# Patient Record
Sex: Male | Born: 1950 | Race: White | Hispanic: No | Smoking: Never smoker
Health system: Southern US, Community
[De-identification: ages and names within clinical notes are randomized; demographics above are authoritative.]

## PROBLEM LIST (undated history)

## (undated) DIAGNOSIS — I119 Hypertensive 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 heart failure: Secondary | ICD-10-CM

## (undated) DIAGNOSIS — D649 Anemia, unspecified: Secondary | ICD-10-CM

## (undated) DIAGNOSIS — T8859XA Other complications of anesthesia, initial encounter: Secondary | ICD-10-CM

## (undated) DIAGNOSIS — R112 Nausea with vomiting, unspecified: Secondary | ICD-10-CM

## (undated) DIAGNOSIS — N183 Chronic kidney disease, stage 3 (moderate): Secondary | ICD-10-CM

## (undated) DIAGNOSIS — I4891 Unspecified atrial fibrillation: Secondary | ICD-10-CM

## (undated) DIAGNOSIS — I219 Acute myocardial infarction, unspecified: Secondary | ICD-10-CM

## (undated) DIAGNOSIS — G629 Polyneuropathy, unspecified: Secondary | ICD-10-CM

## (undated) DIAGNOSIS — T4145XA Adverse effect of unspecified anesthetic, initial encounter: Secondary | ICD-10-CM

## (undated) DIAGNOSIS — E7800 Pure hypercholesterolemia, unspecified: Secondary | ICD-10-CM

## (undated) DIAGNOSIS — Z9889 Other specified postprocedural states: Secondary | ICD-10-CM

## (undated) DIAGNOSIS — R319 Hematuria, unspecified: Secondary | ICD-10-CM

## (undated) DIAGNOSIS — G459 Transient cerebral ischemic attack, unspecified: Secondary | ICD-10-CM

## (undated) DIAGNOSIS — Z9289 Personal history of other medical treatment: Secondary | ICD-10-CM

## (undated) DIAGNOSIS — Z86718 Personal history of other venous thrombosis and embolism: Secondary | ICD-10-CM

## (undated) DIAGNOSIS — I10 Essential (primary) hypertension: Secondary | ICD-10-CM

## (undated) HISTORY — DX: Hematuria, unspecified: R31.9

## (undated) HISTORY — PX: CATARACT EXTRACTION W/ INTRAOCULAR LENS IMPLANT: SHX1309

## (undated) HISTORY — PX: LUMBAR DISC SURGERY: SHX700

## (undated) HISTORY — PX: NASAL SEPTUM SURGERY: SHX37

## (undated) HISTORY — DX: Chronic kidney disease, stage 3 (moderate): N18.3

## (undated) HISTORY — PX: FRACTURE SURGERY: SHX138

## (undated) HISTORY — PX: COLONOSCOPY: SHX174

## (undated) HISTORY — DX: Polyneuropathy, unspecified: G62.9

## (undated) HISTORY — PX: ORIF TIBIA FRACTURE: SHX5416

## (undated) HISTORY — PX: BACK SURGERY: SHX140

## (undated) HISTORY — DX: Personal history of other venous thrombosis and embolism: Z86.718

## (undated) HISTORY — DX: Hypertensive 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 heart failure: I11.9

## (undated) HISTORY — PX: ORIF ULNAR FRACTURE: SHX5417

## (undated) SURGICAL SUPPLY — 2 items
SOLN 0.9% NACL 1000 ML (IV SOLUTION) ×1 IMPLANT
SOLN STERILE WATER 1000 ML (IV SOLUTION) ×1 IMPLANT

---
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
## 1998-05-31 ENCOUNTER — Inpatient Hospital Stay (HOSPITAL_COMMUNITY): Admission: EM | Admit: 1998-05-31 | Discharge: 1998-06-05 | Payer: Self-pay | Admitting: Emergency Medicine

## 1998-05-31 ENCOUNTER — Encounter: Payer: Self-pay | Admitting: General Surgery

## 1998-05-31 ENCOUNTER — Encounter: Payer: Self-pay | Admitting: Emergency Medicine

## 1998-06-01 ENCOUNTER — Encounter: Payer: Self-pay | Admitting: Physical Medicine & Rehabilitation

## 1998-06-01 ENCOUNTER — Encounter: Payer: Self-pay | Admitting: General Surgery

## 1998-06-02 ENCOUNTER — Encounter: Payer: Self-pay | Admitting: General Surgery

## 1998-06-03 ENCOUNTER — Encounter: Payer: Self-pay | Admitting: Physical Medicine & Rehabilitation

## 1998-06-05 ENCOUNTER — Inpatient Hospital Stay (HOSPITAL_COMMUNITY)
Admission: RE | Admit: 1998-06-05 | Discharge: 1998-06-12 | Payer: Self-pay | Admitting: Physical Medicine and Rehabilitation

## 2015-04-19 DIAGNOSIS — Z86718 Personal history of other venous thrombosis and embolism: Secondary | ICD-10-CM

## 2015-04-19 HISTORY — DX: Personal history of other venous thrombosis and embolism: Z86.718

## 2015-10-28 ENCOUNTER — Emergency Department (HOSPITAL_COMMUNITY)
Admission: EM | Admit: 2015-10-28 | Discharge: 2015-10-29 | Disposition: A | Discharge status: Home / Self Care | Payer: Self-pay | Attending: Emergency Medicine | Admitting: Emergency Medicine

## 2015-10-28 ENCOUNTER — Encounter (HOSPITAL_COMMUNITY): Payer: Self-pay | Admitting: Nurse Practitioner

## 2015-10-28 DIAGNOSIS — M7989 Other specified soft tissue disorders: Secondary | ICD-10-CM | POA: Insufficient documentation

## 2015-10-28 DIAGNOSIS — M79661 Pain in right lower leg: Secondary | ICD-10-CM

## 2015-10-28 MED ORDER — ENOXAPARIN SODIUM 80 MG/0.8ML ~~LOC~~ SOLN
1.0000 mg/kg | Freq: Once | SUBCUTANEOUS | Status: AC
  Administered 2015-10-29: 80 mg via SUBCUTANEOUS
  Filled 2015-10-28: qty 0.8

## 2015-10-28 NOTE — ED Notes (Addendum)
Pt c/o RLE swelling, pain, tightness behind his calf. He went to an urgent care and they sent him here to R/o DVT. He is alert, breathing easily, denies other complaints.

## 2015-10-28 NOTE — ED Provider Notes (Signed)
CSN: [id number]     Arrival date & time 10/28/15  1808 History   First MD Initiated Contact with Patient 10/28/15 2314     Chief Complaint  Patient presents with  . Leg Swelling     (Consider location/radiation/quality/duration/timing/severity/associated sxs/prior Treatment) HPI Comments: Patient presents today with pain and swelling of the right lower leg x 2 days.  Symptoms gradually worsening.  He has not taken anything for symptoms prior to arrival.  He denies any injury or trauma to the leg.  He was seen at [facility] earlier today and sent to the ED due to concern for DVT.  He denies any prior history of DVT or PE.  Denies prolonged travel or surgeries in the past 4 weeks.  Denies chest pain, SOB, hemoptysis, fever, or chills.     The history is provided by the patient.    No past medical history on file. No past surgical history on file. No family history on file. Social History  Substance Use Topics  . Smoking status: Never Smoker   . Smokeless tobacco: Not on file  . Alcohol Use: No    Review of Systems  All other systems reviewed and are negative.     Allergies  Review of patient's allergies indicates no known allergies.  Home Medications   Prior to Admission medications   Not on File   BP 160/84 mmHg  Pulse 79  Temp(Src) 98.5 F (36.9 C) (Oral)  Resp 18  Ht 6\' 2"  (1.88 m)  Wt 81.647 kg  BMI 23.10 kg/m2  SpO2 99% Physical Exam  Constitutional: He appears well-developed and well-nourished.  HENT:  Head: Normocephalic and atraumatic.  Neck: Normal range of motion. Neck supple.  Cardiovascular: Normal rate, regular rhythm and normal heart sounds.   Pulses:      Dorsalis pedis pulses are 2+ on the right side, and 2+ on the left side.  Pulmonary/Chest: Effort normal and breath sounds normal.  Musculoskeletal: Normal range of motion.  RLE swelling and tenderness to palpation of the right calf Positive Homan's sign on the right. No erythema or warmth of the RLE   Neurological: He is alert.  Skin: Skin is warm and dry.  Psychiatric: He has a normal mood and affect.  Nursing note and vitals reviewed.   ED Course  Procedures (including critical care time) Labs Review Labs Reviewed - No data to display  Imaging Review No results found. I have personally reviewed and evaluated these images and lab results as part of my medical decision-making.   EKG Interpretation None      MDM   Final diagnoses:  None   Patient presents today with RLE pain and swelling.  No acute injury or trauma.  No signs of infection.  Neurovascularly intact.  Unable to get a Doppler ultrasound to rule out a DVT at this time due to the ultrasound tech being gone for the night.  Doppler ultrasound ordered for the morning.  Patient given IM Lovenox.  He denies CP or SOB.  VSS.  Stable for discharge.  Return precautions given.     Hyman Bible, PA-C [id number]  Delora Fuel, MD [id number]

## 2015-10-29 ENCOUNTER — Ambulatory Visit (HOSPITAL_COMMUNITY)
Admission: RE | Admit: 2015-10-29 | Discharge: 2015-10-29 | Disposition: A | Discharge status: Home / Self Care | Payer: Self-pay | Source: Ambulatory Visit | Attending: Physician Assistant | Admitting: Physician Assistant

## 2015-10-29 ENCOUNTER — Encounter (HOSPITAL_COMMUNITY): Payer: Self-pay

## 2015-10-29 ENCOUNTER — Other Ambulatory Visit (HOSPITAL_COMMUNITY): Payer: Self-pay | Admitting: Emergency Medicine

## 2015-10-29 ENCOUNTER — Emergency Department (HOSPITAL_COMMUNITY)
Admission: EM | Admit: 2015-10-29 | Discharge: 2015-10-29 | Disposition: A | Discharge status: Home / Self Care | Payer: Self-pay | Attending: Emergency Medicine | Admitting: Emergency Medicine

## 2015-10-29 DIAGNOSIS — I82401 Acute embolism and thrombosis of unspecified deep veins of right lower extremity: Secondary | ICD-10-CM | POA: Insufficient documentation

## 2015-10-29 DIAGNOSIS — M7989 Other specified soft tissue disorders: Principal | ICD-10-CM

## 2015-10-29 DIAGNOSIS — M79604 Pain in right leg: Secondary | ICD-10-CM | POA: Insufficient documentation

## 2015-10-29 DIAGNOSIS — M79601 Pain in right arm: Secondary | ICD-10-CM

## 2015-10-29 DIAGNOSIS — N179 Acute kidney failure, unspecified: Secondary | ICD-10-CM | POA: Insufficient documentation

## 2015-10-29 LAB — BASIC METABOLIC PANEL
ANION GAP: 12 (ref 5–15)
BUN: 29 mg/dL — AB (ref 6–20)
CO2: 15 mmol/L — ABNORMAL LOW (ref 22–32)
Calcium: 8.8 mg/dL — ABNORMAL LOW (ref 8.9–10.3)
Chloride: 116 mmol/L — ABNORMAL HIGH (ref 101–111)
Creatinine, Ser: 1.89 mg/dL — ABNORMAL HIGH (ref 0.61–1.24)
GFR calc Af Amer: 42 mL/min — ABNORMAL LOW (ref >60)
GFR, EST NON AFRICAN AMERICAN: 36 mL/min — AB (ref >60)
Glucose, Bld: 113 mg/dL — ABNORMAL HIGH (ref 65–99)
POTASSIUM: 3.6 mmol/L (ref 3.5–5.1)
SODIUM: 143 mmol/L (ref 135–145)

## 2015-10-29 LAB — CBC WITH DIFFERENTIAL/PLATELET
BASOS ABS: 0.1 10*3/uL (ref 0.0–0.1)
Basophils Relative: 1 %
EOS ABS: 0.3 10*3/uL (ref 0.0–0.7)
EOS PCT: 3 %
HCT: 41.4 % (ref 39.0–52.0)
Hemoglobin: 13.9 g/dL (ref 13.0–17.0)
Lymphocytes Relative: 18 %
Lymphs Abs: 1.4 10*3/uL (ref 0.7–4.0)
MCH: 29.7 pg (ref 26.0–34.0)
MCHC: 33.6 g/dL (ref 30.0–36.0)
MCV: 88.5 fL (ref 78.0–100.0)
Monocytes Absolute: 0.5 10*3/uL (ref 0.1–1.0)
Monocytes Relative: 6 %
Neutro Abs: 5.8 10*3/uL (ref 1.7–7.7)
Neutrophils Relative %: 72 %
PLATELETS: 197 10*3/uL (ref 150–400)
RBC: 4.68 MIL/uL (ref 4.22–5.81)
RDW: 14.3 % (ref 11.5–15.5)
WBC: 8.1 10*3/uL (ref 4.0–10.5)

## 2015-10-29 MED ORDER — APIXABAN 5 MG PO TABS: 5.0000 mg | ORAL_TABLET | Freq: Two times a day (BID) | ORAL | Status: DC

## 2015-10-29 MED ORDER — APIXABAN 5 MG PO TABS
10.0000 mg | ORAL_TABLET | Freq: Two times a day (BID) | ORAL | Status: DC
  Administered 2015-10-29: 10 mg via ORAL
  Filled 2015-10-29: qty 2

## 2015-10-29 MED ORDER — SODIUM CHLORIDE 0.9 % IV BOLUS (SEPSIS)
1000.0000 mL | Freq: Once | INTRAVENOUS | Status: AC
  Administered 2015-10-29: 1000 mL via INTRAVENOUS

## 2015-10-29 MED ORDER — APIXABAN 5 MG PO TABS
10.0000 mg | ORAL_TABLET | Freq: Two times a day (BID) | ORAL | Status: DC
  Filled 2015-10-29: qty 2

## 2015-10-29 NOTE — Discharge Planning (Signed)
Case Management Note  Patient Details  Name: Billy [name] MRN: [id number] Date of Birth: 03/11/1951  Subjective/Objective:                  65 yo pt presents today for a DVT. He was here yesterday for leg swelling and pain. He came back today and received an ultrasound to confirm a DVT. /Bounces between mother and sister house.  Action/Plan: Follow for disposition needs. /Medication assistance.   Expected Discharge Date:  10/29/15               Expected Discharge Plan:  Home/Self Care  In-House Referral:  NA  Discharge planning Services  CM Consult, Follow-up appt scheduled, GCCN / P4HM (established/new)  Post Acute Care Choice:  NA Choice offered to:  Patient  DME Arranged:  N/A DME Agency:  NA  HH Arranged:  NA HH Agency:  NA  Status of Service:  Completed, signed off  If discussed at [place] of Stay Meetings, dates discussed:    Additional Comments: EDCM consulted to regarding medication assistance for Eliquis.  Pt is not working and is currently helping sister take care of mother.  Avid runner who until recently has not had any health issues.  EDCM presented pt with brochure with 30 day free card and refill assistance card intact for Eliquis. Pt will have upcoming appointment at [facility] on 8/4 at 1130.    Fuller Mandril, RN 10/29/2015, 11:23 AM

## 2015-10-29 NOTE — Discharge Instructions (Addendum)
Please follow up with vascular surgeon next week for further evaluation and management of your blood clot in your right leg. Find a primary care provider by using resources provided for you.  Make sure to take your medication as prescribed.  Return if you developed chest pain, shortness of breath or if you have other concerns.      Information on my medicine - ELIQUIS (apixaban)  This medication education was reviewed with me or my healthcare representative as part of my discharge preparation.    Why was Eliquis prescribed for you? Eliquis was prescribed to treat blood clots that may have been found in the veins of your legs (deep vein thrombosis) or in your lungs (pulmonary embolism) and to reduce the risk of them occurring again.  What do You need to know about Eliquis ? The starting dose is 10 mg (two 5 mg tablets) taken TWICE daily for the FIRST SEVEN (7) DAYS, then on 7/20  the dose is reduced to ONE 5 mg tablet taken TWICE daily.  Eliquis may be taken with or without food.   Try to take the dose about the same time in the morning and in the evening. If you have difficulty swallowing the tablet whole please discuss with your pharmacist how to take the medication safely.  Take Eliquis exactly as prescribed and DO NOT stop taking Eliquis without talking to the doctor who prescribed the medication.  Stopping may increase your risk of developing a new blood clot.  Refill your prescription before you run out.  After discharge, you should have regular check-up appointments with your healthcare provider that is prescribing your Eliquis.    What do you do if you miss a dose? If a dose of ELIQUIS is not taken at the scheduled time, take it as soon as possible on the same day and twice-daily administration should be resumed. The dose should not be doubled to make up for a missed dose.  Important Safety Information A possible side effect of Eliquis is bleeding. You should call your  healthcare provider right away if you experience any of the following: ? Bleeding from an injury or your nose that does not stop. ? Unusual colored urine (red or dark brown) or unusual colored stools (red or black). ? Unusual bruising for unknown reasons. ? A serious fall or if you hit your head (even if there is no bleeding).  Some medicines may interact with Eliquis and might increase your risk of bleeding or clotting while on Eliquis. To help avoid this, consult your healthcare provider or pharmacist prior to using any new prescription or non-prescription medications, including herbals, vitamins, non-steroidal anti-inflammatory drugs (NSAIDs) and supplements.  This website has more information on Eliquis (apixaban): http://www.eliquis.com/eliquis/home   Venous Thromboembolism Venous thromboembolism (VTE) is a condition in which a blood clot (thrombus) develops in the body. A thrombus usually occurs in a deep vein in the leg or the pelvis, but it can also occur in the arm. Sometimes, pieces of a thrombus can break off from its original place of development and travel through the bloodstream to other parts of the body. When that happens, the thrombus is called an embolism. An embolism can block the blood flow in the blood vessels of other organs. There are two serious types of VTE:  Deep vein thrombosis (DVT). A DVT is a thrombus that usually occurs in a deep, larger vein of the lower leg or the pelvis, or in an upper extremity such as the arm.  Pulmonary embolism (PE). A PE occurs when an embolism has formed and traveled to the lungs. A PE can block or decrease the blood flow in one lung or both lungs. VTE is a serious health condition that can cause disability or death. It is very important to get help right away and to not ignore symptoms. CAUSES VTE is caused by the formation of a blood clot in your leg, pelvis, or arm. Usually, several things contribute to the formation of blood clots. A  clot may develop when:  Your blood flow slows down.  Your vein becomes damaged in some way.  You have a condition that makes your blood clot more easily. RISK FACTORS A VTE is more likely to develop in:  People who are older, especially over 18 years of age.  People who are overweight (obese).  People who sit or lie still for a long time, such as during long-distance travel (over 4 hours), bed rest, hospitalization, or during recovery from certain medical conditions like a stroke.  People who do not engage in much physical activity (sedentary lifestyle).  People who have chronic breathing disorders.  People who have a personal or family history of blood clots or blood clotting disease.  People who have peripheral vascular disease (PVD), diabetes, or some types of cancer.  People who have heart disease, especially if the person had a recent heart attack or has congestive heart failure.  People who have neurological diseases that affect the legs (leg paresis).  People who have had a traumatic injury, such as breaking a hip or leg.  People who have recently had major or lengthy surgery, especially on the hip, knee, or abdomen.  People who have had a central line placed inside a large vein.  People who take medicines that contain the hormone estrogen. These include birth control pills and hormone replacement therapy.  Pregnancy or during childbirth or the postpartum period.  Long plane flights (over 8 hours). SIGNS AND SYMPTOMS  Symptoms of VTE can depend on where the clot is located and whether the clot breaks off and travels to another organ. Sometimes, there may be no symptoms. Symptoms of a DVT can include:  Swelling of your leg or arm, especially if one side is much worse.  Warmth and redness of your leg or arm, especially if one side is much worse.  Pain in your arm or leg. If the clot is in your leg, symptoms may be more noticeable or worse when you stand or  walk.  A feeling of pins and needles if the clot is in the arm. The symptoms of a PE usually start suddenly and include:  Shortness of breath while active or at rest.  Coughing or coughing up blood or blood-tinged mucus.  Chest pain that is often worse with deep breaths.  Rapid or irregular heartbeat.  Feeling light-headed or dizzy.  Fainting.  Feeling anxious.  Sweating. There may also be pain and swelling in a leg if that is where the blood clot started. These symptoms may represent a serious problem that is an emergency. Do not wait to see if the symptoms will go away. Get medical help right away. Call your local emergency services (911 in the [place] Do not drive yourself to the hospital. DIAGNOSIS Your health care provider will take a medical history and perform a physical exam. You may also have other tests, including:  Blood tests to assess the clotting properties of your blood.  Imaging tests, such as CT,  ultrasound, MRI, X-ray, and other tests to see if you have clots anywhere in your body.  An electrocardiogram (ECG) to look for heart strain from blood clots in the lungs.  An echocardiogram. TREATMENT After a VTE is identified, it can be treated. The main goals of treatment are:  To stop a blood clot from growing larger.  To stop new blood clots from forming.  To stop a blood clot from traveling to the lungs (pulmonary embolism). The type of treatment that you receive depends on many factors, such as the cause of your VTE, your risk for bleeding or developing more clots, and other medical conditions that you have. Sometimes, a combination of treatments is necessary. Treatment options may be combined and include:  Monitoring the blood clot with ultrasound.  Taking medicines by mouth, such as newer blood thinners (anticoagulants), thrombolytics, or warfarin.  Taking anticoagulant medicine by injection or through an IV tube.  Wearingcompression stockings or  using different types of devices.  Surgery (rare) to remove the blood clot or to place a filter in your abdomen to stop the blood clot from traveling to your lungs. Treatments for VTE are often divided into immediate treatment and long-term treatment (up to 3 months after VTE). You can work with your health care provider to choose the treatment program that is best for you. HOME CARE INSTRUCTIONS If you are taking a newer oral anticoagulant:  Take the medicine every single day at the same time each day.  Understand what foods and drugs interact with this medicine.  Understand that there are no regular blood tests required when using this medicine.  Understand the side effects of this medicine, including excessive bruising or bleeding. Ask your health care provider or pharmacist about other possible side effects. If you are taking warfarin:  Understand how to take warfarin and know which foods can affect how warfarin works in Veterinary surgeon.  Understand that it is dangerous to take too much or too little warfarin. Too much warfarin increases the risk of bleeding. Too little warfarin continues to allow the risk for blood clots.  Follow your PT and INR blood testing schedule. The PT and INR results allow your health care provider to adjust your dose of warfarin. It is very important that you have your PT and INR tested as often as told by your health care provider.  Avoid major changes in your diet, or tell your health care provider before you change your diet. Arrange a visit with a registered dietitian to answer your questions. Many foods, especially foods that are high in vitamin K, can interfere with warfarin and affect the PT and INR results. Eat a consistent amount of foods that are high in vitamin K, such as:  Spinach, kale, broccoli, cabbage, collard greens, turnip greens, Brussels sprouts, peas, cauliflower, seaweed, and parsley.  Beef liver and pork liver.  Green tea.  Soybean  oil.  Tell your health care provider about any and all medicines, vitamins, and supplements that you take, including aspirin and other over-the-counter anti-inflammatory medicines. Be especially cautious with aspirin and anti-inflammatory medicines. Do not take those before you ask your health care provider if it is safe to do so. This is important because many medicines can interfere with warfarin and affect the PT and INR results.  Do not start or stop taking any over-the-counter or prescription medicine unless your health care provider or pharmacist tells you to do so. If you take warfarin, you will also need to do  these things:  Hold pressure over cuts for longer than usual.  Tell your dentist and other health care providers that you are taking warfarin before you have any procedures in which bleeding may occur.  Avoid alcohol or drink very small amounts. Tell your health care provider if you change your alcohol intake.  Do not use tobacco products, including cigarettes, chewing tobacco, and e-cigarettes. If you need help quitting, ask your health care provider.  Avoid contact sports. General Instructions  Take over-the-counter and prescription medicines only as told by your health care provider. Anticoagulant medicines can have side effects, including easy bruising and difficulty stopping bleeding. If you are prescribed an anticoagulant, you will also need to do these things:  Hold pressure over cuts for longer than usual.  Tell your dentist and other health care providers that you are taking anticoagulants before you have any procedures in which bleeding may occur.  Avoid contact sports.  Wear a medical alert bracelet or carry a medical alert card that says you have had a PE.  Ask your health care provider how soon you can go back to your normal activities. Stay active to prevent new blood clots from forming.  Make sure to exercise while traveling or when you have been sitting or  standing for a long period of time. It is very important to exercise. Exercise your legs by walking or by tightening and relaxing your leg muscles often. Take frequent walks.  Wear compression stockings as told by your health care provider to help prevent more blood clots from forming.  Do not use tobacco products, including cigarettes, chewing tobacco, and e-cigarettes. If you need help quitting, ask your health care provider.  Keep all follow-up appointments with your health care provider. This is important. PREVENTION Take these actions to decrease your risk of developing another VTE:  Exercise regularly. For at least 30 minutes every day, engage in:  Activity that involves moving your arms and legs.  Activity that encourages good blood flow through your body by increasing your heart rate.  Exercise your arms and legs every hour during long-distance travel (over 4 hours). Drink plenty of water and avoid drinking alcohol while traveling.  Avoid sitting or lying in bed for long periods of time without moving your legs.  Maintain a weight that is appropriate for your height. Ask your health care provider what weight is healthy for you.  If you are a woman who is over 13 years of age, avoid unnecessary use of medicines that contain estrogen. These include birth control pills.  Do not smoke, especially if you take estrogen medicines. If you need help quitting, ask your health care provider. If you are hospitalized, prevention measures may include:  Early walking after surgery, as soon as your health care provider says that it is safe.  Receiving anticoagulants to prevent blood clots.If you cannot take anticoagulants, other options may be available, such as wearing compression stockings or using different types of devices. SEEK IMMEDIATE MEDICAL CARE IF:  You have new or increased pain, swelling, or redness in an arm or leg.  You have numbness or tingling in an arm or leg.  You have  shortness of breath while active or at rest.  You have chest pain.  You have a rapid or irregular heartbeat.  You feel light-headed or dizzy.  You cough up blood.  You notice blood in your vomit, bowel movement, or urine. These symptoms may represent a serious problem that is an emergency. Do  not wait to see if the symptoms will go away. Get medical help right away. Call your local emergency services (911 in the [place] Do not drive yourself to the hospital.   This information is not intended to replace advice given to you by your health care provider. Make sure you discuss any questions you have with your health care provider.   Document Released: 01/30/2009 Document Revised: 12/24/2014 Document Reviewed: 07/30/2014 Elsevier Interactive Patient Education [employer].

## 2015-10-29 NOTE — ED Notes (Signed)
Pt. Presents today for a DVT. He was here yesterday for leg swelling and pain. He came back today and received an ultrasound to confirm a DVT. Pt. R lower leg and foot is swollen still and some pallor noted. Pt. Denies pain at this time.

## 2015-10-29 NOTE — Progress Notes (Addendum)
*  Preliminary Results* Bilateral lower extremity venous duplex completed. The right lower extremity is positive for acute occlusive deep vein thrombosis involving the right distal femoral vein, right popliteal vein, right posterior tibial veins, right peroneal veins, and right gastrocnemius veins. There is no obvious evidence of left lower extremity deep vein thrombosis or bilateral Baker's cyst.  Patient will be brought to the emergency department for treatment.  Of note: patient seemed to be short of breath while talking. When asked if this is something new he stated, "well, I've been under a lot of stress lately."  10/29/2015 .8:34 AM  Maudry Mayhew, RVT, RDCS, RDMS

## 2015-10-29 NOTE — Progress Notes (Signed)
Spoke to patient with [facility] regarding follow up care and the [facility] orange card. Follow up appointment made with the [facility] for Friday Aug 4,2017 @ 11:30am, pt verbalized understanding. Patient also informed of the upcoming orange card enrollment at the clinic and was instructed to bring his completed application Thursday July 20,17 @8 :00am to obtain the orange card. My contact information provided for any future questions or concerns. No other [place] Specialist needs identified at this time.   [place] Specialist [id number] [phone]

## 2015-10-29 NOTE — Discharge Instructions (Signed)
It is recommended that you come to the hospital tomorrow to have an ultrasound of your leg to rule out a blood clot.  If the ultrasound is negative, it is recommended that you elevate your leg above your heart and wear compression stockings to help with the swelling.  Return to the ER immediately if you become short of breath or develop chest pain.

## 2015-10-29 NOTE — ED Provider Notes (Signed)
CSN: [id number]     Arrival date & time 10/29/15  [id number] History   First MD Initiated Contact with Patient 10/29/15 [phone]     No chief complaint on file.    (Consider location/radiation/quality/duration/timing/severity/associated sxs/prior Treatment) HPI 65 year old male without any significant past medical history presenting for evaluation of right leg swelling. Patient reports gradual onset of persistent pain and swelling and tightness to his right lower leg ongoing for the past 3 days. Pain is mildly improved with taking Advil. He takes roughly 8 Advil daily. Currently rating his pain as 3 of 10. No associated fever, chills, chest pain, short of breath, productive cough, hemoptysis, back pain on numbness. He denies any prior history of PE or DVT, no recent surgery, prolonged bed rest, active cancer, or recent trauma. He is currently taking care of his mother who is sick and has been under a lot of stress. States that he has been sleeping in a recliner instead of his bed. Patient also mentioned he is an avid runner but haven't been running lately due to having to take care of his mom. He was seen at urgent care yesterday and was recommended to come to the ER for further valuation. He was seen in the ED last night and was told to return today for a DVT study of his right leg. He has had the DVT study performed this morning and shows a positive DVT. He is here for further management.    No past medical history on file. No past surgical history on file. No family history on file. Social History  Substance Use Topics  . Smoking status: Never Smoker   . Smokeless tobacco: Not on file  . Alcohol Use: No    Review of Systems  All other systems reviewed and are negative.     Allergies  Review of patient's allergies indicates no known allergies.  Home Medications   Prior to Admission medications   Not on File   BP 139/75 mmHg  Pulse 69  Temp(Src) 98.3 F (36.8 C) (Oral)  Resp 18  SpO2  98% Physical Exam  Constitutional: He appears well-developed and well-nourished. No distress.  HENT:  Head: Atraumatic.  Eyes: Conjunctivae are normal.  Neck: Neck supple.  Cardiovascular: Normal rate, regular rhythm and intact distal pulses.   Pulmonary/Chest: Effort normal and breath sounds normal.  Abdominal: Soft. There is no tenderness.  Musculoskeletal: He exhibits edema (Right lower extremity: 1+ pitting Edema noted throughout right lower leg with faint petechial skin changes to the anterior t tib-fib. Dorsalis pedis and posterior tibialis pulses palpable) and tenderness (Tenderness to the right posterior calf on palpation no palpable cord appreciated.).  Neurological: He is alert.  Skin: No rash noted.  Psychiatric: He has a normal mood and affect.  Nursing note and vitals reviewed.   ED Course  Procedures (including critical care time)  Author: Elmo Putt Service: Vascular Lab Author Type: Cardiovascular Sonographer    Filed: 10/29/2015 8:46 AM Note Time: 10/29/2015 8:32 AM Status: Addendum   Editor: Doyne Keel Simonetti (Cardiovascular Sonographer)     Related Notes: Original Note by Elmo Putt (Cardiovascular Sonographer) filed at 10/29/2015 8:35 AM   Expand All Collapse All   *Preliminary Results* Bilateral lower extremity venous duplex completed. The right lower extremity is positive for acute occlusive deep vein thrombosis involving the right distal femoral vein, right popliteal vein, right posterior tibial veins, right peroneal veins, and right gastrocnemius veins. There is no obvious evidence of left lower  extremity deep vein thrombosis or bilateral Baker's cyst.  Patient will be brought to the emergency department for treatment.  Of note: patient seemed to be short of breath while talking. When asked if this is something new he stated, "well, I've been under a lot of stress lately."  10/29/2015 .8:34 AM  Maudry Mayhew, RVT, RDCS, RDMS        Labs Review Labs Reviewed  BASIC METABOLIC PANEL - Abnormal; Notable for the following:    Chloride 116 (*)    CO2 15 (*)    Glucose, Bld 113 (*)    BUN 29 (*)    Creatinine, Ser 1.89 (*)    Calcium 8.8 (*)    GFR calc non Af Amer 36 (*)    GFR calc Af Amer 42 (*)    All other components within normal limits  CBC WITH DIFFERENTIAL/PLATELET    Imaging Review No results found. I have personally reviewed and evaluated these images and lab results as part of my medical decision-making.   EKG Interpretation None      MDM   Final diagnoses:  DVT, lower extremity, right  AKI (acute kidney injury) ([place])    BP 139/75 mmHg  Pulse 69  Temp(Src) 98.3 F (36.8 C) (Oral)  Resp 18  SpO2 98%   10:25 AM Patient here with right lower shin and he edema and pain. Vascular ultrasound demonstrated evidence of a DVT. No signs of phlegmasia. No systemic manifestation no chest pains or numbness of breath concerning for PE at this time. Patient is afebrile vital signs stable. Labs remarkable for a creatinine of 1.89 with a BUN of 29 likely reflecting either acute renal injury   andrenal insufficiency. No prior values for comparison. Patient does take NSAIDs on a regular basis which can potentially cause renal injury.   I have discussed care with ER pharmacist who recommend starting patient on Eliquis.  I will also consult our Case Manager to help pt with outpt resources for close follow up.  Will give referral to vascular surgery for outpatient care.  Care discussed with Dr. Vanita Panda.   12:48 PM Case manager have seen and offer pt [employer], and provide outpt resources.  All questions answer to patient's satisfaction.  Eliquis card provided by Pharmacist.  IVF given for AKI.  Recommend decrease NSAIDs use.  Return precaution discussed.    Domenic Moras, PA-C 10/29/15 [place], MD 10/29/15 [phone]

## 2015-10-30 ENCOUNTER — Telehealth: Payer: Self-pay | Admitting: *Deleted

## 2015-11-20 ENCOUNTER — Encounter: Payer: Self-pay | Admitting: Internal Medicine

## 2015-11-20 ENCOUNTER — Ambulatory Visit (INDEPENDENT_AMBULATORY_CARE_PROVIDER_SITE_OTHER): Payer: Self-pay | Admitting: Internal Medicine

## 2015-11-20 VITALS — BP 160/82 | HR 64 | Resp 18 | Ht 70.0 in | Wt 183.0 lb

## 2015-11-20 DIAGNOSIS — I82401 Acute embolism and thrombosis of unspecified deep veins of right lower extremity: Secondary | ICD-10-CM

## 2015-11-20 DIAGNOSIS — Z23 Encounter for immunization: Secondary | ICD-10-CM

## 2015-11-20 MED ORDER — APIXABAN 5 MG PO TABS: 5.0000 mg | ORAL_TABLET | Freq: Two times a day (BID) | ORAL | 6 refills | Status: DC

## 2015-11-20 NOTE — Patient Instructions (Addendum)
Call on Wednesday morning regarding Eliquis if you have not heard from [place]. We will set up follow up when call
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
## 2015-11-23 ENCOUNTER — Telehealth: Payer: Self-pay | Admitting: Internal Medicine

## 2015-11-24 ENCOUNTER — Other Ambulatory Visit: Payer: Self-pay

## 2015-11-24 ENCOUNTER — Telehealth: Payer: Self-pay | Admitting: Internal Medicine

## 2015-11-24 MED ORDER — APIXABAN 5 MG PO TABS: 5.0000 mg | ORAL_TABLET | Freq: Two times a day (BID) | ORAL | 4 refills | Status: DC

## 2015-11-24 MED ORDER — APIXABAN 5 MG PO TABS: 5.0000 mg | ORAL_TABLET | Freq: Two times a day (BID) | ORAL | 6 refills | Status: DC

## 2015-11-24 NOTE — Telephone Encounter (Signed)
Wants Dr. Amil Amen to review medical history to see if he is medically able to serve jury duty.  Patient has a lot going on with taking care of his mother and blood clot in leg, just not feeling to go serve.  Patient can be reached at [phone].

## 2015-11-24 NOTE — Telephone Encounter (Signed)
To MD

## 2015-11-25 ENCOUNTER — Encounter: Payer: Self-pay | Admitting: Internal Medicine

## 2015-11-25 NOTE — Telephone Encounter (Signed)
I will write an excuse.  If he has a particular form that needs to be filled out, he needs to bring in.

## 2015-12-01 ENCOUNTER — Telehealth: Payer: Self-pay | Admitting: Internal Medicine

## 2015-12-01 NOTE — Telephone Encounter (Signed)
Patient scheduled to come on on 12/03/15 for Fasting Labs.  Patient will also bring Jury Duty Form for  Dr. Amil Amen to review.

## 2015-12-03 ENCOUNTER — Encounter: Payer: Self-pay | Admitting: Internal Medicine

## 2015-12-03 ENCOUNTER — Other Ambulatory Visit (INDEPENDENT_AMBULATORY_CARE_PROVIDER_SITE_OTHER): Payer: Self-pay

## 2015-12-03 VITALS — BP 152/66 | HR 62

## 2015-12-03 DIAGNOSIS — I82401 Acute embolism and thrombosis of unspecified deep veins of right lower extremity: Secondary | ICD-10-CM

## 2015-12-03 DIAGNOSIS — Z13220 Encounter for screening for lipoid disorders: Secondary | ICD-10-CM

## 2015-12-04 LAB — COMPREHENSIVE METABOLIC PANEL
ALBUMIN: 4.2 g/dL (ref 3.6–4.8)
ALT: 19 IU/L (ref 0–44)
AST: 27 IU/L (ref 0–40)
Albumin/Globulin Ratio: 1.8 (ref 1.2–2.2)
Alkaline Phosphatase: 40 IU/L (ref 39–117)
BUN / CREAT RATIO: 17 (ref 10–24)
BUN: 33 mg/dL — ABNORMAL HIGH (ref 8–27)
Bilirubin Total: <0.2 mg/dL (ref 0.0–1.2)
CALCIUM: 9.9 mg/dL (ref 8.6–10.2)
CO2: 17 mmol/L — AB (ref 18–29)
CREATININE: 1.96 mg/dL — AB (ref 0.76–1.27)
Chloride: 113 mmol/L — ABNORMAL HIGH (ref 96–106)
GFR calc Af Amer: 41 mL/min/{1.73_m2} — ABNORMAL LOW (ref >59)
GFR, EST NON AFRICAN AMERICAN: 36 mL/min/{1.73_m2} — AB (ref >59)
GLOBULIN, TOTAL: 2.3 g/dL (ref 1.5–4.5)
Glucose: 92 mg/dL (ref 65–99)
Potassium: 4.7 mmol/L (ref 3.5–5.2)
SODIUM: 150 mmol/L — AB (ref 134–144)
Total Protein: 6.5 g/dL (ref 6.0–8.5)

## 2015-12-04 LAB — LIPID PANEL W/O CHOL/HDL RATIO
CHOLESTEROL TOTAL: 198 mg/dL (ref 100–199)
HDL: 39 mg/dL — ABNORMAL LOW (ref >39)
LDL CALC: 140 mg/dL — AB (ref 0–99)
TRIGLYCERIDES: 94 mg/dL (ref 0–149)
VLDL CHOLESTEROL CAL: 19 mg/dL (ref 5–40)

## 2015-12-18 ENCOUNTER — Other Ambulatory Visit (INDEPENDENT_AMBULATORY_CARE_PROVIDER_SITE_OTHER): Payer: Self-pay

## 2015-12-18 DIAGNOSIS — Z79899 Other long term (current) drug therapy: Secondary | ICD-10-CM

## 2015-12-18 DIAGNOSIS — N289 Disorder of kidney and ureter, unspecified: Secondary | ICD-10-CM

## 2015-12-19 LAB — BASIC METABOLIC PANEL
BUN/Creatinine Ratio: 21 (ref 10–24)
BUN: 33 mg/dL — ABNORMAL HIGH (ref 8–27)
CALCIUM: 9.8 mg/dL (ref 8.6–10.2)
CHLORIDE: 107 mmol/L — AB (ref 96–106)
CO2: 23 mmol/L (ref 18–29)
Creatinine, Ser: 1.54 mg/dL — ABNORMAL HIGH (ref 0.76–1.27)
GFR calc Af Amer: 55 mL/min/{1.73_m2} — ABNORMAL LOW (ref >59)
GFR calc non Af Amer: 48 mL/min/{1.73_m2} — ABNORMAL LOW (ref >59)
Glucose: 90 mg/dL (ref 65–99)
POTASSIUM: 4.7 mmol/L (ref 3.5–5.2)
SODIUM: 144 mmol/L (ref 134–144)

## 2016-01-08 ENCOUNTER — Telehealth: Payer: Self-pay

## 2016-01-08 NOTE — Telephone Encounter (Signed)
I called Mr. Bokor on 01/07/2016 to let him know about his renal ultrasound scheduled for 01/13/2016 at [facility]. While he was on the phone he informed me that he has ran out of his Eliquis samples that he received from the [facility] program on Sunday 01/03/2016. His application has been a problem at [facility] with the manufacturer saying they have not received it. [facility] says today that they have another 30 day supply of samples for him to pick up today and will be refaxing the application for a third time today. I called the patient to inform him to go to the health department and pick up his samples and he said he would in a little while.

## 2016-01-13 ENCOUNTER — Ambulatory Visit (HOSPITAL_COMMUNITY): Payer: Self-pay

## 2016-02-24 NOTE — Telephone Encounter (Signed)
Labs were completed as noted on schedule.

## 2017-03-20 ENCOUNTER — Other Ambulatory Visit: Payer: Self-pay

## 2017-03-20 ENCOUNTER — Emergency Department (HOSPITAL_COMMUNITY)
Admission: EM | Admit: 2017-03-20 | Discharge: 2017-03-21 | Disposition: A | Discharge status: Home / Self Care | Payer: BLUE CROSS/BLUE SHIELD | Attending: Emergency Medicine | Admitting: Emergency Medicine

## 2017-03-20 ENCOUNTER — Encounter (HOSPITAL_COMMUNITY): Payer: Self-pay | Admitting: Emergency Medicine

## 2017-03-20 DIAGNOSIS — I10 Essential (primary) hypertension: Secondary | ICD-10-CM | POA: Diagnosis present

## 2017-03-20 LAB — CBC WITH DIFFERENTIAL/PLATELET
BASOS PCT: 1 %
Basophils Absolute: 0 10*3/uL (ref 0.0–0.1)
EOS PCT: 4 %
Eosinophils Absolute: 0.3 10*3/uL (ref 0.0–0.7)
HEMATOCRIT: 41.7 % (ref 39.0–52.0)
Hemoglobin: 14 g/dL (ref 13.0–17.0)
Lymphocytes Relative: 33 %
Lymphs Abs: 2.4 10*3/uL (ref 0.7–4.0)
MCH: 27.2 pg (ref 26.0–34.0)
MCHC: 33.6 g/dL (ref 30.0–36.0)
MCV: 81 fL (ref 78.0–100.0)
Monocytes Absolute: 0.6 10*3/uL (ref 0.1–1.0)
Monocytes Relative: 8 %
NEUTROS ABS: 4.2 10*3/uL (ref 1.7–7.7)
Neutrophils Relative %: 56 %
PLATELETS: 262 10*3/uL (ref 150–400)
RBC: 5.15 MIL/uL (ref 4.22–5.81)
RDW: 14.7 % (ref 11.5–15.5)
WBC: 7.5 10*3/uL (ref 4.0–10.5)

## 2017-03-20 LAB — URINALYSIS, ROUTINE W REFLEX MICROSCOPIC
BACTERIA UA: NONE SEEN
Bilirubin Urine: NEGATIVE
Glucose, UA: NEGATIVE mg/dL
KETONES UR: NEGATIVE mg/dL
Leukocytes, UA: NEGATIVE
Nitrite: NEGATIVE
Protein, ur: 100 mg/dL — AB
Specific Gravity, Urine: 1.02 (ref 1.005–1.030)
pH: 5 (ref 5.0–8.0)

## 2017-03-20 LAB — COMPREHENSIVE METABOLIC PANEL
ALBUMIN: 3.5 g/dL (ref 3.5–5.0)
ALT: 27 U/L (ref 17–63)
AST: 34 U/L (ref 15–41)
Alkaline Phosphatase: 45 U/L (ref 38–126)
Anion gap: 12 (ref 5–15)
BUN: 33 mg/dL — AB (ref 6–20)
CHLORIDE: 102 mmol/L (ref 101–111)
CO2: 26 mmol/L (ref 22–32)
Calcium: 9.8 mg/dL (ref 8.9–10.3)
Creatinine, Ser: 1.8 mg/dL — ABNORMAL HIGH (ref 0.61–1.24)
GFR calc Af Amer: 44 mL/min — ABNORMAL LOW (ref >60)
GFR calc non Af Amer: 38 mL/min — ABNORMAL LOW (ref >60)
GLUCOSE: 127 mg/dL — AB (ref 65–99)
POTASSIUM: 3.1 mmol/L — AB (ref 3.5–5.1)
Sodium: 140 mmol/L (ref 135–145)
Total Bilirubin: 0.3 mg/dL (ref 0.3–1.2)
Total Protein: 6.8 g/dL (ref 6.5–8.1)

## 2017-03-20 NOTE — ED Notes (Signed)
ED Provider at bedside. 

## 2017-03-20 NOTE — ED Triage Notes (Addendum)
Patient reports elevated blood pressure at home this evening BP=233/113 with mild dizziness , advised by his PCP to go to ER for treatment . S. Petrucelli PA notified on pt.'s hypertension .

## 2017-03-21 MED ORDER — HYDRALAZINE HCL 20 MG/ML IJ SOLN
10.0000 mg | Freq: Once | INTRAMUSCULAR | Status: AC
  Administered 2017-03-21: 10 mg via INTRAVENOUS
  Filled 2017-03-21: qty 1

## 2017-03-21 MED ORDER — LISINOPRIL 20 MG PO TABS: 20.0000 mg | ORAL_TABLET | Freq: Every day | ORAL | 0 refills | Status: DC

## 2017-03-21 MED ORDER — LISINOPRIL 10 MG PO TABS
10.0000 mg | ORAL_TABLET | Freq: Once | ORAL | Status: AC
  Administered 2017-03-21: 10 mg via ORAL
  Filled 2017-03-21: qty 1

## 2017-03-21 MED ORDER — HYDROCHLOROTHIAZIDE 25 MG PO TABS
25.0000 mg | ORAL_TABLET | Freq: Every day | ORAL | Status: DC
  Administered 2017-03-21: 25 mg via ORAL
  Filled 2017-03-21: qty 1

## 2017-03-21 NOTE — Discharge Instructions (Signed)
You were seen today for high blood pressure.  Your blood pressure readings continue to be high.  However given that you are asymptomatic, you should start on blood pressure medications and follow-up with your primary doctor on Thursday as scheduled.  If you develop chest pain, headache or any new or worsening symptoms, you should be re-evaluated.
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
## 2017-03-21 NOTE — ED Notes (Signed)
Pt verbalizes understanding of d/c instructions. Pt received prescriptions. Pt ambulatory at d/c with all belongings.  

## 2017-04-20 ENCOUNTER — Encounter: Payer: Self-pay | Admitting: Cardiology

## 2017-04-20 DIAGNOSIS — I119 Hypertensive 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 heart failure: Secondary | ICD-10-CM

## 2017-04-20 DIAGNOSIS — I82409 Acute embolism and thrombosis of unspecified deep veins of unspecified lower extremity: Secondary | ICD-10-CM | POA: Insufficient documentation

## 2017-04-20 DIAGNOSIS — N183 Chronic kidney disease, stage 3 unspecified: Secondary | ICD-10-CM

## 2017-04-20 DIAGNOSIS — N1832 Chronic kidney disease, stage 3b: Secondary | ICD-10-CM | POA: Insufficient documentation

## 2017-04-20 DIAGNOSIS — R319 Hematuria, unspecified: Secondary | ICD-10-CM | POA: Insufficient documentation

## 2017-04-20 DIAGNOSIS — Z86718 Personal history of other venous thrombosis and embolism: Secondary | ICD-10-CM | POA: Insufficient documentation

## 2017-04-20 HISTORY — DX: Hematuria, unspecified: R31.9

## 2017-04-20 HISTORY — DX: Hypertensive 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 heart failure: I11.9

## 2017-04-20 HISTORY — DX: Chronic kidney disease, stage 3 unspecified: N18.30

## 2017-04-25 ENCOUNTER — Other Ambulatory Visit: Payer: Self-pay | Admitting: Cardiology

## 2017-04-26 ENCOUNTER — Other Ambulatory Visit: Payer: Self-pay | Admitting: Cardiology

## 2017-05-01 ENCOUNTER — Other Ambulatory Visit: Payer: Self-pay | Admitting: Cardiology

## 2017-05-01 ENCOUNTER — Ambulatory Visit (HOSPITAL_COMMUNITY)
Admission: RE | Admit: 2017-05-01 | Discharge: 2017-05-01 | Disposition: A | Discharge status: Home / Self Care | Payer: BLUE CROSS/BLUE SHIELD | Source: Ambulatory Visit | Attending: Surgery | Admitting: Surgery

## 2017-05-01 DIAGNOSIS — I701 Atherosclerosis of renal artery: Secondary | ICD-10-CM | POA: Diagnosis not present

## 2017-05-01 DIAGNOSIS — I10 Essential (primary) hypertension: Secondary | ICD-10-CM

## 2017-05-01 DIAGNOSIS — I119 Hypertensive 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 heart failure: Secondary | ICD-10-CM | POA: Diagnosis not present

## 2017-05-11 ENCOUNTER — Other Ambulatory Visit: Payer: Self-pay | Admitting: Cardiology

## 2017-05-11 DIAGNOSIS — I119 Hypertensive 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 heart failure: Secondary | ICD-10-CM

## 2017-05-15 ENCOUNTER — Ambulatory Visit
Admission: RE | Admit: 2017-05-15 | Discharge: 2017-05-15 | Disposition: A | Discharge status: Home / Self Care | Payer: BLUE CROSS/BLUE SHIELD | Source: Ambulatory Visit | Attending: Cardiology | Admitting: Cardiology

## 2017-05-15 DIAGNOSIS — I119 Hypertensive 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 heart failure: Secondary | ICD-10-CM

## 2017-06-13 ENCOUNTER — Other Ambulatory Visit: Payer: Self-pay | Admitting: Family Medicine

## 2017-06-13 DIAGNOSIS — M7989 Other specified soft tissue disorders: Secondary | ICD-10-CM

## 2017-06-14 ENCOUNTER — Other Ambulatory Visit: Payer: BLUE CROSS/BLUE SHIELD

## 2017-06-29 ENCOUNTER — Encounter (HOSPITAL_BASED_OUTPATIENT_CLINIC_OR_DEPARTMENT_OTHER): Payer: Self-pay | Admitting: Emergency Medicine

## 2017-06-29 ENCOUNTER — Other Ambulatory Visit: Payer: Self-pay

## 2017-06-29 ENCOUNTER — Emergency Department (HOSPITAL_BASED_OUTPATIENT_CLINIC_OR_DEPARTMENT_OTHER)
Admission: EM | Admit: 2017-06-29 | Discharge: 2017-06-29 | Disposition: A | Discharge status: Home / Self Care | Payer: BLUE CROSS/BLUE SHIELD | Attending: Emergency Medicine | Admitting: Emergency Medicine

## 2017-06-29 ENCOUNTER — Emergency Department (HOSPITAL_BASED_OUTPATIENT_CLINIC_OR_DEPARTMENT_OTHER): Payer: BLUE CROSS/BLUE SHIELD

## 2017-06-29 DIAGNOSIS — R0989 Other specified symptoms and signs involving the circulatory and respiratory systems: Secondary | ICD-10-CM | POA: Insufficient documentation

## 2017-06-29 DIAGNOSIS — I129 Hypertensive chronic kidney disease with stage 1 through stage 4 chronic kidney disease, or unspecified chronic kidney disease: Secondary | ICD-10-CM | POA: Insufficient documentation

## 2017-06-29 DIAGNOSIS — J029 Acute pharyngitis, unspecified: Secondary | ICD-10-CM | POA: Diagnosis present

## 2017-06-29 DIAGNOSIS — Z79899 Other long term (current) drug therapy: Secondary | ICD-10-CM | POA: Diagnosis not present

## 2017-06-29 DIAGNOSIS — R112 Nausea with vomiting, unspecified: Secondary | ICD-10-CM | POA: Diagnosis not present

## 2017-06-29 DIAGNOSIS — N183 Chronic kidney disease, stage 3 (moderate): Secondary | ICD-10-CM | POA: Insufficient documentation

## 2017-06-29 DIAGNOSIS — T17308A Unspecified foreign body in larynx causing other injury, initial encounter: Secondary | ICD-10-CM

## 2017-06-29 HISTORY — DX: Essential (primary) hypertension: I10

## 2017-06-29 LAB — COMPREHENSIVE METABOLIC PANEL
ALBUMIN: 3.8 g/dL (ref 3.5–5.0)
ALK PHOS: 40 U/L (ref 38–126)
ALT: 28 U/L (ref 17–63)
AST: 29 U/L (ref 15–41)
Anion gap: 9 (ref 5–15)
BUN: 30 mg/dL — AB (ref 6–20)
CALCIUM: 9.4 mg/dL (ref 8.9–10.3)
CO2: 27 mmol/L (ref 22–32)
CREATININE: 2.2 mg/dL — AB (ref 0.61–1.24)
Chloride: 107 mmol/L (ref 101–111)
GFR calc Af Amer: 35 mL/min — ABNORMAL LOW (ref >60)
GFR calc non Af Amer: 30 mL/min — ABNORMAL LOW (ref >60)
GLUCOSE: 139 mg/dL — AB (ref 65–99)
Potassium: 4 mmol/L (ref 3.5–5.1)
Sodium: 143 mmol/L (ref 135–145)
Total Bilirubin: 0.7 mg/dL (ref 0.3–1.2)
Total Protein: 6.8 g/dL (ref 6.5–8.1)

## 2017-06-29 LAB — CBC WITH DIFFERENTIAL/PLATELET
BASOS PCT: 0 %
Basophils Absolute: 0 10*3/uL (ref 0.0–0.1)
Eosinophils Absolute: 0 10*3/uL (ref 0.0–0.7)
Eosinophils Relative: 0 %
HEMATOCRIT: 42.7 % (ref 39.0–52.0)
HEMOGLOBIN: 14.3 g/dL (ref 13.0–17.0)
Lymphocytes Relative: 7 %
Lymphs Abs: 1.2 10*3/uL (ref 0.7–4.0)
MCH: 28.3 pg (ref 26.0–34.0)
MCHC: 33.5 g/dL (ref 30.0–36.0)
MCV: 84.4 fL (ref 78.0–100.0)
MONOS PCT: 9 %
Monocytes Absolute: 1.7 10*3/uL — ABNORMAL HIGH (ref 0.1–1.0)
NEUTROS ABS: 15.2 10*3/uL — AB (ref 1.7–7.7)
NEUTROS PCT: 84 %
Platelets: 245 10*3/uL (ref 150–400)
RBC: 5.06 MIL/uL (ref 4.22–5.81)
RDW: 15.4 % (ref 11.5–15.5)
WBC: 18.1 10*3/uL — ABNORMAL HIGH (ref 4.0–10.5)

## 2017-06-29 LAB — PROTIME-INR
INR: 1.1
Prothrombin Time: 14.1 seconds (ref 11.4–15.2)

## 2017-06-29 MED ORDER — GI COCKTAIL ~~LOC~~
30.0000 mL | Freq: Once | ORAL | Status: AC
  Administered 2017-06-29: 30 mL via ORAL
  Filled 2017-06-29: qty 30

## 2017-06-29 MED ORDER — ONDANSETRON HCL 4 MG/2ML IJ SOLN
4.0000 mg | Freq: Once | INTRAMUSCULAR | Status: AC
  Administered 2017-06-29: 4 mg via INTRAVENOUS
  Filled 2017-06-29: qty 2

## 2017-06-29 MED ORDER — HYDRALAZINE HCL 25 MG PO TABS
100.0000 mg | ORAL_TABLET | Freq: Once | ORAL | Status: AC
  Administered 2017-06-29: 100 mg via ORAL
  Filled 2017-06-29: qty 4

## 2017-06-29 MED ORDER — SODIUM CHLORIDE 0.9 % IV BOLUS (SEPSIS)
1000.0000 mL | Freq: Once | INTRAVENOUS | Status: AC
  Administered 2017-06-29: 1000 mL via INTRAVENOUS

## 2017-06-29 MED ORDER — DEXAMETHASONE SODIUM PHOSPHATE 10 MG/ML IJ SOLN
10.0000 mg | Freq: Once | INTRAMUSCULAR | Status: AC
  Administered 2017-06-29: 10 mg via INTRAVENOUS
  Filled 2017-06-29: qty 1

## 2017-06-29 MED ORDER — CARVEDILOL 25 MG PO TABS
25.0000 mg | ORAL_TABLET | Freq: Two times a day (BID) | ORAL | Status: DC
  Filled 2017-06-29: qty 1

## 2017-06-29 NOTE — ED Notes (Signed)
Pt became very anxious, standing up, yelling, "I can't breathe!" O2 sats noted to be 100% on RA at the time; pt kept saying he couldn't breathe, then was unable to talk; O2 sats noted to drop from 100% to 75%, then back up to 100% in the next minute. EDP notified.
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
## 2017-06-29 NOTE — ED Triage Notes (Signed)
Pt had cataract removal to [facility] yesterday. Pt reports multiple episodes of violent vomiting after eating a pizza. Pt states after this, he feels like something is stuck in his throat and has had difficulty swallowing.

## 2017-06-29 NOTE — ED Notes (Signed)
Pt is eating chicken noodle soup.

## 2017-06-29 NOTE — Discharge Instructions (Signed)
Your workup today did not show evidence of esophageal perforation but we suspect you have inflammation in your throat and esophagus from all of the vomiting.  The steroids we gave you will help with the inflammation.  Please use your home nausea medicine to help prevent vomiting.  Please stay hydrated and follow-up with your primary doctor in several days.  You are able to demonstrate tolerating her medications and we feel you are safe.  If any symptoms change or worsen or recur, please return to the nearest emergency department.

## 2017-06-29 NOTE — ED Notes (Signed)
ED Provider at bedside. 

## 2017-06-29 NOTE — ED Notes (Signed)
Pt tolerating soup w/o difficulty

## 2017-06-29 NOTE — ED Notes (Signed)
Pt tolerating sips of water; plan is to try a small amt of a a soft food to see if pt can tolerate.

## 2017-06-30 ENCOUNTER — Other Ambulatory Visit: Payer: Self-pay | Admitting: Nephrology

## 2017-06-30 DIAGNOSIS — N183 Chronic kidney disease, stage 3 unspecified: Secondary | ICD-10-CM

## 2017-06-30 DIAGNOSIS — I824Y9 Acute embolism and thrombosis of unspecified deep veins of unspecified proximal lower extremity: Secondary | ICD-10-CM

## 2017-06-30 DIAGNOSIS — I129 Hypertensive chronic kidney disease with stage 1 through stage 4 chronic kidney disease, or unspecified chronic kidney disease: Secondary | ICD-10-CM

## 2017-07-20 ENCOUNTER — Inpatient Hospital Stay
Admission: RE | Admit: 2017-07-20 | Discharge: 2017-07-20 | Disposition: A | Discharge status: Home / Self Care | Payer: BLUE CROSS/BLUE SHIELD | Source: Ambulatory Visit | Attending: Nephrology | Admitting: Nephrology

## 2017-07-27 ENCOUNTER — Ambulatory Visit
Admission: RE | Admit: 2017-07-27 | Discharge: 2017-07-27 | Disposition: A | Discharge status: Home / Self Care | Payer: BLUE CROSS/BLUE SHIELD | Source: Ambulatory Visit | Attending: Nephrology | Admitting: Nephrology

## 2017-07-27 DIAGNOSIS — N183 Chronic kidney disease, stage 3 unspecified: Secondary | ICD-10-CM

## 2017-07-27 DIAGNOSIS — I129 Hypertensive chronic kidney disease with stage 1 through stage 4 chronic kidney disease, or unspecified chronic kidney disease: Secondary | ICD-10-CM

## 2017-07-27 DIAGNOSIS — I824Y9 Acute embolism and thrombosis of unspecified deep veins of unspecified proximal lower extremity: Secondary | ICD-10-CM

## 2017-08-03 ENCOUNTER — Encounter: Payer: Self-pay | Admitting: Surgery

## 2017-08-03 ENCOUNTER — Other Ambulatory Visit: Payer: Self-pay | Admitting: *Deleted

## 2017-08-03 ENCOUNTER — Encounter: Payer: Self-pay | Admitting: *Deleted

## 2017-08-03 ENCOUNTER — Ambulatory Visit: Payer: BLUE CROSS/BLUE SHIELD | Admitting: Surgery

## 2017-08-03 ENCOUNTER — Other Ambulatory Visit: Payer: Self-pay

## 2017-08-03 VITALS — BP 169/80 | HR 62 | Temp 97.2°F | Resp 18 | Ht 74.0 in | Wt 165.0 lb

## 2017-08-03 DIAGNOSIS — I701 Atherosclerosis of renal artery: Secondary | ICD-10-CM | POA: Diagnosis not present
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
## 2017-08-08 ENCOUNTER — Encounter: Payer: Self-pay | Admitting: Nephrology

## 2017-08-22 ENCOUNTER — Encounter (HOSPITAL_COMMUNITY)
Admission: AD | Disposition: A | Discharge status: Home / Self Care | Payer: Self-pay | Source: Ambulatory Visit | Attending: Surgery

## 2017-08-22 ENCOUNTER — Ambulatory Visit (HOSPITAL_COMMUNITY)
Admission: AD | Admit: 2017-08-22 | Discharge: 2017-08-22 | Disposition: A | Discharge status: Home / Self Care | Payer: BLUE CROSS/BLUE SHIELD | Source: Ambulatory Visit | Attending: Surgery | Admitting: Surgery

## 2017-08-22 DIAGNOSIS — Z86718 Personal history of other venous thrombosis and embolism: Secondary | ICD-10-CM | POA: Insufficient documentation

## 2017-08-22 DIAGNOSIS — I1310 Hypertensive heart and chronic kidney disease without heart failure, with stage 1 through stage 4 chronic kidney disease, or unspecified chronic kidney disease: Secondary | ICD-10-CM | POA: Diagnosis not present

## 2017-08-22 DIAGNOSIS — N183 Chronic kidney disease, stage 3 (moderate): Secondary | ICD-10-CM | POA: Diagnosis not present

## 2017-08-22 DIAGNOSIS — Z79899 Other long term (current) drug therapy: Secondary | ICD-10-CM | POA: Diagnosis not present

## 2017-08-22 DIAGNOSIS — Z7901 Long term (current) use of anticoagulants: Secondary | ICD-10-CM | POA: Diagnosis not present

## 2017-08-22 DIAGNOSIS — I701 Atherosclerosis of renal artery: Secondary | ICD-10-CM | POA: Diagnosis not present

## 2017-08-22 DIAGNOSIS — Z87828 Personal history of other (healed) physical injury and trauma: Secondary | ICD-10-CM | POA: Diagnosis not present

## 2017-08-22 HISTORY — PX: ABDOMINAL AORTOGRAM: CATH118222

## 2017-08-22 LAB — POCT I-STAT, CHEM 8
BUN: 34 mg/dL — ABNORMAL HIGH (ref 6–20)
Calcium, Ion: 1.21 mmol/L (ref 1.15–1.40)
Chloride: 108 mmol/L (ref 101–111)
Creatinine, Ser: 1.9 mg/dL — ABNORMAL HIGH (ref 0.61–1.24)
Glucose, Bld: 100 mg/dL — ABNORMAL HIGH (ref 65–99)
HEMATOCRIT: 37 % — AB (ref 39.0–52.0)
HEMOGLOBIN: 12.6 g/dL — AB (ref 13.0–17.0)
POTASSIUM: 3.8 mmol/L (ref 3.5–5.1)
Sodium: 141 mmol/L (ref 135–145)
TCO2: 24 mmol/L (ref 22–32)

## 2017-08-22 SURGERY — ABDOMINAL AORTOGRAM
Anesthesia: LOCAL

## 2017-08-22 MED ORDER — LABETALOL HCL 5 MG/ML IV SOLN: 10.0000 mg | INTRAVENOUS | Status: DC | PRN

## 2017-08-22 MED ORDER — HEPARIN (PORCINE) IN NACL 2-0.9 UNITS/ML
INTRAMUSCULAR | Status: AC | PRN
  Administered 2017-08-22: 1000 mL via INTRA_ARTERIAL

## 2017-08-22 MED ORDER — SODIUM CHLORIDE 0.9 % WEIGHT BASED INFUSION: 1.0000 mL/kg/h | INTRAVENOUS | Status: DC

## 2017-08-22 MED ORDER — ONDANSETRON HCL 4 MG/2ML IJ SOLN: 4.0000 mg | Freq: Four times a day (QID) | INTRAMUSCULAR | Status: DC | PRN

## 2017-08-22 MED ORDER — HYDRALAZINE HCL 20 MG/ML IJ SOLN: 5.0000 mg | INTRAMUSCULAR | Status: DC | PRN

## 2017-08-22 MED ORDER — SODIUM CHLORIDE 0.9% FLUSH: 3.0000 mL | INTRAVENOUS | Status: DC | PRN

## 2017-08-22 MED ORDER — ACETAMINOPHEN 325 MG PO TABS: 650.0000 mg | ORAL_TABLET | ORAL | Status: DC | PRN

## 2017-08-22 MED ORDER — LIDOCAINE HCL (PF) 1 % IJ SOLN
INTRAMUSCULAR | Status: DC | PRN
  Administered 2017-08-22: 15 mL

## 2017-08-22 MED ORDER — SODIUM CHLORIDE 0.9% FLUSH: 3.0000 mL | Freq: Two times a day (BID) | INTRAVENOUS | Status: DC

## 2017-08-22 MED ORDER — HEPARIN (PORCINE) IN NACL 1000-0.9 UT/500ML-% IV SOLN
INTRAVENOUS | Status: AC
  Filled 2017-08-22: qty 500

## 2017-08-22 MED ORDER — LIDOCAINE HCL (PF) 1 % IJ SOLN
INTRAMUSCULAR | Status: AC
  Filled 2017-08-22: qty 30

## 2017-08-22 MED ORDER — HYDRALAZINE HCL 20 MG/ML IJ SOLN
INTRAMUSCULAR | Status: AC
  Filled 2017-08-22: qty 1

## 2017-08-22 MED ORDER — IODIXANOL 320 MG/ML IV SOLN
INTRAVENOUS | Status: DC | PRN
  Administered 2017-08-22: 10 mg via INTRAVENOUS

## 2017-08-22 MED ORDER — SODIUM CHLORIDE 0.9 % IV SOLN: 250.0000 mL | INTRAVENOUS | Status: DC | PRN

## 2017-08-22 MED ORDER — HYDRALAZINE HCL 20 MG/ML IJ SOLN
INTRAMUSCULAR | Status: DC | PRN
  Administered 2017-08-22: 10 mg via INTRAVENOUS

## 2017-08-22 MED ORDER — SODIUM CHLORIDE 0.9 % IV SOLN
INTRAVENOUS | Status: DC
  Administered 2017-08-22: 08:00:00 via INTRAVENOUS

## 2017-08-22 SURGICAL SUPPLY — 20 items
CATH OMNI FLUSH 5F 65CM (CATHETERS) ×1 IMPLANT
COVER PRB 48X5XTLSCP FOLD TPE (BAG) IMPLANT
COVER PROBE 5X48 (BAG) ×2
DEVICE CLOSURE MYNXGRIP 6/7F (Vascular Products) ×1 IMPLANT
DRAPE ZERO GRAVITY STERILE (DRAPES) ×1 IMPLANT
FILTER CO2 0.2 MICRON (VASCULAR PRODUCTS) ×1 IMPLANT
GUIDE CATH VISTA JR4 6F (CATHETERS) ×1 IMPLANT
KIT ENCORE 26 ADVANTAGE (KITS) ×1 IMPLANT
KIT MICROPUNCTURE NIT STIFF (SHEATH) ×1 IMPLANT
KIT PV (KITS) ×2 IMPLANT
RESERVOIR CO2 (VASCULAR PRODUCTS) ×1 IMPLANT
SET FLUSH CO2 (MISCELLANEOUS) ×1 IMPLANT
SHEATH AVANTI 11CM 6FR (SHEATH) ×1 IMPLANT
STOPCOCK MORSE 400PSI 3WAY (MISCELLANEOUS) ×1 IMPLANT
SYR MEDRAD MARK V 150ML (SYRINGE) ×2 IMPLANT
TRANSDUCER W/STOPCOCK (MISCELLANEOUS) ×2 IMPLANT
TRAY PV CATH (CUSTOM PROCEDURE TRAY) ×2 IMPLANT
TUBING CIL FLEX 10 FLL-RA (TUBING) ×1 IMPLANT
WIRE BENTSON .035X145CM (WIRE) ×1 IMPLANT
WIRE SPARTACORE .014X190CM (WIRE) ×1 IMPLANT

## 2017-08-22 NOTE — Op Note (Signed)
    Patient name: Billy [name] MRN: [id number] DOB: 1950-08-14 Sex: male  08/22/2017 Pre-operative Diagnosis: renal artery stenosis Post-operative diagnosis:  Same Surgeon:  Annamarie Major Procedure Performed:  1.  Ultrasound-guided access, right femoral artery  2.  Abdominal aortogram with CO2  3.  Closure device (mynx)    Indications: The patient is difficult to control blood pressure as well as renal insufficiency.  He had an ultrasound that showed a discrepancy in the kidney size between the left and the right and possible left renal artery stenosis.  He is here today for further evaluation.  Procedure:  The patient was identified in the holding area and taken to room 8.  The patient was then placed supine on the table and prepped and draped in the usual sterile fashion.  A time out was called.  Ultrasound was used to evaluate the right common femoral artery.  It was patent .  A digital ultrasound image was acquired.  A micropuncture needle was used to access the right common femoral artery under ultrasound guidance.  An 018 wire was advanced without resistance and a micropuncture sheath was placed.  The 018 wire was removed and a benson wire was placed.  The micropuncture sheath was exchanged for a 6 french sheath.  An omniflush catheter was advanced over the wire to the level of L-1.  An abdominal aortogram was performed in multiple obliquities with CO2.  Next, a JR4 catheter was inserted and selected contrast imaging was performed.  A total of 10 cc of contrast was utilized. Findings:   Aortogram: The infrarenal abdominal aorta is widely patent.  The celiac and superior mesenteric arteries appear to be patent.  The ostium of the vessels are not evaluated.  The right renal artery is widely patent.  The left renal artery is not visualized, suggesting occlusion.   Intervention: None  Impression:  #1  Nonvisualization of left renal artery, indicating occlusion   V. Annamarie Major,  M.D. [facility] Vein Specialists of [place] Office: [phone] Pager:  [phone]

## 2017-08-22 NOTE — Interval H&P Note (Signed)
History and Physical Interval Note:  08/22/2017 7:22 AM  Billy [name]  has presented today for surgery, with the diagnosis of Renal Artery Stenosis  The various methods of treatment have been discussed with the patient and family. After consideration of risks, benefits and other options for treatment, the patient has consented to  Procedure(s): ABDOMINAL AORTOGRAM (N/A) as a surgical intervention .  The patient's history has been reviewed, patient examined, no change in status, stable for surgery.  I have reviewed the patient's chart and labs.  Questions were answered to the patient's satisfaction.     Annamarie Major

## 2017-08-22 NOTE — Discharge Instructions (Signed)
**Note Billy [name]-identified via Obfuscation** Femoral Site Care °Refer to this sheet in the next few weeks. These instructions provide you with information about caring for yourself after your procedure. Your health care provider may also give you more specific instructions. Your treatment has been planned according to current medical practices, but problems sometimes occur. Call your health care provider if you have any problems or questions after your procedure. °What can I expect after the procedure? °After your procedure, it is typical to have the following: °· Bruising at the site that usually fades within 1-2 weeks. °· Blood collecting in the tissue (hematoma) that may be painful to the touch. It should usually decrease in size and tenderness within 1-2 weeks. ° °Follow these instructions at home: °· Take medicines only as directed by your health care provider. °· You may shower 24-48 hours after the procedure or as directed by your health care provider. Remove the bandage (dressing) and gently wash the site with plain soap and water. Pat the area dry with a clean towel. Do not rub the site, because this may cause bleeding. °· Do not take baths, swim, or use a hot tub until your health care provider approves. °· Check your insertion site every day for redness, swelling, or drainage. °· Do not apply powder or lotion to the site. °· Limit use of stairs to twice a day for the first 2-3 days or as directed by your health care provider. °· Do not squat for the first 2-3 days or as directed by your health care provider. °· Do not lift over 10 lb (4.5 kg) for 5 days after your procedure or as directed by your health care provider. °· Ask your health care provider when it is okay to: °? Return to work or school. °? Resume usual physical activities or sports. °? Resume sexual activity. °· Do not drive home if you are discharged the same day as the procedure. Have someone else drive you. °· You may drive 24 hours after the procedure unless otherwise instructed by  your health care provider. °· Do not operate machinery or power tools for 24 hours after the procedure or as directed by your health care provider. °· If your procedure was done as an outpatient procedure, which means that you went home the same day as your procedure, a responsible adult should be with you for the first 24 hours after you arrive home. °· Keep all follow-up visits as directed by your health care provider. This is important. °Contact a health care provider if: °· You have a fever. °· You have chills. °· You have increased bleeding from the site. Hold pressure on the site. °Get help right away if: °· You have unusual pain at the site. °· You have redness, warmth, or swelling at the site. °· You have drainage (other than a small amount of blood on the dressing) from the site. °· The site is bleeding, and the bleeding does not stop after 30 minutes of holding steady pressure on the site. °· Your leg or foot becomes pale, cool, tingly, or numb. °This information is not intended to replace advice given to you by your health care provider. Make sure you discuss any questions you have with your health care provider. °Document Released: 12/06/2013 Document Revised: 09/10/2015 Document Reviewed: 10/22/2013 °Elsevier Interactive Patient Education © 2018 [employer] ° °

## 2017-08-23 ENCOUNTER — Encounter (HOSPITAL_COMMUNITY): Payer: Self-pay | Admitting: Surgery

## 2017-08-23 MED FILL — Heparin Sod (Porcine)-NaCl IV Soln 1000 Unit/500ML-0.9%: INTRAVENOUS | Qty: 1000 | Status: AC

## 2017-11-16 DIAGNOSIS — G459 Transient cerebral ischemic attack, unspecified: Secondary | ICD-10-CM

## 2017-11-16 HISTORY — DX: Transient cerebral ischemic attack, unspecified: G45.9

## 2017-11-21 ENCOUNTER — Ambulatory Visit: Payer: BLUE CROSS/BLUE SHIELD | Admitting: Neurology

## 2017-11-21 ENCOUNTER — Encounter: Payer: Self-pay | Admitting: Neurology

## 2017-11-21 VITALS — BP 125/70 | HR 64 | Ht 74.0 in | Wt 162.5 lb

## 2017-11-21 DIAGNOSIS — R202 Paresthesia of skin: Secondary | ICD-10-CM
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
## 2017-11-21 NOTE — Patient Instructions (Signed)
We will get EMG and NCV study to look at the nerve function of the legs.

## 2017-11-24 ENCOUNTER — Telehealth: Payer: Self-pay | Admitting: Neurology

## 2017-11-24 LAB — MULTIPLE MYELOMA PANEL, SERUM
ALBUMIN SERPL ELPH-MCNC: 3.4 g/dL (ref 2.9–4.4)
ALBUMIN/GLOB SERPL: 1.4 (ref 0.7–1.7)
ALPHA 1: 0.2 g/dL (ref 0.0–0.4)
Alpha2 Glob SerPl Elph-Mcnc: 0.7 g/dL (ref 0.4–1.0)
B-GLOBULIN SERPL ELPH-MCNC: 0.9 g/dL (ref 0.7–1.3)
GAMMA GLOB SERPL ELPH-MCNC: 0.7 g/dL (ref 0.4–1.8)
GLOBULIN, TOTAL: 2.5 g/dL (ref 2.2–3.9)
IGG (IMMUNOGLOBIN G), SERUM: 736 mg/dL (ref 700–1600)
IgA/Immunoglobulin A, Serum: 152 mg/dL (ref 61–437)
IgM (Immunoglobulin M), Srm: 96 mg/dL (ref 20–172)
Total Protein: 5.9 g/dL — ABNORMAL LOW (ref 6.0–8.5)

## 2017-11-24 LAB — B. BURGDORFI ANTIBODIES: Lyme IgG/IgM Ab: <0.91 {ISR} (ref 0.00–0.90)

## 2017-11-24 LAB — SEDIMENTATION RATE: Sed Rate: 4 mm/hr (ref 0–30)

## 2017-11-24 LAB — ANA W/REFLEX: ANA: NEGATIVE

## 2017-11-24 LAB — ANGIOTENSIN CONVERTING ENZYME: Angio Convert Enzyme: 26 U/L (ref 14–82)

## 2017-11-24 LAB — VITAMIN B12: Vitamin B-12: 583 pg/mL (ref 232–1245)

## 2017-11-24 MED ORDER — GABAPENTIN 100 MG PO CAPS: 200.0000 mg | ORAL_CAPSULE | Freq: Every day | ORAL | 1 refills | Status: DC

## 2017-11-24 NOTE — Telephone Encounter (Signed)
The gabapentin prescription was sent in.

## 2017-11-24 NOTE — Telephone Encounter (Signed)
Pt has been taking gabapentin (NEURONTIN) 100 MG capsule prescribed by PCP office. He will run out on Monday of next week and is wanting Dr Jannifer Franklin to refill for him. He takes 200mg  at night. He is requesting a refill to be sent to [facility]. He is aware the clinic closes at noon today.

## 2017-11-27 ENCOUNTER — Telehealth: Payer: Self-pay

## 2017-11-27 NOTE — Telephone Encounter (Signed)
Kathrynn Ducking, MD  [facility], [place]        The blood work results are unremarkable. Please call the patient.     Patient aware of results by phone.

## 2017-12-12 ENCOUNTER — Encounter (HOSPITAL_COMMUNITY): Payer: Self-pay

## 2017-12-12 ENCOUNTER — Observation Stay (HOSPITAL_COMMUNITY): Payer: BLUE CROSS/BLUE SHIELD

## 2017-12-12 ENCOUNTER — Inpatient Hospital Stay (HOSPITAL_COMMUNITY)
Admission: EM | Admit: 2017-12-12 | Discharge: 2017-12-14 | DRG: 040 | Disposition: A | Discharge status: Home / Self Care | Payer: BLUE CROSS/BLUE SHIELD | Attending: Family Medicine | Admitting: Family Medicine

## 2017-12-12 ENCOUNTER — Ambulatory Visit (HOSPITAL_BASED_OUTPATIENT_CLINIC_OR_DEPARTMENT_OTHER): Payer: BLUE CROSS/BLUE SHIELD

## 2017-12-12 ENCOUNTER — Other Ambulatory Visit: Payer: Self-pay

## 2017-12-12 ENCOUNTER — Observation Stay (HOSPITAL_BASED_OUTPATIENT_CLINIC_OR_DEPARTMENT_OTHER): Payer: BLUE CROSS/BLUE SHIELD

## 2017-12-12 DIAGNOSIS — E785 Hyperlipidemia, unspecified: Secondary | ICD-10-CM | POA: Diagnosis present

## 2017-12-12 DIAGNOSIS — G92 Toxic encephalopathy: Secondary | ICD-10-CM | POA: Diagnosis present

## 2017-12-12 DIAGNOSIS — N289 Disorder of kidney and ureter, unspecified: Secondary | ICD-10-CM

## 2017-12-12 DIAGNOSIS — I150 Renovascular hypertension: Secondary | ICD-10-CM | POA: Diagnosis not present

## 2017-12-12 DIAGNOSIS — I6523 Occlusion and stenosis of bilateral carotid arteries: Secondary | ICD-10-CM

## 2017-12-12 DIAGNOSIS — I1310 Hypertensive heart and chronic kidney disease without heart failure, with stage 1 through stage 4 chronic kidney disease, or unspecified chronic kidney disease: Secondary | ICD-10-CM | POA: Diagnosis present

## 2017-12-12 DIAGNOSIS — Z86718 Personal history of other venous thrombosis and embolism: Secondary | ICD-10-CM

## 2017-12-12 DIAGNOSIS — K219 Gastro-esophageal reflux disease without esophagitis: Secondary | ICD-10-CM | POA: Diagnosis present

## 2017-12-12 DIAGNOSIS — I6340 Cerebral infarction due to embolism of unspecified cerebral artery: Secondary | ICD-10-CM | POA: Diagnosis not present

## 2017-12-12 DIAGNOSIS — N1832 Chronic kidney disease, stage 3b: Secondary | ICD-10-CM | POA: Diagnosis present

## 2017-12-12 DIAGNOSIS — I10 Essential (primary) hypertension: Secondary | ICD-10-CM | POA: Diagnosis present

## 2017-12-12 DIAGNOSIS — N183 Chronic kidney disease, stage 3 unspecified: Secondary | ICD-10-CM | POA: Diagnosis present

## 2017-12-12 DIAGNOSIS — N179 Acute kidney failure, unspecified: Secondary | ICD-10-CM | POA: Diagnosis present

## 2017-12-12 DIAGNOSIS — I63512 Cerebral infarction due to unspecified occlusion or stenosis of left middle cerebral artery: Principal | ICD-10-CM | POA: Diagnosis present

## 2017-12-12 DIAGNOSIS — R29818 Other symptoms and signs involving the nervous system: Secondary | ICD-10-CM | POA: Diagnosis not present

## 2017-12-12 DIAGNOSIS — D72829 Elevated white blood cell count, unspecified: Secondary | ICD-10-CM | POA: Diagnosis present

## 2017-12-12 DIAGNOSIS — R29701 NIHSS score 1: Secondary | ICD-10-CM | POA: Diagnosis present

## 2017-12-12 DIAGNOSIS — R4782 Fluency disorder in conditions classified elsewhere: Secondary | ICD-10-CM | POA: Diagnosis present

## 2017-12-12 DIAGNOSIS — Z8673 Personal history of transient ischemic attack (TIA), and cerebral infarction without residual deficits: Secondary | ICD-10-CM

## 2017-12-12 DIAGNOSIS — G459 Transient cerebral ischemic attack, unspecified: Secondary | ICD-10-CM | POA: Diagnosis not present

## 2017-12-12 DIAGNOSIS — R531 Weakness: Secondary | ICD-10-CM | POA: Diagnosis not present

## 2017-12-12 DIAGNOSIS — I739 Peripheral vascular disease, unspecified: Secondary | ICD-10-CM | POA: Diagnosis present

## 2017-12-12 DIAGNOSIS — Z79899 Other long term (current) drug therapy: Secondary | ICD-10-CM

## 2017-12-12 LAB — LIPID PANEL
CHOLESTEROL: 204 mg/dL — AB (ref 0–200)
HDL: 51 mg/dL (ref >40)
LDL Cholesterol: 140 mg/dL — ABNORMAL HIGH (ref 0–99)
TRIGLYCERIDES: 65 mg/dL (ref <150)
Total CHOL/HDL Ratio: 4 RATIO
VLDL: 13 mg/dL (ref 0–40)

## 2017-12-12 LAB — DIFFERENTIAL
Abs Immature Granulocytes: 0 10*3/uL (ref 0.0–0.1)
BASOS ABS: 0.1 10*3/uL (ref 0.0–0.1)
BASOS PCT: 1 %
Eosinophils Absolute: 0.2 10*3/uL (ref 0.0–0.7)
Eosinophils Relative: 1 %
Immature Granulocytes: 0 %
Lymphocytes Relative: 12 %
Lymphs Abs: 1.5 10*3/uL (ref 0.7–4.0)
MONO ABS: 0.9 10*3/uL (ref 0.1–1.0)
Monocytes Relative: 7 %
NEUTROS PCT: 79 %
Neutro Abs: 9.6 10*3/uL — ABNORMAL HIGH (ref 1.7–7.7)

## 2017-12-12 LAB — COMPREHENSIVE METABOLIC PANEL
ALK PHOS: 39 U/L (ref 38–126)
ALT: 27 U/L (ref 0–44)
AST: 70 U/L — ABNORMAL HIGH (ref 15–41)
Albumin: 3.4 g/dL — ABNORMAL LOW (ref 3.5–5.0)
Anion gap: 10 (ref 5–15)
BUN: 35 mg/dL — ABNORMAL HIGH (ref 8–23)
CALCIUM: 9 mg/dL (ref 8.9–10.3)
CHLORIDE: 103 mmol/L (ref 98–111)
CO2: 23 mmol/L (ref 22–32)
Creatinine, Ser: 2.22 mg/dL — ABNORMAL HIGH (ref 0.61–1.24)
GFR calc non Af Amer: 30 mL/min — ABNORMAL LOW (ref >60)
GFR, EST AFRICAN AMERICAN: 34 mL/min — AB (ref >60)
Glucose, Bld: 109 mg/dL — ABNORMAL HIGH (ref 70–99)
Potassium: 4.9 mmol/L (ref 3.5–5.1)
SODIUM: 136 mmol/L (ref 135–145)
Total Bilirubin: 1 mg/dL (ref 0.3–1.2)
Total Protein: 5.9 g/dL — ABNORMAL LOW (ref 6.5–8.1)

## 2017-12-12 LAB — BASIC METABOLIC PANEL
Anion gap: 8 (ref 5–15)
BUN: 32 mg/dL — AB (ref 8–23)
CALCIUM: 8.9 mg/dL (ref 8.9–10.3)
CO2: 24 mmol/L (ref 22–32)
CREATININE: 2.04 mg/dL — AB (ref 0.61–1.24)
Chloride: 107 mmol/L (ref 98–111)
GFR calc Af Amer: 38 mL/min — ABNORMAL LOW (ref >60)
GFR, EST NON AFRICAN AMERICAN: 33 mL/min — AB (ref >60)
GLUCOSE: 115 mg/dL — AB (ref 70–99)
Potassium: 3.8 mmol/L (ref 3.5–5.1)
SODIUM: 139 mmol/L (ref 135–145)

## 2017-12-12 LAB — CBC
HCT: 42.9 % (ref 39.0–52.0)
Hemoglobin: 13.7 g/dL (ref 13.0–17.0)
MCH: 27.8 pg (ref 26.0–34.0)
MCHC: 31.9 g/dL (ref 30.0–36.0)
MCV: 87.2 fL (ref 78.0–100.0)
Platelets: 187 10*3/uL (ref 150–400)
RBC: 4.92 MIL/uL (ref 4.22–5.81)
RDW: 15.8 % — AB (ref 11.5–15.5)
WBC: 12.2 10*3/uL — ABNORMAL HIGH (ref 4.0–10.5)

## 2017-12-12 LAB — I-STAT CHEM 8, ED
BUN: 51 mg/dL — ABNORMAL HIGH (ref 8–23)
CREATININE: 2.4 mg/dL — AB (ref 0.61–1.24)
Calcium, Ion: 1.09 mmol/L — ABNORMAL LOW (ref 1.15–1.40)
Chloride: 104 mmol/L (ref 98–111)
GLUCOSE: 107 mg/dL — AB (ref 70–99)
HEMATOCRIT: 40 % (ref 39.0–52.0)
Hemoglobin: 13.6 g/dL (ref 13.0–17.0)
Potassium: 4.7 mmol/L (ref 3.5–5.1)
Sodium: 134 mmol/L — ABNORMAL LOW (ref 135–145)
TCO2: 29 mmol/L (ref 22–32)

## 2017-12-12 LAB — I-STAT TROPONIN, ED: TROPONIN I, POC: 0.05 ng/mL (ref 0.00–0.08)

## 2017-12-12 LAB — TSH: TSH: 1.198 u[IU]/mL (ref 0.350–4.500)

## 2017-12-12 LAB — HIV ANTIBODY (ROUTINE TESTING W REFLEX): HIV Screen 4th Generation wRfx: NONREACTIVE

## 2017-12-12 LAB — ECHOCARDIOGRAM COMPLETE
Height: 74 in
WEIGHTICAEL: 2720 [oz_av]

## 2017-12-12 LAB — APTT: APTT: 30 s (ref 24–36)

## 2017-12-12 LAB — HEMOGLOBIN A1C
Hgb A1c MFr Bld: 5.3 % (ref 4.8–5.6)
Mean Plasma Glucose: 105.41 mg/dL

## 2017-12-12 LAB — PROTIME-INR
INR: 0.99
Prothrombin Time: 13 seconds (ref 11.4–15.2)

## 2017-12-12 LAB — CBG MONITORING, ED: GLUCOSE-CAPILLARY: 110 mg/dL — AB (ref 70–99)

## 2017-12-12 MED ORDER — ASPIRIN 300 MG RE SUPP: 300.0000 mg | Freq: Every day | RECTAL | Status: DC

## 2017-12-12 MED ORDER — ASPIRIN 325 MG PO TABS
325.0000 mg | ORAL_TABLET | Freq: Every day | ORAL | Status: DC
  Administered 2017-12-12: 325 mg via ORAL
  Filled 2017-12-12: qty 1

## 2017-12-12 MED ORDER — ACETAMINOPHEN 160 MG/5ML PO SOLN: 650.0000 mg | ORAL | Status: DC | PRN

## 2017-12-12 MED ORDER — SENNOSIDES-DOCUSATE SODIUM 8.6-50 MG PO TABS: 1.0000 | ORAL_TABLET | Freq: Every evening | ORAL | Status: DC | PRN

## 2017-12-12 MED ORDER — ASPIRIN EC 81 MG PO TBEC
81.0000 mg | DELAYED_RELEASE_TABLET | Freq: Every day | ORAL | Status: DC
  Administered 2017-12-13 – 2017-12-14 (×2): 81 mg via ORAL
  Filled 2017-12-12 (×2): qty 1

## 2017-12-12 MED ORDER — ACETAMINOPHEN 325 MG PO TABS: 650.0000 mg | ORAL_TABLET | ORAL | Status: DC | PRN

## 2017-12-12 MED ORDER — LORAZEPAM 2 MG/ML IJ SOLN
1.0000 mg | Freq: Once | INTRAMUSCULAR | Status: AC
  Administered 2017-12-12: 1 mg via INTRAVENOUS
  Filled 2017-12-12: qty 1

## 2017-12-12 MED ORDER — CLOPIDOGREL BISULFATE 75 MG PO TABS
75.0000 mg | ORAL_TABLET | Freq: Every day | ORAL | Status: DC
  Administered 2017-12-13 – 2017-12-14 (×2): 75 mg via ORAL
  Filled 2017-12-12 (×2): qty 1

## 2017-12-12 MED ORDER — ATORVASTATIN CALCIUM 80 MG PO TABS
80.0000 mg | ORAL_TABLET | Freq: Every day | ORAL | Status: DC
  Administered 2017-12-12: 80 mg via ORAL
  Filled 2017-12-12: qty 1

## 2017-12-12 MED ORDER — SODIUM CHLORIDE 0.9 % IV SOLN
INTRAVENOUS | Status: AC
  Administered 2017-12-12: 07:00:00 via INTRAVENOUS

## 2017-12-12 MED ORDER — STROKE: EARLY STAGES OF RECOVERY BOOK
Freq: Once | Status: DC
  Filled 2017-12-12 (×2): qty 1

## 2017-12-12 MED ORDER — ACETAMINOPHEN 650 MG RE SUPP: 650.0000 mg | RECTAL | Status: DC | PRN

## 2017-12-12 NOTE — ED Notes (Signed)
Patient ambulated to bathroom with steady gait.

## 2017-12-12 NOTE — Evaluation (Signed)
Occupational Therapy Evaluation Patient Details Name: Billy [name] MRN: [id number] DOB: 1950/11/22 Today's Date: 12/12/2017    History of Present Illness 67 yo male s/p Punctate foci of acute/early subacute infarction within the left occipital lobe, left posterior frontal subcortical white matter left precentral gyrus, and left postcentral gyrus   Clinical Impression   Patient evaluated by Occupational Therapy with no further acute OT needs identified. All education has been completed and the patient has no further questions. See below for any follow-up Occupational Therapy or equipment needs. OT to sign off. Thank you for referral.      Follow Up Recommendations  No OT follow up    Equipment Recommendations  None recommended by OT    Recommendations for Other Services       Precautions / Restrictions Precautions Precautions: None Restrictions Weight Bearing Restrictions: No      Mobility Bed Mobility Overal bed mobility: Independent                Transfers Overall transfer level: Independent                    Balance                                           ADL either performed or assessed with clinical judgement   ADL Overall ADL's : Independent                                             Vision Baseline Vision/History: Wears glasses Wears Glasses: At all times Vision Assessment?: No apparent visual deficits     Perception     Praxis      Pertinent Vitals/Pain Pain Assessment: No/denies pain     Hand Dominance Right   Extremity/Trunk Assessment Upper Extremity Assessment Upper Extremity Assessment: Overall WFL for tasks assessed   Lower Extremity Assessment Lower Extremity Assessment: Defer to PT evaluation   Cervical / Trunk Assessment Cervical / Trunk Assessment: Normal   Communication Communication Communication: No difficulties(reports moments of stuttering)   Cognition  Arousal/Alertness: Awake/alert Behavior During Therapy: WFL for tasks assessed/performed Overall Cognitive Status: Within Functional Limits for tasks assessed                                     General Comments       Exercises     Shoulder Instructions      Home Living Family/patient expects to be discharged to:: Private residence Living Arrangements: Parent Available Help at Discharge: Family;Available PRN/intermittently Type of Home: House Home Access: Ramped entrance     Home Layout: One level     Bathroom Shower/Tub: Teacher, early years/pre: Standard     Home Equipment: None   Additional Comments: takes care of 43 yo mother but reports that stress is apart of her care      Prior Functioning/Environment Level of Independence: Independent                 OT Problem List:        OT Treatment/Interventions:      OT Goals(Current goals can be found in the care plan section) Acute Rehab OT  Goals Patient Stated Goal: to know why this happened  OT Frequency:     Barriers to D/C:            Co-evaluation              AM-PAC PT "6 Clicks" Daily Activity     Outcome Measure Help from another person eating meals?: None Help from another person taking care of personal grooming?: None Help from another person toileting, which includes using toliet, bedpan, or urinal?: None Help from another person bathing (including washing, rinsing, drying)?: None Help from another person to put on and taking off regular upper body clothing?: None Help from another person to put on and taking off regular lower body clothing?: None 6 Click Score: 24   End of Session Equipment Utilized During Treatment: Gait belt Nurse Communication: Mobility status;Precautions  Activity Tolerance: Patient tolerated treatment well Patient left: in chair;with call bell/phone within reach;with family/visitor present                   Time: 1000-1018 OT  Time Calculation (min): 18 min Charges:  OT General Charges $OT Visit: 1 Visit OT Evaluation $OT Eval Low Complexity: 1 Low   Jeri Modena   OTR/L Pager: [phone] Office: [phone] .   Parke Poisson B 12/12/2017, 10:19 AM

## 2017-12-12 NOTE — ED Triage Notes (Signed)
Patient BIB [facility] for stroke like symptoms like started 30 minutes ago. Per EMS patient called out of "stuttering" and altered mental status. Patient's mother was the only one who witnessed the "stuttering" which resolved once EMS arrived. Patient also c/o cramping in his RIGHT head. Denies weakness, headache, slurred speech, blurry vision, chest pain, or SOB.   BP 182/99 HR 70 CBG 111

## 2017-12-12 NOTE — Progress Notes (Signed)
  Echocardiogram 2D Echocardiogram has been performed.  Bobbye Charleston 12/12/2017, 12:10 PM

## 2017-12-12 NOTE — Progress Notes (Addendum)
STROKE TEAM PROGRESS NOTE   INTERVAL HISTORY His sister and ex-wife are at the bedside.  Pt has been under stress - currently living with and taking care of his 66 yo mother. Discussed stress-relieving activities as well as possible day-care like options for his mother.  This am, he still has decreased sensation in his L arm, improved since yesterday. He denies LOC or sz-like activity.  Vitals:   12/12/17 0124 12/12/17 0342 12/12/17 0518 12/12/17 0703  BP:  (!) 175/84 (!) 175/84   Pulse:  66 63   Resp:  20 16   Temp:  97.8 F (36.6 C) 97.7 F (36.5 C) 97.7 F (36.5 C)  TempSrc:  Oral Oral   SpO2:  98% 99%   Weight: 77.1 kg     Height: 6\' 2"  (1.88 m)       CBC:  Recent Labs  Lab 12/12/17 0125 12/12/17 0144  WBC 12.2*  --   NEUTROABS 9.6*  --   HGB 13.7 13.6  HCT 42.9 40.0  MCV 87.2  --   PLT 187  --     Basic Metabolic Panel:  Recent Labs  Lab 12/12/17 0125 12/12/17 0144 12/12/17 0600  NA 136 134* 139  K 4.9 4.7 3.8  CL 103 104 107  CO2 23  --  24  GLUCOSE 109* 107* 115*  BUN 35* 51* 32*  CREATININE 2.22* 2.40* 2.04*  CALCIUM 9.0  --  8.9   Lipid Panel:     Component Value Date/Time   CHOL 204 (H) 12/12/2017 0600   CHOL 198 12/03/2015 0918   TRIG 65 12/12/2017 0600   HDL 51 12/12/2017 0600   HDL 39 (L) 12/03/2015 0918   CHOLHDL 4.0 12/12/2017 0600   VLDL 13 12/12/2017 0600   LDLCALC 140 (H) 12/12/2017 0600   LDLCALC 140 (H) 12/03/2015 0918   HgbA1c:  Lab Results  Component Value Date   HGBA1C 5.3 12/12/2017   Urine Drug Screen: No results found for: LABOPIA, COCAINSCRNUR, LABBENZ, AMPHETMU, THCU, LABBARB  Alcohol Level No results found for: [facility]  IMAGING Mr Jodene Nam Head Wo Contrast  Result Date: 12/12/2017 CLINICAL DATA:  67 y/o M; episode of stuttering speech as well as numbness and weakness of the right hand. EXAM: MRI HEAD WITHOUT CONTRAST MRA HEAD WITHOUT CONTRAST TECHNIQUE: Multiplanar, multiecho pulse sequences of the brain and surrounding  structures were obtained without intravenous contrast. Angiographic images of the head were obtained using MRA technique without contrast. COMPARISON:  06/29/2017 CT head. FINDINGS: MRI HEAD FINDINGS Brain: Punctate foci of reduced diffusion are present within the left occipital lobe, left posterior frontal subcortical white matter, and the cortices of both the left pre and postcentral gyri. No associated hemorrhage or mass effect. Few nonspecific T2 FLAIR hyperintensities in subcortical and periventricular white matter are compatible with mild chronic microvascular ischemic changes for age. Mild volume loss of the brain. No extra-axial collection, hydrocephalus, or herniation. Vascular: As below. Skull and upper cervical spine: Normal marrow signal. Sinuses/Orbits: Negative. Other: Left intra-ocular lens replacement. MRA HEAD FINDINGS Internal carotid arteries:  Patent. Anterior cerebral arteries:  Patent. Middle cerebral arteries: Patent. Anterior communicating artery: Patent. Posterior communicating arteries:  Patent. Posterior cerebral arteries:  Patent. Basilar artery:  Patent. Vertebral arteries:  Patent. No evidence of high-grade stenosis, large vessel occlusion, or aneurysm unless noted above. IMPRESSION: 1. Punctate foci of acute/early subacute infarction within the left occipital lobe, left posterior frontal subcortical white matter, left precentral gyrus, and left postcentral gyrus. Multiple  vascular territories suggests embolic etiology. No associated hemorrhage or mass effect. 2. Mild chronic microvascular ischemic changes and volume loss of the brain. 3. Normal MRA of the head. These results were called by telephone at the time of interpretation on 12/12/2017 at 9:37 am to Dr. Roxanne Mins , who verbally acknowledged these results. Electronically Signed   By: Kristine Garbe M.D.   On: 12/12/2017 05:00   Mr Brain Wo Contrast  Result Date: 12/12/2017 CLINICAL DATA:  67 y/o M; episode of stuttering  speech as well as numbness and weakness of the right hand. EXAM: MRI HEAD WITHOUT CONTRAST MRA HEAD WITHOUT CONTRAST TECHNIQUE: Multiplanar, multiecho pulse sequences of the brain and surrounding structures were obtained without intravenous contrast. Angiographic images of the head were obtained using MRA technique without contrast. COMPARISON:  06/29/2017 CT head. FINDINGS: MRI HEAD FINDINGS Brain: Punctate foci of reduced diffusion are present within the left occipital lobe, left posterior frontal subcortical white matter, and the cortices of both the left pre and postcentral gyri. No associated hemorrhage or mass effect. Few nonspecific T2 FLAIR hyperintensities in subcortical and periventricular white matter are compatible with mild chronic microvascular ischemic changes for age. Mild volume loss of the brain. No extra-axial collection, hydrocephalus, or herniation. Vascular: As below. Skull and upper cervical spine: Normal marrow signal. Sinuses/Orbits: Negative. Other: Left intra-ocular lens replacement. MRA HEAD FINDINGS Internal carotid arteries:  Patent. Anterior cerebral arteries:  Patent. Middle cerebral arteries: Patent. Anterior communicating artery: Patent. Posterior communicating arteries:  Patent. Posterior cerebral arteries:  Patent. Basilar artery:  Patent. Vertebral arteries:  Patent. No evidence of high-grade stenosis, large vessel occlusion, or aneurysm unless noted above. IMPRESSION: 1. Punctate foci of acute/early subacute infarction within the left occipital lobe, left posterior frontal subcortical white matter, left precentral gyrus, and left postcentral gyrus. Multiple vascular territories suggests embolic etiology. No associated hemorrhage or mass effect. 2. Mild chronic microvascular ischemic changes and volume loss of the brain. 3. Normal MRA of the head. These results were called by telephone at the time of interpretation on 12/12/2017 at 9:02 am to Dr. Roxanne Mins , who verbally acknowledged  these results. Electronically Signed   By: Kristine Garbe M.D.   On: 12/12/2017 05:00   Carotid artery duplex  Bilateral internal carotid arteries are 40-59%. Vertebral arteries are patent with antegrade flow.   Bilateral lower extremity venous duplex  Bilateral lower extremities are negative for deep vein thrombosis. There is no evidence of Baker's cyst bilaterally.  2D Echocardiogram  pending   PHYSICAL EXAM GENERAL: Awake, alert in NAD.   HEENT: - Normocephalic and atraumatic LUNGS - Clear to auscultation bilaterally with no wheezes CV - S1S2 RRR, no m/r/g, equal pulses bilaterally. Ext: warm, well perfused, intact peripheral pulses, no frank edema in the extremities. R leg bigger than L leg (R leg with DVT 2017, bigger than L since that time)  NEURO:  Mental Status: AA&Ox3  Language: speech is clear, no stuttering noted.  Naming, repetition, fluency, and comprehension intact. Cranial Nerves: PERRL. EOMI, visual fields full, no facial asymmetry, facial sensation intact, hearing intact, tongue/uvula/soft palate midline, normal sternocleidomastoid and trapezius muscle strength. No evidence of tongue atrophy or fibrillations Motor: Right upper extremity, Right lower extremity, left upper extremity and left lower extremity are all 5/5 Tone: is normal and bulk is normal Sensation- intact bilaterraly face, arm, legs,  hand/fingers. Subjective abnormal sensation L arm only Coordination: FTN intact bilaterally, no ataxia in BLE. Gait-steady Bilateral downgoing toes.     ASSESSMENT/PLAN  Mr. Naethan Bracewell is a 67 y.o. male with history of DVT in 2017, HTN, CKD, BLE numbness presenting with R hand numbness and speech difficulties x 30 mins.   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 Small vessel disease. Atrophy.  MRA   negative   Carotid Doppler  B ICA 1-39% stenosis, VAs antegrade   2D Echo  pending   LE dopplers negative  TEE to look for embolic source. Arranged with [place] for Thursday (scheduled full tomorrow). I made NPO for Thurs.  If TEE negative, a [place] electrophysiologist will consult and consider placement of an implantable loop recorder to evaluate for atrial fibrillation as etiology of stroke. This has been explained to patient/family by and they are agreeable.   LDL 140  HgbA1c 5.3  TSH ok  SCDs for VTE prophylaxis  No antithrombotic prior to admission, now on aspirin 325 mg daily. Continue aspirin for now  Therapy recommendations:  No therapy needs  Disposition:  Plan return home  Hypertension  Elevated 170s but Stable . Permissive hypertension (OK if < 220/120) but gradually normalize in 5-7 days . Long-term BP goal normotensive  Hyperlipidemia  Home meds:  No statin  Now on lipitor 80  LDL 140, goal < 70  Continue statin at discharge  Other Stroke Risk Factors  Hx DVT 2017 tx w/ Eliquis x 6 mos  Other Active Problems  KCD stage III Cr 2.04  Hospital day # 0  Burnetta Sabin, MSN, APRN, ANVP-BC, AGPCNP-BC Advanced Practice Stroke Nurse [place] for Schedule & Pager information 12/12/2017 9:42 AM   ATTENDING NOTE: I reviewed above note and agree with the assessment and plan. I have made any additions or clarifications directly to the above note. Pt was seen and examined.   67 year old male with history of hypertension, CKD, right DVT in 2017 with 6 months of Eliquis treatment admitted for right hand numbness for 30 minutes with speech difficulty.  Now resolved.  MRI showed left MCA/PCA, MCA/ACA, and bilateral MCA punctate infarcts.  MRA negative.  Carotid Doppler unremarkable, 2D echo pending.  A1c 5.3, and LDL 140.  Creatinine 2.4.  UDS pending.  LE venous Doppler negative  for DVT this time.  Patient stroke concerning for cardio embolic pattern.  Patient denies any heart palpitation or racing heart.  Will recommend TEE/loop recorder for further cardioembolic work-up.  Continue DAPT for 3 weeks and then aspirin alone.  Continue statin for stroke prevention.  Rosalin Hawking, MD PhD Stroke Neurology 12/12/2017 5:00 PM     To contact Stroke Continuity provider, please refer to [email or web address] After hours, contact General Neurology

## 2017-12-12 NOTE — Progress Notes (Signed)
*  Preliminary Results* Carotid artery duplex has been completed. Bilateral internal carotid arteries are 40-59%. Vertebral arteries are patent with antegrade flow.   Bilateral lower extremity venous duplex completed. Bilateral lower extremities are negative for deep vein thrombosis. There is no evidence of Baker's cyst bilaterally.   12/12/2017 12:10 PM  Billy [name] [name]

## 2017-12-12 NOTE — Evaluation (Signed)
Physical Therapy Evaluation Patient Details Name: Billy [name] MRN: [id number] DOB: [birth year] Today's Date: 12/12/2017   History of Present Illness  67 yo male s/p Punctate foci of acute/early subacute infarction within the left occipital lobe, left posterior frontal subcortical white matter left precentral gyrus, and left postcentral gyrus  Clinical Impression  Pt is at or close to baseline functioning and should be safe at home . There are no further acute PT needs.  Will sign off at this time.     Follow Up Recommendations No PT follow up    Equipment Recommendations       Recommendations for Other Services       Precautions / Restrictions Precautions Precautions: None Restrictions Weight Bearing Restrictions: No      Mobility  Bed Mobility Overal bed mobility: Independent                Transfers Overall transfer level: Independent                  Ambulation/Gait Ambulation/Gait assistance: Independent Gait Distance (Feet): 600 Feet Assistive device: None Gait Pattern/deviations: Step-through pattern   Gait velocity interpretation: >4.37 ft/sec, indicative of normal walking speed General Gait Details: generally steady with a few minor deviations when balance challenged significantly  Stairs Stairs: Yes Stairs assistance: Independent Stair Management: No rails;One rail Right;Alternating pattern;Forwards Number of Stairs: 3 General stair comments: safe with/without rails  Wheelchair Mobility    Modified Rankin (Stroke Patients Only) Modified Rankin (Stroke Patients Only) Pre-Morbid Rankin Score: No symptoms Modified Rankin: Slight disability     Balance Overall balance assessment: Independent                               Standardized Balance Assessment Standardized Balance Assessment : Berg Balance Test;Dynamic Gait Index Berg Balance Test Sit to Stand: Able to stand without using hands and stabilize  independently Standing Unsupported: Able to stand safely 2 minutes Sitting with Back Unsupported but Feet Supported on Floor or Stool: Able to sit safely and securely 2 minutes Stand to Sit: Sits safely with minimal use of hands Transfers: Able to transfer safely, minor use of hands Standing Unsupported with Eyes Closed: Able to stand 10 seconds safely Standing Ubsupported with Feet Together: Able to place feet together independently and stand 1 minute safely From Standing, Reach Forward with Outstretched Arm: Can reach confidently >25 cm (10") From Standing Position, Pick up Object from Floor: Able to pick up shoe safely and easily From Standing Position, Turn to Look Behind Over each Shoulder: Looks behind one side only/other side shows less weight shift Turn 360 Degrees: Able to turn 360 degrees safely in 4 seconds or less Standing Unsupported, Alternately Place Feet on Step/Stool: Able to stand independently and complete 8 steps >20 seconds Standing Unsupported, One Foot in Front: Able to take small step independently and hold 30 seconds Dynamic Gait Index Level Surface: Normal Change in Gait Speed: Normal Gait with Horizontal Head Turns: Normal Gait with Vertical Head Turns: Normal Gait and Pivot Turn: Normal Step Over Obstacle: Normal Step Around Obstacles: Normal Steps: Normal Total Score: 24       Pertinent Vitals/Pain Pain Assessment: No/denies pain    Home Living Family/patient expects to be discharged to:: Private residence Living Arrangements: Parent Available Help at Discharge: Family;Available PRN/intermittently Type of Home: House Home Access: Ramped entrance     Home Layout: One level Home Equipment: None Additional Comments: 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 but reports that stress is apart of her care    Prior Function Level of Independence: Independent               Hand Dominance   Dominant Hand: Right    Extremity/Trunk Assessment   Upper  Extremity Assessment Upper Extremity Assessment: Overall WFL for tasks assessed    Lower Extremity Assessment Lower Extremity Assessment: Overall WFL for tasks assessed(R hip flexor weakness)    Cervical / Trunk Assessment Cervical / Trunk Assessment: Normal  Communication   Communication: No difficulties(reports moments of stuttering)  Cognition Arousal/Alertness: Awake/alert Behavior During Therapy: WFL for tasks assessed/performed Overall Cognitive Status: Within Functional Limits for tasks assessed                                        General Comments      Exercises     Assessment/Plan    PT Assessment Patent does not need any further PT services  PT Problem List         PT Treatment Interventions      PT Goals (Current goals can be found in the Care Plan section)  Acute Rehab PT Goals Patient Stated Goal: to know why this happened PT Goal Formulation: All assessment and education complete, DC therapy    Frequency     Barriers to discharge        Co-evaluation               AM-PAC PT "6 Clicks" Daily Activity  Outcome Measure Difficulty turning over in bed (including adjusting bedclothes, sheets and blankets)?: None Difficulty moving from lying on back to sitting on the side of the bed? : None Difficulty sitting down on and standing up from a chair with arms (e.g., wheelchair, bedside commode, etc,.)?: None Help needed moving to and from a bed to chair (including a wheelchair)?: None Help needed walking in hospital room?: None Help needed climbing 3-5 steps with a railing? : None 6 Click Score: 24    End of Session   Activity Tolerance: Patient tolerated treatment well Patient left: in bed Nurse Communication: Mobility status PT Visit Diagnosis: Other symptoms and signs involving the nervous system (R29.898);Unsteadiness on feet (R26.81)    Time: 5498-2641 PT Time Calculation (min) (ACUTE ONLY): 31 min   Charges:   PT  Evaluation $PT Eval Moderate Complexity: 1 Mod PT Treatments $Gait Training: 8-22 mins        12/12/2017  Donnella Sham, PT [phone]  (pager)  Tessie Fass Kodee Ravert 12/12/2017, 1:42 PM

## 2017-12-12 NOTE — ED Notes (Signed)
Patient ambulated to bathroom without assistance, steady gait noted.  

## 2017-12-12 NOTE — ED Notes (Signed)
Patient transported to MRI 

## 2017-12-12 NOTE — Progress Notes (Addendum)
[facility] Progress Note  Subjective: (NO CHARGE, after MN admission).  R arm is stronger, less numb, still not to normal.  No speech issues today  Vitals:   12/12/17 0342 12/12/17 0518 12/12/17 0703 12/12/17 0807  BP: (!) 175/84 (!) 175/84  (!) 154/78  Pulse: 66 63  64  Resp: 20 16  20   Temp: 97.8 F (36.6 C) 97.7 F (36.5 C) 97.7 F (36.5 C)   TempSrc: Oral Oral    SpO2: 98% 99%  97%  Weight:      Height:        Inpatient medications: .  stroke: mapping our early stages of recovery book   Does not apply Once  . aspirin  300 mg Rectal Daily   Or  . aspirin  325 mg Oral Daily  . atorvastatin  80 mg Oral q1800   . sodium chloride 90 mL/hr at 12/12/17 3267   acetaminophen **OR** acetaminophen (TYLENOL) oral liquid 160 mg/5 mL **OR** acetaminophen, senna-docusate  Exam: Constitutional: NAD, calm  Eyes: PERTLA, lids and conjunctivae normal ENMT: Mucous membranes are moist. Posterior pharynx clear of any exudate or lesions.   Neck: normal, supple, no masses, no thyromegaly Respiratory: clear to auscultation bilaterally, no wheezing, no crackles. Normal respiratory effort.    Cardiovascular: S1 & S2 heard, regular rate and rhythm. No extremity edema.   Abdomen: No distension, no tenderness, soft. Bowel sounds normal.  Musculoskeletal: no clubbing / cyanosis. No joint deformity upper and lower extremities. Normal muscle tone.  Skin: no significant rashes, lesions, ulcers. Warm, dry, well-perfused. Neurologic: CN 2-12 grossly intact. DTR normal. Strength 5/5 in all 4 limbs.  Psychiatric: Alert and oriented x 3. Calm, cooperative.    Presentation Summary: Billy [name] is a right-handed 66 y.o. male with medical history significant for history of DVT status post 6 months Eliquis, renal artery stenosis with chronic kidney disease stage III, and hypertension, now presenting to the emergency department after an episode of stuttering speech and numbness and weakness involving the  right hand. Patient reports that he was seated at home watching TV and in his usual state when he developed the acute onset of speech difficulty, described by his sister as stuttering, and noted that his right hand was "drawing up."  He also reported experiencing numbness and weakness in the right hand. No recent fall or trauma. Had recently been started on gabapentin for a neuropathy of uncertain etiology that is being evaluated by his outpatient neurologist. Symptoms almost completely resolved after approximately 30 minutes.  ED Course: Upon arrival to the ED, patient is found to be afebrile, saturating well on room air, slightly hypertensive, and vitals otherwise normal. EKG features a sinus rhythm with diffuse ST-T abnormalities that appears similar to prior. Chemistry panel is notable for a serum creatinine of 2.22, similar to priors. CBC features a leukocytosis to 12,200. INR and troponin both normal. Neurology was consulted by the ED physician and recommended a medical admission for further evaluation and management.       Impression/ Plan: Transient neurologic deficits  - Presents after an episode of speech disturbance and numbness/weakness involving the right hand that lasted ~30 minutes  - Neurology consulting and much appreciated - MRI of brain overnight showed numerous areas of restricted perfusion in multiple territories of the L cerebral hemisphere.  Per neuro likely embolic lesions. W/u in progress.  - MRA brain showed "no evidence of high-grade stenosis, large vessel occlusion, or aneurysm".  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04*  CALCIUM 9.0  --  8.9   Recent Labs  Lab 12/12/17 0125  AST 70*  ALT 27  ALKPHOS 39  BILITOT 1.0  PROT 5.9*  ALBUMIN 3.4*   Recent Labs  Lab 12/12/17 0125 12/12/17 0144  WBC 12.2*  --   NEUTROABS 9.6*  --   HGB 13.7 13.6  HCT 42.9 40.0  MCV 87.2  --   PLT 187  --    Iron/TIBC/Ferritin/ %Sat No results found for: IRON, TIBC, FERRITIN, IRONPCTSAT
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
## 2017-12-12 NOTE — ED Notes (Signed)
Attempted report, no answer on unit
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
## 2017-12-13 DIAGNOSIS — K219 Gastro-esophageal reflux disease without esophagitis: Secondary | ICD-10-CM | POA: Diagnosis present

## 2017-12-13 DIAGNOSIS — E785 Hyperlipidemia, unspecified: Secondary | ICD-10-CM | POA: Diagnosis present

## 2017-12-13 DIAGNOSIS — R29818 Other symptoms and signs involving the nervous system: Secondary | ICD-10-CM | POA: Diagnosis not present

## 2017-12-13 DIAGNOSIS — R531 Weakness: Secondary | ICD-10-CM | POA: Diagnosis present

## 2017-12-13 DIAGNOSIS — I6340 Cerebral infarction due to embolism of unspecified cerebral artery: Secondary | ICD-10-CM | POA: Diagnosis not present

## 2017-12-13 DIAGNOSIS — I1310 Hypertensive heart and chronic kidney disease without heart failure, with stage 1 through stage 4 chronic kidney disease, or unspecified chronic kidney disease: Secondary | ICD-10-CM | POA: Diagnosis present

## 2017-12-13 DIAGNOSIS — D72829 Elevated white blood cell count, unspecified: Secondary | ICD-10-CM | POA: Diagnosis present

## 2017-12-13 DIAGNOSIS — G92 Toxic encephalopathy: Secondary | ICD-10-CM | POA: Diagnosis present

## 2017-12-13 DIAGNOSIS — Z79899 Other long term (current) drug therapy: Secondary | ICD-10-CM | POA: Diagnosis not present

## 2017-12-13 DIAGNOSIS — R29701 NIHSS score 1: Secondary | ICD-10-CM | POA: Diagnosis present

## 2017-12-13 DIAGNOSIS — I739 Peripheral vascular disease, unspecified: Secondary | ICD-10-CM | POA: Diagnosis present

## 2017-12-13 DIAGNOSIS — N183 Chronic kidney disease, stage 3 (moderate): Secondary | ICD-10-CM | POA: Diagnosis present

## 2017-12-13 DIAGNOSIS — I6389 Other cerebral infarction: Secondary | ICD-10-CM | POA: Diagnosis not present

## 2017-12-13 DIAGNOSIS — R4782 Fluency disorder in conditions classified elsewhere: Secondary | ICD-10-CM | POA: Diagnosis present

## 2017-12-13 DIAGNOSIS — N179 Acute kidney failure, unspecified: Secondary | ICD-10-CM | POA: Diagnosis present

## 2017-12-13 DIAGNOSIS — Z86718 Personal history of other venous thrombosis and embolism: Secondary | ICD-10-CM | POA: Diagnosis not present

## 2017-12-13 DIAGNOSIS — I63512 Cerebral infarction due to unspecified occlusion or stenosis of left middle cerebral artery: Secondary | ICD-10-CM | POA: Diagnosis present

## 2017-12-13 LAB — BASIC METABOLIC PANEL
ANION GAP: 6 (ref 5–15)
BUN: 28 mg/dL — AB (ref 8–23)
CO2: 27 mmol/L (ref 22–32)
Calcium: 8.9 mg/dL (ref 8.9–10.3)
Chloride: 109 mmol/L (ref 98–111)
Creatinine, Ser: 1.88 mg/dL — ABNORMAL HIGH (ref 0.61–1.24)
GFR calc Af Amer: 42 mL/min — ABNORMAL LOW (ref >60)
GFR, EST NON AFRICAN AMERICAN: 36 mL/min — AB (ref >60)
Glucose, Bld: 111 mg/dL — ABNORMAL HIGH (ref 70–99)
POTASSIUM: 3.8 mmol/L (ref 3.5–5.1)
SODIUM: 142 mmol/L (ref 135–145)

## 2017-12-13 LAB — CBC
HCT: 43.5 % (ref 39.0–52.0)
HEMOGLOBIN: 14.1 g/dL (ref 13.0–17.0)
MCH: 27.7 pg (ref 26.0–34.0)
MCHC: 32.4 g/dL (ref 30.0–36.0)
MCV: 85.5 fL (ref 78.0–100.0)
Platelets: 167 10*3/uL (ref 150–400)
RBC: 5.09 MIL/uL (ref 4.22–5.81)
RDW: 15.1 % (ref 11.5–15.5)
WBC: 8.8 10*3/uL (ref 4.0–10.5)

## 2017-12-13 MED ORDER — GI COCKTAIL ~~LOC~~
30.0000 mL | Freq: Once | ORAL | Status: AC
  Administered 2017-12-13: 30 mL via ORAL
  Filled 2017-12-13: qty 30

## 2017-12-13 MED ORDER — LORAZEPAM 1 MG PO TABS
1.0000 mg | ORAL_TABLET | Freq: Once | ORAL | Status: AC
  Administered 2017-12-13: 1 mg via ORAL
  Filled 2017-12-13: qty 1

## 2017-12-13 MED ORDER — BISMUTH SUBSALICYLATE 262 MG/15ML PO SUSP
30.0000 mL | ORAL | Status: DC | PRN
  Administered 2017-12-13: 30 mL via ORAL
  Filled 2017-12-13: qty 236

## 2017-12-13 NOTE — Progress Notes (Signed)
Pt complaining of an upset stomach, pepto bismol ordered.

## 2017-12-13 NOTE — Progress Notes (Signed)
On assessment pt bp is elevated, lying in bed, denies pain, states he takes hydralazine tid 100 mg po and others at night but cannot recall what they are. (poor historian) son is at bedside and states that "I have to make him do better".  Dr. Florene Glen was paged to at least add a prn medication to [facility] for bp

## 2017-12-13 NOTE — Progress Notes (Addendum)
STROKE TEAM PROGRESS NOTE   INTERVAL HISTORY His son is at the bedside. He continues to feel better. TEE scheduled for tomorrow with planned loop recorder insertion. Anticipate d/c following.  Reviewed tx plan and ongoing care with pt and son. 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   Vitals:   12/12/17 2300 12/13/17 0338 12/13/17 0844 12/13/17 0845  BP: (!) 171/87 (!) 154/88 (!) 188/108 (!) 193/102  Pulse: 60 60    Resp: 19 18    Temp:  97.8 F (36.6 C)  97.6 F (36.4 C)  TempSrc:  Oral  Oral  SpO2: 99% 98%    Weight:      Height:        CBC:  Recent Labs  Lab 12/12/17 0125 12/12/17 0144  WBC 12.2*  --   NEUTROABS 9.6*  --   HGB 13.7 13.6  HCT 42.9 40.0  MCV 87.2  --   PLT 187  --     Basic Metabolic Panel:  Recent Labs  Lab 12/12/17 0125 12/12/17 0144 12/12/17 0600  NA 136 134* 139  K 4.9 4.7 3.8  CL 103 104 107  CO2 23  --  24  GLUCOSE 109* 107* 115*  BUN 35* 51* 32*  CREATININE 2.22* 2.40* 2.04*  CALCIUM 9.0  --  8.9   Lipid Panel:     Component Value Date/Time   CHOL 204 (H) 12/12/2017 0600   CHOL 198 12/03/2015 0918   TRIG 65 12/12/2017 0600   HDL 51 12/12/2017 0600   HDL 39 (L) 12/03/2015 0918   CHOLHDL 4.0 12/12/2017 0600   VLDL 13 12/12/2017 0600   LDLCALC 140 (H) 12/12/2017 0600   LDLCALC 140 (H) 12/03/2015 0918   HgbA1c:  Lab Results  Component Value Date   HGBA1C 5.3 12/12/2017   Urine Drug Screen: No results found for: LABOPIA, COCAINSCRNUR, LABBENZ, AMPHETMU, THCU, LABBARB  Alcohol Level No results found for: [facility]  IMAGING Mr Jodene Nam Head Wo Contrast  Result Date: 12/12/2017 CLINICAL DATA:  67 y/o M; episode of stuttering speech as well as numbness and weakness of the right hand. EXAM: MRI HEAD WITHOUT CONTRAST MRA HEAD WITHOUT CONTRAST TECHNIQUE: Multiplanar, multiecho pulse sequences of the brain and surrounding structures were obtained without intravenous contrast. Angiographic images of the head  were obtained using MRA technique without contrast. COMPARISON:  06/29/2017 CT head. FINDINGS: MRI HEAD FINDINGS Brain: Punctate foci of reduced diffusion are present within the left occipital lobe, left posterior frontal subcortical white matter, and the cortices of both the left pre and postcentral gyri. No associated hemorrhage or mass effect. Few nonspecific T2 FLAIR hyperintensities in subcortical and periventricular white matter are compatible with mild chronic microvascular ischemic changes for age. Mild volume loss of the brain. No extra-axial collection, hydrocephalus, or herniation. Vascular: As below. Skull and upper cervical spine: Normal marrow signal. Sinuses/Orbits: Negative. Other: Left intra-ocular lens replacement. MRA HEAD FINDINGS Internal carotid arteries:  Patent. Anterior cerebral arteries:  Patent. Middle cerebral arteries: Patent. Anterior communicating artery: Patent. Posterior communicating arteries:  Patent. Posterior cerebral arteries:  Patent. Basilar artery:  Patent. Vertebral arteries:  Patent. No evidence of high-grade stenosis, large vessel occlusion, or aneurysm unless noted above. IMPRESSION: 1. Punctate foci of acute/early subacute infarction within the left occipital lobe, left posterior frontal subcortical white matter, left precentral gyrus, and left postcentral gyrus. Multiple vascular territories suggests embolic etiology. No associated hemorrhage or mass effect. 2. Mild chronic microvascular ischemic  changes and volume loss of the brain. 3. Normal MRA of the head. These results were called by telephone at the time of interpretation on 12/12/2017 at 4:09 am to Dr. Roxanne Mins , who verbally acknowledged these results. Electronically Signed   By: Kristine Garbe M.D.   On: 12/12/2017 05:00   Mr Brain Wo Contrast  Result Date: 12/12/2017 CLINICAL DATA:  67 y/o M; episode of stuttering speech as well as numbness and weakness of the right hand. EXAM: MRI HEAD WITHOUT  CONTRAST MRA HEAD WITHOUT CONTRAST TECHNIQUE: Multiplanar, multiecho pulse sequences of the brain and surrounding structures were obtained without intravenous contrast. Angiographic images of the head were obtained using MRA technique without contrast. COMPARISON:  06/29/2017 CT head. FINDINGS: MRI HEAD FINDINGS Brain: Punctate foci of reduced diffusion are present within the left occipital lobe, left posterior frontal subcortical white matter, and the cortices of both the left pre and postcentral gyri. No associated hemorrhage or mass effect. Few nonspecific T2 FLAIR hyperintensities in subcortical and periventricular white matter are compatible with mild chronic microvascular ischemic changes for age. Mild volume loss of the brain. No extra-axial collection, hydrocephalus, or herniation. Vascular: As below. Skull and upper cervical spine: Normal marrow signal. Sinuses/Orbits: Negative. Other: Left intra-ocular lens replacement. MRA HEAD FINDINGS Internal carotid arteries:  Patent. Anterior cerebral arteries:  Patent. Middle cerebral arteries: Patent. Anterior communicating artery: Patent. Posterior communicating arteries:  Patent. Posterior cerebral arteries:  Patent. Basilar artery:  Patent. Vertebral arteries:  Patent. No evidence of high-grade stenosis, large vessel occlusion, or aneurysm unless noted above. IMPRESSION: 1. Punctate foci of acute/early subacute infarction within the left occipital lobe, left posterior frontal subcortical white matter, left precentral gyrus, and left postcentral gyrus. Multiple vascular territories suggests embolic etiology. No associated hemorrhage or mass effect. 2. Mild chronic microvascular ischemic changes and volume loss of the brain. 3. Normal MRA of the head. These results were called by telephone at the time of interpretation on 12/12/2017 at 8:11 am to Dr. Roxanne Mins , who verbally acknowledged these results. Electronically Signed   By: Kristine Garbe M.D.   On:  12/12/2017 05:00   Carotid artery duplex  Bilateral internal carotid arteries are 40-59%. Vertebral arteries are patent with antegrade flow.  Bilateral lower extremity venous duplex  Bilateral lower extremities are negative for deep vein thrombosis. There is no evidence of Baker's cyst bilaterally.  2D Echocardiogram  - Left ventricle: The cavity size was normal. Wall thickness was increased in a pattern of mild LVH. Basal to mid inferolateral hypokinesis. Basal inferior akinesis. Systolic function was normal. The estimated ejection fraction was 60%. Features are consistent with a pseudonormal left ventricular filling pattern, with concomitant abnormal relaxation and increased filling pressure (grade 2 diastolic dysfunction). - Aortic valve: There was no stenosis. - Mitral valve: There was trivial regurgitation. - Left atrium: The atrium was mildly dilated. - Right ventricle: The cavity size was normal. Systolic function was normal. - Right atrium: The atrium was mildly dilated. - Pulmonary arteries: No complete TR doppler jet so unable to estimate PA systolic pressure. - Inferior vena cava: The vessel was normal in size. The respirophasic diameter changes were in the normal range (>= 50%), consistent with normal central venous pressure. Impressions:  Normal LV size with mild LV hypertrophy. EF 60%. Basal to mid inferolateral hypokinesis, basal infererior akinesis. Moderate diastolic dysfunction. Normal RV size and systolic function. No significant valvular abnormalities.  TEE pending   PHYSICAL EXAM GENERAL: Awake, alert in NAD.   HEENT: -  Normocephalic and atraumatic Ext: warm, well perfused, intact peripheral pulses, no frank edema in the extremities. R leg bigger than L leg (R leg with DVT 2017, bigger than L since that time)  NEURO:  Mental Status: AA&Ox3  Language: speech is clear, no stuttering noted.  Naming, repetition, fluency, and comprehension intact. Cranial Nerves:  PERRL. EOMI, visual fields full, no facial asymmetry, facial sensation intact, hearing intact, tongue/uvula/soft palate midline, normal sternocleidomastoid and trapezius muscle strength. No evidence of tongue atrophy or fibrillations Motor: Right upper extremity, Right lower extremity, left upper extremity and left lower extremity are all 5/5 Tone: is normal and bulk is normal Sensation- intact bilaterraly face, arm, legs,  hand/fingers. Subjective abnormal sensation L arm only Coordination: FTN intact bilaterally, no ataxia in BLE. Gait-steady Bilateral downgoing toes.   No change in exam from yesterday   ASSESSMENT/PLAN [name] is a 67 y.o. male with history of DVT in 2017, HTN, CKD, BLE numbness presenting with R hand numbness and speech difficulties x 30 mins.   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 Small vessel disease. Atrophy.  MRA  negative   Carotid Doppler  B ICA 1-39% stenosis, VAs antegrade   2D Echo  EF 60%. No source of embolus   LE dopplers negative  TEE to look for embolic source. Arranged with [place] for Thursday. NPO after MN Thurs.  If TEE negative, a [place] electrophysiologist will consult and consider placement of an implantable loop recorder to evaluate for atrial fibrillation as etiology of stroke. This has been explained again with son and pt and they are agreeable.   LDL 140  HgbA1c 5.3  TSH ok  Ambulatory, SCDs not on - for VTE prophylaxis  No antithrombotic prior to admission, now on aspirin 81 mg daily and clopidogrel 75 mg daily. Continue DAPT x 3 weeks at discharge then ASPIRIN alone.  Therapy recommendations:  No therapy needs  Disposition:  Plan return home  [facility] for transfer to floor from stroke  standpoint  Hypertension  Elevated 150-190s . Permissive hypertension (OK if < 220/120) but gradually normalize in 5-7 days . Long-term BP goal normotensive  Hyperlipidemia  Home meds:  No statin  Now on lipitor 80  LDL 140, goal < 70  Continue statin at discharge  Other Stroke Risk Factors  Hx DVT 2017 tx w/ Eliquis x 6 mos  Other Active Problems  KCD stage III Cr 2.04  Hospital day # 0  Burnetta Sabin, MSN, APRN, ANVP-BC, AGPCNP-BC Advanced Practice Stroke Nurse [place] for Schedule & Pager information 12/13/2017 9:03 AM   ATTENDING NOTE: I reviewed above note and agree with the assessment and plan. I have made any additions or clarifications directly to the above note. Pt was seen and examined.   67 year old male with history of hypertension, CKD, right DVT in 2017 with 6 months of Eliquis treatment admitted for right hand numbness for 30 minutes with speech difficulty.  Now resolved.  MRI showed left MCA/PCA, MCA/ACA, and bilateral MCA punctate infarcts.  MRA negative.  Carotid Doppler unremarkable, EF 60%.  A1c 5.3, and LDL 140.  Creatinine 2.4.  UDS pending.  LE venous Doppler negative for DVT this time.  Patient stroke concerning for cardio embolic pattern.  Patient denies any  heart palpitation or racing heart.  pending TEE/loop recorder in am for further cardioembolic work-up.  Continue DAPT for 3 weeks and then aspirin alone.  Continue statin for stroke prevention. PT/OT no follow up needed.   Rosalin Hawking, MD PhD Stroke Neurology 12/13/2017 4:10 PM   To contact Stroke Continuity provider, please refer to [email or web address] After hours, contact General Neurology

## 2017-12-13 NOTE — Progress Notes (Signed)
PROGRESS NOTE    Billy [name]  NWG:[id number] DOB: 01-10-1951 DOA: 12/12/2017 PCP: Damaris Hippo, MD (Inactive)    Brief Narrative: Billy [name] is Billy [name] right-handed 66 y.o. male with medical history significant for history of DVT status post 6 months Eliquis, renal artery stenosis with chronic kidney disease stage III, and hypertension, now presenting to the emergency department after an episode of stuttering speech and numbness and weakness involving the right hand. Patient reports that he was seated at home watching TV and in his usual state when he developed the acute onset of speech difficulty, described by his sister as stuttering, and noted that his right hand was "drawing up."  He also reported experiencing numbness and weakness in the right hand. He denies any chest pain, palpitations, recent fevers or chills, nausea, vomiting, shortness of breath. No recent fall or trauma. Had recently been started on gabapentin for Billy [name] neuropathy of uncertain etiology that is being evaluated by his outpatient neurologist. Symptoms have almost completely resolved after approximately 30 minutes.  Assessment & Plan:   Principal Problem:   Transient neurologic deficit Active Problems:   Kidney disease, chronic, stage III (GFR 30-59 ml/min) (HCC)   Hypertension   Cerebral embolism with cerebral infarction   Transient neurologic deficits  L MCA PCA ACA watershed infarcts thought to be embolic 2/2 unknown source -Presents after an episode of speech disturbance and numbness/weakness involving the right hand that lasted ~30 minutes -Neurology consulting and much appreciated - MRI of brain overnight showed punctate foci of acute/early subacute infaction within L occipital lobe, L posterior frontal subcortical white matter, L precentral gyrus, and L postcentral gyrus (see report)   - MRA brain showed "no evidence of high-grade stenosis, large vessel occlusion, or aneurysm".   - Carotid dopplers with 40-59%  stenosis in bilateral ICA, bilateral antegrade flow within vertebral arteries - Negative LE dopplers - echo with normal EF, basal to mid inferolateral hypokinesis, basal inferior akinesis.  Diastolic dysfunction (see report) - A1c 5.3, LDL 140  - Lipitor 80 mg daily.  DAPT x 3 weeks then aspirin alone.  - At this time, planning for TEE/loop recorder for further cardioembolic work up.  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 Wall thickness was   increased in Billy [name] pattern of mild LVH. Basal to mid inferolateral   hypokinesis. Basal inferior akinesis. Systolic function was   normal. The estimated ejection fraction was 60%. Features are   consistent with Billy [name] pseudonormal left ventricular filling pattern,   with concomitant abnormal relaxation and increased filling   pressure (grade 2 diastolic dysfunction). - Aortic valve: There was no stenosis. - Mitral valve: There was trivial regurgitation. - Left atrium: The atrium was mildly dilated. - Right ventricle: The cavity size was normal. Systolic function   was normal. - Right atrium: The atrium  was mildly dilated. - Pulmonary arteries: No complete TR doppler jet so unable to    estimate PA systolic pressure. - Inferior vena cava: The vessel was normal in size. The   respirophasic diameter changes were in the normal range (>= 50%),   consistent with normal central venous pressure.  Impressions:  - Normal LV size with mild LV hypertrophy. EF 60%. Basal to mid   inferolateral hypokinesis, basal infererior akinesis. Moderate   diastolic dysfunction. Normal RV size and systolic function. No   significant valvular abnormalities.  Final Interpretation: Right Carotid: Velocities in the right ICA are consistent with Billy [name] 40-59%        stenosis.  Left Carotid: Velocities in the left ICA are consistent with Billy [name] 40-59% stenosis.  Vertebrals: Bilateral vertebral arteries demonstrate antegrade flow.  Final Interpretation: Right: There is no evidence of deep vein thrombosis in the lower extremity. No cystic structure found in the popliteal fossa. Left: There is no evidence of deep vein thrombosis in the lower extremity. No cystic structure found in the popliteal fossa.  Antimicrobials:  Anti-infectives (From admission, onward)   None     Subjective: Symptoms have resolved.  Had Billy [name] few hours of R arm weakness, numbness.   Objective: Vitals:   12/13/17 0338 12/13/17 0844 12/13/17 0845 12/13/17 0915  BP: (!) 154/88 (!) 188/108 (!) 193/102 (!) 181/96  Pulse: 60  63 62  Resp: 18  (!) 9 20  Temp: 97.8 F (36.6 C)  97.6 F (36.4 C)   TempSrc: Oral  Oral   SpO2: 98%  97% 99%  Weight:      Height:        Intake/Output Summary (Last 24 hours) at 12/13/2017 0943 Last data filed at 12/12/2017 1300 Gross per 24 hour  Intake 750 ml  Output -  Net 750 ml   Filed Weights   12/12/17 0124  Weight: 77.1 kg    Examination:  General exam: Appears calm and comfortable  Respiratory system: Clear to auscultation. Respiratory effort normal. Cardiovascular system: S1 & S2 heard, RRR.  Gastrointestinal system: Abdomen is nondistended, soft and nontender.  Central  nervous system: Alert and oriented x3.  CN 2-12 intact. 5/5 strength throughout.  No focal neurological deficits. Extremities: no lee Skin: No rashes, lesions or ulcers Psychiatry: Judgement and insight appear normal. Mood & affect appropriate.     Data Reviewed: I have personally reviewed following labs and imaging studies  CBC: Recent Labs  Lab 12/12/17 0125 12/12/17 0144  WBC 12.2*  --   NEUTROABS 9.6*  --   HGB 13.7 13.6  HCT 42.9 40.0  MCV 87.2  --   PLT 187  --    Basic Metabolic Panel: Recent Labs  Lab 12/12/17 0125 12/12/17 0144 12/12/17 0600  NA 136 134* 139  K 4.9 4.7 3.8  CL 103 104 107  CO2 23  --  24  GLUCOSE 109* 107* 115*  BUN 35* 51* 32*  CREATININE 2.22* 2.40* 2.04*  CALCIUM 9.0  --  8.9   GFR: Estimated Creatinine Clearance: 39.9 mL/min (Billy [name]) (by C-G formula based on SCr of 2.04 mg/dL (H)). Liver Function Tests: Recent Labs  Lab 12/12/17 0125  AST 70*  ALT 27  ALKPHOS 39  BILITOT 1.0  PROT 5.9*  ALBUMIN 3.4*   No results for input(s): LIPASE, AMYLASE in the last 168 hours. No results for input(s): AMMONIA in the last 168 hours. Coagulation Profile: Recent Labs  Lab 12/12/17 0125  INR 0.99  Cardiac Enzymes: No results for input(s): CKTOTAL, CKMB, CKMBINDEX, TROPONINI in the last 168 hours. BNP (last 3 results) No results for input(s): PROBNP in the last 8760 hours. HbA1C: Recent Labs    12/12/17 0600  HGBA1C 5.3   CBG: Recent Labs  Lab 12/12/17 0137  GLUCAP 110*   Lipid Profile: Recent Labs    12/12/17 0600  CHOL 204*  HDL 51  LDLCALC 140*  TRIG 65  CHOLHDL 4.0   Thyroid Function Tests: Recent Labs    12/12/17 0600  TSH 1.198   Anemia Panel: No results for input(s): VITAMINB12, FOLATE, FERRITIN, TIBC, IRON, RETICCTPCT in the last 72 hours. Sepsis Labs: No results for input(s): PROCALCITON, LATICACIDVEN in the last 168 hours.  No results found for this or any previous visit (from the past 240 hour(s)).        Radiology Studies: Mr Billy [name] ZO Contrast  Result Date: 12/12/2017 CLINICAL DATA:  67 y/o M; episode of stuttering speech as well as numbness and weakness of the right hand. EXAM: MRI HEAD WITHOUT CONTRAST MRA HEAD WITHOUT CONTRAST TECHNIQUE: Multiplanar, multiecho pulse sequences of the brain and surrounding structures were obtained without intravenous contrast. Angiographic images of the head were obtained using MRA technique without contrast. COMPARISON:  06/29/2017 CT head. FINDINGS: MRI HEAD FINDINGS Brain: Punctate foci of reduced diffusion are present within the left occipital lobe, left posterior frontal subcortical white matter, and the cortices of both the left pre and postcentral gyri. No associated hemorrhage or mass effect. Few nonspecific T2 FLAIR hyperintensities in subcortical and periventricular white matter are compatible with mild chronic microvascular ischemic changes for age. Mild volume loss of the brain. No extra-axial collection, hydrocephalus, or herniation. Vascular: As below. Skull and upper cervical spine: Normal marrow signal. Sinuses/Orbits: Negative. Other: Left intra-ocular lens replacement. MRA HEAD FINDINGS Internal carotid arteries:  Patent. Anterior cerebral arteries:  Patent. Middle cerebral arteries: Patent. Anterior communicating artery: Patent. Posterior communicating arteries:  Patent. Posterior cerebral arteries:  Patent. Basilar artery:  Patent. Vertebral arteries:  Patent. No evidence of high-grade stenosis, large vessel occlusion, or aneurysm unless noted above. IMPRESSION: 1. Punctate foci of acute/early subacute infarction within the left occipital lobe, left posterior frontal subcortical white matter, left precentral gyrus, and left postcentral gyrus. Multiple vascular territories suggests embolic etiology. No associated hemorrhage or mass effect. 2. Mild chronic microvascular ischemic changes and volume loss of the brain. 3. Normal MRA of the head.  These results were called by telephone at the time of interpretation on 12/12/2017 at 1:09 am to Dr. Roxanne [name] , who verbally acknowledged these results. Electronically Signed   By: Billy [name] M.D.   On: 12/12/2017 05:00   Mr Brain Wo Contrast  Result Date: 12/12/2017 CLINICAL DATA:  67 y/o M; episode of stuttering speech as well as numbness and weakness of the right hand. EXAM: MRI HEAD WITHOUT CONTRAST MRA HEAD WITHOUT CONTRAST TECHNIQUE: Multiplanar, multiecho pulse sequences of the brain and surrounding structures were obtained without intravenous contrast. Angiographic images of the head were obtained using MRA technique without contrast. COMPARISON:  06/29/2017 CT head. FINDINGS: MRI HEAD FINDINGS Brain: Punctate foci of reduced diffusion are present within the left occipital lobe, left posterior frontal subcortical white matter, and the cortices of both the left pre and postcentral gyri. No associated hemorrhage or mass effect. Few nonspecific T2 FLAIR hyperintensities in subcortical and periventricular white matter are compatible with mild chronic microvascular ischemic changes for age. Mild volume loss of the brain. No extra-axial  collection, hydrocephalus, or herniation. Vascular: As below. Skull and upper cervical spine: Normal marrow signal. Sinuses/Orbits: Negative. Other: Left intra-ocular lens replacement. MRA HEAD FINDINGS Internal carotid arteries:  Patent. Anterior cerebral arteries:  Patent. Middle cerebral arteries: Patent. Anterior communicating artery: Patent. Posterior communicating arteries:  Patent. Posterior cerebral arteries:  Patent. Basilar artery:  Patent. Vertebral arteries:  Patent. No evidence of high-grade stenosis, large vessel occlusion, or aneurysm unless noted above. IMPRESSION: 1. Punctate foci of acute/early subacute infarction within the left occipital lobe, left posterior frontal subcortical white matter, left precentral gyrus, and left postcentral gyrus.  Multiple vascular territories suggests embolic etiology. No associated hemorrhage or mass effect. 2. Mild chronic microvascular ischemic changes and volume loss of the brain. 3. Normal MRA of the head. These results were called by telephone at the time of interpretation on 12/12/2017 at 2:50 am to Dr. Roxanne [name] , who verbally acknowledged these results. Electronically Signed   By: Billy [name] M.D.   On: 12/12/2017 05:00        Scheduled Meds: .  stroke: mapping our early stages of recovery book   Does not apply Once  . aspirin EC  81 mg Oral Daily  . atorvastatin  80 mg Oral q1800  . clopidogrel  75 mg Oral Daily   Continuous Infusions:   LOS: 0 days    Time spent: over 30 min Mod MDM with multiple medical issues    Billy Helper, MD [facility] Pager [phone]  If 7PM-7AM, please contact night-coverage [email or web address] Password [facility] 12/13/2017, 9:43 AM

## 2017-12-13 NOTE — Progress Notes (Signed)
    [facility] has been requested to perform a transesophageal echocardiogram with possible loop recorder on Deckard Stuber for stroke.  After careful review of history and examination, the risks and benefits of transesophageal echocardiogram have been explained including risks of esophageal damage, perforation (1:10,000 risk), bleeding, pharyngeal hematoma as well as other potential complications associated with conscious sedation including aspiration, arrhythmia, respiratory failure and death. Alternatives to treatment were discussed, questions were answered. Patient is willing to proceed.   Kathyrn Drown, NP  12/13/2017 6:34 PM

## 2017-12-13 NOTE — Social Work (Signed)
Pt not recommended for any additional follow up for therapies. 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   CSW signing off. Please consult if any additional needs arise.  Alexander Mt, [place] Work [phone]

## 2017-12-13 NOTE — Progress Notes (Signed)
Dr. Florene Glen returned Nurses paged and stated that intentional elevated bp is fine since he came with symptoms related to a stroke.  Dr. Florene Glen stated the he will come on the unit and speak with patient and son (who is at bedside) to educate them on plan of care.   Dr. Florene Glen instructed Nurse to reassess blood pressure in 15 minutes.   Blood pressure was taken again in 15 minutes and doctor was notified (presently on the unit).   Blood pressure is documented in chart.

## 2017-12-14 ENCOUNTER — Inpatient Hospital Stay (HOSPITAL_COMMUNITY): Payer: BLUE CROSS/BLUE SHIELD

## 2017-12-14 ENCOUNTER — Encounter (HOSPITAL_COMMUNITY): Payer: Self-pay | Admitting: *Deleted

## 2017-12-14 ENCOUNTER — Encounter (HOSPITAL_COMMUNITY)
Admission: EM | Disposition: A | Discharge status: Home / Self Care | Payer: Self-pay | Source: Home / Self Care | Attending: Family Medicine

## 2017-12-14 DIAGNOSIS — I10 Essential (primary) hypertension: Secondary | ICD-10-CM

## 2017-12-14 DIAGNOSIS — E785 Hyperlipidemia, unspecified: Secondary | ICD-10-CM

## 2017-12-14 DIAGNOSIS — I6389 Other cerebral infarction: Secondary | ICD-10-CM

## 2017-12-14 DIAGNOSIS — N183 Chronic kidney disease, stage 3 (moderate): Secondary | ICD-10-CM

## 2017-12-14 HISTORY — PX: TEE WITHOUT CARDIOVERSION: SHX5443

## 2017-12-14 HISTORY — PX: LOOP RECORDER INSERTION: EP1214

## 2017-12-14 LAB — CBC
HCT: 45.7 % (ref 39.0–52.0)
Hemoglobin: 14.9 g/dL (ref 13.0–17.0)
MCH: 27.7 pg (ref 26.0–34.0)
MCHC: 32.6 g/dL (ref 30.0–36.0)
MCV: 84.9 fL (ref 78.0–100.0)
PLATELETS: 212 10*3/uL (ref 150–400)
RBC: 5.38 MIL/uL (ref 4.22–5.81)
RDW: 14.9 % (ref 11.5–15.5)
WBC: 12.7 10*3/uL — ABNORMAL HIGH (ref 4.0–10.5)

## 2017-12-14 LAB — MAGNESIUM: Magnesium: 2.1 mg/dL (ref 1.7–2.4)

## 2017-12-14 LAB — BASIC METABOLIC PANEL
ANION GAP: 10 (ref 5–15)
BUN: 28 mg/dL — ABNORMAL HIGH (ref 8–23)
CALCIUM: 9.2 mg/dL (ref 8.9–10.3)
CO2: 27 mmol/L (ref 22–32)
Chloride: 102 mmol/L (ref 98–111)
Creatinine, Ser: 1.93 mg/dL — ABNORMAL HIGH (ref 0.61–1.24)
GFR calc Af Amer: 41 mL/min — ABNORMAL LOW (ref >60)
GFR calc non Af Amer: 35 mL/min — ABNORMAL LOW (ref >60)
GLUCOSE: 125 mg/dL — AB (ref 70–99)
Potassium: 3.9 mmol/L (ref 3.5–5.1)
SODIUM: 139 mmol/L (ref 135–145)

## 2017-12-14 SURGERY — ECHOCARDIOGRAM, TRANSESOPHAGEAL
Anesthesia: Moderate Sedation

## 2017-12-14 SURGERY — LOOP RECORDER INSERTION

## 2017-12-14 MED ORDER — MIDAZOLAM HCL 10 MG/2ML IJ SOLN
INTRAMUSCULAR | Status: DC | PRN
  Administered 2017-12-14 (×2): 2 mg via INTRAVENOUS
  Administered 2017-12-14: 1 mg via INTRAVENOUS

## 2017-12-14 MED ORDER — SPIRONOLACTONE 25 MG PO TABS: 12.5000 mg | ORAL_TABLET | Freq: Every day | ORAL | 0 refills | Status: DC

## 2017-12-14 MED ORDER — FENTANYL CITRATE (PF) 100 MCG/2ML IJ SOLN
INTRAMUSCULAR | Status: AC
  Filled 2017-12-14: qty 4

## 2017-12-14 MED ORDER — FENTANYL CITRATE (PF) 100 MCG/2ML IJ SOLN
INTRAMUSCULAR | Status: DC | PRN
  Administered 2017-12-14 (×2): 25 ug via INTRAVENOUS

## 2017-12-14 MED ORDER — CARVEDILOL 25 MG PO TABS: 25.0000 mg | ORAL_TABLET | Freq: Two times a day (BID) | ORAL | Status: DC

## 2017-12-14 MED ORDER — MIDAZOLAM HCL 5 MG/ML IJ SOLN
INTRAMUSCULAR | Status: AC
  Filled 2017-12-14: qty 2

## 2017-12-14 MED ORDER — SODIUM CHLORIDE 0.9 % IV SOLN
INTRAVENOUS | Status: DC
  Administered 2017-12-14: 14:00:00 via INTRAVENOUS

## 2017-12-14 MED ORDER — BUTAMBEN-TETRACAINE-BENZOCAINE 2-2-14 % EX AERO
INHALATION_SPRAY | CUTANEOUS | Status: DC | PRN
  Administered 2017-12-14: 2 via TOPICAL

## 2017-12-14 MED ORDER — LIDOCAINE-EPINEPHRINE 1 %-1:100000 IJ SOLN
INTRAMUSCULAR | Status: AC
  Filled 2017-12-14: qty 1

## 2017-12-14 MED ORDER — LABETALOL HCL 5 MG/ML IV SOLN: 5.0000 mg | INTRAVENOUS | Status: DC | PRN

## 2017-12-14 MED ORDER — HYDRALAZINE HCL 50 MG PO TABS
100.0000 mg | ORAL_TABLET | Freq: Three times a day (TID) | ORAL | Status: DC
  Administered 2017-12-14: 100 mg via ORAL
  Filled 2017-12-14: qty 2

## 2017-12-14 MED ORDER — ASPIRIN 81 MG PO TBEC: 81.0000 mg | DELAYED_RELEASE_TABLET | Freq: Every day | ORAL | 0 refills | Status: AC

## 2017-12-14 MED ORDER — HYDRALAZINE HCL 20 MG/ML IJ SOLN
5.0000 mg | Freq: Once | INTRAMUSCULAR | Status: AC
  Administered 2017-12-14: 5 mg via INTRAVENOUS
  Filled 2017-12-14: qty 1

## 2017-12-14 MED ORDER — ATORVASTATIN CALCIUM 80 MG PO TABS: 80.0000 mg | ORAL_TABLET | Freq: Every day | ORAL | 0 refills | Status: DC

## 2017-12-14 MED ORDER — CLOPIDOGREL BISULFATE 75 MG PO TABS: 75.0000 mg | ORAL_TABLET | Freq: Every day | ORAL | 0 refills | Status: AC

## 2017-12-14 MED ORDER — LIDOCAINE-EPINEPHRINE 1 %-1:100000 IJ SOLN
INTRAMUSCULAR | Status: DC | PRN
  Administered 2017-12-14: 30 mL

## 2017-12-14 SURGICAL SUPPLY — 2 items
LOOP REVEAL LINQSYS (Prosthesis & Implant Heart) ×1 IMPLANT
PACK LOOP INSERTION (CUSTOM PROCEDURE TRAY) ×2 IMPLANT

## 2017-12-14 NOTE — Discharge Summary (Signed)
Physician Discharge Summary  Billy [name] WNI:[id number] DOB: [birth year] DOA: 12/12/2017  PCP: Damaris Hippo, MD (Inactive)  Admit date: 12/12/2017 Discharge date: 12/14/2017  Time spent: 40 minutes  Recommendations for Outpatient Follow-up:  1. Follow up CBC/CMP (attention to creatinine/K with spironolactone and baseline CKD) 2. Follow up with neurology as outpatient for stroke 3. Pt to complete 21 days aspirin/plavix, then aspirin alone 4. Follow up with cardiology for basal and inferolateral akinesis on echo 5. Follow up with cardiology for loop recorder 6.  Continue to follow blood pressure and adjust medications as indicated 7. Follow up carotid artery stenosis as outpatient (vascular referral provided)  Discharge Diagnoses:  Principal Problem:   Transient neurologic deficit Active Problems:   Kidney disease, chronic, stage III (GFR 30-59 ml/min) (HCC)   Hypertension   Cerebral embolism with cerebral infarction   Discharge Condition: stable  Diet recommendation: heart healthy  Filed Weights   12/12/17 0124 12/14/17 1400  Weight: 77.1 kg 74.8 kg    History of present illness:  Billy [name] right-handed66 y.o.malewith medical history significant forhistory of DVT status post 6 months Eliquis, renal artery stenosis with chronic kidney disease stage III, and hypertension, now presenting to the emergency department after an episode of stuttering speech and numbness and weakness involving the right hand. Patient reports that he was seated at home watching TV and in his usual state when he developed the acute onset of speech difficulty, described by his sister as stuttering, and noted that his right hand was "drawing up." He also reported experiencing numbness and weakness in the right hand. He denies any chest pain, palpitations, recent fevers or chills, nausea, vomiting, shortness of breath. No recent fall or trauma. Had recently been started on gabapentin for Kodee Drury neuropathy of  uncertain etiology that is being evaluated by his outpatient neurologist. Symptoms have almost completely resolved after approximately 30 minutes.  He was admitted for Aeva Posey stroke. His symptoms have resolved at this time.  Thought to be embolic 2/2 unknown source.  Telemetry, bilateral LE [place], echo, and TEE unrevealing for source.  Pt discharged with loop recorder.    See below for additional details     Hospital Course:  Transient neurologic deficits  L MCA PCA ACA watershed infarcts thought to be embolic 2/2 unknown source -Presents after an episode of speech disturbance and numbness/weakness involving the right hand that lasted ~30 minutes -Neurology consulting and much appreciated -MRI ofbrainovernight showed punctate foci of acute/early subacute infaction within L occipital lobe, L posterior frontal subcortical white matter, L precentral gyrus, and L postcentral gyrus (see report)  - MRA brain showed "no evidence of high-grade stenosis, large vessel occlusion, or aneurysm".  - Carotid dopplers with 40-59% stenosis in bilateral ICA, bilateral antegrade flow within vertebral arteries - Negative LE dopplers - echo with normal EF, basal to mid inferolateral hypokinesis, basal inferior akinesis.  Diastolic dysfunction (see report) - A1c 5.3, LDL 140  - Lipitor 80 mg daily.  DAPT x 3 weeks then aspirin alone.  - TEE without apparent source of embolus.  Echo notable for basal inferior and inferolateral akinesis (see report). 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  Will restart spironolactone at lower dose given his kidney function, this will need close follow up (discussed with pt)  Leukocytosis: follow, no si/sx infection  Procedures: Echo Study Conclusions  - Left ventricle: The cavity size was normal. Wall thickness was   increased in Kinta Martis pattern of mild LVH. Basal to mid inferolateral   hypokinesis. Basal inferior akinesis. Systolic function was   normal. The estimated ejection fraction was 60%. Features are   consistent with Jim Lundin pseudonormal left ventricular filling pattern,   with concomitant abnormal relaxation and increased filling   pressure (grade 2 diastolic dysfunction). - Aortic valve: There was no stenosis. - Mitral valve: There was trivial regurgitation. - Left atrium: The atrium was mildly dilated. - Right ventricle: The cavity size was normal. Systolic function   was normal. - Right atrium: The atrium was mildly dilated. - Pulmonary arteries: No complete TR doppler jet so unable to   estimate PA systolic pressure. - Inferior vena cava: The vessel was normal in size. The   respirophasic diameter changes were in the normal range (>= 50%),   consistent with normal central venous pressure.  Impressions:  - Normal LV size with mild LV hypertrophy. EF 60%. Basal to mid   inferolateral hypokinesis, basal infererior akinesis. Moderate   diastolic dysfunction. Normal RV size and systolic function. No   significant valvular abnormalities.  Procedure: TEE  Indication: CVA  Sedation: Versed 5 mg IV, Fentanyl 50 mcg IV  Findings: Please see echo section for full report.  Normal LV size with moderate LV hypertrophy.  EF 55-60%, basal inferior and inferolateral akinesis.  Normal RV size and systolic function.  Normal right and left atrial  sizes.  No LA appendage thrombus.  No evidence for ASD/PFO, negative bubble study.  No significant tricuspid regurgitation.  Trivial pulmonic insufficiency.  Trileaflet aortic valve with no stenosis or regurgitation.  Trivial mitral regurgitation.  Normal caliber thoracic aorta with mild plaque.    No source of embolus noted.  Final Interpretation: Right Carotid: Velocities in the right ICA are consistent with Martavia Tye 40-59%        stenosis.  Left Carotid: Velocities in the left ICA are consistent with Kaide Gage 40-59% stenosis.  Vertebrals: Bilateral vertebral arteries demonstrate antegrade flow.  Loop recorder insertion 8/29  Consultations:  Cardiology  Neurology  Discharge Exam: Vitals:   12/14/17 1450 12/14/17 1500  BP:  (!) 174/70  Pulse:  69  Resp: 19 20  Temp:    SpO2:  98%   Feeling well, no complaints. Son and ex wife at bedside.  General: No acute distress. Cardiovascular: Heart sounds show Charlii Yost regular rate, and rhythm. No gallops or rubs. No murmurs. No JVD. Lungs: Clear to auscultation bilaterally with good air movement. No rales, rhonchi or wheezes. Abdomen: Soft, nontender, nondistended with normal active bowel sounds. No masses. No hepatosplenomegaly. Neurological: Alert and oriented 3. Moves all extremities 4 with equal strength. Cranial nerves II through XII intact. Skin: Warm and dry. No rashes or lesions. Extremities: No clubbing or cyanosis. No edema.  Psychiatric: Mood and affect are normal. Insight and judgment are appropriate.  Discharge Instructions   Discharge Instructions    Ambulatory referral to Neurology   Complete by:  As directed    Follow up with stroke clinic NP (Jessica Vanschaick or Cecille Rubin, if both  not available, consider Dr. Antony Contras, Dr. Bess Harvest, or Dr. Sarina Ill) at [facility] in about 4 weeks.   Ambulatory referral to Vascular Surgery   Complete by:  As directed    Call MD for:  difficulty  breathing, headache or visual disturbances   Complete by:  As directed    Call MD for:  extreme fatigue   Complete by:  As directed    Call MD for:  persistant dizziness or light-headedness   Complete by:  As directed    Call MD for:  persistant nausea and vomiting   Complete by:  As directed    Call MD for:  redness, tenderness, or signs of infection (pain, swelling, redness, odor or green/yellow discharge around incision site)   Complete by:  As directed    Call MD for:  severe uncontrolled pain   Complete by:  As directed    Call MD for:  temperature >100.4   Complete by:  As directed    Diet - low sodium heart healthy   Complete by:  As directed    Discharge instructions   Complete by:  As directed    You were seen for Billy [name] stroke.  Your symptoms have improved.  We started you on aspirin and plavix.  You've also been started on atorvastatin (lipitor).  You'll take these together for 21 days (19 days left now with the doses you've received in the hospital) and then transition to aspirin alone.  You had Glori Machnik loop recorder placed to help find Darrell Hauk cause for the stroke.  Please follow up with neurology as an outpatient as scheduled.  We held your blood pressure medications for Meegan Shanafelt few days.  We've restarted your hydralazine here.  Please continue to restart your medications gradually over the next few days.  We restarted your hydralazine today.  You can start your coreg when you get home.  I'll restart your spironolactone at Arek Spadafore lower dose (please follow up with your PCP within Earle Troiano few days for recheck labs to make sure your kidney function and electrolytes look ok).  Please follow up with your PCP and/or your nephrologist to see if this should be adjusted (or another one of your medicines adjusted).    Cardiology has arranged follow up with you because your echo showed some abnormal wall movements.  I'd like you to follow up with vascular surgery as well for your carotid stenosis.   Please follow up  with your primary care doctor.  They can help follow your blood pressure and adjust this as needed.    Return for new, recurrent, or worsening symptoms.   Please ask your PCP to request records from this hospitalization so they know what was done and what the next steps will be.   Increase activity slowly   Complete by:  As directed      Allergies as of 12/14/2017      Reactions   Morphine And Related Other (See Comments)   "Can not handle it"      Medication List    TAKE these medications   aspirin 81 MG EC tablet Take 1 tablet (81 mg total) by mouth daily. Start taking on:  12/15/2017   atorvastatin 80 MG tablet Commonly known as:  LIPITOR Take 1 tablet (80 mg total) by mouth daily at 6 PM.   carvedilol 25 MG tablet Commonly known as:  COREG Take 25 mg by mouth 2 (two) times daily with Harris Kistler meal.   clopidogrel  75 MG tablet Commonly known as:  PLAVIX Take 1 tablet (75 mg total) by mouth daily for 19 days. Start taking on:  12/15/2017   gabapentin 100 MG capsule Commonly known as:  NEURONTIN Take 2 capsules (200 mg total) by mouth at bedtime.   hydrALAZINE 100 MG tablet Commonly known as:  APRESOLINE Take 100 mg by mouth 3 (three) times daily.   spironolactone 25 MG tablet Commonly known as:  ALDACTONE Take 0.5 tablets (12.5 mg total) by mouth daily. What changed:  how much to take      Allergies  Allergen Reactions  . Morphine And Related Other (See Comments)    "Can not handle it"   Follow-up Information    [facility] Follow up in 4 week(s).   Specialty:  Neurology Why:  stroke clinic. office will call with appt date and time. Contact information: [place] [phone]       [facility] Follow up on 12/28/2017.   Specialty:  Cardiology Why:  11:00AM, wound check visit Contact information: [address]  [place]       Lorretta Harp, MD Follow up on 01/17/2018.   Specialties:  Cardiology, Radiology Why:  11:15AM Contact information: [address] [place] [address] [phone]        Damaris Hippo, MD Follow up.   Specialty:  Family Medicine Why:  Please call for Lateefa Crosby follow up with your PCP within 1 week Contact information: [place] [address] [phone]        [facility] Follow up.   Specialty:  Vascular Surgery Why:  If you don't get Ezabella Teska phone call within the next 2-3 weeks, please call  Contact information: [address] [place] [phone]           The results of significant diagnostics from this hospitalization (including imaging, microbiology, ancillary and laboratory) are listed below for reference.    Significant Diagnostic Studies: Mr Virgel Paling AJ Contrast  Result Date: 12/12/2017 CLINICAL DATA:  67 y/o M; episode of stuttering speech as well as numbness and weakness of the right hand. EXAM: MRI HEAD WITHOUT CONTRAST MRA HEAD WITHOUT CONTRAST TECHNIQUE: Multiplanar, multiecho pulse sequences of the brain and surrounding structures were obtained without intravenous contrast. Angiographic images of the head were obtained using MRA technique without contrast. COMPARISON:  06/29/2017 CT head. FINDINGS: MRI HEAD FINDINGS Brain: Punctate foci of reduced diffusion are present within the left occipital lobe, left posterior frontal subcortical white matter, and the cortices of both the left pre and postcentral gyri. No associated hemorrhage or mass effect. Few nonspecific T2 FLAIR hyperintensities in subcortical and periventricular white matter are compatible with mild chronic microvascular ischemic changes for age. Mild volume loss of the brain. No extra-axial collection, hydrocephalus, or herniation. Vascular: As below. Skull and upper cervical spine:  Normal marrow signal. Sinuses/Orbits: Negative. Other: Left intra-ocular lens replacement. MRA HEAD FINDINGS Internal carotid arteries:  Patent. Anterior cerebral arteries:  Patent. Middle cerebral arteries: Patent. Anterior communicating artery: Patent. Posterior communicating arteries:  Patent. Posterior cerebral arteries:  Patent. Basilar artery:  Patent. Vertebral arteries:  Patent. No evidence of high-grade stenosis, large vessel occlusion, or aneurysm unless noted above. IMPRESSION: 1. Punctate foci of acute/early subacute infarction within the left occipital lobe, left posterior frontal subcortical white matter, left precentral gyrus, and left  postcentral gyrus. Multiple vascular territories suggests embolic etiology. No associated hemorrhage or mass effect. 2. Mild chronic microvascular ischemic changes and volume loss of the brain. 3. Normal MRA of the head. These results were called by telephone at the time of interpretation on 12/12/2017 at 0:25 am to Dr. Roxanne Mins , who verbally acknowledged these results. Electronically Signed   By: Kristine Garbe M.D.   On: 12/12/2017 05:00   Mr Brain Wo Contrast  Result Date: 12/12/2017 CLINICAL DATA:  67 y/o M; episode of stuttering speech as well as numbness and weakness of the right hand. EXAM: MRI HEAD WITHOUT CONTRAST MRA HEAD WITHOUT CONTRAST TECHNIQUE: Multiplanar, multiecho pulse sequences of the brain and surrounding structures were obtained without intravenous contrast. Angiographic images of the head were obtained using MRA technique without contrast. COMPARISON:  06/29/2017 CT head. FINDINGS: MRI HEAD FINDINGS Brain: Punctate foci of reduced diffusion are present within the left occipital lobe, left posterior frontal subcortical white matter, and the cortices of both the left pre and postcentral gyri. No associated hemorrhage or mass effect. Few nonspecific T2 FLAIR hyperintensities in subcortical and periventricular white matter are compatible  with mild chronic microvascular ischemic changes for age. Mild volume loss of the brain. No extra-axial collection, hydrocephalus, or herniation. Vascular: As below. Skull and upper cervical spine: Normal marrow signal. Sinuses/Orbits: Negative. Other: Left intra-ocular lens replacement. MRA HEAD FINDINGS Internal carotid arteries:  Patent. Anterior cerebral arteries:  Patent. Middle cerebral arteries: Patent. Anterior communicating artery: Patent. Posterior communicating arteries:  Patent. Posterior cerebral arteries:  Patent. Basilar artery:  Patent. Vertebral arteries:  Patent. No evidence of high-grade stenosis, large vessel occlusion, or aneurysm unless noted above. IMPRESSION: 1. Punctate foci of acute/early subacute infarction within the left occipital lobe, left posterior frontal subcortical white matter, left precentral gyrus, and left postcentral gyrus. Multiple vascular territories suggests embolic etiology. No associated hemorrhage or mass effect. 2. Mild chronic microvascular ischemic changes and volume loss of the brain. 3. Normal MRA of the head. These results were called by telephone at the time of interpretation on 12/12/2017 at 4:27 am to Dr. Roxanne Mins , who verbally acknowledged these results. Electronically Signed   By: Kristine Garbe M.D.   On: 12/12/2017 05:00    Microbiology: No results found for this or any previous visit (from the past 240 hour(s)).   Labs: Basic Metabolic Panel: Recent Labs  Lab 12/12/17 0125 12/12/17 0144 12/12/17 0600 12/13/17 1022 12/14/17 0251  NA 136 134* 139 142 139  K 4.9 4.7 3.8 3.8 3.9  CL 103 104 107 109 102  CO2 23  --  24 27 27   GLUCOSE 109* 107* 115* 111* 125*  BUN 35* 51* 32* 28* 28*  CREATININE 2.22* 2.40* 2.04* 1.88* 1.93*  CALCIUM 9.0  --  8.9 8.9 9.2  MG  --   --   --   --  2.1   Liver Function Tests: Recent Labs  Lab 12/12/17 0125  AST 70*  ALT 27  ALKPHOS 39  BILITOT 1.0  PROT 5.9*  ALBUMIN 3.4*   No results for  input(s): LIPASE, AMYLASE in the last 168 hours. No results for input(s): AMMONIA in the last 168 hours. CBC: Recent Labs  Lab 12/12/17 0125 12/12/17 0144 12/13/17 1022 12/14/17 0251  WBC 12.2*  --  8.8 12.7*  NEUTROABS 9.6*  --   --   --   HGB 13.7 13.6 14.1 14.9  HCT 42.9 40.0 43.5 45.7  MCV 87.2  --  85.5  84.9  PLT 187  --  167 212   Cardiac Enzymes: No results for input(s): CKTOTAL, CKMB, CKMBINDEX, TROPONINI in the last 168 hours. BNP: BNP (last 3 results) No results for input(s): BNP in the last 8760 hours.  ProBNP (last 3 results) No results for input(s): PROBNP in the last 8760 hours.  CBG: Recent Labs  Lab 12/12/17 0137  GLUCAP 110*       Signed:  Fayrene Helper MD.  [facility] 12/14/2017, 5:37 PM

## 2017-12-14 NOTE — Progress Notes (Signed)
PROGRESS NOTE    Billy [name]  RSW:[id number] DOB: 01-22-1951 DOA: 12/12/2017 PCP: Damaris Hippo, MD (Inactive)    Brief Narrative: Billy [name] is a right-handed 67 y.o. male with medical history significant for history of DVT status post 6 months Eliquis, renal artery stenosis with chronic kidney disease stage III, and hypertension, now presenting to the emergency department after an episode of stuttering speech and numbness and weakness involving the right hand. Patient reports that he was seated at home watching TV and in his usual state when he developed the acute onset of speech difficulty, described by his sister as stuttering, and noted that his right hand was "drawing up."  He also reported experiencing numbness and weakness in the right hand. He denies any chest pain, palpitations, recent fevers or chills, nausea, vomiting, shortness of breath. No recent fall or trauma. Had recently been started on gabapentin for a neuropathy of uncertain etiology that is being evaluated by his outpatient neurologist. Symptoms have almost completely resolved after approximately 30 minutes.  Assessment & Plan:   Principal Problem:   Transient neurologic deficit Active Problems:   Kidney disease, chronic, stage III (GFR 30-59 ml/min) (HCC)   Hypertension   Cerebral embolism with cerebral infarction   Transient neurologic deficits  L MCA PCA ACA watershed infarcts thought to be embolic 2/2 unknown source -Presents after an episode of speech disturbance and numbness/weakness involving the right hand that lasted ~30 minutes -Neurology consulting and much appreciated - MRI of brain overnight showed punctate foci of acute/early subacute infaction within L occipital lobe, L posterior frontal subcortical white matter, L precentral gyrus, and L postcentral gyrus (see report)   - MRA brain showed "no evidence of high-grade stenosis, large vessel occlusion, or aneurysm".   - Carotid dopplers with 40-59%  stenosis in bilateral ICA, bilateral antegrade flow within vertebral arteries - Negative LE dopplers - echo with normal EF, basal to mid inferolateral hypokinesis, basal inferior akinesis.  Diastolic dysfunction (see report) - A1c 5.3, LDL 140  - Lipitor 80 mg daily.  DAPT x 3 weeks then aspirin alone.  - At this time, planning for TEE/loop recorder for further cardioembolic work up.  Likely will be ready for d/c later today once this is complete.  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 - follow creatinine   Hypertension - BP's into the 093'G systolic last night - currently out of permissive HTN time period (24-48 hours) - will plan to restart hydralazine today and gradually reintroduce meds over next few days - currently holding home Coreg, and Aldactone - labetalol for SBP > 220  Leukocytosis: follow, no si/sx infection  DVT prophylaxis: lovenox Code Status: full  Family Communication: none at bedside Disposition Plan: pending further stroke w/u, home after TEE/loop recorder placement today   Consultants:   Cardiology  Neurology  Procedures:  Study Conclusions  - Left ventricle: The cavity size was normal. Wall thickness was   increased in a pattern of mild LVH. Basal to mid inferolateral   hypokinesis. Basal inferior akinesis. Systolic function was   normal. The estimated ejection fraction was 60%. Features are   consistent with a pseudonormal left ventricular filling pattern,   with concomitant abnormal relaxation and increased filling   pressure (grade 2 diastolic dysfunction). - Aortic valve: There was no stenosis. -  Mitral valve: There was trivial regurgitation. - Left atrium: The atrium was mildly dilated. - Right ventricle:  The cavity size was normal. Systolic function   was normal. - Right atrium: The atrium was mildly dilated. - Pulmonary arteries: No complete TR doppler jet so unable to   estimate PA systolic pressure. - Inferior vena cava: The vessel was normal in size. The   respirophasic diameter changes were in the normal range (>= 50%),   consistent with normal central venous pressure.  Impressions:  - Normal LV size with mild LV hypertrophy. EF 60%. Basal to mid   inferolateral hypokinesis, basal infererior akinesis. Moderate   diastolic dysfunction. Normal RV size and systolic function. No   significant valvular abnormalities.  Final Interpretation: Right Carotid: Velocities in the right ICA are consistent with a 40-59%        stenosis.  Left Carotid: Velocities in the left ICA are consistent with a 40-59% stenosis.  Vertebrals: Bilateral vertebral arteries demonstrate antegrade flow.  Final Interpretation: Right: There is no evidence of deep vein thrombosis in the lower extremity. No cystic structure found in the popliteal fossa. Left: There is no evidence of deep vein thrombosis in the lower extremity. No cystic structure found in the popliteal fossa.  Antimicrobials:  Anti-infectives (From admission, onward)   None     Subjective: Had some gas/reflux last night, otherwise feeling well now.  Objective: Vitals:   12/13/17 2024 12/14/17 0305 12/14/17 0306 12/14/17 0805  BP: (!) 214/106 (!) 220/105 (!) 219/108 (!) 204/102  Pulse: 65 66 64 62  Resp: 12 10 11 15   Temp: 97.9 F (36.6 C)  97.6 F (36.4 C) 98.4 F (36.9 C)  TempSrc: Oral  Oral Oral  SpO2: 100% 98% 96% 98%  Weight:      Height:        Intake/Output Summary (Last 24 hours) at 12/14/2017 1106 Last data filed at 12/13/2017 1845 Gross per 24 hour  Intake 360 ml  Output -  Net 360 ml   Filed Weights   12/12/17 0124  Weight: 77.1 kg    Examination:  General: No acute distress. Cardiovascular: Heart  sounds show a regular rate, and rhythm.  Lungs: Clear to auscultation bilaterally Abdomen: Soft, nontender, nondistended Neurological: Alert and oriented 3. Moves all extremities 4 with equal strength. Cranial nerves II through XII intact.  Sensation intact to light touch. Skin: Warm and dry. No rashes or lesions. Extremities: No clubbing or cyanosis. No edema. Pedal pulses 2+. Psychiatric: Mood and affect are normal. Insight and judgment are appropriate.   Data Reviewed: I have personally reviewed following labs and imaging studies  CBC: Recent Labs  Lab 12/12/17 0125 12/12/17 0144 12/13/17 1022 12/14/17 0251  WBC 12.2*  --  8.8 12.7*  NEUTROABS 9.6*  --   --   --   HGB 13.7 13.6 14.1 14.9  HCT 42.9 40.0 43.5 45.7  MCV 87.2  --  85.5 84.9  PLT 187  --  167 720   Basic Metabolic Panel: Recent Labs  Lab 12/12/17 0125 12/12/17 0144 12/12/17 0600 12/13/17 1022 12/14/17 0251  NA 136 134* 139 142 139  K 4.9 4.7 3.8 3.8 3.9  CL 103 104 107 109 102  CO2 23  --  24 27 27   GLUCOSE 109* 107* 115* 111* 125*  BUN 35* 51* 32* 28* 28*  CREATININE 2.22* 2.40* 2.04* 1.88* 1.93*  CALCIUM 9.0  --  8.9 8.9 9.2  MG  --   --   --   --  2.1   GFR: Estimated Creatinine Clearance: 42.2 mL/min (A) (by C-G formula based on SCr of 1.93 mg/dL (H)). Liver Function Tests: Recent Labs  Lab 12/12/17 0125  AST 70*  ALT 27  ALKPHOS 39  BILITOT 1.0  PROT 5.9*  ALBUMIN 3.4*   No results for input(s): LIPASE, AMYLASE in the last 168 hours. No results for input(s): AMMONIA in the last 168 hours. Coagulation Profile: Recent Labs  Lab 12/12/17 0125  INR 0.99   Cardiac Enzymes: No results for input(s): CKTOTAL, CKMB, CKMBINDEX, TROPONINI in the last 168 hours. BNP (last 3 results) No results for input(s): PROBNP in the last 8760 hours. HbA1C: Recent Labs    12/12/17 0600  HGBA1C 5.3   CBG: Recent Labs  Lab 12/12/17 0137  GLUCAP 110*   Lipid Profile: Recent Labs     12/12/17 0600  CHOL 204*  HDL 51  LDLCALC 140*  TRIG 65  CHOLHDL 4.0   Thyroid Function Tests: Recent Labs    12/12/17 0600  TSH 1.198   Anemia Panel: No results for input(s): VITAMINB12, FOLATE, FERRITIN, TIBC, IRON, RETICCTPCT in the last 72 hours. Sepsis Labs: No results for input(s): PROCALCITON, LATICACIDVEN in the last 168 hours.  No results found for this or any previous visit (from the past 240 hour(s)).       Radiology Studies: No results found.      Scheduled Meds: .  stroke: mapping our early stages of recovery book   Does not apply Once  . aspirin EC  81 mg Oral Daily  . atorvastatin  80 mg Oral q1800  . clopidogrel  75 mg Oral Daily  . hydrALAZINE  100 mg Oral TID   Continuous Infusions:   LOS: 1 day    Time spent: over 30 min Mod MDM with multiple medical issues    Fayrene Helper, MD [facility] Pager [phone]  If 7PM-7AM, please contact night-coverage [email or web address] Password [name] 12/14/2017, 11:06 AM
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
## 2017-12-14 NOTE — Interval H&P Note (Signed)
History and Physical Interval Note:  12/14/2017 2:30 PM  Billy [name]  has presented today for surgery, with the diagnosis of STROKE  The various methods of treatment have been discussed with the patient and family. After consideration of risks, benefits and other options for treatment, the patient has consented to  Procedure(s): TRANSESOPHAGEAL ECHOCARDIOGRAM (TEE) (N/A) as a surgical intervention .  The patient's history has been reviewed, patient examined, no change in status, stable for surgery.  I have reviewed the patient's chart and labs.  Questions were answered to the patient's satisfaction.     Johnsie Moscoso [employer]
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
## 2017-12-14 NOTE — H&P (View-Only) (Signed)
PROGRESS NOTE    Billy [name]  VOZ:[id number] DOB: January 31, 1951 DOA: 12/12/2017 PCP: Damaris Hippo, MD (Inactive)    Brief Narrative: Amarri Satterly is a right-handed 67 y.o. male with medical history significant for history of DVT status post 6 months Eliquis, renal artery stenosis with chronic kidney disease stage III, and hypertension, now presenting to the emergency department after an episode of stuttering speech and numbness and weakness involving the right hand. Patient reports that he was seated at home watching TV and in his usual state when he developed the acute onset of speech difficulty, described by his sister as stuttering, and noted that his right hand was "drawing up."  He also reported experiencing numbness and weakness in the right hand. He denies any chest pain, palpitations, recent fevers or chills, nausea, vomiting, shortness of breath. No recent fall or trauma. Had recently been started on gabapentin for a neuropathy of uncertain etiology that is being evaluated by his outpatient neurologist. Symptoms have almost completely resolved after approximately 30 minutes.  Assessment & Plan:   Principal Problem:   Transient neurologic deficit Active Problems:   Kidney disease, chronic, stage III (GFR 30-59 ml/min) ([facility])   Hypertension   Cerebral embolism with cerebral infarction   Transient neurologic deficits  L MCA PCA ACA watershed infarcts thought to be embolic 2/2 unknown source -Presents after an episode of speech disturbance and numbness/weakness involving the right hand that lasted ~30 minutes -Neurology consulting and much appreciated - MRI of brain overnight showed punctate foci of acute/early subacute infaction within L occipital lobe, L posterior frontal subcortical white matter, L precentral gyrus, and L postcentral gyrus (see report)   - MRA brain showed "no evidence of high-grade stenosis, large vessel occlusion, or aneurysm".   - Carotid dopplers with 40-59%  stenosis in bilateral ICA, bilateral antegrade flow within vertebral arteries - Negative LE dopplers - echo with normal EF, basal to mid inferolateral hypokinesis, basal inferior akinesis.  Diastolic dysfunction (see report) - A1c 5.3, LDL 140  - Lipitor 80 mg daily.  DAPT x 3 weeks then aspirin alone.  - At this time, planning for TEE/loop recorder for further cardioembolic work up.  Likely will be ready for d/c later today once this is complete.  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 - follow creatinine   Hypertension - BP's into the 425'Z systolic last night - currently out of permissive HTN time period (24-48 hours) - will plan to restart hydralazine today and gradually reintroduce meds over next few days - currently holding home Coreg, and Aldactone - labetalol for SBP > 220  Leukocytosis: follow, no si/sx infection  DVT prophylaxis: lovenox Code Status: full  Family Communication: none at bedside Disposition Plan: pending further stroke w/u, home after TEE/loop recorder placement today   Consultants:   Cardiology  Neurology  Procedures:  Study Conclusions  - Left ventricle: The cavity size was normal. Wall thickness was   increased in a pattern of mild LVH. Basal to mid inferolateral   hypokinesis. Basal inferior akinesis. Systolic function was   normal. The estimated ejection fraction was 60%. Features are   consistent with a pseudonormal left ventricular filling pattern,   with concomitant abnormal relaxation and increased filling   pressure (grade 2 diastolic dysfunction). - Aortic valve: There was no stenosis. -  Mitral valve: There was trivial regurgitation. - Left atrium: The atrium was mildly dilated. - Right ventricle:  The cavity size was normal. Systolic function   was normal. - Right atrium: The atrium was mildly dilated. - Pulmonary arteries: No complete TR doppler jet so unable to   estimate PA systolic pressure. - Inferior vena cava: The vessel was normal in size. The   respirophasic diameter changes were in the normal range (>= 50%),   consistent with normal central venous pressure.  Impressions:  - Normal LV size with mild LV hypertrophy. EF 60%. Basal to mid   inferolateral hypokinesis, basal infererior akinesis. Moderate   diastolic dysfunction. Normal RV size and systolic function. No   significant valvular abnormalities.  Final Interpretation: Right Carotid: Velocities in the right ICA are consistent with a 40-59%        stenosis.  Left Carotid: Velocities in the left ICA are consistent with a 40-59% stenosis.  Vertebrals: Bilateral vertebral arteries demonstrate antegrade flow.  Final Interpretation: Right: There is no evidence of deep vein thrombosis in the lower extremity. No cystic structure found in the popliteal fossa. Left: There is no evidence of deep vein thrombosis in the lower extremity. No cystic structure found in the popliteal fossa.  Antimicrobials:  Anti-infectives (From admission, onward)   None     Subjective: Had some gas/reflux last night, otherwise feeling well now.  Objective: Vitals:   12/13/17 2024 12/14/17 0305 12/14/17 0306 12/14/17 0805  BP: (!) 214/106 (!) 220/105 (!) 219/108 (!) 204/102  Pulse: 65 66 64 62  Resp: 12 10 11 15   Temp: 97.9 F (36.6 C)  97.6 F (36.4 C) 98.4 F (36.9 C)  TempSrc: Oral  Oral Oral  SpO2: 100% 98% 96% 98%  Weight:      Height:        Intake/Output Summary (Last 24 hours) at 12/14/2017 1106 Last data filed at 12/13/2017 1845 Gross per 24 hour  Intake 360 ml  Output -  Net 360 ml   Filed Weights   12/12/17 0124  Weight: 77.1 kg    Examination:  General: No acute distress. Cardiovascular: Heart  sounds show a regular rate, and rhythm.  Lungs: Clear to auscultation bilaterally Abdomen: Soft, nontender, nondistended Neurological: Alert and oriented 3. Moves all extremities 4 with equal strength. Cranial nerves II through XII intact.  Sensation intact to light touch. Skin: Warm and dry. No rashes or lesions. Extremities: No clubbing or cyanosis. No edema. Pedal pulses 2+. Psychiatric: Mood and affect are normal. Insight and judgment are appropriate.   Data Reviewed: I have personally reviewed following labs and imaging studies  CBC: Recent Labs  Lab 12/12/17 0125 12/12/17 0144 12/13/17 1022 12/14/17 0251  WBC 12.2*  --  8.8 12.7*  NEUTROABS 9.6*  --   --   --   HGB 13.7 13.6 14.1 14.9  HCT 42.9 40.0 43.5 45.7  MCV 87.2  --  85.5 84.9  PLT 187  --  167 062   Basic Metabolic Panel: Recent Labs  Lab 12/12/17 0125 12/12/17 0144 12/12/17 0600 12/13/17 1022 12/14/17 0251  NA 136 134* 139 142 139  K 4.9 4.7 3.8 3.8 3.9  CL 103 104 107 109 102  CO2 23  --  24 27 27   GLUCOSE 109* 107* 115* 111* 125*  BUN 35* 51* 32* 28* 28*  CREATININE 2.22* 2.40* 2.04* 1.88* 1.93*  CALCIUM 9.0  --  8.9 8.9 9.2  MG  --   --   --   --  2.1   GFR: Estimated Creatinine Clearance: 42.2 mL/min (A) (by C-G formula based on SCr of 1.93 mg/dL (H)). Liver Function Tests: Recent Labs  Lab 12/12/17 0125  AST 70*  ALT 27  ALKPHOS 39  BILITOT 1.0  PROT 5.9*  ALBUMIN 3.4*   No results for input(s): LIPASE, AMYLASE in the last 168 hours. No results for input(s): AMMONIA in the last 168 hours. Coagulation Profile: Recent Labs  Lab 12/12/17 0125  INR 0.99   Cardiac Enzymes: No results for input(s): CKTOTAL, CKMB, CKMBINDEX, TROPONINI in the last 168 hours. BNP (last 3 results) No results for input(s): PROBNP in the last 8760 hours. HbA1C: Recent Labs    12/12/17 0600  HGBA1C 5.3   CBG: Recent Labs  Lab 12/12/17 0137  GLUCAP 110*   Lipid Profile: Recent Labs     12/12/17 0600  CHOL 204*  HDL 51  LDLCALC 140*  TRIG 65  CHOLHDL 4.0   Thyroid Function Tests: Recent Labs    12/12/17 0600  TSH 1.198   Anemia Panel: No results for input(s): VITAMINB12, FOLATE, FERRITIN, TIBC, IRON, RETICCTPCT in the last 72 hours. Sepsis Labs: No results for input(s): PROCALCITON, LATICACIDVEN in the last 168 hours.  No results found for this or any previous visit (from the past 240 hour(s)).       Radiology Studies: No results found.      Scheduled Meds: .  stroke: mapping our early stages of recovery book   Does not apply Once  . aspirin EC  81 mg Oral Daily  . atorvastatin  80 mg Oral q1800  . clopidogrel  75 mg Oral Daily  . hydrALAZINE  100 mg Oral TID   Continuous Infusions:   LOS: 1 day    Time spent: over 30 min Mod MDM with multiple medical issues    Fayrene Helper, MD [facility] Pager [phone]  If 7PM-7AM, please contact night-coverage [email or web address] Password [name] 12/14/2017, 11:06 AM

## 2017-12-14 NOTE — Interval H&P Note (Signed)
History and Physical Interval Note:  12/14/2017 1:35 PM  Billy [name]  has presented today for surgery, with the diagnosis of stroke  The various methods of treatment have been discussed with the patient and family. After consideration of risks, benefits and other options for treatment, the patient has consented to  Procedure(s): LOOP RECORDER INSERTION (N/A) as a surgical intervention .  The patient's history has been reviewed, patient examined, no change in status, stable for surgery.  I have reviewed the patient's chart and labs.  Questions were answered to the patient's satisfaction.     Thompson Grayer

## 2017-12-14 NOTE — Discharge Instructions (Signed)
Post implant site/wound care instructions °Keep incision clean and dry for 3 days. °You can remove outer dressing tomorrow. °Leave steri-strips (little pieces of tape) on until seen in the office for wound check appointment. °Call the office ([phone]) for redness, drainage, swelling, or fever. ° °

## 2017-12-14 NOTE — Progress Notes (Addendum)
STROKE TEAM PROGRESS NOTE   INTERVAL HISTORY EP APP at bedside, discussing TEE and loop placement. Pt engaged and understands. Pt states he slept poorly last night d/t indigestion and difficulty breathing (sinus issues). States he plans to see an ENT about his sinuses given ongoing difficulties with them. Neuro-wise, he is stable. He is back to baseline without any R arm deficits.  Vitals:   12/13/17 2024 12/14/17 0305 12/14/17 0306 12/14/17 0805  BP: (!) 214/106 (!) 220/105 (!) 219/108 (!) 204/102  Pulse: 65 66 64 62  Resp: 12 10 11 15   Temp: 97.9 F (36.6 C)  97.6 F (36.4 C) 98.4 F (36.9 C)  TempSrc: Oral  Oral Oral  SpO2: 100% 98% 96% 98%  Weight:      Height:        CBC:  Recent Labs  Lab 12/12/17 0125  12/13/17 1022 12/14/17 0251  WBC 12.2*  --  8.8 12.7*  NEUTROABS 9.6*  --   --   --   HGB 13.7   < > 14.1 14.9  HCT 42.9   < > 43.5 45.7  MCV 87.2  --  85.5 84.9  PLT 187  --  167 212   < > = values in this interval not displayed.    Basic Metabolic Panel:  Recent Labs  Lab 12/13/17 1022 12/14/17 0251  NA 142 139  K 3.8 3.9  CL 109 102  CO2 27 27  GLUCOSE 111* 125*  BUN 28* 28*  CREATININE 1.88* 1.93*  CALCIUM 8.9 9.2  MG  --  2.1    TEE - Normal LV size with mild LV hypertrophy. EF 60%. Basal to mid   inferolateral hypokinesis, basal infererior akinesis. Moderate   diastolic dysfunction. Normal RV size and systolic function. No   significant valvular abnormalities.  CUS Right Carotid: Velocities in the right ICA are consistent with a 40-59% stenosis. Left Carotid: Velocities in the left ICA are consistent with a 40-59% stenosis. Vertebrals: Bilateral vertebral arteries demonstrate antegrade flow.  LE venous doppler Right: There is no evidence of deep vein thrombosis in the lower extremity. No cystic structure found in the popliteal fossa. Left: There is no evidence of deep vein thrombosis in the lower extremity. No cystic structure found in the  popliteal fossa.  TEE Normal LV size with moderate LV hypertrophy.  EF 55-60%, basal inferior and inferolateral akinesis.  Normal RV size and systolic function.  Normal right and left atrial sizes.  No LA appendage thrombus.  No evidence for ASD/PFO, negative bubble study.  No significant tricuspid regurgitation.  Trivial pulmonic insufficiency.  Trileaflet aortic valve with no stenosis or regurgitation.  Trivial mitral regurgitation.  Normal caliber thoracic aorta with mild plaque.  PHYSICAL EXAM GENERAL: Awake, alert in NAD.   HEENT: - Normocephalic and atraumatic Ext: warm, well perfused, intact peripheral pulses, no frank edema in the extremities. R leg bigger than L leg (R leg with DVT 2017, bigger than L since that time)  NEURO:  Mental Status: AA&Ox3  Language: speech is clear, no stuttering noted.  Naming, repetition, fluency, and comprehension intact. Cranial Nerves: PERRL. EOMI, visual fields full, no facial asymmetry, facial sensation intact, hearing intact, tongue/uvula/soft palate midline, normal sternocleidomastoid and trapezius muscle strength. No evidence of tongue atrophy or fibrillations Motor: Right upper extremity, Right lower extremity, left upper extremity and left lower extremity are all 5/5 Tone: is normal and bulk is normal Sensation- intact bilaterraly face, arm, legs,  hand/fingers.  Coordination: FTN  intact bilaterally, no ataxia in BLE. Gait-steady Bilateral downgoing toes.    Has now returned to baseline.  ASSESSMENT/PLAN Billy [name] is a 67 y.o. male with history of DVT in 2017, HTN, CKD, BLE numbness presenting with R hand numbness and speech difficulties x 30 mins.   Stroke:   left MCA/PCA, MCA/ACA, and bilateral MCA punctate infarcts felt to be embolic secondary to an unknown source  MRI  Punctate L occipital, L posterior frontal SCWM, L precentral gyrus, L postcentral gyrus infarcts. Small vessel disease. Atrophy.  MRA  negative   Carotid  Doppler  B ICA 40-59% stenosis, VAs antegrade   2D Echo  EF 60%. No source of embolus   LE dopplers negative  TEE unremarkable  Ioop recorder placed   LDL 140  HgbA1c 5.3  TSH ok  Ambulatory, SCDs not on. No need for VTE prophylaxis  No antithrombotic prior to admission, now on aspirin 81 mg daily and clopidogrel 75 mg daily. Continue DAPT x 3 weeks at discharge then ASPIRIN alone.  Therapy recommendations:  No therapy needs  Disposition:  Plan return home Follow-up Stroke Clinic at [facility] in 4 weeks. Office will call with appointment date and time. Order placed.  Carotid stenosis  B/l carotid 40-59% stenosis on CUS  Did not do CTA neck given elevated Cre  Unlikely to be the source of this time b/l infarcts  Recommend to follow up with VVS for monitoring  Hypertension  Elevated 150--200s . Ok to resume home meds and start to lower BP . Long-term BP goal normotensive  Hyperlipidemia  Home meds:  No statin  Now on lipitor 80  LDL 140, goal < 70  Continue statin at discharge  Other Stroke Risk Factors  Hx DVT 2017 tx w/ Eliquis x 6 mos  Other Active Problems  CKD stage III Cr 2.04  Hospital day # 1  Burnetta Sabin, MSN, APRN, ANVP-BC, AGPCNP-BC Advanced Practice Stroke Nurse [place] for Schedule & Pager information 12/14/2017 9:17 AM   ATTENDING NOTE: I reviewed above note and agree with the assessment and plan. I have made any additions or clarifications directly to the above note. Pt was seen and examined.  67 year old male with history of hypertension, CKD, right DVT in 2017 with 6 months of Eliquis treatment admitted for right hand numbness for 30 minutes with speech difficulty. Now resolved. MRI showed left MCA/PCA, MCA/ACA, and bilateral MCA punctate infarcts. MRA negative. Carotid Doppler b/l carotid 40-59% stenosis, EF 60%.A1c 5.3, and LDL 140.Creatinine 2.4. UDS pending.LE venous  Doppler negative for DVT this time.  Patient stroke concerning for cardio embolic pattern. Patient denies any heart palpitation or racing heart. TEE unremarkable and loop recorder placed. Although b/l ICA 40-59% stenosis on CUS, this can not explain the b/l involvement at the same time.  Will need VVS follow up for monitoring. Continue DAPT for 3 weeks and then aspirin alone. Continue statin for stroke prevention. PT/OT no follow up needed.   Neurology will sign off. Please call with questions. Pt will follow up with stroke clinic NP at [facility] in about 4 weeks. Thanks for the consult.  Rosalin Hawking, MD PhD Stroke Neurology 12/14/2017 5:54 PM    To contact Stroke Continuity provider, please refer to [email or web address] After hours, contact General Neurology

## 2017-12-14 NOTE — Progress Notes (Signed)
Patient discharged to Home. All discharge instructions given and understood. Patients son also requested a copy so he could help.

## 2017-12-14 NOTE — CV Procedure (Signed)
Procedure: TEE  Indication: CVA  Sedation: Versed 5 mg IV, Fentanyl 50 mcg IV  Findings: Please see echo section for full report.  Normal LV size with moderate LV hypertrophy.  EF 55-60%, basal inferior and inferolateral akinesis.  Normal RV size and systolic function.  Normal right and left atrial sizes.  No LA appendage thrombus.  No evidence for ASD/PFO, negative bubble study.  No significant tricuspid regurgitation.  Trivial pulmonic insufficiency.  Trileaflet aortic valve with no stenosis or regurgitation.  Trivial mitral regurgitation.  Normal caliber thoracic aorta with mild plaque.    No source of embolus noted.   Billy [name] 12/14/2017 2:43 PM

## 2017-12-14 NOTE — Progress Notes (Signed)
Patient's echo and TEE noted WMA.  Patient is without any kind cardiac symptoms, though does have known PVD.  In d/w Dr. Rayann Heman, outpatient cardiac evaluation may be a good idea for the patient with cardiology service.  I discussed this with the patient, he is appreciative and in agreement.  Out patient cardiology consultation arrangements are in place.  Tommye Standard, PA-C

## 2017-12-15 ENCOUNTER — Encounter (HOSPITAL_COMMUNITY): Payer: Self-pay | Admitting: Internal Medicine

## 2017-12-21 ENCOUNTER — Telehealth: Payer: Self-pay | Admitting: Neurology

## 2017-12-21 NOTE — Telephone Encounter (Signed)
I have received blood work results from the primary care physician done on 19 December 2017.  Glucose 102, creatinine of 2.16, estimated GFR of 31, sodium 139, potassium 4.2, BUN of 33, chloride of 104, CO2 is 25, calcium 9.7.  White blood count of 7.8, hemoglobin of 14.3, hematocrit of 42.3, MCV of 84.2, platelets of 212, TSH 1.52.

## 2017-12-25 ENCOUNTER — Ambulatory Visit (INDEPENDENT_AMBULATORY_CARE_PROVIDER_SITE_OTHER): Payer: BLUE CROSS/BLUE SHIELD | Admitting: Neurology

## 2017-12-25 ENCOUNTER — Encounter: Payer: Self-pay | Admitting: Neurology

## 2017-12-25 ENCOUNTER — Ambulatory Visit: Payer: BLUE CROSS/BLUE SHIELD | Admitting: Neurology

## 2017-12-25 DIAGNOSIS — R202 Paresthesia of skin: Secondary | ICD-10-CM | POA: Diagnosis not present

## 2017-12-25 DIAGNOSIS — G603 Idiopathic progressive neuropathy: Secondary | ICD-10-CM | POA: Diagnosis not present

## 2017-12-25 DIAGNOSIS — G629 Polyneuropathy, unspecified: Secondary | ICD-10-CM

## 2017-12-25 HISTORY — DX: Polyneuropathy, unspecified: G62.9

## 2017-12-25 NOTE — Progress Notes (Addendum)
Please refer to EMG and nerve conduction study procedure note.     [place]    Nerve / Sites Muscle Latency Ref. Amplitude Ref. Rel Amp Segments Distance Velocity Ref. Area    ms ms mV mV %  cm m/s m/s mVms  L Peroneal - EDB     Ankle EDB 6.1 ?6.5 0.8 ?2.0 100 Ankle - EDB 9   1.7     Fib head EDB 14.7  0.6  81.2 Fib head - Ankle 33 38 ?44 1.7     Pop fossa EDB 17.3  0.6  90.5 Pop fossa - Fib head 10 38 ?44 1.6         Pop fossa - Ankle      R Peroneal - EDB     Ankle EDB 6.2 ?6.5 2.1 ?2.0 100 Ankle - EDB 9   5.3     Fib head EDB 14.9  2.1  99.5 Fib head - Ankle 33 38 ?44 5.0     Pop fossa EDB 17.4  1.9  90.9 Pop fossa - Fib head 10 39 ?44 4.6         Pop fossa - Ankle      L Tibial - AH     Ankle AH 5.8 ?5.8 0.8 ?4.0 100 Ankle - AH 9   3.1     Pop fossa AH 18.6  0.3  41.6 Pop fossa - Ankle 43 34 ?41 1.8  R Tibial - AH     Ankle AH 5.1 ?5.8 2.6 ?4.0 100 Ankle - AH 9   7.3     Pop fossa AH 17.0  1.7  65.5 Pop fossa - Ankle 43 36 ?41 3.8             SNC    Nerve / Sites Rec. Site Peak Lat Ref.  Amp Ref. Segments Distance    ms ms V V  cm  R Sural - Ankle (Calf)     Calf Ankle 4.7 ?4.4 2 ?6 Calf - Ankle 14  L Sural - Ankle (Calf)     Calf Ankle 4.9 ?4.4 2 ?6 Calf - Ankle 14  L Superficial peroneal - Ankle     Lat leg Ankle 4.8 ?4.4 1 ?6 Lat leg - Ankle 14  R Superficial peroneal - Ankle     Lat leg Ankle 4.9 ?4.4 2 ?6 Lat leg - Ankle 14              F  Wave    Nerve F Lat Ref.   ms ms  L Tibial - AH 61.9 ?56.0  R Tibial - AH 63.8 ?56.0

## 2017-12-25 NOTE — Progress Notes (Signed)
Please refer to EMG and nerve conduction study procedure note. 

## 2017-12-25 NOTE — Procedures (Signed)
     HISTORY:  Billy [name] is a 67 year old gentleman with a history of numbness involving the lower extremities that has some discomfort that is worse at night.  The patient has had prior lumbosacral spine surgery, the patient is being evaluated for a possible peripheral neuropathy or a lumbosacral radiculopathy.  NERVE CONDUCTION STUDIES:  Nerve conduction studies were performed on both lower extremities.  The distal motor latencies for the peroneal and posterior tibial nerves were within normal limits with low motor amplitudes seen for these nerves bilaterally.  Slowing was seen for the peroneal and posterior tibial nerves bilaterally.  Prolongation of the sural and peroneal sensory latencies were noted bilaterally.  The F-wave latencies for the posterior tibial nerves were prolonged bilaterally.  EMG STUDIES:  EMG study was performed on the left lower extremity:  The tibialis anterior muscle reveals 2 to 5K motor units with decreased recruitment. No fibrillations or positive waves were seen. The peroneus tertius muscle reveals 2 to 6K motor units with decreased recruitment. No fibrillations or positive waves were seen. The medial gastrocnemius muscle reveals 1 to 3K motor units with decreased recruitment. No fibrillations or positive waves were seen. The vastus lateralis muscle reveals 2 to 5K motor units with decreased recruitment. No fibrillations or positive waves were seen. The iliopsoas muscle reveals 2 to 4K motor units with slightly deccreased recruitment. No fibrillations or positive waves were seen. The biceps femoris muscle (long head) reveals 2 to 4K motor units with full recruitment. No fibrillations or positive waves were seen. The lumbosacral paraspinal muscles were tested at 3 levels, and revealed no abnormalities of insertional activity at all 3 levels tested. There was good relaxation.   IMPRESSION:  Nerve conduction studies done on both lower extremities shows evidence  of a primarily axonal peripheral neuropathy of moderate severity.  EMG evaluation of the the left lower extremity shows chronic stable signs of distal denervation consistent with the diagnosis of peripheral neuropathy.  There may be some overlying evidence of a chronic stable L3 or L4 radiculopathy as well, clinical correlation is required.  Jill Alexanders MD 12/25/2017 1:56 PM  [facility] [address] [place], [place] [address]  Phone [phone] Fax [phone]

## 2017-12-28 ENCOUNTER — Ambulatory Visit: Payer: BLUE CROSS/BLUE SHIELD

## 2018-01-16 ENCOUNTER — Ambulatory Visit (INDEPENDENT_AMBULATORY_CARE_PROVIDER_SITE_OTHER): Payer: BLUE CROSS/BLUE SHIELD | Admitting: *Deleted

## 2018-01-16 DIAGNOSIS — I6340 Cerebral infarction due to embolism of unspecified cerebral artery: Secondary | ICD-10-CM | POA: Diagnosis not present

## 2018-01-17 ENCOUNTER — Encounter: Payer: Self-pay | Admitting: Cardiovascular Disease

## 2018-01-17 ENCOUNTER — Ambulatory Visit: Payer: BLUE CROSS/BLUE SHIELD | Admitting: Cardiovascular Disease

## 2018-01-17 DIAGNOSIS — I6523 Occlusion and stenosis of bilateral carotid arteries: Secondary | ICD-10-CM

## 2018-01-17 DIAGNOSIS — E7800 Pure hypercholesterolemia, unspecified: Secondary | ICD-10-CM

## 2018-01-17 DIAGNOSIS — I779 Disorder of arteries and arterioles, unspecified: Secondary | ICD-10-CM | POA: Insufficient documentation

## 2018-01-17 DIAGNOSIS — I10 Essential (primary) hypertension: Secondary | ICD-10-CM | POA: Diagnosis not present

## 2018-01-17 DIAGNOSIS — E785 Hyperlipidemia, unspecified: Secondary | ICD-10-CM | POA: Insufficient documentation

## 2018-01-17 DIAGNOSIS — I739 Peripheral vascular disease, unspecified: Secondary | ICD-10-CM

## 2018-01-17 LAB — CUP PACEART REMOTE DEVICE CHECK
Implantable Pulse Generator Implant Date: 20190829
MDC IDC SESS DTM: 20191001201030

## 2018-01-17 NOTE — Patient Instructions (Signed)
Medication Instructions:  Your physician recommends that you continue on your current medications as directed. Please refer to the Current Medication list given to you today.   Labwork: none  Testing/Procedures: none  Follow-Up: We request that you follow-up in: 6 months with an extender and in 12 months with Dr Berry  You will receive a reminder letter in the mail two months in advance. If you don't receive a letter, please call our office to schedule the follow-up appointment.    Any Other Special Instructions Will Be Listed Below (If Applicable).     If you need a refill on your cardiac medications before your next appointment, please call your pharmacy.   

## 2018-01-17 NOTE — Assessment & Plan Note (Signed)
Moderate bilateral ICA stenosis with Dopplers performed 12/12/2017 that showed stenosis in the 40 to 60% range.  This should be followed on an annual basis.

## 2018-01-17 NOTE — Progress Notes (Signed)
01/17/2018 [name]   29-Jun-1950  [id number]  Primary Physician Damaris Hippo, MD (Inactive) Primary Cardiologist: Lorretta Harp MD Renae Gloss  HPI:  Billy [name] is a 67 y.o. thin appearing divorced Caucasian male father to, grandfather of one grandchild who is retired from [employer] and [employer] and was referred by Dr. Aundra Dubin to be established in my practice for ongoing cardiovascular care.  He lives with his elderly mother and takes care of her.  He has a history of treated hypertension hyperlipidemia.  He is never smoked.  He is never had a heart attack.  There is no family history.  He denies chest pain or shortness of breath.  He has had an echo that shows normal LV function with inferior akinesia.  He did have a TIA/stroke and had a TEE for embolic source which was negative and had a Linq device implanted by Dr. Rayann Heman to rule out PAF as a etiology.   Current Meds  Medication Sig  . ASPIRIN 81 PO TAKE 1 TABLET BY MOUTH EVERY DAY  . atorvastatin (LIPITOR) 80 MG tablet Take 1 tablet (80 mg total) by mouth daily at 6 PM.  . carvedilol (COREG) 25 MG tablet Take 25 mg by mouth 2 (two) times daily with a meal.  . gabapentin (NEURONTIN) 100 MG capsule Take 2 capsules (200 mg total) by mouth at bedtime.  . hydrALAZINE (APRESOLINE) 100 MG tablet Take 100 mg by mouth 3 (three) times daily.   Marland Kitchen spironolactone (ALDACTONE) 25 MG tablet Take 0.5 tablets (12.5 mg total) by mouth daily.     Allergies  Allergen Reactions  . Morphine And Related Other (See Comments)    "Can not handle it"    Social History   Socioeconomic History  . Marital status: Single    Spouse name: Not on file  . Number of children: 2  . Years of education: 60  . Highest education level: Not on file  Occupational History  . 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  Social Needs  . Financial resource strain: Not on file    . Food insecurity:    Worry: Not on file    Inability: Not on file  . Transportation needs:    Medical: Not on file    Non-medical: Not on file  Tobacco Use  . Smoking status: Never Smoker  . Smokeless tobacco: Never Used  Substance and Sexual Activity  . Alcohol use: No  . Drug use: No  . Sexual activity: Not on file  Lifestyle  . Physical activity:    Days per week: Not on file    Minutes per session: Not on file  . Stress: Not on file  Relationships  . Social connections:    Talks on phone: Not on file    Gets together: Not on file    Attends religious service: Not on file    Active member of club or organization: Not on file    Attends meetings of clubs or organizations: Not on file    Relationship status: Not on file  . Intimate partner violence:    Fear of current or ex partner: Not on file    Emotionally abused: Not on file    Physically abused: Not on file    Forced sexual activity: Not on file  Other Topics Concern  . Not on file  Social History Narrative  Raised in [facility] [place]   Caffeine use: coke sometimes   Lives with mother and cares for her currently.     Review of Systems: General: negative for chills, fever, night sweats or weight changes.  Cardiovascular: negative for chest pain, dyspnea on exertion, edema, orthopnea, palpitations, paroxysmal nocturnal dyspnea or shortness of breath Dermatological: negative for rash Respiratory: negative for cough or wheezing Urologic: negative for hematuria Abdominal: negative for nausea, vomiting, diarrhea, bright red blood per rectum, melena, or hematemesis Neurologic: negative for visual changes, syncope, or dizziness All other systems reviewed and are otherwise negative except as noted above.    Blood pressure 122/72, pulse 63, height 6\' 2"  (1.88 m), weight 157 lb 3.2 oz (71.3 kg).  General appearance: alert and no distress Neck: no adenopathy, no JVD, supple, symmetrical, trachea midline, thyroid not  enlarged, symmetric, no tenderness/mass/nodules and Soft bilateral carotid bruits left louder than right Lungs: clear to auscultation bilaterally Heart: regular rate and rhythm, S1, S2 normal, no murmur, click, rub or gallop Extremities: extremities normal, atraumatic, no cyanosis or edema Pulses: 2+ and symmetric Skin: Skin color, texture, turgor normal. No rashes or lesions Neurologic: Alert and oriented X 3, normal strength and tone. Normal symmetric reflexes. Normal coordination and gait  EKG not performed today  ASSESSMENT AND PLAN:   Hypertension History of essential hypertension on carvedilol, hydralazine and spironolactone with blood pressure measured today at 122/72.  He did have a abdominal aortogram using CO2 by Dr. Trula Slade 08/22/2017 that did not visualize the left renal artery and showed a widely patent right renal artery ruling out renal artery stenosis.  Cerebral embolism with cerebral infarction History of TIA with a negative TEE for embolic source status post Linq implantation by Dr. Rayann Heman.  Carotid artery disease (HCC) Moderate bilateral ICA stenosis with Dopplers performed 12/12/2017 that showed stenosis in the 40 to 60% range.  This should be followed on an annual basis.  Hyperlipidemia She of hyperlipidemia on high-dose statin therapy with LDL is in the 140 range.      Lorretta Harp MD FACP,FACC,FAHA, [facility] 01/17/2018 11:38 AM

## 2018-01-17 NOTE — Assessment & Plan Note (Signed)
She of hyperlipidemia on high-dose statin therapy with LDL is in the 140 range.

## 2018-01-17 NOTE — Assessment & Plan Note (Signed)
History of TIA with a negative TEE for embolic source status post Linq implantation by Dr. Rayann Heman.

## 2018-01-17 NOTE — Assessment & Plan Note (Addendum)
History of essential hypertension on carvedilol, hydralazine and spironolactone with blood pressure measured today at 122/72.  He did have a abdominal aortogram using CO2 by Dr. Trula Slade 08/22/2017 that did not visualize the left renal artery and showed a widely patent right renal artery ruling out renal artery stenosis.

## 2018-01-17 NOTE — Progress Notes (Signed)
Carelink Summary Report / Loop Recorder 

## 2018-01-18 DIAGNOSIS — I6523 Occlusion and stenosis of bilateral carotid arteries: Secondary | ICD-10-CM | POA: Diagnosis not present

## 2018-01-18 DIAGNOSIS — N183 Chronic kidney disease, stage 3 (moderate): Secondary | ICD-10-CM | POA: Diagnosis not present

## 2018-01-18 DIAGNOSIS — I5189 Other ill-defined heart diseases: Secondary | ICD-10-CM | POA: Diagnosis not present

## 2018-01-18 DIAGNOSIS — G6289 Other specified polyneuropathies: Secondary | ICD-10-CM | POA: Diagnosis not present

## 2018-01-18 DIAGNOSIS — N280 Ischemia and infarction of kidney: Secondary | ICD-10-CM | POA: Diagnosis not present

## 2018-01-18 DIAGNOSIS — I63412 Cerebral infarction due to embolism of left middle cerebral artery: Secondary | ICD-10-CM | POA: Diagnosis not present

## 2018-01-18 DIAGNOSIS — I10 Essential (primary) hypertension: Secondary | ICD-10-CM | POA: Diagnosis not present

## 2018-01-18 DIAGNOSIS — E7800 Pure hypercholesterolemia, unspecified: Secondary | ICD-10-CM | POA: Diagnosis not present

## 2018-02-06 ENCOUNTER — Other Ambulatory Visit: Payer: Self-pay | Admitting: Internal Medicine

## 2018-02-19 ENCOUNTER — Ambulatory Visit (INDEPENDENT_AMBULATORY_CARE_PROVIDER_SITE_OTHER): Payer: Self-pay | Admitting: *Deleted

## 2018-02-19 ENCOUNTER — Encounter: Payer: BLUE CROSS/BLUE SHIELD | Admitting: Surgery

## 2018-02-19 DIAGNOSIS — I6340 Cerebral infarction due to embolism of unspecified cerebral artery: Secondary | ICD-10-CM

## 2018-02-19 NOTE — Progress Notes (Signed)
Carelink Summary Report / Loop Recorder 

## 2018-02-23 DIAGNOSIS — N2581 Secondary hyperparathyroidism of renal origin: Secondary | ICD-10-CM | POA: Diagnosis not present

## 2018-02-23 DIAGNOSIS — N189 Chronic kidney disease, unspecified: Secondary | ICD-10-CM | POA: Diagnosis not present

## 2018-02-23 DIAGNOSIS — I701 Atherosclerosis of renal artery: Secondary | ICD-10-CM | POA: Diagnosis not present

## 2018-02-23 DIAGNOSIS — N183 Chronic kidney disease, stage 3 (moderate): Secondary | ICD-10-CM | POA: Diagnosis not present

## 2018-02-23 DIAGNOSIS — D631 Anemia in chronic kidney disease: Secondary | ICD-10-CM | POA: Diagnosis not present

## 2018-02-23 DIAGNOSIS — G459 Transient cerebral ischemic attack, unspecified: Secondary | ICD-10-CM | POA: Diagnosis not present

## 2018-02-23 DIAGNOSIS — R4689 Other symptoms and signs involving appearance and behavior: Secondary | ICD-10-CM | POA: Diagnosis not present

## 2018-02-23 DIAGNOSIS — R634 Abnormal weight loss: Secondary | ICD-10-CM | POA: Diagnosis not present

## 2018-02-23 DIAGNOSIS — I129 Hypertensive chronic kidney disease with stage 1 through stage 4 chronic kidney disease, or unspecified chronic kidney disease: Secondary | ICD-10-CM | POA: Diagnosis not present

## 2018-02-28 ENCOUNTER — Telehealth: Payer: Self-pay | Admitting: *Deleted

## 2018-02-28 DIAGNOSIS — I480 Paroxysmal atrial fibrillation: Secondary | ICD-10-CM

## 2018-02-28 NOTE — Telephone Encounter (Signed)
LVMOM requesting call back to the [facility]. New AFib noted on LINQ 02/26/2018 at 9:17. Reviewed episode with Dr. Rayann Heman, recommends to STOP Aspirin, START Eliquis 5mg  BID, Follow-up with AFib Clinic. [facility] phone number to call back.

## 2018-03-01 NOTE — Telephone Encounter (Signed)
LVMOM requesting call back to the [facility]. [facility] phone number.

## 2018-03-02 MED ORDER — APIXABAN 5 MG PO TABS: 5.0000 mg | ORAL_TABLET | Freq: Two times a day (BID) | ORAL | 10 refills | Status: DC

## 2018-03-02 NOTE — Telephone Encounter (Signed)
Pt was returning Lexi phone call.

## 2018-03-02 NOTE — Telephone Encounter (Signed)
Spoke with patient regarding New AFib noted on LINQ 02/26/18, duration 4 hours 26 seconds. Advised patient of Dr. Jackalyn Lombard recommendations to STOP ASA, START Eliquis 5 mg BID, and follow-up with AFib clinic. Order placed. Follow-up with AFib clinic scheduled 03/29/18 at 9:00. Advised patient Eliquis samples and 30 day card will be placed at the front office. Gave patient address and phone number to [facility] and [facility]. Patient verbalized understanding.

## 2018-03-05 ENCOUNTER — Other Ambulatory Visit: Payer: Self-pay | Admitting: Internal Medicine

## 2018-03-05 NOTE — Telephone Encounter (Signed)
This is Dr. Kennon Holter pt. This medication expired and was removed from pt's medication list. Does Dr. Gwenlyn Found want this pt to still take plavix? Please address

## 2018-03-18 DIAGNOSIS — I219 Acute myocardial infarction, unspecified: Secondary | ICD-10-CM

## 2018-03-18 HISTORY — DX: Acute myocardial infarction, unspecified: I21.9

## 2018-03-19 NOTE — Telephone Encounter (Signed)
Spoke with pt, he reports he is no longer taking plavix.

## 2018-03-21 ENCOUNTER — Other Ambulatory Visit (HOSPITAL_COMMUNITY): Payer: Self-pay

## 2018-03-22 ENCOUNTER — Ambulatory Visit (HOSPITAL_COMMUNITY)
Admission: RE | Admit: 2018-03-22 | Discharge: 2018-03-22 | Disposition: A | Discharge status: Home / Self Care | Payer: PPO | Source: Ambulatory Visit | Attending: Nephrology | Admitting: Nephrology

## 2018-03-23 ENCOUNTER — Ambulatory Visit (INDEPENDENT_AMBULATORY_CARE_PROVIDER_SITE_OTHER): Payer: PPO

## 2018-03-23 DIAGNOSIS — I6340 Cerebral infarction due to embolism of unspecified cerebral artery: Secondary | ICD-10-CM

## 2018-03-26 DIAGNOSIS — K922 Gastrointestinal hemorrhage, unspecified: Secondary | ICD-10-CM | POA: Diagnosis not present

## 2018-03-26 DIAGNOSIS — I129 Hypertensive chronic kidney disease with stage 1 through stage 4 chronic kidney disease, or unspecified chronic kidney disease: Secondary | ICD-10-CM | POA: Diagnosis not present

## 2018-03-26 DIAGNOSIS — I4891 Unspecified atrial fibrillation: Secondary | ICD-10-CM | POA: Diagnosis not present

## 2018-03-26 DIAGNOSIS — N189 Chronic kidney disease, unspecified: Secondary | ICD-10-CM | POA: Diagnosis not present

## 2018-03-26 DIAGNOSIS — D631 Anemia in chronic kidney disease: Secondary | ICD-10-CM | POA: Diagnosis not present

## 2018-03-26 DIAGNOSIS — I701 Atherosclerosis of renal artery: Secondary | ICD-10-CM | POA: Diagnosis not present

## 2018-03-26 DIAGNOSIS — N183 Chronic kidney disease, stage 3 (moderate): Secondary | ICD-10-CM | POA: Diagnosis not present

## 2018-03-26 DIAGNOSIS — G459 Transient cerebral ischemic attack, unspecified: Secondary | ICD-10-CM | POA: Diagnosis not present

## 2018-03-26 NOTE — Progress Notes (Signed)
Carelink Summary Report / Loop Recorder 

## 2018-03-27 ENCOUNTER — Other Ambulatory Visit: Payer: Self-pay | Admitting: Internal Medicine

## 2018-03-29 ENCOUNTER — Ambulatory Visit (HOSPITAL_COMMUNITY): Payer: Self-pay | Admitting: Nurse Practitioner

## 2018-03-29 ENCOUNTER — Encounter (HOSPITAL_COMMUNITY): Payer: Self-pay

## 2018-04-03 DIAGNOSIS — M62830 Muscle spasm of back: Secondary | ICD-10-CM | POA: Diagnosis not present

## 2018-04-03 DIAGNOSIS — I10 Essential (primary) hypertension: Secondary | ICD-10-CM | POA: Diagnosis not present

## 2018-04-04 ENCOUNTER — Ambulatory Visit (HOSPITAL_COMMUNITY): Payer: Self-pay | Admitting: Nurse Practitioner

## 2018-04-11 ENCOUNTER — Encounter (HOSPITAL_COMMUNITY): Payer: Self-pay | Admitting: General Practice

## 2018-04-11 ENCOUNTER — Inpatient Hospital Stay (HOSPITAL_COMMUNITY)
Admission: EM | Admit: 2018-04-11 | Discharge: 2018-04-16 | DRG: 377 | Disposition: A | Discharge status: Home / Self Care | Payer: PPO | Attending: Internal Medicine | Admitting: Internal Medicine

## 2018-04-11 ENCOUNTER — Emergency Department (HOSPITAL_COMMUNITY): Payer: PPO

## 2018-04-11 ENCOUNTER — Other Ambulatory Visit: Payer: Self-pay

## 2018-04-11 DIAGNOSIS — I472 Ventricular tachycardia: Secondary | ICD-10-CM | POA: Diagnosis present

## 2018-04-11 DIAGNOSIS — G629 Polyneuropathy, unspecified: Secondary | ICD-10-CM | POA: Diagnosis not present

## 2018-04-11 DIAGNOSIS — Z7901 Long term (current) use of anticoagulants: Secondary | ICD-10-CM

## 2018-04-11 DIAGNOSIS — K264 Chronic or unspecified duodenal ulcer with hemorrhage: Principal | ICD-10-CM | POA: Diagnosis present

## 2018-04-11 DIAGNOSIS — R778 Other specified abnormalities of plasma proteins: Secondary | ICD-10-CM

## 2018-04-11 DIAGNOSIS — R42 Dizziness and giddiness: Secondary | ICD-10-CM | POA: Diagnosis not present

## 2018-04-11 DIAGNOSIS — I129 Hypertensive chronic kidney disease with stage 1 through stage 4 chronic kidney disease, or unspecified chronic kidney disease: Secondary | ICD-10-CM | POA: Diagnosis not present

## 2018-04-11 DIAGNOSIS — K269 Duodenal ulcer, unspecified as acute or chronic, without hemorrhage or perforation: Secondary | ICD-10-CM | POA: Diagnosis not present

## 2018-04-11 DIAGNOSIS — N184 Chronic kidney disease, stage 4 (severe): Secondary | ICD-10-CM | POA: Diagnosis not present

## 2018-04-11 DIAGNOSIS — I480 Paroxysmal atrial fibrillation: Secondary | ICD-10-CM | POA: Diagnosis present

## 2018-04-11 DIAGNOSIS — R578 Other shock: Secondary | ICD-10-CM | POA: Diagnosis present

## 2018-04-11 DIAGNOSIS — N1832 Chronic kidney disease, stage 3b: Secondary | ICD-10-CM | POA: Diagnosis present

## 2018-04-11 DIAGNOSIS — R7989 Other specified abnormal findings of blood chemistry: Secondary | ICD-10-CM | POA: Diagnosis not present

## 2018-04-11 DIAGNOSIS — R58 Hemorrhage, not elsewhere classified: Secondary | ICD-10-CM | POA: Diagnosis not present

## 2018-04-11 DIAGNOSIS — I130 Hypertensive heart and chronic kidney disease with heart failure and stage 1 through stage 4 chronic kidney disease, or unspecified chronic kidney disease: Secondary | ICD-10-CM | POA: Diagnosis present

## 2018-04-11 DIAGNOSIS — N189 Chronic kidney disease, unspecified: Secondary | ICD-10-CM | POA: Diagnosis not present

## 2018-04-11 DIAGNOSIS — I440 Atrioventricular block, first degree: Secondary | ICD-10-CM | POA: Diagnosis not present

## 2018-04-11 DIAGNOSIS — I340 Nonrheumatic mitral (valve) insufficiency: Secondary | ICD-10-CM | POA: Diagnosis not present

## 2018-04-11 DIAGNOSIS — I420 Dilated cardiomyopathy: Secondary | ICD-10-CM | POA: Diagnosis not present

## 2018-04-11 DIAGNOSIS — K2980 Duodenitis without bleeding: Secondary | ICD-10-CM | POA: Diagnosis present

## 2018-04-11 DIAGNOSIS — I10 Essential (primary) hypertension: Secondary | ICD-10-CM | POA: Diagnosis present

## 2018-04-11 DIAGNOSIS — K921 Melena: Secondary | ICD-10-CM | POA: Diagnosis not present

## 2018-04-11 DIAGNOSIS — Z8711 Personal history of peptic ulcer disease: Secondary | ICD-10-CM

## 2018-04-11 DIAGNOSIS — K922 Gastrointestinal hemorrhage, unspecified: Secondary | ICD-10-CM | POA: Diagnosis not present

## 2018-04-11 DIAGNOSIS — Z885 Allergy status to narcotic agent status: Secondary | ICD-10-CM | POA: Diagnosis not present

## 2018-04-11 DIAGNOSIS — I248 Other forms of acute ischemic heart disease: Secondary | ICD-10-CM

## 2018-04-11 DIAGNOSIS — Z79899 Other long term (current) drug therapy: Secondary | ICD-10-CM | POA: Diagnosis not present

## 2018-04-11 DIAGNOSIS — I5042 Chronic combined systolic (congestive) and diastolic (congestive) heart failure: Secondary | ICD-10-CM | POA: Diagnosis present

## 2018-04-11 DIAGNOSIS — N179 Acute kidney failure, unspecified: Secondary | ICD-10-CM | POA: Diagnosis not present

## 2018-04-11 DIAGNOSIS — D62 Acute posthemorrhagic anemia: Secondary | ICD-10-CM | POA: Diagnosis present

## 2018-04-11 DIAGNOSIS — D649 Anemia, unspecified: Secondary | ICD-10-CM | POA: Diagnosis not present

## 2018-04-11 DIAGNOSIS — N183 Chronic kidney disease, stage 3 unspecified: Secondary | ICD-10-CM | POA: Diagnosis present

## 2018-04-11 DIAGNOSIS — E872 Acidosis, unspecified: Secondary | ICD-10-CM | POA: Diagnosis present

## 2018-04-11 DIAGNOSIS — I959 Hypotension, unspecified: Secondary | ICD-10-CM | POA: Diagnosis not present

## 2018-04-11 DIAGNOSIS — E860 Dehydration: Secondary | ICD-10-CM | POA: Diagnosis not present

## 2018-04-11 DIAGNOSIS — I214 Non-ST elevation (NSTEMI) myocardial infarction: Secondary | ICD-10-CM | POA: Diagnosis present

## 2018-04-11 DIAGNOSIS — Z86718 Personal history of other venous thrombosis and embolism: Secondary | ICD-10-CM

## 2018-04-11 DIAGNOSIS — R52 Pain, unspecified: Secondary | ICD-10-CM | POA: Diagnosis not present

## 2018-04-11 DIAGNOSIS — I4430 Unspecified atrioventricular block: Secondary | ICD-10-CM | POA: Diagnosis not present

## 2018-04-11 DIAGNOSIS — R931 Abnormal findings on diagnostic imaging of heart and coronary circulation: Secondary | ICD-10-CM | POA: Diagnosis not present

## 2018-04-11 HISTORY — DX: Anemia, unspecified: D64.9

## 2018-04-11 HISTORY — DX: Adverse effect of unspecified anesthetic, initial encounter: T41.45XA

## 2018-04-11 HISTORY — DX: Other complications of anesthesia, initial encounter: T88.59XA

## 2018-04-11 HISTORY — DX: Pure hypercholesterolemia, unspecified: E78.00

## 2018-04-11 HISTORY — DX: Personal history of other medical treatment: Z92.89

## 2018-04-11 HISTORY — DX: Transient cerebral ischemic attack, unspecified: G45.9

## 2018-04-11 LAB — I-STAT CG4 LACTIC ACID, ED: LACTIC ACID, VENOUS: 3.15 mmol/L — AB (ref 0.5–1.9)

## 2018-04-11 LAB — COMPREHENSIVE METABOLIC PANEL
ALBUMIN: 2.6 g/dL — AB (ref 3.5–5.0)
ALT: 48 U/L — AB (ref 0–44)
AST: 81 U/L — AB (ref 15–41)
Alkaline Phosphatase: 63 U/L (ref 38–126)
Anion gap: 13 (ref 5–15)
BUN: 58 mg/dL — AB (ref 8–23)
CHLORIDE: 115 mmol/L — AB (ref 98–111)
CO2: 14 mmol/L — AB (ref 22–32)
CREATININE: 2.94 mg/dL — AB (ref 0.61–1.24)
Calcium: 7.8 mg/dL — ABNORMAL LOW (ref 8.9–10.3)
GFR calc Af Amer: 25 mL/min — ABNORMAL LOW (ref >60)
GFR, EST NON AFRICAN AMERICAN: 21 mL/min — AB (ref >60)
Glucose, Bld: 118 mg/dL — ABNORMAL HIGH (ref 70–99)
Potassium: 4 mmol/L (ref 3.5–5.1)
SODIUM: 142 mmol/L (ref 135–145)
Total Bilirubin: 0.3 mg/dL (ref 0.3–1.2)
Total Protein: 4.5 g/dL — ABNORMAL LOW (ref 6.5–8.1)

## 2018-04-11 LAB — TROPONIN I
TROPONIN I: 1.57 ng/mL — AB (ref <0.03)
TROPONIN I: 17.23 ng/mL — AB (ref <0.03)
Troponin I: 8.55 ng/mL (ref <0.03)

## 2018-04-11 LAB — HEMOGLOBIN AND HEMATOCRIT, BLOOD
HCT: 25.8 % — ABNORMAL LOW (ref 39.0–52.0)
HCT: 29.8 % — ABNORMAL LOW (ref 39.0–52.0)
Hemoglobin: 7.9 g/dL — ABNORMAL LOW (ref 13.0–17.0)
Hemoglobin: 9.3 g/dL — ABNORMAL LOW (ref 13.0–17.0)

## 2018-04-11 LAB — BASIC METABOLIC PANEL
Anion gap: 10 (ref 5–15)
BUN: 53 mg/dL — ABNORMAL HIGH (ref 8–23)
CO2: 17 mmol/L — AB (ref 22–32)
Calcium: 8.2 mg/dL — ABNORMAL LOW (ref 8.9–10.3)
Chloride: 116 mmol/L — ABNORMAL HIGH (ref 98–111)
Creatinine, Ser: 2.68 mg/dL — ABNORMAL HIGH (ref 0.61–1.24)
GFR calc Af Amer: 28 mL/min — ABNORMAL LOW (ref >60)
GFR calc non Af Amer: 24 mL/min — ABNORMAL LOW (ref >60)
Glucose, Bld: 105 mg/dL — ABNORMAL HIGH (ref 70–99)
Potassium: 3.5 mmol/L (ref 3.5–5.1)
Sodium: 143 mmol/L (ref 135–145)

## 2018-04-11 LAB — CBC WITH DIFFERENTIAL/PLATELET
ABS IMMATURE GRANULOCYTES: 0.12 10*3/uL — AB (ref 0.00–0.07)
BASOS PCT: 0 %
Basophils Absolute: 0 10*3/uL (ref 0.0–0.1)
Eosinophils Absolute: 0.1 10*3/uL (ref 0.0–0.5)
Eosinophils Relative: 1 %
HCT: 17.6 % — ABNORMAL LOW (ref 39.0–52.0)
Hemoglobin: 5 g/dL — CL (ref 13.0–17.0)
IMMATURE GRANULOCYTES: 2 %
Lymphocytes Relative: 14 %
Lymphs Abs: 1.1 10*3/uL (ref 0.7–4.0)
MCH: 25 pg — ABNORMAL LOW (ref 26.0–34.0)
MCHC: 28.4 g/dL — ABNORMAL LOW (ref 30.0–36.0)
MCV: 88 fL (ref 80.0–100.0)
MONOS PCT: 11 %
Monocytes Absolute: 0.9 10*3/uL (ref 0.1–1.0)
NEUTROS ABS: 5.7 10*3/uL (ref 1.7–7.7)
NEUTROS PCT: 72 %
PLATELETS: 153 10*3/uL (ref 150–400)
RBC: 2 MIL/uL — AB (ref 4.22–5.81)
RDW: 17.6 % — ABNORMAL HIGH (ref 11.5–15.5)
WBC: 7.9 10*3/uL (ref 4.0–10.5)
nRBC: 0.3 % — ABNORMAL HIGH (ref 0.0–0.2)

## 2018-04-11 LAB — POTASSIUM: Potassium: 4.2 mmol/L (ref 3.5–5.1)

## 2018-04-11 LAB — I-STAT TROPONIN, ED: TROPONIN I, POC: 1.97 ng/mL — AB (ref 0.00–0.08)

## 2018-04-11 LAB — PREPARE RBC (CROSSMATCH)

## 2018-04-11 LAB — LACTIC ACID, PLASMA: Lactic Acid, Venous: 1 mmol/L (ref 0.5–1.9)

## 2018-04-11 LAB — POC OCCULT BLOOD, ED: Fecal Occult Bld: POSITIVE — AB

## 2018-04-11 LAB — LIPASE, BLOOD: LIPASE: 48 U/L (ref 11–51)

## 2018-04-11 LAB — MAGNESIUM: Magnesium: 2.4 mg/dL (ref 1.7–2.4)

## 2018-04-11 LAB — ABO/RH: ABO/RH(D): A POS

## 2018-04-11 MED ORDER — ACETAMINOPHEN 325 MG PO TABS
650.0000 mg | ORAL_TABLET | Freq: Four times a day (QID) | ORAL | Status: DC | PRN
  Administered 2018-04-12 – 2018-04-16 (×7): 650 mg via ORAL
  Filled 2018-04-11 (×7): qty 2

## 2018-04-11 MED ORDER — SODIUM CHLORIDE 0.9 % IV BOLUS
1000.0000 mL | Freq: Once | INTRAVENOUS | Status: AC
  Administered 2018-04-11: 1000 mL via INTRAVENOUS

## 2018-04-11 MED ORDER — SODIUM CHLORIDE 0.9 % IV SOLN
10.0000 mL/h | Freq: Once | INTRAVENOUS | Status: AC
  Administered 2018-04-11: 10 mL/h via INTRAVENOUS

## 2018-04-11 MED ORDER — STERILE WATER FOR INJECTION IV SOLN
INTRAVENOUS | Status: DC
  Administered 2018-04-11: 14:00:00 via INTRAVENOUS
  Filled 2018-04-11 (×2): qty 850

## 2018-04-11 MED ORDER — ONDANSETRON HCL 4 MG/2ML IJ SOLN: 4.0000 mg | Freq: Four times a day (QID) | INTRAMUSCULAR | Status: DC | PRN

## 2018-04-11 MED ORDER — SODIUM CHLORIDE 0.9 % IV SOLN
80.0000 mg | Freq: Once | INTRAVENOUS | Status: AC
  Administered 2018-04-11: 80 mg via INTRAVENOUS
  Filled 2018-04-11: qty 80

## 2018-04-11 MED ORDER — PANTOPRAZOLE SODIUM 40 MG IV SOLR
40.0000 mg | Freq: Two times a day (BID) | INTRAVENOUS | Status: DC
  Administered 2018-04-14 – 2018-04-15 (×2): 40 mg via INTRAVENOUS
  Filled 2018-04-11 (×3): qty 40

## 2018-04-11 MED ORDER — SODIUM CHLORIDE 0.9% IV SOLUTION
Freq: Once | INTRAVENOUS | Status: AC
  Administered 2018-04-11: 16:00:00 via INTRAVENOUS

## 2018-04-11 MED ORDER — CARVEDILOL 3.125 MG PO TABS
3.1250 mg | ORAL_TABLET | Freq: Two times a day (BID) | ORAL | Status: DC
  Administered 2018-04-11 – 2018-04-12 (×2): 3.125 mg via ORAL
  Filled 2018-04-11 (×2): qty 1

## 2018-04-11 MED ORDER — ONDANSETRON HCL 4 MG PO TABS: 4.0000 mg | ORAL_TABLET | Freq: Four times a day (QID) | ORAL | Status: DC | PRN

## 2018-04-11 MED ORDER — SODIUM CHLORIDE 0.9 % IV SOLN
8.0000 mg/h | INTRAVENOUS | Status: AC
  Administered 2018-04-11 – 2018-04-14 (×5): 8 mg/h via INTRAVENOUS
  Filled 2018-04-11 (×11): qty 80

## 2018-04-11 MED ORDER — ACETAMINOPHEN 650 MG RE SUPP: 650.0000 mg | Freq: Four times a day (QID) | RECTAL | Status: DC | PRN

## 2018-04-11 MED ORDER — GABAPENTIN 100 MG PO CAPS
200.0000 mg | ORAL_CAPSULE | Freq: Every day | ORAL | Status: DC
  Administered 2018-04-11 – 2018-04-15 (×5): 200 mg via ORAL
  Filled 2018-04-11 (×6): qty 2

## 2018-04-11 NOTE — Progress Notes (Signed)
Patient receiving 2nd unit of PRBC. No issues. VSS. Will continue to monitor.
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
## 2018-04-11 NOTE — Progress Notes (Signed)
Second unit of PRBC transfused. Orders in for H/H. Patient tolerated well. No complaints at the moment. Will continue to monitor.

## 2018-04-11 NOTE — Progress Notes (Signed)
Patient had 13 beat run of Vtach.Pt stable, no complaints. VSS. MD Karleen Hampshire updated. Ordered potassium and magnesium levels to be checked. Will continue to monitor.

## 2018-04-11 NOTE — ED Notes (Signed)
Admitting MD Gardner at bedside. 
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
## 2018-04-11 NOTE — Progress Notes (Signed)
Billy [name] is a 67 y.o. male with medical history significant of A.Fib recently started on eliquis last month, CKD stage 3, HTN.  Patient presents to the ED with c/o weakness, dyspnea.  He was found to have severe symptomatic anemia with a hemoglobin of 5.  He received 3 units of prbc transfusion and repeat H&h every 8 hours.  Plan: 1. GI/ cardiology consulted.  2. Cardiac cath on Friday.   Hosie Poisson, MD [phone]

## 2018-04-11 NOTE — ED Notes (Signed)
XR at bedside

## 2018-04-11 NOTE — Progress Notes (Signed)
3rd unit of PRBC transfused. No issues. Will continue to monitor.
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
## 2018-04-11 NOTE — ED Triage Notes (Signed)
Pt presents to ED via EMS from home. 2000 last night pt had black, tarry bowel movement. 2100 pt began feeling "out of it," hallucinating. Has been falling more frequently recently. Did not fall tonight but pt was found by mother lying on floor. No LOC. Found to be hypotensive by EMS, 80s/40s. 500 NS bolus given PTA. Denies cp. Pt's mom states she thinks he took too much bp medication. Pt had orthostatic changes with EMS.

## 2018-04-12 ENCOUNTER — Inpatient Hospital Stay (HOSPITAL_COMMUNITY): Payer: PPO

## 2018-04-12 DIAGNOSIS — I472 Ventricular tachycardia: Secondary | ICD-10-CM

## 2018-04-12 DIAGNOSIS — I340 Nonrheumatic mitral (valve) insufficiency: Secondary | ICD-10-CM

## 2018-04-12 DIAGNOSIS — R931 Abnormal findings on diagnostic imaging of heart and coronary circulation: Secondary | ICD-10-CM

## 2018-04-12 DIAGNOSIS — I214 Non-ST elevation (NSTEMI) myocardial infarction: Secondary | ICD-10-CM

## 2018-04-12 LAB — TYPE AND SCREEN
ABO/RH(D): A POS
Antibody Screen: NEGATIVE
UNIT DIVISION: 0
Unit division: 0
Unit division: 0

## 2018-04-12 LAB — BASIC METABOLIC PANEL
Anion gap: 8 (ref 5–15)
BUN: 40 mg/dL — ABNORMAL HIGH (ref 8–23)
CO2: 17 mmol/L — ABNORMAL LOW (ref 22–32)
Calcium: 8.3 mg/dL — ABNORMAL LOW (ref 8.9–10.3)
Chloride: 118 mmol/L — ABNORMAL HIGH (ref 98–111)
Creatinine, Ser: 2.34 mg/dL — ABNORMAL HIGH (ref 0.61–1.24)
GFR calc Af Amer: 33 mL/min — ABNORMAL LOW (ref >60)
GFR calc non Af Amer: 28 mL/min — ABNORMAL LOW (ref >60)
Glucose, Bld: 86 mg/dL (ref 70–99)
POTASSIUM: 3.8 mmol/L (ref 3.5–5.1)
Sodium: 143 mmol/L (ref 135–145)

## 2018-04-12 LAB — ECHOCARDIOGRAM COMPLETE
Height: 74 in
Weight: 2631.41 oz

## 2018-04-12 LAB — BPAM RBC
Blood Product Expiration Date: 202001152359
Blood Product Expiration Date: 202001152359
Blood Product Expiration Date: 202001152359
ISSUE DATE / TIME: 201912250441
ISSUE DATE / TIME: 201912250750
ISSUE DATE / TIME: 201912251512
Unit Type and Rh: 6200
Unit Type and Rh: 6200
Unit Type and Rh: 6200

## 2018-04-12 LAB — HEMOGLOBIN AND HEMATOCRIT, BLOOD
HCT: 27.3 % — ABNORMAL LOW (ref 39.0–52.0)
HCT: 29.7 % — ABNORMAL LOW (ref 39.0–52.0)
Hemoglobin: 8.5 g/dL — ABNORMAL LOW (ref 13.0–17.0)
Hemoglobin: 9.3 g/dL — ABNORMAL LOW (ref 13.0–17.0)

## 2018-04-12 SURGERY — ESOPHAGOGASTRODUODENOSCOPY (EGD) WITH PROPOFOL
Anesthesia: Monitor Anesthesia Care

## 2018-04-12 MED ORDER — CARVEDILOL 6.25 MG PO TABS: 6.2500 mg | ORAL_TABLET | Freq: Two times a day (BID) | ORAL | Status: DC

## 2018-04-12 MED ORDER — AMLODIPINE BESYLATE 5 MG PO TABS
5.0000 mg | ORAL_TABLET | Freq: Every day | ORAL | Status: DC
  Filled 2018-04-12: qty 1

## 2018-04-12 MED ORDER — CARVEDILOL 12.5 MG PO TABS
12.5000 mg | ORAL_TABLET | Freq: Two times a day (BID) | ORAL | Status: DC
  Administered 2018-04-12: 12.5 mg via ORAL
  Filled 2018-04-12: qty 1

## 2018-04-12 MED ORDER — HYDRALAZINE HCL 20 MG/ML IJ SOLN: 10.0000 mg | INTRAMUSCULAR | Status: DC | PRN

## 2018-04-12 NOTE — Progress Notes (Addendum)
Progress Note  Patient Name: Billy [name] Date of Encounter: 04/12/2018  Primary Cardiologist: Quay Burow, MD   Subjective   No chest pain or dyspnea.   Inpatient Medications    Scheduled Meds: . carvedilol  3.125 mg Oral BID WC  . gabapentin  200 mg Oral QHS  . [START ON 04/14/2018] pantoprazole  40 mg Intravenous Q12H   Continuous Infusions: . pantoprozole (PROTONIX) infusion 8 mg/hr (04/12/18 0139)   PRN Meds: acetaminophen **OR** acetaminophen, ondansetron **OR** ondansetron (ZOFRAN) IV   Vital Signs    Vitals:   04/11/18 1542 04/11/18 1805 04/11/18 1923 04/12/18 0443  BP: (!) 152/93 (!) 153/86 (!) 162/98 (!) 143/68  Pulse: 67 69 65 63  Resp: (!) 21 20 17  (!) 22  Temp: 97.6 F (36.4 C) 97.9 F (36.6 C) 98 F (36.7 C) (!) 97.4 F (36.3 C)  TempSrc: Oral Oral Oral Oral  SpO2: 98% 99% 100% 96%  Weight:      Height:        Intake/Output Summary (Last 24 hours) at 04/12/2018 0754 Last data filed at 04/12/2018 0603 Gross per 24 hour  Intake 660.87 ml  Output 600 ml  Net 60.87 ml   Filed Weights   04/11/18 0257 04/11/18 0534  Weight: 74.8 kg 74.6 kg    Telemetry    SR with multiple small bursts of NSVT - Personally Reviewed  ECG    N/A  Physical Exam   GEN: No acute distress.   Neck: No JVD Cardiac: RRR, no murmurs, rubs, or gallops.  Respiratory: Clear to auscultation bilaterally. GI: Soft, nontender, non-distended  MS: No edema; No deformity. Neuro:  Nonfocal  Psych: Normal affect   Labs    Chemistry Recent Labs  Lab 04/11/18 0314 04/11/18 1226 04/11/18 1833  NA 142 143  --   K 4.0 3.5 4.2  CL 115* 116*  --   CO2 14* 17*  --   GLUCOSE 118* 105*  --   BUN 58* 53*  --   CREATININE 2.94* 2.68*  --   CALCIUM 7.8* 8.2*  --   PROT 4.5*  --   --   ALBUMIN 2.6*  --   --   AST 81*  --   --   ALT 48*  --   --   ALKPHOS 63  --   --   BILITOT 0.3  --   --   GFRNONAA 21* 24*  --   GFRAA 25* 28*  --   ANIONGAP 13 10  --       Hematology Recent Labs  Lab 04/11/18 0314 04/11/18 1226 04/11/18 1833 04/12/18 0201  WBC 7.9  --   --   --   RBC 2.00*  --   --   --   HGB 5.0* 7.9* 9.3* 8.5*  HCT 17.6* 25.8* 29.8* 27.3*  MCV 88.0  --   --   --   MCH 25.0*  --   --   --   MCHC 28.4*  --   --   --   RDW 17.6*  --   --   --   PLT 153  --   --   --     Cardiac Enzymes Recent Labs  Lab 04/11/18 0415 04/11/18 1226 04/11/18 1833  TROPONINI 1.57* 8.55* 17.23*    Recent Labs  Lab 04/11/18 0313  TROPIPOC 1.97*     Radiology    Dg Chest Port 1 View  Result Date: 04/11/2018 CLINICAL DATA:  Feeling unwell, hallucinations, hypotensive. History of hypertension. EXAM: PORTABLE CHEST 1 VIEW COMPARISON:  CT chest June 29, 2017 FINDINGS: Cardiac silhouette is mildly enlarged. Mediastinal silhouette is not suspicious. Loop monitor projects LEFT chest. No pleural effusion or focal consolidation. No pneumothorax. Soft tissue planes and included osseous structures are non suspicious. IMPRESSION: Mild cardiomegaly.  No acute pulmonary process. Electronically Signed   By: Elon Alas M.D.   On: 04/11/2018 04:19    Cardiac Studies   2D TTE 04/12/2018: EF 45-50% with GRII DD.  Basal to mid inferior akinesis with severe hypokinesis of the mid-basal inferoseptal wall, moderate hypokinesis of the basal inferolateral and apical inferior walls.  EF has worsened compared to previous EKG with more prominent inferior and inferoseptal wall motion normalities noted.   Patient Profile     Billy [name] is a 67 y.o. 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  Assessment & Plan    1. NSTEMI in setting of acute blood loss - Troponin gradually peaked at 17.23. 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 CT scan of his chest from March that demonstrates at least LAD and RCA calcifications. - Continue BB. Statin on hold due to elevated LFTS.  With still abnormal renal function and no clear evidence of stable anemia, cardiac catheterization is not currently an option until we know the source of bleeding and his renal function is stable.  2. Paroxysmal atrial fibrillation -Maintaining sinus rhythm with 3 short episode this admission at controlled rate. Off Eliquis due to GI bleed.  3. NSVT - 3 episodes. K 4.2 & Mg 2.4 today. Continue coreg 6.176m BID - increase to 12.5 mg BID  4. Acute GI bleed with anemia - Likely EGD tomorrow once cleared. Hgb of 8.5  5.  Essential HTH --home antihypertensives have been on hold with exception of beta-blocker.    I am increasing the carvedilol up to 12.5 mg twice daily (anticipate going 25 mg twice daily tomorrow).    Have written for IV hydralazine as needed SBP greater than 160/90.    We will also add amlodipine 5 mg daily.  (Preferable to hydralazine 100 mg 3 times daily)    Signed, Leanor Kail, PA  04/12/2018, 7:54 AM   . I have seen, examined and evaluated the patient this PM along with Mr. Geannie Risen.  After reviewing all the available data and chart, we discussed the patients laboratory, study & physical findings as well as symptoms in detail. I agree with his findings, examination as well as impression recommendations as per our discussion.    This is a classic difficult situation where the patient comes in having started on anticoagulation is found to be anemic with significant symptomatic anemia requiring multiple units of blood transfusion.  In this setting he then has positive troponins that are quite significant suggesting significant bout of myocardial infarction that goes along with an abnormal echocardiogram  finding and intermittent VT.  Patient not having any chest pain or heart failure symptoms. Part of the presentation was acute renal insufficiency slightly improved.    This brings up the classic diagnostic dilemma.  Indeed with a new wall motion normality and elevated troponin, cardiac catheterization would be warranted with exception of the fact that creatinine is still greater than 2 and anemia is still being treated.  With potential active GI bleed source, cardiac catheterization is not recommended as we would not be able to perform any intervention until bleeding source is seen and treated..  Diagnostic catheterization could be an option, however this would not change the concern from the GI standpoint as far as potential adverse cardiac outcomes.  EGD and colonoscopy are both low risk procedures from a cardiac standpoint with minimal risk for cardiac outcomes. Preferred course of action would be actually to allow for resolution of renal function and stabilization of hemoglobin.  At that point we can determine timing of GI versus cardiac evaluation. From our interventional cardiology standpoint, it is preferable to be L to perform diagnostic catheterization with the ability to do ad hoc PCI which we would not be on given the situation.  Adding anticoagulation for PCI with antiplatelet agents post PCI would not be acceptable in the setting of active GI bleeding.  I discussed with interventional colleagues available here today, and all are in agreement that we would not want to perform cardiac catheterization tomorrow unless the patient becomes unstable.  I will notify GI medicine  I will increase carvedilol dose given the NSVT for now - he takes 25 mg BID @ home -- will increase to 12.5 mg BID now (& anticipate 25 mg BID tomorrow). .  Will reevaluate renal function and hemoglobin tomorrow.   I have discussed with Dr. [name], he would be in favor of proceeding with EGD, but the biggest concern is  anesthesia risk. Unfortunately, risk stratification is difficult.  He is a HIGH RISK PATIENT  high risk with an wall motion reality, positive troponin and abnormal EKG as above as best we can do.  He is on beta-blocker which is cardioprotective.  If he were to have an adverse event periprocedural, we would be ready to perform emergent catheterization, however still leaves [place] in the difficult situation of anticoagulation.  EGD with sedation is a LOW RISK PROCEDURE. --At this point further cardiac evaluation would be catheterization and therefore not an option.  At this point I think the best course of action would be to be cautious and perform EGD to hopefully find a source of bleed.  This will then allow [place] to monitor him over the weekend to ensure hemoglobin stable to improved and resolving renal failure.  Dr. [name] will try to arrange for this to be performed tomorrow.  Best case scenario would be that we could potentially consider catheterization with possible PCI early next week, but would need to discuss with interventional [place] prior to scheduling.      Glenetta Hew, M.D., M.S. Interventional Cardiologist   Pager # [phone] Phone # [phone] [address] [place],  [address]     For questions or updates, please contact [place] Please consult [email or web address] for contact info under

## 2018-04-12 NOTE — Progress Notes (Signed)
PROGRESS NOTE    Billy [name]  ULA:[id number] DOB: [birth year] DOA: 04/11/2018 PCP: System, Pcp Not In    Brief Narrative:  Billy [name] is a 67 y.o. male with medical history significant of A.Fib recently started on eliquis last month, CKD stage 3, HTN , h/o DVT, peripheral neuropathy, presents with severe symptomatic anemia and NSTEMI.  Assessment & Plan:   Principal Problem:   Gastrointestinal hemorrhage with melena Active Problems:   Kidney disease, chronic, stage III (GFR 30-59 ml/min) (HCC)   Hypertension   AKI (acute kidney injury) ([place])   Acute blood loss anemia   Metabolic acidosis, normal anion gap (NAG)   Acute blood loss anemia possible upper GI / malena: - s/p 3 units of prbc transfusion and check H&h every 8 hours and keep hemoglobin greater than 9.  - GI consulted and plan for EGD in the next 24 to 48 hours.    NSVT: Asymptomatic and on coreg.    NSTEMI;  ? Symptomatic anemia vs CAD Cardiology on board and plan for cath when GI source of bleeding has been found.    AKI on stage 3 CKD: From hypoperfusion from severe anemia and dehydration. Baseline creatinine appears to be around 2.  Today its 2.34. get UA, urine creatinine and urine sodium .  Check US renal.    Peripheral neuropathy:  - On gabapentin. Resume the same.    DVT prophylaxis: SCD'S Code Status: FULL CODE.  Family Communication: None at bedside.  Disposition Plan: pending further eval.   Consultants:   Cardiology Dr Ellyn Hack  Gastroenterology Dr [name].   Procedures: Echocardiogram.   Antimicrobials: None.   Subjective: No chest pain or sob.   Objective: Vitals:   04/11/18 1805 04/11/18 1923 04/12/18 0443 04/12/18 0823  BP: (!) 153/86 (!) 162/98 (!) 143/68 (!) 158/90  Pulse: 69 65 63 68  Resp: 20 17 (!) 22   Temp: 97.9 F (36.6 C) 98 F (36.7 C) (!) 97.4 F (36.3 C)   TempSrc: Oral Oral Oral   SpO2: 99% 100% 96%   Weight:      Height:        Intake/Output Summary  (Last 24 hours) at 04/12/2018 1212 Last data filed at 04/12/2018 0603 Gross per 24 hour  Intake 660.87 ml  Output 600 ml  Net 60.87 ml   Filed Weights   04/11/18 0257 04/11/18 0534  Weight: 74.8 kg 74.6 kg    Examination:  General exam: Appears calm and comfortable , no distress.  Respiratory system: Clear to auscultation. Respiratory effort normal. Cardiovascular system: S1 & S2 heard, RRR. No JVD, murmurs, rubs, gallops or clicks. No pedal edema. Gastrointestinal system: Abdomen is nondistended, soft and nontender. Normal bowel sounds heard. Central nervous system: Alert and oriented. No focal neurological deficits. Extremities: Symmetric 5 x 5 power. Skin: No rashes, lesions or ulcers Psychiatry: . Mood & affect appropriate.     Data Reviewed: I have personally reviewed following labs and imaging studies  CBC: Recent Labs  Lab 04/11/18 0314 04/11/18 1226 04/11/18 1833 04/12/18 0201 04/12/18 0910  WBC 7.9  --   --   --   --   NEUTROABS 5.7  --   --   --   --   HGB 5.0* 7.9* 9.3* 8.5* 9.3*  HCT 17.6* 25.8* 29.8* 27.3* 29.7*  MCV 88.0  --   --   --   --   PLT 153  --   --   --   --  Basic Metabolic Panel: Recent Labs  Lab 04/11/18 0314 04/11/18 1226 04/11/18 1833 04/12/18 1029  NA 142 143  --  143  K 4.0 3.5 4.2 3.8  CL 115* 116*  --  118*  CO2 14* 17*  --  17*  GLUCOSE 118* 105*  --  86  BUN 58* 53*  --  40*  CREATININE 2.94* 2.68*  --  2.34*  CALCIUM 7.8* 8.2*  --  8.3*  MG  --   --  2.4  --    GFR: Estimated Creatinine Clearance: 33.2 mL/min (A) (by C-G formula based on SCr of 2.34 mg/dL (H)). Liver Function Tests: Recent Labs  Lab 04/11/18 0314  AST 81*  ALT 48*  ALKPHOS 63  BILITOT 0.3  PROT 4.5*  ALBUMIN 2.6*   Recent Labs  Lab 04/11/18 0314  LIPASE 48   No results for input(s): AMMONIA in the last 168 hours. Coagulation Profile: No results for input(s): INR, PROTIME in the last 168 hours. Cardiac Enzymes: Recent Labs  Lab  04/11/18 0415 04/11/18 1226 04/11/18 1833  TROPONINI 1.57* 8.55* 17.23*   BNP (last 3 results) No results for input(s): PROBNP in the last 8760 hours. HbA1C: No results for input(s): HGBA1C in the last 72 hours. CBG: No results for input(s): GLUCAP in the last 168 hours. Lipid Profile: No results for input(s): CHOL, HDL, LDLCALC, TRIG, CHOLHDL, LDLDIRECT in the last 72 hours. Thyroid Function Tests: No results for input(s): TSH, T4TOTAL, FREET4, T3FREE, THYROIDAB in the last 72 hours. Anemia Panel: No results for input(s): VITAMINB12, FOLATE, FERRITIN, TIBC, IRON, RETICCTPCT in the last 72 hours. Sepsis Labs: Recent Labs  Lab 04/11/18 0316 04/11/18 1226  LATICACIDVEN 3.15* 1.0    No results found for this or any previous visit (from the past 240 hour(s)).       Radiology Studies: Dg Chest Port 1 View  Result Date: 04/11/2018 CLINICAL DATA:  Feeling unwell, hallucinations, hypotensive. History of hypertension. EXAM: PORTABLE CHEST 1 VIEW COMPARISON:  CT chest June 29, 2017 FINDINGS: Cardiac silhouette is mildly enlarged. Mediastinal silhouette is not suspicious. Loop monitor projects LEFT chest. No pleural effusion or focal consolidation. No pneumothorax. Soft tissue planes and included osseous structures are non suspicious. IMPRESSION: Mild cardiomegaly.  No acute pulmonary process. Electronically Signed   By: Elon Alas M.D.   On: 04/11/2018 04:19        Scheduled Meds: . carvedilol  3.125 mg Oral BID WC  . gabapentin  200 mg Oral QHS  . [START ON 04/14/2018] pantoprazole  40 mg Intravenous Q12H   Continuous Infusions: . pantoprozole (PROTONIX) infusion 8 mg/hr (04/12/18 0700)     LOS: 1 day    Time spent: 36 minutes.    Hosie Poisson, MD [facility] Pager [phone]   If 7PM-7AM, please contact night-coverage [email or web address] Password [name] 04/12/2018, 12:12 PM

## 2018-04-12 NOTE — Progress Notes (Signed)
Subjective: Less dark stool. No abdominal pain.  Objective: Vital signs in last 24 hours: Temp:  [97.4 F (36.3 C)-98 F (36.7 C)] 97.4 F (36.3 C) (12/26 0443) Pulse Rate:  [63-84] 68 (12/26 0823) Resp:  [17-22] 22 (12/26 0443) BP: (143-162)/(68-98) 158/90 (12/26 0823) SpO2:  [96 %-100 %] 96 % (12/26 0443) Weight change:  Last BM Date: 04/11/18  PE: GEN:  NAD  Lab Results: CBC    Component Value Date/Time   WBC 7.9 04/11/2018 0314   RBC 2.00 (L) 04/11/2018 0314   HGB 9.3 (L) 04/12/2018 0910   HCT 29.7 (L) 04/12/2018 0910   PLT 153 04/11/2018 0314   MCV 88.0 04/11/2018 0314   MCH 25.0 (L) 04/11/2018 0314   MCHC 28.4 (L) 04/11/2018 0314   RDW 17.6 (H) 04/11/2018 0314   LYMPHSABS 1.1 04/11/2018 0314   MONOABS 0.9 04/11/2018 0314   EOSABS 0.1 04/11/2018 0314   BASOSABS 0.0 04/11/2018 0314   CMP     Component Value Date/Time   NA 143 04/12/2018 1029   NA 144 12/18/2015 0904   K 3.8 04/12/2018 1029   CL 118 (H) 04/12/2018 1029   CO2 17 (L) 04/12/2018 1029   GLUCOSE 86 04/12/2018 1029   BUN 40 (H) 04/12/2018 1029   BUN 33 (H) 12/18/2015 0904   CREATININE 2.34 (H) 04/12/2018 1029   CALCIUM 8.3 (L) 04/12/2018 1029   PROT 4.5 (L) 04/11/2018 0314   PROT 5.9 (L) 11/21/2017 0958   ALBUMIN 2.6 (L) 04/11/2018 0314   ALBUMIN 4.2 12/03/2015 0918   AST 81 (H) 04/11/2018 0314   ALT 48 (H) 04/11/2018 0314   ALKPHOS 63 04/11/2018 0314   BILITOT 0.3 04/11/2018 0314   BILITOT <0.2 12/03/2015 0918   GFRNONAA 28 (L) 04/12/2018 1029   GFRAA 33 (L) 04/12/2018 1029   Assessment:  1.  Melena, improved.   NSAID ulcer? 2.  Chronic anticoagulation:  Apixaban, currently on hold. 3.  NSTEMI. 4.  Multiple medical problems.  Plan:  1.  Full liquid diet, NPO after midnight.  Continue PPI. 2.  Cath per cardiology seems to be planned for tomorrow. 3.  EGD once patient has cardiac clearance, possible Saturday. 4.  [facility] will follow.   Landry Dyke 04/12/2018, 12:39  PM   Cell [phone] If no answer or after 5 PM call [phone]

## 2018-04-12 NOTE — Progress Notes (Signed)
Patient declined amlodipine, stating he thinks it previously caused bilateral LE swelling with rash.

## 2018-04-13 ENCOUNTER — Encounter (HOSPITAL_COMMUNITY)
Admission: EM | Disposition: A | Discharge status: Home / Self Care | Payer: Self-pay | Source: Home / Self Care | Attending: Internal Medicine

## 2018-04-13 ENCOUNTER — Encounter (HOSPITAL_COMMUNITY): Payer: Self-pay | Admitting: *Deleted

## 2018-04-13 ENCOUNTER — Inpatient Hospital Stay (HOSPITAL_COMMUNITY): Payer: PPO | Admitting: Certified Registered Nurse Anesthetist

## 2018-04-13 ENCOUNTER — Inpatient Hospital Stay (HOSPITAL_COMMUNITY): Payer: PPO

## 2018-04-13 HISTORY — PX: ESOPHAGOGASTRODUODENOSCOPY (EGD) WITH PROPOFOL: SHX5813

## 2018-04-13 LAB — BASIC METABOLIC PANEL
Anion gap: 10 (ref 5–15)
BUN: 37 mg/dL — ABNORMAL HIGH (ref 8–23)
CALCIUM: 8.4 mg/dL — AB (ref 8.9–10.3)
CO2: 17 mmol/L — ABNORMAL LOW (ref 22–32)
Chloride: 115 mmol/L — ABNORMAL HIGH (ref 98–111)
Creatinine, Ser: 2.25 mg/dL — ABNORMAL HIGH (ref 0.61–1.24)
GFR calc Af Amer: 34 mL/min — ABNORMAL LOW (ref >60)
GFR calc non Af Amer: 29 mL/min — ABNORMAL LOW (ref >60)
Glucose, Bld: 99 mg/dL (ref 70–99)
Potassium: 3.8 mmol/L (ref 3.5–5.1)
Sodium: 142 mmol/L (ref 135–145)

## 2018-04-13 LAB — CBC
HCT: 28.3 % — ABNORMAL LOW (ref 39.0–52.0)
Hemoglobin: 8.6 g/dL — ABNORMAL LOW (ref 13.0–17.0)
MCH: 26.1 pg (ref 26.0–34.0)
MCHC: 30.4 g/dL (ref 30.0–36.0)
MCV: 86 fL (ref 80.0–100.0)
Platelets: 186 10*3/uL (ref 150–400)
RBC: 3.29 MIL/uL — ABNORMAL LOW (ref 4.22–5.81)
RDW: 16.2 % — ABNORMAL HIGH (ref 11.5–15.5)
WBC: 8.8 10*3/uL (ref 4.0–10.5)
nRBC: 0 % (ref 0.0–0.2)

## 2018-04-13 SURGERY — ESOPHAGOGASTRODUODENOSCOPY (EGD) WITH PROPOFOL
Anesthesia: Monitor Anesthesia Care | Laterality: Left

## 2018-04-13 MED ORDER — CARVEDILOL 25 MG PO TABS
25.0000 mg | ORAL_TABLET | Freq: Two times a day (BID) | ORAL | Status: DC
  Administered 2018-04-13 – 2018-04-16 (×7): 25 mg via ORAL
  Filled 2018-04-13 (×7): qty 1

## 2018-04-13 MED ORDER — SODIUM CHLORIDE 0.9 % IV SOLN
INTRAVENOUS | Status: DC
  Administered 2018-04-13: 09:00:00 via INTRAVENOUS

## 2018-04-13 MED ORDER — SODIUM CHLORIDE 0.9 % IV SOLN
INTRAVENOUS | Status: DC | PRN
  Administered 2018-04-13: 11:00:00 via INTRAVENOUS

## 2018-04-13 MED ORDER — PROPOFOL 10 MG/ML IV BOLUS
INTRAVENOUS | Status: DC | PRN
  Administered 2018-04-13 (×3): 10 mg via INTRAVENOUS

## 2018-04-13 MED ORDER — PROPOFOL 500 MG/50ML IV EMUL
INTRAVENOUS | Status: DC | PRN
  Administered 2018-04-13: 100 ug/kg/min via INTRAVENOUS

## 2018-04-13 MED ORDER — SODIUM CHLORIDE 0.9 % IV SOLN: INTRAVENOUS | Status: DC

## 2018-04-13 MED ORDER — ONDANSETRON HCL 4 MG/2ML IJ SOLN
INTRAMUSCULAR | Status: DC | PRN
  Administered 2018-04-13: 4 mg via INTRAVENOUS

## 2018-04-13 SURGICAL SUPPLY — 15 items

## 2018-04-13 NOTE — Anesthesia Procedure Notes (Signed)
Procedure Name: MAC Date/Time: 04/13/2018 11:35 AM Performed by: Harden Mo, CRNA Pre-anesthesia Checklist: Patient identified, Emergency Drugs available, Suction available and Patient being monitored Patient Re-evaluated:Patient Re-evaluated prior to induction Oxygen Delivery Method: Nasal cannula Preoxygenation: Pre-oxygenation with 100% oxygen Induction Type: IV induction Placement Confirmation: positive ETCO2 and breath sounds checked- equal and bilateral Dental Injury: Teeth and Oropharynx as per pre-operative assessment

## 2018-04-13 NOTE — Progress Notes (Addendum)
PROGRESS NOTE  Billy [name] ZJI:[id number] DOB: 1950-09-20 DOA: 04/11/2018 PCP: System, Pcp Not In  HPI/Recap of past 24 hours: Billy [name] a 67 y.o.malewith medical history significant ofA.Fib recently started on eliquis last month, CKD stage 3, HTN , h/o DVT, peripheral neuropathy, presents with severe symptomatic anemia and NSTEMI.   04/13/2018: Patient seen and examined at bedside.  No acute events overnight.  Reports he had his last bowel movement yesterday.  Denies abdominal pain, or nausea.  Denies chest pain, dyspnea or palpitations.  EGD planned today.  EGD revealed 1 nonbleeding duodenal ulcer with duodenitis.  Assessment/Plan: Principal Problem:   Gastrointestinal hemorrhage with melena Active Problems:   Kidney disease, chronic, stage III (GFR 30-59 ml/min) (HCC)   Hypertension   AKI (acute kidney injury) ([place])   Acute blood loss anemia   Metabolic acidosis, normal anion gap (NAG)  Acute blood loss anemia possible upper GI / malena: - s/p 3 units of prbc transfusion and check H&h every 8 hours and keep hemoglobin greater than 9.  - GI consulted and plan for EGD in the next 24 to 48 hours.  -EGD completed today 04/13/2018 by Dr. [name] revealed 1 nonbleeding duodenal ulcer with duodenitis -Continue PPI drip -Continue to monitor H&H -Hemoglobin 8.6 from 9.3 yesterday  Nonbleeding duodenal ulcer/duodenitis Findings from EGD done today 04/13/2018 by Dr. [name] Continue PPI drip Clear liquid diet as recommended by GI  NSVT: Asymptomatic and on coreg.   NSTEMI;  Symptomatic anemia vs CAD Cardiology on board and plan for cath when GI source of bleeding has been found.   AKI on stage 3 CKD: From hypoperfusion from severe anemia and dehydration. Baseline creatinine appears to be around 2.  Improving Creatinine 2.25 from 2.34 yesterday  Peripheral neuropathy:  - On gabapentin. Resume the same.    DVT prophylaxis: SCD'S Code Status: FULL CODE.  Family  Communication: None at bedside.  Disposition Plan: pending further eval.   Consultants:   Cardiology Dr Ellyn Hack  Gastroenterology Dr [name].   Procedures: Echocardiogram.   Antimicrobials: None.      Objective: Vitals:   04/13/18 1220 04/13/18 1230 04/13/18 1304 04/13/18 1706  BP: (!) 151/70 (!) 157/83 (!) 153/81 (!) 159/86  Pulse: (!) 55 (!) 55 (!) 50 62  Resp: 17 14 14    Temp:   98.2 F (36.8 C)   TempSrc:   Oral   SpO2: 97% 96% 99%   Weight:      Height:        Intake/Output Summary (Last 24 hours) at 04/13/2018 1849 Last data filed at 04/13/2018 1318 Gross per 24 hour  Intake 1438.97 ml  Output 220 ml  Net 1218.97 ml   Filed Weights   04/11/18 0257 04/11/18 0534 04/13/18 0458  Weight: 74.8 kg 74.6 kg 75.4 kg    Exam:  . General: 67 y.o. year-old male well developed well nourished in no acute distress.  Alert and oriented x3. . Cardiovascular: Regular rate and rhythm with no rubs or gallops.  No thyromegaly or JVD noted.   Marland Kitchen Respiratory: Clear to auscultation with no wheezes or rales. Good inspiratory effort. . Abdomen: Soft nontender nondistended with normal bowel sounds x4 quadrants. . Musculoskeletal: No lower extremity edema. 2/4 pulses in all 4 extremities. . Skin: No ulcerative lesions noted or rashes . Psychiatry: Mood is appropriate for condition and setting   Data Reviewed: CBC: Recent Labs  Lab 04/11/18 0314 04/11/18 1226 04/11/18 1833 04/12/18 0201 04/12/18 0910 04/13/18 0421  WBC  7.9  --   --   --   --  8.8  NEUTROABS 5.7  --   --   --   --   --   HGB 5.0* 7.9* 9.3* 8.5* 9.3* 8.6*  HCT 17.6* 25.8* 29.8* 27.3* 29.7* 28.3*  MCV 88.0  --   --   --   --  86.0  PLT 153  --   --   --   --  878   Basic Metabolic Panel: Recent Labs  Lab 04/11/18 0314 04/11/18 1226 04/11/18 1833 04/12/18 1029 04/13/18 0421  NA 142 143  --  143 142  K 4.0 3.5 4.2 3.8 3.8  CL 115* 116*  --  118* 115*  CO2 14* 17*  --  17* 17*  GLUCOSE 118* 105*   --  86 99  BUN 58* 53*  --  40* 37*  CREATININE 2.94* 2.68*  --  2.34* 2.25*  CALCIUM 7.8* 8.2*  --  8.3* 8.4*  MG  --   --  2.4  --   --    GFR: Estimated Creatinine Clearance: 34.9 mL/min (A) (by C-G formula based on SCr of 2.25 mg/dL (H)). Liver Function Tests: Recent Labs  Lab 04/11/18 0314  AST 81*  ALT 48*  ALKPHOS 63  BILITOT 0.3  PROT 4.5*  ALBUMIN 2.6*   Recent Labs  Lab 04/11/18 0314  LIPASE 48   No results for input(s): AMMONIA in the last 168 hours. Coagulation Profile: No results for input(s): INR, PROTIME in the last 168 hours. Cardiac Enzymes: Recent Labs  Lab 04/11/18 0415 04/11/18 1226 04/11/18 1833  TROPONINI 1.57* 8.55* 17.23*   BNP (last 3 results) No results for input(s): PROBNP in the last 8760 hours. HbA1C: No results for input(s): HGBA1C in the last 72 hours. CBG: No results for input(s): GLUCAP in the last 168 hours. Lipid Profile: No results for input(s): CHOL, HDL, LDLCALC, TRIG, CHOLHDL, LDLDIRECT in the last 72 hours. Thyroid Function Tests: No results for input(s): TSH, T4TOTAL, FREET4, T3FREE, THYROIDAB in the last 72 hours. Anemia Panel: No results for input(s): VITAMINB12, FOLATE, FERRITIN, TIBC, IRON, RETICCTPCT in the last 72 hours. Urine analysis:    Component Value Date/Time   COLORURINE YELLOW 03/20/2017 2227   APPEARANCEUR CLEAR 03/20/2017 2227   LABSPEC 1.020 03/20/2017 2227   PHURINE 5.0 03/20/2017 2227   GLUCOSEU NEGATIVE 03/20/2017 2227   HGBUR SMALL (A) 03/20/2017 2227   BILIRUBINUR NEGATIVE 03/20/2017 2227   KETONESUR NEGATIVE 03/20/2017 2227   PROTEINUR 100 (A) 03/20/2017 2227   NITRITE NEGATIVE 03/20/2017 2227   LEUKOCYTESUR NEGATIVE 03/20/2017 2227   Sepsis Labs: @LABRCNTIP (procalcitonin:4,lacticidven:4)  )No results found for this or any previous visit (from the past 240 hour(s)).    Studies: US Renal  Result Date: 04/13/2018 CLINICAL DATA:  Acute onset of renal insufficiency. EXAM: RENAL /  URINARY TRACT ULTRASOUND COMPLETE COMPARISON:  Renal ultrasound performed 07/27/2017 FINDINGS: Right Kidney: Renal measurements: 11.3 x 4.8 x 5.1 cm = volume: 148.7 mL . Echogenicity within normal limits. No mass or hydronephrosis visualized. Left Kidney: Renal measurements: 7.1 x 4.2 x 3.8 cm = volume: 60.5 mL. Echogenicity within normal limits. No mass or hydronephrosis visualized. Bladder: Appears normal for degree of bladder distention. Small bilateral pleural effusions are noted. Mild ascites is noted within the abdomen and pelvis. IMPRESSION: 1. No evidence of hydronephrosis. 2. Left renal atrophy noted. 3. Small bilateral pleural effusions. 4. Mild ascites within the abdomen and pelvis. Electronically Signed  By: Garald Balding M.D.   On: 04/13/2018 05:30    Scheduled Meds: . carvedilol  25 mg Oral BID WC  . gabapentin  200 mg Oral QHS  . [START ON 04/14/2018] pantoprazole  40 mg Intravenous Q12H    Continuous Infusions: . pantoprozole (PROTONIX) infusion 8 mg/hr (04/13/18 1318)     LOS: 2 days     Kayleen Memos, MD [facility] Pager [phone]  If 7PM-7AM, please contact night-coverage [email or web address] Password [facility] 04/13/2018, 6:49 PM

## 2018-04-13 NOTE — Progress Notes (Addendum)
Progress Note  Patient Name: Billy [name] Date of Encounter: 04/13/2018  Primary Cardiologist: Quay Burow, MD   Subjective   Feeling well. No chest pain, sob or palpitations.   Inpatient Medications    Scheduled Meds: . carvedilol  25 mg Oral BID WC  . gabapentin  200 mg Oral QHS  . [START ON 04/14/2018] pantoprazole  40 mg Intravenous Q12H   Continuous Infusions: . sodium chloride    . sodium chloride    . pantoprozole (PROTONIX) infusion 8 mg/hr (04/13/18 0031)   PRN Meds: acetaminophen **OR** acetaminophen, hydrALAZINE, ondansetron **OR** ondansetron (ZOFRAN) IV   Vital Signs    Vitals:   04/12/18 0823 04/12/18 1337 04/12/18 1958 04/13/18 0458  BP: (!) 158/90 (!) 177/89 (!) 176/90 (!) 146/79  Pulse: 68 65 60 (!) 59  Resp:   15 16  Temp:  98.3 F (36.8 C) 98 F (36.7 C) 98.9 F (37.2 C)  TempSrc:  Oral Oral Oral  SpO2:  96% 100% 98%  Weight:    75.4 kg  Height:    6\' 2"  (1.88 m)    Intake/Output Summary (Last 24 hours) at 04/13/2018 0808 Last data filed at 04/13/2018 0612 Gross per 24 hour  Intake 1009.87 ml  Output 920 ml  Net 89.87 ml   Filed Weights   04/11/18 0257 04/11/18 0534 04/13/18 0458  Weight: 74.8 kg 74.6 kg 75.4 kg    Telemetry    SR with PACs and PVCS at 70s- Personally Reviewed  ECG    N/A  Physical Exam   GEN: No acute distress.   Neck: No JVD Cardiac: RRR, no murmurs, rubs, or gallops.  Respiratory: Clear to auscultation bilaterally. GI: Soft, nontender, non-distended  MS: No edema; No deformity. Neuro:  Nonfocal  Psych: Normal affect   Labs    Chemistry Recent Labs  Lab 04/11/18 0314 04/11/18 1226 04/11/18 1833 04/12/18 1029 04/13/18 0421  NA 142 143  --  143 142  K 4.0 3.5 4.2 3.8 3.8  CL 115* 116*  --  118* 115*  CO2 14* 17*  --  17* 17*  GLUCOSE 118* 105*  --  86 99  BUN 58* 53*  --  40* 37*  CREATININE 2.94* 2.68*  --  2.34* 2.25*  CALCIUM 7.8* 8.2*  --  8.3* 8.4*  PROT 4.5*  --   --   --   --     ALBUMIN 2.6*  --   --   --   --   AST 81*  --   --   --   --   ALT 48*  --   --   --   --   ALKPHOS 63  --   --   --   --   BILITOT 0.3  --   --   --   --   GFRNONAA 21* 24*  --  28* 29*  GFRAA 25* 28*  --  33* 34*  ANIONGAP 13 10  --  8 10     Hematology Recent Labs  Lab 04/11/18 0314  04/12/18 0201 04/12/18 0910 04/13/18 0421  WBC 7.9  --   --   --  8.8  RBC 2.00*  --   --   --  3.29*  HGB 5.0*   < > 8.5* 9.3* 8.6*  HCT 17.6*   < > 27.3* 29.7* 28.3*  MCV 88.0  --   --   --  86.0  MCH 25.0*  --   --   --  26.1  MCHC 28.4*  --   --   --  30.4  RDW 17.6*  --   --   --  16.2*  PLT 153  --   --   --  186   < > = values in this interval not displayed.    Cardiac Enzymes Recent Labs  Lab 04/11/18 0415 04/11/18 1226 04/11/18 1833  TROPONINI 1.57* 8.55* 17.23*    Recent Labs  Lab 04/11/18 0313  TROPIPOC 1.97*    Radiology    US Renal  Result Date: 04/13/2018 CLINICAL DATA:  Acute onset of renal insufficiency. EXAM: RENAL / URINARY TRACT ULTRASOUND COMPLETE COMPARISON:  Renal ultrasound performed 07/27/2017 FINDINGS: Right Kidney: Renal measurements: 11.3 x 4.8 x 5.1 cm = volume: 148.7 mL . Echogenicity within normal limits. No mass or hydronephrosis visualized. Left Kidney: Renal measurements: 7.1 x 4.2 x 3.8 cm = volume: 60.5 mL. Echogenicity within normal limits. No mass or hydronephrosis visualized. Bladder: Appears normal for degree of bladder distention. Small bilateral pleural effusions are noted. Mild ascites is noted within the abdomen and pelvis. IMPRESSION: 1. No evidence of hydronephrosis. 2. Left renal atrophy noted. 3. Small bilateral pleural effusions. 4. Mild ascites within the abdomen and pelvis. Electronically Signed   By: Garald Balding M.D.   On: 04/13/2018 05:30    Cardiac Studies   Echo 04/13/18 - Left ventricle: The cavity size was mildly dilated. Wall   thickness was increased in a pattern of moderate LVH. Systolic   function was mildly  reduced. The estimated ejection fraction was   in the range of 45% to 50%. Features are consistent with a   pseudonormal left ventricular filling pattern, with concomitant   abnormal relaxation and increased filling pressure (grade 2   diastolic dysfunction). Doppler parameters are consistent with   high ventricular filling pressure. - Regional wall motion abnormality: Akinesis of the basal-mid   inferior myocardium; severe hypokinesis of the mid inferoseptal   myocardium; moderate hypokinesis of the basal inferoseptal,   apical inferior, mid inferolateral, and apical septal myocardium. - Aortic valve: Sclerosis without stenosis. There was no   regurgitation. Mean gradient (S): 3 mm Hg. - Mitral valve: Transvalvular velocity was within the normal range.   There was no evidence for stenosis. There was mild regurgitation,   posteriorly directed. Mild anterior leaflet override likely   secondary to posterior leaflet tethering. - Left atrium: The atrium was mildly dilated. - Right ventricle: The cavity size was normal. Wall thickness was   normal. Systolic function was normal. - Right atrium: The atrium was mildly dilated. Central venous   pressure (est): 15 mm Hg. - Atrial septum: No defect or patent foramen ovale was identified. - Tricuspid valve: There was trivial regurgitation. - Inferior vena cava: The vessel was dilated. The respirophasic   diameter changes were blunted (< 50%), consistent with elevated   central venous pressure. - Pericardium, extracardiac: A trivial pericardial effusion was   identified.  Impressions:  - Compared to the prior TTE exam of 12/12/2017, inferior and   inferoseptal wall motion abnormalities have worsened, EF has   decreased. Mitral regurgitation has increased. Side by side   comparison of images performed.     Moderate concentric LVH with valvular thickening. Cannot exclude   cardiac amyloidosis.  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 NSTEMI in setting of acute blood loss - Troponin gradually peaked at 17.23. Echo this admission showed worsened LVEF and inferior and   inferoseptal wall motion abnormalities  Compared to TTE 11/2017. See yesterdays note regarding Dr. Ellyn Hack recommends, basically recommends EGD with sedation to r/o acute bleeding.  Chest pain free.  - Continue BB. Statin on hold due to elevated LFTS, will recheck tomorrow.  -Decision about timing for cardiac cath based on what we find on EGD.  (See below and attending attestation),  2. Paroxysmal atrial fibrillation -Maintaining sinus rhythm with 3 short episode this admission at controlled rate. Off Eliquis due to GI bleed. Maintaining sinus rhythm. Continue BB.   3. NSVT - Electrolytes stable. Increase Coreg further to 25mg  BID, improving  4. Acute GI bleed with anemia - Likely EGD today   5.  Essential Hypertension  - Hx of Edema on Norvasc. Increase Coreg further to 25mg  BID  6. Acute on CKD stage III - Scr improving.      SignedCrista Luria [place], PA  04/13/2018, 8:08 AM     ATTENDING ATTESTATION  I have seen, examined and evaluated the patient this PM after EGS (after seen by Billy [name]).  After reviewing all the available data and chart, we discussed the patients laboratory, study & physical findings as well as symptoms in detail. I agree with his findings, examination as well as impression recommendations as per our discussion.    Patient seen after his EGD.  I discussed with Dr. [name] findings there is a duodenal ulcer difficult to reach.  Would not be a good target for cauterization, if there was a rebleed would need to consider embolization.   As such, per discussion with him, he would prefer that we wait for at least 1 month to allow for healing of this lesion prior to considering antiplatelet agents and anticoagulation.  For now the recommendation would be medical management of his CAD and mild cardiomyopathy until his hemoglobin level is stabilized and he has had time for his ulcer to heal.  Beta-blocker dose was increased.  As renal function improves, can add ARB, but for now if additional blood pressure management is required, would use hydralazine/nitrate. Restart statin once LFTs normalize.  Currently off of Eliquis because of the bleed.  Would probably continue to hold for least a couple weeks based on Dr. Erlinda Hong recommendations.  He will need to be seen in relatively close follow-up roughly 2 to 3 weeks after hospitalization in order to reassess for ischemic evaluation.    Glenetta Hew, M.D., M.S. Interventional Cardiologist   Pager # [phone] Phone # [phone] [address] [place], [place] [address]    For questions or updates, please contact [place] Please consult [email or web address] for contact info under

## 2018-04-13 NOTE — Anesthesia Preprocedure Evaluation (Signed)
Anesthesia Evaluation  Patient identified by MRN, date of birth, ID band Patient awake    Reviewed: Allergy & Precautions, NPO status , Patient's Chart, lab work & pertinent test results, reviewed documented beta blocker date and time   History of Anesthesia Complications Negative for: history of anesthetic complications  Airway Mallampati: II  TM Distance: >3 FB     Dental  (+) Missing, Chipped, Dental Advisory Given   Pulmonary neg pulmonary ROS,    breath sounds clear to auscultation       Cardiovascular hypertension, Pt. on medications and Pt. on home beta blockers + Past MI (NSTEMI) and + DVT  + dysrhythmias Atrial Fibrillation  Rhythm:Irregular Rate:Normal  04/10/18 ECHO: EF 45-50%, akinesis of inferior, severe hypokinesis of infero-septal, infero-lateral, apical walls, mild MR   Neuro/Psych TIAnegative psych ROS   GI/Hepatic GI bleed   Endo/Other    Renal/GU Renal InsufficiencyRenal disease (creat 2.25)     Musculoskeletal   Abdominal   Peds  Hematology  (+) Blood dyscrasia (Hb 8.6), anemia , eliquis   Anesthesia Other Findings   Reproductive/Obstetrics                             Anesthesia Physical Anesthesia Plan  ASA: III  Anesthesia Plan: MAC   Post-op Pain Management:    Induction:   PONV Risk Score and Plan: 1 and Treatment may vary due to age or medical condition  Airway Management Planned: Natural Airway and Nasal Cannula  Additional Equipment:   Intra-op Plan:   Post-operative Plan:   Informed Consent: I have reviewed the patients History and Physical, chart, labs and discussed the procedure including the risks, benefits and alternatives for the proposed anesthesia with the patient or authorized representative who has indicated his/her understanding and acceptance.   Dental advisory given  Plan Discussed with: CRNA and Surgeon  Anesthesia Plan Comments:          Anesthesia Quick Evaluation

## 2018-04-13 NOTE — Progress Notes (Signed)
Patient taken to Endoscopy via wc.

## 2018-04-13 NOTE — Op Note (Signed)
[facility] Patient Name: Billy [name] Procedure Date : 04/13/2018 MRN: [id number] Attending MD: Arta Silence , MD Date of Birth: 02/23/1951 CSN: [id number] Age: 67 Admit Type: Inpatient Procedure:                Upper GI endoscopy Indications:              Acute post hemorrhagic anemia, Melena Providers:                Arta Silence, MD, Carlyn Reichert, RN, Charolette Child, Technician, Garrison Columbus, CRNA Referring MD:              Medicines:                Monitored Anesthesia Care Complications:            No immediate complications. Estimated Blood Loss:     Estimated blood loss was minimal. Procedure:                Pre-Anesthesia Assessment:                           - Prior to the procedure, a History and Physical                            was performed, and patient medications and                            allergies were reviewed. The patient's tolerance of                            previous anesthesia was also reviewed. The risks                            and benefits of the procedure and the sedation                            options and risks were discussed with the patient.                            All questions were answered, and informed consent                            was obtained. Prior Anticoagulants: The patient                            last took Eliquis (apixaban) 3 days prior to the                            procedure. ASA Grade Assessment: III - A patient                            with severe systemic disease. After reviewing the  risks and benefits, the patient was deemed in                            satisfactory condition to undergo the procedure.                           After obtaining informed consent, the endoscope was                            passed under direct vision. Throughout the                            procedure, the patient's blood pressure, pulse, and              oxygen saturations were monitored continuously. The                            GIF-H190 ([id number] Olympus adult EGD was introduced                            through the mouth, and advanced to the second part                            of duodenum. The upper GI endoscopy was                            accomplished without difficulty. The patient                            tolerated the procedure well. Scope In: Scope Out: Findings:      The examined esophagus was normal.      The entire examined stomach was normal.      Diffuse moderate inflammation characterized by congestion (edema),       erythema and friability was found in the duodenal bulb. Some luminal       stenosis in distal duodenal bulb, in vicinity of ulcer, through which       the diagnostic endoscope passed without resistance.      One non-bleeding cratered duodenal ulcer was found in the duodenal bulb.       The lesion was 8 mm in largest dimension. Clean-based and deep with no       adherent clot or visible vessel; ulcer is very friable with some oozing       around the margins after passing by the region with the scope.      The exam of the duodenum was otherwise normal. Impression:               - Normal esophagus.                           - Normal stomach.                           - Duodenitis.                           - One non-bleeding duodenal ulcer.  Moderate Sedation:      None Recommendation:           - Return patient to hospital ward for ongoing care.                           - Clear liquid diet today.                           - Continue present medications, including PPI gtt.                           - I discussed case with Dr. Ellyn Hack (Cardiology); I                            feel ulcer is at moderate (30-40%) risk of                            significant bleeding with anticoagulation; and, if                            bleeding were to occur, this would probably require                             IR consultation for angiogram, as the ulcer is in                            very odd location and under lots of edema and not                            readily treatable endoscopically.                           [facility] will follow. Procedure Code(s):        --- Professional ---                           [phone], Esophagogastroduodenoscopy, flexible,                            transoral; diagnostic, including collection of                            specimen(s) by brushing or washing, when performed                            (separate procedure) Diagnosis Code(s):        --- Professional ---                           K29.80, Duodenitis without bleeding                           K26.9, Duodenal ulcer, unspecified as acute or  chronic, without hemorrhage or perforation                           D62, Acute posthemorrhagic anemia                           K92.1, Melena (includes Hematochezia) CPT copyright 2018 American [employer] Association. All rights reserved. The codes documented in this report are preliminary and upon coder review may  be revised to meet current compliance requirements. Arta Silence, MD 04/13/2018 12:10:18 PM This report has been signed electronically. Number of Addenda: 0

## 2018-04-13 NOTE — Transfer of Care (Signed)
Immediate Anesthesia Transfer of Care Note  Patient: Billy [name]  Procedure(s) Performed: ESOPHAGOGASTRODUODENOSCOPY (EGD) WITH PROPOFOL (Left )  Patient Location: Endoscopy Unit  Anesthesia Type:MAC  Level of Consciousness: awake, alert  and oriented  Airway & Oxygen Therapy: Patient Spontanous Breathing and Patient connected to nasal cannula oxygen  Post-op Assessment: Report given to RN, Post -op Vital signs reviewed and stable and Patient moving all extremities X 4  Post vital signs: Reviewed and stable  Last Vitals:  Vitals Value Taken Time  BP    Temp    Pulse    Resp    SpO2      Last Pain:  Vitals:   04/13/18 1023  TempSrc: Oral  PainSc: 0-No pain         Complications: No apparent anesthesia complications

## 2018-04-13 NOTE — Anesthesia Postprocedure Evaluation (Signed)
Anesthesia Post Note  Patient: Billy [name]  Procedure(s) Performed: ESOPHAGOGASTRODUODENOSCOPY (EGD) WITH PROPOFOL (Left )     Patient location during evaluation: Endoscopy Anesthesia Type: MAC Level of consciousness: awake and alert, patient cooperative and oriented Pain management: pain level controlled Vital Signs Assessment: post-procedure vital signs reviewed and stable Respiratory status: spontaneous breathing, nonlabored ventilation and respiratory function stable Cardiovascular status: blood pressure returned to baseline and stable Postop Assessment: no apparent nausea or vomiting Anesthetic complications: no    Last Vitals:  Vitals:   04/13/18 1215 04/13/18 1220  BP:  (!) 151/70  Pulse: (!) 55 (!) 55  Resp: 15 17  Temp:    SpO2: 96% 97%    Last Pain:  Vitals:   04/13/18 1220  TempSrc:   PainSc: 0-No pain                 Teja Judice,E. Kameka Whan

## 2018-04-13 NOTE — Interval H&P Note (Signed)
History and Physical Interval Note:  04/13/2018 11:07 AM  Billy [name]  has presented today for surgery, with the diagnosis of melena, anemia.  The various methods of treatment have been discussed with the patient and family. After consideration of risks, benefits and other options for treatment, the patient has consented to  Procedure(s): ESOPHAGOGASTRODUODENOSCOPY (EGD) WITH PROPOFOL (Left) as a surgical intervention .  The patient's history has been reviewed, patient examined, no change in status, stable for surgery.  I have reviewed the patient's chart and labs.  Questions were answered to the patient's satisfaction.     Landry Dyke

## 2018-04-14 DIAGNOSIS — I420 Dilated cardiomyopathy: Secondary | ICD-10-CM

## 2018-04-14 LAB — COMPREHENSIVE METABOLIC PANEL
ALT: 45 U/L — ABNORMAL HIGH (ref 0–44)
ANION GAP: 8 (ref 5–15)
AST: 33 U/L (ref 15–41)
Albumin: 2.5 g/dL — ABNORMAL LOW (ref 3.5–5.0)
Alkaline Phosphatase: 63 U/L (ref 38–126)
BUN: 30 mg/dL — ABNORMAL HIGH (ref 8–23)
CHLORIDE: 117 mmol/L — AB (ref 98–111)
CO2: 20 mmol/L — ABNORMAL LOW (ref 22–32)
Calcium: 8.2 mg/dL — ABNORMAL LOW (ref 8.9–10.3)
Creatinine, Ser: 2.23 mg/dL — ABNORMAL HIGH (ref 0.61–1.24)
GFR calc Af Amer: 35 mL/min — ABNORMAL LOW (ref >60)
GFR calc non Af Amer: 30 mL/min — ABNORMAL LOW (ref >60)
Glucose, Bld: 112 mg/dL — ABNORMAL HIGH (ref 70–99)
Potassium: 4.3 mmol/L (ref 3.5–5.1)
Sodium: 145 mmol/L (ref 135–145)
Total Bilirubin: 0.2 mg/dL — ABNORMAL LOW (ref 0.3–1.2)
Total Protein: 4.5 g/dL — ABNORMAL LOW (ref 6.5–8.1)

## 2018-04-14 LAB — LIPID PANEL
Cholesterol: 141 mg/dL (ref 0–200)
HDL: 34 mg/dL — ABNORMAL LOW (ref >40)
LDL Cholesterol: 97 mg/dL (ref 0–99)
Total CHOL/HDL Ratio: 4.1 RATIO
Triglycerides: 52 mg/dL (ref <150)
VLDL: 10 mg/dL (ref 0–40)

## 2018-04-14 LAB — CBC
HEMATOCRIT: 27.3 % — AB (ref 39.0–52.0)
HEMOGLOBIN: 8.4 g/dL — AB (ref 13.0–17.0)
MCH: 26.1 pg (ref 26.0–34.0)
MCHC: 30.8 g/dL (ref 30.0–36.0)
MCV: 84.8 fL (ref 80.0–100.0)
Platelets: 173 10*3/uL (ref 150–400)
RBC: 3.22 MIL/uL — ABNORMAL LOW (ref 4.22–5.81)
RDW: 15.9 % — ABNORMAL HIGH (ref 11.5–15.5)
WBC: 8 10*3/uL (ref 4.0–10.5)
nRBC: 0 % (ref 0.0–0.2)

## 2018-04-14 LAB — CUP PACEART REMOTE DEVICE CHECK
Date Time Interrogation Session: 20191103203942
Implantable Pulse Generator Implant Date: 20190829

## 2018-04-14 MED ORDER — ISOSORBIDE MONONITRATE ER 30 MG PO TB24
15.0000 mg | ORAL_TABLET | Freq: Every day | ORAL | Status: DC
  Administered 2018-04-14 – 2018-04-16 (×3): 15 mg via ORAL
  Filled 2018-04-14 (×3): qty 1

## 2018-04-14 MED ORDER — HYDRALAZINE HCL 25 MG PO TABS
25.0000 mg | ORAL_TABLET | Freq: Three times a day (TID) | ORAL | Status: DC
  Administered 2018-04-14 – 2018-04-15 (×3): 25 mg via ORAL
  Filled 2018-04-14 (×3): qty 1

## 2018-04-14 NOTE — Progress Notes (Addendum)
Progress Note  Patient Name: Billy [name] Date of Encounter: 04/14/2018  Primary Cardiologist: Quay Burow, MD   Subjective   No chest pain or SOB, wants solid food.   Inpatient Medications    Scheduled Meds: . carvedilol  25 mg Oral BID WC  . gabapentin  200 mg Oral QHS  . hydrALAZINE  25 mg Oral Q8H  . pantoprazole  40 mg Intravenous Q12H   Continuous Infusions:  PRN Meds: acetaminophen **OR** acetaminophen, hydrALAZINE, ondansetron **OR** ondansetron (ZOFRAN) IV   Vital Signs    Vitals:   04/13/18 1304 04/13/18 1706 04/13/18 1959 04/14/18 0518  BP: (!) 153/81 (!) 159/86 (!) 160/80 (!) 157/82  Pulse: (!) 50 62 (!) 59 68  Resp: 14  19 16   Temp: 98.2 F (36.8 C)  97.7 F (36.5 C) 98.2 F (36.8 C)  TempSrc: Oral  Oral Oral  SpO2: 99%  100% 99%  Weight:    76.5 kg  Height:        Intake/Output Summary (Last 24 hours) at 04/14/2018 0854 Last data filed at 04/14/2018 0800 Gross per 24 hour  Intake 1179.11 ml  Output 636 ml  Net 543.11 ml   Filed Weights   04/11/18 0534 04/13/18 0458 04/14/18 0518  Weight: 74.6 kg 75.4 kg 76.5 kg    Telemetry    SR, PVs occasionally- Personally Reviewed  ECG    N/A  Physical Exam   General: Well developed, well nourished, male in no acute distress Head: Eyes PERRLA, No xanthomas.   Normocephalic and atraumatic Lungs: Clear bilaterally to auscultation. Heart: HRRR S1 S2, without MRG.  Pulses are 2+ & equal. No JVD. Abdomen: Bowel sounds are present, abdomen soft and non-tender without masses or  hernias noted. Msk: Normal strength and tone for age. Extremities: No clubbing, cyanosis or edema.    Skin:  No rashes or lesions noted. Neuro: Alert and oriented X 3. Psych:  Good affect, responds appropriately   Labs    Chemistry Recent Labs  Lab 04/11/18 0314  04/12/18 1029 04/13/18 0421 04/14/18 0338  NA 142   < > 143 142 145  K 4.0   < > 3.8 3.8 4.3  CL 115*   < > 118* 115* 117*  CO2 14*   < > 17* 17*  20*  GLUCOSE 118*   < > 86 99 112*  BUN 58*   < > 40* 37* 30*  CREATININE 2.94*   < > 2.34* 2.25* 2.23*  CALCIUM 7.8*   < > 8.3* 8.4* 8.2*  PROT 4.5*  --   --   --  4.5*  ALBUMIN 2.6*  --   --   --  2.5*  AST 81*  --   --   --  33  ALT 48*  --   --   --  45*  ALKPHOS 63  --   --   --  63  BILITOT 0.3  --   --   --  0.2*  GFRNONAA 21*   < > 28* 29* 30*  GFRAA 25*   < > 33* 34* 35*  ANIONGAP 13   < > 8 10 8    < > = values in this interval not displayed.     Hematology Recent Labs  Lab 04/11/18 0314  04/12/18 0910 04/13/18 0421 04/14/18 0338  WBC 7.9  --   --  8.8 8.0  RBC 2.00*  --   --  3.29* 3.22*  HGB 5.0*   < >  9.3* 8.6* 8.4*  HCT 17.6*   < > 29.7* 28.3* 27.3*  MCV 88.0  --   --  86.0 84.8  MCH 25.0*  --   --  26.1 26.1  MCHC 28.4*  --   --  30.4 30.8  RDW 17.6*  --   --  16.2* 15.9*  PLT 153  --   --  186 173   < > = values in this interval not displayed.    Cardiac Enzymes Recent Labs  Lab 04/11/18 0415 04/11/18 1226 04/11/18 1833  TROPONINI 1.57* 8.55* 17.23*    Recent Labs  Lab 04/11/18 0313  TROPIPOC 1.97*   Lab Results  Component Value Date   CHOL 204 (H) 12/12/2017   HDL 51 12/12/2017   LDLCALC 140 (H) 12/12/2017   TRIG 65 12/12/2017   CHOLHDL 4.0 12/12/2017     Radiology    US Renal  Result Date: 04/13/2018 CLINICAL DATA:  Acute onset of renal insufficiency. EXAM: RENAL / URINARY TRACT ULTRASOUND COMPLETE COMPARISON:  Renal ultrasound performed 07/27/2017 FINDINGS: Right Kidney: Renal measurements: 11.3 x 4.8 x 5.1 cm = volume: 148.7 mL . Echogenicity within normal limits. No mass or hydronephrosis visualized. Left Kidney: Renal measurements: 7.1 x 4.2 x 3.8 cm = volume: 60.5 mL. Echogenicity within normal limits. No mass or hydronephrosis visualized. Bladder: Appears normal for degree of bladder distention. Small bilateral pleural effusions are noted. Mild ascites is noted within the abdomen and pelvis. IMPRESSION: 1. No evidence of  hydronephrosis. 2. Left renal atrophy noted. 3. Small bilateral pleural effusions. 4. Mild ascites within the abdomen and pelvis. Electronically Signed   By: Garald Balding M.D.   On: 04/13/2018 05:30    Cardiac Studies   Echo 04/13/18 - Left ventricle: The cavity size was mildly dilated. Wall   thickness was increased in a pattern of moderate LVH. Systolic   function was mildly reduced. The estimated ejection fraction was   in the range of 45% to 50%. Features are consistent with a   pseudonormal left ventricular filling pattern, with concomitant   abnormal relaxation and increased filling pressure (grade 2   diastolic dysfunction). Doppler parameters are consistent with   high ventricular filling pressure. - Regional wall motion abnormality: Akinesis of the basal-mid   inferior myocardium; severe hypokinesis of the mid inferoseptal   myocardium; moderate hypokinesis of the basal inferoseptal,   apical inferior, mid inferolateral, and apical septal myocardium. - Aortic valve: Sclerosis without stenosis. There was no   regurgitation. Mean gradient (S): 3 mm Hg. - Mitral valve: Transvalvular velocity was within the normal range.   There was no evidence for stenosis. There was mild regurgitation,   posteriorly directed. Mild anterior leaflet override likely   secondary to posterior leaflet tethering. - Left atrium: The atrium was mildly dilated. - Right ventricle: The cavity size was normal. Wall thickness was   normal. Systolic function was normal. - Right atrium: The atrium was mildly dilated. Central venous   pressure (est): 15 mm Hg. - Atrial septum: No defect or patent foramen ovale was identified. - Tricuspid valve: There was trivial regurgitation. - Inferior vena cava: The vessel was dilated. The respirophasic   diameter changes were blunted (< 50%), consistent with elevated   central venous pressure. - Pericardium, extracardiac: A trivial pericardial effusion was    identified.  Impressions:  - Compared to the prior TTE exam of 12/12/2017, inferior and   inferoseptal wall motion abnormalities have worsened, EF has  decreased. Mitral regurgitation has increased. Side by side   comparison of images performed.     Moderate concentric LVH with valvular thickening. Cannot exclude   cardiac amyloidosis.  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 NSTEMI in setting of acute blood loss - peak Trop 17.23 - Echo results above, EF is lower, +WMA - Statin held due to abnl LFTs, now improved although not completely normal, pta was prescribed Lipito 80 mg but not taking - per Dr Allison Quarry note 12/27, med mgt for now, no ischemic sx - recheck lipids in am  2. Paroxysmal atrial fibrillation - SR for > 24 hr - continue BB  3. NSVT - BB is at max dose - no sx from bradycardia  4. Acute GI bleed with anemia - s/p EGD w/ duodenitis and one non-bleeding duodenal ulcer noted. - per GI/IM - GI to say when can restart Eliquis - per Dr [name], " I feel ulcer is at moderate (30-40%) risk of       significant bleeding with anticoagulation; and, if       bleeding were to occur, this would probably require       IR consultation for angiogram, as the ulcer is in       very odd location and under lots of edema and not       readily treatable endoscopically." - Dr [name] discussed w/ Dr Harding>>wait at least a month for anticoag/DAPT  5.  Essential Hypertension  - SBP 140s-160s last 24 hr - on Coreg 25 mg bid, had edema w/ Norvasc - w/ CKD/ARI, would not restart Aldactone - was on hydralazine 100 mg tid pta, will restart at 25 mg tid and titrate prn  6. Acute on CKD stage  III - BUN/Cr 58/2.94 on admit, trending down   Rosaria Ferries, [facility]   Phone # [phone] [address] [place], [place] [address]    For questions or updates, please contact [place] Please consult [email or web address] for contact info under

## 2018-04-14 NOTE — Progress Notes (Signed)
Subjective: No further melena. No abdominal pain.  Objective: Vital signs in last 24 hours: Temp:  [97.7 F (36.5 C)-98.2 F (36.8 C)] 98.2 F (36.8 C) (12/28 0518) Pulse Rate:  [50-68] 68 (12/28 0518) Resp:  [14-19] 16 (12/28 0518) BP: (146-160)/(66-86) 157/82 (12/28 0518) SpO2:  [96 %-100 %] 99 % (12/28 0518) Weight:  [76.5 kg] 76.5 kg (12/28 0518) Weight change: 1.089 kg Last BM Date: 04/13/18  PE: GEN:  Pale, NAD  Lab Results: CBC    Component Value Date/Time   WBC 8.0 04/14/2018 0338   RBC 3.22 (L) 04/14/2018 0338   HGB 8.4 (L) 04/14/2018 0338   HCT 27.3 (L) 04/14/2018 0338   PLT 173 04/14/2018 0338   MCV 84.8 04/14/2018 0338   MCH 26.1 04/14/2018 0338   MCHC 30.8 04/14/2018 0338   RDW 15.9 (H) 04/14/2018 0338   LYMPHSABS 1.1 04/11/2018 0314   MONOABS 0.9 04/11/2018 0314   EOSABS 0.1 04/11/2018 0314   BASOSABS 0.0 04/11/2018 0314   CMP     Component Value Date/Time   NA 145 04/14/2018 0338   NA 144 12/18/2015 0904   K 4.3 04/14/2018 0338   CL 117 (H) 04/14/2018 0338   CO2 20 (L) 04/14/2018 0338   GLUCOSE 112 (H) 04/14/2018 0338   BUN 30 (H) 04/14/2018 0338   BUN 33 (H) 12/18/2015 0904   CREATININE 2.23 (H) 04/14/2018 0338   CALCIUM 8.2 (L) 04/14/2018 0338   PROT 4.5 (L) 04/14/2018 0338   PROT 5.9 (L) 11/21/2017 0958   ALBUMIN 2.5 (L) 04/14/2018 0338   ALBUMIN 4.2 12/03/2015 0918   AST 33 04/14/2018 0338   ALT 45 (H) 04/14/2018 0338   ALKPHOS 63 04/14/2018 0338   BILITOT 0.2 (L) 04/14/2018 0338   BILITOT <0.2 12/03/2015 0918   GFRNONAA 30 (L) 04/14/2018 0338   GFRAA 35 (L) 04/14/2018 5038   Assessment:  1.  Melena. 2.  Duodenal ulcer, likely NSAID-mediated. 3.  Acute blood loss anemia.  Bleeding appears to have stopped. 4.  NSTEMI.  Plan:  1.  PPI gtt another 24 hours, then transition to iv/po tomorrow. 2.  No NSAIDs. 3.  Would stay off apixaban at least another 2 weeks, unless risks for procoagulant complications are prohibitively  high. 4.  H. Pylori serologies. 5.  Advance diet slowly. 6.  [facility] will follow.   Billy [name] 04/14/2018, 12:02 PM   Cell [phone] If no answer or after 5 PM call [phone]

## 2018-04-14 NOTE — Evaluation (Signed)
Physical Therapy Evaluation Patient Details Name: Billy [name] MRN: [id number] DOB: 1951/03/15 Today's Date: 04/14/2018   History of Present Illness  pt is a 67 y.o. male with medical history significant of A.Fib recently started on eliquis last month, CKD stage 3, and HTN.  Patient presented to the ED with c/o weakness, dyspnea. Stools have been very dark in color. He was admitted for GI hemorrhage with melena.     Clinical Impression  PT eval complete. Pt is independent with all functional mobility, including ambulation 450 feet. See below for further details. Pt reports feeling some generalized fatigue/weakness as his Hgb is currently 8.4. No c/o dizziness with gait. No further skilled PT intervention indicated. Pt encouraged to walk in hallway with nursing. PT signing off.    Follow Up Recommendations No PT follow up    Equipment Recommendations  None recommended by PT    Recommendations for Other Services       Precautions / Restrictions Precautions Precautions: None Restrictions Other Position/Activity Restrictions: hgb 8.4      Mobility  Bed Mobility Overal bed mobility: Independent                Transfers Overall transfer level: Independent Equipment used: None                Ambulation/Gait Ambulation/Gait assistance: Independent Gait Distance (Feet): 450 Feet Assistive device: None Gait Pattern/deviations: WFL(Within Functional Limits)   Gait velocity interpretation: >4.37 ft/sec, indicative of normal walking speed    Stairs            Wheelchair Mobility    Modified Rankin (Stroke Patients Only)       Balance Overall balance assessment: Independent                                           Pertinent Vitals/Pain Pain Assessment: No/denies pain    Home Living Family/patient expects to be discharged to:: Private residence Living Arrangements: Parent Available Help at Discharge: Family;Available  PRN/intermittently Type of Home: House Home Access: Ramped entrance     Home Layout: One level Home Equipment: None Additional Comments: takes care of 43 y.o. mother    Prior Function Level of Independence: Independent               Hand Dominance   Dominant Hand: Right    Extremity/Trunk Assessment   Upper Extremity Assessment Upper Extremity Assessment: Overall WFL for tasks assessed         Cervical / Trunk Assessment Cervical / Trunk Assessment: Normal  Communication   Communication: No difficulties  Cognition Arousal/Alertness: Awake/alert Behavior During Therapy: WFL for tasks assessed/performed Overall Cognitive Status: Within Functional Limits for tasks assessed                                        General Comments      Exercises     Assessment/Plan    PT Assessment Patent does not need any further PT services  PT Problem List         PT Treatment Interventions      PT Goals (Current goals can be found in the Care Plan section)  Acute Rehab PT Goals Patient Stated Goal: home PT Goal Formulation: All assessment and education complete, DC therapy  Frequency     Barriers to discharge        Co-evaluation               AM-PAC PT "6 Clicks" Mobility  Outcome Measure Help needed turning from your back to your side while in a flat bed without using bedrails?: None Help needed moving from lying on your back to sitting on the side of a flat bed without using bedrails?: None Help needed moving to and from a bed to a chair (including a wheelchair)?: None Help needed standing up from a chair using your arms (e.g., wheelchair or bedside chair)?: None Help needed to walk in hospital room?: None Help needed climbing 3-5 steps with a railing? : None 6 Click Score: 24    End of Session   Activity Tolerance: Patient tolerated treatment well Patient left: in bed;with call bell/phone within reach Nurse Communication:  Mobility status PT Visit Diagnosis: Difficulty in walking, not elsewhere classified (R26.2)    Time: 0315-9458 PT Time Calculation (min) (ACUTE ONLY): 15 min   Charges:   PT Evaluation $PT Eval Low Complexity: 1 Low          Lorrin Goodell, PT  Office # [phone] Pager [phone]   Lorriane Shire 04/14/2018, 12:40 PM

## 2018-04-14 NOTE — Progress Notes (Signed)
PROGRESS NOTE  Billy [name] BMW:[id number] DOB: 1951-02-19 DOA: 04/11/2018 PCP: System, Pcp Not In  HPI/Recap of past 24 hours: Billy [name] a 67 y.o.malewith medical history significant ofA.Fib recently started on eliquis last month, CKD stage 3, HTN , h/o DVT, peripheral neuropathy, presents with severe symptomatic anemia and NSTEMI.   04/13/2018: Patient seen and examined at bedside.  No acute events overnight.  Reports he had his last bowel movement yesterday.  Denies abdominal pain, or nausea.  Denies chest pain, dyspnea or palpitations.  EGD planned today.  EGD revealed 1 nonbleeding duodenal ulcer with duodenitis.  04/14/2018: Patient seen and examined at bedside.  No acute events overnight.  No new complaints.  Will need to be off Eliquis for at least 2 weeks per GI.  Slowly advance diet as recommended by GI.  Assessment/Plan: Principal Problem:   Gastrointestinal hemorrhage with melena Active Problems:   Kidney disease, chronic, stage III (GFR 30-59 ml/min) (HCC)   Hypertension   AKI (acute kidney injury) ([place])   Acute blood loss anemia   Metabolic acidosis, normal anion gap (NAG)  Acute blood loss anemia possible upper GI / malena: - s/p 3 units of prbc transfusion and check H&h every 8 hours and keep hemoglobin greater than 9.  - GI consulted and plan for EGD in the next 24 to 48 hours.  -EGD completed today 04/13/2018 by Dr. [name] revealed 1 nonbleeding duodenal ulcer with duodenitis -Continue PPI drip -Continue to monitor H&H -Hemoglobin 8.6 from 9.3 yesterday  Nonbleeding duodenal ulcer/duodenitis Findings from EGD done today 04/13/2018 by Dr. [name] Continue PPI drip Slowly advance diet as recommended by GI  NSVT: Asymptomatic and on coreg.   NSTEMI;  Symptomatic anemia vs CAD Cardiology on board and plan for cath when GI source of bleeding has been found.   AKI on stage 3 CKD: From hypoperfusion from severe anemia and dehydration. Baseline creatinine  appears to be around 2.  Improving Creatinine 2.23 from 2.25 from 2.34 yesterday  Peripheral neuropathy:  - On gabapentin. Resume the same.    DVT prophylaxis: SCD'S Code Status: FULL CODE.  Family Communication: None at bedside.  Disposition Plan: pending further eval.   Consultants:   Cardiology Dr Ellyn Hack  Gastroenterology Dr [name].   Procedures: Echocardiogram.   Antimicrobials: None.      Objective: Vitals:   04/13/18 1959 04/14/18 0518 04/14/18 0800 04/14/18 1317  BP: (!) 160/80 (!) 157/82  (!) 155/82  Pulse: (!) 59 68  (!) 52  Resp: 19 16 18 16   Temp: 97.7 F (36.5 C) 98.2 F (36.8 C)  97.6 F (36.4 C)  TempSrc: Oral Oral  Oral  SpO2: 100% 99%  100%  Weight:  76.5 kg    Height:        Intake/Output Summary (Last 24 hours) at 04/14/2018 1647 Last data filed at 04/14/2018 1318 Gross per 24 hour  Intake 1350.01 ml  Output 1036 ml  Net 314.01 ml   Filed Weights   04/11/18 0534 04/13/18 0458 04/14/18 0518  Weight: 74.6 kg 75.4 kg 76.5 kg    Exam:  . General: 67 y.o. year-old male well developed well-nourished in no acute distress.  Alert oriented x3. . Cardiovascular: Regular rate and rhythm with no rubs or gallops.  No JVD or thyromegaly noted.  Respiratory: Clear to auscultation with no wheezes or rales.  Good inspiratory effort. . Abdomen: Soft nontender nondistended with normal bowel sounds x4 quadrants. . Musculoskeletal: No lower extremity edema. 2/4 pulses in  all 4 extremities. . Skin: No ulcerative lesions noted or rashes . Psychiatry: Mood is appropriate for condition and setting   Data Reviewed: CBC: Recent Labs  Lab 04/11/18 0314  04/11/18 1833 04/12/18 0201 04/12/18 0910 04/13/18 0421 04/14/18 0338  WBC 7.9  --   --   --   --  8.8 8.0  NEUTROABS 5.7  --   --   --   --   --   --   HGB 5.0*   < > 9.3* 8.5* 9.3* 8.6* 8.4*  HCT 17.6*   < > 29.8* 27.3* 29.7* 28.3* 27.3*  MCV 88.0  --   --   --   --  86.0 84.8  PLT 153  --    --   --   --  186 173   < > = values in this interval not displayed.   Basic Metabolic Panel: Recent Labs  Lab 04/11/18 0314 04/11/18 1226 04/11/18 1833 04/12/18 1029 04/13/18 0421 04/14/18 0338  NA 142 143  --  143 142 145  K 4.0 3.5 4.2 3.8 3.8 4.3  CL 115* 116*  --  118* 115* 117*  CO2 14* 17*  --  17* 17* 20*  GLUCOSE 118* 105*  --  86 99 112*  BUN 58* 53*  --  40* 37* 30*  CREATININE 2.94* 2.68*  --  2.34* 2.25* 2.23*  CALCIUM 7.8* 8.2*  --  8.3* 8.4* 8.2*  MG  --   --  2.4  --   --   --    GFR: Estimated Creatinine Clearance: 35.7 mL/min (A) (by C-G formula based on SCr of 2.23 mg/dL (H)). Liver Function Tests: Recent Labs  Lab 04/11/18 0314 04/14/18 0338  AST 81* 33  ALT 48* 45*  ALKPHOS 63 63  BILITOT 0.3 0.2*  PROT 4.5* 4.5*  ALBUMIN 2.6* 2.5*   Recent Labs  Lab 04/11/18 0314  LIPASE 48   No results for input(s): AMMONIA in the last 168 hours. Coagulation Profile: No results for input(s): INR, PROTIME in the last 168 hours. Cardiac Enzymes: Recent Labs  Lab 04/11/18 0415 04/11/18 1226 04/11/18 1833  TROPONINI 1.57* 8.55* 17.23*   BNP (last 3 results) No results for input(s): PROBNP in the last 8760 hours. HbA1C: No results for input(s): HGBA1C in the last 72 hours. CBG: No results for input(s): GLUCAP in the last 168 hours. Lipid Profile: Recent Labs    04/14/18 0338  CHOL 141  HDL 34*  LDLCALC 97  TRIG 52  CHOLHDL 4.1   Thyroid Function Tests: No results for input(s): TSH, T4TOTAL, FREET4, T3FREE, THYROIDAB in the last 72 hours. Anemia Panel: No results for input(s): VITAMINB12, FOLATE, FERRITIN, TIBC, IRON, RETICCTPCT in the last 72 hours. Urine analysis:    Component Value Date/Time   COLORURINE YELLOW 03/20/2017 2227   APPEARANCEUR CLEAR 03/20/2017 2227   LABSPEC 1.020 03/20/2017 2227   PHURINE 5.0 03/20/2017 2227   GLUCOSEU NEGATIVE 03/20/2017 2227   HGBUR SMALL (A) 03/20/2017 2227   BILIRUBINUR NEGATIVE 03/20/2017 2227     KETONESUR NEGATIVE 03/20/2017 2227   PROTEINUR 100 (A) 03/20/2017 2227   NITRITE NEGATIVE 03/20/2017 2227   LEUKOCYTESUR NEGATIVE 03/20/2017 2227   Sepsis Labs: @LABRCNTIP (procalcitonin:4,lacticidven:4)  )No results found for this or any previous visit (from the past 240 hour(s)).    Studies: No results found.  Scheduled Meds: . carvedilol  25 mg Oral BID WC  . gabapentin  200 mg Oral QHS  . hydrALAZINE  25 mg Oral Q8H  . isosorbide mononitrate  15 mg Oral Daily  . pantoprazole  40 mg Intravenous Q12H    Continuous Infusions:    LOS: 3 days     Kayleen Memos, MD [facility] Pager [phone]  If 7PM-7AM, please contact night-coverage [email or web address] Password [facility] 04/14/2018, 4:47 PM

## 2018-04-14 NOTE — Plan of Care (Signed)
Serial Hgb and Hct lab draws to monitor anemia. Advance diet per MD order. Cardiact Monitor. VS per Routine.

## 2018-04-15 ENCOUNTER — Encounter (HOSPITAL_COMMUNITY): Payer: Self-pay | Admitting: Gastroenterology

## 2018-04-15 LAB — CBC
HCT: 28.2 % — ABNORMAL LOW (ref 39.0–52.0)
Hemoglobin: 8.5 g/dL — ABNORMAL LOW (ref 13.0–17.0)
MCH: 25.4 pg — AB (ref 26.0–34.0)
MCHC: 30.1 g/dL (ref 30.0–36.0)
MCV: 84.2 fL (ref 80.0–100.0)
Platelets: 182 10*3/uL (ref 150–400)
RBC: 3.35 MIL/uL — AB (ref 4.22–5.81)
RDW: 15.5 % (ref 11.5–15.5)
WBC: 7.6 10*3/uL (ref 4.0–10.5)
nRBC: 0 % (ref 0.0–0.2)

## 2018-04-15 LAB — BASIC METABOLIC PANEL
Anion gap: 9 (ref 5–15)
BUN: 23 mg/dL (ref 8–23)
CO2: 19 mmol/L — AB (ref 22–32)
Calcium: 8.3 mg/dL — ABNORMAL LOW (ref 8.9–10.3)
Chloride: 115 mmol/L — ABNORMAL HIGH (ref 98–111)
Creatinine, Ser: 1.97 mg/dL — ABNORMAL HIGH (ref 0.61–1.24)
GFR calc Af Amer: 40 mL/min — ABNORMAL LOW (ref >60)
GFR calc non Af Amer: 35 mL/min — ABNORMAL LOW (ref >60)
Glucose, Bld: 92 mg/dL (ref 70–99)
Potassium: 3.9 mmol/L (ref 3.5–5.1)
Sodium: 143 mmol/L (ref 135–145)

## 2018-04-15 MED ORDER — PANTOPRAZOLE SODIUM 40 MG PO TBEC
40.0000 mg | DELAYED_RELEASE_TABLET | Freq: Two times a day (BID) | ORAL | Status: DC
  Administered 2018-04-15 – 2018-04-16 (×2): 40 mg via ORAL
  Filled 2018-04-15 (×2): qty 1

## 2018-04-15 MED ORDER — HYDRALAZINE HCL 50 MG PO TABS
50.0000 mg | ORAL_TABLET | Freq: Three times a day (TID) | ORAL | Status: DC
  Administered 2018-04-15 – 2018-04-16 (×3): 50 mg via ORAL
  Filled 2018-04-15 (×3): qty 1

## 2018-04-15 NOTE — Progress Notes (Signed)
PROGRESS NOTE  Billy [name] UJW:[id number] DOB: 07-17-1950 DOA: 04/11/2018 PCP: System, Pcp Not In  HPI/Recap of past 24 hours: Billy [name] a 67 y.o.malewith medical history significant ofA.Fib recently started on eliquis last month, CKD stage 3, HTN , h/o DVT, peripheral neuropathy, presents with severe symptomatic anemia and NSTEMI.   04/13/2018: Patient seen and examined at bedside.  No acute events overnight.  Reports he had his last bowel movement yesterday.  Denies abdominal pain, or nausea.  Denies chest pain, dyspnea or palpitations.  EGD planned today.  EGD revealed 1 nonbleeding duodenal ulcer with duodenitis.  04/14/2018: Patient seen and examined at bedside.  No acute events overnight.  No new complaints.  Will need to be off Eliquis for at least 2 weeks per GI.  Slowly advance diet as recommended by GI.  04/15/18: Patient seen and examined at bedside.  No acute events overnight.  Reports dark stool last night.  Hemoglobin stable at 8.5.  No new complaints.  Continue to hold Eliquis another 10 to 14 days as requested by GI.    Assessment/Plan: Principal Problem:   Gastrointestinal hemorrhage with melena Active Problems:   Kidney disease, chronic, stage III (GFR 30-59 ml/min) (HCC)   Hypertension   AKI (acute kidney injury) ([place])   Acute blood loss anemia   Metabolic acidosis, normal anion gap (NAG)  Acute blood loss anemia possible upper GI / malena: - s/p 3 units of prbc transfusion and check H&h every 8 hours and keep hemoglobin greater than 9.  - GI consulted and plan for EGD in the next 24 to 48 hours.  -EGD completed today 04/13/2018 by Dr. [name] revealed 1 nonbleeding duodenal ulcer with duodenitis -Protonix changed to p.o. -Hemoglobin stable at 8.5 -Repeat CBC in the morning  Nonbleeding duodenal ulcer/duodenitis Findings from EGD done today 04/13/2018 by Dr. [name] Continue PPI drip Slowly advance diet as recommended by GI Continue to hold Eliquis for  another 10 to 14 days as recommended by GI  NSVT: Asymptomatic and on coreg.   NSTEMI;  Symptomatic anemia vs CAD Cardiology on board and plan for cath when GI source of bleeding has been found.   Improving AKI on stage 3 CKD: From hypoperfusion from severe anemia and dehydration. Baseline creatinine appears to be around 2.  Improving Renal function continues to improve creatinine 1.97 from 2.23 from 2.25 from 2.34 yesterday  Peripheral neuropathy:  - On gabapentin. Resume the same.    DVT prophylaxis: SCD'S Code Status: FULL CODE.  Family Communication: None at bedside.  Disposition Plan:  Possible discharge to home tomorrow 04/16/2018 or when GI signs off.   Consultants:   Cardiology Dr Ellyn Hack  Gastroenterology Dr [name].   Procedures: Echocardiogram.   Antimicrobials: None.      Objective: Vitals:   04/14/18 2022 04/15/18 0506 04/15/18 0741 04/15/18 0745  BP: (!) 154/80 (!) 160/83  (!) 160/84  Pulse: 60 64  64  Resp: 14 15 18    Temp: 98.9 F (37.2 C) 98.6 F (37 C)  98.5 F (36.9 C)  TempSrc: Oral Oral  Oral  SpO2: 99% 99%  99%  Weight:  77.1 kg    Height:  6\' 2"  (1.88 m)      Intake/Output Summary (Last 24 hours) at 04/15/2018 1554 Last data filed at 04/15/2018 0900 Gross per 24 hour  Intake 240 ml  Output 100 ml  Net 140 ml   Filed Weights   04/13/18 0458 04/14/18 0518 04/15/18 0506  Weight: 75.4 kg  76.5 kg 77.1 kg    Exam:  . General: 67 y.o. year-old male well-developed well-nourished in no acute distress.  Alert and oriented x3. . Cardiovascular: Regular rate and rhythm with no rubs or gallops.  No JVD or thyromegaly noted.  Respiratory: Clear to auscultation with no wheezes or rales.  Good inspiratory effort. . Abdomen: Soft nontender nondistended with normal bowel sounds x4 quadrants. . Musculoskeletal: No lower extremity edema. 2/4 pulses in all 4 extremities. . Skin: No ulcerative lesions noted or rashes . Psychiatry: Mood  is appropriate for condition and setting   Data Reviewed: CBC: Recent Labs  Lab 04/11/18 0314  04/12/18 0201 04/12/18 0910 04/13/18 0421 04/14/18 0338 04/15/18 0459  WBC 7.9  --   --   --  8.8 8.0 7.6  NEUTROABS 5.7  --   --   --   --   --   --   HGB 5.0*   < > 8.5* 9.3* 8.6* 8.4* 8.5*  HCT 17.6*   < > 27.3* 29.7* 28.3* 27.3* 28.2*  MCV 88.0  --   --   --  86.0 84.8 84.2  PLT 153  --   --   --  186 173 182   < > = values in this interval not displayed.   Basic Metabolic Panel: Recent Labs  Lab 04/11/18 1226 04/11/18 1833 04/12/18 1029 04/13/18 0421 04/14/18 0338 04/15/18 0459  NA 143  --  143 142 145 143  K 3.5 4.2 3.8 3.8 4.3 3.9  CL 116*  --  118* 115* 117* 115*  CO2 17*  --  17* 17* 20* 19*  GLUCOSE 105*  --  86 99 112* 92  BUN 53*  --  40* 37* 30* 23  CREATININE 2.68*  --  2.34* 2.25* 2.23* 1.97*  CALCIUM 8.2*  --  8.3* 8.4* 8.2* 8.3*  MG  --  2.4  --   --   --   --    GFR: Estimated Creatinine Clearance: 40.8 mL/min (A) (by C-G formula based on SCr of 1.97 mg/dL (H)). Liver Function Tests: Recent Labs  Lab 04/11/18 0314 04/14/18 0338  AST 81* 33  ALT 48* 45*  ALKPHOS 63 63  BILITOT 0.3 0.2*  PROT 4.5* 4.5*  ALBUMIN 2.6* 2.5*   Recent Labs  Lab 04/11/18 0314  LIPASE 48   No results for input(s): AMMONIA in the last 168 hours. Coagulation Profile: No results for input(s): INR, PROTIME in the last 168 hours. Cardiac Enzymes: Recent Labs  Lab 04/11/18 0415 04/11/18 1226 04/11/18 1833  TROPONINI 1.57* 8.55* 17.23*   BNP (last 3 results) No results for input(s): PROBNP in the last 8760 hours. HbA1C: No results for input(s): HGBA1C in the last 72 hours. CBG: No results for input(s): GLUCAP in the last 168 hours. Lipid Profile: Recent Labs    04/14/18 0338  CHOL 141  HDL 34*  LDLCALC 97  TRIG 52  CHOLHDL 4.1   Thyroid Function Tests: No results for input(s): TSH, T4TOTAL, FREET4, T3FREE, THYROIDAB in the last 72 hours. Anemia  Panel: No results for input(s): VITAMINB12, FOLATE, FERRITIN, TIBC, IRON, RETICCTPCT in the last 72 hours. Urine analysis:    Component Value Date/Time   COLORURINE YELLOW 03/20/2017 2227   APPEARANCEUR CLEAR 03/20/2017 2227   LABSPEC 1.020 03/20/2017 2227   PHURINE 5.0 03/20/2017 2227   GLUCOSEU NEGATIVE 03/20/2017 2227   HGBUR SMALL (A) 03/20/2017 2227   BILIRUBINUR NEGATIVE 03/20/2017 2227   KETONESUR NEGATIVE 03/20/2017  [place] (A) 03/20/2017 2227   NITRITE NEGATIVE 03/20/2017 2227   LEUKOCYTESUR NEGATIVE 03/20/2017 2227   Sepsis Labs: @LABRCNTIP (procalcitonin:4,lacticidven:4)  )No results found for this or any previous visit (from the past 240 hour(s)).    Studies: No results found.  Scheduled Meds: . carvedilol  25 mg Oral BID WC  . gabapentin  200 mg Oral QHS  . hydrALAZINE  50 mg Oral Q8H  . isosorbide mononitrate  15 mg Oral Daily  . pantoprazole  40 mg Oral BID AC    Continuous Infusions:    LOS: 4 days     Kayleen Memos, MD [facility] Pager [phone]  If 7PM-7AM, please contact night-coverage [email or web address] Password [facility] 04/15/2018, 3:54 PM

## 2018-04-15 NOTE — Plan of Care (Signed)
Cardiac Monitor. AM Labs. 

## 2018-04-15 NOTE — Progress Notes (Signed)
Subjective: Dark stool yesterday. No abdominal pain.  Objective: Vital signs in last 24 hours: Temp:  [97.6 F (36.4 C)-98.9 F (37.2 C)] 98.5 F (36.9 C) (12/29 0745) Pulse Rate:  [52-64] 64 (12/29 0745) Resp:  [14-16] 15 (12/29 0506) BP: (154-160)/(80-84) 160/84 (12/29 0745) SpO2:  [99 %-100 %] 99 % (12/29 0745) Weight:  [77.1 kg] 77.1 kg (12/29 0506) Weight change: 0.59 kg Last BM Date: 04/14/18  PE: GEN:  NAD  Lab Results: CBC    Component Value Date/Time   WBC 7.6 04/15/2018 0459   RBC 3.35 (L) 04/15/2018 0459   HGB 8.5 (L) 04/15/2018 0459   HCT 28.2 (L) 04/15/2018 0459   PLT 182 04/15/2018 0459   MCV 84.2 04/15/2018 0459   MCH 25.4 (L) 04/15/2018 0459   MCHC 30.1 04/15/2018 0459   RDW 15.5 04/15/2018 0459   LYMPHSABS 1.1 04/11/2018 0314   MONOABS 0.9 04/11/2018 0314   EOSABS 0.1 04/11/2018 0314   BASOSABS 0.0 04/11/2018 0314    Assessment:  1.  Melena. 2.  Duodenal ulcer, likely NSAID-mediated. 3.  Acute blood loss anemia.  Bleeding appears to have stopped. 4.  NSTEMI.  Plan:  1.  Change to po protonix. 2.  H. Pylori serologies pending. 3.  Hold apixaban another 10-14 days if possible. 4.  Advance diet. 5.  Hopefully home tomorrow from [facility] perspective, with cardiac cath plans on hold pending improvement of patient's duodenal ulcer.   Landry Dyke 04/15/2018, 12:25 PM   Cell [phone] If no answer or after 5 PM call [phone]

## 2018-04-15 NOTE — Progress Notes (Addendum)
Progress Note  Patient Name: Billy [name] Date of Encounter: 04/15/2018  Primary Cardiologist: Quay Burow, MD   Subjective   Denies any chest pain or shortness of breath.  Inpatient Medications    Scheduled Meds: . carvedilol  25 mg Oral BID WC  . gabapentin  200 mg Oral QHS  . hydrALAZINE  50 mg Oral Q8H  . isosorbide mononitrate  15 mg Oral Daily  . pantoprazole  40 mg Intravenous Q12H   Continuous Infusions:  PRN Meds: acetaminophen **OR** acetaminophen, hydrALAZINE, ondansetron **OR** ondansetron (ZOFRAN) IV   Vital Signs    Vitals:   04/14/18 1317 04/14/18 2022 04/15/18 0506 04/15/18 0745  BP: (!) 155/82 (!) 154/80 (!) 160/83 (!) 160/84  Pulse: (!) 52 60 64 64  Resp: 16 14 15    Temp: 97.6 F (36.4 C) 98.9 F (37.2 C) 98.6 F (37 C) 98.5 F (36.9 C)  TempSrc: Oral Oral Oral Oral  SpO2: 100% 99% 99% 99%  Weight:   77.1 kg   Height:   6\' 2"  (1.88 m)     Intake/Output Summary (Last 24 hours) at 04/15/2018 0814 Last data filed at 04/15/2018 2841 Gross per 24 hour  Intake 720 ml  Output 500 ml  Net 220 ml   Filed Weights   04/13/18 0458 04/14/18 0518 04/15/18 0506  Weight: 75.4 kg 76.5 kg 77.1 kg    Telemetry    Normal sinus rhythm with occasional PVCs- Personally Reviewed  ECG    No new EKG to review  Physical Exam   GEN: Well nourished, well developed in no acute distress HEENT: Normal NECK: No JVD; No carotid bruits LYMPHATICS: No lymphadenopathy CARDIAC:RRR, no murmurs, rubs, gallops RESPIRATORY:  Clear to auscultation without rales, wheezing or rhonchi  ABDOMEN: Soft, non-tender, non-distended MUSCULOSKELETAL:  No edema; No deformity  SKIN: Warm and dry NEUROLOGIC:  Alert and oriented x 3 PSYCHIATRIC:  Normal affect    Labs    Chemistry Recent Labs  Lab 04/11/18 0314  04/13/18 0421 04/14/18 0338 04/15/18 0459  NA 142   < > 142 145 143  K 4.0   < > 3.8 4.3 3.9  CL 115*   < > 115* 117* 115*  CO2 14*   < > 17* 20* 19*    GLUCOSE 118*   < > 99 112* 92  BUN 58*   < > 37* 30* 23  CREATININE 2.94*   < > 2.25* 2.23* 1.97*  CALCIUM 7.8*   < > 8.4* 8.2* 8.3*  PROT 4.5*  --   --  4.5*  --   ALBUMIN 2.6*  --   --  2.5*  --   AST 81*  --   --  33  --   ALT 48*  --   --  45*  --   ALKPHOS 63  --   --  63  --   BILITOT 0.3  --   --  0.2*  --   GFRNONAA 21*   < > 29* 30* 35*  GFRAA 25*   < > 34* 35* 40*  ANIONGAP 13   < > 10 8 9    < > = values in this interval not displayed.     Hematology Recent Labs  Lab 04/13/18 0421 04/14/18 0338 04/15/18 0459  WBC 8.8 8.0 7.6  RBC 3.29* 3.22* 3.35*  HGB 8.6* 8.4* 8.5*  HCT 28.3* 27.3* 28.2*  MCV 86.0 84.8 84.2  MCH 26.1 26.1 25.4*  MCHC 30.4 30.8 30.1  RDW 16.2* 15.9* 15.5  PLT 186 173 182    Cardiac Enzymes Recent Labs  Lab 04/11/18 0415 04/11/18 1226 04/11/18 1833  TROPONINI 1.57* 8.55* 17.23*    Recent Labs  Lab 04/11/18 0313  TROPIPOC 1.97*   Lab Results  Component Value Date   CHOL 141 04/14/2018   HDL 34 (L) 04/14/2018   LDLCALC 97 04/14/2018   TRIG 52 04/14/2018   CHOLHDL 4.1 04/14/2018     Radiology    No results found.  Cardiac Studies   Echo 04/13/18 - Left ventricle: The cavity size was mildly dilated. Wall   thickness was increased in a pattern of moderate LVH. Systolic   function was mildly reduced. The estimated ejection fraction was   in the range of 45% to 50%. Features are consistent with a   pseudonormal left ventricular filling pattern, with concomitant   abnormal relaxation and increased filling pressure (grade 2   diastolic dysfunction). Doppler parameters are consistent with   high ventricular filling pressure. - Regional wall motion abnormality: Akinesis of the basal-mid   inferior myocardium; severe hypokinesis of the mid inferoseptal   myocardium; moderate hypokinesis of the basal inferoseptal,   apical inferior, mid inferolateral, and apical septal myocardium. - Aortic valve: Sclerosis without stenosis.  There was no   regurgitation. Mean gradient (S): 3 mm Hg. - Mitral valve: Transvalvular velocity was within the normal range.   There was no evidence for stenosis. There was mild regurgitation,   posteriorly directed. Mild anterior leaflet override likely   secondary to posterior leaflet tethering. - Left atrium: The atrium was mildly dilated. - Right ventricle: The cavity size was normal. Wall thickness was   normal. Systolic function was normal. - Right atrium: The atrium was mildly dilated. Central venous   pressure (est): 15 mm Hg. - Atrial septum: No defect or patent foramen ovale was identified. - Tricuspid valve: There was trivial regurgitation. - Inferior vena cava: The vessel was dilated. The respirophasic   diameter changes were blunted (< 50%), consistent with elevated   central venous pressure. - Pericardium, extracardiac: A trivial pericardial effusion was   identified.  Impressions:  - Compared to the prior TTE exam of 12/12/2017, inferior and   inferoseptal wall motion abnormalities have worsened, EF has   decreased. Mitral regurgitation has increased. Side by side   comparison of images performed.     Moderate concentric LVH with valvular thickening. Cannot exclude   cardiac amyloidosis.  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 NSTEMI in setting of acute blood loss - peak Trop 17.23 - Echo shows no regional wall motion abnormalities likely due to severe anemia. -Due to severe anemia secondary to GI bleed with ulcer unamenable to endoscopic cauterization, medical management of his non-STEMI has been recommended for now with cath planned in the future once he can be on DAPT.  GI  recommends waiting 1 month. -Statin currently on hold due to elevated LFTs. -We will repeat LFTs on Monday and if they have normalized will restart statin.  Was on Lipitor 80 mg daily prior to this. -Continue beta-blocker and Imdur 15 mg daily  2. Paroxysmal  atrial fibrillation -He has been maintaining normal sinus rhythm -Continue carvedilol 25 mg twice daily -DOAC on hold due to GI bleed -GI recommends holding 1 month to allow ulcer to heal as it was not amenable to endoscopic cauterization due to location and associated edema  3. NSVT - BB is at max dose - no sx from bradycardia - No further VT on monitor  4. Acute GI bleed with anemia - s/p EGD w/ duodenitis and one non-bleeding duodenal ulcer noted. - per Dr [name], " I feel ulcer is at moderate (30-40%) risk of       significant bleeding with anticoagulation; and, if       bleeding were to occur, this would probably require       IR consultation for angiogram, as the ulcer is in       very odd location and under lots of edema and not       readily treatable endoscopically." -Hemoglobin remained stable 8.5 - per GI note yesterday need to be off anticoagulation another 2 weeks  5.  Essential Hypertension  -BP remains elevated at 160/84 mmHg this morning - on Coreg 25 mg bid, had edema w/ Norvasc - w/ CKD/ARI, would not restart Aldactone or add ARB/ACE - Increase hydralazine to 50 mg every 8 hours  6. Acute on CKD stage III - BUN/Cr 58/2.94 on admit, trending down and 1.97 today   Fransico Him, [facility]   Phone # [phone] [place] [place], [place] [address]    For questions or updates, please contact [place] Please consult [email or web address] for contact info under

## 2018-04-16 LAB — BASIC METABOLIC PANEL
Anion gap: 7 (ref 5–15)
BUN: 21 mg/dL (ref 8–23)
CO2: 21 mmol/L — ABNORMAL LOW (ref 22–32)
CREATININE: 2.01 mg/dL — AB (ref 0.61–1.24)
Calcium: 8.3 mg/dL — ABNORMAL LOW (ref 8.9–10.3)
Chloride: 114 mmol/L — ABNORMAL HIGH (ref 98–111)
GFR calc Af Amer: 39 mL/min — ABNORMAL LOW (ref >60)
GFR calc non Af Amer: 34 mL/min — ABNORMAL LOW (ref >60)
Glucose, Bld: 98 mg/dL (ref 70–99)
Potassium: 4 mmol/L (ref 3.5–5.1)
Sodium: 142 mmol/L (ref 135–145)

## 2018-04-16 LAB — HEPATIC FUNCTION PANEL
ALK PHOS: 58 U/L (ref 38–126)
ALT: 28 U/L (ref 0–44)
AST: 20 U/L (ref 15–41)
Albumin: 2.4 g/dL — ABNORMAL LOW (ref 3.5–5.0)
Bilirubin, Direct: <0.1 mg/dL (ref 0.0–0.2)
TOTAL PROTEIN: 4.8 g/dL — AB (ref 6.5–8.1)
Total Bilirubin: 0.3 mg/dL (ref 0.3–1.2)

## 2018-04-16 LAB — CBC
HCT: 28.9 % — ABNORMAL LOW (ref 39.0–52.0)
Hemoglobin: 8.7 g/dL — ABNORMAL LOW (ref 13.0–17.0)
MCH: 25.1 pg — ABNORMAL LOW (ref 26.0–34.0)
MCHC: 30.1 g/dL (ref 30.0–36.0)
MCV: 83.5 fL (ref 80.0–100.0)
Platelets: 202 10*3/uL (ref 150–400)
RBC: 3.46 MIL/uL — ABNORMAL LOW (ref 4.22–5.81)
RDW: 15.2 % (ref 11.5–15.5)
WBC: 7.7 10*3/uL (ref 4.0–10.5)
nRBC: 0 % (ref 0.0–0.2)

## 2018-04-16 LAB — H. PYLORI ANTIBODY, IGG: H Pylori IgG: 0.25 Index Value (ref 0.00–0.79)

## 2018-04-16 MED ORDER — HYDRALAZINE HCL 50 MG PO TABS: 75.0000 mg | ORAL_TABLET | Freq: Three times a day (TID) | ORAL | Status: DC

## 2018-04-16 MED ORDER — HYDRALAZINE HCL 25 MG PO TABS: 75.0000 mg | ORAL_TABLET | Freq: Three times a day (TID) | ORAL | 0 refills | Status: DC

## 2018-04-16 MED ORDER — ATORVASTATIN CALCIUM 80 MG PO TABS: 80.0000 mg | ORAL_TABLET | Freq: Every day | ORAL | 0 refills | Status: DC

## 2018-04-16 MED ORDER — ATORVASTATIN CALCIUM 80 MG PO TABS: 80.0000 mg | ORAL_TABLET | Freq: Every day | ORAL | Status: DC

## 2018-04-16 MED ORDER — ISOSORBIDE MONONITRATE ER 30 MG PO TB24: 15.0000 mg | ORAL_TABLET | Freq: Every day | ORAL | 0 refills | Status: DC

## 2018-04-16 MED ORDER — PANTOPRAZOLE SODIUM 40 MG PO TBEC: 40.0000 mg | DELAYED_RELEASE_TABLET | Freq: Two times a day (BID) | ORAL | 0 refills | Status: DC

## 2018-04-16 MED ORDER — CARVEDILOL 25 MG PO TABS: 12.5000 mg | ORAL_TABLET | Freq: Two times a day (BID) | ORAL | 0 refills | Status: DC

## 2018-04-16 MED ORDER — GABAPENTIN 100 MG PO CAPS: 200.0000 mg | ORAL_CAPSULE | Freq: Every day | ORAL | 0 refills | Status: DC

## 2018-04-16 NOTE — Discharge Instructions (Signed)
Chronic Kidney Disease, Adult Chronic kidney disease (CKD) happens when the kidneys are damaged over a long period of time. The kidneys are two organs that help with:  Getting rid of waste and extra fluid from the blood.  Making hormones that maintain the amount of fluid in your tissues and blood vessels.  Making sure that the body has the right amount of fluids and chemicals. Most of the time, CKD does not go away, but it can usually be controlled. Steps must be taken to slow down the kidney damage or to stop it from getting worse. If this is not done, the kidneys may stop working. Follow these instructions at home: Medicines  Take over-the-counter and prescription medicines only as told by your doctor. You may need to change the amount of medicines you take.  Do not take any new medicines unless your doctor says it is okay. Many medicines can make your kidney damage worse.  Do not take any vitamin and supplements unless your doctor says it is okay. Many vitamins and supplements can make your kidney damage worse. General instructions  Follow a diet as told by your doctor. You may need to stay away from: ? Alcohol. ? Salty foods. ? Foods that are high in:  Potassium.  Calcium.  Protein.  Do not use any products that contain nicotine or tobacco, such as cigarettes and e-cigarettes. If you need help quitting, ask your doctor.  Keep track of your blood pressure at home. Tell your doctor about any changes.  If you have diabetes, keep track of your blood sugar as told by your doctor.  Try to stay at a healthy weight. If you need help, ask your doctor.  Exercise at least 30 minutes a day, 5 days a week.  Stay up-to-date with your shots (immunizations) as told by your doctor.  Keep all follow-up visits as told by your doctor. This is important. Contact a doctor if:  Your symptoms get worse.  You have new symptoms. Get help right away if:  You have symptoms of end-stage  kidney disease. These may include: ? Headaches. ? Numbness in your hands or feet. ? Easy bruising. ? Having hiccups often. ? Chest pain. ? Shortness of breath. ? Stopping of menstrual periods in women.  You have a fever.  You have very little pee (urine).  You have pain or bleeding when you pee. Summary  Chronic kidney disease (CKD) happens when the kidneys are damaged over a long period of time.  Most of the time, this condition does not go away, but it can usually be controlled. Steps must be taken to slow down the kidney damage or to stop it from getting worse.  Treatment may include a combination of medicines and lifestyle changes. This information is not intended to replace advice given to you by your health care provider. Make sure you discuss any questions you have with your health care provider. Document Released: 06/29/2009 Document Revised: 05/09/2016 Document Reviewed: 05/09/2016 Elsevier Interactive Patient Education  2019 [place] for Chronic Kidney Disease When your kidneys are not working well, they cannot remove waste and excess substances from your blood as effectively as they did before. This can lead to a buildup and imbalance of these substances, which can worsen kidney damage and affect how your body functions. Certain foods lead to a buildup of these substances in the body. By changing your diet as recommended by your diet and nutrition specialist (dietitian) or health care  provider, you could help prevent further kidney damage and delay or prevent the need for dialysis. What are tips for following this plan? General instructions   Work with your health care provider and dietitian to develop a meal plan that is right for you. Foods you can eat, limit, or avoid will be different for each person depending on the stage of kidney disease and any other existing health conditions.  Talk with your health care provider about whether you should take  a vitamin and mineral supplement.  Use standard measuring cups and spoons to measure servings of foods. Use a kitchen scale to measure portions of protein foods.  If directed by your health care provider, avoid drinking too much fluid. Measure and count all liquids, including water, ice, soups, flavored gelatin, and frozen desserts such as popsicles or ice cream. Reading food labels  Check the amount of sodium in foods. Choose foods that have less than 300 milligrams (mg) per serving.  Check the ingredient list for phosphorus or potassium-based additives or preservatives.  Check the amount of saturated and trans fat. Limit or avoid these fats as told by your dietitian. Shopping  Avoid buying foods that are: ? Processed, frozen, or prepackaged. ? Calcium-enriched or fortified.  Do not buy foods that have salt or sodium listed among the first five ingredients.  Do not buy canned vegetables. Cooking  Replace animal proteins, such as meat, fish, eggs, or dairy, with plant proteins from beans, nuts, and soy. ? Use soy milk instead of cow's milk. ? Add beans or tofu to soups, casseroles, or pasta dishes instead of meat.  Soak vegetables, such as potatoes, before cooking to reduce potassium. To do this: ? Peel and cut into small pieces. ? Soak in warm water for at least 2 hours. For every 1 cup of vegetables, use 10 cups of water. ? Drain and rinse with warm water. ? Boil for at least 5 minutes. Meal planning  Limit the amount of protein from plant and animal sources you eat each day.  Do not add salt to food when cooking or before eating.  Eat meals and snacks at around the same time each day. If you have diabetes:  If you have diabetes (diabetes mellitus) and chronic kidney disease, it is important to keep your blood glucose in the target range recommended by your health care provider. Follow your diabetes management plan. This may include: ? Checking your blood glucose  regularly. ? Taking oral medicines, insulin, or both. ? Exercising for at least 30 minutes on 5 or more days each week, or as told by your health care provider. ? Tracking how many servings of carbohydrates you eat at each meal.  You may be given specific guidelines on how much of certain foods and nutrients you may eat, depending on your stage of kidney disease and whether you have high blood pressure (hypertension). Follow your meal plan as told by your dietitian. What nutrients should be limited? The items listed are not a complete list. Talk with your dietitian about what dietary choices are best for you. Potassium Potassium affects how steadily your heart beats. If too much potassium builds up in your blood, it can cause an irregular heartbeat or even a heart attack. You may need to eat less potassium, depending on your blood potassium levels and the stage of kidney disease. Talk to your dietitian about how much potassium you may have each day. You may need to limit or avoid foods that are  high in potassium, such as:  Milk and soy milk.  Fruits, such as bananas, papaya, apricots, nectarines, melon, prunes, raisins, kiwi, and oranges.  Vegetables, such as potatoes, sweet potatoes, yams, tomatoes, leafy greens, beets, okra, avocado, pumpkin, and winter squash.  White and lima beans. Phosphorus Phosphorus is a mineral found in your bones. A balance between calcium and phosphorous is needed to build and maintain healthy bones. Too much phosphorus pulls calcium from your bones. This can make your bones weak and more likely to break. Too much phosphorus can also make your skin itch. You may need to eat less phosphorus depending on your blood phosphorus levels and the stage of kidney disease. Talk to your dietitian about how much potassium you may have each day. You may need to take medicine to lower your blood phosphorus levels if diet changes do not help. You may need to limit or avoid foods  that are high in phosphorus, such as:  Milk and dairy products.  Dried beans and peas.  Tofu, soy milk, and other soy-based meat replacements.  Colas.  Nuts and peanut butter.  Meat, poultry, and fish.  Bran cereals and oatmeals. Protein Protein helps you to make and keep muscle. It also helps in the repair of your bodys cells and tissues. One of the natural breakdown products of protein is a waste product called urea. When your kidneys are not working properly, they cannot remove wastes, such as urea, like they did before you developed chronic kidney disease. Reducing how much protein you eat can help prevent a buildup of urea in your blood. Depending on your stage of kidney disease, you may need to limit foods that are high in protein. Sources of animal protein include:  Meat (all types).  Fish and seafood.  Poultry.  Eggs.  Dairy. Other protein foods include:  Beans and legumes.  Nuts and nut butter.  Soy and tofu. Sodium Sodium, which is found in salt, helps maintain a healthy balance of fluids in your body. Too much sodium can increase your blood pressure and have a negative effect on the function of your heart and lungs. Too much sodium can also cause your body to retain too much fluid, making your kidneys work harder. Most people should have less than 2,300 milligrams (mg) of sodium each day. If you have hypertension, you may need to limit your sodium to 1,500 mg each day. Talk to your dietitian about how much sodium you may have each day. You may need to limit or avoid foods that are high in sodium, such as:  Salt seasonings.  Soy sauce.  Cured and processed meats.  Salted crackers and snack foods.  Fast food.  Canned soups and most canned foods.  Pickled foods.  Vegetable juice.  Boxed mixes or ready-to-eat boxed meals and side dishes.  Bottled dressings, sauces, and marinades. Summary  Chronic kidney disease can lead to a buildup and imbalance  of waste and excess substances in the body. Certain foods lead to a buildup of these substances. By adjusting your intake of these foods, you could help prevent more kidney damage and delay or prevent the need for dialysis.  Food adjustments are different for each person with chronic kidney disease. Work with a dietitian to set up nutrient goals and a meal plan that is right for you.  If you have diabetes and chronic kidney disease, it is important to keep your blood glucose in the target range recommended by your health care provider. This  information is not intended to replace advice given to you by your health care provider. Make sure you discuss any questions you have with your health care provider. Document Released: 06/25/2002 Document Revised: 03/30/2016 Document Reviewed: 03/30/2016 Elsevier Interactive Patient Education  2019 [place].   Gastrointestinal Bleeding  Gastrointestinal bleeding is bleeding somewhere along the path food travels through the body (digestive tract). This path is anywhere between the mouth and the opening of the butt (anus). You may have blood in your poop (stools) or have black poop. If you throw up (vomit), there may be blood in it. This condition can be mild, serious, or even life-threatening. If you have a lot of bleeding, you may need to stay in the hospital. Follow these instructions at home:  Take over-the-counter and prescription medicines only as told by your doctor.  Eat foods that have a lot of fiber in them. These foods include whole grains, fruits, and vegetables. You can also try eating 1-3 prunes each day.  Drink enough fluid to keep your pee (urine) clear or pale yellow.  Keep all follow-up visits as told by your doctor. This is important. Contact a doctor if:  Your symptoms do not get better. Get help right away if:  Your bleeding gets worse.  You feel dizzy or you pass out (faint).  You feel weak.  You have very bad cramps in  your back or belly (abdomen).  You pass large clumps of blood (clots) in your poop.  Your symptoms are getting worse. This information is not intended to replace advice given to you by your health care provider. Make sure you discuss any questions you have with your health care provider. Document Released: 01/12/2008 Document Revised: 09/10/2015 Document Reviewed: 09/22/2014 Elsevier Interactive Patient Education  2019 [employer].

## 2018-04-16 NOTE — Discharge Summary (Signed)
Discharge Summary  Billy [name] RSW:[id number] DOB: 1950-09-29  PCP: System, Pcp Not In  Admit date: 04/11/2018 Discharge date: 04/16/2018  Time spent: 35 minutes  Recommendations for Outpatient Follow-up:  1. Follow-up with your cardiologist 2. Follow-up with GI 3. Follow-up with PCP 4. Take your medications as prescribed  Discharge Diagnoses:  Active Hospital Problems   Diagnosis Date Noted  . Gastrointestinal hemorrhage with melena 04/11/2018  . AKI (acute kidney injury) ([facility]) 04/11/2018  . Acute blood loss anemia 04/11/2018  . Metabolic acidosis, normal anion gap (NAG) 04/11/2018  . Hypertension 12/12/2017  . Kidney disease, chronic, stage III (GFR 30-59 ml/min) ([facility]) 04/20/2017    Resolved Hospital Problems   Diagnosis Date Noted Date Resolved  . Hemorrhagic shock ([place]) 04/11/2018 04/11/2018    Discharge Condition: Stable  Diet recommendation: Resume previous diet  Vitals:   04/16/18 0349 04/16/18 0823  BP: (!) 167/99 (!) 156/96  Pulse: 70 61  Resp: 19   Temp: 98.3 F (36.8 C)   SpO2: 99%     History of present illness:  [name] a 67 y.o.malewith medical history significant for paroxysmalA.Fib recently started on eliquis last month, CKD stage 3, HTN, h/o DVT, peripheral neuropathy, presents with severe symptomatic anemia and NSTEMI.  Hospital course complicated by drop in hemoglobin, melena, and positive FOBT.  GI consulted.  EGD done on 04/13/2018 which revealed 1 nonbleeding duodenal ulcer with duodenitis, likely NSAID mediated.  GI recommended holding off Eliquis for 2 weeks if possible.  Bleeding appears to have stopped.  Diet was advanced and tolerated a solid diet well.  No abdominal pain, nausea or recurrent melena.  Last bowel movement was 04/15/2018 evening with normal consistency and normal color.  04/16/2018: Patient seen and examined at his bedside.  No acute events overnight.  He has no new complaints and wants to go home.  On the day  of discharge, the patient was hemodynamically stable.  He will need to follow-up with his primary care provider, cardiology, and GI posthospitalization.    Per cardiology follow-up has been arranged with Dr. Gwenlyn Found on 05/16/2017 at 1:30 pm.   Hospital Course:  Principal Problem:   Gastrointestinal hemorrhage with melena Active Problems:   Kidney disease, chronic, stage III (GFR 30-59 ml/min) ([facility])   Hypertension   AKI (acute kidney injury) ([place] [facility] [place])   Acute blood loss anemia   Metabolic acidosis, normal anion gap (NAG)  Acute blood loss anemia secondary to acute upper GI bleed - s/p 3 units of prbc transfusion  -EGD completed 04/13/2018 by Dr. [name] revealed 1 nonbleeding duodenal ulcer with duodenitis -Protonix changed to p.o. -Hemoglobin stable at 8.5 -Hold off Eliquis for 2 weeks per GI, Dr [name], recommendation  Nonbleeding duodenal ulcer/duodenitis Findings from EGD done 04/13/2018 by Dr. [name] as stated above Continue to hold Eliquis for another 10 to 14 days as recommended by GI Tolerating a solid diet well Continue Protonix 40 mg p.o. twice daily  Paroxysmal A. fib Maintaining normal sinus rhythm controlled rate Currently on Coreg 25 mg twice daily Hold Eliquis as requested by GI due to duodenal ulcer  NSTEMI in the setting of acute blood loss;  Symptomatic anemia vs CAD Troponin peaked at 17 Currently denies chest pain Cardiology followed and due to severe anemia secondary to GI bleed with ulcer and no endoscopic catheterization, medical management of this NSTEMI has been recommended for now Cath planned in the future once he can be on dual antiplatelets per cardiology Restart statin if normal LFTs. Continue  Imdur and Coreg as recommended by cardiology  Combined chronic systolic diastolic CHF Last 2D echo done on 19 revealed LVEF 45 to 50% with grade 2 diastolic dysfunction with some regional wall motion abnormalities. Follow-up with cardiology outpatient Cath  planned in the future once he can be on dual antiplatelets per cardiology Continue Coreg 25 mg twice daily and IMdur 15 mg daily  AKI on stage 3 CKD: Suspect multifactorial secondary to hypoperfusion from severe anemia and dehydration. Baseline creatinine appears to be 1.9.  With GFR of 35 Creatinine 2.01 Continue to avoid nephrotoxic agents Follow-up with your PCP posthospitalization  Peripheral neuropathy:  - On gabapentin.   Hypertension Blood pressures stable Currently on Coreg 25 mg twice daily, p.o. hydralazine 75 mg 3 times daily, and Imdur 15 mg daily Follow-up with cardiology within a week     Code Status:FULL CODE.   Consultants:  Cardiology Dr Ellyn Hack  Gastroenterology Dr [name].  Procedures: Echocardiogram.  Antimicrobials:None.     Discharge Exam: BP (!) 156/96   Pulse 61   Temp 98.3 F (36.8 C) (Oral)   Resp 19   Ht 6\' 2"  (1.88 m)   Wt 78 kg   SpO2 99%   BMI 22.07 kg/m  . General: 67 y.o. year-old male well developed well nourished in no acute distress.  Alert and oriented x3. . Cardiovascular: Regular rate and rhythm with no rubs or gallops.  No thyromegaly or JVD noted.   Marland Kitchen Respiratory: Clear to auscultation with no wheezes or rales. Good inspiratory effort. . Abdomen: Soft nontender nondistended with normal bowel sounds x4 quadrants. . Musculoskeletal: No lower extremity edema. 2/4 pulses in all 4 extremities. . Skin: No ulcerative lesions noted or rashes, . Psychiatry: Mood is appropriate for condition and setting  Discharge Instructions You were cared for by a hospitalist during your hospital stay. If you have any questions about your discharge medications or the care you received while you were in the hospital after you are discharged, you can call the unit and asked to speak with the hospitalist on call if the hospitalist that took care of you is not available. Once you are discharged, your primary care physician will  handle any further medical issues. Please note that NO REFILLS for any discharge medications will be authorized once you are discharged, as it is imperative that you return to your primary care physician (or establish a relationship with a primary care physician if you do not have one) for your aftercare needs so that they can reassess your need for medications and monitor your lab values.   Allergies as of 04/16/2018      Reactions   Morphine And Related Other (See Comments)   "Can not handle it"      Medication List    STOP taking these medications   apixaban 5 MG Tabs tablet Commonly known as:  ELIQUIS   ibuprofen 200 MG tablet Commonly known as:  ADVIL,MOTRIN   spironolactone 25 MG tablet Commonly known as:  ALDACTONE     TAKE these medications   atorvastatin 80 MG tablet Commonly known as:  LIPITOR Take 1 tablet (80 mg total) by mouth daily at 6 PM.   carvedilol 25 MG tablet Commonly known as:  COREG Take 0.5-1 tablets (12.5-25 mg total) by mouth 2 (two) times daily with a meal.   gabapentin 100 MG capsule Commonly known as:  NEURONTIN Take 2 capsules (200 mg total) by mouth at bedtime.   hydrALAZINE 25 MG tablet Commonly  known as:  APRESOLINE Take 3 tablets (75 mg total) by mouth every 8 (eight) hours. What changed:    medication strength  how much to take  when to take this   isosorbide mononitrate 30 MG 24 hr tablet Commonly known as:  IMDUR Take 0.5 tablets (15 mg total) by mouth daily. Start taking on:  April 17, 2018   pantoprazole 40 MG tablet Commonly known as:  PROTONIX Take 1 tablet (40 mg total) by mouth 2 (two) times daily before a meal.      Allergies  Allergen Reactions  . Morphine And Related Other (See Comments)    "Can not handle it"   Follow-up Information    Lorretta Harp, MD. Go on 05/16/2018.   Specialties:  Cardiology, Radiology Why:  @1 :30pm for hospital follow up  Contact information: [address] [place] [address] [phone]        Arta Silence, MD. Call in 1 day(s).   Specialty:  Gastroenterology Why:  Please call for a post hospital follow-up appointment. Contact information: [address] [address] [phone]        [place]. Call in 1 day(s).   Why:  Please call for a post hospital follow-up appointment. Contact information: [address] [place] [address] [phone]           The results of significant diagnostics from this hospitalization (including imaging, microbiology, ancillary and laboratory) are listed below for reference.    Significant Diagnostic Studies: US Renal  Result Date: 04/13/2018 CLINICAL DATA:  Acute onset of renal insufficiency. EXAM: RENAL / URINARY TRACT ULTRASOUND COMPLETE COMPARISON:  Renal ultrasound performed 07/27/2017 FINDINGS: Right Kidney: Renal measurements: 11.3 x 4.8 x 5.1 cm = volume: 148.7 mL . Echogenicity within normal limits. No mass or hydronephrosis visualized. Left Kidney: Renal measurements: 7.1 x 4.2 x 3.8 cm = volume: 60.5 mL. Echogenicity within normal limits. No mass or hydronephrosis visualized. Bladder: Appears normal for degree of bladder distention. Small bilateral pleural effusions are noted. Mild ascites is noted within the abdomen and pelvis. IMPRESSION: 1. No evidence of hydronephrosis. 2. Left renal atrophy noted. 3. Small bilateral pleural effusions. 4. Mild ascites within the abdomen and pelvis. Electronically Signed   By: Garald Balding M.D.   On: 04/13/2018 05:30   Dg Chest Port 1 View  Result Date: 04/11/2018 CLINICAL DATA:  Feeling unwell, hallucinations, hypotensive. History of hypertension. EXAM: PORTABLE CHEST 1 VIEW COMPARISON:  CT chest June 29, 2017 FINDINGS: Cardiac silhouette is mildly enlarged. Mediastinal silhouette is not suspicious. Loop monitor projects LEFT chest. No pleural effusion or  focal consolidation. No pneumothorax. Soft tissue planes and included osseous structures are non suspicious. IMPRESSION: Mild cardiomegaly.  No acute pulmonary process. Electronically Signed   By: Elon Alas M.D.   On: 04/11/2018 04:19    Microbiology: No results found for this or any previous visit (from the past 240 hour(s)).   Labs: Basic Metabolic Panel: Recent Labs  Lab 04/11/18 1833 04/12/18 1029 04/13/18 0421 04/14/18 0338 04/15/18 0459 04/16/18 0346  NA  --  143 142 145 143 142  K 4.2 3.8 3.8 4.3 3.9 4.0  CL  --  118* 115* 117* 115* 114*  CO2  --  17* 17* 20* 19* 21*  GLUCOSE  --  86 99 112* 92 98  BUN  --  40* 37* 30* 23 21  CREATININE  --  2.34* 2.25*  2.23* 1.97* 2.01*  CALCIUM  --  8.3* 8.4* 8.2* 8.3* 8.3*  MG 2.4  --   --   --   --   --    Liver Function Tests: Recent Labs  Lab 04/11/18 0314 04/14/18 0338  AST 81* 33  ALT 48* 45*  ALKPHOS 63 63  BILITOT 0.3 0.2*  PROT 4.5* 4.5*  ALBUMIN 2.6* 2.5*   Recent Labs  Lab 04/11/18 0314  LIPASE 48   No results for input(s): AMMONIA in the last 168 hours. CBC: Recent Labs  Lab 04/11/18 0314  04/12/18 0910 04/13/18 0421 04/14/18 0338 04/15/18 0459 04/16/18 0346  WBC 7.9  --   --  8.8 8.0 7.6 7.7  NEUTROABS 5.7  --   --   --   --   --   --   HGB 5.0*   < > 9.3* 8.6* 8.4* 8.5* 8.7*  HCT 17.6*   < > 29.7* 28.3* 27.3* 28.2* 28.9*  MCV 88.0  --   --  86.0 84.8 84.2 83.5  PLT 153  --   --  186 173 182 202   < > = values in this interval not displayed.   Cardiac Enzymes: Recent Labs  Lab 04/11/18 0415 04/11/18 1226 04/11/18 1833  TROPONINI 1.57* 8.55* 17.23*   BNP: BNP (last 3 results) No results for input(s): BNP in the last 8760 hours.  ProBNP (last 3 results) No results for input(s): PROBNP in the last 8760 hours.  CBG: No results for input(s): GLUCAP in the last 168 hours.     Signed:  Kayleen Memos, MD [facility] 04/16/2018, 9:31 AM

## 2018-04-16 NOTE — Progress Notes (Addendum)
Progress Note  Patient Name: Billy [name] Date of Encounter: 04/16/2018  Primary Cardiologist: Quay Burow, MD   Subjective   Feeling well. No chest pain, sob or palpitations.   Inpatient Medications    Scheduled Meds: . carvedilol  25 mg Oral BID WC  . gabapentin  200 mg Oral QHS  . hydrALAZINE  50 mg Oral Q8H  . isosorbide mononitrate  15 mg Oral Daily  . pantoprazole  40 mg Oral BID AC   Continuous Infusions:  PRN Meds: acetaminophen **OR** acetaminophen, hydrALAZINE, ondansetron **OR** ondansetron (ZOFRAN) IV   Vital Signs    Vitals:   04/15/18 1806 04/15/18 1940 04/16/18 0349 04/16/18 0823  BP: (!) 160/87 135/64 (!) 167/99 (!) 156/96  Pulse:  67 70 61  Resp: 15 17 19    Temp:  98.4 F (36.9 C) 98.3 F (36.8 C)   TempSrc:  Oral Oral   SpO2:  97% 99%   Weight:   78 kg   Height:        Intake/Output Summary (Last 24 hours) at 04/16/2018 0839 Last data filed at 04/16/2018 0721 Gross per 24 hour  Intake 360 ml  Output 700 ml  Net -340 ml   Filed Weights   04/14/18 0518 04/15/18 0506 04/16/18 0349  Weight: 76.5 kg 77.1 kg 78 kg    Telemetry    Sinus rhythm with frequent PAC and PVC- Personally Reviewed  ECG    None today  Physical Exam   GEN: No acute distress.   Neck: No JVD Cardiac: RRR, no murmurs, rubs, or gallops.  Respiratory: Clear to auscultation bilaterally. GI: Soft, nontender, non-distended  MS: No edema; No deformity. Neuro:  Nonfocal  Psych: Normal affect   Labs    Chemistry Recent Labs  Lab 04/11/18 0314  04/14/18 0338 04/15/18 0459 04/16/18 0346  NA 142   < > 145 143 142  K 4.0   < > 4.3 3.9 4.0  CL 115*   < > 117* 115* 114*  CO2 14*   < > 20* 19* 21*  GLUCOSE 118*   < > 112* 92 98  BUN 58*   < > 30* 23 21  CREATININE 2.94*   < > 2.23* 1.97* 2.01*  CALCIUM 7.8*   < > 8.2* 8.3* 8.3*  PROT 4.5*  --  4.5*  --   --   ALBUMIN 2.6*  --  2.5*  --   --   AST 81*  --  33  --   --   ALT 48*  --  45*  --   --     ALKPHOS 63  --  63  --   --   BILITOT 0.3  --  0.2*  --   --   GFRNONAA 21*   < > 30* 35* 34*  GFRAA 25*   < > 35* 40* 39*  ANIONGAP 13   < > 8 9 7    < > = values in this interval not displayed.     Hematology Recent Labs  Lab 04/14/18 0338 04/15/18 0459 04/16/18 0346  WBC 8.0 7.6 7.7  RBC 3.22* 3.35* 3.46*  HGB 8.4* 8.5* 8.7*  HCT 27.3* 28.2* 28.9*  MCV 84.8 84.2 83.5  MCH 26.1 25.4* 25.1*  MCHC 30.8 30.1 30.1  RDW 15.9* 15.5 15.2  PLT 173 182 202    Cardiac Enzymes Recent Labs  Lab 04/11/18 0415 04/11/18 1226 04/11/18 1833  TROPONINI 1.57* 8.55* 17.23*    Recent Labs  Lab 04/11/18 0313  TROPIPOC 1.97*     Radiology    No results found.  Cardiac Studies   Echo 04/13/18 - Left ventricle: The cavity size was mildly dilated. Wall thickness was increased in a pattern of moderate LVH. Systolic function was mildly reduced. The estimated ejection fraction was in the range of 45% to 50%. Features are consistent with a pseudonormal left ventricular filling pattern, with concomitant abnormal relaxation and increased filling pressure (grade 2 diastolic dysfunction). Doppler parameters are consistent with high ventricular filling pressure. - Regional wall motion abnormality: Akinesis of the basal-mid inferior myocardium; severe hypokinesis of the mid inferoseptal myocardium; moderate hypokinesis of the basal inferoseptal, apical inferior, mid inferolateral, and apical septal myocardium. - Aortic valve: Sclerosis without stenosis. There was no regurgitation. Mean gradient (S): 3 mm Hg. - Mitral valve: Transvalvular velocity was within the normal range. There was no evidence for stenosis. There was mild regurgitation, posteriorly directed. Mild anterior leaflet override likely secondary to posterior leaflet tethering. - Left atrium: The atrium was mildly dilated. - Right ventricle: The cavity size was normal. Wall thickness  was normal. Systolic function was normal. - Right atrium: The atrium was mildly dilated. Central venous pressure (est): 15 mm Hg. - Atrial septum: No defect or patent foramen ovale was identified. - Tricuspid valve: There was trivial regurgitation. - Inferior vena cava: The vessel was dilated. The respirophasic diameter changes were blunted (<50%), consistent with elevated central venous pressure. - Pericardium, extracardiac: A trivial pericardial effusion was identified.  Impressions:  - Compared to the prior TTE exam of 12/12/2017, inferior and inferoseptal wall motion abnormalities have worsened, EF has decreased. Mitral regurgitation has increased. Side by side comparison of images performed.  Moderate concentric LVH with valvular thickening. Cannot exclude cardiac amyloidosis.   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 NSTEMI in setting of acute blood loss - Peak Trop 17.23 - Echo shows no regional wall motion abnormalities likely due to severe anemia. -Due to severe anemia secondary to GI bleed with ulcer unamenable to endoscopic cauterization, medical management of his non-STEMI has been recommended for now with cath planned in the future once he can be on DAPT.  GI recommends waiting 1 month. -restart statin if normal LFTs (pending lab today).  Was on Lipitor 80 mg daily prior to this. -Continue beta-blocker and Imdur 15 mg daily  2. Paroxysmal atrial fibrillation -He has been maintaining normal sinus rhythm at controlled rate with frequent PVCs and PACS -Continue carvedilol 25 mg twice daily -DOAC on hold due to GI bleed -Per Dr. [name] 12/29 " Hold apixaban  another 10-14 days if possible"  3. NSVT - BB is at max dose - no sx from bradycardia - No further VT on monitor  4. Acute GI bleed with anemia - s/p EGD w/ duodenitis and one non-bleeding duodenal ulcer noted. - per Dr [name], "Ifeel ulcer is at moderate (30-40%) risk of  significant bleeding with anticoagulation; and, if  bleeding were to occur, this would probably require  IR consultation for angiogram, as the ulcer is in  very odd location and under lots of edema and not  readily treatable endoscopically." -  Hemoglobin remained stable 8.7  5. Essential Hypertension  -BP remains elevated. Hx of edema on Norvasc.  - on Coreg 25 mg bid. Increase hydralazine to 75mg  TID.   6. Acute on CKD stage III - BUN/Cr 58/2.94 on admit, trending down (2.01 today)  Follow-up has been arranged with Dr. Gwenlyn Found 05/16/2017.   For questions or updates, please contact [place] Please consult [email or web address] for contact info under        SignedLeanor Kail, PA  04/16/2018, 8:39 AM     Patient seen and examined. Agree with assessment and plan. Medical therapy for NSTEMI in setting of acute GI bleep with plan for future cath. Titrate imdur to 30 mg as BP allows. No present chest pain.    Troy Sine, MD, [facility] 04/16/2018 12:09 PM

## 2018-04-16 NOTE — Care Management Important Message (Signed)
Important Message  Patient Details  Name: Billy [name] MRN: [id number] Date of Birth: 04/03/1951   Medicare Important Message Given:  Yes    Barb Merino Kaeden Mester 04/16/2018, 3:28 PM

## 2018-04-17 ENCOUNTER — Ambulatory Visit (HOSPITAL_COMMUNITY): Payer: Self-pay | Admitting: Nurse Practitioner

## 2018-04-23 ENCOUNTER — Ambulatory Visit (INDEPENDENT_AMBULATORY_CARE_PROVIDER_SITE_OTHER): Payer: PPO | Admitting: Surgery

## 2018-04-23 ENCOUNTER — Encounter: Payer: Self-pay | Admitting: Surgery

## 2018-04-23 ENCOUNTER — Other Ambulatory Visit: Payer: Self-pay

## 2018-04-23 VITALS — BP 123/60 | HR 62 | Temp 97.0°F | Resp 20 | Ht 74.0 in | Wt 171.0 lb

## 2018-04-23 DIAGNOSIS — I6523 Occlusion and stenosis of bilateral carotid arteries: Secondary | ICD-10-CM
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
## 2018-04-24 DIAGNOSIS — G2581 Restless legs syndrome: Secondary | ICD-10-CM | POA: Diagnosis not present

## 2018-04-24 DIAGNOSIS — R202 Paresthesia of skin: Secondary | ICD-10-CM | POA: Diagnosis not present

## 2018-04-24 DIAGNOSIS — I10 Essential (primary) hypertension: Secondary | ICD-10-CM | POA: Diagnosis not present

## 2018-04-24 DIAGNOSIS — M25551 Pain in right hip: Secondary | ICD-10-CM | POA: Diagnosis not present

## 2018-04-25 ENCOUNTER — Ambulatory Visit (INDEPENDENT_AMBULATORY_CARE_PROVIDER_SITE_OTHER): Payer: PPO

## 2018-04-25 DIAGNOSIS — I6340 Cerebral infarction due to embolism of unspecified cerebral artery: Secondary | ICD-10-CM | POA: Diagnosis not present

## 2018-04-26 LAB — CUP PACEART REMOTE DEVICE CHECK
Date Time Interrogation Session: 20200108211044
Implantable Pulse Generator Implant Date: 20190829

## 2018-04-26 NOTE — Progress Notes (Signed)
Carelink Summary Report / Loop Recorder 

## 2018-04-27 DIAGNOSIS — N183 Chronic kidney disease, stage 3 (moderate): Secondary | ICD-10-CM | POA: Diagnosis not present

## 2018-04-27 DIAGNOSIS — I10 Essential (primary) hypertension: Secondary | ICD-10-CM | POA: Diagnosis not present

## 2018-04-27 DIAGNOSIS — Z8719 Personal history of other diseases of the digestive system: Secondary | ICD-10-CM | POA: Diagnosis not present

## 2018-04-27 DIAGNOSIS — N280 Ischemia and infarction of kidney: Secondary | ICD-10-CM | POA: Diagnosis not present

## 2018-04-27 DIAGNOSIS — G6289 Other specified polyneuropathies: Secondary | ICD-10-CM | POA: Diagnosis not present

## 2018-04-27 DIAGNOSIS — I5189 Other ill-defined heart diseases: Secondary | ICD-10-CM | POA: Diagnosis not present

## 2018-04-27 DIAGNOSIS — E7800 Pure hypercholesterolemia, unspecified: Secondary | ICD-10-CM | POA: Diagnosis not present

## 2018-04-27 DIAGNOSIS — I6930 Unspecified sequelae of cerebral infarction: Secondary | ICD-10-CM | POA: Diagnosis not present

## 2018-04-27 DIAGNOSIS — K921 Melena: Secondary | ICD-10-CM | POA: Diagnosis not present

## 2018-04-27 DIAGNOSIS — D649 Anemia, unspecified: Secondary | ICD-10-CM | POA: Diagnosis not present

## 2018-04-27 DIAGNOSIS — I6523 Occlusion and stenosis of bilateral carotid arteries: Secondary | ICD-10-CM | POA: Diagnosis not present

## 2018-04-27 DIAGNOSIS — G2581 Restless legs syndrome: Secondary | ICD-10-CM | POA: Diagnosis not present

## 2018-04-27 DIAGNOSIS — I252 Old myocardial infarction: Secondary | ICD-10-CM | POA: Diagnosis not present

## 2018-05-02 ENCOUNTER — Other Ambulatory Visit (HOSPITAL_COMMUNITY): Payer: Self-pay | Admitting: Internal Medicine

## 2018-05-02 DIAGNOSIS — G459 Transient cerebral ischemic attack, unspecified: Secondary | ICD-10-CM | POA: Diagnosis not present

## 2018-05-02 DIAGNOSIS — I701 Atherosclerosis of renal artery: Secondary | ICD-10-CM | POA: Diagnosis not present

## 2018-05-02 DIAGNOSIS — I4891 Unspecified atrial fibrillation: Secondary | ICD-10-CM | POA: Diagnosis not present

## 2018-05-02 DIAGNOSIS — D631 Anemia in chronic kidney disease: Secondary | ICD-10-CM | POA: Diagnosis not present

## 2018-05-02 DIAGNOSIS — I5022 Chronic systolic (congestive) heart failure: Secondary | ICD-10-CM | POA: Diagnosis not present

## 2018-05-02 DIAGNOSIS — I129 Hypertensive chronic kidney disease with stage 1 through stage 4 chronic kidney disease, or unspecified chronic kidney disease: Secondary | ICD-10-CM | POA: Diagnosis not present

## 2018-05-02 DIAGNOSIS — N189 Chronic kidney disease, unspecified: Secondary | ICD-10-CM | POA: Diagnosis not present

## 2018-05-02 DIAGNOSIS — N183 Chronic kidney disease, stage 3 (moderate): Secondary | ICD-10-CM | POA: Diagnosis not present

## 2018-05-06 LAB — CUP PACEART REMOTE DEVICE CHECK
Date Time Interrogation Session: 20191206211000
Implantable Pulse Generator Implant Date: 20190829

## 2018-05-11 ENCOUNTER — Other Ambulatory Visit (HOSPITAL_COMMUNITY): Payer: Self-pay | Admitting: Internal Medicine

## 2018-05-16 ENCOUNTER — Ambulatory Visit: Payer: PPO | Admitting: Cardiovascular Disease

## 2018-05-25 ENCOUNTER — Ambulatory Visit: Payer: BLUE CROSS/BLUE SHIELD | Admitting: Neurology

## 2018-05-25 ENCOUNTER — Telehealth: Payer: Self-pay | Admitting: Neurology

## 2018-05-25 NOTE — Telephone Encounter (Signed)
This patient canceled the same day of a revisit appointment. 

## 2018-05-28 ENCOUNTER — Encounter: Payer: Self-pay | Admitting: Neurology

## 2018-05-28 ENCOUNTER — Ambulatory Visit (INDEPENDENT_AMBULATORY_CARE_PROVIDER_SITE_OTHER): Payer: PPO

## 2018-05-28 DIAGNOSIS — I6340 Cerebral infarction due to embolism of unspecified cerebral artery: Secondary | ICD-10-CM | POA: Diagnosis not present

## 2018-05-29 LAB — CUP PACEART REMOTE DEVICE CHECK
Date Time Interrogation Session: 20200210211054
Implantable Pulse Generator Implant Date: 20190829

## 2018-06-06 ENCOUNTER — Other Ambulatory Visit: Payer: Self-pay | Admitting: Cardiovascular Disease

## 2018-06-06 MED ORDER — ISOSORBIDE MONONITRATE ER 30 MG PO TB24: 15.0000 mg | ORAL_TABLET | Freq: Every day | ORAL | 1 refills | Status: DC

## 2018-06-06 NOTE — Telephone Encounter (Signed)
New Message    *STAT* If patient is at the pharmacy, call can be transferred to refill team.   1. Which medications need to be refilled? (please list name of each medication and dose if known) isosorbide mononitrate (IMDUR) 30 MG 24 hr tablet    2. Which pharmacy/location (including street and city if local pharmacy) is medication to be sent to? [id number] - [place], [place] - [address] AT [address]  3. Do they need a 30 day or 90 day supply? [place]

## 2018-06-06 NOTE — Telephone Encounter (Signed)
Rx(s) sent to pharmacy electronically.  

## 2018-06-08 NOTE — Progress Notes (Signed)
Carelink Summary Report / Loop Recorder 

## 2018-06-13 DIAGNOSIS — I129 Hypertensive chronic kidney disease with stage 1 through stage 4 chronic kidney disease, or unspecified chronic kidney disease: Secondary | ICD-10-CM | POA: Diagnosis not present

## 2018-06-13 DIAGNOSIS — I4891 Unspecified atrial fibrillation: Secondary | ICD-10-CM | POA: Diagnosis not present

## 2018-06-13 DIAGNOSIS — I5022 Chronic systolic (congestive) heart failure: Secondary | ICD-10-CM | POA: Diagnosis not present

## 2018-06-13 DIAGNOSIS — D631 Anemia in chronic kidney disease: Secondary | ICD-10-CM | POA: Diagnosis not present

## 2018-06-13 DIAGNOSIS — N183 Chronic kidney disease, stage 3 (moderate): Secondary | ICD-10-CM | POA: Diagnosis not present

## 2018-06-13 DIAGNOSIS — K922 Gastrointestinal hemorrhage, unspecified: Secondary | ICD-10-CM | POA: Diagnosis not present

## 2018-06-13 DIAGNOSIS — N189 Chronic kidney disease, unspecified: Secondary | ICD-10-CM | POA: Diagnosis not present

## 2018-06-15 ENCOUNTER — Encounter: Payer: Self-pay | Admitting: Cardiovascular Disease

## 2018-06-15 ENCOUNTER — Ambulatory Visit (INDEPENDENT_AMBULATORY_CARE_PROVIDER_SITE_OTHER): Payer: PPO | Admitting: Cardiovascular Disease

## 2018-06-15 DIAGNOSIS — I10 Essential (primary) hypertension: Secondary | ICD-10-CM | POA: Diagnosis not present

## 2018-06-15 DIAGNOSIS — I6523 Occlusion and stenosis of bilateral carotid arteries: Secondary | ICD-10-CM | POA: Diagnosis not present

## 2018-06-15 DIAGNOSIS — E782 Mixed hyperlipidemia: Secondary | ICD-10-CM

## 2018-06-15 DIAGNOSIS — I214 Non-ST elevation (NSTEMI) myocardial infarction: Secondary | ICD-10-CM

## 2018-06-15 DIAGNOSIS — I6340 Cerebral infarction due to embolism of unspecified cerebral artery: Secondary | ICD-10-CM

## 2018-06-15 DIAGNOSIS — I252 Old myocardial infarction: Secondary | ICD-10-CM | POA: Insufficient documentation

## 2018-06-15 MED ORDER — APIXABAN 5 MG PO TABS: 5.0000 mg | ORAL_TABLET | Freq: Two times a day (BID) | ORAL | 3 refills | Status: DC

## 2018-06-15 NOTE — Assessment & Plan Note (Signed)
History of recent non-STEMI during an admission in December for GI bleed with troponins went up to the 17 range.  It was elected not to cath him because of his GI bleed and moderate renal insufficiency.  I am going to get a pharmacologic Myoview stress test to further evaluate

## 2018-06-15 NOTE — Assessment & Plan Note (Signed)
History of moderate bilateral ICA stenosis by duplex ultrasound 12/12/2017.  We will repeat this this coming August.  He has been evaluated by Dr. Trula Slade because of his prior stroke however he did not feel this was related to his carotid disease but more thromboembolic from his PAF.

## 2018-06-15 NOTE — Assessment & Plan Note (Signed)
History of essential hypertension with blood pressure measured today at 160/78.  He is on carvedilol and hydralazine.

## 2018-06-15 NOTE — Progress Notes (Signed)
06/15/2018 Billy [name]   1950-10-13  [id number]  Primary Physician System, Pcp Not In Primary Cardiologist: Lorretta Harp MD Lupe Carney, [place]  HPI:  Billy [name] is a 68 y.o.  thin appearing divorced Caucasian male father to, grandfather of one grandchild who is retired from [employer] and [employer] and was referred by Dr. Aundra Dubin to be established in my practice for ongoing cardiovascular care.  He lives with his elderly mother and takes care of her.  He has a history of treated hypertension hyperlipidemia.  He is never smoked.  He is never had a heart attack.  There is no family history.  He denies chest pain or shortness of breath.  He has had an echo that shows normal LV function with inferior akinesia.  He did have a TIA/stroke and had a TEE for embolic source which was negative and had a Linq device implanted by Dr. Rayann Heman to rule out PAF as a etiology.  His Linq recorder did show episodes of PAF for which she was begun on Eliquis oral anticoagulation.  He was admitted in late December for GI bleed and hypotension.  He did undergo endoscopy by Dr. [name] apparently finding a gastric ulcer.  His Eliquis was placed on hold.  He also had a non-STEMI with a peak troponin of 17 although he denies chest pain.  It was elected not to perform cardiac catheterization because of his GI bleed and moderate renal insufficiency with a known occluded left renal artery.  He was also evaluated by Dr. Trula Slade for his carotid disease which were evaluated by duplex ultrasound in August revealing moderate bilateral ICA stenosis thought not to be related to his stroke.   Current Meds  Medication Sig  . carvedilol (COREG) 25 MG tablet Take 0.5-1 tablets (12.5-25 mg total) by mouth 2 (two) times daily with a meal.  . gabapentin (NEURONTIN) 100 MG capsule Take 2 capsules (200 mg total) by mouth at bedtime.  . hydrALAZINE (APRESOLINE) 100 MG tablet Take 100 mg by mouth 3 (three) times daily.  . isosorbide  mononitrate (IMDUR) 30 MG 24 hr tablet Take 0.5 tablets (15 mg total) by mouth daily.  . pantoprazole (PROTONIX) 40 MG tablet Take 1 tablet (40 mg total) by mouth 2 (two) times daily before a meal.  . pramipexole (MIRAPEX) 0.125 MG tablet Take 1 tablet by mouth at bedtime.     Allergies  Allergen Reactions  . Morphine And Related Other (See Comments)    "Can not handle it"    Social History   Socioeconomic History  . Marital status: Divorced    Spouse name: Not on file  . Number of children: 2  . Years of education: 63  . Highest education level: Not on file  Occupational History  . 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  Social Needs  . Financial resource strain: Not on file  . Food insecurity:    Worry: Not on file    Inability: Not on file  . Transportation needs:    Medical: Not on file    Non-medical: Not on file  Tobacco Use  . Smoking status: Never Smoker  . Smokeless tobacco: Never Used  Substance and Sexual Activity  . Alcohol use: No  . Drug use: Never  . Sexual activity: Not Currently  Lifestyle  . Physical activity:    Days per week: Not on file  Minutes per session: Not on file  . Stress: Not on file  Relationships  . Social connections:    Talks on phone: Not on file    Gets together: Not on file    Attends religious service: Not on file    Active member of club or organization: Not on file    Attends meetings of clubs or organizations: Not on file    Relationship status: Not on file  . Intimate partner violence:    Fear of current or ex partner: Not on file    Emotionally abused: Not on file    Physically abused: Not on file    Forced sexual activity: Not on file  Other Topics Concern  . Not on file  Social History Narrative   Raised in [facility]   Caffeine use: coke sometimes   Lives with mother and cares for her currently.     Review of  Systems: General: negative for chills, fever, night sweats or weight changes.  Cardiovascular: negative for chest pain, dyspnea on exertion, edema, orthopnea, palpitations, paroxysmal nocturnal dyspnea or shortness of breath Dermatological: negative for rash Respiratory: negative for cough or wheezing Urologic: negative for hematuria Abdominal: negative for nausea, vomiting, diarrhea, bright red blood per rectum, melena, or hematemesis Neurologic: negative for visual changes, syncope, or dizziness All other systems reviewed and are otherwise negative except as noted above.    Blood pressure (!) 160/78, pulse 70, height 6\' 2"  (1.88 m), weight 174 lb 4.8 oz (79.1 kg), SpO2 98 %.  General appearance: alert and no distress Neck: no adenopathy, no JVD, supple, symmetrical, trachea midline, thyroid not enlarged, symmetric, no tenderness/mass/nodules and Bilateral carotid bruits Lungs: clear to auscultation bilaterally Heart: regular rate and rhythm, S1, S2 normal, no murmur, click, rub or gallop Extremities: extremities normal, atraumatic, no cyanosis or edema Pulses: 2+ and symmetric Skin: Skin color, texture, turgor normal. No rashes or lesions Neurologic: Alert and oriented X 3, normal strength and tone. Normal symmetric reflexes. Normal coordination and gait  EKG not performed today  ASSESSMENT AND PLAN:   Hypertension History of essential hypertension with blood pressure measured today at 160/78.  He is on carvedilol and hydralazine.  Cerebral embolism with cerebral infarction History of embolic stroke probably related to silent A. fib found on a Linq monitor currently off of Eliquis because of a GI bleed back in December evaluated endoscopically by Dr. [name] who recommended going back on Eliquis 10 to 14 days afterwards.  He currently is not on Eliquis which we will restart.  He has not noticed any melena in his stools.  Carotid artery disease ([place]) History of moderate bilateral ICA  stenosis by duplex ultrasound 12/12/2017.  We will repeat this this coming August.  He has been evaluated by Dr. Trula Slade because of his prior stroke however he did not feel this was related to his carotid disease but more thromboembolic from his PAF.  Hyperlipidemia History of hyperlipidemia on statin therapy with lipid profile performed 04/27/2018 revealing total cholesterol 129, LDL 62 and HDL 55  Non-STEMI (non-ST elevated myocardial infarction) ([place]) History of recent non-STEMI during an admission in December for GI bleed with troponins went up to the 17 range.  It was elected not to cath him because of his GI bleed and moderate renal insufficiency.  I am going to get a pharmacologic Myoview stress test to further evaluate      Lorretta Harp MD [facility], [facility] 06/15/2018 11:24 AM

## 2018-06-15 NOTE — Assessment & Plan Note (Signed)
History of embolic stroke probably related to silent A. fib found on a Linq monitor currently off of Eliquis because of a GI bleed back in December evaluated endoscopically by Dr. [name] who recommended going back on Eliquis 10 to 14 days afterwards.  He currently is not on Eliquis which we will restart.  He has not noticed any melena in his stools.

## 2018-06-15 NOTE — Assessment & Plan Note (Signed)
History of hyperlipidemia on statin therapy with lipid profile performed 04/27/2018 revealing total cholesterol 129, LDL 62 and HDL 55

## 2018-06-15 NOTE — Patient Instructions (Addendum)
Medication Instructions:  Your physician has recommended you make the following change in your medication:  START ELIQUIS 5 MG, ONE TABLET TWICE A DAY  If you need a refill on your cardiac medications before your next appointment, please call your pharmacy.   Lab work: none If you have labs (blood work) drawn today and your tests are completely normal, you will receive your results only by: Marland Kitchen MyChart Message (if you have MyChart) OR . A paper copy in the mail If you have any lab test that is abnormal or we need to change your treatment, we will call you to review the results.  Testing/Procedures: Your physician has requested that you have a carotid duplex. This test is an ultrasound of the carotid arteries in your neck. It looks at blood flow through these arteries that supply the brain with blood. Allow one hour for this exam. There are no restrictions or special instructions.  SCHEDULE AUGUST 2020  Your physician has requested that you have a lexiscan myoview. For further information please visit [email or web address]. Please follow instruction sheet, as given.  SCHEDULE NOW  Follow-Up: At [facility], you and your health needs are our priority.  As part of our continuing mission to provide you with exceptional heart care, we have created designated Provider Care Teams.  These Care Teams include your primary Cardiologist (physician) and Advanced Practice Providers (APPs -  Physician Assistants and Nurse Practitioners) who all work together to provide you with the care you need, when you need it. . You will need a follow up appointment in 3 months with an APP and in 6 months with Dr. Gwenlyn Found.  Please call our office 2 months in advance to schedule this appointment.  You may see Dr. Gwenlyn Found or one of the following Advanced Practice Providers on your designated Care Team:   . Kerin Ransom, [place] . Almyra Deforest, PA-C . Fabian Sharp, PA-C . Jory Sims, DNP . Rosaria Ferries, PA-C . Roby Lofts, PA-C . Sande Rives, PA-C

## 2018-06-21 DIAGNOSIS — H02834 Dermatochalasis of left upper eyelid: Secondary | ICD-10-CM | POA: Diagnosis not present

## 2018-06-27 ENCOUNTER — Other Ambulatory Visit: Payer: Self-pay | Admitting: Cardiovascular Disease

## 2018-06-27 MED ORDER — ATORVASTATIN CALCIUM 80 MG PO TABS: 80.0000 mg | ORAL_TABLET | Freq: Every day | ORAL | 5 refills | Status: DC

## 2018-06-27 NOTE — Telephone Encounter (Signed)
New Message    *STAT* If patient is at the pharmacy, call can be transferred to refill team.   1. Which medications need to be refilled? (please list name of each medication and dose if known) Atorvastatin 80mg    2. Which pharmacy/location (including street and city if local pharmacy) is medication to be sent to? [facility] [address]   3. Do they need a 30 day or 90 day supply? 90 day supply    Patient out of medication right now.

## 2018-06-29 ENCOUNTER — Telehealth (HOSPITAL_COMMUNITY): Payer: Self-pay

## 2018-06-29 NOTE — Telephone Encounter (Signed)
Encounter complete. 

## 2018-07-02 ENCOUNTER — Other Ambulatory Visit: Payer: Self-pay

## 2018-07-02 ENCOUNTER — Ambulatory Visit (INDEPENDENT_AMBULATORY_CARE_PROVIDER_SITE_OTHER): Payer: PPO | Admitting: *Deleted

## 2018-07-02 DIAGNOSIS — I6340 Cerebral infarction due to embolism of unspecified cerebral artery: Secondary | ICD-10-CM

## 2018-07-03 LAB — CUP PACEART REMOTE DEVICE CHECK
Date Time Interrogation Session: 20200314214142
Implantable Pulse Generator Implant Date: 20190829

## 2018-07-04 ENCOUNTER — Ambulatory Visit (HOSPITAL_COMMUNITY): Payer: PPO

## 2018-07-10 NOTE — Progress Notes (Signed)
Carelink Summary Report / Loop Recorder 

## 2018-07-20 DIAGNOSIS — M25511 Pain in right shoulder: Secondary | ICD-10-CM | POA: Diagnosis not present

## 2018-07-26 DIAGNOSIS — R5383 Other fatigue: Secondary | ICD-10-CM | POA: Diagnosis not present

## 2018-07-26 DIAGNOSIS — N183 Chronic kidney disease, stage 3 (moderate): Secondary | ICD-10-CM | POA: Diagnosis not present

## 2018-07-26 DIAGNOSIS — D649 Anemia, unspecified: Secondary | ICD-10-CM | POA: Diagnosis not present

## 2018-07-27 ENCOUNTER — Encounter (HOSPITAL_COMMUNITY): Payer: Self-pay | Admitting: Emergency Medicine

## 2018-07-27 ENCOUNTER — Inpatient Hospital Stay (HOSPITAL_COMMUNITY)
Admission: EM | Admit: 2018-07-27 | Discharge: 2018-07-29 | DRG: 378 | Disposition: A | Discharge status: Home / Self Care | Payer: PPO | Attending: Internal Medicine | Admitting: Internal Medicine

## 2018-07-27 ENCOUNTER — Other Ambulatory Visit: Payer: Self-pay

## 2018-07-27 DIAGNOSIS — K922 Gastrointestinal hemorrhage, unspecified: Secondary | ICD-10-CM | POA: Diagnosis not present

## 2018-07-27 DIAGNOSIS — Z8349 Family history of other endocrine, nutritional and metabolic diseases: Secondary | ICD-10-CM

## 2018-07-27 DIAGNOSIS — Z79899 Other long term (current) drug therapy: Secondary | ICD-10-CM | POA: Diagnosis not present

## 2018-07-27 DIAGNOSIS — N183 Chronic kidney disease, stage 3 unspecified: Secondary | ICD-10-CM | POA: Diagnosis present

## 2018-07-27 DIAGNOSIS — Z8673 Personal history of transient ischemic attack (TIA), and cerebral infarction without residual deficits: Secondary | ICD-10-CM

## 2018-07-27 DIAGNOSIS — Z8711 Personal history of peptic ulcer disease: Secondary | ICD-10-CM

## 2018-07-27 DIAGNOSIS — M47816 Spondylosis without myelopathy or radiculopathy, lumbar region: Secondary | ICD-10-CM | POA: Diagnosis not present

## 2018-07-27 DIAGNOSIS — Z0000 Encounter for general adult medical examination without abnormal findings: Secondary | ICD-10-CM | POA: Diagnosis not present

## 2018-07-27 DIAGNOSIS — Z8249 Family history of ischemic heart disease and other diseases of the circulatory system: Secondary | ICD-10-CM | POA: Diagnosis not present

## 2018-07-27 DIAGNOSIS — Z1231 Encounter for screening mammogram for malignant neoplasm of breast: Secondary | ICD-10-CM | POA: Diagnosis not present

## 2018-07-27 DIAGNOSIS — K264 Chronic or unspecified duodenal ulcer with hemorrhage: Secondary | ICD-10-CM | POA: Diagnosis not present

## 2018-07-27 DIAGNOSIS — Z1389 Encounter for screening for other disorder: Secondary | ICD-10-CM | POA: Diagnosis not present

## 2018-07-27 DIAGNOSIS — R509 Fever, unspecified: Secondary | ICD-10-CM | POA: Diagnosis not present

## 2018-07-27 DIAGNOSIS — G8929 Other chronic pain: Secondary | ICD-10-CM | POA: Diagnosis present

## 2018-07-27 DIAGNOSIS — I480 Paroxysmal atrial fibrillation: Secondary | ICD-10-CM | POA: Diagnosis not present

## 2018-07-27 DIAGNOSIS — Z791 Long term (current) use of non-steroidal anti-inflammatories (NSAID): Secondary | ICD-10-CM

## 2018-07-27 DIAGNOSIS — I1310 Hypertensive heart and chronic kidney disease without heart failure, with stage 1 through stage 4 chronic kidney disease, or unspecified chronic kidney disease: Secondary | ICD-10-CM | POA: Diagnosis not present

## 2018-07-27 DIAGNOSIS — E785 Hyperlipidemia, unspecified: Secondary | ICD-10-CM | POA: Diagnosis not present

## 2018-07-27 DIAGNOSIS — D62 Acute posthemorrhagic anemia: Secondary | ICD-10-CM | POA: Diagnosis not present

## 2018-07-27 DIAGNOSIS — Z86718 Personal history of other venous thrombosis and embolism: Secondary | ICD-10-CM | POA: Diagnosis not present

## 2018-07-27 DIAGNOSIS — Z833 Family history of diabetes mellitus: Secondary | ICD-10-CM

## 2018-07-27 DIAGNOSIS — I2510 Atherosclerotic heart disease of native coronary artery without angina pectoris: Secondary | ICD-10-CM | POA: Diagnosis present

## 2018-07-27 DIAGNOSIS — T39395A Adverse effect of other nonsteroidal anti-inflammatory drugs [NSAID], initial encounter: Secondary | ICD-10-CM | POA: Diagnosis not present

## 2018-07-27 DIAGNOSIS — E119 Type 2 diabetes mellitus without complications: Secondary | ICD-10-CM | POA: Diagnosis not present

## 2018-07-27 DIAGNOSIS — R1013 Epigastric pain: Secondary | ICD-10-CM | POA: Diagnosis not present

## 2018-07-27 DIAGNOSIS — M436 Torticollis: Secondary | ICD-10-CM | POA: Diagnosis present

## 2018-07-27 DIAGNOSIS — K921 Melena: Secondary | ICD-10-CM | POA: Diagnosis not present

## 2018-07-27 DIAGNOSIS — G629 Polyneuropathy, unspecified: Secondary | ICD-10-CM | POA: Diagnosis not present

## 2018-07-27 DIAGNOSIS — N1832 Chronic kidney disease, stage 3b: Secondary | ICD-10-CM | POA: Diagnosis present

## 2018-07-27 DIAGNOSIS — I10 Essential (primary) hypertension: Secondary | ICD-10-CM | POA: Diagnosis present

## 2018-07-27 DIAGNOSIS — I252 Old myocardial infarction: Secondary | ICD-10-CM | POA: Diagnosis not present

## 2018-07-27 DIAGNOSIS — B029 Zoster without complications: Secondary | ICD-10-CM | POA: Diagnosis not present

## 2018-07-27 DIAGNOSIS — R05 Cough: Secondary | ICD-10-CM | POA: Diagnosis not present

## 2018-07-27 DIAGNOSIS — M48061 Spinal stenosis, lumbar region without neurogenic claudication: Secondary | ICD-10-CM | POA: Diagnosis not present

## 2018-07-27 DIAGNOSIS — D649 Anemia, unspecified: Secondary | ICD-10-CM | POA: Diagnosis not present

## 2018-07-27 DIAGNOSIS — I4891 Unspecified atrial fibrillation: Secondary | ICD-10-CM | POA: Diagnosis not present

## 2018-07-27 DIAGNOSIS — Z7901 Long term (current) use of anticoagulants: Secondary | ICD-10-CM | POA: Diagnosis not present

## 2018-07-27 DIAGNOSIS — Z6828 Body mass index (BMI) 28.0-28.9, adult: Secondary | ICD-10-CM | POA: Diagnosis not present

## 2018-07-27 DIAGNOSIS — M2041 Other hammer toe(s) (acquired), right foot: Secondary | ICD-10-CM | POA: Diagnosis not present

## 2018-07-27 LAB — CBC
HCT: 24.7 % — ABNORMAL LOW (ref 39.0–52.0)
HCT: 25.6 % — ABNORMAL LOW (ref 39.0–52.0)
Hemoglobin: 7.3 g/dL — ABNORMAL LOW (ref 13.0–17.0)
Hemoglobin: 8 g/dL — ABNORMAL LOW (ref 13.0–17.0)
MCH: 25.2 pg — ABNORMAL LOW (ref 26.0–34.0)
MCH: 26.1 pg (ref 26.0–34.0)
MCHC: 29.6 g/dL — ABNORMAL LOW (ref 30.0–36.0)
MCHC: 31.3 g/dL (ref 30.0–36.0)
MCV: 85.2 fL (ref 80.0–100.0)
MCV: 83.7 fL (ref 80.0–100.0)
Platelets: 206 10*3/uL (ref 150–400)
Platelets: 225 10*3/uL (ref 150–400)
RBC: 2.9 MIL/uL — ABNORMAL LOW (ref 4.22–5.81)
RBC: 3.06 MIL/uL — ABNORMAL LOW (ref 4.22–5.81)
RDW: 23.5 % — ABNORMAL HIGH (ref 11.5–15.5)
RDW: 22.2 % — ABNORMAL HIGH (ref 11.5–15.5)
WBC: 10.5 10*3/uL (ref 4.0–10.5)
WBC: 10.8 10*3/uL — ABNORMAL HIGH (ref 4.0–10.5)
nRBC: 0.4 % — ABNORMAL HIGH (ref 0.0–0.2)
nRBC: 0.4 % — ABNORMAL HIGH (ref 0.0–0.2)

## 2018-07-27 LAB — COMPREHENSIVE METABOLIC PANEL
ALT: 50 U/L — ABNORMAL HIGH (ref 0–44)
AST: 39 U/L (ref 15–41)
Albumin: 3.2 g/dL — ABNORMAL LOW (ref 3.5–5.0)
Alkaline Phosphatase: 60 U/L (ref 38–126)
Anion gap: 8 (ref 5–15)
BUN: 43 mg/dL — ABNORMAL HIGH (ref 8–23)
CO2: 23 mmol/L (ref 22–32)
Calcium: 8.9 mg/dL (ref 8.9–10.3)
Chloride: 111 mmol/L (ref 98–111)
Creatinine, Ser: 2.09 mg/dL — ABNORMAL HIGH (ref 0.61–1.24)
GFR calc Af Amer: 37 mL/min — ABNORMAL LOW (ref >60)
GFR calc non Af Amer: 32 mL/min — ABNORMAL LOW (ref >60)
Glucose, Bld: 110 mg/dL — ABNORMAL HIGH (ref 70–99)
Potassium: 4.2 mmol/L (ref 3.5–5.1)
Sodium: 142 mmol/L (ref 135–145)
Total Bilirubin: 0.2 mg/dL — ABNORMAL LOW (ref 0.3–1.2)
Total Protein: 5.1 g/dL — ABNORMAL LOW (ref 6.5–8.1)

## 2018-07-27 LAB — POC OCCULT BLOOD, ED: Fecal Occult Bld: POSITIVE — AB

## 2018-07-27 LAB — PREPARE RBC (CROSSMATCH)

## 2018-07-27 LAB — PROTIME-INR
INR: 1.1 (ref 0.8–1.2)
Prothrombin Time: 14.4 seconds (ref 11.4–15.2)

## 2018-07-27 LAB — TROPONIN I: Troponin I: 0.28 ng/mL (ref <0.03)

## 2018-07-27 MED ORDER — CARVEDILOL 25 MG PO TABS
25.0000 mg | ORAL_TABLET | Freq: Two times a day (BID) | ORAL | Status: DC
  Administered 2018-07-27 – 2018-07-29 (×4): 25 mg via ORAL
  Filled 2018-07-27 (×4): qty 1

## 2018-07-27 MED ORDER — SODIUM CHLORIDE 0.9 % IV SOLN
80.0000 mg | Freq: Once | INTRAVENOUS | Status: AC
  Administered 2018-07-27: 80 mg via INTRAVENOUS
  Filled 2018-07-27: qty 80

## 2018-07-27 MED ORDER — SODIUM CHLORIDE 0.9% FLUSH
3.0000 mL | Freq: Two times a day (BID) | INTRAVENOUS | Status: DC
  Administered 2018-07-27 – 2018-07-28 (×2): 3 mL via INTRAVENOUS

## 2018-07-27 MED ORDER — ACETAMINOPHEN 650 MG RE SUPP: 650.0000 mg | Freq: Four times a day (QID) | RECTAL | Status: DC | PRN

## 2018-07-27 MED ORDER — PRAMIPEXOLE DIHYDROCHLORIDE 0.125 MG PO TABS
0.1250 mg | ORAL_TABLET | Freq: Every day | ORAL | Status: DC
  Administered 2018-07-27 – 2018-07-28 (×2): 0.125 mg via ORAL
  Filled 2018-07-27 (×4): qty 1

## 2018-07-27 MED ORDER — ONDANSETRON HCL 4 MG/2ML IJ SOLN: 4.0000 mg | Freq: Four times a day (QID) | INTRAMUSCULAR | Status: DC | PRN

## 2018-07-27 MED ORDER — ACETAMINOPHEN 325 MG PO TABS
650.0000 mg | ORAL_TABLET | Freq: Four times a day (QID) | ORAL | Status: DC | PRN
  Administered 2018-07-27 – 2018-07-29 (×6): 650 mg via ORAL
  Filled 2018-07-27 (×6): qty 2

## 2018-07-27 MED ORDER — SODIUM CHLORIDE 0.9% IV SOLUTION
Freq: Once | INTRAVENOUS | Status: AC
  Administered 2018-07-29: 10:00:00 via INTRAVENOUS

## 2018-07-27 MED ORDER — ONDANSETRON HCL 4 MG PO TABS: 4.0000 mg | ORAL_TABLET | Freq: Four times a day (QID) | ORAL | Status: DC | PRN

## 2018-07-27 MED ORDER — SENNOSIDES-DOCUSATE SODIUM 8.6-50 MG PO TABS: 1.0000 | ORAL_TABLET | Freq: Every evening | ORAL | Status: DC | PRN

## 2018-07-27 MED ORDER — SODIUM CHLORIDE 0.9 % IV SOLN
8.0000 mg/h | INTRAVENOUS | Status: DC
  Administered 2018-07-27 – 2018-07-29 (×4): 8 mg/h via INTRAVENOUS
  Filled 2018-07-27 (×7): qty 80

## 2018-07-27 MED ORDER — ISOSORBIDE MONONITRATE ER 30 MG PO TB24
15.0000 mg | ORAL_TABLET | Freq: Every day | ORAL | Status: DC
  Administered 2018-07-28 – 2018-07-29 (×2): 15 mg via ORAL
  Filled 2018-07-27 (×2): qty 1

## 2018-07-27 MED ORDER — GABAPENTIN 300 MG PO CAPS
300.0000 mg | ORAL_CAPSULE | Freq: Every day | ORAL | Status: DC
  Administered 2018-07-27 – 2018-07-28 (×2): 300 mg via ORAL
  Filled 2018-07-27 (×2): qty 1

## 2018-07-27 MED ORDER — PANTOPRAZOLE SODIUM 40 MG IV SOLR: 40.0000 mg | Freq: Two times a day (BID) | INTRAVENOUS | Status: DC

## 2018-07-27 MED ORDER — SODIUM CHLORIDE 0.9 % IV SOLN
INTRAVENOUS | Status: AC
  Administered 2018-07-27: 75 mL/h via INTRAVENOUS

## 2018-07-27 MED ORDER — PRAMIPEXOLE DIHYDROCHLORIDE 0.125 MG PO TABS: 0.1250 mg | ORAL_TABLET | Freq: Every day | ORAL | Status: DC

## 2018-07-27 MED ORDER — ATORVASTATIN CALCIUM 80 MG PO TABS
80.0000 mg | ORAL_TABLET | Freq: Every day | ORAL | Status: DC
  Administered 2018-07-27 – 2018-07-28 (×2): 80 mg via ORAL
  Filled 2018-07-27 (×2): qty 1

## 2018-07-27 MED ORDER — HYDRALAZINE HCL 50 MG PO TABS
100.0000 mg | ORAL_TABLET | Freq: Three times a day (TID) | ORAL | Status: DC
  Administered 2018-07-27 – 2018-07-29 (×5): 100 mg via ORAL
  Filled 2018-07-27 (×5): qty 2
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
## 2018-07-27 NOTE — ED Notes (Signed)
Headache better  

## 2018-07-27 NOTE — ED Notes (Signed)
ujnseccessful attempt to give reporft

## 2018-07-27 NOTE — ED Notes (Signed)
Blood bank is stating that the pts hgb is too high to transfuse  Dr Venora Maples notified

## 2018-07-27 NOTE — ED Triage Notes (Signed)
Pt reports he was sent by [facility] for losing blood. Pt not sure of where they believe it is coming from.

## 2018-07-27 NOTE — ED Notes (Signed)
Report given to rn on 5m 

## 2018-07-27 NOTE — ED Notes (Signed)
Pt up to br

## 2018-07-27 NOTE — ED Notes (Signed)
Pt c/o a headache  wa nts tylenol
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
## 2018-07-28 ENCOUNTER — Encounter (HOSPITAL_COMMUNITY): Payer: Self-pay | Admitting: *Deleted

## 2018-07-28 DIAGNOSIS — N183 Chronic kidney disease, stage 3 (moderate): Secondary | ICD-10-CM

## 2018-07-28 DIAGNOSIS — I10 Essential (primary) hypertension: Secondary | ICD-10-CM

## 2018-07-28 DIAGNOSIS — K922 Gastrointestinal hemorrhage, unspecified: Secondary | ICD-10-CM

## 2018-07-28 DIAGNOSIS — D62 Acute posthemorrhagic anemia: Secondary | ICD-10-CM

## 2018-07-28 LAB — CBC
HCT: 24.2 % — ABNORMAL LOW (ref 39.0–52.0)
HCT: 25.1 % — ABNORMAL LOW (ref 39.0–52.0)
Hemoglobin: 7.6 g/dL — ABNORMAL LOW (ref 13.0–17.0)
Hemoglobin: 7.8 g/dL — ABNORMAL LOW (ref 13.0–17.0)
MCH: 26.5 pg (ref 26.0–34.0)
MCH: 26 pg (ref 26.0–34.0)
MCHC: 31.4 g/dL (ref 30.0–36.0)
MCHC: 31.1 g/dL (ref 30.0–36.0)
MCV: 84.3 fL (ref 80.0–100.0)
MCV: 83.7 fL (ref 80.0–100.0)
Platelets: 184 10*3/uL (ref 150–400)
Platelets: 187 10*3/uL (ref 150–400)
RBC: 2.87 MIL/uL — ABNORMAL LOW (ref 4.22–5.81)
RBC: 3 MIL/uL — ABNORMAL LOW (ref 4.22–5.81)
RDW: 22.3 % — ABNORMAL HIGH (ref 11.5–15.5)
RDW: 21.9 % — ABNORMAL HIGH (ref 11.5–15.5)
WBC: 10.2 10*3/uL (ref 4.0–10.5)
WBC: 10.2 10*3/uL (ref 4.0–10.5)
nRBC: 0.2 % (ref 0.0–0.2)
nRBC: 0.2 % (ref 0.0–0.2)

## 2018-07-28 LAB — PREPARE RBC (CROSSMATCH)

## 2018-07-28 LAB — BASIC METABOLIC PANEL
Anion gap: 10 (ref 5–15)
BUN: 34 mg/dL — ABNORMAL HIGH (ref 8–23)
CO2: 20 mmol/L — ABNORMAL LOW (ref 22–32)
Calcium: 8.3 mg/dL — ABNORMAL LOW (ref 8.9–10.3)
Chloride: 111 mmol/L (ref 98–111)
Creatinine, Ser: 1.94 mg/dL — ABNORMAL HIGH (ref 0.61–1.24)
GFR calc Af Amer: 41 mL/min — ABNORMAL LOW (ref >60)
GFR calc non Af Amer: 35 mL/min — ABNORMAL LOW (ref >60)
Glucose, Bld: 91 mg/dL (ref 70–99)
Potassium: 3.4 mmol/L — ABNORMAL LOW (ref 3.5–5.1)
Sodium: 141 mmol/L (ref 135–145)

## 2018-07-28 LAB — TROPONIN I: Troponin I: 0.26 ng/mL (ref <0.03)

## 2018-07-28 MED ORDER — SODIUM CHLORIDE 0.9 % IV SOLN
510.0000 mg | Freq: Once | INTRAVENOUS | Status: AC
  Administered 2018-07-28: 510 mg via INTRAVENOUS
  Filled 2018-07-28: qty 17

## 2018-07-28 MED ORDER — SODIUM CHLORIDE 0.9% IV SOLUTION: Freq: Once | INTRAVENOUS | Status: DC
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
## 2018-07-28 NOTE — Progress Notes (Signed)
Notified MD Venetia Constable via amion that pt's Hgb= 7.8 and k=3.4. He called back and stated to DC order of rechecking Hgb Q6H, place order for BMET tomorrow AM, DC unit of blood, continue to monitor K.   Paulla Fore, RN

## 2018-07-28 NOTE — Progress Notes (Addendum)
[facility] PROGRESS NOTE    Progress Note  Billy [name]  WFU:[id number] DOB: 1951/04/05 DOA: 07/27/2018 PCP: Antony Contras, MD     Brief Narrative:   Billy [name] is an 68 y.o. male past medical history significant for duodenal ulcer on 04/13/2018, TIA reported as embolic due to A. fib on 06/07/2018 history of DVT hypertension NSTEMI secondary to GI bleed neuropathy who presents with fatigue dizziness and mild exertional dyspnea went, who is taking NSAIDs for chronic pain to see his GI doctor today and was found to have a low hemoglobin, he relate some melanotic stools a few days back but it has cleared up, he has relate some occasional abdominal pain, which she consulted the cardiologist to resume his Eliquis.  Which he has not done yet, he has been taking   Assessment/Plan:   Acute Blood loss anemia likely due to upper GI bleed: Likely duodenal ulcer due NSAIDs and steroids, likely he had not been taking his Eliquis. GI has been consulted. Started on IV Protonix twice daily. Status post 1 unit of packed red blood cells.  Hemoglobin is still 7.6 Monitoring CBC.  Will transfuse an additional 1 unit of packed red blood cells as he has had a non-ST elevation MI and has coronary artery disease, will try to keep hemoglobin above 8. Keep the patient n.p.o., [facility] to see for possible EGD.  Elevated cardiac biomarkers: Have remained flat, in the setting of chronic renal disease. He denies any chest pain or shortness of breath twelve-lead EKG showed no signs of ACS.  Chronic kidney disease stage III: Creatinine at baseline.  Essential Hypertension: Continue Coreg and Imdur, use hydralazine IV PRN  Hyperlipidemia: Cont statins.   DVT prophylaxis: SCD Family Communication:None Disposition Plan/Barrier to D/C: home once GI bleed improved Code Status:     Code Status Orders  (From admission, onward)         Start     Ordered   07/27/18 2122  Full code  Continuous     07/27/18 2121        Code Status History    Date Active Date Inactive Code Status Order ID Comments User Context   04/11/2018 0420 04/16/2018 1727 Full Code [id number]  Etta Quill, DO ED   12/12/2017 0341 12/14/2017 2104 Full Code [id number]  Vianne Bulls, MD ED   08/22/2017 1050 08/22/2017 1544 Full Code [id number]  Serafina Mitchell, MD Inpatient        IV Access:    Peripheral IV   Procedures and diagnostic studies:   No results found.   Medical Consultants:    None.  Anti-Infectives:   None  Subjective:    [name], epigastric pain.  Objective:    Vitals:   07/27/18 2045 07/27/18 2055 07/27/18 2123 07/28/18 0549  BP:   (!) 177/83 (!) 148/77  Pulse:   66 64  Resp:   (!) 22 (!) 21  Temp: 98.7 F (37.1 C) 98.8 F (37.1 C) 98.2 F (36.8 C) 98.4 F (36.9 C)  TempSrc:   Oral Oral  SpO2:   100% 96%  Weight:   80.2 kg   Height:        Intake/Output Summary (Last 24 hours) at 07/28/2018 0825 Last data filed at 07/28/2018 0549 Gross per 24 hour  Intake 577 ml  Output 600 ml  Net -23 ml   Filed Weights   07/27/18 1425 07/27/18 2123  Weight: 79.4 kg 80.2 kg    Exam: General  exam: In no acute distress. Respiratory system: Good air movement and clear to auscultation. Cardiovascular system: S1 & S2 heard, RRR.  Gastrointestinal system: Bowel sounds soft epigastric tenderness no rebound or guarding. Central nervous system: Alert and oriented. No focal neurological deficits. Extremities: No pedal edema. Skin: No rashes, lesions or ulcers Psychiatry: Judgement and insight appear normal. Mood & affect appropriate.    Data Reviewed:    Labs: Basic Metabolic Panel: Recent Labs  Lab 07/27/18 1611 07/28/18 0429  NA 142 141  K 4.2 3.4*  CL 111 111  CO2 23 20*  GLUCOSE 110* 91  BUN 43* 34*  CREATININE 2.09* 1.94*  CALCIUM 8.9 8.3*   GFR Estimated Creatinine Clearance: 43.1 mL/min (A) (by C-G formula based on SCr of 1.94 mg/dL (H)). Liver  Function Tests: Recent Labs  Lab 07/27/18 1611  AST 39  ALT 50*  ALKPHOS 60  BILITOT 0.2*  PROT 5.1*  ALBUMIN 3.2*   No results for input(s): LIPASE, AMYLASE in the last 168 hours. No results for input(s): AMMONIA in the last 168 hours. Coagulation profile Recent Labs  Lab 07/27/18 1700  INR 1.1    CBC: Recent Labs  Lab 07/27/18 1611 07/27/18 2218 07/28/18 0429  WBC 10.5 10.8* 10.2  HGB 7.3* 8.0* 7.6*  HCT 24.7* 25.6* 24.2*  MCV 85.2 83.7 84.3  PLT 206 225 184   Cardiac Enzymes: Recent Labs  Lab 07/27/18 2218 07/28/18 0429  TROPONINI 0.28* 0.26*   BNP (last 3 results) No results for input(s): PROBNP in the last 8760 hours. CBG: No results for input(s): GLUCAP in the last 168 hours. D-Dimer: No results for input(s): DDIMER in the last 72 hours. Hgb A1c: No results for input(s): HGBA1C in the last 72 hours. Lipid Profile: No results for input(s): CHOL, HDL, LDLCALC, TRIG, CHOLHDL, LDLDIRECT in the last 72 hours. Thyroid function studies: No results for input(s): TSH, T4TOTAL, T3FREE, THYROIDAB in the last 72 hours.  Invalid input(s): FREET3 Anemia work up: No results for input(s): VITAMINB12, FOLATE, FERRITIN, TIBC, IRON, RETICCTPCT in the last 72 hours. Sepsis Labs: Recent Labs  Lab 07/27/18 1611 07/27/18 2218 07/28/18 0429  WBC 10.5 10.8* 10.2   Microbiology No results found for this or any previous visit (from the past 240 hour(s)).   Medications:   . sodium chloride   Intravenous Once  . atorvastatin  80 mg Oral q1800  . carvedilol  25 mg Oral BID WC  . gabapentin  300 mg Oral QHS  . hydrALAZINE  100 mg Oral TID  . isosorbide mononitrate  15 mg Oral Daily  . [START ON 07/31/2018] pantoprazole  40 mg Intravenous Q12H  . pramipexole  0.125 mg Oral QHS  . sodium chloride flush  3 mL Intravenous Q12H   Continuous Infusions: . sodium chloride 75 mL/hr (07/27/18 2143)  . pantoprozole (PROTONIX) infusion 8 mg/hr (07/28/18 0622)      LOS:  1 day   Charlynne Cousins  [facility]  07/28/2018, 8:25 AM

## 2018-07-29 LAB — BASIC METABOLIC PANEL WITH GFR
Anion gap: 9 (ref 5–15)
BUN: 27 mg/dL — ABNORMAL HIGH (ref 8–23)
CO2: 21 mmol/L — ABNORMAL LOW (ref 22–32)
Calcium: 8.2 mg/dL — ABNORMAL LOW (ref 8.9–10.3)
Chloride: 111 mmol/L (ref 98–111)
Creatinine, Ser: 1.8 mg/dL — ABNORMAL HIGH (ref 0.61–1.24)
GFR calc Af Amer: 45 mL/min — ABNORMAL LOW
GFR calc non Af Amer: 39 mL/min — ABNORMAL LOW
Glucose, Bld: 102 mg/dL — ABNORMAL HIGH (ref 70–99)
Potassium: 3.6 mmol/L (ref 3.5–5.1)
Sodium: 141 mmol/L (ref 135–145)

## 2018-07-29 LAB — HEMOGLOBIN AND HEMATOCRIT, BLOOD
HCT: 23.5 % — ABNORMAL LOW (ref 39.0–52.0)
Hemoglobin: 7.3 g/dL — ABNORMAL LOW (ref 13.0–17.0)

## 2018-07-29 LAB — PREPARE RBC (CROSSMATCH)

## 2018-07-29 MED ORDER — FERROUS SULFATE 325 (65 FE) MG PO TABS: 325.0000 mg | ORAL_TABLET | Freq: Every day | ORAL | 3 refills | Status: DC

## 2018-07-29 MED ORDER — SODIUM CHLORIDE 0.9% IV SOLUTION: Freq: Once | INTRAVENOUS | Status: AC

## 2018-07-29 MED ORDER — FERROUS SULFATE 325 (65 FE) MG PO TABS
325.0000 mg | ORAL_TABLET | Freq: Every day | ORAL | Status: DC
  Administered 2018-07-29: 325 mg via ORAL
  Filled 2018-07-29: qty 1

## 2018-07-29 MED ORDER — TIZANIDINE HCL 4 MG PO TABS: 4.0000 mg | ORAL_TABLET | Freq: Three times a day (TID) | ORAL | 0 refills | Status: DC | PRN

## 2018-07-29 MED ORDER — PANTOPRAZOLE SODIUM 40 MG PO TBEC: 40.0000 mg | DELAYED_RELEASE_TABLET | Freq: Two times a day (BID) | ORAL | 2 refills | Status: DC

## 2018-07-29 MED ORDER — TIZANIDINE HCL 2 MG PO TABS
2.0000 mg | ORAL_TABLET | Freq: Once | ORAL | Status: AC
  Administered 2018-07-29: 2 mg via ORAL
  Filled 2018-07-29: qty 1

## 2018-07-29 NOTE — Discharge Summary (Signed)
Physician Discharge Summary  Billy [name] OIN:[id number] DOB: 1951/03/06 DOA: 07/27/2018  PCP: Antony Contras, MD  Admit date: 07/27/2018 Discharge date: 07/29/2018  Admitted From: Home Disposition:  Home  Recommendations for Outpatient Follow-up:  1. Follow up with PCP in 1-2 weeks 2. Follow with card as an outpatient and address anticoagulation  Home Health:No Equipment/Devices:None  Discharge Condition:Stable CODE STATUS:Full  Diet recommendation: Heart Healthy  Brief/Interim Summary: 68 y.o. male past medical history significant for duodenal ulcer on 04/13/2018, TIA reported as embolic due to A. fib on 06/07/2018 history of DVT hypertension NSTEMI secondary to GI bleed neuropathy who presents with fatigue dizziness and mild exertional dyspnea went, who is taking NSAIDs for chronic pain to see his GI doctor today and was found to have a low hemoglobin, he relate some melanotic stools a few days back but it has cleared up, he has relate some occasional abdominal pain  Discharge Diagnoses:  Active Problems:   Kidney disease, chronic, stage III (GFR 30-59 ml/min) (HCC)   Hypertension   Peripheral neuropathy   Hyperlipidemia   Acute blood loss anemia   Upper GI bleeding  Acute blood loss anemia likely due to an upper GI bleed: In the setting of NSAID and steroid use. Patient has not been taking his Eliquis since December 2019. He was started on IV Protonix twice a day. GI was consulted who recommended a follow-up EGD as an outpatient. He was transfused 2 units of packed red blood cells his hemoglobin improved. We will continue Protonix p.o. twice daily as an outpatient and will follow up with GI as an outpatient to perform an endoscopy in 6 to 8 weeks.  Elevated cardiac biomarkers: They remain flat, in the setting of chronic renal disease likely the causative agent. He denies any chest pain or shortness of breath twelve-lead EKG showed no signs of ACS.  Chronic kidney disease  stage III: His creatinine remained at baseline.  Paroxysmal atrial fibrillation: We will not start Eliquis at this point, he has not been taking it since December 2019. He will follow-up with cardiology as an outpatient in order to dictate when to resume his Eliquis. He has remained sinus rhythm in the hospital.  Essential hypertension: No changes were made.  Discharge Instructions  Discharge Instructions    Diet - low sodium heart healthy   Complete by:  As directed    Increase activity slowly   Complete by:  As directed      Allergies as of 07/29/2018      Reactions   Morphine And Related Other (See Comments)   "Cannot handle it"      Medication List    STOP taking these medications   apixaban 5 MG Tabs tablet Commonly known as:  Eliquis   ibuprofen 200 MG tablet Commonly known as:  ADVIL,MOTRIN     TAKE these medications   atorvastatin 80 MG tablet Commonly known as:  LIPITOR Take 1 tablet (80 mg total) by mouth daily at 6 PM for 30 days.   carvedilol 25 MG tablet Commonly known as:  COREG Take 0.5-1 tablets (12.5-25 mg total) by mouth 2 (two) times daily with a meal. What changed:  how much to take   ferrous sulfate 325 (65 FE) MG tablet Take 1 tablet (325 mg total) by mouth daily with breakfast.   gabapentin 100 MG capsule Commonly known as:  Neurontin Take 2 capsules (200 mg total) by mouth at bedtime. What changed:  Another medication with the same name was  removed. Continue taking this medication, and follow the directions you see here.   hydrALAZINE 100 MG tablet Commonly known as:  APRESOLINE Take 100 mg by mouth 3 (three) times daily.   isosorbide mononitrate 30 MG 24 hr tablet Commonly known as:  IMDUR Take 0.5 tablets (15 mg total) by mouth daily.   pantoprazole 40 MG tablet Commonly known as:  PROTONIX Take 1 tablet (40 mg total) by mouth 2 (two) times daily before a meal.   pramipexole 0.125 MG tablet Commonly known as:  MIRAPEX Take  0.125 mg by mouth at bedtime.   tiZANidine 4 MG tablet Commonly known as:  Zanaflex Take 1 tablet (4 mg total) by mouth every 8 (eight) hours as needed for muscle spasms.      Follow-up Information    Antony Contras, MD.   Specialty:  Family Medicine Contact information: [place] [address] [phone]          Allergies  Allergen Reactions  . Morphine And Related Other (See Comments)    "Cannot handle it"    Consultations:  Gi   Procedures/Studies:  No results found. (Echo, Carotid, EGD, Colonoscopy, ERCP)    Subjective: No complaints feels great.  Discharge Exam: Vitals:   07/29/18 0128 07/29/18 0914  BP: (!) 179/78 (!) 164/77  Pulse: 68 63  Resp:  (!) 21  Temp: 98.1 F (36.7 C) 97.8 F (36.6 C)  SpO2: 98% 99%     General: Pt is alert, awake, not in acute distress Cardiovascular: RRR, S1/S2 +, no rubs, no gallops Respiratory: CTA bilaterally, no wheezing, no rhonchi Abdominal: Soft, NT, ND, bowel sounds + Extremities: no edema, no cyanosis    The results of significant diagnostics from this hospitalization (including imaging, microbiology, ancillary and laboratory) are listed below for reference.     Microbiology: No results found for this or any previous visit (from the past 240 hour(s)).   Labs: BNP (last 3 results) No results for input(s): BNP in the last 8760 hours. Basic Metabolic Panel: Recent Labs  Lab 07/27/18 1611 07/28/18 0429 07/29/18 0559  NA 142 141 141  K 4.2 3.4* 3.6  CL 111 111 111  CO2 23 20* 21*  GLUCOSE 110* 91 102*  BUN 43* 34* 27*  CREATININE 2.09* 1.94* 1.80*  CALCIUM 8.9 8.3* 8.2*   Liver Function Tests: Recent Labs  Lab 07/27/18 1611  AST 39  ALT 50*  ALKPHOS 60  BILITOT 0.2*  PROT 5.1*  ALBUMIN 3.2*   No results for input(s): LIPASE, AMYLASE in the last 168 hours. No results for input(s): AMMONIA in the last 168 hours. CBC: Recent Labs  Lab 07/27/18 1611  07/27/18 2218 07/28/18 0429 07/28/18 0945 07/29/18 0559  WBC 10.5 10.8* 10.2 10.2  --   HGB 7.3* 8.0* 7.6* 7.8* 7.3*  HCT 24.7* 25.6* 24.2* 25.1* 23.5*  MCV 85.2 83.7 84.3 83.7  --   PLT 206 225 184 187  --    Cardiac Enzymes: Recent Labs  Lab 07/27/18 2218 07/28/18 0429  TROPONINI 0.28* 0.26*   BNP: Invalid input(s): POCBNP CBG: No results for input(s): GLUCAP in the last 168 hours. D-Dimer No results for input(s): DDIMER in the last 72 hours. Hgb A1c No results for input(s): HGBA1C in the last 72 hours. Lipid Profile No results for input(s): CHOL, HDL, LDLCALC, TRIG, CHOLHDL, LDLDIRECT in the last 72 hours. Thyroid function studies No results for input(s): TSH, T4TOTAL, T3FREE, THYROIDAB in the last 72 hours.  Invalid input(s): FREET3 Anemia work up No results for input(s): VITAMINB12, FOLATE, FERRITIN, TIBC, IRON, RETICCTPCT in the last 72 hours. Urinalysis    Component Value Date/Time   COLORURINE YELLOW 03/20/2017 2227   APPEARANCEUR CLEAR 03/20/2017 2227   LABSPEC 1.020 03/20/2017 2227   PHURINE 5.0 03/20/2017 2227   GLUCOSEU NEGATIVE 03/20/2017 2227   HGBUR SMALL (A) 03/20/2017 2227   BILIRUBINUR NEGATIVE 03/20/2017 2227   KETONESUR NEGATIVE 03/20/2017 2227   PROTEINUR 100 (A) 03/20/2017 2227   NITRITE NEGATIVE 03/20/2017 2227   LEUKOCYTESUR NEGATIVE 03/20/2017 2227   Sepsis Labs Invalid input(s): PROCALCITONIN,  WBC,  LACTICIDVEN Microbiology No results found for this or any previous visit (from the past 240 hour(s)).   Time coordinating discharge: 40 minutes  SIGNED:   Charlynne Cousins, MD  [facility]

## 2018-07-29 NOTE — Progress Notes (Signed)
Subjective: The patient was seen and examined at bedside. He reports having 2 bowel movements yesterday, they were not black. Denies abdominal pain and has been able to tolerate regular diet.  Objective: Vital signs in last 24 hours: Temp:  [97.8 F (36.6 C)-98.1 F (36.7 C)] 98.1 F (36.7 C) (04/12 0128) Pulse Rate:  [62-68] 68 (04/12 0128) Resp:  [18-20] 20 (04/11 1600) BP: (150-181)/(69-85) 179/78 (04/12 0128) SpO2:  [98 %-99 %] 98 % (04/12 0128) Weight change:  Last BM Date: 07/28/18  PE: Mild pallor, no icterus GENERAL: Not in distress ABDOMEN: Nondistended EXTREMITIES: no deformity  Lab Results: Results for orders placed or performed during the hospital encounter of 07/27/18 (from the past 48 hour(s))  Type and screen [place]     Status: None   Collection Time: 07/27/18  4:09 PM  Result Value Ref Range   ABO/RH(D) A POS    Antibody Screen NEG    Sample Expiration 07/30/2018    Unit Number [id number]    Blood Component Type RBC LR PHER1    Unit division 00    Status of Unit ISSUED,FINAL    Transfusion Status OK TO TRANSFUSE    Crossmatch Result      Compatible Performed at [facility], [address], [place], [place] [address]   Comprehensive metabolic panel     Status: Abnormal   Collection Time: 07/27/18  4:11 PM  Result Value Ref Range   Sodium 142 135 - 145 mmol/L   Potassium 4.2 3.5 - 5.1 mmol/L   Chloride 111 98 - 111 mmol/L   CO2 23 22 - 32 mmol/L   Glucose, Bld 110 (H) 70 - 99 mg/dL   BUN 43 (H) 8 - 23 mg/dL   Creatinine, Ser 2.09 (H) 0.61 - 1.24 mg/dL   Calcium 8.9 8.9 - 10.3 mg/dL   Total Protein 5.1 (L) 6.5 - 8.1 g/dL   Albumin 3.2 (L) 3.5 - 5.0 g/dL   AST 39 15 - 41 U/L   ALT 50 (H) 0 - 44 U/L   Alkaline Phosphatase 60 38 - 126 U/L   Total Bilirubin 0.2 (L) 0.3 - 1.2 mg/dL   GFR calc non Af Amer 32 (L) >60 mL/min   GFR calc Af Amer 37 (L) >60 mL/min   Anion gap 8 5 - 15    Comment: Performed at [facility], [place] [address] [place], [place] [address]  CBC     Status: Abnormal   Collection Time: 07/27/18  4:11 PM  Result Value Ref Range   WBC 10.5 4.0 - 10.5 K/uL   RBC 2.90 (L) 4.22 - 5.81 MIL/uL   Hemoglobin 7.3 (L) 13.0 - 17.0 g/dL   HCT 24.7 (L) 39.0 - 52.0 %   MCV 85.2 80.0 - 100.0 fL   MCH 25.2 (L) 26.0 - 34.0 pg   MCHC 29.6 (L) 30.0 - 36.0 g/dL   RDW 23.5 (H) 11.5 - 15.5 %   Platelets 206 150 - 400 K/uL   nRBC 0.4 (H) 0.0 - 0.2 %    Comment: Performed at [facility], [place] [address] [place], [place] [address]  Protime-INR     Status: None   Collection Time: 07/27/18  5:00 PM  Result Value Ref Range   Prothrombin Time 14.4 11.4 - 15.2 seconds   INR 1.1 0.8 - 1.2    Comment: (NOTE) INR goal varies based on device and disease states. Performed at  [facility], [place] [address] [place], [place] [address]   POC occult blood, ED     Status: Abnormal   Collection Time: 07/27/18  5:58 PM  Result Value Ref Range   Fecal Occult Bld POSITIVE (A) NEGATIVE  Prepare RBC     Status: None   Collection Time: 07/27/18  6:30 PM  Result Value Ref Range   Order Confirmation      ORDER PROCESSED BY BLOOD BANK Performed at [facility], [place] [address], [place], [place] [address]   CBC     Status: Abnormal   Collection Time: 07/27/18 10:18 PM  Result Value Ref Range   WBC 10.8 (H) 4.0 - 10.5 K/uL   RBC 3.06 (L) 4.22 - 5.81 MIL/uL   Hemoglobin 8.0 (L) 13.0 - 17.0 g/dL   HCT 25.6 (L) 39.0 - 52.0 %   MCV 83.7 80.0 - 100.0 fL   MCH 26.1 26.0 - 34.0 pg   MCHC 31.3 30.0 - 36.0 g/dL   RDW 22.2 (H) 11.5 - 15.5 %   Platelets 225 150 - 400 K/uL   nRBC 0.4 (H) 0.0 - 0.2 %    Comment: Performed at [facility] [address], [place], [place] [address]  Troponin I - Now Then Q6H     Status: Abnormal   Collection Time: 07/27/18 10:18 PM  Result Value Ref Range   Troponin I 0.28 (HH) <0.03 ng/mL    Comment: CRITICAL RESULT CALLED TO, READ BACK BY AND VERIFIED  WITH: BROWN V,RN 07/27/18 2313 WAYK Performed at [facility], [place] [address], [place], [place] [address]   CBC     Status: Abnormal   Collection Time: 07/28/18  4:29 AM  Result Value Ref Range   WBC 10.2 4.0 - 10.5 K/uL   RBC 2.87 (L) 4.22 - 5.81 MIL/uL   Hemoglobin 7.6 (L) 13.0 - 17.0 g/dL   HCT 24.2 (L) 39.0 - 52.0 %   MCV 84.3 80.0 - 100.0 fL   MCH 26.5 26.0 - 34.0 pg   MCHC 31.4 30.0 - 36.0 g/dL   RDW 22.3 (H) 11.5 - 15.5 %   Platelets 184 150 - 400 K/uL   nRBC 0.2 0.0 - 0.2 %    Comment: Performed at [facility], [place] [address] [place], [place] [address]  Basic metabolic panel     Status: Abnormal   Collection Time: 07/28/18  4:29 AM  Result Value Ref Range   Sodium 141 135 - 145 mmol/L   Potassium 3.4 (L) 3.5 - 5.1 mmol/L   Chloride 111 98 - 111 mmol/L   CO2 20 (L) 22 - 32 mmol/L   Glucose, Bld 91 70 - 99 mg/dL   BUN 34 (H) 8 - 23 mg/dL   Creatinine, Ser 1.94 (H) 0.61 - 1.24 mg/dL   Calcium 8.3 (L) 8.9 - 10.3 mg/dL   GFR calc non Af Amer 35 (L) >60 mL/min   GFR calc Af Amer 41 (L) >60 mL/min   Anion gap 10 5 - 15    Comment: Performed at [facility] [address], [place], [place] [address]  Troponin I - Now Then Q6H     Status: Abnormal   Collection Time: 07/28/18  4:29 AM  Result Value Ref Range   Troponin I 0.26 (HH) <0.03 ng/mL    Comment: CRITICAL VALUE NOTED.  VALUE IS CONSISTENT WITH PREVIOUSLY REPORTED AND CALLED VALUE. Performed at [facility], [place] [address] [place], [place] [address]   Prepare RBC     Status: None   Collection Time: 07/28/18  8:37 AM  Result Value Ref Range   Order Confirmation      BB SAMPLE OR UNITS ALREADY AVAILABLE Performed at [facility], [address], [place], [place] [address]   CBC     Status: Abnormal   Collection Time: 07/28/18  9:45 AM  Result Value Ref Range   WBC 10.2 4.0 - 10.5 K/uL   RBC 3.00 (L) 4.22 - 5.81 MIL/uL   Hemoglobin 7.8 (L) 13.0 - 17.0 g/dL   HCT 25.1 (L) 39.0 -  52.0 %   MCV 83.7 80.0 - 100.0 fL   MCH 26.0 26.0 - 34.0 pg   MCHC 31.1 30.0 - 36.0 g/dL   RDW 21.9 (H) 11.5 - 15.5 %   Platelets 187 150 - 400 K/uL   nRBC 0.2 0.0 - 0.2 %    Comment: Performed at [facility], [place] [address], [place], [place] [address]  Basic metabolic panel     Status: Abnormal   Collection Time: 07/29/18  5:59 AM  Result Value Ref Range   Sodium 141 135 - 145 mmol/L   Potassium 3.6 3.5 - 5.1 mmol/L   Chloride 111 98 - 111 mmol/L   CO2 21 (L) 22 - 32 mmol/L   Glucose, Bld 102 (H) 70 - 99 mg/dL   BUN 27 (H) 8 - 23 mg/dL   Creatinine, Ser 1.80 (H) 0.61 - 1.24 mg/dL   Calcium 8.2 (L) 8.9 - 10.3 mg/dL   GFR calc non Af Amer 39 (L) >60 mL/min   GFR calc Af Amer 45 (L) >60 mL/min   Anion gap 9 5 - 15    Comment: Performed at [facility] [address], [place], [place] [address]  Hemoglobin and hematocrit, blood     Status: Abnormal   Collection Time: 07/29/18  5:59 AM  Result Value Ref Range   Hemoglobin 7.3 (L) 13.0 - 17.0 g/dL   HCT 23.5 (L) 39.0 - 52.0 %    Comment: Performed at [facility] [address], [place],  [address]    Studies/Results: No results found.  Medications: I have reviewed the patient's current medications.  Assessment: Black stools, anemia, history of duodenal bulb ulcer, recent use of steroids and NSAIDs suspicious for upper GI bleeding. EGD not performed as patient did not have any evidence of active bleeding and remained hemodynamically stable. Patient has received 1 unit of PRBC transfusion on admission and received Feraheme 510 mg IV 1 dose yesterday Hemoglobin remained stable at 7.6/7.8/7.3  Plan: Okay to discharge patient on PPI twice daily. Prescription for pantoprazole 40 mg twice daily for 60 days with 1 refill sent to his pharmacy, patient aware. Patient advised to avoid NSAIDs, aspirin. Will need EGD as an outpatient in 6 to 8 weeks, and to perform screening colonoscopy at the same time(his  last colonoscopy was in 2009). Patient requesting for prescription for Zanaflex for neck stiffness as he wants to avoid NSAIDs, advised patient to discuss this with his primary team and his primary care physician. Meanwhile recommend ferrous sulfate 325 mg p.o. daily,advised to use MiraLAX/Metamucil/Benefiber if he develops constipation.   Ronnette Juniper 07/29/2018, 8:30 AM    Pager [phone] If no answer or after 5 PM call [phone]

## 2018-07-29 NOTE — Progress Notes (Signed)
Patient complained of headache.  Upon assessment, he stated the pain was 7/10.  He asked for Tylenol.  He then stated he felt like the headache for coming from neck stiffness and achiness that has been going on for a while.  He states he takes his mother's Zanaflex at times to help with the pain.  He wanted to know if he could have a prescription for this when he was discharged from the hospital.  I explained that I could not answer that question and it would need to be discussed with his attending.  Arby Barrette NP called and made aware of the situation.  Received order for Zanaflex x 1 which was given at [facility].  Will continue to monitor patient.  Earleen Reaper RN

## 2018-07-31 LAB — TYPE AND SCREEN
ABO/RH(D): A POS
Antibody Screen: NEGATIVE
Unit division: 0
Unit division: 0

## 2018-07-31 LAB — BPAM RBC
Blood Product Expiration Date: 202004192359
Blood Product Expiration Date: 202004232359
ISSUE DATE / TIME: 202004101840
ISSUE DATE / TIME: 202004121018
Unit Type and Rh: 6200
Unit Type and Rh: 6200

## 2018-08-02 ENCOUNTER — Other Ambulatory Visit: Payer: Self-pay

## 2018-08-02 ENCOUNTER — Ambulatory Visit (INDEPENDENT_AMBULATORY_CARE_PROVIDER_SITE_OTHER): Payer: PPO | Admitting: *Deleted

## 2018-08-02 ENCOUNTER — Other Ambulatory Visit: Payer: Self-pay | Admitting: Cardiovascular Disease

## 2018-08-02 DIAGNOSIS — I6340 Cerebral infarction due to embolism of unspecified cerebral artery: Secondary | ICD-10-CM

## 2018-08-02 DIAGNOSIS — K922 Gastrointestinal hemorrhage, unspecified: Secondary | ICD-10-CM | POA: Diagnosis not present

## 2018-08-02 DIAGNOSIS — R609 Edema, unspecified: Secondary | ICD-10-CM | POA: Diagnosis not present

## 2018-08-02 LAB — CUP PACEART REMOTE DEVICE CHECK
Date Time Interrogation Session: 20200416202153
Implantable Pulse Generator Implant Date: 20190829

## 2018-08-02 MED ORDER — ISOSORBIDE MONONITRATE ER 30 MG PO TB24: 15.0000 mg | ORAL_TABLET | Freq: Every day | ORAL | 1 refills | Status: DC

## 2018-08-02 NOTE — Telephone Encounter (Signed)
New Message     *STAT* If patient is at the pharmacy, call can be transferred to refill team.   1. Which medications need to be refilled? (please list name of each medication and dose if known) Isosorbide Mononitrate 30mg    2. Which pharmacy/location (including street and city if local pharmacy) is medication to be sent to? [facility] on [address]   3. Do they need a 30 day or 90 day supply? 30 or 90  Pt is not sure if he is suppose to take a whole tablet or a half tablet  Please advise

## 2018-08-02 NOTE — Telephone Encounter (Signed)
Isosorbide refilled. 

## 2018-08-06 ENCOUNTER — Telehealth: Payer: Self-pay | Admitting: Cardiovascular Disease

## 2018-08-06 NOTE — Telephone Encounter (Signed)
  Pt c/o medication issue:  1. Name of Medication: isosorbide mononitrate (IMDUR) 30 MG 24 hr tablet  2. How are you currently taking this medication (dosage and times per day)? Take 0.5 tablets (15 mg total) by mouth daily.  3. Are you having a reaction (difficulty breathing--STAT)?  No  4. What is your medication issue? Patient wants me to send a message over because he wants a nurse to call him to verify that he is supposed to be taking 0.5 tablet and not a whole tablet

## 2018-08-06 NOTE — Telephone Encounter (Signed)
LMTCB 4/20  Pt wants verification that he is supposed to be taking 0.5 tablets of his 30 mg Imdur  Also.Marland KitchenMarland Kitchen4/12 hospital discharge note stated the following although pt was advised on 2/28 to restart Eliquis 5 mg BID:  "Paroxysmal atrial fibrillation: We will not start Eliquis at this point, he has not been taking it since December 2019. He will follow-up with cardiology as an outpatient in order to dictate when to resume his Eliquis. He has remained sinus rhythm in the hospital."

## 2018-08-07 NOTE — Telephone Encounter (Signed)
Spoke with pt, aware isosorbide dosage is 15 mg once daily=1/2 of the 30 mg tablet once daily. Patient voiced understanding and had no other questions.

## 2018-08-08 NOTE — Progress Notes (Signed)
Carelink Summary Report / Loop Recorder 

## 2018-08-10 DIAGNOSIS — N183 Chronic kidney disease, stage 3 (moderate): Secondary | ICD-10-CM | POA: Diagnosis not present

## 2018-08-10 DIAGNOSIS — R5381 Other malaise: Secondary | ICD-10-CM | POA: Diagnosis not present

## 2018-08-10 DIAGNOSIS — Z8719 Personal history of other diseases of the digestive system: Secondary | ICD-10-CM | POA: Diagnosis not present

## 2018-08-10 DIAGNOSIS — R609 Edema, unspecified: Secondary | ICD-10-CM | POA: Diagnosis not present

## 2018-08-10 DIAGNOSIS — D649 Anemia, unspecified: Secondary | ICD-10-CM | POA: Diagnosis not present

## 2018-08-10 DIAGNOSIS — I480 Paroxysmal atrial fibrillation: Secondary | ICD-10-CM | POA: Diagnosis not present

## 2018-08-13 DIAGNOSIS — I701 Atherosclerosis of renal artery: Secondary | ICD-10-CM | POA: Diagnosis not present

## 2018-08-13 DIAGNOSIS — N183 Chronic kidney disease, stage 3 (moderate): Secondary | ICD-10-CM | POA: Diagnosis not present

## 2018-08-13 DIAGNOSIS — I129 Hypertensive chronic kidney disease with stage 1 through stage 4 chronic kidney disease, or unspecified chronic kidney disease: Secondary | ICD-10-CM | POA: Diagnosis not present

## 2018-08-13 DIAGNOSIS — I5022 Chronic systolic (congestive) heart failure: Secondary | ICD-10-CM | POA: Diagnosis not present

## 2018-08-13 DIAGNOSIS — I4891 Unspecified atrial fibrillation: Secondary | ICD-10-CM | POA: Diagnosis not present

## 2018-08-13 DIAGNOSIS — G459 Transient cerebral ischemic attack, unspecified: Secondary | ICD-10-CM | POA: Diagnosis not present

## 2018-08-13 DIAGNOSIS — K922 Gastrointestinal hemorrhage, unspecified: Secondary | ICD-10-CM | POA: Diagnosis not present

## 2018-08-20 NOTE — Telephone Encounter (Signed)
See note

## 2018-09-04 ENCOUNTER — Ambulatory Visit (INDEPENDENT_AMBULATORY_CARE_PROVIDER_SITE_OTHER): Payer: PPO | Admitting: *Deleted

## 2018-09-04 ENCOUNTER — Other Ambulatory Visit: Payer: Self-pay

## 2018-09-04 DIAGNOSIS — M25511 Pain in right shoulder: Secondary | ICD-10-CM | POA: Diagnosis not present

## 2018-09-04 DIAGNOSIS — I6340 Cerebral infarction due to embolism of unspecified cerebral artery: Secondary | ICD-10-CM

## 2018-09-05 LAB — CUP PACEART REMOTE DEVICE CHECK
Date Time Interrogation Session: 20200519234032
Implantable Pulse Generator Implant Date: 20190829

## 2018-09-06 ENCOUNTER — Telehealth: Payer: Self-pay

## 2018-09-06 NOTE — Telephone Encounter (Signed)
New message    Just an FYI. We have made several attempts to contact this patient including sending a letter to schedule or reschedule their nuclear stress test . We will be removing the patient from the nuclear WQ.   5.21.20 @ 9:32am both # are the same left a message on home vm Anant Agard  5.18.20 @ 9;08am Both # are the same - lm on home vm Junette Bernat  5.14.20 @ 10:22am Both # are the same lm on home vm Jennise Both  5.11.20 @ 10:54am lm on home vm to call Kainalu Heggs  5.6.20 @ 3:05pm spoke with pt will call back in the middle of something Shaquasha Gerstel   Thank you

## 2018-09-12 ENCOUNTER — Telehealth: Payer: Self-pay | Admitting: Cardiology

## 2018-09-12 NOTE — Telephone Encounter (Signed)
flip phone(no smartphone)/ consent/ my chart via email/ pre reg completed

## 2018-09-13 ENCOUNTER — Encounter: Payer: Self-pay | Admitting: Cardiology

## 2018-09-13 ENCOUNTER — Telehealth: Payer: Self-pay

## 2018-09-13 ENCOUNTER — Encounter: Payer: Self-pay | Admitting: Cardiovascular Disease

## 2018-09-13 ENCOUNTER — Telehealth (INDEPENDENT_AMBULATORY_CARE_PROVIDER_SITE_OTHER): Payer: PPO | Admitting: Cardiology

## 2018-09-13 DIAGNOSIS — N183 Chronic kidney disease, stage 3 unspecified: Secondary | ICD-10-CM

## 2018-09-13 DIAGNOSIS — Z8673 Personal history of transient ischemic attack (TIA), and cerebral infarction without residual deficits: Secondary | ICD-10-CM

## 2018-09-13 DIAGNOSIS — K922 Gastrointestinal hemorrhage, unspecified: Secondary | ICD-10-CM

## 2018-09-13 DIAGNOSIS — I480 Paroxysmal atrial fibrillation: Secondary | ICD-10-CM

## 2018-09-13 DIAGNOSIS — I214 Non-ST elevation (NSTEMI) myocardial infarction: Secondary | ICD-10-CM

## 2018-09-13 NOTE — Progress Notes (Signed)
Carelink Summary Report / Loop Recorder 

## 2018-09-13 NOTE — Telephone Encounter (Signed)
Called patient to discuss AVS instructions. Luke's recommendations and voiced understanding. AVS summary mailed to patient.
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
## 2018-09-13 NOTE — Patient Instructions (Signed)
Medication Instructions:  Your physician recommends that you continue on your current medications as directed. Please refer to the Current Medication list given to you today. If you need a refill on your cardiac medications before your next appointment, please call your pharmacy.   Lab work: None  If you have labs (blood work) drawn today and your tests are completely normal, you will receive your results only by: . MyChart Message (if you have MyChart) OR . A paper copy in the mail If you have any lab test that is abnormal or we need to change your treatment, we will call you to review the results.  Testing/Procedures: None   Follow-Up: At [facility], you and your health needs are our priority.  As part of our continuing mission to provide you with exceptional heart care, we have created designated Provider Care Teams.  These Care Teams include your primary Cardiologist (physician) and Advanced Practice Providers (APPs -  Physician Assistants and Nurse Practitioners) who all work together to provide you with the care you need, when you need it. You will need a follow up appointment in 3 months.  Please call our office 2 months in advance to schedule this appointment.  You may see Jonathan Berry, MD or one of the following Advanced Practice Providers on your designated Care Team:   Luke Kilroy, PA-C Krista Kroeger, PA-C . Callie Goodrich, PA-C  Any Other Special Instructions Will Be Listed Below (If Applicable).   

## 2018-09-28 ENCOUNTER — Encounter: Payer: Self-pay | Admitting: Orthopedic Surgery

## 2018-09-28 ENCOUNTER — Other Ambulatory Visit: Payer: Self-pay

## 2018-09-28 ENCOUNTER — Ambulatory Visit (INDEPENDENT_AMBULATORY_CARE_PROVIDER_SITE_OTHER): Payer: PPO

## 2018-09-28 ENCOUNTER — Ambulatory Visit (INDEPENDENT_AMBULATORY_CARE_PROVIDER_SITE_OTHER): Payer: PPO | Admitting: Orthopedic Surgery

## 2018-09-28 VITALS — Ht 74.0 in | Wt 175.0 lb

## 2018-09-28 DIAGNOSIS — M25511 Pain in right shoulder: Secondary | ICD-10-CM

## 2018-10-01 ENCOUNTER — Telehealth: Payer: Self-pay | Admitting: Orthopedic Surgery

## 2018-10-01 NOTE — Telephone Encounter (Signed)
You saw patient 3 days ago.

## 2018-10-01 NOTE — Telephone Encounter (Signed)
Please advise. Thanks.  

## 2018-10-01 NOTE — Telephone Encounter (Signed)
I do not really see where I have seen him so I cannot prescribe pain medicine this time until R OV thanks

## 2018-10-01 NOTE — Telephone Encounter (Signed)
Patient called left voicemail message needing something called in for pain. Patient said he is having trouble sleeping at night. The number to contact patient is [phone]

## 2018-10-02 ENCOUNTER — Encounter: Payer: Self-pay | Admitting: Orthopedic Surgery
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
## 2018-10-02 NOTE — Telephone Encounter (Signed)
Yes.  Just dictated.  Okay for tramadol 1 p.o. twice daily #30 with no refills.

## 2018-10-03 ENCOUNTER — Other Ambulatory Visit: Payer: Self-pay | Admitting: Orthopedic Surgery

## 2018-10-03 MED ORDER — TRAMADOL HCL 50 MG PO TABS: ORAL_TABLET | ORAL | 0 refills | Status: DC

## 2018-10-03 NOTE — Telephone Encounter (Signed)
Dr Marlou Sa submitted to pharmacy. I advised patient done.

## 2018-10-04 DIAGNOSIS — R5383 Other fatigue: Secondary | ICD-10-CM | POA: Diagnosis not present

## 2018-10-04 DIAGNOSIS — D649 Anemia, unspecified: Secondary | ICD-10-CM | POA: Diagnosis not present

## 2018-10-04 DIAGNOSIS — N183 Chronic kidney disease, stage 3 (moderate): Secondary | ICD-10-CM | POA: Diagnosis not present

## 2018-10-08 ENCOUNTER — Ambulatory Visit (INDEPENDENT_AMBULATORY_CARE_PROVIDER_SITE_OTHER): Payer: PPO | Admitting: *Deleted

## 2018-10-08 DIAGNOSIS — G459 Transient cerebral ischemic attack, unspecified: Secondary | ICD-10-CM | POA: Diagnosis not present

## 2018-10-08 LAB — CUP PACEART REMOTE DEVICE CHECK
Date Time Interrogation Session: 20200621233859
Implantable Pulse Generator Implant Date: 20190829

## 2018-10-09 DIAGNOSIS — L723 Sebaceous cyst: Secondary | ICD-10-CM | POA: Diagnosis not present

## 2018-10-09 DIAGNOSIS — Z86007 Personal history of in-situ neoplasm of skin: Secondary | ICD-10-CM | POA: Diagnosis not present

## 2018-10-09 DIAGNOSIS — L821 Other seborrheic keratosis: Secondary | ICD-10-CM | POA: Diagnosis not present

## 2018-10-09 DIAGNOSIS — L570 Actinic keratosis: Secondary | ICD-10-CM | POA: Diagnosis not present

## 2018-10-09 DIAGNOSIS — L820 Inflamed seborrheic keratosis: Secondary | ICD-10-CM | POA: Diagnosis not present

## 2018-10-12 ENCOUNTER — Encounter (HOSPITAL_COMMUNITY): Payer: Self-pay | Admitting: Cardiovascular Disease

## 2018-10-17 NOTE — Progress Notes (Signed)
Carelink Summary Report / Loop Recorder 

## 2018-10-24 ENCOUNTER — Other Ambulatory Visit: Payer: PPO

## 2018-10-24 ENCOUNTER — Inpatient Hospital Stay: Admission: RE | Admit: 2018-10-24 | Payer: PPO | Source: Ambulatory Visit

## 2018-10-29 ENCOUNTER — Other Ambulatory Visit: Payer: Self-pay | Admitting: Orthopedic Surgery

## 2018-10-29 DIAGNOSIS — M25511 Pain in right shoulder: Secondary | ICD-10-CM

## 2018-10-31 ENCOUNTER — Telehealth: Payer: Self-pay | Admitting: Orthopedic Surgery

## 2018-10-31 ENCOUNTER — Telehealth: Payer: Self-pay | Admitting: Cardiovascular Disease

## 2018-10-31 DIAGNOSIS — I129 Hypertensive chronic kidney disease with stage 1 through stage 4 chronic kidney disease, or unspecified chronic kidney disease: Secondary | ICD-10-CM | POA: Diagnosis not present

## 2018-10-31 DIAGNOSIS — N183 Chronic kidney disease, stage 3 (moderate): Secondary | ICD-10-CM | POA: Diagnosis not present

## 2018-10-31 DIAGNOSIS — K922 Gastrointestinal hemorrhage, unspecified: Secondary | ICD-10-CM | POA: Diagnosis not present

## 2018-10-31 DIAGNOSIS — G459 Transient cerebral ischemic attack, unspecified: Secondary | ICD-10-CM | POA: Diagnosis not present

## 2018-10-31 DIAGNOSIS — N189 Chronic kidney disease, unspecified: Secondary | ICD-10-CM | POA: Diagnosis not present

## 2018-10-31 DIAGNOSIS — I4891 Unspecified atrial fibrillation: Secondary | ICD-10-CM | POA: Diagnosis not present

## 2018-10-31 DIAGNOSIS — I5022 Chronic systolic (congestive) heart failure: Secondary | ICD-10-CM | POA: Diagnosis not present

## 2018-10-31 DIAGNOSIS — I701 Atherosclerosis of renal artery: Secondary | ICD-10-CM | POA: Diagnosis not present

## 2018-10-31 DIAGNOSIS — D631 Anemia in chronic kidney disease: Secondary | ICD-10-CM | POA: Diagnosis not present

## 2018-10-31 NOTE — Telephone Encounter (Signed)
° ° ° °  Pt c/o medication issue:  1. Name of Medication: isosorbide mononitrate (IMDUR) 30 MG 24 hr tablet  2. How are you currently taking this medication (dosage and times per day)? 15 mg 1x daily  3. Are you having a reaction (difficulty breathing--STAT)? no  4. What is your medication issue? Nephrologist wants the patient to start taking a full tablet, 30mg .   The patient will run out of medication sooner if he starts taking the full tablet instead of the half of a tablet.  If Dr. Gwenlyn Found agrees with the Nephrologist, the patient will need a new rx sent to his pharmacy so that he does not run out of medication

## 2018-10-31 NOTE — Telephone Encounter (Signed)
Patient called stating Dr. Marlou Sa referred him for an MRI, but he had to cancel due to the death of his mother.  Patient states he rescheduled the MRI for 11/19/18 and is requesting a prescription for pain medicine to help with pain at night.   Please advise

## 2018-11-01 MED ORDER — ISOSORBIDE MONONITRATE ER 30 MG PO TB24: 30.0000 mg | ORAL_TABLET | Freq: Every day | ORAL | 1 refills | Status: DC

## 2018-11-01 NOTE — Telephone Encounter (Signed)
Ok for tramadol 1 po q 8 # 30 pls clala thx

## 2018-11-01 NOTE — Telephone Encounter (Signed)
Going up to 30 mg of isosorbide is fine with me.

## 2018-11-01 NOTE — Telephone Encounter (Signed)
Can you please submit this for me electronically and send back to me so I can call patient? Thanks.

## 2018-11-01 NOTE — Telephone Encounter (Signed)
Left detailed message as noted for patient (DPR), call back w/any questions  

## 2018-11-01 NOTE — Telephone Encounter (Signed)
Please advise. Thanks.  

## 2018-11-02 ENCOUNTER — Other Ambulatory Visit: Payer: Self-pay | Admitting: Surgical

## 2018-11-02 MED ORDER — TRAMADOL HCL 50 MG PO TABS: 50.0000 mg | ORAL_TABLET | Freq: Three times a day (TID) | ORAL | 0 refills | Status: DC

## 2018-11-05 ENCOUNTER — Telehealth: Payer: Self-pay | Admitting: Cardiovascular Disease

## 2018-11-05 MED ORDER — AMLODIPINE BESYLATE 5 MG PO TABS: 5.0000 mg | ORAL_TABLET | Freq: Every day | ORAL | 6 refills | Status: DC

## 2018-11-05 NOTE — Telephone Encounter (Signed)
Spoke to patient Billy [name]'s advice given. 

## 2018-11-05 NOTE — Telephone Encounter (Signed)
New Message    Pt c/o BP issue:  1. What are your last 5 BP readings? After taking his medication last night it was 191/97 2. Are you having any other symptoms (ex. Dizziness, headache, blurred vision, passed out)? Blurred vision  3. What is your medication issue? None   Patient is scheduled for 7/24 to see [facility]

## 2018-11-05 NOTE — Telephone Encounter (Signed)
Returned call to patient he stated for the past several weeks his B/P has been elevated.B/P last night 191/97.Today 167/91.Pulse 50 to 60.Stated he has been taking all meds as prescribed.He has appointment with Kerin Ransom PA this Fri 7/24,but he would like to increase one of his medications before then.Advised I will send message to our pharmacist for advice.

## 2018-11-05 NOTE — Telephone Encounter (Signed)
Blood pressure improving from 191/97 last night.  Recommendations:  1. Stay cool and avoid outdoors activities for now 2. Start amlodipine 5mg  daily until follow up with Lurena Joiner on Friday. 3. Monitor BP twice daily and keep records to bring to [facility] on July/24

## 2018-11-08 ENCOUNTER — Telehealth: Payer: Self-pay | Admitting: Cardiology

## 2018-11-08 NOTE — Telephone Encounter (Signed)
° ° °  COVID-19 Pre-Screening Questions: ° °• In the past 7 to 10 days have you had a cough,  shortness of breath, headache, congestion, fever (100 or greater) body aches, chills, sore throat, or sudden loss of taste or sense of smell? noHave you been around anyone with known Covid 19. °• Have you been around anyone who is awaiting Covid 19 test results in the past 7 to 10 days? no °Have you been around anyone who has been exposed to Covid 19, or has mentioned symptoms of Covid 19 within the past 7 to 10 days? no °If you have any concerns/questions about symptoms patients report during screening (either on the phone or at threshold). Contact the provider seeing the patient or DOD for further guidance.  If neither are available contact a member of the leadership team. ° ° ° °   ° ° ° ° ° °

## 2018-11-09 ENCOUNTER — Ambulatory Visit (INDEPENDENT_AMBULATORY_CARE_PROVIDER_SITE_OTHER): Payer: PPO | Admitting: Cardiology

## 2018-11-09 ENCOUNTER — Encounter: Payer: Self-pay | Admitting: Cardiology

## 2018-11-09 ENCOUNTER — Other Ambulatory Visit: Payer: Self-pay

## 2018-11-09 ENCOUNTER — Ambulatory Visit (INDEPENDENT_AMBULATORY_CARE_PROVIDER_SITE_OTHER): Payer: PPO | Admitting: *Deleted

## 2018-11-09 VITALS — BP 170/70 | Temp 97.2°F | Ht 74.0 in | Wt 171.4 lb

## 2018-11-09 DIAGNOSIS — I480 Paroxysmal atrial fibrillation: Secondary | ICD-10-CM

## 2018-11-09 DIAGNOSIS — N183 Chronic kidney disease, stage 3 unspecified: Secondary | ICD-10-CM

## 2018-11-09 DIAGNOSIS — I10 Essential (primary) hypertension: Secondary | ICD-10-CM | POA: Diagnosis not present

## 2018-11-09 DIAGNOSIS — Z8673 Personal history of transient ischemic attack (TIA), and cerebral infarction without residual deficits: Secondary | ICD-10-CM

## 2018-11-09 DIAGNOSIS — I119 Hypertensive 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 heart failure: Secondary | ICD-10-CM

## 2018-11-09 DIAGNOSIS — I214 Non-ST elevation (NSTEMI) myocardial infarction: Secondary | ICD-10-CM | POA: Diagnosis not present

## 2018-11-09 DIAGNOSIS — I6523 Occlusion and stenosis of bilateral carotid arteries: Secondary | ICD-10-CM | POA: Diagnosis not present

## 2018-11-09 DIAGNOSIS — I252 Old myocardial infarction: Secondary | ICD-10-CM

## 2018-11-09 MED ORDER — AMLODIPINE BESYLATE 10 MG PO TABS: 10.0000 mg | ORAL_TABLET | Freq: Every day | ORAL | 3 refills | Status: DC

## 2018-11-09 NOTE — Assessment & Plan Note (Signed)
Non-STEMI in setting of GI bleed Dec 2019-Not cathed at the time and he was unable to do Myoview secondary to shoulder problems.  His shoulder is better though he has an MRI pending.

## 2018-11-09 NOTE — Assessment & Plan Note (Signed)
Lt brain CVA by MRI Aug 2019

## 2018-11-09 NOTE — Assessment & Plan Note (Addendum)
Echo 03/23/2018- EF 45-50% with grade 2 DD, moderate LVF, with multiple areas of HK. B/P by me 314 systolic and repeated 388 systolic.  I suspect his variable B/P reading are from PACs.
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
## 2018-11-09 NOTE — Assessment & Plan Note (Signed)
Documented Lt renal artery occlusion. Followed by Dr Hollie Salk

## 2018-11-09 NOTE — Patient Instructions (Addendum)
Medication Instructions:   INCREASE AMLODIPINE TO 10 MG DAILY  If you need a refill on your cardiac medications before your next appointment, please call your pharmacy.   Lab work: NONE ordered at this time of appointment   If you have labs (blood work) drawn today and your tests are completely normal, you will receive your results only by: Marland Kitchen MyChart Message (if you have MyChart) OR . A paper copy in the mail If you have any lab test that is abnormal or we need to change your treatment, we will call you to review the results.  Testing/Procedures: Your physician has requested that you have a lexiscan myoview. For further information please visit [email or web address]. Please follow instruction sheet, as given.   Follow-Up: At [facility], you and your health needs are our priority.  As part of our continuing mission to provide you with exceptional heart care, we have created designated Provider Care Teams.  These Care Teams include your primary Cardiologist (physician) and Advanced Practice Providers (APPs -  Physician Assistants and Nurse Practitioners) who all work together to provide you with the care you need, when you need it. . Your physician recommends that you schedule a follow-up appointment in 3-4 WEEKS with Kerin Ransom, PA-C   Any Other Special Instructions Will Be Listed Below (If Applicable).  Bring Blood pressure dairy to next appointment

## 2018-11-09 NOTE — Assessment & Plan Note (Signed)
Documented on LINQ.  The patient declines anticoagulation (medication precipitated GI bleed twice).  He declines GI evaluation knowing that if his PUD was healed the recommendation would be to resume Eliquis

## 2018-11-09 NOTE — Assessment & Plan Note (Signed)
Moderate by doppler

## 2018-11-10 LAB — CUP PACEART REMOTE DEVICE CHECK
Date Time Interrogation Session: 20200725001137
Implantable Pulse Generator Implant Date: 20190829

## 2018-11-12 ENCOUNTER — Other Ambulatory Visit: Payer: PPO

## 2018-11-16 NOTE — Progress Notes (Signed)
Carelink Summary Report / Loop Recorder 

## 2018-11-19 ENCOUNTER — Other Ambulatory Visit: Payer: Self-pay

## 2018-11-19 ENCOUNTER — Ambulatory Visit
Admission: RE | Admit: 2018-11-19 | Discharge: 2018-11-19 | Disposition: A | Discharge status: Home / Self Care | Payer: PPO | Source: Ambulatory Visit | Attending: Orthopedic Surgery | Admitting: Orthopedic Surgery

## 2018-11-19 ENCOUNTER — Other Ambulatory Visit: Payer: PPO

## 2018-11-19 DIAGNOSIS — M25511 Pain in right shoulder: Secondary | ICD-10-CM

## 2018-11-19 DIAGNOSIS — M75121 Complete rotator cuff tear or rupture of right shoulder, not specified as traumatic: Secondary | ICD-10-CM | POA: Diagnosis not present

## 2018-11-19 MED ORDER — IOPAMIDOL (ISOVUE-M 200) INJECTION 41%
8.0000 mL | Freq: Once | INTRAMUSCULAR | Status: AC
  Administered 2018-11-19: 8 mL via INTRA_ARTICULAR

## 2018-11-20 ENCOUNTER — Telehealth (HOSPITAL_COMMUNITY): Payer: Self-pay | Admitting: *Deleted

## 2018-11-20 NOTE — Telephone Encounter (Signed)
Close encounter 

## 2018-11-21 ENCOUNTER — Telehealth (HOSPITAL_COMMUNITY): Payer: Self-pay

## 2018-11-21 ENCOUNTER — Ambulatory Visit (INDEPENDENT_AMBULATORY_CARE_PROVIDER_SITE_OTHER): Payer: PPO | Admitting: Orthopedic Surgery

## 2018-11-21 ENCOUNTER — Encounter: Payer: Self-pay | Admitting: Orthopedic Surgery

## 2018-11-21 DIAGNOSIS — S46011D Strain of muscle(s) and tendon(s) of the rotator cuff of right shoulder, subsequent encounter: Secondary | ICD-10-CM | POA: Diagnosis not present

## 2018-11-21 MED ORDER — TRAMADOL HCL 50 MG PO TABS: 50.0000 mg | ORAL_TABLET | Freq: Three times a day (TID) | ORAL | 0 refills | Status: DC

## 2018-11-21 NOTE — Telephone Encounter (Signed)
Encounter complete. 
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
## 2018-11-22 ENCOUNTER — Other Ambulatory Visit: Payer: Self-pay

## 2018-11-22 ENCOUNTER — Ambulatory Visit (HOSPITAL_COMMUNITY)
Admission: RE | Admit: 2018-11-22 | Discharge: 2018-11-22 | Disposition: A | Discharge status: Home / Self Care | Payer: PPO | Source: Ambulatory Visit | Attending: Cardiology | Admitting: Cardiology

## 2018-11-22 DIAGNOSIS — I214 Non-ST elevation (NSTEMI) myocardial infarction: Secondary | ICD-10-CM | POA: Diagnosis not present

## 2018-11-22 LAB — MYOCARDIAL PERFUSION IMAGING
LV dias vol: 204 mL (ref 62–150)
LV sys vol: 101 mL
Peak HR: 75 {beats}/min
Rest HR: 60 {beats}/min
SDS: 10
SRS: 8
SSS: 18
TID: 1.14

## 2018-11-22 MED ORDER — TECHNETIUM TC 99M TETROFOSMIN IV KIT
10.8000 | PACK | Freq: Once | INTRAVENOUS | Status: AC | PRN
  Administered 2018-11-22: 10.8 via INTRAVENOUS
  Filled 2018-11-22: qty 11

## 2018-11-22 MED ORDER — REGADENOSON 0.4 MG/5ML IV SOLN
0.4000 mg | Freq: Once | INTRAVENOUS | Status: AC
  Administered 2018-11-22: 0.4 mg via INTRAVENOUS

## 2018-11-22 MED ORDER — TECHNETIUM TC 99M TETROFOSMIN IV KIT
32.2000 | PACK | Freq: Once | INTRAVENOUS | Status: AC | PRN
  Administered 2018-11-22: 32.2 via INTRAVENOUS
  Filled 2018-11-22: qty 33

## 2018-12-03 ENCOUNTER — Other Ambulatory Visit (HOSPITAL_COMMUNITY)
Admission: RE | Admit: 2018-12-03 | Discharge: 2018-12-03 | Disposition: A | Discharge status: Home / Self Care | Payer: PPO | Source: Ambulatory Visit | Attending: Orthopedic Surgery | Admitting: Orthopedic Surgery

## 2018-12-03 ENCOUNTER — Telehealth: Payer: Self-pay | Admitting: Radiology

## 2018-12-03 ENCOUNTER — Encounter (HOSPITAL_COMMUNITY): Payer: Self-pay | Admitting: *Deleted

## 2018-12-03 DIAGNOSIS — Z20828 Contact with and (suspected) exposure to other viral communicable diseases: Secondary | ICD-10-CM | POA: Insufficient documentation

## 2018-12-03 DIAGNOSIS — M7551 Bursitis of right shoulder: Secondary | ICD-10-CM | POA: Diagnosis not present

## 2018-12-03 DIAGNOSIS — S43431A Superior glenoid labrum lesion of right shoulder, initial encounter: Secondary | ICD-10-CM | POA: Diagnosis not present

## 2018-12-03 DIAGNOSIS — M75121 Complete rotator cuff tear or rupture of right shoulder, not specified as traumatic: Secondary | ICD-10-CM | POA: Diagnosis not present

## 2018-12-03 DIAGNOSIS — Z01812 Encounter for preprocedural laboratory examination: Secondary | ICD-10-CM | POA: Insufficient documentation

## 2018-12-03 LAB — SARS CORONAVIRUS 2 (TAT 6-24 HRS): SARS Coronavirus 2: NEGATIVE

## 2018-12-03 NOTE — Telephone Encounter (Signed)
Patient called upset because he is supposed to be having surgery tomorrow and states that he has no instructions on where to go or what to do. I spoke with Myer Haff who had emailed instructions to patient as he had requested. I explained to patient that she had done this and he states that he has had some trouble getting into it. I advised patient per instructions emailed that he will report to the Main Entrance of the hospital at [facility] for 11:22a surgery, nothing to eat or drink after midnight,  have a driver, and not to take any valuables with him to the hospital. Patient expressed understanding.

## 2018-12-03 NOTE — Anesthesia Preprocedure Evaluation (Addendum)
Anesthesia Evaluation  Patient identified by MRN, date of birth, ID band Patient awake    Reviewed: Allergy & Precautions, NPO status , Patient's Chart, lab work & pertinent test results, reviewed documented beta blocker date and time   History of Anesthesia Complications (+) PONVNegative for: history of anesthetic complications  Airway Mallampati: II  TM Distance: >3 FB Neck ROM: Full    Dental  (+) Teeth Intact, Dental Advisory Given   Pulmonary neg pulmonary ROS,    Pulmonary exam normal        Cardiovascular hypertension, Pt. on medications and Pt. on home beta blockers + Past MI, +CHF and + DVT  Normal cardiovascular exam+ dysrhythmias Atrial Fibrillation      Neuro/Psych CVA, No Residual Symptoms negative psych ROS   GI/Hepatic negative GI ROS, Neg liver ROS,   Endo/Other  negative endocrine ROS  Renal/GU Renal InsufficiencyRenal disease  negative genitourinary   Musculoskeletal negative musculoskeletal ROS (+)   Abdominal   Peds  Hematology negative hematology ROS (+)   Anesthesia Other Findings low risk Myoview on 11/22/18 that showed inferior scar with minimal peri-infarct ischemia and mildly reduced left ventricular systolic function  Reproductive/Obstetrics                          Anesthesia Physical Anesthesia Plan  ASA: III  Anesthesia Plan: General   Post-op Pain Management: GA combined w/ Regional for post-op pain   Induction: Intravenous  PONV Risk Score and Plan: 3 and Ondansetron, Dexamethasone, Midazolam and Treatment may vary due to age or medical condition  Airway Management Planned: Oral ETT  Additional Equipment: None  Intra-op Plan:   Post-operative Plan: Extubation in OR  Informed Consent: I have reviewed the patients History and Physical, chart, labs and discussed the procedure including the risks, benefits and alternatives for the proposed anesthesia  with the patient or authorized representative who has indicated his/her understanding and acceptance.     Dental advisory given  Plan Discussed with:   Anesthesia Plan Comments: (Follows with cardiology for afib (not on anticoag due to recurrent GIB), HFrEF, and recent NSTEMI. NSTEMI in setting of GI bleed Dec 2019-Not cathed at the time and he was unable to do Myoview secondary to shoulder problems. Last seen by cardiology 11/09/18. 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 Followed by Dr Hollie Salk  Nuclear stress 11/22/18:  The left ventricular ejection fraction is mildly decreased (45-54%).  Nuclear stress EF: 51%.  Defect 1: There is a defect present in the basal inferior, basal inferolateral, mid inferior and mid inferolateral location.  Findings consistent with prior myocardial infarction with peri-infarct ischemia.  This is a low risk study.  There was no ST segment deviation noted during stress.  No T wave inversion was noted during stress.   Low risk stress nuclear study with inferior scar with minimal peri-infarct ischemia and mildly reduced left ventricular systolic function.  TTE 04/12/18: - Left ventricle: The cavity size was mildly dilated. Wall   thickness was increased in a pattern of moderate LVH. Systolic   function was mildly reduced. The estimated ejection fraction was   in the range of 45% to 50%. Features are consistent with a   pseudonormal left ventricular filling pattern, with  concomitant   abnormal relaxation and increased filling pressure (grade 2   diastolic dysfunction). Doppler parameters are consistent with   high ventricular filling pressure. - Regional wall motion abnormality: Akinesis of the basal-mid   inferior myocardium;  severe hypokinesis of the mid inferoseptal   myocardium; moderate hypokinesis of the basal inferoseptal,   apical inferior, mid inferolateral, and apical septal myocardium. - Aortic valve: Sclerosis without stenosis. There was no   regurgitation. Mean gradient (S): 3 mm Hg. - Mitral valve: Transvalvular velocity was within the normal range.   There was no evidence for stenosis. There was mild regurgitation,   posteriorly directed. Mild anterior leaflet override likely   secondary to posterior leaflet tethering. - Left atrium: The atrium was mildly dilated. - Right ventricle: The cavity size was normal. Wall thickness was   normal. Systolic function was normal. - Right atrium: The atrium was mildly dilated. Central venous   pressure (est): 15 mm Hg. - Atrial septum: No defect or patent foramen ovale was identified. - Tricuspid valve: There was trivial regurgitation. - Inferior vena cava: The vessel was dilated. The respirophasic   diameter changes were blunted (< 50%), consistent with elevated   central venous pressure. - Pericardium, extracardiac: A trivial pericardial effusion was   identified.  Impressions:  - Compared to the prior TTE exam of 12/12/2017, inferior and   inferoseptal wall motion abnormalities have worsened, EF has   decreased. Mitral regurgitation has increased. Side by side   comparison of images performed.)      Anesthesia Quick Evaluation

## 2018-12-04 ENCOUNTER — Ambulatory Visit (HOSPITAL_COMMUNITY)
Admission: RE | Admit: 2018-12-04 | Discharge: 2018-12-04 | Disposition: A | Discharge status: Home / Self Care | Payer: PPO | Attending: Orthopedic Surgery | Admitting: Orthopedic Surgery

## 2018-12-04 ENCOUNTER — Ambulatory Visit (HOSPITAL_COMMUNITY): Payer: PPO | Admitting: Physician Assistant

## 2018-12-04 ENCOUNTER — Encounter: Payer: Self-pay | Admitting: Orthopedic Surgery

## 2018-12-04 ENCOUNTER — Telehealth: Payer: Self-pay

## 2018-12-04 ENCOUNTER — Encounter (HOSPITAL_COMMUNITY)
Admission: RE | Disposition: A | Discharge status: Home / Self Care | Payer: Self-pay | Source: Home / Self Care | Attending: Orthopedic Surgery

## 2018-12-04 ENCOUNTER — Other Ambulatory Visit: Payer: Self-pay

## 2018-12-04 ENCOUNTER — Encounter (HOSPITAL_COMMUNITY): Payer: Self-pay | Admitting: Anesthesiology

## 2018-12-04 DIAGNOSIS — S43431D Superior glenoid labrum lesion of right shoulder, subsequent encounter: Secondary | ICD-10-CM

## 2018-12-04 DIAGNOSIS — Z8673 Personal history of transient ischemic attack (TIA), and cerebral infarction without residual deficits: Secondary | ICD-10-CM | POA: Diagnosis not present

## 2018-12-04 DIAGNOSIS — I252 Old myocardial infarction: Secondary | ICD-10-CM | POA: Diagnosis not present

## 2018-12-04 DIAGNOSIS — Z79899 Other long term (current) drug therapy: Secondary | ICD-10-CM | POA: Diagnosis not present

## 2018-12-04 DIAGNOSIS — E7800 Pure hypercholesterolemia, unspecified: Secondary | ICD-10-CM | POA: Insufficient documentation

## 2018-12-04 DIAGNOSIS — M75101 Unspecified rotator cuff tear or rupture of right shoulder, not specified as traumatic: Secondary | ICD-10-CM

## 2018-12-04 DIAGNOSIS — I129 Hypertensive chronic kidney disease with stage 1 through stage 4 chronic kidney disease, or unspecified chronic kidney disease: Secondary | ICD-10-CM | POA: Insufficient documentation

## 2018-12-04 DIAGNOSIS — N183 Chronic kidney disease, stage 3 (moderate): Secondary | ICD-10-CM | POA: Insufficient documentation

## 2018-12-04 DIAGNOSIS — Z86718 Personal history of other venous thrombosis and embolism: Secondary | ICD-10-CM | POA: Diagnosis not present

## 2018-12-04 DIAGNOSIS — S43431A Superior glenoid labrum lesion of right shoulder, initial encounter: Secondary | ICD-10-CM

## 2018-12-04 DIAGNOSIS — G8918 Other acute postprocedural pain: Secondary | ICD-10-CM | POA: Diagnosis not present

## 2018-12-04 DIAGNOSIS — M7521 Bicipital tendinitis, right shoulder: Secondary | ICD-10-CM

## 2018-12-04 HISTORY — PX: SHOULDER ARTHROSCOPY WITH SUBACROMIAL DECOMPRESSION, ROTATOR CUFF REPAIR AND BICEP TENDON REPAIR: SHX5687

## 2018-12-04 HISTORY — DX: Other specified postprocedural states: Z98.890

## 2018-12-04 HISTORY — DX: Nausea with vomiting, unspecified: R11.2

## 2018-12-04 LAB — CBC
HCT: 41.7 % (ref 39.0–52.0)
Hemoglobin: 13.5 g/dL (ref 13.0–17.0)
MCH: 29.7 pg (ref 26.0–34.0)
MCHC: 32.4 g/dL (ref 30.0–36.0)
MCV: 91.6 fL (ref 80.0–100.0)
Platelets: 191 10*3/uL (ref 150–400)
RBC: 4.55 MIL/uL (ref 4.22–5.81)
RDW: 15.9 % — ABNORMAL HIGH (ref 11.5–15.5)
WBC: 9.3 10*3/uL (ref 4.0–10.5)
nRBC: 0 % (ref 0.0–0.2)

## 2018-12-04 LAB — BASIC METABOLIC PANEL
Anion gap: 13 (ref 5–15)
BUN: 38 mg/dL — ABNORMAL HIGH (ref 8–23)
CO2: 19 mmol/L — ABNORMAL LOW (ref 22–32)
Calcium: 8.8 mg/dL — ABNORMAL LOW (ref 8.9–10.3)
Chloride: 113 mmol/L — ABNORMAL HIGH (ref 98–111)
Creatinine, Ser: 2.73 mg/dL — ABNORMAL HIGH (ref 0.61–1.24)
GFR calc Af Amer: 27 mL/min — ABNORMAL LOW (ref >60)
GFR calc non Af Amer: 23 mL/min — ABNORMAL LOW (ref >60)
Glucose, Bld: 94 mg/dL (ref 70–99)
Potassium: 3.3 mmol/L — ABNORMAL LOW (ref 3.5–5.1)
Sodium: 145 mmol/L (ref 135–145)

## 2018-12-04 SURGERY — SHOULDER ARTHROSCOPY WITH SUBACROMIAL DECOMPRESSION, ROTATOR CUFF REPAIR AND BICEP TENDON REPAIR
Anesthesia: General | Site: Shoulder | Laterality: Right

## 2018-12-04 MED ORDER — CHLORHEXIDINE GLUCONATE 4 % EX LIQD: 60.0000 mL | Freq: Once | CUTANEOUS | Status: DC

## 2018-12-04 MED ORDER — LIDOCAINE 2% (20 MG/ML) 5 ML SYRINGE
INTRAMUSCULAR | Status: DC | PRN
  Administered 2018-12-04: 60 mg via INTRAVENOUS

## 2018-12-04 MED ORDER — ALBUMIN HUMAN 5 % IV SOLN
INTRAVENOUS | Status: DC | PRN
  Administered 2018-12-04: 16:00:00 via INTRAVENOUS

## 2018-12-04 MED ORDER — ASPIRIN EC 81 MG PO TBEC: 81.0000 mg | DELAYED_RELEASE_TABLET | Freq: Every day | ORAL | 0 refills | Status: DC

## 2018-12-04 MED ORDER — LACTATED RINGERS IV SOLN
INTRAVENOUS | Status: DC | PRN
  Administered 2018-12-04: 10:00:00 via INTRAVENOUS

## 2018-12-04 MED ORDER — MIDAZOLAM HCL 2 MG/2ML IJ SOLN
2.0000 mg | Freq: Once | INTRAMUSCULAR | Status: AC
  Administered 2018-12-04: 2 mg via INTRAVENOUS

## 2018-12-04 MED ORDER — DEXAMETHASONE SODIUM PHOSPHATE 10 MG/ML IJ SOLN
INTRAMUSCULAR | Status: DC | PRN
  Administered 2018-12-04: 10 mg via INTRAVENOUS

## 2018-12-04 MED ORDER — GLYCOPYRROLATE PF 0.2 MG/ML IJ SOSY
PREFILLED_SYRINGE | INTRAMUSCULAR | Status: AC
  Filled 2018-12-04: qty 1

## 2018-12-04 MED ORDER — FENTANYL CITRATE (PF) 100 MCG/2ML IJ SOLN: 25.0000 ug | INTRAMUSCULAR | Status: DC | PRN

## 2018-12-04 MED ORDER — ONDANSETRON HCL 4 MG/2ML IJ SOLN: 4.0000 mg | Freq: Once | INTRAMUSCULAR | Status: DC | PRN

## 2018-12-04 MED ORDER — OXYCODONE HCL 5 MG/5ML PO SOLN: 5.0000 mg | Freq: Once | ORAL | Status: DC | PRN

## 2018-12-04 MED ORDER — SODIUM CHLORIDE 0.9 % IR SOLN
Status: DC | PRN
  Administered 2018-12-04: 3000 mL

## 2018-12-04 MED ORDER — MIDAZOLAM HCL 2 MG/2ML IJ SOLN
INTRAMUSCULAR | Status: AC
  Administered 2018-12-04: 2 mg via INTRAVENOUS
  Filled 2018-12-04: qty 2

## 2018-12-04 MED ORDER — DEXAMETHASONE SODIUM PHOSPHATE 10 MG/ML IJ SOLN
INTRAMUSCULAR | Status: AC
  Filled 2018-12-04: qty 1

## 2018-12-04 MED ORDER — PROPOFOL 10 MG/ML IV BOLUS
INTRAVENOUS | Status: AC
  Filled 2018-12-04: qty 20

## 2018-12-04 MED ORDER — 0.9 % SODIUM CHLORIDE (POUR BTL) OPTIME
TOPICAL | Status: DC | PRN
  Administered 2018-12-04 (×3): 1000 mL

## 2018-12-04 MED ORDER — METHOCARBAMOL 500 MG PO TABS: 500.0000 mg | ORAL_TABLET | Freq: Three times a day (TID) | ORAL | 0 refills | Status: DC | PRN

## 2018-12-04 MED ORDER — FENTANYL CITRATE (PF) 100 MCG/2ML IJ SOLN
50.0000 ug | Freq: Once | INTRAMUSCULAR | Status: AC
  Administered 2018-12-04: 50 ug via INTRAVENOUS

## 2018-12-04 MED ORDER — ONDANSETRON HCL 4 MG/2ML IJ SOLN
INTRAMUSCULAR | Status: DC | PRN
  Administered 2018-12-04: 4 mg via INTRAVENOUS

## 2018-12-04 MED ORDER — SODIUM CHLORIDE 0.9 % IV SOLN
INTRAVENOUS | Status: DC | PRN
  Administered 2018-12-04: 25 ug/min via INTRAVENOUS

## 2018-12-04 MED ORDER — OXYCODONE HCL 5 MG PO TABS: 5.0000 mg | ORAL_TABLET | Freq: Once | ORAL | Status: DC | PRN

## 2018-12-04 MED ORDER — PROPOFOL 10 MG/ML IV BOLUS
INTRAVENOUS | Status: DC | PRN
  Administered 2018-12-04: 140 mg via INTRAVENOUS

## 2018-12-04 MED ORDER — ROCURONIUM BROMIDE 10 MG/ML (PF) SYRINGE
PREFILLED_SYRINGE | INTRAVENOUS | Status: DC | PRN
  Administered 2018-12-04: 60 mg via INTRAVENOUS

## 2018-12-04 MED ORDER — BUPIVACAINE HCL (PF) 0.5 % IJ SOLN
INTRAMUSCULAR | Status: DC | PRN
  Administered 2018-12-04: 15 mL via PERINEURAL

## 2018-12-04 MED ORDER — OXYCODONE HCL 5 MG PO TABS: 5.0000 mg | ORAL_TABLET | Freq: Four times a day (QID) | ORAL | 0 refills | Status: DC | PRN

## 2018-12-04 MED ORDER — SODIUM CHLORIDE 0.9 % IV SOLN
INTRAVENOUS | Status: DC
  Administered 2018-12-04 (×2): via INTRAVENOUS

## 2018-12-04 MED ORDER — CEFAZOLIN SODIUM-DEXTROSE 2-4 GM/100ML-% IV SOLN
INTRAVENOUS | Status: AC
  Filled 2018-12-04: qty 100

## 2018-12-04 MED ORDER — BUPIVACAINE LIPOSOME 1.3 % IJ SUSP
INTRAMUSCULAR | Status: DC | PRN
  Administered 2018-12-04: 10 mL

## 2018-12-04 MED ORDER — CEFAZOLIN SODIUM-DEXTROSE 2-4 GM/100ML-% IV SOLN
2.0000 g | INTRAVENOUS | Status: AC
  Administered 2018-12-04: 2 g via INTRAVENOUS

## 2018-12-04 MED ORDER — GLYCOPYRROLATE 0.2 MG/ML IJ SOLN
INTRAMUSCULAR | Status: DC | PRN
  Administered 2018-12-04: 0.2 mg via INTRAVENOUS

## 2018-12-04 MED ORDER — LIDOCAINE 2% (20 MG/ML) 5 ML SYRINGE
INTRAMUSCULAR | Status: AC
  Filled 2018-12-04: qty 5

## 2018-12-04 MED ORDER — SUGAMMADEX SODIUM 200 MG/2ML IV SOLN
INTRAVENOUS | Status: DC | PRN
  Administered 2018-12-04: 300 mg via INTRAVENOUS

## 2018-12-04 MED ORDER — FENTANYL CITRATE (PF) 250 MCG/5ML IJ SOLN
INTRAMUSCULAR | Status: AC
  Filled 2018-12-04: qty 5

## 2018-12-04 MED ORDER — FENTANYL CITRATE (PF) 100 MCG/2ML IJ SOLN
INTRAMUSCULAR | Status: AC
  Administered 2018-12-04: 50 ug via INTRAVENOUS
  Filled 2018-12-04: qty 2

## 2018-12-04 MED ORDER — ONDANSETRON HCL 4 MG/2ML IJ SOLN
INTRAMUSCULAR | Status: AC
  Filled 2018-12-04: qty 2

## 2018-12-04 MED ORDER — PHENYLEPHRINE HCL (PRESSORS) 10 MG/ML IV SOLN
INTRAVENOUS | Status: AC
  Filled 2018-12-04: qty 1

## 2018-12-04 SURGICAL SUPPLY — 73 items
AID PSTN UNV HD RSTRNT DISP (MISCELLANEOUS) ×1
ANCH SUT CRKSW FT 1.3X (Anchor) ×2 IMPLANT
ANCH SUT PUSHLCK 24X4.5 STRL (Orthopedic Implant) ×2 IMPLANT
ANCH SUT SWLK 19.1X6.25 CLS (Anchor) ×1 IMPLANT
ANCHOR SUT BIOCOMP CORKSREW (Anchor) ×2 IMPLANT
ANCHOR SUT SWIVELLOK BIO (Anchor) ×1 IMPLANT
BLADE EXCALIBUR 4.0X13 (MISCELLANEOUS) ×2 IMPLANT
BLADE SURG 11 STRL SS (BLADE) IMPLANT
BURR OVAL 8 FLU 4.0X13 (MISCELLANEOUS) IMPLANT
COVER SURGICAL LIGHT HANDLE (MISCELLANEOUS) ×2 IMPLANT
COVER WAND RF STERILE (DRAPES) IMPLANT
DRAPE INCISE IOBAN 66X45 STRL (DRAPES) ×4 IMPLANT
DRAPE STERI 35X30 U-POUCH (DRAPES) ×2 IMPLANT
DRAPE U-SHAPE 47X51 STRL (DRAPES) ×4 IMPLANT
DRSG AQUACEL AG ADV 3.5X 6 (GAUZE/BANDAGES/DRESSINGS) ×1 IMPLANT
DRSG AQUACEL AG ADV 3.5X10 (GAUZE/BANDAGES/DRESSINGS) ×1 IMPLANT
DRSG TEGADERM 4X4.75 (GAUZE/BANDAGES/DRESSINGS) ×5 IMPLANT
DURAPREP 26ML APPLICATOR (WOUND CARE) ×1 IMPLANT
DW OUTFLOW CASSETTE/TUBE SET (MISCELLANEOUS) ×2 IMPLANT
ELECT REM PT RETURN 9FT ADLT (ELECTROSURGICAL) ×2
ELECTRODE REM PT RTRN 9FT ADLT (ELECTROSURGICAL) ×1 IMPLANT
GAUZE SPONGE 4X4 12PLY STRL (GAUZE/BANDAGES/DRESSINGS) ×2 IMPLANT
GAUZE SPONGE 4X4 12PLY STRL LF (GAUZE/BANDAGES/DRESSINGS) ×2 IMPLANT
GAUZE XEROFORM 1X8 LF (GAUZE/BANDAGES/DRESSINGS) ×1 IMPLANT
GLOVE BIO SURGEON STRL SZ 6.5 (GLOVE) ×2 IMPLANT
GLOVE BIO SURGEON STRL SZ7 (GLOVE) ×1 IMPLANT
GLOVE BIOGEL PI IND STRL 7.5 (GLOVE) ×1 IMPLANT
GLOVE BIOGEL PI IND STRL 8 (GLOVE) ×1 IMPLANT
GLOVE BIOGEL PI INDICATOR 7.5 (GLOVE) ×1
GLOVE BIOGEL PI INDICATOR 8 (GLOVE) ×1
GLOVE ECLIPSE 7.0 STRL STRAW (GLOVE) ×2 IMPLANT
GLOVE SURG ORTHO 8.0 STRL STRW (GLOVE) ×2 IMPLANT
GOWN STRL REUS W/ TWL LRG LVL3 (GOWN DISPOSABLE) ×3 IMPLANT
GOWN STRL REUS W/TWL LRG LVL3 (GOWN DISPOSABLE) ×8
KIT BASIN OR (CUSTOM PROCEDURE TRAY) ×2 IMPLANT
KIT BIO-TENODESIS 3X8 DISP (MISCELLANEOUS) ×2
KIT INSRT BABSR STRL DISP BTN (MISCELLANEOUS) IMPLANT
KIT TURNOVER KIT B (KITS) ×2 IMPLANT
MANIFOLD NEPTUNE II (INSTRUMENTS) ×2 IMPLANT
NDL SCORPION MULTI FIRE (NEEDLE) IMPLANT
NDL SPNL 18GX3.5 QUINCKE PK (NEEDLE) ×1 IMPLANT
NDL SUT 6 .5 CRC .975X.05 MAYO (NEEDLE) IMPLANT
NEEDLE MAYO TAPER (NEEDLE)
NEEDLE SCORPION MULTI FIRE (NEEDLE) ×2 IMPLANT
NEEDLE SPNL 18GX3.5 QUINCKE PK (NEEDLE) ×2 IMPLANT
NS IRRIG 1000ML POUR BTL (IV SOLUTION) ×2 IMPLANT
PACK SHOULDER (CUSTOM PROCEDURE TRAY) ×2 IMPLANT
PAD ARMBOARD 7.5X6 YLW CONV (MISCELLANEOUS) ×4 IMPLANT
PROBE APOLLO 90XL (SURGICAL WAND) ×1 IMPLANT
PUSHLOCK PEEK 4.5X24 (Orthopedic Implant) ×2 IMPLANT
RESTRAINT HEAD UNIVERSAL NS (MISCELLANEOUS) ×2 IMPLANT
SLING ARM IMMOBILIZER LRG (SOFTGOODS) IMPLANT
SPONGE LAP 4X18 RFD (DISPOSABLE) ×4 IMPLANT
STRIP CLOSURE SKIN 1/2X4 (GAUZE/BANDAGES/DRESSINGS) ×2 IMPLANT
SUCTION FRAZIER HANDLE 10FR (MISCELLANEOUS) ×1
SUCTION TUBE FRAZIER 10FR DISP (MISCELLANEOUS) ×1 IMPLANT
SUT ETHILON 3 0 PS 1 (SUTURE) ×3 IMPLANT
SUT FIBERWIRE #2 38 T-5 BLUE (SUTURE)
SUT MNCRL AB 3-0 PS2 18 (SUTURE) ×2 IMPLANT
SUT VIC AB 0 CT1 27 (SUTURE) ×4
SUT VIC AB 0 CT1 27XBRD ANBCTR (SUTURE) ×1 IMPLANT
SUT VIC AB 1 CT1 27 (SUTURE) ×2
SUT VIC AB 1 CT1 27XBRD ANBCTR (SUTURE) IMPLANT
SUT VIC AB 2-0 CT1 27 (SUTURE) ×4
SUT VIC AB 2-0 CT1 TAPERPNT 27 (SUTURE) ×1 IMPLANT
SUT VICRYL 0 UR6 27IN ABS (SUTURE) ×9 IMPLANT
SUTURE FIBERWR #2 38 T-5 BLUE (SUTURE) IMPLANT
SUTURE TAPE 1.3 FIBERLOP 20 ST (SUTURE) IMPLANT
SUTURETAPE 1.3 FIBERLOOP 20 ST (SUTURE)
TOWEL GREEN STERILE (TOWEL DISPOSABLE) ×2 IMPLANT
TOWEL GREEN STERILE FF (TOWEL DISPOSABLE) ×2 IMPLANT
TUBING ARTHROSCOPY IRRIG 16FT (MISCELLANEOUS) ×2 IMPLANT
WATER STERILE IRR 1000ML POUR (IV SOLUTION) ×2 IMPLANT

## 2018-12-04 NOTE — Transfer of Care (Signed)
Immediate Anesthesia Transfer of Care Note  Patient: Billy [name]  Procedure(s) Performed: RIGHT SHOULDER ARTHROSCOPY, DEBRIDEMENT, MINI OPEN ROTATOR CUFF TEAR REPAIR (Right Shoulder)  Patient Location: PACU  Anesthesia Type:General  Level of Consciousness: awake, alert  and drowsy  Airway & Oxygen Therapy: Patient Spontanous Breathing and Patient connected to nasal cannula oxygen  Post-op Assessment: Report given to RN and Post -op Vital signs reviewed and stable  Post vital signs: Reviewed and stable  Last Vitals:  Vitals Value Taken Time  BP 158/76 12/04/18 1701  Temp    Pulse 73 12/04/18 1706  Resp 17 12/04/18 1706  SpO2 99 % 12/04/18 1706  Vitals shown include unvalidated device data.  Last Pain:  Vitals:   12/04/18 1030  PainSc: 0-No pain      Patients Stated Pain Goal: 3 (56/25/63 8937)  Complications: No apparent anesthesia complications

## 2018-12-04 NOTE — Op Note (Signed)
NAME: Resor, [facility] MEDICAL RECORD [id number] ACCOUNT [id number] DATE OF BIRTH:Dec 22, 1950 FACILITY: [facility] LOCATION: [facility] PHYSICIAN:Jamirah Zelaya Randel Pigg, MD  OPERATIVE REPORT  DATE OF PROCEDURE:  12/04/2018  PREOPERATIVE DIAGNOSIS:  Right shoulder rotator cuff tear and superior labral tear.  POSTOPERATIVE DIAGNOSIS:  Right shoulder rotator cuff tear and superior labral tear.  PROCEDURE:  Right shoulder arthroscopy with limited debridement, release of biceps tendon with mini open rotator cuff tear repair of a retracted supraspinatus tendon tear as well as open biceps tenodesis.  SURGEON:  Meredith Pel, MD  ASSISTANT:  Annie Main, PA.  INDICATIONS:  The patient is a 68 year old patient with right shoulder pain, who presents for operative management after explanation of risks and benefits.  PROCEDURE IN DETAIL:  The patient was brought to the operating room where general anesthetic was induced.  Preoperative antibiotics administered.  Timeout was called.  Right shoulder was examined under anesthesia and found to have some degree of  stiffness consistent with early frozen shoulder.  The patient was manipulated from 150 degrees of forward flexion to 180 of forward flexion.  The patient had isolated glenohumeral abduction of about 90 degrees and we took him up to about 110.  External  rotation improved from 45-60 after the rotator interval release.  The patient's head was in neutral position.  Right shoulder prescrubbed with alcohol and Betadine, allowed to air dry.  Prep with DuraPrep solution and draped in a sterile manner.   Time-out was called.  Posterior portal was created 2 cm medial and inferior to the posterolateral margin of acromion.  Diagnostic arthroscopy demonstrated definite superior labral type tear with degenerative fraying of the base of the biceps anchor along  with some biceps tendinitis and synovitis within the rotator interval.  Rotator interval was released, which  increased external rotation from 45-60.  Rotator cuff tear, supraspinatus was identified.  Coracoacromial ligament and rotator interval release  was performed.  The coracohumeral ligament release and rotator interval release was performed.  Glenohumeral articular surfaces were intact.  Infraspinatus was intact.  Anterior, inferior, posterior inferior glenohumeral ligaments were intact.  After  releasing the biceps tendon and debriding the superior labrum as well as the rotator interval release, the instruments were removed from the portals, which were closed using 3-0 nylon.  Charlie Pitter was covering the entire operative field at this time.   Incision made off the anterolateral margin of the acromion.  Skin and subcutaneous tissue were sharply divided.  Bursectomy performed.  Subacromial decompression performed.  Biceps was tenodesed into the bicipital groove using Arthrex 6 mm SwiveLock.   This gave a good fixation under appropriate tension.  At this time, attention was directed towards rotator cuff tear.  Progressive mobilization was performed and the tension sutures were placed of 0 Vicryl at the end of the rotator cuff tendon x4.  With  mobilization above and below the rotator cuff tendon, we were able to mobilize that tendon back to near its footprint attachment.  The footprint was prepared.  Two corkscrews were placed and the 4 suture tapes from each corkscrew were placed through the  tendon edge and then they were tied.  The ends were crossed and then with the Vicryl sutures, 2 Arthrex PushLocks were utilized to secure in a near watertight fashion the repair.  Rotator interval was then closed using 0 Vicryl suture.  The interval  between the infraspinatus and supraspinatus also closed using 0 Vicryl suture.  Thorough irrigation was then performed.  The self-retaining retractor was  removed.  Deltoid split closed using #1 Vicryl suture followed by interrupted inverted 0 Vicryl  suture, 2-0 Vicryl suture and a  3-0 Monocryl.  Impervious dressings and a special brace were applied.  The patient tolerated the procedure well without immediate complications.  He was transferred to the recovery room in stable condition.  TN/NUANCE  D:12/04/2018 T:12/04/2018 JOB:[id number]

## 2018-12-04 NOTE — Anesthesia Procedure Notes (Signed)
Anesthesia Regional Block: Interscalene brachial plexus block   Pre-Anesthetic Checklist: ,, timeout performed, Correct Patient, Correct Site, Correct Laterality, Correct Procedure, Correct Position, site marked, Risks and benefits discussed,  Surgical consent,  Pre-op evaluation,  At surgeon's request and post-op pain management  Laterality: Right  Prep: chloraprep       Needles:  Injection technique: Single-shot  Needle Type: Echogenic Stimulator Needle     Needle Length: 9cm  Needle Gauge: 21     Additional Needles:   Procedures:,,,, ultrasound used (permanent image in chart),,,,  Narrative:  Start time: 12/04/2018 10:20 AM End time: 12/04/2018 10:24 AM Injection made incrementally with aspirations every 5 mL.  Performed by: Personally  Anesthesiologist: Lidia Collum, MD  Additional Notes: Monitors applied. Injection made in 5cc increments. No resistance to injection. Good needle visualization. Patient tolerated procedure well.
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
## 2018-12-04 NOTE — Anesthesia Postprocedure Evaluation (Signed)
Anesthesia Post Note  Patient: Billy [name]  Procedure(s) Performed: RIGHT SHOULDER ARTHROSCOPY, DEBRIDEMENT, MINI OPEN ROTATOR CUFF TEAR REPAIR (Right Shoulder)     Patient location during evaluation: PACU Anesthesia Type: General Level of consciousness: awake and alert and oriented Pain management: pain level controlled Vital Signs Assessment: post-procedure vital signs reviewed and stable Respiratory status: spontaneous breathing, nonlabored ventilation and respiratory function stable Cardiovascular status: blood pressure returned to baseline and stable Postop Assessment: no apparent nausea or vomiting Anesthetic complications: no    Last Vitals:  Vitals:   12/04/18 1810 12/04/18 1815  BP: (!) 171/82 (!) 145/90  Pulse: 67 70  Resp: 17 17  Temp:  (!) 36.3 C  SpO2: 93% 96%    Last Pain:  Vitals:   12/04/18 1810  PainSc: 0-No pain                 Lexington Krotz A.

## 2018-12-04 NOTE — Telephone Encounter (Signed)
Spoke with pt who states he is currently admitted at [facility]. Informed pt that appt on 9/8 at 11:00am will be moved up to 10:30am. Pt agreeable but requests that an email be sent to him at [email or web address] to update. Secure email sent to pt with updated appt info on 8/18

## 2018-12-04 NOTE — Brief Op Note (Signed)
   12/04/2018  4:38 PM  PATIENT:  Billy [name]  68 y.o. male  PRE-OPERATIVE DIAGNOSIS:  right shouler slapt tear and rotator cuff tear  POST-OPERATIVE DIAGNOSIS:  right shouler slapt tear and rotator cuff tear  PROCEDURE:  Procedure(s): RIGHT SHOULDER ARTHROSCOPY, DEBRIDEMENT, MINI OPEN ROTATOR CUFF TEAR REPAIR biceps tenodesis  SURGEON:  Surgeon(s): Meredith Pel, MD  ASSISTANT: Magnant pa  ANESTHESIA:   general  EBL: 15 ml    Total I/O In: 550 [I.V.:300; IV Piggyback:250] Out: 525 [Urine:450; Blood:75]  BLOOD ADMINISTERED: none  DRAINS: none   LOCAL MEDICATIONS USED:  none  SPECIMEN:  No Specimen  COUNTS:  YES  TOURNIQUET:  * No tourniquets in log *  DICTATION: .Other Dictation: Dictation Number [id number]  PLAN OF CARE: Discharge to home after PACU  PATIENT DISPOSITION:  PACU - hemodynamically stable

## 2018-12-04 NOTE — Anesthesia Procedure Notes (Signed)
Procedure Name: Intubation Date/Time: 12/04/2018 2:15 PM Performed by: Jenne Campus, CRNA Pre-anesthesia Checklist: Patient identified, Emergency Drugs available, Suction available and Patient being monitored Patient Re-evaluated:Patient Re-evaluated prior to induction Oxygen Delivery Method: Circle System Utilized Preoxygenation: Pre-oxygenation with 100% oxygen Induction Type: IV induction Ventilation: Mask ventilation without difficulty Laryngoscope Size: Miller and 3 Grade View: Grade I Tube type: Oral Tube size: 8.0 mm Number of attempts: 1 Airway Equipment and Method: Stylet and Oral airway Placement Confirmation: ETT inserted through vocal cords under direct vision,  positive ETCO2 and breath sounds checked- equal and bilateral Secured at: 21 cm Tube secured with: Tape Dental Injury: Teeth and Oropharynx as per pre-operative assessment

## 2018-12-05 ENCOUNTER — Ambulatory Visit (HOSPITAL_COMMUNITY): Admission: RE | Admit: 2018-12-05 | Payer: PPO | Source: Ambulatory Visit

## 2018-12-05 ENCOUNTER — Encounter (HOSPITAL_COMMUNITY): Payer: Self-pay | Admitting: Orthopedic Surgery

## 2018-12-07 ENCOUNTER — Telehealth: Payer: Self-pay | Admitting: Orthopedic Surgery

## 2018-12-07 ENCOUNTER — Telehealth: Payer: Self-pay

## 2018-12-07 ENCOUNTER — Encounter (HOSPITAL_COMMUNITY): Payer: Self-pay | Admitting: Orthopedic Surgery

## 2018-12-07 DIAGNOSIS — M7521 Bicipital tendinitis, right shoulder: Secondary | ICD-10-CM

## 2018-12-07 DIAGNOSIS — S43431A Superior glenoid labrum lesion of right shoulder, initial encounter: Secondary | ICD-10-CM

## 2018-12-07 DIAGNOSIS — M75101 Unspecified rotator cuff tear or rupture of right shoulder, not specified as traumatic: Secondary | ICD-10-CM

## 2018-12-07 NOTE — Telephone Encounter (Signed)
Please advise. Thanks.  

## 2018-12-07 NOTE — Telephone Encounter (Signed)
Yes   normal    big tear     will be stiff

## 2018-12-07 NOTE — Telephone Encounter (Signed)
Patient called stating that when he woke up this morning, his right shoulder was very stiff.  Would like to know if this is normal?  Stated that he has been doing his therapy and taking his pain medicine, but has not over used his right shoulder.  Patient had right shoulder surgery on Tuesday, 12/04/2018.  CB# is [phone].  Please advise.  Thank you.

## 2018-12-10 ENCOUNTER — Ambulatory Visit: Payer: PPO | Admitting: Cardiology

## 2018-12-10 ENCOUNTER — Inpatient Hospital Stay: Payer: PPO | Admitting: Orthopedic Surgery

## 2018-12-10 ENCOUNTER — Telehealth: Payer: Self-pay | Admitting: Orthopedic Surgery

## 2018-12-10 NOTE — Telephone Encounter (Signed)
Patient called stating that the shoulder Dr. Marlou Sa operated on is freezing up on him.  He would like someone to give him a call back.  CB#[phone].  Thank you.

## 2018-12-10 NOTE — Telephone Encounter (Signed)
I called patient to discuss per Dr Marlou Sa, no answer. LMVM advising per Dr Marlou Sa. Advised we could work him in on Wednesday if needed.

## 2018-12-10 NOTE — Telephone Encounter (Signed)
See other note

## 2018-12-12 ENCOUNTER — Ambulatory Visit (INDEPENDENT_AMBULATORY_CARE_PROVIDER_SITE_OTHER): Payer: PPO | Admitting: *Deleted

## 2018-12-12 ENCOUNTER — Encounter: Payer: Self-pay | Admitting: Orthopedic Surgery

## 2018-12-12 ENCOUNTER — Ambulatory Visit (INDEPENDENT_AMBULATORY_CARE_PROVIDER_SITE_OTHER): Payer: PPO | Admitting: Orthopedic Surgery

## 2018-12-12 DIAGNOSIS — I6340 Cerebral infarction due to embolism of unspecified cerebral artery: Secondary | ICD-10-CM | POA: Diagnosis not present

## 2018-12-12 DIAGNOSIS — S46011D Strain of muscle(s) and tendon(s) of the rotator cuff of right shoulder, subsequent encounter: Secondary | ICD-10-CM

## 2018-12-12 MED ORDER — HYDROCODONE-ACETAMINOPHEN 10-325 MG PO TABS: 1.0000 | ORAL_TABLET | Freq: Four times a day (QID) | ORAL | 0 refills | Status: DC | PRN

## 2018-12-12 MED ORDER — ONDANSETRON HCL 4 MG PO TABS: ORAL_TABLET | ORAL | 0 refills | Status: DC

## 2018-12-12 MED ORDER — METHOCARBAMOL 500 MG PO TABS: 500.0000 mg | ORAL_TABLET | Freq: Three times a day (TID) | ORAL | 0 refills | Status: DC | PRN

## 2018-12-13 LAB — CUP PACEART REMOTE DEVICE CHECK
Date Time Interrogation Session: 20200827004044
Implantable Pulse Generator Implant Date: 20190829

## 2018-12-14 ENCOUNTER — Encounter: Payer: Self-pay | Admitting: Orthopedic Surgery
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
## 2018-12-17 ENCOUNTER — Telehealth: Payer: Self-pay | Admitting: Orthopedic Surgery

## 2018-12-17 NOTE — Telephone Encounter (Signed)
Tried calling patient. No answer. LMVM with Ruby Cola from [place] number of [phone]

## 2018-12-17 NOTE — Telephone Encounter (Signed)
Patient called requesting the name of the gentlemen that has his devices sent to his house.  He is have difficulty adjusting the sling.  He is wanting to have that gentlemen come to his house and show him how to adjust it.  He does not have the number.  CB#[phone].  Thank you.

## 2018-12-19 ENCOUNTER — Telehealth: Payer: Self-pay | Admitting: Orthopedic Surgery

## 2018-12-19 NOTE — Telephone Encounter (Signed)
I sent Ruby Cola message asking him to call patient. I also called patient LM  and advised him of this.

## 2018-12-19 NOTE — Telephone Encounter (Signed)
Patient called and stated that he has tried to call Ruby Cola for 2 days and there is no answer. Wants Dr.Dean or someone from his office to call Ruby Cola and ask him to call him.  Please call patient @ [phone]

## 2018-12-20 NOTE — Progress Notes (Signed)
Carelink Summary Report / Loop Recorder 

## 2018-12-21 ENCOUNTER — Encounter: Payer: Self-pay | Admitting: Cardiovascular Disease

## 2018-12-22 ENCOUNTER — Other Ambulatory Visit: Payer: Self-pay | Admitting: Surgical

## 2018-12-22 ENCOUNTER — Other Ambulatory Visit: Payer: Self-pay | Admitting: Cardiovascular Disease

## 2018-12-22 MED ORDER — HYDROCODONE-ACETAMINOPHEN 10-325 MG PO TABS: 1.0000 | ORAL_TABLET | Freq: Four times a day (QID) | ORAL | 0 refills | Status: DC | PRN

## 2018-12-25 ENCOUNTER — Ambulatory Visit (INDEPENDENT_AMBULATORY_CARE_PROVIDER_SITE_OTHER): Payer: PPO | Admitting: Cardiovascular Disease

## 2018-12-25 ENCOUNTER — Encounter: Payer: Self-pay | Admitting: Cardiovascular Disease

## 2018-12-25 ENCOUNTER — Other Ambulatory Visit: Payer: Self-pay

## 2018-12-25 VITALS — BP 140/71 | HR 60 | Ht 74.0 in | Wt 174.2 lb

## 2018-12-25 DIAGNOSIS — I252 Old myocardial infarction: Secondary | ICD-10-CM | POA: Diagnosis not present

## 2018-12-25 DIAGNOSIS — I10 Essential (primary) hypertension: Secondary | ICD-10-CM

## 2018-12-25 DIAGNOSIS — I480 Paroxysmal atrial fibrillation: Secondary | ICD-10-CM | POA: Diagnosis not present

## 2018-12-25 DIAGNOSIS — I6523 Occlusion and stenosis of bilateral carotid arteries: Secondary | ICD-10-CM | POA: Diagnosis not present

## 2018-12-25 NOTE — Assessment & Plan Note (Signed)
History of bilateral moderate ICA stenosis by duplex ultrasound performed 12/12/2017 which we will repeat.

## 2018-12-25 NOTE — Assessment & Plan Note (Signed)
History of essential hypertension with blood pressure measured today 140/71.  He is on amlodipine, carvedilol and hydralazine.  Does complain of some leg swelling after beginning amlodipine although with renal insufficiency he is not a candidate for an ACE/ARB.

## 2018-12-25 NOTE — Patient Instructions (Signed)
Medication Instructions:  Your physician recommends that you continue on your current medications as directed. Please refer to the Current Medication list given to you today.  If you need a refill on your cardiac medications before your next appointment, please call your pharmacy.   Lab work: NONE If you have labs (blood work) drawn today and your tests are completely normal, you will receive your results only by: Marland Kitchen MyChart Message (if you have MyChart) OR . A paper copy in the mail If you have any lab test that is abnormal or we need to change your treatment, we will call you to review the results.  Testing/Procedures: Your physician has requested that you have a carotid duplex. This test is an ultrasound of the carotid arteries in your neck. It looks at blood flow through these arteries that supply the brain with blood. Allow one hour for this exam. There are no restrictions or special instructions. TO BE SCHEDULED   Follow-Up: At [facility], you and your health needs are our priority.  As part of our continuing mission to provide you with exceptional heart care, we have created designated Provider Care Teams.  These Care Teams include your primary Cardiologist (physician) and Advanced Practice Providers (APPs -  Physician Assistants and Nurse Practitioners) who all work together to provide you with the care you need, when you need it. . You will need a follow up appointment in 6 months with AN APP AND IN 12 MONTHS WITH Dr. Quay Burow.  Please call our office 2 months in advance to schedule this/each appointment.  You may see one of the following Advanced Practice Providers on your designated Care Team:   . Kerin Ransom, PA-C . Daleen Snook Kroeger, PA-C . Sande Rives, PA-C . Almyra Deforest, PA-C . Fabian Sharp, PA-C . Jory Sims, DNP . Rosaria Ferries, PA-C

## 2018-12-25 NOTE — Assessment & Plan Note (Signed)
History of non-ST segment elevation myocardial infarction with a peak troponin of 17 in the past however a cardiac apposition was not pursued because of GI bleeding and moderate renal insufficiency.  Myoview stress test was ordered but never obtained.  He denies chest pain or shortness of breath.

## 2018-12-25 NOTE — Assessment & Plan Note (Signed)
History of prior stroke and PAF found on Linq monitoring.  He was on Eliquis oral anticoagulation which was stopped last December because of got gastric bleeding found on endoscopy with it ulcer.  It is elected to hold his Eliquis at that time.  He did have transfusion of blood after he hemoglobin in April was 7.3.  Is currently 13.5.

## 2018-12-25 NOTE — Assessment & Plan Note (Signed)
History of hyperlipidemia on statin therapy with lipid profile performed 04/27/2018 revealing total cholesterol 129, LDL 62 and HDL 55.

## 2018-12-25 NOTE — Progress Notes (Signed)
12/25/2018 Billy [name]   10-Aug-1950  [id number]  Primary Physician Billy Contras, MD Primary Cardiologist: Billy Harp MD Billy [name], [place]  HPI:  Billy [name] is a 68 y.o.  thin appearing divorced Caucasian male father to, grandfather of one grandchild who is retired from [employer] and was referred by Billy [name] be established in my practice for ongoing cardiovascular care. I last saw him in my office 06/15/2018.  He lives with his elderly mother and takes care of her. He has a history of treated hypertension hyperlipidemia. He is never smoked. He is never had a heart attack. There is no family history. He denies chest pain or shortness of breath. He has had an echo that shows normal LV function with inferior akinesia. He did have a TIA/stroke and had a TEE for embolic source which was negative and had a Linq device implanted by Billy [name] to rule out PAF as a etiology.  His Linq recorder did show episodes of PAF for which she was begun on Eliquis oral anticoagulation.  He was admitted in late December for GI bleed and hypotension.  He did undergo endoscopy by Billy [name] apparently finding a gastric ulcer.  His Eliquis was placed on hold.  He also had a non-STEMI with a peak troponin of 17 although he denies chest pain.  It was elected not to perform cardiac catheterization because of his GI bleed and moderate renal insufficiency with a known occluded left renal artery.  He was also evaluated by Billy [name] for his carotid disease which were evaluated by duplex ultrasound in August revealing moderate bilateral ICA stenosis thought not to be related to his stroke.  Since I saw him in my office he did have his right shoulder operated on 3 weeks ago and it seems to be in chronic pain.  His other major complaint is of swelling in his legs most likely related to amlodipine.  He denies chest pain or shortness of breath.  His hemoglobin recently checked last month was 13.5.  He  remains on Eliquis oral anticoagulation which she was on for PAF because of gastric bleeding.    Current Meds  Medication Sig  . amLODipine (NORVASC) 10 MG tablet Take 1 tablet (10 mg total) by mouth daily.  Marland Kitchen aspirin EC 81 MG tablet Take 1 tablet (81 mg total) by mouth daily.  Marland Kitchen atorvastatin (LIPITOR) 80 MG tablet TAKE 1 TABLET BY MOUTH DAILY AT 6 PM FOR 30 DAYS.  Marland Kitchen carvedilol (COREG) 25 MG tablet Take 25 mg by mouth 2 (two) times daily.   . ferrous sulfate 325 (65 FE) MG tablet Take 1 tablet (325 mg total) by mouth daily with breakfast.  . furosemide (LASIX) 40 MG tablet Take 40 mg by mouth daily.  Marland Kitchen gabapentin (NEURONTIN) 300 MG capsule Take 300 mg by mouth daily as needed (pain).   . hydrALAZINE (APRESOLINE) 100 MG tablet Take 100 mg by mouth 3 (three) times daily.  Marland Kitchen HYDROcodone-acetaminophen (NORCO) 10-325 MG tablet Take 1 tablet by mouth every 6 (six) hours as needed.  . isosorbide mononitrate (IMDUR) 30 MG 24 hr tablet Take 1 tablet (30 mg total) by mouth daily.  . methocarbamol (ROBAXIN) 500 MG tablet Take 1 tablet (500 mg total) by mouth every 8 (eight) hours as needed for muscle spasms.  . methocarbamol (ROBAXIN) 500 MG tablet Take 1 tablet (500 mg total) by mouth every 8 (eight) hours as needed for muscle spasms.  . Multiple Vitamin (  MULTIVITAMIN WITH MINERALS) TABS tablet Take 1 tablet by mouth daily.  . ondansetron (ZOFRAN) 4 MG tablet 1 po q 8 hrs prn nausea  . oxyCODONE (ROXICODONE) 5 MG immediate release tablet Take 1 tablet (5 mg total) by mouth every 6 (six) hours as needed.  . pantoprazole (PROTONIX) 40 MG tablet Take 1 tablet (40 mg total) by mouth 2 (two) times daily before a meal.  . pramipexole (MIRAPEX) 0.125 MG tablet Take 0.125 mg by mouth at bedtime.      Allergies  Allergen Reactions  . Morphine And Related Itching and Other (See Comments)    "Cannot handle it"    Social History   Socioeconomic History  . Marital status: Divorced    Spouse name: Not on  file  . Number of children: 2  . Years of education: 18  . Highest education level: Not on file  Occupational History  . 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  Social Needs  . Financial resource strain: Not on file  . Food insecurity    Worry: Not on file    Inability: Not on file  . Transportation needs    Medical: Not on file    Non-medical: Not on file  Tobacco Use  . Smoking status: Never Smoker  . Smokeless tobacco: Never Used  Substance and Sexual Activity  . Alcohol use: No  . Drug use: Never  . Sexual activity: Not Currently  Lifestyle  . Physical activity    Days per week: Not on file    Minutes per session: Not on file  . Stress: Not on file  Relationships  . Social Herbalist on phone: Not on file    Gets together: Not on file    Attends religious service: Not on file    Active member of club or organization: Not on file    Attends meetings of clubs or organizations: Not on file    Relationship status: Not on file  . Intimate partner violence    Fear of current or ex partner: Not on file    Emotionally abused: Not on file    Physically abused: Not on file    Forced sexual activity: Not on file  Other Topics Concern  . Not on file  Social History Narrative   Raised in [facility] [facility]   Caffeine use: coke sometimes   Lives with mother and cares for her currently.     Review of Systems: General: negative for chills, fever, night sweats or weight changes.  Cardiovascular: negative for chest pain, dyspnea on exertion, edema, orthopnea, palpitations, paroxysmal nocturnal dyspnea or shortness of breath Dermatological: negative for rash Respiratory: negative for cough or wheezing Urologic: negative for hematuria Abdominal: negative for nausea, vomiting, diarrhea, bright red blood per rectum, melena, or hematemesis Neurologic: negative for visual changes, syncope, or  dizziness All other systems reviewed and are otherwise negative except as noted above.    Blood pressure 140/71, pulse 60, height 6\' 2"  (1.88 m), weight 174 lb 3.2 oz (79 kg), SpO2 97 %.  General appearance: alert and no distress Neck: no adenopathy, no JVD, supple, symmetrical, trachea midline, thyroid not enlarged, symmetric, no tenderness/mass/nodules and Left carotid bruit Lungs: clear to auscultation bilaterally Heart: regular rate and rhythm, S1, S2 normal, no murmur, click, rub or gallop Extremities: extremities normal, atraumatic, no cyanosis or edema Pulses: 2+ and symmetric Skin: Skin color,  texture, turgor normal. No rashes or lesions Neurologic: Alert and oriented X 3, normal strength and tone. Normal symmetric reflexes. Normal coordination and gait  EKG not performed today  ASSESSMENT AND PLAN:   Hypertension History of essential hypertension with blood pressure measured today 140/71.  He is on amlodipine, carvedilol and hydralazine.  Does complain of some leg swelling after beginning amlodipine although with renal insufficiency he is not a candidate for an ACE/ARB.  Hyperlipidemia History of hyperlipidemia on statin therapy with lipid profile performed 04/27/2018 revealing total cholesterol 129, LDL 62 and HDL 55.  History of non-ST elevation myocardial infarction (NSTEMI) History of non-ST segment elevation myocardial infarction with a peak troponin of 17 in the past however a cardiac apposition was not pursued because of GI bleeding and moderate renal insufficiency.  Myoview stress test was ordered but never obtained.  He denies chest pain or shortness of breath.  PAF (paroxysmal atrial fibrillation) (HCC) History of prior stroke and PAF found on Linq monitoring.  He was on Eliquis oral anticoagulation which was stopped last December because of got gastric bleeding found on endoscopy with it ulcer.  It is elected to hold his Eliquis at that time.  He did have transfusion  of blood after he hemoglobin in April was 7.3.  Is currently 13.5.  Carotid artery disease ([place]) History of bilateral moderate ICA stenosis by duplex ultrasound performed 12/12/2017 which we will repeat.      Billy Harp MD FACP,FACC,FAHA, [facility] 12/25/2018 11:27 AM

## 2018-12-26 ENCOUNTER — Encounter: Payer: Self-pay | Admitting: Orthopedic Surgery

## 2018-12-26 ENCOUNTER — Ambulatory Visit (INDEPENDENT_AMBULATORY_CARE_PROVIDER_SITE_OTHER): Payer: PPO | Admitting: Orthopedic Surgery

## 2018-12-26 DIAGNOSIS — S46011D Strain of muscle(s) and tendon(s) of the rotator cuff of right shoulder, subsequent encounter: Secondary | ICD-10-CM
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
## 2018-12-27 ENCOUNTER — Telehealth: Payer: Self-pay | Admitting: Orthopedic Surgery

## 2018-12-27 NOTE — Telephone Encounter (Signed)
pls advise. Thanks.  

## 2018-12-27 NOTE — Telephone Encounter (Signed)
Okay to refill pain meds but please start tapering thanks

## 2018-12-27 NOTE — Telephone Encounter (Signed)
Patient called. Says he will run out of his pain meds the weekend. Would like you to send a rx for his pain meds. His call back number is [phone]. Thanks.

## 2018-12-28 ENCOUNTER — Other Ambulatory Visit: Payer: Self-pay | Admitting: Surgical

## 2018-12-28 MED ORDER — HYDROCODONE-ACETAMINOPHEN 5-325 MG PO TABS: 1.0000 | ORAL_TABLET | Freq: Four times a day (QID) | ORAL | 0 refills | Status: DC | PRN

## 2018-12-31 ENCOUNTER — Ambulatory Visit (HOSPITAL_COMMUNITY): Payer: PPO

## 2018-12-31 ENCOUNTER — Telehealth: Payer: Self-pay | Admitting: Cardiovascular Disease

## 2018-12-31 ENCOUNTER — Other Ambulatory Visit: Payer: Self-pay

## 2018-12-31 DIAGNOSIS — N390 Urinary tract infection, site not specified: Secondary | ICD-10-CM | POA: Diagnosis not present

## 2018-12-31 DIAGNOSIS — R399 Unspecified symptoms and signs involving the genitourinary system: Secondary | ICD-10-CM | POA: Diagnosis not present

## 2018-12-31 MED ORDER — ISOSORBIDE MONONITRATE ER 30 MG PO TB24: 30.0000 mg | ORAL_TABLET | Freq: Every day | ORAL | 1 refills | Status: DC

## 2018-12-31 NOTE — Telephone Encounter (Signed)
New Message ° ° ° °*STAT* If patient is at the pharmacy, call can be transferred to refill team. ° ° °1. Which medications need to be refilled? (please list name of each medication and dose if known) isosorbide mononitrate (IMDUR) 30 MG 24 hr tablet ° ° °2. Which pharmacy/location (including street and city if local pharmacy) is medication to be sent to? [id number] - [place], [place] - [address] ° °3. Do they need a 30 day or 90 day supply? 90 ° ° °

## 2019-01-07 ENCOUNTER — Other Ambulatory Visit (HOSPITAL_COMMUNITY): Payer: Self-pay | Admitting: Cardiovascular Disease

## 2019-01-07 ENCOUNTER — Other Ambulatory Visit: Payer: Self-pay

## 2019-01-07 ENCOUNTER — Ambulatory Visit (HOSPITAL_COMMUNITY)
Admission: RE | Admit: 2019-01-07 | Discharge: 2019-01-07 | Disposition: A | Discharge status: Home / Self Care | Payer: PPO | Source: Ambulatory Visit | Attending: Cardiology | Admitting: Cardiology

## 2019-01-07 DIAGNOSIS — G4700 Insomnia, unspecified: Secondary | ICD-10-CM | POA: Diagnosis not present

## 2019-01-07 DIAGNOSIS — I6523 Occlusion and stenosis of bilateral carotid arteries: Secondary | ICD-10-CM | POA: Diagnosis not present

## 2019-01-07 DIAGNOSIS — N189 Chronic kidney disease, unspecified: Secondary | ICD-10-CM | POA: Diagnosis not present

## 2019-01-07 DIAGNOSIS — R5383 Other fatigue: Secondary | ICD-10-CM | POA: Diagnosis not present

## 2019-01-08 ENCOUNTER — Other Ambulatory Visit: Payer: Self-pay | Admitting: *Deleted

## 2019-01-08 DIAGNOSIS — G4700 Insomnia, unspecified: Secondary | ICD-10-CM | POA: Diagnosis not present

## 2019-01-08 DIAGNOSIS — G2581 Restless legs syndrome: Secondary | ICD-10-CM | POA: Diagnosis not present

## 2019-01-08 DIAGNOSIS — N183 Chronic kidney disease, stage 3 (moderate): Secondary | ICD-10-CM | POA: Diagnosis not present

## 2019-01-08 DIAGNOSIS — I6523 Occlusion and stenosis of bilateral carotid arteries: Secondary | ICD-10-CM

## 2019-01-14 ENCOUNTER — Ambulatory Visit (INDEPENDENT_AMBULATORY_CARE_PROVIDER_SITE_OTHER): Payer: PPO | Admitting: *Deleted

## 2019-01-14 DIAGNOSIS — I480 Paroxysmal atrial fibrillation: Secondary | ICD-10-CM

## 2019-01-15 LAB — CUP PACEART REMOTE DEVICE CHECK
Date Time Interrogation Session: 20200929184516
Implantable Pulse Generator Implant Date: 20190829

## 2019-01-16 ENCOUNTER — Encounter: Payer: Self-pay | Admitting: Orthopedic Surgery

## 2019-01-16 ENCOUNTER — Ambulatory Visit (INDEPENDENT_AMBULATORY_CARE_PROVIDER_SITE_OTHER): Payer: PPO | Admitting: Orthopedic Surgery

## 2019-01-16 DIAGNOSIS — S46011D Strain of muscle(s) and tendon(s) of the rotator cuff of right shoulder, subsequent encounter: Secondary | ICD-10-CM

## 2019-01-18 ENCOUNTER — Encounter: Payer: Self-pay | Admitting: Orthopedic Surgery
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
## 2019-01-23 NOTE — Progress Notes (Signed)
Carelink Summary Report / Loop Recorder 

## 2019-01-28 DIAGNOSIS — M75121 Complete rotator cuff tear or rupture of right shoulder, not specified as traumatic: Secondary | ICD-10-CM | POA: Diagnosis not present

## 2019-01-28 DIAGNOSIS — M25511 Pain in right shoulder: Secondary | ICD-10-CM | POA: Diagnosis not present

## 2019-01-30 DIAGNOSIS — M75121 Complete rotator cuff tear or rupture of right shoulder, not specified as traumatic: Secondary | ICD-10-CM | POA: Diagnosis not present

## 2019-01-30 DIAGNOSIS — M25511 Pain in right shoulder: Secondary | ICD-10-CM | POA: Diagnosis not present

## 2019-02-06 DIAGNOSIS — M75121 Complete rotator cuff tear or rupture of right shoulder, not specified as traumatic: Secondary | ICD-10-CM | POA: Diagnosis not present

## 2019-02-06 DIAGNOSIS — M25511 Pain in right shoulder: Secondary | ICD-10-CM | POA: Diagnosis not present

## 2019-02-08 DIAGNOSIS — M25511 Pain in right shoulder: Secondary | ICD-10-CM | POA: Diagnosis not present

## 2019-02-08 DIAGNOSIS — M75121 Complete rotator cuff tear or rupture of right shoulder, not specified as traumatic: Secondary | ICD-10-CM | POA: Diagnosis not present

## 2019-02-11 DIAGNOSIS — M75121 Complete rotator cuff tear or rupture of right shoulder, not specified as traumatic: Secondary | ICD-10-CM | POA: Diagnosis not present

## 2019-02-11 DIAGNOSIS — M25511 Pain in right shoulder: Secondary | ICD-10-CM | POA: Diagnosis not present

## 2019-02-13 DIAGNOSIS — M25511 Pain in right shoulder: Secondary | ICD-10-CM | POA: Diagnosis not present

## 2019-02-13 DIAGNOSIS — M75121 Complete rotator cuff tear or rupture of right shoulder, not specified as traumatic: Secondary | ICD-10-CM | POA: Diagnosis not present

## 2019-02-17 LAB — CUP PACEART REMOTE DEVICE CHECK
Date Time Interrogation Session: 20201101182638
Implantable Pulse Generator Implant Date: 20190829

## 2019-02-18 ENCOUNTER — Ambulatory Visit (INDEPENDENT_AMBULATORY_CARE_PROVIDER_SITE_OTHER): Payer: PPO | Admitting: *Deleted

## 2019-02-18 DIAGNOSIS — I480 Paroxysmal atrial fibrillation: Secondary | ICD-10-CM

## 2019-02-18 DIAGNOSIS — G459 Transient cerebral ischemic attack, unspecified: Secondary | ICD-10-CM

## 2019-02-19 DIAGNOSIS — M75121 Complete rotator cuff tear or rupture of right shoulder, not specified as traumatic: Secondary | ICD-10-CM | POA: Diagnosis not present

## 2019-02-19 DIAGNOSIS — M25511 Pain in right shoulder: Secondary | ICD-10-CM | POA: Diagnosis not present

## 2019-02-22 DIAGNOSIS — M25511 Pain in right shoulder: Secondary | ICD-10-CM | POA: Diagnosis not present

## 2019-02-22 DIAGNOSIS — M75121 Complete rotator cuff tear or rupture of right shoulder, not specified as traumatic: Secondary | ICD-10-CM | POA: Diagnosis not present

## 2019-02-26 DIAGNOSIS — M75121 Complete rotator cuff tear or rupture of right shoulder, not specified as traumatic: Secondary | ICD-10-CM | POA: Diagnosis not present

## 2019-02-26 DIAGNOSIS — M25511 Pain in right shoulder: Secondary | ICD-10-CM | POA: Diagnosis not present

## 2019-02-27 ENCOUNTER — Ambulatory Visit (INDEPENDENT_AMBULATORY_CARE_PROVIDER_SITE_OTHER): Payer: PPO | Admitting: Orthopedic Surgery

## 2019-02-27 ENCOUNTER — Other Ambulatory Visit: Payer: Self-pay

## 2019-02-27 ENCOUNTER — Encounter: Payer: Self-pay | Admitting: Orthopedic Surgery

## 2019-02-27 DIAGNOSIS — S46011D Strain of muscle(s) and tendon(s) of the rotator cuff of right shoulder, subsequent encounter: Secondary | ICD-10-CM

## 2019-02-28 ENCOUNTER — Encounter: Payer: Self-pay | Admitting: Orthopedic Surgery
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
## 2019-03-01 DIAGNOSIS — K922 Gastrointestinal hemorrhage, unspecified: Secondary | ICD-10-CM | POA: Diagnosis not present

## 2019-03-01 DIAGNOSIS — I5022 Chronic systolic (congestive) heart failure: Secondary | ICD-10-CM | POA: Diagnosis not present

## 2019-03-01 DIAGNOSIS — N2581 Secondary hyperparathyroidism of renal origin: Secondary | ICD-10-CM | POA: Diagnosis not present

## 2019-03-01 DIAGNOSIS — D631 Anemia in chronic kidney disease: Secondary | ICD-10-CM | POA: Diagnosis not present

## 2019-03-01 DIAGNOSIS — I129 Hypertensive chronic kidney disease with stage 1 through stage 4 chronic kidney disease, or unspecified chronic kidney disease: Secondary | ICD-10-CM | POA: Diagnosis not present

## 2019-03-01 DIAGNOSIS — N189 Chronic kidney disease, unspecified: Secondary | ICD-10-CM | POA: Diagnosis not present

## 2019-03-01 DIAGNOSIS — N1832 Chronic kidney disease, stage 3b: Secondary | ICD-10-CM | POA: Diagnosis not present

## 2019-03-01 DIAGNOSIS — I701 Atherosclerosis of renal artery: Secondary | ICD-10-CM | POA: Diagnosis not present

## 2019-03-01 DIAGNOSIS — I4891 Unspecified atrial fibrillation: Secondary | ICD-10-CM | POA: Diagnosis not present

## 2019-03-04 DIAGNOSIS — M25511 Pain in right shoulder: Secondary | ICD-10-CM | POA: Diagnosis not present

## 2019-03-04 DIAGNOSIS — M75121 Complete rotator cuff tear or rupture of right shoulder, not specified as traumatic: Secondary | ICD-10-CM | POA: Diagnosis not present

## 2019-03-06 DIAGNOSIS — M25511 Pain in right shoulder: Secondary | ICD-10-CM | POA: Diagnosis not present

## 2019-03-06 DIAGNOSIS — M75121 Complete rotator cuff tear or rupture of right shoulder, not specified as traumatic: Secondary | ICD-10-CM | POA: Diagnosis not present

## 2019-03-07 NOTE — Progress Notes (Signed)
Thank you very much 

## 2019-03-10 IMAGING — US US RENAL
1 series · 14 of 25 positions shown · non-contrast
Comparison: Renal ultrasound performed 07/27/2017

CLINICAL DATA: Acute onset of renal insufficiency.

EXAM:
RENAL / URINARY TRACT ULTRASOUND COMPLETE

[Series 1: us renal · 14 of 29 slices shown]
[im 1/29]
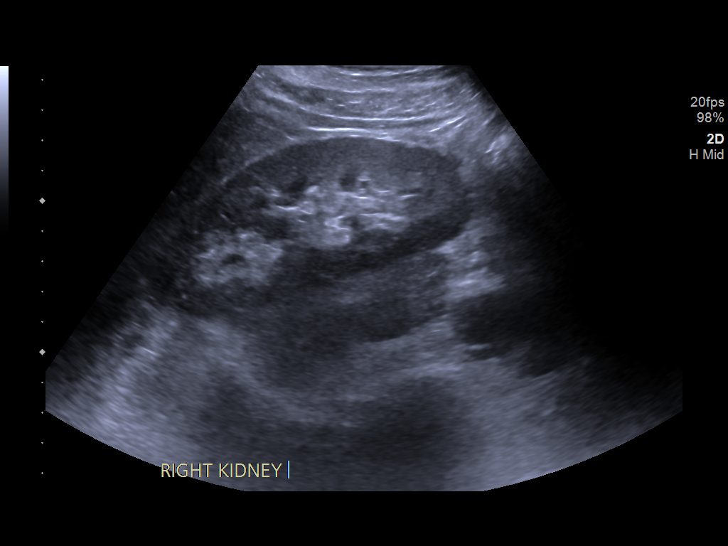
[im 3/29]
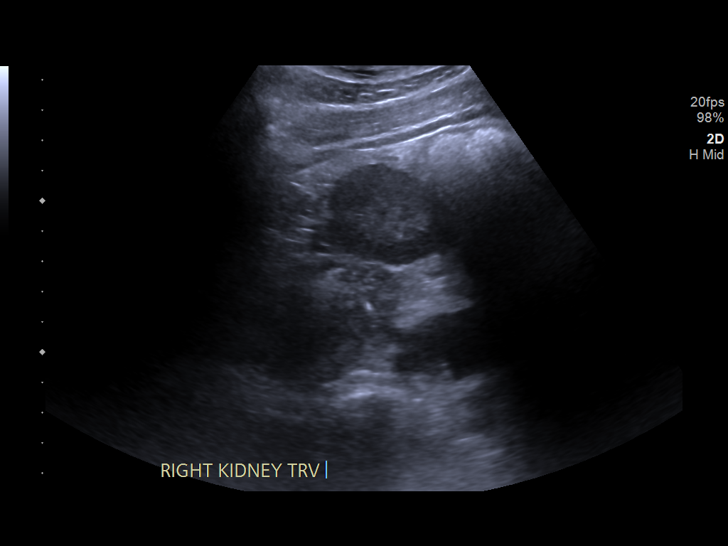
[im 5/29]
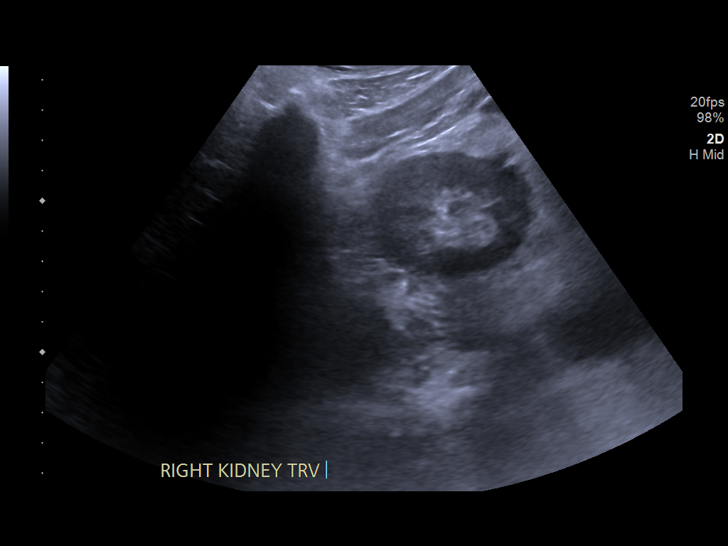
[im 8/29]
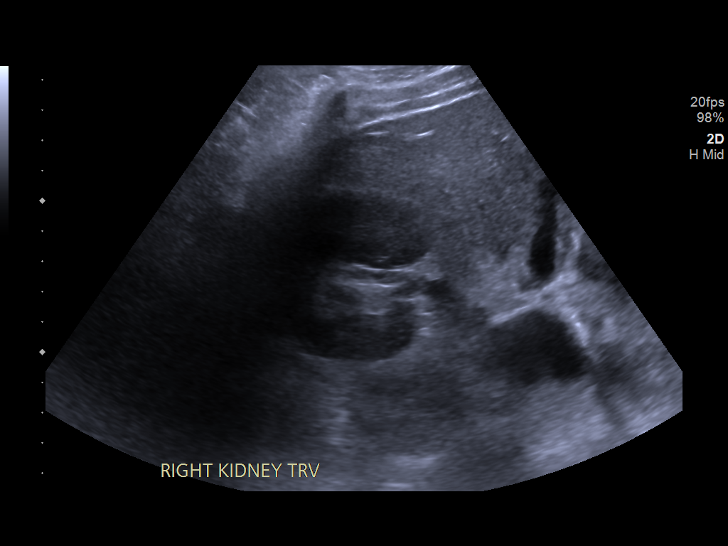
[im 10/29]
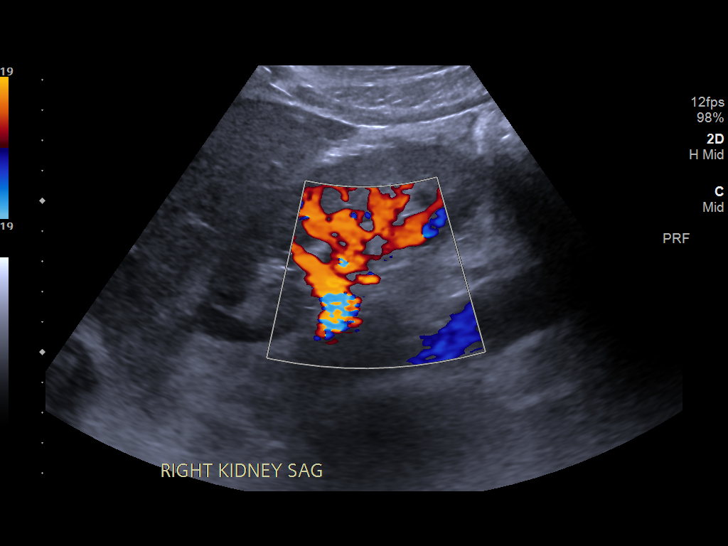
[im 11/29]
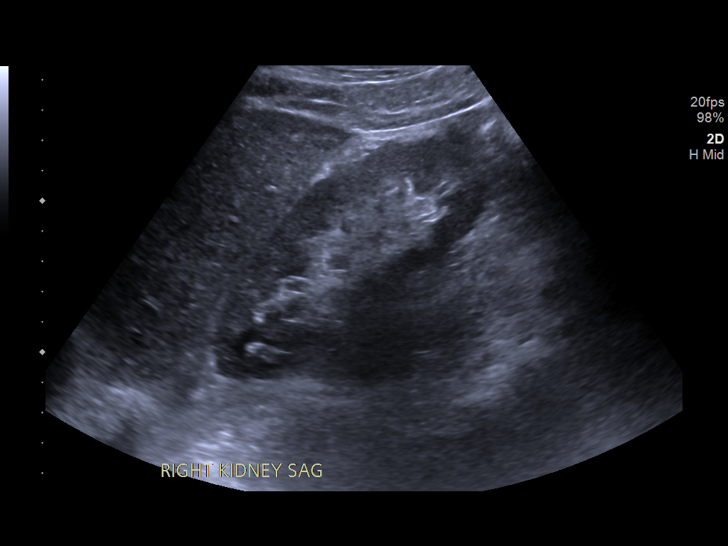
[im 13/29]
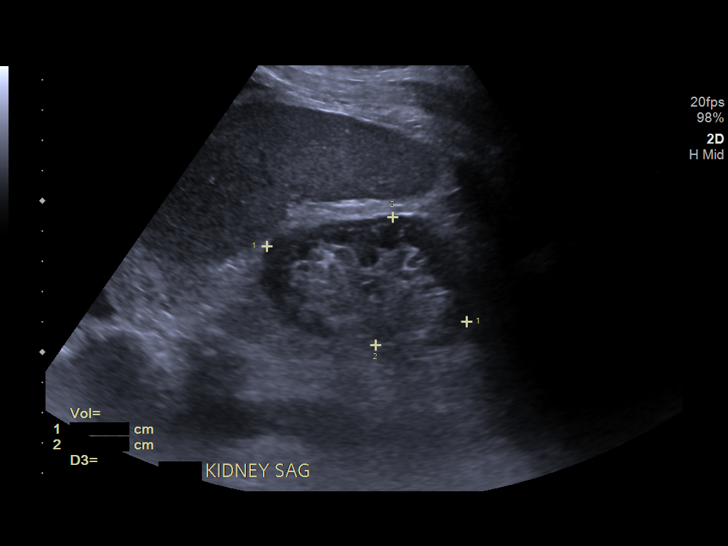
[im 16/29]
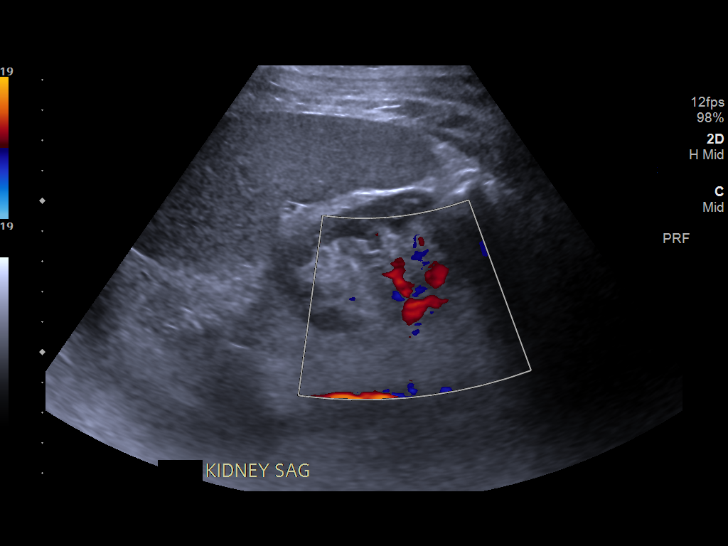
[im 18/29]
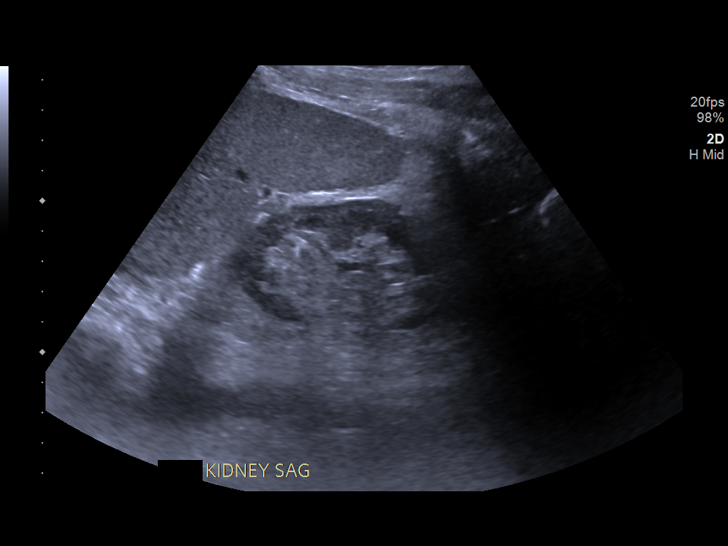
[im 19/29]
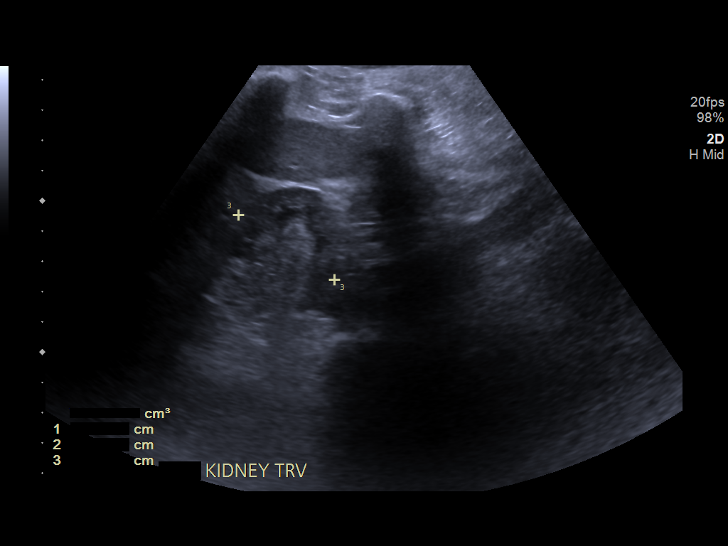
[im 22/29]
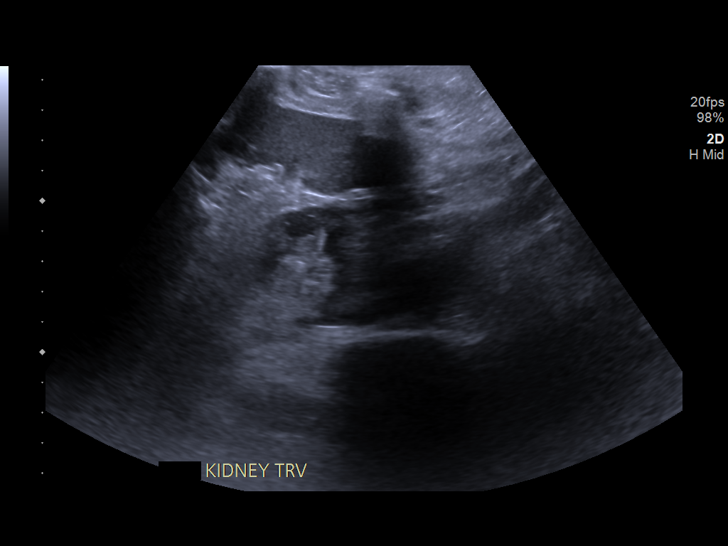
[im 24/29]
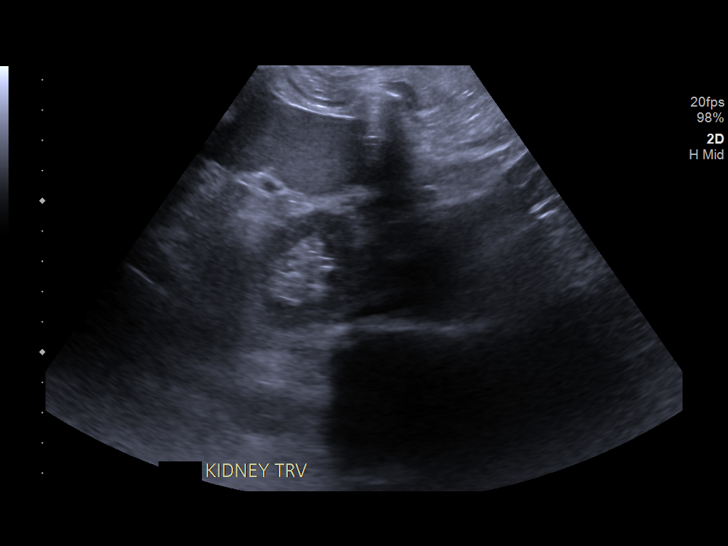
[im 26/29]
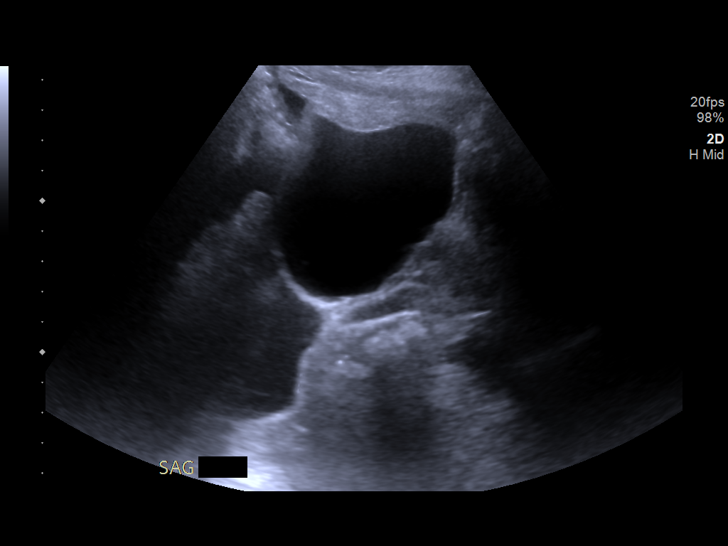
[im 29/29]
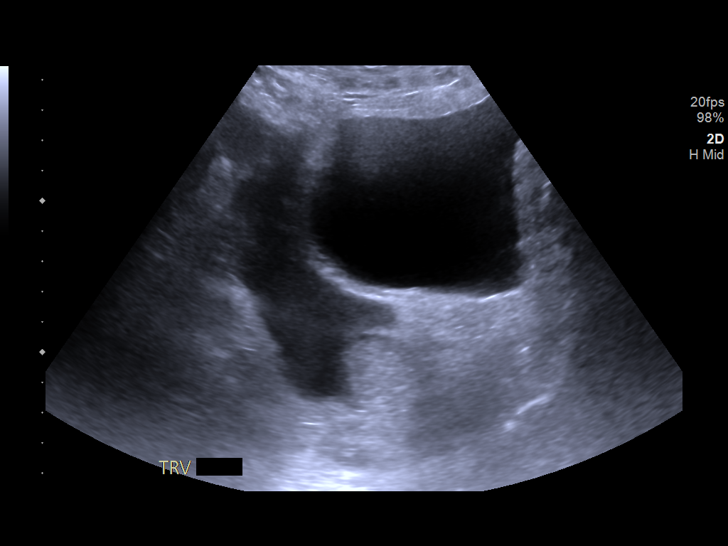

[14 of 25 positions shown; findings below may reference images not displayed]

FINDINGS: Right Kidney:

Renal measurements: 11.3 x 4.8 x 5.1 cm = volume: 148.7 mL .
Echogenicity within normal limits. No mass or hydronephrosis
visualized.

Left Kidney:

Renal measurements: 7.1 x 4.2 x 3.8 cm = volume: 60.5 mL.
Echogenicity within normal limits. No mass or hydronephrosis
visualized.

Bladder:

Appears normal for degree of bladder distention.

Small bilateral pleural effusions are noted. Mild ascites is noted
within the abdomen and pelvis.
IMPRESSION: 1. No evidence of hydronephrosis.
2. Left renal atrophy noted.
3. Small bilateral pleural effusions.
4. Mild ascites within the abdomen and pelvis.

## 2019-03-13 DIAGNOSIS — M75121 Complete rotator cuff tear or rupture of right shoulder, not specified as traumatic: Secondary | ICD-10-CM | POA: Diagnosis not present

## 2019-03-13 DIAGNOSIS — M25511 Pain in right shoulder: Secondary | ICD-10-CM | POA: Diagnosis not present

## 2019-03-13 NOTE — Progress Notes (Signed)
Carelink Summary Report / Loop Recorder 

## 2019-03-21 DIAGNOSIS — M25511 Pain in right shoulder: Secondary | ICD-10-CM | POA: Diagnosis not present

## 2019-03-21 DIAGNOSIS — M75121 Complete rotator cuff tear or rupture of right shoulder, not specified as traumatic: Secondary | ICD-10-CM | POA: Diagnosis not present

## 2019-03-22 ENCOUNTER — Ambulatory Visit (INDEPENDENT_AMBULATORY_CARE_PROVIDER_SITE_OTHER): Payer: PPO | Admitting: *Deleted

## 2019-03-22 DIAGNOSIS — Z8673 Personal history of transient ischemic attack (TIA), and cerebral infarction without residual deficits: Secondary | ICD-10-CM

## 2019-03-23 LAB — CUP PACEART REMOTE DEVICE CHECK
Date Time Interrogation Session: 20201204134658
Implantable Pulse Generator Implant Date: 20190829

## 2019-03-26 DIAGNOSIS — M25511 Pain in right shoulder: Secondary | ICD-10-CM | POA: Diagnosis not present

## 2019-03-26 DIAGNOSIS — M75121 Complete rotator cuff tear or rupture of right shoulder, not specified as traumatic: Secondary | ICD-10-CM | POA: Diagnosis not present

## 2019-03-28 DIAGNOSIS — M75121 Complete rotator cuff tear or rupture of right shoulder, not specified as traumatic: Secondary | ICD-10-CM | POA: Diagnosis not present

## 2019-03-28 DIAGNOSIS — M25511 Pain in right shoulder: Secondary | ICD-10-CM | POA: Diagnosis not present

## 2019-04-05 ENCOUNTER — Other Ambulatory Visit: Payer: Self-pay | Admitting: Cardiovascular Disease

## 2019-04-08 DIAGNOSIS — M75121 Complete rotator cuff tear or rupture of right shoulder, not specified as traumatic: Secondary | ICD-10-CM | POA: Diagnosis not present

## 2019-04-08 DIAGNOSIS — M25511 Pain in right shoulder: Secondary | ICD-10-CM | POA: Diagnosis not present

## 2019-04-22 DIAGNOSIS — M75121 Complete rotator cuff tear or rupture of right shoulder, not specified as traumatic: Secondary | ICD-10-CM | POA: Diagnosis not present

## 2019-04-22 DIAGNOSIS — M25511 Pain in right shoulder: Secondary | ICD-10-CM | POA: Diagnosis not present

## 2019-04-24 ENCOUNTER — Ambulatory Visit (INDEPENDENT_AMBULATORY_CARE_PROVIDER_SITE_OTHER): Payer: PPO | Admitting: *Deleted

## 2019-04-24 DIAGNOSIS — Z8673 Personal history of transient ischemic attack (TIA), and cerebral infarction without residual deficits: Secondary | ICD-10-CM | POA: Diagnosis not present

## 2019-04-25 LAB — CUP PACEART REMOTE DEVICE CHECK
Date Time Interrogation Session: 20210106135946
Implantable Pulse Generator Implant Date: 20190829

## 2019-05-06 DIAGNOSIS — R05 Cough: Secondary | ICD-10-CM | POA: Diagnosis not present

## 2019-05-06 DIAGNOSIS — U071 COVID-19: Secondary | ICD-10-CM | POA: Diagnosis not present

## 2019-05-16 DIAGNOSIS — I639 Cerebral infarction, unspecified: Secondary | ICD-10-CM | POA: Diagnosis not present

## 2019-05-16 DIAGNOSIS — E7800 Pure hypercholesterolemia, unspecified: Secondary | ICD-10-CM | POA: Diagnosis not present

## 2019-05-16 DIAGNOSIS — I10 Essential (primary) hypertension: Secondary | ICD-10-CM | POA: Diagnosis not present

## 2019-05-16 DIAGNOSIS — I63412 Cerebral infarction due to embolism of left middle cerebral artery: Secondary | ICD-10-CM | POA: Diagnosis not present

## 2019-05-16 DIAGNOSIS — I480 Paroxysmal atrial fibrillation: Secondary | ICD-10-CM | POA: Diagnosis not present

## 2019-05-16 DIAGNOSIS — I252 Old myocardial infarction: Secondary | ICD-10-CM | POA: Diagnosis not present

## 2019-05-27 ENCOUNTER — Ambulatory Visit (INDEPENDENT_AMBULATORY_CARE_PROVIDER_SITE_OTHER): Payer: PPO | Admitting: *Deleted

## 2019-05-27 DIAGNOSIS — Z8673 Personal history of transient ischemic attack (TIA), and cerebral infarction without residual deficits: Secondary | ICD-10-CM

## 2019-05-27 LAB — CUP PACEART REMOTE DEVICE CHECK
Date Time Interrogation Session: 20210207235035
Implantable Pulse Generator Implant Date: 20190829

## 2019-05-28 NOTE — Progress Notes (Signed)
ILR Remote 

## 2019-06-04 ENCOUNTER — Telehealth: Payer: Self-pay | Admitting: *Deleted

## 2019-06-04 NOTE — Telephone Encounter (Signed)
A message was left, re: his follow up visit. 

## 2019-06-18 DIAGNOSIS — I10 Essential (primary) hypertension: Secondary | ICD-10-CM | POA: Diagnosis not present

## 2019-06-18 DIAGNOSIS — G2581 Restless legs syndrome: Secondary | ICD-10-CM | POA: Diagnosis not present

## 2019-06-18 DIAGNOSIS — Z8673 Personal history of transient ischemic attack (TIA), and cerebral infarction without residual deficits: Secondary | ICD-10-CM | POA: Diagnosis not present

## 2019-06-18 DIAGNOSIS — E7800 Pure hypercholesterolemia, unspecified: Secondary | ICD-10-CM | POA: Diagnosis not present

## 2019-06-18 DIAGNOSIS — I252 Old myocardial infarction: Secondary | ICD-10-CM | POA: Diagnosis not present

## 2019-06-18 DIAGNOSIS — I480 Paroxysmal atrial fibrillation: Secondary | ICD-10-CM | POA: Diagnosis not present

## 2019-06-18 DIAGNOSIS — Z8616 Personal history of COVID-19: Secondary | ICD-10-CM | POA: Diagnosis not present

## 2019-06-18 DIAGNOSIS — R609 Edema, unspecified: Secondary | ICD-10-CM | POA: Diagnosis not present

## 2019-06-18 DIAGNOSIS — G629 Polyneuropathy, unspecified: Secondary | ICD-10-CM | POA: Diagnosis not present
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
## 2019-06-26 ENCOUNTER — Telehealth (INDEPENDENT_AMBULATORY_CARE_PROVIDER_SITE_OTHER): Payer: PPO | Admitting: Adult Health

## 2019-06-26 ENCOUNTER — Encounter: Payer: Self-pay | Admitting: Adult Health

## 2019-06-26 VITALS — BP 153/83 | HR 67 | Ht 74.0 in | Wt 180.0 lb

## 2019-06-26 DIAGNOSIS — Z9119 Patient's noncompliance with other medical treatment and regimen: Secondary | ICD-10-CM

## 2019-06-26 DIAGNOSIS — I10 Essential (primary) hypertension: Secondary | ICD-10-CM

## 2019-06-26 DIAGNOSIS — I701 Atherosclerosis of renal artery: Secondary | ICD-10-CM | POA: Diagnosis not present

## 2019-06-26 DIAGNOSIS — I480 Paroxysmal atrial fibrillation: Secondary | ICD-10-CM | POA: Diagnosis not present

## 2019-06-26 DIAGNOSIS — I6523 Occlusion and stenosis of bilateral carotid arteries: Secondary | ICD-10-CM

## 2019-06-26 DIAGNOSIS — Z91199 Patient's noncompliance with other medical treatment and regimen due to unspecified reason: Secondary | ICD-10-CM

## 2019-06-26 NOTE — Patient Instructions (Signed)

## 2019-06-27 ENCOUNTER — Ambulatory Visit (INDEPENDENT_AMBULATORY_CARE_PROVIDER_SITE_OTHER): Payer: PPO | Admitting: *Deleted

## 2019-06-27 DIAGNOSIS — Z8673 Personal history of transient ischemic attack (TIA), and cerebral infarction without residual deficits: Secondary | ICD-10-CM

## 2019-06-27 LAB — CUP PACEART REMOTE DEVICE CHECK
Date Time Interrogation Session: 20210311002223
Implantable Pulse Generator Implant Date: 20190829

## 2019-06-27 NOTE — Progress Notes (Signed)
ILR Remote 

## 2019-07-16 NOTE — Telephone Encounter (Signed)
disregard

## 2019-07-18 ENCOUNTER — Telehealth: Payer: Self-pay

## 2019-07-18 DIAGNOSIS — N189 Chronic kidney disease, unspecified: Secondary | ICD-10-CM | POA: Diagnosis not present

## 2019-07-18 DIAGNOSIS — I4891 Unspecified atrial fibrillation: Secondary | ICD-10-CM | POA: Diagnosis not present

## 2019-07-18 DIAGNOSIS — N2581 Secondary hyperparathyroidism of renal origin: Secondary | ICD-10-CM | POA: Diagnosis not present

## 2019-07-18 DIAGNOSIS — I129 Hypertensive chronic kidney disease with stage 1 through stage 4 chronic kidney disease, or unspecified chronic kidney disease: Secondary | ICD-10-CM | POA: Diagnosis not present

## 2019-07-18 DIAGNOSIS — D631 Anemia in chronic kidney disease: Secondary | ICD-10-CM | POA: Diagnosis not present

## 2019-07-18 DIAGNOSIS — N1832 Chronic kidney disease, stage 3b: Secondary | ICD-10-CM | POA: Diagnosis not present

## 2019-07-18 NOTE — Telephone Encounter (Signed)
Pt called about receiving a letter from our clinic about sending a transmission and his monitor may have been disconnected. I assured pt that we have gotten a transmission and as long as he keeps his monitor plugged up it will transmit on its own. Pt verbalized understanding

## 2019-08-12 DIAGNOSIS — E7800 Pure hypercholesterolemia, unspecified: Secondary | ICD-10-CM | POA: Diagnosis not present

## 2019-08-12 DIAGNOSIS — I63412 Cerebral infarction due to embolism of left middle cerebral artery: Secondary | ICD-10-CM | POA: Diagnosis not present

## 2019-08-12 DIAGNOSIS — N183 Chronic kidney disease, stage 3 unspecified: Secondary | ICD-10-CM | POA: Diagnosis not present

## 2019-08-12 DIAGNOSIS — I480 Paroxysmal atrial fibrillation: Secondary | ICD-10-CM | POA: Diagnosis not present

## 2019-08-12 DIAGNOSIS — I252 Old myocardial infarction: Secondary | ICD-10-CM | POA: Diagnosis not present

## 2019-08-12 DIAGNOSIS — I639 Cerebral infarction, unspecified: Secondary | ICD-10-CM | POA: Diagnosis not present

## 2019-08-12 DIAGNOSIS — I10 Essential (primary) hypertension: Secondary | ICD-10-CM | POA: Diagnosis not present

## 2019-08-26 DIAGNOSIS — Z7189 Other specified counseling: Secondary | ICD-10-CM | POA: Diagnosis not present

## 2019-08-26 DIAGNOSIS — M62830 Muscle spasm of back: Secondary | ICD-10-CM | POA: Diagnosis not present

## 2019-08-31 ENCOUNTER — Ambulatory Visit (HOSPITAL_COMMUNITY)
Admission: EM | Admit: 2019-08-31 | Discharge: 2019-08-31 | Disposition: A | Discharge status: Home / Self Care | Payer: PPO | Attending: Family Medicine | Admitting: Family Medicine

## 2019-08-31 ENCOUNTER — Encounter (HOSPITAL_COMMUNITY): Payer: Self-pay | Admitting: *Deleted

## 2019-08-31 ENCOUNTER — Other Ambulatory Visit: Payer: Self-pay

## 2019-08-31 DIAGNOSIS — M545 Low back pain, unspecified: Secondary | ICD-10-CM

## 2019-08-31 DIAGNOSIS — M7989 Other specified soft tissue disorders: Secondary | ICD-10-CM

## 2019-08-31 DIAGNOSIS — R5383 Other fatigue: Secondary | ICD-10-CM

## 2019-08-31 DIAGNOSIS — R42 Dizziness and giddiness: Secondary | ICD-10-CM | POA: Diagnosis not present

## 2019-08-31 HISTORY — DX: Unspecified atrial fibrillation: I48.91

## 2019-08-31 LAB — CBC WITH DIFFERENTIAL/PLATELET
Abs Immature Granulocytes: 0.04 10*3/uL (ref 0.00–0.07)
Basophils Absolute: 0.1 10*3/uL (ref 0.0–0.1)
Basophils Relative: 1 %
Eosinophils Absolute: 0.2 10*3/uL (ref 0.0–0.5)
Eosinophils Relative: 2 %
HCT: 46.2 % (ref 39.0–52.0)
Hemoglobin: 14.8 g/dL (ref 13.0–17.0)
Immature Granulocytes: 1 %
Lymphocytes Relative: 13 %
Lymphs Abs: 1.1 10*3/uL (ref 0.7–4.0)
MCH: 28.8 pg (ref 26.0–34.0)
MCHC: 32 g/dL (ref 30.0–36.0)
MCV: 89.9 fL (ref 80.0–100.0)
Monocytes Absolute: 0.6 10*3/uL (ref 0.1–1.0)
Monocytes Relative: 6 %
Neutro Abs: 6.7 10*3/uL (ref 1.7–7.7)
Neutrophils Relative %: 77 %
Platelets: 191 10*3/uL (ref 150–400)
RBC: 5.14 MIL/uL (ref 4.22–5.81)
RDW: 14.7 % (ref 11.5–15.5)
WBC: 8.6 10*3/uL (ref 4.0–10.5)
nRBC: 0 % (ref 0.0–0.2)

## 2019-08-31 LAB — COMPREHENSIVE METABOLIC PANEL
ALT: 36 U/L (ref 0–44)
AST: 47 U/L — ABNORMAL HIGH (ref 15–41)
Albumin: 3.8 g/dL (ref 3.5–5.0)
Alkaline Phosphatase: 46 U/L (ref 38–126)
Anion gap: 10 (ref 5–15)
BUN: 42 mg/dL — ABNORMAL HIGH (ref 8–23)
CO2: 23 mmol/L (ref 22–32)
Calcium: 8.8 mg/dL — ABNORMAL LOW (ref 8.9–10.3)
Chloride: 109 mmol/L (ref 98–111)
Creatinine, Ser: 2.55 mg/dL — ABNORMAL HIGH (ref 0.61–1.24)
GFR calc Af Amer: 29 mL/min — ABNORMAL LOW (ref >60)
GFR calc non Af Amer: 25 mL/min — ABNORMAL LOW (ref >60)
Glucose, Bld: 85 mg/dL (ref 70–99)
Potassium: 4.1 mmol/L (ref 3.5–5.1)
Sodium: 142 mmol/L (ref 135–145)
Total Bilirubin: 1.1 mg/dL (ref 0.3–1.2)
Total Protein: 6.9 g/dL (ref 6.5–8.1)

## 2019-08-31 LAB — POCT URINALYSIS DIP (DEVICE)
Bilirubin Urine: NEGATIVE
Glucose, UA: NEGATIVE mg/dL
Hgb urine dipstick: NEGATIVE
Ketones, ur: NEGATIVE mg/dL
Leukocytes,Ua: NEGATIVE
Nitrite: NEGATIVE
Protein, ur: 100 mg/dL — AB
Specific Gravity, Urine: 1.025 (ref 1.005–1.030)
Urobilinogen, UA: 0.2 mg/dL (ref 0.0–1.0)
pH: 5.5 (ref 5.0–8.0)

## 2019-08-31 NOTE — ED Triage Notes (Addendum)
C/O left low back pain radiating into left buttock after doing yardwork 2 days ago.  Denies parasthesias. Also c/o "just not feeling right; swimmy-headed, more tired than I normally am" over past couple days.  Denies any weakness, body aches, congestion, sinus sxs.  C/O slight dizziness.  Gait normal.  Reports having + Covid approx 2 months ago.

## 2019-08-31 NOTE — Discharge Instructions (Addendum)
UA in office today.  CBC and CMP are pending, if there is anything abnormal we will let you know.  Follow up with PCP and nephrology as needed.  Take tylenol for your back pain, may use a topical rub and heat as needed.
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
## 2019-09-03 DIAGNOSIS — I480 Paroxysmal atrial fibrillation: Secondary | ICD-10-CM | POA: Diagnosis not present

## 2019-09-03 DIAGNOSIS — I10 Essential (primary) hypertension: Secondary | ICD-10-CM | POA: Diagnosis not present

## 2019-09-03 DIAGNOSIS — G2581 Restless legs syndrome: Secondary | ICD-10-CM | POA: Diagnosis not present

## 2019-09-03 DIAGNOSIS — I252 Old myocardial infarction: Secondary | ICD-10-CM | POA: Diagnosis not present

## 2019-09-03 DIAGNOSIS — Z8739 Personal history of other diseases of the musculoskeletal system and connective tissue: Secondary | ICD-10-CM | POA: Diagnosis not present

## 2019-09-03 DIAGNOSIS — Z8673 Personal history of transient ischemic attack (TIA), and cerebral infarction without residual deficits: Secondary | ICD-10-CM | POA: Diagnosis not present

## 2019-09-03 DIAGNOSIS — G629 Polyneuropathy, unspecified: Secondary | ICD-10-CM | POA: Diagnosis not present

## 2019-09-03 DIAGNOSIS — R609 Edema, unspecified: Secondary | ICD-10-CM | POA: Diagnosis not present

## 2019-09-03 DIAGNOSIS — E7800 Pure hypercholesterolemia, unspecified: Secondary | ICD-10-CM | POA: Diagnosis not present

## 2019-09-03 DIAGNOSIS — Z8616 Personal history of COVID-19: Secondary | ICD-10-CM | POA: Diagnosis not present

## 2019-09-12 DIAGNOSIS — L578 Other skin changes due to chronic exposure to nonionizing radiation: Secondary | ICD-10-CM | POA: Diagnosis not present

## 2019-09-12 DIAGNOSIS — L719 Rosacea, unspecified: Secondary | ICD-10-CM | POA: Diagnosis not present

## 2019-09-12 DIAGNOSIS — L821 Other seborrheic keratosis: Secondary | ICD-10-CM | POA: Diagnosis not present

## 2019-09-12 DIAGNOSIS — D485 Neoplasm of uncertain behavior of skin: Secondary | ICD-10-CM | POA: Diagnosis not present

## 2019-09-12 DIAGNOSIS — Z85828 Personal history of other malignant neoplasm of skin: Secondary | ICD-10-CM | POA: Diagnosis not present

## 2019-09-12 DIAGNOSIS — L308 Other specified dermatitis: Secondary | ICD-10-CM | POA: Diagnosis not present

## 2019-09-12 DIAGNOSIS — D1801 Hemangioma of skin and subcutaneous tissue: Secondary | ICD-10-CM | POA: Diagnosis not present

## 2019-09-12 DIAGNOSIS — L723 Sebaceous cyst: Secondary | ICD-10-CM | POA: Diagnosis not present

## 2019-09-12 DIAGNOSIS — D1724 Benign lipomatous neoplasm of skin and subcutaneous tissue of left leg: Secondary | ICD-10-CM | POA: Diagnosis not present

## 2019-09-12 DIAGNOSIS — B078 Other viral warts: Secondary | ICD-10-CM | POA: Diagnosis not present

## 2019-09-12 DIAGNOSIS — L814 Other melanin hyperpigmentation: Secondary | ICD-10-CM | POA: Diagnosis not present

## 2019-09-12 DIAGNOSIS — L570 Actinic keratosis: Secondary | ICD-10-CM | POA: Diagnosis not present

## 2019-09-30 ENCOUNTER — Other Ambulatory Visit: Payer: Self-pay

## 2019-09-30 DIAGNOSIS — H524 Presbyopia: Secondary | ICD-10-CM | POA: Diagnosis not present

## 2019-09-30 DIAGNOSIS — H2511 Age-related nuclear cataract, right eye: Secondary | ICD-10-CM | POA: Diagnosis not present

## 2019-09-30 DIAGNOSIS — H35033 Hypertensive retinopathy, bilateral: Secondary | ICD-10-CM | POA: Diagnosis not present

## 2019-09-30 DIAGNOSIS — H35363 Drusen (degenerative) of macula, bilateral: Secondary | ICD-10-CM | POA: Diagnosis not present

## 2019-09-30 DIAGNOSIS — H25011 Cortical age-related cataract, right eye: Secondary | ICD-10-CM | POA: Diagnosis not present

## 2019-10-07 ENCOUNTER — Other Ambulatory Visit: Payer: Self-pay

## 2019-10-07 ENCOUNTER — Other Ambulatory Visit: Payer: Self-pay | Admitting: Cardiovascular Disease

## 2019-10-07 ENCOUNTER — Emergency Department (HOSPITAL_COMMUNITY)
Admission: EM | Admit: 2019-10-07 | Discharge: 2019-10-08 | Disposition: A | Discharge status: Home / Self Care | Payer: PPO | Attending: Emergency Medicine | Admitting: Emergency Medicine

## 2019-10-07 ENCOUNTER — Encounter (HOSPITAL_COMMUNITY): Payer: Self-pay | Admitting: Emergency Medicine

## 2019-10-07 DIAGNOSIS — Y990 Civilian activity done for income or pay: Secondary | ICD-10-CM | POA: Insufficient documentation

## 2019-10-07 DIAGNOSIS — W268XXA Contact with other sharp object(s), not elsewhere classified, initial encounter: Secondary | ICD-10-CM | POA: Insufficient documentation

## 2019-10-07 DIAGNOSIS — Y9389 Activity, other specified: Secondary | ICD-10-CM | POA: Diagnosis not present

## 2019-10-07 DIAGNOSIS — S61211A Laceration without foreign body of left index finger without damage to nail, initial encounter: Secondary | ICD-10-CM | POA: Diagnosis not present

## 2019-10-07 DIAGNOSIS — Z5321 Procedure and treatment not carried out due to patient leaving prior to being seen by health care provider: Secondary | ICD-10-CM | POA: Diagnosis not present

## 2019-10-07 DIAGNOSIS — Y929 Unspecified place or not applicable: Secondary | ICD-10-CM | POA: Diagnosis not present

## 2019-10-07 NOTE — ED Triage Notes (Signed)
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  Bleeding is controlled.  Unknown last tetanus.

## 2019-10-08 NOTE — ED Notes (Signed)
Pt called again, no response.

## 2019-10-08 NOTE — ED Notes (Signed)
Pt called for VS recheck x2, no response.  

## 2019-10-16 IMAGING — XA FLUORO GUIDED NEEDLE PLACEMENT AND/OR ASPIRATION
3 series · 3 of 3 positions shown · non-contrast
Comparison: none

CLINICAL DATA: Right shoulder pain and limited range of motion
after hyperextension injury.

[Series 1: ortho standard · 1 of 1 slices shown (1 of 3)]
[im 1/1]
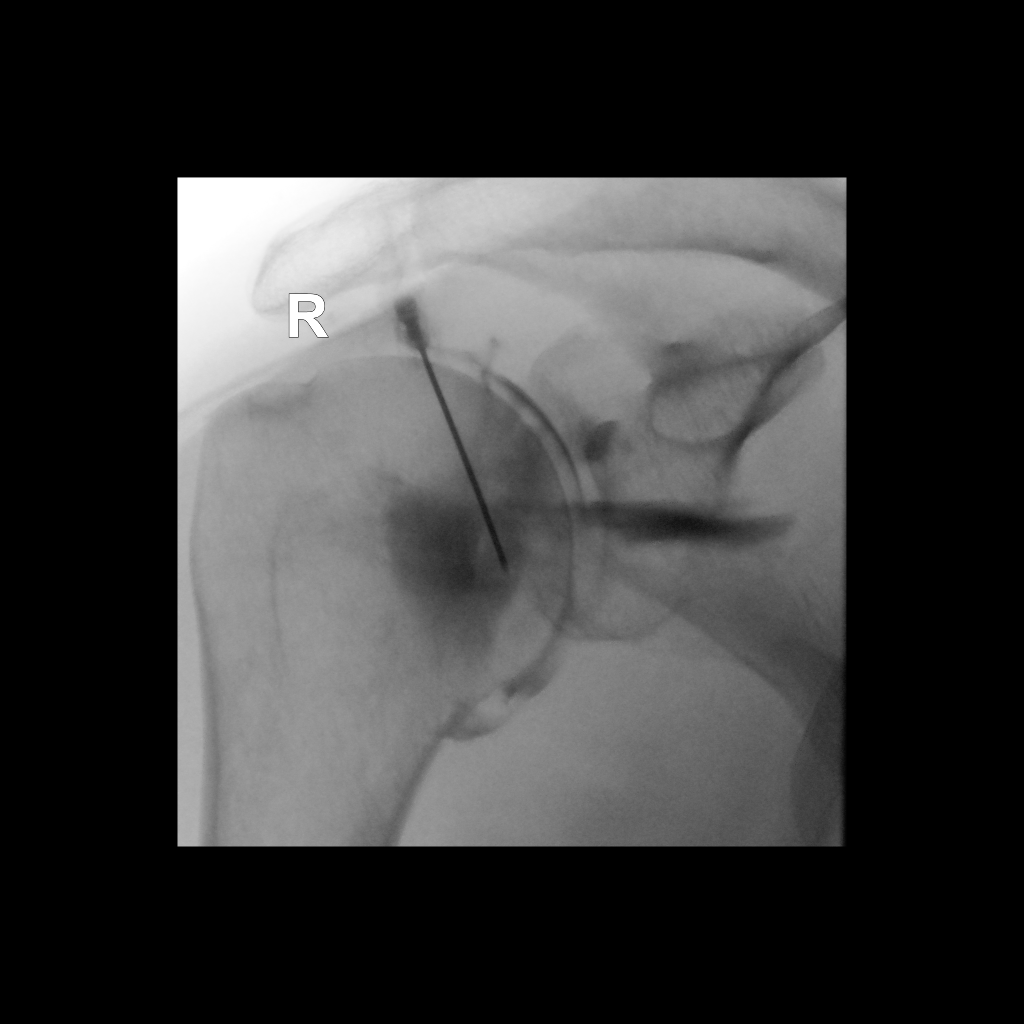

[Series 2: ortho standard · 1 of 1 slices shown (2 of 3)]
[im 1/1]
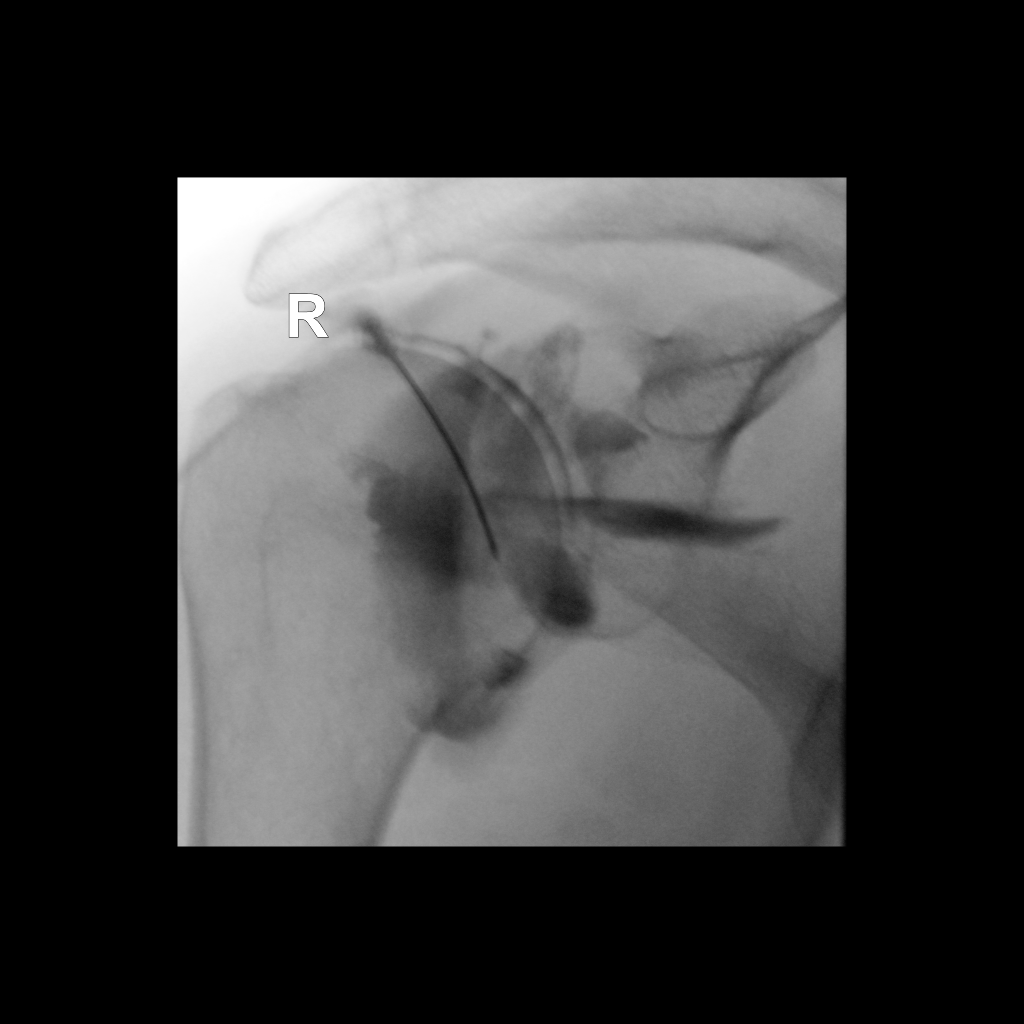

[Series 3: ortho standard · 1 of 1 slices shown (3 of 3)]
[im 1/1]
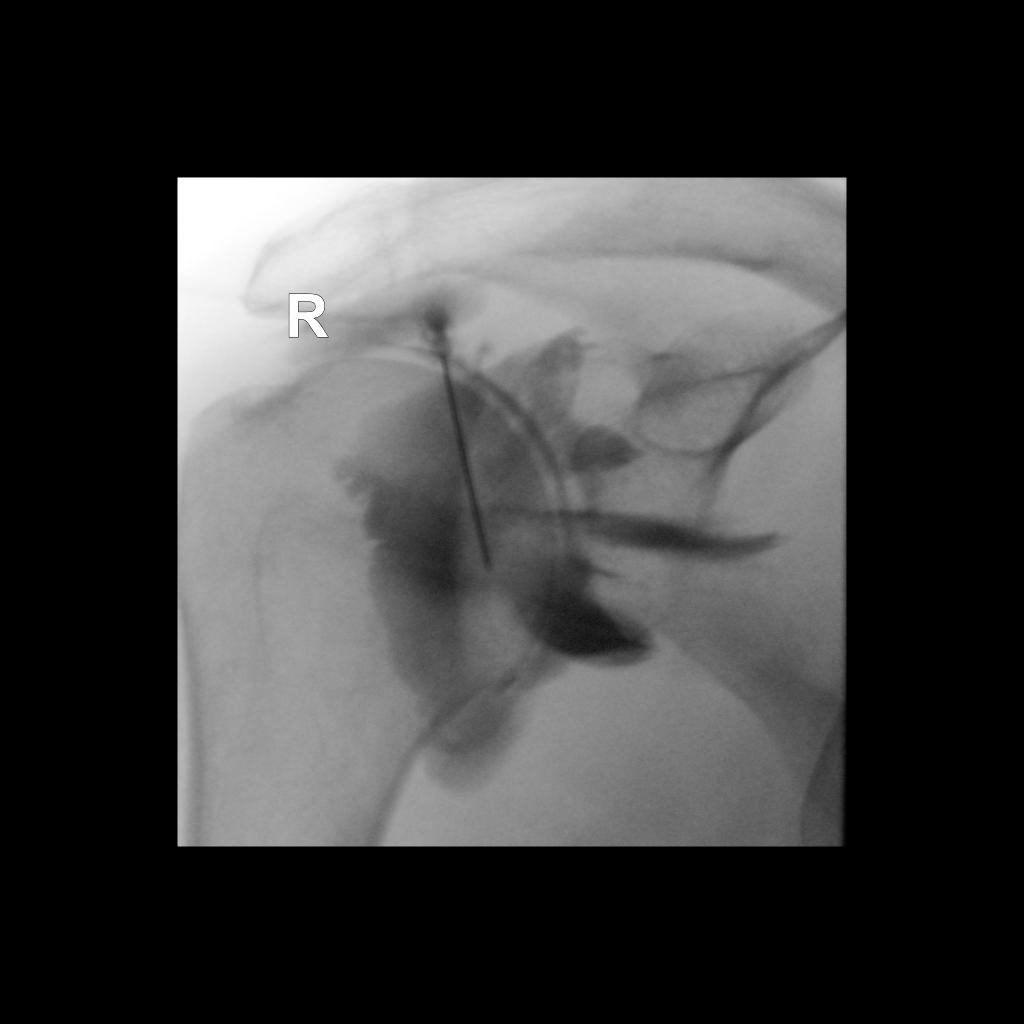

[3 of 3 positions shown; findings below may reference images not displayed]

FLUOROSCOPY TIME:  Radiation exposure: 19.4 mGy

PROCEDURE:
RIGHT SHOULDER INJECTION UNDER FLUOROSCOPY

Shoulder joint injection procedures well as the associated risks and
complications were discussed with the patient, and signed informed
consent was obtained. An appropriate skin entrance site was marked
using fluoroscopy. The site was marked, prepped with Betadine,
draped in the usual sterile fashion, and infiltrated locally with
buffered Lidocaine. 20 gauge spinal needle was advanced to
inferomedial margin of the humeral head under intermittent
fluoroscopy. A mixture of 0.1 ml Multihance and 20 ml of Omnipaque
200 was then used to opacify the right shoulder capsule. The needle
was subsequently removed, and a sterile bandage was applied to the
skin puncture site. No immediate complication is evident.
IMPRESSION: Technically successful right shoulder injection for MRI.

## 2019-10-16 IMAGING — MR MRI OF THE RIGHT SHOULDER WITH CONTRAST
6 series · 40 of 40 positions shown · IV contrast (agent unspecified)
Comparison: September 28, 2018

CLINICAL DATA: Shoulder pain, rotator cuff tear and impingement

EXAM:
MR ARTHROGRAM OF THE right SHOULDER
TECHNIQUE: Multiplanar, multisequence MR imaging of the right shoulder was
performed following the administration of intra-articular contrast.
CONTRAST:  See Injection Documentation.

[Series 3: T1 fat-sat · axial · 4.0mm · 0.27mm/px · z∈[-31,+70]mm · 8 of 22 slices shown (1 of 4)]
[im 1/22]
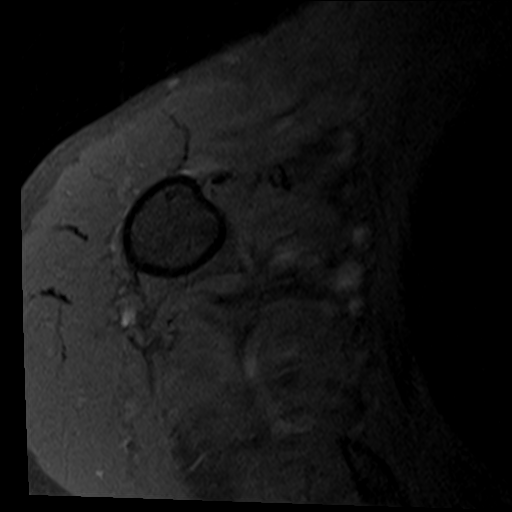
[im 4/22]
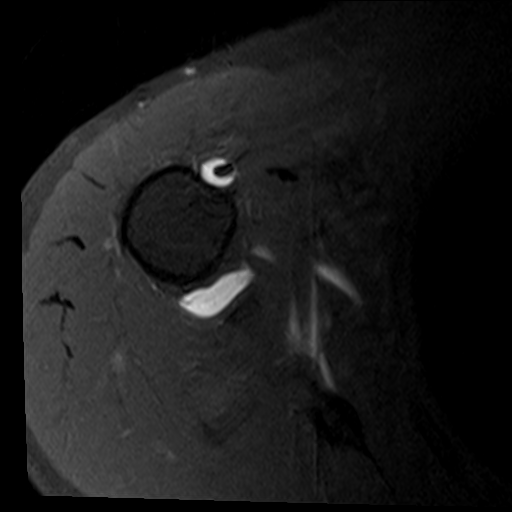
[im 7/22]
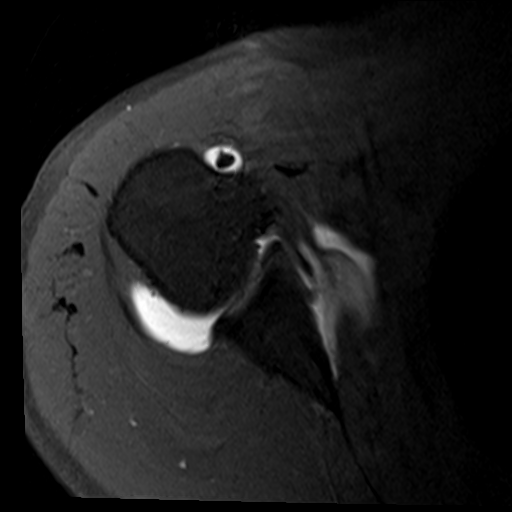
[im 10/22]
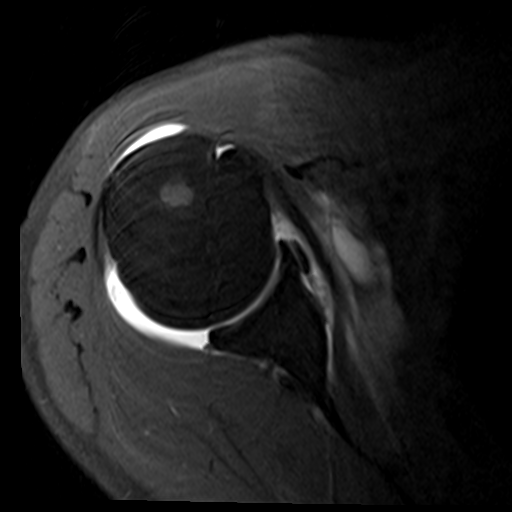
[im 13/22]
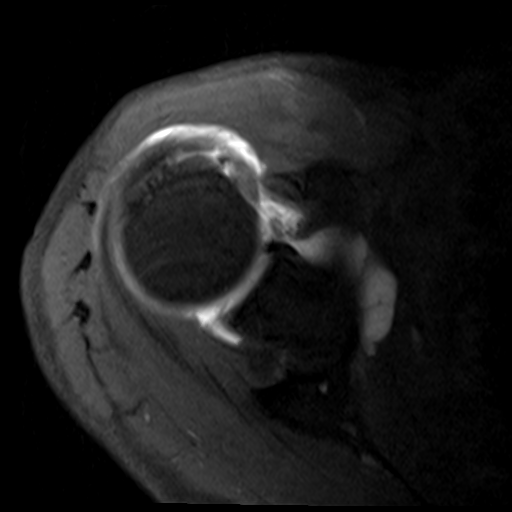
[im 16/22]
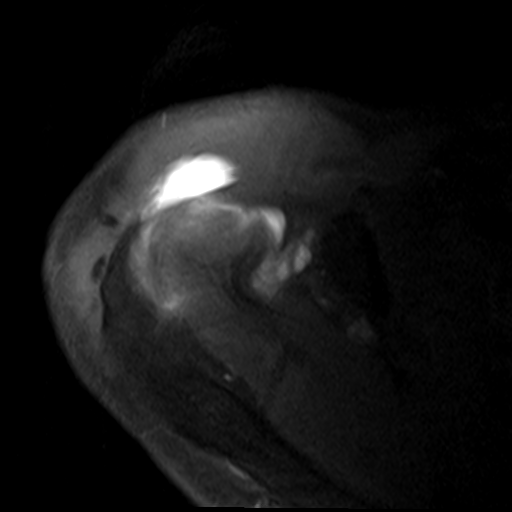
[im 19/22]
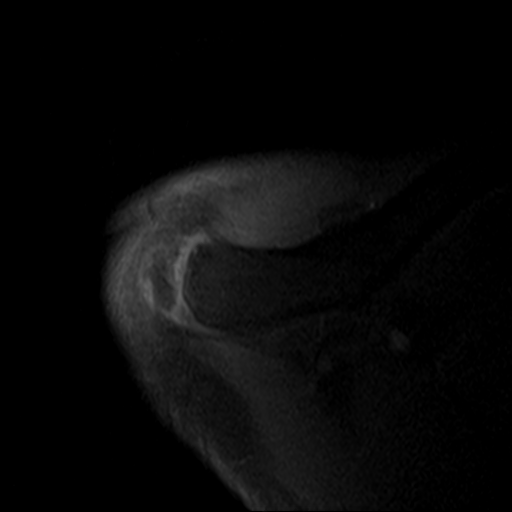
[im 22/22]
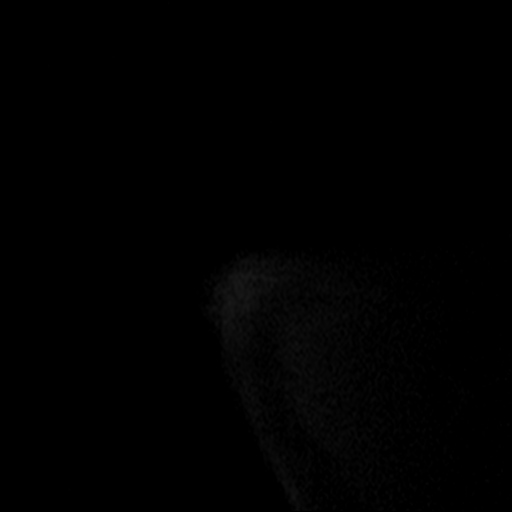

[Series 5: T1 fat-sat · oblique · 4.0mm · 0.55mm/px · 6 of 18 slices shown (2 of 4)]
[im 1/18]
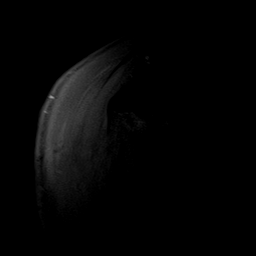
[im 4/18]
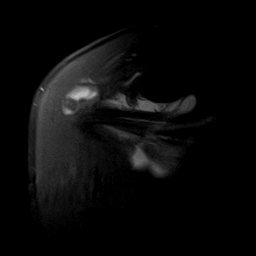
[im 7/18]
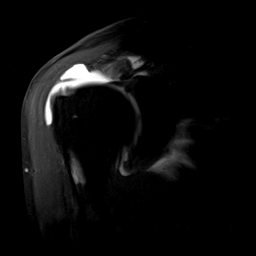
[im 11/18]
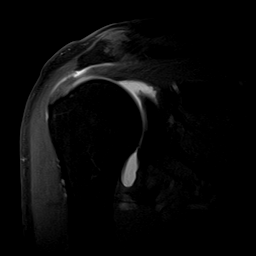
[im 14/18]
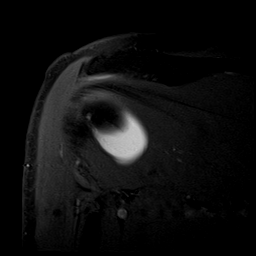
[im 18/18]
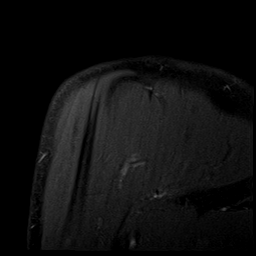

[Series 6: T1 fat-sat · oblique · 4.0mm · 0.55mm/px · 6 of 18 slices shown (3 of 4)]
[im 1/18]
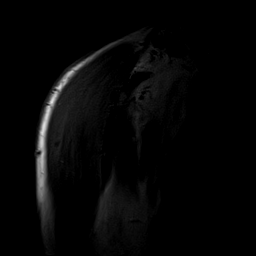
[im 4/18]
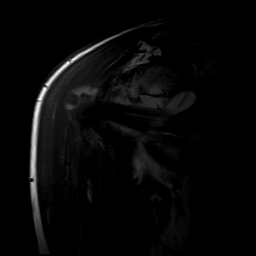
[im 7/18]
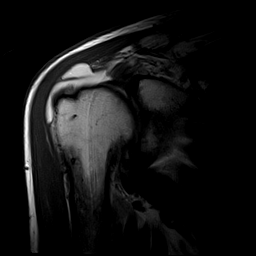
[im 11/18]
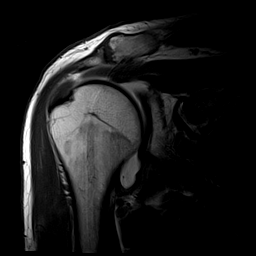
[im 14/18]
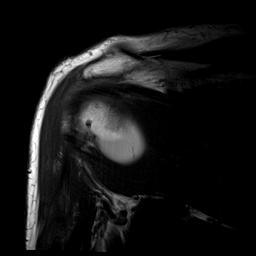
[im 18/18]
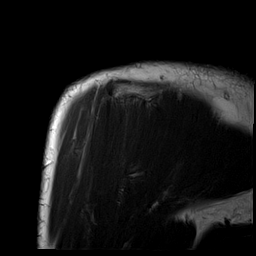

[Series 7: T2 fat-sat · oblique · 4.0mm · 0.55mm/px · 6 of 18 slices shown (1 of 2)]
[im 1/18]
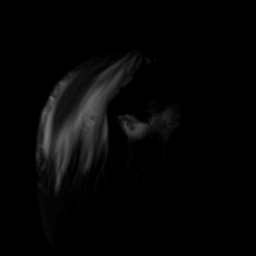
[im 4/18]
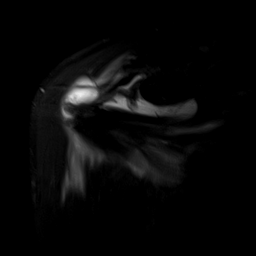
[im 7/18]
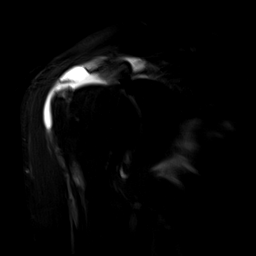
[im 11/18]
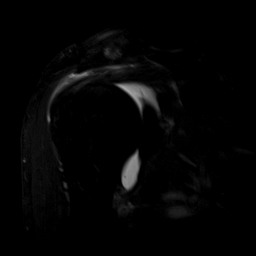
[im 14/18]
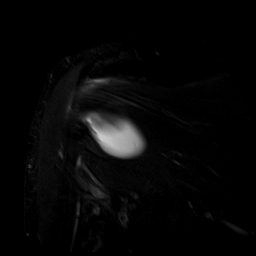
[im 18/18]
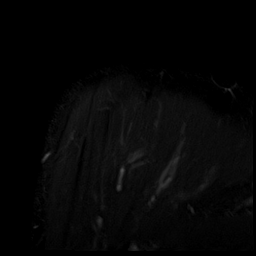

[Series 8: T2 fat-sat · oblique · 4.0mm · 0.55mm/px · 8 of 22 slices shown (2 of 2)]
[im 1/22]
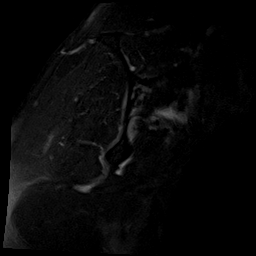
[im 4/22]
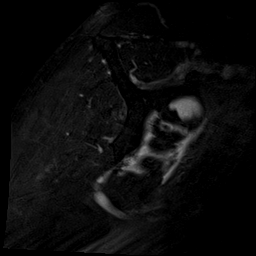
[im 7/22]
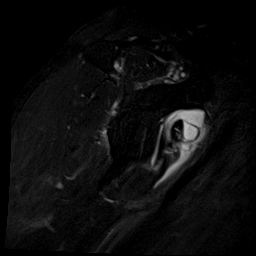
[im 10/22]
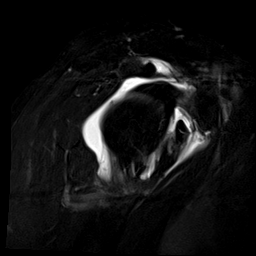
[im 13/22]
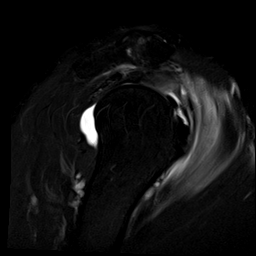
[im 16/22]
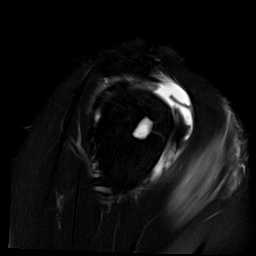
[im 19/22]
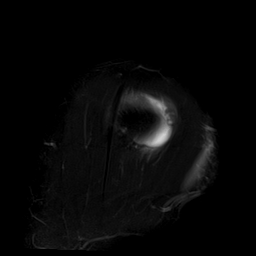
[im 22/22]
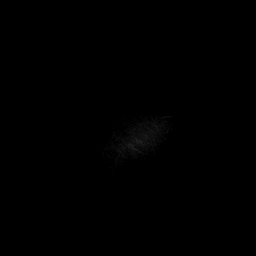

[Series 11: T1 fat-sat · sagittal · 4.0mm · 0.59mm/px · 6 of 16 slices shown (4 of 4)]
[im 1/16]
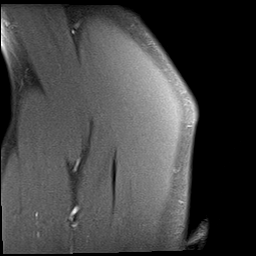
[im 4/16]
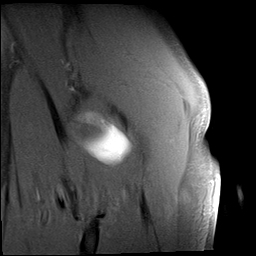
[im 7/16]
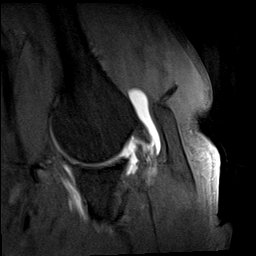
[im 10/16]
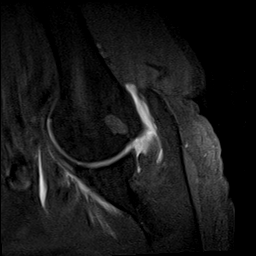
[im 13/16]
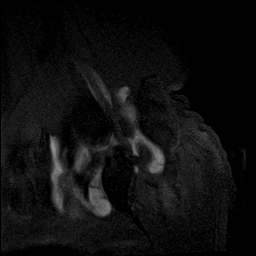
[im 16/16]
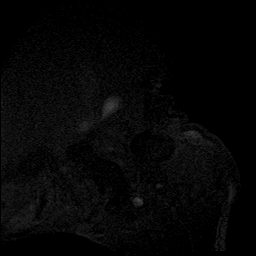

[40 of 40 positions shown; findings below may reference images not displayed]

FINDINGS: Rotator cuff: There is a focal full-thickness tear of the anterior
supraspinatus tendon 9 mm from the rotator cuff insertion site and
measuring 1.8 cm in transverse dimension and 1.4 cm in AP dimension.
There is retraction of the remainder of the tendon. There is fluid
within the subacromial-subdeltoid bursa with a slightly high-riding
humeral head. Increased signal and thickening is seen throughout the
infraspinatus tendon with bursal surface fraying. There is increased
globular signal seen within the subscapularis tendon. The teres
minor tendon is intact. The muscles of the rotator cuff are normal
without tear, edema, or atrophy.

Muscles: The muscles other than the rotator cuff are normal without
tear, edema, or atrophy.

Biceps Long Head: The Intraarticular and extraarticular portions of
the biceps tendon are normal in position, size and signal.

Acromioclavicular Joint: There is moderate AC joint arthrosis with
joint space loss and capsular hypertrophy. Type 2 acromion.

Glenohumeral Joint: There is a slightly high-riding humeral head.
The glenohumeral articular surface is well maintained. No focal
chondral defect.

Labrum: There is a nondisplaced anterosuperior labral tear. No
displaced labral fragment is seen.

Bones: No fracture, osteonecrosis, or pathologic marrow
infiltration. There is a T2 bright cystic lesion seen within the
lateral corner of the humeral head, likely simple bone cyst.

Other: Subacromial-subdeltoid bursal fluid is seen.
IMPRESSION: 1. Focal full-thickness tear of the anterior supraspinatus tendon
with retraction of fibers measuring 5.8x5.2 cm. Resultant slightly
high-riding humeral head and fluid in the subacromial-subdeltoid
bursa.
2. Infraspinatus and subscapularis tendinosis
3. Nondisplaced anterosuperior labral tear
4. Moderate AC joint arthrosis

## 2019-11-07 DIAGNOSIS — I639 Cerebral infarction, unspecified: Secondary | ICD-10-CM | POA: Diagnosis not present

## 2019-11-07 DIAGNOSIS — I10 Essential (primary) hypertension: Secondary | ICD-10-CM | POA: Diagnosis not present

## 2019-11-07 DIAGNOSIS — I63412 Cerebral infarction due to embolism of left middle cerebral artery: Secondary | ICD-10-CM | POA: Diagnosis not present

## 2019-11-07 DIAGNOSIS — E7800 Pure hypercholesterolemia, unspecified: Secondary | ICD-10-CM | POA: Diagnosis not present

## 2019-11-07 DIAGNOSIS — I480 Paroxysmal atrial fibrillation: Secondary | ICD-10-CM | POA: Diagnosis not present

## 2019-11-07 DIAGNOSIS — I252 Old myocardial infarction: Secondary | ICD-10-CM | POA: Diagnosis not present

## 2019-11-07 DIAGNOSIS — H35033 Hypertensive retinopathy, bilateral: Secondary | ICD-10-CM | POA: Diagnosis not present

## 2019-11-07 DIAGNOSIS — N183 Chronic kidney disease, stage 3 unspecified: Secondary | ICD-10-CM | POA: Diagnosis not present

## 2019-11-12 DIAGNOSIS — L719 Rosacea, unspecified: Secondary | ICD-10-CM | POA: Diagnosis not present

## 2019-11-12 DIAGNOSIS — B078 Other viral warts: Secondary | ICD-10-CM | POA: Diagnosis not present

## 2019-11-12 DIAGNOSIS — L820 Inflamed seborrheic keratosis: Secondary | ICD-10-CM | POA: Diagnosis not present

## 2019-11-21 DIAGNOSIS — N1832 Chronic kidney disease, stage 3b: Secondary | ICD-10-CM | POA: Diagnosis not present

## 2019-11-21 DIAGNOSIS — R399 Unspecified symptoms and signs involving the genitourinary system: Secondary | ICD-10-CM | POA: Diagnosis not present

## 2019-11-28 ENCOUNTER — Telehealth: Payer: Self-pay | Admitting: *Deleted

## 2019-11-28 NOTE — Telephone Encounter (Signed)
A detailed message was left,re: his follow up visit. °

## 2019-12-02 ENCOUNTER — Ambulatory Visit (INDEPENDENT_AMBULATORY_CARE_PROVIDER_SITE_OTHER): Payer: PPO | Admitting: *Deleted

## 2019-12-02 DIAGNOSIS — Z8673 Personal history of transient ischemic attack (TIA), and cerebral infarction without residual deficits: Secondary | ICD-10-CM | POA: Diagnosis not present

## 2019-12-02 LAB — CUP PACEART REMOTE DEVICE CHECK
Date Time Interrogation Session: 20210815002203
Implantable Pulse Generator Implant Date: 20190829

## 2019-12-03 NOTE — Progress Notes (Signed)
Carelink Summary Report / Loop Recorder 

## 2020-01-01 LAB — CUP PACEART REMOTE DEVICE CHECK
Date Time Interrogation Session: 20210915013805
Implantable Pulse Generator Implant Date: 20190829

## 2020-01-02 ENCOUNTER — Ambulatory Visit (INDEPENDENT_AMBULATORY_CARE_PROVIDER_SITE_OTHER): Payer: PPO | Admitting: *Deleted

## 2020-01-02 DIAGNOSIS — Z8673 Personal history of transient ischemic attack (TIA), and cerebral infarction without residual deficits: Secondary | ICD-10-CM | POA: Diagnosis not present

## 2020-01-06 NOTE — Progress Notes (Signed)
Carelink Summary Report / Loop Recorder 

## 2020-01-10 ENCOUNTER — Other Ambulatory Visit: Payer: Self-pay

## 2020-01-10 ENCOUNTER — Ambulatory Visit (HOSPITAL_COMMUNITY)
Admission: RE | Admit: 2020-01-10 | Discharge: 2020-01-10 | Disposition: A | Discharge status: Home / Self Care | Payer: PPO | Source: Ambulatory Visit | Attending: Cardiovascular Disease | Admitting: Cardiovascular Disease

## 2020-01-10 ENCOUNTER — Other Ambulatory Visit (HOSPITAL_COMMUNITY): Payer: Self-pay | Admitting: Cardiovascular Disease

## 2020-01-10 DIAGNOSIS — I6523 Occlusion and stenosis of bilateral carotid arteries: Secondary | ICD-10-CM | POA: Insufficient documentation

## 2020-01-15 ENCOUNTER — Telehealth: Payer: Self-pay | Admitting: Cardiovascular Disease

## 2020-01-15 NOTE — Telephone Encounter (Signed)
Follow Up:     Pt is returning a call from [facility], concerning his doppler results.

## 2020-01-15 NOTE — Telephone Encounter (Signed)
Left message for the pt to call back.

## 2020-01-16 DIAGNOSIS — H35033 Hypertensive retinopathy, bilateral: Secondary | ICD-10-CM | POA: Diagnosis not present

## 2020-01-16 DIAGNOSIS — I252 Old myocardial infarction: Secondary | ICD-10-CM | POA: Diagnosis not present

## 2020-01-16 DIAGNOSIS — I639 Cerebral infarction, unspecified: Secondary | ICD-10-CM | POA: Diagnosis not present

## 2020-01-16 DIAGNOSIS — I63412 Cerebral infarction due to embolism of left middle cerebral artery: Secondary | ICD-10-CM | POA: Diagnosis not present

## 2020-01-16 DIAGNOSIS — I480 Paroxysmal atrial fibrillation: Secondary | ICD-10-CM | POA: Diagnosis not present

## 2020-01-16 DIAGNOSIS — N183 Chronic kidney disease, stage 3 unspecified: Secondary | ICD-10-CM | POA: Diagnosis not present

## 2020-01-16 DIAGNOSIS — E7800 Pure hypercholesterolemia, unspecified: Secondary | ICD-10-CM | POA: Diagnosis not present

## 2020-01-16 DIAGNOSIS — I10 Essential (primary) hypertension: Secondary | ICD-10-CM | POA: Diagnosis not present

## 2020-01-16 NOTE — Telephone Encounter (Signed)
pt aware of results  

## 2020-01-17 DIAGNOSIS — R300 Dysuria: Secondary | ICD-10-CM | POA: Diagnosis not present

## 2020-01-17 DIAGNOSIS — N3001 Acute cystitis with hematuria: Secondary | ICD-10-CM | POA: Diagnosis not present

## 2020-01-22 ENCOUNTER — Other Ambulatory Visit: Payer: Self-pay | Admitting: Cardiovascular Disease

## 2020-01-22 MED ORDER — ATORVASTATIN CALCIUM 80 MG PO TABS: 80.0000 mg | ORAL_TABLET | Freq: Every day | ORAL | 2 refills | Status: DC

## 2020-01-22 NOTE — Telephone Encounter (Signed)
*  STAT* If patient is at the pharmacy, call can be transferred to refill team.   1. Which medications need to be refilled? (please list name of each medication and dose if known) atorvastatin (LIPITOR) 80 MG tablet  2. Which pharmacy/location (including street and city if local pharmacy) is medication to be sent to? [id number] - [place], [place] - [address] AT [address]  3. Do they need a 30 day or 90 day supply? 90 day   Patient is out of medication. He has not taken it in four days.

## 2020-01-23 ENCOUNTER — Other Ambulatory Visit: Payer: Self-pay

## 2020-01-23 DIAGNOSIS — I6523 Occlusion and stenosis of bilateral carotid arteries: Secondary | ICD-10-CM

## 2020-01-31 ENCOUNTER — Other Ambulatory Visit: Payer: Self-pay

## 2020-01-31 ENCOUNTER — Ambulatory Visit: Payer: PPO | Admitting: Cardiovascular Disease

## 2020-01-31 ENCOUNTER — Encounter: Payer: Self-pay | Admitting: Cardiovascular Disease

## 2020-01-31 ENCOUNTER — Ambulatory Visit (INDEPENDENT_AMBULATORY_CARE_PROVIDER_SITE_OTHER): Payer: PPO | Admitting: Cardiovascular Disease

## 2020-01-31 VITALS — BP 136/68 | HR 63 | Ht 74.0 in | Wt 184.0 lb

## 2020-01-31 DIAGNOSIS — I119 Hypertensive 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 heart failure: Secondary | ICD-10-CM | POA: Diagnosis not present

## 2020-01-31 DIAGNOSIS — I480 Paroxysmal atrial fibrillation: Secondary | ICD-10-CM | POA: Diagnosis not present

## 2020-01-31 DIAGNOSIS — I252 Old myocardial infarction: Secondary | ICD-10-CM | POA: Diagnosis not present

## 2020-01-31 DIAGNOSIS — I6523 Occlusion and stenosis of bilateral carotid arteries: Secondary | ICD-10-CM

## 2020-01-31 NOTE — Progress Notes (Signed)
01/31/2020 Trilby Drummer Najarian   March 02, 1951  [id number]  Primary Physician Antony Contras, MD Primary Cardiologist: Lorretta Harp MD Lupe Carney, [place]  HPI:  Billy [name] is a 69 y.o.  thin appearing divorced Caucasian male father to, grandfather of one grandchild who is retired from [employer] and was referred by Dr.McLeanto be established in my practice for ongoing cardiovascular care. I last saw him in my office 12/25/2018.  He lives with his elderly mother and takes care of her. He has a history of treated hypertension hyperlipidemia. He is never smoked. He is never had a heart attack. There is no family history. He denies chest pain or shortness of breath. He has had an echo that shows normal LV function with inferior akinesia. He did have a TIA/stroke and had a TEE for embolic source which was negative and had a Linq device implanted by Dr. Rayann Heman to rule out PAF as a etiology.His Linq recorder did show episodes of PAF for which she was begun on Eliquis oral anticoagulation.  He was admitted in late December for GI bleed and hypotension. He did undergo endoscopy by Dr. [name] apparently finding a gastric ulcer. His Eliquis was placed on hold. He also had a non-STEMI with a peak troponin of 17 although he denies chest pain. It was elected not to perform cardiac catheterization because of his GI bleed and moderate renal insufficiency with a known occluded left renal artery. He was also evaluated by Dr. Trula Slade for his carotid disease which were evaluated by duplex ultrasound in August revealing moderate bilateral ICA stenosis thought not to be related to his stroke.  Since I saw him in my office 1 year ago he is remained stable.  He denies chest pain or shortness of breath.  He does admit to not being very active however.  Carotid Dopplers performed 01/10/2020 showed mild progression of bilateral ICA stenosis in the moderate range.   Current Meds  Medication Sig  .  atorvastatin (LIPITOR) 80 MG tablet Take 1 tablet (80 mg total) by mouth daily.  . carvedilol (COREG) 25 MG tablet Take 25 mg by mouth 2 (two) times daily.   . furosemide (LASIX) 40 MG tablet Take 20 mg by mouth as needed.   . gabapentin (NEURONTIN) 300 MG capsule Take 300 mg by mouth daily as needed (pain).   . hydrALAZINE (APRESOLINE) 100 MG tablet Take 100 mg by mouth 3 (three) times daily.  . isosorbide mononitrate (IMDUR) 30 MG 24 hr tablet Take 1 tablet (30 mg total) by mouth daily.  . Multiple Vitamin (MULTIVITAMIN WITH MINERALS) TABS tablet Take 1 tablet by mouth daily.  . pramipexole (MIRAPEX) 0.125 MG tablet Take 0.125 mg by mouth at bedtime.      Allergies  Allergen Reactions  . Amlodipine     Excessive swelling and skin blotching   . Morphine And Related Itching and Other (See Comments)    "Cannot handle it"    Social History   Socioeconomic History  . Marital status: Divorced    Spouse name: Not on file  . Number of children: 2  . Years of education: 1  . Highest education level: Not on file  Occupational History  . 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  Tobacco Use  . Smoking status: Never Smoker  . Smokeless tobacco: Never Used  Vaping Use  . Vaping Use: Never used  Substance and Sexual Activity  . Alcohol use: No  . Drug use: Never  . Sexual activity: Not on file  Other Topics Concern  . Not on file  Social History Narrative   Raised in [place]   Caffeine use: coke sometimes   Lives with mother and cares for her currently.   Social Determinants of Health   Financial Resource Strain:   . Difficulty of Paying Living Expenses: Not on file  Food Insecurity:   . Worried About Charity fundraiser in the Last Year: Not on file  . Ran Out of Food in the Last Year: Not on file  Transportation Needs:   . Lack of Transportation (Medical): Not on file  . Lack of  Transportation (Non-Medical): Not on file  Physical Activity:   . Days of Exercise per Week: Not on file  . Minutes of Exercise per Session: Not on file  Stress:   . Feeling of Stress : Not on file  Social Connections:   . Frequency of Communication with Friends and Family: Not on file  . Frequency of Social Gatherings with Friends and Family: Not on file  . Attends Religious Services: Not on file  . Active Member of Clubs or Organizations: Not on file  . Attends Archivist Meetings: Not on file  . Marital Status: Not on file  Intimate Partner Violence:   . Fear of Current or Ex-Partner: Not on file  . Emotionally Abused: Not on file  . Physically Abused: Not on file  . Sexually Abused: Not on file     Review of Systems: General: negative for chills, fever, night sweats or weight changes.  Cardiovascular: negative for chest pain, dyspnea on exertion, edema, orthopnea, palpitations, paroxysmal nocturnal dyspnea or shortness of breath Dermatological: negative for rash Respiratory: negative for cough or wheezing Urologic: negative for hematuria Abdominal: negative for nausea, vomiting, diarrhea, bright red blood per rectum, melena, or hematemesis Neurologic: negative for visual changes, syncope, or dizziness All other systems reviewed and are otherwise negative except as noted above.    Blood pressure 136/68, pulse 63, height 6\' 2"  (1.88 m), weight 184 lb (83.5 kg).  General appearance: alert and no distress Neck: no adenopathy, no JVD, supple, symmetrical, trachea midline, thyroid not enlarged, symmetric, no tenderness/mass/nodules and Soft bilateral carotid bruits Lungs: clear to auscultation bilaterally Heart: regular rate and rhythm, S1, S2 normal, no murmur, click, rub or gallop Extremities: extremities normal, atraumatic, no cyanosis or edema Pulses: 2+ and symmetric Skin: Skin color, texture, turgor normal. No rashes or lesions Neurologic: Alert and oriented X 3,  normal strength and tone. Normal symmetric reflexes. Normal coordination and gait  EKG sinus rhythm at 63 with evidence of LVH with repolarization changes.  I personally reviewed this EKG.  ASSESSMENT AND PLAN:   Hypertensive 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 CHF History of essential hypertension blood pressure measured today 136/68.  He is on carvedilol, and hydralazine.  History of non-ST elevation myocardial infarction (NSTEMI) History of non-ST segment elevation myocardial infarction in the past echo in 2019 however he did not undergo coronary angiography because of GI bleeding and moderate renal insufficiency.  He said no recurrent symptoms.  PAF (paroxysmal atrial fibrillation) (HCC) History of PAF caught on the Linq monitoring implanted for the determination of stroke etiology.  He was on Eliquis in the past which was discontinued because of GI bleeding.  Carotid artery disease ([place]) History of bilateral ICA stenosis with mild progression demonstrated by duplex ultrasound 01/10/2020.  This will be repeated in 1 year.      Lorretta Harp MD FACP,FACC,FAHA, [facility] 01/31/2020 10:34 AM

## 2020-01-31 NOTE — Assessment & Plan Note (Signed)
History of bilateral ICA stenosis with mild progression demonstrated by duplex ultrasound 01/10/2020.  This will be repeated in 1 year.

## 2020-01-31 NOTE — Assessment & Plan Note (Signed)
History of essential hypertension blood pressure measured today 136/68.  He is on carvedilol, and hydralazine.

## 2020-01-31 NOTE — Patient Instructions (Signed)
Medication Instructions:  No changes *If you need a refill on your cardiac medications before your next appointment, please call your pharmacy*   Lab Work: None ordered If you have labs (blood work) drawn today and your tests are completely normal, you will receive your results only by: Marland Kitchen MyChart Message (if you have MyChart) OR . A paper copy in the mail If you have any lab test that is abnormal or we need to change your treatment, we will call you to review the results.   Testing/Procedures: Your physician has requested that you have a carotid duplex in September 2022. This test is an ultrasound of the carotid arteries in your neck. It looks at blood flow through these arteries that supply the brain with blood. Allow one hour for this exam. There are no restrictions or special instructions. This will take place at [place], [address].   Follow-Up: At [facility], you and your health needs are our priority.  As part of our continuing mission to provide you with exceptional heart care, we have created designated Provider Care Teams.  These Care Teams include your primary Cardiologist (physician) and Advanced Practice Providers (APPs -  Physician Assistants and Nurse Practitioners) who all work together to provide you with the care you need, when you need it.  We recommend signing up for the patient portal called "MyChart".  Sign up information is provided on this After Visit Summary.  MyChart is used to connect with patients for Virtual Visits (Telemedicine).  Patients are able to view lab/test results, encounter notes, upcoming appointments, etc.  Non-urgent messages can be sent to your provider as well.   To learn more about what you can do with MyChart, go to [email or web address].    Your next appointment:   12 month(s)  The format for your next appointment:   In Person  Provider:   You may see Quay Burow, MD or one of the following Advanced Practice Providers on  your designated Care Team:    Kerin Ransom, PA-C  [place], [place]  Coletta Memos, [place]

## 2020-01-31 NOTE — Assessment & Plan Note (Signed)
History of non-ST segment elevation myocardial infarction in the past echo in 2019 however he did not undergo coronary angiography because of GI bleeding and moderate renal insufficiency.  He said no recurrent symptoms.

## 2020-01-31 NOTE — Assessment & Plan Note (Signed)
History of PAF caught on the Linq monitoring implanted for the determination of stroke etiology.  He was on Eliquis in the past which was discontinued because of GI bleeding.

## 2020-02-27 DIAGNOSIS — N189 Chronic kidney disease, unspecified: Secondary | ICD-10-CM | POA: Diagnosis not present

## 2020-02-27 DIAGNOSIS — I5022 Chronic systolic (congestive) heart failure: Secondary | ICD-10-CM | POA: Diagnosis not present

## 2020-02-27 DIAGNOSIS — I129 Hypertensive chronic kidney disease with stage 1 through stage 4 chronic kidney disease, or unspecified chronic kidney disease: Secondary | ICD-10-CM | POA: Diagnosis not present

## 2020-02-27 DIAGNOSIS — D631 Anemia in chronic kidney disease: Secondary | ICD-10-CM | POA: Diagnosis not present

## 2020-02-27 DIAGNOSIS — Z86718 Personal history of other venous thrombosis and embolism: Secondary | ICD-10-CM | POA: Diagnosis not present

## 2020-02-27 DIAGNOSIS — I4891 Unspecified atrial fibrillation: Secondary | ICD-10-CM | POA: Diagnosis not present

## 2020-02-27 DIAGNOSIS — K922 Gastrointestinal hemorrhage, unspecified: Secondary | ICD-10-CM | POA: Diagnosis not present

## 2020-02-27 DIAGNOSIS — N2581 Secondary hyperparathyroidism of renal origin: Secondary | ICD-10-CM | POA: Diagnosis not present

## 2020-02-27 DIAGNOSIS — N1832 Chronic kidney disease, stage 3b: Secondary | ICD-10-CM | POA: Diagnosis not present

## 2020-03-17 DIAGNOSIS — E7800 Pure hypercholesterolemia, unspecified: Secondary | ICD-10-CM | POA: Diagnosis not present

## 2020-03-17 DIAGNOSIS — I639 Cerebral infarction, unspecified: Secondary | ICD-10-CM | POA: Diagnosis not present

## 2020-03-17 DIAGNOSIS — G4700 Insomnia, unspecified: Secondary | ICD-10-CM | POA: Diagnosis not present

## 2020-03-17 DIAGNOSIS — H35033 Hypertensive retinopathy, bilateral: Secondary | ICD-10-CM | POA: Diagnosis not present

## 2020-03-17 DIAGNOSIS — N183 Chronic kidney disease, stage 3 unspecified: Secondary | ICD-10-CM | POA: Diagnosis not present

## 2020-03-17 DIAGNOSIS — I252 Old myocardial infarction: Secondary | ICD-10-CM | POA: Diagnosis not present

## 2020-03-17 DIAGNOSIS — I10 Essential (primary) hypertension: Secondary | ICD-10-CM | POA: Diagnosis not present

## 2020-03-17 DIAGNOSIS — I63412 Cerebral infarction due to embolism of left middle cerebral artery: Secondary | ICD-10-CM | POA: Diagnosis not present

## 2020-03-17 DIAGNOSIS — I480 Paroxysmal atrial fibrillation: Secondary | ICD-10-CM | POA: Diagnosis not present

## 2020-03-17 DIAGNOSIS — N1832 Chronic kidney disease, stage 3b: Secondary | ICD-10-CM | POA: Diagnosis not present

## 2020-04-03 LAB — CUP PACEART REMOTE DEVICE CHECK
Date Time Interrogation Session: 20211217030542
Implantable Pulse Generator Implant Date: 20190829

## 2020-04-07 ENCOUNTER — Ambulatory Visit (INDEPENDENT_AMBULATORY_CARE_PROVIDER_SITE_OTHER): Payer: PPO

## 2020-04-07 DIAGNOSIS — Z8673 Personal history of transient ischemic attack (TIA), and cerebral infarction without residual deficits: Secondary | ICD-10-CM

## 2020-04-21 NOTE — Progress Notes (Signed)
Carelink Summary Report / Loop Recorder 

## 2020-05-05 DIAGNOSIS — E7800 Pure hypercholesterolemia, unspecified: Secondary | ICD-10-CM | POA: Diagnosis not present

## 2020-05-05 DIAGNOSIS — I10 Essential (primary) hypertension: Secondary | ICD-10-CM | POA: Diagnosis not present

## 2020-05-05 DIAGNOSIS — H35033 Hypertensive retinopathy, bilateral: Secondary | ICD-10-CM | POA: Diagnosis not present

## 2020-05-05 DIAGNOSIS — I639 Cerebral infarction, unspecified: Secondary | ICD-10-CM | POA: Diagnosis not present

## 2020-05-05 DIAGNOSIS — I480 Paroxysmal atrial fibrillation: Secondary | ICD-10-CM | POA: Diagnosis not present

## 2020-05-05 DIAGNOSIS — G4700 Insomnia, unspecified: Secondary | ICD-10-CM | POA: Diagnosis not present

## 2020-05-05 DIAGNOSIS — N1832 Chronic kidney disease, stage 3b: Secondary | ICD-10-CM | POA: Diagnosis not present

## 2020-05-05 DIAGNOSIS — I63412 Cerebral infarction due to embolism of left middle cerebral artery: Secondary | ICD-10-CM | POA: Diagnosis not present

## 2020-05-05 DIAGNOSIS — N183 Chronic kidney disease, stage 3 unspecified: Secondary | ICD-10-CM | POA: Diagnosis not present

## 2020-05-05 DIAGNOSIS — I252 Old myocardial infarction: Secondary | ICD-10-CM | POA: Diagnosis not present

## 2020-05-12 DIAGNOSIS — G629 Polyneuropathy, unspecified: Secondary | ICD-10-CM | POA: Diagnosis not present

## 2020-05-12 DIAGNOSIS — Z8673 Personal history of transient ischemic attack (TIA), and cerebral infarction without residual deficits: Secondary | ICD-10-CM | POA: Diagnosis not present

## 2020-05-12 DIAGNOSIS — Z8616 Personal history of COVID-19: Secondary | ICD-10-CM | POA: Diagnosis not present

## 2020-05-12 DIAGNOSIS — I10 Essential (primary) hypertension: Secondary | ICD-10-CM | POA: Diagnosis not present

## 2020-05-12 DIAGNOSIS — R609 Edema, unspecified: Secondary | ICD-10-CM | POA: Diagnosis not present

## 2020-05-12 DIAGNOSIS — E7800 Pure hypercholesterolemia, unspecified: Secondary | ICD-10-CM | POA: Diagnosis not present

## 2020-05-12 DIAGNOSIS — G2581 Restless legs syndrome: Secondary | ICD-10-CM | POA: Diagnosis not present

## 2020-05-12 DIAGNOSIS — I252 Old myocardial infarction: Secondary | ICD-10-CM | POA: Diagnosis not present

## 2020-05-12 DIAGNOSIS — N183 Chronic kidney disease, stage 3 unspecified: Secondary | ICD-10-CM | POA: Diagnosis not present

## 2020-05-12 DIAGNOSIS — I480 Paroxysmal atrial fibrillation: Secondary | ICD-10-CM | POA: Diagnosis not present

## 2020-06-24 DIAGNOSIS — I129 Hypertensive chronic kidney disease with stage 1 through stage 4 chronic kidney disease, or unspecified chronic kidney disease: Secondary | ICD-10-CM | POA: Diagnosis not present

## 2020-06-24 DIAGNOSIS — N189 Chronic kidney disease, unspecified: Secondary | ICD-10-CM | POA: Diagnosis not present

## 2020-06-24 DIAGNOSIS — K922 Gastrointestinal hemorrhage, unspecified: Secondary | ICD-10-CM | POA: Diagnosis not present

## 2020-06-24 DIAGNOSIS — D631 Anemia in chronic kidney disease: Secondary | ICD-10-CM | POA: Diagnosis not present

## 2020-06-24 DIAGNOSIS — N2581 Secondary hyperparathyroidism of renal origin: Secondary | ICD-10-CM | POA: Diagnosis not present

## 2020-06-24 DIAGNOSIS — Z86718 Personal history of other venous thrombosis and embolism: Secondary | ICD-10-CM | POA: Diagnosis not present

## 2020-06-24 DIAGNOSIS — N1832 Chronic kidney disease, stage 3b: Secondary | ICD-10-CM | POA: Diagnosis not present

## 2020-06-24 DIAGNOSIS — I4891 Unspecified atrial fibrillation: Secondary | ICD-10-CM | POA: Diagnosis not present

## 2020-06-24 DIAGNOSIS — I5022 Chronic systolic (congestive) heart failure: Secondary | ICD-10-CM | POA: Diagnosis not present

## 2020-07-16 DIAGNOSIS — I639 Cerebral infarction, unspecified: Secondary | ICD-10-CM | POA: Diagnosis not present

## 2020-07-16 DIAGNOSIS — I480 Paroxysmal atrial fibrillation: Secondary | ICD-10-CM | POA: Diagnosis not present

## 2020-07-16 DIAGNOSIS — I10 Essential (primary) hypertension: Secondary | ICD-10-CM | POA: Diagnosis not present

## 2020-07-16 DIAGNOSIS — I252 Old myocardial infarction: Secondary | ICD-10-CM | POA: Diagnosis not present

## 2020-07-16 DIAGNOSIS — H35033 Hypertensive retinopathy, bilateral: Secondary | ICD-10-CM | POA: Diagnosis not present

## 2020-07-16 DIAGNOSIS — N183 Chronic kidney disease, stage 3 unspecified: Secondary | ICD-10-CM | POA: Diagnosis not present

## 2020-07-16 DIAGNOSIS — N1832 Chronic kidney disease, stage 3b: Secondary | ICD-10-CM | POA: Diagnosis not present

## 2020-07-16 DIAGNOSIS — G4700 Insomnia, unspecified: Secondary | ICD-10-CM | POA: Diagnosis not present

## 2020-07-16 DIAGNOSIS — I63412 Cerebral infarction due to embolism of left middle cerebral artery: Secondary | ICD-10-CM | POA: Diagnosis not present

## 2020-07-16 DIAGNOSIS — E7800 Pure hypercholesterolemia, unspecified: Secondary | ICD-10-CM | POA: Diagnosis not present

## 2020-07-17 DIAGNOSIS — M7752 Other enthesopathy of left foot: Secondary | ICD-10-CM | POA: Diagnosis not present

## 2020-07-17 DIAGNOSIS — M79672 Pain in left foot: Secondary | ICD-10-CM | POA: Diagnosis not present

## 2020-08-19 DIAGNOSIS — T485X5A Adverse effect of other anti-common-cold drugs, initial encounter: Secondary | ICD-10-CM | POA: Diagnosis not present

## 2020-08-19 DIAGNOSIS — J343 Hypertrophy of nasal turbinates: Secondary | ICD-10-CM | POA: Diagnosis not present

## 2020-08-19 DIAGNOSIS — J310 Chronic rhinitis: Secondary | ICD-10-CM | POA: Diagnosis not present

## 2020-09-02 DIAGNOSIS — J310 Chronic rhinitis: Secondary | ICD-10-CM | POA: Diagnosis not present

## 2020-09-08 DIAGNOSIS — J343 Hypertrophy of nasal turbinates: Secondary | ICD-10-CM | POA: Diagnosis not present

## 2020-09-21 DIAGNOSIS — J343 Hypertrophy of nasal turbinates: Secondary | ICD-10-CM | POA: Diagnosis not present

## 2020-09-21 DIAGNOSIS — J342 Deviated nasal septum: Secondary | ICD-10-CM | POA: Diagnosis not present

## 2020-10-05 DIAGNOSIS — N1832 Chronic kidney disease, stage 3b: Secondary | ICD-10-CM | POA: Diagnosis not present

## 2020-10-05 DIAGNOSIS — E7800 Pure hypercholesterolemia, unspecified: Secondary | ICD-10-CM | POA: Diagnosis not present

## 2020-10-05 DIAGNOSIS — I639 Cerebral infarction, unspecified: Secondary | ICD-10-CM | POA: Diagnosis not present

## 2020-10-05 DIAGNOSIS — H35033 Hypertensive retinopathy, bilateral: Secondary | ICD-10-CM | POA: Diagnosis not present

## 2020-10-05 DIAGNOSIS — G4700 Insomnia, unspecified: Secondary | ICD-10-CM | POA: Diagnosis not present

## 2020-10-05 DIAGNOSIS — I480 Paroxysmal atrial fibrillation: Secondary | ICD-10-CM | POA: Diagnosis not present

## 2020-10-05 DIAGNOSIS — I10 Essential (primary) hypertension: Secondary | ICD-10-CM | POA: Diagnosis not present

## 2020-10-05 DIAGNOSIS — I63412 Cerebral infarction due to embolism of left middle cerebral artery: Secondary | ICD-10-CM | POA: Diagnosis not present

## 2020-10-10 DIAGNOSIS — M533 Sacrococcygeal disorders, not elsewhere classified: Secondary | ICD-10-CM | POA: Diagnosis not present

## 2020-10-11 ENCOUNTER — Other Ambulatory Visit: Payer: Self-pay | Admitting: Cardiovascular Disease

## 2020-10-28 DIAGNOSIS — I129 Hypertensive chronic kidney disease with stage 1 through stage 4 chronic kidney disease, or unspecified chronic kidney disease: Secondary | ICD-10-CM | POA: Diagnosis not present

## 2020-10-28 DIAGNOSIS — D631 Anemia in chronic kidney disease: Secondary | ICD-10-CM | POA: Diagnosis not present

## 2020-10-28 DIAGNOSIS — N2581 Secondary hyperparathyroidism of renal origin: Secondary | ICD-10-CM | POA: Diagnosis not present

## 2020-10-28 DIAGNOSIS — N189 Chronic kidney disease, unspecified: Secondary | ICD-10-CM | POA: Diagnosis not present

## 2020-10-28 DIAGNOSIS — I701 Atherosclerosis of renal artery: Secondary | ICD-10-CM | POA: Diagnosis not present

## 2020-10-28 DIAGNOSIS — N1832 Chronic kidney disease, stage 3b: Secondary | ICD-10-CM | POA: Diagnosis not present

## 2020-10-28 DIAGNOSIS — I4891 Unspecified atrial fibrillation: Secondary | ICD-10-CM | POA: Diagnosis not present

## 2020-11-16 DIAGNOSIS — I8310 Varicose veins of unspecified lower extremity with inflammation: Secondary | ICD-10-CM | POA: Diagnosis not present

## 2020-11-16 DIAGNOSIS — L819 Disorder of pigmentation, unspecified: Secondary | ICD-10-CM | POA: Diagnosis not present

## 2020-11-16 DIAGNOSIS — L812 Freckles: Secondary | ICD-10-CM | POA: Diagnosis not present

## 2020-11-16 DIAGNOSIS — D1801 Hemangioma of skin and subcutaneous tissue: Secondary | ICD-10-CM | POA: Diagnosis not present

## 2020-11-16 DIAGNOSIS — D229 Melanocytic nevi, unspecified: Secondary | ICD-10-CM | POA: Diagnosis not present

## 2020-11-16 DIAGNOSIS — L814 Other melanin hyperpigmentation: Secondary | ICD-10-CM | POA: Diagnosis not present

## 2020-11-16 DIAGNOSIS — L821 Other seborrheic keratosis: Secondary | ICD-10-CM | POA: Diagnosis not present

## 2020-11-16 DIAGNOSIS — L718 Other rosacea: Secondary | ICD-10-CM | POA: Diagnosis not present

## 2020-11-16 DIAGNOSIS — L570 Actinic keratosis: Secondary | ICD-10-CM | POA: Diagnosis not present

## 2020-11-30 ENCOUNTER — Telehealth: Payer: Self-pay | Admitting: Cardiovascular Disease

## 2020-11-30 MED ORDER — ISOSORBIDE MONONITRATE ER 30 MG PO TB24: 30.0000 mg | ORAL_TABLET | Freq: Every day | ORAL | 3 refills | Status: DC

## 2020-11-30 NOTE — Telephone Encounter (Signed)
Spoke to patient he stated he needs refill for Isosorbide.Refill sent to pharmacy.

## 2020-11-30 NOTE — Telephone Encounter (Signed)
*  STAT* If patient is at the pharmacy, call can be transferred to refill team.   1. Which medications need to be refilled? (please list name of each medication and dose if known)  isosorbide mononitrate (IMDUR) 30 MG 24 hr tablet  2. Which pharmacy/location (including street and city if local pharmacy) is medication to be sent to?  [id number]- [place], [place] - [address]. Do they need a 30 day or 90 day supply? [address]

## 2020-12-01 ENCOUNTER — Other Ambulatory Visit: Payer: Self-pay

## 2020-12-01 ENCOUNTER — Encounter: Payer: PPO | Attending: Nephrology | Admitting: Skilled Nursing Facility1

## 2020-12-01 ENCOUNTER — Encounter: Payer: Self-pay | Admitting: Skilled Nursing Facility1

## 2020-12-01 DIAGNOSIS — N1831 Chronic kidney disease, stage 3a: Secondary | ICD-10-CM | POA: Diagnosis not present

## 2020-12-01 NOTE — Progress Notes (Signed)
Medical Nutrition Therapy    Primary concerns today: to lose weight  Referral diagnosis: CKD stage 3   NUTRITION ASSESSMENT    Clinical Medical Hx: stage 3 kidney disease, HTN, MI Medications: see list  Labs: WNL other than renal labs indicating disease  Notable Signs/Symptoms: low in in energy  Lifestyle & Dietary Hx  Pt state sh lives by himself and cannot exercise like he used to due to not having the energy. Pt states she wants to lose weight.  Pt states it is pretty usual for him to skip meals and does have a sweet tooth.  Pt states it would be hardest to stop drinking milk.  Pt states he think she will try dome cereal for his sweet cravings and will try stevia on it.  Pt states he is not a good cook and doesn't know much about it.   Estimated daily fluid intake: unknown oz Supplements: multivitamin  Sleep: urinating in the night interrupting sleep, sleeping in his recliner due to trouble breathing through his nose Stress / self-care: no self care issues but some stress Current average weekly physical activity: some walking   24-Hr Dietary Recall First Meal: peanut butter jelly sandwich Snack:  Second Meal:  Snack:  Third Meal: frozen meal Snack: ice cream Beverages: 1% milk (gallon in 3 days)  Estimated Energy Needs Calories: 1800   NUTRITION INTERVENTION  Nutrition education (E-1) on the following topics:  Cooking methods Fluid recommendations Sources of sodium Appropriate weight for his age, height and diagnoses   Handouts Provided Include  Detailed Myplate Low sodium seasonings   Learning Style & Readiness for Change Teaching method utilized: Visual & Auditory  Demonstrated degree of understanding via: Teach Back  Barriers to learning/adherence to lifestyle change: new to cooking/poor historian  Goals Established by Pt Drink 2-3 glasses of water a day Limit sweets to 3 times a week; try cereal with stevia instead Try getting an air fryer Try  frozen vegetables that you just put in the microwave Try out the recipe websites  Buy the seasoning already put together like the ones marked for chicken or vegetables or steak etc. Try out youtube for cooking ideas Try old bay or vinegar for a stronger flavor in your foods  Be sure to eat breakfats, lunch, and dinner every day   MONITORING & EVALUATION Dietary intake, weekly physical activity  Next Steps  Patient is to call as needed.

## 2020-12-29 DIAGNOSIS — N3000 Acute cystitis without hematuria: Secondary | ICD-10-CM | POA: Diagnosis not present

## 2021-01-08 ENCOUNTER — Telehealth: Payer: Self-pay | Admitting: Cardiovascular Disease

## 2021-01-08 DIAGNOSIS — I6523 Occlusion and stenosis of bilateral carotid arteries: Secondary | ICD-10-CM

## 2021-01-08 NOTE — Telephone Encounter (Signed)
  Please re-enter carotid order, pt would like to get carotid on 10/11, the ordered expired on 10/07

## 2021-01-08 NOTE — Telephone Encounter (Signed)
New order placed

## 2021-01-12 ENCOUNTER — Other Ambulatory Visit: Payer: Self-pay | Admitting: *Deleted

## 2021-01-12 MED ORDER — ATORVASTATIN CALCIUM 80 MG PO TABS: 80.0000 mg | ORAL_TABLET | Freq: Every day | ORAL | 0 refills | Status: DC

## 2021-01-22 ENCOUNTER — Ambulatory Visit (HOSPITAL_COMMUNITY): Payer: PPO

## 2021-01-26 ENCOUNTER — Ambulatory Visit (HOSPITAL_COMMUNITY)
Admission: RE | Admit: 2021-01-26 | Discharge: 2021-01-26 | Disposition: A | Discharge status: Home / Self Care | Payer: PPO | Source: Ambulatory Visit | Attending: Cardiology | Admitting: Cardiology

## 2021-01-26 ENCOUNTER — Other Ambulatory Visit: Payer: Self-pay

## 2021-01-26 DIAGNOSIS — I6523 Occlusion and stenosis of bilateral carotid arteries: Secondary | ICD-10-CM | POA: Insufficient documentation

## 2021-02-09 ENCOUNTER — Encounter: Payer: Self-pay | Admitting: Cardiovascular Disease

## 2021-02-09 ENCOUNTER — Other Ambulatory Visit: Payer: Self-pay

## 2021-02-09 ENCOUNTER — Ambulatory Visit: Payer: PPO | Admitting: Cardiovascular Disease

## 2021-02-09 VITALS — BP 122/60 | HR 55 | Ht 74.0 in | Wt 195.2 lb

## 2021-02-09 DIAGNOSIS — E782 Mixed hyperlipidemia: Secondary | ICD-10-CM | POA: Diagnosis not present

## 2021-02-09 DIAGNOSIS — I480 Paroxysmal atrial fibrillation: Secondary | ICD-10-CM | POA: Diagnosis not present

## 2021-02-09 DIAGNOSIS — I252 Old myocardial infarction: Secondary | ICD-10-CM

## 2021-02-09 DIAGNOSIS — I10 Essential (primary) hypertension: Secondary | ICD-10-CM | POA: Diagnosis not present

## 2021-02-09 DIAGNOSIS — I6523 Occlusion and stenosis of bilateral carotid arteries: Secondary | ICD-10-CM

## 2021-02-09 NOTE — Assessment & Plan Note (Signed)
History of prior stroke with Linq recorder demonstrating PAF however his oral anticoagulation was discontinued because of GI bleeding.

## 2021-02-09 NOTE — Progress Notes (Addendum)
02/09/2021 Billy [name]   October 09, 1950  [id number]  Primary Physician Antony Contras, MD Primary Cardiologist: Lorretta Harp MD Lupe Carney, [place]  HPI:  Jyquan Kenley is a 70 y.o. thin appearing divorced Caucasian male father to, grandfather of one grandchild who is retired from [employer] and was referred by Dr. Aundra Dubin to be established in my practice for ongoing cardiovascular care.  I last saw him in my office 01/31/2020.  He lived  with his elderly mother who he took care of but unfortunately she passed away back in 01-Aug-2018.  He currently lives alone..  He has a history of treated hypertension hyperlipidemia.  He is never smoked.  He is never had a heart attack.  There is no family history.  He denies chest pain or shortness of breath.  He has had an echo that shows normal LV function with inferior akinesia.  He did have a TIA/stroke and had a TEE for embolic source which was negative and had a Linq device implanted by Dr. Rayann Heman to rule out PAF as a etiology.  His Linq recorder did show episodes of PAF for which she was begun on Eliquis oral anticoagulation.   He was admitted in late December 2019 for GI bleed and hypotension.  He did undergo endoscopy by Dr. [name] apparently finding a gastric ulcer.  His Eliquis was placed on hold.  He also had a non-STEMI with a peak troponin of 17 although he denies chest pain.  It was elected not to perform cardiac catheterization because of his GI bleed and moderate renal insufficiency with a known occluded left renal artery.  He was also evaluated by Dr. Trula Slade for his carotid disease which were evaluated by duplex ultrasound in August revealing moderate bilateral ICA stenosis thought not to be related to his stroke.   Since I saw him in my office 1 year ago he is remained stable.  He denies chest pain or shortness of breath.  He does admit to not being very active however.  He has gained 11 pounds.  Carotid Dopplers performed 01/26/2021 showed  moderate bilateral ICA stenosis right greater than left.  Current Meds  Medication Sig   atorvastatin (LIPITOR) 80 MG tablet Take 1 tablet (80 mg total) by mouth daily.   carvedilol (COREG) 25 MG tablet Take 25 mg by mouth 2 (two) times daily.    gabapentin (NEURONTIN) 300 MG capsule Take 300 mg by mouth daily as needed (pain).    isosorbide mononitrate (IMDUR) 30 MG 24 hr tablet Take 1 tablet (30 mg total) by mouth daily.   Multiple Vitamin (MULTIVITAMIN WITH MINERALS) TABS tablet Take 1 tablet by mouth daily.   pramipexole (MIRAPEX) 0.125 MG tablet Take 0.125 mg by mouth at bedtime.      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  Tobacco Use   Smoking status: Never   Smokeless tobacco: Never  Vaping Use   Vaping  Use: Never used  Substance and Sexual Activity   Alcohol use: No   Drug use: Never   Sexual activity: Not on file  Other Topics Concern   Not on file  Social History Narrative   Raised in [place]   Caffeine use: coke sometimes   Lives with mother and cares for her currently.   Social Determinants of Health   Financial Resource Strain: Not on file  Food Insecurity: Not on file  Transportation Needs: Not on file  Physical Activity: Not on file  Stress: Not on file  Social Connections: Not on file  Intimate Partner Violence: Not on file     Review of Systems: General: negative for chills, fever, night sweats or weight changes.  Cardiovascular: negative for chest pain, dyspnea on exertion, edema,  orthopnea, palpitations, paroxysmal nocturnal dyspnea or shortness of breath Dermatological: negative for rash Respiratory: negative for cough or wheezing Urologic: negative for hematuria Abdominal: negative for nausea, vomiting, diarrhea, bright red blood per rectum, melena, or hematemesis Neurologic: negative for visual changes, syncope, or dizziness All other systems reviewed and are otherwise negative except as noted above.    Blood pressure 122/60, pulse (!) 55, height 6\' 2"  (1.88 m), weight 195 lb 3.2 oz (88.5 kg).  General appearance: alert and no distress Neck: no adenopathy, no JVD, supple, symmetrical, trachea midline, thyroid not enlarged, symmetric, no tenderness/mass/nodules, and bilateral carotid bruits Lungs: clear to auscultation bilaterally Heart: regular rate and rhythm, S1, S2 normal, no murmur, click, rub or gallop Extremities: extremities normal, atraumatic, no cyanosis or edema Pulses: 2+ and symmetric Skin: Skin color, texture, turgor normal. No rashes or lesions Neurologic: Grossly normal  EKG sinus bradycardia at 55 with evidence of LVH with repolarization changes.  Personally reviewed this EKG.  ASSESSMENT AND PLAN:   Hypertension History of essential hypertension blood pressure measured today at 122/60.  He is on carvedilol and hydralazine.  Hyperlipidemia History of hyperlipidemia on statin therapy with lipid profile performed 05/12/2020 revealing total cholesterol 130, LDL 68 and HDL 47.  History of non-ST elevation myocardial infarction (NSTEMI) History of non-STEMI December 2019 at the time of a GI bleed.  Troponin sickled up it was elected not to perform cardiac catheterization because of his kidney function and GI bleeding.  2D echo revealed an EF of 45 to 50% with inferior/inferolateral and apical inferior wall motion abnormality and a Myoview stress test showed inferior basal scar without ischemia performed 11/22/2018.  He currently denies chest pain or  shortness of breath.  PAF (paroxysmal atrial fibrillation) (HCC) History of prior stroke with Linq recorder demonstrating PAF however his oral anticoagulation was discontinued because of GI bleeding.  Carotid artery disease (HCC) History of carotid artery disease with carotid Dopplers performed 01/27/2011 revealing moderate bilateral ICA stenosis right greater than left.  We will continue to follow him noninvasively on an annual basis.     Lorretta Harp MD FACP,FACC,FAHA, [facility] 02/09/2021 10:22 AM

## 2021-02-09 NOTE — Assessment & Plan Note (Signed)
History of carotid artery disease with carotid Dopplers performed 01/27/2011 revealing moderate bilateral ICA stenosis right greater than left.  We will continue to follow him noninvasively on an annual basis.

## 2021-02-09 NOTE — Patient Instructions (Signed)
Medication Instructions:  Your physician recommends that you continue on your current medications as directed. Please refer to the Current Medication list given to you today.  *If you need a refill on your cardiac medications before your next appointment, please call your pharmacy*   Testing/Procedures: Your physician has requested that you have a carotid duplex. This test is an ultrasound of the carotid arteries in your neck. It looks at blood flow through these arteries that supply the brain with blood. Allow one hour for this exam. There are no restrictions or special instructions. To be done in October 2023. This procedure is done at [facility].    Follow-Up: At [facility], you and your health needs are our priority.  As part of our continuing mission to provide you with exceptional heart care, we have created designated Provider Care Teams.  These Care Teams include your primary Cardiologist (physician) and Advanced Practice Providers (APPs -  Physician Assistants and Nurse Practitioners) who all work together to provide you with the care you need, when you need it.  We recommend signing up for the patient portal called "MyChart".  Sign up information is provided on this After Visit Summary.  MyChart is used to connect with patients for Virtual Visits (Telemedicine).  Patients are able to view lab/test results, encounter notes, upcoming appointments, etc.  Non-urgent messages can be sent to your provider as well.   To learn more about what you can do with MyChart, go to [email or web address].    Your next appointment:   12 month(s)  The format for your next appointment:   In Person  Provider:   Quay Burow, MD

## 2021-02-09 NOTE — Assessment & Plan Note (Addendum)
History of hyperlipidemia on statin therapy with lipid profile performed 05/12/2020 revealing total cholesterol 130, LDL 68 and HDL 47.

## 2021-02-09 NOTE — Assessment & Plan Note (Signed)
History of essential hypertension blood pressure measured today at 122/60.  He is on carvedilol and hydralazine.

## 2021-02-09 NOTE — Assessment & Plan Note (Signed)
History of non-STEMI December 2019 at the time of a GI bleed.  Troponin sickled up it was elected not to perform cardiac catheterization because of his kidney function and GI bleeding.  2D echo revealed an EF of 45 to 50% with inferior/inferolateral and apical inferior wall motion abnormality and a Myoview stress test showed inferior basal scar without ischemia performed 11/22/2018.  He currently denies chest pain or shortness of breath.

## 2021-02-10 ENCOUNTER — Other Ambulatory Visit: Payer: Self-pay

## 2021-03-03 DIAGNOSIS — I129 Hypertensive chronic kidney disease with stage 1 through stage 4 chronic kidney disease, or unspecified chronic kidney disease: Secondary | ICD-10-CM | POA: Diagnosis not present

## 2021-03-03 DIAGNOSIS — N1832 Chronic kidney disease, stage 3b: Secondary | ICD-10-CM | POA: Diagnosis not present

## 2021-03-03 DIAGNOSIS — I4891 Unspecified atrial fibrillation: Secondary | ICD-10-CM | POA: Diagnosis not present

## 2021-03-03 DIAGNOSIS — D631 Anemia in chronic kidney disease: Secondary | ICD-10-CM | POA: Diagnosis not present

## 2021-03-03 DIAGNOSIS — N2581 Secondary hyperparathyroidism of renal origin: Secondary | ICD-10-CM | POA: Diagnosis not present

## 2021-03-03 DIAGNOSIS — I701 Atherosclerosis of renal artery: Secondary | ICD-10-CM | POA: Diagnosis not present

## 2021-03-14 DIAGNOSIS — R071 Chest pain on breathing: Secondary | ICD-10-CM | POA: Diagnosis not present

## 2021-03-17 ENCOUNTER — Other Ambulatory Visit (HOSPITAL_COMMUNITY): Payer: Self-pay | Admitting: Cardiovascular Disease

## 2021-03-17 DIAGNOSIS — I6523 Occlusion and stenosis of bilateral carotid arteries: Secondary | ICD-10-CM

## 2021-04-15 DIAGNOSIS — E7800 Pure hypercholesterolemia, unspecified: Secondary | ICD-10-CM | POA: Diagnosis not present

## 2021-04-15 DIAGNOSIS — I480 Paroxysmal atrial fibrillation: Secondary | ICD-10-CM | POA: Diagnosis not present

## 2021-04-15 DIAGNOSIS — G4700 Insomnia, unspecified: Secondary | ICD-10-CM | POA: Diagnosis not present

## 2021-04-15 DIAGNOSIS — H35033 Hypertensive retinopathy, bilateral: Secondary | ICD-10-CM | POA: Diagnosis not present

## 2021-04-15 DIAGNOSIS — N183 Chronic kidney disease, stage 3 unspecified: Secondary | ICD-10-CM | POA: Diagnosis not present

## 2021-04-15 DIAGNOSIS — I10 Essential (primary) hypertension: Secondary | ICD-10-CM | POA: Diagnosis not present

## 2021-06-09 ENCOUNTER — Other Ambulatory Visit: Payer: Self-pay | Admitting: Cardiovascular Disease

## 2021-06-24 ENCOUNTER — Ambulatory Visit (INDEPENDENT_AMBULATORY_CARE_PROVIDER_SITE_OTHER): Payer: PPO | Admitting: Psychiatry

## 2021-06-24 ENCOUNTER — Other Ambulatory Visit: Payer: Self-pay

## 2021-06-24 DIAGNOSIS — F4321 Adjustment disorder with depressed mood: Secondary | ICD-10-CM | POA: Diagnosis not present

## 2021-06-24 DIAGNOSIS — R69 Illness, unspecified: Secondary | ICD-10-CM
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
## 2021-08-04 ENCOUNTER — Ambulatory Visit: Payer: PPO | Admitting: Psychiatry

## 2021-10-04 ENCOUNTER — Other Ambulatory Visit: Payer: Self-pay | Admitting: Cardiovascular Disease

## 2021-11-18 DIAGNOSIS — G629 Polyneuropathy, unspecified: Secondary | ICD-10-CM | POA: Diagnosis not present

## 2021-11-18 DIAGNOSIS — R609 Edema, unspecified: Secondary | ICD-10-CM | POA: Diagnosis not present

## 2021-11-18 DIAGNOSIS — I480 Paroxysmal atrial fibrillation: Secondary | ICD-10-CM | POA: Diagnosis not present

## 2021-11-18 DIAGNOSIS — N183 Chronic kidney disease, stage 3 unspecified: Secondary | ICD-10-CM | POA: Diagnosis not present

## 2021-11-18 DIAGNOSIS — E875 Hyperkalemia: Secondary | ICD-10-CM | POA: Diagnosis not present

## 2021-11-18 DIAGNOSIS — I10 Essential (primary) hypertension: Secondary | ICD-10-CM | POA: Diagnosis not present

## 2021-11-18 DIAGNOSIS — I252 Old myocardial infarction: Secondary | ICD-10-CM | POA: Diagnosis not present

## 2021-11-18 DIAGNOSIS — G2581 Restless legs syndrome: Secondary | ICD-10-CM | POA: Diagnosis not present

## 2021-11-18 DIAGNOSIS — Z8673 Personal history of transient ischemic attack (TIA), and cerebral infarction without residual deficits: Secondary | ICD-10-CM | POA: Diagnosis not present

## 2021-11-18 DIAGNOSIS — E7800 Pure hypercholesterolemia, unspecified: Secondary | ICD-10-CM | POA: Diagnosis not present

## 2021-12-17 DIAGNOSIS — I129 Hypertensive chronic kidney disease with stage 1 through stage 4 chronic kidney disease, or unspecified chronic kidney disease: Secondary | ICD-10-CM | POA: Diagnosis not present

## 2021-12-17 DIAGNOSIS — Z86718 Personal history of other venous thrombosis and embolism: Secondary | ICD-10-CM | POA: Diagnosis not present

## 2021-12-17 DIAGNOSIS — I4891 Unspecified atrial fibrillation: Secondary | ICD-10-CM | POA: Diagnosis not present

## 2021-12-17 DIAGNOSIS — N1832 Chronic kidney disease, stage 3b: Secondary | ICD-10-CM | POA: Diagnosis not present

## 2021-12-17 DIAGNOSIS — I5022 Chronic systolic (congestive) heart failure: Secondary | ICD-10-CM | POA: Diagnosis not present

## 2021-12-17 DIAGNOSIS — I701 Atherosclerosis of renal artery: Secondary | ICD-10-CM | POA: Diagnosis not present

## 2022-02-08 ENCOUNTER — Ambulatory Visit (HOSPITAL_COMMUNITY)
Admission: RE | Admit: 2022-02-08 | Discharge: 2022-02-08 | Disposition: A | Discharge status: Home / Self Care | Payer: PPO | Source: Ambulatory Visit | Attending: Cardiovascular Disease | Admitting: Cardiovascular Disease

## 2022-02-08 ENCOUNTER — Other Ambulatory Visit: Payer: Self-pay

## 2022-02-08 ENCOUNTER — Telehealth: Payer: Self-pay

## 2022-02-08 DIAGNOSIS — I6523 Occlusion and stenosis of bilateral carotid arteries: Secondary | ICD-10-CM | POA: Diagnosis not present

## 2022-02-08 NOTE — Telephone Encounter (Signed)
Per PV tech, Alecia came over to speak with Dr. Gwenlyn Found regarding critical results of carotid ultrasound. Carotid stenosis increased to 80-99% on both ICA. Per Dr. Gwenlyn Found order placed for vascular surgery to address. Alecia will notify pt.
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
## 2022-02-11 ENCOUNTER — Ambulatory Visit (INDEPENDENT_AMBULATORY_CARE_PROVIDER_SITE_OTHER): Payer: PPO | Admitting: Vascular Surgery

## 2022-02-11 ENCOUNTER — Encounter: Payer: Self-pay | Admitting: Vascular Surgery

## 2022-02-11 ENCOUNTER — Other Ambulatory Visit: Payer: Self-pay

## 2022-02-11 VITALS — BP 168/77 | HR 61 | Temp 98.5°F | Resp 20 | Ht 74.0 in | Wt 205.0 lb

## 2022-02-11 DIAGNOSIS — I6523 Occlusion and stenosis of bilateral carotid arteries: Secondary | ICD-10-CM

## 2022-02-16 ENCOUNTER — Ambulatory Visit: Payer: PPO | Admitting: Cardiovascular Disease

## 2022-02-16 ENCOUNTER — Ambulatory Visit (HOSPITAL_COMMUNITY)
Admission: RE | Admit: 2022-02-16 | Discharge: 2022-02-16 | Disposition: A | Discharge status: Home / Self Care | Payer: PPO | Source: Ambulatory Visit | Attending: Vascular Surgery | Admitting: Vascular Surgery

## 2022-02-16 DIAGNOSIS — N1831 Chronic kidney disease, stage 3a: Secondary | ICD-10-CM | POA: Diagnosis not present

## 2022-02-16 DIAGNOSIS — M47812 Spondylosis without myelopathy or radiculopathy, cervical region: Secondary | ICD-10-CM | POA: Diagnosis not present

## 2022-02-16 DIAGNOSIS — I6523 Occlusion and stenosis of bilateral carotid arteries: Secondary | ICD-10-CM | POA: Insufficient documentation

## 2022-02-16 DIAGNOSIS — I672 Cerebral atherosclerosis: Secondary | ICD-10-CM | POA: Diagnosis not present

## 2022-02-16 LAB — POCT I-STAT CREATININE: Creatinine, Ser: 2.4 mg/dL — ABNORMAL HIGH (ref 0.61–1.24)

## 2022-02-16 MED ORDER — IOHEXOL 350 MG/ML SOLN
75.0000 mL | Freq: Once | INTRAVENOUS | Status: AC | PRN
Start: 2022-02-16 — End: 2022-02-16
  Administered 2022-02-16: 50 mL via INTRAVENOUS

## 2022-02-18 ENCOUNTER — Ambulatory Visit: Payer: PPO | Attending: Cardiovascular Disease | Admitting: Cardiovascular Disease

## 2022-02-18 ENCOUNTER — Encounter: Payer: Self-pay | Admitting: Cardiovascular Disease

## 2022-02-18 VITALS — BP 126/68 | HR 59 | Ht 74.0 in | Wt 205.2 lb

## 2022-02-18 DIAGNOSIS — Z8673 Personal history of transient ischemic attack (TIA), and cerebral infarction without residual deficits: Secondary | ICD-10-CM

## 2022-02-18 DIAGNOSIS — E782 Mixed hyperlipidemia: Secondary | ICD-10-CM

## 2022-02-18 DIAGNOSIS — I6523 Occlusion and stenosis of bilateral carotid arteries: Secondary | ICD-10-CM

## 2022-02-18 DIAGNOSIS — I252 Old myocardial infarction: Secondary | ICD-10-CM

## 2022-02-18 DIAGNOSIS — I10 Essential (primary) hypertension: Secondary | ICD-10-CM

## 2022-02-18 NOTE — Assessment & Plan Note (Signed)
History of essential hypertension blood pressure measured today 126/68.  He is on carvedilol, and hydralazine.

## 2022-02-18 NOTE — Assessment & Plan Note (Signed)
History of non-STEMI back in December 2019 with positive enzymes.  It was elected not to pursue cardiac catheterization because of renal insufficiency and GI bleeding.  He denies chest pain or shortness of breath.  He did have a Myoview performed 11/22/2018 that showed inferior scar with peri-infarct ischemia.

## 2022-02-18 NOTE — Patient Instructions (Signed)
Medication Instructions:  Your physician recommends that you continue on your current medications as directed. Please refer to the Current Medication list given to you today.  *If you need a refill on your cardiac medications before your next appointment, please call your pharmacy*   Follow-Up: At [facility], you and your health needs are our priority.  As part of our continuing mission to provide you with exceptional heart care, we have created designated Provider Care Teams.  These Care Teams include your primary Cardiologist (physician) and Advanced Practice Providers (APPs -  Physician Assistants and Nurse Practitioners) who all work together to provide you with the care you need, when you need it.  We recommend signing up for the patient portal called "MyChart".  Sign up information is provided on this After Visit Summary.  MyChart is used to connect with patients for Virtual Visits (Telemedicine).  Patients are able to view lab/test results, encounter notes, upcoming appointments, etc.  Non-urgent messages can be sent to your provider as well.   To learn more about what you can do with MyChart, go to [email or web address].    Your next appointment:   12 month(s)  The format for your next appointment:   In Person  Provider:   Jonathan Berry, MD   

## 2022-02-18 NOTE — Assessment & Plan Note (Signed)
History of hyperlipidemia on high-dose atorvastatin with lipid profile performed 11/18/2021 revealing total cholesterol 132, LDL 75 and HDL 42.

## 2022-02-18 NOTE — Assessment & Plan Note (Signed)
History of CVA in the past August 2019.  He did have a TEE that did not show embolic source and a Linq recorder implanted by Dr. Rayann Heman that did show some PAF.  He was started on an oral anticoagulant which had to be stopped because of GI bleeding.

## 2022-02-18 NOTE — Progress Notes (Signed)
02/18/2022 Billy [name]   07-25-1950  [id number]  Primary Physician Antony Contras, MD Primary Cardiologist: Lorretta Harp MD Lupe Carney, [place]  HPI:  Billy [name] is a 71 y.o.  thin appearing divorced Caucasian male father to, grandfather of one grandchild who is retired from [employer] and was referred by Dr. Aundra Dubin to be established in my practice for ongoing cardiovascular care.  I last saw him in my office 02/09/2021.  He lived  with his elderly mother who he took care of but unfortunately she passed away back in 06-30-18.  He currently lives alone..  He has a history of treated hypertension hyperlipidemia.  He is never smoked.  He is never had a heart attack.  There is no family history.  He denies chest pain or shortness of breath.  He has had an echo that shows normal LV function with inferior akinesia.  He did have a TIA/stroke and had a TEE for embolic source which was negative and had a Linq device implanted by Dr. Rayann Heman to rule out PAF as a etiology.  His Linq recorder did show episodes of PAF for which she was begun on Eliquis oral anticoagulation.   He was admitted in late December 2019 for GI bleed and hypotension.  He did undergo endoscopy by Dr. [name] apparently finding a gastric ulcer.  His Eliquis was placed on hold.  He also had a non-STEMI with a peak troponin of 17 although he denies chest pain.  It was elected not to perform cardiac catheterization because of his GI bleed and moderate renal insufficiency with a known occluded left renal artery.  He was also evaluated by Dr. Trula Slade for his carotid disease which were evaluated by duplex ultrasound in August revealing moderate bilateral ICA stenosis thought not to be related to his stroke.   Since I saw him in my office 1 year ago he is remained stable.  He denies chest pain or shortness of breath.  He did have carotid Doppler studies performed in our office 02/09/2022 that showed high-grade bilateral ICA stenosis.   He saw Dr. Amil Amen 02/11/2022 for vascular surgical evaluation.  A CT angiogram was performed and a follow-up appointment has been scheduled.   Current Meds  Medication Sig   atorvastatin (LIPITOR) 80 MG tablet TAKE 1 TABLET BY MOUTH EVERY DAY   carvedilol (COREG) 25 MG tablet Take 25 mg by mouth 2 (two) times daily.    FARXIGA 10 MG TABS tablet    furosemide (LASIX) 40 MG tablet Take 20 mg by mouth as needed.   gabapentin (NEURONTIN) 300 MG capsule Take 300 mg by mouth daily as needed (pain).    hydrALAZINE (APRESOLINE) 100 MG tablet Take 100 mg by mouth 3 (three) times daily.   isosorbide mononitrate (IMDUR) 30 MG 24 hr tablet Take 1 tablet (30 mg total) by mouth daily.   Multiple Vitamin (MULTIVITAMIN WITH MINERALS) TABS tablet Take 1 tablet by mouth daily.   pramipexole (MIRAPEX) 0.125 MG tablet Take 0.125 mg by mouth at bedtime.     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  Tobacco Use   Smoking status: Never   Smokeless tobacco: Never  Vaping Use   Vaping Use: Never used  Substance and Sexual Activity   Alcohol use: No   Drug use: Never   Sexual activity: Not on file  Other Topics Concern   Not on file  Social History Narrative   Raised in [facility]   Caffeine use: coke sometimes   Lives with mother and cares for her currently.   Social Determinants of Health   Financial Resource Strain: Not on file  Food Insecurity: Not on file  Transportation Needs: Not on file  Physical Activity: Not on file   Stress: Not on file  Social Connections: Not on file  Intimate Partner Violence: Not on file     Review of Systems: General: negative for chills, fever, night sweats or weight changes.  Cardiovascular: negative for chest pain, dyspnea on exertion, edema, orthopnea, palpitations, paroxysmal nocturnal dyspnea or shortness of breath Dermatological: negative for rash Respiratory: negative for cough or wheezing Urologic: negative for hematuria Abdominal: negative for nausea, vomiting, diarrhea, bright red blood per rectum, melena, or hematemesis Neurologic: negative for visual changes, syncope, or dizziness All other systems reviewed and are otherwise negative except as noted above.    Blood pressure 126/68, pulse (!) 59, height '6\' 2"'$  (1.88 m), weight 205 lb 3.2 oz (93.1 kg), SpO2 98 %.  General appearance: alert and no distress Neck: no adenopathy, no JVD, supple, symmetrical, trachea midline, thyroid not enlarged, symmetric, no tenderness/mass/nodules, and bilateral carotid bruits Lungs: clear to auscultation bilaterally Heart: regular rate and rhythm, S1, S2 normal, no murmur, click, rub or gallop Extremities: extremities normal, atraumatic, no cyanosis or edema Pulses: 2+ and symmetric Skin: Skin color, texture, turgor normal. No rashes or lesions Neurologic: Grossly normal  EKG sinus bradycardia 59 with LVH voltage with repolarization changes and septal Q waves.  I personally reviewed this EKG.  ASSESSMENT AND PLAN:   Hypertension History of essential hypertension blood pressure measured today 126/68.  He is on carvedilol, and hydralazine.  History of CVA (cerebrovascular accident) History of CVA in the past August 2019.  He did have a TEE that did not show embolic source and a Linq recorder implanted by Dr. Rayann Heman that did show some PAF.  He was started on an oral anticoagulant which had to be stopped because of GI bleeding.  Hyperlipidemia History of hyperlipidemia on  high-dose atorvastatin with lipid profile performed 11/18/2021 revealing total cholesterol 132, LDL 75 and HDL 42.  History of non-ST elevation myocardial infarction (NSTEMI) History of non-STEMI back in December 2019 with positive enzymes.  It was elected not to pursue cardiac catheterization because of renal insufficiency and GI bleeding.  He denies chest pain or shortness of breath.  He did have a Myoview performed 11/22/2018 that showed inferior scar with peri-infarct ischemia.  Carotid artery disease ([place]) History of severe bilateral ICA stenosis by recent duplex ultrasound in our office.  I did refer him to Dr. Unk Lightning, vascular surgery, who ordered a CTA of his neck with anticipation of either surgical or TCAR revascularization.     Lorretta Harp MD FACP,FACC,FAHA, [facility] 02/18/2022 10:39 AM

## 2022-02-18 NOTE — Assessment & Plan Note (Signed)
History of severe bilateral ICA stenosis by recent duplex ultrasound in our office.  I did refer him to Dr. Unk Lightning, vascular surgery, who ordered a CTA of his neck with anticipation of either surgical or TCAR revascularization.

## 2022-02-20 ENCOUNTER — Other Ambulatory Visit: Payer: Self-pay | Admitting: Cardiovascular Disease
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
## 2022-02-25 ENCOUNTER — Ambulatory Visit: Payer: PPO | Admitting: Vascular Surgery

## 2022-03-08 ENCOUNTER — Telehealth: Payer: Self-pay | Admitting: Vascular Surgery

## 2022-03-08 NOTE — Telephone Encounter (Signed)
-----   Message from Billy John, MD sent at 03/03/2022  7:43 AM EST ----- We need to get this guy back in the office for carotid procedure. He missed his appt last week.

## 2022-05-13 NOTE — Telephone Encounter (Signed)
Appt Schedule by Venetia Maxon
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
## 2022-05-20 ENCOUNTER — Encounter: Payer: Self-pay | Admitting: Vascular Surgery

## 2022-05-20 ENCOUNTER — Ambulatory Visit (INDEPENDENT_AMBULATORY_CARE_PROVIDER_SITE_OTHER): Payer: PPO | Admitting: Vascular Surgery

## 2022-05-20 VITALS — BP 161/76 | HR 68 | Temp 98.4°F | Resp 20 | Ht 74.0 in | Wt 206.0 lb

## 2022-05-20 DIAGNOSIS — I6523 Occlusion and stenosis of bilateral carotid arteries: Secondary | ICD-10-CM | POA: Diagnosis not present

## 2022-05-20 MED ORDER — CLOPIDOGREL BISULFATE 75 MG PO TABS: 75.0000 mg | ORAL_TABLET | Freq: Every day | ORAL | 6 refills | Status: DC

## 2022-05-24 ENCOUNTER — Ambulatory Visit (HOSPITAL_COMMUNITY)
Admission: RE | Admit: 2022-05-24 | Discharge: 2022-05-24 | Disposition: A | Discharge status: Home / Self Care | Payer: PPO | Source: Ambulatory Visit | Attending: Vascular Surgery | Admitting: Vascular Surgery

## 2022-05-24 ENCOUNTER — Other Ambulatory Visit: Payer: Self-pay | Admitting: *Deleted

## 2022-05-24 DIAGNOSIS — I6523 Occlusion and stenosis of bilateral carotid arteries: Secondary | ICD-10-CM | POA: Diagnosis not present

## 2022-05-31 ENCOUNTER — Encounter: Payer: Self-pay | Admitting: Cardiovascular Disease

## 2022-05-31 ENCOUNTER — Ambulatory Visit: Payer: PPO | Attending: Cardiovascular Disease | Admitting: Cardiovascular Disease

## 2022-05-31 VITALS — BP 122/62 | HR 61 | Ht 74.0 in | Wt 206.0 lb

## 2022-05-31 DIAGNOSIS — E782 Mixed hyperlipidemia: Secondary | ICD-10-CM

## 2022-05-31 DIAGNOSIS — I6523 Occlusion and stenosis of bilateral carotid arteries: Secondary | ICD-10-CM | POA: Diagnosis not present

## 2022-05-31 DIAGNOSIS — I10 Essential (primary) hypertension: Secondary | ICD-10-CM

## 2022-05-31 DIAGNOSIS — I252 Old myocardial infarction: Secondary | ICD-10-CM | POA: Diagnosis not present

## 2022-05-31 NOTE — Assessment & Plan Note (Signed)
History of hyperlipidemia on high-dose statin therapy with lipid profile performed 11/18/2021 revealing total cholesterol 132, LDL 75 and HDL 42.

## 2022-05-31 NOTE — Assessment & Plan Note (Signed)
History of non-STEMI in the past in the setting of GI bleed.  Cardiac cath was not pursued because of renal insufficiency.  He denies chest pain.

## 2022-05-31 NOTE — Patient Instructions (Signed)
Medication Instructions:  Your physician recommends that you continue on your current medications as directed. Please refer to the Current Medication list given to you today.  *If you need a refill on your cardiac medications before your next appointment, please call your pharmacy*   Testing/Procedures: Your physician has requested that you have a lexiscan myoview. For further information please visit [email or web address]. Please follow instruction sheet, as given. This will take place at [address]  How to prepare for your Myocardial Perfusion Test: Do not eat or drink 3 hours prior to your test, except you may have water. Do not consume products containing caffeine (regular or decaffeinated) 12 hours prior to your test. (ex: coffee, chocolate, sodas, tea). Do bring a list of your current medications with you.  If not listed below, you may take your medications as normal. Do wear comfortable clothes (no dresses or overalls) and walking shoes, tennis shoes preferred (No heels or open toe shoes are allowed). Do NOT wear cologne, perfume, aftershave, or lotions (deodorant is allowed). The test will take approximately 3 to 4 hours to complete If these instructions are not followed, your test will have to be rescheduled.   Your physician has requested that you have an echocardiogram. Echocardiography is a painless test that uses sound waves to create images of your heart. It provides your doctor with information about the size and shape of your heart and how well your heart's chambers and valves are working. This procedure takes approximately one hour. There are no restrictions for this procedure. Please do NOT wear cologne, perfume, aftershave, or lotions (deodorant is allowed). Please arrive 15 minutes prior to your appointment time. This procedure will be done at [address]    Follow-Up: At [facility], you and your health needs are our priority.  As  part of our continuing mission to provide you with exceptional heart care, we have created designated Provider Care Teams.  These Care Teams include your primary Cardiologist (physician) and Advanced Practice Providers (APPs -  Physician Assistants and Nurse Practitioners) who all work together to provide you with the care you need, when you need it.  We recommend signing up for the patient portal called "MyChart".  Sign up information is provided on this After Visit Summary.  MyChart is used to connect with patients for Virtual Visits (Telemedicine).  Patients are able to view lab/test results, encounter notes, upcoming appointments, etc.  Non-urgent messages can be sent to your provider as well.   To learn more about what you can do with MyChart, go to [email or web address].    Your next appointment:   12 month(s)  Provider:   Quay Burow, MD

## 2022-05-31 NOTE — Assessment & Plan Note (Signed)
History of high-grade bilateral ICA stenosis.  He did see Dr. Virl Cagey with suggested that he undergo bilateral carotid endarterectomy.  We will perform 2D echocardiography and Lexiscan Myoview stress testing for preoperative clearance.

## 2022-05-31 NOTE — Progress Notes (Signed)
05/31/2022 Billy [name]   06-09-1950  [id number]  Primary Physician Antony Contras, MD Primary Cardiologist: Lorretta Harp MD Lupe Carney, [place]  HPI:  Billy [name] is a 72 y.o.  thin appearing divorced Caucasian male father to, grandfather of one grandchild who is retired from [employer] and was referred by Dr. Aundra Dubin to be established in my practice for ongoing cardiovascular care.  I last saw him in my office 02/18/2022.  He lived  with his elderly mother who he took care of but unfortunately she passed away back in 02-Jul-2018.  He currently lives alone..  He has a history of treated hypertension hyperlipidemia.  He is never smoked.  He is never had a heart attack.  There is no family history.  He denies chest pain or shortness of breath.  He has had an echo that shows normal LV function with inferior akinesia.  He did have a TIA/stroke and had a TEE for embolic source which was negative and had a Linq device implanted by Dr. Rayann Heman to rule out PAF as a etiology.  His Linq recorder did show episodes of PAF for which she was begun on Eliquis oral anticoagulation.   He was admitted in late December 2019 for GI bleed and hypotension.  He did undergo endoscopy by Dr. [name] apparently finding a gastric ulcer.  His Eliquis was placed on hold.  He also had a non-STEMI with a peak troponin of 17 although he denies chest pain.  It was elected not to perform cardiac catheterization because of his GI bleed and moderate renal insufficiency with a known occluded left renal artery.  He was also evaluated by Dr. Trula Slade for his carotid disease which were evaluated by duplex ultrasound in August revealing moderate bilateral ICA stenosis thought not to be related to his stroke.   He saw Dr. Amil Amen 02/11/2022 for vascular surgical evaluation.  A CT angiogram was performed .  He saw Dr. Virl Cagey again 05/20/2022 to review his CTA that showed high-grade bilateral ICA stenosis requiring bilateral carotid  endarterectomies as opposed to TCAR because of his moderate renal insufficiency.  He has remained stable since I saw him back in November denying chest pain or shortness of breath.   Current Meds  Medication Sig   atorvastatin (LIPITOR) 80 MG tablet TAKE 1 TABLET BY MOUTH EVERY DAY   carvedilol (COREG) 25 MG tablet Take 25 mg by mouth 2 (two) times daily.    clopidogrel (PLAVIX) 75 MG tablet Take 1 tablet (75 mg total) by mouth daily.   FARXIGA 10 MG TABS tablet    furosemide (LASIX) 40 MG tablet Take 20 mg by mouth as needed.   gabapentin (NEURONTIN) 300 MG capsule Take 300 mg by mouth daily as needed (pain).    hydrALAZINE (APRESOLINE) 100 MG tablet Take 100 mg by mouth 3 (three) times daily.   isosorbide mononitrate (IMDUR) 30 MG 24 hr tablet TAKE 1 TABLET BY MOUTH EVERY DAY   Multiple Vitamin (MULTIVITAMIN WITH MINERALS) TABS tablet Take 1 tablet by mouth daily.   pramipexole (MIRAPEX) 0.125 MG tablet Take 0.125 mg by mouth at bedtime.     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  Tobacco Use   Smoking status: Never   Smokeless tobacco: Never  Vaping Use   Vaping Use: Never used  Substance and Sexual Activity   Alcohol use: No   Drug use: Never   Sexual activity: Not on file  Other Topics Concern   Not on file  Social History Narrative   Raised in [employer] [facility] [place]   Caffeine use: coke sometimes   Lives with mother and cares for her currently.   Social Determinants of Health   Financial Resource Strain: Not on file   Food Insecurity: Not on file  Transportation Needs: Not on file  Physical Activity: Not on file  Stress: Not on file  Social Connections: Not on file  Intimate Partner Violence: Not on file     Review of Systems: General: negative for chills, fever, night sweats or weight changes.  Cardiovascular: negative for chest pain, dyspnea on exertion, edema, orthopnea, palpitations, paroxysmal nocturnal dyspnea or shortness of breath Dermatological: negative for rash Respiratory: negative for cough or wheezing Urologic: negative for hematuria Abdominal: negative for nausea, vomiting, diarrhea, bright red blood per rectum, melena, or hematemesis Neurologic: negative for visual changes, syncope, or dizziness All other systems reviewed and are otherwise negative except as noted above.    Blood pressure 122/62, pulse 61, height 6' 2"$  (1.88 m), weight 206 lb (93.4 kg).  General appearance: alert and no distress Neck: no adenopathy, no carotid bruit, no JVD, supple, symmetrical, trachea midline, and thyroid not enlarged, symmetric, no tenderness/mass/nodules Lungs: clear to auscultation bilaterally Heart: regular rate and rhythm, S1, S2 normal, no murmur, click, rub or gallop Extremities: extremities normal, atraumatic, no cyanosis or edema Pulses: 2+ and symmetric Skin: Skin color, texture, turgor normal. No rashes or lesions Neurologic: Grossly normal  EKG sinus rhythm at 61 with LVH voltage with repolarization changes.  I personally reviewed this EKG.  ASSESSMENT AND PLAN:   Hypertension History of essential hypertension blood pressure measured today at 122/62.  He is on carvedilol, hydralazine.  Hyperlipidemia History of hyperlipidemia on high-dose statin therapy with lipid profile performed 11/18/2021 revealing total cholesterol 132, LDL 75 and HDL 42.  History of non-ST elevation myocardial infarction (NSTEMI) History of non-STEMI in the past in the setting of GI bleed.  Cardiac cath  was not pursued because of renal insufficiency.  He denies chest pain.  Carotid artery disease (HCC) History of high-grade bilateral ICA stenosis.  He did see Dr. Virl Cagey with suggested that he undergo bilateral carotid endarterectomy.  We will perform 2D echocardiography and Lexiscan Myoview stress testing for preoperative clearance.     Lorretta Harp MD FACP,FACC,FAHA, [facility] 05/31/2022 2:54 PM

## 2022-05-31 NOTE — Assessment & Plan Note (Signed)
History of essential hypertension blood pressure measured today at 122/62.  He is on carvedilol, hydralazine.

## 2022-06-20 DIAGNOSIS — N1832 Chronic kidney disease, stage 3b: Secondary | ICD-10-CM | POA: Diagnosis not present

## 2022-06-20 DIAGNOSIS — Z86718 Personal history of other venous thrombosis and embolism: Secondary | ICD-10-CM | POA: Diagnosis not present

## 2022-06-20 DIAGNOSIS — I4891 Unspecified atrial fibrillation: Secondary | ICD-10-CM | POA: Diagnosis not present

## 2022-06-20 DIAGNOSIS — I129 Hypertensive chronic kidney disease with stage 1 through stage 4 chronic kidney disease, or unspecified chronic kidney disease: Secondary | ICD-10-CM | POA: Diagnosis not present

## 2022-06-20 DIAGNOSIS — I5022 Chronic systolic (congestive) heart failure: Secondary | ICD-10-CM | POA: Diagnosis not present

## 2022-06-20 DIAGNOSIS — N184 Chronic kidney disease, stage 4 (severe): Secondary | ICD-10-CM | POA: Diagnosis not present

## 2022-06-20 DIAGNOSIS — I701 Atherosclerosis of renal artery: Secondary | ICD-10-CM | POA: Diagnosis not present

## 2022-06-22 ENCOUNTER — Telehealth (HOSPITAL_COMMUNITY): Payer: Self-pay | Admitting: *Deleted

## 2022-06-22 NOTE — Telephone Encounter (Signed)
Left message on voicemail per DPR in reference to upcoming appointment scheduled on 06/29/22 with detailed instructions given per Myocardial Perfusion Study Information Sheet for the test. LM to arrive 15 minutes early, and that it is imperative to arrive on time for appointment to keep from having the test rescheduled. If you need to cancel or reschedule your appointment, please call the office within 24 hours of your appointment. Failure to do so may result in a cancellation of your appointment, and a $50 no show fee. Phone number given for call back for any questions. Kirstie Peri

## 2022-06-29 ENCOUNTER — Ambulatory Visit (HOSPITAL_BASED_OUTPATIENT_CLINIC_OR_DEPARTMENT_OTHER): Payer: PPO

## 2022-06-29 ENCOUNTER — Ambulatory Visit (HOSPITAL_COMMUNITY): Payer: PPO | Attending: Cardiovascular Disease

## 2022-06-29 DIAGNOSIS — I6523 Occlusion and stenosis of bilateral carotid arteries: Secondary | ICD-10-CM | POA: Diagnosis not present

## 2022-06-29 DIAGNOSIS — I252 Old myocardial infarction: Secondary | ICD-10-CM

## 2022-06-29 DIAGNOSIS — I10 Essential (primary) hypertension: Secondary | ICD-10-CM | POA: Insufficient documentation

## 2022-06-29 DIAGNOSIS — E782 Mixed hyperlipidemia: Secondary | ICD-10-CM | POA: Diagnosis not present

## 2022-06-29 LAB — MYOCARDIAL PERFUSION IMAGING
LV dias vol: 134 mL (ref 62–150)
LV sys vol: 65 mL
Nuc Stress EF: 51 %
Peak HR: 76 {beats}/min
Rest HR: 57 {beats}/min
Rest Nuclear Isotope Dose: 10.6 mCi
SDS: 5
SRS: 3
SSS: 8
ST Depression (mm): 0 mm
Stress Nuclear Isotope Dose: 32.6 mCi
TID: 1.17

## 2022-06-29 LAB — ECHOCARDIOGRAM COMPLETE
Area-P 1/2: 3.91 cm2
Height: 74 in
S' Lateral: 3 cm
Weight: 3296 oz

## 2022-06-29 MED ORDER — TECHNETIUM TC 99M TETROFOSMIN IV KIT
10.6000 | PACK | Freq: Once | INTRAVENOUS | Status: AC | PRN
  Administered 2022-06-29: 10.6 via INTRAVENOUS

## 2022-06-29 MED ORDER — REGADENOSON 0.4 MG/5ML IV SOLN
0.4000 mg | Freq: Once | INTRAVENOUS | Status: AC
  Administered 2022-06-29: 0.4 mg via INTRAVENOUS

## 2022-06-29 MED ORDER — TECHNETIUM TC 99M TETROFOSMIN IV KIT
32.6000 | PACK | Freq: Once | INTRAVENOUS | Status: AC | PRN
  Administered 2022-06-29: 32.6 via INTRAVENOUS

## 2022-07-05 ENCOUNTER — Telehealth: Payer: Self-pay

## 2022-07-05 NOTE — Telephone Encounter (Signed)
Attempted to reach patient to schedule Lt CEA, but no answer. Left vm message for patient to return call.

## 2022-07-06 ENCOUNTER — Other Ambulatory Visit: Payer: Self-pay

## 2022-07-06 ENCOUNTER — Telehealth: Payer: Self-pay

## 2022-07-06 DIAGNOSIS — I6523 Occlusion and stenosis of bilateral carotid arteries: Secondary | ICD-10-CM

## 2022-07-06 NOTE — Telephone Encounter (Signed)
     Attempted to reach patient to schedule Lt CEA, but no answer. Left vm message for patient to return call.

## 2022-07-21 NOTE — Pre-Procedure Instructions (Signed)
Surgical Instructions    Your procedure is scheduled on Thursday, July 28, 2022 at 7:30 AM.  Report to [facility] Main Entrance "A" at 5:30 A.M., then check in with the Admitting office.  Call this number if you have problems the morning of surgery:  [phone]   If you have any questions prior to your surgery date call [phone]: Open Monday-Friday 8am-4pm  *If you experience any cold or flu symptoms such as cough, fever, chills, shortness of breath, etc. between now and your scheduled surgery, please notify us.*    Remember:  Do not eat or drink after midnight the night before your surgery    Take these medicines the morning of surgery with A SIP OF WATER:  atorvastatin (LIPITOR)  carvedilol (COREG)  clopidogrel (PLAVIX)  hydrALAZINE (APRESOLINE)  isosorbide mononitrate (IMDUR)   As of today, STOP taking any Aleve, Naproxen, Ibuprofen, Motrin, Advil, Goody's, BC's, all herbal medications, fish oil, and all vitamins.                     Do NOT Smoke (Tobacco/Vaping) for 24 hours prior to your procedure.  If you use a CPAP at night, you may bring your mask/headgear for your overnight stay.   Contacts, glasses, piercing's, hearing aid's, dentures or partials may not be worn into surgery, please bring cases for these belongings.    For patients admitted to the hospital, discharge time will be determined by your treatment team.   Patients discharged the day of surgery will not be allowed to drive home, and someone needs to stay with them for 24 hours.  SURGICAL WAITING ROOM VISITATION Patients having surgery or a procedure may have 2 support people in the waiting area. Visitors may stay in the waiting area during the procedure and switch out with other visitors if needed. Only 1 support person is allowed in the pre-op area with the patient AFTER the patient is prepped. This person cannot be switched out.  Children under the age of 90 must have an adult accompany them who  is not the patient. If the patient needs to stay at the hospital during part of their recovery, the visitor guidelines for inpatient rooms apply.  Please refer to the [facility] website for the visitor guidelines for Inpatients (after your surgery is over and you are in a regular room).    Special instructions:   [place]- Preparing For Surgery  Before surgery, you can play an important role. Because skin is not sterile, your skin needs to be as free of germs as possible. You can reduce the number of germs on your skin by washing with CHG (chlorahexidine gluconate) Soap before surgery.  CHG is an antiseptic cleaner which kills germs and bonds with the skin to continue killing germs even after washing.    Oral Hygiene is also important to reduce your risk of infection.  Remember - BRUSH YOUR TEETH THE MORNING OF SURGERY WITH YOUR REGULAR TOOTHPASTE  Please do not use if you have an allergy to CHG or antibacterial soaps. If your skin becomes reddened/irritated stop using the CHG.  Do not shave (including legs and underarms) for at least 48 hours prior to first CHG shower. It is OK to shave your face.  Please follow these instructions carefully.   Shower the NIGHT BEFORE SURGERY and the MORNING OF SURGERY  If you chose to wash your hair, wash your hair first as usual with your normal shampoo.  After you shampoo, rinse  your hair and body thoroughly to remove the shampoo.  Use CHG Soap as you would any other liquid soap. You can apply CHG directly to the skin and wash gently with a scrungie or a clean washcloth.   Apply the CHG Soap to your body ONLY FROM THE NECK DOWN (neck, arms, chest, abdomen, legs, and back).  Do not use on open wounds or open sores. Avoid contact with your eyes, ears, mouth and genitals (private parts). Wash Face and genitals (private parts)  with your normal soap.   Wash thoroughly, paying special attention to the area where your surgery will be  performed.  Thoroughly rinse your body with warm water from the neck down.  DO NOT shower/wash with your normal soap after using and rinsing off the CHG Soap.  Pat yourself dry with a CLEAN TOWEL.  Wear CLEAN PAJAMAS to bed the night before surgery  Place CLEAN SHEETS on your bed the night before your surgery  DO NOT SLEEP WITH PETS.   Day of Surgery: Take a shower with CHG soap. Do not wear lotions, powders, perfumes/colognes, or deodorant. Do not wear jewelry. Do not shave 48 hours prior to surgery.  Men may shave face and neck. Wear Clean/Comfortable clothing the morning of surgery Do not bring valuables to the hospital.  [facility] is not responsible for any belongings or valuables. Remember to brush your teeth WITH YOUR REGULAR TOOTHPASTE.   Please read over the following fact sheets that you were given.  If you received a COVID test during your pre-op visit  it is requested that you wear a mask when out in public, stay away from anyone that may not be feeling well and notify your surgeon if you develop symptoms. If you have been in contact with anyone that has tested positive in the last 10 days please notify you surgeon.

## 2022-07-22 ENCOUNTER — Encounter (HOSPITAL_COMMUNITY): Payer: Self-pay

## 2022-07-22 ENCOUNTER — Other Ambulatory Visit: Payer: Self-pay

## 2022-07-22 ENCOUNTER — Encounter (HOSPITAL_COMMUNITY)
Admission: RE | Admit: 2022-07-22 | Discharge: 2022-07-22 | Disposition: A | Discharge status: Home / Self Care | Payer: PPO | Source: Ambulatory Visit | Attending: Vascular Surgery | Admitting: Vascular Surgery

## 2022-07-22 VITALS — BP 167/79 | HR 63 | Temp 97.7°F | Resp 18 | Ht 74.0 in | Wt 204.5 lb

## 2022-07-22 DIAGNOSIS — Z01812 Encounter for preprocedural laboratory examination: Secondary | ICD-10-CM | POA: Diagnosis not present

## 2022-07-22 DIAGNOSIS — Z01818 Encounter for other preprocedural examination: Secondary | ICD-10-CM

## 2022-07-22 DIAGNOSIS — I129 Hypertensive chronic kidney disease with stage 1 through stage 4 chronic kidney disease, or unspecified chronic kidney disease: Secondary | ICD-10-CM | POA: Insufficient documentation

## 2022-07-22 DIAGNOSIS — Z8673 Personal history of transient ischemic attack (TIA), and cerebral infarction without residual deficits: Secondary | ICD-10-CM | POA: Diagnosis not present

## 2022-07-22 DIAGNOSIS — I4891 Unspecified atrial fibrillation: Secondary | ICD-10-CM | POA: Insufficient documentation

## 2022-07-22 DIAGNOSIS — Z86718 Personal history of other venous thrombosis and embolism: Secondary | ICD-10-CM | POA: Diagnosis not present

## 2022-07-22 DIAGNOSIS — I700 Atherosclerosis of aorta: Secondary | ICD-10-CM | POA: Insufficient documentation

## 2022-07-22 DIAGNOSIS — I252 Old myocardial infarction: Secondary | ICD-10-CM | POA: Diagnosis not present

## 2022-07-22 DIAGNOSIS — I6523 Occlusion and stenosis of bilateral carotid arteries: Secondary | ICD-10-CM | POA: Diagnosis not present

## 2022-07-22 DIAGNOSIS — N184 Chronic kidney disease, stage 4 (severe): Secondary | ICD-10-CM | POA: Diagnosis not present

## 2022-07-22 LAB — URINALYSIS, ROUTINE W REFLEX MICROSCOPIC
Bilirubin Urine: NEGATIVE
Glucose, UA: NEGATIVE mg/dL
Hgb urine dipstick: NEGATIVE
Ketones, ur: NEGATIVE mg/dL
Leukocytes,Ua: NEGATIVE
Nitrite: NEGATIVE
Protein, ur: NEGATIVE mg/dL
Specific Gravity, Urine: 1.02 (ref 1.005–1.030)
pH: 5 (ref 5.0–8.0)

## 2022-07-22 LAB — COMPREHENSIVE METABOLIC PANEL
ALT: 18 U/L (ref 0–44)
AST: 23 U/L (ref 15–41)
Albumin: 3.6 g/dL (ref 3.5–5.0)
Alkaline Phosphatase: 56 U/L (ref 38–126)
Anion gap: 9 (ref 5–15)
BUN: 33 mg/dL — ABNORMAL HIGH (ref 8–23)
CO2: 22 mmol/L (ref 22–32)
Calcium: 9.1 mg/dL (ref 8.9–10.3)
Chloride: 109 mmol/L (ref 98–111)
Creatinine, Ser: 2.12 mg/dL — ABNORMAL HIGH (ref 0.61–1.24)
GFR, Estimated: 33 mL/min — ABNORMAL LOW (ref >60)
Glucose, Bld: 88 mg/dL (ref 70–99)
Potassium: 4.3 mmol/L (ref 3.5–5.1)
Sodium: 140 mmol/L (ref 135–145)
Total Bilirubin: 0.6 mg/dL (ref 0.3–1.2)
Total Protein: 6.7 g/dL (ref 6.5–8.1)

## 2022-07-22 LAB — TYPE AND SCREEN
ABO/RH(D): A POS
Antibody Screen: NEGATIVE

## 2022-07-22 LAB — CBC
HCT: 45 % (ref 39.0–52.0)
Hemoglobin: 14.8 g/dL (ref 13.0–17.0)
MCH: 30 pg (ref 26.0–34.0)
MCHC: 32.9 g/dL (ref 30.0–36.0)
MCV: 91.3 fL (ref 80.0–100.0)
Platelets: 213 10*3/uL (ref 150–400)
RBC: 4.93 MIL/uL (ref 4.22–5.81)
RDW: 13.4 % (ref 11.5–15.5)
WBC: 6.3 10*3/uL (ref 4.0–10.5)
nRBC: 0 % (ref 0.0–0.2)

## 2022-07-22 LAB — PROTIME-INR
INR: 1.2 (ref 0.8–1.2)
Prothrombin Time: 14.6 seconds (ref 11.4–15.2)

## 2022-07-22 LAB — SURGICAL PCR SCREEN
MRSA, PCR: NEGATIVE
Staphylococcus aureus: NEGATIVE

## 2022-07-22 LAB — APTT: aPTT: 33 seconds (ref 24–36)

## 2022-07-22 NOTE — Progress Notes (Signed)
PCP - Dr. [name] Cardiologist - Dr. Nanetta Batty Nephrologist: Dr. Bufford Buttner  PPM/ICD - Inactive loop recorder Device Orders -  Rep Notified -   Chest x-ray - N/A EKG - 05/31/22 Stress Test - 06/29/22 ECHO - 06/29/22 Cardiac Cath - Denies  Sleep Study - Denies  Diabetes: Denies  Blood Thinner Instructions: Continue Plavix Aspirin Instructions: N/A  ERAS Protcol - No  COVID TEST- N/A   Anesthesia review: Yes, cardiac hx  Patient denies shortness of breath, fever, cough and chest pain at PAT appointment   All instructions explained to the patient, with a verbal understanding of the material. Patient agrees to go over the instructions while at home for a better understanding. Patient also instructed to self quarantine after being tested for COVID-19. The opportunity to ask questions was provided.
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
## 2022-07-25 NOTE — Anesthesia Preprocedure Evaluation (Signed)
Anesthesia Evaluation  Patient identified by MRN, date of birth, ID band Patient awake    Reviewed: Allergy & Precautions, NPO status , Patient's Chart, lab work & pertinent test results, reviewed documented beta blocker date and time   History of Anesthesia Complications (+) PONV and history of anesthetic complications  Airway Mallampati: II  TM Distance: >3 FB Neck ROM: Limited    Dental  (+) Teeth Intact, Caps, Dental Advisory Given   Pulmonary    Pulmonary exam normal breath sounds clear to auscultation       Cardiovascular hypertension, Pt. on medications and Pt. on home beta blockers + Past MI and + Peripheral Vascular Disease  Normal cardiovascular exam Rhythm:Regular Rate:Normal  EKG 05/31/22 NSR, Non specific ST-T wave abnormalities  Nuclear stress test 06/29/22:   Findings are consistent with infarction with peri-infarct ischemia. The study is low risk.   No ST deviation was noted.   Left ventricular function is abnormal. Global function is mildly reduced. End diastolic cavity size is normal.   Prior study available for comparison from 11/22/2018.  Low risk stress nuclear study with mostly fixed defect of old basal inferior-inferolateral infarction with minimal peri-infarct ischemia and mildly reduced left ventricular global systolic function.   Echo 06/29/22: IMPRESSIONS  1. Left ventricular ejection fraction, by estimation, is 65 to 70%. The  left ventricle has normal function. The left ventricle has no regional  wall motion abnormalities. Left ventricular diastolic parameters were  normal.  2. Right ventricular systolic function is normal. The right ventricular  size is normal. There is normal pulmonary artery systolic pressure.  3. Left atrial size was mildly dilated.  4. Right atrial size was mildly dilated.  5. The mitral valve is normal in structure. Trivial mitral valve  regurgitation. No evidence  of mitral stenosis.  6. The aortic valve is tricuspid. There is mild calcification of the  aortic valve. There is mild thickening of the aortic valve. Aortic valve  regurgitation is not visualized. No aortic stenosis is present.  7. The inferior vena cava is normal in size with greater than 50%  respiratory variability, suggesting right atrial pressure of 3 mmHg.   Bilateral Carotid stenosis      Neuro/Psych Peripheral neuropathy TIA Neuromuscular disease CVA, No Residual Symptoms  negative psych ROS   GI/Hepatic negative GI ROS, Neg liver ROS,,,  Endo/Other  Hyperlipidemia  Renal/GU Renal InsufficiencyRenal disease  negative genitourinary   Musculoskeletal negative musculoskeletal ROS (+)    Abdominal   Peds  Hematology  (+) Blood dyscrasia, anemia Plavix therapy - last dose this am   Anesthesia Other Findings   Reproductive/Obstetrics                             Anesthesia Physical Anesthesia Plan  ASA: 3  Anesthesia Plan: General   Post-op Pain Management: Minimal or no pain anticipated, Dilaudid IV and Precedex   Induction: Intravenous  PONV Risk Score and Plan: 4 or greater and Treatment may vary due to age or medical condition and Ondansetron  Airway Management Planned: Oral ETT  Additional Equipment: Arterial line  Intra-op Plan:   Post-operative Plan: Extubation in OR  Informed Consent: I have reviewed the patients History and Physical, chart, labs and discussed the procedure including the risks, benefits and alternatives for the proposed anesthesia with the patient or authorized representative who has indicated his/her understanding and acceptance.     Dental advisory given  Plan Discussed  with: CRNA and Anesthesiologist  Anesthesia Plan Comments: (PAT note written 07/25/2022 by Shonna Chock, PA-C.  )       Anesthesia Quick Evaluation

## 2022-07-28 ENCOUNTER — Inpatient Hospital Stay (HOSPITAL_COMMUNITY): Payer: PPO | Admitting: Vascular Surgery

## 2022-07-28 ENCOUNTER — Inpatient Hospital Stay (HOSPITAL_COMMUNITY): Payer: PPO | Admitting: Anesthesiology

## 2022-07-28 ENCOUNTER — Other Ambulatory Visit: Payer: Self-pay

## 2022-07-28 ENCOUNTER — Inpatient Hospital Stay (HOSPITAL_COMMUNITY)
Admission: RE | Admit: 2022-07-28 | Discharge: 2022-07-29 | DRG: 039 | Disposition: A | Discharge status: Home / Self Care | Payer: PPO | Attending: Vascular Surgery | Admitting: Vascular Surgery

## 2022-07-28 ENCOUNTER — Encounter (HOSPITAL_COMMUNITY)
Admission: RE | Disposition: A | Discharge status: Home / Self Care | Payer: Self-pay | Source: Home / Self Care | Attending: Vascular Surgery

## 2022-07-28 ENCOUNTER — Encounter (HOSPITAL_COMMUNITY): Payer: Self-pay | Admitting: Vascular Surgery

## 2022-07-28 DIAGNOSIS — G629 Polyneuropathy, unspecified: Secondary | ICD-10-CM | POA: Diagnosis not present

## 2022-07-28 DIAGNOSIS — Z83438 Family history of other disorder of lipoprotein metabolism and other lipidemia: Secondary | ICD-10-CM | POA: Diagnosis not present

## 2022-07-28 DIAGNOSIS — Z560 Unemployment, unspecified: Secondary | ICD-10-CM

## 2022-07-28 DIAGNOSIS — I10 Essential (primary) hypertension: Secondary | ICD-10-CM

## 2022-07-28 DIAGNOSIS — Z8249 Family history of ischemic heart disease and other diseases of the circulatory system: Secondary | ICD-10-CM | POA: Diagnosis not present

## 2022-07-28 DIAGNOSIS — N183 Chronic kidney disease, stage 3 unspecified: Secondary | ICD-10-CM | POA: Diagnosis not present

## 2022-07-28 DIAGNOSIS — E785 Hyperlipidemia, unspecified: Secondary | ICD-10-CM | POA: Diagnosis not present

## 2022-07-28 DIAGNOSIS — Z8673 Personal history of transient ischemic attack (TIA), and cerebral infarction without residual deficits: Secondary | ICD-10-CM

## 2022-07-28 DIAGNOSIS — Z86718 Personal history of other venous thrombosis and embolism: Secondary | ICD-10-CM | POA: Diagnosis not present

## 2022-07-28 DIAGNOSIS — E7800 Pure hypercholesterolemia, unspecified: Secondary | ICD-10-CM | POA: Diagnosis not present

## 2022-07-28 DIAGNOSIS — I739 Peripheral vascular disease, unspecified: Secondary | ICD-10-CM | POA: Diagnosis not present

## 2022-07-28 DIAGNOSIS — Z833 Family history of diabetes mellitus: Secondary | ICD-10-CM | POA: Diagnosis not present

## 2022-07-28 DIAGNOSIS — I6522 Occlusion and stenosis of left carotid artery: Secondary | ICD-10-CM | POA: Diagnosis not present

## 2022-07-28 DIAGNOSIS — I252 Old myocardial infarction: Secondary | ICD-10-CM

## 2022-07-28 DIAGNOSIS — I1310 Hypertensive heart and chronic kidney disease without heart failure, with stage 1 through stage 4 chronic kidney disease, or unspecified chronic kidney disease: Secondary | ICD-10-CM | POA: Diagnosis present

## 2022-07-28 DIAGNOSIS — B351 Tinea unguium: Secondary | ICD-10-CM | POA: Diagnosis present

## 2022-07-28 DIAGNOSIS — D649 Anemia, unspecified: Secondary | ICD-10-CM | POA: Diagnosis not present

## 2022-07-28 DIAGNOSIS — R29810 Facial weakness: Secondary | ICD-10-CM | POA: Diagnosis present

## 2022-07-28 HISTORY — PX: ENDARTERECTOMY: SHX5162

## 2022-07-28 HISTORY — PX: PATCH ANGIOPLASTY: SHX6230

## 2022-07-28 LAB — CBC
HCT: 40.3 % (ref 39.0–52.0)
Hemoglobin: 13 g/dL (ref 13.0–17.0)
MCH: 28.8 pg (ref 26.0–34.0)
MCHC: 32.3 g/dL (ref 30.0–36.0)
MCV: 89.2 fL (ref 80.0–100.0)
Platelets: 165 10*3/uL (ref 150–400)
RBC: 4.52 MIL/uL (ref 4.22–5.81)
RDW: 13 % (ref 11.5–15.5)
WBC: 8.5 10*3/uL (ref 4.0–10.5)
nRBC: 0 % (ref 0.0–0.2)

## 2022-07-28 LAB — POCT ACTIVATED CLOTTING TIME
Activated Clotting Time: 250 seconds
Activated Clotting Time: 271 seconds
Activated Clotting Time: 260 seconds

## 2022-07-28 LAB — CREATININE, SERUM
Creatinine, Ser: 2.04 mg/dL — ABNORMAL HIGH (ref 0.61–1.24)
GFR, Estimated: 35 mL/min — ABNORMAL LOW (ref >60)

## 2022-07-28 SURGERY — ENDARTERECTOMY, CAROTID
Anesthesia: General | Site: Neck | Laterality: Left

## 2022-07-28 MED ORDER — CEFAZOLIN SODIUM-DEXTROSE 2-4 GM/100ML-% IV SOLN
INTRAVENOUS | Status: AC
  Filled 2022-07-28: qty 100

## 2022-07-28 MED ORDER — CEFAZOLIN SODIUM-DEXTROSE 2-4 GM/100ML-% IV SOLN
2.0000 g | Freq: Three times a day (TID) | INTRAVENOUS | Status: AC
  Administered 2022-07-28 – 2022-07-29 (×3): 2 g via INTRAVENOUS
  Filled 2022-07-28 (×3): qty 100

## 2022-07-28 MED ORDER — HYDROMORPHONE HCL 1 MG/ML IJ SOLN
0.2500 mg | INTRAMUSCULAR | Status: DC | PRN
  Administered 2022-07-28 (×2): 0.5 mg via INTRAVENOUS
  Administered 2022-07-28: 0.25 mg via INTRAVENOUS
  Administered 2022-07-28: 0.5 mg via INTRAVENOUS

## 2022-07-28 MED ORDER — LACTATED RINGERS IV SOLN: INTRAVENOUS | Status: DC | PRN

## 2022-07-28 MED ORDER — CHLORHEXIDINE GLUCONATE CLOTH 2 % EX PADS: 6.0000 | MEDICATED_PAD | Freq: Once | CUTANEOUS | Status: DC

## 2022-07-28 MED ORDER — PROPOFOL 10 MG/ML IV BOLUS
INTRAVENOUS | Status: AC
  Filled 2022-07-28: qty 20

## 2022-07-28 MED ORDER — PHENOL 1.4 % MT LIQD: 1.0000 | OROMUCOSAL | Status: DC | PRN

## 2022-07-28 MED ORDER — HYDROMORPHONE HCL 1 MG/ML IJ SOLN
INTRAMUSCULAR | Status: AC
  Filled 2022-07-28: qty 1

## 2022-07-28 MED ORDER — OXYCODONE HCL 5 MG PO TABS: 5.0000 mg | ORAL_TABLET | Freq: Once | ORAL | Status: DC | PRN

## 2022-07-28 MED ORDER — OXYCODONE HCL 5 MG/5ML PO SOLN: 5.0000 mg | Freq: Once | ORAL | Status: DC | PRN

## 2022-07-28 MED ORDER — FENTANYL CITRATE (PF) 250 MCG/5ML IJ SOLN
INTRAMUSCULAR | Status: AC
  Filled 2022-07-28: qty 5

## 2022-07-28 MED ORDER — PHENYLEPHRINE 80 MCG/ML (10ML) SYRINGE FOR IV PUSH (FOR BLOOD PRESSURE SUPPORT)
PREFILLED_SYRINGE | INTRAVENOUS | Status: DC | PRN
  Administered 2022-07-28: 80 ug via INTRAVENOUS
  Administered 2022-07-28: 160 ug via INTRAVENOUS

## 2022-07-28 MED ORDER — PROPOFOL 500 MG/50ML IV EMUL
INTRAVENOUS | Status: DC | PRN
  Administered 2022-07-28: 125 ug/kg/min via INTRAVENOUS

## 2022-07-28 MED ORDER — ASPIRIN 81 MG PO TBEC
81.0000 mg | DELAYED_RELEASE_TABLET | Freq: Every day | ORAL | Status: DC
  Administered 2022-07-29: 81 mg via ORAL
  Filled 2022-07-28: qty 1

## 2022-07-28 MED ORDER — PHENYLEPHRINE HCL-NACL 20-0.9 MG/250ML-% IV SOLN
INTRAVENOUS | Status: DC | PRN
  Administered 2022-07-28: 25 ug/min via INTRAVENOUS

## 2022-07-28 MED ORDER — ALUM & MAG HYDROXIDE-SIMETH 200-200-20 MG/5ML PO SUSP: 15.0000 mL | ORAL | Status: DC | PRN

## 2022-07-28 MED ORDER — GUAIFENESIN-DM 100-10 MG/5ML PO SYRP: 15.0000 mL | ORAL_SOLUTION | ORAL | Status: DC | PRN

## 2022-07-28 MED ORDER — CHLORHEXIDINE GLUCONATE 0.12 % MT SOLN
OROMUCOSAL | Status: AC
  Administered 2022-07-28: 15 mL
  Filled 2022-07-28: qty 15

## 2022-07-28 MED ORDER — DEXAMETHASONE SODIUM PHOSPHATE 10 MG/ML IJ SOLN
INTRAMUSCULAR | Status: DC | PRN
  Administered 2022-07-28: 8 mg via INTRAVENOUS

## 2022-07-28 MED ORDER — 0.9 % SODIUM CHLORIDE (POUR BTL) OPTIME
TOPICAL | Status: DC | PRN
  Administered 2022-07-28: 1000 mL

## 2022-07-28 MED ORDER — LIDOCAINE 2% (20 MG/ML) 5 ML SYRINGE
INTRAMUSCULAR | Status: DC | PRN
  Administered 2022-07-28: 80 mg via INTRAVENOUS

## 2022-07-28 MED ORDER — HYDRALAZINE HCL 20 MG/ML IJ SOLN: 5.0000 mg | INTRAMUSCULAR | Status: DC | PRN

## 2022-07-28 MED ORDER — DEXTRAN 40 IN SALINE 10-0.9 % IV SOLN
INTRAVENOUS | Status: AC | PRN
  Administered 2022-07-28: 500 mL

## 2022-07-28 MED ORDER — CLOPIDOGREL BISULFATE 75 MG PO TABS
75.0000 mg | ORAL_TABLET | Freq: Every day | ORAL | Status: DC
  Administered 2022-07-29: 75 mg via ORAL
  Filled 2022-07-28: qty 1

## 2022-07-28 MED ORDER — HYDRALAZINE HCL 50 MG PO TABS
100.0000 mg | ORAL_TABLET | Freq: Three times a day (TID) | ORAL | Status: DC
  Administered 2022-07-28 – 2022-07-29 (×3): 100 mg via ORAL
  Filled 2022-07-28 (×6): qty 2

## 2022-07-28 MED ORDER — PROPOFOL 10 MG/ML IV BOLUS
INTRAVENOUS | Status: DC | PRN
  Administered 2022-07-28: 120 mg via INTRAVENOUS
  Administered 2022-07-28: 30 mg via INTRAVENOUS

## 2022-07-28 MED ORDER — POTASSIUM CHLORIDE CRYS ER 20 MEQ PO TBCR: 20.0000 meq | EXTENDED_RELEASE_TABLET | Freq: Every day | ORAL | Status: DC | PRN

## 2022-07-28 MED ORDER — HEPARIN 6000 UNIT IRRIGATION SOLUTION
Status: AC
  Filled 2022-07-28: qty 500

## 2022-07-28 MED ORDER — ONDANSETRON HCL 4 MG/2ML IJ SOLN: 4.0000 mg | Freq: Once | INTRAMUSCULAR | Status: DC | PRN

## 2022-07-28 MED ORDER — PANTOPRAZOLE SODIUM 40 MG PO TBEC
40.0000 mg | DELAYED_RELEASE_TABLET | Freq: Every day | ORAL | Status: DC
  Administered 2022-07-28 – 2022-07-29 (×2): 40 mg via ORAL
  Filled 2022-07-28 (×2): qty 1

## 2022-07-28 MED ORDER — FENTANYL CITRATE (PF) 250 MCG/5ML IJ SOLN
INTRAMUSCULAR | Status: DC | PRN
  Administered 2022-07-28: 100 ug via INTRAVENOUS

## 2022-07-28 MED ORDER — ROCURONIUM BROMIDE 10 MG/ML (PF) SYRINGE
PREFILLED_SYRINGE | INTRAVENOUS | Status: DC | PRN
  Administered 2022-07-28: 15 mg via INTRAVENOUS
  Administered 2022-07-28: 70 mg via INTRAVENOUS

## 2022-07-28 MED ORDER — HEMOSTATIC AGENTS (NO CHARGE) OPTIME
TOPICAL | Status: DC | PRN
  Administered 2022-07-28: 4 via TOPICAL

## 2022-07-28 MED ORDER — METOPROLOL TARTRATE 5 MG/5ML IV SOLN: 2.0000 mg | INTRAVENOUS | Status: DC | PRN

## 2022-07-28 MED ORDER — MAGNESIUM SULFATE 2 GM/50ML IV SOLN: 2.0000 g | Freq: Every day | INTRAVENOUS | Status: DC | PRN

## 2022-07-28 MED ORDER — ATORVASTATIN CALCIUM 80 MG PO TABS
80.0000 mg | ORAL_TABLET | Freq: Every day | ORAL | Status: DC
  Administered 2022-07-28 – 2022-07-29 (×2): 80 mg via ORAL
  Filled 2022-07-28 (×2): qty 1

## 2022-07-28 MED ORDER — CARVEDILOL 25 MG PO TABS
25.0000 mg | ORAL_TABLET | Freq: Two times a day (BID) | ORAL | Status: DC
  Administered 2022-07-28 – 2022-07-29 (×2): 25 mg via ORAL
  Filled 2022-07-28 (×2): qty 1

## 2022-07-28 MED ORDER — ACETAMINOPHEN 325 MG PO TABS
325.0000 mg | ORAL_TABLET | ORAL | Status: DC | PRN
  Administered 2022-07-29: 650 mg via ORAL
  Filled 2022-07-28: qty 2

## 2022-07-28 MED ORDER — LIDOCAINE HCL (PF) 1 % IJ SOLN
INTRAMUSCULAR | Status: AC
  Filled 2022-07-28: qty 5

## 2022-07-28 MED ORDER — PROTAMINE SULFATE 10 MG/ML IV SOLN
INTRAVENOUS | Status: DC | PRN
  Administered 2022-07-28: 50 mg via INTRAVENOUS

## 2022-07-28 MED ORDER — SODIUM CHLORIDE 0.9 % IV SOLN: INTRAVENOUS | Status: DC

## 2022-07-28 MED ORDER — OXYCODONE-ACETAMINOPHEN 5-325 MG PO TABS
1.0000 | ORAL_TABLET | ORAL | Status: DC | PRN
  Administered 2022-07-28 – 2022-07-29 (×3): 2 via ORAL
  Filled 2022-07-28 (×3): qty 2

## 2022-07-28 MED ORDER — ONDANSETRON HCL 4 MG/2ML IJ SOLN
INTRAMUSCULAR | Status: DC | PRN
  Administered 2022-07-28: 4 mg via INTRAVENOUS

## 2022-07-28 MED ORDER — DOCUSATE SODIUM 100 MG PO CAPS
100.0000 mg | ORAL_CAPSULE | Freq: Every day | ORAL | Status: DC
  Administered 2022-07-29: 100 mg via ORAL
  Filled 2022-07-28: qty 1

## 2022-07-28 MED ORDER — HEPARIN 6000 UNIT IRRIGATION SOLUTION
Status: DC | PRN
  Administered 2022-07-28: 1

## 2022-07-28 MED ORDER — ACETAMINOPHEN 650 MG RE SUPP: 325.0000 mg | RECTAL | Status: DC | PRN

## 2022-07-28 MED ORDER — LABETALOL HCL 5 MG/ML IV SOLN: 10.0000 mg | INTRAVENOUS | Status: DC | PRN

## 2022-07-28 MED ORDER — ONDANSETRON HCL 4 MG/2ML IJ SOLN: 4.0000 mg | Freq: Four times a day (QID) | INTRAMUSCULAR | Status: DC | PRN

## 2022-07-28 MED ORDER — SODIUM CHLORIDE 0.9 % IV SOLN
0.1500 ug/kg/min | INTRAVENOUS | Status: DC
  Administered 2022-07-28: .2 ug/kg/min via INTRAVENOUS
  Filled 2022-07-28 (×2): qty 2000

## 2022-07-28 MED ORDER — CEFAZOLIN SODIUM-DEXTROSE 2-4 GM/100ML-% IV SOLN
2.0000 g | INTRAVENOUS | Status: AC
  Administered 2022-07-28: 2 g via INTRAVENOUS

## 2022-07-28 MED ORDER — HYDROMORPHONE HCL 1 MG/ML IJ SOLN
0.5000 mg | INTRAMUSCULAR | Status: DC | PRN
  Administered 2022-07-28 – 2022-07-29 (×3): 0.5 mg via INTRAVENOUS
  Filled 2022-07-28 (×3): qty 0.5

## 2022-07-28 MED ORDER — SUGAMMADEX SODIUM 200 MG/2ML IV SOLN
INTRAVENOUS | Status: DC | PRN
  Administered 2022-07-28: 200 mg via INTRAVENOUS

## 2022-07-28 MED ORDER — HEPARIN SODIUM (PORCINE) 5000 UNIT/ML IJ SOLN
5000.0000 [IU] | Freq: Three times a day (TID) | INTRAMUSCULAR | Status: DC
  Administered 2022-07-29: 5000 [IU] via SUBCUTANEOUS
  Filled 2022-07-28: qty 1

## 2022-07-28 MED ORDER — SODIUM CHLORIDE 0.9 % IV SOLN: 500.0000 mL | Freq: Once | INTRAVENOUS | Status: DC | PRN

## 2022-07-28 MED ORDER — ONDANSETRON HCL 4 MG/2ML IJ SOLN
INTRAMUSCULAR | Status: AC
  Filled 2022-07-28: qty 2

## 2022-07-28 MED ORDER — HEPARIN SODIUM (PORCINE) 1000 UNIT/ML IJ SOLN
INTRAMUSCULAR | Status: DC | PRN
  Administered 2022-07-28: 2000 [IU] via INTRAVENOUS
  Administered 2022-07-28: 9000 [IU] via INTRAVENOUS

## 2022-07-28 SURGICAL SUPPLY — 59 items
ADH SKN CLS APL DERMABOND .7 (GAUZE/BANDAGES/DRESSINGS) ×1
ADH SKN CLS LQ APL DERMABOND (GAUZE/BANDAGES/DRESSINGS) ×1
ADPR TBG 2 MALE LL ART (MISCELLANEOUS)
AGENT HMST KT MTR STRL THRMB (HEMOSTASIS)
BAG COUNTER SPONGE SURGICOUNT (BAG) ×1 IMPLANT
BAG SPNG CNTER NS LX DISP (BAG) ×1
CANISTER SUCT 3000ML PPV (MISCELLANEOUS) ×1 IMPLANT
CANNULA VESSEL 3MM 2 BLNT TIP (CANNULA) ×1 IMPLANT
CATH ROBINSON RED A/P 18FR (CATHETERS) ×1 IMPLANT
CLIP TI MEDIUM 24 (CLIP) ×1 IMPLANT
CLIP TI WIDE RED SMALL 24 (CLIP) ×1 IMPLANT
COVER PROBE W GEL 5X96 (DRAPES) ×1 IMPLANT
COVER TRANSDUCER ULTRASND GEL (DISPOSABLE) IMPLANT
DERMABOND ADVANCED .7 DNX12 (GAUZE/BANDAGES/DRESSINGS) ×1 IMPLANT
DERMABOND ADVANCED .7 DNX6 (GAUZE/BANDAGES/DRESSINGS) IMPLANT
DRAPE INCISE IOBAN 66X45 STRL (DRAPES) IMPLANT
ELECT REM PT RETURN 9FT ADLT (ELECTROSURGICAL) ×1
ELECTRODE REM PT RTRN 9FT ADLT (ELECTROSURGICAL) ×1 IMPLANT
GLOVE BIOGEL PI IND STRL 8 (GLOVE) ×1 IMPLANT
GOWN STRL REUS W/ TWL LRG LVL3 (GOWN DISPOSABLE) ×2 IMPLANT
GOWN STRL REUS W/TWL 2XL LVL3 (GOWN DISPOSABLE) ×2 IMPLANT
GOWN STRL REUS W/TWL LRG LVL3 (GOWN DISPOSABLE) ×2
HEMOSTAT SNOW SURGICEL 2X4 (HEMOSTASIS) IMPLANT
IV ADAPTER SYR DOUBLE MALE LL (MISCELLANEOUS) IMPLANT
KIT BASIN OR (CUSTOM PROCEDURE TRAY) ×1 IMPLANT
KIT SHUNT ARGYLE CAROTID ART 6 (VASCULAR PRODUCTS) IMPLANT
KIT TURNOVER KIT B (KITS) ×1 IMPLANT
LOOP VASCULAR MINI 18 RED (MISCELLANEOUS) ×1
NDL HYPO 25GX1X1/2 BEV (NEEDLE) IMPLANT
NDL SPNL 20GX3.5 QUINCKE YW (NEEDLE) IMPLANT
NEEDLE HYPO 25GX1X1/2 BEV (NEEDLE) IMPLANT
NEEDLE SPNL 20GX3.5 QUINCKE YW (NEEDLE) IMPLANT
NS IRRIG 1000ML POUR BTL (IV SOLUTION) ×3 IMPLANT
PACK CAROTID (CUSTOM PROCEDURE TRAY) ×1 IMPLANT
PAD ARMBOARD 7.5X6 YLW CONV (MISCELLANEOUS) ×2 IMPLANT
PATCH VASC XENOSURE 1CMX6CM (Vascular Products) ×1 IMPLANT
PATCH VASC XENOSURE 1X6 (Vascular Products) IMPLANT
POSITIONER HEAD DONUT 9IN (MISCELLANEOUS) ×1 IMPLANT
SET WALTER ACTIVATION W/DRAPE (SET/KITS/TRAYS/PACK) ×1 IMPLANT
SHUNT CAROTID BYPASS 10 (VASCULAR PRODUCTS) IMPLANT
SHUNT CAROTID BYPASS 12FRX15.5 (VASCULAR PRODUCTS) IMPLANT
SPONGE SURGIFOAM ABS GEL 100 (HEMOSTASIS) IMPLANT
STOPCOCK 4 WAY LG BORE MALE ST (IV SETS) IMPLANT
SURGIFLO W/THROMBIN 8M KIT (HEMOSTASIS) IMPLANT
SUT ETHILON 3 0 PS 1 (SUTURE) IMPLANT
SUT MNCRL AB 4-0 PS2 18 (SUTURE) ×1 IMPLANT
SUT PROLENE 6 0 BV (SUTURE) ×1 IMPLANT
SUT SILK 3 0 (SUTURE)
SUT SILK 3-0 18XBRD TIE 12 (SUTURE) IMPLANT
SUT VIC AB 2-0 CT1 27 (SUTURE) ×1
SUT VIC AB 2-0 CT1 TAPERPNT 27 (SUTURE) ×1 IMPLANT
SUT VIC AB 3-0 SH 27 (SUTURE) ×1
SUT VIC AB 3-0 SH 27X BRD (SUTURE) ×1 IMPLANT
SYR 20CC LL (SYRINGE) ×2 IMPLANT
SYR CONTROL 10ML LL (SYRINGE) IMPLANT
TOWEL GREEN STERILE (TOWEL DISPOSABLE) ×1 IMPLANT
TUBING ART PRESS 48 MALE/FEM (TUBING) IMPLANT
VASCULAR TIE MINI RED 18IN STL (MISCELLANEOUS) IMPLANT
WATER STERILE IRR 1000ML POUR (IV SOLUTION) ×1 IMPLANT

## 2022-07-28 NOTE — Transfer of Care (Signed)
Immediate Anesthesia Transfer of Care Note  Patient: Billy [name]  Procedure(s) Performed: LEFT ENDARTERECTOMY CAROTID (Left: Neck) PATCH ANGIOPLASTY OF LEFT CAROTID ARTERY USING XENOSURE BOVINE PATCH (Left: Neck)  Patient Location: PACU  Anesthesia Type:General  Level of Consciousness: awake and alert   Airway & Oxygen Therapy: Patient Spontanous Breathing  Post-op Assessment: Report given to RN, Post -op Vital signs reviewed and stable, Patient moving all extremities X 4, and Patient able to stick tongue midline  Post vital signs: Reviewed and stable  Last Vitals:  Vitals Value Taken Time  BP 112/75 07/28/22 1045  Temp    Pulse 61 07/28/22 1049  Resp 14 07/28/22 1049  SpO2 96 % 07/28/22 1049  Vitals shown include unvalidated device data.  Last Pain:  Vitals:   07/28/22 0634  TempSrc:   PainSc: 0-No pain         Complications: No notable events documented.

## 2022-07-28 NOTE — Progress Notes (Signed)
     Left neck incision healing well without hematoma Minimal facial droop , no tongue deviation Moving all 4 extremities Speech clear, swallowing OK   Mosetta Pigeon PA-C

## 2022-07-28 NOTE — Addendum Note (Signed)
Addendum  created 07/28/22 1211 by Epifanio Lesches, CRNA   Intraprocedure Meds edited

## 2022-07-28 NOTE — Anesthesia Procedure Notes (Signed)
Arterial Line Insertion Start/End4/02/2023 7:10 AM, 07/28/2022 7:30 AM Performed by: Eilene Ghazi, MD, anesthesiologist  Patient location: Pre-op. Preanesthetic checklist: patient identified, IV checked, site marked, risks and benefits discussed, surgical consent, monitors and equipment checked, pre-op evaluation, timeout performed and anesthesia consent Lidocaine 1% used for infiltration Right, radial was placed Catheter size: 20 G Hand hygiene performed  and maximum sterile barriers used  Allen's test indicative of satisfactory collateral circulation Attempts: 3 Procedure performed using ultrasound guided technique. Ultrasound Notes:anatomy identified, needle tip was noted to be adjacent to the nerve/plexus identified and no ultrasound evidence of intravascular and/or intraneural injection Following insertion, Biopatch and dressing applied. Post procedure assessment: normal  Patient tolerated the procedure well with no immediate complications.

## 2022-07-28 NOTE — Anesthesia Procedure Notes (Signed)
Procedure Name: Intubation Date/Time: 07/28/2022 7:57 AM  Performed by: Epifanio Lesches, CRNAPre-anesthesia Checklist: Patient identified, Emergency Drugs available, Suction available, Timeout performed and Patient being monitored Patient Re-evaluated:Patient Re-evaluated prior to induction Oxygen Delivery Method: Circle system utilized Preoxygenation: Pre-oxygenation with 100% oxygen Induction Type: IV induction Ventilation: Mask ventilation without difficulty Laryngoscope Size: Mac and 4 Grade View: Grade I Tube type: Oral Tube size: 7.5 mm Number of attempts: 1 Airway Equipment and Method: Stylet Placement Confirmation: ETT inserted through vocal cords under direct vision, positive ETCO2, CO2 detector and breath sounds checked- equal and bilateral Secured at: 23 cm Tube secured with: Tape Dental Injury: Teeth and Oropharynx as per pre-operative assessment

## 2022-07-28 NOTE — Anesthesia Postprocedure Evaluation (Signed)
Anesthesia Post Note  Patient: Billy [name]  Procedure(s) Performed: LEFT ENDARTERECTOMY CAROTID (Left: Neck) PATCH ANGIOPLASTY OF LEFT CAROTID ARTERY USING XENOSURE BOVINE PATCH (Left: Neck)     Patient location during evaluation: PACU Anesthesia Type: General Level of consciousness: awake and alert and oriented Pain management: pain level controlled Vital Signs Assessment: post-procedure vital signs reviewed and stable Respiratory status: spontaneous breathing, nonlabored ventilation and respiratory function stable Cardiovascular status: blood pressure returned to baseline and stable Postop Assessment: no apparent nausea or vomiting Anesthetic complications: no   No notable events documented.  Last Vitals:  Vitals:   07/28/22 1100 07/28/22 1115  BP: 129/65 131/63  Pulse: 60 (!) 58  Resp: 12 12  Temp:    SpO2: 95% 93%    Last Pain:  Vitals:   07/28/22 1115  TempSrc:   PainSc: Asleep    LLE Motor Response: Purposeful movement (07/28/22 1115) LLE Sensation: Full sensation (07/28/22 1115) RLE Motor Response: Purposeful movement (07/28/22 1115) RLE Sensation: Full sensation (07/28/22 1115)      Joslyn Ramos A.

## 2022-07-28 NOTE — Op Note (Signed)
NAME: Billy [name]    MRN: [id number] DOB: 1950-07-23    DATE OF OPERATION: 07/28/2022  PREOP DIAGNOSIS:    Left sided asymptomatic carotid artery stenosis  POSTOP DIAGNOSIS:    Same  PROCEDURE:    Left carotid endarterectomy.   SURGEON: Victorino Sparrow  ASSIST: Kayren Eaves. PA  ANESTHESIA: General   EBL: 5ml  INDICATIONS:    Billy [name] is a 72 y.o. male presenting with critical, bilateral internal carotid artery stenosis. I had a long discussion with Billy [name] regarding his stenosis.  We discussed that similar to a water hose, as your finger closes over the opening, the water shoots at a faster speed.  In an artery, this can cause plaque to break off and cause a stroke.  Literature demonstrates a reduction in stroke rate with surgery from 11% to 5% in 5 years, effectively reducing the risk of stroke by half.    Billy [name] has chronic kidney disease, and declined cardiac catheterization in 2019 for NSTEMI. I discussed transcarotid artery revascularization versus carotid endarterectomy.  Being that he has chronic kidney disease, he does not want contrast, therefore Billy [name] would be best carotid endarterectomy.   After discussing the risks and benefits, Billy [name] elected to proceed.    Cleared by cardiology. Has taken his ASA and Plavix this morning.   FINDINGS:   >95% stenosis proximal ICA with ulcerated plaque  TECHNIQUE:   After full informed written consent was obtained from the patient, the patient was brought back to the operating room and placed supine upon the operating table.  Prior to induction, the patient received IV antibiotics.  After obtaining adequate anesthesia, the patient was placed into semi-Fowler position with a shoulder roll in place and the patient's neck slightly hyperextended and rotated away from the surgical site.  The patient was prepped in the standard fashion for a left carotid endarterectomy.  I made an incision anterior to the sternocleidomastoid  muscle and dissected down through the subcutaneous tissue.  The platysmas was opened with electrocautery.  Then I dissected down to the internal jugular vein.  This was dissected posteriorly until I obtained visualization of the common carotid artery.  This was dissected out and then an umbilical tape was placed around the common carotid artery and I loosely applied a Rumel tourniquet.  I then dissected in a periadventitial fashion along the common carotid artery up to the bifurcation.  I then identified the external carotid artery.  I dissected out the external carotid artery and placed a vessel loop around it.  In continuing the dissection to the internal carotid artery, I identified the facial vein.  This was ligated and then transected, giving me improved exposure of the internal carotid artery.  In the process of this dissection, the hypoglossal nerve was identified.  I then dissected out the internal carotid artery until I identified an area of soft tissue in the internal carotid artery.  I dissected slightly distal to this area, and placed an vessel loop around the artery and loosely applied a Rumel tourniquet.  At this point, we gave the patient a therapeutic bolus of Heparin intravenously (roughly 100 units/kg) to an Act greater than 250.  After waiting 3 minutes, then I clamped the internal carotid artery, external carotid artery and then the common carotid artery.  I then made an arteriotomy in the common carotid artery with a 11 blade, and extended the arteriotomy with a Potts scissor down into the common carotid artery, then I carried the  arteriotomy through the bifurcation into the internal carotid artery until I reached an area that was not diseased.  I opened the internal carotid artery and there was excellent, pulsatile back-bleeding. At this point, I started the endarterectomy in the common carotid artery with a Cytogeneticist and carried this dissection down into the common carotid artery  circumferentially.  Then I transected the plaque at a segment where it was adherent.  I then carried this dissection up into the external carotid artery.  The plaque was extracted by unclamping the external carotid artery and everting the artery.  The dissection was then carried into the internal carotid artery, extracting the remaining portion of the carotid plaque.  I passed the plaque off the field as a specimen.  I then spent the next 30 minutes removing intimal flaps and loose debris.  Eventually I reached the point where the residual plaque was densely adherent and any further dissection would compromise the integrity of the wall.  After verifying that there was no more loose intimal flaps or debris, I re-interrogated the entirety of this carotid artery.  At this point, I was satisfied that the minimal remaining disease was densely adherent to the wall and wall integrity was intact.  At this point, I then fashioned a bovine pericardial patch for the geometry of this artery and sewed it in place with two running stitch of 6-0 Prolene. The internal and external arteries were backbled. Then I instilled heparinized saline in this patched artery and then completed the patch angioplasty in the usual fashion.  First, I released the clamp on the external carotid artery, then I released it on the common carotid artery.  After waiting a few seconds, I then released it on the internal carotid artery.  I then interrogated this patient's arteries with the continuous Doppler.  The audible waveforms in each artery were consistent with the expected characteristics for each artery.  The Sonosite probe was then sterilely draped and used to interrogate the carotid artery in both longitudinal and transverse views.  At this point, I washed out the wound, and placed thrombin and Gelfoam throughout.  I also gave the patient 30 mg of protamine to reverse his anticoagulation.   After waiting a few minutes, I removed the thrombin and  Gelfoam and washed out the wound.  There was no more active bleeding in the surgical site.   I then reapproximated the platysma muscle with a running stitch of 2-0 Vicryl.  The skin was then reapproximated with a running subcuticular 4-0 Monocryl stitch.  The skin was then cleaned, dried and Dermabond was used to reinforce the skin closure.  The patient woke without any problems, neurologically intact.      Ladonna Snide, MD [facility] DATE OF DICTATION:   07/28/2022
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
## 2022-07-29 ENCOUNTER — Other Ambulatory Visit (HOSPITAL_COMMUNITY): Payer: Self-pay

## 2022-07-29 LAB — CBC
HCT: 38.9 % — ABNORMAL LOW (ref 39.0–52.0)
Hemoglobin: 13 g/dL (ref 13.0–17.0)
MCH: 29.7 pg (ref 26.0–34.0)
MCHC: 33.4 g/dL (ref 30.0–36.0)
MCV: 88.8 fL (ref 80.0–100.0)
Platelets: 174 10*3/uL (ref 150–400)
RBC: 4.38 MIL/uL (ref 4.22–5.81)
RDW: 12.8 % (ref 11.5–15.5)
WBC: 11.4 10*3/uL — ABNORMAL HIGH (ref 4.0–10.5)
nRBC: 0 % (ref 0.0–0.2)

## 2022-07-29 LAB — BASIC METABOLIC PANEL
Anion gap: 8 (ref 5–15)
BUN: 34 mg/dL — ABNORMAL HIGH (ref 8–23)
CO2: 23 mmol/L (ref 22–32)
Calcium: 8.8 mg/dL — ABNORMAL LOW (ref 8.9–10.3)
Chloride: 107 mmol/L (ref 98–111)
Creatinine, Ser: 2.11 mg/dL — ABNORMAL HIGH (ref 0.61–1.24)
GFR, Estimated: 33 mL/min — ABNORMAL LOW (ref >60)
Glucose, Bld: 135 mg/dL — ABNORMAL HIGH (ref 70–99)
Potassium: 5.5 mmol/L — ABNORMAL HIGH (ref 3.5–5.1)
Sodium: 138 mmol/L (ref 135–145)

## 2022-07-29 LAB — LIPID PANEL
Cholesterol: 130 mg/dL (ref 0–200)
HDL: 39 mg/dL — ABNORMAL LOW (ref >40)
LDL Cholesterol: 75 mg/dL (ref 0–99)
Total CHOL/HDL Ratio: 3.3 RATIO
Triglycerides: 78 mg/dL (ref <150)
VLDL: 16 mg/dL (ref 0–40)

## 2022-07-29 MED ORDER — ORAL CARE MOUTH RINSE: 15.0000 mL | OROMUCOSAL | Status: DC | PRN

## 2022-07-29 MED ORDER — OXYCODONE-ACETAMINOPHEN 5-325 MG PO TABS
1.0000 | ORAL_TABLET | Freq: Four times a day (QID) | ORAL | 0 refills | Status: DC | PRN
  Filled 2022-07-29: qty 12, 3d supply, fill #0

## 2022-07-29 MED ORDER — ASPIRIN 81 MG PO TBEC: 81.0000 mg | DELAYED_RELEASE_TABLET | Freq: Every day | ORAL | 12 refills | Status: DC

## 2022-07-29 MED ORDER — EZETIMIBE 10 MG PO TABS
10.0000 mg | ORAL_TABLET | Freq: Every day | ORAL | 1 refills | Status: DC
  Filled 2022-07-29: qty 30, 30d supply, fill #0

## 2022-07-29 MED ORDER — EZETIMIBE 10 MG PO TABS
10.0000 mg | ORAL_TABLET | Freq: Every day | ORAL | Status: DC
  Administered 2022-07-29: 10 mg via ORAL
  Filled 2022-07-29: qty 1

## 2022-07-29 NOTE — Progress Notes (Addendum)
[facility] [place]  Subjective  - Doing better, feels  a little weak.   Objective (!) 147/65 (!) 57 98 F (36.7 C) (Oral) 15 100%  Intake/Output Summary (Last 24 hours) at 07/29/2022 0759 Last data filed at 07/29/2022 0600 Gross per 24 hour  Intake 1806.56 ml  Output 575 ml  Net 1231.56 ml   Left neck incision healing well without hematoma Minimal facial droop , no tongue deviation Moving all 4 extremities Speech clear, swallowing OK Lungs non labored breathing   Assessment/Planning: Left ICA high grade asymptomatic stenosis  POD # 1 s/p left CEA by Dr. Karin Lieu He has a minimal facial droop, no tongue deviation and moving all 4 extremities Pending mobility, voiding and pain control plan for discharge home today.    F/U in 2-3 weeks for incision check.  Continue ASA, Plavix and statin daily.  Billy [name] 07/29/2022 7:59 AM --  VASCULAR STAFF ADDENDUM: I have independently interviewed and examined the patient. I agree with the above.  ASA/ plavix, will follow up in clinic. Overall, sensory motor intact, slight lip weakness left side. No swallowing, eating, speech difficulties. No signs or symptoms of stroke,  hypoglossal, vagus nerve injury.   Fara Olden, MD [facility] Phone Number: [phone] 07/29/2022 8:07 AM    Laboratory Lab Results: Recent Labs    07/28/22 1501 07/29/22 0153  WBC 8.5 11.4*  HGB 13.0 13.0  HCT 40.3 38.9*  PLT 165 174   BMET Recent Labs    07/28/22 1501 07/29/22 0153  NA  --  138  K  --  5.5*  CL  --  107  CO2  --  23  GLUCOSE  --  135*  BUN  --  34*  CREATININE 2.04* 2.11*  CALCIUM  --  8.8*    COAG Lab Results  Component Value Date   INR 1.2 07/22/2022   INR 1.1 07/27/2018   INR 0.99 12/12/2017   No results found for: "PTT"
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
## 2022-07-29 NOTE — Progress Notes (Signed)
PHARMACIST LIPID MONITORING   Marky Moose is a 72 y.o. male admitted on 07/28/2022 s/p CEA. Pharmacy has been consulted to optimize lipid-lowering therapy with the indication of secondary prevention for clinical ASCVD.  Recent Labs:  Lipid Panel (last 6 months):   Lab Results  Component Value Date   CHOL 130 07/29/2022   TRIG 78 07/29/2022   HDL 39 (L) 07/29/2022   CHOLHDL 3.3 07/29/2022   VLDL 16 07/29/2022   LDLCALC 75 07/29/2022    Hepatic function panel (last 6 months):   Lab Results  Component Value Date   AST 23 07/22/2022   ALT 18 07/22/2022   ALKPHOS 56 07/22/2022   BILITOT 0.6 07/22/2022    SCr (since admission):   Serum creatinine: 2.11 mg/dL (H) 81/01/75 1025 Estimated creatinine clearance: 38.4 mL/min (A)  Current therapy and lipid therapy tolerance Current lipid-lowering therapy: atorvastatin 80mg  daily Documented or reported allergies or intolerances to lipid-lowering therapies (if applicable): none  Plan:    1.Statin intensity (high intensity recommended for all patients regardless of the LDL):  No statin changes. The patient is already on a high intensity statin.  2.Add ezetimibe (if any one of the following):   On a high intensity statin with LDL > 70.  3.Refer to lipid clinic:   No  4.Follow-up with:  Primary care provider - [name], MD  5.Follow-up labs after discharge:  Changes in lipid therapy were made. Check a lipid panel in 8-12 weeks then annually.     Harland German, PharmD Clinical Pharmacist **Pharmacist phone directory can now be found on [email or web address] ([name]).  Listed under [facility].

## 2022-08-01 ENCOUNTER — Telehealth: Payer: Self-pay

## 2022-08-01 NOTE — Telephone Encounter (Signed)
Caller: Patient  Concern: slight facial droop  Location: Left side of face  Description:  no change since surgery, pt can smile, show teeth, stick out tongue, no speech or swallowing abnormalities  Quality:  numbness  Treatments: none  Procedure: Cartoid Endarterectomy  Consulted: McKenzi, PA  Resolution: Instructed to call back if symptoms perist, reassured that numbness will improve after nerves have some time to heal, which should improve the slight facial droop  Next Appt: Appointment to be scheduled for 2-3 wks post-op

## 2022-08-11 ENCOUNTER — Ambulatory Visit (INDEPENDENT_AMBULATORY_CARE_PROVIDER_SITE_OTHER): Payer: PPO | Admitting: Physician Assistant

## 2022-08-11 VITALS — BP 113/63 | HR 72 | Temp 96.8°F | Resp 18 | Ht 74.0 in | Wt 200.7 lb

## 2022-08-11 DIAGNOSIS — I6523 Occlusion and stenosis of bilateral carotid arteries: Secondary | ICD-10-CM

## 2022-08-11 NOTE — Progress Notes (Signed)
  POST OPERATIVE OFFICE NOTE    CC:  F/u for surgery  HPI:  Billy [name] is a 72 y.o. male who is s/p left carotid endarterectomy on 07/28/2022 by Dr. Karin [name].  This was done for asymptomatic high-grade left ICA stenosis.  Postoperatively he had some left-sided facial droop without tongue deviation, slurred speech, or other neurological deficits.  Of note he does have high-grade asymptomatic right carotid artery stenosis.  Dr. Karin [name] has previously discussed with the patient that he will require endarterectomy versus TCAR on the right.  Pt returns today for follow up.  Pt states he has done well since surgery. He still has some left sided lip droop when smiling, but this has not worsened. He denies any tongue deviation, slurred speech, sudden weakness/numbness, or sudden visual changes. His left-sided neck incision has healed well.   Allergies  Allergen Reactions   Amlodipine Swelling    Excessive swelling and skin blotching    Morphine And Related Itching and Other (See Comments)    "Cannot handle it"    Current Outpatient Medications  Medication Sig Dispense Refill   aspirin EC 81 MG tablet Take 1 tablet (81 mg total) by mouth daily. Swallow whole. 30 tablet 12   atorvastatin (LIPITOR) 80 MG tablet TAKE 1 TABLET BY MOUTH EVERY DAY 90 tablet 0   carvedilol (COREG) 25 MG tablet Take 25 mg by mouth 2 (two) times daily.      clopidogrel (PLAVIX) 75 MG tablet Take 1 tablet (75 mg total) by mouth daily. 30 tablet 6   ezetimibe (ZETIA) 10 MG tablet Take 1 tablet (10 mg total) by mouth daily. 30 tablet 1   hydrALAZINE (APRESOLINE) 100 MG tablet Take 100 mg by mouth 3 (three) times daily.     isosorbide mononitrate (IMDUR) 30 MG 24 hr tablet TAKE 1 TABLET BY MOUTH EVERY DAY 90 tablet 3   oxyCODONE-acetaminophen (PERCOCET/ROXICET) 5-325 MG tablet Take 1 tablet by mouth every 6 (six) hours as needed for moderate pain. 12 tablet 0   No current facility-administered medications for this visit.      ROS:  See HPI  Physical Exam:  Incision:  Left sided neck incision well healed without drainage, erythema, or hematoma Extremities:  palpable radial pulses bilaterally Neuro: intact motor and sensation of upper and lower extremities. No slurred speech, tongue deviation. Mild left lip droop.    Assessment/Plan:  This is a 72 y.o. male who is s/p: left carotid endarterectomy on 07/28/2022  -The patient's left sided neck incision is well healed. There is no signs of infection -He still has some mild left sided lip droop that was present immediately after surgery. I have explained to the patient this may take some time to go away. He has no other neurological deficits -He does have high grade ICA stenosis on the right and Dr.Robins has previously stated his right sided will need carotid endarterectomy vs TCAR -Will discuss surgical plans with Dr.Robins and call the patient to schedule his next follow up vs. schedule right-sided carotid intervention   Billy Dubonnet, PA-C [facility] [phone]   Clinic MD:  Billy [name]

## 2022-08-22 ENCOUNTER — Other Ambulatory Visit: Payer: Self-pay

## 2022-08-22 DIAGNOSIS — I6523 Occlusion and stenosis of bilateral carotid arteries: Secondary | ICD-10-CM

## 2022-09-01 ENCOUNTER — Other Ambulatory Visit: Payer: Self-pay

## 2022-09-01 MED ORDER — EZETIMIBE 10 MG PO TABS: 10.0000 mg | ORAL_TABLET | Freq: Every day | ORAL | 0 refills | Status: DC

## 2022-09-14 ENCOUNTER — Other Ambulatory Visit (HOSPITAL_COMMUNITY): Payer: Self-pay

## 2022-09-16 ENCOUNTER — Ambulatory Visit (INDEPENDENT_AMBULATORY_CARE_PROVIDER_SITE_OTHER): Payer: PPO | Admitting: Vascular Surgery

## 2022-09-16 ENCOUNTER — Ambulatory Visit (HOSPITAL_COMMUNITY)
Admission: RE | Admit: 2022-09-16 | Discharge: 2022-09-16 | Disposition: A | Discharge status: Home / Self Care | Payer: PPO | Source: Ambulatory Visit | Attending: Vascular Surgery | Admitting: Vascular Surgery

## 2022-09-16 ENCOUNTER — Encounter: Payer: Self-pay | Admitting: Vascular Surgery

## 2022-09-16 VITALS — BP 154/75 | HR 56 | Temp 97.9°F | Resp 20 | Ht 74.0 in | Wt 200.3 lb

## 2022-09-16 DIAGNOSIS — I6523 Occlusion and stenosis of bilateral carotid arteries: Secondary | ICD-10-CM | POA: Insufficient documentation

## 2022-09-16 DIAGNOSIS — Z9889 Other specified postprocedural states: Secondary | ICD-10-CM

## 2022-09-16 DIAGNOSIS — I6521 Occlusion and stenosis of right carotid artery: Secondary | ICD-10-CM

## 2022-09-16 NOTE — Progress Notes (Signed)
  POST OPERATIVE OFFICE NOTE    CC:  F/u for surgery  HPI:  Billy [name] is a 72 y.o. male who is s/p left carotid endarterectomy on 07/28/2022 by Dr. Karin Lieu.  This was done for asymptomatic high-grade left ICA stenosis.  Postoperatively he had some left-sided facial droop without tongue deviation, slurred speech, or other neurological deficits.  Of note he does have high-grade asymptomatic right carotid artery stenosis.  Dr. Karin Lieu has previously discussed with the patient that he will require endarterectomy versus TCAR on the right.  Pt returns today for follow up.  Pt states he has done well since surgery. He still has some left sided lip droop when smiling, but this has not worsened. He denies any tongue deviation, slurred speech, sudden weakness/numbness, or sudden visual changes. His left-sided neck incision has healed well.   Allergies  Allergen Reactions   Amlodipine Swelling    Excessive swelling and skin blotching    Morphine And Codeine Itching and Other (See Comments)    "Cannot handle it"    Current Outpatient Medications  Medication Sig Dispense Refill   aspirin EC 81 MG tablet Take 1 tablet (81 mg total) by mouth daily. Swallow whole. 30 tablet 12   atorvastatin (LIPITOR) 80 MG tablet TAKE 1 TABLET BY MOUTH EVERY DAY 90 tablet 0   carvedilol (COREG) 25 MG tablet Take 25 mg by mouth 2 (two) times daily.      clopidogrel (PLAVIX) 75 MG tablet Take 1 tablet (75 mg total) by mouth daily. 30 tablet 6   ezetimibe (ZETIA) 10 MG tablet Take 1 tablet (10 mg total) by mouth daily. 30 tablet 0   hydrALAZINE (APRESOLINE) 100 MG tablet Take 100 mg by mouth 3 (three) times daily.     isosorbide mononitrate (IMDUR) 30 MG 24 hr tablet TAKE 1 TABLET BY MOUTH EVERY DAY 90 tablet 3   oxyCODONE-acetaminophen (PERCOCET/ROXICET) 5-325 MG tablet Take 1 tablet by mouth every 6 (six) hours as needed for moderate pain. 12 tablet 0   No current facility-administered medications for this visit.      ROS:  See HPI  Physical Exam:  Incision:  Left sided neck incision well healed without drainage, erythema, or hematoma Extremities:  palpable radial pulses bilaterally Neuro: intact motor and sensation of upper and lower extremities. No slurred speech, tongue deviation. Mild left lip droop.        Assessment/Plan:  This is a 72 y.o. male who is s/p: left carotid endarterectomy on 07/28/2022  -The patient's left sided neck incision is well healed. There is no signs of infection -He still has some mild left sided lip droop that was present immediately after surgery. I have explained to the patient this may take some time to go away. He has no other neurological deficits -He does have high grade ICA stenosis on the right and Dr.Lori-Ann Lindfors has previously stated his right sided will need carotid endarterectomy vs TCAR -Will discuss surgical plans with Dr.Cru Kritikos and call the patient to schedule his next follow up vs. schedule right-sided carotid intervention   Loel Dubonnet, PA-C [facility] [phone]   Clinic MD:  Myra Gianotti

## 2022-09-20 ENCOUNTER — Other Ambulatory Visit: Payer: Self-pay

## 2022-09-20 ENCOUNTER — Telehealth: Payer: Self-pay

## 2022-09-20 DIAGNOSIS — I6521 Occlusion and stenosis of right carotid artery: Secondary | ICD-10-CM

## 2022-09-20 NOTE — Telephone Encounter (Signed)
Patient returned call. Surgery scheduled for 6/27 with instructions provided and will be emailed to patient per request. Patient verbalized understanding.

## 2022-09-20 NOTE — Telephone Encounter (Signed)
Attempted to reach patient to schedule right TCAR, but no answer. Left VM for patient to return call.

## 2022-09-25 ENCOUNTER — Other Ambulatory Visit: Payer: Self-pay | Admitting: Vascular Surgery

## 2022-10-03 NOTE — Progress Notes (Signed)
Surgical Instructions    Your procedure is scheduled on Thursday June 27.  Report to [facility] Main Entrance "A" at 5:30 A.M., then check in with the Admitting office.  Call this number if you have problems the morning of surgery:  [phone]   If you have any questions prior to your surgery date call [phone]: Open Monday-Friday 8am-4pm If you experience any cold or flu symptoms such as cough, fever, chills, shortness of breath, etc. between now and your scheduled surgery, please notify us at the above number     Remember:  Do not eat or drink anything after midnight the night before your surgery.    Take these medicines the morning of surgery with A SIP OF WATER:  aspirin EC 81 MG tablet  carvedilol (COREG)  clopidogrel (PLAVIX)  hydrALAZINE (APRESOLINE)  isosorbide mononitrate (IMDUR)   As of today, STOP taking any Aspirin (unless otherwise instructed by your surgeon) Aleve, Naproxen, Ibuprofen, Motrin, Advil, Goody's, BC's, all herbal medications, fish oil, and all vitamins.           Do not wear jewelry or makeup. Do not wear lotions, powders, perfumes/cologne or deodorant. Do not shave 48 hours prior to surgery.  Men may shave face and neck. Do not bring valuables to the hospital. Do not wear nail polish, gel polish, artificial nails, or any other type of covering on natural nails (fingers and toes) If you have artificial nails or gel coating that need to be removed by a nail salon, please have this removed prior to surgery. Artificial nails or gel coating may interfere with anesthesia's ability to adequately monitor your vital signs.  [facility] is not responsible for any belongings or valuables.    Do NOT Smoke (Tobacco/Vaping)  24 hours prior to your procedure  If you use a CPAP at night, you may bring your mask for your overnight stay.   Contacts, glasses, hearing aids, dentures or partials may not be worn into surgery, please bring cases for these belongings    For patients admitted to the hospital, discharge time will be determined by your treatment team.   Patients discharged the day of surgery will not be allowed to drive home, and someone needs to stay with them for 24 hours.   SURGICAL WAITING ROOM VISITATION Patients having surgery or a procedure may have no more than 2 support people in the waiting area - these visitors may rotate.   Children under the age of 29 must have an adult with them who is not the patient. If the patient needs to stay at the hospital during part of their recovery, the visitor guidelines for inpatient rooms apply. Pre-op nurse will coordinate an appropriate time for 1 support person to accompany patient in pre-op.  This support person may not rotate.   Please refer to [email or web address] for the visitor guidelines for Inpatients (after your surgery is over and you are in a regular room).    Special instructions:    Oral Hygiene is also important to reduce your risk of infection.  Remember - BRUSH YOUR TEETH THE MORNING OF SURGERY WITH YOUR REGULAR TOOTHPASTE   [facility]- Preparing For Surgery  Before surgery, you can play an important role. Because skin is not sterile, your skin needs to be as free of germs as possible. You can reduce the number of germs on your skin by washing with CHG (chlorahexidine gluconate) Soap before surgery.  CHG is an antiseptic cleaner which kills germs and bonds with  the skin to continue killing germs even after washing.     Please do not use if you have an allergy to CHG or antibacterial soaps. If your skin becomes reddened/irritated stop using the CHG.  Do not shave (including legs and underarms) for at least 48 hours prior to first CHG shower. It is OK to shave your face.  Please follow these instructions carefully.     Shower the NIGHT BEFORE SURGERY and the MORNING OF SURGERY with CHG Soap.   If you chose to wash your hair,  wash your hair first as usual with your normal shampoo. After you shampoo, rinse your hair and body thoroughly to remove the shampoo.  Then [employer] and genitals (private parts) with your normal soap and rinse thoroughly to remove soap.  After that Use CHG Soap as you would any other liquid soap. You can apply CHG directly to the skin and wash gently with a scrungie or a clean washcloth.   Apply the CHG Soap to your body ONLY FROM THE NECK DOWN.  Do not use on open wounds or open sores. Avoid contact with your eyes, ears, mouth and genitals (private parts). Wash Face and genitals (private parts)  with your normal soap.   Wash thoroughly, paying special attention to the area where your surgery will be performed.  Thoroughly rinse your body with warm water from the neck down.  DO NOT shower/wash with your normal soap after using and rinsing off the CHG Soap.  Pat yourself dry with a CLEAN TOWEL.  Wear CLEAN PAJAMAS to bed the night before surgery  Place CLEAN SHEETS on your bed the night before your surgery  DO NOT SLEEP WITH PETS.   Day of Surgery:  Take a shower with CHG soap. Wear Clean/Comfortable clothing the morning of surgery Do not apply any deodorants/lotions.   Remember to brush your teeth WITH YOUR REGULAR TOOTHPASTE.    If you received a COVID test during your pre-op visit, it is requested that you wear a mask when out in public, stay away from anyone that may not be feeling well, and notify your surgeon if you develop symptoms. If you have been in contact with anyone that has tested positive in the last 10 days, please notify your surgeon.    Please read over the following fact sheets that you were given.

## 2022-10-04 ENCOUNTER — Encounter (HOSPITAL_COMMUNITY)
Admission: RE | Admit: 2022-10-04 | Discharge: 2022-10-04 | Disposition: A | Discharge status: Home / Self Care | Payer: PPO | Source: Ambulatory Visit | Attending: Vascular Surgery | Admitting: Vascular Surgery

## 2022-10-04 ENCOUNTER — Encounter (HOSPITAL_COMMUNITY): Payer: Self-pay

## 2022-10-04 ENCOUNTER — Other Ambulatory Visit: Payer: Self-pay

## 2022-10-04 VITALS — BP 141/74 | HR 61 | Temp 98.1°F | Resp 20 | Ht 74.0 in | Wt 198.9 lb

## 2022-10-04 DIAGNOSIS — Z8719 Personal history of other diseases of the digestive system: Secondary | ICD-10-CM | POA: Insufficient documentation

## 2022-10-04 DIAGNOSIS — I129 Hypertensive chronic kidney disease with stage 1 through stage 4 chronic kidney disease, or unspecified chronic kidney disease: Secondary | ICD-10-CM | POA: Diagnosis not present

## 2022-10-04 DIAGNOSIS — Z86718 Personal history of other venous thrombosis and embolism: Secondary | ICD-10-CM | POA: Diagnosis not present

## 2022-10-04 DIAGNOSIS — Z7982 Long term (current) use of aspirin: Secondary | ICD-10-CM | POA: Insufficient documentation

## 2022-10-04 DIAGNOSIS — Z01812 Encounter for preprocedural laboratory examination: Secondary | ICD-10-CM | POA: Insufficient documentation

## 2022-10-04 DIAGNOSIS — Z7902 Long term (current) use of antithrombotics/antiplatelets: Secondary | ICD-10-CM | POA: Diagnosis not present

## 2022-10-04 DIAGNOSIS — N183 Chronic kidney disease, stage 3 unspecified: Secondary | ICD-10-CM | POA: Insufficient documentation

## 2022-10-04 DIAGNOSIS — Z95818 Presence of other cardiac implants and grafts: Secondary | ICD-10-CM | POA: Insufficient documentation

## 2022-10-04 DIAGNOSIS — I252 Old myocardial infarction: Secondary | ICD-10-CM | POA: Diagnosis not present

## 2022-10-04 DIAGNOSIS — I6521 Occlusion and stenosis of right carotid artery: Secondary | ICD-10-CM | POA: Diagnosis not present

## 2022-10-04 DIAGNOSIS — Z8673 Personal history of transient ischemic attack (TIA), and cerebral infarction without residual deficits: Secondary | ICD-10-CM | POA: Diagnosis not present

## 2022-10-04 DIAGNOSIS — Z01818 Encounter for other preprocedural examination: Secondary | ICD-10-CM

## 2022-10-04 DIAGNOSIS — I700 Atherosclerosis of aorta: Secondary | ICD-10-CM | POA: Insufficient documentation

## 2022-10-04 DIAGNOSIS — I2510 Atherosclerotic heart disease of native coronary artery without angina pectoris: Secondary | ICD-10-CM | POA: Diagnosis not present

## 2022-10-04 DIAGNOSIS — I4891 Unspecified atrial fibrillation: Secondary | ICD-10-CM | POA: Diagnosis not present

## 2022-10-04 LAB — SURGICAL PCR SCREEN
MRSA, PCR: NEGATIVE
Staphylococcus aureus: NEGATIVE

## 2022-10-04 LAB — COMPREHENSIVE METABOLIC PANEL
ALT: 18 U/L (ref 0–44)
AST: 24 U/L (ref 15–41)
Albumin: 3.6 g/dL (ref 3.5–5.0)
Alkaline Phosphatase: 44 U/L (ref 38–126)
Anion gap: 17 — ABNORMAL HIGH (ref 5–15)
BUN: 41 mg/dL — ABNORMAL HIGH (ref 8–23)
CO2: 19 mmol/L — ABNORMAL LOW (ref 22–32)
Calcium: 9.3 mg/dL (ref 8.9–10.3)
Chloride: 106 mmol/L (ref 98–111)
Creatinine, Ser: 2.17 mg/dL — ABNORMAL HIGH (ref 0.61–1.24)
GFR, Estimated: 32 mL/min — ABNORMAL LOW (ref >60)
Glucose, Bld: 103 mg/dL — ABNORMAL HIGH (ref 70–99)
Potassium: 4.5 mmol/L (ref 3.5–5.1)
Sodium: 142 mmol/L (ref 135–145)
Total Bilirubin: 0.7 mg/dL (ref 0.3–1.2)
Total Protein: 6.4 g/dL — ABNORMAL LOW (ref 6.5–8.1)

## 2022-10-04 LAB — URINALYSIS, ROUTINE W REFLEX MICROSCOPIC
Bacteria, UA: NONE SEEN
Bilirubin Urine: NEGATIVE
Glucose, UA: NEGATIVE mg/dL
Hgb urine dipstick: NEGATIVE
Ketones, ur: NEGATIVE mg/dL
Leukocytes,Ua: NEGATIVE
Nitrite: NEGATIVE
Protein, ur: NEGATIVE mg/dL
Specific Gravity, Urine: 1.02 (ref 1.005–1.030)
pH: 5 (ref 5.0–8.0)

## 2022-10-04 LAB — CBC
HCT: 42.9 % (ref 39.0–52.0)
Hemoglobin: 13.6 g/dL (ref 13.0–17.0)
MCH: 28.5 pg (ref 26.0–34.0)
MCHC: 31.7 g/dL (ref 30.0–36.0)
MCV: 89.9 fL (ref 80.0–100.0)
Platelets: 160 10*3/uL (ref 150–400)
RBC: 4.77 MIL/uL (ref 4.22–5.81)
RDW: 13.9 % (ref 11.5–15.5)
WBC: 6.7 10*3/uL (ref 4.0–10.5)
nRBC: 0 % (ref 0.0–0.2)

## 2022-10-04 LAB — PROTIME-INR
INR: 1.2 (ref 0.8–1.2)
Prothrombin Time: 15.4 seconds — ABNORMAL HIGH (ref 11.4–15.2)

## 2022-10-04 LAB — TYPE AND SCREEN
ABO/RH(D): A POS
Antibody Screen: NEGATIVE

## 2022-10-04 LAB — APTT: aPTT: 34 seconds (ref 24–36)
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
## 2022-10-04 NOTE — Progress Notes (Signed)
PCP - Dr. [name] Cardiologist - Dr. Erlene Quan  PPM/ICD - n/a  Chest x-ray - n/a EKG - 05/31/22 Stress Test - 06/29/22 ECHO - 06/29/22 Cardiac Cath - denies  Sleep Study - denies CPAP - denies  Last dose of GLP1 agonist-  n/a GLP1 instructions: n/a  Blood Thinner Instructions: Plavix, continue taking. Aspirin Instructions: Continue taking.   NPO at MD.  COVID TEST- n/a  Anesthesia review: Yes, cardiac history.  Patient denies shortness of breath, fever, cough and chest pain at PAT appointment   All instructions explained to the patient, with a verbal understanding of the material. Patient agrees to go over the instructions while at home for a better understanding. Patient also instructed to self quarantine after being tested for COVID-19. The opportunity to ask questions was provided.

## 2022-10-04 NOTE — Anesthesia Preprocedure Evaluation (Addendum)
Anesthesia Evaluation  Patient identified by MRN, date of birth, ID band Patient awake    Reviewed: Allergy & Precautions, H&P , NPO status , Patient's Chart, lab work & pertinent test results, reviewed documented beta blocker date and time   History of Anesthesia Complications (+) PONV and history of anesthetic complications  Airway Mallampati: II  TM Distance: >3 FB Neck ROM: Full    Dental no notable dental hx.    Pulmonary neg pulmonary ROS   Pulmonary exam normal breath sounds clear to auscultation       Cardiovascular hypertension (135/60 preop), Pt. on medications and Pt. on home beta blockers + Past MI (2019) and + DVT (2017)  Normal cardiovascular exam+ dysrhythmias Atrial Fibrillation  Rhythm:Regular Rate:Normal  Nuclear stress test 06/29/22:   Findings are consistent with infarction with peri-infarct ischemia. The study is low risk.   No ST deviation was noted.   Left ventricular function is abnormal. Global function is mildly reduced. End diastolic cavity size is normal.   Prior study available for comparison from 11/22/2018.   Low risk stress nuclear study with mostly fixed defect of old basal inferior-inferolateral infarction with minimal peri-infarct ischemia and mildly reduced left ventricular global systolic function.     Echo 06/29/22: IMPRESSIONS   1. Left ventricular ejection fraction, by estimation, is 65 to 70%. The  left ventricle has normal function. The left ventricle has no regional  wall motion abnormalities. Left ventricular diastolic parameters were  normal.   2. Right ventricular systolic function is normal. The right ventricular  size is normal. There is normal pulmonary artery systolic pressure.   3. Left atrial size was mildly dilated.   4. Right atrial size was mildly dilated.   5. The mitral valve is normal in structure. Trivial mitral valve  regurgitation. No evidence of mitral stenosis.    6. The aortic valve is tricuspid. There is mild calcification of the  aortic valve. There is mild thickening of the aortic valve. Aortic valve  regurgitation is not visualized. No aortic stenosis is present.   7. The inferior vena cava is normal in size with greater than 50%  respiratory variability, suggesting right atrial pressure of 3 mmHg.    NSTEMI 2019 in setting of GI bleed, LHC not pursued due to GIB & renal insufficiency 03/2018    Neuro/Psych 80-90 bilateral ICA stenosis on 02/16/22 CTA  s/p left carotid endarterectomy 07/28/22. Postoperatively he had some left-sided facial droop without tongue deviation, slurred speech, or other neurological deficits. 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 TIA (2019) negative psych ROS   GI/Hepatic negative GI ROS, Neg liver ROS,,,  Endo/Other  negative endocrine ROS    Renal/GU Renal Insufficiency and CRFRenal diseaseCr 2.17  negative genitourinary   Musculoskeletal negative musculoskeletal ROS (+)    Abdominal   Peds negative pediatric ROS (+)  Hematology negative hematology ROS (+) Hb 13.6, plt 160   Anesthesia Other Findings   Reproductive/Obstetrics negative OB ROS                              Anesthesia Physical Anesthesia Plan  ASA: 3  Anesthesia Plan: General   Post-op Pain Management: Tylenol PO (pre-op)*   Induction: Intravenous  PONV Risk Score and Plan: 3 and Ondansetron, Dexamethasone, Midazolam and Treatment may vary due to age or medical condition  Airway Management Planned: Oral ETT  Additional Equipment: Arterial line  Intra-op Plan:  Post-operative Plan: Extubation in OR  Informed Consent: I have reviewed the patients History and Physical, chart, labs and discussed the procedure including the risks, benefits and alternatives for the proposed anesthesia with the patient or authorized representative who has indicated his/her understanding and acceptance.      Dental advisory given  Plan Discussed with: CRNA  Anesthesia Plan Comments: (Last airway 07/2022: Laryngoscope Size: Mac and 4 Grade View: Grade I Tube type: Oral Tube size: 7.5 mm Number of attempts: 1   )        Anesthesia Quick Evaluation

## 2022-10-07 ENCOUNTER — Other Ambulatory Visit: Payer: Self-pay | Admitting: Vascular Surgery

## 2022-10-13 ENCOUNTER — Other Ambulatory Visit: Payer: Self-pay

## 2022-10-13 ENCOUNTER — Inpatient Hospital Stay (HOSPITAL_COMMUNITY): Payer: PPO

## 2022-10-13 ENCOUNTER — Inpatient Hospital Stay (HOSPITAL_COMMUNITY): Payer: PPO | Admitting: Vascular Surgery

## 2022-10-13 ENCOUNTER — Inpatient Hospital Stay (HOSPITAL_COMMUNITY)
Admission: RE | Admit: 2022-10-13 | Discharge: 2022-10-14 | DRG: 036 | Disposition: A | Discharge status: Home / Self Care | Payer: PPO | Attending: Vascular Surgery | Admitting: Vascular Surgery

## 2022-10-13 ENCOUNTER — Encounter (HOSPITAL_COMMUNITY)
Admission: RE | Disposition: A | Discharge status: Home / Self Care | Payer: Self-pay | Source: Home / Self Care | Attending: Vascular Surgery

## 2022-10-13 ENCOUNTER — Encounter (HOSPITAL_COMMUNITY): Payer: Self-pay | Admitting: Vascular Surgery

## 2022-10-13 DIAGNOSIS — Z8673 Personal history of transient ischemic attack (TIA), and cerebral infarction without residual deficits: Secondary | ICD-10-CM | POA: Diagnosis not present

## 2022-10-13 DIAGNOSIS — G588 Other specified mononeuropathies: Secondary | ICD-10-CM | POA: Diagnosis present

## 2022-10-13 DIAGNOSIS — I6521 Occlusion and stenosis of right carotid artery: Secondary | ICD-10-CM | POA: Diagnosis not present

## 2022-10-13 DIAGNOSIS — Z885 Allergy status to narcotic agent status: Secondary | ICD-10-CM

## 2022-10-13 DIAGNOSIS — I10 Essential (primary) hypertension: Secondary | ICD-10-CM

## 2022-10-13 DIAGNOSIS — E7800 Pure hypercholesterolemia, unspecified: Secondary | ICD-10-CM | POA: Diagnosis present

## 2022-10-13 DIAGNOSIS — I6529 Occlusion and stenosis of unspecified carotid artery: Principal | ICD-10-CM | POA: Diagnosis present

## 2022-10-13 DIAGNOSIS — I6522 Occlusion and stenosis of left carotid artery: Secondary | ICD-10-CM

## 2022-10-13 DIAGNOSIS — N183 Chronic kidney disease, stage 3 unspecified: Secondary | ICD-10-CM | POA: Diagnosis not present

## 2022-10-13 DIAGNOSIS — D631 Anemia in chronic kidney disease: Secondary | ICD-10-CM | POA: Diagnosis not present

## 2022-10-13 DIAGNOSIS — Z888 Allergy status to other drugs, medicaments and biological substances status: Secondary | ICD-10-CM | POA: Diagnosis not present

## 2022-10-13 DIAGNOSIS — Z006 Encounter for examination for normal comparison and control in clinical research program: Secondary | ICD-10-CM

## 2022-10-13 DIAGNOSIS — I1310 Hypertensive heart and chronic kidney disease without heart failure, with stage 1 through stage 4 chronic kidney disease, or unspecified chronic kidney disease: Secondary | ICD-10-CM | POA: Diagnosis not present

## 2022-10-13 DIAGNOSIS — Z86718 Personal history of other venous thrombosis and embolism: Secondary | ICD-10-CM

## 2022-10-13 DIAGNOSIS — I252 Old myocardial infarction: Secondary | ICD-10-CM | POA: Diagnosis not present

## 2022-10-13 DIAGNOSIS — I4891 Unspecified atrial fibrillation: Secondary | ICD-10-CM

## 2022-10-13 HISTORY — PX: ULTRASOUND GUIDANCE FOR VASCULAR ACCESS: SHX6516

## 2022-10-13 HISTORY — PX: TRANSCAROTID ARTERY REVASCULARIZATIONÂ: SHX6778

## 2022-10-13 LAB — CREATININE, SERUM
Creatinine, Ser: 2.4 mg/dL — ABNORMAL HIGH (ref 0.61–1.24)
GFR, Estimated: 28 mL/min — ABNORMAL LOW (ref >60)

## 2022-10-13 LAB — POCT ACTIVATED CLOTTING TIME
Activated Clotting Time: 238 seconds
Activated Clotting Time: 257 seconds

## 2022-10-13 LAB — CBC
HCT: 38.5 % — ABNORMAL LOW (ref 39.0–52.0)
Hemoglobin: 12.3 g/dL — ABNORMAL LOW (ref 13.0–17.0)
MCH: 29.2 pg (ref 26.0–34.0)
MCHC: 31.9 g/dL (ref 30.0–36.0)
MCV: 91.4 fL (ref 80.0–100.0)
Platelets: 128 10*3/uL — ABNORMAL LOW (ref 150–400)
RBC: 4.21 MIL/uL — ABNORMAL LOW (ref 4.22–5.81)
RDW: 14 % (ref 11.5–15.5)
WBC: 7.1 10*3/uL (ref 4.0–10.5)
nRBC: 0 % (ref 0.0–0.2)

## 2022-10-13 SURGERY — TRANSCAROTID ARTERY REVASCULARIZATION (TCAR)
Anesthesia: General | Site: Neck | Laterality: Right

## 2022-10-13 MED ORDER — DOCUSATE SODIUM 100 MG PO CAPS
100.0000 mg | ORAL_CAPSULE | Freq: Every day | ORAL | Status: DC
  Administered 2022-10-14: 100 mg via ORAL
  Filled 2022-10-13: qty 1

## 2022-10-13 MED ORDER — ROCURONIUM BROMIDE 10 MG/ML (PF) SYRINGE
PREFILLED_SYRINGE | INTRAVENOUS | Status: AC
  Filled 2022-10-13: qty 10

## 2022-10-13 MED ORDER — POTASSIUM CHLORIDE CRYS ER 20 MEQ PO TBCR: 20.0000 meq | EXTENDED_RELEASE_TABLET | Freq: Every day | ORAL | Status: DC | PRN

## 2022-10-13 MED ORDER — PROTAMINE SULFATE 10 MG/ML IV SOLN
INTRAVENOUS | Status: DC | PRN
  Administered 2022-10-13: 50 mg via INTRAVENOUS

## 2022-10-13 MED ORDER — LABETALOL HCL 5 MG/ML IV SOLN: 10.0000 mg | INTRAVENOUS | Status: DC | PRN

## 2022-10-13 MED ORDER — HEPARIN SODIUM (PORCINE) 5000 UNIT/ML IJ SOLN
5000.0000 [IU] | Freq: Three times a day (TID) | INTRAMUSCULAR | Status: DC
  Administered 2022-10-14: 5000 [IU] via SUBCUTANEOUS
  Filled 2022-10-13: qty 1

## 2022-10-13 MED ORDER — HEPARIN 6000 UNIT IRRIGATION SOLUTION
Status: DC | PRN
  Administered 2022-10-13: 1

## 2022-10-13 MED ORDER — HYDROMORPHONE HCL 1 MG/ML IJ SOLN
INTRAMUSCULAR | Status: AC
  Filled 2022-10-13: qty 1

## 2022-10-13 MED ORDER — LACTATED RINGERS IV SOLN: INTRAVENOUS | Status: DC | PRN

## 2022-10-13 MED ORDER — CEFAZOLIN SODIUM-DEXTROSE 2-4 GM/100ML-% IV SOLN
2.0000 g | INTRAVENOUS | Status: AC
  Administered 2022-10-13: 2 g via INTRAVENOUS

## 2022-10-13 MED ORDER — SUGAMMADEX SODIUM 200 MG/2ML IV SOLN
INTRAVENOUS | Status: DC | PRN
  Administered 2022-10-13: 200 mg via INTRAVENOUS

## 2022-10-13 MED ORDER — ACETAMINOPHEN 650 MG RE SUPP: 325.0000 mg | RECTAL | Status: DC | PRN

## 2022-10-13 MED ORDER — DEXAMETHASONE SODIUM PHOSPHATE 10 MG/ML IJ SOLN
INTRAMUSCULAR | Status: AC
  Filled 2022-10-13: qty 1

## 2022-10-13 MED ORDER — CHLORHEXIDINE GLUCONATE CLOTH 2 % EX PADS: 6.0000 | MEDICATED_PAD | Freq: Once | CUTANEOUS | Status: DC

## 2022-10-13 MED ORDER — GUAIFENESIN-DM 100-10 MG/5ML PO SYRP: 15.0000 mL | ORAL_SOLUTION | ORAL | Status: DC | PRN

## 2022-10-13 MED ORDER — PHENYLEPHRINE HCL-NACL 20-0.9 MG/250ML-% IV SOLN
INTRAVENOUS | Status: DC | PRN
  Administered 2022-10-13: 50 ug/min via INTRAVENOUS
  Administered 2022-10-13: 30 ug/min via INTRAVENOUS

## 2022-10-13 MED ORDER — HEPARIN SODIUM (PORCINE) 1000 UNIT/ML IJ SOLN
INTRAMUSCULAR | Status: DC | PRN
  Administered 2022-10-13: 2000 [IU] via INTRAVENOUS
  Administered 2022-10-13: 1000 [IU] via INTRAVENOUS
  Administered 2022-10-13: 9000 [IU] via INTRAVENOUS

## 2022-10-13 MED ORDER — PANTOPRAZOLE SODIUM 40 MG PO TBEC
40.0000 mg | DELAYED_RELEASE_TABLET | Freq: Every day | ORAL | Status: DC
  Administered 2022-10-14: 40 mg via ORAL
  Filled 2022-10-13: qty 1

## 2022-10-13 MED ORDER — MIDAZOLAM HCL 2 MG/2ML IJ SOLN
INTRAMUSCULAR | Status: AC
  Filled 2022-10-13: qty 2

## 2022-10-13 MED ORDER — METOPROLOL TARTRATE 5 MG/5ML IV SOLN: 2.0000 mg | INTRAVENOUS | Status: DC | PRN

## 2022-10-13 MED ORDER — PROPOFOL 10 MG/ML IV BOLUS
INTRAVENOUS | Status: AC
  Filled 2022-10-13: qty 20

## 2022-10-13 MED ORDER — PHENYLEPHRINE 80 MCG/ML (10ML) SYRINGE FOR IV PUSH (FOR BLOOD PRESSURE SUPPORT)
PREFILLED_SYRINGE | INTRAVENOUS | Status: AC
  Filled 2022-10-13: qty 10

## 2022-10-13 MED ORDER — PHENYLEPHRINE 80 MCG/ML (10ML) SYRINGE FOR IV PUSH (FOR BLOOD PRESSURE SUPPORT)
PREFILLED_SYRINGE | INTRAVENOUS | Status: DC | PRN
  Administered 2022-10-13 (×2): 160 ug via INTRAVENOUS

## 2022-10-13 MED ORDER — 0.9 % SODIUM CHLORIDE (POUR BTL) OPTIME
TOPICAL | Status: DC | PRN
  Administered 2022-10-13: 1000 mL

## 2022-10-13 MED ORDER — GLYCOPYRROLATE PF 0.2 MG/ML IJ SOSY
PREFILLED_SYRINGE | INTRAMUSCULAR | Status: AC
  Filled 2022-10-13: qty 1

## 2022-10-13 MED ORDER — ALUM & MAG HYDROXIDE-SIMETH 200-200-20 MG/5ML PO SUSP: 15.0000 mL | ORAL | Status: DC | PRN

## 2022-10-13 MED ORDER — OXYCODONE HCL 5 MG PO TABS: 5.0000 mg | ORAL_TABLET | Freq: Once | ORAL | Status: DC | PRN

## 2022-10-13 MED ORDER — CLEVIDIPINE BUTYRATE 0.5 MG/ML IV EMUL
INTRAVENOUS | Status: AC
  Filled 2022-10-13: qty 50

## 2022-10-13 MED ORDER — FENTANYL CITRATE (PF) 250 MCG/5ML IJ SOLN
INTRAMUSCULAR | Status: DC | PRN
  Administered 2022-10-13: 25 ug via INTRAVENOUS
  Administered 2022-10-13: 100 ug via INTRAVENOUS
  Administered 2022-10-13: 25 ug via INTRAVENOUS

## 2022-10-13 MED ORDER — GLYCOPYRROLATE 0.2 MG/ML IJ SOLN
INTRAMUSCULAR | Status: DC | PRN
  Administered 2022-10-13: .2 mg via INTRAVENOUS

## 2022-10-13 MED ORDER — SODIUM CHLORIDE 0.9 % IV SOLN: INTRAVENOUS | Status: DC

## 2022-10-13 MED ORDER — ATORVASTATIN CALCIUM 80 MG PO TABS: 80.0000 mg | ORAL_TABLET | Freq: Every day | ORAL | Status: DC

## 2022-10-13 MED ORDER — ORAL CARE MOUTH RINSE: 15.0000 mL | Freq: Once | OROMUCOSAL | Status: AC

## 2022-10-13 MED ORDER — ACETAMINOPHEN 325 MG PO TABS: 325.0000 mg | ORAL_TABLET | ORAL | Status: DC | PRN

## 2022-10-13 MED ORDER — MIDAZOLAM HCL 2 MG/2ML IJ SOLN
INTRAMUSCULAR | Status: DC | PRN
  Administered 2022-10-13: 2 mg via INTRAVENOUS

## 2022-10-13 MED ORDER — HEMOSTATIC AGENTS (NO CHARGE) OPTIME
TOPICAL | Status: DC | PRN
  Administered 2022-10-13: 1 via TOPICAL

## 2022-10-13 MED ORDER — ROCURONIUM BROMIDE 10 MG/ML (PF) SYRINGE
PREFILLED_SYRINGE | INTRAVENOUS | Status: DC | PRN
  Administered 2022-10-13: 100 mg via INTRAVENOUS

## 2022-10-13 MED ORDER — OXYCODONE-ACETAMINOPHEN 5-325 MG PO TABS: 1.0000 | ORAL_TABLET | ORAL | Status: DC | PRN

## 2022-10-13 MED ORDER — EPHEDRINE SULFATE-NACL 50-0.9 MG/10ML-% IV SOSY
PREFILLED_SYRINGE | INTRAVENOUS | Status: DC | PRN
  Administered 2022-10-13 (×5): 2.5 mg via INTRAVENOUS
  Administered 2022-10-13: 5 mg via INTRAVENOUS
  Administered 2022-10-13: 2.5 mg via INTRAVENOUS

## 2022-10-13 MED ORDER — CEFAZOLIN SODIUM-DEXTROSE 2-4 GM/100ML-% IV SOLN
2.0000 g | Freq: Three times a day (TID) | INTRAVENOUS | Status: AC
  Administered 2022-10-13 – 2022-10-14 (×2): 2 g via INTRAVENOUS
  Filled 2022-10-13 (×2): qty 100

## 2022-10-13 MED ORDER — LIDOCAINE 2% (20 MG/ML) 5 ML SYRINGE
INTRAMUSCULAR | Status: AC
  Filled 2022-10-13: qty 5

## 2022-10-13 MED ORDER — CARVEDILOL 25 MG PO TABS
25.0000 mg | ORAL_TABLET | Freq: Two times a day (BID) | ORAL | Status: DC
  Administered 2022-10-13 – 2022-10-14 (×2): 25 mg via ORAL
  Filled 2022-10-13 (×2): qty 1

## 2022-10-13 MED ORDER — ONDANSETRON HCL 4 MG/2ML IJ SOLN: 4.0000 mg | Freq: Once | INTRAMUSCULAR | Status: DC | PRN

## 2022-10-13 MED ORDER — HYDROMORPHONE HCL 1 MG/ML IJ SOLN
0.5000 mg | INTRAMUSCULAR | Status: DC | PRN
  Administered 2022-10-13: 0.5 mg via INTRAVENOUS
  Filled 2022-10-13: qty 0.5

## 2022-10-13 MED ORDER — HYDRALAZINE HCL 20 MG/ML IJ SOLN: 5.0000 mg | INTRAMUSCULAR | Status: DC | PRN

## 2022-10-13 MED ORDER — IODIXANOL 320 MG/ML IV SOLN
INTRAVENOUS | Status: DC | PRN
  Administered 2022-10-13: 13 mL via INTRA_ARTERIAL

## 2022-10-13 MED ORDER — SODIUM CHLORIDE 0.9 % IV SOLN: 500.0000 mL | Freq: Once | INTRAVENOUS | Status: DC | PRN

## 2022-10-13 MED ORDER — ASPIRIN 81 MG PO TBEC
81.0000 mg | DELAYED_RELEASE_TABLET | Freq: Every day | ORAL | Status: DC
  Administered 2022-10-14: 81 mg via ORAL
  Filled 2022-10-13: qty 1

## 2022-10-13 MED ORDER — MAGNESIUM SULFATE 2 GM/50ML IV SOLN: 2.0000 g | Freq: Every day | INTRAVENOUS | Status: DC | PRN

## 2022-10-13 MED ORDER — ISOSORBIDE MONONITRATE ER 30 MG PO TB24
30.0000 mg | ORAL_TABLET | Freq: Every day | ORAL | Status: DC
  Administered 2022-10-14: 30 mg via ORAL
  Filled 2022-10-13: qty 1

## 2022-10-13 MED ORDER — CLEVIDIPINE BUTYRATE 0.5 MG/ML IV EMUL
INTRAVENOUS | Status: DC | PRN
  Administered 2022-10-13: 1 mg/h via INTRAVENOUS

## 2022-10-13 MED ORDER — PROPOFOL 10 MG/ML IV BOLUS
INTRAVENOUS | Status: DC | PRN
  Administered 2022-10-13: 150 mg via INTRAVENOUS

## 2022-10-13 MED ORDER — CARVEDILOL 25 MG PO TABS: 25.0000 mg | ORAL_TABLET | Freq: Two times a day (BID) | ORAL | Status: DC

## 2022-10-13 MED ORDER — FENTANYL CITRATE (PF) 250 MCG/5ML IJ SOLN
INTRAMUSCULAR | Status: AC
  Filled 2022-10-13: qty 5

## 2022-10-13 MED ORDER — CHLORHEXIDINE GLUCONATE 0.12 % MT SOLN
15.0000 mL | Freq: Once | OROMUCOSAL | Status: AC
  Administered 2022-10-13: 15 mL via OROMUCOSAL
  Filled 2022-10-13: qty 15

## 2022-10-13 MED ORDER — ASPIRIN 81 MG PO CHEW: 81.0000 mg | CHEWABLE_TABLET | Freq: Once | ORAL | Status: AC

## 2022-10-13 MED ORDER — PROPOFOL 1000 MG/100ML IV EMUL
INTRAVENOUS | Status: AC
  Filled 2022-10-13: qty 100

## 2022-10-13 MED ORDER — PHENOL 1.4 % MT LIQD: 1.0000 | OROMUCOSAL | Status: DC | PRN

## 2022-10-13 MED ORDER — HYDRALAZINE HCL 50 MG PO TABS
100.0000 mg | ORAL_TABLET | Freq: Three times a day (TID) | ORAL | Status: DC
  Administered 2022-10-13 – 2022-10-14 (×3): 100 mg via ORAL
  Filled 2022-10-13 (×3): qty 2

## 2022-10-13 MED ORDER — HEPARIN 6000 UNIT IRRIGATION SOLUTION
Status: AC
  Filled 2022-10-13: qty 500

## 2022-10-13 MED ORDER — HYDROMORPHONE HCL 1 MG/ML IJ SOLN
0.2500 mg | INTRAMUSCULAR | Status: DC | PRN
  Administered 2022-10-13 (×2): 0.5 mg via INTRAVENOUS

## 2022-10-13 MED ORDER — PROTAMINE SULFATE 10 MG/ML IV SOLN
INTRAVENOUS | Status: AC
  Filled 2022-10-13: qty 5

## 2022-10-13 MED ORDER — ONDANSETRON HCL 4 MG/2ML IJ SOLN
INTRAMUSCULAR | Status: AC
  Filled 2022-10-13: qty 2

## 2022-10-13 MED ORDER — LIDOCAINE HCL (PF) 1 % IJ SOLN
INTRAMUSCULAR | Status: AC
  Filled 2022-10-13: qty 30

## 2022-10-13 MED ORDER — LIDOCAINE 2% (20 MG/ML) 5 ML SYRINGE
INTRAMUSCULAR | Status: DC | PRN
  Administered 2022-10-13: 60 mg via INTRAVENOUS

## 2022-10-13 MED ORDER — EZETIMIBE 10 MG PO TABS: 10.0000 mg | ORAL_TABLET | Freq: Every day | ORAL | Status: DC

## 2022-10-13 MED ORDER — ONDANSETRON HCL 4 MG/2ML IJ SOLN: 4.0000 mg | Freq: Four times a day (QID) | INTRAMUSCULAR | Status: DC | PRN

## 2022-10-13 MED ORDER — CEFAZOLIN SODIUM-DEXTROSE 2-4 GM/100ML-% IV SOLN
INTRAVENOUS | Status: AC
  Filled 2022-10-13: qty 100

## 2022-10-13 MED ORDER — ONDANSETRON HCL 4 MG/2ML IJ SOLN
INTRAMUSCULAR | Status: DC | PRN
  Administered 2022-10-13: 4 mg via INTRAVENOUS

## 2022-10-13 MED ORDER — ASPIRIN 81 MG PO CHEW
CHEWABLE_TABLET | ORAL | Status: AC
  Administered 2022-10-13: 81 mg via ORAL
  Filled 2022-10-13: qty 1

## 2022-10-13 MED ORDER — ACETAMINOPHEN 500 MG PO TABS
1000.0000 mg | ORAL_TABLET | Freq: Once | ORAL | Status: AC
  Administered 2022-10-13: 1000 mg via ORAL
  Filled 2022-10-13: qty 2

## 2022-10-13 MED ORDER — OXYCODONE HCL 5 MG/5ML PO SOLN: 5.0000 mg | Freq: Once | ORAL | Status: DC | PRN

## 2022-10-13 MED ORDER — GLYCOPYRROLATE PF 0.2 MG/ML IJ SOSY
PREFILLED_SYRINGE | INTRAMUSCULAR | Status: AC
  Filled 2022-10-13: qty 2

## 2022-10-13 MED ORDER — CLOPIDOGREL BISULFATE 75 MG PO TABS
75.0000 mg | ORAL_TABLET | Freq: Every day | ORAL | Status: DC
  Administered 2022-10-14: 75 mg via ORAL
  Filled 2022-10-13: qty 1

## 2022-10-13 SURGICAL SUPPLY — 51 items
ADH SKN CLS APL DERMABOND .7 (GAUZE/BANDAGES/DRESSINGS) ×4
BAG BANDED W/RUBBER/TAPE 36X54 (MISCELLANEOUS) ×2 IMPLANT
BAG COUNTER SPONGE SURGICOUNT (BAG) ×2 IMPLANT
BAG EQP BAND 135X91 W/RBR TAPE (MISCELLANEOUS) ×2
BAG SPNG CNTER NS LX DISP (BAG) ×2
BALLN STERLING RX 5X30X80 (BALLOONS) ×2
BALLOON STERLING RX 5X30X80 (BALLOONS) IMPLANT
CANISTER SUCT 3000ML PPV (MISCELLANEOUS) ×2 IMPLANT
CATH ROBINSON RED A/P 18FR (CATHETERS) IMPLANT
CLIP TI MEDIUM 6 (CLIP) ×2 IMPLANT
CLIP TI WIDE RED SMALL 6 (CLIP) ×2 IMPLANT
COVER DOME SNAP 22 D (MISCELLANEOUS) ×2 IMPLANT
COVER PROBE W GEL 5X96 (DRAPES) ×2 IMPLANT
DERMABOND ADVANCED .7 DNX12 (GAUZE/BANDAGES/DRESSINGS) ×2 IMPLANT
DRAPE FEMORAL ANGIO 80X135IN (DRAPES) ×2 IMPLANT
ELECT REM PT RETURN 9FT ADLT (ELECTROSURGICAL) ×2
ELECTRODE REM PT RTRN 9FT ADLT (ELECTROSURGICAL) ×2 IMPLANT
GOWN STRL REUS W/ TWL LRG LVL3 (GOWN DISPOSABLE) ×4 IMPLANT
GOWN STRL REUS W/TWL 2XL LVL3 (GOWN DISPOSABLE) ×4 IMPLANT
GOWN STRL REUS W/TWL LRG LVL3 (GOWN DISPOSABLE) ×4
GUIDEWIRE ENROUTE 0.014 (WIRE) ×2 IMPLANT
HEMOSTAT SNOW SURGICEL 2X4 (HEMOSTASIS) IMPLANT
INTRODUCER KIT GALT 7CM (INTRODUCER) ×2
KIT BASIN OR (CUSTOM PROCEDURE TRAY) ×2 IMPLANT
KIT ENCORE 26 ADVANTAGE (KITS) ×2 IMPLANT
KIT INTRODUCER GALT 7 (INTRODUCER) ×2 IMPLANT
KIT TURNOVER KIT B (KITS) ×2 IMPLANT
NDL HYPO 25GX1X1/2 BEV (NEEDLE) IMPLANT
NEEDLE HYPO 25GX1X1/2 BEV (NEEDLE) IMPLANT
PACK CAROTID (CUSTOM PROCEDURE TRAY) ×2 IMPLANT
POSITIONER HEAD DONUT 9IN (MISCELLANEOUS) ×2 IMPLANT
PROTECTION STATION PRESSURIZED (MISCELLANEOUS) ×2
SET MICROPUNCTURE 5F STIFF (MISCELLANEOUS) ×2 IMPLANT
STATION PROTECTION PRESSURIZED (MISCELLANEOUS) ×2 IMPLANT
STENT SYS TRANSCAROTID 10-8X40 (Permanent Stent) IMPLANT
SUT MNCRL AB 4-0 PS2 18 (SUTURE) ×2 IMPLANT
SUT PROLENE 5 0 C 1 24 (SUTURE) ×2 IMPLANT
SUT SILK 2 0 PERMA HAND 18 BK (SUTURE) IMPLANT
SUT SILK 2 0 SH (SUTURE) ×2 IMPLANT
SUT SILK 3 0 (SUTURE)
SUT SILK 3-0 18XBRD TIE 12 (SUTURE) IMPLANT
SUT VIC AB 3-0 SH 27 (SUTURE) ×2
SUT VIC AB 3-0 SH 27X BRD (SUTURE) ×2 IMPLANT
SYR 10ML LL (SYRINGE) ×6 IMPLANT
SYR 20ML LL LF (SYRINGE) ×2 IMPLANT
SYR CONTROL 10ML LL (SYRINGE) IMPLANT
SYSTEM TRANSCAROTID NEUROPRTCT (MISCELLANEOUS) ×2 IMPLANT
TOWEL GREEN STERILE (TOWEL DISPOSABLE) ×2 IMPLANT
TRANSCAROTID NEUROPROTECT SYS (MISCELLANEOUS) ×2
WATER STERILE IRR 1000ML POUR (IV SOLUTION) ×2 IMPLANT
WIRE BENTSON .035X145CM (WIRE) ×2 IMPLANT

## 2022-10-13 NOTE — Anesthesia Procedure Notes (Signed)
Procedure Name: Intubation Date/Time: 10/13/2022 7:59 AM  Performed by: Gus Puma, CRNAPre-anesthesia Checklist: Patient identified, Emergency Drugs available, Suction available and Patient being monitored Patient Re-evaluated:Patient Re-evaluated prior to induction Oxygen Delivery Method: Circle system utilized Preoxygenation: Pre-oxygenation with 100% oxygen Induction Type: IV induction Ventilation: Mask ventilation without difficulty Laryngoscope Size: Mac and 4 Grade View: Grade I Tube type: Oral Number of attempts: 1 Airway Equipment and Method: Stylet and Oral airway Placement Confirmation: ETT inserted through vocal cords under direct vision, positive ETCO2 and breath sounds checked- equal and bilateral Secured at: 23 cm Tube secured with: Tape Dental Injury: Teeth and Oropharynx as per pre-operative assessment

## 2022-10-13 NOTE — Op Note (Signed)
NAME: Billy [name]    MRN: [id number] DOB: [birth year]    DATE OF OPERATION: 10/13/2022  PREOP DIAGNOSIS:    Right sided critical asymptomatic carotid artery stenosis   POSTOP DIAGNOSIS:    Same  PROCEDURE:    Right sided transcarotid artery revascularization   SURGEON: Victorino Sparrow  ASSIST: Kayren Eaves, PA  ANESTHESIA: General   EBL: 25ml  INDICATIONS:    Billy [name] is a 72 y.o. male who is status post left-sided carotid endarterectomy for asymptomatic high-grade left ICA stenosis.  He presents today for TCAR of right-sided critical ICA stenosis.  He has a history of left-sided marginal mandibular nerve palsy, otherwise neuro intact.  FINDINGS:    99% stenosis of the right ICA  TECHNIQUE:   The patient was brought to the operating room, where support lines were placed and general anesthesia was secured. The right neck and left groin were prepped and the patient was sterilely draped. A transverse 2-4 cm incision was made between the sternal and clavicular heads of the sternocleidomastoid muscle, below the omohyoid. Following longitudinal division of the carotid sheath the jugular vein was partially dissected and retracted medially. Once 3 cm of common carotid artery (CCA) were isolated, umbilical tape was placed around the proximal 1/3 of the CCA under direct vision. A 5.0 polypropylene suture was pre-placed in the anterior wall of the CCA, in a "U stitch" configuration, close to the clavicle to facilitate hemostasis upon removal of the arterial sheath at completion of the TCAR procedure.  The contralateral (left) common femoral vein (CFV) was accessed under ultrasound guidance, using standard Seldinger and micropuncture access technique. Permanent recorded image(s) was/were saved in the patient's medical record. The Venous Return Sheath was advanced into the CFV over the 0.035" wire provided. Blood was aspirated from the flow line followed by flushing of the Venous Sheath  with heparinized saline. The Venous Sheath was secured to the patient's skin with suture to maintain optimal position in the vessel.  I elected to tunnel due to the steep angle.  Heparin was given to obtain a therapeutic activated clotting time >250 seconds prior to arterial access.  11 blade was used to make a counterincision at the level of the clavicle from this incision a 4-French non-stiffened ENHANCE Transcarotid / Peripheral Access set was used, puncturing the artery with the 21G needle through the pre-placed "U" stitch while holding gentle traction on the umbilical tape to stabilize and centralize the CCA within the incision. Careful attention was paid to the change in CCA shape when using the umbilical tape to control or lift the artery. The micropuncture wire was then advanced 3-4 cm into the CCA and, the 21G needle was removed. The micropuncture sheath was advanced 2-3 cm into the CCA and the wire and dilator were removed. Pulsatile backflow indicated correct positioning. The provided 0.035" J-tipped guidewire was inserted as close as possible to the bifurcation without engaging the lesion. After micropuncture sheath removal, the Transcarotid Arterial Sheath was advanced to the 2.5cm marker and the 0.035" wire and dilator were then removed. Arterial Sheath position was assessed under fluoroscopy in two projections to ensure that the sheath tip was oriented coaxially in the CCA. The Arterial Sheath was sutured to the patient with gentle forward tension. Blood was slowly aspirated followed by flushing with heparinized saline. No ingress of air bubbles through the passive hemostatic valve was observed. The stopcocks were closed. Traction applied to the CCA previously to facilitate access was gently released.  The Flow Controller was connected to the Transcarotid Arterial Sheath, prepared by passively allowing a column of arterial blood to fill the line and connected to the Venous Return Sheath. CCA  inflow was occluded proximal to the arteriotomy with a vascular clamp to achieve active flow reversal. To confirm flow reversal, a saline bolus was delivered into the venous flow line on both "High" and "Low" flow settings of the Flow Controller. Angiograms were performed with slow injections of a small amount of contrast filling just past the lesion to minimize antegrade transmission of micro-bubbles.  Prior to lesion manipulation, heart rate (70bpm) and systolic BP (140-165mmHg) were managed upwards to optimize flow reversal and procedural neuroprotection. The lesion was crossed with an 0.014" ENROUTE guidewire and pre-dilation of the lesion was performed with a 5mm x 30mm rapid exchange 0.014" compatible balloon catheter to 8 atmospheres for 10 seconds. Stenting was performed with a tapering 8-48mm x 40mm ENROUTE Transcarotid stent, sized appropriately to the right CCA. AP and lateral angiograms (gentle contrast injections) were performed to confirm stent placement and arterial wall stent apposition.  At [facility] case completion, antegrade flow was restored by releasing the clamp on the CCA then closing the NPS stopcocks to the flow lines. The Transcarotid Arterial Sheath was removed and the pre-closure suture was tied. Heparin reversal was employed.  The wound bed was irrigated, hemostasis achieved, and closed using Vicryl suture with Monocryl and nylon at the level of the skin  The Venous Return Sheath was removed and hemostasis was achieved with brief manual compression.  The patient tolerated the procedure well and was extubated on the table. The patient was moving all four extremities to command prior to transfer to the recovery room.   Ladonna Snide, MD [facility] DATE OF DICTATION:   10/13/2022

## 2022-10-13 NOTE — Transfer of Care (Signed)
Immediate Anesthesia Transfer of Care Note  Patient: Billy [name]  Procedure(s) Performed: Right Transcarotid Artery Revascularization (Right: Neck) ULTRASOUND GUIDANCE FOR VASCULAR ACCESS, LEFT FEMORAL VEIN (Left: Groin)  Patient Location: PACU  Anesthesia Type:General  Level of Consciousness: drowsy and patient cooperative  Airway & Oxygen Therapy: Patient Spontanous Breathing and Patient connected to nasal cannula oxygen  Post-op Assessment: Report given to RN, Post -op Vital signs reviewed and stable, and Patient moving all extremities X 4  Post vital signs: Reviewed and stable  Last Vitals:  Vitals Value Taken Time  BP 130/59 10/13/22 1002  Temp    Pulse 71 10/13/22 1004  Resp 14 10/13/22 1004  SpO2 94 % 10/13/22 1004  Vitals shown include unvalidated device data.  Last Pain:  Vitals:   10/13/22 0646  PainSc: 0-No pain         Complications: No notable events documented.

## 2022-10-13 NOTE — Anesthesia Postprocedure Evaluation (Signed)
Anesthesia Post Note  Patient: Billy [name]  Procedure(s) Performed: Right Transcarotid Artery Revascularization (Right: Neck) ULTRASOUND GUIDANCE FOR VASCULAR ACCESS, LEFT FEMORAL VEIN (Left: Groin)     Patient location during evaluation: PACU Anesthesia Type: General Level of consciousness: awake and alert, oriented and patient cooperative Pain management: pain level controlled Vital Signs Assessment: post-procedure vital signs reviewed and stable Respiratory status: spontaneous breathing, nonlabored ventilation and respiratory function stable Cardiovascular status: blood pressure returned to baseline and stable Postop Assessment: no apparent nausea or vomiting Anesthetic complications: no   No notable events documented.  Last Vitals:  Vitals:   10/13/22 1100 10/13/22 1115  BP: 137/74 (!) 158/76  Pulse: 65 62  Resp: 10 15  Temp:    SpO2: 96% 96%    Last Pain:  Vitals:   10/13/22 1115  PainSc: 6                  Lannie Fields

## 2022-10-13 NOTE — Discharge Instructions (Signed)
   [facility]  Discharge Instructions   Carotid Surgery  Please refer to the following instructions for your post-procedure care. Your surgeon or physician assistant will discuss any changes with you.  Activity  You are encouraged to walk as much as you can. You can slowly return to normal activities but must avoid strenuous activity and heavy lifting until your doctor tell you it's okay. Avoid activities such as vacuuming or swinging a golf club. You can drive after one week if you are comfortable and you are no longer taking prescription pain medications. It is normal to feel tired for serval weeks after your surgery. It is also normal to have difficulty with sleep habits, eating, and bowel movements after surgery. These will go away with time.  Bathing/Showering  Shower daily after you go home. Do not soak in a bathtub, hot tub, or swim until the incision heals completely.  Incision Care  Shower every day. Clean your incision with mild soap and water. Pat the area dry with a clean towel. You do not need a bandage unless otherwise instructed. Do not apply any ointments or creams to your incision. You may have skin glue on your incision. Do not peel it off. It will come off on its own in about one week. Your incision may feel thickened and raised for several weeks after your surgery. This is normal and the skin will soften over time.   For Men Only: It's okay to shave around the incision but do not shave the incision itself for 2 weeks. It is common to have numbness under your chin that could last for several months.  Diet  Resume your normal diet. There are no special food restrictions following this procedure. A low fat/low cholesterol diet is recommended for all patients with vascular disease. In order to heal from your surgery, it is CRITICAL to get adequate nutrition. Your body requires vitamins, minerals, and protein. Vegetables are the best source of  vitamins and minerals. Vegetables also provide the perfect balance of protein. Processed food has little nutritional value, so try to avoid this.  Medications  Resume taking all of your medications unless your doctor or physician assistant tells you not to. If your incision is causing pain, you may take over-the- counter pain relievers such as acetaminophen (Tylenol). If you were prescribed a stronger pain medication, please be aware these medications can cause nausea and constipation. Prevent nausea by taking the medication with a snack or meal. Avoid constipation by drinking plenty of fluids and eating foods with a high amount of fiber, such as fruits, vegetables, and grains.   Do not take Tylenol if you are taking prescription pain medications.  Follow Up  Our office will schedule a follow up appointment 2-3 weeks following discharge.  Please call us immediately for any of the following conditions  . Increased pain, redness, drainage (pus) from your incision site. . Fever of 101 degrees or higher. . If you should develop stroke (slurred speech, difficulty swallowing, weakness on one side of your body, loss of vision) you should call 911 and go to the nearest emergency room. .  Reduce your risk of vascular disease:  . Stop smoking. If you would like help call [facility] at 1-800-QUIT-NOW (1-800-784-8669) or [place] at 336-586-4000. . Manage your cholesterol . Maintain a desired weight . Control your diabetes . Keep your blood pressure down .  If you have any questions, please call the office at [phone]. 

## 2022-10-13 NOTE — H&P (Addendum)
  POST OPERATIVE OFFICE NOTE    Patient seen and examined in preop holding.  No complaints. No changes to medication history or physical exam since last seen in clinic. After discussing the risks and benefits of right TCAR for asymptomatic carotid disease, Billy [name] elected to proceed.   Victorino Sparrow MD    CC:  F/u for surgery  HPI:  Billy [name] is a 72 y.o. male who is s/p left carotid endarterectomy on 07/28/2022 by Dr. Karin Lieu.  This was done for asymptomatic high-grade left ICA stenosis.  Postoperatively he had some left-sided facial droop without tongue deviation, slurred speech, or other neurological deficits -consistent with marginal mandibular nerve palsy.    On exam today, no was doing well.  He thinks that his marginal mandibular nerve palsy is improving slowly.  He has known high-grade asymptomatic right carotid artery stenosis as well.  The time of his first surgery, we discussed that the right side will also need repair.  Denies recent history of TIA, stroke, amaurosis.  He continues to take aspirin, Plavix, statin.  Neck is well-healed.   Allergies  Allergen Reactions   Amlodipine Swelling    Excessive swelling and skin blotching    Morphine And Codeine Itching and Other (See Comments)    "Cannot handle it"    Current Facility-Administered Medications  Medication Dose Route Frequency Provider Last Rate Last Admin   0.9 %  sodium chloride infusion   Intravenous Continuous Victorino Sparrow, MD       0.9 %  sodium chloride infusion   Intravenous Continuous Lacy Duverney M, DO       ceFAZolin (ANCEF) 2-4 GM/100ML-% IVPB            ceFAZolin (ANCEF) IVPB 2g/100 mL premix  2 g Intravenous 30 min Pre-Op Victorino Sparrow, MD       Chlorhexidine Gluconate Cloth 2 % PADS 6 each  6 each Topical Once Victorino Sparrow, MD       And   Chlorhexidine Gluconate Cloth 2 % PADS 6 each  6 each Topical Once Victorino Sparrow, MD         ROS:  See HPI  Physical  Exam:  Incision:  Left sided neck incision well healed without drainage, erythema, or hematoma Extremities:  palpable radial pulses bilaterally Neuro: intact motor and sensation of upper and lower extremities. No slurred speech, tongue deviation. Mild left lip droop.    Assessment/Plan:  This is a 72 y.o. male who is s/p: left carotid endarterectomy on 07/28/2022  -The patient's left sided neck incision is well healed. There is no signs of infection -He still has some mild left sided lip droop that was present immediately after surgery. I have explained to the patient this can take up to a year to resolve.  Regarding his right-sided critical ICA stenosis -there are 2 lesions present, with the second being right ICA.  This is high, and I do not think it is amenable to carotid endarterectomy.  Furthermore, MELAS management and carotid enterectomy due to his left-sided marginal mandibular nerve palsy.  We had a long discussion regarding carotid endarterectomy versus transcarotid artery revascularization.  After discussing risk and benefits of both, he elected to proceed with transcarotid artery revascularization.  We will schedule this at his convenience.  Asked that he continue taking his aspirin, Plavix, high intensity statin.  Victorino Sparrow MD

## 2022-10-13 NOTE — Anesthesia Procedure Notes (Signed)
Arterial Line Insertion Start/End6/27/2024 7:05 AM, 10/13/2022 7:28 AM Performed by: Lannie Fields, DO, Graeson Nouri, Canary Brim, CRNA, CRNA  Patient location: Pre-op. Preanesthetic checklist: patient identified, IV checked, site marked, risks and benefits discussed, surgical consent, monitors and equipment checked, pre-op evaluation, timeout performed and anesthesia consent Lidocaine 1% used for infiltration and patient sedated Left, radial was placed Catheter size: 20 G Hand hygiene performed  and Seldinger technique used Allen's test indicative of satisfactory collateral circulation Attempts: 3 Procedure performed using ultrasound guided technique. Ultrasound Notes:anatomy identified, needle tip was noted to be adjacent to the nerve/plexus identified and no ultrasound evidence of intravascular and/or intraneural injection Following insertion, dressing applied and Biopatch. Post procedure assessment: normal  Post procedure complications: unsuccessful attempts and second provider assisted. Patient tolerated the procedure well with no immediate complications. Additional procedure comments: Good flashback and flow in arrow catheter with attempts on the right side, but unable to thread guidewire.  Ultrasound utilized for placement on left side -- good flashback and flow noted -- guidewire from arrow catheter easily threaded. Patient given sedation through procedure and local anesthetic administered for patient comfort. Marland Kitchen

## 2022-10-14 ENCOUNTER — Encounter (HOSPITAL_COMMUNITY): Payer: Self-pay | Admitting: Vascular Surgery

## 2022-10-14 LAB — CBC
HCT: 37.3 % — ABNORMAL LOW (ref 39.0–52.0)
Hemoglobin: 12.1 g/dL — ABNORMAL LOW (ref 13.0–17.0)
MCH: 29.4 pg (ref 26.0–34.0)
MCHC: 32.4 g/dL (ref 30.0–36.0)
MCV: 90.8 fL (ref 80.0–100.0)
Platelets: 123 10*3/uL — ABNORMAL LOW (ref 150–400)
RBC: 4.11 MIL/uL — ABNORMAL LOW (ref 4.22–5.81)
RDW: 14 % (ref 11.5–15.5)
WBC: 7.4 10*3/uL (ref 4.0–10.5)
nRBC: 0 % (ref 0.0–0.2)

## 2022-10-14 LAB — LIPID PANEL
Cholesterol: 103 mg/dL (ref 0–200)
HDL: 37 mg/dL — ABNORMAL LOW (ref >40)
LDL Cholesterol: 50 mg/dL (ref 0–99)
Total CHOL/HDL Ratio: 2.8 RATIO
Triglycerides: 81 mg/dL (ref <150)
VLDL: 16 mg/dL (ref 0–40)

## 2022-10-14 LAB — BASIC METABOLIC PANEL
Anion gap: 13 (ref 5–15)
BUN: 34 mg/dL — ABNORMAL HIGH (ref 8–23)
CO2: 16 mmol/L — ABNORMAL LOW (ref 22–32)
Calcium: 8.6 mg/dL — ABNORMAL LOW (ref 8.9–10.3)
Chloride: 110 mmol/L (ref 98–111)
Creatinine, Ser: 2.09 mg/dL — ABNORMAL HIGH (ref 0.61–1.24)
GFR, Estimated: 34 mL/min — ABNORMAL LOW (ref >60)
Glucose, Bld: 111 mg/dL — ABNORMAL HIGH (ref 70–99)
Potassium: 4.1 mmol/L (ref 3.5–5.1)
Sodium: 139 mmol/L (ref 135–145)

## 2022-10-14 MED ORDER — ATORVASTATIN CALCIUM 80 MG PO TABS: 80.0000 mg | ORAL_TABLET | Freq: Every day | ORAL | 0 refills | Status: DC

## 2022-10-14 MED ORDER — OXYCODONE HCL 5 MG PO TABS: 5.0000 mg | ORAL_TABLET | Freq: Four times a day (QID) | ORAL | 0 refills | Status: DC | PRN

## 2022-10-14 NOTE — Progress Notes (Addendum)
  Progress Note    10/14/2022 7:49 AM 1 Day Post-Op  Subjective:  feeling fine this morning. Ate some pudding last night without issue. Has stood up to urinate but hasn't walked around yet    Vitals:   10/14/22 0435 10/14/22 0600  BP: (!) 170/74 (!) 155/89  Pulse: 98 69  Resp: 16 12  Temp: 98 F (36.7 C)   SpO2: 99% 98%    Physical Exam: General:  resting comfortably Cardiac:  regular Lungs:  nonlabored Incisions:  r sided neck incisions c/d/l without hematoma  Extremities:  moving all extremities equally, palpable radial pulses Neuro: intact. No slurred speech, facial droop, or tongue deviation   CBC    Component Value Date/Time   WBC 7.4 10/14/2022 0429   RBC 4.11 (L) 10/14/2022 0429   HGB 12.1 (L) 10/14/2022 0429   HCT 37.3 (L) 10/14/2022 0429   PLT 123 (L) 10/14/2022 0429   MCV 90.8 10/14/2022 0429   MCH 29.4 10/14/2022 0429   MCHC 32.4 10/14/2022 0429   RDW 14.0 10/14/2022 0429   LYMPHSABS 1.1 08/31/2019 1120   MONOABS 0.6 08/31/2019 1120   EOSABS 0.2 08/31/2019 1120   BASOSABS 0.1 08/31/2019 1120    BMET    Component Value Date/Time   NA 139 10/14/2022 0429   NA 144 12/18/2015 0904   K 4.1 10/14/2022 0429   CL 110 10/14/2022 0429   CO2 16 (L) 10/14/2022 0429   GLUCOSE 111 (H) 10/14/2022 0429   BUN 34 (H) 10/14/2022 0429   BUN 33 (H) 12/18/2015 0904   CREATININE 2.09 (H) 10/14/2022 0429   CALCIUM 8.6 (L) 10/14/2022 0429   GFRNONAA 34 (L) 10/14/2022 0429   GFRAA 29 (L) 08/31/2019 1120    INR    Component Value Date/Time   INR 1.2 10/04/2022 1050     Intake/Output Summary (Last 24 hours) at 10/14/2022 0749 Last data filed at 10/14/2022 0216 Gross per 24 hour  Intake 1600 ml  Output 600 ml  Net 1000 ml      Assessment/Plan:  72 y.o. male is 1 day post op, s/p: R TCAR   -He is doing well this morning with a little bit of pain at his incision sites -His right sided neck incision is intact and dry without hematoma -He is neuro intact  without facial droop, slurred speech, or tongue deviation. He also ate pudding last night without difficulty swallowing -Hgb stable at 12.1 and Cr at baseline at 2.09 -Ready for discharge home today. He will follow up with our office in 3-4 wks with carotid duplex. Continue ASA, plavix, and statin   Loel Dubonnet, PA-C [facility] [phone] 10/14/2022 7:49 AM  VASCULAR STAFF ADDENDUM: I have independently interviewed and examined the patient. I agree with the above.    Fara Olden, MD [facility] Phone Number: [phone] 10/14/2022 11:12 AM

## 2022-10-14 NOTE — TOC Transition Note (Signed)
Transition of Care (TOC) - CM/SW Discharge Note Donn Pierini RN, BSN Transitions of Care Unit 4E- RN Case Manager See Treatment Team for direct phone #   Patient Details  Name: Bassel Rudisill MRN: [id number] Date of Birth: Oct 29, 1950  Transition of Care [facility]) CM/SW Contact:  Darrold Span, RN Phone Number: 10/14/2022, 11:29 AM   Clinical Narrative:    Pt stable for transition home today,   Transition of Care (TOC) - Inpatient Brief Assessment   Transition of Care Asessment: Insurance and Status: Insurance coverage has been reviewed Patient has primary care physician: Yes Home environment has been reviewed: home Prior level of function:: independent Prior/Current Home Services: No current home services Social Determinants of Health Reivew: SDOH reviewed no interventions necessary Readmission risk has been reviewed: Yes Transition of care needs: no transition of care needs at this time      Final next level of care: Home/Self Care Barriers to Discharge: No Barriers Identified   Patient Goals and CMS Choice   Choice offered to / list presented to : NA  Discharge Placement                 Home        Discharge Plan and Services Additional resources added to the After Visit Summary for                  DME Arranged: N/A DME Agency: NA         HH Agency: NA        Social Determinants of Health (SDOH) Interventions SDOH Screenings   Depression (PHQ2-9): Low Risk  (12/01/2020)  Tobacco Use: Low Risk  (10/14/2022)     Readmission Risk Interventions    10/14/2022   11:29 AM  Readmission Risk Prevention Plan  Post Dischage Appt Complete  Medication Screening Complete  Transportation Screening Complete

## 2022-10-18 ENCOUNTER — Telehealth: Payer: Self-pay | Admitting: Physician Assistant
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
## 2022-10-18 NOTE — Telephone Encounter (Signed)
-----   Message from [facility] [place] sent at 10/14/2022  8:09 AM EDT ----- S/p R TCAR 6/27  Please arrange follow up in 3-4 weeks with carotid duplex with either Karin Lieu or PA

## 2022-10-28 ENCOUNTER — Other Ambulatory Visit: Payer: Self-pay | Admitting: *Deleted

## 2022-10-28 DIAGNOSIS — I6523 Occlusion and stenosis of bilateral carotid arteries: Secondary | ICD-10-CM

## 2022-10-28 DIAGNOSIS — I6521 Occlusion and stenosis of right carotid artery: Secondary | ICD-10-CM

## 2022-11-14 ENCOUNTER — Ambulatory Visit (HOSPITAL_COMMUNITY): Payer: PPO

## 2022-11-16 ENCOUNTER — Ambulatory Visit (HOSPITAL_COMMUNITY)
Admission: RE | Admit: 2022-11-16 | Discharge: 2022-11-16 | Disposition: A | Discharge status: Home / Self Care | Payer: PPO | Source: Ambulatory Visit | Attending: Vascular Surgery | Admitting: Vascular Surgery

## 2022-11-16 DIAGNOSIS — I6521 Occlusion and stenosis of right carotid artery: Secondary | ICD-10-CM | POA: Insufficient documentation

## 2022-11-16 DIAGNOSIS — I6523 Occlusion and stenosis of bilateral carotid arteries: Secondary | ICD-10-CM | POA: Insufficient documentation

## 2022-11-18 ENCOUNTER — Ambulatory Visit (INDEPENDENT_AMBULATORY_CARE_PROVIDER_SITE_OTHER): Payer: PPO | Admitting: Vascular Surgery

## 2022-11-18 ENCOUNTER — Encounter: Payer: Self-pay | Admitting: Vascular Surgery

## 2022-11-18 VITALS — BP 142/71 | HR 71 | Temp 97.9°F | Resp 20 | Ht 74.0 in | Wt 198.0 lb

## 2022-11-18 DIAGNOSIS — Z9889 Other specified postprocedural states: Secondary | ICD-10-CM

## 2022-11-18 DIAGNOSIS — Z9862 Peripheral vascular angioplasty status: Secondary | ICD-10-CM

## 2022-11-18 NOTE — Progress Notes (Unsigned)
POST OPERATIVE OFFICE NOTE    CC:  F/u for surgery  HPI:  Billy [name] is a 72 y.o. male who is s/p Right TCAR 10/13/22, left carotid endarterectomy on 07/28/2022 by Dr. Karin Lieu for bilateral asymptomatic high-grade ICA stenosis.  Postoperatively he had some left-sided facial droop without tongue deviation, slurred speech, or other neurological deficits -consistent with marginal mandibular nerve palsy.    Presents today status post TCAR, doing well.  He denies symptoms of TIA, stroke, amaurosis.  No issues with wound healing. Continues to be compliant with his aspirin, Plavix, statin.  Allergies  Allergen Reactions   Amlodipine Swelling    Excessive swelling and skin blotching    Morphine And Codeine Itching and Other (See Comments)    "Cannot handle it"    Current Outpatient Medications  Medication Sig Dispense Refill   aspirin EC 81 MG tablet Take 1 tablet (81 mg total) by mouth daily. Swallow whole. 30 tablet 12   atorvastatin (LIPITOR) 80 MG tablet Take 1 tablet (80 mg total) by mouth daily with supper. 30 tablet 0   carvedilol (COREG) 25 MG tablet Take 25 mg by mouth 2 (two) times daily.      clopidogrel (PLAVIX) 75 MG tablet Take 1 tablet (75 mg total) by mouth daily. 30 tablet 6   ezetimibe (ZETIA) 10 MG tablet Take 1 tablet (10 mg total) by mouth daily. (Patient taking differently: Take 10 mg by mouth daily with supper.) 30 tablet 0   hydrALAZINE (APRESOLINE) 100 MG tablet Take 100 mg by mouth 3 (three) times daily.     isosorbide mononitrate (IMDUR) 30 MG 24 hr tablet TAKE 1 TABLET BY MOUTH EVERY DAY 90 tablet 3   oxyCODONE (ROXICODONE) 5 MG immediate release tablet Take 1 tablet (5 mg total) by mouth every 6 (six) hours as needed for severe pain. 10 tablet 0   No current facility-administered medications for this visit.     ROS:  See HPI  Physical Exam:  Incision:  Left sided neck incision well healed without drainage, erythema, or hematoma Extremities:  palpable radial  pulses bilaterally Neuro: intact motor and sensation of upper and lower extremities. No slurred speech, tongue deviation. Mild left lip droop.   Right Carotid Findings:  +----------+--------+--------+--------+------------------+--------+           PSV cm/sEDV cm/sStenosisPlaque DescriptionComments  +----------+--------+--------+--------+------------------+--------+  CCA Prox  71      12                                          +----------+--------+--------+--------+------------------+--------+  CCA Distal51      12                                          +----------+--------+--------+--------+------------------+--------+  ICA Prox                                            stent     +----------+--------+--------+--------+------------------+--------+  ICA Mid  stent     +----------+--------+--------+--------+------------------+--------+  ICA Distal54      17                                          +----------+--------+--------+--------+------------------+--------+  ECA      321     19      >50%                                +----------+--------+--------+--------+------------------+--------+   +----------+--------+-------+----------------+-------------------+           PSV cm/sEDV cmsDescribe        Arm Pressure (mmHG)  +----------+--------+-------+----------------+-------------------+  [id number]           Multiphasic, [id number]                  +----------+--------+-------+----------------+-------------------+   +---------+--------+--+--------+--+---------+  VertebralPSV cm/s44EDV cm/s11Antegrade  +---------+--------+--+--------+--+---------+      Right Stent(s):  +---------------+--------+--------+--------+--------+--------+  ICA           PSV cm/sEDV cm/sStenosisWaveformComments  +---------------+--------+--------+--------+--------+--------+  Prox to Stent   51      12                                +---------------+--------+--------+--------+--------+--------+  Proximal Stent 59      13                                +---------------+--------+--------+--------+--------+--------+  Mid Stent      58      17                                +---------------+--------+--------+--------+--------+--------+  Distal Stent   58      18                                +---------------+--------+--------+--------+--------+--------+  Distal to Stent54      17                                +---------------+--------+--------+--------+--------+--------+          Left Carotid Findings:  +----------+--------+--------+--------+--------------------------+--------+            PSV cm/sEDV cm/sStenosisPlaque Description         Comments  +----------+--------+--------+--------+--------------------------+--------+   CCA Prox  90      21                                                   +----------+--------+--------+--------+--------------------------+--------+   CCA Distal50      11              irregular and heterogenous           +----------+--------+--------+--------+--------------------------+--------+   ICA Prox  60      19              smooth and heterogenous              +----------+--------+--------+--------+--------------------------+--------+  ICA Mid   84      28                                                   +----------+--------+--------+--------+--------------------------+--------+   ICA Distal84      34                                                   +----------+--------+--------+--------+--------------------------+--------+   ECA      107     11                                                   +----------+--------+--------+--------+--------------------------+--------+    +----------+--------+--------+----------------+-------------------+            PSV cm/sEDV cm/sDescribe        Arm Pressure (mmHG)  +----------+--------+--------+----------------+-------------------+  [id number]            Multiphasic, [id number]                  +----------+--------+--------+----------------+-------------------+   +---------+--------+--+--------+--+---------+  VertebralPSV cm/s58EDV cm/s14Antegrade  +---------+--------+--+--------+--+---------+    Assessment/Plan:  This is a 72 y.o. male who is s/p: left carotid endarterectomy on 07/28/2022, Right TCAR.  Right-sided TCAR incision is healed nicely. Marginal mandibular nerve palsy improving. Bilateral carotid duplex ultrasound demonstrates widely patent carotids bilaterally.   Will see again in 9 months.  Asked that he continue his current medication regimen.   Victorino Sparrow MD

## 2022-11-25 ENCOUNTER — Other Ambulatory Visit: Payer: Self-pay

## 2022-11-25 DIAGNOSIS — I6523 Occlusion and stenosis of bilateral carotid arteries: Secondary | ICD-10-CM

## 2022-12-15 DIAGNOSIS — N184 Chronic kidney disease, stage 4 (severe): Secondary | ICD-10-CM | POA: Diagnosis not present

## 2022-12-15 DIAGNOSIS — G629 Polyneuropathy, unspecified: Secondary | ICD-10-CM | POA: Diagnosis not present

## 2022-12-15 DIAGNOSIS — Z8673 Personal history of transient ischemic attack (TIA), and cerebral infarction without residual deficits: Secondary | ICD-10-CM | POA: Diagnosis not present

## 2022-12-15 DIAGNOSIS — Z0000 Encounter for general adult medical examination without abnormal findings: Secondary | ICD-10-CM | POA: Diagnosis not present

## 2022-12-15 DIAGNOSIS — I130 Hypertensive heart and chronic kidney disease with heart failure and stage 1 through stage 4 chronic kidney disease, or unspecified chronic kidney disease: Secondary | ICD-10-CM | POA: Diagnosis not present

## 2022-12-15 DIAGNOSIS — I700 Atherosclerosis of aorta: Secondary | ICD-10-CM | POA: Diagnosis not present

## 2022-12-15 DIAGNOSIS — I480 Paroxysmal atrial fibrillation: Secondary | ICD-10-CM | POA: Diagnosis not present

## 2022-12-15 DIAGNOSIS — Z1331 Encounter for screening for depression: Secondary | ICD-10-CM | POA: Diagnosis not present

## 2022-12-15 DIAGNOSIS — E7800 Pure hypercholesterolemia, unspecified: Secondary | ICD-10-CM | POA: Diagnosis not present

## 2022-12-15 DIAGNOSIS — Z1159 Encounter for screening for other viral diseases: Secondary | ICD-10-CM | POA: Diagnosis not present

## 2022-12-15 DIAGNOSIS — Z125 Encounter for screening for malignant neoplasm of prostate: Secondary | ICD-10-CM | POA: Diagnosis not present

## 2022-12-15 DIAGNOSIS — G2581 Restless legs syndrome: Secondary | ICD-10-CM | POA: Diagnosis not present

## 2022-12-15 DIAGNOSIS — I252 Old myocardial infarction: Secondary | ICD-10-CM | POA: Diagnosis not present

## 2022-12-15 DIAGNOSIS — R609 Edema, unspecified: Secondary | ICD-10-CM | POA: Diagnosis not present

## 2023-01-01 ENCOUNTER — Other Ambulatory Visit: Payer: Self-pay | Admitting: Vascular Surgery

## 2023-01-04 DIAGNOSIS — I5022 Chronic systolic (congestive) heart failure: Secondary | ICD-10-CM | POA: Diagnosis not present

## 2023-01-04 DIAGNOSIS — N184 Chronic kidney disease, stage 4 (severe): Secondary | ICD-10-CM | POA: Diagnosis not present

## 2023-01-04 DIAGNOSIS — I701 Atherosclerosis of renal artery: Secondary | ICD-10-CM | POA: Diagnosis not present

## 2023-01-04 DIAGNOSIS — Z86718 Personal history of other venous thrombosis and embolism: Secondary | ICD-10-CM | POA: Diagnosis not present

## 2023-01-04 DIAGNOSIS — I129 Hypertensive chronic kidney disease with stage 1 through stage 4 chronic kidney disease, or unspecified chronic kidney disease: Secondary | ICD-10-CM | POA: Diagnosis not present

## 2023-01-04 DIAGNOSIS — I4891 Unspecified atrial fibrillation: Secondary | ICD-10-CM | POA: Diagnosis not present

## 2023-01-05 LAB — LAB REPORT - SCANNED
Albumin, Urine POC: 86.7
EGFR: 24
Microalb Creat Ratio: 43

## 2023-02-07 DIAGNOSIS — R351 Nocturia: Secondary | ICD-10-CM | POA: Diagnosis not present

## 2023-02-07 DIAGNOSIS — R972 Elevated prostate specific antigen [PSA]: Secondary | ICD-10-CM | POA: Diagnosis not present

## 2023-02-07 DIAGNOSIS — N401 Enlarged prostate with lower urinary tract symptoms: Secondary | ICD-10-CM | POA: Diagnosis not present

## 2023-03-06 DIAGNOSIS — T485X5A Adverse effect of other anti-common-cold drugs, initial encounter: Secondary | ICD-10-CM | POA: Diagnosis not present

## 2023-03-06 DIAGNOSIS — J310 Chronic rhinitis: Secondary | ICD-10-CM | POA: Diagnosis not present

## 2023-03-09 ENCOUNTER — Encounter (HOSPITAL_COMMUNITY): Payer: Self-pay

## 2023-03-09 ENCOUNTER — Other Ambulatory Visit: Payer: Self-pay

## 2023-03-09 ENCOUNTER — Inpatient Hospital Stay (HOSPITAL_COMMUNITY)
Admission: EM | Admit: 2023-03-09 | Discharge: 2023-03-14 | DRG: 291 | Disposition: A | Discharge status: Home Health | Payer: PPO | Attending: Internal Medicine | Admitting: Internal Medicine

## 2023-03-09 ENCOUNTER — Emergency Department (HOSPITAL_COMMUNITY): Payer: PPO

## 2023-03-09 DIAGNOSIS — I493 Ventricular premature depolarization: Secondary | ICD-10-CM | POA: Diagnosis not present

## 2023-03-09 DIAGNOSIS — Z91148 Patient's other noncompliance with medication regimen for other reason: Secondary | ICD-10-CM

## 2023-03-09 DIAGNOSIS — R778 Other specified abnormalities of plasma proteins: Secondary | ICD-10-CM | POA: Diagnosis not present

## 2023-03-09 DIAGNOSIS — K567 Ileus, unspecified: Secondary | ICD-10-CM | POA: Diagnosis present

## 2023-03-09 DIAGNOSIS — R7989 Other specified abnormal findings of blood chemistry: Secondary | ICD-10-CM | POA: Diagnosis not present

## 2023-03-09 DIAGNOSIS — I252 Old myocardial infarction: Secondary | ICD-10-CM

## 2023-03-09 DIAGNOSIS — I2510 Atherosclerotic heart disease of native coronary artery without angina pectoris: Secondary | ICD-10-CM | POA: Diagnosis present

## 2023-03-09 DIAGNOSIS — Y92239 Unspecified place in hospital as the place of occurrence of the external cause: Secondary | ICD-10-CM | POA: Diagnosis present

## 2023-03-09 DIAGNOSIS — I480 Paroxysmal atrial fibrillation: Secondary | ICD-10-CM | POA: Diagnosis present

## 2023-03-09 DIAGNOSIS — R0609 Other forms of dyspnea: Secondary | ICD-10-CM | POA: Diagnosis not present

## 2023-03-09 DIAGNOSIS — I779 Disorder of arteries and arterioles, unspecified: Secondary | ICD-10-CM | POA: Diagnosis present

## 2023-03-09 DIAGNOSIS — Z8673 Personal history of transient ischemic attack (TIA), and cerebral infarction without residual deficits: Secondary | ICD-10-CM

## 2023-03-09 DIAGNOSIS — I358 Other nonrheumatic aortic valve disorders: Secondary | ICD-10-CM | POA: Diagnosis present

## 2023-03-09 DIAGNOSIS — I509 Heart failure, unspecified: Secondary | ICD-10-CM

## 2023-03-09 DIAGNOSIS — I110 Hypertensive heart disease with heart failure: Secondary | ICD-10-CM | POA: Diagnosis not present

## 2023-03-09 DIAGNOSIS — I5033 Acute on chronic diastolic (congestive) heart failure: Secondary | ICD-10-CM | POA: Insufficient documentation

## 2023-03-09 DIAGNOSIS — R1012 Left upper quadrant pain: Secondary | ICD-10-CM | POA: Diagnosis not present

## 2023-03-09 DIAGNOSIS — R918 Other nonspecific abnormal finding of lung field: Secondary | ICD-10-CM | POA: Diagnosis not present

## 2023-03-09 DIAGNOSIS — Z7902 Long term (current) use of antithrombotics/antiplatelets: Secondary | ICD-10-CM

## 2023-03-09 DIAGNOSIS — K56600 Partial intestinal obstruction, unspecified as to cause: Secondary | ICD-10-CM | POA: Diagnosis present

## 2023-03-09 DIAGNOSIS — Z86718 Personal history of other venous thrombosis and embolism: Secondary | ICD-10-CM

## 2023-03-09 DIAGNOSIS — R0981 Nasal congestion: Secondary | ICD-10-CM | POA: Diagnosis not present

## 2023-03-09 DIAGNOSIS — Z83438 Family history of other disorder of lipoprotein metabolism and other lipidemia: Secondary | ICD-10-CM

## 2023-03-09 DIAGNOSIS — I130 Hypertensive heart and chronic kidney disease with heart failure and stage 1 through stage 4 chronic kidney disease, or unspecified chronic kidney disease: Secondary | ICD-10-CM | POA: Diagnosis not present

## 2023-03-09 DIAGNOSIS — J90 Pleural effusion, not elsewhere classified: Secondary | ICD-10-CM | POA: Diagnosis not present

## 2023-03-09 DIAGNOSIS — I959 Hypotension, unspecified: Secondary | ICD-10-CM | POA: Diagnosis present

## 2023-03-09 DIAGNOSIS — Z833 Family history of diabetes mellitus: Secondary | ICD-10-CM

## 2023-03-09 DIAGNOSIS — I5A Non-ischemic myocardial injury (non-traumatic): Secondary | ICD-10-CM | POA: Diagnosis present

## 2023-03-09 DIAGNOSIS — R0602 Shortness of breath: Secondary | ICD-10-CM | POA: Diagnosis not present

## 2023-03-09 DIAGNOSIS — I700 Atherosclerosis of aorta: Secondary | ICD-10-CM | POA: Diagnosis not present

## 2023-03-09 DIAGNOSIS — Z885 Allergy status to narcotic agent status: Secondary | ICD-10-CM

## 2023-03-09 DIAGNOSIS — Z91128 Patient's intentional underdosing of medication regimen for other reason: Secondary | ICD-10-CM

## 2023-03-09 DIAGNOSIS — Z888 Allergy status to other drugs, medicaments and biological substances status: Secondary | ICD-10-CM

## 2023-03-09 DIAGNOSIS — I10 Essential (primary) hypertension: Secondary | ICD-10-CM | POA: Diagnosis not present

## 2023-03-09 DIAGNOSIS — Z1152 Encounter for screening for COVID-19: Secondary | ICD-10-CM

## 2023-03-09 DIAGNOSIS — E7800 Pure hypercholesterolemia, unspecified: Secondary | ICD-10-CM | POA: Diagnosis present

## 2023-03-09 DIAGNOSIS — E785 Hyperlipidemia, unspecified: Secondary | ICD-10-CM | POA: Diagnosis present

## 2023-03-09 DIAGNOSIS — R051 Acute cough: Secondary | ICD-10-CM | POA: Diagnosis not present

## 2023-03-09 DIAGNOSIS — T501X6A Underdosing of loop [high-ceiling] diuretics, initial encounter: Secondary | ICD-10-CM | POA: Diagnosis present

## 2023-03-09 DIAGNOSIS — N1832 Chronic kidney disease, stage 3b: Secondary | ICD-10-CM | POA: Diagnosis present

## 2023-03-09 DIAGNOSIS — K2970 Gastritis, unspecified, without bleeding: Secondary | ICD-10-CM

## 2023-03-09 DIAGNOSIS — Z7982 Long term (current) use of aspirin: Secondary | ICD-10-CM

## 2023-03-09 DIAGNOSIS — R0989 Other specified symptoms and signs involving the circulatory and respiratory systems: Secondary | ICD-10-CM | POA: Diagnosis not present

## 2023-03-09 DIAGNOSIS — N183 Chronic kidney disease, stage 3 unspecified: Secondary | ICD-10-CM | POA: Diagnosis present

## 2023-03-09 DIAGNOSIS — Z79899 Other long term (current) drug therapy: Secondary | ICD-10-CM

## 2023-03-09 DIAGNOSIS — Z8249 Family history of ischemic heart disease and other diseases of the circulatory system: Secondary | ICD-10-CM

## 2023-03-09 LAB — RESP PANEL BY RT-PCR (RSV, FLU A&B, COVID)  RVPGX2
Influenza A by PCR: NEGATIVE
Influenza B by PCR: NEGATIVE
Resp Syncytial Virus by PCR: NEGATIVE
SARS Coronavirus 2 by RT PCR: NEGATIVE

## 2023-03-09 LAB — BASIC METABOLIC PANEL
Anion gap: 14 (ref 5–15)
BUN: 18 mg/dL (ref 8–23)
CO2: 16 mmol/L — ABNORMAL LOW (ref 22–32)
Calcium: 9.1 mg/dL (ref 8.9–10.3)
Chloride: 111 mmol/L (ref 98–111)
Creatinine, Ser: 1.85 mg/dL — ABNORMAL HIGH (ref 0.61–1.24)
GFR, Estimated: 39 mL/min — ABNORMAL LOW (ref >60)
Glucose, Bld: 87 mg/dL (ref 70–99)
Potassium: 3.5 mmol/L (ref 3.5–5.1)
Sodium: 141 mmol/L (ref 135–145)

## 2023-03-09 LAB — TROPONIN I (HIGH SENSITIVITY)
Troponin I (High Sensitivity): 53 ng/L — ABNORMAL HIGH (ref <18)
Troponin I (High Sensitivity): 63 ng/L — ABNORMAL HIGH (ref <18)

## 2023-03-09 LAB — BRAIN NATRIURETIC PEPTIDE: B Natriuretic Peptide: 660.9 pg/mL — ABNORMAL HIGH (ref 0.0–100.0)

## 2023-03-09 MED ORDER — ALBUTEROL SULFATE HFA 108 (90 BASE) MCG/ACT IN AERS: 2.0000 | INHALATION_SPRAY | RESPIRATORY_TRACT | Status: DC | PRN

## 2023-03-09 MED ORDER — FUROSEMIDE 20 MG PO TABS: 40.0000 mg | ORAL_TABLET | Freq: Every day | ORAL | 0 refills | Status: DC

## 2023-03-09 MED ORDER — FUROSEMIDE 10 MG/ML IJ SOLN
40.0000 mg | Freq: Once | INTRAMUSCULAR | Status: AC
  Administered 2023-03-09: 40 mg via INTRAVENOUS
  Filled 2023-03-09: qty 4

## 2023-03-09 NOTE — Discharge Instructions (Signed)
Follow-up with your doctor for recheck of some of the fluid on the lungs.  Take the Lasix as directed for the next 5 days.  Also need your kidney function rechecked.

## 2023-03-09 NOTE — ED Notes (Signed)
Help get patient into a gown on the monitor did EKG shown to Dr gray patient is resting with call bell in reach

## 2023-03-09 NOTE — ED Triage Notes (Signed)
PT BIBGEMS from home after developing increased SOB, Sob for a couple of days. Told by MD today after getting a CXR that he has a plural effusion and to be treated at the hospital.   202/98 92 hr 98%  RA 20 rr  GCS 15

## 2023-03-09 NOTE — ED Notes (Signed)
This Rn and a second Rn attempted to receive blood with no success, asked lab tech to try.

## 2023-03-09 NOTE — ED Provider Notes (Signed)
Patient's basic metabolic panel creatinine 1.85 GFR 39.  Initial troponin was 50 3 repeat was 63 so a change of 10.  BNP 660.  Patient's respiratory panel negative.  Chest x-ray findings were suggestive of pulmonary vascular congestion with small left greater than right pleural effusions.  Patient's oxygen sats are good.  Patient feels better after the Lasix and has diuresed expectedly.  Patient was given 40 g of Lasix IV.  I feel that most of his problem is probably been a little bit of mild CHF.  Based on his BNP.  Oxygen sats are good.  But the 2 troponins changed at 53-63 so changed to 10.  So ordered a third.  If the third is negative patient can probably be discharged home with Lasix to take at home and follow-up with his doctors.   Vanetta Mulders, MD 03/09/23 [phone]

## 2023-03-09 NOTE — ED Provider Notes (Signed)
Billy [name] EMERGENCY DEPARTMENT AT [facility] Provider Note   CSN: [id number] Arrival date & time: 03/09/23  1341     History  Chief Complaint  Patient presents with   Shortness of Breath    Billy [name] is a 72 y.o. male.  Patient is a 72 year old male with past medical history of  hypertension, hyperlipidemia, myocardial infarction, CKD stage III, and A-fib presenting for complaints of shortness of breath.  Patient admits to exertional shortness of breath worse by lying flat or bending over.  He admits to bilateral lower extremity swelling.  He is supposed to be taking furosemide but discontinued because he did not like the urinary frequency.  He denies fevers, chills, coughing.  Denies chest pain.  The history is provided by the patient. No language interpreter was used.  Shortness of Breath Associated symptoms: no abdominal pain, no chest pain, no cough, no ear pain, no fever, no rash, no sore throat and no vomiting        Home Medications Prior to Admission medications   Medication Sig Start Date End Date Taking? Authorizing Provider  aspirin EC 81 MG tablet Take 1 tablet (81 mg total) by mouth daily. Swallow whole. 07/29/22   Lars Mage, PA-C  atorvastatin (LIPITOR) 80 MG tablet Take 1 tablet (80 mg total) by mouth daily with supper. 10/14/22   Schuh, McKenzi P, PA-C  carvedilol (COREG) 25 MG tablet Take 25 mg by mouth 2 (two) times daily.     [provider]  clopidogrel (PLAVIX) 75 MG tablet TAKE 1 TABLET BY MOUTH EVERY DAY 01/03/23   Victorino Sparrow, MD  ezetimibe (ZETIA) 10 MG tablet Take 1 tablet (10 mg total) by mouth daily. Patient taking differently: Take 10 mg by mouth daily with supper. 09/01/22   Victorino Sparrow, MD  hydrALAZINE (APRESOLINE) 100 MG tablet Take 100 mg by mouth 3 (three) times daily.    [provider]  isosorbide mononitrate (IMDUR) 30 MG 24 hr tablet TAKE 1 TABLET BY MOUTH EVERY DAY 02/21/22   Runell Gess, MD       Allergies    Amlodipine and Morphine and codeine    Review of Systems   Review of Systems  Constitutional:  Negative for chills and fever.  HENT:  Negative for ear pain and sore throat.   Eyes:  Negative for pain and visual disturbance.  Respiratory:  Positive for shortness of breath. Negative for cough.   Cardiovascular:  Positive for leg swelling. Negative for chest pain and palpitations.  Gastrointestinal:  Negative for abdominal pain and vomiting.  Genitourinary:  Negative for dysuria and hematuria.  Musculoskeletal:  Negative for arthralgias and back pain.  Skin:  Negative for color change and rash.  Neurological:  Negative for seizures and syncope.  All other systems reviewed and are negative.   Physical Exam Updated Vital Signs BP (!) 170/75 (BP Location: Right Arm)   Pulse 76   Temp 98.3 F (36.8 C) (Oral)   Resp 18   Ht 6\' 2"  (1.88 m)   Wt 90 kg   SpO2 97%   BMI 25.47 kg/m  Physical Exam Vitals and nursing note reviewed.  Constitutional:      General: He is not in acute distress.    Appearance: He is well-developed.  HENT:     Head: Normocephalic and atraumatic.  Eyes:     Conjunctiva/sclera: Conjunctivae normal.  Cardiovascular:     Rate and Rhythm: Normal rate and regular  rhythm.     Heart sounds: No murmur heard. Pulmonary:     Effort: Pulmonary effort is normal. Tachypnea present. No respiratory distress.     Breath sounds: Normal breath sounds.  Abdominal:     Palpations: Abdomen is soft.     Tenderness: There is no abdominal tenderness.  Musculoskeletal:        General: No swelling.     Cervical back: Neck supple.     Right lower leg: 3+ Pitting Edema present.     Left lower leg: 3+ Pitting Edema present.  Skin:    General: Skin is warm and dry.     Capillary Refill: Capillary refill takes less than 2 seconds.  Neurological:     Mental Status: He is alert.  Psychiatric:        Mood and Affect: Mood normal.     ED Results / Procedures  / Treatments   Labs (all labs ordered are listed, but only abnormal results are displayed) Labs Reviewed  RESP PANEL BY RT-PCR (RSV, FLU A&B, COVID)  [id number]  CBC WITH DIFFERENTIAL/PLATELET  BRAIN NATRIURETIC PEPTIDE  BASIC METABOLIC PANEL  TROPONIN I (HIGH SENSITIVITY)    EKG None  Radiology No results found.  Procedures Procedures    Medications Ordered in ED Medications  albuterol (VENTOLIN HFA) 108 (90 Base) MCG/ACT inhaler 2 puff (has no administration in time range)    ED Course/ Medical Decision Making/ A&P Clinical Course as of 03/10/23 0959  Fri Mar 10, 2023  1610 Spoke with Dr. Lazarus Salines from Hospitalist service who will have AM team admit. [PC]    Clinical Course User Index [PC] Cardama, Amadeo Garnet, MD                                 Medical Decision Making Amount and/or Complexity of Data Reviewed Labs: ordered. Radiology: ordered.  Risk Prescription drug management. Decision regarding hospitalization.   75:55 PM 72 year old male with past medical history of  hypertension, hyperlipidemia, myocardial infarction, CKD stage III, and A-fib presenting for complaints of shortness of breath.  Patient is alert and oriented x 3, no acute distress, afebrile, stable vital signs.  Tachypnea on exam without hypoxia.  +3 pitting edema bilateral lower extremities.  Clear breath sounds.  Concerns for acute CHF exacerbation.  Chart review demonstrates a cardiogram in May 2024 mistreating EF of 65 to 70% with normal left ventricle function.  Normal right systolic function.  Mild dilated left and right atrium's.  And mild aortic valve calcifications. Lasix 40 mg IV ordered.  Blood pressure stable at 170/75 prior to Lasix.  Labs and chest x-ray pending.  Patient signed out to oncoming provider at shift change.        Final Clinical Impression(s) / ED Diagnoses Final diagnoses:  None    Rx / DC Orders ED Discharge Orders     None         Franne Forts, DO 03/10/23 1003

## 2023-03-10 ENCOUNTER — Emergency Department (HOSPITAL_COMMUNITY): Payer: PPO

## 2023-03-10 DIAGNOSIS — I509 Heart failure, unspecified: Secondary | ICD-10-CM

## 2023-03-10 DIAGNOSIS — I358 Other nonrheumatic aortic valve disorders: Secondary | ICD-10-CM | POA: Diagnosis not present

## 2023-03-10 DIAGNOSIS — Z83438 Family history of other disorder of lipoprotein metabolism and other lipidemia: Secondary | ICD-10-CM | POA: Diagnosis not present

## 2023-03-10 DIAGNOSIS — Z8673 Personal history of transient ischemic attack (TIA), and cerebral infarction without residual deficits: Secondary | ICD-10-CM | POA: Diagnosis not present

## 2023-03-10 DIAGNOSIS — Y92239 Unspecified place in hospital as the place of occurrence of the external cause: Secondary | ICD-10-CM | POA: Diagnosis present

## 2023-03-10 DIAGNOSIS — I5031 Acute diastolic (congestive) heart failure: Secondary | ICD-10-CM | POA: Diagnosis not present

## 2023-03-10 DIAGNOSIS — Z86718 Personal history of other venous thrombosis and embolism: Secondary | ICD-10-CM | POA: Diagnosis not present

## 2023-03-10 DIAGNOSIS — Z91148 Patient's other noncompliance with medication regimen for other reason: Secondary | ICD-10-CM | POA: Diagnosis not present

## 2023-03-10 DIAGNOSIS — E782 Mixed hyperlipidemia: Secondary | ICD-10-CM | POA: Diagnosis not present

## 2023-03-10 DIAGNOSIS — K567 Ileus, unspecified: Secondary | ICD-10-CM | POA: Diagnosis not present

## 2023-03-10 DIAGNOSIS — R7989 Other specified abnormal findings of blood chemistry: Secondary | ICD-10-CM

## 2023-03-10 DIAGNOSIS — Z8249 Family history of ischemic heart disease and other diseases of the circulatory system: Secondary | ICD-10-CM | POA: Diagnosis not present

## 2023-03-10 DIAGNOSIS — Z1152 Encounter for screening for COVID-19: Secondary | ICD-10-CM | POA: Diagnosis not present

## 2023-03-10 DIAGNOSIS — E7800 Pure hypercholesterolemia, unspecified: Secondary | ICD-10-CM | POA: Diagnosis not present

## 2023-03-10 DIAGNOSIS — N1832 Chronic kidney disease, stage 3b: Secondary | ICD-10-CM | POA: Diagnosis not present

## 2023-03-10 DIAGNOSIS — R079 Chest pain, unspecified: Secondary | ICD-10-CM

## 2023-03-10 DIAGNOSIS — I959 Hypotension, unspecified: Secondary | ICD-10-CM | POA: Diagnosis not present

## 2023-03-10 DIAGNOSIS — K56600 Partial intestinal obstruction, unspecified as to cause: Secondary | ICD-10-CM | POA: Diagnosis not present

## 2023-03-10 DIAGNOSIS — I480 Paroxysmal atrial fibrillation: Secondary | ICD-10-CM | POA: Diagnosis not present

## 2023-03-10 DIAGNOSIS — I6523 Occlusion and stenosis of bilateral carotid arteries: Secondary | ICD-10-CM | POA: Diagnosis not present

## 2023-03-10 DIAGNOSIS — I10 Essential (primary) hypertension: Secondary | ICD-10-CM | POA: Diagnosis not present

## 2023-03-10 DIAGNOSIS — I5A Non-ischemic myocardial injury (non-traumatic): Secondary | ICD-10-CM | POA: Diagnosis not present

## 2023-03-10 DIAGNOSIS — I130 Hypertensive heart and chronic kidney disease with heart failure and stage 1 through stage 4 chronic kidney disease, or unspecified chronic kidney disease: Secondary | ICD-10-CM | POA: Diagnosis not present

## 2023-03-10 DIAGNOSIS — Z888 Allergy status to other drugs, medicaments and biological substances status: Secondary | ICD-10-CM | POA: Diagnosis not present

## 2023-03-10 DIAGNOSIS — I5033 Acute on chronic diastolic (congestive) heart failure: Secondary | ICD-10-CM | POA: Insufficient documentation

## 2023-03-10 DIAGNOSIS — K2970 Gastritis, unspecified, without bleeding: Secondary | ICD-10-CM | POA: Diagnosis not present

## 2023-03-10 DIAGNOSIS — Z833 Family history of diabetes mellitus: Secondary | ICD-10-CM | POA: Diagnosis not present

## 2023-03-10 DIAGNOSIS — Z885 Allergy status to narcotic agent status: Secondary | ICD-10-CM | POA: Diagnosis not present

## 2023-03-10 DIAGNOSIS — I2510 Atherosclerotic heart disease of native coronary artery without angina pectoris: Secondary | ICD-10-CM | POA: Diagnosis not present

## 2023-03-10 DIAGNOSIS — Z7902 Long term (current) use of antithrombotics/antiplatelets: Secondary | ICD-10-CM | POA: Diagnosis not present

## 2023-03-10 DIAGNOSIS — I252 Old myocardial infarction: Secondary | ICD-10-CM | POA: Diagnosis not present

## 2023-03-10 DIAGNOSIS — Z79899 Other long term (current) drug therapy: Secondary | ICD-10-CM | POA: Diagnosis not present

## 2023-03-10 DIAGNOSIS — R109 Unspecified abdominal pain: Secondary | ICD-10-CM | POA: Diagnosis not present

## 2023-03-10 LAB — LIPID PANEL
Cholesterol: 107 mg/dL (ref 0–200)
HDL: 38 mg/dL — ABNORMAL LOW (ref >40)
LDL Cholesterol: 55 mg/dL (ref 0–99)
Total CHOL/HDL Ratio: 2.8 {ratio}
Triglycerides: 72 mg/dL (ref <150)
VLDL: 14 mg/dL (ref 0–40)

## 2023-03-10 LAB — CBC WITH DIFFERENTIAL/PLATELET
Abs Immature Granulocytes: 0.08 10*3/uL — ABNORMAL HIGH (ref 0.00–0.07)
Basophils Absolute: 0 10*3/uL (ref 0.0–0.1)
Basophils Relative: 1 %
Eosinophils Absolute: 0.3 10*3/uL (ref 0.0–0.5)
Eosinophils Relative: 4 %
HCT: 38.9 % — ABNORMAL LOW (ref 39.0–52.0)
Hemoglobin: 12.1 g/dL — ABNORMAL LOW (ref 13.0–17.0)
Immature Granulocytes: 1 %
Lymphocytes Relative: 12 %
Lymphs Abs: 0.9 10*3/uL (ref 0.7–4.0)
MCH: 26.2 pg (ref 26.0–34.0)
MCHC: 31.1 g/dL (ref 30.0–36.0)
MCV: 84.2 fL (ref 80.0–100.0)
Monocytes Absolute: 0.5 10*3/uL (ref 0.1–1.0)
Monocytes Relative: 7 %
Neutro Abs: 5.5 10*3/uL (ref 1.7–7.7)
Neutrophils Relative %: 75 %
Platelets: 216 10*3/uL (ref 150–400)
RBC: 4.62 MIL/uL (ref 4.22–5.81)
RDW: 15 % (ref 11.5–15.5)
WBC: 7.4 10*3/uL (ref 4.0–10.5)
nRBC: 0 % (ref 0.0–0.2)

## 2023-03-10 LAB — ECHOCARDIOGRAM COMPLETE
AR max vel: 2.74 cm2
AV Area VTI: 3.58 cm2
AV Area mean vel: 2.36 cm2
AV Mean grad: 3 mm[Hg]
AV Peak grad: 5.7 mm[Hg]
Ao pk vel: 1.19 m/s
Area-P 1/2: 3.91 cm2
Calc EF: 59.7 %
Height: 74 in
MV VTI: 4.38 cm2
S' Lateral: 2.8 cm
Single Plane A2C EF: 58.3 %
Single Plane A4C EF: 62.1 %
Weight: 3174.62 [oz_av]

## 2023-03-10 LAB — TROPONIN I (HIGH SENSITIVITY)
Troponin I (High Sensitivity): 110 ng/L (ref <18)
Troponin I (High Sensitivity): 115 ng/L (ref <18)
Troponin I (High Sensitivity): 117 ng/L (ref <18)
Troponin I (High Sensitivity): 81 ng/L — ABNORMAL HIGH (ref <18)
Troponin I (High Sensitivity): 88 ng/L — ABNORMAL HIGH (ref <18)

## 2023-03-10 LAB — MAGNESIUM: Magnesium: 2 mg/dL (ref 1.7–2.4)

## 2023-03-10 LAB — HIV ANTIBODY (ROUTINE TESTING W REFLEX): HIV Screen 4th Generation wRfx: NONREACTIVE

## 2023-03-10 LAB — TSH: TSH: 1.944 u[IU]/mL (ref 0.350–4.500)

## 2023-03-10 LAB — HEMOGLOBIN A1C
Hgb A1c MFr Bld: 6 % — ABNORMAL HIGH (ref 4.8–5.6)
Mean Plasma Glucose: 125.5 mg/dL

## 2023-03-10 LAB — PHOSPHORUS: Phosphorus: 3 mg/dL (ref 2.5–4.6)

## 2023-03-10 MED ORDER — FUROSEMIDE 10 MG/ML IJ SOLN
40.0000 mg | Freq: Two times a day (BID) | INTRAMUSCULAR | Status: DC
  Administered 2023-03-10 – 2023-03-11 (×2): 40 mg via INTRAVENOUS
  Filled 2023-03-10 (×4): qty 4

## 2023-03-10 MED ORDER — SODIUM CHLORIDE 0.9% FLUSH
3.0000 mL | Freq: Two times a day (BID) | INTRAVENOUS | Status: DC
  Administered 2023-03-10 – 2023-03-14 (×8): 3 mL via INTRAVENOUS

## 2023-03-10 MED ORDER — SODIUM CHLORIDE 0.9 % IV SOLN: 250.0000 mL | INTRAVENOUS | Status: DC | PRN

## 2023-03-10 MED ORDER — SODIUM CHLORIDE 0.9% FLUSH
3.0000 mL | Freq: Two times a day (BID) | INTRAVENOUS | Status: DC
  Administered 2023-03-10 – 2023-03-13 (×5): 3 mL via INTRAVENOUS

## 2023-03-10 MED ORDER — HYDRALAZINE HCL 50 MG PO TABS
100.0000 mg | ORAL_TABLET | Freq: Three times a day (TID) | ORAL | Status: DC
  Administered 2023-03-10 – 2023-03-13 (×8): 100 mg via ORAL
  Filled 2023-03-10 (×8): qty 2

## 2023-03-10 MED ORDER — EZETIMIBE 10 MG PO TABS
10.0000 mg | ORAL_TABLET | Freq: Every day | ORAL | Status: DC
  Administered 2023-03-10 – 2023-03-13 (×4): 10 mg via ORAL
  Filled 2023-03-10 (×4): qty 1

## 2023-03-10 MED ORDER — ACETAMINOPHEN 650 MG RE SUPP: 650.0000 mg | Freq: Four times a day (QID) | RECTAL | Status: DC | PRN

## 2023-03-10 MED ORDER — ASPIRIN 81 MG PO TBEC
81.0000 mg | DELAYED_RELEASE_TABLET | Freq: Every day | ORAL | Status: DC
  Administered 2023-03-10 – 2023-03-14 (×5): 81 mg via ORAL
  Filled 2023-03-10 (×5): qty 1

## 2023-03-10 MED ORDER — ISOSORBIDE MONONITRATE ER 30 MG PO TB24
30.0000 mg | ORAL_TABLET | Freq: Every day | ORAL | Status: DC
  Administered 2023-03-10 – 2023-03-13 (×4): 30 mg via ORAL
  Filled 2023-03-10 (×4): qty 1

## 2023-03-10 MED ORDER — BISACODYL 5 MG PO TBEC
5.0000 mg | DELAYED_RELEASE_TABLET | Freq: Every day | ORAL | Status: DC | PRN
  Administered 2023-03-12 – 2023-03-13 (×2): 5 mg via ORAL
  Filled 2023-03-10 (×2): qty 1

## 2023-03-10 MED ORDER — HYDRALAZINE HCL 20 MG/ML IJ SOLN: 10.0000 mg | INTRAMUSCULAR | Status: DC | PRN

## 2023-03-10 MED ORDER — FUROSEMIDE 10 MG/ML IJ SOLN
20.0000 mg | Freq: Once | INTRAMUSCULAR | Status: AC
  Administered 2023-03-10: 20 mg via INTRAVENOUS
  Filled 2023-03-10: qty 2

## 2023-03-10 MED ORDER — OXYCODONE HCL 5 MG PO TABS
5.0000 mg | ORAL_TABLET | ORAL | Status: DC | PRN
  Administered 2023-03-10 – 2023-03-14 (×9): 5 mg via ORAL
  Filled 2023-03-10 (×9): qty 1

## 2023-03-10 MED ORDER — SPIRONOLACTONE 12.5 MG HALF TABLET
12.5000 mg | ORAL_TABLET | Freq: Every day | ORAL | Status: DC
  Administered 2023-03-10 – 2023-03-14 (×5): 12.5 mg via ORAL
  Filled 2023-03-10 (×5): qty 1

## 2023-03-10 MED ORDER — SODIUM CHLORIDE 0.9% FLUSH: 3.0000 mL | INTRAVENOUS | Status: DC | PRN

## 2023-03-10 MED ORDER — ACETAMINOPHEN 325 MG PO TABS
650.0000 mg | ORAL_TABLET | Freq: Four times a day (QID) | ORAL | Status: DC | PRN
  Administered 2023-03-11: 650 mg via ORAL
  Filled 2023-03-10: qty 2

## 2023-03-10 MED ORDER — CLOPIDOGREL BISULFATE 75 MG PO TABS
75.0000 mg | ORAL_TABLET | Freq: Every day | ORAL | Status: DC
  Administered 2023-03-10 – 2023-03-14 (×5): 75 mg via ORAL
  Filled 2023-03-10 (×5): qty 1

## 2023-03-10 MED ORDER — POTASSIUM CHLORIDE CRYS ER 20 MEQ PO TBCR
40.0000 meq | EXTENDED_RELEASE_TABLET | Freq: Once | ORAL | Status: AC
  Administered 2023-03-10: 40 meq via ORAL
  Filled 2023-03-10: qty 2

## 2023-03-10 MED ORDER — SENNOSIDES-DOCUSATE SODIUM 8.6-50 MG PO TABS
1.0000 | ORAL_TABLET | Freq: Every evening | ORAL | Status: DC | PRN
  Administered 2023-03-11: 1 via ORAL
  Filled 2023-03-10: qty 1

## 2023-03-10 MED ORDER — HEPARIN SODIUM (PORCINE) 5000 UNIT/ML IJ SOLN
5000.0000 [IU] | Freq: Three times a day (TID) | INTRAMUSCULAR | Status: DC
  Administered 2023-03-10 – 2023-03-14 (×13): 5000 [IU] via SUBCUTANEOUS
  Filled 2023-03-10 (×13): qty 1

## 2023-03-10 MED ORDER — FLEET ENEMA RE ENEM: 1.0000 | ENEMA | Freq: Once | RECTAL | Status: DC | PRN

## 2023-03-10 MED ORDER — ONDANSETRON HCL 4 MG PO TABS: 4.0000 mg | ORAL_TABLET | Freq: Four times a day (QID) | ORAL | Status: DC | PRN

## 2023-03-10 MED ORDER — ONDANSETRON HCL 4 MG/2ML IJ SOLN: 4.0000 mg | Freq: Four times a day (QID) | INTRAMUSCULAR | Status: DC | PRN

## 2023-03-10 MED ORDER — TRAZODONE HCL 50 MG PO TABS
25.0000 mg | ORAL_TABLET | Freq: Every evening | ORAL | Status: DC | PRN
  Filled 2023-03-10: qty 1

## 2023-03-10 MED ORDER — HYDRALAZINE HCL 50 MG PO TABS
100.0000 mg | ORAL_TABLET | Freq: Three times a day (TID) | ORAL | Status: DC
  Administered 2023-03-10: 100 mg via ORAL
  Filled 2023-03-10: qty 2

## 2023-03-10 MED ORDER — HYDROMORPHONE HCL 1 MG/ML IJ SOLN: 0.5000 mg | INTRAMUSCULAR | Status: DC | PRN

## 2023-03-10 MED ORDER — ALBUTEROL SULFATE (2.5 MG/3ML) 0.083% IN NEBU: 2.5000 mg | INHALATION_SOLUTION | RESPIRATORY_TRACT | Status: DC | PRN

## 2023-03-10 MED ORDER — CARVEDILOL 12.5 MG PO TABS
25.0000 mg | ORAL_TABLET | Freq: Two times a day (BID) | ORAL | Status: DC
  Administered 2023-03-10 – 2023-03-14 (×9): 25 mg via ORAL
  Filled 2023-03-10 (×9): qty 2

## 2023-03-10 MED ORDER — ATORVASTATIN CALCIUM 40 MG PO TABS
80.0000 mg | ORAL_TABLET | Freq: Every day | ORAL | Status: DC
  Administered 2023-03-10 – 2023-03-13 (×4): 80 mg via ORAL
  Filled 2023-03-10 (×5): qty 2

## 2023-03-10 NOTE — Evaluation (Signed)
Physical Therapy Brief Evaluation and Discharge Note Patient Details Name: Billy [name] MRN: [id number] DOB: [birth year] Today's Date: 03/10/2023   History of Present Illness  Billy [name] is a 72 y.o. male with history of carotid artery disease, anemia, atrial fibrillation, HLD, DVT, HTN, CKD stage 3 and prior TIA who is being seen 03/10/2023 for the evaluation of dyspnea at the request of Dr. Deretha Emory.  Clinical Impression  Pt presents with admitting diagnosis above. Pt today was able to ambulate in hallway independently with no AD. Pt reports PTA he was fully independent and driving. Pt presents at or near baseline mobility. Pt has no further acute PT needs and will be signing off. Re consult PT if mobility status changes. Pt would benefit from continued mobility with mobility specialist during acute stay.      PT Assessment Patient does not need any further PT services  Assistance Needed at Discharge  PRN    Equipment Recommendations None recommended by PT  Recommendations for Other Services       Precautions/Restrictions Precautions Precautions: Fall Restrictions Weight Bearing Restrictions: No        Mobility  Bed Mobility   Supine/Sidelying to sit: Modified independent (Device/Increased time) Sit to supine/sidelying: Modified independent (Device/Increased time)    Transfers Overall transfer level: Independent Equipment used: None                    Ambulation/Gait Ambulation/Gait assistance: Independent Gait Distance (Feet): 150 Feet Assistive device: None Gait Pattern/deviations: WFL(Within Functional Limits), Trunk flexed Gait Speed: Pace WFL General Gait Details: no LOB noted.  Home Activity Instructions    Stairs            Modified Rankin (Stroke Patients Only)        Balance Overall balance assessment: No apparent balance deficits (not formally assessed)                        Pertinent Vitals/Pain PT - Brief Vital  Signs All Vital Signs Stable: Yes Pain Assessment Pain Assessment: No/denies pain     Home Living Family/patient expects to be discharged to:: Private residence Living Arrangements: Alone Available Help at Discharge: Family;Available PRN/intermittently Home Environment: Ramped entrance   Home Equipment: Hand held shower head        Prior Function Level of Independence: Independent      UE/LE Assessment   UE ROM/Strength/Tone/Coordination: WFL    LE ROM/Strength/Tone/Coordination: [facility]      Communication   Communication Communication: No apparent difficulties     Cognition Overall Cognitive Status: Appears within functional limits for tasks assessed/performed       General Comments General comments (skin integrity, edema, etc.): VSS    Exercises     Assessment/Plan    PT Problem List         PT Visit Diagnosis Other abnormalities of gait and mobility (R26.89)    No Skilled PT Patient at baseline level of functioning;Patient is independent with all acitivity/mobility   Co-evaluation                AMPAC 6 Clicks Help needed turning from your back to your side while in a flat bed without using bedrails?: None Help needed moving from lying on your back to sitting on the side of a flat bed without using bedrails?: None Help needed moving to and from a bed to a chair (including a wheelchair)?: None Help needed standing up from a chair using your  arms (e.g., wheelchair or bedside chair)?: None Help needed to walk in hospital room?: None Help needed climbing 3-5 steps with a railing? : A Little 6 Click Score: 23      End of Session Equipment Utilized During Treatment: Gait belt Activity Tolerance: Patient tolerated treatment well Patient left: in bed;with call bell/phone within reach Nurse Communication: Mobility status PT Visit Diagnosis: Other abnormalities of gait and mobility (R26.89)     Time: 1431-1441 PT Time Calculation (min) (ACUTE  ONLY): 10 min  Charges:   PT Evaluation $PT Eval Low Complexity: 1 Low      Jamaris Biernat B, PT, DPT Acute Rehab Services [phone]   Gladys Damme  03/10/2023, 3:07 PM

## 2023-03-10 NOTE — Progress Notes (Signed)
  Echocardiogram 2D Echocardiogram has been performed.  Ocie Doyne RDCS 03/10/2023, 2:36 PM
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
## 2023-03-10 NOTE — ED Notes (Signed)
Phlebotomy to come draw labs.

## 2023-03-10 NOTE — TOC Initial Note (Signed)
Transition of Care [facility]) - Initial/Assessment Note    Patient Details  Name: Billy [name] MRN: [id number] Date of Birth: August 04, 1950  Transition of Care [facility]) CM/SW Contact:    Billy Haven, RN Phone Number: 03/10/2023, 4:13 PM  Clinical Narrative:                 From home alone, has PCP and insurance on file, states has no HH services in place at this time or DME at home.  States his sister, Billy [name] will transport him home at [employer] and she is support system, [facility] [address].Billy [name] self ambulatory.   Expected Discharge Plan: Home/Self Care Barriers to Discharge: Continued Medical Work up   Patient Goals and CMS Choice Patient states their goals for this hospitalization and ongoing recovery are:: return home   Choice offered to / list presented to : NA      Expected Discharge Plan and Services In-house Referral: NA Discharge Planning Services: CM Consult Post Acute Care Choice: NA Living arrangements for the past 2 months: Single Family Home                   DME Agency: NA       HH Arranged: NA          Prior Living Arrangements/Services Living arrangements for the past 2 months: Single Family Home Lives with:: Self Patient language and need for interpreter reviewed:: Yes Do you feel safe going back to the place where you live?: Yes      Need for Family Participation in Patient Care: No (Comment) Care giver support system in place?: No (comment)   Criminal Activity/Legal Involvement Pertinent to Current Situation/Hospitalization: No - Comment as needed  Activities of Daily Living   ADL Screening (condition at time of admission) Independently performs ADLs?: Yes (appropriate for developmental age) Is the patient deaf or have difficulty hearing?: No Does the patient have difficulty seeing, even when wearing glasses/contacts?: No Does the patient have difficulty concentrating, remembering, or making decisions?:  No  Permission Sought/Granted Permission sought to share information with : Case Manager Permission granted to share information with : Yes, Verbal Permission Granted              Emotional Assessment Appearance:: Appears stated age Attitude/Demeanor/Rapport: Engaged Affect (typically observed): Appropriate Orientation: : Oriented to Self, Oriented to Place, Oriented to  Time, Oriented to Situation Alcohol / Substance Use: Not Applicable Psych Involvement: No (comment)  Admission diagnosis:  Elevated troponin [R79.89] Acute exacerbation of CHF (congestive heart failure) (HCC) [I50.9] Congestive heart failure, unspecified HF chronicity, unspecified heart failure type (HCC) [I50.9] Patient Active Problem List   Diagnosis Date Noted   Acute exacerbation of CHF (congestive heart failure) (HCC) 03/10/2023   Carotid artery stenosis 10/13/2022   Carotid artery stenosis, asymptomatic, right 10/13/2022   Asymptomatic carotid artery stenosis, left 07/28/2022   Biceps tendonitis on right    Tear of right supraspinatus tendon    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    PAF (paroxysmal atrial fibrillation) (HCC) 09/13/2018   Upper GI bleeding 07/27/2018   History of non-ST elevation myocardial infarction (NSTEMI) 06/15/2018   AKI (acute kidney injury) (HCC) 04/11/2018   Gastrointestinal hemorrhage with melena 04/11/2018   Acute blood loss anemia 04/11/2018   Metabolic acidosis, normal anion gap (NAG) 04/11/2018   Carotid artery disease (HCC) 01/17/2018   Hyperlipidemia 01/17/2018   Peripheral neuropathy  12/25/2017   Hypertension 12/12/2017   Transient neurologic deficit 12/12/2017   History of CVA (cerebrovascular accident) 12/12/2017   Transient ischemic attack    Hypertensive 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 CHF 04/20/2017   History of DVT (deep vein thrombosis) 04/20/2017   Hematuria 04/20/2017   Kidney disease, chronic, stage III (GFR  30-59 ml/min) (HCC) 04/20/2017   PCP:  [name], MD Pharmacy:   [phone] [place] [place] - [address] AT [facility] [address] [place] [address] Phone: [phone] Fax: [phone]  [facility] [address] [place], [address] [place] [address] Phone: [phone] Fax: [phone]  [facility] [address] [place] [address] Phone: [phone] Fax: [phone]     Social Determinants of Health (SDOH) Social History: SDOH Screenings   Food Insecurity: No Food Insecurity (03/10/2023)  Housing: Low Risk  (03/10/2023)  Transportation Needs: No Transportation Needs (03/10/2023)  Utilities: Not At Risk (03/10/2023)  Depression (PHQ2-9): Low Risk  (12/01/2020)  Tobacco Use: Low Risk  (03/09/2023)   SDOH Interventions:     Readmission Risk Interventions    10/14/2022   11:29 AM  Readmission Risk Prevention Plan  Post Dischage Appt Complete  Medication Screening Complete  Transportation Screening Complete

## 2023-03-10 NOTE — Plan of Care (Signed)
  Problem: Education: Goal: Ability to demonstrate management of disease process will improve Outcome: Progressing Goal: Ability to verbalize understanding of medication therapies will improve Outcome: Progressing Goal: Individualized Educational Video(s) Outcome: Progressing   Problem: Activity: Goal: Capacity to carry out activities will improve Outcome: Progressing   
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
## 2023-03-11 DIAGNOSIS — N1832 Chronic kidney disease, stage 3b: Secondary | ICD-10-CM

## 2023-03-11 DIAGNOSIS — I10 Essential (primary) hypertension: Secondary | ICD-10-CM | POA: Diagnosis not present

## 2023-03-11 DIAGNOSIS — I5033 Acute on chronic diastolic (congestive) heart failure: Secondary | ICD-10-CM

## 2023-03-11 DIAGNOSIS — I480 Paroxysmal atrial fibrillation: Secondary | ICD-10-CM | POA: Diagnosis not present

## 2023-03-11 DIAGNOSIS — I5031 Acute diastolic (congestive) heart failure: Secondary | ICD-10-CM | POA: Diagnosis not present

## 2023-03-11 DIAGNOSIS — I6523 Occlusion and stenosis of bilateral carotid arteries: Secondary | ICD-10-CM

## 2023-03-11 DIAGNOSIS — E782 Mixed hyperlipidemia: Secondary | ICD-10-CM

## 2023-03-11 LAB — BRAIN NATRIURETIC PEPTIDE: B Natriuretic Peptide: 491.3 pg/mL — ABNORMAL HIGH (ref 0.0–100.0)

## 2023-03-11 LAB — BASIC METABOLIC PANEL
Anion gap: 9 (ref 5–15)
BUN: 24 mg/dL — ABNORMAL HIGH (ref 8–23)
CO2: 25 mmol/L (ref 22–32)
Calcium: 8.8 mg/dL — ABNORMAL LOW (ref 8.9–10.3)
Chloride: 108 mmol/L (ref 98–111)
Creatinine, Ser: 2.12 mg/dL — ABNORMAL HIGH (ref 0.61–1.24)
GFR, Estimated: 33 mL/min — ABNORMAL LOW (ref >60)
Glucose, Bld: 114 mg/dL — ABNORMAL HIGH (ref 70–99)
Potassium: 3.5 mmol/L (ref 3.5–5.1)
Sodium: 142 mmol/L (ref 135–145)

## 2023-03-11 LAB — CBC
HCT: 36 % — ABNORMAL LOW (ref 39.0–52.0)
Hemoglobin: 11.4 g/dL — ABNORMAL LOW (ref 13.0–17.0)
MCH: 26.6 pg (ref 26.0–34.0)
MCHC: 31.7 g/dL (ref 30.0–36.0)
MCV: 84.1 fL (ref 80.0–100.0)
Platelets: 221 10*3/uL (ref 150–400)
RBC: 4.28 MIL/uL (ref 4.22–5.81)
RDW: 15 % (ref 11.5–15.5)
WBC: 8.1 10*3/uL (ref 4.0–10.5)
nRBC: 0 % (ref 0.0–0.2)

## 2023-03-11 LAB — GLUCOSE, CAPILLARY: Glucose-Capillary: 111 mg/dL — ABNORMAL HIGH (ref 70–99)

## 2023-03-11 LAB — APTT: aPTT: 39 s — ABNORMAL HIGH (ref 24–36)

## 2023-03-11 LAB — PROTIME-INR
INR: 1.3 — ABNORMAL HIGH (ref 0.8–1.2)
Prothrombin Time: 16.4 s — ABNORMAL HIGH (ref 11.4–15.2)

## 2023-03-11 LAB — MAGNESIUM: Magnesium: 2 mg/dL (ref 1.7–2.4)

## 2023-03-11 LAB — HIV ANTIBODY (ROUTINE TESTING W REFLEX): HIV Screen 4th Generation wRfx: NONREACTIVE

## 2023-03-11 MED ORDER — POTASSIUM CHLORIDE CRYS ER 20 MEQ PO TBCR
40.0000 meq | EXTENDED_RELEASE_TABLET | Freq: Once | ORAL | Status: AC
  Administered 2023-03-11: 40 meq via ORAL
  Filled 2023-03-11: qty 2

## 2023-03-11 MED ORDER — FUROSEMIDE 10 MG/ML IJ SOLN
80.0000 mg | Freq: Two times a day (BID) | INTRAMUSCULAR | Status: DC
  Administered 2023-03-11 – 2023-03-13 (×4): 80 mg via INTRAVENOUS
  Filled 2023-03-11 (×4): qty 8

## 2023-03-11 NOTE — Hospital Course (Signed)
Billy [name] was admitted to the hospital with the working diagnosis of heart failure exacerbation.   72 yo male with the past medical history of heart failure, CKD, coronary artery disease, hyperlipidemia, and paroxysmal atrial fibrillation who presented with dyspnea. Reported worsening dyspnea and progressive lower extremity edema. Positive orthopnea and fatigue. Apparently he stopped taking furosemide 2 months ago due to polyuria.  On his initial physical examination his blood pressure was 170/75, HR 76, RR 18 and 02 saturation 98% on room air, lungs with no wheezing or rhonchi, heart with S1 and S2 present and regular with no gallops, rubs or murmurs, abdomen with no distention and positive lower extremity edema.   Na 141, K 3,5 Cl 111, bicarbonate 16, glucose 87, bun 18 and cr 1.85  BNP 660  High sensitive troponin 53, 63, 81 and 110  Wbc 7,4 hgb 12.1 plt 216  Sars covid 19 negative   Chest radiograph with bilateral hilar vascular congestion with bilateral central interstitial infiltrates, with fluid in the right fissure and small bilateral pleural effusions.   EKG 76 bpm, normal axis, normal intervals, sinus rhythm with no significant ST segment changes, negative T waves lead II, III, aVF, V4 to V6. (No new changes).   11/24 improving volume status with diuresis.  Abdominal radiograph with dilatated left upper quadrant small bowel loop.  11/25 improved volume status. Transition to oral loop diuretic in am.  11/26 volume status has improved, patient will continue diuresis with oral furosemide and will need close follow up as outpatient.

## 2023-03-11 NOTE — Assessment & Plan Note (Addendum)
Echocardiogram with preserved LV systolic function with EF 60 to 65%, mild LVH, RV systolic function preserved, LA and RA with mild dilatation, no significant valvular disease.  Urine output is 2,975 ml  Systolic blood pressure 140 to 102 mmHg.   Plan to continue diuresis with IV furosemide 80 mg bid and spironolactone 25 mg po daily.  After load reduction with hydralazine and isosorbide.  Carvedilol 25 mg bid.   Troponin elevation due to heart failure, ruled out for acute coronary syndrome.

## 2023-03-11 NOTE — Progress Notes (Signed)
  Progress Note   Patient: Billy [name] ZOX:[id number] DOB: 09-11-1950 DOA: 03/09/2023     1 DOS: the patient was seen and examined on 03/11/2023   Brief hospital course: Billy [name] was admitted to the hospital with the working diagnosis of heart failure exacerbation.   72 yo male with the past medical history of heart failure, CKD, coronary artery disease, hyperlipidemia, and paroxysmal atrial fibrillation who presented with dyspnea. Reported worsening dyspnea and progressive lower extremity edema. Positive orthopnea and fatigue. Apparently he stopped taking furosemide 2 months ago due to polyuria.  On his initial physical examination his blood pressure was 170/75, HR 76, RR 18 and 02 saturation 98% on room air, lungs with no wheezing or rhonchi, heart with S1 and S2 present and regular with no gallops, rubs or murmurs, abdomen with no distention and positive lower extremity edema.   Chest radiograph with bilateral hilar vascular congestion with bilateral central interstitial infiltrates, with fluid in the right fissure and small bilateral pleural effusions.   Assessment and Plan: * Acute on chronic diastolic CHF (congestive heart failure) (HCC) Echocardiogram with preserved LV systolic function with EF 60 to 65%, mild LVH, RV systolic function preserved, LA and RA with mild dilatation, no significant valvular disease.  Urine output is 2,975 ml  Systolic blood pressure 140 to 102 mmHg.   Plan to continue diuresis with IV furosemide 80 mg bid and spironolactone 25 mg po daily.  After load reduction with hydralazine and isosorbide.  Carvedilol 25 mg bid.   Troponin elevation due to heart failure, ruled out for acute coronary syndrome.   PAF (paroxysmal atrial fibrillation) (HCC) Continue carvedilol for rate control.  Patient not on anticoagulation due to history of gastro intestinal bleeding.   CKD stage 3b, GFR 30-44 ml/min (HCC) Renal function with serum cr at 2,1 with K at 3,5 and serum  bicarbonate at 25.  Na 142 and Mg 2.0   Plan to add 40 meq Kcl x1 to prevent hypokalemia.  Follow up renal function and electrolytes in am.   Hypertension Continue blood pressure control with carvedilol Continue aggressive diuresis with furosemide.   Carotid artery disease (HCC) Continue antiplatelet therapy and statin   Hyperlipidemia Continue statin therapy.         Subjective: Patient is feeling better but not yet back to baseline, continue to have dyspnea and edema   Physical Exam: Vitals:   03/11/23 0451 03/11/23 0718 03/11/23 0926 03/11/23 1205  BP: (!) 173/80 (!) 167/67 (!) 167/67 (!) 102/53  Pulse: 79 75  97  Resp: 19 15  19   Temp: 97.7 F (36.5 C) 97.6 F (36.4 C)  98.2 F (36.8 C)  TempSrc: Oral Oral  Oral  SpO2: 92% 94%  93%  Weight: 89.9 kg     Height:       Neurology awake and alert ENT with mild pallor Cardiovascular with S1 and S2 present and regular with no gallops, rubs or murmurs No JVD Positive lower extremity edema ++ Respiratory with positive rales bilaterally with no wheezing or rhonchi Abdomen with no distention  Data Reviewed:    Family Communication: no family at the bedside   Disposition: Status is: Inpatient Remains inpatient appropriate because: IV diuresis   Planned Discharge Destination: Home     Author: Coralie Keens, MD 03/11/2023 4:05 PM  For on call review [email or web address].

## 2023-03-11 NOTE — Assessment & Plan Note (Addendum)
Continue statin and ezetimibe therapy.

## 2023-03-11 NOTE — Assessment & Plan Note (Signed)
Renal function with serum cr at 2,4 with K at 4,4 and serum bicarbonate at 28  Na 142 and Mg 2.3   Plan to continue furosemide and spironolactone, with close follow up as outpatient on renal function and electrolytes.

## 2023-03-11 NOTE — Assessment & Plan Note (Signed)
Continue carvedilol for rate control.  Patient not on anticoagulation due to history of gastro intestinal bleeding.

## 2023-03-11 NOTE — Plan of Care (Signed)
Patient progressing on all care plans. Patient educated on plan of care and all questions and concerns addressed. Patient experienced pain to back today relieved with repositioning, medication and heat packs. Continuing to receive lasix as ordered. Patient able to ambulate with assist in room and to bathroom.

## 2023-03-11 NOTE — Assessment & Plan Note (Signed)
Continue antiplatelet therapy and statin

## 2023-03-11 NOTE — Evaluation (Signed)
Occupational Therapy Evaluation Patient Details Name: Billy [name] MRN: [id number] DOB: [birth year] Today's Date: 03/11/2023   History of Present Illness Billy [name] is a 71 y.o. male with history of carotid artery disease, anemia, atrial fibrillation, HLD, DVT, HTN, CKD stage 3 and prior TIA who is being seen 03/10/2023 for the evaluation of dyspnea at the request of Dr. Deretha Emory.   Clinical Impression   At baseline, pt is Independent with ADLs and IADLs. Pt now presents with decreased activity tolerance and decreased safety and independence with LB dressing/bathing secondary to increasing dyspnea with leaning forward. Pt current demonstrates ability to complete LB dressing/bathing with Min assist. Pt currently demonstrates ability to complete all other ADLs and functional transfers/mobility without an AD Independent to Mod I. Pt will benefit from acute skilled OT services to address safety and independence with LB dressing/bathing and for education in energy conservation strategies and strategies for improved ongoing management of CHF. Post acute discharge, no OT follow up is indicated at this time.       If plan is discharge home, recommend the following: Other (comment);A little help with bathing/dressing/bathroom (PRN assistance with IADLs and transportation)    Functional Status Assessment  Patient has had a recent decline in their functional status and demonstrates the ability to make significant improvements in function in a reasonable and predictable amount of time.  Equipment Recommendations  None recommended by OT    Recommendations for Other Services       Precautions / Restrictions Precautions Precautions: Fall Restrictions Weight Bearing Restrictions: No      Mobility Bed Mobility               General bed mobility comments: Pt ambulating in room upon arrival and sitting in recliner at end of session.    Transfers Overall transfer level: Independent Equipment  used: None                      Balance Overall balance assessment: No apparent balance deficits (not formally assessed)                                         ADL either performed or assessed with clinical judgement   ADL Overall ADL's : Needs assistance/impaired;Independent;Modified independent                                       General ADL Comments: Pt largely Independent to Mod I with ADLs and functional mobility/transfers without an AD. Pt currently requiring Min assist with LB dressing/bathing due to worsening dyspnea when leaning forward. Pt also demonstrating decreased activity tolerance during functional tasks.     Vision Baseline Vision/History: 1 Wears glasses Ability to See in Adequate Light: 0 Adequate (with glasses) Patient Visual Report: No change from baseline       Perception         Praxis         Pertinent Vitals/Pain Pain Assessment Pain Assessment: Faces Faces Pain Scale: Hurts little more Pain Location: back Pain Descriptors / Indicators: Aching, Discomfort, Sore, Guarding, Grimacing Pain Intervention(s): Limited activity within patient's tolerance, Monitored during session     Extremity/Trunk Assessment Upper Extremity Assessment Upper Extremity Assessment: Right hand dominant;Overall [facility] for tasks assessed   Lower Extremity Assessment Lower Extremity Assessment: Defer to  PT evaluation       Communication Communication Communication: No apparent difficulties   Cognition Arousal: Alert Behavior During Therapy: WFL for tasks assessed/performed Overall Cognitive Status: Within Functional Limits for tasks assessed                                 General Comments: AAOx4 and pleasant throughout session. Able to follow multi-step instructions consistently. Demonstrates good safety awareness.     General Comments  VSS on RA throughout session.    Exercises     Shoulder  Instructions      Home Living Family/patient expects to be discharged to:: Private residence Living Arrangements: Alone Available Help at Discharge: Family;Available PRN/intermittently (Sister) Type of Home: House Home Access: Ramped entrance     Home Layout: One level     Bathroom Shower/Tub: Chief Strategy Officer: Standard Bathroom Accessibility: No   Home Equipment: Hand held shower head          Prior Functioning/Environment Prior Level of Function : Independent/Modified Independent;Driving             Mobility Comments: Ind no AD ADLs Comments: Ind with ADLs and IADLs        OT Problem List: Decreased activity tolerance      OT Treatment/Interventions: Self-care/ADL training;Energy conservation;Other (comment) (Education in managing CHF)    OT Goals(Current goals can be found in the care plan section) Acute Rehab OT Goals Patient Stated Goal: to continue to get better and return home OT Goal Formulation: With patient Time For Goal Achievement: 03/25/23 Potential to Achieve Goals: Good ADL Goals Pt Will Perform Lower Body Bathing: with modified independence;sitting/lateral leans;sit to/from stand Pt Will Perform Lower Body Dressing: with modified independence;sitting/lateral leans;sit to/from stand Additional ADL Goal #1: Patient will demonstrate ability to Independently state 4 energy conservation strategies to increase safety and independence with functional tasks with handout provided. Additional ADL Goal #2: Patient will demonstrate understanding of education in strategies for managing CHF through teach back with handout provided.  OT Frequency: Min 1X/week    Co-evaluation              AM-PAC OT "6 Clicks" Daily Activity     Outcome Measure Help from another person eating meals?: None Help from another person taking care of personal grooming?: None Help from another person toileting, which includes using toliet, bedpan, or urinal?:  None Help from another person bathing (including washing, rinsing, drying)?: A Little Help from another person to put on and taking off regular upper body clothing?: None Help from another person to put on and taking off regular lower body clothing?: A Little 6 Click Score: 22   End of Session Nurse Communication: Mobility status  Activity Tolerance: Patient tolerated treatment well Patient left: in chair;with call bell/phone within reach  OT Visit Diagnosis: Other (comment) (decreased activity tolerance)                Time: 6433-2951 OT Time Calculation (min): 22 min Charges:  OT General Charges $OT Visit: 1 Visit OT Evaluation $OT Eval Low Complexity: 1 Low  Mor[facility]star Toft "Orson Eva., OTR/L, MA Acute Rehab [phone]   Lendon Colonel 03/11/2023, 5:46 PM

## 2023-03-11 NOTE — Progress Notes (Signed)
   Patient Name: Billy [name] Date of Encounter: 03/11/2023 [facility] Cardiologist: Nanetta Batty, MD   Interval Summary  .    Echo this admission ejection fraction 65% mild left ventricular hypertrophy, mildly dilated atria no significant valvular disease  He has a male toy dog at home  Vital Signs .    Vitals:   03/11/23 0035 03/11/23 0451 03/11/23 0718 03/11/23 0926  BP: (!) 148/81 (!) 173/80 (!) 167/67 (!) 167/67  Pulse: 69 79 75   Resp: 19 19 15    Temp: (!) 97.5 F (36.4 C) 97.7 F (36.5 C) 97.6 F (36.4 C)   TempSrc: Oral Oral Oral   SpO2: 95% 92% 94%   Weight:  89.9 kg    Height:        Intake/Output Summary (Last 24 hours) at 03/11/2023 1013 Last data filed at 03/11/2023 0957 Gross per 24 hour  Intake 243 ml  Output 3275 ml  Net -3032 ml      03/11/2023    4:51 AM 03/09/2023    1:45 PM 11/18/2022    2:17 PM  Last 3 Weights  Weight (lbs) 198 lb 3.1 oz 198 lb 6.6 oz 198 lb  Weight (kg) 89.9 kg 90 kg 89.812 kg      Telemetry/ECG    Sinus rhythm with PACs- Personally Reviewed  Physical Exam .   GEN: No acute distress.   Neck: No JVD Cardiac: RRR, no murmurs, rubs, or gallops.  Respiratory: Mild crackles at bases bilaterally. GI: Soft, nontender, non-distended  MS: 2+ bilateral lower extremity edema  Assessment & Plan .     72 year old male with atrial fibrillation hyperlipidemia and DVT hypertension chronic kidney disease stage III prior TIA with carotid artery disease here with dyspnea, mildly elevated troponin of 110, mild pulmonary edema on chest x-ray with acute diastolic heart failure  Acute diastolic heart failure - Responded well to IV Lasix, we will increase to 80 mg twice a day.  Creatinine 2.12, has been 2.4 years ago. -On carvedilol 25 twice daily, hydralazine 100 3 times daily, isosorbide mononitrate 30 mg a day, spironolactone 12.5 mg a day  Prior stroke carotid endarterectomy - On Plavix 75 mg a day as well as aspirin 81  mg a day  Elevated troponin 110 - This is myocardial injury secondary to acute diastolic heart failure exacerbation, not acute coronary syndrome  He still has more fluid to go.  For questions or updates, please contact [facility] Please consult [email or web address] for contact info under        Signed, Donato Schultz, MD

## 2023-03-11 NOTE — Assessment & Plan Note (Signed)
Hold on hydralazine and isosorbide due to hypotension.  Continue with carvedilol  Continue close follow up on blood pressure.

## 2023-03-12 ENCOUNTER — Inpatient Hospital Stay (HOSPITAL_COMMUNITY): Payer: PPO

## 2023-03-12 DIAGNOSIS — K2970 Gastritis, unspecified, without bleeding: Secondary | ICD-10-CM

## 2023-03-12 DIAGNOSIS — I10 Essential (primary) hypertension: Secondary | ICD-10-CM | POA: Diagnosis not present

## 2023-03-12 DIAGNOSIS — I480 Paroxysmal atrial fibrillation: Secondary | ICD-10-CM | POA: Diagnosis not present

## 2023-03-12 DIAGNOSIS — N1832 Chronic kidney disease, stage 3b: Secondary | ICD-10-CM | POA: Diagnosis not present

## 2023-03-12 DIAGNOSIS — I5033 Acute on chronic diastolic (congestive) heart failure: Secondary | ICD-10-CM | POA: Diagnosis not present

## 2023-03-12 LAB — BASIC METABOLIC PANEL
Anion gap: 14 (ref 5–15)
BUN: 25 mg/dL — ABNORMAL HIGH (ref 8–23)
CO2: 25 mmol/L (ref 22–32)
Calcium: 9.3 mg/dL (ref 8.9–10.3)
Chloride: 104 mmol/L (ref 98–111)
Creatinine, Ser: 2.09 mg/dL — ABNORMAL HIGH (ref 0.61–1.24)
GFR, Estimated: 33 mL/min — ABNORMAL LOW (ref >60)
Glucose, Bld: 117 mg/dL — ABNORMAL HIGH (ref 70–99)
Potassium: 3.7 mmol/L (ref 3.5–5.1)
Sodium: 143 mmol/L (ref 135–145)

## 2023-03-12 LAB — MAGNESIUM: Magnesium: 1.9 mg/dL (ref 1.7–2.4)

## 2023-03-12 MED ORDER — NALOXONE HCL 0.4 MG/ML IJ SOLN: 0.4000 mg | INTRAMUSCULAR | Status: DC | PRN

## 2023-03-12 MED ORDER — PANTOPRAZOLE SODIUM 40 MG IV SOLR
40.0000 mg | Freq: Two times a day (BID) | INTRAVENOUS | Status: DC
  Administered 2023-03-12: 40 mg via INTRAVENOUS
  Filled 2023-03-12: qty 10

## 2023-03-12 MED ORDER — PANTOPRAZOLE SODIUM 40 MG PO TBEC
40.0000 mg | DELAYED_RELEASE_TABLET | Freq: Every day | ORAL | Status: DC
  Administered 2023-03-13 – 2023-03-14 (×2): 40 mg via ORAL
  Filled 2023-03-12 (×2): qty 1

## 2023-03-12 MED ORDER — HYDROMORPHONE HCL 1 MG/ML IJ SOLN
0.5000 mg | Freq: Once | INTRAMUSCULAR | Status: AC | PRN
  Administered 2023-03-12: 0.5 mg via INTRAVENOUS
  Filled 2023-03-12: qty 0.5

## 2023-03-12 MED ORDER — MAGNESIUM SULFATE 2 GM/50ML IV SOLN
2.0000 g | Freq: Once | INTRAVENOUS | Status: AC
  Administered 2023-03-12: 2 g via INTRAVENOUS
  Filled 2023-03-12: qty 50

## 2023-03-12 MED ORDER — POTASSIUM CHLORIDE CRYS ER 20 MEQ PO TBCR
40.0000 meq | EXTENDED_RELEASE_TABLET | Freq: Once | ORAL | Status: AC
  Administered 2023-03-12: 40 meq via ORAL
  Filled 2023-03-12: qty 2

## 2023-03-12 NOTE — Plan of Care (Signed)
Problem: Education: Goal: Ability to demonstrate management of disease process will improve Outcome: Progressing Goal: Ability to verbalize understanding of medication therapies will improve Outcome: Progressing Goal: Individualized Educational Video(s) Outcome: Progressing   Problem: Activity: Goal: Capacity to carry out activities will improve Outcome: Progressing

## 2023-03-12 NOTE — Progress Notes (Signed)
MD paged regarding patients elevated BP and c/o of abdominal pain that has only been briefly relieved by PRNs.

## 2023-03-12 NOTE — Progress Notes (Signed)
   Patient Name: Billy [name] Date of Encounter: 03/12/2023 [facility] Cardiologist: Nanetta Batty, MD   Interval Summary  .    Currently in bed appears fairly comfortable.  Did receive some IV Dilaudid to help him with left upper quadrant discomfort.  Improving  Vital Signs .    Vitals:   03/11/23 2328 03/12/23 0347 03/12/23 0500 03/12/23 0818  BP: (!) 173/79 (!) 195/82  130/79  Pulse: 74 99  (!) 101  Resp: 18 15  20   Temp: 97.8 F (36.6 C) 97.8 F (36.6 C)  97.7 F (36.5 C)  TempSrc: Oral Oral  Oral  SpO2: 95% 93%  96%  Weight:   87.3 kg   Height:        Intake/Output Summary (Last 24 hours) at 03/12/2023 0956 Last data filed at 03/12/2023 0818 Gross per 24 hour  Intake 828 ml  Output 2250 ml  Net -1422 ml      03/12/2023    5:00 AM 03/11/2023    4:51 AM 03/09/2023    1:45 PM  Last 3 Weights  Weight (lbs) 192 lb 7.4 oz 198 lb 3.1 oz 198 lb 6.6 oz  Weight (kg) 87.3 kg 89.9 kg 90 kg      Telemetry/ECG    Rare PVC - Personally Reviewed  Physical Exam .   GEN: No acute distress.   Neck: No JVD Cardiac: RRR, no murmurs, rubs, or gallops.  Respiratory: Clear to auscultation bilaterally. GI: Soft, nontender, non-distended  MS: No edema  Assessment & Plan .     Se[age]-year-old male admitted with acute diastolic heart failure with concomitant atrial fibrillation prior stroke carotid endarterectomy DVT hypertension chronic kidney disease stage IIIA  Acute diastolic heart failure-echo EF 65% - Currently on IV Lasix 80 mg twice a day, good output, creatinine 2.09 down from 2.1, this is around his baseline. -Spironolactone 12.5 mg a day isosorbide 30 mg a day hydralazine 100 mg 3 times a day -As we continue to diurese, blood pressure should follow.  Hyperlipidemia - On Zetia 10 mg, atorvastatin 80 mg  Elevated troponin - From 50-110, myocardial injury in the setting of heart failure exacerbation  Left upper quadrant small bowel loop dilatation -  Could be isolated ileus or developing small bowel obstruction.  Quite uncomfortable earlier this morning, currently feeling better but still not at baseline.   For questions or updates, please contact [facility] Please consult [email or web address] for contact info under        Signed, Donato Schultz, MD

## 2023-03-12 NOTE — Progress Notes (Signed)
Notified by RN that patient is complaining of ongoing 7/10 intensity left upper quadrant abdominal pain since yesterday despite receiving doses of Oxy IR.  Blood pressure elevated but vital signs otherwise stable.  No nausea or vomiting.  Per RN, his abdomen is not distended and he does have bowel sounds present on auscultation.  Patient is reporting regular bowel movements and denies constipation.  He is concerned that his stomach might be inflamed due to heavy NSAID use at home for chronic back pain (taking lots of Motrin and Goody powder).  Patient states he was told not to take NSAIDs in the past.  Chart reviewed, EGD done in December 2019 showing duodenitis and duodenal ulcer.  -Avoid NSAIDs -Start IV Protonix 40 mg every 12 hours -Continue pain management.  Patient states he has received both morphine and Dilaudid for pain in the past and that morphine causes him to itch but he is able to tolerate Dilaudid.  IV Dilaudid 0.5 mg x 1 ordered. -Perforated ulcer less likely, will get stat KUB

## 2023-03-12 NOTE — Progress Notes (Signed)
Progress Note   Patient: Edy Kraner ZOX:[id number] DOB: Sep 11, 1950 DOA: 03/09/2023     2 DOS: the patient was seen and examined on 03/12/2023   Brief hospital course: Mr. Garciamartinez was admitted to the hospital with the working diagnosis of heart failure exacerbation.   72 yo male with the past medical history of heart failure, CKD, coronary artery disease, hyperlipidemia, and paroxysmal atrial fibrillation who presented with dyspnea. Reported worsening dyspnea and progressive lower extremity edema. Positive orthopnea and fatigue. Apparently he stopped taking furosemide 2 months ago due to polyuria.  On his initial physical examination his blood pressure was 170/75, HR 76, RR 18 and 02 saturation 98% on room air, lungs with no wheezing or rhonchi, heart with S1 and S2 present and regular with no gallops, rubs or murmurs, abdomen with no distention and positive lower extremity edema.   Chest radiograph with bilateral hilar vascular congestion with bilateral central interstitial infiltrates, with fluid in the right fissure and small bilateral pleural effusions.   11/24 improving volume status with diuresis.  Abdominal radiograph with dilatated left upper quadrant small bowel loop.   Assessment and Plan: * Acute on chronic diastolic CHF (congestive heart failure) (HCC) Echocardiogram with preserved LV systolic function with EF 60 to 65%, mild LVH, RV systolic function preserved, LA and RA with mild dilatation, no significant valvular disease.  Urine output is 2,375 ml  Systolic blood pressure 130 to 150 mmHg.   Plan to continue diuresis with IV furosemide 80 mg bid and spironolactone 25 mg po daily.  After load reduction with hydralazine and isosorbide.  Carvedilol 25 mg bid.  Add ted hose for peripheral edema.   Troponin elevation due to heart failure, ruled out for acute coronary syndrome.   PAF (paroxysmal atrial fibrillation) (HCC) Continue carvedilol for rate control.  Patient not on  anticoagulation due to history of gastro intestinal bleeding.   CKD stage 3b, GFR 30-44 ml/min (HCC) Improved volume status, renal function with serum cr at 2,0 with K at 3,7 and serum bicarbonate at 25.  Na 143 and Mg 1,9   Plan to add 40 meq Kcl x1 to prevent hypokalemia and 2 g mag sulfate to prevent hypomagnesemia.  Follow up renal function and electrolytes in am.   Hypertension Continue blood pressure control with carvedilol Continue aggressive diuresis with furosemide.   Carotid artery disease (HCC) Continue antiplatelet therapy and statin   Hyperlipidemia Continue statin and ezetimibe therapy.   Gastritis Improved abdominal pain with antiacid therapy. Will transition to po pantoprazole.  Abdomen radiograph with small bowel loop dilatation. Today patient with no signs of bowel obstruction, tolerating po well.         Subjective: Patient with improvement in abdominal pain, he is tolerating po well with no nausea or vomiting, edema has been improving along with dyspnea   Physical Exam: Vitals:   03/12/23 0347 03/12/23 0500 03/12/23 0818 03/12/23 1111  BP: (!) 195/82  130/79 121/64  Pulse: 99  (!) 101 68  Resp: 15  20 15   Temp: 97.8 F (36.6 C)  97.7 F (36.5 C) 97.6 F (36.4 C)  TempSrc: Oral  Oral Oral  SpO2: 93%  96% 93%  Weight:  87.3 kg    Height:       Neurology awake and alert ENT with mild pallor Cardiovascular with S1 and S2 present and regular with no gallops, rub or murmurs Respiratory with mild rales at bases with no wheezing or rhonchi Abdomen with no distention  Lower  extremity edema +  Data Reviewed:    Family Communication: no family at the bedside   Disposition: Status is: Inpatient Remains inpatient appropriate because: heart failure   Planned Discharge Destination: Home     Author: Coralie Keens, MD 03/12/2023 1:05 PM  For on call review [email or web address].

## 2023-03-12 NOTE — Plan of Care (Signed)
  Problem: Education: Goal: Ability to demonstrate management of disease process will improve Outcome: Progressing Goal: Ability to verbalize understanding of medication therapies will improve Outcome: Progressing   Problem: Activity: Goal: Capacity to carry out activities will improve Outcome: Progressing   Problem: Education: Goal: Knowledge of General Education information will improve Description: Including pain rating scale, medication(s)/side effects and non-pharmacologic comfort measures Outcome: Progressing   Problem: Health Behavior/Discharge Planning: Goal: Ability to manage health-related needs will improve Outcome: Progressing   Problem: Nutrition: Goal: Adequate nutrition will be maintained Outcome: Progressing   Problem: Elimination: Goal: Will not experience complications related to urinary retention Outcome: Progressing   Problem: Safety: Goal: Ability to remain free from injury will improve Outcome: Progressing   Problem: Activity: Goal: Risk for activity intolerance will decrease Outcome: Not Progressing   Problem: Elimination: Goal: Will not experience complications related to bowel motility Outcome: Not Progressing   Problem: Pain Management: Goal: General experience of comfort will improve Outcome: Not Progressing

## 2023-03-12 NOTE — Assessment & Plan Note (Signed)
Improved abdominal pain with antiacid therapy. Will transition to po pantoprazole.  Abdomen radiograph with small bowel loop dilatation. Today patient with no signs of bowel obstruction, tolerating po well.

## 2023-03-13 ENCOUNTER — Other Ambulatory Visit (HOSPITAL_COMMUNITY): Payer: Self-pay

## 2023-03-13 ENCOUNTER — Telehealth (HOSPITAL_COMMUNITY): Payer: Self-pay | Admitting: Pharmacy Technician

## 2023-03-13 DIAGNOSIS — I480 Paroxysmal atrial fibrillation: Secondary | ICD-10-CM | POA: Diagnosis not present

## 2023-03-13 DIAGNOSIS — N1832 Chronic kidney disease, stage 3b: Secondary | ICD-10-CM | POA: Diagnosis not present

## 2023-03-13 DIAGNOSIS — I10 Essential (primary) hypertension: Secondary | ICD-10-CM | POA: Diagnosis not present

## 2023-03-13 DIAGNOSIS — I5033 Acute on chronic diastolic (congestive) heart failure: Secondary | ICD-10-CM | POA: Diagnosis not present

## 2023-03-13 LAB — BASIC METABOLIC PANEL
Anion gap: 10 (ref 5–15)
BUN: 27 mg/dL — ABNORMAL HIGH (ref 8–23)
CO2: 26 mmol/L (ref 22–32)
Calcium: 9.3 mg/dL (ref 8.9–10.3)
Chloride: 103 mmol/L (ref 98–111)
Creatinine, Ser: 1.93 mg/dL — ABNORMAL HIGH (ref 0.61–1.24)
GFR, Estimated: 37 mL/min — ABNORMAL LOW (ref >60)
Glucose, Bld: 106 mg/dL — ABNORMAL HIGH (ref 70–99)
Potassium: 3.7 mmol/L (ref 3.5–5.1)
Sodium: 139 mmol/L (ref 135–145)

## 2023-03-13 LAB — CBC
HCT: 38.9 % — ABNORMAL LOW (ref 39.0–52.0)
Hemoglobin: 12.3 g/dL — ABNORMAL LOW (ref 13.0–17.0)
MCH: 26.7 pg (ref 26.0–34.0)
MCHC: 31.6 g/dL (ref 30.0–36.0)
MCV: 84.4 fL (ref 80.0–100.0)
Platelets: 212 10*3/uL (ref 150–400)
RBC: 4.61 MIL/uL (ref 4.22–5.81)
RDW: 15 % (ref 11.5–15.5)
WBC: 8.2 10*3/uL (ref 4.0–10.5)
nRBC: 0 % (ref 0.0–0.2)

## 2023-03-13 MED ORDER — FUROSEMIDE 40 MG PO TABS: 40.0000 mg | ORAL_TABLET | Freq: Every day | ORAL | Status: DC

## 2023-03-13 MED ORDER — HYDRALAZINE HCL 50 MG PO TABS: 50.0000 mg | ORAL_TABLET | Freq: Three times a day (TID) | ORAL | Status: DC

## 2023-03-13 MED ORDER — FUROSEMIDE 40 MG PO TABS
40.0000 mg | ORAL_TABLET | Freq: Every day | ORAL | Status: DC
  Administered 2023-03-14: 40 mg via ORAL
  Filled 2023-03-13: qty 1

## 2023-03-13 MED ORDER — POTASSIUM CHLORIDE CRYS ER 20 MEQ PO TBCR
40.0000 meq | EXTENDED_RELEASE_TABLET | Freq: Once | ORAL | Status: AC
  Administered 2023-03-13: 40 meq via ORAL
  Filled 2023-03-13: qty 2

## 2023-03-13 NOTE — Progress Notes (Signed)
Progress Note   Patient: Billy [name] ATF:[id number] DOB: 1950-05-17 DOA: 03/09/2023     3 DOS: the patient was seen and examined on 03/13/2023   Brief hospital course: Billy [name] was admitted to the hospital with the working diagnosis of heart failure exacerbation.   72 yo male with the past medical history of heart failure, CKD, coronary artery disease, hyperlipidemia, and paroxysmal atrial fibrillation who presented with dyspnea. Reported worsening dyspnea and progressive lower extremity edema. Positive orthopnea and fatigue. Apparently he stopped taking furosemide 2 months ago due to polyuria.  On his initial physical examination his blood pressure was 170/75, HR 76, RR 18 and 02 saturation 98% on room air, lungs with no wheezing or rhonchi, heart with S1 and S2 present and regular with no gallops, rubs or murmurs, abdomen with no distention and positive lower extremity edema.   Chest radiograph with bilateral hilar vascular congestion with bilateral central interstitial infiltrates, with fluid in the right fissure and small bilateral pleural effusions.   11/24 improving volume status with diuresis.  Abdominal radiograph with dilatated left upper quadrant small bowel loop.   11/25 improved volume status. Transition to oral loop diuretic in am.   Assessment and Plan: * Acute on chronic diastolic CHF (congestive heart failure) (HCC) Echocardiogram with preserved LV systolic function with EF 60 to 65%, mild LVH, RV systolic function preserved, LA and RA with mild dilatation, no significant valvular disease.  Urine output is 1,800 ml  Systolic blood pressure 119 mmHg, dropped to 97 mmHg.   Plan to continue medical therapy with carvedilol, and spironolactone.  Holding on hydralazine due to hypotension.   Troponin elevation due to heart failure, ruled out for acute coronary syndrome.   PAF (paroxysmal atrial fibrillation) (HCC) Continue carvedilol for rate control.  Patient not on  anticoagulation due to history of gastro intestinal bleeding.   CKD stage 3b, GFR 30-44 ml/min (HCC) Renal function with serum cr at 1,93 with K at 3,7 and serum bicarbonate at 26.  Na 139   Add 40 meq Kcl x1  Follow up renal function and electrolytes in am.   Hypertension Hold on hydralazine and isosorbide due to hypotension.  Continue with carvedilol  Continue close follow up on blood pressure.   Carotid artery disease (HCC) Continue antiplatelet therapy and statin   Hyperlipidemia Continue statin and ezetimibe therapy.   Gastritis Improved abdominal pain with antiacid therapy. Continue with po pantoprazole.  Abdomen radiograph with small bowel loop dilatation.  No signs of bowel obstruction, tolerating po well.         Subjective: Patient is feeling better, lower extremity edema and dyspnea have improved.   Physical Exam: Vitals:   03/13/23 1206 03/13/23 1341 03/13/23 1342 03/13/23 1346  BP: (!) 90/48 (!) 79/56 (!) 78/49 (!) 97/59  Pulse: 83     Resp: 16 16 20 20   Temp:      TempSrc:      SpO2:      Weight:      Height:       Neurology awake and alert ENT with mild pallor Cardiovascular with S1 and S2 present and regular with no gallops, rubs or murmurs Respiratory with no rales or wheezing, no rhonchi Abdomen with no distention  Trace lower extremity edema, ted hose in place  Data Reviewed:    Family Communication: no family at the bedside   Disposition: Status is: Inpatient Remains inpatient appropriate because: hypotension   Planned Discharge Destination: Home  Author: Coralie Keens, MD 03/13/2023 3:13 PM  For on call review [email or web address].

## 2023-03-13 NOTE — Progress Notes (Signed)
   03/13/23 1341  Vitals  BP (!) 79/56  MAP (mmHg) (!) 64  BP Location Right Arm  BP Method Automatic  Patient Position (if appropriate) Sitting  ECG Heart Rate 97  Resp 16  MEWS COLOR  MEWS Score Color Yellow  MEWS Score  MEWS Temp 0  MEWS Systolic 2  MEWS Pulse 0  MEWS RR 0  MEWS LOC 0  MEWS Score 2   RN rounded and obtained BP per patient's request. Patient asymptomatic sitting in recliner. RN assisted patient back to bed. EKG obtained. Call bell within reach. Cards and [name] MD made aware. Lying BP is 97. Hydralazine PO discontinue.

## 2023-03-13 NOTE — Progress Notes (Signed)
Heart Failure Navigator Progress Note  Assessed for Heart & Vascular TOC clinic readiness.  Patient does not meet criteria due to EF 60-65%, No HF TOC per Dr. Ella Jubilee, plan to follow up with Dr. Allyson Sabal. .   Navigator will sign off at this time.   Rhae Hammock, BSN, Scientist, clinical (histocompatibility and immunogenetics) Only

## 2023-03-13 NOTE — Care Management Important Message (Signed)
Important Message  Patient Details  Name: Billy [name] MRN: [id number] Date of Birth: 1950/10/08   Important Message Given:  Yes - Medicare IM     Dorena Bodo 03/13/2023, 3:24 PM

## 2023-03-13 NOTE — Progress Notes (Signed)
   03/13/23 1206  Vitals  BP (!) 90/48  MAP (mmHg) (!) 62  BP Location Right Arm  BP Method Automatic  Patient Position (if appropriate) Sitting  Pulse Rate 83  Pulse Rate Source Monitor  ECG Heart Rate 83  Resp 16  MEWS COLOR  MEWS Score Color Green  MEWS Score  MEWS Temp 0  MEWS Systolic 1  MEWS Pulse 0  MEWS RR 0  MEWS LOC 0  MEWS Score 1   NT notified RN that patient SBP has been trending 90s. Cards at bedside and notified. Patient is asymptomatic. Cards modified hydralazine PO and discontinued imdur.

## 2023-03-13 NOTE — Progress Notes (Signed)
Mobility Specialist Progress Note:    03/13/23 1051  Mobility  Activity Ambulated with assistance in hallway  Level of Assistance Contact guard assist, steadying assist  Assistive Device Other (Comment) (HHA)  Distance Ambulated (ft) 400 ft  Activity Response Tolerated well  Mobility Referral Yes  $Mobility charge 1 Mobility  Mobility Specialist Start Time (ACUTE ONLY) 1000  Mobility Specialist Stop Time (ACUTE ONLY) 1012  Mobility Specialist Time Calculation (min) (ACUTE ONLY) 12 min   Pt received in chair agreeable to mobility. No physical assistance needed. Pt attempted to ambulated w/o an AD but pt states he felt "off" requesting a HHA. Unable to identify the problem. Returned to room w/o fault. Left in chair w/ call bell and personal belongings in reach. All needs met.  Thompson Grayer Mobility Specialist  Please contact vis Secure Chat or  Rehab Office [phone]

## 2023-03-13 NOTE — Progress Notes (Addendum)
Rounding Note    Patient Name: Billy [name] Date of Encounter: 03/13/2023  [facility] Cardiologist: Nanetta Batty, MD   Subjective   No acute overnight events. He still reports some shortness of breath but improved from when he came in. He still has some lower extremity edema but this has also improved. No chest pain or palpitations.  Inpatient Medications    Scheduled Meds:  aspirin EC  81 mg Oral Daily   atorvastatin  80 mg Oral Q supper   carvedilol  25 mg Oral BID   clopidogrel  75 mg Oral Daily   ezetimibe  10 mg Oral Q supper   furosemide  80 mg Intravenous Q12H   heparin  5,000 Units Subcutaneous Q8H   hydrALAZINE  100 mg Oral TID   isosorbide mononitrate  30 mg Oral Daily   pantoprazole  40 mg Oral Daily   sodium chloride flush  3 mL Intravenous Q12H   sodium chloride flush  3 mL Intravenous Q12H   spironolactone  12.5 mg Oral Daily   Continuous Infusions:  sodium chloride     PRN Meds: sodium chloride, acetaminophen **OR** acetaminophen, albuterol, bisacodyl, naLOXone (NARCAN)  injection, ondansetron **OR** ondansetron (ZOFRAN) IV, oxyCODONE, senna-docusate, sodium chloride flush, sodium phosphate, traZODone   Vital Signs    Vitals:   03/13/23 0308 03/13/23 0507 03/13/23 0758 03/13/23 0901  BP:  (!) 147/80 (!) 169/83 119/66  Pulse:  74 72 100  Resp: 13 (!) 21 15 18   Temp:  98 F (36.7 C) (!) 97.4 F (36.3 C)   TempSrc:  Oral Oral   SpO2:  92% 96%   Weight: 86.3 kg     Height:        Intake/Output Summary (Last 24 hours) at 03/13/2023 1117 Last data filed at 03/13/2023 0904 Gross per 24 hour  Intake 356 ml  Output 1845 ml  Net -1489 ml      03/13/2023    3:08 AM 03/12/2023    5:00 AM 03/11/2023    4:51 AM  Last 3 Weights  Weight (lbs) 190 lb 4.1 oz 192 lb 7.4 oz 198 lb 3.1 oz  Weight (kg) 86.3 kg 87.3 kg 89.9 kg      Telemetry    Normal sinus rhythm with baseline rates in the 70s to 80s with intermittent episodes of a narrow  complex tachycardia with rates in the low 100s (questionable atrial flutter). - Personally Reviewed  ECG    No new ECG tracings today.  - Personally Reviewed  Physical Exam   GEN: No acute distress.   Neck: Possibly elevated JVD. Cardiac: Borderline tachycardia. No murmurs, rubs, or gallops.  Respiratory: No increased work of breathing. Decreased breaths sounds bilaterally. No significant wheezes, rhonchi, or rales.   GI: Soft, non-distended, and non-tender. MS: 1+ pitting edema of bilateral lower extremities. No deformity. Skin: Warm and dry. Neuro:  No focal deficits. Psych: Normal affect. Responds appropriately.  Labs    High Sensitivity Troponin:   Recent Labs  Lab 03/09/23 2348 03/10/23 0810 03/10/23 1018 03/10/23 1323 03/10/23 1852  TROPONINIHS 81* 110* 117* 115* 88*     Chemistry Recent Labs  Lab 03/10/23 1018 03/11/23 0218 03/12/23 0242 03/13/23 0333  NA  --  142 143 139  K  --  3.5 3.7 3.7  CL  --  108 104 103  CO2  --  25 25 26   GLUCOSE  --  114* 117* 106*  BUN  --  24* 25*  27*  CREATININE  --  2.12* 2.09* 1.93*  CALCIUM  --  8.8* 9.3 9.3  MG 2.0 2.0 1.9  --   GFRNONAA  --  33* 33* 37*  ANIONGAP  --  9 14 10     Lipids  Recent Labs  Lab 03/10/23 1324  CHOL 107  TRIG 72  HDL 38*  LDLCALC 55  CHOLHDL 2.8    Hematology Recent Labs  Lab 03/09/23 2348 03/11/23 0218 03/13/23 0333  WBC 7.4 8.1 8.2  RBC 4.62 4.28 4.61  HGB 12.1* 11.4* 12.3*  HCT 38.9* 36.0* 38.9*  MCV 84.2 84.1 84.4  MCH 26.2 26.6 26.7  MCHC 31.1 31.7 31.6  RDW 15.0 15.0 15.0  PLT 216 221 212   Thyroid  Recent Labs  Lab 03/10/23 1324  TSH 1.944    BNP Recent Labs  Lab 03/09/23 1544 03/11/23 0218  BNP 660.9* 491.3*    DDimer No results for input(s): "DDIMER" in the last 168 hours.   Radiology    DG Abd 1 View  Result Date: 03/12/2023 CLINICAL DATA:  Abdominal pain EXAM: ABDOMEN - 1 VIEW COMPARISON:  None Available. FINDINGS: Left upper quadrant small bowel  loop is dilated measuring 3.5 cm. Gas and stool noted within normal caliber colon with gas up to the level of the rectum. IMPRESSION: Dilated left upper quadrant small bowel loop. Cannot exclude partial small bowel obstruction versus focal small bowel ileus. Electronically Signed   By: Signa Kell M.D.   On: 03/12/2023 05:50    Cardiac Studies   Echocardiogram 03/10/2023: Impressions:  1. Left ventricular ejection fraction, by estimation, is 60 to 65%. The  left ventricle has normal function. The left ventricle has no regional  wall motion abnormalities. There is mild left ventricular hypertrophy.  Left ventricular diastolic parameters  were normal.   2. Right ventricular systolic function is normal. The right ventricular  size is normal. Tricuspid regurgitation signal is inadequate for assessing  PA pressure.   3. Left atrial size was mildly dilated.   4. Right atrial size was mildly dilated.   5. The mitral valve was not well visualized. Trivial mitral valve  regurgitation. No evidence of mitral stenosis.   6. The aortic valve is tricuspid. There is mild calcification of the  aortic valve. Aortic valve regurgitation is not visualized. Aortic valve  sclerosis is present, with no evidence of aortic valve stenosis.    Patient Profile     72 y.o. male with a history of CAD with NSTEMI in 03/2018 (cardiac catheterization was not preformed due to GI bleed and renal function and he was treated medically), paroxysmal atrial fibrillation not on anticoagulation due to prior GI bleed, TIA, carotid artery disease s/p left CEA in 07/2022 and right TCAR in 09/2022, DVT, hypertension, hyperlipidemia, CKD stage IIIb, gastritis, and anemia who was admitted on 03/09/2023 for acute diastolic CHF after presenting with dyspnea and lower extremity edema.   Assessment & Plan    Acute Diastolic CHF Patient presented with dyspnea and lower extremity edema in setting of not taking home Lasix due to increased  urination. BNP elevated at 491. Chest x-ray showed finding suggestive of pulmonary vascular congestion with small bilateral pleural effusion (L > R). Echo showed LVEF of 60-65% with mild LVH and normal diastolic parameters as well as normal RV function. He was started on IV Lasix. Documented urinary output of 1.8L yesterday and net negative 9.196L this admission. Weight down 8 lbs from admission. Renal function stable. -  Still looks mildly volume overloaded this morning. However, BP has dropped and systolic BP is in the low 90s. - Currently on IV Lasix 80mg  twice daily. Will discuss decreased to IV Lasix 40mg  twice daily or switching to PO with MD. - Continue Spironolactone 12.5mg  daily.  - Continue to monitor daily weights, strict I/Os, and renal function.   Paroxysmal Atrial Fibrillation Initially noted on loop recorder after TIA in 11/2017. He was initially started on Eliquis but this was subsequently stopped in setting of a GI bleed in 03/2018 due to gastric ulcer. Last interrogation of loop recorder in 2021 showed very low atrial fibrillation burden.  - Telemetry shows an intermittent atrial tachycardia with rates in the low 100s. Questionable atrial flutter. We will try to catch this on a 12-lead EKG. - Continue Coreg 25mg  twice daily. - CHA2DS2-VASc = 6 (CHF, HTN, TIA x2, vascular disease, age). Not on anticoagulation given prior GI bleed on Eliquis several years ago. He denies any recent GI bleeds and no melena or hematochezia. He is also on DAPT and is doing well with this. If EKG confirms atrial flutter, may need to consider starting Eliquis.  Elevated Troponin CAD Mild coronary calcifications noted on prior CT in 2019. He was admitted with a GI bleed in 03/2018 and ruled in for NSTEMI at that time. However, cardiac catheterization was not performed due to GI bleed and renal function. High-sensitivity troponin this admission mildly elevated and peaked at 117 before trending down. Echo showed  LVEF of 60-65% with no regional wall motion abnormalities. - No chest pain.  - Continue Coreg 25mg  twice daily.  - Will stop Imdur given soft BP this morning. - On DAPT with Aspirin and Plavix for his carotid artery disease.  - Continue high-intensity statin and Zetia. - Troponin elevation consistent with demand ischemia in setting of acute CHF. No ischemic work-up necessary at this time.  Carotid Artery Disease History of bilateral carotid stenosis s/p left CEA in 07/2022 and right TCAR in 09/2022.  - On DAPT with Aspirin and Plavix. - Continue statin and Zetia. - Followed by Vascular Surgery.  Hypertension BP soft with morning with systolic BP in the low 90s.  - Continue Coreg 25mg  twice daily. - Will decrease Hydralazine to 50mg  three times daily. - Will stop Imdur 30mg  daily.   Hyperlipidemia Lipid panel this admission: Total Cholesterol 107, Triglycerides 72, HDL 38, LDL 55.  - Continue Lipitor 80mg  daily and Zetia 10mg  daily.  CKD Stage IIIb Creatinine 1.85 on admission and peaked at 2.12 on 03/11/2023. Baseline around 2.1. Slowly down-trending - 1.93 today. - Continue to monitor closely especially with diuresis.   Abdominal Pain Abdominal x-ray on 03/12/2023 showed dilated LUQ small bowel loop. Partial SBO vs focal small bowel ileus could not be excluded. - Abdominal pain improved today. - Management per primary team.    For questions or updates, please contact [facility] Please consult [email or web address] for contact info under        Signed, Corrin Parker, PA-C  03/13/2023, 11:17 AM    Patient seen, examined. Available data reviewed. Agree with findings, assessment, and plan as outlined by Marjie Skiff, PA-C.  The patient is independently interviewed and examined.  He is alert, oriented, in no distress.  HEENT normal, JVP normal, lungs clear bilaterally, heart is regular rate and rhythm without murmur or gallop, abdomen is soft and nontender, extremities  have 1+ pitting in the pretibial regions bilaterally, skin is warm and dry with no  rash.  All available data is reviewed.  Patient with stage IIIb chronic kidney disease which appears stable based on review of his creatinine values, today at 1.93.  He has diuresed well and reports that he had edema up into his thighs at the time of presentation.  His weight is down 8 pounds from admission.  Echo reviewed and shows normal LVEF and normal RV function.  Will transition to oral diuretic therapy as his blood pressure has become softer.  Continue carvedilol, decrease hydralazine, and stop isosorbide.  Follow blood pressure trend.  I reviewed his telemetry as detailed above and he is having heart rates that suddenly jumped to 100 bpm raising some suspicion for the possibility of atrial tachycardia or atrial flutter.  Will try to capture this on a twelve-lead EKG.  I do not see clear flutter waves on his telemetry review.  Otherwise as outlined above.  Tonny Bollman, M.D. 03/13/2023 12:35 PM

## 2023-03-13 NOTE — Progress Notes (Signed)
   Notified by RN around 1:45pm that systolic BP dropped to the high 70s when sitting up in the chair. Improved to high 90s when he laid back in bed. He was only minimally symptomatic. Therefore, no IV fluids were given. IV Lasix and Imdur was stopped earlier today and Hydralazine was decreased. Will stop Hydralazine completely. Will continue Coreg given intermittent tachycardia but will place hold parameters.   EKG showed normal sinus rhythm -  no evidence of atrial flutter.   BP improved to 108/58 around 3:15pm and patient asymptomatic per RN.   Corrin Parker, PA-C 03/13/2023 4:49 PM

## 2023-03-13 NOTE — Plan of Care (Signed)
Problem: Activity: Goal: Capacity to carry out activities will improve Outcome: Progressing   Problem: Cardiac: Goal: Ability to achieve and maintain adequate cardiopulmonary perfusion will improve Outcome: Progressing

## 2023-03-13 NOTE — Telephone Encounter (Signed)
Patient Product/process development scientist completed.    The patient is insured through [employer]/ [employer]. Patient has Medicare and is not eligible for a copay card, but may be able to apply for patient assistance, if available.    Ran test claim for Farxiga 10 mg and the current 30 day co-pay is $47.00.  Ran test claim for Jardiance 10 mg and the current 30 day co-pay is $47.00.  This test claim was processed through [facility]- copay amounts may vary at other pharmacies due to pharmacy/plan contracts, or as the patient moves through the different stages of their insurance plan.     Roland Earl, CPHT Pharmacy Technician III Certified Patient Advocate [facility] Patient Advocate Team Direct Number: [phone]  Fax: [phone]

## 2023-03-14 ENCOUNTER — Other Ambulatory Visit (HOSPITAL_COMMUNITY): Payer: Self-pay

## 2023-03-14 DIAGNOSIS — I5033 Acute on chronic diastolic (congestive) heart failure: Secondary | ICD-10-CM | POA: Diagnosis not present

## 2023-03-14 DIAGNOSIS — N1832 Chronic kidney disease, stage 3b: Secondary | ICD-10-CM | POA: Diagnosis not present

## 2023-03-14 DIAGNOSIS — I10 Essential (primary) hypertension: Secondary | ICD-10-CM | POA: Diagnosis not present

## 2023-03-14 DIAGNOSIS — I480 Paroxysmal atrial fibrillation: Secondary | ICD-10-CM | POA: Diagnosis not present

## 2023-03-14 DIAGNOSIS — K2970 Gastritis, unspecified, without bleeding: Secondary | ICD-10-CM

## 2023-03-14 LAB — BASIC METABOLIC PANEL
Anion gap: 8 (ref 5–15)
BUN: 38 mg/dL — ABNORMAL HIGH (ref 8–23)
CO2: 28 mmol/L (ref 22–32)
Calcium: 9 mg/dL (ref 8.9–10.3)
Chloride: 106 mmol/L (ref 98–111)
Creatinine, Ser: 2.44 mg/dL — ABNORMAL HIGH (ref 0.61–1.24)
GFR, Estimated: 28 mL/min — ABNORMAL LOW (ref >60)
Glucose, Bld: 111 mg/dL — ABNORMAL HIGH (ref 70–99)
Potassium: 4.4 mmol/L (ref 3.5–5.1)
Sodium: 142 mmol/L (ref 135–145)

## 2023-03-14 LAB — GLUCOSE, CAPILLARY: Glucose-Capillary: 113 mg/dL — ABNORMAL HIGH (ref 70–99)

## 2023-03-14 LAB — MAGNESIUM: Magnesium: 2.3 mg/dL (ref 1.7–2.4)

## 2023-03-14 MED ORDER — HYDRALAZINE HCL 50 MG PO TABS
50.0000 mg | ORAL_TABLET | Freq: Three times a day (TID) | ORAL | 0 refills | Status: DC
  Filled 2023-03-14: qty 90, 30d supply, fill #0

## 2023-03-14 MED ORDER — PANTOPRAZOLE SODIUM 40 MG PO TBEC
40.0000 mg | DELAYED_RELEASE_TABLET | Freq: Every day | ORAL | 0 refills | Status: DC
  Filled 2023-03-14: qty 30, 30d supply, fill #0

## 2023-03-14 MED ORDER — HYDRALAZINE HCL 100 MG PO TABS: 50.0000 mg | ORAL_TABLET | Freq: Three times a day (TID) | ORAL | Status: DC

## 2023-03-14 MED ORDER — SPIRONOLACTONE 25 MG PO TABS
12.5000 mg | ORAL_TABLET | Freq: Every day | ORAL | 0 refills | Status: DC
  Filled 2023-03-14: qty 15, 30d supply, fill #0

## 2023-03-14 MED ORDER — HYDRALAZINE HCL 50 MG PO TABS: 50.0000 mg | ORAL_TABLET | Freq: Three times a day (TID) | ORAL | Status: DC

## 2023-03-14 MED ORDER — FUROSEMIDE 40 MG PO TABS
40.0000 mg | ORAL_TABLET | Freq: Every day | ORAL | 0 refills | Status: DC
  Filled 2023-03-14: qty 30, 30d supply, fill #0

## 2023-03-14 NOTE — Progress Notes (Signed)
Discharge instructions (including medications) discussed with and copy provided to patient/caregiver, TOC medication delivered top patients room.

## 2023-03-14 NOTE — Discharge Summary (Addendum)
Physician Discharge Summary   Patient: Billy [name] MRN: [id number] DOB: 18-Apr-1951  Admit date:     03/09/2023  Discharge date: 03/14/23  Discharge Physician: York Ram Krystyn Picking   PCP: [name], MD   Recommendations at discharge:    Patient has been placed on furosemide 40 mg po daily Added spironolactone for mineralocorticoid receptor antagonist.  Decreased hydralazine to 50 mg po tid, discontinue isosorbide to prevent hypotension.  If renal function and blood pressure stable plan to start patient on ARB or ace inh as outpatient.  Trial of pantoprazole for gastritis.  Advice to stay adherent to his medical therapy, elevate his legs when seated and to use compression socks as tolerated.  Home health services arranged.  Follow up renal function and electrolytes in 7 days as outpatient.  Follow up with Dr Azucena Cecil in 7 to 10 days.  Follow up with Cardiology as scheduled.   Discharge Diagnoses: Principal Problem:   Acute on chronic diastolic CHF (congestive heart failure) (HCC) Active Problems:   PAF (paroxysmal atrial fibrillation) (HCC)   CKD stage 3b, GFR 30-44 ml/min (HCC)   Hypertension   Carotid artery disease (HCC)   Hyperlipidemia   Gastritis  Resolved Problems:   * No resolved hospital problems. [facility] Course: [name] was admitted to the hospital with the working diagnosis of heart failure exacerbation.   72 yo male with the past medical history of heart failure, CKD, coronary artery disease, hyperlipidemia, and paroxysmal atrial fibrillation who presented with dyspnea. Reported worsening dyspnea and progressive lower extremity edema. Positive orthopnea and fatigue. Apparently he stopped taking furosemide 2 months ago due to polyuria.  On his initial physical examination his blood pressure was 170/75, HR 76, RR 18 and 02 saturation 98% on room air, lungs with no wheezing or rhonchi, heart with S1 and S2 present and regular with no gallops, rubs or murmurs,  abdomen with no distention and positive lower extremity edema.   Na 141, K 3,5 Cl 111, bicarbonate 16, glucose 87, bun 18 and cr 1.85  BNP 660  High sensitive troponin 53, 63, 81 and 110  Wbc 7,4 hgb 12.1 plt 216  Sars covid 19 negative   Chest radiograph with bilateral hilar vascular congestion with bilateral central interstitial infiltrates, with fluid in the right fissure and small bilateral pleural effusions.   EKG 76 bpm, normal axis, normal intervals, sinus rhythm with no significant ST segment changes, negative T waves lead II, III, aVF, V4 to V6. (No new changes).   11/24 improving volume status with diuresis.  Abdominal radiograph with dilatated left upper quadrant small bowel loop.  11/25 improved volume status. Transition to oral loop diuretic in am.  11/26 volume status has improved, patient will continue diuresis with oral furosemide and will need close follow up as outpatient.   Assessment and Plan: * Acute on chronic diastolic CHF (congestive heart failure) (HCC) Echocardiogram with preserved LV systolic function with EF 60 to 65%, mild LVH, RV systolic function preserved, LA and RA with mild dilatation, no significant valvular disease.  Patient was placed on furosemide for diuresis, negative fluid balance was achieved, -9,500 ml, with significant improvement in his symptoms.   Patient had one episode of hypotension, hydralazine dose has been decreased and will hold on isosorbide. .   Plan to continue medical therapy with carvedilol, and spironolactone.  Oral furosemide.  If renal function and blood pressure stable as outpatient, plan to start patient on ARB or ace inh.   Troponin  elevation due to heart failure, ruled out for acute coronary syndrome.   PAF (paroxysmal atrial fibrillation) (HCC) Continue carvedilol for rate control.  Patient not on anticoagulation due to history of gastro intestinal bleeding.   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    Hypertension Continue with carvedilol and lower dose of hydralazine.  Holding on isosorbide.  Carotid artery disease (HCC) Continue antiplatelet therapy and statin   Hyperlipidemia Continue statin and ezetimibe therapy.   Gastritis Improved abdominal pain with antiacid therapy. Continue with po pantoprazole.  Abdomen radiograph with small bowel loop dilatation.  No signs of bowel obstruction, tolerating po well.        Consultants: cardiology  Procedures performed: none   Disposition: Home Diet recommendation:  Discharge Diet Orders (From admission, onward)     Start     Ordered   03/14/23 0000  Diet - low sodium heart healthy        03/14/23 1303           Cardiac diet DISCHARGE MEDICATION: Allergies as of 03/14/2023       Reactions   Amlodipine Swelling   Excessive swelling and skin blotching    Morphine And Codeine Itching, Other (See Comments)   "Cannot handle it"        Medication List     STOP taking these medications    isosorbide mononitrate 30 MG 24 hr tablet Commonly known as: IMDUR   predniSONE 20 MG tablet Commonly known as: DELTASONE       TAKE these medications    aspirin EC 81 MG tablet Take 1 tablet (81 mg total) by mouth daily. Swallow whole.   atorvastatin 80 MG tablet Commonly known as: LIPITOR Take 1 tablet (80 mg total) by mouth daily with supper.   carvedilol 25 MG tablet Commonly known as: COREG Take 25 mg by mouth 2 (two) times daily.   clopidogrel 75 MG tablet Commonly known as: PLAVIX TAKE 1 TABLET BY MOUTH EVERY DAY   ezetimibe 10 MG tablet Commonly known as: Zetia Take 1 tablet (10 mg total) by mouth daily. What changed: when to take this   fluticasone 50 MCG/ACT nasal spray Commonly known as:  FLONASE Place 1 spray into both nostrils 2 (two) times daily.   furosemide 40 MG tablet Commonly known as: LASIX Take 1 tablet (40 mg total) by mouth daily. Start taking on: March 15, 2023   hydrALAZINE 50 MG tablet Commonly known as: APRESOLINE Take 1 tablet (50 mg total) by mouth every 8 (eight) hours. Start taking on: March 15, 2023 What changed:  medication strength how much to take when to take this   pantoprazole 40 MG tablet Commonly known as: PROTONIX Take 1 tablet (40 mg total) by mouth daily. Start taking on: March 15, 2023   spironolactone 25 MG tablet Commonly known as: ALDACTONE Take 0.5 tablets (12.5 mg total) by mouth daily. Start taking on: March 15, 2023        Follow-up Information     Schedule an appointment as soon as possible for a visit  with [name], MD.   Specialty: Family Medicine Contact information: [phone] [address] [place] [address] [phone]  Discharge Exam: Filed Weights   03/12/23 0500 03/13/23 0308 03/14/23 0513  Weight: 87.3 kg 86.3 kg 86 kg   BP (!) 147/73 (BP Location: Right Arm)   Pulse 68   Temp 97.7 F (36.5 C) (Oral)   Resp 19   Ht 6\' 2"  (1.88 m)   Wt 86 kg   SpO2 96%   BMI 24.34 kg/m   Patient is feeling better, no dyspnea, no chest pain, no PND or orthopnea, no lower extremity edema  ENT with no pallor Cardiovascular with S1 and S2 present and regular with no gallops, rubs or murmurs Respiratory with no rales or wheezing, no rhonchi Abdomen with no distention  Trace lower extremity edema ( Ted hose in place)   Condition at discharge: stable  The results of significant diagnostics from this hospitalization (including imaging, microbiology, ancillary and laboratory) are listed below for reference.   Imaging Studies: DG Abd 1 View  Result Date: 03/12/2023 CLINICAL DATA:  Abdominal pain EXAM: ABDOMEN - 1 VIEW COMPARISON:  None Available. FINDINGS: Left  upper quadrant small bowel loop is dilated measuring 3.5 cm. Gas and stool noted within normal caliber colon with gas up to the level of the rectum. IMPRESSION: Dilated left upper quadrant small bowel loop. Cannot exclude partial small bowel obstruction versus focal small bowel ileus. Electronically Signed   By: Signa Kell M.D.   On: 03/12/2023 05:50   ECHOCARDIOGRAM COMPLETE  Result Date: 03/10/2023    ECHOCARDIOGRAM REPORT   Patient Name:   Burman Bader Date of Exam: 03/10/2023 Medical Rec #:  [id number]  Height:       74.0 in Accession #:    [id number] Weight:       198.4 lb Date of Birth:  07-22-1950  BSA:          2.166 m Patient Age:    72 years   BP:           179/94 mmHg Patient Gender: M          HR:           96 bpm. Exam Location:  Inpatient Procedure: 2D Echo, Color Doppler and Cardiac Doppler Indications:    CP  History:        Patient has prior history of Echocardiogram examinations, most                 recent 06/29/2022. CHF, Arrythmias:Atrial Fibrillation; Risk                 Factors:Hypertension and Dyslipidemia. CKD.  Sonographer:    Vern Claude Referring Phys: Kendell Bane IMPRESSIONS  1. Left ventricular ejection fraction, by estimation, is 60 to 65%. The left ventricle has normal function. The left ventricle has no regional wall motion abnormalities. There is mild left ventricular hypertrophy. Left ventricular diastolic parameters were normal.  2. Right ventricular systolic function is normal. The right ventricular size is normal. Tricuspid regurgitation signal is inadequate for assessing PA pressure.  3. Left atrial size was mildly dilated.  4. Right atrial size was mildly dilated.  5. The mitral valve was not well visualized. Trivial mitral valve regurgitation. No evidence of mitral stenosis.  6. The aortic valve is tricuspid. There is mild calcification of the aortic valve. Aortic valve regurgitation is not visualized. Aortic valve sclerosis is present, with no evidence of aortic  valve stenosis. Comparison(s): No significant change from prior study. Conclusion(s)/Recommendation(s): Otherwise normal echocardiogram, with minor abnormalities described in the report. FINDINGS  Left Ventricle: Left ventricular ejection fraction, by estimation, is 60 to 65%. The left ventricle has normal function. The left ventricle has no regional wall motion abnormalities. The left ventricular internal cavity size was normal in size. There is  mild left ventricular hypertrophy. Left ventricular diastolic parameters were normal. Right Ventricle: The right ventricular size is normal. Right vetricular wall thickness was not well visualized. Right ventricular systolic function is normal. Tricuspid regurgitation signal is inadequate for assessing PA pressure. Left Atrium: Left atrial size was mildly dilated. Right Atrium: Right atrial size was mildly dilated. Pericardium: There is no evidence of pericardial effusion. Mitral Valve: The mitral valve was not well visualized. Trivial mitral valve regurgitation. No evidence of mitral valve stenosis. MV peak gradient, 2.1 mmHg. The mean mitral valve gradient is 1.0 mmHg. Tricuspid Valve: The tricuspid valve is not well visualized. Tricuspid valve regurgitation is trivial. No evidence of tricuspid stenosis. Aortic Valve: The aortic valve is tricuspid. There is mild calcification of the aortic valve. Aortic valve regurgitation is not visualized. Aortic valve sclerosis is present, with no evidence of aortic valve stenosis. Aortic valve mean gradient measures 3.0 mmHg. Aortic valve peak gradient measures 5.7 mmHg. Aortic valve area, by VTI measures 3.58 cm. Pulmonic Valve: The pulmonic valve was not well visualized. Pulmonic valve regurgitation is trivial. No evidence of pulmonic stenosis. Aorta: The aortic root and ascending aorta are structurally normal, with no evidence of dilitation. IAS/Shunts: The interatrial septum was not well visualized. Additional Comments: There is  a small pleural effusion in the left lateral region.  LEFT VENTRICLE PLAX 2D LVIDd:         4.20 cm      Diastology LVIDs:         2.80 cm      LV e' medial:    5.33 cm/s LV PW:         1.20 cm      LV E/e' medial:  13.3 LV IVS:        1.10 cm      LV e' lateral:   12.30 cm/s LVOT diam:     1.90 cm      LV E/e' lateral: 5.8 LV SV:         61 LV SV Index:   28 LVOT Area:     2.84 cm  LV Volumes (MOD) LV vol d, MOD A2C: 168.0 ml LV vol d, MOD A4C: 168.0 ml LV vol s, MOD A2C: 70.0 ml LV vol s, MOD A4C: 63.6 ml LV SV MOD A2C:     98.0 ml LV SV MOD A4C:     168.0 ml LV SV MOD BP:      100.7 ml RIGHT VENTRICLE RV Basal diam:  3.20 cm RV S prime:     13.50 cm/s TAPSE (M-mode): 2.5 cm LEFT ATRIUM             Index        RIGHT ATRIUM           Index LA Vol (A2C):   57.5 ml 26.55 ml/m  RA Area:     15.40 cm LA Vol (A4C):   54.2 ml 25.02 ml/m  RA Volume:   43.70 ml  20.17 ml/m LA Biplane Vol: 62.1 ml 28.67 ml/m  AORTIC VALVE                    PULMONIC VALVE AV Area (Vmax):    2.74 cm     PV  Vmax:       1.11 m/s AV Area (Vmean):   2.36 cm     PV Peak grad:  4.9 mmHg AV Area (VTI):     3.58 cm AV Vmax:           119.00 cm/s AV Vmean:          80.300 cm/s AV VTI:            0.171 m AV Peak Grad:      5.7 mmHg AV Mean Grad:      3.0 mmHg LVOT Vmax:         115.00 cm/s LVOT Vmean:        66.800 cm/s LVOT VTI:          0.216 m LVOT/AV VTI ratio: 1.26  AORTA Ao Root diam: 3.20 cm Ao Asc diam:  3.00 cm MITRAL VALVE MV Area (PHT): 3.91 cm    SHUNTS MV Area VTI:   4.38 cm    Systemic VTI:  0.22 m MV Peak grad:  2.1 mmHg    Systemic Diam: 1.90 cm MV Mean grad:  1.0 mmHg MV Vmax:       0.72 m/s MV Vmean:      56.2 cm/s MV Decel Time: 194 msec MV E velocity: 70.80 cm/s MV A velocity: 92.60 cm/s MV E/A ratio:  0.76 Jodelle Red MD Electronically signed by Jodelle Red MD Signature Date/Time: 03/10/2023/6:26:40 PM    Final    DG Chest 2 View  Result Date: 03/09/2023 CLINICAL DATA:  Shortness of breath.  EXAM: CHEST - 2 VIEW COMPARISON:  Chest radiograph dated March 09, 2023. FINDINGS: Stable cardiomediastinal silhouette. Aortic atherosclerosis. Implantable loop recorder in place. Bilateral interstitial prominence. Similar small left-greater-than-right pleural effusions. No pneumothorax. No acute osseous abnormality. IMPRESSION: Findings suggestive of pulmonary vascular congestion with small left-greater-than-right pleural effusions, similar to the prior exam. Electronically Signed   By: Hart Robinsons M.D.   On: 03/09/2023 15:56    Microbiology: Results for orders placed or performed during the hospital encounter of 03/09/23  Resp panel by RT-PCR (RSV, Flu A&B, Covid) Anterior Nasal Swab     Status: None   Collection Time: 03/09/23  3:44 PM   Specimen: Anterior Nasal Swab  Result Value Ref Range Status   SARS Coronavirus 2 by RT PCR NEGATIVE NEGATIVE Final   Influenza A by PCR NEGATIVE NEGATIVE Final   Influenza B by PCR NEGATIVE NEGATIVE Final    Comment: (NOTE) The Xpert Xpress SARS-CoV-2/FLU/RSV plus assay is intended as an aid in the diagnosis of influenza from Nasopharyngeal swab specimens and should not be used as a sole basis for treatment. Nasal washings and aspirates are unacceptable for Xpert Xpress SARS-CoV-2/FLU/RSV testing.  Fact Sheet for Patients: BloggerCourse.com  Fact Sheet for Healthcare Providers: SeriousBroker.it  This test is not yet approved or cleared by the [place] FDA and has been authorized for detection and/or diagnosis of SARS-CoV-2 by FDA under an Emergency Use Authorization (EUA). This EUA will remain in effect (meaning this test can be used) for the duration of the COVID-19 declaration under Section 564(b)(1) of the Act, 21 U.S.C. section 360bbb-3(b)(1), unless the authorization is terminated or revoked.     Resp Syncytial Virus by PCR NEGATIVE NEGATIVE Final    Comment: (NOTE) Fact Sheet  for Patients: BloggerCourse.com  Fact Sheet for Healthcare Providers: SeriousBroker.it  This test is not yet approved or cleared by the [place] FDA and has been authorized for detection and/or  diagnosis of SARS-CoV-2 by FDA under an Emergency Use Authorization (EUA). This EUA will remain in effect (meaning this test can be used) for the duration of the COVID-19 declaration under Section 564(b)(1) of the Act, 21 U.S.C. section 360bbb-3(b)(1), unless the authorization is terminated or revoked.  Performed at [facility], [address] [place], [place] [address]     Labs: CBC: Recent Labs  Lab 03/09/23 2348 03/11/23 0218 03/13/23 0333  WBC 7.4 8.1 8.2  NEUTROABS 5.5  --   --   HGB 12.1* 11.4* 12.3*  HCT 38.9* 36.0* 38.9*  MCV 84.2 84.1 84.4  PLT 216 221 212   Basic Metabolic Panel: Recent Labs  Lab 03/09/23 2044 03/10/23 1018 03/11/23 0218 03/12/23 0242 03/13/23 0333 03/14/23 0234  NA 141  --  142 143 139 142  K 3.5  --  3.5 3.7 3.7 4.4  CL 111  --  108 104 103 106  CO2 16*  --  25 25 26 28   GLUCOSE 87  --  114* 117* 106* 111*  BUN 18  --  24* 25* 27* 38*  CREATININE 1.85*  --  2.12* 2.09* 1.93* 2.44*  CALCIUM 9.1  --  8.8* 9.3 9.3 9.0  MG  --  2.0 2.0 1.9  --  2.3  PHOS  --  3.0  --   --   --   --    Liver Function Tests: No results for input(s): "AST", "ALT", "ALKPHOS", "BILITOT", "PROT", "ALBUMIN" in the last 168 hours. CBG: Recent Labs  Lab 03/11/23 0647 03/14/23 0639  GLUCAP 111* 113*    Discharge time spent: greater than 30 minutes.  Signed: Coralie Keens, MD [facility] 03/14/2023

## 2023-03-14 NOTE — Progress Notes (Addendum)
Patient Name: Billy [name] Date of Encounter: 03/14/2023 [facility] Cardiologist: Nanetta Batty, MD   Interval Summary  .    No breathing issues overnight, says his abd pain is better.   Vital Signs .    Vitals:   03/14/23 0056 03/14/23 0513 03/14/23 0727 03/14/23 1107  BP: (!) 153/85 (!) 168/82 (!) 141/75 (!) 147/73  Pulse: 60 86 70 68  Resp: 14 20 19 19   Temp: (!) 97.3 F (36.3 C) (!) 97.4 F (36.3 C) 97.9 F (36.6 C) 97.7 F (36.5 C)  TempSrc: Oral Oral Oral Oral  SpO2: 96% 95% 94% 96%  Weight:  86 kg    Height:        Intake/Output Summary (Last 24 hours) at 03/14/2023 1119 Last data filed at 03/14/2023 1108 Gross per 24 hour  Intake 960 ml  Output 1000 ml  Net -40 ml      03/14/2023    5:13 AM 03/13/2023    3:08 AM 03/12/2023    5:00 AM  Last 3 Weights  Weight (lbs) 189 lb 9.5 oz 190 lb 4.1 oz 192 lb 7.4 oz  Weight (kg) 86 kg 86.3 kg 87.3 kg      Telemetry/ECG    Sinus Rhythm, PVCs, PACs - Personally Reviewed  Physical Exam .   GEN: No acute distress.   Neck: No JVD Cardiac: RRR, no murmurs, rubs, or gallops.  Respiratory: Clear to auscultation bilaterally. GI: Soft, nontender, non-distended  MS: No edema  Assessment & Plan .     72 y.o. male with a history of CAD with NSTEMI in 03/2018 (cardiac catheterization was not preformed due to GI bleed and renal function and he was treated medically), paroxysmal atrial fibrillation not on anticoagulation due to prior GI bleed, TIA, carotid artery disease s/p left CEA in 07/2022 and right TCAR in 09/2022, DVT, hypertension, hyperlipidemia, CKD stage IIIb, gastritis, and anemia who was admitted on 03/09/2023 for acute diastolic CHF after presenting with dyspnea and lower extremity edema.   Acute Diastolic CHF -- Patient presented with dyspnea and lower extremity edema in setting of not taking home Lasix due to increased urination. BNP elevated at 491. CXR pulmonary vascular congestion with small  bilateral pleural effusion (L > R).  -- Echo showed LVEF of 60-65% with mild LVH and normal diastolic parameters as well as normal RV function. -- s/p IV lasix, net - 9.2L, weight 198>>189lbs  -- transitioned back to lasix 40mg  daily  -- Continue Spironolactone 12.5mg  daily   Paroxysmal Atrial Fibrillation -- Initially noted on loop recorder after TIA in 11/2017. He was initially started on Eliquis but this was subsequently stopped in setting of a GI bleed in 03/2018 due to gastric ulcer. Last interrogation of loop recorder in 2021 showed very low atrial fibrillation burden.  -- there was question on his telemetry regarding whether there was atrial flutter but EKG confirmed sinus rhythm.  -- Continue Coreg 25mg  twice daily. -- CHA2DS2-VASc = 6 (CHF, HTN, TIA x2, vascular disease, age). Not on anticoagulation given prior GI bleed on Eliquis several years ago.    Elevated Troponin CAD -- Mild coronary calcifications noted on prior CT in 2019. He was admitted with a GI bleed in 03/2018 and ruled in for NSTEMI at that time. However, cardiac catheterization was not performed due to GI bleed and renal function. High-sensitivity troponin this admission mildly elevated and peaked at 117 before trending down. Echo showed LVEF of 60-65% with no regional wall motion  abnormalities. -- No chest pain.  -- Continue Coreg 25mg  twice daily.  -- On DAPT with Aspirin and Plavix for his carotid artery disease.  -- Continue high-intensity statin and Zetia.   Carotid Artery Disease -- History of bilateral carotid stenosis s/p left CEA in 07/2022 and right TCAR in 09/2022.  -- On DAPT with Aspirin and Plavix, continue statin/zetia   Hypertension -- had episode of hypotension yesterday afternoon and Imdur was stopped. BP stabilized  -- Continue Coreg 25mg  twice daily, Hydralazine to 50mg  three times daily (resume tomorrow).   Hyperlipidemia -- Lipid panel this admission: Total Cholesterol 107, Triglycerides 72,  HDL 38, LDL 55.  -- Continue Lipitor 80mg  daily and Zetia 10mg  daily.   CKD Stage IIIb Creatinine 1.85 on admission,  and peaked at 2.12, was trending down. With episode of hypotension yesterday, Cr up 2.44 today  Abdominal Pain Abdominal x-ray on 03/12/2023 showed dilated LUQ small bowel loop. Partial SBO vs focal small bowel ileus could not be excluded. -- Management per primary team  For questions or updates, please contact [facility] Please consult [email or web address] for contact info under        Signed, Laverda Page, NP   Patient seen, examined. Available data reviewed. Agree with findings, assessment, and plan as outlined by Laverda Page, NP.  On my exam today, the patient is sitting up in a chair at the bedside.  HEENT is normal, JVP is normal, lungs are clear bilaterally, heart is regular rate and rhythm no murmur gallop, abdomen is soft nontender, extremities have trace pretibial edema, skin is warm and dry.  The patient is clinically stable.  He will remain on DAPT, high intensity statin drug, and will continue both carvedilol and hydralazine.  His chronic kidney disease stage IIIb is clinically significant.  Creatinine is up to 2.44 today but this is not far off from his baseline.  He will need to remain on oral loop diuretic therapy with furosemide 40 mg daily.  Continue spironolactone and will need repeat metabolic panel in 1 to 2 weeks.  From a cardiac perspective, he is stable for hospital discharge today.  Otherwise as outlined above.  Tonny Bollman, M.D. 03/14/2023 4:49 PM

## 2023-03-14 NOTE — Consult Note (Signed)
[facility] Liaison Consult Note    03/14/2023  Billy [name] 17-Apr-1951 [id number]  Insurance: [employer]   Primary Care Provider: [name], MD With [facility] this provider is listed for the transition of care follow up appointments  and [facility] calls   [facility] Liaison screened the patient remotely at [facility].    The patient was screened for post hospital needs as discussed in unit rounds, medium  risk score for unplanned readmission risk 2 hospital admissions in 6 months.  The patient was assessed for potential Community Care Coordination service needs for post hospital transition for care coordination. Review of patient's electronic medical record reveals patient is for home with [facility] for RN follow up.   Plan: [facility] Liaison will continue to follow progress and disposition to asess for post hospital community care coordination/management needs.  Referral request for community care coordination: Provided updates to [facility] transitional team.   [facility], [facility] does not replace or interfere with any arrangements made by the Inpatient Transition of Care team.   For questions contact:   Charlesetta Shanks, RN, BSN, CCM [place]  [facility], [facility] Liaison Direct Dial: [phone] or secure chat Email: [email or web address]

## 2023-03-14 NOTE — Plan of Care (Signed)
  Problem: Education: Goal: Ability to demonstrate management of disease process will improve Outcome: Adequate for Discharge Goal: Ability to verbalize understanding of medication therapies will improve Outcome: Adequate for Discharge Goal: Individualized Educational Video(s) Outcome: Adequate for Discharge   Problem: Activity: Goal: Capacity to carry out activities will improve Outcome: Adequate for Discharge   Problem: Cardiac: Goal: Ability to achieve and maintain adequate cardiopulmonary perfusion will improve Outcome: Adequate for Discharge   Problem: Education: Goal: Knowledge of General Education information will improve Description: Including pain rating scale, medication(s)/side effects and non-pharmacologic comfort measures Outcome: Adequate for Discharge   Problem: Health Behavior/Discharge Planning: Goal: Ability to manage health-related needs will improve Outcome: Adequate for Discharge   Problem: Clinical Measurements: Goal: Ability to maintain clinical measurements within normal limits will improve Outcome: Adequate for Discharge Goal: Will remain free from infection Outcome: Adequate for Discharge Goal: Diagnostic test results will improve Outcome: Adequate for Discharge Goal: Respiratory complications will improve Outcome: Adequate for Discharge Goal: Cardiovascular complication will be avoided Outcome: Adequate for Discharge   Problem: Activity: Goal: Risk for activity intolerance will decrease Outcome: Adequate for Discharge   Problem: Nutrition: Goal: Adequate nutrition will be maintained Outcome: Adequate for Discharge   Problem: Coping: Goal: Level of anxiety will decrease Outcome: Adequate for Discharge   Problem: Elimination: Goal: Will not experience complications related to bowel motility Outcome: Adequate for Discharge Goal: Will not experience complications related to urinary retention Outcome: Adequate for Discharge   Problem: Pain  Management: Goal: General experience of comfort will improve Outcome: Adequate for Discharge   Problem: Safety: Goal: Ability to remain free from injury will improve Outcome: Adequate for Discharge   Problem: Skin Integrity: Goal: Risk for impaired skin integrity will decrease Outcome: Adequate for Discharge   Problem: Acute Rehab OT Goals (only OT should resolve) Goal: Pt. Will Perform Lower Body Bathing Outcome: Adequate for Discharge Goal: Pt. Will Perform Lower Body Dressing Outcome: Adequate for Discharge Goal: OT Additional ADL Goal #1 Outcome: Adequate for Discharge Goal: OT Additional ADL Goal #2 Outcome: Adequate for Discharge

## 2023-03-14 NOTE — TOC Transition Note (Signed)
Transition of Care [facility]) - CM/SW Discharge Note   Patient Details  Name: Billy [name] MRN: [id number] Date of Birth: August 01, 1950  Transition of Care [facility]) CM/SW Contact:  Leone Haven, RN Phone Number: 03/14/2023, 1:20 PM   Clinical Narrative:    For dc today, NCM offered choice for [facility], he does not have a preference.  NCM made referral to Amy with [facility].  She is able to take referral for CHF disease management and Med management and assist with ted hose education.  Soc will begin 24 to 48 hrs post dc.  He has transportation.   Final next level of care: Home w Home Health Services Barriers to Discharge: No Barriers Identified   Patient Goals and CMS Choice CMS Medicare.gov Compare Post Acute Care list provided to:: Patient Choice offered to / list presented to : Patient  Discharge Placement                         Discharge Plan and Services Additional resources added to the After Visit Summary for   In-house Referral: NA Discharge Planning Services: CM Consult Post Acute Care Choice: NA          DME Arranged: N/A DME Agency: NA       HH Arranged: RN, Disease Management HH Agency: [facility] Date [facility] Agency Contacted: 03/14/23 Time HH Agency Contacted: 1320 Representative spoke with at [facility] Agency: Amy  Social Determinants of Health (SDOH) Interventions SDOH Screenings   Food Insecurity: No Food Insecurity (03/10/2023)  Housing: Low Risk  (03/10/2023)  Transportation Needs: No Transportation Needs (03/10/2023)  Utilities: Not At Risk (03/10/2023)  Depression (PHQ2-9): Low Risk  (12/01/2020)  Tobacco Use: Low Risk  (03/09/2023)     Readmission Risk Interventions    10/14/2022   11:29 AM  Readmission Risk Prevention Plan  Post Dischage Appt Complete  Medication Screening Complete  Transportation Screening Complete

## 2023-03-15 ENCOUNTER — Telehealth: Payer: Self-pay | Admitting: Internal Medicine

## 2023-03-15 NOTE — Telephone Encounter (Signed)
Patient called about inability to sleep, which is his biggest complaint. He is persistently having to stand up to "catch air" and has had continued shallow breathing.  His shallow breathing has improved, and is similar to when he was discharged, however, it has not resolved. He denies chest pain, shortness of breath. He usually sleeps in his recliner, which his how he slept in the hospital, however, he feels like he's not able to sleep in his recliner because he is not able to fully exhale. He lives alone. He has taken all of his medications as prescribed upon discharged. He is urinating adequately. Carmine does not have a scale at home, thus, he can't measure his weight. He does not have a pulse ox at home. Patient does not feel as though he needs to go to the ED, however, he would like to visit his Primary Cardiologist today in order to address his issues. I agreed with this plan. I gave Kedarrius anticipatory guidance on when to return to the ED.   Sondra Barges, MD Cardiology Fellow, PGY-5

## 2023-03-20 DIAGNOSIS — E785 Hyperlipidemia, unspecified: Secondary | ICD-10-CM | POA: Diagnosis not present

## 2023-03-20 DIAGNOSIS — K2970 Gastritis, unspecified, without bleeding: Secondary | ICD-10-CM | POA: Diagnosis not present

## 2023-03-20 DIAGNOSIS — I5033 Acute on chronic diastolic (congestive) heart failure: Secondary | ICD-10-CM | POA: Diagnosis not present

## 2023-03-20 DIAGNOSIS — I2510 Atherosclerotic heart disease of native coronary artery without angina pectoris: Secondary | ICD-10-CM | POA: Diagnosis not present

## 2023-03-20 DIAGNOSIS — I130 Hypertensive heart and chronic kidney disease with heart failure and stage 1 through stage 4 chronic kidney disease, or unspecified chronic kidney disease: Secondary | ICD-10-CM | POA: Diagnosis not present

## 2023-03-20 DIAGNOSIS — N1832 Chronic kidney disease, stage 3b: Secondary | ICD-10-CM | POA: Diagnosis not present

## 2023-03-20 DIAGNOSIS — Z7982 Long term (current) use of aspirin: Secondary | ICD-10-CM | POA: Diagnosis not present

## 2023-03-20 DIAGNOSIS — I480 Paroxysmal atrial fibrillation: Secondary | ICD-10-CM | POA: Diagnosis not present

## 2023-03-20 DIAGNOSIS — Z7902 Long term (current) use of antithrombotics/antiplatelets: Secondary | ICD-10-CM | POA: Diagnosis not present
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
## 2023-03-27 ENCOUNTER — Encounter: Payer: Self-pay | Admitting: General Practice

## 2023-03-27 ENCOUNTER — Ambulatory Visit: Payer: PPO | Attending: General Practice | Admitting: General Practice

## 2023-03-27 VITALS — BP 120/60 | HR 70 | Ht 73.0 in | Wt 179.0 lb

## 2023-03-27 DIAGNOSIS — R600 Localized edema: Secondary | ICD-10-CM | POA: Diagnosis not present

## 2023-03-27 DIAGNOSIS — E782 Mixed hyperlipidemia: Secondary | ICD-10-CM | POA: Diagnosis not present

## 2023-03-27 DIAGNOSIS — I5033 Acute on chronic diastolic (congestive) heart failure: Secondary | ICD-10-CM | POA: Diagnosis not present

## 2023-03-27 DIAGNOSIS — I6521 Occlusion and stenosis of right carotid artery: Secondary | ICD-10-CM

## 2023-03-27 DIAGNOSIS — E7800 Pure hypercholesterolemia, unspecified: Secondary | ICD-10-CM | POA: Diagnosis not present

## 2023-03-27 DIAGNOSIS — I130 Hypertensive heart and chronic kidney disease with heart failure and stage 1 through stage 4 chronic kidney disease, or unspecified chronic kidney disease: Secondary | ICD-10-CM | POA: Diagnosis not present

## 2023-03-27 DIAGNOSIS — I10 Essential (primary) hypertension: Secondary | ICD-10-CM

## 2023-03-27 DIAGNOSIS — N184 Chronic kidney disease, stage 4 (severe): Secondary | ICD-10-CM | POA: Diagnosis not present

## 2023-03-27 DIAGNOSIS — I5022 Chronic systolic (congestive) heart failure: Secondary | ICD-10-CM | POA: Diagnosis not present

## 2023-03-27 DIAGNOSIS — I480 Paroxysmal atrial fibrillation: Secondary | ICD-10-CM

## 2023-03-27 NOTE — Patient Instructions (Signed)
Medication Instructions:  The current medical regimen is effective;  continue present plan and medications as directed. Please refer to the Current Medication list given to you today.  *If you need a refill on your cardiac medications before your next appointment, please call your pharmacy*  Lab Work: BMET TODAY If you have labs (blood work) drawn today and your tests are completely normal, you will receive your results only by:  MyChart Message (if you have MyChart) OR  A paper copy in the mail If you have any lab test that is abnormal or we need to change your treatment, we will call you to review the results.  Other Instructions PLEASE PURCHASE AND USE COMPRESSION STOCKING AID LOG YOUR WEIGHT  Follow-Up: At [facility], you and your health needs are our priority.  As part of our continuing mission to provide you with exceptional heart care, we have created designated Provider Care Teams.  These Care Teams include your primary Cardiologist (physician) and Advanced Practice Providers (APPs -  Physician Assistants and Nurse Practitioners) who all work together to provide you with the care you need, when you need it.  Your next appointment:   3-4 month(s)  Provider:   Nanetta Batty, MD  or Edd Fabian, FNP        CHAIR EXERCISES It's no secret that regular exercise is essential for healthy aging. But physical changes -- like weaker muscles and bones -- can make it harder to exercise as you get older. That doesn't mean that older adults and people with limited mobility have to be sedentary. Chair exercises are a great way for seniors to stay active in their golden years. Seated and standing options also make chair workouts accessible to many people.   What are the best chair exercises for seniors?  A fitness plan for healthy aging should include aerobics, strength training, and balance and flexibility exercises. And a chair-based exercise routine is no exception. You can modify  seated and standing chair exercises based on your fitness level and ability.   Get your primary care provider's OK -- especially if you have chronic conditions -- before you try new chair exercises. And make sure you have a sturdy chair. Then, you can try the following 12 moves for a full-body chair workout. Remember to stay in a pain-free range of motion rather than pushing through discomfort.  Search and compare options Search is powered by a third party. By clicking a topic in the advertisement above, you agree that you will visit a landing page with search results generated by a third party, and that your personal identifiers and engagement on this page and the landing page may be shared with such third party. [employer] may receive compensation in relation to your search.  1. Clenched fists and wrist circles A woman does wrist circle exercises. [facility] Warm up with low-intensity moves like wrist circles to promote circulation and prepare your muscles for exercise.   Step 1: Sit up with your back straight and your feet on the floor.  Step 2: Extend your arms straight out with your palms facing down.  Step 3: Clench your fists, opening and closing them three times.  Step 4: Roll your clenched fists in circles 10 times in each direction.  2. Ankle ABCs A woman does ankle ABCs exercises on a chair. [facility] [facility] This move can boost ankle mobility, which helps keep you steady on your feet as you walk. It doubles as a stretching and strengthening exercise.  Step 1: Sit tall with your feet on the floor and your palms resting on your thighs.  Step 2: Lift one leg straight out in front of you. Step 3: Imagine your big toe is a pen. Use your toe to write the alphabet, making the letters as big as you can. Move your foot rather than your whole leg.  Step 4: Repeat steps 2 to 3 with your other leg.  Read more like this Explore these related articles, suggested for readers like you.  6 Top Yoga  Balance Poses and How to Practice Them  Yoga for Beginners: Here's How to Get Started  6 Best Muscular Endurance Exercises and Their Benefits  10 Desk Exercises and Stretches You Can Easily Do at Work For an added challenge, you can try writing your name in cursive or spelling words backward.  3. Seated marches A woman does a seated march exercise. [facility] Get your heart pumping with seated marches. This exercise can boost flexibility and mobility in your hips and thighs.   Step 1: Sit toward the front of your chair. Keep your back straight, your feet hip-width apart, and your arms at your sides.  Step 2: Squeeze your abdominal muscles to engage your core.  Step 3: Lift your right leg as high as you can while keeping your knee bent.  Step 4: Lower your right foot to the floor slowly.  Step 5: Repeat the move with your left leg to complete one rep. Step 6: Do two to three sets

## 2023-03-28 ENCOUNTER — Other Ambulatory Visit: Payer: Self-pay

## 2023-03-28 DIAGNOSIS — I5033 Acute on chronic diastolic (congestive) heart failure: Secondary | ICD-10-CM

## 2023-03-28 LAB — BASIC METABOLIC PANEL
BUN/Creatinine Ratio: 17 (ref 10–24)
BUN: 46 mg/dL — ABNORMAL HIGH (ref 8–27)
CO2: 21 mmol/L (ref 20–29)
Calcium: 10.6 mg/dL — ABNORMAL HIGH (ref 8.6–10.2)
Chloride: 105 mmol/L (ref 96–106)
Creatinine, Ser: 2.66 mg/dL — ABNORMAL HIGH (ref 0.76–1.27)
Glucose: 96 mg/dL (ref 70–99)
Potassium: 4.7 mmol/L (ref 3.5–5.2)
Sodium: 139 mmol/L (ref 134–144)
eGFR: 25 mL/min/{1.73_m2} — ABNORMAL LOW (ref >59)

## 2023-06-19 DIAGNOSIS — D649 Anemia, unspecified: Secondary | ICD-10-CM | POA: Diagnosis not present

## 2023-06-19 DIAGNOSIS — I252 Old myocardial infarction: Secondary | ICD-10-CM | POA: Diagnosis not present

## 2023-06-19 DIAGNOSIS — N184 Chronic kidney disease, stage 4 (severe): Secondary | ICD-10-CM | POA: Diagnosis not present

## 2023-06-19 DIAGNOSIS — R600 Localized edema: Secondary | ICD-10-CM | POA: Diagnosis not present

## 2023-06-19 DIAGNOSIS — G629 Polyneuropathy, unspecified: Secondary | ICD-10-CM | POA: Diagnosis not present

## 2023-06-19 DIAGNOSIS — I130 Hypertensive heart and chronic kidney disease with heart failure and stage 1 through stage 4 chronic kidney disease, or unspecified chronic kidney disease: Secondary | ICD-10-CM | POA: Diagnosis not present

## 2023-06-19 DIAGNOSIS — E7800 Pure hypercholesterolemia, unspecified: Secondary | ICD-10-CM | POA: Diagnosis not present

## 2023-06-19 DIAGNOSIS — Z8673 Personal history of transient ischemic attack (TIA), and cerebral infarction without residual deficits: Secondary | ICD-10-CM | POA: Diagnosis not present

## 2023-06-19 DIAGNOSIS — I779 Disorder of arteries and arterioles, unspecified: Secondary | ICD-10-CM | POA: Diagnosis not present

## 2023-06-19 DIAGNOSIS — G2581 Restless legs syndrome: Secondary | ICD-10-CM | POA: Diagnosis not present

## 2023-06-19 DIAGNOSIS — I5022 Chronic systolic (congestive) heart failure: Secondary | ICD-10-CM | POA: Diagnosis not present

## 2023-06-19 DIAGNOSIS — I480 Paroxysmal atrial fibrillation: Secondary | ICD-10-CM | POA: Diagnosis not present

## 2023-06-26 ENCOUNTER — Encounter (HOSPITAL_COMMUNITY)
Admission: EM | Disposition: A | Discharge status: Skilled Nursing Facility | Payer: Self-pay | Source: Home / Self Care | Attending: Internal Medicine

## 2023-06-26 ENCOUNTER — Inpatient Hospital Stay (HOSPITAL_COMMUNITY)
Admission: EM | Admit: 2023-06-26 | Discharge: 2023-07-20 | DRG: 871 | Disposition: A | Discharge status: Skilled Nursing Facility | Attending: Internal Medicine | Admitting: Internal Medicine

## 2023-06-26 ENCOUNTER — Inpatient Hospital Stay (HOSPITAL_COMMUNITY)

## 2023-06-26 ENCOUNTER — Emergency Department (HOSPITAL_COMMUNITY)

## 2023-06-26 ENCOUNTER — Other Ambulatory Visit: Payer: Self-pay

## 2023-06-26 DIAGNOSIS — I272 Pulmonary hypertension, unspecified: Secondary | ICD-10-CM | POA: Diagnosis not present

## 2023-06-26 DIAGNOSIS — R579 Shock, unspecified: Secondary | ICD-10-CM | POA: Diagnosis not present

## 2023-06-26 DIAGNOSIS — I82613 Acute embolism and thrombosis of superficial veins of upper extremity, bilateral: Secondary | ICD-10-CM | POA: Diagnosis present

## 2023-06-26 DIAGNOSIS — J918 Pleural effusion in other conditions classified elsewhere: Secondary | ICD-10-CM | POA: Diagnosis not present

## 2023-06-26 DIAGNOSIS — R197 Diarrhea, unspecified: Secondary | ICD-10-CM | POA: Diagnosis not present

## 2023-06-26 DIAGNOSIS — K264 Chronic or unspecified duodenal ulcer with hemorrhage: Secondary | ICD-10-CM | POA: Diagnosis present

## 2023-06-26 DIAGNOSIS — Z885 Allergy status to narcotic agent status: Secondary | ICD-10-CM

## 2023-06-26 DIAGNOSIS — I2781 Cor pulmonale (chronic): Secondary | ICD-10-CM | POA: Diagnosis not present

## 2023-06-26 DIAGNOSIS — Y844 Aspiration of fluid as the cause of abnormal reaction of the patient, or of later complication, without mention of misadventure at the time of the procedure: Secondary | ICD-10-CM | POA: Diagnosis not present

## 2023-06-26 DIAGNOSIS — I10 Essential (primary) hypertension: Secondary | ICD-10-CM | POA: Diagnosis present

## 2023-06-26 DIAGNOSIS — I130 Hypertensive heart and chronic kidney disease with heart failure and stage 1 through stage 4 chronic kidney disease, or unspecified chronic kidney disease: Secondary | ICD-10-CM | POA: Diagnosis present

## 2023-06-26 DIAGNOSIS — R0989 Other specified symptoms and signs involving the circulatory and respiratory systems: Secondary | ICD-10-CM | POA: Diagnosis not present

## 2023-06-26 DIAGNOSIS — Z9842 Cataract extraction status, left eye: Secondary | ICD-10-CM

## 2023-06-26 DIAGNOSIS — M545 Low back pain, unspecified: Secondary | ICD-10-CM | POA: Diagnosis not present

## 2023-06-26 DIAGNOSIS — D6832 Hemorrhagic disorder due to extrinsic circulating anticoagulants: Secondary | ICD-10-CM | POA: Diagnosis not present

## 2023-06-26 DIAGNOSIS — R011 Cardiac murmur, unspecified: Secondary | ICD-10-CM | POA: Diagnosis present

## 2023-06-26 DIAGNOSIS — I6523 Occlusion and stenosis of bilateral carotid arteries: Secondary | ICD-10-CM | POA: Diagnosis present

## 2023-06-26 DIAGNOSIS — J690 Pneumonitis due to inhalation of food and vomit: Secondary | ICD-10-CM | POA: Diagnosis not present

## 2023-06-26 DIAGNOSIS — E870 Hyperosmolality and hypernatremia: Secondary | ICD-10-CM | POA: Diagnosis not present

## 2023-06-26 DIAGNOSIS — I213 ST elevation (STEMI) myocardial infarction of unspecified site: Secondary | ICD-10-CM | POA: Diagnosis not present

## 2023-06-26 DIAGNOSIS — G629 Polyneuropathy, unspecified: Secondary | ICD-10-CM | POA: Diagnosis present

## 2023-06-26 DIAGNOSIS — I214 Non-ST elevation (NSTEMI) myocardial infarction: Secondary | ICD-10-CM | POA: Diagnosis not present

## 2023-06-26 DIAGNOSIS — R932 Abnormal findings on diagnostic imaging of liver and biliary tract: Secondary | ICD-10-CM | POA: Diagnosis not present

## 2023-06-26 DIAGNOSIS — E785 Hyperlipidemia, unspecified: Secondary | ICD-10-CM | POA: Diagnosis present

## 2023-06-26 DIAGNOSIS — I2510 Atherosclerotic heart disease of native coronary artery without angina pectoris: Secondary | ICD-10-CM | POA: Diagnosis present

## 2023-06-26 DIAGNOSIS — R9431 Abnormal electrocardiogram [ECG] [EKG]: Secondary | ICD-10-CM

## 2023-06-26 DIAGNOSIS — I6529 Occlusion and stenosis of unspecified carotid artery: Secondary | ICD-10-CM | POA: Diagnosis not present

## 2023-06-26 DIAGNOSIS — J310 Chronic rhinitis: Secondary | ICD-10-CM | POA: Diagnosis present

## 2023-06-26 DIAGNOSIS — Y828 Other medical devices associated with adverse incidents: Secondary | ICD-10-CM | POA: Diagnosis not present

## 2023-06-26 DIAGNOSIS — I2609 Other pulmonary embolism with acute cor pulmonale: Secondary | ICD-10-CM | POA: Diagnosis present

## 2023-06-26 DIAGNOSIS — R570 Cardiogenic shock: Secondary | ICD-10-CM | POA: Diagnosis not present

## 2023-06-26 DIAGNOSIS — R6521 Severe sepsis with septic shock: Secondary | ICD-10-CM | POA: Diagnosis not present

## 2023-06-26 DIAGNOSIS — K2981 Duodenitis with bleeding: Secondary | ICD-10-CM | POA: Diagnosis not present

## 2023-06-26 DIAGNOSIS — I5033 Acute on chronic diastolic (congestive) heart failure: Secondary | ICD-10-CM | POA: Diagnosis present

## 2023-06-26 DIAGNOSIS — Z781 Physical restraint status: Secondary | ICD-10-CM

## 2023-06-26 DIAGNOSIS — J90 Pleural effusion, not elsewhere classified: Secondary | ICD-10-CM

## 2023-06-26 DIAGNOSIS — T502X5A Adverse effect of carbonic-anhydrase inhibitors, benzothiadiazides and other diuretics, initial encounter: Secondary | ICD-10-CM | POA: Diagnosis not present

## 2023-06-26 DIAGNOSIS — I499 Cardiac arrhythmia, unspecified: Secondary | ICD-10-CM | POA: Diagnosis not present

## 2023-06-26 DIAGNOSIS — K921 Melena: Secondary | ICD-10-CM | POA: Diagnosis present

## 2023-06-26 DIAGNOSIS — Z7902 Long term (current) use of antithrombotics/antiplatelets: Secondary | ICD-10-CM

## 2023-06-26 DIAGNOSIS — R627 Adult failure to thrive: Secondary | ICD-10-CM | POA: Diagnosis present

## 2023-06-26 DIAGNOSIS — E876 Hypokalemia: Secondary | ICD-10-CM | POA: Diagnosis present

## 2023-06-26 DIAGNOSIS — Z83438 Family history of other disorder of lipoprotein metabolism and other lipidemia: Secondary | ICD-10-CM

## 2023-06-26 DIAGNOSIS — Z8673 Personal history of transient ischemic attack (TIA), and cerebral infarction without residual deficits: Secondary | ICD-10-CM

## 2023-06-26 DIAGNOSIS — Y92239 Unspecified place in hospital as the place of occurrence of the external cause: Secondary | ICD-10-CM | POA: Diagnosis not present

## 2023-06-26 DIAGNOSIS — E8809 Other disorders of plasma-protein metabolism, not elsewhere classified: Secondary | ICD-10-CM | POA: Diagnosis present

## 2023-06-26 DIAGNOSIS — I779 Disorder of arteries and arterioles, unspecified: Secondary | ICD-10-CM | POA: Diagnosis not present

## 2023-06-26 DIAGNOSIS — N184 Chronic kidney disease, stage 4 (severe): Secondary | ICD-10-CM | POA: Diagnosis not present

## 2023-06-26 DIAGNOSIS — I452 Bifascicular block: Secondary | ICD-10-CM | POA: Diagnosis present

## 2023-06-26 DIAGNOSIS — R231 Pallor: Secondary | ICD-10-CM | POA: Diagnosis not present

## 2023-06-26 DIAGNOSIS — I82531 Chronic embolism and thrombosis of right popliteal vein: Secondary | ICD-10-CM | POA: Diagnosis present

## 2023-06-26 DIAGNOSIS — I462 Cardiac arrest due to underlying cardiac condition: Secondary | ICD-10-CM | POA: Diagnosis present

## 2023-06-26 DIAGNOSIS — I480 Paroxysmal atrial fibrillation: Secondary | ICD-10-CM | POA: Diagnosis present

## 2023-06-26 DIAGNOSIS — R680 Hypothermia, not associated with low environmental temperature: Secondary | ICD-10-CM | POA: Diagnosis present

## 2023-06-26 DIAGNOSIS — Z95820 Peripheral vascular angioplasty status with implants and grafts: Secondary | ICD-10-CM

## 2023-06-26 DIAGNOSIS — F419 Anxiety disorder, unspecified: Secondary | ICD-10-CM | POA: Diagnosis present

## 2023-06-26 DIAGNOSIS — I471 Supraventricular tachycardia, unspecified: Secondary | ICD-10-CM | POA: Diagnosis not present

## 2023-06-26 DIAGNOSIS — G2581 Restless legs syndrome: Secondary | ICD-10-CM | POA: Diagnosis not present

## 2023-06-26 DIAGNOSIS — A419 Sepsis, unspecified organism: Principal | ICD-10-CM | POA: Diagnosis present

## 2023-06-26 DIAGNOSIS — I3139 Other pericardial effusion (noninflammatory): Secondary | ICD-10-CM | POA: Diagnosis present

## 2023-06-26 DIAGNOSIS — S51812A Laceration without foreign body of left forearm, initial encounter: Secondary | ICD-10-CM | POA: Diagnosis present

## 2023-06-26 DIAGNOSIS — I4819 Other persistent atrial fibrillation: Secondary | ICD-10-CM | POA: Diagnosis present

## 2023-06-26 DIAGNOSIS — Z66 Do not resuscitate: Secondary | ICD-10-CM | POA: Diagnosis not present

## 2023-06-26 DIAGNOSIS — I82409 Acute embolism and thrombosis of unspecified deep veins of unspecified lower extremity: Secondary | ICD-10-CM

## 2023-06-26 DIAGNOSIS — N179 Acute kidney failure, unspecified: Secondary | ICD-10-CM | POA: Diagnosis not present

## 2023-06-26 DIAGNOSIS — J9601 Acute respiratory failure with hypoxia: Secondary | ICD-10-CM | POA: Diagnosis present

## 2023-06-26 DIAGNOSIS — D509 Iron deficiency anemia, unspecified: Secondary | ICD-10-CM | POA: Diagnosis present

## 2023-06-26 DIAGNOSIS — I82412 Acute embolism and thrombosis of left femoral vein: Secondary | ICD-10-CM | POA: Diagnosis not present

## 2023-06-26 DIAGNOSIS — I5082 Biventricular heart failure: Secondary | ICD-10-CM | POA: Diagnosis present

## 2023-06-26 DIAGNOSIS — I701 Atherosclerosis of renal artery: Secondary | ICD-10-CM | POA: Diagnosis not present

## 2023-06-26 DIAGNOSIS — Z1152 Encounter for screening for COVID-19: Secondary | ICD-10-CM | POA: Diagnosis not present

## 2023-06-26 DIAGNOSIS — Z7401 Bed confinement status: Secondary | ICD-10-CM | POA: Diagnosis not present

## 2023-06-26 DIAGNOSIS — Z888 Allergy status to other drugs, medicaments and biological substances status: Secondary | ICD-10-CM

## 2023-06-26 DIAGNOSIS — Z604 Social exclusion and rejection: Secondary | ICD-10-CM | POA: Diagnosis present

## 2023-06-26 DIAGNOSIS — R7401 Elevation of levels of liver transaminase levels: Secondary | ICD-10-CM | POA: Diagnosis not present

## 2023-06-26 DIAGNOSIS — I340 Nonrheumatic mitral (valve) insufficiency: Secondary | ICD-10-CM | POA: Diagnosis not present

## 2023-06-26 DIAGNOSIS — R4182 Altered mental status, unspecified: Secondary | ICD-10-CM | POA: Diagnosis not present

## 2023-06-26 DIAGNOSIS — I429 Cardiomyopathy, unspecified: Secondary | ICD-10-CM | POA: Diagnosis present

## 2023-06-26 DIAGNOSIS — I824Y3 Acute embolism and thrombosis of unspecified deep veins of proximal lower extremity, bilateral: Secondary | ICD-10-CM | POA: Diagnosis not present

## 2023-06-26 DIAGNOSIS — I509 Heart failure, unspecified: Secondary | ICD-10-CM | POA: Diagnosis not present

## 2023-06-26 DIAGNOSIS — I8290 Acute embolism and thrombosis of unspecified vein: Secondary | ICD-10-CM | POA: Diagnosis not present

## 2023-06-26 DIAGNOSIS — I517 Cardiomegaly: Secondary | ICD-10-CM | POA: Diagnosis not present

## 2023-06-26 DIAGNOSIS — S3639XA Other injury of stomach, initial encounter: Secondary | ICD-10-CM | POA: Diagnosis not present

## 2023-06-26 DIAGNOSIS — I5023 Acute on chronic systolic (congestive) heart failure: Secondary | ICD-10-CM | POA: Diagnosis not present

## 2023-06-26 DIAGNOSIS — E43 Unspecified severe protein-calorie malnutrition: Secondary | ICD-10-CM | POA: Diagnosis present

## 2023-06-26 DIAGNOSIS — Z961 Presence of intraocular lens: Secondary | ICD-10-CM | POA: Diagnosis present

## 2023-06-26 DIAGNOSIS — R262 Difficulty in walking, not elsewhere classified: Secondary | ICD-10-CM | POA: Diagnosis not present

## 2023-06-26 DIAGNOSIS — Z79899 Other long term (current) drug therapy: Secondary | ICD-10-CM

## 2023-06-26 DIAGNOSIS — K922 Gastrointestinal hemorrhage, unspecified: Secondary | ICD-10-CM | POA: Diagnosis not present

## 2023-06-26 DIAGNOSIS — E1169 Type 2 diabetes mellitus with other specified complication: Secondary | ICD-10-CM | POA: Diagnosis not present

## 2023-06-26 DIAGNOSIS — T68XXXA Hypothermia, initial encounter: Secondary | ICD-10-CM | POA: Diagnosis not present

## 2023-06-26 DIAGNOSIS — Z452 Encounter for adjustment and management of vascular access device: Secondary | ICD-10-CM | POA: Diagnosis not present

## 2023-06-26 DIAGNOSIS — I469 Cardiac arrest, cause unspecified: Secondary | ICD-10-CM | POA: Diagnosis not present

## 2023-06-26 DIAGNOSIS — R609 Edema, unspecified: Secondary | ICD-10-CM | POA: Diagnosis not present

## 2023-06-26 DIAGNOSIS — I4891 Unspecified atrial fibrillation: Secondary | ICD-10-CM | POA: Diagnosis not present

## 2023-06-26 DIAGNOSIS — Z6824 Body mass index (BMI) 24.0-24.9, adult: Secondary | ICD-10-CM

## 2023-06-26 DIAGNOSIS — K625 Hemorrhage of anus and rectum: Secondary | ICD-10-CM | POA: Diagnosis not present

## 2023-06-26 DIAGNOSIS — G931 Anoxic brain damage, not elsewhere classified: Secondary | ICD-10-CM | POA: Diagnosis present

## 2023-06-26 DIAGNOSIS — D500 Iron deficiency anemia secondary to blood loss (chronic): Secondary | ICD-10-CM | POA: Diagnosis not present

## 2023-06-26 DIAGNOSIS — I82432 Acute embolism and thrombosis of left popliteal vein: Secondary | ICD-10-CM | POA: Diagnosis present

## 2023-06-26 DIAGNOSIS — N1832 Chronic kidney disease, stage 3b: Secondary | ICD-10-CM | POA: Diagnosis present

## 2023-06-26 DIAGNOSIS — Z9889 Other specified postprocedural states: Secondary | ICD-10-CM

## 2023-06-26 DIAGNOSIS — R0600 Dyspnea, unspecified: Secondary | ICD-10-CM | POA: Diagnosis not present

## 2023-06-26 DIAGNOSIS — W19XXXA Unspecified fall, initial encounter: Secondary | ICD-10-CM | POA: Diagnosis present

## 2023-06-26 DIAGNOSIS — D62 Acute posthemorrhagic anemia: Secondary | ICD-10-CM | POA: Diagnosis not present

## 2023-06-26 DIAGNOSIS — R846 Abnormal cytological findings in specimens from respiratory organs and thorax: Secondary | ICD-10-CM | POA: Diagnosis not present

## 2023-06-26 DIAGNOSIS — R58 Hemorrhage, not elsewhere classified: Secondary | ICD-10-CM | POA: Diagnosis not present

## 2023-06-26 DIAGNOSIS — M6281 Muscle weakness (generalized): Secondary | ICD-10-CM | POA: Diagnosis not present

## 2023-06-26 DIAGNOSIS — J984 Other disorders of lung: Secondary | ICD-10-CM | POA: Diagnosis not present

## 2023-06-26 DIAGNOSIS — Z86711 Personal history of pulmonary embolism: Secondary | ICD-10-CM

## 2023-06-26 DIAGNOSIS — N400 Enlarged prostate without lower urinary tract symptoms: Secondary | ICD-10-CM | POA: Diagnosis present

## 2023-06-26 DIAGNOSIS — R0602 Shortness of breath: Secondary | ICD-10-CM | POA: Diagnosis not present

## 2023-06-26 DIAGNOSIS — I82621 Acute embolism and thrombosis of deep veins of right upper extremity: Secondary | ICD-10-CM | POA: Diagnosis present

## 2023-06-26 DIAGNOSIS — I5030 Unspecified diastolic (congestive) heart failure: Secondary | ICD-10-CM | POA: Diagnosis not present

## 2023-06-26 DIAGNOSIS — I472 Ventricular tachycardia, unspecified: Secondary | ICD-10-CM | POA: Diagnosis not present

## 2023-06-26 DIAGNOSIS — Z7984 Long term (current) use of oral hypoglycemic drugs: Secondary | ICD-10-CM

## 2023-06-26 DIAGNOSIS — I82402 Acute embolism and thrombosis of unspecified deep veins of left lower extremity: Secondary | ICD-10-CM | POA: Diagnosis not present

## 2023-06-26 DIAGNOSIS — E872 Acidosis, unspecified: Secondary | ICD-10-CM | POA: Diagnosis present

## 2023-06-26 DIAGNOSIS — K219 Gastro-esophageal reflux disease without esophagitis: Secondary | ICD-10-CM | POA: Diagnosis not present

## 2023-06-26 DIAGNOSIS — I4901 Ventricular fibrillation: Secondary | ICD-10-CM | POA: Diagnosis not present

## 2023-06-26 DIAGNOSIS — J181 Lobar pneumonia, unspecified organism: Secondary | ICD-10-CM

## 2023-06-26 DIAGNOSIS — Z812 Family history of tobacco abuse and dependence: Secondary | ICD-10-CM

## 2023-06-26 DIAGNOSIS — I700 Atherosclerosis of aorta: Secondary | ICD-10-CM | POA: Diagnosis not present

## 2023-06-26 DIAGNOSIS — Z4682 Encounter for fitting and adjustment of non-vascular catheter: Secondary | ICD-10-CM | POA: Diagnosis not present

## 2023-06-26 DIAGNOSIS — Z7982 Long term (current) use of aspirin: Secondary | ICD-10-CM

## 2023-06-26 DIAGNOSIS — S50812A Abrasion of left forearm, initial encounter: Secondary | ICD-10-CM | POA: Diagnosis present

## 2023-06-26 DIAGNOSIS — K3189 Other diseases of stomach and duodenum: Secondary | ICD-10-CM | POA: Diagnosis present

## 2023-06-26 DIAGNOSIS — I252 Old myocardial infarction: Secondary | ICD-10-CM

## 2023-06-26 DIAGNOSIS — R188 Other ascites: Secondary | ICD-10-CM | POA: Diagnosis not present

## 2023-06-26 DIAGNOSIS — R0689 Other abnormalities of breathing: Secondary | ICD-10-CM | POA: Diagnosis not present

## 2023-06-26 DIAGNOSIS — J811 Chronic pulmonary edema: Secondary | ICD-10-CM | POA: Diagnosis not present

## 2023-06-26 DIAGNOSIS — R34 Anuria and oliguria: Secondary | ICD-10-CM | POA: Diagnosis present

## 2023-06-26 DIAGNOSIS — J810 Acute pulmonary edema: Secondary | ICD-10-CM | POA: Diagnosis not present

## 2023-06-26 DIAGNOSIS — I5043 Acute on chronic combined systolic (congestive) and diastolic (congestive) heart failure: Secondary | ICD-10-CM | POA: Diagnosis not present

## 2023-06-26 DIAGNOSIS — F32A Depression, unspecified: Secondary | ICD-10-CM | POA: Diagnosis not present

## 2023-06-26 DIAGNOSIS — Z515 Encounter for palliative care: Secondary | ICD-10-CM | POA: Diagnosis not present

## 2023-06-26 DIAGNOSIS — T45515A Adverse effect of anticoagulants, initial encounter: Secondary | ICD-10-CM | POA: Diagnosis not present

## 2023-06-26 DIAGNOSIS — Z8249 Family history of ischemic heart disease and other diseases of the circulatory system: Secondary | ICD-10-CM

## 2023-06-26 DIAGNOSIS — R918 Other nonspecific abnormal finding of lung field: Secondary | ICD-10-CM | POA: Diagnosis not present

## 2023-06-26 DIAGNOSIS — F4310 Post-traumatic stress disorder, unspecified: Secondary | ICD-10-CM | POA: Diagnosis present

## 2023-06-26 DIAGNOSIS — R54 Age-related physical debility: Secondary | ICD-10-CM | POA: Diagnosis present

## 2023-06-26 DIAGNOSIS — Z833 Family history of diabetes mellitus: Secondary | ICD-10-CM

## 2023-06-26 DIAGNOSIS — E7800 Pure hypercholesterolemia, unspecified: Secondary | ICD-10-CM | POA: Diagnosis present

## 2023-06-26 DIAGNOSIS — Z7901 Long term (current) use of anticoagulants: Secondary | ICD-10-CM

## 2023-06-26 DIAGNOSIS — R531 Weakness: Secondary | ICD-10-CM | POA: Diagnosis not present

## 2023-06-26 DIAGNOSIS — N189 Chronic kidney disease, unspecified: Secondary | ICD-10-CM | POA: Diagnosis not present

## 2023-06-26 DIAGNOSIS — E782 Mixed hyperlipidemia: Secondary | ICD-10-CM | POA: Diagnosis not present

## 2023-06-26 DIAGNOSIS — Z8781 Personal history of (healed) traumatic fracture: Secondary | ICD-10-CM

## 2023-06-26 DIAGNOSIS — M7989 Other specified soft tissue disorders: Secondary | ICD-10-CM | POA: Diagnosis not present

## 2023-06-26 DIAGNOSIS — Z7189 Other specified counseling: Secondary | ICD-10-CM | POA: Diagnosis not present

## 2023-06-26 DIAGNOSIS — I493 Ventricular premature depolarization: Secondary | ICD-10-CM | POA: Diagnosis present

## 2023-06-26 LAB — CBC WITH DIFFERENTIAL/PLATELET
Abs Immature Granulocytes: 1.75 10*3/uL — ABNORMAL HIGH (ref 0.00–0.07)
Basophils Absolute: 0.1 10*3/uL (ref 0.0–0.1)
Basophils Relative: 1 %
Eosinophils Absolute: 0.4 10*3/uL (ref 0.0–0.5)
Eosinophils Relative: 2 %
HCT: 38.9 % — ABNORMAL LOW (ref 39.0–52.0)
Hemoglobin: 11.2 g/dL — ABNORMAL LOW (ref 13.0–17.0)
Immature Granulocytes: 10 %
Lymphocytes Relative: 23 %
Lymphs Abs: 4.1 10*3/uL — ABNORMAL HIGH (ref 0.7–4.0)
MCH: 25.3 pg — ABNORMAL LOW (ref 26.0–34.0)
MCHC: 28.8 g/dL — ABNORMAL LOW (ref 30.0–36.0)
MCV: 87.8 fL (ref 80.0–100.0)
Monocytes Absolute: 0.7 10*3/uL (ref 0.1–1.0)
Monocytes Relative: 4 %
Neutro Abs: 11 10*3/uL — ABNORMAL HIGH (ref 1.7–7.7)
Neutrophils Relative %: 60 %
Platelets: 341 10*3/uL (ref 150–400)
RBC: 4.43 MIL/uL (ref 4.22–5.81)
RDW: 16.6 % — ABNORMAL HIGH (ref 11.5–15.5)
Smear Review: NORMAL
WBC: 18.1 10*3/uL — ABNORMAL HIGH (ref 4.0–10.5)
nRBC: 0.4 % — ABNORMAL HIGH (ref 0.0–0.2)

## 2023-06-26 LAB — I-STAT ARTERIAL BLOOD GAS, ED
Acid-base deficit: 21 mmol/L — ABNORMAL HIGH (ref 0.0–2.0)
Bicarbonate: 10.9 mmol/L — ABNORMAL LOW (ref 20.0–28.0)
Calcium, Ion: 1.21 mmol/L (ref 1.15–1.40)
HCT: 31 % — ABNORMAL LOW (ref 39.0–52.0)
Hemoglobin: 10.5 g/dL — ABNORMAL LOW (ref 13.0–17.0)
O2 Saturation: 77 %
Patient temperature: 95.8
Potassium: 2.9 mmol/L — ABNORMAL LOW (ref 3.5–5.1)
Sodium: 145 mmol/L (ref 135–145)
TCO2: 13 mmol/L — ABNORMAL LOW (ref 22–32)
pCO2 arterial: 49.9 mmHg — ABNORMAL HIGH (ref 32–48)
pH, Arterial: 6.936 — CL (ref 7.35–7.45)
pO2, Arterial: 61 mmHg — ABNORMAL LOW (ref 83–108)

## 2023-06-26 LAB — BASIC METABOLIC PANEL
Anion gap: 15 (ref 5–15)
BUN: 46 mg/dL — ABNORMAL HIGH (ref 8–23)
CO2: 12 mmol/L — ABNORMAL LOW (ref 22–32)
Calcium: 8.2 mg/dL — ABNORMAL LOW (ref 8.9–10.3)
Chloride: 117 mmol/L — ABNORMAL HIGH (ref 98–111)
Creatinine, Ser: 2.1 mg/dL — ABNORMAL HIGH (ref 0.61–1.24)
GFR, Estimated: 33 mL/min — ABNORMAL LOW (ref >60)
Glucose, Bld: 223 mg/dL — ABNORMAL HIGH (ref 70–99)
Potassium: 2.9 mmol/L — ABNORMAL LOW (ref 3.5–5.1)
Sodium: 144 mmol/L (ref 135–145)

## 2023-06-26 LAB — I-STAT VENOUS BLOOD GAS, ED
Acid-base deficit: 20 mmol/L — ABNORMAL HIGH (ref 0.0–2.0)
Bicarbonate: 10 mmol/L — ABNORMAL LOW (ref 20.0–28.0)
Calcium, Ion: 1.09 mmol/L — ABNORMAL LOW (ref 1.15–1.40)
HCT: 36 % — ABNORMAL LOW (ref 39.0–52.0)
Hemoglobin: 12.2 g/dL — ABNORMAL LOW (ref 13.0–17.0)
O2 Saturation: 66 %
Potassium: 3 mmol/L — ABNORMAL LOW (ref 3.5–5.1)
Sodium: 142 mmol/L (ref 135–145)
TCO2: 11 mmol/L — ABNORMAL LOW (ref 22–32)
pCO2, Ven: 39.6 mmHg — ABNORMAL LOW (ref 44–60)
pH, Ven: 7.009 — CL (ref 7.25–7.43)
pO2, Ven: 51 mmHg — ABNORMAL HIGH (ref 32–45)

## 2023-06-26 LAB — I-STAT CHEM 8, ED
BUN: 53 mg/dL — ABNORMAL HIGH (ref 8–23)
Calcium, Ion: 1.09 mmol/L — ABNORMAL LOW (ref 1.15–1.40)
Chloride: 118 mmol/L — ABNORMAL HIGH (ref 98–111)
Creatinine, Ser: 2.2 mg/dL — ABNORMAL HIGH (ref 0.61–1.24)
Glucose, Bld: 207 mg/dL — ABNORMAL HIGH (ref 70–99)
HCT: 36 % — ABNORMAL LOW (ref 39.0–52.0)
Hemoglobin: 12.2 g/dL — ABNORMAL LOW (ref 13.0–17.0)
Potassium: 3 mmol/L — ABNORMAL LOW (ref 3.5–5.1)
Sodium: 141 mmol/L (ref 135–145)
TCO2: 12 mmol/L — ABNORMAL LOW (ref 22–32)

## 2023-06-26 LAB — MAGNESIUM: Magnesium: 2.5 mg/dL — ABNORMAL HIGH (ref 1.7–2.4)

## 2023-06-26 LAB — I-STAT CG4 LACTIC ACID, ED: Lactic Acid, Venous: 6.5 mmol/L (ref 0.5–1.9)

## 2023-06-26 LAB — TROPONIN I (HIGH SENSITIVITY)
Troponin I (High Sensitivity): 112 ng/L (ref <18)
Troponin I (High Sensitivity): 243 ng/L (ref <18)

## 2023-06-26 LAB — GLUCOSE, CAPILLARY: Glucose-Capillary: 149 mg/dL — ABNORMAL HIGH (ref 70–99)

## 2023-06-26 LAB — LACTIC ACID, PLASMA: Lactic Acid, Venous: 2.7 mmol/L (ref 0.5–1.9)

## 2023-06-26 SURGERY — CORONARY/GRAFT ACUTE MI REVASCULARIZATION
Anesthesia: LOCAL

## 2023-06-26 MED ORDER — POTASSIUM CHLORIDE 20 MEQ PO PACK: 40.0000 meq | PACK | ORAL | Status: DC

## 2023-06-26 MED ORDER — MIDAZOLAM HCL 2 MG/2ML IJ SOLN
1.0000 mg | Freq: Once | INTRAMUSCULAR | Status: AC
  Administered 2023-06-26: 1 mg via INTRAVENOUS
  Filled 2023-06-26: qty 2

## 2023-06-26 MED ORDER — ETOMIDATE 2 MG/ML IV SOLN: 20.0000 mg | Freq: Once | INTRAVENOUS | Status: DC

## 2023-06-26 MED ORDER — SODIUM BICARBONATE 8.4 % IV SOLN
100.0000 meq | Freq: Once | INTRAVENOUS | Status: AC
  Administered 2023-06-26: 100 meq via INTRAVENOUS

## 2023-06-26 MED ORDER — ROCURONIUM BROMIDE 10 MG/ML (PF) SYRINGE
PREFILLED_SYRINGE | INTRAVENOUS | Status: AC | PRN
  Administered 2023-06-26: 80 mg via INTRAVENOUS

## 2023-06-26 MED ORDER — FENTANYL 2500MCG IN NS 250ML (10MCG/ML) PREMIX INFUSION
50.0000 ug/h | INTRAVENOUS | Status: DC
  Administered 2023-06-26: 50 ug/h via INTRAVENOUS
  Administered 2023-06-27: 100 ug/h via INTRAVENOUS
  Administered 2023-06-28: 125 ug/h via INTRAVENOUS
  Filled 2023-06-26 (×3): qty 250

## 2023-06-26 MED ORDER — CHLORHEXIDINE GLUCONATE CLOTH 2 % EX PADS
6.0000 | MEDICATED_PAD | Freq: Every day | CUTANEOUS | Status: DC
  Administered 2023-06-27 – 2023-07-20 (×23): 6 via TOPICAL

## 2023-06-26 MED ORDER — POLYETHYLENE GLYCOL 3350 17 G PO PACK: 17.0000 g | PACK | Freq: Every day | ORAL | Status: DC | PRN

## 2023-06-26 MED ORDER — POTASSIUM CHLORIDE 20 MEQ PO PACK
40.0000 meq | PACK | Freq: Once | ORAL | Status: AC
  Administered 2023-06-27: 40 meq
  Filled 2023-06-26: qty 2

## 2023-06-26 MED ORDER — EZETIMIBE 10 MG PO TABS
10.0000 mg | ORAL_TABLET | Freq: Every day | ORAL | Status: DC
  Administered 2023-06-26 – 2023-06-29 (×4): 10 mg
  Filled 2023-06-26 (×4): qty 1

## 2023-06-26 MED ORDER — SODIUM CHLORIDE 0.9 % IV BOLUS
500.0000 mL | Freq: Once | INTRAVENOUS | Status: AC
  Administered 2023-06-26: 500 mL via INTRAVENOUS

## 2023-06-26 MED ORDER — CLOPIDOGREL BISULFATE 75 MG PO TABS
75.0000 mg | ORAL_TABLET | Freq: Every day | ORAL | Status: DC
  Administered 2023-06-26 – 2023-06-29 (×4): 75 mg
  Filled 2023-06-26 (×4): qty 1

## 2023-06-26 MED ORDER — ROCURONIUM BROMIDE 10 MG/ML (PF) SYRINGE: 80.0000 mg | PREFILLED_SYRINGE | Freq: Once | INTRAVENOUS | Status: DC

## 2023-06-26 MED ORDER — CALCIUM GLUCONATE-NACL 1-0.675 GM/50ML-% IV SOLN
1.0000 g | Freq: Once | INTRAVENOUS | Status: AC
  Administered 2023-06-26: 1000 mg via INTRAVENOUS
  Filled 2023-06-26: qty 50

## 2023-06-26 MED ORDER — FENTANYL BOLUS VIA INFUSION
50.0000 ug | INTRAVENOUS | Status: DC | PRN
  Administered 2023-06-26 – 2023-06-27 (×4): 100 ug via INTRAVENOUS
  Administered 2023-06-27: 50 ug via INTRAVENOUS
  Administered 2023-06-27 – 2023-06-28 (×4): 100 ug via INTRAVENOUS

## 2023-06-26 MED ORDER — STERILE WATER FOR INJECTION IV SOLN
INTRAVENOUS | Status: DC
  Filled 2023-06-26 (×3): qty 1000

## 2023-06-26 MED ORDER — PANTOPRAZOLE SODIUM 40 MG IV SOLR
40.0000 mg | Freq: Every day | INTRAVENOUS | Status: DC
  Administered 2023-06-26 – 2023-06-29 (×4): 40 mg via INTRAVENOUS
  Filled 2023-06-26 (×4): qty 10

## 2023-06-26 MED ORDER — ASPIRIN 300 MG RE SUPP
300.0000 mg | Freq: Once | RECTAL | Status: AC
  Administered 2023-06-26: 300 mg via RECTAL

## 2023-06-26 MED ORDER — ASPIRIN 81 MG PO CHEW
81.0000 mg | CHEWABLE_TABLET | Freq: Every day | ORAL | Status: DC
  Administered 2023-06-27 – 2023-06-29 (×3): 81 mg
  Filled 2023-06-26 (×3): qty 1

## 2023-06-26 MED ORDER — SODIUM CHLORIDE 0.9% FLUSH
10.0000 mL | Freq: Two times a day (BID) | INTRAVENOUS | Status: DC
  Administered 2023-06-27 – 2023-07-03 (×13): 10 mL
  Administered 2023-07-03: 40 mL
  Administered 2023-07-04 – 2023-07-05 (×3): 10 mL
  Administered 2023-07-05: 20 mL
  Administered 2023-07-06 – 2023-07-07 (×3): 10 mL
  Administered 2023-07-07: 40 mL
  Administered 2023-07-08 (×2): 10 mL
  Administered 2023-07-10: 40 mL
  Administered 2023-07-10 – 2023-07-12 (×3): 10 mL
  Administered 2023-07-12: 20 mL
  Administered 2023-07-13: 10 mL
  Administered 2023-07-13: 20 mL
  Administered 2023-07-14 – 2023-07-16 (×4): 10 mL
  Administered 2023-07-17: 20 mL
  Administered 2023-07-17 – 2023-07-19 (×4): 10 mL
  Administered 2023-07-19: 40 mL
  Administered 2023-07-20: 20 mL

## 2023-06-26 MED ORDER — ETOMIDATE 2 MG/ML IV SOLN
INTRAVENOUS | Status: AC | PRN
  Administered 2023-06-26: 20 mg via INTRAVENOUS

## 2023-06-26 MED ORDER — POTASSIUM CHLORIDE 20 MEQ PO PACK
80.0000 meq | PACK | Freq: Once | ORAL | Status: AC
  Administered 2023-06-26: 80 meq
  Filled 2023-06-26: qty 4

## 2023-06-26 MED ORDER — HEPARIN SODIUM (PORCINE) 5000 UNIT/ML IJ SOLN
4000.0000 [IU] | Freq: Once | INTRAMUSCULAR | Status: AC
  Administered 2023-06-26: 4000 [IU] via INTRAVENOUS

## 2023-06-26 MED ORDER — HEPARIN SODIUM (PORCINE) 5000 UNIT/ML IJ SOLN
5000.0000 [IU] | Freq: Three times a day (TID) | INTRAMUSCULAR | Status: DC
  Administered 2023-06-26 – 2023-06-27 (×3): 5000 [IU] via SUBCUTANEOUS
  Filled 2023-06-26 (×4): qty 1

## 2023-06-26 MED ORDER — DOCUSATE SODIUM 50 MG/5ML PO LIQD: 100.0000 mg | Freq: Two times a day (BID) | ORAL | Status: DC | PRN

## 2023-06-26 MED ORDER — LACTATED RINGERS IV SOLN: INTRAVENOUS | Status: DC

## 2023-06-26 MED ORDER — SODIUM CHLORIDE 0.9% FLUSH: 10.0000 mL | INTRAVENOUS | Status: DC | PRN

## 2023-06-26 MED ORDER — INSULIN ASPART 100 UNIT/ML IJ SOLN
0.0000 [IU] | INTRAMUSCULAR | Status: DC
  Administered 2023-06-26 – 2023-06-27 (×3): 1 [IU] via SUBCUTANEOUS
  Administered 2023-06-27: 2 [IU] via SUBCUTANEOUS
  Administered 2023-06-27 – 2023-07-08 (×23): 1 [IU] via SUBCUTANEOUS
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
## 2023-06-26 NOTE — ED Notes (Signed)
 Pt bp increased to 225/117 spoke to MD orders placed and bolus of fentynal given

## 2023-06-26 NOTE — Progress Notes (Signed)
 eLink Physician-Brief Progress Note Patient Name: Billy [name] DOB: 10-23-1950 MRN: [id number]   Date of Service  06/26/2023  HPI/Events of Note  73 year old male with a history of CKD, heart failure, and atrial fibrillation who had a cardiac arrest in the field and achieved ROSC after 3 minutes.  Intubated in the emergency department.  Had an additional PEA arrest in the ER.  Laboratory studies consistent with severe metabolic acidosis.  Hypokalemia to 2.9.  Elevated creatinine of 2.1.  Lactic acid 2.7.  Leukocytosis present.  ET tube in appropriate position.  Left-sided subclavian CVC in place.  ECG with right bundle branch and inferoseptal T wave inversions.  eICU Interventions  Maintain bicarb infusion.  Aggressive electrolyte repletion -KCl ordered.  Line/tubes confirmed.  Neuroprotective measures-avoid fevers.  Maintain minimal sedation as tolerated.  GI prophylaxis with pantoprazole DVT prophylaxis with heparin subcutaneous.   0036 -unable to Doppler Right DP but able to Doppler PT. cap refill is equal bilaterally, temperature to touch is similar bilaterally.  Some degree of mottling in lower extremities bilaterally..  Troponin uptrending.  Blood pressure downtrending, initiate norepinephrine infusion.  0115 -patient found to be severely hypotensive and unresponsive.  MAP of 30.  Titrating norepinephrine per protocol, I requested that norepinephrine be escalated to 30 mcg rapidly.  Femoral pulses present throughout.  Will increase the bicarb infusion rate.  1 ampoule of bicarb administered.  Will obtain an arterial line.  Add vasopressin.  1914 -had another bout of severe hypotension requiring rapid escalation of epinephrine, bicarbonate push.  On review, it appears that the arterial line is failing.  Intervention Category Evaluation Type: New Patient Evaluation  [name] 06/26/2023, 11:16 PM

## 2023-06-26 NOTE — Procedures (Signed)
 Central Venous Catheter Insertion Procedure Note  Billy [name]  [id number]  December 13, 1950  Date:06/26/23  Time:10:23 PM   Provider Performing:Jourdon Zimmerle Celine Mans   Procedure: Insertion of Non-tunneled Central Venous Catheter(36556) without US guidance  Indication(s) Medication administration and Difficult access  Consent Risks of the procedure as well as the alternatives and risks of each were explained to the patient and/or caregiver.  Consent for the procedure was obtained and is signed in the bedside chart  Anesthesia Topical only with 1% lidocaine   Timeout Verified patient identification, verified procedure, site/side was marked, verified correct patient position, special equipment/implants available, medications/allergies/relevant history reviewed, required imaging and test results available.  Sterile Technique Maximal sterile technique including full sterile barrier drape, hand hygiene, sterile gown, sterile gloves, mask, hair covering, sterile ultrasound probe cover (if used).  Procedure Description Area of catheter insertion was cleaned with chlorhexidine and draped in sterile fashion.  Without real-time ultrasound guidance a central venous catheter was placed into the left subclavian vein. Nonpulsatile blood flow and easy flushing noted in all ports.  The catheter was sutured in place and sterile dressing applied.  Complications/Tolerance None; patient tolerated the procedure well. Chest X-ray is ordered to verify placement for internal jugular or subclavian cannulation.   Chest x-ray is not ordered for femoral cannulation.  EBL Minimal  Specimen(s) None   Rutherford Guys, PA - [facility] [facility] For pager details, please see AMION or use Epic chat  After 1900, please call [facility] for cross coverage needs 06/26/2023, 10:24 PM

## 2023-06-26 NOTE — Progress Notes (Signed)
 Pt transported from ED Trauma B to 2H08 without complications.
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
## 2023-06-26 NOTE — ED Provider Notes (Incomplete)
 [place] EMERGENCY DEPARTMENT AT [facility] Provider Note   CSN: [id number] Arrival date & time: 06/26/23  1959     History {Add pertinent medical, surgical, social history, OB history to HPI:1} No chief complaint on file.   Billy [name] is a 73 y.o. male.  HPI     Home Medications Prior to Admission medications   Medication Sig Start Date End Date Taking? Authorizing Provider  aspirin EC 81 MG tablet Take 1 tablet (81 mg total) by mouth daily. Swallow whole. 07/29/22   Lars Mage, PA-C  atorvastatin (LIPITOR) 80 MG tablet Take 1 tablet (80 mg total) by mouth daily with supper. 10/14/22   Schuh, McKenzi P, PA-C  carvedilol (COREG) 25 MG tablet Take 25 mg by mouth 2 (two) times daily.     [provider]  clopidogrel (PLAVIX) 75 MG tablet TAKE 1 TABLET BY MOUTH EVERY DAY 01/03/23   Victorino Sparrow, MD  ezetimibe (ZETIA) 10 MG tablet Take 1 tablet (10 mg total) by mouth daily. Patient taking differently: Take 10 mg by mouth daily with supper. 09/01/22   Victorino Sparrow, MD  fluticasone (FLONASE) 50 MCG/ACT nasal spray Place 1 spray into both nostrils 2 (two) times daily. 03/06/23   [provider]  furosemide (LASIX) 40 MG tablet Take 1 tablet (40 mg total) by mouth daily. 03/15/23   Arrien, York Ram, MD  hydrALAZINE (APRESOLINE) 50 MG tablet Take 1 tablet (50 mg total) by mouth every 8 (eight) hours. 03/15/23 04/14/23  Arrien, York Ram, MD  pantoprazole (PROTONIX) 40 MG tablet Take 1 tablet (40 mg total) by mouth daily. 03/15/23 04/14/23  Arrien, York Ram, MD  spironolactone (ALDACTONE) 25 MG tablet Take 0.5 tablets (12.5 mg total) by mouth daily. 03/15/23 04/14/23  Arrien, York Ram, MD      Allergies    Amlodipine and Morphine and codeine    Review of Systems   Review of Systems  Physical Exam Updated Vital Signs BP (!) 169/82   Pulse 81   Temp (!) 95.8 F (35.4 C)   Resp 10   SpO2 (!) 84%  Physical  Exam  ED Results / Procedures / Treatments   Labs (all labs ordered are listed, but only abnormal results are displayed) Labs Reviewed  I-STAT VENOUS BLOOD GAS, ED - Abnormal; Notable for the following components:      Result Value   pH, Ven 7.009 (*)    pCO2, Ven 39.6 (*)    pO2, Ven 51 (*)    Bicarbonate 10.0 (*)    TCO2 11 (*)    Acid-base deficit 20.0 (*)    Potassium 3.0 (*)    Calcium, Ion 1.09 (*)    HCT 36.0 (*)    Hemoglobin 12.2 (*)    All other components within normal limits  I-STAT CG4 LACTIC ACID, ED - Abnormal; Notable for the following components:   Lactic Acid, Venous 6.5 (*)    All other components within normal limits  I-STAT CHEM 8, ED - Abnormal; Notable for the following components:   Potassium 3.0 (*)    Chloride 118 (*)    BUN 53 (*)    Creatinine, Ser 2.20 (*)    Glucose, Bld 207 (*)    Calcium, Ion 1.09 (*)    TCO2 12 (*)    Hemoglobin 12.2 (*)    HCT 36.0 (*)    All other components within normal limits  I-STAT ARTERIAL BLOOD GAS, ED - Abnormal; Notable  for the following components:   pH, Arterial 6.936 (*)    pCO2 arterial 49.9 (*)    pO2, Arterial 61 (*)    Bicarbonate 10.9 (*)    TCO2 13 (*)    Acid-base deficit 21.0 (*)    Potassium 2.9 (*)    HCT 31.0 (*)    Hemoglobin 10.5 (*)    All other components within normal limits  BASIC METABOLIC PANEL  CBC WITH DIFFERENTIAL/PLATELET  MAGNESIUM  I-STAT VENOUS BLOOD GAS, ED  I-STAT CHEM 8, ED  TROPONIN I (HIGH SENSITIVITY)    EKG None  Radiology No results found.  Procedures Procedures  {Document cardiac monitor, telemetry assessment procedure when appropriate:1}  Medications Ordered in ED Medications  etomidate (AMIDATE) injection 20 mg (has no administration in time range)  rocuronium (ZEMURON) injection 80 mg (has no administration in time range)  etomidate (AMIDATE) injection (20 mg Intravenous Given 06/26/23 2013)  rocuronium (ZEMURON) injection (80 mg Intravenous Given  06/26/23 2014)  sodium bicarbonate injection 100 mEq (has no administration in time range)  fentaNYL in NS (68mcg/ml) infusion-PREMIX (has no administration in time range)  fentaNYL (SUBLIMAZE) bolus via infusion 50-100 mcg (has no administration in time range)  sodium chloride 0.9 % bolus 500 mL (500 mLs Intravenous New Bag/Given 06/26/23 2000)  heparin injection 4,000 Units (4,000 Units Intravenous Given 06/26/23 2001)  aspirin suppository 300 mg (300 mg Rectal Given 06/26/23 2032)    ED Course/ Medical Decision Making/ A&P   {   Click here for ABCD2, HEART and other calculatorsREFRESH Note before signing :1}                              Medical Decision Making Amount and/or Complexity of Data Reviewed Labs: ordered. Radiology: ordered.  Risk OTC drugs. Prescription drug management.   ***  {Document critical care time when appropriate:1} {Document review of labs and clinical decision tools ie heart score, Chads2Vasc2 etc:1}  {Document your independent review of radiology images, and any outside records:1} {Document your discussion with family members, caretakers, and with consultants:1} {Document social determinants of health affecting pt's care:1} {Document your decision making why or why not admission, treatments were needed:1} Final Clinical Impression(s) / ED Diagnoses Final diagnoses:  ST elevation myocardial infarction (STEMI), unspecified artery (HCC)  Cardiac arrest (HCC)    Rx / DC Orders ED Discharge Orders     None

## 2023-06-26 NOTE — Code Documentation (Signed)
 Family updated as to patient's status.

## 2023-06-26 NOTE — Progress Notes (Addendum)
 Interventional Cardiology Note  Called to ER due to possible STEMI.  Patient is 27M with AF here after witnessed cardiac arrest >> bystander + EMS CPR x 20 min>>EKG with slurred QRS in inferior leads.  Repeat EKG here SR + RBBB/IVCD and ST changes inferior leads.  VBG with pH 7.0, lactic acid 6.  Experienced PEA arrest here as well >> ROSC CPR x 3 min.  Given down time, severe acidosis (ABG pH 6.9), and lack of definitive STEMI criteria will defer emergency cardiac catheterization at this time >> discussed with ED staff.

## 2023-06-26 NOTE — ED Provider Notes (Signed)
 [place] EMERGENCY DEPARTMENT AT [facility] Provider Note   CSN: [id number] Arrival date & time: 06/26/23  1959     History  Chief Complaint  Patient presents with   Cardiac Arrest   Billy [name] is a 73 y.o. male.  Patient arrives as a postcardiac arrest.  Patient was witnessed complaining of chest pain and then had a witnessed arrest.  CPR was started by bystanders.  EMS arrived and continued CPR.  Initial arrival of EMS was at 1916.  Patient had refractory V-fib.  Received 300 mg of amiodarone and 3 x 1 mg of epinephrine..  CPR was stopped at 1932.  Patient received 50 mg of fentanyl prior to arrival.  On arrival, i-gel was in place.  In chart review, patient had a prior admission to this hospital in November 2020 for management of heart failure with a left ventricular ejection fraction of 60 to 65%.  Patient also has a history of heart failure, CKD, CAD, HLD, paroxysmal A-fib.     Home Medications Prior to Admission medications   Medication Sig Start Date End Date Taking? Authorizing Provider  aspirin EC 81 MG tablet Take 1 tablet (81 mg total) by mouth daily. Swallow whole. 07/29/22   Lars Mage, PA-C  atorvastatin (LIPITOR) 80 MG tablet Take 1 tablet (80 mg total) by mouth daily with supper. 10/14/22   Schuh, McKenzi P, PA-C  carvedilol (COREG) 25 MG tablet Take 25 mg by mouth 2 (two) times daily.     [provider]  clopidogrel (PLAVIX) 75 MG tablet TAKE 1 TABLET BY MOUTH EVERY DAY 01/03/23   Victorino Sparrow, MD  ezetimibe (ZETIA) 10 MG tablet Take 1 tablet (10 mg total) by mouth daily. Patient taking differently: Take 10 mg by mouth daily with supper. 09/01/22   Victorino Sparrow, MD  fluticasone (FLONASE) 50 MCG/ACT nasal spray Place 1 spray into both nostrils 2 (two) times daily. 03/06/23   [provider]  furosemide (LASIX) 40 MG tablet Take 1 tablet (40 mg total) by mouth daily. 03/15/23   Arrien, York Ram, MD  hydrALAZINE  (APRESOLINE) 50 MG tablet Take 1 tablet (50 mg total) by mouth every 8 (eight) hours. 03/15/23 04/14/23  Arrien, York Ram, MD  pantoprazole (PROTONIX) 40 MG tablet Take 1 tablet (40 mg total) by mouth daily. 03/15/23 04/14/23  Arrien, York Ram, MD  spironolactone (ALDACTONE) 25 MG tablet Take 0.5 tablets (12.5 mg total) by mouth daily. 03/15/23 04/14/23  Arrien, York Ram, MD      Allergies    Amlodipine and Morphine and codeine    Review of Systems   Review of Systems  Physical Exam Updated Vital Signs BP 126/73   Pulse 90   Temp (!) 94.6 F (34.8 C)   Resp (!) 24   Wt 80.3 kg   SpO2 100%   BMI 23.36 kg/m  Physical Exam Vitals and nursing note reviewed.  Constitutional:      Interventions: He is intubated and restrained.  HENT:     Head: Normocephalic and atraumatic.  Eyes:     Conjunctiva/sclera: Conjunctivae normal.     Pupils: Pupils are equal, round, and reactive to light.  Cardiovascular:     Rate and Rhythm: Normal rate.  Pulmonary:     Effort: He is intubated.     Breath sounds: Examination of the right-upper field reveals rhonchi. Examination of the left-upper field reveals rhonchi. Examination of the right-middle field reveals rhonchi. Examination of the left-middle  field reveals rhonchi. Examination of the right-lower field reveals rhonchi. Examination of the left-lower field reveals rhonchi. Rhonchi present.  Abdominal:     General: Abdomen is flat.     Palpations: Abdomen is soft.  Musculoskeletal:     Right lower leg: No edema.     Left lower leg: No edema.     Comments: No obvious deformities of extremities besides skin tear noted to left forearm  Skin:    General: Skin is cool and dry.     Comments: Superficial hemostatic abrasion to left forearm     ED Results / Procedures / Treatments   Labs (all labs ordered are listed, but only abnormal results are displayed) Labs Reviewed  BASIC METABOLIC PANEL - Abnormal; Notable for the  following components:      Result Value   Potassium 2.9 (*)    Chloride 117 (*)    CO2 12 (*)    Glucose, Bld 223 (*)    BUN 46 (*)    Creatinine, Ser 2.10 (*)    Calcium 8.2 (*)    GFR, Estimated 33 (*)    All other components within normal limits  CBC WITH DIFFERENTIAL/PLATELET - Abnormal; Notable for the following components:   WBC 18.1 (*)    Hemoglobin 11.2 (*)    HCT 38.9 (*)    MCH 25.3 (*)    MCHC 28.8 (*)    RDW 16.6 (*)    nRBC 0.4 (*)    Neutro Abs 11.0 (*)    Lymphs Abs 4.1 (*)    Abs Immature Granulocytes 1.75 (*)    All other components within normal limits  MAGNESIUM - Abnormal; Notable for the following components:   Magnesium 2.5 (*)    All other components within normal limits  LACTIC ACID, PLASMA - Abnormal; Notable for the following components:   Lactic Acid, Venous 2.7 (*)    All other components within normal limits  GLUCOSE, CAPILLARY - Abnormal; Notable for the following components:   Glucose-Capillary 149 (*)    All other components within normal limits  I-STAT VENOUS BLOOD GAS, ED - Abnormal; Notable for the following components:   pH, Ven 7.009 (*)    pCO2, Ven 39.6 (*)    pO2, Ven 51 (*)    Bicarbonate 10.0 (*)    TCO2 11 (*)    Acid-base deficit 20.0 (*)    Potassium 3.0 (*)    Calcium, Ion 1.09 (*)    HCT 36.0 (*)    Hemoglobin 12.2 (*)    All other components within normal limits  I-STAT CG4 LACTIC ACID, ED - Abnormal; Notable for the following components:   Lactic Acid, Venous 6.5 (*)    All other components within normal limits  I-STAT CHEM 8, ED - Abnormal; Notable for the following components:   Potassium 3.0 (*)    Chloride 118 (*)    BUN 53 (*)    Creatinine, Ser 2.20 (*)    Glucose, Bld 207 (*)    Calcium, Ion 1.09 (*)    TCO2 12 (*)    Hemoglobin 12.2 (*)    HCT 36.0 (*)    All other components within normal limits  I-STAT ARTERIAL BLOOD GAS, ED - Abnormal; Notable for the following components:   pH, Arterial 6.936 (*)     pCO2 arterial 49.9 (*)    pO2, Arterial 61 (*)    Bicarbonate 10.9 (*)    TCO2 13 (*)    Acid-base deficit  21.0 (*)    Potassium 2.9 (*)    HCT 31.0 (*)    Hemoglobin 10.5 (*)    All other components within normal limits  TROPONIN I (HIGH SENSITIVITY) - Abnormal; Notable for the following components:   Troponin I (High Sensitivity) 112 (*)    All other components within normal limits  TROPONIN I (HIGH SENSITIVITY) - Abnormal; Notable for the following components:   Troponin I (High Sensitivity) 243 (*)    All other components within normal limits  TROPONIN I (HIGH SENSITIVITY) - Abnormal; Notable for the following components:   Troponin I (High Sensitivity) 458 (*)    All other components within normal limits  MRSA NEXT GEN BY PCR, NASAL  CBC  BASIC METABOLIC PANEL  BLOOD GAS, ARTERIAL  MAGNESIUM  PHOSPHORUS  CK  LACTIC ACID, PLASMA  BLOOD GAS, ARTERIAL  I-STAT VENOUS BLOOD GAS, ED  I-STAT CHEM 8, ED  TROPONIN I (HIGH SENSITIVITY)    EKG None  Radiology DG Chest Portable 1 View Result Date: 06/26/2023 CLINICAL DATA:  Central venous catheter placement EXAM: PORTABLE CHEST 1 VIEW COMPARISON:  8:26 p.m. FINDINGS: Left subclavian central venous catheter has been placed with its tip overlying the expected superior vena cava. Endotracheal tube is unchanged tip 4.5 cm above the carina. Nasogastric tube extends into the upper abdomen beyond the margin of the examination. Pulmonary insufflation is stable the lungs are well expanded. Small bilateral pleural effusions are suspected on this supine examination accounting for opacity at the lung apices bilaterally. No definite pneumothorax on this supine examination. Extensive diffuse airspace infiltrate with no avulsed caring of the costophrenic angles is again seen most in keeping with noncardiogenic pulmonary edema though extensive pneumonic infiltrate, diffuse pulmonary hemorrhage, or conditions such as alveolar proteinosis could appear  similarly. Cardiac size within normal limits. Implanted loop recorder again noted. IMPRESSION: 1. Interval placement of left subclavian central venous catheter with its tip overlying the expected superior vena cava. No definite pneumothorax on this supine examination. 2. Stable extensive diffuse airspace infiltrate most in keeping with noncardiogenic pulmonary edema though extensive pneumonic infiltrate, diffuse pulmonary hemorrhage, or conditions such as alveolar proteinosis could appear similarly. Electronically Signed   By: Helyn Numbers M.D.   On: 06/26/2023 23:31   DG Abdomen 1 View Result Date: 06/26/2023 CLINICAL DATA:  Orogastric tube placement EXAM: ABDOMEN - 1 VIEW COMPARISON:  None Available. FINDINGS: Orogastric tube tip overlies the mid body of the stomach. Nonobstructive bowel gas pattern. No free intraperitoneal gas. Bilateral pleural effusions and extensive diffuse airspace infiltrate again noted within the visualized lungs. IMPRESSION: 1. Orogastric tube tip within the mid body of the stomach. Electronically Signed   By: Helyn Numbers M.D.   On: 06/26/2023 23:27   DG Chest Portable 1 View Result Date: 06/26/2023 CLINICAL DATA:  Status post cpr.  Intubation EXAM: PORTABLE CHEST 1 VIEW COMPARISON:  CT angio neck 02/16/2022, chest x-ray 03/09/2023, chest x-ray 04/11/2018 FINDINGS: Endotracheal tube with tip terminating 4.5 cm above the carina. Cardiac paddles overlie the chest. The heart and mediastinal contours are unchanged. Atherosclerotic plaque. Diffuse bat wing perihilar interstitial and airspace opacities. at least trace to small left pleural effusion. Right costophrenic angle collimated off view. Question trace apical right hydropneumothorax with associated calcification along the expected pleural line. No left pneumothorax. No acute osseous abnormality. IMPRESSION: 1. Question trace apical right hydropneumothorax. Recommend attention on follow-up. 2. Findings suggestive of ARDS.  Superimposed infection not excluded. 3. At least trace to small left  pleural effusion. Electronically Signed   By: Tish Frederickson M.D.   On: 06/26/2023 22:57    Procedures Procedure Name: Intubation Date/Time: 06/26/2023 8:30 PM  Performed by: Renella Cunas, MDPre-anesthesia Checklist: Patient identified, Emergency Drugs available, Suction available, Timeout performed and Patient being monitored Preoxygenation: Pre-oxygenation with 100% oxygen (igel in place) Induction Type: Rapid sequence and IV induction Laryngoscope Size: Mac and 4 Grade View: Grade I Tube size: 7.5 mm Number of attempts: 1 Airway Equipment and Method: Rigid stylet Placement Confirmation: ETT inserted through vocal cords under direct vision, Positive ETCO2 and Breath sounds checked- equal and bilateral Secured at: 26 cm Tube secured with: ETT holder      Medications Ordered in ED Medications  etomidate (AMIDATE) injection 20 mg (20 mg Intravenous Not Given 06/26/23 2013)  rocuronium (ZEMURON) injection 80 mg (80 mg Intravenous Not Given 06/26/23 2014)  fentaNYL in NS (73mcg/ml) infusion-PREMIX (125 mcg/hr Intravenous Infusion Verify 06/27/23 0000)  fentaNYL (SUBLIMAZE) bolus via infusion 50-100 mcg (100 mcg Intravenous Bolus from Bag 06/26/23 2131)  heparin injection 5,000 Units (5,000 Units Subcutaneous Given 06/26/23 2338)  pantoprazole (PROTONIX) injection 40 mg (40 mg Intravenous Given 06/26/23 2316)  docusate (COLACE) 50 MG/5ML liquid 100 mg (has no administration in time range)  polyethylene glycol (MIRALAX / GLYCOLAX) packet 17 g (has no administration in time range)  aspirin chewable tablet 81 mg (has no administration in time range)  clopidogrel (PLAVIX) tablet 75 mg (75 mg Per Tube Given 06/26/23 2316)  ezetimibe (ZETIA) tablet 10 mg (10 mg Per Tube Given 06/26/23 2316)  insulin aspart (novoLOG) injection 0-9 Units ( Subcutaneous Not Given 06/26/23 2318)  sodium bicarbonate 150 mEq in sterile  water 1,150 mL infusion ( Intravenous Infusion Verify 06/27/23 0000)  sodium chloride flush (NS) 0.9 % injection 10-40 mL (10 mLs Intracatheter Given 06/27/23 0009)  sodium chloride flush (NS) 0.9 % injection 10-40 mL (has no administration in time range)  Chlorhexidine Gluconate Cloth 2 % PADS 6 each (has no administration in time range)  norepinephrine (LEVOPHED) 4mg  in (0.016 mg/mL) premix infusion (2 mcg/min Intravenous New Bag/Given 06/27/23 0051)  norepinephrine (LEVOPHED) 4-5 MG/250ML-% infusion SOLN (has no administration in time range)  sodium chloride 0.9 % bolus 500 mL (500 mLs Intravenous New Bag/Given 06/26/23 2000)  heparin injection 4,000 Units (4,000 Units Intravenous Given 06/26/23 2001)  etomidate (AMIDATE) injection (20 mg Intravenous Given 06/26/23 2013)  rocuronium (ZEMURON) injection (80 mg Intravenous Given 06/26/23 2014)  aspirin suppository 300 mg (300 mg Rectal Given 06/26/23 2032)  sodium bicarbonate injection 100 mEq (100 mEq Intravenous Given 06/26/23 2044)  potassium chloride (KLOR-CON) packet 80 mEq (80 mEq Per Tube Given 06/26/23 2316)  calcium gluconate 1 g/ 50 mL sodium chloride IVPB (0 mg Intravenous Stopped 06/26/23 2245)  midazolam (VERSED) injection 1 mg (1 mg Intravenous Given 06/26/23 2136)  potassium chloride (KLOR-CON) packet 40 mEq (40 mEq Per Tube Given 06/27/23 0051)    ED Course/ Medical Decision Making/ A&P                                 Medical Decision Making Amount and/or Complexity of Data Reviewed Labs: ordered. Radiology: ordered.  Risk OTC drugs. Prescription drug management.   On arrival, patient has an Igel in place.  Unable to obtain oxygen saturations on multiple placements of pulse oximeter.  Blood pressure manual on arrival is 120/110.  Patient has a pulse of  50-60.  Initial temperature is hypothermic at 95 F.  Patient placed on Bair hugger.  Temperature reading Foley catheter placed for strict I's and O's and temperature  evaluation.  EKG that had been transmitted by EMS showed inferior STEMI with ST elevations in leads II, 3, aVF.  On arrival, patient was given IV fluid as patient is preload dependent.  Initial EKG obtained on arrival, with improvement in ST elevation in lower suspicion for inferior STEMI.  Cardiology had been consulted prior to patient arrival as a code STEMI.  Patient was initiated on heparin with an aspirin suppository for STEMI.  Optimized patient prior to intubation with appropriate preload resuscitation with IV fluids, and obtained pulse oximetry showing patient satting at approximately 80%-90%.  Patient had appropriate blood pressure prior to intubation.  Opted for rapid sequence in patient with etomidate and rocuronium.  Intubation as above.  Following intubation, patient was started on a fentanyl drip.  Following intubation, patient bradycardia down and was found to be pulseless in PEA arrest.  After 2 rounds of CPR and 1 mg of epinephrine, ROSC was obtained.  POCUS performed at bedside notable for appropriate ejection fraction with no focal wall abnormalities, specifically in the inferior wall from sternal notch view.  ABG notable for pH of 6.936 with a bicarb of 10.9.  Administered 100 mg equivalents of sodium bicarb.  Post ROSC EKG obtained which continues to show less ST elevation in the inferior leads, per cardiology was at bedside.  They have low suspicion that patient would benefit from cardiac catheterization at this time.  Please see their consult note for further details.    Patient will be admitted to pulmonary critical care for post cardiac arrest medical management.  I discussed with patient's son at bedside who arrived following critical care treatment.  He admits that patient has a living will that his stepsister is currently reading through.  He advises that while patient is full code for now, I have initiated conversation between himself and stepsister for next steps.   Final  Clinical Impression(s) / ED Diagnoses Final diagnoses:  Cardiac arrest (HCC)  Hypothermia, initial encounter  Abnormal EKG    Rx / DC Orders ED Discharge Orders     None      Renella Cunas, PGY-2 Emergency Medicine   Renella Cunas, MD 06/27/23 1610    Blane Ohara, MD 06/28/23 1547

## 2023-06-26 NOTE — ED Provider Notes (Signed)
 ATTENDING SUPERVISORY NOTE I have personally viewed the imaging studies performed, and I was present for [place] and critical portions of the procedure as documented. I have personally seen and examined the patient, and discussed the plan of care with the resident.  I have reviewed the documentation of the resident and agree.   ST elevation myocardial infarction (STEMI), unspecified artery (HCC)  Cardiac arrest (HCC)  .Critical Care  Performed by: Blane Ohara, MD Authorized by: Blane Ohara, MD   Critical care provider statement:    Critical care time (minutes):  80   Critical care start time:  06/26/2023 7:51 PM   Critical care end time:  06/26/2023 9:11 PM   Critical care time was exclusive of:  Separately billable procedures and treating other patients and teaching time   Critical care was necessary to treat or prevent imminent or life-threatening deterioration of the following conditions:  Cardiac failure   Critical care was time spent personally by me on the following activities:  Discussions with consultants, ordering and review of laboratory studies, ordering and review of radiographic studies, pulse oximetry and review of old charts     Blane Ohara, MD 06/28/23 1547

## 2023-06-27 ENCOUNTER — Inpatient Hospital Stay (HOSPITAL_COMMUNITY)

## 2023-06-27 ENCOUNTER — Encounter (HOSPITAL_COMMUNITY): Payer: Self-pay | Admitting: Pulmonary Disease

## 2023-06-27 ENCOUNTER — Other Ambulatory Visit (HOSPITAL_COMMUNITY)

## 2023-06-27 DIAGNOSIS — J9601 Acute respiratory failure with hypoxia: Secondary | ICD-10-CM

## 2023-06-27 DIAGNOSIS — R609 Edema, unspecified: Secondary | ICD-10-CM

## 2023-06-27 DIAGNOSIS — R579 Shock, unspecified: Secondary | ICD-10-CM | POA: Diagnosis not present

## 2023-06-27 DIAGNOSIS — I214 Non-ST elevation (NSTEMI) myocardial infarction: Secondary | ICD-10-CM

## 2023-06-27 DIAGNOSIS — I509 Heart failure, unspecified: Secondary | ICD-10-CM | POA: Diagnosis not present

## 2023-06-27 DIAGNOSIS — N179 Acute kidney failure, unspecified: Secondary | ICD-10-CM

## 2023-06-27 DIAGNOSIS — I469 Cardiac arrest, cause unspecified: Secondary | ICD-10-CM | POA: Diagnosis not present

## 2023-06-27 DIAGNOSIS — J810 Acute pulmonary edema: Secondary | ICD-10-CM

## 2023-06-27 LAB — BASIC METABOLIC PANEL
Anion gap: 14 (ref 5–15)
BUN: 56 mg/dL — ABNORMAL HIGH (ref 8–23)
CO2: 20 mmol/L — ABNORMAL LOW (ref 22–32)
Calcium: 7.7 mg/dL — ABNORMAL LOW (ref 8.9–10.3)
Chloride: 110 mmol/L (ref 98–111)
Creatinine, Ser: 2.8 mg/dL — ABNORMAL HIGH (ref 0.61–1.24)
GFR, Estimated: 24 mL/min — ABNORMAL LOW (ref >60)
Glucose, Bld: 150 mg/dL — ABNORMAL HIGH (ref 70–99)
Potassium: 3.5 mmol/L (ref 3.5–5.1)
Sodium: 144 mmol/L (ref 135–145)

## 2023-06-27 LAB — COMPREHENSIVE METABOLIC PANEL
ALT: 57 U/L — ABNORMAL HIGH (ref 0–44)
AST: 78 U/L — ABNORMAL HIGH (ref 15–41)
Albumin: 1.7 g/dL — ABNORMAL LOW (ref 3.5–5.0)
Alkaline Phosphatase: 63 U/L (ref 38–126)
Anion gap: 13 (ref 5–15)
BUN: 51 mg/dL — ABNORMAL HIGH (ref 8–23)
CO2: 21 mmol/L — ABNORMAL LOW (ref 22–32)
Calcium: 7.5 mg/dL — ABNORMAL LOW (ref 8.9–10.3)
Chloride: 113 mmol/L — ABNORMAL HIGH (ref 98–111)
Creatinine, Ser: 2.41 mg/dL — ABNORMAL HIGH (ref 0.61–1.24)
GFR, Estimated: 28 mL/min — ABNORMAL LOW (ref >60)
Glucose, Bld: 213 mg/dL — ABNORMAL HIGH (ref 70–99)
Potassium: 4.4 mmol/L (ref 3.5–5.1)
Sodium: 147 mmol/L — ABNORMAL HIGH (ref 135–145)
Total Bilirubin: 0.7 mg/dL (ref 0.0–1.2)
Total Protein: 4.5 g/dL — ABNORMAL LOW (ref 6.5–8.1)

## 2023-06-27 LAB — RESPIRATORY PANEL BY PCR

## 2023-06-27 LAB — CBC
HCT: 33.3 % — ABNORMAL LOW (ref 39.0–52.0)
Hemoglobin: 10.5 g/dL — ABNORMAL LOW (ref 13.0–17.0)
MCH: 25.1 pg — ABNORMAL LOW (ref 26.0–34.0)
MCHC: 31.5 g/dL (ref 30.0–36.0)
MCV: 79.5 fL — ABNORMAL LOW (ref 80.0–100.0)
Platelets: 383 10*3/uL (ref 150–400)
RBC: 4.19 MIL/uL — ABNORMAL LOW (ref 4.22–5.81)
RDW: 16.5 % — ABNORMAL HIGH (ref 11.5–15.5)
WBC: 17.2 10*3/uL — ABNORMAL HIGH (ref 4.0–10.5)
nRBC: 0.3 % — ABNORMAL HIGH (ref 0.0–0.2)

## 2023-06-27 LAB — COOXEMETRY PANEL
Carboxyhemoglobin: 1.3 % (ref 0.5–1.5)
Carboxyhemoglobin: 1.1 % (ref 0.5–1.5)
Methemoglobin: <0.7 % (ref 0.0–1.5)
Methemoglobin: <0.7 % (ref 0.0–1.5)
O2 Saturation: 56.2 %
O2 Saturation: 58.7 %
Total hemoglobin: 10.4 g/dL — ABNORMAL LOW (ref 12.0–16.0)
Total hemoglobin: 10.6 g/dL — ABNORMAL LOW (ref 12.0–16.0)

## 2023-06-27 LAB — GLUCOSE, CAPILLARY
Glucose-Capillary: 110 mg/dL — ABNORMAL HIGH (ref 70–99)
Glucose-Capillary: 126 mg/dL — ABNORMAL HIGH (ref 70–99)
Glucose-Capillary: 142 mg/dL — ABNORMAL HIGH (ref 70–99)
Glucose-Capillary: 148 mg/dL — ABNORMAL HIGH (ref 70–99)
Glucose-Capillary: 162 mg/dL — ABNORMAL HIGH (ref 70–99)
Glucose-Capillary: 168 mg/dL — ABNORMAL HIGH (ref 70–99)
Glucose-Capillary: 95 mg/dL (ref 70–99)

## 2023-06-27 LAB — RESP PANEL BY RT-PCR (RSV, FLU A&B, COVID)  RVPGX2
Influenza A by PCR: NEGATIVE
Influenza B by PCR: NEGATIVE
Resp Syncytial Virus by PCR: NEGATIVE
SARS Coronavirus 2 by RT PCR: NEGATIVE

## 2023-06-27 LAB — TROPONIN I (HIGH SENSITIVITY)
Troponin I (High Sensitivity): 458 ng/L (ref <18)
Troponin I (High Sensitivity): 3045 ng/L (ref <18)
Troponin I (High Sensitivity): 2839 ng/L (ref <18)

## 2023-06-27 LAB — PHOSPHORUS: Phosphorus: 7.1 mg/dL — ABNORMAL HIGH (ref 2.5–4.6)

## 2023-06-27 LAB — POCT I-STAT 7, (LYTES, BLD GAS, ICA,H+H)
Acid-base deficit: 9 mmol/L — ABNORMAL HIGH (ref 0.0–2.0)
Acid-base deficit: 4 mmol/L — ABNORMAL HIGH (ref 0.0–2.0)
Bicarbonate: 17.3 mmol/L — ABNORMAL LOW (ref 20.0–28.0)
Bicarbonate: 20.9 mmol/L (ref 20.0–28.0)
Calcium, Ion: 1.15 mmol/L (ref 1.15–1.40)
Calcium, Ion: 1.1 mmol/L — ABNORMAL LOW (ref 1.15–1.40)
HCT: 33 % — ABNORMAL LOW (ref 39.0–52.0)
HCT: 32 % — ABNORMAL LOW (ref 39.0–52.0)
Hemoglobin: 11.2 g/dL — ABNORMAL LOW (ref 13.0–17.0)
Hemoglobin: 10.9 g/dL — ABNORMAL LOW (ref 13.0–17.0)
O2 Saturation: 98 %
O2 Saturation: 97 %
Patient temperature: 96.2
Patient temperature: 97.8
Potassium: 4.9 mmol/L (ref 3.5–5.1)
Potassium: 3.7 mmol/L (ref 3.5–5.1)
Sodium: 141 mmol/L (ref 135–145)
Sodium: 144 mmol/L (ref 135–145)
TCO2: 18 mmol/L — ABNORMAL LOW (ref 22–32)
TCO2: 22 mmol/L (ref 22–32)
pCO2 arterial: 34.8 mmHg (ref 32–48)
pCO2 arterial: 36.9 mmHg (ref 32–48)
pH, Arterial: 7.298 — ABNORMAL LOW (ref 7.35–7.45)
pH, Arterial: 7.358 (ref 7.35–7.45)
pO2, Arterial: 100 mmHg (ref 83–108)
pO2, Arterial: 96 mmHg (ref 83–108)

## 2023-06-27 LAB — LACTIC ACID, PLASMA
Lactic Acid, Venous: 3.4 mmol/L (ref 0.5–1.9)
Lactic Acid, Venous: 3.5 mmol/L (ref 0.5–1.9)
Lactic Acid, Venous: 3.3 mmol/L (ref 0.5–1.9)

## 2023-06-27 LAB — MRSA NEXT GEN BY PCR, NASAL: MRSA by PCR Next Gen: NOT DETECTED

## 2023-06-27 LAB — MAGNESIUM: Magnesium: 2.1 mg/dL (ref 1.7–2.4)

## 2023-06-27 LAB — BRAIN NATRIURETIC PEPTIDE: B Natriuretic Peptide: 646.4 pg/mL — ABNORMAL HIGH (ref 0.0–100.0)

## 2023-06-27 LAB — PROCALCITONIN: Procalcitonin: 8.37 ng/mL

## 2023-06-27 LAB — CK: Total CK: 1439 U/L — ABNORMAL HIGH (ref 49–397)

## 2023-06-27 LAB — STREP PNEUMONIAE URINARY ANTIGEN: Strep Pneumo Urinary Antigen: NEGATIVE

## 2023-06-27 MED ORDER — SODIUM CHLORIDE 0.9 % IV SOLN
INTRAVENOUS | Status: AC | PRN
Start: 2023-06-27 — End: 2023-06-28

## 2023-06-27 MED ORDER — ORAL CARE MOUTH RINSE
15.0000 mL | OROMUCOSAL | Status: DC
  Administered 2023-06-27 – 2023-06-28 (×19): 15 mL via OROMUCOSAL

## 2023-06-27 MED ORDER — NOREPINEPHRINE 4 MG/250ML-% IV SOLN
INTRAVENOUS | Status: AC
  Filled 2023-06-27: qty 250

## 2023-06-27 MED ORDER — SODIUM CHLORIDE 0.9 % IV SOLN
2.0000 g | INTRAVENOUS | Status: DC
  Administered 2023-06-27: 2 g via INTRAVENOUS
  Filled 2023-06-27: qty 20

## 2023-06-27 MED ORDER — AMIODARONE HCL IN DEXTROSE 360-4.14 MG/200ML-% IV SOLN
60.0000 mg/h | INTRAVENOUS | Status: DC
  Administered 2023-06-28: 60 mg/h via INTRAVENOUS
  Administered 2023-06-28: 30 mg/h via INTRAVENOUS
  Administered 2023-06-28 – 2023-06-30 (×8): 60 mg/h via INTRAVENOUS
  Filled 2023-06-27 (×10): qty 200

## 2023-06-27 MED ORDER — HEPARIN (PORCINE) 25000 UT/250ML-% IV SOLN
2050.0000 [IU]/h | INTRAVENOUS | Status: DC
  Administered 2023-06-27: 1300 [IU]/h via INTRAVENOUS
  Administered 2023-06-28 (×2): 1750 [IU]/h via INTRAVENOUS
  Administered 2023-06-28: 1450 [IU]/h via INTRAVENOUS
  Administered 2023-06-29 (×2): 2050 [IU]/h via INTRAVENOUS
  Filled 2023-06-27 (×5): qty 250

## 2023-06-27 MED ORDER — MILRINONE LACTATE IN DEXTROSE 20-5 MG/100ML-% IV SOLN
0.1250 ug/kg/min | INTRAVENOUS | Status: DC
  Administered 2023-06-27 – 2023-06-28 (×3): 0.25 ug/kg/min via INTRAVENOUS
  Filled 2023-06-27 (×4): qty 100

## 2023-06-27 MED ORDER — SODIUM CHLORIDE 0.9 % IV SOLN
100.0000 mg | Freq: Two times a day (BID) | INTRAVENOUS | Status: AC
  Administered 2023-06-27 – 2023-07-01 (×10): 100 mg via INTRAVENOUS
  Filled 2023-06-27 (×10): qty 100

## 2023-06-27 MED ORDER — AMIODARONE HCL IN DEXTROSE 360-4.14 MG/200ML-% IV SOLN
60.0000 mg/h | INTRAVENOUS | Status: AC
  Administered 2023-06-27 (×2): 60 mg/h via INTRAVENOUS
  Filled 2023-06-27: qty 200

## 2023-06-27 MED ORDER — INFLUENZA VAC A&B SURF ANT ADJ 0.5 ML IM SUSY: 0.5000 mL | PREFILLED_SYRINGE | INTRAMUSCULAR | Status: DC | PRN

## 2023-06-27 MED ORDER — PNEUMOCOCCAL 20-VAL CONJ VACC 0.5 ML IM SUSY: 0.5000 mL | PREFILLED_SYRINGE | INTRAMUSCULAR | Status: DC | PRN

## 2023-06-27 MED ORDER — NOREPINEPHRINE 4 MG/250ML-% IV SOLN
0.0000 ug/min | INTRAVENOUS | Status: DC
  Administered 2023-06-27: 24 ug/min via INTRAVENOUS
  Administered 2023-06-27: 35 ug/min via INTRAVENOUS
  Administered 2023-06-27: 24 ug/min via INTRAVENOUS
  Administered 2023-06-27: 2 ug/min via INTRAVENOUS
  Administered 2023-06-27: 29 ug/min via INTRAVENOUS
  Administered 2023-06-27: 22 ug/min via INTRAVENOUS
  Filled 2023-06-27 (×6): qty 250

## 2023-06-27 MED ORDER — LORAZEPAM 2 MG/ML IJ SOLN
2.0000 mg | Freq: Once | INTRAMUSCULAR | Status: DC
Start: 2023-06-27 — End: 2023-06-27

## 2023-06-27 MED ORDER — SODIUM BICARBONATE 8.4 % IV SOLN
INTRAVENOUS | Status: AC
Start: 2023-06-27 — End: 2023-06-27
  Administered 2023-06-27: 100 meq via INTRAVENOUS
  Filled 2023-06-27: qty 50

## 2023-06-27 MED ORDER — PERFLUTREN LIPID MICROSPHERE
1.0000 mL | INTRAVENOUS | Status: AC | PRN
  Administered 2023-06-27: 4 mL via INTRAVENOUS

## 2023-06-27 MED ORDER — DEXMEDETOMIDINE HCL IN NACL 400 MCG/100ML IV SOLN
0.0000 ug/kg/h | INTRAVENOUS | Status: DC
  Administered 2023-06-27: 0.4 ug/kg/h via INTRAVENOUS
  Administered 2023-06-27: 0.5 ug/kg/h via INTRAVENOUS
  Administered 2023-06-28: 0.6 ug/kg/h via INTRAVENOUS
  Filled 2023-06-27 (×4): qty 100

## 2023-06-27 MED ORDER — NOREPINEPHRINE 16 MG/250ML-% IV SOLN
0.0000 ug/min | INTRAVENOUS | Status: DC
  Administered 2023-06-27: 20 ug/min via INTRAVENOUS
  Administered 2023-06-28: 8 ug/min via INTRAVENOUS
  Administered 2023-06-29: 5 ug/min via INTRAVENOUS
  Filled 2023-06-27 (×2): qty 250

## 2023-06-27 MED ORDER — FUROSEMIDE 10 MG/ML IJ SOLN
40.0000 mg | Freq: Once | INTRAMUSCULAR | Status: AC
  Administered 2023-06-27: 40 mg via INTRAVENOUS
  Filled 2023-06-27: qty 4

## 2023-06-27 MED ORDER — ORAL CARE MOUTH RINSE: 15.0000 mL | OROMUCOSAL | Status: DC | PRN

## 2023-06-27 MED ORDER — SODIUM BICARBONATE 8.4 % IV SOLN
INTRAVENOUS | Status: AC
  Administered 2023-06-27: 50 meq via INTRAVENOUS
  Filled 2023-06-27: qty 50

## 2023-06-27 MED ORDER — VASOPRESSIN 20 UNITS/100 ML INFUSION FOR SHOCK
0.0000 [IU]/min | INTRAVENOUS | Status: DC
  Administered 2023-06-27 (×4): 0.04 [IU]/min via INTRAVENOUS
  Administered 2023-06-28 (×2): 0.03 [IU]/min via INTRAVENOUS
  Administered 2023-06-28: 0.04 [IU]/min via INTRAVENOUS
  Filled 2023-06-27 (×8): qty 100

## 2023-06-27 MED ORDER — POTASSIUM CHLORIDE 20 MEQ PO PACK
40.0000 meq | PACK | Freq: Once | ORAL | Status: AC
  Administered 2023-06-27: 40 meq
  Filled 2023-06-27: qty 2

## 2023-06-27 MED ORDER — MIDAZOLAM HCL 2 MG/2ML IJ SOLN: 2.0000 mg | Freq: Once | INTRAMUSCULAR | Status: AC

## 2023-06-27 MED ORDER — MIDAZOLAM HCL 2 MG/2ML IJ SOLN
1.0000 mg | INTRAMUSCULAR | Status: DC | PRN
  Administered 2023-06-27: 2 mg via INTRAVENOUS
  Filled 2023-06-27 (×2): qty 2

## 2023-06-27 MED ORDER — SODIUM BICARBONATE 8.4 % IV SOLN: 50.0000 meq | Freq: Once | INTRAVENOUS | Status: AC

## 2023-06-27 MED ORDER — SODIUM BICARBONATE 8.4 % IV SOLN
100.0000 meq | Freq: Once | INTRAVENOUS | Status: AC
Start: 2023-06-27 — End: 2023-06-27

## 2023-06-27 MED ORDER — ATORVASTATIN CALCIUM 80 MG PO TABS
80.0000 mg | ORAL_TABLET | Freq: Every day | ORAL | Status: DC
Start: 2023-06-27 — End: 2023-06-30
  Administered 2023-06-27 – 2023-06-29 (×3): 80 mg
  Filled 2023-06-27 (×3): qty 1

## 2023-06-27 MED ORDER — HEPARIN BOLUS VIA INFUSION
4000.0000 [IU] | Freq: Once | INTRAVENOUS | Status: AC
  Administered 2023-06-27: 4000 [IU] via INTRAVENOUS
  Filled 2023-06-27: qty 4000

## 2023-06-27 MED ORDER — EPINEPHRINE HCL 5 MG/250ML IV SOLN IN NS
0.5000 ug/min | INTRAVENOUS | Status: DC
Start: 2023-06-27 — End: 2023-06-28
  Administered 2023-06-27: 0.5 ug/min via INTRAVENOUS
  Filled 2023-06-27: qty 250

## 2023-06-27 MED ORDER — LORAZEPAM 2 MG/ML IJ SOLN: 2.0000 mg | Freq: Once | INTRAMUSCULAR | Status: DC

## 2023-06-27 MED ORDER — MIDAZOLAM HCL 2 MG/2ML IJ SOLN
INTRAMUSCULAR | Status: AC
  Administered 2023-06-27: 2 mg via INTRAVENOUS
  Filled 2023-06-27: qty 2

## 2023-06-27 NOTE — Progress Notes (Signed)
 Patient seen and examined at bedside.  Neurologically he is not responsive to even painful stimuli.  Feet cold and mottled/discolored. Eyes open but not awake, not following any commands.   Metabolic acidosis, getting Bicarb drip and received Bicarb boluses Multiple pressors, Levophed , Epinephrine 4 mcg, Vasopressin 0.03u, a-line dampened, cuff pressures being used. Plan for CT head stat, likely hypoxic encephalopathy. Sister at bedside and she was updated.  I told her the prognosis was poor.She will call daughter to come here from [place]. The patient is critically ill with multiple organ system failure and requires high complexity decision making for assessment and support, frequent evaluation and titration of therapies, advanced monitoring, review of radiographic studies and interpretation of complex data.   Critical Care Time devoted to patient care services, exclusive of separately billable procedures, described in this note is 30 minutes.   Rozann Lesches, MD [facility] See Amion for pager  If no response to pager , please call [phone] until 7pm After 7:00 pm call Elink  [phone] 06/27/2023, 4:15 AM

## 2023-06-27 NOTE — Consult Note (Addendum)
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 arrest at home, > 20 minutes CPR.  He was shocked for VF and received epinephrine.  In the ER, patient had a brief PEA arrest.  Rhythm now stable. No STEMI so did not go to the cath lab.   He is intubated, on NE 24 and vasopressin 0.04.  Per report, he has followed commands but will not do so for me.  CVP 5, co-ox 56% with HS-TnI initially 112 => 3045.  Creatinine 2.8 from baseline around 2.1.   Venous dopplers done today showed bilateral DVTs mixed acute and chronic.  He has not been on anticoagulation at home.   I reviewed today's echo; EF 45%, diffuse hypokinesis, severe LVH, normal RV size with moderately decreased systolic function, small pericardial effusion, dilated IVC.   General: Eyes open on vent Neck: No JVD, no thyromegaly or thyroid nodule.  Lungs: Clear to auscultation bilaterally with normal respiratory effort. CV: Nondisplaced PMI.  Heart regular S1/S2, no S3/S4, no murmur.  No peripheral edema.  No carotid bruit.  Normal pedal pulses.  Abdomen: Soft, nontender, no hepatosplenomegaly, no  distention.  Skin: Intact without lesions or rashes.  Neurologic: Eyes open, does not respond to me.  Extremities: No clubbing or cyanosis.  HEENT: Normal.   1. Cardiac arrest: Witnessed arrest with bystander then EMS CPR.  >20 minutes CPR.  He was shocked for VF.  Had additional 4-5 minutes PEA arrest in ER. ?PE with aspiration given bilateral DVTs.  - Runs of PVCs, h/o AF => will add amiodarone gtt for now.  - Heparin gtt.  2. Cardiogenic shock: Echo today showed EF 45%, diffuse hypokinesis, severe LVH, normal RV size with moderately decreased systolic function, small pericardial effusion, dilated IVC.  Suspicion for PE as above. Tm 100.2 with PNA on CXR, could also have septic component.  He is on NE 24, vasopressin 0.04.  Co-ox marginal at 56%.  - Continue pressors as needed. - Will add milrinone 0.25 for RV support with marginal co-ox and moderate RV dysfunction on echo.  3. Acute HF with mid range EF: With prominent RV failure. Echo today showed EF 45%, diffuse hypokinesis, severe LVH, normal RV size with moderately decreased systolic  function, small pericardial effusion, dilated IVC. As above, worry for PE with RV failure. Baseline cardiomyopathy with severe LVH raises concern for cardiac amyloidosis.  Co-ox 56%, IVC dilated on echo though CVP only measures 5.  - Lasix 40 mg IV x 1 and follow response and CVP.  - Add milrinone 0.25 as above.  - If he recovers, should have workup for cardiac amyloidosis.  4. Venous thromboembolism: Bilateral DVTs, mixed acute and chronic.  He is not anticoagulated at home.  With RV failure on echo, strong suspicion for PE.  - Heparin gtt.  - Eventual V/Q scan. No CTA with AKI.  5. CAD: NSTEMI with HS-TnI 3045.  ?Demand ischemia from arrest vs ACS.  He has history of carotid disease. - ASA/Plavix to continue (on at home).  - Atorvastatin 80 daily.  - If he recovers, would favor coronary angiography.  6. Carotid stenosis: h/o CVA.  Endarterectomy in 2024.   7. Atrial fibrillation: Paroxysmal.  Currently in NSR.  Was not anticoagulated at home.  - Heparin gtt.  - Amiodarone gtt as above.  8. AKI on CKD 3b: Suspect ATN in setting of hypotension/arrest.  Follow closely.   CRITICAL CARE Performed by: Marca Ancona  Total critical care time: 60 minutes  Critical care time was exclusive of separately billable procedures and treating other patients.  Critical care was necessary to treat or prevent imminent or life-threatening deterioration.  Critical care was time spent personally by me on the following activities: development of treatment plan with patient and/or surrogate as well as nursing, discussions with consultants, evaluation of patient's response to treatment, examination of patient, obtaining history from patient or surrogate, ordering and performing treatments and interventions, ordering and review of laboratory studies, ordering and review of radiographic studies, pulse oximetry and re-evaluation of patient's condition.  Marca Ancona 06/27/2023 5:56 PM

## 2023-06-27 NOTE — TOC Initial Note (Signed)
 Transition of Care [facility]) - Initial/Assessment Note    Patient Details  Name: Billy [name] MRN: [id number] Date of Birth: 04-01-1951  [facility]) CM/SW Contact:    Gala Lewandowsky, RN Phone Number: 06/27/2023, 1:56 PM  Clinical Narrative:  Patient discussed in progression rounds this morning. Patient presented for cardiac arrest-intubated. Son at the bedside during the visit. Per son, patient was independent from home alone. Son states he lives about 30 minutes from his father. Son states patient was driving and getting to appointments without any issues before hospitalization. Case Manager will continue to follow for transition of care needs.                 Expected Discharge Plan:  (TBD) Barriers to Discharge: Continued Medical Work up  Expected Discharge Plan and Services   Discharge Planning Services: CM Consult   Living arrangements for the past 2 months: Single Family Home   Prior Living Arrangements/Services Living arrangements for the past 2 months: Single Family Home Lives with:: Self Patient language and need for interpreter reviewed:: Yes Do you feel safe going back to the place where you live?: Yes      Need for Family Participation in Patient Care: Yes (Comment) Care giver support system in place?: Yes (comment)   Criminal Activity/Legal Involvement Pertinent to Current Situation/Hospitalization: No - Comment as needed  Activities of Daily Living   ADL Screening (condition at time of admission) Independently performs ADLs?: Yes (appropriate for developmental age) Is the patient deaf or have difficulty hearing?: No Does the patient have difficulty seeing, even when wearing glasses/contacts?: No Does the patient have difficulty concentrating, remembering, or making decisions?: No  Permission Sought/Granted Permission sought to share information with : Family Supports, Case Manager (Son at the bedside)   Emotional Assessment Appearance::  Appears stated age Attitude/Demeanor/Rapport: Unable to Assess Affect (typically observed): Unable to Assess   Alcohol / Substance Use: Not Applicable Psych Involvement: No (comment)  Admission diagnosis:  Cardiac arrest (HCC) [I46.9] Abnormal EKG [R94.31] Hypothermia, initial encounter [T68.XXXA] Patient Active Problem List   Diagnosis Date Noted   Cardiac arrest (HCC) 06/26/2023   Gastritis 03/12/2023   Acute on chronic diastolic CHF (congestive heart failure) (HCC) 03/10/2023   Carotid artery stenosis 10/13/2022   Carotid artery stenosis, asymptomatic, right 10/13/2022   Asymptomatic carotid artery stenosis, left 07/28/2022   Biceps tendonitis on right    Tear of right supraspinatus tendon    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    PAF (paroxysmal atrial fibrillation) (HCC) 09/13/2018   Upper GI bleeding 07/27/2018   History of non-ST elevation myocardial infarction (NSTEMI) 06/15/2018   AKI (acute kidney injury) (HCC) 04/11/2018   Gastrointestinal hemorrhage with melena 04/11/2018   Acute blood loss anemia 04/11/2018   Metabolic acidosis, normal anion gap (NAG) 04/11/2018   Carotid artery disease (HCC) 01/17/2018   Hyperlipidemia 01/17/2018   Peripheral neuropathy 12/25/2017   Hypertension 12/12/2017   Transient neurologic deficit 12/12/2017   History of CVA (cerebrovascular accident) 12/12/2017   Transient ischemic attack    Hypertensive 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 CHF 04/20/2017   History of DVT (deep vein thrombosis) 04/20/2017   Hematuria 04/20/2017   CKD stage 3b, GFR 30-44 ml/min (HCC) 04/20/2017   PCP:  [name], MD Pharmacy:   [id number] - [place], [place] - [address] AT [facility] [address] [place] [address] Phone: [phone] Fax: [phone]  [facility] [address] [place] [address] Phone: [phone]  Fax: [phone]  Social Drivers of Health (SDOH) Social History: SDOH Screenings   Food Insecurity: No Food Insecurity (06/27/2023)  Housing: Unknown (06/27/2023)  Transportation Needs: No Transportation Needs (06/27/2023)  Utilities: Not At Risk (06/27/2023)  Depression (PHQ2-9): Low Risk  (12/01/2020)  Tobacco Use: Low Risk  (03/27/2023)   Readmission Risk Interventions    06/27/2023    1:52 PM 10/14/2022   11:29 AM  Readmission Risk Prevention Plan  Post Dischage Appt  Complete  Medication Screening  Complete  Transportation Screening Complete Complete  HRI or Home Care Consult Complete   Social Work Consult for Recovery Care Planning/Counseling Complete   Palliative Care Screening Not Applicable   Medication Review Oceanographer) Referral to Pharmacy

## 2023-06-27 NOTE — Progress Notes (Signed)
  Echocardiogram 2D Echocardiogram has been performed.  Leda Roys RDCS 06/27/2023, 9:29 AM
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
## 2023-06-27 NOTE — Procedures (Signed)
 Arterial Line Insertion Start/End3/02/2024 2:30 AM  Patient location: ICU. Preanesthetic checklist: patient identified, IV checked, site marked, risks and benefits discussed, surgical consent, monitors and equipment checked, pre-op evaluation and timeout performed Left, radial was placed Catheter size: 20 G Hand hygiene performed , maximum sterile barriers used  and Seldinger technique used Allen's test indicative of satisfactory collateral circulation Attempts: 1 Procedure performed without using ultrasound guided technique. Following insertion, Biopatch. Post procedure assessment: normal  Patient tolerated the procedure well with no immediate complications.

## 2023-06-27 NOTE — Progress Notes (Signed)
 Afternoon rounds: Echo shows by ventricular failure.  Lower extremity Doppler positive for DVT in right popliteal left proximal profunda and femoral vein, left popliteal, left middle and distal femoral vein.  Likely PE and then aspirated.  His Co. ox is low, we had heart failure evaluate the patient.  Will start on milrinone.  Starting on heparin.

## 2023-06-27 NOTE — Plan of Care (Signed)

## 2023-06-27 NOTE — Progress Notes (Signed)
 Pt transported to CT from 2H08 to CT-2 and back, with RN at bedside. No complications at this time.

## 2023-06-27 NOTE — Progress Notes (Signed)
 Bilateral lower extremity venous duplex has been completed.  Results can be found in chart review under CV Proc.  06/27/2023 3:26 PM  Fernande Bras, RVT.
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
## 2023-06-28 ENCOUNTER — Ambulatory Visit: Payer: PPO | Admitting: Cardiovascular Disease

## 2023-06-28 ENCOUNTER — Encounter (HOSPITAL_COMMUNITY): Payer: Self-pay | Admitting: Pulmonary Disease

## 2023-06-28 ENCOUNTER — Inpatient Hospital Stay (HOSPITAL_COMMUNITY)

## 2023-06-28 DIAGNOSIS — I469 Cardiac arrest, cause unspecified: Secondary | ICD-10-CM | POA: Diagnosis not present

## 2023-06-28 DIAGNOSIS — N179 Acute kidney failure, unspecified: Secondary | ICD-10-CM | POA: Diagnosis not present

## 2023-06-28 DIAGNOSIS — J9601 Acute respiratory failure with hypoxia: Secondary | ICD-10-CM | POA: Diagnosis not present

## 2023-06-28 LAB — CBC
HCT: 30.4 % — ABNORMAL LOW (ref 39.0–52.0)
Hemoglobin: 9.8 g/dL — ABNORMAL LOW (ref 13.0–17.0)
MCH: 25 pg — ABNORMAL LOW (ref 26.0–34.0)
MCHC: 32.2 g/dL (ref 30.0–36.0)
MCV: 77.6 fL — ABNORMAL LOW (ref 80.0–100.0)
Platelets: 238 10*3/uL (ref 150–400)
RBC: 3.92 MIL/uL — ABNORMAL LOW (ref 4.22–5.81)
RDW: 16.6 % — ABNORMAL HIGH (ref 11.5–15.5)
WBC: 12.8 10*3/uL — ABNORMAL HIGH (ref 4.0–10.5)
nRBC: 0.5 % — ABNORMAL HIGH (ref 0.0–0.2)

## 2023-06-28 LAB — KAPPA/LAMBDA LIGHT CHAINS
Kappa free light chain: 54.3 mg/L — ABNORMAL HIGH (ref 3.3–19.4)
Kappa, lambda light chain ratio: 1.27 (ref 0.26–1.65)
Lambda free light chains: 42.6 mg/L — ABNORMAL HIGH (ref 5.7–26.3)

## 2023-06-28 LAB — LEGIONELLA PNEUMOPHILA SEROGP 1 UR AG: L. pneumophila Serogp 1 Ur Ag: NEGATIVE

## 2023-06-28 LAB — LACTIC ACID, PLASMA: Lactic Acid, Venous: 2 mmol/L (ref 0.5–1.9)

## 2023-06-28 LAB — MAGNESIUM: Magnesium: 1.7 mg/dL (ref 1.7–2.4)

## 2023-06-28 LAB — BASIC METABOLIC PANEL
Anion gap: 15 (ref 5–15)
BUN: 55 mg/dL — ABNORMAL HIGH (ref 8–23)
CO2: 20 mmol/L — ABNORMAL LOW (ref 22–32)
Calcium: 7.7 mg/dL — ABNORMAL LOW (ref 8.9–10.3)
Chloride: 108 mmol/L (ref 98–111)
Creatinine, Ser: 2.7 mg/dL — ABNORMAL HIGH (ref 0.61–1.24)
GFR, Estimated: 25 mL/min — ABNORMAL LOW (ref >60)
Glucose, Bld: 150 mg/dL — ABNORMAL HIGH (ref 70–99)
Potassium: 3.5 mmol/L (ref 3.5–5.1)
Sodium: 143 mmol/L (ref 135–145)

## 2023-06-28 LAB — GLUCOSE, CAPILLARY
Glucose-Capillary: 108 mg/dL — ABNORMAL HIGH (ref 70–99)
Glucose-Capillary: 111 mg/dL — ABNORMAL HIGH (ref 70–99)
Glucose-Capillary: 111 mg/dL — ABNORMAL HIGH (ref 70–99)
Glucose-Capillary: 126 mg/dL — ABNORMAL HIGH (ref 70–99)
Glucose-Capillary: 138 mg/dL — ABNORMAL HIGH (ref 70–99)
Glucose-Capillary: 83 mg/dL (ref 70–99)

## 2023-06-28 LAB — HEPARIN LEVEL (UNFRACTIONATED)
Heparin Unfractionated: 0.14 [IU]/mL — ABNORMAL LOW (ref 0.30–0.70)
Heparin Unfractionated: 0.15 [IU]/mL — ABNORMAL LOW (ref 0.30–0.70)
Heparin Unfractionated: 0.21 [IU]/mL — ABNORMAL LOW (ref 0.30–0.70)
Heparin Unfractionated: <0.1 [IU]/mL — ABNORMAL LOW (ref 0.30–0.70)

## 2023-06-28 LAB — PHOSPHORUS: Phosphorus: 3.3 mg/dL (ref 2.5–4.6)

## 2023-06-28 LAB — COOXEMETRY PANEL
Carboxyhemoglobin: 1 % (ref 0.5–1.5)
Methemoglobin: 0.7 % (ref 0.0–1.5)
O2 Saturation: 65.5 %
Total hemoglobin: 10 g/dL — ABNORMAL LOW (ref 12.0–16.0)

## 2023-06-28 MED ORDER — HEPARIN BOLUS VIA INFUSION
2000.0000 [IU] | Freq: Once | INTRAVENOUS | Status: AC
  Administered 2023-06-28: 2000 [IU] via INTRAVENOUS
  Filled 2023-06-28: qty 2000

## 2023-06-28 MED ORDER — POTASSIUM CHLORIDE 20 MEQ PO PACK
40.0000 meq | PACK | Freq: Once | ORAL | Status: AC
Start: 2023-06-28 — End: 2023-06-28
  Administered 2023-06-28: 40 meq
  Filled 2023-06-28: qty 2

## 2023-06-28 MED ORDER — ORAL CARE MOUTH RINSE: 15.0000 mL | OROMUCOSAL | Status: DC | PRN

## 2023-06-28 MED ORDER — SODIUM CHLORIDE 0.9 % IV SOLN
3.0000 g | Freq: Two times a day (BID) | INTRAVENOUS | Status: DC
  Administered 2023-06-28: 3 g via INTRAVENOUS
  Filled 2023-06-28: qty 8

## 2023-06-28 MED ORDER — FUROSEMIDE 10 MG/ML IJ SOLN
80.0000 mg | Freq: Once | INTRAMUSCULAR | Status: AC
  Administered 2023-06-28: 80 mg via INTRAVENOUS

## 2023-06-28 MED ORDER — HEPARIN BOLUS VIA INFUSION
2000.0000 [IU] | Freq: Once | INTRAVENOUS | Status: AC
Start: 2023-06-28 — End: 2023-06-28
  Administered 2023-06-28: 2000 [IU] via INTRAVENOUS
  Filled 2023-06-28: qty 2000

## 2023-06-28 MED ORDER — MAGNESIUM SULFATE 2 GM/50ML IV SOLN
2.0000 g | Freq: Once | INTRAVENOUS | Status: AC
  Administered 2023-06-28: 2 g via INTRAVENOUS
  Filled 2023-06-28: qty 50

## 2023-06-28 MED ORDER — POTASSIUM CHLORIDE 20 MEQ PO PACK
20.0000 meq | PACK | Freq: Once | ORAL | Status: AC
  Administered 2023-06-28: 20 meq
  Filled 2023-06-28: qty 1

## 2023-06-28 MED ORDER — FUROSEMIDE 10 MG/ML IJ SOLN
80.0000 mg | Freq: Two times a day (BID) | INTRAMUSCULAR | Status: DC
Start: 2023-06-28 — End: 2023-06-28
  Filled 2023-06-28: qty 8

## 2023-06-28 MED ORDER — SODIUM CHLORIDE 0.9 % IV SOLN
2.0000 g | INTRAVENOUS | Status: AC
  Administered 2023-06-29 – 2023-07-01 (×3): 2 g via INTRAVENOUS
  Filled 2023-06-28 (×3): qty 20

## 2023-06-28 MED ORDER — AMIODARONE IV BOLUS ONLY 150 MG/100ML
150.0000 mg | Freq: Once | INTRAVENOUS | Status: AC
  Administered 2023-06-28: 150 mg via INTRAVENOUS

## 2023-06-28 MED ORDER — ORAL CARE MOUTH RINSE
15.0000 mL | OROMUCOSAL | Status: DC
  Administered 2023-06-28: 15 mL via OROMUCOSAL
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
## 2023-06-28 NOTE — Procedures (Signed)
 Extubation Procedure Note  Patient Details:   Name: Billy [name] DOB: 01-27-1951 MRN: [id number]   Airway Documentation:    Vent end date: 06/28/23 Vent end time: 1207   Evaluation  O2 sats: stable throughout Complications: No apparent complications Patient did tolerate procedure well. Bilateral Breath Sounds: Diminished   Yes Pt extubated to 2L [name]. Positive cuff leak noted.  Allen Norris 06/28/2023, 12:10 PM
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
## 2023-06-28 NOTE — H&P (View-Only) (Signed)
 Advanced Heart Failure Rounding Note  Cardiologist: Nanetta Batty, MD  Chief Complaint:  Subjective:    BP elevated overnight. SBP 120-160s On Milrinone 0.25 + NE 8 + VP 0.03 1L UOP (net + 2.5L). 600cc out of NGT.  Plans to extubated today. Following commands.   Objective:    Weight Range: 80.3 kg Body mass index is 23.36 kg/m.   Vital Signs:   Temp:  [97.7 F (36.5 C)-100.6 F (38.1 C)] 97.9 F (36.6 C) (03/12 0715) Pulse Rate:  [58-115] 70 (03/12 0715) Resp:  [0-28] 28 (03/12 0715) BP: (86-183)/(49-98) 164/65 (03/12 0715) SpO2:  [94 %-100 %] 100 % (03/12 0715) Arterial Line BP: (77-187)/(43-98) 123/61 (03/12 0715) FiO2 (%):  [40 %-80 %] 40 % (03/12 0816) Weight:  [80.3 kg] 80.3 kg (03/12 0400) Last BM Date : 06/27/23  Weight change: Filed Weights   06/27/23 0500 06/27/23 1300 06/28/23 0400  Weight: 80.3 kg 80.3 kg 80.3 kg   Intake/Output:  Intake/Output Summary (Last 24 hours) at 06/28/2023 0855 Last data filed at 06/28/2023 0528 Gross per 24 hour  Intake 4166.05 ml  Output 1600 ml  Net 2566.05 ml    Physical Exam    CVP 4-5 General: Elderly appearing Cardiac: JVP ~8cm. S1 and S2 present. No murmurs or rub. Abdomen: Soft, non-tender, non-distended. + BS. Extremities: Warm and dry.   1+ generalized edema.  Neuro: Sedated/Intubated Lines/Devices:  ETT, OGT, Foley, aline, LIJ  Telemetry   SR in 80-90 with frequent PACs (personally reviewed)  EKG    N/A  Labs    CBC Recent Labs    06/26/23 2039 06/27/23 0315 06/27/23 0333 06/27/23 0510 06/28/23 0350  WBC 18.1*  --  17.2*  --  12.8*  NEUTROABS 11.0*  --   --   --   --   HGB 11.2*   < > 10.5* 10.9* 9.8*  HCT 38.9*   < > 33.3* 32.0* 30.4*  MCV 87.8  --  79.5*  --  77.6*  PLT 341  --  383  --  238   < > = values in this interval not displayed.   Basic Metabolic Panel Recent Labs    29/52/84 0333 06/27/23 0510 06/27/23 1116 06/28/23 0350  NA 147*   < > 144 143  K 4.4   < > 3.5  3.5  CL 113*  --  110 108  CO2 21*  --  20* 20*  GLUCOSE 213*  --  150* 150*  BUN 51*  --  56* 55*  CREATININE 2.41*  --  2.80* 2.70*  CALCIUM 7.5*  --  7.7* 7.7*  MG 2.1  --   --  1.7  PHOS 7.1*  --   --  3.3   < > = values in this interval not displayed.   Liver Function Tests Recent Labs    06/27/23 0333  AST 78*  ALT 57*  ALKPHOS 63  BILITOT 0.7  PROT 4.5*  ALBUMIN 1.7*   No results for input(s): "LIPASE", "AMYLASE" in the last 72 hours. Cardiac Enzymes Recent Labs    06/27/23 0333  CKTOTAL 1,439*   BNP: BNP (last 3 results) Recent Labs    03/09/23 1544 03/11/23 0218 06/27/23 1116  BNP 660.9* 491.3* 646.4*   ProBNP (last 3 results) No results for input(s): "PROBNP" in the last 8760 hours.  D-Dimer No results for input(s): "DDIMER" in the last 72 hours. Hemoglobin A1C No results for input(s): "HGBA1C" in the last 72 hours.  Fasting Lipid Panel No results for input(s): "CHOL", "HDL", "LDLCALC", "TRIG", "CHOLHDL", "LDLDIRECT" in the last 72 hours. Thyroid Function Tests No results for input(s): "TSH", "T4TOTAL", "T3FREE", "THYROIDAB" in the last 72 hours.  Invalid input(s): "FREET3"  Other results:  Imaging   VAS [place] LOWER EXTREMITY VENOUS (DVT) Result Date: 06/27/2023  Lower Venous DVT Study Patient Name:  Billy [name]  Date of Exam:   06/27/2023 Medical Rec #: [id number]   Accession #:    [id number] Date of Birth: December 24, 1950   Patient Gender: M Patient Age:   73 years Exam Location:  [facility] Procedure:      VAS [place] LOWER EXTREMITY VENOUS (DVT) Referring Phys: Levon Hedger --------------------------------------------------------------------------------  Indications: Edema, SOB, and Cardiac arrest, recent fall/injury, CHF, A-fib.  Limitations: Patient extremely still limited popliteal and lower calf views. Comparison Study: Previous study on 8.27.2019 Performing Technologist: Fernande Bras  Examination Guidelines: A complete evaluation includes  B-mode imaging, spectral Doppler, color Doppler, and power Doppler as needed of all accessible portions of each vessel. Bilateral testing is considered an integral part of a complete examination. Limited examinations for reoccurring indications may be performed as noted. The reflux portion of the exam is performed with the patient in reverse Trendelenburg.  +---------+---------------+---------+-----------+----------+-------------------+ RIGHT    CompressibilityPhasicitySpontaneityPropertiesThrombus Aging      +---------+---------------+---------+-----------+----------+-------------------+ CFV      Full           Yes      Yes                                      +---------+---------------+---------+-----------+----------+-------------------+ SFJ      Full           Yes      Yes                                      +---------+---------------+---------+-----------+----------+-------------------+ FV Prox  Full                                                             +---------+---------------+---------+-----------+----------+-------------------+ FV Mid   Full                                                             +---------+---------------+---------+-----------+----------+-------------------+ FV DistalFull                                                             +---------+---------------+---------+-----------+----------+-------------------+ PFV      Full                                                             +---------+---------------+---------+-----------+----------+-------------------+  POP      Partial        Yes      Yes                  Chronic             +---------+---------------+---------+-----------+----------+-------------------+ PTV                                                   Not well                                                                  visualized, patent                                                         by color.           +---------+---------------+---------+-----------+----------+-------------------+ PERO                                                  Not well                                                                  visualized, patent                                                        by color.           +---------+---------------+---------+-----------+----------+-------------------+   +---------+---------------+---------+-----------+----------+-------------------+ LEFT     CompressibilityPhasicitySpontaneityPropertiesThrombus Aging      +---------+---------------+---------+-----------+----------+-------------------+ CFV      Full           Yes      Yes                  Intimal thickening. +---------+---------------+---------+-----------+----------+-------------------+ SFJ      Full           Yes      Yes                                      +---------+---------------+---------+-----------+----------+-------------------+ FV Prox  Partial        Yes      Yes                                      +---------+---------------+---------+-----------+----------+-------------------+  FV Mid   Partial        No       No                                       +---------+---------------+---------+-----------+----------+-------------------+ FV DistalPartial        Yes      Yes                                      +---------+---------------+---------+-----------+----------+-------------------+ PFV      Partial        Yes      Yes                                      +---------+---------------+---------+-----------+----------+-------------------+ POP      Partial        Yes      Yes                                      +---------+---------------+---------+-----------+----------+-------------------+ PTV                                                   Not well visualized  +---------+---------------+---------+-----------+----------+-------------------+ PERO                                                  Not well visualized +---------+---------------+---------+-----------+----------+-------------------+ EIV                     Yes      Yes                                      +---------+---------------+---------+-----------+----------+-------------------+ Only one of the paired mid femoral veins is thrombosed.    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  - No cystic structure found in the popliteal fossa.  LEFT: - Findings consistent with acute deep vein thrombosis involving the left proximal profunda vein, and left proximal femoral vein.  - Findings consistent with age indeterminate deep vein thrombosis involving the left popliteal vein, and left middle and distal femoral vein.  - No cystic structure found in the popliteal fossa.  *See table(s) above for measurements and observations. Electronically signed by Lemar Livings MD on 06/27/2023 at 4:57:20 PM.    Final    ECHOCARDIOGRAM COMPLETE Result Date: 06/27/2023    ECHOCARDIOGRAM REPORT   Patient Name:   Billy [name] Date of Exam: 06/27/2023 Medical Rec #:  [id number]  Height:       73.0 in Accession #:    [id number] Weight:       177.0 lb Date of Birth:  [birth year]  BSA:          2.043 m Patient Age:    28  years   BP:           115/70 mmHg Patient Gender: M          HR:           64 bpm. Exam Location:  Inpatient Procedure: 2D Echo, Cardiac Doppler, Color Doppler and Intracardiac            Opacification Agent (Both Spectral and Color Flow Doppler were            utilized during procedure). Indications:    Cardiac Arrest I46.9  History:        Patient has prior history of Echocardiogram examinations, most                 recent 03/10/2023. Previous Myocardial Infarction, TIA; Risk                 Factors:Hypertension.  Sonographer:    Harriette Bouillon RDCS  Referring Phys: [id number] NAVEED [facility] IMPRESSIONS  1. Left ventricular ejection fraction, by estimation, is 55%. The left ventricle has severely decreased function. The left ventricle has no regional wall motion abnormalities. There is severe concentric left ventricular hypertrophy. Left ventricular diastolic parameters are consistent with Grade II diastolic dysfunction (pseudonormalization). There is the interventricular septum is flattened in systole and diastole, consistent with right ventricular pressure and volume overload.  2. Right ventricular systolic function is severely reduced. The right ventricular size is normal. Tricuspid regurgitation signal is inadequate for assessing PA pressure.  3. Right atrial size was mildly dilated.  4. A small pericardial effusion is present. The pericardial effusion is surrounding the apex.  5. The mitral valve is normal in structure. Trivial mitral valve regurgitation. No evidence of mitral stenosis.  6. The aortic valve is tricuspid. There is mild calcification of the aortic valve. Aortic valve regurgitation is not visualized. No aortic stenosis is present.  7. The inferior vena cava is dilated in size with <50% respiratory variability, suggesting right atrial pressure of 15 mmHg. Conclusion(s)/Recommendation(s): There is severe LVH and the LV cavity seems underfilled. There is moderate to severe RV dysfunction with flattening of the interventricular septum suggest or R-sided pressure volume overload. FINDINGS  Left Ventricle: Left ventricular ejection fraction, by estimation, is 55%. The left ventricle has severely decreased function. The left ventricle has no regional wall motion abnormalities. Definity contrast agent was given IV to delineate the left ventricular endocardial borders. The left ventricular internal cavity size was normal in size. There is severe concentric left ventricular hypertrophy. The interventricular septum is flattened in systole and diastole,  consistent with right ventricular pressure and volume overload. Left ventricular diastolic parameters are consistent with Grade II diastolic dysfunction (pseudonormalization). Right Ventricle: The right ventricular size is normal. No increase in right ventricular wall thickness. Right ventricular systolic function is severely reduced. Tricuspid regurgitation signal is inadequate for assessing PA pressure. Left Atrium: Left atrial size was normal in size. Right Atrium: Right atrial size was mildly dilated. Pericardium: A small pericardial effusion is present. The pericardial effusion is surrounding the apex. Mitral Valve: The mitral valve is normal in structure. Trivial mitral valve regurgitation. No evidence of mitral valve stenosis. Tricuspid Valve: The tricuspid valve is normal in structure. Tricuspid valve regurgitation is trivial. No evidence of tricuspid stenosis. Aortic Valve: The aortic valve is tricuspid. There is mild calcification of the aortic valve. Aortic valve regurgitation is not visualized. No aortic stenosis is present. Pulmonic Valve: The pulmonic valve was normal in structure. Pulmonic valve regurgitation is  not visualized. No evidence of pulmonic stenosis. Aorta: The aortic root is normal in size and structure. Venous: The inferior vena cava is dilated in size with less than 50% respiratory variability, suggesting right atrial pressure of 15 mmHg. IAS/Shunts: No atrial level shunt detected by color flow Doppler.  LEFT VENTRICLE PLAX 2D LVIDd:         3.40 cm     Diastology LVIDs:         2.00 cm     LV e' medial:    3.59 cm/s LV PW:         1.80 cm     LV E/e' medial:  19.1 LV IVS:        1.90 cm     LV e' lateral:   5.66 cm/s LVOT diam:     2.30 cm     LV E/e' lateral: 12.1 LV SV:         41 LV SV Index:   20 LVOT Area:     4.15 cm  LV Volumes (MOD) LV vol d, MOD A2C: 71.1 ml LV vol d, MOD A4C: 78.0 ml LV vol s, MOD A2C: 43.1 ml LV vol s, MOD A4C: 48.0 ml LV SV MOD A2C:     28.0 ml LV SV MOD  A4C:     78.0 ml LV SV MOD BP:      32.6 ml RIGHT VENTRICLE            IVC RV S prime:     7.07 cm/s  IVC diam: 2.10 cm TAPSE (M-mode): 0.8 cm LEFT ATRIUM             Index        RIGHT ATRIUM           Index LA diam:        3.40 cm 1.66 cm/m   RA Area:     16.00 cm LA Vol (A2C):   59.2 ml 28.98 ml/m  RA Volume:   44.50 ml  21.78 ml/m LA Vol (A4C):   23.5 ml 11.50 ml/m LA Biplane Vol: 40.0 ml 19.58 ml/m  AORTIC VALVE LVOT Vmax:   76.70 cm/s LVOT Vmean:  44.400 cm/s LVOT VTI:    0.098 m  AORTA Ao Root diam: 3.00 cm Ao Asc diam:  3.00 cm MITRAL VALVE MV Area (PHT): 3.53 cm    SHUNTS MV Decel Time: 215 msec    Systemic VTI:  0.10 m MV E velocity: 68.50 cm/s  Systemic Diam: 2.30 cm MV A velocity: 41.50 cm/s MV E/A ratio:  1.65 Arvilla Meres MD Electronically signed by Arvilla Meres MD Signature Date/Time: 06/27/2023/9:52:54 AM    Final    Medications:    Scheduled Medications:  aspirin  81 mg Per Tube Daily   atorvastatin  80 mg Per Tube Daily   Chlorhexidine Gluconate Cloth  6 each Topical Daily   clopidogrel  75 mg Per Tube Daily   ezetimibe  10 mg Per Tube Daily   insulin aspart  0-9 Units Subcutaneous Q4H   mouth rinse  15 mL Mouth Rinse Q2H   pantoprazole (PROTONIX) IV  40 mg Intravenous QHS   sodium chloride flush  10-40 mL Intracatheter Q12H   Infusions:  amiodarone 30 mg/hr (06/28/23 0528)   ampicillin-sulbactam (UNASYN) IV     dexmedetomidine (PRECEDEX) IV infusion 0.6 mcg/kg/hr (06/28/23 0528)   doxycycline (VIBRAMYCIN) IV Stopped (06/27/23 2329)   epinephrine Stopped (06/27/23 0826)   fentaNYL infusion INTRAVENOUS 125  mcg/hr (06/28/23 4540)   heparin 1,450 Units/hr (06/28/23 0528)   milrinone 0.25 mcg/kg/min (06/28/23 0528)   norepinephrine (LEVOPHED) Adult infusion 8 mcg/min (06/28/23 0528)   vasopressin 0.03 Units/min (06/28/23 0845)    PRN Medications: docusate, fentaNYL, influenza vaccine adjuvanted, midazolam, mouth rinse, pneumococcal 20-valent conjugate vaccine,  polyethylene glycol, sodium chloride flush  Patient Profile   Billy [name] is a 73 yo male with PMH of HTN, HLD, prior DVT, CVA, paroxysmal atrial fibrillation, CKD stage IIIb, carotid artery disease s/p endarterectomy, and chronic diastolic CHF.   Assessment/Plan   Cardiogenic shock Biventricular Heart Failure NSTEMI - Lactic acid 6.5 (on admission)>3.3 with high pressor requirement.  - Prev nuclear stress test 3/24: prior infarct with peri-infarct ischemia. - Echo today with EF 45-50% with severe LVH (on Dr. Kathlyn Sacramento read), severely reduced LV and RV function, G2DD, small pericardial effusion - previously mentioned concern for cardiac amyloidosis in 2019 (per EHR), never worked up. Sending multiple myeloma panel, immunofixation, kappa lambda light chain, SPEP, IFE. - will need cMRI to r/o amyloidosis after extubation - unable to perform LHC with renal function - Co-ox 66. Continue Milrinone 0.25 mcg/kg/min to assist with RV and renal function - CVP 5, however up 2.5L and generalized edema. Give Lasix 80 mg IV bid today - hypertensive, wean NE + VP for BP as able - continue ASA 81 mg, plavix 75 mg, atorva 80 mg , zetia 10 mg daily   Witnessed OOH Arrest Acute Hypoxic Encephalopathy Concern for PE - witnessed arrest with bystander CPR + EMS CPR and field intubation with ROSC ~62min downtime - second PEA arrest in ED requiring 1 round CPR and Epi - CT head negative for acute findings. Following commands - unknown cause, concern for PE - concern for CAP with elevated WBC & Tmax 100.2. Afebrile today. Unasyn per CCM.  - + for bilateral DVTs (chronic R pop vein, acute L profunda and proxFem vein, L pop and mid to distal fem vein)  - Unable to get CTPE with renal function. V/Q scan once extubated. - continue heparin gtt - Wean/extubate per CCM   Paroxysmal Atrial Fibrillation - s/p loop recorder insertion in 2019 - frequent PACs/PVCs on tele - continue amio gtt while on milrinone -  continue heparin gtt   AKI on CKD 3b - Cr ~2-2.2 - now 2.4>2.8>2.7 - milrinone + diuresis as able   Carotid Stenosis H/o CVA in 2019 - s/p R endarterectomy in 6/24   Length of Stay: 2  CRITICAL CARE Performed by: [place] Lee  Total critical care time: 15 minutes  Critical care time was exclusive of separately billable procedures and treating other patients.  Critical care was necessary to treat or prevent imminent or life-threatening deterioration.  Critical care was time spent personally by me on the following activities: development of treatment plan with patient and/or surrogate as well as nursing, discussions with consultants, evaluation of patient's response to treatment, examination of patient, obtaining history from patient or surrogate, ordering and performing treatments and interventions, ordering and review of laboratory studies, ordering and review of radiographic studies, pulse oximetry and re-evaluation of patient's condition.  [place] Lee, NP  06/28/2023, 8:55 AM  Advanced Heart Failure Team Pager [phone] (M-F; 7a - 5p)  Please contact [facility] for night-coverage after hours (5p -7a ) and weekends on [email or web address]  Patient seen with NP.  I formulated the plan and agree with the above note.   He is following commands this morning.  He is on milrinone  0.25, NE 8, vasopressin 0.03.  MAP is elevated today.  Co-ox 65%, CVP about 6.   PCT 8.37, on Unasyn for possible aspiration PNA.   General: Awake on vent Neck: No JVD, no thyromegaly or thyroid nodule.  Lungs: Clear to auscultation bilaterally with normal respiratory effort. CV: Nondisplaced PMI.  Heart regular S1/S2, no S3/S4, no murmur.  1+ edema to knees.   Abdomen: Soft, nontender, no hepatosplenomegaly, no distention.  Skin: Intact without lesions or rashes.  Neurologic: Can follow commands Extremities: No clubbing or cyanosis.  HEENT: Normal.   1. Cardiac arrest: Witnessed arrest with bystander then EMS  CPR.  >20 minutes CPR.  He was shocked for VF.  Had additional 4-5 minutes PEA arrest in ER. ?PE with aspiration given bilateral DVTs.  - Runs of PVCs, h/o AF => continue amiodarone gtt for now.  - Heparin gtt.  2. Cardiogenic shock: Echo showed EF 45%, diffuse hypokinesis, severe LVH, normal RV size with moderately decreased systolic function, small pericardial effusion, dilated IVC.  Suspicion for PE as above.  PNA on CXR, could also have septic component.  He is on NE 8, vasopressin 0.03, milrinone 0.25.  Co-ox 65%.  - Wean pressors today, MAP is elevated.  - Continue milrinone for now for RV support.  3. Acute HF with mid range EF: With prominent RV failure. Echo showed EF 45%, diffuse hypokinesis, severe LVH, normal RV size with moderately decreased systolic function, small pericardial effusion, dilated IVC. As above, worry for PE with RV failure. Baseline cardiomyopathy with severe LVH raises concern for cardiac amyloidosis.  Co-ox 65%, CVP 6 today.  - CVP not elevated but I/Os positive and he is swollen on exam, will give Lasix 80 mg IV x 1 and follow response.  - Continue milrinone 0.25 as above for now for RV support.  - He should have workup for cardiac amyloidosis.  4. Venous thromboembolism: Bilateral DVTs, mixed acute and chronic.  He is not anticoagulated at home.  With RV failure on echo, strong suspicion for PE.  - Heparin gtt.  - Eventual V/Q scan. No CTA with AKI.  5. CAD: NSTEMI with HS-TnI 3045.  ?Demand ischemia from arrest vs ACS.  He has history of carotid disease. - ASA/Plavix to continue (on at home).  - Atorvastatin 80 daily.  - Avoid coronary angiography with no STEMI and AKI.  6. Carotid stenosis: h/o CVA.  Endarterectomy in 2024.  7. Atrial fibrillation: Paroxysmal.  Currently in NSR.  Was not anticoagulated at home.  - Heparin gtt.  - Amiodarone gtt as above.  8. AKI on CKD 3b: Suspect ATN in setting of hypotension/arrest.  Creatinine fairly stable today at 2.7.   9. ID: PCT 8.37, suspect PNA.   - Covering with Unasyn for possible aspiration PNA.   CRITICAL CARE Performed by: Marca Ancona  Total critical care time: 40 minutes  Critical care time was exclusive of separately billable procedures and treating other patients.  Critical care was necessary to treat or prevent imminent or life-threatening deterioration.  Critical care was time spent personally by me on the following activities: development of treatment plan with patient and/or surrogate as well as nursing, discussions with consultants, evaluation of patient's response to treatment, examination of patient, obtaining history from patient or surrogate, ordering and performing treatments and interventions, ordering and review of laboratory studies, ordering and review of radiographic studies, pulse oximetry and re-evaluation of patient's condition.   Marca Ancona 06/28/2023 9:44 AM

## 2023-06-28 NOTE — Progress Notes (Addendum)
 Advanced Heart Failure Rounding Note  Cardiologist: Nanetta Batty, MD  Chief Complaint:  Subjective:    BP elevated overnight. SBP 120-160s On Milrinone 0.25 + NE 8 + VP 0.03 1L UOP (net + 2.5L). 600cc out of NGT.  Plans to extubated today. Following commands.   Objective:    Weight Range: 80.3 kg Body mass index is 23.36 kg/m.   Vital Signs:   Temp:  [97.7 F (36.5 C)-100.6 F (38.1 C)] 97.9 F (36.6 C) (03/12 0715) Pulse Rate:  [58-115] 70 (03/12 0715) Resp:  [0-28] 28 (03/12 0715) BP: (86-183)/(49-98) 164/65 (03/12 0715) SpO2:  [94 %-100 %] 100 % (03/12 0715) Arterial Line BP: (77-187)/(43-98) 123/61 (03/12 0715) FiO2 (%):  [40 %-80 %] 40 % (03/12 0816) Weight:  [80.3 kg] 80.3 kg (03/12 0400) Last BM Date : 06/27/23  Weight change: Filed Weights   06/27/23 0500 06/27/23 1300 06/28/23 0400  Weight: 80.3 kg 80.3 kg 80.3 kg   Intake/Output:  Intake/Output Summary (Last 24 hours) at 06/28/2023 0855 Last data filed at 06/28/2023 0528 Gross per 24 hour  Intake 4166.05 ml  Output 1600 ml  Net 2566.05 ml    Physical Exam    CVP 4-5 General: Elderly appearing Cardiac: JVP ~8cm. S1 and S2 present. No murmurs or rub. Abdomen: Soft, non-tender, non-distended. + BS. Extremities: Warm and dry.   1+ generalized edema.  Neuro: Sedated/Intubated Lines/Devices:  ETT, OGT, Foley, aline, LIJ  Telemetry   SR in 80-90 with frequent PACs (personally reviewed)  EKG    N/A  Labs    CBC Recent Labs    06/26/23 2039 06/27/23 0315 06/27/23 0333 06/27/23 0510 06/28/23 0350  WBC 18.1*  --  17.2*  --  12.8*  NEUTROABS 11.0*  --   --   --   --   HGB 11.2*   < > 10.5* 10.9* 9.8*  HCT 38.9*   < > 33.3* 32.0* 30.4*  MCV 87.8  --  79.5*  --  77.6*  PLT 341  --  383  --  238   < > = values in this interval not displayed.   Basic Metabolic Panel Recent Labs    29/52/84 0333 06/27/23 0510 06/27/23 1116 06/28/23 0350  NA 147*   < > 144 143  K 4.4   < > 3.5  3.5  CL 113*  --  110 108  CO2 21*  --  20* 20*  GLUCOSE 213*  --  150* 150*  BUN 51*  --  56* 55*  CREATININE 2.41*  --  2.80* 2.70*  CALCIUM 7.5*  --  7.7* 7.7*  MG 2.1  --   --  1.7  PHOS 7.1*  --   --  3.3   < > = values in this interval not displayed.   Liver Function Tests Recent Labs    06/27/23 0333  AST 78*  ALT 57*  ALKPHOS 63  BILITOT 0.7  PROT 4.5*  ALBUMIN 1.7*   No results for input(s): "LIPASE", "AMYLASE" in the last 72 hours. Cardiac Enzymes Recent Labs    06/27/23 0333  CKTOTAL 1,439*   BNP: BNP (last 3 results) Recent Labs    03/09/23 1544 03/11/23 0218 06/27/23 1116  BNP 660.9* 491.3* 646.4*   ProBNP (last 3 results) No results for input(s): "PROBNP" in the last 8760 hours.  D-Dimer No results for input(s): "DDIMER" in the last 72 hours. Hemoglobin A1C No results for input(s): "HGBA1C" in the last 72 hours.  Fasting Lipid Panel No results for input(s): "CHOL", "HDL", "LDLCALC", "TRIG", "CHOLHDL", "LDLDIRECT" in the last 72 hours. Thyroid Function Tests No results for input(s): "TSH", "T4TOTAL", "T3FREE", "THYROIDAB" in the last 72 hours.  Invalid input(s): "FREET3"  Other results:  Imaging   VAS [place] LOWER EXTREMITY VENOUS (DVT) Result Date: 06/27/2023  Lower Venous DVT Study Patient Name:  Eual Sedita  Date of Exam:   06/27/2023 Medical Rec #: [id number]   Accession #:    [id number] Date of Birth: December 24, 1950   Patient Gender: M Patient Age:   21 years Exam Location:  [facility] Procedure:      VAS [place] LOWER EXTREMITY VENOUS (DVT) Referring Phys: Levon Hedger --------------------------------------------------------------------------------  Indications: Edema, SOB, and Cardiac arrest, recent fall/injury, CHF, A-fib.  Limitations: Patient extremely still limited popliteal and lower calf views. Comparison Study: Previous study on 8.27.2019 Performing Technologist: Fernande Bras  Examination Guidelines: A complete evaluation includes  B-mode imaging, spectral Doppler, color Doppler, and power Doppler as needed of all accessible portions of each vessel. Bilateral testing is considered an integral part of a complete examination. Limited examinations for reoccurring indications may be performed as noted. The reflux portion of the exam is performed with the patient in reverse Trendelenburg.  +---------+---------------+---------+-----------+----------+-------------------+ RIGHT    CompressibilityPhasicitySpontaneityPropertiesThrombus Aging      +---------+---------------+---------+-----------+----------+-------------------+ CFV      Full           Yes      Yes                                      +---------+---------------+---------+-----------+----------+-------------------+ SFJ      Full           Yes      Yes                                      +---------+---------------+---------+-----------+----------+-------------------+ FV Prox  Full                                                             +---------+---------------+---------+-----------+----------+-------------------+ FV Mid   Full                                                             +---------+---------------+---------+-----------+----------+-------------------+ FV DistalFull                                                             +---------+---------------+---------+-----------+----------+-------------------+ PFV      Full                                                             +---------+---------------+---------+-----------+----------+-------------------+  POP      Partial        Yes      Yes                  Chronic             +---------+---------------+---------+-----------+----------+-------------------+ PTV                                                   Not well                                                                  visualized, patent                                                         by color.           +---------+---------------+---------+-----------+----------+-------------------+ PERO                                                  Not well                                                                  visualized, patent                                                        by color.           +---------+---------------+---------+-----------+----------+-------------------+   +---------+---------------+---------+-----------+----------+-------------------+ LEFT     CompressibilityPhasicitySpontaneityPropertiesThrombus Aging      +---------+---------------+---------+-----------+----------+-------------------+ CFV      Full           Yes      Yes                  Intimal thickening. +---------+---------------+---------+-----------+----------+-------------------+ SFJ      Full           Yes      Yes                                      +---------+---------------+---------+-----------+----------+-------------------+ FV Prox  Partial        Yes      Yes                                      +---------+---------------+---------+-----------+----------+-------------------+  FV Mid   Partial        No       No                                       +---------+---------------+---------+-----------+----------+-------------------+ FV DistalPartial        Yes      Yes                                      +---------+---------------+---------+-----------+----------+-------------------+ PFV      Partial        Yes      Yes                                      +---------+---------------+---------+-----------+----------+-------------------+ POP      Partial        Yes      Yes                                      +---------+---------------+---------+-----------+----------+-------------------+ PTV                                                   Not well visualized  +---------+---------------+---------+-----------+----------+-------------------+ PERO                                                  Not well visualized +---------+---------------+---------+-----------+----------+-------------------+ EIV                     Yes      Yes                                      +---------+---------------+---------+-----------+----------+-------------------+ Only one of the paired mid femoral veins is thrombosed.    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  - No cystic structure found in the popliteal fossa.  LEFT: - Findings consistent with acute deep vein thrombosis involving the left proximal profunda vein, and left proximal femoral vein.  - Findings consistent with age indeterminate deep vein thrombosis involving the left popliteal vein, and left middle and distal femoral vein.  - No cystic structure found in the popliteal fossa.  *See table(s) above for measurements and observations. Electronically signed by Lemar Livings MD on 06/27/2023 at 4:57:20 PM.    Final    ECHOCARDIOGRAM COMPLETE Result Date: 06/27/2023    ECHOCARDIOGRAM REPORT   Patient Name:   Brix Blackwelder Date of Exam: 06/27/2023 Medical Rec #:  [id number]  Height:       73.0 in Accession #:    [id number] Weight:       177.0 lb Date of Birth:  [birth year]  BSA:          2.043 m Patient Age:    28  years   BP:           115/70 mmHg Patient Gender: M          HR:           64 bpm. Exam Location:  Inpatient Procedure: 2D Echo, Cardiac Doppler, Color Doppler and Intracardiac            Opacification Agent (Both Spectral and Color Flow Doppler were            utilized during procedure). Indications:    Cardiac Arrest I46.9  History:        Patient has prior history of Echocardiogram examinations, most                 recent 03/10/2023. Previous Myocardial Infarction, TIA; Risk                 Factors:Hypertension.  Sonographer:    Harriette Bouillon RDCS  Referring Phys: [id number] NAVEED [facility] IMPRESSIONS  1. Left ventricular ejection fraction, by estimation, is 55%. The left ventricle has severely decreased function. The left ventricle has no regional wall motion abnormalities. There is severe concentric left ventricular hypertrophy. Left ventricular diastolic parameters are consistent with Grade II diastolic dysfunction (pseudonormalization). There is the interventricular septum is flattened in systole and diastole, consistent with right ventricular pressure and volume overload.  2. Right ventricular systolic function is severely reduced. The right ventricular size is normal. Tricuspid regurgitation signal is inadequate for assessing PA pressure.  3. Right atrial size was mildly dilated.  4. A small pericardial effusion is present. The pericardial effusion is surrounding the apex.  5. The mitral valve is normal in structure. Trivial mitral valve regurgitation. No evidence of mitral stenosis.  6. The aortic valve is tricuspid. There is mild calcification of the aortic valve. Aortic valve regurgitation is not visualized. No aortic stenosis is present.  7. The inferior vena cava is dilated in size with <50% respiratory variability, suggesting right atrial pressure of 15 mmHg. Conclusion(s)/Recommendation(s): There is severe LVH and the LV cavity seems underfilled. There is moderate to severe RV dysfunction with flattening of the interventricular septum suggest or R-sided pressure volume overload. FINDINGS  Left Ventricle: Left ventricular ejection fraction, by estimation, is 55%. The left ventricle has severely decreased function. The left ventricle has no regional wall motion abnormalities. Definity contrast agent was given IV to delineate the left ventricular endocardial borders. The left ventricular internal cavity size was normal in size. There is severe concentric left ventricular hypertrophy. The interventricular septum is flattened in systole and diastole,  consistent with right ventricular pressure and volume overload. Left ventricular diastolic parameters are consistent with Grade II diastolic dysfunction (pseudonormalization). Right Ventricle: The right ventricular size is normal. No increase in right ventricular wall thickness. Right ventricular systolic function is severely reduced. Tricuspid regurgitation signal is inadequate for assessing PA pressure. Left Atrium: Left atrial size was normal in size. Right Atrium: Right atrial size was mildly dilated. Pericardium: A small pericardial effusion is present. The pericardial effusion is surrounding the apex. Mitral Valve: The mitral valve is normal in structure. Trivial mitral valve regurgitation. No evidence of mitral valve stenosis. Tricuspid Valve: The tricuspid valve is normal in structure. Tricuspid valve regurgitation is trivial. No evidence of tricuspid stenosis. Aortic Valve: The aortic valve is tricuspid. There is mild calcification of the aortic valve. Aortic valve regurgitation is not visualized. No aortic stenosis is present. Pulmonic Valve: The pulmonic valve was normal in structure. Pulmonic valve regurgitation is  not visualized. No evidence of pulmonic stenosis. Aorta: The aortic root is normal in size and structure. Venous: The inferior vena cava is dilated in size with less than 50% respiratory variability, suggesting right atrial pressure of 15 mmHg. IAS/Shunts: No atrial level shunt detected by color flow Doppler.  LEFT VENTRICLE PLAX 2D LVIDd:         3.40 cm     Diastology LVIDs:         2.00 cm     LV e' medial:    3.59 cm/s LV PW:         1.80 cm     LV E/e' medial:  19.1 LV IVS:        1.90 cm     LV e' lateral:   5.66 cm/s LVOT diam:     2.30 cm     LV E/e' lateral: 12.1 LV SV:         41 LV SV Index:   20 LVOT Area:     4.15 cm  LV Volumes (MOD) LV vol d, MOD A2C: 71.1 ml LV vol d, MOD A4C: 78.0 ml LV vol s, MOD A2C: 43.1 ml LV vol s, MOD A4C: 48.0 ml LV SV MOD A2C:     28.0 ml LV SV MOD  A4C:     78.0 ml LV SV MOD BP:      32.6 ml RIGHT VENTRICLE            IVC RV S prime:     7.07 cm/s  IVC diam: 2.10 cm TAPSE (M-mode): 0.8 cm LEFT ATRIUM             Index        RIGHT ATRIUM           Index LA diam:        3.40 cm 1.66 cm/m   RA Area:     16.00 cm LA Vol (A2C):   59.2 ml 28.98 ml/m  RA Volume:   44.50 ml  21.78 ml/m LA Vol (A4C):   23.5 ml 11.50 ml/m LA Biplane Vol: 40.0 ml 19.58 ml/m  AORTIC VALVE LVOT Vmax:   76.70 cm/s LVOT Vmean:  44.400 cm/s LVOT VTI:    0.098 m  AORTA Ao Root diam: 3.00 cm Ao Asc diam:  3.00 cm MITRAL VALVE MV Area (PHT): 3.53 cm    SHUNTS MV Decel Time: 215 msec    Systemic VTI:  0.10 m MV E velocity: 68.50 cm/s  Systemic Diam: 2.30 cm MV A velocity: 41.50 cm/s MV E/A ratio:  1.65 Arvilla Meres MD Electronically signed by Arvilla Meres MD Signature Date/Time: 06/27/2023/9:52:54 AM    Final    Medications:    Scheduled Medications:  aspirin  81 mg Per Tube Daily   atorvastatin  80 mg Per Tube Daily   Chlorhexidine Gluconate Cloth  6 each Topical Daily   clopidogrel  75 mg Per Tube Daily   ezetimibe  10 mg Per Tube Daily   insulin aspart  0-9 Units Subcutaneous Q4H   mouth rinse  15 mL Mouth Rinse Q2H   pantoprazole (PROTONIX) IV  40 mg Intravenous QHS   sodium chloride flush  10-40 mL Intracatheter Q12H   Infusions:  amiodarone 30 mg/hr (06/28/23 0528)   ampicillin-sulbactam (UNASYN) IV     dexmedetomidine (PRECEDEX) IV infusion 0.6 mcg/kg/hr (06/28/23 0528)   doxycycline (VIBRAMYCIN) IV Stopped (06/27/23 2329)   epinephrine Stopped (06/27/23 0826)   fentaNYL infusion INTRAVENOUS 125  mcg/hr (06/28/23 4540)   heparin 1,450 Units/hr (06/28/23 0528)   milrinone 0.25 mcg/kg/min (06/28/23 0528)   norepinephrine (LEVOPHED) Adult infusion 8 mcg/min (06/28/23 0528)   vasopressin 0.03 Units/min (06/28/23 0845)    PRN Medications: docusate, fentaNYL, influenza vaccine adjuvanted, midazolam, mouth rinse, pneumococcal 20-valent conjugate vaccine,  polyethylene glycol, sodium chloride flush  Patient Profile   Billy [name] is a 73 yo male with PMH of HTN, HLD, prior DVT, CVA, paroxysmal atrial fibrillation, CKD stage IIIb, carotid artery disease s/p endarterectomy, and chronic diastolic CHF.   Assessment/Plan   Cardiogenic shock Biventricular Heart Failure NSTEMI - Lactic acid 6.5 (on admission)>3.3 with high pressor requirement.  - Prev nuclear stress test 3/24: prior infarct with peri-infarct ischemia. - Echo today with EF 45-50% with severe LVH (on Dr. Kathlyn Sacramento read), severely reduced LV and RV function, G2DD, small pericardial effusion - previously mentioned concern for cardiac amyloidosis in 2019 (per EHR), never worked up. Sending multiple myeloma panel, immunofixation, kappa lambda light chain, SPEP, IFE. - will need cMRI to r/o amyloidosis after extubation - unable to perform LHC with renal function - Co-ox 66. Continue Milrinone 0.25 mcg/kg/min to assist with RV and renal function - CVP 5, however up 2.5L and generalized edema. Give Lasix 80 mg IV bid today - hypertensive, wean NE + VP for BP as able - continue ASA 81 mg, plavix 75 mg, atorva 80 mg , zetia 10 mg daily   Witnessed OOH Arrest Acute Hypoxic Encephalopathy Concern for PE - witnessed arrest with bystander CPR + EMS CPR and field intubation with ROSC ~62min downtime - second PEA arrest in ED requiring 1 round CPR and Epi - CT head negative for acute findings. Following commands - unknown cause, concern for PE - concern for CAP with elevated WBC & Tmax 100.2. Afebrile today. Unasyn per CCM.  - + for bilateral DVTs (chronic R pop vein, acute L profunda and proxFem vein, L pop and mid to distal fem vein)  - Unable to get CTPE with renal function. V/Q scan once extubated. - continue heparin gtt - Wean/extubate per CCM   Paroxysmal Atrial Fibrillation - s/p loop recorder insertion in 2019 - frequent PACs/PVCs on tele - continue amio gtt while on milrinone -  continue heparin gtt   AKI on CKD 3b - Cr ~2-2.2 - now 2.4>2.8>2.7 - milrinone + diuresis as able   Carotid Stenosis H/o CVA in 2019 - s/p R endarterectomy in 6/24   Length of Stay: 2  CRITICAL CARE Performed by: [place] Lee  Total critical care time: 15 minutes  Critical care time was exclusive of separately billable procedures and treating other patients.  Critical care was necessary to treat or prevent imminent or life-threatening deterioration.  Critical care was time spent personally by me on the following activities: development of treatment plan with patient and/or surrogate as well as nursing, discussions with consultants, evaluation of patient's response to treatment, examination of patient, obtaining history from patient or surrogate, ordering and performing treatments and interventions, ordering and review of laboratory studies, ordering and review of radiographic studies, pulse oximetry and re-evaluation of patient's condition.  [place] Lee, NP  06/28/2023, 8:55 AM  Advanced Heart Failure Team Pager [phone] (M-F; 7a - 5p)  Please contact [facility] for night-coverage after hours (5p -7a ) and weekends on [email or web address]  Patient seen with NP.  I formulated the plan and agree with the above note.   He is following commands this morning.  He is on milrinone  0.25, NE 8, vasopressin 0.03.  MAP is elevated today.  Co-ox 65%, CVP about 6.   PCT 8.37, on Unasyn for possible aspiration PNA.   General: Awake on vent Neck: No JVD, no thyromegaly or thyroid nodule.  Lungs: Clear to auscultation bilaterally with normal respiratory effort. CV: Nondisplaced PMI.  Heart regular S1/S2, no S3/S4, no murmur.  1+ edema to knees.   Abdomen: Soft, nontender, no hepatosplenomegaly, no distention.  Skin: Intact without lesions or rashes.  Neurologic: Can follow commands Extremities: No clubbing or cyanosis.  HEENT: Normal.   1. Cardiac arrest: Witnessed arrest with bystander then EMS  CPR.  >20 minutes CPR.  He was shocked for VF.  Had additional 4-5 minutes PEA arrest in ER. ?PE with aspiration given bilateral DVTs.  - Runs of PVCs, h/o AF => continue amiodarone gtt for now.  - Heparin gtt.  2. Cardiogenic shock: Echo showed EF 45%, diffuse hypokinesis, severe LVH, normal RV size with moderately decreased systolic function, small pericardial effusion, dilated IVC.  Suspicion for PE as above.  PNA on CXR, could also have septic component.  He is on NE 8, vasopressin 0.03, milrinone 0.25.  Co-ox 65%.  - Wean pressors today, MAP is elevated.  - Continue milrinone for now for RV support.  3. Acute HF with mid range EF: With prominent RV failure. Echo showed EF 45%, diffuse hypokinesis, severe LVH, normal RV size with moderately decreased systolic function, small pericardial effusion, dilated IVC. As above, worry for PE with RV failure. Baseline cardiomyopathy with severe LVH raises concern for cardiac amyloidosis.  Co-ox 65%, CVP 6 today.  - CVP not elevated but I/Os positive and he is swollen on exam, will give Lasix 80 mg IV x 1 and follow response.  - Continue milrinone 0.25 as above for now for RV support.  - He should have workup for cardiac amyloidosis.  4. Venous thromboembolism: Bilateral DVTs, mixed acute and chronic.  He is not anticoagulated at home.  With RV failure on echo, strong suspicion for PE.  - Heparin gtt.  - Eventual V/Q scan. No CTA with AKI.  5. CAD: NSTEMI with HS-TnI 3045.  ?Demand ischemia from arrest vs ACS.  He has history of carotid disease. - ASA/Plavix to continue (on at home).  - Atorvastatin 80 daily.  - Avoid coronary angiography with no STEMI and AKI.  6. Carotid stenosis: h/o CVA.  Endarterectomy in 2024.  7. Atrial fibrillation: Paroxysmal.  Currently in NSR.  Was not anticoagulated at home.  - Heparin gtt.  - Amiodarone gtt as above.  8. AKI on CKD 3b: Suspect ATN in setting of hypotension/arrest.  Creatinine fairly stable today at 2.7.   9. ID: PCT [id number], suspect PNA.   - Covering with Unasyn for possible aspiration PNA.   CRITICAL CARE Performed by: Marca Ancona  Total critical care time: 40 minutes  Critical care time was exclusive of separately billable procedures and treating other patients.  Critical care was necessary to treat or prevent imminent or life-threatening deterioration.  Critical care was time spent personally by me on the following activities: development of treatment plan with patient and/or surrogate as well as nursing, discussions with consultants, evaluation of patient's response to treatment, examination of patient, obtaining history from patient or surrogate, ordering and performing treatments and interventions, ordering and review of laboratory studies, ordering and review of radiographic studies, pulse oximetry and re-evaluation of patient's condition.   Marca Ancona 06/28/2023 9:44 AM
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
## 2023-06-29 ENCOUNTER — Inpatient Hospital Stay (HOSPITAL_COMMUNITY)

## 2023-06-29 DIAGNOSIS — I4891 Unspecified atrial fibrillation: Secondary | ICD-10-CM

## 2023-06-29 DIAGNOSIS — N179 Acute kidney failure, unspecified: Secondary | ICD-10-CM | POA: Diagnosis not present

## 2023-06-29 DIAGNOSIS — J9601 Acute respiratory failure with hypoxia: Secondary | ICD-10-CM | POA: Diagnosis not present

## 2023-06-29 DIAGNOSIS — I469 Cardiac arrest, cause unspecified: Secondary | ICD-10-CM | POA: Diagnosis not present

## 2023-06-29 LAB — BASIC METABOLIC PANEL
Anion gap: 11 (ref 5–15)
Anion gap: 14 (ref 5–15)
Anion gap: 13 (ref 5–15)
BUN: 57 mg/dL — ABNORMAL HIGH (ref 8–23)
BUN: 55 mg/dL — ABNORMAL HIGH (ref 8–23)
BUN: 55 mg/dL — ABNORMAL HIGH (ref 8–23)
CO2: 22 mmol/L (ref 22–32)
CO2: 20 mmol/L — ABNORMAL LOW (ref 22–32)
CO2: 21 mmol/L — ABNORMAL LOW (ref 22–32)
Calcium: 7.6 mg/dL — ABNORMAL LOW (ref 8.9–10.3)
Calcium: 7.3 mg/dL — ABNORMAL LOW (ref 8.9–10.3)
Calcium: 7.5 mg/dL — ABNORMAL LOW (ref 8.9–10.3)
Chloride: 109 mmol/L (ref 98–111)
Chloride: 106 mmol/L (ref 98–111)
Chloride: 106 mmol/L (ref 98–111)
Creatinine, Ser: 2.7 mg/dL — ABNORMAL HIGH (ref 0.61–1.24)
Creatinine, Ser: 2.69 mg/dL — ABNORMAL HIGH (ref 0.61–1.24)
Creatinine, Ser: 2.62 mg/dL — ABNORMAL HIGH (ref 0.61–1.24)
GFR, Estimated: 25 mL/min — ABNORMAL LOW (ref >60)
GFR, Estimated: 25 mL/min — ABNORMAL LOW (ref >60)
GFR, Estimated: 25 mL/min — ABNORMAL LOW (ref >60)
Glucose, Bld: 123 mg/dL — ABNORMAL HIGH (ref 70–99)
Glucose, Bld: 136 mg/dL — ABNORMAL HIGH (ref 70–99)
Glucose, Bld: 124 mg/dL — ABNORMAL HIGH (ref 70–99)
Potassium: 3 mmol/L — ABNORMAL LOW (ref 3.5–5.1)
Potassium: 2.9 mmol/L — ABNORMAL LOW (ref 3.5–5.1)
Potassium: 3.1 mmol/L — ABNORMAL LOW (ref 3.5–5.1)
Sodium: 142 mmol/L (ref 135–145)
Sodium: 140 mmol/L (ref 135–145)
Sodium: 140 mmol/L (ref 135–145)

## 2023-06-29 LAB — CBC
HCT: 27.2 % — ABNORMAL LOW (ref 39.0–52.0)
HCT: 27.6 % — ABNORMAL LOW (ref 39.0–52.0)
Hemoglobin: 8.7 g/dL — ABNORMAL LOW (ref 13.0–17.0)
Hemoglobin: 8.8 g/dL — ABNORMAL LOW (ref 13.0–17.0)
MCH: 24.8 pg — ABNORMAL LOW (ref 26.0–34.0)
MCH: 24.6 pg — ABNORMAL LOW (ref 26.0–34.0)
MCHC: 32 g/dL (ref 30.0–36.0)
MCHC: 31.9 g/dL (ref 30.0–36.0)
MCV: 77.5 fL — ABNORMAL LOW (ref 80.0–100.0)
MCV: 77.1 fL — ABNORMAL LOW (ref 80.0–100.0)
Platelets: 233 10*3/uL (ref 150–400)
Platelets: 250 10*3/uL (ref 150–400)
RBC: 3.51 MIL/uL — ABNORMAL LOW (ref 4.22–5.81)
RBC: 3.58 MIL/uL — ABNORMAL LOW (ref 4.22–5.81)
RDW: 16.5 % — ABNORMAL HIGH (ref 11.5–15.5)
RDW: 16.5 % — ABNORMAL HIGH (ref 11.5–15.5)
WBC: 15.4 10*3/uL — ABNORMAL HIGH (ref 4.0–10.5)
WBC: 19.7 10*3/uL — ABNORMAL HIGH (ref 4.0–10.5)
nRBC: 0.2 % (ref 0.0–0.2)
nRBC: 0.3 % — ABNORMAL HIGH (ref 0.0–0.2)

## 2023-06-29 LAB — COOXEMETRY PANEL
Carboxyhemoglobin: 1.4 % (ref 0.5–1.5)
Methemoglobin: 0.7 % (ref 0.0–1.5)
O2 Saturation: 61.7 %
Total hemoglobin: 8.3 g/dL — ABNORMAL LOW (ref 12.0–16.0)

## 2023-06-29 LAB — CULTURE, RESPIRATORY W GRAM STAIN: Culture: NORMAL

## 2023-06-29 LAB — PHOSPHORUS: Phosphorus: 3.6 mg/dL (ref 2.5–4.6)

## 2023-06-29 LAB — GLUCOSE, CAPILLARY
Glucose-Capillary: 51 mg/dL — ABNORMAL LOW (ref 70–99)
Glucose-Capillary: 167 mg/dL — ABNORMAL HIGH (ref 70–99)
Glucose-Capillary: 133 mg/dL — ABNORMAL HIGH (ref 70–99)
Glucose-Capillary: 102 mg/dL — ABNORMAL HIGH (ref 70–99)
Glucose-Capillary: 106 mg/dL — ABNORMAL HIGH (ref 70–99)
Glucose-Capillary: 127 mg/dL — ABNORMAL HIGH (ref 70–99)

## 2023-06-29 LAB — HEPARIN LEVEL (UNFRACTIONATED)
Heparin Unfractionated: 0.4 [IU]/mL (ref 0.30–0.70)
Heparin Unfractionated: 0.37 [IU]/mL (ref 0.30–0.70)

## 2023-06-29 LAB — LACTIC ACID, PLASMA: Lactic Acid, Venous: 1.7 mmol/L (ref 0.5–1.9)

## 2023-06-29 LAB — MAGNESIUM: Magnesium: 1.9 mg/dL (ref 1.7–2.4)

## 2023-06-29 LAB — ECHO TEE: Est EF: 55

## 2023-06-29 MED ORDER — POTASSIUM CHLORIDE 10 MEQ/50ML IV SOLN
10.0000 meq | INTRAVENOUS | Status: AC
  Administered 2023-06-29 (×3): 10 meq via INTRAVENOUS
  Filled 2023-06-29 (×3): qty 50

## 2023-06-29 MED ORDER — SILDENAFIL CITRATE 20 MG PO TABS
20.0000 mg | ORAL_TABLET | Freq: Three times a day (TID) | ORAL | Status: DC
  Administered 2023-06-29: 20 mg via ORAL
  Filled 2023-06-29: qty 1

## 2023-06-29 MED ORDER — ALPRAZOLAM 0.25 MG PO TABS: 0.2500 mg | ORAL_TABLET | Freq: Once | ORAL | Status: DC

## 2023-06-29 MED ORDER — LABETALOL HCL 5 MG/ML IV SOLN
10.0000 mg | INTRAVENOUS | Status: DC | PRN
  Filled 2023-06-29: qty 4

## 2023-06-29 MED ORDER — FUROSEMIDE 10 MG/ML IJ SOLN
80.0000 mg | Freq: Once | INTRAMUSCULAR | Status: AC
Start: 2023-06-29 — End: 2023-06-29
  Administered 2023-06-29: 80 mg via INTRAVENOUS
  Filled 2023-06-29: qty 8

## 2023-06-29 MED ORDER — AMIODARONE LOAD VIA INFUSION
150.0000 mg | Freq: Once | INTRAVENOUS | Status: AC
  Administered 2023-06-29: 150 mg via INTRAVENOUS
  Filled 2023-06-29: qty 83.34

## 2023-06-29 MED ORDER — POTASSIUM CHLORIDE 20 MEQ PO PACK
40.0000 meq | PACK | ORAL | Status: DC
Start: 2023-06-29 — End: 2023-06-29

## 2023-06-29 MED ORDER — POTASSIUM CHLORIDE 10 MEQ/50ML IV SOLN
10.0000 meq | INTRAVENOUS | Status: AC
  Administered 2023-06-29 (×6): 10 meq via INTRAVENOUS
  Filled 2023-06-29 (×6): qty 50

## 2023-06-29 MED ORDER — ACETAMINOPHEN 10 MG/ML IV SOLN
1000.0000 mg | Freq: Four times a day (QID) | INTRAVENOUS | Status: AC | PRN
  Administered 2023-06-29 – 2023-06-30 (×2): 1000 mg via INTRAVENOUS
  Filled 2023-06-29 (×2): qty 100

## 2023-06-29 MED ORDER — DEXTROSE 50 % IV SOLN
INTRAVENOUS | Status: AC
  Filled 2023-06-29: qty 50

## 2023-06-29 MED ORDER — LABETALOL HCL 5 MG/ML IV SOLN
40.0000 mg | Freq: Once | INTRAVENOUS | Status: AC
  Administered 2023-06-30: 40 mg via INTRAVENOUS
  Filled 2023-06-29: qty 8

## 2023-06-29 MED ORDER — ONDANSETRON HCL 4 MG/2ML IJ SOLN
4.0000 mg | Freq: Three times a day (TID) | INTRAMUSCULAR | Status: DC | PRN
  Administered 2023-06-30: 4 mg via INTRAVENOUS
  Filled 2023-06-29 (×2): qty 2

## 2023-06-29 MED ORDER — AMIODARONE IV BOLUS ONLY 150 MG/100ML: 150.0000 mg | Freq: Once | INTRAVENOUS | Status: DC

## 2023-06-29 MED ORDER — MAGNESIUM SULFATE 2 GM/50ML IV SOLN
2.0000 g | Freq: Once | INTRAVENOUS | Status: AC
  Administered 2023-06-29: 2 g via INTRAVENOUS
  Filled 2023-06-29: qty 50

## 2023-06-29 MED ORDER — PROPOFOL 10 MG/ML IV BOLUS
100.0000 mg | Freq: Once | INTRAVENOUS | Status: AC
  Administered 2023-06-29: 50 mg via INTRAVENOUS
  Filled 2023-06-29: qty 20

## 2023-06-29 MED ORDER — FUROSEMIDE 10 MG/ML IJ SOLN: 80.0000 mg | Freq: Once | INTRAMUSCULAR | Status: DC

## 2023-06-29 MED ORDER — FENTANYL CITRATE PF 50 MCG/ML IJ SOSY
100.0000 ug | PREFILLED_SYRINGE | Freq: Once | INTRAMUSCULAR | Status: AC
  Administered 2023-06-29: 50 ug via INTRAVENOUS
  Filled 2023-06-29: qty 2

## 2023-06-29 MED ORDER — DEXTROSE 50 % IV SOLN
25.0000 g | INTRAVENOUS | Status: AC
Start: 2023-06-29 — End: 2023-06-29
  Administered 2023-06-29: 25 g via INTRAVENOUS

## 2023-06-29 MED ORDER — LORAZEPAM 2 MG/ML IJ SOLN
0.5000 mg | Freq: Once | INTRAMUSCULAR | Status: AC
  Administered 2023-06-29: 0.5 mg via INTRAVENOUS
  Filled 2023-06-29: qty 1

## 2023-06-29 MED ORDER — ENSURE ENLIVE PO LIQD
237.0000 mL | Freq: Three times a day (TID) | ORAL | Status: DC
  Administered 2023-07-03 – 2023-07-06 (×8): 237 mL via ORAL

## 2023-06-29 NOTE — Progress Notes (Signed)
 Afternoon round: cardioverted today with Shirlee Latch back to NSR. Still on amio. Pressors off. Had episode of bloody BM. Repeated CBC and type and screen sent for now. Will monitor.
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
## 2023-06-29 NOTE — Procedures (Signed)
 Electrical Cardioversion Procedure Note Billy [name] [id number] 29-Aug-1950  Procedure: Electrical Cardioversion Indications:  Atrial Fibrillation  Procedure Details Consent: Risks of procedure as well as the alternatives and risks of each were explained to the (patient/caregiver).  Consent for procedure obtained. Time Out: Verified patient identification, verified procedure, site/side was marked, verified correct patient position, special equipment/implants available, medications/allergies/relevent history reviewed, required imaging and test results available.  Performed  Patient placed on cardiac monitor, pulse oximetry, supplemental oxygen as necessary.  Sedation given:  Propofol per CCM Pacer pads placed anterior and posterior chest.  Cardioverted 1 time(s).  Cardioverted at 200J.  Evaluation Findings: Post procedure EKG shows: NSR Complications: None Patient did tolerate procedure well.   Billy [name] 06/29/2023, 12:22 PM

## 2023-06-29 NOTE — Progress Notes (Signed)
 Hypoglycemic Event  CBG: 51  Treatment: D50 50 mL (25 gm)  Symptoms: None  Follow-up CBG: Time:0450 CBG Result:167  Possible Reasons for Event: Inadequate meal intake   Billy [name]

## 2023-06-29 NOTE — Progress Notes (Signed)
 PHARMACY - ANTICOAGULATION CONSULT NOTE  Pharmacy Consult for heparin Indication:  NSTEMI, +DVT, r/o PE, h/o Afib  Labs: Recent Labs    06/26/23 2325 06/27/23 0315 06/27/23 0333 06/27/23 0510 06/27/23 1116 06/27/23 1250 06/27/23 2336 06/28/23 0350 06/28/23 0913 06/28/23 1958 06/29/23 0408  HGB  --    < > 10.5* 10.9*  --   --   --  9.8*  --   --  8.7*  HCT  --    < > 33.3* 32.0*  --   --   --  30.4*  --   --  27.2*  PLT  --   --  383  --   --   --   --  238  --   --  233  HEPARINUNFRC  --   --   --   --   --   --    < > 0.21* 0.15* 0.14* 0.40  CREATININE  --   --  2.41*  --  2.80*  --   --  2.70*  --   --   --   CKTOTAL  --   --  1,439*  --   --   --   --   --   --   --   --   TROPONINIHS 458*  --   --   --  3,045* 2,839*  --   --   --   --   --    < > = values in this interval not displayed.   Assessment/Plan:  73yo male therapeutic on heparin after rate change. Will continue infusion at current rate of 2050 units/hr and confirm stable with additional level.  Vernard Gambles, PharmD, BCPS 06/29/2023 4:45 AM

## 2023-06-29 NOTE — CV Procedure (Signed)
 Procedure:  TEE  Sedation: Per CCM  Indication: Atrial fibrillation  Findings: Please see echo section for full report.  Normal size with severe LV hypertrophy, EF 55%.  Normal size with mild systolic dysfunction.  Trivial mitral regurgitation.  Mild left atrial enlargement, no LA appendage thrombus.  Normal RA.  No ASD/PFO.  Trileaflet aortic valve, no stenosis or regurgitation.  Trivial TR.  Normal caliber thoracic aorta with minimal plaque.   No LA appendage thrombus, ok for DCCV.   Marca Ancona 06/29/2023 12:21 PM

## 2023-06-29 NOTE — Progress Notes (Addendum)
 Advanced Heart Failure Rounding Note  Cardiologist: Nanetta Batty, MD  Chief Complaint: RV Failure Subjective:    Co-ox 61. CVP 5.  On Milrinone 0.25. HTN off pressors. Pulse pressure in 110s. Remains in AF RVR in 120s. On amio gtt. Received 1 amio bolus overnight.   Significant shortness of breath and diaphoretic. Severe orthopnea. Mental status improved, however delayed.   Objective:    Weight Range: 84.1 kg Body mass index is 24.46 kg/m.   Vital Signs: Temp:  [97 F (36.1 C)-99.9 F (37.7 C)] 98.6 F (37 C) (03/13 0800) Pulse Rate:  [52-142] 85 (03/13 0800) Resp:  [10-31] 14 (03/13 0800) BP: (97-176)/(43-141) 166/56 (03/13 0800) SpO2:  [85 %-100 %] 96 % (03/13 0800) Arterial Line BP: (71-237)/(34-107) 229/56 (03/13 0800) FiO2 (%):  [40 %] 40 % (03/12 1106) Weight:  [84.1 kg] 84.1 kg (03/13 0500) Last BM Date : 06/28/23  Weight change: Filed Weights   06/27/23 1300 06/28/23 0400 06/29/23 0500  Weight: 80.3 kg 80.3 kg 84.1 kg   Intake/Output:  Intake/Output Summary (Last 24 hours) at 06/29/2023 0918 Last data filed at 06/29/2023 0800 Gross per 24 hour  Intake 2743.67 ml  Output 2340 ml  Net 403.67 ml    Physical Exam    CVP 4-5 General: Elderly, diaphoretic   Cardiac: JVP ~12cm. S1 and S2 present. No murmurs or rub. Abdomen: Soft, non-tender, non-distended. + BS. Extremities: Warm and wet. No rash, cyanosis.  1+ peripheral edema.  Neuro: Alert and oriented x3. Questions delayed.   Telemetry   SR in 80-90 with frequent PACs (personally reviewed)  EKG    N/A  Labs    CBC Recent Labs    06/26/23 2039 06/27/23 0315 06/28/23 0350 06/29/23 0408  WBC 18.1*   < > 12.8* 15.4*  NEUTROABS 11.0*  --   --   --   HGB 11.2*   < > 9.8* 8.7*  HCT 38.9*   < > 30.4* 27.2*  MCV 87.8   < > 77.6* 77.5*  PLT 341   < > 238 233   < > = values in this interval not displayed.   Basic Metabolic Panel Recent Labs    72/53/66 0350 06/29/23 0408  NA 143 142   K 3.5 3.0*  CL 108 109  CO2 20* 22  GLUCOSE 150* 123*  BUN 55* 57*  CREATININE 2.70* 2.70*  CALCIUM 7.7* 7.6*  MG 1.7 1.9  PHOS 3.3 3.6   Liver Function Tests Recent Labs    06/27/23 0333  AST 78*  ALT 57*  ALKPHOS 63  BILITOT 0.7  PROT 4.5*  ALBUMIN 1.7*   No results for input(s): "LIPASE", "AMYLASE" in the last 72 hours. Cardiac Enzymes Recent Labs    06/27/23 0333  CKTOTAL 1,439*   BNP: BNP (last 3 results) Recent Labs    03/09/23 1544 03/11/23 0218 06/27/23 1116  BNP 660.9* 491.3* 646.4*    Imaging   DG CHEST PORT 1 VIEW Result Date: 06/29/2023 CLINICAL DATA:  Congestive heart failure EXAM: PORTABLE CHEST 1 VIEW COMPARISON:  06/28/2023 FINDINGS: Hazy opacification of the bilateral chest with small, increased pleural effusions. Interval tracheal and esophageal extubation. Normal heart size. Left subclavian line with tip at the upper cavoatrial junction. Implantable loop recorder over the left chest. IMPRESSION: Extubation with lower lung volumes and increased edema/opacification. Small pleural effusions are also progressed. Electronically Signed   By: Tiburcio Pea M.D.   On: 06/29/2023 08:42   DG CHEST PORT  1 VIEW Result Date: 06/28/2023 CLINICAL DATA:  Pneumonia EXAM: PORTABLE CHEST 1 VIEW COMPARISON:  06/26/2023 FINDINGS: Cardiac shadow is stable. Left subclavian central line, endotracheal tube and gastric catheter are noted. The gastric catheter shows the proximal side port at the GE junction. This could be advanced deeper into the stomach. Persistent airspace opacity is noted bilaterally but improved from the prior study. No new focal abnormality is noted. IMPRESSION: Improved aeration when compared with the prior exam. Electronically Signed   By: Alcide Clever M.D.   On: 06/28/2023 11:09   Medications:    Scheduled Medications:  amiodarone  150 mg Intravenous Once   aspirin  81 mg Per Tube Daily   atorvastatin  80 mg Per Tube Daily   Chlorhexidine  Gluconate Cloth  6 each Topical Daily   clopidogrel  75 mg Per Tube Daily   ezetimibe  10 mg Per Tube Daily   insulin aspart  0-9 Units Subcutaneous Q4H   pantoprazole (PROTONIX) IV  40 mg Intravenous QHS   sodium chloride flush  10-40 mL Intracatheter Q12H   Infusions:  acetaminophen Stopped (06/29/23 0521)   amiodarone 60 mg/hr (06/29/23 0800)   cefTRIAXone (ROCEPHIN)  IV     dexmedetomidine (PRECEDEX) IV infusion Stopped (06/28/23 0945)   doxycycline (VIBRAMYCIN) IV Stopped (06/28/23 2304)   heparin 2,050 Units/hr (06/29/23 0800)   milrinone 0.25 mcg/kg/min (06/29/23 0800)   norepinephrine (LEVOPHED) Adult infusion Stopped (06/29/23 0407)   potassium chloride 10 mEq (06/29/23 0814)   vasopressin Stopped (06/29/23 0135)    PRN Medications: acetaminophen, docusate, influenza vaccine adjuvanted, mouth rinse, pneumococcal 20-valent conjugate vaccine, polyethylene glycol, sodium chloride flush  Patient Profile   Billy [name] is a 73 yo male with PMH of HTN, HLD, prior DVT, CVA, paroxysmal atrial fibrillation, CKD stage IIIb, carotid artery disease s/p endarterectomy, and chronic diastolic CHF.   Assessment/Plan   Cardiogenic shock RV Failure - Lactic acid 6.5 (on admission)>3.3 with high pressor requirement.  - Echo today with EF 45-50% with severe LVH (on Dr. Kathlyn Sacramento read), severely reduced RV function, G2DD, small pericardial effusion - previously mentioned concern for cardiac amyloidosis in 2019 (per EHR), never worked up. Sending multiple myeloma panel, immunofixation, SPEP, IFE pending. K/L ratio normal, K and L elevated in setting of AKI - will need cMRI to r/o amyloidosis, unable to lay flat at this time - concerned that RV failure is exacerbated by AF RVR - Co-ox 62. Continue Milrinone 0.25 mcg/kg/min - CVP 5, generalized edema, with wide PP concern for significant RV failure. Give Lasix 80 mg IV bid today. - hypertensive, NE and VP off.  - repeat lactic acid - start  sildenafil 20 mg tid - Will d/w Dr. Shirlee Latch about need for TEE/CV +/- RHC   Witnessed OOH Arrest Acute Hypoxic Encephalopathy Possible PE - witnessed arrest with bystander CPR + EMS CPR and field intubation with ROSC ~80min downtime - second PEA arrest in ED requiring 1 round CPR and Epi - CT head negative for acute findings. Following commands - unknown cause, concern for PE - concern for CAP with elevated WBC & Tmax 99.9. Unasyn + Doxy per CCM.  - + for bilateral DVTs (chronic R pop vein, acute L profunda and proxFem vein, L pop and mid to distal fem vein)  - Unable to get CTPE with renal function. V/Q scan once extubated. - continue heparin gtt - diurese as able   Paroxysmal Atrial Fibrillation - s/p loop recorder insertion in 2019 - AF RVR  yesterday afternoon, does not appear to tolerate well - continue amio gtt at 60/hr. Give bolus now.  - continue heparin gtt - will need TEE/CV if does not chemically convert  CAD NSTEMI - Prev nuclear stress test 3/24: prior infarct with peri-infarct ischemia. - unable to perform LHC with renal function.  - on heparin - continue ASA 81 mg, plavix 75 mg, atorva 80 mg , zetia 10 mg daily   AKI on CKD 3b - Cr ~2-2.2 - now 2.4>2.8>2.7>2.7. Making good UOP.  - milrinone + diuresis as able   Carotid Stenosis H/o CVA in 2019 - s/p R endarterectomy in 6/24   Length of Stay: 3  CRITICAL CARE Performed by: [place] Lee  Total critical care time: 17 minutes  Critical care time was exclusive of separately billable procedures and treating other patients.  Critical care was necessary to treat or prevent imminent or life-threatening deterioration.  Critical care was time spent personally by me on the following activities: development of treatment plan with patient and/or surrogate as well as nursing, discussions with consultants, evaluation of patient's response to treatment, examination of patient, obtaining history from patient or surrogate,  ordering and performing treatments and interventions, ordering and review of laboratory studies, ordering and review of radiographic studies, pulse oximetry and re-evaluation of patient's condition.  [place] Lee, NP  06/29/2023, 9:18 AM  Advanced Heart Failure Team Pager [phone] (M-F; 7a - 5p)  Please contact [facility] for night-coverage after hours (5p -7a ) and weekends on [email or web address]  Patient seen with NP, I formulated the plan and agree with the above note.   Extubated yesterday.    He went into atrial fibrillation with RVR yesterday pm and has been in RVR since.  He is on heparin gtt, amiodarone gtt up to 60 mg/hr.    He is diaphoretic this morning and uncomfortable-appearing.  Reports shortness of breath.  Now hypertensive and off pressors, remains on milrinone 0.25 mcg/kg/min.  Co-ox 61%.  CVP is still 5 but he is short of breath.  Creatinine unchanged at 2.7.   Given markedly worsening symptoms in atrial fibrillation, we bolused with IV amiodarone to try to get him back into NSR.  This did not succeed.  Therefore, we proceeded with TEE-guided DCCV.  Patient tolerated this well and is back in NSR now.    TEE showed EF 55% with severe LVH, normal RV size with mild RV dysfunction, IVC normal.   General: Diaphoretic, uncomfortable.  Neck: JVP not elevated, no thyromegaly or thyroid nodule.  Lungs: Clear to auscultation bilaterally with normal respiratory effort. CV: Nondisplaced PMI.  Heart regular S1/S2, no S3/S4, no murmur.  No peripheral edema. .  Abdomen: Soft, nontender, no hepatosplenomegaly, no distention.  Skin: Intact without lesions or rashes.  Neurologic: Alert and oriented x 3.  Psych: Normal affect. Extremities: No clubbing or cyanosis.  HEENT: Normal.   1. Cardiac arrest: Witnessed arrest with bystander then EMS CPR.  >20 minutes CPR.  He was shocked for VF.  Had additional 4-5 minutes PEA arrest in ER. ?PE with aspiration given bilateral DVTs.  - On amiodarone  gtt with atrial fibrillation.  - Heparin gtt.  2. Cardiogenic shock: Initial echo showed EF 45%, diffuse hypokinesis, severe LVH, normal RV size with moderately decreased systolic function, small pericardial effusion, dilated IVC.  Suspicion for PE as above.  PNA on CXR, initial hypotension thought to be due to septic shock.  He is now off pressors.  3. Acute HF with mid  range EF: With prominent RV failure. Initial echo showed EF 45%, diffuse hypokinesis, severe LVH, normal RV size with moderately decreased systolic function, small pericardial effusion, dilated IVC. As above, worry for PE with RV failure. Baseline cardiomyopathy with severe LVH raises concern for cardiac amyloidosis.  TEE today showed EF 55% with mild RV dysfunction, small IVC. CVP 5, Co-ox 61%.  - I am going to stop milrinone today.  - Hold Lasix with low CVP and non-dilated IVC.  - He should have workup for cardiac amyloidosis, cardiac MRI before discharge.  4. Venous thromboembolism: Bilateral DVTs, mixed acute and chronic.  He is not anticoagulated at home.  With RV failure on echo, strong suspicion for PE.  - Heparin gtt.  - Eventual V/Q scan. No CTA with AKI.  5. CAD: NSTEMI with HS-TnI 3045.  ?Demand ischemia from arrest vs ACS.  He has history of carotid disease. - Continue Plavix (on from home) but stop ASA with anticoagulation.   - Atorvastatin 80 daily.  - Avoid coronary angiography with no STEMI and AKI.  6. Carotid stenosis: h/o CVA.  Endarterectomy in 2024.  7. Atrial fibrillation: He was not on anticoagulation at home though has history of prior CVA.  He went into AF/RVR here, tolerated poorly.  Now back in NSR after TEE-guided DCCV.  Feels better.  - Heparin gtt, transition eventually to Eliquis.  - Amiodarone gtt  8. AKI on CKD 3b: Suspect ATN in setting of hypotension/arrest.  Creatinine fairly stable today at 2.7.  9. ID: PCT 8.37, suspect PNA.   - Covering with ceftriaxone/doxycycline.  10. Neuro: Somewhat  slowed post-arrest.   CRITICAL CARE Performed by: Marca Ancona  Total critical care time: 45 minutes  Critical care time was exclusive of separately billable procedures and treating other patients.  Critical care was necessary to treat or prevent imminent or life-threatening deterioration.  Critical care was time spent personally by me on the following activities: development of treatment plan with patient and/or surrogate as well as nursing, discussions with consultants, evaluation of patient's response to treatment, examination of patient, obtaining history from patient or surrogate, ordering and performing treatments and interventions, ordering and review of laboratory studies, ordering and review of radiographic studies, pulse oximetry and re-evaluation of patient's condition.  Marca Ancona 06/29/2023 12:18 PM
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
## 2023-06-29 NOTE — Progress Notes (Signed)
   Inpatient Rehab Admissions Coordinator :  Per therapy recommendations patient was screened for CIR candidacy by Ottie Glazier RN MSN. Patient is not yet at a level to tolerate the intensity required to pursue a CIR admit . The CIR admissions team will follow and monitor for progress and place a Rehab Consult order if felt to be appropriate. Please contact me with any questions.  Ottie Glazier RN MSN Admissions Coordinator [phone]
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
## 2023-06-29 NOTE — Interval H&P Note (Signed)
 History and Physical Interval Note:  06/29/2023 11:56 AM  Billy [name]  has presented today for surgery, with the diagnosis of STEMI.  The various methods of treatment have been discussed with the patient and family. After consideration of risks, benefits and other options for treatment, the patient has consented to  Procedure(s): Coronary/Graft Acute MI Revascularization (N/A) LEFT HEART CATH AND CORONARY ANGIOGRAPHY (N/A) as a surgical intervention.  The patient's history has been reviewed, patient examined, no change in status, stable for surgery.  I have reviewed the patient's chart and labs.  Questions were answered to the patient's satisfaction.     Zariel Capano [employer]

## 2023-06-30 DIAGNOSIS — I469 Cardiac arrest, cause unspecified: Secondary | ICD-10-CM | POA: Diagnosis not present

## 2023-06-30 DIAGNOSIS — R570 Cardiogenic shock: Secondary | ICD-10-CM

## 2023-06-30 DIAGNOSIS — I214 Non-ST elevation (NSTEMI) myocardial infarction: Secondary | ICD-10-CM | POA: Diagnosis not present

## 2023-06-30 DIAGNOSIS — I4891 Unspecified atrial fibrillation: Secondary | ICD-10-CM | POA: Diagnosis not present

## 2023-06-30 LAB — BASIC METABOLIC PANEL
Anion gap: 12 (ref 5–15)
Anion gap: 14 (ref 5–15)
BUN: 57 mg/dL — ABNORMAL HIGH (ref 8–23)
BUN: 53 mg/dL — ABNORMAL HIGH (ref 8–23)
CO2: 20 mmol/L — ABNORMAL LOW (ref 22–32)
CO2: 20 mmol/L — ABNORMAL LOW (ref 22–32)
Calcium: 7.6 mg/dL — ABNORMAL LOW (ref 8.9–10.3)
Calcium: 7.7 mg/dL — ABNORMAL LOW (ref 8.9–10.3)
Chloride: 110 mmol/L (ref 98–111)
Chloride: 110 mmol/L (ref 98–111)
Creatinine, Ser: 2.55 mg/dL — ABNORMAL HIGH (ref 0.61–1.24)
Creatinine, Ser: 2.38 mg/dL — ABNORMAL HIGH (ref 0.61–1.24)
GFR, Estimated: 26 mL/min — ABNORMAL LOW (ref >60)
GFR, Estimated: 29 mL/min — ABNORMAL LOW (ref >60)
Glucose, Bld: 122 mg/dL — ABNORMAL HIGH (ref 70–99)
Glucose, Bld: 103 mg/dL — ABNORMAL HIGH (ref 70–99)
Potassium: 3.1 mmol/L — ABNORMAL LOW (ref 3.5–5.1)
Potassium: 3.6 mmol/L (ref 3.5–5.1)
Sodium: 142 mmol/L (ref 135–145)
Sodium: 144 mmol/L (ref 135–145)

## 2023-06-30 LAB — PREPARE RBC (CROSSMATCH)

## 2023-06-30 LAB — CBC
HCT: 24.3 % — ABNORMAL LOW (ref 39.0–52.0)
HCT: 30.3 % — ABNORMAL LOW (ref 39.0–52.0)
Hemoglobin: 7.9 g/dL — ABNORMAL LOW (ref 13.0–17.0)
Hemoglobin: 9.7 g/dL — ABNORMAL LOW (ref 13.0–17.0)
MCH: 24.9 pg — ABNORMAL LOW (ref 26.0–34.0)
MCH: 25.1 pg — ABNORMAL LOW (ref 26.0–34.0)
MCHC: 32.5 g/dL (ref 30.0–36.0)
MCHC: 32 g/dL (ref 30.0–36.0)
MCV: 76.7 fL — ABNORMAL LOW (ref 80.0–100.0)
MCV: 78.3 fL — ABNORMAL LOW (ref 80.0–100.0)
Platelets: 215 10*3/uL (ref 150–400)
Platelets: 209 10*3/uL (ref 150–400)
RBC: 3.17 MIL/uL — ABNORMAL LOW (ref 4.22–5.81)
RBC: 3.87 MIL/uL — ABNORMAL LOW (ref 4.22–5.81)
RDW: 16.6 % — ABNORMAL HIGH (ref 11.5–15.5)
RDW: 16.8 % — ABNORMAL HIGH (ref 11.5–15.5)
WBC: 14.9 10*3/uL — ABNORMAL HIGH (ref 4.0–10.5)
WBC: 17.7 10*3/uL — ABNORMAL HIGH (ref 4.0–10.5)
nRBC: 0.3 % — ABNORMAL HIGH (ref 0.0–0.2)
nRBC: 0.8 % — ABNORMAL HIGH (ref 0.0–0.2)

## 2023-06-30 LAB — GLUCOSE, CAPILLARY
Glucose-Capillary: 100 mg/dL — ABNORMAL HIGH (ref 70–99)
Glucose-Capillary: 100 mg/dL — ABNORMAL HIGH (ref 70–99)
Glucose-Capillary: 103 mg/dL — ABNORMAL HIGH (ref 70–99)
Glucose-Capillary: 112 mg/dL — ABNORMAL HIGH (ref 70–99)
Glucose-Capillary: 121 mg/dL — ABNORMAL HIGH (ref 70–99)
Glucose-Capillary: 123 mg/dL — ABNORMAL HIGH (ref 70–99)
Glucose-Capillary: 92 mg/dL (ref 70–99)
Glucose-Capillary: 97 mg/dL (ref 70–99)

## 2023-06-30 LAB — MAGNESIUM: Magnesium: 2 mg/dL (ref 1.7–2.4)

## 2023-06-30 LAB — COOXEMETRY PANEL
Carboxyhemoglobin: 1.9 % — ABNORMAL HIGH (ref 0.5–1.5)
Methemoglobin: <0.7 % (ref 0.0–1.5)
O2 Saturation: 56.5 %
Total hemoglobin: 8 g/dL — ABNORMAL LOW (ref 12.0–16.0)

## 2023-06-30 LAB — HEPARIN LEVEL (UNFRACTIONATED): Heparin Unfractionated: 0.32 [IU]/mL (ref 0.30–0.70)

## 2023-06-30 LAB — PHOSPHORUS: Phosphorus: 3 mg/dL (ref 2.5–4.6)

## 2023-06-30 MED ORDER — PANTOPRAZOLE SODIUM 40 MG IV SOLR
40.0000 mg | Freq: Two times a day (BID) | INTRAVENOUS | Status: DC
  Administered 2023-06-30 – 2023-07-06 (×12): 40 mg via INTRAVENOUS
  Filled 2023-06-30 (×12): qty 10

## 2023-06-30 MED ORDER — AMIODARONE HCL 200 MG PO TABS
400.0000 mg | ORAL_TABLET | Freq: Two times a day (BID) | ORAL | Status: DC
Start: 2023-06-30 — End: 2023-07-01
  Administered 2023-06-30 (×2): 400 mg via ORAL
  Filled 2023-06-30 (×2): qty 2

## 2023-06-30 MED ORDER — POTASSIUM CHLORIDE 10 MEQ/50ML IV SOLN
10.0000 meq | INTRAVENOUS | Status: AC
  Administered 2023-06-30 (×6): 10 meq via INTRAVENOUS
  Filled 2023-06-30 (×6): qty 50

## 2023-06-30 MED ORDER — SODIUM CHLORIDE 0.9% IV SOLUTION: Freq: Once | INTRAVENOUS | Status: AC

## 2023-06-30 MED ORDER — EZETIMIBE 10 MG PO TABS
10.0000 mg | ORAL_TABLET | Freq: Every day | ORAL | Status: DC
  Administered 2023-06-30 – 2023-07-20 (×19): 10 mg via ORAL
  Filled 2023-06-30 (×19): qty 1

## 2023-06-30 MED ORDER — POTASSIUM CHLORIDE 10 MEQ/50ML IV SOLN
10.0000 meq | INTRAVENOUS | Status: AC
  Administered 2023-06-30 (×3): 10 meq via INTRAVENOUS
  Filled 2023-06-30 (×3): qty 50

## 2023-06-30 MED ORDER — DOCUSATE SODIUM 100 MG PO CAPS: 100.0000 mg | ORAL_CAPSULE | Freq: Two times a day (BID) | ORAL | Status: DC | PRN

## 2023-06-30 MED ORDER — POLYETHYLENE GLYCOL 3350 17 G PO PACK: 17.0000 g | PACK | Freq: Every day | ORAL | Status: DC | PRN

## 2023-06-30 MED ORDER — ACETAMINOPHEN 325 MG PO TABS
650.0000 mg | ORAL_TABLET | Freq: Four times a day (QID) | ORAL | Status: DC | PRN
  Administered 2023-06-30 (×2): 650 mg
  Filled 2023-06-30 (×2): qty 2

## 2023-06-30 MED ORDER — HYDRALAZINE HCL 25 MG PO TABS
25.0000 mg | ORAL_TABLET | Freq: Three times a day (TID) | ORAL | Status: DC
  Administered 2023-06-30 – 2023-07-01 (×2): 25 mg via ORAL
  Filled 2023-06-30 (×3): qty 1

## 2023-06-30 MED ORDER — POTASSIUM CHLORIDE 10 MEQ/50ML IV SOLN
10.0000 meq | INTRAVENOUS | Status: AC
  Administered 2023-06-30 (×4): 10 meq via INTRAVENOUS
  Filled 2023-06-30 (×4): qty 50

## 2023-06-30 MED ORDER — FUROSEMIDE 10 MG/ML IJ SOLN
40.0000 mg | Freq: Once | INTRAMUSCULAR | Status: AC
  Administered 2023-06-30: 40 mg via INTRAVENOUS
  Filled 2023-06-30: qty 4

## 2023-06-30 MED ORDER — HYDRALAZINE HCL 20 MG/ML IJ SOLN
10.0000 mg | INTRAMUSCULAR | Status: DC | PRN
  Administered 2023-06-30: 10 mg via INTRAVENOUS
  Filled 2023-06-30: qty 1

## 2023-06-30 MED ORDER — HYDROMORPHONE HCL 1 MG/ML IJ SOLN
0.5000 mg | INTRAMUSCULAR | Status: AC | PRN
  Administered 2023-06-30 – 2023-07-01 (×3): 0.5 mg via INTRAVENOUS
  Filled 2023-06-30 (×3): qty 0.5

## 2023-06-30 MED ORDER — CLOPIDOGREL BISULFATE 75 MG PO TABS
75.0000 mg | ORAL_TABLET | Freq: Every day | ORAL | Status: DC
  Administered 2023-06-30: 75 mg via ORAL
  Filled 2023-06-30: qty 1

## 2023-06-30 MED ORDER — ATORVASTATIN CALCIUM 80 MG PO TABS
80.0000 mg | ORAL_TABLET | Freq: Every day | ORAL | Status: DC
  Administered 2023-06-30 – 2023-07-20 (×19): 80 mg via ORAL
  Filled 2023-06-30 (×19): qty 1
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
## 2023-06-30 NOTE — Progress Notes (Addendum)
 eLink Physician-Brief Progress Note Patient Name: Jakobi Thetford DOB: 30-May-1950 MRN: [id number]   Date of Service  06/30/2023  HPI/Events of Note  73 year old male status post PEA cardiac arrest in the setting of decompensated heart failure and cardiogenic shock complicated by respiratory failure. Reporting pain that is poorly controlled with Tylenol.  eICU Interventions  Low-dose Dilaudid as needed added for 3 doses   0127 -patient is hypertensive into the 180s.  Previously received IV hydralazine at 2217 after refusing p.o. hydralazine at 2200.  One-time order for p.o. hydralazine ordered.  Would avoid anxiolytic until blood pressure is controlled.  Update IV as needed hydralazine order to 20 mg dosing.  Intervention Category Intermediate Interventions: Pain - evaluation and management  Eaven Schwager 06/30/2023, 8:47 PM
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
## 2023-06-30 NOTE — Progress Notes (Addendum)
 Advanced Heart Failure Rounding Note  Cardiologist: Nanetta Batty, MD   Chief Complaint: RV failure  Subjective:    Received IV labetalol overnight d/t hypertension. Aline not accurate and was removed.  CO-OX 56.5% off milrinone. Fick CI 2.7 w/ hemoglobin 7.9.   CVP 4.  T max 100.45F overnight. Remains on empiric abx.  Maintaining SR after DCCV yesterday.   Feels okay. Very weak. Cleared by SLP for diet. Sat on edge of bed with PT yesterday.   Objective:    Weight Range: 85.8 kg Body mass index is 24.96 kg/m.   Vital Signs: Temp:  [98.4 F (36.9 C)-100.2 F (37.9 C)] 99.3 F (37.4 C) (03/14 0600) Pulse Rate:  [63-139] 81 (03/14 0600) Resp:  [9-59] 18 (03/14 0600) BP: (88-197)/(36-88) 120/61 (03/14 0600) SpO2:  [88 %-100 %] 99 % (03/14 0600) Arterial Line BP: (87-249)/(26-76) 155/49 (03/14 0600) Weight:  [85.8 kg] 85.8 kg (03/14 0500) Last BM Date : 06/29/23  Weight change: Filed Weights   06/28/23 0400 06/29/23 0500 06/30/23 0500  Weight: 80.3 kg 84.1 kg 85.8 kg   Intake/Output:  Intake/Output Summary (Last 24 hours) at 06/30/2023 6237 Last data filed at 06/30/2023 0600 Gross per 24 hour  Intake 2624.53 ml  Output 2670 ml  Net -45.47 ml    Physical Exam    General:  Fatigued appearing elderly male Neck: no JVD.  Cor: Regular rate & rhythm. No rubs, gallops or murmurs. Lungs: clear Abdomen: soft, nontender, nondistended.  Extremities: Generalized edema Neuro: alert & orientedx3. Affect pleasant   Telemetry   SR 70s-80s, frequent PACs  EKG    N/A  Labs    CBC Recent Labs    06/29/23 1849 06/30/23 0438  WBC 19.7* 14.9*  HGB 8.8* 7.9*  HCT 27.6* 24.3*  MCV 77.1* 76.7*  PLT 250 215   Basic Metabolic Panel Recent Labs    62/83/15 0408 06/29/23 1221 06/29/23 1849 06/30/23 0438  NA 142   < > 140 142  K 3.0*   < > 3.1* 3.1*  CL 109   < > 106 110  CO2 22   < > 21* 20*  GLUCOSE 123*   < > 124* 122*  BUN 57*   < > 55* 57*   CREATININE 2.70*   < > 2.62* 2.55*  CALCIUM 7.6*   < > 7.5* 7.6*  MG 1.9  --   --  2.0  PHOS 3.6  --   --  3.0   < > = values in this interval not displayed.   Liver Function Tests No results for input(s): "AST", "ALT", "ALKPHOS", "BILITOT", "PROT", "ALBUMIN" in the last 72 hours.  No results for input(s): "LIPASE", "AMYLASE" in the last 72 hours. Cardiac Enzymes No results for input(s): "CKTOTAL", "CKMB", "CKMBINDEX", "TROPONINI" in the last 72 hours.  BNP: BNP (last 3 results) Recent Labs    03/09/23 1544 03/11/23 0218 06/27/23 1116  BNP 660.9* 491.3* 646.4*    Imaging   ECHO TEE Result Date: 06/29/2023    TRANSESOPHOGEAL ECHO REPORT   Patient Name:   Billy [name] Date of Exam: 06/29/2023 Medical Rec #:  [id number]  Height:       73.0 in Accession #:    [id number] Weight:       185.4 lb Date of Birth:  08-26-1950  BSA:          2.083 m Patient Age:    72 years   BP:  156/59 mmHg Patient Gender: M          HR:           88 bpm. Exam Location:  Inpatient Procedure: Limited Echo, Color Doppler and Transesophageal Echo (Both Spectral            and Color Flow Doppler were utilized during procedure). Indications:    Cardioversion  History:        Patient has prior history of Echocardiogram examinations, most                 recent 06/27/2023.  Sonographer:    Harriette Bouillon RDCS Referring Phys: [id number] [place] LEE PROCEDURE: The transesophogeal probe was passed without difficulty through the esophogus of the patient. Sedation performed by different physician. The patient developed no complications during the procedure.  IMPRESSIONS  1. Left ventricular ejection fraction, by estimation, is 55%. The left ventricle has normal function. The left ventricle has no regional wall motion abnormalities. There is severe concentric left ventricular hypertrophy.  2. Right ventricular systolic function is mildly reduced. The right ventricular size is normal.  3. Left atrial size was mildly dilated. No  left atrial/left atrial appendage thrombus was detected.  4. Right atrial size was mildly dilated.  5. No PFO or ASD by color doppler.  6. The mitral valve is normal in structure. Trivial mitral valve regurgitation. No evidence of mitral stenosis.  7. The aortic valve is tricuspid. Aortic valve regurgitation is not visualized. No aortic stenosis is present.  8. The inferior vena cava is normal in size with greater than 50% respiratory variability, suggesting right atrial pressure of 3 mmHg. FINDINGS  Left Ventricle: Left ventricular ejection fraction, by estimation, is 55%. The left ventricle has normal function. The left ventricle has no regional wall motion abnormalities. The left ventricular internal cavity size was normal in size. There is severe concentric left ventricular hypertrophy. Right Ventricle: The right ventricular size is normal. No increase in right ventricular wall thickness. Right ventricular systolic function is mildly reduced. Left Atrium: Left atrial size was mildly dilated. No left atrial/left atrial appendage thrombus was detected. Right Atrium: Right atrial size was mildly dilated. Pericardium: There is no evidence of pericardial effusion. Mitral Valve: The mitral valve is normal in structure. Trivial mitral valve regurgitation. No evidence of mitral valve stenosis. Tricuspid Valve: The tricuspid valve is normal in structure. Tricuspid valve regurgitation is trivial. Aortic Valve: The aortic valve is tricuspid. Aortic valve regurgitation is not visualized. No aortic stenosis is present. Pulmonic Valve: The pulmonic valve was normal in structure. Pulmonic valve regurgitation is not visualized. Aorta: The aortic root is normal in size and structure. Venous: The inferior vena cava is normal in size with greater than 50% respiratory variability, suggesting right atrial pressure of 3 mmHg. IAS/Shunts: No PFO or ASD by color doppler.  IVC IVC diam: 1.30 cm Tameah Mihalko McleanMD Electronically signed by  Wilfred Lacy Signature Date/Time: 06/29/2023/8:33:40 PM    Final    Medications:    Scheduled Medications:  atorvastatin  80 mg Oral Daily   Chlorhexidine Gluconate Cloth  6 each Topical Daily   clopidogrel  75 mg Oral Daily   ezetimibe  10 mg Oral Daily   feeding supplement  237 mL Oral TID BM   insulin aspart  0-9 Units Subcutaneous Q4H   pantoprazole (PROTONIX) IV  40 mg Intravenous QHS   sodium chloride flush  10-40 mL Intracatheter Q12H   Infusions:  amiodarone 60 mg/hr (06/30/23 0656)  cefTRIAXone (ROCEPHIN)  IV Stopped (06/29/23 1106)   doxycycline (VIBRAMYCIN) IV Stopped (06/30/23 0027)   heparin 2,050 Units/hr (06/30/23 0600)   potassium chloride 10 mEq (06/30/23 0838)   potassium chloride      PRN Medications: docusate sodium, influenza vaccine adjuvanted, labetalol, ondansetron (ZOFRAN) IV, mouth rinse, pneumococcal 20-valent conjugate vaccine, polyethylene glycol, sodium chloride flush  Patient Profile   Billy [name] is a 73 yo male with PMH of HTN, HLD, prior DVT, CVA, paroxysmal atrial fibrillation, CKD stage IIIb, carotid artery disease s/p endarterectomy, and chronic diastolic CHF. Admitted following out of hospital witnessed cardiac arrest.   Assessment/Plan   1. Cardiac arrest: Witnessed arrest with bystander then EMS CPR.  >20 minutes CPR.  He was shocked for VF.  Had additional 4-5 minutes PEA arrest in ER. ?PE with aspiration given bilateral DVTs.  - Convert IV amiodarone to PO. No recurrent VF. Off inotrope support.  - Heparin gtt.  2. Cardiogenic shock: Initial echo showed EF 45%, diffuse hypokinesis, severe LVH, normal RV size with moderately decreased systolic function, small pericardial effusion, dilated IVC.  Suspicion for PE as above.  PNA on CXR, initial hypotension thought to be due to septic shock.  He is now off pressors.  3. Acute HF with mid range EF: With prominent RV failure. Initial echo showed EF 45%, diffuse hypokinesis, severe LVH, normal  RV size with moderately decreased systolic function, small pericardial effusion, dilated IVC. As above, worry for PE with RV failure. Baseline cardiomyopathy with severe LVH raises concern for cardiac amyloidosis.  TEE 03/13 showed EF 55% with mild RV dysfunction, small IVC.  -CVP 3 today. He has generalized edema. Note his albumin is only 1.7. Hold diuretics today.  -BP elevated. Start po hydralazine 25 TID. Ordered PRN IV hydralazine for SBP > 180. Would avoid IV labetalol. - He should have workup for cardiac amyloidosis, cardiac MRI before discharge.  4. Venous thromboembolism: Bilateral DVTs, mixed acute and chronic.  He is not anticoagulated at home.  With RV failure on echo, strong suspicion for PE.  - Heparin gtt.  - Eventual V/Q scan. No CTA with AKI.  5. CAD: NSTEMI with HS-TnI 3045.  ?Demand ischemia from arrest vs ACS.  He has history of carotid disease. - Continue Plavix (on from home) stopped aspirin with anticoagulation - Atorvastatin 80 daily.  - Avoid coronary angiography with no STEMI and AKI.  6. Carotid stenosis: h/o CVA.   - R endarterectomy in 2024.  7. Atrial fibrillation: He was not on anticoagulation at home though has history of prior CVA.  Anticoagulation stopped d/t GI bleeds. He went into AF/RVR here, tolerated poorly.  Maintaining SR after TEE/DCCV on 03/13. - Heparin gtt, not sure if he will tolerate long-term anticoagulation. - Switch amiodarone gtt to po amio today 8. AKI on CKD 3b: Suspect ATN in setting of hypotension/arrest.  Baseline Scr seems to be around 2.1. Creatinine stable at 2.55 today. 9. ID: PCT 8.37, suspect PNA.   - Covering with ceftriaxone/doxycycline.  10. Neuro: Somewhat slowed post arrest. 11. Anemia: Hgb trending down last few days, 11.2 on admit. 7.9 today.  - Had bloody BM yesterday evening - Hx of recurrent GI bleeding. This has prohibited anticoagulation in the past. Will discuss anticoagulation with Dr. Shirlee Latch.  Needs aggressive  PT/OT.   Length of Stay: 4  FINCH, LINDSAY N, PA-C  06/30/2023, 9:22 AM  Advanced Heart Failure Team Pager [phone] (M-F; 7a - 5p)  Please contact [facility] for night-coverage  after hours (5p -7a ) and weekends on [email or web address]  Patient seen with PA, I formulated the plan and agree with the above note.   Tm 100.2, on doxycycline/ceftriaxone for possible PNA    Had hematochezia this morning, has prior history of GI bleeding on anticoagulation.  Hgb 7.9 today.  Creatinine stable 2.55.    He remains in NSR on amiodarone gtt. Heparin gtt stopped with GI bleeding.   CVP 5.   General: NAD Neck: No JVD, no thyromegaly or thyroid nodule.  Lungs: Clear to auscultation bilaterally with normal respiratory effort. CV: Nondisplaced PMI.  Heart regular S1/S2, no S3/S4, no murmur.  1+ edema to knees.  Abdomen: Soft, nontender, no hepatosplenomegaly, no distention.  Skin: Intact without lesions or rashes.  Neurologic: Alert and oriented x 3.  Psych: Normal affect. Extremities: No clubbing or cyanosis.  HEENT: Normal.   Heparin now off due to GI bleeding/hematochezia.  This is problematic given DCCV yesterday as well as DVTs and suspected PE.   - GI to see - Will transfuse 1 unit PRBCs with Lasix 40 mg IV x 1.  - PPI IV - If we are unable to anticoagulate, he will need IVC filter.  - Will stop Plavix for now. He had TCAR in 6/24, need Plavix for 3 months post.    Volume status looks ok, as above will give IV Lasix with blood.   He remains in NSR, will transition to po amiodarone 400 bid.   Marca Ancona 06/30/2023 12:03 PM

## 2023-06-30 NOTE — Progress Notes (Signed)
 NAME:  Billy [name], MRN:  [id number], DOB:  [birth year], LOS: 4 ADMISSION DATE:  07-25-2023, CONSULTATION DATE:  July 25, 2023 REFERRING MD:  ED Physician, CHIEF COMPLAINT:  Cardiac Arrest   History of Present Illness:  73 y/o male with h/o CHF, CKD stage IV, Atrial Fibrillation, BPH, Anemia, carotid artery disease s/p endarterectomy and stenting, chornic rhinitis, HLD and RLS who went to his neighbors house today after having SOB for at least 1 week and had a cardiac arrest.  Neighbor stated CPR immediately and EMS continued .  He got ROSC back after about 15 min.  Then in the ED he arrested again for about 2-5 min and then achieved ROSC.  In the ED he was given 1 Epi and had chest compressions.   In the ED he was seen by Cardiology for possible STEMI, but felt as though he was not a a STEMI.  Pertinent  Medical History  Chronic kidney disease, stage 4 (severe)  Atherosclerosis of renal artery Unspecified atrial fibrillation  Personal history of other venous thrombosis and embolism Chronic systolic (congestive) heart failure  Significant Hospital Events: Including procedures, antibiotic start and stop dates in addition to other pertinent events   2023/07/25: Post code in field, coded x 3 min in ED, CPR and 1 epinephrine given to achieve ROSC, intubated 3/11: following commands  3/12: extubated on [name] 3/13:  Con't on 5LNC. He says he feels sob with laying flat.  Underwent TEE which was negative for LAA thrombus therefore patient was cardioverted to NSR complications 3/14 No acute issues overnight, remains in NSR  Interim History / Subjective:  Seen lying in bed with no acute complaints   Objective   Blood pressure 120/61, pulse 81, temperature 99.3 F (37.4 C), resp. rate 18, height 6\' 1"  (1.854 m), weight 85.8 kg, SpO2 99%. CVP:  [0 mmHg-24 mmHg] 5 mmHg      Intake/Output Summary (Last 24 hours) at 06/30/2023 0824 Last data filed at 06/30/2023 0600 Gross per 24 hour  Intake 2722.15 ml   Output 2705 ml  Net 17.15 ml   Filed Weights   06/28/23 0400 06/29/23 0500 06/30/23 0500  Weight: 80.3 kg 84.1 kg 85.8 kg    Examination: General: Acute on chronically ill appearing elderly male sitting up in bed, in NAD HEENT: [place]/AT, MM pink/moist, PERRL,  Neuro: Alert and oriented, slightly delayed and confused  CV: s1s2 regular rate and rhythm, no murmur, rubs, or gallops,  PULM:  Clear to ascultation, no increased work of breathing on 5L  GI: soft, bowel sounds active in all 4 quadrants, non-tender, non-distended, tolerating oral diet Extremities: warm/dry, no edema  Skin: no rashes or lesions  Resolved Hospital Problem list    Assessment & Plan:  PEA cardiac arrest -Appears to be estimated ~20 minutes of downtime moving spontaneously 3/11 AM and following commands. Suspect may have been respiratory culprit given history of 1 week of SOB. No shockable rhythm. Initially called code stemi and seen by cards and d/c. Initial CT without devastating anoxic injury P: Supportive care as below  Decompensated heart failure  Cardiogenic shock  RV failure NSTEMI  Atrial fibrillation with RVR, new -3/11 echo EF 55%, LV severely decreased function, G2DD, RV severely reduced. Lactic down trending. Previous concern for cardiac amyloid which was not worked up. Coox 66. After extubation went into afib rvr requiring amio bolus and gtt.  -TEE 3/13 with EF 55% with severe LVH, normal RV size with mild RV dysfunction, IVC normal.  -Milrinone  stopped 3/13 P: Heart failure following, appreciate assistance Follow amyloidosis panel Continue to monitor for need of diuresing Follow lactic acid Sildenafil feel started 3/13  Acute hypoxic respiratory failure Pulmonary edema  Possible pneumonia vs pulmonary embolism (+DVT studies) -1 week of worsening sob prior to cardiac arrest. Had positive DVT studies 3/11 so concern for pulmonary embolism but cannot get cta pe at this time.  -Procalcitonin  8.37 on 3/11 P: Continue empiric CAP coverage Heparin drip as above Wean supplemental oxygen as able Consider VQ scan  Acute kidney injury on chronic kidney disease  AGMA, resolved, resolved  Lactic acidosis, resolved  -sCr labile but appears baseline ~ 2.1. Presenting 2.1 > 2.8 >2.7. BUN 55. K 3.5. Likely pre-renal azotemia in the setting of cardiac arrest, shock. Acidotic on arrival initially on bicarb gtt which has since been turned off. Oliguric 3/11 with improved urine output since  P: Follow renal function monitor urine output Trend Bmet Avoid nephrotoxins Ensure adequate renal perfusion  Optimize electrolytes  Best Practice (right click and "Reselect all SmartList Selections" daily)   Diet/type: full liquids  DVT prophylaxis systemic heparin Pressure ulcer(s): N/A GI prophylaxis: PPI Lines: Central line and yes and it is still needed Foley:  Yes, and it is still needed Code Status:  full code  Son and daughter updated at bedside 3/14  Critical care time: NA   Billy Neumeister D. Harris, NP-C  Pulmonary & Critical Care Personal contact information can be found on Amion  If no contact or response made please call [phone] 06/30/2023, 8:34 AM
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
## 2023-06-30 NOTE — Progress Notes (Signed)
 Inpatient Rehab Admissions Coordinator:   Per therapy recommendations, patient was screened for CIR candidacy by Megan Salon, MS, CCC-SLP. At this time, Pt. is not yet transferring OOB and is not at a level to tolerate the intensity of CIR; however,   Pt. may have potential to progress to becoming a potential CIR candidate, so CIR admissions team will follow and monitor for progress and participation with therapies and place consult order if Pt. appears to be an appropriate candidate. Please contact me with any questions.   Megan Salon, MS, CCC-SLP Rehab Admissions Coordinator  [phone] (celll) [phone] (office)

## 2023-07-01 ENCOUNTER — Encounter (HOSPITAL_COMMUNITY): Payer: Self-pay | Admitting: Pulmonary Disease

## 2023-07-01 ENCOUNTER — Encounter (HOSPITAL_COMMUNITY)
Admission: EM | Disposition: A | Discharge status: Skilled Nursing Facility | Payer: Self-pay | Source: Home / Self Care | Attending: Internal Medicine

## 2023-07-01 ENCOUNTER — Inpatient Hospital Stay (HOSPITAL_COMMUNITY): Admitting: Anesthesiology

## 2023-07-01 DIAGNOSIS — I5033 Acute on chronic diastolic (congestive) heart failure: Secondary | ICD-10-CM

## 2023-07-01 DIAGNOSIS — I4891 Unspecified atrial fibrillation: Secondary | ICD-10-CM | POA: Diagnosis not present

## 2023-07-01 DIAGNOSIS — K922 Gastrointestinal hemorrhage, unspecified: Secondary | ICD-10-CM | POA: Diagnosis not present

## 2023-07-01 DIAGNOSIS — N1832 Chronic kidney disease, stage 3b: Secondary | ICD-10-CM

## 2023-07-01 DIAGNOSIS — R570 Cardiogenic shock: Secondary | ICD-10-CM | POA: Diagnosis not present

## 2023-07-01 DIAGNOSIS — K625 Hemorrhage of anus and rectum: Secondary | ICD-10-CM

## 2023-07-01 DIAGNOSIS — I469 Cardiac arrest, cause unspecified: Secondary | ICD-10-CM | POA: Diagnosis not present

## 2023-07-01 DIAGNOSIS — I130 Hypertensive heart and chronic kidney disease with heart failure and stage 1 through stage 4 chronic kidney disease, or unspecified chronic kidney disease: Secondary | ICD-10-CM

## 2023-07-01 DIAGNOSIS — I214 Non-ST elevation (NSTEMI) myocardial infarction: Secondary | ICD-10-CM | POA: Diagnosis not present

## 2023-07-01 HISTORY — PX: ESOPHAGOGASTRODUODENOSCOPY: SHX5428

## 2023-07-01 LAB — GLUCOSE, CAPILLARY
Glucose-Capillary: 97 mg/dL (ref 70–99)
Glucose-Capillary: 105 mg/dL — ABNORMAL HIGH (ref 70–99)
Glucose-Capillary: 102 mg/dL — ABNORMAL HIGH (ref 70–99)
Glucose-Capillary: 106 mg/dL — ABNORMAL HIGH (ref 70–99)
Glucose-Capillary: 90 mg/dL (ref 70–99)
Glucose-Capillary: 111 mg/dL — ABNORMAL HIGH (ref 70–99)

## 2023-07-01 LAB — BASIC METABOLIC PANEL
Anion gap: 10 (ref 5–15)
BUN: 51 mg/dL — ABNORMAL HIGH (ref 8–23)
CO2: 22 mmol/L (ref 22–32)
Calcium: 8.1 mg/dL — ABNORMAL LOW (ref 8.9–10.3)
Chloride: 114 mmol/L — ABNORMAL HIGH (ref 98–111)
Creatinine, Ser: 2.28 mg/dL — ABNORMAL HIGH (ref 0.61–1.24)
GFR, Estimated: 30 mL/min — ABNORMAL LOW (ref >60)
Glucose, Bld: 108 mg/dL — ABNORMAL HIGH (ref 70–99)
Potassium: 3.7 mmol/L (ref 3.5–5.1)
Sodium: 146 mmol/L — ABNORMAL HIGH (ref 135–145)

## 2023-07-01 LAB — BPAM RBC
Blood Product Expiration Date: 202504072359
ISSUE DATE / TIME: 202503141314
Unit Type and Rh: 6200

## 2023-07-01 LAB — CBC
HCT: 31.7 % — ABNORMAL LOW (ref 39.0–52.0)
HCT: 31.5 % — ABNORMAL LOW (ref 39.0–52.0)
Hemoglobin: 9.9 g/dL — ABNORMAL LOW (ref 13.0–17.0)
Hemoglobin: 9.8 g/dL — ABNORMAL LOW (ref 13.0–17.0)
MCH: 24.4 pg — ABNORMAL LOW (ref 26.0–34.0)
MCH: 24.7 pg — ABNORMAL LOW (ref 26.0–34.0)
MCHC: 31.2 g/dL (ref 30.0–36.0)
MCHC: 31.1 g/dL (ref 30.0–36.0)
MCV: 78.1 fL — ABNORMAL LOW (ref 80.0–100.0)
MCV: 79.3 fL — ABNORMAL LOW (ref 80.0–100.0)
Platelets: 195 10*3/uL (ref 150–400)
Platelets: 172 10*3/uL (ref 150–400)
RBC: 4.06 MIL/uL — ABNORMAL LOW (ref 4.22–5.81)
RBC: 3.97 MIL/uL — ABNORMAL LOW (ref 4.22–5.81)
RDW: 17.3 % — ABNORMAL HIGH (ref 11.5–15.5)
RDW: 17.5 % — ABNORMAL HIGH (ref 11.5–15.5)
WBC: 17.7 10*3/uL — ABNORMAL HIGH (ref 4.0–10.5)
WBC: 16.3 10*3/uL — ABNORMAL HIGH (ref 4.0–10.5)
nRBC: 0.6 % — ABNORMAL HIGH (ref 0.0–0.2)
nRBC: 1.2 % — ABNORMAL HIGH (ref 0.0–0.2)

## 2023-07-01 LAB — TYPE AND SCREEN
ABO/RH(D): A POS
Antibody Screen: NEGATIVE
Unit division: 0

## 2023-07-01 LAB — PHOSPHORUS: Phosphorus: 3.1 mg/dL (ref 2.5–4.6)

## 2023-07-01 LAB — COOXEMETRY PANEL
Carboxyhemoglobin: 2.3 % — ABNORMAL HIGH (ref 0.5–1.5)
Methemoglobin: <0.7 % (ref 0.0–1.5)
O2 Saturation: 90.8 %
Total hemoglobin: 10.4 g/dL — ABNORMAL LOW (ref 12.0–16.0)

## 2023-07-01 LAB — MAGNESIUM: Magnesium: 2.1 mg/dL (ref 1.7–2.4)

## 2023-07-01 SURGERY — EGD (ESOPHAGOGASTRODUODENOSCOPY)
Anesthesia: Monitor Anesthesia Care

## 2023-07-01 MED ORDER — HYDROMORPHONE HCL 1 MG/ML IJ SOLN
0.5000 mg | INTRAMUSCULAR | Status: DC | PRN
  Administered 2023-07-01 – 2023-07-02 (×4): 0.5 mg via INTRAVENOUS
  Filled 2023-07-01 (×4): qty 0.5

## 2023-07-01 MED ORDER — SERTRALINE HCL 50 MG PO TABS
50.0000 mg | ORAL_TABLET | Freq: Every day | ORAL | Status: DC
  Administered 2023-07-01 – 2023-07-19 (×18): 50 mg via ORAL
  Filled 2023-07-01 (×18): qty 1

## 2023-07-01 MED ORDER — HYDRALAZINE HCL 20 MG/ML IJ SOLN
20.0000 mg | INTRAMUSCULAR | Status: DC | PRN
  Administered 2023-07-01 – 2023-07-03 (×2): 20 mg via INTRAVENOUS
  Filled 2023-07-01 (×2): qty 1

## 2023-07-01 MED ORDER — AMIODARONE HCL IN DEXTROSE 360-4.14 MG/200ML-% IV SOLN
30.0000 mg/h | INTRAVENOUS | Status: DC
  Administered 2023-07-01: 30 mg/h via INTRAVENOUS
  Filled 2023-07-01 (×2): qty 200

## 2023-07-01 MED ORDER — AMIODARONE LOAD VIA INFUSION
150.0000 mg | Freq: Once | INTRAVENOUS | Status: AC
  Administered 2023-07-01: 150 mg via INTRAVENOUS

## 2023-07-01 MED ORDER — POTASSIUM CHLORIDE 10 MEQ/50ML IV SOLN
10.0000 meq | INTRAVENOUS | Status: AC
  Administered 2023-07-01 (×4): 10 meq via INTRAVENOUS
  Filled 2023-07-01 (×4): qty 50

## 2023-07-01 MED ORDER — PHENYLEPHRINE 80 MCG/ML (10ML) SYRINGE FOR IV PUSH (FOR BLOOD PRESSURE SUPPORT)
PREFILLED_SYRINGE | INTRAVENOUS | Status: DC | PRN
  Administered 2023-07-01 (×2): 80 ug via INTRAVENOUS

## 2023-07-01 MED ORDER — METHOCARBAMOL 1000 MG/10ML IJ SOLN: 500.0000 mg | Freq: Four times a day (QID) | INTRAMUSCULAR | Status: DC | PRN

## 2023-07-01 MED ORDER — PROPOFOL 500 MG/50ML IV EMUL
INTRAVENOUS | Status: DC | PRN
  Administered 2023-07-01: 180 ug/kg/min via INTRAVENOUS

## 2023-07-01 MED ORDER — SODIUM CHLORIDE 0.9 % IV SOLN: INTRAVENOUS | Status: DC | PRN

## 2023-07-01 MED ORDER — LIDOCAINE 2% (20 MG/ML) 5 ML SYRINGE
INTRAMUSCULAR | Status: DC | PRN
  Administered 2023-07-01: 40 mg via INTRAVENOUS

## 2023-07-01 MED ORDER — LORAZEPAM 2 MG/ML IJ SOLN: 0.5000 mg | Freq: Once | INTRAMUSCULAR | Status: DC

## 2023-07-01 MED ORDER — PROPOFOL 10 MG/ML IV BOLUS
INTRAVENOUS | Status: DC | PRN
  Administered 2023-07-01: 20 mg via INTRAVENOUS
  Administered 2023-07-01: 10 mg via INTRAVENOUS

## 2023-07-01 MED ORDER — HYDRALAZINE HCL 50 MG PO TABS
50.0000 mg | ORAL_TABLET | Freq: Three times a day (TID) | ORAL | Status: DC
  Administered 2023-07-01 – 2023-07-02 (×2): 50 mg via ORAL
  Filled 2023-07-01 (×3): qty 1

## 2023-07-01 MED ORDER — AMIODARONE HCL IN DEXTROSE 360-4.14 MG/200ML-% IV SOLN
30.0000 mg/h | INTRAVENOUS | Status: DC
  Administered 2023-07-01: 30 mg/h via INTRAVENOUS

## 2023-07-01 MED ORDER — ACETAMINOPHEN 10 MG/ML IV SOLN
1000.0000 mg | Freq: Four times a day (QID) | INTRAVENOUS | Status: AC
  Administered 2023-07-01 – 2023-07-02 (×4): 1000 mg via INTRAVENOUS
  Filled 2023-07-01 (×4): qty 100

## 2023-07-01 MED ORDER — LABETALOL HCL 5 MG/ML IV SOLN: 10.0000 mg | INTRAVENOUS | Status: DC | PRN

## 2023-07-01 MED ORDER — AMIODARONE HCL IN DEXTROSE 360-4.14 MG/200ML-% IV SOLN
60.0000 mg/h | INTRAVENOUS | Status: AC
  Administered 2023-07-01: 60 mg/h via INTRAVENOUS
  Filled 2023-07-01: qty 400

## 2023-07-01 MED ORDER — HYDRALAZINE HCL 25 MG PO TABS
25.0000 mg | ORAL_TABLET | Freq: Once | ORAL | Status: AC
  Administered 2023-07-01: 25 mg via ORAL
  Filled 2023-07-01: qty 1

## 2023-07-01 MED ORDER — EPHEDRINE SULFATE-NACL 50-0.9 MG/10ML-% IV SOSY
PREFILLED_SYRINGE | INTRAVENOUS | Status: DC | PRN
  Administered 2023-07-01 (×2): 5 mg via INTRAVENOUS

## 2023-07-01 NOTE — Interval H&P Note (Signed)
 History and Physical Interval Note: 73year-old male status postcardiac arrest, extensive bilateral DVT with history of duodenal ulcer and rectal bleeding for an EGD with propofol.  07/01/2023 12:03 PM  Billy [name]  has presented today for EGD with propofol, with the diagnosis of GI bleed.  The various methods of treatment have been discussed with the patient and family. After consideration of risks, benefits and other options for treatment, the patient has consented to  Procedure(s): EGD (ESOPHAGOGASTRODUODENOSCOPY) (N/A) as a surgical intervention.  The patient's history has been reviewed, patient examined, no change in status, stable for surgery.  I have reviewed the patient's chart and labs.  Questions were answered to the patient's satisfaction.     Kerin Salen

## 2023-07-01 NOTE — Transfer of Care (Signed)
 Immediate Anesthesia Transfer of Care Note  Patient: Billy [name]  Procedure(s) Performed: EGD (ESOPHAGOGASTRODUODENOSCOPY)  Patient Location: Endoscopy Unit  Anesthesia Type:MAC  Level of Consciousness: sedated  Airway & Oxygen Therapy: Patient Spontanous Breathing and Patient connected to face mask oxygen  Post-op Assessment: Report given to RN and Post -op Vital signs reviewed and stable  Post vital signs: Reviewed and stable  Last Vitals:  Vitals Value Taken Time  BP    Temp    Pulse    Resp    SpO2      Last Pain:  Vitals:   07/01/23 1208  TempSrc: Temporal  PainSc:       Patients Stated Pain Goal: 0 (07/01/23 0800)  Complications: No notable events documented.

## 2023-07-01 NOTE — Anesthesia Preprocedure Evaluation (Addendum)
 Anesthesia Evaluation  Patient identified by MRN, date of birth, ID band Patient awake    Reviewed: Allergy & Precautions, NPO status , Patient's Chart, lab work & pertinent test results, reviewed documented beta blocker date and time   History of Anesthesia Complications (+) PONV and history of anesthetic complications  Airway Mallampati: II  TM Distance: >3 FB Neck ROM: Full    Dental  (+) Dental Advisory Given   Pulmonary   Suspected PE     + decreased breath sounds      Cardiovascular hypertension, Pt. on home beta blockers and Pt. on medications + Past MI, +CHF and + DVT  + dysrhythmias Atrial Fibrillation and Ventricular Fibrillation  Rhythm:Irregular Rate:Normal   Cardiac arrest 06/26/23  '25 TEE - EF 55%. There is severe concentric left ventricular hypertrophy. Right ventricular systolic function is mildly reduced. Left atrial size was mildly dilated. Right atrial size was mildly dilated. Trivial mitral valve regurgitation.     Neuro/Psych TIA Neuromuscular disease  negative psych ROS   GI/Hepatic negative GI ROS, Neg liver ROS,,,  Endo/Other  negative endocrine ROS    Renal/GU CRFRenal disease     Musculoskeletal negative musculoskeletal ROS (+)    Abdominal   Peds  Hematology  (+) Blood dyscrasia, anemia  On Plavix    Anesthesia Other Findings   Reproductive/Obstetrics                             Anesthesia Physical Anesthesia Plan  ASA: 4  Anesthesia Plan: MAC   Post-op Pain Management: Minimal or no pain anticipated   Induction:   PONV Risk Score and Plan: 2 and Propofol infusion and Treatment may vary due to age or medical condition  Airway Management Planned: Nasal Cannula and Natural Airway  Additional Equipment: None  Intra-op Plan:   Post-operative Plan:   Informed Consent: I have reviewed the patients History and Physical, chart, labs and discussed  the procedure including the risks, benefits and alternatives for the proposed anesthesia with the patient or authorized representative who has indicated his/her understanding and acceptance.       Plan Discussed with: CRNA and Anesthesiologist  Anesthesia Plan Comments:        Anesthesia Quick Evaluation

## 2023-07-01 NOTE — Anesthesia Postprocedure Evaluation (Signed)
 Anesthesia Post Note  Patient: Billy [name]  Procedure(s) Performed: EGD (ESOPHAGOGASTRODUODENOSCOPY)     Patient location during evaluation: PACU Anesthesia Type: MAC Level of consciousness: awake and alert Pain management: pain level controlled Vital Signs Assessment: post-procedure vital signs reviewed and stable Respiratory status: spontaneous breathing, nonlabored ventilation and respiratory function stable Cardiovascular status: stable and blood pressure returned to baseline Anesthetic complications: no   No notable events documented.  Last Vitals:  Vitals:   07/01/23 1208 07/01/23 1330  BP: (!) 189/90 (!) 151/72  Pulse: 97 94  Resp:  20  Temp: 36.5 C 37.1 C  SpO2: 93% 95%    Last Pain:  Vitals:   07/01/23 1315  TempSrc:   PainSc: Asleep                 Beryle Lathe

## 2023-07-01 NOTE — Consult Note (Cosign Needed Addendum)
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 cm/s MV E/A ratio:  1.65 Arvilla Meres MD Electronically signed by Arvilla Meres MD Signature Date/Time: 06/27/2023/9:52:54 AM    Final    CT HEAD WO CONTRAST ( ) Result Date: 06/27/2023 CLINICAL DATA:  Mental status change with unknown cause. Cardiac arrest EXAM: CT HEAD WITHOUT CONTRAST TECHNIQUE: Contiguous axial images were obtained from the base of the skull through the vertex without intravenous contrast. RADIATION DOSE REDUCTION: This exam was performed according to the departmental dose-optimization program which includes automated exposure control, adjustment of the mA and/or kV according to patient size and/or use of iterative reconstruction technique. COMPARISON:  02/16/2022 FINDINGS: Brain: No evidence of acute infarction, hemorrhage, hydrocephalus, extra-axial collection or mass lesion/mass effect. Streak artifact from support hardware is a limiting factor. No evidence of global anoxic injury. Vascular: Calcification at the lower right sylvian fissure is  unchanged. No hyperdense vessel. Skull: Negative Sinuses/Orbits: Negative IMPRESSION: Stable from 2023.  No acute finding. Electronically  Signed   By: Tiburcio Pea M.D.   On: 06/27/2023 05:03   DG Chest Portable 1 View Result Date: 06/26/2023 CLINICAL DATA:  Central venous catheter placement EXAM: PORTABLE CHEST 1 VIEW COMPARISON:  8:26 p.m. FINDINGS: Left subclavian central venous catheter has been placed with its tip overlying the expected superior vena cava. Endotracheal tube is unchanged tip 4.5 cm above the carina. Nasogastric tube extends into the upper abdomen beyond the margin of the examination. Pulmonary insufflation is stable the lungs are well expanded. Small bilateral pleural effusions are suspected on this supine examination accounting for opacity at the lung apices bilaterally. No definite pneumothorax on this supine examination. Extensive diffuse airspace infiltrate with no avulsed caring of the costophrenic angles is again seen most in keeping with noncardiogenic pulmonary edema though extensive pneumonic infiltrate, diffuse pulmonary hemorrhage, or conditions such as alveolar proteinosis could appear similarly. Cardiac size within normal limits. Implanted loop recorder again noted. IMPRESSION: 1. Interval placement of left subclavian central venous catheter with its tip overlying the expected superior vena cava. No definite pneumothorax on this supine examination. 2. Stable extensive diffuse airspace infiltrate most in keeping with noncardiogenic pulmonary edema though extensive pneumonic infiltrate, diffuse pulmonary hemorrhage, or conditions such as alveolar proteinosis could appear similarly. Electronically Signed   By: Helyn Numbers M.D.   On: 06/26/2023 23:31   DG Abdomen 1 View Result Date: 06/26/2023 CLINICAL DATA:  Orogastric tube placement EXAM: ABDOMEN - 1 VIEW COMPARISON:  None Available. FINDINGS: Orogastric tube tip overlies the mid body of the stomach. Nonobstructive bowel  gas pattern. No free intraperitoneal gas. Bilateral pleural effusions and extensive diffuse airspace infiltrate again noted within the visualized lungs. IMPRESSION: 1. Orogastric tube tip within the mid body of the stomach. Electronically Signed   By: Helyn Numbers M.D.   On: 06/26/2023 23:27   DG Chest Portable 1 View Result Date: 06/26/2023 CLINICAL DATA:  Status post cpr.  Intubation EXAM: PORTABLE CHEST 1 VIEW COMPARISON:  CT angio neck 02/16/2022, chest x-ray 03/09/2023, chest x-ray 04/11/2018 FINDINGS: Endotracheal tube with tip terminating 4.5 cm above the carina. Cardiac paddles overlie the chest. The heart and mediastinal contours are unchanged. Atherosclerotic plaque. Diffuse bat wing perihilar interstitial and airspace opacities. at least trace to small left pleural effusion. Right costophrenic angle collimated off view. Question trace apical right hydropneumothorax with associated calcification along the expected pleural line. No left pneumothorax. No acute osseous abnormality. IMPRESSION: 1. Question trace apical right hydropneumothorax. Recommend attention on follow-up. 2. Findings suggestive of ARDS. Superimposed infection not excluded. 3. At least trace to small left pleural effusion. Electronically Signed   By: Tish Frederickson M.D.   On: 06/26/2023 22:57    Labs:  CBC: Recent Labs    06/30/23 0438 06/30/23 1717 07/01/23 0500 07/01/23 1557  WBC 14.9* 17.7* 17.7* 16.3*  HGB 7.9* 9.7* 9.9* 9.8*  HCT 24.3* 30.3* 31.7* 31.5*  PLT 215 209 195 172    COAGS: Recent Labs    07/22/22 1400 10/04/22 1050 03/11/23 0218  INR 1.2 1.2 1.3*  APTT 33 34 39*    BMP: Recent Labs    06/29/23 1849 06/30/23 0438 06/30/23 1717 07/01/23 0500  NA 140 142 144 146*  K 3.1* 3.1* 3.6 3.7  CL 106 110 110 114*  CO2 21* 20* 20* 22  GLUCOSE 124* 122* 103* 108*  BUN 55* 57* 53* 51*  CALCIUM 7.5* 7.6* 7.7* 8.1*  CREATININE 2.62* 2.55* 2.38* 2.28*  GFRNONAA 25* 26* 29* 30*  LIVER  FUNCTION TESTS: Recent Labs    07/22/22 1400 10/04/22 1050 06/27/23 0333  BILITOT 0.6 0.7 0.7  AST 23 24 78*  ALT 18 18 57*  ALKPHOS 56 44 63  PROT 6.7 6.4* 4.5*  ALBUMIN 3.6 3.6 1.7*    TUMOR MARKERS: No results for input(s): "AFPTM", "CEA", "CA199", "CHROMGRNA" in the last 8760 hours.  Assessment and Plan: 73 y.o. male with PMH sig for anemia, PAF, HLD, LE DVT/?prior PE , HTN, CHF, CAD with prior MI, lower GI bleed 2019 with known duodenal ulcer, CKD, TIA who was admitted to [facility] on 3/10 after witnessed cardiac arrest. He was found to have refractory v fib and subsequently diagnosed with NSTEMI. In addition he was noted to have extensive bilateral DVT. He was started on ASA/plavix, and later IV heparin. He then developed rectal bleeding 3/13 and IV heparin stopped. EGD performed today revealed:   Normal esophagus.                           - Erosive gastropathy with no bleeding and no                            stigmata of recent bleeding.                           - Normal gastric fundus, gastric body, incisura and                            antrum.                           - Duodenal deformity.                           - Bleeding friable duodenal mucosa. Hemostatic                            spray applied.   He is afebrile, WBC 16.3, hgb 9.8, plts nl, creat 2.28, PT/INR pend; request now received from CCM for IVC filter placement; case reviewed by Dr. Fredia Sorrow.Risks and benefits discussed with the patient/sister including, but not limited to bleeding, infection, contrast induced renal failure, filter fracture or migration which can lead to emergency surgery or even death, strut penetration with damage or irritation to adjacent structures and caval thrombosis.  All of the patient's questions were answered, patient is agreeable to proceed. Consent signed and in chart.  Procedure tent scheduled for 3/16   Thank you for allowing our service to participate in [facility] 's  care.  Electronically Signed: D. Jeananne Rama, PA-C   07/01/2023, 5:40 PM      I spent a total of 40 Minutes    in face to face in clinical consultation, greater than 50% of which was counseling/coordinating care for inferior vena cava filter placement

## 2023-07-01 NOTE — Op Note (Signed)
 [facility] Patient Name: Billy [name] Procedure Date : 07/01/2023 MRN: [id number] Attending MD: Kerin Salen , MD, [id number] Date of Birth: 1951/04/12 CSN: [id number] Age: 73 Admit Type: Inpatient Procedure:                Upper GI endoscopy Indications:              Hematochezia, Melena Providers:                Kerin Salen, MD, Margaree Mackintosh, RN, Brion Aliment, Technician Referring MD:             Critical Care Team Medicines:                Monitored Anesthesia Care Complications:            No immediate complications. Estimated Blood Loss:     Estimated blood loss: none. Procedure:                Pre-Anesthesia Assessment:                           - Prior to the procedure, a History and Physical                            was performed, and patient medications and                            allergies were reviewed. The patient's tolerance of                            previous anesthesia was also reviewed. The risks                            and benefits of the procedure and the sedation                            options and risks were discussed with the patient.                            All questions were answered, and informed consent                            was obtained. Prior Anticoagulants: The patient has                            taken Plavix (clopidogrel), last dose was 1 day                            prior to procedure. ASA Grade Assessment: IV - A                            patient with severe systemic disease that is a  constant threat to life. After reviewing the risks                            and benefits, the patient was deemed in                            satisfactory condition to undergo the procedure.                           After obtaining informed consent, the endoscope was                            passed under direct vision. Throughout the                             procedure, the patient's blood pressure, pulse, and                            oxygen saturations were monitored continuously. The                            GIF-H190 ([id number]) Olympus endoscope was introduced                            through the mouth, and advanced to the second part                            of duodenum. The upper GI endoscopy was                            accomplished without difficulty. The patient                            tolerated the procedure well. Scope In: Scope Out: Findings:      The examined esophagus was normal.      A few localized small erosions with no bleeding and no stigmata of       recent bleeding were found in the gastric fundus and in the gastric       body, ?related to NG tube induced trauma.      The gastric fundus, gastric body, incisura and gastric antrum were       normal.      A severe post-ulcer deformity was found in the first portion of the       duodenum.      Patchy severely friable mucosa with active bleeding was found in the       first portion of the duodenum. To stop active bleeding, hemostatic       spray(PURASTAT GEL) was deployed. There was no bleeding at the end of       the procedure.      (Nexpowder was tried but the device malfunctioned). Impression:               - Normal esophagus.                           - Erosive  gastropathy with no bleeding and no                            stigmata of recent bleeding.                           - Normal gastric fundus, gastric body, incisura and                            antrum.                           - Duodenal deformity.                           - Bleeding friable duodenal mucosa. Hemostatic                            spray applied.                           - No specimens collected. Moderate Sedation:      Patient did not receive moderate sedation for this procedure, but       instead received monitored anesthesia care. Recommendation:           - Perform a CT scan  (computed tomography) of                            abdomen without contrast and pelvis without                            contrast today(rectal exam showed liquid bloody                            stools).                           - Keep him NPO and monitor H and H, transfuse to                            keep Hb above 8.                           - PPI BID for now. Procedure Code(s):        --- Professional ---                           [phone], Esophagogastroduodenoscopy, flexible,                            transoral; with control of bleeding, any method Diagnosis Code(s):        --- Professional ---                           K31.89, Other diseases of stomach and duodenum  K92.2, Gastrointestinal hemorrhage, unspecified                           K92.1, Melena (includes Hematochezia) CPT copyright 2022 [employer]. All rights reserved. The codes documented in this report are preliminary and upon coder review may  be revised to meet current compliance requirements. Kerin Salen, MD 07/01/2023 1:19:20 PM This report has been signed electronically. Number of Addenda: 0

## 2023-07-01 NOTE — Progress Notes (Signed)
 Patient ID: Billy [name], male   DOB: 05-17-1950, 73 y.o.   MRN: [id number]     Advanced Heart Failure Rounding Note  Cardiologist: Nanetta Batty, MD   Chief Complaint: RV failure  Subjective:    Agitated this morning, having trouble coughing up secretions.   Co-ox inaccurate, no pressors/inotropes.  BP elevated.   CVP 7-8.  Afebrile, WBCs 17.7.  He remains on ceftriaxone doxycycline.   Telemetry looks like NSR with frequent atrial runs.  On po amiodarone.  Off heparin with hematochezia.  Hgb up to 9.9 after transfusions yesterday. No further hematochezia overnight.   Objective:    Weight Range: 85.8 kg Body mass index is 24.96 kg/m.   Vital Signs: Temp:  [98.4 F (36.9 C)-100 F (37.8 C)] 98.8 F (37.1 C) (03/15 0700) Pulse Rate:  [73-104] 89 (03/15 0700) Resp:  [17-32] 19 (03/15 0700) BP: (110-198)/(49-101) 198/88 (03/15 0847) SpO2:  [78 %-96 %] 95 % (03/15 0700) Last BM Date : 06/30/23  Weight change: Filed Weights   06/28/23 0400 06/29/23 0500 06/30/23 0500  Weight: 80.3 kg 84.1 kg 85.8 kg   Intake/Output:  Intake/Output Summary (Last 24 hours) at 07/01/2023 0911 Last data filed at 07/01/2023 0500 Gross per 24 hour  Intake 2110 ml  Output 2700 ml  Net -590 ml    Physical Exam    General: Agitated/fidgeting Neck: No JVD, no thyromegaly or thyroid nodule.  Lungs: Clear to auscultation bilaterally with normal respiratory effort. CV: Nondisplaced PMI.  Heart irregular S1/S2, no S3/S4, no murmur.  No peripheral edema.    Abdomen: Soft, nontender, no hepatosplenomegaly, no distention.  Skin: Intact without lesions or rashes.  Neurologic: Alert and oriented x 3.  Psych: Anxious. Extremities: No clubbing or cyanosis.  HEENT: Normal.    Telemetry   Looks like NSR with frequent atrial runs (personally reviewed)  EKG    N/A  Labs    CBC Recent Labs    06/30/23 1717 07/01/23 0500  WBC 17.7* 17.7*  HGB 9.7* 9.9*  HCT 30.3* 31.7*  MCV 78.3* 78.1*   PLT 209 195   Basic Metabolic Panel Recent Labs    40/98/11 0438 06/30/23 1717 07/01/23 0500  NA 142 144 146*  K 3.1* 3.6 3.7  CL 110 110 114*  CO2 20* 20* 22  GLUCOSE 122* 103* 108*  BUN 57* 53* 51*  CREATININE 2.55* 2.38* 2.28*  CALCIUM 7.6* 7.7* 8.1*  MG 2.0  --  2.1  PHOS 3.0  --  3.1   Liver Function Tests No results for input(s): "AST", "ALT", "ALKPHOS", "BILITOT", "PROT", "ALBUMIN" in the last 72 hours.  No results for input(s): "LIPASE", "AMYLASE" in the last 72 hours. Cardiac Enzymes No results for input(s): "CKTOTAL", "CKMB", "CKMBINDEX", "TROPONINI" in the last 72 hours.  BNP: BNP (last 3 results) Recent Labs    03/09/23 1544 03/11/23 0218 06/27/23 1116  BNP 660.9* 491.3* 646.4*    Imaging   No results found.  Medications:    Scheduled Medications:  atorvastatin  80 mg Oral Daily   Chlorhexidine Gluconate Cloth  6 each Topical Daily   ezetimibe  10 mg Oral Daily   feeding supplement  237 mL Oral TID BM   hydrALAZINE  50 mg Oral Q8H   insulin aspart  0-9 Units Subcutaneous Q4H   pantoprazole (PROTONIX) IV  40 mg Intravenous Q12H   sodium chloride flush  10-40 mL Intracatheter Q12H   Infusions:  amiodarone     cefTRIAXone (ROCEPHIN)  IV Stopped (  06/30/23 1037)   doxycycline (VIBRAMYCIN) IV Stopped (06/30/23 2335)   potassium chloride 10 mEq (07/01/23 0902)    PRN Medications: acetaminophen, docusate sodium, hydrALAZINE, HYDROmorphone (DILAUDID) injection, influenza vaccine adjuvanted, ondansetron (ZOFRAN) IV, mouth rinse, pneumococcal 20-valent conjugate vaccine, polyethylene glycol, sodium chloride flush  Patient Profile   Billy [name] is a 73 yo male with PMH of HTN, HLD, prior DVT, CVA, paroxysmal atrial fibrillation, CKD stage IIIb, carotid artery disease s/p endarterectomy, and chronic diastolic CHF. Admitted following out of hospital witnessed cardiac arrest.   Assessment/Plan   1. Cardiac arrest: Witnessed arrest with bystander then  EMS CPR.  >20 minutes CPR.  He was shocked for VF.  Had additional 4-5 minutes PEA arrest in ER. ?PE with aspiration given bilateral DVTs.  - He is on amiodarone. 2. Cardiogenic shock: Initial echo showed EF 45%, diffuse hypokinesis, severe LVH, normal RV size with moderately decreased systolic function, small pericardial effusion, dilated IVC.  Suspicion for PE as above.  PNA on CXR, initial hypotension thought to be due to septic shock.  He is now off pressors.  3. Acute HF with mid range EF: With prominent RV failure. Initial echo showed EF 45%, diffuse hypokinesis, severe LVH, normal RV size with moderately decreased systolic function, small pericardial effusion, dilated IVC. As above, worry for PE with RV failure. Baseline cardiomyopathy with severe LVH raises concern for cardiac amyloidosis.  TEE 03/13 showed EF 55%, severe LVH with mild RV dysfunction, small IVC. CVP 7-8 today. Co-ox inaccurate.  Creatinine 2.38 => 2.28.  - Hold diuretics today.   - Increase hydralazine to 50 tid for HTN.  - He should have workup for cardiac amyloidosis, cardiac MRI before discharge.  4. Venous thromboembolism: Bilateral DVTs, mixed acute and chronic.  He is not anticoagulated at home.  With RV failure on echo, strong suspicion for PE.  - Unfortunately off anticoagulation with GI bleeding.  GI workup today, may need IVC filter if looks like we will be unable to anticoagulate.   - Eventual V/Q scan. No CTA with AKI.  5. CAD: NSTEMI with HS-TnI 3045.  ?Demand ischemia from arrest vs ACS.  He has history of carotid disease. - Plavix stopped with GI bleeding.  - Atorvastatin 80 daily.  - Avoid coronary angiography with no STEMI and AKI.  6. Carotid stenosis: h/o CVA.  TCAR in 6/24.  OK to stop Plavix at this point.  7. Atrial fibrillation: He was not on anticoagulation at home though has history of prior CVA.  Anticoagulation stopped due to prior GI bleeds. He went into AF/RVR here, tolerated poorly.  TEE/DCCV on  03/13.  Now appears to be in NSR with frequent atrial runs.  - Unfortunately off heparin gtt due to GI bleeding.  Would like to restart at least short-term anticoagulation, may need Watchman more long-term.  - Transition back to amiodarone gtt with atrial runs.  - Will get ECG.  8. AKI on CKD 3b: Suspect ATN in setting of hypotension/arrest.  Baseline Scr seems to be around 2.1. Creatinine 2.55 => 2.28 today. 9. ID: PCT 8.37, suspect PNA.   - Covering with ceftriaxone/doxycycline.  10. Neuro: Somewhat slowed post arrest. 11. GI bleeding: Hematochezia with anticoagulation.  Had this in the past with anticoagulation as well.  GI following.  Heparin gtt stopped, transfused yesterday with hgb 9.9 today.  Ideally, would restart at least short-term anticoagulation and then plan Watchman in the future.  - Continue PPI IV - EGD today.  - Restart heparin  gtt when GI deems ok.   Needs aggressive PT/OT.   Marca Ancona 07/01/2023 9:11 AM

## 2023-07-01 NOTE — Progress Notes (Signed)
   NAME:  Billy [name], MRN:  [id number], DOB:  Dec 04, 1950, LOS: 5 ADMISSION DATE:  07-08-2023, CONSULTATION DATE:  07-08-2023 REFERRING MD:  ED Physician, CHIEF COMPLAINT:  Cardiac Arrest   History of Present Illness:  73 y/o male with h/o CHF, CKD stage IV, Atrial Fibrillation, BPH, Anemia, carotid artery disease s/p endarterectomy and stenting, chornic rhinitis, HLD and RLS who went to his neighbors house today after having SOB for at least 1 week and had a cardiac arrest.  Neighbor stated CPR immediately and EMS continued .  He got ROSC back after about 15 min.  Then in the ED he arrested again for about 2-5 min and then achieved ROSC.  In the ED he was given 1 Epi and had chest compressions.   In the ED he was seen by Cardiology for possible STEMI, but felt as though he was not a a STEMI.  Pertinent  Medical History  Chronic kidney disease, stage 4 (severe)  Atherosclerosis of renal artery Unspecified atrial fibrillation  Personal history of other venous thrombosis and embolism Chronic systolic (congestive) heart failure  Significant Hospital Events: Including procedures, antibiotic start and stop dates in addition to other pertinent events   07/08/23: Post code in field, coded x 3 min in ED, CPR and 1 epinephrine given to achieve ROSC, intubated 3/11: following commands  3/12: extubated on [facility] 3/13:  Con't on 5LNC. He says he feels sob with laying flat.  Underwent TEE which was negative for LAA thrombus therefore patient was cardioverted to NSR complications 3/14 No acute issues overnight, remains in NSR  Interim History / Subjective:  More anxious and dyspneic this am.  Objective   Blood pressure (!) 198/88, pulse 89, temperature 98.8 F (37.1 C), resp. rate 19, height 6\' 1"  (1.854 m), weight 85.8 kg, SpO2 95%. CVP:  [4 mmHg-10 mmHg] 10 mmHg      Intake/Output Summary (Last 24 hours) at 07/01/2023 1009 Last data filed at 07/01/2023 0500 Gross per 24 hour  Intake 1797.22 ml  Output  2700 ml  Net -902.78 ml   Filed Weights   06/28/23 0400 06/29/23 0500 06/30/23 0500  Weight: 80.3 kg 84.1 kg 85.8 kg    Examination: Anxious mildly tachypneic Lungs clear ext warm Abd soft, nontender Trace edema Moves to command Aox3  Coox fine Cr a little better Na creeping up H/h stable after unit yesterday  Resolved Hospital Problem list   AGMA, resolved, resolved  Lactic acidosis, resolved   Assessment & Plan:  PEA cardiac arrest- seems to be primary arrhythmia; very thick LV + some RV failure; had DVTs as well so could have been precipitated by PE but not obstructive shock picture Atrial fibrillation with RVR, new- TEE cardioversion 3/13 to sinus with PACs Acute hypoxic respiratory failure- improved, lingering issue is more splinting and anxiety I think Acute kidney injury on chronic kidney disease - a little better, may be near baseline LE DVT Hematochezia on AC- hx of duodenal ulcers precluding AC in past 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

## 2023-07-01 NOTE — Consult Note (Deleted)
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 41.50 cm/s MV E/A ratio:  1.65 Arvilla Meres MD Electronically signed by Arvilla Meres MD Signature Date/Time: 06/27/2023/9:52:54 AM    Final    CT HEAD WO CONTRAST ( ) Result Date: 06/27/2023 CLINICAL DATA:  Mental status change with unknown cause. Cardiac arrest EXAM: CT HEAD WITHOUT CONTRAST TECHNIQUE: Contiguous axial images were obtained from the base of the skull through the vertex without intravenous contrast. RADIATION DOSE REDUCTION: This exam was performed according to the departmental dose-optimization program which includes automated exposure control, adjustment of the mA and/or kV according to patient size and/or use of iterative reconstruction technique. COMPARISON:  02/16/2022 FINDINGS: Brain: No evidence of acute infarction, hemorrhage, hydrocephalus, extra-axial collection or mass lesion/mass effect. Streak artifact from support hardware is a limiting factor. No evidence of global anoxic injury. Vascular: Calcification at the lower right sylvian fissure is  unchanged. No hyperdense vessel. Skull: Negative Sinuses/Orbits: Negative IMPRESSION: Stable from 2023.  No acute finding. Electronically  Signed   By: Tiburcio Pea M.D.   On: 06/27/2023 05:03   DG Chest Portable 1 View Result Date: 06/26/2023 CLINICAL DATA:  Central venous catheter placement EXAM: PORTABLE CHEST 1 VIEW COMPARISON:  8:26 p.m. FINDINGS: Left subclavian central venous catheter has been placed with its tip overlying the expected superior vena cava. Endotracheal tube is unchanged tip 4.5 cm above the carina. Nasogastric tube extends into the upper abdomen beyond the margin of the examination. Pulmonary insufflation is stable the lungs are well expanded. Small bilateral pleural effusions are suspected on this supine examination accounting for opacity at the lung apices bilaterally. No definite pneumothorax on this supine examination. Extensive diffuse airspace infiltrate with no avulsed caring of the costophrenic angles is again seen most in keeping with noncardiogenic pulmonary edema though extensive pneumonic infiltrate, diffuse pulmonary hemorrhage, or conditions such as alveolar proteinosis could appear similarly. Cardiac size within normal limits. Implanted loop recorder again noted. IMPRESSION: 1. Interval placement of left subclavian central venous catheter with its tip overlying the expected superior vena cava. No definite pneumothorax on this supine examination. 2. Stable extensive diffuse airspace infiltrate most in keeping with noncardiogenic pulmonary edema though extensive pneumonic infiltrate, diffuse pulmonary hemorrhage, or conditions such as alveolar proteinosis could appear similarly. Electronically Signed   By: Helyn Numbers M.D.   On: 06/26/2023 23:31   DG Abdomen 1 View Result Date: 06/26/2023 CLINICAL DATA:  Orogastric tube placement EXAM: ABDOMEN - 1 VIEW COMPARISON:  None Available. FINDINGS: Orogastric tube tip overlies the mid body of the stomach. Nonobstructive bowel  gas pattern. No free intraperitoneal gas. Bilateral pleural effusions and extensive diffuse airspace infiltrate again noted within the visualized lungs. IMPRESSION: 1. Orogastric tube tip within the mid body of the stomach. Electronically Signed   By: Helyn Numbers M.D.   On: 06/26/2023 23:27   DG Chest Portable 1 View Result Date: 06/26/2023 CLINICAL DATA:  Status post cpr.  Intubation EXAM: PORTABLE CHEST 1 VIEW COMPARISON:  CT angio neck 02/16/2022, chest x-ray 03/09/2023, chest x-ray 04/11/2018 FINDINGS: Endotracheal tube with tip terminating 4.5 cm above the carina. Cardiac paddles overlie the chest. The heart and mediastinal contours are unchanged. Atherosclerotic plaque. Diffuse bat wing perihilar interstitial and airspace opacities. at least trace to small left pleural effusion. Right costophrenic angle collimated off view. Question trace apical right hydropneumothorax with associated calcification along the expected pleural line. No left pneumothorax. No acute osseous abnormality. IMPRESSION: 1. Question trace apical right hydropneumothorax. Recommend attention on follow-up. 2. Findings suggestive of ARDS. Superimposed infection not excluded. 3. At least trace to small left pleural effusion. Electronically Signed   By: Tish Frederickson M.D.   On: 06/26/2023 22:57    Labs:  CBC: Recent Labs    06/30/23 0438 06/30/23 1717 07/01/23 0500 07/01/23 1557  WBC 14.9* 17.7* 17.7* 16.3*  HGB 7.9* 9.7* 9.9* 9.8*  HCT 24.3* 30.3* 31.7* 31.5*  PLT 215 209 195 172    COAGS: Recent Labs    07/22/22 1400 10/04/22 1050 03/11/23 0218  INR 1.2 1.2 1.3*  APTT 33 34 39*    BMP: Recent Labs    06/29/23 1849 06/30/23 0438 06/30/23 1717 07/01/23 0500  NA 140 142 144 146*  K 3.1* 3.1* 3.6 3.7  CL 106 110 110 114*  CO2 21* 20* 20* 22  GLUCOSE 124* 122* 103* 108*  BUN 55* 57* 53* 51*  CALCIUM 7.5* 7.6* 7.7* 8.1*  CREATININE 2.62* 2.55* 2.38* 2.28*  GFRNONAA 25* 26* 29* 30*  LIVER  FUNCTION TESTS: Recent Labs    07/22/22 1400 10/04/22 1050 06/27/23 0333  BILITOT 0.6 0.7 0.7  AST 23 24 78*  ALT 18 18 57*  ALKPHOS 56 44 63  PROT 6.7 6.4* 4.5*  ALBUMIN 3.6 3.6 1.7*    TUMOR MARKERS: No results for input(s): "AFPTM", "CEA", "CA199", "CHROMGRNA" in the last 8760 hours.  Assessment and Plan: 73 y.o. male with PMH sig for anemia, PAF, HLD, lower GI bleed 2019, LE DVT/?PE, HTN, CKD, CAD with prior MI, TIA, CHF who was admitted to [facility] on 3/10 after witnessed cardiac arrest. He was found to have refractory V-fib and subsequently diagnosed with NSTEMI. He was also found to have extensive bilateral DVT,  started on aspirin and Plavix and  subsequently put on heparin drip.  He started having rectal bleeding 3/13. Marland Kitchen Heparin drip was stopped but he continued to have intermittent rectal bleeding. Pt underwent EGD today which revealed:  Normal esophagus.                           - Erosive gastropathy with no bleeding and no                            stigmata of recent bleeding.                           - Normal gastric fundus, gastric body, incisura and                            antrum.                           - Duodenal deformity.                           - Bleeding friable duodenal mucosa. Hemostatic                            spray applied.  He is afebrile, WBC 16.3, hgb 9.8, plts nl, creat 2.28, PT/INR pend; request now received from CCM for IVC filter placement.Case reviewed by Dr. Fredia Sorrow.Risks and benefits discussed with the patient/sister  including, but not limited to bleeding, infection, contrast induced renal failure, filter fracture or migration which can lead to emergency surgery or even death, strut penetration with damage or irritation to adjacent structures and caval thrombosis.  All of the patient's questions were answered, patient is agreeable to proceed. Consent signed and in chart.  Procedure tent scheduled for 3/16   Thank you for allowing our  service to participate in Billy [name] 's care.  Electronically Signed: D. Jeananne Rama, PA-C   07/01/2023, 5:19 PM      I spent a total of 40 Minutes    in face to face in clinical consultation, greater than 50% of which was counseling/coordinating care for inferior vena cava filter placement

## 2023-07-02 ENCOUNTER — Inpatient Hospital Stay (HOSPITAL_COMMUNITY)

## 2023-07-02 DIAGNOSIS — I4891 Unspecified atrial fibrillation: Secondary | ICD-10-CM | POA: Diagnosis not present

## 2023-07-02 DIAGNOSIS — I469 Cardiac arrest, cause unspecified: Secondary | ICD-10-CM | POA: Diagnosis not present

## 2023-07-02 DIAGNOSIS — I214 Non-ST elevation (NSTEMI) myocardial infarction: Secondary | ICD-10-CM | POA: Diagnosis not present

## 2023-07-02 DIAGNOSIS — R570 Cardiogenic shock: Secondary | ICD-10-CM | POA: Diagnosis not present

## 2023-07-02 HISTORY — PX: IR IVC FILTER PLMT / S&I /IMG GUID/MOD SED: IMG701

## 2023-07-02 LAB — BASIC METABOLIC PANEL
Anion gap: 14 (ref 5–15)
BUN: 51 mg/dL — ABNORMAL HIGH (ref 8–23)
CO2: 21 mmol/L — ABNORMAL LOW (ref 22–32)
Calcium: 8.5 mg/dL — ABNORMAL LOW (ref 8.9–10.3)
Chloride: 114 mmol/L — ABNORMAL HIGH (ref 98–111)
Creatinine, Ser: 2.05 mg/dL — ABNORMAL HIGH (ref 0.61–1.24)
GFR, Estimated: 34 mL/min — ABNORMAL LOW (ref >60)
Glucose, Bld: 159 mg/dL — ABNORMAL HIGH (ref 70–99)
Potassium: 3.7 mmol/L (ref 3.5–5.1)
Sodium: 149 mmol/L — ABNORMAL HIGH (ref 135–145)

## 2023-07-02 LAB — PROTIME-INR
INR: 1.3 — ABNORMAL HIGH (ref 0.8–1.2)
Prothrombin Time: 16.6 s — ABNORMAL HIGH (ref 11.4–15.2)

## 2023-07-02 LAB — GLUCOSE, CAPILLARY
Glucose-Capillary: 130 mg/dL — ABNORMAL HIGH (ref 70–99)
Glucose-Capillary: 119 mg/dL — ABNORMAL HIGH (ref 70–99)
Glucose-Capillary: 136 mg/dL — ABNORMAL HIGH (ref 70–99)
Glucose-Capillary: 110 mg/dL — ABNORMAL HIGH (ref 70–99)
Glucose-Capillary: 125 mg/dL — ABNORMAL HIGH (ref 70–99)
Glucose-Capillary: 139 mg/dL — ABNORMAL HIGH (ref 70–99)

## 2023-07-02 LAB — COOXEMETRY PANEL
Carboxyhemoglobin: 2.3 % — ABNORMAL HIGH (ref 0.5–1.5)
Methemoglobin: 1.3 % (ref 0.0–1.5)
O2 Saturation: 77.6 %
Total hemoglobin: 10.4 g/dL — ABNORMAL LOW (ref 12.0–16.0)

## 2023-07-02 LAB — CBC
HCT: 33.2 % — ABNORMAL LOW (ref 39.0–52.0)
Hemoglobin: 10.3 g/dL — ABNORMAL LOW (ref 13.0–17.0)
MCH: 24.6 pg — ABNORMAL LOW (ref 26.0–34.0)
MCHC: 31 g/dL (ref 30.0–36.0)
MCV: 79.4 fL — ABNORMAL LOW (ref 80.0–100.0)
Platelets: 193 10*3/uL (ref 150–400)
RBC: 4.18 MIL/uL — ABNORMAL LOW (ref 4.22–5.81)
RDW: 17.8 % — ABNORMAL HIGH (ref 11.5–15.5)
WBC: 16.8 10*3/uL — ABNORMAL HIGH (ref 4.0–10.5)
nRBC: 1 % — ABNORMAL HIGH (ref 0.0–0.2)

## 2023-07-02 LAB — MAGNESIUM: Magnesium: 2.2 mg/dL (ref 1.7–2.4)

## 2023-07-02 MED ORDER — FENTANYL CITRATE (PF) 100 MCG/2ML IJ SOLN
INTRAMUSCULAR | Status: AC | PRN
  Administered 2023-07-02: 50 ug via INTRAVENOUS

## 2023-07-02 MED ORDER — HYDRALAZINE HCL 50 MG PO TABS
75.0000 mg | ORAL_TABLET | Freq: Three times a day (TID) | ORAL | Status: DC
  Administered 2023-07-03: 75 mg via ORAL
  Filled 2023-07-02: qty 1

## 2023-07-02 MED ORDER — HYDROMORPHONE HCL 1 MG/ML IJ SOLN
1.0000 mg | INTRAMUSCULAR | Status: DC | PRN
  Administered 2023-07-02 – 2023-07-10 (×36): 1 mg via INTRAVENOUS
  Filled 2023-07-02 (×40): qty 1

## 2023-07-02 MED ORDER — PROCHLORPERAZINE EDISYLATE 10 MG/2ML IJ SOLN
10.0000 mg | INTRAMUSCULAR | Status: AC | PRN
  Administered 2023-07-02 (×2): 10 mg via INTRAVENOUS
  Filled 2023-07-02 (×3): qty 2

## 2023-07-02 MED ORDER — POTASSIUM CHLORIDE 10 MEQ/50ML IV SOLN
10.0000 meq | INTRAVENOUS | Status: AC
  Administered 2023-07-02 (×2): 10 meq via INTRAVENOUS
  Filled 2023-07-02 (×2): qty 50

## 2023-07-02 MED ORDER — IOHEXOL 300 MG/ML  SOLN: 100.0000 mL | Freq: Once | INTRAMUSCULAR | Status: DC | PRN

## 2023-07-02 MED ORDER — METHOCARBAMOL 1000 MG/10ML IJ SOLN
1000.0000 mg | Freq: Three times a day (TID) | INTRAMUSCULAR | Status: AC
  Administered 2023-07-02 – 2023-07-04 (×6): 1000 mg via INTRAVENOUS
  Filled 2023-07-02 (×6): qty 10

## 2023-07-02 MED ORDER — LIDOCAINE HCL 1 % IJ SOLN
INTRAMUSCULAR | Status: AC
  Filled 2023-07-02: qty 20

## 2023-07-02 MED ORDER — IOHEXOL 300 MG/ML  SOLN
50.0000 mL | Freq: Once | INTRAMUSCULAR | Status: DC | PRN
Start: 2023-07-02 — End: 2023-07-05

## 2023-07-02 MED ORDER — FENTANYL CITRATE (PF) 100 MCG/2ML IJ SOLN
INTRAMUSCULAR | Status: AC
  Filled 2023-07-02: qty 2

## 2023-07-02 MED ORDER — DEXTROSE-SODIUM CHLORIDE 5-0.45 % IV SOLN: INTRAVENOUS | Status: AC

## 2023-07-02 MED ORDER — AMIODARONE LOAD VIA INFUSION
150.0000 mg | Freq: Once | INTRAVENOUS | Status: AC
  Administered 2023-07-02: 150 mg via INTRAVENOUS

## 2023-07-02 MED ORDER — MIDAZOLAM HCL 2 MG/2ML IJ SOLN
INTRAMUSCULAR | Status: AC
  Filled 2023-07-02: qty 2

## 2023-07-02 MED ORDER — AMIODARONE HCL IN DEXTROSE 360-4.14 MG/200ML-% IV SOLN
30.0000 mg/h | INTRAVENOUS | Status: DC
  Administered 2023-07-02 – 2023-07-04 (×14): 60 mg/h via INTRAVENOUS
  Administered 2023-07-05: 30 mg/h via INTRAVENOUS
  Administered 2023-07-05: 60 mg/h via INTRAVENOUS
  Administered 2023-07-06 – 2023-07-11 (×13): 30 mg/h via INTRAVENOUS
  Filled 2023-07-02 (×4): qty 200
  Filled 2023-07-02: qty 400
  Filled 2023-07-02 (×4): qty 200
  Filled 2023-07-02: qty 400
  Filled 2023-07-02 (×14): qty 200
  Filled 2023-07-02: qty 400
  Filled 2023-07-02 (×2): qty 200

## 2023-07-02 MED ORDER — MIDAZOLAM HCL 2 MG/2ML IJ SOLN
INTRAMUSCULAR | Status: AC | PRN
  Administered 2023-07-02: 1 mg via INTRAVENOUS

## 2023-07-02 MED ORDER — FUROSEMIDE 10 MG/ML IJ SOLN
40.0000 mg | Freq: Once | INTRAMUSCULAR | Status: AC
  Administered 2023-07-02: 40 mg via INTRAVENOUS
  Filled 2023-07-02: qty 4

## 2023-07-02 NOTE — Progress Notes (Signed)
 eLink Physician-Brief Progress Note Patient Name: Billy [name] DOB: 1951/03/14 MRN: [id number]   Date of Service  07/02/2023  HPI/Events of Note  BSRN adjusted amio down to 30 as ordered around 11pm, last 10 minutes HR with afib got up to 150, currently 127- 130's,  178/92 ECG being done to see if zofran can be given but RN asking if he needs to be bolused or just increase amio back up to 60  Patient seen awake, not is acute distress  eICU Interventions  Increase amiodarone back up to 60 HR and BP may improve once Zofran given for nausea     Intervention Category Intermediate Interventions: Arrhythmia - evaluation and management  Darl Pikes 07/02/2023, 1:10 AM

## 2023-07-02 NOTE — Progress Notes (Signed)
 Patient ID: Billy [name], male   DOB: February 22, 1951, 73 y.o.   MRN: [id number]     Advanced Heart Failure Rounding Note  Cardiologist: Nanetta Batty, MD   Chief Complaint: RV failure  Subjective:    In and out of atrial fibrillation, currently in NSR on amiodarone 60 mg/hr.  Off heparin with GI bleeding.   EGD (3/15) showed bleeding friable duodenal mucosa treated with hemostatic spray. No overt GI bleeding today, hgb 10.3.   Co-ox 78%, no pressors/inotropes.  BP elevated.   CVP 8-9 this morning.  Afebrile, WBCs 17.7 => 16.8.  He completes ceftriaxone/doxycycline today.   Objective:    Weight Range: 83.7 kg Body mass index is 24.35 kg/m.   Vital Signs: Temp:  [96.4 F (35.8 C)-99.1 F (37.3 C)] 98.1 F (36.7 C) (03/16 0800) Pulse Rate:  [72-148] 81 (03/16 0800) Resp:  [14-33] 23 (03/16 0800) BP: (129-196)/(72-123) 171/78 (03/16 0800) SpO2:  [88 %-98 %] 93 % (03/16 0800) Weight:  [83.7 kg] 83.7 kg (03/16 0407) Last BM Date : 06/30/23  Weight change: Filed Weights   06/29/23 0500 06/30/23 0500 07/02/23 0407  Weight: 84.1 kg 85.8 kg 83.7 kg   Intake/Output:  Intake/Output Summary (Last 24 hours) at 07/02/2023 0847 Last data filed at 07/02/2023 0800 Gross per 24 hour  Intake 2136.54 ml  Output 1750 ml  Net 386.54 ml    Physical Exam    General: NAD Neck: JVP 8, no thyromegaly or thyroid nodule.  Lungs: Clear to auscultation bilaterally with normal respiratory effort. CV: Nondisplaced PMI.  Heart regular S1/S2, no S3/S4, no murmur.  1+ edema to knees.  Abdomen: Soft, nontender, no hepatosplenomegaly, no distention.  Skin: Intact without lesions or rashes.  Neurologic: Alert and oriented x 3.  Psych: Normal affect. Extremities: No clubbing or cyanosis.  HEENT: Normal.   Telemetry   NSR currently but has runs of AF (personally reviewed)  EKG    N/A  Labs    CBC Recent Labs    07/01/23 1557 07/02/23 0136  WBC 16.3* 16.8*  HGB 9.8* 10.3*  HCT 31.5*  33.2*  MCV 79.3* 79.4*  PLT 172 193   Basic Metabolic Panel Recent Labs    82/95/62 0438 06/30/23 1717 07/01/23 0500 07/02/23 0136  NA 142   < > 146* 149*  K 3.1*   < > 3.7 3.7  CL 110   < > 114* 114*  CO2 20*   < > 22 21*  GLUCOSE 122*   < > 108* 159*  BUN 57*   < > 51* 51*  CREATININE 2.55*   < > 2.28* 2.05*  CALCIUM 7.6*   < > 8.1* 8.5*  MG 2.0  --  2.1 2.2  PHOS 3.0  --  3.1  --    < > = values in this interval not displayed.   Liver Function Tests No results for input(s): "AST", "ALT", "ALKPHOS", "BILITOT", "PROT", "ALBUMIN" in the last 72 hours.  No results for input(s): "LIPASE", "AMYLASE" in the last 72 hours. Cardiac Enzymes No results for input(s): "CKTOTAL", "CKMB", "CKMBINDEX", "TROPONINI" in the last 72 hours.  BNP: BNP (last 3 results) Recent Labs    03/09/23 1544 03/11/23 0218 06/27/23 1116  BNP 660.9* 491.3* 646.4*    Imaging   No results found.  Medications:    Scheduled Medications:  atorvastatin  80 mg Oral Daily   Chlorhexidine Gluconate Cloth  6 each Topical Daily   ezetimibe  10 mg Oral Daily  feeding supplement  237 mL Oral TID BM   hydrALAZINE  75 mg Oral Q8H   insulin aspart  0-9 Units Subcutaneous Q4H   pantoprazole (PROTONIX) IV  40 mg Intravenous Q12H   sertraline  50 mg Oral QHS   sodium chloride flush  10-40 mL Intracatheter Q12H   Infusions:  amiodarone 60 mg/hr (07/02/23 0700)   dextrose 5 % and 0.45 % NaCl 50 mL/hr at 07/02/23 0800    PRN Medications: docusate sodium, hydrALAZINE, HYDROmorphone (DILAUDID) injection, influenza vaccine adjuvanted, labetalol, methocarbamol (ROBAXIN) injection, mouth rinse, pneumococcal 20-valent conjugate vaccine, polyethylene glycol, sodium chloride flush  Patient Profile   Billy [name] is a 73 yo male with PMH of HTN, HLD, prior DVT, CVA, paroxysmal atrial fibrillation, CKD stage IIIb, carotid artery disease s/p endarterectomy, and chronic diastolic CHF. Admitted following out of  hospital witnessed cardiac arrest.   Assessment/Plan   1. Cardiac arrest: Witnessed arrest with bystander then EMS CPR.  >20 minutes CPR.  He was shocked for VF.  Had additional 4-5 minutes PEA arrest in ER. ?PE with aspiration given bilateral DVTs.  - He is on amiodarone. 2. Cardiogenic shock: Initial echo showed EF 45%, diffuse hypokinesis, severe LVH, normal RV size with moderately decreased systolic function, small pericardial effusion, dilated IVC.  Suspicion for PE as above.  PNA on CXR, initial hypotension thought to be due to septic shock.  He is now off pressors.  3. Acute HF with mid range EF: With prominent RV failure. Initial echo showed EF 45%, diffuse hypokinesis, severe LVH, normal RV size with moderately decreased systolic function, small pericardial effusion, dilated IVC. As above, worry for PE with RV failure. Baseline cardiomyopathy with severe LVH raises concern for cardiac amyloidosis.  TEE 03/13 showed EF 55%, severe LVH with mild RV dysfunction, small IVC. CVP 8-9 today. Co-ox 78%.  Creatinine 2.38 => 2.28 => 2.05.  - Lasix 40 mg IV x 1 today.    - Increase hydralazine to 75 tid for HTN.  - He should have workup for cardiac amyloidosis, cardiac MRI before discharge.  4. Venous thromboembolism: Bilateral DVTs, mixed acute and chronic.  He is not anticoagulated at home.  With RV failure on echo, strong suspicion for PE.  - Unfortunately off anticoagulation with GI bleeding.  Plan for IVC filter today by IR.   - Eventual V/Q scan. No CTA with AKI.  5. CAD: NSTEMI with HS-TnI 3045.  ?Demand ischemia from arrest vs ACS.  He has history of carotid disease. - Plavix stopped with GI bleeding.  - Atorvastatin 80 daily.  - Avoid coronary angiography with no STEMI and AKI.  6. Carotid stenosis: h/o CVA.  TCAR in 6/24.  OK to stop Plavix at this point (now off).  7. Atrial fibrillation: He was not on anticoagulation at home though has history of prior CVA.  Anticoagulation stopped due  to prior GI bleeds. He went into AF/RVR here, tolerated poorly.  TEE/DCCV on 03/13.  Now appears to be in NSR primarily but still having runs of AF.  - Unfortunately off heparin gtt due to GI bleeding.  Would like to restart at least short-term anticoagulation with recent cardioversion, may need Watchman more long-term. Will try to restart heparin tomorrow after IVC filter placed today.  No further overt GI bleeding.  - Continue amiodarone gtt 60 mg/hr for now for further loading with frequent breakthrough AF.   8. AKI on CKD 3b: Suspect ATN in setting of hypotension/arrest.  Baseline Scr seems to  be around 2.1. Creatinine 2.55 => 2.28 => 2.05 today. 9. ID: PCT 8.37, suspect PNA.   - He has completed course of ceftriaxone/doxycycline.  10. Neuro: Somewhat slowed post arrest. 11. GI bleeding: Hematochezia with anticoagulation.  Had this in the past with anticoagulation as well.  GI following.  Heparin gtt stopped, has had transfusion here. EGD showed bleeding friable duodenal mucosa treated with hemostatic spray.  Ideally, would restart at least short-term anticoagulation and then plan Watchman in the future.  - Continue PPI IV - Plan to rechallenge tomorrow with heparin gtt.    Needs aggressive PT/OT.   Marca Ancona 07/02/2023 8:47 AM

## 2023-07-02 NOTE — Progress Notes (Signed)
 NAME:  Billy [name], MRN:  [id number], DOB:  07-04-1950, LOS: 6 ADMISSION DATE:  07/07/2023, CONSULTATION DATE:  07/07/2023 REFERRING MD:  ED Physician, CHIEF COMPLAINT:  Cardiac Arrest   History of Present Illness:  73 y/o male with h/o CHF, CKD stage IV, Atrial Fibrillation, BPH, Anemia, carotid artery disease s/p endarterectomy and stenting, chornic rhinitis, HLD and RLS who went to his neighbors house today after having SOB for at least 1 week and had a cardiac arrest.  Neighbor stated CPR immediately and EMS continued .  He got ROSC back after about 15 min.  Then in the ED he arrested again for about 2-5 min and then achieved ROSC.  In the ED he was given 1 Epi and had chest compressions.   In the ED he was seen by Cardiology for possible STEMI, but felt as though he was not a a STEMI.  Pertinent  Medical History  Chronic kidney disease, stage 4 (severe)  Atherosclerosis of renal artery Unspecified atrial fibrillation  Personal history of other venous thrombosis and embolism Chronic systolic (congestive) heart failure  Significant Hospital Events: Including procedures, antibiotic start and stop dates in addition to other pertinent events   2023/07/07: Post code in field, coded x 3 min in ED, CPR and 1 epinephrine given to achieve ROSC, intubated 3/11: following commands  3/12: [facility]ated [facility] 3/13:  Con't on 5LNC. He says he feels sob with laying flat.  Underwent TEE which was negative for LAA thrombus therefore patient was cardioverted to NSR complications 3/14 No acute issues overnight, remains in NSR; developed BRBPR; heparin stopped 3/15 EGD: duodenitis, ongoing BRBPR but H/H stable 3/16 for IVC filter  Interim History / Subjective:  Still having discomfort from chest compressions.  Objective   Blood pressure (!) 171/78, pulse 81, temperature 98.1 F (36.7 C), temperature source Bladder, resp. rate (!) 23, height 6\' 1"  (1.854 m), weight 83.7 kg, SpO2 93%. CVP:  [2 mmHg-12 mmHg] 10  mmHg      Intake/Output Summary (Last 24 hours) at 07/02/2023 1039 Last data filed at 07/02/2023 1008 Gross per 24 hour  Intake 2313.19 ml  Output 1825 ml  Net 488.19 ml   Filed Weights   06/29/23 0500 06/30/23 0500 07/02/23 0407  Weight: 84.1 kg 85.8 kg 83.7 kg    Examination: No distress Anxious, slow to answer questions (similar to yesterday) +chest wall pain to palpation Globally weak Abd soft No further BRBPR  Resolved Hospital Problem list   AGMA, resolved, resolved  Lactic acidosis, resolved   Assessment & Plan:  PEA cardiac arrest- seems to be primary arrhythmia; very thick LV + some RV failure; had DVTs as well so could have been precipitated by PE but not obstructive shock picture Atrial fibrillation with RVR, new- TEE cardioversion 3/13 to sinus with PACs Acute hypoxic respiratory failure- improved, lingering issue is more splinting and anxiety I think Acute kidney injury on chronic kidney disease - a little better, may be near baseline LE DVT Hematochezia on AC- duodenitis on EGD; [facility] does not think this is problem and may be lower; not candidate for colonoscopy at present; consider CT GIB if opens up again Post arrest anxiety/PTSD, chest wall pain- not unexpected  - Continue PPI - IVC filter today - Rechallenge with [facility] tomorrow if rebleeds consider CT GIB if kidneys okay; at some point if stronger would benefit from colonoscopy - Getting diuresis - IS, pain control as ordered: doubled dilaudid and added robaxin - sertraline started 3/15 -  GDMT, eventual HC and cardiac MRI per cards team - PT/OT - Progressive today, appreciate [name] taking over starting 3/17  Myrla Halsted MD PCCM

## 2023-07-02 NOTE — Progress Notes (Signed)
 eLink Physician-Brief Progress Note Patient Name: Billy [name] DOB: Jan 09, 1951 MRN: [id number]   Date of Service  07/02/2023  HPI/Events of Note  Labs back K 3.7, mg 2.2, creatinine 2.05 from 2.28. Notable is Na 149 from 146 Qtc 510 hence unable to give Zofran. Nauseated when he wakes up Notified of light colored blood from rectum. Hgb 10.3 Still in afib RVR 120s  eICU Interventions  Zofran shifted to compazine Kcl ordered 10 meqs x 2 doses Start D5 0.45% for hypernatremia Discussed with BSRN     Intervention Category Intermediate Interventions: Electrolyte abnormality - evaluation and management;Arrhythmia - evaluation and management  Darl Pikes 07/02/2023, 2:43 AM

## 2023-07-02 NOTE — Procedures (Signed)
 Interventional Radiology Procedure Note  Procedure: IVC filter placement  Complications: None  Estimated Blood Loss: None  Findings: IVC filter placed via right internal jugular vein approach. IVC venogram performed with CO2.  [facility] retrievable filter placed in infrarenal IVC.  Jodi Marble. Fredia Sorrow, M.D Pager:  [phone]
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
## 2023-07-02 NOTE — Progress Notes (Signed)
 eLink Physician-Brief Progress Note Patient Name: Billy [name] DOB: [birth year] MRN: [id number]   Date of Service  07/02/2023  HPI/Events of Note  Had a run of VT. No still in afib RVR 130s Amiodarone back up to 60  eICU Interventions  Ordered amiodarone 150 mg IV bolus Electrolytes to be checked early     Intervention Category Intermediate Interventions: Arrhythmia - evaluation and management  Darl Pikes 07/02/2023, 1:34 AM

## 2023-07-02 NOTE — Progress Notes (Signed)
 Subjective: He states he is hungry. Complains of pain in his chest area.   Objective: Vital signs in last 24 hours: Temp:  [96.4 F (35.8 C)-99.1 F (37.3 C)] 98.1 F (36.7 C) (03/16 0800) Pulse Rate:  [72-148] 81 (03/16 0800) Resp:  [14-33] 23 (03/16 0800) BP: (129-196)/(72-123) 171/78 (03/16 0800) SpO2:  [88 %-98 %] 93 % (03/16 0800) Weight:  [83.7 kg] 83.7 kg (03/16 0407) Weight change:  Last BM Date : 06/30/23  PE: Appears weak GENERAL: Awake, oriented x 3  ABDOMEN: Soft, nondistended, nontender EXTREMITIES: No deformity  Lab Results: Results for orders placed or performed during the hospital encounter of 06/26/23 (from the past 48 hours)  Glucose, capillary     Status: Abnormal   Collection Time: 06/30/23 11:16 AM  Result Value Ref Range   Glucose-Capillary 123 (H) 70 - 99 mg/dL    Comment: Glucose reference range applies only to samples taken after fasting for at least 8 hours.  Prepare RBC (crossmatch)     Status: None   Collection Time: 06/30/23 12:28 PM  Result Value Ref Range   Order Confirmation      ORDER PROCESSED BY BLOOD BANK Performed at [facility], [address], [place], [place] [address]   Glucose, capillary     Status: None   Collection Time: 06/30/23  3:51 PM  Result Value Ref Range   Glucose-Capillary 97 70 - 99 mg/dL    Comment: Glucose reference range applies only to samples taken after fasting for at least 8 hours.  Basic metabolic panel     Status: Abnormal   Collection Time: 06/30/23  5:17 PM  Result Value Ref Range   Sodium 144 135 - 145 mmol/L   Potassium 3.6 3.5 - 5.1 mmol/L   Chloride 110 98 - 111 mmol/L   CO2 20 (L) 22 - 32 mmol/L   Glucose, Bld 103 (H) 70 - 99 mg/dL    Comment: Glucose reference range applies only to samples taken after fasting for at least 8 hours.   BUN 53 (H) 8 - 23 mg/dL   Creatinine, Ser 2.84 (H) 0.61 - 1.24 mg/dL   Calcium 7.7 (L) 8.9 - 10.3 mg/dL   GFR, Estimated 29 (L) >60 mL/min    Comment:  (NOTE) Calculated using the CKD-EPI Creatinine Equation (2021)    Anion gap 14 5 - 15    Comment: Performed at [facility], [address] [place], [place] [address]  CBC     Status: Abnormal   Collection Time: 06/30/23  5:17 PM  Result Value Ref Range   WBC 17.7 (H) 4.0 - 10.5 K/uL   RBC 3.87 (L) 4.22 - 5.81 MIL/uL   Hemoglobin 9.7 (L) 13.0 - 17.0 g/dL   HCT 01.0 (L) 27.2 - 53.6 %   MCV 78.3 (L) 80.0 - 100.0 fL   MCH 25.1 (L) 26.0 - 34.0 pg   MCHC 32.0 30.0 - 36.0 g/dL   RDW 64.4 (H) 03.4 - 74.2 %   Platelets 209 150 - 400 K/uL   nRBC 0.8 (H) 0.0 - 0.2 %    Comment: Performed at [facility], [address], [place], [place] [address]  Glucose, capillary     Status: Abnormal   Collection Time: 06/30/23  8:05 PM  Result Value Ref Range   Glucose-Capillary 100 (H) 70 - 99 mg/dL    Comment: Glucose reference range applies only to samples taken after fasting for at least 8  hours.  Glucose, capillary     Status: None   Collection Time: 06/30/23 11:50 PM  Result Value Ref Range   Glucose-Capillary 92 70 - 99 mg/dL    Comment: Glucose reference range applies only to samples taken after fasting for at least 8 hours.  Glucose, capillary     Status: None   Collection Time: 07/01/23  3:52 AM  Result Value Ref Range   Glucose-Capillary 97 70 - 99 mg/dL    Comment: Glucose reference range applies only to samples taken after fasting for at least 8 hours.  CBC     Status: Abnormal   Collection Time: 07/01/23  5:00 AM  Result Value Ref Range   WBC 17.7 (H) 4.0 - 10.5 K/uL   RBC 4.06 (L) 4.22 - 5.81 MIL/uL   Hemoglobin 9.9 (L) 13.0 - 17.0 g/dL   HCT 36.6 (L) 44.0 - 34.7 %   MCV 78.1 (L) 80.0 - 100.0 fL   MCH 24.4 (L) 26.0 - 34.0 pg   MCHC 31.2 30.0 - 36.0 g/dL   RDW 42.5 (H) 95.6 - 38.7 %   Platelets 195 150 - 400 K/uL   nRBC 0.6 (H) 0.0 - 0.2 %    Comment: Performed at [facility], [address], [place], [place] [address]  Magnesium     Status: None    Collection Time: 07/01/23  5:00 AM  Result Value Ref Range   Magnesium 2.1 1.7 - 2.4 mg/dL    Comment: Performed at [facility], [address], [place], [place] [address]  Phosphorus     Status: None   Collection Time: 07/01/23  5:00 AM  Result Value Ref Range   Phosphorus 3.1 2.5 - 4.6 mg/dL    Comment: Performed at [facility], [address], [place], [place] [address]  Basic metabolic panel     Status: Abnormal   Collection Time: 07/01/23  5:00 AM  Result Value Ref Range   Sodium 146 (H) 135 - 145 mmol/L   Potassium 3.7 3.5 - 5.1 mmol/L   Chloride 114 (H) 98 - 111 mmol/L   CO2 22 22 - 32 mmol/L   Glucose, Bld 108 (H) 70 - 99 mg/dL    Comment: Glucose reference range applies only to samples taken after fasting for at least 8 hours.   BUN 51 (H) 8 - 23 mg/dL   Creatinine, Ser 0.63 (H) 0.61 - 1.24 mg/dL   Calcium 8.1 (L) 8.9 - 10.3 mg/dL   GFR, Estimated 30 (L) >60 mL/min    Comment: (NOTE) Calculated using the CKD-EPI Creatinine Equation (2021)    Anion gap 10 5 - 15    Comment: Performed at [facility], [address] [place], [place] [address]  Cooxemetry Panel (carboxy, met, total hgb, O2 sat)     Status: Abnormal   Collection Time: 07/01/23  5:26 AM  Result Value Ref Range   Total hemoglobin 10.4 (L) 12.0 - 16.0 g/dL   O2 Saturation 09.3 %   Carboxyhemoglobin 2.3 (H) 0.5 - 1.5 %   Methemoglobin <0.7 0.0 - 1.5 %    Comment: Performed at [facility], [address] [place], [place] [address]  Glucose, capillary     Status: Abnormal   Collection Time: 07/01/23  7:25 AM  Result Value Ref Range   Glucose-Capillary 105 (H) 70 - 99 mg/dL    Comment: Glucose reference range applies only to samples taken after fasting  for at least 8 hours.  Glucose, capillary     Status: Abnormal   Collection Time: 07/01/23 11:12 AM  Result Value Ref Range   Glucose-Capillary 102 (H) 70 - 99 mg/dL    Comment: Glucose reference range applies only to samples taken  after fasting for at least 8 hours.  CBC     Status: Abnormal   Collection Time: 07/01/23  3:57 PM  Result Value Ref Range   WBC 16.3 (H) 4.0 - 10.5 K/uL   RBC 3.97 (L) 4.22 - 5.81 MIL/uL   Hemoglobin 9.8 (L) 13.0 - 17.0 g/dL   HCT 16.1 (L) 09.6 - 04.5 %   MCV 79.3 (L) 80.0 - 100.0 fL   MCH 24.7 (L) 26.0 - 34.0 pg   MCHC 31.1 30.0 - 36.0 g/dL   RDW 40.9 (H) 81.1 - 91.4 %   Platelets 172 150 - 400 K/uL   nRBC 1.2 (H) 0.0 - 0.2 %    Comment: Performed at [facility], [address] [place], [place] [address]  Glucose, capillary     Status: Abnormal   Collection Time: 07/01/23  4:00 PM  Result Value Ref Range   Glucose-Capillary 106 (H) 70 - 99 mg/dL    Comment: Glucose reference range applies only to samples taken after fasting for at least 8 hours.  Glucose, capillary     Status: None   Collection Time: 07/01/23  7:40 PM  Result Value Ref Range   Glucose-Capillary 90 70 - 99 mg/dL    Comment: Glucose reference range applies only to samples taken after fasting for at least 8 hours.  Glucose, capillary     Status: Abnormal   Collection Time: 07/01/23 11:20 PM  Result Value Ref Range   Glucose-Capillary 111 (H) 70 - 99 mg/dL    Comment: Glucose reference range applies only to samples taken after fasting for at least 8 hours.  Basic metabolic panel     Status: Abnormal   Collection Time: 07/02/23  1:36 AM  Result Value Ref Range   Sodium 149 (H) 135 - 145 mmol/L   Potassium 3.7 3.5 - 5.1 mmol/L   Chloride 114 (H) 98 - 111 mmol/L   CO2 21 (L) 22 - 32 mmol/L   Glucose, Bld 159 (H) 70 - 99 mg/dL    Comment: Glucose reference range applies only to samples taken after fasting for at least 8 hours.   BUN 51 (H) 8 - 23 mg/dL   Creatinine, Ser 6.21 (H) 0.61 - 1.24 mg/dL   Calcium 8.5 (L) 8.9 - 10.3 mg/dL   GFR, Estimated 34 (L) >60 mL/min    Comment: (NOTE) Calculated using the CKD-EPI Creatinine Equation (2021)    Anion gap 14 5 - 15    Comment: Performed at [facility], [address], [place], [place] [address]  CBC     Status: Abnormal   Collection Time: 07/02/23  1:36 AM  Result Value Ref Range   WBC 16.8 (H) 4.0 - 10.5 K/uL   RBC 4.18 (L) 4.22 - 5.81 MIL/uL   Hemoglobin 10.3 (L) 13.0 - 17.0 g/dL   HCT 78.4 (L) 69.6 - 29.5 %   MCV 79.4 (L) 80.0 - 100.0 fL   MCH 24.6 (L) 26.0 - 34.0 pg   MCHC 31.0 30.0 - 36.0 g/dL   RDW 28.4 (H) 13.2 - 44.0 %   Platelets 193 150 - 400 K/uL   nRBC 1.0 (H) 0.0 - 0.2 %  Comment: Performed at [facility], [address] [place], [place] [address]  Protime-INR     Status: Abnormal   Collection Time: 07/02/23  1:36 AM  Result Value Ref Range   Prothrombin Time 16.6 (H) 11.4 - 15.2 seconds   INR 1.3 (H) 0.8 - 1.2    Comment: (NOTE) INR goal varies based on device and disease states. Performed at [facility], [address] [place], [place] [address]   Magnesium     Status: None   Collection Time: 07/02/23  1:36 AM  Result Value Ref Range   Magnesium 2.2 1.7 - 2.4 mg/dL    Comment: Performed at [facility], [address], [place], [place] [address]  Glucose, capillary     Status: Abnormal   Collection Time: 07/02/23  3:04 AM  Result Value Ref Range   Glucose-Capillary 130 (H) 70 - 99 mg/dL    Comment: Glucose reference range applies only to samples taken after fasting for at least 8 hours.  Cooxemetry Panel (carboxy, met, total hgb, O2 sat)     Status: Abnormal   Collection Time: 07/02/23  3:09 AM  Result Value Ref Range   Total hemoglobin 10.4 (L) 12.0 - 16.0 g/dL   O2 Saturation 38.7 %   Carboxyhemoglobin 2.3 (H) 0.5 - 1.5 %   Methemoglobin 1.3 0.0 - 1.5 %    Comment: Performed at [facility], [address] [place], [place] [address]  Glucose, capillary     Status: Abnormal   Collection Time: 07/02/23  7:48 AM  Result Value Ref Range   Glucose-Capillary 119 (H) 70 - 99 mg/dL    Comment: Glucose reference range applies only to samples taken after fasting for at  least 8 hours.    Studies/Results: No results found.  Medications: I have reviewed the patient's current medications.  Assessment: Status post witnessed cardiac arrest, PEA, status post CPR, ROSC refractory  V-fib  GI bleed, duodenal deformity with friable mucosa on EGD treated with purastat gel  Liquid bloody stool?  Possible lower GI bleed?  Diverticular versus residual upper GI bleed  A-fib with RVR, new, TEE cardioversion 3/13  Acute hypoxic respiratory failure, improved  Left extremity DVT, going for IVC filter placement today    Plan: Ideally he would benefit from CAT scan of the abdomen to rule out a colonic pathology for GI blood loss such as diverticulosis, less likely malignancy.  He is not in any position to tolerate prep for colonoscopy if there is continued rectal bleeding.  Plan is to rechallenge him with heparin and if no further GI blood loss noted okay to start antiplatelets/anticoagulation as needed.  GI will follow peripherally, continue pantoprazole 40 mg every 12 hours at least for another month and then likely once a day indefinitely.  Okay to start full liquid diet after IVC placement which can be advanced to regular.  Billy Salen, MD 07/02/2023, 8:53 AM

## 2023-07-02 NOTE — Plan of Care (Signed)
  Problem: Fluid Volume: Goal: Ability to maintain a balanced intake and output will improve Outcome: Progressing   Problem: Metabolic: Goal: Ability to maintain appropriate glucose levels will improve Outcome: Progressing   Problem: Skin Integrity: Goal: Risk for impaired skin integrity will decrease Outcome: Progressing   Problem: Tissue Perfusion: Goal: Adequacy of tissue perfusion will improve Outcome: Progressing   Problem: Clinical Measurements: Goal: Ability to maintain clinical measurements within normal limits will improve Outcome: Progressing Goal: Will remain free from infection Outcome: Progressing Goal: Cardiovascular complication will be avoided Outcome: Progressing

## 2023-07-03 ENCOUNTER — Inpatient Hospital Stay (HOSPITAL_COMMUNITY)

## 2023-07-03 DIAGNOSIS — I4891 Unspecified atrial fibrillation: Secondary | ICD-10-CM | POA: Diagnosis not present

## 2023-07-03 DIAGNOSIS — E876 Hypokalemia: Secondary | ICD-10-CM

## 2023-07-03 DIAGNOSIS — I5023 Acute on chronic systolic (congestive) heart failure: Secondary | ICD-10-CM

## 2023-07-03 DIAGNOSIS — I8290 Acute embolism and thrombosis of unspecified vein: Secondary | ICD-10-CM

## 2023-07-03 DIAGNOSIS — I469 Cardiac arrest, cause unspecified: Secondary | ICD-10-CM | POA: Diagnosis not present

## 2023-07-03 LAB — HEPARIN LEVEL (UNFRACTIONATED): Heparin Unfractionated: <0.1 [IU]/mL — ABNORMAL LOW (ref 0.30–0.70)

## 2023-07-03 LAB — CBC
HCT: 32.3 % — ABNORMAL LOW (ref 39.0–52.0)
Hemoglobin: 9.8 g/dL — ABNORMAL LOW (ref 13.0–17.0)
MCH: 24.4 pg — ABNORMAL LOW (ref 26.0–34.0)
MCHC: 30.3 g/dL (ref 30.0–36.0)
MCV: 80.5 fL (ref 80.0–100.0)
Platelets: 183 10*3/uL (ref 150–400)
RBC: 4.01 MIL/uL — ABNORMAL LOW (ref 4.22–5.81)
RDW: 18.3 % — ABNORMAL HIGH (ref 11.5–15.5)
WBC: 14.8 10*3/uL — ABNORMAL HIGH (ref 4.0–10.5)
nRBC: 1 % — ABNORMAL HIGH (ref 0.0–0.2)

## 2023-07-03 LAB — UPEP/UIFE/LIGHT CHAINS/TP, 24-HR UR
% BETA, Urine: 24.6 %
ALPHA 1 URINE: 2.5 %
Albumin, U: 37 %
Alpha 2, Urine: 11.8 %
Free Kappa Lt Chains,Ur: 119.57 mg/L — ABNORMAL HIGH (ref 1.17–86.46)
Free Kappa/Lambda Ratio: 3.19 (ref 1.83–14.26)
Free Lambda Lt Chains,Ur: 37.47 mg/L — ABNORMAL HIGH (ref 0.27–15.21)
GAMMA GLOBULIN URINE: 24.1 %
Total Protein, Urine-Ur/day: 745 mg/(24.h) — ABNORMAL HIGH (ref 30–150)
Total Protein, Urine: 41.4 mg/dL
Total Volume: 1800

## 2023-07-03 LAB — BASIC METABOLIC PANEL
Anion gap: 10 (ref 5–15)
Anion gap: 11 (ref 5–15)
BUN: 42 mg/dL — ABNORMAL HIGH (ref 8–23)
BUN: 41 mg/dL — ABNORMAL HIGH (ref 8–23)
CO2: 21 mmol/L — ABNORMAL LOW (ref 22–32)
CO2: 22 mmol/L (ref 22–32)
Calcium: 8 mg/dL — ABNORMAL LOW (ref 8.9–10.3)
Calcium: 8.4 mg/dL — ABNORMAL LOW (ref 8.9–10.3)
Chloride: 114 mmol/L — ABNORMAL HIGH (ref 98–111)
Chloride: 113 mmol/L — ABNORMAL HIGH (ref 98–111)
Creatinine, Ser: 1.53 mg/dL — ABNORMAL HIGH (ref 0.61–1.24)
Creatinine, Ser: 1.64 mg/dL — ABNORMAL HIGH (ref 0.61–1.24)
GFR, Estimated: 49 mL/min — ABNORMAL LOW (ref >60)
GFR, Estimated: 45 mL/min — ABNORMAL LOW (ref >60)
Glucose, Bld: 132 mg/dL — ABNORMAL HIGH (ref 70–99)
Glucose, Bld: 131 mg/dL — ABNORMAL HIGH (ref 70–99)
Potassium: 3.3 mmol/L — ABNORMAL LOW (ref 3.5–5.1)
Potassium: 3.5 mmol/L (ref 3.5–5.1)
Sodium: 145 mmol/L (ref 135–145)
Sodium: 146 mmol/L — ABNORMAL HIGH (ref 135–145)

## 2023-07-03 LAB — GLUCOSE, CAPILLARY
Glucose-Capillary: 103 mg/dL — ABNORMAL HIGH (ref 70–99)
Glucose-Capillary: 104 mg/dL — ABNORMAL HIGH (ref 70–99)
Glucose-Capillary: 120 mg/dL — ABNORMAL HIGH (ref 70–99)
Glucose-Capillary: 121 mg/dL — ABNORMAL HIGH (ref 70–99)
Glucose-Capillary: 135 mg/dL — ABNORMAL HIGH (ref 70–99)
Glucose-Capillary: 94 mg/dL (ref 70–99)

## 2023-07-03 LAB — COOXEMETRY PANEL
Carboxyhemoglobin: 2.4 % — ABNORMAL HIGH (ref 0.5–1.5)
Methemoglobin: 0.8 % (ref 0.0–1.5)
O2 Saturation: 67.6 %
Total hemoglobin: 10.2 g/dL — ABNORMAL LOW (ref 12.0–16.0)

## 2023-07-03 LAB — MULTIPLE MYELOMA PANEL, SERUM
Albumin SerPl Elph-Mcnc: 2.1 g/dL — ABNORMAL LOW (ref 2.9–4.4)
Albumin/Glob SerPl: 0.9 (ref 0.7–1.7)
Alpha 1: 0.3 g/dL (ref 0.0–0.4)
Alpha2 Glob SerPl Elph-Mcnc: 0.7 g/dL (ref 0.4–1.0)
B-Globulin SerPl Elph-Mcnc: 0.7 g/dL (ref 0.7–1.3)
Gamma Glob SerPl Elph-Mcnc: 0.7 g/dL (ref 0.4–1.8)
Globulin, Total: 2.4 g/dL (ref 2.2–3.9)
IgA: 182 mg/dL (ref 61–437)
IgG (Immunoglobin G), Serum: 780 mg/dL (ref 603–1613)
IgM (Immunoglobulin M), Srm: 134 mg/dL (ref 20–172)
Total Protein ELP: 4.5 g/dL — ABNORMAL LOW (ref 6.0–8.5)

## 2023-07-03 LAB — MAGNESIUM
Magnesium: 2 mg/dL (ref 1.7–2.4)
Magnesium: 2.4 mg/dL (ref 1.7–2.4)

## 2023-07-03 MED ORDER — HYDRALAZINE HCL 50 MG PO TABS
100.0000 mg | ORAL_TABLET | Freq: Three times a day (TID) | ORAL | Status: DC
  Administered 2023-07-03 – 2023-07-09 (×20): 100 mg via ORAL
  Filled 2023-07-03 (×21): qty 2

## 2023-07-03 MED ORDER — TECHNETIUM TO 99M ALBUMIN AGGREGATED
4.1300 | Freq: Once | INTRAVENOUS | Status: AC | PRN
  Administered 2023-07-03: 4.13 via INTRAVENOUS

## 2023-07-03 MED ORDER — POTASSIUM CHLORIDE 10 MEQ/50ML IV SOLN
10.0000 meq | INTRAVENOUS | Status: AC
  Administered 2023-07-03 (×6): 10 meq via INTRAVENOUS
  Filled 2023-07-03 (×6): qty 50

## 2023-07-03 MED ORDER — HYDRALAZINE HCL 25 MG PO TABS
25.0000 mg | ORAL_TABLET | Freq: Once | ORAL | Status: AC
  Administered 2023-07-03: 25 mg via ORAL
  Filled 2023-07-03: qty 1

## 2023-07-03 MED ORDER — MAGNESIUM SULFATE 2 GM/50ML IV SOLN
2.0000 g | Freq: Once | INTRAVENOUS | Status: AC
  Administered 2023-07-03: 2 g via INTRAVENOUS
  Filled 2023-07-03: qty 50

## 2023-07-03 MED ORDER — AMIODARONE LOAD VIA INFUSION
150.0000 mg | Freq: Once | INTRAVENOUS | Status: AC
  Administered 2023-07-03: 150 mg via INTRAVENOUS

## 2023-07-03 MED ORDER — AMIODARONE LOAD VIA INFUSION
150.0000 mg | Freq: Once | INTRAVENOUS | Status: AC
  Administered 2023-07-03: 150 mg via INTRAVENOUS
  Filled 2023-07-03: qty 83.34

## 2023-07-03 MED ORDER — HEPARIN (PORCINE) 25000 UT/250ML-% IV SOLN
1800.0000 [IU]/h | INTRAVENOUS | Status: DC
  Administered 2023-07-03: 1000 [IU]/h via INTRAVENOUS
  Administered 2023-07-04: 1400 [IU]/h via INTRAVENOUS
  Administered 2023-07-05: 1600 [IU]/h via INTRAVENOUS
  Filled 2023-07-03 (×3): qty 250

## 2023-07-03 MED ORDER — AMIODARONE IV BOLUS ONLY 150 MG/100ML: 150.0000 mg | Freq: Once | INTRAVENOUS | Status: DC

## 2023-07-03 MED ORDER — POTASSIUM CHLORIDE 10 MEQ/50ML IV SOLN
10.0000 meq | INTRAVENOUS | Status: AC
Start: 2023-07-03 — End: 2023-07-03
  Administered 2023-07-03 (×4): 10 meq via INTRAVENOUS
  Filled 2023-07-03 (×4): qty 50

## 2023-07-03 MED ORDER — FUROSEMIDE 10 MG/ML IJ SOLN
60.0000 mg | Freq: Once | INTRAMUSCULAR | Status: AC
  Administered 2023-07-03: 60 mg via INTRAVENOUS
  Filled 2023-07-03: qty 6

## 2023-07-03 MED ORDER — POTASSIUM CHLORIDE 10 MEQ/50ML IV SOLN
INTRAVENOUS | Status: AC
  Filled 2023-07-03: qty 50

## 2023-07-03 MED ORDER — POTASSIUM CHLORIDE 10 MEQ/100ML IV SOLN: 10.0000 meq | INTRAVENOUS | Status: DC

## 2023-07-03 NOTE — Progress Notes (Addendum)
 Patient ID: Billy [name], male   DOB: [birth year], 73 y.o.   MRN: [id number]     Advanced Heart Failure Rounding Note  Cardiologist: Nanetta Batty, MD   Chief Complaint: RV failure  Subjective:   EGD (3/15) showed bleeding friable duodenal mucosa treated with hemostatic spray. No overt GI bleeding today, hgb 9.8.   In and out of atrial fibrillation, currently in NSR on amiodarone 60 mg/hr.  Off heparin with GI bleeding.   Co-ox 68%, no pressors/inotropes.  BP elevated.   CVP <5 this morning.  Afebrile, WBCs 17.7 > 16.8> 14.8.  Completed ceftriaxone/doxycycline.   Sitting up in chair.   Objective:    Weight Range: 83.1 kg Body mass index is 24.17 kg/m.   Vital Signs: Temp:  [97.3 F (36.3 C)-99.3 F (37.4 C)] 98.2 F (36.8 C) (03/17 0600) Pulse Rate:  [71-125] 87 (03/17 0600) Resp:  [11-33] 15 (03/17 0600) BP: (140-206)/(60-168) 173/73 (03/17 0600) SpO2:  [67 %-100 %] 94 % (03/17 0600) Weight:  [83.1 kg] 83.1 kg (03/17 0348) Last BM Date : 06/30/23  Weight change: Filed Weights   06/30/23 0500 07/02/23 0407 07/03/23 0348  Weight: 85.8 kg 83.7 kg 83.1 kg   Intake/Output:  Intake/Output Summary (Last 24 hours) at 07/03/2023 0841 Last data filed at 07/03/2023 0600 Gross per 24 hour  Intake 1869.25 ml  Output 2360 ml  Net -490.75 ml    Physical Exam    General:  elderly appearing.  No respiratory difficulty Neck: supple. JVD flat. LSC CVC Cor: PMI nondisplaced. Regular rate & rhythm. No rubs, gallops or murmurs. Lungs: clear Abdomen: soft, nontender, nondistended. Good bowel sounds. GU: +foley Extremities: no cyanosis, clubbing, rash, +2 BLE edema. +1 RUE edema Neuro: alert & oriented x 2-3. Affect flat.   Telemetry   NSR 90s (personally reviewed)  EKG    N/A  Labs    CBC Recent Labs    07/02/23 0136 07/03/23 0332  WBC 16.8* 14.8*  HGB 10.3* 9.8*  HCT 33.2* 32.3*  MCV 79.4* 80.5  PLT 193 183   Basic Metabolic Panel Recent Labs     07/01/23 0500 07/02/23 0136 07/03/23 0332  NA 146* 149* 145  K 3.7 3.7 3.3*  CL 114* 114* 114*  CO2 22 21* 21*  GLUCOSE 108* 159* 132*  BUN 51* 51* 42*  CREATININE 2.28* 2.05* 1.53*  CALCIUM 8.1* 8.5* 8.0*  MG 2.1 2.2 2.0  PHOS 3.1  --   --    Liver Function Tests No results for input(s): "AST", "ALT", "ALKPHOS", "BILITOT", "PROT", "ALBUMIN" in the last 72 hours.  No results for input(s): "LIPASE", "AMYLASE" in the last 72 hours. Cardiac Enzymes No results for input(s): "CKTOTAL", "CKMB", "CKMBINDEX", "TROPONINI" in the last 72 hours.  BNP: BNP (last 3 results) Recent Labs    03/09/23 1544 03/11/23 0218 06/27/23 1116  BNP 660.9* 491.3* 646.4*    Imaging   IR IVC FILTER PLMT / S&I /IMG GUID/MOD SED Result Date: 07/02/2023 CLINICAL DATA:  Left lower extremity DVT, GI bleed and cardiac arrest. Need for IVC filter due to contraindication to anticoagulation currently. EXAM: 1. ULTRASOUND GUIDANCE FOR VASCULAR ACCESS OF THE RIGHT INTERNAL JUGULAR VEIN. 2. IVC VENOGRAM. 3. PERCUTANEOUS IVC FILTER PLACEMENT. ANESTHESIA/SEDATION: Moderate (conscious) sedation was employed during this procedure. A total of Versed 1.0 mg and Fentanyl 100 mcg was administered intravenously. Moderate Sedation Time: 15 minutes. The patient's level of consciousness and vital signs were monitored continuously by radiology nursing throughout the procedure under my  direct supervision. CONTRAST:  CO2 gas FLUOROSCOPY TIME:  1 minute and 18 seconds.  11.8 mGy. PROCEDURE: The procedure, risks, benefits, and alternatives were explained to the patient. Questions regarding the procedure were encouraged and answered. The patient understands and consents to the procedure. A time-out was performed prior to initiating the procedure. The right neck was prepped with chlorhexidine in a sterile fashion, and a sterile drape was applied covering the operative field. A sterile gown and sterile gloves were used for the procedure.  Local anesthesia was provided with 1% Lidocaine. Ultrasound was utilized to confirm patency of the right internal jugular vein. Under ultrasound image was saved and recorded. Under direct ultrasound guidance, a 21 gauge needle was advanced into the right internal jugular vein with ultrasound image documentation performed. After securing access with a micropuncture dilator, a guidewire was advanced into the inferior vena cava. A deployment sheath was advanced over the guidewire. This was utilized to perform IVC venography. The deployment sheath was further positioned in an appropriate location for filter deployment. A Bard Denali IVC filter was then advanced in the sheath. This was then fully deployed in the infrarenal IVC. Final filter position was confirmed with a fluoroscopic spot image. After the procedure the sheath was removed and hemostasis obtained with manual compression. COMPLICATIONS: None. FINDINGS: CO2 was used for IVC venography due to underlying renal insufficiency due to chronic kidney disease. IVC venography demonstrates a normal caliber IVC with no evidence of thrombus. Renal veins are identified bilaterally. The IVC filter was successfully positioned below the level of the renal veins and is appropriately oriented. This IVC filter has both permanent and retrievable indications. IMPRESSION: Placement of percutaneous IVC filter in infrarenal IVC. IVC venogram shows no evidence of IVC thrombus and normal caliber of the inferior vena cava. This filter does have both permanent and retrievable indications. PLAN: This IVC filter is potentially retrievable. The patient will be assessed for filter retrieval by Interventional Radiology in approximately 8-12 weeks. Further recommendations regarding filter retrieval, continued surveillance or declaration of device permanence, will be made at that time. Electronically Signed   By: Irish Lack M.D.   On: 07/02/2023 13:18    Medications:    Scheduled  Medications:  atorvastatin  80 mg Oral Daily   Chlorhexidine Gluconate Cloth  6 each Topical Daily   ezetimibe  10 mg Oral Daily   feeding supplement  237 mL Oral TID BM   hydrALAZINE  75 mg Oral Q8H   insulin aspart  0-9 Units Subcutaneous Q4H   methocarbamol (ROBAXIN) injection  1,000 mg Intravenous Q8H   pantoprazole (PROTONIX) IV  40 mg Intravenous Q12H   sertraline  50 mg Oral QHS   sodium chloride flush  10-40 mL Intracatheter Q12H   Infusions:  amiodarone 60 mg/hr (07/03/23 0600)   potassium chloride 10 mEq (07/03/23 0744)    PRN Medications: docusate sodium, hydrALAZINE, HYDROmorphone (DILAUDID) injection, influenza vaccine adjuvanted, iohexol, iohexol, labetalol, mouth rinse, pneumococcal 20-valent conjugate vaccine, polyethylene glycol, sodium chloride flush  Patient Profile   Rashidi Loh is a 73 yo male with PMH of HTN, HLD, prior DVT, CVA, paroxysmal atrial fibrillation, CKD stage IIIb, carotid artery disease s/p endarterectomy, and chronic diastolic CHF. Admitted following out of hospital witnessed cardiac arrest.   Assessment/Plan   1. Cardiac arrest: Witnessed arrest with bystander then EMS CPR.  >20 minutes CPR.  He was shocked for VF.  Had additional 4-5 minutes PEA arrest in ER. ?PE with aspiration given bilateral DVTs.  -  He is on amiodarone.  2. Cardiogenic shock: Initial echo: EF 45%, diffuse HK, severe LVH, normal RV size with mod decreased systolic function, small pericardial effusion, dilated IVC.  Suspicion for PE as above.  PNA on CXR, initial hypotension thought to be due to septic shock.  He is now off pressors.   3. Acute HF with mid range EF: With prominent RV failure. Initial echo: EF 45%, diffuse HK, severe LVH, normal RV size with mod decreased systolic function, small pericardial effusion, dilated IVC. As above, worry for PE with RV failure. Baseline cardiomyopathy with severe LVH raises concern for cardiac amyloidosis.  TEE 03/13: EF 55%, severe LVH  with mild RV dysfunction, small IVC.  - CVP <5 today. Co-ox 68%. Got lasix 40 mg x1 yesterday. -2.5 L. Give 60 IV lasix x1 today.  - Place TED hose - Increase hydralazine to 100 tid for HTN.  - He should have workup for cardiac amyloidosis, cardiac MRI before discharge.   4. Venous thromboembolism: Bilateral DVTs, mixed acute and chronic.  He is not anticoagulated at home.  With RV failure on echo, strong suspicion for PE.  - Unfortunately off AC with GI bleeding.  Now s/p infrarenal IVC placement 3/16.   - Eventual V/Q scan. No CTA with AKI.   5. CAD: NSTEMI with HS-TnI 3045.  ?Demand ischemia from arrest vs ACS.  He has history of carotid disease. - Plavix stopped with GI bleeding.  - Atorvastatin 80 daily.  - Avoid coronary angiography with no STEMI and AKI.   6. Carotid stenosis: h/o CVA.  TCAR in 6/24.  OK to stop Plavix at this point (now off).   7. Atrial fibrillation: He was not on AC at home though has history of prior CVA.  AC stopped due to prior GI bleeds. He went into AF/RVR here, tolerated poorly.  TEE/DCCV on 03/13.  Now appears to be in NSR primarily but still having runs of AF.  - Unfortunately off heparin gtt due to GI bleeding.  Would like to restart at least short-term [facility] with recent cardioversion, may need [facility]man more long-term. Will try to restart heparin today. Now with IVC filter.  No further overt GI bleeding.  - Continue amiodarone gtt 60 mg/hr for now for further loading with frequent breakthrough AF.    8. AKI on CKD 3b: Suspect ATN in setting of hypotension/arrest.  Baseline Scr seems to be around 2.1. Creatinine 2.55 > 2.28 > 2.05>1.53 today.  9. ID: PCT 8.37, suspect PNA.   - He has completed course of ceftriaxone/doxycycline.   10. Neuro: Somewhat slowed post arrest.  11. GI bleeding: Hematochezia with AC.  Had this in the past with [facility] as well.  GI following.  Heparin gtt stopped, has had transfusion here. EGD showed bleeding friable duodenal mucosa  treated with hemostatic spray.  Ideally, would restart at least short-term [facility] and then plan Watchman in the future.  - Continue PPI IV - Plan to rechallenge with heparin gtt.    Needs aggressive PT/OT. Needs speech eval today. Suspect he may be able to transfer to the floor.   Alen Bleacher AGACNP-BC 07/03/2023 8:41 AM  Agree with above.   Remains weak. NE off overnight. Co-ox 68% CVP 5-6. Remains in AF with RVR on amio 30. [place] improving  Had BM yesterday  IVC filter in place.   General:  Sitting in chair. Weak appearing. No resp difficulty HEENT: normal Neck: supple. JVP 6  Carotids 2+ bilat; no bruits.  No lymphadenopathy or thryomegaly appreciated. Cor: PMI nondisplaced. Irreg tachy  Lungs: clear Abdomen: soft, nontender, nondistended. No hepatosplenomegaly. No bruits or masses. Good bowel sounds. Extremities: no cyanosis, clubbing, rash, 2+ edema upper and lower extremities  Neuro: alert & orientedx3, cranial nerves grossly intact. moves all 4 extremities w/o difficulty. Affect pleasant  Remains very tenuous. Still very edematous. Continue IV diuresis. Wrap extremities. U/s BUE.   Arrest has been attributed to PE but we have no objective proof of that yet. As Scr improves I think he will need both cardiac cath and CT chest if possible to understand exactly what happened. If Scr not improving much more will lean toward VQ and cMRI.   Will increase amio to 60/hr. Continue heparin. Will need TEE/DC-CV prior to discharge  CRITICAL CARE Performed by: Arvilla Meres  Total critical care time: 55 minutes  Critical care time was exclusive of separately billable procedures and treating other patients.  Critical care was necessary to treat or prevent imminent or life-threatening deterioration.  Critical care was time spent personally by me (independent of midlevel providers or residents) on the following activities: development of treatment plan with patient and/or surrogate as well  as nursing, discussions with consultants, evaluation of patient's response to treatment, examination of patient, obtaining history from patient or surrogate, ordering and performing treatments and interventions, ordering and review of laboratory studies, ordering and review of radiographic studies, pulse oximetry and re-evaluation of patient's condition.  Arvilla Meres, MD  5:47 PM

## 2023-07-03 NOTE — Progress Notes (Signed)
 [facility] ADULT ICU REPLACEMENT PROTOCOL   The patient does apply for the [facility] Adult ICU Electrolyte Replacment Protocol based on the criteria listed below:   1.Exclusion criteria: TCTS, ECMO, Dialysis, and Myasthenia Gravis patients 2. Is GFR >/= 30 ml/min? Yes.    Patient's GFR today is 49 3. Is SCr </= 2? Yes.   Patient's SCr is 1.53 mg/dL 4. Did SCr increase >/= 0.5 in 24 hours? No. 5.Pt's weight >40kg  Yes.   6. Abnormal electrolyte(s): K+3.3  7. Electrolytes replaced per protocol 8.  Call MD STAT for K+ </= 2.5, Phos </= 1, or Mag </= 1 Physician:  Dr Loralyn Freshwater, Lilia Argue 07/03/2023 4:44 AM

## 2023-07-03 NOTE — TOC Initial Note (Signed)
 Transition of Care [facility]) - Initial/Assessment Note    Patient Details  Name: Billy [name] MRN: [id number] Date of Birth: 1951-03-02  Transition of Care [facility]) CM/SW Contact:    Elliot Cousin, RN Phone Number: [phone] 07/03/2023, 2:48 PM   Clinical Narrative:                  TOC CM spoke to pt at bedside. Gave permission to speak to sister, daughter, son and ex-wife [name]. States he lives at home alone. Discussed HH, IP rehab and SNF rehab. PT will evaluate and give recommendations for dc. Will continue to follow for dc needs.     Expected Discharge Plan: IP Rehab Facility Barriers to Discharge: Continued Medical Work up   Patient Goals and CMS Choice Patient states their goals for this hospitalization and ongoing recovery are:: wants to get better CMS Medicare.gov Compare Post Acute Care list provided to:: Patient Choice offered to / list presented to : Patient      Expected Discharge Plan and Services   Discharge Planning Services: CM Consult Post Acute Care Choice: IP Rehab Living arrangements for the past 2 months: Single Family Home                                      Prior Living Arrangements/Services Living arrangements for the past 2 months: Single Family Home Lives with:: Self Patient language and need for interpreter reviewed:: Yes Do you feel safe going back to the place where you live?: Yes      Need for Family Participation in Patient Care: Yes (Comment) Care giver support system in place?: Yes (comment)   Criminal Activity/Legal Involvement Pertinent to Current Situation/Hospitalization: No - Comment as needed  Activities of Daily Living   ADL Screening (condition at time of admission) Independently performs ADLs?: Yes (appropriate for developmental age) Is the patient deaf or have difficulty hearing?: No Does the patient have difficulty seeing, even when wearing glasses/contacts?: No Does the patient have difficulty  concentrating, remembering, or making decisions?: No  Permission Sought/Granted Permission sought to share information with : Case Manager, Family Supports, PCP Permission granted to share information with : Yes, Verbal Permission Granted  Share Information with NAME: [name]  Permission granted to share info w AGENCY: IP rehab, SNF rehab  Permission granted to share info w Relationship: sister  Permission granted to share info w Contact Information: [phone]  Emotional Assessment Appearance:: Appears stated age Attitude/Demeanor/Rapport: Engaged Affect (typically observed): Accepting Orientation: : Oriented to Self, Oriented to Place, Oriented to  Time, Oriented to Situation Alcohol / Substance Use: Not Applicable Psych Involvement: No (comment)  Admission diagnosis:  Cardiac arrest (HCC) [I46.9] Abnormal EKG [R94.31] Hypothermia, initial encounter [T68.XXXA] Patient Active Problem List   Diagnosis Date Noted   Cardiac arrest (HCC) 06/26/2023   Gastritis 03/12/2023   Acute on chronic diastolic CHF (congestive heart failure) (HCC) 03/10/2023   Carotid artery stenosis 10/13/2022   Carotid artery stenosis, asymptomatic, right 10/13/2022   Asymptomatic carotid artery stenosis, left 07/28/2022   Biceps tendonitis on right    Tear of right supraspinatus tendon    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    PAF (paroxysmal atrial fibrillation) (HCC) 09/13/2018   Upper GI bleeding 07/27/2018   History of non-ST elevation myocardial infarction (NSTEMI) 06/15/2018   AKI (acute kidney injury) (HCC)  04/11/2018   Gastrointestinal hemorrhage with melena 04/11/2018   Acute blood loss anemia 04/11/2018   Metabolic acidosis, normal anion gap (NAG) 04/11/2018   Carotid artery disease (HCC) 01/17/2018   Hyperlipidemia 01/17/2018   Peripheral neuropathy 12/25/2017   Hypertension 12/12/2017   Transient neurologic deficit 12/12/2017    History of CVA (cerebrovascular accident) 12/12/2017   Transient ischemic attack    Hypertensive 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 CHF 04/20/2017   History of DVT (deep vein thrombosis) 04/20/2017   Hematuria 04/20/2017   CKD stage 3b, GFR 30-44 ml/min (HCC) 04/20/2017   PCP:  [name], MD Pharmacy:   [facility] [phone] - [place], [place] - [address] [place] [address] Phone: [phone] Fax: [phone]  [facility] [address] [place] [address] Phone: [phone] Fax: [phone]     Social Drivers of Health (SDOH) Social History: SDOH Screenings   Food Insecurity: No Food Insecurity (06/27/2023)  Housing: Low Risk  (06/27/2023)  Transportation Needs: No Transportation Needs (06/27/2023)  Utilities: Not At Risk (06/27/2023)  Depression (PHQ2-9): Low Risk  (12/01/2020)  Social Connections: Socially Isolated (06/27/2023)  Tobacco Use: Low Risk  (07/01/2023)   SDOH Interventions:     Readmission Risk Interventions    06/27/2023    1:52 PM 10/14/2022   11:29 AM  Readmission Risk Prevention Plan  Post Dischage Appt  Complete  Medication Screening  Complete  Transportation Screening Complete Complete  HRI or Home Care Consult Complete   Social Work Consult for Recovery Care Planning/Counseling Complete   Palliative Care Screening Not Applicable   Medication Review Oceanographer) Referral to Pharmacy

## 2023-07-03 NOTE — Progress Notes (Signed)
 Patient in and out of what appears to be atrial flutter vs VT? Rates up to 180s. Will get EKG. Patient asymptomatic. Modified valsalva maneuver performed> a fib with rates back down to 110s.   Replete Mg. Check BMET/Mg.   Give amiodarone bolus.   Will continue to follow.   Brynda Peon, AGACNP-BC  Advanced Heart Failure Team 07/03/23
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
## 2023-07-03 NOTE — Progress Notes (Addendum)
 [facility] PROGRESS NOTE   Billy [name] ZOX:[id number] DOB: 1950/10/11 DOA: 06/26/2023  PCP: [name], MD  Brief History: 73 y/o male with h/o CHF, CKD stage IV, Atrial Fibrillation, BPH, Anemia, carotid artery disease s/p endarterectomy and stenting, chornic rhinitis, HLD and RLS who went to his neighbors house after having SOB for at least 1 week and had a cardiac arrest.  Neighbor stated CPR immediately and EMS continued .  He got ROSC back after about 15 min.  Then in the ED he arrested again for about 2-5 min and then achieved ROSC.  Patient was admitted to the ICU.  Consultants: Cardiology.  Critical care medicine.  Gastroenterology.  Interventional radiology.  Procedures: IVC filter placement.  Central line placement.    Subjective/Interval History: Patient noted to be sitting up in the chair.  Complains of weakness but denies any chest pain or shortness of breath this morning.  No nausea vomiting.  No bleeding per rectum reported by nursing staff and last at least 2 days.   Assessment/Plan:  ADDENDUM Called by RN that patient was experiencing episodes of VT vs AFIB. Patient has been asymptomatic. Cardiology is addressing/managing. Amio bolus to be given. Labs ordered. Requesting change back to ICU status which was done. Notified CCM as well.  Cardiac arrest/cardiogenic shock/acute CHF This was witnessed and patient underwent CPR.  Cardiology is following. Patient is on amiodarone.  Noted to be on hydralazine.  Was given Lasix x 1 yesterday.  Further management per cardiology. Echocardiogram showed LVEF of 45% with diffuse hypokinesis. He required pressors while he was in the ICU.  No longer now. Cardiology plans a cardiac MRI before discharge.  Atrial fibrillation with RVR Not on anticoagulation at home. He underwent TEE followed by DC cardioversion on 3/13.  Had converted to sinus rhythm but noted to be in atrial fibrillation this morning. Anticoagulation was  initiated with IV heparin but then he experienced hematochezia.  Was seen by gastroenterology.  See below. No bleeding in the last 2 days.  Hemoglobin is stable.  Plan was to resume heparin today and see if he has recurrence of bleeding.  Heparin has been ordered. Currently patient is not on any rate limiting drugs.  Acute respiratory failure with hypoxia This is in the setting of cardiac arrest.  No history of smoking.  Currently on 5 L of oxygen by nasal cannula with saturations in the late 90s.  Continue to monitor.  Hopefully wean him off of oxygen.  Venous thromboembolism Noted to have bilateral DVTs.  There was concern for PE due to RV failure on echocardiogram.  CT angiogram could not be done due to his AKI. He is status post IVC filter placement. Heparin to be reinitiated today as per pulmonology and cardiology notes.  Hematochezia Seen by gastroenterology.  Underwent EGD which showed duodenitis.  Not a candidate for colonoscopy due to his tenuous cardiac and respiratory status.  No bleeding in the last 48 hours.  Anticoagulation to be resumed today.  Acute kidney injury on chronic kidney disease stage IIIb Suspect ATN in the setting of cardiac arrest and hypotension.  Renal function is gradually improving.  Monitor urine output.  Hypokalemia Being supplemented.  Magnesium 2.0.  Acute blood loss anemia Hemoglobin 9.8 today and stable.  Monitor closely.  Concern for pneumonia Had elevated procalcitonin level of 8.37.  He has completed course of ceftriaxone and doxycycline.  Coronary artery disease/NSTEMI Could have been demand ischemia though is uncertain.  He was on Plavix  which was held due to GI bleed.  He is on statin. Management per cardiology.  Previous history of stroke/carotid artery disease He is status post TCAR in June 2024.  Concern for dysphagia Noted to be NPO.  SLP evaluation is pending.  Foley catheter in situ Voiding trial once he is more stable.  DVT  Prophylaxis: IV heparin to be initiated today Code Status: Full code Family Communication: Discussed with patient.  Will update his daughter later today Disposition Plan: CIR is being considered    Medications: Scheduled:  atorvastatin  80 mg Oral Daily   Chlorhexidine Gluconate Cloth  6 each Topical Daily   ezetimibe  10 mg Oral Daily   feeding supplement  237 mL Oral TID BM   hydrALAZINE  100 mg Oral Q8H   insulin aspart  0-9 Units Subcutaneous Q4H   methocarbamol (ROBAXIN) injection  1,000 mg Intravenous Q8H   pantoprazole (PROTONIX) IV  40 mg Intravenous Q12H   sertraline  50 mg Oral QHS   sodium chloride flush  10-40 mL Intracatheter Q12H   Continuous:  amiodarone 60 mg/hr (07/03/23 0600)   potassium chloride 10 mEq (07/03/23 0744)   ZOX:WRUEAVWU sodium, hydrALAZINE, HYDROmorphone (DILAUDID) injection, influenza vaccine adjuvanted, iohexol, iohexol, labetalol, mouth rinse, pneumococcal 20-valent conjugate vaccine, polyethylene glycol, sodium chloride flush  Antibiotics: Anti-infectives (From admission, onward)    Start     Dose/Rate Route Frequency Ordered Stop   06/29/23 1000  cefTRIAXone (ROCEPHIN) 2 g in sodium chloride 0.9 % 100 mL IVPB        2 g 200 mL/hr over 30 Minutes Intravenous Every 24 hours 06/28/23 1015 07/01/23 1008   06/28/23 0915  Ampicillin-Sulbactam (UNASYN) 3 g in sodium chloride 0.9 % 100 mL IVPB  Status:  Discontinued        3 g 200 mL/hr over 30 Minutes Intravenous Every 12 hours 06/28/23 0822 06/28/23 1015   06/27/23 0915  cefTRIAXone (ROCEPHIN) 2 g in sodium chloride 0.9 % 100 mL IVPB  Status:  Discontinued        2 g 200 mL/hr over 30 Minutes Intravenous Every 24 hours 06/27/23 0828 06/28/23 0822   06/27/23 0915  doxycycline (VIBRAMYCIN) 100 mg in sodium chloride 0.9 % 250 mL IVPB        100 mg 125 mL/hr over 120 Minutes Intravenous Every 12 hours 06/27/23 0828 07/01/23 2310       Objective:  Vital Signs  Vitals:   07/03/23 0450  07/03/23 0500 07/03/23 0510 07/03/23 0600  BP:  (!) 161/67  (!) 173/73  Pulse: 82 90 79 87  Resp: 15 17 16 15   Temp: 98.6 F (37 C) 98.4 F (36.9 C) 98.4 F (36.9 C) 98.2 F (36.8 C)  TempSrc:      SpO2: 99% 96% 96% 94%  Weight:      Height:        Intake/Output Summary (Last 24 hours) at 07/03/2023 0852 Last data filed at 07/03/2023 0600 Gross per 24 hour  Intake 1869.25 ml  Output 2360 ml  Net -490.75 ml   Filed Weights   06/30/23 0500 07/02/23 0407 07/03/23 0348  Weight: 85.8 kg 83.7 kg 83.1 kg    General appearance: Awake alert.  In no distress Resp: Clear to auscultation bilaterally.  Normal effort Cardio: S1-S2 is tachycardic irregular GI: Abdomen is soft.  Nontender nondistended.  Bowel sounds are present normal.  No masses organomegaly Extremities: Edema bilateral upper and lower extremities Neurologic: Alert and oriented x3.  No  focal neurological deficits.    Lab Results:  Data Reviewed: I have personally reviewed following labs and reports of the imaging studies  CBC: Recent Labs  Lab 06/26/23 2039 06/27/23 0315 06/30/23 1717 07/01/23 0500 07/01/23 1557 07/02/23 0136 07/03/23 0332  WBC 18.1*   < > 17.7* 17.7* 16.3* 16.8* 14.8*  NEUTROABS 11.0*  --   --   --   --   --   --   HGB 11.2*   < > 9.7* 9.9* 9.8* 10.3* 9.8*  HCT 38.9*   < > 30.3* 31.7* 31.5* 33.2* 32.3*  MCV 87.8   < > 78.3* 78.1* 79.3* 79.4* 80.5  PLT 341   < > 209 195 172 193 183   < > = values in this interval not displayed.    Basic Metabolic Panel: Recent Labs  Lab 06/27/23 0333 06/27/23 0510 06/28/23 0350 06/29/23 0408 06/29/23 1221 06/30/23 0438 06/30/23 1717 07/01/23 0500 07/02/23 0136 07/03/23 0332  NA 147*   < > 143 142   < > 142 144 146* 149* 145  K 4.4   < > 3.5 3.0*   < > 3.1* 3.6 3.7 3.7 3.3*  CL 113*   < > 108 109   < > 110 110 114* 114* 114*  CO2 21*   < > 20* 22   < > 20* 20* 22 21* 21*  GLUCOSE 213*   < > 150* 123*   < > 122* 103* 108* 159* 132*  BUN 51*    < > 55* 57*   < > 57* 53* 51* 51* 42*  CREATININE 2.41*   < > 2.70* 2.70*   < > 2.55* 2.38* 2.28* 2.05* 1.53*  CALCIUM 7.5*   < > 7.7* 7.6*   < > 7.6* 7.7* 8.1* 8.5* 8.0*  MG 2.1  --  1.7 1.9  --  2.0  --  2.1 2.2 2.0  PHOS 7.1*  --  3.3 3.6  --  3.0  --  3.1  --   --    < > = values in this interval not displayed.    GFR: Estimated Creatinine Clearance: 50.8 mL/min (A) (by C-G formula based on SCr of 1.53 mg/dL (H)).  Liver Function Tests: Recent Labs  Lab 06/27/23 0333  AST 78*  ALT 57*  ALKPHOS 63  BILITOT 0.7  PROT 4.5*  ALBUMIN 1.7*    Coagulation Profile: Recent Labs  Lab 07/02/23 0136  INR 1.3*    Cardiac Enzymes: Recent Labs  Lab 06/27/23 0333  CKTOTAL 1,439*    CBG: Recent Labs  Lab 07/02/23 1528 07/02/23 1943 07/02/23 2304 07/03/23 0314 07/03/23 0756  GLUCAP 110* 125* 139* 135* 104*    Recent Results (from the past 240 hours)  MRSA Next Gen by PCR, Nasal     Status: None   Collection Time: 06/26/23 10:34 PM   Specimen: Nasal Mucosa; Nasal Swab  Result Value Ref Range Status   MRSA by PCR Next Gen NOT DETECTED NOT DETECTED Final    Comment: (NOTE) The GeneXpert MRSA Assay (FDA approved for NASAL specimens only), is one component of a comprehensive MRSA colonization surveillance program. It is not intended to diagnose MRSA infection nor to guide or monitor treatment for MRSA infections. Test performance is not FDA approved in patients less than 85 years old. Performed at [facility], [address] [place], [place] [address]   Respiratory (~20 pathogens) panel by PCR     Status: None  Collection Time: 06/27/23 10:14 AM   Specimen: Nasopharyngeal Swab; Respiratory  Result Value Ref Range Status   Adenovirus NOT DETECTED NOT DETECTED Final   Coronavirus 229E NOT DETECTED NOT DETECTED Final    Comment: (NOTE) The Coronavirus on the Respiratory Panel, DOES NOT test for the novel  Coronavirus (2019 nCoV)    Coronavirus HKU1 NOT  DETECTED NOT DETECTED Final   Coronavirus NL63 NOT DETECTED NOT DETECTED Final   Coronavirus OC43 NOT DETECTED NOT DETECTED Final   Metapneumovirus NOT DETECTED NOT DETECTED Final   Rhinovirus / Enterovirus NOT DETECTED NOT DETECTED Final   Influenza A NOT DETECTED NOT DETECTED Final   Influenza B NOT DETECTED NOT DETECTED Final   Parainfluenza Virus 1 NOT DETECTED NOT DETECTED Final   Parainfluenza Virus 2 NOT DETECTED NOT DETECTED Final   Parainfluenza Virus 3 NOT DETECTED NOT DETECTED Final   Parainfluenza Virus 4 NOT DETECTED NOT DETECTED Final   Respiratory Syncytial Virus NOT DETECTED NOT DETECTED Final   Bordetella pertussis NOT DETECTED NOT DETECTED Final   Bordetella Parapertussis NOT DETECTED NOT DETECTED Final   Chlamydophila pneumoniae NOT DETECTED NOT DETECTED Final   Mycoplasma pneumoniae NOT DETECTED NOT DETECTED Final    Comment: Performed at [facility], [address] [place], [place] [address]  Resp panel by RT-PCR (RSV, Flu A&B, Covid) Urine, Catheterized     Status: None   Collection Time: 06/27/23 10:14 AM   Specimen: Urine, Catheterized; Nasal Swab  Result Value Ref Range Status   SARS Coronavirus 2 by RT PCR NEGATIVE NEGATIVE Final   Influenza A by PCR NEGATIVE NEGATIVE Final   Influenza B by PCR NEGATIVE NEGATIVE Final    Comment: (NOTE) The Xpert Xpress SARS-CoV-2/FLU/RSV plus assay is intended as an aid in the diagnosis of influenza from Nasopharyngeal swab specimens and should not be used as a sole basis for treatment. Nasal washings and aspirates are unacceptable for Xpert Xpress SARS-CoV-2/FLU/RSV testing.  Fact Sheet for Patients: BloggerCourse.com  Fact Sheet for Healthcare Providers: SeriousBroker.it  This test is not yet approved or cleared by the [place] FDA and has been authorized for detection and/or diagnosis of SARS-CoV-2 by FDA under an Emergency Use Authorization (EUA). This  EUA will remain in effect (meaning this test can be used) for the duration of the COVID-19 declaration under Section 564(b)(1) of the Act, 21 U.S.C. section 360bbb-3(b)(1), unless the authorization is terminated or revoked.     Resp Syncytial Virus by PCR NEGATIVE NEGATIVE Final    Comment: (NOTE) Fact Sheet for Patients: BloggerCourse.com  Fact Sheet for Healthcare Providers: SeriousBroker.it  This test is not yet approved or cleared by the [place] FDA and has been authorized for detection and/or diagnosis of SARS-CoV-2 by FDA under an Emergency Use Authorization (EUA). This EUA will remain in effect (meaning this test can be used) for the duration of the COVID-19 declaration under Section 564(b)(1) of the Act, 21 U.S.C. section 360bbb-3(b)(1), unless the authorization is terminated or revoked.  Performed at [facility], [address] [place], [place] [address]   Culture, Respiratory w Gram Stain     Status: None   Collection Time: 06/27/23 10:51 AM   Specimen: Tracheal Aspirate; Respiratory  Result Value Ref Range Status   Specimen Description TRACHEAL ASPIRATE  Final   Special Requests NONE  Final   Gram Stain   Final    FEW WBC PRESENT,BOTH PMN AND MONONUCLEAR RARE GRAM POSITIVE COCCI IN PAIRS IN CLUSTERS  Culture   Final    RARE Normal respiratory flora-no Staph aureus or Pseudomonas seen Performed at [facility], [address], [place], [place] [address]    Report Status 06/29/2023 FINAL  Final      Radiology Studies: IR IVC FILTER PLMT / S&I Lenise Arena GUID/MOD SED Result Date: 07/02/2023 CLINICAL DATA:  Left lower extremity DVT, GI bleed and cardiac arrest. Need for IVC filter due to contraindication to anticoagulation currently. EXAM: 1. ULTRASOUND GUIDANCE FOR VASCULAR ACCESS OF THE RIGHT INTERNAL JUGULAR VEIN. 2. IVC VENOGRAM. 3. PERCUTANEOUS IVC FILTER PLACEMENT. ANESTHESIA/SEDATION: Moderate  (conscious) sedation was employed during this procedure. A total of Versed 1.0 mg and Fentanyl 100 mcg was administered intravenously. Moderate Sedation Time: 15 minutes. The patient's level of consciousness and vital signs were monitored continuously by radiology nursing throughout the procedure under my direct supervision. CONTRAST:  CO2 gas FLUOROSCOPY TIME:  1 minute and 18 seconds.  11.8 mGy. PROCEDURE: The procedure, risks, benefits, and alternatives were explained to the patient. Questions regarding the procedure were encouraged and answered. The patient understands and consents to the procedure. A time-out was performed prior to initiating the procedure. The right neck was prepped with chlorhexidine in a sterile fashion, and a sterile drape was applied covering the operative field. A sterile gown and sterile gloves were used for the procedure. Local anesthesia was provided with 1% Lidocaine. Ultrasound was utilized to confirm patency of the right internal jugular vein. Under ultrasound image was saved and recorded. Under direct ultrasound guidance, a 21 gauge needle was advanced into the right internal jugular vein with ultrasound image documentation performed. After securing access with a micropuncture dilator, a guidewire was advanced into the inferior vena cava. A deployment sheath was advanced over the guidewire. This was utilized to perform IVC venography. The deployment sheath was further positioned in an appropriate location for filter deployment. A Bard Denali IVC filter was then advanced in the sheath. This was then fully deployed in the infrarenal IVC. Final filter position was confirmed with a fluoroscopic spot image. After the procedure the sheath was removed and hemostasis obtained with manual compression. COMPLICATIONS: None. FINDINGS: CO2 was used for IVC venography due to underlying renal insufficiency due to chronic kidney disease. IVC venography demonstrates a normal caliber IVC with no  evidence of thrombus. Renal veins are identified bilaterally. The IVC filter was successfully positioned below the level of the renal veins and is appropriately oriented. This IVC filter has both permanent and retrievable indications. IMPRESSION: Placement of percutaneous IVC filter in infrarenal IVC. IVC venogram shows no evidence of IVC thrombus and normal caliber of the inferior vena cava. This filter does have both permanent and retrievable indications. PLAN: This IVC filter is potentially retrievable. The patient will be assessed for filter retrieval by Interventional Radiology in approximately 8-12 weeks. Further recommendations regarding filter retrieval, continued surveillance or declaration of device permanence, will be made at that time. Electronically Signed   By: Irish Lack M.D.   On: 07/02/2023 13:18   DG Chest Port 1 View Result Date: 07/02/2023 CLINICAL DATA:  Short of breath.  Follow-up exam. EXAM: PORTABLE CHEST 1 VIEW COMPARISON:  06/29/2023 and older studies. FINDINGS: Cardiac silhouette stable. Bilateral, predominantly central airspace lung opacities, right greater than left, overall without significant change from the prior exam allowing for differences in patient positioning. No new lung abnormalities. Small bilateral pleural effusions. No pneumothorax. Stable left subclavian central venous catheter. IMPRESSION: 1. No significant change from the prior  study. 2. Bilateral lung opacities consistent with pulmonary edema. Electronically Signed   By: Amie Portland M.D.   On: 07/02/2023 09:12       LOS: 7 days   Billy [name] Rito Ehrlich  [facility] Pager on [email or web address]  07/03/2023, 8:52 AM
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
## 2023-07-03 NOTE — Progress Notes (Addendum)
Inpatient Rehab Admissions Coordinator:   Per therapy recommendations,  patient was screened for CIR candidacy by Clemens Catholic, MS, CCC-SLP. At this time, Pt. Appears to be a a potential candidate for CIR. I will    order for rehab consult per protocol for full assessment. Please contact me any with questions.  Clemens Catholic, [facility], [place] Admissions Coordinator  [phone] ([place]) [phone] (office)

## 2023-07-04 ENCOUNTER — Inpatient Hospital Stay (HOSPITAL_COMMUNITY)

## 2023-07-04 ENCOUNTER — Encounter (HOSPITAL_COMMUNITY): Payer: Self-pay | Admitting: Gastroenterology

## 2023-07-04 DIAGNOSIS — M7989 Other specified soft tissue disorders: Secondary | ICD-10-CM | POA: Diagnosis not present

## 2023-07-04 DIAGNOSIS — I8290 Acute embolism and thrombosis of unspecified vein: Secondary | ICD-10-CM | POA: Diagnosis not present

## 2023-07-04 DIAGNOSIS — I469 Cardiac arrest, cause unspecified: Secondary | ICD-10-CM | POA: Diagnosis not present

## 2023-07-04 DIAGNOSIS — I5030 Unspecified diastolic (congestive) heart failure: Secondary | ICD-10-CM

## 2023-07-04 DIAGNOSIS — I6529 Occlusion and stenosis of unspecified carotid artery: Secondary | ICD-10-CM

## 2023-07-04 DIAGNOSIS — I4891 Unspecified atrial fibrillation: Secondary | ICD-10-CM | POA: Diagnosis not present

## 2023-07-04 DIAGNOSIS — I5023 Acute on chronic systolic (congestive) heart failure: Secondary | ICD-10-CM | POA: Diagnosis not present

## 2023-07-04 LAB — COMPREHENSIVE METABOLIC PANEL
ALT: 182 U/L — ABNORMAL HIGH (ref 0–44)
AST: 32 U/L (ref 15–41)
Albumin: 1.8 g/dL — ABNORMAL LOW (ref 3.5–5.0)
Alkaline Phosphatase: 104 U/L (ref 38–126)
Anion gap: 10 (ref 5–15)
BUN: 45 mg/dL — ABNORMAL HIGH (ref 8–23)
CO2: 20 mmol/L — ABNORMAL LOW (ref 22–32)
Calcium: 8.3 mg/dL — ABNORMAL LOW (ref 8.9–10.3)
Chloride: 115 mmol/L — ABNORMAL HIGH (ref 98–111)
Creatinine, Ser: 1.77 mg/dL — ABNORMAL HIGH (ref 0.61–1.24)
GFR, Estimated: 41 mL/min — ABNORMAL LOW (ref >60)
Glucose, Bld: 123 mg/dL — ABNORMAL HIGH (ref 70–99)
Potassium: 3.6 mmol/L (ref 3.5–5.1)
Sodium: 145 mmol/L (ref 135–145)
Total Bilirubin: 0.9 mg/dL (ref 0.0–1.2)
Total Protein: 4.7 g/dL — ABNORMAL LOW (ref 6.5–8.1)

## 2023-07-04 LAB — COOXEMETRY PANEL
Carboxyhemoglobin: 2.2 % — ABNORMAL HIGH (ref 0.5–1.5)
Methemoglobin: <0.7 % (ref 0.0–1.5)
O2 Saturation: 82 %
Total hemoglobin: 10.3 g/dL — ABNORMAL LOW (ref 12.0–16.0)

## 2023-07-04 LAB — GLUCOSE, CAPILLARY
Glucose-Capillary: 132 mg/dL — ABNORMAL HIGH (ref 70–99)
Glucose-Capillary: 126 mg/dL — ABNORMAL HIGH (ref 70–99)
Glucose-Capillary: 107 mg/dL — ABNORMAL HIGH (ref 70–99)
Glucose-Capillary: 104 mg/dL — ABNORMAL HIGH (ref 70–99)
Glucose-Capillary: 126 mg/dL — ABNORMAL HIGH (ref 70–99)

## 2023-07-04 LAB — URINALYSIS, ROUTINE W REFLEX MICROSCOPIC
Bilirubin Urine: NEGATIVE
Glucose, UA: NEGATIVE mg/dL
Hgb urine dipstick: NEGATIVE
Ketones, ur: NEGATIVE mg/dL
Leukocytes,Ua: NEGATIVE
Nitrite: NEGATIVE
Protein, ur: NEGATIVE mg/dL
Specific Gravity, Urine: 1.01 (ref 1.005–1.030)
pH: 5 (ref 5.0–8.0)

## 2023-07-04 LAB — HEPARIN LEVEL (UNFRACTIONATED)
Heparin Unfractionated: <0.1 [IU]/mL — ABNORMAL LOW (ref 0.30–0.70)
Heparin Unfractionated: <0.1 [IU]/mL — ABNORMAL LOW (ref 0.30–0.70)
Heparin Unfractionated: 0.32 [IU]/mL (ref 0.30–0.70)

## 2023-07-04 LAB — BASIC METABOLIC PANEL
Anion gap: 9 (ref 5–15)
Anion gap: 7 (ref 5–15)
BUN: 46 mg/dL — ABNORMAL HIGH (ref 8–23)
BUN: 47 mg/dL — ABNORMAL HIGH (ref 8–23)
CO2: 21 mmol/L — ABNORMAL LOW (ref 22–32)
CO2: 22 mmol/L (ref 22–32)
Calcium: 8.3 mg/dL — ABNORMAL LOW (ref 8.9–10.3)
Calcium: 8.4 mg/dL — ABNORMAL LOW (ref 8.9–10.3)
Chloride: 114 mmol/L — ABNORMAL HIGH (ref 98–111)
Chloride: 115 mmol/L — ABNORMAL HIGH (ref 98–111)
Creatinine, Ser: 1.76 mg/dL — ABNORMAL HIGH (ref 0.61–1.24)
Creatinine, Ser: 1.76 mg/dL — ABNORMAL HIGH (ref 0.61–1.24)
GFR, Estimated: 41 mL/min — ABNORMAL LOW (ref >60)
GFR, Estimated: 41 mL/min — ABNORMAL LOW (ref >60)
Glucose, Bld: 135 mg/dL — ABNORMAL HIGH (ref 70–99)
Glucose, Bld: 147 mg/dL — ABNORMAL HIGH (ref 70–99)
Potassium: 3.9 mmol/L (ref 3.5–5.1)
Potassium: 4 mmol/L (ref 3.5–5.1)
Sodium: 144 mmol/L (ref 135–145)
Sodium: 144 mmol/L (ref 135–145)

## 2023-07-04 LAB — HEPATITIS PANEL, ACUTE
HCV Ab: NONREACTIVE
Hep A IgM: NONREACTIVE
Hep B C IgM: NONREACTIVE
Hepatitis B Surface Ag: NONREACTIVE

## 2023-07-04 LAB — CBC
HCT: 32.1 % — ABNORMAL LOW (ref 39.0–52.0)
Hemoglobin: 9.8 g/dL — ABNORMAL LOW (ref 13.0–17.0)
MCH: 24.5 pg — ABNORMAL LOW (ref 26.0–34.0)
MCHC: 30.5 g/dL (ref 30.0–36.0)
MCV: 80.3 fL (ref 80.0–100.0)
Platelets: 243 10*3/uL (ref 150–400)
RBC: 4 MIL/uL — ABNORMAL LOW (ref 4.22–5.81)
RDW: 18.8 % — ABNORMAL HIGH (ref 11.5–15.5)
WBC: 16.4 10*3/uL — ABNORMAL HIGH (ref 4.0–10.5)
nRBC: 0.6 % — ABNORMAL HIGH (ref 0.0–0.2)

## 2023-07-04 LAB — ECHOCARDIOGRAM COMPLETE
Area-P 1/2: 3.53 cm2
Calc EF: 41.5 %
Est EF: 55
S' Lateral: 2 cm
Single Plane A2C EF: 39.4 %
Single Plane A4C EF: 38.5 %
Weight: 2832.47 [oz_av]

## 2023-07-04 LAB — LACTIC ACID, PLASMA: Lactic Acid, Venous: 1.5 mmol/L (ref 0.5–1.9)

## 2023-07-04 LAB — MAGNESIUM
Magnesium: 2.2 mg/dL (ref 1.7–2.4)
Magnesium: 2.2 mg/dL (ref 1.7–2.4)

## 2023-07-04 MED ORDER — AMIODARONE IV BOLUS ONLY 150 MG/100ML: 150.0000 mg | Freq: Once | INTRAVENOUS | Status: DC

## 2023-07-04 MED ORDER — FUROSEMIDE 10 MG/ML IJ SOLN
60.0000 mg | Freq: Once | INTRAMUSCULAR | Status: AC
  Administered 2023-07-04: 60 mg via INTRAVENOUS
  Filled 2023-07-04: qty 6

## 2023-07-04 MED ORDER — AMIODARONE LOAD VIA INFUSION
150.0000 mg | Freq: Once | INTRAVENOUS | Status: AC
  Administered 2023-07-04: 150 mg via INTRAVENOUS
  Filled 2023-07-04: qty 83.34

## 2023-07-04 MED ORDER — POTASSIUM CHLORIDE CRYS ER 20 MEQ PO TBCR
40.0000 meq | EXTENDED_RELEASE_TABLET | Freq: Once | ORAL | Status: AC
  Administered 2023-07-04: 40 meq via ORAL
  Filled 2023-07-04: qty 2

## 2023-07-04 MED ORDER — SPIRONOLACTONE 12.5 MG HALF TABLET
12.5000 mg | ORAL_TABLET | Freq: Every day | ORAL | Status: DC
  Administered 2023-07-04: 12.5 mg via ORAL
  Filled 2023-07-04: qty 1

## 2023-07-04 MED ORDER — FUROSEMIDE 10 MG/ML IJ SOLN: 40.0000 mg | Freq: Once | INTRAMUSCULAR | Status: DC
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
## 2023-07-04 NOTE — Progress Notes (Signed)
 Bilateral upper extremity venous duplex has been completed. Results can be found in chart review under CV Proc.  07/04/2023 3:42 PM  Fernande Bras, RVT.

## 2023-07-04 NOTE — Progress Notes (Signed)
 PHARMACY - ANTICOAGULATION CONSULT NOTE  Pharmacy Consult for heparin Indication:  VTE/Afib  Labs: Recent Labs    07/02/23 0136 07/03/23 0332 07/03/23 1609 07/03/23 1906 07/04/23 0347  HGB 10.3* 9.8*  --   --  9.8*  HCT 33.2* 32.3*  --   --  32.1*  PLT 193 183  --   --  243  LABPROT 16.6*  --   --   --   --   INR 1.3*  --   --   --   --   HEPARINUNFRC  --   --   --  <0.10* <0.10*  CREATININE 2.05* 1.53* 1.64*  --   --    Assessment: 72yo male subtherapeutic on heparin as expected with slow titration; no infusion issues or signs of bleeding per RN.  Goal of Therapy:  Heparin level 0.3-0.5 units/ml   Plan:  Increase heparin infusion by 2-3 units/kg/hr to 1400 units/hr. Check level in 8 hours.   Vernard Gambles, PharmD, BCPS 07/04/2023 4:13 AM

## 2023-07-04 NOTE — Progress Notes (Signed)
 [facility] ADULT ICU REPLACEMENT PROTOCOL   The patient does apply for the [facility] Adult ICU Electrolyte Replacment Protocol based on the criteria listed below:   1.Exclusion criteria: TCTS, ECMO, Dialysis, and Myasthenia Gravis patients 2. Is GFR >/= 30 ml/min? Yes.    Patient's GFR today is 41 3. Is SCr </= 2? Yes.   Patient's SCr is 1.77 mg/dL 4. Did SCr increase >/= 0.5 in 24 hours? No. 5.Pt's weight >40kg  Yes.   6. Abnormal electrolyte(s): K+3.6  7. Electrolytes replaced per protocol 8.  Call MD STAT for K+ </= 2.5, Phos </= 1, or Mag </= 1 Physician:  Dr. Loralyn Freshwater, Lilia Argue 07/04/2023 4:40 AM
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
## 2023-07-04 NOTE — Progress Notes (Addendum)
 [facility] PROGRESS NOTE   Arty Lantzy WNU:[id number] DOB: 1950/11/25 DOA: 06/26/2023  PCP: [name], MD  Brief History: 73 y/o male with h/o CHF, CKD stage IV, Atrial Fibrillation, BPH, Anemia, carotid artery disease s/p endarterectomy and stenting, chornic rhinitis, HLD and RLS who went to his neighbors house after having SOB for at least 1 week and had a cardiac arrest.  Neighbor stated CPR immediately and EMS continued .  He got ROSC back after about 15 min.  Then in the ED he arrested again for about 2-5 min and then achieved ROSC.  Patient was admitted to the ICU.  Consultants: Cardiology.  Critical care medicine.  Gastroenterology.  Interventional radiology.  Procedures: IVC filter placement.  Central line placement.    Subjective/Interval History: Patient sitting up on the bed.  Complains of feeling tired but denies any chest pain.  No nausea vomiting.  No shortness of breath while at rest.    Assessment/Plan:  Cardiac arrest/cardiogenic shock/acute CHF This was witnessed and patient underwent CPR.  Cardiology is following. Patient on amiodarone and heparin infusion. Other cardiac medications include hydralazine, spironolactone. Echocardiogram showed LVEF of 45% with diffuse hypokinesis. He required pressors while he was in the ICU.  No longer now. Cardiology plans a cardiac MRI before discharge. Elevated ALT noted.  Atrial fibrillation with RVR Not on anticoagulation at home. He underwent TEE followed by DC cardioversion on 3/13.  Had initially converted to sinus rhythm but was noted to be going in and out of atrial fibrillation. Amiodarone bolus was given yesterday.  Seems to be in sinus rhythm this morning. Remains on amiodarone infusion along with heparin infusion.  Heparin was reinitiated on 3/17 after being held for hematochezia as discussed below.  Acute respiratory failure with hypoxia This is in the setting of cardiac arrest.  No history of smoking.   Currently on 5 L of oxygen by nasal cannula with saturations in the late 90s.  Continue to monitor.  Continue to wean to maintain saturations greater than 92%.  Venous thromboembolism Noted to have bilateral DVTs.  There was concern for PE due to RV failure on echocardiogram.  CT angiogram could not be done due to his AKI. He is status post IVC filter placement. Heparin to be reinitiated as per pulmonology and cardiology notes.  Seems to be tolerating well without any signs of overt bleeding.  Hemoglobin is stable. Underwent VQ scan which showed multiple defects consistent with chest x-ray but non-diagnostic.   Hematochezia Seen by gastroenterology.  Underwent EGD which showed duodenitis.  Not a candidate for colonoscopy due to his tenuous cardiac and respiratory status.   Has not had any bleeding in the last 3 days.  Hemoglobin is stable.  Heparin was reinitiated yesterday.    Acute kidney injury on chronic kidney disease stage IIIb Suspect ATN in the setting of cardiac arrest and hypotension.  Renal function was gradually improving but noted to be higher the last couple of days.  Monitor urine output.  No UA noted which will be ordered.  Renal ultrasound will be ordered as well.  Hypokalemia Monitor daily.  Monitor magnesium levels as well.  Acute blood loss anemia Hemoglobin 9.8 today and stable.  Monitor closely.  Concern for pneumonia Had elevated procalcitonin level of 8.37.  He has completed course of ceftriaxone and doxycycline.  Coronary artery disease/NSTEMI Could have been demand ischemia though is uncertain.  He was on Plavix which was held due to GI bleed.  He is on  statin. Management per cardiology.  Previous history of stroke/carotid artery disease He is status post TCAR in June 2024.  Concern for dysphagia Noted to be NPO.  SLP evaluation is pending.  Foley catheter in situ Voiding trial once he is more stable.  DVT Prophylaxis: IV heparin Code Status: Full  code Family Communication: Daughter was updated yesterday.  Will do so again today. Disposition Plan: CIR is being considered.  Not ready for discharge yet.    Medications: Scheduled:  atorvastatin  80 mg Oral Daily   Chlorhexidine Gluconate Cloth  6 each Topical Daily   ezetimibe  10 mg Oral Daily   feeding supplement  237 mL Oral TID BM   hydrALAZINE  100 mg Oral Q8H   insulin aspart  0-9 Units Subcutaneous Q4H   pantoprazole (PROTONIX) IV  40 mg Intravenous Q12H   potassium chloride  40 mEq Oral Once   sertraline  50 mg Oral QHS   sodium chloride flush  10-40 mL Intracatheter Q12H   spironolactone  12.5 mg Oral Daily   Continuous:  amiodarone 60 mg/hr (07/04/23 1126)   heparin 1,400 Units/hr (07/04/23 1008)   GEX:BMWUXLKG sodium, hydrALAZINE, HYDROmorphone (DILAUDID) injection, influenza vaccine adjuvanted, iohexol, iohexol, labetalol, mouth rinse, pneumococcal 20-valent conjugate vaccine, polyethylene glycol, sodium chloride flush  Antibiotics: Anti-infectives (From admission, onward)    Start     Dose/Rate Route Frequency Ordered Stop   06/29/23 1000  cefTRIAXone (ROCEPHIN) 2 g in sodium chloride 0.9 % 100 mL IVPB        2 g 200 mL/hr over 30 Minutes Intravenous Every 24 hours 06/28/23 1015 07/01/23 1008   06/28/23 0915  Ampicillin-Sulbactam (UNASYN) 3 g in sodium chloride 0.9 % 100 mL IVPB  Status:  Discontinued        3 g 200 mL/hr over 30 Minutes Intravenous Every 12 hours 06/28/23 0822 06/28/23 1015   06/27/23 0915  cefTRIAXone (ROCEPHIN) 2 g in sodium chloride 0.9 % 100 mL IVPB  Status:  Discontinued        2 g 200 mL/hr over 30 Minutes Intravenous Every 24 hours 06/27/23 0828 06/28/23 0822   06/27/23 0915  doxycycline (VIBRAMYCIN) 100 mg in sodium chloride 0.9 % 250 mL IVPB        100 mg 125 mL/hr over 120 Minutes Intravenous Every 12 hours 06/27/23 0828 07/01/23 2310       Objective:  Vital Signs  Vitals:   07/04/23 0400 07/04/23 0500 07/04/23 0600  07/04/23 0700  BP: (!) 160/63 (!) 159/58 (!) 129/50 (!) 167/72  Pulse: 73 73 71 99  Resp: 16 19 17  (!) 22  Temp: 98.6 F (37 C) 98.6 F (37 C) 98.4 F (36.9 C) 98.6 F (37 C)  TempSrc:      SpO2: 95% 95% 96% (!) 89%  Weight:  83.2 kg    Height:        Intake/Output Summary (Last 24 hours) at 07/04/2023 1128 Last data filed at 07/04/2023 0700 Gross per 24 hour  Intake 2531.74 ml  Output 2070 ml  Net 461.74 ml   Filed Weights   07/02/23 0407 07/03/23 0348 07/04/23 0500  Weight: 83.7 kg 83.1 kg 83.2 kg    General appearance: Awake alert.  In no distress.  Appears to be fatigued. Resp: Mildly tachypneic.  Coarse breath sounds with few crackles at the bases.  No wheezing or rhonchi. Cardio: S1-S2 is normal regular.  No S3-S4.  No rubs murmurs or bruit GI: Abdomen is soft.  Nontender nondistended.  Bowel sounds are present normal.  No masses organomegaly Extremities: Edema bilateral lower extremities No obvious focal neurological deficits.  Lab Results:  Data Reviewed: I have personally reviewed following labs and reports of the imaging studies  CBC: Recent Labs  Lab 07/01/23 0500 07/01/23 1557 07/02/23 0136 07/03/23 0332 07/04/23 0347  WBC 17.7* 16.3* 16.8* 14.8* 16.4*  HGB 9.9* 9.8* 10.3* 9.8* 9.8*  HCT 31.7* 31.5* 33.2* 32.3* 32.1*  MCV 78.1* 79.3* 79.4* 80.5 80.3  PLT 195 172 193 183 243    Basic Metabolic Panel: Recent Labs  Lab 06/28/23 0350 06/29/23 0408 06/29/23 1221 06/30/23 0438 06/30/23 1717 07/01/23 0500 07/02/23 0136 07/03/23 0332 07/03/23 1609 07/04/23 0347  NA 143 142   < > 142   < > 146* 149* 145 146* 145  K 3.5 3.0*   < > 3.1*   < > 3.7 3.7 3.3* 3.5 3.6  CL 108 109   < > 110   < > 114* 114* 114* 113* 115*  CO2 20* 22   < > 20*   < > 22 21* 21* 22 20*  GLUCOSE 150* 123*   < > 122*   < > 108* 159* 132* 131* 123*  BUN 55* 57*   < > 57*   < > 51* 51* 42* 41* 45*  CREATININE 2.70* 2.70*   < > 2.55*   < > 2.28* 2.05* 1.53* 1.64* 1.77*   CALCIUM 7.7* 7.6*   < > 7.6*   < > 8.1* 8.5* 8.0* 8.4* 8.3*  MG 1.7 1.9  --  2.0  --  2.1 2.2 2.0 2.4  --   PHOS 3.3 3.6  --  3.0  --  3.1  --   --   --   --    < > = values in this interval not displayed.    GFR: Estimated Creatinine Clearance: 43.9 mL/min (A) (by C-G formula based on SCr of 1.77 mg/dL (H)).  Liver Function Tests: Recent Labs  Lab 07/04/23 0347  AST 32  ALT 182*  ALKPHOS 104  BILITOT 0.9  PROT 4.7*  ALBUMIN 1.8*    Coagulation Profile: Recent Labs  Lab 07/02/23 0136  INR 1.3*   CBG: Recent Labs  Lab 07/03/23 1927 07/03/23 2345 07/04/23 0404 07/04/23 0745 07/04/23 1107  GLUCAP 103* 120* 132* 126* 107*    Recent Results (from the past 240 hours)  MRSA Next Gen by PCR, Nasal     Status: None   Collection Time: 06/26/23 10:34 PM   Specimen: Nasal Mucosa; Nasal Swab  Result Value Ref Range Status   MRSA by PCR Next Gen NOT DETECTED NOT DETECTED Final    Comment: (NOTE) The GeneXpert MRSA Assay (FDA approved for NASAL specimens only), is one component of a comprehensive MRSA colonization surveillance program. It is not intended to diagnose MRSA infection nor to guide or monitor treatment for MRSA infections. Test performance is not FDA approved in patients less than 13 years old. Performed at [facility], [address], [place], [place] [address]   Respiratory (~20 pathogens) panel by PCR     Status: None   Collection Time: 06/27/23 10:14 AM   Specimen: Nasopharyngeal Swab; Respiratory  Result Value Ref Range Status   Adenovirus NOT DETECTED NOT DETECTED Final   Coronavirus 229E NOT DETECTED NOT DETECTED Final    Comment: (NOTE) The Coronavirus on the Respiratory Panel, DOES NOT test for the novel  Coronavirus (2019 nCoV)  Coronavirus HKU1 NOT DETECTED NOT DETECTED Final   Coronavirus NL63 NOT DETECTED NOT DETECTED Final   Coronavirus OC43 NOT DETECTED NOT DETECTED Final   Metapneumovirus NOT DETECTED NOT DETECTED Final    Rhinovirus / Enterovirus NOT DETECTED NOT DETECTED Final   Influenza A NOT DETECTED NOT DETECTED Final   Influenza B NOT DETECTED NOT DETECTED Final   Parainfluenza Virus 1 NOT DETECTED NOT DETECTED Final   Parainfluenza Virus 2 NOT DETECTED NOT DETECTED Final   Parainfluenza Virus 3 NOT DETECTED NOT DETECTED Final   Parainfluenza Virus 4 NOT DETECTED NOT DETECTED Final   Respiratory Syncytial Virus NOT DETECTED NOT DETECTED Final   Bordetella pertussis NOT DETECTED NOT DETECTED Final   Bordetella Parapertussis NOT DETECTED NOT DETECTED Final   Chlamydophila pneumoniae NOT DETECTED NOT DETECTED Final   Mycoplasma pneumoniae NOT DETECTED NOT DETECTED Final    Comment: Performed at [facility], [address] [place], [place] [address]  Resp panel by RT-PCR (RSV, Flu A&B, Covid) Urine, Catheterized     Status: None   Collection Time: 06/27/23 10:14 AM   Specimen: Urine, Catheterized; Nasal Swab  Result Value Ref Range Status   SARS Coronavirus 2 by RT PCR NEGATIVE NEGATIVE Final   Influenza A by PCR NEGATIVE NEGATIVE Final   Influenza B by PCR NEGATIVE NEGATIVE Final    Comment: (NOTE) The Xpert Xpress SARS-CoV-2/FLU/RSV plus assay is intended as an aid in the diagnosis of influenza from Nasopharyngeal swab specimens and should not be used as a sole basis for treatment. Nasal washings and aspirates are unacceptable for Xpert Xpress SARS-CoV-2/FLU/RSV testing.  Fact Sheet for Patients: BloggerCourse.com  Fact Sheet for Healthcare Providers: SeriousBroker.it  This test is not yet approved or cleared by the [place] FDA and has been authorized for detection and/or diagnosis of SARS-CoV-2 by FDA under an Emergency Use Authorization (EUA). This EUA will remain in effect (meaning this test can be used) for the duration of the COVID-19 declaration under Section 564(b)(1) of the Act, 21 U.S.C. section 360bbb-3(b)(1), unless the  authorization is terminated or revoked.     Resp Syncytial Virus by PCR NEGATIVE NEGATIVE Final    Comment: (NOTE) Fact Sheet for Patients: BloggerCourse.com  Fact Sheet for Healthcare Providers: SeriousBroker.it  This test is not yet approved or cleared by the [place] FDA and has been authorized for detection and/or diagnosis of SARS-CoV-2 by FDA under an Emergency Use Authorization (EUA). This EUA will remain in effect (meaning this test can be used) for the duration of the COVID-19 declaration under Section 564(b)(1) of the Act, 21 U.S.C. section 360bbb-3(b)(1), unless the authorization is terminated or revoked.  Performed at [facility], [address] [place], [place] [address]   Culture, Respiratory w Gram Stain     Status: None   Collection Time: 06/27/23 10:51 AM   Specimen: Tracheal Aspirate; Respiratory  Result Value Ref Range Status   Specimen Description TRACHEAL ASPIRATE  Final   Special Requests NONE  Final   Gram Stain   Final    FEW WBC PRESENT,BOTH PMN AND MONONUCLEAR RARE GRAM POSITIVE COCCI IN PAIRS IN CLUSTERS    Culture   Final    RARE Normal respiratory flora-no Staph aureus or Pseudomonas seen Performed at [facility], [address], [place], [place] [address]    Report Status 06/29/2023 FINAL  Final      Radiology Studies: DG Chest 2 View Result Date: 07/03/2023 CLINICAL DATA:  Dyspnea EXAM: CHEST - 2 VIEW COMPARISON:  07/02/2023 FINDINGS: Frontal and lateral views of the chest demonstrate persistent areas of consolidation bilaterally, most pronounced within the right upper and left lower lobes. There are small bilateral pleural effusions again noted. No pneumothorax. The cardiac silhouette is stable. Left subclavian central venous catheter tip in the region of the superior vena cava. IMPRESSION: 1. Stable bilateral airspace disease and small bilateral pleural effusions.  Electronically Signed   By: Sharlet Salina M.D.   On: 07/03/2023 16:42   NM Pulmonary Perfusion Result Date: 07/03/2023 CLINICAL DATA:  Deep venous thrombosis, dyspnea, right heart failure EXAM: NUCLEAR MEDICINE PERFUSION LUNG SCAN TECHNIQUE: Perfusion images were obtained in multiple projections after intravenous injection of radiopharmaceutical. Ventilation scans intentionally deferred if perfusion scan and chest x-ray adequate for interpretation. RADIOPHARMACEUTICALS:  4.13 mCi Tc-34m MAA IV COMPARISON:  Chest x-ray 07/03/2023 FINDINGS: Planar images of the chest are obtained in multiple projections. There is a large multi segmental area of decreased perfusion involving the right upper lobe, equal in size to the corresponding right upper lobe opacity on chest x-ray. Additionally, there is segmental perfusion defect within the medial basilar left lower lobe, equal in size to the opacity on chest x-ray. There are no mismatched perfusion defects in comparison to the x-ray exam. IMPRESSION: 1. Multiple perfusion defects which are matched in size to the corresponding chest x-ray findings, compatible with nondiagnostic (low to intermediate probability) exam based on modified PIOPED II criteria. Electronically Signed   By: Sharlet Salina M.D.   On: 07/03/2023 16:41   IR IVC FILTER PLMT / S&I /IMG GUID/MOD SED Result Date: 07/02/2023 CLINICAL DATA:  Left lower extremity DVT, GI bleed and cardiac arrest. Need for IVC filter due to contraindication to anticoagulation currently. EXAM: 1. ULTRASOUND GUIDANCE FOR VASCULAR ACCESS OF THE RIGHT INTERNAL JUGULAR VEIN. 2. IVC VENOGRAM. 3. PERCUTANEOUS IVC FILTER PLACEMENT. ANESTHESIA/SEDATION: Moderate (conscious) sedation was employed during this procedure. A total of Versed 1.0 mg and Fentanyl 100 mcg was administered intravenously. Moderate Sedation Time: 15 minutes. The patient's level of consciousness and vital signs were monitored continuously by radiology nursing  throughout the procedure under my direct supervision. CONTRAST:  CO2 gas FLUOROSCOPY TIME:  1 minute and 18 seconds.  11.8 mGy. PROCEDURE: The procedure, risks, benefits, and alternatives were explained to the patient. Questions regarding the procedure were encouraged and answered. The patient understands and consents to the procedure. A time-out was performed prior to initiating the procedure. The right neck was prepped with chlorhexidine in a sterile fashion, and a sterile drape was applied covering the operative field. A sterile gown and sterile gloves were used for the procedure. Local anesthesia was provided with 1% Lidocaine. Ultrasound was utilized to confirm patency of the right internal jugular vein. Under ultrasound image was saved and recorded. Under direct ultrasound guidance, a 21 gauge needle was advanced into the right internal jugular vein with ultrasound image documentation performed. After securing access with a micropuncture dilator, a guidewire was advanced into the inferior vena cava. A deployment sheath was advanced over the guidewire. This was utilized to perform IVC venography. The deployment sheath was further positioned in an appropriate location for filter deployment. A Bard Denali IVC filter was then advanced in the sheath. This was then fully deployed in the infrarenal IVC. Final filter position was confirmed with a fluoroscopic spot image. After the procedure the sheath was removed and hemostasis obtained with manual compression. COMPLICATIONS: None. FINDINGS: CO2 was used for IVC venography  due to underlying renal insufficiency due to chronic kidney disease. IVC venography demonstrates a normal caliber IVC with no evidence of thrombus. Renal veins are identified bilaterally. The IVC filter was successfully positioned below the level of the renal veins and is appropriately oriented. This IVC filter has both permanent and retrievable indications. IMPRESSION: Placement of percutaneous IVC  filter in infrarenal IVC. IVC venogram shows no evidence of IVC thrombus and normal caliber of the inferior vena cava. This filter does have both permanent and retrievable indications. PLAN: This IVC filter is potentially retrievable. The patient will be assessed for filter retrieval by Interventional Radiology in approximately 8-12 weeks. Further recommendations regarding filter retrieval, continued surveillance or declaration of device permanence, will be made at that time. Electronically Signed   By: Irish Lack M.D.   On: 07/02/2023 13:18       LOS: 8 days   Zahraa Bhargava Rito Ehrlich  [facility] Pager on [email or web address]  07/04/2023, 11:28 AM
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
## 2023-07-04 NOTE — Progress Notes (Signed)
 PT Cancellation Note  Patient Details Name: Billy [name] MRN: [id number] DOB: [birth year]   Cancelled Treatment:    Reason Eval/Treat Not Completed: Medical issues which prohibited therapy  Patient initially with HR 106-118 as observed from outside room. Upon entering room and pt beginning to speak with PT, HR up to 132 and maintained 120-132 bpm. Patient mildly short of breath and reporting he feels exhausted and requested to defer PT at this time. Based on his symptoms and elevated HR, agreed to defer and pt agreeable to PT returning later if schedule permits.    Jerolyn Center, PT Acute Rehabilitation Services  Office [phone]  Zena Amos 07/04/2023, 2:29 PM
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
## 2023-07-05 ENCOUNTER — Encounter (HOSPITAL_COMMUNITY)
Admission: EM | Disposition: A | Discharge status: Skilled Nursing Facility | Payer: Self-pay | Source: Home / Self Care | Attending: Internal Medicine

## 2023-07-05 ENCOUNTER — Other Ambulatory Visit: Payer: Self-pay

## 2023-07-05 DIAGNOSIS — I2510 Atherosclerotic heart disease of native coronary artery without angina pectoris: Secondary | ICD-10-CM | POA: Diagnosis not present

## 2023-07-05 DIAGNOSIS — I10 Essential (primary) hypertension: Secondary | ICD-10-CM

## 2023-07-05 DIAGNOSIS — N1832 Chronic kidney disease, stage 3b: Secondary | ICD-10-CM | POA: Diagnosis not present

## 2023-07-05 DIAGNOSIS — I5033 Acute on chronic diastolic (congestive) heart failure: Secondary | ICD-10-CM

## 2023-07-05 DIAGNOSIS — I480 Paroxysmal atrial fibrillation: Secondary | ICD-10-CM | POA: Diagnosis not present

## 2023-07-05 DIAGNOSIS — I469 Cardiac arrest, cause unspecified: Secondary | ICD-10-CM | POA: Diagnosis not present

## 2023-07-05 DIAGNOSIS — E782 Mixed hyperlipidemia: Secondary | ICD-10-CM

## 2023-07-05 HISTORY — PX: RIGHT/LEFT HEART CATH AND CORONARY ANGIOGRAPHY: CATH118266

## 2023-07-05 LAB — COOXEMETRY PANEL
Carboxyhemoglobin: 2.4 % — ABNORMAL HIGH (ref 0.5–1.5)
Methemoglobin: <0.7 % (ref 0.0–1.5)
O2 Saturation: 79.4 %
Total hemoglobin: 9.7 g/dL — ABNORMAL LOW (ref 12.0–16.0)

## 2023-07-05 LAB — COMPREHENSIVE METABOLIC PANEL
ALT: 115 U/L — ABNORMAL HIGH (ref 0–44)
AST: 24 U/L (ref 15–41)
Albumin: 1.8 g/dL — ABNORMAL LOW (ref 3.5–5.0)
Alkaline Phosphatase: 80 U/L (ref 38–126)
Anion gap: 8 (ref 5–15)
BUN: 47 mg/dL — ABNORMAL HIGH (ref 8–23)
CO2: 22 mmol/L (ref 22–32)
Calcium: 8.5 mg/dL — ABNORMAL LOW (ref 8.9–10.3)
Chloride: 112 mmol/L — ABNORMAL HIGH (ref 98–111)
Creatinine, Ser: 1.75 mg/dL — ABNORMAL HIGH (ref 0.61–1.24)
GFR, Estimated: 41 mL/min — ABNORMAL LOW (ref >60)
Glucose, Bld: 123 mg/dL — ABNORMAL HIGH (ref 70–99)
Potassium: 4.1 mmol/L (ref 3.5–5.1)
Sodium: 142 mmol/L (ref 135–145)
Total Bilirubin: 0.8 mg/dL (ref 0.0–1.2)
Total Protein: 4.6 g/dL — ABNORMAL LOW (ref 6.5–8.1)

## 2023-07-05 LAB — POCT I-STAT EG7
Acid-base deficit: 4 mmol/L — ABNORMAL HIGH (ref 0.0–2.0)
Acid-base deficit: 8 mmol/L — ABNORMAL HIGH (ref 0.0–2.0)
Bicarbonate: 17 mmol/L — ABNORMAL LOW (ref 20.0–28.0)
Bicarbonate: 20.6 mmol/L (ref 20.0–28.0)
Calcium, Ion: 0.85 mmol/L — CL (ref 1.15–1.40)
Calcium, Ion: 1.12 mmol/L — ABNORMAL LOW (ref 1.15–1.40)
HCT: 23 % — ABNORMAL LOW (ref 39.0–52.0)
HCT: 28 % — ABNORMAL LOW (ref 39.0–52.0)
Hemoglobin: 7.8 g/dL — ABNORMAL LOW (ref 13.0–17.0)
Hemoglobin: 9.5 g/dL — ABNORMAL LOW (ref 13.0–17.0)
O2 Saturation: 63 %
O2 Saturation: 66 %
Potassium: 3 mmol/L — ABNORMAL LOW (ref 3.5–5.1)
Potassium: 3.8 mmol/L (ref 3.5–5.1)
Sodium: 145 mmol/L (ref 135–145)
Sodium: 150 mmol/L — ABNORMAL HIGH (ref 135–145)
TCO2: 18 mmol/L — ABNORMAL LOW (ref 22–32)
TCO2: 22 mmol/L (ref 22–32)
pCO2, Ven: 30.7 mmHg — ABNORMAL LOW (ref 44–60)
pCO2, Ven: 35.2 mmHg — ABNORMAL LOW (ref 44–60)
pH, Ven: 7.352 (ref 7.25–7.43)
pH, Ven: 7.375 (ref 7.25–7.43)
pO2, Ven: 33 mmHg (ref 32–45)
pO2, Ven: 35 mmHg (ref 32–45)

## 2023-07-05 LAB — POCT I-STAT 7, (LYTES, BLD GAS, ICA,H+H)
Acid-base deficit: 4 mmol/L — ABNORMAL HIGH (ref 0.0–2.0)
Bicarbonate: 20.1 mmol/L (ref 20.0–28.0)
Calcium, Ion: 1.22 mmol/L (ref 1.15–1.40)
HCT: 29 % — ABNORMAL LOW (ref 39.0–52.0)
Hemoglobin: 9.9 g/dL — ABNORMAL LOW (ref 13.0–17.0)
O2 Saturation: 96 %
Potassium: 4.1 mmol/L (ref 3.5–5.1)
Sodium: 144 mmol/L (ref 135–145)
TCO2: 21 mmol/L — ABNORMAL LOW (ref 22–32)
pCO2 arterial: 32.9 mmHg (ref 32–48)
pH, Arterial: 7.395 (ref 7.35–7.45)
pO2, Arterial: 81 mmHg — ABNORMAL LOW (ref 83–108)

## 2023-07-05 LAB — CBC
HCT: 29.5 % — ABNORMAL LOW (ref 39.0–52.0)
Hemoglobin: 8.9 g/dL — ABNORMAL LOW (ref 13.0–17.0)
MCH: 24.4 pg — ABNORMAL LOW (ref 26.0–34.0)
MCHC: 30.2 g/dL (ref 30.0–36.0)
MCV: 80.8 fL (ref 80.0–100.0)
Platelets: 280 10*3/uL (ref 150–400)
RBC: 3.65 MIL/uL — ABNORMAL LOW (ref 4.22–5.81)
RDW: 19.1 % — ABNORMAL HIGH (ref 11.5–15.5)
WBC: 17.9 10*3/uL — ABNORMAL HIGH (ref 4.0–10.5)
nRBC: 0.3 % — ABNORMAL HIGH (ref 0.0–0.2)

## 2023-07-05 LAB — GLUCOSE, CAPILLARY
Glucose-Capillary: 113 mg/dL — ABNORMAL HIGH (ref 70–99)
Glucose-Capillary: 116 mg/dL — ABNORMAL HIGH (ref 70–99)
Glucose-Capillary: 117 mg/dL — ABNORMAL HIGH (ref 70–99)
Glucose-Capillary: 117 mg/dL — ABNORMAL HIGH (ref 70–99)
Glucose-Capillary: 122 mg/dL — ABNORMAL HIGH (ref 70–99)
Glucose-Capillary: 86 mg/dL (ref 70–99)
Glucose-Capillary: 98 mg/dL (ref 70–99)

## 2023-07-05 LAB — MAGNESIUM: Magnesium: 2.2 mg/dL (ref 1.7–2.4)

## 2023-07-05 LAB — HEMOGLOBIN AND HEMATOCRIT, BLOOD
HCT: 30.6 % — ABNORMAL LOW (ref 39.0–52.0)
Hemoglobin: 9.2 g/dL — ABNORMAL LOW (ref 13.0–17.0)

## 2023-07-05 LAB — HEPARIN LEVEL (UNFRACTIONATED): Heparin Unfractionated: 0.12 [IU]/mL — ABNORMAL LOW (ref 0.30–0.70)

## 2023-07-05 SURGERY — RIGHT/LEFT HEART CATH AND CORONARY ANGIOGRAPHY
Anesthesia: LOCAL

## 2023-07-05 MED ORDER — HEPARIN SODIUM (PORCINE) 1000 UNIT/ML IJ SOLN
INTRAMUSCULAR | Status: DC | PRN
  Administered 2023-07-05: 3000 [IU] via INTRAVENOUS

## 2023-07-05 MED ORDER — HEPARIN SODIUM (PORCINE) 1000 UNIT/ML IJ SOLN
INTRAMUSCULAR | Status: AC
  Filled 2023-07-05: qty 10

## 2023-07-05 MED ORDER — SODIUM CHLORIDE 0.9 % IV SOLN: INTRAVENOUS | Status: DC

## 2023-07-05 MED ORDER — FENTANYL CITRATE (PF) 100 MCG/2ML IJ SOLN
INTRAMUSCULAR | Status: DC | PRN
  Administered 2023-07-05: 25 ug via INTRAVENOUS

## 2023-07-05 MED ORDER — ASPIRIN 81 MG PO CHEW
81.0000 mg | CHEWABLE_TABLET | ORAL | Status: AC
  Administered 2023-07-05: 81 mg via ORAL
  Filled 2023-07-05: qty 1

## 2023-07-05 MED ORDER — LIDOCAINE HCL (PF) 1 % IJ SOLN
INTRAMUSCULAR | Status: DC | PRN
  Administered 2023-07-05 (×2): 2 mL

## 2023-07-05 MED ORDER — VERAPAMIL HCL 2.5 MG/ML IV SOLN
INTRAVENOUS | Status: AC
  Filled 2023-07-05: qty 2

## 2023-07-05 MED ORDER — SODIUM CHLORIDE 0.9 % IV SOLN: 250.0000 mL | INTRAVENOUS | Status: AC | PRN

## 2023-07-05 MED ORDER — SODIUM CHLORIDE 0.9 % IV SOLN: INTRAVENOUS | Status: AC

## 2023-07-05 MED ORDER — LIDOCAINE HCL (PF) 1 % IJ SOLN
INTRAMUSCULAR | Status: AC
  Filled 2023-07-05: qty 30

## 2023-07-05 MED ORDER — VERAPAMIL HCL 2.5 MG/ML IV SOLN
INTRAVENOUS | Status: DC | PRN
  Administered 2023-07-05: 10 mL via INTRA_ARTERIAL

## 2023-07-05 MED ORDER — SODIUM CHLORIDE 0.9% FLUSH: 3.0000 mL | INTRAVENOUS | Status: DC | PRN

## 2023-07-05 MED ORDER — ASPIRIN 81 MG PO CHEW: 81.0000 mg | CHEWABLE_TABLET | ORAL | Status: DC

## 2023-07-05 MED ORDER — ACETAMINOPHEN 325 MG PO TABS
650.0000 mg | ORAL_TABLET | ORAL | Status: DC | PRN
  Administered 2023-07-05: 650 mg via ORAL
  Filled 2023-07-05: qty 2

## 2023-07-05 MED ORDER — HEPARIN (PORCINE) 25000 UT/250ML-% IV SOLN
800.0000 [IU]/h | INTRAVENOUS | Status: DC
  Administered 2023-07-05: 800 [IU]/h via INTRAVENOUS
  Filled 2023-07-05: qty 250

## 2023-07-05 MED ORDER — HYDRALAZINE HCL 20 MG/ML IJ SOLN
10.0000 mg | INTRAMUSCULAR | Status: AC | PRN
  Administered 2023-07-05: 10 mg via INTRAVENOUS
  Filled 2023-07-05: qty 1

## 2023-07-05 MED ORDER — IOHEXOL 350 MG/ML SOLN
INTRAVENOUS | Status: DC | PRN
  Administered 2023-07-05: 35 mL via INTRA_ARTERIAL

## 2023-07-05 MED ORDER — LIDOCAINE 5 % EX PTCH
1.0000 | MEDICATED_PATCH | CUTANEOUS | Status: DC
Start: 2023-07-05 — End: 2023-07-20
  Administered 2023-07-05 – 2023-07-20 (×11): 1 via TRANSDERMAL
  Filled 2023-07-05 (×13): qty 1

## 2023-07-05 MED ORDER — SPIRONOLACTONE 25 MG PO TABS
25.0000 mg | ORAL_TABLET | Freq: Every day | ORAL | Status: DC
  Administered 2023-07-05 – 2023-07-09 (×5): 25 mg via ORAL
  Filled 2023-07-05 (×5): qty 1

## 2023-07-05 MED ORDER — HEPARIN (PORCINE) IN NACL 2000-0.9 UNIT/L-% IV SOLN
INTRAVENOUS | Status: DC | PRN
  Administered 2023-07-05 (×2): 1000 mL

## 2023-07-05 MED ORDER — FENTANYL CITRATE (PF) 100 MCG/2ML IJ SOLN
INTRAMUSCULAR | Status: AC
  Filled 2023-07-05: qty 2

## 2023-07-05 MED ORDER — SODIUM CHLORIDE 0.9% FLUSH
3.0000 mL | Freq: Two times a day (BID) | INTRAVENOUS | Status: DC
  Administered 2023-07-05 – 2023-07-18 (×20): 3 mL via INTRAVENOUS

## 2023-07-05 MED ORDER — ONDANSETRON HCL 4 MG/2ML IJ SOLN
4.0000 mg | Freq: Four times a day (QID) | INTRAMUSCULAR | Status: DC | PRN
Start: 2023-07-05 — End: 2023-07-20
  Administered 2023-07-06 – 2023-07-08 (×3): 4 mg via INTRAVENOUS
  Filled 2023-07-05 (×3): qty 2

## 2023-07-05 SURGICAL SUPPLY — 13 items
CATH 5FR JL3.5 JR4 ANG PIG MP (CATHETERS) IMPLANT
CATH SWAN GANZ 7F STRAIGHT (CATHETERS) IMPLANT
DEVICE RAD COMP TR BAND LRG (VASCULAR PRODUCTS) IMPLANT
GLIDESHEATH SLEND SS 6F .021 (SHEATH) IMPLANT
GUIDEWIRE INQWIRE 1.5J.035X260 (WIRE) IMPLANT
INQWIRE 1.5J .035X260CM (WIRE) ×1 IMPLANT
KIT HEART LEFT (KITS) IMPLANT
KIT MICROPUNCTURE NIT STIFF (SHEATH) IMPLANT
PACK CARDIAC CATHETERIZATION (CUSTOM PROCEDURE TRAY) ×1 IMPLANT
SET ATX-X65L (MISCELLANEOUS) IMPLANT
SHEATH PINNACLE 7F 10CM (SHEATH) IMPLANT
SHEATH PROBE COVER 6X72 (BAG) IMPLANT
TRANSDUCER W/STOPCOCK (MISCELLANEOUS) IMPLANT
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
## 2023-07-05 NOTE — Assessment & Plan Note (Addendum)
 Endoscopy with erosive gastropathy with no bleeding and no stigmata of recent bleeding.  Bleeding friable duodenal mucosa. Hemostatic spry applied.  Patient will need colonic evaluation, currently not candidate for colonoscopy, but will benefit from CT scan.  If further drop in hgb will obtain scan as inpatient.   Acute blood loss anemia due to upper and possible lower GI  bleeding.   Patient continue to have hematochezia while on anticoagulation.  Follow up Hgb 7.7 No further evidence of active bleeding, clinically and per CT.   Plan to continue pantoprazole Low dose heparin infusion.  Hgb this am 7,6  He had brown red bowel movement, will check H&H this pm.
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
## 2023-07-05 NOTE — Interval H&P Note (Signed)
 History and Physical Interval Note:  07/05/2023 4:08 PM  Billy [name]  has presented today for surgery, with the diagnosis of hf.  The various methods of treatment have been discussed with the patient and family. After consideration of risks, benefits and other options for treatment, the patient has consented to  Procedure(s): RIGHT/LEFT HEART CATH AND CORONARY ANGIOGRAPHY (N/A) as a surgical intervention.  The patient's history has been reviewed, patient examined, no change in status, stable for surgery.  I have reviewed the patient's chart and labs.  Questions were answered to the patient's satisfaction.     Kennth Vanbenschoten
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
## 2023-07-05 NOTE — Plan of Care (Signed)
  Problem: Coping: Goal: Ability to adjust to condition or change in health will improve Outcome: Progressing   Problem: Health Behavior/Discharge Planning: Goal: Ability to manage health-related needs will improve Outcome: Progressing   Problem: Metabolic: Goal: Ability to maintain appropriate glucose levels will improve Outcome: Progressing   Problem: Nutritional: Goal: Maintenance of adequate nutrition will improve Outcome: Progressing   Problem: Tissue Perfusion: Goal: Adequacy of tissue perfusion will improve Outcome: Progressing   Problem: Education: Goal: Knowledge of General Education information will improve Description: Including pain rating scale, medication(s)/side effects and non-pharmacologic comfort measures Outcome: Progressing   Problem: Clinical Measurements: Goal: Respiratory complications will improve Outcome: Progressing   Problem: Elimination: Goal: Will not experience complications related to bowel motility Outcome: Progressing

## 2023-07-05 NOTE — Assessment & Plan Note (Signed)
Continue blood pressure control with hydralazine.

## 2023-07-05 NOTE — Assessment & Plan Note (Signed)
 Continue statin therapy.

## 2023-07-05 NOTE — Assessment & Plan Note (Addendum)
 AKI, hypernatremia.   Worsening renal function with serum cr at 2,38 with K at 4,0 and serum bicarbonate at 28  Na 139   Plan to continue close monitoring renal function and electrolytes.  Holding diuretic therapy for now.

## 2023-07-05 NOTE — Progress Notes (Signed)
 Paged with asx sustained VT in patient with AF and on amio at 1 mg/min. Additional amio 150 mg IV given. Reviewed amio which was started 03/16 and has been mostly at 1 mg/min (short period of 0.5 mg/min) on 03/16 for the past 2 days. ~6 gm IV with boluses, equiv 12 gm PO load. Reviewed telemetry and tachycardia more c/w SVT with aberrant LBBB than VT. Converted to NSR now. Patient asx throughout. Continue on current amio load, likely drop rate in morning and/or convert to PO maintenance if remains in NSR.

## 2023-07-05 NOTE — Assessment & Plan Note (Addendum)
 Echocardiogram with preserved LV systolic function with EF 55%, severe LVH, interventricular septum is flattened in systole and diastole, RV systolic function with severe reduction, RA size with mild enlargement, small pericardial effusion.   03/19 cardiac catheterization  3 vessel coronary artery disease with CTO RCA with L > R collaterals and moderate non obstructive coronary artery disease in the left system.  RA 9  RV 24/16 PA 40/31 mean 30  PCWP 14  Cardiac output 7,4 and index 3,5 Fick, 7.3 and 3.5 thermodilution.  PVR 2.2 WU   Acute on chronic core pulmonale Pulmonary hypertension  RV failure   Urine output is 4,150  ml. Systolic blood pressure 120 mmHg range.   Plan to continue hydralazine and spironolactone.  Loop diuretic with torsemide 40 mg daily.   Recovered cardiac arrest, cardiogenic shock. (Resolved) 03/21 Cardiac MRI not consistent with cardiac amyloid.   Acute hypoxemic respiratory failure due to cardiogenic pulmonary edema.  Oxygenation has improved, oxygenation is 95% on 1 L/min per [place].   Bilateral pleural effusions, sp bilateral chest tubes placement.  Pleural fluid consistent with exudate per Light's criteria, high LDH and high protein ratio. 03/23 Left chest tube has been removed  Right chest tube reported 420 cc drain.

## 2023-07-05 NOTE — Progress Notes (Addendum)
 Patient ID: Billy [name], male   DOB: 1950-11-09, 73 y.o.   MRN: [id number]     Advanced Heart Failure Rounding Note  Cardiologist: Nanetta Batty, MD   Chief Complaint: RV failure  Subjective:   EGD (3/15) showed bleeding friable duodenal mucosa treated with hemostatic spray. No overt GI bleeding today, hgb 8.9.   In and out of atrial fibrillation, currently in NSR on amiodarone 60 mg/hr.  On heparin gtt.   Co-ox 79, no pressors/inotropes.  BP elevated. CVP 3 this morning.  Afebrile, WBCs 17.7 > 16.8> 14.8>16.4>17.9.  Completed ceftriaxone/doxycycline.   Feels tired this morning. Did not get good rest yesterday and just finished getting a bath.   Objective:   V/Q 3/17 with multiple perfusion defects consistent with CXR findings.  Weight Range: 85.3 kg Body mass index is 24.81 kg/m.   Vital Signs: Temp:  [93.2 F (34 C)-99.7 F (37.6 C)] 98.8 F (37.1 C) (03/19 0700) Pulse Rate:  [69-136] 77 (03/19 0700) Resp:  [15-30] 23 (03/19 0700) BP: (96-176)/(42-108) 162/90 (03/19 0700) SpO2:  [76 %-99 %] 96 % (03/19 0700) Weight:  [85.3 kg] 85.3 kg (03/19 0500) Last BM Date : 07/04/23  Weight change: Filed Weights   07/03/23 0348 07/04/23 0500 07/05/23 0500  Weight: 83.1 kg 83.2 kg 85.3 kg   Intake/Output:  Intake/Output Summary (Last 24 hours) at 07/05/2023 0837 Last data filed at 07/05/2023 0700 Gross per 24 hour  Intake 1854.65 ml  Output 1395 ml  Net 459.65 ml    Physical Exam   General:  elderly/tired appearing.  No respiratory difficulty HEENT: normal Neck: supple. JVD ~5 cm.  Cor: PMI nondisplaced. Regular rate & rhythm. No rubs, gallops or murmurs. Lungs: clear Abdomen: soft, nontender, nondistended. Good bowel sounds. Extremities: no cyanosis, clubbing, rash, +2 BLE edema. +2 RUE edema. +BUE ace wraps and BLE UNNA GU: +foley Neuro: alert & oriented x 3. Moves all 4 extremities w/o difficulty. Affect pleasant.   Telemetry   NSR 70s (personally  reviewed)  EKG    N/A  Labs    CBC Recent Labs    07/04/23 0347 07/05/23 0433  WBC 16.4* 17.9*  HGB 9.8* 8.9*  HCT 32.1* 29.5*  MCV 80.3 80.8  PLT 243 280   Basic Metabolic Panel Recent Labs    40/98/11 2100 07/05/23 0433  NA 144 142  K 4.0 4.1  CL 115* 112*  CO2 22 22  GLUCOSE 147* 123*  BUN 47* 47*  CREATININE 1.76* 1.75*  CALCIUM 8.4* 8.5*  MG 2.2 2.2   Liver Function Tests Recent Labs    07/04/23 0347 07/05/23 0433  AST 32 24  ALT 182* 115*  ALKPHOS 104 80  BILITOT 0.9 0.8  PROT 4.7* 4.6*  ALBUMIN 1.8* 1.8*    No results for input(s): "LIPASE", "AMYLASE" in the last 72 hours. Cardiac Enzymes No results for input(s): "CKTOTAL", "CKMB", "CKMBINDEX", "TROPONINI" in the last 72 hours.  BNP: BNP (last 3 results) Recent Labs    03/09/23 1544 03/11/23 0218 06/27/23 1116  BNP 660.9* 491.3* 646.4*    Imaging   US RENAL Result Date: 07/04/2023 CLINICAL DATA:  Acute kidney injury EXAM: RENAL / URINARY TRACT ULTRASOUND COMPLETE COMPARISON:  04/13/2018 FINDINGS: Right Kidney: Renal measurements: 12.0 x 5.4 x 6.0 cm = volume: 206 mL. Echogenicity within normal limits. No mass or hydronephrosis visualized. Left Kidney: Renal measurements: Not visualized. Bladder: Foley catheter in place, decompressed. Other: None. IMPRESSION: No acute findings within the right kidney.  No hydronephrosis.  Left kidney not visualized. Electronically Signed   By: Charlett Nose M.D.   On: 07/04/2023 20:22   VAS [place] UPPER EXTREMITY VENOUS DUPLEX Result Date: 07/04/2023 UPPER VENOUS STUDY  Patient Name:  Billy [name]  Date of Exam:   07/04/2023 Medical Rec #: [id number]   Accession #:    [id number] Date of Birth: January 10, 1951   Patient Gender: M Patient Age:   4 years Exam Location:  [facility] Procedure:      VAS [place] UPPER EXTREMITY VENOUS DUPLEX Referring Phys: ALMA DIAZ --------------------------------------------------------------------------------  Indications: Rule out DVT,  Other Indications: H/O DVT in the lower extremities. Comparison Study: No prior exam. Performing Technologist: Fernande Bras  Examination Guidelines: A complete evaluation includes B-mode imaging, spectral Doppler, color Doppler, and power Doppler as needed of all accessible portions of each vessel. Bilateral testing is considered an integral part of a complete examination. Limited examinations for reoccurring indications may be performed as noted.  Right Findings: +----------+------------+---------+-----------+----------+-----------------+ RIGHT     CompressiblePhasicitySpontaneousProperties     Summary      +----------+------------+---------+-----------+----------+-----------------+ IJV           Full       Yes       Yes                                +----------+------------+---------+-----------+----------+-----------------+ Subclavian    Full       Yes       Yes                                +----------+------------+---------+-----------+----------+-----------------+ Axillary      Full       Yes       Yes                                +----------+------------+---------+-----------+----------+-----------------+ Brachial    Partial      No        No                                 +----------+------------+---------+-----------+----------+-----------------+ Radial                                               Not visualized   +----------+------------+---------+-----------+----------+-----------------+ Ulnar         Full       Yes       Yes                                +----------+------------+---------+-----------+----------+-----------------+ Cephalic      None       No        No                     Acute       +----------+------------+---------+-----------+----------+-----------------+ Basilic       None       No        No               Age Indeterminate +----------+------------+---------+-----------+----------+-----------------+ Thrombus  noted in only one the distal left brachial veins.  Left Findings: +----------+------------+---------+-----------+----------+-----------------+ LEFT      CompressiblePhasicitySpontaneousProperties     Summary      +----------+------------+---------+-----------+----------+-----------------+ IJV           Full       Yes       Yes                                +----------+------------+---------+-----------+----------+-----------------+ Subclavian                                           Not visualized   +----------+------------+---------+-----------+----------+-----------------+ Axillary      Full       Yes       Yes                                +----------+------------+---------+-----------+----------+-----------------+ Brachial      Full       Yes       Yes                                +----------+------------+---------+-----------+----------+-----------------+ Radial        Full                                                    +----------+------------+---------+-----------+----------+-----------------+ Ulnar         Full                                                    +----------+------------+---------+-----------+----------+-----------------+ Cephalic      None       No        No               Age Indeterminate +----------+------------+---------+-----------+----------+-----------------+ Basilic       None       No        No                     Acute       +----------+------------+---------+-----------+----------+-----------------+  Summary:  Right: Findings consistent with acute deep vein thrombosis involving the right brachial veins. Findings consistent with acute superficial vein thrombosis involving the right cephalic vein. Findings consistent with age indeterminate superficial vein thrombosis involving the right basilic vein.  Left: No evidence of deep vein thrombosis in the upper extremity. Findings consistent with acute superficial vein  thrombosis involving the left basilic vein. Findings consistent with age indeterminate superficial vein thrombosis involving the left cephalic vein.  *See table(s) above for measurements and observations.  Diagnosing physician: Lemar Livings MD Electronically signed by Lemar Livings MD on 07/04/2023 at 3:44:47 PM.    Final    Medications:   Scheduled Medications:  atorvastatin  80 mg Oral Daily   Chlorhexidine Gluconate Cloth  6 each Topical Daily   ezetimibe  10 mg Oral Daily   feeding  supplement  237 mL Oral TID BM   hydrALAZINE  100 mg Oral Q8H   insulin aspart  0-9 Units Subcutaneous Q4H   pantoprazole (PROTONIX) IV  40 mg Intravenous Q12H   sertraline  50 mg Oral QHS   sodium chloride flush  10-40 mL Intracatheter Q12H   spironolactone  12.5 mg Oral Daily   Infusions:  amiodarone 60 mg/hr (07/05/23 0700)   heparin 1,800 Units/hr (07/05/23 0700)    PRN Medications: docusate sodium, hydrALAZINE, HYDROmorphone (DILAUDID) injection, influenza vaccine adjuvanted, iohexol, iohexol, mouth rinse, pneumococcal 20-valent conjugate vaccine, polyethylene glycol, sodium chloride flush  Patient Profile   Billy [name] is a 73 yo male with PMH of HTN, HLD, prior DVT, CVA, paroxysmal atrial fibrillation, CKD stage IIIb, carotid artery disease s/p endarterectomy, and chronic diastolic CHF. Admitted following out of hospital witnessed cardiac arrest.   Assessment/Plan  1. Cardiac arrest: Witnessed arrest with bystander then EMS CPR.  >20 minutes CPR.  He was shocked for VF.  Had additional 4-5 minutes PEA arrest in ER. ?PE with aspiration given bilateral DVTs.  - He is on amiodarone.  2. Cardiogenic shock: Initial echo: EF 45%, diffuse HK, severe LVH, normal RV size with mod decreased systolic function, small pericardial effusion, dilated IVC.  Suspicion for PE as above.  PNA on CXR, initial hypotension thought to be due to septic shock.  He is now off pressors.   3. Acute HF with mid range EF: With  prominent RV failure. Initial echo: EF 45%, diffuse HK, severe LVH, normal RV size with mod decreased systolic function, small pericardial effusion, dilated IVC. As above, worry for PE with RV failure. Baseline cardiomyopathy with severe LVH raises concern for cardiac amyloidosis.  TEE 03/13: EF 55%, severe LVH with mild RV dysfunction, small IVC.  - CVP 3. Co-ox 79%. SCr  - Continue TED hose. - Continue hydralazine 100 tid for HTN.  - Increase spiro 12.5>25 mg daily - He should have workup for cardiac amyloidosis, cardiac MRI before discharge.  - L/RHC later today.   4. Venous thromboembolism: Bilateral DVTs, mixed acute and chronic.  He is not anticoagulated at home.  With RV failure on echo, strong suspicion for PE.  - Unfortunately off AC with GI bleeding.  Now s/p infrarenal IVC placement 3/16.   - V/Q 3/17 with multiple perfusion defects consistent with CXR findings. - No CTA with AKI.  - BUE [place] with SVTs and DVT.  - Continue heparin gtt  5. CAD: NSTEMI with HS-TnI 3045.  ?Demand ischemia from arrest vs ACS.  He has history of carotid disease. - Plavix stopped with GI bleeding.  - Atorvastatin 80 daily.  - Avoid coronary angiography with no STEMI and AKI.   6. Carotid stenosis: h/o CVA.  TCAR in 6/24.  OK to stop Plavix at this point (now off).   7. Atrial fibrillation: He was not on AC at home though has history of prior CVA.  AC stopped due to prior GI bleeds. He went into AF/RVR here, tolerated poorly.  TEE/DCCV on 03/13.  Back in AF with intermittent RVR - May need Watchman more long-term. Back on heparin. Now with IVC filter.  No further overt GI bleeding.  - Decrease amiodarone gtt 60>30 mg/hr  - Daily BMET  8. AKI on CKD 3b: Suspect ATN in setting of hypotension/arrest.  Baseline Scr seems to be around 2.1. Creatinine 2.55 > 2.28 > 2.05>1.53>1.77>1.75 today.  9. ID: PCT 8.37, suspect PNA.   - He has  completed course of ceftriaxone/doxycycline.   10. Neuro: Somewhat slowed  post arrest.  11. GI bleeding: Hematochezia with AC.  Had this in the past with [facility] as well.  GI following.  Heparin gtt stopped, has had transfusion here. EGD showed bleeding friable duodenal mucosa treated with hemostatic spray.  Ideally, would restart at least short-term [facility] and then plan Watchman in the future.  - Continue PPI IV - Re-challenging with heparin gtt.  Hgb stable.    CRITICAL CARE Performed by: Alen Bleacher  Total critical care time: 14 minutes  Critical care time was exclusive of separately billable procedures and treating other patients.  Critical care was necessary to treat or prevent imminent or life-threatening deterioration.  Critical care was time spent personally by me on the following activities: development of treatment plan with patient and/or surrogate as well as nursing, discussions with consultants, evaluation of patient's response to treatment, examination of patient, obtaining history from patient or surrogate, ordering and performing treatments and interventions, ordering and review of laboratory studies, ordering and review of radiographic studies, pulse oximetry and re-evaluation of patient's condition.   Alen Bleacher AGACNP-BC 07/05/2023 8:37 AM  Agree with above.   Remains very tenuous. In/out AFL with RVR. Denies CP. + SOB   CVP 3 Co-ox 79%   On I've heparin for diffuse DVT with ? PE  Recurrent mild BRBPR today  General:  Weak appearing. Mildly SOB HEENT: normal Neck: supple. JVP flat  Carotids 2+ bilat; no bruits. No lymphadenopathy or thryomegaly appreciated. Cor: Irreg tachy  Lungs: clear Abdomen: soft, nontender, nondistended. No hepatosplenomegaly. No bruits or masses. Good bowel sounds. Extremities: no cyanosis, clubbing, rash, 2-3+ edema Neuro: alert & orientedx3, cranial nerves grossly intact. moves all 4 extremities w/o difficulty. Affect pleasant   Remains very tenuous. Will plan coronary angio today with limited contrast to assess  for critical CAD as well as RHC  Will need cMRI tomorrow to exclude infiltrative process.   AC on hold with GI bleed. Has IVC filter but will ned to restart [facility] soon.   CVP low but markedly overloaded. Will need diuresis.   CRITICAL CARE Performed by: Arvilla Meres  Total critical care time: 55 minutes  Critical care time was exclusive of separately billable procedures and treating other patients.  Critical care was necessary to treat or prevent imminent or life-threatening deterioration.  Critical care was time spent personally by me (independent of midlevel providers or residents) on the following activities: development of treatment plan with patient and/or surrogate as well as nursing, discussions with consultants, evaluation of patient's response to treatment, examination of patient, obtaining history from patient or surrogate, ordering and performing treatments and interventions, ordering and review of laboratory studies, ordering and review of radiographic studies, pulse oximetry and re-evaluation of patient's condition.  Arvilla Meres, MD  4:00 PM

## 2023-07-05 NOTE — H&P (View-Only) (Signed)
 Saw patient during afternoon rounds. RN with concerns of new melena.   Stat H&H with stable Hgb, better than this am. Holding heparin gtt pre-cath. May need to reach out to GI if there is a reoccurrence. Failed heparin challenge. Will follow.   Alen Bleacher AGACNP-BC  Advanced Heart Failure Team

## 2023-07-05 NOTE — Progress Notes (Signed)
 Saw patient during afternoon rounds. RN with concerns of new melena.   Stat H&H with stable Hgb, better than this am. Holding heparin gtt pre-cath. May need to reach out to GI if there is a reoccurrence. Failed heparin challenge. Will follow.   Alen Bleacher AGACNP-BC  Advanced Heart Failure Team

## 2023-07-05 NOTE — Assessment & Plan Note (Addendum)
 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 Acute deep vein thrombosis involving the left proximal profunda vein and left proximal femoral vein.  Indeterminate deep vein thrombosis involving the left popliteal vein, and left middle and distal femoral vein.   Suspected acute pulmonary embolism. (High pre test probability)  03/17 V/Q scan with multiple perfusion defects which are matches in size to the corresponding x ray findings, compatible with non diagnostic exam (low to intermediate probability).   SP IV filter.  Anticoagulation with IV heparin.

## 2023-07-05 NOTE — Assessment & Plan Note (Addendum)
 Sp direct current cardioversion, patient in and out atrial fibrillation.  This morning he was on sinus rhythm.   Plan to continue with amiodarone and anticoagulation with iV heparin.

## 2023-07-06 ENCOUNTER — Inpatient Hospital Stay (HOSPITAL_COMMUNITY)

## 2023-07-06 ENCOUNTER — Encounter (HOSPITAL_COMMUNITY): Payer: Self-pay | Admitting: Internal Medicine

## 2023-07-06 DIAGNOSIS — I5033 Acute on chronic diastolic (congestive) heart failure: Secondary | ICD-10-CM | POA: Diagnosis not present

## 2023-07-06 DIAGNOSIS — K921 Melena: Secondary | ICD-10-CM

## 2023-07-06 DIAGNOSIS — I469 Cardiac arrest, cause unspecified: Secondary | ICD-10-CM | POA: Diagnosis not present

## 2023-07-06 DIAGNOSIS — N1832 Chronic kidney disease, stage 3b: Secondary | ICD-10-CM | POA: Diagnosis not present

## 2023-07-06 DIAGNOSIS — J90 Pleural effusion, not elsewhere classified: Secondary | ICD-10-CM

## 2023-07-06 DIAGNOSIS — I480 Paroxysmal atrial fibrillation: Secondary | ICD-10-CM | POA: Diagnosis not present

## 2023-07-06 LAB — COMPREHENSIVE METABOLIC PANEL
ALT: 83 U/L — ABNORMAL HIGH (ref 0–44)
AST: 24 U/L (ref 15–41)
Albumin: 1.8 g/dL — ABNORMAL LOW (ref 3.5–5.0)
Alkaline Phosphatase: 79 U/L (ref 38–126)
Anion gap: 6 (ref 5–15)
BUN: 47 mg/dL — ABNORMAL HIGH (ref 8–23)
CO2: 22 mmol/L (ref 22–32)
Calcium: 8.4 mg/dL — ABNORMAL LOW (ref 8.9–10.3)
Chloride: 113 mmol/L — ABNORMAL HIGH (ref 98–111)
Creatinine, Ser: 1.71 mg/dL — ABNORMAL HIGH (ref 0.61–1.24)
GFR, Estimated: 43 mL/min — ABNORMAL LOW (ref >60)
Glucose, Bld: 112 mg/dL — ABNORMAL HIGH (ref 70–99)
Potassium: 4.2 mmol/L (ref 3.5–5.1)
Sodium: 141 mmol/L (ref 135–145)
Total Bilirubin: 0.7 mg/dL (ref 0.0–1.2)
Total Protein: 4.7 g/dL — ABNORMAL LOW (ref 6.5–8.1)

## 2023-07-06 LAB — LACTATE DEHYDROGENASE, PLEURAL OR PERITONEAL FLUID: LD, Fluid: 115 U/L — ABNORMAL HIGH (ref 3–23)

## 2023-07-06 LAB — CBC
HCT: 27 % — ABNORMAL LOW (ref 39.0–52.0)
Hemoglobin: 7.9 g/dL — ABNORMAL LOW (ref 13.0–17.0)
MCH: 23.7 pg — ABNORMAL LOW (ref 26.0–34.0)
MCHC: 29.3 g/dL — ABNORMAL LOW (ref 30.0–36.0)
MCV: 81.1 fL (ref 80.0–100.0)
Platelets: 325 10*3/uL (ref 150–400)
RBC: 3.33 MIL/uL — ABNORMAL LOW (ref 4.22–5.81)
RDW: 19.3 % — ABNORMAL HIGH (ref 11.5–15.5)
WBC: 16 10*3/uL — ABNORMAL HIGH (ref 4.0–10.5)
nRBC: 0.2 % (ref 0.0–0.2)

## 2023-07-06 LAB — GLUCOSE, PLEURAL OR PERITONEAL FLUID: Glucose, Fluid: 108 mg/dL

## 2023-07-06 LAB — HEMOGLOBIN AND HEMATOCRIT, BLOOD
HCT: 28.2 % — ABNORMAL LOW (ref 39.0–52.0)
HCT: 27.6 % — ABNORMAL LOW (ref 39.0–52.0)
Hemoglobin: 8.5 g/dL — ABNORMAL LOW (ref 13.0–17.0)
Hemoglobin: 8.3 g/dL — ABNORMAL LOW (ref 13.0–17.0)

## 2023-07-06 LAB — PROTEIN, PLEURAL OR PERITONEAL FLUID: Total protein, fluid: <3 g/dL

## 2023-07-06 LAB — GLUCOSE, CAPILLARY
Glucose-Capillary: 106 mg/dL — ABNORMAL HIGH (ref 70–99)
Glucose-Capillary: 107 mg/dL — ABNORMAL HIGH (ref 70–99)
Glucose-Capillary: 99 mg/dL (ref 70–99)
Glucose-Capillary: 92 mg/dL (ref 70–99)
Glucose-Capillary: 140 mg/dL — ABNORMAL HIGH (ref 70–99)

## 2023-07-06 LAB — COOXEMETRY PANEL
Carboxyhemoglobin: 2 % — ABNORMAL HIGH (ref 0.5–1.5)
Methemoglobin: 0.8 % (ref 0.0–1.5)
O2 Saturation: 85 %
Total hemoglobin: 8.5 g/dL — ABNORMAL LOW (ref 12.0–16.0)

## 2023-07-06 LAB — BODY FLUID CELL COUNT WITH DIFFERENTIAL
Eos, Fluid: 0 %
Lymphs, Fluid: 95 %
Monocyte-Macrophage-Serous Fluid: 3 % — ABNORMAL LOW (ref 50–90)
Neutrophil Count, Fluid: 2 % (ref 0–25)
Total Nucleated Cell Count, Fluid: 223 uL (ref 0–1000)

## 2023-07-06 LAB — HEPARIN LEVEL (UNFRACTIONATED): Heparin Unfractionated: <0.1 [IU]/mL — ABNORMAL LOW (ref 0.30–0.70)

## 2023-07-06 MED ORDER — HYDROMORPHONE HCL 1 MG/ML IJ SOLN
1.0000 mg | Freq: Once | INTRAMUSCULAR | Status: AC | PRN
  Administered 2023-07-06: 1 mg via INTRAVENOUS
  Filled 2023-07-06: qty 1

## 2023-07-06 MED ORDER — TORSEMIDE 20 MG PO TABS
40.0000 mg | ORAL_TABLET | Freq: Every day | ORAL | Status: DC
  Administered 2023-07-06 – 2023-07-08 (×3): 40 mg via ORAL
  Filled 2023-07-06 (×3): qty 2

## 2023-07-06 MED ORDER — PANTOPRAZOLE SODIUM 40 MG IV SOLR
40.0000 mg | Freq: Once | INTRAVENOUS | Status: AC
  Administered 2023-07-06: 40 mg via INTRAVENOUS
  Filled 2023-07-06: qty 10

## 2023-07-06 MED ORDER — PANTOPRAZOLE SODIUM 40 MG IV SOLR: 40.0000 mg | Freq: Two times a day (BID) | INTRAVENOUS | Status: DC

## 2023-07-06 MED ORDER — PANTOPRAZOLE SODIUM 40 MG IV SOLR
40.0000 mg | INTRAVENOUS | Status: DC
  Filled 2023-07-06: qty 10

## 2023-07-06 MED ORDER — PANTOPRAZOLE SODIUM 40 MG IV SOLR
40.0000 mg | Freq: Four times a day (QID) | INTRAVENOUS | Status: DC
  Administered 2023-07-06 – 2023-07-07 (×4): 40 mg via INTRAVENOUS
  Filled 2023-07-06 (×5): qty 10

## 2023-07-06 MED ORDER — IOHEXOL 350 MG/ML SOLN
75.0000 mL | Freq: Once | INTRAVENOUS | Status: AC | PRN
  Administered 2023-07-06: 75 mL via INTRAVENOUS

## 2023-07-06 MED ORDER — ENSURE ENLIVE PO LIQD: 237.0000 mL | Freq: Three times a day (TID) | ORAL | Status: DC | PRN

## 2023-07-06 MED ORDER — LIDOCAINE HCL (PF) 1 % IJ SOLN: 30.0000 mL | Freq: Once | INTRAMUSCULAR | Status: AC

## 2023-07-06 MED ORDER — LIDOCAINE HCL (PF) 1 % IJ SOLN
INTRAMUSCULAR | Status: AC
Start: 2023-07-06 — End: 2023-07-06
  Administered 2023-07-06: 10 mL
  Filled 2023-07-06: qty 30

## 2023-07-06 MED ORDER — OXYCODONE HCL 5 MG PO TABS
5.0000 mg | ORAL_TABLET | Freq: Four times a day (QID) | ORAL | Status: AC
  Administered 2023-07-06 – 2023-07-07 (×3): 5 mg via ORAL
  Filled 2023-07-06 (×3): qty 1

## 2023-07-06 MED ORDER — ENSURE ENLIVE PO LIQD
237.0000 mL | Freq: Three times a day (TID) | ORAL | Status: DC
  Administered 2023-07-06 – 2023-07-13 (×22): 237 mL via ORAL

## 2023-07-06 MED ORDER — PANTOPRAZOLE SODIUM 40 MG IV SOLR: 40.0000 mg | Freq: Four times a day (QID) | INTRAVENOUS | Status: DC

## 2023-07-06 NOTE — Consult Note (Signed)
   NAME:  Billy [name], MRN:  [id number], DOB:  Aug 16, 1950, LOS: 10 ADMISSION DATE:  Jun 27, 2023, CONSULTATION DATE:  2023-06-27 REFERRING MD:  ED Physician, CHIEF COMPLAINT:  Cardiac Arrest   History of Present Illness:  73 y/o male with h/o CHF, CKD stage IV, Atrial Fibrillation, BPH, Anemia, carotid artery disease s/p endarterectomy and stenting, chornic rhinitis, HLD and RLS who went to his neighbors house today after having SOB for at least 1 week and had a cardiac arrest.  Neighbor stated CPR immediately and EMS continued .  He got ROSC back after about 15 min.  Then in the ED he arrested again for about 2-5 min and then achieved ROSC.  In the ED he was given 1 Epi and had chest compressions.   In the ED he was seen by Cardiology for possible STEMI, but felt as though he was not a a STEMI.  Pertinent  Medical History  Chronic kidney disease, stage 4 (severe)  Atherosclerosis of renal artery Unspecified atrial fibrillation  Personal history of other venous thrombosis and embolism Chronic systolic (congestive) heart failure  Significant Hospital Events: Including procedures, antibiotic start and stop dates in addition to other pertinent events   June 27, 2023: Post code in field, coded x 3 min in ED, CPR and 1 epinephrine given to achieve ROSC, intubated 3/11: following commands  3/12: extubated on [facility] 3/13:  Con't on 5LNC. He says he feels sob with laying flat.  Underwent TEE which was negative for LAA thrombus therefore patient was cardioverted to NSR complications 3/14 No acute issues overnight, remains in NSR; developed BRBPR; heparin stopped 3/15 EGD: duodenitis, ongoing BRBPR but H/H stable 3/16 for IVC filter 3/20 Bilat effusions seen on CT abd/pelvis, PCCM consult for possible chest tubes  Interim History / Subjective:  He has been having some mild cough, no fevers or chills, requiring [place] oxygen  Objective   Blood pressure (!) 153/62, pulse 79, temperature 98.2 F (36.8 C), temperature  source Axillary, resp. rate 20, height 6\' 1"  (1.854 m), weight 83.3 kg, SpO2 97%. CVP:  [4 mmHg-51 mmHg] 6 mmHg      Intake/Output Summary (Last 24 hours) at 07/06/2023 1637 Last data filed at 07/06/2023 1600 Gross per 24 hour  Intake 972.26 ml  Output 2180 ml  Net -1207.74 ml   Filed Weights   07/05/23 0500 07/05/23 0900 07/06/23 0500  Weight: 85.3 kg 84.4 kg 83.3 kg    Examination: No distress Anxious, slow to answer questions (similar to yesterday) +chest wall pain to palpation Globally weak Abd soft No further BRBPR  Resolved Hospital Problem list   AGMA, resolved, resolved  Lactic acidosis, resolved   Assessment & Plan:    Bilateral pleural effusions Seen on CT and appear complicated on ultrasound -plan for bilateral pigtail chest tubes and will send fluid studies -CT to suction, flush and tube care per protocol  -continue supplemental O2 prn to maintain sats >92% -thank you for this consult, we will continue to follow along with you   Rest of plan per primary team and HF team   Darcella Gasman Antoinett Dorman, PA-C [facility] See Amion for pager If no response to pager , please call [phone] until 7pm After 7:00 pm call Elink  [phone]

## 2023-07-06 NOTE — Procedures (Signed)
 Insertion of Chest Tube Procedure Note  Billy [name]  [id number]  [birth year]  Date:07/06/23  Time:6:34 PM    Provider Performing: Darcella Gasman Samuel Rittenhouse   Procedure: Chest Tube Insertion (32551)  Indication(s) Effusion  Consent Risks of the procedure as well as the alternatives and risks of each were explained to the patient and/or caregiver.  Consent for the procedure was obtained and is signed in the bedside chart  Anesthesia Topical only with 1% lidocaine    Time Out Verified patient identification, verified procedure, site/side was marked, verified correct patient position, special equipment/implants available, medications/allergies/relevant history reviewed, required imaging and test results available.   Sterile Technique Maximal sterile technique including full sterile barrier drape, hand hygiene, sterile gown, sterile gloves, mask, hair covering, sterile ultrasound probe cover (if used).   Procedure Description Ultrasound used to identify appropriate pleural anatomy for placement and overlying skin marked. Area of placement cleaned and draped in sterile fashion.  A 14 French pigtail pleural catheter was placed into the left pleural space using Seldinger technique. Appropriate return of fluid was obtained.  The tube was connected to atrium and placed on -20 cm H2O wall suction.   Complications/Tolerance None; patient tolerated the procedure well. Chest X-ray is ordered to verify placement.   EBL Minimal  Specimen(s) Straw colored fluid   Darcella Gasman Senta Kantor, PA-C

## 2023-07-06 NOTE — Procedures (Signed)
 Insertion of Chest Tube Procedure Note  Billy [name]  [id number]  1950-08-18  Date:07/06/23  Time:6:42 PM    Provider Performing: Cheri Fowler   Procedure: Pleural Catheter Insertion w/ Imaging Guidance (47829)  Indication(s) Effusion  Consent Risks of the procedure as well as the alternatives and risks of each were explained to the patient and/or caregiver.  Consent for the procedure was obtained and is signed in the bedside chart  Anesthesia Topical only with 1% lidocaine    Time Out Verified patient identification, verified procedure, site/side was marked, verified correct patient position, special equipment/implants available, medications/allergies/relevant history reviewed, required imaging and test results available.   Sterile Technique Maximal sterile technique including full sterile barrier drape, hand hygiene, sterile gown, sterile gloves, mask, hair covering, sterile ultrasound probe cover (if used).   Procedure Description Ultrasound used to identify appropriate pleural anatomy for placement and overlying skin marked. Area of placement cleaned and draped in sterile fashion.  A 14 French pigtail pleural catheter was placed into the right pleural space using Seldinger technique. Appropriate return of fluid was obtained.  The tube was connected to atrium and placed on -20 cm H2O wall suction.   Complications/Tolerance None; patient tolerated the procedure well. Chest X-ray is ordered to verify placement.   EBL Minimal  Specimen(s) fluid

## 2023-07-06 NOTE — Progress Notes (Addendum)
 Patient ID: Billy [name], male   DOB: 03/10/1951, 73 y.o.   MRN: [id number]     Advanced Heart Failure Rounding Note  Cardiologist: Nanetta Batty, MD   Chief Complaint: RV failure  Subjective:   EGD (3/15) showed bleeding friable duodenal mucosa treated with hemostatic spray. No overt GI bleeding today, hgb 8.9.   In and out of atrial fibrillation, currently in NSR on amiodarone 30 mg/hr.   Yesterday afternoon with BRBPR x1. Heparin gtt held.   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 Mild PAH with severe RV failure though cardiac output is preserved. Hep gtt restarted post-cath.   Co-ox 85, no pressors/inotropes.  BP elevated. CVP unhooked this morning.  Afebrile, WBCs 17.7 > 16.8> 14.8>16.4>17.9>16.  Completed ceftriaxone/doxycycline.   Sitting up in chair. Walked around the unit this morning. Feels ok. Had another episode of BRBPR this am. Holding heparin again and reaching out to GI.   Objective:   V/Q 3/17 with multiple perfusion defects consistent with CXR findings.  Weight Range: 83.3 kg Body mass index is 24.23 kg/m.   Vital Signs: Temp:  [96.9 F (36.1 C)-98.9 F (37.2 C)] 97.9 F (36.6 C) (03/20 0755) Pulse Rate:  [70-127] 79 (03/20 0800) Resp:  [13-31] 19 (03/20 0800) BP: (105-197)/(44-104) 132/44 (03/20 0800) SpO2:  [80 %-99 %] 96 % (03/20 0800) Weight:  [83.3 kg] 83.3 kg (03/20 0500) Last BM Date : 07/05/23  Weight change: Filed Weights   07/05/23 0500 07/05/23 0900 07/06/23 0500  Weight: 85.3 kg 84.4 kg 83.3 kg   Intake/Output:  Intake/Output Summary (Last 24 hours) at 07/06/2023 0948 Last data filed at 07/06/2023 0700 Gross per 24 hour  Intake 799.69 ml  Output 1200 ml  Net -400.31 ml    Physical Exam  General:  elderly appearing.  No respiratory difficulty HEENT: normal Neck: supple. JVD ~7 cm.  Cor: PMI nondisplaced. Regular rate & rhythm. No rubs, gallops or murmurs. Lungs:  clear Abdomen: soft, nontender, nondistended. Good bowel sounds. Extremities: no cyanosis, clubbing, rash, +1-2 BLE edema. +1 BUE edema.  GU; +foley Neuro: alert & oriented x 3. Moves all 4 extremities w/o difficulty. Affect pleasant.   Telemetry   NSR 70s (personally reviewed)  EKG    N/A  Labs    CBC Recent Labs    07/05/23 0433 07/05/23 1359 07/05/23 1646 07/06/23 0510  WBC 17.9*  --   --  16.0*  HGB 8.9*   < > 9.9* 7.9*  HCT 29.5*   < > 29.0* 27.0*  MCV 80.8  --   --  81.1  PLT 280  --   --  325   < > = values in this interval not displayed.   Basic Metabolic Panel Recent Labs    21/30/86 2100 07/05/23 0433 07/05/23 1625 07/05/23 1646 07/06/23 0510  NA 144 142   < > 144 141  K 4.0 4.1   < > 4.1 4.2  CL 115* 112*  --   --  113*  CO2 22 22  --   --  22  GLUCOSE 147* 123*  --   --  112*  BUN 47* 47*  --   --  47*  CREATININE 1.76* 1.75*  --   --  1.71*  CALCIUM 8.4* 8.5*  --   --  8.4*  MG 2.2 2.2  --   --   --    < > = values in this  interval not displayed.   Liver Function Tests Recent Labs    07/05/23 0433 07/06/23 0510  AST 24 24  ALT 115* 83*  ALKPHOS 80 79  BILITOT 0.8 0.7  PROT 4.6* 4.7*  ALBUMIN 1.8* 1.8*    No results for input(s): "LIPASE", "AMYLASE" in the last 72 hours. Cardiac Enzymes No results for input(s): "CKTOTAL", "CKMB", "CKMBINDEX", "TROPONINI" in the last 72 hours.  BNP: BNP (last 3 results) Recent Labs    03/09/23 1544 03/11/23 0218 06/27/23 1116  BNP 660.9* 491.3* 646.4*    Imaging   CARDIAC CATHETERIZATION Result Date: 07/05/2023   1st Mrg lesion is 70% stenosed.   Prox LAD to Mid LAD lesion is 50% stenosed.   Dist LAD lesion is 60% stenosed.   Prox RCA lesion is 100% stenosed. Findings: Ao = 125/65 (91) LV = 140/10 RA = 9 RV = 24/16 PA = 40/31 (30) PCW = 14 Fick cardiac output/index = 7.4/3.5 Thermo CO/CI = 7.3/3.5 PVR = 2.2 WU Ao sat = 96% PA sat = 65% PAPi = 1.0 Assessment: 1. 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 Mild PAH with severe RV failure though cardiac output is preserved Plan/Discussion: Medical therapy Arvilla Meres, MD 5:06 PM  Medications:   Scheduled Medications:  atorvastatin  80 mg Oral Daily   Chlorhexidine Gluconate Cloth  6 each Topical Daily   ezetimibe  10 mg Oral Daily   feeding supplement  237 mL Oral TID BM   hydrALAZINE  100 mg Oral Q8H   insulin aspart  0-9 Units Subcutaneous Q4H   lidocaine  1 patch Transdermal Q24H   pantoprazole (PROTONIX) IV  40 mg Intravenous Q12H   sertraline  50 mg Oral QHS   sodium chloride flush  10-40 mL Intracatheter Q12H   sodium chloride flush  3 mL Intravenous Q12H   spironolactone  25 mg Oral Daily   Infusions:  sodium chloride     amiodarone 30 mg/hr (07/06/23 0700)   heparin 800 Units/hr (07/06/23 0700)    PRN Medications: sodium chloride, acetaminophen, docusate sodium, HYDROmorphone (DILAUDID) injection, influenza vaccine adjuvanted, ondansetron (ZOFRAN) IV, mouth rinse, pneumococcal 20-valent conjugate vaccine, polyethylene glycol, sodium chloride flush, sodium chloride flush  Patient Profile   Billy [name] is a 73 yo male with PMH of HTN, HLD, prior DVT, CVA, paroxysmal atrial fibrillation, CKD stage IIIb, carotid artery disease s/p endarterectomy, and chronic diastolic CHF. Admitted following out of hospital witnessed cardiac arrest.   Assessment/Plan  1. Cardiac arrest: Witnessed arrest with bystander then EMS CPR.  >20 minutes CPR.  He was shocked for VF.  Had additional 4-5 minutes PEA arrest in ER. ?PE with aspiration given bilateral DVTs.  - He is on amiodarone.  2. Cardiogenic shock: Initial echo: EF 45%, diffuse HK, severe LVH, normal RV size with mod decreased systolic function, small pericardial effusion, dilated IVC.  Suspicion for PE as above.  PNA on CXR, initial hypotension thought to be due to septic shock.  He is now off pressors.   3. Acute HF with  mid range EF: With prominent RV failure. Initial echo: EF 45%, diffuse HK, severe LVH, normal RV size with mod decreased systolic function, small pericardial effusion, dilated IVC. As above, worry for PE with RV failure. Baseline cardiomyopathy with severe LVH raises concern for cardiac amyloidosis.  TEE 03/13: EF 55%, severe LVH with mild RV dysfunction, small IVC.  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 Mild PAH with severe RV failure though cardiac output is preserved. Hep gtt restarted post-cath.  - CVP unhooked. Co-ox 85%. SCr stable at 1.7 - Continue TED hose. - Continue hydralazine 100 tid for HTN.  - Continue spiro 25 mg daily - He should have workup for cardiac amyloidosis, cardiac MRI before discharge.   4. Venous thromboembolism: Bilateral DVTs, mixed acute and chronic.  He is not anticoagulated at home.  With RV failure on echo, strong suspicion for PE.  - Unfortunately off AC with GI bleeding.  Now s/p infrarenal IVC placement 3/16.   - V/Q 3/17 with multiple perfusion defects consistent with CXR findings. - No CTA with AKI.  - BUE [place] with SVTs and DVT.  - Holding hep gtt with recurrent BRBPR that started yesterday afternoon.   5. CAD: NSTEMI with HS-TnI 3045.  ?Demand ischemia from arrest vs ACS.  He has history of carotid disease. - Plavix stopped with GI bleeding.  - Atorvastatin 80 daily.  - Avoid coronary angiography with no STEMI and AKI.   6. Carotid stenosis: h/o CVA.  TCAR in 6/24.  OK to stop Plavix at this point (now off).   7. Atrial fibrillation: He was not on AC at home though has history of prior CVA.  AC stopped due to prior GI bleeds. He went into AF/RVR here, tolerated poorly.  TEE/DCCV on 03/13.  Back in AF with intermittent RVR - May need Watchman more long-term. Now with IVC filter. Failed heparin challenge.  - Continue amiodarone gtt 30 mg/hr  - Daily BMET  8. AKI on CKD 3b: Suspect ATN in  setting of hypotension/arrest.  Baseline Scr seems to be around 2.1. Creatinine 2.55 > 2.28 > 2.05>1.53>1.77>1.75>1.71 today.  9. ID: PCT 8.37, suspect PNA.   - He has completed course of ceftriaxone/doxycycline.   10. Neuro: Somewhat slowed post arrest.  11. GI bleeding: Hematochezia with AC.  Had this in the past with [facility] as well.  GI following.  Heparin gtt stopped, has had transfusion here. EGD showed bleeding friable duodenal mucosa treated with hemostatic spray.  Ideally, would restart at least short-term [facility] and then plan Watchman in the future.  - Continue PPI IV - Failed heparin challenge. Will reach back out to GI today.  - Hgb stable. 9.9>7.9 - Stat H&H   CRITICAL CARE Performed by: Alen Bleacher  Total critical care time: 15 minutes  Critical care time was exclusive of separately billable procedures and treating other patients.  Critical care was necessary to treat or prevent imminent or life-threatening deterioration.  Critical care was time spent personally by me on the following activities: development of treatment plan with patient and/or surrogate as well as nursing, discussions with consultants, evaluation of patient's response to treatment, examination of patient, obtaining history from patient or surrogate, ordering and performing treatments and interventions, ordering and review of laboratory studies, ordering and review of radiographic studies, pulse oximetry and re-evaluation of patient's condition.   Alen Bleacher AGACNP-BC 07/06/2023 9:48 AM  Agree with above  Cath yesterday with CTO RCA otherwise non-obstructive CAD. RHC with high output. Mild PAH. Low PAPI   Had recurrent GI bleed today. GI has seen. CT angio ordered   Now off heparin.  Remains weak/SOB  Back in NSR on IV amio   General:  Weak appearing. No resp difficulty HEENT: normal Neck: supple. JVP 8. Carotids 2+ bilat; no bruits. No lymphadenopathy or  thryomegaly appreciated. Cor: PMI  nondisplaced. Regular rate & rhythm. Lungs: decreased at bases  Abdomen: soft, nontender, nondistended. No hepatosplenomegaly. No bruits or masses. Good bowel sounds. Extremities: no cyanosis, clubbing, rash, 2+ UE/LE edema + TED Neuro: alert & orientedx3, cranial nerves grossly intact. moves all 4 extremities w/o difficulty. Affect pleasant  Remains very tenuous. Now with recurrent GI bleed. GI has seen. With CKD IV need to be very careful with contrast administration (only got 30cc with cath yesterday)  Would favor restarting AC as soon as possible for DVT +/- PE burden  Tamera Punt has significant RV failure. Unclear if this is PE related or due to high output state.   Would start oral diuretics. Continue IV amio.   Needs cardiac to look for infiltrative process.   Mobilize.   Prognosis remains concerning,   CRITICAL CARE Performed by: Arvilla Meres  Total critical care time: 40 minutes  Critical care time was exclusive of separately billable procedures and treating other patients.  Critical care was necessary to treat or prevent imminent or life-threatening deterioration.  Critical care was time spent personally by me (independent of midlevel providers or residents) on the following activities: development of treatment plan with patient and/or surrogate as well as nursing, discussions with consultants, evaluation of patient's response to treatment, examination of patient, obtaining history from patient or surrogate, ordering and performing treatments and interventions, ordering and review of laboratory studies, ordering and review of radiographic studies, pulse oximetry and re-evaluation of patient's condition.  Arvilla Meres, MD  2:54 PM

## 2023-07-06 NOTE — Progress Notes (Signed)
 Progress Note   Patient: Billy [name] YQM:[id number] DOB: 1950/10/21 DOA: 06/26/2023     10 DOS: the patient was seen and examined on 07/06/2023   Brief hospital course: 73 y/o male with h/o CHF, CKD stage IV, Atrial Fibrillation, BPH, Anemia, carotid artery disease s/p endarterectomy and stenting, chornic rhinitis, HLD and RLS who went to his neighbors house after having SOB for at least 1 week and had a cardiac arrest. Neighbor stated CPR immediately and EMS continued . He got ROSC back after about 15 min. Then in the ED he arrested again for about 2-5 min and then achieved ROSC. Patient was admitted to the ICU.   Patient found to have cardiogenic shock and required vasopressors.  Echocardiogram with mild reduction in LV systolic function. Positive bilateral deep vein thrombosis.    03/13 direct current cardioversion for atrial fibrillation. Patient noted to have hematochezia with acute blood loss anemia.   03/15 EGD duodenitis.  03/16 IVC filter placement.  03/17 resume IV heparin for anticoagulation. 03/18 completed antibiotic therapy for pneumonia.   Assessment and Plan: Acute on chronic diastolic CHF (congestive heart failure) (HCC) Echocardiogram with preserved LV systolic function with EF 55%, severe LVH, interventricular septum is flattened in systole and diastole, RV systolic function with severe reduction, RA size with mild enlargement, small pericardial effusion.   03/19 cardiac catheterization  3 vessel coronary artery disease with CTO RCA with L > R collaterals and moderate non obstructive coronary artery disease in the left system.  RA 9  RV 24/16 PA 40/31 mean 30  PCWP 14  Cardiac output 7,4 and index 3,5 Fick, 7.3 and 3.5 thermodilution.  PVR 2.2 WU   Acute on chronic core pulmonale Pulmonary hypertension  RV failure   Urine output is 1,700 ml. Systolic blood pressure 130 mmHg range.   Plan to continue hydralazine and spironolactone.  Resume loop diuretic with  torsemide 40 mg daily.   Recovered cardiac arrest, cardiogenic shock. (Resolved) Plan for further work up with cardiac MRI.   Acute hypoxemic respiratory failure due to cardiogenic pulmonary edema.  Oxygenation has improved.   PAF (paroxysmal atrial fibrillation) (HCC) Failure direct current cardioversion, patient is back in atrial fibrillation rhythm.  Plan to continue amiodarone.  Holding heparin due to lower GI bleeding.  Continue telemetry monitoring.   CKD stage 3b, GFR 30-44 ml/min (HCC) AKI, hypernatremia.   Renal function with serum cr at 1,71 with K at 4,2 and serum bicarbonate at 22.  Na 141   Plan to continue close monitoring renal function and electrolytes.  Resumed with torsemide and continue spironolactone.   Hypertension Continue blood pressure control with hydralazine.   Gastrointestinal hemorrhage with melena Endoscopy with erosive gastropathy with no bleeding and no stigmata of recent bleeding.  Bleeding friable duodenal mucosa. Hemostatic spry applied.  Patient will need colonic evaluation, currently not candidate for colonoscopy, but will benefit from CT scan.  If further drop in hgb will obtain scan as inpatient.   Acute blood loss anemia due to upper and possible lower GI  bleeding.   Patient continue to have hematochezia while on anticoagulation.  Follow up Hgb 8.5   Plan to continue pantoprazole Check abdomen and pelvis CT scan for lower GI bleed.  Continue holding heparin for now.     Hyperlipidemia Continue statin therapy.   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 Acute deep vein thrombosis involving the left proximal profunda vein and left  proximal femoral vein.  Indeterminate deep vein thrombosis involving the left popliteal vein, and left middle and distal femoral vein.   SP IV filter.  Holding heparin due to lower GI bleeding.          Subjective: Positive  bright red blood per rectum while on IV heparin, he is very weak and fatigued but able to ambulate in the hallway.   Physical Exam: Vitals:   07/06/23 0900 07/06/23 0930 07/06/23 1000 07/06/23 1127  BP: (!) 140/47 (!) 142/51 (!) 137/43   Pulse: 72 75 79   Resp: 20 20 (!) 26   Temp:    97.9 F (36.6 C)  TempSrc:    Axillary  SpO2: 95% 96% 95%   Weight:      Height:       Neurology awake and alert ENT with mild pallor Cardiovascular with S1 and S2 present and regular with no gallops, rubs or murmurs Respiratory with mild rales at bases with no wheezing or rhonchi Abdomen with no distention and non tender to palpation  Trace lower extremity edema, ted hose in place,  Data Reviewed:    Family Communication: no family at the bedside   Disposition: Status is: Inpatient Remains inpatient appropriate because: recovering cardiac arrest.   Planned Discharge Destination:  to be determined      Author: Coralie Keens, MD 07/06/2023 12:12 PM  For on call review [email or web address].

## 2023-07-06 NOTE — Progress Notes (Signed)
 eLink Physician-Brief Progress Note Patient Name: Billy [name] DOB: 1951-01-26 MRN: [id number]   Date of Service  07/06/2023  HPI/Events of Note  73 year old male with CKD stage IV, A-fib, carotid artery disease status post endarterectomy who was admitted with cardiac arrest and cardiogenic shock. PCCM was consulted for help evaluation and management for bilateral pleural effusion   Patient has received Dilaudid every 3 hours IV consistently.  Had an additional dose of Dilaudid IV for chest tube placement.  Mentating appropriately but still has pain  eICU Interventions  Add scheduled oxycodone for 24 hours     Intervention Category Intermediate Interventions: Pain - evaluation and management  Quamesha Mullet 07/06/2023, 8:31 PM

## 2023-07-06 NOTE — Progress Notes (Signed)
 Subjective: Patient has had bright red blood and maroon-colored stools. He denies abdominal pain.  Objective: Vital signs in last 24 hours: Temp:  [97.9 F (36.6 C)-98.9 F (37.2 C)] 97.9 F (36.6 C) (03/20 1127) Pulse Rate:  [69-127] 78 (03/20 1300) Resp:  [13-31] 25 (03/20 1300) BP: (105-185)/(43-104) 151/57 (03/20 1300) SpO2:  [90 %-99 %] 97 % (03/20 1300) Weight:  [83.3 kg] 83.3 kg (03/20 0500) Weight change: -0.9 kg Last BM Date : 07/05/23  PE: Sitting on bedside chair, appears comfortable GENERAL: Mild pallor, Not in distress ABDOMEN: Nondistended, normoactive bowel sounds EXTREMITIES: No deformity  Lab Results: Results for orders placed or performed during the hospital encounter of 06/26/23 (from the past 48 hours)  Basic metabolic panel     Status: Abnormal   Collection Time: 07/04/23  2:29 PM  Result Value Ref Range   Sodium 144 135 - 145 mmol/L   Potassium 3.9 3.5 - 5.1 mmol/L   Chloride 114 (H) 98 - 111 mmol/L   CO2 21 (L) 22 - 32 mmol/L   Glucose, Bld 135 (H) 70 - 99 mg/dL    Comment: Glucose reference range applies only to samples taken after fasting for at least 8 hours.   BUN 46 (H) 8 - 23 mg/dL   Creatinine, Ser 1.61 (H) 0.61 - 1.24 mg/dL   Calcium 8.3 (L) 8.9 - 10.3 mg/dL   GFR, Estimated 41 (L) >60 mL/min    Comment: (NOTE) Calculated using the CKD-EPI Creatinine Equation (2021)    Anion gap 9 5 - 15    Comment: Performed at [facility], [address], [place], [place] [address]  Magnesium     Status: None   Collection Time: 07/04/23  2:29 PM  Result Value Ref Range   Magnesium 2.2 1.7 - 2.4 mg/dL    Comment: Performed at [facility], [address], [place], [place] [address]  Glucose, capillary     Status: Abnormal   Collection Time: 07/04/23  3:54 PM  Result Value Ref Range   Glucose-Capillary 104 (H) 70 - 99 mg/dL    Comment: Glucose reference range applies only to samples taken after fasting for at least 8 hours.  Heparin  level (unfractionated)     Status: None   Collection Time: 07/04/23  8:41 PM  Result Value Ref Range   Heparin Unfractionated 0.32 0.30 - 0.70 IU/mL    Comment: (NOTE) The clinical reportable range upper limit is being lowered to >1.10 to align with the FDA approved guidance for the current laboratory assay.  If heparin results are below expected values, and patient dosage has  been confirmed, suggest follow up testing of antithrombin III levels. Performed at [facility], [address] [place], [place] [address]   Magnesium     Status: None   Collection Time: 07/04/23  9:00 PM  Result Value Ref Range   Magnesium 2.2 1.7 - 2.4 mg/dL    Comment: Performed at [facility], [address], [place], [place] [address]  Basic metabolic panel     Status: Abnormal   Collection Time: 07/04/23  9:00 PM  Result Value Ref Range   Sodium 144 135 - 145 mmol/L   Potassium 4.0 3.5 - 5.1 mmol/L   Chloride 115 (H) 98 - 111 mmol/L   CO2 22 22 - 32 mmol/L   Glucose, Bld 147 (H) 70 - 99 mg/dL    Comment: Glucose reference range applies only to samples  taken after fasting for at least 8 hours.   BUN 47 (H) 8 - 23 mg/dL   Creatinine, Ser 7.82 (H) 0.61 - 1.24 mg/dL   Calcium 8.4 (L) 8.9 - 10.3 mg/dL   GFR, Estimated 41 (L) >60 mL/min    Comment: (NOTE) Calculated using the CKD-EPI Creatinine Equation (2021)    Anion gap 7 5 - 15    Comment: Performed at [facility], [address] [place], [place] [address]  Lactic acid, plasma     Status: None   Collection Time: 07/04/23  9:00 PM  Result Value Ref Range   Lactic Acid, Venous 1.5 0.5 - 1.9 mmol/L    Comment: Performed at [facility], [address], [place], [place] [address]  Glucose, capillary     Status: Abnormal   Collection Time: 07/04/23  9:02 PM  Result Value Ref Range   Glucose-Capillary 126 (H) 70 - 99 mg/dL    Comment: Glucose reference range applies only to samples taken after fasting for at least 8  hours.  Glucose, capillary     Status: Abnormal   Collection Time: 07/05/23 12:56 AM  Result Value Ref Range   Glucose-Capillary 117 (H) 70 - 99 mg/dL    Comment: Glucose reference range applies only to samples taken after fasting for at least 8 hours.  Glucose, capillary     Status: Abnormal   Collection Time: 07/05/23  4:23 AM  Result Value Ref Range   Glucose-Capillary 117 (H) 70 - 99 mg/dL    Comment: Glucose reference range applies only to samples taken after fasting for at least 8 hours.  Cooxemetry Panel (carboxy, met, total hgb, O2 sat)     Status: Abnormal   Collection Time: 07/05/23  4:33 AM  Result Value Ref Range   Total hemoglobin 9.7 (L) 12.0 - 16.0 g/dL   O2 Saturation 78.4 %   Carboxyhemoglobin 2.4 (H) 0.5 - 1.5 %   Methemoglobin <0.7 0.0 - 1.5 %    Comment: Performed at [facility], [address], [place], [place] [address]  CBC     Status: Abnormal   Collection Time: 07/05/23  4:33 AM  Result Value Ref Range   WBC 17.9 (H) 4.0 - 10.5 K/uL   RBC 3.65 (L) 4.22 - 5.81 MIL/uL   Hemoglobin 8.9 (L) 13.0 - 17.0 g/dL   HCT 52.8 (L) 41.3 - 24.4 %   MCV 80.8 80.0 - 100.0 fL   MCH 24.4 (L) 26.0 - 34.0 pg   MCHC 30.2 30.0 - 36.0 g/dL   RDW 01.0 (H) 27.2 - 53.6 %   Platelets 280 150 - 400 K/uL   nRBC 0.3 (H) 0.0 - 0.2 %    Comment: Performed at [facility], [address] [place], [place] [address]  Comprehensive metabolic panel     Status: Abnormal   Collection Time: 07/05/23  4:33 AM  Result Value Ref Range   Sodium 142 135 - 145 mmol/L   Potassium 4.1 3.5 - 5.1 mmol/L   Chloride 112 (H) 98 - 111 mmol/L   CO2 22 22 - 32 mmol/L   Glucose, Bld 123 (H) 70 - 99 mg/dL    Comment: Glucose reference range applies only to samples taken after fasting for at least 8 hours.   BUN 47 (H) 8 - 23 mg/dL   Creatinine, Ser 4.74 (H) 0.61 - 1.24 mg/dL   Calcium 8.5 (L) 8.9 - 10.3 mg/dL  Total Protein 4.6 (L) 6.5 - 8.1 g/dL   Albumin 1.8 (L) 3.5 - 5.0 g/dL   AST 24  15 - 41 U/L   ALT 115 (H) 0 - 44 U/L   Alkaline Phosphatase 80 38 - 126 U/L   Total Bilirubin 0.8 0.0 - 1.2 mg/dL   GFR, Estimated 41 (L) >60 mL/min    Comment: (NOTE) Calculated using the CKD-EPI Creatinine Equation (2021)    Anion gap 8 5 - 15    Comment: Performed at [facility], [address] [place], [place] [address]  Heparin level (unfractionated)     Status: Abnormal   Collection Time: 07/05/23  4:33 AM  Result Value Ref Range   Heparin Unfractionated 0.12 (L) 0.30 - 0.70 IU/mL    Comment: (NOTE) The clinical reportable range upper limit is being lowered to >1.10 to align with the FDA approved guidance for the current laboratory assay.  If heparin results are below expected values, and patient dosage has  been confirmed, suggest follow up testing of antithrombin III levels. Performed at [facility], [address], [place], [place] [address]   Magnesium     Status: None   Collection Time: 07/05/23  4:33 AM  Result Value Ref Range   Magnesium 2.2 1.7 - 2.4 mg/dL    Comment: Performed at [facility], [address] [place], [place] [address]  Glucose, capillary     Status: Abnormal   Collection Time: 07/05/23  7:34 AM  Result Value Ref Range   Glucose-Capillary 122 (H) 70 - 99 mg/dL    Comment: Glucose reference range applies only to samples taken after fasting for at least 8 hours.  Glucose, capillary     Status: Abnormal   Collection Time: 07/05/23 11:26 AM  Result Value Ref Range   Glucose-Capillary 116 (H) 70 - 99 mg/dL    Comment: Glucose reference range applies only to samples taken after fasting for at least 8 hours.  Hemoglobin and hematocrit, blood     Status: Abnormal   Collection Time: 07/05/23  1:59 PM  Result Value Ref Range   Hemoglobin 9.2 (L) 13.0 - 17.0 g/dL   HCT 13.0 (L) 86.5 - 78.4 %    Comment: Performed at [facility], [address], [place], [place] [address]  POCT I-Stat EG7     Status: Abnormal   Collection  Time: 07/05/23  4:25 PM  Result Value Ref Range   pH, Ven 7.352 7.25 - 7.43   pCO2, Ven 30.7 (L) 44 - 60 mmHg   pO2, Ven 35 32 - 45 mmHg   Bicarbonate 17.0 (L) 20.0 - 28.0 mmol/L   TCO2 18 (L) 22 - 32 mmol/L   O2 Saturation 66 %   Acid-base deficit 8.0 (H) 0.0 - 2.0 mmol/L   Sodium 150 (H) 135 - 145 mmol/L   Potassium 3.0 (L) 3.5 - 5.1 mmol/L   Calcium, Ion 0.85 (LL) 1.15 - 1.40 mmol/L   HCT 23.0 (L) 39.0 - 52.0 %   Hemoglobin 7.8 (L) 13.0 - 17.0 g/dL   Sample type VENOUS   POCT I-Stat EG7     Status: Abnormal   Collection Time: 07/05/23  4:25 PM  Result Value Ref Range   pH, Ven 7.375 7.25 - 7.43   pCO2, Ven 35.2 (L) 44 - 60 mmHg   pO2, Ven 33 32 - 45 mmHg   Bicarbonate 20.6 20.0 - 28.0 mmol/L   TCO2 22  22 - 32 mmol/L   O2 Saturation 63 %   Acid-base deficit 4.0 (H) 0.0 - 2.0 mmol/L   Sodium 145 135 - 145 mmol/L   Potassium 3.8 3.5 - 5.1 mmol/L   Calcium, Ion 1.12 (L) 1.15 - 1.40 mmol/L   HCT 28.0 (L) 39.0 - 52.0 %   Hemoglobin 9.5 (L) 13.0 - 17.0 g/dL   Sample type VENOUS   I-STAT 7, (LYTES, BLD GAS, ICA, H+H)     Status: Abnormal   Collection Time: 07/05/23  4:46 PM  Result Value Ref Range   pH, Arterial 7.395 7.35 - 7.45   pCO2 arterial 32.9 32 - 48 mmHg   pO2, Arterial 81 (L) 83 - 108 mmHg   Bicarbonate 20.1 20.0 - 28.0 mmol/L   TCO2 21 (L) 22 - 32 mmol/L   O2 Saturation 96 %   Acid-base deficit 4.0 (H) 0.0 - 2.0 mmol/L   Sodium 144 135 - 145 mmol/L   Potassium 4.1 3.5 - 5.1 mmol/L   Calcium, Ion 1.22 1.15 - 1.40 mmol/L   HCT 29.0 (L) 39.0 - 52.0 %   Hemoglobin 9.9 (L) 13.0 - 17.0 g/dL   Sample type ARTERIAL   Glucose, capillary     Status: None   Collection Time: 07/05/23  5:29 PM  Result Value Ref Range   Glucose-Capillary 86 70 - 99 mg/dL    Comment: Glucose reference range applies only to samples taken after fasting for at least 8 hours.  Glucose, capillary     Status: None   Collection Time: 07/05/23  8:17 PM  Result Value Ref Range   Glucose-Capillary  98 70 - 99 mg/dL    Comment: Glucose reference range applies only to samples taken after fasting for at least 8 hours.  Glucose, capillary     Status: Abnormal   Collection Time: 07/05/23 11:51 PM  Result Value Ref Range   Glucose-Capillary 113 (H) 70 - 99 mg/dL    Comment: Glucose reference range applies only to samples taken after fasting for at least 8 hours.  Glucose, capillary     Status: Abnormal   Collection Time: 07/06/23  4:13 AM  Result Value Ref Range   Glucose-Capillary 106 (H) 70 - 99 mg/dL    Comment: Glucose reference range applies only to samples taken after fasting for at least 8 hours.  Cooxemetry Panel (carboxy, met, total hgb, O2 sat)     Status: Abnormal   Collection Time: 07/06/23  5:09 AM  Result Value Ref Range   Total hemoglobin 8.5 (L) 12.0 - 16.0 g/dL   O2 Saturation 85 %   Carboxyhemoglobin 2.0 (H) 0.5 - 1.5 %   Methemoglobin 0.8 0.0 - 1.5 %    Comment: Performed at [facility], [address] [place], [place] [address]  CBC     Status: Abnormal   Collection Time: 07/06/23  5:10 AM  Result Value Ref Range   WBC 16.0 (H) 4.0 - 10.5 K/uL   RBC 3.33 (L) 4.22 - 5.81 MIL/uL   Hemoglobin 7.9 (L) 13.0 - 17.0 g/dL   HCT 60.4 (L) 54.0 - 98.1 %   MCV 81.1 80.0 - 100.0 fL   MCH 23.7 (L) 26.0 - 34.0 pg   MCHC 29.3 (L) 30.0 - 36.0 g/dL   RDW 19.1 (H) 47.8 - 29.5 %   Platelets 325 150 - 400 K/uL   nRBC 0.2 0.0 - 0.2 %    Comment: Performed at [facility],  [address] [place], [place] [address]  Comprehensive metabolic panel     Status: Abnormal   Collection Time: 07/06/23  5:10 AM  Result Value Ref Range   Sodium 141 135 - 145 mmol/L   Potassium 4.2 3.5 - 5.1 mmol/L   Chloride 113 (H) 98 - 111 mmol/L   CO2 22 22 - 32 mmol/L   Glucose, Bld 112 (H) 70 - 99 mg/dL    Comment: Glucose reference range applies only to samples taken after fasting for at least 8 hours.   BUN 47 (H) 8 - 23 mg/dL   Creatinine, Ser 9.62 (H) 0.61 - 1.24 mg/dL   Calcium  8.4 (L) 8.9 - 10.3 mg/dL   Total Protein 4.7 (L) 6.5 - 8.1 g/dL   Albumin 1.8 (L) 3.5 - 5.0 g/dL   AST 24 15 - 41 U/L   ALT 83 (H) 0 - 44 U/L   Alkaline Phosphatase 79 38 - 126 U/L   Total Bilirubin 0.7 0.0 - 1.2 mg/dL   GFR, Estimated 43 (L) >60 mL/min    Comment: (NOTE) Calculated using the CKD-EPI Creatinine Equation (2021)    Anion gap 6 5 - 15    Comment: Performed at [facility], [address] [place], [place] [address]  Heparin level (unfractionated)     Status: Abnormal   Collection Time: 07/06/23  5:49 AM  Result Value Ref Range   Heparin Unfractionated <0.10 (L) 0.30 - 0.70 IU/mL    Comment: (NOTE) The clinical reportable range upper limit is being lowered to >1.10 to align with the FDA approved guidance for the current laboratory assay.  If heparin results are below expected values, and patient dosage has  been confirmed, suggest follow up testing of antithrombin III levels. Performed at [facility], [address] [place], [place] [address]   Glucose, capillary     Status: Abnormal   Collection Time: 07/06/23  7:58 AM  Result Value Ref Range   Glucose-Capillary 107 (H) 70 - 99 mg/dL    Comment: Glucose reference range applies only to samples taken after fasting for at least 8 hours.  Hemoglobin and hematocrit, blood     Status: Abnormal   Collection Time: 07/06/23  9:57 AM  Result Value Ref Range   Hemoglobin 8.5 (L) 13.0 - 17.0 g/dL   HCT 01.0 (L) 27.2 - 53.6 %    Comment: Performed at [facility], [address] [place], [place] [address]  Glucose, capillary     Status: None   Collection Time: 07/06/23 11:30 AM  Result Value Ref Range   Glucose-Capillary 99 70 - 99 mg/dL    Comment: Glucose reference range applies only to samples taken after fasting for at least 8 hours.    Studies/Results: CARDIAC CATHETERIZATION Result Date: 07/05/2023   1st Mrg lesion is 70% stenosed.   Prox LAD to Mid LAD lesion is 50% stenosed.   Dist LAD  lesion is 60% stenosed.   Prox RCA lesion is 100% stenosed. Findings: Ao = 125/65 (91) LV = 140/10 RA = 9 RV = 24/16 PA = 40/31 (30) PCW = 14 Fick cardiac output/index = 7.4/3.5 Thermo CO/CI = 7.3/3.5 PVR = 2.2 WU Ao sat = 96% PA sat = 65% PAPi = 1.0 Assessment: 1. 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 Mild PAH with severe RV failure though cardiac output is preserved Plan/Discussion: Medical therapy Arvilla Meres,  MD 5:06 PM  [place] EKG SITE RITE Result Date: 07/05/2023 If Site Rite image not attached, placement could not be confirmed due to current cardiac rhythm.  US RENAL Result Date: 07/04/2023 CLINICAL DATA:  Acute kidney injury EXAM: RENAL / URINARY TRACT ULTRASOUND COMPLETE COMPARISON:  04/13/2018 FINDINGS: Right Kidney: Renal measurements: 12.0 x 5.4 x 6.0 cm = volume: 206 mL. Echogenicity within normal limits. No mass or hydronephrosis visualized. Left Kidney: Renal measurements: Not visualized. Bladder: Foley catheter in place, decompressed. Other: None. IMPRESSION: No acute findings within the right kidney.  No hydronephrosis. Left kidney not visualized. Electronically Signed   By: Charlett Nose M.D.   On: 07/04/2023 20:22   VAS [place] UPPER EXTREMITY VENOUS DUPLEX Result Date: 07/04/2023 UPPER VENOUS STUDY  Patient Name:  Billy [name]  Date of Exam:   07/04/2023 Medical Rec #: [id number]   Accession #:    [id number] Date of Birth: 08-Aug-1950   Patient Gender: M Patient Age:   73 years Exam Location:  [facility] Procedure:      VAS [place] UPPER EXTREMITY VENOUS DUPLEX Referring Phys: ALMA DIAZ --------------------------------------------------------------------------------  Indications: Rule out DVT, Other Indications: H/O DVT in the lower extremities. Comparison Study: No prior exam. Performing Technologist: Fernande Bras  Examination Guidelines: A complete evaluation includes B-mode imaging, spectral Doppler, color Doppler, and power  Doppler as needed of all accessible portions of each vessel. Bilateral testing is considered an integral part of a complete examination. Limited examinations for reoccurring indications may be performed as noted.  Right Findings: +----------+------------+---------+-----------+----------+-----------------+ RIGHT     CompressiblePhasicitySpontaneousProperties     Summary      +----------+------------+---------+-----------+----------+-----------------+ IJV           Full       Yes       Yes                                +----------+------------+---------+-----------+----------+-----------------+ Subclavian    Full       Yes       Yes                                +----------+------------+---------+-----------+----------+-----------------+ Axillary      Full       Yes       Yes                                +----------+------------+---------+-----------+----------+-----------------+ Brachial    Partial      No        No                                 +----------+------------+---------+-----------+----------+-----------------+ Radial                                               Not visualized   +----------+------------+---------+-----------+----------+-----------------+ Ulnar         Full       Yes       Yes                                +----------+------------+---------+-----------+----------+-----------------+  Cephalic      None       No        No                     Acute       +----------+------------+---------+-----------+----------+-----------------+ Basilic       None       No        No               Age Indeterminate +----------+------------+---------+-----------+----------+-----------------+ Thrombus noted in only one the distal left brachial veins.  Left Findings: +----------+------------+---------+-----------+----------+-----------------+ LEFT      CompressiblePhasicitySpontaneousProperties     Summary       +----------+------------+---------+-----------+----------+-----------------+ IJV           Full       Yes       Yes                                +----------+------------+---------+-----------+----------+-----------------+ Subclavian                                           Not visualized   +----------+------------+---------+-----------+----------+-----------------+ Axillary      Full       Yes       Yes                                +----------+------------+---------+-----------+----------+-----------------+ Brachial      Full       Yes       Yes                                +----------+------------+---------+-----------+----------+-----------------+ Radial        Full                                                    +----------+------------+---------+-----------+----------+-----------------+ Ulnar         Full                                                    +----------+------------+---------+-----------+----------+-----------------+ Cephalic      None       No        No               Age Indeterminate +----------+------------+---------+-----------+----------+-----------------+ Basilic       None       No        No                     Acute       +----------+------------+---------+-----------+----------+-----------------+  Summary:  Right: Findings consistent with acute deep vein thrombosis involving the right brachial veins. Findings consistent with acute superficial vein thrombosis involving the right cephalic vein. Findings consistent with age indeterminate superficial vein thrombosis involving the right basilic vein.  Left: No evidence of deep vein thrombosis in the upper  extremity. Findings consistent with acute superficial vein thrombosis involving the left basilic vein. Findings consistent with age indeterminate superficial vein thrombosis involving the left cephalic vein.  *See table(s) above for measurements and observations.  Diagnosing  physician: Lemar Livings MD Electronically signed by Lemar Livings MD on 07/04/2023 at 3:44:47 PM.    Final     Medications: I have reviewed the patient's current medications.  Assessment: Recurrent rectal bleeding described as bright red blood and maroon clots times few episodes Without associated abdominal pain   Hemoglobin 8.9/9.2/9.5/9.9/7.9/8.5 Duodenal deformity and friable mucosa noted on EGD treated with purastat gel BUN stable at 47, creatinine 1.71 with a GFR of 43  Witnessed cardiac arrest, status post CPR, ROSC, arrested again and then achieved ROSC Atrial fibrillation, cardioversion on 06/29/2023 IVC filter placed on 07/02/2023 IV heparin resumed on 07/03/2023, on hold now with recurrent GI bleed  Plan: Recommend CT angio of the abdomen and pelvis. This could be lower GI bleed, possibly related to diverticular disease as it was painless hematochezia. Fortunately he is hemodynamically stable and has not needed PRBC transfusion since 06/30/2023 Will follow results of CT angio. On pantoprazole 40 mg every 6 hours for 72 hours and then every 12 hours thereafter. Currently on heart healthy diet, MiraLAX as needed, Colace, Dilaudid.  Kerin Salen, MD 07/06/2023, 1:33 PM
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
## 2023-07-07 ENCOUNTER — Inpatient Hospital Stay (HOSPITAL_COMMUNITY)

## 2023-07-07 DIAGNOSIS — I480 Paroxysmal atrial fibrillation: Secondary | ICD-10-CM | POA: Diagnosis not present

## 2023-07-07 DIAGNOSIS — J90 Pleural effusion, not elsewhere classified: Secondary | ICD-10-CM | POA: Diagnosis not present

## 2023-07-07 DIAGNOSIS — I469 Cardiac arrest, cause unspecified: Secondary | ICD-10-CM | POA: Diagnosis not present

## 2023-07-07 DIAGNOSIS — I340 Nonrheumatic mitral (valve) insufficiency: Secondary | ICD-10-CM

## 2023-07-07 DIAGNOSIS — N1832 Chronic kidney disease, stage 3b: Secondary | ICD-10-CM | POA: Diagnosis not present

## 2023-07-07 DIAGNOSIS — I5033 Acute on chronic diastolic (congestive) heart failure: Secondary | ICD-10-CM | POA: Diagnosis not present

## 2023-07-07 LAB — COOXEMETRY PANEL
Carboxyhemoglobin: 3.1 % — ABNORMAL HIGH (ref 0.5–1.5)
Methemoglobin: 1.5 % (ref 0.0–1.5)
O2 Saturation: 90 %
Total hemoglobin: 8.1 g/dL — ABNORMAL LOW (ref 12.0–16.0)

## 2023-07-07 LAB — CBC
HCT: 25.8 % — ABNORMAL LOW (ref 39.0–52.0)
Hemoglobin: 7.7 g/dL — ABNORMAL LOW (ref 13.0–17.0)
MCH: 24.1 pg — ABNORMAL LOW (ref 26.0–34.0)
MCHC: 29.8 g/dL — ABNORMAL LOW (ref 30.0–36.0)
MCV: 80.6 fL (ref 80.0–100.0)
Platelets: 341 10*3/uL (ref 150–400)
RBC: 3.2 MIL/uL — ABNORMAL LOW (ref 4.22–5.81)
RDW: 19.2 % — ABNORMAL HIGH (ref 11.5–15.5)
WBC: 16.3 10*3/uL — ABNORMAL HIGH (ref 4.0–10.5)
nRBC: 0.2 % (ref 0.0–0.2)

## 2023-07-07 LAB — GLUCOSE, CAPILLARY
Glucose-Capillary: 110 mg/dL — ABNORMAL HIGH (ref 70–99)
Glucose-Capillary: 116 mg/dL — ABNORMAL HIGH (ref 70–99)
Glucose-Capillary: 121 mg/dL — ABNORMAL HIGH (ref 70–99)
Glucose-Capillary: 127 mg/dL — ABNORMAL HIGH (ref 70–99)
Glucose-Capillary: 132 mg/dL — ABNORMAL HIGH (ref 70–99)
Glucose-Capillary: 137 mg/dL — ABNORMAL HIGH (ref 70–99)
Glucose-Capillary: 139 mg/dL — ABNORMAL HIGH (ref 70–99)

## 2023-07-07 LAB — COMPREHENSIVE METABOLIC PANEL
ALT: 68 U/L — ABNORMAL HIGH (ref 0–44)
AST: 27 U/L (ref 15–41)
Albumin: 1.8 g/dL — ABNORMAL LOW (ref 3.5–5.0)
Alkaline Phosphatase: 86 U/L (ref 38–126)
Anion gap: 8 (ref 5–15)
BUN: 58 mg/dL — ABNORMAL HIGH (ref 8–23)
CO2: 22 mmol/L (ref 22–32)
Calcium: 8.5 mg/dL — ABNORMAL LOW (ref 8.9–10.3)
Chloride: 112 mmol/L — ABNORMAL HIGH (ref 98–111)
Creatinine, Ser: 2.02 mg/dL — ABNORMAL HIGH (ref 0.61–1.24)
GFR, Estimated: 35 mL/min — ABNORMAL LOW (ref >60)
Glucose, Bld: 126 mg/dL — ABNORMAL HIGH (ref 70–99)
Potassium: 4.2 mmol/L (ref 3.5–5.1)
Sodium: 142 mmol/L (ref 135–145)
Total Bilirubin: 0.6 mg/dL (ref 0.0–1.2)
Total Protein: 4.7 g/dL — ABNORMAL LOW (ref 6.5–8.1)

## 2023-07-07 LAB — HEPARIN LEVEL (UNFRACTIONATED): Heparin Unfractionated: <0.1 [IU]/mL — ABNORMAL LOW (ref 0.30–0.70)

## 2023-07-07 MED ORDER — ADULT MULTIVITAMIN W/MINERALS CH
1.0000 | ORAL_TABLET | Freq: Every day | ORAL | Status: DC
  Administered 2023-07-07 – 2023-07-20 (×14): 1 via ORAL
  Filled 2023-07-07 (×14): qty 1

## 2023-07-07 MED ORDER — PANTOPRAZOLE SODIUM 40 MG PO TBEC
40.0000 mg | DELAYED_RELEASE_TABLET | Freq: Two times a day (BID) | ORAL | Status: DC
  Administered 2023-07-07 – 2023-07-20 (×26): 40 mg via ORAL
  Filled 2023-07-07 (×26): qty 1

## 2023-07-07 MED ORDER — HEPARIN (PORCINE) 25000 UT/250ML-% IV SOLN
1850.0000 [IU]/h | INTRAVENOUS | Status: DC
  Administered 2023-07-07: 1000 [IU]/h via INTRAVENOUS
  Administered 2023-07-08: 1200 [IU]/h via INTRAVENOUS
  Administered 2023-07-09: 1550 [IU]/h via INTRAVENOUS
  Administered 2023-07-09: 1300 [IU]/h via INTRAVENOUS
  Administered 2023-07-10: 1650 [IU]/h via INTRAVENOUS
  Administered 2023-07-11: 1850 [IU]/h via INTRAVENOUS
  Filled 2023-07-07 (×7): qty 250

## 2023-07-07 MED ORDER — GADOBUTROL 1 MMOL/ML IV SOLN
10.0000 mL | Freq: Once | INTRAVENOUS | Status: AC | PRN
  Administered 2023-07-07: 10 mL via INTRAVENOUS
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
## 2023-07-07 NOTE — Plan of Care (Signed)
  Problem: Fluid Volume: Goal: Ability to maintain a balanced intake and output will improve Outcome: Progressing   Problem: Nutritional: Goal: Maintenance of adequate nutrition will improve Outcome: Progressing   Problem: Skin Integrity: Goal: Risk for impaired skin integrity will decrease Outcome: Progressing

## 2023-07-07 NOTE — Progress Notes (Signed)
 Subjective: States he feels weak and tired. Has only been able to drink Ensure, has a very reduced appetite, has not slept much. Denies abdominal pain.  Objective: Vital signs in last 24 hours: Temp:  [97.9 F (36.6 C)-98.2 F (36.8 C)] 98 F (36.7 C) (03/21 0400) Pulse Rate:  [68-99] 70 (03/21 0700) Resp:  [12-26] 22 (03/21 0700) BP: (73-153)/(29-67) 132/67 (03/21 0700) SpO2:  [93 %-97 %] 97 % (03/21 0700) Weight:  [83.3 kg] 83.3 kg (03/21 0500) Weight change: -1.1 kg Last BM Date : 07/05/23  PE: Frail, ill-appearing GENERAL: Lying on bed, awake, oriented  ABDOMEN: Not distended, nontender, hyperactive bowel sounds EXTREMITIES: No deformity  Lab Results: Results for orders placed or performed during the hospital encounter of 06/26/23 (from the past 48 hours)  Glucose, capillary     Status: Abnormal   Collection Time: 07/05/23 11:26 AM  Result Value Ref Range   Glucose-Capillary 116 (H) 70 - 99 mg/dL    Comment: Glucose reference range applies only to samples taken after fasting for at least 8 hours.  Hemoglobin and hematocrit, blood     Status: Abnormal   Collection Time: 07/05/23  1:59 PM  Result Value Ref Range   Hemoglobin 9.2 (L) 13.0 - 17.0 g/dL   HCT 78.2 (L) 95.6 - 21.3 %    Comment: Performed at [facility], [address], [place], [place] [address]  POCT I-Stat EG7     Status: Abnormal   Collection Time: 07/05/23  4:25 PM  Result Value Ref Range   pH, Ven 7.352 7.25 - 7.43   pCO2, Ven 30.7 (L) 44 - 60 mmHg   pO2, Ven 35 32 - 45 mmHg   Bicarbonate 17.0 (L) 20.0 - 28.0 mmol/L   TCO2 18 (L) 22 - 32 mmol/L   O2 Saturation 66 %   Acid-base deficit 8.0 (H) 0.0 - 2.0 mmol/L   Sodium 150 (H) 135 - 145 mmol/L   Potassium 3.0 (L) 3.5 - 5.1 mmol/L   Calcium, Ion 0.85 (LL) 1.15 - 1.40 mmol/L   HCT 23.0 (L) 39.0 - 52.0 %   Hemoglobin 7.8 (L) 13.0 - 17.0 g/dL   Sample type VENOUS   POCT I-Stat EG7     Status: Abnormal   Collection Time: 07/05/23  4:25 PM   Result Value Ref Range   pH, Ven 7.375 7.25 - 7.43   pCO2, Ven 35.2 (L) 44 - 60 mmHg   pO2, Ven 33 32 - 45 mmHg   Bicarbonate 20.6 20.0 - 28.0 mmol/L   TCO2 22 22 - 32 mmol/L   O2 Saturation 63 %   Acid-base deficit 4.0 (H) 0.0 - 2.0 mmol/L   Sodium 145 135 - 145 mmol/L   Potassium 3.8 3.5 - 5.1 mmol/L   Calcium, Ion 1.12 (L) 1.15 - 1.40 mmol/L   HCT 28.0 (L) 39.0 - 52.0 %   Hemoglobin 9.5 (L) 13.0 - 17.0 g/dL   Sample type VENOUS   I-STAT 7, (LYTES, BLD GAS, ICA, H+H)     Status: Abnormal   Collection Time: 07/05/23  4:46 PM  Result Value Ref Range   pH, Arterial 7.395 7.35 - 7.45   pCO2 arterial 32.9 32 - 48 mmHg   pO2, Arterial 81 (L) 83 - 108 mmHg   Bicarbonate 20.1 20.0 - 28.0 mmol/L   TCO2 21 (L) 22 - 32 mmol/L   O2 Saturation 96 %   Acid-base deficit 4.0 (H) 0.0 - 2.0 mmol/L  Sodium 144 135 - 145 mmol/L   Potassium 4.1 3.5 - 5.1 mmol/L   Calcium, Ion 1.22 1.15 - 1.40 mmol/L   HCT 29.0 (L) 39.0 - 52.0 %   Hemoglobin 9.9 (L) 13.0 - 17.0 g/dL   Sample type ARTERIAL   Glucose, capillary     Status: None   Collection Time: 07/05/23  5:29 PM  Result Value Ref Range   Glucose-Capillary 86 70 - 99 mg/dL    Comment: Glucose reference range applies only to samples taken after fasting for at least 8 hours.  Glucose, capillary     Status: None   Collection Time: 07/05/23  8:17 PM  Result Value Ref Range   Glucose-Capillary 98 70 - 99 mg/dL    Comment: Glucose reference range applies only to samples taken after fasting for at least 8 hours.  Glucose, capillary     Status: Abnormal   Collection Time: 07/05/23 11:51 PM  Result Value Ref Range   Glucose-Capillary 113 (H) 70 - 99 mg/dL    Comment: Glucose reference range applies only to samples taken after fasting for at least 8 hours.  Glucose, capillary     Status: Abnormal   Collection Time: 07/06/23  4:13 AM  Result Value Ref Range   Glucose-Capillary 106 (H) 70 - 99 mg/dL    Comment: Glucose reference range applies only  to samples taken after fasting for at least 8 hours.  Cooxemetry Panel (carboxy, met, total hgb, O2 sat)     Status: Abnormal   Collection Time: 07/06/23  5:09 AM  Result Value Ref Range   Total hemoglobin 8.5 (L) 12.0 - 16.0 g/dL   O2 Saturation 85 %   Carboxyhemoglobin 2.0 (H) 0.5 - 1.5 %   Methemoglobin 0.8 0.0 - 1.5 %    Comment: Performed at [facility], [address], [place], [place] [address]  CBC     Status: Abnormal   Collection Time: 07/06/23  5:10 AM  Result Value Ref Range   WBC 16.0 (H) 4.0 - 10.5 K/uL   RBC 3.33 (L) 4.22 - 5.81 MIL/uL   Hemoglobin 7.9 (L) 13.0 - 17.0 g/dL   HCT 19.1 (L) 47.8 - 29.5 %   MCV 81.1 80.0 - 100.0 fL   MCH 23.7 (L) 26.0 - 34.0 pg   MCHC 29.3 (L) 30.0 - 36.0 g/dL   RDW 62.1 (H) 30.8 - 65.7 %   Platelets 325 150 - 400 K/uL   nRBC 0.2 0.0 - 0.2 %    Comment: Performed at [facility], [address], [place], [place] [address]  Comprehensive metabolic panel     Status: Abnormal   Collection Time: 07/06/23  5:10 AM  Result Value Ref Range   Sodium 141 135 - 145 mmol/L   Potassium 4.2 3.5 - 5.1 mmol/L   Chloride 113 (H) 98 - 111 mmol/L   CO2 22 22 - 32 mmol/L   Glucose, Bld 112 (H) 70 - 99 mg/dL    Comment: Glucose reference range applies only to samples taken after fasting for at least 8 hours.   BUN 47 (H) 8 - 23 mg/dL   Creatinine, Ser 2.95 (H) 0.61 - 1.24 mg/dL   Calcium 8.4 (L) 8.9 - 10.3 mg/dL   Total Protein 4.7 (L) 6.5 - 8.1 g/dL   Albumin 1.8 (L) 3.5 - 5.0 g/dL   AST 24 15 - 41 U/L   ALT 83 (H) 0 - 44 U/L   Alkaline  Phosphatase 79 38 - 126 U/L   Total Bilirubin 0.7 0.0 - 1.2 mg/dL   GFR, Estimated 43 (L) >60 mL/min    Comment: (NOTE) Calculated using the CKD-EPI Creatinine Equation (2021)    Anion gap 6 5 - 15    Comment: Performed at [facility], [address], [place], [place] [address]  Heparin level (unfractionated)     Status: Abnormal   Collection Time: 07/06/23  5:49 AM  Result Value Ref Range    Heparin Unfractionated <0.10 (L) 0.30 - 0.70 IU/mL    Comment: (NOTE) The clinical reportable range upper limit is being lowered to >1.10 to align with the FDA approved guidance for the current laboratory assay.  If heparin results are below expected values, and patient dosage has  been confirmed, suggest follow up testing of antithrombin III levels. Performed at [facility], [address], [place], [place] [address]   Glucose, capillary     Status: Abnormal   Collection Time: 07/06/23  7:58 AM  Result Value Ref Range   Glucose-Capillary 107 (H) 70 - 99 mg/dL    Comment: Glucose reference range applies only to samples taken after fasting for at least 8 hours.  Hemoglobin and hematocrit, blood     Status: Abnormal   Collection Time: 07/06/23  9:57 AM  Result Value Ref Range   Hemoglobin 8.5 (L) 13.0 - 17.0 g/dL   HCT 82.9 (L) 56.2 - 13.0 %    Comment: Performed at [facility], [address], [place], [place] [address]  Glucose, capillary     Status: None   Collection Time: 07/06/23 11:30 AM  Result Value Ref Range   Glucose-Capillary 99 70 - 99 mg/dL    Comment: Glucose reference range applies only to samples taken after fasting for at least 8 hours.  Glucose, capillary     Status: None   Collection Time: 07/06/23  3:55 PM  Result Value Ref Range   Glucose-Capillary 92 70 - 99 mg/dL    Comment: Glucose reference range applies only to samples taken after fasting for at least 8 hours.  Hemoglobin and hematocrit, blood     Status: Abnormal   Collection Time: 07/06/23  4:14 PM  Result Value Ref Range   Hemoglobin 8.3 (L) 13.0 - 17.0 g/dL   HCT 46.9 (L) 62.9 - 52.8 %    Comment: Performed at [facility], [address], [place], [place] [address]  Body fluid cell count with differential     Status: Abnormal   Collection Time: 07/06/23  6:38 PM  Result Value Ref Range   Fluid Type-FCT FLUID     Comment: LEFT PLEURAL    Color, Fluid YELLOW     Appearance, Fluid HAZY (A) CLEAR   Total Nucleated Cell Count, Fluid 223 0 - 1,000 cu mm   Neutrophil Count, Fluid 2 0 - 25 %   Lymphs, Fluid 95 %   Monocyte-Macrophage-Serous Fluid 3 (L) 50 - 90 %   Eos, Fluid 0 %    Comment: Performed at [facility], [address] [place], [place] [address]  Lactate dehydrogenase (pleural or peritoneal fluid)     Status: Abnormal   Collection Time: 07/06/23  6:38 PM  Result Value Ref Range   LD, Fluid 115 (H) 3 - 23 U/L    Comment: (NOTE) Results should be evaluated in conjunction with serum values    Fluid Type-FLDH FLUID  Comment: LEFT PLEURAL Performed at [facility], [address] [place], [place] [address]   Glucose, pleural or peritoneal fluid     Status: None   Collection Time: 07/06/23  6:38 PM  Result Value Ref Range   Glucose, Fluid 108 mg/dL    Comment: (NOTE) No normal range established for this test Results should be evaluated in conjunction with serum values    Fluid Type-FGLU FLUID     Comment: LEFT PLEURAL Performed at [facility], [address] [place], [place] [address]   Protein, pleural or peritoneal fluid     Status: None   Collection Time: 07/06/23  6:38 PM  Result Value Ref Range   Total protein, fluid <3.0 g/dL    Comment: (NOTE) No normal range established for this test Results should be evaluated in conjunction with serum values    Fluid Type-FTP FLUID     Comment: LEFT PLEURAL Performed at [facility], [address] [place], [place] [address]   Body fluid culture w Gram Stain     Status: None (Preliminary result)   Collection Time: 07/06/23  6:39 PM   Specimen: Pleural Fluid  Result Value Ref Range   Specimen Description PLEURAL    Special Requests LEFT LUNG    Gram Stain NO WBC SEEN NO ORGANISMS SEEN CYTOSPIN SMEAR     Culture      NO GROWTH < 12 HOURS Performed at [facility], [address], [place], [place] [address]    Report Status PENDING    Glucose, capillary     Status: Abnormal   Collection Time: 07/06/23  8:27 PM  Result Value Ref Range   Glucose-Capillary 140 (H) 70 - 99 mg/dL    Comment: Glucose reference range applies only to samples taken after fasting for at least 8 hours.  Glucose, capillary     Status: Abnormal   Collection Time: 07/07/23 12:13 AM  Result Value Ref Range   Glucose-Capillary 110 (H) 70 - 99 mg/dL    Comment: Glucose reference range applies only to samples taken after fasting for at least 8 hours.  Glucose, capillary     Status: Abnormal   Collection Time: 07/07/23  4:20 AM  Result Value Ref Range   Glucose-Capillary 121 (H) 70 - 99 mg/dL    Comment: Glucose reference range applies only to samples taken after fasting for at least 8 hours.  Cooxemetry Panel (carboxy, met, total hgb, O2 sat)     Status: Abnormal   Collection Time: 07/07/23  4:45 AM  Result Value Ref Range   Total hemoglobin 8.1 (L) 12.0 - 16.0 g/dL   O2 Saturation 90 %   Carboxyhemoglobin 3.1 (H) 0.5 - 1.5 %   Methemoglobin 1.5 0.0 - 1.5 %    Comment: Performed at [facility], [address] [place], [place] [address]  Comprehensive metabolic panel     Status: Abnormal   Collection Time: 07/07/23  4:50 AM  Result Value Ref Range   Sodium 142 135 - 145 mmol/L   Potassium 4.2 3.5 - 5.1 mmol/L   Chloride 112 (H) 98 - 111 mmol/L   CO2 22 22 - 32 mmol/L   Glucose, Bld 126 (H) 70 - 99 mg/dL    Comment: Glucose reference range applies only to samples taken after fasting for at least 8 hours.   BUN 58 (H) 8 - 23 mg/dL   Creatinine, Ser 1.30 (H) 0.61 - 1.24  mg/dL   Calcium 8.5 (L) 8.9 - 10.3 mg/dL   Total Protein 4.7 (L) 6.5 - 8.1 g/dL   Albumin 1.8 (L) 3.5 - 5.0 g/dL   AST 27 15 - 41 U/L   ALT 68 (H) 0 - 44 U/L   Alkaline Phosphatase 86 38 - 126 U/L   Total Bilirubin 0.6 0.0 - 1.2 mg/dL   GFR, Estimated 35 (L) >60 mL/min    Comment: (NOTE) Calculated using the CKD-EPI Creatinine Equation (2021)    Anion gap 8 5 - 15     Comment: Performed at [facility], [address], [place], [place] [address]  CBC     Status: Abnormal   Collection Time: 07/07/23  4:50 AM  Result Value Ref Range   WBC 16.3 (H) 4.0 - 10.5 K/uL   RBC 3.20 (L) 4.22 - 5.81 MIL/uL   Hemoglobin 7.7 (L) 13.0 - 17.0 g/dL   HCT 60.4 (L) 54.0 - 98.1 %   MCV 80.6 80.0 - 100.0 fL   MCH 24.1 (L) 26.0 - 34.0 pg   MCHC 29.8 (L) 30.0 - 36.0 g/dL   RDW 19.1 (H) 47.8 - 29.5 %   Platelets 341 150 - 400 K/uL   nRBC 0.2 0.0 - 0.2 %    Comment: Performed at [facility], [address], [place], [place] [address]  Glucose, capillary     Status: Abnormal   Collection Time: 07/07/23  7:42 AM  Result Value Ref Range   Glucose-Capillary 137 (H) 70 - 99 mg/dL    Comment: Glucose reference range applies only to samples taken after fasting for at least 8 hours.    Studies/Results: DG CHEST PORT 1 VIEW Result Date: 07/06/2023 CLINICAL DATA:  Chest tube placement EXAM: PORTABLE CHEST 1 VIEW COMPARISON:  07/03/2023 FINDINGS: Bibasilar chest tubes have been placed. No pneumothorax. Consolidation in the right upper lobe again noted, unchanged. Improved aeration at the left lung base. No confluent opacity on the left. Heart is borderline in size. Aortic atherosclerosis. Left central line tip in the SVC, unchanged. IMPRESSION: Interval placement of bilateral lower lobe chest tubes without pneumothorax. Improved aeration at the left base. Continued consolidation in the right upper lobe. Electronically Signed   By: Charlett Nose M.D.   On: 07/06/2023 19:29   CT Angio Abd/Pel w/ and/or w/o Result Date: 07/06/2023 CLINICAL DATA:  Lower GI bleed. EXAM: CTA ABDOMEN AND PELVIS WITHOUT AND WITH CONTRAST TECHNIQUE: Multidetector CT imaging of the abdomen and pelvis was performed using the standard protocol during bolus administration of intravenous contrast. Multiplanar reconstructed images and MIPs were obtained and reviewed to evaluate the vascular anatomy.  RADIATION DOSE REDUCTION: This exam was performed according to the departmental dose-optimization program which includes automated exposure control, adjustment of the mA and/or kV according to patient size and/or use of iterative reconstruction technique. CONTRAST:  75mL OMNIPAQUE IOHEXOL 350 MG/ML SOLN COMPARISON:  Renal ultrasound 07/04/2023. FINDINGS: Technical note: Despite efforts by the technologist and patient, mild motion artifact is present on today's exam and could not be eliminated. This reduces exam sensitivity and specificity. VASCULAR Aorta: Atherosclerosis without evidence of aneurysm or dissection. Celiac: Atherosclerosis without evidence of aneurysm or significant stenosis. SMA: Atherosclerosis without evidence of aneurysm or significant stenosis. Renals: Atherosclerosis at the origin of the right renal artery without evidence of significant stenosis. The left renal artery is markedly diminutive, likely chronic. IMA: Appears patent. Inflow: Atherosclerosis without evidence of aneurysm or large vessel occlusion. Proximal Outflow:  Atherosclerosis without evidence of aneurysm or large vessel occlusion. Veins: No significant venous abnormalities. IVC filter in place. The portal, superior mesenteric and splenic veins are patent. Review of the MIP images confirms the above findings. NON-VASCULAR Lower chest: Moderate-sized bilateral pleural effusions, larger on the right. Associated compressive atelectasis at both lung bases. There is additional patchy ground-glass opacity within the aerated portions of both lungs. Hepatobiliary: The liver appears unremarkable, without suspicious focal lesion or abnormal enhancement. High density material within the gallbladder lumen may reflect sludge or vicarious excretion of contrast. No apparent gallstones, gallbladder wall thickening or significant biliary dilatation. Pancreas: Unremarkable. No evidence of pancreatic mass, ductal dilatation or surrounding  inflammation. Spleen: Normal in size without focal abnormality. Adrenals/Urinary Tract: Both adrenal glands appear unremarkable. Marked left renal cortical thinning without significant contrast enhancement or excretion. No significant abnormality of the right kidney demonstrated. The urinary bladder is decompressed by a Foley catheter. Possible bladder wall thickening. Stomach/Bowel: Pre contrast images demonstrate high density stool within the colon, especially proximally. Post-contrast, no definite active GI bleeding identified. Possible mild sigmoid colon and rectal wall thickening. Lymphatic: No enlarged abdominopelvic lymph nodes identified. Reproductive: The prostate gland and seminal vesicles appear unremarkable. Other: Generalized subcutaneous and mesenteric edema. There is asymmetric enlargement of the right iliacus muscle with heterogeneous low-density components. No high density component, active bleeding or abnormal enhancement identified. Trace ascites. No pneumoperitoneum or focal fluid collection identified. Musculoskeletal: Nondisplaced fracture of the right 7th rib anteriorly (image 14/9). No other acute osseous findings. Multilevel spondylosis. The sacroiliac joints appear unremarkable. IMPRESSION: 1. No evidence of active GI bleeding. Possible mild sigmoid colon and rectal wall thickening which may reflect proctocolitis. 2. Asymmetric enlargement of the right iliacus muscle with heterogeneous low-density components, likely subacute hematoma or myositis with bursitis. Correlate clinically. No active bleeding or abnormal enhancement identified. 3. Nondisplaced fracture of the right 7th rib anteriorly. 4. Moderate-sized bilateral pleural effusions with associated compressive atelectasis at both lung bases. Additional patchy ground-glass opacity within the aerated portions of both lungs, possibly edema or infection. Generalized soft tissue edema, implying anasarca. 5. Markedly diminutive left renal  artery with associated marked left renal cortical thinning, likely chronic. 6. Possible bladder wall thickening, decompressed by a Foley catheter. Correlate clinically for cystitis. 7.  Aortic Atherosclerosis (ICD10-I70.0). Electronically Signed   By: Carey Bullocks M.D.   On: 07/06/2023 14:48   CARDIAC CATHETERIZATION Result Date: 07/05/2023   1st Mrg lesion is 70% stenosed.   Prox LAD to Mid LAD lesion is 50% stenosed.   Dist LAD lesion is 60% stenosed.   Prox RCA lesion is 100% stenosed. Findings: Ao = 125/65 (91) LV = 140/10 RA = 9 RV = 24/16 PA = 40/31 (30) PCW = 14 Fick cardiac output/index = 7.4/3.5 Thermo CO/CI = 7.3/3.5 PVR = 2.2 WU Ao sat = 96% PA sat = 65% PAPi = 1.0 Assessment: 1. 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 Mild PAH with severe RV failure though cardiac output is preserved Plan/Discussion: Medical therapy Arvilla Meres, MD 5:06 PM   Medications: I have reviewed the patient's current medications.  Assessment: Intermittent bright red blood and maroon bowel movements Hemoglobin was 8.3 yesterday and 7.7 today Mildly worsening renal function, BUN 58, creatinine 2.02, GFR 35  EGD had shown friable duodenal mucosa treated with hemostatic spray, continued on IV Protonix twice a day  CT angio did not show evidence of active bleeding Possible mild sigmoid  colon and rectal wall thickening which may reflect proctocolitis Asymmetric enlargement of right iliac [place] muscle likely subacute hematoma or myositis with bursitis  Bilateral complicated pleural effusion, status post bilateral chest tube placement yesterday  Cardiac arrest, status post CPR more than 20 minutes, shocked for V-fib, 4 to 5 minutes of PEA and arrest in ER, cardiogenic shock, currently on amiodarone Acute heart failure Venous thromboembolism, bilateral DVT, mixed acute and chronic, status post IVC filter placement 07/02/2023 CAD, NSTEMI, history of CAD,  carotid stenosis, CVA, atrial fibrillation Acute on chronic kidney disease Status post ceftriaxone and doxycycline for suspected pneumonia   Plan: CT angio did not show evidence of active bleeding and no diverticulosis was noted.  Patient noted to have mild thickening of sigmoid and rectum, possible proctocolitis, he does not have diarrhea, elevated leukocytosis is probably reactive, there is no fever, I would not start on antibiotics right now.  Since his stools have been liquid and brown x 3 yesterday without further bleeding, given his overall scenario I would recommend restarting heparin, closely monitoring H&H and transfusing if needed.  Currently remains hemodynamically stable and has not needed blood transfusion since 06/30/2023.   Kerin Salen, MD 07/07/2023, 9:09 AM

## 2023-07-07 NOTE — Plan of Care (Signed)
  Problem: Nutritional: Goal: Maintenance of adequate nutrition will improve Outcome: Progressing   Problem: Skin Integrity: Goal: Risk for impaired skin integrity will decrease Outcome: Progressing   Problem: Elimination: Goal: Will not experience complications related to bowel motility Outcome: Progressing

## 2023-07-07 NOTE — Progress Notes (Signed)
 Progress Note   Patient: Billy [name] GUR:[id number] DOB: 12-11-1950 DOA: 06/26/2023     11 DOS: the patient was seen and examined on 07/07/2023   Brief hospital course: 73 y/o male with h/o CHF, CKD stage IV, Atrial Fibrillation, BPH, Anemia, carotid artery disease s/p endarterectomy and stenting, chornic rhinitis, HLD and RLS who went to his neighbors house after having SOB for at least 1 week and had a cardiac arrest. Neighbor stated CPR immediately and EMS continued . He got ROSC back after about 15 min. Then in the ED he arrested again for about 2-5 min and then achieved ROSC. Patient was admitted to the ICU.   Patient found to have cardiogenic shock and required vasopressors.  Echocardiogram with mild reduction in LV systolic function. Positive bilateral deep vein thrombosis.    03/13 direct current cardioversion for atrial fibrillation. Patient noted to have hematochezia with acute blood loss anemia.   03/15 EGD duodenitis.  03/16 IVC filter placement.  03/17 resume IV heparin for anticoagulation. 03/18 completed antibiotic therapy for pneumonia.  03/20 recurrent lower gastrointestinal bleeding while on IV heparin. CT abdomen and pelvis with no active signs of bleeding. Positive bilateral pleural effusions, placed bilateral chest tubes.  03/21 no recurrent bleeding, hgb has been stable, 8,3 to 7.7, resumed heparin drip for anticoagulation.   Assessment and Plan: Acute on chronic diastolic CHF (congestive heart failure) (HCC) Echocardiogram with preserved LV systolic function with EF 55%, severe LVH, interventricular septum is flattened in systole and diastole, RV systolic function with severe reduction, RA size with mild enlargement, small pericardial effusion.   03/19 cardiac catheterization  3 vessel coronary artery disease with CTO RCA with L > R collaterals and moderate non obstructive coronary artery disease in the left system.  RA 9  RV 24/16 PA 40/31 mean 30  PCWP 14   Cardiac output 7,4 and index 3,5 Fick, 7.3 and 3.5 thermodilution.  PVR 2.2 WU   Acute on chronic core pulmonale Pulmonary hypertension  RV failure   Urine output is 1,905 ml. Systolic blood pressure 130 mmHg range.   Plan to continue hydralazine and spironolactone.  Loop diuretic with torsemide 40 mg daily.   Recovered cardiac arrest, cardiogenic shock. (Resolved) Plan for further work up with cardiac MRI.   Acute hypoxemic respiratory failure due to cardiogenic pulmonary edema.  Oxygenation has improved, oxygenation is 97% on 2 L/min per Donaldson.   Bilateral pleural effusions, sp bilateral chest tubes placement.  Pleural fluid consistent with exudate per Light's criteria, high LDH and high protein ration. Chest tubes connected to suction.  PAF (paroxysmal atrial fibrillation) (HCC) Failed direct current cardioversion, patient is back in atrial fibrillation rhythm.  Plan to continue amiodarone.  Resumed heparin today, and continue telemetry monitoring.   CKD stage 3b, GFR 30-44 ml/min (HCC) AKI, hypernatremia.   Renal function with serum cr at 2,0 with K at 4,2 and serum bicarbonate at 22.  Na 142   Plan to continue close monitoring renal function and electrolytes.  Resumed with torsemide and continue spironolactone.   Hypertension Continue blood pressure control with hydralazine.   Gastrointestinal hemorrhage with melena Endoscopy with erosive gastropathy with no bleeding and no stigmata of recent bleeding.  Bleeding friable duodenal mucosa. Hemostatic spry applied.  Patient will need colonic evaluation, currently not candidate for colonoscopy, but will benefit from CT scan.  If further drop in hgb will obtain scan as inpatient.   Acute blood loss anemia due to upper and possible lower GI  bleeding.   Patient continue to have hematochezia while on anticoagulation.  Follow up Hgb 7.7 No further evidence of active bleeding, clinically and per CT.   Plan to continue  pantoprazole Risk vs benefit will resume anticoagulation with IV heparin  Close follow up on H&H    Hyperlipidemia Continue statin therapy.   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 Acute deep vein thrombosis involving the left proximal profunda vein and left proximal femoral vein.  Indeterminate deep vein thrombosis involving the left popliteal vein, and left middle and distal femoral vein.   SP IV filter.  Resume anticoagulation with IV heparin.         Subjective: Patient is very weak and deconditioned, he had bowel movements not melanotic or hematochezia.   Physical Exam: Vitals:   07/07/23 0500 07/07/23 0600 07/07/23 0628 07/07/23 0700  BP: (!) 127/47 (!) 141/52 (!) 141/52 132/67  Pulse: 82 68  70  Resp: 12 17  (!) 22  Temp:    97.8 F (36.6 C)  TempSrc:    Axillary  SpO2: 96% 96%  97%  Weight: 83.3 kg     Height:       Neurology awake and alert ENT with mild pallor, no icterus Cardiovascular with S1 and S2 present and regular with no gallops, rubs or murmurs Respiratory with mild rales at bases with no wheezing or rhonchi Abdomen with no distention and non tender  Trace lower extremity edema.,  Data Reviewed:    Family Communication: no family at the bedside   Disposition: Status is: Inpatient Remains inpatient appropriate because: recovering from cardiac arrest.   Planned Discharge Destination:  to be determined      Author: Coralie Keens, MD 07/07/2023 9:41 AM  For on call review [email or web address].

## 2023-07-07 NOTE — Progress Notes (Addendum)
 Patient ID: Niles Ess, male   DOB: 11-20-1950, 73 y.o.   MRN: [id number]     Advanced Heart Failure Rounding Note  Cardiologist: Nanetta Batty, MD   Chief Complaint: RV failure  Subjective:   EGD (3/15) showed bleeding friable duodenal mucosa treated with hemostatic spray. No overt GI bleeding today, hgb 8.9.   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 Mild PAH with severe RV failure though cardiac output is preserved. Hep gtt restarted post-cath  Recurrent GIB yesterday. CT angio of abdomen showed no active bleeding. No further gross bleeding. Hgb 7.7 today. SCr 1.7>>2.0 post contrast.   Off milrinone. Co-ox 90%. Back in NSR on amio gtt.   Afebrile, WBCs 16K and stable.  Completed ceftriaxone/doxycycline.   Working w/ PT. Just returned from a walk. Some fatigue and discomfort from CTs.   Objective:   V/Q 3/17 with multiple perfusion defects consistent with CXR findings.  Weight Range: 83.3 kg Body mass index is 24.23 kg/m.   Vital Signs: Temp:  [97.9 F (36.6 C)-98.2 F (36.8 C)] 98 F (36.7 C) (03/21 0400) Pulse Rate:  [68-99] 70 (03/21 0700) Resp:  [12-26] 22 (03/21 0700) BP: (73-153)/(29-67) 132/67 (03/21 0700) SpO2:  [93 %-97 %] 97 % (03/21 0700) Weight:  [83.3 kg] 83.3 kg (03/21 0500) Last BM Date : 07/05/23  Weight change: Filed Weights   07/05/23 0900 07/06/23 0500 07/07/23 0500  Weight: 84.4 kg 83.3 kg 83.3 kg   Intake/Output:  Intake/Output Summary (Last 24 hours) at 07/07/2023 0904 Last data filed at 07/07/2023 0600 Gross per 24 hour  Intake 622.56 ml  Output 2865 ml  Net -2242.44 ml    Physical Exam   General:  fatigued appearing. No respiratory difficulty HEENT: normal Neck: supple. JVD not elevated Carotids 2+ bilat; no bruits. No lymphadenopathy or thyromegaly appreciated. Cor: PMI nondisplaced. Regular rate & rhythm. No rubs, gallops or murmurs.  Lungs: decreased BS at the bases +  CTs Abdomen: soft, nontender, nondistended. No hepatosplenomegaly. No bruits or masses. Good bowel sounds. Extremities: no cyanosis, clubbing, rash, bilateral upper ext edematous, trace b/l pretibial edema Neuro: alert & oriented x 3, cranial nerves grossly intact. moves all 4 extremities w/o difficulty. Affect pleasant. GU: + foley    Telemetry   NSR 70s, personally reviewed   EKG    N/A  Labs    CBC Recent Labs    07/06/23 0510 07/06/23 0957 07/06/23 1614 07/07/23 0450  WBC 16.0*  --   --  16.3*  HGB 7.9*   < > 8.3* 7.7*  HCT 27.0*   < > 27.6* 25.8*  MCV 81.1  --   --  80.6  PLT 325  --   --  341   < > = values in this interval not displayed.   Basic Metabolic Panel Recent Labs    40/98/11 2100 07/05/23 0433 07/05/23 1625 07/06/23 0510 07/07/23 0450  NA 144 142   < > 141 142  K 4.0 4.1   < > 4.2 4.2  CL 115* 112*  --  113* 112*  CO2 22 22  --  22 22  GLUCOSE 147* 123*  --  112* 126*  BUN 47* 47*  --  47* 58*  CREATININE 1.76* 1.75*  --  1.71* 2.02*  CALCIUM 8.4* 8.5*  --  8.4* 8.5*  MG 2.2 2.2  --   --   --    < > = values  in this interval not displayed.   Liver Function Tests Recent Labs    07/06/23 0510 07/07/23 0450  AST 24 27  ALT 83* 68*  ALKPHOS 79 86  BILITOT 0.7 0.6  PROT 4.7* 4.7*  ALBUMIN 1.8* 1.8*    No results for input(s): "LIPASE", "AMYLASE" in the last 72 hours. Cardiac Enzymes No results for input(s): "CKTOTAL", "CKMB", "CKMBINDEX", "TROPONINI" in the last 72 hours.  BNP: BNP (last 3 results) Recent Labs    03/09/23 1544 03/11/23 0218 06/27/23 1116  BNP 660.9* 491.3* 646.4*    Imaging   DG CHEST PORT 1 VIEW Result Date: 07/06/2023 CLINICAL DATA:  Chest tube placement EXAM: PORTABLE CHEST 1 VIEW COMPARISON:  07/03/2023 FINDINGS: Bibasilar chest tubes have been placed. No pneumothorax. Consolidation in the right upper lobe again noted, unchanged. Improved aeration at the left lung base. No confluent opacity on the left.  Heart is borderline in size. Aortic atherosclerosis. Left central line tip in the SVC, unchanged. IMPRESSION: Interval placement of bilateral lower lobe chest tubes without pneumothorax. Improved aeration at the left base. Continued consolidation in the right upper lobe. Electronically Signed   By: Charlett Nose M.D.   On: 07/06/2023 19:29   CT Angio Abd/Pel w/ and/or w/o Result Date: 07/06/2023 CLINICAL DATA:  Lower GI bleed. EXAM: CTA ABDOMEN AND PELVIS WITHOUT AND WITH CONTRAST TECHNIQUE: Multidetector CT imaging of the abdomen and pelvis was performed using the standard protocol during bolus administration of intravenous contrast. Multiplanar reconstructed images and MIPs were obtained and reviewed to evaluate the vascular anatomy. RADIATION DOSE REDUCTION: This exam was performed according to the departmental dose-optimization program which includes automated exposure control, adjustment of the mA and/or kV according to patient size and/or use of iterative reconstruction technique. CONTRAST:  75mL OMNIPAQUE IOHEXOL 350 MG/ML SOLN COMPARISON:  Renal ultrasound 07/04/2023. FINDINGS: Technical note: Despite efforts by the technologist and patient, mild motion artifact is present on today's exam and could not be eliminated. This reduces exam sensitivity and specificity. VASCULAR Aorta: Atherosclerosis without evidence of aneurysm or dissection. Celiac: Atherosclerosis without evidence of aneurysm or significant stenosis. SMA: Atherosclerosis without evidence of aneurysm or significant stenosis. Renals: Atherosclerosis at the origin of the right renal artery without evidence of significant stenosis. The left renal artery is markedly diminutive, likely chronic. IMA: Appears patent. Inflow: Atherosclerosis without evidence of aneurysm or large vessel occlusion. Proximal Outflow: Atherosclerosis without evidence of aneurysm or large vessel occlusion. Veins: No significant venous abnormalities. IVC filter in place.  The portal, superior mesenteric and splenic veins are patent. Review of the MIP images confirms the above findings. NON-VASCULAR Lower chest: Moderate-sized bilateral pleural effusions, larger on the right. Associated compressive atelectasis at both lung bases. There is additional patchy ground-glass opacity within the aerated portions of both lungs. Hepatobiliary: The liver appears unremarkable, without suspicious focal lesion or abnormal enhancement. High density material within the gallbladder lumen may reflect sludge or vicarious excretion of contrast. No apparent gallstones, gallbladder wall thickening or significant biliary dilatation. Pancreas: Unremarkable. No evidence of pancreatic mass, ductal dilatation or surrounding inflammation. Spleen: Normal in size without focal abnormality. Adrenals/Urinary Tract: Both adrenal glands appear unremarkable. Marked left renal cortical thinning without significant contrast enhancement or excretion. No significant abnormality of the right kidney demonstrated. The urinary bladder is decompressed by a Foley catheter. Possible bladder wall thickening. Stomach/Bowel: Pre contrast images demonstrate high density stool within the colon, especially proximally. Post-contrast, no definite active GI bleeding identified. Possible mild sigmoid colon and rectal wall  thickening. Lymphatic: No enlarged abdominopelvic lymph nodes identified. Reproductive: The prostate gland and seminal vesicles appear unremarkable. Other: Generalized subcutaneous and mesenteric edema. There is asymmetric enlargement of the right iliacus muscle with heterogeneous low-density components. No high density component, active bleeding or abnormal enhancement identified. Trace ascites. No pneumoperitoneum or focal fluid collection identified. Musculoskeletal: Nondisplaced fracture of the right 7th rib anteriorly (image 14/9). No other acute osseous findings. Multilevel spondylosis. The sacroiliac joints appear  unremarkable. IMPRESSION: 1. No evidence of active GI bleeding. Possible mild sigmoid colon and rectal wall thickening which may reflect proctocolitis. 2. Asymmetric enlargement of the right iliacus muscle with heterogeneous low-density components, likely subacute hematoma or myositis with bursitis. Correlate clinically. No active bleeding or abnormal enhancement identified. 3. Nondisplaced fracture of the right 7th rib anteriorly. 4. Moderate-sized bilateral pleural effusions with associated compressive atelectasis at both lung bases. Additional patchy ground-glass opacity within the aerated portions of both lungs, possibly edema or infection. Generalized soft tissue edema, implying anasarca. 5. Markedly diminutive left renal artery with associated marked left renal cortical thinning, likely chronic. 6. Possible bladder wall thickening, decompressed by a Foley catheter. Correlate clinically for cystitis. 7.  Aortic Atherosclerosis (ICD10-I70.0). Electronically Signed   By: Carey Bullocks M.D.   On: 07/06/2023 14:48   Medications:   Scheduled Medications:  atorvastatin  80 mg Oral Daily   Chlorhexidine Gluconate Cloth  6 each Topical Daily   ezetimibe  10 mg Oral Daily   feeding supplement  237 mL Oral TID WC & HS   hydrALAZINE  100 mg Oral Q8H   insulin aspart  0-9 Units Subcutaneous Q4H   lidocaine  1 patch Transdermal Q24H   oxyCODONE  5 mg Oral Q6H   pantoprazole (PROTONIX) IV  40 mg Intravenous Q6H   Followed by   Melene Muller ON 07/09/2023] pantoprazole (PROTONIX) IV  40 mg Intravenous Q12H   sertraline  50 mg Oral QHS   sodium chloride flush  10-40 mL Intracatheter Q12H   sodium chloride flush  3 mL Intravenous Q12H   spironolactone  25 mg Oral Daily   torsemide  40 mg Oral Daily   Infusions:  amiodarone 30 mg/hr (07/06/23 2219)    PRN Medications: acetaminophen, docusate sodium, feeding supplement, HYDROmorphone (DILAUDID) injection, influenza vaccine adjuvanted, ondansetron (ZOFRAN) IV,  mouth rinse, pneumococcal 20-valent conjugate vaccine, polyethylene glycol, sodium chloride flush, sodium chloride flush  Patient Profile   Rembert Browe is a 73 yo male with PMH of HTN, HLD, prior DVT, CVA, paroxysmal atrial fibrillation, CKD stage IIIb, carotid artery disease s/p endarterectomy, and chronic diastolic CHF. Admitted following out of hospital witnessed cardiac arrest.   Assessment/Plan  1. Cardiac arrest: Witnessed arrest with bystander then EMS CPR.  >20 minutes CPR.  He was shocked for VF.  Had additional 4-5 minutes PEA arrest in ER. ?PE with aspiration given bilateral DVTs.  - He is on amiodarone gtt  2. Cardiogenic shock: Initial echo: EF 45%, diffuse HK, severe LVH, normal RV size with mod decreased systolic function, small pericardial effusion, dilated IVC.  Suspicion for PE as above.  PNA on CXR, initial hypotension thought to be due to septic shock.  He is now off pressors.   3. Acute HF with mid range EF: With prominent RV failure. Initial echo: EF 45%, diffuse HK, severe LVH, normal RV size with mod decreased systolic function, small pericardial effusion, dilated IVC. As above, worry for PE with RV failure. Baseline cardiomyopathy with severe LVH raises concern for cardiac amyloidosis.  TEE 03/13: EF 55%, severe LVH with mild RV dysfunction, small IVC.  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 Mild PAH with severe RV failure though cardiac output is preserved.   - co-ox remains stable off milrinone  - mild volume overload on exam. Continue torsemide 40 mg daily. Monitor SCr post contrast  - Continue TED hose. - Continue hydralazine 100 tid for HTN.  - Continue spiro 25 mg daily - He should have workup for cardiac amyloidosis, Unfortunately unable to get cMRI due to metal plate in leg  4. Venous thromboembolism: Bilateral DVTs, mixed acute and chronic.  He is not anticoagulated at home.  With RV failure on  echo, strong suspicion for PE.  - Unfortunately off AC with GI bleeding.  Now s/p infrarenal IVC placement 3/16.   - V/Q 3/17 with multiple perfusion defects consistent with CXR findings. - No CTA with AKI.  - BUE [place] with SVTs and DVT.  - restart low dose heparin today   5. CAD: NSTEMI with HS-TnI 3045.  ?Demand ischemia from arrest vs ACS.  He has history of carotid disease. - Plavix stopped with GI bleeding.  - Atorvastatin 80 daily.  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 Mild PAH with severe RV failure though cardiac output is preserved.    6. Carotid stenosis: h/o CVA.  TCAR in 6/24.  OK to stop Plavix at this point (now off).   7. Atrial fibrillation: He was not on AC at home though has history of prior CVA.  AC stopped due to prior GI bleeds. He went into AF/RVR here, tolerated poorly.  TEE/DCCV on 03/13.   - May need Watchman more long-term. Now with IVC filter. Failed heparin challenge.  - NSR today  - Continue amiodarone gtt 30 mg/hr  - Daily BMET  8. AKI on CKD 3b: Suspect ATN in setting of hypotension/arrest.  Baseline Scr seems to be around 2.1. Creatinine 2.55 > 2.28 > 2.05>1.53>1.77>1.75>1.71>>2.0 today (post contrast from CT scan) - follow BMP closely   9. ID: PCT [id number], suspect PNA.   - He has completed course of ceftriaxone/doxycycline.   10. Neuro: Somewhat slowed post arrest.  11. GI bleeding: Hematochezia with AC.  Had this in the past with [facility] as well.  GI following.  Has had transfusion here. EGD showed bleeding friable duodenal mucosa treated with hemostatic spray.  Had recurrent bleeding 3/20. Heparin held. CT angio of abdomen showed no active bleeding. No further gross bleeding. Hgb stable 7.7 - per GI, ok to restart low dose heparin  - Continue PPI - monitor H/H   12. Pleural Effusions - s/p CT placement per CCM 3/20  - cultures pending    Brittainy Simmons PA-C  07/07/2023 9:04  AM  Agree with above  Underwent bilateral CTs yesterday. Still draining. Sore at both sites.   Back in NSR today on IV amio.   Off heparin. No further bleeding. CT angio A/P no source of bleeding  Feels weak   General:  Weak appearing. No resp difficulty HEENT: normal Neck: supple. no JVD. Carotids 2+ bilat; no bruits. No lymphadenopathy or thryomegaly appreciated. Cor: Regular rate & rhythm. No rubs, gallops or murmurs. Lungs: dull at bases + CTs Abdomen: soft, nontender, nondistended. No hepatosplenomegaly. No bruits or masses. Good bowel sounds. Extremities: no cyanosis, clubbing, rash, 2+ edema in forearms + TEDs Neuro:  alert & orientedx3, cranial nerves grossly intact. moves all 4 extremities w/o difficulty. Affect pleasant  Remains very tenuous. With acute DVT/PE and MSOF.   Resp status somewhat improved with CTs. (Fluid is transudative)   Continue gentle diuresis.   Restart low-dose heparin given diffuse PE/DVT burden. (IVC filter in place)  Watch renal function after CT angio   Sent for cMRI yesterday to evalaute for infiltrative disease but unable to get due to metal in LEs  CRITICAL CARE Performed by: Arvilla Meres  Total critical care time: 50 minutes  Critical care time was exclusive of separately billable procedures and treating other patients.  Critical care was necessary to treat or prevent imminent or life-threatening deterioration.  Critical care was time spent personally by me (independent of midlevel providers or residents) on the following activities: development of treatment plan with patient and/or surrogate as well as nursing, discussions with consultants, evaluation of patient's response to treatment, examination of patient, obtaining history from patient or surrogate, ordering and performing treatments and interventions, ordering and review of laboratory studies, ordering and review of radiographic studies, pulse oximetry and re-evaluation of  patient's condition.  Arvilla Meres, MD  2:39 PM
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
## 2023-07-07 NOTE — Progress Notes (Signed)
 NAME:  Billy [name], MRN:  [id number], DOB:  [birth year], LOS: 11 ADMISSION DATE:  2023-07-14, CONSULTATION DATE:  Jul 14, 2023 REFERRING MD:  ED Physician, CHIEF COMPLAINT:  Cardiac Arrest   History of Present Illness:  73 y/o male with h/o CHF, CKD stage IV, Atrial Fibrillation, BPH, Anemia, carotid artery disease s/p endarterectomy and stenting, chornic rhinitis, HLD and RLS who went to his neighbors house today after having SOB for at least 1 week and had a cardiac arrest.  Neighbor stated CPR immediately and EMS continued .  He got ROSC back after about 15 min.  Then in the ED he arrested again for about 2-5 min and then achieved ROSC.  In the ED he was given 1 Epi and had chest compressions.   In the ED he was seen by Cardiology for possible STEMI, but felt as though he was not a a STEMI.  Pertinent  Medical History  Chronic kidney disease, stage 4 (severe)  Atherosclerosis of renal artery Unspecified atrial fibrillation  Personal history of other venous thrombosis and embolism Chronic systolic (congestive) heart failure  Significant Hospital Events: Including procedures, antibiotic start and stop dates in addition to other pertinent events   07-14-23: Post code in field, coded x 3 min in ED, CPR and 1 epinephrine given to achieve ROSC, intubated 3/11: following commands  3/12: extubated on [facility] 3/13:  Con't on 5LNC. He says he feels sob with laying flat.  Underwent TEE which was negative for LAA thrombus therefore patient was cardioverted to NSR complications 3/14 No acute issues overnight, remains in NSR; developed BRBPR; heparin stopped 3/15 EGD: duodenitis, ongoing BRBPR but H/H stable 3/16 for IVC filter 3/20 Bilat effusions seen on CT abd/pelvis, PCCM consult, bilat chest tubes placed   Interim History / Subjective:  He has been having some mild cough, no fevers or chills, requiring [place] oxygen  Objective   Blood pressure 132/67, pulse 70, temperature 97.8 F (36.6 C), temperature  source Axillary, resp. rate (!) 22, height 6\' 1"  (1.854 m), weight 83.3 kg, SpO2 97%. CVP:  [0 mmHg-9 mmHg] 9 mmHg      Intake/Output Summary (Last 24 hours) at 07/07/2023 0957 Last data filed at 07/07/2023 0900 Gross per 24 hour  Intake 1094.67 ml  Output 2865 ml  Net -1770.33 ml   Filed Weights   07/05/23 0900 07/06/23 0500 07/07/23 0500  Weight: 84.4 kg 83.3 kg 83.3 kg    General:  ill-appearing M sitting up in the chair in NAD HEENT: MM pink/moist Neuro: alert and oriented, moving all extremities CV: s1s2 rrr, no m/r/g PULM:  diminished bilaterally in the bases, bilat chest tubes in place GI: soft, non-tender to palpation Extremities: warm/dry, 1+ peripheral  edema  Skin: no rashes or lesions   Resolved Hospital Problem list   AGMA, resolved, resolved  Lactic acidosis, resolved   Assessment & Plan:    Bilateral pleural effusions S/p bilateral CT placement  -draining straw colored pleural fluid, 660cc out R pleural, L pleural 300 -pleural studies consistent with transudative effusion  -CT to suction, flush and tube care per protocol  -monitor output one more day and repeat CXR in the AM -continue supplemental O2 prn to maintain sats >92% -thank you for this consult, we will continue to follow along with you   Rest of plan per primary team and HF team   Darcella Gasman Shantae Vantol, PA-C  Pulmonary & Critical care See Amion for pager If no response to pager , please call [phone]  [phone] until 7pm After 7:00 pm call Elink  [phone]
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
## 2023-07-08 ENCOUNTER — Inpatient Hospital Stay (HOSPITAL_COMMUNITY)

## 2023-07-08 DIAGNOSIS — I469 Cardiac arrest, cause unspecified: Secondary | ICD-10-CM | POA: Diagnosis not present

## 2023-07-08 DIAGNOSIS — I824Y3 Acute embolism and thrombosis of unspecified deep veins of proximal lower extremity, bilateral: Secondary | ICD-10-CM

## 2023-07-08 DIAGNOSIS — I480 Paroxysmal atrial fibrillation: Secondary | ICD-10-CM | POA: Diagnosis not present

## 2023-07-08 DIAGNOSIS — I5033 Acute on chronic diastolic (congestive) heart failure: Secondary | ICD-10-CM | POA: Diagnosis not present

## 2023-07-08 DIAGNOSIS — N1832 Chronic kidney disease, stage 3b: Secondary | ICD-10-CM | POA: Diagnosis not present

## 2023-07-08 LAB — CBC
HCT: 25.4 % — ABNORMAL LOW (ref 39.0–52.0)
Hemoglobin: 7.6 g/dL — ABNORMAL LOW (ref 13.0–17.0)
MCH: 24.4 pg — ABNORMAL LOW (ref 26.0–34.0)
MCHC: 29.9 g/dL — ABNORMAL LOW (ref 30.0–36.0)
MCV: 81.4 fL (ref 80.0–100.0)
Platelets: 388 10*3/uL (ref 150–400)
RBC: 3.12 MIL/uL — ABNORMAL LOW (ref 4.22–5.81)
RDW: 19.4 % — ABNORMAL HIGH (ref 11.5–15.5)
WBC: 18.6 10*3/uL — ABNORMAL HIGH (ref 4.0–10.5)
nRBC: 0.2 % (ref 0.0–0.2)

## 2023-07-08 LAB — COMPREHENSIVE METABOLIC PANEL
ALT: 58 U/L — ABNORMAL HIGH (ref 0–44)
AST: 23 U/L (ref 15–41)
Albumin: 1.8 g/dL — ABNORMAL LOW (ref 3.5–5.0)
Alkaline Phosphatase: 97 U/L (ref 38–126)
Anion gap: 4 — ABNORMAL LOW (ref 5–15)
BUN: 64 mg/dL — ABNORMAL HIGH (ref 8–23)
CO2: 24 mmol/L (ref 22–32)
Calcium: 8.3 mg/dL — ABNORMAL LOW (ref 8.9–10.3)
Chloride: 114 mmol/L — ABNORMAL HIGH (ref 98–111)
Creatinine, Ser: 2.24 mg/dL — ABNORMAL HIGH (ref 0.61–1.24)
GFR, Estimated: 31 mL/min — ABNORMAL LOW (ref >60)
Glucose, Bld: 144 mg/dL — ABNORMAL HIGH (ref 70–99)
Potassium: 4.4 mmol/L (ref 3.5–5.1)
Sodium: 142 mmol/L (ref 135–145)
Total Bilirubin: 0.5 mg/dL (ref 0.0–1.2)
Total Protein: 5 g/dL — ABNORMAL LOW (ref 6.5–8.1)

## 2023-07-08 LAB — GLUCOSE, CAPILLARY
Glucose-Capillary: 120 mg/dL — ABNORMAL HIGH (ref 70–99)
Glucose-Capillary: 142 mg/dL — ABNORMAL HIGH (ref 70–99)
Glucose-Capillary: 134 mg/dL — ABNORMAL HIGH (ref 70–99)
Glucose-Capillary: 119 mg/dL — ABNORMAL HIGH (ref 70–99)
Glucose-Capillary: 118 mg/dL — ABNORMAL HIGH (ref 70–99)

## 2023-07-08 LAB — COOXEMETRY PANEL
Carboxyhemoglobin: 2.2 % — ABNORMAL HIGH (ref 0.5–1.5)
Methemoglobin: <0.7 % (ref 0.0–1.5)
O2 Saturation: 85.9 %
Total hemoglobin: 8.1 g/dL — ABNORMAL LOW (ref 12.0–16.0)

## 2023-07-08 LAB — HEPARIN LEVEL (UNFRACTIONATED)
Heparin Unfractionated: <0.1 [IU]/mL — ABNORMAL LOW (ref 0.30–0.70)
Heparin Unfractionated: <0.1 [IU]/mL — ABNORMAL LOW (ref 0.30–0.70)

## 2023-07-08 LAB — HEMOGLOBIN AND HEMATOCRIT, BLOOD
HCT: 26.8 % — ABNORMAL LOW (ref 39.0–52.0)
Hemoglobin: 7.9 g/dL — ABNORMAL LOW (ref 13.0–17.0)

## 2023-07-08 MED ORDER — AMIODARONE LOAD VIA INFUSION
150.0000 mg | Freq: Once | INTRAVENOUS | Status: AC
  Administered 2023-07-08: 150 mg via INTRAVENOUS

## 2023-07-08 MED ORDER — TORSEMIDE 20 MG PO TABS
40.0000 mg | ORAL_TABLET | Freq: Two times a day (BID) | ORAL | Status: DC
  Administered 2023-07-08 – 2023-07-09 (×2): 40 mg via ORAL
  Filled 2023-07-08 (×2): qty 2

## 2023-07-08 NOTE — Progress Notes (Signed)
 NAME:  Billy [name], MRN:  [id number], DOB:  [birth year], LOS: 12 ADMISSION DATE:  09-Jul-2023, CONSULTATION DATE:  09-Jul-2023 REFERRING MD:  ED Physician, CHIEF COMPLAINT:  Cardiac Arrest   History of Present Illness:  73 y/o male with h/o CHF, CKD stage IV, Atrial Fibrillation, BPH, Anemia, carotid artery disease s/p endarterectomy and stenting, chornic rhinitis, HLD and RLS who went to his neighbors house today after having SOB for at least 1 week and had a cardiac arrest.  Neighbor stated CPR immediately and EMS continued .  He got ROSC back after about 15 min.  Then in the ED he arrested again for about 2-5 min and then achieved ROSC.  In the ED he was given 1 Epi and had chest compressions.   In the ED he was seen by Cardiology for possible STEMI, but felt as though he was not a a STEMI.  Pertinent  Medical History  Chronic kidney disease, stage 4 (severe)  Atherosclerosis of renal artery Unspecified atrial fibrillation  Personal history of other venous thrombosis and embolism Chronic systolic (congestive) heart failure  Significant Hospital Events: Including procedures, antibiotic start and stop dates in addition to other pertinent events   2023-07-09: Post code in field, coded x 3 min in ED, CPR and 1 epinephrine given to achieve ROSC, intubated 3/11: following commands  3/12: extubated on [facility] 3/13:  Con't on 5LNC. He says he feels sob with laying flat.  Underwent TEE which was negative for LAA thrombus therefore patient was cardioverted to NSR complications 3/14 No acute issues overnight, remains in NSR; developed BRBPR; heparin stopped 3/15 EGD: duodenitis, ongoing BRBPR but H/H stable 3/16 for IVC filter 3/20 Bilat effusions seen on CT abd/pelvis, PCCM consult, bilat chest tubes placed   Interim History / Subjective:  Denies any complaint, stated breathing has much improved Now on room air  Objective   Blood pressure (!) 116/53, pulse 77, temperature 98 F (36.7 C), temperature  source Axillary, resp. rate 13, height 6\' 1"  (1.854 m), weight 85.5 kg, SpO2 96%. CVP:  [10 mmHg-15 mmHg] 15 mmHg      Intake/Output Summary (Last 24 hours) at 07/08/2023 4098 Last data filed at 07/08/2023 1191 Gross per 24 hour  Intake 1095.63 ml  Output 2105 ml  Net -1009.37 ml   Filed Weights   07/06/23 0500 07/07/23 0500 07/08/23 0500  Weight: 83.3 kg 83.3 kg 85.5 kg    Physical exam: General: Chronically ill-appearing elderly male, lying on the bed HEENT: [place]/AT, eyes anicteric.  moist mucus membranes Neuro: Alert, awake following commands, moving all 4 extremities Chest: Coarse breath sounds, no wheezes or rhonchi.  Bilateral chest tube in place with straw-colored fluid draining Heart: Regular rate and rhythm, no murmurs or gallops Abdomen: Soft, nontender, nondistended, bowel sounds present Skin: No rash  Left-sided chest tube output 200+ in last 24 hours Right-sided chest tube output 300+ in last 24 hours  Assessment & Plan:  Bilateral pleural effusions with septations status post bilateral chest tube placement Pleural fluid studies are consistent with transudative effusions even though septations were visible on chest ultrasonography Patient is off antibiotics, remained afebrile His white count went up likely due to aggressive diuresis Total chest tube output was 530 cc in last 24 hours If output decreases by tomorrow, will consider discontinuing chest tubes  Rest of the management per primary team and advanced heart failure    Cheri Fowler, MD [place] [facility] See Amion for pager If no response to pager,  please call [phone] until 7pm After 7pm, Please call E-link [phone]

## 2023-07-08 NOTE — Progress Notes (Signed)
 Progress Note   Patient: Billy [name] FAO:[id number] DOB: [birth year] DOA: 06/26/2023     12 DOS: the patient was seen and examined on 07/08/2023   Brief hospital course: 73 y/o male with h/o CHF, CKD stage IV, Atrial Fibrillation, BPH, Anemia, carotid artery disease s/p endarterectomy and stenting, chornic rhinitis, HLD and RLS who went to his neighbors house after having SOB for at least 1 week and had a cardiac arrest. Neighbor stated CPR immediately and EMS continued . He got ROSC back after about 15 min. Then in the ED he arrested again for about 2-5 min and then achieved ROSC. Patient was admitted to the ICU.   Patient found to have cardiogenic shock and required vasopressors.  Echocardiogram with mild reduction in LV systolic function. Positive bilateral deep vein thrombosis.    03/13 direct current cardioversion for atrial fibrillation. Patient noted to have hematochezia with acute blood loss anemia.   03/15 EGD duodenitis.  03/16 IVC filter placement.  03/17 resume IV heparin for anticoagulation. 03/18 completed antibiotic therapy for pneumonia.  03/20 recurrent lower gastrointestinal bleeding while on IV heparin. CT abdomen and pelvis with no active signs of bleeding. Positive bilateral pleural effusions, placed bilateral chest tubes.  03/21 no recurrent bleeding, hgb has been stable, 8,3 to 7.7, resumed heparin drip for anticoagulation.  03/22 bowel movement with dark brown red color, no low dose heparin.   Assessment and Plan: Acute on chronic diastolic CHF (congestive heart failure) (HCC) Echocardiogram with preserved LV systolic function with EF 55%, severe LVH, interventricular septum is flattened in systole and diastole, RV systolic function with severe reduction, RA size with mild enlargement, small pericardial effusion.   03/19 cardiac catheterization  3 vessel coronary artery disease with CTO RCA with L > R collaterals and moderate non obstructive coronary artery disease in  the left system.  RA 9  RV 24/16 PA 40/31 mean 30  PCWP 14  Cardiac output 7,4 and index 3,5 Fick, 7.3 and 3.5 thermodilution.  PVR 2.2 WU   Acute on chronic core pulmonale Pulmonary hypertension  RV failure   Urine output is 1,675 ml. Systolic blood pressure 140 mmHg range.   Plan to continue hydralazine and spironolactone.  Loop diuretic with torsemide 40 mg daily.   Recovered cardiac arrest, cardiogenic shock. (Resolved) Plan for further work up with cardiac MRI.   Acute hypoxemic respiratory failure due to cardiogenic pulmonary edema.  Oxygenation has improved, oxygenation is 97% on 1 L/min per [place].   Bilateral pleural effusions, sp bilateral chest tubes placement.  Pleural fluid consistent with exudate per Light's criteria, high LDH and high protein ration. Chest tubes connected to suction.  PAF (paroxysmal atrial fibrillation) (HCC) Failed direct current cardioversion, patient is back in atrial fibrillation rhythm.  Patient converted back to sinus rhythm, required bolus amiodarone today.  Plan to continue amiodarone and low dose heparin.  CKD stage 3b, GFR 30-44 ml/min (HCC) AKI, hypernatremia.   Renal function with serum cr at 2,24 with K at 4,4 and serum bicarbonate at 24 Na 142   Plan to continue close monitoring renal function and electrolytes.  Resumed with torsemide and continue spironolactone.   Hypertension Continue blood pressure control with hydralazine.   Gastrointestinal hemorrhage with melena Endoscopy with erosive gastropathy with no bleeding and no stigmata of recent bleeding.  Bleeding friable duodenal mucosa. Hemostatic spry applied.  Patient will need colonic evaluation, currently not candidate for colonoscopy, but will benefit from CT scan.  If further drop in hgb will  obtain scan as inpatient.   Acute blood loss anemia due to upper and possible lower GI  bleeding.   Patient continue to have hematochezia while on anticoagulation.  Follow  up Hgb 7.7 No further evidence of active bleeding, clinically and per CT.   Plan to continue pantoprazole Low dose heparin infusion.  Hgb this am 7,6  He had brown red bowel movement, will check H&H this pm.   Hyperlipidemia Continue statin therapy.   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 Acute deep vein thrombosis involving the left proximal profunda vein and left proximal femoral vein.  Indeterminate deep vein thrombosis involving the left popliteal vein, and left middle and distal femoral vein.   SP IV filter.  Anticoagulation with IV heparin.        Subjective: Patient very weak and deconditioned, he is seating in the chair at the side of the bed. No chest pain, and no dyspnea.   Physical Exam: Vitals:   07/08/23 1200 07/08/23 1300 07/08/23 1400 07/08/23 1405  BP: (!) 141/54 (!) 142/71 (!) 134/46 (!) 134/46  Pulse: 90 (!) 105 81   Resp: 18 (!) 22 19   Temp:      TempSrc:      SpO2: 96% 97% 97%   Weight:      Height:       Neurology somnolent but easy to arouse, deconditioned and ill looking appearing ENT with mild pallor with no icterus Cardiovascular with S1 and S2 present and regular, with no gallops or rubs, positive systolic murmur at the apex No JVD Positive lower extremity edema pitting +  Respiratory with rales at bases with no wheezing or rhonchi Abdomen with no distention  Chest tubes in place  Data Reviewed:    Family Communication: no family at the bedside   Disposition: Status is: Inpatient Remains inpatient appropriate because: critically ill   Planned Discharge Destination:  to be determined      Author: Coralie Keens, MD 07/08/2023 2:49 PM  For on call review [email or web address].

## 2023-07-08 NOTE — Progress Notes (Signed)
 Patient ID: Billy [name], male   DOB: [birth year], 73 y.o.   MRN: [id number]     Advanced Heart Failure Rounding Note  Cardiologist: Nanetta Batty, MD   Chief Complaint: RV failure  Subjective:     Events -  3/10 admitted with OOH cardiac arrest. 20 min CPR. Shocked for VF - In/out AF -> IV amio - Echo EF 45% with RV dysfunction -> suspect PE. No CT with AKI - U/s upper and lower extremities with DVTs - V/Q 3/17 with multiple perfusion defects consistent with CXR findings. ->  IVC filter placed due to GIB - EGD (3/15) showed bleeding friable duodenal mucosa treated with hemostatic spray. No overt GI bleeding today, hgb 8.9.  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 Mild PAH with severe RV failure though cardiac output is preserved. Hep gtt restarted post-cath - Recurrent GIB 3/20. CT angio of abdomen showed no active bleeding. + large bilateral effusion - Bilateral CTs placed 3/20 (transudative)   Feels weak. Breathing some better. Back on IV heparin. No further GI bleeding.   CTs still draining. CVP 8-9. Co-ox 86%   Scr rising after CT 1.7 -> 20 -> 22  In/out AF on IV amio   Objective:     Weight Range: 85.5 kg Body mass index is 24.87 kg/m.   Vital Signs: Temp:  [97.8 F (36.6 C)-98.2 F (36.8 C)] 97.8 F (36.6 C) (03/22 1159) Pulse Rate:  [66-98] 77 (03/22 1100) Resp:  [12-24] 18 (03/22 1100) BP: (110-166)/(39-93) 142/46 (03/22 1100) SpO2:  [95 %-99 %] 97 % (03/22 1100) Weight:  [85.5 kg] 85.5 kg (03/22 0500) Last BM Date : 07/05/23  Weight change: Filed Weights   07/06/23 0500 07/07/23 0500 07/08/23 0500  Weight: 83.3 kg 83.3 kg 85.5 kg   Intake/Output:  Intake/Output Summary (Last 24 hours) at 07/08/2023 1239 Last data filed at 07/08/2023 1100 Gross per 24 hour  Intake 1338.06 ml  Output 2225 ml  Net -886.94 ml    Physical Exam   General:  Sitting up in bed. Weak appearing.  HEENT:  normal Neck: supple. JVP 8. Carotids 2+ bilat; no bruits. No lymphadenopathy or thryomegaly appreciated. Cor:RRR Lungs: + CTs./decreased bases  Abdomen: soft, nontender, nondistended. No hepatosplenomegaly. No bruits or masses. Good bowel sounds. Extremities: no cyanosis, clubbing, rash, 2+ edema in forearms 2+ LE edema + TED Neuro: alert & orientedx3, cranial nerves grossly intact. moves all 4 extremities w/o difficulty. Affect pleasant   Telemetry   In/out AF/NSR  Personally reviewed   Labs    CBC Recent Labs    07/07/23 0450 07/08/23 0435  WBC 16.3* 18.6*  HGB 7.7* 7.6*  HCT 25.8* 25.4*  MCV 80.6 81.4  PLT 341 388   Basic Metabolic Panel Recent Labs    78/29/56 0450 07/08/23 0435  NA 142 142  K 4.2 4.4  CL 112* 114*  CO2 22 24  GLUCOSE 126* 144*  BUN 58* 64*  CREATININE 2.02* 2.24*  CALCIUM 8.5* 8.3*   Liver Function Tests Recent Labs    07/07/23 0450 07/08/23 0435  AST 27 23  ALT 68* 58*  ALKPHOS 86 97  BILITOT 0.6 0.5  PROT 4.7* 5.0*  ALBUMIN 1.8* 1.8*    No results for input(s): "LIPASE", "AMYLASE" in the last 72 hours. Cardiac Enzymes No results for input(s): "CKTOTAL", "CKMB", "CKMBINDEX", "TROPONINI" in the last 72 hours.  BNP: BNP (last 3 results) Recent Labs  03/09/23 1544 03/11/23 0218 06/27/23 1116  BNP 660.9* 491.3* 646.4*    Imaging   DG CHEST PORT 1 VIEW Result Date: 07/08/2023 CLINICAL DATA:  73 year old male with chest tube, pleural effusion. EXAM: PORTABLE CHEST 1 VIEW COMPARISON:  Portable chest yesterday and earlier. FINDINGS: Portable AP semi upright view at 0518 hours. The right side pigtail chest tube has become on looped since 07/06/2023. Evidence of small volume right pleural effusion at the lung base there. Mild fluid possible also at the right minor fissure. No pneumothorax. Left chest tube is stable over these recent exams. Small volume left pleural effusion suspected. No pneumothorax identified. Stable left  subclavian approach central line. Stable cardiac size and mediastinal contours. Left chest cardiac loop recorder or superficial ICD. Nonspecific streaky right upper lung opacity persists. No areas of worsening ventilation. Stable left neck surgical clips. Visualized tracheal air column is within normal limits. No acute osseous abnormality identified. IMPRESSION: 1. Right pleural catheter is becoming un-looped. Stable left pleural catheter. Small volume residual effusions suspected. No pneumothorax. 2. Streaky right upper lung opacity. No new cardiopulmonary abnormality. Electronically Signed   By: Odessa Fleming M.D.   On: 07/08/2023 08:53   MR CARDIAC MORPHOLOGY W WO CONTRAST Result Date: 07/07/2023 CLINICAL DATA:  Clinical question of cardiac amyloidosis. EXAM: CARDIAC MRI TECHNIQUE: The patient was scanned on a 1.5 Tesla GE magnet. A dedicated cardiac coil was used. Functional imaging was done using Fiesta sequences. 2,3, and 4 chamber views were done to assess for RWMA's. Modified Simpson's rule using a short axis stack was used to calculate an ejection fraction on a dedicated work [employer]. The patient received 10 cc of Gadavist. After 10 minutes inversion recovery sequences were used to assess for infiltration and scar tissue. Flow quantification was performed 2 times during this examination with flow quantification performed at the levels of the ascending aorta above the valve, pulmonary artery above the valve. CONTRAST:  10 cc  of Gadavist FINDINGS: 1. Mild left ventricular dilation, with LVEDD 52 mm, but LVEDVi 91 mL/m2. Severe septal hypertrophy 15 mm. No systolic anterior motion of the mitral valve. Papillary muscle hypertrophy. Low normal left ventricular systolic function (LVEF =52%). Basal and mid inferolateral hypokinesis and thinning of myocardium (without notable scar). Left ventricular parametric mapping notable for normal T2. ECV elevated, severely (60%) in the mid- inferolateral  wall at worst. There is no late gadolinium enhancement in the left ventricular myocardium. Six standard deviation used. 2.  Dilated right ventricle, with RVEDVI 95 mL/m2. Normal right ventricular thickness. Normal right ventricular systolic function (RVEF =53%). 3. Mild left atrial dilation. Severe right atrial dilation, maximal right atrial indexed volume 79.85 ml/m2. 4. Normal size of the aortic root, ascending aorta. Mild main pulmonary artery dilation, 28 mm. 5. Valve assessment: Aortic Valve: Tri-leaflet aortic valve. Qualitatively there is no significant regurgitation. Regurgitant fraction 2%. Gradient 2 mm Hg. Pulmonic Valve: Qualitatively, there is no significant regurgitation. Regurgitant fraction 1%. Tricuspid Valve: Unclear morphology. Moderate regurgitation. Regurgitant fraction 22%. Mitral Valve: Mild regurgitation. Regurgitant fraction 12%. 6.  Normal pericardium.  No pericardial effusion. 7. Small bilateral pleural effusions. Bilateral patchy ground-glass opacification of the aerated lung fields, most prominent on the right side. Recommended dedicated study if concerned for non-cardiac pathology. IMPRESSION: 1. Study is less consistent with cardiac amyloidosis or infiltrative disease. Study is more consistent with inferolateral territory RCA or LCX disease (with ECV elevation but without notable scar). Though septum is 15 mm, there are no other  class features consistent with hypertrophic cardiomyopathy. 2.  Low normal LVEF (52%) with mild LV dilation. 3.  Right ventricular dilation with normalization of function. 4. Severe right atrial dilation. Mild pulmonary artery dilation. Moderate tricuspid regurgitation. Riley Lam MD Electronically Signed   By: Riley Lam M.D.   On: 07/07/2023 16:18   MR CARDIAC VELOCITY FLOW MAP Result Date: 07/07/2023 CLINICAL DATA:  Clinical question of cardiac amyloidosis. EXAM: CARDIAC MRI TECHNIQUE: The patient was scanned on a 1.5 Tesla GE  magnet. A dedicated cardiac coil was used. Functional imaging was done using Fiesta sequences. 2,3, and 4 chamber views were done to assess for RWMA's. Modified Simpson's rule using a short axis stack was used to calculate an ejection fraction on a dedicated work [employer], [employer]. The patient received 10 cc of Gadavist. After 10 minutes inversion recovery sequences were used to assess for infiltration and scar tissue. Flow quantification was performed 2 times during this examination with flow quantification performed at the levels of the ascending aorta above the valve, pulmonary artery above the valve. CONTRAST:  10 cc  of Gadavist FINDINGS: 1. Mild left ventricular dilation, with LVEDD 52 mm, but LVEDVi 91 mL/m2. Severe septal hypertrophy 15 mm. No systolic anterior motion of the mitral valve. Papillary muscle hypertrophy. Low normal left ventricular systolic function (LVEF =52%). Basal and mid inferolateral hypokinesis and thinning of myocardium (without notable scar). Left ventricular parametric mapping notable for normal T2. ECV elevated, severely (60%) in the mid- inferolateral wall at worst. There is no late gadolinium enhancement in the left ventricular myocardium. Six standard deviation used. 2.  Dilated right ventricle, with RVEDVI 95 mL/m2. Normal right ventricular thickness. Normal right ventricular systolic function (RVEF =53%). 3. Mild left atrial dilation. Severe right atrial dilation, maximal right atrial indexed volume 79.85 ml/m2. 4. Normal size of the aortic root, ascending aorta. Mild main pulmonary artery dilation, 28 mm. 5. Valve assessment: Aortic Valve: Tri-leaflet aortic valve. Qualitatively there is no significant regurgitation. Regurgitant fraction 2%. Gradient 2 mm Hg. Pulmonic Valve: Qualitatively, there is no significant regurgitation. Regurgitant fraction 1%. Tricuspid Valve: Unclear morphology. Moderate regurgitation. Regurgitant fraction 22%. Mitral Valve: Mild  regurgitation. Regurgitant fraction 12%. 6.  Normal pericardium.  No pericardial effusion. 7. Small bilateral pleural effusions. Bilateral patchy ground-glass opacification of the aerated lung fields, most prominent on the right side. Recommended dedicated study if concerned for non-cardiac pathology. IMPRESSION: 1. Study is less consistent with cardiac amyloidosis or infiltrative disease. Study is more consistent with inferolateral territory RCA or LCX disease (with ECV elevation but without notable scar). Though septum is 15 mm, there are no other class features consistent with hypertrophic cardiomyopathy. 2.  Low normal LVEF (52%) with mild LV dilation. 3.  Right ventricular dilation with normalization of function. 4. Severe right atrial dilation. Mild pulmonary artery dilation. Moderate tricuspid regurgitation. Riley Lam MD Electronically Signed   By: Riley Lam M.D.   On: 07/07/2023 16:18   MR CARDIAC VELOCITY FLOW MAP Result Date: 07/07/2023 CLINICAL DATA:  Clinical question of cardiac amyloidosis. EXAM: CARDIAC MRI TECHNIQUE: The patient was scanned on a 1.5 Tesla GE magnet. A dedicated cardiac coil was used. Functional imaging was done using Fiesta sequences. 2,3, and 4 chamber views were done to assess for RWMA's. Modified Simpson's rule using a short axis stack was used to calculate an ejection fraction on a dedicated work [employer] officer, [employer]. The patient received 10 cc of Gadavist. After 10 minutes inversion recovery sequences were  used to assess for infiltration and scar tissue. Flow quantification was performed 2 times during this examination with flow quantification performed at the levels of the ascending aorta above the valve, pulmonary artery above the valve. CONTRAST:  10 cc  of Gadavist FINDINGS: 1. Mild left ventricular dilation, with LVEDD 52 mm, but LVEDVi 91 mL/m2. Severe septal hypertrophy 15 mm. No systolic anterior motion of the mitral valve. Papillary  muscle hypertrophy. Low normal left ventricular systolic function (LVEF =52%). Basal and mid inferolateral hypokinesis and thinning of myocardium (without notable scar). Left ventricular parametric mapping notable for normal T2. ECV elevated, severely (60%) in the mid- inferolateral wall at worst. There is no late gadolinium enhancement in the left ventricular myocardium. Six standard deviation used. 2.  Dilated right ventricle, with RVEDVI 95 mL/m2. Normal right ventricular thickness. Normal right ventricular systolic function (RVEF =53%). 3. Mild left atrial dilation. Severe right atrial dilation, maximal right atrial indexed volume 79.85 ml/m2. 4. Normal size of the aortic root, ascending aorta. Mild main pulmonary artery dilation, 28 mm. 5. Valve assessment: Aortic Valve: Tri-leaflet aortic valve. Qualitatively there is no significant regurgitation. Regurgitant fraction 2%. Gradient 2 mm Hg. Pulmonic Valve: Qualitatively, there is no significant regurgitation. Regurgitant fraction 1%. Tricuspid Valve: Unclear morphology. Moderate regurgitation. Regurgitant fraction 22%. Mitral Valve: Mild regurgitation. Regurgitant fraction 12%. 6.  Normal pericardium.  No pericardial effusion. 7. Small bilateral pleural effusions. Bilateral patchy ground-glass opacification of the aerated lung fields, most prominent on the right side. Recommended dedicated study if concerned for non-cardiac pathology. IMPRESSION: 1. Study is less consistent with cardiac amyloidosis or infiltrative disease. Study is more consistent with inferolateral territory RCA or LCX disease (with ECV elevation but without notable scar). Though septum is 15 mm, there are no other class features consistent with hypertrophic cardiomyopathy. 2.  Low normal LVEF (52%) with mild LV dilation. 3.  Right ventricular dilation with normalization of function. 4. Severe right atrial dilation. Mild pulmonary artery dilation. Moderate tricuspid regurgitation. Riley Lam MD Electronically Signed   By: Riley Lam M.D.   On: 07/07/2023 16:18   Medications:   Scheduled Medications:  atorvastatin  80 mg Oral Daily   Chlorhexidine Gluconate Cloth  6 each Topical Daily   ezetimibe  10 mg Oral Daily   feeding supplement  237 mL Oral TID WC & HS   hydrALAZINE  100 mg Oral Q8H   insulin aspart  0-9 Units Subcutaneous Q4H   lidocaine  1 patch Transdermal Q24H   multivitamin with minerals  1 tablet Oral Daily   pantoprazole  40 mg Oral BID   sertraline  50 mg Oral QHS   sodium chloride flush  10-40 mL Intracatheter Q12H   sodium chloride flush  3 mL Intravenous Q12H   spironolactone  25 mg Oral Daily   torsemide  40 mg Oral Daily   Infusions:  amiodarone 30 mg/hr (07/08/23 1100)   heparin 1,200 Units/hr (07/08/23 1100)    PRN Medications: acetaminophen, docusate sodium, feeding supplement, HYDROmorphone (DILAUDID) injection, influenza vaccine adjuvanted, ondansetron (ZOFRAN) IV, mouth rinse, pneumococcal 20-valent conjugate vaccine, polyethylene glycol, sodium chloride flush, sodium chloride flush  Patient Profile   Eleftherios Dudenhoeffer is a 73 yo male with PMH of HTN, HLD, prior DVT, CVA, paroxysmal atrial fibrillation, CKD stage IIIb, carotid artery disease s/p endarterectomy, and chronic diastolic CHF. Admitted following out of hospital witnessed cardiac arrest.   Assessment/Plan  1. Cardiac arrest: Witnessed arrest with bystander then EMS CPR.  >20 minutes CPR.  He was shocked for VF.  Had additional 4-5 minutes PEA arrest in ER. ?PE with aspiration given bilateral DVTs.  - suspect due to PE. Cath stable CAD - VQ and LE/UE u/s + DVT  - No chest CT with renal failure  2. Cardiogenic shock: Initial echo: EF 45%, diffuse HK, severe LVH, normal RV size with mod decreased systolic function, small pericardial effusion, dilated IVC.  Suspicion for PE as above.  PNA on CXR, initial hypotension thought to be due to septic shock.  He is now off  pressors.  - co-ox high this am - watch for recurrent sepsis  3. Acute HF with mid range EF: With prominent RV failure. Initial echo: EF 45%, diffuse HK, severe LVH, normal RV size with mod decreased systolic function, small pericardial effusion, dilated IVC. As above, worry for PE with RV failure. Baseline cardiomyopathy with severe LVH raises concern for cardiac amyloidosis.  TEE 03/13: EF 55%, severe LVH with mild RV dysfunction, small IVC.  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 Mild PAH with severe RV failure though cardiac output is preserved.   - co-ox high off milrinone  - remains volume overloaded though CVP not that high. Increase torsemide to 40 bid  - Continue TED hose. - Continue hydralazine 100 tid for HTN.  - Continue spiro 25 mg daily - He should have workup for cardiac amyloidosis, Unfortunately unable to get cMRI due to metal plate in leg  4. Venous thromboembolism: Bilateral DVTs, mixed acute and chronic.  He is not anticoagulated at home.  With RV failure on echo, strong suspicion for PE.    Now s/p infrarenal IVC placement 3/16.   - V/Q 3/17 with multiple perfusion defects consistent with CXR findings. - No CTA with AKI.  - BUE [place] with SVTs and DVT.  - tolerating low-dose heparin today  5. CAD: NSTEMI with HS-TnI 3045.  ?Demand ischemia from arrest vs ACS.  He has history of carotid disease. - Plavix stopped with GI bleeding.  - Atorvastatin 80 daily.  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 Mild PAH with severe RV failure though cardiac output is preserved.    6. Carotid stenosis: h/o CVA.  TCAR in 6/24.  OK to stop Plavix at this point (now off).   7. Atrial fibrillation: He was not on AC at home though has history of prior CVA.  AC stopped due to prior GI bleeds. He went into AF/RVR here, tolerated poorly.  TEE/DCCV on 03/13.   -  In/out AF<->NSR. Continue amio  - Daily BMET  8. AKI on CKD 3b: Suspect ATN in setting of hypotension/arrest.  Baseline Scr seems to be around 2.1. Creatinine 2.55 > 2.28 > 2.05>1.53>1.77>1.75>1.71>>2.0 -> 2.2 today (post contrast from CT scan) - follow BMP closely   9. ID: PCT [id number], suspect PNA.   - He has completed course of ceftriaxone/doxycycline.  - watch for recurrent sepsin withr ising WBC and high co-ox  10. GI bleeding: Hematochezia with AC.  Had this in the past with [facility] as well.  GI following.  Has had transfusion here. EGD showed bleeding friable duodenal mucosa treated with hemostatic spray.  Had recurrent bleeding 3/20. Heparin held. CT angio of abdomen showed no active bleeding. No further gross bleeding. Hgb stable 7.7 - back on heparin . - Continue PPI - monitor H/H Hgb 7.6 today  -  transfuse < 7.5  12. Pleural Effusions - s/p CT placement per CCM 3/20  - transudative - Still draining. Leave in for now   13. Severe deconditioning - PT/OT  CRITICAL CARE Performed by: Arvilla Meres  Total critical care time: 45 minutes  Critical care time was exclusive of separately billable procedures and treating other patients.  Critical care was necessary to treat or prevent imminent or life-threatening deterioration.  Critical care was time spent personally by me (independent of midlevel providers or residents) on the following activities: development of treatment plan with patient and/or surrogate as well as nursing, discussions with consultants, evaluation of patient's response to treatment, examination of patient, obtaining history from patient or surrogate, ordering and performing treatments and interventions, ordering and review of laboratory studies, ordering and review of radiographic studies, pulse oximetry and re-evaluation of patient's condition.   Arvilla Meres MD 07/08/2023 12:39 PM

## 2023-07-08 NOTE — Progress Notes (Signed)
 [facility]ology Progress Note  Billy [name] 73 y.o. [birth year]  CC: GI bleed   Subjective: Patient seen and examined at bedside.  He denies any acute GI issues.  Discussed with staff at bedside.  No evidence of overt bleeding but had small amount of blood spot on the pad.  Appeared to be old blood per staff.  ROS : Afebrile, negative for chest pain   Objective: Vital signs in last 24 hours: Vitals:   07/08/23 1000 07/08/23 1100  BP: (!) 125/48 (!) 142/46  Pulse: 80 77  Resp: 14 18  Temp:    SpO2: 95% 97%    Physical Exam: Sick appearing patient, resting comfortably in the bed.  Not in acute distress.  Abdominal exam benign. Lab Results: Recent Labs    07/07/23 0450 07/08/23 0435  NA 142 142  K 4.2 4.4  CL 112* 114*  CO2 22 24  GLUCOSE 126* 144*  BUN 58* 64*  CREATININE 2.02* 2.24*  CALCIUM 8.5* 8.3*   Recent Labs    07/07/23 0450 07/08/23 0435  AST 27 23  ALT 68* 58*  ALKPHOS 86 97  BILITOT 0.6 0.5  PROT 4.7* 5.0*  ALBUMIN 1.8* 1.8*   Recent Labs    07/07/23 0450 07/08/23 0435  WBC 16.3* 18.6*  HGB 7.7* 7.6*  HCT 25.8* 25.4*  MCV 80.6 81.4  PLT 341 388   No results for input(s): "LABPROT", "INR" in the last 72 hours.    Assessment/Plan: -GI bleed with intermittent bright red blood blood per rectum.  EGD earlier during admission showed friable duodenal mucosa with evidence of bleeding which was treated with Hemospray.  Negative CT angio any active bleeding. -Abnormal CT scan concerning for mild wall thickening of the sigmoid colon and rectum. - Witnessed cardiac arrest likely from refractory V-fib and NSTEMI.  Status post CPR admission. -Extensive DVT with also concern for PE.   Recommendations ------------------------- -Continue heparin drip for now. -Continue to monitor H&H.  Transfuse as needed. -GI will follow.   Kathi Der MD, FACP 07/08/2023, 11:15 AM  Contact #  [phone]
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
## 2023-07-09 ENCOUNTER — Inpatient Hospital Stay (HOSPITAL_COMMUNITY)

## 2023-07-09 DIAGNOSIS — I480 Paroxysmal atrial fibrillation: Secondary | ICD-10-CM | POA: Diagnosis not present

## 2023-07-09 DIAGNOSIS — I469 Cardiac arrest, cause unspecified: Secondary | ICD-10-CM | POA: Diagnosis not present

## 2023-07-09 DIAGNOSIS — I10 Essential (primary) hypertension: Secondary | ICD-10-CM | POA: Diagnosis not present

## 2023-07-09 DIAGNOSIS — J690 Pneumonitis due to inhalation of food and vomit: Secondary | ICD-10-CM

## 2023-07-09 DIAGNOSIS — N1832 Chronic kidney disease, stage 3b: Secondary | ICD-10-CM | POA: Diagnosis not present

## 2023-07-09 DIAGNOSIS — I5033 Acute on chronic diastolic (congestive) heart failure: Secondary | ICD-10-CM | POA: Diagnosis not present

## 2023-07-09 LAB — BASIC METABOLIC PANEL
Anion gap: 8 (ref 5–15)
BUN: 72 mg/dL — ABNORMAL HIGH (ref 8–23)
CO2: 24 mmol/L (ref 22–32)
Calcium: 8.4 mg/dL — ABNORMAL LOW (ref 8.9–10.3)
Chloride: 109 mmol/L (ref 98–111)
Creatinine, Ser: 2.17 mg/dL — ABNORMAL HIGH (ref 0.61–1.24)
GFR, Estimated: 32 mL/min — ABNORMAL LOW (ref >60)
Glucose, Bld: 138 mg/dL — ABNORMAL HIGH (ref 70–99)
Potassium: 4.8 mmol/L (ref 3.5–5.1)
Sodium: 141 mmol/L (ref 135–145)

## 2023-07-09 LAB — EXPECTORATED SPUTUM ASSESSMENT W GRAM STAIN, RFLX TO RESP C

## 2023-07-09 LAB — CBC
HCT: 26 % — ABNORMAL LOW (ref 39.0–52.0)
Hemoglobin: 8 g/dL — ABNORMAL LOW (ref 13.0–17.0)
MCH: 24.8 pg — ABNORMAL LOW (ref 26.0–34.0)
MCHC: 30.8 g/dL (ref 30.0–36.0)
MCV: 80.5 fL (ref 80.0–100.0)
Platelets: 391 10*3/uL (ref 150–400)
RBC: 3.23 MIL/uL — ABNORMAL LOW (ref 4.22–5.81)
RDW: 19 % — ABNORMAL HIGH (ref 11.5–15.5)
WBC: 18.1 10*3/uL — ABNORMAL HIGH (ref 4.0–10.5)
nRBC: 0.1 % (ref 0.0–0.2)

## 2023-07-09 LAB — HEPARIN LEVEL (UNFRACTIONATED)
Heparin Unfractionated: <0.1 [IU]/mL — ABNORMAL LOW (ref 0.30–0.70)
Heparin Unfractionated: <0.1 [IU]/mL — ABNORMAL LOW (ref 0.30–0.70)
Heparin Unfractionated: <0.1 [IU]/mL — ABNORMAL LOW (ref 0.30–0.70)

## 2023-07-09 LAB — MAGNESIUM: Magnesium: 2.4 mg/dL (ref 1.7–2.4)

## 2023-07-09 LAB — COOXEMETRY PANEL
Carboxyhemoglobin: 1.9 % — ABNORMAL HIGH (ref 0.5–1.5)
Methemoglobin: 0.9 % (ref 0.0–1.5)
O2 Saturation: 95.1 %
Total hemoglobin: 7 g/dL — ABNORMAL LOW (ref 12.0–16.0)

## 2023-07-09 LAB — GLUCOSE, CAPILLARY
Glucose-Capillary: 117 mg/dL — ABNORMAL HIGH (ref 70–99)
Glucose-Capillary: 136 mg/dL — ABNORMAL HIGH (ref 70–99)

## 2023-07-09 MED ORDER — PIPERACILLIN-TAZOBACTAM 3.375 G IVPB
3.3750 g | Freq: Three times a day (TID) | INTRAVENOUS | Status: AC
Start: 2023-07-09 — End: 2023-07-16
  Administered 2023-07-09 – 2023-07-16 (×21): 3.375 g via INTRAVENOUS
  Filled 2023-07-09 (×21): qty 50

## 2023-07-09 MED ORDER — LINEZOLID 600 MG/300ML IV SOLN
600.0000 mg | Freq: Two times a day (BID) | INTRAVENOUS | Status: DC
  Administered 2023-07-09 – 2023-07-10 (×4): 600 mg via INTRAVENOUS
  Filled 2023-07-09 (×5): qty 300

## 2023-07-09 MED ORDER — FUROSEMIDE 10 MG/ML IJ SOLN
60.0000 mg | Freq: Two times a day (BID) | INTRAMUSCULAR | Status: DC
  Administered 2023-07-09 (×2): 60 mg via INTRAVENOUS
  Filled 2023-07-09 (×3): qty 6
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
## 2023-07-09 NOTE — Progress Notes (Signed)
 NAME:  Billy [name], MRN:  [id number], DOB:  June 21, 1950, LOS: 13 ADMISSION DATE:  20-Jul-2023, CONSULTATION DATE:  07-20-23 REFERRING MD:  ED Physician, CHIEF COMPLAINT:  Cardiac Arrest   History of Present Illness:  73 y/o male with h/o CHF, CKD stage IV, Atrial Fibrillation, BPH, Anemia, carotid artery disease s/p endarterectomy and stenting, chornic rhinitis, HLD and RLS who went to his neighbors house today after having SOB for at least 1 week and had a cardiac arrest.  Neighbor stated CPR immediately and EMS continued .  He got ROSC back after about 15 min.  Then in the ED he arrested again for about 2-5 min and then achieved ROSC.  In the ED he was given 1 Epi and had chest compressions.   In the ED he was seen by Cardiology for possible STEMI, but felt as though he was not a a STEMI.  Pertinent  Medical History  Chronic kidney disease, stage 4 (severe)  Atherosclerosis of renal artery Unspecified atrial fibrillation  Personal history of other venous thrombosis and embolism Chronic systolic heart failure  Significant Hospital Events: Including procedures, antibiotic start and stop dates in addition to other pertinent events   2023/07/20: Post code in field, coded x 3 min in ED, CPR and 1 epinephrine given to achieve ROSC, intubated 3/11: following commands  3/12: extubated on [facility] 3/13:  Con't on 5LNC. He says he feels sob with laying flat.  Underwent TEE which was negative for LAA thrombus therefore patient was cardioverted to NSR complications 3/14 No acute issues overnight, remains in NSR; developed BRBPR; heparin stopped 3/15 EGD: duodenitis, ongoing BRBPR but H/H stable 3/16 for IVC filter 3/20 Bilat effusions seen on CT abd/pelvis, PCCM consult, bilat chest tubes placed  3/24: left CT out yesterday, right remains in place.   Interim History / Subjective:  NAEON. 160cc out over night from right CT, 420cc/24hrs. Will need to keep right chest tube for now. Stopped his lasix with CVP of  2 and borderline BP this morning. Dc hydralazine. Monitor BP for now. No midodrine per HF for now. He really needs PT.   Objective   Blood pressure (!) 137/55, pulse 79, temperature 98.5 F (36.9 C), temperature source Oral, resp. rate (!) 21, height 6\' 1"  (1.854 m), weight 87.7 kg, SpO2 97%. CVP:  [7 mmHg-35 mmHg] 30 mmHg      Intake/Output Summary (Last 24 hours) at 07/09/2023 0806 Last data filed at 07/09/2023 0700 Gross per 24 hour  Intake 995.27 ml  Output 3125 ml  Net -2129.73 ml   Filed Weights   07/07/23 0500 07/08/23 0500 07/09/23 0524  Weight: 83.3 kg 85.5 kg 87.7 kg    Physical exam: General: older male, chronically ill appearing, sitting in bed, no acute distress  HEENT: [place]/AT, anicteric sclera, mucous membranes moist, nasal cannula  Neuro: somewhat drowsy but wakes easily to voice and answers appropriately, no focal findings, very weak globally  Chest: equal chest rise and fall, productive cough, nasal cannula, no wheezing, no respiratory distress. Right chest tube  Heart: s1s2, rrr, no m/r/g Abdomen: rounded, soft, ntnd Skin: warm, dry, intact   Right-sided chest tube output 420 cc in last 24 hours  Assessment & Plan:  Bilateral pleural effusions with septations status post bilateral chest tube placement Acute respiratory failure with hypoxia Concern for RUL pneumonia with ongoing leukocytosis, very high coox, thick sputum, ongoing oxygen requirement Pleural fluid studies from left side consistent with exudative by LDH criteria, low protein. No pleural  studies sent from the right chest. Left CT removed 3/23. Right chest tube with 420cc output/24 hours.  - con't right chest tube to -20cmH2O  - con't zosyn, linezolid for 7 days total (ending 3/29) - f/u sputum culture data   Acute on chronic HFrEF, ICM Mild PAH, severe RV failure Moderate TR Cardiac arrest PAD s/p CEA - GDMT limited by renal failure - appreciate AHF's management - recommend stopping lasix  today, CVP 2 and BP soft, worsening renal function  - dc hydralazine   L basilic vein, L cephalic vein SVT LLE DVT, RLE RVT - con't heparin   AKI on CKD 4 - trend bmp, mag, phos - replete elytes - strict I&O - Avoid nephrotoxic agents, renally dose medications - ensure adequate renal perfusion   Deconditioning, debility -PT, OT > needs  -OOB mobility as able  Anemia -transfuse for Hb <7 or hemodynamically significant bleeding  Management per primary team and advanced heart failure team. Would recommend pushing PT as he refused this AM and is very frail and weak.   CC time: na  Lenard Galloway [facility] 07/10/23 10:27 AM  Please see [email or web address] for pager details.  From 7A-7P if no response, please call [phone] After hours, please call ELink [phone]

## 2023-07-09 NOTE — Progress Notes (Signed)
 Progress Note   Patient: Billy [name] ZHY:[id number] DOB: 01-08-1951 DOA: 06/26/2023     13 DOS: the patient was seen and examined on 07/09/2023   Brief hospital course: 73 y/o male with h/o CHF, CKD stage IV, Atrial Fibrillation, BPH, Anemia, carotid artery disease s/p endarterectomy and stenting, chornic rhinitis, HLD and RLS who went to his neighbors house after having SOB for at least 1 week and had a cardiac arrest. Neighbor stated CPR immediately and EMS continued . He got ROSC back after about 15 min. Then in the ED he arrested again for about 2-5 min and then achieved ROSC. Patient was admitted to the ICU.   Patient found to have cardiogenic shock and required vasopressors.  Echocardiogram with mild reduction in LV systolic function. Positive bilateral deep vein thrombosis.    03/13 direct current cardioversion for atrial fibrillation. Patient noted to have hematochezia with acute blood loss anemia.   03/15 EGD duodenitis.  03/16 IVC filter placement.  03/17 resume IV heparin for anticoagulation. 03/18 completed antibiotic therapy for pneumonia.  03/20 recurrent lower gastrointestinal bleeding while on IV heparin. CT abdomen and pelvis with no active signs of bleeding. Positive bilateral pleural effusions, placed bilateral chest tubes.  03/21 no recurrent bleeding, hgb has been stable, 8,3 to 7.7, resumed heparin drip for anticoagulation.  03/22 bowel movement with dark brown red color, no low dose heparin.  03/ 23 left chest tube removed today. Positive right upper lobe infiltrate, consistent with aspiration pneumonia, started on antibiotic therapy.   Assessment and Plan: * Acute on chronic diastolic CHF (congestive heart failure) (HCC) Echocardiogram with preserved LV systolic function with EF 55%, severe LVH, interventricular septum is flattened in systole and diastole, RV systolic function with severe reduction, RA size with mild enlargement, small pericardial effusion.   03/19  cardiac catheterization  3 vessel coronary artery disease with CTO RCA with L > R collaterals and moderate non obstructive coronary artery disease in the left system.  RA 9  RV 24/16 PA 40/31 mean 30  PCWP 14  Cardiac output 7,4 and index 3,5 Fick, 7.3 and 3.5 thermodilution.  PVR 2.2 WU   Acute on chronic core pulmonale Pulmonary hypertension  RV failure   Urine output is 2,975 ml. Systolic blood pressure 140 mmHg range.   Plan to continue hydralazine and spironolactone.  Loop diuretic with torsemide 40 mg daily.   Recovered cardiac arrest, cardiogenic shock. (Resolved) Plan for further work up with cardiac MRI.   Acute hypoxemic respiratory failure due to cardiogenic pulmonary edema.  Oxygenation has improved, oxygenation is 95% on 1 L/min per .   Bilateral pleural effusions, sp bilateral chest tubes placement.  Pleural fluid consistent with exudate per Light's criteria, high LDH and high protein ratio. Follow up chest radiograph with improvement in pleural effusions, plan to remove left chest tube today.  Right tube continue to suction - 20 cm H20   PAF (paroxysmal atrial fibrillation) (HCC) Sp direct current cardioversion, patient in and out atrial fibrillation.  This morning he was on sinus rhythm.   Plan to continue with amiodarone and anticoagulation with iV heparin.   CKD stage 3b, GFR 30-44 ml/min (HCC) AKI, hypernatremia.   Renal function today with serum cr at 2,17 with K at 4,8 and serum bicarbonate at 24  Na 141 and Mg 2.4   Plan to continue close monitoring renal function and electrolytes.  Continue with  torsemide and continue spironolactone.   Hypertension Continue blood pressure control with hydralazine.  Aspiration pneumonia of right upper lobe (HCC) New right  upper lobe infiltrate, consistent with aspiration pneumonia.  Trach culture with rare gram positive cocci in pairs and clusters.  Patient has been placed on antibiotic therapy with Zosyn  and linezolid.  Continue close follow up on cell count and temperature curve.  Patient very weak and deconditioned poor prognosis.    Gastrointestinal hemorrhage with melena Endoscopy with erosive gastropathy with no bleeding and no stigmata of recent bleeding.  Bleeding friable duodenal mucosa. Hemostatic spry applied.  Patient will need colonic evaluation, currently not candidate for colonoscopy, but will benefit from CT scan.  If further drop in hgb will obtain scan as inpatient.   Acute blood loss anemia due to upper and possible lower GI  bleeding.   No further evidence of active bleeding, clinically and per CT.  Follow up hgb is 8.0   Plan to continue pantoprazole Low dose heparin infusion.   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 Acute deep vein thrombosis involving the left proximal profunda vein and left proximal femoral vein.  Indeterminate deep vein thrombosis involving the left popliteal vein, and left middle and distal femoral vein.   SP IV filter.  Anticoagulation with IV heparin.    Hyperlipidemia Continue statin therapy.       Subjective: Patient very weak and deconditioned, no chest pain, positive dyspnea on exertion, no apparent bowel movement today.   Physical Exam: Vitals:   07/09/23 1030 07/09/23 1100 07/09/23 1200 07/09/23 1449  BP: (!) 136/56 (!) 155/61 (!) 152/55 (!) 151/66  Pulse:  80 82   Resp:  (!) 24 (!) 27   Temp:      TempSrc:      SpO2:  97% 95%   Weight:      Height:       Neurology somnolent, easy to arouse, very weak and deconditioned ENT with mild pallor Cardiovascular with S1 and S2 present and regular with no gallops, or rubs, positive systolic murmur at the apex Respiratory with poor inspiratory effort, positive rales at the right upper lobe, on anterior auscultation.  Abdomen with no distention  Positive lower extremity edema, pitting +  Data  Reviewed:    Family Communication: no family at the bedside   Disposition: Status is: Inpatient Remains inpatient appropriate because: recovering from cardiac arrest   Planned Discharge Destination: to be determined       Author: Coralie Keens, MD 07/09/2023 2:59 PM  For on call review [email or web address].
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
## 2023-07-09 NOTE — Progress Notes (Signed)
 [facility] Gastroenterology Progress Note  Billy [name] 73 y.o. 1950-08-02  CC: GI bleed   Subjective: Patient seen and examined at bedside.  ICU staff is removing chest tube at this time.  Discussed with staff at bedside.  Had 1 episode of rectal bleeding yesterday with blood clots and old appearing blood.  ROS : Afebrile, negative for chest pain   Objective: Vital signs in last 24 hours: Vitals:   07/09/23 1030 07/09/23 1100  BP: (!) 136/56 (!) 155/61  Pulse:  80  Resp:  (!) 24  Temp:    SpO2:  97%    Physical Exam: Sick appearing patient, resting comfortably in the bed.  Not in acute distress.  Abdominal exam benign. Lab Results: Recent Labs    07/08/23 0435 07/09/23 0221  NA 142 141  K 4.4 4.8  CL 114* 109  CO2 24 24  GLUCOSE 144* 138*  BUN 64* 72*  CREATININE 2.24* 2.17*  CALCIUM 8.3* 8.4*  MG  --  2.4   Recent Labs    07/07/23 0450 07/08/23 0435  AST 27 23  ALT 68* 58*  ALKPHOS 86 97  BILITOT 0.6 0.5  PROT 4.7* 5.0*  ALBUMIN 1.8* 1.8*   Recent Labs    07/08/23 0435 07/08/23 1658 07/09/23 0221  WBC 18.6*  --  18.1*  HGB 7.6* 7.9* 8.0*  HCT 25.4* 26.8* 26.0*  MCV 81.4  --  80.5  PLT 388  --  391   No results for input(s): "LABPROT", "INR" in the last 72 hours.    Assessment/Plan: -GI bleed with intermittent bright red blood blood per rectum.  EGD earlier during admission showed friable duodenal mucosa with evidence of bleeding which was treated with Hemospray.  Negative CT angio any active bleeding. -Abnormal CT scan concerning for mild wall thickening of the sigmoid colon and rectum. - Witnessed cardiac arrest likely from refractory V-fib and NSTEMI.  Status post CPR admission. -Extensive DVT with also concern for PE.   Recommendations ------------------------- -Continue heparin drip for now.  1 episode of rectal bleeding with old appearing blood yesterday.  Hemoglobin stable. -Continue to monitor H&H.  Transfuse as needed. -GI will  follow.   Kathi Der MD, FACP 07/09/2023, 11:17 AM  Contact #  [phone]

## 2023-07-09 NOTE — Progress Notes (Signed)
 Pharmacy Antibiotic Note  Billy [name] is a 73 y.o. male admitted on 06/26/2023 with sepsis.  Pharmacy has been consulted for zosyn dosing.  Pt with previous antibiotics for CAP.  Now with elevated coox and rising WBC.  MD initiated linezolid, and pharmacy asked to add Zosyn  Plan: Linezolid per MD dosing Zosyn 3.375g IV q 8 hrs.  Height: 6\' 1"  (185.4 cm) Weight: 87.7 kg (193 lb 5.5 oz) IBW/kg (Calculated) : 79.9  Temp (24hrs), Avg:98.2 F (36.8 C), Min:97.8 F (36.6 C), Max:98.5 F (36.9 C)  Recent Labs  Lab 07/04/23 2100 07/05/23 0433 07/06/23 0510 07/07/23 0450 07/08/23 0435 07/09/23 0221  WBC  --  17.9* 16.0* 16.3* 18.6* 18.1*  CREATININE 1.76* 1.75* 1.71* 2.02* 2.24* 2.17*  LATICACIDVEN 1.5  --   --   --   --   --     Estimated Creatinine Clearance: 35.8 mL/min (A) (by C-G formula based on SCr of 2.17 mg/dL (H)).    Allergies  Allergen Reactions   Amlodipine Swelling    Excessive swelling and skin blotching    Morphine And Codeine Itching and Other (See Comments)    "Cannot handle it"    Ceftriaxone/doxy 3/11 > 3/15 Unasyn 3/12 Zyvox 3/23 >> Zosyn 3/23 >>  3/20 pleural fluid cx pending 3/11 trach cx: normal flora Resp PCR 3/11: neg COVID/Flu/RSV 3/11: neg MRSA PCR 3/10: neg  Thank you for allowing pharmacy to be a part of this patient's care.  [name], Colon Flattery, [facility] Clinical Pharmacist  07/09/2023 11:33 AM   [facility] pharmacy phone numbers are listed on [email or web address]

## 2023-07-09 NOTE — Progress Notes (Signed)
 PHARMACY - ANTICOAGULATION  Pharmacy Consult for Heparin Indication: DVTs, history of afib Brief A/P: Heparin level subtherapeutic Increase Heparin rate  Allergies  Allergen Reactions   Amlodipine Swelling    Excessive swelling and skin blotching    Morphine And Codeine Itching and Other (See Comments)    "Cannot handle it"    Patient Measurements: Height: 6\' 1"  (185.4 cm) Weight: 85.5 kg (188 lb 7.9 oz) IBW/kg (Calculated) : 79.9 Heparin Dosing Weight: 80.3kg  Vital Signs: Temp: 98.3 F (36.8 C) (03/22 2319) Temp Source: Axillary (03/22 2319) BP: 133/51 (03/23 0200) Pulse Rate: 80 (03/23 0200)  Labs: Recent Labs    07/07/23 0450 07/07/23 1812 07/08/23 0435 07/08/23 0458 07/08/23 1501 07/08/23 1658 07/09/23 0221  HGB 7.7*  --  7.6*  --   --  7.9* 8.0*  HCT 25.8*  --  25.4*  --   --  26.8* 26.0*  PLT 341  --  388  --   --   --  391  HEPARINUNFRC  --    < >  --  <0.10* <0.10*  --  <0.10*  CREATININE 2.02*  --  2.24*  --   --   --  2.17*   < > = values in this interval not displayed.    Estimated Creatinine Clearance: 35.8 mL/min (A) (by C-G formula based on SCr of 2.17 mg/dL (H)).  Assessment: 73 y.o. male with DVT and h/o GIB for heparin  Goal of Therapy:  Heparin level 0.3-0.5 units/ml Monitor platelets by anticoagulation protocol: Yes   Plan:  Increase Heparin 1400 units/hr Check heparin level in 8 hours.  Geannie Risen, PharmD, BCPS

## 2023-07-09 NOTE — Assessment & Plan Note (Addendum)
 New right  upper lobe infiltrate, consistent with aspiration pneumonia.  Trach culture with rare gram positive cocci in pairs and clusters.  Patient has been placed on antibiotic therapy with Zosyn and linezolid.  Continue close follow up on cell count and temperature curve.  Patient very weak and deconditioned poor prognosis.

## 2023-07-10 ENCOUNTER — Inpatient Hospital Stay (HOSPITAL_COMMUNITY)

## 2023-07-10 DIAGNOSIS — Z4682 Encounter for fitting and adjustment of non-vascular catheter: Secondary | ICD-10-CM

## 2023-07-10 DIAGNOSIS — Z515 Encounter for palliative care: Secondary | ICD-10-CM | POA: Diagnosis not present

## 2023-07-10 DIAGNOSIS — J181 Lobar pneumonia, unspecified organism: Secondary | ICD-10-CM

## 2023-07-10 DIAGNOSIS — J90 Pleural effusion, not elsewhere classified: Secondary | ICD-10-CM

## 2023-07-10 DIAGNOSIS — I824Y3 Acute embolism and thrombosis of unspecified deep veins of proximal lower extremity, bilateral: Secondary | ICD-10-CM | POA: Diagnosis not present

## 2023-07-10 DIAGNOSIS — I469 Cardiac arrest, cause unspecified: Secondary | ICD-10-CM | POA: Diagnosis not present

## 2023-07-10 DIAGNOSIS — Z7189 Other specified counseling: Secondary | ICD-10-CM

## 2023-07-10 DIAGNOSIS — I5033 Acute on chronic diastolic (congestive) heart failure: Secondary | ICD-10-CM | POA: Diagnosis not present

## 2023-07-10 DIAGNOSIS — K921 Melena: Secondary | ICD-10-CM | POA: Diagnosis not present

## 2023-07-10 LAB — BASIC METABOLIC PANEL
Anion gap: 7 (ref 5–15)
BUN: 67 mg/dL — ABNORMAL HIGH (ref 8–23)
CO2: 27 mmol/L (ref 22–32)
Calcium: 7.8 mg/dL — ABNORMAL LOW (ref 8.9–10.3)
Chloride: 105 mmol/L (ref 98–111)
Creatinine, Ser: 2.29 mg/dL — ABNORMAL HIGH (ref 0.61–1.24)
GFR, Estimated: 30 mL/min — ABNORMAL LOW (ref >60)
Glucose, Bld: 108 mg/dL — ABNORMAL HIGH (ref 70–99)
Potassium: 4.3 mmol/L (ref 3.5–5.1)
Sodium: 139 mmol/L (ref 135–145)

## 2023-07-10 LAB — CBC
HCT: 24.8 % — ABNORMAL LOW (ref 39.0–52.0)
Hemoglobin: 7.5 g/dL — ABNORMAL LOW (ref 13.0–17.0)
MCH: 24.2 pg — ABNORMAL LOW (ref 26.0–34.0)
MCHC: 30.2 g/dL (ref 30.0–36.0)
MCV: 80 fL (ref 80.0–100.0)
Platelets: 402 10*3/uL — ABNORMAL HIGH (ref 150–400)
RBC: 3.1 MIL/uL — ABNORMAL LOW (ref 4.22–5.81)
RDW: 19 % — ABNORMAL HIGH (ref 11.5–15.5)
WBC: 16.8 10*3/uL — ABNORMAL HIGH (ref 4.0–10.5)
nRBC: 0.1 % (ref 0.0–0.2)

## 2023-07-10 LAB — BODY FLUID CULTURE W GRAM STAIN
Culture: NO GROWTH
Gram Stain: NONE SEEN

## 2023-07-10 LAB — COOXEMETRY PANEL
Carboxyhemoglobin: 2.1 % — ABNORMAL HIGH (ref 0.5–1.5)
Methemoglobin: <0.7 % (ref 0.0–1.5)
O2 Saturation: 77 %
Total hemoglobin: 7.7 g/dL — ABNORMAL LOW (ref 12.0–16.0)

## 2023-07-10 LAB — GLUCOSE, CAPILLARY
Glucose-Capillary: 139 mg/dL — ABNORMAL HIGH (ref 70–99)
Glucose-Capillary: 175 mg/dL — ABNORMAL HIGH (ref 70–99)

## 2023-07-10 LAB — CYTOLOGY - NON PAP

## 2023-07-10 LAB — HEPARIN LEVEL (UNFRACTIONATED)
Heparin Unfractionated: 0.3 [IU]/mL (ref 0.30–0.70)
Heparin Unfractionated: 0.16 [IU]/mL — ABNORMAL LOW (ref 0.30–0.70)

## 2023-07-10 LAB — PROCALCITONIN: Procalcitonin: 0.38 ng/mL

## 2023-07-10 MED ORDER — MIDODRINE HCL 5 MG PO TABS: 5.0000 mg | ORAL_TABLET | Freq: Two times a day (BID) | ORAL | Status: DC

## 2023-07-10 MED ORDER — HYDROMORPHONE HCL 1 MG/ML IJ SOLN
0.5000 mg | INTRAMUSCULAR | Status: DC | PRN
  Administered 2023-07-10 – 2023-07-20 (×37): 0.5 mg via INTRAVENOUS
  Filled 2023-07-10 (×37): qty 0.5

## 2023-07-10 MED ORDER — OXYCODONE HCL 5 MG PO TABS
5.0000 mg | ORAL_TABLET | ORAL | Status: DC | PRN
  Administered 2023-07-10 – 2023-07-20 (×15): 5 mg via ORAL
  Filled 2023-07-10 (×15): qty 1

## 2023-07-10 MED ORDER — FUROSEMIDE 10 MG/ML IJ SOLN
60.0000 mg | Freq: Two times a day (BID) | INTRAMUSCULAR | Status: DC
Start: 2023-07-10 — End: 2023-07-11
  Administered 2023-07-10 – 2023-07-11 (×3): 60 mg via INTRAVENOUS
  Filled 2023-07-10 (×2): qty 6

## 2023-07-10 NOTE — Progress Notes (Signed)
 NAME:  Billy [name], MRN:  [id number], DOB:  10-Dec-1950, LOS: 14 ADMISSION DATE:  June 27, 2023, CONSULTATION DATE:  06-27-2023 REFERRING MD:  ED Physician, CHIEF COMPLAINT:  Cardiac Arrest   History of Present Illness:  73 y/o male with h/o CHF, CKD stage IV, Atrial Fibrillation, BPH, Anemia, carotid artery disease s/p endarterectomy and stenting, chornic rhinitis, HLD and RLS who went to his neighbors house today after having SOB for at least 1 week and had a cardiac arrest.  Neighbor stated CPR immediately and EMS continued .  He got ROSC back after about 15 min.  Then in the ED he arrested again for about 2-5 min and then achieved ROSC.  In the ED he was given 1 Epi and had chest compressions.   In the ED he was seen by Cardiology for possible STEMI, but felt as though he was not a a STEMI.  Pertinent  Medical History  Chronic kidney disease, stage 4 (severe)  Atherosclerosis of renal artery Unspecified atrial fibrillation  Personal history of other venous thrombosis and embolism Chronic systolic heart failure  Significant Hospital Events: Including procedures, antibiotic start and stop dates in addition to other pertinent events   2023/06/27: Post code in field, coded x 3 min in ED, CPR and 1 epinephrine given to achieve ROSC, intubated 3/11: following commands  3/12: extubated on [facility] 3/13:  Con't on 5LNC. He says he feels sob with laying flat.  Underwent TEE which was negative for LAA thrombus therefore patient was cardioverted to NSR complications 3/14 No acute issues overnight, remains in NSR; developed BRBPR; heparin stopped 3/15 EGD: duodenitis, ongoing BRBPR but H/H stable 3/16 for IVC filter 3/20 Bilat effusions seen on CT abd/pelvis, PCCM consult, bilat chest tubes placed  3/24: left CT out yesterday, right remains in place.   Interim History / Subjective:  NAEON. 160cc out over night from right CT, 420cc/24hrs. Will need to keep right chest tube for now. Stopped his lasix with CVP of  2 and borderline BP this morning. Dc hydralazine. Monitor BP for now. No midodrine per HF for now. He really needs PT.   Objective   Blood pressure (!) 123/45, pulse 60, temperature 98.1 F (36.7 C), temperature source Axillary, resp. rate (!) 22, height 6\' 1"  (1.854 m), weight 85.6 kg, SpO2 95%. CVP:  [3 mmHg-8 mmHg] 3 mmHg      Intake/Output Summary (Last 24 hours) at 07/10/2023 1045 Last data filed at 07/10/2023 1000 Gross per 24 hour  Intake 1950.39 ml  Output 4315 ml  Net -2364.61 ml   Filed Weights   07/08/23 0500 07/09/23 0524 07/10/23 0515  Weight: 85.5 kg 87.7 kg 85.6 kg    Physical exam: General: older male, chronically ill appearing, sitting in bed, no acute distress  HEENT: [place]/AT, anicteric sclera, mucous membranes moist, nasal cannula  Neuro: somewhat drowsy but wakes easily to voice and answers appropriately, no focal findings, very weak globally  Chest: equal chest rise and fall, productive cough, nasal cannula, no wheezing, no respiratory distress. Right chest tube  Heart: s1s2, rrr, no m/r/g Abdomen: rounded, soft, ntnd Skin: warm, dry, intact   Right-sided chest tube output 420 cc in last 24 hours  Assessment & Plan:  Bilateral pleural effusions with septations status post bilateral chest tube placement Acute respiratory failure with hypoxia Concern for RUL pneumonia with ongoing leukocytosis, very high coox, thick sputum, ongoing oxygen requirement Pleural fluid studies from left side consistent with exudative by LDH criteria, low protein. No pleural  studies sent from the right chest. Left CT removed 3/23. Right chest tube with 420cc output/24 hours.  - con't right chest tube to -20cmH2O  - con't zosyn, linezolid for 7 days total (ending 3/29) - f/u sputum culture data   Acute on chronic HFrEF, ICM Mild PAH, severe RV failure Moderate TR Cardiac arrest PAD s/p CEA - GDMT limited by renal failure - appreciate AHF's management - recommend stopping lasix  today, CVP 2 and BP soft, worsening renal function  - dc hydralazine   L basilic vein, L cephalic vein SVT LLE DVT, RLE RVT - con't heparin   AKI on CKD 4 - trend bmp, mag, phos - replete elytes - strict I&O - Avoid nephrotoxic agents, renally dose medications - ensure adequate renal perfusion   Deconditioning, debility -PT, OT > needs  -OOB mobility as able  Anemia -transfuse for Hb <7 or hemodynamically significant bleeding  Management per primary team and advanced heart failure team. Would recommend pushing PT as he refused this AM and is very frail and weak.   CC time: na  Lenard Galloway [facility] 07/10/23 10:45 AM  Please see [email or web address] for pager details.  From 7A-7P if no response, please call [phone] After hours, please call ELink [phone]
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
## 2023-07-10 NOTE — Progress Notes (Signed)
 [facility] Gastroenterology Progress Note  Maddax Palinkas 73 y.o. October 11, 1950   Subjective: No bleeding or BMs overnight. Denies abdominal pain. Nurse present.  Objective: Vital signs: Vitals:   07/10/23 0800 07/10/23 0830  BP: (!) 105/38 (!) 106/42  Pulse: 61 62  Resp: (!) 21 (!) 22  Temp: 98.1 F (36.7 C)   SpO2: 95% 94%    Physical Exam: Gen: lethargic, chronically ill-appearing, no acute distress, thin HEENT: anicteric sclera CV: RRR Chest: CTA B Abd: minimal diffuse tenderness with guarding, soft, nondistended, +BS Ext: no edema  Lab Results: Recent Labs    07/09/23 0221 07/10/23 0400  NA 141 139  K 4.8 4.3  CL 109 105  CO2 24 27  GLUCOSE 138* 108*  BUN 72* 67*  CREATININE 2.17* 2.29*  CALCIUM 8.4* 7.8*  MG 2.4  --    Recent Labs    07/08/23 0435  AST 23  ALT 58*  ALKPHOS 97  BILITOT 0.5  PROT 5.0*  ALBUMIN 1.8*   Recent Labs    07/09/23 0221 07/10/23 0400  WBC 18.1* 16.8*  HGB 8.0* 7.5*  HCT 26.0* 24.8*  MCV 80.5 80.0  PLT 391 402*      Assessment/Plan: GIB - s/p hemostasis with Purastat gel to bleeding area in duodenum (3/15) without any signs of ongoing bleeding. Hgb 7.5 (8.0). On Heparin drip. Will sign off. Call if questions.    [name] 07/10/2023, 9:47 AM  Questions please call [phone] ID: Hunt Oris, male   DOB: [birth year], 73 y.o.   MRN: [id number]

## 2023-07-10 NOTE — Progress Notes (Signed)
 Afternoon rounds:  Got out of bed to chair today with some encouragement. Keeping chest tube until tomorrow and can reassess about removal again. If continues to have ongoing output will need culture of this space.
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
## 2023-07-10 NOTE — Progress Notes (Signed)
 Patient ID: Billy [name], male   DOB: Feb 11, 1951, 73 y.o.   MRN: [id number]     Advanced Heart Failure Rounding Note  Cardiologist: Nanetta Batty, MD   Chief Complaint: RV failure  Subjective:   Events -  3/10 admitted with OOH cardiac arrest. 20 min CPR. Shocked for VF - In/out AF -> IV amio - Echo EF 45% with RV dysfunction -> suspect PE. No CT with AKI - U/s upper and lower extremities with DVTs - V/Q 3/17 with multiple perfusion defects consistent with CXR findings. ->  IVC filter placed due to GIB - EGD (3/15) showed bleeding friable duodenal mucosa treated with hemostatic spray. No overt GI bleeding today, hgb 8.9.  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 Mild PAH with severe RV failure though cardiac output is preserved. Hep gtt restarted post-cath - Recurrent GIB 3/20. CT angio of abdomen showed no active bleeding. + large bilateral effusion - Bilateral CTs placed 3/20 (transudative)   Abx restarted yesterday d/t rising WBC and CO-OX. AF. WBCs 18>16.8K.   L chest tube removed yesterday, 360 cc out from R chest tube last 24 hrs  CO-OX 77% today. CVP 2 but + JVD on exam. Diuretics held this am.  In and out of AFL overnight, currently SR 60s on IV amio.  Objective:     Weight Range: 85.6 kg Body mass index is 24.9 kg/m.   Vital Signs: Temp:  [98.4 F (36.9 C)-98.5 F (36.9 C)] 98.5 F (36.9 C) (03/23 1920) Pulse Rate:  [62-107] 62 (03/24 0600) Resp:  [14-27] 18 (03/24 0600) BP: (108-157)/(38-67) 124/49 (03/24 0600) SpO2:  [94 %-98 %] 98 % (03/24 0600) Weight:  [85.6 kg] 85.6 kg (03/24 0515) Last BM Date : 07/08/23  Weight change: Filed Weights   07/08/23 0500 07/09/23 0524 07/10/23 0515  Weight: 85.5 kg 87.7 kg 85.6 kg   Intake/Output:  Intake/Output Summary (Last 24 hours) at 07/10/2023 0741 Last data filed at 07/10/2023 1324 Gross per 24 hour  Intake 2047.95 ml  Output 4570 ml  Net -2522.05  ml    Physical Exam   General:  Fatigued appearing. Sitting up in bed.  Neck: JVD 8-9 cm Cor: Regular rate & rhythm. No rubs, gallops or murmurs. Lungs: diminished in bases, + CT Abdomen: soft, nontender, nondistended.  Extremities: 2+ upper and lower extremity edema, + TED hose Neuro: alert & orientedx3. Affect flat    Telemetry   In and out of AFL, currently SR 60s   Labs    CBC Recent Labs    07/09/23 0221 07/10/23 0400  WBC 18.1* 16.8*  HGB 8.0* 7.5*  HCT 26.0* 24.8*  MCV 80.5 80.0  PLT 391 402*   Basic Metabolic Panel Recent Labs    40/10/27 0221 07/10/23 0400  NA 141 139  K 4.8 4.3  CL 109 105  CO2 24 27  GLUCOSE 138* 108*  BUN 72* 67*  CREATININE 2.17* 2.29*  CALCIUM 8.4* 7.8*  MG 2.4  --    Liver Function Tests Recent Labs    07/08/23 0435  AST 23  ALT 58*  ALKPHOS 97  BILITOT 0.5  PROT 5.0*  ALBUMIN 1.8*    No results for input(s): "LIPASE", "AMYLASE" in the last 72 hours. Cardiac Enzymes No results for input(s): "CKTOTAL", "CKMB", "CKMBINDEX", "TROPONINI" in the last 72 hours.  BNP: BNP (last 3 results) Recent Labs    03/09/23 1544 03/11/23 0218 06/27/23 1116  BNP 660.9* 491.3* 646.4*    Imaging   DG CHEST PORT 1 VIEW Result Date: 07/10/2023 CLINICAL DATA:  [id number].  Chest tube in place. EXAM: PORTABLE CHEST 1 VIEW COMPARISON:  Portable chest yesterday at 8:27 a.m. FINDINGS: 5:06 a.m. Interval removal of left basal pigtail chest tube. No measurable pneumothorax. Pigtail right tube thoracostomy in the lateral base is unchanged in positioning with no measurable pneumothorax. There are small pleural effusions. Interstitial and patchy hazy/coarsely nodular opacities of the right lung fields and left base persist unchanged. Left mid/upper lung field is clear. The left subclavian central line tip remains in distal SVC. Loop recorder device superimposes on the left. There is stable cardiomegaly. Central vessels are normal caliber. Stable  mediastinum. There is aortic atherosclerosis.  No new osseous findings. IMPRESSION: 1. Interval removal of left pigtail chest tube. No measurable pneumothorax. 2. Stable right pigtail chest tube with no measurable pneumothorax. 3. Stable small pleural effusions. 4. Stable interstitial and patchy hazy/coarsely nodular opacities of the right lung fields and left base. 5. Cardiomegaly and aortic atherosclerosis. Electronically Signed   By: Almira Bar M.D.   On: 07/10/2023 06:23   DG CHEST PORT 1 VIEW Result Date: 07/09/2023 CLINICAL DATA:  Chest tube. EXAM: PORTABLE CHEST 1 VIEW COMPARISON:  07/08/2023 and CT chest 06/29/2017. FINDINGS: Trachea is midline. Heart is enlarged, stable. Loop recorder in place over the left chest. Left subclavian central line tip is in the SVC. Small bore chest tubes in both lower hemithoraces. No pneumothorax. Small loculated left pleural effusion. No pneumothorax. Mixed interstitial and airspace opacification bilaterally, right greater than left, unchanged from 07/08/2023. IMPRESSION: 1. Bilateral chest tubes in place. Small loculated left pleural effusion, stable. No pneumothorax. 2. Patchy mixed interstitial and airspace opacification, right greater than left, stable, possibly due to an atypical or viral pneumonia. Electronically Signed   By: Leanna Battles M.D.   On: 07/09/2023 10:00    Medications:   Scheduled Medications:  atorvastatin  80 mg Oral Daily   Chlorhexidine Gluconate Cloth  6 each Topical Daily   ezetimibe  10 mg Oral Daily   feeding supplement  237 mL Oral TID WC & HS   furosemide  60 mg Intravenous BID   hydrALAZINE  100 mg Oral Q8H   lidocaine  1 patch Transdermal Q24H   multivitamin with minerals  1 tablet Oral Daily   pantoprazole  40 mg Oral BID   sertraline  50 mg Oral QHS   sodium chloride flush  10-40 mL Intracatheter Q12H   sodium chloride flush  3 mL Intravenous Q12H   spironolactone  25 mg Oral Daily   Infusions:  amiodarone 30  mg/hr (07/10/23 0600)   heparin 1,650 Units/hr (07/10/23 0600)   linezolid (ZYVOX) IV Stopped (07/09/23 2208)   piperacillin-tazobactam (ZOSYN)  IV 12.5 mL/hr at 07/10/23 0600    PRN Medications: acetaminophen, docusate sodium, feeding supplement, HYDROmorphone (DILAUDID) injection, influenza vaccine adjuvanted, ondansetron (ZOFRAN) IV, mouth rinse, pneumococcal 20-valent conjugate vaccine, polyethylene glycol, sodium chloride flush, sodium chloride flush  Patient Profile   Billy [name] is a 73 yo male with PMH of HTN, HLD, prior DVT, CVA, paroxysmal atrial fibrillation, CKD stage IIIb, carotid artery disease s/p endarterectomy, and chronic diastolic CHF. Admitted following out of hospital witnessed cardiac arrest.   Assessment/Plan   1. Cardiac arrest:  - Witnessed arrest with bystander then EMS CPR.  >20 minutes CPR.  He was shocked for VF.  Had additional 4-5 minutes PEA arrest in ER. ?PE  with aspiration given bilateral DVTs.  - suspect due to PE. Cath stable CAD - VQ and LE/UE u/s + DVT  - No chest CT with renal failure  2. Shock, cardiogenic +/- septic component: Initial echo: EF 45%, diffuse HK, severe LVH, normal RV size with mod decreased systolic function, small pericardial effusion, dilated IVC.  Suspicion for PE as above.  PNA on CXR, initial hypotension thought to be due to septic shock.  He is now off pressors.  - Started on Zosyn/Linezolid 03/23 with rising CO-OX, WBCs and productive cough. Resp cultures pending. Ordered PCT. Afebrile.  3. Acute HF with mid range EF: With prominent RV failure. Initial echo: EF 45%, diffuse HK, severe LVH, normal RV size with mod decreased systolic function, small pericardial effusion, dilated IVC. As above, worry for PE with RV failure. Baseline cardiomyopathy with severe LVH raises concern for cardiac amyloidosis.  TEE 03/13: EF 55%, severe LVH with mild RV dysfunction, small IVC.  - L/RHC 07/05/23: 3v CAD with CTO RCA with L->R collaterals and  moderate non-obstructive CAD in left system. Mild PAH with severe RV failure though cardiac output is preserved.   - CO-OX lower 77% today. Back on IV abx. Not on inotrope support. - CVP only 2 but appears volume up on physical exam. Give IV lasix 60 BID, good response yesterday. He is edematous. Note albumin 1.8.  - Continue TED hose. - BP soft today. Hold hydralazine and spiro. - He should have workup for cardiac amyloidosis, Unfortunately unable to get cMRI due to metal plate in leg  4. Venous thromboembolism: Bilateral upper and lower DVTs, mixed acute and chronic.  He is not anticoagulated at home.  With RV failure on echo, strong suspicion for PE.  - s/p infrarenal IVC placement 3/16.   - V/Q 3/17 with multiple perfusion defects consistent with CXR findings. - No CTA with AKI.  - BUE [place] with SVTs and DVT.  - Tolerating low-dose heparin.   5. CAD: NSTEMI with HS-TnI 3045.  ?Demand ischemia from arrest vs ACS.  He has history of carotid disease. - Plavix stopped with GI bleeding.  - Atorvastatin 80 daily.  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 Mild PAH with severe RV failure though cardiac output is preserved.    6. Carotid stenosis: h/o CVA.  TCAR in 6/24.  Okay to remain off plavix at this point    7. Persistent atrial fibrillation: He was not on AC at home though has history of prior CVA.  AC stopped due to prior GI bleeds. He went into AF/RVR here, tolerated poorly.  TEE/DCCV on 03/13.   - In and out of AFL overnight. Currently in SR. Continue amio  - Daily BMET - back on low-dose heparin. Following closely with GIB. Denies recurrent bleeding over last 24 hrs  8. AKI on CKD 3b: Suspect ATN in setting of hypotension/arrest.  Baseline Scr seems to be around 2.1. Creatinine 2.55 >>>1.71>>2.3 today (had contrast for CT 03/20) - follow BMP closely  - hold spiro for soft BP  9. ID: PCT 8.37 on 03/11, suspect PNA.    - He has completed course of ceftriaxone/doxycycline.  - Abx restarted 03/23 as above. Concern for aspiration PNA. Repeat PCT pending.Resp culture pending.  10. GI bleeding: Hematochezia with AC.  Had this in the past with [facility] as well.  GI following.  Has had transfusion here. EGD showed bleeding friable duodenal mucosa treated  with hemostatic spray.  Had recurrent bleeding 3/20. Heparin held. CT angio of abdomen showed no active bleeding. - back on heparin. - Continue PPI - monitor H/H. Hgb 7.5 today. - transfuse < 7  12. Pleural Effusions - s/p CT placement per CCM 3/20  - transudative - Left chest tube removed 3/23 - Right CT still draining  13. Severe deconditioning - PT/OT - Will need inpatient rehab  14. Severe protein-calorie malnutrition - Dietician consulted  15. GOC - multiple markers of poor prognosis - Palliative care consult.  CRITICAL CARE Performed by: Anna Genre N   Total critical care time: 18 minutes  Critical care time was exclusive of separately billable procedures and treating other patients.  Critical care was necessary to treat or prevent imminent or life-threatening deterioration.  Critical care was time spent personally by me on the following activities: development of treatment plan with patient and/or surrogate as well as nursing, discussions with consultants, evaluation of patient's response to treatment, examination of patient, obtaining history from patient or surrogate, ordering and performing treatments and interventions, ordering and review of laboratory studies, ordering and review of radiographic studies, pulse oximetry and re-evaluation of patient's condition.    Yuliana Vandrunen N PA-C 07/10/2023 7:41 AM
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
## 2023-07-10 NOTE — Progress Notes (Signed)
 Inpatient Rehab Admissions Coordinator:    I saw Pt. To discuss potential CIR admit.  I also spoke with Pt.'s son and daughter. Pt. Does not have adequate support for after CIR, and they are interested in SNF. I will sign off and notify TOC.  Megan Salon, MS, CCC-SLP Rehab Admissions Coordinator  [phone] (celll) [phone] (office)

## 2023-07-11 ENCOUNTER — Inpatient Hospital Stay (HOSPITAL_COMMUNITY)

## 2023-07-11 DIAGNOSIS — K921 Melena: Secondary | ICD-10-CM | POA: Diagnosis not present

## 2023-07-11 DIAGNOSIS — Z7189 Other specified counseling: Secondary | ICD-10-CM | POA: Diagnosis not present

## 2023-07-11 DIAGNOSIS — I5033 Acute on chronic diastolic (congestive) heart failure: Secondary | ICD-10-CM | POA: Diagnosis not present

## 2023-07-11 DIAGNOSIS — I824Y3 Acute embolism and thrombosis of unspecified deep veins of proximal lower extremity, bilateral: Secondary | ICD-10-CM | POA: Diagnosis not present

## 2023-07-11 DIAGNOSIS — J181 Lobar pneumonia, unspecified organism: Secondary | ICD-10-CM

## 2023-07-11 DIAGNOSIS — Z4682 Encounter for fitting and adjustment of non-vascular catheter: Secondary | ICD-10-CM

## 2023-07-11 DIAGNOSIS — I469 Cardiac arrest, cause unspecified: Secondary | ICD-10-CM | POA: Diagnosis not present

## 2023-07-11 DIAGNOSIS — Z515 Encounter for palliative care: Secondary | ICD-10-CM | POA: Diagnosis not present

## 2023-07-11 LAB — COOXEMETRY PANEL
Carboxyhemoglobin: 3.3 % — ABNORMAL HIGH (ref 0.5–1.5)
Methemoglobin: 0.8 % (ref 0.0–1.5)
O2 Saturation: 91.9 %
Total hemoglobin: 7.6 g/dL — ABNORMAL LOW (ref 12.0–16.0)

## 2023-07-11 LAB — BASIC METABOLIC PANEL
Anion gap: 6 (ref 5–15)
BUN: 65 mg/dL — ABNORMAL HIGH (ref 8–23)
CO2: 28 mmol/L (ref 22–32)
Calcium: 7.9 mg/dL — ABNORMAL LOW (ref 8.9–10.3)
Chloride: 105 mmol/L (ref 98–111)
Creatinine, Ser: 2.38 mg/dL — ABNORMAL HIGH (ref 0.61–1.24)
GFR, Estimated: 29 mL/min — ABNORMAL LOW (ref >60)
Glucose, Bld: 113 mg/dL — ABNORMAL HIGH (ref 70–99)
Potassium: 4 mmol/L (ref 3.5–5.1)
Sodium: 139 mmol/L (ref 135–145)

## 2023-07-11 LAB — CBC
HCT: 23.7 % — ABNORMAL LOW (ref 39.0–52.0)
Hemoglobin: 7.2 g/dL — ABNORMAL LOW (ref 13.0–17.0)
MCH: 23.8 pg — ABNORMAL LOW (ref 26.0–34.0)
MCHC: 30.4 g/dL (ref 30.0–36.0)
MCV: 78.5 fL — ABNORMAL LOW (ref 80.0–100.0)
Platelets: 382 10*3/uL (ref 150–400)
RBC: 3.02 MIL/uL — ABNORMAL LOW (ref 4.22–5.81)
RDW: 18.6 % — ABNORMAL HIGH (ref 11.5–15.5)
WBC: 12.7 10*3/uL — ABNORMAL HIGH (ref 4.0–10.5)
nRBC: 0 % (ref 0.0–0.2)

## 2023-07-11 LAB — HEPARIN LEVEL (UNFRACTIONATED)
Heparin Unfractionated: 0.28 [IU]/mL — ABNORMAL LOW (ref 0.30–0.70)
Heparin Unfractionated: 0.36 [IU]/mL (ref 0.30–0.70)

## 2023-07-11 MED ORDER — HYDRALAZINE HCL 25 MG PO TABS
25.0000 mg | ORAL_TABLET | Freq: Three times a day (TID) | ORAL | Status: DC
Start: 2023-07-11 — End: 2023-07-12
  Administered 2023-07-11 – 2023-07-12 (×4): 25 mg via ORAL
  Filled 2023-07-11 (×4): qty 1

## 2023-07-11 MED ORDER — LINEZOLID 600 MG PO TABS
600.0000 mg | ORAL_TABLET | Freq: Two times a day (BID) | ORAL | Status: DC
  Administered 2023-07-11: 600 mg via ORAL
  Filled 2023-07-11 (×2): qty 1

## 2023-07-11 MED ORDER — AMIODARONE HCL 200 MG PO TABS
200.0000 mg | ORAL_TABLET | Freq: Two times a day (BID) | ORAL | Status: DC
  Administered 2023-07-11 – 2023-07-19 (×18): 200 mg via ORAL
  Filled 2023-07-11 (×18): qty 1

## 2023-07-11 MED ORDER — SODIUM CHLORIDE 0.9% IV SOLUTION: Freq: Once | INTRAVENOUS | Status: AC

## 2023-07-11 NOTE — Progress Notes (Signed)
 Palliative Medicine Inpatient Follow Up Note HPI: 73 y/o male with h/o CHF, CKD stage IV, Atrial Fibrillation, BPH, Anemia, carotid artery disease s/p endarterectomy and stenting, chornic rhinitis, HLD and RLS who went to his neighbors house today after having SOB for at least one week and had a cardiac arrest. The Palliative care team has been asked to support additional goals of care conversations.   Today's Discussion 07/11/2023  *Please note that this is a verbal dictation therefore any spelling or grammatical errors are due to the "Dragon [employer] One" system interpretation.  Chart reviewed inclusive of vital signs, progress notes, laboratory results, and diagnostic images.   I met with Billy [name] at bedside this late afternoon. We discussed Billy [name]'s present fatigue in the setting of having recently worked with the PT team.   I spoke to Billy [name] further about resuscitation status in the setting of all that he has going on presently. He and I discussed the advantages and disadvantages of going through such an event. Billy [name] notes that he, "should have died years ago."   Billy [name] goes on to share that he saw his father pass at a local facility and felt that he suffered as the "fluid in his lungs built up". He thinks that this was under hospice care. I provided education to him about the death rattle and the goal of hospice is to remedy any symptoms someone experiences so they do not feel that they are under water.   Created space and opportunity for patient to explore thoughts feelings and fears regarding current medical situation. He shares fear that when it is his time he does not want to experience what his father did.   Billy [name] shares that he will need more time to consider resuscitation status. He is agreeable to speaking more about this tomorrow.   Billy [name]'s son did not come in today. Billy [name] suspects it is because he is getting ready for his second child.   Questions and concerns  addressed/Palliative Support Provided.   Objective Assessment: Vital Signs Vitals:   07/11/23 1030 07/11/23 1125  BP: (!) 126/50   Pulse:  76  Resp:  17  Temp:  98 F (36.7 C)  SpO2:  94%    Intake/Output Summary (Last 24 hours) at 07/11/2023 1434 Last data filed at 07/11/2023 1100 Gross per 24 hour  Intake 1707.1 ml  Output 2789 ml  Net -1081.9 ml   Last Weight  Most recent update: 07/11/2023  5:02 AM    Weight  (P)  82.4 kg (181 lb 10.5 oz)            Gen:  Elderly Caucasian M in NAD HEENT: moist mucous membranes CV: Regular rate and rhythm  PULM:  On RA, breathing is even and nonlabored ABD: soft/nontender  EXT: No edema  Neuro: Alert and oriented x3   SUMMARY OF RECOMMENDATIONS   Full code --> Billy [name] plans to consider options moving forward    Patients son Billy [name] will bring in a copy of living will  Provided education on the dying process and symptom management through palliative entities   Ongoing PMT support  Billing based on MDM: High ______________________________________________________________________________________ Billy [name] [place] Palliative Medicine Team Team Cell Phone: [phone] Please utilize secure chat with additional questions, if there is no response within 30 minutes please call the above phone number  Palliative Medicine Team providers are available by phone from 7am to 7pm daily and can be reached through the team cell phone.  Should this patient  require assistance outside of these hours, please call the patient's attending physician.

## 2023-07-11 NOTE — Progress Notes (Addendum)
 Patient ID: Billy [name], male   DOB: 12/18/1950, 73 y.o.   MRN: [id number]     Advanced Heart Failure Rounding Note  Cardiologist: Nanetta Batty, MD   Chief Complaint: RV failure  Subjective:    Abx restarted 3/23 d/t rising WBC and CO-OX. AF. WBCs 18>16.8>12.7K.   305 cc out from R chest tube last 24 hrs  CO-OX 92% today. CVP 5. JVD low with some TR waves. Started on 60 IV lasix BID yesterday. SCr climbing.   NSR 80s on IV amio  Feels ok just tired this morning. Hgb 7.2 today. Denies abnormal bleeding.   Events  -  3/10 admitted with OOH cardiac arrest. 20 min CPR. Shocked for VF - In/out AF -> IV amio - Echo EF 45% with RV dysfunction -> suspect PE. No CT with AKI - U/s upper and lower extremities with DVTs - V/Q 3/17 with multiple perfusion defects consistent with CXR findings. ->  IVC filter placed due to GIB - EGD (3/15) showed bleeding friable duodenal mucosa treated with hemostatic spray. No overt GI bleeding today, hgb 8.9.  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 Mild PAH with severe RV failure though cardiac output is preserved. Hep gtt restarted post-cath - Recurrent GIB 3/20. CT angio of abdomen showed no active bleeding. + large bilateral effusion - Bilateral CTs placed 3/20 (transudative)  - L chest tube removed 3/23 Objective:   Weight Range: (P) 82.4 kg Body mass index is 23.97 kg/m (pended).   Vital Signs: Temp:  [97.1 F (36.2 C)-98 F (36.7 C)] 97.9 F (36.6 C) (03/25 0743) Pulse Rate:  [60-97] 97 (03/25 0743) Resp:  [17-26] 19 (03/25 0743) BP: (106-145)/(41-59) 130/55 (03/25 0743) SpO2:  [90 %-97 %] 94 % (03/25 0743) Weight:  [82.4 kg] (P) 82.4 kg (03/25 0500) Last BM Date : 07/09/23  Weight change: Filed Weights   07/09/23 0524 07/10/23 0515 07/11/23 0500  Weight: 87.7 kg 85.6 kg (P) 82.4 kg   Intake/Output:  Intake/Output Summary (Last 24 hours) at 07/11/2023 0825 Last data  filed at 07/11/2023 0816 Gross per 24 hour  Intake 1651.73 ml  Output 2920 ml  Net -1268.27 ml    Physical Exam  General:  elderly appearing.  No respiratory difficulty Neck: supple. JVD 5 with TR waves ~8 cm. LSC CVC Cor: PMI nondisplaced. Regular rate & rhythm. No rubs, gallops or murmurs. Lungs: clear, diminished bases. R CT Extremities: no cyanosis, clubbing, rash, +1 BLE edema. +2 RUE edema Neuro: alert & oriented x 3. Moves all 4 extremities w/o difficulty. Affect pleasant.  Telemetry   NSR 80s (Personally reviewed)    Labs    CBC Recent Labs    07/10/23 0400 07/11/23 0555  WBC 16.8* 12.7*  HGB 7.5* 7.2*  HCT 24.8* 23.7*  MCV 80.0 78.5*  PLT 402* 382   Basic Metabolic Panel Recent Labs    40/98/11 0221 07/10/23 0400 07/11/23 0555  NA 141 139 139  K 4.8 4.3 4.0  CL 109 105 105  CO2 24 27 28   GLUCOSE 138* 108* 113*  BUN 72* 67* 65*  CREATININE 2.17* 2.29* 2.38*  CALCIUM 8.4* 7.8* 7.9*  MG 2.4  --   --    Liver Function Tests No results for input(s): "AST", "ALT", "ALKPHOS", "BILITOT", "PROT", "ALBUMIN" in the last 72 hours.   No results for input(s): "LIPASE", "AMYLASE" in the last 72 hours. Cardiac Enzymes No results for input(s): "  CKTOTAL", "CKMB", "CKMBINDEX", "TROPONINI" in the last 72 hours.  BNP: BNP (last 3 results) Recent Labs    03/09/23 1544 03/11/23 0218 06/27/23 1116  BNP 660.9* 491.3* 646.4*   Imaging   No results found.   Medications:   Scheduled Medications:  atorvastatin  80 mg Oral Daily   Chlorhexidine Gluconate Cloth  6 each Topical Daily   ezetimibe  10 mg Oral Daily   feeding supplement  237 mL Oral TID WC & HS   furosemide  60 mg Intravenous BID   lidocaine  1 patch Transdermal Q24H   multivitamin with minerals  1 tablet Oral Daily   pantoprazole  40 mg Oral BID   sertraline  50 mg Oral QHS   sodium chloride flush  10-40 mL Intracatheter Q12H   sodium chloride flush  3 mL Intravenous Q12H   Infusions:   amiodarone 30 mg/hr (07/11/23 0816)   heparin 1,850 Units/hr (07/11/23 0245)   linezolid (ZYVOX) IV 600 mg (07/10/23 2138)   piperacillin-tazobactam (ZOSYN)  IV 3.375 g (07/11/23 0550)    PRN Medications: acetaminophen, docusate sodium, feeding supplement, HYDROmorphone (DILAUDID) injection, influenza vaccine adjuvanted, ondansetron (ZOFRAN) IV, mouth rinse, oxyCODONE, pneumococcal 20-valent conjugate vaccine, polyethylene glycol, sodium chloride flush, sodium chloride flush  Patient Profile   Billy [name] is a 73 yo male with PMH of HTN, HLD, prior DVT, CVA, paroxysmal atrial fibrillation, CKD stage IIIb, carotid artery disease s/p endarterectomy, and chronic diastolic CHF. Admitted following out of hospital witnessed cardiac arrest.   Assessment/Plan  1. Cardiac arrest:  - Witnessed arrest with bystander then EMS CPR.  >20 minutes CPR.  He was shocked for VF.  Had additional 4-5 minutes PEA arrest in ER. ?PE with aspiration given bilateral DVTs.  - suspect due to PE. Cath stable CAD - VQ and LE/UE u/s + DVT  - No chest CT with renal failure  2. Shock, cardiogenic +/- septic component: Initial echo: EF 45%, diffuse HK, severe LVH, normal RV size with mod decreased systolic function, small pericardial effusion, dilated IVC.  Suspicion for PE as above.  PNA on CXR, initial hypotension thought to be due to septic shock.  He is now off pressors.  - Started on Zosyn/Linezolid 03/23 with rising CO-OX, WBCs and productive cough. Resp cultures pending. PCT 0.38. Afebrile.  3. Acute HF with mid range EF: With prominent RV failure. Initial echo: EF 45%, diffuse HK, severe LVH, normal RV size with mod decreased systolic function, small pericardial effusion, dilated IVC. As above, worry for PE with RV failure. Baseline cardiomyopathy with severe LVH raises concern for cardiac amyloidosis.  TEE 03/13: EF 55%, severe LVH with mild RV dysfunction, small IVC.  - L/RHC 07/05/23: 3v CAD with CTO RCA with L->R  collaterals and mod non-obstructive CAD in L system. Mild PAH with severe RV failure though CO is preserved.   - CO-OX 92% today. Back on IV abx. Not on inotrope support. - CVP 5. Given IV lasix yesterday with good response. Weight down 5lbs. Would hold diuretics today. Note albumin 1.8.  - Continue TED hose. - BP elevated today. Restart hydralazine 25 mg TID. Holding spiro with elevated SCr.  - cMRI completed 3/21, not consistent with cardiac amyloid.   4. Venous thromboembolism: Bilateral upper and lower DVTs, mixed acute and chronic.  He is not anticoagulated at home.  With RV failure on echo, strong suspicion for PE.  - s/p infrarenal IVC placement 3/16.   - V/Q 3/17 with multiple perfusion defects consistent  with CXR findings. - No CTA with AKI.  - BUE [place] with SVTs and DVT.  - Tolerating low-dose heparin.   5. CAD: NSTEMI with HS-TnI 3045.  ?Demand ischemia from arrest vs ACS.  He has history of carotid disease. - L/RHC 07/05/23: 3v CAD with CTO RCA with L->R collaterals and moderate non-obstructive CAD in L system. Mild PAH with severe RV failure though CO is preserved.   - Plavix stopped with GI bleeding.  - Atorvastatin 80 daily.   6. Carotid stenosis: h/o CVA.  TCAR in 6/24.  Okay to remain off plavix at this point    7. Persistent atrial fibrillation: He was not on AC at home though has history of prior CVA.  AC stopped due to prior GI bleeds. He went into AF/RVR here, tolerated poorly.  TEE/DCCV on 03/13.   - Maintaining NSR. Switch amio gtt to PO today. Has gotten >10g load. Will start 200 mg BID today.   - Daily BMET - back on low-dose heparin. Following closely with GIB. Denies recurrent bleeding over last 24 hrs though Hgb continues to drop.   8. AKI on CKD 3b: Suspect ATN in setting of hypotension/arrest.  Baseline Scr seems to be around 2.1. Creatinine 2.55 >>>1.71>>2.3 today (had contrast for CT 03/20) - follow BMP closely  - hold spiro with elevated renal function  9.  ID: PCT 8.37 on 03/11, suspect PNA.   - He has completed course of ceftriaxone/doxycycline.  - Abx restarted 03/23 as above. Concern for aspiration PNA.Resp culture pending.  10. GI bleeding: Hematochezia with AC.  Had this in the past with [facility] as well.  GI following.  Has had transfusion here. EGD showed bleeding friable duodenal mucosa treated with hemostatic spray.  Had recurrent bleeding 3/20. Heparin held. CT angio of abdomen showed no active bleeding. - back on heparin. - Continue PPI - monitor H/H. Hgb 7.2 today. Denies abnormal bleeding - transfuse < 7  12. Pleural Effusions - s/p CT placement per CCM 3/20  - transudative - Left chest tube removed 3/23 - Right CT still draining  13. Severe deconditioning - PT/OT - Not a candidate for CIR as family unable to help once he's home. Plan for SNF at discharge.   14. Severe protein-calorie malnutrition - Dietician consulted  15. GOC - multiple markers of poor prognosis - Palliative care consulted. - plan for SNF at discharge   Alen Bleacher AGACNP-BC  07/11/2023 8:25 AM  Patient seen and examined with the above-signed Advanced Practice Provider and/or Housestaff. I personally reviewed laboratory data, imaging studies and relevant notes. I independently examined the patient and formulated the important aspects of the plan. I have edited the note to reflect any of my changes or salient points. I have personally discussed the plan with the patient and/or family.  Remains weak. Diuresed well with IV lasix SCr stable in 2.3 range. Co-ox high. Remains on abx. Maintaining NSR on IV amio. No significant bleeding on low-dose heparin. Hgb 7.2. Right chest tube.   General:  Weak appearing. No resp difficulty HEENT: normal Neck: supple. no JVD. Carotids 2+ bilat; no bruits. No lymphadenopathy or thryomegaly appreciated. Cor: Regular rate & rhythm. 2/6 TR Lungs: Decreased at bases + right CT Abdomen: soft, nontender, nondistended. No  hepatosplenomegaly. No bruits or masses. Good bowel sounds. Extremities: no cyanosis, clubbing, rash, 2+ edema Neuro: alert & orientedx3, cranial nerves grossly intact. moves all 4 extremities w/o difficulty. Affect pleasant  Continues to struggle with MSOF but  looks a bit better today. Agree with holding lasix today. Switch amio to po. Continue heparin. (May be able to switch to Eliquis soon). Will check with Pulmoanry re management of CT.   Continue PT/OT  Palliative Care meeting today.   Arvilla Meres, MD  11:05 AM
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
## 2023-07-11 NOTE — Progress Notes (Signed)
 Progress Note   Patient: Billy [name] QMV:[id number] DOB: Nov 25, 1950 DOA: 06/26/2023     15 DOS: the patient was seen and examined on 07/11/2023   Brief hospital course: 73 y/o male with h/o CHF, CKD stage IV, Atrial Fibrillation, BPH, Anemia, carotid artery disease s/p endarterectomy and stenting, chornic rhinitis, HLD and RLS who went to his neighbors house after having SOB for at least 1 week and had a cardiac arrest. Neighbor stated CPR immediately and EMS continued . He got ROSC back after about 15 min. Then in the ED he arrested again for about 2-5 min and then achieved ROSC. Patient was admitted to the ICU.   Patient found to have cardiogenic shock and required vasopressors.  Echocardiogram with mild reduction in LV systolic function. Positive bilateral deep vein thrombosis.    03/13 direct current cardioversion for atrial fibrillation. Patient noted to have hematochezia with acute blood loss anemia.   03/15 EGD duodenitis.  03/16 IVC filter placement.  03/17 resume IV heparin for anticoagulation. 03/18 completed antibiotic therapy for pneumonia.  03/20 recurrent lower gastrointestinal bleeding while on IV heparin. CT abdomen and pelvis with no active signs of bleeding. Positive bilateral pleural effusions, placed bilateral chest tubes.  03/21 no recurrent bleeding, hgb has been stable, 8,3 to 7.7, resumed heparin drip for anticoagulation.  03/22 bowel movement with dark brown red color, no low dose heparin.  03/ 23 left chest tube removed today. Positive right upper lobe infiltrate, consistent with aspiration pneumonia, started on antibiotic therapy.  03/24 continue very weak and deconditioned, but hemodynamically stable.  03/25 transfusing 1 unit PRBC today.   Assessment and Plan: * Acute on chronic diastolic CHF (congestive heart failure) (HCC) Echocardiogram with preserved LV systolic function with EF 55%, severe LVH, interventricular septum is flattened in systole and diastole, RV  systolic function with severe reduction, RA size with mild enlargement, small pericardial effusion.   03/19 cardiac catheterization  3 vessel coronary artery disease with CTO RCA with L > R collaterals and moderate non obstructive coronary artery disease in the left system.  RA 9  RV 24/16 PA 40/31 mean 30  PCWP 14  Cardiac output 7,4 and index 3,5 Fick, 7.3 and 3.5 thermodilution.  PVR 2.2 WU   Acute on chronic core pulmonale Pulmonary hypertension  RV failure   Urine output is 2,455  ml. Systolic blood pressure 130 mmHg range.   Plan to continue hydralazine. Holding diuretic therapy for now.  Recovered cardiac arrest, cardiogenic shock. (Resolved) 03/21 Cardiac MRI not consistent with cardiac amyloid.   Acute hypoxemic respiratory failure due to cardiogenic pulmonary edema.  Oxygenation has improved, oxygenation is 94% on room air at rest.   Bilateral pleural effusions, sp bilateral chest tubes placement.  Pleural fluid consistent with exudate per Light's criteria, high LDH and high protein ratio. 03/23 Left chest tube has been removed  Right chest tube reported 305 cc drain.   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 Acute deep vein thrombosis involving the left proximal profunda vein and left proximal femoral vein.  Indeterminate deep vein thrombosis involving the left popliteal vein, and left middle and distal femoral vein.   Suspected acute pulmonary embolism. (High pre test probability)  03/17 V/Q scan with multiple perfusion defects which are matches in size to the corresponding x ray findings, compatible with non diagnostic exam (low to intermediate probability).   SP IV filter.  Anticoagulation with IV heparin.  Gastrointestinal hemorrhage with melena 03/15 Endoscopy with erosive gastropathy with no bleeding and no stigmata of recent bleeding.  Bleeding friable duodenal mucosa. Hemostatic  spry applied.   Acute blood loss anemia due to upper and possible lower GI  bleeding.   No further evidence of active bleeding, clinically and per CT.  Follow up hgb is 7.2.    Plan to continue pantoprazole Low dose heparin infusion.   CKD stage 3b, GFR 30-44 ml/min (HCC) AKI, hypernatremia.   Worsening renal function with serum cr at 2,38 with K at 4,0 and serum bicarbonate at 28  Na 139   Plan to continue close monitoring renal function and electrolytes.  Holding diuretic therapy for now.   Aspiration pneumonia of right upper lobe (HCC) New right  upper lobe infiltrate, consistent with aspiration pneumonia.   Trach culture with rare gram positive cocci in pairs and clusters, no staph aureus or pseudomonas. Wbc is down to 12.7  and he has been afebrile.   Will discontinue linezolid and continue Zosyn to complete 5 days. Continue close monitoring cell count and temperature curve.  03/25 chest radiograph with small bilateral pleural effusions and interstitial right upper lobe infiltrate (not worsening).   PAF (paroxysmal atrial fibrillation) (HCC) Sp direct current cardioversion, patient in and out atrial fibrillation.  Currently on sinus rhythm.   Plan to continue with amiodarone and anticoagulation with iV heparin.   Hypertension Continue blood pressure control with hydralazine.   Hyperlipidemia Continue statin therapy.        Subjective: Patient with no chest pain, continue very weak and deconditioned, no nausea or vomiting. No hematochezia or melena. Positive dyspnea on exertion.   Physical Exam: Vitals:   07/11/23 0743 07/11/23 1030 07/11/23 1125 07/11/23 1430  BP: (!) 130/55 (!) 126/50  (!) 137/51  Pulse: 97  76 64  Resp: 19  17 18   Temp: 97.9 F (36.6 C)  98 F (36.7 C) 97.6 F (36.4 C)  TempSrc: Oral  Oral Oral  SpO2: 94%  94% 96%  Weight:      Height:       Neurology awake and alert ENT with positive pallor with no icterus Cardiovascular with S1  and S2 present and regular with no gallops or rubs, positive systolic murmur at the apex No JVD Lower extremity edema pitting +  Respiratory with scattered rales with no wheezing or rhonchi, poor inspiratory effort,. Right chest tube is in place Abdomen with no distention  Data Reviewed:    Family Communication: no family at the bedside   Disposition: Status is: Inpatient Remains inpatient appropriate because: recovering from cardiac arrest   Planned Discharge Destination: to be determined.       Author: Coralie Keens, MD 07/11/2023 2:47 PM  For on call review [email or web address].
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
## 2023-07-11 NOTE — TOC Progression Note (Signed)
 Transition of Care [facility]) - Progression Note    Patient Details  Name: Billy [name] MRN: [id number] Date of Birth: 10/06/1950  Transition of Care [facility]) CM/SW Contact  Nicanor Bake Phone Number: [phone] 07/11/2023, 11:21 AM  Clinical Narrative: 9:51 AM- HF CSW called and spoke with the patients son, [name] over the phone. [name] stated that his half sister Sue Lush has moved back to [place]. [name] stated that he has a 6 month old and a new born Due April 12th. [name] stated that right now SNF would be most appropriate. [name] gave CSW permission to fax the patient out and requested for accepted SNF bed offers to be left at bedside.     CSW completed the patients FL2 and faxed out.   TOC will continue following.     Expected Discharge Plan: IP Rehab Facility Barriers to Discharge: Continued Medical Work up  Expected Discharge Plan and Services   Discharge Planning Services: CM Consult Post Acute Care Choice: IP Rehab Living arrangements for the past 2 months: Single Family Home                                       Social Determinants of Health (SDOH) Interventions SDOH Screenings   Food Insecurity: No Food Insecurity (06/27/2023)  Housing: Low Risk  (06/27/2023)  Transportation Needs: No Transportation Needs (06/27/2023)  Utilities: Not At Risk (06/27/2023)  Depression (PHQ2-9): Low Risk  (12/01/2020)  Social Connections: Socially Isolated (06/27/2023)  Tobacco Use: Low Risk  (07/01/2023)    Readmission Risk Interventions    06/27/2023    1:52 PM 10/14/2022   11:29 AM  Readmission Risk Prevention Plan  Post Dischage Appt  Complete  Medication Screening  Complete  Transportation Screening Complete Complete  HRI or Home Care Consult Complete   Social Work Consult for Recovery Care Planning/Counseling Complete   Palliative Care Screening Not Applicable   Medication Review Oceanographer) Referral to Pharmacy

## 2023-07-11 NOTE — NC FL2 (Signed)
 [place] MEDICAID FL2 LEVEL OF CARE FORM     IDENTIFICATION  Patient Name: Billy [name] Birthdate: [birth year] Sex: male Admission Date (Current Location): 06/26/2023  [facility] and [place] Number:  Producer, television/film/video and Address:  [facility]. [facility], [address] [place], [place] [address]      Provider Number: [id number]  Attending Physician Name and Address:  Coralie Keens,*  Relative Name and Phone Number:  Maahir, Horst   [phone]    Current Level of Care: Hospital Recommended Level of Care: Skilled Nursing Facility Prior Approval Number:    Date Approved/Denied:   PASRR Number: [id number]  Discharge Plan: SNF    Current Diagnoses: Patient Active Problem List   Diagnosis Date Noted   Lobar pneumonia (HCC) 07/10/2023   Need for management of chest tube 07/10/2023   Pleural effusion on right 07/10/2023   Aspiration pneumonia of right upper lobe (HCC) 07/09/2023   Pleural effusion 07/06/2023   Cardiac arrest (HCC) 06/26/2023   Gastritis 03/12/2023   Acute on chronic diastolic CHF (congestive heart failure) (HCC) 03/10/2023   Carotid artery stenosis 10/13/2022   Carotid artery stenosis, asymptomatic, right 10/13/2022   Asymptomatic carotid artery stenosis, left 07/28/2022   Biceps tendonitis on right    Tear of right supraspinatus tendon    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    PAF (paroxysmal atrial fibrillation) (HCC) 09/13/2018   Upper GI bleeding 07/27/2018   History of non-ST elevation myocardial infarction (NSTEMI) 06/15/2018   AKI (acute kidney injury) (HCC) 04/11/2018   Gastrointestinal hemorrhage with melena 04/11/2018   Acute blood loss anemia 04/11/2018   Metabolic acidosis, normal anion gap (NAG) 04/11/2018   Carotid artery disease (HCC) 01/17/2018   Hyperlipidemia 01/17/2018   Peripheral neuropathy 12/25/2017   Hypertension 12/12/2017   Transient neurologic  deficit 12/12/2017   History of CVA (cerebrovascular accident) 12/12/2017   Transient ischemic attack    Hypertensive 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 CHF 04/20/2017   Acute DVT (deep venous thrombosis) (HCC) 04/20/2017   Hematuria 04/20/2017   CKD stage 3b, GFR 30-44 ml/min (HCC) 04/20/2017    Orientation RESPIRATION BLADDER Height & Weight     Self, Time, Situation, Place  Normal Continent Weight: (P) 181 lb 10.5 oz (82.4 kg) Height:  6\' 1"  (185.4 cm)  BEHAVIORAL SYMPTOMS/MOOD NEUROLOGICAL BOWEL NUTRITION STATUS      Continent Diet (See dc summary)  AMBULATORY STATUS COMMUNICATION OF NEEDS Skin   Limited Assist Verbally PU Stage and Appropriate Care (Wound/Incision  07/03/23 Non-pressure wound Arm Lower;Posterior;Right ruptured blister noted to right lower arm/wrist;Incision  07/02/23 Neck Right;Wound / Incision  07/02/23 Knee Left;Wound / Incision  06/28/23 Medical Adhesive-Related Skin Injury Chest)                       Personal Care Assistance Level of Assistance  Bathing, Feeding, Dressing, Total care Bathing Assistance: Limited assistance Feeding assistance: Limited assistance Dressing Assistance: Limited assistance Total Care Assistance: Limited assistance   Functional Limitations Info  Sight, Hearing, Speech Sight Info: Impaired Hearing Info: Adequate Speech Info: Adequate    SPECIAL CARE FACTORS FREQUENCY  PT (By licensed PT), OT (By licensed OT)     PT Frequency: 5x weekly OT Frequency: 5x weekly            Contractures      Additional Factors Info  Code Status, Allergies Code Status Info: Full Allergies Info: Amlodipine;Morphine And Codeine  Current Medications (07/11/2023):  This is the current hospital active medication list Current Facility-Administered Medications  Medication Dose Route Frequency Provider Last Rate Last Admin   acetaminophen (TYLENOL) tablet 650 mg  650 mg Oral Q4H PRN Bensimhon, Bevelyn Buckles, MD   650 mg at 07/05/23 2156    amiodarone (PACERONE) tablet 200 mg  200 mg Oral BID Alen Bleacher, NP       atorvastatin (LIPITOR) tablet 80 mg  80 mg Oral Daily Bensimhon, Bevelyn Buckles, MD   80 mg at 07/10/23 1012   Chlorhexidine Gluconate Cloth 2 % PADS 6 each  6 each Topical Daily Bensimhon, Bevelyn Buckles, MD   6 each at 07/10/23 1016   docusate sodium (COLACE) capsule 100 mg  100 mg Oral BID PRN Bensimhon, Bevelyn Buckles, MD       ezetimibe (ZETIA) tablet 10 mg  10 mg Oral Daily Bensimhon, Bevelyn Buckles, MD   10 mg at 07/10/23 1012   feeding supplement (ENSURE ENLIVE / ENSURE PLUS) liquid 237 mL  237 mL Oral TID WC & HS Arrien, York Ram, MD   237 mL at 07/11/23 0817   feeding supplement (ENSURE ENLIVE / ENSURE PLUS) liquid 237 mL  237 mL Oral TID BM PRN Arrien, York Ram, MD       heparin ADULT infusion 100 units/mL (25000 units/217mL)  1,850 Units/hr Intravenous Continuous Stevphen Rochester, RPH 18.5 mL/hr at 07/11/23 1000 1,850 Units/hr at 07/11/23 1000   hydrALAZINE (APRESOLINE) tablet 25 mg  25 mg Oral Q8H Brynda Peon L, NP       HYDROmorphone (DILAUDID) injection 0.5 mg  0.5 mg Intravenous Q4H PRN Karie Fetch P, DO   0.5 mg at 07/11/23 0865   influenza vaccine adjuvanted (FLUAD) injection 0.5 mL  0.5 mL Intramuscular Prior to discharge Bensimhon, Bevelyn Buckles, MD       lidocaine (LIDODERM) 5 % 1 patch  1 patch Transdermal Q24H Bensimhon, Bevelyn Buckles, MD   1 patch at 07/11/23 0242   linezolid (ZYVOX) tablet 600 mg  600 mg Oral Q12H Cristopher Peru, PA-C       multivitamin with minerals tablet 1 tablet  1 tablet Oral Daily Arrien, York Ram, MD   1 tablet at 07/10/23 1012   ondansetron [facility]) injection 4 mg  4 mg Intravenous Q6H PRN Bensimhon, Bevelyn Buckles, MD   4 mg at 07/08/23 0827   Oral care mouth rinse  15 mL Mouth Rinse PRN Bensimhon, Bevelyn Buckles, MD       oxyCODONE (Oxy IR/ROXICODONE) immediate release tablet 5 mg  5 mg Oral Q4H PRN Steffanie Dunn, DO   5 mg at 07/10/23 2332   pantoprazole (PROTONIX) EC tablet 40 mg  40 mg Oral  BID Mosetta Anis, RPH   40 mg at 07/10/23 2131   piperacillin-tazobactam (ZOSYN) IVPB 3.375 g  3.375 g Intravenous Q8H Cristopher Peru, PA-C   Stopped at 07/11/23 7846   pneumococcal 20-valent conjugate vaccine (PREVNAR 20) injection 0.5 mL  0.5 mL Intramuscular Prior to discharge Bensimhon, Bevelyn Buckles, MD       polyethylene glycol (MIRALAX / GLYCOLAX) packet 17 g  17 g Oral Daily PRN Bensimhon, Bevelyn Buckles, MD       sertraline (ZOLOFT) tablet 50 mg  50 mg Oral QHS Bensimhon, Bevelyn Buckles, MD   50 mg at 07/10/23 2131   sodium chloride flush (NS) 0.9 % injection 10-40 mL  10-40 mL Intracatheter Q12H Bensimhon, Bevelyn Buckles, MD   40  mL at 07/10/23 2132   sodium chloride flush (NS) 0.9 % injection 10-40 mL  10-40 mL Intracatheter PRN Bensimhon, Bevelyn Buckles, MD       sodium chloride flush (NS) 0.9 % injection 3 mL  3 mL Intravenous Q12H Bensimhon, Bevelyn Buckles, MD   3 mL at 07/10/23 2132   sodium chloride flush (NS) 0.9 % injection 3 mL  3 mL Intravenous PRN Bensimhon, Bevelyn Buckles, MD         Discharge Medications: Please see discharge summary for a list of discharge medications.  Relevant Imaging Results:  Relevant Lab Results:   Additional Information SSN: [id number]  Reva Bores, LCSWA

## 2023-07-11 NOTE — Progress Notes (Signed)
 Nutrition Follow-up  DOCUMENTATION CODES:   Not applicable  INTERVENTION:   Pt eating minimally from meal trays, on Regular diet. Pt reports drinking 2-3 Ensure per day. Pt currently not meeting nutritional needs by mouth.  Encourage small frequent meals. Add pudding to meal trays as this is the one thing pt mentioned he may be able to eat. Snacks available  Continue Ensure Enlive po TID with meals AND at bedtime, each supplement provides 350 kcal and 20 grams of protein. Continue additional Ensure prn as requested, as tolerated (pt would need to consume 6 Ensure supplements per day to meet nutritional needs if not eating anything else)  D/C Magic Cup as pt not eating  Continue MVI with Minerals  Recommend considering addition of Remeron (if no contraindications) as both an appetite stimulant and for mood  NUTRITION DIAGNOSIS:   Inadequate oral intake related to acute illness as evidenced by meal completion < 25%.  Continues  GOAL:   Patient will meet greater than or equal to 90% of their needs  Not Met but being addressed  MONITOR:   PO intake, Supplement acceptance, Labs, Weight trends  REASON FOR ASSESSMENT:   Consult Assessment of nutrition requirement/status, Poor PO  ASSESSMENT:   Pt admitted with cardiac arrest and cardiogenic shock. PMH significant for CKD stage IV, afib, carotid artery disease s/p endarterectomy.  3/10: admitted post PEA arrest, intubated 3/12: extubated 3/14: BRBPR 3/15: EGD- duodenitis with bleeding s/p hemostasis with purastat gel 3/20: CT with bilateral effusion 3/23: Chest tube removed  Noted palliative care consulted, pt remains Full Code but GOC discussions ongoing. Noted CIR evaluated but family interested in possible SNF.  Pt sitting in room with all the lights off and shades pulled on visit today. Pt affect appears flat. Pt does not have an appetite. Recorded po intake essentially 0-25% but mostly 0-15% with one meal documented  at 75%   Calorie Count ordered Friday but on visit today no calorie count information found; of note, pt did move from 2H to 3E during this time frame and it is very possible the calorie count documentation did not transfer with him.   Pt reports he ate bacon at breakfast this AM, tried to eat the eggs but could not. Lunch tray at bedside but untouched. When RD asked it pt planned to eat some lunch, pt shrugged and stated he had not even looked at lunch tray to see what was sent.   Noted Cortrak previously recommended by Inpatient RD but decision made to not place at that time. Pt is not meeting nutritional needs but will await further decisions regarding GOC as this would impact how aggressive team should be with regards to nutrition intervention  Weight has fluctuated up and down since admission  Noted pt with BRBPR on and off throughout admission, pt denies bleeding today. Noted GI signed off. Hgb 7.2 today  Labs: BUN 65 Creatinine 2.38 Sodium 139 (wdl) Potassium 4.0 (wdl) Hgb 7.2  Meds:  Zoloft Protonix MVI with Minerals  NUTRITION - FOCUSED PHYSICAL EXAM:  Deferred but need to attempt on follow-up  Diet Order:   Diet Order             Diet regular Room service appropriate? Yes with Assist; Fluid consistency: Thin  Diet effective now                   EDUCATION NEEDS:   No education needs have been identified at this time  Skin:  Skin Assessment:  Skin Integrity Issues: Skin Integrity Issues:: Other (Comment) Other: L arm laceration; MARSI chest; non-pressure wound R arm; incision R neck  Last BM:  3/20 type 7 x2  Height:   Ht Readings from Last 1 Encounters:  06/27/23 6\' 1"  (1.854 m)    Weight:   Wt Readings from Last 1 Encounters:  07/11/23 (P) 82.4 kg    Ideal Body Weight:     BMI:  Body mass index is 23.97 kg/m (pended).  Estimated Nutritional Needs:   Kcal:  2200-2400  Protein:  120-135g  Fluid:  >/=2L   Romelle Starcher MS, RDN, LDN,  CNSC Registered Dietitian 3 Clinical Nutrition RD Inpatient Contact Info in Amion

## 2023-07-11 NOTE — Progress Notes (Signed)
 NAME:  Billy [name], MRN:  [id number], DOB:  [birth year], LOS: 15 ADMISSION DATE:  07-15-2023, CONSULTATION DATE:  2023/07/15 REFERRING MD:  ED Physician, CHIEF COMPLAINT:  Cardiac Arrest   History of Present Illness:  73 y/o male with h/o CHF, CKD stage IV, Atrial Fibrillation, BPH, Anemia, carotid artery disease s/p endarterectomy and stenting, chornic rhinitis, HLD and RLS who went to his neighbors house today after having SOB for at least 1 week and had a cardiac arrest.  Neighbor stated CPR immediately and EMS continued .  He got ROSC back after about 15 min.  Then in the ED he arrested again for about 2-5 min and then achieved ROSC.  In the ED he was given 1 Epi and had chest compressions.   In the ED he was seen by Cardiology for possible STEMI, but felt as though he was not a a STEMI.  Pertinent  Medical History  Chronic kidney disease, stage 4 (severe)  Atherosclerosis of renal artery Unspecified atrial fibrillation  Personal history of other venous thrombosis and embolism Chronic systolic heart failure  Significant Hospital Events: Including procedures, antibiotic start and stop dates in addition to other pertinent events   07/15/23: Post code in field, coded x 3 min in ED, CPR and 1 epinephrine given to achieve ROSC, intubated 3/11: following commands  3/12: extubated on [facility] 3/13:  Con't on 5LNC. He says he feels sob with laying flat.  Underwent TEE which was negative for LAA thrombus therefore patient was cardioverted to NSR complications 3/14 No acute issues overnight, remains in NSR; developed BRBPR; heparin stopped 3/15 EGD: duodenitis, ongoing BRBPR but H/H stable 3/16 for IVC filter 3/20 Bilat effusions seen on CT abd/pelvis, PCCM consult, bilat chest tubes placed  3/24: left CT out yesterday, right remains in place.   Interim History / Subjective:  No new events. Patient comfortable.   Objective   Blood pressure (!) 128/55, pulse 65, temperature 98 F (36.7 C), temperature  source Oral, resp. rate 18, height 6\' 1"  (1.854 m), weight (P) 82.4 kg, SpO2 96%. CVP:  [2 mmHg-8 mmHg] 3 mmHg      Intake/Output Summary (Last 24 hours) at 07/11/2023 1754 Last data filed at 07/11/2023 1636 Gross per 24 hour  Intake 1233.01 ml  Output 3179 ml  Net -1945.99 ml   Filed Weights   07/09/23 0524 07/10/23 0515 07/11/23 0500  Weight: 87.7 kg 85.6 kg (P) 82.4 kg    Physical exam: General: older male, chronically ill appearing, sitting in bed, no acute distress  HEENT: [place]/AT, anicteric sclera, mucous membranes moist, nasal cannula  Neuro: somewhat drowsy but wakes easily to voice and answers appropriately, no focal findings, very weak globally  Chest: equal chest rise and fall, productive cough, nasal cannula, no wheezing, no respiratory distress. Right chest tube draining of serous fluid Heart: s1s2, rrr, no m/r/g Abdomen: rounded, soft, ntnd Skin: warm, dry, intact   Right-sided chest tube output 120 cc in last 12 hours  Assessment & Plan:  Bilateral pleural effusions with septations status post bilateral chest tube placement Acute respiratory failure with hypoxia Concern for RUL pneumonia with ongoing leukocytosis, very high coox, thick sputum, ongoing oxygen requirement Pleural fluid studies from left side consistent with exudative by LDH criteria, low protein. No pleural studies sent from the right chest. Left CT removed 3/23. Right chest tube with 120cc output/12 hours.  - con't right chest tube to -20cmH2O - may be ready for removal tomorrow - con't zosyn, linezolid  for 7 days total (ending 3/29) - f/u sputum culture data   Acute on chronic HFrEF, ICM Mild PAH, severe RV failure Moderate TR Cardiac arrest PAD s/p CEA - GDMT limited by renal failure - appreciate AHF's management - recommend stopping lasix today, CVP 2 and BP soft, worsening renal function  - dc hydralazine   L basilic vein, L cephalic vein SVT LLE DVT, RLE RVT - con't heparin   AKI  on CKD 4 - trend bmp, mag, phos - replete elytes - strict I&O - Avoid nephrotoxic agents, renally dose medications - ensure adequate renal perfusion   Deconditioning, debility -PT, OT > needs  -OOB mobility as able  Anemia -transfuse for Hb <7 or hemodynamically significant bleeding  Management per primary team and advanced heart failure team. Would recommend pushing PT as he refused this AM and is very frail and weak.   CC time: na  Lynnell Catalan, MD [facility] 07/11/23 5:54 PM  Please see [email or web address] for pager details.  From 7A-7P if no response, please call [phone] After hours, please call ELink [phone]
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
## 2023-07-11 NOTE — Plan of Care (Signed)
  Problem: Skin Integrity: Goal: Risk for impaired skin integrity will decrease Outcome: Progressing   Problem: Education: Goal: Knowledge of General Education information will improve Description: Including pain rating scale, medication(s)/side effects and non-pharmacologic comfort measures Outcome: Progressing   Problem: Pain Managment: Goal: General experience of comfort will improve and/or be controlled Outcome: Not Progressing: Pt continually asking for pain meds. Pain needs seem to be unmet

## 2023-07-12 ENCOUNTER — Other Ambulatory Visit (HOSPITAL_COMMUNITY): Payer: Self-pay

## 2023-07-12 DIAGNOSIS — R9431 Abnormal electrocardiogram [ECG] [EKG]: Secondary | ICD-10-CM

## 2023-07-12 DIAGNOSIS — T68XXXA Hypothermia, initial encounter: Secondary | ICD-10-CM | POA: Diagnosis not present

## 2023-07-12 DIAGNOSIS — N1832 Chronic kidney disease, stage 3b: Secondary | ICD-10-CM | POA: Diagnosis not present

## 2023-07-12 DIAGNOSIS — Z515 Encounter for palliative care: Secondary | ICD-10-CM | POA: Diagnosis not present

## 2023-07-12 DIAGNOSIS — I469 Cardiac arrest, cause unspecified: Secondary | ICD-10-CM | POA: Diagnosis not present

## 2023-07-12 LAB — BASIC METABOLIC PANEL
Anion gap: 9 (ref 5–15)
BUN: 56 mg/dL — ABNORMAL HIGH (ref 8–23)
CO2: 26 mmol/L (ref 22–32)
Calcium: 8.4 mg/dL — ABNORMAL LOW (ref 8.9–10.3)
Chloride: 104 mmol/L (ref 98–111)
Creatinine, Ser: 2.21 mg/dL — ABNORMAL HIGH (ref 0.61–1.24)
GFR, Estimated: 31 mL/min — ABNORMAL LOW (ref >60)
Glucose, Bld: 132 mg/dL — ABNORMAL HIGH (ref 70–99)
Potassium: 3.8 mmol/L (ref 3.5–5.1)
Sodium: 139 mmol/L (ref 135–145)

## 2023-07-12 LAB — HEPARIN LEVEL (UNFRACTIONATED): Heparin Unfractionated: 0.42 [IU]/mL (ref 0.30–0.70)

## 2023-07-12 LAB — CULTURE, RESPIRATORY W GRAM STAIN

## 2023-07-12 LAB — TYPE AND SCREEN
ABO/RH(D): A POS
Antibody Screen: NEGATIVE
Unit division: 0

## 2023-07-12 LAB — BPAM RBC
Blood Product Expiration Date: 202504142359
ISSUE DATE / TIME: 202503251523
Unit Type and Rh: 6200

## 2023-07-12 LAB — IRON AND TIBC
Iron: 29 ug/dL — ABNORMAL LOW (ref 45–182)
Saturation Ratios: 12 % — ABNORMAL LOW (ref 17.9–39.5)
TIBC: 234 ug/dL — ABNORMAL LOW (ref 250–450)
UIBC: 205 ug/dL

## 2023-07-12 LAB — COOXEMETRY PANEL
Carboxyhemoglobin: 2.9 % — ABNORMAL HIGH (ref 0.5–1.5)
Methemoglobin: <0.7 % (ref 0.0–1.5)
O2 Saturation: 73.3 %
Total hemoglobin: 9.2 g/dL — ABNORMAL LOW (ref 12.0–16.0)

## 2023-07-12 LAB — CBC
HCT: 27.6 % — ABNORMAL LOW (ref 39.0–52.0)
Hemoglobin: 8.7 g/dL — ABNORMAL LOW (ref 13.0–17.0)
MCH: 25.1 pg — ABNORMAL LOW (ref 26.0–34.0)
MCHC: 31.5 g/dL (ref 30.0–36.0)
MCV: 79.8 fL — ABNORMAL LOW (ref 80.0–100.0)
Platelets: 369 10*3/uL (ref 150–400)
RBC: 3.46 MIL/uL — ABNORMAL LOW (ref 4.22–5.81)
RDW: 18.2 % — ABNORMAL HIGH (ref 11.5–15.5)
WBC: 8.9 10*3/uL (ref 4.0–10.5)
nRBC: 0.2 % (ref 0.0–0.2)

## 2023-07-12 LAB — PREPARE RBC (CROSSMATCH)

## 2023-07-12 LAB — FERRITIN: Ferritin: 39 ng/mL (ref 24–336)

## 2023-07-12 MED ORDER — FUROSEMIDE 10 MG/ML IJ SOLN: 60.0000 mg | Freq: Once | INTRAMUSCULAR | Status: DC

## 2023-07-12 MED ORDER — HEPARIN (PORCINE) 25000 UT/250ML-% IV SOLN
1850.0000 [IU]/h | INTRAVENOUS | Status: DC
  Administered 2023-07-12 – 2023-07-13 (×2): 1850 [IU]/h via INTRAVENOUS
  Filled 2023-07-12 (×2): qty 250

## 2023-07-12 MED ORDER — HYDRALAZINE HCL 50 MG PO TABS
50.0000 mg | ORAL_TABLET | Freq: Three times a day (TID) | ORAL | Status: DC
  Administered 2023-07-12 – 2023-07-17 (×15): 50 mg via ORAL
  Filled 2023-07-12 (×15): qty 1

## 2023-07-12 MED ORDER — TORSEMIDE 20 MG PO TABS
40.0000 mg | ORAL_TABLET | Freq: Every day | ORAL | Status: DC
  Administered 2023-07-12 – 2023-07-13 (×2): 40 mg via ORAL
  Filled 2023-07-12 (×2): qty 2

## 2023-07-12 NOTE — Progress Notes (Addendum)
   Palliative Medicine Inpatient Follow Up Note HPI: 73 y/o male with h/o CHF, CKD stage IV, Atrial Fibrillation, BPH, Anemia, carotid artery disease s/p endarterectomy and stenting, chornic rhinitis, HLD and RLS who went to his neighbors house today after having SOB for at least one week and had a cardiac arrest. The Palliative care team has been asked to support additional goals of care conversations.   Today's Discussion 07/12/2023  *Please note that this is a verbal dictation therefore any spelling or grammatical errors are due to the "Dragon Medical One" system interpretation.  Chart reviewed inclusive of vital signs, progress notes, laboratory results, and diagnostic images.   I met with Billy [name] at bedside this morning - he is noted to be sitting up in his chair. He expressed being tired and asking to speak later. He does at that time deny pain, SOB, and nausea.   I went to bedside this early evening though Billy [name] was noted to be resting.  I spoke to Billy [name]'s son, Billy [name] this evening. We discussed patient present clinical condition. Billy [name] is very understanding in regards to how this hospitalization may impact his future and his quality of life he has thereafter. Billy [name] shares that his father will often avoid tough conversations. He notes in addition that his fathers mood has certainly been down in recent years though he anticipate will be even more so post hospitalization. [name] understand the importance of additional conversations with his father regarding goals of care.   I was able to obtain a copy of Billy [name]'s living will.   Questions and concerns addressed/Palliative Support Provided.   Objective Assessment: Vital Signs Vitals:   07/12/23 1414 07/12/23 1656  BP: (!) 164/72 (!) 153/61  Pulse:  80  Resp:  20  Temp:  97.6 F (36.4 C)  SpO2:  96%    Intake/Output Summary (Last 24 hours) at 07/12/2023 1731 Last data filed at 07/12/2023 1400 Gross per 24 hour  Intake 811.31 ml  Output  1660 ml  Net -848.69 ml   Last Weight  Most recent update: 07/12/2023  4:04 AM    Weight  81.4 kg (179 lb 7.3 oz)            Gen:  Elderly Caucasian M in NAD HEENT: moist mucous membranes CV: Regular rate and rhythm  PULM:  On RA, breathing is even and nonlabored ABD: soft/nontender  EXT: No edema  Neuro: Alert and oriented x3   SUMMARY OF RECOMMENDATIONS   Full code --> Billy [name] plans to consider options moving forward    Living will obtained and will be scanned into [employer]  We will continue to hold conversations in the setting of patients co-morbidities   Ongoing PMT support  Billing based on MDM: Moderate  ______________________________________________________________________________________ [name] [place] Palliative Medicine Team Team Cell Phone: [phone] Please utilize secure chat with additional questions, if there is no response within 30 minutes please call the above phone number  Palliative Medicine Team providers are available by phone from 7am to 7pm daily and can be reached through the team cell phone.  Should this patient require assistance outside of these hours, please call the patient's attending physician.

## 2023-07-12 NOTE — Progress Notes (Signed)
 NAME:  Billy [name], MRN:  [id number], DOB:  09-04-1950, LOS: 16 ADMISSION DATE:  07/04/2023, CONSULTATION DATE:  Jul 04, 2023 REFERRING MD:  ED Physician, CHIEF COMPLAINT:  Cardiac Arrest   History of Present Illness:  73 y/o male with h/o CHF, CKD stage IV, Atrial Fibrillation, BPH, Anemia, carotid artery disease s/p endarterectomy and stenting, chornic rhinitis, HLD and RLS who went to his neighbors house today after having SOB for at least 1 week and had a cardiac arrest.  Neighbor stated CPR immediately and EMS continued .  He got ROSC back after about 15 min.  Then in the ED he arrested again for about 2-5 min and then achieved ROSC.  In the ED he was given 1 Epi and had chest compressions.   In the ED he was seen by Cardiology for possible STEMI, but felt as though he was not a a STEMI.  Pertinent  Medical History  Chronic kidney disease, stage 4 (severe)  Atherosclerosis of renal artery Unspecified atrial fibrillation  Personal history of other venous thrombosis and embolism Chronic systolic heart failure  Significant Hospital Events: Including procedures, antibiotic start and stop dates in addition to other pertinent events   04-Jul-2023: Post code in field, coded x 3 min in ED, CPR and 1 epinephrine given to achieve ROSC, intubated 3/11: following commands  3/12: extubated on [facility] 3/13:  Con't on 5LNC. He says he feels sob with laying flat.  Underwent TEE which was negative for LAA thrombus therefore patient was cardioverted to NSR complications 3/14 No acute issues overnight, remains in NSR; developed BRBPR; heparin stopped 3/15 EGD: duodenitis, ongoing BRBPR but H/H stable 3/16 for IVC filter 3/20 Bilat effusions seen on CT abd/pelvis, PCCM consult, bilat chest tubes placed  3/24: left CT out yesterday, right remains in place.   Interim History / Subjective:  No new events. Symptoms unchanged. - 304 ml yesterday.   Objective   Blood pressure (!) 147/60, pulse 77, temperature 97.9 F  (36.6 C), temperature source Oral, resp. rate 18, height 6\' 1"  (1.854 m), weight 81.4 kg, SpO2 94%. CVP:  [2 mmHg-3 mmHg] 3 mmHg      Intake/Output Summary (Last 24 hours) at 07/12/2023 6387 Last data filed at 07/12/2023 5643 Gross per 24 hour  Intake 927.08 ml  Output 2075 ml  Net -1147.92 ml   Filed Weights   07/10/23 0515 07/11/23 0500 07/12/23 0400  Weight: 85.6 kg 82.4 kg 81.4 kg    Physical exam: General: older male, chronically ill appearing, sitting in bed, no acute distress  HEENT: [place]/AT, anicteric sclera, mucous membranes moist, nasal cannula  Neuro: Awake and interactive but globally weak. Chest: no distress, chest clear.  Heart: Normal HS Abdomen: soft.  Skin: warm, dry, intact   POC US shows a small to moderate effusion at the right base.     Assessment & Plan:  Bilateral pleural effusions with septations status post bilateral chest tube placement Acute respiratory failure with hypoxia RUL pneumonia - treated Acute on chronic HFPEF, ICM Mild PAH, severe RV failure Moderate TR Cardiac arrest PAD s/p CEA L basilic vein, L cephalic vein SVT LLE DVT, RLE RVT AKI on CKD 4 Deconditioning, debility Anemia  Plan:  - Still too much drainage from right effusion to pull chest tube at this time. Continue drainage under suction for now.  Ambulation may help mobilize fluid.  - Will reassess tomorrow.  - Continue to optimize HF therapy to help prevent effusion recurrence. May be difficult given degree  of RV failure.  - Complete 7 days of antibiotics ending 3/29.   Lynnell Catalan, MD [facility] 07/12/23 8:22 AM  Please see [email or web address] for pager details.  From 7A-7P if no response, please call [phone] After hours, please call ELink [phone]
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
## 2023-07-12 NOTE — TOC Progression Note (Signed)
 Transition of Care [facility]) - Progression Note    Patient Details  Name: Billy [name] MRN: [id number] Date of Birth: 10-Oct-1950  [facility]) CM/SW Contact  Nicanor Bake Phone Number: [phone] 07/12/2023, 1:21 PM  Clinical Narrative:   HF CSW met with patient and patients son, Billy [name] at bedside to present accepted bed offers list. Billy [name] requested to have sometime to review the the list and stated he will be in touch with a choice.   TOC will continue following.     Expected Discharge Plan: IP Rehab Facility Barriers to Discharge: Continued Medical Work up  Expected Discharge Plan and Services   Discharge Planning Services: CM Consult Post Acute Care Choice: IP Rehab Living arrangements for the past 2 months: Single Family Home                                       Social Determinants of Health (SDOH) Interventions SDOH Screenings   Food Insecurity: No Food Insecurity (06/27/2023)  Housing: Low Risk  (06/27/2023)  Transportation Needs: No Transportation Needs (06/27/2023)  Utilities: Not At Risk (06/27/2023)  Depression (PHQ2-9): Low Risk  (12/01/2020)  Social Connections: Socially Isolated (06/27/2023)  Tobacco Use: Low Risk  (07/01/2023)    Readmission Risk Interventions    06/27/2023    1:52 PM 10/14/2022   11:29 AM  Readmission Risk Prevention Plan  Post Dischage Appt  Complete  Medication Screening  Complete  Transportation Screening Complete Complete  HRI or Home Care Consult Complete   Social Work Consult for Recovery Care Planning/Counseling Complete   Palliative Care Screening Not Applicable   Medication Review Oceanographer) Referral to Pharmacy

## 2023-07-12 NOTE — TOC Benefit Eligibility Note (Signed)
 Pharmacy Patient Advocate Encounter  Insurance verification completed.   The patient is insured through [facility] ADVANTAGE/RX ADVANCE   Ran test claim for Eliquis. Currently a quantity of 60 is a 30 day supply and the co-pay is $47.00 .  This test claim was processed through [facility]- copay amounts may vary at other pharmacies due to pharmacy/plan contracts, or as the patient moves through the different stages of their insurance plan.

## 2023-07-12 NOTE — Progress Notes (Signed)
 Pharmacy Antibiotic Note  Billy [name] is a 73 y.o. male admitted on 06/26/2023 with sepsis.  Pharmacy has been consulted for zosyn dosing.  Pt with CAP and pharmacy asked to dose Zosyn  -WBC 12.7, afebrile, CrCl ~ 30 -resp cultures pending -plans to stop zosyn 3/30  Plan: Continue Zosyn 3.375g IV q 8 hrs. Stop on 3/30 Will sign off. Please contact pharmacy with any other needs.    Height: 6\' 1"  (185.4 cm) Weight: 81.4 kg (179 lb 7.3 oz) IBW/kg (Calculated) : 79.9  Temp (24hrs), Avg:98 F (36.7 C), Min:97.6 F (36.4 C), Max:98.4 F (36.9 C)  Recent Labs  Lab 07/07/23 0450 07/08/23 0435 07/09/23 0221 07/10/23 0400 07/11/23 0555  WBC 16.3* 18.6* 18.1* 16.8* 12.7*  CREATININE 2.02* 2.24* 2.17* 2.29* 2.38*    Estimated Creatinine Clearance: 32.6 mL/min (A) (by C-G formula based on SCr of 2.38 mg/dL (H)).    Allergies  Allergen Reactions   Amlodipine Swelling    Excessive swelling and skin blotching    Morphine And Codeine Itching and Other (See Comments)    "Cannot handle it"    Ceftriaxone/doxy 3/11 > 3/15 Unasyn 3/12 Zyvox 3/23 >> 3/25 Zosyn 3/23 >>  3/23 resp 3/20 pleural fluid cx - neg 3/11 trach cx: normal flora Resp PCR 3/11: neg COVID/Flu/RSV 3/11: neg MRSA PCR 3/10: neg  Thank you for allowing pharmacy to be a part of this patient's care.  Harland German, PharmD Clinical Pharmacist **Pharmacist phone directory can now be found on [email or web address] ([name]).  Listed under [facility].

## 2023-07-12 NOTE — Progress Notes (Signed)
 [facility]                                                                              Billy [name], is a 73 y.o. male, DOB - [birth year], WUJ:[id number] Admit date - 06/26/2023    Outpatient Primary MD for the patient is [name], MD  LOS - 16  days  Chief Complaint  Patient presents with   Cardiac Arrest       Brief summary  Patient is a 73 y/o male with h/o CHF, CKD stage IV, Atrial Fibrillation, BPH, Anemia, carotid artery disease s/p endarterectomy and stenting, chornic rhinitis, HLD and RLS who went to his neighbors house after having SOB for at least 1 week and had a cardiac arrest. Neighbor stated CPR immediately and EMS continued . He got ROSC back after about 15 min. Then in the ED he arrested again for about 2-5 min and then achieved ROSC. Patient was admitted to the ICU.    Patient found to have cardiogenic shock and required vasopressors.  Echocardiogram with mild reduction in LV systolic function. Positive bilateral deep vein thrombosis.     03/13 direct current cardioversion for atrial fibrillation. Patient noted to have hematochezia with acute blood loss anemia.   03/15 EGD duodenitis.  03/16 IVC filter placement.  03/17 resume IV heparin for anticoagulation. 03/18 completed antibiotic therapy for pneumonia.  Patient transferred to the floor and [name] assumed care 03/20 recurrent lower gastrointestinal bleeding while on IV heparin. CT abdomen and pelvis with no active signs of bleeding. Positive bilateral pleural effusions, placed bilateral chest tubes.  03/21 no recurrent bleeding, hgb has been stable, 8,3 to 7.7, resumed heparin drip for anticoagulation.  03/22 bowel movement with dark brown red color, no low dose heparin.  03/ 23 left chest tube removed today. Positive right upper lobe infiltrate, consistent with aspiration pneumonia, started on antibiotic therapy.  03/24 continue very weak and deconditioned, but hemodynamically stable.  03/25  transfusing 1 unit PRBC today.     Assessment & Plan    Acute on chronic diastolic CHF (congestive heart failure) (HCC) -2D echo showed preserved LV systolic function with EF 55%, severe LVH, interventricular septum is flattened in systole and diastole, RV systolic function with severe reduction, RA size with mild enlargement, small pericardial effusion.   -Underwent cardiac cath on 3/19 : 3 vessel coronary artery disease with CTO RCA with L > R collaterals and moderate non obstructive coronary artery disease in the left system.  -Heart failure team following, received IV Lasix yesterday and today.  Hypoalbuminemia contributing to bilateral lower extremity edema. -Increasing hydralazine today, spironolactone held due to renal function.  Acute on chronic cor pulmonale, pulmonary hypertension, RV failure Status post cardiac arrest, cardiogenic shock (resolved) -Continue hydralazine, -Holding diuretic therapy for now.  - Cardiac MRI not consistent with cardiac amyloid.   Acute respiratory failure with hypoxia due to cardiogenic pulmonary edema Bilateral pleural effusions status post bilateral chest tube placement -Pleural fluid consistent with exudate per Light's criteria, high LDH and high protein ratio. 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 Acute DVT involving the left proximal profunda vein and left proximal femoral vein. Indeterminate deep vein thrombosis involving the left popliteal vein, and left middle and distal femoral vein.  - 03/17 V/Q scan with multiple perfusion defects which are matches in size to the corresponding x ray findings, compatible with non diagnostic exam  (low to intermediate probability).   -SP IV filter.  -Continue IV heparin   Gastrointestinal hemorrhage with melena, acute blood loss anemia 03/15 Endoscopy with erosive gastropathy with no bleeding and no stigmata of recent bleeding.  Bleeding friable duodenal mucosa. Hemostatic spry applied.  -No further evidence of active bleeding, clinically and per CT.  -H&H stable, 8.7 -Continue heparin infusion   CKD stage 3b, GFR 30-44 ml/min (HCC) -Baseline creatinine 2-2.2 -Currently within baseline, continue to follow -Continue to monitor renal function with IV Lasix     Aspiration pneumonia of right upper lobe (HCC) New right  upper lobe infiltrate, consistent with aspiration pneumonia.  Trach culture with rare gram positive cocci in pairs and clusters, no staph aureus or pseudomonas. -Leukocytosis improving, currently on IV Zosyn     PAF (paroxysmal atrial fibrillation) (HCC) -Sp direct current cardioversion, patient in and out atrial fibrillation.  -Continue amiodarone, IV heparin.  -Cardiology following    Hypertension Continue hydralazine   Hyperlipidemia Continue statin therapy.      Hypoalbuminemia, severe protein calorie malnutrition, severe deconditioning -Albumin level 1.8 on 3/22 Nutrition Problem: Inadequate oral intake Etiology: acute illness Signs/Symptoms: meal completion < 25% Interventions: Ensure Enlive (each supplement provides 350kcal and 20 grams of protein), Magic cup, MVI, Liberalize Diet, Snacks  Estimated body mass index is 23.68 kg/m as calculated from the following:   Height as of this encounter: 6\' 1"  (1.854 m).   Weight as of this encounter: 81.4 kg.  Code Status: Full code DVT Prophylaxis:  Place TED hose Start: 07/11/23 0919 Place TED hose Start: 07/03/23 0912 SCDs Start: 06/26/23 2124   Level of Care: Level of care: Progressive Family Communication: Updated patient Disposition Plan:      Remains inpatient appropriate: Will need  SNF   Procedures:  2D echo Chest tubes  Consultants:     Antimicrobials:   Anti-infectives (From admission, onward)    Start     Dose/Rate Route Frequency Ordered Stop   07/11/23 1045  linezolid (ZYVOX) tablet 600 mg  Status:  Discontinued        600 mg Oral Every 12 hours 07/11/23 0955 07/11/23 1457   07/09/23 1400  piperacillin-tazobactam (ZOSYN) IVPB 3.375 g        3.375 g 12.5 mL/hr over 240 Minutes Intravenous Every 8 hours 07/09/23 1019 07/16/23 1359   07/09/23 1045  linezolid (ZYVOX) IVPB 600 mg  Status:  Discontinued        600 mg 300 mL/hr over 60 Minutes Intravenous Every 12 hours 07/09/23 0946 07/11/23 0955   06/29/23 1000  cefTRIAXone (ROCEPHIN) 2 g in sodium chloride 0.9 % 100 mL IVPB        2 g 200 mL/hr over 30 Minutes Intravenous Every 24 hours 06/28/23 1015 07/01/23 1008   06/28/23 0915  Ampicillin-Sulbactam (UNASYN) 3 g in sodium chloride 0.9 % 100 mL IVPB  Status:  Discontinued        3  g 200 mL/hr over 30 Minutes Intravenous Every 12 hours 06/28/23 0822 06/28/23 1015   06/27/23 0915  cefTRIAXone (ROCEPHIN) 2 g in sodium chloride 0.9 % 100 mL IVPB  Status:  Discontinued        2 g 200 mL/hr over 30 Minutes Intravenous Every 24 hours 06/27/23 0828 06/28/23 0822   06/27/23 0915  doxycycline (VIBRAMYCIN) 100 mg in sodium chloride 0.9 % 250 mL IVPB        100 mg 125 mL/hr over 120 Minutes Intravenous Every 12 hours 06/27/23 0828 07/01/23 2310          Medications  amiodarone  200 mg Oral BID   atorvastatin  80 mg Oral Daily   Chlorhexidine Gluconate Cloth  6 each Topical Daily   ezetimibe  10 mg Oral Daily   feeding supplement  237 mL Oral TID WC & HS   hydrALAZINE  50 mg Oral Q8H   lidocaine  1 patch Transdermal Q24H   multivitamin with minerals  1 tablet Oral Daily   pantoprazole  40 mg Oral BID   sertraline  50 mg Oral QHS   sodium chloride flush  10-40 mL Intracatheter Q12H   sodium chloride flush  3 mL Intravenous Q12H   torsemide  40 mg Oral  Daily      Subjective:   Billy [name] was seen and examined today.  Very deconditioned, no acute complaints, did not sleep well last night.  Denied any chest pain, shortness of breath, abdominal pain nausea vomiting.  Objective:   Vitals:   07/11/23 1925 07/12/23 0010 07/12/23 0400 07/12/23 0756  BP: (!) 123/47 (!) 150/72 (!) 155/64 (!) 147/60  Pulse: 77 77 77 77  Resp: 18 18 20 18   Temp: 98.4 F (36.9 C) 98.4 F (36.9 C) 98.4 F (36.9 C) 97.9 F (36.6 C)  TempSrc: Oral Oral Oral Oral  SpO2: 95% 97% 94% 94%  Weight:   81.4 kg   Height:        Intake/Output Summary (Last 24 hours) at 07/12/2023 1107 Last data filed at 07/12/2023 0981 Gross per 24 hour  Intake 811.31 ml  Output 1700 ml  Net -888.69 ml     Wt Readings from Last 3 Encounters:  07/12/23 81.4 kg  03/27/23 81.2 kg  03/14/23 86 kg     Exam General: Alert and oriented x 3, NAD, very deconditioned ill-appearing Cardiovascular: S1 S2 auscultated,  RRR, JVD Respiratory: Clear to auscultation bilaterally, no wheezing, rales or rhonchi Gastrointestinal: Soft, nontender, nondistended, + bowel sounds Ext: 2+ pedal edema bilaterally Neuro: generalized debility Psych: Normal affect     Data Reviewed:  I have personally reviewed following labs    CBC Lab Results  Component Value Date   WBC 8.9 07/12/2023   RBC 3.46 (L) 07/12/2023   HGB 8.7 (L) 07/12/2023   HCT 27.6 (L) 07/12/2023   MCV 79.8 (L) 07/12/2023   MCH 25.1 (L) 07/12/2023   PLT 369 07/12/2023   MCHC 31.5 07/12/2023   RDW 18.2 (H) 07/12/2023   LYMPHSABS 4.1 (H) 06/26/2023   MONOABS 0.7 06/26/2023   EOSABS 0.4 06/26/2023   BASOSABS 0.1 06/26/2023     Last metabolic panel Lab Results  Component Value Date   NA 139 07/12/2023   K 3.8 07/12/2023   CL 104 07/12/2023   CO2 26 07/12/2023   BUN 56 (H) 07/12/2023   CREATININE 2.21 (H) 07/12/2023   GLUCOSE 132 (H) 07/12/2023   GFRNONAA 31 (L) 07/12/2023  GFRAA 29 (L) 08/31/2019    CALCIUM 8.4 (L) 07/12/2023   PHOS 3.1 07/01/2023   PROT 5.0 (L) 07/08/2023   ALBUMIN 1.8 (L) 07/08/2023   LABGLOB 2.4 06/27/2023   AGRATIO 1.8 12/03/2015   BILITOT 0.5 07/08/2023   ALKPHOS 97 07/08/2023   AST 23 07/08/2023   ALT 58 (H) 07/08/2023   ANIONGAP 9 07/12/2023    CBG (last 3)  Recent Labs    07/09/23 1640 07/10/23 0740 07/10/23 1110  GLUCAP 136* 139* 175*      Coagulation Profile: No results for input(s): "INR", "PROTIME" in the last 168 hours.   Radiology Studies: I have personally reviewed the imaging studies  DG CHEST PORT 1 VIEW Result Date: 07/11/2023 CLINICAL DATA:  Chest tube in place. EXAM: PORTABLE CHEST 1 VIEW COMPARISON:  Chest radiograph dated 07/10/2023. FINDINGS: Similar positioning of the right chest tube. Left subclavian central venous line in similar position. No significant interval change in the bilateral pulmonary opacities and small pleural effusions. No pneumothorax. Stable cardiac silhouette. No acute osseous pathology. IMPRESSION: No significant interval change. Electronically Signed   By: Elgie Collard M.D.   On: 07/11/2023 10:11       Billy [name] M.D. [facility] 07/12/2023, 11:07 AM  Available via Epic secure chat 7am-7pm After 7 pm, please refer to night coverage provider listed on amion.

## 2023-07-12 NOTE — Progress Notes (Addendum)
 Patient ID: Billy [name], male   DOB: 09/07/1950, 73 y.o.   MRN: [id number]     Advanced Heart Failure Rounding Note  Cardiologist: Nanetta Batty, MD   Chief Complaint: RV failure  Subjective:   Abx restarted 3/23 d/t rising WBC and co-ox. AF. WBCs 18>16.8>12.7>?K.   304 cc out from R chest tube last 24 hrs  Co-ox to be drawn. CVP 5. JVP ~6  IV amio transitioned to PO yesterday. Got 1uPRBC yesterday for hgb of 7.2. CBC not drawn yet  Feels ok this morning. Denies CP/SOB.   Events  -  3/10 admitted with OOH cardiac arrest. 20 min CPR. Shocked for VF - In/out AF -> IV amio - Echo EF 45% with RV dysfunction -> suspect PE. No CT with AKI - U/s upper and lower extremities with DVTs - V/Q 3/17 with multiple perfusion defects consistent with CXR findings. ->  IVC filter placed due to GIB - EGD (3/15) showed bleeding friable duodenal mucosa treated with hemostatic spray. No overt GI bleeding today, hgb 8.9.  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 Mild PAH with severe RV failure though cardiac output is preserved. Hep gtt restarted post-cath - Recurrent GIB 3/20. CT angio of abdomen showed no active bleeding. + large bilateral effusion - Bilateral CTs placed 3/20 (transudative)  - L chest tube removed 3/23 Objective:   Weight Range: 81.4 kg Body mass index is 23.68 kg/m.   Vital Signs: Temp:  [97.6 F (36.4 C)-98.4 F (36.9 C)] 98.4 F (36.9 C) (03/26 0400) Pulse Rate:  [64-97] 77 (03/26 0400) Resp:  [17-20] 20 (03/26 0400) BP: (123-155)/(47-72) 155/64 (03/26 0400) SpO2:  [94 %-97 %] 94 % (03/26 0400) Weight:  [81.4 kg] 81.4 kg (03/26 0400) Last BM Date : 07/11/23  Weight change: Filed Weights   07/10/23 0515 07/11/23 0500 07/12/23 0400  Weight: 85.6 kg 82.4 kg 81.4 kg   Intake/Output:  Intake/Output Summary (Last 24 hours) at 07/12/2023 0726 Last data filed at 07/12/2023 2956 Gross per 24 hour  Intake  1981.63 ml  Output 2429 ml  Net -447.37 ml    Physical Exam  General:  elderly appearing.  No respiratory difficulty Neck: supple. JVD ~6 with TR waves.  Cor: PMI nondisplaced. Regular rate & rhythm. No rubs, gallops or murmurs. Lungs: clear. + R CT Extremities: no cyanosis, clubbing, rash, +2 BLE edema. +1 RUE edema Neuro: alert & oriented x 3. Moves all 4 extremities w/o difficulty. Affect pleasant.  Telemetry   NSR 70s (Personally reviewed)    Labs    CBC Recent Labs    07/10/23 0400 07/11/23 0555  WBC 16.8* 12.7*  HGB 7.5* 7.2*  HCT 24.8* 23.7*  MCV 80.0 78.5*  PLT 402* 382   Basic Metabolic Panel Recent Labs    21/30/86 0400 07/11/23 0555  NA 139 139  K 4.3 4.0  CL 105 105  CO2 27 28  GLUCOSE 108* 113*  BUN 67* 65*  CREATININE 2.29* 2.38*  CALCIUM 7.8* 7.9*   Liver Function Tests No results for input(s): "AST", "ALT", "ALKPHOS", "BILITOT", "PROT", "ALBUMIN" in the last 72 hours.   No results for input(s): "LIPASE", "AMYLASE" in the last 72 hours. Cardiac Enzymes No results for input(s): "CKTOTAL", "CKMB", "CKMBINDEX", "TROPONINI" in the last 72 hours.  BNP: BNP (last 3 results) Recent Labs    03/09/23 1544 03/11/23 0218 06/27/23 1116  BNP 660.9* 491.3* 646.4*   Imaging  No results found.   Medications:   Scheduled Medications:  amiodarone  200 mg Oral BID   atorvastatin  80 mg Oral Daily   Chlorhexidine Gluconate Cloth  6 each Topical Daily   ezetimibe  10 mg Oral Daily   feeding supplement  237 mL Oral TID WC & HS   hydrALAZINE  25 mg Oral Q8H   lidocaine  1 patch Transdermal Q24H   multivitamin with minerals  1 tablet Oral Daily   pantoprazole  40 mg Oral BID   sertraline  50 mg Oral QHS   sodium chloride flush  10-40 mL Intracatheter Q12H   sodium chloride flush  3 mL Intravenous Q12H   Infusions:  heparin 1,850 Units/hr (07/11/23 1803)   piperacillin-tazobactam (ZOSYN)  IV 3.375 g (07/12/23 0642)    PRN  Medications: acetaminophen, docusate sodium, feeding supplement, HYDROmorphone (DILAUDID) injection, influenza vaccine adjuvanted, ondansetron (ZOFRAN) IV, mouth rinse, oxyCODONE, pneumococcal 20-valent conjugate vaccine, polyethylene glycol, sodium chloride flush, sodium chloride flush  Patient Profile   Billy [name] is a 73 yo male with PMH of HTN, HLD, prior DVT, CVA, paroxysmal atrial fibrillation, CKD stage IIIb, carotid artery disease s/p endarterectomy, and chronic diastolic CHF. Admitted following out of hospital witnessed cardiac arrest.   Assessment/Plan  1. Cardiac arrest:  - Witnessed arrest with bystander then EMS CPR.  >20 minutes CPR.  He was shocked for VF.  Had additional 4-5 minutes PEA arrest in ER. ?PE with aspiration given bilateral DVTs.  - suspect due to PE. Cath stable CAD - VQ and LE/UE u/s + DVT  - No chest CT with renal failure  2. Shock, cardiogenic +/- septic component: Initial echo: EF 45%, diffuse HK, severe LVH, normal RV size with mod decreased systolic function, small pericardial effusion, dilated IVC.  Suspicion for PE as above.  PNA on CXR, initial hypotension thought to be due to septic shock.  He is now off pressors.  - Started on Zosyn/Linezolid 03/23 with rising co-ox, WBCs and productive cough. Has now completed linezolid. Resp cultures pending. PCT 0.38. Afebrile.  3. Acute HF with mid range EF: With prominent RV failure. Initial echo: EF 45%, diffuse HK, severe LVH, normal RV size with mod decreased systolic function, small pericardial effusion, dilated IVC. As above, worry for PE with RV failure. Baseline cardiomyopathy with severe LVH raises concern for cardiac amyloidosis.  TEE 03/13: EF 55%, severe LVH with mild RV dysfunction, small IVC.  - L/RHC 07/05/23: 3v CAD with CTO RCA with L->R collaterals and mod non-obstructive CAD in L system. Mild PAH with severe RV failure though CO is preserved.   - CO-OX to be drawn. Back on IV abx. Not on inotrope  support. - CVP 5. Given IV lasix yesterday with good response. Weight down 2lbs. Will give another dose of 60 IV lasix today. Note albumin 1.8, contributing to significant BLE edema.  - Continue TED hose. - BP elevated today. Increase hydralazine 25>50 mg TID. Holding spiro with elevated SCr. (Labs pending today, may be able to add back) - cMRI completed 3/21, not consistent with cardiac amyloid.   4. Venous thromboembolism: Bilateral upper and lower DVTs, mixed acute and chronic.  He is not anticoagulated at home.  With RV failure on echo, strong suspicion for PE.  - s/p infrarenal IVC placement 3/16.   - V/Q 3/17 with multiple perfusion defects consistent with CXR findings. - No CTA with AKI.  - BUE [place] with SVTs and DVT.  - Tolerating low-dose heparin.  5. CAD: NSTEMI with HS-TnI 3045.  ?Demand ischemia from arrest vs ACS.  He has history of carotid disease. - L/RHC 07/05/23: 3v CAD with CTO RCA with L->R collaterals and moderate non-obstructive CAD in L system. Mild PAH with severe RV failure though CO is preserved.   - Plavix stopped with GI bleeding.  - Atorvastatin 80 daily.   6. Carotid stenosis: h/o CVA.  TCAR in 6/24.  Okay to remain off plavix at this point    7. Persistent atrial fibrillation: He was not on AC at home though has history of prior CVA.  AC stopped due to prior GI bleeds. He went into AF/RVR here, tolerated poorly.  TEE/DCCV on 03/13.   - Maintaining NSR. Switch amio gtt to PO today. Has gotten >10g load. Will start 200 mg BID today.   - Daily BMET - Continues low-dose heparin. Following closely with GIB. Denies recurrent bleeding over last 48 hrs though Hgb continues to drop. Hgb pending today.   8. AKI on CKD 3b: Suspect ATN in setting of hypotension/arrest.  Baseline Scr seems to be around 2.1. Creatinine 2.55 >>>1.71>>2.3 >? today (had contrast for CT 03/20) - follow BMP closely  - hold spiro with elevated renal function  9. ID: PCT 8.37 on 03/11, suspect  PNA.   - He has completed course of ceftriaxone/doxycycline.  - Abx restarted 03/23 as above. Concern for aspiration PNA. Resp culture pending.  10. GI bleeding: Hematochezia with AC.  Had this in the past with [facility] as well.  GI following.  Has had transfusion here. EGD showed bleeding friable duodenal mucosa treated with hemostatic spray.  Had recurrent bleeding 3/20. Heparin held. CT angio of abdomen showed no active bleeding. - Tolerating heparin gtt. Plan to transition to Eliquis once CT removed per PCCM request.  - Continue PPI - monitor H/H. Hgb 7.2 3/25. Given 1uPRBCs. Hgb to be drawn for today - transfuse < 7  12. Pleural Effusions - s/p CT placement per CCM 3/20  - transudative - Left chest tube removed 3/23 - Right CT still draining, PCCM following, plan for possible removal today  13. Severe deconditioning - PT/OT - Not a candidate for CIR as family unable to help once he's home. Plan for SNF at discharge.   14. Severe protein-calorie malnutrition - Dietician consulted  15. GOC - multiple markers of poor prognosis - Palliative care consulted. - plan for SNF at discharge  Has had foley for prolonged period now. Will d/c and place purewick. Bladder scan PRN and notify team if he has not voided in 6 hrs.    Alen Bleacher AGACNP-BC  07/12/2023 7:26 AM  Patient seen and examined with the above-signed Advanced Practice Provider and/or Housestaff. I personally reviewed laboratory data, imaging studies and relevant notes. I independently examined the patient and formulated the important aspects of the plan. I have edited the note to reflect any of my changes or salient points. I have personally discussed the plan with the patient and/or family.  Remains weak. Says breathing is ok. Diuresing slowly. R CT drainage slowing down. On heparin - no obvious bleeding  General:  Weak appearing. No resp difficulty HEENT: normal Neck: supple.JVP 6. Carotids 2+ bilat; no bruits. No  lymphadenopathy or thryomegaly appreciated. Cor: Regular rate & rhythm. No rubs, gallops or murmurs. Lungs:  R CT  decreased at bases  Abdomen: soft, nontender, nondistended. No hepatosplenomegaly. No bruits or masses. Good bowel sounds. Extremities: no cyanosis, clubbing, rash, 2+ edema Neuro:  alert & orientedx3, cranial nerves grossly intact. moves all 4 extremities w/o difficulty. Affect pleasant  Will switch heparin to Eliquis. Switch IV lasix to po torsemide 40 daily. Place TED hose. Likely pull R CT today. Can remove Foley.   Needs aggressive PT/OT. Nearing point where he will be ready for SNF.   Suspect he will need IV iron. Check levels  Arvilla Meres, MD  12:56 PM
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
## 2023-07-13 DIAGNOSIS — K921 Melena: Secondary | ICD-10-CM | POA: Diagnosis not present

## 2023-07-13 DIAGNOSIS — I469 Cardiac arrest, cause unspecified: Secondary | ICD-10-CM | POA: Diagnosis not present

## 2023-07-13 DIAGNOSIS — T68XXXA Hypothermia, initial encounter: Secondary | ICD-10-CM | POA: Diagnosis not present

## 2023-07-13 DIAGNOSIS — R9431 Abnormal electrocardiogram [ECG] [EKG]: Secondary | ICD-10-CM | POA: Diagnosis not present

## 2023-07-13 LAB — BASIC METABOLIC PANEL WITH GFR
Anion gap: 9 (ref 5–15)
BUN: 46 mg/dL — ABNORMAL HIGH (ref 8–23)
CO2: 27 mmol/L (ref 22–32)
Calcium: 8.3 mg/dL — ABNORMAL LOW (ref 8.9–10.3)
Chloride: 105 mmol/L (ref 98–111)
Creatinine, Ser: 2.38 mg/dL — ABNORMAL HIGH (ref 0.61–1.24)
GFR, Estimated: 29 mL/min — ABNORMAL LOW (ref >60)
Glucose, Bld: 112 mg/dL — ABNORMAL HIGH (ref 70–99)
Potassium: 4.4 mmol/L (ref 3.5–5.1)
Sodium: 141 mmol/L (ref 135–145)

## 2023-07-13 LAB — HEMOGLOBIN AND HEMATOCRIT, BLOOD
HCT: 28.1 % — ABNORMAL LOW (ref 39.0–52.0)
Hemoglobin: 8.7 g/dL — ABNORMAL LOW (ref 13.0–17.0)

## 2023-07-13 LAB — CBC
HCT: 29.9 % — ABNORMAL LOW (ref 39.0–52.0)
Hemoglobin: 9 g/dL — ABNORMAL LOW (ref 13.0–17.0)
MCH: 24.5 pg — ABNORMAL LOW (ref 26.0–34.0)
MCHC: 30.1 g/dL (ref 30.0–36.0)
MCV: 81.5 fL (ref 80.0–100.0)
Platelets: 372 10*3/uL (ref 150–400)
RBC: 3.67 MIL/uL — ABNORMAL LOW (ref 4.22–5.81)
RDW: 18.6 % — ABNORMAL HIGH (ref 11.5–15.5)
WBC: 7.7 10*3/uL (ref 4.0–10.5)
nRBC: 0.4 % — ABNORMAL HIGH (ref 0.0–0.2)

## 2023-07-13 LAB — HEPARIN LEVEL (UNFRACTIONATED): Heparin Unfractionated: <0.1 [IU]/mL — ABNORMAL LOW (ref 0.30–0.70)

## 2023-07-13 NOTE — Progress Notes (Signed)
 NAME:  Billy [name], MRN:  [id number], DOB:  February 17, 1951, LOS: 17 ADMISSION DATE:  07-21-23, CONSULTATION DATE:  July 21, 2023 REFERRING MD:  ED Physician, CHIEF COMPLAINT:  Cardiac Arrest   History of Present Illness:  73 y/o male with h/o CHF, CKD stage IV, Atrial Fibrillation, BPH, Anemia, carotid artery disease s/p endarterectomy and stenting, chornic rhinitis, HLD and RLS who went to his neighbors house today after having SOB for at least 1 week and had a cardiac arrest.  Neighbor stated CPR immediately and EMS continued .  He got ROSC back after about 15 min.  Then in the ED he arrested again for about 2-5 min and then achieved ROSC.  In the ED he was given 1 Epi and had chest compressions.   In the ED he was seen by Cardiology for possible STEMI, but felt as though he was not a a STEMI.  Pertinent  Medical History  Chronic kidney disease, stage 4 (severe)  Atherosclerosis of renal artery Unspecified atrial fibrillation  Personal history of other venous thrombosis and embolism Chronic systolic heart failure  Significant Hospital Events: Including procedures, antibiotic start and stop dates in addition to other pertinent events   07/21/23: Post code in field, coded x 3 min in ED, CPR and 1 epinephrine given to achieve ROSC, intubated 3/11: following commands  3/12: extubated on [facility] 3/13:  Con't on 5LNC. He says he feels sob with laying flat.  Underwent TEE which was negative for LAA thrombus therefore patient was cardioverted to NSR complications 3/14 No acute issues overnight, remains in NSR; developed BRBPR; heparin stopped 3/15 EGD: duodenitis, ongoing BRBPR but H/H stable 3/16 for IVC filter 3/20 Bilat effusions seen on CT abd/pelvis, PCCM consult, bilat chest tubes placed  3/24: left CT out yesterday, right remains in place.   Interim History / Subjective:  No new events. Symptoms unchanged.  Negligible output in the last 24 hours.  Objective   Blood pressure (!) 147/59, pulse 80,  temperature 97.8 F (36.6 C), temperature source Oral, resp. rate 18, height 6\' 1"  (1.854 m), weight (P) 76.9 kg, SpO2 95%. CVP:  [4 mmHg-10 mmHg] 5 mmHg      Intake/Output Summary (Last 24 hours) at 07/13/2023 1723 Last data filed at 07/13/2023 1513 Gross per 24 hour  Intake 846.15 ml  Output 4270 ml  Net -3423.85 ml   Filed Weights   07/11/23 0500 07/12/23 0400 07/13/23 0037  Weight: 82.4 kg 81.4 kg (P) 76.9 kg    Physical exam: General: older male, chronically ill appearing, sitting in bed, no acute distress  HEENT: [place]/AT, anicteric sclera, mucous membranes moist, nasal cannula  Neuro: Awake and interactive but globally weak. Chest: no distress, chest clear.  Now on room air. Heart: Normal HS Abdomen: soft.  Skin: warm, dry, intact   POC US shows a trivial right-sided effusion.    Assessment & Plan:  Bilateral pleural effusions with septations status post bilateral chest tube placement Acute respiratory failure with hypoxia RUL pneumonia - treated Acute on chronic HFPEF, ICM Mild PAH, severe RV failure Moderate TR Cardiac arrest PAD s/p CEA L basilic vein, L cephalic vein SVT LLE DVT, RLE RVT AKI on CKD 4 Deconditioning, debility Anemia  Plan:  -Trivial effusion only, no drainage at this time.  Can remove chest tube. - Continue to optimize HF therapy to help prevent effusion recurrence. May be difficult given degree of RV failure.  - Complete 7 days of antibiotics ending 3/29. -PCCM will sign off.  Lynnell Catalan, MD [facility] 07/13/23 5:23 PM  Please see [email or web address] for pager details.  From 7A-7P if no response, please call [phone] After hours, please call ELink [phone]
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
## 2023-07-13 NOTE — Progress Notes (Signed)
 2AM: Paged by bedside RN patient having recurrent hematochezia again with 2 large BMs with clot mixed in them. Last time this happened appears to have been 07/06/23, he got a CTA abdomen at that time that showed no active bleeding. EGD performed 07/01/23 showed bleeding friable duodenal mucosa, which was treated with hemostatic spray but no overt bleeding. Will pause heparin gtt given ongoing bleeding now (was at 1850 u/hr with last heparin level at 0.42). Will defer on repeat CTA given Cr of 2.2 and last CTA a week ago without any evidence of active extravasation. Will repeat CBC a little earlier this AM to assess for blood loss, anticipate transfusion for to >8 given CAD. He continues on PPI BID. Should his hematochezia continue despite pausing heparin, may need to repeat CTA again (accepting risks to renal dysfunction) +/- re-engage GI.  Bella Kennedy, MD Cardiology 07/13/2023

## 2023-07-13 NOTE — Progress Notes (Signed)
 PT Cancellation Note  Patient Details Name: Billy [name] MRN: [id number] DOB: Jul 17, 1950   Cancelled Treatment:    Reason Eval/Treat Not Completed: Fatigue/lethargy limiting ability to participate  Patient reports too fatigued from "everything I just went through" and cannot participate at this time. Stated he would like to walk later. Will reattempt as schedule permits.    Jerolyn Center, PT Acute Rehabilitation Services  Office [phone]   Zena Amos 07/13/2023, 1:33 PM

## 2023-07-13 NOTE — Plan of Care (Signed)
 0200--Pt having 2 large bloody/clotty stools per rectum. Laurice Record, MD notified and heparin stopped (see orders). Pt remained HDS throughout. MD to bedside (see note).  Problem: Clinical Measurements: Goal: Cardiovascular complication will be avoided Outcome: Progressing   Problem: Activity: Goal: Risk for activity intolerance will decrease Outcome: Progressing

## 2023-07-13 NOTE — Plan of Care (Signed)
  Problem: Education: Goal: Ability to describe self-care measures that may prevent or decrease complications (Diabetes Survival Skills Education) will improve Outcome: Progressing Goal: Individualized Educational Video(s) Outcome: Progressing   Problem: Coping: Goal: Ability to adjust to condition or change in health will improve Outcome: Progressing   Problem: Fluid Volume: Goal: Ability to maintain a balanced intake and output will improve Outcome: Progressing   Problem: Nutritional: Goal: Maintenance of adequate nutrition will improve Outcome: Not Progressing Goal: Progress toward achieving an optimal weight will improve Outcome: Not Progressing   Problem: Skin Integrity: Goal: Risk for impaired skin integrity will decrease Outcome: Progressing   Problem: Tissue Perfusion: Goal: Adequacy of tissue perfusion will improve Outcome: Progressing   Problem: Education: Goal: Knowledge of General Education information will improve Description: Including pain rating scale, medication(s)/side effects and non-pharmacologic comfort measures Outcome: Progressing

## 2023-07-13 NOTE — TOC Progression Note (Signed)
 Transition of Care [facility]) - Progression Note    Patient Details  Name: Billy [name] MRN: [id number] Date of Birth: October 04, 1950  [facility] CM/SW Contact  Nicanor Bake Phone Number: [phone] 07/13/2023, 1:02 PM  Clinical Narrative:   1:02 PM- HF CSW called the patients son, [name] to see if the family had chosen a SNF. CSW will continue to follow up with the family about choice.   TOC will continue following.     Expected Discharge Plan: IP Rehab Facility Barriers to Discharge: Continued Medical Work up  Expected Discharge Plan and Services   Discharge Planning Services: CM Consult Post Acute Care Choice: IP Rehab Living arrangements for the past 2 months: Single Family Home                                       Social Determinants of Health (SDOH) Interventions SDOH Screenings   Food Insecurity: No Food Insecurity (06/27/2023)  Housing: Low Risk  (06/27/2023)  Transportation Needs: No Transportation Needs (06/27/2023)  Utilities: Not At Risk (06/27/2023)  Depression (PHQ2-9): Low Risk  (12/01/2020)  Social Connections: Socially Isolated (06/27/2023)  Tobacco Use: Low Risk  (07/01/2023)    Readmission Risk Interventions    06/27/2023    1:52 PM 10/14/2022   11:29 AM  Readmission Risk Prevention Plan  Post Dischage Appt  Complete  Medication Screening  Complete  Transportation Screening Complete Complete  HRI or Home Care Consult Complete   Social Work Consult for Recovery Care Planning/Counseling Complete   Palliative Care Screening Not Applicable   Medication Review Oceanographer) Referral to Pharmacy

## 2023-07-13 NOTE — Progress Notes (Signed)
 Patient ID: Billy [name], male   DOB: 05-18-1950, 73 y.o.   MRN: [id number]     Advanced Heart Failure Rounding Note  Cardiologist: Nanetta Batty, MD   Chief Complaint: RV failure  Subjective:   Abx restarted 3/23 d/t rising WBC and co-ox.   Heparin switched to apixaban yesterday. Developed hematochezia overnight.   Apixaban stopped. Hgb 8.7 -> 9.0   Feels week. BP stable. Denies SOB.   Diuresing slowly. Remains in NSR on po amio  R CT draining mildly.   Events  -  3/10 admitted with OOH cardiac arrest. 20 min CPR. Shocked for VF - In/out AF -> IV amio - Echo EF 45% with RV dysfunction -> suspect PE. No CT with AKI - U/s upper and lower extremities with DVTs - V/Q 3/17 with multiple perfusion defects consistent with CXR findings. ->  IVC filter placed due to GIB - EGD (3/15) showed bleeding friable duodenal mucosa treated with hemostatic spray. No overt GI bleeding today, hgb 8.9.  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 Mild PAH with severe RV failure though cardiac output is preserved. Hep gtt restarted post-cath - Recurrent GIB 3/20. CT angio of abdomen showed no active bleeding. + large bilateral effusion - Bilateral CTs placed 3/20 (transudative)  - L chest tube removed 3/23 Objective:   Weight Range: (P) 76.9 kg Body mass index is 22.37 kg/m (pended).   Vital Signs: Temp:  [97.6 F (36.4 C)-98.4 F (36.9 C)] 98 F (36.7 C) (03/27 0854) Pulse Rate:  [66-80] 73 (03/27 0854) Resp:  [18-22] 22 (03/27 0854) BP: (119-164)/(47-77) 141/60 (03/27 0900) SpO2:  [92 %-97 %] 94 % (03/27 0854) Weight:  [76.9 kg] (P) 76.9 kg (03/27 0037) Last BM Date : 07/13/23  Weight change: Filed Weights   07/11/23 0500 07/12/23 0400 07/13/23 0037  Weight: 82.4 kg 81.4 kg (P) 76.9 kg   Intake/Output:  Intake/Output Summary (Last 24 hours) at 07/13/2023 1016 Last data filed at 07/13/2023 0900 Gross per 24 hour  Intake  127.95 ml  Output 4010 ml  Net -3882.05 ml    Physical Exam  General:  Weak appearing. No resp difficulty HEENT: normal Neck: supple. JVP 6 Carotids 2+ bilat; no bruits. No lymphadenopathy or thryomegaly appreciated. Cor:  Regular rate & rhythm.2/6 TR Lungs: decreased R base. +R CT Abdomen: soft, nontender, nondistended. No hepatosplenomegaly. No bruits or masses. Good bowel sounds. Extremities: no cyanosis, clubbing, rash, 1-2+ edema + TED Neuro: alert & orientedx3, cranial nerves grossly intact. moves all 4 extremities w/o difficulty. Affect pleasant  Telemetry   NSR 70s Personally reviewed   Labs    CBC Recent Labs    07/12/23 0858 07/13/23 0306  WBC 8.9 7.7  HGB 8.7* 9.0*  HCT 27.6* 29.9*  MCV 79.8* 81.5  PLT 369 372   Basic Metabolic Panel Recent Labs    04/19/70 0858 07/13/23 0306  NA 139 141  K 3.8 4.4  CL 104 105  CO2 26 27  GLUCOSE 132* 112*  BUN 56* 46*  CREATININE 2.21* 2.38*  CALCIUM 8.4* 8.3*   Liver Function Tests No results for input(s): "AST", "ALT", "ALKPHOS", "BILITOT", "PROT", "ALBUMIN" in the last 72 hours.   No results for input(s): "LIPASE", "AMYLASE" in the last 72 hours. Cardiac Enzymes No results for input(s): "CKTOTAL", "CKMB", "CKMBINDEX", "TROPONINI" in the last 72 hours.  BNP: BNP (last 3 results) Recent Labs    03/09/23 1544 03/11/23 0218  06/27/23 1116  BNP 660.9* 491.3* 646.4*   Imaging   No results found.   Medications:   Scheduled Medications:  amiodarone  200 mg Oral BID   atorvastatin  80 mg Oral Daily   Chlorhexidine Gluconate Cloth  6 each Topical Daily   ezetimibe  10 mg Oral Daily   feeding supplement  237 mL Oral TID WC & HS   hydrALAZINE  50 mg Oral Q8H   lidocaine  1 patch Transdermal Q24H   multivitamin with minerals  1 tablet Oral Daily   pantoprazole  40 mg Oral BID   sertraline  50 mg Oral QHS   sodium chloride flush  10-40 mL Intracatheter Q12H   sodium chloride flush  3 mL Intravenous Q12H    torsemide  40 mg Oral Daily   Infusions:  piperacillin-tazobactam (ZOSYN)  IV Stopped (07/13/23 0513)    PRN Medications: acetaminophen, docusate sodium, feeding supplement, HYDROmorphone (DILAUDID) injection, influenza vaccine adjuvanted, ondansetron (ZOFRAN) IV, mouth rinse, oxyCODONE, pneumococcal 20-valent conjugate vaccine, polyethylene glycol, sodium chloride flush, sodium chloride flush  Patient Profile   Billy [name] is a 73 yo male with PMH of HTN, HLD, prior DVT, CVA, paroxysmal atrial fibrillation, CKD stage IIIb, carotid artery disease s/p endarterectomy, and chronic diastolic CHF. Admitted following out of hospital witnessed cardiac arrest.   Assessment/Plan   1. Cardiac arrest:  - Witnessed arrest with bystander then EMS CPR.  >20 minutes CPR.  He was shocked for VF.  Had additional 4-5 minutes PEA arrest in ER. ?PE with aspiration given bilateral DVTs.  - suspect due to PE. Cath stable CAD - VQ and LE/UE u/s + DVT  - No chest CT with renal failure - No change  2. Shock, cardiogenic +/- septic component: Initial echo: EF 45%, diffuse HK, severe LVH, normal RV size with mod decreased systolic function, small pericardial effusion, dilated IVC.  Suspicion for PE as above.  PNA on CXR, initial hypotension thought to be due to septic shock.  He is now off pressors.  - Started on Zosyn/Linezolid 03/23 with rising co-ox, WBCs and productive cough. Has now completed linezolid. Stop date today  3. Acute HF with mid range EF: With prominent RV failure. Initial echo: EF 45%, diffuse HK, severe LVH, normal RV size with mod decreased systolic function, small pericardial effusion, dilated IVC. As above, worry for PE with RV failure. Baseline cardiomyopathy with severe LVH raises concern for cardiac amyloidosis.  TEE 03/13: EF 55%, severe LVH with mild RV dysfunction, small IVC.  - L/RHC 07/05/23: 3v CAD with CTO RCA with L->R collaterals and mod non-obstructive CAD in L system. Mild PAH  with severe RV failure though CO is preserved.   - cMRI completed 3/21  LVEF 525 RVEF 53%, not consistent with cardiac amyloid.  - Cardiac output ok - Continue torsemide. Volume status improving  4. Venous thromboembolism: Bilateral upper and lower DVTs, mixed acute and chronic.  He is not anticoagulated at home.  With RV failure on echo, strong suspicion for PE.  - s/p infrarenal IVC placement 3/16.   - V/Q 3/17 with multiple perfusion defects consistent with CXR findings. - No CTA with AKI.  - BUE [place] with SVTs and DVT.  - Eliquis restarted yesterday but developed recurrent hematochezia. Hgb stable.  - Will follow clinically. If no further bleeding will rechallenge. May need colonoscopy.   5. CAD: NSTEMI with HS-TnI 3045.  ?Demand ischemia from arrest vs ACS.  He has history of carotid disease. -  L/RHC 07/05/23: 3v CAD with CTO RCA with L->R collaterals and moderate non-obstructive CAD in L system. Mild PAH with severe RV failure though CO is preserved.   - Plavix stopped with GI bleeding.  - Atorvastatin 80 daily.  - No s/s angina  6. Carotid stenosis: h/o CVA.  TCAR in 6/24.  Okay to remain off plavix at this point    7. Persistent atrial fibrillation: He was not on AC at home though has history of prior CVA.  AC stopped due to prior GI bleeds. He went into AF/RVR here, tolerated poorly.  TEE/DCCV on 03/13.   - Maintaining NSR. Continue po amio - Management of AC as above  8. AKI on CKD 3b: Suspect ATN in setting of hypotension/arrest.  Baseline Scr seems to be around 2.1. - Scr 2.4 continue to follow  9. ID: PCT 8.37 on 03/11, suspect PNA.   - He has completed course of ceftriaxone/doxycycline.  - Abx restarted 03/23 as above. Concern for aspiration PNA.  - Zosyn stops today  10. GI bleeding: Hematochezia with AC.  Had this in the past with [facility] as well.  GI following.  Has had transfusion here. EGD showed bleeding friable duodenal mucosa treated with hemostatic spray.  Had  recurrent bleeding 3/20. Heparin held. CT angio of abdomen showed no active bleeding. - Eliquis restarted yesterday but developed recurrent hematochezia. Hgb stable.  - Will follow clinically. If no further bleeding will rechallenge. May need colonoscopy.   12. Pleural Effusions - s/p CT placement per CCM 3/20  - transudative - Left chest tube removed 3/23 - Right CT still draining, PCCM following. Possible removal today  13. Severe deconditioning - PT/OT - Not a candidate for CIR as family unable to help once he's home. Plan for SNF at discharge.   14. Severe protein-calorie malnutrition - Dietician consulted  15. GOC - multiple markers of poor prognosis - Palliative care consulted. - plan for SNF at discharge  Arvilla Meres, MD  10:19 AM
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
## 2023-07-14 DIAGNOSIS — I469 Cardiac arrest, cause unspecified: Secondary | ICD-10-CM | POA: Diagnosis not present

## 2023-07-14 DIAGNOSIS — T68XXXA Hypothermia, initial encounter: Secondary | ICD-10-CM | POA: Diagnosis not present

## 2023-07-14 DIAGNOSIS — K921 Melena: Secondary | ICD-10-CM | POA: Diagnosis not present

## 2023-07-14 DIAGNOSIS — R9431 Abnormal electrocardiogram [ECG] [EKG]: Secondary | ICD-10-CM | POA: Diagnosis not present

## 2023-07-14 LAB — BASIC METABOLIC PANEL WITH GFR
Anion gap: 8 (ref 5–15)
BUN: 39 mg/dL — ABNORMAL HIGH (ref 8–23)
CO2: 27 mmol/L (ref 22–32)
Calcium: 7.9 mg/dL — ABNORMAL LOW (ref 8.9–10.3)
Chloride: 105 mmol/L (ref 98–111)
Creatinine, Ser: 2.42 mg/dL — ABNORMAL HIGH (ref 0.61–1.24)
GFR, Estimated: 28 mL/min — ABNORMAL LOW (ref >60)
Glucose, Bld: 104 mg/dL — ABNORMAL HIGH (ref 70–99)
Potassium: 3.8 mmol/L (ref 3.5–5.1)
Sodium: 140 mmol/L (ref 135–145)

## 2023-07-14 LAB — HEPARIN LEVEL (UNFRACTIONATED)
Heparin Unfractionated: <0.1 [IU]/mL — ABNORMAL LOW (ref 0.30–0.70)
Heparin Unfractionated: <0.1 [IU]/mL — ABNORMAL LOW (ref 0.30–0.70)

## 2023-07-14 LAB — CBC
HCT: 26.8 % — ABNORMAL LOW (ref 39.0–52.0)
Hemoglobin: 8.2 g/dL — ABNORMAL LOW (ref 13.0–17.0)
MCH: 24.8 pg — ABNORMAL LOW (ref 26.0–34.0)
MCHC: 30.6 g/dL (ref 30.0–36.0)
MCV: 81 fL (ref 80.0–100.0)
Platelets: 335 10*3/uL (ref 150–400)
RBC: 3.31 MIL/uL — ABNORMAL LOW (ref 4.22–5.81)
RDW: 18.9 % — ABNORMAL HIGH (ref 11.5–15.5)
WBC: 5.9 10*3/uL (ref 4.0–10.5)
nRBC: 0 % (ref 0.0–0.2)

## 2023-07-14 MED ORDER — BARRIER CREAM NON-SPECIFIED
1.0000 | TOPICAL_CREAM | Freq: Two times a day (BID) | TOPICAL | Status: DC | PRN
  Administered 2023-07-14: 1 via TOPICAL

## 2023-07-14 MED ORDER — HEPARIN (PORCINE) 25000 UT/250ML-% IV SOLN
1600.0000 [IU]/h | INTRAVENOUS | Status: DC
  Administered 2023-07-14: 800 [IU]/h via INTRAVENOUS
  Administered 2023-07-15: 1000 [IU]/h via INTRAVENOUS
  Administered 2023-07-16: 1400 [IU]/h via INTRAVENOUS
  Filled 2023-07-14 (×4): qty 250
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
## 2023-07-14 NOTE — Care Management Important Message (Signed)
 Important Message  Patient Details  Name: Billy [name] MRN: [id number] Date of Birth: 10-Mar-1951   Important Message Given:  Yes - Medicare IM     Dorena Bodo 07/14/2023, 2:23 PM
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
## 2023-07-14 NOTE — Progress Notes (Signed)
 Daily Progress Note   Patient Name: Billy [name]       Date: 07/14/2023 DOB: Jul 02, 1950  Age: 73 y.o. MRN#: [id number] Attending Physician: Cathren Harsh, MD Primary Care Physician: [name], MD Admit Date: 06/26/2023  Reason for Consultation/Follow-up: Establishing goals of care  Subjective: Feeling okay, feels appetite is somewhat improved  Length of Stay: 18  Current Medications: Scheduled Meds:   amiodarone  200 mg Oral BID   atorvastatin  80 mg Oral Daily   Chlorhexidine Gluconate Cloth  6 each Topical Daily   ezetimibe  10 mg Oral Daily   feeding supplement  237 mL Oral TID WC & HS   hydrALAZINE  50 mg Oral Q8H   lidocaine  1 patch Transdermal Q24H   multivitamin with minerals  1 tablet Oral Daily   pantoprazole  40 mg Oral BID   sertraline  50 mg Oral QHS   sodium chloride flush  10-40 mL Intracatheter Q12H   sodium chloride flush  3 mL Intravenous Q12H    Continuous Infusions:  heparin 800 Units/hr (07/14/23 1328)   piperacillin-tazobactam (ZOSYN)  IV 3.375 g (07/14/23 1234)    PRN Meds: acetaminophen, barrier cream, docusate sodium, feeding supplement, HYDROmorphone (DILAUDID) injection, influenza vaccine adjuvanted, ondansetron (ZOFRAN) IV, mouth rinse, oxyCODONE, pneumococcal 20-valent conjugate vaccine, polyethylene glycol, sodium chloride flush, sodium chloride flush  Physical Exam Constitutional:      General: He is not in acute distress.    Appearance: He is ill-appearing.  Pulmonary:     Effort: Pulmonary effort is normal.  Skin:    General: Skin is warm and dry.             Vital Signs: BP 138/61 (BP Location: Left Arm)   Pulse 63   Temp 98.7 F (37.1 C) (Oral)   Resp 17   Ht 6\' 1"  (1.854 m)   Wt 76.6 kg   SpO2 97%   BMI 22.28 kg/m  SpO2: SpO2: 97  % O2 Device: O2 Device: Room Air O2 Flow Rate: O2 Flow Rate (L/min): 1 L/min  Intake/output summary:  Intake/Output Summary (Last 24 hours) at 07/14/2023 1402 Last data filed at 07/14/2023 0900 Gross per 24 hour  Intake 742.43 ml  Output 3000 ml  Net -2257.57 ml   LBM: Last BM Date : 07/13/23 Baseline Weight: Weight: 80.3 kg Most recent weight: Weight: 76.6 kg       Palliative Assessment/Data: PPS 40%      Patient Active Problem List   Diagnosis Date Noted   Lobar pneumonia (HCC) 07/10/2023   Need for management of chest tube 07/10/2023   Pleural effusion on right 07/10/2023   Aspiration pneumonia of right upper lobe (HCC) 07/09/2023   Pleural effusion 07/06/2023   Cardiac arrest (HCC) 06/26/2023   Gastritis 03/12/2023   Acute on chronic diastolic CHF (congestive heart failure) (HCC) 03/10/2023   Carotid artery stenosis 10/13/2022   Carotid artery stenosis, asymptomatic, right 10/13/2022   Asymptomatic carotid artery stenosis, left 07/28/2022   Biceps tendonitis on right    Tear of right supraspinatus tendon    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    PAF (paroxysmal  atrial fibrillation) (HCC) 09/13/2018   Upper GI bleeding 07/27/2018   History of non-ST elevation myocardial infarction (NSTEMI) 06/15/2018   AKI (acute kidney injury) (HCC) 04/11/2018   Gastrointestinal hemorrhage with melena 04/11/2018   Acute blood loss anemia 04/11/2018   Metabolic acidosis, normal anion gap (NAG) 04/11/2018   Carotid artery disease (HCC) 01/17/2018   Hyperlipidemia 01/17/2018   Peripheral neuropathy 12/25/2017   Hypertension 12/12/2017   Transient neurologic deficit 12/12/2017   History of CVA (cerebrovascular accident) 12/12/2017   Transient ischemic attack    Hypertensive 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 CHF 04/20/2017   Acute DVT (deep venous thrombosis) (HCC) 04/20/2017   Hematuria 04/20/2017   CKD stage 3b, GFR 30-44 ml/min (HCC)  04/20/2017    Palliative Care Assessment & Plan   HPI: 73 y/o male with h/o CHF, CKD stage IV, Atrial Fibrillation, BPH, Anemia, carotid artery disease s/p endarterectomy and stenting, chornic rhinitis, HLD and RLS who went to his neighbors house today after having SOB for at least one week and had a cardiac arrest. The Palliative care team has been asked to support additional goals of care conversations.   Assessment: Received message from RD about concerns of poor nutritional status, possible need for cortrak.   Received update from RN - today appetite has improved some - ate 2 bowls of cheerios for breakfast and a decent portion of lunch.  In conversation with patient he shares he feels appetite is improving and shares his commitment to slowly increase his intake as he is able. We discussed option of cortrak for extra nutrition/support during this time - he does not refuse this option but makes it clear he would like to avoid this option if possible; especially now feeling his intake is improving. He says he would be open to more conversation about it if the medical team deemed a cortrak necessary for his improvement.  We also discussed option of medications that can support appetite, some that may help with mood as well. He is not interested in adding medications to his regimen at this time.  We discussed advance care planning. Mr. Castrogiovanni shares this is difficult for him as he like to "take one day at a time". We discussed code status as well. Discussed what full code means and what DNR means; how each would affect his care. Mr. Viets shares that DNR sounds more in line with what he would want however he wants to make sure his son agrees with this decision. I offer to call son and he agrees however son was unable to answer today. Will continue discussions with Mr Scalisi and attempt conversation with son.   Recommendations/Plan: Patient very hopeful to hold off on cortrak, shares commitment to  increasing intake and feels his appetite is improving Declines trial of appetite stimulants and/or medications to support mood Patient agrees DNR/DNI more in line with his goals but would like sons opinion as well before making change - son did not answer call; will continue attempts  Code Status: Full code  Care plan was discussed with patient, attempted to call son; no return yet  Thank you for allowing the Palliative Medicine Team to assist in the care of this patient.   Total Time 50 minutes Prolonged Time Billed  no   Time spent includes: Detailed review of medical records (labs, imaging, vital signs), medically appropriate exam, discussion with treatment team, counseling and educating patient, family and/or staff, documenting clinical information, medication management and coordination of care.     *Please  note that this is a verbal dictation therefore any spelling or grammatical errors are due to the "[employer]agon [employer] One" system interpretation.  Gerlean Ren, DNP, [facility] Palliative Medicine Team Team Phone # [phone]  Pager [phone]

## 2023-07-14 NOTE — Plan of Care (Signed)
  Problem: Fluid Volume: Goal: Ability to maintain a balanced intake and output will improve Outcome: Progressing   Problem: Safety: Goal: Ability to remain free from injury will improve Outcome: Progressing   Problem: Education: Goal: Understanding of CV disease, CV risk reduction, and recovery process will improve Outcome: Progressing   Problem: Coping: Goal: Ability to adjust to condition or change in health will improve Outcome: Not Progressing   Problem: Health Behavior/Discharge Planning: Goal: Ability to manage health-related needs will improve Outcome: Not Progressing   Problem: Skin Integrity: Goal: Risk for impaired skin integrity will decrease Outcome: Not Progressing   Problem: Education: Goal: Knowledge of General Education information will improve Description: Including pain rating scale, medication(s)/side effects and non-pharmacologic comfort measures Outcome: Not Progressing   Problem: Activity: Goal: Risk for activity intolerance will decrease Outcome: Not Progressing   Problem: Activity: Goal: Ability to return to baseline activity level will improve Outcome: Not Progressing
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
## 2023-07-14 NOTE — Progress Notes (Signed)
 Patient ID: Billy [name], male   DOB: [birth year], 73 y.o.   MRN: [id number]     Advanced Heart Failure Rounding Note  Cardiologist: Nanetta Batty, MD   Chief Complaint: RV failure  Subjective:    Had recurrent hematochezia. Off heparin now. Hgb 9.0 -> 8.7 -> 8.2  Remains on po torsemide. Diuresed 3L yesterday. Scr 2.2 -> 2.38 > 2.42  Remains weak. Has walked a bit with PT.  Right chest tube out    Events  -  3/10 admitted with OOH cardiac arrest. 20 min CPR. Shocked for VF - In/out AF -> IV amio - Echo EF 45% with RV dysfunction -> suspect PE. No CT with AKI - U/s upper and lower extremities with DVTs - V/Q 3/17 with multiple perfusion defects consistent with CXR findings. ->  IVC filter placed due to GIB - EGD (3/15) showed bleeding friable duodenal mucosa treated with hemostatic spray. No overt GI bleeding today, hgb 8.9.  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 Mild PAH with severe RV failure though cardiac output is preserved. Hep gtt restarted post-cath - Recurrent GIB 3/20. CT angio of abdomen showed no active bleeding. + large bilateral effusion - Bilateral CTs placed 3/20 (transudative)  - L chest tube removed 3/23 Objective:   Weight Range: 76.6 kg Body mass index is 22.28 kg/m.   Vital Signs: Temp:  [97.6 F (36.4 C)-98 F (36.7 C)] 97.8 F (36.6 C) (03/28 0736) Pulse Rate:  [69-80] 69 (03/27 2349) Resp:  [18-22] 20 (03/28 0736) BP: (117-163)/(46-66) 130/66 (03/28 0736) SpO2:  [94 %-97 %] 95 % (03/28 0415) Weight:  [76.6 kg] 76.6 kg (03/28 0415) Last BM Date : 07/13/23  Weight change: Filed Weights   07/12/23 0400 07/13/23 0037 07/14/23 0415  Weight: 81.4 kg (P) 76.9 kg 76.6 kg   Intake/Output:  Intake/Output Summary (Last 24 hours) at 07/14/2023 0851 Last data filed at 07/14/2023 0700 Gross per 24 hour  Intake 1318.15 ml  Output 3310 ml  Net -1991.85 ml    Physical Exam  General:  Weak  appearing. No resp difficulty  HEENT: normal Neck: supple. no JVD. Carotids 2+ bilat; no bruits. No lymphadenopathy or thryomegaly appreciated. Cor: PMI nondisplaced. Regular rate & rhythm. 2/6 TR Lungs: decreased at bases  Abdomen: soft, nontender, nondistended. No hepatosplenomegaly. No bruits or masses. Good bowel sounds. Extremities: no cyanosis, clubbing, rash, 1-2+ edema Neuro: alert & orientedx3, cranial nerves grossly intact. moves all 4 extremities w/o difficulty. Affect pleasant   Telemetry   NSR 60-70s Personally reviewed   Labs    CBC Recent Labs    07/13/23 0306 07/13/23 1836 07/14/23 0305  WBC 7.7  --  5.9  HGB 9.0* 8.7* 8.2*  HCT 29.9* 28.1* 26.8*  MCV 81.5  --  81.0  PLT 372  --  335   Basic Metabolic Panel Recent Labs    13/24/40 0306 07/14/23 0305  NA 141 140  K 4.4 3.8  CL 105 105  CO2 27 27  GLUCOSE 112* 104*  BUN 46* 39*  CREATININE 2.38* 2.42*  CALCIUM 8.3* 7.9*   Liver Function Tests No results for input(s): "AST", "ALT", "ALKPHOS", "BILITOT", "PROT", "ALBUMIN" in the last 72 hours.   No results for input(s): "LIPASE", "AMYLASE" in the last 72 hours. Cardiac Enzymes No results for input(s): "CKTOTAL", "CKMB", "CKMBINDEX", "TROPONINI" in the last 72 hours.  BNP: BNP (last 3 results) Recent Labs  03/09/23 1544 03/11/23 0218 06/27/23 1116  BNP 660.9* 491.3* 646.4*   Imaging   No results found.   Medications:   Scheduled Medications:  amiodarone  200 mg Oral BID   atorvastatin  80 mg Oral Daily   Chlorhexidine Gluconate Cloth  6 each Topical Daily   ezetimibe  10 mg Oral Daily   feeding supplement  237 mL Oral TID WC & HS   hydrALAZINE  50 mg Oral Q8H   lidocaine  1 patch Transdermal Q24H   multivitamin with minerals  1 tablet Oral Daily   pantoprazole  40 mg Oral BID   sertraline  50 mg Oral QHS   sodium chloride flush  10-40 mL Intracatheter Q12H   sodium chloride flush  3 mL Intravenous Q12H   torsemide  40 mg  Oral Daily   Infusions:  piperacillin-tazobactam (ZOSYN)  IV 3.375 g (07/14/23 0537)    PRN Medications: acetaminophen, barrier cream, docusate sodium, feeding supplement, HYDROmorphone (DILAUDID) injection, influenza vaccine adjuvanted, ondansetron (ZOFRAN) IV, mouth rinse, oxyCODONE, pneumococcal 20-valent conjugate vaccine, polyethylene glycol, sodium chloride flush, sodium chloride flush  Patient Profile   Billy [name] is a 73 yo male with PMH of HTN, HLD, prior DVT, CVA, paroxysmal atrial fibrillation, CKD stage IIIb, carotid artery disease s/p endarterectomy, and chronic diastolic CHF. Admitted following out of hospital witnessed cardiac arrest.   Assessment/Plan   1. Cardiac arrest:  - Witnessed arrest with bystander then EMS CPR.  >20 minutes CPR.  He was shocked for VF.  Had additional 4-5 minutes PEA arrest in ER. ?PE with aspiration given bilateral DVTs.  - suspect due to PE. Cath stable CAD - VQ and LE/UE u/s + DVT  - No chest CT with renal failure - No change  2. Shock, cardiogenic +/- septic component: Initial echo: EF 45%, diffuse HK, severe LVH, normal RV size with mod decreased systolic function, small pericardial effusion, dilated IVC.  Suspicion for PE as above.  PNA on CXR, initial hypotension thought to be due to septic shock.  He is now off pressors.  - Started on Zosyn/Linezolid 03/23 with rising co-ox, WBCs and productive cough. Has now completed linezolid. Planning 7 days Zosyn   3. Acute HF with mid range EF: With prominent RV failure. Initial echo: EF 45%, diffuse HK, severe LVH, normal RV size with mod decreased systolic function, small pericardial effusion, dilated IVC. As above, worry for PE with RV failure. Baseline cardiomyopathy with severe LVH raises concern for cardiac amyloidosis.  TEE 03/13: EF 55%, severe LVH with mild RV dysfunction, small IVC.  - L/RHC 07/05/23: 3v CAD with CTO RCA with L->R collaterals and mod non-obstructive CAD in L system. Mild PAH  with severe RV failure though CO is preserved.   - cMRI completed 3/21  LVEF 525 RVEF 53%, not consistent with cardiac amyloid.  - Cardiac output ok - CVP low. Scr starting to bump. Hold torsemide for now. Can restart at 20 daily next week  4. Venous thromboembolism: Bilateral upper and lower DVTs, mixed acute and chronic.  He is not anticoagulated at home.  With RV failure on echo, strong suspicion for PE.  - s/p infrarenal IVC placement 3/16.   - V/Q 3/17 with multiple perfusion defects consistent with CXR findings. - No CTA with AKI.  - BUE [place] with SVTs and DVT.  - Heparin off again due to hematochezia - Given need for lifelong AC in setting of multiple DVT/PEs I think he will need colonoscopy to further  define his risk of AC - Will restart heparin at low therapeutic level. If rebleeds, I would be In favor to pursuing colonoscopy. GI following. Appreciate their input   5. CAD: NSTEMI with HS-TnI 3045.  ?Demand ischemia from arrest vs ACS.  He has history of carotid disease. - L/RHC 07/05/23: 3v CAD with CTO RCA with L->R collaterals and moderate non-obstructive CAD in L system. Mild PAH with severe RV failure though CO is preserved.   - Plavix stopped with GI bleeding.  - Atorvastatin 80 daily.  - No s/s angina  6. Carotid stenosis: h/o CVA.  TCAR in 6/24.  Okay to remain off plavix at this point    7. Persistent atrial fibrillation: He was not on AC at home though has history of prior CVA.  AC stopped due to prior GI bleeds. He went into AF/RVR here, tolerated poorly.  TEE/DCCV on 03/13.   - Maintaining NSR Continue po amio - Management of AC as above  8. AKI on CKD 3b: Suspect ATN in setting of hypotension/arrest.  Baseline Scr seems to be around 2.1. - Scr 2.4 continue to follow  9. ID: PCT 8.37 on 03/11, suspect PNA.   - He has completed course of ceftriaxone/doxycycline.  - Abx restarted 03/23 as above. Concern for aspiration PNA.  - Zosyn x 7 days  10. Acute GI bleeding:  Hematochezia with AC.  Had this in the past with [facility] as well.  GI following.  Has had transfusion here. EGD showed bleeding friable duodenal mucosa treated with hemostatic spray.  Had recurrent bleeding 3/20. Heparin held. CT angio of abdomen showed no active bleeding. - Heparin off again due to hematochezia - Given need for lifelong AC in setting of multiple DVT/PEs I think he will need colonoscopy to further define his risk of AC - Will restart heparin at low therapeutic level. If rebleeds, I would be In favor to pursuing colonoscopy. GI following. Appreciate their input   12. Pleural Effusions - s/p bilatCT placement per CCM 3/20  - transudative - Left chest tube removed 3/23 - Right CT out 3/27  13. Severe deconditioning - PT/OT - Not a candidate for CIR as family unable to help once he's home. Plan for SNF at discharge.   14. Severe protein-calorie malnutrition - Dietician consulted  15. GOC - multiple markers of poor prognosis - Palliative care consulted. - plan for SNF at discharge - No change  Will restart heparin. Hold torsemide. Strongly favor colonoscopy to evaluate risk of ongoing bleeding in setting of the need for lifelong AC.   HF team will see again Monday unless called over the weekend.    Arvilla Meres, MD  8:51 AM

## 2023-07-14 NOTE — Progress Notes (Signed)
 [facility]                                                                              Billy [name], is a 73 y.o. male, DOB - [birth year], ZOX:[id number] Admit date - 06/26/2023    Outpatient Primary MD for the patient is [name], MD  LOS - 18  days  Chief Complaint  Patient presents with   Cardiac Arrest       Brief summary  Patient is a 73 y/o male with h/o CHF, CKD stage IV, Atrial Fibrillation, BPH, Anemia, carotid artery disease s/p endarterectomy and stenting, chornic rhinitis, HLD and RLS who went to his neighbors house after having SOB for at least 1 week and had a cardiac arrest. Neighbor stated CPR immediately and EMS continued . He got ROSC back after about 15 min. Then in the ED he arrested again for about 2-5 min and then achieved ROSC. Patient was admitted to the ICU.    Patient found to have cardiogenic shock and required vasopressors.  Echocardiogram with mild reduction in LV systolic function. Positive bilateral deep vein thrombosis.     03/13 direct current cardioversion for atrial fibrillation. Patient noted to have hematochezia with acute blood loss anemia.   03/15 EGD duodenitis.  03/16 IVC filter placement.  03/17 resume IV heparin for anticoagulation. 03/18 completed antibiotic therapy for pneumonia.  Patient transferred to the floor and [name] assumed care 03/20 recurrent lower gastrointestinal bleeding while on IV heparin. CT abdomen and pelvis with no active signs of bleeding. Positive bilateral pleural effusions, placed bilateral chest tubes.  03/21 no recurrent bleeding, hgb has been stable, 8,3 to 7.7, resumed heparin drip for anticoagulation.  03/22 bowel movement with dark brown red color, no low dose heparin.  03/ 23 left chest tube removed today. Positive right upper lobe infiltrate, consistent with aspiration pneumonia, started on antibiotic therapy.  03/24 continue very weak and deconditioned, but hemodynamically stable.  03/25  transfusing 1 unit PRBC today.  3/26: Overnight had hematochezia with 2 large BMs with clots.  H&H stable, heparin drip held    Assessment & Plan    Acute on chronic diastolic CHF (congestive heart failure) (HCC) -2D echo showed preserved LV systolic function with EF 55%, severe LVH, interventricular septum is flattened in systole and diastole, RV systolic function with severe reduction, RA size with mild enlargement, small pericardial effusion.   -Underwent cardiac cath on 3/19 : 3 vessel coronary artery disease with CTO RCA with L > R collaterals and moderate non obstructive coronary artery disease in the left system.  -Heart failure team following, received IV Lasix on 3/25, started on torsemide on 3/26 -Continue hydralazine.  Spironolactone held -Creatinine 2.4, torsemide held today  Acute on chronic cor pulmonale, pulmonary hypertension, RV failure Status post cardiac arrest, cardiogenic shock (resolved) -Continue hydralazine, torsemide (held today)  - Cardiac MRI not consistent with cardiac amyloid.   Acute respiratory failure with hypoxia due to cardiogenic pulmonary edema Bilateral pleural effusions status post bilateral chest tube placement -Pleural fluid consistent with exudate per Light's criteria, high LDH and high protein ratio. 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 Acute DVT involving the left proximal profunda vein and left proximal femoral vein. Indeterminate deep vein thrombosis involving the left popliteal vein, and left middle and distal femoral vein.  - V/Q scan 3/17 with multiple perfusion defects  -Status post IVC filter on 3/16 -Patient was on IV heparin drip, then developed hematochezia on 3/26 overnight and 3/27, heparin drip placed on hold.  GI  consulted.   -H&H stable, retrial of low-dose IV heparin today     Gastrointestinal hemorrhage with melena, acute blood loss anemia Iron deficiency anemia -Patient has been seen by GI this admission, EGD (Dr Marca Ancona) on 3/15 showed erosive gastropathy with no bleeding and no stigmata of recent bleeding. Bleeding friable duodenal mucosa, treated with hemostatic spray.  CTA was negative for active bleeding. -Iron profile showed FE 29, TIBC 234, percent saturation ratio 12, ferritin 39, will benefit from IV iron -GI consulted, seen by Dr. Bosie Clos, plan for colonoscopy if he continues to bleed early next week   CKD stage 3b, GFR 30-44 ml/min (HCC) -Baseline creatinine 2-2.2 -Creatinine trending up, 2.4 today, torsemide held     Aspiration pneumonia of right upper lobe (HCC) New right  upper lobe infiltrate, consistent with aspiration pneumonia.  Trach culture with rare gram positive cocci in pairs and clusters, no staph aureus or pseudomonas. -Continue IV Zosyn     PAF (paroxysmal atrial fibrillation) (HCC) -Sp direct current cardioversion, patient in and out atrial fibrillation.  -Continue amiodarone -Resuming IV heparin today -Cardiology following    Hypertension Continue hydralazine   Hyperlipidemia Continue statin therapy.      Hypoalbuminemia, severe protein calorie malnutrition, severe deconditioning -Albumin level 1.8 on 3/22 Nutrition Problem: Inadequate oral intake Etiology: acute illness Signs/Symptoms: meal completion < 25% Interventions: Ensure Enlive (each supplement provides 350kcal and 20 grams of protein), Magic cup, MVI, Liberalize Diet, Snacks  Estimated body mass index is 22.28 kg/m as calculated from the following:   Height as of this encounter: 6\' 1"  (1.854 m).   Weight as of this encounter: 76.6 kg.  Code Status: Full code DVT Prophylaxis:  Place TED hose Start: 07/11/23 0919 Place TED hose Start: 07/03/23 0912 SCDs Start: 06/26/23 2124   Level of  Care: Level of care: Progressive Family Communication: Updated patient Disposition Plan:      Remains inpatient appropriate: Will need SNF.  Palliative medicine following for goals of care, overall poor prognosis, advanced age, multiple comorbidities and recurrent GI bleed with anticoagulation.   Procedures:  2D echo Chest tubes  Consultants:     Antimicrobials:   Anti-infectives (From admission, onward)    Start     Dose/Rate Route Frequency Ordered Stop   07/11/23 1045  linezolid (ZYVOX) tablet 600 mg  Status:  Discontinued        600 mg Oral Every 12 hours 07/11/23 0955 07/11/23 1457   07/09/23 1400  piperacillin-tazobactam (ZOSYN) IVPB 3.375 g        3.375 g 12.5 mL/hr over 240 Minutes Intravenous Every 8 hours 07/09/23 1019 07/16/23 1359   07/09/23 1045  linezolid (ZYVOX) IVPB 600 mg  Status:  Discontinued        600 mg 300 mL/hr over 60 Minutes Intravenous Every 12 hours 07/09/23 0946 07/11/23 0955   06/29/23 1000  cefTRIAXone (ROCEPHIN) 2 g in sodium chloride  0.9 % 100 mL IVPB        2 g 200 mL/hr over 30 Minutes Intravenous Every 24 hours 06/28/23 1015 07/01/23 1008   06/28/23 0915  Ampicillin-Sulbactam (UNASYN) 3 g in sodium chloride 0.9 % 100 mL IVPB  Status:  Discontinued        3 g 200 mL/hr over 30 Minutes Intravenous Every 12 hours 06/28/23 0822 06/28/23 1015   06/27/23 0915  cefTRIAXone (ROCEPHIN) 2 g in sodium chloride 0.9 % 100 mL IVPB  Status:  Discontinued        2 g 200 mL/hr over 30 Minutes Intravenous Every 24 hours 06/27/23 0828 06/28/23 0822   06/27/23 0915  doxycycline (VIBRAMYCIN) 100 mg in sodium chloride 0.9 % 250 mL IVPB        100 mg 125 mL/hr over 120 Minutes Intravenous Every 12 hours 06/27/23 0828 07/01/23 2310          Medications  amiodarone  200 mg Oral BID   atorvastatin  80 mg Oral Daily   Chlorhexidine Gluconate Cloth  6 each Topical Daily   ezetimibe  10 mg Oral Daily   feeding supplement  237 mL Oral TID WC & HS   hydrALAZINE   50 mg Oral Q8H   lidocaine  1 patch Transdermal Q24H   multivitamin with minerals  1 tablet Oral Daily   pantoprazole  40 mg Oral BID   sertraline  50 mg Oral QHS   sodium chloride flush  10-40 mL Intracatheter Q12H   sodium chloride flush  3 mL Intravenous Q12H      Subjective:   Billy [name] was seen and examined today.  Still had small amount of red blood in stool this morning.  No fevers or chills, nausea vomiting or acute abdominal pain.  Very deconditioned.  Objective:   Vitals:   07/14/23 0415 07/14/23 0531 07/14/23 0736 07/14/23 1104  BP: (!) 147/54 (!) 155/64 130/66 138/61  Pulse:    63  Resp:   20 17  Temp: 98 F (36.7 C)  97.8 F (36.6 C) 98.7 F (37.1 C)  TempSrc: Oral  Oral Oral  SpO2: 95%   97%  Weight: 76.6 kg     Height:        Intake/Output Summary (Last 24 hours) at 07/14/2023 1248 Last data filed at 07/14/2023 0900 Gross per 24 hour  Intake 742.43 ml  Output 3000 ml  Net -2257.57 ml     Wt Readings from Last 3 Encounters:  07/14/23 76.6 kg  03/27/23 81.2 kg  03/14/23 86 kg   Physical Exam General: Alert and oriented x 3, NAD Cardiovascular: S1 S2 clear, RRR.  Respiratory: Diminished breath sound at the bases Gastrointestinal: Soft, mild diffuse TTP, ND NBS Ext: trace pedal edema bilaterally Neuro: no new deficits Psych: Normal affect    Data Reviewed:  I have personally reviewed following labs    CBC Lab Results  Component Value Date   WBC 5.9 07/14/2023   RBC 3.31 (L) 07/14/2023   HGB 8.2 (L) 07/14/2023   HCT 26.8 (L) 07/14/2023   MCV 81.0 07/14/2023   MCH 24.8 (L) 07/14/2023   PLT 335 07/14/2023   MCHC 30.6 07/14/2023   RDW 18.9 (H) 07/14/2023   LYMPHSABS 4.1 (H) 06/26/2023   MONOABS 0.7 06/26/2023   EOSABS 0.4 06/26/2023   BASOSABS 0.1 06/26/2023     Last metabolic panel Lab Results  Component Value Date   NA 140 07/14/2023   K 3.8 07/14/2023  CL 105 07/14/2023   CO2 27 07/14/2023   BUN 39 (H) 07/14/2023    CREATININE 2.42 (H) 07/14/2023   GLUCOSE 104 (H) 07/14/2023   GFRNONAA 28 (L) 07/14/2023   GFRAA 29 (L) 08/31/2019   CALCIUM 7.9 (L) 07/14/2023   PHOS 3.1 07/01/2023   PROT 5.0 (L) 07/08/2023   ALBUMIN 1.8 (L) 07/08/2023   LABGLOB 2.4 06/27/2023   AGRATIO 1.8 12/03/2015   BILITOT 0.5 07/08/2023   ALKPHOS 97 07/08/2023   AST 23 07/08/2023   ALT 58 (H) 07/08/2023   ANIONGAP 8 07/14/2023    CBG (last 3)  No results for input(s): "GLUCAP" in the last 72 hours.     Coagulation Profile: No results for input(s): "INR", "PROTIME" in the last 168 hours.   Radiology Studies: I have personally reviewed the imaging studies  No results found.      Thad Ranger M.D. [facility] 07/14/2023, 12:48 PM  Available via Epic secure chat 7am-7pm After 7 pm, please refer to night coverage provider listed on amion.

## 2023-07-14 NOTE — Progress Notes (Signed)
 [facility] Progress Note  Billy [name] 73 y.o. 08-28-1950   Subjective: Small amount of red blood in large brown stool this morning per nursing. Denies abdominal pain. Resting in bed.  Objective: Vital signs: Vitals:   07/14/23 0531 07/14/23 0736  BP: (!) 155/64 130/66  Pulse:    Resp:  20  Temp:  97.8 F (36.6 C)  SpO2:    P 66  Physical Exam: Gen: lethargic, thin, no acute distress  HEENT: anicteric sclera CV: RRR Chest: CTA B Abd: diffuse tenderness without guarding, soft, nondistended, +BS Ext: no edema  Lab Results: Recent Labs    07/13/23 0306 07/14/23 0305  NA 141 140  K 4.4 3.8  CL 105 105  CO2 27 27  GLUCOSE 112* 104*  BUN 46* 39*  CREATININE 2.38* 2.42*  CALCIUM 8.3* 7.9*   No results for input(s): "AST", "ALT", "ALKPHOS", "BILITOT", "PROT", "ALBUMIN" in the last 72 hours. Recent Labs    07/13/23 0306 07/13/23 1836 07/14/23 0305  WBC 7.7  --  5.9  HGB 9.0* 8.7* 8.2*  HCT 29.9* 28.1* 26.8*  MCV 81.5  --  81.0  PLT 372  --  335      Assessment/Plan: GI bleed - recurrence of hematochezia on IV Heparin. Bleeding subsiding. Hgb 8.2. Last colonoscopy 2009 per office chart. Watch for further bleeding and if bleeding continues with drop in Hgb then do colonoscopy early next week. Not convinced he will be able to adequately drink a colon prep right now but hopefully as he gets further out from this cardiac arrest he will be more stable to undergo a colonoscopy if needed. Will readdress 07/16/23. Continue supportive care.   Billy [name] 07/14/2023, 7:56 AM  Questions please call [phone] ID: Billy [name], male   DOB: 02-Dec-1950, 73 y.o.   MRN: [id number]

## 2023-07-14 NOTE — Plan of Care (Signed)
  Problem: Skin Integrity: Goal: Risk for impaired skin integrity will decrease Outcome: Progressing   Problem: Clinical Measurements: Goal: Ability to maintain clinical measurements within normal limits will improve Outcome: Progressing   Problem: Nutrition: Goal: Adequate nutrition will be maintained Outcome: Progressing

## 2023-07-15 DIAGNOSIS — Z66 Do not resuscitate: Secondary | ICD-10-CM

## 2023-07-15 DIAGNOSIS — K921 Melena: Secondary | ICD-10-CM | POA: Diagnosis not present

## 2023-07-15 DIAGNOSIS — I480 Paroxysmal atrial fibrillation: Secondary | ICD-10-CM | POA: Diagnosis not present

## 2023-07-15 DIAGNOSIS — I469 Cardiac arrest, cause unspecified: Secondary | ICD-10-CM | POA: Diagnosis not present

## 2023-07-15 DIAGNOSIS — T68XXXA Hypothermia, initial encounter: Secondary | ICD-10-CM | POA: Diagnosis not present

## 2023-07-15 LAB — COOXEMETRY PANEL
Carboxyhemoglobin: 2.4 % — ABNORMAL HIGH (ref 0.5–1.5)
Methemoglobin: <0.7 % (ref 0.0–1.5)
O2 Saturation: 72.1 %
Total hemoglobin: 8.5 g/dL — ABNORMAL LOW (ref 12.0–16.0)

## 2023-07-15 LAB — CBC
HCT: 27.2 % — ABNORMAL LOW (ref 39.0–52.0)
Hemoglobin: 8.3 g/dL — ABNORMAL LOW (ref 13.0–17.0)
MCH: 25.1 pg — ABNORMAL LOW (ref 26.0–34.0)
MCHC: 30.5 g/dL (ref 30.0–36.0)
MCV: 82.2 fL (ref 80.0–100.0)
Platelets: 312 10*3/uL (ref 150–400)
RBC: 3.31 MIL/uL — ABNORMAL LOW (ref 4.22–5.81)
RDW: 19.1 % — ABNORMAL HIGH (ref 11.5–15.5)
WBC: 6.4 10*3/uL (ref 4.0–10.5)
nRBC: 0.3 % — ABNORMAL HIGH (ref 0.0–0.2)

## 2023-07-15 LAB — BASIC METABOLIC PANEL WITH GFR
Anion gap: 9 (ref 5–15)
BUN: 32 mg/dL — ABNORMAL HIGH (ref 8–23)
CO2: 27 mmol/L (ref 22–32)
Calcium: 8.2 mg/dL — ABNORMAL LOW (ref 8.9–10.3)
Chloride: 103 mmol/L (ref 98–111)
Creatinine, Ser: 2.23 mg/dL — ABNORMAL HIGH (ref 0.61–1.24)
GFR, Estimated: 31 mL/min — ABNORMAL LOW (ref >60)
Glucose, Bld: 117 mg/dL — ABNORMAL HIGH (ref 70–99)
Potassium: 3.7 mmol/L (ref 3.5–5.1)
Sodium: 139 mmol/L (ref 135–145)

## 2023-07-15 LAB — HEPARIN LEVEL (UNFRACTIONATED)
Heparin Unfractionated: <0.1 [IU]/mL — ABNORMAL LOW (ref 0.30–0.70)
Heparin Unfractionated: <0.1 [IU]/mL — ABNORMAL LOW (ref 0.30–0.70)

## 2023-07-15 MED ORDER — HYDROCORTISONE 1 % EX CREA
TOPICAL_CREAM | Freq: Two times a day (BID) | CUTANEOUS | Status: DC | PRN
  Filled 2023-07-15 (×2): qty 28

## 2023-07-15 MED ORDER — GERHARDT'S BUTT CREAM
TOPICAL_CREAM | Freq: Two times a day (BID) | CUTANEOUS | Status: DC
  Administered 2023-07-19: 1 via TOPICAL
  Filled 2023-07-15: qty 60
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
## 2023-07-15 NOTE — Progress Notes (Signed)
 [facility] Gastroenterology Progress Note  Billy [name] 73 y.o. 17-Jun-1950   Subjective: Red streaks in stool reported this morning. Denies abdominal pain. Resting in bed.  Objective: Vital signs: Vitals:   07/15/23 0503 07/15/23 0741  BP: (!) 153/62 (!) 151/53  Pulse:  67  Resp:  20  Temp:  (!) 97.5 F (36.4 C)  SpO2:  97%    Physical Exam: Gen: lethargic, thin, no acute distress  HEENT: anicteric sclera CV: RRR Chest: CTA B Abd: minimal periumbilical tenderness without guarding, soft, nondistended, +BS Ext: no edema  Lab Results: Recent Labs    07/14/23 0305 07/15/23 0314  NA 140 139  K 3.8 3.7  CL 105 103  CO2 27 27  GLUCOSE 104* 117*  BUN 39* 32*  CREATININE 2.42* 2.23*  CALCIUM 7.9* 8.2*   No results for input(s): "AST", "ALT", "ALKPHOS", "BILITOT", "PROT", "ALBUMIN" in the last 72 hours. Recent Labs    07/14/23 0305 07/15/23 0314  WBC 5.9 6.4  HGB 8.2* 8.3*  HCT 26.8* 27.2*  MCV 81.0 82.2  PLT 335 312      Assessment/Plan: GI bleed - resolving off anticoagulation but pt will need lifelong anticoagulation per cards due to extensive DVTs. Hgb stable at 8.3. Patient very tenuous and doubt he could undergo colon prep and colonoscopy itself unless it is emergent. Will have my partner readdress early next week for timing of an attempted colonoscopy. Supportive care. [facility] will reevaluate on Monday 07/17/23.   [name] 07/15/2023, 9:36 AM  Questions please call [phone] ID: Billy [name], male   DOB: 10-29-1950, 73 y.o.   MRN: [id number]

## 2023-07-15 NOTE — Progress Notes (Signed)
 .rdrp          [facility]                                                                              Billy [name], is a 73 y.o. male, DOB - Oct 01, 1950, ZOX:[id number] Admit date - 06/26/2023    Outpatient Primary MD for the patient is Billy Joe, MD  LOS - 19  days  Chief Complaint  Patient presents with   Cardiac Arrest       Brief summary  Patient is a 73 y/o male with h/o CHF, CKD stage IV, Atrial Fibrillation, BPH, Anemia, carotid artery disease s/p endarterectomy and stenting, chornic rhinitis, HLD and RLS who went to his neighbors house after having SOB for at least 1 week and had a cardiac arrest. Neighbor stated CPR immediately and EMS continued . He got ROSC back after about 15 min. Then in the ED he arrested again for about 2-5 min and then achieved ROSC. Patient was admitted to the ICU.    Patient found to have cardiogenic shock and required vasopressors.  Echocardiogram with mild reduction in LV systolic function. Positive bilateral deep vein thrombosis.     03/13 direct current cardioversion for atrial fibrillation. Patient noted to have hematochezia with acute blood loss anemia.   03/15 EGD duodenitis.  03/16 IVC filter placement.  03/17 resume IV heparin for anticoagulation. 03/18 completed antibiotic therapy for pneumonia.  Patient transferred to the floor and [name] assumed care 03/20 recurrent lower gastrointestinal bleeding while on IV heparin. CT abdomen and pelvis with no active signs of bleeding. Positive bilateral pleural effusions, placed bilateral chest tubes.  03/21 no recurrent bleeding, hgb has been stable, 8,3 to 7.7, resumed heparin drip for anticoagulation.  03/22 bowel movement with dark brown red color, no low dose heparin.  03/ 23 left chest tube removed today. Positive right upper lobe infiltrate, consistent with aspiration pneumonia, started on antibiotic therapy.  03/24 continue very weak and deconditioned, but hemodynamically stable.   03/25 transfusing 1 unit PRBC today.  3/26: Overnight had hematochezia with 2 large BMs with clots.  H&H stable, heparin drip held    Assessment & Plan    Acute on chronic diastolic CHF (congestive heart failure) (HCC) -2D echo showed preserved LV systolic function with EF 55%, severe LVH, interventricular septum is flattened in systole and diastole, RV systolic function with severe reduction, RA size with mild enlargement, small pericardial effusion.   -Underwent cardiac cath on 3/19 : 3 vessel coronary artery disease with CTO RCA with L > R collaterals and moderate non obstructive coronary artery disease in the left system.  - received IV Lasix on 3/25, started on torsemide on 3/26 -Continue hydralazine.  Spironolactone held -Torsemide held, creatinine improving, CHF team following.    Acute on chronic cor pulmonale, pulmonary hypertension, RV failure Status post cardiac arrest, cardiogenic shock (resolved) -Continue hydralazine, torsemide (held today)  - Cardiac MRI not consistent with cardiac amyloid.   Acute respiratory failure with hypoxia due to cardiogenic pulmonary edema Bilateral pleural effusions status post bilateral chest tube placement -Pleural fluid consistent with exudate per Light's criteria, high LDH and high protein ratio. 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 Acute DVT involving the left proximal profunda vein and left proximal femoral vein. Indeterminate deep vein thrombosis involving the left popliteal vein, and left middle and distal femoral vein.  - V/Q scan 3/17 with multiple perfusion defects  -Status post IVC filter on 3/16 -Patient was on IV heparin drip, then developed hematochezia on 3/26 overnight and 3/27, heparin drip placed on hold.  GI  consulted.   -H&H stable, retrial of low-dose IV heparin again per cardiology on 3/28, H&H stable but still had streaks of blood in stool     Gastrointestinal hemorrhage with melena, acute blood loss anemia Iron deficiency anemia -Patient has been seen by GI this admission, EGD (Dr Marca Ancona) on 3/15 showed erosive gastropathy with no bleeding and no stigmata of recent bleeding. Bleeding friable duodenal mucosa, treated with hemostatic spray.  CTA was negative for active bleeding. -Iron profile showed FE 29, TIBC 234, percent saturation ratio 12, ferritin 39, will benefit from IV iron -GI consulted, seen by Dr. Bosie Clos, plan for colonoscopy if he continues to bleed early next week  -On heparin, H&H stable however still had red streaks in stool this morning  CKD stage 3b, GFR 30-44 ml/min (HCC) -Baseline creatinine 2-2.2 -Creatinine improving   Aspiration pneumonia of right upper lobe (HCC) New right  upper lobe infiltrate, consistent with aspiration pneumonia.  Trach culture with rare gram positive cocci in pairs and clusters, no staph aureus or pseudomonas. -Continue IV Zosyn, will complete today     PAF (paroxysmal atrial fibrillation) (HCC) -Sp direct current cardioversion, patient in and out atrial fibrillation.  -Continue amiodarone -Resuming IV heparin today -Cardiology following    Hypertension Continue hydralazine   Hyperlipidemia Continue statin therapy.      Hypoalbuminemia, severe protein calorie malnutrition, severe deconditioning -Albumin level 1.8 on 3/22 Nutrition Problem: Inadequate oral intake Etiology: acute illness Signs/Symptoms: meal completion < 25% Interventions: Ensure Enlive (each supplement provides 350kcal and 20 grams of protein), Magic cup, MVI, Liberalize Diet, Snacks  Estimated body mass index is 22.51 kg/m as calculated from the following:   Height as of this encounter: 6\' 1"  (1.854 m).   Weight as of this encounter: 77.4 kg.  Code Status:  Full code DVT Prophylaxis:  Place TED hose Start: 07/11/23 0919 Place TED hose Start: 07/03/23 0912 SCDs Start: 06/26/23 2124   Level of Care: Level of care: Progressive Family Communication: Updated patient Disposition Plan:      Remains inpatient appropriate: Will need SNF.  Palliative medicine following for goals of care, overall poor prognosis, advanced age, multiple comorbidities and recurrent GI bleed with anticoagulation.   Procedures:  2D echo Chest tubes  Consultants:     Antimicrobials:   Anti-infectives (From admission, onward)    Start     Dose/Rate Route Frequency Ordered Stop   07/11/23 1045  linezolid (ZYVOX) tablet 600 mg  Status:  Discontinued        600 mg Oral Every 12 hours 07/11/23 0955 07/11/23 1457   07/09/23 1400  piperacillin-tazobactam (ZOSYN) IVPB 3.375 g        3.375 g 12.5 mL/hr over 240 Minutes Intravenous Every 8 hours 07/09/23 1019 07/16/23 1359   07/09/23 1045  linezolid (ZYVOX) IVPB 600 mg  Status:  Discontinued        600 mg  300 mL/hr over 60 Minutes Intravenous Every 12 hours 07/09/23 0946 07/11/23 0955   06/29/23 1000  cefTRIAXone (ROCEPHIN) 2 g in sodium chloride 0.9 % 100 mL IVPB        2 g 200 mL/hr over 30 Minutes Intravenous Every 24 hours 06/28/23 1015 07/01/23 1008   06/28/23 0915  Ampicillin-Sulbactam (UNASYN) 3 g in sodium chloride 0.9 % 100 mL IVPB  Status:  Discontinued        3 g 200 mL/hr over 30 Minutes Intravenous Every 12 hours 06/28/23 0822 06/28/23 1015   06/27/23 0915  cefTRIAXone (ROCEPHIN) 2 g in sodium chloride 0.9 % 100 mL IVPB  Status:  Discontinued        2 g 200 mL/hr over 30 Minutes Intravenous Every 24 hours 06/27/23 0828 06/28/23 0822   06/27/23 0915  doxycycline (VIBRAMYCIN) 100 mg in sodium chloride 0.9 % 250 mL IVPB        100 mg 125 mL/hr over 120 Minutes Intravenous Every 12 hours 06/27/23 0828 07/01/23 2310          Medications  amiodarone  200 mg Oral BID   atorvastatin  80 mg Oral Daily    Chlorhexidine Gluconate Cloth  6 each Topical Daily   ezetimibe  10 mg Oral Daily   feeding supplement  237 mL Oral TID WC & HS   [name] butt cream   Topical BID   hydrALAZINE  50 mg Oral Q8H   lidocaine  1 patch Transdermal Q24H   multivitamin with minerals  1 tablet Oral Daily   pantoprazole  40 mg Oral BID   sertraline  50 mg Oral QHS   sodium chloride flush  10-40 mL Intracatheter Q12H   sodium chloride flush  3 mL Intravenous Q12H      Subjective:   Billy [name] was seen and examined today.  Per patient, still had small streaks of blood in the stool this morning.  Very deconditioned.  No chest pain, shortness of breath, fevers or chills.  Objective:   Vitals:   07/15/23 0314 07/15/23 0452 07/15/23 0503 07/15/23 0741  BP: (!) 150/59  (!) 153/62 (!) 151/53  Pulse: 63   67  Resp: 18   20  Temp: 97.9 F (36.6 C)   (!) 97.5 F (36.4 C)  TempSrc: Oral   Oral  SpO2: 94%   97%  Weight:  77.4 kg    Height:        Intake/Output Summary (Last 24 hours) at 07/15/2023 1037 Last data filed at 07/15/2023 0452 Gross per 24 hour  Intake 1227 ml  Output 950 ml  Net 277 ml     Wt Readings from Last 3 Encounters:  07/15/23 77.4 kg  03/27/23 81.2 kg  03/14/23 86 kg    Physical Exam General: Alert and oriented x 3, NAD, chronically ill appearing Cardiovascular: S1 S2 clear, RRR.  Respiratory: CTAB, no wheezing Gastrointestinal: Soft, right-sided mild TTP, nondistended, NBS Ext: no pedal edema bilaterally Neuro: no new deficits, globally weak Psych: Normal affect    Data Reviewed:  I have personally reviewed following labs    CBC Lab Results  Component Value Date   WBC 6.4 07/15/2023   RBC 3.31 (L) 07/15/2023   HGB 8.3 (L) 07/15/2023   HCT 27.2 (L) 07/15/2023   MCV 82.2 07/15/2023   MCH 25.1 (L) 07/15/2023   PLT 312 07/15/2023   MCHC 30.5 07/15/2023   RDW 19.1 (H) 07/15/2023   LYMPHSABS 4.1 (H) 06/26/2023  MONOABS 0.7 06/26/2023   EOSABS 0.4 06/26/2023    BASOSABS 0.1 06/26/2023     Last metabolic panel Lab Results  Component Value Date   NA 139 07/15/2023   K 3.7 07/15/2023   CL 103 07/15/2023   CO2 27 07/15/2023   BUN 32 (H) 07/15/2023   CREATININE 2.23 (H) 07/15/2023   GLUCOSE 117 (H) 07/15/2023   GFRNONAA 31 (L) 07/15/2023   GFRAA 29 (L) 08/31/2019   CALCIUM 8.2 (L) 07/15/2023   PHOS 3.1 07/01/2023   PROT 5.0 (L) 07/08/2023   ALBUMIN 1.8 (L) 07/08/2023   LABGLOB 2.4 06/27/2023   AGRATIO 1.8 12/03/2015   BILITOT 0.5 07/08/2023   ALKPHOS 97 07/08/2023   AST 23 07/08/2023   ALT 58 (H) 07/08/2023   ANIONGAP 9 07/15/2023    CBG (last 3)  No results for input(s): "GLUCAP" in the last 72 hours.     Coagulation Profile: No results for input(s): "INR", "PROTIME" in the last 168 hours.   Radiology Studies: I have personally reviewed the imaging studies  No results found.      Thad Ranger M.D. [facility] 07/15/2023, 10:37 AM  Available via Epic secure chat 7am-7pm After 7 pm, please refer to night coverage provider listed on amion.

## 2023-07-15 NOTE — Progress Notes (Signed)
 Daily Progress Note   Patient Name: Billy [name]       Date: 07/15/2023 DOB: 11-19-1950  Age: 73 y.o. MRN#: [id number] Attending Physician: Cathren Harsh, MD Primary Care Physician: [name], MD Admit Date: 06/26/2023  Reason for Consultation/Follow-up: Establishing goals of care  Subjective: Feeling okay - same as yesterday, no specific complaints  Length of Stay: 19  Current Medications: Scheduled Meds:   amiodarone  200 mg Oral BID   atorvastatin  80 mg Oral Daily   Chlorhexidine Gluconate Cloth  6 each Topical Daily   ezetimibe  10 mg Oral Daily   feeding supplement  237 mL Oral TID WC & HS   [name] butt cream   Topical BID   hydrALAZINE  50 mg Oral Q8H   lidocaine  1 patch Transdermal Q24H   multivitamin with minerals  1 tablet Oral Daily   pantoprazole  40 mg Oral BID   sertraline  50 mg Oral QHS   sodium chloride flush  10-40 mL Intracatheter Q12H   sodium chloride flush  3 mL Intravenous Q12H    Continuous Infusions:  heparin 1,000 Units/hr (07/15/23 0537)   piperacillin-tazobactam (ZOSYN)  IV 3.375 g (07/15/23 0501)    PRN Meds: acetaminophen, barrier cream, docusate sodium, feeding supplement, hydrocortisone cream, HYDROmorphone (DILAUDID) injection, influenza vaccine adjuvanted, ondansetron (ZOFRAN) IV, mouth rinse, oxyCODONE, pneumococcal 20-valent conjugate vaccine, polyethylene glycol, sodium chloride flush, sodium chloride flush  Physical Exam Constitutional:      General: He is not in acute distress.    Appearance: He is ill-appearing.  Pulmonary:     Effort: Pulmonary effort is normal.  Skin:    General: Skin is warm and dry.  Neurological:     Mental Status: He is oriented to person, place, and time.  Psychiatric:     Comments: A bit withdrawn              Vital Signs: BP (!) 156/70 (BP Location: Left Arm)   Pulse 64   Temp (!) 97.5 F (36.4 C) (Oral)   Resp 18   Ht 6\' 1"  (1.854 m)   Wt 77.4 kg   SpO2 95%   BMI 22.51 kg/m  SpO2: SpO2: 95 % O2 Device: O2 Device: Room Air O2 Flow Rate: O2 Flow Rate (L/min): 1 L/min  Intake/output summary:  Intake/Output Summary (Last 24 hours) at 07/15/2023 1249 Last data filed at 07/15/2023 0452 Gross per 24 hour  Intake 867 ml  Output 950 ml  Net -83 ml   LBM: Last BM Date : 07/14/23 Baseline Weight: Weight: 80.3 kg Most recent weight: Weight: 77.4 kg       Palliative Assessment/Data: PPS 40%      Patient Active Problem List   Diagnosis Date Noted   Lobar pneumonia (HCC) 07/10/2023   Need for management of chest tube 07/10/2023   Pleural effusion on right 07/10/2023   Aspiration pneumonia of right upper lobe (HCC) 07/09/2023   Pleural effusion 07/06/2023   Cardiac arrest (HCC) 06/26/2023   Gastritis 03/12/2023   Acute on chronic diastolic CHF (congestive heart failure) (HCC) 03/10/2023   Carotid artery stenosis 10/13/2022   Carotid artery stenosis, asymptomatic, right 10/13/2022   Asymptomatic  carotid artery stenosis, left 07/28/2022   Biceps tendonitis on right    Tear of right supraspinatus tendon    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    PAF (paroxysmal atrial fibrillation) (HCC) 09/13/2018   Upper GI bleeding 07/27/2018   History of non-ST elevation myocardial infarction (NSTEMI) 06/15/2018   AKI (acute kidney injury) (HCC) 04/11/2018   Gastrointestinal hemorrhage with melena 04/11/2018   Acute blood loss anemia 04/11/2018   Metabolic acidosis, normal anion gap (NAG) 04/11/2018   Carotid artery disease (HCC) 01/17/2018   Hyperlipidemia 01/17/2018   Peripheral neuropathy 12/25/2017   Hypertension 12/12/2017   Transient neurologic deficit 12/12/2017   History of CVA (cerebrovascular accident) 12/12/2017    Transient ischemic attack    Hypertensive 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 CHF 04/20/2017   Acute DVT (deep venous thrombosis) (HCC) 04/20/2017   Hematuria 04/20/2017   CKD stage 3b, GFR 30-44 ml/min (HCC) 04/20/2017    Palliative Care Assessment & Plan   HPI: 73 y/o male with h/o CHF, CKD stage IV, Atrial Fibrillation, BPH, Anemia, carotid artery disease s/p endarterectomy and stenting, chornic rhinitis, HLD and RLS who went to his neighbors house today after having SOB for at least one week and had a cardiac arrest. The Palliative care team has been asked to support additional goals of care conversations.   Assessment: Received return call from patient's son this AM.  Reviewed conversation from yesterday with son. Specifically discussed patient's progress, plan for rehab, difficulties with PO intake, and mood. Son shares his mood is probably close to baseline - has not taken medications in the past for this and likely would not want them.  We also reviewed conversation around code status that patient and I had yesterday; specifically discussed patient shared he would not want resuscitation efforts or to be back on ventilator. Son shares he and patient briefly discussed this a few days ago and he is not surprised by patient's stated wishes. He shares his worries about quality of life. Son agrees to code status change to DNR/DNI. Son is planning to tour SNFs today in planning for discharge.   Visited patient and shared conversation with son. He understands and continues to agree to change code status to DNR/DNI. We reviewed concerns about PO intake - he tells me he ate breakfast but is a little irritable when I try to discuss amount. We briefly discussed ongoing option of using medication to support mood and/or appetite. He again declines; tells me he doesn't feel he needs this.   Recommendations/Plan: Patient very hopeful to hold off on cortrak, shares commitment to increasing intake and feels his  appetite is improving Declines trial of appetite stimulants and/or medications to support mood Patient agrees DNR/DNI more in line with his goals - son agrees to DNR/DNI as well; will change code status Son touring SNFs  Code Status: DNR  Care plan was discussed with patient and son  Thank you for allowing the Palliative Medicine Team to assist in the care of this patient.   Total Time 60 minutes Prolonged Time Billed  no   Time spent includes: Detailed review of medical records (labs, imaging, vital signs), medically appropriate exam, discussion with treatment team, counseling and educating patient, family and/or staff, documenting clinical information, medication management and coordination of care.     *Please note that this is a verbal dictation therefore any spelling or grammatical errors are due to the "Dragon Medical One" system interpretation.  Fabian November  Andi Devon, DNP, [facility] Palliative Medicine Team Team Phone # [phone]  Pager [phone]

## 2023-07-15 NOTE — Progress Notes (Addendum)
 PHARMACY - ANTICOAGULATION CONSULT NOTE  Pharmacy Consult for heparin Indication:  VTE and h/o Afib  Labs: Recent Labs    07/13/23 0306 07/13/23 1836 07/14/23 0305 07/14/23 2039 07/15/23 0314  HGB 9.0* 8.7* 8.2*  --  8.3*  HCT 29.9* 28.1* 26.8*  --  27.2*  PLT 372  --  335  --  312  HEPARINUNFRC <0.10*  --  <0.10* <0.10* <0.10*  CREATININE 2.38*  --  2.42*  --  2.23*   Assessment: 73yo male remains subtherapeutic on heparin after continuing at previous rate given small amount of blood w/ BM, RN reports no further signs of bleeding, Hgb stable.  Goal of Therapy:  Heparin level 0.3-0.5 units/ml   Plan:  Increase heparin infusion by 3 units/kg/hr to 1000 units/hr (previously therapeutic at 1850 units/hr, now w/ slow titration d/t bleeding risk). Check level in 8 hours.   Vernard Gambles, PharmD, BCPS 07/15/2023 5:46 AM     Addendum: RN reports pt had BM w/ no visible blood w/ stool but there was some red on the tissue.  Will continue with current plan.  VB 5:57 AM

## 2023-07-16 DIAGNOSIS — I469 Cardiac arrest, cause unspecified: Secondary | ICD-10-CM | POA: Diagnosis not present

## 2023-07-16 DIAGNOSIS — R9431 Abnormal electrocardiogram [ECG] [EKG]: Secondary | ICD-10-CM | POA: Diagnosis not present

## 2023-07-16 DIAGNOSIS — T68XXXA Hypothermia, initial encounter: Secondary | ICD-10-CM | POA: Diagnosis not present

## 2023-07-16 DIAGNOSIS — K921 Melena: Secondary | ICD-10-CM | POA: Diagnosis not present

## 2023-07-16 LAB — BASIC METABOLIC PANEL WITH GFR
Anion gap: 5 (ref 5–15)
BUN: 26 mg/dL — ABNORMAL HIGH (ref 8–23)
CO2: 25 mmol/L (ref 22–32)
Calcium: 7.6 mg/dL — ABNORMAL LOW (ref 8.9–10.3)
Chloride: 107 mmol/L (ref 98–111)
Creatinine, Ser: 1.91 mg/dL — ABNORMAL HIGH (ref 0.61–1.24)
GFR, Estimated: 37 mL/min — ABNORMAL LOW (ref >60)
Glucose, Bld: 97 mg/dL (ref 70–99)
Potassium: 3.8 mmol/L (ref 3.5–5.1)
Sodium: 137 mmol/L (ref 135–145)

## 2023-07-16 LAB — CBC
HCT: 27.2 % — ABNORMAL LOW (ref 39.0–52.0)
Hemoglobin: 8.1 g/dL — ABNORMAL LOW (ref 13.0–17.0)
MCH: 24.6 pg — ABNORMAL LOW (ref 26.0–34.0)
MCHC: 29.8 g/dL — ABNORMAL LOW (ref 30.0–36.0)
MCV: 82.7 fL (ref 80.0–100.0)
Platelets: 289 10*3/uL (ref 150–400)
RBC: 3.29 MIL/uL — ABNORMAL LOW (ref 4.22–5.81)
RDW: 19.7 % — ABNORMAL HIGH (ref 11.5–15.5)
WBC: 6.8 10*3/uL (ref 4.0–10.5)
nRBC: 0.3 % — ABNORMAL HIGH (ref 0.0–0.2)

## 2023-07-16 LAB — HEPARIN LEVEL (UNFRACTIONATED)
Heparin Unfractionated: <0.1 [IU]/mL — ABNORMAL LOW (ref 0.30–0.70)
Heparin Unfractionated: <0.1 [IU]/mL — ABNORMAL LOW (ref 0.30–0.70)
Heparin Unfractionated: 0.15 [IU]/mL — ABNORMAL LOW (ref 0.30–0.70)

## 2023-07-16 LAB — COOXEMETRY PANEL
Carboxyhemoglobin: 2.1 % — ABNORMAL HIGH (ref 0.5–1.5)
Methemoglobin: 0.8 % (ref 0.0–1.5)
O2 Saturation: 94.9 %
Total hemoglobin: 8.5 g/dL — ABNORMAL LOW (ref 12.0–16.0)
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
## 2023-07-16 NOTE — Plan of Care (Signed)
   Problem: Nutritional: Goal: Maintenance of adequate nutrition will improve Outcome: Progressing   Problem: Nutritional: Goal: Progress toward achieving an optimal weight will improve Outcome: Progressing   Problem: Skin Integrity: Goal: Risk for impaired skin integrity will decrease Outcome: Progressing
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
## 2023-07-16 NOTE — Progress Notes (Signed)
   Palliative Medicine Inpatient Follow Up Note HPI: 73 y/o male with h/o CHF, CKD stage IV, Atrial Fibrillation, BPH, Anemia, carotid artery disease s/p endarterectomy and stenting, chornic rhinitis, HLD and RLS who went to his neighbors house today after having SOB for at least one week and had a cardiac arrest. The Palliative care team has been asked to support additional goals of care conversations.   Today's Discussion 07/16/2023  *Please note that this is a verbal dictation therefore any spelling or grammatical errors are due to the "Dragon [employer] One" system interpretation.  Chart reviewed inclusive of vital signs, progress notes, laboratory results, and diagnostic images.   I met with Billy [name] at bedside this morning - he is resting in bed. He shares concern about "beeping" we discussed that his monitor is going off. I was able to correct this while at bedside. We discussed the need for adequate oral intake which he seemed to be receptive to. He feels in the last two days he has increased his oral intake. He does want to avoid an NGT tube.  Billy [name] shares the decision to change his code status.  Plan for ongoing assessment to see if PO's will improve.   Questions and concerns addressed/Palliative Support Provided.   Objective Assessment: Vital Signs Vitals:   07/16/23 0700 07/16/23 1000  BP: (!) 131/46 (!) 150/67  Pulse: 65 62  Resp: 20   Temp: 98.8 F (37.1 C)   SpO2: 97%     Intake/Output Summary (Last 24 hours) at 07/16/2023 1108 Last data filed at 07/16/2023 0807 Gross per 24 hour  Intake 170 ml  Output 475 ml  Net -305 ml   Last Weight  Most recent update: 07/16/2023  4:38 AM    Weight  75.6 kg (166 lb 10.7 oz)            Gen:  Elderly Caucasian M in NAD HEENT: moist mucous membranes CV: Regular rate and rhythm  PULM:  On RA, breathing is even and nonlabored ABD: soft/nontender  EXT: No edema  Neuro: Alert and oriented x3   SUMMARY OF RECOMMENDATIONS    DNAR/DNI   Living will obtained and will be scanned into Vynca  Continue current care allowing time for outcomes   Ongoing PMT support  Billing based on MDM: Moderate  ______________________________________________________________________________________ Billy [name] Palliative Medicine Team Team Cell Phone: [phone] Please utilize secure chat with additional questions, if there is no response within 30 minutes please call the above phone number  Palliative Medicine Team providers are available by phone from 7am to 7pm daily and can be reached through the team cell phone.  Should this patient require assistance outside of these hours, please call the patient's attending physician.
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
## 2023-07-16 NOTE — Plan of Care (Signed)
  Problem: Nutrition: Goal: Adequate nutrition will be maintained Outcome: Completed/Met   Problem: Elimination: Goal: Will not experience complications related to urinary retention Outcome: Completed/Met   Problem: Nutrition: Goal: Adequate nutrition will be maintained Outcome: Completed/Met   Problem: Elimination: Goal: Will not experience complications related to urinary retention Outcome: Completed/Met

## 2023-07-16 NOTE — Progress Notes (Signed)
 .rdrp          [facility]                                                                              Billy [name], is a 73 y.o. male, DOB - 1950/11/07, EXB:[id number] Admit date - 06/26/2023    Outpatient Primary MD for the patient is Billy Joe, MD  LOS - 20  days  Chief Complaint  Patient presents with   Cardiac Arrest       Brief summary  Patient is a 73 y/o male with h/o CHF, CKD stage IV, Atrial Fibrillation, BPH, Anemia, carotid artery disease s/p endarterectomy and stenting, chornic rhinitis, HLD and RLS who went to his neighbors house after having SOB for at least 1 week and had a cardiac arrest. Neighbor stated CPR immediately and EMS continued . He got ROSC back after about 15 min. Then in the ED he arrested again for about 2-5 min and then achieved ROSC. Patient was admitted to the ICU.    Patient found to have cardiogenic shock and required vasopressors.  Echocardiogram with mild reduction in LV systolic function. Positive bilateral deep vein thrombosis.     03/13 direct current cardioversion for atrial fibrillation. Patient noted to have hematochezia with acute blood loss anemia.   03/15 EGD duodenitis.  03/16 IVC filter placement.  03/17 resume IV heparin for anticoagulation. 03/18 completed antibiotic therapy for pneumonia.  Patient transferred to the floor and [name] assumed care 03/20 recurrent lower gastrointestinal bleeding while on IV heparin. CT abdomen and pelvis with no active signs of bleeding. Positive bilateral pleural effusions, placed bilateral chest tubes.  03/21 no recurrent bleeding, hgb has been stable, 8,3 to 7.7, resumed heparin drip for anticoagulation.  03/22 bowel movement with dark brown red color, no low dose heparin.  03/ 23 left chest tube removed today. Positive right upper lobe infiltrate, consistent with aspiration pneumonia, started on antibiotic therapy.  03/24 continue very weak and deconditioned, but hemodynamically stable.   03/25 transfusing 1 unit PRBC today.  3/26: Overnight had hematochezia with 2 large BMs with clots.  H&H stable, heparin drip held    Assessment & Plan    Acute on chronic diastolic CHF (congestive heart failure) (HCC) -2D echo showed preserved LV systolic function with EF 55%, severe LVH, interventricular septum is flattened in systole and diastole, RV systolic function with severe reduction, RA size with mild enlargement, small pericardial effusion.   -Underwent cardiac cath on 3/19 : 3 vessel coronary artery disease with CTO RCA with L > R collaterals and moderate non obstructive coronary artery disease in the left system.  - received IV Lasix on 3/25, started on torsemide on 3/26 -Continue hydralazine.  Spironolactone, torsemide held -Creatinine improving, CHF team following -Torsemide held, creatinine improving, CHF team following.    Acute on chronic cor pulmonale, pulmonary hypertension, RV failure Status post cardiac arrest, cardiogenic shock (resolved) -Continue hydralazine, torsemide (held today) - Cardiac MRI not consistent with cardiac amyloid.   Acute respiratory failure with hypoxia due to cardiogenic pulmonary edema Bilateral pleural effusions status post bilateral chest tube placement -Pleural fluid consistent with exudate per Light's criteria, high LDH and high protein  ratio. 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 Acute DVT involving the left proximal profunda vein and left proximal femoral vein. Indeterminate deep vein thrombosis involving the left popliteal vein, and left middle and distal femoral vein.  - V/Q scan 3/17 with multiple perfusion defects  -Status post IVC filter on 3/16 -Patient was on IV heparin drip, then developed hematochezia on 3/26  overnight and 3/27, heparin drip placed on hold.  GI consulted.   - retrial of low-dose IV heparin again per cardiology on 3/28 -H&H stable, no report of bleeding today     Gastrointestinal hemorrhage with melena, acute blood loss anemia Iron deficiency anemia -Patient has been seen by GI this admission, EGD (Dr Marca Ancona) on 3/15 showed erosive gastropathy with no bleeding and no stigmata of recent bleeding. Bleeding friable duodenal mucosa, treated with hemostatic spray.  CTA was negative for active bleeding. -Iron profile showed FE 29, TIBC 234, percent saturation ratio 12, ferritin 39, will benefit from IV iron -GI consulted, seen by Dr. Bosie Clos, plan for colonoscopy if he continues to bleed early next week  -On heparin, H&H stable  CKD stage 3b, GFR 30-44 ml/min (HCC) -Baseline creatinine 2-2.2 -Creatinine improving   Aspiration pneumonia of right upper lobe (HCC) New right  upper lobe infiltrate, consistent with aspiration pneumonia.  Trach culture with rare gram positive cocci in pairs and clusters, no staph aureus or pseudomonas. -Completed IV Zosyn on 3/29     PAF (paroxysmal atrial fibrillation) (HCC) -Sp direct current cardioversion, patient in and out atrial fibrillation.  -Continue amiodarone.  Heparin was resumed on 3/28 -H&H stable, cardiology following   Hypertension Continue hydralazine   Hyperlipidemia Continue statin therapy.      Hypoalbuminemia, severe protein calorie malnutrition, severe deconditioning -Albumin level 1.8 on 3/22 Nutrition Problem: Inadequate oral intake Etiology: acute illness Signs/Symptoms: meal completion < 25% Interventions: Ensure Enlive (each supplement provides 350kcal and 20 grams of protein), Magic cup, MVI, Liberalize Diet, Snacks  Estimated body mass index is 21.99 kg/m as calculated from the following:   Height as of this encounter: 6\' 1"  (1.854 m).   Weight as of this encounter: 75.6 kg.  Code Status: Full code DVT  Prophylaxis:  Place TED hose Start: 07/11/23 0919 Place TED hose Start: 07/03/23 0912 SCDs Start: 06/26/23 2124   Level of Care: Level of care: Progressive Family Communication: Updated patient Disposition Plan:      Remains inpatient appropriate: Will need SNF.  Palliative medicine following for goals of care, overall poor prognosis, advanced age, multiple comorbidities and recurrent GI bleed with anticoagulation.   Procedures:  2D echo Chest tubes  Consultants:     Antimicrobials:   Anti-infectives (From admission, onward)    Start     Dose/Rate Route Frequency Ordered Stop   07/11/23 1045  linezolid (ZYVOX) tablet 600 mg  Status:  Discontinued        600 mg Oral Every 12 hours 07/11/23 0955 07/11/23 1457   07/09/23 1400  piperacillin-tazobactam (ZOSYN) IVPB 3.375 g        3.375 g 12.5 mL/hr over 240 Minutes Intravenous Every 8 hours 07/09/23 1019 07/16/23 0941   07/09/23 1045  linezolid (ZYVOX) IVPB 600 mg  Status:  Discontinued        600 mg 300 mL/hr over 60 Minutes Intravenous  Every 12 hours 07/09/23 0946 07/11/23 0955   06/29/23 1000  cefTRIAXone (ROCEPHIN) 2 g in sodium chloride 0.9 % 100 mL IVPB        2 g 200 mL/hr over 30 Minutes Intravenous Every 24 hours 06/28/23 1015 07/01/23 1008   06/28/23 0915  Ampicillin-Sulbactam (UNASYN) 3 g in sodium chloride 0.9 % 100 mL IVPB  Status:  Discontinued        3 g 200 mL/hr over 30 Minutes Intravenous Every 12 hours 06/28/23 0822 06/28/23 1015   06/27/23 0915  cefTRIAXone (ROCEPHIN) 2 g in sodium chloride 0.9 % 100 mL IVPB  Status:  Discontinued        2 g 200 mL/hr over 30 Minutes Intravenous Every 24 hours 06/27/23 0828 06/28/23 0822   06/27/23 0915  doxycycline (VIBRAMYCIN) 100 mg in sodium chloride 0.9 % 250 mL IVPB        100 mg 125 mL/hr over 120 Minutes Intravenous Every 12 hours 06/27/23 0828 07/01/23 2310          Medications  amiodarone  200 mg Oral BID   atorvastatin  80 mg Oral Daily   Chlorhexidine  Gluconate Cloth  6 each Topical Daily   ezetimibe  10 mg Oral Daily   feeding supplement  237 mL Oral TID WC & HS   [name] butt cream   Topical BID   hydrALAZINE  50 mg Oral Q8H   lidocaine  1 patch Transdermal Q24H   multivitamin with minerals  1 tablet Oral Daily   pantoprazole  40 mg Oral BID   sertraline  50 mg Oral QHS   sodium chloride flush  10-40 mL Intracatheter Q12H   sodium chloride flush  3 mL Intravenous Q12H      Subjective:   Bilbo Spitler was seen and examined today.  No acute complaints per the patient.  No chest pain, shortness of breath, fever chills nausea vomiting.     Objective:   Vitals:   07/16/23 0542 07/16/23 0700 07/16/23 1000 07/16/23 1209  BP: (!) 150/60 (!) 131/46 (!) 150/67 132/61  Pulse:  65 62 61  Resp:  20  18  Temp:  98.8 F (37.1 C)  98.7 F (37.1 C)  TempSrc:  Oral  Oral  SpO2:  97%  96%  Weight:      Height:        Intake/Output Summary (Last 24 hours) at 07/16/2023 1305 Last data filed at 07/16/2023 0807 Gross per 24 hour  Intake 170 ml  Output 475 ml  Net -305 ml     Wt Readings from Last 3 Encounters:  07/16/23 75.6 kg  03/27/23 81.2 kg  03/14/23 86 kg   Physical Exam General: Alert and oriented x 3, NAD Cardiovascular: S1 S2 clear, RRR.  Respiratory: CTAB, no wheezin Gastrointestinal: Soft, right-sided mild TTP, ND, NBS  Ext: no pedal edema bilaterally Neuro: no new deficits Psych: Normal affect    Data Reviewed:  I have personally reviewed following labs    CBC Lab Results  Component Value Date   WBC 6.8 07/16/2023   RBC 3.29 (L) 07/16/2023   HGB 8.1 (L) 07/16/2023   HCT 27.2 (L) 07/16/2023   MCV 82.7 07/16/2023   MCH 24.6 (L) 07/16/2023   PLT 289 07/16/2023   MCHC 29.8 (L) 07/16/2023   RDW 19.7 (H) 07/16/2023   LYMPHSABS 4.1 (H) 06/26/2023   MONOABS 0.7 06/26/2023   EOSABS 0.4 06/26/2023   BASOSABS 0.1 06/26/2023  Last metabolic panel Lab Results  Component Value Date   NA 137 07/16/2023    K 3.8 07/16/2023   CL 107 07/16/2023   CO2 25 07/16/2023   BUN 26 (H) 07/16/2023   CREATININE 1.91 (H) 07/16/2023   GLUCOSE 97 07/16/2023   GFRNONAA 37 (L) 07/16/2023   GFRAA 29 (L) 08/31/2019   CALCIUM 7.6 (L) 07/16/2023   PHOS 3.1 07/01/2023   PROT 5.0 (L) 07/08/2023   ALBUMIN 1.8 (L) 07/08/2023   LABGLOB 2.4 06/27/2023   AGRATIO 1.8 12/03/2015   BILITOT 0.5 07/08/2023   ALKPHOS 97 07/08/2023   AST 23 07/08/2023   ALT 58 (H) 07/08/2023   ANIONGAP 5 07/16/2023    CBG (last 3)  No results for input(s): "GLUCAP" in the last 72 hours.     Coagulation Profile: No results for input(s): "INR", "PROTIME" in the last 168 hours.   Radiology Studies: I have personally reviewed the imaging studies  No results found.      Thad Ranger M.D. [facility] 07/16/2023, 1:05 PM  Available via Epic secure chat 7am-7pm After 7 pm, please refer to night coverage provider listed on amion.

## 2023-07-17 DIAGNOSIS — R9431 Abnormal electrocardiogram [ECG] [EKG]: Secondary | ICD-10-CM | POA: Diagnosis not present

## 2023-07-17 DIAGNOSIS — T68XXXA Hypothermia, initial encounter: Secondary | ICD-10-CM | POA: Diagnosis not present

## 2023-07-17 DIAGNOSIS — I5033 Acute on chronic diastolic (congestive) heart failure: Secondary | ICD-10-CM | POA: Diagnosis not present

## 2023-07-17 DIAGNOSIS — I469 Cardiac arrest, cause unspecified: Secondary | ICD-10-CM | POA: Diagnosis not present

## 2023-07-17 LAB — BASIC METABOLIC PANEL WITH GFR
Anion gap: 6 (ref 5–15)
BUN: 23 mg/dL (ref 8–23)
CO2: 25 mmol/L (ref 22–32)
Calcium: 8.1 mg/dL — ABNORMAL LOW (ref 8.9–10.3)
Chloride: 107 mmol/L (ref 98–111)
Creatinine, Ser: 1.85 mg/dL — ABNORMAL HIGH (ref 0.61–1.24)
GFR, Estimated: 39 mL/min — ABNORMAL LOW (ref >60)
Glucose, Bld: 86 mg/dL (ref 70–99)
Potassium: 4 mmol/L (ref 3.5–5.1)
Sodium: 138 mmol/L (ref 135–145)

## 2023-07-17 LAB — CBC
HCT: 30.5 % — ABNORMAL LOW (ref 39.0–52.0)
Hemoglobin: 9.1 g/dL — ABNORMAL LOW (ref 13.0–17.0)
MCH: 24.9 pg — ABNORMAL LOW (ref 26.0–34.0)
MCHC: 29.8 g/dL — ABNORMAL LOW (ref 30.0–36.0)
MCV: 83.6 fL (ref 80.0–100.0)
Platelets: 307 10*3/uL (ref 150–400)
RBC: 3.65 MIL/uL — ABNORMAL LOW (ref 4.22–5.81)
RDW: 20 % — ABNORMAL HIGH (ref 11.5–15.5)
WBC: 8.4 10*3/uL (ref 4.0–10.5)
nRBC: 0.2 % (ref 0.0–0.2)

## 2023-07-17 LAB — COOXEMETRY PANEL
Carboxyhemoglobin: 2.7 % — ABNORMAL HIGH (ref 0.5–1.5)
Methemoglobin: <0.7 % (ref 0.0–1.5)
O2 Saturation: 74.3 %
Total hemoglobin: 8.8 g/dL — ABNORMAL LOW (ref 12.0–16.0)

## 2023-07-17 LAB — HEPARIN LEVEL (UNFRACTIONATED): Heparin Unfractionated: 0.29 [IU]/mL — ABNORMAL LOW (ref 0.30–0.70)

## 2023-07-17 MED ORDER — TORSEMIDE 20 MG PO TABS
10.0000 mg | ORAL_TABLET | Freq: Every day | ORAL | Status: DC
  Administered 2023-07-17: 10 mg via ORAL
  Filled 2023-07-17: qty 1

## 2023-07-17 MED ORDER — APIXABAN 5 MG PO TABS
5.0000 mg | ORAL_TABLET | Freq: Two times a day (BID) | ORAL | Status: DC
  Administered 2023-07-17 – 2023-07-20 (×7): 5 mg via ORAL
  Filled 2023-07-17 (×7): qty 1

## 2023-07-17 MED ORDER — ALTEPLASE 2 MG IJ SOLR
2.0000 mg | Freq: Once | INTRAMUSCULAR | Status: AC
  Administered 2023-07-17: 2 mg
  Filled 2023-07-17: qty 2

## 2023-07-17 MED ORDER — HYDRALAZINE HCL 50 MG PO TABS
75.0000 mg | ORAL_TABLET | Freq: Three times a day (TID) | ORAL | Status: DC
  Administered 2023-07-17 – 2023-07-18 (×3): 75 mg via ORAL
  Filled 2023-07-17 (×3): qty 1

## 2023-07-17 MED ORDER — POTASSIUM CHLORIDE CRYS ER 20 MEQ PO TBCR
20.0000 meq | EXTENDED_RELEASE_TABLET | Freq: Once | ORAL | Status: AC
  Administered 2023-07-17: 20 meq via ORAL
  Filled 2023-07-17: qty 1

## 2023-07-17 NOTE — TOC Progression Note (Signed)
 Transition of Care [facility]) - Progression Note    Patient Details  Name: Billy [name] MRN: [id number] Date of Birth: [birth year]  Transition of Care [facility]) CM/SW Contact  Nicanor Bake Phone Number: [phone] 07/17/2023, 1:32 PM  Clinical Narrative:  HF CSW spoke with patients son about SNF choices. Patients son stated that they were interested in [facility] SN[facility].   1:34 PM- Patients son called and inquired about the accepted bed offers list. CSW texted the patient the accept bed offers list. Patients son stated that he will review again and send decision. CSW explained SNF process.  TOC will continue following.      Expected Discharge Plan: IP Rehab Facility Barriers to Discharge: Continued Medical Work up  Expected Discharge Plan and Services   Discharge Planning Services: CM Consult Post Acute Care Choice: IP Rehab Living arrangements for the past 2 months: Single Family Home                                       Social Determinants of Health (SDOH) Interventions SDOH Screenings   Food Insecurity: No Food Insecurity (06/27/2023)  Housing: Low Risk  (06/27/2023)  Transportation Needs: No Transportation Needs (06/27/2023)  Utilities: Not At Risk (06/27/2023)  Depression (PHQ2-9): Low Risk  (12/01/2020)  Social Connections: Socially Isolated (06/27/2023)  Tobacco Use: Low Risk  (07/01/2023)    Readmission Risk Interventions    06/27/2023    1:52 PM 10/14/2022   11:29 AM  Readmission Risk Prevention Plan  Post Dischage Appt  Complete  Medication Screening  Complete  Transportation Screening Complete Complete  HRI or Home Care Consult Complete   Social Work Consult for Recovery Care Planning/Counseling Complete   Palliative Care Screening Not Applicable   Medication Review Oceanographer) Referral to Pharmacy
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
## 2023-07-17 NOTE — Plan of Care (Signed)
  Problem: Fluid Volume: Goal: Ability to maintain a balanced intake and output will improve Outcome: Progressing   Problem: Skin Integrity: Goal: Risk for impaired skin integrity will decrease Outcome: Progressing   Problem: Education: Goal: Knowledge of General Education information will improve Description: Including pain rating scale, medication(s)/side effects and non-pharmacologic comfort measures Outcome: Progressing   Problem: Clinical Measurements: Goal: Will remain free from infection Outcome: Progressing   Problem: Clinical Measurements: Goal: Diagnostic test results will improve Outcome: Progressing

## 2023-07-17 NOTE — Progress Notes (Signed)
   Palliative Medicine Inpatient Follow Up Note HPI: 73 y/o male with h/o CHF, CKD stage IV, Atrial Fibrillation, BPH, Anemia, carotid artery disease s/p endarterectomy and stenting, chornic rhinitis, HLD and RLS who went to his neighbors house today after having SOB for at least one week and had a cardiac arrest. The Palliative care team has been asked to support additional goals of care conversations.   Today's Discussion 07/17/2023  *Please note that this is a verbal dictation therefore any spelling or grammatical errors are due to the "Dragon Medical One" system interpretation.  Chart reviewed inclusive of vital signs, progress notes, laboratory results, and diagnostic images.   I met with Alinda Money at bedside this morning. He shares that he has some lower back pain though this is well addressed with his current pain regiment as needed. Ammaar notes that his biggest complaint is that "food passes right through me" inhibiting his ability to mobilize or do much. We discussed patients recent GIB and possible need for a colonoscopy.   Tiara shares that he is trying to eat and drink as much as possible though his loose stools do inhibit this.   Markis shares that he is not having abdominal pain, shortness of breath, or nausea.   Questions and concerns addressed/Palliative Support Provided.   Objective Assessment: Vital Signs Vitals:   07/17/23 0847 07/17/23 1034  BP: (!) 131/51 (!) 150/71  Pulse: 67 65  Resp: 19 18  Temp: 98.2 F (36.8 C) 99 F (37.2 C)  SpO2: 97% 98%    Intake/Output Summary (Last 24 hours) at 07/17/2023 1204 Last data filed at 07/17/2023 0848 Gross per 24 hour  Intake 1183.3 ml  Output 900 ml  Net 283.3 ml   Last Weight  Most recent update: 07/17/2023 12:20 AM    Weight  (P)  75 kg (165 lb 5.5 oz)            Gen:  Elderly Caucasian M in NAD HEENT: moist mucous membranes CV: Regular rate and rhythm  PULM:  On RA, breathing is even and nonlabored ABD:  soft/nontender  EXT: No edema  Neuro: Alert and oriented x3   SUMMARY OF RECOMMENDATIONS   DNAR/DNI   Living will obtained    Continue current care allowing time for outcomes  Loose stools - have alerted primary team of this   Ongoing PMT support  Billing based on MDM: Moderate  ______________________________________________________________________________________ [name] [place] Palliative Medicine Team Team Cell Phone: [phone] Please utilize secure chat with additional questions, if there is no response within 30 minutes please call the above phone number  Palliative Medicine Team providers are available by phone from 7am to 7pm daily and can be reached through the team cell phone.  Should this patient require assistance outside of these hours, please call the patient's attending physician.

## 2023-07-17 NOTE — Progress Notes (Signed)
 .rdrp          [facility]                                                                              Billy [name], is a 73 y.o. male, DOB - Jul 17, 1950, ZOX:[id number] Admit date - 06/26/2023    Outpatient Primary MD for the patient is Billy Joe, MD  LOS - 21  days  Chief Complaint  Patient presents with   Cardiac Arrest       Brief summary  Patient is a 73 y/o male with h/o CHF, CKD stage IV, Atrial Fibrillation, BPH, Anemia, carotid artery disease s/p endarterectomy and stenting, chornic rhinitis, HLD and RLS who went to his neighbors house after having SOB for at least 1 week and had a cardiac arrest. Neighbor stated CPR immediately and EMS continued . He got ROSC back after about 15 min. Then in the ED he arrested again for about 2-5 min and then achieved ROSC. Patient was admitted to the ICU.    Patient found to have cardiogenic shock and required vasopressors.  Echocardiogram with mild reduction in LV systolic function. Positive bilateral deep vein thrombosis.     03/13 direct current cardioversion for atrial fibrillation. Patient noted to have hematochezia with acute blood loss anemia.   03/15 EGD duodenitis.  03/16 IVC filter placement.  03/17 resume IV heparin for anticoagulation. 03/18 completed antibiotic therapy for pneumonia.  Patient transferred to the floor and [name] assumed care 03/20 recurrent lower gastrointestinal bleeding while on IV heparin. CT abdomen and pelvis with no active signs of bleeding. Positive bilateral pleural effusions, placed bilateral chest tubes.  03/21 no recurrent bleeding, hgb has been stable, 8,3 to 7.7, resumed heparin drip for anticoagulation.  03/22 bowel movement with dark brown red color, no low dose heparin.  03/ 23 left chest tube removed today. Positive right upper lobe infiltrate, consistent with aspiration pneumonia, started on antibiotic therapy.  03/24 continue very weak and deconditioned, but hemodynamically stable.   03/25 transfusing 1 unit PRBC today.  3/26: Overnight had hematochezia with 2 large BMs with clots.  H&H stable, heparin drip held    Assessment & Plan    Acute on chronic diastolic CHF (congestive heart failure) (HCC) -2D echo showed preserved LV systolic function with EF 55%, severe LVH, interventricular septum is flattened in systole and diastole, RV systolic function with severe reduction, RA size with mild enlargement, small pericardial effusion.   -Underwent cardiac cath on 3/19 : 3 vessel coronary artery disease with CTO RCA with L > R collaterals and moderate non obstructive coronary artery disease in the left system.  - received IV Lasix on 3/25, started on torsemide on 3/26 -Continue hydralazine.  Spironolactone, torsemide held -Creatinine now improving, will follow CHF team recommendations.  Acute on chronic cor pulmonale, pulmonary hypertension, RV failure Status post cardiac arrest, cardiogenic shock (resolved) -Continue hydralazine, torsemide held - Cardiac MRI not consistent with cardiac amyloid.   Acute respiratory failure with hypoxia due to cardiogenic pulmonary edema Bilateral pleural effusions status post bilateral chest tube placement -Pleural fluid consistent with exudate per Light's criteria, high LDH and high protein ratio. 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 Acute DVT involving the left proximal profunda vein and left proximal femoral vein. Indeterminate deep vein thrombosis involving the left popliteal vein, and left middle and distal femoral vein.  - V/Q scan 3/17 with multiple perfusion defects  -Status post IVC filter on 3/16 -Patient was on IV heparin drip, then developed hematochezia on 3/26 overnight and 3/27, heparin drip placed on hold.   GI consulted.   - retrial of low-dose IV heparin again per cardiology on 3/28 -Per patient and nurse tech, patient had a BM this morning, did not notice any blood.  Follow CBC     Gastrointestinal hemorrhage with melena, acute blood loss anemia Iron deficiency anemia -Patient has been seen by GI this admission, EGD (Dr Marca Ancona) on 3/15 showed erosive gastropathy with no bleeding and no stigmata of recent bleeding. Bleeding friable duodenal mucosa, treated with hemostatic spray.  CTA was negative for active bleeding. -Iron profile showed FE 29, TIBC 234, percent saturation ratio 12, ferritin 39, will benefit from IV iron -GI following, plan for colonoscopy if continues to bleed  CKD stage 3b, GFR 30-44 ml/min (HCC) -Baseline creatinine 2-2.2 -Creatinine improving, worsened likely due to diuresis, creatinine plateaued at 2.4 on 3/28   Aspiration pneumonia of right upper lobe (HCC) New right  upper lobe infiltrate, consistent with aspiration pneumonia.  Trach culture with rare gram positive cocci in pairs and clusters, no staph aureus or pseudomonas. -Completed IV Zosyn on 3/29     PAF (paroxysmal atrial fibrillation) (HCC) -Sp direct current cardioversion, patient in and out atrial fibrillation.  -Continue amiodarone.  Heparin was resumed on 3/28 -H&H stable, cardiology following   Hypertension Continue hydralazine   Hyperlipidemia Continue statin therapy.      Hypoalbuminemia, severe protein calorie malnutrition, severe deconditioning -Albumin level 1.8 on 3/22 Nutrition Problem: Inadequate oral intake Etiology: acute illness Signs/Symptoms: meal completion < 25% Interventions: Ensure Enlive (each supplement provides 350kcal and 20 grams of protein), Magic cup, MVI, Liberalize Diet, Snacks  Estimated body mass index is 21.81 kg/m (pended) as calculated from the following:   Height as of this encounter: 6\' 1"  (1.854 m).   Weight as of this encounter: (P) 75 kg.  Code  Status: Full code DVT Prophylaxis:  Place TED hose Start: 07/11/23 0919 SCDs Start: 06/26/23 2124   Level of Care: Level of care: Progressive Family Communication: Updated patient Disposition Plan:      Remains inpatient appropriate: Will need SNF.  Palliative medicine following for goals of care, overall poor prognosis, advanced age, multiple comorbidities and recurrent GI bleed with anticoagulation.   Procedures:  2D echo Chest tubes  Consultants:     Antimicrobials:   Anti-infectives (From admission, onward)    Start     Dose/Rate Route Frequency Ordered Stop   07/11/23 1045  linezolid (ZYVOX) tablet 600 mg  Status:  Discontinued        600 mg Oral Every 12 hours 07/11/23 0955 07/11/23 1457   07/09/23 1400  piperacillin-tazobactam (ZOSYN) IVPB 3.375 g        3.375 g 12.5 mL/hr over 240 Minutes Intravenous Every 8 hours 07/09/23 1019 07/16/23 0941   07/09/23 1045  linezolid (ZYVOX) IVPB 600 mg  Status:  Discontinued        600 mg 300 mL/hr over 60 Minutes Intravenous Every  12 hours 07/09/23 0946 07/11/23 0955   06/29/23 1000  cefTRIAXone (ROCEPHIN) 2 g in sodium chloride 0.9 % 100 mL IVPB        2 g 200 mL/hr over 30 Minutes Intravenous Every 24 hours 06/28/23 1015 07/01/23 1008   06/28/23 0915  Ampicillin-Sulbactam (UNASYN) 3 g in sodium chloride 0.9 % 100 mL IVPB  Status:  Discontinued        3 g 200 mL/hr over 30 Minutes Intravenous Every 12 hours 06/28/23 0822 06/28/23 1015   06/27/23 0915  cefTRIAXone (ROCEPHIN) 2 g in sodium chloride 0.9 % 100 mL IVPB  Status:  Discontinued        2 g 200 mL/hr over 30 Minutes Intravenous Every 24 hours 06/27/23 0828 06/28/23 0822   06/27/23 0915  doxycycline (VIBRAMYCIN) 100 mg in sodium chloride 0.9 % 250 mL IVPB        100 mg 125 mL/hr over 120 Minutes Intravenous Every 12 hours 06/27/23 0828 07/01/23 2310          Medications  alteplase  2 mg Intracatheter Once   amiodarone  200 mg Oral BID   atorvastatin  80 mg Oral  Daily   Chlorhexidine Gluconate Cloth  6 each Topical Daily   ezetimibe  10 mg Oral Daily   feeding supplement  237 mL Oral TID WC & HS   [name] butt cream   Topical BID   hydrALAZINE  50 mg Oral Q8H   lidocaine  1 patch Transdermal Q24H   multivitamin with minerals  1 tablet Oral Daily   pantoprazole  40 mg Oral BID   sertraline  50 mg Oral QHS   sodium chloride flush  10-40 mL Intracatheter Q12H   sodium chloride flush  3 mL Intravenous Q12H      Subjective:   Billy [name] was seen and examined today.  No acute complaints, BM this morning, no obvious hematochezia.  No chest pain, shortness of breath, fever chills nausea vomiting. + Generalized debility  Objective:   Vitals:   07/17/23 0440 07/17/23 0629 07/17/23 0743 07/17/23 0847  BP: (!) 158/64 (!) 157/60 (!) 145/51 (!) 131/51  Pulse: 66  62 67  Resp: 18  17 19   Temp: 99 F (37.2 C)  99 F (37.2 C) 98.2 F (36.8 C)  TempSrc: Oral  Oral Oral  SpO2: 98%  96% 97%  Weight:      Height:        Intake/Output Summary (Last 24 hours) at 07/17/2023 0945 Last data filed at 07/17/2023 0848 Gross per 24 hour  Intake 1183.3 ml  Output 900 ml  Net 283.3 ml     Wt Readings from Last 3 Encounters:  07/17/23 (P) 75 kg  03/27/23 81.2 kg  03/14/23 86 kg    Physical Exam General: Alert and oriented x 3, NAD Cardiovascular: S1 S2 clear, RRR.  Respiratory: CTAB, no wheezing Gastrointestinal: Soft, nontender, nondistended, NBS Ext: no pedal edema bilaterally Neuro: no new deficits Psych: Normal affect    Data Reviewed:  I have personally reviewed following labs    CBC Lab Results  Component Value Date   WBC 6.8 07/16/2023   RBC 3.29 (L) 07/16/2023   HGB 8.1 (L) 07/16/2023   HCT 27.2 (L) 07/16/2023   MCV 82.7 07/16/2023   MCH 24.6 (L) 07/16/2023   PLT 289 07/16/2023   MCHC 29.8 (L) 07/16/2023   RDW 19.7 (H) 07/16/2023   LYMPHSABS 4.1 (H) 06/26/2023   MONOABS 0.7 06/26/2023  EOSABS 0.4 06/26/2023   BASOSABS  0.1 06/26/2023     Last metabolic panel Lab Results  Component Value Date   NA 138 07/17/2023   K 4.0 07/17/2023   CL 107 07/17/2023   CO2 25 07/17/2023   BUN 23 07/17/2023   CREATININE 1.85 (H) 07/17/2023   GLUCOSE 86 07/17/2023   GFRNONAA 39 (L) 07/17/2023   GFRAA 29 (L) 08/31/2019   CALCIUM 8.1 (L) 07/17/2023   PHOS 3.1 07/01/2023   PROT 5.0 (L) 07/08/2023   ALBUMIN 1.8 (L) 07/08/2023   LABGLOB 2.4 06/27/2023   AGRATIO 1.8 12/03/2015   BILITOT 0.5 07/08/2023   ALKPHOS 97 07/08/2023   AST 23 07/08/2023   ALT 58 (H) 07/08/2023   ANIONGAP 6 07/17/2023    CBG (last 3)  No results for input(s): "GLUCAP" in the last 72 hours.     Coagulation Profile: No results for input(s): "INR", "PROTIME" in the last 168 hours.   Radiology Studies: I have personally reviewed the imaging studies  No results found.      Thad Ranger M.D. [facility] 07/17/2023, 9:45 AM  Available via Epic secure chat 7am-7pm After 7 pm, please refer to night coverage provider listed on amion.

## 2023-07-17 NOTE — Plan of Care (Signed)

## 2023-07-17 NOTE — Progress Notes (Signed)
 Subjective: No bleeding since yesterday.  Objective: Vital signs in last 24 hours: Temp:  [98 F (36.7 C)-99 F (37.2 C)] 99 F (37.2 C) (03/31 1034) Pulse Rate:  [62-68] 65 (03/31 1034) Resp:  [17-19] 18 (03/31 1034) BP: (131-158)/(51-71) 150/71 (03/31 1034) SpO2:  [95 %-98 %] 98 % (03/31 1034) Weight:  [75 kg] (P) 75 kg (03/31 0017) Weight change:  Last BM Date : 07/17/23  PE: GEN:  Alert, deconditioned-appearing, short-of-breath at rest ABD:  Soft, non-tender  Lab Results: CBC    Component Value Date/Time   WBC 8.4 07/17/2023 1102   RBC 3.65 (L) 07/17/2023 1102   HGB 9.1 (L) 07/17/2023 1102   HCT 30.5 (L) 07/17/2023 1102   PLT 307 07/17/2023 1102   MCV 83.6 07/17/2023 1102   MCH 24.9 (L) 07/17/2023 1102   MCHC 29.8 (L) 07/17/2023 1102   RDW 20.0 (H) 07/17/2023 1102   LYMPHSABS 4.1 (H) 06/26/2023 2039   MONOABS 0.7 06/26/2023 2039   EOSABS 0.4 06/26/2023 2039   BASOSABS 0.1 06/26/2023 2039  CMP     Component Value Date/Time   NA 138 07/17/2023 0441   NA 139 03/27/2023 0948   K 4.0 07/17/2023 0441   CL 107 07/17/2023 0441   CO2 25 07/17/2023 0441   GLUCOSE 86 07/17/2023 0441   BUN 23 07/17/2023 0441   BUN 46 (H) 03/27/2023 0948   CREATININE 1.85 (H) 07/17/2023 0441   CALCIUM 8.1 (L) 07/17/2023 0441   PROT 5.0 (L) 07/08/2023 0435   PROT 5.9 (L) 11/21/2017 0958   ALBUMIN 1.8 (L) 07/08/2023 0435   ALBUMIN 4.2 12/03/2015 0918   AST 23 07/08/2023 0435   ALT 58 (H) 07/08/2023 0435   ALKPHOS 97 07/08/2023 0435   BILITOT 0.5 07/08/2023 0435   BILITOT <0.2 12/03/2015 0918   EGFR 25 (L) 03/27/2023 0948   GFRNONAA 39 (L) 07/17/2023 0441    Assessment:   Hematochezia, quiescent at present. Recent cardiac arrest, blood clots, need long-term anticoagulation.  Plan:   I suspect source of bleeding is either diffuse GI tract oozing (in which case colonoscopy will add little at this time) or from the localized duodenal oozing seen on prior recent endoscopy (which  wasn't amenable to endoscopic treatment, and for which he is already on PPI). I feel risk of colonoscopy would impart significant risk (at least 30% risk of serious complication such as recurrent cardiac arrest, death) and little benefit.   We are not opposed to doing colonoscopy at some point, but I wouldn't consider it any time in the next few weeks. [facility] will follow at a distance.   Freddy Jaksch 07/17/2023, 1:27 PM   Cell [phone] If no answer or after 5 PM call [phone]
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
## 2023-07-18 ENCOUNTER — Other Ambulatory Visit (HOSPITAL_COMMUNITY): Payer: Self-pay

## 2023-07-18 DIAGNOSIS — I5033 Acute on chronic diastolic (congestive) heart failure: Secondary | ICD-10-CM | POA: Diagnosis not present

## 2023-07-18 DIAGNOSIS — T68XXXA Hypothermia, initial encounter: Secondary | ICD-10-CM | POA: Diagnosis not present

## 2023-07-18 DIAGNOSIS — K921 Melena: Secondary | ICD-10-CM | POA: Diagnosis not present

## 2023-07-18 DIAGNOSIS — I469 Cardiac arrest, cause unspecified: Secondary | ICD-10-CM | POA: Diagnosis not present

## 2023-07-18 DIAGNOSIS — R9431 Abnormal electrocardiogram [ECG] [EKG]: Secondary | ICD-10-CM | POA: Diagnosis not present

## 2023-07-18 DIAGNOSIS — I82409 Acute embolism and thrombosis of unspecified deep veins of unspecified lower extremity: Secondary | ICD-10-CM

## 2023-07-18 LAB — RENAL FUNCTION PANEL
Albumin: 1.6 g/dL — ABNORMAL LOW (ref 3.5–5.0)
Anion gap: 6 (ref 5–15)
BUN: 25 mg/dL — ABNORMAL HIGH (ref 8–23)
CO2: 24 mmol/L (ref 22–32)
Calcium: 8 mg/dL — ABNORMAL LOW (ref 8.9–10.3)
Chloride: 107 mmol/L (ref 98–111)
Creatinine, Ser: 2.11 mg/dL — ABNORMAL HIGH (ref 0.61–1.24)
GFR, Estimated: 33 mL/min — ABNORMAL LOW (ref >60)
Glucose, Bld: 95 mg/dL (ref 70–99)
Phosphorus: 3.2 mg/dL (ref 2.5–4.6)
Potassium: 4.1 mmol/L (ref 3.5–5.1)
Sodium: 137 mmol/L (ref 135–145)

## 2023-07-18 LAB — COOXEMETRY PANEL
Carboxyhemoglobin: 3.6 % — ABNORMAL HIGH (ref 0.5–1.5)
Methemoglobin: <0.7 % (ref 0.0–1.5)
O2 Saturation: 80.9 %
Total hemoglobin: <6.9 g/dL — CL (ref 12.0–16.0)

## 2023-07-18 LAB — CBC
HCT: 27.9 % — ABNORMAL LOW (ref 39.0–52.0)
Hemoglobin: 8.3 g/dL — ABNORMAL LOW (ref 13.0–17.0)
MCH: 24.7 pg — ABNORMAL LOW (ref 26.0–34.0)
MCHC: 29.7 g/dL — ABNORMAL LOW (ref 30.0–36.0)
MCV: 83 fL (ref 80.0–100.0)
Platelets: 282 10*3/uL (ref 150–400)
RBC: 3.36 MIL/uL — ABNORMAL LOW (ref 4.22–5.81)
RDW: 19.9 % — ABNORMAL HIGH (ref 11.5–15.5)
WBC: 7.1 10*3/uL (ref 4.0–10.5)
nRBC: 0 % (ref 0.0–0.2)

## 2023-07-18 MED ORDER — SODIUM CHLORIDE 0.9 % IV SOLN
500.0000 mg | Freq: Once | INTRAVENOUS | Status: AC
  Administered 2023-07-18: 500 mg via INTRAVENOUS
  Filled 2023-07-18: qty 25

## 2023-07-18 MED ORDER — HYDRALAZINE HCL 50 MG PO TABS: 100.0000 mg | ORAL_TABLET | Freq: Three times a day (TID) | ORAL | Status: DC

## 2023-07-18 MED ORDER — BOOST PLUS PO LIQD
237.0000 mL | Freq: Two times a day (BID) | ORAL | Status: DC
  Administered 2023-07-18 – 2023-07-19 (×2): 237 mL via ORAL
  Filled 2023-07-18 (×5): qty 237

## 2023-07-18 MED ORDER — SACUBITRIL-VALSARTAN 24-26 MG PO TABS
1.0000 | ORAL_TABLET | Freq: Two times a day (BID) | ORAL | Status: DC
  Administered 2023-07-18 (×2): 1 via ORAL
  Filled 2023-07-18 (×2): qty 1

## 2023-07-18 MED ORDER — IRON SUCROSE 500 MG IVPB - SIMPLE MED
500.0000 mg | Freq: Once | INTRAVENOUS | Status: DC
  Filled 2023-07-18: qty 275

## 2023-07-18 NOTE — TOC Benefit Eligibility Note (Signed)
 Pharmacy Patient Advocate Encounter  Insurance verification completed.    The patient is insured through [employer]/ [employer]. Patient has Medicare and is not eligible for a copay card, but may be able to apply for patient assistance or Medicare RX Payment Plan (Patient Must reach out to their plan, if eligible for payment plan), if available.    Ran test claim for Entresto and the current 30 day co-pay is $47.00.   This test claim was processed through [facility]- copay amounts may vary at other pharmacies due to pharmacy/plan contracts, or as the patient moves through the different stages of their insurance plan.

## 2023-07-18 NOTE — Plan of Care (Signed)
   Problem: Education: Goal: Ability to describe self-care measures that may prevent or decrease complications (Diabetes Survival Skills Education) will improve Outcome: Progressing Goal: Individualized Educational Video(s) Outcome: Progressing

## 2023-07-18 NOTE — Progress Notes (Signed)
   Palliative Medicine Inpatient Follow Up Note HPI: 73 y/o male with h/o CHF, CKD stage IV, Atrial Fibrillation, BPH, Anemia, carotid artery disease s/p endarterectomy and stenting, chornic rhinitis, HLD and RLS who went to his neighbors house today after having SOB for at least one week and had a cardiac arrest. The Palliative care team has been asked to support additional goals of care conversations.   Today's Discussion 07/18/2023  *Please note that this is a verbal dictation therefore any spelling or grammatical errors are due to the "Dragon Medical One" system interpretation.  Chart reviewed inclusive of vital signs, progress notes, laboratory results, and diagnostic images. No additional GIB.   Per patients RN, Alvira Philips there are no significant concerns for [place] today.   I met with Alinda Money at bedside this afternoon. He is in NAD.   Reviewed the plan for discharge once a skilled nursing facility is identified.  Questions and concerns addressed/Palliative Support Provided.   Objective Assessment: Vital Signs Vitals:   07/18/23 0735 07/18/23 1150  BP: (!) 171/61 (!) 148/65  Pulse: 73 63  Resp: 17 18  Temp: 97.8 F (36.6 C) 97.7 F (36.5 C)  SpO2: 95% 98%    Intake/Output Summary (Last 24 hours) at 07/18/2023 1445 Last data filed at 07/18/2023 1300 Gross per 24 hour  Intake 240 ml  Output 1300 ml  Net -1060 ml   Last Weight  Most recent update: 07/18/2023  4:59 AM    Weight  75.3 kg (166 lb 0.1 oz)            Gen:  Elderly Caucasian M in NAD HEENT: moist mucous membranes CV: Regular rate and rhythm  PULM:  On RA, breathing is even and nonlabored ABD: soft/nontender  EXT: No edema  Neuro: Alert and oriented x3   SUMMARY OF RECOMMENDATIONS   DNAR/DNI   Living will obtained    Continue current care allowing time for outcomes   The PMT will continue to follow along peripherally  Billing based on MDM:  Low ______________________________________________________________________________________ [name] [place] Palliative Medicine Team Team Cell Phone: [phone] Please utilize secure chat with additional questions, if there is no response within 30 minutes please call the above phone number  Palliative Medicine Team providers are available by phone from 7am to 7pm daily and can be reached through the team cell phone.  Should this patient require assistance outside of these hours, please call the patient's attending physician.
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
## 2023-07-18 NOTE — TOC Progression Note (Addendum)
 Transition of Care [facility]) - Progression Note    Patient Details  Name: Billy [name] MRN: [id number] Date of Birth: [birth year]  Transition of [facility] CM/SW Contact  Nicanor Bake Phone Number: [phone] 07/18/2023, 10:26 AM  Clinical Narrative:   9:40 AM- HF CSW received a phone call from patients son Billy [name] stating that his father was being dc today. CSW stated that she will follow up with the medical team and update. CSW sent a message via secure chat that confirmed that patient is ready.   10:09 AM- HF CSW called Billy [name] back to share updates. Billy [name] stated that the family is interested in [name]. CSW contacted [name] who stated that she does not have a bed available. [name] stated they have a bed available. [name] will come see him today and update.   11:34 AM: Patients sister Billy [name] is not satisfied with accepted bed offer list. CSW explained the process. Patients sister inquired about [name] and [name]. [name] stated that the family cannot provide care for patient once he dc from [name]. CSW encouraged the patients son Billy [name] and sister  12:22 PM- Patients sister Billy [name] expressed that she is not happy with accepted bed list and wanted him to be faxed out to additional facilities. CSW faxed him out to [name]. CSW called and no one answered. Billy [name] at [name] stated that she will follow up soon.   12:56 PM- HF CSW received a phone call form [name] again and she said that she is going to call [name] and call CSW back.   1:23 PM- HF CSW received a phone call from patients sister stating the she received a call from Billy [name] at [place] [name]. Billy [name] stated that she scheduled a tour with [name] for 4/2 and will make a SNF decision after. CSW reiterated the SNF process and that bed availability may be subject to change. [name] stated that she understands.   HF CSW called and spoke with Billy [name] at [name] to start insurance auth for [name] SN[facility].   Insurance auth pending.    TOC will continue following.     Expected Discharge Plan: IP Rehab Facility Barriers to Discharge: Continued Medical Work up  Expected Discharge Plan and Services   Discharge Planning Services: CM Consult Post Acute Care Choice: IP Rehab Living arrangements for the past 2 months: Single Family Home                                       Social Determinants of Health (SDOH) Interventions SDOH Screenings   Food Insecurity: No Food Insecurity (06/27/2023)  Housing: Low Risk  (06/27/2023)  Transportation Needs: No Transportation Needs (06/27/2023)  Utilities: Not At Risk (06/27/2023)  Depression (PHQ2-9): Low Risk  (12/01/2020)  Social Connections: Socially Isolated (06/27/2023)  Tobacco Use: Low Risk  (07/01/2023)    Readmission Risk Interventions    06/27/2023    1:52 PM 10/14/2022   11:29 AM  Readmission Risk Prevention Plan  Post Dischage Appt  Complete  Medication Screening  Complete  Transportation Screening Complete Complete  HRI or Home Care Consult Complete   Social Work Consult for Recovery Care Planning/Counseling Complete   Palliative Care Screening Not Applicable   Medication Review Oceanographer) Referral to Pharmacy

## 2023-07-18 NOTE — Progress Notes (Addendum)
 Patient ID: Billy [name], male   DOB: 08/05/1950, 73 y.o.   MRN: [id number]     Advanced Heart Failure Rounding Note  Cardiologist: Nanetta Batty, MD   Chief Complaint: RV failure  Subjective:    Denies SOB.   Events  -  3/10 admitted with OOH cardiac arrest. 20 min CPR. Shocked for VF - In/out AF -> IV amio - Echo EF 45% with RV dysfunction -> suspect PE. No CT with AKI - U/s upper and lower extremities with DVTs - V/Q 3/17 with multiple perfusion defects consistent with CXR findings. ->  IVC filter placed due to GIB - EGD (3/15) showed bleeding friable duodenal mucosa treated with hemostatic spray. No overt GI bleeding today, hgb 8.9.  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 Mild PAH with severe RV failure though cardiac output is preserved. Hep gtt restarted post-cath - Recurrent GIB 3/20. CT angio of abdomen showed no active bleeding. + large bilateral effusion - Bilateral CTs placed 3/20 (transudative)  - L chest tube removed 3/23, R chest tube removed 03/27 - 1 u RBCs 03/25 for bleeding. Heparin restarted 03/28  Objective:   Weight Range: 75.3 kg Body mass index is 21.9 kg/m.   Vital Signs: Temp:  [97.8 F (36.6 C)-99 F (37.2 C)] 97.8 F (36.6 C) (04/01 0735) Pulse Rate:  [64-74] 73 (04/01 0735) Resp:  [17-19] 17 (04/01 0735) BP: (131-171)/(51-72) 171/61 (04/01 0735) SpO2:  [95 %-99 %] 95 % (04/01 0735) Weight:  [75.3 kg] 75.3 kg (04/01 0459) Last BM Date : 07/17/23  Weight change: Filed Weights   07/16/23 0438 07/17/23 0017 07/18/23 0459  Weight: 75.6 kg (P) 75 kg 75.3 kg   Intake/Output:  Intake/Output Summary (Last 24 hours) at 07/18/2023 0800 Last data filed at 07/18/2023 0459 Gross per 24 hour  Intake 188.7 ml  Output 1300 ml  Net -1111.3 ml  CVP 4  Physical Exam  General:   No resp difficulty. Appear weak.  Neck: supple. no JVD.  Cor: PMI nondisplaced. Regular rate & rhythm. No rubs,  gallops or murmurs. Lungs: clear Abdomen: soft, nontender, nondistended.  Extremities: no cyanosis, clubbing, rash, edema Neuro: alert & oriented x3   Telemetry   SR 60-70s    Labs    CBC Recent Labs    07/17/23 1102 07/18/23 0420  WBC 8.4 7.1  HGB 9.1* 8.3*  HCT 30.5* 27.9*  MCV 83.6 83.0  PLT 307 282   Basic Metabolic Panel Recent Labs    70/35/00 0441 07/18/23 0420  NA 138 137  K 4.0 4.1  CL 107 107  CO2 25 24  GLUCOSE 86 95  BUN 23 25*  CREATININE 1.85* 2.11*  CALCIUM 8.1* 8.0*  PHOS  --  3.2   Liver Function Tests Recent Labs    07/18/23 0420  ALBUMIN 1.6*     No results for input(s): "LIPASE", "AMYLASE" in the last 72 hours. Cardiac Enzymes No results for input(s): "CKTOTAL", "CKMB", "CKMBINDEX", "TROPONINI" in the last 72 hours.  BNP: BNP (last 3 results) Recent Labs    03/09/23 1544 03/11/23 0218 06/27/23 1116  BNP 660.9* 491.3* 646.4*   Imaging   No results found.   Medications:   Scheduled Medications:  amiodarone  200 mg Oral BID   apixaban  5 mg Oral BID   atorvastatin  80 mg Oral Daily   Chlorhexidine Gluconate Cloth  6 each Topical Daily   ezetimibe  10 mg Oral Daily   feeding supplement  237 mL Oral TID WC & HS   [name] butt cream   Topical BID   hydrALAZINE  75 mg Oral Q8H   lidocaine  1 patch Transdermal Q24H   multivitamin with minerals  1 tablet Oral Daily   pantoprazole  40 mg Oral BID   sertraline  50 mg Oral QHS   sodium chloride flush  10-40 mL Intracatheter Q12H   sodium chloride flush  3 mL Intravenous Q12H   torsemide  10 mg Oral Daily   Infusions:    PRN Medications: acetaminophen, barrier cream, docusate sodium, feeding supplement, hydrocortisone cream, HYDROmorphone (DILAUDID) injection, influenza vaccine adjuvanted, ondansetron (ZOFRAN) IV, mouth rinse, oxyCODONE, pneumococcal 20-valent conjugate vaccine, polyethylene glycol, sodium chloride flush, sodium chloride flush  Patient Profile    Billy [name] is a 73 yo male with PMH of HTN, HLD, prior DVT, CVA, paroxysmal atrial fibrillation, CKD stage IIIb, carotid artery disease s/p endarterectomy, and chronic diastolic CHF. Admitted following out of hospital witnessed cardiac arrest.   Assessment/Plan   1. Cardiac arrest:  - Witnessed arrest with bystander then EMS CPR.  >20 minutes CPR.  He was shocked for VF.  Had additional 4-5 minutes PEA arrest in ER. ?PE with aspiration given bilateral DVTs.  - suspect due to PE. Cath stable CAD - VQ and LE/UE u/s + DVT  - No chest CT with renal failure - No change  2. Shock, cardiogenic +/- septic component: Initial echo: EF 45%, diffuse HK, severe LVH, normal RV size with mod decreased systolic function, small pericardial effusion, dilated IVC.  Suspicion for PE as above.  PNA on CXR, initial hypotension thought to be due to septic shock.   Resolved. - Completed antibiotics.   3. Acute HF with mid range EF: With prominent RV failure. Initial echo: EF 45%, diffuse HK, severe LVH, normal RV size with mod decreased systolic function, small pericardial effusion, dilated IVC. As above, worry for PE with RV failure. Baseline cardiomyopathy with severe LVH raises concern for cardiac amyloidosis.  TEE 03/13: EF 55%, severe LVH with mild RV dysfunction, small IVC.  - L/RHC 07/05/23: 3v CAD with CTO RCA with L->R collaterals and mod non-obstructive CAD in L system. Mild PAH with severe RV failure though CO is preserved.   - cMRI completed 3/21  LVEF 52% RVEF 53%, not consistent with cardiac amyloid.  GDMT Previously limited by CKD.  Add ARNi today.   - Hold off on bb.  - Appears euvolemic. Continue torsemide 10 mg daily.  - Stop  hydralazine+ imdur - Start entresto 24-26 mg twice a day and can titrate up tomorrow based on labs tomorrow.  -Iron sats low. Give IV iron today.   4. Venous thromboembolism: Bilateral upper and lower DVTs, mixed acute and chronic.  He is not anticoagulated at home.  With  RV failure on echo, strong suspicion for PE.  - s/p infrarenal IVC placement 3/16.   - V/Q 3/17 with multiple perfusion defects consistent with CXR findings. - No CTA with AKI.  - BUE [place] with SVTs and DVT.  - Need for lifelong AC in setting of multiple DVT/Pes. GI following, plan for colonoscopy if recurrent bleeding. -Back on eliquis 5 mg twice aday.   5. CAD: NSTEMI with HS-TnI 3045.  ?Demand ischemia from arrest vs ACS.  He has history of carotid disease. - L/RHC 07/05/23: 3v CAD with CTO RCA with L->R collaterals and moderate non-obstructive CAD in L system.  Mild PAH with severe RV failure though CO is preserved.   - Plavix stopped with GI bleeding.  - Atorvastatin 80 daily. On eliquis 5 mg twice a day.  - No chest pain.   6. Carotid stenosis: h/o CVA.  TCAR in 6/24.  Okay to remain off plavix at this point    7. Persistent atrial fibrillation: He was not on AC at home though has history of prior CVA.  AC stopped due to prior GI bleeds. He went into AF/RVR here, tolerated poorly.  TEE/DCCV on 03/13.   - Maintaining SR. Continue po amiodarone. - Continue eliquis 5 mg twice a day.   8. AKI on CKD 3b: Suspect ATN in setting of hypotension/arrest.  Baseline Scr seems to be around 2.1. - Scr 2.4>>>trending down after holding diuretic 03/28.  - DIuretics restarted 3/31, small rise in creatinine.   9. ID: PCT 8.37 on 03/11, suspect PNA.   - He has completed course of ceftriaxone/doxycycline.  - Abx restarted 03/23 as above. Concern for aspiration PNA. Completed.  10. Acute GI bleeding: Hematochezia with AC.  Had this in the past with [facility] as well.  GI following.  Has had transfusion here. EGD showed bleeding friable duodenal mucosa treated with hemostatic spray.  Had recurrent bleeding 3/20. Heparin held. CT angio of abdomen showed no active bleeding. -Heparin held again on 03/26 for bleeding but now back on.  Hgb stable. As above giving IV Iron.    12. Pleural Effusions - s/p bilatCT  placement per CCM 3/20  - transudative - Left chest tube removed 3/23 - Right CT out 3/27  13. Severe deconditioning - PT/OT - Not a candidate for CIR as family unable to help once he's home. Plan for SNF at discharge.   14. Severe protein-calorie malnutrition - Dietician consulted  15. GOC - multiple markers of poor prognosis - Palliative care consulted. He is a DNR  - plan for SNF at discharge     Tonye Becket, NP  8:00 AM

## 2023-07-19 ENCOUNTER — Other Ambulatory Visit (HOSPITAL_COMMUNITY): Payer: Self-pay

## 2023-07-19 ENCOUNTER — Telehealth (HOSPITAL_COMMUNITY): Payer: Self-pay | Admitting: Pharmacy Technician

## 2023-07-19 DIAGNOSIS — I2609 Other pulmonary embolism with acute cor pulmonale: Secondary | ICD-10-CM | POA: Diagnosis not present

## 2023-07-19 LAB — CBC
HCT: 31.3 % — ABNORMAL LOW (ref 39.0–52.0)
Hemoglobin: 9.3 g/dL — ABNORMAL LOW (ref 13.0–17.0)
MCH: 24.8 pg — ABNORMAL LOW (ref 26.0–34.0)
MCHC: 29.7 g/dL — ABNORMAL LOW (ref 30.0–36.0)
MCV: 83.5 fL (ref 80.0–100.0)
Platelets: 293 10*3/uL (ref 150–400)
RBC: 3.75 MIL/uL — ABNORMAL LOW (ref 4.22–5.81)
RDW: 20 % — ABNORMAL HIGH (ref 11.5–15.5)
WBC: 8.2 10*3/uL (ref 4.0–10.5)
nRBC: 0 % (ref 0.0–0.2)

## 2023-07-19 LAB — BASIC METABOLIC PANEL WITH GFR
Anion gap: 8 (ref 5–15)
BUN: 22 mg/dL (ref 8–23)
CO2: 23 mmol/L (ref 22–32)
Calcium: 8.2 mg/dL — ABNORMAL LOW (ref 8.9–10.3)
Chloride: 105 mmol/L (ref 98–111)
Creatinine, Ser: 1.94 mg/dL — ABNORMAL HIGH (ref 0.61–1.24)
GFR, Estimated: 37 mL/min — ABNORMAL LOW (ref >60)
Glucose, Bld: 96 mg/dL (ref 70–99)
Potassium: 4.2 mmol/L (ref 3.5–5.1)
Sodium: 136 mmol/L (ref 135–145)

## 2023-07-19 LAB — RENAL FUNCTION PANEL
Albumin: 1.6 g/dL — ABNORMAL LOW (ref 3.5–5.0)
Anion gap: 5 (ref 5–15)
BUN: 23 mg/dL (ref 8–23)
CO2: 23 mmol/L (ref 22–32)
Calcium: 8.1 mg/dL — ABNORMAL LOW (ref 8.9–10.3)
Chloride: 110 mmol/L (ref 98–111)
Creatinine, Ser: 1.92 mg/dL — ABNORMAL HIGH (ref 0.61–1.24)
GFR, Estimated: 37 mL/min — ABNORMAL LOW (ref >60)
Glucose, Bld: 92 mg/dL (ref 70–99)
Phosphorus: 3.2 mg/dL (ref 2.5–4.6)
Potassium: 4.5 mmol/L (ref 3.5–5.1)
Sodium: 138 mmol/L (ref 135–145)

## 2023-07-19 MED ORDER — TORSEMIDE 20 MG PO TABS
40.0000 mg | ORAL_TABLET | Freq: Every day | ORAL | Status: DC
  Administered 2023-07-19 – 2023-07-20 (×2): 40 mg via ORAL
  Filled 2023-07-19 (×2): qty 2

## 2023-07-19 MED ORDER — SACUBITRIL-VALSARTAN 97-103 MG PO TABS: 1.0000 | ORAL_TABLET | Freq: Two times a day (BID) | ORAL | Status: DC

## 2023-07-19 MED ORDER — EMPAGLIFLOZIN 10 MG PO TABS
10.0000 mg | ORAL_TABLET | Freq: Every day | ORAL | Status: DC
  Administered 2023-07-19 – 2023-07-20 (×2): 10 mg via ORAL
  Filled 2023-07-19 (×2): qty 1

## 2023-07-19 MED ORDER — SACUBITRIL-VALSARTAN 49-51 MG PO TABS
1.0000 | ORAL_TABLET | Freq: Two times a day (BID) | ORAL | Status: DC
  Administered 2023-07-19 (×2): 1 via ORAL
  Filled 2023-07-19 (×2): qty 1

## 2023-07-19 MED ORDER — SACUBITRIL-VALSARTAN 49-51 MG PO TABS: 1.0000 | ORAL_TABLET | Freq: Two times a day (BID) | ORAL | Status: DC

## 2023-07-19 NOTE — Care Management Important Message (Signed)
 Important Message  Patient Details  Name: Billy [name] MRN: [id number] Date of Birth: [birth year]   Important Message Given:  Yes - Medicare IM     Renie Ora 07/19/2023, 10:02 AM

## 2023-07-19 NOTE — TOC Progression Note (Signed)
 Transition of Care [facility]) - Progression Note    Patient Details  Name: Billy [name] MRN: [id number] Date of Birth: 1950/05/03  Transition of [facility]) CM/SW Contact  Nicanor Bake Phone Number: [phone] 07/19/2023, 4:08 PM  Clinical Narrative:   HF CSW spoke with patients son, [name] who confirmed that they toured [name] today and would like to move forward.   Insurance auth pending.   TOC will continue following.     Expected Discharge Plan: IP Rehab Facility Barriers to Discharge: Continued Medical Work up  Expected Discharge Plan and Services   Discharge Planning Services: CM Consult Post Acute Care Choice: IP Rehab Living arrangements for the past 2 months: Single Family Home                                       Social Determinants of Health (SDOH) Interventions SDOH Screenings   Food Insecurity: No Food Insecurity (06/27/2023)  Housing: Low Risk  (06/27/2023)  Transportation Needs: No Transportation Needs (06/27/2023)  Utilities: Not At Risk (06/27/2023)  Depression (PHQ2-9): Low Risk  (12/01/2020)  Social Connections: Socially Isolated (06/27/2023)  Tobacco Use: Low Risk  (07/01/2023)    Readmission Risk Interventions    06/27/2023    1:52 PM 10/14/2022   11:29 AM  Readmission Risk Prevention Plan  Post Dischage Appt  Complete  Medication Screening  Complete  Transportation Screening Complete Complete  HRI or Home Care Consult Complete   Social Work Consult for Recovery Care Planning/Counseling Complete   Palliative Care Screening Not Applicable   Medication Review Oceanographer) Referral to Pharmacy

## 2023-07-19 NOTE — Progress Notes (Signed)
   Palliative Medicine Inpatient Follow Up Note HPI: 73 y/o male with h/o CHF, CKD stage IV, Atrial Fibrillation, BPH, Anemia, carotid artery disease s/p endarterectomy and stenting, chornic rhinitis, HLD and RLS who went to his neighbors house today after having SOB for at least one week and had a cardiac arrest. The Palliative care team has been asked to support additional goals of care conversations.   Today's Discussion 07/19/2023  *Please note that this is a verbal dictation therefore any spelling or grammatical errors are due to the "[employer]agon [employer] One" system interpretation.  Chart reviewed inclusive of vital signs, progress notes, laboratory results, and diagnostic images.   I spoke with Okechukwu's nurse who shares no concerns regarding his overall care today.  I met with Alinda Money at bedside this morning, he is resting comfortably. He shares that his bowels have improved though he has not moved as much so it's been tough to gauge. We reviewed the medications he has been getting since hospitalization may be a contributor to this.   Travian shares agreement to go to rehabilitation. He also agrees to OP Palliative support on discharge.   Questions and concerns addressed/Palliative Support Provided.   Objective Assessment: Vital Signs Vitals:   07/19/23 0718 07/19/23 1051  BP: (!) 170/68 (!) 146/68  Pulse: 73 61  Resp: 18 20  Temp: 98.5 F (36.9 C) 98.1 F (36.7 C)  SpO2: 97% 96%    Intake/Output Summary (Last 24 hours) at 07/19/2023 1309 Last data filed at 07/19/2023 1218 Gross per 24 hour  Intake 600 ml  Output 1280 ml  Net -680 ml   Last Weight  Most recent update: 07/19/2023  4:47 AM    Weight  78.1 kg (172 lb 2.9 oz)            Gen:  Elderly Caucasian M in NAD HEENT: moist mucous membranes CV: Regular rate and rhythm  PULM:  On RA, breathing is even and nonlabored ABD: soft/nontender  EXT: No edema  Neuro: Alert and oriented x3   SUMMARY OF RECOMMENDATIONS    DNAR/DNI   Living will obtained    TOC - OP Palliative support on discharge   The PMT will continue to follow along peripherally  Billing based on MDM: Low ______________________________________________________________________________________ [name]  Palliative Medicine Team Team Cell Phone: [phone] Please utilize secure chat with additional questions, if there is no response within 30 minutes please call the above phone number  Palliative Medicine Team providers are available by phone from 7am to 7pm daily and can be reached through the team cell phone.  Should this patient require assistance outside of these hours, please call the patient's attending physician.

## 2023-07-19 NOTE — Progress Notes (Signed)
 PROGRESS NOTE   Billy [name]  NGE:[id number]    DOB: 01-29-1951    DOA: 06/26/2023  PCP: [name], MD   I have briefly reviewed patients previous medical records in [facility].  Chief Complaint  Patient presents with   Cardiac Arrest    Brief Hospital Course:  73 y/o male with h/o CHF, CKD stage IV, Atrial Fibrillation, BPH, Anemia, carotid artery disease s/p endarterectomy and stenting, chornic rhinitis, HLD and RLS who went to his neighbors house after having SOB for at least 1 week and had a cardiac arrest. Neighbor stated CPR immediately and EMS continued . He got ROSC back after about 15 min. Then in the ED he arrested again for about 2-5 min and then achieved ROSC. Patient was admitted to the ICU.    Patient found to have cardiogenic shock and required vasopressors.  Echocardiogram with mild reduction in LV systolic function. Positive bilateral deep vein thrombosis.     Course of events: 03/13 direct current cardioversion for atrial fibrillation. Patient noted to have hematochezia with acute blood loss anemia.   03/15 EGD duodenitis.  03/16 IVC filter placement.  03/17 resume IV heparin for anticoagulation. 03/18 completed antibiotic therapy for pneumonia.  Patient transferred to the floor and [name] assumed care 03/20 recurrent lower gastrointestinal bleeding while on IV heparin. CT abdomen and pelvis with no active signs of bleeding. Positive bilateral pleural effusions, placed bilateral chest tubes.  03/21 no recurrent bleeding, hgb has been stable, 8,3 to 7.7, resumed heparin drip for anticoagulation.  03/22 bowel movement with dark brown red color, no low dose heparin.  03/ 23 left chest tube removed today. Positive right upper lobe infiltrate, consistent with aspiration pneumonia, started on antibiotic therapy.  03/24 continue very weak and deconditioned, but hemodynamically stable.  03/25 transfusing 1 unit PRBC today.  3/26: Overnight had hematochezia with 2 large  BMs with clots.  H&H stable, heparin drip held   Assessment & Plan:  Active Problems:   Acute DVT (deep venous thrombosis) (HCC)   Gastrointestinal hemorrhage with melena   CKD stage 3b, GFR 30-44 ml/min (HCC)   Aspiration pneumonia of right upper lobe (HCC)   PAF (paroxysmal atrial fibrillation) (HCC)   Hypertension   Hyperlipidemia   Cardiac arrest (HCC)   Pleural effusion   Lobar pneumonia (HCC)   Need for management of chest tube   Pleural effusion on right  Acute HF with midrange EF: - as per Cards> Initial echo: LVEF 45%, diffuse hypokinesis, severe LVH, normal RV size with moderate decrease systolic function, small pericardial effusion.  - However per Echo report on 3/13> 2D echo showed preserved LV systolic function with EF 55%, severe LVH, interventricular septum is flattened in systole and diastole, RV systolic function with severe reduction, RA size with mild enlargement, small pericardial effusion.  -TEE 3/13: LVEF 55%, severe LVH with mild RV dysfunction, small IVC. -Underwent cardiac cath on 3/19 : 3 vessel coronary artery disease with CTO RCA with L > R collaterals and moderate non obstructive coronary artery disease in the left system.  -Initially treated with IV Lasix and then transition to oral diuretics.  Heart failure medications being adjusted by HF cardiologist.  Getting torsemide 40 mg today (as needed dosing) with follow-up BMP this afternoon.  Spironolactone on hold.  Starting Sherryll Burger and Jardiance 4/2.  GDMT limited by CKD.   Acute on chronic cor pulmonale, pulmonary hypertension, RV failure Status post cardiac arrest, cardiogenic shock (resolved) -Continue hydralazine, torsemide held -  Cardiac MRI not consistent with cardiac amyloid.    Acute respiratory failure with hypoxia due to cardiogenic pulmonary edema Bilateral pleural effusions status post bilateral chest tube placement -Pleural fluid consistent with exudate per Light's criteria, high LDH and high  protein ratio. - left chest tube removed on 3/23, right chest tube removed on 3/27 -Acute respiratory failure resolved.  Saturating in the high 90s on room air.   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 Acute DVT involving the left proximal profunda vein and left proximal femoral vein. Indeterminate deep vein thrombosis involving the left popliteal vein, and left middle and distal femoral vein.  - V/Q scan 3/17 with multiple perfusion defects  -Status post IVC filter on 3/16 -Patient was on IV heparin drip, then developed hematochezia on 3/26 overnight and 3/27, heparin drip placed on hold.  GI consulted.   -Tolerating apixaban 5 Mg twice daily since 3/31.    Gastrointestinal hemorrhage with melena, acute blood loss anemia Iron deficiency anemia -Patient has been seen by GI this admission, EGD (Dr Marca Ancona) on 3/15 showed erosive gastropathy with no bleeding and no stigmata of recent bleeding. Bleeding friable duodenal mucosa, treated with hemostatic spray.  CTA was negative for active bleeding. -Iron profile showed FE 29, TIBC 234, percent saturation ratio 12, ferritin 39, will benefit from IV iron -No further GI bleed.  As per GI signoff note from 3/31, suspected source of bleeding is either diffuse GI tract oozing or from the localized duodenal oozing seen on prior recent endoscopy.  They feel risk of colonoscopy outweighs benefit but would consider it in the next few weeks. Clinically resolved.  Hemoglobin stable in the 8-9 g range.   CKD stage 3b, GFR 30-44 ml/min (HCC) -Baseline creatinine 2-2.2 -Creatinine labile, due to ongoing adjustment of diuretics and GDMT.  Down from 2.11-1.92 with plans to repeat this afternoon by cardiology.   Aspiration pneumonia of right upper lobe (HCC) New right  upper lobe infiltrate, consistent with aspiration  pneumonia.  Trach culture with rare gram positive cocci in pairs and clusters, no staph aureus or pseudomonas. -Completed IV Zosyn on 3/29     PAF (paroxysmal atrial fibrillation) (HCC) -Sp direct current cardioversion, patient in and out atrial fibrillation.  -Continue amiodarone and apixaban -H&H stable, cardiology following   Hypertension Continue hydralazine and Entresto being started.   Hyperlipidemia Continue statin and Zetia therapy.      Body mass index is 22.72 kg/m.  Nutritional Status Nutrition Problem: Inadequate oral intake Etiology: acute illness Signs/Symptoms: meal completion < 25% Interventions: Ensure Enlive (each supplement provides 350kcal and 20 grams of protein), Magic cup, MVI, Liberalize Diet, Snacks  As per registered dietitian note from Jul 22, 2023, no documentation codes for malnutrition applicable.  Agree with intervention and management as noted above.  Hypoalbuminemia/severe physical deconditioning Multifactorial.  Prolonged and complex hospital course Requires SNF for STR at DC.  Goals of care Palliative care continue to follow.  DNAR/DNI.?  Continue palliative care follow-up at SNF.  DVT prophylaxis: Place TED hose Start: 07/11/23 0919 SCDs Start: 06/26/23 2124     Code Status: Limited: Do not attempt resuscitation (DNR) -DNR-LIMITED -Do Not Intubate/DNI :  Family Communication: None at bedside Disposition:  Status is: Inpatient Remains inpatient appropriate because: Approaching medically stable for DC to SNF.  Family visiting SNF to make final decision.  Insurance authorization has been initiated on 22-Jul-2023 for  [facility].     Consultants:   PCCM Radiology [facility]/advanced HF team Palliative care medicine  Procedures:   As noted above  Antimicrobials:   Has no red above   Subjective:  Patient was interviewed and examined along with his RN in the room.  No acute issues overnight per nursing.  Patient himself denies any  complaints.  Specifically denied pain, shortness of breath or chest pain.  Reports that he has been eating well.  Objective:   Vitals:   07/19/23 0355 07/19/23 0413 07/19/23 0718 07/19/23 1051  BP: (!) 161/70  (!) 170/68 (!) 146/68  Pulse:   73 61  Resp: 19  18 20   Temp: 97.8 F (36.6 C)  98.5 F (36.9 C) 98.1 F (36.7 C)  TempSrc: Oral  Oral Oral  SpO2:   97% 96%  Weight:  78.1 kg    Height:  6\' 1"  (1.854 m)      General exam: Elderly male, moderately built and frail, somewhat chronically ill looking sitting up comfortably in bed without distress. Respiratory system: Clear to auscultation. Respiratory effort normal. Cardiovascular system: S1 & S2 heard, RRR. No JVD, murmurs, rubs, gallops or clicks. No pedal edema.  Telemetry personally reviewed: SR with BBB morphology.  Has bilateral forearm edema, right more than the left. Gastrointestinal system: Abdomen is nondistended, soft and nontender. No organomegaly or masses felt. Normal bowel sounds heard. Central nervous system: Alert and oriented. No focal neurological deficits. Extremities: Symmetric 5 x 5 power.  Bilateral lower extremity knee-high TED hoses. Skin: No rashes, lesions or ulcers Psychiatry: Judgement and insight appear normal. Mood & affect flat.     Data Reviewed:   I have personally reviewed following labs and imaging studies   CBC: Recent Labs  Lab 07/17/23 1102 07/18/23 0420 07/19/23 0543  WBC 8.4 7.1 8.2  HGB 9.1* 8.3* 9.3*  HCT 30.5* 27.9* 31.3*  MCV 83.6 83.0 83.5  PLT 307 282 293    Basic Metabolic Panel: Recent Labs  Lab 07/15/23 0314 07/16/23 0528 07/17/23 0441 07/18/23 0420 07/19/23 0543  NA 139 137 138 137 138  K 3.7 3.8 4.0 4.1 4.5  CL 103 107 107 107 110  CO2 27 25 25 24 23   GLUCOSE 117* 97 86 95 92  BUN 32* 26* 23 25* 23  CREATININE 2.23* 1.91* 1.85* 2.11* 1.92*  CALCIUM 8.2* 7.6* 8.1* 8.0* 8.1*  PHOS  --   --   --  3.2 3.2    Liver Function Tests: Recent Labs  Lab  07/18/23 0420 07/19/23 0543  ALBUMIN 1.6* 1.6*    CBG: No results for input(s): "GLUCAP" in the last 168 hours.  Microbiology Studies:   Recent Results (from the past 240 hours)  Expectorated Sputum Assessment w Gram Stain, Rflx to Resp Cult     Status: None   Collection Time: 07/09/23  6:37 PM   Specimen: Expectorated Sputum  Result Value Ref Range Status   Specimen Description EXPECTORATED SPUTUM  Final   Special Requests NONE  Final   Sputum evaluation   Final    THIS SPECIMEN IS ACCEPTABLE FOR SPUTUM CULTURE Performed at [facility], [address], [place], [place] [address]    Report Status 07/09/2023 FINAL  Final  Culture, Respiratory w Gram Stain     Status: None   Collection Time: 07/09/23  6:37 PM  Result Value Ref Range Status   Specimen Description EXPECTORATED SPUTUM  Final   Special Requests NONE  Reflexed from [id number]  Final   Gram Stain   Final    FEW WBC PRESENT, PREDOMINANTLY PMN FEW GRAM POSITIVE COCCI IN CLUSTERS    Culture   Final    Normal respiratory flora-no Staph aureus or Pseudomonas seen Performed at [facility], [address] [place], [place] [address]    Report Status 07/12/2023 FINAL  Final    Radiology Studies:  No results found.  Scheduled Meds:    amiodarone  200 mg Oral BID   apixaban  5 mg Oral BID   atorvastatin  80 mg Oral Daily   Chlorhexidine Gluconate Cloth  6 each Topical Daily   empagliflozin  10 mg Oral Daily   ezetimibe  10 mg Oral Daily   [name] butt cream   Topical BID   lactose free nutrition  237 mL Oral BID BM   lidocaine  1 patch Transdermal Q24H   multivitamin with minerals  1 tablet Oral Daily   pantoprazole  40 mg Oral BID   sacubitril-valsartan  1 tablet Oral BID   sertraline  50 mg Oral QHS   sodium chloride flush  10-40 mL Intracatheter Q12H   sodium chloride flush  3 mL Intravenous Q12H   torsemide  40 mg Oral Daily    Continuous Infusions:     LOS: 23 days     Marcellus Scott, MD,  FACP, [facility], [facility], [facility]   [facility] [place]      To contact the attending provider between 7A-7P or the covering provider during after hours 7P-7A, please log into the web site [email or web address] and access using universal [place] password for that web site. If you do not have the password, please call the hospital operator.  07/19/2023, 12:36 PM

## 2023-07-19 NOTE — Progress Notes (Addendum)
 Advanced Heart Failure Rounding Note  Cardiologist: Nanetta Batty, MD  Chief Complaint: RV failure Subjective:    SBP 160-170. Co-ox 81%. CVP 11. Net negative 1L. Weight significantly up.  Sitting up in bed. Says he guesses he is feeling alright, considering. No SOB, CP. Hands and fingers remain with edema. Working with PT/OT.  Events   -  3/10 admitted with OOH cardiac arrest. 20 min CPR. Shocked for VF - In/out AF -> IV amio - Echo EF 45% with RV dysfunction -> suspect PE. No CT with AKI - U/s upper and lower extremities with DVTs - V/Q 3/17 with multiple perfusion defects consistent with CXR findings. ->  IVC filter placed due to GIB - EGD (3/15) showed bleeding friable duodenal mucosa treated with hemostatic spray. No overt GI bleeding today, hgb 8.9.  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 Mild PAH with severe RV failure though cardiac output is preserved. Hep gtt restarted post-cath - Recurrent GIB 3/20. CT angio of abdomen showed no active bleeding. + large bilateral effusion - Bilateral CTs placed 3/20 (transudative)  - L chest tube removed 3/23, R chest tube removed 03/27 - 1 u RBCs 03/25 for bleeding. Heparin restarted 03/28  Objective:    Weight Range: 78.1 kg Body mass index is 22.72 kg/m.   Vital Signs: Temp:  [97.7 F (36.5 C)-98.5 F (36.9 C)] 98.5 F (36.9 C) (04/02 0718) Pulse Rate:  [63-73] 73 (04/02 0718) Resp:  [17-19] 18 (04/02 0718) BP: (148-170)/(65-76) 170/68 (04/02 0718) SpO2:  [97 %-98 %] 97 % (04/02 0718) Weight:  [78.1 kg] 78.1 kg (04/02 0413) Last BM Date : 07/18/23  Weight change: Filed Weights   07/17/23 0017 07/18/23 0459 07/19/23 0413  Weight: (P) 75 kg 75.3 kg 78.1 kg   Intake/Output:  Intake/Output Summary (Last 24 hours) at 07/19/2023 0800 Last data filed at 07/19/2023 0359 Gross per 24 hour  Intake 240 ml  Output 1280 ml  Net -1040 ml   Physical Exam    CVP 11 General: Weak appearing. No distress on RA Cardiac: JVP to midneck. S1 and S2 present. No murmurs or rub. Extremities: Warm and dry.  2+ BL upper extremity edema. + ted hose Neuro: Alert and oriented x3. Affect pleasant. Generalized weakness Lines/Devices:  L subclavian CVL     Telemetry   SR 70s (personally reviewed)  Labs    CBC Recent Labs    07/18/23 0420 07/19/23 0543  WBC 7.1 8.2  HGB 8.3* 9.3*  HCT 27.9* 31.3*  MCV 83.0 83.5  PLT 282 293   Basic Metabolic Panel Recent Labs    09/81/19 0420 07/19/23 0543  NA 137 138  K 4.1 4.5  CL 107 110  CO2 24 23  GLUCOSE 95 92  BUN 25* 23  CREATININE 2.11* 1.92*  CALCIUM 8.0* 8.1*  PHOS 3.2 3.2   Liver Function Tests Recent Labs    07/18/23 0420 07/19/23 0543  ALBUMIN 1.6* 1.6*   BNP: BNP (last 3 results) Recent Labs    03/09/23 1544 03/11/23 0218 06/27/23 1116  BNP 660.9* 491.3* 646.4*   Imaging   No results found.  Medications:    Scheduled Medications:  amiodarone  200 mg Oral BID   apixaban  5 mg Oral BID   atorvastatin  80 mg Oral Daily   Chlorhexidine Gluconate Cloth  6 each Topical Daily   ezetimibe  10 mg Oral Daily  [name] butt cream   Topical BID   lactose free nutrition  237 mL Oral BID BM   lidocaine  1 patch Transdermal Q24H   multivitamin with minerals  1 tablet Oral Daily   pantoprazole  40 mg Oral BID   sacubitril-valsartan  1 tablet Oral BID   sertraline  50 mg Oral QHS   sodium chloride flush  10-40 mL Intracatheter Q12H   sodium chloride flush  3 mL Intravenous Q12H   Infusions:   PRN Medications: acetaminophen, barrier cream, docusate sodium, hydrocortisone cream, HYDROmorphone (DILAUDID) injection, influenza vaccine adjuvanted, ondansetron (ZOFRAN) IV, mouth rinse, oxyCODONE, pneumococcal 20-valent conjugate vaccine, polyethylene glycol, sodium chloride flush, sodium chloride flush  Patient Profile   Billy [name] is a 73 yo male with PMH of HTN, HLD,  prior DVT, CVA, paroxysmal atrial fibrillation, CKD stage IIIb, carotid artery disease s/p endarterectomy, and chronic diastolic CHF. Admitted following out of hospital witnessed cardiac arrest.   Assessment/Plan   1. Cardiac arrest:  - Witnessed arrest with bystander then EMS CPR.  >20 minutes CPR.  He was shocked for VF.  Had additional 4-5 minutes PEA arrest in ER. ?PE with aspiration given bilateral DVTs.  - suspect due to PE. Cath stable CAD - VQ and LE/UE u/s + DVT  - No chest CT with renal failure  2. Shock, cardiogenic +/- septic component: Initial echo: EF 45%, diffuse HK, severe LVH, normal RV size with mod decreased systolic function, small pericardial effusion, dilated IVC. Suspicion for PE as above.  PNA on CXR, initial hypotension thought to be due to septic shock. Complete abx. - Resolved.  3. Acute HF with mid range EF: Prominent RV failure. Initial echo: EF 45%, diffuse HK, severe LVH, normal RV size with mod decreased systolic function, small pericardial effusion, dilated IVC. As above, worry for PE with RV failure. Baseline cardiomyopathy with severe LVH raises concern for cardiac amyloidosis.  TEE 3/13: EF 55%, severe LVH with mild RV dysfunction, small IVC.  - L/RHC 07/05/23: 3v CAD with CTO RCA with L->R collaterals and mod non-obstructive CAD in L system. Mild PAH with severe RV failure though CO is preserved.   - cMRI 07/07/23:  LVEF 52% RVEF 53%, not consistent with cardiac amyloid.  - Coox stable. Volume up on exam. CVP 11. Significant edema in BL upper extremities, d/w RN at bedside to lightly wrap.  - Given Torsemide 40 mg today. BMET this afternoon. - GDMT Previously limited by CKD.  - Increase Entresto to 49-51 mg bid. Plan to increase tonight if SBP has not come down. May need hydral.  - Start Jardiance 10 mg daily - Hold off on bb. - Iron sats low. Receiving IV iron dosing  4. Venous thromboembolism: Bilateral upper and lower DVTs, mixed acute and chronic. He is  not anticoagulated at home. With RV failure on echo, strong suspicion for PE.  - s/p infrarenal IVC placement 3/16.   - V/Q 3/17 with multiple perfusion defects consistent with CXR findings. - No CTA with AKI.  - BUE [place] with SVTs and DVT.  - Need for lifelong AC in setting of multiple DVT/Pes. GI following, plan for colonoscopy if recurrent bleeding. - Continue Eliquis 5 mg bid.  5. CAD: NSTEMI with HS-TnI 3045.  ?Demand ischemia from arrest vs ACS.  He has history of carotid disease. - L/RHC 07/05/23: 3v CAD with CTO RCA with L->R collaterals and moderate non-obstructive CAD in L system. Mild PAH with severe RV failure though CO is  preserved.   - Plavix stopped with GI bleeding.  - Atorvastatin 80 daily. On eliquis 5 mg bid. - No chest pain.   6. Carotid stenosis: h/o CVA.  TCAR in 6/24. Okay to remain off plavix at this point    7. Persistent atrial fibrillation: He was not on AC at home though has history of prior CVA.  AC stopped due to prior GI bleeds. He went into AF/RVR here, tolerated poorly.  TEE/DCCV on 3/13. SR on tele today.  - Continue amiodarone 200 mg bid + eliquis  8. AKI on CKD 3b: Suspect ATN in setting of hypotension/arrest.  Baseline Scr seems to be around 2.1. - Scr 2.4>trending down Cr 1.9 today.  9. ID: PCT 8.37 on 3/11, suspect aspiration PNA.   - He has completed course of ceftriaxone/doxycycline.  - Abx now complete.   10. Acute GI bleeding: Hematochezia with AC. Had this in the past with [facility] as well.  GI following.  Has had transfusion here. EGD showed bleeding friable duodenal mucosa treated with hemostatic spray.  Had recurrent bleeding 3/20. Heparin held. CT angio of abdomen showed no active bleeding. - Hgb stable on eliquis. Received IV Iron.    12. Pleural Effusions - s/p bilat CT placement per CCM 3/20 - now out - transudative  13. Severe deconditioning - PT/OT - Not a candidate for CIR as family unable to help once he's home. Plan for SNF at  discharge.   14. Severe protein-calorie malnutrition: Dietician consulted  15. GOC - multiple markers of poor prognosis - Palliative care consulted. He is a DNR  - plan for SNF at discharge  Length of stay: 23  [place] Durk Carmen, NP  8:00 AM
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
## 2023-07-19 NOTE — Progress Notes (Signed)
 [facility]                                                                              Quindon Denker, is a 73 y.o. male, DOB - 1950-08-09, ZOX:[id number] Admit date - 06/26/2023    Outpatient Primary MD for the patient is [name], MD  LOS - 23  days  Chief Complaint  Patient presents with   Cardiac Arrest       Brief summary  Patient is a 73 y/o male with h/o CHF, CKD stage IV, Atrial Fibrillation, BPH, Anemia, carotid artery disease s/p endarterectomy and stenting, chornic rhinitis, HLD and RLS who went to his neighbors house after having SOB for at least 1 week and had a cardiac arrest. Neighbor stated CPR immediately and EMS continued . He got ROSC back after about 15 min. Then in the ED he arrested again for about 2-5 min and then achieved ROSC. Patient was admitted to the ICU.    Patient found to have cardiogenic shock and required vasopressors.  Echocardiogram with mild reduction in LV systolic function. Positive bilateral deep vein thrombosis.     03/13 direct current cardioversion for atrial fibrillation. Patient noted to have hematochezia with acute blood loss anemia.   03/15 EGD duodenitis.  03/16 IVC filter placement.  03/17 resume IV heparin for anticoagulation. 03/18 completed antibiotic therapy for pneumonia.  Patient transferred to the floor and [name] assumed care 03/20 recurrent lower gastrointestinal bleeding while on IV heparin. CT abdomen and pelvis with no active signs of bleeding. Positive bilateral pleural effusions, placed bilateral chest tubes.  03/21 no recurrent bleeding, hgb has been stable, 8,3 to 7.7, resumed heparin drip for anticoagulation.  03/22 bowel movement with dark brown red color, no low dose heparin.  03/ 23 left chest tube removed today. Positive right upper lobe infiltrate, consistent with aspiration pneumonia, started on antibiotic therapy.  03/24 continue very weak and deconditioned, but hemodynamically stable.  03/25  transfusing 1 unit PRBC today.  3/26: Overnight had hematochezia with 2 large BMs with clots.  H&H stable, heparin drip held    Assessment & Plan    Acute on chronic diastolic CHF (congestive heart failure) (HCC) -2D echo showed preserved LV systolic function with EF 55%, severe LVH, interventricular septum is flattened in systole and diastole, RV systolic function with severe reduction, RA size with mild enlargement, small pericardial effusion.   -Underwent cardiac cath on 3/19 : 3 vessel CAD with CTO RCA with L > R collaterals and moderate non obstructive CAD in the left system.  - received IV Lasix on 3/25, started on torsemide on 3/26 -Continue hydralazine.  Spironolactone, torsemide held due to rising Cr.  -Cr now improving, 2.1, will follow CHF team recommendations regarding resuming diuretics. -GDMT per cardiology  Acute on chronic cor pulmonale, pulmonary hypertension, RV failure Status post cardiac arrest, cardiogenic shock (resolved) -Continue hydralazine -aldactone, torsemide held due to rising Cr - Cardiac MRI not consistent with cardiac amyloid.   Acute respiratory failure with hypoxia due to cardiogenic pulmonary edema Bilateral pleural effusions status post bilateral chest tube placement -Pleural fluid consistent with exudate per Light's criteria,  high LDH and high protein ratio. 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 Acute DVT involving the left proximal profunda vein and left proximal femoral vein. Indeterminate deep vein thrombosis involving the left popliteal vein, and left middle and distal femoral vein.  - V/Q scan 3/17 with multiple perfusion defects  -S/p IVC filter on 3/16 -Patient was on IV heparin drip, then developed hematochezia on 3/26  overnight and 3/27, heparin drip was placed on hold.  GI consulted.   - retrial of low-dose IV heparin again per cardiology on 3/28, transitioned to DOAC on 3/31 - H/H stable       Gastrointestinal hemorrhage with melena, acute blood loss anemia Iron deficiency anemia -Patient has been seen by GI this admission, EGD (Dr Marca Ancona) on 3/15 showed erosive gastropathy with no bleeding and no stigmata of recent bleeding. Bleeding friable duodenal mucosa, treated with hemostatic spray.  CTA was negative for active bleeding. -Iron profile showed FE 29, TIBC 234, percent saturation ratio 12, ferritin 39, s/p IV iron  -GI following, will consider colonoscopy if he continues to bleed. Per GI/ Dr Dulce Sellar, source likely diffuse GI tract and colonoscopy would add little or may have localized duodenal oozing, was not amenable to endoscopic treatment and is on PPI. GI will continue to follow in case needs colonoscopy.  - Now on apixaban, H/H stable   CKD stage 3b, GFR 30-44 ml/min (HCC) -Baseline creatinine 2-2.2 -Creatinine improving, worsened likely due to diuresis, creatinine plateaued at 2.4 on 3/28   Aspiration pneumonia of right upper lobe (HCC) New right  upper lobe infiltrate, consistent with aspiration pneumonia.  Trach culture with rare gram positive cocci in pairs and clusters, no staph aureus or pseudomonas. -Completed IV Zosyn on 3/29     PAF (paroxysmal atrial fibrillation) (HCC) -Sp direct current cardioversion, patient in and out atrial fibrillation.  -Continue amiodarone.   -Heparin was resumed on 3/28, now transitioned to apixaban on 3/31  -H&H stable, cardiology following   Hypertension Continue hydralazine   Hyperlipidemia Continue statin therapy.      Hypoalbuminemia, severe protein calorie malnutrition, severe deconditioning -Albumin level 1.8 on 3/22 Nutrition Problem: Inadequate oral intake Etiology: acute illness Signs/Symptoms: meal completion < 25% Interventions:  Ensure Enlive (each supplement provides 350kcal and 20 grams of protein), Magic cup, MVI, Liberalize Diet, Snacks  Estimated body mass index is 22.72 kg/m as calculated from the following:   Height as of this encounter: 6\' 1"  (1.854 m).   Weight as of this encounter: 78.1 kg.  Code Status: DNR  DVT Prophylaxis:  Place TED hose Start: 07/11/23 0919 SCDs Start: 06/26/23 2124 apixaban (ELIQUIS) tablet 5 mg   Level of Care: Level of care: Progressive Family Communication: Updated patient Disposition Plan:      Remains inpatient appropriate: Will need SNF.  Palliative medicine following for goals of care, overall poor prognosis, advanced age, multiple comorbidities and recurrent GI bleed with anticoagulation.   Procedures:  2D echo Chest tubes EGD 3/15  Consultants:   Cardiology PCCM Palliative medicine  GI   Antimicrobials:   Anti-infectives (From admission, onward)    Start     Dose/Rate Route Frequency Ordered Stop   07/11/23 1045  linezolid (ZYVOX) tablet 600 mg  Status:  Discontinued        600 mg Oral  Every 12 hours 07/11/23 0955 07/11/23 1457   07/09/23 1400  piperacillin-tazobactam (ZOSYN) IVPB 3.375 g        3.375 g 12.5 mL/hr over 240 Minutes Intravenous Every 8 hours 07/09/23 1019 07/16/23 0941   07/09/23 1045  linezolid (ZYVOX) IVPB 600 mg  Status:  Discontinued        600 mg 300 mL/hr over 60 Minutes Intravenous Every 12 hours 07/09/23 0946 07/11/23 0955   06/29/23 1000  cefTRIAXone (ROCEPHIN) 2 g in sodium chloride 0.9 % 100 mL IVPB        2 g 200 mL/hr over 30 Minutes Intravenous Every 24 hours 06/28/23 1015 07/01/23 1008   06/28/23 0915  Ampicillin-Sulbactam (UNASYN) 3 g in sodium chloride 0.9 % 100 mL IVPB  Status:  Discontinued        3 g 200 mL/hr over 30 Minutes Intravenous Every 12 hours 06/28/23 0822 06/28/23 1015   06/27/23 0915  cefTRIAXone (ROCEPHIN) 2 g in sodium chloride 0.9 % 100 mL IVPB  Status:  Discontinued        2 g 200 mL/hr over 30  Minutes Intravenous Every 24 hours 06/27/23 0828 06/28/23 0822   06/27/23 0915  doxycycline (VIBRAMYCIN) 100 mg in sodium chloride 0.9 % 250 mL IVPB        100 mg 125 mL/hr over 120 Minutes Intravenous Every 12 hours 06/27/23 0828 07/01/23 2310          Medications  amiodarone  200 mg Oral BID   apixaban  5 mg Oral BID   atorvastatin  80 mg Oral Daily   Chlorhexidine Gluconate Cloth  6 each Topical Daily   empagliflozin  10 mg Oral Daily   ezetimibe  10 mg Oral Daily   [name] butt cream   Topical BID   lactose free nutrition  237 mL Oral BID BM   lidocaine  1 patch Transdermal Q24H   multivitamin with minerals  1 tablet Oral Daily   pantoprazole  40 mg Oral BID   sacubitril-valsartan  1 tablet Oral BID   sertraline  50 mg Oral QHS   sodium chloride flush  10-40 mL Intracatheter Q12H   sodium chloride flush  3 mL Intravenous Q12H   torsemide  40 mg Oral Daily      Subjective:   Alamin Mccuiston was seen and examined today.  No acute issues overnight, stable, no further hematochezia. Very deconditioned. No chest pain, shortness of breath, fever chills nausea vomiting.   Objective:   Vitals:   07/19/23 0355 07/19/23 0413 07/19/23 0718 07/19/23 1051  BP: (!) 161/70  (!) 170/68 (!) 146/68  Pulse:   73 61  Resp: 19  18 20   Temp: 97.8 F (36.6 C)  98.5 F (36.9 C) 98.1 F (36.7 C)  TempSrc: Oral  Oral Oral  SpO2:   97% 96%  Weight:  78.1 kg    Height:  6\' 1"  (1.854 m)      Intake/Output Summary (Last 24 hours) at 07/19/2023 1243 Last data filed at 07/19/2023 1218 Gross per 24 hour  Intake 720 ml  Output 1280 ml  Net -560 ml     Wt Readings from Last 3 Encounters:  07/19/23 78.1 kg  03/27/23 81.2 kg  03/14/23 86 kg    Physical Exam General: Alert and oriented x 3, NAD, ill appearing  Cardiovascular: S1 S2 clear, RRR.  Respiratory: diminished BS at bases, no wheezing  Gastrointestinal: Soft, NT, ND, NBS Ext: + pedal edema bilaterally Neuro:  no new  deficits Skin: No rashes Psych: Normal affect     Data Reviewed:  I have personally reviewed following labs    CBC Lab Results  Component Value Date   WBC 8.2 07/19/2023   RBC 3.75 (L) 07/19/2023   HGB 9.3 (L) 07/19/2023   HCT 31.3 (L) 07/19/2023   MCV 83.5 07/19/2023   MCH 24.8 (L) 07/19/2023   PLT 293 07/19/2023   MCHC 29.7 (L) 07/19/2023   RDW 20.0 (H) 07/19/2023   LYMPHSABS 4.1 (H) 06/26/2023   MONOABS 0.7 06/26/2023   EOSABS 0.4 06/26/2023   BASOSABS 0.1 06/26/2023     Last metabolic panel Lab Results  Component Value Date   NA 138 07/19/2023   K 4.5 07/19/2023   CL 110 07/19/2023   CO2 23 07/19/2023   BUN 23 07/19/2023   CREATININE 1.92 (H) 07/19/2023   GLUCOSE 92 07/19/2023   GFRNONAA 37 (L) 07/19/2023   GFRAA 29 (L) 08/31/2019   CALCIUM 8.1 (L) 07/19/2023   PHOS 3.2 07/19/2023   PROT 5.0 (L) 07/08/2023   ALBUMIN 1.6 (L) 07/19/2023   LABGLOB 2.4 06/27/2023   AGRATIO 1.8 12/03/2015   BILITOT 0.5 07/08/2023   ALKPHOS 97 07/08/2023   AST 23 07/08/2023   ALT 58 (H) 07/08/2023   ANIONGAP 5 07/19/2023    CBG (last 3)  No results for input(s): "GLUCAP" in the last 72 hours.     Coagulation Profile: No results for input(s): "INR", "PROTIME" in the last 168 hours.   Radiology Studies: I have personally reviewed the imaging studies  No results found.      Thad Ranger M.D. [facility] 07/19/2023, 12:43 PM  Available via Epic secure chat 7am-7pm After 7 pm, please refer to night coverage provider listed on amion.

## 2023-07-19 NOTE — Plan of Care (Signed)
  Problem: Tissue Perfusion: Goal: Adequacy of tissue perfusion will improve Outcome: Progressing   Problem: Health Behavior/Discharge Planning: Goal: Ability to manage health-related needs will improve Outcome: Progressing   Problem: Clinical Measurements: Goal: Will remain free from infection Outcome: Progressing Goal: Respiratory complications will improve Outcome: Progressing Goal: Cardiovascular complication will be avoided Outcome: Progressing   Problem: Coping: Goal: Level of anxiety will decrease Outcome: Progressing   Problem: Safety: Goal: Ability to remain free from injury will improve Outcome: Progressing   Problem: Cardiovascular: Goal: Ability to achieve and maintain adequate cardiovascular perfusion will improve Outcome: Progressing

## 2023-07-19 NOTE — Telephone Encounter (Signed)
 Patient Product/process development scientist completed.    The patient is insured through [employer]/ [employer]. Patient has Medicare and is not eligible for a copay card, but may be able to apply for patient assistance or Medicare RX Payment Plan (Patient Must reach out to their plan, if eligible for payment plan), if available.    Ran test claim for Farxiga 10 mg and the current 30 day co-pay is $47.00.  Ran test claim for Jardiance 10 mg and the current 30 day co-pay is $47.00.  This test claim was processed through [facility]- copay amounts may vary at other pharmacies due to pharmacy/plan contracts, or as the patient moves through the different stages of their insurance plan.     Billy [name], CPHT Pharmacy Technician III Certified Patient Advocate [facility] Patient Advocate Team Direct Number: [phone]  Fax: [phone]

## 2023-07-20 ENCOUNTER — Other Ambulatory Visit (HOSPITAL_COMMUNITY): Payer: Self-pay | Admitting: Adult Health

## 2023-07-20 DIAGNOSIS — I130 Hypertensive heart and chronic kidney disease with heart failure and stage 1 through stage 4 chronic kidney disease, or unspecified chronic kidney disease: Secondary | ICD-10-CM | POA: Diagnosis not present

## 2023-07-20 DIAGNOSIS — K219 Gastro-esophageal reflux disease without esophagitis: Secondary | ICD-10-CM | POA: Diagnosis not present

## 2023-07-20 DIAGNOSIS — Z7901 Long term (current) use of anticoagulants: Secondary | ICD-10-CM | POA: Diagnosis not present

## 2023-07-20 DIAGNOSIS — I5032 Chronic diastolic (congestive) heart failure: Secondary | ICD-10-CM | POA: Diagnosis not present

## 2023-07-20 DIAGNOSIS — I10 Essential (primary) hypertension: Secondary | ICD-10-CM | POA: Diagnosis not present

## 2023-07-20 DIAGNOSIS — I82623 Acute embolism and thrombosis of deep veins of upper extremity, bilateral: Secondary | ICD-10-CM | POA: Diagnosis not present

## 2023-07-20 DIAGNOSIS — E785 Hyperlipidemia, unspecified: Secondary | ICD-10-CM | POA: Diagnosis not present

## 2023-07-20 DIAGNOSIS — Z8674 Personal history of sudden cardiac arrest: Secondary | ICD-10-CM | POA: Diagnosis not present

## 2023-07-20 DIAGNOSIS — I779 Disorder of arteries and arterioles, unspecified: Secondary | ICD-10-CM | POA: Diagnosis not present

## 2023-07-20 DIAGNOSIS — Z515 Encounter for palliative care: Secondary | ICD-10-CM | POA: Diagnosis not present

## 2023-07-20 DIAGNOSIS — I5033 Acute on chronic diastolic (congestive) heart failure: Secondary | ICD-10-CM | POA: Diagnosis not present

## 2023-07-20 DIAGNOSIS — Z993 Dependence on wheelchair: Secondary | ICD-10-CM | POA: Diagnosis not present

## 2023-07-20 DIAGNOSIS — E7800 Pure hypercholesterolemia, unspecified: Secondary | ICD-10-CM | POA: Diagnosis not present

## 2023-07-20 DIAGNOSIS — K921 Melena: Secondary | ICD-10-CM | POA: Diagnosis not present

## 2023-07-20 DIAGNOSIS — J9601 Acute respiratory failure with hypoxia: Secondary | ICD-10-CM | POA: Diagnosis not present

## 2023-07-20 DIAGNOSIS — Z8719 Personal history of other diseases of the digestive system: Secondary | ICD-10-CM | POA: Diagnosis not present

## 2023-07-20 DIAGNOSIS — D649 Anemia, unspecified: Secondary | ICD-10-CM | POA: Diagnosis not present

## 2023-07-20 DIAGNOSIS — I272 Pulmonary hypertension, unspecified: Secondary | ICD-10-CM | POA: Diagnosis not present

## 2023-07-20 DIAGNOSIS — I2781 Cor pulmonale (chronic): Secondary | ICD-10-CM | POA: Diagnosis not present

## 2023-07-20 DIAGNOSIS — I6521 Occlusion and stenosis of right carotid artery: Secondary | ICD-10-CM | POA: Diagnosis not present

## 2023-07-20 DIAGNOSIS — I252 Old myocardial infarction: Secondary | ICD-10-CM | POA: Diagnosis not present

## 2023-07-20 DIAGNOSIS — F32A Depression, unspecified: Secondary | ICD-10-CM | POA: Diagnosis not present

## 2023-07-20 DIAGNOSIS — I509 Heart failure, unspecified: Secondary | ICD-10-CM | POA: Diagnosis not present

## 2023-07-20 DIAGNOSIS — I959 Hypotension, unspecified: Secondary | ICD-10-CM | POA: Diagnosis not present

## 2023-07-20 DIAGNOSIS — I5023 Acute on chronic systolic (congestive) heart failure: Secondary | ICD-10-CM | POA: Diagnosis not present

## 2023-07-20 DIAGNOSIS — J918 Pleural effusion in other conditions classified elsewhere: Secondary | ICD-10-CM | POA: Diagnosis not present

## 2023-07-20 DIAGNOSIS — I8290 Acute embolism and thrombosis of unspecified vein: Secondary | ICD-10-CM | POA: Diagnosis not present

## 2023-07-20 DIAGNOSIS — I480 Paroxysmal atrial fibrillation: Secondary | ICD-10-CM | POA: Diagnosis not present

## 2023-07-20 DIAGNOSIS — D500 Iron deficiency anemia secondary to blood loss (chronic): Secondary | ICD-10-CM | POA: Diagnosis not present

## 2023-07-20 DIAGNOSIS — I82412 Acute embolism and thrombosis of left femoral vein: Secondary | ICD-10-CM | POA: Diagnosis not present

## 2023-07-20 DIAGNOSIS — Z7401 Bed confinement status: Secondary | ICD-10-CM | POA: Diagnosis not present

## 2023-07-20 DIAGNOSIS — M6281 Muscle weakness (generalized): Secondary | ICD-10-CM | POA: Diagnosis not present

## 2023-07-20 DIAGNOSIS — N1832 Chronic kidney disease, stage 3b: Secondary | ICD-10-CM | POA: Diagnosis not present

## 2023-07-20 DIAGNOSIS — I462 Cardiac arrest due to underlying cardiac condition: Secondary | ICD-10-CM | POA: Diagnosis not present

## 2023-07-20 DIAGNOSIS — I82531 Chronic embolism and thrombosis of right popliteal vein: Secondary | ICD-10-CM | POA: Diagnosis not present

## 2023-07-20 DIAGNOSIS — I2510 Atherosclerotic heart disease of native coronary artery without angina pectoris: Secondary | ICD-10-CM | POA: Diagnosis not present

## 2023-07-20 DIAGNOSIS — R0689 Other abnormalities of breathing: Secondary | ICD-10-CM | POA: Diagnosis not present

## 2023-07-20 DIAGNOSIS — R262 Difficulty in walking, not elsewhere classified: Secondary | ICD-10-CM | POA: Diagnosis not present

## 2023-07-20 DIAGNOSIS — J90 Pleural effusion, not elsewhere classified: Secondary | ICD-10-CM | POA: Diagnosis not present

## 2023-07-20 DIAGNOSIS — I3139 Other pericardial effusion (noninflammatory): Secondary | ICD-10-CM | POA: Diagnosis not present

## 2023-07-20 DIAGNOSIS — Z8673 Personal history of transient ischemic attack (TIA), and cerebral infarction without residual deficits: Secondary | ICD-10-CM | POA: Diagnosis not present

## 2023-07-20 DIAGNOSIS — R570 Cardiogenic shock: Secondary | ICD-10-CM | POA: Diagnosis not present

## 2023-07-20 DIAGNOSIS — G629 Polyneuropathy, unspecified: Secondary | ICD-10-CM | POA: Diagnosis not present

## 2023-07-20 DIAGNOSIS — J690 Pneumonitis due to inhalation of food and vomit: Secondary | ICD-10-CM | POA: Diagnosis not present

## 2023-07-20 DIAGNOSIS — J181 Lobar pneumonia, unspecified organism: Secondary | ICD-10-CM | POA: Diagnosis not present

## 2023-07-20 DIAGNOSIS — R531 Weakness: Secondary | ICD-10-CM | POA: Diagnosis not present

## 2023-07-20 DIAGNOSIS — Z79899 Other long term (current) drug therapy: Secondary | ICD-10-CM | POA: Diagnosis not present

## 2023-07-20 DIAGNOSIS — Z7902 Long term (current) use of antithrombotics/antiplatelets: Secondary | ICD-10-CM | POA: Diagnosis not present

## 2023-07-20 DIAGNOSIS — I469 Cardiac arrest, cause unspecified: Secondary | ICD-10-CM | POA: Diagnosis not present

## 2023-07-20 DIAGNOSIS — I82403 Acute embolism and thrombosis of unspecified deep veins of lower extremity, bilateral: Secondary | ICD-10-CM | POA: Diagnosis not present

## 2023-07-20 DIAGNOSIS — E1169 Type 2 diabetes mellitus with other specified complication: Secondary | ICD-10-CM | POA: Diagnosis not present

## 2023-07-20 LAB — CBC
HCT: 31.2 % — ABNORMAL LOW (ref 39.0–52.0)
Hemoglobin: 9.4 g/dL — ABNORMAL LOW (ref 13.0–17.0)
MCH: 25.1 pg — ABNORMAL LOW (ref 26.0–34.0)
MCHC: 30.1 g/dL (ref 30.0–36.0)
MCV: 83.4 fL (ref 80.0–100.0)
Platelets: 279 10*3/uL (ref 150–400)
RBC: 3.74 MIL/uL — ABNORMAL LOW (ref 4.22–5.81)
RDW: 20 % — ABNORMAL HIGH (ref 11.5–15.5)
WBC: 9.3 10*3/uL (ref 4.0–10.5)
nRBC: 0 % (ref 0.0–0.2)

## 2023-07-20 LAB — RENAL FUNCTION PANEL
Albumin: 1.5 g/dL — ABNORMAL LOW (ref 3.5–5.0)
Anion gap: 7 (ref 5–15)
BUN: 26 mg/dL — ABNORMAL HIGH (ref 8–23)
CO2: 25 mmol/L (ref 22–32)
Calcium: 8.1 mg/dL — ABNORMAL LOW (ref 8.9–10.3)
Chloride: 105 mmol/L (ref 98–111)
Creatinine, Ser: 1.96 mg/dL — ABNORMAL HIGH (ref 0.61–1.24)
GFR, Estimated: 36 mL/min — ABNORMAL LOW (ref >60)
Glucose, Bld: 104 mg/dL — ABNORMAL HIGH (ref 70–99)
Phosphorus: 3.4 mg/dL (ref 2.5–4.6)
Potassium: 4.5 mmol/L (ref 3.5–5.1)
Sodium: 137 mmol/L (ref 135–145)

## 2023-07-20 MED ORDER — OXYCODONE HCL 5 MG PO TABS: 5.0000 mg | ORAL_TABLET | Freq: Three times a day (TID) | ORAL | 0 refills | Status: DC | PRN

## 2023-07-20 MED ORDER — AMIODARONE HCL 200 MG PO TABS
200.0000 mg | ORAL_TABLET | Freq: Every day | ORAL | Status: DC
  Administered 2023-07-20: 200 mg via ORAL
  Filled 2023-07-20: qty 1

## 2023-07-20 MED ORDER — PANTOPRAZOLE SODIUM 40 MG PO TBEC: 40.0000 mg | DELAYED_RELEASE_TABLET | Freq: Two times a day (BID) | ORAL | Status: DC

## 2023-07-20 MED ORDER — SERTRALINE HCL 50 MG PO TABS: 50.0000 mg | ORAL_TABLET | Freq: Every day | ORAL | Status: DC

## 2023-07-20 MED ORDER — APIXABAN 5 MG PO TABS: 5.0000 mg | ORAL_TABLET | Freq: Two times a day (BID) | ORAL | Status: DC

## 2023-07-20 MED ORDER — ATORVASTATIN CALCIUM 80 MG PO TABS: 80.0000 mg | ORAL_TABLET | Freq: Every day | ORAL | Status: AC

## 2023-07-20 MED ORDER — DOCUSATE SODIUM 100 MG PO CAPS: 100.0000 mg | ORAL_CAPSULE | Freq: Two times a day (BID) | ORAL | Status: DC | PRN

## 2023-07-20 MED ORDER — EMPAGLIFLOZIN 10 MG PO TABS: 10.0000 mg | ORAL_TABLET | Freq: Every day | ORAL | Status: DC

## 2023-07-20 MED ORDER — AMIODARONE HCL 200 MG PO TABS: 200.0000 mg | ORAL_TABLET | Freq: Every day | ORAL | Status: DC

## 2023-07-20 MED ORDER — POLYETHYLENE GLYCOL 3350 17 G PO PACK: 17.0000 g | PACK | Freq: Every day | ORAL | Status: DC | PRN

## 2023-07-20 MED ORDER — ADULT MULTIVITAMIN W/MINERALS CH: 1.0000 | ORAL_TABLET | Freq: Every day | ORAL | Status: DC

## 2023-07-20 MED ORDER — LIDOCAINE 5 % EX PTCH: 1.0000 | MEDICATED_PATCH | CUTANEOUS | Status: DC

## 2023-07-20 MED ORDER — SACUBITRIL-VALSARTAN 97-103 MG PO TABS: 1.0000 | ORAL_TABLET | Freq: Two times a day (BID) | ORAL | Status: DC

## 2023-07-20 MED ORDER — TORSEMIDE 40 MG PO TABS: 40.0000 mg | ORAL_TABLET | Freq: Every day | ORAL | Status: DC

## 2023-07-20 MED ORDER — SACUBITRIL-VALSARTAN 97-103 MG PO TABS
1.0000 | ORAL_TABLET | Freq: Two times a day (BID) | ORAL | Status: DC
  Administered 2023-07-20: 1 via ORAL
  Filled 2023-07-20: qty 1

## 2023-07-20 MED ORDER — BOOST PLUS PO LIQD: 237.0000 mL | Freq: Two times a day (BID) | ORAL | Status: DC

## 2023-07-20 MED ORDER — ACETAMINOPHEN 325 MG PO TABS: 650.0000 mg | ORAL_TABLET | ORAL | Status: DC | PRN

## 2023-07-20 NOTE — TOC Progression Note (Addendum)
 Transition of Care [facility]) - Progression Note    Patient Details  Name: Billy [name] MRN: [id number] Date of Birth: [birth year]  [facility] CM/SW Contact  Nicanor Bake Phone Number: [phone] 07/20/2023, 9:23 AM  Clinical Narrative:  HF CSW received a call from [employer] and the patients insurance was approved. Reference # [id number].   Patients transportation was denied. CSW notified MD Dr. Waymon Amato via secure chat that the patients SNF auth was approved. However his transportation was denied and going to [employer]. They need to be contacted today before noon. Dr. Logan Bores is the point of contact and the number is [phone].  9:28 AM- HF CSW called and updated the patients son [name] about insurance status. Patients son stated that he will not be ablE to provide transportation today, nor doeS he feel like it is safe for him to transport the patient. Patients son wants to wait and see what comes of the P2P. CSW will update.   11:38 AM- HF CSW called and spoke with the patients and shared updates.    Per MD, patients transportation was approved. Waiting for [employer] to contact CSW with transportation reference number.   Transportation reference [id number]        Expected Discharge Plan: IP Rehab Facility Barriers to Discharge: Continued Medical Work up  Expected Discharge Plan and Services   Discharge Planning Services: CM Consult Post Acute Care Choice: IP Rehab Living arrangements for the past 2 months: Single Family Home                                       Social Determinants of Health (SDOH) Interventions SDOH Screenings   Food Insecurity: No Food Insecurity (06/27/2023)  Housing: Low Risk  (06/27/2023)  Transportation Needs: No Transportation Needs (06/27/2023)  Utilities: Not At Risk (06/27/2023)  Depression (PHQ2-9): Low Risk  (12/01/2020)  Social Connections: Socially Isolated (06/27/2023)  Tobacco Use: Low Risk  (07/01/2023)    Readmission  Risk Interventions    06/27/2023    1:52 PM 10/14/2022   11:29 AM  Readmission Risk Prevention Plan  Post Dischage Appt  Complete  Medication Screening  Complete  Transportation Screening Complete Complete  HRI or Home Care Consult Complete   Social Work Consult for Recovery Care Planning/Counseling Complete   Palliative Care Screening Not Applicable   Medication Review Oceanographer) Referral to Pharmacy

## 2023-07-20 NOTE — Progress Notes (Signed)
 Advanced Heart Failure Rounding Note  Cardiologist: Nanetta Batty, MD  Chief Complaint: RV failure Subjective:     Denies SOB.  Events   -  3/10 admitted with OOH cardiac arrest. 20 min CPR. Shocked for VF - In/out AF -> IV amio - Echo EF 45% with RV dysfunction -> suspect PE. No CT with AKI - U/s upper and lower extremities with DVTs - V/Q 3/17 with multiple perfusion defects consistent with CXR findings. ->  IVC filter placed due to GIB - EGD (3/15) showed bleeding friable duodenal mucosa treated with hemostatic spray. No overt GI bleeding today, hgb 8.9.  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 Mild PAH with severe RV failure though cardiac output is preserved. Hep gtt restarted post-cath - Recurrent GIB 3/20. CT angio of abdomen showed no active bleeding. + large bilateral effusion - Bilateral CTs placed 3/20 (transudative)  - L chest tube removed 3/23, R chest tube removed 03/27 - 1 u RBCs 03/25 for bleeding. Heparin restarted 03/28 -4/2 Entresto increased, jardiance started,  and torsemide restarted at 40 mg daily   Objective:    Weight Range: 82.6 kg Body mass index is 24.03 kg/m.   Vital Signs: Temp:  [97.7 F (36.5 C)-98.8 F (37.1 C)] 97.7 F (36.5 C) (04/03 0755) Pulse Rate:  [61-77] 73 (04/03 0755) Resp:  [16-20] 18 (04/03 0755) BP: (123-149)/(61-68) 149/64 (04/03 0755) SpO2:  [96 %-100 %] 97 % (04/03 0755) Weight:  [82.6 kg] 82.6 kg (04/03 0400) Last BM Date : 07/18/23  Weight change: Filed Weights   07/18/23 0459 07/19/23 0413 07/20/23 0400  Weight: 75.3 kg 78.1 kg 82.6 kg   Intake/Output:  Intake/Output Summary (Last 24 hours) at 07/20/2023 0810 Last data filed at 07/19/2023 1900 Gross per 24 hour  Intake 600 ml  Output 2500 ml  Net -1900 ml  CVP 3-4  Physical Exam  General:   No resp difficulty Neck: supple. no JVD.  Cor: PMI nondisplaced. Regular rate & rhythm. No rubs,  gallops or murmurs. Lungs: clear Abdomen: soft, nontender, nondistended.  Extremities: no cyanosis, clubbing, rash, edema Neuro: alert & oriented x3  Telemetry  SR 70-80s  Labs    CBC Recent Labs    07/19/23 0543 07/20/23 0408  WBC 8.2 9.3  HGB 9.3* 9.4*  HCT 31.3* 31.2*  MCV 83.5 83.4  PLT 293 279   Basic Metabolic Panel Recent Labs    95/28/41 0543 07/19/23 1200 07/20/23 0408  NA 138 136 137  K 4.5 4.2 4.5  CL 110 105 105  CO2 23 23 25   GLUCOSE 92 96 104*  BUN 23 22 26*  CREATININE 1.92* 1.94* 1.96*  CALCIUM 8.1* 8.2* 8.1*  PHOS 3.2  --  3.4   Liver Function Tests Recent Labs    07/19/23 0543 07/20/23 0408  ALBUMIN 1.6* 1.5*   BNP: BNP (last 3 results) Recent Labs    03/09/23 1544 03/11/23 0218 06/27/23 1116  BNP 660.9* 491.3* 646.4*   Imaging   No results found.  Medications:    Scheduled Medications:  amiodarone  200 mg Oral BID   apixaban  5 mg Oral BID   atorvastatin  80 mg Oral Daily   Chlorhexidine Gluconate Cloth  6 each Topical Daily   empagliflozin  10 mg Oral Daily   ezetimibe  10 mg Oral Daily   [name] butt cream   Topical BID   lactose free nutrition  237 mL Oral BID BM   lidocaine  1 patch Transdermal Q24H   multivitamin with minerals  1 tablet Oral Daily   pantoprazole  40 mg Oral BID   sacubitril-valsartan  1 tablet Oral BID   sertraline  50 mg Oral QHS   sodium chloride flush  10-40 mL Intracatheter Q12H   sodium chloride flush  3 mL Intravenous Q12H   torsemide  40 mg Oral Daily   Infusions:   PRN Medications: acetaminophen, barrier cream, docusate sodium, hydrocortisone cream, HYDROmorphone (DILAUDID) injection, influenza vaccine adjuvanted, ondansetron (ZOFRAN) IV, mouth rinse, oxyCODONE, pneumococcal 20-valent conjugate vaccine, polyethylene glycol, sodium chloride flush, sodium chloride flush  Patient Profile   Billy [name] is a 73 yo male with PMH of HTN, HLD, prior DVT, CVA, paroxysmal atrial fibrillation,  CKD stage IIIb, carotid artery disease s/p endarterectomy, and chronic diastolic CHF. Admitted following out of hospital witnessed cardiac arrest.   Assessment/Plan   1. Cardiac arrest:  - Witnessed arrest with bystander then EMS CPR.  >20 minutes CPR.  He was shocked for VF.  Had additional 4-5 minutes PEA arrest in ER. ?PE with aspiration given bilateral DVTs.  - suspect due to PE. Cath stable CAD - VQ and LE/UE u/s + DVT  - No chest CT with renal failure  2. Shock, cardiogenic +/- septic component: Initial echo: EF 45%, diffuse HK, severe LVH, normal RV size with mod decreased systolic function, small pericardial effusion, dilated IVC. Suspicion for PE as above.  PNA on CXR, initial hypotension thought to be due to septic shock. Complete abx. - Resolved.  3. Acute HF with mid range EF: Prominent RV failure. Initial echo: EF 45%, diffuse HK, severe LVH, normal RV size with mod decreased systolic function, small pericardial effusion, dilated IVC. As above, worry for PE with RV failure. Baseline cardiomyopathy with severe LVH raises concern for cardiac amyloidosis.  TEE 3/13: EF 55%, severe LVH with mild RV dysfunction, small IVC.  - L/RHC 07/05/23: 3v CAD with CTO RCA with L->R collaterals and mod non-obstructive CAD in L system. Mild PAH with severe RV failure though CO is preserved.   - cMRI 07/07/23:  LVEF 52% RVEF 53%, not consistent with cardiac amyloid.  - CVP 3-4 today. Continue Torsemide 40 mg today.  - GDMT Previously limited by CKD.  - Increase Entresto to 97-103 twice a day   - Continue Jardiance 10 mg daily - Hold off on bb. - Iron sats low. Given IV Iron.   4. Venous thromboembolism: Bilateral upper and lower DVTs, mixed acute and chronic. He is not anticoagulated at home. With RV failure on echo, strong suspicion for PE.  - s/p infrarenal IVC placement 3/16.   - V/Q 3/17 with multiple perfusion defects consistent with CXR findings. - No CTA with AKI.  - BUE [place] with SVTs and  DVT.  - Need for lifelong AC in setting of multiple DVT/Pes. GI following, plan for colonoscopy if recurrent bleeding. - Continue Eliquis 5 mg bid. - Hemoglobin stable.   5. CAD: NSTEMI with HS-TnI 3045.  ?Demand ischemia from arrest vs ACS.  He has history of carotid disease. - L/RHC 07/05/23: 3v CAD with CTO RCA with L->R collaterals and moderate non-obstructive CAD in L system. Mild PAH with severe RV failure though CO is preserved.   - Plavix stopped with GI bleeding.  - Atorvastatin 80 daily. On eliquis 5 mg bid. - No chest pain.   6. Carotid stenosis: h/o CVA.  TCAR in 6/24.  Okay to remain off plavix at this point    7. Persistent atrial fibrillation: He was not on AC at home though has history of prior CVA.  AC stopped due to prior GI bleeds. He went into AF/RVR here, tolerated poorly.  TEE/DCCV on 3/13.  Maintaining SR. Cut back amio 200 mg daily.  - Continue eliqiuis 5 mg twice a day.   8. AKI on CKD 3b: Suspect ATN in setting of hypotension/arrest.  Baseline Scr seems to be around 2.1. - Scr 2.4>stable at 1.9   9. ID: PCT 8.37 on 3/11, suspect aspiration PNA.   - He has completed course of ceftriaxone/doxycycline.  - Abx now complete.   10. Acute GI bleeding: Hematochezia with AC. Had this in the past with [facility] as well.  GI following.  Has had transfusion here. EGD showed bleeding friable duodenal mucosa treated with hemostatic spray.  Had recurrent bleeding 3/20. Heparin held. CT angio of abdomen showed no active bleeding. - Hgb stable on eliquis. Received IV Iron.    12. Pleural Effusions - s/p bilat CT placement per CCM 3/20 - now out - transudative  13. Severe deconditioning - PT/OT - Not a candidate for CIR as family unable to help once he's home. Plan for SNF at discharge.   14. Severe protein-calorie malnutrition: Dietician consulted  15. GOC - multiple markers of poor prognosis - Palliative care consulted. He is a DNR  - plan for SNF at discharge  Stable from  HF perspective.   Length of stay: 24  Tonye Becket, NP  8:10 AM

## 2023-07-20 NOTE — TOC Transition Note (Signed)
 Transition of Care [facility]) - Discharge Note   Patient Details  Name: Billy [name] MRN: [id number] Date of Birth: 1950-10-14  Transition of Care [facility] CM/SW Contact:  Elliot Cousin, RN Phone Number: [phone] 07/20/2023, 11:45 AM   Clinical Narrative:    Referral for GI follow up, appt scheduled with [facility] for May 8 at 9 am. Offered choice for outpatient Palliative. Medicare.gov list placed on chart and provide to pt. Agreeable to [facility] for outpatient Palliative services.    Final next level of care: Skilled Nursing Facility Barriers to Discharge: No Barriers Identified   Patient Goals and CMS Choice Patient states their goals for this hospitalization and ongoing recovery are:: wants to get better CMS Medicare.gov Compare Post Acute Care list provided to:: Patient Choice offered to / list presented to : Patient      Discharge Placement                       Discharge Plan and Services Additional resources added to the After Visit Summary for     Discharge Planning Services: CM Consult Post Acute Care Choice: IP Rehab                               Social Drivers of Health (SDOH) Interventions SDOH Screenings   Food Insecurity: No Food Insecurity (06/27/2023)  Housing: Low Risk  (06/27/2023)  Transportation Needs: No Transportation Needs (06/27/2023)  Utilities: Not At Risk (06/27/2023)  Depression (PHQ2-9): Low Risk  (12/01/2020)  Social Connections: Socially Isolated (06/27/2023)  Tobacco Use: Low Risk  (07/01/2023)     Readmission Risk Interventions    06/27/2023    1:52 PM 10/14/2022   11:29 AM  Readmission Risk Prevention Plan  Post Dischage Appt  Complete  Medication Screening  Complete  Transportation Screening Complete Complete  HRI or Home Care Consult Complete   Social Work Consult for Recovery Care Planning/Counseling Complete   Palliative Care Screening Not Applicable   Medication Review Oceanographer) Referral to Pharmacy
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
## 2023-07-20 NOTE — Discharge Summary (Signed)
 Physician Discharge Summary  Billy [name] ZOX:[id number] DOB: 1950/11/10  PCP: [name], MD  Admitted from: Home Discharged to: SNF  Admit date: 06/26/2023 Discharge date: 07/20/2023  Recommendations for Outpatient Follow-up:    Follow-up Information     [facility] Follow up on 07/27/2023.   Specialty: Cardiology Why: Heart Failure Clinic 0900 am Contact information: [address] [place] [address] [phone]        [facility] Follow up.   Specialty: Hospice and Palliative Medicine Why: Outpatient Palliative Services Contact information: [address] [place] [address] [phone]        MD at SNF Follow up.   Why: To be seen in 3 to 4 days with repeat labs (CBC & BMP).        [name], MD. Schedule an appointment as soon as possible for a visit.   Specialty: Family Medicine Why: To be seen upon discharge from SNF. Contact information: [address] [place] [address] [phone]         Gastroenterology, Deboraha Sprang. Schedule an appointment as soon as possible for a visit.   Why: Aug 24, 2023 at 9 am, with Dr Melene Plan information: [address] [place] [address] [phone]                  Home Health: None    Equipment/Devices: TBD at SNF.    Discharge Condition: Improved and stable.   Code Status: Limited: Do not attempt resuscitation (DNR) -DNR-LIMITED -Do Not Intubate/DNI  Diet recommendation:  Discharge Diet Orders (From admission, onward)     Start     Ordered   07/20/23 0000  Diet - low sodium heart healthy        07/20/23 1141             Discharge Diagnoses:  Active Problems:   Acute DVT (deep venous thrombosis) (HCC)   Gastrointestinal hemorrhage with melena   CKD stage 3b, GFR 30-44 ml/min (HCC)   Aspiration pneumonia of right upper lobe (HCC)   PAF (paroxysmal atrial  fibrillation) (HCC)   Hypertension   Hyperlipidemia   Cardiac arrest (HCC)   Pleural effusion   Lobar pneumonia (HCC)   Need for management of chest tube   Pleural effusion on right   Brief Hospital Course:  73 y/o male with h/o CHF, CKD stage IV, Atrial Fibrillation, BPH, Anemia, carotid artery disease s/p endarterectomy and stenting, chornic rhinitis, HLD and RLS who went to his neighbors house after having SOB for at least 1 week and had a cardiac arrest. Neighbor stated CPR immediately and EMS continued . He got ROSC back after about 15 min. Then in the ED he arrested again for about 2-5 min and then achieved ROSC. Patient was admitted to the ICU.    Patient found to have cardiogenic shock and required vasopressors.  Echocardiogram with mild reduction in LV systolic function. Positive bilateral deep vein thrombosis.     Course of events: 03/13 direct current cardioversion for atrial fibrillation. Patient noted to have hematochezia with acute blood loss anemia.   03/15 EGD duodenitis.  03/16 IVC filter placement.  03/17 resume IV heparin for anticoagulation. 03/18 completed antibiotic therapy for pneumonia.  Patient transferred to the floor and [name] assumed care 03/20 recurrent lower gastrointestinal bleeding while on IV heparin. CT abdomen and pelvis with no active signs of bleeding. Positive bilateral pleural effusions, placed  bilateral chest tubes.  03/21 no recurrent bleeding, hgb has been stable, 8,3 to 7.7, resumed heparin drip for anticoagulation.  03/22 bowel movement with dark brown red color, no low dose heparin.  03/ 23 left chest tube removed today. Positive right upper lobe infiltrate, consistent with aspiration pneumonia, started on antibiotic therapy.  03/24 continue very weak and deconditioned, but hemodynamically stable.  03/25 transfusing 1 unit PRBC today.  3/26: Overnight had hematochezia with 2 large BMs with clots.  H&H stable, heparin drip held     Assessment  & Plan:     Acute HF with midrange EF: - as per Cards> Initial echo: LVEF 45%, diffuse hypokinesis, severe LVH, normal RV size with moderate decrease systolic function, small pericardial effusion.  - However per Echo report on 3/13> 2D echo showed preserved LV systolic function with EF 55%, severe LVH, interventricular septum is flattened in systole and diastole, RV systolic function with severe reduction, RA size with mild enlargement, small pericardial effusion.  -TEE 3/13: LVEF 55%, severe LVH with mild RV dysfunction, small IVC. -Underwent cardiac cath on 3/19 : 3 vessel coronary artery disease with CTO RCA with L > R collaterals and moderate non obstructive coronary artery disease in the left system.  -Initially treated with IV Lasix and then transitioned to oral diuretics.  Heart failure medications were adjusted by HF cardiologist.  Now on heart failure medications as below  Torsemide 40 Mg daily Entresto 97-103 twice a day Jardiance 10 Mg daily GDMT limited by CKD.  Heart failure Cardiology team have cleared patient for DC and have arranged close outpatient follow-up.   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 - Cardiac MRI not consistent with cardiac amyloid.    Acute respiratory failure with hypoxia due to cardiogenic pulmonary edema Bilateral pleural effusions status post bilateral chest tube placement -Pleural fluid consistent with exudate per Light's criteria, high LDH and high protein ratio. - left chest tube removed on 3/23, right chest tube removed on 3/27 -Acute respiratory failure resolved.  Saturating in the high 90s on room air.   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 Acute DVT involving the left  proximal profunda vein and left proximal femoral vein. Indeterminate deep vein thrombosis involving the left popliteal vein, and left middle and distal femoral vein.  - V/Q scan 3/17 with multiple perfusion defects  -Status post IVC filter on 3/16 -Patient was on IV heparin drip, then developed hematochezia on 3/26 overnight and 3/27, heparin drip placed on hold.  GI consulted.   -Tolerating apixaban 5 Mg twice daily since 3/31, continue at discharge.  Antiplatelets discontinued.    Gastrointestinal hemorrhage with melena, acute blood loss anemia Iron deficiency anemia -Patient has been seen by GI this admission, EGD (Dr Marca Ancona) on 3/15 showed erosive gastropathy with no bleeding and no stigmata of recent bleeding. Bleeding friable duodenal mucosa, treated with hemostatic spray.  CTA was negative for active bleeding. -Iron profile showed FE 29, TIBC 234, percent saturation ratio 12, ferritin 39, will benefit from IV iron -No further GI bleed.  As per GI signoff note from 3/31, suspected source of bleeding is either diffuse GI tract oozing or from the localized duodenal oozing seen on prior recent endoscopy.  They feel risk of colonoscopy outweighs benefit but would consider it in the  next few weeks. Clinically resolved.  Hemoglobin stable in the 8-9 g range. Outpatient follow-up with [facility] has been arranged.   CKD stage 3b, GFR 30-44 ml/min (HCC) -Baseline creatinine 2-2.2 -Creatinine labile, due to ongoing adjustment of diuretics and GDMT.  Down from 2.11-1.92.  Creatinine stable in the 1.9 range for the last 2 days.  Follow BMP closely at SNF.   Aspiration pneumonia of right upper lobe (HCC) New right  upper lobe infiltrate, consistent with aspiration pneumonia.  Trach culture with rare gram positive cocci in pairs and clusters, no staph aureus or pseudomonas. -Completed IV Zosyn on 3/29    PAF (paroxysmal atrial fibrillation) (HCC) -Sp direct current cardioversion, patient in and out  atrial fibrillation.  -Continue amiodarone and apixaban -H&H stable, cardiology following   Hypertension Continue Entresto.  Controlled.   Hyperlipidemia Continue statin and Zetia therapy.    Body mass index is 22.72 kg/m.   Nutritional Status Nutrition Problem: Inadequate oral intake Etiology: acute illness Signs/Symptoms: meal completion < 25% Interventions: Ensure Enlive (each supplement provides 350kcal and 20 grams of protein), Magic cup, MVI, Liberalize Diet, Snacks   As per registered dietitian note from August 05, 2023, no documentation codes for malnutrition applicable.  Agree with intervention and management as noted above.   Hypoalbuminemia/severe physical deconditioning Multifactorial.  Prolonged and complex hospital course Discharging to SNF for STR.   Goals of care Palliative care continue to follow.  DNAR/DNI.  Continue palliative care follow-up at SNF.     Consultants:   PCCM Radiology [facility] Cardiology/advanced HF team Palliative care medicine   Procedures:   As noted above   Antimicrobials:   None at DC     Discharge Instructions  Discharge Instructions     (HEART FAILURE PATIENTS) Call MD:  Anytime you have any of the following symptoms: 1) 3 pound weight gain in 24 hours or 5 pounds in 1 week 2) shortness of breath, with or without a dry hacking cough 3) swelling in the hands, feet or stomach 4) if you have to sleep on extra pillows at night in order to breathe.   Complete by: As directed    Call MD for:  difficulty breathing, headache or visual disturbances   Complete by: As directed    Call MD for:  extreme fatigue   Complete by: As directed    Call MD for:  persistant dizziness or light-headedness   Complete by: As directed    Call MD for:  persistant nausea and vomiting   Complete by: As directed    Call MD for:  severe uncontrolled pain   Complete by: As directed    Call MD for:  temperature >100.4   Complete by: As directed    Diet - low  sodium heart healthy   Complete by: As directed    Increase activity slowly   Complete by: As directed    No wound care   Complete by: As directed         Medication List     STOP taking these medications    aspirin EC 81 MG tablet   carvedilol 25 MG tablet Commonly known as: COREG   clopidogrel 75 MG tablet Commonly known as: PLAVIX   fluticasone 50 MCG/ACT nasal spray Commonly known as: FLONASE   furosemide 20 MG tablet Commonly known as: LASIX   furosemide 40 MG tablet Commonly known as: LASIX   hydrALAZINE 50 MG tablet Commonly known as: APRESOLINE   spironolactone 25 MG tablet Commonly  known as: ALDACTONE       TAKE these medications    acetaminophen 325 MG tablet Commonly known as: TYLENOL Take 2 tablets (650 mg total) by mouth every 4 (four) hours as needed for headache or mild pain (pain score 1-3).   amiodarone 200 MG tablet Commonly known as: PACERONE Take 1 tablet (200 mg total) by mouth daily. Start taking on: July 21, 2023   apixaban 5 MG Tabs tablet Commonly known as: ELIQUIS Take 1 tablet (5 mg total) by mouth 2 (two) times daily.   atorvastatin 80 MG tablet Commonly known as: LIPITOR Take 1 tablet (80 mg total) by mouth daily. What changed: when to take this   docusate sodium 100 MG capsule Commonly known as: COLACE Take 1 capsule (100 mg total) by mouth 2 (two) times daily as needed for mild constipation.   empagliflozin 10 MG Tabs tablet Commonly known as: JARDIANCE Take 1 tablet (10 mg total) by mouth daily. Start taking on: July 21, 2023   ezetimibe 10 MG tablet Commonly known as: Zetia Take 1 tablet (10 mg total) by mouth daily.   lactose free nutrition Liqd Take 237 mLs by mouth 2 (two) times daily between meals.   lidocaine 5 % Commonly known as: LIDODERM Place 1 patch onto the skin daily. Remove & Discard patch within 12 hours or as directed by MD. Place on Right Hip. Start taking on: July 21, 2023    multivitamin with minerals Tabs tablet Take 1 tablet by mouth daily. Start taking on: July 21, 2023   oxyCODONE 5 MG immediate release tablet Commonly known as: Oxy IR/ROXICODONE Take 1 tablet (5 mg total) by mouth every 8 (eight) hours as needed for moderate pain (pain score 4-6) or severe pain (pain score 7-10).   pantoprazole 40 MG tablet Commonly known as: PROTONIX Take 1 tablet (40 mg total) by mouth 2 (two) times daily before a meal. What changed: when to take this   polyethylene glycol 17 g packet Commonly known as: MIRALAX / GLYCOLAX Take 17 g by mouth daily as needed for moderate constipation.   sacubitril-valsartan 97-103 MG Commonly known as: ENTRESTO Take 1 tablet by mouth 2 (two) times daily.   sertraline 50 MG tablet Commonly known as: ZOLOFT Take 1 tablet (50 mg total) by mouth at bedtime.   Torsemide 40 MG Tabs Take 40 mg by mouth daily. Start taking on: July 21, 2023       Allergies  Allergen Reactions   Amlodipine Swelling    Excessive swelling and skin blotching    Morphine And Codeine Itching and Other (See Comments)    "Cannot handle it"      Procedures/Studies: DG CHEST PORT 1 VIEW Result Date: 07/11/2023 CLINICAL DATA:  Chest tube in place. EXAM: PORTABLE CHEST 1 VIEW COMPARISON:  Chest radiograph dated 07/10/2023. FINDINGS: Similar positioning of the right chest tube. Left subclavian central venous line in similar position. No significant interval change in the bilateral pulmonary opacities and small pleural effusions. No pneumothorax. Stable cardiac silhouette. No acute osseous pathology. IMPRESSION: No significant interval change. Electronically Signed   By: Elgie Collard M.D.   On: 07/11/2023 10:11   DG CHEST PORT 1 VIEW Result Date: 07/10/2023 CLINICAL DATA:  [id number].  Chest tube in place. EXAM: PORTABLE CHEST 1 VIEW COMPARISON:  Portable chest yesterday at 8:27 a.m. FINDINGS: 5:06 a.m. Interval removal of left basal pigtail chest tube.  No measurable pneumothorax. Pigtail right tube thoracostomy in the lateral base is  unchanged in positioning with no measurable pneumothorax. There are small pleural effusions. Interstitial and patchy hazy/coarsely nodular opacities of the right lung fields and left base persist unchanged. Left mid/upper lung field is clear. The left subclavian central line tip remains in distal SVC. Loop recorder device superimposes on the left. There is stable cardiomegaly. Central vessels are normal caliber. Stable mediastinum. There is aortic atherosclerosis.  No new osseous findings. IMPRESSION: 1. Interval removal of left pigtail chest tube. No measurable pneumothorax. 2. Stable right pigtail chest tube with no measurable pneumothorax. 3. Stable small pleural effusions. 4. Stable interstitial and patchy hazy/coarsely nodular opacities of the right lung fields and left base. 5. Cardiomegaly and aortic atherosclerosis. Electronically Signed   By: Almira Bar M.D.   On: 07/10/2023 06:23   DG CHEST PORT 1 VIEW Result Date: 07/09/2023 CLINICAL DATA:  Chest tube. EXAM: PORTABLE CHEST 1 VIEW COMPARISON:  07/08/2023 and CT chest 06/29/2017. FINDINGS: Trachea is midline. Heart is enlarged, stable. Loop recorder in place over the left chest. Left subclavian central line tip is in the SVC. Small bore chest tubes in both lower hemithoraces. No pneumothorax. Small loculated left pleural effusion. No pneumothorax. Mixed interstitial and airspace opacification bilaterally, right greater than left, unchanged from 07/08/2023. IMPRESSION: 1. Bilateral chest tubes in place. Small loculated left pleural effusion, stable. No pneumothorax. 2. Patchy mixed interstitial and airspace opacification, right greater than left, stable, possibly due to an atypical or viral pneumonia. Electronically Signed   By: Leanna Battles M.D.   On: 07/09/2023 10:00   DG CHEST PORT 1 VIEW Result Date: 07/08/2023 CLINICAL DATA:  73 year old male with chest tube,  pleural effusion. EXAM: PORTABLE CHEST 1 VIEW COMPARISON:  Portable chest yesterday and earlier. FINDINGS: Portable AP semi upright view at 0518 hours. The right side pigtail chest tube has become on looped since 07/06/2023. Evidence of small volume right pleural effusion at the lung base there. Mild fluid possible also at the right minor fissure. No pneumothorax. Left chest tube is stable over these recent exams. Small volume left pleural effusion suspected. No pneumothorax identified. Stable left subclavian approach central line. Stable cardiac size and mediastinal contours. Left chest cardiac loop recorder or superficial ICD. Nonspecific streaky right upper lung opacity persists. No areas of worsening ventilation. Stable left neck surgical clips. Visualized tracheal air column is within normal limits. No acute osseous abnormality identified. IMPRESSION: 1. Right pleural catheter is becoming un-looped. Stable left pleural catheter. Small volume residual effusions suspected. No pneumothorax. 2. Streaky right upper lung opacity. No new cardiopulmonary abnormality. Electronically Signed   By: Odessa Fleming M.D.   On: 07/08/2023 08:53   MR CARDIAC MORPHOLOGY W WO CONTRAST Result Date: 07/07/2023 CLINICAL DATA:  Clinical question of cardiac amyloidosis. EXAM: CARDIAC MRI TECHNIQUE: The patient was scanned on a 1.5 Tesla GE magnet. A dedicated cardiac coil was used. Functional imaging was done using Fiesta sequences. 2,3, and 4 chamber views were done to assess for RWMA's. Modified Simpson's rule using a short axis stack was used to calculate an ejection fraction on a dedicated work [employer], [employer]. The patient received 10 cc of Gadavist. After 10 minutes inversion recovery sequences were used to assess for infiltration and scar tissue. Flow quantification was performed 2 times during this examination with flow quantification performed at the levels of the ascending aorta above the valve, pulmonary artery  above the valve. CONTRAST:  10 cc  of Gadavist FINDINGS: 1. Mild left ventricular dilation, with LVEDD 52 mm,  but LVEDVi 91 mL/m2. Severe septal hypertrophy 15 mm. No systolic anterior motion of the mitral valve. Papillary muscle hypertrophy. Low normal left ventricular systolic function (LVEF =52%). Basal and mid inferolateral hypokinesis and thinning of myocardium (without notable scar). Left ventricular parametric mapping notable for normal T2. ECV elevated, severely (60%) in the mid- inferolateral wall at worst. There is no late gadolinium enhancement in the left ventricular myocardium. Six standard deviation used. 2.  Dilated right ventricle, with RVEDVI 95 mL/m2. Normal right ventricular thickness. Normal right ventricular systolic function (RVEF =53%). 3. Mild left atrial dilation. Severe right atrial dilation, maximal right atrial indexed volume 79.85 ml/m2. 4. Normal size of the aortic root, ascending aorta. Mild main pulmonary artery dilation, 28 mm. 5. Valve assessment: Aortic Valve: Tri-leaflet aortic valve. Qualitatively there is no significant regurgitation. Regurgitant fraction 2%. Gradient 2 mm Hg. Pulmonic Valve: Qualitatively, there is no significant regurgitation. Regurgitant fraction 1%. Tricuspid Valve: Unclear morphology. Moderate regurgitation. Regurgitant fraction 22%. Mitral Valve: Mild regurgitation. Regurgitant fraction 12%. 6.  Normal pericardium.  No pericardial effusion. 7. Small bilateral pleural effusions. Bilateral patchy ground-glass opacification of the aerated lung fields, most prominent on the right side. Recommended dedicated study if concerned for non-cardiac pathology. IMPRESSION: 1. Study is less consistent with cardiac amyloidosis or infiltrative disease. Study is more consistent with inferolateral territory RCA or LCX disease (with ECV elevation but without notable scar). Though septum is 15 mm, there are no other class features consistent with hypertrophic cardiomyopathy.  2.  Low normal LVEF (52%) with mild LV dilation. 3.  Right ventricular dilation with normalization of function. 4. Severe right atrial dilation. Mild pulmonary artery dilation. Moderate tricuspid regurgitation. Riley Lam MD Electronically Signed   By: Riley Lam M.D.   On: 07/07/2023 16:18   MR CARDIAC VELOCITY FLOW MAP Result Date: 07/07/2023 CLINICAL DATA:  Clinical question of cardiac amyloidosis. EXAM: CARDIAC MRI TECHNIQUE: The patient was scanned on a 1.5 Tesla GE magnet. A dedicated cardiac coil was used. Functional imaging was done using Fiesta sequences. 2,3, and 4 chamber views were done to assess for RWMA's. Modified Simpson's rule using a short axis stack was used to calculate an ejection fraction on a dedicated work [employer], [employer]. The patient received 10 cc of Gadavist. After 10 minutes inversion recovery sequences were used to assess for infiltration and scar tissue. Flow quantification was performed 2 times during this examination with flow quantification performed at the levels of the ascending aorta above the valve, pulmonary artery above the valve. CONTRAST:  10 cc  of Gadavist FINDINGS: 1. Mild left ventricular dilation, with LVEDD 52 mm, but LVEDVi 91 mL/m2. Severe septal hypertrophy 15 mm. No systolic anterior motion of the mitral valve. Papillary muscle hypertrophy. Low normal left ventricular systolic function (LVEF =52%). Basal and mid inferolateral hypokinesis and thinning of myocardium (without notable scar). Left ventricular parametric mapping notable for normal T2. ECV elevated, severely (60%) in the mid- inferolateral wall at worst. There is no late gadolinium enhancement in the left ventricular myocardium. Six standard deviation used. 2.  Dilated right ventricle, with RVEDVI 95 mL/m2. Normal right ventricular thickness. Normal right ventricular systolic function (RVEF =53%). 3. Mild left atrial dilation. Severe right atrial dilation, maximal  right atrial indexed volume 79.85 ml/m2. 4. Normal size of the aortic root, ascending aorta. Mild main pulmonary artery dilation, 28 mm. 5. Valve assessment: Aortic Valve: Tri-leaflet aortic valve. Qualitatively there is no significant regurgitation. Regurgitant fraction 2%. Gradient 2 mm Hg. Pulmonic  Valve: Qualitatively, there is no significant regurgitation. Regurgitant fraction 1%. Tricuspid Valve: Unclear morphology. Moderate regurgitation. Regurgitant fraction 22%. Mitral Valve: Mild regurgitation. Regurgitant fraction 12%. 6.  Normal pericardium.  No pericardial effusion. 7. Small bilateral pleural effusions. Bilateral patchy ground-glass opacification of the aerated lung fields, most prominent on the right side. Recommended dedicated study if concerned for non-cardiac pathology. IMPRESSION: 1. Study is less consistent with cardiac amyloidosis or infiltrative disease. Study is more consistent with inferolateral territory RCA or LCX disease (with ECV elevation but without notable scar). Though septum is 15 mm, there are no other class features consistent with hypertrophic cardiomyopathy. 2.  Low normal LVEF (52%) with mild LV dilation. 3.  Right ventricular dilation with normalization of function. 4. Severe right atrial dilation. Mild pulmonary artery dilation. Moderate tricuspid regurgitation. Riley Lam MD Electronically Signed   By: Riley Lam M.D.   On: 07/07/2023 16:18   MR CARDIAC VELOCITY FLOW MAP Result Date: 07/07/2023 CLINICAL DATA:  Clinical question of cardiac amyloidosis. EXAM: CARDIAC MRI TECHNIQUE: The patient was scanned on a 1.5 Tesla GE magnet. A dedicated cardiac coil was used. Functional imaging was done using Fiesta sequences. 2,3, and 4 chamber views were done to assess for RWMA's. Modified Simpson's rule using a short axis stack was used to calculate an ejection fraction on a dedicated work [employer] officer, [employer]. The patient received 10 cc of Gadavist.  After 10 minutes inversion recovery sequences were used to assess for infiltration and scar tissue. Flow quantification was performed 2 times during this examination with flow quantification performed at the levels of the ascending aorta above the valve, pulmonary artery above the valve. CONTRAST:  10 cc  of Gadavist FINDINGS: 1. Mild left ventricular dilation, with LVEDD 52 mm, but LVEDVi 91 mL/m2. Severe septal hypertrophy 15 mm. No systolic anterior motion of the mitral valve. Papillary muscle hypertrophy. Low normal left ventricular systolic function (LVEF =52%). Basal and mid inferolateral hypokinesis and thinning of myocardium (without notable scar). Left ventricular parametric mapping notable for normal T2. ECV elevated, severely (60%) in the mid- inferolateral wall at worst. There is no late gadolinium enhancement in the left ventricular myocardium. Six standard deviation used. 2.  Dilated right ventricle, with RVEDVI 95 mL/m2. Normal right ventricular thickness. Normal right ventricular systolic function (RVEF =53%). 3. Mild left atrial dilation. Severe right atrial dilation, maximal right atrial indexed volume 79.85 ml/m2. 4. Normal size of the aortic root, ascending aorta. Mild main pulmonary artery dilation, 28 mm. 5. Valve assessment: Aortic Valve: Tri-leaflet aortic valve. Qualitatively there is no significant regurgitation. Regurgitant fraction 2%. Gradient 2 mm Hg. Pulmonic Valve: Qualitatively, there is no significant regurgitation. Regurgitant fraction 1%. Tricuspid Valve: Unclear morphology. Moderate regurgitation. Regurgitant fraction 22%. Mitral Valve: Mild regurgitation. Regurgitant fraction 12%. 6.  Normal pericardium.  No pericardial effusion. 7. Small bilateral pleural effusions. Bilateral patchy ground-glass opacification of the aerated lung fields, most prominent on the right side. Recommended dedicated study if concerned for non-cardiac pathology. IMPRESSION: 1. Study is less consistent  with cardiac amyloidosis or infiltrative disease. Study is more consistent with inferolateral territory RCA or LCX disease (with ECV elevation but without notable scar). Though septum is 15 mm, there are no other class features consistent with hypertrophic cardiomyopathy. 2.  Low normal LVEF (52%) with mild LV dilation. 3.  Right ventricular dilation with normalization of function. 4. Severe right atrial dilation. Mild pulmonary artery dilation. Moderate tricuspid regurgitation. Riley Lam MD Electronically Signed   By: Lafayette Dragon  Izora Ribas M.D.   On: 07/07/2023 16:18   DG CHEST PORT 1 VIEW Result Date: 07/07/2023 CLINICAL DATA:  Chest tube in place. EXAM: PORTABLE CHEST 1 VIEW COMPARISON:  AP chest 07/06/2023, chest two views 07/03/2023 FINDINGS: Left subclavian central venous catheter tip again overlies the central superior vena cava. Left chest wall cardiac loop recorder again noted. There are again bibasilar pigtail drainage catheters, first seen on 07/06/2023 comparison. There is again heterogeneous airspace opacification within the right upper lung, with possible slightly improved aeration of this region. Mild-to-moderate bilateral interstitial thickening is similar to prior. Similar to prior. Mild bilateral pleural effusions are similar to 07/06/2023 and again decreased from 07/03/2023. Cardiac silhouette is again mildly enlarged. Mediastinal contours are within normal limits. Moderate calcification within the aortic arch. No acute skeletal abnormality. IMPRESSION: 1. Bibasilar pigtail drainage catheters. 2. Heterogeneous airspace opacification within the right upper lung is similar to prior, with possible slightly improved aeration of this region. 3. Mild bilateral pleural effusions are similar to 07/06/2023 and again decreased from 07/03/2023. Electronically Signed   By: Neita Garnet M.D.   On: 07/07/2023 14:23   DG CHEST PORT 1 VIEW Result Date: 07/06/2023 CLINICAL DATA:  Chest tube  placement EXAM: PORTABLE CHEST 1 VIEW COMPARISON:  07/03/2023 FINDINGS: Bibasilar chest tubes have been placed. No pneumothorax. Consolidation in the right upper lobe again noted, unchanged. Improved aeration at the left lung base. No confluent opacity on the left. Heart is borderline in size. Aortic atherosclerosis. Left central line tip in the SVC, unchanged. IMPRESSION: Interval placement of bilateral lower lobe chest tubes without pneumothorax. Improved aeration at the left base. Continued consolidation in the right upper lobe. Electronically Signed   By: Charlett Nose M.D.   On: 07/06/2023 19:29   CT Angio Abd/Pel w/ and/or w/o Result Date: 07/06/2023 CLINICAL DATA:  Lower GI bleed. EXAM: CTA ABDOMEN AND PELVIS WITHOUT AND WITH CONTRAST TECHNIQUE: Multidetector CT imaging of the abdomen and pelvis was performed using the standard protocol during bolus administration of intravenous contrast. Multiplanar reconstructed images and MIPs were obtained and reviewed to evaluate the vascular anatomy. RADIATION DOSE REDUCTION: This exam was performed according to the departmental dose-optimization program which includes automated exposure control, adjustment of the mA and/or kV according to patient size and/or use of iterative reconstruction technique. CONTRAST:  75mL OMNIPAQUE IOHEXOL 350 MG/ML SOLN COMPARISON:  Renal ultrasound 07/04/2023. FINDINGS: Technical note: Despite efforts by the technologist and patient, mild motion artifact is present on today's exam and could not be eliminated. This reduces exam sensitivity and specificity. VASCULAR Aorta: Atherosclerosis without evidence of aneurysm or dissection. Celiac: Atherosclerosis without evidence of aneurysm or significant stenosis. SMA: Atherosclerosis without evidence of aneurysm or significant stenosis. Renals: Atherosclerosis at the origin of the right renal artery without evidence of significant stenosis. The left renal artery is markedly diminutive, likely  chronic. IMA: Appears patent. Inflow: Atherosclerosis without evidence of aneurysm or large vessel occlusion. Proximal Outflow: Atherosclerosis without evidence of aneurysm or large vessel occlusion. Veins: No significant venous abnormalities. IVC filter in place. The portal, superior mesenteric and splenic veins are patent. Review of the MIP images confirms the above findings. NON-VASCULAR Lower chest: Moderate-sized bilateral pleural effusions, larger on the right. Associated compressive atelectasis at both lung bases. There is additional patchy ground-glass opacity within the aerated portions of both lungs. Hepatobiliary: The liver appears unremarkable, without suspicious focal lesion or abnormal enhancement. High density material within the gallbladder lumen may reflect sludge or vicarious excretion of contrast. No  apparent gallstones, gallbladder wall thickening or significant biliary dilatation. Pancreas: Unremarkable. No evidence of pancreatic mass, ductal dilatation or surrounding inflammation. Spleen: Normal in size without focal abnormality. Adrenals/Urinary Tract: Both adrenal glands appear unremarkable. Marked left renal cortical thinning without significant contrast enhancement or excretion. No significant abnormality of the right kidney demonstrated. The urinary bladder is decompressed by a Foley catheter. Possible bladder wall thickening. Stomach/Bowel: Pre contrast images demonstrate high density stool within the colon, especially proximally. Post-contrast, no definite active GI bleeding identified. Possible mild sigmoid colon and rectal wall thickening. Lymphatic: No enlarged abdominopelvic lymph nodes identified. Reproductive: The prostate gland and seminal vesicles appear unremarkable. Other: Generalized subcutaneous and mesenteric edema. There is asymmetric enlargement of the right iliacus muscle with heterogeneous low-density components. No high density component, active bleeding or abnormal  enhancement identified. Trace ascites. No pneumoperitoneum or focal fluid collection identified. Musculoskeletal: Nondisplaced fracture of the right 7th rib anteriorly (image 14/9). No other acute osseous findings. Multilevel spondylosis. The sacroiliac joints appear unremarkable. IMPRESSION: 1. No evidence of active GI bleeding. Possible mild sigmoid colon and rectal wall thickening which may reflect proctocolitis. 2. Asymmetric enlargement of the right iliacus muscle with heterogeneous low-density components, likely subacute hematoma or myositis with bursitis. Correlate clinically. No active bleeding or abnormal enhancement identified. 3. Nondisplaced fracture of the right 7th rib anteriorly. 4. Moderate-sized bilateral pleural effusions with associated compressive atelectasis at both lung bases. Additional patchy ground-glass opacity within the aerated portions of both lungs, possibly edema or infection. Generalized soft tissue edema, implying anasarca. 5. Markedly diminutive left renal artery with associated marked left renal cortical thinning, likely chronic. 6. Possible bladder wall thickening, decompressed by a Foley catheter. Correlate clinically for cystitis. 7.  Aortic Atherosclerosis (ICD10-I70.0). Electronically Signed   By: Carey Bullocks M.D.   On: 07/06/2023 14:48   CARDIAC CATHETERIZATION Result Date: 07/05/2023   1st Mrg lesion is 70% stenosed.   Prox LAD to Mid LAD lesion is 50% stenosed.   Dist LAD lesion is 60% stenosed.   Prox RCA lesion is 100% stenosed. Findings: Ao = 125/65 (91) LV = 140/10 RA = 9 RV = 24/16 PA = 40/31 (30) PCW = 14 Fick cardiac output/index = 7.4/3.5 Thermo CO/CI = 7.3/3.5 PVR = 2.2 WU Ao sat = 96% PA sat = 65% PAPi = 1.0 Assessment: 1. 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 Mild PAH with severe RV failure though cardiac output is preserved Plan/Discussion: Medical therapy Arvilla Meres, MD 5:06 PM  [place] EKG SITE  RITE Result Date: 07/05/2023 If Site Rite image not attached, placement could not be confirmed due to current cardiac rhythm.  US RENAL Result Date: 07/04/2023 CLINICAL DATA:  Acute kidney injury EXAM: RENAL / URINARY TRACT ULTRASOUND COMPLETE COMPARISON:  04/13/2018 FINDINGS: Right Kidney: Renal measurements: 12.0 x 5.4 x 6.0 cm = volume: 206 mL. Echogenicity within normal limits. No mass or hydronephrosis visualized. Left Kidney: Renal measurements: Not visualized. Bladder: Foley catheter in place, decompressed. Other: None. IMPRESSION: No acute findings within the right kidney.  No hydronephrosis. Left kidney not visualized. Electronically Signed   By: Charlett Nose M.D.   On: 07/04/2023 20:22   VAS [place] UPPER EXTREMITY VENOUS DUPLEX Result Date: 07/04/2023 UPPER VENOUS STUDY  Patient Name:  Qusai Conkright  Date of Exam:   07/04/2023 Medical Rec #: [id number]   Accession #:    [id number] Date of Birth: 01/09/1951   Patient Gender: M Patient Age:  72 years Exam Location:  [facility] Procedure:      VAS [place] UPPER EXTREMITY VENOUS DUPLEX Referring Phys: ALMA DIAZ --------------------------------------------------------------------------------  Indications: Rule out DVT, Other Indications: H/O DVT in the lower extremities. Comparison Study: No prior exam. Performing Technologist: Fernande Bras  Examination Guidelines: A complete evaluation includes B-mode imaging, spectral Doppler, color Doppler, and power Doppler as needed of all accessible portions of each vessel. Bilateral testing is considered an integral part of a complete examination. Limited examinations for reoccurring indications may be performed as noted.  Right Findings: +----------+------------+---------+-----------+----------+-----------------+ RIGHT     CompressiblePhasicitySpontaneousProperties     Summary      +----------+------------+---------+-----------+----------+-----------------+ IJV           Full       Yes       Yes                                 +----------+------------+---------+-----------+----------+-----------------+ Subclavian    Full       Yes       Yes                                +----------+------------+---------+-----------+----------+-----------------+ Axillary      Full       Yes       Yes                                +----------+------------+---------+-----------+----------+-----------------+ Brachial    Partial      No        No                                 +----------+------------+---------+-----------+----------+-----------------+ Radial                                               Not visualized   +----------+------------+---------+-----------+----------+-----------------+ Ulnar         Full       Yes       Yes                                +----------+------------+---------+-----------+----------+-----------------+ Cephalic      None       No        No                     Acute       +----------+------------+---------+-----------+----------+-----------------+ Basilic       None       No        No               Age Indeterminate +----------+------------+---------+-----------+----------+-----------------+ Thrombus noted in only one the distal left brachial veins.  Left Findings: +----------+------------+---------+-----------+----------+-----------------+ LEFT      CompressiblePhasicitySpontaneousProperties     Summary      +----------+------------+---------+-----------+----------+-----------------+ IJV           Full       Yes       Yes                                +----------+------------+---------+-----------+----------+-----------------+  Subclavian                                           Not visualized   +----------+------------+---------+-----------+----------+-----------------+ Axillary      Full       Yes       Yes                                 +----------+------------+---------+-----------+----------+-----------------+ Brachial      Full       Yes       Yes                                +----------+------------+---------+-----------+----------+-----------------+ Radial        Full                                                    +----------+------------+---------+-----------+----------+-----------------+ Ulnar         Full                                                    +----------+------------+---------+-----------+----------+-----------------+ Cephalic      None       No        No               Age Indeterminate +----------+------------+---------+-----------+----------+-----------------+ Basilic       None       No        No                     Acute       +----------+------------+---------+-----------+----------+-----------------+  Summary:  Right: Findings consistent with acute deep vein thrombosis involving the right brachial veins. Findings consistent with acute superficial vein thrombosis involving the right cephalic vein. Findings consistent with age indeterminate superficial vein thrombosis involving the right basilic vein.  Left: No evidence of deep vein thrombosis in the upper extremity. Findings consistent with acute superficial vein thrombosis involving the left basilic vein. Findings consistent with age indeterminate superficial vein thrombosis involving the left cephalic vein.  *See table(s) above for measurements and observations.  Diagnosing physician: Lemar Livings MD Electronically signed by Lemar Livings MD on 07/04/2023 at 3:44:47 PM.    Final    ECHOCARDIOGRAM COMPLETE Result Date: 07/04/2023    ECHOCARDIOGRAM REPORT   Patient Name:   [facility] Branden Date of Exam: 06/27/2023 Medical Rec #:  [id number]  Height:       73.0 in Accession #:    [id number] Weight:       177.0 lb Date of Birth:  Apr 02, 1951  BSA:          2.043 m Patient Age:    72 years   BP:           115/70 mmHg Patient Gender: M           HR:           64 bpm. Exam Location:  Inpatient  Procedure: 2D Echo, Cardiac Doppler, Color Doppler and Intracardiac            Opacification Agent (Both Spectral and Color Flow Doppler were            utilized during procedure). Indications:     Cardiac Arrest I46.9  History:         Patient has prior history of Echocardiogram examinations, most                  recent 03/10/2023. Previous Myocardial Infarction, TIA; Risk                  Factors:Hypertension.  Sonographer:     Harriette Bouillon RDCS Referring Phys:  [id number] NAVEED [facility] Diagnosing Phys: Arvilla Meres MD IMPRESSIONS  1. Left ventricular ejection fraction, by estimation, is 55%. The left ventricle has normal function. The left ventricle has no regional wall motion abnormalities. There is severe concentric left ventricular hypertrophy. Left ventricular diastolic parameters are consistent with Grade II diastolic dysfunction (pseudonormalization). There is the interventricular septum is flattened in systole and diastole, consistent with right ventricular pressure and volume overload.  2. Right ventricular systolic function is severely reduced. The right ventricular size is normal. Tricuspid regurgitation signal is inadequate for assessing PA pressure.  3. Right atrial size was mildly dilated.  4. A small pericardial effusion is present. The pericardial effusion is surrounding the apex.  5. The mitral valve is normal in structure. Trivial mitral valve regurgitation. No evidence of mitral stenosis.  6. The aortic valve is tricuspid. There is mild calcification of the aortic valve. Aortic valve regurgitation is not visualized. No aortic stenosis is present.  7. The inferior vena cava is dilated in size with <50% respiratory variability, suggesting right atrial pressure of 15 mmHg. Conclusion(s)/Recommendation(s): There is severe LVH and the LV cavity seems underfilled. There is moderate to severe RV dysfunction with flattening of the interventricular septum  suggest or R-sided pressure volume overload. FINDINGS  Left Ventricle: Left ventricular ejection fraction, by estimation, is 55%. The left ventricle has normal function. The left ventricle has no regional wall motion abnormalities. Definity contrast agent was given IV to delineate the left ventricular endocardial borders. The left ventricular internal cavity size was normal in size. There is severe concentric left ventricular hypertrophy. The interventricular septum is flattened in systole and diastole, consistent with right ventricular pressure and volume overload. Left ventricular diastolic parameters are consistent with Grade II diastolic dysfunction (pseudonormalization). Right Ventricle: The right ventricular size is normal. No increase in right ventricular wall thickness. Right ventricular systolic function is severely reduced. Tricuspid regurgitation signal is inadequate for assessing PA pressure. Left Atrium: Left atrial size was normal in size. Right Atrium: Right atrial size was mildly dilated. Pericardium: A small pericardial effusion is present. The pericardial effusion is surrounding the apex. Mitral Valve: The mitral valve is normal in structure. Trivial mitral valve regurgitation. No evidence of mitral valve stenosis. Tricuspid Valve: The tricuspid valve is normal in structure. Tricuspid valve regurgitation is trivial. No evidence of tricuspid stenosis. Aortic Valve: The aortic valve is tricuspid. There is mild calcification of the aortic valve. Aortic valve regurgitation is not visualized. No aortic stenosis is present. Pulmonic Valve: The pulmonic valve was normal in structure. Pulmonic valve regurgitation is not visualized. No evidence of pulmonic stenosis. Aorta: The aortic root is normal in size and structure. Venous: The inferior vena cava is dilated in size with less than 50% respiratory variability, suggesting right atrial pressure of  15 mmHg. IAS/Shunts: No atrial level shunt detected by  color flow Doppler.  LEFT VENTRICLE PLAX 2D LVIDd:         3.40 cm     Diastology LVIDs:         2.00 cm     LV e' medial:    3.59 cm/s LV PW:         1.80 cm     LV E/e' medial:  19.1 LV IVS:        1.90 cm     LV e' lateral:   5.66 cm/s LVOT diam:     2.30 cm     LV E/e' lateral: 12.1 LV SV:         41 LV SV Index:   20 LVOT Area:     4.15 cm  LV Volumes (MOD) LV vol d, MOD A2C: 71.1 ml LV vol d, MOD A4C: 78.0 ml LV vol s, MOD A2C: 43.1 ml LV vol s, MOD A4C: 48.0 ml LV SV MOD A2C:     28.0 ml LV SV MOD A4C:     78.0 ml LV SV MOD BP:      32.6 ml RIGHT VENTRICLE            IVC RV S prime:     7.07 cm/s  IVC diam: 2.10 cm TAPSE (M-mode): 0.8 cm LEFT ATRIUM             Index        RIGHT ATRIUM           Index LA diam:        3.40 cm 1.66 cm/m   RA Area:     16.00 cm LA Vol (A2C):   59.2 ml 28.98 ml/m  RA Volume:   44.50 ml  21.78 ml/m LA Vol (A4C):   23.5 ml 11.50 ml/m LA Biplane Vol: 40.0 ml 19.58 ml/m  AORTIC VALVE LVOT Vmax:   76.70 cm/s LVOT Vmean:  44.400 cm/s LVOT VTI:    0.098 m  AORTA Ao Root diam: 3.00 cm Ao Asc diam:  3.00 cm MITRAL VALVE MV Area (PHT): 3.53 cm    SHUNTS MV Decel Time: 215 msec    Systemic VTI:  0.10 m MV E velocity: 68.50 cm/s  Systemic Diam: 2.30 cm MV A velocity: 41.50 cm/s MV E/A ratio:  1.65 Arvilla Meres MD Electronically signed by Arvilla Meres MD Signature Date/Time: 06/27/2023/9:52:54 AM    Final (Updated)    DG Chest 2 View Result Date: 07/03/2023 CLINICAL DATA:  Dyspnea EXAM: CHEST - 2 VIEW COMPARISON:  07/02/2023 FINDINGS: Frontal and lateral views of the chest demonstrate persistent areas of consolidation bilaterally, most pronounced within the right upper and left lower lobes. There are small bilateral pleural effusions again noted. No pneumothorax. The cardiac silhouette is stable. Left subclavian central venous catheter tip in the region of the superior vena cava. IMPRESSION: 1. Stable bilateral airspace disease and small bilateral pleural effusions.  Electronically Signed   By: Sharlet Salina M.D.   On: 07/03/2023 16:42   NM Pulmonary Perfusion Result Date: 07/03/2023 CLINICAL DATA:  Deep venous thrombosis, dyspnea, right heart failure EXAM: NUCLEAR MEDICINE PERFUSION LUNG SCAN TECHNIQUE: Perfusion images were obtained in multiple projections after intravenous injection of radiopharmaceutical. Ventilation scans intentionally deferred if perfusion scan and chest x-ray adequate for interpretation. RADIOPHARMACEUTICALS:  4.13 mCi Tc-44m MAA IV COMPARISON:  Chest x-ray 07/03/2023 FINDINGS: Planar images of the chest are obtained in multiple projections. There  is a large multi segmental area of decreased perfusion involving the right upper lobe, equal in size to the corresponding right upper lobe opacity on chest x-ray. Additionally, there is segmental perfusion defect within the medial basilar left lower lobe, equal in size to the opacity on chest x-ray. There are no mismatched perfusion defects in comparison to the x-ray exam. IMPRESSION: 1. Multiple perfusion defects which are matched in size to the corresponding chest x-ray findings, compatible with nondiagnostic (low to intermediate probability) exam based on modified PIOPED II criteria. Electronically Signed   By: Sharlet Salina M.D.   On: 07/03/2023 16:41   IR IVC FILTER PLMT / S&I /IMG GUID/MOD SED Result Date: 07/02/2023 CLINICAL DATA:  Left lower extremity DVT, GI bleed and cardiac arrest. Need for IVC filter due to contraindication to anticoagulation currently. EXAM: 1. ULTRASOUND GUIDANCE FOR VASCULAR ACCESS OF THE RIGHT INTERNAL JUGULAR VEIN. 2. IVC VENOGRAM. 3. PERCUTANEOUS IVC FILTER PLACEMENT. ANESTHESIA/SEDATION: Moderate (conscious) sedation was employed during this procedure. A total of Versed 1.0 mg and Fentanyl 100 mcg was administered intravenously. Moderate Sedation Time: 15 minutes. The patient's level of consciousness and vital signs were monitored continuously by radiology nursing  throughout the procedure under my direct supervision. CONTRAST:  CO2 gas FLUOROSCOPY TIME:  1 minute and 18 seconds.  11.8 mGy. PROCEDURE: The procedure, risks, benefits, and alternatives were explained to the patient. Questions regarding the procedure were encouraged and answered. The patient understands and consents to the procedure. A time-out was performed prior to initiating the procedure. The right neck was prepped with chlorhexidine in a sterile fashion, and a sterile drape was applied covering the operative field. A sterile gown and sterile gloves were used for the procedure. Local anesthesia was provided with 1% Lidocaine. Ultrasound was utilized to confirm patency of the right internal jugular vein. Under ultrasound image was saved and recorded. Under direct ultrasound guidance, a 21 gauge needle was advanced into the right internal jugular vein with ultrasound image documentation performed. After securing access with a micropuncture dilator, a guidewire was advanced into the inferior vena cava. A deployment sheath was advanced over the guidewire. This was utilized to perform IVC venography. The deployment sheath was further positioned in an appropriate location for filter deployment. A Bard Denali IVC filter was then advanced in the sheath. This was then fully deployed in the infrarenal IVC. Final filter position was confirmed with a fluoroscopic spot image. After the procedure the sheath was removed and hemostasis obtained with manual compression. COMPLICATIONS: None. FINDINGS: CO2 was used for IVC venography due to underlying renal insufficiency due to chronic kidney disease. IVC venography demonstrates a normal caliber IVC with no evidence of thrombus. Renal veins are identified bilaterally. The IVC filter was successfully positioned below the level of the renal veins and is appropriately oriented. This IVC filter has both permanent and retrievable indications. IMPRESSION: Placement of percutaneous IVC  filter in infrarenal IVC. IVC venogram shows no evidence of IVC thrombus and normal caliber of the inferior vena cava. This filter does have both permanent and retrievable indications. PLAN: This IVC filter is potentially retrievable. The patient will be assessed for filter retrieval by Interventional Radiology in approximately 8-12 weeks. Further recommendations regarding filter retrieval, continued surveillance or declaration of device permanence, will be made at that time. Electronically Signed   By: Irish Lack M.D.   On: 07/02/2023 13:18   DG Chest Port 1 View Result Date: 07/02/2023 CLINICAL DATA:  Short of breath.  Follow-up exam. EXAM:  PORTABLE CHEST 1 VIEW COMPARISON:  06/29/2023 and older studies. FINDINGS: Cardiac silhouette stable. Bilateral, predominantly central airspace lung opacities, right greater than left, overall without significant change from the prior exam allowing for differences in patient positioning. No new lung abnormalities. Small bilateral pleural effusions. No pneumothorax. Stable left subclavian central venous catheter. IMPRESSION: 1. No significant change from the prior study. 2. Bilateral lung opacities consistent with pulmonary edema. Electronically Signed   By: Amie Portland M.D.   On: 07/02/2023 09:12   ECHO TEE Result Date: 06/29/2023    TRANSESOPHOGEAL ECHO REPORT   Patient Name:   Jebadiah Kukla Date of Exam: 06/29/2023 Medical Rec #:  [id number]  Height:       73.0 in Accession #:    [id number] Weight:       185.4 lb Date of Birth:  [birth year]  BSA:          2.083 m Patient Age:    72 years   BP:           156/59 mmHg Patient Gender: M          HR:           88 bpm. Exam Location:  Inpatient Procedure: Limited Echo, Color Doppler and Transesophageal Echo (Both Spectral            and Color Flow Doppler were utilized during procedure). Indications:    Cardioversion  History:        Patient has prior history of Echocardiogram examinations, most                 recent  06/27/2023.  Sonographer:    Harriette Bouillon RDCS Referring Phys: [id number] [place] LEE PROCEDURE: The transesophogeal probe was passed without difficulty through the esophogus of the patient. Sedation performed by different physician. The patient developed no complications during the procedure.  IMPRESSIONS  1. Left ventricular ejection fraction, by estimation, is 55%. The left ventricle has normal function. The left ventricle has no regional wall motion abnormalities. There is severe concentric left ventricular hypertrophy.  2. Right ventricular systolic function is mildly reduced. The right ventricular size is normal.  3. Left atrial size was mildly dilated. No left atrial/left atrial appendage thrombus was detected.  4. Right atrial size was mildly dilated.  5. No PFO or ASD by color doppler.  6. The mitral valve is normal in structure. Trivial mitral valve regurgitation. No evidence of mitral stenosis.  7. The aortic valve is tricuspid. Aortic valve regurgitation is not visualized. No aortic stenosis is present.  8. The inferior vena cava is normal in size with greater than 50% respiratory variability, suggesting right atrial pressure of 3 mmHg. FINDINGS  Left Ventricle: Left ventricular ejection fraction, by estimation, is 55%. The left ventricle has normal function. The left ventricle has no regional wall motion abnormalities. The left ventricular internal cavity size was normal in size. There is severe concentric left ventricular hypertrophy. Right Ventricle: The right ventricular size is normal. No increase in right ventricular wall thickness. Right ventricular systolic function is mildly reduced. Left Atrium: Left atrial size was mildly dilated. No left atrial/left atrial appendage thrombus was detected. Right Atrium: Right atrial size was mildly dilated. Pericardium: There is no evidence of pericardial effusion. Mitral Valve: The mitral valve is normal in structure. Trivial mitral valve regurgitation. No  evidence of mitral valve stenosis. Tricuspid Valve: The tricuspid valve is normal in structure. Tricuspid valve regurgitation is trivial. Aortic Valve: The aortic valve is tricuspid. Aortic  valve regurgitation is not visualized. No aortic stenosis is present. Pulmonic Valve: The pulmonic valve was normal in structure. Pulmonic valve regurgitation is not visualized. Aorta: The aortic root is normal in size and structure. Venous: The inferior vena cava is normal in size with greater than 50% respiratory variability, suggesting right atrial pressure of 3 mmHg. IAS/Shunts: No PFO or ASD by color doppler.  IVC IVC diam: 1.30 cm Dalton McleanMD Electronically signed by Wilfred Lacy Signature Date/Time: 06/29/2023/8:33:40 PM    Final    DG CHEST PORT 1 VIEW Result Date: 06/29/2023 CLINICAL DATA:  Congestive heart failure EXAM: PORTABLE CHEST 1 VIEW COMPARISON:  06/28/2023 FINDINGS: Hazy opacification of the bilateral chest with small, increased pleural effusions. Interval tracheal and esophageal extubation. Normal heart size. Left subclavian line with tip at the upper cavoatrial junction. Implantable loop recorder over the left chest. IMPRESSION: Extubation with lower lung volumes and increased edema/opacification. Small pleural effusions are also progressed. Electronically Signed   By: Tiburcio Pea M.D.   On: 06/29/2023 08:42   DG CHEST PORT 1 VIEW Result Date: 06/28/2023 CLINICAL DATA:  Pneumonia EXAM: PORTABLE CHEST 1 VIEW COMPARISON:  06/26/2023 FINDINGS: Cardiac shadow is stable. Left subclavian central line, endotracheal tube and gastric catheter are noted. The gastric catheter shows the proximal side port at the GE junction. This could be advanced deeper into the stomach. Persistent airspace opacity is noted bilaterally but improved from the prior study. No new focal abnormality is noted. IMPRESSION: Improved aeration when compared with the prior exam. Electronically Signed   By: Alcide Clever M.D.   On:  06/28/2023 11:09   VAS [place] LOWER EXTREMITY VENOUS (DVT) Result Date: 06/27/2023  Lower Venous DVT Study Patient Name:  Sadie Winnett  Date of Exam:   06/27/2023 Medical Rec #: [id number]   Accession #:    [id number] Date of Birth: [birth year]   Patient Gender: M Patient Age:   60 years Exam Location:  [facility] Procedure:      VAS [place] LOWER EXTREMITY VENOUS (DVT) Referring Phys: Levon Hedger --------------------------------------------------------------------------------  Indications: Edema, SOB, and Cardiac arrest, recent fall/injury, CHF, A-fib.  Limitations: Patient extremely still limited popliteal and lower calf views. Comparison Study: Previous study on 8.27.2019 Performing Technologist: Fernande Bras  Examination Guidelines: A complete evaluation includes B-mode imaging, spectral Doppler, color Doppler, and power Doppler as needed of all accessible portions of each vessel. Bilateral testing is considered an integral part of a complete examination. Limited examinations for reoccurring indications may be performed as noted. The reflux portion of the exam is performed with the patient in reverse Trendelenburg.  +---------+---------------+---------+-----------+----------+-------------------+ RIGHT    CompressibilityPhasicitySpontaneityPropertiesThrombus Aging      +---------+---------------+---------+-----------+----------+-------------------+ CFV      Full           Yes      Yes                                      +---------+---------------+---------+-----------+----------+-------------------+ SFJ      Full           Yes      Yes                                      +---------+---------------+---------+-----------+----------+-------------------+ FV Prox  Full                                                             +---------+---------------+---------+-----------+----------+-------------------+  FV Mid   Full                                                              +---------+---------------+---------+-----------+----------+-------------------+ FV DistalFull                                                             +---------+---------------+---------+-----------+----------+-------------------+ PFV      Full                                                             +---------+---------------+---------+-----------+----------+-------------------+ POP      Partial        Yes      Yes                  Chronic             +---------+---------------+---------+-----------+----------+-------------------+ PTV                                                   Not well                                                                  visualized, patent                                                        by color.           +---------+---------------+---------+-----------+----------+-------------------+ PERO                                                  Not well                                                                  visualized, patent  by color.           +---------+---------------+---------+-----------+----------+-------------------+   +---------+---------------+---------+-----------+----------+-------------------+ LEFT     CompressibilityPhasicitySpontaneityPropertiesThrombus Aging      +---------+---------------+---------+-----------+----------+-------------------+ CFV      Full           Yes      Yes                  Intimal thickening. +---------+---------------+---------+-----------+----------+-------------------+ SFJ      Full           Yes      Yes                                      +---------+---------------+---------+-----------+----------+-------------------+ FV Prox  Partial        Yes      Yes                                      +---------+---------------+---------+-----------+----------+-------------------+  FV Mid   Partial        No       No                                       +---------+---------------+---------+-----------+----------+-------------------+ FV DistalPartial        Yes      Yes                                      +---------+---------------+---------+-----------+----------+-------------------+ PFV      Partial        Yes      Yes                                      +---------+---------------+---------+-----------+----------+-------------------+ POP      Partial        Yes      Yes                                      +---------+---------------+---------+-----------+----------+-------------------+ PTV                                                   Not well visualized +---------+---------------+---------+-----------+----------+-------------------+ PERO                                                  Not well visualized +---------+---------------+---------+-----------+----------+-------------------+ EIV                     Yes      Yes                                      +---------+---------------+---------+-----------+----------+-------------------+ Only one  of the paired mid femoral veins is thrombosed.    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  - No cystic structure found in the popliteal fossa.  LEFT: - Findings consistent with acute deep vein thrombosis involving the left proximal profunda vein, and left proximal femoral vein.  - Findings consistent with age indeterminate deep vein thrombosis involving the left popliteal vein, and left middle and distal femoral vein.  - No cystic structure found in the popliteal fossa.  *See table(s) above for measurements and observations. Electronically signed by Lemar Livings MD on 06/27/2023 at 4:57:20 PM.    Final    CT HEAD WO CONTRAST ( ) Result Date: 06/27/2023 CLINICAL DATA:  Mental status change with unknown cause. Cardiac arrest  EXAM: CT HEAD WITHOUT CONTRAST TECHNIQUE: Contiguous axial images were obtained from the base of the skull through the vertex without intravenous contrast. RADIATION DOSE REDUCTION: This exam was performed according to the departmental dose-optimization program which includes automated exposure control, adjustment of the mA and/or kV according to patient size and/or use of iterative reconstruction technique. COMPARISON:  02/16/2022 FINDINGS: Brain: No evidence of acute infarction, hemorrhage, hydrocephalus, extra-axial collection or mass lesion/mass effect. Streak artifact from support hardware is a limiting factor. No evidence of global anoxic injury. Vascular: Calcification at the lower right sylvian fissure is unchanged. No hyperdense vessel. Skull: Negative Sinuses/Orbits: Negative IMPRESSION: Stable from 2023.  No acute finding. Electronically Signed   By: Tiburcio Pea M.D.   On: 06/27/2023 05:03   DG Chest Portable 1 View Result Date: 06/26/2023 CLINICAL DATA:  Central venous catheter placement EXAM: PORTABLE CHEST 1 VIEW COMPARISON:  8:26 p.m. FINDINGS: Left subclavian central venous catheter has been placed with its tip overlying the expected superior vena cava. Endotracheal tube is unchanged tip 4.5 cm above the carina. Nasogastric tube extends into the upper abdomen beyond the margin of the examination. Pulmonary insufflation is stable the lungs are well expanded. Small bilateral pleural effusions are suspected on this supine examination accounting for opacity at the lung apices bilaterally. No definite pneumothorax on this supine examination. Extensive diffuse airspace infiltrate with no avulsed caring of the costophrenic angles is again seen most in keeping with noncardiogenic pulmonary edema though extensive pneumonic infiltrate, diffuse pulmonary hemorrhage, or conditions such as alveolar proteinosis could appear similarly. Cardiac size within normal limits. Implanted loop recorder again noted.  IMPRESSION: 1. Interval placement of left subclavian central venous catheter with its tip overlying the expected superior vena cava. No definite pneumothorax on this supine examination. 2. Stable extensive diffuse airspace infiltrate most in keeping with noncardiogenic pulmonary edema though extensive pneumonic infiltrate, diffuse pulmonary hemorrhage, or conditions such as alveolar proteinosis could appear similarly. Electronically Signed   By: Helyn Numbers M.D.   On: 06/26/2023 23:31   DG Abdomen 1 View Result Date: 06/26/2023 CLINICAL DATA:  Orogastric tube placement EXAM: ABDOMEN - 1 VIEW COMPARISON:  None Available. FINDINGS: Orogastric tube tip overlies the mid body of the stomach. Nonobstructive bowel gas pattern. No free intraperitoneal gas. Bilateral pleural effusions and extensive diffuse airspace infiltrate again noted within the visualized lungs. IMPRESSION: 1. Orogastric tube tip within the mid body of the stomach. Electronically Signed   By: Helyn Numbers M.D.   On: 06/26/2023 23:27   DG Chest Portable 1 View Result Date: 06/26/2023 CLINICAL DATA:  Status post cpr.  Intubation EXAM: PORTABLE CHEST 1 VIEW COMPARISON:  CT angio neck 02/16/2022, chest x-ray 03/09/2023, chest x-ray  04/11/2018 FINDINGS: Endotracheal tube with tip terminating 4.5 cm above the carina. Cardiac paddles overlie the chest. The heart and mediastinal contours are unchanged. Atherosclerotic plaque. Diffuse bat wing perihilar interstitial and airspace opacities. at least trace to small left pleural effusion. Right costophrenic angle collimated off view. Question trace apical right hydropneumothorax with associated calcification along the expected pleural line. No left pneumothorax. No acute osseous abnormality. IMPRESSION: 1. Question trace apical right hydropneumothorax. Recommend attention on follow-up. 2. Findings suggestive of ARDS. Superimposed infection not excluded. 3. At least trace to small left pleural effusion.  Electronically Signed   By: Tish Frederickson M.D.   On: 06/26/2023 22:57      Subjective: States that overall he is doing well.  Reports chronic low back pain even prior to hospital admission but as per PDMP review, was not on chronic opioids and these need to be weaned off quickly.  No chest pain, shortness of breath or dizziness.  Tolerating diet.  Per nursing, no acute issues.  Discharge Exam:  Vitals:   07/20/23 0115 07/20/23 0400 07/20/23 0755 07/20/23 0956  BP: (!) 140/62 138/65 (!) 149/64 (!) 124/55  Pulse: 73 73 73 74  Resp: 18 16 18 17   Temp: 98 F (36.7 C) 98.8 F (37.1 C) 97.7 F (36.5 C)   TempSrc: Oral Oral Oral   SpO2: 99% 98% 97% 97%  Weight:  82.6 kg    Height:        General exam: Elderly male, moderately built and frail, somewhat chronically ill looking sitting up comfortably in bed without distress. Respiratory system: Clear to auscultation.  No increased work of breathing. Cardiovascular system: S1 & S2 heard, RRR. No JVD, murmurs, rubs, gallops or clicks. No pedal edema.  Telemetry personally reviewed: Sinus rhythm.  Has bilateral forearm pitting edema, right more than the left, not much change since yesterday. Gastrointestinal system: Abdomen is nondistended, soft and nontender. No organomegaly or masses felt. Normal bowel sounds heard. Central nervous system: Alert and oriented. No focal neurological deficits. Extremities: Symmetric 5 x 5 power.  Bilateral lower extremity knee-high TED hoses. Skin: No rashes, lesions or ulcers Psychiatry: Judgement and insight appear normal. Mood & affect flat.     The results of significant diagnostics from this hospitalization (including imaging, microbiology, ancillary and laboratory) are listed below for reference.     Microbiology: No results found for this or any previous visit (from the past 240 hours).   Labs: CBC: Recent Labs  Lab 07/16/23 0528 07/17/23 1102 07/18/23 0420 07/19/23 0543 07/20/23 0408   WBC 6.8 8.4 7.1 8.2 9.3  HGB 8.1* 9.1* 8.3* 9.3* 9.4*  HCT 27.2* 30.5* 27.9* 31.3* 31.2*  MCV 82.7 83.6 83.0 83.5 83.4  PLT 289 307 282 293 279    Basic Metabolic Panel: Recent Labs  Lab 07/17/23 0441 07/18/23 0420 07/19/23 0543 07/19/23 1200 07/20/23 0408  NA 138 137 138 136 137  K 4.0 4.1 4.5 4.2 4.5  CL 107 107 110 105 105  CO2 25 24 23 23 25   GLUCOSE 86 95 92 96 104*  BUN 23 25* 23 22 26*  CREATININE 1.85* 2.11* 1.92* 1.94* 1.96*  CALCIUM 8.1* 8.0* 8.1* 8.2* 8.1*  PHOS  --  3.2 3.2  --  3.4    Liver Function Tests: Recent Labs  Lab 07/18/23 0420 07/19/23 0543 07/20/23 0408  ALBUMIN 1.6* 1.6* 1.5*     Urinalysis    Component Value Date/Time   COLORURINE YELLOW 07/04/2023 1141   APPEARANCEUR  HAZY (A) 07/04/2023 1141   LABSPEC 1.010 07/04/2023 1141   PHURINE 5.0 07/04/2023 1141   GLUCOSEU NEGATIVE 07/04/2023 1141   HGBUR NEGATIVE 07/04/2023 1141   BILIRUBINUR NEGATIVE 07/04/2023 1141   KETONESUR NEGATIVE 07/04/2023 1141   PROTEINUR NEGATIVE 07/04/2023 1141   UROBILINOGEN 0.2 08/31/2019 1136   NITRITE NEGATIVE 07/04/2023 1141   LEUKOCYTESUR NEGATIVE 07/04/2023 1141    I called patient's son via phone, updated care and answered all questions.  Time coordinating discharge: 50 minutes  SIGNED:  Marcellus Scott, MD,  FACP, [facility], [facility], [facility]   [facility] [place]     To contact the attending provider between 7A-7P or the covering provider during after hours 7P-7A, please log into the web site [email or web address] and access using universal [place] password for that web site. If you do not have the password, please call the hospital operator.

## 2023-07-20 NOTE — Discharge Instructions (Signed)

## 2023-07-20 NOTE — TOC Transition Note (Addendum)
 Transition of Care [facility]) - Discharge Note   Patient Details  Name: Rodel Glaspy MRN: [id number] Date of Birth: 06-Mar-1951  Transition [facility] CM/SW Contact:  Nicanor Bake Phone Number: [phone] 07/20/2023, 11:53 AM   Clinical Narrative:   [facility] CSW placed the patients PTAR paperwork on the chart.   Patients room number #205 and the number to call for report is [phone]   12:34 PM- PTAR called.     Final next level of care: Skilled Nursing Facility Barriers to Discharge: No Barriers Identified   Patient Goals and CMS Choice Patient states their goals for this hospitalization and ongoing recovery are:: wants to get better CMS Medicare.gov Compare Post Acute Care list provided to:: Patient Choice offered to / list presented to : Patient      Discharge Placement                       Discharge Plan and Services Additional resources added to the After Visit Summary for     Discharge Planning Services: CM Consult Post Acute Care Choice: IP Rehab                               Social Drivers of Health (SDOH) Interventions SDOH Screenings   Food Insecurity: No Food Insecurity (06/27/2023)  Housing: Low Risk  (06/27/2023)  Transportation Needs: No Transportation Needs (06/27/2023)  Utilities: Not At Risk (06/27/2023)  Depression (PHQ2-9): Low Risk  (12/01/2020)  Social Connections: Socially Isolated (06/27/2023)  Tobacco Use: Low Risk  (07/01/2023)     Readmission Risk Interventions    06/27/2023    1:52 PM 10/14/2022   11:29 AM  Readmission Risk Prevention Plan  Post Dischage Appt  Complete  Medication Screening  Complete  Transportation Screening Complete Complete  HRI or Home Care Consult Complete   Social Work Consult for Recovery Care Planning/Counseling Complete   Palliative Care Screening Not Applicable   Medication Review Oceanographer) Referral to Pharmacy

## 2023-07-20 NOTE — Progress Notes (Signed)
 Nursing Discharge Note  Name: Spike Desilets MRN: [id number] DOB: May 15, 1950  Admit Date: 06/26/2023 Discharge Date: 07/20/2023  Alinda Money Coreas to be discharged to a Skilled Nursing Facility per MD order.  Discharging to [facility].  AVS completed, placed in discharge packet for facility review. Discharge packet compiled for facility. Non-emergency ambulance transport arranged. Report called to [facility] LPN  at [facility].     Discharge Instructions      Additional Discharge Instructions   Please get your medications reviewed and adjusted by your Primary MD.  Please request your Primary MD to go over all Hospital Tests and Procedure/Radiological results at the follow up, please get all Hospital records sent to your Primary MD by signing hospital release before you go home.  If you had Pneumonia of Lung problems at the Hospital: Please get a 2 view Chest X ray done in approximately 4 weeks after hospital discharge or sooner if instructed by your Primary MD.  If you have Congestive Heart Failure: Please call your Cardiologist or Primary MD anytime you have any of the following symptoms:  1) 3 pound weight gain in 24 hours or 5 pounds in 1 week  2) shortness of breath, with or without a dry hacking cough  3) swelling in the hands, feet or stomach  4) if you have to sleep on extra pillows at night in order to breathe  Follow cardiac low salt diet and 1.5 lit/day fluid restriction.  If you have diabetes Accuchecks 4 times/day, Once in AM empty stomach and then before each meal. Log in all results and show them to your primary doctor at your next visit. If any glucose reading is under 80 or above 300 call your primary MD immediately.  If you have Seizure/Convulsions/Epilepsy: Please do not drive, operate heavy machinery, participate in activities at heights or participate in high speed sports until you have seen by  Primary MD or a Neurologist and advised to do so again. Per [facility] statutes, patients with seizures are not allowed to drive until they have been seizure-free for six months.  Use caution when using heavy equipment or power tools. Avoid working on ladders or at heights. Take showers instead of baths. Ensure the water temperature is not too high on the home water heater. Do not go swimming alone. Do not lock yourself in a room alone (i.e. bathroom). When caring for infants or small children, sit down when holding, feeding, or changing them to minimize risk of injury to the child in the event you have a seizure. Maintain good sleep hygiene. Avoid alcohol.   If you had Gastrointestinal Bleeding: Please ask your Primary MD to check a complete blood count within one week of discharge or at your next visit. Your endoscopic/colonoscopic biopsies that are pending at the time of discharge, will also need to followed by your Primary MD.  Get Medicines reviewed and adjusted. Please take all your medications with you for your next visit with your Primary MD  Please request your Primary MD to go over all hospital tests and procedure/radiological results at the follow up, please ask your Primary MD to get all Hospital records sent to his/her office.  If you experience worsening of your admission symptoms, develop shortness of breath, life threatening emergency, suicidal or homicidal thoughts you must seek medical attention immediately by calling 911 or calling your MD immediately  if symptoms less severe.  You must read complete instructions/literature along with all  the possible adverse reactions/side effects for all the Medicines you take and that have been prescribed to you. Take any new Medicines after you have completely understood and accpet all the possible adverse reactions/side effects.   Do not drive or operate heavy machinery when taking Pain medications.   Do not take more than prescribed  Pain, Sleep and Anxiety Medications  Special Instructions: If you have smoked or chewed Tobacco  in the last 2 yrs please stop smoking, stop any regular Alcohol  and or any Recreational drug use.  Wear Seat belts while driving.  Please note You were cared for by a hospitalist during your hospital stay. If you have any questions about your discharge medications or the care you received while you were in the hospital after you are discharged, you can call the unit and asked to speak with the hospitalist on call if the hospitalist that took care of you is not available. Once you are discharged, your primary care physician will handle any further medical issues. Please note that NO REFILLS for any discharge medications will be authorized once you are discharged, as it is imperative that you return to your primary care physician (or establish a relationship with a primary care physician if you do not have one) for your aftercare needs so that they can reassess your need for medications and monitor your lab values.  You can reach the hospitalist office at phone [phone] or fax [phone]   If you do not have a primary care physician, you can call [phone] for a physician referral.       Allergies as of 07/20/2023       Reactions   Amlodipine Swelling   Excessive swelling and skin blotching    Morphine And Codeine Itching, Other (See Comments)   "Cannot handle it"        Medication List     STOP taking these medications    aspirin EC 81 MG tablet   carvedilol 25 MG tablet Commonly known as: COREG   clopidogrel 75 MG tablet Commonly known as: PLAVIX   fluticasone 50 MCG/ACT nasal spray Commonly known as: FLONASE   furosemide 20 MG tablet Commonly known as: LASIX   furosemide 40 MG tablet Commonly known as: LASIX   hydrALAZINE 50 MG tablet Commonly known as: APRESOLINE   spironolactone 25 MG tablet Commonly known as: ALDACTONE       TAKE these medications     acetaminophen 325 MG tablet Commonly known as: TYLENOL Take 2 tablets (650 mg total) by mouth every 4 (four) hours as needed for headache or mild pain (pain score 1-3).   amiodarone 200 MG tablet Commonly known as: PACERONE Take 1 tablet (200 mg total) by mouth daily. Start taking on: July 21, 2023   apixaban 5 MG Tabs tablet Commonly known as: ELIQUIS Take 1 tablet (5 mg total) by mouth 2 (two) times daily.   atorvastatin 80 MG tablet Commonly known as: LIPITOR Take 1 tablet (80 mg total) by mouth daily. What changed: when to take this   docusate sodium 100 MG capsule Commonly known as: COLACE Take 1 capsule (100 mg total) by mouth 2 (two) times daily as needed for mild constipation.   empagliflozin 10 MG Tabs tablet Commonly known as: JARDIANCE Take 1 tablet (10 mg total) by mouth daily. Start taking on: July 21, 2023   ezetimibe 10 MG tablet Commonly known as: Zetia Take 1 tablet (10 mg total) by mouth daily.   lactose free  nutrition Liqd Take 237 mLs by mouth 2 (two) times daily between meals.   lidocaine 5 % Commonly known as: LIDODERM Place 1 patch onto the skin daily. Remove & Discard patch within 12 hours or as directed by MD. Place on Right Hip. Start taking on: July 21, 2023   multivitamin with minerals Tabs tablet Take 1 tablet by mouth daily. Start taking on: July 21, 2023   oxyCODONE 5 MG immediate release tablet Commonly known as: Oxy IR/ROXICODONE Take 1 tablet (5 mg total) by mouth every 8 (eight) hours as needed for moderate pain (pain score 4-6) or severe pain (pain score 7-10).   pantoprazole 40 MG tablet Commonly known as: PROTONIX Take 1 tablet (40 mg total) by mouth 2 (two) times daily before a meal. What changed: when to take this   polyethylene glycol 17 g packet Commonly known as: MIRALAX / GLYCOLAX Take 17 g by mouth daily as needed for moderate constipation.   sacubitril-valsartan 97-103 MG Commonly known as: ENTRESTO Take 1  tablet by mouth 2 (two) times daily.   sertraline 50 MG tablet Commonly known as: ZOLOFT Take 1 tablet (50 mg total) by mouth at bedtime.   Torsemide 40 MG Tabs Take 40 mg by mouth daily. Start taking on: July 21, 2023         Discharge Instructions     (HEART FAILURE PATIENTS) Call MD:  Anytime you have any of the following symptoms: 1) 3 pound weight gain in 24 hours or 5 pounds in 1 week 2) shortness of breath, with or without a dry hacking cough 3) swelling in the hands, feet or stomach 4) if you have to sleep on extra pillows at night in order to breathe.   Complete by: As directed    Call MD for:  difficulty breathing, headache or visual disturbances   Complete by: As directed    Call MD for:  extreme fatigue   Complete by: As directed    Call MD for:  persistant dizziness or light-headedness   Complete by: As directed    Call MD for:  persistant nausea and vomiting   Complete by: As directed    Call MD for:  severe uncontrolled pain   Complete by: As directed    Call MD for:  temperature >100.4   Complete by: As directed    Diet - low sodium heart healthy   Complete by: As directed    Increase activity slowly   Complete by: As directed    No wound care   Complete by: As directed          [facility] to provide transportation to facility for patient. Non-emergency ambulance transport at bedside. Handoff completed with [facility] staff/EMTs.   Patient discharged from hospital unit via stretcher. Stable at time of discharge.

## 2023-07-20 NOTE — Plan of Care (Signed)
 Problem: Education: Goal: Ability to describe self-care measures that may prevent or decrease complications (Diabetes Survival Skills Education) will improve Outcome: Adequate for Discharge Goal: Individualized Educational Video(s) Outcome: Adequate for Discharge   Problem: Coping: Goal: Ability to adjust to condition or change in health will improve Outcome: Adequate for Discharge   Problem: Fluid Volume: Goal: Ability to maintain a balanced intake and output will improve Outcome: Adequate for Discharge   Problem: Health Behavior/Discharge Planning: Goal: Ability to identify and utilize available resources and services will improve Outcome: Adequate for Discharge Goal: Ability to manage health-related needs will improve Outcome: Adequate for Discharge   Problem: Metabolic: Goal: Ability to maintain appropriate glucose levels will improve Outcome: Adequate for Discharge   Problem: Nutritional: Goal: Maintenance of adequate nutrition will improve Outcome: Adequate for Discharge Goal: Progress toward achieving an optimal weight will improve Outcome: Adequate for Discharge   Problem: Skin Integrity: Goal: Risk for impaired skin integrity will decrease Outcome: Adequate for Discharge   Problem: Tissue Perfusion: Goal: Adequacy of tissue perfusion will improve Outcome: Adequate for Discharge   Problem: Education: Goal: Knowledge of General Education information will improve Description: Including pain rating scale, medication(s)/side effects and non-pharmacologic comfort measures Outcome: Adequate for Discharge   Problem: Health Behavior/Discharge Planning: Goal: Ability to manage health-related needs will improve Outcome: Adequate for Discharge   Problem: Clinical Measurements: Goal: Ability to maintain clinical measurements within normal limits will improve Outcome: Adequate for Discharge Goal: Will remain free from infection Outcome: Adequate for Discharge Goal:  Diagnostic test results will improve Outcome: Adequate for Discharge Goal: Respiratory complications will improve Outcome: Adequate for Discharge Goal: Cardiovascular complication will be avoided Outcome: Adequate for Discharge   Problem: Activity: Goal: Risk for activity intolerance will decrease Outcome: Adequate for Discharge   Problem: Coping: Goal: Level of anxiety will decrease Outcome: Adequate for Discharge   Problem: Elimination: Goal: Will not experience complications related to bowel motility Outcome: Adequate for Discharge   Problem: Pain Managment: Goal: General experience of comfort will improve and/or be controlled Outcome: Adequate for Discharge   Problem: Safety: Goal: Ability to remain free from injury will improve Outcome: Adequate for Discharge   Problem: Skin Integrity: Goal: Risk for impaired skin integrity will decrease Outcome: Adequate for Discharge   Problem: SLP Dysphagia Goals Goal: Patient will utilize recommended strategies Description: Patient will utilize recommended strategies during swallow to increase swallowing safety with Outcome: Adequate for Discharge   Problem: Acute Rehab PT Goals(only PT should resolve) Goal: Pt Will Go Supine/Side To Sit Outcome: Adequate for Discharge Goal: Patient Will Transfer Sit To/From Stand Outcome: Adequate for Discharge Goal: Pt Will Transfer Bed To Chair/Chair To Bed Outcome: Adequate for Discharge Goal: Pt Will Ambulate Outcome: Adequate for Discharge   Problem: Acute Rehab OT Goals (only OT should resolve) Goal: Pt. Will Perform Grooming Outcome: Adequate for Discharge Goal: Pt. Will Perform Upper Body Bathing Outcome: Adequate for Discharge Goal: Pt. Will Perform Upper Body Dressing Outcome: Adequate for Discharge Goal: Pt. Will Perform Lower Body Dressing Outcome: Adequate for Discharge Goal: Pt. Will Transfer To Toilet Outcome: Adequate for Discharge Goal: Pt. Will Perform  Toileting-Clothing Manipulation Outcome: Adequate for Discharge   Problem: Education: Goal: Understanding of CV disease, CV risk reduction, and recovery process will improve Outcome: Adequate for Discharge Goal: Individualized Educational Video(s) Outcome: Adequate for Discharge   Problem: Activity: Goal: Ability to return to baseline activity level will improve Outcome: Adequate for Discharge   Problem: Cardiovascular: Goal: Ability to achieve and  maintain adequate cardiovascular perfusion will improve Outcome: Adequate for Discharge Goal: Vascular access site(s) Level 0-1 will be maintained Outcome: Adequate for Discharge   Problem: Health Behavior/Discharge Planning: Goal: Ability to safely manage health-related needs after discharge will improve Outcome: Adequate for Discharge   Problem: Inadequate Intake (NI-2.1) Goal: Food and/or nutrient delivery Description: Individualized approach for food/nutrient provision. Outcome: Adequate for Discharge

## 2023-07-20 NOTE — Plan of Care (Signed)
  Problem: Education: Goal: Knowledge of General Education information will improve Description: Including pain rating scale, medication(s)/side effects and non-pharmacologic comfort measures Outcome: Progressing   Problem: Health Behavior/Discharge Planning: Goal: Ability to manage health-related needs will improve Outcome: Progressing   Problem: Clinical Measurements: Goal: Cardiovascular complication will be avoided Outcome: Progressing   Problem: Activity: Goal: Risk for activity intolerance will decrease Outcome: Progressing   Problem: Elimination: Goal: Will not experience complications related to bowel motility Outcome: Progressing

## 2023-07-20 NOTE — TOC Progression Note (Signed)
 Transition of Care [facility]) - Progression Note    Patient Details  Name: Billy [name] MRN: [id number] Date of Birth: 1950/06/17  [facility] CM/SW Contact  Elliot Cousin, RN Phone Number: [phone] 07/20/2023, 9:39 AM  Clinical Narrative:    TOC CM offered Outpatient Palliative Services choice to pt (medicare.gov list with ratings placed on chart). Agreeable to [facility] for Outpatient Palliative.    Expected Discharge Plan: IP Rehab Facility Barriers to Discharge: Continued Medical Work up  Expected Discharge Plan and Services   Discharge Planning Services: CM Consult Post Acute Care Choice: IP Rehab Living arrangements for the past 2 months: Single Family Home                                       Social Determinants of Health (SDOH) Interventions SDOH Screenings   Food Insecurity: No Food Insecurity (06/27/2023)  Housing: Low Risk  (06/27/2023)  Transportation Needs: No Transportation Needs (06/27/2023)  Utilities: Not At Risk (06/27/2023)  Depression (PHQ2-9): Low Risk  (12/01/2020)  Social Connections: Socially Isolated (06/27/2023)  Tobacco Use: Low Risk  (07/01/2023)    Readmission Risk Interventions    06/27/2023    1:52 PM 10/14/2022   11:29 AM  Readmission Risk Prevention Plan  Post Dischage Appt  Complete  Medication Screening  Complete  Transportation Screening Complete Complete  HRI or Home Care Consult Complete   Social Work Consult for Recovery Care Planning/Counseling Complete   Palliative Care Screening Not Applicable   Medication Review Oceanographer) Referral to Pharmacy

## 2023-07-20 NOTE — Progress Notes (Signed)
   [facility] Liaison Note:  Notified by [facility] manager of patient/family request for [facility] Palliative services at home after discharge.   Please call with any hospice or outpatient palliative care related questions.   Thank you for the opportunity to participate in this patient's care.   Glenna Fellows, BSN, RN, OCN [employer] [phone]

## 2023-07-21 DIAGNOSIS — N1832 Chronic kidney disease, stage 3b: Secondary | ICD-10-CM | POA: Diagnosis not present

## 2023-07-21 DIAGNOSIS — F32A Depression, unspecified: Secondary | ICD-10-CM | POA: Diagnosis not present

## 2023-07-21 DIAGNOSIS — R570 Cardiogenic shock: Secondary | ICD-10-CM | POA: Diagnosis not present

## 2023-07-21 DIAGNOSIS — I480 Paroxysmal atrial fibrillation: Secondary | ICD-10-CM | POA: Diagnosis not present

## 2023-07-21 DIAGNOSIS — I462 Cardiac arrest due to underlying cardiac condition: Secondary | ICD-10-CM | POA: Diagnosis not present

## 2023-07-21 DIAGNOSIS — I2781 Cor pulmonale (chronic): Secondary | ICD-10-CM | POA: Diagnosis not present

## 2023-07-21 DIAGNOSIS — E1169 Type 2 diabetes mellitus with other specified complication: Secondary | ICD-10-CM | POA: Diagnosis not present

## 2023-07-21 DIAGNOSIS — I5023 Acute on chronic systolic (congestive) heart failure: Secondary | ICD-10-CM | POA: Diagnosis not present

## 2023-07-21 DIAGNOSIS — E785 Hyperlipidemia, unspecified: Secondary | ICD-10-CM | POA: Diagnosis not present

## 2023-07-21 DIAGNOSIS — K219 Gastro-esophageal reflux disease without esophagitis: Secondary | ICD-10-CM | POA: Diagnosis not present

## 2023-07-21 DIAGNOSIS — Z515 Encounter for palliative care: Secondary | ICD-10-CM | POA: Diagnosis not present

## 2023-07-21 DIAGNOSIS — I272 Pulmonary hypertension, unspecified: Secondary | ICD-10-CM | POA: Diagnosis not present

## 2023-07-24 DIAGNOSIS — I82412 Acute embolism and thrombosis of left femoral vein: Secondary | ICD-10-CM | POA: Diagnosis not present

## 2023-07-24 DIAGNOSIS — Z515 Encounter for palliative care: Secondary | ICD-10-CM | POA: Diagnosis not present

## 2023-07-24 DIAGNOSIS — R570 Cardiogenic shock: Secondary | ICD-10-CM | POA: Diagnosis not present

## 2023-07-24 DIAGNOSIS — I480 Paroxysmal atrial fibrillation: Secondary | ICD-10-CM | POA: Diagnosis not present

## 2023-07-24 DIAGNOSIS — K921 Melena: Secondary | ICD-10-CM | POA: Diagnosis not present

## 2023-07-24 DIAGNOSIS — I2781 Cor pulmonale (chronic): Secondary | ICD-10-CM | POA: Diagnosis not present

## 2023-07-24 DIAGNOSIS — I469 Cardiac arrest, cause unspecified: Secondary | ICD-10-CM | POA: Diagnosis not present

## 2023-07-24 DIAGNOSIS — J9601 Acute respiratory failure with hypoxia: Secondary | ICD-10-CM | POA: Diagnosis not present

## 2023-07-24 DIAGNOSIS — I5033 Acute on chronic diastolic (congestive) heart failure: Secondary | ICD-10-CM | POA: Diagnosis not present

## 2023-07-24 DIAGNOSIS — I272 Pulmonary hypertension, unspecified: Secondary | ICD-10-CM | POA: Diagnosis not present

## 2023-07-24 DIAGNOSIS — J90 Pleural effusion, not elsewhere classified: Secondary | ICD-10-CM | POA: Diagnosis not present

## 2023-07-24 DIAGNOSIS — I5023 Acute on chronic systolic (congestive) heart failure: Secondary | ICD-10-CM | POA: Diagnosis not present

## 2023-07-24 DIAGNOSIS — I462 Cardiac arrest due to underlying cardiac condition: Secondary | ICD-10-CM | POA: Diagnosis not present

## 2023-07-25 DIAGNOSIS — I82412 Acute embolism and thrombosis of left femoral vein: Secondary | ICD-10-CM | POA: Diagnosis not present

## 2023-07-25 DIAGNOSIS — G629 Polyneuropathy, unspecified: Secondary | ICD-10-CM | POA: Diagnosis not present

## 2023-07-25 DIAGNOSIS — I272 Pulmonary hypertension, unspecified: Secondary | ICD-10-CM | POA: Diagnosis not present

## 2023-07-25 DIAGNOSIS — I2781 Cor pulmonale (chronic): Secondary | ICD-10-CM | POA: Diagnosis not present

## 2023-07-25 DIAGNOSIS — I469 Cardiac arrest, cause unspecified: Secondary | ICD-10-CM | POA: Diagnosis not present

## 2023-07-25 DIAGNOSIS — J9601 Acute respiratory failure with hypoxia: Secondary | ICD-10-CM | POA: Diagnosis not present

## 2023-07-25 DIAGNOSIS — R570 Cardiogenic shock: Secondary | ICD-10-CM | POA: Diagnosis not present

## 2023-07-25 DIAGNOSIS — K921 Melena: Secondary | ICD-10-CM | POA: Diagnosis not present

## 2023-07-25 DIAGNOSIS — I480 Paroxysmal atrial fibrillation: Secondary | ICD-10-CM | POA: Diagnosis not present

## 2023-07-25 DIAGNOSIS — I5033 Acute on chronic diastolic (congestive) heart failure: Secondary | ICD-10-CM | POA: Diagnosis not present

## 2023-07-25 DIAGNOSIS — J90 Pleural effusion, not elsewhere classified: Secondary | ICD-10-CM | POA: Diagnosis not present

## 2023-07-25 DIAGNOSIS — I462 Cardiac arrest due to underlying cardiac condition: Secondary | ICD-10-CM | POA: Diagnosis not present

## 2023-07-25 DIAGNOSIS — I5023 Acute on chronic systolic (congestive) heart failure: Secondary | ICD-10-CM | POA: Diagnosis not present

## 2023-07-26 ENCOUNTER — Telehealth (HOSPITAL_COMMUNITY): Payer: Self-pay

## 2023-07-26 DIAGNOSIS — I82412 Acute embolism and thrombosis of left femoral vein: Secondary | ICD-10-CM | POA: Diagnosis not present

## 2023-07-26 DIAGNOSIS — I462 Cardiac arrest due to underlying cardiac condition: Secondary | ICD-10-CM | POA: Diagnosis not present

## 2023-07-26 DIAGNOSIS — I480 Paroxysmal atrial fibrillation: Secondary | ICD-10-CM | POA: Diagnosis not present

## 2023-07-26 DIAGNOSIS — R570 Cardiogenic shock: Secondary | ICD-10-CM | POA: Diagnosis not present

## 2023-07-26 DIAGNOSIS — I5033 Acute on chronic diastolic (congestive) heart failure: Secondary | ICD-10-CM | POA: Diagnosis not present

## 2023-07-26 DIAGNOSIS — I272 Pulmonary hypertension, unspecified: Secondary | ICD-10-CM | POA: Diagnosis not present

## 2023-07-26 DIAGNOSIS — J9601 Acute respiratory failure with hypoxia: Secondary | ICD-10-CM | POA: Diagnosis not present

## 2023-07-26 DIAGNOSIS — I469 Cardiac arrest, cause unspecified: Secondary | ICD-10-CM | POA: Diagnosis not present

## 2023-07-26 DIAGNOSIS — J90 Pleural effusion, not elsewhere classified: Secondary | ICD-10-CM | POA: Diagnosis not present

## 2023-07-26 DIAGNOSIS — I5023 Acute on chronic systolic (congestive) heart failure: Secondary | ICD-10-CM | POA: Diagnosis not present

## 2023-07-26 DIAGNOSIS — I2781 Cor pulmonale (chronic): Secondary | ICD-10-CM | POA: Diagnosis not present

## 2023-07-26 DIAGNOSIS — K921 Melena: Secondary | ICD-10-CM | POA: Diagnosis not present

## 2023-07-26 NOTE — Telephone Encounter (Signed)
 Called to confirm/remind patient of their appointment at the [facility] on 07/27/2023 9:00.   Appointment:   [] Confirmed  [x] Left mess   [] No answer/No voice mail  [] Phone not in service  Patient reminded to bring all medications and/or complete list.  Confirmed patient has transportation. Gave directions, instructed to utilize valet parking.

## 2023-07-27 ENCOUNTER — Ambulatory Visit (HOSPITAL_COMMUNITY)
Admit: 2023-07-27 | Discharge: 2023-07-27 | Disposition: A | Discharge status: Home / Self Care | Attending: Adult Health | Admitting: Adult Health

## 2023-07-27 VITALS — BP 95/43 | HR 69 | Ht 73.0 in

## 2023-07-27 DIAGNOSIS — I6521 Occlusion and stenosis of right carotid artery: Secondary | ICD-10-CM | POA: Diagnosis not present

## 2023-07-27 DIAGNOSIS — I480 Paroxysmal atrial fibrillation: Secondary | ICD-10-CM | POA: Insufficient documentation

## 2023-07-27 DIAGNOSIS — I252 Old myocardial infarction: Secondary | ICD-10-CM | POA: Insufficient documentation

## 2023-07-27 DIAGNOSIS — Z79899 Other long term (current) drug therapy: Secondary | ICD-10-CM | POA: Diagnosis not present

## 2023-07-27 DIAGNOSIS — Z7901 Long term (current) use of anticoagulants: Secondary | ICD-10-CM | POA: Diagnosis not present

## 2023-07-27 DIAGNOSIS — Z8673 Personal history of transient ischemic attack (TIA), and cerebral infarction without residual deficits: Secondary | ICD-10-CM | POA: Diagnosis not present

## 2023-07-27 DIAGNOSIS — I5032 Chronic diastolic (congestive) heart failure: Secondary | ICD-10-CM | POA: Insufficient documentation

## 2023-07-27 DIAGNOSIS — I272 Pulmonary hypertension, unspecified: Secondary | ICD-10-CM | POA: Diagnosis not present

## 2023-07-27 DIAGNOSIS — K921 Melena: Secondary | ICD-10-CM | POA: Diagnosis not present

## 2023-07-27 DIAGNOSIS — I5023 Acute on chronic systolic (congestive) heart failure: Secondary | ICD-10-CM | POA: Diagnosis not present

## 2023-07-27 DIAGNOSIS — N1832 Chronic kidney disease, stage 3b: Secondary | ICD-10-CM | POA: Diagnosis not present

## 2023-07-27 DIAGNOSIS — E7800 Pure hypercholesterolemia, unspecified: Secondary | ICD-10-CM | POA: Diagnosis not present

## 2023-07-27 DIAGNOSIS — Z8674 Personal history of sudden cardiac arrest: Secondary | ICD-10-CM | POA: Diagnosis not present

## 2023-07-27 DIAGNOSIS — I469 Cardiac arrest, cause unspecified: Secondary | ICD-10-CM | POA: Diagnosis not present

## 2023-07-27 DIAGNOSIS — I5033 Acute on chronic diastolic (congestive) heart failure: Secondary | ICD-10-CM | POA: Diagnosis not present

## 2023-07-27 DIAGNOSIS — Z8719 Personal history of other diseases of the digestive system: Secondary | ICD-10-CM | POA: Insufficient documentation

## 2023-07-27 DIAGNOSIS — I82412 Acute embolism and thrombosis of left femoral vein: Secondary | ICD-10-CM | POA: Diagnosis not present

## 2023-07-27 DIAGNOSIS — Z7902 Long term (current) use of antithrombotics/antiplatelets: Secondary | ICD-10-CM | POA: Insufficient documentation

## 2023-07-27 DIAGNOSIS — I959 Hypotension, unspecified: Secondary | ICD-10-CM | POA: Diagnosis not present

## 2023-07-27 DIAGNOSIS — I2781 Cor pulmonale (chronic): Secondary | ICD-10-CM | POA: Diagnosis not present

## 2023-07-27 DIAGNOSIS — I2510 Atherosclerotic heart disease of native coronary artery without angina pectoris: Secondary | ICD-10-CM | POA: Insufficient documentation

## 2023-07-27 DIAGNOSIS — I3139 Other pericardial effusion (noninflammatory): Secondary | ICD-10-CM | POA: Insufficient documentation

## 2023-07-27 DIAGNOSIS — I82623 Acute embolism and thrombosis of deep veins of upper extremity, bilateral: Secondary | ICD-10-CM | POA: Diagnosis not present

## 2023-07-27 DIAGNOSIS — D649 Anemia, unspecified: Secondary | ICD-10-CM | POA: Diagnosis not present

## 2023-07-27 DIAGNOSIS — I82403 Acute embolism and thrombosis of unspecified deep veins of lower extremity, bilateral: Secondary | ICD-10-CM | POA: Diagnosis not present

## 2023-07-27 DIAGNOSIS — I130 Hypertensive heart and chronic kidney disease with heart failure and stage 1 through stage 4 chronic kidney disease, or unspecified chronic kidney disease: Secondary | ICD-10-CM | POA: Insufficient documentation

## 2023-07-27 DIAGNOSIS — R570 Cardiogenic shock: Secondary | ICD-10-CM | POA: Diagnosis not present

## 2023-07-27 DIAGNOSIS — J90 Pleural effusion, not elsewhere classified: Secondary | ICD-10-CM | POA: Diagnosis not present

## 2023-07-27 DIAGNOSIS — J9601 Acute respiratory failure with hypoxia: Secondary | ICD-10-CM | POA: Diagnosis not present

## 2023-07-27 DIAGNOSIS — I462 Cardiac arrest due to underlying cardiac condition: Secondary | ICD-10-CM | POA: Diagnosis not present

## 2023-07-27 DIAGNOSIS — Z993 Dependence on wheelchair: Secondary | ICD-10-CM | POA: Diagnosis not present

## 2023-07-27 LAB — CBC
HCT: 33.8 % — ABNORMAL LOW (ref 39.0–52.0)
Hemoglobin: 10.1 g/dL — ABNORMAL LOW (ref 13.0–17.0)
MCH: 26 pg (ref 26.0–34.0)
MCHC: 29.9 g/dL — ABNORMAL LOW (ref 30.0–36.0)
MCV: 87.1 fL (ref 80.0–100.0)
Platelets: 210 10*3/uL (ref 150–400)
RBC: 3.88 MIL/uL — ABNORMAL LOW (ref 4.22–5.81)
RDW: 21.4 % — ABNORMAL HIGH (ref 11.5–15.5)
WBC: 9 10*3/uL (ref 4.0–10.5)
nRBC: 0 % (ref 0.0–0.2)

## 2023-07-27 LAB — BASIC METABOLIC PANEL WITH GFR
Anion gap: 11 (ref 5–15)
BUN: 26 mg/dL — ABNORMAL HIGH (ref 8–23)
CO2: 25 mmol/L (ref 22–32)
Calcium: 8.2 mg/dL — ABNORMAL LOW (ref 8.9–10.3)
Chloride: 106 mmol/L (ref 98–111)
Creatinine, Ser: 2.35 mg/dL — ABNORMAL HIGH (ref 0.61–1.24)
GFR, Estimated: 29 mL/min — ABNORMAL LOW (ref >60)
Glucose, Bld: 105 mg/dL — ABNORMAL HIGH (ref 70–99)
Potassium: 2.9 mmol/L — ABNORMAL LOW (ref 3.5–5.1)
Sodium: 142 mmol/L (ref 135–145)

## 2023-07-27 NOTE — Patient Instructions (Signed)
 Medication Changes:  No Changes In Medications at this time.   Lab Work:  Labs done today, your results will be available in MyChart, we will contact you for abnormal readings.  Follow-Up in: 3 months with Dr. Shirlee Latch PLEASE CALL OUR OFFICE AROUND LATE MAY/EARLY JUNE TO GET SCHEDULED FOR YOUR APPOINTMENT. PHONE NUMBER IS [phone] OPTION 2   At the [facility], you and your health needs are our priority. We have a designated team specialized in the treatment of Heart Failure. This Care Team includes your primary Heart Failure Specialized Cardiologist (physician), Advanced Practice Providers (APPs- Physician Assistants and Nurse Practitioners), and Pharmacist who all work together to provide you with the care you need, when you need it.   You may see any of the following providers on your designated Care Team at your next follow up:  Dr. Arvilla Meres Dr. Marca Ancona Dr. Dorthula Nettles Dr. Theresia Bough Tonye Becket, NP Robbie Lis, [place] [facility] [place], [place] Brynda Peon, NP [place] Lee, NP Karle Plumber, PharmD   Please be sure to bring in all your medications bottles to every appointment.   Need to Contact us:  If you have any questions or concerns before your next appointment please send [place] a message through [place] or call our office at [phone].    TO LEAVE A MESSAGE FOR THE NURSE SELECT OPTION 2, PLEASE LEAVE A MESSAGE INCLUDING: YOUR NAME DATE OF BIRTH CALL BACK NUMBER REASON FOR CALL**this is important as we prioritize the call backs  YOU WILL RECEIVE A CALL BACK THE SAME DAY AS LONG AS YOU CALL BEFORE 4:00 PM
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
## 2023-07-28 ENCOUNTER — Telehealth (HOSPITAL_COMMUNITY): Payer: Self-pay

## 2023-07-28 DIAGNOSIS — J90 Pleural effusion, not elsewhere classified: Secondary | ICD-10-CM | POA: Diagnosis not present

## 2023-07-28 DIAGNOSIS — I462 Cardiac arrest due to underlying cardiac condition: Secondary | ICD-10-CM | POA: Diagnosis not present

## 2023-07-28 DIAGNOSIS — I5033 Acute on chronic diastolic (congestive) heart failure: Secondary | ICD-10-CM | POA: Diagnosis not present

## 2023-07-28 DIAGNOSIS — R570 Cardiogenic shock: Secondary | ICD-10-CM | POA: Diagnosis not present

## 2023-07-28 DIAGNOSIS — I82412 Acute embolism and thrombosis of left femoral vein: Secondary | ICD-10-CM | POA: Diagnosis not present

## 2023-07-28 DIAGNOSIS — J9601 Acute respiratory failure with hypoxia: Secondary | ICD-10-CM | POA: Diagnosis not present

## 2023-07-28 DIAGNOSIS — I5023 Acute on chronic systolic (congestive) heart failure: Secondary | ICD-10-CM | POA: Diagnosis not present

## 2023-07-28 DIAGNOSIS — I469 Cardiac arrest, cause unspecified: Secondary | ICD-10-CM | POA: Diagnosis not present

## 2023-07-28 DIAGNOSIS — I2781 Cor pulmonale (chronic): Secondary | ICD-10-CM | POA: Diagnosis not present

## 2023-07-28 DIAGNOSIS — K921 Melena: Secondary | ICD-10-CM | POA: Diagnosis not present

## 2023-07-28 DIAGNOSIS — I272 Pulmonary hypertension, unspecified: Secondary | ICD-10-CM | POA: Diagnosis not present

## 2023-07-28 DIAGNOSIS — I480 Paroxysmal atrial fibrillation: Secondary | ICD-10-CM | POA: Diagnosis not present

## 2023-07-28 NOTE — Telephone Encounter (Signed)
 Called and spoke with patient, he is currently in [facility]. Advised patient I would call [facility] and give results and instructions.   Billy [name] and spoke with nurse that is caring for patient- advised her of changes above she wrote down changes and will make sure this gets done. Prescription with instructions sent to patients facility via fax.   Received verification from fax that prescription was successfully sent.

## 2023-07-28 NOTE — Telephone Encounter (Signed)
-----   Message from Tonye Becket sent at 07/28/2023  2:20 PM EDT ----- Please see below. Please contact.

## 2023-07-29 DIAGNOSIS — I272 Pulmonary hypertension, unspecified: Secondary | ICD-10-CM | POA: Diagnosis not present

## 2023-07-29 DIAGNOSIS — I5023 Acute on chronic systolic (congestive) heart failure: Secondary | ICD-10-CM | POA: Diagnosis not present

## 2023-07-29 DIAGNOSIS — I82412 Acute embolism and thrombosis of left femoral vein: Secondary | ICD-10-CM | POA: Diagnosis not present

## 2023-07-29 DIAGNOSIS — J90 Pleural effusion, not elsewhere classified: Secondary | ICD-10-CM | POA: Diagnosis not present

## 2023-07-29 DIAGNOSIS — K921 Melena: Secondary | ICD-10-CM | POA: Diagnosis not present

## 2023-07-29 DIAGNOSIS — I469 Cardiac arrest, cause unspecified: Secondary | ICD-10-CM | POA: Diagnosis not present

## 2023-07-29 DIAGNOSIS — I462 Cardiac arrest due to underlying cardiac condition: Secondary | ICD-10-CM | POA: Diagnosis not present

## 2023-07-29 DIAGNOSIS — R570 Cardiogenic shock: Secondary | ICD-10-CM | POA: Diagnosis not present

## 2023-07-29 DIAGNOSIS — I480 Paroxysmal atrial fibrillation: Secondary | ICD-10-CM | POA: Diagnosis not present

## 2023-07-29 DIAGNOSIS — I5033 Acute on chronic diastolic (congestive) heart failure: Secondary | ICD-10-CM | POA: Diagnosis not present

## 2023-07-29 DIAGNOSIS — I2781 Cor pulmonale (chronic): Secondary | ICD-10-CM | POA: Diagnosis not present

## 2023-07-29 DIAGNOSIS — J9601 Acute respiratory failure with hypoxia: Secondary | ICD-10-CM | POA: Diagnosis not present

## 2023-07-30 DIAGNOSIS — N1832 Chronic kidney disease, stage 3b: Secondary | ICD-10-CM | POA: Diagnosis not present

## 2023-07-30 DIAGNOSIS — F32A Depression, unspecified: Secondary | ICD-10-CM | POA: Diagnosis not present

## 2023-07-30 DIAGNOSIS — I5023 Acute on chronic systolic (congestive) heart failure: Secondary | ICD-10-CM | POA: Diagnosis not present

## 2023-07-30 DIAGNOSIS — Z515 Encounter for palliative care: Secondary | ICD-10-CM | POA: Diagnosis not present

## 2023-07-30 DIAGNOSIS — K219 Gastro-esophageal reflux disease without esophagitis: Secondary | ICD-10-CM | POA: Diagnosis not present

## 2023-07-30 DIAGNOSIS — I272 Pulmonary hypertension, unspecified: Secondary | ICD-10-CM | POA: Diagnosis not present

## 2023-07-30 DIAGNOSIS — E785 Hyperlipidemia, unspecified: Secondary | ICD-10-CM | POA: Diagnosis not present

## 2023-07-30 DIAGNOSIS — I2781 Cor pulmonale (chronic): Secondary | ICD-10-CM | POA: Diagnosis not present

## 2023-07-30 DIAGNOSIS — I480 Paroxysmal atrial fibrillation: Secondary | ICD-10-CM | POA: Diagnosis not present

## 2023-07-30 DIAGNOSIS — I462 Cardiac arrest due to underlying cardiac condition: Secondary | ICD-10-CM | POA: Diagnosis not present

## 2023-07-30 DIAGNOSIS — E1169 Type 2 diabetes mellitus with other specified complication: Secondary | ICD-10-CM | POA: Diagnosis not present

## 2023-07-30 DIAGNOSIS — R570 Cardiogenic shock: Secondary | ICD-10-CM | POA: Diagnosis not present

## 2023-07-31 DIAGNOSIS — F32A Depression, unspecified: Secondary | ICD-10-CM | POA: Diagnosis not present

## 2023-07-31 DIAGNOSIS — I462 Cardiac arrest due to underlying cardiac condition: Secondary | ICD-10-CM | POA: Diagnosis not present

## 2023-07-31 DIAGNOSIS — K219 Gastro-esophageal reflux disease without esophagitis: Secondary | ICD-10-CM | POA: Diagnosis not present

## 2023-07-31 DIAGNOSIS — I5023 Acute on chronic systolic (congestive) heart failure: Secondary | ICD-10-CM | POA: Diagnosis not present

## 2023-07-31 DIAGNOSIS — I2781 Cor pulmonale (chronic): Secondary | ICD-10-CM | POA: Diagnosis not present

## 2023-07-31 DIAGNOSIS — E785 Hyperlipidemia, unspecified: Secondary | ICD-10-CM | POA: Diagnosis not present

## 2023-07-31 DIAGNOSIS — Z515 Encounter for palliative care: Secondary | ICD-10-CM | POA: Diagnosis not present

## 2023-07-31 DIAGNOSIS — N1832 Chronic kidney disease, stage 3b: Secondary | ICD-10-CM | POA: Diagnosis not present

## 2023-07-31 DIAGNOSIS — I480 Paroxysmal atrial fibrillation: Secondary | ICD-10-CM | POA: Diagnosis not present

## 2023-07-31 DIAGNOSIS — I272 Pulmonary hypertension, unspecified: Secondary | ICD-10-CM | POA: Diagnosis not present

## 2023-07-31 DIAGNOSIS — R570 Cardiogenic shock: Secondary | ICD-10-CM | POA: Diagnosis not present

## 2023-07-31 DIAGNOSIS — E1169 Type 2 diabetes mellitus with other specified complication: Secondary | ICD-10-CM | POA: Diagnosis not present

## 2023-08-01 DIAGNOSIS — E1169 Type 2 diabetes mellitus with other specified complication: Secondary | ICD-10-CM | POA: Diagnosis not present

## 2023-08-01 DIAGNOSIS — R570 Cardiogenic shock: Secondary | ICD-10-CM | POA: Diagnosis not present

## 2023-08-01 DIAGNOSIS — I480 Paroxysmal atrial fibrillation: Secondary | ICD-10-CM | POA: Diagnosis not present

## 2023-08-01 DIAGNOSIS — F32A Depression, unspecified: Secondary | ICD-10-CM | POA: Diagnosis not present

## 2023-08-01 DIAGNOSIS — K219 Gastro-esophageal reflux disease without esophagitis: Secondary | ICD-10-CM | POA: Diagnosis not present

## 2023-08-01 DIAGNOSIS — E785 Hyperlipidemia, unspecified: Secondary | ICD-10-CM | POA: Diagnosis not present

## 2023-08-01 DIAGNOSIS — I5023 Acute on chronic systolic (congestive) heart failure: Secondary | ICD-10-CM | POA: Diagnosis not present

## 2023-08-01 DIAGNOSIS — I462 Cardiac arrest due to underlying cardiac condition: Secondary | ICD-10-CM | POA: Diagnosis not present

## 2023-08-01 DIAGNOSIS — I2781 Cor pulmonale (chronic): Secondary | ICD-10-CM | POA: Diagnosis not present

## 2023-08-01 DIAGNOSIS — N1832 Chronic kidney disease, stage 3b: Secondary | ICD-10-CM | POA: Diagnosis not present

## 2023-08-01 DIAGNOSIS — Z515 Encounter for palliative care: Secondary | ICD-10-CM | POA: Diagnosis not present

## 2023-08-01 DIAGNOSIS — I272 Pulmonary hypertension, unspecified: Secondary | ICD-10-CM | POA: Diagnosis not present

## 2023-08-02 DIAGNOSIS — K219 Gastro-esophageal reflux disease without esophagitis: Secondary | ICD-10-CM | POA: Diagnosis not present

## 2023-08-02 DIAGNOSIS — I272 Pulmonary hypertension, unspecified: Secondary | ICD-10-CM | POA: Diagnosis not present

## 2023-08-02 DIAGNOSIS — E785 Hyperlipidemia, unspecified: Secondary | ICD-10-CM | POA: Diagnosis not present

## 2023-08-02 DIAGNOSIS — F32A Depression, unspecified: Secondary | ICD-10-CM | POA: Diagnosis not present

## 2023-08-02 DIAGNOSIS — I480 Paroxysmal atrial fibrillation: Secondary | ICD-10-CM | POA: Diagnosis not present

## 2023-08-02 DIAGNOSIS — I2781 Cor pulmonale (chronic): Secondary | ICD-10-CM | POA: Diagnosis not present

## 2023-08-02 DIAGNOSIS — I5023 Acute on chronic systolic (congestive) heart failure: Secondary | ICD-10-CM | POA: Diagnosis not present

## 2023-08-02 DIAGNOSIS — I462 Cardiac arrest due to underlying cardiac condition: Secondary | ICD-10-CM | POA: Diagnosis not present

## 2023-08-02 DIAGNOSIS — Z515 Encounter for palliative care: Secondary | ICD-10-CM | POA: Diagnosis not present

## 2023-08-02 DIAGNOSIS — N1832 Chronic kidney disease, stage 3b: Secondary | ICD-10-CM | POA: Diagnosis not present

## 2023-08-02 DIAGNOSIS — R570 Cardiogenic shock: Secondary | ICD-10-CM | POA: Diagnosis not present

## 2023-08-02 DIAGNOSIS — E1169 Type 2 diabetes mellitus with other specified complication: Secondary | ICD-10-CM | POA: Diagnosis not present

## 2023-08-03 DIAGNOSIS — K219 Gastro-esophageal reflux disease without esophagitis: Secondary | ICD-10-CM | POA: Diagnosis not present

## 2023-08-03 DIAGNOSIS — E785 Hyperlipidemia, unspecified: Secondary | ICD-10-CM | POA: Diagnosis not present

## 2023-08-03 DIAGNOSIS — I462 Cardiac arrest due to underlying cardiac condition: Secondary | ICD-10-CM | POA: Diagnosis not present

## 2023-08-03 DIAGNOSIS — I480 Paroxysmal atrial fibrillation: Secondary | ICD-10-CM | POA: Diagnosis not present

## 2023-08-03 DIAGNOSIS — I2781 Cor pulmonale (chronic): Secondary | ICD-10-CM | POA: Diagnosis not present

## 2023-08-03 DIAGNOSIS — F32A Depression, unspecified: Secondary | ICD-10-CM | POA: Diagnosis not present

## 2023-08-03 DIAGNOSIS — E1169 Type 2 diabetes mellitus with other specified complication: Secondary | ICD-10-CM | POA: Diagnosis not present

## 2023-08-03 DIAGNOSIS — N1832 Chronic kidney disease, stage 3b: Secondary | ICD-10-CM | POA: Diagnosis not present

## 2023-08-03 DIAGNOSIS — I5023 Acute on chronic systolic (congestive) heart failure: Secondary | ICD-10-CM | POA: Diagnosis not present

## 2023-08-03 DIAGNOSIS — R570 Cardiogenic shock: Secondary | ICD-10-CM | POA: Diagnosis not present

## 2023-08-03 DIAGNOSIS — Z515 Encounter for palliative care: Secondary | ICD-10-CM | POA: Diagnosis not present

## 2023-08-03 DIAGNOSIS — I272 Pulmonary hypertension, unspecified: Secondary | ICD-10-CM | POA: Diagnosis not present

## 2023-08-08 DIAGNOSIS — Z515 Encounter for palliative care: Secondary | ICD-10-CM | POA: Diagnosis not present

## 2023-08-11 DIAGNOSIS — I5023 Acute on chronic systolic (congestive) heart failure: Secondary | ICD-10-CM | POA: Diagnosis not present

## 2023-08-11 DIAGNOSIS — R570 Cardiogenic shock: Secondary | ICD-10-CM | POA: Diagnosis not present

## 2023-08-11 DIAGNOSIS — I480 Paroxysmal atrial fibrillation: Secondary | ICD-10-CM | POA: Diagnosis not present

## 2023-08-11 DIAGNOSIS — E785 Hyperlipidemia, unspecified: Secondary | ICD-10-CM | POA: Diagnosis not present

## 2023-08-11 DIAGNOSIS — F32A Depression, unspecified: Secondary | ICD-10-CM | POA: Diagnosis not present

## 2023-08-11 DIAGNOSIS — K219 Gastro-esophageal reflux disease without esophagitis: Secondary | ICD-10-CM | POA: Diagnosis not present

## 2023-08-11 DIAGNOSIS — E1169 Type 2 diabetes mellitus with other specified complication: Secondary | ICD-10-CM | POA: Diagnosis not present

## 2023-08-11 DIAGNOSIS — N1832 Chronic kidney disease, stage 3b: Secondary | ICD-10-CM | POA: Diagnosis not present

## 2023-08-11 DIAGNOSIS — I2781 Cor pulmonale (chronic): Secondary | ICD-10-CM | POA: Diagnosis not present

## 2023-08-11 DIAGNOSIS — I272 Pulmonary hypertension, unspecified: Secondary | ICD-10-CM | POA: Diagnosis not present

## 2023-08-11 DIAGNOSIS — I462 Cardiac arrest due to underlying cardiac condition: Secondary | ICD-10-CM | POA: Diagnosis not present

## 2023-08-11 DIAGNOSIS — Z515 Encounter for palliative care: Secondary | ICD-10-CM | POA: Diagnosis not present

## 2023-08-13 DIAGNOSIS — E1169 Type 2 diabetes mellitus with other specified complication: Secondary | ICD-10-CM | POA: Diagnosis not present

## 2023-08-13 DIAGNOSIS — R570 Cardiogenic shock: Secondary | ICD-10-CM | POA: Diagnosis not present

## 2023-08-13 DIAGNOSIS — I272 Pulmonary hypertension, unspecified: Secondary | ICD-10-CM | POA: Diagnosis not present

## 2023-08-13 DIAGNOSIS — K219 Gastro-esophageal reflux disease without esophagitis: Secondary | ICD-10-CM | POA: Diagnosis not present

## 2023-08-13 DIAGNOSIS — I462 Cardiac arrest due to underlying cardiac condition: Secondary | ICD-10-CM | POA: Diagnosis not present

## 2023-08-13 DIAGNOSIS — I5023 Acute on chronic systolic (congestive) heart failure: Secondary | ICD-10-CM | POA: Diagnosis not present

## 2023-08-13 DIAGNOSIS — I480 Paroxysmal atrial fibrillation: Secondary | ICD-10-CM | POA: Diagnosis not present

## 2023-08-13 DIAGNOSIS — I2781 Cor pulmonale (chronic): Secondary | ICD-10-CM | POA: Diagnosis not present

## 2023-08-13 DIAGNOSIS — N1832 Chronic kidney disease, stage 3b: Secondary | ICD-10-CM | POA: Diagnosis not present

## 2023-08-13 DIAGNOSIS — F32A Depression, unspecified: Secondary | ICD-10-CM | POA: Diagnosis not present

## 2023-08-13 DIAGNOSIS — Z515 Encounter for palliative care: Secondary | ICD-10-CM | POA: Diagnosis not present

## 2023-08-13 DIAGNOSIS — E785 Hyperlipidemia, unspecified: Secondary | ICD-10-CM | POA: Diagnosis not present

## 2023-08-14 DIAGNOSIS — I469 Cardiac arrest, cause unspecified: Secondary | ICD-10-CM | POA: Diagnosis not present

## 2023-08-14 DIAGNOSIS — I462 Cardiac arrest due to underlying cardiac condition: Secondary | ICD-10-CM | POA: Diagnosis not present

## 2023-08-14 DIAGNOSIS — J9601 Acute respiratory failure with hypoxia: Secondary | ICD-10-CM | POA: Diagnosis not present

## 2023-08-14 DIAGNOSIS — I2781 Cor pulmonale (chronic): Secondary | ICD-10-CM | POA: Diagnosis not present

## 2023-08-14 DIAGNOSIS — J90 Pleural effusion, not elsewhere classified: Secondary | ICD-10-CM | POA: Diagnosis not present

## 2023-08-14 DIAGNOSIS — I82412 Acute embolism and thrombosis of left femoral vein: Secondary | ICD-10-CM | POA: Diagnosis not present

## 2023-08-14 DIAGNOSIS — I5023 Acute on chronic systolic (congestive) heart failure: Secondary | ICD-10-CM | POA: Diagnosis not present

## 2023-08-14 DIAGNOSIS — M6281 Muscle weakness (generalized): Secondary | ICD-10-CM | POA: Diagnosis not present

## 2023-08-14 DIAGNOSIS — R262 Difficulty in walking, not elsewhere classified: Secondary | ICD-10-CM | POA: Diagnosis not present

## 2023-08-14 DIAGNOSIS — I480 Paroxysmal atrial fibrillation: Secondary | ICD-10-CM | POA: Diagnosis not present

## 2023-08-14 DIAGNOSIS — I272 Pulmonary hypertension, unspecified: Secondary | ICD-10-CM | POA: Diagnosis not present

## 2023-08-14 DIAGNOSIS — R570 Cardiogenic shock: Secondary | ICD-10-CM | POA: Diagnosis not present

## 2023-08-15 DIAGNOSIS — N184 Chronic kidney disease, stage 4 (severe): Secondary | ICD-10-CM | POA: Diagnosis not present

## 2023-08-15 DIAGNOSIS — I130 Hypertensive heart and chronic kidney disease with heart failure and stage 1 through stage 4 chronic kidney disease, or unspecified chronic kidney disease: Secondary | ICD-10-CM | POA: Diagnosis not present

## 2023-08-15 DIAGNOSIS — N400 Enlarged prostate without lower urinary tract symptoms: Secondary | ICD-10-CM | POA: Diagnosis not present

## 2023-08-15 DIAGNOSIS — K922 Gastrointestinal hemorrhage, unspecified: Secondary | ICD-10-CM | POA: Diagnosis not present

## 2023-08-15 DIAGNOSIS — I469 Cardiac arrest, cause unspecified: Secondary | ICD-10-CM | POA: Diagnosis not present

## 2023-08-15 DIAGNOSIS — Z7901 Long term (current) use of anticoagulants: Secondary | ICD-10-CM | POA: Diagnosis not present

## 2023-08-15 DIAGNOSIS — I82422 Acute embolism and thrombosis of left iliac vein: Secondary | ICD-10-CM | POA: Diagnosis not present

## 2023-08-15 DIAGNOSIS — D631 Anemia in chronic kidney disease: Secondary | ICD-10-CM | POA: Diagnosis not present

## 2023-08-15 DIAGNOSIS — Z556 Problems related to health literacy: Secondary | ICD-10-CM | POA: Diagnosis not present

## 2023-08-15 DIAGNOSIS — I82531 Chronic embolism and thrombosis of right popliteal vein: Secondary | ICD-10-CM | POA: Diagnosis not present

## 2023-08-15 DIAGNOSIS — K219 Gastro-esophageal reflux disease without esophagitis: Secondary | ICD-10-CM | POA: Diagnosis not present

## 2023-08-15 DIAGNOSIS — E785 Hyperlipidemia, unspecified: Secondary | ICD-10-CM | POA: Diagnosis not present

## 2023-08-15 DIAGNOSIS — I272 Pulmonary hypertension, unspecified: Secondary | ICD-10-CM | POA: Diagnosis not present

## 2023-08-15 DIAGNOSIS — I82412 Acute embolism and thrombosis of left femoral vein: Secondary | ICD-10-CM | POA: Diagnosis not present

## 2023-08-15 DIAGNOSIS — E1122 Type 2 diabetes mellitus with diabetic chronic kidney disease: Secondary | ICD-10-CM | POA: Diagnosis not present

## 2023-08-15 DIAGNOSIS — I5023 Acute on chronic systolic (congestive) heart failure: Secondary | ICD-10-CM | POA: Diagnosis not present

## 2023-08-15 DIAGNOSIS — Z7984 Long term (current) use of oral hypoglycemic drugs: Secondary | ICD-10-CM | POA: Diagnosis not present

## 2023-08-15 DIAGNOSIS — J9601 Acute respiratory failure with hypoxia: Secondary | ICD-10-CM | POA: Diagnosis not present

## 2023-08-15 DIAGNOSIS — D500 Iron deficiency anemia secondary to blood loss (chronic): Secondary | ICD-10-CM | POA: Diagnosis not present

## 2023-08-15 DIAGNOSIS — J690 Pneumonitis due to inhalation of food and vomit: Secondary | ICD-10-CM | POA: Diagnosis not present

## 2023-08-15 DIAGNOSIS — F32A Depression, unspecified: Secondary | ICD-10-CM | POA: Diagnosis not present

## 2023-08-15 DIAGNOSIS — I480 Paroxysmal atrial fibrillation: Secondary | ICD-10-CM | POA: Diagnosis not present

## 2023-08-31 DIAGNOSIS — N183 Chronic kidney disease, stage 3 unspecified: Secondary | ICD-10-CM | POA: Diagnosis not present

## 2023-08-31 DIAGNOSIS — Z8674 Personal history of sudden cardiac arrest: Secondary | ICD-10-CM | POA: Diagnosis not present

## 2023-08-31 DIAGNOSIS — I480 Paroxysmal atrial fibrillation: Secondary | ICD-10-CM | POA: Diagnosis not present

## 2023-08-31 DIAGNOSIS — Z86718 Personal history of other venous thrombosis and embolism: Secondary | ICD-10-CM | POA: Diagnosis not present

## 2023-08-31 DIAGNOSIS — I5022 Chronic systolic (congestive) heart failure: Secondary | ICD-10-CM | POA: Diagnosis not present

## 2023-08-31 DIAGNOSIS — E7800 Pure hypercholesterolemia, unspecified: Secondary | ICD-10-CM | POA: Diagnosis not present

## 2023-08-31 DIAGNOSIS — J90 Pleural effusion, not elsewhere classified: Secondary | ICD-10-CM | POA: Diagnosis not present

## 2023-08-31 DIAGNOSIS — Z8701 Personal history of pneumonia (recurrent): Secondary | ICD-10-CM | POA: Diagnosis not present

## 2023-08-31 DIAGNOSIS — K921 Melena: Secondary | ICD-10-CM | POA: Diagnosis not present

## 2023-08-31 DIAGNOSIS — I10 Essential (primary) hypertension: Secondary | ICD-10-CM | POA: Diagnosis not present

## 2023-08-31 DIAGNOSIS — Z742 Need for assistance at home and no other household member able to render care: Secondary | ICD-10-CM | POA: Diagnosis not present

## 2023-09-08 DIAGNOSIS — R300 Dysuria: Secondary | ICD-10-CM | POA: Diagnosis not present

## 2023-09-08 DIAGNOSIS — R944 Abnormal results of kidney function studies: Secondary | ICD-10-CM | POA: Diagnosis not present

## 2023-09-08 DIAGNOSIS — D649 Anemia, unspecified: Secondary | ICD-10-CM | POA: Diagnosis not present

## 2023-09-08 DIAGNOSIS — L299 Pruritus, unspecified: Secondary | ICD-10-CM | POA: Diagnosis not present

## 2023-09-08 DIAGNOSIS — N481 Balanitis: Secondary | ICD-10-CM | POA: Diagnosis not present

## 2023-09-08 DIAGNOSIS — I5022 Chronic systolic (congestive) heart failure: Secondary | ICD-10-CM | POA: Diagnosis not present

## 2023-09-12 ENCOUNTER — Telehealth: Payer: Self-pay

## 2023-09-12 DIAGNOSIS — N183 Chronic kidney disease, stage 3 unspecified: Secondary | ICD-10-CM

## 2023-09-13 ENCOUNTER — Telehealth: Payer: Self-pay | Admitting: Pharmacist

## 2023-09-13 NOTE — Progress Notes (Signed)
   09/13/2023  Patient ID: Billy [name], male   DOB: [birth year], 73 y.o.   MRN: [id number]  Received referral from PCP regarding medication review.  Hospitalized on 3/10: Heart attack/ cardiac arrest; Acute DVT, GI hemorrhage with melena, CKD stage IIIb, aspiration pneumonia of right upper lobe, paroxysmal A-fib, hypertension, acute heart failure with midrange ejection fraction high cholesterol, cardiac arrest, pleural effusion on right, management of chest tube  Went to [name] on 4/10  Called and spoke to the patient on the phone today. Confirmed taking medications appropriately. Recently discussed that he did stop the Jardiance  and will go ahead to stop the potassium supplement today. Reviewed labs today in detail. Requested I email a review of them later today for a better understanding and to also have my email/phone number for later use regarding concerns surrounding medications.   For current concerns, the itching has gone away since stopping Jardiance  (continues with Entresto ). For the penile swelling, it has partially gone away with the cream but still present. Will hopefully resolve with a little more time. Finished prednisone and antibiotics at this point. Reports still having blood in stools, but meeting with GI next week. Continuing iron  supplements until then.   Delvin File, PharmD [facility]  Phone Number: [phone]

## 2023-09-13 NOTE — Telephone Encounter (Signed)
 Ok thank you for the update.   Ronasia Isola NP-C  .npw

## 2023-09-18 ENCOUNTER — Telehealth: Payer: Self-pay

## 2023-09-18 DIAGNOSIS — R935 Abnormal findings on diagnostic imaging of other abdominal regions, including retroperitoneum: Secondary | ICD-10-CM | POA: Diagnosis not present

## 2023-09-18 DIAGNOSIS — Z7901 Long term (current) use of anticoagulants: Secondary | ICD-10-CM | POA: Diagnosis not present

## 2023-09-18 DIAGNOSIS — D649 Anemia, unspecified: Secondary | ICD-10-CM | POA: Diagnosis not present

## 2023-09-18 NOTE — Telephone Encounter (Signed)
   Pre-operative Risk Assessment    Patient Name: Billy [name]  DOB: 24-Feb-1951 MRN: [id number]   Date of last office visit: 03/27/23 Lawana Pray, NP Date of next office visit: 09/27/23 Lequita Ranger, NP   Request for Surgical Clearance    Procedure:  COLONOSCOPY  Date of Surgery:  Clearance TBD (12/2023)                                Surgeon:  DR Evangeline Hilts Surgeon's Group or Practice Name:  [facility] Phone number:  [phone] Fax number:  [phone]   Type of Clearance Requested:   - Medical  - Pharmacy:  Hold Apixaban  (Eliquis ) 2 DAYS PRIOR   Type of Anesthesia:  PROPOFOL    Additional requests/questions:    Signed, Collin Deal   09/18/2023, 3:00 PM

## 2023-09-19 NOTE — Telephone Encounter (Signed)
 Patient with diagnosis of PAF and hx of DVT on Eliquis  for anticoagulation.    Procedure: COLONOSCOPY  Date of procedure: 12/2023( TBD)   CHA2DS2-VASc Score = 6   This indicates a 9.7% annual risk of stroke. The patient's score is based upon: CHF History: 1 HTN History: 1 Diabetes History: 0 Stroke History: 2 Vascular Disease History: 1 Age Score: 1 Gender Score: 0     CrCl 33 mL/min  Platelet count 210  Patient has not had an Afib/aflutter ablation within the last 3 months or DCCV within the last 30 days   Per office protocol, patient can hold Eliquis  for 2 days prior to procedure.     **This guidance is not considered finalized until pre-operative APP has relayed final recommendations.**

## 2023-09-22 ENCOUNTER — Telehealth: Payer: Self-pay | Admitting: Cardiovascular Disease

## 2023-09-22 MED ORDER — AMIODARONE HCL 200 MG PO TABS: 200.0000 mg | ORAL_TABLET | Freq: Every day | ORAL | 1 refills | Status: DC

## 2023-09-22 NOTE — Telephone Encounter (Signed)
° ° ° ° °*  STAT* If patient is at the pharmacy, call can be transferred to refill team.   1. Which medications need to be refilled? (please list name of each medication and dose if known) amiodarone (PACERONE) 200 MG tablet  2. Which pharmacy/location (including street and city if local pharmacy) is medication to be sent to? [id number] - [place], [place] - [address] AT [address]  3. Do they need a 30 day or 90 day supply? Billy [name]

## 2023-09-22 NOTE — Telephone Encounter (Signed)
 Pt of Dr. Katheryne Pane. Amiodarone  was prescribed after last followup with Lawana Pray NP. As this was prescribed in the hospital, does Dr. Katheryne Pane want to refill? Please advise.
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
## 2023-09-27 ENCOUNTER — Telehealth: Payer: Self-pay | Admitting: Nurse Practitioner

## 2023-09-27 ENCOUNTER — Encounter: Payer: Self-pay | Admitting: Nurse Practitioner

## 2023-09-27 ENCOUNTER — Ambulatory Visit: Attending: Nurse Practitioner | Admitting: Nurse Practitioner

## 2023-09-27 VITALS — BP 108/62 | HR 74 | Ht 73.0 in | Wt 165.0 lb

## 2023-09-27 DIAGNOSIS — I2510 Atherosclerotic heart disease of native coronary artery without angina pectoris: Secondary | ICD-10-CM

## 2023-09-27 DIAGNOSIS — I480 Paroxysmal atrial fibrillation: Secondary | ICD-10-CM | POA: Diagnosis not present

## 2023-09-27 DIAGNOSIS — E782 Mixed hyperlipidemia: Secondary | ICD-10-CM

## 2023-09-27 DIAGNOSIS — Z8673 Personal history of transient ischemic attack (TIA), and cerebral infarction without residual deficits: Secondary | ICD-10-CM | POA: Diagnosis not present

## 2023-09-27 DIAGNOSIS — I10 Essential (primary) hypertension: Secondary | ICD-10-CM

## 2023-09-27 DIAGNOSIS — I5022 Chronic systolic (congestive) heart failure: Secondary | ICD-10-CM | POA: Diagnosis not present

## 2023-09-27 MED ORDER — APIXABAN 5 MG PO TABS: 5.0000 mg | ORAL_TABLET | Freq: Two times a day (BID) | ORAL | 1 refills | Status: DC

## 2023-09-27 NOTE — Patient Instructions (Signed)
 Medication Instructions:  Your physician recommends that you continue on your current medications as directed. Please refer to the Current Medication list given to you today. *If you need a refill on your cardiac medications before your next appointment, please call your pharmacy*  Lab Work: TODAY-BMET & CBC If you have labs (blood work) drawn today and your tests are completely normal, you will receive your results only by: MyChart Message (if you have MyChart) OR A paper copy in the mail If you have any lab test that is abnormal or we need to change your treatment, we will call you to review the results.  Testing/Procedures: NONE ORDERED  Follow-Up: At [facility], you and your health needs are our priority.  As part of our continuing mission to provide you with exceptional heart care, our providers are all part of one team.  This team includes your primary Cardiologist (physician) and Advanced Practice Providers or APPs (Physician Assistants and Nurse Practitioners) who all work together to provide you with the care you need, when you need it.  Your next appointment:   2 month(s)  Provider:   Charles Connor, NP   We recommend signing up for the patient portal called MyChart.  Sign up information is provided on this After Visit Summary.  MyChart is used to connect with patients for Virtual Visits (Telemedicine).  Patients are able to view lab/test results, encounter notes, upcoming appointments, etc.  Non-urgent messages can be sent to your provider as well.   To learn more about what you can do with MyChart, go to [email or web address].   Other Instructions

## 2023-09-27 NOTE — Telephone Encounter (Signed)
 Prescription refill request for Eliquis  received. Indication: AF Last office visit: 09/27/23  Sharlene Dawn NP Scr: 2.35 on 07/27/23  Epic Age: 73 Weight: 74.8kg  Based on above findings Eliquis  5mg  twice daily is the appropriate dose.  Refill approved.

## 2023-09-27 NOTE — Telephone Encounter (Signed)
*  STAT* If patient is at the pharmacy, call can be transferred to refill team.   1. Which medications need to be refilled? (please list name of each medication and dose if known) need a new prescription for Eliquis    2. Would you like to learn more about the convenience, safety, & potential cost savings by using the [facility]?     3. Are you open to using the [facility] (Type [facility]. .   4. Which pharmacy/location (including street and city if local pharmacy) is medication to be sent to? [facility] [address]   5. Do they need a 30 day or 90 day supply? 90 days and refills  please put it in  for auto refills- please call in today- out of medicine

## 2023-09-28 ENCOUNTER — Ambulatory Visit: Payer: Self-pay | Admitting: Nurse Practitioner

## 2023-09-28 DIAGNOSIS — E782 Mixed hyperlipidemia: Secondary | ICD-10-CM

## 2023-09-28 DIAGNOSIS — I480 Paroxysmal atrial fibrillation: Secondary | ICD-10-CM

## 2023-09-28 LAB — COMPREHENSIVE METABOLIC PANEL WITH GFR
ALT: 15 IU/L (ref 0–44)
AST: 19 IU/L (ref 0–40)
Albumin: 3.6 g/dL — ABNORMAL LOW (ref 3.9–4.9)
Alkaline Phosphatase: 71 IU/L (ref 44–121)
BUN/Creatinine Ratio: 22 (ref 10–24)
BUN: 46 mg/dL — ABNORMAL HIGH (ref 8–27)
Bilirubin Total: 0.4 mg/dL (ref 0.0–1.2)
CO2: 19 mmol/L — ABNORMAL LOW (ref 20–29)
Calcium: 8.8 mg/dL (ref 8.6–10.2)
Chloride: 103 mmol/L (ref 96–106)
Creatinine, Ser: 2.06 mg/dL — ABNORMAL HIGH (ref 0.76–1.27)
Globulin, Total: 2.7 g/dL (ref 1.5–4.5)
Glucose: 102 mg/dL — ABNORMAL HIGH (ref 70–99)
Potassium: 5.2 mmol/L (ref 3.5–5.2)
Sodium: 140 mmol/L (ref 134–144)
Total Protein: 6.3 g/dL (ref 6.0–8.5)
eGFR: 34 mL/min/{1.73_m2} — ABNORMAL LOW (ref >59)

## 2023-09-28 LAB — CBC
Hematocrit: 40.5 % (ref 37.5–51.0)
Hemoglobin: 12 g/dL — ABNORMAL LOW (ref 13.0–17.7)
MCH: 26.1 pg — ABNORMAL LOW (ref 26.6–33.0)
MCHC: 29.6 g/dL — ABNORMAL LOW (ref 31.5–35.7)
MCV: 88 fL (ref 79–97)
Platelets: 238 10*3/uL (ref 150–450)
RBC: 4.6 x10E6/uL (ref 4.14–5.80)
RDW: 17.3 % — ABNORMAL HIGH (ref 11.6–15.4)
WBC: 8.3 10*3/uL (ref 3.4–10.8)

## 2023-10-02 ENCOUNTER — Ambulatory Visit: Attending: Nurse Practitioner

## 2023-10-02 DIAGNOSIS — I480 Paroxysmal atrial fibrillation: Secondary | ICD-10-CM

## 2023-10-02 DIAGNOSIS — E782 Mixed hyperlipidemia: Secondary | ICD-10-CM

## 2023-10-02 NOTE — Progress Notes (Unsigned)
 Enrolled for [employer] to mail a ZIO XT long term holter monitor to the patients address on file.   Dr. Allyson Sabal to read.

## 2023-10-06 ENCOUNTER — Other Ambulatory Visit: Payer: Self-pay

## 2023-10-06 DIAGNOSIS — I480 Paroxysmal atrial fibrillation: Secondary | ICD-10-CM

## 2023-10-06 DIAGNOSIS — E782 Mixed hyperlipidemia: Secondary | ICD-10-CM

## 2023-10-06 NOTE — Telephone Encounter (Signed)
 Pt calling requesting cb even thou son was spoken to

## 2023-10-10 MED ORDER — SACUBITRIL-VALSARTAN 97-103 MG PO TABS: 1.0000 | ORAL_TABLET | Freq: Two times a day (BID) | ORAL | 2 refills | Status: DC

## 2023-10-10 NOTE — Telephone Encounter (Signed)
 Returned the call to the patient. Attempted to give over results but patient wanted to review his medication and make sure all his rxs had refills. He was also upset that I contacted his son because he said his son was overacts. He states his son does not think he is taking care of himself. We reviewed the medication and lab results. Advised the patient to review monitor instructions and contact the office or the number on the box with questions. Patient advised to complete labs soon at the closest [facility]. Patient voiced understanding. I spent twenty-five minutes on the phone with the patient listening to his concerns and reviewing meds and lab results.   [facility].

## 2023-10-12 DIAGNOSIS — N184 Chronic kidney disease, stage 4 (severe): Secondary | ICD-10-CM | POA: Diagnosis not present

## 2023-10-12 DIAGNOSIS — L299 Pruritus, unspecified: Secondary | ICD-10-CM | POA: Diagnosis not present

## 2023-10-21 DIAGNOSIS — H2513 Age-related nuclear cataract, bilateral: Secondary | ICD-10-CM | POA: Diagnosis not present

## 2023-10-30 ENCOUNTER — Telehealth: Payer: Self-pay | Admitting: Nurse Practitioner

## 2023-10-30 NOTE — Telephone Encounter (Signed)
*  STAT* If patient is at the pharmacy, call can be transferred to refill team.   1. Which medications need to be refilled? (please list name of each medication and dose if known)   torsemide  (DEMADEX ) 20 MG tablet    2. Which pharmacy/location (including street and city if local pharmacy) is medication to be sent to? [id number] [place] [place] - [address] AT [facility] [address] Phone: [phone]  Fax: [phone]     3. Do they need a 30 day or 90 day supply? 90

## 2023-10-30 NOTE — Telephone Encounter (Signed)
 Pt of Dr. Court. This RX was not prescribed by [facility] it looks like. Says historical. Also, this pt was seen at [facility] recently. Has a followup with them needed. He saw Jackee Alberts PA most recently. Please advise on this refill.

## 2023-10-31 MED ORDER — TORSEMIDE 20 MG PO TABS: 20.0000 mg | ORAL_TABLET | Freq: Every day | ORAL | 3 refills | Status: DC

## 2023-11-19 ENCOUNTER — Other Ambulatory Visit: Payer: Self-pay | Admitting: Vascular Surgery

## 2023-11-22 NOTE — Telephone Encounter (Signed)
   Patient Name: Billy [name]  DOB: [birth year] MRN: [id number]  Primary Cardiologist: Dorn Lesches, MD  Preoperative Clearance: -The patient affirms he has been doing well without any new cardiac symptoms. They are able to achieve 6 METS without cardiac limitations. Therefore, based on ACC/AHA guidelines, the patient would be at acceptable risk for the planned procedure without further cardiovascular testing. The patient was advised that if he develops new symptoms prior to surgery to contact our office to arrange for a follow-up visit, and he verbalized understanding.  Patient with diagnosis of PAF and hx of DVT on Eliquis  for anticoagulation.     Patient has not had an Afib/aflutter ablation within the last 3 months or DCCV within the last 30 days  Per office protocol, patient can hold Eliquis  for 2 days prior to procedure.        The patient was advised that if he develops new symptoms prior to surgery to contact our office to arrange for a follow-up visit, and he verbalized understanding.  I will route this recommendation to the requesting party via Epic fax function and remove from pre-op pool.  Please call with questions.  Lamarr Satterfield, NP 11/22/2023, 9:52 AM

## 2023-11-22 NOTE — Telephone Encounter (Signed)
 2nd clearance request received. Will route to Preop Pool for review.

## 2023-12-06 DIAGNOSIS — I480 Paroxysmal atrial fibrillation: Secondary | ICD-10-CM | POA: Diagnosis not present

## 2023-12-06 DIAGNOSIS — E782 Mixed hyperlipidemia: Secondary | ICD-10-CM | POA: Diagnosis not present

## 2023-12-07 ENCOUNTER — Ambulatory Visit: Payer: Self-pay | Admitting: Nurse Practitioner

## 2023-12-17 DIAGNOSIS — S80811A Abrasion, right lower leg, initial encounter: Secondary | ICD-10-CM | POA: Diagnosis not present
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
## 2023-12-19 ENCOUNTER — Encounter: Payer: Self-pay | Admitting: Nurse Practitioner

## 2023-12-19 ENCOUNTER — Ambulatory Visit: Attending: Nurse Practitioner | Admitting: Nurse Practitioner

## 2023-12-19 VITALS — BP 116/54 | HR 61 | Ht 73.0 in | Wt 166.8 lb

## 2023-12-19 DIAGNOSIS — E782 Mixed hyperlipidemia: Secondary | ICD-10-CM

## 2023-12-19 DIAGNOSIS — L03119 Cellulitis of unspecified part of limb: Secondary | ICD-10-CM | POA: Diagnosis not present

## 2023-12-19 DIAGNOSIS — N6001 Solitary cyst of right breast: Secondary | ICD-10-CM | POA: Diagnosis not present

## 2023-12-19 DIAGNOSIS — I10 Essential (primary) hypertension: Secondary | ICD-10-CM | POA: Diagnosis not present

## 2023-12-19 DIAGNOSIS — I5022 Chronic systolic (congestive) heart failure: Secondary | ICD-10-CM

## 2023-12-19 DIAGNOSIS — I480 Paroxysmal atrial fibrillation: Secondary | ICD-10-CM | POA: Diagnosis not present

## 2023-12-19 DIAGNOSIS — L03115 Cellulitis of right lower limb: Secondary | ICD-10-CM | POA: Diagnosis not present

## 2023-12-19 DIAGNOSIS — I2510 Atherosclerotic heart disease of native coronary artery without angina pectoris: Secondary | ICD-10-CM | POA: Diagnosis not present

## 2023-12-19 DIAGNOSIS — Z23 Encounter for immunization: Secondary | ICD-10-CM | POA: Diagnosis not present

## 2023-12-19 DIAGNOSIS — N6341 Unspecified lump in right breast, subareolar: Secondary | ICD-10-CM | POA: Diagnosis not present

## 2023-12-19 DIAGNOSIS — S80811D Abrasion, right lower leg, subsequent encounter: Secondary | ICD-10-CM | POA: Diagnosis not present

## 2023-12-19 NOTE — Patient Instructions (Signed)
 Medication Instructions:  Your physician recommends that you continue on your current medications as directed. Please refer to the Current Medication list given to you today. *If you need a refill on your cardiac medications before your next appointment, please call your pharmacy*  Lab Work: TODAY-CMET & TSH If you have labs (blood work) drawn today and your tests are completely normal, you will receive your results only by: MyChart Message (if you have MyChart) OR A paper copy in the mail If you have any lab test that is abnormal or we need to change your treatment, we will call you to review the results.  Testing/Procedures: Your physician has requested that you have a carotid duplex. This test is an ultrasound of the carotid arteries in your neck. It looks at blood flow through these arteries that supply the brain with blood. Allow one hour for this exam. There are no restrictions or special instructions.   Your physician has requested that you have an echocardiogram. Echocardiography is a painless test that uses sound waves to create images of your heart. It provides your doctor with information about the size and shape of your heart and how well your heart's chambers and valves are working. This procedure takes approximately one hour. There are no restrictions for this procedure. Please do NOT wear cologne, perfume, aftershave, or lotions (deodorant is allowed). Please arrive 15 minutes prior to your appointment time.  Please note: We ask at that you not bring children with you during ultrasound (echo/ vascular) testing. Due to room size and safety concerns, children are not allowed in the ultrasound rooms during exams. Our front office staff cannot provide observation of children in our lobby area while testing is being conducted. An adult accompanying a patient to their appointment will only be allowed in the ultrasound room at the discretion of the ultrasound technician under special  circumstances. We apologize for any inconvenience.   Follow-Up: At [facility], [place], you and your health needs are our priority.  As part of our continuing mission to provide you with exceptional heart care, our providers are all part of one team.  This team includes your primary Cardiologist (physician) and Advanced Practice Providers or APPs (Physician Assistants and Nurse Practitioners) who all work together to provide you with the care you need, when you need it.  Your next appointment:   6 month(s)  Provider:   Dorn Lesches, MD or ANY APP   We recommend signing up for the patient portal called MyChart.  Sign up information is provided on this After Visit Summary.  MyChart is used to connect with patients for Virtual Visits (Telemedicine).  Patients are able to view lab/test results, encounter notes, upcoming appointments, etc.  Non-urgent messages can be sent to your provider as well.   To learn more about what you can do with MyChart, go to [email or web address].   Other Instructions

## 2023-12-21 ENCOUNTER — Other Ambulatory Visit: Payer: Self-pay | Admitting: Family Medicine

## 2023-12-21 DIAGNOSIS — L03115 Cellulitis of right lower limb: Secondary | ICD-10-CM | POA: Diagnosis not present

## 2023-12-21 DIAGNOSIS — N6341 Unspecified lump in right breast, subareolar: Secondary | ICD-10-CM

## 2023-12-26 ENCOUNTER — Ambulatory Visit
Admission: RE | Admit: 2023-12-26 | Discharge: 2023-12-26 | Disposition: A | Discharge status: Home / Self Care | Source: Ambulatory Visit | Attending: Family Medicine | Admitting: Family Medicine

## 2023-12-26 DIAGNOSIS — R92323 Mammographic fibroglandular density, bilateral breasts: Secondary | ICD-10-CM | POA: Diagnosis not present

## 2023-12-26 DIAGNOSIS — N62 Hypertrophy of breast: Secondary | ICD-10-CM | POA: Diagnosis not present

## 2023-12-26 DIAGNOSIS — N6341 Unspecified lump in right breast, subareolar: Secondary | ICD-10-CM

## 2023-12-26 DIAGNOSIS — R928 Other abnormal and inconclusive findings on diagnostic imaging of breast: Secondary | ICD-10-CM | POA: Diagnosis not present

## 2023-12-28 DIAGNOSIS — R609 Edema, unspecified: Secondary | ICD-10-CM | POA: Diagnosis not present

## 2023-12-28 DIAGNOSIS — I878 Other specified disorders of veins: Secondary | ICD-10-CM | POA: Diagnosis not present

## 2024-01-01 DIAGNOSIS — I2510 Atherosclerotic heart disease of native coronary artery without angina pectoris: Secondary | ICD-10-CM | POA: Diagnosis not present

## 2024-01-01 DIAGNOSIS — I480 Paroxysmal atrial fibrillation: Secondary | ICD-10-CM | POA: Diagnosis not present

## 2024-01-01 DIAGNOSIS — I10 Essential (primary) hypertension: Secondary | ICD-10-CM | POA: Diagnosis not present

## 2024-01-01 DIAGNOSIS — I5022 Chronic systolic (congestive) heart failure: Secondary | ICD-10-CM | POA: Diagnosis not present

## 2024-01-01 DIAGNOSIS — E782 Mixed hyperlipidemia: Secondary | ICD-10-CM | POA: Diagnosis not present

## 2024-01-02 ENCOUNTER — Ambulatory Visit: Payer: Self-pay | Admitting: Nurse Practitioner

## 2024-01-02 LAB — COMPREHENSIVE METABOLIC PANEL WITH GFR
ALT: 27 IU/L (ref 0–44)
AST: 35 IU/L (ref 0–40)
Albumin: 3.8 g/dL — ABNORMAL LOW (ref 3.9–4.9)
Alkaline Phosphatase: 61 IU/L (ref 49–135)
BUN/Creatinine Ratio: 19 (ref 10–24)
BUN: 42 mg/dL — ABNORMAL HIGH (ref 8–27)
Bilirubin Total: <0.2 mg/dL (ref 0.0–1.2)
CO2: 18 mmol/L — ABNORMAL LOW (ref 20–29)
Calcium: 8.9 mg/dL (ref 8.6–10.2)
Chloride: 103 mmol/L (ref 96–106)
Creatinine, Ser: 2.23 mg/dL — ABNORMAL HIGH (ref 0.76–1.27)
Globulin, Total: 2.3 g/dL (ref 1.5–4.5)
Glucose: 90 mg/dL (ref 70–99)
Potassium: 4.6 mmol/L (ref 3.5–5.2)
Sodium: 141 mmol/L (ref 134–144)
Total Protein: 6.1 g/dL (ref 6.0–8.5)
eGFR: 31 mL/min/1.73 — ABNORMAL LOW (ref >59)

## 2024-01-02 LAB — TSH: TSH: 4.33 u[IU]/mL (ref 0.450–4.500)

## 2024-01-04 ENCOUNTER — Other Ambulatory Visit

## 2024-01-04 ENCOUNTER — Encounter

## 2024-01-04 ENCOUNTER — Ambulatory Visit (HOSPITAL_BASED_OUTPATIENT_CLINIC_OR_DEPARTMENT_OTHER)
Admission: RE | Admit: 2024-01-04 | Discharge: 2024-01-04 | Disposition: A | Discharge status: Home / Self Care | Source: Ambulatory Visit | Attending: Nurse Practitioner | Admitting: Nurse Practitioner

## 2024-01-04 ENCOUNTER — Ambulatory Visit (HOSPITAL_COMMUNITY)
Admission: RE | Admit: 2024-01-04 | Discharge: 2024-01-04 | Disposition: A | Discharge status: Home / Self Care | Source: Ambulatory Visit | Attending: Nurse Practitioner | Admitting: Nurse Practitioner

## 2024-01-04 DIAGNOSIS — Z8673 Personal history of transient ischemic attack (TIA), and cerebral infarction without residual deficits: Secondary | ICD-10-CM

## 2024-01-04 DIAGNOSIS — I2510 Atherosclerotic heart disease of native coronary artery without angina pectoris: Secondary | ICD-10-CM | POA: Insufficient documentation

## 2024-01-04 DIAGNOSIS — I5022 Chronic systolic (congestive) heart failure: Secondary | ICD-10-CM | POA: Diagnosis not present

## 2024-01-04 DIAGNOSIS — I10 Essential (primary) hypertension: Secondary | ICD-10-CM

## 2024-01-04 DIAGNOSIS — E782 Mixed hyperlipidemia: Secondary | ICD-10-CM

## 2024-01-04 DIAGNOSIS — I480 Paroxysmal atrial fibrillation: Secondary | ICD-10-CM | POA: Insufficient documentation

## 2024-01-05 LAB — ECHOCARDIOGRAM COMPLETE
AR max vel: 2.45 cm2
AV Area VTI: 2.52 cm2
AV Area mean vel: 2.34 cm2
AV Mean grad: 3 mmHg
AV Peak grad: 4.8 mmHg
Ao pk vel: 1.09 m/s
Area-P 1/2: 3.77 cm2
S' Lateral: 3.38 cm

## 2024-01-19 DIAGNOSIS — R112 Nausea with vomiting, unspecified: Secondary | ICD-10-CM | POA: Diagnosis not present

## 2024-01-19 DIAGNOSIS — K625 Hemorrhage of anus and rectum: Secondary | ICD-10-CM | POA: Diagnosis not present

## 2024-01-19 DIAGNOSIS — Z7901 Long term (current) use of anticoagulants: Secondary | ICD-10-CM | POA: Diagnosis not present

## 2024-01-21 ENCOUNTER — Emergency Department (HOSPITAL_COMMUNITY)

## 2024-01-21 ENCOUNTER — Other Ambulatory Visit: Payer: Self-pay

## 2024-01-21 ENCOUNTER — Inpatient Hospital Stay (HOSPITAL_COMMUNITY)
Admission: EM | Admit: 2024-01-21 | Discharge: 2024-04-29 | DRG: 326 | Disposition: A | Discharge status: Skilled Nursing Facility | Attending: Family Medicine | Admitting: Family Medicine

## 2024-01-21 DIAGNOSIS — I2510 Atherosclerotic heart disease of native coronary artery without angina pectoris: Secondary | ICD-10-CM | POA: Diagnosis not present

## 2024-01-21 DIAGNOSIS — K567 Ileus, unspecified: Secondary | ICD-10-CM | POA: Diagnosis not present

## 2024-01-21 DIAGNOSIS — I10 Essential (primary) hypertension: Secondary | ICD-10-CM | POA: Diagnosis not present

## 2024-01-21 DIAGNOSIS — D631 Anemia in chronic kidney disease: Secondary | ICD-10-CM | POA: Diagnosis present

## 2024-01-21 DIAGNOSIS — Y848 Other medical procedures as the cause of abnormal reaction of the patient, or of later complication, without mention of misadventure at the time of the procedure: Secondary | ICD-10-CM | POA: Diagnosis not present

## 2024-01-21 DIAGNOSIS — J9811 Atelectasis: Secondary | ICD-10-CM | POA: Diagnosis not present

## 2024-01-21 DIAGNOSIS — D649 Anemia, unspecified: Secondary | ICD-10-CM | POA: Diagnosis not present

## 2024-01-21 DIAGNOSIS — Z992 Dependence on renal dialysis: Secondary | ICD-10-CM | POA: Diagnosis not present

## 2024-01-21 DIAGNOSIS — I132 Hypertensive heart and chronic kidney disease with heart failure and with stage 5 chronic kidney disease, or end stage renal disease: Secondary | ICD-10-CM | POA: Diagnosis present

## 2024-01-21 DIAGNOSIS — J90 Pleural effusion, not elsewhere classified: Secondary | ICD-10-CM | POA: Diagnosis not present

## 2024-01-21 DIAGNOSIS — J811 Chronic pulmonary edema: Secondary | ICD-10-CM | POA: Diagnosis not present

## 2024-01-21 DIAGNOSIS — I5020 Unspecified systolic (congestive) heart failure: Secondary | ICD-10-CM | POA: Diagnosis not present

## 2024-01-21 DIAGNOSIS — I701 Atherosclerosis of renal artery: Secondary | ICD-10-CM | POA: Diagnosis not present

## 2024-01-21 DIAGNOSIS — F319 Bipolar disorder, unspecified: Secondary | ICD-10-CM | POA: Diagnosis present

## 2024-01-21 DIAGNOSIS — I252 Old myocardial infarction: Secondary | ICD-10-CM

## 2024-01-21 DIAGNOSIS — I5032 Chronic diastolic (congestive) heart failure: Secondary | ICD-10-CM | POA: Diagnosis not present

## 2024-01-21 DIAGNOSIS — J9690 Respiratory failure, unspecified, unspecified whether with hypoxia or hypercapnia: Secondary | ICD-10-CM | POA: Diagnosis not present

## 2024-01-21 DIAGNOSIS — E7800 Pure hypercholesterolemia, unspecified: Secondary | ICD-10-CM | POA: Diagnosis present

## 2024-01-21 DIAGNOSIS — J9601 Acute respiratory failure with hypoxia: Secondary | ICD-10-CM | POA: Diagnosis not present

## 2024-01-21 DIAGNOSIS — Z885 Allergy status to narcotic agent status: Secondary | ICD-10-CM

## 2024-01-21 DIAGNOSIS — Y92239 Unspecified place in hospital as the place of occurrence of the external cause: Secondary | ICD-10-CM | POA: Diagnosis not present

## 2024-01-21 DIAGNOSIS — E43 Unspecified severe protein-calorie malnutrition: Secondary | ICD-10-CM | POA: Diagnosis present

## 2024-01-21 DIAGNOSIS — R233 Spontaneous ecchymoses: Secondary | ICD-10-CM | POA: Diagnosis not present

## 2024-01-21 DIAGNOSIS — Z7189 Other specified counseling: Secondary | ICD-10-CM | POA: Diagnosis not present

## 2024-01-21 DIAGNOSIS — K2101 Gastro-esophageal reflux disease with esophagitis, with bleeding: Secondary | ICD-10-CM | POA: Diagnosis not present

## 2024-01-21 DIAGNOSIS — E440 Moderate protein-calorie malnutrition: Secondary | ICD-10-CM | POA: Insufficient documentation

## 2024-01-21 DIAGNOSIS — Z8249 Family history of ischemic heart disease and other diseases of the circulatory system: Secondary | ICD-10-CM

## 2024-01-21 DIAGNOSIS — Z833 Family history of diabetes mellitus: Secondary | ICD-10-CM

## 2024-01-21 DIAGNOSIS — K219 Gastro-esophageal reflux disease without esophagitis: Secondary | ICD-10-CM | POA: Diagnosis not present

## 2024-01-21 DIAGNOSIS — R110 Nausea: Secondary | ICD-10-CM | POA: Diagnosis not present

## 2024-01-21 DIAGNOSIS — I472 Ventricular tachycardia, unspecified: Secondary | ICD-10-CM | POA: Diagnosis not present

## 2024-01-21 DIAGNOSIS — E875 Hyperkalemia: Secondary | ICD-10-CM | POA: Diagnosis present

## 2024-01-21 DIAGNOSIS — R918 Other nonspecific abnormal finding of lung field: Secondary | ICD-10-CM | POA: Diagnosis not present

## 2024-01-21 DIAGNOSIS — K831 Obstruction of bile duct: Secondary | ICD-10-CM | POA: Diagnosis not present

## 2024-01-21 DIAGNOSIS — J80 Acute respiratory distress syndrome: Secondary | ICD-10-CM | POA: Diagnosis not present

## 2024-01-21 DIAGNOSIS — R0602 Shortness of breath: Secondary | ICD-10-CM | POA: Diagnosis not present

## 2024-01-21 DIAGNOSIS — Z7984 Long term (current) use of oral hypoglycemic drugs: Secondary | ICD-10-CM

## 2024-01-21 DIAGNOSIS — Z515 Encounter for palliative care: Secondary | ICD-10-CM

## 2024-01-21 DIAGNOSIS — F419 Anxiety disorder, unspecified: Secondary | ICD-10-CM | POA: Diagnosis not present

## 2024-01-21 DIAGNOSIS — K269 Duodenal ulcer, unspecified as acute or chronic, without hemorrhage or perforation: Secondary | ICD-10-CM | POA: Diagnosis not present

## 2024-01-21 DIAGNOSIS — Z794 Long term (current) use of insulin: Secondary | ICD-10-CM

## 2024-01-21 DIAGNOSIS — R609 Edema, unspecified: Secondary | ICD-10-CM | POA: Diagnosis not present

## 2024-01-21 DIAGNOSIS — R053 Chronic cough: Secondary | ICD-10-CM | POA: Diagnosis not present

## 2024-01-21 DIAGNOSIS — I483 Typical atrial flutter: Secondary | ICD-10-CM | POA: Diagnosis not present

## 2024-01-21 DIAGNOSIS — I2489 Other forms of acute ischemic heart disease: Secondary | ICD-10-CM | POA: Diagnosis not present

## 2024-01-21 DIAGNOSIS — R109 Unspecified abdominal pain: Secondary | ICD-10-CM | POA: Diagnosis not present

## 2024-01-21 DIAGNOSIS — I5043 Acute on chronic combined systolic (congestive) and diastolic (congestive) heart failure: Secondary | ICD-10-CM | POA: Diagnosis not present

## 2024-01-21 DIAGNOSIS — Z01810 Encounter for preprocedural cardiovascular examination: Secondary | ICD-10-CM | POA: Diagnosis not present

## 2024-01-21 DIAGNOSIS — E781 Pure hyperglyceridemia: Secondary | ICD-10-CM | POA: Diagnosis present

## 2024-01-21 DIAGNOSIS — K223 Perforation of esophagus: Secondary | ICD-10-CM | POA: Diagnosis present

## 2024-01-21 DIAGNOSIS — E1165 Type 2 diabetes mellitus with hyperglycemia: Secondary | ICD-10-CM | POA: Diagnosis present

## 2024-01-21 DIAGNOSIS — Z95828 Presence of other vascular implants and grafts: Secondary | ICD-10-CM

## 2024-01-21 DIAGNOSIS — R140 Abdominal distension (gaseous): Secondary | ICD-10-CM | POA: Diagnosis not present

## 2024-01-21 DIAGNOSIS — E1122 Type 2 diabetes mellitus with diabetic chronic kidney disease: Secondary | ICD-10-CM | POA: Diagnosis present

## 2024-01-21 DIAGNOSIS — R52 Pain, unspecified: Secondary | ICD-10-CM | POA: Insufficient documentation

## 2024-01-21 DIAGNOSIS — K209 Esophagitis, unspecified without bleeding: Secondary | ICD-10-CM | POA: Diagnosis not present

## 2024-01-21 DIAGNOSIS — E785 Hyperlipidemia, unspecified: Secondary | ICD-10-CM | POA: Diagnosis not present

## 2024-01-21 DIAGNOSIS — Z4659 Encounter for fitting and adjustment of other gastrointestinal appliance and device: Secondary | ICD-10-CM | POA: Diagnosis not present

## 2024-01-21 DIAGNOSIS — Z604 Social exclusion and rejection: Secondary | ICD-10-CM | POA: Diagnosis present

## 2024-01-21 DIAGNOSIS — I4819 Other persistent atrial fibrillation: Secondary | ICD-10-CM | POA: Diagnosis present

## 2024-01-21 DIAGNOSIS — G4700 Insomnia, unspecified: Secondary | ICD-10-CM | POA: Diagnosis present

## 2024-01-21 DIAGNOSIS — I5023 Acute on chronic systolic (congestive) heart failure: Secondary | ICD-10-CM | POA: Insufficient documentation

## 2024-01-21 DIAGNOSIS — W19XXXA Unspecified fall, initial encounter: Secondary | ICD-10-CM | POA: Diagnosis not present

## 2024-01-21 DIAGNOSIS — I5022 Chronic systolic (congestive) heart failure: Secondary | ICD-10-CM | POA: Diagnosis not present

## 2024-01-21 DIAGNOSIS — I471 Supraventricular tachycardia, unspecified: Secondary | ICD-10-CM | POA: Diagnosis not present

## 2024-01-21 DIAGNOSIS — I700 Atherosclerosis of aorta: Secondary | ICD-10-CM | POA: Diagnosis not present

## 2024-01-21 DIAGNOSIS — Z86718 Personal history of other venous thrombosis and embolism: Secondary | ICD-10-CM | POA: Diagnosis not present

## 2024-01-21 DIAGNOSIS — R1013 Epigastric pain: Secondary | ICD-10-CM | POA: Diagnosis not present

## 2024-01-21 DIAGNOSIS — D62 Acute posthemorrhagic anemia: Secondary | ICD-10-CM | POA: Diagnosis not present

## 2024-01-21 DIAGNOSIS — I952 Hypotension due to drugs: Secondary | ICD-10-CM | POA: Diagnosis not present

## 2024-01-21 DIAGNOSIS — I5021 Acute systolic (congestive) heart failure: Secondary | ICD-10-CM | POA: Diagnosis not present

## 2024-01-21 DIAGNOSIS — Z83438 Family history of other disorder of lipoprotein metabolism and other lipidemia: Secondary | ICD-10-CM

## 2024-01-21 DIAGNOSIS — R0902 Hypoxemia: Secondary | ICD-10-CM | POA: Diagnosis not present

## 2024-01-21 DIAGNOSIS — I517 Cardiomegaly: Secondary | ICD-10-CM | POA: Diagnosis not present

## 2024-01-21 DIAGNOSIS — K529 Noninfective gastroenteritis and colitis, unspecified: Secondary | ICD-10-CM | POA: Diagnosis not present

## 2024-01-21 DIAGNOSIS — L89899 Pressure ulcer of other site, unspecified stage: Secondary | ICD-10-CM | POA: Diagnosis not present

## 2024-01-21 DIAGNOSIS — R739 Hyperglycemia, unspecified: Secondary | ICD-10-CM | POA: Diagnosis not present

## 2024-01-21 DIAGNOSIS — Z9911 Dependence on respirator [ventilator] status: Secondary | ICD-10-CM

## 2024-01-21 DIAGNOSIS — Z7901 Long term (current) use of anticoagulants: Secondary | ICD-10-CM | POA: Diagnosis not present

## 2024-01-21 DIAGNOSIS — E861 Hypovolemia: Secondary | ICD-10-CM | POA: Diagnosis not present

## 2024-01-21 DIAGNOSIS — R112 Nausea with vomiting, unspecified: Secondary | ICD-10-CM | POA: Diagnosis not present

## 2024-01-21 DIAGNOSIS — Z4589 Encounter for adjustment and management of other implanted devices: Secondary | ICD-10-CM | POA: Diagnosis not present

## 2024-01-21 DIAGNOSIS — I7090 Unspecified atherosclerosis: Secondary | ICD-10-CM | POA: Diagnosis not present

## 2024-01-21 DIAGNOSIS — K2970 Gastritis, unspecified, without bleeding: Secondary | ICD-10-CM | POA: Diagnosis present

## 2024-01-21 DIAGNOSIS — Z6820 Body mass index (BMI) 20.0-20.9, adult: Secondary | ICD-10-CM

## 2024-01-21 DIAGNOSIS — E874 Mixed disorder of acid-base balance: Secondary | ICD-10-CM | POA: Diagnosis present

## 2024-01-21 DIAGNOSIS — R64 Cachexia: Secondary | ICD-10-CM | POA: Diagnosis present

## 2024-01-21 DIAGNOSIS — R350 Frequency of micturition: Secondary | ICD-10-CM | POA: Diagnosis present

## 2024-01-21 DIAGNOSIS — L8990 Pressure ulcer of unspecified site, unspecified stage: Secondary | ICD-10-CM | POA: Insufficient documentation

## 2024-01-21 DIAGNOSIS — N4889 Other specified disorders of penis: Secondary | ICD-10-CM | POA: Diagnosis not present

## 2024-01-21 DIAGNOSIS — I110 Hypertensive heart disease with heart failure: Secondary | ICD-10-CM | POA: Diagnosis not present

## 2024-01-21 DIAGNOSIS — I5033 Acute on chronic diastolic (congestive) heart failure: Secondary | ICD-10-CM | POA: Diagnosis not present

## 2024-01-21 DIAGNOSIS — I4892 Unspecified atrial flutter: Secondary | ICD-10-CM | POA: Diagnosis not present

## 2024-01-21 DIAGNOSIS — N186 End stage renal disease: Secondary | ICD-10-CM | POA: Diagnosis present

## 2024-01-21 DIAGNOSIS — K449 Diaphragmatic hernia without obstruction or gangrene: Secondary | ICD-10-CM | POA: Diagnosis not present

## 2024-01-21 DIAGNOSIS — Z931 Gastrostomy status: Secondary | ICD-10-CM | POA: Diagnosis not present

## 2024-01-21 DIAGNOSIS — I129 Hypertensive chronic kidney disease with stage 1 through stage 4 chronic kidney disease, or unspecified chronic kidney disease: Secondary | ICD-10-CM | POA: Diagnosis not present

## 2024-01-21 DIAGNOSIS — K2980 Duodenitis without bleeding: Secondary | ICD-10-CM | POA: Diagnosis not present

## 2024-01-21 DIAGNOSIS — J982 Interstitial emphysema: Secondary | ICD-10-CM | POA: Diagnosis present

## 2024-01-21 DIAGNOSIS — Y95 Nosocomial condition: Secondary | ICD-10-CM | POA: Diagnosis not present

## 2024-01-21 DIAGNOSIS — N189 Chronic kidney disease, unspecified: Secondary | ICD-10-CM | POA: Diagnosis not present

## 2024-01-21 DIAGNOSIS — Z9842 Cataract extraction status, left eye: Secondary | ICD-10-CM

## 2024-01-21 DIAGNOSIS — J9600 Acute respiratory failure, unspecified whether with hypoxia or hypercapnia: Secondary | ICD-10-CM | POA: Diagnosis not present

## 2024-01-21 DIAGNOSIS — R079 Chest pain, unspecified: Secondary | ICD-10-CM | POA: Diagnosis not present

## 2024-01-21 DIAGNOSIS — J810 Acute pulmonary edema: Secondary | ICD-10-CM | POA: Diagnosis not present

## 2024-01-21 DIAGNOSIS — R188 Other ascites: Secondary | ICD-10-CM | POA: Diagnosis not present

## 2024-01-21 DIAGNOSIS — R1110 Vomiting, unspecified: Secondary | ICD-10-CM | POA: Diagnosis not present

## 2024-01-21 DIAGNOSIS — R32 Unspecified urinary incontinence: Secondary | ICD-10-CM | POA: Diagnosis present

## 2024-01-21 DIAGNOSIS — I480 Paroxysmal atrial fibrillation: Secondary | ICD-10-CM | POA: Diagnosis not present

## 2024-01-21 DIAGNOSIS — I2582 Chronic total occlusion of coronary artery: Secondary | ICD-10-CM | POA: Diagnosis present

## 2024-01-21 DIAGNOSIS — J15212 Pneumonia due to Methicillin resistant Staphylococcus aureus: Secondary | ICD-10-CM | POA: Diagnosis not present

## 2024-01-21 DIAGNOSIS — R042 Hemoptysis: Secondary | ICD-10-CM | POA: Diagnosis not present

## 2024-01-21 DIAGNOSIS — K429 Umbilical hernia without obstruction or gangrene: Secondary | ICD-10-CM | POA: Diagnosis not present

## 2024-01-21 DIAGNOSIS — J95851 Ventilator associated pneumonia: Secondary | ICD-10-CM | POA: Diagnosis not present

## 2024-01-21 DIAGNOSIS — Z8674 Personal history of sudden cardiac arrest: Secondary | ICD-10-CM

## 2024-01-21 DIAGNOSIS — I130 Hypertensive heart and chronic kidney disease with heart failure and stage 1 through stage 4 chronic kidney disease, or unspecified chronic kidney disease: Secondary | ICD-10-CM | POA: Diagnosis not present

## 2024-01-21 DIAGNOSIS — I493 Ventricular premature depolarization: Secondary | ICD-10-CM | POA: Diagnosis not present

## 2024-01-21 DIAGNOSIS — I509 Heart failure, unspecified: Secondary | ICD-10-CM | POA: Diagnosis not present

## 2024-01-21 DIAGNOSIS — K264 Chronic or unspecified duodenal ulcer with hemorrhage: Secondary | ICD-10-CM | POA: Diagnosis present

## 2024-01-21 DIAGNOSIS — Z86711 Personal history of pulmonary embolism: Secondary | ICD-10-CM | POA: Diagnosis not present

## 2024-01-21 DIAGNOSIS — N179 Acute kidney failure, unspecified: Secondary | ICD-10-CM | POA: Diagnosis not present

## 2024-01-21 DIAGNOSIS — R935 Abnormal findings on diagnostic imaging of other abdominal regions, including retroperitoneum: Secondary | ICD-10-CM | POA: Diagnosis not present

## 2024-01-21 DIAGNOSIS — Z0389 Encounter for observation for other suspected diseases and conditions ruled out: Secondary | ICD-10-CM | POA: Diagnosis not present

## 2024-01-21 DIAGNOSIS — T502X5A Adverse effect of carbonic-anhydrase inhibitors, benzothiadiazides and other diuretics, initial encounter: Secondary | ICD-10-CM | POA: Diagnosis not present

## 2024-01-21 DIAGNOSIS — R091 Pleurisy: Secondary | ICD-10-CM | POA: Diagnosis not present

## 2024-01-21 DIAGNOSIS — Z8673 Personal history of transient ischemic attack (TIA), and cerebral infarction without residual deficits: Secondary | ICD-10-CM

## 2024-01-21 DIAGNOSIS — M546 Pain in thoracic spine: Secondary | ICD-10-CM | POA: Diagnosis not present

## 2024-01-21 DIAGNOSIS — I160 Hypertensive urgency: Secondary | ICD-10-CM | POA: Diagnosis not present

## 2024-01-21 DIAGNOSIS — L89311 Pressure ulcer of right buttock, stage 1: Secondary | ICD-10-CM | POA: Diagnosis not present

## 2024-01-21 DIAGNOSIS — K5900 Constipation, unspecified: Secondary | ICD-10-CM | POA: Diagnosis not present

## 2024-01-21 DIAGNOSIS — Z961 Presence of intraocular lens: Secondary | ICD-10-CM | POA: Diagnosis present

## 2024-01-21 DIAGNOSIS — Z888 Allergy status to other drugs, medicaments and biological substances status: Secondary | ICD-10-CM

## 2024-01-21 DIAGNOSIS — I4729 Other ventricular tachycardia: Secondary | ICD-10-CM | POA: Diagnosis not present

## 2024-01-21 DIAGNOSIS — Y838 Other surgical procedures as the cause of abnormal reaction of the patient, or of later complication, without mention of misadventure at the time of the procedure: Secondary | ICD-10-CM | POA: Diagnosis present

## 2024-01-21 DIAGNOSIS — Z4682 Encounter for fitting and adjustment of non-vascular catheter: Secondary | ICD-10-CM | POA: Diagnosis not present

## 2024-01-21 DIAGNOSIS — E1142 Type 2 diabetes mellitus with diabetic polyneuropathy: Secondary | ICD-10-CM | POA: Diagnosis present

## 2024-01-21 DIAGNOSIS — E8809 Other disorders of plasma-protein metabolism, not elsewhere classified: Secondary | ICD-10-CM | POA: Diagnosis present

## 2024-01-21 DIAGNOSIS — R1084 Generalized abdominal pain: Secondary | ICD-10-CM | POA: Diagnosis not present

## 2024-01-21 DIAGNOSIS — I4891 Unspecified atrial fibrillation: Secondary | ICD-10-CM | POA: Diagnosis not present

## 2024-01-21 DIAGNOSIS — E878 Other disorders of electrolyte and fluid balance, not elsewhere classified: Secondary | ICD-10-CM | POA: Diagnosis present

## 2024-01-21 DIAGNOSIS — Z0189 Encounter for other specified special examinations: Secondary | ICD-10-CM | POA: Diagnosis not present

## 2024-01-21 DIAGNOSIS — Z59819 Housing instability, housed unspecified: Secondary | ICD-10-CM

## 2024-01-21 DIAGNOSIS — J9620 Acute and chronic respiratory failure, unspecified whether with hypoxia or hypercapnia: Secondary | ICD-10-CM | POA: Diagnosis not present

## 2024-01-21 DIAGNOSIS — G9341 Metabolic encephalopathy: Secondary | ICD-10-CM | POA: Diagnosis not present

## 2024-01-21 DIAGNOSIS — K7200 Acute and subacute hepatic failure without coma: Secondary | ICD-10-CM | POA: Diagnosis not present

## 2024-01-21 DIAGNOSIS — T85528A Displacement of other gastrointestinal prosthetic devices, implants and grafts, initial encounter: Secondary | ICD-10-CM | POA: Diagnosis not present

## 2024-01-21 DIAGNOSIS — E871 Hypo-osmolality and hyponatremia: Secondary | ICD-10-CM | POA: Diagnosis present

## 2024-01-21 DIAGNOSIS — I484 Atypical atrial flutter: Secondary | ICD-10-CM | POA: Diagnosis not present

## 2024-01-21 DIAGNOSIS — Z59868 Other specified financial insecurity: Secondary | ICD-10-CM

## 2024-01-21 DIAGNOSIS — J189 Pneumonia, unspecified organism: Secondary | ICD-10-CM | POA: Diagnosis not present

## 2024-01-21 DIAGNOSIS — N183 Chronic kidney disease, stage 3 unspecified: Secondary | ICD-10-CM | POA: Diagnosis not present

## 2024-01-21 DIAGNOSIS — K30 Functional dyspepsia: Secondary | ICD-10-CM | POA: Diagnosis not present

## 2024-01-21 DIAGNOSIS — R933 Abnormal findings on diagnostic imaging of other parts of digestive tract: Secondary | ICD-10-CM | POA: Diagnosis not present

## 2024-01-21 DIAGNOSIS — J690 Pneumonitis due to inhalation of food and vomit: Secondary | ICD-10-CM | POA: Diagnosis not present

## 2024-01-21 DIAGNOSIS — T4275XA Adverse effect of unspecified antiepileptic and sedative-hypnotic drugs, initial encounter: Secondary | ICD-10-CM | POA: Diagnosis not present

## 2024-01-21 DIAGNOSIS — Z79899 Other long term (current) drug therapy: Secondary | ICD-10-CM

## 2024-01-21 DIAGNOSIS — Z452 Encounter for adjustment and management of vascular access device: Secondary | ICD-10-CM | POA: Diagnosis not present

## 2024-01-21 DIAGNOSIS — S70312A Abrasion, left thigh, initial encounter: Secondary | ICD-10-CM | POA: Diagnosis not present

## 2024-01-21 DIAGNOSIS — J9691 Respiratory failure, unspecified with hypoxia: Secondary | ICD-10-CM | POA: Diagnosis not present

## 2024-01-21 DIAGNOSIS — N1832 Chronic kidney disease, stage 3b: Secondary | ICD-10-CM | POA: Diagnosis not present

## 2024-01-21 DIAGNOSIS — L89891 Pressure ulcer of other site, stage 1: Secondary | ICD-10-CM | POA: Diagnosis not present

## 2024-01-21 DIAGNOSIS — Z9889 Other specified postprocedural states: Secondary | ICD-10-CM | POA: Diagnosis not present

## 2024-01-21 DIAGNOSIS — E46 Unspecified protein-calorie malnutrition: Secondary | ICD-10-CM | POA: Diagnosis not present

## 2024-01-21 DIAGNOSIS — R54 Age-related physical debility: Secondary | ICD-10-CM | POA: Diagnosis present

## 2024-01-21 DIAGNOSIS — D72829 Elevated white blood cell count, unspecified: Secondary | ICD-10-CM | POA: Diagnosis not present

## 2024-01-21 DIAGNOSIS — J9602 Acute respiratory failure with hypercapnia: Secondary | ICD-10-CM | POA: Diagnosis not present

## 2024-01-21 LAB — BASIC METABOLIC PANEL WITH GFR
Anion gap: 12 (ref 5–15)
BUN: 49 mg/dL — ABNORMAL HIGH (ref 8–23)
CO2: 23 mmol/L (ref 22–32)
Calcium: 8.2 mg/dL — ABNORMAL LOW (ref 8.9–10.3)
Chloride: 104 mmol/L (ref 98–111)
Creatinine, Ser: 2.15 mg/dL — ABNORMAL HIGH (ref 0.61–1.24)
GFR, Estimated: 32 mL/min — ABNORMAL LOW (ref >60)
Glucose, Bld: 210 mg/dL — ABNORMAL HIGH (ref 70–99)
Potassium: 3.8 mmol/L (ref 3.5–5.1)
Sodium: 139 mmol/L (ref 135–145)

## 2024-01-21 LAB — CBC WITH DIFFERENTIAL/PLATELET
Abs Immature Granulocytes: 0.04 K/uL (ref 0.00–0.07)
Basophils Absolute: 0 K/uL (ref 0.0–0.1)
Basophils Relative: 0 %
Eosinophils Absolute: 0.1 K/uL (ref 0.0–0.5)
Eosinophils Relative: 1 %
HCT: 47.9 % (ref 39.0–52.0)
Hemoglobin: 14.8 g/dL (ref 13.0–17.0)
Immature Granulocytes: 0 %
Lymphocytes Relative: 8 %
Lymphs Abs: 0.7 K/uL (ref 0.7–4.0)
MCH: 27.8 pg (ref 26.0–34.0)
MCHC: 30.9 g/dL (ref 30.0–36.0)
MCV: 89.9 fL (ref 80.0–100.0)
Monocytes Absolute: 0.6 K/uL (ref 0.1–1.0)
Monocytes Relative: 6 %
Neutro Abs: 8.1 K/uL — ABNORMAL HIGH (ref 1.7–7.7)
Neutrophils Relative %: 85 %
Platelets: 208 K/uL (ref 150–400)
RBC: 5.33 MIL/uL (ref 4.22–5.81)
RDW: 18 % — ABNORMAL HIGH (ref 11.5–15.5)
WBC: 9.5 K/uL (ref 4.0–10.5)
nRBC: 0 % (ref 0.0–0.2)

## 2024-01-21 LAB — COMPREHENSIVE METABOLIC PANEL WITH GFR
ALT: 21 U/L (ref 0–44)
AST: 21 U/L (ref 15–41)
Albumin: 2 g/dL — ABNORMAL LOW (ref 3.5–5.0)
Alkaline Phosphatase: 33 U/L — ABNORMAL LOW (ref 38–126)
Anion gap: 8 (ref 5–15)
BUN: 37 mg/dL — ABNORMAL HIGH (ref 8–23)
CO2: 17 mmol/L — ABNORMAL LOW (ref 22–32)
Calcium: 5.5 mg/dL — CL (ref 8.9–10.3)
Chloride: 118 mmol/L — ABNORMAL HIGH (ref 98–111)
Creatinine, Ser: 1.43 mg/dL — ABNORMAL HIGH (ref 0.61–1.24)
GFR, Estimated: 52 mL/min — ABNORMAL LOW (ref >60)
Glucose, Bld: 165 mg/dL — ABNORMAL HIGH (ref 70–99)
Potassium: 2.8 mmol/L — ABNORMAL LOW (ref 3.5–5.1)
Sodium: 143 mmol/L (ref 135–145)
Total Bilirubin: 0.5 mg/dL (ref 0.0–1.2)
Total Protein: 3.8 g/dL — ABNORMAL LOW (ref 6.5–8.1)

## 2024-01-21 LAB — TYPE AND SCREEN
ABO/RH(D): A POS
Antibody Screen: NEGATIVE

## 2024-01-21 LAB — I-STAT CHEM 8, ED
BUN: 52 mg/dL — ABNORMAL HIGH (ref 8–23)
Calcium, Ion: 1.14 mmol/L — ABNORMAL LOW (ref 1.15–1.40)
Chloride: 107 mmol/L (ref 98–111)
Creatinine, Ser: 2.3 mg/dL — ABNORMAL HIGH (ref 0.61–1.24)
Glucose, Bld: 223 mg/dL — ABNORMAL HIGH (ref 70–99)
HCT: 45 % (ref 39.0–52.0)
Hemoglobin: 15.3 g/dL (ref 13.0–17.0)
Potassium: 4.3 mmol/L (ref 3.5–5.1)
Sodium: 140 mmol/L (ref 135–145)
TCO2: 23 mmol/L (ref 22–32)

## 2024-01-21 LAB — PROTIME-INR
INR: 1.3 — ABNORMAL HIGH (ref 0.8–1.2)
Prothrombin Time: 16.7 s — ABNORMAL HIGH (ref 11.4–15.2)

## 2024-01-21 LAB — MRSA NEXT GEN BY PCR, NASAL: MRSA by PCR Next Gen: DETECTED — AB

## 2024-01-21 LAB — LIPASE, BLOOD: Lipase: 55 U/L — ABNORMAL HIGH (ref 11–51)

## 2024-01-21 LAB — GLUCOSE, CAPILLARY: Glucose-Capillary: 113 mg/dL — ABNORMAL HIGH (ref 70–99)

## 2024-01-21 LAB — TROPONIN I (HIGH SENSITIVITY)
Troponin I (High Sensitivity): 20 ng/L — ABNORMAL HIGH (ref <18)
Troponin I (High Sensitivity): 13 ng/L (ref <18)

## 2024-01-21 MED ORDER — SODIUM CHLORIDE 0.9 % IV SOLN: INTRAVENOUS | Status: DC

## 2024-01-21 MED ORDER — LACTATED RINGERS IV SOLN: INTRAVENOUS | Status: AC

## 2024-01-21 MED ORDER — SODIUM CHLORIDE 0.9% FLUSH: 9.0000 mL | INTRAVENOUS | Status: DC | PRN

## 2024-01-21 MED ORDER — PROTHROMBIN COMPLEX CONC HUMAN 500 UNITS IV KIT
3432.0000 [IU] | PACK | Status: AC
  Administered 2024-01-21: 3432 [IU] via INTRAVENOUS
  Filled 2024-01-21: qty 3432

## 2024-01-21 MED ORDER — CHLORHEXIDINE GLUCONATE CLOTH 2 % EX PADS
6.0000 | MEDICATED_PAD | Freq: Every day | CUTANEOUS | Status: DC
  Administered 2024-01-22 – 2024-04-29 (×98): 6 via TOPICAL

## 2024-01-21 MED ORDER — DIPHENHYDRAMINE HCL 50 MG/ML IJ SOLN: 12.5000 mg | Freq: Four times a day (QID) | INTRAMUSCULAR | Status: DC | PRN

## 2024-01-21 MED ORDER — DOCUSATE SODIUM 100 MG PO CAPS: 100.0000 mg | ORAL_CAPSULE | Freq: Two times a day (BID) | ORAL | Status: DC | PRN

## 2024-01-21 MED ORDER — SODIUM CHLORIDE 0.9 % IV BOLUS
1000.0000 mL | Freq: Once | INTRAVENOUS | Status: AC
  Administered 2024-01-21: 1000 mL via INTRAVENOUS

## 2024-01-21 MED ORDER — MORPHINE SULFATE (PF) 4 MG/ML IV SOLN
6.0000 mg | Freq: Once | INTRAVENOUS | Status: AC
  Administered 2024-01-21: 6 mg via INTRAVENOUS
  Filled 2024-01-21: qty 2

## 2024-01-21 MED ORDER — DIPHENHYDRAMINE HCL 12.5 MG/5ML PO ELIX: 12.5000 mg | ORAL_SOLUTION | Freq: Four times a day (QID) | ORAL | Status: DC | PRN

## 2024-01-21 MED ORDER — ONDANSETRON HCL 4 MG/2ML IJ SOLN
4.0000 mg | Freq: Once | INTRAMUSCULAR | Status: AC
  Administered 2024-01-21: 4 mg via INTRAVENOUS
  Filled 2024-01-21: qty 2

## 2024-01-21 MED ORDER — PIPERACILLIN-TAZOBACTAM 3.375 G IVPB 30 MIN
3.3750 g | Freq: Once | INTRAVENOUS | Status: AC
  Administered 2024-01-21: 3.375 g via INTRAVENOUS
  Filled 2024-01-21: qty 50

## 2024-01-21 MED ORDER — PANTOPRAZOLE SODIUM 40 MG IV SOLR
40.0000 mg | Freq: Every day | INTRAVENOUS | Status: DC
  Administered 2024-01-21 – 2024-02-05 (×16): 40 mg via INTRAVENOUS
  Filled 2024-01-21 (×16): qty 10

## 2024-01-21 MED ORDER — MORPHINE SULFATE (PF) 4 MG/ML IV SOLN
4.0000 mg | Freq: Once | INTRAVENOUS | Status: AC
  Administered 2024-01-21: 4 mg via INTRAVENOUS
  Filled 2024-01-21: qty 1

## 2024-01-21 MED ORDER — DIPHENHYDRAMINE HCL 50 MG/ML IJ SOLN
12.5000 mg | Freq: Once | INTRAMUSCULAR | Status: AC
  Administered 2024-01-21: 12.5 mg via INTRAVENOUS
  Filled 2024-01-21: qty 1

## 2024-01-21 MED ORDER — HYDROMORPHONE HCL 1 MG/ML IJ SOLN
1.0000 mg | INTRAMUSCULAR | Status: AC | PRN
  Administered 2024-01-21: 1 mg via INTRAVENOUS
  Filled 2024-01-21: qty 1

## 2024-01-21 MED ORDER — ONDANSETRON HCL 4 MG/2ML IJ SOLN: 4.0000 mg | Freq: Four times a day (QID) | INTRAMUSCULAR | Status: DC | PRN

## 2024-01-21 MED ORDER — POLYETHYLENE GLYCOL 3350 17 G PO PACK: 17.0000 g | PACK | Freq: Every day | ORAL | Status: DC | PRN

## 2024-01-21 MED ORDER — HYDROMORPHONE 1 MG/ML IV SOLN
INTRAVENOUS | Status: DC
  Administered 2024-01-21: 30 mg via INTRAVENOUS
  Administered 2024-01-22: 0.3 mg via INTRAVENOUS
  Filled 2024-01-21: qty 30

## 2024-01-21 MED ORDER — IOHEXOL 350 MG/ML SOLN
75.0000 mL | Freq: Once | INTRAVENOUS | Status: AC | PRN
  Administered 2024-01-21: 75 mL via ORAL

## 2024-01-21 MED ORDER — HYDROMORPHONE HCL 1 MG/ML IJ SOLN
1.0000 mg | INTRAMUSCULAR | Status: DC | PRN
  Administered 2024-01-21: 1 mg via INTRAVENOUS
  Filled 2024-01-21: qty 1

## 2024-01-21 MED ORDER — HYDROMORPHONE HCL 1 MG/ML IJ SOLN
1.0000 mg | Freq: Once | INTRAMUSCULAR | Status: AC
  Administered 2024-01-21: 1 mg via INTRAVENOUS
  Filled 2024-01-21: qty 1

## 2024-01-21 MED ORDER — IOHEXOL 350 MG/ML SOLN
100.0000 mL | Freq: Once | INTRAVENOUS | Status: AC | PRN
Start: 2024-01-21 — End: 2024-01-21
  Administered 2024-01-21: 100 mL via INTRAVENOUS

## 2024-01-21 MED ORDER — NALOXONE HCL 0.4 MG/ML IJ SOLN: 0.4000 mg | INTRAMUSCULAR | Status: DC | PRN

## 2024-01-21 MED ORDER — PIPERACILLIN-TAZOBACTAM 3.375 G IVPB
3.3750 g | Freq: Three times a day (TID) | INTRAVENOUS | Status: DC
  Administered 2024-01-21 – 2024-01-26 (×14): 3.375 g via INTRAVENOUS
  Filled 2024-01-21 (×15): qty 50

## 2024-01-21 MED ORDER — ACETAMINOPHEN 10 MG/ML IV SOLN
1000.0000 mg | Freq: Four times a day (QID) | INTRAVENOUS | Status: AC
  Administered 2024-01-21 – 2024-01-22 (×3): 1000 mg via INTRAVENOUS
  Filled 2024-01-21 (×3): qty 100
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
## 2024-01-21 NOTE — ED Notes (Signed)
 Pt's son [name] updated on care plan.

## 2024-01-21 NOTE — ED Provider Notes (Signed)
 Patient taken at shift handoff from [facility].  Patient here with complaint of sudden onset severe epigastric abdominal pain.  Per previous team patient seemed to be very uncomfortable they had concern for potential aortic dissection versus perforated gastric ulcer.  Clinical Course as of 01/22/24 1116  Sun Jan 21, 2024  1602 Patient has received IV Dilaudid  and on my examination patient remains with a rigid abdomen.  Have reordered pain medications and Zosyn  to cover for intra-abdominal infection.Patient still needs a documented temperature.  Will follow-up with his care team as soon as possible. [AH]  1610 Troponin I (High Sensitivity)(!): 20 [AH]  1659 Comprehensive metabolic panel with GFR(!!) CMP is very abnormal concerned that there may be dilution of the sample will rerun BMP. [AH]  1701 CT Angio Chest/Abd/Pel for Dissection W and/or W/WO I visualized and interpreted patient's CT angiogram dissection study.  It appears that he has pneumomediastinum with air tracking up into the neck spaces.  There is also a large poorly demarcated masslike region in the mediastinum concerning for potential hematoma there is air in this region as well patient is on Eliquis .  I have placed a call to CT surgery for further evaluation. [AH]  1703 Case discussed with Dr. Waddell of radiology service due to critical results.  He states that she has suspicion for probably mixed hematoma with abscess or infection in the mediastinum. [AH]  1711 Case discussed with Dr.Hendrickson who recommends ct esophagram and Eliquis  reversal  [AH]  1950 Discussion with Dr. Kerrin after esophagram findings. Concern for potential mass. He feels pt needs EGD and barium swallow study- asks for ICU admission. He will follow patient course [AH]  1958 DG ESOPHAGUS W SINGLE CM (SOL OR THIN BA) [AH]  2007 Case discussed with Dr. Rolan Sharps who will admit the patient to the ICU [AH]  2014 I spoke with Dr. Dianna, will plan for EGD  tomorrow with Dr. Saintclair  [AH]    Clinical Course User Index [AH] Annalisa Colonna, PA-C    .Critical Care  Performed by: Arloa Chroman, PA-C Authorized by: Arloa Chroman, PA-C   Critical care provider statement:    Critical care time (minutes):  80   Critical care time was exclusive of:  Separately billable procedures and treating other patients   Critical care was necessary to treat or prevent imminent or life-threatening deterioration of the following conditions: ruptured esohpagus.   Critical care was time spent personally by me on the following activities:  Development of treatment plan with patient or surrogate, discussions with consultants, evaluation of patient's response to treatment, examination of patient, ordering and review of laboratory studies, ordering and review of radiographic studies, ordering and performing treatments and interventions, pulse oximetry, re-evaluation of patient's condition, review of old charts, interpretation of cardiac output measurements and obtaining history from patient or surrogate      Arloa Chroman, PA-C 01/22/24 1116    Dean Clarity, MD 01/24/24 [phone]

## 2024-01-21 NOTE — ED Triage Notes (Signed)
 Patient present to the ER via EMS from home. Patient c/o abdominal pain and N/V for 2-3 days. Patient was evaluated at an urgent care with no diagnosis. Patient was unsure if her saw blood in his emesis today. Patient denies fever or diarrhea.

## 2024-01-21 NOTE — Progress Notes (Addendum)
 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  Vitals showed tachypnea, tachycardia, hypertension.  Saturating 97% on room air.  Results consistent with elevated creatinine, hyperglycemia.  CT results reviewed.  eICU Interventions  Pending EGD and barium swallow for better evaluation of perforation.  Empiric antibiotics-Zosyn   Strict NPO  DVT prophylaxis with Eliquis , SCDs GI prophylaxis not technically indicated   0137 - BP on the lower side, start with 1 L LR bolus given limited resuscitation.  If ineffective, initiate norepinephrine   Intervention Category Evaluation Type: New Patient Evaluation  Billy [name] 01/21/2024, 9:55 PM

## 2024-01-21 NOTE — ED Provider Notes (Signed)
 [place] [facility] Provider Note   CSN: [id number] Arrival date & time: 01/21/24  1334     Patient presents with: Abdominal Pain and Emesis  HPI Billy [name] is a 73 y.o. male with CAD, NSTEMI, CKD, paroxysmal A-fib, stroke, presenting for abdominal pain.  Started 2 to 3 days ago with nausea and vomiting.  Pain is in the upper abdomen and bandlike fashion.  He denies chest pain or shortness of breath.  Had a normal bowel movement a couple days ago.  Denies fever as well.  Denies excessive alcohol NSAID or Tylenol  use.    Abdominal Pain Associated symptoms: vomiting   Emesis Associated symptoms: abdominal pain        Prior to Admission medications   Medication Sig Start Date End Date Taking? Authorizing Provider  acetaminophen  (TYLENOL ) 325 MG tablet Take 2 tablets (650 mg total) by mouth every 4 (four) hours as needed for headache or mild pain (pain score 1-3). 07/20/23   Hongalgi, Anand D, MD  amiodarone  (PACERONE ) 200 MG tablet Take 1 tablet (200 mg total) by mouth daily. 09/22/23   Court Dorn PARAS, MD  apixaban  (ELIQUIS ) 5 MG TABS tablet Take 1 tablet (5 mg total) by mouth 2 (two) times daily. 09/27/23   Court Dorn PARAS, MD  atorvastatin  (LIPITOR ) 80 MG tablet Take 1 tablet (80 mg total) by mouth daily. 07/20/23   Hongalgi, Anand D, MD  ezetimibe  (ZETIA ) 10 MG tablet Take 1 tablet (10 mg total) by mouth daily. Patient taking differently: Take 10 mg by mouth daily with supper. 09/01/22   Lanis Fonda BRAVO, MD  ferrous sulfate  325 (65 FE) MG tablet Take 325 mg by mouth every morning. Patient not taking: Reported on 12/19/2023 09/06/23   [provider]  Multiple Vitamin (MULTIVITAMIN WITH MINERALS) TABS tablet Take 1 tablet by mouth daily. 07/21/23   Hongalgi, Anand D, MD  pantoprazole  (PROTONIX ) 40 MG tablet Take 1 tablet (40 mg total) by mouth 2 (two) times daily before a meal. Patient not taking: Reported on 12/19/2023 07/20/23 09/27/23  Hongalgi,  Anand D, MD  sacubitril -valsartan  (ENTRESTO ) 97-103 MG Take 1 tablet by mouth 2 (two) times daily. 10/10/23   Wyn Jackee VEAR Mickey., NP  torsemide  (DEMADEX ) 20 MG tablet Take 1 tablet (20 mg total) by mouth daily. 10/31/23   Wyn Jackee VEAR Mickey., NP    Allergies: Amlodipine , Jardiance  [empagliflozin ], and Morphine and codeine    Review of Systems  Gastrointestinal:  Positive for abdominal pain and vomiting.    Updated Vital Signs BP (!) 141/63   Pulse 73   Resp (!) 23   Ht 6' 1 (1.854 m)   Wt 72.6 kg   SpO2 100%   BMI 21.11 kg/m   Physical Exam Vitals and nursing note reviewed.  Constitutional:      Comments: Diaphoretic.  Standing and pacing due to pain  HENT:     Head: Normocephalic and atraumatic.     Mouth/Throat:     Mouth: Mucous membranes are moist.  Eyes:     General:        Right eye: No discharge.        Left eye: No discharge.     Conjunctiva/sclera: Conjunctivae normal.  Cardiovascular:     Rate and Rhythm: Normal rate and regular rhythm.     Pulses: Normal pulses.     Heart sounds: Normal heart sounds.  Pulmonary:     Effort: Pulmonary effort is normal. Tachypnea  present.     Breath sounds: Normal breath sounds.  Abdominal:     General: Abdomen is flat.     Palpations: Abdomen is soft.     Tenderness: There is abdominal tenderness in the epigastric area.  Skin:    General: Skin is warm and dry.  Neurological:     General: No focal deficit present.  Psychiatric:        Mood and Affect: Mood normal.     (all labs ordered are listed, but only abnormal results are displayed) Labs Reviewed  CBC WITH DIFFERENTIAL/PLATELET - Abnormal; Notable for the following components:      Result Value   RDW 18.0 (*)    Neutro Abs 8.1 (*)    All other components within normal limits  I-STAT CHEM 8, ED - Abnormal; Notable for the following components:   BUN 52 (*)    Creatinine, Ser 2.30 (*)    Glucose, Bld 223 (*)    Calcium , Ion 1.14 (*)    All other components  within normal limits  TROPONIN I (HIGH SENSITIVITY) - Abnormal; Notable for the following components:   Troponin I (High Sensitivity) 20 (*)    All other components within normal limits  COMPREHENSIVE METABOLIC PANEL WITH GFR  LIPASE, BLOOD  TROPONIN I (HIGH SENSITIVITY)    EKG: EKG Interpretation Date/Time:  Sunday January 21 2024 15:12:19 EDT Ventricular Rate:  75 PR Interval:  188 QRS Duration:  119 QT Interval:  397 QTC Calculation: 444 R Axis:   71  Text Interpretation: Sinus rhythm Nonspecific intraventricular conduction delay Borderline repolarization abnormality ST elevation, consider anterior injury No significant change since last tracing Confirmed by Dean Clarity [phone]) on 01/21/2024 4:04:24 PM  Radiology: No results found.   Procedures   Medications Ordered in the ED  piperacillin -tazobactam (ZOSYN ) IVPB 3.375 g (3.375 g Intravenous New Bag/Given 01/21/24 1618)    Followed by  piperacillin -tazobactam (ZOSYN ) IVPB 3.375 g (has no administration in time range)  HYDROmorphone  (DILAUDID ) injection 1 mg (1 mg Intravenous Given 01/21/24 1618)  sodium chloride  0.9 % bolus 1,000 mL (1,000 mLs Intravenous New Bag/Given 01/21/24 1459)  ondansetron  (ZOFRAN ) injection 4 mg (4 mg Intravenous Given 01/21/24 1500)  HYDROmorphone  (DILAUDID ) injection 1 mg (1 mg Intravenous Given 01/21/24 1500)  iohexol  (OMNIPAQUE ) 350 MG/ML injection 100 mL (100 mLs Intravenous Contrast Given 01/21/24 1610)    Clinical Course as of 01/21/24 1619  Sun Jan 21, 2024  1610 Troponin I (High Sensitivity)(!): 20 [AH]    Clinical Course User Index [AH] Arloa Chroman, PA-C                                 Medical Decision Making Amount and/or Complexity of Data Reviewed Labs: ordered. Radiology: ordered.  Risk Prescription drug management.   Initial Impression and Ddx 73 yo well appearing male in distress d/t his epigastric pain presenting for abdominal pain. Exam notable for diaphoresis,  pale, epigastric tenderness. Ddx includes aortic dissection, acute pancreatitis, GB pathology, kidneystone, PE, ACS, other Patient PMH that increases complexity of ED encounter:   CAD, NSTEMI, CKD, paroxysmal A-fib, stroke  Interpretation of Diagnostics - I independent reviewed and interpreted the labs as followed: elevated trop 20  - CT angio chest/ab/pel is pending  - I personally reviewed an interpreted EKG which revealed sinus rhythm  Patient Reassessment and Ultimate Disposition/Management Signed to PA Chroman Arloa. Dispo based on CT scan results and reassessment  after treatment.   Patient management required discussion with the following services or consulting groups:  None  Complexity of Problems Addressed Acute complicated illness or Injury  Additional Data Reviewed and Analyzed Further history obtained from: Past medical history and medications listed in the EMR and Prior ED visit notes  Patient Encounter Risk Assessment Consideration of hospitalization      Final diagnoses:  Abdominal pain, unspecified abdominal location    ED Discharge Orders     None          Lang Norleen POUR, PA-C 01/21/24 1620    Doretha Folks, MD 01/24/24 1639

## 2024-01-21 NOTE — ED Triage Notes (Addendum)
Charted wrong chart
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
## 2024-01-21 NOTE — ED Notes (Signed)
 Patient transported to and from CT with this Clinical research associate attending.

## 2024-01-22 ENCOUNTER — Inpatient Hospital Stay (HOSPITAL_COMMUNITY): Admitting: Certified Registered Nurse Anesthetist

## 2024-01-22 ENCOUNTER — Encounter (HOSPITAL_COMMUNITY)
Admission: EM | Disposition: A | Discharge status: Skilled Nursing Facility | Payer: Self-pay | Source: Home / Self Care | Attending: Family Medicine

## 2024-01-22 ENCOUNTER — Inpatient Hospital Stay (HOSPITAL_COMMUNITY)

## 2024-01-22 ENCOUNTER — Other Ambulatory Visit: Payer: Self-pay

## 2024-01-22 ENCOUNTER — Encounter (HOSPITAL_COMMUNITY): Payer: Self-pay | Admitting: Internal Medicine

## 2024-01-22 DIAGNOSIS — N179 Acute kidney failure, unspecified: Secondary | ICD-10-CM | POA: Diagnosis not present

## 2024-01-22 DIAGNOSIS — Z9889 Other specified postprocedural states: Secondary | ICD-10-CM

## 2024-01-22 DIAGNOSIS — Z982 Presence of cerebrospinal fluid drainage device: Secondary | ICD-10-CM

## 2024-01-22 DIAGNOSIS — J90 Pleural effusion, not elsewhere classified: Secondary | ICD-10-CM

## 2024-01-22 DIAGNOSIS — Z01810 Encounter for preprocedural cardiovascular examination: Secondary | ICD-10-CM

## 2024-01-22 DIAGNOSIS — I130 Hypertensive heart and chronic kidney disease with heart failure and stage 1 through stage 4 chronic kidney disease, or unspecified chronic kidney disease: Secondary | ICD-10-CM | POA: Diagnosis not present

## 2024-01-22 DIAGNOSIS — E440 Moderate protein-calorie malnutrition: Secondary | ICD-10-CM

## 2024-01-22 DIAGNOSIS — N1832 Chronic kidney disease, stage 3b: Secondary | ICD-10-CM

## 2024-01-22 DIAGNOSIS — K223 Perforation of esophagus: Secondary | ICD-10-CM | POA: Diagnosis not present

## 2024-01-22 DIAGNOSIS — I5032 Chronic diastolic (congestive) heart failure: Secondary | ICD-10-CM

## 2024-01-22 DIAGNOSIS — I480 Paroxysmal atrial fibrillation: Secondary | ICD-10-CM | POA: Diagnosis not present

## 2024-01-22 DIAGNOSIS — I952 Hypotension due to drugs: Secondary | ICD-10-CM

## 2024-01-22 DIAGNOSIS — I5033 Acute on chronic diastolic (congestive) heart failure: Secondary | ICD-10-CM

## 2024-01-22 DIAGNOSIS — R739 Hyperglycemia, unspecified: Secondary | ICD-10-CM

## 2024-01-22 DIAGNOSIS — N189 Chronic kidney disease, unspecified: Secondary | ICD-10-CM

## 2024-01-22 DIAGNOSIS — E875 Hyperkalemia: Secondary | ICD-10-CM

## 2024-01-22 HISTORY — PX: ESOPHAGOGASTRODUODENOSCOPY: SHX5428

## 2024-01-22 LAB — GLUCOSE, CAPILLARY
Glucose-Capillary: 111 mg/dL — ABNORMAL HIGH (ref 70–99)
Glucose-Capillary: 132 mg/dL — ABNORMAL HIGH (ref 70–99)
Glucose-Capillary: 92 mg/dL (ref 70–99)
Glucose-Capillary: 95 mg/dL (ref 70–99)

## 2024-01-22 LAB — BASIC METABOLIC PANEL WITH GFR
Anion gap: 7 (ref 5–15)
Anion gap: 11 (ref 5–15)
BUN: 54 mg/dL — ABNORMAL HIGH (ref 8–23)
BUN: 55 mg/dL — ABNORMAL HIGH (ref 8–23)
CO2: 26 mmol/L (ref 22–32)
CO2: 23 mmol/L (ref 22–32)
Calcium: 8.5 mg/dL — ABNORMAL LOW (ref 8.9–10.3)
Calcium: 9.8 mg/dL (ref 8.9–10.3)
Chloride: 107 mmol/L (ref 98–111)
Chloride: 108 mmol/L (ref 98–111)
Creatinine, Ser: 2.69 mg/dL — ABNORMAL HIGH (ref 0.61–1.24)
Creatinine, Ser: 2.71 mg/dL — ABNORMAL HIGH (ref 0.61–1.24)
GFR, Estimated: 25 mL/min — ABNORMAL LOW (ref >60)
GFR, Estimated: 24 mL/min — ABNORMAL LOW (ref >60)
Glucose, Bld: 105 mg/dL — ABNORMAL HIGH (ref 70–99)
Glucose, Bld: 140 mg/dL — ABNORMAL HIGH (ref 70–99)
Potassium: 5.8 mmol/L — ABNORMAL HIGH (ref 3.5–5.1)
Potassium: 5.6 mmol/L — ABNORMAL HIGH (ref 3.5–5.1)
Sodium: 140 mmol/L (ref 135–145)
Sodium: 142 mmol/L (ref 135–145)

## 2024-01-22 LAB — CBC
HCT: 44.5 % (ref 39.0–52.0)
Hemoglobin: 13.5 g/dL (ref 13.0–17.0)
MCH: 27.5 pg (ref 26.0–34.0)
MCHC: 30.3 g/dL (ref 30.0–36.0)
MCV: 90.6 fL (ref 80.0–100.0)
Platelets: 151 K/uL (ref 150–400)
RBC: 4.91 MIL/uL (ref 4.22–5.81)
RDW: 18 % — ABNORMAL HIGH (ref 11.5–15.5)
WBC: 6.2 K/uL (ref 4.0–10.5)
nRBC: 0 % (ref 0.0–0.2)

## 2024-01-22 LAB — MAGNESIUM: Magnesium: 2.5 mg/dL — ABNORMAL HIGH (ref 1.7–2.4)

## 2024-01-22 LAB — TRIGLYCERIDES: Triglycerides: 38 mg/dL (ref <150)

## 2024-01-22 LAB — PHOSPHORUS: Phosphorus: 4 mg/dL (ref 2.5–4.6)

## 2024-01-22 LAB — POTASSIUM: Potassium: 5.7 mmol/L — ABNORMAL HIGH (ref 3.5–5.1)

## 2024-01-22 MED ORDER — NOREPINEPHRINE 4 MG/250ML-% IV SOLN
INTRAVENOUS | Status: DC | PRN
  Administered 2024-01-22: 8 ug/min via INTRAVENOUS

## 2024-01-22 MED ORDER — ALBUMIN HUMAN 5 % IV SOLN: INTRAVENOUS | Status: DC | PRN

## 2024-01-22 MED ORDER — DEXTROSE 50 % IV SOLN: 1.0000 | Freq: Once | INTRAVENOUS | Status: DC

## 2024-01-22 MED ORDER — INSULIN ASPART 100 UNIT/ML IJ SOLN
0.0000 [IU] | INTRAMUSCULAR | Status: DC
  Administered 2024-01-23 (×2): 1 [IU] via SUBCUTANEOUS

## 2024-01-22 MED ORDER — ACETAMINOPHEN 10 MG/ML IV SOLN: 1000.0000 mg | Freq: Once | INTRAVENOUS | Status: DC | PRN

## 2024-01-22 MED ORDER — OXYCODONE HCL 5 MG PO TABS: 5.0000 mg | ORAL_TABLET | Freq: Once | ORAL | Status: DC | PRN

## 2024-01-22 MED ORDER — SODIUM CHLORIDE 0.9% FLUSH
10.0000 mL | Freq: Two times a day (BID) | INTRAVENOUS | Status: DC
  Administered 2024-01-22 – 2024-01-23 (×2): 20 mL
  Administered 2024-01-23 – 2024-01-24 (×3): 10 mL
  Administered 2024-01-25: 20 mL
  Administered 2024-01-25: 10 mL
  Administered 2024-01-26: 20 mL
  Administered 2024-01-26 – 2024-02-07 (×23): 10 mL
  Administered 2024-02-09: 20 mL
  Administered 2024-02-10: 10 mL
  Administered 2024-02-10: 30 mL
  Administered 2024-02-11 – 2024-03-08 (×49): 10 mL
  Administered 2024-03-08: 20 mL
  Administered 2024-03-09 – 2024-03-10 (×3): 10 mL
  Administered 2024-03-10 – 2024-03-11 (×2): 20 mL
  Administered 2024-03-11: 10 mL
  Administered 2024-03-12: 20 mL
  Administered 2024-03-12 – 2024-03-17 (×11): 10 mL
  Administered 2024-03-18: 30 mL
  Administered 2024-03-18 – 2024-03-25 (×14): 10 mL
  Administered 2024-03-26: 20 mL
  Administered 2024-03-26 – 2024-03-27 (×2): 10 mL
  Administered 2024-03-28: 20 mL
  Administered 2024-03-28: 30 mL
  Administered 2024-03-29 (×2): 20 mL
  Administered 2024-03-30 – 2024-03-31 (×4): 10 mL
  Administered 2024-04-01: 22:00:00 20 mL
  Administered 2024-04-01: 10:00:00 10 mL
  Administered 2024-04-02: 23:00:00 20 mL
  Administered 2024-04-02 – 2024-04-03 (×2): 10 mL
  Administered 2024-04-03: 21:00:00 20 mL
  Administered 2024-04-04 – 2024-04-14 (×22): 10 mL
  Administered 2024-04-15: 20 mL
  Administered 2024-04-16 – 2024-04-18 (×5): 10 mL
  Administered 2024-04-18: 30 mL
  Administered 2024-04-19: 20 mL
  Administered 2024-04-19 – 2024-04-20 (×2): 10 mL
  Administered 2024-04-20: 15 mL
  Administered 2024-04-21 – 2024-04-22 (×4): 10 mL
  Administered 2024-04-23: 20 mL
  Administered 2024-04-23 – 2024-04-26 (×7): 10 mL
  Administered 2024-04-27: 30 mL
  Administered 2024-04-27 – 2024-04-28 (×3): 10 mL
  Administered 2024-04-29: 30 mL

## 2024-01-22 MED ORDER — HYDROMORPHONE HCL 1 MG/ML IJ SOLN
0.5000 mg | INTRAMUSCULAR | Status: DC | PRN
  Administered 2024-01-22: 0.5 mg via INTRAVENOUS
  Administered 2024-01-22 – 2024-01-31 (×33): 1 mg via INTRAVENOUS
  Filled 2024-01-22 (×35): qty 1

## 2024-01-22 MED ORDER — PHENYLEPHRINE HCL-NACL 20-0.9 MG/250ML-% IV SOLN
INTRAVENOUS | Status: DC | PRN
  Administered 2024-01-22: 30 ug/min via INTRAVENOUS

## 2024-01-22 MED ORDER — FENTANYL CITRATE (PF) 250 MCG/5ML IJ SOLN
INTRAMUSCULAR | Status: AC
  Filled 2024-01-22: qty 5

## 2024-01-22 MED ORDER — BISACODYL 10 MG RE SUPP
10.0000 mg | Freq: Every day | RECTAL | Status: DC
Start: 2024-01-23 — End: 2024-02-27
  Administered 2024-01-24 – 2024-02-12 (×8): 10 mg via RECTAL
  Filled 2024-01-22 (×13): qty 1

## 2024-01-22 MED ORDER — MUPIROCIN 2 % EX OINT
1.0000 | TOPICAL_OINTMENT | Freq: Two times a day (BID) | CUTANEOUS | Status: AC
  Administered 2024-01-22 – 2024-01-27 (×10): 1 via NASAL
  Filled 2024-01-22 (×3): qty 22

## 2024-01-22 MED ORDER — ORAL CARE MOUTH RINSE: 15.0000 mL | Freq: Once | OROMUCOSAL | Status: AC

## 2024-01-22 MED ORDER — SUGAMMADEX SODIUM 200 MG/2ML IV SOLN
INTRAVENOUS | Status: DC | PRN
Start: 2024-01-22 — End: 2024-01-22
  Administered 2024-01-22: 200 mg via INTRAVENOUS

## 2024-01-22 MED ORDER — EPHEDRINE SULFATE-NACL 50-0.9 MG/10ML-% IV SOSY
PREFILLED_SYRINGE | INTRAVENOUS | Status: DC | PRN
  Administered 2024-01-22: 5 mg via INTRAVENOUS

## 2024-01-22 MED ORDER — FENTANYL CITRATE (PF) 100 MCG/2ML IJ SOLN: 25.0000 ug | INTRAMUSCULAR | Status: DC | PRN

## 2024-01-22 MED ORDER — LIDOCAINE 2% (20 MG/ML) 5 ML SYRINGE
INTRAMUSCULAR | Status: DC | PRN
  Administered 2024-01-22: 100 mg via INTRAVENOUS

## 2024-01-22 MED ORDER — MIDAZOLAM HCL 2 MG/2ML IJ SOLN
INTRAMUSCULAR | Status: AC
  Filled 2024-01-22: qty 2

## 2024-01-22 MED ORDER — THIAMINE HCL 100 MG/ML IJ SOLN
100.0000 mg | Freq: Every day | INTRAMUSCULAR | Status: DC
  Administered 2024-01-23: 100 mg via INTRAVENOUS
  Filled 2024-01-22 (×2): qty 2

## 2024-01-22 MED ORDER — INSULIN ASPART 100 UNIT/ML IV SOLN: 10.0000 [IU] | Freq: Once | INTRAVENOUS | Status: DC

## 2024-01-22 MED ORDER — CHLORHEXIDINE GLUCONATE 0.12 % MT SOLN
15.0000 mL | Freq: Once | OROMUCOSAL | Status: AC
  Administered 2024-01-22: 15 mL via OROMUCOSAL
  Filled 2024-01-22: qty 15

## 2024-01-22 MED ORDER — ORAL CARE MOUTH RINSE: 15.0000 mL | OROMUCOSAL | Status: DC | PRN

## 2024-01-22 MED ORDER — SODIUM CHLORIDE 0.9 % IV SOLN: INTRAVENOUS | Status: DC

## 2024-01-22 MED ORDER — 0.9 % SODIUM CHLORIDE (POUR BTL) OPTIME
TOPICAL | Status: DC | PRN
  Administered 2024-01-22: 1000 mL

## 2024-01-22 MED ORDER — FLUCONAZOLE IN SODIUM CHLORIDE 200-0.9 MG/100ML-% IV SOLN
200.0000 mg | INTRAVENOUS | Status: DC
  Administered 2024-01-22 – 2024-01-25 (×4): 200 mg via INTRAVENOUS
  Filled 2024-01-22 (×5): qty 100

## 2024-01-22 MED ORDER — ONDANSETRON HCL 4 MG/2ML IJ SOLN: 4.0000 mg | Freq: Once | INTRAMUSCULAR | Status: DC | PRN

## 2024-01-22 MED ORDER — FUROSEMIDE 10 MG/ML IJ SOLN
80.0000 mg | Freq: Once | INTRAMUSCULAR | Status: AC
  Administered 2024-01-22: 80 mg via INTRAVENOUS
  Filled 2024-01-22: qty 8

## 2024-01-22 MED ORDER — LACTATED RINGERS IV SOLN
INTRAVENOUS | Status: AC
Start: 2024-01-22 — End: 2024-01-23

## 2024-01-22 MED ORDER — IOHEXOL 300 MG/ML  SOLN
200.0000 mL | Freq: Once | INTRAMUSCULAR | Status: AC | PRN
  Administered 2024-01-22: 200 mL via ORAL

## 2024-01-22 MED ORDER — FENTANYL CITRATE (PF) 250 MCG/5ML IJ SOLN
INTRAMUSCULAR | Status: DC | PRN
  Administered 2024-01-22: 50 ug via INTRAVENOUS

## 2024-01-22 MED ORDER — DEXAMETHASONE SODIUM PHOSPHATE 10 MG/ML IJ SOLN
INTRAMUSCULAR | Status: DC | PRN
  Administered 2024-01-22: 4 mg via INTRAVENOUS

## 2024-01-22 MED ORDER — MIDAZOLAM HCL 2 MG/2ML IJ SOLN
INTRAMUSCULAR | Status: DC | PRN
Start: 2024-01-22 — End: 2024-01-22
  Administered 2024-01-22: 2 mg via INTRAVENOUS

## 2024-01-22 MED ORDER — CALCIUM GLUCONATE-NACL 1-0.675 GM/50ML-% IV SOLN: 1.0000 g | Freq: Once | INTRAVENOUS | Status: DC

## 2024-01-22 MED ORDER — SODIUM POLYSTYRENE SULFONATE 15 GM/60ML CO SUSP
30.0000 g | Freq: Once | Status: AC
  Administered 2024-01-22: 30 g via RECTAL
  Filled 2024-01-22 (×2): qty 120

## 2024-01-22 MED ORDER — OXYCODONE HCL 5 MG/5ML PO SOLN: 5.0000 mg | Freq: Once | ORAL | Status: DC | PRN

## 2024-01-22 MED ORDER — PROPOFOL 10 MG/ML IV BOLUS
INTRAVENOUS | Status: DC | PRN
  Administered 2024-01-22: 80 mg via INTRAVENOUS

## 2024-01-22 MED ORDER — ROCURONIUM BROMIDE 10 MG/ML (PF) SYRINGE
PREFILLED_SYRINGE | INTRAVENOUS | Status: DC | PRN
  Administered 2024-01-22: 70 mg via INTRAVENOUS

## 2024-01-22 MED ORDER — DROPERIDOL 2.5 MG/ML IJ SOLN: 0.6250 mg | Freq: Once | INTRAMUSCULAR | Status: DC | PRN

## 2024-01-22 MED ORDER — FAMOTIDINE IN NACL 20-0.9 MG/50ML-% IV SOLN
20.0000 mg | Freq: Two times a day (BID) | INTRAVENOUS | Status: DC
  Administered 2024-01-22 – 2024-01-30 (×18): 20 mg via INTRAVENOUS
  Filled 2024-01-22 (×21): qty 50

## 2024-01-22 MED ORDER — LACTATED RINGERS IV BOLUS
1000.0000 mL | Freq: Once | INTRAVENOUS | Status: AC
  Administered 2024-01-22: 1000 mL via INTRAVENOUS

## 2024-01-22 MED ORDER — CALCIUM GLUCONATE-NACL 2-0.675 GM/100ML-% IV SOLN
2.0000 g | Freq: Once | INTRAVENOUS | Status: AC
  Administered 2024-01-22: 2000 mg via INTRAVENOUS
  Filled 2024-01-22: qty 100

## 2024-01-22 MED ORDER — ONDANSETRON HCL 4 MG/2ML IJ SOLN
INTRAMUSCULAR | Status: DC | PRN
  Administered 2024-01-22: 4 mg via INTRAVENOUS

## 2024-01-22 MED ORDER — PHENYLEPHRINE 80 MCG/ML (10ML) SYRINGE FOR IV PUSH (FOR BLOOD PRESSURE SUPPORT)
PREFILLED_SYRINGE | INTRAVENOUS | Status: DC | PRN
  Administered 2024-01-22 (×4): 160 ug via INTRAVENOUS

## 2024-01-22 MED ORDER — FLUCONAZOLE IN SODIUM CHLORIDE 400-0.9 MG/200ML-% IV SOLN: 400.0000 mg | INTRAVENOUS | Status: DC

## 2024-01-22 MED ORDER — SODIUM CHLORIDE 0.9% FLUSH: 10.0000 mL | INTRAVENOUS | Status: DC | PRN

## 2024-01-22 MED ORDER — SODIUM BICARBONATE 8.4 % IV SOLN: 50.0000 meq | Freq: Once | INTRAVENOUS | Status: DC

## 2024-01-22 MED ORDER — TRACE MINERALS CU-MN-SE-ZN 300-55-60-3000 MCG/ML IV SOLN
INTRAVENOUS | Status: AC
  Filled 2024-01-22: qty 347.2

## 2024-01-22 NOTE — Progress Notes (Addendum)
   [address] [place] [address]             [phone]     Subjective: Still has epigastric substernal pain, better than last night  Objective: Vital signs in last 24 hours: Temp:  [97.6 F (36.4 C)-98.3 F (36.8 C)] 97.6 F (36.4 C) (10/06 0330) Pulse Rate:  [67-114] 85 (10/06 0600) Cardiac Rhythm: Sinus tachycardia (10/05 2200) Resp:  [13-31] 24 (10/06 0600) BP: (67-169)/(42-105) 96/59 (10/06 0600) SpO2:  [94 %-100 %] 100 % (10/06 0600) Weight:  [72.6 kg-72.8 kg] 72.8 kg (10/05 2140)  Hemodynamic parameters for last 24 hours:    Intake/Output from previous day: 10/05 0701 - 10/06 0700 In: 2467.1 [I.V.:1215.7; IV Piggyback:1251.5] Out: 350 [Urine:350] Intake/Output this shift: No intake/output data recorded.  General appearance: alert, cooperative, and mild distress Neurologic: intact Heart: irregular Lungs: clear to auscultation bilaterally Abdomen mildly tender, less guarding  Lab Results: Recent Labs    01/21/24 1439 01/21/24 1500 01/22/24 0215  WBC 9.5  --  6.2  HGB 14.8 15.3 13.5  HCT 47.9 45.0 44.5  PLT 208  --  151   BMET:  Recent Labs    01/21/24 1551 01/21/24 1728  NA 143 139  K 2.8* 3.8  CL 118* 104  CO2 17* 23  GLUCOSE 165* 210*  BUN 37* 49*  CREATININE 1.43* 2.15*  CALCIUM  5.5* 8.2*    PT/INR:  Recent Labs    01/21/24 1728  LABPROT 16.7*  INR 1.3*   ABG    Component Value Date/Time   PHART 7.395 07/05/2023 1646   HCO3 20.1 07/05/2023 1646   TCO2 23 01/21/2024 1500   ACIDBASEDEF 4.0 (H) 07/05/2023 1646   O2SAT 80.9 07/18/2023 0420   CBG (last 3)  Recent Labs    01/21/24 2133  GLUCAP 113*    Assessment/Plan:  Pneumomediastinum with question of esophageal perforation.  No free flow of contrast from lumen on swallow. Area of concern is not in a typical location for Boorhaave's as it is up near the carina WBC normal at 6 Continue NPO, IV antibiotics Will get swallow this AM Await GI input  for EGD  Continue to hold anticoagulation as it is not out of the question he could still require surgery I have asked Cardiology to see while in ICU    LOS: 1 day    Elspeth JAYSON Millers 01/22/2024  Normal esophagus by Outlaw's note from EGD 3/25  Elspeth C. Millers, MD [facility] [phone]

## 2024-01-22 NOTE — Progress Notes (Signed)
 Initial Nutrition Assessment  DOCUMENTATION CODES:   Non-severe (moderate) malnutrition in context of acute illness/injury  INTERVENTION:   TPN to meet nutritional needs -Concern for refeeding risk. Monitor magnesium , potassium, and phosphorus daily for at least 3 days, MD/Pharmacy to replete as needed.  -Add Thiamine 100 mg IV daily x 5 days   NUTRITION DIAGNOSIS:   Moderate Malnutrition related to acute illness (may have chronic component due to time frame of reported wt loss) as evidenced by mild fat depletion, percent weight loss, energy intake < or equal to 50% for > or equal to 5 days.   GOAL:   Patient will meet greater than or equal to 90% of their needs  MONITOR:   Diet advancement, I & O's, Labs, Weight trends (TPN)  REASON FOR ASSESSMENT:   Consult, Rounds New TPN/TNA  ASSESSMENT:   73 yo male admitted post several days of N/V followed by sudden onset of severe epigastric pain radiating to chest and back. Pt found to have small esophageal perforation above GE junction. PMH includes CAD, chronic HFpEF, HTN, HLD, DVT, CVA, PAF, CKD 3b.  10/9 Admitted  OR today for distal esophageal perforation above GE junction, +Small leak, most contrast entered stomach with plans for stent placement  Pt reports 2 episodes of vomiting on Friday then chest pain started. However, pt reports earlier last week pt experiencing N/V/D; reports other family memories had similar issues around that time, he wonders if he picked up a virus. Pt states the vomiting earlier in the week was so violent he almost got choked.   Pt reports UBW around 200 pounds several years ago. Current wt 160.5 pounds (72.9 kg). Per weight encounters, +wt loss over the past year.   Able to obtain limited nutrition history at this time as pt leaving for OR now  Labs: Potassium 5.8 (H) BUN 54 Creatinine 2.96 Phosphorus 4.0 (wdl) Magnesium  2.5 (H) CBGs 92-113  Meds: Dulcolax suppository Calcium   gluconate x 1 IV diflucan SS  novolog  IV Zosyn   NUTRITION - FOCUSED PHYSICAL EXAM:  Flowsheet Row Most Recent Value  Orbital Region No depletion  Upper Arm Region No depletion  Thoracic and Lumbar Region No depletion  Buccal Region No depletion  Temple Region Mild depletion  Clavicle Bone Region Mild depletion  Clavicle and Acromion Bone Region Mild depletion  Scapular Bone Region Mild depletion  Dorsal Hand No depletion  Patellar Region No depletion  Anterior Thigh Region No depletion  Posterior Calf Region No depletion  Edema (RD Assessment) None    Diet Order:   Diet Order             Diet NPO time specified Except for: Ice Chips  Diet effective now                   EDUCATION NEEDS:   Education needs have been addressed  Skin:  Skin Assessment: Reviewed RN Assessment  Last BM:  10/4  Height:   Ht Readings from Last 1 Encounters:  01/22/24 6' 1 (1.854 m)    Weight:   Wt Readings from Last 1 Encounters:  01/22/24 72.8 kg   BMI:  Body mass index is 21.17 kg/m.  Estimated Nutritional Needs:   Kcal:  2100-2300 kcals  Protein:  110-130 g  Fluid:  2L  Betsey Finger MS, RDN, LDN, CNSC Registered Dietitian 3 Clinical Nutrition RD Inpatient Contact Info in Amion

## 2024-01-22 NOTE — Op Note (Signed)
      [address]       [name] [address]             [phone]        01/22/2024  Patient:  Billy [name] Pre-Op Dx: esophageal perforation   Post-op Dx:  same Procedure: - Esophagogastroscopy - 125 mm mm covered esophageal stent placement   Surgeon and Role:      * Doloras Tellado, Linnie KIDD, MD - Primary Anesthesia  general EBL:  none Blood Administration: none Specimen:  none   Counts: correct   Indications: 73yo male admitted with a distal esophageal perforation.  He was symptomatic for several days prior to presenting the ED.  Findings: Mucosal perforation noted at 40 from the incisors.  Good stent coverage.    Operative Technique: After the risks, benefits and alternatives were thoroughly discussed, the patient was brought to the operative theatre.  Anesthesia was induced. The patient was prepped and draped in normal sterile fashion.  An appropriate surgical pause was performed, and pre-operative antibiotics were dosed accordingly.  The gastroscope was advanced through the oropharynx into the cervical esophagus under direct visualization.  The scope was passed into the stomach.  The scope was then pulled back, and the esophageal mucosa was visualized.  A mucosal defect was noted at 40 cm from the incisors.  This area was marked on the skin with paper clips.    Next a Jag wire was passed through the gastroscope into the stomach with fluoroscopic guidance.  The esophageal stent was then placed over the wire, and positioned under fluoroscopy to cover the mucosal defect.    The patient tolerated the procedure without any immediate complications, and was transferred to the PACU in stable condition.  Billy [name]

## 2024-01-22 NOTE — Progress Notes (Signed)
   [address] [place] [address]             [phone]    Remains relatively comfortable  BP (!) 86/48   Pulse 85   Temp 98.2 F (36.8 C) (Oral)   Resp (!) 27   Ht 6' 1 (1.854 m)   Wt 72.8 kg   SpO2 99%   BMI 21.17 kg/m  BP awake 128/72  Reviewed swallow images.  Abnormality near carina on Ct images appears to be artifactual. Has a distal esophageal perforation above GE junction at classic location for Boorhaave's.  Only a small leak and most contrast goes through to stomach.  Minimal staining post swallow.  D/w Dr. Shyrl- he will see patient for EGD stent placement  Elspeth C. Kerrin, MD [facility] [phone]

## 2024-01-22 NOTE — Anesthesia Procedure Notes (Addendum)
 Procedure Name: Intubation Date/Time: 01/22/2024 1:43 PM  Performed by: Lamar Lucie DASEN, CRNAPre-anesthesia Checklist: Patient identified, Emergency Drugs available, Suction available and Patient being monitored Patient Re-evaluated:Patient Re-evaluated prior to induction Oxygen  Delivery Method: Circle system utilized Preoxygenation: Pre-oxygenation with 100% oxygen  Induction Type: IV induction Ventilation: Mask ventilation without difficulty Laryngoscope Size: Mac and 4 Grade View: Grade I Tube type: Oral Tube size: 7.5 mm Number of attempts: 1 Airway Equipment and Method: Stylet and Oral airway Placement Confirmation: ETT inserted through vocal cords under direct vision, positive ETCO2 and breath sounds checked- equal and bilateral Secured at: 23 cm Tube secured with: Tape Dental Injury: Teeth and Oropharynx as per pre-operative assessment  Comments: Performed by [facility]

## 2024-01-22 NOTE — Progress Notes (Signed)
 NAME:  Billy [name], MRN:  [id number], DOB:  12/20/1950, LOS: 1 ADMISSION DATE:  01/21/2024, CONSULTATION DATE:  01/21/24 REFERRING MD:  EDP, CHIEF COMPLAINT:  chest pain   History of Present Illness:  73 year old man known to our service with cardiac arrest earlier this year who is presenting with several days of N/V from a stomach bug followed by sudden onset severe tearing epigastric pain radiating to back and chest.  Workup reveals likely esophageal tear.  Noted prior EGD during cardiac arrest admit showing duodenitis nonspecific requiring hemospray, erosive gastritis but normal esophagus.  TCTS has evaluated.  Given Kcentra I guess for apixiban?  Pertinent  Medical History  HFrEF Afib on [facility]  Significant Hospital Events: Including procedures, antibiotic start and stop dates in addition to other pertinent events   10/5 admitted, NPO  Interim History / Subjective:  Pain is better overall, but still has heartburn. Remains on PCA with dilaudid  this morning.    Objective    Blood pressure (!) 86/48, pulse 85, temperature 98.2 F (36.8 C), temperature source Oral, resp. rate (!) 27, height 6' 1 (1.854 m), weight 72.8 kg, SpO2 99%.        Intake/Output Summary (Last 24 hours) at 01/22/2024 1056 Last data filed at 01/22/2024 1000 Gross per 24 hour  Intake 3008.78 ml  Output 550 ml  Net 2458.78 ml   Filed Weights   01/21/24 1343 01/21/24 2140 01/22/24 0701  Weight: 72.6 kg 72.8 kg 72.8 kg    Examination: General: chronically ill appearing man lying in bed in NAD HENT: [place]/AT, eyes anicteric Lungs: breathing comfortably on [place], CTAB Cardiovascular: S1S2, RRR Abdomen: soft, NT Extremities: no cyanosis or edema Neuro: Awake, alert, moving all extremities. Answering questions appropriately.  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 Planning for 6 week course of antibiotics and antifungals.   AKI on CKD3b -strict I/O -con't IVF; decreasing once on TPN -monitor  Paroxysmal Afib on AC- given K centra in ER -holding apixaban  -hopefully can start DVT prophylaxis tomorrow -tele monitoring  Chronic HFrEF -monitor for hypervolemia -hold PTA Entresto  and torsemide   HTN; hypotensive overnight, likely related to multiple pain/ sleeping/ anxiety meds before bed -hold PTA Entresto   Hyperkalemia -STAT repeat BMP -rectal kayexalate since strict NPO -2g Ca+  -lasix  x 1 with IVF to help with K+ -bicarb push  Hyperglycemia -SSI PRN -goal BG 140-180  Stable to transfer out of ICU. [name] to assume care tomorrow.   Labs   CBC: Recent Labs  Lab 01/21/24 1439 01/21/24 1500 01/22/24 0215  WBC 9.5  --  6.2  NEUTROABS 8.1*  --   --   HGB 14.8 15.3 13.5  HCT 47.9 45.0 44.5  MCV 89.9  --  90.6  PLT 208  --  151    Basic Metabolic Panel: Recent Labs  Lab 01/21/24 1500 01/21/24 1551 01/21/24 1728 01/22/24 0816  NA 140 143 139 140  K 4.3 2.8* 3.8 5.8*  CL 107 118* 104 107  CO2  --  17* 23 26  GLUCOSE  223* 165* 210* 105*  BUN 52* 37* 49* 54*  CREATININE 2.30* 1.43* 2.15* 2.69*  CALCIUM   --  5.5* 8.2* 8.5*  MG  --   --   --  2.5*  PHOS  --   --   --  4.0   GFR: Estimated Creatinine Clearance: 25.9 mL/min (A) (by C-G formula based on SCr of 2.69 mg/dL (H)). Recent Labs  Lab 01/21/24 1439 01/22/24 0215  WBC 9.5 6.2    Liver Function Tests: Recent Labs  Lab 01/21/24 1551  AST 21  ALT 21  ALKPHOS 33*  BILITOT 0.5  PROT 3.8*  ALBUMIN  2.0*   Recent Labs  Lab 01/21/24 1551  LIPASE 55*      Critical care time:       Billy Billy Gaskins, DO 01/22/24 6:07 PM [facility]  For contact information, see Amion. If no response to pager, please call PCCM consult pager. After hours, 7PM- 7AM, please call Elink.

## 2024-01-22 NOTE — Anesthesia Preprocedure Evaluation (Addendum)
 Anesthesia Evaluation  Patient identified by MRN, date of birth, ID band Patient awake    Reviewed: Allergy & Precautions, NPO status , Patient's Chart, lab work & pertinent test results  History of Anesthesia Complications (+) PONV and history of anesthetic complications  Airway Mallampati: II       Dental  (+) Teeth Intact, Dental Advisory Given   Pulmonary PE   breath sounds clear to auscultation       Cardiovascular hypertension, + Past MI, +CHF and + DVT   Rhythm:Regular Rate:Normal  TTE (2025): 1. Left ventricular ejection fraction, by estimation, is 60 to 65%. The  left ventricle has normal function. The left ventricle has no regional  wall motion abnormalities. There is moderate left ventricular hypertrophy.  Left ventricular diastolic  parameters were normal.   2. Right ventricular systolic function is normal. The right ventricular  size is normal. Tricuspid regurgitation signal is inadequate for assessing  PA pressure.   3. The mitral valve is grossly normal. Trivial mitral valve  regurgitation. No evidence of mitral stenosis.   4. The aortic valve is tricuspid. There is moderate calcification of the  aortic valve. There is moderate thickening of the aortic valve. Aortic  valve regurgitation is not visualized. Aortic valve  sclerosis/calcification is present, without any evidence  of aortic stenosis.   5. The inferior vena cava is normal in size with greater than 50%  respiratory variability, suggesting right atrial pressure of 3 mmHg     Neuro/Psych    GI/Hepatic   Endo/Other    Renal/GU Renal InsufficiencyRenal disease     Musculoskeletal   Abdominal   Peds  Hematology  (+) Blood dyscrasia, anemia   Anesthesia Other Findings   Reproductive/Obstetrics                              Anesthesia Physical Anesthesia Plan  ASA: 3  Anesthesia Plan: General   Post-op Pain  Management:    Induction: Intravenous  PONV Risk Score and Plan: 2 and Treatment may vary due to age or medical condition and Ondansetron   Airway Management Planned: Oral ETT  Additional Equipment: Arterial line  Intra-op Plan:   Post-operative Plan: Extubation in OR  Informed Consent:      Dental advisory given  Plan Discussed with: CRNA and Surgeon  Anesthesia Plan Comments:         Anesthesia Quick Evaluation

## 2024-01-22 NOTE — Progress Notes (Signed)
     [address]       [name] [address]             [phone]       Esophagram shows leak OR for EGD, and stent placement  Cieara Stierwalt O Marcille Barman

## 2024-01-22 NOTE — Progress Notes (Addendum)
 I was consulted by [facility] on this patient for esophageal perforation.  Esophagogram/barium tablet swallow from 01/22/2024: Distal esophageal perforation Progressive accumulation of contrast within a collection containing gas in the lower mediastinum, left of the esophagus  CT chest 01/21/2024: Extensive pneumomediastinum Masslike area of soft tissue thickening adjacent to and surrounding esophagus distally unchanged, possible esophageal mass, hiatal hernia, infection  EGD 07/01/2023, hematochezia, melena: Unremarkable esophagus, NG tube induced trauma, ulcer deformity in duodenum, friable mucosa with bleeding in duodenum, hemostatic spray used  This is a full-thickness disruption of esophageal wall which can rapidly lead to severe infection, sepsis and potentially death if not promptly managed with a high mortality rate depending upon patient, with overall mortality ranges from 40 to 80%.  Initial stabilization with n.p.o., securing airway if needed, aggressive fluid resuscitation, antibiotics covering both aerobes and anaerobes,proton pump inhibitors and hemodynamic monitoring recommended.  Based on the imaging, endoscopic therapies such as clips, suturing, stenting, not possible in this patient.  Recommend surgical intervention as early intervention within 24 hours is associated with a significantly improved outcome and lower mortality.

## 2024-01-22 NOTE — Transfer of Care (Signed)
 Immediate Anesthesia Transfer of Care Note  Patient: Billy [name]  Procedure(s) Performed: EGD (ESOPHAGOGASTRODUODENOSCOPY) WITH STENT PLACEMENT  Patient Location: PACU  Anesthesia Type:General  Level of Consciousness: awake, alert , and oriented  Airway & Oxygen  Therapy: Patient Spontanous Breathing and Patient connected to face mask oxygen   Post-op Assessment: Report given to RN and Post -op Vital signs reviewed and stable  Post vital signs: Reviewed and stable  Last Vitals:  Vitals Value Taken Time  BP 149/59 01/22/24 14:46  Temp 36.1 C 01/22/24 14:45  Pulse 81 01/22/24 14:52  Resp 20 01/22/24 14:52  SpO2 100 % 01/22/24 14:52  Vitals shown include unfiled device data.  Last Pain:  Vitals:   01/22/24 1445  TempSrc:   PainSc: Asleep         Complications: No notable events documented.

## 2024-01-22 NOTE — Anesthesia Postprocedure Evaluation (Signed)
 Anesthesia Post Note  Patient: Billy [name]  Procedure(s) Performed: EGD (ESOPHAGOGASTRODUODENOSCOPY) WITH STENT PLACEMENT     Patient location during evaluation: PACU Anesthesia Type: General Level of consciousness: awake and oriented Pain management: pain level controlled Vital Signs Assessment: post-procedure vital signs reviewed and stable Respiratory status: spontaneous breathing Cardiovascular status: blood pressure returned to baseline Postop Assessment: no apparent nausea or vomiting and no headache Anesthetic complications: no   No notable events documented.             Lauraine KATHEE [place]

## 2024-01-22 NOTE — Plan of Care (Signed)

## 2024-01-22 NOTE — Progress Notes (Signed)
 Peripherally Inserted Central Catheter Placement  The IV Nurse has discussed with the patient and/or persons authorized to consent for the patient, the purpose of this procedure and the potential benefits and risks involved with this procedure.  The benefits include less needle sticks, lab draws from the catheter, and the patient may be discharged home with the catheter. Risks include, but not limited to, infection, bleeding, blood clot (thrombus formation), and puncture of an artery; nerve damage and irregular heartbeat and possibility to perform a PICC exchange if needed/ordered by physician.  Alternatives to this procedure were also discussed.  Bard Power PICC patient education guide, fact sheet on infection prevention and patient information card has been provided to patient /or left at bedside.    PICC Placement Documentation  PICC Double Lumen 01/22/24 Right Brachial 39 cm 0 cm (Active)  Indication for Insertion or Continuance of Line Administration of hyperosmolar/irritating solutions (i.e. TPN, Vancomycin, etc.) 01/22/24 1656  Exposed Catheter (cm) 0 cm 01/22/24 1656  Site Assessment Clean, Dry, Intact 01/22/24 1656  Lumen #1 Status Flushed;Saline locked;Blood return noted 01/22/24 1656  Lumen #2 Status Flushed;Saline locked;Blood return noted 01/22/24 1656  Dressing Type Transparent;Securing device 01/22/24 1656  Dressing Status Antimicrobial disc/dressing in place;Clean, Dry, Intact 01/22/24 1656  Line Care Connections checked and tightened 01/22/24 1656  Line Adjustment (NICU/IV Team Only) No 01/22/24 1656  Dressing Intervention New dressing;Adhesive placed at insertion site (IV team only) 01/22/24 1656  Dressing Change Due 01/29/24 01/22/24 1656       Renaee Neville Skillern 01/22/2024, 4:57 PM

## 2024-01-22 NOTE — Progress Notes (Signed)
 PHARMACY - TOTAL PARENTERAL NUTRITION CONSULT NOTE   Indication: intolerance to enteral feedings, esophageal performation  Patient Measurements: Height: 6' 1 (185.4 cm) Weight: 72.8 kg (160 lb 7.9 oz) IBW/kg (Calculated) : 79.9 TPN AdjBW (KG): 72.8 Body mass index is 21.17 kg/m.  Assessment:  73 year old man with medical history significant for cardiac arrest earlier this year who was admitted after several days of nausea and vomiting from a stomach bug followed by sudden onset severe tearing epigastric pain radiating to back and chest. 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 Pharmacy consulted to initiate TPN due to inability to tolerate enteral nutrition at this time.  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 PTA takes torsemide  20 mg PO daily, now held. Aggressive fluid resuscitation recommended per GI. Per HF, patient currently euvolemic but will hold diuretic for now. Due to CHF will target lower end of fluid range for TPN and supplement additional fluids per MD outside of TPN to prevent fluid overload.   Glucose / Insulin : A1x (02/2023) 6.0; CBGs 100s not on insulin  while NPO  Electrolytes: k up 5.8 (no supplementation), Na 140, Mg 25, Phos 4, CoCa ~10, others wnl Renal: Scr up 2.69 (bsl ~2), BUN up 54 Hepatic: AST/ALT wnl, tbili 0.5, alb 2 Intake / Output; MIVF: x1 LR bolus 10/5, UOP charted 200 mL, LBM PTA (charted as 10/04)  GI Imaging: 10/06 Esophagogram/barium study: distal esophageal perforation 10/05 CT: concerning for esophageal perforation, masslike area of soft tissue thickening adjacent to and surrounding the esophagus - differential considerations remain esophageal mass, hiatal hernia, or infection GI Surgeries / Procedures:  None since TPN intiation  Central access: 01/22/24 TPN start date: 01/22/24  Nutritional Goals: Goal TPN rate is 75 mL/hr (provides ~112 g of protein and ~1850 kcals per day)  RD Assessment:  pending Estimated Needs Total Energy Estimated Needs: 2100-2300 kcals Total Protein Estimated Needs: 110-130 g Total Fluid Estimated Needs: 2L  Current Nutrition:  NPO 10/05 >> TPN (10/06) >>  Plan:  Start TPN at 35 mL/hr at 1800 to meet ~50% total nutritional needs Electrolytes in TPN: Na 50 mEq/L, K 0 mEq/L, Ca 5 mEq/L, Mg 0 mEq/L, and Phos 5 mmol/L. Cl:Ac 1:2 Add standard MVI and trace elements to TPN Initiate Moderate q4h SSI and adjust as needed  Add thiamine 100 mg IV x5 days per RD recommendation Additional IVF outside of TPN per MD Monitor TPN labs on Mon/Thurs, daily while initiating TPN  Thank you for allowing pharmacy to be a part of this patient's care.  Shelba Collier, PharmD, BCPS Clinical Pharmacist
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
## 2024-01-23 ENCOUNTER — Encounter (HOSPITAL_COMMUNITY): Payer: Self-pay | Admitting: Thoracic Surgery (Cardiothoracic Vascular Surgery)

## 2024-01-23 ENCOUNTER — Inpatient Hospital Stay (HOSPITAL_COMMUNITY)

## 2024-01-23 DIAGNOSIS — N183 Chronic kidney disease, stage 3 unspecified: Secondary | ICD-10-CM | POA: Diagnosis not present

## 2024-01-23 DIAGNOSIS — I480 Paroxysmal atrial fibrillation: Secondary | ICD-10-CM | POA: Diagnosis not present

## 2024-01-23 DIAGNOSIS — E440 Moderate protein-calorie malnutrition: Secondary | ICD-10-CM | POA: Insufficient documentation

## 2024-01-23 DIAGNOSIS — Z9889 Other specified postprocedural states: Secondary | ICD-10-CM | POA: Diagnosis not present

## 2024-01-23 DIAGNOSIS — K223 Perforation of esophagus: Secondary | ICD-10-CM | POA: Diagnosis not present

## 2024-01-23 DIAGNOSIS — I5032 Chronic diastolic (congestive) heart failure: Secondary | ICD-10-CM | POA: Diagnosis not present

## 2024-01-23 DIAGNOSIS — N179 Acute kidney failure, unspecified: Secondary | ICD-10-CM | POA: Diagnosis not present

## 2024-01-23 LAB — PHOSPHORUS: Phosphorus: 2.8 mg/dL (ref 2.5–4.6)

## 2024-01-23 LAB — POTASSIUM
Potassium: 4.1 mmol/L (ref 3.5–5.1)
Potassium: 4.8 mmol/L (ref 3.5–5.1)
Potassium: 5.8 mmol/L — ABNORMAL HIGH (ref 3.5–5.1)

## 2024-01-23 LAB — BASIC METABOLIC PANEL WITH GFR
Anion gap: 11 (ref 5–15)
BUN: 56 mg/dL — ABNORMAL HIGH (ref 8–23)
CO2: 25 mmol/L (ref 22–32)
Calcium: 9.2 mg/dL (ref 8.9–10.3)
Chloride: 106 mmol/L (ref 98–111)
Creatinine, Ser: 2.68 mg/dL — ABNORMAL HIGH (ref 0.61–1.24)
GFR, Estimated: 25 mL/min — ABNORMAL LOW (ref >60)
Glucose, Bld: 175 mg/dL — ABNORMAL HIGH (ref 70–99)
Potassium: 5.5 mmol/L — ABNORMAL HIGH (ref 3.5–5.1)
Sodium: 142 mmol/L (ref 135–145)

## 2024-01-23 LAB — HEMOGLOBIN A1C
Hgb A1c MFr Bld: 6 % — ABNORMAL HIGH (ref 4.8–5.6)
Mean Plasma Glucose: 125.5 mg/dL

## 2024-01-23 LAB — GLUCOSE, CAPILLARY
Glucose-Capillary: 183 mg/dL — ABNORMAL HIGH (ref 70–99)
Glucose-Capillary: 164 mg/dL — ABNORMAL HIGH (ref 70–99)
Glucose-Capillary: 128 mg/dL — ABNORMAL HIGH (ref 70–99)
Glucose-Capillary: 158 mg/dL — ABNORMAL HIGH (ref 70–99)
Glucose-Capillary: 137 mg/dL — ABNORMAL HIGH (ref 70–99)
Glucose-Capillary: 155 mg/dL — ABNORMAL HIGH (ref 70–99)

## 2024-01-23 LAB — MAGNESIUM: Magnesium: 2.4 mg/dL (ref 1.7–2.4)

## 2024-01-23 LAB — HEPARIN LEVEL (UNFRACTIONATED): Heparin Unfractionated: >1.1 [IU]/mL — ABNORMAL HIGH (ref 0.30–0.70)

## 2024-01-23 LAB — APTT: aPTT: 45 s — ABNORMAL HIGH (ref 24–36)

## 2024-01-23 MED ORDER — TRAVASOL 10 % IV SOLN
INTRAVENOUS | Status: AC
  Filled 2024-01-23: qty 1100.2

## 2024-01-23 MED ORDER — INSULIN ASPART 100 UNIT/ML IJ SOLN
0.0000 [IU] | INTRAMUSCULAR | Status: DC
  Administered 2024-01-23: 2 [IU] via SUBCUTANEOUS
  Administered 2024-01-23 – 2024-01-24 (×3): 3 [IU] via SUBCUTANEOUS
  Administered 2024-01-24 (×2): 2 [IU] via SUBCUTANEOUS
  Administered 2024-01-24 (×3): 3 [IU] via SUBCUTANEOUS
  Administered 2024-01-25: 2 [IU] via SUBCUTANEOUS
  Administered 2024-01-25 (×2): 3 [IU] via SUBCUTANEOUS
  Administered 2024-01-25: 2 [IU] via SUBCUTANEOUS
  Administered 2024-01-25: 3 [IU] via SUBCUTANEOUS
  Administered 2024-01-25: 2 [IU] via SUBCUTANEOUS
  Administered 2024-01-25 – 2024-01-26 (×2): 3 [IU] via SUBCUTANEOUS
  Administered 2024-01-26 (×2): 2 [IU] via SUBCUTANEOUS
  Administered 2024-01-26: 3 [IU] via SUBCUTANEOUS
  Administered 2024-01-26: 2 [IU] via SUBCUTANEOUS
  Administered 2024-01-27 (×2): 3 [IU] via SUBCUTANEOUS
  Administered 2024-01-27: 5 [IU] via SUBCUTANEOUS

## 2024-01-23 MED ORDER — FUROSEMIDE 10 MG/ML IJ SOLN
40.0000 mg | Freq: Once | INTRAMUSCULAR | Status: AC
Start: 2024-01-23 — End: 2024-01-23
  Administered 2024-01-23: 40 mg via INTRAVENOUS
  Filled 2024-01-23: qty 4

## 2024-01-23 MED ORDER — LIDOCAINE 5 % EX PTCH
1.0000 | MEDICATED_PATCH | CUTANEOUS | Status: AC
  Administered 2024-01-23: 1 via TRANSDERMAL
  Filled 2024-01-23: qty 1

## 2024-01-23 MED ORDER — HEPARIN (PORCINE) 25000 UT/250ML-% IV SOLN
500.0000 [IU]/h | INTRAVENOUS | Status: DC
  Administered 2024-01-23: 500 [IU]/h via INTRAVENOUS
  Filled 2024-01-23: qty 250

## 2024-01-23 NOTE — Progress Notes (Signed)
 Brief Nutrition Follow-up:  OR yesterday with esophageal stent with EGD by Dr. Shyrl. Remains NPO, noted plan for barium swallow prior to diet advancement.   Billy [name] remains an issue but being addressed.   TPN initiated yesterday via PICC. TPN at 35 ml/hr on initiation, noted plan to advanced to 80 ml/hr this evening.   Labs reviewed: Potassium 4.8 (hyperkalemia improved), BUN 56, Creatinine 2.68-relatively stable, phosphorus 2.8, magnesium  2.4, CBGs 111-183  (Goal 140-180)  Interventions: 1) Recommend continuing TPN to meet nutritional needs until diet advanced past liquids, pt demonstrating tolerance of puree type consistency and eating adequately  Further recommendations pending diet advancement  Billy Finger MS, RDN, LDN, CNSC Registered Dietitian 3 Clinical Nutrition RD Inpatient Contact Info in Amion

## 2024-01-23 NOTE — Progress Notes (Addendum)
 TCTS DAILY ICU PROGRESS NOTE                   [address]            [place] [address]          [phone]   1 Day Post-Op Procedure(s) (LRB): EGD (ESOPHAGOGASTRODUODENOSCOPY) WITH STENT PLACEMENT (N/A)  Total Length of Stay:  LOS: 2 days   Subjective: Some discomfort but pain mostly controlled  Objective: Vital signs in last 24 hours: Temp:  [97 F (36.1 C)-99.1 F (37.3 C)] 97.4 F (36.3 C) (10/07 0700) Pulse Rate:  [76-98] 77 (10/07 0700) Cardiac Rhythm: Normal sinus rhythm (10/06 2100) Resp:  [15-28] 17 (10/07 0700) BP: (86-160)/(44-78) 125/65 (10/07 0700) SpO2:  [86 %-100 %] 93 % (10/07 0700) Weight:  [72.8 kg-75.5 kg] 75.5 kg (10/07 0500)  Filed Weights   01/22/24 0701 01/22/24 1140 01/23/24 0500  Weight: 72.8 kg 72.8 kg 75.5 kg    Weight change: 0.225 kg   Hemodynamic parameters for last 24 hours:    Intake/Output from previous day: 10/06 0701 - 10/07 0700 In: 2476.2 [I.V.:1665.4; IV Piggyback:810.9] Out: 1250 [Urine:1250]  Intake/Output this shift: No intake/output data recorded.  Current Meds: Scheduled Meds:  bisacodyl   10 mg Rectal Q0600   Chlorhexidine  Gluconate Cloth  6 each Topical Daily   insulin  aspart  10 Units Intravenous Once   And   dextrose   1 ampule Intravenous Once   furosemide   40 mg Intravenous Once   insulin  aspart  0-6 Units Subcutaneous Q4H   mupirocin ointment  1 Application Nasal BID   pantoprazole  (PROTONIX ) IV  40 mg Intravenous QHS   sodium bicarbonate   50 mEq Intravenous Once   sodium chloride  flush  10-40 mL Intracatheter Q12H   thiamine (VITAMIN B1) injection  100 mg Intravenous Daily   Continuous Infusions:  calcium  gluconate     famotidine (PEPCID) IV Stopped (01/22/24 2221)   fluconazole (DIFLUCAN) IV Stopped (01/22/24 1611)   lactated ringers  40 mL/hr at 01/23/24 0700   piperacillin -tazobactam (ZOSYN )  IV 12.5 mL/hr at 01/23/24 0700   TPN ADULT (ION) 35 mL/hr at 01/23/24 0700   PRN  Meds:.HYDROmorphone  (DILAUDID ) injection, mouth rinse, sodium chloride  flush  General appearance: alert, cooperative, and no distress Heart: regular rate and rhythm and occas extrasystole Lungs: clear anteriorly  Abdomen: soft, non tender or distended Extremities: PAS in place  Lab Results: CBC: Recent Labs    01/21/24 1439 01/21/24 1500 01/22/24 0215  WBC 9.5  --  6.2  HGB 14.8 15.3 13.5  HCT 47.9 45.0 44.5  PLT 208  --  151   BMET:  Recent Labs    01/22/24 2035 01/23/24 0019 01/23/24 0430  NA 142  --  142  K 5.6* 5.8* 5.5*  CL 108  --  106  CO2 23  --  25  GLUCOSE 140*  --  175*  BUN 55*  --  56*  CREATININE 2.71*  --  2.68*  CALCIUM  9.8  --  9.2    CMET: Lab Results  Component Value Date   WBC 6.2 01/22/2024   HGB 13.5 01/22/2024   HCT 44.5 01/22/2024   PLT 151 01/22/2024   GLUCOSE 175 (H) 01/23/2024   CHOL 107 03/10/2023   TRIG 38 01/22/2024   HDL 38 (L) 03/10/2023   LDLCALC 55 03/10/2023   ALT 21 01/21/2024   AST 21 01/21/2024   NA 142 01/23/2024   K 5.5 (H)  01/23/2024   CL 106 01/23/2024   CREATININE 2.68 (H) 01/23/2024   BUN 56 (H) 01/23/2024   CO2 25 01/23/2024   TSH 4.330 01/01/2024   INR 1.3 (H) 01/21/2024   HGBA1C 6.0 (H) 03/10/2023      PT/INR:  Recent Labs    01/21/24 1728  LABPROT 16.7*  INR 1.3*   Radiology: DG C-Arm 1-60 Min-No Report Result Date: 01/22/2024 Fluoroscopy was utilized by the requesting physician.  No radiographic interpretation.   US  EKG SITE RITE Result Date: 01/22/2024 If Site Rite image not attached, placement could not be confirmed due to current cardiac rhythm.  DG ESOPHAGUS W SINGLE CM (SOL OR THIN BA) Result Date: 01/22/2024 CLINICAL DATA:  Three day history of nausea and vomiting. Presented to ED yesterday with abdominal and chest pain. CT chest shows pneumomediastinum and masslike soft tissue thickening about the distal esophagus. Consult for water  soluble esophagram to rule out esophageal perforation.  EXAM: ESOPHAGUS/BARIUM SWALLOW/TABLET STUDY TECHNIQUE: Single contrast examination was performed using water  soluble Omnipaque  300. This exam was performed by Kimble Clas, PA-C, and was supervised and interpreted by Dr. Dasie Hamburg. FLUOROSCOPY: Radiation Exposure Index (as provided by the fluoroscopic device): 19.5 mGy Kerma COMPARISON:  CT Angio C/A/P and CT chest from 01/21/24 FINDINGS: The study was limited secondary to decreased patient mobility and inability to stand. During the initial swallow, there is evidence of contrast extravasation outside of the normal confines of the distal esophagus just proximal to the GE junction. With additional swallows, there is progressive accumulation of contrast within a collection containing gas in the lower mediastinum to the left of the esophagus. These results will be called to the ordering clinician or representative by the Radiologist Assistant, and communication documented in the PACS or [employer]. IMPRESSION: Findings consistent with distal esophageal perforation. Electronically Signed   By: Dasie Hamburg M.D.   On: 01/22/2024 09:29     Assessment/Plan: S/P Procedure(s) (LRB): EGD (ESOPHAGOGASTRODUODENOSCOPY) WITH STENT PLACEMENT (N/A) POD#1 Esoph stent placement  1 afeb, s BP 110's-140's, SR 2 sats ok on RA 3 good UOP 4 ID- Diflucan and Zosyn  for BSC, plan for 6 weeks  5 GI ppx-IV pepcid 6 good UOP, + stools 7 AKI on CKD3- cont to monitor closely 8 consults CCM, AHF, GI  9 apix currently on hold for afib ACRx 10 will need swallow at some point prior to feeding 11 pulm hygiene and mobilize - routine  Billy [name] Cera PA-C 01/23/2024 7:48 AM  Agree Clinically doing better Will order DG esophagram today to make sure there is no leakage around the stent If clear, can start liquid diet.  Ideally, should be able to go home on purred diet for 6 weeks Will need abx coverage for full 6 weeks until stent removal. Ok for floor.  Shaasia Odle MALVA Rayas

## 2024-01-23 NOTE — Progress Notes (Addendum)
 Pt noted coughing up blood clots. A medium amount. Pt is not having ongoing bleeding at this time. Hep drip paused until further orders?   Orders to stop hep infusion and monitor pt for bleeding and watch labs.

## 2024-01-23 NOTE — TOC Initial Note (Signed)
 Transition of Care [facility]) - Initial/Assessment Note    Patient Details  Name: Billy [name] MRN: [id number] Date of Birth: 1950/06/07  Transition of Care [facility]) CM/SW Contact:    Justina Delcia Czar, RN Phone Number: [phone] 01/23/2024, 7:00 PM  Clinical Narrative:                 Spoke to pt's son, [name]. Pt lives at home alone. Has RW and cane at home. Was independent pta. Son states he checks on pt daily works close to his home. Will need PT/OT evaluation and recommendation.   May need Home IV abx, updated [employer] rep, Pam for possible Home IV abx.   Expected Discharge Plan: Home/Self Care Barriers to Discharge: Continued Medical Work up   Patient Goals and CMS Choice            Expected Discharge Plan and Services   Discharge Planning Services: CM Consult   Living arrangements for the past 2 months: Single Family Home                                      Prior Living Arrangements/Services Living arrangements for the past 2 months: Single Family Home Lives with:: Self Patient language and need for interpreter reviewed:: Yes Do you feel safe going back to the place where you live?: Yes      Need for Family Participation in Patient Care: Yes (Comment) Care giver support system in place?: Yes (comment) Current home services: DME (roller walker, cane, scale) Criminal Activity/Legal Involvement Pertinent to Current Situation/Hospitalization: No - Comment as needed  Activities of Daily Living   ADL Screening (condition at time of admission) Independently performs ADLs?: Yes (appropriate for developmental age) Is the patient deaf or have difficulty hearing?: Yes Does the patient have difficulty seeing, even when wearing glasses/contacts?: No Does the patient have difficulty concentrating, remembering, or making decisions?: No  Permission Sought/Granted Permission sought to share information with : Case Manager Permission granted to share information with :  Yes, Verbal Permission Granted  Share Information with NAME: [name] Goldin  Permission granted to share info w AGENCY: Home Health, PCP, DME  Permission granted to share info w Relationship: son  Permission granted to share info w Contact Information: [phone]  Emotional Assessment              Admission diagnosis:  Esophageal rupture [K22.3] Abdominal pain, unspecified abdominal location [R10.9] Patient Active Problem List   Diagnosis Date Noted   Malnutrition of moderate degree 01/23/2024   Esophageal rupture 01/21/2024   Lobar pneumonia 07/10/2023   Need for management of chest tube 07/10/2023   Pleural effusion on right 07/10/2023   Aspiration pneumonia of right upper lobe (HCC) 07/09/2023   Pleural effusion 07/06/2023   Cardiac arrest (HCC) 06/26/2023   Gastritis 03/12/2023   Acute on chronic diastolic CHF (congestive heart failure) (HCC) 03/10/2023   Carotid artery stenosis 10/13/2022   Carotid artery stenosis, asymptomatic, right 10/13/2022   Asymptomatic carotid artery stenosis, left 07/28/2022   Biceps tendonitis on right    Tear of right supraspinatus tendon    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    PAF (paroxysmal atrial fibrillation) (HCC) 09/13/2018   Upper GI bleeding 07/27/2018   History of non-ST elevation myocardial infarction (NSTEMI) 06/15/2018   AKI (acute kidney injury) 04/11/2018   Gastrointestinal hemorrhage with  melena 04/11/2018   Acute blood loss anemia 04/11/2018   Metabolic acidosis, normal anion gap (NAG) 04/11/2018   Carotid artery disease 01/17/2018   Hyperlipidemia 01/17/2018   Peripheral neuropathy 12/25/2017   Hypertension 12/12/2017   Transient neurologic deficit 12/12/2017   History of CVA (cerebrovascular accident) 12/12/2017   Transient ischemic attack    Hypertensive 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 CHF 04/20/2017   Acute DVT (deep venous thrombosis) (HCC) 04/20/2017   Hematuria  04/20/2017   CKD stage 3b, GFR 30-44 ml/min (HCC) 04/20/2017   PCP:  Seabron Lenis, MD Pharmacy:   [facility] [phone] - [place],  - [address] AT [facility] [address] [place] [address] Phone: [phone] Fax: [phone]  [facility] [address] [place] [address] Phone: [phone] Fax: [phone]     Social Drivers of Health (SDOH) Social History: SDOH Screenings   Food Insecurity: No Food Insecurity (01/22/2024)  Housing: High Risk (01/22/2024)  Transportation Needs: No Transportation Needs (01/22/2024)  Utilities: Not At Risk (01/22/2024)  Depression (PHQ2-9): Low Risk  (12/01/2020)  Social Connections: Socially Isolated (01/22/2024)  Tobacco Use: Low Risk  (01/22/2024)   SDOH Interventions:     Readmission Risk Interventions    06/27/2023    1:52 PM 10/14/2022   11:29 AM  Readmission Risk Prevention Plan  Post Dischage Appt  Complete  Medication Screening  Complete  Transportation Screening Complete Complete  HRI or Home Care Consult Complete   Social Work Consult for Recovery Care Planning/Counseling Complete   Palliative Care Screening Not Applicable   Medication Review Oceanographer) Referral to Pharmacy

## 2024-01-23 NOTE — Progress Notes (Signed)
 Advanced Heart Failure Rounding Note  Cardiologist: Dorn Lesches, MD  Chief Complaint: S/P esophageal stent Subjective:   Denies pain.    Objective:   Weight Range: 75.5 kg Body mass index is 21.96 kg/m.   Vital Signs:   Temp:  [97 F (36.1 C)-99.1 F (37.3 C)] 97.4 F (36.3 C) (10/07 0700) Pulse Rate:  [76-97] 97 (10/07 0900) Resp:  [15-28] 22 (10/07 0900) BP: (86-160)/(44-81) 156/75 (10/07 0900) SpO2:  [86 %-100 %] 96 % (10/07 0900) Weight:  [72.8 kg-75.5 kg] 75.5 kg (10/07 0500) Last BM Date : 01/22/24  Weight change: Filed Weights   01/22/24 0701 01/22/24 1140 01/23/24 0500  Weight: 72.8 kg 72.8 kg 75.5 kg    Intake/Output:   Intake/Output Summary (Last 24 hours) at 01/23/2024 1028 Last data filed at 01/23/2024 0900 Gross per 24 hour  Intake 2109.35 ml  Output 1450 ml  Net 659.35 ml      Physical Exam   General:   No resp difficulty Neck: no JVD.  Cor: Regular rate & rhythm.  Lungs: clear Abdomen: soft, nontender, nondistended.  Extremities: no  edema Neuro: alert & oriented x3   Telemetry   SR   EKG  N/A   Labs    CBC Recent Labs    01/21/24 1439 01/21/24 1500 01/22/24 0215  WBC 9.5  --  6.2  NEUTROABS 8.1*  --   --   HGB 14.8 15.3 13.5  HCT 47.9 45.0 44.5  MCV 89.9  --  90.6  PLT 208  --  151   Basic Metabolic Panel Recent Labs    89/93/74 0816 01/22/24 1611 01/22/24 2035 01/23/24 0019 01/23/24 0430  NA 140  --  142  --  142  K 5.8*   < > 5.6* 5.8* 5.5*  CL 107  --  108  --  106  CO2 26  --  23  --  25  GLUCOSE 105*  --  140*  --  175*  BUN 54*  --  55*  --  56*  CREATININE 2.69*  --  2.71*  --  2.68*  CALCIUM  8.5*  --  9.8  --  9.2  MG 2.5*  --   --   --  2.4  PHOS 4.0  --   --   --  2.8   < > = values in this interval not displayed.   Liver Function Tests Recent Labs    01/21/24 1551  AST 21  ALT 21  ALKPHOS 33*  BILITOT 0.5  PROT 3.8*  ALBUMIN  2.0*   Recent Labs    01/21/24 1551  LIPASE 55*    Cardiac Enzymes No results for input(s): CKTOTAL, CKMB, CKMBINDEX, TROPONINI in the last 72 hours.  BNP: BNP (last 3 results) Recent Labs    03/09/23 1544 03/11/23 0218 06/27/23 1116  BNP 660.9* 491.3* 646.4*    ProBNP (last 3 results) No results for input(s): PROBNP in the last 8760 hours.   D-Dimer No results for input(s): DDIMER in the last 72 hours. Hemoglobin A1C No results for input(s): HGBA1C in the last 72 hours. Fasting Lipid Panel Recent Labs    01/22/24 1611  TRIG 38   Thyroid  Function Tests No results for input(s): TSH, T4TOTAL, T3FREE, THYROIDAB in the last 72 hours.  Invalid input(s): FREET3  Other results:   Imaging    DG C-Arm 1-60 Min-No Report Result Date: 01/22/2024 Fluoroscopy was utilized by the requesting physician.  No radiographic interpretation.  US  EKG SITE RITE Result Date: 01/22/2024 If Site Rite image not attached, placement could not be confirmed due to current cardiac rhythm.    Medications:     Scheduled Medications:  bisacodyl   10 mg Rectal Q0600   Chlorhexidine  Gluconate Cloth  6 each Topical Daily   insulin  aspart  10 Units Intravenous Once   And   dextrose   1 ampule Intravenous Once   insulin  aspart  0-6 Units Subcutaneous Q4H   mupirocin ointment  1 Application Nasal BID   pantoprazole  (PROTONIX ) IV  40 mg Intravenous QHS   sodium bicarbonate   50 mEq Intravenous Once   sodium chloride  flush  10-40 mL Intracatheter Q12H   thiamine (VITAMIN B1) injection  100 mg Intravenous Daily    Infusions:  calcium  gluconate     famotidine (PEPCID) IV 20 mg (01/23/24 1011)   fluconazole (DIFLUCAN) IV 200 mg (01/23/24 1010)   lactated ringers  40 mL/hr at 01/23/24 0900   piperacillin -tazobactam (ZOSYN )  IV 12.5 mL/hr at 01/23/24 0900   TPN ADULT (ION) 35 mL/hr at 01/23/24 0900    PRN Medications: HYDROmorphone  (DILAUDID ) injection, mouth rinse, sodium chloride  flush    Patient Profile     Patient with longstanding history of chronic heart failure with preserved ejection fraction, RV failure with history of pulmonary embolism presents with esophageal perforation.  S/P Stent Esophageal Stent    Assessment/Plan  1.  Esophageal Perforation - CT C/A/P: with pneumomediastinum and large mass-like density in lower esophagus - Prior EGD 3/25: erosive gastritis, duodenal deformity, and bleeding friable duodenal mucosa s/p hemostatic 01/23/24 S/P EGD with esophageal stent placed  On TPN per Surgery. NPO  2. Chronic HFpEF: h/o cardiogenic shock with RV failure in the setting of cardiac arrest thought to be caused by acute PE vs ACS in 3/25. Echo at the time showed EF 55%, severe, LVH, G2DD, and severely reduced RV function with strain. There was concern for cardiac sarcoid amyloid, however CMR LGE consistent with ischemic disease. RV on echo 9/25 with complete recovery, EF 60-65%, nl RV function.    - RHC 3/25: Mild PAH with severe RV failure though CO is preserved.   - Stable from HF perspective.  -Restart po meds when ok with surgery. PTA he was taking entresto  97-103 twice a day.  - Add IV hydralazine  until he can swallow.    3. mvCAD:  - LHC 3/25: 3v CAD with CTO RCA with L->R collaterals and moderate non-obstructive CAD in L system   4.  AKI on CKD Stage 3b: Baseline sCr ~ 2.1. - 2.3 on admit, up to 2.7 today  _ Follow    5. PAF:   - s/p TEE/DCCV 3/25 - PO meds on hold - If developed AF, will need IV amio - ? May need to add heparin  drip.    6.  H/o DVT/PE:  Bilateral upper and lower DVTs, mixed acute and chronic.  - s/p infrarenal IVC placement 3/25 - V/Q 3/25 with multiple perfusion defects - will need lifelong AC, on hold as above   7. Carotid stenosis: h/o CVA.  TCAR in 6/24. Okay to remain off plavix  at this point. - carotid US  9/25: widely patent carotid stent on R and no stenosis on L    From HF perspective stable.    Length of Stay: 2  Greig Mosses, NP   01/23/2024, 10:28 AM  Advanced Heart Failure Team Pager [phone] (M-F; 7a - 5p)  Please contact [facility] for night-coverage after hours (  5p -7a ) and weekends on [email or web address]

## 2024-01-23 NOTE — Progress Notes (Signed)
   01/23/24 1451  Vitals  Temp 97.7 F (36.5 C)  Temp Source Oral  BP 115/71  BP Location Left Arm  BP Method Automatic  Patient Position (if appropriate) Lying  Pulse Rate 74  Pulse Rate Source Dinamap  Resp 17  Level of Consciousness  Level of Consciousness Alert  MEWS COLOR  MEWS Score Color Green  Oxygen  Therapy  SpO2 94 %  O2 Device Room Air  Pain Assessment  Pain Scale 0-10  Pain Score 0  MEWS Score  MEWS Temp 0  MEWS Systolic 0  MEWS Pulse 0  MEWS RR 0  MEWS LOC 0  MEWS Score 0
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
## 2024-01-23 NOTE — Progress Notes (Signed)
 Pt arrived to 6 north room 1 via Programmer, applications. Alert and oriented x4. Pain level 0/10. Tele box 6N04 placed on pt. TPN running at 35 LR running at 10, Heparin  at 5. Bed in lowest position. Call light in reach. Bed alarm on. All needs met at this time.

## 2024-01-23 NOTE — Progress Notes (Signed)
 [facility] PROGRESS NOTE  Billy [name] (DOB: [birth year]) FMW:[id number] PCP: Seabron Lenis, MD  Brief Narrative: Billy [name] is a 73 y.o. male with a history of HFrEF, AFib, CAD, PE complicated by cardiogenic shock who presented to the ED on 01/21/2024 with several days of vomiting followed by an episode of severe tearing epigastric pain. He was found to have an esophageal tear. GI and cardiothoracic surgery were consulted, took for EGD with stent placement 10/7.   Subjective: Having severe pain in epigastrium radiating to both sides and back, overall significantly improved from arrival but still bad. Nausea with contrast, but no worse pain. Dilaudid  helping.   Objective: BP 115/71 (BP Location: Left Arm)   Pulse 74   Temp 97.7 F (36.5 C) (Oral)   Resp 17   Ht 6' 1 (1.854 m)   Wt 75.5 kg   SpO2 94%   BMI 21.96 kg/m   Gen: No distress Pulm: Clear, nonlabored  CV: RRR, no MRG GI: Soft but distended without rebound, +epigastric tenderness to palpation, +BS.  Neuro: Alert and oriented. No new focal deficits. 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 Shyrl 10/7, remaining strict NPO. Has PICC for TPN in the meantime.  - Continue zosyn , fluconazole empirically, plan 6 week course.  - Continue pain control.   - Will discuss initiation of clears with CT surgery after esophagram    AKI on CKD3b - Decrease IVF now that he's on TPN.   PAF, history of DVT/PE:  - Restart anticoagulation with IV heparin . Cleared by CT surgery.   Chronic HFrEF - Will continue loop diuretic dosing with IV lasix . Because of this resultant frequent urination and while hooked up to IV continuously, he requests purewick. Unable to us  urinal reliably. Will allow this while on IV, anticipate discontinuing 10/8. - HF team following   Hyperkalemia:  - Hold  entresto .  - Repeat lasix  IV, given bicarb and calcium  - Remain on telemetry - Monitor serially (resolved this PM).     [name], MD [facility] [email or web address] 01/23/2024, 3:14 PM

## 2024-01-23 NOTE — Progress Notes (Signed)
 Tele box 6 north 04 called and verified for pt.

## 2024-01-23 NOTE — Progress Notes (Signed)
 Order placed for IV team blood draw from PICC

## 2024-01-23 NOTE — Progress Notes (Signed)
 PHARMACY - TOTAL PARENTERAL NUTRITION CONSULT NOTE   Indication: intolerance to enteral feedings, esophageal performation  Patient Measurements: Height: 6' 1 (185.4 cm) Weight: 75.5 kg (166 lb 7.2 oz) IBW/kg (Calculated) : 79.9 TPN AdjBW (KG): 72.8 Body mass index is 21.96 kg/m.  Assessment:  73 year old man with medical history significant for cardiac arrest earlier this year who was admitted after several days of nausea and vomiting from a stomach bug followed by sudden onset severe tearing epigastric pain radiating to back and chest. 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 Pharmacy consulted to initiate TPN due to inability to tolerate enteral nutrition at this time.  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 PTA takes torsemide  20 mg PO daily, now held. Aggressive fluid resuscitation recommended per GI. Per HF, patient currently euvolemic but will hold diuretic for now. Due to CHF will target lower end of fluid range for TPN and supplement additional fluids per MD outside of TPN to prevent fluid overload.   Stent placed on 10/6, patient getting an esophagram today. Pending results potential to start clear liquid diet.   Glucose / Insulin : A1C (02/2023) 6.0; CBGs 111-183, sSSI (1u/24h) s/p 4mg  dexamethasone  10/6  Electrolytes: K 5.5 (none in TPN, Kayexalate x1 10/6), Na 142, Mg 25, Phos 2.8, CoCa 10.8 (s/p 2g calcium  gluconate), others wnl Renal: Scr stable 2.68 (bsl ~2), BUN 56 Hepatic: AST/ALT wnl, tbili 0.5, alb 2 Intake / Output; MIVF: UOP charted 750 mL, LBM PTA (charted as 10/04), LR 40cc/hr, lasix  40mg  x1 for hyperK per MD, net + 3.3L   GI Imaging: 10/06 Esophagogram/barium study: distal esophageal perforation 10/05 CT: concerning for esophageal perforation, masslike area of soft tissue thickening adjacent to and surrounding the esophagus - differential considerations remain esophageal mass, hiatal hernia, or infection GI  Surgeries / Procedures:  10/6: esophageal stent placement   Central access: 01/22/24 TPN start date: 01/22/24  Nutritional Goals: Goal TPN rate is 80 mL/hr (provides ~110 g of protein and 2101 kcals per day)  RD Assessment: pending Estimated Needs Total Energy Estimated Needs: 2100-2300 kcals Total Protein Estimated Needs: 110-130 g Total Fluid Estimated Needs: 2L  Current Nutrition:  NPO 10/05 >> TPN (10/06) >>  Plan:  Advance TPN to goal 80 mL/hr at 1800 to meet 100% total nutritional needs Electrolytes in TPN: reduce Na 25 mEq/L, K 0 mEq/L, remove Ca 0 mEq/L, Mg 0 mEq/L, and Phos 5 mmol/L. Cl:Ac 1:2 Add standard MVI and trace elements to TPN Change very sensitive sliding scale to  Moderate q4h SSI and adjust as needed  Add thiamine 100 mg IV x5 days per RD recommendation - ordered outside of TPN 10/6 but not given, will add on 10/7 for EOT of 10/11.  Messaged [name] to evaluate need for LR, expiring this afternoon but noted diuresis ordered this AM. Lasix  for hyperK per provider, will hold off on concentrating TPN.  Monitor TPN labs on Mon/Thurs, daily while initiating TPN F/u ability to advance diet pending esophogram results.   Thank you for allowing pharmacy to be a part of this patient's care.  Powell Blush, PharmD, BCCCP  Clinical Pharmacist
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
## 2024-01-24 DIAGNOSIS — K223 Perforation of esophagus: Secondary | ICD-10-CM | POA: Diagnosis not present

## 2024-01-24 DIAGNOSIS — Z9889 Other specified postprocedural states: Secondary | ICD-10-CM | POA: Diagnosis not present

## 2024-01-24 DIAGNOSIS — R042 Hemoptysis: Secondary | ICD-10-CM | POA: Diagnosis not present

## 2024-01-24 LAB — CBC
HCT: 34.4 % — ABNORMAL LOW (ref 39.0–52.0)
Hemoglobin: 10.8 g/dL — ABNORMAL LOW (ref 13.0–17.0)
MCH: 27.7 pg (ref 26.0–34.0)
MCHC: 31.4 g/dL (ref 30.0–36.0)
MCV: 88.2 fL (ref 80.0–100.0)
Platelets: 186 K/uL (ref 150–400)
RBC: 3.9 MIL/uL — ABNORMAL LOW (ref 4.22–5.81)
RDW: 18.6 % — ABNORMAL HIGH (ref 11.5–15.5)
WBC: 8.7 K/uL (ref 4.0–10.5)
nRBC: 0.2 % (ref 0.0–0.2)

## 2024-01-24 LAB — GLUCOSE, CAPILLARY
Glucose-Capillary: 134 mg/dL — ABNORMAL HIGH (ref 70–99)
Glucose-Capillary: 144 mg/dL — ABNORMAL HIGH (ref 70–99)
Glucose-Capillary: 144 mg/dL — ABNORMAL HIGH (ref 70–99)
Glucose-Capillary: 165 mg/dL — ABNORMAL HIGH (ref 70–99)
Glucose-Capillary: 177 mg/dL — ABNORMAL HIGH (ref 70–99)
Glucose-Capillary: 182 mg/dL — ABNORMAL HIGH (ref 70–99)
Glucose-Capillary: 192 mg/dL — ABNORMAL HIGH (ref 70–99)

## 2024-01-24 LAB — BASIC METABOLIC PANEL WITH GFR
Anion gap: 8 (ref 5–15)
BUN: 57 mg/dL — ABNORMAL HIGH (ref 8–23)
CO2: 26 mmol/L (ref 22–32)
Calcium: 8.9 mg/dL (ref 8.9–10.3)
Chloride: 106 mmol/L (ref 98–111)
Creatinine, Ser: 2.42 mg/dL — ABNORMAL HIGH (ref 0.61–1.24)
GFR, Estimated: 28 mL/min — ABNORMAL LOW (ref >60)
Glucose, Bld: 171 mg/dL — ABNORMAL HIGH (ref 70–99)
Potassium: 3.9 mmol/L (ref 3.5–5.1)
Sodium: 140 mmol/L (ref 135–145)

## 2024-01-24 LAB — PHOSPHORUS: Phosphorus: 1.7 mg/dL — ABNORMAL LOW (ref 2.5–4.6)

## 2024-01-24 LAB — APTT: aPTT: 38 s — ABNORMAL HIGH (ref 24–36)

## 2024-01-24 LAB — HEPARIN LEVEL (UNFRACTIONATED): Heparin Unfractionated: >1.1 [IU]/mL — ABNORMAL HIGH (ref 0.30–0.70)

## 2024-01-24 LAB — MAGNESIUM: Magnesium: 2.3 mg/dL (ref 1.7–2.4)

## 2024-01-24 MED ORDER — SODIUM PHOSPHATES 45 MMOLE/15ML IV SOLN
30.0000 mmol | Freq: Once | INTRAVENOUS | Status: AC
  Administered 2024-01-24: 30 mmol via INTRAVENOUS
  Filled 2024-01-24: qty 10

## 2024-01-24 MED ORDER — OXYCODONE HCL 5 MG PO TABS
5.0000 mg | ORAL_TABLET | ORAL | Status: DC | PRN
  Administered 2024-01-24 – 2024-01-31 (×23): 10 mg via ORAL
  Filled 2024-01-24 (×23): qty 2

## 2024-01-24 MED ORDER — METHOCARBAMOL 1000 MG/10ML IJ SOLN
500.0000 mg | Freq: Three times a day (TID) | INTRAMUSCULAR | Status: DC | PRN
  Administered 2024-01-24 (×2): 500 mg via INTRAVENOUS
  Filled 2024-01-24 (×2): qty 10

## 2024-01-24 MED ORDER — TRAVASOL 10 % IV SOLN
INTRAVENOUS | Status: AC
  Filled 2024-01-24: qty 1100.2
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
## 2024-01-24 NOTE — Progress Notes (Signed)
 PROGRESS NOTE  Billy [name] FMW:[id number] DOB: Aug 21, 1950 DOA: 01/21/2024 PCP: Seabron Lenis, MD   LOS: 3 days   Brief Narrative / Interim history: Billy [name] is a 73 y.o. male with a history of HFrEF, AFib, CAD, PE complicated by cardiogenic shock who presented to the ED on 01/21/2024 with several days of vomiting followed by an episode of severe tearing epigastric pain. He was found to have an esophageal tear. GI and cardiothoracic surgery were consulted, took for EGD with stent placement 10/7.   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 Shyrl 10/7.  Evaluated by cardiothoracic surgery this morning, recent swallow study did not show any leak, and okay to start clear liquids.  Closely monitor, continue TPN as well - He is on IV antibiotics, Zosyn , fluconazole, empirically for 6 weeks per cardiothoracic surgery.     Active problems AKI on CKD3b -baseline creatinine in the low 2s, currently at baseline   PAF, history of DVT/PE - Restart anticoagulation with IV heparin  once cleared by CT surgery, however on hold this morning due to coughing up blood.     Chronic HFrEF -heart failure team following, eventually needs to resume home heart failure medications prior to discharge, but for now they are on hold.   Hyperkalemia - Hold entresto .  Received Lasix , bicarb and calcium .  Potassium 3.9 today  Scheduled Meds:  bisacodyl   10 mg Rectal Q0600   Chlorhexidine  Gluconate Cloth  6 each Topical Daily   insulin  aspart  10 Units Intravenous Once   And   dextrose   1 ampule Intravenous Once   insulin  aspart  0-15 Units Subcutaneous Q4H   mupirocin ointment  1 Application Nasal BID   pantoprazole  (PROTONIX ) IV  40 mg Intravenous QHS   sodium bicarbonate   50 mEq Intravenous Once   sodium chloride  flush  10-40 mL  Intracatheter Q12H   Continuous Infusions:  calcium  gluconate     famotidine (PEPCID) IV 20 mg (01/24/24 9062)   fluconazole (DIFLUCAN) IV 200 mg (01/24/24 1015)   heparin  Stopped (01/23/24 1419)   piperacillin -tazobactam (ZOSYN )  IV 3.375 g (01/24/24 0552)   sodium PHOSPHATE  IVPB (in mmol) 30 mmol (01/24/24 0944)   TPN ADULT (ION) 80 mL/hr at 01/24/24 0302   TPN ADULT (ION)     PRN Meds:.HYDROmorphone  (DILAUDID ) injection, methocarbamol  (ROBAXIN ) injection, mouth rinse, sodium chloride  flush  Current Outpatient Medications  Medication Instructions   apixaban  (ELIQUIS ) 5 mg, Oral, 2 times daily   atorvastatin  (LIPITOR ) 80 mg, Oral, Daily   ezetimibe  (ZETIA ) 10 mg, Oral, Daily   ibuprofen (ADVIL) 400-600 mg, Oral, 2 times daily PRN   Multiple Vitamins-Minerals (MENS 50+ MULTIVITAMIN) TABS 1 tablet, Oral, Daily   sacubitril -valsartan  (ENTRESTO ) 97-103 MG 1 tablet, Oral, 2 times daily   torsemide  (DEMADEX ) 20 mg, Oral, Daily    Diet Orders (From admission, onward)     Start     Ordered   01/24/24 0852  Diet clear liquid Room service appropriate? Yes; Fluid consistency: Thin  Diet effective now       Question Answer Comment  Room service appropriate? Yes   Fluid consistency: Thin      01/24/24 0851            DVT prophylaxis: SCDs Start: 01/21/24 2023   Lab Results  Component Value Date   PLT 186 01/24/2024  Code Status: Full Code  Family Communication: No family at bedside  Status is: Inpatient Remains inpatient appropriate because: Severity of illness    Level of care: Telemetry Surgical  Consultants:  Cardiothoracic surgery Cardiology  Objective: Vitals:   01/24/24 0130 01/24/24 0456 01/24/24 0500 01/24/24 1026  BP: (!) 140/72 131/75  123/62  Pulse:    62  Resp:  17  18  Temp: 98 F (36.7 C) 98.2 F (36.8 C)  98.1 F (36.7 C)  TempSrc: Oral Oral  Oral  SpO2:  90%    Weight:   78.5 kg   Height:        Intake/Output Summary (Last 24  hours) at 01/24/2024 1139 Last data filed at 01/24/2024 0302 Gross per 24 hour  Intake 943.46 ml  Output 900 ml  Net 43.46 ml   Wt Readings from Last 3 Encounters:  01/24/24 78.5 kg  12/19/23 75.7 kg  09/27/23 74.8 kg    Examination:  Constitutional: NAD Eyes: no scleral icterus ENMT: Mucous membranes are moist.  Neck: normal, supple Respiratory: clear to auscultation bilaterally, no wheezing, no crackles. Cardiovascular: Regular rate and rhythm, no murmurs / rubs / gallops.  Abdomen: non distended, no tenderness. Bowel sounds positive.  Musculoskeletal: no clubbing / cyanosis.    Data Reviewed: I have independently reviewed following labs and imaging studies   CBC Recent Labs  Lab 01/21/24 1439 01/21/24 1500 01/22/24 0215 01/24/24 0208  WBC 9.5  --  6.2 8.7  HGB 14.8 15.3 13.5 10.8*  HCT 47.9 45.0 44.5 34.4*  PLT 208  --  151 186  MCV 89.9  --  90.6 88.2  MCH 27.8  --  27.5 27.7  MCHC 30.9  --  30.3 31.4  RDW 18.0*  --  18.0* 18.6*  LYMPHSABS 0.7  --   --   --   MONOABS 0.6  --   --   --   EOSABS 0.1  --   --   --   BASOSABS 0.0  --   --   --     Recent Labs  Lab 01/21/24 1551 01/21/24 1728 01/22/24 0816 01/22/24 1611 01/22/24 2035 01/23/24 0019 01/23/24 0430 01/23/24 1041 01/23/24 1202 01/23/24 1931 01/24/24 0208  NA 143 139 140  --  142  --  142  --   --   --  140  K 2.8* 3.8 5.8*   < > 5.6* 5.8* 5.5* 4.8  --  4.1 3.9  CL 118* 104 107  --  108  --  106  --   --   --  106  CO2 17* 23 26  --  23  --  25  --   --   --  26  GLUCOSE 165* 210* 105*  --  140*  --  175*  --   --   --  171*  BUN 37* 49* 54*  --  55*  --  56*  --   --   --  57*  CREATININE 1.43* 2.15* 2.69*  --  2.71*  --  2.68*  --   --   --  2.42*  CALCIUM  5.5* 8.2* 8.5*  --  9.8  --  9.2  --   --   --  8.9  AST 21  --   --   --   --   --   --   --   --   --   --   ALT 21  --   --   --   --   --   --   --   --   --   --  ALKPHOS 33*  --   --   --   --   --   --   --   --   --   --    BILITOT 0.5  --   --   --   --   --   --   --   --   --   --   ALBUMIN  2.0*  --   --   --   --   --   --   --   --   --   --   MG  --   --  2.5*  --   --   --  2.4  --   --   --  2.3  INR  --  1.3*  --   --   --   --   --   --   --   --   --   HGBA1C  --   --   --   --   --   --   --   --  6.0*  --   --    < > = values in this interval not displayed.    ------------------------------------------------------------------------------------------------------------------ Recent Labs    01/22/24 1611  TRIG 38    Lab Results  Component Value Date   HGBA1C 6.0 (H) 01/23/2024   ------------------------------------------------------------------------------------------------------------------ No results for input(s): TSH, T4TOTAL, T3FREE, THYROIDAB in the last 72 hours.  Invalid input(s): FREET3  Cardiac Enzymes No results for input(s): CKMB, TROPONINI, MYOGLOBIN in the last 168 hours.  Invalid input(s): CK ------------------------------------------------------------------------------------------------------------------    Component Value Date/Time   BNP 646.4 (H) 06/27/2023 1116    CBG: Recent Labs  Lab 01/23/24 1645 01/23/24 2003 01/24/24 0021 01/24/24 0423 01/24/24 0847  GLUCAP 137* 155* 192* 177* 182*    Recent Results (from the past 240 hours)  MRSA Next Gen by PCR, Nasal     Status: Abnormal   Collection Time: 01/21/24  9:27 PM   Specimen: Nasal Mucosa; Nasal Swab  Result Value Ref Range Status   MRSA by PCR Next Gen DETECTED (A) NOT DETECTED Final    Comment: RESULT CALLED TO, READ BACK BY AND VERIFIED WITH: J MCBRIDE RN 01/21/2024 @ 2334  (NOTE) The GeneXpert MRSA Assay (FDA approved for NASAL specimens only), is one component of a comprehensive MRSA colonization surveillance program. It is not intended to diagnose MRSA infection nor to guide or monitor treatment for MRSA infections. Test performance is not FDA approved in patients less than 17  years old. Performed at [facility], [address] [place], [place] [address]      Radiology Studies: No results found.   Nilda Fendt, MD, PhD [facility]  Between 7 am - 7 pm I am available, please contact me via Amion (for emergencies) or Securechat (non urgent messages)  Between 7 pm - 7 am I am not available, please contact night coverage MD/APP via Amion

## 2024-01-24 NOTE — Progress Notes (Signed)
 PHARMACY - TOTAL PARENTERAL NUTRITION CONSULT NOTE   Indication: intolerance to enteral feedings, esophageal performation  Patient Measurements: Height: 6' 1 (185.4 cm) Weight: 78.5 kg (173 lb 1 oz) IBW/kg (Calculated) : 79.9 TPN AdjBW (KG): 72.8 Body mass index is 22.83 kg/m.  Assessment:  73 year old man with medical history significant for cardiac arrest earlier this year who was admitted after several days of nausea and vomiting from a stomach bug followed by sudden onset severe tearing epigastric pain radiating to back and chest. 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 Pharmacy consulted to initiate TPN due to inability to tolerate enteral nutrition at this time.  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 PTA takes torsemide  20 mg PO daily, now held. Aggressive fluid resuscitation recommended per GI. Per HF, patient currently euvolemic but will hold diuretic for now. Due to CHF will target lower end of fluid range for TPN and supplement additional fluids per MD outside of TPN to prevent fluid overload.   Stent placed on 10/6, esophogram not showing any leak. Diet advanced to CLD.   Glucose / Insulin : A1C (02/2023) 6.0; CBGs 137-192, mSSI (15u/24h) s/p 4mg  dexamethasone  10/6  Electrolytes: K 3.9 (none in TPN, Kayexalate x1 10/6), Na 140, Mg 2.3, Phos 1.7, CoCa 10.5 (s/p 2g calcium  gluconate 10/6), others wnl Renal: Scr downtrending 2.42 (bsl ~2), BUN 57 Hepatic: AST/ALT wnl, tbili 0.5, alb 2 Intake / Output; MIVF: UOP charted 1.3 mL/kg/hr, LBM PTA (charted as 10/04), lasix  40mg  x1 for hyperK per MD 10/7, net + 2.7L   GI Imaging: 10/06 Esophagogram/barium study: distal esophageal perforation 10/05 CT: concerning for esophageal perforation, masslike area of soft tissue thickening adjacent to and surrounding the esophagus - differential considerations remain esophageal mass, hiatal hernia, or infection GI Surgeries / Procedures:  10/6:  esophageal stent placement   Central access: 01/22/24 TPN start date: 01/22/24  Nutritional Goals: Goal TPN rate is 80 mL/hr (provides ~110 g of protein and 2101 kcals per day)  RD Assessment: pending Estimated Needs Total Energy Estimated Needs: 2100-2300 kcals Total Protein Estimated Needs: 110-130 g Total Fluid Estimated Needs: 2L  Current Nutrition:  NPO 10/05 >>10/8 CLD 10/8 >>  TPN (10/06) >>  Plan:  Advance TPN to goal 80 mL/hr at 1800 to meet 100% total nutritional needs. Monitor volume status, low threshold to concentrate TPN given history of HF.  Electrolytes in TPN: Na 25 mEq/L, K 0 mEq/L, Ca 0 mEq/L, Mg 0 mEq/L, and increase Phos 10 mmol/L. Cl:Ac 1:2 30mmol NaPhos this AM. Will trend K for another 24 hours before adding, improvement in hyperkalemia this AM likely due from diuresis on 10/8. Noted improvement in AKI.  Add standard MVI and trace elements to TPN Continue  Moderate q4h SSI and adjust as needed - 1x elevation of CBG likely due to steroid given on 10/6.  Add thiamine 100 mg IV x5 days per RD recommendation, EOT of 10/11.  Monitor TPN labs on Mon/Thurs, daily while initiating TPN   Thank you for allowing pharmacy to be a part of this patient's care.  Powell Blush, PharmD, BCCCP  Clinical Pharmacist

## 2024-01-24 NOTE — Progress Notes (Signed)
 2 Days Post-Op Procedure(s) (LRB): EGD (ESOPHAGOGASTRODUODENOSCOPY) WITH STENT PLACEMENT (N/A) Subjective: Some hemoptysis, some pain issues persist  Objective: Vital signs in last 24 hours: Temp:  [97.1 F (36.2 C)-100.2 F (37.9 C)] 98.2 F (36.8 C) (10/08 0456) Pulse Rate:  [74-104] 103 (10/07 2026) Cardiac Rhythm: Atrial fibrillation;Bundle branch block (10/07 2049) Resp:  [15-22] 17 (10/08 0456) BP: (108-156)/(57-84) 131/75 (10/08 0456) SpO2:  [90 %-98 %] 90 % (10/08 0456) Weight:  [78.5 kg] 78.5 kg (10/08 0500)  Hemodynamic parameters for last 24 hours:    Intake/Output from previous day: 10/07 0701 - 10/08 0700 In: 1355.9 [I.V.:1057.4; IV Piggyback:298.5] Out: 1950 [Urine:1950] Intake/Output this shift: No intake/output data recorded.  General appearance: alert, cooperative, and no distress Heart: regular rate and rhythm Lungs: clear to auscultation bilaterally Abdomen: benign  Lab Results: Recent Labs    01/22/24 0215 01/24/24 0208  WBC 6.2 8.7  HGB 13.5 10.8*  HCT 44.5 34.4*  PLT 151 186   BMET:  Recent Labs    01/23/24 0430 01/23/24 1041 01/23/24 1931 01/24/24 0208  NA 142  --   --  140  K 5.5*   < > 4.1 3.9  CL 106  --   --  106  CO2 25  --   --  26  GLUCOSE 175*  --   --  171*  BUN 56*  --   --  57*  CREATININE 2.68*  --   --  2.42*  CALCIUM  9.2  --   --  8.9   < > = values in this interval not displayed.    PT/INR:  Recent Labs    01/21/24 1728  LABPROT 16.7*  INR 1.3*   ABG    Component Value Date/Time   PHART 7.395 07/05/2023 1646   HCO3 20.1 07/05/2023 1646   TCO2 23 01/21/2024 1500   ACIDBASEDEF 4.0 (H) 07/05/2023 1646   O2SAT 80.9 07/18/2023 0420   CBG (last 3)  Recent Labs    01/23/24 2003 01/24/24 0021 01/24/24 0423  GLUCAP 155* 192* 177*    Meds Scheduled Meds:  bisacodyl   10 mg Rectal Q0600   Chlorhexidine  Gluconate Cloth  6 each Topical Daily   insulin  aspart  10 Units Intravenous Once   And   dextrose    1 ampule Intravenous Once   insulin  aspart  0-15 Units Subcutaneous Q4H   lidocaine   1 patch Transdermal Q24H   mupirocin ointment  1 Application Nasal BID   pantoprazole  (PROTONIX ) IV  40 mg Intravenous QHS   sodium bicarbonate   50 mEq Intravenous Once   sodium chloride  flush  10-40 mL Intracatheter Q12H   Continuous Infusions:  calcium  gluconate     famotidine (PEPCID) IV Stopped (01/23/24 2327)   fluconazole (DIFLUCAN) IV Stopped (01/23/24 1047)   heparin  Stopped (01/23/24 1419)   piperacillin -tazobactam (ZOSYN )  IV 3.375 g (01/24/24 0552)   sodium PHOSPHATE  IVPB (in mmol)     TPN ADULT (ION) 80 mL/hr at 01/24/24 0302   PRN Meds:.HYDROmorphone  (DILAUDID ) injection, mouth rinse, sodium chloride  flush  Xrays DG ESOPHAGUS W SINGLE CM (SOL OR THIN BA) Result Date: 01/23/2024 CLINICAL DATA:  Patient with history of distal esophageal perforation with stent coverage yesterday returning for imaging to evaluate for leak around stent. EXAM: ESOPHAGUS/BARIUM SWALLOW/TABLET STUDY TECHNIQUE: Single contrast examination was performed using Omnipaque  300. This exam was performed by [place], NP, and was supervised and interpreted by Dr. Landy. FLUOROSCOPY: Radiation Exposure Index (as provided by the fluoroscopic device): 59.3  mGy Kerma COMPARISON:  01/22/24 DG ESO FINDINGS: Contrast passes through the esophagus into the stomach without evidence of extraluminal contrast. The stent associated with procedure yesterday for perforation is visualized and appears to continue from the distal esophagus into the proximal stomach. Patient mobility is improved today, and he is able to tolerate imaged obtained standing and supine in LPO and RPO positioning to achieve adequate filling of the esophagus and stent. Additional images in these positions show no concern for leakage around the stent. IMPRESSION: No leak associated with stent coverage of esophageal perforation seen during exam. Electronically Signed    By: Lynwood Landy Raddle M.D.   On: 01/23/2024 13:46   DG C-Arm 1-60 Min-No Report Result Date: 01/22/2024 Fluoroscopy was utilized by the requesting physician.  No radiographic interpretation.   US  EKG SITE RITE Result Date: 01/22/2024 If Site Rite image not attached, placement could not be confirmed due to current cardiac rhythm.  DG ESOPHAGUS W SINGLE CM (SOL OR THIN BA) Result Date: 01/22/2024 CLINICAL DATA:  Three day history of nausea and vomiting. Presented to ED yesterday with abdominal and chest pain. CT chest shows pneumomediastinum and masslike soft tissue thickening about the distal esophagus. Consult for water  soluble esophagram to rule out esophageal perforation. EXAM: ESOPHAGUS/BARIUM SWALLOW/TABLET STUDY TECHNIQUE: Single contrast examination was performed using water  soluble Omnipaque  300. This exam was performed by Kimble Clas, PA-C, and was supervised and interpreted by Dr. Dasie Hamburg. FLUOROSCOPY: Radiation Exposure Index (as provided by the fluoroscopic device): 19.5 mGy Kerma COMPARISON:  CT Angio C/A/P and CT chest from 01/21/24 FINDINGS: The study was limited secondary to decreased patient mobility and inability to stand. During the initial swallow, there is evidence of contrast extravasation outside of the normal confines of the distal esophagus just proximal to the GE junction. With additional swallows, there is progressive accumulation of contrast within a collection containing gas in the lower mediastinum to the left of the esophagus. These results will be called to the ordering clinician or representative by the Radiologist Assistant, and communication documented in the PACS or [employer]. IMPRESSION: Findings consistent with distal esophageal perforation. Electronically Signed   By: Dasie Hamburg M.D.   On: 01/22/2024 09:29    Assessment/Plan: S/P Procedure(s) (LRB): EGD (ESOPHAGOGASTRODUODENOSCOPY) WITH STENT PLACEMENT (N/A) POD#2  1 Tmax 100.2, conts BSA w/ zosyn   and diflucan,coughed up some blood clots- heparin  stopped(held) 2 O2 sats OK on RA 3 good UOP 4 swallow study- no leak, ok to start clear liquid diet/cont TPN 5 med management per primary     LOS: 3 days    Lemond FORBES Cera PA-C Pager [phone] 01/24/2024

## 2024-01-24 NOTE — Care Management Important Message (Signed)
 Important Message  Patient Details  Name: Billy [name] MRN: [id number] Date of Birth: [birth year]   Important Message Given:  Yes - Medicare IM     Jon Cruel 01/24/2024, 4:52 PM

## 2024-01-24 NOTE — Plan of Care (Signed)

## 2024-01-24 NOTE — Progress Notes (Signed)
   01/24/24 1042  Assess: MEWS Score  Level of Consciousness Alert  Assess: MEWS Score  MEWS Temp 0  MEWS Systolic 0  MEWS Pulse 2  MEWS RR 0  MEWS LOC 0  MEWS Score 2  MEWS Score Color Yellow  Assess: if the MEWS score is Yellow or Red  Were vital signs accurate and taken at a resting state? Yes  Does the patient meet 2 or more of the SIRS criteria? No  MEWS guidelines implemented  Yes, yellow  Treat  MEWS Interventions Considered administering scheduled or prn medications/treatments as ordered  Take Vital Signs  Increase Vital Sign Frequency  Yellow: Q2hr x1, continue Q4hrs until patient remains green for 12hrs  Escalate  MEWS: Escalate Yellow: Discuss with charge nurse and consider notifying provider and/or RRT  Notify: Charge Nurse/RN  Name of Charge Nurse/RN Notified Shiann T,RN  Provider Notification  Provider Name/Title Gherghe,MD  Date Provider Notified 01/24/24  Time Provider Notified 1051  Method of Notification Page  Notification Reason Other (Comment) (Yellow MEWS)  Provider response No new orders  Date of Provider Response 01/24/24  Time of Provider Response 1052

## 2024-01-24 NOTE — Evaluation (Signed)
 Occupational Therapy Evaluation Patient Details Name: Billy [name] MRN: [id number] DOB: 29-Jun-1950 Today's Date: 01/24/2024   History of Present Illness   Oz Gammel is a 73 y.o. male who presented to [facility] ED 01/21/24 with several days of vomiting followed by an episode of severe tearing epigastric pain. Work-up revealed esophageal tear. Pt s/p esophageal stent with EGD 10/7. PMHx: HFrEF, AFib, CAD, PE complicated by cardiogenic shock, CKD 4, MI, peripheral neuropathy, and TIA.     Clinical Impressions Pt is typically independent. Presents with generalized weakness, impaired standing balance and pain. Pt requires min assist for bed mobility, CGA to stand and ambulate with increased time and set up to max assist for ADLs. Recommending HHOT upon discharge. Pt reports his ex wife can assist as needed when he returns home.     If plan is discharge home, recommend the following:   A little help with walking and/or transfers;A lot of help with bathing/dressing/bathroom;Assistance with cooking/housework;Assist for transportation;Help with stairs or ramp for entrance     Functional Status Assessment   Patient has had a recent decline in their functional status and demonstrates the ability to make significant improvements in function in a reasonable and predictable amount of time.     Equipment Recommendations   Other (comment) (TBD)     Recommendations for Other Services         Precautions/Restrictions   Precautions Precautions: Fall Recall of Precautions/Restrictions: Intact Restrictions Weight Bearing Restrictions Per Provider Order: No     Mobility Bed Mobility Overal bed mobility: Needs Assistance Bed Mobility: Rolling, Sidelying to Sit Rolling: Min assist Sidelying to sit: Min assist, HOB elevated, Used rails       General bed mobility comments: increased time, cues for log roll technique to minimize pain    Transfers Overall transfer level: Needs  assistance Equipment used: Rolling walker (2 wheels) Transfers: Sit to/from Stand Sit to Stand: Contact guard assist           General transfer comment: cues for hand placement, increased time      Balance Overall balance assessment: Needs assistance   Sitting balance-Leahy Scale: Fair     Standing balance support: Bilateral upper extremity supported, During functional activity, Reliant on assistive device for balance Standing balance-Leahy Scale: Poor Standing balance comment: Pt dependent on RW                           ADL either performed or assessed with clinical judgement   ADL Overall ADL's : Needs assistance/impaired Eating/Feeding: Independent;Bed level   Grooming: Supervision/safety;Sitting   Upper Body Bathing: Minimal assistance;Sitting   Lower Body Bathing: Sit to/from stand;Maximal assistance   Upper Body Dressing : Minimal assistance;Sitting   Lower Body Dressing: Sit to/from stand;Maximal assistance   Toilet Transfer: Contact guard assist;Ambulation;Rolling walker (2 wheels)   Toileting- Clothing Manipulation and Hygiene: Sit to/from stand;Maximal assistance       Functional mobility during ADLs: Contact guard assist;Rolling walker (2 wheels)       Vision Baseline Vision/History: 1 Wears glasses Ability to See in Adequate Light: 0 Adequate Patient Visual Report: No change from baseline       Perception         Praxis         Pertinent Vitals/Pain Pain Assessment Pain Assessment: Faces Faces Pain Scale: Hurts whole lot Pain Location: L shoulder/chest, abdomen Pain Descriptors / Indicators: Discomfort, Aching, Sore Pain Intervention(s): RN gave pain meds during session,  Heat applied     Extremity/Trunk Assessment Upper Extremity Assessment Upper Extremity Assessment: Overall WFL for tasks assessed   Lower Extremity Assessment Lower Extremity Assessment: Defer to PT evaluation   Cervical / Trunk Assessment Cervical  / Trunk Assessment: Normal   Communication Communication Communication: No apparent difficulties   Cognition Arousal: Alert Behavior During Therapy: Anxious Cognition: No apparent impairments                               Following commands: Intact       Cueing  General Comments   Cueing Techniques: Verbal cues      Exercises     Shoulder Instructions      Home Living Family/patient expects to be discharged to:: Private residence Living Arrangements: Alone Available Help at Discharge: Family;Available PRN/intermittently Type of Home: House Home Access: Ramped entrance;Stairs to enter     Home Layout: One level     Bathroom Shower/Tub: Chief Strategy Officer: Standard     Home Equipment: Hand held Programmer, systems (2 wheels);Cane - single point   Additional Comments: ex wife can assist      Prior Functioning/Environment Prior Level of Function : Independent/Modified Independent;Driving                    OT Problem List: Decreased strength;Impaired balance (sitting and/or standing);Decreased knowledge of use of DME or AE;Pain   OT Treatment/Interventions: Self-care/ADL training;DME and/or AE instruction;Therapeutic activities;Patient/family education;Balance training      OT Goals(Current goals can be found in the care plan section)   Acute Rehab OT Goals OT Goal Formulation: With patient Time For Goal Achievement: 02/07/24 Potential to Achieve Goals: Good ADL Goals Pt Will Perform Grooming: with supervision;standing Pt Will Perform Lower Body Bathing: with supervision;sit to/from stand Pt Will Perform Lower Body Dressing: with supervision;sit to/from stand Pt Will Transfer to Toilet: with supervision;ambulating;regular height toilet Pt Will Perform Toileting - Clothing Manipulation and hygiene: with supervision;sit to/from stand Additional ADL Goal #1: Pt will complete bed mobility mod I in preparation for  ADLs.   OT Frequency:  Min 2X/week    Co-evaluation              AM-PAC OT 6 Clicks Daily Activity     Outcome Measure Help from another person eating meals?: None Help from another person taking care of personal grooming?: A Little Help from another person toileting, which includes using toliet, bedpan, or urinal?: A Little Help from another person bathing (including washing, rinsing, drying)?: A Lot Help from another person to put on and taking off regular upper body clothing?: A Little Help from another person to put on and taking off regular lower body clothing?: A Lot 6 Click Score: 17   End of Session Equipment Utilized During Treatment: Gait belt;Rolling walker (2 wheels)  Activity Tolerance: Patient limited by pain Patient left: in bed;with call bell/phone within reach;with bed alarm set  OT Visit Diagnosis: Unsteadiness on feet (R26.81);Other abnormalities of gait and mobility (R26.89);Pain;Muscle weakness (generalized) (M62.81)                Time: 1356-1430 OT Time Calculation (min): 34 min Charges:  OT General Charges $OT Visit: 1 Visit OT Evaluation $OT Eval Moderate Complexity: 1 Mod OT Treatments $Self Care/Home Management : 8-22 mins  Mliss HERO, OTR/L Acute Rehabilitation Services Office: [phone]  Kennth Mliss Helling 01/24/2024, 3:37 PM

## 2024-01-24 NOTE — Evaluation (Signed)
 Physical Therapy Evaluation Patient Details Name: Billy Billy [name] MRN: [id number] DOB: May 22, 1950 Today's Date: 01/24/2024  History of Present Illness  Billy Billy [name] is a 73 y.o. male who presented to [facility] ED 01/21/24 with several days of vomiting followed by an episode of severe tearing epigastric pain. Work-up revealed esophageal tear. Pt s/p esophageal stent with EGD 10/7. PMHx: HFrEF, AFib, CAD, PE complicated by cardiogenic shock, CKD 4, Billy [name], peripheral neuropathy, and TIA.   Clinical Impression  Pt admitted with above diagnosis. PTA, pt was independent with functional mobility, ADLs, and IADLs. He lives alone in a one story house with a ramped entrance. Pt currently with functional limitations due to the deficits listed below (see PT Problem List). He required minA for bed mobility and CGA for transfers using RW. Pt is currently limited by incontinence, pain, and decreased activity tolerance. Pt will benefit from acute skilled PT to increase his independence and safety with mobility to allow discharge. Recommend HHPT to increase strength, improve balance, advance endurance, decrease fall risk, and optimize safety within the home environment.      If plan is discharge home, recommend the following: A little help with walking and/or transfers;A little help with bathing/dressing/bathroom;Assistance with cooking/housework;Assist for transportation;Help with stairs or ramp for entrance   Can travel by private vehicle        Equipment Recommendations None recommended by PT  Recommendations for Other Services       Functional Status Assessment Patient has had a recent decline in their functional status and demonstrates the ability to make significant improvements in function in a reasonable and predictable amount of time.     Precautions / Restrictions Precautions Precautions: Fall Recall of Precautions/Restrictions: Intact Restrictions Weight Bearing Restrictions Per Provider Order: No       Mobility  Bed Mobility Overal bed mobility: Needs Assistance Bed Mobility: Rolling, Sidelying to Sit Rolling: Min assist, Used rails Sidelying to sit: Min assist, HOB elevated, Used rails       General bed mobility comments: Pt greeted soiled in bed. PT began performing pericare and NT entered to assist. Pt rolled to his right with cues for sequencing and assist at trunk/pelvis. Pt brought BLE off EOB and required assist to elevate trunk. Pt scooted fwd with BUE support.    Transfers Overall transfer level: Needs assistance Equipment used: 1 person hand held assist, Rolling walker (2 wheels) Transfers: Sit to/from Stand, Bed to chair/wheelchair/BSC Sit to Stand: Contact guard assist   Step pivot transfers: Contact guard assist       General transfer comment: Pt stood from lowest bed height. He pushed up with BUE support. Transferred to [facility] positioned touching bed on his left. Introduced RW. Educated pt on proper hand placement. He powered up with CGA. Pt was dependent for pericare. Transferred to recliner chair on right. Good eccentric control.    Ambulation/Gait Ambulation/Gait assistance: Contact guard assist Gait Distance (Feet): 5 Feet Assistive device: Rolling walker (2 wheels) Gait Pattern/deviations: Step-through pattern, Decreased stride length Gait velocity: decreased     General Gait Details: Pt ambulated with short slow steps. Cues for proximity to RW and sequencing. He manuevered within the room well. Distance limited d/t incontinence.  Stairs            Wheelchair Mobility     Tilt Bed    Modified Rankin (Stroke Patients Only)       Balance Overall balance assessment: Needs assistance Sitting-balance support: Bilateral upper extremity supported, Feet supported Sitting balance-Leahy Scale: Fair  Sitting balance - Comments: Pt sat EOB with CGA.   Standing balance support: Bilateral upper extremity supported, During functional activity, Reliant  on assistive device for balance Standing balance-Leahy Scale: Poor Standing balance comment: Pt dependent on RW                             Pertinent Vitals/Pain Pain Assessment Pain Assessment: Faces Faces Pain Scale: Hurts even more Pain Location: Back and L shoulder Pain Descriptors / Indicators: Discomfort, Aching, Sore Pain Intervention(s): Monitored during session, Limited activity within patient's tolerance, Repositioned    Home Living Family/patient expects to be discharged to:: Private residence Living Arrangements: Alone Available Help at Discharge: Family;Available PRN/intermittently Type of Home: House Home Access: Ramped entrance;Stairs to enter (ramp on back) Entrance Stairs-Rails: Right;Left Entrance Stairs-Number of Steps: 6 (front porch)   Home Layout: One level Home Equipment: Hand held shower head;Rolling Walker (2 wheels);Cane - single point      Prior Function Prior Level of Function : Independent/Modified Independent;Driving             Mobility Comments: Ambulates without AD. Denies fall history. ADLs Comments: Indep with ADLs/IADLs. Completes yard work lightly.     Extremity/Trunk Assessment   Upper Extremity Assessment Upper Extremity Assessment: Defer to OT evaluation    Lower Extremity Assessment Lower Extremity Assessment: Overall WFL for tasks assessed    Cervical / Trunk Assessment Cervical / Trunk Assessment: Normal  Communication   Communication Communication: Impaired Factors Affecting Communication: Hearing impaired    Cognition Arousal: Alert Behavior During Therapy: WFL for tasks assessed/performed   PT - Cognitive impairments: No apparent impairments                       PT - Cognition Comments: Pt A,Ox4 Following commands: Intact       Cueing Cueing Techniques: Verbal cues     General Comments General comments (skin integrity, edema, etc.): VSS on RA. Pt had previously coughed up blood  onto tissues that he left on tray table. Bed linen was stripped. Pericare addressed. Pt's gown changed.    Exercises     Assessment/Plan    PT Assessment Patient needs continued PT services  PT Problem List Decreased strength;Decreased activity tolerance;Decreased balance;Decreased mobility;Decreased knowledge of use of DME       PT Treatment Interventions DME instruction;Gait training;Stair training;Functional mobility training;Therapeutic activities;Therapeutic exercise;Balance training;Patient/family education    PT Goals (Current goals can be found in the Care Plan section)  Acute Rehab PT Goals Patient Stated Goal: Return Home PT Goal Formulation: With patient Time For Goal Achievement: 02/07/24 Potential to Achieve Goals: Good    Frequency Min 2X/week     Co-evaluation               AM-PAC PT 6 Clicks Mobility  Outcome Measure Help needed turning from your back to your side while in a flat bed without using bedrails?: A Little Help needed moving from lying on your back to sitting on the side of a flat bed without using bedrails?: A Little Help needed moving to and from a bed to a chair (including a wheelchair)?: A Little Help needed standing up from a chair using your arms (e.g., wheelchair or bedside chair)?: A Little Help needed to walk in hospital room?: A Little Help needed climbing 3-5 steps with a railing? : A Lot 6 Click Score: 17    End of Session Equipment  Utilized During Treatment: Gait belt Activity Tolerance: Patient tolerated treatment well;Treatment limited secondary to medical complications (Comment) (diarrhea) Patient left: in chair;with call bell/phone within reach;with chair alarm set Nurse Communication: Mobility status PT Visit Diagnosis: Difficulty in walking, not elsewhere classified (R26.2);Unsteadiness on feet (R26.81)    Time: 9251-9179 PT Time Calculation (min) (ACUTE ONLY): 32 min   Charges:   PT Evaluation $PT Eval Moderate  Complexity: 1 Mod PT Treatments $Therapeutic Activity: 8-22 mins PT General Charges $$ ACUTE PT VISIT: 1 Visit         Randall SAUNDERS, PT, DPT Acute Rehabilitation Services Office: [phone] Secure Chat Preferred  Billy Billy [name] 01/24/2024, 9:04 AM

## 2024-01-25 DIAGNOSIS — I4891 Unspecified atrial fibrillation: Secondary | ICD-10-CM

## 2024-01-25 DIAGNOSIS — K223 Perforation of esophagus: Secondary | ICD-10-CM | POA: Diagnosis not present

## 2024-01-25 DIAGNOSIS — I5032 Chronic diastolic (congestive) heart failure: Secondary | ICD-10-CM | POA: Diagnosis not present

## 2024-01-25 LAB — GLUCOSE, CAPILLARY
Glucose-Capillary: 166 mg/dL — ABNORMAL HIGH (ref 70–99)
Glucose-Capillary: 160 mg/dL — ABNORMAL HIGH (ref 70–99)
Glucose-Capillary: 171 mg/dL — ABNORMAL HIGH (ref 70–99)
Glucose-Capillary: 131 mg/dL — ABNORMAL HIGH (ref 70–99)
Glucose-Capillary: 152 mg/dL — ABNORMAL HIGH (ref 70–99)
Glucose-Capillary: 149 mg/dL — ABNORMAL HIGH (ref 70–99)

## 2024-01-25 LAB — COMPREHENSIVE METABOLIC PANEL WITH GFR
ALT: 17 U/L (ref 0–44)
AST: 18 U/L (ref 15–41)
Albumin: 2.2 g/dL — ABNORMAL LOW (ref 3.5–5.0)
Alkaline Phosphatase: 45 U/L (ref 38–126)
Anion gap: 12 (ref 5–15)
BUN: 54 mg/dL — ABNORMAL HIGH (ref 8–23)
CO2: 21 mmol/L — ABNORMAL LOW (ref 22–32)
Calcium: 8.6 mg/dL — ABNORMAL LOW (ref 8.9–10.3)
Chloride: 106 mmol/L (ref 98–111)
Creatinine, Ser: 2.1 mg/dL — ABNORMAL HIGH (ref 0.61–1.24)
GFR, Estimated: 33 mL/min — ABNORMAL LOW (ref >60)
Glucose, Bld: 167 mg/dL — ABNORMAL HIGH (ref 70–99)
Potassium: 3.3 mmol/L — ABNORMAL LOW (ref 3.5–5.1)
Sodium: 139 mmol/L (ref 135–145)
Total Bilirubin: 1.3 mg/dL — ABNORMAL HIGH (ref 0.0–1.2)
Total Protein: 5.8 g/dL — ABNORMAL LOW (ref 6.5–8.1)

## 2024-01-25 LAB — CBC
HCT: 35.8 % — ABNORMAL LOW (ref 39.0–52.0)
Hemoglobin: 11.1 g/dL — ABNORMAL LOW (ref 13.0–17.0)
MCH: 27.6 pg (ref 26.0–34.0)
MCHC: 31 g/dL (ref 30.0–36.0)
MCV: 89.1 fL (ref 80.0–100.0)
Platelets: 235 K/uL (ref 150–400)
RBC: 4.02 MIL/uL — ABNORMAL LOW (ref 4.22–5.81)
RDW: 18.5 % — ABNORMAL HIGH (ref 11.5–15.5)
WBC: 11.7 K/uL — ABNORMAL HIGH (ref 4.0–10.5)
nRBC: 0.3 % — ABNORMAL HIGH (ref 0.0–0.2)

## 2024-01-25 LAB — PHOSPHORUS: Phosphorus: 3.1 mg/dL (ref 2.5–4.6)

## 2024-01-25 LAB — POTASSIUM
Potassium: 4 mmol/L (ref 3.5–5.1)
Potassium: 3.3 mmol/L — ABNORMAL LOW (ref 3.5–5.1)

## 2024-01-25 LAB — HEPARIN LEVEL (UNFRACTIONATED): Heparin Unfractionated: >1.1 [IU]/mL — ABNORMAL HIGH (ref 0.30–0.70)

## 2024-01-25 LAB — MAGNESIUM: Magnesium: 2 mg/dL (ref 1.7–2.4)

## 2024-01-25 LAB — APTT: aPTT: 44 s — ABNORMAL HIGH (ref 24–36)

## 2024-01-25 MED ORDER — POTASSIUM CHLORIDE 10 MEQ/100ML IV SOLN
10.0000 meq | INTRAVENOUS | Status: DC
  Filled 2024-01-25 (×2): qty 100

## 2024-01-25 MED ORDER — FUROSEMIDE 10 MG/ML IJ SOLN
40.0000 mg | Freq: Once | INTRAMUSCULAR | Status: AC
  Administered 2024-01-25: 40 mg via INTRAVENOUS
  Filled 2024-01-25: qty 4

## 2024-01-25 MED ORDER — SODIUM CHLORIDE (PF) 0.9 % IJ SOLN
INTRAMUSCULAR | Status: AC
Start: 2024-01-25 — End: 2024-01-25
  Administered 2024-01-25: 10 mL
  Filled 2024-01-25: qty 10

## 2024-01-25 MED ORDER — ONDANSETRON HCL 4 MG/2ML IJ SOLN
4.0000 mg | Freq: Four times a day (QID) | INTRAMUSCULAR | Status: DC | PRN
  Administered 2024-01-25 – 2024-02-23 (×34): 4 mg via INTRAVENOUS
  Filled 2024-01-25 (×36): qty 2

## 2024-01-25 MED ORDER — SACUBITRIL-VALSARTAN 49-51 MG PO TABS
1.0000 | ORAL_TABLET | Freq: Two times a day (BID) | ORAL | Status: DC
  Administered 2024-01-25: 1 via ORAL
  Filled 2024-01-25 (×2): qty 1

## 2024-01-25 MED ORDER — LEVALBUTEROL HCL 0.63 MG/3ML IN NEBU
0.6300 mg | INHALATION_SOLUTION | Freq: Four times a day (QID) | RESPIRATORY_TRACT | Status: DC | PRN
  Administered 2024-01-25 – 2024-01-27 (×5): 0.63 mg via RESPIRATORY_TRACT
  Filled 2024-01-25 (×5): qty 3

## 2024-01-25 MED ORDER — TRAVASOL 10 % IV SOLN
INTRAVENOUS | Status: AC
  Filled 2024-01-25: qty 1100.2

## 2024-01-25 MED ORDER — AMIODARONE HCL IN DEXTROSE 360-4.14 MG/200ML-% IV SOLN
30.0000 mg/h | INTRAVENOUS | Status: DC
  Administered 2024-01-25 – 2024-01-28 (×6): 30 mg/h via INTRAVENOUS
  Filled 2024-01-25 (×6): qty 200

## 2024-01-25 MED ORDER — POTASSIUM CHLORIDE 20 MEQ PO PACK
40.0000 meq | PACK | Freq: Once | ORAL | Status: AC
Start: 2024-01-25 — End: 2024-01-25
  Administered 2024-01-25: 40 meq via ORAL
  Filled 2024-01-25: qty 2

## 2024-01-25 MED ORDER — BENZONATATE 100 MG PO CAPS
200.0000 mg | ORAL_CAPSULE | Freq: Three times a day (TID) | ORAL | Status: DC | PRN
  Administered 2024-01-25 – 2024-01-30 (×6): 200 mg via ORAL
  Filled 2024-01-25 (×7): qty 2

## 2024-01-25 MED ORDER — METHOCARBAMOL 500 MG PO TABS
500.0000 mg | ORAL_TABLET | Freq: Three times a day (TID) | ORAL | Status: DC
  Administered 2024-01-25 – 2024-01-30 (×17): 500 mg via ORAL
  Filled 2024-01-25 (×18): qty 1

## 2024-01-25 NOTE — Progress Notes (Addendum)
 Nutrition Follow-up  DOCUMENTATION CODES:   Non-severe (moderate) malnutrition in context of acute illness/injury  INTERVENTION:  Continue TPN per pharmacy MVI/Thiamine supplementation added to TPN Continue TPN until diet advanced past liquids, pt demonstrating tolerance of puree type consistency and eating adequately. Pt has poor appetite and likely will not be able to consume enough ONS to justify discontinuation of TPN as pt reports he doe snot tolerate ONS well.   Once diet advanced to FLD or above recommend: Ensure Plus High Protein po BID, each supplement provides 350 kcal and 20 grams of protein Mighty Shake TID with meals, each supplement provides 330 kcals and 9 grams of protein *Prefers Vanilla MVI and Thiamine supplement by po   NUTRITION DIAGNOSIS:   Moderate Malnutrition related to acute illness (may have chronic component due to time frame of reported wt loss) as evidenced by mild fat depletion, percent weight loss, energy intake < or equal to 50% for > or equal to 5 days. - Ongoing   GOAL:   Patient will meet greater than or equal to 90% of their needs - Meeting via TPN   MONITOR:   Diet advancement, I & O's, Labs, Weight trends (TPN)  REASON FOR ASSESSMENT:   Consult, Rounds New TPN/TNA  ASSESSMENT:  73 yo male admitted post several days of N/V followed by sudden onset of severe epigastric pain radiating to chest and back. Pt found to have small esophageal perforation above GE junction. PMH includes CAD, chronic HFpEF, HTN, HLD, DVT, CVA, PAF, CKD 3b.  10/06 - Esophagogram/barium study: distal esophageal perforation, TPN started 10/7 - S/p Esophageal stent, TPN at goal rate of 80 ml/hr 10/8 - CLD,    Pt doing well this morning, TPN at goal since 10/8. Has been tolerating CLD pt with poor intake, pt does not have an appetite, even when asked if he could have a cheeseburger pt said no. Has slight chest and abdominal pain. Some coughing when eating. Having BM.    Was able to provide more detail about his eating habits PTA. Pt was 200 lbs years ago but has been losing weight since November 2024, - 15 lbs, 8% in 11 months. Pt reports since November 2024 his appetite has really decreased. Pt hospitalized in April 2025 after cardiac arrest and intake declined even further.Pt's wight fluctuates due to fluid pt reports his UBW now is 170 lbs. Question wether recent weight is accurate as pt was admitted at 160 lbs now 174 lbs. Overall pt is losing weight.   Within the last year pt reports eating 2 meals per day with little snacking in between. Was not taking ONS at home. Pt thinks he was eating well because he was not gaining weight however, RD went over dietary recall and pt was eating very small meals like PB and J. Explained to pt he was not eating enough, pt got defensive saying he does not want to be back to 200 lb.  Explained what diet progression will look like with next goal of FLD. Discussed that pt will how to show good PO intake in order to have TPN discontinued and he will likely have to rely on supplements for this to happen. Pt acknowledged but was hesitant if he will be able to drink supplements as they give him diarrhea.   Recommend continuing TPN until diet advanced past liquids, pt demonstrating tolerance of puree type consistency and eating adequately. Pt has poor appetite and likely will not be able to consume enough ONS to  justify discontinuation of TPN as pt reports he doe snot tolerate ONS well. Continue TPN until po intake is adequate.   Per Pharmacy: Goal TPN rate @ 80 mL/hr (provides ~110 g of protein and 2101 kcals per day) TPN being concentrated to reduce fluid intake as pt with HFpEF  Admit weight: 72.8 kg Current weight: 78.9 kg  UBW: 170 lbs   Average Meal Intake: No meals recorded   Intake/Output Summary (Last 24 hours) at 01/25/2024 1216 Last data filed at 01/25/2024 0523 Gross per 24 hour  Intake 1394.74 ml  Output 650 ml   Net 744.74 ml   Drains/Lines:  PICC  Nutritionally Relevant Medications: Scheduled Meds:  insulin  aspart  10 Units Intravenous Once   And   dextrose   1 ampule Intravenous Once   insulin  aspart  0-15 Units Subcutaneous Q4H   potassium chloride   40 mEq Oral Once   Continuous Infusions:  calcium  gluconate     famotidine (PEPCID) IV 20 mg (01/25/24 1140)   fluconazole (DIFLUCAN) IV 200 mg (01/25/24 1152)   piperacillin -tazobactam (ZOSYN )  IV 3.375 g (01/25/24 0532)   TPN ADULT (ION) 80 mL/hr at 01/24/24 1724   TPN ADULT (ION)     Labs Reviewed: Potassium 3.3 BUN 54 Creatinine 2.10 GFR 33 CBG ranges from 134-182 mg/dL over the last 24 hours HgbA1c 6  Diet Order:   Diet Order             Diet clear liquid Room service appropriate? Yes; Fluid consistency: Thin  Diet effective now                   EDUCATION NEEDS:   Education needs have been addressed  Skin:  Skin Assessment: Reviewed RN Assessment  Last BM:  10/9, type 7  Height:   Ht Readings from Last 1 Encounters:  01/22/24 6' 1 (1.854 m)    Weight:   Wt Readings from Last 1 Encounters:  01/25/24 78.9 kg    Ideal Body Weight:  83.6 kg  BMI:  Body mass index is 22.95 kg/m.  Estimated Nutritional Needs:   Kcal:  2100-2300 kcals  Protein:  110-130 g  Fluid:  2L   Olivia Kenning, RD Registered Dietitian  See Amion for more information

## 2024-01-25 NOTE — Plan of Care (Signed)

## 2024-01-25 NOTE — Progress Notes (Signed)
 Physical Therapy Treatment Patient Details Name: Billy [name] MRN: [id number] DOB: June 21, 1950 Today's Date: 01/25/2024   History of Present Illness Billy [name] is a 73 y.o. male who presented to [facility] ED 01/21/24 with several days of vomiting followed by an episode of severe tearing epigastric pain. Work-up revealed esophageal tear. Pt s/p esophageal stent with EGD 10/7. PMHx: HFrEF, AFib, CAD, PE complicated by cardiogenic shock, CKD 4, MI, peripheral neuropathy, and TIA.    PT Comments  Pt greeted supine in bed, pleasant and agreeable to PT session. He increased gait distance, ambulating ~127ft using RW twice with CGA. He required a prolonged seated rest break between bouts. Pt continues to be limited by pain and decreased activity tolerance. He required increased time to complete all functional mobility. Pt is making steady progress towards his acute PT goals. Will continue to follow acutely and advance appropriately.      If plan is discharge home, recommend the following: A little help with walking and/or transfers;A little help with bathing/dressing/bathroom;Assistance with cooking/housework;Assist for transportation;Help with stairs or ramp for entrance   Can travel by private vehicle        Equipment Recommendations  None recommended by PT    Recommendations for Other Services       Precautions / Restrictions Precautions Precautions: Fall Recall of Precautions/Restrictions: Intact Restrictions Weight Bearing Restrictions Per Provider Order: No     Mobility  Bed Mobility Overal bed mobility: Needs Assistance Bed Mobility: Rolling, Sidelying to Sit Rolling: Supervision Sidelying to sit: Supervision, HOB elevated       General bed mobility comments: Pt sat up on R side of bed with increased time. He utilized log roll technique. Pt brought BLE off EOB and elevated trunk without assist. Increased time and HOB elevate to 16deg.    Transfers Overall transfer level: Needs  assistance Equipment used: None Transfers: Sit to/from Stand, Bed to chair/wheelchair/BSC Sit to Stand: Supervision   Step pivot transfers: Supervision       General transfer comment: Pt stood from lowest bed height without AD. He transferred to [facility] on left touching bed. Supervision for safety and to manage lines. Good eccentric control.    Ambulation/Gait Ambulation/Gait assistance: Contact guard assist Gait Distance (Feet): 150 Feet (x2, prolonged seated rest break between bouts) Assistive device: Rolling walker (2 wheels) Gait Pattern/deviations: Step-through pattern, Decreased stride length Gait velocity: reduced Gait velocity interpretation: 1.31 - 2.62 ft/sec, indicative of limited community ambulator   General Gait Details: Pt ambulated with a reciprocal gait pattern, even weight shift, and good foot clearence. Cues for proximity to RW and maintain body inside AD at all times. He navigated hallway well. No LOB.   Stairs             Wheelchair Mobility     Tilt Bed    Modified Rankin (Stroke Patients Only)       Balance Overall balance assessment: Needs assistance Sitting-balance support: No upper extremity supported, Feet supported Sitting balance-Leahy Scale: Good     Standing balance support: Bilateral upper extremity supported, During functional activity, Reliant on assistive device for balance, No upper extremity supported Standing balance-Leahy Scale: Fair Standing balance comment: Pt able to trasnfer without AD. Benefits from RW for improved stability/support.                            Communication Communication Communication: No apparent difficulties  Cognition Arousal: Alert Behavior During Therapy: [facility] for tasks assessed/performed  PT - Cognitive impairments: No apparent impairments                         Following commands: Intact      Cueing Cueing Techniques: Verbal cues  Exercises      General Comments         Pertinent Vitals/Pain Pain Assessment Pain Assessment: Faces Faces Pain Scale: Hurts even more Pain Location: Abdomen, L shoulder/chest Pain Descriptors / Indicators: Discomfort, Aching, Sore, Pressure Pain Intervention(s): Monitored during session, Limited activity within patient's tolerance, Repositioned, Patient requesting pain meds-RN notified    Home Living                          Prior Function            PT Goals (current goals can now be found in the care plan section) Acute Rehab PT Goals Patient Stated Goal: Return Home PT Goal Formulation: With patient Time For Goal Achievement: 02/07/24 Potential to Achieve Goals: Good Progress towards PT goals: Progressing toward goals    Frequency    Min 2X/week      PT Plan      Co-evaluation              AM-PAC PT 6 Clicks Mobility   Outcome Measure  Help needed turning from your back to your side while in a flat bed without using bedrails?: A Little Help needed moving from lying on your back to sitting on the side of a flat bed without using bedrails?: A Little Help needed moving to and from a bed to a chair (including a wheelchair)?: A Little Help needed standing up from a chair using your arms (e.g., wheelchair or bedside chair)?: A Little Help needed to walk in hospital room?: A Little Help needed climbing 3-5 steps with a railing? : A Lot 6 Click Score: 17    End of Session Equipment Utilized During Treatment: Gait belt Activity Tolerance: Patient tolerated treatment well Patient left: Other (comment);with call bell/phone within reach (on [facility] with NT aware and pt verbalizing understanding to call for assist when done) Nurse Communication: Mobility status;Other (comment) (pt positioning at end of session and NT aware to listen for call out) PT Visit Diagnosis: Difficulty in walking, not elsewhere classified (R26.2);Unsteadiness on feet (R26.81)     Time: 9047-8979 PT Time  Calculation (min) (ACUTE ONLY): 28 min  Charges:    $Gait Training: 23-37 mins PT General Charges $$ ACUTE PT VISIT: 1 Visit                     Randall SAUNDERS, PT, DPT Acute Rehabilitation Services Office: [phone] Secure Chat Preferred  Delon CHRISTELLA Callander 01/25/2024, 11:02 AM

## 2024-01-25 NOTE — Plan of Care (Signed)
  Problem: Education: Goal: Knowledge of General Education information will improve Description: Including pain rating scale, medication(s)/side effects and non-pharmacologic comfort measures Outcome: Progressing   Problem: Health Behavior/Discharge Planning: Goal: Ability to manage health-related needs will improve Outcome: Progressing   Problem: Clinical Measurements: Goal: Ability to maintain clinical measurements within normal limits will improve Outcome: Progressing Goal: Will remain free from infection Outcome: Progressing Goal: Diagnostic test results will improve Outcome: Progressing Goal: Respiratory complications will improve Outcome: Progressing Goal: Cardiovascular complication will be avoided Outcome: Progressing   Problem: Activity: Goal: Risk for activity intolerance will decrease Outcome: Progressing   Problem: Nutrition: Goal: Adequate nutrition will be maintained Outcome: Progressing   Problem: Coping: Goal: Level of anxiety will decrease Outcome: Progressing   Problem: Elimination: Goal: Will not experience complications related to bowel motility Outcome: Progressing Goal: Will not experience complications related to urinary retention Outcome: Progressing   Problem: Pain Managment: Goal: General experience of comfort will improve and/or be controlled Outcome: Progressing   Problem: Safety: Goal: Ability to remain free from injury will improve Outcome: Progressing   Problem: Skin Integrity: Goal: Risk for impaired skin integrity will decrease Outcome: Progressing   Problem: Coping: Goal: Ability to adjust to condition or change in health will improve Outcome: Progressing   Problem: Fluid Volume: Goal: Ability to maintain a balanced intake and output will improve Outcome: Progressing   Problem: Health Behavior/Discharge Planning: Goal: Ability to identify and utilize available resources and services will improve Outcome: Progressing Goal:  Ability to manage health-related needs will improve Outcome: Progressing   Problem: Metabolic: Goal: Ability to maintain appropriate glucose levels will improve Outcome: Progressing   Problem: Nutritional: Goal: Maintenance of adequate nutrition will improve Outcome: Progressing Goal: Progress toward achieving an optimal weight will improve Outcome: Progressing   Problem: Skin Integrity: Goal: Risk for impaired skin integrity will decrease Outcome: Progressing

## 2024-01-25 NOTE — Progress Notes (Signed)
 Removed left wrist PIV d/t infiltration while attempting to flush with normal saline and patient complained of discomfort. No fluids infusing prior to flushing PIV. Notified Powell Blush, [facility] no further interventions needed per [facility].

## 2024-01-25 NOTE — Progress Notes (Signed)
 CCMD called stating that patient does not have active orders for cardiac monitoring and asked if patient should be taken off tele. Informed CCMD confirmation with MD before removing tele monitor. Notified Dr. Nilda Fendt and per MD d'c tele. Notified CCMD to d'c tele per MD.

## 2024-01-25 NOTE — Progress Notes (Signed)
 Called patient's sister, Diane Isley, to notify of patient's transfer to 2C13 per patient's request. No answer, left voicemail to call back before 7p for details.

## 2024-01-25 NOTE — Progress Notes (Signed)
 Report given to Almarie, RN patient going to [facility].

## 2024-01-25 NOTE — Progress Notes (Addendum)
 PHARMACY - TOTAL PARENTERAL NUTRITION CONSULT NOTE   Indication: intolerance to enteral feedings, esophageal performation  Patient Measurements: Height: 6' 1 (185.4 cm) Weight: 78.9 kg (173 lb 15.1 oz) IBW/kg (Calculated) : 79.9 TPN AdjBW (KG): 72.8 Body mass index is 22.95 kg/m.  Assessment:  73 year old man with medical history significant for cardiac arrest earlier this year who was admitted after several days of nausea and vomiting from a stomach bug followed by sudden onset severe tearing epigastric pain radiating to back and chest. 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 Pharmacy consulted to initiate TPN due to inability to tolerate enteral nutrition at this time.  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 PTA takes torsemide  20 mg PO daily, now held. Aggressive fluid resuscitation recommended per GI. Per HF, patient currently euvolemic but will hold diuretic for now. Due to CHF will target lower end of fluid range for TPN and supplement additional fluids per MD outside of TPN to prevent fluid overload.   Stent placed on 10/6, esophogram not showing any leak. Diet advanced to CLD.   Glucose / Insulin : A1C (02/2023) 6.0; CBGs 134-166, mSSI (15u/24h)  Electrolytes: K 3.3, Na 139, Mg 2.0, Phos 3.1 (s/p 30 mmol NaPhos) CoCa 10.0, others wnl, HCO3: 21, Cl: 106  Renal: Scr downtrending 2.10 (bsl ~2), BUN 54 Hepatic: AST/ALT wnl, tbili 0.5, alb 2.2 Intake / Output; MIVF: UOP charted - , LBM 10/8, bisacodyl , net + 3.4L   GI Imaging: 10/06 Esophagogram/barium study: distal esophageal perforation 10/05 CT: concerning for esophageal perforation, masslike area of soft tissue thickening adjacent to and surrounding the esophagus - differential considerations remain esophageal mass, hiatal hernia, or infection GI Surgeries / Procedures:  10/6: esophageal stent placement   Central access: 01/22/24 TPN start date: 01/22/24  Nutritional  Goals: Goal TPN rate is 80 mL/hr (provides ~110 g of protein and 2101 kcals per day)  RD Assessment: pending Estimated Needs Total Energy Estimated Needs: 2100-2300 kcals Total Protein Estimated Needs: 110-130 g Total Fluid Estimated Needs: 2L  Current Nutrition:  NPO 10/05 >>10/8 CLD 10/8 >>  TPN (10/06) >>  Plan:  Continue TPN to goal 80 mL/hr at 1800 to meet 100% total nutritional needs. Monitor volume status, low threshold to concentrate TPN given history of HF. Currently minimal free water  in TPN.  Electrolytes in TPN: Na 25 mEq/L, add K 10 mEq/L ( total), add Ca 3 mEq/L, add Mg 3 mEq/L, and Phos 10 mmol/L. Cl:Ac 1:2 to max acetate.  30mEq IV KCL this AM.  Add standard MVI and trace elements to TPN Continue  Moderate q4h SSI and adjust as needed.  Add thiamine 100 mg IV x5 days per RD recommendation, EOT of 10/11.  Monitor TPN labs on Mon/Thurs, daily while initiating TPN  Thank you for allowing pharmacy to be a part of this patient's care.  Powell Blush, PharmD, BCCCP  Clinical Pharmacist  ADDENDUM:   K rechecked (unclear why) and resulted at 4.0. Drawn without any potassium runs, will d/c IV Kcl and continue with addition of low KCL via TPN given improvement in renal fxn.   Powell Blush, PharmD, BCCCP   ADDENDUM:   K repeated again and resulted at 3.3, KCL ordered per HF.   Powell Blush, PharmD, BCCCP

## 2024-01-25 NOTE — Progress Notes (Addendum)
 Advanced Heart Failure Rounding Note  Cardiologist: Dorn Lesches, MD  Chief Complaint: S/P esophageal stent Subjective:    SBP in 150s. Weight up 13 lbs since admission. Afebrile. WBC 8.7>11.7 In AF RVR in 110s, irregular rhythm on exam (unable to pull up tele, on remote tele)  sCr 2.71 > 2.68 > 2.10  Lying in bed. Frustrated by pain control and by feeling like he doesn't know what the plan is. Occasional tearful.  Objective:    Weight Range: 78.9 kg Body mass index is 22.95 kg/m.   Vital Signs:   Temp:  [97.6 F (36.4 C)-98.9 F (37.2 C)] 98.9 F (37.2 C) (10/09 0931) Pulse Rate:  [55-100] 89 (10/09 0931) Resp:  [17-18] 18 (10/09 0931) BP: (123-178)/(70-86) 123/75 (10/09 0931) SpO2:  [90 %-96 %] 96 % (10/09 0931) Weight:  [78.9 kg] 78.9 kg (10/09 0500) Last BM Date : 01/24/24  Weight change: Filed Weights   01/23/24 0500 01/24/24 0500 01/25/24 0500  Weight: 75.5 kg 78.5 kg 78.9 kg   Intake/Output:  Intake/Output Summary (Last 24 hours) at 01/25/2024 1159 Last data filed at 01/25/2024 0523 Gross per 24 hour  Intake 1394.74 ml  Output 650 ml  Net 744.74 ml    Physical Exam   General: Elderly appearing.  Cardiac: JVP ~10cm. S1 and S2 present. Rate irregular Resp: Lung sounds clear and equal B/L Extremities: Warm and dry.  Trace BLE edema.  Neuro: Alert and oriented x3. Affect pleasant.   Telemetry   On remote tele, unable to review  Labs    CBC Recent Labs    01/24/24 0208 01/25/24 0307  WBC 8.7 11.7*  HGB 10.8* 11.1*  HCT 34.4* 35.8*  MCV 88.2 89.1  PLT 186 235   Basic Metabolic Panel Recent Labs    89/91/74 0208 01/25/24 0307 01/25/24 0855 01/25/24 1053  NA 140 139  --   --   K 3.9 3.3* 4.0 3.3*  CL 106 106  --   --   CO2 26 21*  --   --   GLUCOSE 171* 167*  --   --   BUN 57* 54*  --   --   CREATININE 2.42* 2.10*  --   --   CALCIUM  8.9 8.6*  --   --   MG 2.3 2.0  --   --   PHOS 1.7* 3.1  --   --    Liver Function  Tests Recent Labs    01/25/24 0307  AST 18  ALT 17  ALKPHOS 45  BILITOT 1.3*  PROT 5.8*  ALBUMIN  2.2*   BNP (last 3 results) Recent Labs    03/09/23 1544 03/11/23 0218 06/27/23 1116  BNP 660.9* 491.3* 646.4*   Hemoglobin A1C Recent Labs    01/23/24 1202  HGBA1C 6.0*   Fasting Lipid Panel Recent Labs    01/22/24 1611  TRIG 38   Medications:    Scheduled Medications:  bisacodyl   10 mg Rectal Q0600   Chlorhexidine  Gluconate Cloth  6 each Topical Daily   insulin  aspart  10 Units Intravenous Once   And   dextrose   1 ampule Intravenous Once   insulin  aspart  0-15 Units Subcutaneous Q4H   mupirocin ointment  1 Application Nasal BID   pantoprazole  (PROTONIX ) IV  40 mg Intravenous QHS   sodium bicarbonate   50 mEq Intravenous Once   sodium chloride  flush  10-40 mL Intracatheter Q12H    Infusions:  calcium  gluconate     famotidine (PEPCID)  IV 20 mg (01/25/24 1140)   fluconazole (DIFLUCAN) IV 200 mg (01/25/24 1152)   piperacillin -tazobactam (ZOSYN )  IV 3.375 g (01/25/24 0532)   TPN ADULT (ION) 80 mL/hr at 01/24/24 1724   TPN ADULT (ION)      PRN Medications: HYDROmorphone  (DILAUDID ) injection, methocarbamol  (ROBAXIN ) injection, mouth rinse, oxyCODONE , sodium chloride  flush  Patient Profile    Patient with longstanding history of chronic heart failure with preserved ejection fraction, RV failure with history of pulmonary embolism presents with esophageal perforation.  S/P Stent Esophageal Stent   Assessment/Plan   1.  Esophageal Perforation - CT C/A/P: with pneumomediastinum and large mass-like density in lower esophagus - Prior EGD 3/25: erosive gastritis, duodenal deformity, and bleeding friable duodenal mucosa s/p hemostatic 01/23/24 S/P EGD with esophageal stent placed  - On TPN; clear liquid diet - PO meds ok per Dr. Shyrl - pain poorly controlled, will have RN give PRN pain medication - schedule robaxin  q8h  2. Chronic HFpEF: h/o cardiogenic  shock with RV failure in the setting of cardiac arrest thought to be caused by acute PE vs ACS in 3/25. Echo at the time showed EF 55%, severe, LVH, G2DD, and severely reduced RV function with strain. There was concern for cardiac sarcoid amyloid, however CMR LGE consistent with ischemic disease. RV on echo 9/25 with complete recovery, EF 60-65%, nl RV function.    - RHC 3/25: Mild PAH with severe RV failure though CO is preserved.   - Hypervolemic on exam. Given IV Lasix  40 mg today - start CVP monitoring - GDMT limited by AKI - start entresto  tomorrow if renal function stable   3. mvCAD:  - LHC 3/25: 3v CAD with CTO RCA with L->R collaterals and moderate non-obstructive CAD in L system   4.  AKI on CKD Stage 3b: Baseline sCr ~ 2.1. - 2.3 on admit, peaked at 2.7 - Cr 2.1 today, at baseline    5. PAF:   - s/p TEE/DCCV 3/25 - developed AF with RVR, unsure when as tele is not available  - continuous unit telemetry monitoring - start IV amio 30/hr for rate control - off heparin  with hemoptysis, d/w TCTS will defer restarting at this time   6.  H/o DVT/PE:  Bilateral upper and lower DVTs, mixed acute and chronic.  - s/p infrarenal IVC placement 3/25 - V/Q 3/25 with multiple perfusion defects - will need lifelong AC, timing per CTS   7. Carotid stenosis: h/o CVA. TCAR in 6/24. Okay to remain off plavix  at this point. - carotid US  9/25: widely patent carotid stent on R and no stenosis on L   Will transfer to cardiac unit given Afib RVR and hypervolemia.   Length of Stay: 4  [place] Lee, NP  01/25/2024, 11:59 AM  Advanced Heart Failure Team Pager [phone] (M-F; 7a - 5p)  Please contact [facility] for night-coverage after hours (5p -7a ) and weekends on [email or web address]  Patient seen and examined with the above-signed Advanced Practice Provider and/or Housestaff. I personally reviewed laboratory data, imaging studies and relevant notes. I independently examined the patient and formulated  the important aspects of the plan. I have edited the note to reflect any of my changes or salient points. I have personally discussed the plan with the patient and/or family.  Has passed swallow study. C/o of some pain.   Frustrated due to perceived lack of care  Not on tele but seems to be back in AF with RVR. Mild fluid overload on  exam   General:  Lying in bed No resp difficulty HEENT: normal Neck: supple. JVP to jaw  Carotids 2+ bilat; no bruits. No lymphadenopathy or thryomegaly appreciated. Cor Irreg tachy  Lungs: clear Abdomen: soft, nontender, nondistended. No hepatosplenomegaly. No bruits or masses. Good bowel sounds. Extremities: no cyanosis, clubbing, rash, 1+ edema Neuro: alert & orientedx3, cranial nerves grossly intact. moves all 4 extremities w/o difficulty. Affect pleasant  He appears to be back in AF with mild HF.   I d/w 6N nursing team and will transfer to 2c to get further cardiaic care.   Start IV amio and IV lasix . Will need resumption of GDMT as able  Resume AC when Ok with TCTS  Toribio Fuel, MD  6:11 PM

## 2024-01-25 NOTE — TOC Progression Note (Addendum)
 Transition of Care [facility]) - Progression Note    Patient Details  Name: Billy [name] MRN: [id number] Date of Birth: 13-Sep-1950  [facility]) CM/SW Contact  Justina Delcia Czar, RN Phone Number: [phone] 01/25/2024, 4:28 PM  Clinical Narrative:      Chart reviewed for discharge readiness, patient not medically stable for d/c continues on amio gtt, IV abx and TPN. [employer] rep, Pam RN is follow for possible Home IV.   Inpatient CM/CSW will continue to monitor pt's advancement through interdisciplinary progression rounds.   If new pt transition needs arise, MD please place a TOC consult.     Expected Discharge Plan: Home/Self Care Barriers to Discharge: Continued Medical Work up    Expected Discharge Plan and Services   Discharge Planning Services: CM Consult   Living arrangements for the past 2 months: Single Family Home                                       Social Drivers of Health (SDOH) Interventions SDOH Screenings   Food Insecurity: No Food Insecurity (01/22/2024)  Housing: High Risk (01/22/2024)  Transportation Needs: No Transportation Needs (01/22/2024)  Utilities: Not At Risk (01/22/2024)  Depression (PHQ2-9): Low Risk  (12/01/2020)  Social Connections: Socially Isolated (01/22/2024)  Tobacco Use: Low Risk  (01/22/2024)    Readmission Risk Interventions    06/27/2023    1:52 PM 10/14/2022   11:29 AM  Readmission Risk Prevention Plan  Post Dischage Appt  Complete  Medication Screening  Complete  Transportation Screening Complete Complete  HRI or Home Care Consult Complete   Social Work Consult for Recovery Care Planning/Counseling Complete   Palliative Care Screening Not Applicable   Medication Review Oceanographer) Referral to Pharmacy

## 2024-01-25 NOTE — Progress Notes (Signed)
 Patient transferred to 2C13 with two patient belonging bags including cell phone and charger.

## 2024-01-25 NOTE — Progress Notes (Signed)
 Notified patient is transferring to 2C05 called the unit to give report to receiving nurse. Receiving nurse stated she has to look at the patient and will call back to get report, gave nurse my direct number to call back for report.

## 2024-01-25 NOTE — Progress Notes (Signed)
 Called phlebotomy at [phone] to follow up on lab draws. Phlebotomy tech on the way to draw labs.

## 2024-01-25 NOTE — Plan of Care (Signed)

## 2024-01-25 NOTE — Progress Notes (Signed)
 Patient ask for pain medication oxy 10 mg given at 0906. Patient is sleeping. Patient states pain is 9 on 0-10 pain scale. Patient specifically request dilaudid  IV then falls back to sleep. Patient is resting comfortably, holding pain medication for now.

## 2024-01-25 NOTE — Progress Notes (Signed)
 PROGRESS NOTE  Billy [name] FMW:[id number] DOB: Jul 28, 1950 DOA: 01/21/2024 PCP: Seabron Lenis, MD   LOS: 4 days   Brief Narrative / Interim history: Billy [name] is a 73 y.o. male with a history of HFrEF, AFib, CAD, PE complicated by cardiogenic shock who presented to the ED on 01/21/2024 with several days of vomiting followed by an episode of severe tearing epigastric pain. He was found to have an esophageal tear. GI and cardiothoracic surgery were consulted, took for EGD with stent placement 10/7.   Subjective / 24h Interval events: Still with some chest discomfort.  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 Shyrl 10/7.  Evaluated by cardiothoracic surgery this morning, recent swallow study did not show any leak, and okay to start clear liquids.  Closely monitor, continue TPN as well - He is on IV antibiotics, Zosyn , fluconazole, empirically for 6 weeks per cardiothoracic surgery.  If he has consistent p.o. intake today, will convert antibiotics to liquid formulation   Active problems AKI on CKD3b -baseline creatinine in the low 2s, currently at baseline   PAF, history of DVT/PE - Restart anticoagulation with IV heparin  once cleared by CT surgery, however on hold this morning due to coughing up blood.     Chronic HFrEF -heart failure team evaluated patient, watching from afar, will resume Entresto  once p.o. intake is more consistent   Hyperkalemia - Hold entresto .  Received Lasix , bicarb and calcium .  Potassium stable today  Scheduled Meds:  bisacodyl   10 mg Rectal Q0600   Chlorhexidine  Gluconate Cloth  6 each Topical Daily   insulin  aspart  10 Units Intravenous Once   And   dextrose   1 ampule Intravenous Once   insulin  aspart  0-15 Units Subcutaneous Q4H   mupirocin ointment  1 Application Nasal BID   pantoprazole  (PROTONIX ) IV  40  mg Intravenous QHS   sodium bicarbonate   50 mEq Intravenous Once   sodium chloride  flush  10-40 mL Intracatheter Q12H   Continuous Infusions:  calcium  gluconate     famotidine (PEPCID) IV 20 mg (01/24/24 2150)   fluconazole (DIFLUCAN) IV 200 mg (01/24/24 1015)   piperacillin -tazobactam (ZOSYN )  IV 3.375 g (01/25/24 0532)   TPN ADULT (ION) 80 mL/hr at 01/24/24 1724   TPN ADULT (ION)     PRN Meds:.HYDROmorphone  (DILAUDID ) injection, methocarbamol  (ROBAXIN ) injection, mouth rinse, oxyCODONE , sodium chloride  flush  Current Outpatient Medications  Medication Instructions   apixaban  (ELIQUIS ) 5 mg, Oral, 2 times daily   atorvastatin  (LIPITOR ) 80 mg, Oral, Daily   ezetimibe  (ZETIA ) 10 mg, Oral, Daily   ibuprofen (ADVIL) 400-600 mg, Oral, 2 times daily PRN   Multiple Vitamins-Minerals (MENS 50+ MULTIVITAMIN) TABS 1 tablet, Oral, Daily   sacubitril -valsartan  (ENTRESTO ) 97-103 MG 1 tablet, Oral, 2 times daily   torsemide  (DEMADEX ) 20 mg, Oral, Daily    Diet Orders (From admission, onward)     Start     Ordered   01/24/24 0852  Diet clear liquid Room service appropriate? Yes; Fluid consistency: Thin  Diet effective now       Question Answer Comment  Room service appropriate? Yes   Fluid consistency: Thin      01/24/24 0851            DVT prophylaxis: SCDs Start: 01/21/24 2023   Lab Results  Component Value Date   PLT 235 01/25/2024  Code Status: Full Code  Family Communication: No family at bedside  Status is: Inpatient Remains inpatient appropriate because: Severity of illness    Level of care: Telemetry Surgical  Consultants:  Cardiothoracic surgery Cardiology  Objective: Vitals:   01/25/24 0206 01/25/24 0500 01/25/24 0523 01/25/24 0931  BP: (!) 150/75  (!) 157/77 123/75  Pulse: (!) 55  72 89  Resp: 17  17 18   Temp: 98.8 F (37.1 C)  97.6 F (36.4 C) 98.9 F (37.2 C)  TempSrc: Oral  Oral   SpO2: 90%   96%  Weight:  78.9 kg    Height:         Intake/Output Summary (Last 24 hours) at 01/25/2024 1100 Last data filed at 01/25/2024 0523 Gross per 24 hour  Intake 1394.74 ml  Output 650 ml  Net 744.74 ml   Wt Readings from Last 3 Encounters:  01/25/24 78.9 kg  12/19/23 75.7 kg  09/27/23 74.8 kg    Examination:  Constitutional: NAD Eyes: lids and conjunctivae normal, no scleral icterus ENMT: mmm Neck: normal, supple Respiratory: clear to auscultation bilaterally, no wheezing, no crackles.  Cardiovascular: Regular rate and rhythm, no murmurs / rubs / gallops. No LE edema. Abdomen: soft, no distention, no tenderness. Bowel sounds positive.    Data Reviewed: I have independently reviewed following labs and imaging studies   CBC Recent Labs  Lab 01/21/24 1439 01/21/24 1500 01/22/24 0215 01/24/24 0208 01/25/24 0307  WBC 9.5  --  6.2 8.7 11.7*  HGB 14.8 15.3 13.5 10.8* 11.1*  HCT 47.9 45.0 44.5 34.4* 35.8*  PLT 208  --  151 186 235  MCV 89.9  --  90.6 88.2 89.1  MCH 27.8  --  27.5 27.7 27.6  MCHC 30.9  --  30.3 31.4 31.0  RDW 18.0*  --  18.0* 18.6* 18.5*  LYMPHSABS 0.7  --   --   --   --   MONOABS 0.6  --   --   --   --   EOSABS 0.1  --   --   --   --   BASOSABS 0.0  --   --   --   --     Recent Labs  Lab 01/21/24 1551 01/21/24 1728 01/22/24 0816 01/22/24 1611 01/22/24 2035 01/23/24 0019 01/23/24 0430 01/23/24 1041 01/23/24 1202 01/23/24 1931 01/24/24 0208 01/25/24 0307 01/25/24 0855  NA 143 139 140  --  142  --  142  --   --   --  140 139  --   K 2.8* 3.8 5.8*   < > 5.6*   < > 5.5* 4.8  --  4.1 3.9 3.3* 4.0  CL 118* 104 107  --  108  --  106  --   --   --  106 106  --   CO2 17* 23 26  --  23  --  25  --   --   --  26 21*  --   GLUCOSE 165* 210* 105*  --  140*  --  175*  --   --   --  171* 167*  --   BUN 37* 49* 54*  --  55*  --  56*  --   --   --  57* 54*  --   CREATININE 1.43* 2.15* 2.69*  --  2.71*  --  2.68*  --   --   --  2.42* 2.10*  --   CALCIUM  5.5* 8.2* 8.5*  --  9.8  --  9.2  --   --    --  8.9 8.6*  --   AST 21  --   --   --   --   --   --   --   --   --   --  18  --   ALT 21  --   --   --   --   --   --   --   --   --   --  17  --   ALKPHOS 33*  --   --   --   --   --   --   --   --   --   --  45  --   BILITOT 0.5  --   --   --   --   --   --   --   --   --   --  1.3*  --   ALBUMIN  2.0*  --   --   --   --   --   --   --   --   --   --  2.2*  --   MG  --   --  2.5*  --   --   --  2.4  --   --   --  2.3 2.0  --   INR  --  1.3*  --   --   --   --   --   --   --   --   --   --   --   HGBA1C  --   --   --   --   --   --   --   --  6.0*  --   --   --   --    < > = values in this interval not displayed.    ------------------------------------------------------------------------------------------------------------------ Recent Labs    01/22/24 1611  TRIG 38    Lab Results  Component Value Date   HGBA1C 6.0 (H) 01/23/2024   ------------------------------------------------------------------------------------------------------------------ No results for input(s): TSH, T4TOTAL, T3FREE, THYROIDAB in the last 72 hours.  Invalid input(s): FREET3  Cardiac Enzymes No results for input(s): CKMB, TROPONINI, MYOGLOBIN in the last 168 hours.  Invalid input(s): CK ------------------------------------------------------------------------------------------------------------------    Component Value Date/Time   BNP 646.4 (H) 06/27/2023 1116    CBG: Recent Labs  Lab 01/24/24 1610 01/24/24 1950 01/24/24 2342 01/25/24 0417 01/25/24 0758  GLUCAP 144* 144* 134* 166* 160*    Recent Results (from the past 240 hours)  MRSA Next Gen by PCR, Nasal     Status: Abnormal   Collection Time: 01/21/24  9:27 PM   Specimen: Nasal Mucosa; Nasal Swab  Result Value Ref Range Status   MRSA by PCR Next Gen DETECTED (A) NOT DETECTED Final    Comment: RESULT CALLED TO, READ BACK BY AND VERIFIED WITH: J MCBRIDE RN 01/21/2024 @ 2334  (NOTE) The GeneXpert MRSA Assay (FDA  approved for NASAL specimens only), is one component of a comprehensive MRSA colonization surveillance program. It is not intended to diagnose MRSA infection nor to guide or monitor treatment for MRSA infections. Test performance is not FDA approved in patients less than 46 years old. Performed at [facility], [address] [place], [place] [address]      Radiology Studies: No results found.   Nilda Fendt, MD, PhD [facility]  Between 7 am - 7 pm  I am available, please contact me via Amion (for emergencies) or Securechat (non urgent messages)  Between 7 pm - 7 am I am not available, please contact night coverage MD/APP via Amion

## 2024-01-26 DIAGNOSIS — R042 Hemoptysis: Secondary | ICD-10-CM | POA: Diagnosis not present

## 2024-01-26 DIAGNOSIS — K223 Perforation of esophagus: Secondary | ICD-10-CM | POA: Diagnosis not present

## 2024-01-26 DIAGNOSIS — I4891 Unspecified atrial fibrillation: Secondary | ICD-10-CM | POA: Diagnosis not present

## 2024-01-26 DIAGNOSIS — I5032 Chronic diastolic (congestive) heart failure: Secondary | ICD-10-CM | POA: Diagnosis not present

## 2024-01-26 DIAGNOSIS — Z9889 Other specified postprocedural states: Secondary | ICD-10-CM | POA: Diagnosis not present

## 2024-01-26 LAB — CBC
HCT: 32.4 % — ABNORMAL LOW (ref 39.0–52.0)
Hemoglobin: 10.1 g/dL — ABNORMAL LOW (ref 13.0–17.0)
MCH: 27.5 pg (ref 26.0–34.0)
MCHC: 31.2 g/dL (ref 30.0–36.0)
MCV: 88.3 fL (ref 80.0–100.0)
Platelets: 209 K/uL (ref 150–400)
RBC: 3.67 MIL/uL — ABNORMAL LOW (ref 4.22–5.81)
RDW: 18.5 % — ABNORMAL HIGH (ref 11.5–15.5)
WBC: 11.9 K/uL — ABNORMAL HIGH (ref 4.0–10.5)
nRBC: 0.3 % — ABNORMAL HIGH (ref 0.0–0.2)

## 2024-01-26 LAB — GLUCOSE, CAPILLARY
Glucose-Capillary: 146 mg/dL — ABNORMAL HIGH (ref 70–99)
Glucose-Capillary: 169 mg/dL — ABNORMAL HIGH (ref 70–99)
Glucose-Capillary: 143 mg/dL — ABNORMAL HIGH (ref 70–99)
Glucose-Capillary: 159 mg/dL — ABNORMAL HIGH (ref 70–99)
Glucose-Capillary: 141 mg/dL — ABNORMAL HIGH (ref 70–99)
Glucose-Capillary: 161 mg/dL — ABNORMAL HIGH (ref 70–99)

## 2024-01-26 LAB — COOXEMETRY PANEL
Carboxyhemoglobin: 1.5 % (ref 0.5–1.5)
Methemoglobin: 0.7 % (ref 0.0–1.5)
O2 Saturation: 61.7 %
Total hemoglobin: 10.8 g/dL — ABNORMAL LOW (ref 12.0–16.0)

## 2024-01-26 LAB — BASIC METABOLIC PANEL WITH GFR
Anion gap: 13 (ref 5–15)
BUN: 56 mg/dL — ABNORMAL HIGH (ref 8–23)
CO2: 23 mmol/L (ref 22–32)
Calcium: 8.5 mg/dL — ABNORMAL LOW (ref 8.9–10.3)
Chloride: 105 mmol/L (ref 98–111)
Creatinine, Ser: 2.07 mg/dL — ABNORMAL HIGH (ref 0.61–1.24)
GFR, Estimated: 34 mL/min — ABNORMAL LOW (ref >60)
Glucose, Bld: 137 mg/dL — ABNORMAL HIGH (ref 70–99)
Potassium: 3.8 mmol/L (ref 3.5–5.1)
Sodium: 141 mmol/L (ref 135–145)

## 2024-01-26 LAB — POTASSIUM: Potassium: 3.3 mmol/L — ABNORMAL LOW (ref 3.5–5.1)

## 2024-01-26 LAB — HEPARIN LEVEL (UNFRACTIONATED): Heparin Unfractionated: >1.1 [IU]/mL — ABNORMAL HIGH (ref 0.30–0.70)

## 2024-01-26 LAB — MAGNESIUM: Magnesium: 2 mg/dL (ref 1.7–2.4)

## 2024-01-26 LAB — APTT: aPTT: 41 s — ABNORMAL HIGH (ref 24–36)

## 2024-01-26 MED ORDER — SACUBITRIL-VALSARTAN 24-26 MG PO TABS
1.0000 | ORAL_TABLET | Freq: Two times a day (BID) | ORAL | Status: DC
  Administered 2024-01-26 – 2024-01-29 (×7): 1 via ORAL
  Filled 2024-01-26 (×8): qty 1

## 2024-01-26 MED ORDER — ENSURE PLUS HIGH PROTEIN PO LIQD
237.0000 mL | Freq: Two times a day (BID) | ORAL | Status: DC
  Administered 2024-01-27 – 2024-01-30 (×5): 237 mL via ORAL

## 2024-01-26 MED ORDER — AMOXICILLIN-POT CLAVULANATE 400-57 MG/5ML PO SUSR
875.0000 mg | Freq: Two times a day (BID) | ORAL | Status: DC
  Administered 2024-01-26 – 2024-01-30 (×9): 875 mg via ORAL
  Filled 2024-01-26: qty 10.94
  Filled 2024-01-26: qty 15
  Filled 2024-01-26: qty 10.94
  Filled 2024-01-26: qty 15
  Filled 2024-01-26 (×5): qty 10.94
  Filled 2024-01-26: qty 15

## 2024-01-26 MED ORDER — TRAVASOL 10 % IV SOLN
INTRAVENOUS | Status: AC
  Filled 2024-01-26: qty 1100.2

## 2024-01-26 MED ORDER — SPIRONOLACTONE 12.5 MG HALF TABLET
12.5000 mg | ORAL_TABLET | Freq: Every day | ORAL | Status: DC
  Administered 2024-01-26 – 2024-01-29 (×4): 12.5 mg via ORAL
  Filled 2024-01-26 (×4): qty 1

## 2024-01-26 MED ORDER — ADULT MULTIVITAMIN W/MINERALS CH
1.0000 | ORAL_TABLET | Freq: Every day | ORAL | Status: DC
  Administered 2024-01-28 – 2024-01-30 (×3): 1 via ORAL
  Filled 2024-01-26 (×4): qty 1

## 2024-01-26 MED ORDER — FUROSEMIDE 10 MG/ML IJ SOLN
40.0000 mg | Freq: Once | INTRAMUSCULAR | Status: AC
  Administered 2024-01-26: 40 mg via INTRAVENOUS
  Filled 2024-01-26: qty 4

## 2024-01-26 MED ORDER — POTASSIUM CHLORIDE 20 MEQ PO PACK
40.0000 meq | PACK | Freq: Once | ORAL | Status: AC
Start: 2024-01-26 — End: 2024-01-26
  Administered 2024-01-26: 40 meq via ORAL
  Filled 2024-01-26: qty 2

## 2024-01-26 MED ORDER — SIMETHICONE 80 MG PO CHEW
80.0000 mg | CHEWABLE_TABLET | Freq: Four times a day (QID) | ORAL | Status: DC | PRN
  Administered 2024-01-26 – 2024-01-28 (×5): 80 mg via ORAL
  Filled 2024-01-26 (×6): qty 1

## 2024-01-26 MED ORDER — FLUCONAZOLE 40 MG/ML PO SUSR
200.0000 mg | Freq: Every day | ORAL | Status: DC
  Administered 2024-01-26 – 2024-01-30 (×5): 200 mg via ORAL
  Filled 2024-01-26 (×7): qty 5

## 2024-01-26 NOTE — Progress Notes (Signed)
 PHARMACY - TOTAL PARENTERAL NUTRITION CONSULT NOTE   Indication: intolerance to enteral feedings, esophageal performation  Patient Measurements: Height: 6' 1 (185.4 cm) Weight: 76.3 kg (168 lb 3.4 oz) IBW/kg (Calculated) : 79.9 TPN AdjBW (KG): 72.8 Body mass index is 22.19 kg/m.  Assessment:  73 year old man with medical history significant for cardiac arrest earlier this year who was admitted after several days of nausea and vomiting from a stomach bug followed by sudden onset severe tearing epigastric pain radiating to back and chest. 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 Pharmacy consulted to initiate TPN due to inability to tolerate enteral nutrition at this time.  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 PTA takes torsemide  20 mg PO daily, now held. Aggressive fluid resuscitation recommended per GI. Per HF, patient currently euvolemic but will hold diuretic for now. Due to CHF will target lower end of fluid range for TPN and supplement additional fluids per MD outside of TPN to prevent fluid overload.   Stent placed on 10/6, esophogram not showing any leak. Diet advanced to CLD.   Glucose / Insulin : A1C (02/2023) 6.0; CBGs 130-160s, mSSI (15u/24h)  Electrolytes: K 3.8, Na 141, Mg 2.0, Phos 3.1, CoCa 10.0, others wnl, HCO3: 23, Cl: 105  Renal: Scr downtrending 2.07 (bsl ~2), BUN 56 Hepatic: AST/ALT wnl, tbili 0.5, alb 2.2 Intake / Output; MIVF: UOP charted 800 mL, LBM 10/9, bisacodyl , net + 4.8L   GI Imaging: 10/06 Esophagogram/barium study: distal esophageal perforation 10/05 CT: concerning for esophageal perforation, masslike area of soft tissue thickening adjacent to and surrounding the esophagus - differential considerations remain esophageal mass, hiatal hernia, or infection GI Surgeries / Procedures:  10/6: esophageal stent placement   Central access: 01/22/24 TPN start date: 01/22/24  Nutritional Goals: Goal TPN rate  is 80 mL/hr (provides ~110 g of protein and 2101 kcals per day)  RD Assessment: pending Estimated Needs Total Energy Estimated Needs: 2100-2300 kcals Total Protein Estimated Needs: 110-130 g Total Fluid Estimated Needs: 2L  Current Nutrition:  NPO 10/5 >>10/8 CLD 10/8 >>  TPN 10/6 >>  Plan:  Continue TPN to goal 80 mL/hr at 1800 to meet 100% total nutritional needs. Monitor volume status, low threshold to concentrate TPN given history of HF. Currently minimal free water  in TPN.  Electrolytes in TPN: Na 25 mEq/L, K 10 mEq/L (19 mEq total), Ca 3 mEq/L, Mg 3 mEq/L, and Phos 10 mmol/L. Cl:Ac 1:2 to max acetate 30mEq IV KCL this AM.  Add standard MVI and trace elements to TPN Continue  Moderate q4h SSI and adjust as needed.  Add thiamine 100 mg IV x5 days per RD recommendation, EOT of 10/11.  Monitor TPN labs on Mon/Thurs, daily while initiating TPN  Thank you for allowing pharmacy to be a part of this patient's care.  Shelba Collier, PharmD, BCPS Clinical Pharmacist

## 2024-01-26 NOTE — Progress Notes (Signed)
 Nutrition Brief Note  Patient seen by RD yesterday who is recommending TPN continue to provide 100% of estimated calorie/protein needs until diet advanced past liquids and he is tolerating and meeting at least 50% of his estimated needs.   Diet advanced to dysphagia 3 this morning. RN reports pain with food intake. Will initiate calorie count for over the weekend to assess intake and ability to wean nutrition support. Will also add supplements to augment intake. Discussed with RN and envelope hung.    INTERVENTION:  Continue TPN per pharmacy MVI/Thiamine supplementation added to TPN Continue TPN until diet advanced past liquids, pt demonstrating tolerance of puree type consistency and eating adequately. Pt has poor appetite and likely will not be able to consume enough ONS to justify discontinuation of TPN as pt reports he doe snot tolerate ONS well.    Add Ensure Plus High Protein po BID, each supplement provides 350 kcal and 20 grams of protein Add Mighty Shake TID with meals, each supplement provides 330 kcals and 9 grams of protein *Prefers Vanilla Transition to oral MVI on 10/12   NUTRITION DIAGNOSIS:    Moderate Malnutrition related to acute illness (may have chronic component due to time frame of reported wt loss) as evidenced by mild fat depletion, percent weight loss, energy intake < or equal to 50% for > or equal to 5 days. - Ongoing    GOAL:    Patient will meet greater than or equal to 90% of their needs - Meeting via TPN   Blair Deaner MS, RD, LDN Registered Dietitian Clinical Nutrition RD Inpatient Contact Info in Amion

## 2024-01-26 NOTE — Progress Notes (Signed)
     [address]       [name] [address]             [phone]       Pt has been having hemoptysis. Likely related to stent irritation Ok to start hep gtt tomorrow If bleeding worsens, hold help, and will plan for repeat endoscopy  Gilmar Bua O Hayzel Ruberg

## 2024-01-26 NOTE — Progress Notes (Addendum)
 Advanced Heart Failure Rounding Note  Cardiologist: Dorn Lesches, MD  Chief Complaint: S/P esophageal stent Subjective:    CVP 10-11 HR controlled 70-80s on amio 30/hr.  I/Os incomplete. Weight down 5lbs.  Hgb 11.1>10.1 sCr stable 2.07  Lying in bed, feeling a little better today, although still struggling with abdominal pain and cough. Coughing up thick, mucus-y, bloody secretions. Did not sleep well overnight. Did have some shortness of breath yesterday that has improved. Did not want to work with PT/OT this morning.   Objective:    Weight Range: 76.3 kg Body mass index is 22.19 kg/m.   Vital Signs:   Temp:  [98.3 F (36.8 C)-99 F (37.2 C)] 98.3 F (36.8 C) (10/10 0801) Pulse Rate:  [79-99] 95 (10/10 0303) Resp:  [18-22] 20 (10/10 0303) BP: (119-153)/(59-77) 126/75 (10/10 0801) SpO2:  [93 %-97 %] 97 % (10/10 0303) Weight:  [76.3 kg] 76.3 kg (10/10 0433) Last BM Date : 01/25/24  Weight change: Filed Weights   01/24/24 0500 01/25/24 0500 01/26/24 0433  Weight: 78.5 kg 78.9 kg 76.3 kg   Intake/Output:  Intake/Output Summary (Last 24 hours) at 01/26/2024 0940 Last data filed at 01/26/2024 0900 Gross per 24 hour  Intake 1976.61 ml  Output 800 ml  Net 1176.61 ml    Physical Exam   General: Elderly, pale appearing. No distress on [place] Cardiac: JVP ~8cm. S1 and S2 present. No murmurs or rub. Extremities: Warm and dry.  No peripheral edema.  Neuro: Alert and oriented x3. Affect flat  Telemetry   AF 70-80s (personally reviewed)  Labs    CBC Recent Labs    01/25/24 0307 01/26/24 0320  WBC 11.7* 11.9*  HGB 11.1* 10.1*  HCT 35.8* 32.4*  MCV 89.1 88.3  PLT 235 209   Basic Metabolic Panel Recent Labs    89/91/74 0208 01/25/24 0307 01/25/24 0855 01/25/24 1053 01/26/24 0320  NA 140 139  --   --   --   K 3.9 3.3*   < > 3.3* 3.3*  CL 106 106  --   --   --   CO2 26 21*  --   --   --   GLUCOSE 171* 167*  --   --   --   BUN 57* 54*  --   --   --    CREATININE 2.42* 2.10*  --   --   --   CALCIUM  8.9 8.6*  --   --   --   MG 2.3 2.0  --   --  2.0  PHOS 1.7* 3.1  --   --   --    < > = values in this interval not displayed.   Liver Function Tests Recent Labs    01/25/24 0307  AST 18  ALT 17  ALKPHOS 45  BILITOT 1.3*  PROT 5.8*  ALBUMIN  2.2*   BNP (last 3 results) Recent Labs    03/09/23 1544 03/11/23 0218 06/27/23 1116  BNP 660.9* 491.3* 646.4*   Hemoglobin A1C Recent Labs    01/23/24 1202  HGBA1C 6.0*   Fasting Lipid Panel No results for input(s): CHOL, HDL, LDLCALC, TRIG, CHOLHDL, LDLDIRECT in the last 72 hours.  Medications:    Scheduled Medications:  amoxicillin-clavulanate  875 mg Oral Q12H   bisacodyl   10 mg Rectal Q0600   Chlorhexidine  Gluconate Cloth  6 each Topical Daily   insulin  aspart  10 Units Intravenous Once   And   dextrose   1 ampule Intravenous  Once   fluconazole  200 mg Oral Daily   insulin  aspart  0-15 Units Subcutaneous Q4H   methocarbamol   500 mg Oral TID   mupirocin ointment  1 Application Nasal BID   pantoprazole  (PROTONIX ) IV  40 mg Intravenous QHS   sodium chloride  flush  10-40 mL Intracatheter Q12H    Infusions:  amiodarone  30 mg/hr (01/26/24 0900)   calcium  gluconate     famotidine (PEPCID) IV Stopped (01/26/24 0347)   TPN ADULT (ION) 80 mL/hr at 01/26/24 0900    PRN Medications: benzonatate, HYDROmorphone  (DILAUDID ) injection, levalbuterol, methocarbamol  (ROBAXIN ) injection, ondansetron  (ZOFRAN ) IV, mouth rinse, oxyCODONE , sodium chloride  flush  Patient Profile    Patient with longstanding history of chronic heart failure with preserved ejection fraction, RV failure with history of pulmonary embolism presents with esophageal perforation.  S/P Stent Esophageal Stent   Assessment/Plan   1.  Esophageal Perforation - CT C/A/P: with pneumomediastinum and large mass-like density in lower esophagus - Prior EGD 3/25: erosive gastritis, duodenal deformity, and  bleeding friable duodenal mucosa s/p hemostatic 01/23/24 S/P EGD with esophageal stent placed  - On TPN; clear liquid diet - PO meds ok per Dr. Shyrl - pain poorly controlled, will have RN give PRN pain medication - schedule robaxin  q8h  2. Chronic HFpEF: h/o cardiogenic shock with RV failure in the setting of cardiac arrest thought to be caused by acute PE vs ACS in 3/25. Echo at the time showed EF 55%, severe, LVH, G2DD, and severely reduced RV function with strain. There was concern for cardiac sarcoid amyloid, however CMR LGE consistent with ischemic disease. RV on echo 9/25 with complete recovery, EF 60-65%, nl RV function.    - RHC 3/25: Mild PAH with severe RV failure though CO is preserved.   - CVP 10-11. Give IV Lasix  40 mg today - add low dose Entresto  24/26 mg bid (on 97/103 bid at home) - add spiro 12.5 mg daily, follow Cr - check co-ox this morning - GDMT limited by renal function    3. mvCAD:  - LHC 3/25: 3v CAD with CTO RCA with L->R collaterals and moderate non-obstructive CAD in L system   4.  CKD Stage 3b: Baseline sCr ~ 2.1. - AKI resolved - Cr 2.1 today, at baseline    5. PAF:   - s/p TEE/DCCV 3/25 - developed AF with RVR, unsure when as tele is not available  - continue 30/hr for rate control - off heparin  with hemoptysis   6.  H/o DVT/PE:  Bilateral upper and lower DVTs, mixed acute and chronic.  - s/p infrarenal IVC placement 3/25 - V/Q 3/25 with multiple perfusion defects - will need lifelong AC, add once bleeding stops and per TCTS   7. Carotid stenosis: h/o CVA. TCAR in 6/24. Okay to remain off plavix  at this point. - carotid US  9/25: widely patent carotid stent on R and no stenosis on L   Length of Stay: 5  [place] Lee, NP  01/26/2024, 9:40 AM  Advanced Heart Failure Team Pager [phone] (M-F; 7a - 5p)  Please contact [facility] for night-coverage after hours (5p -7a ) and weekends on [email or web address]   Patient seen and examined with the  above-signed Advanced Practice Provider and/or Housestaff. I personally reviewed laboratory data, imaging studies and relevant notes. I independently examined the patient and formulated the important aspects of the plan. I have edited the note to reflect any of my changes or salient points. I have personally discussed the plan  with the patient and/or family.  Still coughing up some blood. Diuresing well on IV lasix . AF now rate controlled on IV amio.  Overall feeling better. Denies CP or SOB. Renal function back to baseline   Remains anxious  General:  Sitting up in bed. No resp difficulty HEENT: normal Neck: supple. JVP 10. Carotids 2+ bilat; no bruits. No lymphadenopathy or thryomegaly appreciated. Cor: Irregular rate & rhythm. No rubs, gallops or murmurs. Lungs: clear Abdomen: soft, nontender, nondistended. No hepatosplenomegaly. No bruits or masses. Good bowel sounds. Extremities: no cyanosis, clubbing, rash, 1+ edema Neuro: alert & orientedx3, cranial nerves grossly intact. moves all 4 extremities w/o difficulty. Affect pleasant  Continue IV diuresis for one more day. Continue amio for PAF. Not candidate for anticoagulation at this point. Once able to start [facility] can consider TEE/DCCV (if TEE ok with TCTS). Restart GDMT slowly.   Toribio Fuel, MD  11:17 AM

## 2024-01-26 NOTE — Progress Notes (Signed)
 Mobility Specialist Progress Note;   01/26/24 1200  Mobility  Activity Ambulated with assistance;Pivoted/transferred to/from [facility]  Level of Assistance Standby assist, set-up cues, supervision of patient - no hands on  Assistive Device Front wheel walker  Distance Ambulated (ft) 200 ft  Activity Response Tolerated well  Mobility Referral Yes  Mobility visit 1 Mobility  Mobility Specialist Start Time (ACUTE ONLY) 1200  Mobility Specialist Stop Time (ACUTE ONLY) 1224  Mobility Specialist Time Calculation (min) (ACUTE ONLY) 24 min   Pt agreeable to session w/ co-treat from OT. Required MinG for bed mobility and SV/MinG for safe mobility around unit and room. Did not require any seated rest breaks this session. Ambulated on 2LO2 throughout w/ no c/o SOB. Pt requested to use B[facility] at [facility] Pt left with call bell, instructed to call once finished. NT notified.   Lauraine Erm Mobility Specialist Please contact via SecureChat or [employer] [phone]

## 2024-01-26 NOTE — Progress Notes (Signed)
 PROGRESS NOTE  Billy [name]:[id number] DOB: 06-18-1950 DOA: 01/21/2024 PCP: Seabron Lenis, MD   LOS: 5 days   Brief Narrative / Interim history: Billy [name] is a 72 y.o. male with a history of HFrEF, AFib, CAD, PE complicated by cardiogenic shock who presented to the ED on 01/21/2024 with several days of vomiting followed by an episode of severe tearing epigastric pain. He was found to have an esophageal tear. GI and cardiothoracic surgery were consulted, took for EGD with stent placement 10/7.   Subjective / 24h Interval events: Still with some chest discomfort.  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 Shyrl 10/7.  Repeat swallow study did not show any leak, okay for diet - I discussed with Dr. Shyrl over the phone on 10/9, since he is tolerating liquids will advance to dysphagia/soft diet for which he will need to remain on for several weeks.  Antibiotics will be transition to liquid formulation, plan for total of 6 weeks. - From cardiothoracic standpoint, he could go home once cleared by cardiology   Active problems AKI on CKD3b -baseline creatinine in the low 2s, and stable today   PAF, with RVR, history of DVT/PE -discussed with CT surgery, given ongoing intermittent bleeding, hold anticoagulation for now, will need to be reevaluated as an outpatient prior to resuming -Due to RVR now started on amiodarone  infusion   Chronic HFrEF -appreciate heart failure follow-up, getting diuresed, further management per CHF team.  To be started on GDMT as able   Hyperkalemia -K stable today  Scheduled Meds:  amoxicillin-clavulanate  875 mg Oral Q12H   bisacodyl   10 mg Rectal Q0600   Chlorhexidine  Gluconate Cloth  6 each Topical Daily   insulin  aspart  10 Units Intravenous Once   And   dextrose   1 ampule Intravenous Once    fluconazole  200 mg Oral Daily   furosemide   40 mg Intravenous Once   insulin  aspart  0-15 Units Subcutaneous Q4H   methocarbamol   500 mg Oral TID   mupirocin ointment  1 Application Nasal BID   pantoprazole  (PROTONIX ) IV  40 mg Intravenous QHS   sacubitril -valsartan   1 tablet Oral BID   sodium chloride  flush  10-40 mL Intracatheter Q12H   spironolactone   12.5 mg Oral Daily   Continuous Infusions:  amiodarone  30 mg/hr (01/26/24 1018)   calcium  gluconate     famotidine (PEPCID) IV Stopped (01/26/24 0347)   TPN ADULT (ION) 80 mL/hr at 01/26/24 0900   TPN ADULT (ION)     PRN Meds:.benzonatate, HYDROmorphone  (DILAUDID ) injection, levalbuterol, methocarbamol  (ROBAXIN ) injection, ondansetron  (ZOFRAN ) IV, mouth rinse, oxyCODONE , sodium chloride  flush  Current Outpatient Medications  Medication Instructions   apixaban  (ELIQUIS ) 5 mg, Oral, 2 times daily   atorvastatin  (LIPITOR ) 80 mg, Oral, Daily   ezetimibe  (ZETIA ) 10 mg, Oral, Daily   ibuprofen (ADVIL) 400-600 mg, Oral, 2 times daily PRN   Multiple Vitamins-Minerals (MENS 50+ MULTIVITAMIN) TABS 1 tablet, Oral, Daily   sacubitril -valsartan  (ENTRESTO ) 97-103 MG 1 tablet, Oral, 2 times daily   torsemide  (DEMADEX ) 20 mg, Oral, Daily   Diet Orders (From admission, onward)     Start     Ordered   01/26/24 0741  DIET DYS 3 Room service appropriate? Yes; Fluid consistency: Thin  Diet effective now       Question Answer Comment  Room service  appropriate? Yes   Fluid consistency: Thin      01/26/24 0741            DVT prophylaxis: SCDs Start: 01/21/24 2023   Lab Results  Component Value Date   PLT 209 01/26/2024      Code Status: Full Code  Family Communication: No family at bedside  Status is: Inpatient Remains inpatient appropriate because: Severity of illness    Level of care: Progressive  Consultants:  Cardiothoracic surgery Cardiology  Objective: Vitals:   01/26/24 0303 01/26/24 0433 01/26/24 0801 01/26/24  1124  BP: (!) 145/72  126/75 136/69  Pulse: 95     Resp: 20   16  Temp: 98.4 F (36.9 C)  98.3 F (36.8 C) 98.4 F (36.9 C)  TempSrc: Oral  Oral Oral  SpO2: 97%     Weight:  76.3 kg    Height:        Intake/Output Summary (Last 24 hours) at 01/26/2024 1222 Last data filed at 01/26/2024 0900 Gross per 24 hour  Intake 1976.61 ml  Output 800 ml  Net 1176.61 ml   Wt Readings from Last 3 Encounters:  01/26/24 76.3 kg  12/19/23 75.7 kg  09/27/23 74.8 kg    Examination:  Constitutional: NAD Eyes: lids and conjunctivae normal, no scleral icterus ENMT: mmm Neck: normal, supple Respiratory: clear to auscultation bilaterally, no wheezing, no crackles.  Cardiovascular: irregular. No LE edema. Abdomen: soft, no distention, no tenderness. Bowel sounds positive.    Data Reviewed: I have independently reviewed following labs and imaging studies   CBC Recent Labs  Lab 01/21/24 1439 01/21/24 1500 01/22/24 0215 01/24/24 0208 01/25/24 0307 01/26/24 0320  WBC 9.5  --  6.2 8.7 11.7* 11.9*  HGB 14.8 15.3 13.5 10.8* 11.1* 10.1*  HCT 47.9 45.0 44.5 34.4* 35.8* 32.4*  PLT 208  --  151 186 235 209  MCV 89.9  --  90.6 88.2 89.1 88.3  MCH 27.8  --  27.5 27.7 27.6 27.5  MCHC 30.9  --  30.3 31.4 31.0 31.2  RDW 18.0*  --  18.0* 18.6* 18.5* 18.5*  LYMPHSABS 0.7  --   --   --   --   --   MONOABS 0.6  --   --   --   --   --   EOSABS 0.1  --   --   --   --   --   BASOSABS 0.0  --   --   --   --   --     Recent Labs  Lab 01/21/24 1551 01/21/24 1728 01/22/24 0816 01/22/24 1611 01/22/24 2035 01/23/24 0019 01/23/24 0430 01/23/24 1041 01/23/24 1202 01/23/24 1931 01/24/24 0208 01/25/24 0307 01/25/24 0855 01/25/24 1053 01/26/24 0320 01/26/24 0831  NA 143 139 140  --  142  --  142  --   --   --  140 139  --   --   --  141  K 2.8* 3.8 5.8*   < > 5.6*   < > 5.5*   < >  --    < > 3.9 3.3* 4.0 3.3* 3.3* 3.8  CL 118* 104 107  --  108  --  106  --   --   --  106 106  --   --   --   105  CO2 17* 23 26  --  23  --  25  --   --   --  26 21*  --   --   --  23  GLUCOSE 165* 210* 105*  --  140*  --  175*  --   --   --  171* 167*  --   --   --  137*  BUN 37* 49* 54*  --  55*  --  56*  --   --   --  57* 54*  --   --   --  56*  CREATININE 1.43* 2.15* 2.69*  --  2.71*  --  2.68*  --   --   --  2.42* 2.10*  --   --   --  2.07*  CALCIUM  5.5* 8.2* 8.5*  --  9.8  --  9.2  --   --   --  8.9 8.6*  --   --   --  8.5*  AST 21  --   --   --   --   --   --   --   --   --   --  18  --   --   --   --   ALT 21  --   --   --   --   --   --   --   --   --   --  17  --   --   --   --   ALKPHOS 33*  --   --   --   --   --   --   --   --   --   --  45  --   --   --   --   BILITOT 0.5  --   --   --   --   --   --   --   --   --   --  1.3*  --   --   --   --   ALBUMIN  2.0*  --   --   --   --   --   --   --   --   --   --  2.2*  --   --   --   --   MG  --   --  2.5*  --   --   --  2.4  --   --   --  2.3 2.0  --   --  2.0  --   INR  --  1.3*  --   --   --   --   --   --   --   --   --   --   --   --   --   --   HGBA1C  --   --   --   --   --   --   --   --  6.0*  --   --   --   --   --   --   --    < > = values in this interval not displayed.    ------------------------------------------------------------------------------------------------------------------ No results for input(s): CHOL, HDL, LDLCALC, TRIG, CHOLHDL, LDLDIRECT in the last 72 hours.   Lab Results  Component Value Date   HGBA1C 6.0 (H) 01/23/2024   ------------------------------------------------------------------------------------------------------------------ No results for input(s): TSH, T4TOTAL, T3FREE, THYROIDAB in the last 72 hours.  Invalid input(s): FREET3  Cardiac Enzymes No results for input(s): CKMB, TROPONINI, MYOGLOBIN in the last 168 hours.  Invalid input(s): CK ------------------------------------------------------------------------------------------------------------------     Component Value Date/Time   BNP 646.4 (H) 06/27/2023  1116    CBG: Recent Labs  Lab 01/25/24 1932 01/25/24 2311 01/26/24 0308 01/26/24 0800 01/26/24 1124  GLUCAP 152* 149* 146* 169* 143*    Recent Results (from the past 240 hours)  MRSA Next Gen by PCR, Nasal     Status: Abnormal   Collection Time: 01/21/24  9:27 PM   Specimen: Nasal Mucosa; Nasal Swab  Result Value Ref Range Status   MRSA by PCR Next Gen DETECTED (A) NOT DETECTED Final    Comment: RESULT CALLED TO, READ BACK BY AND VERIFIED WITH: J MCBRIDE RN 01/21/2024 @ 2334  (NOTE) The GeneXpert MRSA Assay (FDA approved for NASAL specimens only), is one component of a comprehensive MRSA colonization surveillance program. It is not intended to diagnose MRSA infection nor to guide or monitor treatment for MRSA infections. Test performance is not FDA approved in patients less than 69 years old. Performed at [facility], [address], [place], [place] [address]      Radiology Studies: No results found.   Nilda Fendt, MD, PhD [facility]  Between 7 am - 7 pm I am available, please contact me via Amion (for emergencies) or Securechat (non urgent messages)  Between 7 pm - 7 am I am not available, please contact night coverage MD/APP via Amion

## 2024-01-26 NOTE — Progress Notes (Signed)
 OT Cancellation Note  Patient Details Name: Kaegan Hettich MRN: [id number] DOB: 1950/05/25   Cancelled Treatment:    Reason Eval/Treat Not Completed: Other (comment).  Attempted skilled OT treatment session with pt. ,he reviews that he already declined the other therapy that came by here and has asked them to come back after 12:30pm.  Pt. Aware this therapist asst. Will attempt back as schedule allows.    CHRISTELLA Nest Lorraine-COTA/L  01/26/2024, 10:09 AM

## 2024-01-26 NOTE — Progress Notes (Signed)
 Occupational Therapy Treatment Patient Details Name: Billy [name] MRN: [id number] DOB: Jan 13, 1951 Today's Date: 01/26/2024   History of present illness Billy [name] is a 73 y.o. male who presented to [facility] ED 01/21/24 with several days of vomiting followed by an episode of severe tearing epigastric pain. Work-up revealed esophageal tear. Pt s/p esophageal stent with EGD 10/7. PMHx: HFrEF, AFib, CAD, PE complicated by cardiogenic shock, CKD 4, MI, peripheral neuropathy, and TIA.   OT comments  Pt. Seen for skilled OT treatment session with mobility tech. Also present.  Pt. Is a one person assist for mobility but required 2nd person for equipment management and chair follow if needed.  Pt. Able to demo figure 4 for LB dressing in sitting and S with cues for hand placement for 3N1 transfer.  Pt. Able to ambulate from room around entire unit with CGA/S cues to slow pace while trying to manage equipment/IV pole. (Refer to mobility notes for details).  Pt. Progressing well.  Agree with current d/c recommendations and will cont with acute OT POC.        If plan is discharge home, recommend the following:  A little help with walking and/or transfers;A lot of help with bathing/dressing/bathroom;Assistance with cooking/housework;Assist for transportation;Help with stairs or ramp for entrance   Equipment Recommendations       Recommendations for Other Services      Precautions / Restrictions Precautions Precautions: Fall Recall of Precautions/Restrictions: Intact       Mobility Bed Mobility Overal bed mobility: Needs Assistance Bed Mobility: Supine to Sit     Supine to sit: Min assist     General bed mobility comments: pt. sat up and was able to bring BLEs towards eob, requesting mobility tech. provide back support as he came into sitting and brought bles off of bed but he did not physically need assist to transtion into sitting    Transfers Overall transfer level: Needs assistance Equipment  used: Rolling walker (2 wheels) Transfers: Sit to/from Stand, Bed to chair/wheelchair/BSC Sit to Stand: Supervision     Step pivot transfers: Supervision     General transfer comment: S during transfers and mobility, A required for equipment mangagement and chair follow.     Balance                                           ADL either performed or assessed with clinical judgement   ADL Overall ADL's : Needs assistance/impaired                     Lower Body Dressing: Set up;Sitting/lateral leans Lower Body Dressing Details (indicate cue type and reason): able to demonstrate figure 4 for BLEs.  educated on benefits of this strategy for fall prevention Toilet Transfer: Contact guard assist;Ambulation;Rolling walker (2 wheels);BSC/3in1;Cueing for sequencing Toilet Transfer Details (indicate cue type and reason): during ambulation into room, 3n1 placed behind him secondary to reported urgency. cues for hand placement but able to reach for arm rests with instruction         Functional mobility during ADLs: Contact guard assist;Rolling walker (2 wheels) General ADL Comments: seen with mobiltiy tech for safety during mobility with tube/line management/equipment    Extremity/Trunk Assessment              Vision       Perception     Praxis  Communication     Cognition Arousal: Alert Behavior During Therapy: WFL for tasks assessed/performed Cognition: No apparent impairments                               Following commands: Intact        Cueing   Cueing Techniques: Verbal cues  Exercises      Shoulder Instructions       General Comments      Pertinent Vitals/ Pain       Pain Assessment Pain Assessment: Faces Faces Pain Scale: Hurts a little bit Pain Location: abdomen Pain Descriptors / Indicators: Discomfort, Aching, Sore, Pressure Pain Intervention(s): Limited activity within patient's tolerance, Monitored  during session, Repositioned  Home Living                                          Prior Functioning/Environment              Frequency  Min 2X/week        Progress Toward Goals  OT Goals(current goals can now be found in the care plan section)  Progress towards OT goals: Progressing toward goals     Plan      Co-evaluation                 AM-PAC OT 6 Clicks Daily Activity     Outcome Measure   Help from another person eating meals?: None Help from another person taking care of personal grooming?: A Little Help from another person toileting, which includes using toliet, bedpan, or urinal?: A Little Help from another person bathing (including washing, rinsing, drying)?: A Lot Help from another person to put on and taking off regular upper body clothing?: A Little Help from another person to put on and taking off regular lower body clothing?: A Lot 6 Click Score: 17    End of Session Equipment Utilized During Treatment: Gait belt;Rolling walker (2 wheels);Oxygen   OT Visit Diagnosis: Unsteadiness on feet (R26.81);Other abnormalities of gait and mobility (R26.89);Pain;Muscle weakness (generalized) (M62.81)   Activity Tolerance Patient tolerated treatment well   Patient Left Other (comment) (left on 3n1 in room with call bell in reach, CNA aware to assist when finished using the b.room)   Nurse Communication Mobility status;Other (comment) (alerted cna pt. will use call bell for notification once finished using the b.room)        Time: 8799-8775 OT Time Calculation (min): 24 min  Charges: OT General Charges $OT Visit: 1 Visit OT Treatments $Self Care/Home Management : 23-37 mins  Randall, COTA/L Acute Rehabilitation [phone]   CHRISTELLA Nest Lorraine-COTA/L  01/26/2024, 12:42 PM

## 2024-01-27 DIAGNOSIS — I480 Paroxysmal atrial fibrillation: Secondary | ICD-10-CM | POA: Diagnosis not present

## 2024-01-27 DIAGNOSIS — I5032 Chronic diastolic (congestive) heart failure: Secondary | ICD-10-CM | POA: Diagnosis not present

## 2024-01-27 DIAGNOSIS — K223 Perforation of esophagus: Secondary | ICD-10-CM | POA: Diagnosis not present

## 2024-01-27 DIAGNOSIS — I2510 Atherosclerotic heart disease of native coronary artery without angina pectoris: Secondary | ICD-10-CM | POA: Diagnosis not present

## 2024-01-27 DIAGNOSIS — N1832 Chronic kidney disease, stage 3b: Secondary | ICD-10-CM

## 2024-01-27 LAB — COMPREHENSIVE METABOLIC PANEL WITH GFR
ALT: 30 U/L (ref 0–44)
AST: 34 U/L (ref 15–41)
Albumin: 1.8 g/dL — ABNORMAL LOW (ref 3.5–5.0)
Alkaline Phosphatase: 94 U/L (ref 38–126)
Anion gap: 10 (ref 5–15)
BUN: 54 mg/dL — ABNORMAL HIGH (ref 8–23)
CO2: 23 mmol/L (ref 22–32)
Calcium: 8.4 mg/dL — ABNORMAL LOW (ref 8.9–10.3)
Chloride: 106 mmol/L (ref 98–111)
Creatinine, Ser: 1.8 mg/dL — ABNORMAL HIGH (ref 0.61–1.24)
GFR, Estimated: 40 mL/min — ABNORMAL LOW (ref >60)
Glucose, Bld: 175 mg/dL — ABNORMAL HIGH (ref 70–99)
Potassium: 3.3 mmol/L — ABNORMAL LOW (ref 3.5–5.1)
Sodium: 139 mmol/L (ref 135–145)
Total Bilirubin: 0.8 mg/dL (ref 0.0–1.2)
Total Protein: 5.3 g/dL — ABNORMAL LOW (ref 6.5–8.1)

## 2024-01-27 LAB — CBC
HCT: 31.9 % — ABNORMAL LOW (ref 39.0–52.0)
Hemoglobin: 10.2 g/dL — ABNORMAL LOW (ref 13.0–17.0)
MCH: 27.8 pg (ref 26.0–34.0)
MCHC: 32 g/dL (ref 30.0–36.0)
MCV: 86.9 fL (ref 80.0–100.0)
Platelets: 229 K/uL (ref 150–400)
RBC: 3.67 MIL/uL — ABNORMAL LOW (ref 4.22–5.81)
RDW: 18.4 % — ABNORMAL HIGH (ref 11.5–15.5)
WBC: 14.2 K/uL — ABNORMAL HIGH (ref 4.0–10.5)
nRBC: 0.2 % (ref 0.0–0.2)

## 2024-01-27 LAB — GLUCOSE, CAPILLARY
Glucose-Capillary: 177 mg/dL — ABNORMAL HIGH (ref 70–99)
Glucose-Capillary: 209 mg/dL — ABNORMAL HIGH (ref 70–99)
Glucose-Capillary: 136 mg/dL — ABNORMAL HIGH (ref 70–99)
Glucose-Capillary: 171 mg/dL — ABNORMAL HIGH (ref 70–99)
Glucose-Capillary: 146 mg/dL — ABNORMAL HIGH (ref 70–99)

## 2024-01-27 LAB — MAGNESIUM: Magnesium: 2.1 mg/dL (ref 1.7–2.4)

## 2024-01-27 MED ORDER — TRAVASOL 10 % IV SOLN
INTRAVENOUS | Status: AC
  Filled 2024-01-27: qty 1100.2

## 2024-01-27 MED ORDER — HEPARIN (PORCINE) 25000 UT/250ML-% IV SOLN
1850.0000 [IU]/h | INTRAVENOUS | Status: DC
  Administered 2024-01-27: 600 [IU]/h via INTRAVENOUS
  Administered 2024-01-28: 750 [IU]/h via INTRAVENOUS
  Administered 2024-01-29: 1200 [IU]/h via INTRAVENOUS
  Administered 2024-01-30: 1400 [IU]/h via INTRAVENOUS
  Administered 2024-01-31: 1700 [IU]/h via INTRAVENOUS
  Filled 2024-01-27 (×5): qty 250

## 2024-01-27 MED ORDER — LEVALBUTEROL HCL 0.63 MG/3ML IN NEBU
0.6300 mg | INHALATION_SOLUTION | Freq: Four times a day (QID) | RESPIRATORY_TRACT | Status: DC
  Administered 2024-01-27 – 2024-02-02 (×18): 0.63 mg via RESPIRATORY_TRACT
  Filled 2024-01-27 (×23): qty 3

## 2024-01-27 MED ORDER — FUROSEMIDE 40 MG PO TABS
40.0000 mg | ORAL_TABLET | Freq: Every day | ORAL | Status: DC
  Administered 2024-01-27 – 2024-01-29 (×3): 40 mg via ORAL
  Filled 2024-01-27 (×3): qty 1

## 2024-01-27 MED ORDER — POTASSIUM CHLORIDE 20 MEQ PO PACK
40.0000 meq | PACK | Freq: Once | ORAL | Status: AC
  Administered 2024-01-27: 40 meq via ORAL
  Filled 2024-01-27: qty 2

## 2024-01-27 MED ORDER — INSULIN ASPART 100 UNIT/ML IJ SOLN
0.0000 [IU] | Freq: Four times a day (QID) | INTRAMUSCULAR | Status: DC
  Administered 2024-01-27 (×2): 3 [IU] via SUBCUTANEOUS
  Administered 2024-01-28: 2 [IU] via SUBCUTANEOUS
  Administered 2024-01-28 (×2): 3 [IU] via SUBCUTANEOUS
  Administered 2024-01-28 – 2024-01-29 (×3): 2 [IU] via SUBCUTANEOUS
  Administered 2024-01-29: 3 [IU] via SUBCUTANEOUS
  Administered 2024-01-30: 2 [IU] via SUBCUTANEOUS
  Administered 2024-01-30: 3 [IU] via SUBCUTANEOUS
  Administered 2024-01-30 – 2024-01-31 (×3): 2 [IU] via SUBCUTANEOUS

## 2024-01-27 MED ORDER — TRAVASOL 10 % IV SOLN
INTRAVENOUS | Status: DC
  Filled 2024-01-27: qty 1100.16
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
## 2024-01-27 NOTE — Progress Notes (Signed)
 PROGRESS NOTE  Billy [name] FMW:[id number] DOB: 10-01-1950 DOA: 01/21/2024 PCP: Seabron Lenis, MD   LOS: 6 days   Brief Narrative / Interim history: Billy [name] is a 73 y.o. male with a history of HFrEF, AFib, CAD, PE complicated by cardiogenic shock who presented to the ED on 01/21/2024 with several days of vomiting followed by an episode of severe tearing epigastric pain. He was found to have an esophageal tear. GI and cardiothoracic surgery were consulted, took for EGD with stent placement 10/7.   Subjective / 24h Interval events: Complains of ongoing chest tightness, slightly worse when he is eating food.  He wants a protein shake.  He denies any shortness of breath.  He is somewhat frustrated about everything that is going on.  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 Shyrl 10/7.  Repeat swallow study did not show any leak, okay for diet - I discussed with Dr. Shyrl over the phone on 10/9, since he is tolerating liquids will advance to dysphagia/soft diet for which he will need to remain on for several weeks.  Antibiotics will be transition to liquid formulation, plan for total of 6 weeks. - Per cardiothoracic surgery, reattempt anticoagulation today   Active problems AKI on CKD3b -baseline creatinine in the low 2s, creatinine stable, 1.8 today   PAF, with RVR, history of DVT/PE -discussed with CT surgery, given ongoing intermittent bleeding, hold anticoagulation for now, will need to be reevaluated as an outpatient prior to resuming -Due to RVR now started on amiodarone  infusion, management per cardiology   Chronic HFrEF -appreciate heart failure follow-up, getting diuresed, further management per CHF team.  To be started on GDMT as able   Hyperkalemia -manage potassium again this morning  Scheduled Meds:   amoxicillin-clavulanate  875 mg Oral Q12H   bisacodyl   10 mg Rectal Q0600   Chlorhexidine  Gluconate Cloth  6 each Topical Daily   insulin  aspart  10 Units Intravenous Once   And   dextrose   1 ampule Intravenous Once   feeding supplement  237 mL Oral BID BM   fluconazole  200 mg Oral Daily   insulin  aspart  0-15 Units Subcutaneous Q4H   levalbuterol  0.63 mg Nebulization Q6H   methocarbamol   500 mg Oral TID   [START ON 01/28/2024] multivitamin with minerals  1 tablet Oral Daily   pantoprazole  (PROTONIX ) IV  40 mg Intravenous QHS   sacubitril -valsartan   1 tablet Oral BID   sodium chloride  flush  10-40 mL Intracatheter Q12H   spironolactone   12.5 mg Oral Daily   Continuous Infusions:  amiodarone  30 mg/hr (01/27/24 1035)   calcium  gluconate     famotidine (PEPCID) IV 20 mg (01/27/24 0905)   TPN ADULT (ION) 80 mL/hr at 01/26/24 1935   PRN Meds:.benzonatate, HYDROmorphone  (DILAUDID ) injection, methocarbamol  (ROBAXIN ) injection, ondansetron  (ZOFRAN ) IV, mouth rinse, oxyCODONE , simethicone , sodium chloride  flush  Current Outpatient Medications  Medication Instructions   apixaban  (ELIQUIS ) 5 mg, Oral, 2 times daily   atorvastatin  (LIPITOR ) 80 mg, Oral, Daily   ezetimibe  (ZETIA ) 10 mg, Oral, Daily   ibuprofen (ADVIL) 400-600 mg, Oral, 2 times daily PRN   Multiple Vitamins-Minerals (MENS 50+ MULTIVITAMIN) TABS 1 tablet, Oral, Daily   sacubitril -valsartan  (ENTRESTO ) 97-103 MG 1 tablet, Oral, 2 times daily   torsemide  (DEMADEX ) 20 mg, Oral, Daily   Diet Orders (From  admission, onward)     Start     Ordered   01/26/24 0741  DIET DYS 3 Room service appropriate? Yes; Fluid consistency: Thin  Diet effective now       Question Answer Comment  Room service appropriate? Yes   Fluid consistency: Thin      01/26/24 0741            DVT prophylaxis: SCDs Start: 01/21/24 2023   Lab Results  Component Value Date   PLT 229 01/27/2024      Code Status: Full Code  Family  Communication: No family at bedside  Status is: Inpatient Remains inpatient appropriate because: Severity of illness    Level of care: Progressive  Consultants:  Cardiothoracic surgery Cardiology  Objective: Vitals:   01/27/24 0415 01/27/24 0612 01/27/24 0805 01/27/24 0947  BP: 130/79  (!) 145/93   Pulse: 96  86 84  Resp: 19 (!) 24 18 (!) 21  Temp: 98.4 F (36.9 C)  98.4 F (36.9 C)   TempSrc:   Oral   SpO2: 97%  94% 97%  Weight:  76.9 kg    Height:        Intake/Output Summary (Last 24 hours) at 01/27/2024 1054 Last data filed at 01/27/2024 1035 Gross per 24 hour  Intake 1906.46 ml  Output 2200 ml  Net -293.54 ml   Wt Readings from Last 3 Encounters:  01/27/24 76.9 kg  12/19/23 75.7 kg  09/27/23 74.8 kg    Examination:  Constitutional: NAD Eyes: lids and conjunctivae normal, no scleral icterus ENMT: mmm Neck: normal, supple Respiratory: clear to auscultation bilaterally, no wheezing, no crackles.  Cardiovascular: Regular rate and rhythm, no murmurs / rubs / gallops. No LE edema. Abdomen: soft, no distention, no tenderness. Bowel sounds positive.   Data Reviewed: I have independently reviewed following labs and imaging studies   CBC Recent Labs  Lab 01/21/24 1439 01/21/24 1500 01/22/24 0215 01/24/24 0208 01/25/24 0307 01/26/24 0320 01/27/24 0435  WBC 9.5  --  6.2 8.7 11.7* 11.9* 14.2*  HGB 14.8   < > 13.5 10.8* 11.1* 10.1* 10.2*  HCT 47.9   < > 44.5 34.4* 35.8* 32.4* 31.9*  PLT 208  --  151 186 235 209 229  MCV 89.9  --  90.6 88.2 89.1 88.3 86.9  MCH 27.8  --  27.5 27.7 27.6 27.5 27.8  MCHC 30.9  --  30.3 31.4 31.0 31.2 32.0  RDW 18.0*  --  18.0* 18.6* 18.5* 18.5* 18.4*  LYMPHSABS 0.7  --   --   --   --   --   --   MONOABS 0.6  --   --   --   --   --   --   EOSABS 0.1  --   --   --   --   --   --   BASOSABS 0.0  --   --   --   --   --   --    < > = values in this interval not displayed.    Recent Labs  Lab 01/21/24 1551 01/21/24 1728  01/22/24 0816 01/23/24 0430 01/23/24 1041 01/23/24 1202 01/23/24 1931 01/24/24 0208 01/25/24 0307 01/25/24 0855 01/25/24 1053 01/26/24 0320 01/26/24 0831 01/27/24 0435  NA 143 139   < > 142  --   --   --  140 139  --   --   --  141 139  K 2.8* 3.8   < > 5.5*   < >  --    < >  3.9 3.3* 4.0 3.3* 3.3* 3.8 3.3*  CL 118* 104   < > 106  --   --   --  106 106  --   --   --  105 106  CO2 17* 23   < > 25  --   --   --  26 21*  --   --   --  23 23  GLUCOSE 165* 210*   < > 175*  --   --   --  171* 167*  --   --   --  137* 175*  BUN 37* 49*   < > 56*  --   --   --  57* 54*  --   --   --  56* 54*  CREATININE 1.43* 2.15*   < > 2.68*  --   --   --  2.42* 2.10*  --   --   --  2.07* 1.80*  CALCIUM  5.5* 8.2*   < > 9.2  --   --   --  8.9 8.6*  --   --   --  8.5* 8.4*  AST 21  --   --   --   --   --   --   --  18  --   --   --   --  34  ALT 21  --   --   --   --   --   --   --  17  --   --   --   --  30  ALKPHOS 33*  --   --   --   --   --   --   --  45  --   --   --   --  94  BILITOT 0.5  --   --   --   --   --   --   --  1.3*  --   --   --   --  0.8  ALBUMIN  2.0*  --   --   --   --   --   --   --  2.2*  --   --   --   --  1.8*  MG  --   --    < > 2.4  --   --   --  2.3 2.0  --   --  2.0  --  2.1  INR  --  1.3*  --   --   --   --   --   --   --   --   --   --   --   --   HGBA1C  --   --   --   --   --  6.0*  --   --   --   --   --   --   --   --    < > = values in this interval not displayed.    ------------------------------------------------------------------------------------------------------------------ No results for input(s): CHOL, HDL, LDLCALC, TRIG, CHOLHDL, LDLDIRECT in the last 72 hours.   Lab Results  Component Value Date   HGBA1C 6.0 (H) 01/23/2024   ------------------------------------------------------------------------------------------------------------------ No results for input(s): TSH, T4TOTAL, T3FREE, THYROIDAB in the last 72 hours.  Invalid input(s):  FREET3  Cardiac Enzymes No results for input(s): CKMB, TROPONINI, MYOGLOBIN in the last 168 hours.  Invalid input(s): CK ------------------------------------------------------------------------------------------------------------------    Component Value Date/Time   BNP 646.4 (H) 06/27/2023 1116  CBG: Recent Labs  Lab 01/26/24 1553 01/26/24 2002 01/26/24 2327 01/27/24 0418 01/27/24 0801  GLUCAP 159* 141* 161* 177* 209*    Recent Results (from the past 240 hours)  MRSA Next Gen by PCR, Nasal     Status: Abnormal   Collection Time: 01/21/24  9:27 PM   Specimen: Nasal Mucosa; Nasal Swab  Result Value Ref Range Status   MRSA by PCR Next Gen DETECTED (A) NOT DETECTED Final    Comment: RESULT CALLED TO, READ BACK BY AND VERIFIED WITH: J MCBRIDE RN 01/21/2024 @ 2334  (NOTE) The GeneXpert MRSA Assay (FDA approved for NASAL specimens only), is one component of a comprehensive MRSA colonization surveillance program. It is not intended to diagnose MRSA infection nor to guide or monitor treatment for MRSA infections. Test performance is not FDA approved in patients less than 12 years old. Performed at [facility], [address], [place], [place] [address]      Radiology Studies: No results found.   Nilda Fendt, MD, PhD [facility]  Between 7 am - 7 pm I am available, please contact me via Amion (for emergencies) or Securechat (non urgent messages)  Between 7 pm - 7 am I am not available, please contact night coverage MD/APP via Amion

## 2024-01-27 NOTE — Progress Notes (Signed)
 Progress Note  Patient Name: Billy [name] Date of Encounter: 01/27/2024  Primary Cardiologist:   Dorn Lesches, MD   Subjective   He feels like he is wheezing.  No pain.    Inpatient Medications    Scheduled Meds:  amoxicillin-clavulanate  875 mg Oral Q12H   bisacodyl   10 mg Rectal Q0600   Chlorhexidine  Gluconate Cloth  6 each Topical Daily   insulin  aspart  10 Units Intravenous Once   And   dextrose   1 ampule Intravenous Once   feeding supplement  237 mL Oral BID BM   fluconazole  200 mg Oral Daily   insulin  aspart  0-15 Units Subcutaneous Q4H   methocarbamol   500 mg Oral TID   [START ON 01/28/2024] multivitamin with minerals  1 tablet Oral Daily   pantoprazole  (PROTONIX ) IV  40 mg Intravenous QHS   sacubitril -valsartan   1 tablet Oral BID   sodium chloride  flush  10-40 mL Intracatheter Q12H   spironolactone   12.5 mg Oral Daily   Continuous Infusions:  amiodarone  30 mg/hr (01/26/24 2333)   calcium  gluconate     famotidine (PEPCID) IV 20 mg (01/27/24 0905)   TPN ADULT (ION) 80 mL/hr at 01/26/24 1935   PRN Meds: benzonatate, HYDROmorphone  (DILAUDID ) injection, levalbuterol, methocarbamol  (ROBAXIN ) injection, ondansetron  (ZOFRAN ) IV, mouth rinse, oxyCODONE , simethicone , sodium chloride  flush   Vital Signs    Vitals:   01/26/24 2321 01/27/24 0415 01/27/24 0612 01/27/24 0805  BP: 139/77 130/79  (!) 145/93  Pulse: 83 96  86  Resp: 20 19 (!) 24 18  Temp: 98.4 F (36.9 C) 98.4 F (36.9 C)  98.4 F (36.9 C)  TempSrc: Oral   Oral  SpO2: 98% 97%  94%  Weight:   76.9 kg   Height:        Intake/Output Summary (Last 24 hours) at 01/27/2024 0911 Last data filed at 01/27/2024 0700 Gross per 24 hour  Intake 1720.61 ml  Output 2200 ml  Net -479.39 ml   Filed Weights   01/25/24 0500 01/26/24 0433 01/27/24 0612  Weight: 78.9 kg 76.3 kg 76.9 kg    Telemetry    Atrial fib with controlled ventricular rate - Personally Reviewed  ECG    NA - Personally  Reviewed  Physical Exam   GEN: No acute distress.   Neck: No  JVD Cardiac: Irregular RR, 2/6 apical systolic murmur, no diastolic murmurs, rubs, or gallops.  Respiratory:     Decreased breath sounds with diffuse wheezing.  GI: Soft, nontender, non-distended  MS: No  edema; No deformity. Neuro:  Nonfocal  Psych: Normal affect   Labs    Chemistry Recent Labs  Lab 01/21/24 1551 01/21/24 1728 01/25/24 0307 01/25/24 0855 01/26/24 0320 01/26/24 0831 01/27/24 0435  NA 143   < > 139  --   --  141 139  K 2.8*   < > 3.3*   < > 3.3* 3.8 3.3*  CL 118*   < > 106  --   --  105 106  CO2 17*   < > 21*  --   --  23 23  GLUCOSE 165*   < > 167*  --   --  137* 175*  BUN 37*   < > 54*  --   --  56* 54*  CREATININE 1.43*   < > 2.10*  --   --  2.07* 1.80*  CALCIUM  5.5*   < > 8.6*  --   --  8.5* 8.4*  PROT 3.8*  --  5.8*  --   --   --  5.3*  ALBUMIN  2.0*  --  2.2*  --   --   --  1.8*  AST 21  --  18  --   --   --  34  ALT 21  --  17  --   --   --  30  ALKPHOS 33*  --  45  --   --   --  94  BILITOT 0.5  --  1.3*  --   --   --  0.8  GFRNONAA 52*   < > 33*  --   --  34* 40*  ANIONGAP 8   < > 12  --   --  13 10   < > = values in this interval not displayed.     Hematology Recent Labs  Lab 01/25/24 0307 01/26/24 0320 01/27/24 0435  WBC 11.7* 11.9* 14.2*  RBC 4.02* 3.67* 3.67*  HGB 11.1* 10.1* 10.2*  HCT 35.8* 32.4* 31.9*  MCV 89.1 88.3 86.9  MCH 27.6 27.5 27.8  MCHC 31.0 31.2 32.0  RDW 18.5* 18.5* 18.4*  PLT 235 209 229    Cardiac EnzymesNo results for input(s): TROPONINI in the last 168 hours. No results for input(s): TROPIPOC in the last 168 hours.   BNPNo results for input(s): BNP, PROBNP in the last 168 hours.   DDimer No results for input(s): DDIMER in the last 168 hours.   Radiology    No results found.  Cardiac Studies   NA  Patient Profile     73 y.o. male Patient with longstanding history of chronic heart failure with preserved ejection fraction, RV  failure with history of pulmonary embolism presents with esophageal perforation.  S/P Stent Esophageal Stent   Assessment & Plan    Esophageal Perforation:   Status post esophageal stent.     Chronic HFpEF: Given IV Lasix  yesterday.  Added low-dose Entresto .  Added spironolactone .  GDMT limited by renal function.  No Co Ox   this AM.   Start PO diuretic.  Given last dose of IV yesterday.     CAD:     Continue medical management.    CKD Stage 3b:   Creat is improved.   Continue to follow.      PAF:    OK to start heparin  per Dr. Shyrl.       H/o DVT/PE:   OK to start heparin  per Dr. Shyrl.   Now on heparin .  DOAC when able.    Carotid stenosis:   US  9/25: widely patent carotid stent on R and no stenosis on left.   For questions or updates, please contact [facility] Please consult [email or web address] for contact info under Cardiology/STEMI.   Signed, Lynwood Schilling, MD  01/27/2024, 9:11 AM

## 2024-01-27 NOTE — Plan of Care (Signed)

## 2024-01-28 DIAGNOSIS — K223 Perforation of esophagus: Secondary | ICD-10-CM | POA: Diagnosis not present

## 2024-01-28 DIAGNOSIS — I480 Paroxysmal atrial fibrillation: Secondary | ICD-10-CM | POA: Diagnosis not present

## 2024-01-28 DIAGNOSIS — I5032 Chronic diastolic (congestive) heart failure: Secondary | ICD-10-CM | POA: Diagnosis not present

## 2024-01-28 DIAGNOSIS — I2510 Atherosclerotic heart disease of native coronary artery without angina pectoris: Secondary | ICD-10-CM | POA: Diagnosis not present

## 2024-01-28 LAB — GLUCOSE, CAPILLARY
Glucose-Capillary: 116 mg/dL — ABNORMAL HIGH (ref 70–99)
Glucose-Capillary: 130 mg/dL — ABNORMAL HIGH (ref 70–99)
Glucose-Capillary: 138 mg/dL — ABNORMAL HIGH (ref 70–99)
Glucose-Capillary: 152 mg/dL — ABNORMAL HIGH (ref 70–99)
Glucose-Capillary: 158 mg/dL — ABNORMAL HIGH (ref 70–99)

## 2024-01-28 LAB — BASIC METABOLIC PANEL WITH GFR
Anion gap: 12 (ref 5–15)
BUN: 55 mg/dL — ABNORMAL HIGH (ref 8–23)
CO2: 23 mmol/L (ref 22–32)
Calcium: 8.3 mg/dL — ABNORMAL LOW (ref 8.9–10.3)
Chloride: 106 mmol/L (ref 98–111)
Creatinine, Ser: 1.77 mg/dL — ABNORMAL HIGH (ref 0.61–1.24)
GFR, Estimated: 41 mL/min — ABNORMAL LOW (ref >60)
Glucose, Bld: 155 mg/dL — ABNORMAL HIGH (ref 70–99)
Potassium: 3.5 mmol/L (ref 3.5–5.1)
Sodium: 141 mmol/L (ref 135–145)

## 2024-01-28 LAB — APTT: aPTT: 37 s — ABNORMAL HIGH (ref 24–36)

## 2024-01-28 LAB — HEPARIN LEVEL (UNFRACTIONATED): Heparin Unfractionated: 0.38 [IU]/mL (ref 0.30–0.70)

## 2024-01-28 LAB — CBC
HCT: 30.6 % — ABNORMAL LOW (ref 39.0–52.0)
Hemoglobin: 9.7 g/dL — ABNORMAL LOW (ref 13.0–17.0)
MCH: 27.6 pg (ref 26.0–34.0)
MCHC: 31.7 g/dL (ref 30.0–36.0)
MCV: 87.2 fL (ref 80.0–100.0)
Platelets: 259 K/uL (ref 150–400)
RBC: 3.51 MIL/uL — ABNORMAL LOW (ref 4.22–5.81)
RDW: 18.7 % — ABNORMAL HIGH (ref 11.5–15.5)
WBC: 14.9 K/uL — ABNORMAL HIGH (ref 4.0–10.5)
nRBC: 0.1 % (ref 0.0–0.2)

## 2024-01-28 MED ORDER — TRAVASOL 10 % IV SOLN
INTRAVENOUS | Status: AC
  Filled 2024-01-28: qty 1100.2

## 2024-01-28 MED ORDER — AMIODARONE HCL 200 MG PO TABS
400.0000 mg | ORAL_TABLET | Freq: Two times a day (BID) | ORAL | Status: DC
  Administered 2024-01-28 – 2024-01-30 (×6): 400 mg via ORAL
  Filled 2024-01-28 (×7): qty 2

## 2024-01-28 MED ORDER — POTASSIUM CHLORIDE 20 MEQ PO PACK
40.0000 meq | PACK | Freq: Once | ORAL | Status: AC
  Administered 2024-01-28: 40 meq via ORAL
  Filled 2024-01-28: qty 2

## 2024-01-28 NOTE — Progress Notes (Signed)
 PHARMACY - TOTAL PARENTERAL NUTRITION CONSULT NOTE   Indication: intolerance to enteral feedings, esophageal performation  Patient Measurements: Height: 6' 1 (185.4 cm) Weight: 77.8 kg (171 lb 8.3 oz) IBW/kg (Calculated) : 79.9 TPN AdjBW (KG): 72.8 Body mass index is 22.63 kg/m.  Assessment:  73 year old man with medical history significant for cardiac arrest earlier this year who was admitted after several days of nausea and vomiting from a stomach bug followed by sudden onset severe tearing epigastric pain radiating to back and chest. 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 Pharmacy consulted to initiate TPN due to inability to tolerate enteral nutrition at this time.  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 PTA takes torsemide  20 mg PO daily, now held. Aggressive fluid resuscitation recommended per GI. Per HF, patient currently euvolemic but will hold diuretic for now. Due to CHF will target lower end of fluid range for TPN and supplement additional fluids per MD outside of TPN to prevent fluid overload.   Stent placed on 10/6, esophogram not showing any leak. Diet advanced to CLD.   Glucose / Insulin : A1C (02/2023) 6.0; CBGs 130-150s, mSSI (16u/24h)  Electrolytes: K 3.5 ( kcl outside of TPN), Na 141, Mg 2.1, Phos 3.1, CoCa 10.0, others wnl, HCO3: 23, Cl: 106  Renal: Scr downtrending 1.77 (bsl ~2), BUN 55 Hepatic: AST/ALT wnl, tbili 0.5, alb 2.2 Intake / Output; MIVF: UOP charted 0.3 mL/kg/hr, LBM 10/11, bisacodyl , net + 6.9L   GI Imaging: 10/06 Esophagogram/barium study: distal esophageal perforation 10/05 CT: concerning for esophageal perforation, masslike area of soft tissue thickening adjacent to and surrounding the esophagus - differential considerations remain esophageal mass, hiatal hernia, or infection GI Surgeries / Procedures:  10/6: esophageal stent placement   Central access: 01/22/24 TPN start date:  01/22/24  Nutritional Goals: Goal TPN rate is 80 mL/hr (provides ~110 g of protein and 2101 kcals per day)  RD Assessment: pending Estimated Needs Total Energy Estimated Needs: 2100-2300 kcals Total Protein Estimated Needs: 110-130 g Total Fluid Estimated Needs: 2L  Current Nutrition:  NPO 10/5 >>10/8 CLD 10/8 >>  TPN 10/6 >>  Plan:  Continue TPN to goal 80 mL/hr at 1800 to meet 100% total nutritional needs. Monitor volume status, low threshold to concentrate TPN given history of HF. Currently minimal free water  in TPN.  Electrolytes in TPN: Na 25 mEq/L, increase to K 20 mEq/L (28 mEq total), Ca 3 mEq/L, Mg 3 mEq/L, Phos 10 mmol/L. Cl:Ac 1:2 to max acetate Add standard MVI and trace elements to TPN Change to Moderate q6h SSI and adjust as needed Add 10 units insulin  to TPN  Add thiamine 100 mg IV x5 days per RD recommendation, EOT of 10/11 Monitor TPN labs on Mon/Thurs, daily while initiating TPN  Thank you for allowing pharmacy to be a part of this patient's care.  Shelba Collier, PharmD, BCPS Clinical Pharmacist

## 2024-01-28 NOTE — Plan of Care (Signed)
   Problem: Education: Goal: Knowledge of General Education information will improve Description: Including pain rating scale, medication(s)/side effects and non-pharmacologic comfort measures Outcome: Progressing   Problem: Health Behavior/Discharge Planning: Goal: Ability to manage health-related needs will improve Outcome: Progressing   Problem: Activity: Goal: Risk for activity intolerance will decrease Outcome: Progressing

## 2024-01-28 NOTE — Progress Notes (Addendum)
 PROGRESS NOTE  Billy [name] FMW:[id number] DOB: 03-14-1951 DOA: 01/21/2024 PCP: Seabron Lenis, MD   LOS: 7 days   Brief Narrative / Interim history: Billy [name] is a 73 y.o. male with a history of HFrEF, AFib, CAD, PE complicated by cardiogenic shock who presented to the ED on 01/21/2024 with several days of vomiting followed by an episode of severe tearing epigastric pain. He was found to have an esophageal tear. GI and cardiothoracic surgery were consulted, took for EGD with stent placement 10/7.   Subjective / 24h Interval events: Feels OK this morning.  Some chest discomfort with eating, has not coughed up any significant amounts of blood.  Denies any palpitations.  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 Shyrl 10/7.  Repeat swallow study did not show any leak, okay for diet - I discussed with Dr. Shyrl over the phone on 10/9, since he is tolerating liquids will advance to dysphagia/soft diet for which he will need to remain on for several weeks.  Antibiotics will be transition to liquid formulation, plan for total of 6 weeks. - Per cardiothoracic surgery, he was placed back on heparin  10/11.  If he tolerates this well today, repeat hemoglobin tomorrow morning, may consider transitioning to oral agents - Calorie count underway, will need TPN to be weaned off  Active problems AKI on CKD3b -baseline creatinine in the low 2s, creatinine stable, 1.7 today  PAF, with RVR, history of DVT/PE -discussed with CT surgery, back on heparin  -Due to RVR now started on amiodarone  infusion, transition to oral  Chronic HFrEF -appreciate heart failure follow-up, getting diuresed, further management per CHF team.  Currently on oral furosemide , Entresto , spironolactone  along with amiodarone  as above   Hyperkalemia -replenish potassium again today  Scheduled  Meds:  amiodarone   400 mg Oral BID   amoxicillin-clavulanate  875 mg Oral Q12H   bisacodyl   10 mg Rectal Q0600   Chlorhexidine  Gluconate Cloth  6 each Topical Daily   insulin  aspart  10 Units Intravenous Once   And   dextrose   1 ampule Intravenous Once   feeding supplement  237 mL Oral BID BM   fluconazole  200 mg Oral Daily   furosemide   40 mg Oral Daily   insulin  aspart  0-15 Units Subcutaneous Q6H   levalbuterol  0.63 mg Nebulization Q6H   methocarbamol   500 mg Oral TID   multivitamin with minerals  1 tablet Oral Daily   pantoprazole  (PROTONIX ) IV  40 mg Intravenous QHS   sacubitril -valsartan   1 tablet Oral BID   sodium chloride  flush  10-40 mL Intracatheter Q12H   spironolactone   12.5 mg Oral Daily   Continuous Infusions:  calcium  gluconate     famotidine (PEPCID) IV 20 mg (01/27/24 2155)   heparin  750 Units/hr (01/28/24 0955)   TPN ADULT (ION) 80 mL/hr at 01/28/24 0309   PRN Meds:.benzonatate, HYDROmorphone  (DILAUDID ) injection, methocarbamol  (ROBAXIN ) injection, ondansetron  (ZOFRAN ) IV, mouth rinse, oxyCODONE , simethicone , sodium chloride  flush  Current Outpatient Medications  Medication Instructions   apixaban  (ELIQUIS ) 5 mg, Oral, 2 times daily   atorvastatin  (LIPITOR ) 80 mg, Oral, Daily   ezetimibe  (ZETIA ) 10 mg, Oral, Daily   ibuprofen (ADVIL) 400-600 mg, Oral, 2 times daily PRN   Multiple Vitamins-Minerals (MENS 50+ MULTIVITAMIN) TABS 1 tablet, Oral, Daily   sacubitril -valsartan  (ENTRESTO ) 97-103 MG 1 tablet, Oral, 2 times daily   torsemide  (DEMADEX ) 20  mg, Oral, Daily   Diet Orders (From admission, onward)     Start     Ordered   01/26/24 0741  DIET DYS 3 Room service appropriate? Yes; Fluid consistency: Thin  Diet effective now       Question Answer Comment  Room service appropriate? Yes   Fluid consistency: Thin      01/26/24 0741            DVT prophylaxis: SCDs Start: 01/21/24 2023   Lab Results  Component Value Date   PLT 259 01/28/2024       Code Status: Full Code  Family Communication: No family at bedside  Status is: Inpatient Remains inpatient appropriate because: Severity of illness    Level of care: Progressive  Consultants:  Cardiothoracic surgery Cardiology  Objective: Vitals:   01/28/24 0309 01/28/24 0616 01/28/24 0737 01/28/24 0752  BP: (!) 121/59   (!) 145/56  Pulse: 87  81 82  Resp: (!) 21 17 (!) 22 16  Temp: 98.5 F (36.9 C)   98.6 F (37 C)  TempSrc: Oral   Oral  SpO2: 95%  97% 90%  Weight:  77.8 kg    Height:        Intake/Output Summary (Last 24 hours) at 01/28/2024 0956 Last data filed at 01/28/2024 0900 Gross per 24 hour  Intake 3329.18 ml  Output 1350 ml  Net 1979.18 ml   Wt Readings from Last 3 Encounters:  01/28/24 77.8 kg  12/19/23 75.7 kg  09/27/23 74.8 kg    Examination:  Constitutional: NAD Eyes: lids and conjunctivae normal, no scleral icterus ENMT: mmm Neck: normal, supple Respiratory: clear to auscultation bilaterally, no wheezing, no crackles.  Cardiovascular: Regular rate and rhythm, no murmurs / rubs / gallops. No LE edema. Abdomen: soft, no distention, no tenderness. Bowel sounds positive.   Data Reviewed: I have independently reviewed following labs and imaging studies   CBC Recent Labs  Lab 01/21/24 1439 01/21/24 1500 01/24/24 0208 01/25/24 0307 01/26/24 0320 01/27/24 0435 01/28/24 0600  WBC 9.5   < > 8.7 11.7* 11.9* 14.2* 14.9*  HGB 14.8   < > 10.8* 11.1* 10.1* 10.2* 9.7*  HCT 47.9   < > 34.4* 35.8* 32.4* 31.9* 30.6*  PLT 208   < > 186 235 209 229 259  MCV 89.9   < > 88.2 89.1 88.3 86.9 87.2  MCH 27.8   < > 27.7 27.6 27.5 27.8 27.6  MCHC 30.9   < > 31.4 31.0 31.2 32.0 31.7  RDW 18.0*   < > 18.6* 18.5* 18.5* 18.4* 18.7*  LYMPHSABS 0.7  --   --   --   --   --   --   MONOABS 0.6  --   --   --   --   --   --   EOSABS 0.1  --   --   --   --   --   --   BASOSABS 0.0  --   --   --   --   --   --    < > = values in this interval not displayed.     Recent Labs  Lab 01/21/24 1551 01/21/24 1728 01/22/24 9183 01/23/24 0430 01/23/24 1041 01/23/24 1202 01/23/24 1931 01/24/24 9791 01/25/24 9692 01/25/24 0855 01/25/24 1053 01/26/24 0320 01/26/24 0831 01/27/24 0435 01/28/24 0600  NA 143 139   < > 142  --   --   --  140 139  --   --   --  141 139 141  K 2.8* 3.8   < > 5.5*   < >  --    < > 3.9 3.3*   < > 3.3* 3.3* 3.8 3.3* 3.5  CL 118* 104   < > 106  --   --   --  106 106  --   --   --  105 106 106  CO2 17* 23   < > 25  --   --   --  26 21*  --   --   --  23 23 23   GLUCOSE 165* 210*   < > 175*  --   --   --  171* 167*  --   --   --  137* 175* 155*  BUN 37* 49*   < > 56*  --   --   --  57* 54*  --   --   --  56* 54* 55*  CREATININE 1.43* 2.15*   < > 2.68*  --   --   --  2.42* 2.10*  --   --   --  2.07* 1.80* 1.77*  CALCIUM  5.5* 8.2*   < > 9.2  --   --   --  8.9 8.6*  --   --   --  8.5* 8.4* 8.3*  AST 21  --   --   --   --   --   --   --  18  --   --   --   --  34  --   ALT 21  --   --   --   --   --   --   --  17  --   --   --   --  30  --   ALKPHOS 33*  --   --   --   --   --   --   --  45  --   --   --   --  94  --   BILITOT 0.5  --   --   --   --   --   --   --  1.3*  --   --   --   --  0.8  --   ALBUMIN  2.0*  --   --   --   --   --   --   --  2.2*  --   --   --   --  1.8*  --   MG  --   --    < > 2.4  --   --   --  2.3 2.0  --   --  2.0  --  2.1  --   INR  --  1.3*  --   --   --   --   --   --   --   --   --   --   --   --   --   HGBA1C  --   --   --   --   --  6.0*  --   --   --   --   --   --   --   --   --    < > = values in this interval not displayed.    ------------------------------------------------------------------------------------------------------------------ No results for input(s): CHOL, HDL, LDLCALC, TRIG, CHOLHDL, LDLDIRECT in the last 72 hours.   Lab Results  Component Value Date   HGBA1C  6.0 (H) 01/23/2024    ------------------------------------------------------------------------------------------------------------------ No results for input(s): TSH, T4TOTAL, T3FREE, THYROIDAB in the last 72 hours.  Invalid input(s): FREET3  Cardiac Enzymes No results for input(s): CKMB, TROPONINI, MYOGLOBIN in the last 168 hours.  Invalid input(s): CK ------------------------------------------------------------------------------------------------------------------    Component Value Date/Time   BNP 646.4 (H) 06/27/2023 1116    CBG: Recent Labs  Lab 01/27/24 1145 01/27/24 1706 01/27/24 2019 01/28/24 0004 01/28/24 0615  GLUCAP 136* 171* 146* 152* 138*    Recent Results (from the past 240 hours)  MRSA Next Gen by PCR, Nasal     Status: Abnormal   Collection Time: 01/21/24  9:27 PM   Specimen: Nasal Mucosa; Nasal Swab  Result Value Ref Range Status   MRSA by PCR Next Gen DETECTED (A) NOT DETECTED Final    Comment: RESULT CALLED TO, READ BACK BY AND VERIFIED WITH: J MCBRIDE RN 01/21/2024 @ 2334  (NOTE) The GeneXpert MRSA Assay (FDA approved for NASAL specimens only), is one component of a comprehensive MRSA colonization surveillance program. It is not intended to diagnose MRSA infection nor to guide or monitor treatment for MRSA infections. Test performance is not FDA approved in patients less than 88 years old. Performed at [facility], [address] [place], [place] [address]      Radiology Studies: No results found.   Nilda Fendt, MD, PhD [facility]  Between 7 am - 7 pm I am available, please contact me via Amion (for emergencies) or Securechat (non urgent messages)  Between 7 pm - 7 am I am not available, please contact night coverage MD/APP via Amion
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
## 2024-01-28 NOTE — Progress Notes (Signed)
 Progress Note  Patient Name: Billy [name] Date of Encounter: 01/28/2024  Primary Cardiologist:   Dorn Lesches, MD   Subjective   Breathing better.  No acute pain.   Inpatient Medications    Scheduled Meds:  amoxicillin-clavulanate  875 mg Oral Q12H   bisacodyl   10 mg Rectal Q0600   Chlorhexidine  Gluconate Cloth  6 each Topical Daily   insulin  aspart  10 Units Intravenous Once   And   dextrose   1 ampule Intravenous Once   feeding supplement  237 mL Oral BID BM   fluconazole  200 mg Oral Daily   furosemide   40 mg Oral Daily   insulin  aspart  0-15 Units Subcutaneous Q6H   levalbuterol  0.63 mg Nebulization Q6H   methocarbamol   500 mg Oral TID   multivitamin with minerals  1 tablet Oral Daily   pantoprazole  (PROTONIX ) IV  40 mg Intravenous QHS   sacubitril -valsartan   1 tablet Oral BID   sodium chloride  flush  10-40 mL Intracatheter Q12H   spironolactone   12.5 mg Oral Daily   Continuous Infusions:  amiodarone  30 mg/hr (01/28/24 0309)   calcium  gluconate     famotidine (PEPCID) IV 20 mg (01/27/24 2155)   heparin  600 Units/hr (01/28/24 0309)   TPN ADULT (ION) 80 mL/hr at 01/28/24 0309   PRN Meds: benzonatate, HYDROmorphone  (DILAUDID ) injection, methocarbamol  (ROBAXIN ) injection, ondansetron  (ZOFRAN ) IV, mouth rinse, oxyCODONE , simethicone , sodium chloride  flush   Vital Signs    Vitals:   01/27/24 2355 01/28/24 0309 01/28/24 0616 01/28/24 0737  BP: (!) 140/62 (!) 121/59    Pulse: 72 87  81  Resp: 19 (!) 21 17 (!) 22  Temp: 98.4 F (36.9 C) 98.5 F (36.9 C)    TempSrc: Oral Oral    SpO2: 100% 95%  97%  Weight:   77.8 kg   Height:        Intake/Output Summary (Last 24 hours) at 01/28/2024 0745 Last data filed at 01/28/2024 0309 Gross per 24 hour  Intake 3209.18 ml  Output 650 ml  Net 2559.18 ml   Filed Weights   01/26/24 0433 01/27/24 0612 01/28/24 0616  Weight: 76.3 kg 76.9 kg 77.8 kg    Telemetry    Atrial fib with controlled rate - Personally  Reviewed  ECG    NA - Personally Reviewed  Physical Exam   GEN: No  acute distress.   Neck: No  JVD Cardiac: Irregular RR,  murmurs, rubs, or gallops.  Respiratory:     Decreased breath sounds with diffuse scattered crackles GI: Soft, nontender, non-distended, normal bowel sounds  MS:  No edema; No deformity. Neuro:   Nonfocal  Psych: Oriented and appropriate    Labs    Chemistry Recent Labs  Lab 01/21/24 1551 01/21/24 1728 01/25/24 0307 01/25/24 0855 01/26/24 0831 01/27/24 0435 01/28/24 0600  NA 143   < > 139  --  141 139 141  K 2.8*   < > 3.3*   < > 3.8 3.3* 3.5  CL 118*   < > 106  --  105 106 106  CO2 17*   < > 21*  --  23 23 23   GLUCOSE 165*   < > 167*  --  137* 175* 155*  BUN 37*   < > 54*  --  56* 54* 55*  CREATININE 1.43*   < > 2.10*  --  2.07* 1.80* 1.77*  CALCIUM  5.5*   < > 8.6*  --  8.5* 8.4* 8.3*  PROT 3.8*  --  5.8*  --   --  5.3*  --   ALBUMIN  2.0*  --  2.2*  --   --  1.8*  --   AST 21  --  18  --   --  34  --   ALT 21  --  17  --   --  30  --   ALKPHOS 33*  --  45  --   --  94  --   BILITOT 0.5  --  1.3*  --   --  0.8  --   GFRNONAA 52*   < > 33*  --  34* 40* 41*  ANIONGAP 8   < > 12  --  13 10 12    < > = values in this interval not displayed.     Hematology Recent Labs  Lab 01/26/24 0320 01/27/24 0435 01/28/24 0600  WBC 11.9* 14.2* 14.9*  RBC 3.67* 3.67* 3.51*  HGB 10.1* 10.2* 9.7*  HCT 32.4* 31.9* 30.6*  MCV 88.3 86.9 87.2  MCH 27.5 27.8 27.6  MCHC 31.2 32.0 31.7  RDW 18.5* 18.4* 18.7*  PLT 209 229 259    Cardiac EnzymesNo results for input(s): TROPONINI in the last 168 hours. No results for input(s): TROPIPOC in the last 168 hours.   BNPNo results for input(s): BNP, PROBNP in the last 168 hours.   DDimer No results for input(s): DDIMER in the last 168 hours.   Radiology    No results found.  Cardiac Studies   NA  Patient Profile     73 y.o. male Patient with longstanding history of chronic heart failure with  preserved ejection fraction, RV failure with history of pulmonary embolism presents with esophageal perforation.  S/P Stent Esophageal Stent   Assessment & Plan    Esophageal Perforation:   Status post esophageal stent.   Restarted anticoagulation and tolerating.  Continue antibiotics and soft diet.    Chronic HFpEF: Given IV Lasix  yesterday.  Added low-dose Entresto .  Added spironolactone .  GDMT limited by renal function.   Started PO diuretic.  EF 60 -65%.  Seems to be euvolemic.     CAD:     Continue medical management.    CKD Stage 3b:   Creat is improved again today.  Tolerating med titration.     PAF:    On heparin .  DOAC when able.  Will change to PO amio.    H/o DVT/PE:   As above.    Carotid stenosis:   US  9/25: widely patent carotid stent on R and no stenosis on left.   For questions or updates, please contact [facility] Please consult [email or web address] for contact info under Cardiology/STEMI.   Signed, Lynwood Schilling, MD  01/28/2024, 7:45 AM

## 2024-01-29 DIAGNOSIS — I5032 Chronic diastolic (congestive) heart failure: Secondary | ICD-10-CM | POA: Diagnosis not present

## 2024-01-29 DIAGNOSIS — K223 Perforation of esophagus: Secondary | ICD-10-CM | POA: Diagnosis not present

## 2024-01-29 LAB — CBC WITH DIFFERENTIAL/PLATELET
Abs Immature Granulocytes: 0.84 K/uL — ABNORMAL HIGH (ref 0.00–0.07)
Basophils Absolute: 0.1 K/uL (ref 0.0–0.1)
Basophils Relative: 0 %
Eosinophils Absolute: 0.2 K/uL (ref 0.0–0.5)
Eosinophils Relative: 1 %
HCT: 34.2 % — ABNORMAL LOW (ref 39.0–52.0)
Hemoglobin: 10.6 g/dL — ABNORMAL LOW (ref 13.0–17.0)
Immature Granulocytes: 4 %
Lymphocytes Relative: 5 %
Lymphs Abs: 1 K/uL (ref 0.7–4.0)
MCH: 27.2 pg (ref 26.0–34.0)
MCHC: 31 g/dL (ref 30.0–36.0)
MCV: 87.7 fL (ref 80.0–100.0)
Monocytes Absolute: 1.4 K/uL — ABNORMAL HIGH (ref 0.1–1.0)
Monocytes Relative: 7 %
Neutro Abs: 15.7 K/uL — ABNORMAL HIGH (ref 1.7–7.7)
Neutrophils Relative %: 83 %
Platelets: 329 K/uL (ref 150–400)
RBC: 3.9 MIL/uL — ABNORMAL LOW (ref 4.22–5.81)
RDW: 18.3 % — ABNORMAL HIGH (ref 11.5–15.5)
WBC: 19.2 K/uL — ABNORMAL HIGH (ref 4.0–10.5)
nRBC: 0 % (ref 0.0–0.2)

## 2024-01-29 LAB — APTT
aPTT: 41 s — ABNORMAL HIGH (ref 24–36)
aPTT: 41 s — ABNORMAL HIGH (ref 24–36)
aPTT: 42 s — ABNORMAL HIGH (ref 24–36)

## 2024-01-29 LAB — COMPREHENSIVE METABOLIC PANEL WITH GFR
ALT: 39 U/L (ref 0–44)
AST: 32 U/L (ref 15–41)
Albumin: 1.9 g/dL — ABNORMAL LOW (ref 3.5–5.0)
Alkaline Phosphatase: 127 U/L — ABNORMAL HIGH (ref 38–126)
Anion gap: 12 (ref 5–15)
BUN: 62 mg/dL — ABNORMAL HIGH (ref 8–23)
CO2: 18 mmol/L — ABNORMAL LOW (ref 22–32)
Calcium: 8.6 mg/dL — ABNORMAL LOW (ref 8.9–10.3)
Chloride: 108 mmol/L (ref 98–111)
Creatinine, Ser: 1.88 mg/dL — ABNORMAL HIGH (ref 0.61–1.24)
GFR, Estimated: 38 mL/min — ABNORMAL LOW (ref >60)
Glucose, Bld: 147 mg/dL — ABNORMAL HIGH (ref 70–99)
Potassium: 4.4 mmol/L (ref 3.5–5.1)
Sodium: 138 mmol/L (ref 135–145)
Total Bilirubin: 1.1 mg/dL (ref 0.0–1.2)
Total Protein: 5.8 g/dL — ABNORMAL LOW (ref 6.5–8.1)

## 2024-01-29 LAB — PHOSPHORUS: Phosphorus: 4 mg/dL (ref 2.5–4.6)

## 2024-01-29 LAB — MAGNESIUM: Magnesium: 2.1 mg/dL (ref 1.7–2.4)

## 2024-01-29 LAB — TRIGLYCERIDES: Triglycerides: 65 mg/dL (ref <150)

## 2024-01-29 LAB — HEPARIN LEVEL (UNFRACTIONATED): Heparin Unfractionated: 0.33 [IU]/mL (ref 0.30–0.70)

## 2024-01-29 LAB — GLUCOSE, CAPILLARY
Glucose-Capillary: 126 mg/dL — ABNORMAL HIGH (ref 70–99)
Glucose-Capillary: 158 mg/dL — ABNORMAL HIGH (ref 70–99)
Glucose-Capillary: 127 mg/dL — ABNORMAL HIGH (ref 70–99)

## 2024-01-29 MED ORDER — TORSEMIDE 20 MG PO TABS: 20.0000 mg | ORAL_TABLET | Freq: Every day | ORAL | Status: DC

## 2024-01-29 MED ORDER — FUROSEMIDE 10 MG/ML IJ SOLN
80.0000 mg | Freq: Once | INTRAMUSCULAR | Status: AC
  Administered 2024-01-29: 80 mg via INTRAVENOUS
  Filled 2024-01-29: qty 8

## 2024-01-29 MED ORDER — GUAIFENESIN-DM 100-10 MG/5ML PO SYRP
5.0000 mL | ORAL_SOLUTION | ORAL | Status: DC | PRN
  Administered 2024-01-29: 5 mL via ORAL
  Filled 2024-01-29: qty 5

## 2024-01-29 MED ORDER — SPIRONOLACTONE 12.5 MG HALF TABLET
12.5000 mg | ORAL_TABLET | Freq: Once | ORAL | Status: AC
  Administered 2024-01-29: 12.5 mg via ORAL
  Filled 2024-01-29: qty 1

## 2024-01-29 MED ORDER — ATORVASTATIN CALCIUM 80 MG PO TABS
80.0000 mg | ORAL_TABLET | Freq: Every day | ORAL | Status: DC
Start: 2024-01-29 — End: 2024-01-31
  Administered 2024-01-29 – 2024-01-30 (×2): 80 mg via ORAL
  Filled 2024-01-29 (×3): qty 1

## 2024-01-29 MED ORDER — TRAVASOL 10 % IV SOLN
INTRAVENOUS | Status: AC
  Filled 2024-01-29: qty 1100.2

## 2024-01-29 MED ORDER — SACUBITRIL-VALSARTAN 24-26 MG PO TABS
1.0000 | ORAL_TABLET | Freq: Two times a day (BID) | ORAL | Status: DC
  Administered 2024-01-29 – 2024-01-30 (×2): 1 via ORAL
  Filled 2024-01-29 (×2): qty 1

## 2024-01-29 MED ORDER — SPIRONOLACTONE 25 MG PO TABS
25.0000 mg | ORAL_TABLET | Freq: Every day | ORAL | Status: DC
  Administered 2024-01-30: 25 mg via ORAL
  Filled 2024-01-29 (×2): qty 1

## 2024-01-29 MED ORDER — SACUBITRIL-VALSARTAN 49-51 MG PO TABS
1.0000 | ORAL_TABLET | Freq: Two times a day (BID) | ORAL | Status: DC
Start: 2024-01-29 — End: 2024-01-29

## 2024-01-29 NOTE — Progress Notes (Signed)
 PHARMACY - ANTICOAGULATION CONSULT NOTE  Pharmacy Consult for heparn Indication: atrial fibrillation  Labs: Recent Labs    01/26/24 0320 01/26/24 0831 01/27/24 0435 01/28/24 0600 01/28/24 2256  HGB 10.1*  --  10.2* 9.7*  --   HCT 32.4*  --  31.9* 30.6*  --   PLT 209  --  229 259  --   APTT 41*  --   --  37* 41*  HEPARINUNFRC >1.10*  --   --  0.38  --   CREATININE  --  2.07* 1.80* 1.77*  --    Assessment: 73yo male remains subtherapeutic on heparin  with very little change in PTT after rate change; no infusion issues or signs of bleeding per RN.  Goal of Therapy:  aPTT 66-102 seconds   Plan:  Increase heparin  infusion by 3 units/kg/hr to 1000 units/hr. Check level in 8 hours.   Marvetta Dauphin, PharmD, BCPS 01/29/2024 12:10 AM

## 2024-01-29 NOTE — Progress Notes (Signed)
 PROGRESS NOTE  Billy [name] FMW:[id number] DOB: 1950/05/07 DOA: 01/21/2024 PCP: Seabron Lenis, MD   LOS: 8 days   Brief Narrative / Interim history: Billy [name] is a 73 y.o. male with a history of HFrEF, AFib, CAD, PE complicated by cardiogenic shock who presented to the ED on 01/21/2024 with several days of vomiting followed by an episode of severe tearing epigastric pain. He was found to have an esophageal tear. GI and cardiothoracic surgery were consulted, took for EGD with stent placement 10/7.   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 Shyrl 10/7.  Repeat swallow study did not show any leak, okay for diet, initially tried mechanical soft but still not eating, transition to dysphagia 1 today.  He needs to remain on this for several weeks. - Antibiotics have been transitioned to liquid formulation, plan for total of 6 weeks. - Per cardiothoracic surgery, he was placed back on heparin  10/11.  If he tolerates this well today, repeat hemoglobin tomorrow morning, may consider transitioning to oral agents  Active problems AKI on CKD3b -baseline creatinine in the low 2s, creatinine stable, 1.7 today  PAF, with RVR, history of DVT/PE -discussed with CT surgery, back on heparin  -Due to RVR now started on amiodarone  infusion, transition to oral per cardiology  Leukocytosis-slightly worsening today, closely monitor.  He is afebrile  Chronic HFrEF -appreciate heart failure follow-up, getting diuresed, further management per CHF team.  Currently on oral furosemide , Entresto , spironolactone  along with amiodarone  as above   Hyperkalemia -potassium pending today  Scheduled Meds:  amiodarone   400 mg Oral BID   amoxicillin-clavulanate  875 mg Oral Q12H   bisacodyl   10 mg Rectal Q0600   Chlorhexidine  Gluconate Cloth   6 each Topical Daily   insulin  aspart  10 Units Intravenous Once   And   dextrose   1 ampule Intravenous Once   feeding supplement  237 mL Oral BID BM   fluconazole  200 mg Oral Daily   furosemide   40 mg Oral Daily   insulin  aspart  0-15 Units Subcutaneous Q6H   levalbuterol  0.63 mg Nebulization Q6H   methocarbamol   500 mg Oral TID   multivitamin with minerals  1 tablet Oral Daily   pantoprazole  (PROTONIX ) IV  40 mg Intravenous QHS   sacubitril -valsartan   1 tablet Oral BID   sodium chloride  flush  10-40 mL Intracatheter Q12H   spironolactone   12.5 mg Oral Daily   Continuous Infusions:  calcium  gluconate     famotidine (PEPCID) IV 20 mg (01/29/24 0951)   heparin  1,000 Units/hr (01/29/24 0013)   TPN ADULT (ION) 80 mL/hr at 01/28/24 1716   PRN Meds:.benzonatate, HYDROmorphone  (DILAUDID ) injection, methocarbamol  (ROBAXIN ) injection, ondansetron  (ZOFRAN ) IV, mouth rinse, oxyCODONE , simethicone , sodium chloride  flush  Current Outpatient Medications  Medication Instructions   apixaban  (ELIQUIS ) 5 mg, Oral, 2 times daily   atorvastatin  (LIPITOR ) 80 mg, Oral, Daily   ezetimibe  (ZETIA ) 10 mg, Oral, Daily   ibuprofen (ADVIL) 400-600 mg, Oral, 2 times daily PRN   Multiple Vitamins-Minerals (MENS 50+ MULTIVITAMIN) TABS 1 tablet, Oral, Daily   sacubitril -valsartan  (ENTRESTO ) 97-103 MG 1 tablet, Oral, 2 times daily   torsemide  (DEMADEX ) 20 mg, Oral, Daily   Diet Orders (From admission, onward)     Start     Ordered   01/29/24 0954  DIET - DYS 1 Room service  appropriate? Yes; Fluid consistency: Thin  Diet effective now       Question Answer Comment  Room service appropriate? Yes   Fluid consistency: Thin      01/29/24 0953            DVT prophylaxis: SCDs Start: 01/21/24 2023   Lab Results  Component Value Date   PLT 329 01/29/2024      Code Status: Full Code  Family Communication: No family at bedside  Status is: Inpatient Remains inpatient appropriate because:  Severity of illness    Level of care: Progressive  Consultants:  Cardiothoracic surgery Cardiology  Objective: Vitals:   01/29/24 0400 01/29/24 0526 01/29/24 0741 01/29/24 0803  BP:  (!) 175/67  (!) 147/63  Pulse: 87 94 79 85  Resp:  18 (!) 21 19  Temp:  97.7 F (36.5 C)  98.1 F (36.7 C)  TempSrc:  Oral  Oral  SpO2: 96% 93% 95%   Weight:  79.5 kg    Height:        Intake/Output Summary (Last 24 hours) at 01/29/2024 0954 Last data filed at 01/29/2024 0526 Gross per 24 hour  Intake 240 ml  Output 450 ml  Net -210 ml   Wt Readings from Last 3 Encounters:  01/29/24 79.5 kg  12/19/23 75.7 kg  09/27/23 74.8 kg    Examination:  Constitutional: NAD Eyes: lids and conjunctivae normal, no scleral icterus ENMT: mmm Neck: normal, supple Respiratory: clear to auscultation bilaterally, no wheezing, no crackles.  Cardiovascular: Regular rate and rhythm, no murmurs / rubs / gallops. No LE edema. Abdomen: soft, no distention, no tenderness. Bowel sounds positive.   Data Reviewed: I have independently reviewed following labs and imaging studies   CBC Recent Labs  Lab 01/25/24 0307 01/26/24 0320 01/27/24 0435 01/28/24 0600 01/29/24 0924  WBC 11.7* 11.9* 14.2* 14.9* 19.2*  HGB 11.1* 10.1* 10.2* 9.7* 10.6*  HCT 35.8* 32.4* 31.9* 30.6* 34.2*  PLT 235 209 229 259 329  MCV 89.1 88.3 86.9 87.2 87.7  MCH 27.6 27.5 27.8 27.6 27.2  MCHC 31.0 31.2 32.0 31.7 31.0  RDW 18.5* 18.5* 18.4* 18.7* 18.3*  LYMPHSABS  --   --   --   --  1.0  MONOABS  --   --   --   --  1.4*  EOSABS  --   --   --   --  0.2  BASOSABS  --   --   --   --  0.1    Recent Labs  Lab 01/23/24 0430 01/23/24 1041 01/23/24 1202 01/23/24 1931 01/24/24 0208 01/25/24 0307 01/25/24 0855 01/25/24 1053 01/26/24 0320 01/26/24 0831 01/27/24 0435 01/28/24 0600  NA 142  --   --   --  140 139  --   --   --  141 139 141  K 5.5*   < >  --    < > 3.9 3.3*   < > 3.3* 3.3* 3.8 3.3* 3.5  CL 106  --   --   --  106  106  --   --   --  105 106 106  CO2 25  --   --   --  26 21*  --   --   --  23 23 23   GLUCOSE 175*  --   --   --  171* 167*  --   --   --  137* 175* 155*  BUN 56*  --   --   --  57* 54*  --   --   --  56* 54* 55*  CREATININE 2.68*  --   --   --  2.42* 2.10*  --   --   --  2.07* 1.80* 1.77*  CALCIUM  9.2  --   --   --  8.9 8.6*  --   --   --  8.5* 8.4* 8.3*  AST  --   --   --   --   --  18  --   --   --   --  34  --   ALT  --   --   --   --   --  17  --   --   --   --  30  --   ALKPHOS  --   --   --   --   --  45  --   --   --   --  94  --   BILITOT  --   --   --   --   --  1.3*  --   --   --   --  0.8  --   ALBUMIN   --   --   --   --   --  2.2*  --   --   --   --  1.8*  --   MG 2.4  --   --   --  2.3 2.0  --   --  2.0  --  2.1  --   HGBA1C  --   --  6.0*  --   --   --   --   --   --   --   --   --    < > = values in this interval not displayed.    ------------------------------------------------------------------------------------------------------------------ No results for input(s): CHOL, HDL, LDLCALC, TRIG, CHOLHDL, LDLDIRECT in the last 72 hours.   Lab Results  Component Value Date   HGBA1C 6.0 (H) 01/23/2024   ------------------------------------------------------------------------------------------------------------------ No results for input(s): TSH, T4TOTAL, T3FREE, THYROIDAB in the last 72 hours.  Invalid input(s): FREET3  Cardiac Enzymes No results for input(s): CKMB, TROPONINI, MYOGLOBIN in the last 168 hours.  Invalid input(s): CK ------------------------------------------------------------------------------------------------------------------    Component Value Date/Time   BNP 646.4 (H) 06/27/2023 1116    CBG: Recent Labs  Lab 01/28/24 0615 01/28/24 1111 01/28/24 1829 01/28/24 2255 01/29/24 0533  GLUCAP 138* 158* 116* 130* 126*    Recent Results (from the past 240 hours)  MRSA Next Gen by PCR, Nasal     Status: Abnormal    Collection Time: 01/21/24  9:27 PM   Specimen: Nasal Mucosa; Nasal Swab  Result Value Ref Range Status   MRSA by PCR Next Gen DETECTED (A) NOT DETECTED Final    Comment: RESULT CALLED TO, READ BACK BY AND VERIFIED WITH: J MCBRIDE RN 01/21/2024 @ 2334  (NOTE) The GeneXpert MRSA Assay (FDA approved for NASAL specimens only), is one component of a comprehensive MRSA colonization surveillance program. It is not intended to diagnose MRSA infection nor to guide or monitor treatment for MRSA infections. Test performance is not FDA approved in patients less than 8 years old. Performed at [facility], [address] [place], [place] [address]      Radiology Studies: No results found.   Nilda Fendt, MD, PhD [facility]  Between 7 am - 7 pm I am available, please contact me via Amion (for emergencies) or Securechat (  non urgent messages)  Between 7 pm - 7 am I am not available, please contact night coverage MD/APP via Amion
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
## 2024-01-29 NOTE — Progress Notes (Addendum)
 Calorie Count Note  No meal tickets recorded over the weekend, pt is a poor historian and does not remember exactly how much he had to eat over the weekend. RD went over dietary recall with pt and pt was only able to express he had bites of food. Does not drink the Mighty shake or Ensures.   Pt doing poorly with po intake. States he has no appetite and to not expect him to eat a truckload of food. RD discussed the importance of increasing po intake in order to DC TPN and to go home. Pt acknowledges but reports at baseline he was only eating 2 meals per day PTA. Pt did not touch his breakfast this morning, has no appetite. Drinking water .   Recommend continuing TPN as pt with very poor PO intake. Willing to try [employer]. Downgraded by MD to purees.   48 hour calorie count ordered.  Diet: Dysphagia 3, thin liquids  Supplements: Ensure High Protein, Mighty Shake   Day 1, 10/11: Breakfast: 100% cereal with 2% milk (190 kcal, 9 gm protein) Lunch: N/A Dinner: Bites of Tuna salad with chicken noodle soup and pudding (75 kcal, 4 gm protein) Supplements: 0  Total intake: 265 kcal (12% of minimum estimated needs)  13 gm protein (12% of minimum estimated needs)  Day 2, 10/12: Breakfast: Bites of cereal with 2% milk (50 kcal, 2 gm protein)  Lunch: Bites of pot roast with mashed potatoes, pudding, and ginger ale (75 kcal, 4 gm protein) Dinner: 25% Turkey with Mashed potatoes (75 kcal, 4 gm protein) Supplements: 0  Total intake: 200 kcal (9.5% of minimum estimated needs)  10 protein (9% of minimum estimated needs)  Estimated Nutritional Needs:  Kcal:  2100-2300 kcals Protein:  110-130 g Fluid:  2L  INTERVENTION:  Continue TPN per pharmacy MVI/Thiamine supplementation added to TPN Continue TPN until pt demonstrating tolerance solid food diet and eating adequately, meeting at least 50-60% of estimated needs. Pt has poor appetite and likely will not be able to consume  enough ONS to justify discontinuation of TPN as pt reports he does not tolerate ONS well.    Ensure Plus High Protein po BID, each supplement provides 350 kcal and 20 grams of protein Mighty Shake TID with meals, each supplement provides 330 kcals and 9 grams of protein *Prefers Engineer, materials TID, each packet mixed with 8 ounces of 2% milk provides 13 grams of protein and 260 calories.  MVI with minerals po Continue calorie count  Room service with assist    NUTRITION DIAGNOSIS:    Moderate Malnutrition related to acute illness (may have chronic component due to time frame of reported wt loss) as evidenced by mild fat depletion, percent weight loss, energy intake < or equal to 50% for > or equal to 5 days. - Ongoing    GOAL:    Patient will meet greater than or equal to 90% of their needs - Meeting via TPN   Olivia Kenning, RD Registered Dietitian  See Amion for more information

## 2024-01-29 NOTE — Progress Notes (Signed)
 Physical Therapy Treatment Patient Details Name: Billy [name] MRN: [id number] DOB: Nov 02, 1950 Today's Date: 01/29/2024   History of Present Illness Billy [name] is a 73 y.o. male who presented to [name] 01/21/24 with several days of vomiting followed by an episode of severe tearing epigastric pain. Work-up revealed esophageal tear. Pt s/p esophageal stent with EGD 10/7. PMHx: HFrEF, AFib, CAD, PE complicated by cardiogenic shock, CKD 4, MI, peripheral neuropathy, and TIA.    PT Comments  Pt received in supine, flat and slow to process orientation/mobility questions and cues, flat affect, but agreeable to participate in seated/standing transfer training and exercises with encouragement. Pt c/o moderate to severe mid upper quadrant pain post-op, requesting heating pack at end of session. SpO2 WFL on 2L O2 [place] per earlobe sensor due to poor signal on fingertips/cold hands. BP MAP (87) sitting EOB initially and MAP (75) sitting EOB after multiple sit to stand transfers. Pt needing up to CGA for safety and cues for cord/line awareness and safety, reliant on RW support for standing hip flexion and pre-gait tasks. Pt continues to benefit from PT services to progress toward functional mobility goals, continue to recommend HHPT but standing tolerance limited today due to fatigue/pain.     If plan is discharge home, recommend the following: A little help with walking and/or transfers;A little help with bathing/dressing/bathroom;Assistance with cooking/housework;Assist for transportation;Help with stairs or ramp for entrance   Can travel by private vehicle        Equipment Recommendations  Other (comment);None recommended by PT (consider wheelchair pending progress with ambulation)    Recommendations for Other Services       Precautions / Restrictions Precautions Precautions: Fall Recall of Precautions/Restrictions: Intact Precaution/Restrictions Comments: poor O2 signal on fingers; reads better on his  earlobe Restrictions Weight Bearing Restrictions Per Provider Order: No     Mobility  Bed Mobility Overal bed mobility: Needs Assistance Bed Mobility: Supine to Sit, Sit to Supine     Supine to sit: Contact guard, HOB elevated, Used rails Sit to supine: Contact guard assist, HOB elevated, Used rails   General bed mobility comments: Cues for safety/line awareness, pt very slow to initiate then suddenly impulsive to move quickly to sit up. Increased time to initiate return to supine, does not need BLE lift assist over HOB. HOB >30 degrees each transfer and bed rail used.    Transfers Overall transfer level: Needs assistance Equipment used: Rolling walker (2 wheels) Transfers: Sit to/from Stand, Bed to chair/wheelchair/BSC Sit to Stand: Contact guard assist   Step pivot transfers: Contact guard assist       General transfer comment: EOB<>RW, cues for safer UE placement and not to pull on RW wtih BUE to stand, and sidesteps ~27ft toward [name] with RW support and forward/backward at bedside, all with CGA.    Ambulation/Gait Ambulation/Gait assistance: Contact guard assist   Assistive device: Rolling walker (2 wheels)       Pre-gait activities: standing hip flexion x15 reps ea at RW, CGA General Gait Details: fatigues after pre-gait tasks so defer for pt safety   Stairs             Wheelchair Mobility     Tilt Bed    Modified Rankin (Stroke Patients Only)       Balance Overall balance assessment: Needs assistance Sitting-balance support: No upper extremity supported, Feet supported Sitting balance-Leahy Scale: Good     Standing balance support: During functional activity, Reliant on assistive device for balance, Single extremity  supported, Bilateral upper extremity supported Standing balance-Leahy Scale: Poor Standing balance comment: Fair with RW, Fair to poor unsupported                            Communication Communication Communication:  Impaired Factors Affecting Communication: Difficulty expressing self  Cognition Arousal: Alert Behavior During Therapy: Flat affect   PT - Cognitive impairments: Difficult to assess, No family/caregiver present to determine baseline, Initiation, Sequencing, Problem solving, Safety/Judgement Difficult to assess due to: Impaired communication                     PT - Cognition Comments: Pt often requesting more time, at times needing repetition of questions; unclear if due to pt hard of hearing or slow processing; Pt at times seeming to ignore question, not answering for 20-30 seconds, then when PTA repeats questions, pt states I need some time. No family present to report his baseline, but likely not at baseline. Very flat. Following commands: Impaired Following commands impaired: Follows one step commands inconsistently, Follows one step commands with increased time    Cueing Cueing Techniques: Verbal cues, Gestural cues  Exercises Other Exercises Other Exercises: reciprocal STS x 5 reps with use of arms and increased time to perform Other Exercises: standing hip flexion x15 reps with RW support    General Comments General comments (skin integrity, edema, etc.): See BP in comments above; c/o fatigue and impulsive to sit when fatigued, BP MAP (87) sitting EOB initially and MAP (75) sitting EOB after multiple sit<>stand transfers, c/o fatigue. Did not assess standing BP but may have been orthostatic. HR 80's-90's bpm with activity; SpO2 92-97% on 2L O2  supine and sitting (pt doffs sensor when standing due to irritation)      Pertinent Vitals/Pain Pain Assessment Pain Assessment: Faces Faces Pain Scale: Hurts even more Pain Location: abdomen; increases with activity Pain Descriptors / Indicators: Discomfort, Aching, Sore, Pressure, Guarding Pain Intervention(s): Limited activity within patient's tolerance, Monitored during session, Repositioned, Patient requesting pain meds-RN  notified, Other (comment) (RN notified pt interested in having K-pad set up and applied)    Home Living                          Prior Function            PT Goals (current goals can now be found in the care plan section) Acute Rehab PT Goals Patient Stated Goal: Return Home PT Goal Formulation: With patient Time For Goal Achievement: 02/07/24 Progress towards PT goals: Progressing toward goals    Frequency    Min 2X/week      PT Plan      Co-evaluation              AM-PAC PT 6 Clicks Mobility   Outcome Measure  Help needed turning from your back to your side while in a flat bed without using bedrails?: A Little Help needed moving from lying on your back to sitting on the side of a flat bed without using bedrails?: A Little Help needed moving to and from a bed to a chair (including a wheelchair)?: A Little Help needed standing up from a chair using your arms (e.g., wheelchair or bedside chair)?: A Little Help needed to walk in [name] room?: A Lot Help needed climbing 3-5 steps with a railing? : Total 6 Click Score: 15    End of Session  Equipment Utilized During Treatment: Oxygen ;Other (comment) (pt defer gait belt due to discomfort so defer gait in hallway for pt safety) Activity Tolerance: Patient tolerated treatment well;Patient limited by fatigue;Patient limited by pain Patient left: in bed;with call bell/phone within reach;with bed alarm set;Other (comment) (heels floated for comfort, HOB ~35 deg (defer flat as pt requesting to drink), pt impulsively lifting gown and doffing sheets and requesting heating pack, RN called to room to assist with K-pad set-up.) Nurse Communication: Mobility status;Patient requests pain meds;Precautions;Other (comment) (request assist with k-pad) PT Visit Diagnosis: Difficulty in walking, not elsewhere classified (R26.2);Unsteadiness on feet (R26.81)     Time: 8284-8254 PT Time Calculation (min) (ACUTE ONLY): 30  min  Charges:    $Therapeutic Exercise: 8-22 mins $Therapeutic Activity: 8-22 mins PT General Charges $$ ACUTE PT VISIT: 1 Visit                     Billy Mohamed P., PTA Acute Rehabilitation Services Secure Chat Preferred 9a-5:30pm Office: [phone]    [name] 01/29/2024, 6:08 PM

## 2024-01-29 NOTE — Progress Notes (Signed)
 PHARMACY - TOTAL PARENTERAL NUTRITION CONSULT NOTE   Indication: intolerance to enteral feedings, esophageal performation  Patient Measurements: Height: 6' 1 (185.4 cm) Weight: 79.5 kg (175 lb 4.3 oz) IBW/kg (Calculated) : 79.9 TPN AdjBW (KG): 72.8 Body mass index is 23.12 kg/m.  Assessment:  73 year old man with medical history significant for cardiac arrest earlier this year who was admitted after several days of nausea and vomiting from a stomach bug followed by sudden onset severe tearing epigastric pain radiating to back and chest. 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 Pharmacy consulted to initiate TPN due to inability to tolerate enteral nutrition at this time.  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 PTA takes torsemide  20 mg PO daily, now held. Aggressive fluid resuscitation recommended per GI. Per HF, patient currently euvolemic but will hold diuretic for now. Due to CHF will target lower end of fluid range for TPN and supplement additional fluids per MD outside of TPN to prevent fluid overload.   Stent placed on 10/6, esophogram not showing any leak. Diet advanced to CLD.   Glucose / Insulin : A1C (02/2023) 6.0; CBGs 116-150s, mSSI (7u/24h)  Electrolytes: Na 138, K 4.4, Cl 108, CO2 18, Ca 8.6 [CoCa 10.28], Mg 2.1, Phos 4.0  Renal: Scr downtrending  1.88 (bsl ~2), BUN 62 Hepatic: AST/ALT wnl, Alk Phos 127, tbili 1.1, alb 1.9 Intake / Output; MIVF: UOP charted 0.6 mL/kg/hr +3 unmeasured, LBM 10/13 Net IO Since Admission: 6,110.38 mL [01/29/24 0912]   GI Imaging: 10/06 Esophagogram/barium study: distal esophageal perforation 10/05 CT: concerning for esophageal perforation, masslike area of soft tissue thickening adjacent to and surrounding the esophagus - differential considerations remain esophageal mass, hiatal hernia, or infection GI Surgeries / Procedures:  10/6: esophageal stent placement   Central access:  01/22/24 TPN start date: 01/22/24  Nutritional Goals: Goal TPN rate is 80 mL/hr (provides ~110 g of protein and 2101 kcals per day)  RD Assessment: pending Estimated Needs Total Energy Estimated Needs: 2100-2300 kcals Total Protein Estimated Needs: 110-130 g Total Fluid Estimated Needs: 2L  Current Nutrition:  NPO 10/5 >>10/8 CLD 10/8 >>  TPN 10/6 >>  Plan:  Continue TPN to goal 80 mL/hr at 1800 to meet 100% total nutritional needs. Monitor volume status, low threshold to concentrate TPN given history of HF. Currently minimal free water  in TPN.  Electrolytes in TPN: Na 30 mEq/L, K 20 mEq/L, Ca 3 mEq/L, Mg 3 mEq/L, Phos 10 mmol/L. Cl:Ac max acetate Add standard MVI and trace elements to TPN Change to Moderate q6h SSI and adjust as needed Add 10 units insulin  to TPN  Monitor TPN labs on Mon/Thurs, daily while initiating TPN  Thank you for allowing pharmacy to be a part of this patient's care.  Benedetta Heath BS, PharmD, BCPS Clinical Pharmacist 01/29/2024 9:14 AM  Contact: [phone] after 3 PM

## 2024-01-29 NOTE — TOC Progression Note (Signed)
 Transition of Care [facility]) - Progression Note    Patient Details  Name: Billy [name] MRN: [id number] Date of Birth: [birth year]  Transition of [facility]) CM/SW Contact  Tom-Johnson, Jackline Castilla Daphne, RN Phone Number: 01/29/2024, 2:31 PM  Clinical Narrative:     CM spoke with patient at bedside about home health recommendations. Patient has no preference. CM called in referral to [facility], [facility], [facility] and [facility] voiced acceptance, info on AVS. Pam with [facility] confirmed referral acceptance for home IV abx.   CM will continue to follow as patient progresses with care towards discharge.       Expected Discharge Plan: Home/Self Care Barriers to Discharge: Continued Medical Work up               Expected Discharge Plan and Services   Discharge Planning Services: CM Consult   Living arrangements for the past 2 months: Single Family Home                                       Social Drivers of Health (SDOH) Interventions SDOH Screenings   Food Insecurity: No Food Insecurity (01/22/2024)  Housing: High Risk (01/22/2024)  Transportation Needs: No Transportation Needs (01/22/2024)  Utilities: Not At Risk (01/22/2024)  Depression (PHQ2-9): Low Risk  (12/01/2020)  Social Connections: Socially Isolated (01/22/2024)  Tobacco Use: Low Risk  (01/22/2024)    Readmission Risk Interventions    06/27/2023    1:52 PM 10/14/2022   11:29 AM  Readmission Risk Prevention Plan  Post Dischage Appt  Complete  Medication Screening  Complete  Transportation Screening Complete Complete  HRI or Home Care Consult Complete   Social Work Consult for Recovery Care Planning/Counseling Complete   Palliative Care Screening Not Applicable   Medication Review Oceanographer) Referral to Pharmacy

## 2024-01-29 NOTE — Progress Notes (Addendum)
 Patient ID: Mazen Marcin, male   DOB: 1950-06-27, 73 y.o.   MRN: [id number]     Advanced Heart Failure Rounding Note  Cardiologist: Dorn Lesches, MD  Chief Complaint: S/P esophageal stent Subjective:    CVP 15 on my read today.  Has been getting po Lasix  through the weekend.  Cough worse today, hurts his chest when he coughs.   He remains in atrial fibrillation on po amiodarone  and heparin  gtt.  Hgb higher.  Creatinine stable at 1.88.   Objective:    Weight Range: 79.5 kg Body mass index is 23.12 kg/m.   Vital Signs:   Temp:  [97.7 F (36.5 C)-98.9 F (37.2 C)] 98.1 F (36.7 C) (10/13 0803) Pulse Rate:  [78-97] 85 (10/13 1120) Resp:  [11-22] 20 (10/13 1120) BP: (132-175)/(55-68) 147/63 (10/13 0803) SpO2:  [91 %-98 %] 91 % (10/13 1120) Weight:  [79.5 kg] 79.5 kg (10/13 0526) Last BM Date : 01/29/24  Weight change: Filed Weights   01/27/24 0612 01/28/24 0616 01/29/24 0526  Weight: 76.9 kg 77.8 kg 79.5 kg   Intake/Output:  Intake/Output Summary (Last 24 hours) at 01/29/2024 1126 Last data filed at 01/29/2024 0526 Gross per 24 hour  Intake 240 ml  Output 450 ml  Net -210 ml    Physical Exam   General: NAD Neck:JVP 12 cm, no thyromegaly or thyroid  nodule.  Lungs: Decreased at bases.  CV: Nondisplaced PMI.  Heart regular S1/S2, no S3/S4, no murmur.  1+ ankle edema.  Abdomen: Soft, nontender, no hepatosplenomegaly, no distention.  Skin: Intact without lesions or rashes.  Neurologic: Alert and oriented x 3.  Psych: Normal affect. Extremities: No clubbing or cyanosis.  HEENT: Normal.   Telemetry   AF 90s (personally reviewed)  Labs    CBC Recent Labs    01/28/24 0600 01/29/24 0924  WBC 14.9* 19.2*  NEUTROABS  --  15.7*  HGB 9.7* 10.6*  HCT 30.6* 34.2*  MCV 87.2 87.7  PLT 259 329   Basic Metabolic Panel Recent Labs    89/88/74 0435 01/28/24 0600 01/29/24 0924  NA 139 141 138  K 3.3* 3.5 4.4  CL 106 106 108  CO2 23 23 18*  GLUCOSE 175* 155*  147*  BUN 54* 55* 62*  CREATININE 1.80* 1.77* 1.88*  CALCIUM  8.4* 8.3* 8.6*  MG 2.1  --  2.1  PHOS  --   --  4.0   Liver Function Tests Recent Labs    01/27/24 0435 01/29/24 0924  AST 34 32  ALT 30 39  ALKPHOS 94 127*  BILITOT 0.8 1.1  PROT 5.3* 5.8*  ALBUMIN  1.8* 1.9*   BNP (last 3 results) Recent Labs    03/09/23 1544 03/11/23 0218 06/27/23 1116  BNP 660.9* 491.3* 646.4*   Hemoglobin A1C No results for input(s): HGBA1C in the last 72 hours.  Fasting Lipid Panel Recent Labs    01/29/24 0924  TRIG 65    Medications:    Scheduled Medications:  amiodarone   400 mg Oral BID   amoxicillin-clavulanate  875 mg Oral Q12H   bisacodyl   10 mg Rectal Q0600   Chlorhexidine  Gluconate Cloth  6 each Topical Daily   insulin  aspart  10 Units Intravenous Once   And   dextrose   1 ampule Intravenous Once   feeding supplement  237 mL Oral BID BM   fluconazole  200 mg Oral Daily   insulin  aspart  0-15 Units Subcutaneous Q6H   levalbuterol  0.63 mg Nebulization Q6H   methocarbamol   500 mg Oral TID   multivitamin with minerals  1 tablet Oral Daily   pantoprazole  (PROTONIX ) IV  40 mg Intravenous QHS   sacubitril -valsartan   1 tablet Oral BID   sodium chloride  flush  10-40 mL Intracatheter Q12H   spironolactone   12.5 mg Oral Daily   [START ON 01/30/2024] torsemide   20 mg Oral Daily    Infusions:  calcium  gluconate     famotidine (PEPCID) IV 20 mg (01/29/24 0951)   heparin  1,000 Units/hr (01/29/24 0013)   TPN ADULT (ION) 80 mL/hr at 01/28/24 1716   TPN ADULT (ION)      PRN Medications: benzonatate, HYDROmorphone  (DILAUDID ) injection, methocarbamol  (ROBAXIN ) injection, ondansetron  (ZOFRAN ) IV, mouth rinse, oxyCODONE , simethicone , sodium chloride  flush  Patient Profile    Patient with longstanding history of chronic heart failure with preserved ejection fraction, RV failure with history of pulmonary embolism presents with esophageal perforation.  S/P Stent Esophageal  Stent   Assessment/Plan   1.  Esophageal Perforation - Boerhaave syndrome.  - CT C/A/P: with pneumomediastinum and large mass-like density in lower esophagus - Prior EGD 3/25: erosive gastritis, duodenal deformity, and bleeding friable duodenal mucosa s/p hemostatic 01/23/24 S/P EGD with esophageal stent placed  - On TPN and also has po diet, not eating much.  - Plan for 6 wks abx, on Augmentin.   2. Chronic HFpEF: h/o cardiogenic shock with RV failure in the setting of cardiac arrest thought to be caused by acute PE vs ACS in 3/25. Echo at the time showed EF 55%, severe, LVH, G2DD, and severely reduced RV function with strain. There was concern for cardiac sarcoid amyloid, however CMR LGE consistent with ischemic disease. RV on echo 9/25 with complete recovery, EF 60-65%, nl RV function.   CVP 15 today, volume overloaded on exam with worsening cough.  Creatinine stable at 1.88.  - RHC 3/25: Mild PAH with severe RV failure though CO is preserved.   - CVP 15 with worsening cough.  Has already had po Lasix  today, will give a dose of Lasix  80 mg IV x 1 and follow response.  - Continue Entresto  24/26 mg bid (on 97/103 bid at home) - Increase spironolactone  to 25 mg daily.  - GDMT limited by renal function    3. 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 Medical management.  - Restart atorvastatin  80 mg daily.    4.  CKD Stage 3b: Baseline sCr ~ 2.1. - AKI resolved - Creatinine 1.88 today.     5. PAF:   - s/p TEE/DCCV 3/25 - developed AF with RVR, unsure when as tele is not available  - Continue amiodarone  400 mg bid for rate control.  - He is on heparin  gtt, hgb remaining stable.  - Not candidate for TEE at this point with esophageal perforation and stent placement.  Would aim for DCCV after 4 wks therapeutic anticoagulation if he remains in AF.   6.  H/o DVT/PE:  Bilateral upper and lower DVTs, mixed acute and chronic.  -  s/p infrarenal IVC placement 3/25 - V/Q 3/25 with multiple perfusion defects - will need lifelong AC, now on heparin  gtt.    7. Carotid stenosis: h/o CVA. TCAR in 6/24. Okay to remain off plavix  at this point. - carotid US  9/25: widely patent carotid stent on R and no stenosis on L   Length of Stay: 8  Ezra Shuck, MD  01/29/2024, 11:26 AM  Advanced Heart Failure Team Pager [phone] (M-F; 7a - 5p)  Please contact [facility] for night-coverage after hours (5p -7a ) and weekends on [email or web address]

## 2024-01-30 ENCOUNTER — Inpatient Hospital Stay (HOSPITAL_COMMUNITY)

## 2024-01-30 DIAGNOSIS — K567 Ileus, unspecified: Secondary | ICD-10-CM | POA: Diagnosis not present

## 2024-01-30 DIAGNOSIS — K223 Perforation of esophagus: Secondary | ICD-10-CM | POA: Diagnosis not present

## 2024-01-30 DIAGNOSIS — D72829 Elevated white blood cell count, unspecified: Secondary | ICD-10-CM | POA: Diagnosis not present

## 2024-01-30 DIAGNOSIS — R918 Other nonspecific abnormal finding of lung field: Secondary | ICD-10-CM | POA: Diagnosis not present

## 2024-01-30 DIAGNOSIS — Z9889 Other specified postprocedural states: Secondary | ICD-10-CM | POA: Diagnosis not present

## 2024-01-30 DIAGNOSIS — Z4682 Encounter for fitting and adjustment of non-vascular catheter: Secondary | ICD-10-CM | POA: Diagnosis not present

## 2024-01-30 DIAGNOSIS — J90 Pleural effusion, not elsewhere classified: Secondary | ICD-10-CM | POA: Diagnosis not present

## 2024-01-30 DIAGNOSIS — N1832 Chronic kidney disease, stage 3b: Secondary | ICD-10-CM | POA: Diagnosis not present

## 2024-01-30 DIAGNOSIS — I5022 Chronic systolic (congestive) heart failure: Secondary | ICD-10-CM | POA: Diagnosis not present

## 2024-01-30 DIAGNOSIS — R0602 Shortness of breath: Secondary | ICD-10-CM | POA: Diagnosis not present

## 2024-01-30 LAB — COMPREHENSIVE METABOLIC PANEL WITH GFR
ALT: 47 U/L — ABNORMAL HIGH (ref 0–44)
AST: 42 U/L — ABNORMAL HIGH (ref 15–41)
Albumin: 1.9 g/dL — ABNORMAL LOW (ref 3.5–5.0)
Alkaline Phosphatase: 156 U/L — ABNORMAL HIGH (ref 38–126)
Anion gap: 13 (ref 5–15)
BUN: 77 mg/dL — ABNORMAL HIGH (ref 8–23)
CO2: 19 mmol/L — ABNORMAL LOW (ref 22–32)
Calcium: 8.6 mg/dL — ABNORMAL LOW (ref 8.9–10.3)
Chloride: 104 mmol/L (ref 98–111)
Creatinine, Ser: 2.02 mg/dL — ABNORMAL HIGH (ref 0.61–1.24)
GFR, Estimated: 35 mL/min — ABNORMAL LOW (ref >60)
Glucose, Bld: 123 mg/dL — ABNORMAL HIGH (ref 70–99)
Potassium: 4.5 mmol/L (ref 3.5–5.1)
Sodium: 136 mmol/L (ref 135–145)
Total Bilirubin: 1.1 mg/dL (ref 0.0–1.2)
Total Protein: 6 g/dL — ABNORMAL LOW (ref 6.5–8.1)

## 2024-01-30 LAB — CBC
HCT: 32.8 % — ABNORMAL LOW (ref 39.0–52.0)
Hemoglobin: 10.3 g/dL — ABNORMAL LOW (ref 13.0–17.0)
MCH: 27.8 pg (ref 26.0–34.0)
MCHC: 31.4 g/dL (ref 30.0–36.0)
MCV: 88.4 fL (ref 80.0–100.0)
Platelets: 403 K/uL — ABNORMAL HIGH (ref 150–400)
RBC: 3.71 MIL/uL — ABNORMAL LOW (ref 4.22–5.81)
RDW: 18.2 % — ABNORMAL HIGH (ref 11.5–15.5)
WBC: 22 K/uL — ABNORMAL HIGH (ref 4.0–10.5)
nRBC: 0.1 % (ref 0.0–0.2)

## 2024-01-30 LAB — GLUCOSE, CAPILLARY
Glucose-Capillary: 132 mg/dL — ABNORMAL HIGH (ref 70–99)
Glucose-Capillary: 139 mg/dL — ABNORMAL HIGH (ref 70–99)
Glucose-Capillary: 145 mg/dL — ABNORMAL HIGH (ref 70–99)
Glucose-Capillary: 155 mg/dL — ABNORMAL HIGH (ref 70–99)

## 2024-01-30 LAB — HEPARIN LEVEL (UNFRACTIONATED)
Heparin Unfractionated: 0.25 [IU]/mL — ABNORMAL LOW (ref 0.30–0.70)
Heparin Unfractionated: 0.23 [IU]/mL — ABNORMAL LOW (ref 0.30–0.70)

## 2024-01-30 LAB — PROCALCITONIN: Procalcitonin: 1.32 ng/mL

## 2024-01-30 LAB — APTT: aPTT: 51 s — ABNORMAL HIGH (ref 24–36)

## 2024-01-30 LAB — MAGNESIUM: Magnesium: 2.5 mg/dL — ABNORMAL HIGH (ref 1.7–2.4)

## 2024-01-30 MED ORDER — FUROSEMIDE 10 MG/ML IJ SOLN: 80.0000 mg | Freq: Once | INTRAMUSCULAR | Status: DC

## 2024-01-30 MED ORDER — PIPERACILLIN-TAZOBACTAM 3.375 G IVPB
3.3750 g | Freq: Three times a day (TID) | INTRAVENOUS | Status: DC
  Administered 2024-01-30 – 2024-02-04 (×14): 3.375 g via INTRAVENOUS
  Filled 2024-01-30 (×16): qty 50

## 2024-01-30 MED ORDER — SODIUM CHLORIDE (PF) 0.9 % IJ SOLN
INTRAMUSCULAR | Status: AC
  Administered 2024-01-30: 10 mL
  Filled 2024-01-30: qty 10

## 2024-01-30 MED ORDER — HYDRALAZINE HCL 25 MG PO TABS
25.0000 mg | ORAL_TABLET | Freq: Three times a day (TID) | ORAL | Status: DC
  Administered 2024-01-30 – 2024-01-31 (×3): 25 mg via ORAL
  Filled 2024-01-30 (×3): qty 1

## 2024-01-30 MED ORDER — FUROSEMIDE 10 MG/ML IJ SOLN
80.0000 mg | Freq: Four times a day (QID) | INTRAMUSCULAR | Status: AC
  Administered 2024-01-30 (×2): 80 mg via INTRAVENOUS
  Filled 2024-01-30 (×2): qty 8

## 2024-01-30 MED ORDER — TRAVASOL 10 % IV SOLN
INTRAVENOUS | Status: AC
  Filled 2024-01-30: qty 1100.2

## 2024-01-30 NOTE — Plan of Care (Signed)

## 2024-01-30 NOTE — Progress Notes (Addendum)
 Patient ID: Billy [name], male   DOB: [birth year], 73 y.o.   MRN: [id number]     Advanced Heart Failure Rounding Note  Cardiologist: Dorn Lesches, MD   Chief Complaint: S/P esophageal stent  Subjective:    Not feeling well overall. Very weak. States he can't eat. Slow to respond to questions.  CVP 12. Received 80 IV lasix  yesterday. Scr 1.9>2  WBCs up to 22K. Afebrile.    Objective:    Weight Range: 80.6 kg Body mass index is 23.46 kg/m.   Vital Signs:   Temp:  [97.9 F (36.6 C)-99.3 F (37.4 C)] 97.9 F (36.6 C) (10/14 1143) Pulse Rate:  [77-90] 79 (10/14 1143) Resp:  [17-20] 17 (10/14 1143) BP: (121-158)/(52-76) 127/54 (10/14 1143) SpO2:  [93 %-97 %] 97 % (10/14 1143) Weight:  [80.6 kg] 80.6 kg (10/14 0500) Last BM Date : 01/29/24  Weight change: Filed Weights   01/28/24 0616 01/29/24 0526 01/30/24 0500  Weight: 77.8 kg 79.5 kg 80.6 kg   Intake/Output:  Intake/Output Summary (Last 24 hours) at 01/30/2024 1234 Last data filed at 01/30/2024 0400 Gross per 24 hour  Intake 1732.27 ml  Output 1300 ml  Net 432.27 ml    Physical Exam  General:  Appears fatigued, chronically ill. Diaphoretic Neck: JVP 10-12 Cor: Irregular rhythm. Nomurmurs. Lungs: breathing nonlabored Abdomen: taut Extremities: 1+ edema Neuro: alert & orientedx3. Slow to respond to questions.  Telemetry   AF 80s  Labs    CBC Recent Labs    01/29/24 0924 01/30/24 0252  WBC 19.2* 22.0*  NEUTROABS 15.7*  --   HGB 10.6* 10.3*  HCT 34.2* 32.8*  MCV 87.7 88.4  PLT 329 403*   Basic Metabolic Panel Recent Labs    89/86/74 0924 01/30/24 0252  NA 138 136  K 4.4 4.5  CL 108 104  CO2 18* 19*  GLUCOSE 147* 123*  BUN 62* 77*  CREATININE 1.88* 2.02*  CALCIUM  8.6* 8.6*  MG 2.1 2.5*  PHOS 4.0  --    Liver Function Tests Recent Labs    01/29/24 0924 01/30/24 0252  AST 32 42*  ALT 39 47*  ALKPHOS 127* 156*  BILITOT 1.1 1.1  PROT 5.8* 6.0*  ALBUMIN  1.9* 1.9*   BNP (last 3  results) Recent Labs    03/09/23 1544 03/11/23 0218 06/27/23 1116  BNP 660.9* 491.3* 646.4*   Hemoglobin A1C No results for input(s): HGBA1C in the last 72 hours.  Fasting Lipid Panel Recent Labs    01/29/24 0924  TRIG 65    Medications:    Scheduled Medications:  amiodarone   400 mg Oral BID   amoxicillin-clavulanate  875 mg Oral Q12H   atorvastatin   80 mg Oral Daily   bisacodyl   10 mg Rectal Q0600   Chlorhexidine  Gluconate Cloth  6 each Topical Daily   insulin  aspart  10 Units Intravenous Once   And   dextrose   1 ampule Intravenous Once   feeding supplement  237 mL Oral BID BM   fluconazole  200 mg Oral Daily   insulin  aspart  0-15 Units Subcutaneous Q6H   levalbuterol  0.63 mg Nebulization Q6H   methocarbamol   500 mg Oral TID   multivitamin with minerals  1 tablet Oral Daily   pantoprazole  (PROTONIX ) IV  40 mg Intravenous QHS   sacubitril -valsartan   1 tablet Oral BID   sodium chloride  flush  10-40 mL Intracatheter Q12H   spironolactone   25 mg Oral Daily    Infusions:  calcium  gluconate  famotidine (PEPCID) IV 20 mg (01/30/24 1010)   heparin  1,400 Units/hr (01/30/24 1009)   TPN ADULT (ION) 80 mL/hr at 01/30/24 0400   TPN ADULT (ION)      PRN Medications: benzonatate, guaiFENesin -dextromethorphan, HYDROmorphone  (DILAUDID ) injection, methocarbamol  (ROBAXIN ) injection, ondansetron  (ZOFRAN ) IV, mouth rinse, oxyCODONE , simethicone , sodium chloride  flush  Patient Profile    Patient with longstanding history of chronic heart failure with preserved ejection fraction, RV failure with history of pulmonary embolism presents with esophageal perforation.  S/P Stent Esophageal Stent   Assessment/Plan   1.  Esophageal Perforation - Boerhaave syndrome.  - CT C/A/P: with pneumomediastinum and large mass-like density in lower esophagus - Prior EGD 3/25: erosive gastritis, duodenal deformity, and bleeding friable duodenal mucosa s/p hemostatic 01/23/24 S/P EGD with  esophageal stent placed  - On TPN and also has po diet, not eating much.  - On po abx, Augmentin & fluconazole X 6 weeks. Concern about worsening leukocytosis, 22K today. He is afebrile. Check PCT. TCTS re-consulted today. Will review today's CXR with Dr. Rolan. May need to consider escalating abx.  2. Chronic HFpEF: h/o cardiogenic shock with RV failure in the setting of cardiac arrest thought to be caused by acute PE vs ACS in 3/25. Echo at the time showed EF 55%, severe, LVH, G2DD, and severely reduced RV function with strain. There was concern for cardiac sarcoid amyloid, however CMR LGE consistent with ischemic disease. RV on echo 9/25 with complete recovery, EF 60-65%, nl RV function.   CVP 15 today, volume overloaded on exam with worsening cough.  Creatinine stable at 1.88.  - RHC 3/25: Mild PAH with severe RV failure though CO is preserved.   - CVP 12. Give 80 mg lasix  IV BID.  - Hold entresto  with worsening SCr - Increase spironolactone  to 25 mg daily.  - GDMT limited by renal function    3. 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 Medical management.  - Restart atorvastatin  80 mg daily.    4.  CKD Stage 3b: Baseline sCr ~ 2.1. - AKI resolved - Creatinine slightly higher 1.9>2 today    5. PAF:   - s/p TEE/DCCV 3/25 - developed AF with RVR, unsure when as tele is not available  - Continue amiodarone  400 mg bid for rate control.  - He is on heparin  gtt, hgb remaining stable.  - Not candidate for TEE at this point with esophageal perforation and stent placement.  Would aim for DCCV after 4 wks therapeutic anticoagulation if he remains in AF.   6.  H/o DVT/PE:  Bilateral upper and lower DVTs, mixed acute and chronic.  - s/p infrarenal IVC placement 3/25 - V/Q 3/25 with multiple perfusion defects - will need lifelong AC, now on heparin  gtt.    7. Carotid stenosis: h/o CVA. TCAR in 6/24. Okay to remain off plavix  at  this point. - carotid US  9/25: widely patent carotid stent on R and no stenosis on L   Length of Stay: 9  FINCH, LINDSAY N, PA-C  01/30/2024, 12:34 PM  Advanced Heart Failure Team Pager [phone] (M-F; 7a - 5p)  Please contact [facility] for night-coverage after hours (5p -7a ) and weekends on [email or web address]  Patient seen with PA, I formulated the plan and agree with the above note.   WBCs up to 22 today, Tm 99.  He remains on Augmentin/fluconazole.  CXR with LLL infiltrate.  Somewhat slow and  lethargic but answers all questions.   Creatinine higher at 2.02 today, UOP was not vigorous yesterday. He had 1 dose of IV Lasix . SBP 120s.  CVP 14 on my read.   General: NAD Neck: JVP 12 cm, no thyromegaly or thyroid  nodule.  Lungs: Clear to auscultation bilaterally with normal respiratory effort. CV: Nondisplaced PMI.  Heart regular S1/S2, no S3/S4, no murmur.  1+ ankle edema.  Abdomen: Soft, nontender, no hepatosplenomegaly, no distention.  Skin: Intact without lesions or rashes.  Neurologic: Lethargic but answers all questions.   Psych: Normal affect. Extremities: No clubbing or cyanosis.  HEENT: Normal.   With CXR infiltrate and rising WBCs, worry about infection/PNA at this point.   - Will send procalcitonin.  - Abx per primary service.   Still volume overloaded on exam though creatinine is up.   - Lasix  80 mg IV bid x 2 doses today. Follow creatinine closely.  - Stop Entresto  with rising creatinine (EF is normal) and will start hydralazine  for BP control.   He remains in AF on amiodarone  po and heparin  gtt. As above, not candidate for TEE with recent esophageal rupture.  Aim for DCCV after 4 wks therapeutic anticoagulation.   Ezra Shuck 01/30/2024 2:12 PM

## 2024-01-30 NOTE — Progress Notes (Addendum)
 8 Days Post-Op Procedure(s) (LRB): EGD (ESOPHAGOGASTRODUODENOSCOPY) WITH STENT PLACEMENT (N/A) Subjective: Some abdominal pain and increased sistension  Objective: Vital signs in last 24 hours: Temp:  [98 F (36.7 C)-98.8 F (37.1 C)] 98.8 F (37.1 C) (10/14 0300) Pulse Rate:  [77-97] 81 (10/14 0300) Cardiac Rhythm: Atrial fibrillation (10/13 2000) Resp:  [19-21] 20 (10/14 0300) BP: (128-158)/(52-76) 128/65 (10/14 0300) SpO2:  [89 %-97 %] 97 % (10/14 0300) Weight:  [80.6 kg] 80.6 kg (10/14 0500)  Hemodynamic parameters for last 24 hours: CVP:  [7 mmHg-15 mmHg] 8 mmHg  Intake/Output from previous day: 10/13 0701 - 10/14 0700 In: 2142.9 [P.O.:120; I.V.:1926.7; IV Piggyback:96.3] Out: 1300 [Urine:1300] Intake/Output this shift: No intake/output data recorded.  General appearance: alert, cooperative, distracted, fatigued, and no distress Heart: irregularly irregular rhythm Lungs: some diffuse crackles Abdomen: + distension, minor TTP Extremities: trace edema Wound: N/A  Lab Results: Recent Labs    01/29/24 0924 01/30/24 0252  WBC 19.2* 22.0*  HGB 10.6* 10.3*  HCT 34.2* 32.8*  PLT 329 403*   BMET:  Recent Labs    01/29/24 0924 01/30/24 0252  NA 138 136  K 4.4 4.5  CL 108 104  CO2 18* 19*  GLUCOSE 147* 123*  BUN 62* 77*  CREATININE 1.88* 2.02*  CALCIUM  8.6* 8.6*    PT/INR: No results for input(s): LABPROT, INR in the last 72 hours. ABG    Component Value Date/Time   PHART 7.395 07/05/2023 1646   HCO3 20.1 07/05/2023 1646   TCO2 23 01/21/2024 1500   ACIDBASEDEF 4.0 (H) 07/05/2023 1646   O2SAT 61.7 01/26/2024 1023   CBG (last 3)  Recent Labs    01/29/24 1828 01/30/24 0005 01/30/24 0631  GLUCAP 127* 132* 155*    Meds Scheduled Meds:  amiodarone   400 mg Oral BID   amoxicillin-clavulanate  875 mg Oral Q12H   atorvastatin   80 mg Oral Daily   bisacodyl   10 mg Rectal Q0600   Chlorhexidine  Gluconate Cloth  6 each Topical Daily   insulin  aspart   10 Units Intravenous Once   And   dextrose   1 ampule Intravenous Once   feeding supplement  237 mL Oral BID BM   fluconazole  200 mg Oral Daily   insulin  aspart  0-15 Units Subcutaneous Q6H   levalbuterol  0.63 mg Nebulization Q6H   methocarbamol   500 mg Oral TID   multivitamin with minerals  1 tablet Oral Daily   pantoprazole  (PROTONIX ) IV  40 mg Intravenous QHS   sacubitril -valsartan   1 tablet Oral BID   sodium chloride  flush  10-40 mL Intracatheter Q12H   spironolactone   25 mg Oral Daily   Continuous Infusions:  calcium  gluconate     famotidine (PEPCID) IV Stopped (01/29/24 2313)   heparin  1,400 Units/hr (01/30/24 0400)   TPN ADULT (ION) 80 mL/hr at 01/30/24 0400   PRN Meds:.benzonatate, guaiFENesin -dextromethorphan, HYDROmorphone  (DILAUDID ) injection, methocarbamol  (ROBAXIN ) injection, ondansetron  (ZOFRAN ) IV, mouth rinse, oxyCODONE , simethicone , sodium chloride  flush  Xrays No results found.  Assessment/Plan: S/P Procedure(s) (LRB): EGD (ESOPHAGOGASTRODUODENOSCOPY) WITH STENT PLACEMENT (N/A)  1 afeb, s BP 130's-150's , afib- on heparin  gtt, AHF also managing GDMT/diuresis 2 O2 sats have been ok on RA 3 good UOP 4 + BM 5 minimal po intake, conts TPN 6 creat relatively stable , 2.02 today, BUN rising, 77 7 leukocytosis trending higher- currently on augmentin and fluconazole 8 medicine has ordered a CXR- dep on findings, may need to re-evaluate stent 9 H/H fairly stable- cont to  monitor closely on heparin   Will d/w MD    LOS: 9 days    Lemond FORBES Cera PA-C Pager [phone] 01/30/2024   Stent position unchanged on CXR. Unclear source for leukocytosis.  Consider switching back to zosyn   Kiyara Bouffard O Nyra Anspaugh

## 2024-01-30 NOTE — Progress Notes (Signed)
 PROGRESS NOTE  Billy [name] FMW:[id number] DOB: 1950-12-27 DOA: 01/21/2024 PCP: Seabron Lenis, MD   LOS: 9 days   Brief Narrative / Interim history: Billy [name] is a 73 y.o. male with a history of HFrEF, AFib, CAD, PE complicated by cardiogenic shock who presented to the ED on 01/21/2024 with several days of vomiting followed by an episode of severe tearing epigastric pain. He was found to have an esophageal tear. GI and cardiothoracic surgery were consulted, took for EGD with stent placement 10/7.   Subjective / 24h Interval events: Continues to complain of difficulties eating, he is barely eating anything.  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 Shyrl 10/7.  Repeat swallow study did not show any leak, he tolerated clears and then transition to dysphagia 1 after speaking with cardiothoracic surgery.  He needs to remain on this for several weeks. - Antibiotics have been transitioned to liquid formulation, plan for total of 6 weeks of Augmentin as well as fluconazole - Per cardiothoracic surgery, he was placed back on heparin  10/11.  Keep on heparin  now while trying to sort out his worsening leukocytosis before converting to oral anticoagulants  Active problems AKI on CKD3b -baseline creatinine in the low 2s, creatinine 1.7 yesterday and slightly up today due to diuresis.  Still close to his baseline  PAF, with RVR, history of DVT/PE -discussed with CT surgery, back on heparin  -Due to RVR now started on amiodarone  infusion, transitioned to oral per cardiology, currently on 400 mg twice daily  Leukocytosis-continues to get worse today, up to 22,000.  He is afebrile.  Chest x-ray with some evidence of fluid overload.  Due to worsening white count and ongoing poor p.o. intake due to chest discomfort, cardiothoracic surgery was reconsulted today.   Appreciate input  Chronic HFrEF -appreciate heart failure follow-up, getting diuresed, further management per CHF team.  Has been diuresed with IV furosemide , converted to oral, now on hold due to slight rise in creatinine.  Continue Entresto , spironolactone    Hyperkalemia -potassium pending today  Scheduled Meds:  amiodarone   400 mg Oral BID   amoxicillin-clavulanate  875 mg Oral Q12H   atorvastatin   80 mg Oral Daily   bisacodyl   10 mg Rectal Q0600   Chlorhexidine  Gluconate Cloth  6 each Topical Daily   insulin  aspart  10 Units Intravenous Once   And   dextrose   1 ampule Intravenous Once   feeding supplement  237 mL Oral BID BM   fluconazole  200 mg Oral Daily   insulin  aspart  0-15 Units Subcutaneous Q6H   levalbuterol  0.63 mg Nebulization Q6H   methocarbamol   500 mg Oral TID   multivitamin with minerals  1 tablet Oral Daily   pantoprazole  (PROTONIX ) IV  40 mg Intravenous QHS   sacubitril -valsartan   1 tablet Oral BID   sodium chloride  flush  10-40 mL Intracatheter Q12H   spironolactone   25 mg Oral Daily   Continuous Infusions:  calcium  gluconate     famotidine (PEPCID) IV Stopped (01/29/24 2313)   heparin  1,400 Units/hr (01/30/24 0400)   TPN ADULT (ION) 80 mL/hr at 01/30/24 0400   TPN ADULT (ION)     PRN Meds:.benzonatate, guaiFENesin -dextromethorphan, HYDROmorphone  (DILAUDID ) injection, methocarbamol  (ROBAXIN ) injection, ondansetron  (ZOFRAN ) IV, mouth rinse, oxyCODONE , simethicone , sodium chloride  flush  Current Outpatient Medications  Medication Instructions   apixaban  (ELIQUIS ) 5 mg, Oral, 2 times daily  atorvastatin  (LIPITOR ) 80 mg, Oral, Daily   ezetimibe  (ZETIA ) 10 mg, Oral, Daily   ibuprofen (ADVIL) 400-600 mg, Oral, 2 times daily PRN   Multiple Vitamins-Minerals (MENS 50+ MULTIVITAMIN) TABS 1 tablet, Oral, Daily   sacubitril -valsartan  (ENTRESTO ) 97-103 MG 1 tablet, Oral, 2 times daily   torsemide  (DEMADEX ) 20 mg, Oral, Daily   Diet Orders (From admission,  onward)     Start     Ordered   01/29/24 1009  DIET - DYS 1 Room service appropriate? Yes with Assist; Fluid consistency: Thin  Diet effective now       Question Answer Comment  Room service appropriate? Yes with Assist   Fluid consistency: Thin      01/29/24 1008            DVT prophylaxis: SCDs Start: 01/21/24 2023   Lab Results  Component Value Date   PLT 403 (H) 01/30/2024      Code Status: Full Code  Family Communication: No family at bedside  Status is: Inpatient Remains inpatient appropriate because: Severity of illness    Level of care: Progressive  Consultants:  Cardiothoracic surgery Cardiology  Objective: Vitals:   01/30/24 0300 01/30/24 0500 01/30/24 0743 01/30/24 0748  BP: 128/65   (!) 121/52  Pulse: 81  82 77  Resp: 20  18 18   Temp: 98.8 F (37.1 C)   99.3 F (37.4 C)  TempSrc: Oral   Axillary  SpO2: 97%  97% 95%  Weight:  80.6 kg    Height:        Intake/Output Summary (Last 24 hours) at 01/30/2024 0948 Last data filed at 01/30/2024 0400 Gross per 24 hour  Intake 2142.93 ml  Output 1300 ml  Net 842.93 ml   Wt Readings from Last 3 Encounters:  01/30/24 80.6 kg  12/19/23 75.7 kg  09/27/23 74.8 kg    Examination:  Constitutional: NAD Eyes: lids and conjunctivae normal, no scleral icterus ENMT: mmm Neck: normal, supple Respiratory: clear to auscultation bilaterally, no wheezing, no crackles. Cardiovascular: Regular rate and rhythm, no murmurs / rubs / gallops. No LE edema. Abdomen: soft, no distention, no tenderness. Bowel sounds positive.   Data Reviewed: I have independently reviewed following labs and imaging studies   CBC Recent Labs  Lab 01/26/24 0320 01/27/24 0435 01/28/24 0600 01/29/24 0924 01/30/24 0252  WBC 11.9* 14.2* 14.9* 19.2* 22.0*  HGB 10.1* 10.2* 9.7* 10.6* 10.3*  HCT 32.4* 31.9* 30.6* 34.2* 32.8*  PLT 209 229 259 329 403*  MCV 88.3 86.9 87.2 87.7 88.4  MCH 27.5 27.8 27.6 27.2 27.8  MCHC 31.2 32.0  31.7 31.0 31.4  RDW 18.5* 18.4* 18.7* 18.3* 18.2*  LYMPHSABS  --   --   --  1.0  --   MONOABS  --   --   --  1.4*  --   EOSABS  --   --   --  0.2  --   BASOSABS  --   --   --  0.1  --     Recent Labs  Lab 01/23/24 1202 01/23/24 1931 01/25/24 0307 01/25/24 0855 01/26/24 0320 01/26/24 0831 01/27/24 0435 01/28/24 0600 01/29/24 0924 01/30/24 0252  NA  --    < > 139  --   --  141 139 141 138 136  K  --    < > 3.3*   < > 3.3* 3.8 3.3* 3.5 4.4 4.5  CL  --    < > 106  --   --  105 106 106 108 104  CO2  --    < > 21*  --   --  23 23 23  18* 19*  GLUCOSE  --    < > 167*  --   --  137* 175* 155* 147* 123*  BUN  --    < > 54*  --   --  56* 54* 55* 62* 77*  CREATININE  --    < > 2.10*  --   --  2.07* 1.80* 1.77* 1.88* 2.02*  CALCIUM   --    < > 8.6*  --   --  8.5* 8.4* 8.3* 8.6* 8.6*  AST  --   --  18  --   --   --  34  --  32 42*  ALT  --   --  17  --   --   --  30  --  39 47*  ALKPHOS  --   --  45  --   --   --  94  --  127* 156*  BILITOT  --   --  1.3*  --   --   --  0.8  --  1.1 1.1  ALBUMIN   --   --  2.2*  --   --   --  1.8*  --  1.9* 1.9*  MG  --    < > 2.0  --  2.0  --  2.1  --  2.1 2.5*  HGBA1C 6.0*  --   --   --   --   --   --   --   --   --    < > = values in this interval not displayed.    ------------------------------------------------------------------------------------------------------------------ Recent Labs    01/29/24 0924  TRIG 65     Lab Results  Component Value Date   HGBA1C 6.0 (H) 01/23/2024   ------------------------------------------------------------------------------------------------------------------ No results for input(s): TSH, T4TOTAL, T3FREE, THYROIDAB in the last 72 hours.  Invalid input(s): FREET3  Cardiac Enzymes No results for input(s): CKMB, TROPONINI, MYOGLOBIN in the last 168 hours.  Invalid input(s):  CK ------------------------------------------------------------------------------------------------------------------    Component Value Date/Time   BNP 646.4 (H) 06/27/2023 1116    CBG: Recent Labs  Lab 01/29/24 0533 01/29/24 1123 01/29/24 1828 01/30/24 0005 01/30/24 0631  GLUCAP 126* 158* 127* 132* 155*    Recent Results (from the past 240 hours)  MRSA Next Gen by PCR, Nasal     Status: Abnormal   Collection Time: 01/21/24  9:27 PM   Specimen: Nasal Mucosa; Nasal Swab  Result Value Ref Range Status   MRSA by PCR Next Gen DETECTED (A) NOT DETECTED Final    Comment: RESULT CALLED TO, READ BACK BY AND VERIFIED WITH: J MCBRIDE RN 01/21/2024 @ 2334  (NOTE) The GeneXpert MRSA Assay (FDA approved for NASAL specimens only), is one component of a comprehensive MRSA colonization surveillance program. It is not intended to diagnose MRSA infection nor to guide or monitor treatment for MRSA infections. Test performance is not FDA approved in patients less than 10 years old. Performed at [facility], [address], [place], [place] [address]      Radiology Studies: DG CHEST PORT 1 VIEW Result Date: 01/30/2024 EXAM: 1 VIEW(S) XRAY OF THE CHEST 01/30/2024 08:34:48 AM COMPARISON: CT 01/21/2024. CLINICAL HISTORY: Leukocytosis, SOB. FINDINGS: LINES, TUBES AND DEVICES: Right arm PICC line to the distal SVC. Implanted event monitor overlies the left chest. Interval placement of enteric stent  across the GE junction, distal margin not visualized. LUNGS AND PLEURA: Persistent somewhat coarse interstitial and airspace opacities in the left lower lung. Suspected pleural effusions left greater than right. No pulmonary edema. No pneumothorax. HEART AND MEDIASTINUM: Heart size upper limits normal. Aortic calcification. BONES AND SOFT TISSUES: No acute osseous abnormality. IMPRESSION: 1. Persistent interstitial and airspace opacities in the left lower lung with small pleural effusions, left  greater than right. 2. Interval placement of an enteric stent traversing the gastroesophageal junction; distal extent not visualized. Electronically signed by: Katheleen Faes MD 01/30/2024 08:46 AM EDT RP Workstation: [id number]     Nilda Fendt, MD, PhD [facility]  Between 7 am - 7 pm I am available, please contact me via Amion (for emergencies) or Securechat (non urgent messages)  Between 7 pm - 7 am I am not available, please contact night coverage MD/APP via Amion
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
## 2024-01-30 NOTE — Progress Notes (Signed)
 PHARMACY - ANTICOAGULATION CONSULT NOTE  Pharmacy Consult for heparin  infusion Indication: atrial fibrillation  Allergies  Allergen Reactions   Norvasc  [Amlodipine ] Swelling and Other (See Comments)    Excessive BLE swelling Skin discoloration, blotching   Jardiance  [Empagliflozin ] Itching and Other (See Comments)    Patient mentioned penile swelling, pain   Morphine And Codeine Itching    Opioid-induced pruritus    Patient Measurements: Height: 6' 1 (185.4 cm) Weight: 80.6 kg (177 lb 12.8 oz) IBW/kg (Calculated) : 79.9 HEPARIN  DW (KG): 72.8  Vital Signs: Temp: 98 F (36.7 C) (10/14 2000) Temp Source: Oral (10/14 2000) BP: 126/49 (10/14 2000) Pulse Rate: 89 (10/14 2000)  Labs: Recent Labs    01/28/24 0600 01/28/24 2256 01/29/24 0924 01/29/24 2036 01/30/24 0252 01/30/24 0639 01/30/24 2051  HGB 9.7*  --  10.6*  --  10.3*  --   --   HCT 30.6*  --  34.2*  --  32.8*  --   --   PLT 259  --  329  --  403*  --   --   APTT 37*   < > 41* 42*  --  51*  --   HEPARINUNFRC 0.38  --  0.33  --   --  0.25* 0.23*  CREATININE 1.77*  --  1.88*  --  2.02*  --   --    < > = values in this interval not displayed.    Estimated Creatinine Clearance: 37.9 mL/min (A) (by C-G formula based on SCr of 2.02 mg/dL (H)).   Assessment: 73 yo M presenting for esophageal rupture, now s/p esophageal stent placement on 10/6. PMH significant for HFrEF, Afib on Eliquis  PTA. Last dose of Eliquis  01/20/24, time unknown. Pharmacy consulted for heparin  management.  Heparin  level 0.23 (remains subtherapeutic) on infusion at 1550 units/hr. No issues with line or bleeding reported per RN.  Goal of Therapy:  Heparin  level 0.3-0.7 units/ml Monitor platelets by anticoagulation protocol: Yes   Plan:  Increase heparin  rate to 1700 units/hr Check heparin  level in 8 hours  Vito Ralph, PharmD, BCPS Please see amion for complete clinical pharmacist phone list 01/30/2024 10:21 PM

## 2024-01-30 NOTE — TOC Progression Note (Addendum)
 Transition of Care [facility]) - Progression Note    Patient Details  Name: Bijon Mineer MRN: [id number] Date of Birth: Jan 15, 1951  Transition of [facility]) CM/SW Contact  Justina Delcia Czar, RN Phone Number: [phone] 01/30/2024, 4:05 PM  Clinical Narrative:      Chart reviewed for discharge readiness, patient not medically stable for d/c continues on heparin  gtt, IV abx and TPN. [employer] rep, Pam RN is follow for possible Home IV. HH arranged with [facility] for [facility]. Will need HH orders with [facility]2F.     Inpatient CM/CSW will continue to monitor pt's advancement through interdisciplinary progression rounds.    If new pt transition needs arise, MD please place a TOC consult.     Expected Discharge Plan: Home/Self Care Barriers to Discharge: Continued Medical Work up    Expected Discharge Plan and Services   Discharge Planning Services: CM Consult   Living arrangements for the past 2 months: Single Family Home                   Social Drivers of Health (SDOH) Interventions SDOH Screenings   Food Insecurity: No Food Insecurity (01/22/2024)  Housing: High Risk (01/22/2024)  Transportation Needs: No Transportation Needs (01/22/2024)  Utilities: Not At Risk (01/22/2024)  Depression (PHQ2-9): Low Risk  (12/01/2020)  Social Connections: Socially Isolated (01/22/2024)  Tobacco Use: Low Risk  (01/22/2024)    Readmission Risk Interventions    06/27/2023    1:52 PM 10/14/2022   11:29 AM  Readmission Risk Prevention Plan  Post Dischage Appt  Complete  Medication Screening  Complete  Transportation Screening Complete Complete  HRI or Home Care Consult Complete   Social Work Consult for Recovery Care Planning/Counseling Complete   Palliative Care Screening Not Applicable   Medication Review Oceanographer) Referral to Pharmacy

## 2024-01-31 ENCOUNTER — Inpatient Hospital Stay (HOSPITAL_COMMUNITY)

## 2024-01-31 ENCOUNTER — Encounter (HOSPITAL_COMMUNITY)
Admission: EM | Disposition: A | Discharge status: Skilled Nursing Facility | Payer: Self-pay | Source: Home / Self Care | Attending: Family Medicine

## 2024-01-31 ENCOUNTER — Encounter (HOSPITAL_COMMUNITY): Payer: Self-pay | Admitting: Internal Medicine

## 2024-01-31 ENCOUNTER — Inpatient Hospital Stay (HOSPITAL_COMMUNITY): Admitting: Anesthesiology

## 2024-01-31 DIAGNOSIS — I517 Cardiomegaly: Secondary | ICD-10-CM | POA: Diagnosis not present

## 2024-01-31 DIAGNOSIS — N1832 Chronic kidney disease, stage 3b: Secondary | ICD-10-CM

## 2024-01-31 DIAGNOSIS — I130 Hypertensive heart and chronic kidney disease with heart failure and stage 1 through stage 4 chronic kidney disease, or unspecified chronic kidney disease: Secondary | ICD-10-CM

## 2024-01-31 DIAGNOSIS — J189 Pneumonia, unspecified organism: Secondary | ICD-10-CM | POA: Diagnosis not present

## 2024-01-31 DIAGNOSIS — T85528A Displacement of other gastrointestinal prosthetic devices, implants and grafts, initial encounter: Secondary | ICD-10-CM | POA: Diagnosis not present

## 2024-01-31 DIAGNOSIS — R918 Other nonspecific abnormal finding of lung field: Secondary | ICD-10-CM | POA: Diagnosis not present

## 2024-01-31 DIAGNOSIS — J90 Pleural effusion, not elsewhere classified: Secondary | ICD-10-CM | POA: Diagnosis not present

## 2024-01-31 DIAGNOSIS — I5033 Acute on chronic diastolic (congestive) heart failure: Secondary | ICD-10-CM

## 2024-01-31 DIAGNOSIS — Z9911 Dependence on respirator [ventilator] status: Secondary | ICD-10-CM

## 2024-01-31 DIAGNOSIS — R140 Abdominal distension (gaseous): Secondary | ICD-10-CM | POA: Diagnosis not present

## 2024-01-31 DIAGNOSIS — N179 Acute kidney failure, unspecified: Secondary | ICD-10-CM | POA: Diagnosis not present

## 2024-01-31 DIAGNOSIS — I480 Paroxysmal atrial fibrillation: Secondary | ICD-10-CM | POA: Diagnosis not present

## 2024-01-31 DIAGNOSIS — I5022 Chronic systolic (congestive) heart failure: Secondary | ICD-10-CM | POA: Diagnosis not present

## 2024-01-31 DIAGNOSIS — K223 Perforation of esophagus: Secondary | ICD-10-CM | POA: Diagnosis not present

## 2024-01-31 DIAGNOSIS — Z4682 Encounter for fitting and adjustment of non-vascular catheter: Secondary | ICD-10-CM | POA: Diagnosis not present

## 2024-01-31 DIAGNOSIS — I4891 Unspecified atrial fibrillation: Secondary | ICD-10-CM | POA: Diagnosis not present

## 2024-01-31 HISTORY — PX: ESOPHAGOGASTRODUODENOSCOPY: SHX5428

## 2024-01-31 LAB — MAGNESIUM
Magnesium: 2.6 mg/dL — ABNORMAL HIGH (ref 1.7–2.4)
Magnesium: 2.4 mg/dL (ref 1.7–2.4)

## 2024-01-31 LAB — COMPREHENSIVE METABOLIC PANEL WITH GFR
ALT: 48 U/L — ABNORMAL HIGH (ref 0–44)
AST: 40 U/L (ref 15–41)
Albumin: 1.7 g/dL — ABNORMAL LOW (ref 3.5–5.0)
Alkaline Phosphatase: 160 U/L — ABNORMAL HIGH (ref 38–126)
Anion gap: 12 (ref 5–15)
BUN: 92 mg/dL — ABNORMAL HIGH (ref 8–23)
CO2: 21 mmol/L — ABNORMAL LOW (ref 22–32)
Calcium: 8.3 mg/dL — ABNORMAL LOW (ref 8.9–10.3)
Chloride: 102 mmol/L (ref 98–111)
Creatinine, Ser: 2.34 mg/dL — ABNORMAL HIGH (ref 0.61–1.24)
GFR, Estimated: 29 mL/min — ABNORMAL LOW (ref >60)
Glucose, Bld: 153 mg/dL — ABNORMAL HIGH (ref 70–99)
Potassium: 4.3 mmol/L (ref 3.5–5.1)
Sodium: 135 mmol/L (ref 135–145)
Total Bilirubin: 1 mg/dL (ref 0.0–1.2)
Total Protein: 6.2 g/dL — ABNORMAL LOW (ref 6.5–8.1)

## 2024-01-31 LAB — POCT I-STAT 7, (LYTES, BLD GAS, ICA,H+H)
Acid-base deficit: 6 mmol/L — ABNORMAL HIGH (ref 0.0–2.0)
Bicarbonate: 20.1 mmol/L (ref 20.0–28.0)
Calcium, Ion: 1.23 mmol/L (ref 1.15–1.40)
HCT: 24 % — ABNORMAL LOW (ref 39.0–52.0)
Hemoglobin: 8.2 g/dL — ABNORMAL LOW (ref 13.0–17.0)
O2 Saturation: 97 %
Patient temperature: 97.9
Potassium: 4.6 mmol/L (ref 3.5–5.1)
Sodium: 136 mmol/L (ref 135–145)
TCO2: 21 mmol/L — ABNORMAL LOW (ref 22–32)
pCO2 arterial: 39.9 mmHg (ref 32–48)
pH, Arterial: 7.307 — ABNORMAL LOW (ref 7.35–7.45)
pO2, Arterial: 98 mmHg (ref 83–108)

## 2024-01-31 LAB — RENAL FUNCTION PANEL
Albumin: 1.6 g/dL — ABNORMAL LOW (ref 3.5–5.0)
Anion gap: 12 (ref 5–15)
BUN: 93 mg/dL — ABNORMAL HIGH (ref 8–23)
CO2: 21 mmol/L — ABNORMAL LOW (ref 22–32)
Calcium: 8.2 mg/dL — ABNORMAL LOW (ref 8.9–10.3)
Chloride: 100 mmol/L (ref 98–111)
Creatinine, Ser: 2.29 mg/dL — ABNORMAL HIGH (ref 0.61–1.24)
GFR, Estimated: 30 mL/min — ABNORMAL LOW (ref >60)
Glucose, Bld: 147 mg/dL — ABNORMAL HIGH (ref 70–99)
Phosphorus: 4.4 mg/dL (ref 2.5–4.6)
Potassium: 4.2 mmol/L (ref 3.5–5.1)
Sodium: 133 mmol/L — ABNORMAL LOW (ref 135–145)

## 2024-01-31 LAB — CBC
HCT: 31.6 % — ABNORMAL LOW (ref 39.0–52.0)
HCT: 27.5 % — ABNORMAL LOW (ref 39.0–52.0)
Hemoglobin: 9.9 g/dL — ABNORMAL LOW (ref 13.0–17.0)
Hemoglobin: 8.5 g/dL — ABNORMAL LOW (ref 13.0–17.0)
MCH: 27.5 pg (ref 26.0–34.0)
MCH: 27.7 pg (ref 26.0–34.0)
MCHC: 31.3 g/dL (ref 30.0–36.0)
MCHC: 30.9 g/dL (ref 30.0–36.0)
MCV: 87.8 fL (ref 80.0–100.0)
MCV: 89.6 fL (ref 80.0–100.0)
Platelets: 470 K/uL — ABNORMAL HIGH (ref 150–400)
Platelets: 442 K/uL — ABNORMAL HIGH (ref 150–400)
RBC: 3.6 MIL/uL — ABNORMAL LOW (ref 4.22–5.81)
RBC: 3.07 MIL/uL — ABNORMAL LOW (ref 4.22–5.81)
RDW: 17.7 % — ABNORMAL HIGH (ref 11.5–15.5)
RDW: 17.8 % — ABNORMAL HIGH (ref 11.5–15.5)
WBC: 21.6 K/uL — ABNORMAL HIGH (ref 4.0–10.5)
WBC: 20.7 K/uL — ABNORMAL HIGH (ref 4.0–10.5)
nRBC: 0 % (ref 0.0–0.2)
nRBC: 0 % (ref 0.0–0.2)

## 2024-01-31 LAB — HEPARIN LEVEL (UNFRACTIONATED)
Heparin Unfractionated: 0.26 [IU]/mL — ABNORMAL LOW (ref 0.30–0.70)
Heparin Unfractionated: <0.1 [IU]/mL — ABNORMAL LOW (ref 0.30–0.70)

## 2024-01-31 LAB — BASIC METABOLIC PANEL WITH GFR
Anion gap: 10 (ref 5–15)
BUN: 99 mg/dL — ABNORMAL HIGH (ref 8–23)
CO2: 21 mmol/L — ABNORMAL LOW (ref 22–32)
Calcium: 8 mg/dL — ABNORMAL LOW (ref 8.9–10.3)
Chloride: 103 mmol/L (ref 98–111)
Creatinine, Ser: 2.5 mg/dL — ABNORMAL HIGH (ref 0.61–1.24)
GFR, Estimated: 27 mL/min — ABNORMAL LOW (ref >60)
Glucose, Bld: 164 mg/dL — ABNORMAL HIGH (ref 70–99)
Potassium: 4.4 mmol/L (ref 3.5–5.1)
Sodium: 134 mmol/L — ABNORMAL LOW (ref 135–145)

## 2024-01-31 LAB — PHOSPHORUS: Phosphorus: 5.8 mg/dL — ABNORMAL HIGH (ref 2.5–4.6)

## 2024-01-31 LAB — GLUCOSE, CAPILLARY
Glucose-Capillary: 124 mg/dL — ABNORMAL HIGH (ref 70–99)
Glucose-Capillary: 133 mg/dL — ABNORMAL HIGH (ref 70–99)
Glucose-Capillary: 154 mg/dL — ABNORMAL HIGH (ref 70–99)
Glucose-Capillary: 184 mg/dL — ABNORMAL HIGH (ref 70–99)

## 2024-01-31 LAB — COOXEMETRY PANEL
Carboxyhemoglobin: 2.2 % — ABNORMAL HIGH (ref 0.5–1.5)
Methemoglobin: 1.3 % (ref 0.0–1.5)
O2 Saturation: 73.9 %
Total hemoglobin: 8.9 g/dL — ABNORMAL LOW (ref 12.0–16.0)

## 2024-01-31 LAB — PROCALCITONIN: Procalcitonin: 1.28 ng/mL

## 2024-01-31 LAB — LACTIC ACID, PLASMA: Lactic Acid, Venous: 1.8 mmol/L (ref 0.5–1.9)

## 2024-01-31 LAB — AMMONIA: Ammonia: 22 umol/L (ref 9–35)

## 2024-01-31 MED ORDER — ONDANSETRON HCL 4 MG/2ML IJ SOLN
INTRAMUSCULAR | Status: AC
  Filled 2024-01-31: qty 2

## 2024-01-31 MED ORDER — PROPOFOL 10 MG/ML IV BOLUS
INTRAVENOUS | Status: DC | PRN
  Administered 2024-01-31: 100 mg via INTRAVENOUS

## 2024-01-31 MED ORDER — SUCCINYLCHOLINE CHLORIDE 200 MG/10ML IV SOSY
PREFILLED_SYRINGE | INTRAVENOUS | Status: AC
Start: 2024-01-31 — End: 2024-01-31
  Filled 2024-01-31: qty 10

## 2024-01-31 MED ORDER — SODIUM CHLORIDE 0.9 % IV SOLN: INTRAVENOUS | Status: DC

## 2024-01-31 MED ORDER — ROCURONIUM BROMIDE 10 MG/ML (PF) SYRINGE
PREFILLED_SYRINGE | INTRAVENOUS | Status: DC | PRN
  Administered 2024-01-31: 20 mg via INTRAVENOUS

## 2024-01-31 MED ORDER — FENTANYL 2500MCG IN NS 250ML (10MCG/ML) PREMIX INFUSION
INTRAVENOUS | Status: AC
  Administered 2024-01-31: 25 ug/h via INTRAVENOUS
  Filled 2024-01-31: qty 250

## 2024-01-31 MED ORDER — SUGAMMADEX SODIUM 200 MG/2ML IV SOLN
INTRAVENOUS | Status: DC | PRN
  Administered 2024-01-31: 200 mg via INTRAVENOUS

## 2024-01-31 MED ORDER — VASOPRESSIN 20 UNIT/ML IV SOLN
INTRAVENOUS | Status: AC
  Filled 2024-01-31: qty 1

## 2024-01-31 MED ORDER — LACTATED RINGERS IV SOLN: INTRAVENOUS | Status: DC | PRN

## 2024-01-31 MED ORDER — DOCUSATE SODIUM 50 MG/5ML PO LIQD: 100.0000 mg | Freq: Two times a day (BID) | ORAL | Status: DC

## 2024-01-31 MED ORDER — SODIUM CHLORIDE (PF) 0.9 % IJ SOLN
INTRAMUSCULAR | Status: AC
  Filled 2024-01-31: qty 20

## 2024-01-31 MED ORDER — FLUCONAZOLE IN SODIUM CHLORIDE 200-0.9 MG/100ML-% IV SOLN
200.0000 mg | Freq: Every day | INTRAVENOUS | Status: DC
  Administered 2024-02-01 – 2024-02-20 (×20): 200 mg via INTRAVENOUS
  Filled 2024-01-31 (×21): qty 100

## 2024-01-31 MED ORDER — METHOCARBAMOL 1000 MG/10ML IJ SOLN
500.0000 mg | Freq: Three times a day (TID) | INTRAMUSCULAR | Status: DC
  Filled 2024-01-31 (×2): qty 5

## 2024-01-31 MED ORDER — TRAVASOL 10 % IV SOLN
INTRAVENOUS | Status: AC
  Filled 2024-01-31: qty 1108.1

## 2024-01-31 MED ORDER — FENTANYL CITRATE (PF) 250 MCG/5ML IJ SOLN
INTRAMUSCULAR | Status: AC
  Filled 2024-01-31: qty 5

## 2024-01-31 MED ORDER — CHLORHEXIDINE GLUCONATE 0.12 % MT SOLN
OROMUCOSAL | Status: AC
  Administered 2024-01-31: 15 mL via OROMUCOSAL
  Filled 2024-01-31: qty 15

## 2024-01-31 MED ORDER — NOREPINEPHRINE 4 MG/250ML-% IV SOLN
INTRAVENOUS | Status: AC
  Filled 2024-01-31: qty 250

## 2024-01-31 MED ORDER — CHLORHEXIDINE GLUCONATE 0.12 % MT SOLN: 15.0000 mL | Freq: Once | OROMUCOSAL | Status: AC

## 2024-01-31 MED ORDER — HYDRALAZINE HCL 20 MG/ML IJ SOLN
10.0000 mg | Freq: Four times a day (QID) | INTRAMUSCULAR | Status: DC | PRN
  Administered 2024-02-02 – 2024-02-21 (×5): 10 mg via INTRAVENOUS
  Filled 2024-01-31 (×6): qty 1

## 2024-01-31 MED ORDER — 0.9 % SODIUM CHLORIDE (POUR BTL) OPTIME
TOPICAL | Status: DC | PRN
  Administered 2024-01-31: 1000 mL

## 2024-01-31 MED ORDER — IOHEXOL 300 MG/ML  SOLN
25.0000 mL | Freq: Once | INTRAMUSCULAR | Status: AC | PRN
  Administered 2024-01-31: 25 mL via ORAL

## 2024-01-31 MED ORDER — PHENYLEPHRINE 80 MCG/ML (10ML) SYRINGE FOR IV PUSH (FOR BLOOD PRESSURE SUPPORT)
PREFILLED_SYRINGE | INTRAVENOUS | Status: AC
  Filled 2024-01-31: qty 10

## 2024-01-31 MED ORDER — FENTANYL 2500MCG IN NS 250ML (10MCG/ML) PREMIX INFUSION: 0.0000 ug/h | INTRAVENOUS | Status: DC

## 2024-01-31 MED ORDER — SODIUM CHLORIDE 0.9 % IV SOLN: 250.0000 mL | INTRAVENOUS | Status: AC

## 2024-01-31 MED ORDER — ROCURONIUM BROMIDE 10 MG/ML (PF) SYRINGE
PREFILLED_SYRINGE | INTRAVENOUS | Status: AC
  Filled 2024-01-31: qty 10

## 2024-01-31 MED ORDER — ONDANSETRON HCL 4 MG/2ML IJ SOLN
INTRAMUSCULAR | Status: DC | PRN
  Administered 2024-01-31: 4 mg via INTRAVENOUS

## 2024-01-31 MED ORDER — PROPOFOL 1000 MG/100ML IV EMUL
0.0000 ug/kg/min | INTRAVENOUS | Status: DC
  Administered 2024-01-31: 25 ug/kg/min via INTRAVENOUS
  Administered 2024-01-31: 45 ug/kg/min via INTRAVENOUS
  Administered 2024-01-31: 25 ug/kg/min via INTRAVENOUS
  Administered 2024-02-01 (×2): 55 ug/kg/min via INTRAVENOUS
  Filled 2024-01-31 (×3): qty 100

## 2024-01-31 MED ORDER — FENTANYL BOLUS VIA INFUSION
25.0000 ug | INTRAVENOUS | Status: DC | PRN
  Administered 2024-02-01 (×3): 25 ug via INTRAVENOUS
  Administered 2024-02-01: 50 ug via INTRAVENOUS

## 2024-01-31 MED ORDER — ORAL CARE MOUTH RINSE: 15.0000 mL | Freq: Once | OROMUCOSAL | Status: AC

## 2024-01-31 MED ORDER — FAMOTIDINE 20 MG PO TABS: 20.0000 mg | ORAL_TABLET | Freq: Two times a day (BID) | ORAL | Status: DC

## 2024-01-31 MED ORDER — NOREPINEPHRINE 4 MG/250ML-% IV SOLN
0.0000 ug/min | INTRAVENOUS | Status: DC
  Administered 2024-01-31: 2 ug/min via INTRAVENOUS
  Administered 2024-02-01: 6 ug/min via INTRAVENOUS
  Filled 2024-01-31 (×2): qty 250

## 2024-01-31 MED ORDER — VASOPRESSIN 20 UNIT/ML IV SOLN
INTRAVENOUS | Status: DC | PRN
  Administered 2024-01-31: 1 [IU] via INTRAVENOUS

## 2024-01-31 MED ORDER — FENTANYL CITRATE (PF) 50 MCG/ML IJ SOSY: 25.0000 ug | PREFILLED_SYRINGE | Freq: Once | INTRAMUSCULAR | Status: DC

## 2024-01-31 MED ORDER — PROPOFOL 500 MG/50ML IV EMUL
INTRAVENOUS | Status: DC | PRN
  Administered 2024-01-31: 35 ug/kg/min via INTRAVENOUS

## 2024-01-31 MED ORDER — SUCCINYLCHOLINE CHLORIDE 200 MG/10ML IV SOSY
PREFILLED_SYRINGE | INTRAVENOUS | Status: DC | PRN
  Administered 2024-01-31: 120 mg via INTRAVENOUS

## 2024-01-31 MED ORDER — POLYETHYLENE GLYCOL 3350 17 G PO PACK: 17.0000 g | PACK | Freq: Every day | ORAL | Status: DC

## 2024-01-31 MED ORDER — AMIODARONE HCL IN DEXTROSE 360-4.14 MG/200ML-% IV SOLN
30.0000 mg/h | INTRAVENOUS | Status: DC
  Administered 2024-01-31 – 2024-02-03 (×6): 30 mg/h via INTRAVENOUS
  Filled 2024-01-31 (×6): qty 200

## 2024-01-31 MED ORDER — HEPARIN (PORCINE) 25000 UT/250ML-% IV SOLN
2150.0000 [IU]/h | INTRAVENOUS | Status: DC
  Administered 2024-01-31: 1000 [IU]/h via INTRAVENOUS
  Administered 2024-02-01: 1300 [IU]/h via INTRAVENOUS
  Administered 2024-02-02: 1700 [IU]/h via INTRAVENOUS
  Administered 2024-02-03: 1900 [IU]/h via INTRAVENOUS
  Administered 2024-02-03: 2000 [IU]/h via INTRAVENOUS
  Administered 2024-02-04: 2150 [IU]/h via INTRAVENOUS
  Filled 2024-01-31 (×5): qty 250

## 2024-01-31 MED ORDER — PHENYLEPHRINE 80 MCG/ML (10ML) SYRINGE FOR IV PUSH (FOR BLOOD PRESSURE SUPPORT)
PREFILLED_SYRINGE | INTRAVENOUS | Status: DC | PRN
  Administered 2024-01-31: 160 ug via INTRAVENOUS
  Administered 2024-01-31: 240 ug via INTRAVENOUS
  Administered 2024-01-31: 160 ug via INTRAVENOUS

## 2024-01-31 MED ORDER — LIDOCAINE 2% (20 MG/ML) 5 ML SYRINGE
INTRAMUSCULAR | Status: DC | PRN
  Administered 2024-01-31: 80 mg via INTRAVENOUS

## 2024-01-31 MED ORDER — LIDOCAINE 2% (20 MG/ML) 5 ML SYRINGE
INTRAMUSCULAR | Status: AC
  Filled 2024-01-31: qty 5

## 2024-01-31 NOTE — Transfer of Care (Signed)
 Immediate Anesthesia Transfer of Care Note  Patient: Billy [name]  Procedure(s) Performed: EGD (ESOPHAGOGASTRODUODENOSCOPY)  Patient Location: ICU  Anesthesia Type:General  Level of Consciousness: sedated and Patient remains intubated per anesthesia plan  Airway & Oxygen  Therapy: Patient remains intubated per anesthesia plan and Patient placed on Ventilator (see vital sign flow sheet for setting)  Post-op Assessment: Report given to RN and Post -op Vital signs reviewed and stable  Post vital signs: Reviewed and stable  Last Vitals:  Vitals Value Taken Time  BP 122/49 01/31/24 15:39  Temp    Pulse 76 01/31/24 15:43  Resp 18 01/31/24 15:42  SpO2 99 % 01/31/24 15:43  Vitals shown include unfiled device data.  Last Pain:  Vitals:   01/31/24 1246  TempSrc: Oral  PainSc:       Patients Stated Pain Goal: 0 (01/31/24 0459)  Complications: No notable events documented.

## 2024-01-31 NOTE — Progress Notes (Signed)
 PT Cancellation Note  Patient Details Name: Billy [name] MRN: [id number] DOB: [birth year]   Cancelled Treatment:    Reason Eval/Treat Not Completed: (P) Patient at procedure or test/unavailable (off unit for imaging @ Diagnostic Rad dept.) Will continue efforts per PT plan of care as schedule permits.   Hannahgrace Lalli M Bashar Milam 01/31/2024, 10:15 AM

## 2024-01-31 NOTE — Anesthesia Postprocedure Evaluation (Signed)
 Anesthesia Post Note  Patient: Billy [name]  Procedure(s) Performed: EGD (ESOPHAGOGASTRODUODENOSCOPY)     Patient location during evaluation: SICU Anesthesia Type: General Level of consciousness: sedated Pain management: pain level controlled Vital Signs Assessment: post-procedure vital signs reviewed and stable Respiratory status: patient remains intubated per anesthesia plan Cardiovascular status: stable Postop Assessment: no apparent nausea or vomiting Anesthetic complications: no Comments: See MDA quick note at 1518 to address unplanned ICU admission with acute respiratory failure requiring continued mechanical ventilation.   No notable events documented.  Last Vitals:  Vitals:   01/31/24 1246 01/31/24 1550  BP: (!) 144/64   Pulse: 91   Resp: 18   Temp: 37.2 C   SpO2: 93% 100%    Last Pain:  Vitals:   01/31/24 1246  TempSrc: Oral  PainSc:                  Garnette FORBES Skillern

## 2024-01-31 NOTE — Progress Notes (Addendum)
 PROGRESS NOTE  Billy [name] FMW:[id number] DOB: June 04, 1950 DOA: 01/21/2024 PCP: Seabron Lenis, MD   LOS: 10 days   Brief Narrative / Interim history: Billy [name] is a 73 y.o. male with a history of HFrEF, AFib, CAD, PE complicated by cardiogenic shock who presented to the ED on 01/21/2024 with several days of vomiting followed by an episode of severe tearing epigastric pain. He was found to have an esophageal tear. GI and cardiothoracic surgery were consulted, took for EGD with stent placement 10/7.  10/15, found to have a another esophageal leak.  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 Shyrl 10/7.  Repeat swallow study  now shows another leak -Change antibiotics to IV plan for total of 6 weeks of Augmentin as well as fluconazole - Per cardiothoracic surgery  Possible ileus - Continue TPN  AKI on CKD3b -baseline creatinine in the low 2s-monitor for dehydration  PAF, with RVR, history of DVT/PE -discussed with CT surgery, back on heparin  - On oral Amio but since he is n.p.o. may need to be changed to IV  Leukocytosis- -Suspect due to esophageal leak  Acute on chronic HFrEF -appreciate heart failure follow-up, getting diuresed, further management per CHF team.   -Will need to be transition to IV diuretics while n.p.o.  Hyperkalemia -resolved  Scheduled Meds:  amiodarone   400 mg Oral BID   atorvastatin   80 mg Oral Daily   bisacodyl   10 mg Rectal Q0600   Chlorhexidine  Gluconate Cloth  6 each Topical Daily   feeding supplement  237 mL Oral BID BM   hydrALAZINE   25 mg Oral Q8H   levalbuterol  0.63 mg Nebulization Q6H   methocarbamol  (ROBAXIN ) injection  500 mg Intravenous Q8H   pantoprazole  (PROTONIX ) IV  40 mg Intravenous QHS   sodium chloride  flush  10-40 mL Intracatheter Q12H   spironolactone   25 mg Oral Daily   Continuous Infusions:  calcium   gluconate     famotidine (PEPCID) IV Stopped (01/30/24 2144)   fluconazole (DIFLUCAN) IV     heparin  1,850 Units/hr (01/31/24 1113)   piperacillin -tazobactam (ZOSYN )  IV 3.375 g (01/31/24 0504)   TPN ADULT (ION) 80 mL/hr at 01/30/24 2326   PRN Meds:.guaiFENesin -dextromethorphan, HYDROmorphone  (DILAUDID ) injection, ondansetron  (ZOFRAN ) IV, mouth rinse, oxyCODONE , simethicone , sodium chloride  flush  Current Outpatient Medications  Medication Instructions   apixaban  (ELIQUIS ) 5 mg, Oral, 2 times daily   atorvastatin  (LIPITOR ) 80 mg, Oral, Daily   ezetimibe  (ZETIA ) 10 mg, Oral, Daily   ibuprofen (ADVIL) 400-600 mg, Oral, 2 times daily PRN   Multiple Vitamins-Minerals (MENS 50+ MULTIVITAMIN) TABS 1 tablet, Oral, Daily   sacubitril -valsartan  (ENTRESTO ) 97-103 MG 1 tablet, Oral, 2 times daily   torsemide  (DEMADEX ) 20 mg, Oral, Daily   Diet Orders (From admission, onward)     Start     Ordered   01/31/24 0736  Diet NPO time specified  Diet effective now        01/31/24 0736            DVT prophylaxis: SCDs Start: 01/21/24 2023   Lab Results  Component Value Date   PLT 470 (H) 01/31/2024      Code Status: Full Code  Family Communication: No family at bedside  Status is: Inpatient Remains inpatient appropriate because: Severity of illness    Level of care: Progressive  Consultants:  Cardiothoracic surgery Cardiology  Objective: Vitals:  01/30/24 2300 01/31/24 0247 01/31/24 0400 01/31/24 0756  BP: (!) 120/53   139/67  Pulse: 83   98  Resp: 19  20 20   Temp: 98 F (36.7 C)  98 F (36.7 C) 98.1 F (36.7 C)  TempSrc: Oral  Oral Oral  SpO2: 98% 95% 94% 96%  Weight:      Height:        Intake/Output Summary (Last 24 hours) at 01/31/2024 1120 Last data filed at 01/31/2024 0000 Gross per 24 hour  Intake 899.4 ml  Output 1300 ml  Net -400.6 ml   Wt Readings from Last 3 Encounters:  01/30/24 80.6 kg  12/19/23 75.7 kg  09/27/23 74.8 kg     Examination:   General: Appearance:    Well developed, well nourished male who appears uncomfortable   Diminished bowel sounds  Lungs:   Diminished, mild increased work of breathing  Heart:    Normal heart rate.   MS:   All extremities are intact.   Neurologic:   Awake, alert-withdrawn, irritable     Data Reviewed: I have independently reviewed following labs and imaging studies   CBC Recent Labs  Lab 01/27/24 0435 01/28/24 0600 01/29/24 0924 01/30/24 0252 01/31/24 0747  WBC 14.2* 14.9* 19.2* 22.0* 21.6*  HGB 10.2* 9.7* 10.6* 10.3* 9.9*  HCT 31.9* 30.6* 34.2* 32.8* 31.6*  PLT 229 259 329 403* 470*  MCV 86.9 87.2 87.7 88.4 87.8  MCH 27.8 27.6 27.2 27.8 27.5  MCHC 32.0 31.7 31.0 31.4 31.3  RDW 18.4* 18.7* 18.3* 18.2* 17.7*  LYMPHSABS  --   --  1.0  --   --   MONOABS  --   --  1.4*  --   --   EOSABS  --   --  0.2  --   --   BASOSABS  --   --  0.1  --   --     Recent Labs  Lab 01/25/24 0307 01/25/24 0855 01/26/24 0320 01/26/24 0831 01/27/24 0435 01/28/24 0600 01/29/24 0924 01/30/24 0252 01/30/24 2051 01/31/24 0747 01/31/24 0951  NA 139  --   --    < > 139 141 138 136  --  133* 135  K 3.3*   < > 3.3*   < > 3.3* 3.5 4.4 4.5  --  4.2 4.3  CL 106  --   --    < > 106 106 108 104  --  100 102  CO2 21*  --   --    < > 23 23 18* 19*  --  21* 21*  GLUCOSE 167*  --   --    < > 175* 155* 147* 123*  --  147* 153*  BUN 54*  --   --    < > 54* 55* 62* 77*  --  93* 92*  CREATININE 2.10*  --   --    < > 1.80* 1.77* 1.88* 2.02*  --  2.29* 2.34*  CALCIUM  8.6*  --   --    < > 8.4* 8.3* 8.6* 8.6*  --  8.2* 8.3*  AST 18  --   --   --  34  --  32 42*  --   --  40  ALT 17  --   --   --  30  --  39 47*  --   --  48*  ALKPHOS 45  --   --   --  94  --  127* 156*  --   --  160*  BILITOT 1.3*  --   --   --  0.8  --  1.1 1.1  --   --  1.0  ALBUMIN  2.2*  --   --   --  1.8*  --  1.9* 1.9*  --  1.6* 1.7*  MG 2.0  --  2.0  --  2.1  --  2.1 2.5*  --   --  2.6*  PROCALCITON  --   --   --    --   --   --   --   --  1.32  --   --   LATICACIDVEN  --   --   --   --   --   --   --   --   --   --  1.8  AMMONIA  --   --   --   --   --   --   --   --   --   --  22   < > = values in this interval not displayed.    ------------------------------------------------------------------------------------------------------------------ Recent Labs    01/29/24 0924  TRIG 65     Lab Results  Component Value Date   HGBA1C 6.0 (H) 01/23/2024   ------------------------------------------------------------------------------------------------------------------ No results for input(s): TSH, T4TOTAL, T3FREE, THYROIDAB in the last 72 hours.  Invalid input(s): FREET3  Cardiac Enzymes No results for input(s): CKMB, TROPONINI, MYOGLOBIN in the last 168 hours.  Invalid input(s): CK ------------------------------------------------------------------------------------------------------------------    Component Value Date/Time   BNP 646.4 (H) 06/27/2023 1116    CBG: Recent Labs  Lab 01/30/24 0005 01/30/24 0631 01/30/24 1146 01/30/24 1835 01/31/24 0028  GLUCAP 132* 155* 145* 139* 124*    Recent Results (from the past 240 hours)  MRSA Next Gen by PCR, Nasal     Status: Abnormal   Collection Time: 01/21/24  9:27 PM   Specimen: Nasal Mucosa; Nasal Swab  Result Value Ref Range Status   MRSA by PCR Next Gen DETECTED (A) NOT DETECTED Final    Comment: RESULT CALLED TO, READ BACK BY AND VERIFIED WITH: J MCBRIDE RN 01/21/2024 @ 2334  (NOTE) The GeneXpert MRSA Assay (FDA approved for NASAL specimens only), is one component of a comprehensive MRSA colonization surveillance program. It is not intended to diagnose MRSA infection nor to guide or monitor treatment for MRSA infections. Test performance is not FDA approved in patients less than 73 years old. Performed at [facility], [address] [place], [place] [address]      Radiology Studies: DG Abd 1  View Result Date: 01/30/2024 EXAM: 1 VIEW XRAY OF THE ABDOMEN 01/30/2024 03:04:00 PM COMPARISON: 06/26/2023 CLINICAL HISTORY: ileus FINDINGS: LINES, TUBES AND DEVICES: New esophageal stent projecting over the stomach. IVC filter in place. BOWEL: Gas-filled loops of small bowel and colon without pathologic dilation, suggestive of ileus. Contrast is noted in the colon from a recent esophagram. SOFT TISSUES: No opaque urinary calculi. Small bilateral pleural effusions. BONES: No acute osseous abnormality. IMPRESSION: 1. Gas-filled loops of small bowel and colon without pathologic dilation, suggestive of ileus. 2. Small bilateral pleural effusions. Electronically signed by: Oneil Devonshire MD 01/30/2024 03:50 PM EDT RP Workstation: MYRTICE Harlene Bowl DO [facility]  Between 7 am - 7 pm I am available, please contact me via Amion (for emergencies) or Securechat (non urgent messages)  Between 7 pm - 7 am I am not available, please contact night coverage MD/APP via Amion

## 2024-01-31 NOTE — Anesthesia Preprocedure Evaluation (Addendum)
 Anesthesia Evaluation  Patient identified by MRN, date of birth, ID band Patient awake  General Assessment Comment:esophageal perforation  Reviewed: Allergy & Precautions, NPO status , Patient's Chart, lab work & pertinent test results  History of Anesthesia Complications (+) PONV and history of anesthetic complications  Airway Mallampati: III  TM Distance: >3 FB Neck ROM: Full    Dental  (+) Teeth Intact, Dental Advisory Given   Pulmonary neg pulmonary ROS   Pulmonary exam normal breath sounds clear to auscultation       Cardiovascular hypertension, Pt. on medications + Past MI, +CHF and + DVT  + dysrhythmias Atrial Fibrillation  Rhythm:Irregular Rate:Abnormal     Neuro/Psych TIA Neuromuscular disease  negative psych ROS   GI/Hepatic negative GI ROS, Neg liver ROS,,,  Endo/Other  negative endocrine ROS    Renal/GU Renal InsufficiencyRenal disease     Musculoskeletal negative musculoskeletal ROS (+)    Abdominal   Peds  Hematology  (+) Blood dyscrasia (Eliquis ), anemia   Anesthesia Other Findings Day of surgery medications reviewed with the patient.  Reproductive/Obstetrics                              Anesthesia Physical Anesthesia Plan  ASA: 3  Anesthesia Plan: General   Post-op Pain Management: Ofirmev  IV (intra-op)*   Induction: Intravenous  PONV Risk Score and Plan: 3  Airway Management Planned: Oral ETT  Additional Equipment:   Intra-op Plan:   Post-operative Plan: Extubation in OR  Informed Consent: I have reviewed the patients History and Physical, chart, labs and discussed the procedure including the risks, benefits and alternatives for the proposed anesthesia with the patient or authorized representative who has indicated his/her understanding and acceptance.     Dental advisory given  Plan Discussed with: CRNA  Anesthesia Plan Comments:           Anesthesia Quick Evaluation

## 2024-01-31 NOTE — Progress Notes (Signed)
 OT Cancellation Note  Patient Details Name: Billy [name] MRN: [id number] DOB: Sep 21, 1950   Cancelled Treatment:    Reason Eval/Treat Not Completed: Patient at procedure or test/ unavailable. Pt found to have another esophageal leak, scheduled for OR today for stent reposition. Will follow up post-op.   Jairy Angulo C, OT  Acute Rehabilitation Services Office [phone] Secure chat preferred   Adrianne GORMAN Savers 01/31/2024, 12:34 PM

## 2024-01-31 NOTE — Progress Notes (Addendum)
 PHARMACY - ANTICOAGULATION CONSULT NOTE  Pharmacy Consult for heparin  infusion Indication: atrial fibrillation  Allergies  Allergen Reactions   Norvasc  [Amlodipine ] Swelling and Other (See Comments)    Excessive BLE swelling Skin discoloration, blotching   Jardiance  [Empagliflozin ] Itching and Other (See Comments)    Patient mentioned penile swelling, pain   Morphine And Codeine Itching    Opioid-induced pruritus    Patient Measurements: Height: 6' 1 (185.4 cm) Weight: 80.6 kg (177 lb 12.8 oz) IBW/kg (Calculated) : 79.9 HEPARIN  DW (KG): 72.8  Vital Signs: Temp: 98.1 F (36.7 C) (10/15 0756) Temp Source: Oral (10/15 0756) BP: 139/67 (10/15 0756) Pulse Rate: 98 (10/15 0756)  Labs: Recent Labs    01/29/24 0924 01/29/24 2036 01/30/24 0252 01/30/24 0639 01/30/24 2051 01/31/24 0747  HGB 10.6*  --  10.3*  --   --  9.9*  HCT 34.2*  --  32.8*  --   --  31.6*  PLT 329  --  403*  --   --  470*  APTT 41* 42*  --  51*  --   --   HEPARINUNFRC 0.33  --   --  0.25* 0.23* 0.26*  CREATININE 1.88*  --  2.02*  --   --  2.29*    Estimated Creatinine Clearance: 33.4 mL/min (A) (by C-G formula based on SCr of 2.29 mg/dL (H)).   Assessment: 73 yo M presenting for esophageal rupture, now s/p esophageal stent placement on 10/6. PMH significant for HFrEF, Afib on Eliquis  PTA. Last dose of Eliquis  01/20/24, time unknown. Pharmacy consulted for heparin  management.  01/31/24 AM: Heparin  level 0.26, subtherapeutic on heparin  1700 units/hr. No issues with infusion running or signs of bleeding per RN. CBC stable (Hgb 9.9, PLT 470).    Goal of Therapy:  Heparin  level 0.3-0.7 units/ml Monitor platelets by anticoagulation protocol: Yes   Plan:  Increase heparin  rate to 1850 units/hr Check heparin  level in 8 hours Monitor heparin  level, CBC, and s/sx of bleeding daily  Morna Breach, PharmD PGY2 Cardiology Pharmacy Resident 01/31/2024 8:41 AM

## 2024-01-31 NOTE — Progress Notes (Signed)
 9 Days Post-Op Procedure(s) (LRB): EGD (ESOPHAGOGASTRODUODENOSCOPY) WITH STENT PLACEMENT (N/A) Subjective: Appears more lethargic and diaphoretic  Objective: Vital signs in last 24 hours: Temp:  [97.9 F (36.6 C)-99.3 F (37.4 C)] 98 F (36.7 C) (10/15 0400) Pulse Rate:  [77-100] 83 (10/14 2300) Cardiac Rhythm: Atrial fibrillation (10/14 2000) Resp:  [15-20] 20 (10/15 0400) BP: (120-174)/(49-65) 120/53 (10/14 2300) SpO2:  [93 %-98 %] 94 % (10/15 0400)  Hemodynamic parameters for last 24 hours: CVP:  [8 mmHg-12 mmHg] 8 mmHg  Intake/Output from previous day: 10/14 0701 - 10/15 0700 In: 899.4 [I.V.:749.6; IV Piggyback:149.8] Out: 1300 [Urine:1300] Intake/Output this shift: No intake/output data recorded.  General appearance: cooperative, distracted, fatigued, and no distress Heart: irregularly irregular rhythm Lungs: some scattered crackles Abdomen: + distension, High pitched BS, no guarding or rebound  Lab Results: Recent Labs    01/29/24 0924 01/30/24 0252  WBC 19.2* 22.0*  HGB 10.6* 10.3*  HCT 34.2* 32.8*  PLT 329 403*   BMET:  Recent Labs    01/29/24 0924 01/30/24 0252  NA 138 136  K 4.4 4.5  CL 108 104  CO2 18* 19*  GLUCOSE 147* 123*  BUN 62* 77*  CREATININE 1.88* 2.02*  CALCIUM  8.6* 8.6*    PT/INR: No results for input(s): LABPROT, INR in the last 72 hours. ABG    Component Value Date/Time   PHART 7.395 07/05/2023 1646   HCO3 20.1 07/05/2023 1646   TCO2 23 01/21/2024 1500   ACIDBASEDEF 4.0 (H) 07/05/2023 1646   O2SAT 61.7 01/26/2024 1023   CBG (last 3)  Recent Labs    01/30/24 1146 01/30/24 1835 01/31/24 0028  GLUCAP 145* 139* 124*    Meds Scheduled Meds:  amiodarone   400 mg Oral BID   atorvastatin   80 mg Oral Daily   bisacodyl   10 mg Rectal Q0600   Chlorhexidine  Gluconate Cloth  6 each Topical Daily   feeding supplement  237 mL Oral BID BM   fluconazole  200 mg Oral Daily   hydrALAZINE   25 mg Oral Q8H   levalbuterol  0.63 mg  Nebulization Q6H   methocarbamol   500 mg Oral TID   multivitamin with minerals  1 tablet Oral Daily   pantoprazole  (PROTONIX ) IV  40 mg Intravenous QHS   sodium chloride  flush  10-40 mL Intracatheter Q12H   spironolactone   25 mg Oral Daily   Continuous Infusions:  calcium  gluconate     famotidine (PEPCID) IV Stopped (01/30/24 2144)   heparin  1,700 Units/hr (01/31/24 0327)   piperacillin -tazobactam (ZOSYN )  IV 3.375 g (01/31/24 0504)   TPN ADULT (ION) 80 mL/hr at 01/30/24 2326   PRN Meds:.benzonatate, guaiFENesin -dextromethorphan, HYDROmorphone  (DILAUDID ) injection, methocarbamol  (ROBAXIN ) injection, ondansetron  (ZOFRAN ) IV, mouth rinse, oxyCODONE , simethicone , sodium chloride  flush  Xrays DG Abd 1 View Result Date: 01/30/2024 EXAM: 1 VIEW XRAY OF THE ABDOMEN 01/30/2024 03:04:00 PM COMPARISON: 06/26/2023 CLINICAL HISTORY: ileus FINDINGS: LINES, TUBES AND DEVICES: New esophageal stent projecting over the stomach. IVC filter in place. BOWEL: Gas-filled loops of small bowel and colon without pathologic dilation, suggestive of ileus. Contrast is noted in the colon from a recent esophagram. SOFT TISSUES: No opaque urinary calculi. Small bilateral pleural effusions. BONES: No acute osseous abnormality. IMPRESSION: 1. Gas-filled loops of small bowel and colon without pathologic dilation, suggestive of ileus. 2. Small bilateral pleural effusions. Electronically signed by: Oneil Devonshire MD 01/30/2024 03:50 PM EDT RP Workstation: [id number]   DG CHEST PORT 1 VIEW Result Date: 01/30/2024 EXAM: 1 VIEW(S) XRAY OF THE  CHEST 01/30/2024 08:34:48 AM COMPARISON: CT 01/21/2024. CLINICAL HISTORY: Leukocytosis, SOB. FINDINGS: LINES, TUBES AND DEVICES: Right arm PICC line to the distal SVC. Implanted event monitor overlies the left chest. Interval placement of enteric stent across the GE junction, distal margin not visualized. LUNGS AND PLEURA: Persistent somewhat coarse interstitial and airspace opacities in the left  lower lung. Suspected pleural effusions left greater than right. No pulmonary edema. No pneumothorax. HEART AND MEDIASTINUM: Heart size upper limits normal. Aortic calcification. BONES AND SOFT TISSUES: No acute osseous abnormality. IMPRESSION: 1. Persistent interstitial and airspace opacities in the left lower lung with small pleural effusions, left greater than right. 2. Interval placement of an enteric stent traversing the gastroesophageal junction; distal extent not visualized. Electronically signed by: Katheleen Faes MD 01/30/2024 08:46 AM EDT RP Workstation: [id number]    Assessment/Plan: S/P Procedure(s) (LRB): EGD (ESOPHAGOGASTRODUODENOSCOPY) WITH STENT PLACEMENT (N/A)  1 afeb, VSS s BP variable 120's-170's 2 sats ok on 2 liters 3 with increase abd distension  will make NPO 4 will obtain swallow study and AXR's     LOS: 10 days    Brad Lieurance E Thelmer Legler PA-C 01/31/2024
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
## 2024-01-31 NOTE — Progress Notes (Signed)
 PHARMACY - TOTAL PARENTERAL NUTRITION CONSULT NOTE   Indication: intolerance to enteral feedings, esophageal perforation  Patient Measurements: Height: 6' 1 (185.4 cm) Weight: 80.6 kg (177 lb 12.8 oz) IBW/kg (Calculated) : 79.9 TPN AdjBW (KG): 72.8 Body mass index is 23.46 kg/m.  Assessment:  73 year old man with medical history significant for cardiac arrest earlier this year who was admitted after several days of nausea and vomiting followed by sudden onset severe tearing epigastric pain radiating to back and chest. 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 Pharmacy consulted to initiate TPN due to inability to tolerate enteral nutrition at this time.  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 PTA takes torsemide  20 mg PO daily, now held. Aggressive fluid resuscitation recommended per GI. Per HF, patient currently euvolemic but will hold diuretic for now. Due to CHF will target lower end of fluid range for TPN and supplement additional fluids per MD outside of TPN to prevent fluid overload.   Stent placed on 10/6, esophogram not showing any leak. Diet advanced to CLD.   Glucose / Insulin : A1C 6.0% (02/2023) ;  BGs <150, mSSI (6 u/24h)  Electrolytes: Na 135, CO2 21, CoCa 10.1, mg 2.6 (none in TPN), others wnl  Renal: Scr 2.34 up (bsl ~2), BUN 92 up  Hepatic: AST/ALT 42 / 47, Alk Phos 156, tbili  wnl, alb 1.7, TG 65 Intake / Output; MIVF:  furosemide  IV 80 x2; UOP 0.7 mL/kg/hr charted, LBM 10/13  GI Imaging: 10/15 Esoph XR: ip  10/14 KUB: Gas-filled loops of small bowel and colon suggestive of ileus 10/7 Esoph XR: No leak associated with stent coverage of esophageal perforation  10/06 Esoph XR:: distal esophageal perforation 10/05 CT: concerning for esophageal perforation, masslike area of soft tissue thickening adjacent to and surrounding the esophagus  GI Surgeries / Procedures:  10/6: esophageal stent placement   Central access:  01/22/24 TPN start date: 01/22/24  Nutritional Goals: Goal TPN rate is 82 mL/hr (provides 110 g of protein and 2099 kcals per day)  RD Assessment:  Estimated Needs Total Energy Estimated Needs: 2100-2300 kcals Total Protein Estimated Needs: 110-130 g Total Fluid Estimated Needs: 2L  Current Nutrition:  TPN 10/13- 10/14 DYS1: not taking any po  10/15: NPO d/t increased abd distension   Plan:  Continue TPN to goal 81 mL/hr (increased slightly to add Na) at 1800 to meet 100% total nutritional needs. Monitor volume status, low threshold to concentrate TPN given history of HF. Currently minimal free water  in TPN.  Electrolytes in TPN: increase Na 75 mEq/L, decrease K 10 mEq/L, decrease Ca 2 mEq/L, Mg 0 mEq/L, decrease Phos 3 mmol/L. Cl:Ac max acetate Remove standard MVI and trace elements to TPN, giving as po Stop SSI and decrease BG checks to q8hr  Add 10 units insulin  regular to TPN  Monitor TPN labs on Mon/Thurs, and PRN F/u PO intake, wean TPN as able   Thank you for allowing pharmacy to be a part of this patient's care.  Jinnie Door, PharmD, BCPS, BCCP Clinical Pharmacist  Please check AMION for all [facility] Pharmacy phone numbers After 10:00 PM, call Main Pharmacy [phone]

## 2024-01-31 NOTE — Anesthesia Procedure Notes (Signed)
 Procedure Name: Intubation Date/Time: 01/31/2024 2:23 PM  Performed by: Emmitt Millman, CRNAPre-anesthesia Checklist: Patient identified, Emergency Drugs available, Suction available and Patient being monitored Patient Re-evaluated:Patient Re-evaluated prior to induction Oxygen  Delivery Method: Circle system utilized Preoxygenation: Pre-oxygenation with 100% oxygen  Induction Type: IV induction, Rapid sequence and Cricoid Pressure applied Laryngoscope Size: Glidescope and 4 Grade View: Grade I Tube type: Oral Tube size: 7.5 mm Number of attempts: 1 Airway Equipment and Method: Stylet Placement Confirmation: ETT inserted through vocal cords under direct vision, positive ETCO2 and breath sounds checked- equal and bilateral Secured at: 23 cm Tube secured with: Tape Dental Injury: Teeth and Oropharynx as per pre-operative assessment  Comments: Elective glidescope 2/2 patient condition

## 2024-01-31 NOTE — Progress Notes (Addendum)
 Patient ID: Billy [name], male   DOB: 1950/06/25, 73 y.o.   MRN: [id number]     Advanced Heart Failure Rounding Note  Cardiologist: Dorn Lesches, MD   Chief Complaint: S/P esophageal stent  Subjective:    Antibiotic regimen changed yesterday>>WBC 22K, PCT 1.32. CXR  LLL infiltrate. Abdominal x-ray suggestive of ileus>>concern for poor absorption Abx switched to IV Zosyn .   IV Lasix  given for volume overload>>1.3L in UOP. CVP 9   SCr continues to rise, 1.88>>2.02>>2.29 BUN 62>>77>>93  Looks and feels poorly. Diaphoretic. Slow to respond to questions. Says he feels tired and fatigue. WBC remains elevated at 22K.   1 BM occurrence yesterday.    Objective:    Weight Range: 80.6 kg Body mass index is 23.46 kg/m.   Vital Signs:   Temp:  [97.9 F (36.6 C)-98.1 F (36.7 C)] 98.1 F (36.7 C) (10/15 0756) Pulse Rate:  [79-100] 98 (10/15 0756) Resp:  [15-20] 20 (10/15 0756) BP: (120-174)/(49-67) 139/67 (10/15 0756) SpO2:  [93 %-98 %] 96 % (10/15 0756) Last BM Date : 01/30/24  Weight change: Filed Weights   01/28/24 0616 01/29/24 0526 01/30/24 0500  Weight: 77.8 kg 79.5 kg 80.6 kg   Intake/Output:  Intake/Output Summary (Last 24 hours) at 01/31/2024 0908 Last data filed at 01/31/2024 0000 Gross per 24 hour  Intake 899.4 ml  Output 1300 ml  Net -400.6 ml    Physical Exam   GENERAL: ill/fatigued appearing, diaphoretic. No respiratory difficulty  Lungs- diminished at bases + LUE expiratory wheezing  CARDIAC:  JVP: 10 cm         Irregularly irregular rhythm and rate. No edema, knees are cool to touch  ABDOMEN: distended, NT EXTREMITIES: no LEE, knees cool to touch + RUE PICC  NEUROLOGIC: moves all 4 ext w/o difficulty, slow to respond to questions. Affect flat   Telemetry   Afib 80s, personally reviewed   Labs    CBC Recent Labs    01/29/24 0924 01/30/24 0252 01/31/24 0747  WBC 19.2* 22.0* 21.6*  NEUTROABS 15.7*  --   --   HGB 10.6* 10.3* 9.9*  HCT 34.2*  32.8* 31.6*  MCV 87.7 88.4 87.8  PLT 329 403* 470*   Basic Metabolic Panel Recent Labs    89/86/74 0924 01/30/24 0252 01/31/24 0747  NA 138 136 133*  K 4.4 4.5 4.2  CL 108 104 100  CO2 18* 19* 21*  GLUCOSE 147* 123* 147*  BUN 62* 77* 93*  CREATININE 1.88* 2.02* 2.29*  CALCIUM  8.6* 8.6* 8.2*  MG 2.1 2.5*  --   PHOS 4.0  --  4.4   Liver Function Tests Recent Labs    01/29/24 0924 01/30/24 0252 01/31/24 0747  AST 32 42*  --   ALT 39 47*  --   ALKPHOS 127* 156*  --   BILITOT 1.1 1.1  --   PROT 5.8* 6.0*  --   ALBUMIN  1.9* 1.9* 1.6*   BNP (last 3 results) Recent Labs    03/09/23 1544 03/11/23 0218 06/27/23 1116  BNP 660.9* 491.3* 646.4*   Hemoglobin A1C No results for input(s): HGBA1C in the last 72 hours.  Fasting Lipid Panel Recent Labs    01/29/24 0924  TRIG 65    Medications:    Scheduled Medications:  amiodarone   400 mg Oral BID   atorvastatin   80 mg Oral Daily   bisacodyl   10 mg Rectal Q0600   Chlorhexidine  Gluconate Cloth  6 each Topical Daily   feeding  supplement  237 mL Oral BID BM   fluconazole  200 mg Oral Daily   hydrALAZINE   25 mg Oral Q8H   levalbuterol  0.63 mg Nebulization Q6H   methocarbamol   500 mg Oral TID   multivitamin with minerals  1 tablet Oral Daily   pantoprazole  (PROTONIX ) IV  40 mg Intravenous QHS   sodium chloride  flush  10-40 mL Intracatheter Q12H   spironolactone   25 mg Oral Daily    Infusions:  calcium  gluconate     famotidine (PEPCID) IV Stopped (01/30/24 2144)   heparin  1,700 Units/hr (01/31/24 0327)   piperacillin -tazobactam (ZOSYN )  IV 3.375 g (01/31/24 0504)   TPN ADULT (ION) 80 mL/hr at 01/30/24 2326    PRN Medications: benzonatate, guaiFENesin -dextromethorphan, HYDROmorphone  (DILAUDID ) injection, methocarbamol  (ROBAXIN ) injection, ondansetron  (ZOFRAN ) IV, mouth rinse, oxyCODONE , simethicone , sodium chloride  flush  Patient Profile    Patient with longstanding history of chronic heart failure with  preserved ejection fraction, RV failure with history of pulmonary embolism presents with esophageal perforation.  S/P Stent Esophageal Stent   Assessment/Plan   1.  Esophageal Perforation - Boerhaave syndrome.  - CT C/A/P: with pneumomediastinum and large mass-like density in lower esophagus - Prior EGD 3/25: erosive gastritis, duodenal deformity, and bleeding friable duodenal mucosa s/p hemostatic 01/23/24 S/P EGD with esophageal stent placed  - On TPN and also has po diet, not eating much.  - Initially on PO Augmentin & fluconazole. Now w/ ileus. Abx switched to IV Zosyn  given concern for poor absorption. Remains on PO fluconazole. Plan is for total of 6 wks abx+antifungal     2. Chronic HFpEF: h/o cardiogenic shock with RV failure in the setting of cardiac arrest thought to be caused by acute PE vs ACS in 3/25. Echo at the time showed EF 55%, severe, LVH, G2DD, and severely reduced RV function with strain. There was concern for cardiac sarcoid amyloid, however CMR LGE consistent with ischemic disease. RV on echo 9/25 with complete recovery, EF 60-65%, nl RV function.    - RHC 3/25: Mild PAH with severe RV failure though CO is preserved.   - CVP 9 today. Hold further doses of IV Lasix  for now given concern for infection and worsening AKI   - Hold Entresto  and spiro with worsening SCr - GDMT limited by renal function    3. 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 Medical management.  - atorvastatin  80 mg daily.    4.  AKI on CKD Stage 3b: Baseline sCr ~ 2.1. - SCr 2.3 today, BUN 93 (also getting TPN) - CVP 9. Hold diuretics today and follow BMP     5. PAF:   - s/p TEE/DCCV 3/25 - developed AF with RVR, unsure when as tele is not available. Currently AF  w/ CVR  - On amiodarone  400 mg bid for rate control. May need switch to IV given illeus/NPO status and poor absorption - He is on heparin  gtt, hgb remaining stable.   - Not candidate for TEE at this point with esophageal perforation and stent placement.  Would aim for DCCV after 4 wks therapeutic anticoagulation if he remains in AF.    6.  H/o DVT/PE:  Bilateral upper and lower DVTs, mixed acute and chronic.  - s/p infrarenal IVC placement 3/25 - V/Q 3/25 with multiple perfusion defects - will need lifelong AC, now on heparin  gtt.    7. Carotid stenosis: h/o CVA. TCAR  in 6/24. Okay to remain off plavix  at this point. - carotid US  9/25: widely patent carotid stent on R and no stenosis on L   8. ID: c/w persistent leukocytosis w/ WBC at 22K. PCT was elevated yesterday at 1.32. being treated for possible PNA. Now on Zosyn . Also w/ ileus. Looks worse today. Continues to feel poorly  - check lactic acid level, repeat PCT to assess trend, obtain BCx and check LFTs and ammonia level  - check co-ox  - plan repeat abdominal x-ray today   Length of Stay: [address] Marcine, PA-C  01/31/2024, 9:08 AM  Advanced Heart Failure Team Pager [phone] (M-F; 7a - 5p)  Please contact [facility] for night-coverage after hours (5p -7a ) and weekends on [email or web address]  Patient seen with PA, I formulated the plan and agree with the above note.   Barium swallow today showed leak in the distal esophagus.  WBCs 22, lactate not elevated (1.8), creatinine up to 2.34.  CVP 9 today.    Started on Zosyn  yesterday.   He remains in rate-controlled AF.    General: Ill-appearing Neck: JVP 8-9 cm, no thyromegaly or thyroid  nodule.  Lungs: Decreased BS at bases.  CV: Nondisplaced PMI.  Heart regular S1/S2, no S3/S4, no murmur.  No peripheral edema.   Abdomen: Soft, nontender, no hepatosplenomegaly, no distention.  Skin: Intact without lesions or rashes.  Neurologic: Alert and oriented x 3.  Psych: Normal affect. Extremities: No clubbing or cyanosis.  HEENT: Normal.   He will return to OR today for EGD and esophageal stent repositioning given leak on barium swallow.   Continue Zosyn  for sepsis syndrome.   With AKI and sepsis syndrome, no diuretics today.   Ezra Shuck 01/31/2024

## 2024-01-31 NOTE — Progress Notes (Signed)
 PHARMACY - ANTICOAGULATION CONSULT NOTE  Pharmacy Consult for heparin  infusion Indication: atrial fibrillation  Allergies  Allergen Reactions   Norvasc  [Amlodipine ] Swelling and Other (See Comments)    Excessive BLE swelling Skin discoloration, blotching   Jardiance  [email or web address] ] Itching and Other (See Comments)    Patient mentioned penile swelling, pain   Morphine And Codeine Itching    Opioid-induced pruritus    Patient Measurements: Height: 6' 1 (185.4 cm) Weight: 80.6 kg (177 lb 11.1 oz) IBW/kg (Calculated) : 79.9 HEPARIN  DW (KG): 80.6  Vital Signs: Temp: 98.9 F (37.2 C) (10/15 1246) Temp Source: Oral (10/15 1246) BP: 144/64 (10/15 1246) Pulse Rate: 91 (10/15 1246)  Labs: Recent Labs    01/29/24 0924 01/29/24 2036 01/30/24 0252 01/30/24 0639 01/30/24 2051 01/31/24 0747 01/31/24 0951  HGB 10.6*  --  10.3*  --   --  9.9*  --   HCT 34.2*  --  32.8*  --   --  31.6*  --   PLT 329  --  403*  --   --  470*  --   APTT 41* 42*  --  51*  --   --   --   HEPARINUNFRC 0.33  --   --  0.25* 0.23* 0.26*  --   CREATININE 1.88*  --  2.02*  --   --  2.29* 2.34*    Estimated Creatinine Clearance: 32.7 mL/min (A) (by C-G formula based on SCr of 2.34 mg/dL (H)).   Assessment: 73 yo M presenting for esophageal rupture, now s/p esophageal stent placement on 10/6. PMH significant for HFrEF, Afib on Eliquis  PTA. Last dose of Eliquis  01/20/24, time unknown. Pharmacy consulted for heparin  management.  Underwent EGD with stent reposition on 10/15 after finding esophageal leak. Discussed with Dr Shyrl and okay to restart heparin  tonight. Given recent procedure will start lower to factor bleed risk. Was previously on 1700 units/hr this morning prior to OR.   Hgb 9.9, plt 470. No s/sx of bleeding.   Goal of Therapy:  Heparin  level 0.3-0.5 units/ml Monitor platelets by anticoagulation protocol: Yes   Plan:  Restart heparin  infusion at 1000 units/hr Check heparin  level in 8  hours Monitor heparin  level, CBC, and s/sx of bleeding daily  Thank you for allowing pharmacy to participate in this patient's care,  Suzen Sour, PharmD, BCCCP Clinical Pharmacist  Phone: [phone] 01/31/2024 3:58 PM  Please check AMION for all [facility] Pharmacy phone numbers After 10:00 PM, call Main Pharmacy [phone]

## 2024-01-31 NOTE — Progress Notes (Signed)
     [address]       [name] [address]             [phone]       OR today for EGD and stent reposition  Babs Dabbs O Slayde Brault

## 2024-02-01 ENCOUNTER — Other Ambulatory Visit (HOSPITAL_COMMUNITY): Admission: RE | Admit: 2024-02-01 | Source: Ambulatory Visit

## 2024-02-01 ENCOUNTER — Inpatient Hospital Stay (HOSPITAL_COMMUNITY)

## 2024-02-01 ENCOUNTER — Encounter (HOSPITAL_COMMUNITY): Payer: Self-pay | Admitting: Thoracic Surgery (Cardiothoracic Vascular Surgery)

## 2024-02-01 DIAGNOSIS — I5032 Chronic diastolic (congestive) heart failure: Secondary | ICD-10-CM | POA: Diagnosis not present

## 2024-02-01 DIAGNOSIS — I480 Paroxysmal atrial fibrillation: Secondary | ICD-10-CM | POA: Diagnosis not present

## 2024-02-01 DIAGNOSIS — N1832 Chronic kidney disease, stage 3b: Secondary | ICD-10-CM | POA: Diagnosis not present

## 2024-02-01 DIAGNOSIS — N179 Acute kidney failure, unspecified: Secondary | ICD-10-CM | POA: Diagnosis not present

## 2024-02-01 DIAGNOSIS — J690 Pneumonitis due to inhalation of food and vomit: Secondary | ICD-10-CM

## 2024-02-01 DIAGNOSIS — J90 Pleural effusion, not elsewhere classified: Secondary | ICD-10-CM | POA: Diagnosis not present

## 2024-02-01 DIAGNOSIS — K223 Perforation of esophagus: Secondary | ICD-10-CM | POA: Diagnosis not present

## 2024-02-01 DIAGNOSIS — I4891 Unspecified atrial fibrillation: Secondary | ICD-10-CM | POA: Diagnosis not present

## 2024-02-01 LAB — COMPREHENSIVE METABOLIC PANEL WITH GFR
ALT: 46 U/L — ABNORMAL HIGH (ref 0–44)
ALT: 45 U/L — ABNORMAL HIGH (ref 0–44)
AST: 48 U/L — ABNORMAL HIGH (ref 15–41)
AST: 38 U/L (ref 15–41)
Albumin: <1.5 g/dL — ABNORMAL LOW (ref 3.5–5.0)
Albumin: 1.5 g/dL — ABNORMAL LOW (ref 3.5–5.0)
Alkaline Phosphatase: 154 U/L — ABNORMAL HIGH (ref 38–126)
Alkaline Phosphatase: 183 U/L — ABNORMAL HIGH (ref 38–126)
Anion gap: 11 (ref 5–15)
Anion gap: 14 (ref 5–15)
BUN: 95 mg/dL — ABNORMAL HIGH (ref 8–23)
BUN: 97 mg/dL — ABNORMAL HIGH (ref 8–23)
CO2: 19 mmol/L — ABNORMAL LOW (ref 22–32)
CO2: 20 mmol/L — ABNORMAL LOW (ref 22–32)
Calcium: 7.6 mg/dL — ABNORMAL LOW (ref 8.9–10.3)
Calcium: 8.3 mg/dL — ABNORMAL LOW (ref 8.9–10.3)
Chloride: 97 mmol/L — ABNORMAL LOW (ref 98–111)
Chloride: 100 mmol/L (ref 98–111)
Creatinine, Ser: 2.26 mg/dL — ABNORMAL HIGH (ref 0.61–1.24)
Creatinine, Ser: 2.57 mg/dL — ABNORMAL HIGH (ref 0.61–1.24)
GFR, Estimated: 30 mL/min — ABNORMAL LOW (ref >60)
GFR, Estimated: 26 mL/min — ABNORMAL LOW (ref >60)
Glucose, Bld: 316 mg/dL — ABNORMAL HIGH (ref 70–99)
Glucose, Bld: 160 mg/dL — ABNORMAL HIGH (ref 70–99)
Potassium: 4.3 mmol/L (ref 3.5–5.1)
Potassium: 4.2 mmol/L (ref 3.5–5.1)
Sodium: 127 mmol/L — ABNORMAL LOW (ref 135–145)
Sodium: 134 mmol/L — ABNORMAL LOW (ref 135–145)
Total Bilirubin: 1.4 mg/dL — ABNORMAL HIGH (ref 0.0–1.2)
Total Bilirubin: 0.7 mg/dL (ref 0.0–1.2)
Total Protein: 4.8 g/dL — ABNORMAL LOW (ref 6.5–8.1)
Total Protein: 6.1 g/dL — ABNORMAL LOW (ref 6.5–8.1)

## 2024-02-01 LAB — GLUCOSE, CAPILLARY
Glucose-Capillary: 144 mg/dL — ABNORMAL HIGH (ref 70–99)
Glucose-Capillary: 168 mg/dL — ABNORMAL HIGH (ref 70–99)
Glucose-Capillary: 164 mg/dL — ABNORMAL HIGH (ref 70–99)
Glucose-Capillary: 137 mg/dL — ABNORMAL HIGH (ref 70–99)
Glucose-Capillary: 90 mg/dL (ref 70–99)

## 2024-02-01 LAB — HEPARIN LEVEL (UNFRACTIONATED)
Heparin Unfractionated: <0.1 [IU]/mL — ABNORMAL LOW (ref 0.30–0.70)
Heparin Unfractionated: <0.1 [IU]/mL — ABNORMAL LOW (ref 0.30–0.70)

## 2024-02-01 LAB — CBC
HCT: 24 % — ABNORMAL LOW (ref 39.0–52.0)
Hemoglobin: 8.3 g/dL — ABNORMAL LOW (ref 13.0–17.0)
MCH: 30.2 pg (ref 26.0–34.0)
MCHC: 34.6 g/dL (ref 30.0–36.0)
MCV: 87.3 fL (ref 80.0–100.0)
Platelets: 490 K/uL — ABNORMAL HIGH (ref 150–400)
RBC: 2.75 MIL/uL — ABNORMAL LOW (ref 4.22–5.81)
RDW: 18.1 % — ABNORMAL HIGH (ref 11.5–15.5)
WBC: 16.1 K/uL — ABNORMAL HIGH (ref 4.0–10.5)
nRBC: 0 % (ref 0.0–0.2)

## 2024-02-01 LAB — MAGNESIUM
Magnesium: 2.7 mg/dL — ABNORMAL HIGH (ref 1.7–2.4)
Magnesium: 2.5 mg/dL — ABNORMAL HIGH (ref 1.7–2.4)

## 2024-02-01 LAB — TRIGLYCERIDES
Triglycerides: 691 mg/dL — ABNORMAL HIGH (ref <150)
Triglycerides: 56 mg/dL (ref <150)

## 2024-02-01 LAB — GLUCOSE, PLEURAL OR PERITONEAL FLUID: Glucose, Fluid: 154 mg/dL

## 2024-02-01 LAB — BODY FLUID CELL COUNT WITH DIFFERENTIAL
Eos, Fluid: 1 %
Lymphs, Fluid: 15 %
Monocyte-Macrophage-Serous Fluid: 65 % (ref 50–90)
Neutrophil Count, Fluid: 19 % (ref 0–25)
Total Nucleated Cell Count, Fluid: 129 uL (ref 0–1000)

## 2024-02-01 LAB — PHOSPHORUS
Phosphorus: 3.9 mg/dL (ref 2.5–4.6)
Phosphorus: 4.1 mg/dL (ref 2.5–4.6)

## 2024-02-01 LAB — LACTATE DEHYDROGENASE, PLEURAL OR PERITONEAL FLUID: LD, Fluid: 313 U/L — ABNORMAL HIGH (ref 3–23)

## 2024-02-01 LAB — PROTEIN, PLEURAL OR PERITONEAL FLUID: Total protein, fluid: <3 g/dL

## 2024-02-01 MED ORDER — ORAL CARE MOUTH RINSE
15.0000 mL | OROMUCOSAL | Status: DC
  Administered 2024-02-01 – 2024-02-02 (×22): 15 mL via OROMUCOSAL

## 2024-02-01 MED ORDER — NOREPINEPHRINE 4 MG/250ML-% IV SOLN
0.0000 ug/min | INTRAVENOUS | Status: DC
  Administered 2024-02-01: 9 ug/min via INTRAVENOUS
  Administered 2024-02-02: 5 ug/min via INTRAVENOUS
  Administered 2024-02-03: 8 ug/min via INTRAVENOUS
  Filled 2024-02-01 (×2): qty 250

## 2024-02-01 MED ORDER — HYDROMORPHONE HCL 1 MG/ML IJ SOLN
INTRAMUSCULAR | Status: AC
  Administered 2024-02-01: 1 mg via INTRAVENOUS
  Filled 2024-02-01: qty 1

## 2024-02-01 MED ORDER — DEXMEDETOMIDINE HCL IN NACL 400 MCG/100ML IV SOLN
0.0000 ug/kg/h | INTRAVENOUS | Status: DC
  Administered 2024-02-01: 0.1 ug/kg/h via INTRAVENOUS
  Administered 2024-02-01 – 2024-02-02 (×3): 0.6 ug/kg/h via INTRAVENOUS
  Filled 2024-02-01 (×5): qty 100

## 2024-02-01 MED ORDER — HYDROMORPHONE HCL 1 MG/ML IJ SOLN: 1.0000 mg | Freq: Once | INTRAMUSCULAR | Status: AC

## 2024-02-01 MED ORDER — TRACE MINERALS CU-MN-SE-ZN 300-55-60-3000 MCG/ML IV SOLN
INTRAVENOUS | Status: AC
  Filled 2024-02-01: qty 761.6

## 2024-02-01 MED ORDER — MIDAZOLAM HCL (PF) 2 MG/2ML IJ SOLN
2.0000 mg | Freq: Once | INTRAMUSCULAR | Status: AC
  Administered 2024-02-01: 2 mg via INTRAVENOUS
  Filled 2024-02-01: qty 2

## 2024-02-01 MED ORDER — ORAL CARE MOUTH RINSE
15.0000 mL | OROMUCOSAL | Status: DC | PRN
Start: 2024-02-01 — End: 2024-02-04

## 2024-02-01 MED ORDER — LIDOCAINE 5 % EX PTCH
1.0000 | MEDICATED_PATCH | CUTANEOUS | Status: DC
  Administered 2024-02-01 – 2024-04-05 (×29): 1 via TRANSDERMAL
  Filled 2024-02-01 (×44): qty 1

## 2024-02-01 MED ORDER — HYDROMORPHONE HCL 1 MG/ML IJ SOLN
0.5000 mg | INTRAMUSCULAR | Status: DC | PRN
  Administered 2024-02-01 – 2024-02-02 (×10): 0.5 mg via INTRAVENOUS
  Filled 2024-02-01 (×11): qty 0.5

## 2024-02-01 MED ORDER — HYDROMORPHONE HCL 1 MG/ML IJ SOLN: 0.5000 mg | INTRAMUSCULAR | Status: DC | PRN

## 2024-02-01 MED ORDER — SODIUM CHLORIDE 0.9% FLUSH
10.0000 mL | Freq: Three times a day (TID) | INTRAVENOUS | Status: DC
  Administered 2024-02-01 – 2024-02-26 (×58): 10 mL via INTRAPLEURAL

## 2024-02-01 NOTE — Progress Notes (Addendum)
 Patient ID: Billy [name], male   DOB: Sep 21, 1950, 73 y.o.   MRN: [id number]     Advanced Heart Failure Rounding Note  Cardiologist: Dorn Lesches, MD   Chief Complaint: S/P esophageal stent  Subjective:    Antibiotic regimen changed>>WBC 22>16.1K, PCT 1.32>1.28. CXR  LLL infiltrate. Abdominal x-ray suggestive of ileus>>concern for poor absorption Abx switched to IV Zosyn .   S/p EGD with stent repositioning by Dr. Shyrl 10/15.   SCr continues to rise, 1.88>>2.02>>2.29>2.57 BUN 62>>77>>93> 97  Cranky this morning and uncomfortable. Helped to reposition him.   Objective:    Weight Range: 83.5 kg Body mass index is 24.29 kg/m.   Vital Signs:   Temp:  [98.1 F (36.7 C)-100.9 F (38.3 C)] 100.9 F (38.3 C) (10/16 0756) Pulse Rate:  [64-104] 97 (10/16 0905) Resp:  [16-33] 30 (10/16 0905) BP: (77-202)/(37-159) 176/159 (10/16 0815) SpO2:  [91 %-100 %] 93 % (10/16 0905) FiO2 (%):  [40 %-60 %] 40 % (10/16 0750) Weight:  [80.6 kg-83.5 kg] 83.5 kg (10/16 0412) Last BM Date : 01/30/24  Weight change: Filed Weights   01/30/24 0500 01/31/24 1246 02/01/24 0412  Weight: 80.6 kg 80.6 kg 83.5 kg   Intake/Output:  Intake/Output Summary (Last 24 hours) at 02/01/2024 1214 Last data filed at 02/01/2024 0900 Gross per 24 hour  Intake 3817.58 ml  Output 1050 ml  Net 2767.58 ml    Physical Exam   General:  elderly appearing.  No respiratory difficulty Neck: JVD ~8 cm.  Cor: Regular rate & irregular rhythm. Lungs: clear, diminished bases Extremities: trace BLE edema  Neuro: alert & oriented x 3. Affect flat.   Telemetry   Afib 80s, personally reviewed   Labs    CBC Recent Labs    01/31/24 1545 01/31/24 1716 02/01/24 0430  WBC 20.7*  --  16.1*  HGB 8.5* 8.2* 8.3*  HCT 27.5* 24.0* 24.0*  MCV 89.6  --  87.3  PLT 442*  --  490*   Basic Metabolic Panel Recent Labs    89/83/74 0540 02/01/24 0936  NA 127* 134*  K 4.3 4.2  CL 97* 100  CO2 19* 20*  GLUCOSE 316*  160*  BUN 95* 97*  CREATININE 2.26* 2.57*  CALCIUM  7.6* 8.3*  MG 2.7* 2.5*  PHOS 3.9 4.1   Liver Function Tests Recent Labs    02/01/24 0540 02/01/24 0936  AST 48* 38  ALT 46* 45*  ALKPHOS 154* 183*  BILITOT 1.4* 0.7  PROT 4.8* 6.1*  ALBUMIN  <1.5* 1.5*   BNP (last 3 results) Recent Labs    03/09/23 1544 03/11/23 0218 06/27/23 1116  BNP 660.9* 491.3* 646.4*   Hemoglobin A1C No results for input(s): HGBA1C in the last 72 hours.  Fasting Lipid Panel Recent Labs    02/01/24 0936  TRIG 56    Medications:    Scheduled Medications:  bisacodyl   10 mg Rectal Q0600   Chlorhexidine  Gluconate Cloth  6 each Topical Daily   levalbuterol  0.63 mg Nebulization Q6H   lidocaine   1 patch Transdermal Q24H   mouth rinse  15 mL Mouth Rinse Q2H   pantoprazole  (PROTONIX ) IV  40 mg Intravenous QHS   sodium chloride  flush  10-40 mL Intracatheter Q12H    Infusions:  sodium chloride      amiodarone  30 mg/hr (02/01/24 0700)   dexmedetomidine  (PRECEDEX ) IV infusion 0.1 mcg/kg/hr (02/01/24 1012)   fluconazole (DIFLUCAN) IV 200 mg (02/01/24 1006)   heparin  1,300 Units/hr (02/01/24 0700)   piperacillin -tazobactam (ZOSYN )  IV 12.5 mL/hr at 02/01/24 0700   TPN ADULT (ION) 81 mL/hr at 02/01/24 0700   TPN ADULT (ION)      PRN Medications: hydrALAZINE , HYDROmorphone  (DILAUDID ) injection, ondansetron  (ZOFRAN ) IV, mouth rinse, sodium chloride  flush  Patient Profile    Patient with longstanding history of chronic heart failure with preserved ejection fraction, RV failure with history of pulmonary embolism presents with esophageal perforation.  S/P Stent Esophageal Stent. Stent repositioned 10/15.  Assessment/Plan  1.  Esophageal Perforation - Boerhaave syndrome.  - CT C/A/P: with pneumomediastinum and large mass-like density in lower esophagus - Prior EGD 3/25: erosive gastritis, duodenal deformity, and bleeding friable duodenal mucosa s/p hemostatic - 01/23/24 S/P EGD with  esophageal stent placed  - On TPN. Keep NPO for now.  - Initially on PO Augmentin & fluconazole. Now w/ ileus. Abx switched to IV Zosyn  given concern for poor absorption. Remains on PO fluconazole. Plan is for total of 6 wks abx+antifungal   - S/p EGD with stent repositioning yesterday by Dr. Shyrl  2. Chronic HFpEF: h/o cardiogenic shock with RV failure in the setting of cardiac arrest thought to be caused by acute PE vs ACS in 3/25. Echo at the time showed EF 55%, severe, LVH, G2DD, and severely reduced RV function with strain. There was concern for cardiac sarcoid amyloid, however CMR LGE consistent with ischemic disease. RV on echo 9/25 with complete recovery, EF 60-65%, nl RV function.    - RHC 3/25: Mild PAH with severe RV failure though CO is preserved.   - CVP 8/9 today. Hold diuretics today - Hold Entresto  and spiro with worsening SCr - GDMT limited by renal function  - Update echo.    3. 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 Medical management.  - atorvastatin  80 mg daily.    4.  AKI on CKD Stage 3b: Baseline sCr ~ 2.1. - SCr 2.57 today, BUN 97 (also getting TPN)    5. PAF:   - s/p TEE/DCCV 3/25 - developed AF with RVR, unsure when as tele is not available. Currently AF w/ CVR  - On IV amiodarone  given illeus/NPO status and poor absorption - He is on heparin  gtt, hgb remaining stable.  - Not candidate for TEE at this point with esophageal perforation and stent placement.  Would aim for DCCV after 4 wks therapeutic anticoagulation if he remains in AF.  6.  H/o DVT/PE:  Bilateral upper and lower DVTs, mixed acute and chronic.  - s/p infrarenal IVC placement 3/25 - V/Q 3/25 with multiple perfusion defects - will need lifelong AC, now on heparin  gtt.    7. Carotid stenosis: h/o CVA. TCAR in 6/24. Okay to remain off plavix  at this point. - carotid US  9/25: widely patent carotid stent on R and no stenosis on  L   8. ID: c/w persistent leukocytosis w/ WBC at 22K. PCT was elevated 10/14 at 1.32. being treated for possible PNA. Now on Zosyn . Also w/ ileus. - lactic acid level stable - PCT trending down - co-ox 74%   Length of Stay: 11  Beckey LITTIE Coe, NP  02/01/2024, 12:14 PM  Advanced Heart Failure Team Pager [phone] (M-F; 7a - 5p)  Please contact [facility] for night-coverage after hours (5p -7a ) and weekends on [email or web address]  Patient seen with NP, I formulated the plan and agree with the above note.   Back to OR yesterday for esophageal  stent repositioning.  Has chest pain post-procedure.  He has a small to moderate pleural effusion on right.  He is now extubated.  He is on NE 4.  Creatinine worse at 2.57 with higher BUN.  CVP 7.   He is awake, seems somewhat confused.   General: NAD Neck: No JVD, no thyromegaly or thyroid  nodule.  Lungs: Clear to auscultation bilaterally with normal respiratory effort. CV: Nondisplaced PMI.  Heart regular S1/S2, no S3/S4, no murmur.  No peripheral edema.  .  Abdomen: Soft, nontender, no hepatosplenomegaly, no distention.  Skin: Intact without lesions or rashes.  Neurologic: Mild confusion.  Extremities: No clubbing or cyanosis.  HEENT: Normal.   He is in atrial fibrillation persistently, now on amiodarone  30 mg/hr for rate control and heparin  gtt.   Volume status ok with CVP 7, creatinine rising.  No diuretics today.   He is on NE 4, hopefully can wean off now that he is off sedation. Follow co-ox. I am going to get an echo to reassess LV/RV function.   He will be getting a chest tube today for left effusion.   Covering with Zosyn /fluconazole.   CRITICAL CARE Performed by: Ezra Shuck  Total critical care time: 35 minutes  Critical care time was exclusive of separately billable procedures and treating other patients.  Critical care was necessary to treat or prevent imminent or life-threatening deterioration.  Critical care was time  spent personally by me on the following activities: development of treatment plan with patient and/or surrogate as well as nursing, discussions with consultants, evaluation of patient's response to treatment, examination of patient, obtaining history from patient or surrogate, ordering and performing treatments and interventions, ordering and review of laboratory studies, ordering and review of radiographic studies, pulse oximetry and re-evaluation of patient's condition.  Ezra Shuck 02/01/2024 12:43 PM

## 2024-02-01 NOTE — Progress Notes (Signed)
     [address]       [place] [address]             [phone]       Remains extubated  Vitals:   02/01/24 0815 02/01/24 0905  BP: (!) 176/159   Pulse: (!) 102 97  Resp: (!) 33 (!) 30  Temp:    SpO2: 97% 93%   Vent Mode: PSV;CPAP FiO2 (%):  [40 %-60 %] 40 % Set Rate:  [18 bmp-20 bmp] 20 bmp Vt Set:  [640 mL] 640 mL PEEP:  [5 cmH20] 5 cmH20 Pressure Support:  [10 cmH20] 10 cmH20 Plateau Pressure:  [22 cmH20-23 cmH20] 23 cmH20   73yo male with esophageal perforation s/p stent repositioning Wean to extubate NPO for now  [employer]

## 2024-02-01 NOTE — Progress Notes (Signed)
   [address] [place] [address]             [phone]   Sitting up in bed, still has pain  BP (!) 93/53   Pulse 68   Temp 99.3 F (37.4 C) (Oral)   Resp (!) 30   Ht 6' 1 (1.854 m)   Wt 83.5 kg   SpO2 100%   BMI 24.29 kg/m   Intake/Output Summary (Last 24 hours) at 02/01/2024 1725 Last data filed at 02/01/2024 1400 Gross per 24 hour  Intake 4150.94 ml  Output 1600 ml  Net 2550.94 ml   Ct placed right chest- 300 ml murky fluid, not frank pus On TNA, IV antibiotics  Elspeth C. Kerrin, MD [facility] [phone]

## 2024-02-01 NOTE — Progress Notes (Signed)
 Echocardiogram Exam rescheduled to tomorrow due to scheduling conflicts.  Billy [name] 02/01/2024, 5:29 PM

## 2024-02-01 NOTE — Progress Notes (Signed)
 PHARMACY - TOTAL PARENTERAL NUTRITION CONSULT NOTE   Indication: intolerance to enteral feedings, esophageal perforation  Patient Measurements: Height: 6' 1 (185.4 cm) Weight: 83.5 kg (184 lb 1.4 oz) IBW/kg (Calculated) : 79.9 TPN AdjBW (KG): 80.6 Body mass index is 24.29 kg/m.  Assessment:  73 year old man with medical history significant for cardiac arrest earlier this year who was admitted after several days of nausea and vomiting followed by sudden onset severe tearing epigastric pain radiating to back and chest. 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 Pharmacy consulted to initiate TPN due to inability to tolerate enteral nutrition at this time.  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 PTA takes torsemide  20 mg PO daily, now held. Aggressive fluid resuscitation recommended per GI. Per HF, patient currently euvolemic but will hold diuretic for now. Due to CHF will target lower end of fluid range for TPN and supplement additional fluids per MD outside of TPN to prevent fluid overload.   Stent placed on 10/6, esophogram not showing any leak. Diet advanced to CLD.   Glucose / Insulin : A1C 6.0% (02/2023) ;  BGs 133-184, SSI stopped Electrolytes: Na 134, CO2 20, CoCa ~10.3, Mg 2.5 (none in TPN), others wnl  Renal: Scr 2.57 up (bsl ~2), BUN 97 up  Hepatic: AST/ALT 38 / 45, Alk Phos 183, tbili WNL, alb 1.5, TG 56 Intake / Output; MIVF:  furosemide  IV 80 x2 on 10/14; UOP down 0.5 mL/kg/hr charted, net +10.8 L, LBM 10/14 GI Imaging: 10/15 Esoph XR: esophageal perforation/leak at the distal esophagus, proximal to the esophageal stent 10/14 KUB: Gas-filled loops of small bowel and colon suggestive of ileus 10/7 Esoph XR: No leak associated with stent coverage of esophageal perforation  10/06 Esoph XR: distal esophageal perforation 10/05 CT: concerning for esophageal perforation, masslike area of soft tissue thickening adjacent to and  surrounding the esophagus  GI Surgeries / Procedures:  10/6: esophageal stent placement  10/15 EGD and stent repositioning   Central access: 01/22/24 TPN start date: 01/22/24  Nutritional Goals: Goal TPN rate is 82 mL/hr (provides 110 g of protein and 2099 kcals per day) Concentrated TPN 10/16 (provides 114 g of protein and 2100 kcals per day)  RD Assessment:  Estimated Needs Total Energy Estimated Needs: 2100-2300 kcals Total Protein Estimated Needs: 110-130 g Total Fluid Estimated Needs: 2L  Current Nutrition:  TPN 10/13- 10/14 DYS1: not taking any po  10/15: NPO d/t increased abd distension   Plan:  Adjusted to concentrated TPN at new goal 70 mL/hr at 1800 to meet 100% total nutritional needs (provides 114 g of protein and 2100 kcals per day).   Electrolytes in TPN: increase Na 93 mEq/L, decrease K 11 mEq/L, decrease Ca 2 mEq/L, Mg 0 mEq/L, decrease Phos 3 mmol/L. Cl:Ac max acetate Add standard MVI and trace elements to TPN SSI stopped and decrease BG checks to q8hr  Add 12 units insulin  regular to TPN  Monitor TPN labs on Mon/Thurs, and PRN F/u ability to take PO, wean TPN as able   First set of labs drawn today thought to be spurious.   Thank you for involving pharmacy in this patient's care.  Delon Sax, PharmD, BCPS Clinical Pharmacist Clinical phone for 02/01/2024 is [phone] 02/01/2024 6:57 AM

## 2024-02-01 NOTE — Progress Notes (Addendum)
 Occupational Therapy Treatment Patient Details Name: Billy [name] MRN: [id number] DOB: [birth year] Today's Date: 02/01/2024   History of present illness Billy [name] is a 73 y.o. male who presented to [facility] ED 01/21/24 with several days of vomiting followed by an episode of severe tearing epigastric pain. Work-up revealed esophageal tear. Pt s/p esophageal stent with EGD 10/7. Return to OR for esophageal stent adjustment on 10/15, remained intubated after procedure due to suspected aspiration. Extubated 10/16. PMHx: HFrEF, AFib, CAD, PE complicated by cardiogenic shock, CKD 4, MI, peripheral neuropathy, and TIA.   OT comments  Pt seen for first session since return to OR and brief reintubation. Pt noted with significant decline in abilities w/ cognitive, balance and strength deficits noted. Pt restless, attempting to get OOB d/t back pain reports with nursing present. Assisted with bed mobility with Mod A (+2 helpful for safety), standing with Mod A x 2 and up to Max A needed to take steps along bedside. Based on current functional abilities, would recommend consideration of postacute rehab stay > 3 hours of therapy per day given previous high PLOF.  VSS on 4 L O2.      If plan is discharge home, recommend the following:  A lot of help with walking and/or transfers;Two people to help with walking and/or transfers;A lot of help with bathing/dressing/bathroom;Assistance with cooking/housework;Direct supervision/assist for medications management;Direct supervision/assist for financial management;Assist for transportation;Help with stairs or ramp for entrance;Supervision due to cognitive status   Equipment Recommendations  Other (comment) (TBD)    Recommendations for Other Services Rehab consult    Precautions / Restrictions Precautions Precautions: Fall Recall of Precautions/Restrictions: Impaired Restrictions Weight Bearing Restrictions Per Provider Order: No       Mobility Bed Mobility Overal  bed mobility: Needs Assistance Bed Mobility: Supine to Sit, Sit to Supine     Supine to sit: Mod assist Sit to supine: Mod assist, +2 for physical assistance, +2 for safety/equipment   General bed mobility comments: pt bringing LE to EOB without assist, able to lift trunk but required Mod A to scoot hips to EOB. Mod A x 2 to bring BLE back to bed and safely guide trunk    Transfers Overall transfer level: Needs assistance Equipment used: 2 person hand held assist Transfers: Sit to/from Stand Sit to Stand: Mod assist, +2 physical assistance, +2 safety/equipment           General transfer comment: Mod A x 2 for 2-3 standing trials with nursing. tactile assist to place UE on therapist's arms. Max A x 2 to step at bedside with manual assist needed to advance each LE     Balance Overall balance assessment: Needs assistance Sitting-balance support: No upper extremity supported, Feet supported Sitting balance-Leahy Scale: Fair     Standing balance support: During functional activity, Reliant on assistive device for balance, Single extremity supported, Bilateral upper extremity supported Standing balance-Leahy Scale: Poor                             ADL either performed or assessed with clinical judgement   ADL Overall ADL's : Needs assistance/impaired                     Lower Body Dressing: Maximal assistance;Sitting/lateral leans;Bed level                 General ADL Comments: Pt reporting back pain and restless, attempting to exit bed with  RN in room. OT entering to assist w/ EOB, standing trials w/ bedside steps and repositioning in bed.    Extremity/Trunk Assessment Upper Extremity Assessment Upper Extremity Assessment: Generalized weakness;Right hand dominant   Lower Extremity Assessment Lower Extremity Assessment: Defer to PT evaluation        Vision   Vision Assessment?: No apparent visual deficits   Perception     Praxis      Communication Communication Communication: Impaired Factors Affecting Communication: Difficulty expressing self   Cognition Arousal: Alert Behavior During Therapy: Restless, Impulsive Cognition: Cognition impaired   Orientation impairments: Situation, Time Awareness: Intellectual awareness impaired, Online awareness impaired Memory impairment (select all impairments): Working memory Attention impairment (select first level of impairment): Sustained attention, Focused attention Executive functioning impairment (select all impairments): Organization, Reasoning, Problem solving OT - Cognition Comments: Pt restless and impulsive with movement. perseverating on back pain, poor safety awareness. difficulty sequencing tasks and reasoning for purpose of tasks.                 Following commands: Impaired Following commands impaired: Follows one step commands inconsistently, Follows one step commands with increased time      Cueing   Cueing Techniques: Verbal cues, Gestural cues  Exercises      Shoulder Instructions       General Comments      Pertinent Vitals/ Pain       Pain Assessment Pain Assessment: Faces Faces Pain Scale: Hurts even more Pain Location: back Pain Descriptors / Indicators: Grimacing, Guarding Pain Intervention(s): Monitored during session, Repositioned  Home Living                                          Prior Functioning/Environment              Frequency  Min 2X/week        Progress Toward Goals  OT Goals(current goals can now be found in the care plan section)  Progress towards OT goals: OT to reassess next treatment  Acute Rehab OT Goals OT Goal Formulation: With patient Time For Goal Achievement: 02/07/24 Potential to Achieve Goals: Good ADL Goals Pt Will Perform Grooming: with supervision;standing Pt Will Perform Lower Body Bathing: with supervision;sit to/from stand Pt Will Perform Lower Body Dressing:  with supervision;sit to/from stand Pt Will Transfer to Toilet: with supervision;ambulating;regular height toilet Pt Will Perform Toileting - Clothing Manipulation and hygiene: with supervision;sit to/from stand Additional ADL Goal #1: Pt will complete bed mobility mod I in preparation for ADLs.  Plan      Co-evaluation                 AM-PAC OT 6 Clicks Daily Activity     Outcome Measure   Help from another person eating meals?: Total (NPO) Help from another person taking care of personal grooming?: A Little Help from another person toileting, which includes using toliet, bedpan, or urinal?: A Lot Help from another person bathing (including washing, rinsing, drying)?: A Lot Help from another person to put on and taking off regular upper body clothing?: A Lot Help from another person to put on and taking off regular lower body clothing?: A Lot 6 Click Score: 12    End of Session    OT Visit Diagnosis: Unsteadiness on feet (R26.81);Other abnormalities of gait and mobility (R26.89);Pain;Muscle weakness (generalized) (M62.81)   Activity Tolerance Other (comment) (  limited by restlessness and cognition)   Patient Left in bed;with call bell/phone within reach;with bed alarm set   Nurse Communication Mobility status (RN present)        Time: 8891-8873 OT Time Calculation (min): 18 min  Charges: OT General Charges $OT Visit: 1 Visit OT Treatments $Therapeutic Activity: 8-22 mins  Mliss NOVAK, OTR/L Acute Rehab Services Office: [phone]   Mliss Fish 02/01/2024, 12:42 PM

## 2024-02-01 NOTE — Progress Notes (Signed)
 POCUS  Left chest: from lateral view no sig effusion  Right chest: small/mod right effusion.   Plan  Discussed w/ Dr Sharie foot  Will place small bore CT later today

## 2024-02-01 NOTE — Plan of Care (Signed)

## 2024-02-01 NOTE — Procedures (Signed)
 Extubation Procedure Note  Patient Details:   Name: Billy [name] DOB: 1951/02/20 MRN: [id number]   Airway Documentation:    Vent end date: 02/01/24 Vent end time: 0900   Evaluation  O2 sats: stable throughout Complications: No apparent complications Patient did tolerate procedure well. Bilateral Breath Sounds: Rhonchi, Diminished   Yes  Patient extubated per order. Positive cuff leak. No stridor noted. Patient has good strong cough. Vitals are stable on 4L [place]. RN at bedside.  Quianna Avery H Tane Biegler 02/01/2024, 9:06 AM

## 2024-02-01 NOTE — Progress Notes (Signed)
 PHARMACY - ANTICOAGULATION  Pharmacy Consult for heparin   Indication: atrial fibrillation Brief A/P: Heparin  level subtherapeutic Increase Heparin  rate  Allergies  Allergen Reactions   Norvasc  [Amlodipine ] Swelling and Other (See Comments)    Excessive BLE swelling Skin discoloration, blotching   Jardiance  [Empagliflozin ] Itching and Other (See Comments)    Patient mentioned penile swelling, pain   Morphine And Codeine Itching    Opioid-induced pruritus    Patient Measurements: Height: 6' 1 (185.4 cm) Weight: 83.5 kg (184 lb 1.4 oz) IBW/kg (Calculated) : 79.9 HEPARIN  DW (KG): 80.6  Vital Signs: Temp: 98.8 F (37.1 C) (10/16 0400) Temp Source: Axillary (10/16 0400) BP: 118/49 (10/16 0545) Pulse Rate: 71 (10/16 0545)  Labs: Recent Labs    01/29/24 0924 01/29/24 2036 01/30/24 0252 01/30/24 0639 01/30/24 2051 01/31/24 0747 01/31/24 0951 01/31/24 1545 01/31/24 1716 01/31/24 1810 02/01/24 0430  HGB 10.6*  --    < >  --   --  9.9*  --  8.5* 8.2*  --  8.3*  HCT 34.2*  --    < >  --   --  31.6*  --  27.5* 24.0*  --  24.0*  PLT 329  --    < >  --   --  470*  --  442*  --   --  490*  APTT 41* 42*  --  51*  --   --   --   --   --   --   --   HEPARINUNFRC 0.33  --   --  0.25*   < > 0.26*  --   --   --  <0.10* <0.10*  CREATININE 1.88*  --    < >  --   --  2.29* 2.34* 2.50*  --   --   --    < > = values in this interval not displayed.    Estimated Creatinine Clearance: 30.6 mL/min (A) (by C-G formula based on SCr of 2.5 mg/dL (H)).   Assessment: 73 y.o. male s/p EGD 10/15 with h/o Afib, Eliquis  on hold, for heparin   Goal of Therapy:  Heparin  level 0.3-0.5 units/ml Monitor platelets by anticoagulation protocol: Yes   Plan:  Increase Heparin  1300 units/hr Check heparin  level in 8 hours.  Cathlyn Arrant, PharmD, BCPS

## 2024-02-01 NOTE — Plan of Care (Signed)

## 2024-02-01 NOTE — Progress Notes (Addendum)
 PT Cancellation Note  Patient Details Name: Remmy Riffe MRN: [id number] DOB: Jun 18, 1950   Cancelled Treatment:    Reason Eval/Treat Not Completed: Patient at procedure or test/unavailable  Team in room for chest tube insertion. Will attempt to follow-up later as schedule permits.  Spoke with RN: Pt remains lethargic after procedure, requests we follow up tomorrow. Will plan to evaluate 10/17   Leontine Roads, PT, DPT [facility]  Rehabilitation Services Physical Therapist Office: [phone] Website: [email or web address]  [name] 02/01/2024, 1:45 PM

## 2024-02-01 NOTE — Progress Notes (Signed)
 PHARMACY - ANTICOAGULATION CONSULT NOTE  Pharmacy Consult for heparin  infusion Indication: atrial fibrillation  Allergies  Allergen Reactions   Norvasc  [Amlodipine ] Swelling and Other (See Comments)    Excessive BLE swelling Skin discoloration, blotching   Jardiance  [Empagliflozin ] Itching and Other (See Comments)    Patient mentioned penile swelling, pain   Morphine And Codeine Itching    Opioid-induced pruritus    Patient Measurements: Height: 6' 1 (185.4 cm) Weight: 83.5 kg (184 lb 1.4 oz) IBW/kg (Calculated) : 79.9 HEPARIN  DW (KG): 80.6  Vital Signs: Temp: 100.9 F (38.3 C) (10/16 0756) Temp Source: Axillary (10/16 0756) BP: 93/53 (10/16 1430) Pulse Rate: 68 (10/16 1430)  Labs: Recent Labs    01/29/24 2036 01/30/24 0252 01/30/24 0639 01/30/24 2051 01/31/24 0747 01/31/24 0951 01/31/24 1545 01/31/24 1716 01/31/24 1810 02/01/24 0430 02/01/24 0540 02/01/24 0936 02/01/24 1423  HGB  --    < >  --   --  9.9*  --  8.5* 8.2*  --  8.3*  --   --   --   HCT  --    < >  --   --  31.6*  --  27.5* 24.0*  --  24.0*  --   --   --   PLT  --    < >  --   --  470*  --  442*  --   --  490*  --   --   --   APTT 42*  --  51*  --   --   --   --   --   --   --   --   --   --   HEPARINUNFRC  --   --  0.25*   < > 0.26*  --   --   --  <0.10* <0.10*  --   --  <0.10*  CREATININE  --    < >  --   --  2.29*   < > 2.50*  --   --   --  2.26* 2.57*  --    < > = values in this interval not displayed.    Estimated Creatinine Clearance: 29.8 mL/min (A) (by C-G formula based on SCr of 2.57 mg/dL (H)).   Assessment: 73 yo M presenting for esophageal rupture, now s/p esophageal stent placement on 10/6. PMH significant for HFrEF, Afib on Eliquis  PTA. Last dose of Eliquis  01/20/24, time unknown. Pharmacy consulted for heparin  management.  Underwent EGD with stent reposition on 10/15 after finding esophageal leak. Heparin  was stopped at 1312 for chest tube placement - heparin  level was collected  an hour later which was found to be undetectable. Hgb 8.3, plt 490. No s/sx of bleeding.  Goal of Therapy:  Heparin  level 0.3-0.5 units/ml Monitor platelets by anticoagulation protocol: Yes   Plan:  Restart heparin  infusion at 1300 units/hr Check heparin  level in 8 hours Monitor heparin  level, CBC, and s/sx of bleeding daily  Thank you for allowing pharmacy to participate in this patient's care,  Suzen Sour, PharmD, BCCCP Clinical Pharmacist  Phone: [phone] 02/01/2024 3:38 PM  Please check AMION for all [facility] Pharmacy phone numbers After 10:00 PM, call Main Pharmacy [phone]

## 2024-02-01 NOTE — Op Note (Signed)
      [address]       Billy [name] [address]             [phone]        02/01/2024  Patient:  Billy [name] Pre-Op Dx: esophageal perforation   Post-op Dx:  same Procedure: - Esophagogastroscopy - adjustment of previous stent  Surgeon and Role:      * Cherri Yera, Linnie KIDD, MD - Primary  Anesthesia  general EBL:  0ml Blood Administration: none Specimen:  none   Counts: correct   Indications: 73yo male previous treated with a stent for an esophageal perforation.  It looks like the stent has migrated and there is a leak on esophagram.  He was taken to the OR for repositioning.  Findings: Perforation identified at 40cm.  The stent was distal to this.  The was pulled back under fluorscopy 5cm.  The marked area was in the midportion of the stent.    Operative Technique: After the risks, benefits and alternatives were thoroughly discussed, the patient was brought to the operative theatre.  Anesthesia was induced. The patient was prepped and draped in normal sterile fashion.  An appropriate surgical pause was performed, and pre-operative antibiotics were dosed accordingly.  The gastroscope was advanced through the oropharynx into the cervical esophagus under direct visualization.  The scope was passed into the stent.  The scope was then pulled back, and the esophageal mucosa was visualized.  A mucosal defect was noted at 40 cm from the incisors.  The stent was distal to this.  The was pulled back under fluorscopy 5cm.  The marked area was in the midportion of the stent.    The patient tolerated the procedure without any immediate complications, and was transferred to the PACU in stable condition.  Ramaya Guile KIDD Rayas

## 2024-02-01 NOTE — Plan of Care (Signed)
  Problem: Nutrition: Goal: Adequate nutrition will be maintained Outcome: Progressing   Problem: Coping: Goal: Level of anxiety will decrease Outcome: Progressing   Problem: Elimination: Goal: Will not experience complications related to urinary retention Outcome: Progressing   Problem: Pain Managment: Goal: General experience of comfort will improve and/or be controlled Outcome: Progressing   Problem: Nutritional: Goal: Maintenance of adequate nutrition will improve Outcome: Progressing   Problem: Respiratory: Goal: Ability to maintain a clear airway and adequate ventilation will improve Outcome: Progressing

## 2024-02-01 NOTE — Procedures (Signed)
 Insertion of Chest Tube Procedure Note  Billy [name]  [id number]  02-22-1951  Date:02/01/24  Time:1:47 PM    Provider Performing: [name] Banner   Procedure: Chest Tube Insertion [id number])  Indication(s) Effusion; concern for infected pleural space, drainage of pleural effusion and evaluation of pleural fluid   Consent Risks of the procedure as well as the alternatives and risks of each were explained to the patient and/or caregiver.  Consent for the procedure was obtained and is signed in the bedside chart  Anesthesia Topical only with 1% lidocaine    Administered 2mg  IV versed  ETCO2 20s entire procedure.  No apnea  No desaturation   Time Out Verified patient identification, verified procedure, site/side was marked, verified correct patient position, special equipment/implants available, medications/allergies/relevant history reviewed, required imaging and test results available.   Sterile Technique Maximal sterile technique including full sterile barrier drape, hand hygiene, sterile gown, sterile gloves, mask, hair covering, sterile ultrasound probe cover (if used).   Procedure Description Ultrasound used to identify appropriate pleural anatomy for placement and overlying skin marked. Area of placement cleaned and draped in sterile fashion.  A 14 French pigtail pleural catheter was placed into the right pleural space using Seldinger technique. Appropriate return of fluid was obtained.  The tube was connected to atrium and placed on -20 cm H2O wall suction.   Complications/Tolerance None; patient tolerated the procedure well. Chest X-ray is ordered to verify placement.   EBL Minimal  Specimen(s) fluid

## 2024-02-01 NOTE — Progress Notes (Signed)
 NAME:  Billy [name], MRN:  [id number], DOB:  1950-09-11, LOS: 11 ADMISSION DATE:  01/21/2024, CONSULTATION DATE:  01/21/24 REFERRING MD:  EDP, CHIEF COMPLAINT:  chest pain   History of Present Illness:  Billy [name] is a 73 year old man known to our service with cardiac arrest earlier this year along with HFrEF, Afib, PE who is presenting with several days of N/V from a stomach bug followed by sudden onset severe tearing epigastric pain radiating to back and chest.  Workup revealed esophageal tear.  Noted prior EGD during cardiac arrest admit showing duodenitis nonspecific requiring hemospray, erosive gastritis but normal esophagus.  An esophageal stent was placed on 10/7 and then a subsequent swallow study revealed an additional leak.  He was taken back to the OR for EGD and stent repositioning on 10/15 and was hypoxic with gastric contents noted on intubation.  Pt was too hypoxic for extubation post-procedure, so the patient was left intubated and PCCM consulted and the patient transferred to intensive care  Pertinent  Medical History  HFrEf Afib on [facility]   Significant Hospital Events: Including procedures, antibiotic start and stop dates in addition to other pertinent events   10/5 admit 10/7 esophageal stent 10/15 EGD and stent repositioning, likely aspiration and left intubated  10/16  successfully extubated  Interim History / Subjective:  Reported abdominal discomfort and nausea  Objective    Blood pressure (!) 176/159, pulse (!) 102, temperature (!) 100.9 F (38.3 C), temperature source Axillary, resp. rate (!) 33, height 6' 1 (1.854 m), weight 83.5 kg, SpO2 97%. CVP:  [6 mmHg-11 mmHg] 11 mmHg  Vent Mode: PSV;CPAP FiO2 (%):  [40 %-60 %] 40 % Set Rate:  [18 bmp-20 bmp] 20 bmp Vt Set:  [640 mL] 640 mL PEEP:  [5 cmH20] 5 cmH20 Pressure Support:  [10 cmH20] 10 cmH20 Plateau Pressure:  [22 cmH20-23 cmH20] 23 cmH20   Intake/Output Summary (Last 24 hours) at 02/01/2024 0851 Last data  filed at 02/01/2024 0700 Gross per 24 hour  Intake 3817.58 ml  Output 300 ml  Net 3517.58 ml   Filed Weights   01/30/24 0500 01/31/24 1246 02/01/24 0412  Weight: 80.6 kg 80.6 kg 83.5 kg    General 73 year old male patient resting in bed no acute distress currently.  Did report some nausea and abdominal discomfort which resolved with Zofran  and low-dose Dilaudid  HEENT normocephalic atraumatic no JVD.  Endotracheal tube in place Pulmonary decreased bilaterally.  F/VT in the mid 60s with tidal volume in the 400s rate initially around 30, however dropped down to mid 20s post adequate analgesic support.  Now successfully extubated on nasal cannula pulse oximetry 94% accessory use. Portable chest x-ray personally Reviewed showed postoperative chest x-ray from the 16th left lower lobe airspace disease with patchy changes, probable small left effusion Cardiac irregular irregular.  Atrial fibrillation on telemetry Extremities cool, palpable pulses.  Trace lower extremity edema.  Appears a little mottled Abdomen hypoactive bowel sounds.  Soft.  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  If significant effusion probably needs a small bore chest tube.  If there is sig effusion will d/w thoracic surg prior to intervention  PRN analgesia  Post operative nausea Plan PRN zofran    Post-op Ventilator management Passed SBT  Plan Extubate Wean O2 for sats >92% Mobilize as able  Cont pulse ox  Pulm hygiene (Spiro and flutter)  Aspiration PNA and small left effusion  -in on-going esophageal leak worry about contaminated pleural space Plan CXR today   Repeat US  left chest as discussed above Day 3 zosyn , fluconazole has been on since 10/6, stopped 15th, resumed 16th. 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 Some concern about erroneous  lab data .  Plan Repeat chem now   Hyperglycemia and elevated triglycerides. Isolated glucose > 300. Concern about location and accuracy of lab draw and could this reflect his TPN etc. This glucose would explain the hyponatremia worsening Plan Repeat chem now as well as triglycerides  If triglycerides are indeed elevated will check Lipase Already stopped propofol  as extubated  Start CBG q4   PAF and history of DVT/PE Plan Tele  Rate control w/ amio  IV heparin   NPO so no DOAC   HFrEF w/ h/o Hypertension; complicated by drug related hypotension.  Required Norepi for MAP on sedation. Post removal of sedation no longer NE dependent Plan Cont tele  Holding entresto , demadex   and statin      Critical care time: 55 min      CRITICAL CARE Performed by: [name] Banner

## 2024-02-02 ENCOUNTER — Inpatient Hospital Stay (HOSPITAL_COMMUNITY)

## 2024-02-02 DIAGNOSIS — I5032 Chronic diastolic (congestive) heart failure: Secondary | ICD-10-CM | POA: Diagnosis not present

## 2024-02-02 DIAGNOSIS — K223 Perforation of esophagus: Secondary | ICD-10-CM | POA: Diagnosis not present

## 2024-02-02 DIAGNOSIS — I5033 Acute on chronic diastolic (congestive) heart failure: Secondary | ICD-10-CM

## 2024-02-02 DIAGNOSIS — N179 Acute kidney failure, unspecified: Secondary | ICD-10-CM | POA: Diagnosis not present

## 2024-02-02 DIAGNOSIS — I4891 Unspecified atrial fibrillation: Secondary | ICD-10-CM | POA: Diagnosis not present

## 2024-02-02 DIAGNOSIS — N1832 Chronic kidney disease, stage 3b: Secondary | ICD-10-CM | POA: Diagnosis not present

## 2024-02-02 LAB — ECHOCARDIOGRAM LIMITED
Height: 73 in
S' Lateral: 3.2 cm
Weight: 2955.93 [oz_av]

## 2024-02-02 LAB — COMPREHENSIVE METABOLIC PANEL WITH GFR
ALT: 35 U/L (ref 0–44)
AST: 30 U/L (ref 15–41)
Albumin: <1.5 g/dL — ABNORMAL LOW (ref 3.5–5.0)
Alkaline Phosphatase: 153 U/L — ABNORMAL HIGH (ref 38–126)
Anion gap: 13 (ref 5–15)
BUN: 107 mg/dL — ABNORMAL HIGH (ref 8–23)
CO2: 22 mmol/L (ref 22–32)
Calcium: 8.4 mg/dL — ABNORMAL LOW (ref 8.9–10.3)
Chloride: 100 mmol/L (ref 98–111)
Creatinine, Ser: 2.42 mg/dL — ABNORMAL HIGH (ref 0.61–1.24)
GFR, Estimated: 28 mL/min — ABNORMAL LOW (ref >60)
Glucose, Bld: 148 mg/dL — ABNORMAL HIGH (ref 70–99)
Potassium: 4 mmol/L (ref 3.5–5.1)
Sodium: 135 mmol/L (ref 135–145)
Total Bilirubin: 0.4 mg/dL (ref 0.0–1.2)
Total Protein: 5.6 g/dL — ABNORMAL LOW (ref 6.5–8.1)

## 2024-02-02 LAB — POCT I-STAT 7, (LYTES, BLD GAS, ICA,H+H)
Acid-base deficit: 7 mmol/L — ABNORMAL HIGH (ref 0.0–2.0)
Acid-base deficit: 8 mmol/L — ABNORMAL HIGH (ref 0.0–2.0)
Bicarbonate: 22.8 mmol/L (ref 20.0–28.0)
Bicarbonate: 23.6 mmol/L (ref 20.0–28.0)
Calcium, Ion: 1.26 mmol/L (ref 1.15–1.40)
Calcium, Ion: 1.27 mmol/L (ref 1.15–1.40)
HCT: 30 % — ABNORMAL LOW (ref 39.0–52.0)
HCT: 31 % — ABNORMAL LOW (ref 39.0–52.0)
Hemoglobin: 10.2 g/dL — ABNORMAL LOW (ref 13.0–17.0)
Hemoglobin: 10.5 g/dL — ABNORMAL LOW (ref 13.0–17.0)
O2 Saturation: 57 %
O2 Saturation: 98 %
Potassium: 4.8 mmol/L (ref 3.5–5.1)
Potassium: 5.2 mmol/L — ABNORMAL HIGH (ref 3.5–5.1)
Sodium: 136 mmol/L (ref 135–145)
Sodium: 137 mmol/L (ref 135–145)
TCO2: 25 mmol/L (ref 22–32)
TCO2: 26 mmol/L (ref 22–32)
pCO2 arterial: 75.9 mmHg (ref 32–48)
pCO2 arterial: 77.8 mmHg (ref 32–48)
pH, Arterial: 7.086 — CL (ref 7.35–7.45)
pH, Arterial: 7.089 — CL (ref 7.35–7.45)
pO2, Arterial: 141 mmHg — ABNORMAL HIGH (ref 83–108)
pO2, Arterial: 42 mmHg — ABNORMAL LOW (ref 83–108)

## 2024-02-02 LAB — COOXEMETRY PANEL
Carboxyhemoglobin: 0.9 % (ref 0.5–1.5)
Methemoglobin: <0.7 % (ref 0.0–1.5)
O2 Saturation: 64.3 %
Total hemoglobin: 8.3 g/dL — ABNORMAL LOW (ref 12.0–16.0)

## 2024-02-02 LAB — HEPARIN LEVEL (UNFRACTIONATED)
Heparin Unfractionated: <0.1 [IU]/mL — ABNORMAL LOW (ref 0.30–0.70)
Heparin Unfractionated: <0.1 [IU]/mL — ABNORMAL LOW (ref 0.30–0.70)
Heparin Unfractionated: 0.13 [IU]/mL — ABNORMAL LOW (ref 0.30–0.70)

## 2024-02-02 LAB — CBC
HCT: 29.7 % — ABNORMAL LOW (ref 39.0–52.0)
Hemoglobin: 9.5 g/dL — ABNORMAL LOW (ref 13.0–17.0)
MCH: 27.5 pg (ref 26.0–34.0)
MCHC: 32 g/dL (ref 30.0–36.0)
MCV: 85.8 fL (ref 80.0–100.0)
Platelets: 540 K/uL — ABNORMAL HIGH (ref 150–400)
RBC: 3.46 MIL/uL — ABNORMAL LOW (ref 4.22–5.81)
RDW: 17.6 % — ABNORMAL HIGH (ref 11.5–15.5)
WBC: 18.8 K/uL — ABNORMAL HIGH (ref 4.0–10.5)
nRBC: 0 % (ref 0.0–0.2)

## 2024-02-02 LAB — GLUCOSE, CAPILLARY
Glucose-Capillary: 142 mg/dL — ABNORMAL HIGH (ref 70–99)
Glucose-Capillary: 147 mg/dL — ABNORMAL HIGH (ref 70–99)
Glucose-Capillary: 147 mg/dL — ABNORMAL HIGH (ref 70–99)
Glucose-Capillary: 168 mg/dL — ABNORMAL HIGH (ref 70–99)
Glucose-Capillary: 125 mg/dL — ABNORMAL HIGH (ref 70–99)
Glucose-Capillary: 136 mg/dL — ABNORMAL HIGH (ref 70–99)

## 2024-02-02 LAB — TRIGLYCERIDES: Triglycerides: 42 mg/dL (ref <150)

## 2024-02-02 MED ORDER — OXYCODONE HCL 5 MG PO TABS
5.0000 mg | ORAL_TABLET | ORAL | Status: DC | PRN
  Administered 2024-02-02 – 2024-02-03 (×6): 5 mg via ORAL
  Filled 2024-02-02 (×5): qty 1

## 2024-02-02 MED ORDER — KETAMINE BOLUS VIA INFUSION
0.2500 mg/kg | Freq: Once | INTRAVENOUS | Status: AC
  Administered 2024-02-02: 20.95 mg via INTRAVENOUS
  Filled 2024-02-02: qty 25

## 2024-02-02 MED ORDER — ACETAMINOPHEN 325 MG PO TABS: 650.0000 mg | ORAL_TABLET | Freq: Four times a day (QID) | ORAL | Status: DC | PRN

## 2024-02-02 MED ORDER — OXYCODONE HCL 5 MG PO TABS
5.0000 mg | ORAL_TABLET | Freq: Four times a day (QID) | ORAL | Status: DC
  Administered 2024-02-02 – 2024-02-04 (×7): 5 mg via ORAL
  Filled 2024-02-02 (×9): qty 1

## 2024-02-02 MED ORDER — HYDROMORPHONE HCL 1 MG/ML IJ SOLN
0.5000 mg | INTRAMUSCULAR | Status: DC | PRN
  Administered 2024-02-02 – 2024-02-03 (×5): 0.5 mg via INTRAVENOUS
  Filled 2024-02-02 (×4): qty 0.5

## 2024-02-02 MED ORDER — FUROSEMIDE 10 MG/ML IJ SOLN
40.0000 mg | Freq: Once | INTRAMUSCULAR | Status: AC
  Administered 2024-02-02: 40 mg via INTRAVENOUS
  Filled 2024-02-02: qty 4

## 2024-02-02 MED ORDER — KETAMINE HCL-SODIUM CHLORIDE 1000-0.69 MG/100ML-% IV SOLN
0.3000 mg/kg/h | INTRAVENOUS | Status: DC
  Administered 2024-02-02: 0.3 mg/kg/h via INTRAVENOUS
  Administered 2024-02-03: 0.5 mg/kg/h via INTRAVENOUS
  Filled 2024-02-02 (×2): qty 100

## 2024-02-02 MED ORDER — TRACE MINERALS CU-MN-SE-ZN 300-55-60-3000 MCG/ML IV SOLN
INTRAVENOUS | Status: AC
  Filled 2024-02-02: qty 761.6

## 2024-02-02 MED ORDER — LEVALBUTEROL HCL 0.63 MG/3ML IN NEBU
0.6300 mg | INHALATION_SOLUTION | Freq: Four times a day (QID) | RESPIRATORY_TRACT | Status: AC | PRN
  Administered 2024-02-02 – 2024-03-26 (×4): 0.63 mg via RESPIRATORY_TRACT
  Filled 2024-02-02 (×6): qty 3

## 2024-02-02 NOTE — Progress Notes (Signed)
     [address]       [name] [address]             [phone]       Stable overnight CT in place with return of Ok for clears and crushed meds  Loyd Salvador MALVA Rayas

## 2024-02-02 NOTE — Progress Notes (Signed)
 Physical Therapy Treatment Patient Details Name: Billy [name] MRN: [id number] DOB: 1950/07/19 Today's Date: 02/02/2024   History of Present Illness Billy [name] is a 73 y.o. male who presented to [facility] ED 01/21/24 with several days of vomiting followed by an episode of severe tearing epigastric pain. Work-up revealed esophageal tear. Pt s/p esophageal stent with EGD 10/7. Return to OR for esophageal stent adjustment on 10/15, remained intubated after procedure due to suspected aspiration. Extubated 10/16. Pt had CT placed 10/16. PMHx: HFrEF, AFib, CAD, PE complicated by cardiogenic shock, CKD 4, MI, peripheral neuropathy, and TIA.    PT Comments  Pt admitted with above diagnosis. Goals revised as pt has had medical complications since goals set.  Pt needing mod assist of 2 to transfer to chair from bed today.  Given pts medical issues, recommend post acute rehab > 3 hours day prior to d/c home as pt will benefit from therapy prior to going home. Will continue to follow acutely. Pt currently with functional limitations due to the deficits listed below (see PT Problem List). Pt will benefit from acute skilled PT to increase their independence and safety with mobility to allow discharge.       If plan is discharge home, recommend the following: Assistance with cooking/housework;Assist for transportation;Help with stairs or ramp for entrance;A lot of help with walking and/or transfers;A lot of help with bathing/dressing/bathroom   Can travel by private vehicle        Equipment Recommendations  Wheelchair (measurements PT);Wheelchair cushion (measurements PT)    Recommendations for Other Services       Precautions / Restrictions Precautions Precautions: Fall Recall of Precautions/Restrictions: Impaired Precaution/Restrictions Comments: poor O2 signal on fingers; reads better on his forehead; chest tube in place Restrictions Weight Bearing Restrictions Per Provider Order: No     Mobility  Bed  Mobility Overal bed mobility: Needs Assistance Bed Mobility: Supine to Sit, Sit to Supine Rolling: Min assist Sidelying to sit: HOB elevated, Mod assist       General bed mobility comments: pt bringing LE to EOB without assist, min assist with pt stating he needed some help to lift trunk but required Mod A to scoot hips to EOB.    Transfers Overall transfer level: Needs assistance Equipment used: 2 person hand held assist Transfers: Sit to/from Stand Sit to Stand: Mod assist, +2 physical assistance, +2 safety/equipment   Step pivot transfers: +2 safety/equipment, Mod assist       General transfer comment: Mod A x 2 with tactile assist to place UE on therapist's arms. Mod assist of 2 to step pivot to recliner with pt with flexed posture and pt could not stand fully upright even with cues. Pt able to take pivotal steps needing incr cues for safety and definite need of bil UE support.    Ambulation/Gait                   Stairs             Wheelchair Mobility     Tilt Bed    Modified Rankin (Stroke Patients Only)       Balance Overall balance assessment: Needs assistance Sitting-balance support: No upper extremity supported, Feet supported Sitting balance-Leahy Scale: Fair Sitting balance - Comments: Pt sat EOB with CGA.   Standing balance support: During functional activity, Reliant on assistive device for balance, Single extremity supported, Bilateral upper extremity supported Standing balance-Leahy Scale: Poor Standing balance comment: poor unsupported with need for bil UE support  and mod assist of 2 for static/dynamic standing                            Communication Communication Communication: Impaired Factors Affecting Communication: Difficulty expressing self  Cognition Arousal: Alert Behavior During Therapy: Restless, Impulsive   PT - Cognitive impairments: Difficult to assess, No family/caregiver present to determine baseline,  Initiation, Sequencing, Problem solving, Safety/Judgement Difficult to assess due to: Impaired communication                     PT - Cognition Comments: Pt often requesting more time, at times needing repetition of questions; unclear if due to pt hard of hearing or slow processing Following commands: Impaired Following commands impaired: Follows one step commands inconsistently, Follows one step commands with increased time    Cueing Cueing Techniques: Verbal cues, Gestural cues  Exercises General Exercises - Lower Extremity Long Arc Quad: AROM, Both, 10 reps, Seated Hip Flexion/Marching: AROM, Both, 10 reps, Seated    General Comments General comments (skin integrity, edema, etc.): Pt on 4LO2 on arrival and nurse incr to 5L with activity to keep sats > 88%.  VSS with transfer      Pertinent Vitals/Pain Pain Assessment Pain Assessment: Faces Faces Pain Scale: Hurts even more Pain Location: back Pain Descriptors / Indicators: Grimacing, Guarding Pain Intervention(s): Limited activity within patient's tolerance, Monitored during session, Repositioned    Home Living                          Prior Function            PT Goals (current goals can now be found in the care plan section) Acute Rehab PT Goals Patient Stated Goal: Return Home PT Goal Formulation: With patient Time For Goal Achievement: 02/16/24 Potential to Achieve Goals: Fair Progress towards PT goals: Goals downgraded-see care plan    Frequency    Min 3X/week      PT Plan      Co-evaluation              AM-PAC PT 6 Clicks Mobility   Outcome Measure  Help needed turning from your back to your side while in a flat bed without using bedrails?: A Little Help needed moving from lying on your back to sitting on the side of a flat bed without using bedrails?: A Lot Help needed moving to and from a bed to a chair (including a wheelchair)?: Total Help needed standing up from a chair  using your arms (e.g., wheelchair or bedside chair)?: Total Help needed to walk in hospital room?: Total Help needed climbing 3-5 steps with a railing? : Total 6 Click Score: 9    End of Session Equipment Utilized During Treatment: Oxygen  Activity Tolerance: Patient limited by fatigue;Patient limited by pain Patient left: with call bell/phone within reach;in chair;with chair alarm set Nurse Communication: Mobility status PT Visit Diagnosis: Difficulty in walking, not elsewhere classified (R26.2);Unsteadiness on feet (R26.81)     Time: 9047-8984 PT Time Calculation (min) (ACUTE ONLY): 23 min  Charges:    $Therapeutic Activity: 23-37 mins PT General Charges $$ ACUTE PT VISIT: 1 Visit                     Zylon Creamer M,PT Acute Rehab Services [phone]    Stephane JULIANNA Bevel 02/02/2024, 12:14 PM

## 2024-02-02 NOTE — Progress Notes (Signed)
 Increased Ketamine to 0.5mg /kg/hr earlier as pt still requiring PRNs and reporting sub-optimal pain control .  Trying to avoid increased narcotics Plan Cont the ketamine at 0.5 mcg/kg/min (would avoid higher dosing to ensure we target pain and not dissociation)  Cont the scheduled Oxy, lidocaine  patch and PRN dilaudid   Cont to wean dex to off Depending on renal fxn might consider low dose Neurontin  tomorrow Ideally start working towards changing scheduled oxy to PRN over next 36 hrs or so

## 2024-02-02 NOTE — Progress Notes (Signed)
 NAME:  Billy [name], MRN:  [id number], DOB:  24-Feb-1951, LOS: 12 ADMISSION DATE:  01/21/2024, CONSULTATION DATE:  01/21/24 REFERRING MD:  EDP, CHIEF COMPLAINT:  chest pain   History of Present Illness:  Billy [name] is a 73 year old man known to our service with cardiac arrest earlier this year along with HFrEF, Afib, PE who is presenting with several days of N/V from a stomach bug followed by sudden onset severe tearing epigastric pain radiating to back and chest.  Workup revealed esophageal tear.  Noted prior EGD during cardiac arrest admit showing duodenitis nonspecific requiring hemospray, erosive gastritis but normal esophagus.  An esophageal stent was placed on 10/7 and then a subsequent swallow study revealed an additional leak.  He was taken back to the OR for EGD and stent repositioning on 10/15 and was hypoxic with gastric contents noted on intubation.  Pt was too hypoxic for extubation post-procedure, so the patient was left intubated and PCCM consulted and the patient transferred to intensive care  Pertinent  Medical History  HFrEf Afib on [facility]   Significant Hospital Events: Including procedures, antibiotic start and stop dates in addition to other pertinent events   10/5 admit 10/7 esophageal stent 10/15 EGD and stent repositioning, likely aspiration and left intubated  10/16  successfully extubated. POCUS right effusion. Small bore CT placed. ~300 ml; gm stain neg, exudate by light's criteria. Lots of challenge w/ pain. Getting Dilaudid  and dex gtt.  10/17 marked pain still. Changing med regimen to ketamine w/ scheduled orals and decreased freq of dilaudid    Interim History / Subjective:  Total out CT  Still having significant pain. Particularly chest/back   Objective    Blood pressure (!) 111/57, pulse 86, temperature 98.9 F (37.2 C), temperature source Oral, resp. rate (!) 27, height 6' 1 (1.854 m), weight 83.8 kg, SpO2 98%. CVP:  [0 mmHg-11 mmHg] 7 mmHg  Vent Mode:  PSV;CPAP FiO2 (%):  [40 %] 40 % PEEP:  [5 cmH20] 5 cmH20 Pressure Support:  [10 cmH20] 10 cmH20   Intake/Output Summary (Last 24 hours) at 02/02/2024 0729 Last data filed at 02/02/2024 0700 Gross per 24 hour  Intake 3307.55 ml  Output 2450 ml  Net 857.55 ml   Filed Weights   01/31/24 1246 02/01/24 0412 02/02/24 0600  Weight: 80.6 kg 83.5 kg 83.8 kg  General this is a 73 year old male who is s/p esophageal stent still has sig pain needs HENT NCAT no JVD  Pulm dec bases. Right CT drained about 350 ml no airleak concentrated yellow. Still on 4 lpm PCXR from 10/16 post procedure: ett out. Right CT in good position. Improved aeration. Left atx Card RRR no MRG Abd soft Ext warm trace LE edema Neuro slow to respond but oriented. Pain clearly contributing to cognition. 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  Day  4 zosyn ; fluconazole started 10/6; length of therapy TBD; likely continue until we know leak is sealed  Mobilize Keep CT to sxn today  Am CXR  F/u cultures and cytology   Acute hypoxic respiratory failure 2/2 Aspiration PNA  -currently on 4 lpm: O2 sats: 94 Plan Am cxr Pulm hygiene to include: flutter, IS and mobilization Also focus on pain rx as likely contributing to atelectasis  F/u resp culture   Sedation related  Hypotension Clear relationship to narcotic and sedating med needs and impact on  BP Plan Norepi. 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  Plan Ensure MAP >65 Ensure he is euvolemic Renal dose meds Strict I&O F/u today's pending chem and order one for AM   Hyperglycemia  Plan Cont to trend Add ssi if two serial checks > 180  Mildly elevated Alk PO4 Plan Trend  If cont to climb may need to consider abd RUQUS  PAF and history of DVT/PE Plan Tele  Rate control w/ amio  IV heparin   NPO so no DOAC   HFrEF w/ h/o Hypertension; complicated by drug related hypotension.  Required Norepi for MAP on sedation. Post removal of sedation no longer NE dependent Plan Cont tele  Holding entresto , demadex   and statin as he is NPO and just off pressors as of 0700 today      Critical care time:  I personally  spent 45 minutes  on this patient which included: review of medical records, nursing notes, progress notes, evaluation, interpretation of lab data and diagnostic studies, taking independent history, performing exam, documenting plan, ordering diagnostics and interventions for the following critical care issues: Acute respiratory failure, Severe metabolic derangements with the following interventions which included: evaluation of life threatening infection and determining appropriate pharmaceutical intervention , evaluation and management of acute renal failure, post operative complication      CRITICAL CARE Performed by: [name] Banner

## 2024-02-02 NOTE — Progress Notes (Signed)
  Echocardiogram 2D Echocardiogram has been performed.  Koleen KANDICE Popper, RDCS 02/02/2024, 8:20 AM

## 2024-02-02 NOTE — Progress Notes (Addendum)
 Patient ID: Billy [name], male   DOB: Nov 21, 1950, 73 y.o.   MRN: [id number]     Advanced Heart Failure Rounding Note  Cardiologist: Dorn Lesches, MD   Chief Complaint: S/P esophageal stent  Subjective:    S/p EGD with stent repositioning by Dr. Shyrl 10/15.   Uncomfortable. Complaining of pain. SBPs 140s.  Limited echo yesterday EF 50-55%, RV mildly reduced  Co-ox 64%, CVP 6-8.   SCr 2.6>>2.4   Objective:    Weight Range: 83.8 kg Body mass index is 24.37 kg/m.   Vital Signs:   Temp:  [98.4 F (36.9 C)-99.3 F (37.4 C)] 98.9 F (37.2 C) (10/17 1115) Pulse Rate:  [57-100] 76 (10/17 0815) Resp:  [18-37] 21 (10/17 0815) BP: (79-154)/(38-105) 101/55 (10/17 0815) SpO2:  [90 %-100 %] 96 % (10/17 0815) Weight:  [83.8 kg] 83.8 kg (10/17 0600) Last BM Date : 01/30/24  Weight change: Filed Weights   01/31/24 1246 02/01/24 0412 02/02/24 0600  Weight: 80.6 kg 83.5 kg 83.8 kg   Intake/Output:  Intake/Output Summary (Last 24 hours) at 02/02/2024 1134 Last data filed at 02/02/2024 0700 Gross per 24 hour  Intake 2706.5 ml  Output 1700 ml  Net 1006.5 ml    Physical Exam   CVP 6  GENERAL: fatigued appearing, no respiratory difficulty  Lungs- diminished BS at the bases bilaterally  CARDIAC:  JVP not elevated, irregularly irregular rhythm and rate  ABDOMEN: Soft, non-tender, non-distended.  EXTREMITIES: Warm and well perfused. No LEE  NEUROLOGIC: No obvious FND   Telemetry   Afib 80s, personally reviewed   Labs    CBC Recent Labs    02/01/24 0430 02/02/24 0229  WBC 16.1* 18.8*  HGB 8.3* 9.5*  HCT 24.0* 29.7*  MCV 87.3 85.8  PLT 490* 540*   Basic Metabolic Panel Recent Labs    89/83/74 0540 02/01/24 0936 02/02/24 0851  NA 127* 134* 135  K 4.3 4.2 4.0  CL 97* 100 100  CO2 19* 20* 22  GLUCOSE 316* 160* 148*  BUN 95* 97* 107*  CREATININE 2.26* 2.57* 2.42*  CALCIUM  7.6* 8.3* 8.4*  MG 2.7* 2.5*  --   PHOS 3.9 4.1  --    Liver Function  Tests Recent Labs    02/01/24 0936 02/02/24 0851  AST 38 30  ALT 45* 35  ALKPHOS 183* 153*  BILITOT 0.7 0.4  PROT 6.1* 5.6*  ALBUMIN  1.5* <1.5*   BNP (last 3 results) Recent Labs    03/09/23 1544 03/11/23 0218 06/27/23 1116  BNP 660.9* 491.3* 646.4*   Hemoglobin A1C No results for input(s): HGBA1C in the last 72 hours.  Fasting Lipid Panel Recent Labs    02/02/24 0229  TRIG 42    Medications:    Scheduled Medications:  bisacodyl   10 mg Rectal Q0600   Chlorhexidine  Gluconate Cloth  6 each Topical Daily   lidocaine   1 patch Transdermal Q24H   mouth rinse  15 mL Mouth Rinse Q2H   oxyCODONE   5 mg Oral Q6H   pantoprazole  (PROTONIX ) IV  40 mg Intravenous QHS   sodium chloride  flush  10 mL Intrapleural Q8H   sodium chloride  flush  10-40 mL Intracatheter Q12H    Infusions:  amiodarone  30 mg/hr (02/02/24 0700)   dexmedetomidine  (PRECEDEX ) IV infusion 0.6 mcg/kg/hr (02/02/24 0938)   fluconazole (DIFLUCAN) IV 200 mg (02/02/24 1021)   heparin  1,500 Units/hr (02/02/24 0700)   ketamine (KETALAR) adult infusion 0.3 mg/kg/hr (02/02/24 1031)   norepinephrine  (LEVOPHED ) Adult infusion Stopped (  02/02/24 0604)   piperacillin -tazobactam (ZOSYN )  IV 12.5 mL/hr at 02/02/24 0700   TPN ADULT (ION) 70 mL/hr at 02/02/24 0700   TPN ADULT (ION)      PRN Medications: acetaminophen , hydrALAZINE , HYDROmorphone  (DILAUDID ) injection, levalbuterol, ondansetron  (ZOFRAN ) IV, mouth rinse, oxyCODONE , sodium chloride  flush  Patient Profile    Patient with longstanding history of chronic heart failure with preserved ejection fraction, RV failure with history of pulmonary embolism presents with esophageal perforation.  S/P Stent Esophageal Stent. Stent repositioned 10/15.  Assessment/Plan  1.  Esophageal Perforation - Boerhaave syndrome.  - CT C/A/P: with pneumomediastinum and large mass-like density in lower esophagus - Prior EGD 3/25: erosive gastritis, duodenal deformity, and bleeding  friable duodenal mucosa s/p hemostatic - 01/23/24 S/P EGD with esophageal stent placed  - Stent migrated w/ leak>>back to OR 10/15 for repositioning  - On TPN - Initially on PO Augmentin & fluconazole. Now w/ ileus. Abx switched to IV Zosyn  given concern for poor absorption. Remains on PO fluconazole. Plan is for total of 6 wks abx+antifungal   - Pain management per CCM    2. Chronic HFpEF: h/o cardiogenic shock with RV failure in the setting of cardiac arrest thought to be caused by acute PE vs ACS in 3/25. Echo at the time showed EF 55%, severe, LVH, G2DD, and severely reduced RV function with strain. There was concern for cardiac sarcoid amyloid, however CMR LGE consistent with ischemic disease. RV on echo 9/25 with complete recovery, EF 60-65%, nl RV function.    - RHC 3/25: Mild PAH with severe RV failure though CO is preserved.   - Repeat Limited Echo 10/17 EF 50-55%, RV mildly reduced  - CVP 6-8, continue to hold diuretics today, anticipate restarting PO tomorrow  - Holding Entresto  and spiro with worsening SCr - GDMT limited by renal function     3. 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 Medical management.  - atorvastatin  80 mg daily.    4.  AKI on CKD Stage 3b: Baseline sCr ~ 2.1. - SCr trending back down 2.6>>2.4 today  - follow BMP     5. PAF:   - s/p TEE/DCCV 3/25 - developed AF with RVR, unsure when as tele is not available. Currently AF w/ CVR  - On IV amiodarone  given illeus/NPO status and poor absorption - He is on heparin  gtt, hgb remaining stable.  - Not candidate for TEE at this point with esophageal perforation and stent placement.  Would aim for DCCV after 4 wks therapeutic anticoagulation if he remains in AF.  6.  H/o DVT/PE:  Bilateral upper and lower DVTs, mixed acute and chronic.  - s/p infrarenal IVC placement 3/25 - V/Q 3/25 with multiple perfusion defects - will need lifelong AC, now on  heparin  gtt.    7. Carotid stenosis: h/o CVA. TCAR in 6/24. Okay to remain off plavix  at this point. - carotid US  9/25: widely patent carotid stent on R and no stenosis on L   8. ID:  PCT was elevated 10/14 at 1.32. being treated for possible PNA. Now on Zosyn . Also w/ ileus. WBC trending down  - continue abx per CCM   Length of Stay: [address], PA-C  02/02/2024, 11:34 AM  Advanced Heart Failure Team Pager [phone] (M-F; 7a - 5p)  Please contact [facility] for night-coverage after hours (5p -7a ) and weekends on [email or web address]  Patient seen  with PA, I formulated the plan and agree with the above note.   Echo yesterday with EF 50-55%, RV mildly dilated with low normal function (stable).  CVP 11 on my read today, co-ox 64%.  Creatinine 2.6 => 2.4 with high BUN.    He remains in AF with controlled rate on heparin  gtt and amiodarone  gtt.   Complains of dyspnea and pleuritic chest pain.   General: NAD Neck: JVP 10 cm, no thyromegaly or thyroid  nodule.  Lungs: Decreased at bases.  CV: Nondisplaced PMI.  Heart irregular S1/S2, no S3/S4, no murmur.  1+ edema to knees bilaterally.   Abdomen: Soft, nontender, no hepatosplenomegaly, no distention.  Skin: Intact without lesions or rashes.  Neurologic: Alert and oriented x 3.  Psych: Normal affect. Extremities: No clubbing or cyanosis.  HEENT: Normal.   I am going to give him Lasix  40 mg IV x 1.  I think he has some volume overload but BUN/creatinine remain high.   Continue amiodarone  gtt and heparin  gtt with persistent AF.    Taking some sips and ice chips now, TPN running.    Antibiotic coverage with Zosyn  and also on fluconazole.   Ezra Shuck 02/02/2024 12:34 PM

## 2024-02-02 NOTE — Progress Notes (Signed)
 PHARMACY - ANTICOAGULATION CONSULT NOTE  Pharmacy Consult for heparin  infusion Indication: atrial fibrillation  Allergies  Allergen Reactions   Norvasc  [Amlodipine ] Swelling and Other (See Comments)    Excessive BLE swelling Skin discoloration, blotching   Jardiance  [Empagliflozin ] Itching and Other (See Comments)    Patient mentioned penile swelling, pain   Morphine And Codeine Itching    Opioid-induced pruritus    Patient Measurements: Height: 6' 1 (185.4 cm) Weight: 83.8 kg (184 lb 11.9 oz) IBW/kg (Calculated) : 79.9 HEPARIN  DW (KG): 80.6  Vital Signs: Temp: 97.8 F (36.6 C) (10/17 1926) Temp Source: Oral (10/17 1926) BP: 103/49 (10/17 2200) Pulse Rate: 90 (10/17 2200)  Labs: Recent Labs    01/31/24 1545 01/31/24 1716 01/31/24 1810 02/01/24 0430 02/01/24 0540 02/01/24 0936 02/01/24 1423 02/02/24 0229 02/02/24 0851 02/02/24 1130 02/02/24 2146  HGB 8.5* 8.2*  --  8.3*  --   --   --  9.5*  --   --   --   HCT 27.5* 24.0*  --  24.0*  --   --   --  29.7*  --   --   --   PLT 442*  --   --  490*  --   --   --  540*  --   --   --   HEPARINUNFRC  --   --    < > <0.10*  --   --    < > <0.10*  --  <0.10* 0.13*  CREATININE 2.50*  --   --   --  2.26* 2.57*  --   --  2.42*  --   --    < > = values in this interval not displayed.    Estimated Creatinine Clearance: 31.6 mL/min (A) (by C-G formula based on SCr of 2.42 mg/dL (H)).   Assessment: 73 yo M presenting for esophageal rupture, now s/p esophageal stent placement on 10/6. PMH significant for HFrEF, Afib on Eliquis  PTA. Last dose of Eliquis  01/20/24, time unknown. Pharmacy consulted for heparin  management.  10/15 - underwent EGD with stent reposition on 10/15 after finding esophageal leak  Heparin  level 0.13 is subtherapeutic on 1700 units/hr.  No issues with infusion or bleeding per RN.  Goal of Therapy:  Heparin  level 0.3-0.5 units/ml Monitor platelets by anticoagulation protocol: Yes   Plan:  Increase  heparin  infusion to 1900 units/hr Monitor heparin  level, CBC, and s/sx of bleeding daily  Thank you for allowing pharmacy to participate in this patient's care,   Jinnie Door, PharmD, BCPS, [facility] Clinical Pharmacist  Please check AMION for all [facility] Pharmacy phone numbers After 10:00 PM, call Main Pharmacy [phone]

## 2024-02-02 NOTE — Progress Notes (Signed)
 Nutrition Follow-up  DOCUMENTATION CODES:   Non-severe (moderate) malnutrition in context of acute illness/injury  INTERVENTION:   Continue TPN to meet 100% estimated nutritional needs -Estimated energy needs adjusted today, recommend adjusting to meet increased calorie needs with tomorrow's TPN  NUTRITION DIAGNOSIS:   Moderate Malnutrition related to acute illness (may have chronic component due to time frame of reported wt loss) as evidenced by mild fat depletion, percent weight loss, energy intake < or equal to 50% for > or equal to 5 days.  Being addressed via TPN  GOAL:   Patient will meet greater than or equal to 90% of their needs  Met via TPN  MONITOR:   Diet advancement, I & O's, Labs, Weight trends (TPN)  REASON FOR ASSESSMENT:   Consult, Rounds New TPN/TNA  ASSESSMENT:   73 yo male admitted post several days of N/V followed by sudden onset of severe epigastric pain radiating to chest and back. Pt found to have small esophageal perforation above GE junction. PMH includes CAD, chronic HFpEF, HTN, HLD, DVT, CVA, PAF, CKD 3b.  10/05 Admitted 10/06 Esophagogram/barium study: distal esophageal perforation, TPN started 10/07 S/p Esophageal stent, TPN at goal rate of 80 ml/hr 10/08  CLD 10/10 Diet advanced to Dysphagia 3, Calorie Count 10/13 No meal tickets for calorie count, pt reports eating bites only 10/15 Esophageal stent migration with leak on esophagram with return to OR for stent repositioning, likely aspiration event requiring intubation 10/16 Extubated, Chest tube placed  Remains NPO +pain, started on Ketamine drip Levophed  at 3, remains on precedex , amio and heparin  gtt   TPN at 70 ml/hr providing 2100 kcals and 114 g of protein  Creatinine down to 2.4 from 2.6 but BUN >100. Noted BUN has been in 90s range since 10/15. Baseline  Creatinine 2.1 per MD notes TPN has been at goal since 10/07, BUN 50s since admission until 10/12  Chest Tube: 350 mL in  24 hours UOP: 2100 mL in 24 hours, 400 mL so far today  Current Wt: 83.8 kg Admission Wt: 72.8 kg +edema in BLE per RN assessment  Labs: BUN 107 Creatinine 2.42  Sodium 135 (wdl) Potassium 4.0 (wdl) Alk Phos 153 (H) Phosphorus 4.1 (wdl) Magnesium  2.5 (H) CBGs 126-158   Meds: Lasix  x 1 today   Diet Order:   Diet Order             Diet NPO time specified  Diet effective now                   EDUCATION NEEDS:   Education needs have been addressed  Skin:  Skin Assessment: Reviewed RN Assessment  Last BM:  10/14  Height:   Ht Readings from Last 1 Encounters:  01/31/24 6' 1 (1.854 m)    Weight:   Wt Readings from Last 1 Encounters:  02/02/24 83.8 kg      BMI:  Body mass index is 24.37 kg/m.  Estimated Nutritional Needs:   Kcal:  2300-2500 kcals  Protein:  115-130 g  Fluid:  2L   Betsey Finger MS, RDN, LDN, CNSC Registered Dietitian 3 Clinical Nutrition RD Inpatient Contact Info in Amion

## 2024-02-02 NOTE — TOC Progression Note (Incomplete Revision)
 Transition of Care [facility]) - Progression Note    Patient Details  Name: Billy [name] MRN: [id number] Date of Birth: [birth year]  Transition of Care [facility]) CM/SW Contact  Justina Delcia Czar, RN Phone Number: [phone] 02/02/2024, 12:37 PM  Clinical Narrative:    Received message from PT and recommendation is IP rehab. Message sent to attending for consult.   Chart reviewed for discharge readiness, patient not medically stable for d/c continues on heparin  gtt, IV abx and TPN. [employer] rep, Pam RN is follow for possible Home IV abx. Will need OPAT orders. [facility] arranged with [facility] for [facility]. Will need HH orders with F2F.      Inpatient CM/CSW will continue to monitor pt's advancement through interdisciplinary progression rounds.    If new pt transition needs arise, MD please place a TOC consult.     Expected Discharge Plan: Home/Self Care Barriers to Discharge: Continued Medical Work up    Expected Discharge Plan and Services   Discharge Planning Services: CM Consult   Living arrangements for the past 2 months: Single Family Home                    Social Drivers of Health (SDOH) Interventions SDOH Screenings   Food Insecurity: No Food Insecurity (01/22/2024)  Housing: High Risk (01/22/2024)  Transportation Needs: No Transportation Needs (01/22/2024)  Utilities: Not At Risk (01/22/2024)  Depression (PHQ2-9): Low Risk  (12/01/2020)  Social Connections: Socially Isolated (01/22/2024)  Tobacco Use: Low Risk  (01/31/2024)    Readmission Risk Interventions    06/27/2023    1:52 PM 10/14/2022   11:29 AM  Readmission Risk Prevention Plan  Post Dischage Appt  Complete  Medication Screening  Complete  Transportation Screening Complete Complete  HRI or Home Care Consult Complete   Social Work Consult for Recovery Care Planning/Counseling Complete   Palliative Care Screening Not Applicable   Medication Review Oceanographer) Referral to Pharmacy

## 2024-02-02 NOTE — Progress Notes (Addendum)
 PHARMACY - TOTAL PARENTERAL NUTRITION CONSULT NOTE   Indication: intolerance to enteral feedings, esophageal perforation  Patient Measurements: Height: 6' 1 (185.4 cm) Weight: 83.8 kg (184 lb 11.9 oz) IBW/kg (Calculated) : 79.9 TPN AdjBW (KG): 80.6 Body mass index is 24.37 kg/m.  Assessment:  73 year old man with medical history significant for cardiac arrest earlier this year who was admitted after several days of nausea and vomiting followed by sudden onset severe tearing epigastric pain radiating to back and chest. 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 Pharmacy consulted to initiate TPN due to inability to tolerate enteral nutrition at this time.  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 PTA takes torsemide  20 mg PO daily, now held. Aggressive fluid resuscitation recommended per GI. Per HF, patient currently euvolemic but will hold diuretic for now. Due to CHF will target lower end of fluid range for TPN and supplement additional fluids per MD outside of TPN to prevent fluid overload.   Stent placed on 10/6, esophogram not showing any leak. Diet advanced to CLD. 10/15 Leak present and made NPO.  Glucose / Insulin : A1C 6.0% (02/2023) ;  BGs 90-160, 12 units insulin  in TPN, no SSI.  Electrolytes: Na 135, K 4, CO2 22, CoCa up ~10.4, Mg 2.5 (none in TPN), others wnl  Renal: Scr 2.42 down (bsl ~2), BUN 107 up  Hepatic: AST/ALT trend down 30 / 35, Alk Phos 153, tbili WNL, alb <1.5, TG 42. Intake / Output; MIVF:  furosemide  IV 80 x2 on 10/14; UOP 1 mL/kg/hr charted, net +11.7 L, LBM 10/14 GI Imaging: 10/15 Esoph XR: esophageal perforation/leak at the distal esophagus, proximal to the esophageal stent 10/14 KUB: Gas-filled loops of small bowel and colon suggestive of ileus 10/7 Esoph XR: No leak associated with stent coverage of esophageal perforation  10/06 Esoph XR: distal esophageal perforation 10/05 CT: concerning for esophageal  perforation, masslike area of soft tissue thickening adjacent to and surrounding the esophagus  GI Surgeries / Procedures:  10/6: esophageal stent placement  10/15 EGD and stent repositioning   Central access: 01/22/24 TPN start date: 01/22/24  Nutritional Goals: Goal TPN rate is 82 mL/hr (provides 110 g of protein and 2099 kcals per day)  Goal concentrated TPN is 70 ml/hr (provides 114 g of protein and 2100 kcals per day  RD Assessment:  Estimated Needs Total Energy Estimated Needs: 2100-2300 kcals Total Protein Estimated Needs: 110-130 g Total Fluid Estimated Needs: 2L  Current Nutrition:  TPN 10/13- 10/14 DYS1: not taking any po  10/15: NPO d/t increased abd distension   Plan:  Continue concentrated TPN at goal 70 mL/hr at 1800 to meet 100% total nutritional needs (provides 114 g of protein and 2100 kcals per day).   Electrolytes in TPN: continue Na 92 mEq/L, decrease K 11 mEq/L, remove Ca, Mg 0 mEq/L, decrease Phos 3 mmol/L. Cl:Ac max acetate Add standard MVI and trace elements to TPN SSI stopped and decrease BG checks to q8hr  Reduce to 8 units insulin  regular to TPN  Monitor TPN labs on Mon/Thurs, and PRN F/u ability to take PO, wean TPN as able   Thank you for involving pharmacy in this patient's care.  Harlene Boga, PharmD, BCPS, BCCCP Clinical Pharmacist Please refer to [facility] for [facility] Pharmacy numbers 02/02/2024 7:03 AM

## 2024-02-02 NOTE — Progress Notes (Signed)
 PHARMACY - ANTICOAGULATION CONSULT NOTE  Pharmacy Consult for heparin  infusion Indication: atrial fibrillation  Allergies  Allergen Reactions   Norvasc  [Amlodipine ] Swelling and Other (See Comments)    Excessive BLE swelling Skin discoloration, blotching   Jardiance  [Empagliflozin ] Itching and Other (See Comments)    Patient mentioned penile swelling, pain   Morphine And Codeine Itching    Opioid-induced pruritus    Patient Measurements: Height: 6' 1 (185.4 cm) Weight: 83.8 kg (184 lb 11.9 oz) IBW/kg (Calculated) : 79.9 HEPARIN  DW (KG): 80.6  Vital Signs: Temp: 98.9 F (37.2 C) (10/17 1115) Temp Source: Axillary (10/17 1115) BP: 101/55 (10/17 0815) Pulse Rate: 76 (10/17 0815)  Labs: Recent Labs    01/31/24 1545 01/31/24 1716 01/31/24 1810 02/01/24 0430 02/01/24 0540 02/01/24 0936 02/01/24 1423 02/02/24 0229 02/02/24 0851  HGB 8.5* 8.2*  --  8.3*  --   --   --  9.5*  --   HCT 27.5* 24.0*  --  24.0*  --   --   --  29.7*  --   PLT 442*  --   --  490*  --   --   --  540*  --   HEPARINUNFRC  --   --    < > <0.10*  --   --  <0.10* <0.10*  --   CREATININE 2.50*  --   --   --  2.26* 2.57*  --   --  2.42*   < > = values in this interval not displayed.    Estimated Creatinine Clearance: 31.6 mL/min (A) (by C-G formula based on SCr of 2.42 mg/dL (H)).   Assessment: 73 yo M presenting for esophageal rupture, now s/p esophageal stent placement on 10/6. PMH significant for HFrEF, Afib on Eliquis  PTA. Last dose of Eliquis  01/20/24, time unknown. Pharmacy consulted for heparin  management.  10/15 - underwent EGD with stent reposition on 10/15 after finding esophageal leak 10/16 - Heparin  briefly paused for chest tube placement, restarted at 1300 units/hr   10/17 - heparin  level remains undetectable after rate increase to 1500 units/hr.  Hgb 9.5, pltc 540 this morning.  No issues.    Goal of Therapy:  Heparin  level 0.3-0.5 units/ml Monitor platelets by anticoagulation  protocol: Yes   Plan:  Increase heparin  infusion at 1700 units/hr Check heparin  level in 8 hours Monitor heparin  level, CBC, and s/sx of bleeding daily  Thank you for allowing pharmacy to participate in this patient's care,  Maurilio Fila, PharmD Clinical Pharmacist 02/02/2024  12:38 PM

## 2024-02-02 NOTE — TOC Progression Note (Signed)
 Transition of Care [facility]) - Progression Note    Patient Details  Name: Billy [name] MRN: [id number] Date of Birth: 1950-11-16  Transition of Care [facility]) CM/SW Contact  Justina Delcia Czar, RN Phone Number: [phone] 02/02/2024, 12:37 PM  Clinical Narrative:    Received message from PT and recommendation is IP rehab. Message sent to attending for consult.   Chart reviewed for discharge readiness, patient not medically stable for d/c continues on heparin  gtt, IV abx and TPN. [employer] rep, Pam RN is follow for possible Home IV abx. Will need OPAT orders. HH arranged with [facility]. Will need HH orders with F2F.      Inpatient CM/CSW will continue to monitor pt's advancement through interdisciplinary progression rounds.    If new pt transition needs arise, MD please place a TOC consult.     Expected Discharge Plan: Home/Self Care Barriers to Discharge: Continued Medical Work up    Expected Discharge Plan and Services   Discharge Planning Services: CM Consult   Living arrangements for the past 2 months: Single Family Home                    Social Drivers of Health (SDOH) Interventions SDOH Screenings   Food Insecurity: No Food Insecurity (01/22/2024)  Housing: High Risk (01/22/2024)  Transportation Needs: No Transportation Needs (01/22/2024)  Utilities: Not At Risk (01/22/2024)  Depression (PHQ2-9): Low Risk  (12/01/2020)  Social Connections: Socially Isolated (01/22/2024)  Tobacco Use: Low Risk  (01/31/2024)    Readmission Risk Interventions    06/27/2023    1:52 PM 10/14/2022   11:29 AM  Readmission Risk Prevention Plan  Post Dischage Appt  Complete  Medication Screening  Complete  Transportation Screening Complete Complete  HRI or Home Care Consult Complete   Social Work Consult for Recovery Care Planning/Counseling Complete   Palliative Care Screening Not Applicable   Medication Review Oceanographer) Referral to Pharmacy

## 2024-02-02 NOTE — Progress Notes (Signed)
 Inpatient Rehab Admissions Coordinator Note:   Per therapy recs patient was screened for CIR candidacy by Reche FORBES Lowers, PT. At this time, pt appears to be a potential candidate for CIR. I will place an order for rehab consult for full assessment, per our protocol.  Please contact me any with questions.SABRA Reche Lowers, PT, DPT [phone] 02/02/24 1:58 PM

## 2024-02-02 NOTE — Progress Notes (Signed)
 PHARMACY - ANTICOAGULATION  Pharmacy Consult for heparin   Indication: atrial fibrillation Brief A/P: Heparin  level subtherapeutic Increase Heparin  rate  Allergies  Allergen Reactions   Norvasc  [Amlodipine ] Swelling and Other (See Comments)    Excessive BLE swelling Skin discoloration, blotching   Jardiance  [Empagliflozin ] Itching and Other (See Comments)    Patient mentioned penile swelling, pain   Morphine And Codeine Itching    Opioid-induced pruritus    Patient Measurements: Height: 6' 1 (185.4 cm) Weight: 83.5 kg (184 lb 1.4 oz) IBW/kg (Calculated) : 79.9 HEPARIN  DW (KG): 80.6  Vital Signs: Temp: 98.9 F (37.2 C) (10/16 2330) Temp Source: Oral (10/16 2330) BP: 140/60 (10/17 0445) Pulse Rate: 78 (10/17 0445)  Labs: Recent Labs    01/30/24 0639 01/30/24 2051 01/31/24 1545 01/31/24 1716 01/31/24 1810 02/01/24 0430 02/01/24 0540 02/01/24 0936 02/01/24 1423 02/02/24 0229  HGB  --    < > 8.5* 8.2*  --  8.3*  --   --   --  9.5*  HCT  --    < > 27.5* 24.0*  --  24.0*  --   --   --  29.7*  PLT  --    < > 442*  --   --  490*  --   --   --  540*  APTT 51*  --   --   --   --   --   --   --   --   --   HEPARINUNFRC 0.25*   < >  --   --    < > <0.10*  --   --  <0.10* <0.10*  CREATININE  --    < > 2.50*  --   --   --  2.26* 2.57*  --   --    < > = values in this interval not displayed.    Estimated Creatinine Clearance: 29.8 mL/min (A) (by C-G formula based on SCr of 2.57 mg/dL (H)).   Assessment: 73 y.o. male s/p EGD 10/15, chest tube 10/16  with h/o Afib, Eliquis  on hold, for heparin   Goal of Therapy:  Heparin  level 0.3-0.5 units/ml Monitor platelets by anticoagulation protocol: Yes   Plan:  Increase Heparin  1500 units/hr Check heparin  level in 8 hours.  Cathlyn Arrant, PharmD, BCPS

## 2024-02-03 DIAGNOSIS — I5032 Chronic diastolic (congestive) heart failure: Secondary | ICD-10-CM | POA: Diagnosis not present

## 2024-02-03 DIAGNOSIS — N1832 Chronic kidney disease, stage 3b: Secondary | ICD-10-CM | POA: Diagnosis not present

## 2024-02-03 DIAGNOSIS — K223 Perforation of esophagus: Secondary | ICD-10-CM | POA: Diagnosis not present

## 2024-02-03 DIAGNOSIS — N179 Acute kidney failure, unspecified: Secondary | ICD-10-CM | POA: Diagnosis not present

## 2024-02-03 DIAGNOSIS — I4891 Unspecified atrial fibrillation: Secondary | ICD-10-CM | POA: Diagnosis not present

## 2024-02-03 LAB — HEPARIN LEVEL (UNFRACTIONATED)
Heparin Unfractionated: 0.29 [IU]/mL — ABNORMAL LOW (ref 0.30–0.70)
Heparin Unfractionated: 0.18 [IU]/mL — ABNORMAL LOW (ref 0.30–0.70)
Heparin Unfractionated: 0.14 [IU]/mL — ABNORMAL LOW (ref 0.30–0.70)

## 2024-02-03 LAB — COOXEMETRY PANEL
Carboxyhemoglobin: 2.3 % — ABNORMAL HIGH (ref 0.5–1.5)
Methemoglobin: 1.1 % (ref 0.0–1.5)
O2 Saturation: 70.3 %
Total hemoglobin: 9.8 g/dL — ABNORMAL LOW (ref 12.0–16.0)

## 2024-02-03 LAB — GLUCOSE, CAPILLARY
Glucose-Capillary: 139 mg/dL — ABNORMAL HIGH (ref 70–99)
Glucose-Capillary: 141 mg/dL — ABNORMAL HIGH (ref 70–99)
Glucose-Capillary: 156 mg/dL — ABNORMAL HIGH (ref 70–99)
Glucose-Capillary: 153 mg/dL — ABNORMAL HIGH (ref 70–99)
Glucose-Capillary: 171 mg/dL — ABNORMAL HIGH (ref 70–99)

## 2024-02-03 LAB — RENAL FUNCTION PANEL
Albumin: <1.5 g/dL — ABNORMAL LOW (ref 3.5–5.0)
Anion gap: 19 — ABNORMAL HIGH (ref 5–15)
BUN: 112 mg/dL — ABNORMAL HIGH (ref 8–23)
CO2: 22 mmol/L (ref 22–32)
Calcium: 7.9 mg/dL — ABNORMAL LOW (ref 8.9–10.3)
Chloride: 91 mmol/L — ABNORMAL LOW (ref 98–111)
Creatinine, Ser: 2.05 mg/dL — ABNORMAL HIGH (ref 0.61–1.24)
GFR, Estimated: 34 mL/min — ABNORMAL LOW (ref >60)
Glucose, Bld: 1007 mg/dL (ref 70–99)
Phosphorus: 4.8 mg/dL — ABNORMAL HIGH (ref 2.5–4.6)
Potassium: 4.5 mmol/L (ref 3.5–5.1)
Sodium: 132 mmol/L — ABNORMAL LOW (ref 135–145)

## 2024-02-03 LAB — CBC
HCT: 26.9 % — ABNORMAL LOW (ref 39.0–52.0)
Hemoglobin: 8.7 g/dL — ABNORMAL LOW (ref 13.0–17.0)
MCH: 27.4 pg (ref 26.0–34.0)
MCHC: 32.3 g/dL (ref 30.0–36.0)
MCV: 84.9 fL (ref 80.0–100.0)
Platelets: 571 K/uL — ABNORMAL HIGH (ref 150–400)
RBC: 3.17 MIL/uL — ABNORMAL LOW (ref 4.22–5.81)
RDW: 17.6 % — ABNORMAL HIGH (ref 11.5–15.5)
WBC: 21 K/uL — ABNORMAL HIGH (ref 4.0–10.5)
nRBC: 0 % (ref 0.0–0.2)

## 2024-02-03 LAB — CULTURE, RESPIRATORY W GRAM STAIN

## 2024-02-03 MED ORDER — FUROSEMIDE 10 MG/ML IJ SOLN
80.0000 mg | Freq: Two times a day (BID) | INTRAMUSCULAR | Status: AC
  Administered 2024-02-03 (×2): 80 mg via INTRAVENOUS
  Filled 2024-02-03 (×2): qty 8

## 2024-02-03 MED ORDER — DEXMEDETOMIDINE HCL IN NACL 400 MCG/100ML IV SOLN
0.0000 ug/kg/h | INTRAVENOUS | Status: AC
  Administered 2024-02-03: 0.2 ug/kg/h via INTRAVENOUS

## 2024-02-03 MED ORDER — ATORVASTATIN CALCIUM 80 MG PO TABS
80.0000 mg | ORAL_TABLET | Freq: Every day | ORAL | Status: DC
  Administered 2024-02-03: 80 mg
  Filled 2024-02-03: qty 1

## 2024-02-03 MED ORDER — HYDROMORPHONE HCL 1 MG/ML IJ SOLN
0.5000 mg | Freq: Four times a day (QID) | INTRAMUSCULAR | Status: DC | PRN
  Administered 2024-02-03 – 2024-02-04 (×3): 0.5 mg via INTRAVENOUS
  Filled 2024-02-03 (×3): qty 0.5

## 2024-02-03 MED ORDER — HYDRALAZINE HCL 25 MG PO TABS
25.0000 mg | ORAL_TABLET | Freq: Three times a day (TID) | ORAL | Status: DC
  Administered 2024-02-03 – 2024-02-04 (×4): 25 mg via ORAL
  Filled 2024-02-03 (×4): qty 1

## 2024-02-03 MED ORDER — TRACE MINERALS CU-MN-SE-ZN 300-55-60-3000 MCG/ML IV SOLN
INTRAVENOUS | Status: AC
  Filled 2024-02-03: qty 761.6

## 2024-02-03 MED ORDER — FUROSEMIDE 10 MG/ML IJ SOLN: 80.0000 mg | Freq: Two times a day (BID) | INTRAMUSCULAR | Status: DC

## 2024-02-03 MED ORDER — ALPRAZOLAM 0.25 MG PO TABS
0.2500 mg | ORAL_TABLET | Freq: Three times a day (TID) | ORAL | Status: DC | PRN
  Administered 2024-02-03 – 2024-02-04 (×3): 0.25 mg via ORAL
  Filled 2024-02-03 (×3): qty 1

## 2024-02-03 MED ORDER — AMIODARONE HCL 200 MG PO TABS
400.0000 mg | ORAL_TABLET | Freq: Two times a day (BID) | ORAL | Status: DC
  Administered 2024-02-03 – 2024-02-04 (×3): 400 mg via ORAL
  Filled 2024-02-03 (×3): qty 2

## 2024-02-03 NOTE — Progress Notes (Signed)
 Patient ID: Billy [name], male   DOB: Jan 03, 1951, 73 y.o.   MRN: [id number]     Advanced Heart Failure Rounding Note  Cardiologist: Dorn Lesches, MD   Chief Complaint: S/P esophageal stent  Subjective:    - S/p EGD with stent repositioning by Dr. Shyrl 10/15.  - Limited echo 10/17 with EF 50-55%, RV mildly reduced  SBP running higher, up to 150s now. Patient reports dyspnea, diffuse pain.   Co-ox 70%, CVP 14 this morning. I/Os positive.    SCr 2.6>>2.4>>pending BMET today.    He remains in atrial fibrillation with controlled rate on amiodarone  gtt and heparin  gtt.   Objective:    Weight Range: 84 kg Body mass index is 24.43 kg/m.   Vital Signs:   Temp:  [97.8 F (36.6 C)-98.9 F (37.2 C)] 98.1 F (36.7 C) (10/18 0836) Pulse Rate:  [62-97] 80 (10/18 0745) Resp:  [17-34] 20 (10/18 0745) BP: (85-180)/(42-81) 113/45 (10/18 0745) SpO2:  [86 %-100 %] 93 % (10/18 0745) Weight:  [84 kg] 84 kg (10/18 0500) Last BM Date : 02/02/24  Weight change: Filed Weights   02/01/24 0412 02/02/24 0600 02/03/24 0500  Weight: 83.5 kg 83.8 kg 84 kg   Intake/Output:  Intake/Output Summary (Last 24 hours) at 02/03/2024 0847 Last data filed at 02/03/2024 0700 Gross per 24 hour  Intake 3081.66 ml  Output 1380 ml  Net 1701.66 ml    Physical Exam   CVP 14  General: NAD Neck: JVP 12-14 cm, no thyromegaly or thyroid  nodule.  Lungs: Decreased at bases.  CV: Nondisplaced PMI.  Heart irregular S1/S2, no S3/S4, no murmur.  1+ edema to knees.  Abdomen: Soft, nontender, no hepatosplenomegaly, no distention.  Skin: Intact without lesions or rashes.  Neurologic: Alert and oriented x 3.  Psych: Normal affect. Extremities: No clubbing or cyanosis.  HEENT: Normal.   Telemetry   Afib 80s, personally reviewed   Labs    CBC Recent Labs    02/02/24 0229 02/03/24 0529  WBC 18.8* 21.0*  HGB 9.5* 8.7*  HCT 29.7* 26.9*  MCV 85.8 84.9  PLT 540* 571*   Basic Metabolic Panel Recent  Labs    02/01/24 0540 02/01/24 0936 02/02/24 0851  NA 127* 134* 135  K 4.3 4.2 4.0  CL 97* 100 100  CO2 19* 20* 22  GLUCOSE 316* 160* 148*  BUN 95* 97* 107*  CREATININE 2.26* 2.57* 2.42*  CALCIUM  7.6* 8.3* 8.4*  MG 2.7* 2.5*  --   PHOS 3.9 4.1  --    Liver Function Tests Recent Labs    02/01/24 0936 02/02/24 0851  AST 38 30  ALT 45* 35  ALKPHOS 183* 153*  BILITOT 0.7 0.4  PROT 6.1* 5.6*  ALBUMIN  1.5* <1.5*   BNP (last 3 results) Recent Labs    03/09/23 1544 03/11/23 0218 06/27/23 1116  BNP 660.9* 491.3* 646.4*   Hemoglobin A1C No results for input(s): HGBA1C in the last 72 hours.  Fasting Lipid Panel Recent Labs    02/02/24 0229  TRIG 42    Medications:    Scheduled Medications:  bisacodyl   10 mg Rectal Q0600   Chlorhexidine  Gluconate Cloth  6 each Topical Daily   furosemide   80 mg Intravenous BID   hydrALAZINE   25 mg Oral TID   lidocaine   1 patch Transdermal Q24H   oxyCODONE   5 mg Oral Q6H   pantoprazole  (PROTONIX ) IV  40 mg Intravenous QHS   sodium chloride  flush  10 mL Intrapleural Q8H  sodium chloride  flush  10-40 mL Intracatheter Q12H    Infusions:  amiodarone  30 mg/hr (02/03/24 0700)   dexmedetomidine  (PRECEDEX ) IV infusion 0.2 mcg/kg/hr (02/03/24 0700)   fluconazole (DIFLUCAN) IV Stopped (02/02/24 1121)   heparin  2,000 Units/hr (02/03/24 0700)   ketamine (KETALAR) adult infusion 0.5 mg/kg/hr (02/03/24 0700)   norepinephrine  (LEVOPHED ) Adult infusion Stopped (02/03/24 0617)   piperacillin -tazobactam (ZOSYN )  IV 12.5 mL/hr at 02/03/24 0700   TPN ADULT (ION) 70 mL/hr at 02/03/24 0700    PRN Medications: acetaminophen , hydrALAZINE , HYDROmorphone  (DILAUDID ) injection, levalbuterol, ondansetron  (ZOFRAN ) IV, mouth rinse, oxyCODONE , sodium chloride  flush  Patient Profile    Patient with longstanding history of chronic heart failure with preserved ejection fraction, RV failure with history of pulmonary embolism presents with esophageal  perforation.  S/P Stent Esophageal Stent. Stent repositioned 10/15.  Assessment/Plan  1.  Esophageal Perforation - Boerhaave syndrome.  - CT C/A/P: with pneumomediastinum and large mass-like density in lower esophagus - Prior EGD 3/25: erosive gastritis, duodenal deformity, and bleeding friable duodenal mucosa s/p hemostatic - 01/23/24 S/P EGD with esophageal stent placed  - Stent migrated w/ leak>>back to OR 10/15 for repositioning  - On TPN - Getting sips with meds.  - Continues on Zosyn  + fluconazole.    2. Acute on chronic HFpEF: h/o cardiogenic shock with RV failure in the setting of cardiac arrest thought to be caused by acute PE vs ACS in 3/25. Echo at the time showed EF 55%, severe, LVH, G2DD, and severely reduced RV function with strain. There was concern for cardiac sarcoid amyloid, however CMR LGE consistent with ischemic disease. RV on echo 9/25 with complete recovery, EF 60-65%, nl RV function.    - RHC 3/25: Mild PAH with severe RV failure though CO is preserved.   - Repeat echo 10/17 with EF 50-55%, RV mildly reduced  - CVP 14 today.  Creatinine has been elevated but stable, still pending BMET today.  Will give Lasix  80 mg IV bid x 2 doses today.  - Holding Entresto  and spiro with AKI.  - GDMT limited by renal function   3. 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 Medical management.  - Restart atorvastatin  80 mg daily.    4.  AKI on CKD Stage 3b: Baseline sCr ~ 2.1. - SCr trending back down 2.6>>2.4>>pending BMET today (send now)     5. PAF:   - s/p TEE/DCCV 3/25 - developed AF with RVR, unsure when as tele is not available. Currently AF w/ CVR  - On IV amiodarone  given illeus/NPO status and poor absorption - He is on heparin  gtt, hgb remaining stable.  - Not candidate for TEE at this point with esophageal perforation and stent placement.  Would aim for DCCV after 4 wks therapeutic anticoagulation if  he remains in AF.  6.  H/o DVT/PE:  Bilateral upper and lower DVTs, mixed acute and chronic.  - s/p infrarenal IVC placement 3/25 - V/Q 3/25 with multiple perfusion defects - will need lifelong AC, now on heparin  gtt.    7. Carotid stenosis: h/o CVA. TCAR in 6/24. Okay to remain off plavix  at this point. - carotid US  9/25: widely patent carotid stent on R and no stenosis on L   8. ID:  PCT was elevated 10/14 at 1.32. being treated for possible PNA. Now on Zosyn . Also w/ ileus. WBC trending down  - continue abx per CCM   Length  of Stay: 56  Ezra Shuck, MD  02/03/2024, 8:47 AM  Advanced Heart Failure Team Pager [phone] (M-F; 7a - 5p)  Please contact [facility] for night-coverage after hours (5p -7a ) and weekends on [email or web address]

## 2024-02-03 NOTE — Progress Notes (Signed)
 PHARMACY - ANTICOAGULATION CONSULT NOTE  Pharmacy Consult for heparin  infusion Indication: atrial fibrillation  Allergies  Allergen Reactions   Norvasc  [Amlodipine ] Swelling and Other (See Comments)    Excessive BLE swelling Skin discoloration, blotching   Jardiance  [Empagliflozin ] Itching and Other (See Comments)    Patient mentioned penile swelling, pain   Morphine And Codeine Itching    Opioid-induced pruritus    Patient Measurements: Height: 6' 1 (185.4 cm) Weight: 84 kg (185 lb 3 oz) IBW/kg (Calculated) : 79.9 HEPARIN  DW (KG): 80.6  Vital Signs: Temp: 98.3 F (36.8 C) (10/18 1146) Temp Source: Oral (10/18 1146) BP: 155/57 (10/18 1830) Pulse Rate: 102 (10/18 1830)  Labs: Recent Labs    02/01/24 0430 02/01/24 0540 02/01/24 0936 02/01/24 1423 02/02/24 0229 02/02/24 0851 02/02/24 1130 02/03/24 0529 02/03/24 0856 02/03/24 1400 02/03/24 1705  HGB 8.3*  --   --   --  9.5*  --   --  8.7*  --   --   --   HCT 24.0*  --   --   --  29.7*  --   --  26.9*  --   --   --   PLT 490*  --   --   --  540*  --   --  571*  --   --   --   HEPARINUNFRC <0.10*  --   --    < > <0.10*  --    < > 0.29*  --  0.18* 0.14*  CREATININE  --    < > 2.57*  --   --  2.42*  --   --  2.05*  --   --    < > = values in this interval not displayed.    Estimated Creatinine Clearance: 37.4 mL/min (A) (by C-G formula based on SCr of 2.05 mg/dL (H)).   Assessment: 73 yo M presenting for esophageal rupture, now s/p esophageal stent placement on 10/6. PMH significant for HFrEF, Afib on Eliquis  PTA. Last dose of Eliquis  01/20/24, time unknown. Pharmacy consulted for heparin  management.  10/15 - underwent EGD with stent reposition on 10/15 after finding esophageal leak  Heparin  level 0.18 < goal after heparin  drip increased earlier today drip rate 2000 uts/hr (23uts/kg)  rechecked and still lower 0.14     No issues with infusion or bleeding per RN. Heparin  running in L-PIV that seems to be working  fine and labs from PICC Will move heparin  infusion to PICC line to ensure running into patient and recheck labs from a peripheral stick - to better assess heparin  dosing     Goal of Therapy:  Heparin  level 0.3-0.5 units/ml Monitor platelets by anticoagulation protocol: Yes   Plan:  heparin  infusion 2000 units/hr Heparin  level and CBC daily     Olam Chalk Pharm.D. CPP, BCPS Clinical Pharmacist [phone] 02/03/2024 6:55 PM

## 2024-02-03 NOTE — Progress Notes (Signed)
 PHARMACY - ANTICOAGULATION CONSULT NOTE  Pharmacy Consult for heparin  infusion Indication: atrial fibrillation  Allergies  Allergen Reactions   Norvasc  [Amlodipine ] Swelling and Other (See Comments)    Excessive BLE swelling Skin discoloration, blotching   Jardiance  [Empagliflozin ] Itching and Other (See Comments)    Patient mentioned penile swelling, pain   Morphine And Codeine Itching    Opioid-induced pruritus    Patient Measurements: Height: 6' 1 (185.4 cm) Weight: 83.8 kg (184 lb 11.9 oz) IBW/kg (Calculated) : 79.9 HEPARIN  DW (KG): 80.6  Vital Signs: Temp: 98.4 F (36.9 C) (10/17 2317) Temp Source: Axillary (10/17 2317) BP: 120/56 (10/18 0400) Pulse Rate: 71 (10/18 0400)  Labs: Recent Labs    02/01/24 0430 02/01/24 0540 02/01/24 0936 02/01/24 1423 02/02/24 0229 02/02/24 0851 02/02/24 1130 02/02/24 2146 02/03/24 0529  HGB 8.3*  --   --   --  9.5*  --   --   --  8.7*  HCT 24.0*  --   --   --  29.7*  --   --   --  26.9*  PLT 490*  --   --   --  540*  --   --   --  571*  HEPARINUNFRC <0.10*  --   --    < > <0.10*  --  <0.10* 0.13* 0.29*  CREATININE  --  2.26* 2.57*  --   --  2.42*  --   --   --    < > = values in this interval not displayed.    Estimated Creatinine Clearance: 31.6 mL/min (A) (by C-G formula based on SCr of 2.42 mg/dL (H)).   Assessment: 73 yo M presenting for esophageal rupture, now s/p esophageal stent placement on 10/6. PMH significant for HFrEF, Afib on Eliquis  PTA. Last dose of Eliquis  01/20/24, time unknown. Pharmacy consulted for heparin  management.  10/15 - underwent EGD with stent reposition on 10/15 after finding esophageal leak  Heparin  level 0.13 is subtherapeutic on 1700 units/hr.  No issues with infusion or bleeding per RN.  10/18 AM update:  Heparin  level just below goal  Goal of Therapy:  Heparin  level 0.3-0.5 units/ml Monitor platelets by anticoagulation protocol: Yes   Plan:  Increase heparin  infusion to 2000  units/hr Heparin  level in 8 hours  Lynwood Mckusick, PharmD, BCPS Clinical Pharmacist Phone: [phone]

## 2024-02-03 NOTE — Progress Notes (Signed)
 PHARMACY - TOTAL PARENTERAL NUTRITION CONSULT NOTE   Indication: intolerance to enteral feedings, esophageal perforation  Patient Measurements: Height: 6' 1 (185.4 cm) Weight: 84 kg (185 lb 3 oz) IBW/kg (Calculated) : 79.9 TPN AdjBW (KG): 80.6 Body mass index is 24.43 kg/m.  Assessment:  73 year old man with medical history significant for cardiac arrest earlier this year who was admitted after several days of nausea and vomiting followed by sudden onset severe tearing epigastric pain radiating to back and chest. 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 Pharmacy consulted to initiate TPN due to inability to tolerate enteral nutrition at this time.  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 PTA takes torsemide  20 mg PO daily, now held. Aggressive fluid resuscitation recommended per GI. Per HF, patient currently euvolemic but will hold diuretic for now. Due to CHF will target lower end of fluid range for TPN and supplement additional fluids per MD outside of TPN to prevent fluid overload.   Stent placed on 10/6, esophogram not showing any leak. Diet advanced to CLD. 10/15 Leak present and made NPO.  Glucose / Insulin : A1C 6.0% (02/2023) ;  BGs 125-168, 8 units insulin  in TPN, no SSI.  Electrolytes: Na 132, K 4.5, CO2 22, CoCa up 7.9 [CoCa 9.98], Mg 2.5 (none in TPN), Phos 4.8 Renal: Scr 2.42 down (bsl ~2), BUN 107 up  Hepatic: AST/ALT trend down 30 / 35, Alk Phos 153, tbili WNL, alb <1.5, TG 42. Intake / Output; MIVF:  furosemide  IV 40 mg x1 on 10/17 [lasix  80 mg iv x2 on 10/18 ordered]; UOP 0.67 mL/kg/hr, chest tube 30 ml, LBM 10/17 [+2 unmeasured] Net IO Since Admission: 13,506.07 mL [02/03/24 0856]  GI Imaging: 10/15 Esoph XR: esophageal perforation/leak at the distal esophagus, proximal to the esophageal stent 10/14 KUB: Gas-filled loops of small bowel and colon suggestive of ileus 10/7 Esoph XR: No leak associated with stent coverage  of esophageal perforation  10/06 Esoph XR: distal esophageal perforation 10/05 CT: concerning for esophageal perforation, masslike area of soft tissue thickening adjacent to and surrounding the esophagus  GI Surgeries / Procedures:  10/6: esophageal stent placement  10/15 EGD and stent repositioning   Central access: 01/22/24 TPN start date: 01/22/24  Nutritional Goals: Goal TPN rate is 82 mL/hr (provides 110 g of protein and 2099 kcals per day)  Goal concentrated TPN is 70 ml/hr (provides 114 g of protein and 2100 kcals per day  RD Assessment:  Estimated Needs Total Energy Estimated Needs: 2300-2500 kcals Total Protein Estimated Needs: 115-130 g Total Fluid Estimated Needs: 2L  Current Nutrition:  TPN 10/13- 10/14 DYS1: not taking any po  10/15: NPO d/t increased abd distension   Plan:  Continue concentrated TPN at goal 70 mL/hr at 1800 to meet 100% total nutritional needs (provides 114 g of protein and 2100 kcals per day).   Electrolytes in TPN: continue Na 95 mEq/L, K 8 mEq/L, Ca 0 mEq/L, Mg 0 mEq/L, Phos 0 mmol/L. Cl:Ac 1:1 Add standard MVI and trace elements to TPN SSI stopped and decrease BG checks to q8hr  8 units insulin  regular to TPN  Monitor TPN labs on Mon/Thurs, and PRN F/u ability to take PO, wean TPN as able   Thank you for involving pharmacy in this patient's care.  Benedetta Heath BS, PharmD, BCPS Clinical Pharmacist 02/03/2024 6:56 AM  Contact: [phone] after 3 PM

## 2024-02-03 NOTE — Progress Notes (Addendum)
 NAME:  Billy [name], MRN:  [id number], DOB:  [birth year], LOS: 13 ADMISSION DATE:  01/21/2024, CONSULTATION DATE:  01/21/24 REFERRING MD:  EDP, CHIEF COMPLAINT:  chest pain   History of Present Illness:  Billy [name] is a 73 year old man known to our service with cardiac arrest earlier this year along with HFrEF, Afib, PE who is presenting with several days of N/V from a stomach bug followed by sudden onset severe tearing epigastric pain radiating to back and chest.  Workup revealed esophageal tear.  Noted prior EGD during cardiac arrest admit showing duodenitis nonspecific requiring hemospray, erosive gastritis but normal esophagus.  An esophageal stent was placed on 10/7 and then a subsequent swallow study revealed an additional leak.  He was taken back to the OR for EGD and stent repositioning on 10/15 and was hypoxic with gastric contents noted on intubation.  Pt was too hypoxic for extubation post-procedure, so the patient was left intubated and PCCM consulted and the patient transferred to intensive care  Pertinent  Medical History  HFrEf Afib on [facility]   Significant Hospital Events: Including procedures, antibiotic start and stop dates in addition to other pertinent events   10/5 admit 10/7 esophageal stent 10/15 EGD and stent repositioning, likely aspiration and left intubated  10/16  successfully extubated. POCUS right effusion. Small bore CT placed. ~300 ml; gm stain neg, exudate by light's criteria. Lots of challenge w/ pain. Getting Dilaudid  and dex gtt.  10/17 marked pain still. Changing med regimen to ketamine w/ scheduled orals and decreased freq of dilaudid    Interim History / Subjective:  Patient remained on ketamine, Precedex  and multiple pain meds, he is drowsy When he wakes up he continued to complain of diffuse pain Oxygen  requirement has increased likely due to volume overload leading to pulmonary edema and aspiration pneumonia He is afebrile  Objective    Blood pressure (!)  141/62, pulse 88, temperature 98.1 F (36.7 C), temperature source Oral, resp. rate 17, height 6' 1 (1.854 m), weight 84 kg, SpO2 92%. CVP:  [6 mmHg-15 mmHg] 14 mmHg  FiO2 (%):  [60 %] 60 %   Intake/Output Summary (Last 24 hours) at 02/03/2024 1024 Last data filed at 02/03/2024 0900 Gross per 24 hour  Intake 3083 ml  Output 1380 ml  Net 1703 ml   Filed Weights   02/01/24 0412 02/02/24 0600 02/03/24 0500  Weight: 83.5 kg 83.8 kg 84 kg    Physical exam: General: Acute on chronically ill-appearing elderly male, lying on the bed HEENT: [place]/AT, eyes anicteric.  moist mucus membranes.  On nasal cannula oxygen  Neuro: Lethargic, opens eyes with vocal stimuli, generalized weak Chest: Bilateral coarse crackles all over Heart: Irregularly irregular, no murmurs or gallops Abdomen: Soft, nontender, nondistended, bowel sounds present  Labs and images reviewed  Patient Lines/Drains/Airways Status     Active Line/Drains/Airways     Name Placement date Placement time Site Days   Peripheral IV 01/31/24 22 G 2.5 Anterior;Left Forearm 01/31/24  2010  Forearm  3   Peripheral IV 02/01/24 22 G 1.75 Anterior;Left;Proximal Forearm 02/01/24  1515  Forearm  2   PICC Double Lumen 01/22/24 Right Brachial 39 cm 0 cm 01/22/24  1656  -- 12   Chest Tube 1 Lateral;Right Pleural 02/01/24  1330  Pleural  2   External Urinary Catheter 01/25/24  1700  --  9          Resolved problem list  Post-operative nausea  Post operative ventilator management  elevated triglycerides (erroneous reading d/t lab draw site and TPN) Assessment and Plan  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 s/p stent repositioning 10/16; complicated by right exudative pleural effusion (sm bore CT placed 10/16) Continue clear liquid diet as tolerated Continue TPN TCTS following Chest tube output was minimal, 30 cc in last 24 hours  Acute hypoxic respiratory failure 2/2 Aspiration PNA and  acute pulmonary edema Patient's oxygen  requirement is up On exam he has bilateral crackles X-ray chest is suggestive of bilateral infiltrate Placed on Lasix  80 mg x 2 Monitor intake and output Continue IV Zosyn  and Diflucan Encourage incentive spirometry and ambulation  Intractable pain Patient continued to complain of diffuse pain all over This morning he was on ketamine at 0.5 mg/kg/h, Precedex  0.04 mics per KG per minute, scheduled oxycodone , as needed Dilaudid  and oxycodone  Decrease ketamine to 0.3 mg/kg/h Titrate off Precedex  in next couple of hours Space out to Dilaudid  2 every 6 hours as needed Continue as needed and scheduled oxycodone  crushed via G-tube Continue lidocaine  patch  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  Critical care was necessary to treat or prevent imminent or life-threatening deterioration.  Critical care was time spent personally by me on the following activities: development of treatment plan with patient and/or surrogate as well as nursing, discussions with consultants, evaluation of patient's response to treatment, examination of patient, obtaining history from patient or surrogate, ordering and performing treatments and interventions, ordering and review of laboratory studies, ordering and review of radiographic studies, pulse oximetry, re-evaluation of patient's condition and participation in multidisciplinary rounds.   During this encounter  critical care time was devoted to patient care services described in this note for 33 minutes.     Valinda Novas, MD [facility] See Amion for pager If no response to pager, please call [phone] until 7pm After 7pm, Please call E-link [phone]

## 2024-02-03 NOTE — Progress Notes (Signed)
 Critical glucose called to this nurse by lab tech Brso. Informed her that blood was drawn from port which recently had glucose containing fluids in it. Will monitor CBG closely.

## 2024-02-03 NOTE — Plan of Care (Signed)

## 2024-02-03 NOTE — Progress Notes (Signed)
 IP rehab admissions - I spoke with patient briefly and then called his son, [name].  [name] cannot provide 24/7 supervision or care after discharge.  Son tells me that patient will need SNF placement until he can discharge home.  He lives alone.  Son says he went to a facility about 6 months ago and had some rehab.  Patient is not medically ready at this time.  He continues on TPN and drips.  When he is medically ready, he will likely need SNF placement.  [phone]

## 2024-02-04 ENCOUNTER — Inpatient Hospital Stay (HOSPITAL_COMMUNITY)

## 2024-02-04 DIAGNOSIS — J9601 Acute respiratory failure with hypoxia: Secondary | ICD-10-CM

## 2024-02-04 DIAGNOSIS — J90 Pleural effusion, not elsewhere classified: Secondary | ICD-10-CM | POA: Diagnosis not present

## 2024-02-04 DIAGNOSIS — I509 Heart failure, unspecified: Secondary | ICD-10-CM | POA: Diagnosis not present

## 2024-02-04 DIAGNOSIS — K223 Perforation of esophagus: Secondary | ICD-10-CM | POA: Diagnosis not present

## 2024-02-04 DIAGNOSIS — N1832 Chronic kidney disease, stage 3b: Secondary | ICD-10-CM | POA: Diagnosis not present

## 2024-02-04 DIAGNOSIS — R918 Other nonspecific abnormal finding of lung field: Secondary | ICD-10-CM | POA: Diagnosis not present

## 2024-02-04 DIAGNOSIS — I5032 Chronic diastolic (congestive) heart failure: Secondary | ICD-10-CM | POA: Diagnosis not present

## 2024-02-04 DIAGNOSIS — I480 Paroxysmal atrial fibrillation: Secondary | ICD-10-CM | POA: Diagnosis not present

## 2024-02-04 DIAGNOSIS — N179 Acute kidney failure, unspecified: Secondary | ICD-10-CM | POA: Diagnosis not present

## 2024-02-04 DIAGNOSIS — R0602 Shortness of breath: Secondary | ICD-10-CM | POA: Diagnosis not present

## 2024-02-04 DIAGNOSIS — I4891 Unspecified atrial fibrillation: Secondary | ICD-10-CM | POA: Diagnosis not present

## 2024-02-04 LAB — BASIC METABOLIC PANEL WITH GFR
Anion gap: 14 (ref 5–15)
BUN: 125 mg/dL — ABNORMAL HIGH (ref 8–23)
CO2: 23 mmol/L (ref 22–32)
Calcium: 8 mg/dL — ABNORMAL LOW (ref 8.9–10.3)
Chloride: 103 mmol/L (ref 98–111)
Creatinine, Ser: 2.66 mg/dL — ABNORMAL HIGH (ref 0.61–1.24)
GFR, Estimated: 25 mL/min — ABNORMAL LOW (ref >60)
Glucose, Bld: 154 mg/dL — ABNORMAL HIGH (ref 70–99)
Potassium: 3.8 mmol/L (ref 3.5–5.1)
Sodium: 140 mmol/L (ref 135–145)

## 2024-02-04 LAB — COOXEMETRY PANEL
Carboxyhemoglobin: 0.3 % — ABNORMAL LOW (ref 0.5–1.5)
Methemoglobin: <0.7 % (ref 0.0–1.5)
O2 Saturation: 70.8 %
Total hemoglobin: 8.5 g/dL — ABNORMAL LOW (ref 12.0–16.0)

## 2024-02-04 LAB — POCT I-STAT 7, (LYTES, BLD GAS, ICA,H+H)
Acid-base deficit: 1 mmol/L (ref 0.0–2.0)
Bicarbonate: 23.7 mmol/L (ref 20.0–28.0)
Calcium, Ion: 1.18 mmol/L (ref 1.15–1.40)
HCT: 24 % — ABNORMAL LOW (ref 39.0–52.0)
Hemoglobin: 8.2 g/dL — ABNORMAL LOW (ref 13.0–17.0)
O2 Saturation: 94 %
Patient temperature: 98.6
Potassium: 3.8 mmol/L (ref 3.5–5.1)
Sodium: 143 mmol/L (ref 135–145)
TCO2: 25 mmol/L (ref 22–32)
pCO2 arterial: 40.2 mmHg (ref 32–48)
pH, Arterial: 7.377 (ref 7.35–7.45)
pO2, Arterial: 72 mmHg — ABNORMAL LOW (ref 83–108)

## 2024-02-04 LAB — RENAL FUNCTION PANEL
Albumin: <1.5 g/dL — ABNORMAL LOW (ref 3.5–5.0)
Anion gap: 14 (ref 5–15)
BUN: 126 mg/dL — ABNORMAL HIGH (ref 8–23)
CO2: 23 mmol/L (ref 22–32)
Calcium: 8 mg/dL — ABNORMAL LOW (ref 8.9–10.3)
Chloride: 102 mmol/L (ref 98–111)
Creatinine, Ser: 2.67 mg/dL — ABNORMAL HIGH (ref 0.61–1.24)
GFR, Estimated: 25 mL/min — ABNORMAL LOW (ref >60)
Glucose, Bld: 155 mg/dL — ABNORMAL HIGH (ref 70–99)
Phosphorus: 4.8 mg/dL — ABNORMAL HIGH (ref 2.5–4.6)
Potassium: 3.8 mmol/L (ref 3.5–5.1)
Sodium: 139 mmol/L (ref 135–145)

## 2024-02-04 LAB — CBC
HCT: 25 % — ABNORMAL LOW (ref 39.0–52.0)
Hemoglobin: 8.1 g/dL — ABNORMAL LOW (ref 13.0–17.0)
MCH: 27.6 pg (ref 26.0–34.0)
MCHC: 32.4 g/dL (ref 30.0–36.0)
MCV: 85 fL (ref 80.0–100.0)
Platelets: 520 K/uL — ABNORMAL HIGH (ref 150–400)
RBC: 2.94 MIL/uL — ABNORMAL LOW (ref 4.22–5.81)
RDW: 17.6 % — ABNORMAL HIGH (ref 11.5–15.5)
WBC: 17.2 K/uL — ABNORMAL HIGH (ref 4.0–10.5)
nRBC: 0 % (ref 0.0–0.2)

## 2024-02-04 LAB — GLUCOSE, CAPILLARY
Glucose-Capillary: 145 mg/dL — ABNORMAL HIGH (ref 70–99)
Glucose-Capillary: 165 mg/dL — ABNORMAL HIGH (ref 70–99)
Glucose-Capillary: 175 mg/dL — ABNORMAL HIGH (ref 70–99)
Glucose-Capillary: 159 mg/dL — ABNORMAL HIGH (ref 70–99)
Glucose-Capillary: 149 mg/dL — ABNORMAL HIGH (ref 70–99)
Glucose-Capillary: 151 mg/dL — ABNORMAL HIGH (ref 70–99)

## 2024-02-04 LAB — MAGNESIUM: Magnesium: 2.4 mg/dL (ref 1.7–2.4)

## 2024-02-04 LAB — HEPARIN LEVEL (UNFRACTIONATED): Heparin Unfractionated: 0.24 [IU]/mL — ABNORMAL LOW (ref 0.30–0.70)

## 2024-02-04 MED ORDER — IPRATROPIUM-ALBUTEROL 0.5-2.5 (3) MG/3ML IN SOLN
RESPIRATORY_TRACT | Status: AC
  Administered 2024-02-04: 3 mL via RESPIRATORY_TRACT
  Filled 2024-02-04: qty 3

## 2024-02-04 MED ORDER — LINEZOLID 600 MG/300ML IV SOLN: 600.0000 mg | Freq: Two times a day (BID) | INTRAVENOUS | Status: DC

## 2024-02-04 MED ORDER — LORAZEPAM 2 MG/ML IJ SOLN
1.0000 mg | Freq: Four times a day (QID) | INTRAMUSCULAR | Status: DC | PRN
  Administered 2024-02-04 – 2024-02-18 (×27): 1 mg via INTRAVENOUS
  Filled 2024-02-04 (×28): qty 1

## 2024-02-04 MED ORDER — QUETIAPINE FUMARATE 25 MG PO TABS
25.0000 mg | ORAL_TABLET | Freq: Two times a day (BID) | ORAL | Status: DC
  Administered 2024-02-04: 25 mg via ORAL
  Filled 2024-02-04: qty 1

## 2024-02-04 MED ORDER — IPRATROPIUM-ALBUTEROL 0.5-2.5 (3) MG/3ML IN SOLN: 3.0000 mL | Freq: Once | RESPIRATORY_TRACT | Status: AC

## 2024-02-04 MED ORDER — VANCOMYCIN VARIABLE DOSE PER UNSTABLE RENAL FUNCTION (PHARMACIST DOSING): Status: DC

## 2024-02-04 MED ORDER — HEPARIN (PORCINE) 25000 UT/250ML-% IV SOLN: 2150.0000 [IU]/h | INTRAVENOUS | Status: DC

## 2024-02-04 MED ORDER — AMIODARONE HCL IN DEXTROSE 360-4.14 MG/200ML-% IV SOLN
30.0000 mg/h | INTRAVENOUS | Status: DC
Start: 2024-02-04 — End: 2024-02-20
  Administered 2024-02-04 – 2024-02-20 (×32): 30 mg/h via INTRAVENOUS
  Filled 2024-02-04 (×34): qty 200

## 2024-02-04 MED ORDER — SODIUM CHLORIDE 0.9 % IV SOLN
1.0000 g | Freq: Three times a day (TID) | INTRAVENOUS | Status: DC
  Administered 2024-02-04 – 2024-02-06 (×6): 1 g via INTRAVENOUS
  Filled 2024-02-04 (×6): qty 20

## 2024-02-04 MED ORDER — HYDROMORPHONE HCL 1 MG/ML IJ SOLN
0.5000 mg | INTRAMUSCULAR | Status: DC | PRN
Start: 2024-02-04 — End: 2024-02-10
  Administered 2024-02-04 – 2024-02-10 (×12): 0.5 mg via INTRAVENOUS
  Filled 2024-02-04 (×12): qty 0.5

## 2024-02-04 MED ORDER — POTASSIUM CHLORIDE 10 MEQ/50ML IV SOLN
10.0000 meq | INTRAVENOUS | Status: AC
  Administered 2024-02-04 (×2): 10 meq via INTRAVENOUS
  Filled 2024-02-04 (×2): qty 50

## 2024-02-04 MED ORDER — VANCOMYCIN HCL 2000 MG/400ML IV SOLN
2000.0000 mg | Freq: Once | INTRAVENOUS | Status: AC
  Administered 2024-02-04: 2000 mg via INTRAVENOUS
  Filled 2024-02-04: qty 400

## 2024-02-04 MED ORDER — TRACE MINERALS CU-MN-SE-ZN 300-55-60-3000 MCG/ML IV SOLN
INTRAVENOUS | Status: AC
  Filled 2024-02-04: qty 761.6

## 2024-02-04 MED ORDER — ALBUTEROL SULFATE (2.5 MG/3ML) 0.083% IN NEBU
INHALATION_SOLUTION | RESPIRATORY_TRACT | Status: AC
Start: 2024-02-04 — End: 2024-02-04
  Administered 2024-02-04: 2.5 mg
  Filled 2024-02-04: qty 3

## 2024-02-04 MED ORDER — ATORVASTATIN CALCIUM 80 MG PO TABS
80.0000 mg | ORAL_TABLET | Freq: Every day | ORAL | Status: DC
  Administered 2024-02-04: 80 mg via ORAL
  Filled 2024-02-04: qty 1

## 2024-02-04 MED ORDER — FUROSEMIDE 10 MG/ML IJ SOLN
40.0000 mg | Freq: Once | INTRAMUSCULAR | Status: AC
Start: 2024-02-05 — End: 2024-02-04
  Administered 2024-02-04: 40 mg via INTRAVENOUS
  Filled 2024-02-04: qty 4

## 2024-02-04 MED ORDER — VANCOMYCIN HCL 1750 MG/350ML IV SOLN: 1750.0000 mg | INTRAVENOUS | Status: DC

## 2024-02-04 MED ORDER — APIXABAN 5 MG PO TABS
5.0000 mg | ORAL_TABLET | Freq: Two times a day (BID) | ORAL | Status: DC
  Administered 2024-02-04: 5 mg via ORAL
  Filled 2024-02-04: qty 1

## 2024-02-04 MED ORDER — ORAL CARE MOUTH RINSE: 15.0000 mL | OROMUCOSAL | Status: DC | PRN

## 2024-02-04 MED ORDER — CLONAZEPAM 0.5 MG PO TBDP
0.5000 mg | ORAL_TABLET | Freq: Two times a day (BID) | ORAL | Status: DC
  Administered 2024-02-04 (×2): 0.5 mg via ORAL
  Filled 2024-02-04 (×2): qty 1

## 2024-02-04 MED ORDER — HEPARIN (PORCINE) 25000 UT/250ML-% IV SOLN
2250.0000 [IU]/h | INTRAVENOUS | Status: DC
  Administered 2024-02-04: 2150 [IU]/h via INTRAVENOUS
  Administered 2024-02-05: 2250 [IU]/h via INTRAVENOUS
  Administered 2024-02-05: 2150 [IU]/h via INTRAVENOUS
  Filled 2024-02-04 (×2): qty 250

## 2024-02-04 NOTE — Progress Notes (Signed)
 eLink Physician-Brief Progress Note Patient Name: Rashaad Hallstrom DOB: 03/06/1951 MRN: [id number]   Date of Service  02/04/2024  HPI/Events of Note  Camera: For sob.  Discussed with RN. Wheezing, tachypneic. Awake, able to protect airways, getting TPN. Making > 300 urine since shift. Lasix  on hold from AM, due to rise in Creatinine. HR  at 94, sats 95% on NRB ( just placed on it). SBP 153.   S/p esophageal stent, cachexia, CHF. AKI ( cr > 2 stable ).  EF 55%. Diastolic dysfunction.   eICU Interventions  Already received nebs Stat Lasix  40 mg once.  Get CxR, ABG , BMP stat  Full code. Low thresh hold for intubation.        Intervention Category Intermediate Interventions: Respiratory distress - evaluation and management  Jodelle ONEIDA Hutching 02/04/2024, 11:32 PM  00:14 ABG: 7.37/40/77  CxR image seen: compared- to old; worsening effusion, CHF.  Discussed with RN, Will watch for lasix  response. If not better, will reach out to ground CCM team for intubation.   02:15 CxR film reviewed, ET in place, right sided > left peri hilar air space density, pneumonia, chf.

## 2024-02-04 NOTE — Progress Notes (Signed)
 PHARMACY - TOTAL PARENTERAL NUTRITION CONSULT NOTE   Indication: intolerance to enteral feedings, esophageal perforation  Patient Measurements: Height: 6' 1 (185.4 cm) Weight: 87.6 kg (193 lb 2 oz) IBW/kg (Calculated) : 79.9 TPN AdjBW (KG): 80.6 Body mass index is 25.48 kg/m.  Assessment:  73 year old man with medical history significant for cardiac arrest earlier this year who was admitted after several days of nausea and vomiting followed by sudden onset severe tearing epigastric pain radiating to back and chest. 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 Pharmacy consulted to initiate TPN due to inability to tolerate enteral nutrition at this time.  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 PTA takes torsemide  20 mg PO daily, now held. Aggressive fluid resuscitation recommended per GI. Per HF, patient currently euvolemic but will hold diuretic for now. Due to CHF will target lower end of fluid range for TPN and supplement additional fluids per MD outside of TPN to prevent fluid overload.   Stent placed on 10/6, esophogram not showing any leak. Diet advanced to CLD. 10/15 Leak present and made NPO.  Glucose / Insulin : A1C 6.0% (02/2023) ;  BGs 139-171, 8 units insulin  in TPN, no SSI.  Electrolytes: Na 140, Cl 103, K 3.8, CO2 23, CoCa up 8.0 [CoCa 10.08], Mg 2.4 (none in TPN), Phos 4.8 Renal: Scr 2.66 down (bsl ~2), BUN 125 Hepatic: AST/ALT trend down 30 / 35, Alk Phos 153, tbili WNL, alb <1.5, TG 42. Intake / Output; MIVF: UOP 1 mL/kg/hr, chest tube 20 ml, LBM 10/18 Net IO Since Admission: 13,873.39 mL [02/04/24 1016]  GI Imaging: 10/15 Esoph XR: esophageal perforation/leak at the distal esophagus, proximal to the esophageal stent 10/14 KUB: Gas-filled loops of small bowel and colon suggestive of ileus 10/7 Esoph XR: No leak associated with stent coverage of esophageal perforation  10/06 Esoph XR: distal esophageal perforation 10/05 CT:  concerning for esophageal perforation, masslike area of soft tissue thickening adjacent to and surrounding the esophagus  GI Surgeries / Procedures:  10/6: esophageal stent placement  10/15 EGD and stent repositioning   Central access: 01/22/24 TPN start date: 01/22/24  Nutritional Goals: Goal TPN rate is 82 mL/hr (provides 110 g of protein and 2099 kcals per day)  Goal concentrated TPN is 70 ml/hr (provides 114 g of protein and 2100 kcals per day  RD Assessment:  Estimated Needs Total Energy Estimated Needs: 2300-2500 kcals Total Protein Estimated Needs: 115-130 g Total Fluid Estimated Needs: 2L  Current Nutrition:  TPN 10/13- 10/14 DYS1: not taking any po  10/15: NPO d/t increased abd distension   Plan:  Continue concentrated TPN at goal 70 mL/hr at 1800 to meet 100% total nutritional needs (provides 114 g of protein and 2100 kcals per day).   Electrolytes in TPN: continue Na 95 mEq/L, K 9 mEq/L, Ca 0 mEq/L, Mg 0 mEq/L, Phos 0 mmol/L. Cl:Ac 1:1 Add standard MVI and trace elements to TPN SSI stopped and decrease BG checks to q8hr  8 units insulin  regular to TPN  K runs iv x2 Monitor TPN labs on Mon/Thurs, and PRN F/u ability to take PO, wean TPN as able   Thank you for involving pharmacy in this patient's care.  Benedetta Heath BS, PharmD, BCPS Clinical Pharmacist 02/04/2024 7:01 AM  Contact: [phone] after 3 PM

## 2024-02-04 NOTE — Plan of Care (Signed)
  Problem: Education: Goal: Knowledge of General Education information will improve Description: Including pain rating scale, medication(s)/side effects and non-pharmacologic comfort measures Outcome: Progressing   Problem: Nutrition: Goal: Adequate nutrition will be maintained Outcome: Progressing   Problem: Coping: Goal: Level of anxiety will decrease Outcome: Progressing   Problem: Elimination: Goal: Will not experience complications related to bowel motility Outcome: Progressing Goal: Will not experience complications related to urinary retention Outcome: Progressing   Problem: Pain Managment: Goal: General experience of comfort will improve and/or be controlled Outcome: Progressing   Problem: Safety: Goal: Ability to remain free from injury will improve Outcome: Progressing   Problem: Skin Integrity: Goal: Risk for impaired skin integrity will decrease Outcome: Progressing   Problem: Coping: Goal: Ability to adjust to condition or change in health will improve Outcome: Progressing

## 2024-02-04 NOTE — Progress Notes (Signed)
 PHARMACY - ANTICOAGULATION CONSULT NOTE  Pharmacy Consult for heparin  infusion Indication: atrial fibrillation  Allergies  Allergen Reactions   Norvasc  [Amlodipine ] Swelling and Other (See Comments)    Excessive BLE swelling Skin discoloration, blotching   Jardiance  [Empagliflozin ] Itching and Other (See Comments)    Patient mentioned penile swelling, pain   Morphine And Codeine Itching    Opioid-induced pruritus    Patient Measurements: Height: 6' 1 (185.4 cm) Weight: 87.6 kg (193 lb 2 oz) IBW/kg (Calculated) : 79.9 HEPARIN  DW (KG): 80.6  Vital Signs: Temp: 99.2 F (37.3 C) (10/19 0400) Temp Source: Axillary (10/19 0400) BP: 159/49 (10/19 0700) Pulse Rate: 98 (10/19 0700)  Labs: Recent Labs    02/02/24 0229 02/02/24 0851 02/03/24 0529 02/03/24 0856 02/03/24 1400 02/03/24 1705 02/04/24 0204 02/04/24 0205  HGB 9.5*  --  8.7*  --   --   --   --  8.1*  HCT 29.7*  --  26.9*  --   --   --   --  25.0*  PLT 540*  --  571*  --   --   --   --  520*  HEPARINUNFRC <0.10*   < > 0.29*  --  0.18* 0.14* 0.24*  --   CREATININE  --    < >  --  2.05*  --   --  2.67* 2.66*   < > = values in this interval not displayed.    Estimated Creatinine Clearance: 28.8 mL/min (A) (by C-G formula based on SCr of 2.66 mg/dL (H)).   Assessment: 73 yo M presenting for esophageal rupture, now s/p esophageal stent placement on 10/6. PMH significant for HFrEF, Afib on Eliquis  PTA. Last dose of Eliquis  01/20/24, time unknown. Pharmacy consulted for heparin  management.  10/15 - underwent EGD with stent reposition on 10/15 after finding esophageal leak  Heparin  level 0.24 remains below goal on 2150 units/hr (26 units/kg).  Heparin  through PIV, peripheral sticks.  Hgb continues to trend down (9.5 > 8.7 > 8.1), pltc 520 stable.  No overt bleeding.  Planning on switching to OAC now tolerating crushed orals.   Goal of Therapy:  Heparin  level 0.3-0.5 units/ml Monitor platelets by anticoagulation  protocol: Yes   Plan:  Stop heparin  infusion START Eliquis  5 mg BID   Maurilio Fila, PharmD Clinical Pharmacist 02/04/2024  8:11 AM

## 2024-02-04 NOTE — Progress Notes (Signed)
 PHARMACY ANTIBIOTIC CONSULT NOTE   Billy [name] a 73 y.o. male admitted on 10/5 with severe epigastric pain found to have esophageal tear now s/p stenting and repositioning.  Was started on empiric Zosyn  and fluconazole now with worsening CXR and O2 requirements.  Pharmacy has been consulted for Merrem and Vancomycin dosing.  10/6 - esophageal stent > started on empiric Zosyn  and fluconazole 10/10 - transitioned to PO Augmentin and fluconzole 10/15 - repositioning of stent, possible aspiration event > restarted Zosyn  10/19 - worsening CXR > stop zosyn , start merrem/vancomycin   Scr 2 > 2.66,  WBC 21 > 17.2  Last fever 10/16, Tm 100.6 MRSA PCR from 10/5 positive  Estimated Creatinine Clearance: 28.8 mL/min (A) (by C-G formula based on SCr of 2.66 mg/dL (H)).  Plan: STOP Zosyn  START Merrem IV 1g Q8h START Vancomycin 2000 mg x1, then variable dosing based on renal function -with SCr 2.66, 1750 mg Q48h would yield eAUC 490 Monitor renal function, clinical status, de-escalation, C/S, levels as indicated   Allergies:  Allergies  Allergen Reactions   Norvasc  [Amlodipine ] Swelling and Other (See Comments)    Excessive BLE swelling Skin discoloration, blotching   Jardiance  [Empagliflozin ] Itching and Other (See Comments)    Patient mentioned penile swelling, pain   Morphine And Codeine Itching    Opioid-induced pruritus    Filed Weights   02/02/24 0600 02/03/24 0500 02/04/24 0500  Weight: 83.8 kg (184 lb 11.9 oz) 84 kg (185 lb 3 oz) 87.6 kg (193 lb 2 oz)       Latest Ref Rng & Units 02/04/2024    2:05 AM 02/03/2024    5:29 AM 02/02/2024    2:29 AM  CBC  WBC 4.0 - 10.5 K/uL 17.2  21.0  18.8   Hemoglobin 13.0 - 17.0 g/dL 8.1  8.7  9.5   Hematocrit 39.0 - 52.0 % 25.0  26.9  29.7   Platelets 150 - 400 K/uL 520  571  540     Antibiotics Given (last 72 hours)     Date/Time Action Medication Dose Rate   02/01/24 1408 New Bag/Given   piperacillin -tazobactam (ZOSYN ) IVPB 3.375 g  3.375 g 12.5 mL/hr   02/01/24 2120 New Bag/Given   piperacillin -tazobactam (ZOSYN ) IVPB 3.375 g 3.375 g 12.5 mL/hr   02/02/24 0559 New Bag/Given   piperacillin -tazobactam (ZOSYN ) IVPB 3.375 g 3.375 g 12.5 mL/hr   02/02/24 1414 New Bag/Given   piperacillin -tazobactam (ZOSYN ) IVPB 3.375 g 3.375 g 12.5 mL/hr   02/02/24 2245 New Bag/Given   piperacillin -tazobactam (ZOSYN ) IVPB 3.375 g 3.375 g 12.5 mL/hr   02/03/24 0543 New Bag/Given   piperacillin -tazobactam (ZOSYN ) IVPB 3.375 g 3.375 g 12.5 mL/hr   02/03/24 1336 New Bag/Given   piperacillin -tazobactam (ZOSYN ) IVPB 3.375 g 3.375 g 12.5 mL/hr   02/03/24 2205 New Bag/Given   piperacillin -tazobactam (ZOSYN ) IVPB 3.375 g 3.375 g 12.5 mL/hr   02/04/24 9472 New Bag/Given   piperacillin -tazobactam (ZOSYN ) IVPB 3.375 g 3.375 g 12.5 mL/hr       Antimicrobials this admission: Zosyn  10/5 > 10/10, 10/14 > 10/19 Fluconazole 10/6 > (planning 6wk) Augmentin 10/10 > 10/14 Merrem 10/19 > c Vancomycin 10/19 > c   Microbiology results: 10/16 pleural fluid: ngtd 10/15 TA: rare candida 10/5 MRSA PCR: positive  Thank you for allowing pharmacy to be a part of this patient's care.  Maurilio Fila, PharmD Clinical Pharmacist 02/04/2024  12:26 PM

## 2024-02-04 NOTE — Progress Notes (Signed)
 PHARMACY - ANTICOAGULATION CONSULT NOTE  Pharmacy Consult for heparin  infusion Indication: atrial fibrillation  Allergies  Allergen Reactions   Norvasc  [Amlodipine ] Swelling and Other (See Comments)    Excessive BLE swelling Skin discoloration, blotching   Jardiance  [Empagliflozin ] Itching and Other (See Comments)    Patient mentioned penile swelling, pain   Morphine And Codeine Itching    Opioid-induced pruritus    Patient Measurements: Height: 6' 1 (185.4 cm) Weight: 84 kg (185 lb 3 oz) IBW/kg (Calculated) : 79.9 HEPARIN  DW (KG): 80.6  Vital Signs: Temp: 99.2 F (37.3 C) (10/19 0316) Temp Source: Axillary (10/19 0316) BP: 120/51 (10/19 0315) Pulse Rate: 76 (10/19 0315)  Labs: Recent Labs    02/01/24 0430 02/01/24 0540 02/02/24 0229 02/02/24 0851 02/03/24 0529 02/03/24 0856 02/03/24 1400 02/03/24 1705 02/04/24 0204 02/04/24 0205  HGB 8.3*  --  9.5*  --  8.7*  --   --   --   --   --   HCT 24.0*  --  29.7*  --  26.9*  --   --   --   --   --   PLT 490*  --  540*  --  571*  --   --   --   --   --   HEPARINUNFRC <0.10*   < > <0.10*   < > 0.29*  --  0.18* 0.14* 0.24*  --   CREATININE  --    < >  --    < >  --  2.05*  --   --  2.67* 2.66*   < > = values in this interval not displayed.    Estimated Creatinine Clearance: 28.8 mL/min (A) (by C-G formula based on SCr of 2.66 mg/dL (H)).   Assessment: 73 yo M presenting for esophageal rupture, now s/p esophageal stent placement on 10/6. PMH significant for HFrEF, Afib on Eliquis  PTA. Last dose of Eliquis  01/20/24, time unknown. Pharmacy consulted for heparin  management.  10/15 - underwent EGD with stent reposition on 10/15 after finding esophageal leak  Heparin  level 0.13 is subtherapeutic on 1700 units/hr.  No issues with infusion or bleeding per RN.  10/19 AM update:  Heparin  level sub-therapeutic  Goal of Therapy:  Heparin  level 0.3-0.5 units/ml Monitor platelets by anticoagulation protocol: Yes   Plan:   Increase heparin  infusion to 2150 units/hr Heparin  level in 8 hours  Lynwood Mckusick, PharmD, BCPS Clinical Pharmacist Phone: [phone]

## 2024-02-04 NOTE — Progress Notes (Signed)
 Patient ID: Billy [name], male   DOB: [birth year], 73 y.o.   MRN: [id number]      Advanced Heart Failure Rounding Note  Cardiologist: Dorn Lesches, MD   Chief Complaint: S/P esophageal stent  Subjective:    - S/p EGD with stent repositioning by Dr. Shyrl 10/15.  - Limited echo 10/17 with EF 50-55%, RV mildly reduced  BP stable, on po hydralazine .    Co-ox 71%, CVP 9, 2L UOP yesterday.     SCr 2.6>>2.4>>2.66 with rising BUN.    He remains in atrial fibrillation with controlled rate on po amiodarone  and heparin  gtt.   He is sedated on ketamine, complains of diffuse pain.   Objective:    Weight Range: 87.6 kg Body mass index is 25.48 kg/m.   Vital Signs:   Temp:  [98.1 F (36.7 C)-99.2 F (37.3 C)] 99.2 F (37.3 C) (10/19 0400) Pulse Rate:  [70-133] 98 (10/19 0700) Resp:  [16-41] 29 (10/19 0700) BP: (92-182)/(39-88) 159/49 (10/19 0700) SpO2:  [78 %-100 %] 96 % (10/19 0700) Weight:  [87.6 kg] 87.6 kg (10/19 0500) Last BM Date : 02/03/24  Weight change: Filed Weights   02/02/24 0600 02/03/24 0500 02/04/24 0500  Weight: 83.8 kg 84 kg 87.6 kg   Intake/Output:  Intake/Output Summary (Last 24 hours) at 02/04/2024 0809 Last data filed at 02/04/2024 0700 Gross per 24 hour  Intake 2355.24 ml  Output 2120 ml  Net 235.24 ml    Physical Exam   CVP 9  General: NAD Neck: JVP 8-9 cm, no thyromegaly or thyroid  nodule.  Lungs: Clear to auscultation bilaterally with normal respiratory effort. CV: Nondisplaced PMI.  Heart irregular S1/S2, no S3/S4, no murmur.  No peripheral edema.   Abdomen: Soft, nontender, no hepatosplenomegaly, no distention.  Skin: Intact without lesions or rashes.  Neurologic: Alert and oriented x 3.  Psych: Normal affect. Extremities: No clubbing or cyanosis.  HEENT: Normal.   Telemetry   Afib 90s, personally reviewed   Labs    CBC Recent Labs    02/03/24 0529 02/04/24 0205  WBC 21.0* 17.2*  HGB 8.7* 8.1*  HCT 26.9* 25.0*  MCV  84.9 85.0  PLT 571* 520*   Basic Metabolic Panel Recent Labs    89/83/74 0936 02/02/24 0851 02/03/24 0856 02/04/24 0204 02/04/24 0205  NA 134*   < > 132* 139 140  K 4.2   < > 4.5 3.8 3.8  CL 100   < > 91* 102 103  CO2 20*   < > 22 23 23   GLUCOSE 160*   < > 1,007* 155* 154*  BUN 97*   < > 112* 126* 125*  CREATININE 2.57*   < > 2.05* 2.67* 2.66*  CALCIUM  8.3*   < > 7.9* 8.0* 8.0*  MG 2.5*  --   --   --  2.4  PHOS 4.1  --  4.8* 4.8*  --    < > = values in this interval not displayed.   Liver Function Tests Recent Labs    02/01/24 0936 02/02/24 0851 02/03/24 0856 02/04/24 0204  AST 38 30  --   --   ALT 45* 35  --   --   ALKPHOS 183* 153*  --   --   BILITOT 0.7 0.4  --   --   PROT 6.1* 5.6*  --   --   ALBUMIN  1.5* <1.5* <1.5* <1.5*   BNP (last 3 results) Recent Labs    03/09/23 1544 03/11/23 0218 06/27/23  1116  BNP 660.9* 491.3* 646.4*   Hemoglobin A1C No results for input(s): HGBA1C in the last 72 hours.  Fasting Lipid Panel Recent Labs    02/02/24 0229  TRIG 42    Medications:    Scheduled Medications:  amiodarone   400 mg Oral BID   atorvastatin   80 mg Per Tube Daily   bisacodyl   10 mg Rectal Q0600   Chlorhexidine  Gluconate Cloth  6 each Topical Daily   hydrALAZINE   25 mg Oral TID   lidocaine   1 patch Transdermal Q24H   oxyCODONE   5 mg Oral Q6H   pantoprazole  (PROTONIX ) IV  40 mg Intravenous QHS   sodium chloride  flush  10 mL Intrapleural Q8H   sodium chloride  flush  10-40 mL Intracatheter Q12H    Infusions:  fluconazole (DIFLUCAN) IV Stopped (02/03/24 1205)   heparin  2,150 Units/hr (02/04/24 0700)   ketamine (KETALAR) adult infusion 0.3 mg/kg/hr (02/04/24 0700)   piperacillin -tazobactam (ZOSYN )  IV 12.5 mL/hr at 02/04/24 0700   potassium chloride      TPN ADULT (ION) 70 mL/hr at 02/04/24 0700    PRN Medications: acetaminophen , ALPRAZolam , hydrALAZINE , HYDROmorphone  (DILAUDID ) injection, levalbuterol, ondansetron  (ZOFRAN ) IV, mouth rinse,  oxyCODONE , sodium chloride  flush  Patient Profile    Patient with longstanding history of chronic heart failure with preserved ejection fraction, RV failure with history of pulmonary embolism presents with esophageal perforation.  S/P Stent Esophageal Stent. Stent repositioned 10/15.  Assessment/Plan  1.  Esophageal Perforation - Boerhaave syndrome.  - CT C/A/P: with pneumomediastinum and large mass-like density in lower esophagus - Prior EGD 3/25: erosive gastritis, duodenal deformity, and bleeding friable duodenal mucosa s/p hemostatic - 01/23/24 S/P EGD with esophageal stent placed  - Stent migrated w/ leak>>back to OR 10/15 for repositioning  - On TPN - Getting sips with meds.  - Continues on Zosyn  + fluconazole.   2. Acute on chronic HFpEF: h/o cardiogenic shock with RV failure in the setting of cardiac arrest thought to be caused by acute PE vs ACS in 3/25. Echo at the time showed EF 55%, severe, LVH, G2DD, and severely reduced RV function with strain. There was concern for cardiac sarcoid amyloid, however CMR LGE consistent with ischemic disease. RV on echo 9/25 with complete recovery, EF 60-65%, nl RV function.    - RHC 3/25: Mild PAH with severe RV failure though CO is preserved.   - Repeat echo 10/17 with EF 50-55%, RV mildly reduced  - CVP 9 today after Lasix  IV yesterday.  Creatinine to 2.66 today with BUN rising.  Will hold off on diuretic for now.  - Holding Entresto  and spiro with AKI.  - GDMT limited by renal function  - On hydralazine  for elevated BP, will not increase.   3. 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 Medical management.  - Atorvastatin  80 mg daily.    4.  AKI on CKD Stage 3b: Baseline sCr ~ 2.1. - Creatinine up to 2.66 today with BUN climbing.  Hold diuretics.     5. PAF:   - s/p TEE/DCCV 3/25 - developed AF with RVR, unsure when as tele is not available. Currently AF w/ CVR  - On po  amiodarone  for rate control.  - Transition from heparin  gtt to Eliquis .   - Not candidate for TEE at this point with esophageal perforation and stent placement.  Would aim for DCCV after 4 wks therapeutic anticoagulation if he remains  in AF.  6.  H/o DVT/PE:  Bilateral upper and lower DVTs, mixed acute and chronic.  - s/p infrarenal IVC placement 3/25 - V/Q 3/25 with multiple perfusion defects - will need lifelong AC, starting Eliquis  today.    7. Carotid stenosis: h/o CVA. TCAR in 6/24. Okay to remain off plavix  at this point. - carotid US  9/25: widely patent carotid stent on R and no stenosis on L   8. ID:  PCT was elevated 10/14 at 1.32. being treated for possible PNA. Now on Zosyn . Also w/ ileus. WBC trending down  - continue abx per CCM   Length of Stay: 14  Ezra Shuck, MD  02/04/2024, 8:09 AM  Advanced Heart Failure Team Pager [phone] (M-F; 7a - 5p)  Please contact [facility] for night-coverage after hours (5p -7a ) and weekends on [email or web address]
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
## 2024-02-05 ENCOUNTER — Inpatient Hospital Stay (HOSPITAL_COMMUNITY)

## 2024-02-05 ENCOUNTER — Other Ambulatory Visit: Payer: Self-pay

## 2024-02-05 DIAGNOSIS — R918 Other nonspecific abnormal finding of lung field: Secondary | ICD-10-CM | POA: Diagnosis not present

## 2024-02-05 DIAGNOSIS — J90 Pleural effusion, not elsewhere classified: Secondary | ICD-10-CM | POA: Diagnosis not present

## 2024-02-05 DIAGNOSIS — N1832 Chronic kidney disease, stage 3b: Secondary | ICD-10-CM | POA: Diagnosis not present

## 2024-02-05 DIAGNOSIS — I4891 Unspecified atrial fibrillation: Secondary | ICD-10-CM | POA: Diagnosis not present

## 2024-02-05 DIAGNOSIS — J9691 Respiratory failure, unspecified with hypoxia: Secondary | ICD-10-CM

## 2024-02-05 DIAGNOSIS — I5032 Chronic diastolic (congestive) heart failure: Secondary | ICD-10-CM | POA: Diagnosis not present

## 2024-02-05 DIAGNOSIS — N179 Acute kidney failure, unspecified: Secondary | ICD-10-CM | POA: Diagnosis not present

## 2024-02-05 DIAGNOSIS — I480 Paroxysmal atrial fibrillation: Secondary | ICD-10-CM | POA: Diagnosis not present

## 2024-02-05 DIAGNOSIS — Z452 Encounter for adjustment and management of vascular access device: Secondary | ICD-10-CM | POA: Diagnosis not present

## 2024-02-05 DIAGNOSIS — K223 Perforation of esophagus: Secondary | ICD-10-CM | POA: Diagnosis not present

## 2024-02-05 DIAGNOSIS — Z4682 Encounter for fitting and adjustment of non-vascular catheter: Secondary | ICD-10-CM | POA: Diagnosis not present

## 2024-02-05 LAB — POCT I-STAT 7, (LYTES, BLD GAS, ICA,H+H)
Acid-base deficit: 1 mmol/L (ref 0.0–2.0)
Bicarbonate: 25.7 mmol/L (ref 20.0–28.0)
Calcium, Ion: 1.22 mmol/L (ref 1.15–1.40)
HCT: 25 % — ABNORMAL LOW (ref 39.0–52.0)
Hemoglobin: 8.5 g/dL — ABNORMAL LOW (ref 13.0–17.0)
O2 Saturation: 73 %
Patient temperature: 98.2
Potassium: 3.8 mmol/L (ref 3.5–5.1)
Sodium: 143 mmol/L (ref 135–145)
TCO2: 27 mmol/L (ref 22–32)
pCO2 arterial: 52.9 mmHg — ABNORMAL HIGH (ref 32–48)
pH, Arterial: 7.292 — ABNORMAL LOW (ref 7.35–7.45)
pO2, Arterial: 43 mmHg — ABNORMAL LOW (ref 83–108)

## 2024-02-05 LAB — RENAL FUNCTION PANEL
Albumin: <1.5 g/dL — ABNORMAL LOW (ref 3.5–5.0)
Anion gap: 14 (ref 5–15)
BUN: 124 mg/dL — ABNORMAL HIGH (ref 8–23)
CO2: 23 mmol/L (ref 22–32)
Calcium: 8.3 mg/dL — ABNORMAL LOW (ref 8.9–10.3)
Chloride: 104 mmol/L (ref 98–111)
Creatinine, Ser: 2.58 mg/dL — ABNORMAL HIGH (ref 0.61–1.24)
GFR, Estimated: 26 mL/min — ABNORMAL LOW (ref >60)
Glucose, Bld: 214 mg/dL — ABNORMAL HIGH (ref 70–99)
Phosphorus: 4.9 mg/dL — ABNORMAL HIGH (ref 2.5–4.6)
Potassium: 3.8 mmol/L (ref 3.5–5.1)
Sodium: 141 mmol/L (ref 135–145)

## 2024-02-05 LAB — BODY FLUID CELL COUNT WITH DIFFERENTIAL
Eos, Fluid: 0 %
Lymphs, Fluid: 0 %
Monocyte-Macrophage-Serous Fluid: 1 % — ABNORMAL LOW (ref 50–90)
Neutrophil Count, Fluid: 99 % — ABNORMAL HIGH (ref 0–25)
Total Nucleated Cell Count, Fluid: 21235 uL — ABNORMAL HIGH (ref 0–1000)

## 2024-02-05 LAB — COOXEMETRY PANEL
Carboxyhemoglobin: 1.9 % — ABNORMAL HIGH (ref 0.5–1.5)
Carboxyhemoglobin: 2.3 % — ABNORMAL HIGH (ref 0.5–1.5)
Methemoglobin: <0.7 % (ref 0.0–1.5)
Methemoglobin: 1.3 % (ref 0.0–1.5)
O2 Saturation: 78.3 %
O2 Saturation: 61.1 %
Total hemoglobin: 7.7 g/dL — ABNORMAL LOW (ref 12.0–16.0)
Total hemoglobin: 7.7 g/dL — ABNORMAL LOW (ref 12.0–16.0)

## 2024-02-05 LAB — COMPREHENSIVE METABOLIC PANEL WITH GFR
ALT: 40 U/L (ref 0–44)
AST: 35 U/L (ref 15–41)
Albumin: <1.5 g/dL — ABNORMAL LOW (ref 3.5–5.0)
Alkaline Phosphatase: 189 U/L — ABNORMAL HIGH (ref 38–126)
Anion gap: 14 (ref 5–15)
BUN: 124 mg/dL — ABNORMAL HIGH (ref 8–23)
CO2: 23 mmol/L (ref 22–32)
Calcium: 8.3 mg/dL — ABNORMAL LOW (ref 8.9–10.3)
Chloride: 105 mmol/L (ref 98–111)
Creatinine, Ser: 2.58 mg/dL — ABNORMAL HIGH (ref 0.61–1.24)
GFR, Estimated: 26 mL/min — ABNORMAL LOW (ref >60)
Glucose, Bld: 220 mg/dL — ABNORMAL HIGH (ref 70–99)
Potassium: 3.8 mmol/L (ref 3.5–5.1)
Sodium: 142 mmol/L (ref 135–145)
Total Bilirubin: 0.6 mg/dL (ref 0.0–1.2)
Total Protein: 5.8 g/dL — ABNORMAL LOW (ref 6.5–8.1)

## 2024-02-05 LAB — BODY FLUID CULTURE W GRAM STAIN
Culture: NO GROWTH
Gram Stain: NONE SEEN

## 2024-02-05 LAB — BASIC METABOLIC PANEL WITH GFR
Anion gap: 11 (ref 5–15)
Anion gap: 12 (ref 5–15)
BUN: 125 mg/dL — ABNORMAL HIGH (ref 8–23)
BUN: 128 mg/dL — ABNORMAL HIGH (ref 8–23)
CO2: 23 mmol/L (ref 22–32)
CO2: 24 mmol/L (ref 22–32)
Calcium: 8.1 mg/dL — ABNORMAL LOW (ref 8.9–10.3)
Calcium: 8.3 mg/dL — ABNORMAL LOW (ref 8.9–10.3)
Chloride: 106 mmol/L (ref 98–111)
Chloride: 109 mmol/L (ref 98–111)
Creatinine, Ser: 2.46 mg/dL — ABNORMAL HIGH (ref 0.61–1.24)
Creatinine, Ser: 2.53 mg/dL — ABNORMAL HIGH (ref 0.61–1.24)
GFR, Estimated: 26 mL/min — ABNORMAL LOW (ref >60)
GFR, Estimated: 27 mL/min — ABNORMAL LOW (ref >60)
Glucose, Bld: 166 mg/dL — ABNORMAL HIGH (ref 70–99)
Glucose, Bld: 180 mg/dL — ABNORMAL HIGH (ref 70–99)
Potassium: 3.7 mmol/L (ref 3.5–5.1)
Potassium: 3.7 mmol/L (ref 3.5–5.1)
Sodium: 141 mmol/L (ref 135–145)
Sodium: 144 mmol/L (ref 135–145)

## 2024-02-05 LAB — AMYLASE, PLEURAL OR PERITONEAL FLUID: Amylase, Fluid: 42 U/L

## 2024-02-05 LAB — HEPARIN LEVEL (UNFRACTIONATED): Heparin Unfractionated: >1.1 [IU]/mL — ABNORMAL HIGH (ref 0.30–0.70)

## 2024-02-05 LAB — ALBUMIN, PLEURAL OR PERITONEAL FLUID: Albumin, Fluid: <1.5 g/dL

## 2024-02-05 LAB — PROTIME-INR
INR: 1.8 — ABNORMAL HIGH (ref 0.8–1.2)
Prothrombin Time: 21.6 s — ABNORMAL HIGH (ref 11.4–15.2)

## 2024-02-05 LAB — MAGNESIUM: Magnesium: 2.6 mg/dL — ABNORMAL HIGH (ref 1.7–2.4)

## 2024-02-05 LAB — CBC
HCT: 25.3 % — ABNORMAL LOW (ref 39.0–52.0)
Hemoglobin: 8 g/dL — ABNORMAL LOW (ref 13.0–17.0)
MCH: 27.5 pg (ref 26.0–34.0)
MCHC: 31.6 g/dL (ref 30.0–36.0)
MCV: 86.9 fL (ref 80.0–100.0)
Platelets: 508 K/uL — ABNORMAL HIGH (ref 150–400)
RBC: 2.91 MIL/uL — ABNORMAL LOW (ref 4.22–5.81)
RDW: 18.1 % — ABNORMAL HIGH (ref 11.5–15.5)
WBC: 17.3 K/uL — ABNORMAL HIGH (ref 4.0–10.5)
nRBC: 0 % (ref 0.0–0.2)

## 2024-02-05 LAB — GLUCOSE, CAPILLARY
Glucose-Capillary: 201 mg/dL — ABNORMAL HIGH (ref 70–99)
Glucose-Capillary: 154 mg/dL — ABNORMAL HIGH (ref 70–99)
Glucose-Capillary: 146 mg/dL — ABNORMAL HIGH (ref 70–99)
Glucose-Capillary: 161 mg/dL — ABNORMAL HIGH (ref 70–99)
Glucose-Capillary: 169 mg/dL — ABNORMAL HIGH (ref 70–99)
Glucose-Capillary: 184 mg/dL — ABNORMAL HIGH (ref 70–99)

## 2024-02-05 LAB — LACTATE DEHYDROGENASE, PLEURAL OR PERITONEAL FLUID: LD, Fluid: >2500 U/L — ABNORMAL HIGH (ref 3–23)

## 2024-02-05 LAB — PHOSPHORUS: Phosphorus: 4.8 mg/dL — ABNORMAL HIGH (ref 2.5–4.6)

## 2024-02-05 LAB — PROTEIN, PLEURAL OR PERITONEAL FLUID: Total protein, fluid: 3.3 g/dL

## 2024-02-05 LAB — GLUCOSE, PLEURAL OR PERITONEAL FLUID: Glucose, Fluid: 25 mg/dL

## 2024-02-05 LAB — CYTOLOGY - NON PAP

## 2024-02-05 LAB — LACTATE DEHYDROGENASE: LDH: 166 U/L (ref 98–192)

## 2024-02-05 LAB — CG4 I-STAT (LACTIC ACID): Lactic Acid, Venous: 0.4 mmol/L — ABNORMAL LOW (ref 0.5–1.9)

## 2024-02-05 LAB — APTT
aPTT: 65 s — ABNORMAL HIGH (ref 24–36)
aPTT: 72 s — ABNORMAL HIGH (ref 24–36)

## 2024-02-05 LAB — TRIGLYCERIDES: Triglycerides: 44 mg/dL (ref <150)

## 2024-02-05 MED ORDER — POTASSIUM CHLORIDE 10 MEQ/50ML IV SOLN
10.0000 meq | INTRAVENOUS | Status: AC
  Administered 2024-02-05 (×2): 10 meq via INTRAVENOUS
  Filled 2024-02-05 (×2): qty 50

## 2024-02-05 MED ORDER — IPRATROPIUM-ALBUTEROL 0.5-2.5 (3) MG/3ML IN SOLN
RESPIRATORY_TRACT | Status: AC
  Administered 2024-02-05: 3 mL
  Filled 2024-02-05: qty 3

## 2024-02-05 MED ORDER — FUROSEMIDE 10 MG/ML IJ SOLN
120.0000 mg | Freq: Once | INTRAVENOUS | Status: AC
  Administered 2024-02-05: 120 mg via INTRAVENOUS
  Filled 2024-02-05: qty 10

## 2024-02-05 MED ORDER — FENTANYL 2500MCG IN NS 250ML (10MCG/ML) PREMIX INFUSION
0.0000 ug/h | INTRAVENOUS | Status: DC
  Administered 2024-02-05: 125 ug/h via INTRAVENOUS
  Administered 2024-02-06: 75 ug/h via INTRAVENOUS
  Administered 2024-02-07: 100 ug/h via INTRAVENOUS
  Filled 2024-02-05 (×3): qty 250

## 2024-02-05 MED ORDER — NOREPINEPHRINE 4 MG/250ML-% IV SOLN
0.0000 ug/min | INTRAVENOUS | Status: DC
  Administered 2024-02-05: 2 ug/min via INTRAVENOUS
  Filled 2024-02-05: qty 250

## 2024-02-05 MED ORDER — NOREPINEPHRINE 4 MG/250ML-% IV SOLN
INTRAVENOUS | Status: AC
  Administered 2024-02-05: 2 ug/min via INTRAVENOUS
  Filled 2024-02-05: qty 250

## 2024-02-05 MED ORDER — FUROSEMIDE 10 MG/ML IJ SOLN
40.0000 mg | Freq: Once | INTRAMUSCULAR | Status: DC
  Filled 2024-02-05: qty 4

## 2024-02-05 MED ORDER — ORAL CARE MOUTH RINSE: 15.0000 mL | OROMUCOSAL | Status: DC | PRN

## 2024-02-05 MED ORDER — DEXMEDETOMIDINE HCL IN NACL 400 MCG/100ML IV SOLN
0.0000 ug/kg/h | INTRAVENOUS | Status: DC
  Administered 2024-02-05 (×2): 0.4 ug/kg/h via INTRAVENOUS
  Administered 2024-02-05: 0.2 ug/kg/h via INTRAVENOUS
  Administered 2024-02-06: 0.4 ug/kg/h via INTRAVENOUS
  Administered 2024-02-06: 0.6 ug/kg/h via INTRAVENOUS
  Administered 2024-02-07: 0.7 ug/kg/h via INTRAVENOUS
  Filled 2024-02-05 (×5): qty 100
  Filled 2024-02-05: qty 1300

## 2024-02-05 MED ORDER — FENTANYL BOLUS VIA INFUSION
25.0000 ug | INTRAVENOUS | Status: DC | PRN
  Administered 2024-02-05: 50 ug via INTRAVENOUS
  Administered 2024-02-05: 25 ug via INTRAVENOUS
  Administered 2024-02-05: 75 ug via INTRAVENOUS
  Administered 2024-02-05: 50 ug via INTRAVENOUS
  Administered 2024-02-05: 75 ug via INTRAVENOUS
  Administered 2024-02-05: 25 ug via INTRAVENOUS
  Administered 2024-02-05: 75 ug via INTRAVENOUS
  Administered 2024-02-06: 100 ug via INTRAVENOUS
  Administered 2024-02-06: 50 ug via INTRAVENOUS
  Administered 2024-02-06 – 2024-02-07 (×5): 100 ug via INTRAVENOUS
  Administered 2024-02-07: 50 ug via INTRAVENOUS
  Administered 2024-02-07 (×4): 100 ug via INTRAVENOUS
  Administered 2024-02-08 (×2): 50 ug via INTRAVENOUS

## 2024-02-05 MED ORDER — PROPOFOL 1000 MG/100ML IV EMUL: 0.0000 ug/kg/min | INTRAVENOUS | Status: DC

## 2024-02-05 MED ORDER — TRACE MINERALS CU-MN-SE-ZN 300-55-60-3000 MCG/ML IV SOLN
INTRAVENOUS | Status: AC
  Filled 2024-02-05: qty 761.6

## 2024-02-05 MED ORDER — EPINEPHRINE 1 MG/10ML IV SOSY
PREFILLED_SYRINGE | INTRAVENOUS | Status: AC
  Filled 2024-02-05: qty 10

## 2024-02-05 MED ORDER — ETOMIDATE 2 MG/ML IV SOLN
INTRAVENOUS | Status: AC
  Administered 2024-02-05: 20 mg
  Filled 2024-02-05: qty 10

## 2024-02-05 MED ORDER — IPRATROPIUM-ALBUTEROL 0.5-2.5 (3) MG/3ML IN SOLN
3.0000 mL | Freq: Four times a day (QID) | RESPIRATORY_TRACT | Status: DC
  Administered 2024-02-05 – 2024-02-13 (×31): 3 mL via RESPIRATORY_TRACT
  Filled 2024-02-05 (×32): qty 3

## 2024-02-05 MED ORDER — FENTANYL 2500MCG IN NS 250ML (10MCG/ML) PREMIX INFUSION
INTRAVENOUS | Status: AC
  Administered 2024-02-05: 25 ug/h via INTRAVENOUS
  Filled 2024-02-05: qty 250

## 2024-02-05 MED ORDER — PROPOFOL 1000 MG/100ML IV EMUL
INTRAVENOUS | Status: AC
  Filled 2024-02-05: qty 100

## 2024-02-05 MED ORDER — ORAL CARE MOUTH RINSE
15.0000 mL | OROMUCOSAL | Status: DC
  Administered 2024-02-05 – 2024-02-08 (×40): 15 mL via OROMUCOSAL

## 2024-02-05 MED ORDER — FUROSEMIDE 10 MG/ML IJ SOLN
80.0000 mg | Freq: Once | INTRAMUSCULAR | Status: AC
  Administered 2024-02-05: 80 mg via INTRAVENOUS
  Filled 2024-02-05: qty 8

## 2024-02-05 MED ORDER — ROCURONIUM BROMIDE 10 MG/ML (PF) SYRINGE
PREFILLED_SYRINGE | INTRAVENOUS | Status: AC
  Administered 2024-02-05: 100 mg
  Filled 2024-02-05: qty 10

## 2024-02-05 NOTE — Progress Notes (Addendum)
 PHARMACY - TOTAL PARENTERAL NUTRITION CONSULT NOTE   Indication: intolerance to enteral feedings, esophageal perforation  Patient Measurements: Height: 6' 1 (185.4 cm) Weight: 83.2 kg (183 lb 6.8 oz) IBW/kg (Calculated) : 79.9 TPN AdjBW (KG): 80.6 Body mass index is 24.2 kg/m.  Assessment:  73 year old man with medical history significant for cardiac arrest earlier this year who was admitted after several days of nausea and vomiting followed by sudden onset severe tearing epigastric pain radiating to back and chest. 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 Pharmacy consulted to initiate TPN due to inability to tolerate enteral nutrition at this time.  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 PTA takes torsemide  20 mg PO daily, now held. Aggressive fluid resuscitation recommended per GI. Per HF, patient currently euvolemic but will hold diuretic for now. Due to CHF will target lower end of fluid range for TPN and supplement additional fluids per MD outside of TPN to prevent fluid overload.   Stent placed on 10/6, esophogram not showing any leak. Diet advanced to CLD. 10/15 Leak present and made NPO.  Glucose / Insulin : A1C 6.0% (02/2023) ;  BGs 139-171, 8 units insulin  in TPN, no SSI.  Electrolytes: Na 140, Cl 103, K 3.8, CO2 23, CoCa up 8.0 [CoCa 10.08], Mg 2.6 (none in TPN), Phos 4.8 Renal: Scr 2.58 down (bsl ~2), BUN 124 Hepatic: AST/ALT trend down 35 / 40, Alk Phos 189, tbili WNL, alb <1.5, TG 44. Intake / Output; MIVF: UOP 0.8 mL/kg/hr, chest tube 121 ml, LBM 10/19 Net IO Since Admission: 14,877.63 mL [02/05/24 1212]  GI Imaging: 10/15 Esoph XR: esophageal perforation/leak at the distal esophagus, proximal to the esophageal stent 10/14 KUB: Gas-filled loops of small bowel and colon suggestive of ileus 10/7 Esoph XR: No leak associated with stent coverage of esophageal perforation  10/06 Esoph XR: distal esophageal perforation 10/05  CT: concerning for esophageal perforation, masslike area of soft tissue thickening adjacent to and surrounding the esophagus  GI Surgeries / Procedures:  10/6: esophageal stent placement  10/15 EGD and stent repositioning   Central access: 01/22/24 TPN start date: 01/22/24  Nutritional Goals: Goal TPN rate is 82 mL/hr (provides 110 g of protein and 2099 kcals per day)  Goal concentrated TPN is 70 ml/hr (provides 114 g of protein and 2100 kcals per day  RD Assessment:  Estimated Needs Total Energy Estimated Needs: 2300-2500 kcals Total Protein Estimated Needs: 115-130 g Total Fluid Estimated Needs: 2L  Current Nutrition:  TPN 10/13- 10/14 DYS1: not taking any po  10/15: NPO d/t increased abd distension   Plan:  Continue concentrated TPN at goal 70 mL/hr at 1800 to meet 100% total nutritional needs (provides 114 g of protein and 2100 kcals per day).   Electrolytes in TPN: Na 95 mEq/L, K 12 mEq/L, Ca 0 mEq/L, Mg 0 mEq/L, Phos 0 mmol/L. Cl:Ac 1:1 Continue standard MVI and trace elements to TPN SSI stopped and decrease BG checks to q8hr  12 units insulin  regular to TPN  Monitor TPN labs on Mon/Thurs, and PRN F/u ability to take PO, wean TPN as able  KCL 10 meq IV x2  Thank you for involving pharmacy in this patient's care.  Sharyne Glatter, PharmD, BCCCP Critical Care Clinical Pharmacist 02/05/2024 12:12 PM

## 2024-02-05 NOTE — Progress Notes (Signed)
 Peripherally Inserted Central Catheter Placement  The IV Nurse has discussed with the patient and/or persons authorized to consent for the patient, the purpose of this procedure and the potential benefits and risks involved with this procedure.  The benefits include less needle sticks, lab draws from the catheter, and the patient may be discharged home with the catheter. Risks include, but not limited to, infection, bleeding, blood clot (thrombus formation), and puncture of an artery; nerve damage and irregular heartbeat and possibility to perform a PICC exchange if needed/ordered by physician.  Alternatives to this procedure were also discussed.  Bard Power PICC patient education guide, fact sheet on infection prevention and patient information card has been provided to patient /or left at bedside.  PICC exchange performed by Maryalice Nephew, RN     PICC Placement Documentation  PICC Triple Lumen 02/05/24 Right Brachial 39 cm 0 cm (Active)  Indication for Insertion or Continuance of Line Vasoactive infusions 02/05/24 1710  Exposed Catheter (cm) 0 cm 02/05/24 1710  Site Assessment Clean, Dry, Intact 02/05/24 1710  Lumen #1 Status Flushed;Saline locked;Blood return noted 02/05/24 1710  Lumen #2 Status Flushed;Saline locked;Blood return noted 02/05/24 1710  Lumen #3 Status Flushed;Saline locked;Blood return noted 02/05/24 1710  Dressing Type Transparent;Securing device 02/05/24 1710  Dressing Status Antimicrobial disc/dressing in [name];Clean, Dry, Intact 02/05/24 1710  Line Care Connections checked and tightened 02/05/24 1710  Line Adjustment (NICU/IV Team Only) No 02/05/24 1710  Dressing Intervention New dressing;Adhesive placed at insertion site (IV team only) 02/05/24 1710  Dressing Change Due 02/12/24 02/05/24 1710       Billy [name], Billy [name] 02/05/2024, 5:10 PM

## 2024-02-05 NOTE — Procedures (Addendum)
 Intubation Procedure Note  Billy [name]  [id number]  [birth year]  Date:02/05/24  Time:3:24 AM   Provider Performing:Moishe Schellenberg    Procedure: Intubation (31500)  Indication(s) Respiratory Failure  Consent Risks of the procedure as well as the alternatives and risks of each were explained to the patient and/or caregiver.  Consent for the procedure was obtained and is signed in the bedside chart   Anesthesia Etomidate  and Rocuronium    Time Out Verified patient identification, verified procedure, site/side was marked, verified correct patient position, special equipment/implants available, medications/allergies/relevant history reviewed, required imaging and test results available.   Sterile Technique Usual hand hygeine, masks, and gloves were used   Procedure Description Patient positioned in bed supine.  Sedation given as noted above.  Patient was intubated with endotracheal tube using Glidescope.  View was Grade 1 full glottis .  Number of attempts was 1.  Colorimetric CO2 detector was consistent with tracheal placement.   Complications/Tolerance None; patient tolerated the procedure well. Chest X-ray is ordered to verify placement.   EBL none   Specimen(s) None

## 2024-02-05 NOTE — Progress Notes (Signed)
 PT Cancellation Note  Patient Details Name: Billy [name] MRN: [id number] DOB: October 16, 1950   Cancelled Treatment:    Reason Eval/Treat Not Completed: Medical issues which prohibited therapy  Spoke with RN, reports pt re-intubated - requests PT hold for today. Will check back tomorrow to continue rehab efforts.  Leontine Roads, PT, DPT [facility]  Rehabilitation Services Physical Therapist Office: [phone] Website: [email or web address]   [name] 02/05/2024, 11:27 AM

## 2024-02-05 NOTE — Progress Notes (Signed)
 Note family spoke with colleague Genie and informed her that they are unable to provide 24/7 support after discharge. They prefer SNF rehab. TOC made aware. AC will sign off.   Tinnie Yvone Cohens, MS, CCC-SLP Admissions Coordinator [phone]

## 2024-02-05 NOTE — Progress Notes (Addendum)
 Patient ID: Billy [name], male   DOB: Jun 29, 1950, 73 y.o.   MRN: [id number]      Advanced Heart Failure Rounding Note  Cardiologist: Dorn Lesches, MD   Chief Complaint: S/P esophageal stent  Subjective:    - S/p EGD with stent repositioning by Dr. Shyrl 10/15.  - Limited echo 10/17 with EF 50-55%, RV mildly reduced  Intubated overnight for respiratory failure, POCUS w/ left pleural effusion, CT placed 75 cc straw colored fluid drained. Abx escalated to vanc + meropenem.   Remains intubated. Awake on vent and following commands. + tremor RUE. B/l LEs cold and dusty   BP stable. No current pressor requirements.   Co-ox 78%   CVP 9-10 today, 1.6L in UOP yesterday   SCr 2.6>>2.4>>2.66>>2.58. BUN 124     Objective:    Weight Range: 83.2 kg Body mass index is 24.2 kg/m.   Vital Signs:   Temp:  [98.2 F (36.8 C)-98.6 F (37 C)] 98.2 F (36.8 C) (10/20 0400) Pulse Rate:  [5-113] 74 (10/20 0700) Resp:  [10-46] 24 (10/20 0700) BP: (86-177)/(40-83) 128/63 (10/20 0700) SpO2:  [57 %-100 %] 100 % (10/20 0700) FiO2 (%):  [50 %-100 %] 50 % (10/20 0724) Weight:  [83.2 kg] 83.2 kg (10/20 0500) Last BM Date : 02/04/24  Weight change: Filed Weights   02/03/24 0500 02/04/24 0500 02/05/24 0500  Weight: 84 kg 87.6 kg 83.2 kg   Intake/Output:  Intake/Output Summary (Last 24 hours) at 02/05/2024 0739 Last data filed at 02/05/2024 0636 Gross per 24 hour  Intake 2815.24 ml  Output 1721 ml  Net 1094.24 ml    Physical Exam   GENERAL: awake on vent and following commands Lungs- intubated and course  CARDIAC: JVP 9-10 cm          Irregularly irregular rhythm and rate trace b/l pretibial edema  ABDOMEN: Soft, non-tender, non-distended.  EXTREMITIES: cold distal LEs, dusty toes bilaterally w/ weak DPs  NEUROLOGIC: awake and follows commands while intubated, + RUE tremor    Telemetry   Afib 90s, personally reviewed   Labs    CBC Recent Labs    02/04/24 0205  02/04/24 2356 02/05/24 0346 02/05/24 0408  WBC 17.2*  --   --  17.3*  HGB 8.1*   < > 8.5* 8.0*  HCT 25.0*   < > 25.0* 25.3*  MCV 85.0  --   --  86.9  PLT 520*  --   --  508*   < > = values in this interval not displayed.   Basic Metabolic Panel Recent Labs    89/80/74 0204 02/04/24 0205 02/04/24 2350 02/04/24 2356 02/05/24 0346 02/05/24 0408  NA 139 140 141   < > 143 142  141  K 3.8 3.8 3.7   < > 3.8 3.8  3.8  CL 102 103 106  --   --  105  104  CO2 23 23 23   --   --  23  23  GLUCOSE 155* 154* 166*  --   --  220*  214*  BUN 126* 125* 125*  --   --  124*  124*  CREATININE 2.67* 2.66* 2.53*  --   --  2.58*  2.58*  CALCIUM  8.0* 8.0* 8.1*  --   --  8.3*  8.3*  MG  --  2.4  --   --   --  2.6*  PHOS 4.8*  --   --   --   --  4.8*  4.9*   < > = values in this interval not displayed.   Liver Function Tests Recent Labs    02/02/24 0851 02/03/24 0856 02/04/24 0204 02/05/24 0408  AST 30  --   --  35  ALT 35  --   --  40  ALKPHOS 153*  --   --  189*  BILITOT 0.4  --   --  0.6  PROT 5.6*  --   --  5.8*  ALBUMIN  <1.5*   < > <1.5* <1.5*  <1.5*   < > = values in this interval not displayed.   BNP (last 3 results) Recent Labs    03/09/23 1544 03/11/23 0218 06/27/23 1116  BNP 660.9* 491.3* 646.4*   Hemoglobin A1C No results for input(s): HGBA1C in the last 72 hours.  Fasting Lipid Panel Recent Labs    02/05/24 0408  TRIG 44    Medications:    Scheduled Medications:  bisacodyl   10 mg Rectal Q0600   Chlorhexidine  Gluconate Cloth  6 each Topical Daily   clonazepam  0.5 mg Oral BID   EPINEPHrine        lidocaine   1 patch Transdermal Q24H   mouth rinse  15 mL Mouth Rinse Q2H   pantoprazole  (PROTONIX ) IV  40 mg Intravenous QHS   sodium chloride  flush  10 mL Intrapleural Q8H   sodium chloride  flush  10-40 mL Intracatheter Q12H   vancomycin variable dose per unstable renal function (pharmacist dosing)   Does not apply See admin instructions     Infusions:  amiodarone  30 mg/hr (02/05/24 0636)   fentaNYL  infusion INTRAVENOUS 125 mcg/hr (02/05/24 0636)   fluconazole (DIFLUCAN) IV Stopped (02/04/24 1221)   heparin  2,150 Units/hr (02/05/24 0653)   meropenem (MERREM) IV 200 mL/hr at 02/05/24 0636   norepinephrine  (LEVOPHED ) Adult infusion Stopped (02/05/24 0217)   propofol  (DIPRIVAN ) infusion     TPN ADULT (ION) 70 mL/hr at 02/05/24 0636    PRN Medications: EPINEPHrine , fentaNYL , hydrALAZINE , HYDROmorphone  (DILAUDID ) injection, levalbuterol, LORazepam , ondansetron  (ZOFRAN ) IV, mouth rinse, sodium chloride  flush  Patient Profile    Patient with longstanding history of chronic heart failure with preserved ejection fraction, RV failure with history of pulmonary embolism presents with esophageal perforation.  S/P Stent Esophageal Stent. Stent repositioned 10/15.  Assessment/Plan  1.  Esophageal Perforation - Boerhaave syndrome.  - CT C/A/P: with pneumomediastinum and large mass-like density in lower esophagus - Prior EGD 3/25: erosive gastritis, duodenal deformity, and bleeding friable duodenal mucosa s/p hemostatic - 01/23/24 S/P EGD with esophageal stent placed  - Stent migrated w/ leak>>back to OR 10/15 for repositioning  - On TPN - Getting sips with meds.  - Continues on fluconazole.  - Abx escalated d/t PNA, now on Vanc + Meropenum     2. Acute on chronic HFpEF: h/o cardiogenic shock with RV failure in the setting of cardiac arrest thought to be caused by acute PE vs ACS in 3/25. Echo at the time showed EF 55%, severe, LVH, G2DD, and severely reduced RV function with strain. There was concern for cardiac sarcoid amyloid, however CMR LGE consistent with ischemic disease. RV on echo 9/25 with complete recovery, EF 60-65%, nl RV function.    - RHC 3/25: Mild PAH with severe RV failure though CO is preserved.   - Repeat echo 10/17 with EF 50-55%, RV mildly reduced  - Co-ox ok at 78%, CVP 9-10, SCr and BUN remain elevated,  2.6/ 124 respectively. Non oliguric. Hold diuretics again today. If SCr/BUN continues  to rise, will need nephrology consut   - Holding Entresto  and spiro with AKI  - GDMT limited by renal function  - On hydralazine  PRN   3. 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 Medical management.  - Atorvastatin  80 mg daily.    4.  AKI on CKD Stage 3b: Baseline sCr ~ 2.1. - Creatinine up to 2.6 today with BUN climbing.  Hold diuretics.     5. PAF:   - s/p TEE/DCCV 3/25 - developed AF with RVR, unsure when as tele is not available. Currently AF w/ CVR  - continue IV amio and IV heparin     - Not candidate for TEE at this point with esophageal perforation and stent placement.  Would aim for DCCV after 4 wks therapeutic anticoagulation if he remains in AF.  6.  H/o DVT/PE:  Bilateral upper and lower DVTs, mixed acute and chronic.  - s/p infrarenal IVC placement 3/25 - V/Q 3/25 with multiple perfusion defects - continue IV heparin . Will need lifelong AC, transition to Eliquis  once more stable     7. Carotid stenosis: h/o CVA. TCAR in 6/24. Okay to remain off plavix  at this point. - carotid US  9/25: widely patent carotid stent on R and no stenosis on L   8. Acute Hypoxic Respiratory Failure/PNA - intubated - on Vanc + meropenum  - CT placed for pleural effusion, fluid Cx pending. Respiratory Cx pending  - vent management per CCM  - check LA level, monitor distal ext closely   CRITICAL CARE Performed by: Caffie Shed   Total critical care time: 15 minutes  Critical care time was exclusive of separately billable procedures and treating other patients.  Critical care was necessary to treat or prevent imminent or life-threatening deterioration.  Critical care was time spent personally by me on the following activities: development of treatment plan with patient and/or surrogate as well as nursing, discussions with  consultants, evaluation of patient's response to treatment, examination of patient, obtaining history from patient or surrogate, ordering and performing treatments and interventions, ordering and review of laboratory studies, ordering and review of radiographic studies, pulse oximetry and re-evaluation of patient's condition.     Length of Stay: [address] PA-C  02/05/2024, 7:39 AM  Advanced Heart Failure Team Pager [phone] (M-F; 7a - 5p)  Please contact [facility] for night-coverage after hours (5p -7a ) and weekends on [email or web address]  Patient seen with PA, I formulated the plan and agree with the above note.   Intubated overnight with suspected aspiration PNA and worsening hypoxemic respiratory failure.  Chest tube on left for pleural effusion, exudative.  He is afebrile today, on vancomycin/meropenem/Diflucan now.    CVP 9-10 with elevated but stable BUN/creatinine.    He is in AF with controlled rate on amiodarone  and heparin  gtts.  Co-ox 78%, stable MAP, now off NE.   General: Intubated.  Neck: JVP 8-9 cm, no thyromegaly or thyroid  nodule.  Lungs: Decreased at bases.  CV: Nondisplaced PMI.  Heart regular S1/S2, no S3/S4, no murmur.  1+ edema 1/2 to knees bilaterally.  Abdomen: Soft, nontender, no hepatosplenomegaly, no distention.  Skin: Intact without lesions or rashes.  Neurologic: Will wake up on vent and follow commands.  Extremities: No clubbing or cyanosis.  HEENT: Normal.   Suspected aspiration PNA, now intubated.  Also with exudate left pleural effusion.  Candida in trach aspirate.  Antimicrobials as above.  Mild volume overload in setting of AKI. BUN/creatinine stable but elevated.  Will give Lasix  80 mg IV x 1 this morning.   AF with controlled rate on amiodarone  gtt and heparin  gtt.   CRITICAL CARE Performed by: Ezra Shuck  Total critical care time: 35 minutes  Critical care time was exclusive of separately billable procedures and treating  other patients.  Critical care was necessary to treat or prevent imminent or life-threatening deterioration.  Critical care was time spent personally by me on the following activities: development of treatment plan with patient and/or surrogate as well as nursing, discussions with consultants, evaluation of patient's response to treatment, examination of patient, obtaining history from patient or surrogate, ordering and performing treatments and interventions, ordering and review of laboratory studies, ordering and review of radiographic studies, pulse oximetry and re-evaluation of patient's condition.  Ezra Shuck 02/05/2024 9:26 AM

## 2024-02-05 NOTE — Progress Notes (Signed)
 Nutrition Follow-up  DOCUMENTATION CODES:   Non-severe (moderate) malnutrition in context of acute illness/injury  INTERVENTION:   TPN to meet nutritional needs -Recommend increasing TPN to meet re-estimated calorie needs  NUTRITION DIAGNOSIS:   Moderate Malnutrition related to acute illness (may have chronic component due to time frame of reported wt loss) as evidenced by mild fat depletion, percent weight loss, energy intake < or equal to 50% for > or equal to 5 days.  Continues but being addressed via TPN  GOAL:   Patient will meet greater than or equal to 90% of their needs  Progressing  MONITOR:   Vent status, Labs, Weight trends, I & O's, Skin (TPN)  REASON FOR ASSESSMENT:   Consult, Rounds New TPN/TNA  ASSESSMENT:   73 yo male admitted post several days of N/V followed by sudden onset of severe epigastric pain radiating to chest and back. Pt found to have small esophageal perforation above GE junction. PMH includes CAD, chronic HFpEF, HTN, HLD, DVT, CVA, PAF, CKD 3b.  10/05 Admitted 10/06 Esophagogram/barium study: distal esophageal perforation, TPN started 10/07 S/p Esophageal stent, TPN at goal rate of 80 ml/hr 10/08  CLD 10/10 Diet advanced to Dysphagia 3, Calorie Count 10/13 No meal tickets for calorie count, pt reports eating bites only 10/15 Esophageal stent migration with leak on esophagram with return to OR for stent repositioning, likely aspiration event requiring intubation 10/16 Extubated, Chest tube placed 10/17 Limited echo with EF 50-55%, RV mildly reduced 10/20 Intubated early AM, L pleural effusion, pigtail chest tube inserted   Re-Intubated this morning, +L pleural effusion Currently sedated on vent  TPN continues at rate of 70 ml/hr  Corrected sodium 144-145 for serum sodium 142, serum glucose 220  UOP 1.6 L plus 1 unmeasured urine occurrence in 24 hours Chest tube inserted overnight with 121 mL out  No OG/NG Recommend barium swallow  post extubation prior to diet advancement   Current Wt: 83.2 kg Admission Wt: 72.8 kg  Labs: BUN 124, Creatinine 2.58 Potassium 3.8 (wdl) Phosphorus 4.8 (H) Magesium 2.6 (H) Albumin  <1.5 TG 44  Meds: Dulcolax Lasix  x 1   Diet Order:   Diet Order             Diet NPO time specified  Diet effective now                   EDUCATION NEEDS:   Education needs have been addressed  Skin:  Skin Assessment: Reviewed RN Assessment  Last BM:  10/19  Height:   Ht Readings from Last 1 Encounters:  01/31/24 6' 1 (1.854 m)    Weight:   Wt Readings from Last 1 Encounters:  02/05/24 83.2 kg    BMI:  Body mass index is 24.2 kg/m.  Estimated Nutritional Needs:   Kcal:  2300-2500 kcals  Protein:  115-130 g  Fluid:  2L    Betsey Finger MS, RDN, LDN, CNSC Registered Dietitian 3 Clinical Nutrition RD Inpatient Contact Info in Amion

## 2024-02-05 NOTE — Progress Notes (Addendum)
 Patient has been tachypneic since beginning of shift, now worsening respiratory rate in the 40s, with obvious chest wall retraction on NRB and heated high flow.  He is encephalopathic and also tells me that he is tired. X-ray shows similar bilateral airspace opacity, and increasing moderate left pleural effusion. Will go ahead and intubate, Also on POCUS left pleural effusion looks organized-will attempt left pleural drainaige. Unable to get hold of the son [name], consent obtained from daughter Alfonso over the phone.   Patient already on antibiotics meropenem, vancomycin, fluconazole Labs briefly reviewed, still leukocytosis, CR 2.5 BUN>125, GFR 26. With the high BUN>100, and fluid overload and initial maginal UOP(now picking up),  will discuss with the day team temporary renal replacement options  Lenny Drought, MD CCM Critcal care time 30 mins

## 2024-02-05 NOTE — Progress Notes (Signed)
 Minimal UOP despite 80 mg IV Lasix  this morning, only ~200 cc this shift.   CVP trending up, 10-12. Remains edematous on exam. Intubated.   Repeat Co-ox 61% Repeat BMP w/ stable SCr at 2.5. K 3.7  Will give 120 mg IV Lasix  x 1. F/u BMP at midnight. If poor response and or further rise in SCr/BUN, will plan nephrology consult in AM.   Caffie Shed, PA-C
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
## 2024-02-05 NOTE — Progress Notes (Signed)
 PHARMACY - ANTICOAGULATION CONSULT NOTE  Pharmacy Consult for heparin  infusion Indication: atrial fibrillation  Allergies  Allergen Reactions   Norvasc  [Amlodipine ] Swelling and Other (See Comments)    Excessive BLE swelling Skin discoloration, blotching   Jardiance  [Empagliflozin ] Itching and Other (See Comments)    Patient mentioned penile swelling, pain   Morphine And Codeine Itching    Opioid-induced pruritus    Patient Measurements: Height: 6' 1 (185.4 cm) Weight: 83.2 kg (183 lb 6.8 oz) IBW/kg (Calculated) : 79.9 HEPARIN  DW (KG): 80.6  Vital Signs: Temp: 98.5 F (36.9 C) (10/20 1957) Temp Source: Axillary (10/20 1957) BP: 122/52 (10/20 1700) Pulse Rate: 66 (10/20 1700)  Labs: Recent Labs    02/03/24 0529 02/03/24 0856 02/03/24 1705 02/04/24 0204 02/04/24 0205 02/04/24 2350 02/04/24 2356 02/05/24 0346 02/05/24 0408 02/05/24 0800 02/05/24 1600 02/05/24 2046  HGB 8.7*  --   --   --  8.1*  --  8.2* 8.5* 8.0*  --   --   --   HCT 26.9*  --   --   --  25.0*  --  24.0* 25.0* 25.3*  --   --   --   PLT 571*  --   --   --  520*  --   --   --  508*  --   --   --   APTT  --   --   --   --   --   --   --   --   --  65*  --  72*  LABPROT  --   --   --   --   --   --   --   --  21.6*  --   --   --   INR  --   --   --   --   --   --   --   --  1.8*  --   --   --   HEPARINUNFRC 0.29*   < > 0.14* 0.24*  --   --   --   --   --  >1.10*  --   --   CREATININE  --    < >  --  2.67* 2.66* 2.53*  --   --  2.58*  2.58*  --  2.46*  --    < > = values in this interval not displayed.    Estimated Creatinine Clearance: 31.1 mL/min (A) (by C-G formula based on SCr of 2.46 mg/dL (H)).   Assessment: 73 yo M presenting for esophageal rupture, now s/p esophageal stent placement on 10/6. PMH significant for HFrEF, Afib on Eliquis  PTA. Last dose of Eliquis  01/20/24, time unknown. Pharmacy consulted for heparin  management.  10/15 - underwent EGD with stent reposition on 10/15 after  finding esophageal leak  02/05/24 PM update: aPTT 72 seconds No signs of bleeding / pauses w/ gtt  Goal of Therapy:  Heparin  level 0.3-0.5 units/ml aPTT 66-85 seconds Monitor platelets by anticoagulation protocol: Yes   Plan:  Continue heparin  to 2250 units/hr Monitor aPTT in 8 hours (AM labs) Monitor heparin  level and aPTT daily until correlating Monitor CBC and s/sx of bleeding daily    Omega Slager BS, PharmD, BCPS Clinical Pharmacist 02/05/2024 9:27 PM  Contact: [phone] after 3 PM

## 2024-02-05 NOTE — Progress Notes (Signed)
 PHARMACY - ANTICOAGULATION CONSULT NOTE  Pharmacy Consult for heparin  infusion Indication: atrial fibrillation  Allergies  Allergen Reactions   Norvasc  [Amlodipine ] Swelling and Other (See Comments)    Excessive BLE swelling Skin discoloration, blotching   Jardiance  Heidi.Haggis ] Itching and Other (See Comments)    Patient mentioned penile swelling, pain   Morphine And Codeine Itching    Opioid-induced pruritus    Patient Measurements: Height: 6' 1 (185.4 cm) Weight: 83.2 kg (183 lb 6.8 oz) IBW/kg (Calculated) : 79.9 HEPARIN  DW (KG): 80.6  Vital Signs: Temp: 99 F (37.2 C) (10/20 0737) Temp Source: Oral (10/20 0737) BP: 128/63 (10/20 0700) Pulse Rate: 74 (10/20 0700)  Labs: Recent Labs    02/03/24 0529 02/03/24 0856 02/03/24 1400 02/03/24 1705 02/04/24 0204 02/04/24 0205 02/04/24 2350 02/04/24 2356 02/05/24 0346 02/05/24 0408 02/05/24 0800  HGB 8.7*  --   --   --   --  8.1*  --  8.2* 8.5* 8.0*  --   HCT 26.9*  --   --   --   --  25.0*  --  24.0* 25.0* 25.3*  --   PLT 571*  --   --   --   --  520*  --   --   --  508*  --   APTT  --   --   --   --   --   --   --   --   --   --  65*  LABPROT  --   --   --   --   --   --   --   --   --  21.6*  --   INR  --   --   --   --   --   --   --   --   --  1.8*  --   HEPARINUNFRC 0.29*  --  0.18* 0.14* 0.24*  --   --   --   --   --   --   CREATININE  --    < >  --   --  2.67* 2.66* 2.53*  --   --  2.58*  2.58*  --    < > = values in this interval not displayed.    Estimated Creatinine Clearance: 29.7 mL/min (A) (by C-G formula based on SCr of 2.58 mg/dL (H)).   Assessment: 73 yo M presenting for esophageal rupture, now s/p esophageal stent placement on 10/6. PMH significant for HFrEF, Afib on Eliquis  PTA. Last dose of Eliquis  01/20/24, time unknown. Pharmacy consulted for heparin  management.  10/15 - underwent EGD with stent reposition on 10/15 after finding esophageal leak  02/05/24 AM: Patient now NPO, heparin   infusion restarted on 10/19 PM. Last dose of Eliquis  administered 02/04/24 AM, will monitor aPTT and heparin  levels until correlating. aPTT 65, slightly subtherapeutic on heparin  2150 units/hr (26 units/kg). No issues with infusion running or signs of bleeding per RN. CBC stable (Hgb 8.0, PLT 508).   Goal of Therapy:  Heparin  level 0.3-0.5 units/ml aPTT 66-85 seconds Monitor platelets by anticoagulation protocol: Yes   Plan:  Increase heparin  to 2250 units/hr Monitor aPTT in 8 hours Monitor heparin  level and aPTT daily until correlating Monitor CBC and s/sx of bleeding daily  Morna Breach, PharmD PGY2 Cardiology Pharmacy Resident 02/05/2024 11:02 AM

## 2024-02-05 NOTE — Procedures (Addendum)
 Insertion of Chest Tube Procedure Note  Jamee Pacholski  [id number]  Aug 07, 1950  Date:02/05/24  Time:3:51 AM    Provider Performing: Lenny Drought   Procedure: Pleural Catheter Insertion w/ Imaging Guidance (67442)  Indication(s) Effusion  Consent Risks of the procedure as well as the alternatives and risks of each were explained to the patient and/or caregiver.  Consent for the procedure was obtained and is signed in the bedside chart  Anesthesia Topical only with 1% lidocaine     Time Out Verified patient identification, verified procedure, site/side was marked, verified correct patient position, special equipment/implants available, medications/allergies/relevant history reviewed, required imaging and test results available.   Sterile Technique Maximal sterile technique including full sterile barrier drape, hand hygiene, sterile gown, sterile gloves, mask, hair covering, sterile ultrasound probe cover (if used).   Procedure Description Ultrasound used to identify appropriate pleural anatomy for placement and overlying skin marked. Area of placement cleaned and draped in sterile fashion.  A 14 French pigtail pleural catheter was placed into the left pleural space using Seldinger technique. Appropriate return of fluid was obtained.  The tube was connected to atrium and placed on -20 cm H2O wall suction. Initial fluid 75cc obtained (straw colored to yellow)and some sent to the lab   Complications/Tolerance None; patient tolerated the procedure well. Chest X-ray is ordered to verify placement.   EBL Less than 5cc  Specimen(s) fluid  Initial fluid 75cc obtained (straw colored to yellow)and some sent to the lab

## 2024-02-05 NOTE — Progress Notes (Signed)
 RT called to bedside to place patient on HHFNC. RT placed patient on HHFNC and gave patient duoneb per MD bedside. Patient continued to have increased WOB and high RR. MD decided to intubate patient.

## 2024-02-06 DIAGNOSIS — K223 Perforation of esophagus: Secondary | ICD-10-CM | POA: Diagnosis not present

## 2024-02-06 DIAGNOSIS — D631 Anemia in chronic kidney disease: Secondary | ICD-10-CM

## 2024-02-06 DIAGNOSIS — N179 Acute kidney failure, unspecified: Secondary | ICD-10-CM | POA: Diagnosis not present

## 2024-02-06 DIAGNOSIS — I4891 Unspecified atrial fibrillation: Secondary | ICD-10-CM | POA: Diagnosis not present

## 2024-02-06 DIAGNOSIS — I5032 Chronic diastolic (congestive) heart failure: Secondary | ICD-10-CM | POA: Diagnosis not present

## 2024-02-06 DIAGNOSIS — N1832 Chronic kidney disease, stage 3b: Secondary | ICD-10-CM | POA: Diagnosis not present

## 2024-02-06 LAB — HEPARIN LEVEL (UNFRACTIONATED): Heparin Unfractionated: >1.1 [IU]/mL — ABNORMAL HIGH (ref 0.30–0.70)

## 2024-02-06 LAB — RENAL FUNCTION PANEL
Albumin: <1.5 g/dL — ABNORMAL LOW (ref 3.5–5.0)
Anion gap: 11 (ref 5–15)
BUN: 133 mg/dL — ABNORMAL HIGH (ref 8–23)
CO2: 23 mmol/L (ref 22–32)
Calcium: 8.1 mg/dL — ABNORMAL LOW (ref 8.9–10.3)
Chloride: 109 mmol/L (ref 98–111)
Creatinine, Ser: 2.39 mg/dL — ABNORMAL HIGH (ref 0.61–1.24)
GFR, Estimated: 28 mL/min — ABNORMAL LOW (ref >60)
Glucose, Bld: 163 mg/dL — ABNORMAL HIGH (ref 70–99)
Phosphorus: 3.8 mg/dL (ref 2.5–4.6)
Potassium: 3.4 mmol/L — ABNORMAL LOW (ref 3.5–5.1)
Sodium: 143 mmol/L (ref 135–145)

## 2024-02-06 LAB — BASIC METABOLIC PANEL WITH GFR
Anion gap: 11 (ref 5–15)
BUN: 129 mg/dL — ABNORMAL HIGH (ref 8–23)
CO2: 23 mmol/L (ref 22–32)
Calcium: 8 mg/dL — ABNORMAL LOW (ref 8.9–10.3)
Chloride: 109 mmol/L (ref 98–111)
Creatinine, Ser: 2.36 mg/dL — ABNORMAL HIGH (ref 0.61–1.24)
GFR, Estimated: 29 mL/min — ABNORMAL LOW (ref >60)
Glucose, Bld: 216 mg/dL — ABNORMAL HIGH (ref 70–99)
Potassium: 3.4 mmol/L — ABNORMAL LOW (ref 3.5–5.1)
Sodium: 143 mmol/L (ref 135–145)

## 2024-02-06 LAB — GLUCOSE, CAPILLARY
Glucose-Capillary: 152 mg/dL — ABNORMAL HIGH (ref 70–99)
Glucose-Capillary: 116 mg/dL — ABNORMAL HIGH (ref 70–99)
Glucose-Capillary: 152 mg/dL — ABNORMAL HIGH (ref 70–99)
Glucose-Capillary: 141 mg/dL — ABNORMAL HIGH (ref 70–99)
Glucose-Capillary: 152 mg/dL — ABNORMAL HIGH (ref 70–99)
Glucose-Capillary: 161 mg/dL — ABNORMAL HIGH (ref 70–99)

## 2024-02-06 LAB — TRIGLYCERIDES, BODY FLUIDS: Triglycerides, Fluid: 46 mg/dL

## 2024-02-06 LAB — MAGNESIUM: Magnesium: 2.7 mg/dL — ABNORMAL HIGH (ref 1.7–2.4)

## 2024-02-06 LAB — CBC
HCT: 20.7 % — ABNORMAL LOW (ref 39.0–52.0)
Hemoglobin: 6.6 g/dL — CL (ref 13.0–17.0)
MCH: 27.4 pg (ref 26.0–34.0)
MCHC: 31.9 g/dL (ref 30.0–36.0)
MCV: 85.9 fL (ref 80.0–100.0)
Platelets: 408 K/uL — ABNORMAL HIGH (ref 150–400)
RBC: 2.41 MIL/uL — ABNORMAL LOW (ref 4.22–5.81)
RDW: 17.8 % — ABNORMAL HIGH (ref 11.5–15.5)
WBC: 9.6 K/uL (ref 4.0–10.5)
nRBC: 0 % (ref 0.0–0.2)

## 2024-02-06 LAB — VANCOMYCIN, RANDOM: Vancomycin Rm: 12 ug/mL

## 2024-02-06 LAB — COOXEMETRY PANEL
Carboxyhemoglobin: 1.4 % (ref 0.5–1.5)
Methemoglobin: 0.9 % (ref 0.0–1.5)
O2 Saturation: 75.3 %
Total hemoglobin: 7.3 g/dL — ABNORMAL LOW (ref 12.0–16.0)

## 2024-02-06 LAB — APTT
aPTT: 118 s — ABNORMAL HIGH (ref 24–36)
aPTT: 116 s — ABNORMAL HIGH (ref 24–36)

## 2024-02-06 LAB — TRIGLYCERIDES: Triglycerides: 32 mg/dL (ref <150)

## 2024-02-06 LAB — PREPARE RBC (CROSSMATCH)

## 2024-02-06 LAB — CYTOLOGY - NON PAP

## 2024-02-06 MED ORDER — TRACE MINERALS CU-MN-SE-ZN 300-55-60-3000 MCG/ML IV SOLN
INTRAVENOUS | Status: AC
  Filled 2024-02-06: qty 761.6

## 2024-02-06 MED ORDER — POTASSIUM CHLORIDE 10 MEQ/50ML IV SOLN
10.0000 meq | INTRAVENOUS | Status: AC
  Administered 2024-02-06 (×2): 10 meq via INTRAVENOUS
  Filled 2024-02-06 (×2): qty 50

## 2024-02-06 MED ORDER — POTASSIUM CHLORIDE 10 MEQ/50ML IV SOLN
10.0000 meq | INTRAVENOUS | Status: AC
  Administered 2024-02-06 (×4): 10 meq via INTRAVENOUS
  Filled 2024-02-06 (×4): qty 50

## 2024-02-06 MED ORDER — SODIUM CHLORIDE 0.9 % IV SOLN
1.0000 g | Freq: Two times a day (BID) | INTRAVENOUS | Status: DC
  Administered 2024-02-06 – 2024-02-12 (×13): 1 g via INTRAVENOUS
  Filled 2024-02-06 (×14): qty 20

## 2024-02-06 MED ORDER — PANTOPRAZOLE SODIUM 40 MG IV SOLR
40.0000 mg | Freq: Two times a day (BID) | INTRAVENOUS | Status: DC
  Administered 2024-02-06 – 2024-02-20 (×29): 40 mg via INTRAVENOUS
  Filled 2024-02-06 (×29): qty 10

## 2024-02-06 MED ORDER — METOLAZONE 5 MG PO TABS
5.0000 mg | ORAL_TABLET | Freq: Once | ORAL | Status: DC
  Filled 2024-02-06: qty 1

## 2024-02-06 MED ORDER — HEPARIN (PORCINE) 25000 UT/250ML-% IV SOLN
1850.0000 [IU]/h | INTRAVENOUS | Status: DC
  Administered 2024-02-06: 2200 [IU]/h via INTRAVENOUS
  Administered 2024-02-06: 2050 [IU]/h via INTRAVENOUS
  Administered 2024-02-07: 1950 [IU]/h via INTRAVENOUS
  Administered 2024-02-08: 1800 [IU]/h via INTRAVENOUS
  Administered 2024-02-08 – 2024-02-09 (×2): 1950 [IU]/h via INTRAVENOUS
  Administered 2024-02-09: 1800 [IU]/h via INTRAVENOUS
  Administered 2024-02-10 – 2024-02-19 (×16): 1950 [IU]/h via INTRAVENOUS
  Administered 2024-02-20: 1850 [IU]/h via INTRAVENOUS
  Filled 2024-02-06 (×24): qty 250

## 2024-02-06 MED ORDER — VANCOMYCIN HCL IN DEXTROSE 1-5 GM/200ML-% IV SOLN
1000.0000 mg | Freq: Once | INTRAVENOUS | Status: AC
  Administered 2024-02-06: 1000 mg via INTRAVENOUS
  Filled 2024-02-06: qty 200

## 2024-02-06 MED ORDER — SODIUM CHLORIDE 0.9% IV SOLUTION: Freq: Once | INTRAVENOUS | Status: AC

## 2024-02-06 MED ORDER — DEXTROSE 5 % IV SOLN
250.0000 mg | Freq: Once | INTRAVENOUS | Status: AC
  Administered 2024-02-06: 250 mg via INTRAVENOUS
  Filled 2024-02-06 (×2): qty 8.9

## 2024-02-06 MED ORDER — FUROSEMIDE 10 MG/ML IJ SOLN
120.0000 mg | Freq: Two times a day (BID) | INTRAVENOUS | Status: AC
  Administered 2024-02-06 (×2): 120 mg via INTRAVENOUS
  Filled 2024-02-06: qty 120
  Filled 2024-02-06: qty 10

## 2024-02-06 NOTE — TOC Progression Note (Signed)
 Transition of Care [facility]) - Progression Note    Patient Details  Name: Eiden Bagot MRN: [id number] Date of Birth: 04/03/1951  Transition of [facility] CM/SW Contact  Justina Delcia Czar, RN Phone Number: [phone] 02/06/2024, 2:46 PM  Clinical Narrative:    Patient continues with vent, amio gtt, TPN. Plan is extubate 02/07/2024. Will have attending review for LTAC.   Chart reviewed for discharge readiness, patient not medically stable for d/c. Inpatient CM/CSW will continue to monitor pt's advancement through interdisciplinary progression rounds.  If new pt transition needs arise, MD please place a TOC consult.    Expected Discharge Plan: Home/Self Care Barriers to Discharge: Continued Medical Work up     Expected Discharge Plan and Services   Discharge Planning Services: CM Consult   Living arrangements for the past 2 months: Single Family Home                                       Social Drivers of Health (SDOH) Interventions SDOH Screenings   Food Insecurity: No Food Insecurity (01/22/2024)  Housing: High Risk (01/22/2024)  Transportation Needs: No Transportation Needs (01/22/2024)  Utilities: Not At Risk (01/22/2024)  Depression (PHQ2-9): Low Risk  (12/01/2020)  Social Connections: Socially Isolated (01/22/2024)  Tobacco Use: Low Risk  (01/31/2024)    Readmission Risk Interventions    06/27/2023    1:52 PM 10/14/2022   11:29 AM  Readmission Risk Prevention Plan  Post Dischage Appt  Complete  Medication Screening  Complete  Transportation Screening Complete Complete  HRI or Home Care Consult Complete   Social Work Consult for Recovery Care Planning/Counseling Complete   Palliative Care Screening Not Applicable   Medication Review Ocean[facility]) Referral to Pharmacy

## 2024-02-06 NOTE — Progress Notes (Signed)
 PHARMACY ANTIBIOTIC CONSULT NOTE   Billy [name] a 73 y.o. male admitted on 10/5 with severe epigastric pain found to have esophageal tear now s/p stenting and repositioning.  Was started on empiric Zosyn  and fluconazole now with worsening CXR and O2 requirements.  Pharmacy has been consulted for Merrem and Vancomycin dosing.  10/6 - esophageal stent > started on empiric Zosyn  and fluconazole 10/10 - transitioned to PO Augmentin and fluconzole 10/15 - repositioning of stent, possible aspiration event > restarted Zosyn  10/19 - worsening CXR > stop zosyn , start merrem/vancomycin   Scr 2.66 > 2.39,  WBC 21 > 9.6 Last 24 hours - Tm 99.9 MRSA PCR from 10/5 positive Vancomycin random 12 on 02/06/24 AM (~38 hours last dose - vancomycin 2000 mg x1)  Estimated Creatinine Clearance: 32 mL/min (A) (by C-G formula based on SCr of 2.39 mg/dL (H)).  Plan: Continue Merrem IV 1g Q8h Vancomycin 1000 mg x1, recheck vancomycin level in 24 hours -with SCr 2.39, 1000 mg Q24h would yield eAUC 521 Monitor renal function, clinical status, de-escalation, C/S, levels as indicated   Allergies:  Allergies  Allergen Reactions   Norvasc  [Amlodipine ] Swelling and Other (See Comments)    Excessive BLE swelling Skin discoloration, blotching   Jardiance  [Empagliflozin ] Itching and Other (See Comments)    Patient mentioned penile swelling, pain   Morphine And Codeine Itching    Opioid-induced pruritus    Filed Weights   02/03/24 0500 02/04/24 0500 02/05/24 0500  Weight: 84 kg (185 lb 3 oz) 87.6 kg (193 lb 2 oz) 83.2 kg (183 lb 6.8 oz)       Latest Ref Rng & Units 02/06/2024    5:00 AM 02/05/2024    4:08 AM 02/05/2024    3:46 AM  CBC  WBC 4.0 - 10.5 K/uL 9.6  17.3    Hemoglobin 13.0 - 17.0 g/dL 6.6  8.0  8.5   Hematocrit 39.0 - 52.0 % 20.7  25.3  25.0   Platelets 150 - 400 K/uL 408  508      Antibiotics Given (last 72 hours)     Date/Time Action Medication Dose Rate   02/03/24 1336 New Bag/Given    piperacillin -tazobactam (ZOSYN ) IVPB 3.375 g 3.375 g 12.5 mL/hr   02/03/24 2205 New Bag/Given   piperacillin -tazobactam (ZOSYN ) IVPB 3.375 g 3.375 g 12.5 mL/hr   02/04/24 0527 New Bag/Given   piperacillin -tazobactam (ZOSYN ) IVPB 3.375 g 3.375 g 12.5 mL/hr   02/04/24 1423 New Bag/Given   vancomycin (VANCOREADY) IVPB 2000 mg/400 mL 2,000 mg 200 mL/hr   02/04/24 1429 New Bag/Given   meropenem (MERREM) 1 g in sodium chloride  0.9 % 100 mL IVPB 1 g 200 mL/hr   02/04/24 2212 New Bag/Given   meropenem (MERREM) 1 g in sodium chloride  0.9 % 100 mL IVPB 1 g 200 mL/hr   02/05/24 9386 New Bag/Given   meropenem (MERREM) 1 g in sodium chloride  0.9 % 100 mL IVPB 1 g 200 mL/hr   02/05/24 1317 New Bag/Given   meropenem (MERREM) 1 g in sodium chloride  0.9 % 100 mL IVPB 1 g 200 mL/hr   02/05/24 2121 New Bag/Given   meropenem (MERREM) 1 g in sodium chloride  0.9 % 100 mL IVPB 1 g 200 mL/hr       Antimicrobials this admission: Zosyn  10/5 > 10/10, 10/14 > 10/19 Fluconazole 10/6 > (planning 6wk) Augmentin 10/10 > 10/14 Merrem 10/19 > c Vancomycin 10/19 > c   Microbiology results: 10/16 pleural fluid: ngtd 10/15 TA: rare  candida 10/5 MRSA PCR: positive  Thank you for allowing pharmacy to be a part of this patient's care.  Morna Breach, PharmD PGY2 Cardiology Pharmacy Resident 02/06/2024 6:29 AM

## 2024-02-06 NOTE — Progress Notes (Signed)
 eLink Physician-Brief Progress Note Patient Name: Billy [name] DOB: [birth year] MRN: [id number]   Date of Service  02/06/2024  HPI/Events of Note  Notified of Hgb 6.6, no bleeding reported K 3.4, creatinine 2.39  eICU Interventions  Ordered to transfuse 1 unit PRBC, consent to be obtained by bedside team Already has order for K total 40 meqs Discussed with BSRN     Intervention Category Intermediate Interventions: Other:;Electrolyte abnormality - evaluation and management  Damien ONEIDA Grout 02/06/2024, 6:53 AM
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
## 2024-02-06 NOTE — Progress Notes (Addendum)
 Patient ID: Billy [name], male   DOB: 07/11/1950, 73 y.o.   MRN: [id number]      Advanced Heart Failure Rounding Note  Cardiologist: Dorn Lesches, MD   Chief Complaint: S/P esophageal stent  Subjective:    - S/p EGD with stent repositioning by Dr. Shyrl 10/15.  - Limited echo 10/17 with EF 50-55%, RV mildly reduced  Remains intubated for aspiration PNA. On Vanc + meropenum. WBC improved, 17>>9K   Sedated. Will open eyes on command.   Co-ox 75%   1.2L in UOP yesterday w/ high dose IV Lasix  but overall net + 2.3L for the day. CVP reading 6-7 but w/ 3rd spacing. Edematous on exam. Wt up 20 lb from admission wt.   SCr 2.6>>2.4>>2.66>>2.58>2.39. BUN 124>>133 (getting TPN)   Hgb 6.6. No gross bleeding. No melena/hematochezia w/ stool output.       Objective:    Weight Range: 83.2 kg Body mass index is 24.2 kg/m.   Vital Signs:   Temp:  [98 F (36.7 C)-99.9 F (37.7 C)] 98 F (36.7 C) (10/21 0805) Pulse Rate:  [51-88] 64 (10/21 0730) Resp:  [19-26] 26 (10/21 0730) BP: (89-152)/(42-78) 137/50 (10/21 0730) SpO2:  [88 %-100 %] 100 % (10/21 0730) FiO2 (%):  [40 %-50 %] 40 % (10/21 0730) Last BM Date : 02/04/24  Weight change: Filed Weights   02/03/24 0500 02/04/24 0500 02/05/24 0500  Weight: 84 kg 87.6 kg 83.2 kg   Intake/Output:  Intake/Output Summary (Last 24 hours) at 02/06/2024 0820 Last data filed at 02/06/2024 0700 Gross per 24 hour  Intake 3267.95 ml  Output 1110 ml  Net 2157.95 ml    Physical Exam   CVP 6-7  GENERAL: intubated and sedated  Lungs- intubated, diminished at bases  CARDIAC:  JVP: 7 cm          Normal rate with regular rhythm. No MRG. ABDOMEN: Soft, non-tender, non-distended.  EXTREMITIES: 1-2+ b/l edema up to thighs + TEDs  +RUE PICC  NEUROLOGIC: sedated on vent, opens eyes to command     Telemetry   NSR 60s, personally reviewed   Labs    CBC Recent Labs    02/05/24 0408 02/06/24 0500  WBC 17.3* 9.6  HGB 8.0* 6.6*  HCT  25.3* 20.7*  MCV 86.9 85.9  PLT 508* 408*   Basic Metabolic Panel Recent Labs    89/79/74 0408 02/05/24 1600 02/05/24 2354 02/06/24 0500  NA 142  141   < > 143 143  K 3.8  3.8   < > 3.4* 3.4*  CL 105  104   < > 109 109  CO2 23  23   < > 23 23  GLUCOSE 220*  214*   < > 216* 163*  BUN 124*  124*   < > 129* 133*  CREATININE 2.58*  2.58*   < > 2.36* 2.39*  CALCIUM  8.3*  8.3*   < > 8.0* 8.1*  MG 2.6*  --   --  2.7*  PHOS 4.8*  4.9*  --   --  3.8   < > = values in this interval not displayed.   Liver Function Tests Recent Labs    02/05/24 0408 02/06/24 0500  AST 35  --   ALT 40  --   ALKPHOS 189*  --   BILITOT 0.6  --   PROT 5.8*  --   ALBUMIN  <1.5*  <1.5* <1.5*   BNP (last 3 results) Recent Labs    03/09/23 1544  03/11/23 0218 06/27/23 1116  BNP 660.9* 491.3* 646.4*   Hemoglobin A1C No results for input(s): HGBA1C in the last 72 hours.  Fasting Lipid Panel Recent Labs    02/06/24 0500  TRIG 32    Medications:    Scheduled Medications:  sodium chloride    Intravenous Once   bisacodyl   10 mg Rectal Q0600   Chlorhexidine  Gluconate Cloth  6 each Topical Daily   ipratropium-albuterol   3 mL Nebulization Q6H   lidocaine   1 patch Transdermal Q24H   mouth rinse  15 mL Mouth Rinse Q2H   pantoprazole  (PROTONIX ) IV  40 mg Intravenous QHS   sodium chloride  flush  10 mL Intrapleural Q8H   sodium chloride  flush  10-40 mL Intracatheter Q12H   vancomycin variable dose per unstable renal function (pharmacist dosing)   Does not apply See admin instructions    Infusions:  amiodarone  30 mg/hr (02/06/24 0700)   dexmedetomidine  (PRECEDEX ) IV infusion 0.4 mcg/kg/hr (02/06/24 0736)   fentaNYL  infusion INTRAVENOUS 125 mcg/hr (02/06/24 0700)   fluconazole (DIFLUCAN) IV Stopped (02/05/24 1039)   meropenem (MERREM) IV 200 mL/hr at 02/06/24 0700   norepinephrine  (LEVOPHED ) Adult infusion Stopped (02/06/24 0408)   potassium chloride  10 mEq (02/06/24 0751)   TPN ADULT  (ION) Stopped (02/06/24 0422)   vancomycin 1,000 mg (02/06/24 0753)    PRN Medications: fentaNYL , hydrALAZINE , HYDROmorphone  (DILAUDID ) injection, levalbuterol, LORazepam , ondansetron  (ZOFRAN ) IV, mouth rinse, sodium chloride  flush  Patient Profile    Patient with longstanding history of chronic heart failure with preserved ejection fraction, RV failure with history of pulmonary embolism presents with esophageal perforation.  S/P Stent Esophageal Stent. Stent repositioned 10/15.  Assessment/Plan   1.  Esophageal Perforation - Boerhaave syndrome.  - CT C/A/P: with pneumomediastinum and large mass-like density in lower esophagus - Prior EGD 3/25: erosive gastritis, duodenal deformity, and bleeding friable duodenal mucosa s/p hemostatic - 01/23/24 S/P EGD with esophageal stent placed  - Stent migrated w/ leak>>back to OR 10/15 for repositioning  - On TPN - Continues on fluconazole.  - Abx escalated d/t PNA, now on Vanc + Meropenum     2. Acute on chronic HFpEF: h/o cardiogenic shock with RV failure in the setting of cardiac arrest thought to be caused by acute PE vs ACS in 3/25. Echo at the time showed EF 55%, severe, LVH, G2DD, and severely reduced RV function with strain. There was concern for cardiac sarcoid amyloid, however CMR LGE consistent with ischemic disease. RV on echo 9/25 with complete recovery, EF 60-65%, nl RV function.    - RHC 3/25: Mild PAH with severe RV failure though CO is preserved.   - Repeat echo 10/17 with EF 50-55%, RV mildly reduced  - Co-ox ok at 75% - CVP 6-7 but w/ 3rd spacing. Edematous on exam. Wt up 23 lb from admit wt. Continue diuresis IV Lasix  120 mg bid + 5 mg of metolazone + K supp. - Continue TEDs   - GDMT limited by renal function  - On hydralazine  PRN   3. 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 Medical management.  - Atorvastatin  80 mg daily.    4.  AKI on CKD Stage 3b:  Baseline sCr ~ 2.1. - Creatinine up to 2.6 yesterday with BUN climbing. SCr down to 2.4 today w/ diuresis  - continue IV Lasix  for renal venous decongestion - d/w CCM at bedside, Doubt rising BUN d/t GIB but will  closely monitor. TPN likely contributing  - follow BMP     5. PAF:   - s/p TEE/DCCV 3/25 - developed AF with RVR, unsure when as tele is not available. Currently AF w/ CVR  - continue IV amio and IV heparin     - Not candidate for TEE at this point with esophageal perforation and stent placement.  Would aim for DCCV after 4 wks therapeutic anticoagulation if he remains in AF.  6.  H/o DVT/PE:  Bilateral upper and lower DVTs, mixed acute and chronic.  - s/p infrarenal IVC placement 3/25 - V/Q 3/25 with multiple perfusion defects - continue IV heparin . Will need lifelong AC, transition to Eliquis  once more stable     7. Carotid stenosis: h/o CVA. TCAR in 6/24. Okay to remain off plavix  at this point. - carotid US  9/25: widely patent carotid stent on R and no stenosis on L   8. Acute Hypoxic Respiratory Failure/PNA - intubated - on Vanc + meropenum  - CT placed for pleural effusion, fluid Cx pending. Respiratory Cx NGTD  - vent management per CCM    CRITICAL CARE Performed by: Caffie Shed   Total critical care time: 15 minutes  Critical care time was exclusive of separately billable procedures and treating other patients.  Critical care was necessary to treat or prevent imminent or life-threatening deterioration.  Critical care was time spent personally by me on the following activities: development of treatment plan with patient and/or surrogate as well as nursing, discussions with consultants, evaluation of patient's response to treatment, examination of patient, obtaining history from patient or surrogate, ordering and performing treatments and interventions, ordering and review of laboratory studies, ordering and review of radiographic studies, pulse oximetry and  re-evaluation of patient's condition.   Length of Stay: [address] PA-C  02/06/2024, 8:20 AM  Advanced Heart Failure Team Pager [phone] (M-F; 7a - 5p)  Please contact [facility] for night-coverage after hours (5p -7a ) and weekends on [email or web address]  Patient seen with PA, I formulated the plan and agree with the above note.   Co-ox 75%, not on pressors.  He remains intubated for aspiration PNA and is on vancomycin/meropenem.  I/Os positive yesterday despite IV Lasix .  CVP 13 today.  BUN and creatinine stable at 2.39.   He remains in AF on amiodarone  gtt and heparin .   Hgb 6.6, no overt GI bleeding.   General: NAD Neck: JVP 12-14, no thyromegaly or thyroid  nodule.  Lungs: Clear to auscultation bilaterally with normal respiratory effort. CV: Nondisplaced PMI.  Heart regular S1/S2, no S3/S4, no murmur. 1+ edema to knees.     Abdomen: Soft, nontender, no hepatosplenomegaly, no distention.  Skin: Intact without lesions or rashes.  Neurologic: Alert and oriented x 3.  Psych: Normal affect. Extremities: No clubbing or cyanosis.  HEENT: Normal.   Suspected aspiration PNA, now intubated.  Also with exudative left pleural effusion.  Candida in trach aspirate.  Antimicrobials as above.    Volume overload in setting of AKI, CVP 13-14 with weight significantly up. BUN/creatinine stable but elevated.  Will give Lasix  120 mg IV bid + Diuril 250 mg IV.    AF with controlled rate on amiodarone  gtt and heparin  gtt.  Hgb 6.6, no overt bleeding.  Will hold heparin  for an hour and restart a lower dose.  Will need 1 unit PRBCs.   CRITICAL CARE Performed by: Ezra Shuck  Total critical care time: 35 minutes  Critical care time was exclusive of  separately billable procedures and treating other patients.  Critical care was necessary to treat or prevent imminent or life-threatening deterioration.  Critical care was time spent personally by me on the following activities:  development of treatment plan with patient and/or surrogate as well as nursing, discussions with consultants, evaluation of patient's response to treatment, examination of patient, obtaining history from patient or surrogate, ordering and performing treatments and interventions, ordering and review of laboratory studies, ordering and review of radiographic studies, pulse oximetry and re-evaluation of patient's condition.  Ezra Shuck 02/06/2024 10:26 AM

## 2024-02-06 NOTE — Progress Notes (Signed)
 PT Cancellation Note  Patient Details Name: Billy [name] MRN: [id number] DOB: 1950/06/25   Cancelled Treatment:    Reason Eval/Treat Not Completed: Other (comment)  RN reports pt getting incredibly anxious with minimal movement in bed. Planning to extubate tomorrow - may have better participation after extubation.  Will plan to follow-up then. Please send me a secure chat if earlier visit requested.  Leontine Roads, PT, DPT [facility] Physical Therapist Office: [phone] Website: [email or web address]   [name] 02/06/2024, 11:43 AM

## 2024-02-06 NOTE — Progress Notes (Signed)
 PHARMACY - ANTICOAGULATION CONSULT NOTE  Pharmacy Consult for heparin  infusion Indication: atrial fibrillation  Allergies  Allergen Reactions   Norvasc  [Amlodipine ] Swelling and Other (See Comments)    Excessive BLE swelling Skin discoloration, blotching   Jardiance  [Empagliflozin ] Itching and Other (See Comments)    Patient mentioned penile swelling, pain   Morphine And Codeine Itching    Opioid-induced pruritus    Patient Measurements: Height: 6' 1 (185.4 cm) Weight: 83.2 kg (183 lb 6.8 oz) IBW/kg (Calculated) : 79.9 HEPARIN  DW (KG): 80.6  Vital Signs: Temp: 97.7 F (36.5 C) (10/21 1541) Temp Source: Oral (10/21 1600) BP: 123/57 (10/21 1545) Pulse Rate: 58 (10/21 1545)  Labs: Recent Labs    02/04/24 0204 02/04/24 0205 02/04/24 2350 02/05/24 0346 02/05/24 0408 02/05/24 0408 02/05/24 0800 02/05/24 1600 02/05/24 2046 02/05/24 2354 02/06/24 0500 02/06/24 1640  HGB  --  8.1*   < > 8.5* 8.0*  --   --   --   --   --  6.6*  --   HCT  --  25.0*   < > 25.0* 25.3*  --   --   --   --   --  20.7*  --   PLT  --  520*  --   --  508*  --   --   --   --   --  408*  --   APTT  --   --   --   --   --    < > 65*  --  72*  --  118* 116*  LABPROT  --   --   --   --  21.6*  --   --   --   --   --   --   --   INR  --   --   --   --  1.8*  --   --   --   --   --   --   --   HEPARINUNFRC 0.24*  --   --   --   --   --  >1.10*  --   --   --  >1.10*  --   CREATININE 2.67* 2.66*   < >  --  2.58*  2.58*  --   --  2.46*  --  2.36* 2.39*  --    < > = values in this interval not displayed.    Estimated Creatinine Clearance: 32 mL/min (A) (by C-G formula based on SCr of 2.39 mg/dL (H)).   Assessment: 73 yo M presenting for esophageal rupture, now s/p esophageal stent placement on 10/6. PMH significant for HFrEF, Afib on Eliquis  PTA. Last dose of Eliquis  01/20/24, time unknown. Pharmacy consulted for heparin  management.  10/15 - underwent EGD with stent reposition on 10/15 after finding  esophageal leak  02/06/24 AM: Heparin  level >1.10 and aPTT 118, both supratherapeutic at heparin  2250 units/hr. Noted patient last received Eliquis  on 10/19. Will monitor heparin  levels and aPTT until therapeutic and correlating. No issues with infusion running  noted. Hgb decreased to 6.6, PLT stable at 408. Discussed with AHF and CCM team, plan to continue heparin  today with low suspicion for GI bleed. No over bleeding per RN.   02/06/24 PM: aPTT 116 remains supratherapeutic. No bleeding reported per RN.  Goal of Therapy:  Heparin  level 0.3-0.5 units/ml aPTT 66-85 seconds Monitor platelets by anticoagulation protocol: Yes   Plan:  Decrease heparin  to 2050 units/hr Monitor aPTT in 8 hours Monitor  heparin  level and aPTT daily until correlating Monitor CBC and s/sx of bleeding daily  Rocky Slade, PharmD, BCPS 02/06/2024 5:26 PM  Contact: [phone] after 3 PM

## 2024-02-06 NOTE — Progress Notes (Signed)
 eLink Physician-Brief Progress Note Patient Name: Billy [name] DOB: 11-Sep-1950 MRN: [id number]   Date of Service  02/06/2024  HPI/Events of Note  Notified of lab results from 23:34 with K+ 3.4    Cr 2.36 On vent with PICC No OG/NG as s/p repair of esophageal tear and had to be reintubated last night  eICU Interventions  Bedside team has placed on order for KCl  total 20 meqs Discussed with BSRN     Intervention Category Intermediate Interventions: Electrolyte abnormality - evaluation and management  Damien ONEIDA Grout 02/06/2024, 1:37 AM

## 2024-02-06 NOTE — Progress Notes (Signed)
 PHARMACY - ANTICOAGULATION CONSULT NOTE  Pharmacy Consult for heparin  infusion Indication: atrial fibrillation  Allergies  Allergen Reactions   Norvasc  [Amlodipine ] Swelling and Other (See Comments)    Excessive BLE swelling Skin discoloration, blotching   Jardiance  [Empagliflozin ] Itching and Other (See Comments)    Patient mentioned penile swelling, pain   Morphine And Codeine Itching    Opioid-induced pruritus    Patient Measurements: Height: 6' 1 (185.4 cm) Weight: 83.2 kg (183 lb 6.8 oz) IBW/kg (Calculated) : 79.9 HEPARIN  DW (KG): 80.6  Vital Signs: Temp: 98.5 F (36.9 C) (10/21 0320) Temp Source: Axillary (10/21 0320) BP: 104/49 (10/21 0515) Pulse Rate: 54 (10/21 0515)  Labs: Recent Labs    02/04/24 0204 02/04/24 0205 02/04/24 2350 02/05/24 0346 02/05/24 0408 02/05/24 0800 02/05/24 1600 02/05/24 2046 02/05/24 2354 02/06/24 0500  HGB  --  8.1*   < > 8.5* 8.0*  --   --   --   --  6.6*  HCT  --  25.0*   < > 25.0* 25.3*  --   --   --   --  20.7*  PLT  --  520*  --   --  508*  --   --   --   --  408*  APTT  --   --   --   --   --  65*  --  72*  --  118*  LABPROT  --   --   --   --  21.6*  --   --   --   --   --   INR  --   --   --   --  1.8*  --   --   --   --   --   HEPARINUNFRC 0.24*  --   --   --   --  >1.10*  --   --   --  >1.10*  CREATININE 2.67* 2.66*   < >  --  2.58*  2.58*  --  2.46*  --  2.36* 2.39*   < > = values in this interval not displayed.    Estimated Creatinine Clearance: 32 mL/min (A) (by C-G formula based on SCr of 2.39 mg/dL (H)).   Assessment: 73 yo M presenting for esophageal rupture, now s/p esophageal stent placement on 10/6. PMH significant for HFrEF, Afib on Eliquis  PTA. Last dose of Eliquis  01/20/24, time unknown. Pharmacy consulted for heparin  management.  10/15 - underwent EGD with stent reposition on 10/15 after finding esophageal leak  02/06/24 AM: Heparin  level >1.10 and aPTT 118, both supratherapeutic at heparin  2250  units/hr. Noted patient last received Eliquis  on 10/19. Will monitor heparin  levels and aPTT until therapeutic and correlating. No issues with infusion running  noted. Hgb decreased to 6.6, PLT stable at 408. Discussed with AHF and CCM team, plan to continue heparin  today with low suspicion for GI bleed. No over bleeding per RN.   Goal of Therapy:  Heparin  level 0.3-0.5 units/ml aPTT 66-85 seconds Monitor platelets by anticoagulation protocol: Yes   Plan:  Hold heparin  x1 hours Decrease heparin  to 2200 units/hr Monitor aPTT in 8 hours Monitor heparin  level and aPTT daily until correlating Monitor CBC and s/sx of bleeding daily  Morna Breach, PharmD PGY2 Cardiology Pharmacy Resident 02/06/2024 6:30 AM  Contact: [phone] after 3 PM

## 2024-02-06 NOTE — Progress Notes (Addendum)
 PHARMACY - TOTAL PARENTERAL NUTRITION CONSULT NOTE   Indication: intolerance to enteral feedings, esophageal perforation  Patient Measurements: Height: 6' 1 (185.4 cm) Weight: 83.2 kg (183 lb 6.8 oz) IBW/kg (Calculated) : 79.9 TPN AdjBW (KG): 80.6 Body mass index is 24.2 kg/m.  Assessment:  73 year old man with medical history significant for cardiac arrest earlier this year who was admitted after several days of nausea and vomiting followed by sudden onset severe tearing epigastric pain radiating to back and chest. 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 Pharmacy consulted to initiate TPN due to inability to tolerate enteral nutrition at this time.  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 PTA takes torsemide  20 mg PO daily, now held. Aggressive fluid resuscitation recommended per GI. Per HF, patient currently euvolemic but will hold diuretic for now. Due to CHF will target lower end of fluid range for TPN and supplement additional fluids per MD outside of TPN to prevent fluid overload.   Stent placed on 10/6, esophogram not showing any leak. Diet advanced to CLD. 10/15 Leak present and made NPO.  Glucose / Insulin : A1C 6.0% (02/2023) ;  BGs 154-216, 12 units insulin  in TPN, no SSI.  Electrolytes: Na 143, K 3.4, CO2 23, CoCa 10.1, Mg 2.6 (none in TPN), Phos 3.8 Renal: Scr 2.39 down (bsl ~2), BUN 133- received lasix  80 x1 then 120 x1 Hepatic: AST/ALT trend down 35 / 40, Alk Phos 189, tbili WNL, alb <1.5, TG 44. Intake / Output; MIVF: UOP 0.4 mL/kg/hr, chest tube 46 ml, LBM 10/20 Net IO Since Admission: 17,368.56 mL [02/06/24 0729]  GI Imaging: 10/15 Esoph XR: esophageal perforation/leak at the distal esophagus, proximal to the esophageal stent 10/14 KUB: Gas-filled loops of small bowel and colon suggestive of ileus 10/7 Esoph XR: No leak associated with stent coverage of esophageal perforation  10/06 Esoph XR: distal esophageal  perforation 10/05 CT: concerning for esophageal perforation, masslike area of soft tissue thickening adjacent to and surrounding the esophagus  GI Surgeries / Procedures:  10/6: esophageal stent placement  10/15 EGD and stent repositioning   Central access: 01/22/24 TPN start date: 01/22/24  Nutritional Goals: Goal TPN rate is 82 mL/hr (provides 110 g of protein and 2099 kcals per day)  Goal concentrated TPN is 70 ml/hr (provides 114 g of protein and 2100 kcals per day  RD Assessment:  Estimated Needs Total Energy Estimated Needs: 2300-2500 kcals Total Protein Estimated Needs: 115-130 g Total Fluid Estimated Needs: 2L  Current Nutrition:  TPN 10/13- 10/14 DYS1: not taking any po  10/15: NPO d/t increased abd distension   Plan:  Continue concentrated TPN at goal 70 mL/hr at 1800 to meet 100% total nutritional needs (provides 114 g of protein and 2100 kcals per day).   Electrolytes in TPN: Na 85 mEq/L, K 18 mEq/L, Ca 0 mEq/L, Mg 0 mEq/L, Phos 0 mmol/L. Cl:Ac 1:2 Continue standard MVI and trace elements to TPN SSI stopped and decrease BG checks to q8hr  15 units insulin  regular to TPN  Monitor TPN labs on Mon/Thurs, and PRN F/u ability to take PO, wean TPN as able  KCL 10 meq IV x4 per MD this AM  Plan for lasix  120 x2 and metolazone x1 per MD notes  Thank you for involving pharmacy in this patient's care.  Sharyne Glatter, PharmD, BCCCP Critical Care Clinical Pharmacist 02/06/2024 7:29 AM   Addendum  --TPN needs adjusted/increased per RD exam today 10/21.  --Plan to address  in TPN orders tomorrow to provide goal nutrition

## 2024-02-07 ENCOUNTER — Telehealth (HOSPITAL_COMMUNITY): Payer: Self-pay | Admitting: Cardiology

## 2024-02-07 ENCOUNTER — Inpatient Hospital Stay (HOSPITAL_COMMUNITY)

## 2024-02-07 DIAGNOSIS — K223 Perforation of esophagus: Secondary | ICD-10-CM | POA: Diagnosis not present

## 2024-02-07 DIAGNOSIS — I5033 Acute on chronic diastolic (congestive) heart failure: Secondary | ICD-10-CM | POA: Diagnosis not present

## 2024-02-07 LAB — BPAM RBC
Blood Product Expiration Date: 202511132359
ISSUE DATE / TIME: 202510211115
Unit Type and Rh: 6200

## 2024-02-07 LAB — GLUCOSE, CAPILLARY
Glucose-Capillary: 123 mg/dL — ABNORMAL HIGH (ref 70–99)
Glucose-Capillary: 134 mg/dL — ABNORMAL HIGH (ref 70–99)
Glucose-Capillary: 157 mg/dL — ABNORMAL HIGH (ref 70–99)
Glucose-Capillary: 166 mg/dL — ABNORMAL HIGH (ref 70–99)
Glucose-Capillary: 166 mg/dL — ABNORMAL HIGH (ref 70–99)
Glucose-Capillary: 99 mg/dL (ref 70–99)

## 2024-02-07 LAB — HEPARIN LEVEL (UNFRACTIONATED)
Heparin Unfractionated: 0.64 [IU]/mL (ref 0.30–0.70)
Heparin Unfractionated: 0.43 [IU]/mL (ref 0.30–0.70)

## 2024-02-07 LAB — CBC
HCT: 24.5 % — ABNORMAL LOW (ref 39.0–52.0)
Hemoglobin: 8 g/dL — ABNORMAL LOW (ref 13.0–17.0)
MCH: 28.2 pg (ref 26.0–34.0)
MCHC: 32.7 g/dL (ref 30.0–36.0)
MCV: 86.3 fL (ref 80.0–100.0)
Platelets: 401 K/uL — ABNORMAL HIGH (ref 150–400)
RBC: 2.84 MIL/uL — ABNORMAL LOW (ref 4.22–5.81)
RDW: 17.7 % — ABNORMAL HIGH (ref 11.5–15.5)
WBC: 9.7 K/uL (ref 4.0–10.5)
nRBC: 0 % (ref 0.0–0.2)

## 2024-02-07 LAB — TYPE AND SCREEN
ABO/RH(D): A POS
Antibody Screen: NEGATIVE
Unit division: 0

## 2024-02-07 LAB — COOXEMETRY PANEL
Carboxyhemoglobin: 0.8 % (ref 0.5–1.5)
Methemoglobin: 1.2 % (ref 0.0–1.5)
O2 Saturation: 73.1 %
Total hemoglobin: 8.5 g/dL — ABNORMAL LOW (ref 12.0–16.0)

## 2024-02-07 LAB — APTT
aPTT: 90 s — ABNORMAL HIGH (ref 24–36)
aPTT: 97 s — ABNORMAL HIGH (ref 24–36)

## 2024-02-07 LAB — RENAL FUNCTION PANEL
Albumin: <1.5 g/dL — ABNORMAL LOW (ref 3.5–5.0)
Anion gap: 13 (ref 5–15)
BUN: 128 mg/dL — ABNORMAL HIGH (ref 8–23)
CO2: 23 mmol/L (ref 22–32)
Calcium: 8.3 mg/dL — ABNORMAL LOW (ref 8.9–10.3)
Chloride: 108 mmol/L (ref 98–111)
Creatinine, Ser: 2.19 mg/dL — ABNORMAL HIGH (ref 0.61–1.24)
GFR, Estimated: 31 mL/min — ABNORMAL LOW (ref >60)
Glucose, Bld: 182 mg/dL — ABNORMAL HIGH (ref 70–99)
Phosphorus: 3.2 mg/dL (ref 2.5–4.6)
Potassium: 3.5 mmol/L (ref 3.5–5.1)
Sodium: 144 mmol/L (ref 135–145)

## 2024-02-07 LAB — VANCOMYCIN, RANDOM: Vancomycin Rm: 17 ug/mL

## 2024-02-07 MED ORDER — FUROSEMIDE 10 MG/ML IJ SOLN
120.0000 mg | Freq: Three times a day (TID) | INTRAVENOUS | Status: AC
  Administered 2024-02-07 (×3): 120 mg via INTRAVENOUS
  Filled 2024-02-07: qty 120
  Filled 2024-02-07: qty 10
  Filled 2024-02-07: qty 2

## 2024-02-07 MED ORDER — POTASSIUM CHLORIDE 10 MEQ/50ML IV SOLN
10.0000 meq | INTRAVENOUS | Status: AC
  Administered 2024-02-07 (×4): 10 meq via INTRAVENOUS
  Filled 2024-02-07 (×4): qty 50

## 2024-02-07 MED ORDER — TRACE MINERALS CU-MN-SE-ZN 300-55-60-3000 MCG/ML IV SOLN
INTRAVENOUS | Status: AC
  Filled 2024-02-07: qty 772.8

## 2024-02-07 MED ORDER — POTASSIUM CHLORIDE CRYS ER 20 MEQ PO TBCR: 40.0000 meq | EXTENDED_RELEASE_TABLET | Freq: Once | ORAL | Status: DC

## 2024-02-07 MED ORDER — VANCOMYCIN HCL IN DEXTROSE 1-5 GM/200ML-% IV SOLN
1000.0000 mg | INTRAVENOUS | Status: DC
  Administered 2024-02-07 – 2024-02-09 (×3): 1000 mg via INTRAVENOUS
  Filled 2024-02-07 (×3): qty 200

## 2024-02-07 MED ORDER — PROPOFOL 1000 MG/100ML IV EMUL
0.0000 ug/kg/min | INTRAVENOUS | Status: DC
Start: 2024-02-07 — End: 2024-02-08
  Administered 2024-02-07: 20 ug/kg/min via INTRAVENOUS
  Administered 2024-02-07: 10 ug/kg/min via INTRAVENOUS
  Administered 2024-02-08: 40 ug/kg/min via INTRAVENOUS
  Filled 2024-02-07 (×3): qty 100

## 2024-02-07 NOTE — Progress Notes (Addendum)
 PHARMACY ANTIBIOTIC CONSULT NOTE   Billy [name] a 73 y.o. male admitted on 10/5 with severe epigastric pain found to have esophageal tear now s/p stenting and repositioning.  Was started on empiric Zosyn  and fluconazole now with worsening CXR and O2 requirements.  Pharmacy has been consulted for Merrem and Vancomycin dosing.  10/6 - esophageal stent > started on empiric Zosyn  and fluconazole 10/10 - transitioned to PO Augmentin and fluconzole 10/15 - repositioning of stent, possible aspiration event > restarted Zosyn  10/19 - worsening CXR > stop zosyn , start merrem/vancomycin   Scr 2.66 > 2.19,  WBC 21 > 9.7 Last 24 hours - Tm 99.1 MRSA PCR from 10/5 positive Vancomycin random 17 on 02/07/24 AM (~20 hours last dose - vancomycin 1000 mg x1) With renal function stabilizing and improving, will schedule vancomycin and recheck levels at steady state.   Estimated Creatinine Clearance: 35 mL/min (A) (by C-G formula based on SCr of 2.19 mg/dL (H)).  Plan: Continue Merrem IV 1g Q8h Vancomycin 1000 mg every 24 hours -with SCr 2.19, 1000 mg Q24h would yield eAUC 493 Monitor renal function, clinical status, de-escalation, C/S, levels as indicated   Allergies:  Allergies  Allergen Reactions   Norvasc  [Amlodipine ] Swelling and Other (See Comments)    Excessive BLE swelling Skin discoloration, blotching   Jardiance  [Empagliflozin ] Itching and Other (See Comments)    Patient mentioned penile swelling, pain   Morphine And Codeine Itching    Opioid-induced pruritus    Filed Weights   02/04/24 0500 02/05/24 0500 02/07/24 0500  Weight: 87.6 kg (193 lb 2 oz) 83.2 kg (183 lb 6.8 oz) 84.3 kg (185 lb 13.6 oz)       Latest Ref Rng & Units 02/07/2024    4:47 AM 02/06/2024    5:00 AM 02/05/2024    4:08 AM  CBC  WBC 4.0 - 10.5 K/uL 9.7  9.6  17.3   Hemoglobin 13.0 - 17.0 g/dL 8.0  6.6  8.0   Hematocrit 39.0 - 52.0 % 24.5  20.7  25.3   Platelets 150 - 400 K/uL 401  408  508     Antibiotics Given  (last 72 hours)     Date/Time Action Medication Dose Rate   02/04/24 1423 New Bag/Given   vancomycin (VANCOREADY) IVPB 2000 mg/400 mL 2,000 mg 200 mL/hr   02/04/24 1429 New Bag/Given   meropenem (MERREM) 1 g in sodium chloride  0.9 % 100 mL IVPB 1 g 200 mL/hr   02/04/24 2212 New Bag/Given   meropenem (MERREM) 1 g in sodium chloride  0.9 % 100 mL IVPB 1 g 200 mL/hr   02/05/24 9386 New Bag/Given   meropenem (MERREM) 1 g in sodium chloride  0.9 % 100 mL IVPB 1 g 200 mL/hr   02/05/24 1317 New Bag/Given   meropenem (MERREM) 1 g in sodium chloride  0.9 % 100 mL IVPB 1 g 200 mL/hr   02/05/24 2121 New Bag/Given   meropenem (MERREM) 1 g in sodium chloride  0.9 % 100 mL IVPB 1 g 200 mL/hr   02/06/24 0653 New Bag/Given   meropenem (MERREM) 1 g in sodium chloride  0.9 % 100 mL IVPB 1 g 200 mL/hr   02/06/24 0753 New Bag/Given   vancomycin (VANCOCIN) IVPB 1000 mg/200 mL premix 1,000 mg 200 mL/hr   02/06/24 2146 New Bag/Given   meropenem (MERREM) 1 g in sodium chloride  0.9 % 100 mL IVPB 1 g 200 mL/hr       Antimicrobials this admission: Zosyn  10/5 > 10/10, 10/14 >  10/19 Fluconazole 10/6 > (planning 6wk) Augmentin 10/10 > 10/14 Merrem 10/19 > c Vancomycin 10/19 > c   Microbiology results: 10/16 pleural fluid: ngtd 10/15 TA: rare candida 10/5 MRSA PCR: positive 10/20 pleural fluid: ngtd  Thank you for allowing pharmacy to be a part of this patient's care.  Morna Breach, PharmD PGY2 Cardiology Pharmacy Resident 02/07/2024 6:26 AM

## 2024-02-07 NOTE — Progress Notes (Addendum)
 PHARMACY - TOTAL PARENTERAL NUTRITION CONSULT NOTE   Indication: intolerance to enteral feedings, esophageal perforation  Patient Measurements: Height: 6' 1 (185.4 cm) Weight: 84.3 kg (185 lb 13.6 oz) IBW/kg (Calculated) : 79.9 TPN AdjBW (KG): 80.6 Body mass index is 24.52 kg/m.  Assessment:  73 year old man with medical history significant for cardiac arrest earlier this year who was admitted after several days of nausea and vomiting followed by sudden onset severe tearing epigastric pain radiating to back and chest. 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 Pharmacy consulted to initiate TPN due to inability to tolerate enteral nutrition at this time.  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 PTA takes torsemide  20 mg PO daily, now held. Aggressive fluid resuscitation recommended per GI. Per HF, patient currently euvolemic but will hold diuretic for now. Due to CHF will target lower end of fluid range for TPN and supplement additional fluids per MD outside of TPN to prevent fluid overload.   Stent placed on 10/6, esophogram not showing any leak. Diet advanced to [facility]D. 10/15 Leak present and made NPO.  Glucose / Insulin : A1C 6.0% (02/2023) ;  BGs 116-182, 15 units insulin  in TPN, no SSI.  Electrolytes:  K 3.5 (Received 40 mEq IV > 40 mEq ordered); CoCa 10.3 (none in TPN), Mg 2.7 (none in TPN), phos 3.2 down, others wnl Renal: Scr 2.19 down (bsl ~2), BUN 128   Hepatic: AST/ALT/tbili wnl, Alk Phos 189, alb <1.5, TG 32 Intake / Output; MIVF: chlorothiazide, furosemide  120mg  x2;UOP 1.8 mL/kg/hr, chest tube 142 ml, LBM 10/20 Net IO Since Admission: 17,371.39 mL [02/07/24 0718]  - weight up 25 lbs since admit  GI Imaging: 10/05 CT: concerning for esophageal perforation, masslike area of soft tissue thickening adjacent to and surrounding the esophagus  10/06 Esoph XR: distal esophageal perforation 10/7 Esoph XR: No leak associated with stent  coverage of esophageal perforation 10/14 KUB: Gas-filled loops of small bowel and colon suggestive of ileus 10/15 Esoph XR: esophageal perforation/leak at the distal esophagus, proximal to the esophageal stent GI Surgeries / Procedures:  10/6: esophageal stent placement  10/15 EGD and stent repositioning   Central access: 01/22/24 TPN start date: 01/22/24  Nutritional Goals:  Goal concentrated TPN is 70 ml/hr (provides 116 g of protein and 2307 kcals per day  RD Assessment:  Estimated Needs Total Energy Estimated Needs: 2300-2500 kcals Total Protein Estimated Needs: 115-130 g Total Fluid Estimated Needs: 2L  Current Nutrition:  TPN 10/13- 10/14 DYS1: not taking any po  10/15: NPO d/t increased abd distension   Plan:  Adjusted for new RD recs: continue concentrated TPN at goal 70 mL/hr at 1800 to meet 100% estimated nutritional needs  Electrolytes in TPN: decrease Na 50 mEq/L, increase K 40 mEq/L, Ca 0 mEq/L, Mg 0 mEq/L, increase Phos 6 mmol/L. Cl:Ac 1:2 Continue standard MVI and trace elements to TPN Add 18 units insulin  regular to TPN Continue BG checks to q8hr  Monitor TPN labs on Mon/Thurs, and PRN   ADDENDUM 13:00- dexmedetomidine  changed to propofol  after TPN made. Will adjust lipids 10/23.   Thank you for involving pharmacy in this patient's care.  Jinnie Door, PharmD, BCPS, BCCP Clinical Pharmacist  Please check AMION for all [facility] Pharmacy phone numbers After 10:00 PM, call Main Pharmacy [phone]

## 2024-02-07 NOTE — Progress Notes (Signed)
 eLink Physician-Brief Progress Note Patient Name: Billy [name] DOB: 05/19/1950 MRN: [id number]   Date of Service  02/07/2024  HPI/Events of Note  Potassium 3.5, creatinine 2.19  eICU Interventions  Additional KCl this morning.  TPN adjustment per pharmacy/dietary     Intervention Category Minor Interventions: Electrolytes abnormality - evaluation and management  Zaid Tomes 02/07/2024, 6:48 AM

## 2024-02-07 NOTE — Progress Notes (Signed)
 Occupational Therapy Treatment Patient Details Name: Billy [name] MRN: [id number] DOB: [birth year] Today's Date: 02/07/2024   History of present illness Billy [name] is a 73 y.o. male who presented to [facility] ED 01/21/24 with several days of vomiting followed by an episode of severe tearing epigastric pain. Work-up revealed esophageal tear. Pt s/p esophageal stent with EGD 10/7. Return to OR for esophageal stent adjustment on 10/15, remained intubated after procedure due to suspected aspiration. Extubated 10/16.  Pt had CT placed 10/16. Reintubated 10/20. PMHx: HFrEF, AFib, CAD, PE complicated by cardiogenic shock, CKD 4, MI, peripheral neuropathy, and TIA.   OT comments  Pt initially lethargic due to sedating medication, RN adjusted with pt able to nod head in response to yes/no questions. Completed ROM B UE and positioned pt in upright position in bed eliciting some coughing. Goals downgraded to reflect complicated medical course and physical decline. Pt to be extubated tomorrow per RN. Will continue to follow.       If plan is discharge home, recommend the following:  Two people to help with walking and/or transfers;Two people to help with bathing/dressing/bathroom;Assistance with cooking/housework;Assistance with feeding;Direct supervision/assist for medications management;Direct supervision/assist for financial management;Assist for transportation;Help with stairs or ramp for entrance   Equipment Recommendations  Other (comment) (defer)    Recommendations for Other Services      Precautions / Restrictions Precautions Precautions: Fall Restrictions Weight Bearing Restrictions Per Provider Order: No       Mobility Bed Mobility Overal bed mobility: Needs Assistance             General bed mobility comments: +2 max assist to pull trunk away from bed with  [facility] elevated    Transfers                   General transfer comment: deferred     Balance                                            ADL either performed or assessed with clinical judgement   ADL                                         General ADL Comments: dependent    Extremity/Trunk Assessment              Vision       Perception     Praxis     Communication Communication Communication: Impaired Factors Affecting Communication: Trach/intubated;Other (comment) (nodding head)   Cognition Arousal: Lethargic, Suspect due to medications Behavior During Therapy: Flat affect Cognition: Difficult to assess Difficult to assess due to: Level of arousal, Intubated                               Following commands impaired: Only follows one step commands consistently      Cueing   Cueing Techniques: Verbal cues  Exercises Exercises: General Upper Extremity General Exercises - Upper Extremity Shoulder Flexion: PROM, Both, 5 reps, Supine Elbow Flexion: PROM, Both, 5 reps, Supine Elbow Extension: PROM, Both, 5 reps, Supine Wrist Flexion: PROM, Both, 5 reps, Supine Wrist Extension: PROM, Both, 5 reps, Supine Digit Composite Flexion: PROM, Both, 5 reps, Supine Composite Extension: PROM, Both, 5  reps, Supine    Shoulder Instructions       General Comments      Pertinent Vitals/ Pain       Pain Assessment Pain Assessment: Faces Faces Pain Scale: No hurt  Home Living                                          Prior Functioning/Environment              Frequency  Min 2X/week        Progress Toward Goals  OT Goals(current goals can now be found in the care plan section)  Progress towards OT goals: Progressing toward goals  Acute Rehab OT Goals OT Goal Formulation: With patient Time For Goal Achievement: 02/21/24 Potential to Achieve Goals: Fair ADL Goals Pt Will Perform Grooming: with supervision;sitting Pt Will Perform Upper Body Dressing: with min assist;sitting Pt Will Transfer to Toilet: with mod  assist;stand pivot transfer Pt Will Perform Toileting - Clothing Manipulation and hygiene: with mod assist;sitting/lateral leans Additional ADL Goal #1: Pt will complete bed mobility with mod assist in preparation for ADLs.  Plan      Co-evaluation                 AM-PAC OT 6 Clicks Daily Activity     Outcome Measure   Help from another person eating meals?: Total Help from another person taking care of personal grooming?: Total Help from another person toileting, which includes using toliet, bedpan, or urinal?: Total Help from another person bathing (including washing, rinsing, drying)?: Total Help from another person to put on and taking off regular upper body clothing?: Total Help from another person to put on and taking off regular lower body clothing?: Total 6 Click Score: 6    End of Session    OT Visit Diagnosis: Muscle weakness (generalized) (M62.81)   Activity Tolerance     Patient Left in bed;with call bell/phone within reach;with bed alarm set   Nurse Communication Other (comment) (reduced propofol  for session)        Time: 8492-8465 OT Time Calculation (min): 27 min  Charges: OT General Charges $OT Visit: 1 Visit OT Treatments $Therapeutic Exercise: 8-22 mins  Mliss HERO, OTR/L Acute Rehabilitation Services Office: [phone]   Kennth Mliss Helling 02/07/2024, 3:46 PM

## 2024-02-07 NOTE — Progress Notes (Addendum)
 Patient ID: Billy [name], male   DOB: 27-Aug-1950, 73 y.o.   MRN: [id number]      Advanced Heart Failure Rounding Note  Cardiologist: Dorn Lesches, MD   Chief Complaint: S/P esophageal stent  Subjective:    - S/p EGD with stent repositioning by Dr. Shyrl 10/15.  - Limited echo 10/17 with EF 50-55%, RV mildly reduced  Remains intubated, awake on vent and follows commands.  Continues on Vanc+ mero + fluconazole for aspiration PNA Respiratory Cx +rare yeast BCx NGTD    Co-ox 73%   3.6L in UOP yesterday w/ high dose IV Lasix , though net + 335cc (high intake from TPN + all meds IV + RBC transfusion). CVP 8-9, repeat CXR today still w/ pulmonary edema    SCr 2.6>>2.4>>2.66>>2.58>2.39>>2.19. BUN 124>>133>>128  K 3.5   Transfused 1u RBCs yesterday, Hgb 6.6>>8.0 today. No gross bleeding.    Objective:    Weight Range: 84.3 kg Body mass index is 24.52 kg/m.   Vital Signs:   Temp:  [97.7 F (36.5 C)-99.1 F (37.3 C)] 98.4 F (36.9 C) (10/22 0753) Pulse Rate:  [50-73] 50 (10/22 0700) Resp:  [12-25] 24 (10/22 0700) BP: (98-155)/(42-142) 127/57 (10/22 0700) SpO2:  [95 %-100 %] 100 % (10/22 0800) FiO2 (%):  [40 %] 40 % (10/22 0800) Weight:  [84.3 kg] 84.3 kg (10/22 0500) Last BM Date : 02/06/24  Weight change: Filed Weights   02/04/24 0500 02/05/24 0500 02/07/24 0500  Weight: 87.6 kg 83.2 kg 84.3 kg   Intake/Output:  Intake/Output Summary (Last 24 hours) at 02/07/2024 0825 Last data filed at 02/07/2024 0700 Gross per 24 hour  Intake 3838.9 ml  Output 3697 ml  Net 141.9 ml    Physical Exam   CVP 8-9 GENERAL: intubated, awake and calm on vent Lungs- intubated and clear  CARDIAC:  JVP 9 cm           Normal rate with regular rhythm. No MRG,  ABDOMEN: Soft, non-tender, non-distended.  EXTREMITIES: trace b/l pretibial edema, + unna boots  NEUROLOGIC: awake on vent, follows commands  Telemetry   NSR 70s, personally reviewed   Labs    CBC Recent Labs     02/06/24 0500 02/07/24 0447  WBC 9.6 9.7  HGB 6.6* 8.0*  HCT 20.7* 24.5*  MCV 85.9 86.3  PLT 408* 401*   Basic Metabolic Panel Recent Labs    89/79/74 0408 02/05/24 1600 02/06/24 0500 02/07/24 0447  NA 142  141   < > 143 144  K 3.8  3.8   < > 3.4* 3.5  CL 105  104   < > 109 108  CO2 23  23   < > 23 23  GLUCOSE 220*  214*   < > 163* 182*  BUN 124*  124*   < > 133* 128*  CREATININE 2.58*  2.58*   < > 2.39* 2.19*  CALCIUM  8.3*  8.3*   < > 8.1* 8.3*  MG 2.6*  --  2.7*  --   PHOS 4.8*  4.9*  --  3.8 3.2   < > = values in this interval not displayed.   Liver Function Tests Recent Labs    02/05/24 0408 02/06/24 0500 02/07/24 0447  AST 35  --   --   ALT 40  --   --   ALKPHOS 189*  --   --   BILITOT 0.6  --   --   PROT 5.8*  --   --  ALBUMIN  <1.5*  <1.5* <1.5* <1.5*   BNP (last 3 results) Recent Labs    03/09/23 1544 03/11/23 0218 06/27/23 1116  BNP 660.9* 491.3* 646.4*   Hemoglobin A1C No results for input(s): HGBA1C in the last 72 hours.  Fasting Lipid Panel Recent Labs    02/06/24 0500  TRIG 32    Medications:    Scheduled Medications:  bisacodyl   10 mg Rectal Q0600   Chlorhexidine  Gluconate Cloth  6 each Topical Daily   ipratropium-albuterol   3 mL Nebulization Q6H   lidocaine   1 patch Transdermal Q24H   mouth rinse  15 mL Mouth Rinse Q2H   pantoprazole  (PROTONIX ) IV  40 mg Intravenous Q12H   sodium chloride  flush  10 mL Intrapleural Q8H   sodium chloride  flush  10-40 mL Intracatheter Q12H    Infusions:  amiodarone  30 mg/hr (02/07/24 0700)   dexmedetomidine  (PRECEDEX ) IV infusion 0.3 mcg/kg/hr (02/07/24 0700)   fentaNYL  infusion INTRAVENOUS 100 mcg/hr (02/07/24 0700)   fluconazole (DIFLUCAN) IV Stopped (02/06/24 1111)   heparin  2,050 Units/hr (02/07/24 0700)   meropenem (MERREM) IV Stopped (02/06/24 2216)   norepinephrine  (LEVOPHED ) Adult infusion Stopped (02/06/24 0408)   potassium chloride  10 mEq (02/07/24 0702)   TPN ADULT  (ION) Stopped (02/07/24 0345)   TPN ADULT (ION)     vancomycin      PRN Medications: fentaNYL , hydrALAZINE , HYDROmorphone  (DILAUDID ) injection, levalbuterol, LORazepam , ondansetron  (ZOFRAN ) IV, mouth rinse, sodium chloride  flush  Patient Profile    Patient with longstanding history of chronic heart failure with preserved ejection fraction, RV failure with history of pulmonary embolism presents with esophageal perforation.  S/P Stent Esophageal Stent. Stent repositioned 10/15.  Assessment/Plan   1.  Esophageal Perforation - Boerhaave syndrome.  - CT C/A/P: with pneumomediastinum and large mass-like density in lower esophagus - Prior EGD 3/25: erosive gastritis, duodenal deformity, and bleeding friable duodenal mucosa s/p hemostatic - 01/23/24 S/P EGD with esophageal stent placed  - Stent migrated w/ leak>>back to OR 10/15 for repositioning  - On TPN - Continues on fluconazole.  - Abx escalated d/t PNA, now on Vanc + Meropenum    - Plan for barium swallow study today    2. Acute on chronic HFpEF: h/o cardiogenic shock with RV failure in the setting of cardiac arrest thought to be caused by acute PE vs ACS in 3/25. Echo at the time showed EF 55%, severe, LVH, G2DD, and severely reduced RV function with strain. There was concern for cardiac sarcoid amyloid, however CMR LGE consistent with ischemic disease. RV on echo 9/25 with complete recovery, EF 60-65%, nl RV function.    - RHC 3/25: Mild PAH with severe RV failure though CO is preserved.   - Repeat echo 10/17 with EF 50-55%, RV mildly reduced  - Co-ox ok at 73% - CVP 9. Wt up 25 lb from admit wt. CXR w/ edema. High intake 2/2 TPN and IV meds. Increase IV Lasix  to 120 mg tid to aim for net negative fluid balance  - Continue TEDs   - GDMT limited by renal function  - On hydralazine  PRN   3. 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 Medical management.  - Atorvastatin   80 mg daily.    4.  AKI on CKD Stage 3b: Baseline sCr ~ 2.1. - Creatinine peaked to 2.6. SCr down to 2.2 today w/ diuresis  - continue IV Lasix  for renal venous decongestion - d/w  CCM doubt elevated BUN d/t GIB but will closely monitor. TPN likely contributing  - follow BMP     5. PAF:   - s/p TEE/DCCV 3/25 - developed AF with RVR, unsure when as tele is not available. Currently AF w/ CVR  - continue IV amio and IV heparin     - Not candidate for TEE at this point with esophageal perforation and stent placement.  Would aim for DCCV after 4 wks therapeutic anticoagulation if he remains in AF.  6.  H/o DVT/PE:  Bilateral upper and lower DVTs, mixed acute and chronic.  - s/p infrarenal IVC placement 3/25 - V/Q 3/25 with multiple perfusion defects - continue IV heparin . Will need lifelong AC, transition to Eliquis  once more stable     7. Carotid stenosis: h/o CVA. TCAR in 6/24. Okay to remain off plavix  at this point. - carotid US  9/25: widely patent carotid stent on R and no stenosis on L   8. Acute Hypoxic Respiratory Failure/PNA - intubated - on Vanc + meropenum for aspiration PNA  - CT placed for pleural effusion. Respiratory Cx w/ rare yeast, BCx NGTD  - vent management per CCM  - continue diuresis for pulmonary edema   CRITICAL CARE Performed by: Caffie Shed   Total critical care time: 15 minutes  Critical care time was exclusive of separately billable procedures and treating other patients.  Critical care was necessary to treat or prevent imminent or life-threatening deterioration.  Critical care was time spent personally by me on the following activities: development of treatment plan with patient and/or surrogate as well as nursing, discussions with consultants, evaluation of patient's response to treatment, examination of patient, obtaining history from patient or surrogate, ordering and performing treatments and interventions, ordering and review of laboratory  studies, ordering and review of radiographic studies, pulse oximetry and re-evaluation of patient's condition.  Caffie Shed, PA-C 02/07/2024   Advanced Heart Failure Team Pager [phone] (M-F; 7a - 5p)  Please contact [facility] for night-coverage after hours (5p -7a ) and weekends on [email or web address]   Patient seen with PA, I formulated the plan and agree with the above note.    Co-ox 73%, not on pressors.  He remains intubated for aspiration PNA and is on vancomycin/meropenem/fluconazole.  I/Os positive yesterday despite IV Lasix .  CVP 13 today.  BUN and creatinine stable at 2.39.    He remains in AF on amiodarone  gtt and heparin .    Hgb 8, no overt GI bleeding. Had 1 unit PRBCs yesterday.    General: NAD Neck: JVP 12-14, no thyromegaly or thyroid  nodule.  Lungs: Clear to auscultation bilaterally with normal respiratory effort. CV: Nondisplaced PMI.  Heart regular S1/S2, no S3/S4, no murmur. 1+ edema to knees.     Abdomen: Soft, nontender, no hepatosplenomegaly, no distention.  Skin: Intact without lesions or rashes.  Neurologic: Alert and oriented x 3.  Psych: Normal affect. Extremities: No clubbing or cyanosis.  HEENT: Normal.    Suspected aspiration PNA, now intubated.  Also with exudative left pleural effusion.  Candida in trach aspirate.  Antimicrobials as above.    Volume overload in setting of AKI, CVP 8-9, I/Os mildly positive yesterday despite good UOP. BUN/creatinine mildly improved.  Agree with Lasix  120 mg IV q8 hrs to keep even to mildly negative.    AF with controlled rate on amiodarone  gtt and heparin  gtt. Not candidate for TEE at this time.   CRITICAL CARE Performed by: Ezra Shuck   Total critical care time:  35 minutes  Critical care time was exclusive of separately billable procedures and treating other patients.  Critical care was necessary to treat or prevent imminent or life-threatening deterioration.  Critical care was time spent personally  by me on the following activities: development of treatment plan with patient and/or surrogate as well as nursing, discussions with consultants, evaluation of patient's response to treatment, examination of patient, obtaining history from patient or surrogate, ordering and performing treatments and interventions, ordering and review of laboratory studies, ordering and review of radiographic studies, pulse oximetry and re-evaluation of patient's condition.  Ezra Shuck 02/07/2024 2:23 PM

## 2024-02-07 NOTE — Progress Notes (Signed)
 PHARMACY - ANTICOAGULATION CONSULT NOTE  Pharmacy Consult for heparin  infusion Indication: atrial fibrillation  Allergies  Allergen Reactions   Norvasc  [Amlodipine ] Swelling and Other (See Comments)    Excessive BLE swelling Skin discoloration, blotching   Jardiance  [Empagliflozin ] Itching and Other (See Comments)    Patient mentioned penile swelling, pain   Morphine And Codeine Itching    Opioid-induced pruritus    Patient Measurements: Height: 6' 1 (185.4 cm) Weight: 84.3 kg (185 lb 13.6 oz) IBW/kg (Calculated) : 79.9 HEPARIN  DW (KG): 80.6  Vital Signs: Temp: 99.1 F (37.3 C) (10/21 2322) Temp Source: Axillary (10/21 2322) BP: 132/57 (10/22 0430) Pulse Rate: 51 (10/22 0430)  Labs: Recent Labs    02/05/24 0408 02/05/24 0800 02/05/24 1600 02/05/24 2354 02/06/24 0500 02/06/24 1640 02/07/24 0447  HGB 8.0*  --   --   --  6.6*  --  8.0*  HCT 25.3*  --   --   --  20.7*  --  24.5*  PLT 508*  --   --   --  408*  --  401*  APTT  --  65*   < >  --  118* 116* 90*  LABPROT 21.6*  --   --   --   --   --   --   INR 1.8*  --   --   --   --   --   --   HEPARINUNFRC  --  >1.10*  --   --  >1.10*  --  0.64  CREATININE 2.58*  2.58*  --    < > 2.36* 2.39*  --  2.19*   < > = values in this interval not displayed.    Estimated Creatinine Clearance: 35 mL/min (A) (by C-G formula based on SCr of 2.19 mg/dL (H)).   Assessment: 73 yo M presenting for esophageal rupture, now s/p esophageal stent placement on 10/6. PMH significant for HFrEF, Afib on Eliquis  PTA. Last dose of Eliquis  01/20/24, time unknown. Pharmacy consulted for heparin  management.  10/15 - underwent EGD with stent reposition on 10/15 after finding esophageal leak  02/07/24 AM: Heparin  level 0.64 and aPTT 90, both supratherapeutic at heparin  2050 units/hr. Noted patient last received Eliquis  on 10/19. Will monitor heparin  levels and aPTT until therapeutic and correlating. No issues with infusion running  noted. Hgb  increased to 8.0 after 1 unit pRBC yesterday, PLT stable at 401. No overt bleeding per RN.   Goal of Therapy:  Heparin  level 0.3-0.5 units/ml aPTT 66-85 seconds Monitor platelets by anticoagulation protocol: Yes   Plan:  Decrease heparin  to 1950 units/hr Monitor aPTT in 8 hours Monitor heparin  level and aPTT daily until correlating Monitor CBC and s/sx of bleeding daily  Morna Breach, PharmD PGY2 Cardiology Pharmacy Resident 02/07/2024 6:32 AM  Contact: [phone] after 3 PM
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
## 2024-02-07 NOTE — Progress Notes (Signed)
 PHARMACY - ANTICOAGULATION CONSULT NOTE  Pharmacy Consult for heparin  infusion Indication: atrial fibrillation  Allergies  Allergen Reactions   Norvasc  [Amlodipine ] Swelling and Other (See Comments)    Excessive BLE swelling Skin discoloration, blotching   Jardiance  [Empagliflozin ] Itching and Other (See Comments)    Patient mentioned penile swelling, pain   Morphine And Codeine Itching    Opioid-induced pruritus    Patient Measurements: Height: 6' 1 (185.4 cm) Weight: 84.3 kg (185 lb 13.6 oz) IBW/kg (Calculated) : 79.9 HEPARIN  DW (KG): 80.6  Vital Signs: Temp: 99 F (37.2 C) (10/22 1558) Temp Source: Axillary (10/22 1558) BP: 109/65 (10/22 2000) Pulse Rate: 58 (10/22 1930)  Labs: Recent Labs    02/05/24 0408 02/05/24 0800 02/05/24 2354 02/06/24 0500 02/06/24 1640 02/07/24 0447 02/07/24 1740  HGB 8.0*  --   --  6.6*  --  8.0*  --   HCT 25.3*  --   --  20.7*  --  24.5*  --   PLT 508*  --   --  408*  --  401*  --   APTT  --    < >  --  118* 116* 90* 97*  LABPROT 21.6*  --   --   --   --   --   --   INR 1.8*  --   --   --   --   --   --   HEPARINUNFRC  --    < >  --  >1.10*  --  0.64 0.43  CREATININE 2.58*  2.58*   < > 2.36* 2.39*  --  2.19*  --    < > = values in this interval not displayed.    Estimated Creatinine Clearance: 35 mL/min (A) (by C-G formula based on SCr of 2.19 mg/dL (H)).   Assessment: 73 yo M presenting for esophageal rupture, now s/p esophageal stent placement on 10/6. PMH significant for HFrEF, Afib on Eliquis  PTA. Last dose of Eliquis  01/20/24, time unknown. Pharmacy consulted for heparin  management.  10/15 - underwent EGD with stent reposition on 10/15 after finding esophageal leak  Noted patient last received Eliquis  on 10/19. Will monitor heparin  levels and aPTT until therapeutic and correlating. No issues with infusion running  noted. Hgb increased to 8.0 after 1 unit pRBC yesterday, PLT stable at 401. No overt bleeding per RN.    Heparin  level this evening is 0.43, aPTT 97 seconds.  Goal of Therapy:  Heparin  level 0.3-0.5 units/ml aPTT 66-85 seconds Monitor platelets by anticoagulation protocol: Yes   Plan:  Continue IV heparin  at 1950 units/hr Monitor heparin  level and aPTT daily until correlating Monitor CBC and s/sx of bleeding daily  Harlene Barlow, [employer], [facility] Clinical Pharmacist  02/07/2024 8:41 PM   [facility] pharmacy phone numbers are listed on [email or web address]
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
## 2024-02-08 ENCOUNTER — Inpatient Hospital Stay (HOSPITAL_COMMUNITY)

## 2024-02-08 DIAGNOSIS — I2489 Other forms of acute ischemic heart disease: Secondary | ICD-10-CM | POA: Diagnosis not present

## 2024-02-08 DIAGNOSIS — K223 Perforation of esophagus: Secondary | ICD-10-CM | POA: Diagnosis not present

## 2024-02-08 DIAGNOSIS — J90 Pleural effusion, not elsewhere classified: Secondary | ICD-10-CM | POA: Diagnosis not present

## 2024-02-08 DIAGNOSIS — Z4682 Encounter for fitting and adjustment of non-vascular catheter: Secondary | ICD-10-CM | POA: Diagnosis not present

## 2024-02-08 DIAGNOSIS — R918 Other nonspecific abnormal finding of lung field: Secondary | ICD-10-CM | POA: Diagnosis not present

## 2024-02-08 DIAGNOSIS — R0902 Hypoxemia: Secondary | ICD-10-CM | POA: Diagnosis not present

## 2024-02-08 DIAGNOSIS — E43 Unspecified severe protein-calorie malnutrition: Secondary | ICD-10-CM | POA: Insufficient documentation

## 2024-02-08 LAB — PHOSPHORUS: Phosphorus: 4.2 mg/dL (ref 2.5–4.6)

## 2024-02-08 LAB — COMPREHENSIVE METABOLIC PANEL WITH GFR
ALT: 40 U/L (ref 0–44)
AST: 33 U/L (ref 15–41)
Albumin: <1.5 g/dL — ABNORMAL LOW (ref 3.5–5.0)
Alkaline Phosphatase: 108 U/L (ref 38–126)
Anion gap: 12 (ref 5–15)
BUN: 123 mg/dL — ABNORMAL HIGH (ref 8–23)
CO2: 22 mmol/L (ref 22–32)
Calcium: 8 mg/dL — ABNORMAL LOW (ref 8.9–10.3)
Chloride: 106 mmol/L (ref 98–111)
Creatinine, Ser: 1.91 mg/dL — ABNORMAL HIGH (ref 0.61–1.24)
GFR, Estimated: 37 mL/min — ABNORMAL LOW (ref >60)
Glucose, Bld: 202 mg/dL — ABNORMAL HIGH (ref 70–99)
Potassium: 4.1 mmol/L (ref 3.5–5.1)
Sodium: 140 mmol/L (ref 135–145)
Total Bilirubin: 0.7 mg/dL (ref 0.0–1.2)
Total Protein: 5.6 g/dL — ABNORMAL LOW (ref 6.5–8.1)

## 2024-02-08 LAB — HEPARIN LEVEL (UNFRACTIONATED)
Heparin Unfractionated: 0.54 [IU]/mL (ref 0.30–0.70)
Heparin Unfractionated: 0.33 [IU]/mL (ref 0.30–0.70)

## 2024-02-08 LAB — ECHOCARDIOGRAM LIMITED
AR max vel: 2.3 cm2
AV Area VTI: 2.29 cm2
AV Area mean vel: 2.5 cm2
AV Mean grad: 9 mmHg
AV Peak grad: 16.3 mmHg
Ao pk vel: 2.02 m/s
Area-P 1/2: 2.81 cm2
Height: 73 in
MV VTI: 2.45 cm2
S' Lateral: 2.4 cm
Weight: 2917.13 [oz_av]

## 2024-02-08 LAB — RENAL FUNCTION PANEL
Albumin: <1.5 g/dL — ABNORMAL LOW (ref 3.5–5.0)
Anion gap: 13 (ref 5–15)
BUN: 123 mg/dL — ABNORMAL HIGH (ref 8–23)
CO2: 23 mmol/L (ref 22–32)
Calcium: 8.1 mg/dL — ABNORMAL LOW (ref 8.9–10.3)
Chloride: 105 mmol/L (ref 98–111)
Creatinine, Ser: 1.88 mg/dL — ABNORMAL HIGH (ref 0.61–1.24)
GFR, Estimated: 38 mL/min — ABNORMAL LOW (ref >60)
Glucose, Bld: 205 mg/dL — ABNORMAL HIGH (ref 70–99)
Phosphorus: 4.2 mg/dL (ref 2.5–4.6)
Potassium: 4.1 mmol/L (ref 3.5–5.1)
Sodium: 141 mmol/L (ref 135–145)

## 2024-02-08 LAB — CBC
HCT: 26.8 % — ABNORMAL LOW (ref 39.0–52.0)
Hemoglobin: 8.5 g/dL — ABNORMAL LOW (ref 13.0–17.0)
MCH: 28.1 pg (ref 26.0–34.0)
MCHC: 31.7 g/dL (ref 30.0–36.0)
MCV: 88.4 fL (ref 80.0–100.0)
Platelets: 458 K/uL — ABNORMAL HIGH (ref 150–400)
RBC: 3.03 MIL/uL — ABNORMAL LOW (ref 4.22–5.81)
RDW: 18.3 % — ABNORMAL HIGH (ref 11.5–15.5)
WBC: 13.5 K/uL — ABNORMAL HIGH (ref 4.0–10.5)
nRBC: 0 % (ref 0.0–0.2)

## 2024-02-08 LAB — GLUCOSE, CAPILLARY
Glucose-Capillary: 205 mg/dL — ABNORMAL HIGH (ref 70–99)
Glucose-Capillary: 135 mg/dL — ABNORMAL HIGH (ref 70–99)
Glucose-Capillary: 103 mg/dL — ABNORMAL HIGH (ref 70–99)
Glucose-Capillary: 153 mg/dL — ABNORMAL HIGH (ref 70–99)

## 2024-02-08 LAB — CHOLESTEROL, BODY FLUID: Cholesterol, Fluid: 35 mg/dL

## 2024-02-08 LAB — COOXEMETRY PANEL
Carboxyhemoglobin: 1.8 % — ABNORMAL HIGH (ref 0.5–1.5)
Methemoglobin: <0.7 % (ref 0.0–1.5)
O2 Saturation: 81.8 %
Total hemoglobin: 7.8 g/dL — ABNORMAL LOW (ref 12.0–16.0)

## 2024-02-08 LAB — TRIGLYCERIDES
Triglycerides: 472 mg/dL — ABNORMAL HIGH (ref <150)
Triglycerides: 49 mg/dL (ref <150)

## 2024-02-08 LAB — BODY FLUID CULTURE W GRAM STAIN
Culture: NO GROWTH
Gram Stain: NONE SEEN

## 2024-02-08 LAB — MAGNESIUM: Magnesium: 2.2 mg/dL (ref 1.7–2.4)

## 2024-02-08 LAB — APTT: aPTT: 101 s — ABNORMAL HIGH (ref 24–36)

## 2024-02-08 MED ORDER — SODIUM CHLORIDE 3 % IN NEBU
4.0000 mL | INHALATION_SOLUTION | Freq: Two times a day (BID) | RESPIRATORY_TRACT | Status: AC
  Administered 2024-02-08 – 2024-02-10 (×6): 4 mL via RESPIRATORY_TRACT
  Filled 2024-02-08 (×5): qty 4

## 2024-02-08 MED ORDER — INSULIN ASPART 100 UNIT/ML IJ SOLN
0.0000 [IU] | INTRAMUSCULAR | Status: DC
  Administered 2024-02-08: 2 [IU] via SUBCUTANEOUS

## 2024-02-08 MED ORDER — DEXMEDETOMIDINE HCL IN NACL 400 MCG/100ML IV SOLN
INTRAVENOUS | Status: AC
  Administered 2024-02-08: 0.2 ug/kg/h via INTRAVENOUS
  Filled 2024-02-08: qty 100

## 2024-02-08 MED ORDER — FUROSEMIDE 10 MG/ML IJ SOLN
160.0000 mg | Freq: Three times a day (TID) | INTRAVENOUS | Status: AC
Start: 2024-02-08 — End: 2024-02-09
  Administered 2024-02-08 (×2): 160 mg via INTRAVENOUS
  Filled 2024-02-08 (×2): qty 10

## 2024-02-08 MED ORDER — ORAL CARE MOUTH RINSE
15.0000 mL | Freq: Three times a day (TID) | OROMUCOSAL | Status: DC
  Administered 2024-02-09 – 2024-02-11 (×7): 15 mL via OROMUCOSAL

## 2024-02-08 MED ORDER — TRACE MINERALS CU-MN-SE-ZN 300-55-60-3000 MCG/ML IV SOLN
INTRAVENOUS | Status: AC
  Filled 2024-02-08: qty 772.8

## 2024-02-08 MED ORDER — NOREPINEPHRINE 4 MG/250ML-% IV SOLN
0.0000 ug/min | INTRAVENOUS | Status: DC
  Administered 2024-02-08: 5 ug/min via INTRAVENOUS
  Filled 2024-02-08: qty 250

## 2024-02-08 MED ORDER — DEXMEDETOMIDINE HCL IN NACL 400 MCG/100ML IV SOLN
0.0000 ug/kg/h | INTRAVENOUS | Status: DC
  Administered 2024-02-08 – 2024-02-10 (×2): 0.2 ug/kg/h via INTRAVENOUS
  Administered 2024-02-10 – 2024-02-12 (×3): 0.3 ug/kg/h via INTRAVENOUS
  Filled 2024-02-08 (×4): qty 100

## 2024-02-08 MED ORDER — FUROSEMIDE 10 MG/ML IJ SOLN
160.0000 mg | Freq: Two times a day (BID) | INTRAVENOUS | Status: DC
  Administered 2024-02-08: 160 mg via INTRAVENOUS
  Filled 2024-02-08 (×2): qty 16

## 2024-02-08 NOTE — Progress Notes (Addendum)
 eLink Physician-Brief Progress Note Patient Name: Billy [name] DOB: 1950-06-30 MRN: [id number]   Date of Service  02/08/2024  HPI/Events of Note  Difficulty maintaining RASS goals without escalating propofol .  Unfortunately, he starts to become somewhat hypotensive with escalation  eICU Interventions  Add norepinephrine  as needed through the PICC   0105 -having increasing peak pressures up to the mid 40s.  Waveforms and consistent with dynamic blockage, but given that this is an increase from peak of the high teens during the daytime, we will reevaluate with a chest radiograph, reduced to 6 cc/kg, add hypertonic nebs  0313 -still having peak pressure alarms, adequately sedated, some difficulty passing inline suctioning but little to no aspirated contents.  Attempted removing HME filter with no direct benefit.  Will request change if tubes/filters to reassess  Intervention Category Intermediate Interventions: Hypotension - evaluation and management  Cieanna Stormes 02/08/2024, 12:20 AM

## 2024-02-08 NOTE — Progress Notes (Signed)
 PHARMACY - ANTICOAGULATION CONSULT NOTE  Pharmacy Consult for heparin  infusion Indication: atrial fibrillation  Allergies  Allergen Reactions   Norvasc  [Amlodipine ] Swelling and Other (See Comments)    Excessive BLE swelling Skin discoloration, blotching   Jardiance  [Empagliflozin ] Itching and Other (See Comments)    Patient mentioned penile swelling, pain   Morphine And Codeine Itching    Opioid-induced pruritus    Patient Measurements: Height: 6' 1 (185.4 cm) Weight: 82.7 kg (182 lb 5.1 oz) IBW/kg (Calculated) : 79.9 HEPARIN  DW (KG): 80.6  Vital Signs: Temp: 99.3 F (37.4 C) (10/23 1625) Temp Source: Oral (10/23 1625) BP: 106/44 (10/23 1600) Pulse Rate: 64 (10/23 1600)  Labs: Recent Labs    02/06/24 0500 02/06/24 1640 02/07/24 0447 02/07/24 1740 02/08/24 0457 02/08/24 0549 02/08/24 1649  HGB 6.6*  --  8.0*  --  8.5*  --   --   HCT 20.7*  --  24.5*  --  26.8*  --   --   PLT 408*  --  401*  --  458*  --   --   APTT 118*   < > 90* 97*  --  101*  --   HEPARINUNFRC >1.10*  --  0.64 0.43  --  0.54 0.33  CREATININE 2.39*  --  2.19*  --  1.91*  1.88*  --   --    < > = values in this interval not displayed.    Estimated Creatinine Clearance: 40.1 mL/min (A) (by C-G formula based on SCr of 1.91 mg/dL (H)).   Assessment: 73 yo M presenting for esophageal rupture, now s/p esophageal stent placement on 10/6. PMH significant for HFrEF, Afib on Eliquis  PTA. Last dose of Eliquis  01/20/24, time unknown. Pharmacy consulted for heparin  management.  10/15 - underwent EGD with stent reposition on 10/15 after finding esophageal leak  02/08/24 AM: Heparin  level 0.54 and aPTT 101, both supra-therapeutic on heparin  1950 units/hr. Anti-Xa level and aPTT now correlating, will utilize anti-Xa levels for dosing. No issues with infusion running or signs of bleeding per RN. CBC stable (Hgb 8.5, PLT 458).   02/07/22 PM: Heparin  level 0.33 is therapeutic on 1800 units/hr. No issues with  the infusion or bleeding reported per RN.  Goal of Therapy:  Heparin  level 0.3-0.5 units/ml aPTT 66-85 seconds Monitor platelets by anticoagulation protocol: Yes   Plan:  Continue IV heparin  at 1800 units/hr Check confirmatory heparin  level in ~8hrs Monitor heparin  level daily Monitor CBC and s/sx of bleeding daily  Rocky Slade, PharmD, BCPS 02/08/2024 5:34 PM
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
## 2024-02-08 NOTE — Progress Notes (Signed)
 PHARMACY - TOTAL PARENTERAL NUTRITION CONSULT NOTE   Indication: intolerance to enteral feedings, esophageal perforation  Patient Measurements: Height: 6' 1 (185.4 cm) Weight: 82.7 kg (182 lb 5.1 oz) IBW/kg (Calculated) : 79.9 TPN AdjBW (KG): 80.6 Body mass index is 24.05 kg/m.  Assessment:  73 year old man with medical history significant for cardiac arrest earlier this year who was admitted after several days of nausea and vomiting followed by sudden onset severe tearing epigastric pain radiating to back and chest. 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 Pharmacy consulted to initiate TPN due to inability to tolerate enteral nutrition at this time.  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 PTA takes torsemide  20 mg PO daily, now held. Aggressive fluid resuscitation recommended per GI. Per HF, patient currently euvolemic but will hold diuretic for now. Due to CHF will target lower end of fluid range for TPN and supplement additional fluids per MD outside of TPN to prevent fluid overload.   Stent placed on 10/6, esophogram not showing any leak. Diet advanced to CLD. 10/15 Leak present and made NPO.  Glucose / Insulin : A1C 6.0% (02/2023);  BG 99 - 202, 18 units insulin  in TPN, no SSI Electrolytes:  K 4.1 (Received 80 mEq IV); CoCa 10 (none in TPN), Mg 2.2 (none in TPN), phos, others wnl Renal: Scr 1.88 down (bsl ~2), BUN 123   Hepatic: AST/ALT/tbili wnl, Alk Phos 189, alb <1.5, TG 32>472 with propofol  and TPN with lipid running  Intake / Output; MIVF: furosemide  in D5W 120mg  x3; UOP 1.8 mL/kg/hr, chest tube 160 ml, LBM 10/22 Net IO Since Admission: 18,286.45 mL [02/08/24 0801]  - weight up 25 lbs since admit  GI Imaging: 10/05 CT: concerning for esophageal perforation, masslike area of soft tissue thickening adjacent to and surrounding the esophagus  10/06 Esoph XR: distal esophageal perforation 10/7 Esoph XR: No leak associated with  stent coverage of esophageal perforation 10/14 KUB: Gas-filled loops of small bowel and colon suggestive of ileus 10/15 Esoph XR: esophageal perforation/leak at the distal esophagus, proximal to the esophageal stent GI Surgeries / Procedures:  10/6: esophageal stent placement  10/15 EGD and stent repositioning   Central access: 01/22/24 TPN start date: 01/22/24  Nutritional Goals:  Goal concentrated TPN is 70 ml/hr (provides 116 g of protein and 2301 kcals per day  RD Assessment:  Estimated Needs Total Energy Estimated Needs: 2300-2500 kcals Total Protein Estimated Needs: 115-130 g Total Fluid Estimated Needs: 2L  Current Nutrition:  TPN 10/13- 10/14 DYS1: not taking any po  10/15: NPO d/t increased abd distension  10/22 Propofol  at 7.6- 20.2 ml/hr, providing ~370 kcals/24hr 10/23: switch propofol  back to dexmedetomidine   Plan:  Continue concentrated TPN at goal 70 mL/hr, providing 100% estimated nutritional needs   Electrolytes in TPN: Na 50 mEq/L, increase K 60 mEq/L, Ca 0 mEq/L, Mg 0 mEq/L, decrease Phos 3 mmol/L. Cl:Ac 1:2 Continue standard MVI and trace elements to TPN Add 16 units insulin  regular to TPN , check BG q8hr Monitor TPN labs on Mon/Thurs, and PRN    Thank you for involving pharmacy in this patient's care.  Jinnie Door, PharmD, BCPS, BCCP Clinical Pharmacist  Please check AMION for all [facility] phone numbers After 10:00 PM, call Main Pharmacy [phone]

## 2024-02-08 NOTE — Plan of Care (Signed)
  Problem: Fluid Volume: Goal: Ability to maintain a balanced intake and output will improve Outcome: Progressing   Problem: Metabolic: Goal: Ability to maintain appropriate glucose levels will improve Outcome: Progressing   

## 2024-02-08 NOTE — Progress Notes (Signed)
 PHARMACY - ANTICOAGULATION CONSULT NOTE  Pharmacy Consult for heparin  infusion Indication: atrial fibrillation  Allergies  Allergen Reactions   Norvasc  [Amlodipine ] Swelling and Other (See Comments)    Excessive BLE swelling Skin discoloration, blotching   Jardiance  [Empagliflozin ] Itching and Other (See Comments)    Patient mentioned penile swelling, pain   Morphine And Codeine Itching    Opioid-induced pruritus    Patient Measurements: Height: 6' 1 (185.4 cm) Weight: 82.7 kg (182 lb 5.1 oz) IBW/kg (Calculated) : 79.9 HEPARIN  DW (KG): 80.6  Vital Signs: Temp: 98.7 F (37.1 C) (10/23 0808) Temp Source: Axillary (10/23 0808) BP: 132/116 (10/23 0745) Pulse Rate: 61 (10/23 0745)  Labs: Recent Labs    02/06/24 0500 02/06/24 1640 02/07/24 0447 02/07/24 1740 02/08/24 0457 02/08/24 0549  HGB 6.6*  --  8.0*  --  8.5*  --   HCT 20.7*  --  24.5*  --  26.8*  --   PLT 408*  --  401*  --  458*  --   APTT 118*   < > 90* 97*  --  101*  HEPARINUNFRC >1.10*  --  0.64 0.43  --  0.54  CREATININE 2.39*  --  2.19*  --  1.91*  1.88*  --    < > = values in this interval not displayed.    Estimated Creatinine Clearance: 40.1 mL/min (A) (by C-G formula based on SCr of 1.91 mg/dL (H)).   Assessment: 73 yo M presenting for esophageal rupture, now s/p esophageal stent placement on 10/6. PMH significant for HFrEF, Afib on Eliquis  PTA. Last dose of Eliquis  01/20/24, time unknown. Pharmacy consulted for heparin  management.  10/15 - underwent EGD with stent reposition on 10/15 after finding esophageal leak  02/08/24 AM: Heparin  level 0.54 and aPTT 101, both supra-therapeutic on heparin  1950 units/hr. Anti-Xa level and aPTT now correlating, will utilize anti-Xa levels for dosing. No issues with infusion running or signs of bleeding per RN. CBC stable (Hgb 8.5, PLT 458).   Goal of Therapy:  Heparin  level 0.3-0.5 units/ml aPTT 66-85 seconds Monitor platelets by anticoagulation protocol:  Yes   Plan:  Decrease IV heparin  to 1800 units/hr Monitor heparin  level and aPTT daily until correlating Monitor CBC and s/sx of bleeding daily  Morna Breach, PharmD PGY2 Cardiology Pharmacy Resident 02/08/2024 8:50 AM

## 2024-02-08 NOTE — Procedures (Signed)
 Extubation Procedure Note  Patient Details:   Name: Billy [name] DOB: Dec 12, 1950 MRN: [id number]   Airway Documentation:    Vent end date: 02/08/24 Vent end time: 1450   Evaluation  O2 sats: stable throughout Complications: No apparent complications Patient did tolerate procedure well. Bilateral Breath Sounds: Clear, Diminished   Yes  RT extubated patient to [facility] per MD order with RN at bedside. Positive cuff leak noted. Patient RR about 30 but patient is not in distress at this time. No stridor noted at this time. RT will continue to monitor as needed.   Paulla ONEIDA Gaskins 02/08/2024, 2:57 PM

## 2024-02-08 NOTE — Progress Notes (Cosign Needed)
 Patient ID: Billy [name], male   DOB: 1950-12-11, 73 y.o.   MRN: [id number]      Advanced Heart Failure Rounding Note  Cardiologist: Dorn Lesches, MD   Chief Complaint: S/P esophageal stent  Subjective:    - S/p EGD with stent repositioning by Dr. Shyrl 10/15.  - Limited echo 10/17 with EF 50-55%, RV mildly reduced  Remains intubated, awake on vent and follows commands.  Continues on Vanc+ mero + fluconazole for aspiration PNA Respiratory Cx +rare yeast BCx NGTD    Co-ox 82%  3.5L in UOP yesterday but still net + for the day.   SCr 2.6>>2.4>>2.66>>2.58>2.39>>2.19>>1.88. BUN 124>>133>>128>>123   K 4.1   Back in NSR.   Remains intubated, awake on vent and will follow compands.     Objective:    Weight Range: 82.7 kg Body mass index is 24.05 kg/m.   Vital Signs:   Temp:  [98.7 F (37.1 C)-99 F (37.2 C)] 98.7 F (37.1 C) (10/23 0808) Pulse Rate:  [48-75] 75 (10/23 0900) Resp:  [13-25] 22 (10/23 0900) BP: (83-160)/(38-135) 141/59 (10/23 0900) SpO2:  [96 %-100 %] 98 % (10/23 0900) FiO2 (%):  [40 %] 40 % (10/23 0817) Weight:  [82.7 kg] 82.7 kg (10/23 0500) Last BM Date : 02/07/24  Weight change: Filed Weights   02/05/24 0500 02/07/24 0500 02/08/24 0500  Weight: 83.2 kg 84.3 kg 82.7 kg   Intake/Output:  Intake/Output Summary (Last 24 hours) at 02/08/2024 0930 Last data filed at 02/08/2024 0900 Gross per 24 hour  Intake 4237.29 ml  Output 3225 ml  Net 1012.29 ml    Physical Exam   GENERAL: intubated, awake on vent EENT: + ETT  Lungs- intubated, diminished at bases  CARDIAC:  JVP: 13 cm         Normal rate with regular rhythm. 1+ b/l pretibial edema  ABDOMEN: mildly distended, hypoactive BS  EXTREMITIES: 1+ pretibial edema  NEUROLOGIC: intubated, awake on vent, follows commands  Telemetry   NSR 70s, personally reviewed   Labs    CBC Recent Labs    02/07/24 0447 02/08/24 0457  WBC 9.7 13.5*  HGB 8.0* 8.5*  HCT 24.5* 26.8*  MCV 86.3  88.4  PLT 401* 458*   Basic Metabolic Panel Recent Labs    89/78/74 0500 02/07/24 0447 02/08/24 0457  NA 143 144 140  141  K 3.4* 3.5 4.1  4.1  CL 109 108 106  105  CO2 23 23 22  23   GLUCOSE 163* 182* 202*  205*  BUN 133* 128* 123*  123*  CREATININE 2.39* 2.19* 1.91*  1.88*  CALCIUM  8.1* 8.3* 8.0*  8.1*  MG 2.7*  --  2.2  PHOS 3.8 3.2 4.2  4.2   Liver Function Tests Recent Labs    02/07/24 0447 02/08/24 0457  AST  --  33  ALT  --  40  ALKPHOS  --  108  BILITOT  --  0.7  PROT  --  5.6*  ALBUMIN  <1.5* <1.5*  <1.5*   BNP (last 3 results) Recent Labs    03/09/23 1544 03/11/23 0218 06/27/23 1116  BNP 660.9* 491.3* 646.4*   Hemoglobin A1C No results for input(s): HGBA1C in the last 72 hours.  Fasting Lipid Panel Recent Labs    02/08/24 0457  TRIG 472*    Medications:    Scheduled Medications:  bisacodyl   10 mg Rectal Q0600   Chlorhexidine  Gluconate Cloth  6 each Topical Daily   ipratropium-albuterol   3 mL  Nebulization Q6H   lidocaine   1 patch Transdermal Q24H   mouth rinse  15 mL Mouth Rinse Q2H   pantoprazole  (PROTONIX ) IV  40 mg Intravenous Q12H   sodium chloride  flush  10 mL Intrapleural Q8H   sodium chloride  flush  10-40 mL Intracatheter Q12H   sodium chloride  HYPERTONIC  4 mL Nebulization BID    Infusions:  amiodarone  30 mg/hr (02/08/24 0900)   dexmedetomidine  (PRECEDEX ) IV infusion 0.2 mcg/kg/hr (02/08/24 0907)   fentaNYL  infusion INTRAVENOUS Stopped (02/08/24 0741)   fluconazole (DIFLUCAN) IV Stopped (02/07/24 1149)   heparin  1,800 Units/hr (02/08/24 0909)   meropenem (MERREM) IV Stopped (02/07/24 2206)   norepinephrine  (LEVOPHED ) Adult infusion Stopped (02/08/24 0745)   propofol  (DIPRIVAN ) infusion Stopped (02/08/24 0741)   TPN ADULT (ION) 70 mL/hr at 02/08/24 0701   TPN ADULT (ION)     vancomycin Stopped (02/08/24 0845)    PRN Medications: fentaNYL , hydrALAZINE , HYDROmorphone  (DILAUDID ) injection, levalbuterol, LORazepam ,  ondansetron  (ZOFRAN ) IV, mouth rinse, sodium chloride  flush  Patient Profile    Patient with longstanding history of chronic heart failure with preserved ejection fraction, RV failure with history of pulmonary embolism presents with esophageal perforation.  S/P Stent Esophageal Stent. Stent repositioned 10/15.  Assessment/Plan   1.  Esophageal Perforation - Boerhaave syndrome.  - CT C/A/P: with pneumomediastinum and large mass-like density in lower esophagus - Prior EGD 3/25: erosive gastritis, duodenal deformity, and bleeding friable duodenal mucosa s/p hemostatic - 01/23/24 S/P EGD with esophageal stent placed  - Stent migrated w/ leak>>back to OR 10/15 for repositioning  - On TPN - Continues on fluconazole.  - Abx escalated d/t PNA, now on Vanc + Meropenum      2. Acute on chronic HFpEF: h/o cardiogenic shock with RV failure in the setting of cardiac arrest thought to be caused by acute PE vs ACS in 3/25. Echo at the time showed EF 55%, severe, LVH, G2DD, and severely reduced RV function with strain. There was concern for cardiac sarcoid amyloid, however CMR LGE consistent with ischemic disease. RV on echo 9/25 with complete recovery, EF 60-65%, nl RV function.    - RHC 3/25: Mild PAH with severe RV failure though CO is preserved.   - Repeat echo 10/17 with EF 50-55%, RV mildly reduced  - Co-ox ok at 82% - CVP 12-13, volume up on exam. Increase IV Lasix  to 160 mg tid  - Continue TEDs   - GDMT limited by renal function  - On hydralazine  PRN   3. 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 Medical management.  - Atorvastatin  80 mg daily.    4.  AKI on CKD Stage 3b: Baseline sCr ~ 2.1. - Creatinine peaked to 2.6. SCr down to 2.2 today w/ diuresis  - continue IV Lasix  for renal venous decongestion - d/w CCM doubt elevated BUN d/t GIB but will closely monitor. TPN likely contributing  - follow BMP     5. PAF:   -  s/p TEE/DCCV 3/25 - developed AF with RVR, unsure when as tele is not available.  - back in NSR this morning   - continue IV amio and IV heparin       6.  H/o DVT/PE:  Bilateral upper and lower DVTs, mixed acute and chronic.  - s/p infrarenal IVC placement 3/25 - V/Q 3/25 with multiple perfusion defects - continue IV heparin . Will need lifelong AC, transition to Eliquis  once more stable  7. Carotid stenosis: h/o CVA. TCAR in 6/24. Okay to remain off plavix  at this point. - carotid US  9/25: widely patent carotid stent on R and no stenosis on L   8. Acute Hypoxic Respiratory Failure/PNA/ Exudative Lt Pleural Effusion  - intubated - on Vanc + meropenum for aspiration PNA  - CT placed for pleural effusion. Respiratory Cx w/ rare yeast, BCx NGTD  - vent management per CCM  - continue diuresis for pulmonary edema   CRITICAL CARE Performed by: Caffie Shed   Total critical care time: 15 minutes  Critical care time was exclusive of separately billable procedures and treating other patients.  Critical care was necessary to treat or prevent imminent or life-threatening deterioration.  Critical care was time spent personally by me on the following activities: development of treatment plan with patient and/or surrogate as well as nursing, discussions with consultants, evaluation of patient's response to treatment, examination of patient, obtaining history from patient or surrogate, ordering and performing treatments and interventions, ordering and review of laboratory studies, ordering and review of radiographic studies, pulse oximetry and re-evaluation of patient's condition.  Caffie Shed, PA-C  02/08/2024  Agree with above.   Awake on vent. Follows command. Co-ox 82%. Back in NSR on IV amio.  CVP low. Modest diuresis.  Remains on TPN  On IV heparin  No obvious bleeding  General:  Awake on vent  HEENT: normal + ETT Neck: supple. no JVD. Cor: PMI nondisplaced. Regular  rate & rhythm. No rubs, gallops or murmurs. Lungs: clear Abdomen: soft, nontender, + distended. Hypoactive bowel sounds. Extremities: no cyanosis, clubbing, rash, 1-2+ edema Neuro: follows commands  Back in NSR on IV amio. Tolerating heparin .   Still about 15 pound up from pre-op but mostly 3rd spacing. Increase IV lasix  to 160IV bid. Continue compression.   Hopefully can extubate later today. D/w CCM  CRITICAL CARE Performed by: Cherrie Sieving  Total critical care time: 41 minutes  Critical care time was exclusive of separately billable procedures and treating other patients.  Critical care was necessary to treat or prevent imminent or life-threatening deterioration.  Critical care was time spent personally by me (independent of midlevel providers or residents) on the following activities: development of treatment plan with patient and/or surrogate as well as nursing, discussions with consultants, evaluation of patient's response to treatment, examination of patient, obtaining history from patient or surrogate, ordering and performing treatments and interventions, ordering and review of laboratory studies, ordering and review of radiographic studies, pulse oximetry and re-evaluation of patient's condition.  Sieving Cherrie, MD  8:22 AM

## 2024-02-08 NOTE — Progress Notes (Signed)
 Nutrition Follow-up  DOCUMENTATION CODES:   Severe malnutrition in context of acute illness/injury  INTERVENTION:   TPN to meet nutritional needs -Noted TPN has been adjusted to meet increased estimated calorie needs  NUTRITION DIAGNOSIS:   Severe Malnutrition related to acute illness (may have chronic component due to time frame of reported wt loss) as evidenced by severe fat depletion, moderate muscle depletion.  Being addressed via TPN  GOAL:   Patient will meet greater than or equal to 90% of their needs  Progressing  MONITOR:   Vent status, Labs, Weight trends, I & O's, Skin (TPN)  REASON FOR ASSESSMENT:   Consult, Rounds New TPN/TNA  ASSESSMENT:   73 yo male admitted post several days of N/V followed by sudden onset of severe epigastric pain radiating to chest and back. Pt found to have small esophageal perforation above GE junction. PMH includes CAD, chronic HFpEF, HTN, HLD, DVT, CVA, PAF, CKD 3b.  10/05 Admitted 10/06 Esophagogram/barium study: distal esophageal perforation, TPN started 10/07 S/p Esophageal stent, TPN at goal rate of 80 ml/hr 10/08  CLD 10/10 Diet advanced to Dysphagia 3, Calorie Count 10/13 No meal tickets for calorie count, pt reports eating bites only 10/15 Esophageal stent migration with leak on esophagram with return to OR for stent repositioning, likely aspiration event requiring intubation 10/16 Extubated, Chest tube placed 10/17 Limited echo with EF 50-55%, RV mildly reduced 10/20 Intubated early AM, L pleural effusion, pigtail chest tube inserted  Pt awake on vent, noted plan for possible extubation today. Once extubated, if pt requires reintubation, plan for possible trach  Chest xray this AM with small bilateral pleural effusions, possible worsening RML pneumonia  BUN 123, Creatinine 1.9. BUN has been in the 102s since 10/19. Noted BUN started worsening Serum Sodium 140-141 but serum glucose 202-205. Corrected sodium  142-143  TPN at 70 ml/hr providing 116 g of protein, 2301 kcals and 1680 mL of fluid.   NFPE with worsening wasting on exam; pt now meets criteria for severe malnutrition.  Noted several stage I pressure injuries documented Albumin  <1.5;  pitting edema on exam, likely 3rd spacing  Noted lasix  dose increased today UOP 3.5 L in 24 hours, 1100 mL thus far today. NEt + however over the last 24 hours Chest tube with 160 mL  Do not recommend decreasing protein in TPN at this time; catabolic state with muscle breakdown (noted on physical exam)  TG 472 this AM, repeat 49. Questions accuracy of AM labs. If drawn from central line, all of AM labs may be contaminated with TPN if line not flushed well  Labs:  BUN 123 (H) Creatinine 1.9 Potassium 4.1 (wdl) CBGs 99-205 (goal 140-180) Corrected Calcium  10.1 Albumin  <1.5  Meds: Lasix  IV KCl  NUTRITION - FOCUSED PHYSICAL EXAM:  Flowsheet Row Most Recent Value  Orbital Region Severe depletion  Upper Arm Region Unable to assess  Thoracic and Lumbar Region Severe depletion  Buccal Region Severe depletion  Temple Region Severe depletion  Clavicle Bone Region Moderate depletion  Clavicle and Acromion Bone Region Moderate depletion  Scapular Bone Region Moderate depletion  Dorsal Hand Unable to assess  Patellar Region Unable to assess  Anterior Thigh Region Unable to assess  Posterior Calf Region Unable to assess  Edema (RD Assessment) Moderate    Diet Order:   Diet Order             Diet NPO time specified  Diet effective now  EDUCATION NEEDS:   Education needs have been addressed  Skin:  Skin Assessment: Skin Integrity Issues: Skin Integrity Issues:: Stage I Stage I: buttocks, bilateral feet  Last BM:  10/22  Height:   Ht Readings from Last 1 Encounters:  02/07/24 6' 1 (1.854 m)    Weight:   Wt Readings from Last 1 Encounters:  02/08/24 82.7 kg    BMI:  Body mass index is 24.05  kg/m.  Estimated Nutritional Needs:   Kcal:  2300-2500 kcals  Protein:  115-130 g  Fluid:  2L   Betsey Finger MS, RDN, LDN, CNSC Registered Dietitian 3 Clinical Nutrition RD Inpatient Contact Info in Amion

## 2024-02-09 ENCOUNTER — Inpatient Hospital Stay (HOSPITAL_COMMUNITY)

## 2024-02-09 DIAGNOSIS — Z4682 Encounter for fitting and adjustment of non-vascular catheter: Secondary | ICD-10-CM | POA: Diagnosis not present

## 2024-02-09 DIAGNOSIS — Z452 Encounter for adjustment and management of vascular access device: Secondary | ICD-10-CM | POA: Diagnosis not present

## 2024-02-09 DIAGNOSIS — K223 Perforation of esophagus: Secondary | ICD-10-CM | POA: Diagnosis not present

## 2024-02-09 DIAGNOSIS — R918 Other nonspecific abnormal finding of lung field: Secondary | ICD-10-CM | POA: Diagnosis not present

## 2024-02-09 DIAGNOSIS — J90 Pleural effusion, not elsewhere classified: Secondary | ICD-10-CM | POA: Diagnosis not present

## 2024-02-09 LAB — RENAL FUNCTION PANEL
Albumin: <1.5 g/dL — ABNORMAL LOW (ref 3.5–5.0)
Anion gap: 12 (ref 5–15)
BUN: 120 mg/dL — ABNORMAL HIGH (ref 8–23)
CO2: 26 mmol/L (ref 22–32)
Calcium: 8.7 mg/dL — ABNORMAL LOW (ref 8.9–10.3)
Chloride: 109 mmol/L (ref 98–111)
Creatinine, Ser: 1.91 mg/dL — ABNORMAL HIGH (ref 0.61–1.24)
GFR, Estimated: 37 mL/min — ABNORMAL LOW (ref >60)
Glucose, Bld: 131 mg/dL — ABNORMAL HIGH (ref 70–99)
Phosphorus: 4.2 mg/dL (ref 2.5–4.6)
Potassium: 4.3 mmol/L (ref 3.5–5.1)
Sodium: 147 mmol/L — ABNORMAL HIGH (ref 135–145)

## 2024-02-09 LAB — CBC
HCT: 29.1 % — ABNORMAL LOW (ref 39.0–52.0)
Hemoglobin: 9.1 g/dL — ABNORMAL LOW (ref 13.0–17.0)
MCH: 27.4 pg (ref 26.0–34.0)
MCHC: 31.3 g/dL (ref 30.0–36.0)
MCV: 87.7 fL (ref 80.0–100.0)
Platelets: 416 K/uL — ABNORMAL HIGH (ref 150–400)
RBC: 3.32 MIL/uL — ABNORMAL LOW (ref 4.22–5.81)
RDW: 18 % — ABNORMAL HIGH (ref 11.5–15.5)
WBC: 11.4 K/uL — ABNORMAL HIGH (ref 4.0–10.5)
nRBC: 0 % (ref 0.0–0.2)

## 2024-02-09 LAB — GLUCOSE, CAPILLARY
Glucose-Capillary: 131 mg/dL — ABNORMAL HIGH (ref 70–99)
Glucose-Capillary: 134 mg/dL — ABNORMAL HIGH (ref 70–99)
Glucose-Capillary: 143 mg/dL — ABNORMAL HIGH (ref 70–99)
Glucose-Capillary: 160 mg/dL — ABNORMAL HIGH (ref 70–99)

## 2024-02-09 LAB — HEPARIN LEVEL (UNFRACTIONATED)
Heparin Unfractionated: 0.28 [IU]/mL — ABNORMAL LOW (ref 0.30–0.70)
Heparin Unfractionated: 0.33 [IU]/mL (ref 0.30–0.70)

## 2024-02-09 LAB — MAGNESIUM: Magnesium: 2.2 mg/dL (ref 1.7–2.4)

## 2024-02-09 LAB — COOXEMETRY PANEL
Carboxyhemoglobin: 1.9 % — ABNORMAL HIGH (ref 0.5–1.5)
Methemoglobin: <0.7 % (ref 0.0–1.5)
O2 Saturation: 76.5 %
Total hemoglobin: 8.7 g/dL — ABNORMAL LOW (ref 12.0–16.0)

## 2024-02-09 MED ORDER — IOHEXOL 300 MG/ML  SOLN
100.0000 mL | Freq: Once | INTRAMUSCULAR | Status: AC | PRN
  Administered 2024-02-09: 25 mL via ORAL

## 2024-02-09 MED ORDER — TRACE MINERALS CU-MN-SE-ZN 300-55-60-3000 MCG/ML IV SOLN
INTRAVENOUS | Status: AC
  Filled 2024-02-09: qty 772.8

## 2024-02-09 MED ORDER — FUROSEMIDE 10 MG/ML IJ SOLN
160.0000 mg | Freq: Once | INTRAVENOUS | Status: AC
  Administered 2024-02-09: 160 mg via INTRAVENOUS
  Filled 2024-02-09: qty 16

## 2024-02-09 MED ORDER — HYDROMORPHONE HCL 1 MG/ML IJ SOLN
1.0000 mg | Freq: Once | INTRAMUSCULAR | Status: AC
  Administered 2024-02-09: 1 mg via INTRAVENOUS
  Filled 2024-02-09: qty 1

## 2024-02-09 NOTE — Progress Notes (Signed)
 Occupational Therapy Treatment Patient Details Name: Billy [name] MRN: [id number] DOB: Jul 17, 1950 Today's Date: 02/09/2024   History of present illness Billy [name] is a 73 y.o. male who presented to [facility] ED 01/21/24 with several days of vomiting followed by an episode of severe tearing epigastric pain. Work-up revealed esophageal tear. Pt s/p esophageal stent with EGD 10/7. Return to OR for esophageal stent adjustment on 10/15, remained intubated after procedure due to suspected aspiration. Extubated 10/16.  Pt had CT placed 10/16. Reintubated 10/20-10/24. PMHx: HFrEF, AFib, CAD, PE complicated by cardiogenic shock, CKD 4, MI, peripheral neuropathy, and TIA.   OT comments  Pt progressing well towards goals. Progressed to complete STS with mod +2 assist. Continues to be limited by decreased activity tolerance, standing <5 minutes at a time. X10 reps of incentive spirometer to decrease respiration rate and improve lung function. Continue to recommend >3 hours of skilled rehab daily to optimize independence levels. Will continue to follow acutely.      If plan is discharge home, recommend the following:  Two people to help with walking and/or transfers;Two people to help with bathing/dressing/bathroom;Assistance with cooking/housework;Assistance with feeding;Direct supervision/assist for medications management;Direct supervision/assist for financial management;Assist for transportation;Help with stairs or ramp for entrance   Equipment Recommendations  Other (comment) (Defer)       Precautions / Restrictions Precautions Precautions: Fall Recall of Precautions/Restrictions: Impaired Precaution/Restrictions Comments: BIL chest tubes in place; Restrictions Weight Bearing Restrictions Per Provider Order: No       Mobility Bed Mobility Overal bed mobility: Needs Assistance Bed Mobility: Rolling, Sidelying to Sit, Sit to Sidelying Rolling: Contact guard assist Sidelying to sit: HOB elevated, Min  assist     Sit to sidelying: +2 for physical assistance, +2 for safety/equipment, Mod assist General bed mobility comments: Min assist for trunk to come to sitting. +2 assist to return to bed    Transfers Overall transfer level: Needs assistance Equipment used: 2 person hand held assist Transfers: Sit to/from Stand Sit to Stand: Mod assist, +2 physical assistance, +2 safety/equipment           General transfer comment: Mod assist +2 to come to stand. Pt with flexed posture, able to complete lateral steps along EOB. Minimal foot clearance from the floor     Balance Overall balance assessment: Needs assistance Sitting-balance support: Feet supported, Bilateral upper extremity supported Sitting balance-Leahy Scale: Poor     Standing balance support: During functional activity, Single extremity supported, Bilateral upper extremity supported Standing balance-Leahy Scale: Poor Standing balance comment: BIL UE support     ADL either performed or assessed with clinical judgement   ADL Overall ADL's : Needs assistance/impaired       Toileting- Clothing Manipulation and Hygiene: +2 for physical assistance;+2 for safety/equipment;Total assistance Toileting - Clothing Manipulation Details (indicate cue type and reason): Total assist for hygiene in standing with +2 assist to maintain standing     Functional mobility during ADLs: Moderate assistance;+2 for safety/equipment;+2 for physical assistance General ADL Comments: Limited d/t decreased activity tolerance    Extremity/Trunk Assessment Upper Extremity Assessment Upper Extremity Assessment: Generalized weakness   Lower Extremity Assessment Lower Extremity Assessment: Defer to PT evaluation                 Communication Communication Communication: Impaired Factors Affecting Communication: Difficulty expressing self   Cognition Arousal: Alert Behavior During Therapy: Flat affect Cognition: Difficult to  assess Difficult to assess due to: Impaired communication     OT - Cognition Comments:  Delayed processing speed, pt with limited verbalizations throughout session, will continue to assess       Following commands: Impaired Following commands impaired: Follows one step commands with increased time, Follows one step commands inconsistently      Cueing   Cueing Techniques: Verbal cues, Gestural cues, Tactile cues  Exercises Other Exercises Other Exercises: x10 incentive spirometer pulls. Best pull 750 ml       General Comments VSS on HHFNC    Pertinent Vitals/ Pain       Pain Assessment Pain Assessment: Faces Faces Pain Scale: Hurts little more Pain Location: Abdomen Pain Descriptors / Indicators: Grimacing, Guarding Pain Intervention(s): Limited activity within patient's tolerance   Frequency  Min 2X/week        Progress Toward Goals  OT Goals(current goals can now be found in the care plan section)  Progress towards OT goals: Progressing toward goals  Acute Rehab OT Goals OT Goal Formulation: With patient Time For Goal Achievement: 02/21/24 Potential to Achieve Goals: Fair ADL Goals Pt Will Perform Grooming: with supervision;sitting Pt Will Perform Lower Body Bathing: with supervision;sit to/from stand Pt Will Perform Upper Body Dressing: with min assist;sitting Pt Will Perform Lower Body Dressing: with supervision;sit to/from stand Pt Will Transfer to Toilet: with mod assist;stand pivot transfer Pt Will Perform Toileting - Clothing Manipulation and hygiene: with mod assist;sitting/lateral leans Additional ADL Goal #1: Pt will complete bed mobility with mod assist in preparation for ADLs.  Plan      Co-evaluation    PT/OT/SLP Co-Evaluation/Treatment: Yes Reason for Co-Treatment: Complexity of the patient's impairments (multi-system involvement);Necessary to address cognition/behavior during functional activity;For patient/therapist safety;To address  functional/ADL transfers PT goals addressed during session: Mobility/safety with mobility;Balance;Strengthening/ROM OT goals addressed during session: ADL's and self-care      AM-PAC OT 6 Clicks Daily Activity     Outcome Measure   Help from another person eating meals?: Total Help from another person taking care of personal grooming?: A Little Help from another person toileting, which includes using toliet, bedpan, or urinal?: Total Help from another person bathing (including washing, rinsing, drying)?: A Lot Help from another person to put on and taking off regular upper body clothing?: A Little Help from another person to put on and taking off regular lower body clothing?: A Lot 6 Click Score: 12    End of Session Equipment Utilized During Treatment: Oxygen   OT Visit Diagnosis: Muscle weakness (generalized) (M62.81)   Activity Tolerance Patient limited by fatigue   Patient Left in bed;with call bell/phone within reach;with bed alarm set   Nurse Communication Mobility status        Time: 9095-9056 OT Time Calculation (min): 39 min  Charges: OT General Charges $OT Visit: 1 Visit OT Treatments $Self Care/Home Management : 8-22 mins  Adrianne BROCKS, OT  Acute Rehabilitation Services Office [phone] Secure chat preferred   Adrianne GORMAN Savers 02/09/2024, 12:47 PM

## 2024-02-09 NOTE — Progress Notes (Addendum)
 Advanced Heart Failure Rounding Note  Cardiologist: Dorn Lesches, MD  Chief Complaint: S/P esophageal stent Subjective:    - S/p EGD with stent repositioning by Dr. Shyrl 10/15.  - Limited echo 10/17 with EF 50-55%, RV mildly reduced  Co-ox 77% CVP 4. Extubated yesterday afternoon. On HFCL 35L, 40% 9L (net -6.2L). Weight 3lbs up from pre-op. sCr stable Tm 99.3. WBC 13.5>11.4. On Vanc+ mero + fluconazole for aspiration PNA Respiratory Cx +rare yeast. BCx NGTD    Sitting up in bed. Just worked with PT. Dazed. Denies SOB. Reports pain, however will not indicate where is hurting.  Objective:    Weight Range: 79.6 kg Body mass index is 23.15 kg/m.   Vital Signs:   Temp:  [98.1 F (36.7 C)-99.3 F (37.4 C)] 98.6 F (37 C) (10/24 0823) Pulse Rate:  [56-86] 56 (10/24 0600) Resp:  [19-40] 24 (10/24 0600) BP: (106-187)/(44-79) 122/49 (10/24 0600) SpO2:  [97 %-100 %] 100 % (10/24 0746) FiO2 (%):  [40 %-50 %] 40 % (10/24 0746) Weight:  [79.6 kg] 79.6 kg (10/24 0401) Last BM Date : (S) 02/09/24  Weight change: Filed Weights   02/07/24 0500 02/08/24 0500 02/09/24 0401  Weight: 84.3 kg 82.7 kg 79.6 kg   Intake/Output:  Intake/Output Summary (Last 24 hours) at 02/09/2024 0912 Last data filed at 02/09/2024 0630 Gross per 24 hour  Intake 2650.99 ml  Output 9210 ml  Net -6559.01 ml    Physical Exam   General: Acutely-ill appearing. No distress on RA Cardiac: JVP flat. S1 and S2 present. No murmurs  Resp: Lung sounds coarse Extremities: Warm and dry.  2+ edema.  Neuro: Dazed, oriented x2  Telemetry   VP 70s (personally reviewed)  Labs    CBC Recent Labs    02/08/24 0457 02/09/24 0411  WBC 13.5* 11.4*  HGB 8.5* 9.1*  HCT 26.8* 29.1*  MCV 88.4 87.7  PLT 458* 416*   Basic Metabolic Panel Recent Labs    89/76/74 0457 02/09/24 0410 02/09/24 0411  NA 140  141 147*  --   K 4.1  4.1 4.3  --   CL 106  105 109  --   CO2 22  23 26   --   GLUCOSE  202*  205* 131*  --   BUN 123*  123* 120*  --   CREATININE 1.91*  1.88* 1.91*  --   CALCIUM  8.0*  8.1* 8.7*  --   MG 2.2  --  2.2  PHOS 4.2  4.2 4.2  --    Liver Function Tests Recent Labs    02/08/24 0457 02/09/24 0410  AST 33  --   ALT 40  --   ALKPHOS 108  --   BILITOT 0.7  --   PROT 5.6*  --   ALBUMIN  <1.5*  <1.5* <1.5*   BNP (last 3 results) Recent Labs    03/09/23 1544 03/11/23 0218 06/27/23 1116  BNP 660.9* 491.3* 646.4*   Hemoglobin A1C No results for input(s): HGBA1C in the last 72 hours.  Fasting Lipid Panel Recent Labs    02/08/24 1139  TRIG 49   Medications:    Scheduled Medications:  bisacodyl   10 mg Rectal Q0600   Chlorhexidine  Gluconate Cloth  6 each Topical Daily   ipratropium-albuterol   3 mL Nebulization Q6H   lidocaine   1 patch Transdermal Q24H   mouth rinse  15 mL Mouth Rinse TID   pantoprazole  (PROTONIX ) IV  40 mg Intravenous Q12H   sodium  chloride flush  10 mL Intrapleural Q8H   sodium chloride  flush  10-40 mL Intracatheter Q12H   sodium chloride  HYPERTONIC  4 mL Nebulization BID    Infusions:  amiodarone  30 mg/hr (02/09/24 0400)   dexmedetomidine  (PRECEDEX ) IV infusion 0.2 mcg/kg/hr (02/09/24 0400)   fluconazole (DIFLUCAN) IV Stopped (02/08/24 1143)   heparin  1,950 Units/hr (02/09/24 0809)   meropenem (MERREM) IV Stopped (02/08/24 2315)   norepinephrine  (LEVOPHED ) Adult infusion Stopped (02/08/24 0745)   TPN ADULT (ION) 70 mL/hr at 02/09/24 0400    PRN Medications: hydrALAZINE , HYDROmorphone  (DILAUDID ) injection, levalbuterol, LORazepam , ondansetron  (ZOFRAN ) IV, mouth rinse, sodium chloride  flush  Patient Profile    Patient with longstanding history of chronic heart failure with preserved ejection fraction, RV failure with history of pulmonary embolism presents with esophageal perforation.  S/P Stent Esophageal Stent. Stent repositioned 10/15.  Assessment/Plan   1.  Esophageal Perforation - Boerhaave syndrome.  -  CT C/A/P: with pneumomediastinum and large mass-like density in lower esophagus - Prior EGD 3/25: erosive gastritis, duodenal deformity, and bleeding friable duodenal mucosa s/p hemostatic - 10/25 S/P EGD with esophageal stent placed  - Stent migrated w/ leak>back to OR 10/15 for repositioning  - On TPN; Strict NPO - Continues on fluconazole; abx escalated d/t PNA, now on Vanc + Meropenum     2. Acute on chronic HFpEF: h/o cardiogenic shock with RV failure in the setting of cardiac arrest thought to be caused by acute PE vs ACS in 3/25. Echo at the time showed EF 55%, severe, LVH, G2DD, and severely reduced RV function with strain. There was concern for cardiac sarcoid amyloid, however CMR LGE consistent with ischemic disease. RV on echo 9/25 with complete recovery, EF 60-65%, nl RV function.    - RHC 3/25: Mild PAH with severe RV failure though CO is preserved.   - Repeat echo 10/17 with EF 50-55%, RV mildly reduced  - Co-ox 77%. CVP 4, hold diuretics this morning, reassess this afternoon. - with 3rd spacing needs to revascularize fluids - replace TED hose - GDMT limited by renal function  - On hydralazine  PRN   3. 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 Medical management.  - strict NPO, start atova when taking PO   4.  AKI on CKD Stage 3b: Baseline sCr ~ 2.1. - Creatinine peaked to 2.6. SCr down to 2.2 today w/ diuresis  - continue IV Lasix  for renal venous decongestion - d/w CCM doubt elevated BUN d/t GIB but will closely monitor. TPN likely contributing  - follow BMP     5. PAF:   - s/p TEE/DCCV 3/25 - has converted to AF a couple times this admit - in NSR on tele   - continue IV amio and IV heparin      6.  H/o DVT/PE:  Bilateral upper and lower DVTs, mixed acute and chronic.  - s/p infrarenal IVC placement 3/25 - V/Q 3/25 with multiple perfusion defects - continue IV heparin . Will need lifelong AC, transition  to Eliquis  once taking PO   7. Carotid stenosis: h/o CVA. TCAR in 6/24. Okay to remain off plavix  at this point. - carotid US  9/25: widely patent carotid stent on R and no stenosis on L   8. Acute Hypoxic Respiratory Failure/PNA/ Exudative Lt Pleural Effusion  - on Vanc + meropenum for aspiration PNA  - CT placed for pleural effusion. Respiratory Cx w/ rare yeast, BCx NGTD  -  now extubated on HFNC - diuresis as above  [place] Lee, NP 02/09/2024  Advanced Heart Failure Team Pager [phone] (M-F; 7a - 5p)  Please contact [facility] for night-coverage after hours (4p -7a ) and weekends on [email or web address]  Patient seen and examined with the above-signed Advanced Practice Provider and/or Housestaff. I personally reviewed laboratory data, imaging studies and relevant notes. I independently examined the patient and formulated the important aspects of the plan. I have edited the note to reflect any of my changes or salient points. I have personally discussed the plan with the patient and/or family.  Extubated yesterday. Respiratory stable on HFNC  Diuresing well on high-dose lasix . Co-ox 77% CVP 4   Remains in NSR. On iv amio.   Remains on TPN.   For barium swallow today  General:  chronically ill appearing. No resp difficulty HEENT: normal + HFNC Neck: supple. no JVD. Carotids 2+ bilat; no bruits. No lymphadenopathy or thryomegaly appreciated. Cor: PMI nondisplaced. Regular rate & rhythm. No rubs, gallops or murmurs. Lungs: clear Abdomen: soft, nontender, nondistended. No hepatosplenomegaly. No bruits or masses. Hypoactive bowel sounds. Extremities: no cyanosis, clubbing, rash, 2-3+ edema + UNNA Neuro: alert & orientedx3, cranial nerves grossly intact. moves all 4 extremities w/o difficulty. Affect pleasant  Remains in NSR. Continue IV amio and heparin  while NPO  He is third-spacing. Continue UNNA boots and lasix .   Toribio Fuel, MD  2:27 PM

## 2024-02-09 NOTE — Progress Notes (Signed)
 PHARMACY - ANTICOAGULATION CONSULT NOTE  Pharmacy Consult for heparin  infusion Indication: atrial fibrillation  Allergies  Allergen Reactions   Norvasc  [Amlodipine ] Swelling and Other (See Comments)    Excessive BLE swelling Skin discoloration, blotching   Jardiance  [Empagliflozin ] Itching and Other (See Comments)    Patient mentioned penile swelling, pain   Morphine And Codeine Itching    Opioid-induced pruritus    Patient Measurements: Height: 6' 1 (185.4 cm) Weight: 79.6 kg (175 lb 7.8 oz) IBW/kg (Calculated) : 79.9 HEPARIN  DW (KG): 80.6  Vital Signs: Temp: 98.7 F (37.1 C) (10/24 1115) Temp Source: Oral (10/24 1115) BP: 151/67 (10/24 1504) Pulse Rate: 62 (10/24 1504)  Labs: Recent Labs    02/07/24 0447 02/07/24 1740 02/08/24 0457 02/08/24 0549 02/08/24 1649 02/09/24 0410 02/09/24 0411 02/09/24 1453  HGB 8.0*  --  8.5*  --   --   --  9.1*  --   HCT 24.5*  --  26.8*  --   --   --  29.1*  --   PLT 401*  --  458*  --   --   --  416*  --   APTT 90* 97*  --  101*  --   --   --   --   HEPARINUNFRC 0.64 0.43  --  0.54 0.33 0.28*  --  0.33  CREATININE 2.19*  --  1.91*  1.88*  --   --  1.91*  --   --     Estimated Creatinine Clearance: 39.9 mL/min (A) (by C-G formula based on SCr of 1.91 mg/dL (H)).   Assessment: 73 yo M presenting for esophageal rupture, now s/p esophageal stent placement on 10/6. PMH significant for HFrEF, Afib on Eliquis  PTA. Last dose of Eliquis  01/20/24, time unknown. Pharmacy consulted for heparin  management.  10/15 - underwent EGD with stent reposition on 10/15 after finding esophageal leak  Repeat heparin  level tonight is therapeutic at 0.33.  Goal of Therapy:  Heparin  level 0.3-0.5 units/ml Monitor platelets by anticoagulation protocol: Yes   Plan:  Continue IV heparin  1950 units/hr Monitor heparin  level daily Monitor CBC and s/sx of bleeding daily   Ozell Jamaica, PharmD, BCPS, [facility] Clinical Pharmacist [phone] Please  check AMION for all [facility] Pharmacy numbers 02/09/2024

## 2024-02-09 NOTE — Progress Notes (Signed)
 Nutrition Follow-up  DOCUMENTATION CODES:   Severe malnutrition in context of acute illness/injury  INTERVENTION:   If able to take po post barium swallow, recommend encouraging free water  intake  Continue TPN to meet 100% estimated nutritional needs -Recommend considering increasing free water  in TPN (un-concentrating)   NUTRITION DIAGNOSIS:   Severe Malnutrition related to acute illness (may have chronic component due to time frame of reported wt loss) as evidenced by severe fat depletion, moderate muscle depletion.  Continues   GOAL:   Patient will meet greater than or equal to 90% of their needs  Progressing  MONITOR:   Diet advancement, Labs, Weight trends, I & O's, Skin (TPN)  REASON FOR ASSESSMENT:   Consult, Rounds New TPN/TNA  ASSESSMENT:   73 yo male admitted post several days of N/V followed by sudden onset of severe epigastric pain radiating to chest and back. Pt found to have small esophageal perforation above GE junction. PMH includes CAD, chronic HFpEF, HTN, HLD, DVT, CVA, PAF, CKD 3b.  10/05 Admitted 10/06 Esophagogram/barium study: distal esophageal perforation, TPN started 10/07 S/p Esophageal stent, TPN at goal rate of 80 ml/hr 10/08  CLD 10/10 Diet advanced to Dysphagia 3, Calorie Count 10/13 No meal tickets for calorie count, pt reports eating bites only 10/15 Esophageal stent migration with leak on esophagram with return to OR for stent repositioning, likely aspiration event requiring intubation 10/16 Extubated, Chest tube placed 10/17 Limited echo with EF 50-55%, RV mildly reduced 10/20 Intubated early AM, L pleural effusion, pigtail chest tube inserted  Extubated yesterday, currently on HHFNC. Currently on precedex   NPO, Barium swallow today   TPN at 70 ml/hr providing 2301 kcals, 116 g of protein  CVP 0 to 4 this AM, BUN remains >100, sodium up to 147. Albumin  <1.5.  Received 160 mg IV lasix  TID yesterday, UOP 9L in 24 hours. Receiving  additional IV lasix  today UOP 9 L in 24 hours, Net negative almost 6 L in 24 hours  Currently on Concentrated TPN, per Pharmacist, TPN with only 22 mL sterile water . No sodium in current TPN mixture  Still with pitting edema, especially in upper thigh/hip/sacrum   Labs: Sodium 147 (H) Chloride 109 (wdl) Potassium 4.3 (wdl) BUN 120 Creatinine 1.91 Albumin  <1.5 Magnesium  2.2 (wdl) Phosphorus 4.2 (wdl)  Meds: Lasix  160 mg IV x 1 Dulcolax suppository daily Precedex  Protonix   Diet Order:   Diet Order             Diet NPO time specified  Diet effective now                   EDUCATION NEEDS:   Education needs have been addressed  Skin:  Skin Assessment: Skin Integrity Issues: Skin Integrity Issues:: Stage I Stage I: buttocks, bilateral feet  Last BM:  10/22  Height:   Ht Readings from Last 1 Encounters:  02/07/24 6' 1 (1.854 m)    Weight:   Wt Readings from Last 1 Encounters:  02/09/24 79.6 kg    Ideal Body Weight:  83.6 kg  BMI:  Body mass index is 23.15 kg/m.  Estimated Nutritional Needs:   Kcal:  2300-2500 kcals  Protein:  115-130 g  Fluid:  2L   Betsey Finger MS, RDN, LDN, CNSC Registered Dietitian 3 Clinical Nutrition RD Inpatient Contact Info in Amion

## 2024-02-09 NOTE — TOC Progression Note (Signed)
 Transition of Care [facility]) - Progression Note    Patient Details  Name: Billy [name] MRN: [id number] Date of Birth: April 20, 1950  Transition of Care [facility]) CM/SW Contact  Justina Delcia Czar, RN Phone Number: [phone] 02/09/2024, 9:49 AM  Clinical Narrative:     Patient continues with amio gtt, IV abx, and TPN. Will have attending review for LTAC.  Will need PT/OT evaluation and recommendations. LTAC vs SNF rehab.   [employer] rep, Pam RN is follow for possible Home IV abx. Will need OPAT orders. HH arranged with [facility] for [facility]. Will need HH orders with [facility].   Chart reviewed for discharge readiness, patient not medically stable for d/c. Inpatient CM/CSW will continue to monitor pt's advancement through interdisciplinary progression rounds.  If new pt transition needs arise, MD please place a TOC consult.    Expected Discharge Plan: Home/Self Care Barriers to Discharge: Continued Medical Work up    Expected Discharge Plan and Services   Discharge Planning Services: CM Consult   Living arrangements for the past 2 months: Single Family Home                   Social Drivers of Health (SDOH) Interventions SDOH Screenings   Food Insecurity: No Food Insecurity (01/22/2024)  Housing: High Risk (01/22/2024)  Transportation Needs: No Transportation Needs (01/22/2024)  Utilities: Not At Risk (01/22/2024)  Depression (PHQ2-9): Low Risk  (12/01/2020)  Social Connections: Socially Isolated (01/22/2024)  Tobacco Use: Low Risk  (01/31/2024)    Readmission Risk Interventions    06/27/2023    1:52 PM 10/14/2022   11:29 AM  Readmission Risk Prevention Plan  Post Dischage Appt  Complete  Medication Screening  Complete  Transportation Screening Complete Complete  HRI or Home Care Consult Complete   Social Work Consult for Recovery Care Planning/Counseling Complete   Palliative Care Screening Not Applicable   Medication Review Oceanographer) Referral to Pharmacy

## 2024-02-09 NOTE — Progress Notes (Signed)
 Physical Therapy Treatment Patient Details Name: Billy [name] MRN: [id number] DOB: 10-Feb-1951 Today's Date: 02/09/2024   History of Present Illness Billy [name] is a 73 y.o. male who presented to [facility] ED 01/21/24 with several days of vomiting followed by an episode of severe tearing epigastric pain. Work-up revealed esophageal tear. Pt s/p esophageal stent with EGD 10/7. Return to OR for esophageal stent adjustment on 10/15, remained intubated after procedure due to suspected aspiration. Extubated 10/16.  Pt had CT placed 10/16. Reintubated 10/20-10/24. PMHx: HFrEF, AFib, CAD, PE complicated by cardiogenic shock, CKD 4, MI, peripheral neuropathy, and TIA.    PT Comments  Good progress today. Tolerated bed mobility and transfer training. Able to sit EOB majority of session with CGA, cues for breathing techniques due to increased RR and WOB but SpO2 91% and greater throughout session on HHFNC. Stood x2 with mod assist +2 and progressed with lateral steps along bed. BIL hand held support required and assist for weight shift and LE sequencing.  Patient will benefit from intensive inpatient follow-up therapy, >3 hours/day. Encouraged regular LE exercises, which we reviewed, between therapy visits .Patient will continue to benefit from skilled physical therapy services to further improve independence with functional mobility.    If plan is discharge home, recommend the following: Assistance with cooking/housework;Assist for transportation;Help with stairs or ramp for entrance;Two people to help with walking and/or transfers;A lot of help with bathing/dressing/bathroom;Direct supervision/assist for medications management;Direct supervision/assist for financial management;Supervision due to cognitive status   Can travel by private vehicle        Equipment Recommendations  Wheelchair (measurements PT);Wheelchair cushion (measurements PT)    Recommendations for Other Services Rehab consult     Precautions /  Restrictions Precautions Precautions: Fall Recall of Precautions/Restrictions: Impaired Precaution/Restrictions Comments: poor O2 signal on fingers; reads better on his forehead; BIL chest tubes in place; Lt coming out today hopefully Restrictions Weight Bearing Restrictions Per Provider Order: No     Mobility  Bed Mobility Overal bed mobility: Needs Assistance Bed Mobility: Rolling, Sidelying to Sit, Sit to Sidelying Rolling: Contact guard assist Sidelying to sit: HOB elevated, Min assist     Sit to sidelying: +2 for physical assistance, +2 for safety/equipment, Mod assist General bed mobility comments: CGA to roll with tactile cues to facilitate. Min assist for trunk support to rise and pull through therapist's hand. +2 Mod assist for LE and trunk support to return to supine.    Transfers Overall transfer level: Needs assistance Equipment used: 2 person hand held assist Transfers: Sit to/from Stand Sit to Stand: Mod assist, +2 physical assistance, +2 safety/equipment           General transfer comment: Mod assist +2 for boost and balance to stand from edge of bed x2. Difficulty extending back and neck to neutral.    Ambulation/Gait             Pre-gait activities: Weight shifting, mod assist to progress with lateral steps to sequence and balance with +2 support.     Stairs             Wheelchair Mobility     Tilt Bed    Modified Rankin (Stroke Patients Only)       Balance Overall balance assessment: Needs assistance Sitting-balance support: Feet supported, Bilateral upper extremity supported Sitting balance-Leahy Scale: Poor Sitting balance - Comments: CGA EOB UE support   Standing balance support: During functional activity, Single extremity supported, Bilateral upper extremity supported Standing balance-Leahy Scale: Poor Standing  balance comment: BIL UE support                            Communication  Communication Communication: Impaired Factors Affecting Communication: Difficulty expressing self  Cognition Arousal: Alert Behavior During Therapy: Flat affect   PT - Cognitive impairments: Attention, Sequencing, Problem solving                       PT - Cognition Comments: Delayed processing, doesn't consistently respond to questions Following commands: Impaired Following commands impaired: Follows one step commands with increased time, Follows one step commands inconsistently    Cueing Cueing Techniques: Verbal cues, Gestural cues, Tactile cues  Exercises General Exercises - Lower Extremity Ankle Circles/Pumps: AROM, Both, 10 reps Quad Sets: Strengthening, Both, 10 reps, Supine Hip Flexion/Marching: Both, 10 reps, Seated, AAROM    General Comments General comments (skin integrity, edema, etc.): SpO2 91-100% on HHFNC 35LPM, 40% FiO2. HR 60. BP 145/63. Denied dizziness throughout session.      Pertinent Vitals/Pain Pain Assessment Pain Assessment: Faces Faces Pain Scale: Hurts little more Pain Location: Abdomen Pain Descriptors / Indicators: Grimacing, Guarding Pain Intervention(s): Limited activity within patient's tolerance, Monitored during session, Repositioned    Home Living                          Prior Function            PT Goals (current goals can now be found in the care plan section) Acute Rehab PT Goals Patient Stated Goal: Return Home PT Goal Formulation: With patient Time For Goal Achievement: 02/16/24 Potential to Achieve Goals: Good Progress towards PT goals: Progressing toward goals    Frequency    Min 3X/week      PT Plan      Co-evaluation PT/OT/SLP Co-Evaluation/Treatment: Yes Reason for Co-Treatment: Complexity of the patient's impairments (multi-system involvement);Necessary to address cognition/behavior during functional activity;For patient/therapist safety;To address functional/ADL transfers PT goals addressed  during session: Mobility/safety with mobility;Balance;Strengthening/ROM        AM-PAC PT 6 Clicks Mobility   Outcome Measure  Help needed turning from your back to your side while in a flat bed without using bedrails?: A Little Help needed moving from lying on your back to sitting on the side of a flat bed without using bedrails?: A Lot Help needed moving to and from a bed to a chair (including a wheelchair)?: Total Help needed standing up from a chair using your arms (e.g., wheelchair or bedside chair)?: A Lot Help needed to walk in hospital room?: Total Help needed climbing 3-5 steps with a railing? : Total 6 Click Score: 10    End of Session Equipment Utilized During Treatment: Oxygen  Activity Tolerance: Patient limited by fatigue Patient left: with call bell/phone within reach;in bed;with bed alarm set;with nursing/sitter in room Nurse Communication: Mobility status PT Visit Diagnosis: Difficulty in walking, not elsewhere classified (R26.2);Unsteadiness on feet (R26.81);Other abnormalities of gait and mobility (R26.89);Muscle weakness (generalized) (M62.81);Other symptoms and signs involving the nervous system (R29.898);Pain Pain - part of body:  (abdomen)     Time: 9091-9059 PT Time Calculation (min) (ACUTE ONLY): 32 min  Charges:    $Therapeutic Activity: 8-22 mins PT General Charges $$ ACUTE PT VISIT: 1 Visit                     Leontine Roads, PT, DPT [facility]  Rehabilitation Services Physical Therapist Office: [phone] Website: [email or web address]    [name] 02/09/2024, 11:40 AM

## 2024-02-09 NOTE — Progress Notes (Signed)
     [address]       [place] [address]             [phone]       Esophagram reviewed. There is a persistent leak despite being covered by the esophageal stent.  He also has white drainage with left and right pigtail catheters.  Will options would include a GI consult for advanced endoscopy versus continued drainage.  He will require stent replacement in about 3 to 4 weeks.  Continue antibiotics and drainage for now.  Najee Manninen MALVA Rayas

## 2024-02-09 NOTE — Progress Notes (Signed)
 PHARMACY - ANTICOAGULATION CONSULT NOTE  Pharmacy Consult for heparin  infusion Indication: atrial fibrillation  Allergies  Allergen Reactions   Norvasc  [Amlodipine ] Swelling and Other (See Comments)    Excessive BLE swelling Skin discoloration, blotching   Jardiance  [Empagliflozin ] Itching and Other (See Comments)    Patient mentioned penile swelling, pain   Morphine And Codeine Itching    Opioid-induced pruritus    Patient Measurements: Height: 6' 1 (185.4 cm) Weight: 79.6 kg (175 lb 7.8 oz) IBW/kg (Calculated) : 79.9 HEPARIN  DW (KG): 80.6  Vital Signs: Temp: 99.1 F (37.3 C) (10/24 0355) Temp Source: Oral (10/24 0355) BP: 115/48 (10/23 2200) Pulse Rate: 57 (10/23 2200)  Labs: Recent Labs    02/07/24 0447 02/07/24 1740 02/08/24 0457 02/08/24 0549 02/08/24 1649 02/09/24 0410 02/09/24 0411  HGB 8.0*  --  8.5*  --   --   --  9.1*  HCT 24.5*  --  26.8*  --   --   --  29.1*  PLT 401*  --  458*  --   --   --  416*  APTT 90* 97*  --  101*  --   --   --   HEPARINUNFRC 0.64 0.43  --  0.54 0.33 0.28*  --   CREATININE 2.19*  --  1.91*  1.88*  --   --   --   --     Estimated Creatinine Clearance: 39.9 mL/min (A) (by C-G formula based on SCr of 1.91 mg/dL (H)).   Assessment: 73 yo M presenting for esophageal rupture, now s/p esophageal stent placement on 10/6. PMH significant for HFrEF, Afib on Eliquis  PTA. Last dose of Eliquis  01/20/24, time unknown. Pharmacy consulted for heparin  management.  10/15 - underwent EGD with stent reposition on 10/15 after finding esophageal leak  AM: Heparin  level 0.28, subtherapeutic on 1800 units/hr. No issues with the infusion or bleeding reported per RN.  Goal of Therapy:  Heparin  level 0.3-0.5 units/ml Monitor platelets by anticoagulation protocol: Yes   Plan:  Increase IV heparin  to 1950 units/hr Check heparin  level in 8h Monitor heparin  level daily Monitor CBC and s/sx of bleeding daily  Lynwood Poplar, PharmD, BCPS Clinical  Pharmacist 02/09/2024 5:15 AM

## 2024-02-09 NOTE — Progress Notes (Signed)
 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  Despite Dilaudid  0.5 mg, continues to have abdominal pain  eICU Interventions  One-time Dilaudid  for breakthrough pain.     Intervention Category Intermediate Interventions: Pain - evaluation and management  Jessie Cowher 02/09/2024, 3:17 AM
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
## 2024-02-09 NOTE — Progress Notes (Signed)
 PHARMACY - TOTAL PARENTERAL NUTRITION CONSULT NOTE   Indication: intolerance to enteral feedings, esophageal perforation  Patient Measurements: Height: 6' 1 (185.4 cm) Weight: 79.6 kg (175 lb 7.8 oz) IBW/kg (Calculated) : 79.9 TPN AdjBW (KG): 80.6 Body mass index is 23.15 kg/m.  Assessment:  73 year old man with medical history significant for cardiac arrest earlier this year who was admitted after several days of nausea and vomiting followed by sudden onset severe tearing epigastric pain radiating to back and chest. 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 Pharmacy consulted to initiate TPN due to inability to tolerate enteral nutrition at this time.  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 PTA takes torsemide  20 mg PO daily, now held. Aggressive fluid resuscitation recommended per GI. Per HF, patient currently euvolemic but will hold diuretic for now. Due to CHF will target lower end of fluid range for TPN and supplement additional fluids per MD outside of TPN to prevent fluid overload.   Stent placed on 10/6, esophogram not showing any leak. Diet advanced to CLD. 10/15 Leak present and made NPO.  Glucose / Insulin : A1C 6.0% (02/2023);  BG <160, used 2 units SSI and 16 units insulin  in TPN  Electrolytes: Na 147, CoCa 10.7 (none in TPN), Mg 2.2 (none in TPN), others wnl Renal: Scr 1.91 (BL ~2), BUN 120 Hepatic: AST/ALT/tbili wnl, Alk Phos 189, alb <1.5, TG 49 Intake / Output; MIVF: furosemide  in D5W 120mg  x3 >> team planning to hold diuresis 10/24; UOP 4.7 mL/kg/hr, chest tube 160 ml, LBM 10/23 Net IO Since Admission: 12,020.76 mL [02/09/24 0706]  - weight up 15 lbs since admit  GI Imaging: 10/05 CT: concerning for esophageal perforation, masslike area of soft tissue thickening adjacent to and surrounding the esophagus  10/06 Esoph XR: distal esophageal perforation 10/7 Esoph XR: No leak associated with stent coverage of esophageal  perforation 10/14 KUB: Gas-filled loops of small bowel and colon suggestive of ileus 10/15 Esoph XR: esophageal perforation/leak at the distal esophagus, proximal to the esophageal stent GI Surgeries / Procedures:  10/6: esophageal stent placement  10/15 EGD and stent repositioning   Central access: 01/22/24 TPN start date: 01/22/24  Nutritional Goals:  Goal concentrated TPN is 70 ml/hr (provides 116 g of protein and 2301 kcals per day  RD Assessment:  Estimated Needs Total Energy Estimated Needs: 2300-2500 kcals Total Protein Estimated Needs: 115-130 g Total Fluid Estimated Needs: 2L  Current Nutrition:  TPN 10/13- 10/14 DYS1: not taking any po  10/15: NPO d/t increased abd distension  10/22 Propofol  at 7.6- 20.2 ml/hr, providing ~370 kcals/24hr 10/23: switch propofol  back to dexmedetomidine   Plan:  Continue concentrated TPN at goal 70 mL/hr, providing 100% estimated nutritional needs   Electrolytes in TPN: decrease Na 0 mEq/L, decrease K 20 mEq/L (given holding diuresis), Ca 0 mEq/L, Mg 0 mEq/L, decrease Phos 2 mmol/L. Change Cl:Ac max acetate (due to low lytes in bag and cannot do 1:1 or 1:2) Continue standard MVI and trace elements to TPN Add 15 units insulin  regular to TPN , check BG q8hr Monitor TPN labs on Mon/Thurs, and PRN    Thank you for involving pharmacy in this patient's care.  Jinnie Door, PharmD, BCPS, BCCP Clinical Pharmacist  Please check AMION for all [facility] Pharmacy phone numbers After 10:00 PM, call Main Pharmacy [phone]

## 2024-02-10 DIAGNOSIS — K223 Perforation of esophagus: Secondary | ICD-10-CM | POA: Diagnosis not present

## 2024-02-10 LAB — COOXEMETRY PANEL
Carboxyhemoglobin: 2 % — ABNORMAL HIGH (ref 0.5–1.5)
Methemoglobin: 1.6 % — ABNORMAL HIGH (ref 0.0–1.5)
O2 Saturation: 64.1 %
Total hemoglobin: 9.9 g/dL — ABNORMAL LOW (ref 12.0–16.0)

## 2024-02-10 LAB — RENAL FUNCTION PANEL
Albumin: <1.5 g/dL — ABNORMAL LOW (ref 3.5–5.0)
Anion gap: 11 (ref 5–15)
BUN: 96 mg/dL — ABNORMAL HIGH (ref 8–23)
CO2: 29 mmol/L (ref 22–32)
Calcium: 8.6 mg/dL — ABNORMAL LOW (ref 8.9–10.3)
Chloride: 106 mmol/L (ref 98–111)
Creatinine, Ser: 1.55 mg/dL — ABNORMAL HIGH (ref 0.61–1.24)
GFR, Estimated: 48 mL/min — ABNORMAL LOW (ref >60)
Glucose, Bld: 154 mg/dL — ABNORMAL HIGH (ref 70–99)
Phosphorus: 2.9 mg/dL (ref 2.5–4.6)
Potassium: 3.7 mmol/L (ref 3.5–5.1)
Sodium: 146 mmol/L — ABNORMAL HIGH (ref 135–145)

## 2024-02-10 LAB — GLUCOSE, CAPILLARY
Glucose-Capillary: 128 mg/dL — ABNORMAL HIGH (ref 70–99)
Glucose-Capillary: 146 mg/dL — ABNORMAL HIGH (ref 70–99)
Glucose-Capillary: 140 mg/dL — ABNORMAL HIGH (ref 70–99)
Glucose-Capillary: 145 mg/dL — ABNORMAL HIGH (ref 70–99)
Glucose-Capillary: 153 mg/dL — ABNORMAL HIGH (ref 70–99)

## 2024-02-10 LAB — HEPARIN LEVEL (UNFRACTIONATED): Heparin Unfractionated: 0.49 [IU]/mL (ref 0.30–0.70)

## 2024-02-10 LAB — MAGNESIUM: Magnesium: 2.2 mg/dL (ref 1.7–2.4)

## 2024-02-10 MED ORDER — POTASSIUM CHLORIDE 10 MEQ/50ML IV SOLN
10.0000 meq | INTRAVENOUS | Status: AC
  Administered 2024-02-10 (×3): 10 meq via INTRAVENOUS
  Filled 2024-02-10 (×4): qty 50

## 2024-02-10 MED ORDER — FUROSEMIDE 10 MG/ML IJ SOLN
160.0000 mg | Freq: Two times a day (BID) | INTRAVENOUS | Status: AC
  Administered 2024-02-10 (×2): 160 mg via INTRAVENOUS
  Filled 2024-02-10: qty 10
  Filled 2024-02-10: qty 16

## 2024-02-10 MED ORDER — HYDROMORPHONE HCL 1 MG/ML IJ SOLN
0.5000 mg | INTRAMUSCULAR | Status: DC | PRN
  Administered 2024-02-10 – 2024-02-11 (×4): 0.5 mg via INTRAVENOUS
  Filled 2024-02-10 (×5): qty 0.5

## 2024-02-10 MED ORDER — TRACE MINERALS CU-MN-SE-ZN 300-55-60-3000 MCG/ML IV SOLN
INTRAVENOUS | Status: AC
  Filled 2024-02-10: qty 772.8

## 2024-02-10 MED ORDER — POTASSIUM CHLORIDE 10 MEQ/50ML IV SOLN
10.0000 meq | INTRAVENOUS | Status: DC
Start: 2024-02-10 — End: 2024-02-10
  Administered 2024-02-10 (×2): 10 meq via INTRAVENOUS
  Filled 2024-02-10 (×4): qty 50

## 2024-02-10 NOTE — Progress Notes (Signed)
 Advanced Heart Failure Rounding Note  Cardiologist: Dorn Lesches, MD  Chief Complaint: S/P esophageal stent Subjective:    - S/p EGD with stent repositioning by Dr. Shyrl 10/15.  - Limited echo 02/08/24 EF 60-65%  - Barium swallow 02/09/24 persistent leak   Barium swallow yesterday notable for persistent leak at distal 1/3 of esoph stent  Remains on TPN. Weak. Lethargic. Diuresing on IV lasix    Denies CP or SOB. Remains in NSR  Objective:    Weight Range: 79.6 kg Body mass index is 23.15 kg/m.   Vital Signs:   Temp:  [98 F (36.7 C)-98.8 F (37.1 C)] 98 F (36.7 C) (10/25 1100) Pulse Rate:  [55-67] 59 (10/25 1100) Resp:  [17-31] 26 (10/25 1100) BP: (122-177)/(55-72) 148/62 (10/25 1100) SpO2:  [93 %-100 %] 99 % (10/25 1100) Last BM Date : 02/09/24  Weight change: Filed Weights   02/07/24 0500 02/08/24 0500 02/09/24 0401  Weight: 84.3 kg 82.7 kg 79.6 kg   Intake/Output:  Intake/Output Summary (Last 24 hours) at 02/10/2024 1255 Last data filed at 02/10/2024 1200 Gross per 24 hour  Intake 3119.25 ml  Output 4860 ml  Net -1740.75 ml    Physical Exam   General:  Weak appearing. Lethargic No resp difficulty HEENT: normal Neck: supple. no JVD. Carotids 2+ bilat; no bruits. No lymphadenopathy or thryomegaly appreciated. Cor: Regular rate & rhythm. No rubs, gallops or murmurs. Lungs: clear Abdomen: soft, nontender, nondistended. No hepatosplenomegaly. No bruits or masses. Good bowel sounds. Extremities: no cyanosis, clubbing, rash, 2-3+ edema Neuro: alert follows commands   Telemetry   VP 60s (personally reviewed)  Labs    CBC Recent Labs    02/08/24 0457 02/09/24 0411  WBC 13.5* 11.4*  HGB 8.5* 9.1*  HCT 26.8* 29.1*  MCV 88.4 87.7  PLT 458* 416*   Basic Metabolic Panel Recent Labs    89/75/74 0410 02/09/24 0411 02/10/24 0500  NA 147*  --  146*  K 4.3  --  3.7  CL 109  --  106  CO2 26  --  29  GLUCOSE 131*  --  154*  BUN 120*   --  96*  CREATININE 1.91*  --  1.55*  CALCIUM  8.7*  --  8.6*  MG  --  2.2 2.2  PHOS 4.2  --  2.9   Liver Function Tests Recent Labs    02/08/24 0457 02/09/24 0410 02/10/24 0500  AST 33  --   --   ALT 40  --   --   ALKPHOS 108  --   --   BILITOT 0.7  --   --   PROT 5.6*  --   --   ALBUMIN  <1.5*  <1.5* <1.5* <1.5*   BNP (last 3 results) Recent Labs    03/09/23 1544 03/11/23 0218 06/27/23 1116  BNP 660.9* 491.3* 646.4*   Hemoglobin A1C No results for input(s): HGBA1C in the last 72 hours.  Fasting Lipid Panel Recent Labs    02/08/24 1139  TRIG 49   Medications:    Scheduled Medications:  bisacodyl   10 mg Rectal Q0600   Chlorhexidine  Gluconate Cloth  6 each Topical Daily   ipratropium-albuterol   3 mL Nebulization Q6H   lidocaine   1 patch Transdermal Q24H   mouth rinse  15 mL Mouth Rinse TID   pantoprazole  (PROTONIX ) IV  40 mg Intravenous Q12H   sodium chloride  flush  10 mL Intrapleural Q8H   sodium chloride  flush  10-40 mL Intracatheter Q12H  sodium chloride  HYPERTONIC  4 mL Nebulization BID    Infusions:  amiodarone  30 mg/hr (02/10/24 1229)   dexmedetomidine  (PRECEDEX ) IV infusion 0.2 mcg/kg/hr (02/10/24 1200)   fluconazole (DIFLUCAN) IV Stopped (02/10/24 1059)   furosemide  66 mL/hr at 02/10/24 1200   heparin  1,950 Units/hr (02/10/24 1200)   meropenem (MERREM) IV Stopped (02/10/24 9057)   norepinephrine  (LEVOPHED ) Adult infusion Stopped (02/08/24 0745)   potassium chloride  10 mEq (02/10/24 1156)   TPN ADULT (ION) 70 mL/hr at 02/10/24 1200   TPN ADULT (ION)      PRN Medications: hydrALAZINE , HYDROmorphone  (DILAUDID ) injection, levalbuterol, LORazepam , ondansetron  (ZOFRAN ) IV, mouth rinse, sodium chloride  flush  Patient Profile    Patient with longstanding history of chronic heart failure with preserved ejection fraction, RV failure with history of pulmonary embolism presents with esophageal perforation.  S/P Stent Esophageal Stent. Stent  repositioned 10/15.  Assessment/Plan   1.  Esophageal Perforation - Boerhaave syndrome.  - CT C/A/P: with pneumomediastinum and large mass-like density in lower esophagus - 10/25 S/P EGD with esophageal stent placed  - Stent migrated w/ leak>back to OR 10/15 for repositioning  - Barium swallow 10/24 persistent leak  - On TPN; Strict NPO - Continues on fluconazole; abx escalated d/t PNA, now on Vanc + Meropenum     2. Acute on chronic HFpEF: h/o cardiogenic shock with RV failure in the setting of cardiac arrest thought to be caused by acute PE vs ACS in 3/25. Echo at the time showed EF 55%, severe, LVH, G2DD, and severely reduced RV function with strain. There was concern for cardiac sarcoid amyloid, however CMR LGE consistent with ischemic disease. RV on echo 9/25 with complete recovery, EF 60-65%, nl RV function.    - RHC 3/25: Mild PAH with severe RV failure though CO is preserved.   - Repeat echo 10/24 EF 60-65% - Co-ox 64% (can stop checking) - Marked fluid overload with 3rd spacing needs to revascularize fluids - place UNNA - Continue IV lasix   3. 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 Medical management.  - strict NPO, start atova when taking PO   4.  AKI on CKD Stage 3b: Baseline sCr ~ 2.1. - Creatinine peaked to 2.6. SCr down to 1.55 today w/ diuresis  - continue IV Lasix  for renal venous decongestion    5. PAF:   - s/p TEE/DCCV 3/25 - has converted to AF a couple times this admit - in NSR on tele   - continue IV amio and heparin   6.  H/o DVT/PE:  Bilateral upper and lower DVTs, mixed acute and chronic.  - s/p infrarenal IVC placement 3/25 - V/Q 3/25 with multiple perfusion defects - continue IV heparin . Will need lifelong AC, transition to Eliquis  once taking PO - no change   7. Carotid stenosis: h/o CVA. TCAR in 6/24. Okay to remain off plavix  at this point. - carotid US  9/25: widely patent  carotid stent on R and no stenosis on L   8. Acute Hypoxic Respiratory Failure/PNA/ Exudative Lt Pleural Effusion  - on Vanc + meropenum for aspiration PNA  - CT placed for pleural effusion. Respiratory Cx w/ rare yeast, BCx NGTD  - now extubated on HFNC - diuresis as above  9. Severe protein calorie malnutrition - Nutrition following. On TPN   Toribio Fuel, MD 02/10/2024  Advanced Heart Failure Team Pager [phone] (M-F; 7a - 5p)  Please contact [facility]  for night-coverage after hours (4p -7a ) and weekends on [email or web address]

## 2024-02-10 NOTE — Plan of Care (Signed)
  Problem: Health Behavior/Discharge Planning: Goal: Ability to manage health-related needs will improve Outcome: Progressing   Problem: Clinical Measurements: Goal: Ability to maintain clinical measurements within normal limits will improve Outcome: Progressing Goal: Will remain free from infection Outcome: Progressing Goal: Diagnostic test results will improve Outcome: Progressing Goal: Respiratory complications will improve Outcome: Progressing Goal: Cardiovascular complication will be avoided Outcome: Progressing   Problem: Activity: Goal: Risk for activity intolerance will decrease Outcome: Progressing   Problem: Nutrition: Goal: Adequate nutrition will be maintained Outcome: Progressing   Problem: Coping: Goal: Level of anxiety will decrease Outcome: Progressing   
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
## 2024-02-10 NOTE — Progress Notes (Signed)
 PHARMACY - TOTAL PARENTERAL NUTRITION CONSULT NOTE   Indication: intolerance to enteral feedings, esophageal perforation  Patient Measurements: Height: 6' 1 (185.4 cm) Weight: 79.6 kg (175 lb 7.8 oz) IBW/kg (Calculated) : 79.9 TPN AdjBW (KG): 80.6 Body mass index is 23.15 kg/m.  Assessment:  73 year old man with medical history significant for cardiac arrest earlier this year who was admitted after several days of nausea and vomiting followed by sudden onset severe tearing epigastric pain radiating to back and chest. 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 Pharmacy consulted to initiate TPN due to inability to tolerate enteral nutrition at this time.  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 PTA takes torsemide  20 mg PO daily, now held. Aggressive fluid resuscitation recommended per GI. Per HF, patient currently euvolemic but will hold diuretic for now. Due to CHF will target lower end of fluid range for TPN and supplement additional fluids per MD outside of TPN to prevent fluid overload.   Stent placed on 10/6, esophogram not showing any leak. Diet advanced to CLD. 10/15 Leak present and made NPO. 10/24 esophagram with persistent leak; will need stent replacement in 3-4 weeks.   Glucose / Insulin : A1C 6.0% (02/2023);  BG <160, 15 units insulin  in TPN  Electrolytes: Na 146, CoCa 10.6 (none in TPN), Phos 2.9 down, Mg 2.2 (none in TPN), others wnl Renal: Scr 1.55 down (BL ~2), BUN 96 down Hepatic: AST/ALT/tbili wnl, Alk Phos 189, alb <1.5, TG 49 Intake / Output; MIVF: furosemide  160mg  x1; UOP 2.2 mL/kg/hr, chest tube 110 ml, LBM 10/23 Net IO Since Admission: 11,070.91 mL [02/10/24 0706]  - weight up 15 lbs since admit  GI Imaging: 10/05 CT: concerning for esophageal perforation, masslike area of soft tissue thickening adjacent to and surrounding the esophagus  10/06 Esoph XR: distal esophageal perforation 10/7 Esoph XR: No leak associated  with stent coverage of esophageal perforation 10/14 KUB: Gas-filled loops of small bowel and colon suggestive of ileus 10/15 Esoph XR: esophageal perforation/leak at the distal esophagus, proximal to the esophageal stent 10/24 esophagram: persistent leak despite being covered by the esophageal stent.  GI Surgeries / Procedures:  10/6: esophageal stent placement  10/15 EGD and stent repositioning   Central access: 01/22/24 TPN start date: 01/22/24  Nutritional Goals:  Goal concentrated TPN is 70 ml/hr (provides 116 g of protein and 2301 kcals per day  RD Assessment:  Estimated Needs Total Energy Estimated Needs: 2300-2500 kcals Total Protein Estimated Needs: 115-130 g Total Fluid Estimated Needs: 2L  Current Nutrition:  TPN 10/13- 10/14 DYS1: not taking any po  10/15: NPO d/t increased abd distension  10/22 Propofol  at 7.6- 20.2 ml/hr, providing ~370 kcals/24hr 10/23: switch propofol  back to dexmedetomidine   Plan:  Continue concentrated TPN at goal 70 mL/hr, providing 100% estimated nutritional needs   Electrolytes in TPN: Na 0 mEq/L, increase K 30 mEq/L, Ca 0 mEq/L, Mg 0 mEq/L, increase Phos 10 mmol/L. Change Cl:Ac max acetate (cannot do 1:1 or 1:2 due to no Na in bag ) Continue standard MVI and trace elements to TPN Add 13 units insulin  regular to TPN , check BG q8hr Monitor TPN labs on Mon/Thurs, and PRN    Thank you for involving pharmacy in this patient's care.  Jinnie Door, PharmD, BCPS, BCCP Clinical Pharmacist  Please check AMION for all [facility] phone numbers After 10:00 PM, call Main Pharmacy [phone]

## 2024-02-10 NOTE — Progress Notes (Signed)
 PHARMACY - ANTICOAGULATION CONSULT NOTE  Pharmacy Consult for heparin  infusion Indication: atrial fibrillation  Allergies  Allergen Reactions   Norvasc  [Amlodipine ] Swelling and Other (See Comments)    Excessive BLE swelling Skin discoloration, blotching   Jardiance  [Empagliflozin ] Itching and Other (See Comments)    Patient mentioned penile swelling, pain   Morphine And Codeine Itching    Opioid-induced pruritus    Patient Measurements: Height: 6' 1 (185.4 cm) Weight: 79.6 kg (175 lb 7.8 oz) IBW/kg (Calculated) : 79.9 HEPARIN  DW (KG): 80.6  Vital Signs: Temp: 98 F (36.7 C) (10/25 0700) Temp Source: Axillary (10/25 0700) BP: 148/62 (10/25 1100) Pulse Rate: 59 (10/25 1100)  Labs: Recent Labs    02/07/24 1740 02/08/24 0457 02/08/24 0549 02/08/24 1649 02/09/24 0410 02/09/24 0411 02/09/24 1453 02/10/24 0500  HGB  --  8.5*  --   --   --  9.1*  --   --   HCT  --  26.8*  --   --   --  29.1*  --   --   PLT  --  458*  --   --   --  416*  --   --   APTT 97*  --  101*  --   --   --   --   --   HEPARINUNFRC 0.43  --  0.54   < > 0.28*  --  0.33 0.49  CREATININE  --  1.91*  1.88*  --   --  1.91*  --   --  1.55*   < > = values in this interval not displayed.    Estimated Creatinine Clearance: 49.2 mL/min (A) (by C-G formula based on SCr of 1.55 mg/dL (H)).   Assessment: 73 yo M presenting for esophageal rupture, now s/p esophageal stent placement on 10/6. PMH significant for HFrEF, Afib on Eliquis  PTA. Last dose of Eliquis  01/20/24, time unknown. Pharmacy consulted for heparin  management.  10/15 - underwent EGD with stent reposition on 10/15 after finding esophageal leak  02/10/24 AM: Heparin  level 0.49, therapeutic at heparin  1950 units/hr. No issues with infusion running or signs of bleeding per RN. CBC stable (Hgb 9.1, PLT 416).  Goal of Therapy:  Heparin  level 0.3-0.5 units/ml Monitor platelets by anticoagulation protocol: Yes   Plan:  Continue IV heparin  to  1950 units/hr Monitor heparin  level daily Monitor CBC and s/sx of bleeding daily  Morna Breach, PharmD PGY2 Cardiology Pharmacy Resident 02/10/2024 11:18 AM

## 2024-02-10 NOTE — Progress Notes (Signed)
 eLink Physician-Brief Progress Note Patient Name: Billy [name] DOB: [birth year] MRN: [id number]   Date of Service  02/10/2024  HPI/Events of Note  Pt continues to complain of sharp 10/10 pain despite PRN dilaudid .  Would become tachypneic and tachycardic  with episodes of breakthrough pain.   eICU Interventions  Increased frequency of dilaudid  0.5mg  IV to q2hours PRN. Will continue to monitor closely.         Zakiah Beckerman M DELA CRUZ 02/10/2024, 8:59 PM

## 2024-02-11 DIAGNOSIS — K223 Perforation of esophagus: Secondary | ICD-10-CM | POA: Diagnosis not present

## 2024-02-11 LAB — CBC
HCT: 29.8 % — ABNORMAL LOW (ref 39.0–52.0)
Hemoglobin: 9.3 g/dL — ABNORMAL LOW (ref 13.0–17.0)
MCH: 27.5 pg (ref 26.0–34.0)
MCHC: 31.2 g/dL (ref 30.0–36.0)
MCV: 88.2 fL (ref 80.0–100.0)
Platelets: 347 K/uL (ref 150–400)
RBC: 3.38 MIL/uL — ABNORMAL LOW (ref 4.22–5.81)
RDW: 17.9 % — ABNORMAL HIGH (ref 11.5–15.5)
WBC: 12.7 K/uL — ABNORMAL HIGH (ref 4.0–10.5)
nRBC: 0 % (ref 0.0–0.2)

## 2024-02-11 LAB — RENAL FUNCTION PANEL
Albumin: <1.5 g/dL — ABNORMAL LOW (ref 3.5–5.0)
Anion gap: 12 (ref 5–15)
BUN: 82 mg/dL — ABNORMAL HIGH (ref 8–23)
CO2: 28 mmol/L (ref 22–32)
Calcium: 8.7 mg/dL — ABNORMAL LOW (ref 8.9–10.3)
Chloride: 107 mmol/L (ref 98–111)
Creatinine, Ser: 1.51 mg/dL — ABNORMAL HIGH (ref 0.61–1.24)
GFR, Estimated: 49 mL/min — ABNORMAL LOW (ref >60)
Glucose, Bld: 150 mg/dL — ABNORMAL HIGH (ref 70–99)
Phosphorus: 3.3 mg/dL (ref 2.5–4.6)
Potassium: 4 mmol/L (ref 3.5–5.1)
Sodium: 147 mmol/L — ABNORMAL HIGH (ref 135–145)

## 2024-02-11 LAB — GLUCOSE, CAPILLARY
Glucose-Capillary: 142 mg/dL — ABNORMAL HIGH (ref 70–99)
Glucose-Capillary: 127 mg/dL — ABNORMAL HIGH (ref 70–99)
Glucose-Capillary: 138 mg/dL — ABNORMAL HIGH (ref 70–99)
Glucose-Capillary: 113 mg/dL — ABNORMAL HIGH (ref 70–99)
Glucose-Capillary: 135 mg/dL — ABNORMAL HIGH (ref 70–99)
Glucose-Capillary: 145 mg/dL — ABNORMAL HIGH (ref 70–99)

## 2024-02-11 LAB — HEPARIN LEVEL (UNFRACTIONATED): Heparin Unfractionated: 0.39 [IU]/mL (ref 0.30–0.70)

## 2024-02-11 LAB — MAGNESIUM: Magnesium: 2.2 mg/dL (ref 1.7–2.4)

## 2024-02-11 MED ORDER — SODIUM CHLORIDE 0.9% FLUSH: 9.0000 mL | INTRAVENOUS | Status: DC | PRN

## 2024-02-11 MED ORDER — POTASSIUM CHLORIDE 10 MEQ/50ML IV SOLN
10.0000 meq | INTRAVENOUS | Status: AC
  Administered 2024-02-11 (×4): 10 meq via INTRAVENOUS
  Filled 2024-02-11 (×3): qty 50

## 2024-02-11 MED ORDER — GERHARDT'S BUTT CREAM
TOPICAL_CREAM | Freq: Three times a day (TID) | CUTANEOUS | Status: DC | PRN
Start: 2024-02-11 — End: 2024-02-16
  Filled 2024-02-11: qty 60

## 2024-02-11 MED ORDER — ORAL CARE MOUTH RINSE: 15.0000 mL | OROMUCOSAL | Status: DC | PRN

## 2024-02-11 MED ORDER — DIPHENHYDRAMINE HCL 12.5 MG/5ML PO ELIX: 12.5000 mg | ORAL_SOLUTION | Freq: Four times a day (QID) | ORAL | Status: DC | PRN

## 2024-02-11 MED ORDER — HYDROMORPHONE 1 MG/ML IV SOLN: INTRAVENOUS | Status: DC

## 2024-02-11 MED ORDER — HYDROMORPHONE 1 MG/ML IV SOLN
INTRAVENOUS | Status: DC
  Administered 2024-02-12: 1.2 mg via INTRAVENOUS
  Administered 2024-02-13: 1 mg via INTRAVENOUS
  Administered 2024-02-13: 0.4 mg via INTRAVENOUS
  Administered 2024-02-13: 1.4 mg via INTRAVENOUS
  Administered 2024-02-13: 0.6 mg via INTRAVENOUS
  Administered 2024-02-13: 1.6 mg via INTRAVENOUS
  Administered 2024-02-13: 2 mg via INTRAVENOUS
  Administered 2024-02-14: 0.8 mg via INTRAVENOUS
  Administered 2024-02-15: 0.4 mg via INTRAVENOUS
  Administered 2024-02-15: 30 mg via INTRAVENOUS
  Administered 2024-02-15: 0.8 mg via INTRAVENOUS
  Administered 2024-02-15: 1.6 mg via INTRAVENOUS
  Administered 2024-02-15 (×2): 1.2 mg via INTRAVENOUS
  Administered 2024-02-16: 1 mg via INTRAVENOUS
  Administered 2024-02-16: 1.2 mg via INTRAVENOUS
  Administered 2024-02-16: 1 mg via INTRAVENOUS
  Administered 2024-02-16: 0.4 mL via INTRAVENOUS
  Administered 2024-02-16: 1.2 mg via INTRAVENOUS
  Administered 2024-02-17: 0.8 mg via INTRAVENOUS
  Administered 2024-02-17 (×2): 1 mg via INTRAVENOUS
  Administered 2024-02-17 – 2024-02-18 (×2): 1.2 mg via INTRAVENOUS
  Administered 2024-02-18: 1.4 mg via INTRAVENOUS
  Administered 2024-02-18: 1.8 mg via INTRAVENOUS
  Administered 2024-02-19: 30 mg via INTRAVENOUS
  Administered 2024-02-19: 1.2 mg via INTRAVENOUS
  Administered 2024-02-19: 0.8 mg via INTRAVENOUS
  Administered 2024-02-19: 1.8 mg via INTRAVENOUS
  Administered 2024-02-19: 0.4 mg via INTRAVENOUS
  Filled 2024-02-11 (×2): qty 30

## 2024-02-11 MED ORDER — TRACE MINERALS CU-MN-SE-ZN 300-55-60-3000 MCG/ML IV SOLN
INTRAVENOUS | Status: AC
  Filled 2024-02-11: qty 772.8

## 2024-02-11 MED ORDER — FUROSEMIDE 10 MG/ML IJ SOLN
160.0000 mg | Freq: Three times a day (TID) | INTRAVENOUS | Status: AC
  Administered 2024-02-11 (×2): 160 mg via INTRAVENOUS
  Filled 2024-02-11 (×2): qty 16

## 2024-02-11 MED ORDER — NALOXONE HCL 0.4 MG/ML IJ SOLN: 0.4000 mg | INTRAMUSCULAR | Status: DC | PRN

## 2024-02-11 MED ORDER — HYDROMORPHONE 1 MG/ML IV SOLN
INTRAVENOUS | Status: DC
  Administered 2024-02-11: 30 mg via INTRAVENOUS
  Filled 2024-02-11: qty 30

## 2024-02-11 MED ORDER — DIPHENHYDRAMINE HCL 50 MG/ML IJ SOLN
12.5000 mg | Freq: Four times a day (QID) | INTRAMUSCULAR | Status: DC | PRN
  Administered 2024-02-12 – 2024-02-24 (×5): 12.5 mg via INTRAVENOUS
  Filled 2024-02-11 (×5): qty 1

## 2024-02-11 NOTE — Plan of Care (Signed)
  Problem: Education: Goal: Knowledge of General Education information will improve Description: Including pain rating scale, medication(s)/side effects and non-pharmacologic comfort measures Outcome: Progressing   Problem: Clinical Measurements: Goal: Ability to maintain clinical measurements within normal limits will improve Outcome: Progressing Goal: Will remain free from infection Outcome: Progressing Goal: Cardiovascular complication will be avoided Outcome: Progressing

## 2024-02-11 NOTE — Plan of Care (Signed)
  Problem: Education: Goal: Knowledge of General Education information will improve Description: Including pain rating scale, medication(s)/side effects and non-pharmacologic comfort measures Outcome: Progressing   Problem: Health Behavior/Discharge Planning: Goal: Ability to manage health-related needs will improve Outcome: Progressing   Problem: Clinical Measurements: Goal: Ability to maintain clinical measurements within normal limits will improve Outcome: Progressing Goal: Will remain free from infection Outcome: Progressing Goal: Diagnostic test results will improve Outcome: Progressing Goal: Respiratory complications will improve Outcome: Progressing Goal: Cardiovascular complication will be avoided Outcome: Progressing   Problem: Activity: Goal: Risk for activity intolerance will decrease Outcome: Progressing   Problem: Nutrition: Goal: Adequate nutrition will be maintained Outcome: Progressing   Problem: Coping: Goal: Level of anxiety will decrease Outcome: Progressing   Problem: Elimination: Goal: Will not experience complications related to bowel motility Outcome: Progressing Goal: Will not experience complications related to urinary retention Outcome: Progressing   Problem: Pain Managment: Goal: General experience of comfort will improve and/or be controlled Outcome: Progressing   Problem: Safety: Goal: Ability to remain free from injury will improve Outcome: Progressing   Problem: Skin Integrity: Goal: Risk for impaired skin integrity will decrease Outcome: Progressing   Problem: Education: Goal: Ability to describe self-care measures that may prevent or decrease complications (Diabetes Survival Skills Education) will improve Outcome: Progressing   Problem: Coping: Goal: Ability to adjust to condition or change in health will improve Outcome: Progressing   Problem: Fluid Volume: Goal: Ability to maintain a balanced intake and output will  improve Outcome: Progressing   Problem: Health Behavior/Discharge Planning: Goal: Ability to identify and utilize available resources and services will improve Outcome: Progressing Goal: Ability to manage health-related needs will improve Outcome: Progressing   Problem: Metabolic: Goal: Ability to maintain appropriate glucose levels will improve Outcome: Progressing   Problem: Nutritional: Goal: Maintenance of adequate nutrition will improve Outcome: Progressing Goal: Progress toward achieving an optimal weight will improve Outcome: Progressing   Problem: Skin Integrity: Goal: Risk for impaired skin integrity will decrease Outcome: Progressing   Problem: Tissue Perfusion: Goal: Adequacy of tissue perfusion will improve Outcome: Progressing   Problem: Activity: Goal: Ability to tolerate increased activity will improve Outcome: Progressing   Problem: Respiratory: Goal: Ability to maintain a clear airway and adequate ventilation will improve Outcome: Progressing   Problem: Role Relationship: Goal: Method of communication will improve Outcome: Progressing

## 2024-02-11 NOTE — Progress Notes (Signed)
 PHARMACY - TOTAL PARENTERAL NUTRITION CONSULT NOTE   Indication: intolerance to enteral feedings, esophageal perforation  Patient Measurements: Height: 6' 1 (185.4 cm) Weight: 79.6 kg (175 lb 7.8 oz) IBW/kg (Calculated) : 79.9 TPN AdjBW (KG): 80.6 Body mass index is 23.15 kg/m.  Assessment:  73 year old man with medical history significant for cardiac arrest earlier this year who was admitted after several days of nausea and vomiting followed by sudden onset severe tearing epigastric pain radiating to back and chest. 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 Pharmacy consulted to initiate TPN due to inability to tolerate enteral nutrition at this time.  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 PTA takes torsemide  20 mg PO daily, now held. Aggressive fluid resuscitation recommended per GI. Per HF, patient currently euvolemic but will hold diuretic for now. Due to CHF will target lower end of fluid range for TPN and supplement additional fluids per MD outside of TPN to prevent fluid overload.   Stent placed on 10/6, esophogram not showing any leak. Diet advanced to CLD. 10/15 Leak present and made NPO. 10/24 esophagram with persistent leak; will need stent replacement in 3-4 weeks.   Glucose / Insulin : A1C 6.0% (02/2023);  BG <160, 13 units insulin  in TPN  Electrolytes: Na 147 up (none in TPN), K 4 (Received 60 mEq IV), CoCa 10.7 (none in TPN),  Mg 2.2 (none in TPN), others wnl Renal: Scr 1.51 down (BL ~2), BUN 82 down Hepatic: AST/ALT/tbili wnl, Alk Phos 189, alb <1.5, TG 49 Intake / Output; MIVF: furosemide  160mg  x2; UOP 3.6 mL/kg/hr, chest tube 90 ml, LBM 10/23 Net IO Since Admission: 5,418.7 mL [02/11/24 0711]  - weight up 9 lbs since admit  GI Imaging: 10/05 CT: concerning for esophageal perforation, masslike area of soft tissue thickening adjacent to and surrounding the esophagus  10/06 Esoph XR: distal esophageal perforation 10/7  Esoph XR: No leak associated with stent coverage of esophageal perforation 10/14 KUB: Gas-filled loops of small bowel and colon suggestive of ileus 10/15 Esoph XR: esophageal perforation/leak at the distal esophagus, proximal to the esophageal stent 10/24 esophagram: persistent leak despite being covered by the esophageal stent.  GI Surgeries / Procedures:  10/6: esophageal stent placement  10/15 EGD and stent repositioning   Central access: 01/22/24 TPN start date: 01/22/24  Nutritional Goals:  Goal concentrated TPN is 70 ml/hr (provides 116 g of protein and 2301 kcals per day  RD Assessment:  Estimated Needs Total Energy Estimated Needs: 2300-2500 kcals Total Protein Estimated Needs: 115-130 g Total Fluid Estimated Needs: 2L  Current Nutrition:  TPN 10/13- 10/14 DYS1: not taking any po  10/15: NPO d/t increased abd distension  10/22 Propofol  at 7.6- 20.2 ml/hr, providing ~370 kcals/24hr 10/23: switch propofol  back to dexmedetomidine   Plan:  Continue concentrated TPN at goal 70 mL/hr, providing 100% estimated nutritional needs   Electrolytes in TPN: Na 0 mEq/L, K 30 mEq/L, Ca 0 mEq/L, Mg 0 mEq/L, Phos 10 mmol/L. Cl:Ac max acetate (cannot do 1:1 or 1:2 due to no Na in bag ) Continue standard MVI and trace elements to TPN Add 13 units insulin  regular to TPN, stop BG checks (getting daily labs) Monitor TPN labs on Mon/Thurs, and PRN    Thank you for involving pharmacy in this patient's care.  Jinnie Door, PharmD, BCPS, BCCP Clinical Pharmacist  Please check AMION for all [facility] phone numbers After 10:00 PM, call Main Pharmacy [phone]
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
## 2024-02-11 NOTE — Progress Notes (Addendum)
 Advanced Heart Failure Rounding Note  Cardiologist: Dorn Lesches, MD  Chief Complaint: S/P esophageal stent Subjective:    - S/p EGD with stent repositioning by Dr. Shyrl 10/15.  - Limited echo 02/08/24 EF 60-65%  - Barium swallow 02/09/24 persistent leak   Remains on TPN. Complaining of chest/throat pain -> started on PCA pump  Remains on lIV lasix  160 bid  Negative 3.4L Weight down 7 pounds over night. Scr stable at 1.5 K 4.0  Remains in NSR on IV amio    Objective:    Weight Range: 79.6 kg Body mass index is 23.15 kg/m.   Vital Signs:   Temp:  [97.8 F (36.6 C)-98.9 F (37.2 C)] 98.5 F (36.9 C) (10/26 0715) Pulse Rate:  [53-65] 62 (10/26 1000) Resp:  [22-37] 32 (10/26 1000) BP: (112-157)/(44-75) 145/65 (10/26 1000) SpO2:  [91 %-98 %] 93 % (10/26 1000) Last BM Date : 02/09/24  Weight change: Filed Weights   02/07/24 0500 02/08/24 0500 02/09/24 0401  Weight: 84.3 kg 82.7 kg 79.6 kg   Intake/Output:  Intake/Output Summary (Last 24 hours) at 02/11/2024 1104 Last data filed at 02/11/2024 1000 Gross per 24 hour  Intake 2897.98 ml  Output 6290 ml  Net -3392.02 ml    Physical Exam   General:  Weak appearing. Lethargic No resp difficulty HEENT: normal Neck: supple. no JVD.  Cor: Regular rate & rhythm. No rubs, gallops or murmurs. Lungs: clear Abdomen: soft, nontender, nondistended. No hepatosplenomegaly. No bruits or masses. Good bowel sounds. Extremities: no cyanosis, clubbing, rash, 1-2+ edema Neuro: alert & orientedx3, cranial nerves grossly intact. moves all 4 extremities w/o difficulty. Affect pleasant   Telemetry   VP 60 Personally reviewed  Labs    CBC Recent Labs    02/09/24 0411  WBC 11.4*  HGB 9.1*  HCT 29.1*  MCV 87.7  PLT 416*   Basic Metabolic Panel Recent Labs    89/74/74 0500 02/11/24 0953  NA 146* 147*  K 3.7 4.0  CL 106 107  CO2 29 28  GLUCOSE 154* 150*  BUN 96* 82*  CREATININE 1.55* 1.51*  CALCIUM  8.6*  8.7*  MG 2.2 2.2  PHOS 2.9 3.3   Liver Function Tests Recent Labs    02/10/24 0500 02/11/24 0953  ALBUMIN  <1.5* <1.5*   BNP (last 3 results) Recent Labs    03/09/23 1544 03/11/23 0218 06/27/23 1116  BNP 660.9* 491.3* 646.4*   Hemoglobin A1C No results for input(s): HGBA1C in the last 72 hours.  Fasting Lipid Panel Recent Labs    02/08/24 1139  TRIG 49   Medications:    Scheduled Medications:  bisacodyl   10 mg Rectal Q0600   Chlorhexidine  Gluconate Cloth  6 each Topical Daily   HYDROmorphone    Intravenous Q4H   ipratropium-albuterol   3 mL Nebulization Q6H   lidocaine   1 patch Transdermal Q24H   mouth rinse  15 mL Mouth Rinse TID   pantoprazole  (PROTONIX ) IV  40 mg Intravenous Q12H   sodium chloride  flush  10 mL Intrapleural Q8H   sodium chloride  flush  10-40 mL Intracatheter Q12H    Infusions:  amiodarone  30 mg/hr (02/11/24 1000)   dexmedetomidine  (PRECEDEX ) IV infusion 0.2 mcg/kg/hr (02/11/24 1000)   fluconazole (DIFLUCAN) IV 200 mg (02/11/24 1031)   heparin  1,950 Units/hr (02/11/24 1000)   meropenem (MERREM) IV 200 mL/hr at 02/11/24 1000   norepinephrine  (LEVOPHED ) Adult infusion Stopped (02/08/24 0745)   TPN ADULT (ION) 70 mL/hr at 02/11/24 1000   TPN  ADULT (ION)      PRN Medications: diphenhydrAMINE **OR** diphenhydrAMINE, [name] butt cream, hydrALAZINE , levalbuterol, LORazepam , naloxone  **AND** sodium chloride  flush, ondansetron  (ZOFRAN ) IV, mouth rinse, sodium chloride  flush  Patient Profile    Patient with longstanding history of chronic heart failure with preserved ejection fraction, RV failure with history of pulmonary embolism presents with esophageal perforation.  S/P Stent Esophageal Stent. Stent repositioned 10/15.  Assessment/Plan   1.  Esophageal Perforation - Boerhaave syndrome.  - CT C/A/P: with pneumomediastinum and large mass-like density in lower esophagus - 10/25 S/P EGD with esophageal stent placed  - Stent migrated w/  leak>back to OR 10/15 for repositioning  - Barium swallow 10/24 persistent leak  - On TPN; Strict NPO - Continues on fluconazole; abx escalated d/t PNA, now on Vanc + Meropenum   => CCM/TCTS  managing.   2. Acute on chronic HFpEF: h/o cardiogenic shock with RV failure in the setting of cardiac arrest thought to be caused by acute PE vs ACS in 3/25. Echo at the time showed EF 55%, severe, LVH, G2DD, and severely reduced RV function with strain. There was concern for cardiac sarcoid amyloid, however CMR LGE consistent with ischemic disease. RV on echo 9/25 with complete recovery, EF 60-65%, nl RV function.    - RHC 3/25: Mild PAH with severe RV failure though CO is preserved.   - Repeat echo 10/24 EF 60-65% - Marked fluid overload with 3rd spacing - Continue IV lasix  - Place TED hose  3. 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 Medical management.  - suspect CP related to esophageal issues and not CAD - strict NPO, start atova when taking PO   4.  AKI on CKD Stage 3b: Baseline sCr ~ 2.1. - Creatinine peaked to 2.6. SCr stable 1.5 today w/ diuresis  - continue IV Lasix  for renal venous decongestion    5. PAF:   - s/p TEE/DCCV 3/25 - has converted to AF a couple times this admit - in NSR on tele   - continue IV amio (NPO) and heparin   6.  H/o DVT/PE:  Bilateral upper and lower DVTs, mixed acute and chronic.  - s/p infrarenal IVC placement 3/25 - V/Q 3/25 with multiple perfusion defects - continue IV heparin . Will need lifelong AC, transition to Eliquis  once taking PO - no change   7. Carotid stenosis: h/o CVA. TCAR in 6/24. Okay to remain off plavix  at this point. - carotid US  9/25: widely patent carotid stent on R and no stenosis on L   8. Acute Hypoxic Respiratory Failure/PNA/ Exudative Lt Pleural Effusion  - on Vanc + meropenum for aspiration PNA  - CT placed for pleural effusion. Respiratory Cx w/ rare yeast, BCx  NGTD  - improved. Now on RA  9. Severe protein calorie malnutrition - Nutrition following. On TPN   Toribio Fuel, MD 02/11/2024  Advanced Heart Failure Team Pager [phone] (M-F; 7a - 5p)  Please contact [facility] for night-coverage after hours (4p -7a ) and weekends on [email or web address]

## 2024-02-11 NOTE — Progress Notes (Signed)
 PHARMACY - ANTICOAGULATION CONSULT NOTE  Pharmacy Consult for heparin  infusion Indication: atrial fibrillation  Allergies  Allergen Reactions   Norvasc  [Amlodipine ] Swelling and Other (See Comments)    Excessive BLE swelling Skin discoloration, blotching   Jardiance  [Empagliflozin ] Itching and Other (See Comments)    Patient mentioned penile swelling, pain   Morphine And Codeine Itching    Opioid-induced pruritus    Patient Measurements: Height: 6' 1 (185.4 cm) Weight: 79.6 kg (175 lb 7.8 oz) IBW/kg (Calculated) : 79.9 HEPARIN  DW (KG): 80.6  Vital Signs: Temp: 98.9 F (37.2 C) (10/26 1100) Temp Source: Axillary (10/26 1100) BP: 184/77 (10/26 1520) Pulse Rate: 60 (10/26 1100)  Labs: Recent Labs    02/09/24 0410 02/09/24 0411 02/09/24 1453 02/10/24 0500 02/11/24 0953 02/11/24 1110  HGB  --  9.1*  --   --   --  9.3*  HCT  --  29.1*  --   --   --  29.8*  PLT  --  416*  --   --   --  347  HEPARINUNFRC 0.28*  --  0.33 0.49  --  0.39  CREATININE 1.91*  --   --  1.55* 1.51*  --     Estimated Creatinine Clearance: 50.5 mL/min (A) (by C-G formula based on SCr of 1.51 mg/dL (H)).   Assessment: 73 yo M presenting for esophageal rupture, now s/p esophageal stent placement on 10/6. PMH significant for HFrEF, Afib on Eliquis  PTA. Last dose of Eliquis  01/20/24, time unknown. Pharmacy consulted for heparin  management.  10/15 - underwent EGD with stent reposition on 10/15 after finding esophageal leak  02/10/24 AM: Heparin  level 0.39, therapeutic at heparin  1950 units/hr. No issues with infusion running or signs of bleeding per RN. CBC stable (Hgb 9.3, PLT 347).  Goal of Therapy:  Heparin  level 0.3-0.5 units/ml Monitor platelets by anticoagulation protocol: Yes   Plan:  Continue IV heparin  to 1950 units/hr Monitor heparin  level daily Monitor CBC and s/sx of bleeding daily  Morna Breach, PharmD PGY2 Cardiology Pharmacy Resident 02/11/2024 3:49 PM

## 2024-02-12 DIAGNOSIS — I5033 Acute on chronic diastolic (congestive) heart failure: Secondary | ICD-10-CM | POA: Diagnosis not present

## 2024-02-12 DIAGNOSIS — J9602 Acute respiratory failure with hypercapnia: Secondary | ICD-10-CM | POA: Diagnosis not present

## 2024-02-12 DIAGNOSIS — K223 Perforation of esophagus: Secondary | ICD-10-CM | POA: Diagnosis not present

## 2024-02-12 DIAGNOSIS — J90 Pleural effusion, not elsewhere classified: Secondary | ICD-10-CM | POA: Diagnosis not present

## 2024-02-12 DIAGNOSIS — J9601 Acute respiratory failure with hypoxia: Secondary | ICD-10-CM | POA: Diagnosis not present

## 2024-02-12 LAB — PHOSPHORUS: Phosphorus: 4.2 mg/dL (ref 2.5–4.6)

## 2024-02-12 LAB — COMPREHENSIVE METABOLIC PANEL WITH GFR
ALT: 20 U/L (ref 0–44)
AST: 17 U/L (ref 15–41)
Albumin: 1.5 g/dL — ABNORMAL LOW (ref 3.5–5.0)
Alkaline Phosphatase: 95 U/L (ref 38–126)
Anion gap: 10 (ref 5–15)
BUN: 83 mg/dL — ABNORMAL HIGH (ref 8–23)
CO2: 30 mmol/L (ref 22–32)
Calcium: 9.3 mg/dL (ref 8.9–10.3)
Chloride: 107 mmol/L (ref 98–111)
Creatinine, Ser: 1.67 mg/dL — ABNORMAL HIGH (ref 0.61–1.24)
GFR, Estimated: 43 mL/min — ABNORMAL LOW (ref >60)
Glucose, Bld: 137 mg/dL — ABNORMAL HIGH (ref 70–99)
Potassium: 3.9 mmol/L (ref 3.5–5.1)
Sodium: 147 mmol/L — ABNORMAL HIGH (ref 135–145)
Total Bilirubin: 0.5 mg/dL (ref 0.0–1.2)
Total Protein: 6.4 g/dL — ABNORMAL LOW (ref 6.5–8.1)

## 2024-02-12 LAB — GLUCOSE, CAPILLARY
Glucose-Capillary: 130 mg/dL — ABNORMAL HIGH (ref 70–99)
Glucose-Capillary: 131 mg/dL — ABNORMAL HIGH (ref 70–99)
Glucose-Capillary: 130 mg/dL — ABNORMAL HIGH (ref 70–99)
Glucose-Capillary: 137 mg/dL — ABNORMAL HIGH (ref 70–99)

## 2024-02-12 LAB — RENAL FUNCTION PANEL
Albumin: 1.5 g/dL — ABNORMAL LOW (ref 3.5–5.0)
Anion gap: 12 (ref 5–15)
BUN: 83 mg/dL — ABNORMAL HIGH (ref 8–23)
CO2: 29 mmol/L (ref 22–32)
Calcium: 9.2 mg/dL (ref 8.9–10.3)
Chloride: 106 mmol/L (ref 98–111)
Creatinine, Ser: 1.66 mg/dL — ABNORMAL HIGH (ref 0.61–1.24)
GFR, Estimated: 44 mL/min — ABNORMAL LOW (ref >60)
Glucose, Bld: 136 mg/dL — ABNORMAL HIGH (ref 70–99)
Phosphorus: 4.3 mg/dL (ref 2.5–4.6)
Potassium: 3.9 mmol/L (ref 3.5–5.1)
Sodium: 147 mmol/L — ABNORMAL HIGH (ref 135–145)

## 2024-02-12 LAB — CBC
HCT: 31.3 % — ABNORMAL LOW (ref 39.0–52.0)
Hemoglobin: 9.8 g/dL — ABNORMAL LOW (ref 13.0–17.0)
MCH: 27.7 pg (ref 26.0–34.0)
MCHC: 31.3 g/dL (ref 30.0–36.0)
MCV: 88.4 fL (ref 80.0–100.0)
Platelets: 338 K/uL (ref 150–400)
RBC: 3.54 MIL/uL — ABNORMAL LOW (ref 4.22–5.81)
RDW: 18.1 % — ABNORMAL HIGH (ref 11.5–15.5)
WBC: 11.9 K/uL — ABNORMAL HIGH (ref 4.0–10.5)
nRBC: 0 % (ref 0.0–0.2)

## 2024-02-12 LAB — HEPARIN LEVEL (UNFRACTIONATED): Heparin Unfractionated: 0.5 [IU]/mL (ref 0.30–0.70)

## 2024-02-12 LAB — MAGNESIUM: Magnesium: 2.2 mg/dL (ref 1.7–2.4)

## 2024-02-12 LAB — TRIGLYCERIDES: Triglycerides: 41 mg/dL (ref <150)

## 2024-02-12 MED ORDER — TRACE MINERALS CU-MN-SE-ZN 300-55-60-3000 MCG/ML IV SOLN
INTRAVENOUS | Status: AC
  Filled 2024-02-12 (×2): qty 772.8

## 2024-02-12 MED ORDER — FUROSEMIDE 10 MG/ML IJ SOLN
160.0000 mg | Freq: Once | INTRAVENOUS | Status: AC
  Administered 2024-02-12: 160 mg via INTRAVENOUS
  Filled 2024-02-12: qty 16

## 2024-02-12 NOTE — Progress Notes (Addendum)
 Advanced Heart Failure Rounding Note  Cardiologist: Dorn Lesches, MD  Chief Complaint: S/p esophageal stent Subjective:    - S/p EGD with stent repositioning by Dr. Shyrl 10/15.  - Limited echo 02/08/24 EF 60-65%  - Barium swallow 02/09/24 persistent leak   4L UOP (net +500cc with TPN), but weight down another 7lbs (appears inaccurate). sCr 1.51>1.66 On TPN. Persistent pain.  Remains in NSR  Sitting up in bed. Working with PT, remains with some pain. Volume improving.   Objective:    Weight Range: 71.6 kg Body mass index is 20.83 kg/m.   Vital Signs:   Temp:  [97.7 F (36.5 C)-98.9 F (37.2 C)] 97.7 F (36.5 C) (10/27 0727) Pulse Rate:  [52-78] 54 (10/27 0828) Resp:  [20-37] 21 (10/27 0828) BP: (97-199)/(49-135) 121/51 (10/27 0828) SpO2:  [92 %-98 %] 96 % (10/27 0828) FiO2 (%):  [40 %] 40 % (10/27 0802) Weight:  [71.6 kg] 71.6 kg (10/27 0500) Last BM Date : 02/11/24  Weight change: Filed Weights   02/08/24 0500 02/09/24 0401 02/12/24 0500  Weight: 82.7 kg 79.6 kg 71.6 kg   Intake/Output:  Intake/Output Summary (Last 24 hours) at 02/12/2024 0923 Last data filed at 02/12/2024 0900 Gross per 24 hour  Intake 3261.56 ml  Output 4050 ml  Net -788.44 ml    Physical Exam   General: Thin appearing. No distress on [place] [facility]: JVP flat. S1 and S2 present. No murmurs Extremities: Warm and dry.  Trace generalized edema. + unna boots Neuro: Alert and oriented x3. Dazed  Telemetry   SB 50-60s (personally reviewed)  Labs    CBC Recent Labs    02/11/24 1110 02/12/24 0432  WBC 12.7* 11.9*  HGB 9.3* 9.8*  HCT 29.8* 31.3*  MCV 88.2 88.4  PLT 347 338   Basic Metabolic Panel Recent Labs    89/73/74 0953 02/12/24 0432  NA 147* 147*  147*  K 4.0 3.9  3.9  CL 107 107  106  CO2 28 30  29   GLUCOSE 150* 137*  136*  BUN 82* 83*  83*  CREATININE 1.51* 1.67*  1.66*  CALCIUM  8.7* 9.3  9.2  MG 2.2 2.2  PHOS 3.3 4.2  4.3   Liver Function  Tests Recent Labs    02/11/24 0953 02/12/24 0432  AST  --  17  ALT  --  20  ALKPHOS  --  95  BILITOT  --  0.5  PROT  --  6.4*  ALBUMIN  <1.5* 1.5*  1.5*   BNP (last 3 results) Recent Labs    03/09/23 1544 03/11/23 0218 06/27/23 1116  BNP 660.9* 491.3* 646.4*   Hemoglobin A1C No results for input(s): HGBA1C in the last 72 hours.  Fasting Lipid Panel Recent Labs    02/12/24 0432  TRIG 41   Medications:    Scheduled Medications:  bisacodyl   10 mg Rectal Q0600   Chlorhexidine  Gluconate Cloth  6 each Topical Daily   HYDROmorphone    Intravenous Q4H   ipratropium-albuterol   3 mL Nebulization Q6H   lidocaine   1 patch Transdermal Q24H   pantoprazole  (PROTONIX ) IV  40 mg Intravenous Q12H   sodium chloride  flush  10 mL Intrapleural Q8H   sodium chloride  flush  10-40 mL Intracatheter Q12H    Infusions:  amiodarone  30 mg/hr (02/12/24 0900)   dexmedetomidine  (PRECEDEX ) IV infusion 0.3 mcg/kg/hr (02/12/24 0900)   fluconazole (DIFLUCAN) IV Stopped (02/11/24 1135)   heparin  1,950 Units/hr (02/12/24 0900)   meropenem (MERREM) IV  1 g (02/12/24 9081)   norepinephrine  (LEVOPHED ) Adult infusion Stopped (02/08/24 0745)   TPN ADULT (ION) 70 mL/hr at 02/12/24 0900    PRN Medications: diphenhydrAMINE **OR** diphenhydrAMINE, [name] butt cream, hydrALAZINE , levalbuterol, LORazepam , naloxone  **AND** sodium chloride  flush, ondansetron  (ZOFRAN ) IV, mouth rinse, sodium chloride  flush  Patient Profile    Patient with longstanding history of chronic heart failure with preserved ejection fraction, RV failure with history of pulmonary embolism presents with esophageal perforation.  S/P Stent Esophageal Stent. Stent repositioned 10/15.  Assessment/Plan   1.  Esophageal Perforation - Boerhaave syndrome.  - CT C/A/P: with pneumomediastinum and large mass-like density in lower esophagus - 10/25 S/P EGD with esophageal stent placed  - Stent migrated w/ leak>back to OR 10/15 for  repositioning  - Barium swallow 10/24 persistent leak  - On TPN; Strict NPO - Continues on fluconazole; abx escalated d/t PNA, now on Vanc + Meropenum   => CCM/TCTS  managing.   2. Acute on chronic HFpEF: h/o cardiogenic shock with RV failure in the setting of cardiac arrest thought to be caused by acute PE vs ACS in 3/25. Echo at the time showed EF 55%, severe, LVH, G2DD, and severely reduced RV function with strain. There was concern for cardiac sarcoid amyloid, however CMR LGE consistent with ischemic disease. RV on echo 9/25 with complete recovery, EF 60-65%, nl RV function.    - RHC 3/25: Mild PAH with severe RV failure though CO is preserved.   - Repeat echo 10/24 EF 60-65% - appears euvolemic - continue lasix  160 mg daily with TPN to keep even - Continue TED hose  3. 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 Medical management.  - suspect CP related to esophageal issues and not CAD - strict NPO, holding PO meds   4.  AKI on CKD Stage 3b: Baseline sCr ~ 2.1. - Creatinine peaked to 2.6. SCr stable 1.6 today w/ diuresis  - continue IV Lasix  for renal venous decongestion    5. PAF:   - s/p TEE/DCCV 3/25 - has converted to AF a couple times this admit - in NSR on tele   - continue IV amio (NPO) and heparin   6.  H/o DVT/PE:  Bilateral upper and lower DVTs, mixed acute and chronic.  - s/p infrarenal IVC placement 3/25 - V/Q 3/25 with multiple perfusion defects - continue IV heparin . Will need lifelong AC, transition to Eliquis  once taking PO - no change   7. Carotid stenosis: h/o CVA. TCAR in 6/24. Okay to remain off plavix  at this point. - carotid US  9/25: widely patent carotid stent on R and no stenosis on L   8. Acute Hypoxic Respiratory Failure/PNA/ Exudative Lt Pleural Effusion  - on Vanc + meropenum for aspiration PNA  - CT placed for pleural effusion. Respiratory Cx w/ rare yeast, BCx NGTD  - improved. Now  on RA  9. Severe protein calorie malnutrition - Nutrition following. On TPN   Jordan Lee, NP 02/12/2024  Advanced Heart Failure Team Pager [phone] (M-F; 7a - 5p)  Please contact [facility] for night-coverage after hours (4p -7a ) and weekends on [email or web address]  Patient seen and examined with the above-signed Advanced Practice Provider and/or Housestaff. I personally reviewed laboratory data, imaging studies and relevant notes. I independently examined the patient and formulated the important aspects of the plan. I have edited the note to reflect any of my changes or salient  points. I have personally discussed the plan with the patient and/or family.  Remains very weak. NPO on TPN.  Volume status continues to improve with IV lasix . Remains in NSR on IV amio and heparin    Working with PT  General:  Sitting up in bed. No resp difficulty HEENT: normal Neck: supple. no JVD.  Cor: Regular rate & rhythm. No rubs, gallops or murmurs. Lungs: clear Abdomen: soft, nontender, nondistended.Good bowel sounds. Extremities: no cyanosis, clubbing, rash, edema + stockings Neuro: alert & orientedx3, cranial nerves grossly intact. Affect pleasant  He remains very weak.   Still unable to take po.   Volume status back to baseline. Continue lasix  160 IV daily to keep even while on TPN. Continue IV amio and heparin .   Areesha Dehaven, MD  11:57 AM

## 2024-02-12 NOTE — Progress Notes (Addendum)
 PHARMACY - ANTICOAGULATION CONSULT NOTE  Pharmacy Consult for heparin  infusion Indication: atrial fibrillation  Allergies  Allergen Reactions   Norvasc  [Amlodipine ] Swelling and Other (See Comments)    Excessive BLE swelling Skin discoloration, blotching   Jardiance  [Empagliflozin ] Itching and Other (See Comments)    Patient mentioned penile swelling, pain   Morphine And Codeine Itching    Opioid-induced pruritus    Patient Measurements: Height: 6' 1 (185.4 cm) Weight: 71.6 kg (157 lb 13.6 oz) IBW/kg (Calculated) : 79.9 HEPARIN  DW (KG): 80.6  Vital Signs: Temp: 98.5 F (36.9 C) (10/27 1121) Temp Source: Axillary (10/27 1121) BP: 121/51 (10/27 0828) Pulse Rate: 54 (10/27 0828)  Labs: Recent Labs    02/10/24 0500 02/11/24 0953 02/11/24 1110 02/12/24 0432  HGB  --   --  9.3* 9.8*  HCT  --   --  29.8* 31.3*  PLT  --   --  347 338  HEPARINUNFRC 0.49  --  0.39 0.50  CREATININE 1.55* 1.51*  --  1.67*  1.66*    Estimated Creatinine Clearance: 41.1 mL/min (A) (by C-G formula based on SCr of 1.67 mg/dL (H)).   Assessment: 73 yo M presenting for esophageal rupture, now s/p esophageal stent placement on 10/6. PMH significant for HFrEF, Afib on Eliquis  PTA. Last dose of Eliquis  01/20/24, time unknown. Pharmacy consulted for heparin  management.  10/15 - underwent EGD with stent reposition on 10/15 after finding esophageal leak  Heparin  level this AM 0.5, therapeutic at heparin  1950 units/hr. No issues with infusion running or signs of bleeding per RN. CBC stable (Hgb 9.8, PLT 338).  Pt remains NPO  Goal of Therapy:  Heparin  level 0.3-0.5 units/ml Monitor platelets by anticoagulation protocol: Yes   Plan:  Continue IV heparin  to 1950 units/hr Monitor heparin  level daily Monitor CBC and s/sx of bleeding daily  Harlene Barlow, [employer], [facility] Clinical Pharmacist  02/12/2024 2:14 PM   [facility] pharmacy phone numbers are listed on [email or web address]
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
## 2024-02-12 NOTE — Progress Notes (Signed)
 Physical Therapy Treatment Patient Details Name: Billy [name] MRN: [id number] DOB: 10/27/1950 Today's Date: 02/12/2024   History of Present Illness Billy [name] is a 73 y.o. male who presented to [facility] ED 01/21/24 with several days of vomiting followed by an episode of severe tearing epigastric pain. Work-up revealed esophageal tear. Pt s/p esophageal stent with EGD 10/7. Return to OR for esophageal stent adjustment on 10/15, remained intubated after procedure due to suspected aspiration. Extubated 10/16.  Pt had CT placed 10/16. Reintubated 10/20-10/24. PMHx: HFrEF, AFib, CAD, PE complicated by cardiogenic shock, CKD 4, MI, peripheral neuropathy, and TIA.    PT Comments  Pt admitted with above diagnosis. Pt was able to stand and pivot to 3N1 with +2 mod assist and heavy use of UEs.  Fatigues therefore needed Stedy to complete session and get pt into chair.  Pt with 2 chest tubes and multiple lines making movement a challenge as well as pt appeared frustrated much of the time due to pain and weakness.  Recommend pt have post acute rehab < 3 hours day. Has had multiple medical setbacks.  Pt currently with functional limitations due to the deficits listed below (see PT Problem List). Pt will benefit from acute skilled PT to increase their independence and safety with mobility to allow discharge.       If plan is discharge home, recommend the following: Assistance with cooking/housework;Assist for transportation;Help with stairs or ramp for entrance;A lot of help with bathing/dressing/bathroom;Direct supervision/assist for medications management;Direct supervision/assist for financial management;Supervision due to cognitive status;A lot of help with walking and/or transfers   Can travel by private vehicle        Equipment Recommendations  Wheelchair (measurements PT);Wheelchair cushion (measurements PT)    Recommendations for Other Services       Precautions / Restrictions Precautions Precautions:  Fall Recall of Precautions/Restrictions: Impaired Precaution/Restrictions Comments: BIL chest tubes in place; Restrictions Weight Bearing Restrictions Per Provider Order: No     Mobility  Bed Mobility Overal bed mobility: Needs Assistance Bed Mobility: Rolling, Sidelying to Sit, Sit to Sidelying Rolling: Min assist   Supine to sit: Mod assist, +2 for physical assistance, HOB elevated, Used rails     General bed mobility comments: Mod assist for trunk to come to sitting. +2 assist    Transfers Overall transfer level: Needs assistance Equipment used: 2 person hand held assist, Ambulation equipment used Transfers: Sit to/from Stand, Bed to chair/wheelchair/BSC Sit to Stand: Mod assist, +2 physical assistance, +2 safety/equipment   Step pivot transfers: Mod assist, +2 physical assistance, From elevated surface       General transfer comment: Mod assist +2 to come to stand. Pt with flexed posture, able to complete lateral steps to 3N1 to have BM initially. Once finished, pt was fatiguing therefore obtained Stedy and used it to have pt stand to it to be cleaned with same assist due to fatigue.  Then pt sat on buttock pads and was moved to the recliner. Stood once more with min assist of 2 and sat in chair. Transfer via Lift Equipment: Stedy  Ambulation/Gait                   Stairs             Wheelchair Mobility     Tilt Bed    Modified Rankin (Stroke Patients Only)       Balance Overall balance assessment: Needs assistance Sitting-balance support: Feet supported, Bilateral upper extremity supported Sitting balance-Leahy Scale: Poor  Sitting balance - Comments: CGA EOB UE support   Standing balance support: During functional activity, Bilateral upper extremity supported Standing balance-Leahy Scale: Poor Standing balance comment: BIL UE support and external support                            Communication Communication Communication:  Impaired Factors Affecting Communication: Difficulty expressing self  Cognition Arousal: Alert Behavior During Therapy: Flat affect   PT - Cognitive impairments: Attention, Sequencing, Problem solving                       PT - Cognition Comments: Delayed processing, doesn't consistently respond to questions Following commands: Impaired Following commands impaired: Follows one step commands with increased time, Follows one step commands inconsistently    Cueing Cueing Techniques: Verbal cues, Gestural cues, Tactile cues  Exercises General Exercises - Lower Extremity Ankle Circles/Pumps: AROM, Both, 10 reps Quad Sets: Strengthening, Both, 10 reps, Supine Long Arc Quad: AROM, Both, 10 reps, Seated Hip Flexion/Marching: Both, 10 reps, Seated, AAROM Other Exercises Other Exercises: x10 incentive spirometer pulls.    General Comments General comments (skin integrity, edema, etc.): VSS on 2LO2 with slight desaturation with activity on 2L therefore incr to 4L to keep sats >88%.      Pertinent Vitals/Pain Pain Assessment Pain Assessment: Faces Faces Pain Scale: Hurts little more Pain Location: Abdomen Pain Descriptors / Indicators: Grimacing, Guarding Pain Intervention(s): Limited activity within patient's tolerance, Monitored during session, Repositioned    Home Living                          Prior Function            PT Goals (current goals can now be found in the care plan section) Acute Rehab PT Goals Patient Stated Goal: Return Home Progress towards PT goals: Progressing toward goals    Frequency    Min 2X/week      PT Plan      Co-evaluation              AM-PAC PT 6 Clicks Mobility   Outcome Measure  Help needed turning from your back to your side while in a flat bed without using bedrails?: A Little Help needed moving from lying on your back to sitting on the side of a flat bed without using bedrails?: Total Help needed moving  to and from a bed to a chair (including a wheelchair)?: Total Help needed standing up from a chair using your arms (e.g., wheelchair or bedside chair)?: Total Help needed to walk in hospital room?: Total Help needed climbing 3-5 steps with a railing? : Total 6 Click Score: 8    End of Session Equipment Utilized During Treatment: Oxygen  Activity Tolerance: Patient limited by fatigue Patient left: with call bell/phone within reach;in chair;with chair alarm set Nurse Communication: Mobility status;Need for lift equipment Laurent) PT Visit Diagnosis: Difficulty in walking, not elsewhere classified (R26.2);Unsteadiness on feet (R26.81);Other abnormalities of gait and mobility (R26.89);Muscle weakness (generalized) (M62.81);Other symptoms and signs involving the nervous system (R29.898);Pain Pain - part of body:  (abdomen)     Time: 9068-8975 PT Time Calculation (min) (ACUTE ONLY): 53 min  Charges:    $Therapeutic Exercise: 8-22 mins $Therapeutic Activity: 23-37 mins $Self Care/Home Management: 8-22 PT General Charges $$ ACUTE PT VISIT: 1 Visit  [facility] M,PT Acute Rehab Services [phone]    Stephane JULIANNA Bevel 02/12/2024, 1:30 PM

## 2024-02-12 NOTE — Progress Notes (Signed)
 PHARMACY - TOTAL PARENTERAL NUTRITION CONSULT NOTE   Indication: intolerance to enteral feedings, esophageal perforation  Patient Measurements: Height: 6' 1 (185.4 cm) Weight: 71.6 kg (157 lb 13.6 oz) IBW/kg (Calculated) : 79.9 TPN AdjBW (KG): 80.6 Body mass index is 20.83 kg/m.  Assessment:  73 year old man with medical history significant for cardiac arrest earlier this year who was admitted after several days of nausea and vomiting followed by sudden onset severe tearing epigastric pain radiating to back and chest. 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 Pharmacy consulted to initiate TPN due to inability to tolerate enteral nutrition at this time.  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 PTA takes torsemide  20 mg PO daily, now held. Aggressive fluid resuscitation recommended per GI. Per HF, patient currently euvolemic but will hold diuretic for now. Due to CHF will target lower end of fluid range for TPN and supplement additional fluids per MD outside of TPN to prevent fluid overload.   Stent placed on 10/6, esophogram not showing any leak. Diet advanced to CLD. 10/15 Leak present and made NPO. 10/24 esophagram with persistent leak; will need stent replacement in 3-4 weeks.   Glucose / Insulin : A1C 6.0% (02/2023);  BG 113-145, 13 units insulin  in TPN  Electrolytes: Na 147 (none in TPN), K 3.9 (Received 40 mEq IV), CoCa up 11.3 (none in TPN),  Mg 2.2 (none in TPN), others wnl Renal: Scr up 1.67 (BL ~2), BUN 83 stable Hepatic: AST/ALT/tbili wnl, Alk Phos 189, alb <1.5, TG 41 Intake / Output; MIVF: furosemide  160mg  x2 on 10/26, continuing furosemide  160mg  x1 today; UOP 2.3 mL/kg/hr, chest tube 100 ml, LBM 10/23 Net IO Since Admission: 5,940.58 mL [02/12/24 0834]  - weight up 9 lbs since admit  GI Imaging: 10/05 CT: concerning for esophageal perforation, masslike area of soft tissue thickening adjacent to and surrounding the esophagus   10/06 Esoph XR: distal esophageal perforation 10/7 Esoph XR: No leak associated with stent coverage of esophageal perforation 10/14 KUB: Gas-filled loops of small bowel and colon suggestive of ileus 10/15 Esoph XR: esophageal perforation/leak at the distal esophagus, proximal to the esophageal stent 10/24 esophagram: persistent leak despite being covered by the esophageal stent.  GI Surgeries / Procedures:  10/6: esophageal stent placement  10/15 EGD and stent repositioning   Central access: 01/22/24 TPN start date: 01/22/24  Nutritional Goals:  Goal concentrated TPN is 70 ml/hr (provides 116 g of protein and 2301 kcals per day  RD Assessment:  Estimated Needs Total Energy Estimated Needs: 2300-2500 kcals Total Protein Estimated Needs: 115-130 g Total Fluid Estimated Needs: 2L  Current Nutrition:  TPN 10/13- 10/14 DYS1: not taking any po  10/15: NPO d/t increased abd distension  10/22 Propofol  at 7.6- 20.2 ml/hr, providing ~370 kcals/24hr 10/23: switch propofol  back to dexmedetomidine   Plan:  Continue concentrated TPN at goal 70 mL/hr, providing 100% estimated nutritional needs   Electrolytes in TPN: Na 0 mEq/L, incr K 40 mEq/L, Ca 0 mEq/L, Mg 0 mEq/L, Phos 10 mmol/L. Cl:Ac max acetate (cannot do 1:1 or 1:2 due to no Na in bag ) Continue standard MVI and trace elements to TPN Continue 13 units insulin  regular to TPN, stop BG checks (getting daily labs) Monitor TPN labs on Mon/Thurs, and PRN    Thank you for involving pharmacy in this patient's care.  Harlene Boga, PharmD, BCPS, BCCCP Clinical Pharmacist  Please check AMION for all [facility] phone numbers After 10:00 PM, call Main  Pharmacy [phone]

## 2024-02-13 DIAGNOSIS — R52 Pain, unspecified: Secondary | ICD-10-CM | POA: Insufficient documentation

## 2024-02-13 DIAGNOSIS — E43 Unspecified severe protein-calorie malnutrition: Secondary | ICD-10-CM | POA: Diagnosis not present

## 2024-02-13 DIAGNOSIS — I5033 Acute on chronic diastolic (congestive) heart failure: Secondary | ICD-10-CM

## 2024-02-13 DIAGNOSIS — L8990 Pressure ulcer of unspecified site, unspecified stage: Secondary | ICD-10-CM | POA: Insufficient documentation

## 2024-02-13 DIAGNOSIS — I4892 Unspecified atrial flutter: Secondary | ICD-10-CM

## 2024-02-13 DIAGNOSIS — J9601 Acute respiratory failure with hypoxia: Secondary | ICD-10-CM | POA: Diagnosis not present

## 2024-02-13 DIAGNOSIS — Z9911 Dependence on respirator [ventilator] status: Secondary | ICD-10-CM | POA: Diagnosis not present

## 2024-02-13 DIAGNOSIS — I5023 Acute on chronic systolic (congestive) heart failure: Secondary | ICD-10-CM | POA: Insufficient documentation

## 2024-02-13 DIAGNOSIS — K223 Perforation of esophagus: Secondary | ICD-10-CM | POA: Diagnosis not present

## 2024-02-13 LAB — CBC
HCT: 35.5 % — ABNORMAL LOW (ref 39.0–52.0)
Hemoglobin: 10.9 g/dL — ABNORMAL LOW (ref 13.0–17.0)
MCH: 29.2 pg (ref 26.0–34.0)
MCHC: 30.7 g/dL (ref 30.0–36.0)
MCV: 95.2 fL (ref 80.0–100.0)
Platelets: 355 K/uL (ref 150–400)
RBC: 3.73 MIL/uL — ABNORMAL LOW (ref 4.22–5.81)
RDW: 19 % — ABNORMAL HIGH (ref 11.5–15.5)
WBC: 13.4 K/uL — ABNORMAL HIGH (ref 4.0–10.5)
nRBC: 0.2 % (ref 0.0–0.2)

## 2024-02-13 LAB — RENAL FUNCTION PANEL
Albumin: 1.7 g/dL — ABNORMAL LOW (ref 3.5–5.0)
Anion gap: 11 (ref 5–15)
BUN: 86 mg/dL — ABNORMAL HIGH (ref 8–23)
CO2: 28 mmol/L (ref 22–32)
Calcium: 9.2 mg/dL (ref 8.9–10.3)
Chloride: 105 mmol/L (ref 98–111)
Creatinine, Ser: 1.72 mg/dL — ABNORMAL HIGH (ref 0.61–1.24)
GFR, Estimated: 42 mL/min — ABNORMAL LOW (ref >60)
Glucose, Bld: 157 mg/dL — ABNORMAL HIGH (ref 70–99)
Phosphorus: 4.1 mg/dL (ref 2.5–4.6)
Potassium: 3.6 mmol/L (ref 3.5–5.1)
Sodium: 144 mmol/L (ref 135–145)

## 2024-02-13 LAB — MAGNESIUM: Magnesium: 2.2 mg/dL (ref 1.7–2.4)

## 2024-02-13 LAB — HEPARIN LEVEL (UNFRACTIONATED): Heparin Unfractionated: 0.37 [IU]/mL (ref 0.30–0.70)

## 2024-02-13 MED ORDER — IPRATROPIUM-ALBUTEROL 0.5-2.5 (3) MG/3ML IN SOLN
3.0000 mL | Freq: Two times a day (BID) | RESPIRATORY_TRACT | Status: DC
  Administered 2024-02-13 – 2024-02-14 (×3): 3 mL via RESPIRATORY_TRACT
  Filled 2024-02-13 (×3): qty 3

## 2024-02-13 MED ORDER — PIPERACILLIN-TAZOBACTAM 3.375 G IVPB
3.3750 g | Freq: Three times a day (TID) | INTRAVENOUS | Status: DC
  Administered 2024-02-13 – 2024-03-20 (×110): 3.375 g via INTRAVENOUS
  Filled 2024-02-13 (×114): qty 50

## 2024-02-13 MED ORDER — TRACE MINERALS CU-MN-SE-ZN 300-55-60-3000 MCG/ML IV SOLN
INTRAVENOUS | Status: AC
  Filled 2024-02-13: qty 772.8

## 2024-02-13 MED ORDER — POTASSIUM CHLORIDE 10 MEQ/50ML IV SOLN
10.0000 meq | INTRAVENOUS | Status: AC
  Administered 2024-02-13 (×2): 10 meq via INTRAVENOUS
  Filled 2024-02-13 (×2): qty 50

## 2024-02-13 MED ORDER — FUROSEMIDE 10 MG/ML IJ SOLN
160.0000 mg | Freq: Every day | INTRAVENOUS | Status: DC
  Administered 2024-02-13 – 2024-02-16 (×4): 160 mg via INTRAVENOUS
  Filled 2024-02-13 (×4): qty 10

## 2024-02-13 NOTE — TOC Progression Note (Signed)
 Transition of Care [facility]) - Progression Note    Patient Details  Name: Billy [name] MRN: [id number] Date of Birth: 1950/06/08  Transition of [facility]) CM/SW Contact  Justina Delcia Czar, RN Phone Number: [phone] 02/13/2024, 2:45 PM  Clinical Narrative:     Patient continues with amio gtt, IV abx, and TPN. Will have attending review for LTAC.  Will need PT/OT evaluation and recommendations. LTAC vs SNF rehab.    [employer] rep, Pam RN is follow for possible Home IV abx. Will need OPAT orders. HH arranged with [facility] for [facility]. Will need HH orders with [facility].    Chart reviewed for discharge readiness, patient not medically stable for d/c. Inpatient CM/CSW will continue to monitor pt's advancement through interdisciplinary progression rounds.  If new pt transition needs arise, MD please place a TOC consult.    Expected Discharge Plan: Home/Self Care Barriers to Discharge: Continued Medical Work up    Expected Discharge Plan and Services   Discharge Planning Services: CM Consult   Living arrangements for the past 2 months: Single Family Home                    Social Drivers of Health (SDOH) Interventions SDOH Screenings   Food Insecurity: No Food Insecurity (01/22/2024)  Housing: High Risk (01/22/2024)  Transportation Needs: No Transportation Needs (01/22/2024)  Utilities: Not At Risk (01/22/2024)  Depression (PHQ2-9): Low Risk  (12/01/2020)  Social Connections: Socially Isolated (01/22/2024)  Tobacco Use: Low Risk  (01/31/2024)    Readmission Risk Interventions    06/27/2023    1:52 PM 10/14/2022   11:29 AM  Readmission Risk Prevention Plan  Post Dischage Appt  Complete  Medication Screening  Complete  Transportation Screening Complete Complete  HRI or Home Care Consult Complete   Social Work Consult for Recovery Care Planning/Counseling Complete   Palliative Care Screening Not Applicable   Medication Review Oceanographer) Referral to Pharmacy

## 2024-02-13 NOTE — Progress Notes (Signed)
 PHARMACY - TOTAL PARENTERAL NUTRITION CONSULT NOTE   Indication: intolerance to enteral feedings, esophageal perforation  Patient Measurements: Height: 6' 1 (185.4 cm) Weight: 71.6 kg (157 lb 13.6 oz) IBW/kg (Calculated) : 79.9 TPN AdjBW (KG): 80.6 Body mass index is 20.83 kg/m.  Assessment:  73 year old man with medical history significant for cardiac arrest earlier this year who was admitted after several days of nausea and vomiting followed by sudden onset severe tearing epigastric pain radiating to back and chest. 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 Pharmacy consulted to initiate TPN due to inability to tolerate enteral nutrition at this time.  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 PTA takes torsemide  20 mg PO daily, now held. Aggressive fluid resuscitation recommended per GI. Per HF, patient currently euvolemic but will hold diuretic for now. Due to CHF will target lower end of fluid range for TPN and supplement additional fluids per MD outside of TPN to prevent fluid overload.   Stent placed on 10/6, esophogram not showing any leak. Diet advanced to CLD. 10/15 Leak present and made NPO. 10/24 esophagram with persistent leak; will need stent replacement in 3-4 weeks.   10/28 AM update:  Patient has weights that are significantly more than admission weight of 72.7 kg on 10/5. This may represent false weights from bed scale.  Current weight is 71.6 kg  Glucose / Insulin : A1C 6.0% (02/2023);  BG <140, 13 units insulin  in TPN  Electrolytes: Na 144, Cl 105, K 3.6, CO2 28 Ca 9.2 [CoCa 11.04], Phos 4.1, Mg 2.2 Renal: Scr up 1.72 (BL ~2), BUN 86 Hepatic: AST/ALT/tbili wnl, Alk Phos 189, alb 1.7, TG 41 Intake / Output; MIVF: furosemide  160 mg IV on 10/27; UOP 1.45 mL/kg/hr, chest tube 90 ml, LBM 10/27 Net IO Since Admission: 6,371.69 mL [02/13/24 0906]  GI Imaging: 10/05 CT: concerning for esophageal perforation, masslike area of  soft tissue thickening adjacent to and surrounding the esophagus  10/06 Esoph XR: distal esophageal perforation 10/7 Esoph XR: No leak associated with stent coverage of esophageal perforation 10/14 KUB: Gas-filled loops of small bowel and colon suggestive of ileus 10/15 Esoph XR: esophageal perforation/leak at the distal esophagus, proximal to the esophageal stent 10/24 esophagram: persistent leak despite being covered by the esophageal stent.  GI Surgeries / Procedures:  10/6: esophageal stent placement  10/15 EGD and stent repositioning   Central access: 01/22/24 TPN start date: 01/22/24  Nutritional Goals:  Goal concentrated TPN is 70 ml/hr (provides 116 g of protein and 2301 kcals per day  RD Assessment:  Estimated Needs Total Energy Estimated Needs: 2300-2500 kcals Total Protein Estimated Needs: 115-130 g Total Fluid Estimated Needs: 2L  Current Nutrition:  TPN  10/13- 10/14 DYS1: not taking any po  10/15: NPO d/t increased abd distension  10/22 Propofol  at 7.6- 20.2 ml/hr, providing ~370 kcals/24hr- OFF 10/23: switch propofol  back to dexmedetomidine - OFF   Plan:  Continue concentrated TPN at goal 70 mL/hr, providing 100% estimated nutritional needs   Electrolytes in TPN: Na 0 mEq/L, K 45 mEq/L, Ca 0 mEq/L, Mg 0 mEq/L, Phos 10 mmol/L. Cl:Ac max acetate (cannot do 1:1 or 1:2 due to no Na in bag ) Continue standard MVI and trace elements to TPN Continue 13 units insulin  regular to TPN K runs iv x2 Monitor TPN labs on Mon/Thurs, and PRN    Thank you for involving pharmacy in this patient's care.   Benedetta Heath BS, PharmD, BCPS Clinical Pharmacist 02/13/2024  6:52 AM  Contact: [phone] after 3 PM

## 2024-02-13 NOTE — Progress Notes (Addendum)
 Advanced Heart Failure Rounding Note  Cardiologist: Dorn Lesches, MD  Chief Complaint: S/p esophageal stent Subjective:    - S/p EGD with stent repositioning by Dr. Shyrl 10/15  - Limited echo 02/08/24 EF 60-65%  - Barium swallow 02/09/24 persistent leak   Still getting TPN, Slightly net + 400 cc yesterday. CVP 6-7 Scr relatively stable at 1.7   Sitting up in chair. Still complaining of pain. Using pain pump. No other complaints.    Objective:    Weight Range: 71.6 kg Body mass index is 20.83 kg/m.   Vital Signs:   Temp:  [96.8 F (36 C)-98.5 F (36.9 C)] 97.6 F (36.4 C) (10/28 1141) Pulse Rate:  [57-77] 77 (10/28 1141) Resp:  [15-35] 23 (10/28 1141) BP: (103-175)/(53-84) 162/77 (10/28 1141) SpO2:  [93 %-100 %] 99 % (10/28 1141) FiO2 (%):  [40 %] 40 % (10/28 0436) Last BM Date : 02/12/24  Weight change: Filed Weights   02/08/24 0500 02/09/24 0401 02/12/24 0500  Weight: 82.7 kg 79.6 kg 71.6 kg   Intake/Output:  Intake/Output Summary (Last 24 hours) at 02/13/2024 1147 Last data filed at 02/13/2024 1102 Gross per 24 hour  Intake 2705.16 ml  Output 2590 ml  Net 115.16 ml    Physical Exam   CVP 6  General: fatigued appearing, thin male. NAD  Cardiac: JVP not elevated, RRR. No MRG + CTs  Extremities: warm and dry, trace b/l pretibial edema +RUE PICC  Neuro: A&O. Moves all 4 ext w/o difficulty, affect flat   Telemetry   NSR 60s (personally reviewed)  Labs    CBC Recent Labs    02/12/24 0432 02/13/24 0513  WBC 11.9* 13.4*  HGB 9.8* 10.9*  HCT 31.3* 35.5*  MCV 88.4 95.2  PLT 338 355   Basic Metabolic Panel Recent Labs    89/72/74 0432 02/13/24 0637  NA 147*  147* 144  K 3.9  3.9 3.6  CL 107  106 105  CO2 30  29 28   GLUCOSE 137*  136* 157*  BUN 83*  83* 86*  CREATININE 1.67*  1.66* 1.72*  CALCIUM  9.3  9.2 9.2  MG 2.2 2.2  PHOS 4.2  4.3 4.1   Liver Function Tests Recent Labs    02/12/24 0432 02/13/24 0637  AST 17   --   ALT 20  --   ALKPHOS 95  --   BILITOT 0.5  --   PROT 6.4*  --   ALBUMIN  1.5*  1.5* 1.7*   BNP (last 3 results) Recent Labs    03/09/23 1544 03/11/23 0218 06/27/23 1116  BNP 660.9* 491.3* 646.4*   Hemoglobin A1C No results for input(s): HGBA1C in the last 72 hours.  Fasting Lipid Panel Recent Labs    02/12/24 0432  TRIG 41   Medications:    Scheduled Medications:  bisacodyl   10 mg Rectal Q0600   Chlorhexidine  Gluconate Cloth  6 each Topical Daily   HYDROmorphone    Intravenous Q4H   ipratropium-albuterol   3 mL Nebulization Q6H   lidocaine   1 patch Transdermal Q24H   pantoprazole  (PROTONIX ) IV  40 mg Intravenous Q12H   sodium chloride  flush  10 mL Intrapleural Q8H   sodium chloride  flush  10-40 mL Intracatheter Q12H    Infusions:  amiodarone  30 mg/hr (02/13/24 0641)   dexmedetomidine  (PRECEDEX ) IV infusion Stopped (02/12/24 1400)   fluconazole (DIFLUCAN) IV 200 mg (02/13/24 0858)   heparin  1,950 Units/hr (02/13/24 0641)   norepinephrine  (LEVOPHED ) Adult infusion Stopped (02/08/24  0745)   piperacillin -tazobactam (ZOSYN )  IV 3.375 g (02/13/24 1007)   potassium chloride  10 mEq (02/13/24 1059)   TPN ADULT (ION) 70 mL/hr at 02/13/24 0641   TPN ADULT (ION)      PRN Medications: diphenhydrAMINE **OR** diphenhydrAMINE, [name] butt cream, hydrALAZINE , levalbuterol, LORazepam , naloxone  **AND** sodium chloride  flush, ondansetron  (ZOFRAN ) IV, mouth rinse, sodium chloride  flush  Patient Profile    Patient with longstanding history of chronic heart failure with preserved ejection fraction, RV failure with history of pulmonary embolism presents with esophageal perforation.  S/P Stent Esophageal Stent. Stent repositioned 10/15.  Assessment/Plan   1.  Esophageal Perforation - Boerhaave syndrome.  - CT C/A/P: with pneumomediastinum and large mass-like density in lower esophagus - 10/25 S/P EGD with esophageal stent placed  - Stent migrated w/ leak>back to OR  10/15 for repositioning  - Barium swallow 10/24 persistent leak  - On TPN; Strict NPO - Continues on fluconazole; abx escalated d/t PNA, now on Vanc + Meropenum   => CCM/TCTS  managing.   2. Acute on chronic HFpEF: h/o cardiogenic shock with RV failure in the setting of cardiac arrest thought to be caused by acute PE vs ACS in 3/25. Echo at the time showed EF 55%, severe, LVH, G2DD, and severely reduced RV function with strain. There was concern for cardiac sarcoid amyloid, however CMR LGE consistent with ischemic disease. RV on echo 9/25 with complete recovery, EF 60-65%, nl RV function.    - RHC 3/25: Mild PAH with severe RV failure though CO is preserved.   - Repeat echo 10/24 EF 60-65% - Euvolemic on exam. CVP 6  - continue lasix  160 mg daily with TPN to keep even - Continue TED hose  3. 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 Medical management.  - suspect CP related to esophageal issues and not CAD - strict NPO, holding PO meds   4.  AKI on CKD Stage 3b: Baseline sCr ~ 2.1. - Creatinine peaked to 2.6.  - overall improved, SCr stable 1.7 today     5. PAF:   - s/p TEE/DCCV 3/25 - has converted to AF a couple times this admit - in NSR on tele   - continue IV amio (NPO) and heparin   6.  H/o DVT/PE:  Bilateral upper and lower DVTs, mixed acute and chronic.  - s/p infrarenal IVC placement 3/25 - V/Q 3/25 with multiple perfusion defects - continue IV heparin . Will need lifelong AC, transition to Eliquis  once taking PO - no change   7. Carotid stenosis: h/o CVA. TCAR in 6/24. Okay to remain off plavix  at this point. - carotid US  9/25: widely patent carotid stent on R and no stenosis on L   8. Acute Hypoxic Respiratory Failure/PNA/ Exudative Lt Pleural Effusion  - on Vanc + meropenum for aspiration PNA  - CT placed for pleural effusion. Respiratory Cx w/ rare yeast, BCx NGTD  - improved. Now on RA  9. Severe  protein calorie malnutrition - Nutrition following. On TPN   Caffie Shed, PA-C 02/13/2024  Advanced Heart Failure Team Pager [phone] (M-F; 7a - 5p)  Please contact [facility] for night-coverage after hours (4p -7a ) and weekends on [email or web address]   Patient seen and examined with the above-signed Advanced Practice Provider and/or Housestaff. I personally reviewed laboratory data, imaging studies and relevant notes. I independently examined the patient and formulated the important aspects of the plan. I  have edited the note to reflect any of my changes or salient points. I have personally discussed the plan with the patient and/or family.  Remains in NSR on IV amio. Still NPO on TPN. On PCA for pain   Volume status stable on lasix  160 IV daily.   General:  Sitting in chair No resp difficulty HEENT: normal Neck: supple. no JVD.  Cor: Regular rate & rhythm. No rubs, gallops or murmurs. Lungs: clear Abdomen: soft, nontender, nondistended.Good bowel sounds. Extremities: no cyanosis, clubbing, rash, tr edema Neuro: alert & orientedx3, cranial nerves grossly intact. moves all 4 extremities w/o difficulty. Affect pleasant  Volume status stable. Remains in NSR> Continue current plan until he can take pos.   Toribio Fuel, MD  4:32 PM

## 2024-02-13 NOTE — Progress Notes (Signed)
 Unable to obtain a standing weight, pt is in recliner & refusing to stand.   Lonell LITTIE Lyme, RN

## 2024-02-13 NOTE — Progress Notes (Signed)
 Occupational Therapy Treatment Patient Details Name: Billy [name] MRN: [id number] DOB: May 28, 1950 Today's Date: 02/13/2024   History of present illness Billy [name] is a 73 y.o. male who presented to [facility] ED 01/21/24 with several days of vomiting followed by an episode of severe tearing epigastric pain. Work-up revealed esophageal tear. Pt s/p esophageal stent with EGD 10/7. Return to OR for esophageal stent adjustment on 10/15, remained intubated after procedure due to suspected aspiration. Extubated 10/16.  Pt had CT placed 10/16. Reintubated 10/20-10/24. PMHx: HFrEF, AFib, CAD, PE complicated by cardiogenic shock, CKD 4, MI, peripheral neuropathy, and TIA.   OT comments  Pt progressing toward goals this session, currently needs max A for LB ADL and min-mod +2 for standing in stedy frame. Pt able to stand x2 in stedy for standing BUE functional reach task and BLE stepping in place, with ~2 min seated rest break in between. Pt presenting with impairments listed below, will follow acutely. Recommend OT at [facility] upon d/c.      If plan is discharge home, recommend the following:  Two people to help with walking and/or transfers;Two people to help with bathing/dressing/bathroom;Assistance with cooking/housework;Assistance with feeding;Direct supervision/assist for medications management;Direct supervision/assist for financial management;Assist for transportation;Help with stairs or ramp for entrance   Equipment Recommendations  Other (comment) (defer)    Recommendations for Other Services Rehab consult    Precautions / Restrictions Precautions Precautions: Fall Recall of Precautions/Restrictions: Impaired Precaution/Restrictions Comments: BIL chest tubes in place; Restrictions Weight Bearing Restrictions Per Provider Order: No       Mobility Bed Mobility               General bed mobility comments: OOB in chair upon arrival and departure    Transfers Overall transfer level: Needs  assistance Equipment used: 2 person hand held assist, Ambulation equipment used Transfers: Sit to/from Stand Sit to Stand: Min assist, Mod assist, +2 physical assistance             Transfer via Lift Equipment: Stedy   Balance Overall balance assessment: Needs assistance Sitting-balance support: Feet supported, Bilateral upper extremity supported Sitting balance-Leahy Scale: Fair Sitting balance - Comments: sits on edge of chair without back support   Standing balance support: During functional activity, Bilateral upper extremity supported Standing balance-Leahy Scale: Poor Standing balance comment: reliant on external support                           ADL either performed or assessed with clinical judgement   ADL                       Lower Body Dressing: Maximal assistance;Sitting/lateral leans   Toilet Transfer: Minimal assistance;+2 for physical assistance Toilet Transfer Details (indicate cue type and reason): transfer prep by standing in stedy                Extremity/Trunk Assessment Upper Extremity Assessment Upper Extremity Assessment: Generalized weakness   Lower Extremity Assessment Lower Extremity Assessment: Defer to PT evaluation        Vision   Vision Assessment?: No apparent visual deficits   Perception Perception Perception: Not tested   Praxis Praxis Praxis: Not tested   Communication Communication Communication: Impaired Factors Affecting Communication: Difficulty expressing self   Cognition Arousal: Alert Behavior During Therapy: Flat affect Cognition: Difficult to assess Difficult to assess due to: Impaired communication           OT -  Cognition Comments: delayed process and resopnse time, at times needs up to 15 sec to respond to questions asked, relate to ?SOB                 Following commands: Impaired Following commands impaired: Follows one step commands with increased time, Follows one step  commands inconsistently      Cueing   Cueing Techniques: Verbal cues, Gestural cues, Tactile cues  Exercises General Exercises - Upper Extremity Shoulder Flexion: AROM, Both, 10 reps, Standing Other Exercises Other Exercises: standing marches x20    Shoulder Instructions       General Comments VSS on 2L    Pertinent Vitals/ Pain       Pain Assessment Pain Assessment: Faces Pain Score: 4  Faces Pain Scale: Hurts little more Pain Location: did not specify Pain Descriptors / Indicators: Discomfort Pain Intervention(s): Monitored during session  Home Living                                          Prior Functioning/Environment              Frequency  Min 2X/week        Progress Toward Goals  OT Goals(current goals can now be found in the care plan section)  Progress towards OT goals: Progressing toward goals  Acute Rehab OT Goals OT Goal Formulation: With patient Time For Goal Achievement: 02/21/24 Potential to Achieve Goals: Fair ADL Goals Pt Will Perform Grooming: with supervision;sitting Pt Will Perform Lower Body Bathing: with supervision;sit to/from stand Pt Will Perform Upper Body Dressing: with min assist;sitting Pt Will Perform Lower Body Dressing: with supervision;sit to/from stand Pt Will Transfer to Toilet: with mod assist;stand pivot transfer Pt Will Perform Toileting - Clothing Manipulation and hygiene: with mod assist;sitting/lateral leans Additional ADL Goal #1: Pt will complete bed mobility with mod assist in preparation for ADLs.  Plan      Co-evaluation    PT/OT/SLP Co-Evaluation/Treatment: Yes            AM-PAC OT 6 Clicks Daily Activity     Outcome Measure   Help from another person eating meals?: Total Help from another person taking care of personal grooming?: A Little Help from another person toileting, which includes using toliet, bedpan, or urinal?: Total Help from another person bathing (including  washing, rinsing, drying)?: A Lot Help from another person to put on and taking off regular upper body clothing?: A Lot Help from another person to put on and taking off regular lower body clothing?: A Lot 6 Click Score: 11    End of Session Equipment Utilized During Treatment: Oxygen   OT Visit Diagnosis: Muscle weakness (generalized) (M62.81)   Activity Tolerance Patient limited by fatigue   Patient Left in chair;with call bell/phone within reach;with chair alarm set   Nurse Communication Mobility status        Time: 1523-1550 OT Time Calculation (min): 27 min  Charges: OT General Charges $OT Visit: 1 Visit OT Treatments $Therapeutic Activity: 23-37 mins  Bridger Pizzi K, OTD, OTR/L SecureChat Preferred Acute Rehab [phone]   [name] 02/13/2024, 4:42 PM

## 2024-02-13 NOTE — Progress Notes (Signed)
 PHARMACY - ANTICOAGULATION CONSULT NOTE  Pharmacy Consult for heparin  infusion Indication: atrial fibrillation  Allergies  Allergen Reactions   Norvasc  [Amlodipine ] Swelling and Other (See Comments)    Excessive BLE swelling Skin discoloration, blotching   Jardiance  [Empagliflozin ] Itching and Other (See Comments)    Patient mentioned penile swelling, pain   Morphine And Codeine Itching    Opioid-induced pruritus    Patient Measurements: Height: 6' 1 (185.4 cm) Weight: 71.6 kg (157 lb 13.6 oz) IBW/kg (Calculated) : 79.9 HEPARIN  DW (KG): 80.6  Vital Signs: Temp: 97.6 F (36.4 C) (10/28 0804) Temp Source: Oral (10/28 0804) BP: 175/75 (10/28 0804) Pulse Rate: 76 (10/28 0804)  Labs: Recent Labs    02/11/24 0953 02/11/24 1110 02/11/24 1110 02/12/24 0432 02/13/24 0512 02/13/24 0513 02/13/24 0637  HGB  --  9.3*   < > 9.8*  --  10.9*  --   HCT  --  29.8*  --  31.3*  --  35.5*  --   PLT  --  347  --  338  --  355  --   HEPARINUNFRC  --  0.39  --  0.50 0.37  --   --   CREATININE 1.51*  --   --  1.67*  1.66*  --   --  1.72*   < > = values in this interval not displayed.    Estimated Creatinine Clearance: 39.9 mL/min (A) (by C-G formula based on SCr of 1.72 mg/dL (H)).   Assessment: 73 yo M presenting for esophageal rupture, now s/p esophageal stent placement on 10/6. PMH significant for HFrEF, Afib on Eliquis  PTA. Last dose of Eliquis  01/20/24, time unknown. Pharmacy consulted for heparin  management.  10/15 - underwent EGD with stent reposition on 10/15 after finding esophageal leak  Heparin  level this AM 0.37, therapeutic at heparin  1950 units/hr. No issues with infusion running or signs of bleeding per RN. CBC stable (Hgb 10.9, PLT 355).  Pt remains NPO  Goal of Therapy:  Heparin  level 0.3-0.5 units/ml Monitor platelets by anticoagulation protocol: Yes   Plan:  Continue IV heparin  to 1950 units/hr Monitor heparin  level daily Monitor CBC and s/sx of bleeding  daily  Harlene Barlow, Berdine BIRCH, BCPS, [facility] Clinical Pharmacist  02/13/2024 8:16 AM   [facility] pharmacy phone numbers are listed on [email or web address]

## 2024-02-13 NOTE — Progress Notes (Signed)
 Progress Note   Patient: Billy [name] FMW:[id number] DOB: 04/27/1950 DOA: 01/21/2024     23 DOS: the patient was seen and examined on 02/13/2024   Brief hospital course: Dagen Beevers is a 73 year old man with past medical history significant for cardiac arrest earlier this year along with HFrEF, Afib, PE presented with several days of N/V from a stomach bug followed by sudden onset severe tearing epigastric pain radiating to back and chest. Workup revealed esophageal tear. Prior EGD during cardiac arrest admit showed duodenitis, nonspecific requiring hemospray, erosive gastritis but normal esophagus.  An esophageal stent was placed on 10/7 and then a subsequent swallow study revealed an additional leak. He was taken back to the OR for EGD and stent repositioning on 10/15 and was hypoxic with gastric contents noted on intubation. Pt was too hypoxic for extubation post-procedure, so the patient was left intubated transferred to intensive care unit. ICU stay complicated by high O2 requirement, left sided pleural effusion, chest tube placement 10/16, reintubated 10/20. Patient responded to IV diuresis, extubated and stable to be transferred to [name] 02/13/24.  Assessment and Plan: Esophageal rupture/ Boerhaave syndrome S/p stent placement with repositioning 10/16. Esophagogram 10/24 shows persistent leak Cardiothoracic following. Continue antibiotics, drainage. He will need stent replacement every 3-4 weeks.  Acute hypoxic/ hypercapnic respiratory failure Aspiration pneumonia Pulmonary edema Bilateral pleura effusions- Continue meropenem therapy. Continue diflucan for candida in respiratory cultures. S/p extubation 02/08/24. Continue to wean supplemental oxygen , maintain saturation >92%.  Acute on chronic diastolic CHF- Echo shows EF 65%,  Patient is on lasix  drip. Continue daily weights, strict input and output. GDMT limited due to AKI. 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 Follow heart  failure team recommendations.   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 Avoid nephrotoxic agents.   PAF and prior DVT/PE- S/p TEE DCCV 3/25. Remains in A-fib with controlled rate Continue amiodarone  drip. Continue heparin  infusion per pharmacy protocol.   Intractable pain- Anxiety disorder Patient is on Dilaudid  PCA. Ativan  as needed.  Acute on chronic anemia Hemoglobin is stable around 10, post PRBC transfusion. No active bleeding. H&H and transfuse less than 7 Watch for signs of bleeding.  H/o PE/ DVT- On heparin  drip currently. He has h/o IVC filter.   Severe protein malnutrition Patient is on TPN per protocol. Continue supplementation.     Out of bed to chair. Incentive spirometry. Nursing supportive care. Fall, aspiration precautions. Diet:  Diet Orders (From admission, onward)     Start     Ordered   01/31/24 0736  Diet NPO time specified  Diet effective now        01/31/24 0736           DVT prophylaxis: SCDs Start: 01/21/24 2023  Level of care: Telemetry Cardiac   Code Status: Full Code  Subjective: Patient is seen and examined today morning, he is sitting in chair. States he has pain over his epigastric area. Asked to increase his dilaudid  dose. He is ill looking.   Physical Exam: Vitals:   02/13/24 0804 02/13/24 0805 02/13/24 1102 02/13/24 1141  BP: (!) 175/75   (!) 162/77  Pulse: 76   77  Resp: 18  20 (!) 23  Temp: 97.6 F (36.4 C)   97.6 F (36.4 C)  TempSrc: Oral   Oral  SpO2: 99% 100% 98% 99%  Weight:      Height:  General - Elderly ill Caucasian thin built male, distress due to pain HEENT - PERRLA, EOMI, atraumatic head, non tender sinuses. Lung - Clear, basal rales, rhonchi, wheezes. Left chest tube to suction. Heart - S1, S2 heard, no murmurs, rubs, trace pedal edema. Abdomen - Soft, non tender, bowel sounds good Neuro - Alert, awake and  oriented x 3, non focal exam. Skin - Warm and dry.  Data Reviewed:      Latest Ref Rng & Units 02/13/2024    5:13 AM 02/12/2024    4:32 AM 02/11/2024   11:10 AM  CBC  WBC 4.0 - 10.5 K/uL 13.4  11.9  12.7   Hemoglobin 13.0 - 17.0 g/dL 89.0  9.8  9.3   Hematocrit 39.0 - 52.0 % 35.5  31.3  29.8   Platelets 150 - 400 K/uL 355  338  347       Latest Ref Rng & Units 02/13/2024    6:37 AM 02/12/2024    4:32 AM 02/11/2024    9:53 AM  BMP  Glucose 70 - 99 mg/dL 842  862    863  849   BUN 8 - 23 mg/dL 86  83    83  82   Creatinine 0.61 - 1.24 mg/dL 8.27  8.32    8.33  8.48   Sodium 135 - 145 mmol/L 144  147    147  147   Potassium 3.5 - 5.1 mmol/L 3.6  3.9    3.9  4.0   Chloride 98 - 111 mmol/L 105  107    106  107   CO2 22 - 32 mmol/L 28  30    29  28    Calcium  8.9 - 10.3 mg/dL 9.2  9.3    9.2  8.7    No results found.  Family Communication: Discussed with patient, understand and agree. All questions answered.  Disposition: Status is: Inpatient Remains inpatient appropriate because: sick, hypoxia, left chest tube,IV lasix , amio, dilaudid  pca.  Planned Discharge Destination: Rehab     Time spent: 54 minutes  Author: Concepcion Riser, MD 02/13/2024 2:53 PM Secure chat 7am to 7pm For on call review [email or web address].

## 2024-02-14 DIAGNOSIS — I5023 Acute on chronic systolic (congestive) heart failure: Secondary | ICD-10-CM

## 2024-02-14 DIAGNOSIS — E43 Unspecified severe protein-calorie malnutrition: Secondary | ICD-10-CM | POA: Diagnosis not present

## 2024-02-14 DIAGNOSIS — J90 Pleural effusion, not elsewhere classified: Secondary | ICD-10-CM | POA: Diagnosis not present

## 2024-02-14 DIAGNOSIS — K223 Perforation of esophagus: Secondary | ICD-10-CM | POA: Diagnosis not present

## 2024-02-14 DIAGNOSIS — J9601 Acute respiratory failure with hypoxia: Secondary | ICD-10-CM

## 2024-02-14 DIAGNOSIS — R52 Pain, unspecified: Secondary | ICD-10-CM

## 2024-02-14 LAB — CBC
HCT: 33.7 % — ABNORMAL LOW (ref 39.0–52.0)
Hemoglobin: 10.5 g/dL — ABNORMAL LOW (ref 13.0–17.0)
MCH: 27.5 pg (ref 26.0–34.0)
MCHC: 31.2 g/dL (ref 30.0–36.0)
MCV: 88.2 fL (ref 80.0–100.0)
Platelets: 321 K/uL (ref 150–400)
RBC: 3.82 MIL/uL — ABNORMAL LOW (ref 4.22–5.81)
RDW: 18.4 % — ABNORMAL HIGH (ref 11.5–15.5)
WBC: 14.4 K/uL — ABNORMAL HIGH (ref 4.0–10.5)
nRBC: 0 % (ref 0.0–0.2)

## 2024-02-14 LAB — RENAL FUNCTION PANEL
Albumin: 1.7 g/dL — ABNORMAL LOW (ref 3.5–5.0)
Anion gap: 11 (ref 5–15)
BUN: 80 mg/dL — ABNORMAL HIGH (ref 8–23)
CO2: 27 mmol/L (ref 22–32)
Calcium: 9.4 mg/dL (ref 8.9–10.3)
Chloride: 107 mmol/L (ref 98–111)
Creatinine, Ser: 1.79 mg/dL — ABNORMAL HIGH (ref 0.61–1.24)
GFR, Estimated: 40 mL/min — ABNORMAL LOW (ref >60)
Glucose, Bld: 148 mg/dL — ABNORMAL HIGH (ref 70–99)
Phosphorus: 4.1 mg/dL (ref 2.5–4.6)
Potassium: 4.1 mmol/L (ref 3.5–5.1)
Sodium: 145 mmol/L (ref 135–145)

## 2024-02-14 LAB — HEPARIN LEVEL (UNFRACTIONATED): Heparin Unfractionated: 0.42 [IU]/mL (ref 0.30–0.70)

## 2024-02-14 LAB — MAGNESIUM: Magnesium: 2.2 mg/dL (ref 1.7–2.4)

## 2024-02-14 MED ORDER — TRACE MINERALS CU-MN-SE-ZN 300-55-60-3000 MCG/ML IV SOLN
INTRAVENOUS | Status: AC
  Filled 2024-02-14: qty 772.8

## 2024-02-14 NOTE — Progress Notes (Addendum)
 Advanced Heart Failure Rounding Note  Cardiologist: Dorn Lesches, MD  Chief Complaint: S/p esophageal stent Subjective:    - S/p EGD with stent repositioning by Dr. Shyrl 10/15  - Limited echo 02/08/24 EF 60-65%  - Barium swallow 02/09/24 persistent leak   Still getting TPN, Slightly net + 400 cc yesterday. CVP 6-7 Scr relatively stable at 1.7  Using PCA for pain  Working with PT on exam. No SOB, CP.   Objective:    Weight Range: 70.8 kg Body mass index is 20.58 kg/m.   Vital Signs:   Temp:  [97.3 F (36.3 C)-97.8 F (36.6 C)] 97.6 F (36.4 C) (10/29 0700) Pulse Rate:  [63-100] 64 (10/29 0700) Resp:  [15-23] 22 (10/29 1006) BP: (105-162)/(64-92) 149/92 (10/29 0700) SpO2:  [85 %-100 %] 98 % (10/29 1006) FiO2 (%):  [40 %] 40 % (10/29 1006) Weight:  [70.8 kg] 70.8 kg (10/29 0329) Last BM Date : 02/12/24  Weight change: Filed Weights   02/09/24 0401 02/12/24 0500 02/14/24 0329  Weight: 79.6 kg 71.6 kg 70.8 kg   Intake/Output:  Intake/Output Summary (Last 24 hours) at 02/14/2024 1122 Last data filed at 02/14/2024 1100 Gross per 24 hour  Intake 3014.27 ml  Output 3860 ml  Net -845.73 ml    Physical Exam   General: Haggard appearing. No distress on  Cardiac: S1 and S2 present. No murmurs Resp: Lung sounds clear and equal B/L Extremities: Warm and dry.  No edema.  Neuro: Alert and oriented x3. Affect pleasant.  Telemetry   AF 70s (personally reviewed)  Labs    CBC Recent Labs    02/13/24 0513 02/14/24 0548  WBC 13.4* 14.4*  HGB 10.9* 10.5*  HCT 35.5* 33.7*  MCV 95.2 88.2  PLT 355 321   Basic Metabolic Panel Recent Labs    89/71/74 0637 02/14/24 0548  NA 144 145  K 3.6 4.1  CL 105 107  CO2 28 27  GLUCOSE 157* 148*  BUN 86* 80*  CREATININE 1.72* 1.79*  CALCIUM  9.2 9.4  MG 2.2 2.2  PHOS 4.1 4.1   Liver Function Tests Recent Labs    02/12/24 0432 02/13/24 0637 02/14/24 0548  AST 17  --   --   ALT 20  --   --   ALKPHOS  95  --   --   BILITOT 0.5  --   --   PROT 6.4*  --   --   ALBUMIN  1.5*  1.5* 1.7* 1.7*   BNP (last 3 results) Recent Labs    03/09/23 1544 03/11/23 0218 06/27/23 1116  BNP 660.9* 491.3* 646.4*   Hemoglobin A1C No results for input(s): HGBA1C in the last 72 hours.  Fasting Lipid Panel Recent Labs    02/12/24 0432  TRIG 41   Medications:    Scheduled Medications:  bisacodyl   10 mg Rectal Q0600   Chlorhexidine  Gluconate Cloth  6 each Topical Daily   HYDROmorphone    Intravenous Q4H   ipratropium-albuterol   3 mL Nebulization BID   lidocaine   1 patch Transdermal Q24H   pantoprazole  (PROTONIX ) IV  40 mg Intravenous Q12H   sodium chloride  flush  10 mL Intrapleural Q8H   sodium chloride  flush  10-40 mL Intracatheter Q12H    Infusions:  amiodarone  30 mg/hr (02/14/24 0600)   fluconazole (DIFLUCAN) IV 200 mg (02/14/24 0855)   furosemide  160 mg (02/14/24 0851)   heparin  1,950 Units/hr (02/14/24 0719)   piperacillin -tazobactam (ZOSYN )  IV 12.5 mL/hr at 02/14/24 0600  TPN ADULT (ION) 70 mL/hr at 02/14/24 0600   TPN ADULT (ION)      PRN Medications: diphenhydrAMINE **OR** diphenhydrAMINE, [name] butt cream, hydrALAZINE , levalbuterol, LORazepam , naloxone  **AND** sodium chloride  flush, ondansetron  (ZOFRAN ) IV, mouth rinse, sodium chloride  flush  Patient Profile    Patient with longstanding history of chronic heart failure with preserved ejection fraction, RV failure with history of pulmonary embolism presents with esophageal perforation.  S/P Stent Esophageal Stent. Stent repositioned 10/15.  Assessment/Plan   1.  Esophageal Perforation - Boerhaave syndrome.  - CT C/A/P: with pneumomediastinum and large mass-like density in lower esophagus - 10/25 S/P EGD with esophageal stent placed  - Stent migrated w/ leak>back to OR 10/15 for repositioning  - Barium swallow 10/24 persistent leak  - On TPN; Strict NPO - Continues on fluconazole; abx escalated d/t PNA, now on  Zosyn  + Meropenum. Primary managing.  2. Acute on chronic HFpEF: h/o cardiogenic shock with RV failure in the setting of cardiac arrest thought to be caused by acute PE vs ACS in 3/25. Echo at the time showed EF 55%, severe, LVH, G2DD, and severely reduced RV function with strain. There was concern for cardiac sarcoid amyloid, however CMR LGE consistent with ischemic disease. RV on echo 9/25 with complete recovery, EF 60-65%, nl RV function.    - RHC 3/25: Mild PAH with severe RV failure though CO is preserved.   - Repeat echo 10/24 EF 60-65% - Euvolemic on exam. Weight stable. - continue lasix  160 mg daily with TPN to keep even - Continue TED hose  3. 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 Medical management.  - suspect CP related to esophageal issues and not CAD - strict NPO, holding PO meds   4.  AKI on CKD Stage 3b: Baseline sCr ~ 2.1. - Creatinine peaked to 2.6.  - overall improved, SCr stable ~1.7     5. PAF:   - s/p TEE/DCCV 3/25 - has converted to AF a couple times this admit - in NSR on tele   - continue IV amio (NPO) and heparin   6.  H/o DVT/PE:  Bilateral upper and lower DVTs, mixed acute and chronic.  - s/p infrarenal IVC placement 3/25 - V/Q 3/25 with multiple perfusion defects - continue IV heparin . Will need lifelong AC, transition to Eliquis  once taking PO - no change   7. Carotid stenosis: h/o CVA. TCAR in 6/24. Okay to remain off plavix  at this point. - carotid US  9/25: widely patent carotid stent on R and no stenosis on L   8. Acute Hypoxic Respiratory Failure/PNA/ Exudative Lt Pleural Effusion  - on zosyn  and merom - CT placed for pleural effusion. Resp Cx w/ rare yeast, BCx NGTD  - remains on [place]  9. Severe protein calorie malnutrition - Nutrition following. On TPN   Jordan Lee, NP 02/14/2024  Advanced Heart Failure Team Pager [phone] (M-F; 7a - 5p)  Please contact   Cardiology for night-coverage after hours (4p -7a ) and weekends on [email or web address]  Patient seen and examined with the above-signed Advanced Practice Provider and/or Housestaff. I personally reviewed laboratory data, imaging studies and relevant notes. I independently examined the patient and formulated the important aspects of the plan. I have edited the note to reflect any of my changes or salient points. I have personally discussed the plan with the patient and/or family.  Remains weak. On TPN. Working with PT/OT.  Remains in NSR. Volume stable on daily iV lasix   General:  Sitting in chair No resp difficulty HEENT: normal Neck: supple. no JVD.  Cor: Regular rate & rhythm. No rubs, gallops or murmurs. Lungs: clear  + CTs x 2 Abdomen: soft, nontender, nondistended.Good bowel sounds. Extremities: no cyanosis, clubbing, rash, tr  edema Neuro: alert & orientedx3, cranial nerves grossly intact. moves all 4 extremities w/o difficulty. Affect pleasant  Stable from cardiac perspective. Continue current regimen. Switch lasix  to po when taking pos .  Toribio Fuel, MD  1:51 PM

## 2024-02-14 NOTE — Progress Notes (Signed)
 PHARMACY - ANTICOAGULATION CONSULT NOTE  Pharmacy Consult for heparin  infusion Indication: atrial fibrillation  Allergies  Allergen Reactions   Norvasc  [Amlodipine ] Swelling and Other (See Comments)    Excessive BLE swelling Skin discoloration, blotching   Jardiance  [Empagliflozin ] Itching and Other (See Comments)    Patient mentioned penile swelling, pain   Morphine And Codeine Itching    Opioid-induced pruritus    Patient Measurements: Height: 6' 1 (185.4 cm) Weight: 70.8 kg (156 lb) IBW/kg (Calculated) : 79.9 HEPARIN  DW (KG): 80.6  Vital Signs: Temp: 97.6 F (36.4 C) (10/29 0700) Temp Source: Oral (10/29 0700) BP: 149/92 (10/29 0700) Pulse Rate: 64 (10/29 0700)  Labs: Recent Labs    02/12/24 0432 02/13/24 0512 02/13/24 0513 02/13/24 0637 02/14/24 0548 02/14/24 0549  HGB 9.8*  --  10.9*  --  10.5*  --   HCT 31.3*  --  35.5*  --  33.7*  --   PLT 338  --  355  --  321  --   HEPARINUNFRC 0.50 0.37  --   --   --  0.42  CREATININE 1.67*  1.66*  --   --  1.72* 1.79*  --     Estimated Creatinine Clearance: 37.9 mL/min (A) (by C-G formula based on SCr of 1.79 mg/dL (H)).   Assessment: 73 yo M presenting for esophageal rupture, now s/p esophageal stent placement on 10/6. PMH significant for HFrEF, Afib on Eliquis  PTA. Last dose of Eliquis  01/20/24, time unknown. Pharmacy consulted for heparin  management.  10/15 - underwent EGD with stent reposition on 10/15 after finding esophageal leak  Heparin  level this AM 0.42, therapeutic at heparin  1950 units/hr. No issues with infusion running or signs of bleeding per RN. CBC stable (Hgb 10.5, PLT 321).  Pt remains NPO  Goal of Therapy:  Heparin  level 0.3-0.5 units/ml Monitor platelets by anticoagulation protocol: Yes   Plan:  Continue IV heparin  to 1950 units/hr Monitor heparin  level daily Monitor CBC and s/sx of bleeding daily  Harlene Barlow, Berdine [employer]ONETTA [employer], [facility] Clinical Pharmacist  02/14/2024 10:18 AM   [facility]  pharmacy phone numbers are listed on [email or web address]

## 2024-02-14 NOTE — Progress Notes (Signed)
 PHARMACY - TOTAL PARENTERAL NUTRITION CONSULT NOTE   Indication: intolerance to enteral feedings, esophageal perforation  Patient Measurements: Height: 6' 1 (185.4 cm) Weight: 70.8 kg (156 lb) IBW/kg (Calculated) : 79.9 TPN AdjBW (KG): 80.6 Body mass index is 20.58 kg/m.  Assessment:  73 year old man with medical history significant for cardiac arrest earlier this year who was admitted after several days of nausea and vomiting followed by sudden onset severe tearing epigastric pain radiating to back and chest. 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 Pharmacy consulted to initiate TPN due to inability to tolerate enteral nutrition at this time.  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 PTA takes torsemide  20 mg PO daily, now held. Aggressive fluid resuscitation recommended per GI. Per HF, patient currently euvolemic but will hold diuretic for now. Due to CHF will target lower end of fluid range for TPN and supplement additional fluids per MD outside of TPN to prevent fluid overload.   Stent placed on 10/6, esophogram not showing any leak. Diet advanced to CLD. 10/15 Leak present and made NPO. 10/24 esophagram with persistent leak; will need stent replacement in 3-4 weeks.   10/28 AM update:  Patient has weights that are significantly more than admission weight of 72.7 kg on 10/5. This may represent false weights from bed scale.  Current weight is 71.6 kg  Glucose / Insulin : A1C 6.0% (02/2023);  BG <140, 13 units insulin  in TPN  Electrolytes: Na 145, Cl 107, K 4.1, CO2 27 Ca 9.4 [CoCa 11.24], Phos 4.1, Mg 2.2 Renal: Scr 1.79 (BL ~2), BUN 80 Hepatic: AST/ALT/tbili wnl, Alk Phos 189, alb 1.7, TG 41 Intake / Output; MIVF: furosemide  160 mg IV daily starting 10/28; UOP 1.85 mL/kg/hr, chest tube 60 ml, LBM 10/27 Net IO Since Admission: 6,195.96 mL [02/14/24 0907]  GI Imaging: 10/05 CT: concerning for esophageal perforation, masslike area of  soft tissue thickening adjacent to and surrounding the esophagus  10/06 Esoph XR: distal esophageal perforation 10/7 Esoph XR: No leak associated with stent coverage of esophageal perforation 10/14 KUB: Gas-filled loops of small bowel and colon suggestive of ileus 10/15 Esoph XR: esophageal perforation/leak at the distal esophagus, proximal to the esophageal stent 10/24 esophagram: persistent leak despite being covered by the esophageal stent.  GI Surgeries / Procedures:  10/6: esophageal stent placement  10/15 EGD and stent repositioning   Central access: 01/22/24 TPN start date: 01/22/24  Nutritional Goals:  Goal concentrated TPN is 70 ml/hr (provides 116 g of protein and 2301 kcals per day  RD Assessment:  Estimated Needs Total Energy Estimated Needs: 2300-2500 kcals Total Protein Estimated Needs: 115-130 g Total Fluid Estimated Needs: 2L  Current Nutrition:  TPN  10/13- 10/14 DYS1: not taking any po  10/15: NPO d/t increased abd distension  10/22 Propofol  at 7.6- 20.2 ml/hr, providing ~370 kcals/24hr- OFF 10/23: switch propofol  back to dexmedetomidine - OFF   Plan:  Continue concentrated TPN at goal 70 mL/hr, providing 100% estimated nutritional needs   Electrolytes in TPN: Na 0 mEq/L, K 45 mEq/L, Ca 0 mEq/L, Mg 0 mEq/L, Phos 10 mmol/L. Cl:Ac max acetate (cannot do 1:1 or 1:2 due to no Na in bag ) Continue standard MVI and trace elements to TPN Continue 13 units insulin  regular to TPN Monitor TPN labs on Mon/Thurs, and PRN    Thank you for involving pharmacy in this patient's care.   Benedetta Heath BS, PharmD, BCPS Clinical Pharmacist 02/14/2024 6:51 AM  Contact: [phone] after  3 PM

## 2024-02-14 NOTE — Plan of Care (Signed)

## 2024-02-14 NOTE — Progress Notes (Signed)
 PROGRESS NOTE  Javohn Basey FMW:[id number] DOB: 08-30-1950   PCP: Seabron Lenis, MD  Patient is from: Home.  DOA: 01/21/2024 LOS: 24  Chief complaints Chief Complaint  Patient presents with   Abdominal Pain   Emesis     Brief Narrative / Interim history: 73 year old M with PMH of cardiac arrest earlier this year along with HFrEF, Afib, PE presented with several days of nausea and vomiting from a stomach bug followed by sudden onset severe tearing epigastric pain radiating to back and chest. Workup revealed esophageal tear/Boerhaave.  Patient had esophageal stent placed on 10/7.  Subsequent swallow study revealed an additional leak, and he had stent repositioned on 01/31/2024.  He became hypoxic with gastric contents noted on intubation, and he was transferred to ICU for mechanical ventilation.  Bedside ultrasound with right pleural effusion for which he had chest tube placed with removal of 300 cc exudative fluid.  He was also successfully extubated on 10/16.  Patient has had increased oxygen  requirement requiring reintubation on 10/20.  Bedside ultrasound showed left-sided pleural effusion and he had left-sided chest tube with 120 cc exudative output. ICU stay complicated by high O2 requirement, left sided pleural effusion, chest tube placement 10/16, reintubated 10/20.  Eventually, patient was extubated and transferred out of ICU on 10/28.  Hospital course complicated by acute on chronic HFpEF for which she is on IV Lasix  per advanced heart failure team.   Subjective: Seen and examined earlier this morning.  No major events overnight or this morning.  Endorses a little pain in his left chest but rates his pain 11/10 on a scale of 10.  Asking for his Dilaudid  can be increased.  Reports some shortness of breath that improved with breathing treatment.  Denies nausea or vomiting.  Denies abdominal pain.  Patient's son at bedside.   Assessment and plan: Esophageal rupture/ Boerhaave  syndrome-likely from excessive vomiting -S/p stent placement on 10/7 -S/p stent repositioning on 10/16 due to leakage -Esophagogram 10/24 shows persistent leak -N.p.o. on TPN for nutrition. -Cardiothoracic following.  -Chest tube drainage. -Now on IV Zosyn  per cardiothoracic surgery. -Stent replacement every 3-4 weeks.    Acute hypoxic/ hypercapnic respiratory failure-multifactorial including aspiration pneumonia, pleural effusion and CHF.  Trach aspirate on 10/15 with Candida albicans. -IV meropenem>> IV Zosyn  for aspiration pneumonia. -On Diflucan for Candida albicans from trach aspirate 10/15>> -Chest tube for pleural effusion per CTS. -IV Lasix  per advanced heart failure team -Continue bronchodilators   Acute on chronic diastolic CHF-Limited TTE on 10/23 with LVEF of 60 to 65%.  RHC in 3/25 with mild PAH with severe RV failure but preserved CO.  Appears euvolemic on exam. -On IV Lasix  160 mg daily per advanced heart failure team. 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  AKI seems to have resolved. -Continue monitoring   Paroxysmal A-fib: s/p TEE DCCV 3/25.  Seems to be in sinus rhythm. -Amiodarone  drip per cardiology -On heparin  for anticoagulation -Optimize electrolyte   Intractable pain-reports a little pain in his left chest at chest tube site but rates his pain 11/10.  Required ketamine while in ICU.  Currently on IV Dilaudid  PCA. -Continue Dilaudid  PCA.  Essential hypertension: SBP now in 140s.  Partly due to pain. -Pain control as above -Diuretics as above. - IV hydralazine  as needed  Anxiety disorder: Stable. -Ativan  as needed   Acute on chronic anemia -Monitor H&H.   H/o PE/ DVT-history of IVC filter -On heparin  drip  currently.  Leukocytosis: Stable. - Antibiotics as above.  GERD - IV PPI  Severe protein calorie malnutrition/hypoalbuminemia Body mass index is 20.58 kg/m. Nutrition  Problem: Severe Malnutrition Etiology: acute illness (may have chronic component due to time frame of reported wt loss) Signs/Symptoms: severe fat depletion, moderate muscle depletion Interventions: Refer to RD note for recommendations  Pressure skin injury Wound 02/07/24 0900 Pressure Injury Foot Anterior;Right Stage 1 -  Intact skin with non-blanchable redness of a localized area usually over a bony prominence. (Active)     Wound 02/07/24 0900 Pressure Injury Foot Anterior;Left Stage 1 -  Intact skin with non-blanchable redness of a localized area usually over a bony prominence. (Active)     Wound 02/07/24 0900 Pressure Injury Buttocks Right Stage 1 -  Intact skin with non-blanchable redness of a localized area usually over a bony prominence. (Active)   DVT prophylaxis:  SCDs Start: 01/21/24 2023  Code Status: Full code Family Communication: Updated patient's son at bedside. Level of care: Telemetry Cardiac Status is: Inpatient Remains inpatient appropriate because: Esophageal rupture, acute CHF,   Final disposition: LTAC?   55 minutes with more than 50% spent in reviewing records, counseling patient/family and coordinating care.  Consultants:  Gastroenterology Critical care Cardiology Cardiothoracic surgery  Procedures: See above.  Microbiology summarized: 10/5-MRSA PCR screen positive 10/15-trach aspirate culture with Candida albicans.  Objective: Vitals:   02/14/24 0700 02/14/24 0721 02/14/24 1006 02/14/24 1136  BP: (!) 149/92   (!) 149/73  Pulse: 64   68  Resp: 18 18 (!) 22 (!) 22  Temp: 97.6 F (36.4 C)   98.4 F (36.9 C)  TempSrc: Oral   Axillary  SpO2: 100%  98% 97%  Weight:      Height:        Examination:  GENERAL: No apparent distress.  Nontoxic. HEENT: MMM.  Vision and hearing grossly intact.  NECK: Supple.  No apparent JVD.  RESP:  No IWOB.  Fair aeration bilaterally.  Chest tube to left chest. CVS:  RRR. Heart sounds normal.  ABD/GI/GU:  BS+. Abd soft, NTND.  MSK/EXT:  Moves extremities. No apparent deformity. No edema.  SKIN: no apparent skin lesion or wound NEURO: AA.  Oriented appropriately.  No apparent focal neuro deficit. PSYCH: Calm. Normal affect.   Sch Meds:  Scheduled Meds:  bisacodyl   10 mg Rectal Q0600   Chlorhexidine  Gluconate Cloth  6 each Topical Daily   HYDROmorphone    Intravenous Q4H   ipratropium-albuterol   3 mL Nebulization BID   lidocaine   1 patch Transdermal Q24H   pantoprazole  (PROTONIX ) IV  40 mg Intravenous Q12H   sodium chloride  flush  10 mL Intrapleural Q8H   sodium chloride  flush  10-40 mL Intracatheter Q12H   Continuous Infusions:  amiodarone  30 mg/hr (02/14/24 1236)   fluconazole (DIFLUCAN) IV 200 mg (02/14/24 0855)   furosemide  160 mg (02/14/24 0851)   heparin  1,950 Units/hr (02/14/24 0719)   piperacillin -tazobactam (ZOSYN )  IV 12.5 mL/hr at 02/14/24 0600   TPN ADULT (ION) 70 mL/hr at 02/14/24 0600   TPN ADULT (ION)     PRN Meds:.diphenhydrAMINE **OR** diphenhydrAMINE, [name] butt cream, hydrALAZINE , levalbuterol, LORazepam , naloxone  **AND** sodium chloride  flush, ondansetron  (ZOFRAN ) IV, mouth rinse, sodium chloride  flush  Antimicrobials: Anti-infectives (From admission, onward)    Start     Dose/Rate Route Frequency Ordered Stop   02/13/24 0900  piperacillin -tazobactam (ZOSYN ) IVPB 3.375 g        3.375 g 12.5 mL/hr over 240 Minutes Intravenous Every 8  hours 02/13/24 0812     02/07/24 0800  vancomycin (VANCOCIN) IVPB 1000 mg/200 mL premix  Status:  Discontinued        1,000 mg 200 mL/hr over 60 Minutes Intravenous Every 24 hours 02/07/24 0632 02/09/24 0905   02/06/24 2200  meropenem (MERREM) 1 g in sodium chloride  0.9 % 100 mL IVPB  Status:  Discontinued        1 g 200 mL/hr over 30 Minutes Intravenous Every 12 hours 02/06/24 1127 02/13/24 0812   02/06/24 1300  vancomycin (VANCOREADY) IVPB 1750 mg/350 mL  Status:  Discontinued        1,750 mg 175 mL/hr over 120 Minutes  Intravenous Every 48 hours 02/04/24 1225 02/04/24 1226   02/06/24 0800  vancomycin (VANCOCIN) IVPB 1000 mg/200 mL premix        1,000 mg 200 mL/hr over 60 Minutes Intravenous  Once 02/06/24 0629 02/07/24 0829   02/04/24 1400  meropenem (MERREM) 1 g in sodium chloride  0.9 % 100 mL IVPB  Status:  Discontinued        1 g 200 mL/hr over 30 Minutes Intravenous Every 8 hours 02/04/24 1208 02/06/24 1127   02/04/24 1300  linezolid  (ZYVOX ) IVPB 600 mg  Status:  Discontinued        600 mg 300 mL/hr over 60 Minutes Intravenous Every 12 hours 02/04/24 1202 02/04/24 1208   02/04/24 1300  vancomycin (VANCOREADY) IVPB 2000 mg/400 mL        2,000 mg 200 mL/hr over 120 Minutes Intravenous  Once 02/04/24 1208 02/04/24 1623   02/04/24 1208  vancomycin variable dose per unstable renal function (pharmacist dosing)  Status:  Discontinued         Does not apply See admin instructions 02/04/24 1208 02/07/24 0632   01/31/24 1030  fluconazole (DIFLUCAN) IVPB 200 mg        200 mg 100 mL/hr over 60 Minutes Intravenous Daily 01/31/24 0938     01/30/24 1445  piperacillin -tazobactam (ZOSYN ) IVPB 3.375 g  Status:  Discontinued        3.375 g 12.5 mL/hr over 240 Minutes Intravenous Every 8 hours 01/30/24 1354 02/04/24 1203   01/26/24 1000  amoxicillin-clavulanate (AUGMENTIN) 400-57 MG/5ML suspension 875 mg  Status:  Discontinued        875 mg Oral Every 12 hours 01/26/24 0658 01/30/24 1354   01/26/24 1000  fluconazole (DIFLUCAN) 40 MG/ML suspension 200 mg  Status:  Discontinued        200 mg Oral Daily 01/26/24 0658 01/31/24 0938   01/22/24 1445  fluconazole (DIFLUCAN) IVPB 400 mg  Status:  Discontinued        400 mg 100 mL/hr over 120 Minutes Intravenous Every 24 hours 01/22/24 1436 01/22/24 1439   01/22/24 1445  fluconazole (DIFLUCAN) IVPB 200 mg  Status:  Discontinued        200 mg 100 mL/hr over 60 Minutes Intravenous Every 24 hours 01/22/24 1439 01/26/24 0658   01/21/24 2200  piperacillin -tazobactam (ZOSYN ) IVPB  3.375 g  Status:  Discontinued       Placed in Followed by Linked Group   3.375 g 12.5 mL/hr over 240 Minutes Intravenous Every 8 hours 01/21/24 1607 01/26/24 0658   01/21/24 1615  piperacillin -tazobactam (ZOSYN ) IVPB 3.375 g       Placed in Followed by Linked Group   3.375 g 100 mL/hr over 30 Minutes Intravenous  Once 01/21/24 1607 01/21/24 1651        I have personally reviewed the  following labs and images: CBC: Recent Labs  Lab 02/09/24 0411 02/11/24 1110 02/12/24 0432 02/13/24 0513 02/14/24 0548  WBC 11.4* 12.7* 11.9* 13.4* 14.4*  HGB 9.1* 9.3* 9.8* 10.9* 10.5*  HCT 29.1* 29.8* 31.3* 35.5* 33.7*  MCV 87.7 88.2 88.4 95.2 88.2  PLT 416* 347 338 355 321   BMP &GFR Recent Labs  Lab 02/10/24 0500 02/11/24 0953 02/12/24 0432 02/13/24 0637 02/14/24 0548  NA 146* 147* 147*  147* 144 145  K 3.7 4.0 3.9  3.9 3.6 4.1  CL 106 107 107  106 105 107  CO2 29 28 30  29 28 27   GLUCOSE 154* 150* 137*  136* 157* 148*  BUN 96* 82* 83*  83* 86* 80*  CREATININE 1.55* 1.51* 1.67*  1.66* 1.72* 1.79*  CALCIUM  8.6* 8.7* 9.3  9.2 9.2 9.4  MG 2.2 2.2 2.2 2.2 2.2  PHOS 2.9 3.3 4.2  4.3 4.1 4.1   Estimated Creatinine Clearance: 37.9 mL/min (A) (by C-G formula based on SCr of 1.79 mg/dL (H)). Liver & Pancreas: Recent Labs  Lab 02/08/24 0457 02/09/24 0410 02/10/24 0500 02/11/24 0953 02/12/24 0432 02/13/24 0637 02/14/24 0548  AST 33  --   --   --  17  --   --   ALT 40  --   --   --  20  --   --   ALKPHOS 108  --   --   --  95  --   --   BILITOT 0.7  --   --   --  0.5  --   --   PROT 5.6*  --   --   --  6.4*  --   --   ALBUMIN  <1.5*  <1.5*   < > <1.5* <1.5* 1.5*  1.5* 1.7* 1.7*   < > = values in this interval not displayed.   No results for input(s): LIPASE, AMYLASE in the last 168 hours. No results for input(s): AMMONIA in the last 168 hours. Diabetic: No results for input(s): HGBA1C in the last 72 hours. Recent Labs  Lab 02/11/24 2312 02/12/24 0442  02/12/24 0725 02/12/24 1120 02/12/24 1601  GLUCAP 145* 130* 131* 130* 137*   Cardiac Enzymes: No results for input(s): CKTOTAL, CKMB, CKMBINDEX, TROPONINI in the last 168 hours. No results for input(s): PROBNP in the last 8760 hours. Coagulation Profile: No results for input(s): INR, PROTIME in the last 168 hours. Thyroid  Function Tests: No results for input(s): TSH, T4TOTAL, FREET4, T3FREE, THYROIDAB in the last 72 hours. Lipid Profile: Recent Labs    02/12/24 0432  TRIG 41   Anemia Panel: No results for input(s): VITAMINB12, FOLATE, FERRITIN, TIBC, IRON , RETICCTPCT in the last 72 hours. Urine analysis:    Component Value Date/Time   COLORURINE YELLOW 07/04/2023 1141   APPEARANCEUR HAZY (A) 07/04/2023 1141   LABSPEC 1.010 07/04/2023 1141   PHURINE 5.0 07/04/2023 1141   GLUCOSEU NEGATIVE 07/04/2023 1141   HGBUR NEGATIVE 07/04/2023 1141   BILIRUBINUR NEGATIVE 07/04/2023 1141   KETONESUR NEGATIVE 07/04/2023 1141   PROTEINUR NEGATIVE 07/04/2023 1141   UROBILINOGEN 0.2 08/31/2019 1136   NITRITE NEGATIVE 07/04/2023 1141   LEUKOCYTESUR NEGATIVE 07/04/2023 1141   Sepsis Labs: Invalid input(s): PROCALCITONIN, LACTICIDVEN  Microbiology: Recent Results (from the past 240 hours)  Body fluid culture w Gram Stain     Status: None   Collection Time: 02/05/24  3:10 AM   Specimen: Pleural Fluid  Result Value Ref Range Status   Specimen Description PLEURAL  Final   Special Requests NONE  Final   Gram Stain NO WBC SEEN NO ORGANISMS SEEN   Final   Culture   Final    NO GROWTH 3 DAYS Performed at [facility], [address] [place], [place] [address]    Report Status 02/08/2024 FINAL  Final  Fungus Culture With Stain     Status: None (Preliminary result)   Collection Time: 02/05/24  3:10 AM   Specimen: Pleural Fluid  Result Value Ref Range Status   Fungus Stain Final report  Final    Comment: (NOTE) Performed At: [facility] [address] [place], [place] [id number] Jennette Shorter MD Ey:[id number]    Fungus (Mycology) Culture PENDING  Incomplete   Fungal Source PLEURAL  Final    Comment: Performed at [facility], [address], [place], [place] [address]  Fungus Culture Result     Status: None   Collection Time: 02/05/24  3:10 AM  Result Value Ref Range Status   Result 1 Comment  Final    Comment: (NOTE) KOH/Calcofluor preparation:  no fungus observed. Performed At: [facility] [address] [place], [place] [id number] Jennette Shorter MD Ey:[phone]     Radiology Studies: No results found.    Veroncia Jezek T. Neyah Ellerman [facility]  If 7PM-7AM, please contact night-coverage [email or web address] 02/14/2024, 12:36 PM

## 2024-02-14 NOTE — Progress Notes (Signed)
 Physical Therapy Treatment Patient Details Name: Billy [name] MRN: [id number] DOB: 12-27-1950 Today's Date: 02/14/2024   History of Present Illness Billy [name] is a 73 y.o. male who presented to [facility] ED 01/21/24 with several days of vomiting followed by an episode of severe tearing epigastric pain. Work-up revealed esophageal tear. Pt s/p esophageal stent with EGD 10/7. Return to OR for esophageal stent adjustment on 10/15, remained intubated after procedure due to suspected aspiration. Extubated 10/16.  Pt had CT placed 10/16. Reintubated 10/20-10/24. PMHx: HFrEF, AFib, CAD, PE complicated by cardiogenic shock, CKD 4, MI, peripheral neuropathy, and TIA.    PT Comments  Pt is progressing towards goals. Currently pt is 2 person Min a for sit to stand and CGA to Min A +2 for gait with 3rd person to help with lines/chair follow for safety. Pt fatigues quickly. Motivated to improve and get back to normal life per pt. Due to pt current functional status, home set up and available assistance at home recommending skilled physical therapy services < 3 hours/day in order to address strength, balance and functional mobility to decrease risk for falls, injury, immobility, skin break down and re-hospitalization.      If plan is discharge home, recommend the following: Assistance with cooking/housework;Assist for transportation;Help with stairs or ramp for entrance;A lot of help with bathing/dressing/bathroom;Direct supervision/assist for medications management;Direct supervision/assist for financial management;Supervision due to cognitive status;A lot of help with walking and/or transfers     Equipment Recommendations  Wheelchair (measurements PT);Wheelchair cushion (measurements PT)       Precautions / Restrictions Precautions Precautions: Fall Recall of Precautions/Restrictions: Impaired Precaution/Restrictions Comments: BIL chest tubes in place; Restrictions Weight Bearing Restrictions Per Provider Order: No      Mobility  Bed Mobility   General bed mobility comments: OOB in chair upon arrival and departure    Transfers Overall transfer level: Needs assistance Equipment used: Rolling walker (2 wheels) Transfers: Sit to/from Stand Sit to Stand: Min assist, +2 safety/equipment, +2 physical assistance           General transfer comment: Min A +2 to get tos tanding. Pt with flexed posture.    Ambulation/Gait Ambulation/Gait assistance: Contact guard assist, Min assist Gait Distance (Feet): 30 Feet Assistive device: Rolling walker (2 wheels) Gait Pattern/deviations: Step-through pattern, Decreased stride length, Trunk flexed, Shuffle Gait velocity: decreased Gait velocity interpretation: <1.31 ft/sec, indicative of household ambulator   General Gait Details: Shuffling gait pattern with partial step through, low foot clearance, kyphotic posture, occasional verbal cues to maintain RW within close distance to prevent anterior LOB.     Balance Overall balance assessment: Needs assistance Sitting-balance support: Feet supported, Bilateral upper extremity supported Sitting balance-Leahy Scale: Fair Sitting balance - Comments: sits on edge of chair without back support   Standing balance support: During functional activity, Bilateral upper extremity supported, Reliant on assistive device for balance Standing balance-Leahy Scale: Poor Standing balance comment: reliant on external support         Cognition Arousal: Alert Behavior During Therapy: Flat affect   PT - Cognitive impairments: Attention, Sequencing, Problem solving   PT - Cognition Comments: Delayed processing, doesn't consistently respond to questions Following commands: Impaired Following commands impaired: Follows one step commands with increased time, Follows one step commands inconsistently    Cueing Cueing Techniques: Verbal cues, Gestural cues, Tactile cues     General Comments General comments (skin integrity,  edema, etc.): Vital signs stable on 2L o2 via [place] respirations up to 39, difficult to get  O2 sats reading intermittently but with good pleth up in the 90's      Pertinent Vitals/Pain Pain Assessment Pain Assessment: Faces Faces Pain Scale: Hurts little more Facial Expression: Relaxed, neutral Body Movements: Absence of movements Muscle Tension: Tense, rigid Compliance with ventilator (intubated pts.): N/A Vocalization (extubated pts.): Sighing, moaning CPOT Total: 2 Pain Location: did not specify Pain Descriptors / Indicators: Discomfort Pain Intervention(s): Monitored during session, Limited activity within patient's tolerance     PT Goals (current goals can now be found in the care plan section) Acute Rehab PT Goals Patient Stated Goal: Return Home PT Goal Formulation: With patient Time For Goal Achievement: 02/16/24 Potential to Achieve Goals: Good Progress towards PT goals: Progressing toward goals    Frequency    Min 2X/week      PT Plan  Continue with current POC        AM-PAC PT 6 Clicks Mobility   Outcome Measure  Help needed turning from your back to your side while in a flat bed without using bedrails?: A Little Help needed moving from lying on your back to sitting on the side of a flat bed without using bedrails?: A Lot Help needed moving to and from a bed to a chair (including a wheelchair)?: A Lot Help needed standing up from a chair using your arms (e.g., wheelchair or bedside chair)?: A Lot Help needed to walk in hospital room?: A Lot Help needed climbing 3-5 steps with a railing? : Total 6 Click Score: 12    End of Session Equipment Utilized During Treatment: Oxygen ;Gait belt (up by axilla) Activity Tolerance: Patient limited by fatigue Patient left: with call bell/phone within reach;in chair;with chair alarm set Nurse Communication: Mobility status PT Visit Diagnosis: Difficulty in walking, not elsewhere classified (R26.2);Unsteadiness on feet  (R26.81);Other abnormalities of gait and mobility (R26.89);Muscle weakness (generalized) (M62.81);Other symptoms and signs involving the nervous system (R29.898);Pain Pain - part of body:  (abdomen)     Time: 8844-8761 PT Time Calculation (min) (ACUTE ONLY): 43 min  Charges:    $Therapeutic Activity: 38-52 mins PT General Charges $$ ACUTE PT VISIT: 1 Visit                    Dorothyann Maier, DPT, CLT  Acute Rehabilitation Services Office: [phone] (Secure chat preferred)    Dorothyann VEAR Maier 02/14/2024, 1:34 PM

## 2024-02-14 NOTE — Progress Notes (Signed)
 Nutrition Follow-up  DOCUMENTATION CODES:   Severe malnutrition in context of acute illness/injury  INTERVENTION:    Continue TPN to meet 100% estimated nutritional needs -Recommend considering increasing free water  in TPN (un-concentrating) -Continue MVI w/ trace elements added to TPN  NUTRITION DIAGNOSIS:  Severe Malnutrition related to acute illness (may have chronic component due to time frame of reported wt loss) as evidenced by severe fat depletion, moderate muscle depletion.  GOAL:  Patient will meet greater than or equal to 90% of their needs   MONITOR:  Diet advancement, Labs, Weight trends, I & O's, Skin (TPN)  REASON FOR ASSESSMENT:   Consult, Rounds New TPN/TNA  ASSESSMENT:   73 yo male admitted post several days of N/V followed by sudden onset of severe epigastric pain radiating to chest and back. Pt found to have small esophageal perforation above GE junction. PMH includes CAD, chronic HFpEF, HTN, HLD, DVT, CVA, PAF, CKD 3b.  10/05 Admitted 10/06 Esophagogram/barium study: distal esophageal perforation, TPN started 10/07 S/p Esophageal stent, TPN at goal rate of 80 ml/hr 10/08  CLD 10/10 Diet advanced to Dysphagia 3, Calorie Count 10/13 No meal tickets for calorie count, pt reports eating bites only 10/15 Esophageal stent migration with leak on esophagram with return to OR for stent repositioning, likely aspiration event requiring intubation 10/16 Extubated, Chest tube placed 10/17 Limited echo with EF 50-55%, RV mildly reduced 10/20 Intubated early AM, L pleural effusion, pigtail chest tube inserted 10/23 - extubated; limited echo: 60-65% 10/24 - esophagram: persistent leak 10/28 - transfer out of ICU   Transferred out of ICU on 10/28. Working with therapy at time of attempted assessment this morning. Per chart review, still endorsing pain and requesting increase in his dilaudid  dose. Remains on pain pump.    TPN continues goal rate of 70 ml/hr providing  2301 kcals, 116 g of protein  Admit Weight: 72.8 kg Current Weight: 70.8 kg  CVP 7 this morning. BUN improved down to 80, sodium down trending as well at 145.  Albumin  also trended up to 1.7.  Started scheduled daily IV Lasix  160mg  yesterday, UOP 3.1L in 24 hours.   Corrected calcium  elevated. No calcium  noted in TPN formulation. Likely r/t immobility.   Intake/Output Summary (Last 24 hours) at 02/14/2024 1446 Last data filed at 02/14/2024 1300 Gross per 24 hour  Intake 3014.27 ml  Output 4060 ml  Net -1045.73 ml     Currently on Concentrated TPN. No sodium in current TPN mixture with only 40ml sterile water . Required potassium supplementation yesterday.    Edema improving. Euvolemic.  Drains/Lines: R brachial: PICC, triple lumen R pleural chest tube: 20 ml x 24 hours L pleural chest tube: 20ml x24 hours UOP: 3150 ml x24 hours   Labs: Sodium 145 (wdl) Chloride 109 (wdl) Potassium 4.1 (wdl) Corr Ca 11.2 (H) BUN 80 Creatinine 1.79 Albumin  1.7 Magnesium  2.2 (wdl) Phosphorus 4.1 (wdl) WBC 14.4 (H) CBGs 148-157 x24 hours A1c 6.0 (01/2024)   Meds: Pantoprazole  Amio gtt IV Fluconazole IV ABX IV Lasix   Diet Order:   Diet Order             Diet NPO time specified  Diet effective now                   EDUCATION NEEDS:   Education needs have been addressed  Skin:  Skin Assessment: Skin Integrity Issues: Skin Integrity Issues:: Stage I Stage I: buttocks, bilateral feet  Last BM:  10/27  Height:  Ht Readings from Last 1 Encounters:  02/07/24 6' 1 (1.854 m)   Weight:  Wt Readings from Last 1 Encounters:  02/14/24 70.8 kg   Ideal Body Weight:  83.6 kg  BMI:  Body mass index is 20.58 kg/m.  Estimated Nutritional Needs:   Kcal:  2300-2500 kcals  Protein:  115-130 g  Fluid:  2L  Blair Deaner MS, RD, LDN Registered Dietitian Clinical Nutrition RD Inpatient Contact Info in Amion

## 2024-02-15 DIAGNOSIS — I5023 Acute on chronic systolic (congestive) heart failure: Secondary | ICD-10-CM | POA: Diagnosis not present

## 2024-02-15 DIAGNOSIS — E43 Unspecified severe protein-calorie malnutrition: Secondary | ICD-10-CM | POA: Diagnosis not present

## 2024-02-15 DIAGNOSIS — K223 Perforation of esophagus: Secondary | ICD-10-CM | POA: Diagnosis not present

## 2024-02-15 DIAGNOSIS — J90 Pleural effusion, not elsewhere classified: Secondary | ICD-10-CM | POA: Diagnosis not present

## 2024-02-15 LAB — COMPREHENSIVE METABOLIC PANEL WITH GFR
ALT: 19 U/L (ref 0–44)
AST: 15 U/L (ref 15–41)
Albumin: 1.8 g/dL — ABNORMAL LOW (ref 3.5–5.0)
Alkaline Phosphatase: 95 U/L (ref 38–126)
Anion gap: 16 — ABNORMAL HIGH (ref 5–15)
BUN: 74 mg/dL — ABNORMAL HIGH (ref 8–23)
CO2: 27 mmol/L (ref 22–32)
Calcium: 9.6 mg/dL (ref 8.9–10.3)
Chloride: 102 mmol/L (ref 98–111)
Creatinine, Ser: 1.86 mg/dL — ABNORMAL HIGH (ref 0.61–1.24)
GFR, Estimated: 38 mL/min — ABNORMAL LOW (ref >60)
Glucose, Bld: 146 mg/dL — ABNORMAL HIGH (ref 70–99)
Potassium: 3.9 mmol/L (ref 3.5–5.1)
Sodium: 145 mmol/L (ref 135–145)
Total Bilirubin: 0.5 mg/dL (ref 0.0–1.2)
Total Protein: 7.1 g/dL (ref 6.5–8.1)

## 2024-02-15 LAB — CBC
HCT: 34 % — ABNORMAL LOW (ref 39.0–52.0)
Hemoglobin: 10.7 g/dL — ABNORMAL LOW (ref 13.0–17.0)
MCH: 29.7 pg (ref 26.0–34.0)
MCHC: 31.5 g/dL (ref 30.0–36.0)
MCV: 94.4 fL (ref 80.0–100.0)
Platelets: 291 K/uL (ref 150–400)
RBC: 3.6 MIL/uL — ABNORMAL LOW (ref 4.22–5.81)
RDW: 19.5 % — ABNORMAL HIGH (ref 11.5–15.5)
WBC: 14.8 K/uL — ABNORMAL HIGH (ref 4.0–10.5)
nRBC: 0 % (ref 0.0–0.2)

## 2024-02-15 LAB — MAGNESIUM: Magnesium: 2 mg/dL (ref 1.7–2.4)

## 2024-02-15 LAB — PHOSPHORUS: Phosphorus: 4.1 mg/dL (ref 2.5–4.6)

## 2024-02-15 LAB — HEPARIN LEVEL (UNFRACTIONATED): Heparin Unfractionated: 0.33 [IU]/mL (ref 0.30–0.70)

## 2024-02-15 MED ORDER — TRACE MINERALS CU-MN-SE-ZN 300-55-60-3000 MCG/ML IV SOLN
INTRAVENOUS | Status: AC
  Filled 2024-02-15: qty 772.8

## 2024-02-15 NOTE — Progress Notes (Addendum)
 Advanced Heart Failure Rounding Note  Cardiologist: Dorn Lesches, MD  Chief Complaint: S/p esophageal stent Subjective:    - S/p EGD with stent repositioning by Dr. Shyrl 10/15  - Limited echo 02/08/24 EF 60-65%  - Barium swallow 02/09/24 persistent leak   Still getting TPN, I/Os Net negative yesterday 900 cc. Wt stable. CVP 3   Scr relatively stable at 1.8   Maintaining NSR   Sitting up in chair. Looks better today. No current complaints. Comfortable. No dyspnea.   Objective:    Weight Range: 70.1 kg Body mass index is 20.38 kg/m.   Vital Signs:   Temp:  [97.5 F (36.4 C)-98.7 F (37.1 C)] 97.5 F (36.4 C) (10/30 1117) Pulse Rate:  [64-77] 66 (10/30 1117) Resp:  [13-22] 18 (10/30 1117) BP: (140-157)/(62-71) 155/71 (10/30 1117) SpO2:  [95 %-100 %] 97 % (10/30 1117) FiO2 (%):  [38 %-40 %] 38 % (10/30 0645) Weight:  [70.1 kg] 70.1 kg (10/30 0500) Last BM Date : 02/13/24  Weight change: Filed Weights   02/12/24 0500 02/14/24 0329 02/15/24 0500  Weight: 71.6 kg 70.8 kg 70.1 kg   Intake/Output:  Intake/Output Summary (Last 24 hours) at 02/15/2024 1157 Last data filed at 02/15/2024 0943 Gross per 24 hour  Intake 1950.78 ml  Output 2510 ml  Net -559.22 ml    Physical Exam   General: fatigued appearing, NAD  Cardiac: RRR. No MRG  Resp: clear b/l + CTs  Extremities: warm and dry. No LEE  + RUE PICC  Neuro: A&Ox 3. Affect flat.  Telemetry   NSR 60s (personally reviewed)  Labs    CBC Recent Labs    02/14/24 0548 02/15/24 0430  WBC 14.4* 14.8*  HGB 10.5* 10.7*  HCT 33.7* 34.0*  MCV 88.2 94.4  PLT 321 291   Basic Metabolic Panel Recent Labs    89/70/74 0548 02/15/24 0601  NA 145 145  K 4.1 3.9  CL 107 102  CO2 27 27  GLUCOSE 148* 146*  BUN 80* 74*  CREATININE 1.79* 1.86*  CALCIUM  9.4 9.6  MG 2.2 2.0  PHOS 4.1 4.1   Liver Function Tests Recent Labs    02/14/24 0548 02/15/24 0601  AST  --  15  ALT  --  19  ALKPHOS  --  95   BILITOT  --  0.5  PROT  --  7.1  ALBUMIN  1.7* 1.8*   BNP (last 3 results) Recent Labs    03/09/23 1544 03/11/23 0218 06/27/23 1116  BNP 660.9* 491.3* 646.4*   Hemoglobin A1C No results for input(s): HGBA1C in the last 72 hours.  Fasting Lipid Panel No results for input(s): CHOL, HDL, LDLCALC, TRIG, CHOLHDL, LDLDIRECT in the last 72 hours.  Medications:    Scheduled Medications:  bisacodyl   10 mg Rectal Q0600   Chlorhexidine  Gluconate Cloth  6 each Topical Daily   HYDROmorphone    Intravenous Q4H   lidocaine   1 patch Transdermal Q24H   pantoprazole  (PROTONIX ) IV  40 mg Intravenous Q12H   sodium chloride  flush  10 mL Intrapleural Q8H   sodium chloride  flush  10-40 mL Intracatheter Q12H    Infusions:  amiodarone  30 mg/hr (02/15/24 0943)   fluconazole (DIFLUCAN) IV 200 mg (02/15/24 0946)   furosemide  160 mg (02/15/24 0836)   heparin  1,950 Units/hr (02/15/24 0703)   piperacillin -tazobactam (ZOSYN )  IV 3.375 g (02/15/24 0511)   TPN ADULT (ION) 70 mL/hr at 02/14/24 1754   TPN ADULT (ION)  PRN Medications: diphenhydrAMINE **OR** diphenhydrAMINE, [name] butt cream, hydrALAZINE , levalbuterol, LORazepam , naloxone  **AND** sodium chloride  flush, ondansetron  (ZOFRAN ) IV, mouth rinse, sodium chloride  flush  Patient Profile    Patient with longstanding history of chronic heart failure with preserved ejection fraction, RV failure with history of pulmonary embolism presents with esophageal perforation.  S/P Stent Esophageal Stent. Stent repositioned 10/15.  Assessment/Plan   1.  Esophageal Perforation - Boerhaave syndrome.  - CT C/A/P: with pneumomediastinum and large mass-like density in lower esophagus - 10/25 S/P EGD with esophageal stent placed  - Stent migrated w/ leak>back to OR 10/15 for repositioning  - Barium swallow 10/24 persistent leak  - On TPN; Strict NPO - Continues on fluconazole; abx escalated d/t PNA, now on Zosyn  + Meropenum. Primary  managing.  2. Acute on chronic HFpEF: h/o cardiogenic shock with RV failure in the setting of cardiac arrest thought to be caused by acute PE vs ACS in 3/25. Echo at the time showed EF 55%, severe, LVH, G2DD, and severely reduced RV function with strain. There was concern for cardiac sarcoid amyloid, however CMR LGE consistent with ischemic disease. RV on echo 9/25 with complete recovery, EF 60-65%, nl RV function.    - RHC 3/25: Mild PAH with severe RV failure though CO is preserved.   - Repeat echo 10/24 EF 60-65% - Euvolemic on exam. Weight stable. CVP 3  - continue lasix  160 mg daily with TPN to keep even - switch to PO Lasix  once able to tolerate POs - Continue TED hose  3. 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 Medical management.  - suspect CP related to esophageal issues and not CAD - strict NPO, holding PO meds   4.  AKI on CKD Stage 3b: Baseline sCr ~ 2.1. - Creatinine peaked to 2.6.  - overall improved, SCr stable ~1.8     5. PAF:   - s/p TEE/DCCV 3/25 - has converted to AF a couple times this admit - in NSR on tele   - continue IV amio (NPO) and heparin   6.  H/o DVT/PE:  Bilateral upper and lower DVTs, mixed acute and chronic.  - s/p infrarenal IVC placement 3/25 - V/Q 3/25 with multiple perfusion defects - continue IV heparin . Will need lifelong AC, transition to Eliquis  once taking PO - no change   7. Carotid stenosis: h/o CVA. TCAR in 6/24. Okay to remain off plavix  at this point. - carotid US  9/25: widely patent carotid stent on R and no stenosis on L   8. Acute Hypoxic Respiratory Failure/PNA/ Exudative Lt Pleural Effusion  - on zosyn  and merom - CT placed for pleural effusion. Resp Cx w/ rare yeast, BCx NGTD  - remains on Raymond  9. Severe protein calorie malnutrition - Nutrition following. On TPN   Caffie Shed, PA-C 02/15/2024  Advanced Heart Failure Team Pager [phone] (M-F; 7a -  5p)  Please contact [facility] for night-coverage after hours (4p -7a ) and weekends on [email or web address]   Patient seen and examined with the above-signed Advanced Practice Provider and/or Housestaff. I personally reviewed laboratory data, imaging studies and relevant notes. I independently examined the patient and formulated the important aspects of the plan. I have edited the note to reflect any of my changes or salient points. I have personally discussed the plan with the patient and/or family.  Remains weak. On IVA amio, heparin  and daily IV lasix . Remains NPO On  TPN.   General:  Sitting up in chair No resp difficulty HEENT: normal Neck: supple. no JVD.  Cor: Regular rate & rhythm. No rubs, gallops or murmurs. Lungs: clear Abdomen: soft, nontender, nondistended.Good bowel sounds. Extremities: no cyanosis, clubbing, rash, edema Neuro: alert & orientedx3, cranial nerves grossly intact. moves all 4 extremities w/o difficulty. Affect pleasant  Stable from cardiac perspective. We need tot ry to start using his gut. I spoke with Dr. Shyrl today about timing of possible esophageal stent exchange +/- PEG tube. He will see him to re-evaluate.   Toribio Fuel, MD  1:44 PM

## 2024-02-15 NOTE — Progress Notes (Signed)
 PROGRESS NOTE  Billy [name] FMW:[id number] DOB: [birth year]   PCP: Seabron Lenis, MD  Patient is from: Home.  DOA: 01/21/2024 LOS: 25  Chief complaints Chief Complaint  Patient presents with   Abdominal Pain   Emesis     Brief Narrative / Interim history: 73 year old M with PMH of cardiac arrest earlier this year along with HFrEF, Afib, PE presented with several days of nausea and vomiting from a stomach bug followed by sudden onset severe tearing epigastric pain radiating to back and chest. Workup revealed esophageal tear/Boerhaave.  Patient had esophageal stent placed on 10/7.  Subsequent swallow study revealed an additional leak, and he had stent repositioned on 01/31/2024.  He became hypoxic with gastric contents noted on intubation, and he was transferred to ICU for mechanical ventilation.  Bedside ultrasound with right pleural effusion for which he had chest tube placed with removal of 300 cc exudative fluid.  He was also successfully extubated on 10/16.  Patient has had increased oxygen  requirement requiring reintubation on 10/20.  Bedside ultrasound showed left-sided pleural effusion and he had left-sided chest tube with 120 cc exudative output. ICU stay complicated by high O2 requirement, left sided pleural effusion, chest tube placement 10/16, reintubated 10/20.  Eventually, patient was extubated and transferred out of ICU on 10/28.  Hospital course complicated by acute on chronic HFpEF for which she is on IV Lasix  per advanced heart failure team.   Subjective: Seen and examined earlier this morning.  No major events overnight or this morning.  Reports having fair night.  Sitting on bedside chair.  No complaints.   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 on TPN for nutrition. -Cardiothoracic  following.  -Chest tube drainage. -Now on IV Zosyn  per cardiothoracic surgery. -Stent replacement/exchange every 3-4 weeks.    Acute hypoxic/ hypercapnic respiratory failure-multifactorial including aspiration pneumonia, pleural effusion and CHF.  Trach aspirate on 10/15 with Candida albicans. -IV meropenem>> IV Zosyn  for aspiration pneumonia. -On Diflucan for Candida albicans from trach aspirate 10/15>> -Chest tube for pleural effusion per CTS. -IV Lasix  per advanced heart failure team -Continue bronchodilators   Acute on chronic diastolic CHF-Limited TTE on 10/23 with LVEF of 60 to 65%.  RHC in 3/25 with mild PAH with severe RV failure but preserved CO.  Appears euvolemic on exam. -On IV Lasix  160 mg daily per advanced heart failure team. 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  AKI seems to have resolved. -Continue monitoring   Paroxysmal A-fib: s/p TEE DCCV 3/25.  Seems to be in sinus rhythm. -Amiodarone  drip per cardiology -On heparin  for anticoagulation -Optimize electrolyte   Intractable pain-reports a little pain in his left chest at chest tube site but rates his pain 11/10.  Required ketamine while in ICU.  Currently on IV Dilaudid  PCA. -Continue Dilaudid  PCA.  Essential hypertension: SBP in 150s. -Pain control as above -Diuretics as above. -IV hydralazine  as needed  Anxiety disorder: Stable. -Ativan  as needed   Acute on chronic anemia -Monitor H&H.   H/o PE/ DVT-history of IVC filter -On heparin  drip currently.  Leukocytosis: Stable. - Antibiotics as above.  GERD - IV PPI  Severe protein calorie malnutrition/hypoalbuminemia Body mass index is 20.38 kg/m. Nutrition Problem: Severe Malnutrition Etiology: acute illness (may have chronic component due to time frame of reported wt loss) Signs/Symptoms: severe fat  depletion, moderate muscle depletion Interventions: Refer to RD note for  recommendations  Pressure skin injury Wound 02/07/24 0900 Pressure Injury Foot Anterior;Right Stage 1 -  Intact skin with non-blanchable redness of a localized area usually over a bony prominence. (Active)     Wound 02/07/24 0900 Pressure Injury Foot Anterior;Left Stage 1 -  Intact skin with non-blanchable redness of a localized area usually over a bony prominence. (Active)     Wound 02/07/24 0900 Pressure Injury Buttocks Right Stage 1 -  Intact skin with non-blanchable redness of a localized area usually over a bony prominence. (Active)   DVT prophylaxis:  SCDs Start: 01/21/24 2023  Code Status: Full code Family Communication: None at bedside today. Level of care: Telemetry Cardiac Status is: Inpatient Remains inpatient appropriate because: Esophageal rupture, acute CHF,   Final disposition: LTAC?   55 minutes with more than 50% spent in reviewing records, counseling patient/family and coordinating care.  Consultants:  Gastroenterology Critical care Cardiology Cardiothoracic surgery  Procedures: See above.  Microbiology summarized: 10/5-MRSA PCR screen positive 10/15-trach aspirate culture with Candida albicans.  Objective: Vitals:   02/15/24 0645 02/15/24 0710 02/15/24 0721 02/15/24 1117  BP:   (!) 152/62 (!) 155/71  Pulse:  64 65 66  Resp: (!) 21 13 15 18   Temp:   98 F (36.7 C) (!) 97.5 F (36.4 C)  TempSrc:   Oral Oral  SpO2: 98% 98% 98% 97%  Weight:      Height:        Examination:  GENERAL: No apparent distress.  Nontoxic. HEENT: MMM.  Vision and hearing grossly intact.  NECK: Supple.  No apparent JVD.  RESP:  No IWOB.  Fair aeration bilaterally.  Chest tube to left chest. CVS:  RRR. Heart sounds normal.  ABD/GI/GU: BS+. Abd soft, NTND.  MSK/EXT:  Moves extremities. No apparent deformity. No edema.  SKIN: no apparent skin lesion or wound NEURO: AA.  Oriented appropriately.  No apparent focal neuro deficit. PSYCH: Calm. Normal affect.   Sch Meds:   Scheduled Meds:  bisacodyl   10 mg Rectal Q0600   Chlorhexidine  Gluconate Cloth  6 each Topical Daily   HYDROmorphone    Intravenous Q4H   lidocaine   1 patch Transdermal Q24H   pantoprazole  (PROTONIX ) IV  40 mg Intravenous Q12H   sodium chloride  flush  10 mL Intrapleural Q8H   sodium chloride  flush  10-40 mL Intracatheter Q12H   Continuous Infusions:  amiodarone  30 mg/hr (02/15/24 0943)   fluconazole (DIFLUCAN) IV 200 mg (02/15/24 0946)   furosemide  160 mg (02/15/24 0836)   heparin  1,950 Units/hr (02/15/24 0703)   piperacillin -tazobactam (ZOSYN )  IV 3.375 g (02/15/24 0511)   TPN ADULT (ION) 70 mL/hr at 02/14/24 1754   TPN ADULT (ION)     PRN Meds:.diphenhydrAMINE **OR** diphenhydrAMINE, [name] butt cream, hydrALAZINE , levalbuterol, LORazepam , naloxone  **AND** sodium chloride  flush, ondansetron  (ZOFRAN ) IV, mouth rinse, sodium chloride  flush  Antimicrobials: Anti-infectives (From admission, onward)    Start     Dose/Rate Route Frequency Ordered Stop   02/13/24 0900  piperacillin -tazobactam (ZOSYN ) IVPB 3.375 g        3.375 g 12.5 mL/hr over 240 Minutes Intravenous Every 8 hours 02/13/24 0812     02/07/24 0800  vancomycin (VANCOCIN) IVPB 1000 mg/200 mL premix  Status:  Discontinued        1,000 mg 200 mL/hr over 60 Minutes Intravenous Every 24 hours 02/07/24 0632 02/09/24 0905   02/06/24 2200  meropenem (MERREM) 1 g in sodium chloride  0.9 %  100 mL IVPB  Status:  Discontinued        1 g 200 mL/hr over 30 Minutes Intravenous Every 12 hours 02/06/24 1127 02/13/24 0812   02/06/24 1300  vancomycin (VANCOREADY) IVPB 1750 mg/350 mL  Status:  Discontinued        1,750 mg 175 mL/hr over 120 Minutes Intravenous Every 48 hours 02/04/24 1225 02/04/24 1226   02/06/24 0800  vancomycin (VANCOCIN) IVPB 1000 mg/200 mL premix        1,000 mg 200 mL/hr over 60 Minutes Intravenous  Once 02/06/24 0629 02/07/24 0829   02/04/24 1400  meropenem (MERREM) 1 g in sodium chloride  0.9 % 100 mL IVPB   Status:  Discontinued        1 g 200 mL/hr over 30 Minutes Intravenous Every 8 hours 02/04/24 1208 02/06/24 1127   02/04/24 1300  linezolid  (ZYVOX ) IVPB 600 mg  Status:  Discontinued        600 mg 300 mL/hr over 60 Minutes Intravenous Every 12 hours 02/04/24 1202 02/04/24 1208   02/04/24 1300  vancomycin (VANCOREADY) IVPB 2000 mg/400 mL        2,000 mg 200 mL/hr over 120 Minutes Intravenous  Once 02/04/24 1208 02/04/24 1623   02/04/24 1208  vancomycin variable dose per unstable renal function (pharmacist dosing)  Status:  Discontinued         Does not apply See admin instructions 02/04/24 1208 02/07/24 0632   01/31/24 1030  fluconazole (DIFLUCAN) IVPB 200 mg        200 mg 100 mL/hr over 60 Minutes Intravenous Daily 01/31/24 0938     01/30/24 1445  piperacillin -tazobactam (ZOSYN ) IVPB 3.375 g  Status:  Discontinued        3.375 g 12.5 mL/hr over 240 Minutes Intravenous Every 8 hours 01/30/24 1354 02/04/24 1203   01/26/24 1000  amoxicillin-clavulanate (AUGMENTIN) 400-57 MG/5ML suspension 875 mg  Status:  Discontinued        875 mg Oral Every 12 hours 01/26/24 0658 01/30/24 1354   01/26/24 1000  fluconazole (DIFLUCAN) 40 MG/ML suspension 200 mg  Status:  Discontinued        200 mg Oral Daily 01/26/24 0658 01/31/24 0938   01/22/24 1445  fluconazole (DIFLUCAN) IVPB 400 mg  Status:  Discontinued        400 mg 100 mL/hr over 120 Minutes Intravenous Every 24 hours 01/22/24 1436 01/22/24 1439   01/22/24 1445  fluconazole (DIFLUCAN) IVPB 200 mg  Status:  Discontinued        200 mg 100 mL/hr over 60 Minutes Intravenous Every 24 hours 01/22/24 1439 01/26/24 0658   01/21/24 2200  piperacillin -tazobactam (ZOSYN ) IVPB 3.375 g  Status:  Discontinued       Placed in Followed by Linked Group   3.375 g 12.5 mL/hr over 240 Minutes Intravenous Every 8 hours 01/21/24 1607 01/26/24 0658   01/21/24 1615  piperacillin -tazobactam (ZOSYN ) IVPB 3.375 g       Placed in Followed by Linked Group   3.375 g 100  mL/hr over 30 Minutes Intravenous  Once 01/21/24 1607 01/21/24 1651        I have personally reviewed the following labs and images: CBC: Recent Labs  Lab 02/11/24 1110 02/12/24 0432 02/13/24 0513 02/14/24 0548 02/15/24 0430  WBC 12.7* 11.9* 13.4* 14.4* 14.8*  HGB 9.3* 9.8* 10.9* 10.5* 10.7*  HCT 29.8* 31.3* 35.5* 33.7* 34.0*  MCV 88.2 88.4 95.2 88.2 94.4  PLT 347 338 355 321 291   BMP &  GFR Recent Labs  Lab 02/11/24 0953 02/12/24 0432 02/13/24 0637 02/14/24 0548 02/15/24 0601  NA 147* 147*  147* 144 145 145  K 4.0 3.9  3.9 3.6 4.1 3.9  CL 107 107  106 105 107 102  CO2 28 30  29 28 27 27   GLUCOSE 150* 137*  136* 157* 148* 146*  BUN 82* 83*  83* 86* 80* 74*  CREATININE 1.51* 1.67*  1.66* 1.72* 1.79* 1.86*  CALCIUM  8.7* 9.3  9.2 9.2 9.4 9.6  MG 2.2 2.2 2.2 2.2 2.0  PHOS 3.3 4.2  4.3 4.1 4.1 4.1   Estimated Creatinine Clearance: 36.1 mL/min (A) (by C-G formula based on SCr of 1.86 mg/dL (H)). Liver & Pancreas: Recent Labs  Lab 02/11/24 0953 02/12/24 0432 02/13/24 0637 02/14/24 0548 02/15/24 0601  AST  --  17  --   --  15  ALT  --  20  --   --  19  ALKPHOS  --  95  --   --  95  BILITOT  --  0.5  --   --  0.5  PROT  --  6.4*  --   --  7.1  ALBUMIN  <1.5* 1.5*  1.5* 1.7* 1.7* 1.8*   No results for input(s): LIPASE, AMYLASE in the last 168 hours. No results for input(s): AMMONIA in the last 168 hours. Diabetic: No results for input(s): HGBA1C in the last 72 hours. Recent Labs  Lab 02/11/24 2312 02/12/24 0442 02/12/24 0725 02/12/24 1120 02/12/24 1601  GLUCAP 145* 130* 131* 130* 137*   Cardiac Enzymes: No results for input(s): CKTOTAL, CKMB, CKMBINDEX, TROPONINI in the last 168 hours. No results for input(s): PROBNP in the last 8760 hours. Coagulation Profile: No results for input(s): INR, PROTIME in the last 168 hours. Thyroid  Function Tests: No results for input(s): TSH, T4TOTAL, FREET4, T3FREE, THYROIDAB in  the last 72 hours. Lipid Profile: No results for input(s): CHOL, HDL, LDLCALC, TRIG, CHOLHDL, LDLDIRECT in the last 72 hours.  Anemia Panel: No results for input(s): VITAMINB12, FOLATE, FERRITIN, TIBC, IRON , RETICCTPCT in the last 72 hours. Urine analysis:    Component Value Date/Time   COLORURINE YELLOW 07/04/2023 1141   APPEARANCEUR HAZY (A) 07/04/2023 1141   LABSPEC 1.010 07/04/2023 1141   PHURINE 5.0 07/04/2023 1141   GLUCOSEU NEGATIVE 07/04/2023 1141   HGBUR NEGATIVE 07/04/2023 1141   BILIRUBINUR NEGATIVE 07/04/2023 1141   KETONESUR NEGATIVE 07/04/2023 1141   PROTEINUR NEGATIVE 07/04/2023 1141   UROBILINOGEN 0.2 08/31/2019 1136   NITRITE NEGATIVE 07/04/2023 1141   LEUKOCYTESUR NEGATIVE 07/04/2023 1141   Sepsis Labs: Invalid input(s): PROCALCITONIN, LACTICIDVEN  Microbiology: No results found for this or any previous visit (from the past 240 hours).   Radiology Studies: No results found.    Lynnley Doddridge T. Evan Mackie [facility]  If 7PM-7AM, please contact night-coverage [email or web address] 02/15/2024, 11:50 AM

## 2024-02-15 NOTE — Progress Notes (Signed)
 PHARMACY - ANTICOAGULATION CONSULT NOTE  Pharmacy Consult for heparin  infusion Indication: atrial fibrillation  Allergies  Allergen Reactions   Norvasc  [Amlodipine ] Swelling and Other (See Comments)    Excessive BLE swelling Skin discoloration, blotching   Jardiance  [Empagliflozin ] Itching and Other (See Comments)    Patient mentioned penile swelling, pain   Morphine And Codeine Itching    Opioid-induced pruritus    Patient Measurements: Height: 6' 1 (185.4 cm) Weight: 70.1 kg (154 lb 8 oz) IBW/kg (Calculated) : 79.9 HEPARIN  DW (KG): 80.6  Vital Signs: Temp: 98 F (36.7 C) (10/30 0721) Temp Source: Oral (10/30 0721) BP: 152/62 (10/30 0721) Pulse Rate: 65 (10/30 0721)  Labs: Recent Labs    02/13/24 0512 02/13/24 0513 02/13/24 0513 02/13/24 9362 02/14/24 0548 02/14/24 0549 02/15/24 0430 02/15/24 0601  HGB  --  10.9*   < >  --  10.5*  --  10.7*  --   HCT  --  35.5*  --   --  33.7*  --  34.0*  --   PLT  --  355  --   --  321  --  291  --   HEPARINUNFRC 0.37  --   --   --   --  0.42 0.33  --   CREATININE  --   --   --  1.72* 1.79*  --   --  1.86*   < > = values in this interval not displayed.    Estimated Creatinine Clearance: 36.1 mL/min (A) (by C-G formula based on SCr of 1.86 mg/dL (H)).   Assessment: 73 yo M presenting for esophageal rupture, now s/p esophageal stent placement on 10/6. PMH significant for HFrEF, Afib on Eliquis  PTA. Last dose of Eliquis  01/20/24, time unknown. Pharmacy consulted for heparin  management.  10/15 - underwent EGD with stent reposition on 10/15 after finding esophageal leak.   Heparin  level this morning came back therapeutic at 0.33, on 1950 units/hr. Hgb 10.7, plt 291. No s/sx of bleeding or infusion issues.   Pt remains NPO.  Goal of Therapy:  Heparin  level 0.3-0.5 units/ml Monitor platelets by anticoagulation protocol: Yes   Plan:  Continue IV heparin  to 1950 units/hr Monitor heparin  level daily Monitor CBC and s/sx of  bleeding daily  Thank you for allowing pharmacy to participate in this patient's care,  Suzen Sour, PharmD, BCCCP Clinical Pharmacist  Phone: [phone] 02/15/2024 9:33 AM  Please check AMION for all [facility] Pharmacy phone numbers After 10:00 PM, call Main Pharmacy [phone]

## 2024-02-15 NOTE — TOC Progression Note (Addendum)
 Transition of Care [facility]) - Progression Note    Patient Details  Name: Billy [name] MRN: [id number] Date of Birth: 02/16/1951  Transition of Care [facility]) CM/SW Contact  Justina Delcia Czar, RN Phone Number: [phone] 02/15/2024, 12:17 PM  Clinical Narrative:    PT/OT recommendations for SNF rehab.  Spoke to pt and states if he needs SNF rehab, he will be willing because he lives alone. Gave permission to speak to son, sister, dtr or ex-wife. Gave permission to create FL2 and fax referral.   Chart reviewed for discharge readiness, patient not medically stable for d/c. Inpatient CM/CSW will continue to monitor pt's advancement through interdisciplinary progression rounds.   If new pt transition needs arise, MD please place a TOC consult.    Expected Discharge Plan: Skilled Nursing Facility Barriers to Discharge: Continued Medical Work up    Expected Discharge Plan and Services In-house Referral: Clinical Social Work Discharge Planning Services: CM Consult Post Acute Care Choice: Skilled Nursing Facility Living arrangements for the past 2 months: Single Family Home                                       Social Drivers of Health (SDOH) Interventions SDOH Screenings   Food Insecurity: No Food Insecurity (01/22/2024)  Housing: High Risk (01/22/2024)  Transportation Needs: No Transportation Needs (01/22/2024)  Utilities: Not At Risk (01/22/2024)  Depression (PHQ2-9): Low Risk  (12/01/2020)  Social Connections: Socially Isolated (01/22/2024)  Tobacco Use: Low Risk  (01/31/2024)    Readmission Risk Interventions    06/27/2023    1:52 PM 10/14/2022   11:29 AM  Readmission Risk Prevention Plan  Post Dischage Appt  Complete  Medication Screening  Complete  Transportation Screening Complete Complete  HRI or Home Care Consult Complete   Social Work Consult for Recovery Care Planning/Counseling Complete   Palliative Care Screening Not Applicable   Medication Review Furniture Conservator/restorer) Referral to Pharmacy

## 2024-02-15 NOTE — Progress Notes (Signed)
 Occupational Therapy Treatment Patient Details Name: Billy [name] MRN: [id number] DOB: 03/16/1951 Today's Date: 02/15/2024   History of present illness Billy [name] is a 73 y.o. male who presented to [facility] ED 01/21/24 with several days of vomiting followed by an episode of severe tearing epigastric pain. Work-up revealed esophageal tear. Pt s/p esophageal stent with EGD 10/7. Return to OR for esophageal stent adjustment on 10/15, remained intubated after procedure due to suspected aspiration. Extubated 10/16.  Pt had CT placed 10/16. Reintubated 10/20-10/24. PMHx: HFrEF, AFib, CAD, PE complicated by cardiogenic shock, CKD 4, MI, peripheral neuropathy, and TIA.   OT comments  Pt progressing well towards goals. Progressed to tolerate ~5 minutes of standing grooming task at sink, before fatigues. Pt reporting fatigue limiting, unable to attempt second standing trial at sink for oral care. Pt easily perseverates on tasks and requires increased time and cues to cease.Updated d/c recs to <3 hours of skilled rehab daily to optimize independence levels. Will continue to follow acutely.       If plan is discharge home, recommend the following:  Two people to help with walking and/or transfers;Two people to help with bathing/dressing/bathroom;Assistance with cooking/housework;Assistance with feeding;Direct supervision/assist for medications management;Direct supervision/assist for financial management;Assist for transportation;Help with stairs or ramp for entrance   Equipment Recommendations  Other (comment) (Defer)       Precautions / Restrictions Precautions Precautions: Fall Recall of Precautions/Restrictions: Impaired Precaution/Restrictions Comments: BIL chest tubes in place; Restrictions Weight Bearing Restrictions Per Provider Order: No       Mobility Bed Mobility     General bed mobility comments: OOB in chair upon arrival and departure    Transfers Overall transfer level: Needs  assistance   Transfers: Sit to/from Stand Sit to Stand: Min assist, +2 safety/equipment, +2 physical assistance           General transfer comment: min assist to come to stand, +2 for safety with lines. CGA to min assist to sustain standing with hands supported on sink     Balance Overall balance assessment: Needs assistance Sitting-balance support: Feet supported, Bilateral upper extremity supported Sitting balance-Leahy Scale: Fair     Standing balance support: During functional activity, Bilateral upper extremity supported, Reliant on assistive device for balance Standing balance-Leahy Scale: Poor Standing balance comment: reliant on external support     ADL either performed or assessed with clinical judgement   ADL Overall ADL's : Needs assistance/impaired     Grooming: Contact guard assist;Minimal assistance;Oral care;Wash/dry face;Standing Grooming Details (indicate cue type and reason): Tolerated standing at sink to wash face ~5 minutes, needed to complete oral care seated d/t fatigue       Toileting- Clothing Manipulation and Hygiene: Minimal assistance;Contact guard assist;Sit to/from stand Toileting - Clothing Manipulation Details (indicate cue type and reason): CGA to min assist to balance in standing     Functional mobility during ADLs: Contact guard assist;Minimal assistance General ADL Comments: Limited d/t decreased activity tolerance    Extremity/Trunk Assessment Upper Extremity Assessment Upper Extremity Assessment: Generalized weakness   Lower Extremity Assessment Lower Extremity Assessment: Defer to PT evaluation        Vision   Vision Assessment?: No apparent visual deficits         Communication Communication Communication: Impaired Factors Affecting Communication: Difficulty expressing self   Cognition Arousal: Alert Behavior During Therapy: Flat affect Cognition: Cognition impaired     Awareness: Online awareness impaired    Attention impairment (select first level of impairment): Sustained attention Executive  functioning impairment (select all impairments): Organization, Reasoning, Problem solving OT - Cognition Comments: delayed processing, pt easily perseverates on tasks       Following commands: Impaired        Cueing   Cueing Techniques: Verbal cues, Gestural cues, Tactile cues        General Comments VSS on 2L, pt removed PCA, declined to put back on, despite education. Notified and left pt with RN    Pertinent Vitals/ Pain       Pain Assessment Pain Assessment: Faces Faces Pain Scale: Hurts a little bit Pain Location: did not specify Pain Descriptors / Indicators: Discomfort Pain Intervention(s): Monitored during session         Frequency  Min 2X/week        Progress Toward Goals  OT Goals(current goals can now be found in the care plan section)  Progress towards OT goals: Progressing toward goals  Acute Rehab OT Goals OT Goal Formulation: With patient Time For Goal Achievement: 02/21/24 Potential to Achieve Goals: Fair ADL Goals Pt Will Perform Grooming: with supervision;sitting Pt Will Perform Lower Body Bathing: with supervision;sit to/from stand Pt Will Perform Upper Body Dressing: with min assist;sitting Pt Will Perform Lower Body Dressing: with supervision;sit to/from stand Pt Will Transfer to Toilet: with mod assist;stand pivot transfer Pt Will Perform Toileting - Clothing Manipulation and hygiene: with mod assist;sitting/lateral leans Additional ADL Goal #1: Pt will complete bed mobility with mod assist in preparation for ADLs.  Plan         AM-PAC OT 6 Clicks Daily Activity     Outcome Measure   Help from another person eating meals?: Total Help from another person taking care of personal grooming?: A Little Help from another person toileting, which includes using toliet, bedpan, or urinal?: A Little Help from another person bathing (including washing,  rinsing, drying)?: A Lot Help from another person to put on and taking off regular upper body clothing?: A Lot Help from another person to put on and taking off regular lower body clothing?: A Lot 6 Click Score: 13    End of Session Equipment Utilized During Treatment: Oxygen   OT Visit Diagnosis: Muscle weakness (generalized) (M62.81)   Activity Tolerance Patient limited by fatigue   Patient Left in chair;with call bell/phone within reach   Nurse Communication Mobility status        Time: 8695-8653 OT Time Calculation (min): 42 min  Charges: OT General Charges $OT Visit: 1 Visit OT Treatments $Self Care/Home Management : 38-52 mins  Adrianne BROCKS, OT  Acute Rehabilitation Services Office [phone] Secure chat preferred   Adrianne GORMAN [employer]vers 02/15/2024, 3:53 PM

## 2024-02-15 NOTE — Progress Notes (Signed)
 PHARMACY - TOTAL PARENTERAL NUTRITION CONSULT NOTE   Indication: intolerance to enteral feedings, esophageal perforation  Patient Measurements: Height: 6' 1 (185.4 cm) Weight: 70.1 kg (154 lb 8 oz) IBW/kg (Calculated) : 79.9 TPN AdjBW (KG): 80.6 Body mass index is 20.38 kg/m.  Assessment:  73 year old man with medical history significant for cardiac arrest earlier this year who was admitted after several days of nausea and vomiting followed by sudden onset severe tearing epigastric pain radiating to back and chest. 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 Pharmacy consulted to initiate TPN due to inability to tolerate enteral nutrition at this time.  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 PTA takes torsemide  20 mg PO daily, now held. Aggressive fluid resuscitation recommended per GI. Per HF, patient currently euvolemic but will hold diuretic for now. Due to CHF will target lower end of fluid range for TPN and supplement additional fluids per MD outside of TPN to prevent fluid overload.   Stent placed on 10/6, esophogram not showing any leak. Diet advanced to CLD. 10/15 Leak present and made NPO. 10/24 esophagram with persistent leak; will need stent replacement in 3-4 weeks.   10/28 AM update:  Patient has weights that are significantly more than admission weight of 72.7 kg on 10/5. This may represent false weights from bed scale.  Current weight is 71.6 kg  Glucose / Insulin : A1C 6.0% (02/2023);  BG <140, 13 units insulin  in TPN  Electrolytes: Na 145, Cl 102, K 3.9, CO2 27 Ca 9.6 [CoCa 11.36], Phos 4.1, Mg 2 Renal: Scr 1.86 (BL ~2), BUN 74 Hepatic: AST/ALT/tbili/alk phos wnl, alb 1.8, TG 41 Intake / Output; MIVF: furosemide  160 mg IV daily starting 10/28; UOP 1.63 mL/kg/hr, chest tube 10 ml, LBM 10/28 Net IO Since Admission: 4,891.51 mL [02/15/24 0748]  GI Imaging: 10/05 CT: concerning for esophageal perforation, masslike area of  soft tissue thickening adjacent to and surrounding the esophagus  10/06 Esoph XR: distal esophageal perforation 10/7 Esoph XR: No leak associated with stent coverage of esophageal perforation 10/14 KUB: Gas-filled loops of small bowel and colon suggestive of ileus 10/15 Esoph XR: esophageal perforation/leak at the distal esophagus, proximal to the esophageal stent 10/24 esophagram: persistent leak despite being covered by the esophageal stent.  GI Surgeries / Procedures:  10/6: esophageal stent placement  10/15 EGD and stent repositioning   Central access: 01/22/24 TPN start date: 01/22/24  Nutritional Goals:  Goal concentrated TPN is 70 ml/hr (provides 116 g of protein and 2301 kcals per day  RD Assessment:  Estimated Needs Total Energy Estimated Needs: 2300-2500 kcals Total Protein Estimated Needs: 115-130 g Total Fluid Estimated Needs: 2L  Current Nutrition:  TPN  10/13- 10/14 DYS1: not taking any po  10/15: NPO d/t increased abd distension  10/22 Propofol  at 7.6- 20.2 ml/hr, providing ~370 kcals/24hr- OFF 10/23: switch propofol  back to dexmedetomidine - OFF   Plan:  Continue concentrated TPN at goal 70 mL/hr, providing 100% estimated nutritional needs   Electrolytes in TPN: Na 0 mEq/L, K 50 mEq/L, Ca 0 mEq/L, Mg 0 mEq/L, Phos 10 mmol/L. Cl:Ac max acetate (cannot do 1:1 or 1:2 due to no Na in bag ) Continue standard MVI and trace elements to TPN Continue 13 units insulin  regular to TPN Monitor TPN labs on Mon/Thurs, and PRN    Thank you for involving pharmacy in this patient's care.   Benedetta Heath BS, PharmD, BCPS Clinical Pharmacist 02/15/2024 7:35 AM  Contact: [phone] after  3 PM

## 2024-02-16 DIAGNOSIS — E43 Unspecified severe protein-calorie malnutrition: Secondary | ICD-10-CM | POA: Diagnosis not present

## 2024-02-16 DIAGNOSIS — I5023 Acute on chronic systolic (congestive) heart failure: Secondary | ICD-10-CM | POA: Diagnosis not present

## 2024-02-16 DIAGNOSIS — J90 Pleural effusion, not elsewhere classified: Secondary | ICD-10-CM | POA: Diagnosis not present

## 2024-02-16 DIAGNOSIS — K223 Perforation of esophagus: Secondary | ICD-10-CM | POA: Diagnosis not present

## 2024-02-16 LAB — MAGNESIUM: Magnesium: 2 mg/dL (ref 1.7–2.4)

## 2024-02-16 LAB — CBC
HCT: 34.7 % — ABNORMAL LOW (ref 39.0–52.0)
Hemoglobin: 10.8 g/dL — ABNORMAL LOW (ref 13.0–17.0)
MCH: 27.5 pg (ref 26.0–34.0)
MCHC: 31.1 g/dL (ref 30.0–36.0)
MCV: 88.3 fL (ref 80.0–100.0)
Platelets: 289 K/uL (ref 150–400)
RBC: 3.93 MIL/uL — ABNORMAL LOW (ref 4.22–5.81)
RDW: 18.6 % — ABNORMAL HIGH (ref 11.5–15.5)
WBC: 12.3 K/uL — ABNORMAL HIGH (ref 4.0–10.5)
nRBC: 0 % (ref 0.0–0.2)

## 2024-02-16 LAB — RENAL FUNCTION PANEL
Albumin: 1.8 g/dL — ABNORMAL LOW (ref 3.5–5.0)
Anion gap: 12 (ref 5–15)
BUN: 73 mg/dL — ABNORMAL HIGH (ref 8–23)
CO2: 27 mmol/L (ref 22–32)
Calcium: 9.4 mg/dL (ref 8.9–10.3)
Chloride: 104 mmol/L (ref 98–111)
Creatinine, Ser: 2 mg/dL — ABNORMAL HIGH (ref 0.61–1.24)
GFR, Estimated: 35 mL/min — ABNORMAL LOW (ref >60)
Glucose, Bld: 109 mg/dL — ABNORMAL HIGH (ref 70–99)
Phosphorus: 4.3 mg/dL (ref 2.5–4.6)
Potassium: 3.7 mmol/L (ref 3.5–5.1)
Sodium: 143 mmol/L (ref 135–145)

## 2024-02-16 LAB — HEPARIN LEVEL (UNFRACTIONATED): Heparin Unfractionated: 0.48 [IU]/mL (ref 0.30–0.70)

## 2024-02-16 MED ORDER — POTASSIUM CHLORIDE 10 MEQ/50ML IV SOLN
10.0000 meq | INTRAVENOUS | Status: AC
  Administered 2024-02-16 (×3): 10 meq via INTRAVENOUS
  Filled 2024-02-16 (×2): qty 50

## 2024-02-16 MED ORDER — TRACE MINERALS CU-MN-SE-ZN 300-55-60-3000 MCG/ML IV SOLN
INTRAVENOUS | Status: AC
  Filled 2024-02-16: qty 772.8

## 2024-02-16 MED ORDER — GERHARDT'S BUTT CREAM
TOPICAL_CREAM | Freq: Two times a day (BID) | CUTANEOUS | Status: DC
  Administered 2024-02-18 – 2024-04-18 (×7): 1 via TOPICAL
  Filled 2024-02-16 (×12): qty 60

## 2024-02-16 MED ORDER — FUROSEMIDE 10 MG/ML IJ SOLN
120.0000 mg | Freq: Every day | INTRAVENOUS | Status: DC
  Administered 2024-02-17 – 2024-02-20 (×4): 120 mg via INTRAVENOUS
  Filled 2024-02-16 (×4): qty 10

## 2024-02-16 NOTE — Progress Notes (Signed)
 PHARMACY - ANTICOAGULATION CONSULT NOTE  Pharmacy Consult for heparin  infusion Indication: atrial fibrillation  Allergies  Allergen Reactions   Norvasc  [Amlodipine ] Swelling and Other (See Comments)    Excessive BLE swelling Skin discoloration, blotching   Jardiance  [Empagliflozin ] Itching and Other (See Comments)    Patient mentioned penile swelling, pain   Morphine And Codeine Itching    Opioid-induced pruritus    Patient Measurements: Height: 6' 1 (185.4 cm) Weight: 70.1 kg (154 lb 8 oz) IBW/kg (Calculated) : 79.9 HEPARIN  DW (KG): 80.6  Vital Signs: Temp: 97.6 F (36.4 C) (10/31 0332) Temp Source: Oral (10/31 0332) BP: 142/61 (10/31 0332) Pulse Rate: 63 (10/31 0332)  Labs: Recent Labs    02/14/24 0548 02/14/24 0549 02/15/24 0430 02/15/24 0601 02/16/24 0440  HGB 10.5*  --  10.7*  --  10.8*  HCT 33.7*  --  34.0*  --  34.7*  PLT 321  --  291  --  289  HEPARINUNFRC  --  0.42 0.33  --  0.48  CREATININE 1.79*  --   --  1.86* 2.00*    Estimated Creatinine Clearance: 33.6 mL/min (A) (by C-G formula based on SCr of 2 mg/dL (H)).   Assessment: 73 yo M presenting for esophageal rupture, now s/p esophageal stent placement on 10/6. PMH significant for HFrEF, Afib on Eliquis  PTA. Last dose of Eliquis  01/20/24, time unknown. Pharmacy consulted for heparin  management.  10/15 - underwent EGD with stent reposition on 10/15 after finding esophageal leak.   Heparin  level this morning came back therapeutic at 0.48, on 1950 units/hr. Hgb 10.8, plt 289. No s/sx of bleeding or infusion issues.   Pt remains NPO.  Goal of Therapy:  Heparin  level 0.3-0.5 units/ml Monitor platelets by anticoagulation protocol: Yes   Plan:  Continue IV heparin  to 1950 units/hr Monitor heparin  level daily Monitor CBC and s/sx of bleeding daily  Thank you for allowing pharmacy to participate in this patient's care,  Suzen Sour, PharmD, BCCCP Clinical Pharmacist  Phone:  [phone] 02/16/2024 7:12 AM  Please check AMION for all [facility] Pharmacy phone numbers After 10:00 PM, call Main Pharmacy [phone]

## 2024-02-16 NOTE — Plan of Care (Signed)

## 2024-02-16 NOTE — Progress Notes (Addendum)
 PROGRESS NOTE  Billy [name] FMW:[id number] DOB: May 24, 1950   PCP: Seabron Lenis, MD  Patient is from: Home.  DOA: 01/21/2024 LOS: 26  Chief complaints Chief Complaint  Patient presents with   Abdominal Pain   Emesis     Brief Narrative / Interim history: 73 year old M with PMH of cardiac arrest earlier this year along with HFrEF, Afib, PE presented with several days of nausea and vomiting from a stomach bug followed by sudden onset severe tearing epigastric pain radiating to back and chest. Workup revealed esophageal tear/Boerhaave.  Patient had esophageal stent placed on 10/7.  Subsequent swallow study revealed an additional leak, and he had stent repositioned on 01/31/2024.  He became hypoxic with gastric contents noted on intubation, and he was transferred to ICU for mechanical ventilation.  Bedside ultrasound with right pleural effusion for which he had chest tube placed with removal of 300 cc exudative fluid.  He was also successfully extubated on 10/16.  Patient has had increased oxygen  requirement requiring reintubation on 10/20.  Bedside ultrasound showed left-sided pleural effusion and he had left-sided chest tube with 120 cc exudative output. ICU stay complicated by high O2 requirement, left sided pleural effusion, chest tube placement 10/16, reintubated 10/20.  Eventually, patient was extubated and transferred out of ICU on 10/28.  Hospital course complicated by acute on chronic HFpEF for which she is on IV Lasix  per advanced heart failure team.  Subjective: Seen and examined earlier this morning.  No major events overnight or this morning.  Sitting on bedside chair.  No complaints.    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 on TPN for nutrition.  Some discussion about PEG tube -Cardiothoracic  following.  -Left chest tube drainage. -Now on IV Zosyn  per cardiothoracic surgery. -Stent replacement/exchange every 3-4 weeks.    Acute hypoxic/ hypercapnic respiratory failure-multifactorial including aspiration pneumonia, pleural effusion and CHF.  Trach aspirate on 10/15 with Candida albicans. -IV meropenem>> IV Zosyn  for aspiration pneumonia. -On Diflucan for Candida albicans from trach aspirate 10/15>> -Chest tube for pleural effusion per CTS. -IV Lasix  per advanced heart failure team -Continue bronchodilators   Acute on chronic diastolic CHF-Limited TTE on 10/23 with LVEF of 60 to 65%.  RHC in 3/25 with mild PAH with severe RV failure but preserved CO.  Appears euvolemic on exam. -On IV Lasix  160 mg daily per advanced heart failure team. 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  AKI seems to have resolved. -Continue monitoring   Paroxysmal A-fib: s/p TEE DCCV 3/25.  Seems to be in sinus rhythm. -Amiodarone  drip per cardiology -On heparin  for anticoagulation -Optimize electrolyte   Intractable pain-reports a little pain in his left chest at chest tube site but rates his pain 11/10.  Required ketamine while in ICU.  Currently on IV Dilaudid  PCA. -Continue Dilaudid  PCA.  H/o PE/ DVT- s/p infrarenal IVC placement 3/25. V/Q 3/25 with multiple perfusion defects - Continue IV heparin .  Essential hypertension: SBP in 150s. -Pain control as above -Diuretics as above. -IV hydralazine  as needed  Anxiety disorder: Stable. -Ativan  as needed   Acute on chronic anemia -Monitor H&H.   Leukocytosis: Improved. - Antibiotics as above.  GERD - IV PPI  Severe protein calorie malnutrition/hypoalbuminemia Body mass index is 20.38 kg/m. Nutrition Problem: Severe Malnutrition Etiology: acute illness (may have chronic component due  to time frame of reported wt loss) Signs/Symptoms: severe fat depletion, moderate muscle  depletion Interventions: Refer to RD note for recommendations  Pressure skin injury Wound 02/07/24 0900 Pressure Injury Foot Anterior;Right Stage 1 -  Intact skin with non-blanchable redness of a localized area usually over a bony prominence. (Active)     Wound 02/07/24 0900 Pressure Injury Foot Anterior;Left Stage 1 -  Intact skin with non-blanchable redness of a localized area usually over a bony prominence. (Active)     Wound 02/07/24 0900 Pressure Injury Buttocks Right Stage 1 -  Intact skin with non-blanchable redness of a localized area usually over a bony prominence. (Active)   DVT prophylaxis:  SCDs Start: 01/21/24 2023  Code Status: Full code Family Communication: None at bedside today. Level of care: Telemetry Cardiac Status is: Inpatient Remains inpatient appropriate because: Esophageal rupture, acute CHF,   Final disposition: LTAC?   55 minutes with more than 50% spent in reviewing records, counseling patient/family and coordinating care.  Consultants:  Gastroenterology Critical care Cardiology Cardiothoracic surgery  Procedures: See above.  Microbiology summarized: 10/5-MRSA PCR screen positive 10/15-trach aspirate culture with Candida albicans.  Objective: Vitals:   02/16/24 0812 02/16/24 0819 02/16/24 1202 02/16/24 1229  BP: 125/71  137/64   Pulse: 64  64   Resp: 17 12 19 19   Temp: 97.9 F (36.6 C)  98.1 F (36.7 C)   TempSrc: Oral  Oral   SpO2: 99%  98%   Weight:      Height:        Examination:  GENERAL: No apparent distress.  Nontoxic. HEENT: MMM.  Vision and hearing grossly intact.  NECK: Supple.  No apparent JVD.  RESP:  No IWOB.  Fair aeration bilaterally.  Chest tube to left chest. CVS:  RRR. Heart sounds normal.  ABD/GI/GU: BS+. Abd soft, NTND.  MSK/EXT:  Moves extremities. No apparent deformity. No edema.  SKIN: no apparent skin lesion or wound NEURO: AA.  Oriented appropriately.  No apparent focal neuro deficit. PSYCH: Calm.  Normal affect.   Sch Meds:  Scheduled Meds:  bisacodyl   10 mg Rectal Q0600   Chlorhexidine  Gluconate Cloth  6 each Topical Daily   HYDROmorphone    Intravenous Q4H   lidocaine   1 patch Transdermal Q24H   pantoprazole  (PROTONIX ) IV  40 mg Intravenous Q12H   sodium chloride  flush  10 mL Intrapleural Q8H   sodium chloride  flush  10-40 mL Intracatheter Q12H   Continuous Infusions:  amiodarone  30 mg/hr (02/16/24 1016)   fluconazole (DIFLUCAN) IV 200 mg (02/16/24 1021)   [START ON 02/17/2024] furosemide      heparin  1,950 Units/hr (02/16/24 0559)   piperacillin -tazobactam (ZOSYN )  IV 12.5 mL/hr at 02/16/24 0559   potassium chloride  10 mEq (02/16/24 1147)   TPN ADULT (ION) 70 mL/hr at 02/16/24 0559   TPN ADULT (ION)     PRN Meds:.diphenhydrAMINE **OR** diphenhydrAMINE, [name] butt cream, hydrALAZINE , levalbuterol, LORazepam , naloxone  **AND** sodium chloride  flush, ondansetron  (ZOFRAN ) IV, mouth rinse, sodium chloride  flush  Antimicrobials: Anti-infectives (From admission, onward)    Start     Dose/Rate Route Frequency Ordered Stop   02/13/24 0900  piperacillin -tazobactam (ZOSYN ) IVPB 3.375 g        3.375 g 12.5 mL/hr over 240 Minutes Intravenous Every 8 hours 02/13/24 0812     02/07/24 0800  vancomycin (VANCOCIN) IVPB 1000 mg/200 mL premix  Status:  Discontinued        1,000 mg 200 mL/hr over 60 Minutes Intravenous Every 24 hours 02/07/24  9367 02/09/24 0905   02/06/24 2200  meropenem (MERREM) 1 g in sodium chloride  0.9 % 100 mL IVPB  Status:  Discontinued        1 g 200 mL/hr over 30 Minutes Intravenous Every 12 hours 02/06/24 1127 02/13/24 0812   02/06/24 1300  vancomycin (VANCOREADY) IVPB 1750 mg/350 mL  Status:  Discontinued        1,750 mg 175 mL/hr over 120 Minutes Intravenous Every 48 hours 02/04/24 1225 02/04/24 1226   02/06/24 0800  vancomycin (VANCOCIN) IVPB 1000 mg/200 mL premix        1,000 mg 200 mL/hr over 60 Minutes Intravenous  Once 02/06/24 0629 02/07/24 0829    02/04/24 1400  meropenem (MERREM) 1 g in sodium chloride  0.9 % 100 mL IVPB  Status:  Discontinued        1 g 200 mL/hr over 30 Minutes Intravenous Every 8 hours 02/04/24 1208 02/06/24 1127   02/04/24 1300  linezolid  (ZYVOX ) IVPB 600 mg  Status:  Discontinued        600 mg 300 mL/hr over 60 Minutes Intravenous Every 12 hours 02/04/24 1202 02/04/24 1208   02/04/24 1300  vancomycin (VANCOREADY) IVPB 2000 mg/400 mL        2,000 mg 200 mL/hr over 120 Minutes Intravenous  Once 02/04/24 1208 02/04/24 1623   02/04/24 1208  vancomycin variable dose per unstable renal function (pharmacist dosing)  Status:  Discontinued         Does not apply See admin instructions 02/04/24 1208 02/07/24 0632   01/31/24 1030  fluconazole (DIFLUCAN) IVPB 200 mg        200 mg 100 mL/hr over 60 Minutes Intravenous Daily 01/31/24 0938     01/30/24 1445  piperacillin -tazobactam (ZOSYN ) IVPB 3.375 g  Status:  Discontinued        3.375 g 12.5 mL/hr over 240 Minutes Intravenous Every 8 hours 01/30/24 1354 02/04/24 1203   01/26/24 1000  amoxicillin-clavulanate (AUGMENTIN) 400-57 MG/5ML suspension 875 mg  Status:  Discontinued        875 mg Oral Every 12 hours 01/26/24 0658 01/30/24 1354   01/26/24 1000  fluconazole (DIFLUCAN) 40 MG/ML suspension 200 mg  Status:  Discontinued        200 mg Oral Daily 01/26/24 0658 01/31/24 0938   01/22/24 1445  fluconazole (DIFLUCAN) IVPB 400 mg  Status:  Discontinued        400 mg 100 mL/hr over 120 Minutes Intravenous Every 24 hours 01/22/24 1436 01/22/24 1439   01/22/24 1445  fluconazole (DIFLUCAN) IVPB 200 mg  Status:  Discontinued        200 mg 100 mL/hr over 60 Minutes Intravenous Every 24 hours 01/22/24 1439 01/26/24 0658   01/21/24 2200  piperacillin -tazobactam (ZOSYN ) IVPB 3.375 g  Status:  Discontinued       Placed in Followed by Linked Group   3.375 g 12.5 mL/hr over 240 Minutes Intravenous Every 8 hours 01/21/24 1607 01/26/24 0658   01/21/24 1615  piperacillin -tazobactam  (ZOSYN ) IVPB 3.375 g       Placed in Followed by Linked Group   3.375 g 100 mL/hr over 30 Minutes Intravenous  Once 01/21/24 1607 01/21/24 1651        I have personally reviewed the following labs and images: CBC: Recent Labs  Lab 02/12/24 0432 02/13/24 0513 02/14/24 0548 02/15/24 0430 02/16/24 0440  WBC 11.9* 13.4* 14.4* 14.8* 12.3*  HGB 9.8* 10.9* 10.5* 10.7* 10.8*  HCT 31.3* 35.5* 33.7* 34.0* 34.7*  MCV 88.4 95.2 88.2 94.4 88.3  PLT 338 355 321 291 289   BMP &GFR Recent Labs  Lab 02/12/24 0432 02/13/24 0637 02/14/24 0548 02/15/24 0601 02/16/24 0440  NA 147*  147* 144 145 145 143  K 3.9  3.9 3.6 4.1 3.9 3.7  CL 107  106 105 107 102 104  CO2 30  29 28 27 27 27   GLUCOSE 137*  136* 157* 148* 146* 109*  BUN 83*  83* 86* 80* 74* 73*  CREATININE 1.67*  1.66* 1.72* 1.79* 1.86* 2.00*  CALCIUM  9.3  9.2 9.2 9.4 9.6 9.4  MG 2.2 2.2 2.2 2.0 2.0  PHOS 4.2  4.3 4.1 4.1 4.1 4.3   Estimated Creatinine Clearance: 33.6 mL/min (A) (by C-G formula based on SCr of 2 mg/dL (H)). Liver & Pancreas: Recent Labs  Lab 02/12/24 0432 02/13/24 9362 02/14/24 0548 02/15/24 0601 02/16/24 0440  AST 17  --   --  15  --   ALT 20  --   --  19  --   ALKPHOS 95  --   --  95  --   BILITOT 0.5  --   --  0.5  --   PROT 6.4*  --   --  7.1  --   ALBUMIN  1.5*  1.5* 1.7* 1.7* 1.8* 1.8*   No results for input(s): LIPASE, AMYLASE in the last 168 hours. No results for input(s): AMMONIA in the last 168 hours. Diabetic: No results for input(s): HGBA1C in the last 72 hours. Recent Labs  Lab 02/11/24 2312 02/12/24 0442 02/12/24 0725 02/12/24 1120 02/12/24 1601  GLUCAP 145* 130* 131* 130* 137*   Cardiac Enzymes: No results for input(s): CKTOTAL, CKMB, CKMBINDEX, TROPONINI in the last 168 hours. No results for input(s): PROBNP in the last 8760 hours. Coagulation Profile: No results for input(s): INR, PROTIME in the last 168 hours. Thyroid  Function Tests: No  results for input(s): TSH, T4TOTAL, FREET4, T3FREE, THYROIDAB in the last 72 hours. Lipid Profile: No results for input(s): CHOL, HDL, LDLCALC, TRIG, CHOLHDL, LDLDIRECT in the last 72 hours.  Anemia Panel: No results for input(s): VITAMINB12, FOLATE, FERRITIN, TIBC, IRON , RETICCTPCT in the last 72 hours. Urine analysis:    Component Value Date/Time   COLORURINE YELLOW 07/04/2023 1141   APPEARANCEUR HAZY (A) 07/04/2023 1141   LABSPEC 1.010 07/04/2023 1141   PHURINE 5.0 07/04/2023 1141   GLUCOSEU NEGATIVE 07/04/2023 1141   HGBUR NEGATIVE 07/04/2023 1141   BILIRUBINUR NEGATIVE 07/04/2023 1141   KETONESUR NEGATIVE 07/04/2023 1141   PROTEINUR NEGATIVE 07/04/2023 1141   UROBILINOGEN 0.2 08/31/2019 1136   NITRITE NEGATIVE 07/04/2023 1141   LEUKOCYTESUR NEGATIVE 07/04/2023 1141   Sepsis Labs: Invalid input(s): PROCALCITONIN, LACTICIDVEN  Microbiology: No results found for this or any previous visit (from the past 240 hours).   Radiology Studies: No results found.    Kellen Dutch T. Ahava Kissoon [facility]  If 7PM-7AM, please contact night-coverage [email or web address] 02/16/2024, 1:24 PM

## 2024-02-16 NOTE — Progress Notes (Signed)
 PHARMACY - TOTAL PARENTERAL NUTRITION CONSULT NOTE   Indication: intolerance to enteral feedings, esophageal perforation  Patient Measurements: Height: 6' 1 (185.4 cm) Weight: 70.1 kg (154 lb 8 oz) IBW/kg (Calculated) : 79.9 TPN AdjBW (KG): 80.6 Body mass index is 20.38 kg/m.  Assessment:  73 year old man with medical history significant for cardiac arrest earlier this year who was admitted after several days of nausea and vomiting followed by sudden onset severe tearing epigastric pain radiating to back and chest. 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 Pharmacy consulted to initiate TPN due to inability to tolerate enteral nutrition at this time.  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 PTA takes torsemide  20 mg PO daily, now held. Aggressive fluid resuscitation recommended per GI. Per HF, patient currently euvolemic but will hold diuretic for now. Due to CHF will target lower end of fluid range for TPN and supplement additional fluids per MD outside of TPN to prevent fluid overload.   Stent placed on 10/6, esophogram not showing any leak. Diet advanced to CLD. 10/15 Leak present and made NPO. 10/24 esophagram with persistent leak; will need stent replacement in 3-4 weeks.   10/28 AM update:  Patient has weights that are significantly more than admission weight of 72.7 kg on 10/5. This may represent false weights from bed scale.  Current weight is 71.6 kg  Glucose / Insulin : A1C 6.0% (02/2023);  BG <140, 13 units insulin  in TPN  Electrolytes: Na 143, Cl 104, K 3.7, CO2 27 Ca 9.4 [CoCa 11.16], Phos 4.3, Mg 2 Renal: Scr 2.0 (BL ~2), BUN 73 Hepatic: AST/ALT/tbili/alk phos wnl, alb 1.8, TG 41 Intake / Output; MIVF: furosemide  160 mg IV daily starting 10/28; UOP 2.05 mL/kg/hr, chest tube 0 ml, LBM 10/30 [2+ unmeasured stool] Net IO Since Admission: 3,893.2 mL [02/16/24 0957]  GI Imaging: 10/05 CT: concerning for esophageal perforation,  masslike area of soft tissue thickening adjacent to and surrounding the esophagus  10/06 Esoph XR: distal esophageal perforation 10/7 Esoph XR: No leak associated with stent coverage of esophageal perforation 10/14 KUB: Gas-filled loops of small bowel and colon suggestive of ileus 10/15 Esoph XR: esophageal perforation/leak at the distal esophagus, proximal to the esophageal stent 10/24 esophagram: persistent leak despite being covered by the esophageal stent.  GI Surgeries / Procedures:  10/6: esophageal stent placement  10/15 EGD and stent repositioning   Central access: 01/22/24 TPN start date: 01/22/24  Nutritional Goals:  Goal concentrated TPN is 70 ml/hr (provides 116 g of protein and 2301 kcals per day  RD Assessment:  Estimated Needs Total Energy Estimated Needs: 2300-2500 kcals Total Protein Estimated Needs: 115-130 g Total Fluid Estimated Needs: 2L  Current Nutrition:  TPN  10/13- 10/14 DYS1: not taking any po  10/15: NPO d/t increased abd distension  10/22 Propofol  at 7.6- 20.2 ml/hr, providing ~370 kcals/24hr- OFF 10/23: switch propofol  back to dexmedetomidine - OFF   Plan:  Continue concentrated TPN at goal 70 mL/hr, providing 100% estimated nutritional needs   Electrolytes in TPN: Na 0 mEq/L, K 60 mEq/L, Ca 0 mEq/L, Mg 0 mEq/L, Phos 10 mmol/L. Cl:Ac max acetate (cannot do 1:1 or 1:2 due to no Na in bag ) Continue standard MVI and trace elements to TPN K runs iv x3 Continue 13 units insulin  regular to TPN Monitor TPN labs on Mon/Thurs, and PRN    Thank you for involving pharmacy in this patient's care.   Marquarius Lofton BS, PharmD, BCPS Clinical Pharmacist  02/16/2024 7:00 AM  Contact: [phone] after 3 PM

## 2024-02-16 NOTE — Progress Notes (Signed)
   02/16/24 1841  What Happened  Was fall witnessed? No  Was patient injured? Unsure  Patient found on floor  Found by Staff-comment [facility] Darci Lykins RN)  Stated prior activity other (comment) (In chair)  Provider Notification  Provider Name/Title Kathrin Simmer MD  Date Provider Notified 02/16/24  Time Provider Notified 1842  Method of Notification Page  Notification Reason Fall  Provider response Evaluate remotely  Date of Provider Response 02/16/24  Time of Provider Response 1847  Follow Up  Family notified No - patient refusal (patient alert and oriented.)  Time family notified 1842  Progress note created (see row info) Yes  Adult Fall Risk Assessment  Risk Factor Category (scoring not indicated) High fall risk per protocol (document High fall risk)  Age 73  Fall History: Fall within 6 months prior to admission 5  Elimination; Bowel and/or Urine Incontinence 2  Elimination; Bowel and/or Urine Urgency/Frequency 2  Medications: includes PCA/Opiates, Anti-convulsants, Anti-hypertensives, Diuretics, Hypnotics, Laxatives, Sedatives, and Psychotropics 5  Patient Care Equipment 3  Mobility-Assistance 2  Mobility-Gait 2  Mobility-Sensory Deficit 0  Altered awareness of immediate physical environment 0  Impulsiveness 2  Lack of understanding of one's physical/cognitive limitations 0  Total Score 25  Patient Fall Risk Level High fall risk  Adult Fall Risk Interventions  Required Bundle Interventions *See Row Information* High fall risk  Additional Interventions Use of appropriate toileting equipment (bedpan, BSC, etc.)  Fall intervention(s) refused/Patient educated regarding refusal Nonskid socks;Open door if unsupervised;Yellow bracelet  Screening for Fall Injury Risk (To be completed on HIGH fall risk patients) - Assessing Need for Floor Mats  Risk For Fall Injury- Criteria for Floor Mats Noncompliant with safety precautions  Will Implement Floor Mats Yes  Vitals  Temp 97.6 F  (36.4 C)  Temp Source Oral  BP (!) 128/98  MAP (mmHg) 106  BP Location Left Arm  BP Method Automatic  Patient Position (if appropriate) Lying  Pulse Rate 68  Pulse Rate Source Monitor  ECG Heart Rate 81  Cardiac Rhythm NSR  Resp (!) 21  Oxygen  Therapy  SpO2 98 %  O2 Device ETCO2 Nasal Cannula  O2 Flow Rate (L/min) 2 L/min  Pain Assessment  Pain Scale 0-10  Pain Score 8  Pain Type Acute pain  Pain Location Back  Pain Intervention(s) Medication (See eMAR)  Neurological  Neuro (WDL) WDL  Level of Consciousness Alert  Orientation Level Oriented X4  Cognition Poor judgement;Poor safety awareness  Speech Clear  Neuro Symptoms Anxiety  Musculoskeletal  Musculoskeletal (WDL) X  Assistive Device Front wheel walker  Generalized Weakness Yes

## 2024-02-16 NOTE — Progress Notes (Addendum)
 Advanced Heart Failure Rounding Note  Cardiologist: Dorn Lesches, MD  Chief Complaint: S/p esophageal stent Subjective:    - S/p EGD with stent repositioning by Dr. Shyrl 10/15  - Limited echo 02/08/24 EF 60-65%  - Barium swallow 02/09/24 persistent leak   Still getting TPN, I/Os Net negative yesterday 1.3L. Wt down 2 lb. CVP 2.   Scr 1.86>>2.00    Objective:    Weight Range: 70.1 kg Body mass index is 20.38 kg/m.   Vital Signs:   Temp:  [97.6 F (36.4 C)-98.2 F (36.8 C)] 98.1 F (36.7 C) (10/31 1202) Pulse Rate:  [60-69] 64 (10/31 1202) Resp:  [12-20] 19 (10/31 1202) BP: (125-151)/(61-78) 137/64 (10/31 1202) SpO2:  [97 %-99 %] 98 % (10/31 1202) FiO2 (%):  [28 %] 28 % (10/30 2000) Last BM Date : 02/16/24  Weight change: Filed Weights   02/12/24 0500 02/14/24 0329 02/15/24 0500  Weight: 71.6 kg 70.8 kg 70.1 kg   Intake/Output:  Intake/Output Summary (Last 24 hours) at 02/16/2024 1223 Last data filed at 02/16/2024 1217 Gross per 24 hour  Intake 1956.46 ml  Output 3850 ml  Net -1893.54 ml    Physical Exam   General: fatigued looking, NAD   Cardiac: JVD not elevated, regular rate and rhythm. No MBR  Resp: clear, + CT Extremities: warm and dry. No LEE. + RUE PICC  Neuro: alert and oriented, moves all 4 ext w/o difficulty   Telemetry   NSR 60s (personally reviewed)  Labs    CBC Recent Labs    02/15/24 0430 02/16/24 0440  WBC 14.8* 12.3*  HGB 10.7* 10.8*  HCT 34.0* 34.7*  MCV 94.4 88.3  PLT 291 289   Basic Metabolic Panel Recent Labs    89/69/74 0601 02/16/24 0440  NA 145 143  K 3.9 3.7  CL 102 104  CO2 27 27  GLUCOSE 146* 109*  BUN 74* 73*  CREATININE 1.86* 2.00*  CALCIUM  9.6 9.4  MG 2.0 2.0  PHOS 4.1 4.3   Liver Function Tests Recent Labs    02/15/24 0601 02/16/24 0440  AST 15  --   ALT 19  --   ALKPHOS 95  --   BILITOT 0.5  --   PROT 7.1  --   ALBUMIN  1.8* 1.8*   BNP (last 3 results) Recent Labs     03/09/23 1544 03/11/23 0218 06/27/23 1116  BNP 660.9* 491.3* 646.4*   Hemoglobin A1C No results for input(s): HGBA1C in the last 72 hours.  Fasting Lipid Panel No results for input(s): CHOL, HDL, LDLCALC, TRIG, CHOLHDL, LDLDIRECT in the last 72 hours.  Medications:    Scheduled Medications:  bisacodyl   10 mg Rectal Q0600   Chlorhexidine  Gluconate Cloth  6 each Topical Daily   HYDROmorphone    Intravenous Q4H   lidocaine   1 patch Transdermal Q24H   pantoprazole  (PROTONIX ) IV  40 mg Intravenous Q12H   sodium chloride  flush  10 mL Intrapleural Q8H   sodium chloride  flush  10-40 mL Intracatheter Q12H    Infusions:  amiodarone  30 mg/hr (02/16/24 1016)   fluconazole (DIFLUCAN) IV 200 mg (02/16/24 1021)   furosemide  160 mg (02/16/24 1018)   heparin  1,950 Units/hr (02/16/24 0559)   piperacillin -tazobactam (ZOSYN )  IV 12.5 mL/hr at 02/16/24 0559   potassium chloride  10 mEq (02/16/24 1147)   TPN ADULT (ION) 70 mL/hr at 02/16/24 0559   TPN ADULT (ION)      PRN Medications: diphenhydrAMINE **OR** diphenhydrAMINE, [name] butt cream, hydrALAZINE ,  levalbuterol, LORazepam , naloxone  **AND** sodium chloride  flush, ondansetron  (ZOFRAN ) IV, mouth rinse, sodium chloride  flush  Patient Profile    Patient with longstanding history of chronic heart failure with preserved ejection fraction, RV failure with history of pulmonary embolism presents with esophageal perforation.  S/P Stent Esophageal Stent. Stent repositioned 10/15.  Assessment/Plan   1.  Esophageal Perforation - Boerhaave syndrome.  - CT C/A/P: with pneumomediastinum and large mass-like density in lower esophagus - 10/25 S/P EGD with esophageal stent placed  - Stent migrated w/ leak>back to OR 10/15 for repositioning  - Barium swallow 10/24 persistent leak  - On TPN; Strict NPO - Continues on fluconazole; abx escalated d/t PNA, now on Zosyn  + Meropenum. Primary managing. - spoke with Dr. Shyrl about  timing of possible esophageal stent exchange +/- PEG tube. He will see him to re-evaluate.   2. Acute on chronic HFpEF: h/o cardiogenic shock with RV failure in the setting of cardiac arrest thought to be caused by acute PE vs ACS in 3/25. Echo at the time showed EF 55%, severe, LVH, G2DD, and severely reduced RV function with strain. There was concern for cardiac sarcoid amyloid, however CMR LGE consistent with ischemic disease. RV on echo 9/25 with complete recovery, EF 60-65%, nl RV function.    - RHC 3/25: Mild PAH with severe RV failure though CO is preserved.   - Repeat echo 10/24 EF 60-65% - Euvolemic on exam. Weight stable. CVP 2  - continue IV lasix  daily with TPN to keep even, reduce to 120 mg daily  - switch to PO Lasix  once able to tolerate POs - Continue TED hose  3. 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 Medical management.  - suspect CP related to esophageal issues and not CAD - strict NPO, holding PO meds   4.  AKI on CKD Stage 3b: Baseline sCr ~ 2.1. - Creatinine peaked to 2.6.  - overall improved, SCr 2.0 today     5. PAF:   - s/p TEE/DCCV 3/25 - has converted to AF a couple times this admit - in NSR on tele   - continue IV amio (NPO) and heparin   6.  H/o DVT/PE:  Bilateral upper and lower DVTs, mixed acute and chronic.  - s/p infrarenal IVC placement 3/25 - V/Q 3/25 with multiple perfusion defects - continue IV heparin . Will need lifelong AC, transition to Eliquis  once taking PO - no change   7. Carotid stenosis: h/o CVA. TCAR in 6/24. Okay to remain off plavix  at this point. - carotid US  9/25: widely patent carotid stent on R and no stenosis on L   8. Acute Hypoxic Respiratory Failure/PNA/ Exudative Lt Pleural Effusion  - on zosyn  and merom - CT placed for pleural effusion. Resp Cx w/ rare yeast, BCx NGTD  - remains on [place]  9. Severe protein calorie malnutrition - Nutrition following. On  TPN   Caffie Shed, PA-C 02/16/2024  Advanced Heart Failure Team Pager [phone] (M-F; 7a - 5p)  Please contact [facility] for night-coverage after hours (4p -7a ) and weekends on [email or web address]  Patient seen and examined with the above-signed Advanced Practice Provider and/or Housestaff. I personally reviewed laboratory data, imaging studies and relevant notes. I independently examined the patient and formulated the important aspects of the plan. I have edited the note to reflect any of my changes or salient points. I have personally discussed the plan with the  patient and/or family.  Frustrated about not being able to eat and protracted course. Pain controlled.  Remains in NSR on IV amio.   Remains on IV lasix  160 VI daily. Scr up slightly. CPV low  General:  Sitting up in chair No resp difficulty HEENT: normal Neck: supple. no JVD.  Cor: Regular rate & rhythm. No rubs, gallops or murmurs. Lungs: clear Abdomen: soft, nontender, nondistended.Good bowel sounds. Extremities: no cyanosis, clubbing, rash, edema Neuro: alert & orientedx3, cranial nerves grossly intact. moves all 4 extremities w/o difficulty. Affect pleasant  Still unable to take pos. Will continue IV amio and heparin  for now. Can cut lasix  back to 120 IV daily.   I discussed case with Dr. Shyrl who will see him to discuss possible esophageal stent replacement + PEG tube.   AHF team will see again Monday  Toribio Fuel, MD  12:38 PM

## 2024-02-17 DIAGNOSIS — I5023 Acute on chronic systolic (congestive) heart failure: Secondary | ICD-10-CM | POA: Diagnosis not present

## 2024-02-17 DIAGNOSIS — E43 Unspecified severe protein-calorie malnutrition: Secondary | ICD-10-CM | POA: Diagnosis not present

## 2024-02-17 DIAGNOSIS — K223 Perforation of esophagus: Secondary | ICD-10-CM | POA: Diagnosis not present

## 2024-02-17 DIAGNOSIS — J90 Pleural effusion, not elsewhere classified: Secondary | ICD-10-CM | POA: Diagnosis not present

## 2024-02-17 LAB — RENAL FUNCTION PANEL
Albumin: 2 g/dL — ABNORMAL LOW (ref 3.5–5.0)
Anion gap: 12 (ref 5–15)
BUN: 73 mg/dL — ABNORMAL HIGH (ref 8–23)
CO2: 27 mmol/L (ref 22–32)
Calcium: 9.7 mg/dL (ref 8.9–10.3)
Chloride: 104 mmol/L (ref 98–111)
Creatinine, Ser: 2.06 mg/dL — ABNORMAL HIGH (ref 0.61–1.24)
GFR, Estimated: 34 mL/min — ABNORMAL LOW (ref >60)
Glucose, Bld: 131 mg/dL — ABNORMAL HIGH (ref 70–99)
Phosphorus: 4.1 mg/dL (ref 2.5–4.6)
Potassium: 4.1 mmol/L (ref 3.5–5.1)
Sodium: 143 mmol/L (ref 135–145)

## 2024-02-17 LAB — MAGNESIUM: Magnesium: 1.9 mg/dL (ref 1.7–2.4)

## 2024-02-17 LAB — CBC
HCT: 35.9 % — ABNORMAL LOW (ref 39.0–52.0)
Hemoglobin: 11.3 g/dL — ABNORMAL LOW (ref 13.0–17.0)
MCH: 28 pg (ref 26.0–34.0)
MCHC: 31.5 g/dL (ref 30.0–36.0)
MCV: 89.1 fL (ref 80.0–100.0)
Platelets: 281 K/uL (ref 150–400)
RBC: 4.03 MIL/uL — ABNORMAL LOW (ref 4.22–5.81)
RDW: 18.7 % — ABNORMAL HIGH (ref 11.5–15.5)
WBC: 12.2 K/uL — ABNORMAL HIGH (ref 4.0–10.5)
nRBC: 0 % (ref 0.0–0.2)

## 2024-02-17 LAB — HEPARIN LEVEL (UNFRACTIONATED)
Heparin Unfractionated: 0.63 [IU]/mL (ref 0.30–0.70)
Heparin Unfractionated: 0.42 [IU]/mL (ref 0.30–0.70)

## 2024-02-17 MED ORDER — TRACE MINERALS CU-MN-SE-ZN 300-55-60-3000 MCG/ML IV SOLN: INTRAVENOUS | Status: DC

## 2024-02-17 MED ORDER — DICLOFENAC SODIUM 1 % EX GEL
2.0000 g | Freq: Four times a day (QID) | CUTANEOUS | Status: DC | PRN
  Administered 2024-02-17 – 2024-02-26 (×3): 2 g via TOPICAL
  Filled 2024-02-17: qty 100

## 2024-02-17 MED ORDER — MAGNESIUM SULFATE 2 GM/50ML IV SOLN
2.0000 g | Freq: Once | INTRAVENOUS | Status: AC
  Administered 2024-02-17: 2 g via INTRAVENOUS
  Filled 2024-02-17: qty 50

## 2024-02-17 MED ORDER — TRACE MINERALS CU-MN-SE-ZN 300-55-60-3000 MCG/ML IV SOLN
INTRAVENOUS | Status: AC
  Filled 2024-02-17: qty 772.8

## 2024-02-17 NOTE — Plan of Care (Signed)
   Problem: Health Behavior/Discharge Planning: Goal: Ability to manage health-related needs will improve Outcome: Progressing   Problem: Clinical Measurements: Goal: Ability to maintain clinical measurements within normal limits will improve Outcome: Progressing   Problem: Clinical Measurements: Goal: Will remain free from infection Outcome: Progressing

## 2024-02-17 NOTE — Progress Notes (Signed)
 PHARMACY - TOTAL PARENTERAL NUTRITION CONSULT NOTE   Indication: intolerance to enteral feedings, esophageal perforation  Patient Measurements: Height: 6' 1 (185.4 cm) Weight: 68.8 kg (151 lb 10.8 oz) IBW/kg (Calculated) : 79.9 TPN AdjBW (KG): 80.6 Body mass index is 20.01 kg/m.  Assessment:  73 year old man with medical history significant for cardiac arrest earlier this year who was admitted after several days of nausea and vomiting followed by sudden onset severe tearing epigastric pain radiating to back and chest. 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 Pharmacy consulted to initiate TPN due to inability to tolerate enteral nutrition at this time.  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 PTA takes torsemide  20 mg PO daily, now held. Aggressive fluid resuscitation recommended per GI. Per HF, patient currently euvolemic but will hold diuretic for now. Due to CHF will target lower end of fluid range for TPN and supplement additional fluids per MD outside of TPN to prevent fluid overload.   Stent placed on 10/6, esophogram not showing any leak. Diet advanced to CLD. 10/15 Leak present and made NPO. 10/24 esophagram with persistent leak; will need stent replacement in 3-4 weeks.   10/28 AM update: patient has weights that are significantly more than admission weight of 72.7 kg on 10/5. This may represent false weights from bed scale.  Current weight is 71.6 kg  Glucose / Insulin : A1C 6.0% (02/2023);  BG <140, 13 units insulin  in TPN  Electrolytes: Na 143, Cl 104, K 4.1 s/p 30 mEq, CO2 27, Ca 9.7 [CoCa 11.3], Phos 4.1, Mg 1.9  Renal: SCr 2.06 (BL ~2), BUN 73 Hepatic: AST/ALT/tbili/alk phos wnl, alb 2, TG 41 Intake / Output; MIVF: furosemide  down 120 mg IV daily starting 11/1; UOP 1.4 mL/kg/hr, chest tube 55 ml, LBM 10/30 [2+ unmeasured stool] Net IO Since Admission: 3,243.42 mL [02/17/24 0651]  GI Imaging: 10/05 CT: concerning for  esophageal perforation, masslike area of soft tissue thickening adjacent to and surrounding the esophagus  10/06 Esoph XR: distal esophageal perforation 10/7 Esoph XR: No leak associated with stent coverage of esophageal perforation 10/14 KUB: Gas-filled loops of small bowel and colon suggestive of ileus 10/15 Esoph XR: esophageal perforation/leak at the distal esophagus, proximal to the esophageal stent 10/24 esophagram: persistent leak despite being covered by the esophageal stent.  GI Surgeries / Procedures:  10/6: esophageal stent placement  10/15 EGD and stent repositioning   Central access: 01/22/24 TPN start date: 01/22/24  Nutritional Goals:  Goal concentrated TPN is 70 ml/hr (provides 116 g of protein and 2301 kcals per day  RD Assessment:  Estimated Needs Total Energy Estimated Needs: 2300-2500 kcals Total Protein Estimated Needs: 115-130 g Total Fluid Estimated Needs: 2L  Current Nutrition:  TPN  10/13- 10/14 DYS1: not taking any po  10/15: NPO d/t increased abd distension  10/22 Propofol  at 7.6- 20.2 ml/hr, providing ~370 kcals/24hr- OFF 10/23: switch propofol  back to dexmedetomidine - OFF  Plan:  Continue concentrated TPN at goal 70 mL/hr, providing 100% estimated nutritional needs   Electrolytes in TPN: Na 0 mEq/L, K 60 mEq/L, Ca 0 mEq/L, add Mg 5 mEq/L, Phos 10 mmol/L. Cl:Ac max acetate (cannot do 1:1 or 1:2 due to no Na in bag ) Continue standard MVI and trace elements to TPN Mg 2 g IV ordered per MD Continue 13 units insulin  regular to TPN Monitor TPN labs on Mon/Thurs, and PRN   Thank you for involving pharmacy in this patient's care.  [facility],  PharmD, BCPS Clinical Pharmacist Clinical phone for 02/17/2024 is [phone] 02/17/2024 6:51 AM

## 2024-02-17 NOTE — Progress Notes (Addendum)
 PROGRESS NOTE  Billy [name] FMW:[id number] DOB: [birth year]   PCP: Seabron Lenis, MD  Patient is from: Home.  DOA: 01/21/2024 LOS: 27  Chief complaints Chief Complaint  Patient presents with   Abdominal Pain   Emesis     Brief Narrative / Interim history: 73 year old M with PMH of cardiac arrest earlier this year along with HFrEF, Afib, PE presented with several days of nausea and vomiting from a stomach bug followed by sudden onset severe tearing epigastric pain radiating to back and chest. Workup revealed esophageal tear/Boerhaave.  Patient had esophageal stent placed on 10/7.  Subsequent swallow study revealed an additional leak, and he had stent repositioned on 01/31/2024.  He became hypoxic with gastric contents noted on intubation, and he was transferred to ICU for mechanical ventilation.  Bedside ultrasound with right pleural effusion for which he had chest tube placed with removal of 300 cc exudative fluid.  He was also successfully extubated on 10/16.  Patient has had increased oxygen  requirement requiring reintubation on 10/20.  Bedside ultrasound showed left-sided pleural effusion and he had left-sided chest tube with 120 cc exudative output. ICU stay complicated by high O2 requirement, left sided pleural effusion, chest tube placement 10/16, reintubated 10/20.  Eventually, patient was extubated and transferred out of ICU on 10/28.  Hospital course complicated by acute on chronic HFpEF for which she is on IV Lasix  per advanced heart failure team.  Subjective: Seen and examined earlier this morning.  No major events overnight or this morning.  Sitting on bedside chair.  No complaints.    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 on TPN for nutrition.  Some discussion about PEG tube -Bilateral chest  tube in place.  Cardiothoracic following.  -Now on IV Zosyn  per CVTS. -Stent replacement/exchange every 3-4 weeks.    Acute hypoxic/ hypercapnic respiratory failure-multifactorial including aspiration pneumonia, pleural effusion and CHF.  Trach aspirate on 10/15 with Candida albicans. -IV meropenem>> IV Zosyn  for aspiration pneumonia. -On Diflucan for Candida albicans from trach aspirate 10/15>> -Chest tube for pleural effusion per CTS. -IV Lasix  per advanced heart failure team -Continue bronchodilators   Acute on chronic diastolic CHF-Limited TTE on 10/23 with LVEF of 60 to 65%.  RHC in 3/25 with mild PAH with severe RV failure but preserved CO.  Appears euvolemic on exam. -On IV Lasix  120 mg daily per advanced heart failure team while on TPN. 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  AKI seems to have resolved. -Continue monitoring   Paroxysmal A-fib: s/p TEE DCCV 3/25.  Seems to be in sinus rhythm. -Amiodarone  drip per cardiology -On heparin  for anticoagulation -Optimize electrolyte   Intractable pain-reports a little pain in his left chest at chest tube site but rates his pain 11/10.  Required ketamine while in ICU.  Currently on IV Dilaudid  PCA. -Continue Dilaudid  PCA.  H/o PE/ DVT- s/p infrarenal IVC placement 3/25. V/Q 3/25 with multiple perfusion defects - Continue IV heparin .  Essential hypertension: SBP in 150s. -Pain control as above -Diuretics as above. -IV hydralazine  as needed  Unwitnessed fall in the hospital: Found down on the floor the evening of 10/31.  No apparent injury or focal neurodeficit. - Fall precaution  Anxiety disorder: Stable. -Ativan  as needed   Acute on chronic anemia -Monitor H&H.   Leukocytosis: Improved. - Antibiotics as  above.  GERD - IV   Goal of care: Noted DNR paperwork in [facility] but patient confirms full CODE STATUS.SABRA   Severe protein calorie  malnutrition/hypoalbuminemia Body mass index is 20.01 kg/m. Nutrition Problem: Severe Malnutrition Etiology: acute illness (may have chronic component due to time frame of reported wt loss) Signs/Symptoms: severe fat depletion, moderate muscle depletion Interventions: Refer to RD note for recommendations  Pressure skin injury Wound 02/07/24 0900 Pressure Injury Foot Anterior;Right Stage 1 -  Intact skin with non-blanchable redness of a localized area usually over a bony prominence. (Active)     Wound 02/07/24 0900 Pressure Injury Foot Anterior;Left Stage 1 -  Intact skin with non-blanchable redness of a localized area usually over a bony prominence. (Active)     Wound 02/07/24 0900 Pressure Injury Buttocks Right Stage 1 -  Intact skin with non-blanchable redness of a localized area usually over a bony prominence. (Active)   DVT prophylaxis:  SCDs Start: 01/21/24 2023  Code Status: Full code Family Communication: None at bedside today. Level of care: Telemetry Cardiac Status is: Inpatient Remains inpatient appropriate because: Esophageal rupture, acute CHF,   Final disposition: LTAC?   35 minutes with more than 50% spent in reviewing records, counseling patient/family and coordinating care.  Consultants:  Gastroenterology Critical care Cardiology Cardiothoracic surgery  Procedures: See above.  Microbiology summarized: 10/5-MRSA PCR screen positive 10/15-trach aspirate culture with Candida albicans.  Objective: Vitals:   02/17/24 0203 02/17/24 0400 02/17/24 0832 02/17/24 0837  BP: (!) 140/66  (!) 155/67   Pulse: 65  64   Resp: 20 18 18 15   Temp: 98.3 F (36.8 C)  97.8 F (36.6 C)   TempSrc: Oral  Oral   SpO2: 94% 94% 99%   Weight: 68.8 kg     Height:        Examination:  GENERAL: No apparent distress.  Nontoxic. HEENT: MMM.  Vision and hearing grossly intact.  NECK: Supple.  No apparent JVD.  RESP:  No IWOB.  Fair aeration bilaterally.  Chest tube to left  chest. CVS:  RRR. Heart sounds normal.  ABD/GI/GU: BS+. Abd soft, NTND.  MSK/EXT:  Moves extremities. No apparent deformity. No edema.  SKIN: no apparent skin lesion or wound NEURO: AA.  Oriented appropriately.  No apparent focal neuro deficit. PSYCH: Calm. Normal affect.   Sch Meds:  Scheduled Meds:  bisacodyl   10 mg Rectal Q0600   Chlorhexidine  Gluconate Cloth  6 each Topical Daily   [name] butt cream   Topical BID   HYDROmorphone    Intravenous Q4H   lidocaine   1 patch Transdermal Q24H   pantoprazole  (PROTONIX ) IV  40 mg Intravenous Q12H   sodium chloride  flush  10 mL Intrapleural Q8H   sodium chloride  flush  10-40 mL Intracatheter Q12H   Continuous Infusions:  amiodarone  30 mg/hr (02/17/24 1125)   fluconazole (DIFLUCAN) IV 200 mg (02/17/24 0954)   furosemide  120 mg (02/17/24 0958)   heparin  1,950 Units/hr (02/17/24 9366)   piperacillin -tazobactam (ZOSYN )  IV 3.375 g (02/17/24 0544)   TPN ADULT (ION) 70 mL/hr at 02/17/24 0600   TPN ADULT (ION)     PRN Meds:.diphenhydrAMINE **OR** diphenhydrAMINE, hydrALAZINE , levalbuterol, LORazepam , naloxone  **AND** sodium chloride  flush, ondansetron  (ZOFRAN ) IV, mouth rinse, sodium chloride  flush  Antimicrobials: Anti-infectives (From admission, onward)    Start     Dose/Rate Route Frequency Ordered Stop   02/13/24 0900  piperacillin -tazobactam (ZOSYN ) IVPB 3.375 g        3.375 g 12.5 mL/hr over 240  Minutes Intravenous Every 8 hours 02/13/24 0812     02/07/24 0800  vancomycin (VANCOCIN) IVPB 1000 mg/200 mL premix  Status:  Discontinued        1,000 mg 200 mL/hr over 60 Minutes Intravenous Every 24 hours 02/07/24 0632 02/09/24 0905   02/06/24 2200  meropenem (MERREM) 1 g in sodium chloride  0.9 % 100 mL IVPB  Status:  Discontinued        1 g 200 mL/hr over 30 Minutes Intravenous Every 12 hours 02/06/24 1127 02/13/24 0812   02/06/24 1300  vancomycin (VANCOREADY) IVPB 1750 mg/350 mL  Status:  Discontinued        1,750 mg 175 mL/hr  over 120 Minutes Intravenous Every 48 hours 02/04/24 1225 02/04/24 1226   02/06/24 0800  vancomycin (VANCOCIN) IVPB 1000 mg/200 mL premix        1,000 mg 200 mL/hr over 60 Minutes Intravenous  Once 02/06/24 0629 02/07/24 0829   02/04/24 1400  meropenem (MERREM) 1 g in sodium chloride  0.9 % 100 mL IVPB  Status:  Discontinued        1 g 200 mL/hr over 30 Minutes Intravenous Every 8 hours 02/04/24 1208 02/06/24 1127   02/04/24 1300  linezolid  (ZYVOX ) IVPB 600 mg  Status:  Discontinued        600 mg 300 mL/hr over 60 Minutes Intravenous Every 12 hours 02/04/24 1202 02/04/24 1208   02/04/24 1300  vancomycin (VANCOREADY) IVPB 2000 mg/400 mL        2,000 mg 200 mL/hr over 120 Minutes Intravenous  Once 02/04/24 1208 02/04/24 1623   02/04/24 1208  vancomycin variable dose per unstable renal function (pharmacist dosing)  Status:  Discontinued         Does not apply See admin instructions 02/04/24 1208 02/07/24 0632   01/31/24 1030  fluconazole (DIFLUCAN) IVPB 200 mg        200 mg 100 mL/hr over 60 Minutes Intravenous Daily 01/31/24 0938     01/30/24 1445  piperacillin -tazobactam (ZOSYN ) IVPB 3.375 g  Status:  Discontinued        3.375 g 12.5 mL/hr over 240 Minutes Intravenous Every 8 hours 01/30/24 1354 02/04/24 1203   01/26/24 1000  amoxicillin-clavulanate (AUGMENTIN) 400-57 MG/5ML suspension 875 mg  Status:  Discontinued        875 mg Oral Every 12 hours 01/26/24 0658 01/30/24 1354   01/26/24 1000  fluconazole (DIFLUCAN) 40 MG/ML suspension 200 mg  Status:  Discontinued        200 mg Oral Daily 01/26/24 0658 01/31/24 0938   01/22/24 1445  fluconazole (DIFLUCAN) IVPB 400 mg  Status:  Discontinued        400 mg 100 mL/hr over 120 Minutes Intravenous Every 24 hours 01/22/24 1436 01/22/24 1439   01/22/24 1445  fluconazole (DIFLUCAN) IVPB 200 mg  Status:  Discontinued        200 mg 100 mL/hr over 60 Minutes Intravenous Every 24 hours 01/22/24 1439 01/26/24 0658   01/21/24 2200   piperacillin -tazobactam (ZOSYN ) IVPB 3.375 g  Status:  Discontinued       Placed in Followed by Linked Group   3.375 g 12.5 mL/hr over 240 Minutes Intravenous Every 8 hours 01/21/24 1607 01/26/24 0658   01/21/24 1615  piperacillin -tazobactam (ZOSYN ) IVPB 3.375 g       Placed in Followed by Linked Group   3.375 g 100 mL/hr over 30 Minutes Intravenous  Once 01/21/24 1607 01/21/24 1651        I  have personally reviewed the following labs and images: CBC: Recent Labs  Lab 02/13/24 0513 02/14/24 0548 02/15/24 0430 02/16/24 0440 02/17/24 0400  WBC 13.4* 14.4* 14.8* 12.3* 12.2*  HGB 10.9* 10.5* 10.7* 10.8* 11.3*  HCT 35.5* 33.7* 34.0* 34.7* 35.9*  MCV 95.2 88.2 94.4 88.3 89.1  PLT 355 321 291 289 281   BMP &GFR Recent Labs  Lab 02/13/24 0637 02/14/24 0548 02/15/24 0601 02/16/24 0440 02/17/24 0400  NA 144 145 145 143 143  K 3.6 4.1 3.9 3.7 4.1  CL 105 107 102 104 104  CO2 28 27 27 27 27   GLUCOSE 157* 148* 146* 109* 131*  BUN 86* 80* 74* 73* 73*  CREATININE 1.72* 1.79* 1.86* 2.00* 2.06*  CALCIUM  9.2 9.4 9.6 9.4 9.7  MG 2.2 2.2 2.0 2.0 1.9  PHOS 4.1 4.1 4.1 4.3 4.1   Estimated Creatinine Clearance: 32 mL/min (A) (by C-G formula based on SCr of 2.06 mg/dL (H)). Liver & Pancreas: Recent Labs  Lab 02/12/24 0432 02/13/24 9362 02/14/24 0548 02/15/24 0601 02/16/24 0440 02/17/24 0400  AST 17  --   --  15  --   --   ALT 20  --   --  19  --   --   ALKPHOS 95  --   --  95  --   --   BILITOT 0.5  --   --  0.5  --   --   PROT 6.4*  --   --  7.1  --   --   ALBUMIN  1.5*  1.5* 1.7* 1.7* 1.8* 1.8* 2.0*   No results for input(s): LIPASE, AMYLASE in the last 168 hours. No results for input(s): AMMONIA in the last 168 hours. Diabetic: No results for input(s): HGBA1C in the last 72 hours. Recent Labs  Lab 02/11/24 2312 02/12/24 0442 02/12/24 0725 02/12/24 1120 02/12/24 1601  GLUCAP 145* 130* 131* 130* 137*   Cardiac Enzymes: No results for input(s):  CKTOTAL, CKMB, CKMBINDEX, TROPONINI in the last 168 hours. No results for input(s): PROBNP in the last 8760 hours. Coagulation Profile: No results for input(s): INR, PROTIME in the last 168 hours. Thyroid  Function Tests: No results for input(s): TSH, T4TOTAL, FREET4, T3FREE, THYROIDAB in the last 72 hours. Lipid Profile: No results for input(s): CHOL, HDL, LDLCALC, TRIG, CHOLHDL, LDLDIRECT in the last 72 hours.  Anemia Panel: No results for input(s): VITAMINB12, FOLATE, FERRITIN, TIBC, IRON , RETICCTPCT in the last 72 hours. Urine analysis:    Component Value Date/Time   COLORURINE YELLOW 07/04/2023 1141   APPEARANCEUR HAZY (A) 07/04/2023 1141   LABSPEC 1.010 07/04/2023 1141   PHURINE 5.0 07/04/2023 1141   GLUCOSEU NEGATIVE 07/04/2023 1141   HGBUR NEGATIVE 07/04/2023 1141   BILIRUBINUR NEGATIVE 07/04/2023 1141   KETONESUR NEGATIVE 07/04/2023 1141   PROTEINUR NEGATIVE 07/04/2023 1141   UROBILINOGEN 0.2 08/31/2019 1136   NITRITE NEGATIVE 07/04/2023 1141   LEUKOCYTESUR NEGATIVE 07/04/2023 1141   Sepsis Labs: Invalid input(s): PROCALCITONIN, LACTICIDVEN  Microbiology: No results found for this or any previous visit (from the past 240 hours).   Radiology Studies: No results found.    Maitlyn Penza T. Jyasia Markoff [facility]  If 7PM-7AM, please contact night-coverage [email or web address] 02/17/2024, 11:41 AM

## 2024-02-17 NOTE — Progress Notes (Signed)
 PHARMACY - ANTICOAGULATION CONSULT NOTE  Pharmacy Consult for heparin  infusion Indication: atrial fibrillation  Allergies  Allergen Reactions   Norvasc  [Amlodipine ] Swelling and Other (See Comments)    Excessive BLE swelling Skin discoloration, blotching   Jardiance  [Empagliflozin ] Itching and Other (See Comments)    Patient mentioned penile swelling, pain   Morphine And Codeine Itching    Opioid-induced pruritus    Patient Measurements: Height: 6' 1 (185.4 cm) Weight: 68.8 kg (151 lb 10.8 oz) IBW/kg (Calculated) : 79.9 HEPARIN  DW (KG): 80.6  Vital Signs: Temp: 97.8 F (36.6 C) (11/01 0832) Temp Source: Oral (11/01 0832) BP: 155/67 (11/01 0832) Pulse Rate: 64 (11/01 0832)  Labs: Recent Labs    02/15/24 0430 02/15/24 0601 02/16/24 0440 02/17/24 0400 02/17/24 0800  HGB 10.7*  --  10.8* 11.3*  --   HCT 34.0*  --  34.7* 35.9*  --   PLT 291  --  289 281  --   HEPARINUNFRC 0.33  --  0.48 0.63 0.42  CREATININE  --  1.86* 2.00* 2.06*  --     Estimated Creatinine Clearance: 32 mL/min (A) (by C-G formula based on SCr of 2.06 mg/dL (H)).   Assessment: 73 yo M presenting for esophageal rupture, now s/p esophageal stent placement on 10/6. PMH significant for HFrEF, Afib on Eliquis  PTA. Last dose of Eliquis  01/20/24, time unknown. Pharmacy consulted for heparin  management.  10/15 - underwent EGD with stent reposition on 10/15 after finding esophageal leak.   Heparin  level 0.63 is elevated this morning, with an unexpected increase, ordered a repeat. Repeat heparin  level 0.42 is therapeutic with heparin  running at 1950 units/hr. Hgb (11.3) and PLTs (281) are stable. No bleeding per RN.  Pt remains NPO.  Goal of Therapy:  Heparin  level 0.3-0.5 units/ml Monitor platelets by anticoagulation protocol: Yes   Plan:  Continue IV heparin  at 1950 units/hr Monitor heparin  level daily Monitor CBC and s/sx of bleeding daily  Thank you for allowing pharmacy to be a part of this  patient's care.   Nidia Schaffer, PharmD PGY2 Cardiology Pharmacy Resident  Please check AMION for all [facility] Pharmacy phone numbers After 10:00 PM, call Main Pharmacy [phone].

## 2024-02-17 NOTE — Progress Notes (Signed)
 [name] night cross cover note:   Per pt's request, I have ordered prn voltaren gel.     Eva Pore, DO Hospitalist

## 2024-02-18 DIAGNOSIS — E43 Unspecified severe protein-calorie malnutrition: Secondary | ICD-10-CM | POA: Diagnosis not present

## 2024-02-18 DIAGNOSIS — I5023 Acute on chronic systolic (congestive) heart failure: Secondary | ICD-10-CM | POA: Diagnosis not present

## 2024-02-18 DIAGNOSIS — K223 Perforation of esophagus: Secondary | ICD-10-CM | POA: Diagnosis not present

## 2024-02-18 DIAGNOSIS — J90 Pleural effusion, not elsewhere classified: Secondary | ICD-10-CM | POA: Diagnosis not present

## 2024-02-18 LAB — RENAL FUNCTION PANEL
Albumin: 1.8 g/dL — ABNORMAL LOW (ref 3.5–5.0)
Albumin: 2.1 g/dL — ABNORMAL LOW (ref 3.5–5.0)
Anion gap: 12 (ref 5–15)
Anion gap: 14 (ref 5–15)
BUN: 69 mg/dL — ABNORMAL HIGH (ref 8–23)
BUN: 74 mg/dL — ABNORMAL HIGH (ref 8–23)
CO2: 22 mmol/L (ref 22–32)
CO2: 23 mmol/L (ref 22–32)
Calcium: 8.3 mg/dL — ABNORMAL LOW (ref 8.9–10.3)
Calcium: 9.5 mg/dL (ref 8.9–10.3)
Chloride: 98 mmol/L (ref 98–111)
Chloride: 102 mmol/L (ref 98–111)
Creatinine, Ser: 1.91 mg/dL — ABNORMAL HIGH (ref 0.61–1.24)
Creatinine, Ser: 2.18 mg/dL — ABNORMAL HIGH (ref 0.61–1.24)
GFR, Estimated: 37 mL/min — ABNORMAL LOW (ref >60)
GFR, Estimated: 32 mL/min — ABNORMAL LOW (ref >60)
Glucose, Bld: 767 mg/dL (ref 70–99)
Glucose, Bld: 111 mg/dL — ABNORMAL HIGH (ref 70–99)
Phosphorus: 6 mg/dL — ABNORMAL HIGH (ref 2.5–4.6)
Phosphorus: 4.1 mg/dL (ref 2.5–4.6)
Potassium: 6.7 mmol/L (ref 3.5–5.1)
Potassium: 4.2 mmol/L (ref 3.5–5.1)
Sodium: 132 mmol/L — ABNORMAL LOW (ref 135–145)
Sodium: 139 mmol/L (ref 135–145)

## 2024-02-18 LAB — CBC
HCT: 39.2 % (ref 39.0–52.0)
HCT: 40.5 % (ref 39.0–52.0)
Hemoglobin: 11.8 g/dL — ABNORMAL LOW (ref 13.0–17.0)
Hemoglobin: 12.8 g/dL — ABNORMAL LOW (ref 13.0–17.0)
MCH: 28.7 pg (ref 26.0–34.0)
MCH: 27.8 pg (ref 26.0–34.0)
MCHC: 30.1 g/dL (ref 30.0–36.0)
MCHC: 31.6 g/dL (ref 30.0–36.0)
MCV: 95.4 fL (ref 80.0–100.0)
MCV: 88 fL (ref 80.0–100.0)
Platelets: 269 K/uL (ref 150–400)
Platelets: 311 K/uL (ref 150–400)
RBC: 4.11 MIL/uL — ABNORMAL LOW (ref 4.22–5.81)
RBC: 4.6 MIL/uL (ref 4.22–5.81)
RDW: 19.3 % — ABNORMAL HIGH (ref 11.5–15.5)
RDW: 18.8 % — ABNORMAL HIGH (ref 11.5–15.5)
WBC: 13.2 K/uL — ABNORMAL HIGH (ref 4.0–10.5)
WBC: 16.9 K/uL — ABNORMAL HIGH (ref 4.0–10.5)
nRBC: 0 % (ref 0.0–0.2)
nRBC: 0 % (ref 0.0–0.2)

## 2024-02-18 LAB — HEPARIN LEVEL (UNFRACTIONATED)
Heparin Unfractionated: 0.16 [IU]/mL — ABNORMAL LOW (ref 0.30–0.70)
Heparin Unfractionated: 0.35 [IU]/mL (ref 0.30–0.70)
Heparin Unfractionated: 0.34 [IU]/mL (ref 0.30–0.70)

## 2024-02-18 LAB — MAGNESIUM
Magnesium: 2.4 mg/dL (ref 1.7–2.4)
Magnesium: 2.4 mg/dL (ref 1.7–2.4)

## 2024-02-18 MED ORDER — TRACE MINERALS CU-MN-SE-ZN 300-55-60-3000 MCG/ML IV SOLN
INTRAVENOUS | Status: AC
  Filled 2024-02-18: qty 772.8

## 2024-02-18 NOTE — Plan of Care (Signed)

## 2024-02-18 NOTE — Progress Notes (Signed)
 PROGRESS NOTE  Billy [name] FMW:[id number] DOB: [birth year]   PCP: Seabron Lenis, MD  Patient is from: Home.  DOA: 01/21/2024 LOS: 28  Chief complaints Chief Complaint  Patient presents with   Abdominal Pain   Emesis     Brief Narrative / Interim history: 73 year old M with PMH of cardiac arrest earlier this year along with HFrEF, Afib, PE presented with several days of nausea and vomiting from a stomach bug followed by sudden onset severe tearing epigastric pain radiating to back and chest. Workup revealed esophageal tear/Boerhaave.  Patient had esophageal stent placed on 10/7.  Subsequent swallow study revealed an additional leak, and he had stent repositioned on 01/31/2024.  He became hypoxic with gastric contents noted on intubation, and he was transferred to ICU for mechanical ventilation.  Bedside ultrasound with right pleural effusion for which he had chest tube placed with removal of 300 cc exudative fluid.  He was also successfully extubated on 10/16.  Patient has had increased oxygen  requirement requiring reintubation on 10/20.  Bedside ultrasound showed left-sided pleural effusion and he had left-sided chest tube with 120 cc exudative output. ICU stay complicated by high O2 requirement, left sided pleural effusion, chest tube placement 10/16, reintubated 10/20.  Eventually, patient was extubated and transferred out of ICU on 10/28.  Hospital course complicated by acute on chronic HFpEF for which she is on IV Lasix  per advanced heart failure team.  Subjective: Seen and examined earlier this morning.  No major events overnight or this morning.  Sitting on bedside chair.  Frustrated about diarrhea.  He reports small frequent squirts of stool.    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 on TPN for  nutrition.  Some discussion about PEG tube -Bilateral chest tube in place.  Cardiothoracic following.  Plan for EGD on 11/3 -Now on IV Zosyn  per CVTS.    Acute hypoxic/ hypercapnic respiratory failure-multifactorial including aspiration pneumonia, pleural effusion and CHF.  Trach aspirate on 10/15 with Candida albicans. -IV meropenem>> IV Zosyn  for aspiration pneumonia. -On Diflucan for Candida albicans from trach aspirate 10/15>> -Chest tube for pleural effusion per CTS. -IV Lasix  per advanced heart failure team -Continue bronchodilators   Acute on chronic diastolic CHF-Limited TTE on 10/23 with LVEF of 60 to 65%.  RHC in 3/25 with mild PAH with severe RV failure but preserved CO.  Appears euvolemic on exam. -On IV Lasix  120 mg daily per advanced heart failure team while on TPN. 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  AKI seems to have resolved. -Continue monitoring   Paroxysmal A-fib: s/p TEE DCCV 3/25.  Seems to be in sinus rhythm. -Amiodarone  drip per cardiology -On heparin  for anticoagulation -Optimize electrolyte   Intractable pain-reports a little pain in his left chest at chest tube site but rates his pain 11/10.  Required ketamine while in ICU.  Currently on IV Dilaudid  PCA. -Continue Dilaudid  PCA.  H/o PE/ DVT- s/p infrarenal IVC placement 3/25. V/Q 3/25 with multiple perfusion defects - Continue IV heparin .  Essential hypertension: SBP in 150s. -Pain control as above -Diuretics as above. -IV hydralazine  as needed  Unwitnessed fall in the hospital: Found down on the floor the evening of 10/31.  No apparent injury or focal neurodeficit. - Fall precaution  Anxiety disorder: Stable. -Ativan  as needed   Acute on chronic anemia -  Monitor H&H.   Leukocytosis: Slightly worse today. - Antibiotics as above.  GERD - IV PPI.  Goal of care: Noted DNR paperwork in [facility] but patient confirms full CODE  STATUS.SABRA   Severe protein calorie malnutrition/hypoalbuminemia Body mass index is 20.01 kg/m. Nutrition Problem: Severe Malnutrition Etiology: acute illness (may have chronic component due to time frame of reported wt loss) Signs/Symptoms: severe fat depletion, moderate muscle depletion Interventions: Refer to RD note for recommendations  Pressure skin injury Wound 02/07/24 0900 Pressure Injury Foot Anterior;Right Stage 1 -  Intact skin with non-blanchable redness of a localized area usually over a bony prominence. (Active)     Wound 02/07/24 0900 Pressure Injury Foot Anterior;Left Stage 1 -  Intact skin with non-blanchable redness of a localized area usually over a bony prominence. (Active)     Wound 02/07/24 0900 Pressure Injury Buttocks Right Stage 1 -  Intact skin with non-blanchable redness of a localized area usually over a bony prominence. (Active)   DVT prophylaxis:  SCDs Start: 01/21/24 2023  Code Status: Full code Family Communication: None at bedside today. Level of care: Telemetry Cardiac Status is: Inpatient Remains inpatient appropriate because: Esophageal rupture, acute CHF,   Final disposition: LTAC?   35 minutes with more than 50% spent in reviewing records, counseling patient/family and coordinating care.  Consultants:  Gastroenterology Critical care Cardiology Cardiothoracic surgery  Procedures: See above.  Microbiology summarized: 10/5-MRSA PCR screen positive 10/15-trach aspirate culture with Candida albicans.  Objective: Vitals:   02/17/24 2348 02/18/24 0445 02/18/24 0850 02/18/24 0913  BP:   (!) 164/63   Pulse:   78   Resp: 15 16 19  (!) 22  Temp:   97.7 F (36.5 C)   TempSrc:   Oral   SpO2:   91%   Weight:      Height:        Examination:  GENERAL: No apparent distress.  Nontoxic. HEENT: MMM.  Vision and hearing grossly intact.  NECK: Supple.  No apparent JVD.  RESP:  No IWOB.  Fair aeration bilaterally.  Chest tube to left  chest. CVS:  RRR. Heart sounds normal.  ABD/GI/GU: BS+. Abd soft, NTND.  MSK/EXT:  Moves extremities. No apparent deformity. No edema.  SKIN: no apparent skin lesion or wound NEURO: AA.  Oriented appropriately.  No apparent focal neuro deficit. PSYCH: Calm. Normal affect.   Sch Meds:  Scheduled Meds:  bisacodyl   10 mg Rectal Q0600   Chlorhexidine  Gluconate Cloth  6 each Topical Daily   [name] butt cream   Topical BID   HYDROmorphone    Intravenous Q4H   lidocaine   1 patch Transdermal Q24H   pantoprazole  (PROTONIX ) IV  40 mg Intravenous Q12H   sodium chloride  flush  10 mL Intrapleural Q8H   sodium chloride  flush  10-40 mL Intracatheter Q12H   Continuous Infusions:  amiodarone  30 mg/hr (02/18/24 1002)   fluconazole (DIFLUCAN) IV 200 mg (02/18/24 1000)   furosemide  120 mg (02/18/24 0956)   heparin  1,950 Units/hr (02/17/24 2205)   piperacillin -tazobactam (ZOSYN )  IV 3.375 g (02/18/24 0444)   TPN ADULT (ION) 70 mL/hr at 02/17/24 1814   TPN ADULT (ION)     PRN Meds:.diclofenac Sodium, diphenhydrAMINE **OR** diphenhydrAMINE, hydrALAZINE , levalbuterol, LORazepam , naloxone  **AND** sodium chloride  flush, ondansetron  (ZOFRAN ) IV, mouth rinse, sodium chloride  flush  Antimicrobials: Anti-infectives (From admission, onward)    Start     Dose/Rate Route Frequency Ordered Stop   02/13/24 0900  piperacillin -tazobactam (ZOSYN ) IVPB 3.375 g  3.375 g 12.5 mL/hr over 240 Minutes Intravenous Every 8 hours 02/13/24 0812     02/07/24 0800  vancomycin (VANCOCIN) IVPB 1000 mg/200 mL premix  Status:  Discontinued        1,000 mg 200 mL/hr over 60 Minutes Intravenous Every 24 hours 02/07/24 0632 02/09/24 0905   02/06/24 2200  meropenem (MERREM) 1 g in sodium chloride  0.9 % 100 mL IVPB  Status:  Discontinued        1 g 200 mL/hr over 30 Minutes Intravenous Every 12 hours 02/06/24 1127 02/13/24 0812   02/06/24 1300  vancomycin (VANCOREADY) IVPB 1750 mg/350 mL  Status:  Discontinued         1,750 mg 175 mL/hr over 120 Minutes Intravenous Every 48 hours 02/04/24 1225 02/04/24 1226   02/06/24 0800  vancomycin (VANCOCIN) IVPB 1000 mg/200 mL premix        1,000 mg 200 mL/hr over 60 Minutes Intravenous  Once 02/06/24 0629 02/07/24 0829   02/04/24 1400  meropenem (MERREM) 1 g in sodium chloride  0.9 % 100 mL IVPB  Status:  Discontinued        1 g 200 mL/hr over 30 Minutes Intravenous Every 8 hours 02/04/24 1208 02/06/24 1127   02/04/24 1300  linezolid  (ZYVOX ) IVPB 600 mg  Status:  Discontinued        600 mg 300 mL/hr over 60 Minutes Intravenous Every 12 hours 02/04/24 1202 02/04/24 1208   02/04/24 1300  vancomycin (VANCOREADY) IVPB 2000 mg/400 mL        2,000 mg 200 mL/hr over 120 Minutes Intravenous  Once 02/04/24 1208 02/04/24 1623   02/04/24 1208  vancomycin variable dose per unstable renal function (pharmacist dosing)  Status:  Discontinued         Does not apply See admin instructions 02/04/24 1208 02/07/24 0632   01/31/24 1030  fluconazole (DIFLUCAN) IVPB 200 mg        200 mg 100 mL/hr over 60 Minutes Intravenous Daily 01/31/24 0938     01/30/24 1445  piperacillin -tazobactam (ZOSYN ) IVPB 3.375 g  Status:  Discontinued        3.375 g 12.5 mL/hr over 240 Minutes Intravenous Every 8 hours 01/30/24 1354 02/04/24 1203   01/26/24 1000  amoxicillin-clavulanate (AUGMENTIN) 400-57 MG/5ML suspension 875 mg  Status:  Discontinued        875 mg Oral Every 12 hours 01/26/24 0658 01/30/24 1354   01/26/24 1000  fluconazole (DIFLUCAN) 40 MG/ML suspension 200 mg  Status:  Discontinued        200 mg Oral Daily 01/26/24 0658 01/31/24 0938   01/22/24 1445  fluconazole (DIFLUCAN) IVPB 400 mg  Status:  Discontinued        400 mg 100 mL/hr over 120 Minutes Intravenous Every 24 hours 01/22/24 1436 01/22/24 1439   01/22/24 1445  fluconazole (DIFLUCAN) IVPB 200 mg  Status:  Discontinued        200 mg 100 mL/hr over 60 Minutes Intravenous Every 24 hours 01/22/24 1439 01/26/24 0658   01/21/24 2200   piperacillin -tazobactam (ZOSYN ) IVPB 3.375 g  Status:  Discontinued       Placed in Followed by Linked Group   3.375 g 12.5 mL/hr over 240 Minutes Intravenous Every 8 hours 01/21/24 1607 01/26/24 0658   01/21/24 1615  piperacillin -tazobactam (ZOSYN ) IVPB 3.375 g       Placed in Followed by Linked Group   3.375 g 100 mL/hr over 30 Minutes Intravenous  Once 01/21/24 1607 01/21/24 1651  I have personally reviewed the following labs and images: CBC: Recent Labs  Lab 02/15/24 0430 02/16/24 0440 02/17/24 0400 02/18/24 0320 02/18/24 0521  WBC 14.8* 12.3* 12.2* 13.2* 16.9*  HGB 10.7* 10.8* 11.3* 11.8* 12.8*  HCT 34.0* 34.7* 35.9* 39.2 40.5  MCV 94.4 88.3 89.1 95.4 88.0  PLT 291 289 281 269 311   BMP &GFR Recent Labs  Lab 02/15/24 0601 02/16/24 0440 02/17/24 0400 02/18/24 0320 02/18/24 0521  NA 145 143 143 132* 139  K 3.9 3.7 4.1 6.7* 4.2  CL 102 104 104 98 102  CO2 27 27 27 22 23   GLUCOSE 146* 109* 131* 767* 111*  BUN 74* 73* 73* 69* 74*  CREATININE 1.86* 2.00* 2.06* 1.91* 2.18*  CALCIUM  9.6 9.4 9.7 8.3* 9.5  MG 2.0 2.0 1.9 2.4 2.4  PHOS 4.1 4.3 4.1 6.0* 4.1   Estimated Creatinine Clearance: 30.2 mL/min (A) (by C-G formula based on SCr of 2.18 mg/dL (H)). Liver & Pancreas: Recent Labs  Lab 02/12/24 0432 02/13/24 0637 02/15/24 0601 02/16/24 0440 02/17/24 0400 02/18/24 0320 02/18/24 0521  AST 17  --  15  --   --   --   --   ALT 20  --  19  --   --   --   --   ALKPHOS 95  --  95  --   --   --   --   BILITOT 0.5  --  0.5  --   --   --   --   PROT 6.4*  --  7.1  --   --   --   --   ALBUMIN  1.5*  1.5*   < > 1.8* 1.8* 2.0* 1.8* 2.1*   < > = values in this interval not displayed.   No results for input(s): LIPASE, AMYLASE in the last 168 hours. No results for input(s): AMMONIA in the last 168 hours. Diabetic: No results for input(s): HGBA1C in the last 72 hours. Recent Labs  Lab 02/11/24 2312 02/12/24 0442 02/12/24 0725 02/12/24 1120  02/12/24 1601  GLUCAP 145* 130* 131* 130* 137*   Cardiac Enzymes: No results for input(s): CKTOTAL, CKMB, CKMBINDEX, TROPONINI in the last 168 hours. No results for input(s): PROBNP in the last 8760 hours. Coagulation Profile: No results for input(s): INR, PROTIME in the last 168 hours. Thyroid  Function Tests: No results for input(s): TSH, T4TOTAL, FREET4, T3FREE, THYROIDAB in the last 72 hours. Lipid Profile: No results for input(s): CHOL, HDL, LDLCALC, TRIG, CHOLHDL, LDLDIRECT in the last 72 hours.  Anemia Panel: No results for input(s): VITAMINB12, FOLATE, FERRITIN, TIBC, IRON , RETICCTPCT in the last 72 hours. Urine analysis:    Component Value Date/Time   COLORURINE YELLOW 07/04/2023 1141   APPEARANCEUR HAZY (A) 07/04/2023 1141   LABSPEC 1.010 07/04/2023 1141   PHURINE 5.0 07/04/2023 1141   GLUCOSEU NEGATIVE 07/04/2023 1141   HGBUR NEGATIVE 07/04/2023 1141   BILIRUBINUR NEGATIVE 07/04/2023 1141   KETONESUR NEGATIVE 07/04/2023 1141   PROTEINUR NEGATIVE 07/04/2023 1141   UROBILINOGEN 0.2 08/31/2019 1136   NITRITE NEGATIVE 07/04/2023 1141   LEUKOCYTESUR NEGATIVE 07/04/2023 1141   Sepsis Labs: Invalid input(s): PROCALCITONIN, LACTICIDVEN  Microbiology: No results found for this or any previous visit (from the past 240 hours).   Radiology Studies: No results found.    Marbella Markgraf T. Aziel Morgan [facility]  If 7PM-7AM, please contact night-coverage [email or web address] 02/18/2024, 10:33 AM

## 2024-02-18 NOTE — Progress Notes (Signed)
 PHARMACY - TOTAL PARENTERAL NUTRITION CONSULT NOTE   Indication: intolerance to enteral feedings, esophageal perforation  Patient Measurements: Height: 6' 1 (185.4 cm) Weight:  (Pt refused to stand from sitting in the chair) IBW/kg (Calculated) : 79.9 TPN AdjBW (KG): 80.6 Body mass index is 20.01 kg/m.  Assessment:  73 year old man with medical history significant for cardiac arrest earlier this year who was admitted after several days of nausea and vomiting followed by sudden onset severe tearing epigastric pain radiating to back and chest. 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 Pharmacy consulted to initiate TPN due to inability to tolerate enteral nutrition at this time.  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 PTA takes torsemide  20 mg PO daily, now held. Aggressive fluid resuscitation recommended per GI. Per HF, patient currently euvolemic but will hold diuretic for now. Due to CHF will target lower end of fluid range for TPN and supplement additional fluids per MD outside of TPN to prevent fluid overload.   Stent placed on 10/6, esophogram not showing any leak. Diet advanced to CLD. 10/15 Leak present and made NPO. 10/24 esophagram with persistent leak; will need stent replacement in 3-4 weeks.   10/28 AM update: patient has weights that are significantly more than admission weight of 72.7 kg on 10/5. This may represent false weights from bed scale.  Current weight is 71.6 kg  11/2 initial labs spurious  Glucose / Insulin : A1C 6.0% (02/2023);  BG <140, 13 units insulin  in TPN  Electrolytes: Na 139, K 4.2, Cl 102, CO2 23, Ca 9.5 [CoCa 11], Phos 4.1, Mg 2.4 Renal: SCr 2.18 (BL ~2), BUN 73 Hepatic: AST/ALT/tbili/alk phos wnl, alb 2, TG 41 Intake / Output; MIVF: furosemide  down 120 mg IV daily starting 11/1; UOP 1.5 mL/kg/hr, chest tube 0 ml, LBM 11/1 Net IO Since Admission: 2,359 mL [02/18/24 0655]  GI Imaging: 10/05 CT:  concerning for esophageal perforation, masslike area of soft tissue thickening adjacent to and surrounding the esophagus  10/06 Esoph XR: distal esophageal perforation 10/7 Esoph XR: No leak associated with stent coverage of esophageal perforation 10/14 KUB: Gas-filled loops of small bowel and colon suggestive of ileus 10/15 Esoph XR: esophageal perforation/leak at the distal esophagus, proximal to the esophageal stent 10/24 esophagram: persistent leak despite being covered by the esophageal stent.  GI Surgeries / Procedures:  10/6: esophageal stent placement  10/15 EGD and stent repositioning   Central access: 01/22/24 TPN start date: 01/22/24  Nutritional Goals:  Goal concentrated TPN is 70 ml/hr (provides 116 g of protein and 2301 kcals per day  RD Assessment:  Estimated Needs Total Energy Estimated Needs: 2300-2500 kcals Total Protein Estimated Needs: 115-130 g Total Fluid Estimated Needs: 2L  Current Nutrition:  TPN  10/13- 10/14 DYS1: not taking any po  10/15: NPO d/t increased abd distension  10/22 Propofol  at 7.6- 20.2 ml/hr, providing ~370 kcals/24hr- OFF 10/23: switch propofol  back to dexmedetomidine - OFF  Plan:  Continue concentrated TPN at goal 70 mL/hr, providing 100% estimated nutritional needs   Electrolytes in TPN: increase Na 15 mEq/L, K 60 mEq/L, Ca 0 mEq/L, Mg 5 mEq/L, Phos 10 mmol/L. Cl:Ac max acetate (cannot do 1:1 or 1:2 due to no Na in bag ) Continue standard MVI and trace elements to TPN Continue 13 units insulin  regular to TPN Monitor TPN labs on Mon/Thurs, and PRN   Thank you for involving pharmacy in this patient's care.  Delon Sax, PharmD, BCPS Clinical Pharmacist Clinical  phone for 02/18/2024 is [phone] 02/18/2024 6:55 AM

## 2024-02-18 NOTE — Progress Notes (Signed)
 PHARMACY - ANTICOAGULATION CONSULT NOTE  Pharmacy Consult for heparin  infusion Indication: atrial fibrillation  Allergies  Allergen Reactions   Norvasc  [Amlodipine ] Swelling and Other (See Comments)    Excessive BLE swelling Skin discoloration, blotching   Jardiance  [Empagliflozin ] Itching and Other (See Comments)    Patient mentioned penile swelling, pain   Morphine And Codeine Itching    Opioid-induced pruritus    Patient Measurements: Height: 6' 1 (185.4 cm) Weight:  (Pt refused to stand from sitting in the chair) IBW/kg (Calculated) : 79.9 HEPARIN  DW (KG): 80.6  Vital Signs: Temp: 97.7 F (36.5 C) (11/02 0850) Temp Source: Oral (11/02 0850) BP: 164/63 (11/02 0850) Pulse Rate: 78 (11/02 0850)  Labs: Recent Labs    02/17/24 0400 02/17/24 0800 02/18/24 0320 02/18/24 0521 02/18/24 0551 02/18/24 0924  HGB 11.3*  --  11.8* 12.8*  --   --   HCT 35.9*  --  39.2 40.5  --   --   PLT 281  --  269 311  --   --   HEPARINUNFRC 0.63   < > 0.16*  --  0.35 0.34  CREATININE 2.06*  --  1.91* 2.18*  --   --    < > = values in this interval not displayed.    Estimated Creatinine Clearance: 30.2 mL/min (A) (by C-G formula based on SCr of 2.18 mg/dL (H)).   Assessment: 73 yo M presenting for esophageal rupture, now s/p esophageal stent placement on 10/6. PMH significant for HFrEF, Afib on Eliquis  PTA. Last dose of Eliquis  01/20/24, time unknown. Pharmacy consulted for heparin  management.  10/15 - underwent EGD with stent reposition on 10/15 after finding esophageal leak.   -Heparin  level 0.34 with heparin  running at 1950 units/hr.    Goal of Therapy:  Heparin  level 0.3-0.5 units/ml Monitor platelets by anticoagulation protocol: Yes   Plan:  Continue IV heparin  at 1950 units/hr Monitor heparin  level daily Monitor CBC and s/sx of bleeding daily  Prentice Poisson, PharmD Clinical Pharmacist **Pharmacist phone directory can now be found on [email or web address] ([name]).  Listed under  [facility].

## 2024-02-18 NOTE — Progress Notes (Signed)
 Lab result of 0320 on 02/18/24 results could be skewed, this RN forgot to hold TPN drip prior to drawing labs. Lab and MD notified.  Labs re-drawn at 0521, and resulted.

## 2024-02-18 NOTE — Plan of Care (Signed)
   Problem: Education: Goal: Knowledge of General Education information will improve Description Including pain rating scale, medication(s)/side effects and non-pharmacologic comfort measures Outcome: Progressing

## 2024-02-19 ENCOUNTER — Encounter (HOSPITAL_COMMUNITY)
Admission: EM | Disposition: A | Discharge status: Skilled Nursing Facility | Payer: Self-pay | Source: Home / Self Care | Attending: Family Medicine

## 2024-02-19 ENCOUNTER — Inpatient Hospital Stay (HOSPITAL_COMMUNITY): Admitting: Anesthesiology

## 2024-02-19 ENCOUNTER — Encounter (HOSPITAL_COMMUNITY): Payer: Self-pay | Admitting: Internal Medicine

## 2024-02-19 DIAGNOSIS — I5023 Acute on chronic systolic (congestive) heart failure: Secondary | ICD-10-CM | POA: Diagnosis not present

## 2024-02-19 DIAGNOSIS — J90 Pleural effusion, not elsewhere classified: Secondary | ICD-10-CM | POA: Diagnosis not present

## 2024-02-19 DIAGNOSIS — I130 Hypertensive heart and chronic kidney disease with heart failure and stage 1 through stage 4 chronic kidney disease, or unspecified chronic kidney disease: Secondary | ICD-10-CM | POA: Diagnosis not present

## 2024-02-19 DIAGNOSIS — I480 Paroxysmal atrial fibrillation: Secondary | ICD-10-CM | POA: Diagnosis not present

## 2024-02-19 DIAGNOSIS — K223 Perforation of esophagus: Secondary | ICD-10-CM

## 2024-02-19 DIAGNOSIS — E46 Unspecified protein-calorie malnutrition: Secondary | ICD-10-CM | POA: Diagnosis not present

## 2024-02-19 DIAGNOSIS — I5033 Acute on chronic diastolic (congestive) heart failure: Secondary | ICD-10-CM

## 2024-02-19 DIAGNOSIS — N1832 Chronic kidney disease, stage 3b: Secondary | ICD-10-CM

## 2024-02-19 DIAGNOSIS — E43 Unspecified severe protein-calorie malnutrition: Secondary | ICD-10-CM | POA: Diagnosis not present

## 2024-02-19 HISTORY — PX: ESOPHAGOGASTRODUODENOSCOPY: SHX5428

## 2024-02-19 HISTORY — PX: PEG PLACEMENT: SHX5437

## 2024-02-19 LAB — HEPARIN LEVEL (UNFRACTIONATED): Heparin Unfractionated: 0.88 [IU]/mL — ABNORMAL HIGH (ref 0.30–0.70)

## 2024-02-19 LAB — COMPREHENSIVE METABOLIC PANEL WITH GFR
ALT: 15 U/L (ref 0–44)
AST: 12 U/L — ABNORMAL LOW (ref 15–41)
Albumin: 1.9 g/dL — ABNORMAL LOW (ref 3.5–5.0)
Alkaline Phosphatase: 72 U/L (ref 38–126)
Anion gap: 12 (ref 5–15)
BUN: 80 mg/dL — ABNORMAL HIGH (ref 8–23)
CO2: 26 mmol/L (ref 22–32)
Calcium: 9.5 mg/dL (ref 8.9–10.3)
Chloride: 103 mmol/L (ref 98–111)
Creatinine, Ser: 2.2 mg/dL — ABNORMAL HIGH (ref 0.61–1.24)
GFR, Estimated: 31 mL/min — ABNORMAL LOW (ref >60)
Glucose, Bld: 135 mg/dL — ABNORMAL HIGH (ref 70–99)
Potassium: 4.2 mmol/L (ref 3.5–5.1)
Sodium: 141 mmol/L (ref 135–145)
Total Bilirubin: 0.5 mg/dL (ref 0.0–1.2)
Total Protein: 7.1 g/dL (ref 6.5–8.1)

## 2024-02-19 LAB — CBC
HCT: 36.4 % — ABNORMAL LOW (ref 39.0–52.0)
Hemoglobin: 11.5 g/dL — ABNORMAL LOW (ref 13.0–17.0)
MCH: 27.6 pg (ref 26.0–34.0)
MCHC: 31.6 g/dL (ref 30.0–36.0)
MCV: 87.5 fL (ref 80.0–100.0)
Platelets: 242 K/uL (ref 150–400)
RBC: 4.16 MIL/uL — ABNORMAL LOW (ref 4.22–5.81)
RDW: 19 % — ABNORMAL HIGH (ref 11.5–15.5)
WBC: 8.9 K/uL (ref 4.0–10.5)
nRBC: 0 % (ref 0.0–0.2)

## 2024-02-19 LAB — MAGNESIUM: Magnesium: 2.5 mg/dL — ABNORMAL HIGH (ref 1.7–2.4)

## 2024-02-19 LAB — PHOSPHORUS: Phosphorus: 4 mg/dL (ref 2.5–4.6)

## 2024-02-19 LAB — TRIGLYCERIDES: Triglycerides: 56 mg/dL (ref <150)

## 2024-02-19 MED ORDER — ONDANSETRON HCL 4 MG/2ML IJ SOLN
INTRAMUSCULAR | Status: DC | PRN
  Administered 2024-02-19: 4 mg via INTRAVENOUS

## 2024-02-19 MED ORDER — PROPOFOL 10 MG/ML IV BOLUS
INTRAVENOUS | Status: AC
  Filled 2024-02-19: qty 20

## 2024-02-19 MED ORDER — LIDOCAINE 2% (20 MG/ML) 5 ML SYRINGE
INTRAMUSCULAR | Status: AC
  Filled 2024-02-19: qty 5

## 2024-02-19 MED ORDER — TRACE MINERALS CU-MN-SE-ZN 300-55-60-3000 MCG/ML IV SOLN
INTRAVENOUS | Status: AC
  Filled 2024-02-19: qty 772.8

## 2024-02-19 MED ORDER — FENTANYL CITRATE (PF) 100 MCG/2ML IJ SOLN
INTRAMUSCULAR | Status: AC
  Filled 2024-02-19: qty 2

## 2024-02-19 MED ORDER — AMISULPRIDE (ANTIEMETIC) 5 MG/2ML IV SOLN: 10.0000 mg | Freq: Once | INTRAVENOUS | Status: DC | PRN

## 2024-02-19 MED ORDER — ONDANSETRON HCL 4 MG/2ML IJ SOLN
INTRAMUSCULAR | Status: AC
  Filled 2024-02-19: qty 2

## 2024-02-19 MED ORDER — EPHEDRINE SULFATE-NACL 50-0.9 MG/10ML-% IV SOSY
PREFILLED_SYRINGE | INTRAVENOUS | Status: DC | PRN
  Administered 2024-02-19: 5 mg via INTRAVENOUS
  Administered 2024-02-19 (×2): 10 mg via INTRAVENOUS

## 2024-02-19 MED ORDER — LIDOCAINE 2% (20 MG/ML) 5 ML SYRINGE
INTRAMUSCULAR | Status: DC | PRN
  Administered 2024-02-19: 60 mg via INTRAVENOUS

## 2024-02-19 MED ORDER — MIDAZOLAM HCL 2 MG/2ML IJ SOLN
INTRAMUSCULAR | Status: AC
  Filled 2024-02-19: qty 2

## 2024-02-19 MED ORDER — ACETAMINOPHEN 10 MG/ML IV SOLN: 1000.0000 mg | Freq: Once | INTRAVENOUS | Status: DC | PRN

## 2024-02-19 MED ORDER — PROPOFOL 10 MG/ML IV BOLUS
INTRAVENOUS | Status: DC | PRN
  Administered 2024-02-19: 100 mg via INTRAVENOUS

## 2024-02-19 MED ORDER — PHENYLEPHRINE 80 MCG/ML (10ML) SYRINGE FOR IV PUSH (FOR BLOOD PRESSURE SUPPORT)
PREFILLED_SYRINGE | INTRAVENOUS | Status: AC
  Filled 2024-02-19: qty 10

## 2024-02-19 MED ORDER — ONDANSETRON HCL 4 MG/2ML IJ SOLN: 4.0000 mg | Freq: Once | INTRAMUSCULAR | Status: DC | PRN

## 2024-02-19 MED ORDER — SUCCINYLCHOLINE CHLORIDE 200 MG/10ML IV SOSY
PREFILLED_SYRINGE | INTRAVENOUS | Status: AC
  Filled 2024-02-19: qty 10

## 2024-02-19 MED ORDER — FENTANYL CITRATE (PF) 100 MCG/2ML IJ SOLN
25.0000 ug | INTRAMUSCULAR | Status: DC | PRN
  Administered 2024-02-19: 25 ug via INTRAVENOUS

## 2024-02-19 MED ORDER — DEXAMETHASONE SOD PHOSPHATE PF 10 MG/ML IJ SOLN
INTRAMUSCULAR | Status: DC | PRN
  Administered 2024-02-19: 10 mg via INTRAVENOUS

## 2024-02-19 MED ORDER — SODIUM CHLORIDE (PF) 0.9 % IJ SOLN
INTRAMUSCULAR | Status: AC
  Administered 2024-02-19: 10 mL
  Filled 2024-02-19: qty 10

## 2024-02-19 MED ORDER — SODIUM CHLORIDE 0.9 % IV SOLN: INTRAVENOUS | Status: DC

## 2024-02-19 MED ORDER — 0.9 % SODIUM CHLORIDE (POUR BTL) OPTIME
TOPICAL | Status: DC | PRN
Start: 2024-02-19 — End: 2024-02-19
  Administered 2024-02-19: 1000 mL

## 2024-02-19 MED ORDER — EPHEDRINE 5 MG/ML INJ
INTRAVENOUS | Status: AC
  Filled 2024-02-19: qty 5

## 2024-02-19 MED ORDER — SUCCINYLCHOLINE CHLORIDE 200 MG/10ML IV SOSY
PREFILLED_SYRINGE | INTRAVENOUS | Status: DC | PRN
  Administered 2024-02-19: 80 mg via INTRAVENOUS

## 2024-02-19 MED ORDER — CHLORHEXIDINE GLUCONATE 0.12 % MT SOLN: 15.0000 mL | Freq: Once | OROMUCOSAL | Status: AC

## 2024-02-19 MED ORDER — CHLORHEXIDINE GLUCONATE 0.12 % MT SOLN
OROMUCOSAL | Status: AC
  Administered 2024-02-19: 15 mL via OROMUCOSAL
  Filled 2024-02-19: qty 15

## 2024-02-19 MED ORDER — MIDAZOLAM HCL (PF) 2 MG/2ML IJ SOLN
INTRAMUSCULAR | Status: DC | PRN
  Administered 2024-02-19: 2 mg via INTRAVENOUS

## 2024-02-19 MED ORDER — CARMEX CLASSIC LIP BALM EX OINT
TOPICAL_OINTMENT | CUTANEOUS | Status: DC | PRN
  Filled 2024-02-19 (×2): qty 10

## 2024-02-19 MED ORDER — ORAL CARE MOUTH RINSE: 15.0000 mL | Freq: Once | OROMUCOSAL | Status: AC

## 2024-02-19 MED ORDER — FENTANYL CITRATE (PF) 100 MCG/2ML IJ SOLN
INTRAMUSCULAR | Status: DC | PRN
Start: 2024-02-19 — End: 2024-02-19
  Administered 2024-02-19: 100 ug via INTRAVENOUS

## 2024-02-19 NOTE — Progress Notes (Signed)
 Physical Therapy Treatment Patient Details Name: Billy [name] MRN: [id number] DOB: [birth year] Today's Date: 02/19/2024   History of Present Illness Billy [name] is a 73 y.o. male who presented to [facility] ED 01/21/24 with several days of vomiting followed by an episode of severe tearing epigastric pain. Work-up revealed esophageal tear. Pt s/p esophageal stent with EGD 10/7. Return to OR for esophageal stent adjustment on 10/15, remained intubated after procedure due to suspected aspiration. Extubated 10/16.  Pt had CT placed 10/16. Reintubated 10/20-10/24. G tube placement planned for 11/3. PMHx: HFrEF, AFib, CAD, PE complicated by cardiogenic shock, CKD 4, MI, peripheral neuropathy, and TIA.    PT Comments  Tolerated fairly well, ambulating in room with min assist +2 for walker control and line/lead management. Practiced transfer training with min assist and bed mobility at Min A level. Cues throughout for technique, safety. Patient will continue to benefit from skilled physical therapy services to further improve independence with functional mobility.     If plan is discharge home, recommend the following: Assistance with cooking/housework;Assist for transportation;Help with stairs or ramp for entrance;A lot of help with bathing/dressing/bathroom;Direct supervision/assist for medications management;Direct supervision/assist for financial management;Supervision due to cognitive status;A lot of help with walking and/or transfers   Can travel by private vehicle        Equipment Recommendations  Wheelchair (measurements PT);Wheelchair cushion (measurements PT)    Recommendations for Other Services       Precautions / Restrictions Precautions Precautions: Fall Recall of Precautions/Restrictions: Impaired Precaution/Restrictions Comments: BIL chest tubes in place; 2 IV towers Restrictions Weight Bearing Restrictions Per Provider Order: No     Mobility  Bed Mobility Overal bed mobility: Needs  Assistance Bed Mobility: Sit to Supine       Sit to supine: Min assist   General bed mobility comments: Min assist for LE support back into bed. Cues for awareness of lines/leads and hand placement to avoid pulling items.    Transfers Overall transfer level: Needs assistance Equipment used: Rolling walker (2 wheels) Transfers: Sit to/from Stand Sit to Stand: Min assist           General transfer comment: Min assist for boost and balance to stand and pivot to [facility] from recliner. Pt anxious. Min assist to rise from [facility] with +2 support for managing multiple lines/leads.    Ambulation/Gait Ambulation/Gait assistance: Min assist Gait Distance (Feet): 20 Feet Assistive device: Rolling walker (2 wheels) Gait Pattern/deviations: Step-through pattern, Decreased stride length, Trunk flexed, Shuffle, Leaning posteriorly Gait velocity: decreased Gait velocity interpretation: <1.31 ft/sec, indicative of household ambulator   General Gait Details: Min assist for balance and RW control. Cues for upright posture and foot clearance. No buckling noted with this distance covered. Feels fairly fatigued by end of distance. Stood for >4 minutes between peri-care at [facility] and gentle thoracic extension AROM with BIL UE support on RW.   Stairs             Wheelchair Mobility     Tilt Bed    Modified Rankin (Stroke Patients Only)       Balance Overall balance assessment: Needs assistance Sitting-balance support: Feet supported, Bilateral upper extremity supported Sitting balance-Leahy Scale: Fair     Standing balance support: During functional activity, Bilateral upper extremity supported, Reliant on assistive device for balance Standing balance-Leahy Scale: Poor Standing balance comment: reliant on external support  Communication Communication Communication: Impaired Factors Affecting Communication: Difficulty expressing self  Cognition  Arousal: Alert Behavior During Therapy: Anxious, Agitated   PT - Cognitive impairments: Attention, Sequencing, Problem solving, Awareness, Safety/Judgement                         Following commands: Impaired Following commands impaired: Follows one step commands with increased time    Cueing Cueing Techniques: Verbal cues, Gestural cues, Tactile cues  Exercises      General Comments General comments (skin integrity, edema, etc.): VSS SpO2 low to mid 90s on RA.      Pertinent Vitals/Pain Pain Assessment Pain Assessment: Faces Faces Pain Scale: Hurts a little bit Pain Location: CT sites Pain Descriptors / Indicators: Discomfort Pain Intervention(s): Limited activity within patient's tolerance, Monitored during session, Repositioned    Home Living Family/patient expects to be discharged to:: Private residence Living Arrangements: Alone Available Help at Discharge: Family;Available PRN/intermittently Type of Home: House Home Access: Ramped entrance;Stairs to enter Entrance Stairs-Rails: Right;Left Entrance Stairs-Number of Steps: 6 (front porch)   Home Layout: One level Home Equipment: Hand held shower head;Rolling Walker (2 wheels);Cane - single point Additional Comments: ex wife can assist    Prior Function            PT Goals (current goals can now be found in the care plan section) Acute Rehab PT Goals Patient Stated Goal: Return Home PT Goal Formulation: With patient Time For Goal Achievement: 02/16/24 Potential to Achieve Goals: Good Progress towards PT goals: Progressing toward goals    Frequency    Min 2X/week      PT Plan      Co-evaluation              AM-PAC PT 6 Clicks Mobility   Outcome Measure  Help needed turning from your back to your side while in a flat bed without using bedrails?: A Little Help needed moving from lying on your back to sitting on the side of a flat bed without using bedrails?: A Lot Help needed moving  to and from a bed to a chair (including a wheelchair)?: A Lot Help needed standing up from a chair using your arms (e.g., wheelchair or bedside chair)?: A Lot Help needed to walk in hospital room?: A Lot Help needed climbing 3-5 steps with a railing? : Total 6 Click Score: 12    End of Session Equipment Utilized During Treatment: Oxygen  Activity Tolerance: Patient limited by fatigue;Patient tolerated treatment well Patient left: with call bell/phone within reach;in bed;with bed alarm set;with family/visitor present Nurse Communication: Mobility status PT Visit Diagnosis: Difficulty in walking, not elsewhere classified (R26.2);Unsteadiness on feet (R26.81);Other abnormalities of gait and mobility (R26.89);Muscle weakness (generalized) (M62.81);Other symptoms and signs involving the nervous system (R29.898);Pain;History of falling (Z91.81)     Time: 8851-8791 PT Time Calculation (min) (ACUTE ONLY): 20 min  Charges:    $Therapeutic Activity: 8-22 mins PT General Charges $$ ACUTE PT VISIT: 1 Visit                     Leontine Roads, PT, DPT [facility]  Rehabilitation Services Physical Therapist Office: [phone] Website: [email or web address]    [name] 02/19/2024, 1:56 PM

## 2024-02-19 NOTE — Progress Notes (Signed)
 Nutrition Follow-up  DOCUMENTATION CODES:   Severe malnutrition in context of acute illness/injury  INTERVENTION:   Continue TPN to meet 100% estimated nutritional needs until TF can be initiated and titrated up to goal rate w/ patient tolerating -Recommend considering increasing free water  in TPN (un-concentrating) -Continue MVI w/ trace elements added to TPN  Monitor for PEG tube placement and ability to start TF s/p PEG placement  When appropriate, start TF via PEG: -[place] 1.5 Peptide at 60 ml/h (1440 ml per day) Initiate at 20ml/hr and increase by 10ml/hr q12h -Prosource TF20 60ml daily' -Recommend low volume FWF when TF initiated -Add MVI w/ minerals  -Add Banatrol BID d/t diarrhea-provides 45kcal, 5g soluble fiber and 2g protein per serving. -Provides 2295 kcal, 127 gm protein, 1008 ml free water  daily     Consider addition of probiotic to stabilize loose stools   NUTRITION DIAGNOSIS:  Severe Malnutrition related to acute illness (may have chronic component due to time frame of reported wt loss) as evidenced by severe fat depletion, moderate muscle depletion.   GOAL:  Patient will meet greater than or equal to 90% of their needs  MONITOR:  Diet advancement, Labs, Weight trends, I & O's, Skin (TPN)  REASON FOR ASSESSMENT:   Consult, Rounds New TPN/TNA  ASSESSMENT:   73 yo male admitted post several days of N/V followed by sudden onset of severe epigastric pain radiating to chest and back. Pt found to have small esophageal perforation above GE junction. PMH includes CAD, chronic HFpEF, HTN, HLD, DVT, CVA, PAF, CKD 3b.  10/05 Admitted 10/06 Esophagogram/barium study: distal esophageal perforation, TPN started 10/07 S/p Esophageal stent, TPN at goal rate of 80 ml/hr 10/08  CLD 10/10 Diet advanced to Dysphagia 3, Calorie Count 10/13 No meal tickets for calorie count, pt reports eating bites only 10/15 Esophageal stent migration with leak on esophagram with  return to OR for stent repositioning, likely aspiration event requiring intubation 10/16 Extubated, Chest tube placed 10/17 Limited echo with EF 50-55%, RV mildly reduced 10/20 Intubated early AM, L pleural effusion, pigtail chest tube inserted 10/23 - extubated; limited echo: 60-65% 10/24 - esophagram: persistent leak 10/28 - transfer out of ICU 11/03 - PEG tube placement    Patient scheduled for PEG placement today. Met with him at bedside this morning. Predominantly concerned with obtaining chapstick as his lips are dry. TPN continues goal rate of 70 ml/hr providing 2301 kcals, 116 g of protein.  Discussed transition to enteral route of nutrition with patient. He had no questions. States his bowels are loose and MD attributing this to ABX use. Will prophylactically order Banatrol in an attempt to bulk stool. Recommend considering addition of probiotic to re-establish gut microbiome s/p ABX use.    Admit Weight: 72.8 kg Current Weight: 70.8 kg  No edema on exam. He reports no pain. Bowels stable, although loose.    CVP 4 this morning. BUN has started to trend back up as well as sodium at 141.  Albumin  also trended up to 1.9.  Remains on IV Lasix  at 120mg  daily, UOP 1.5L in 24 hours.    Corrected calcium  elevated. No calcium  noted in TPN formulation. Likely r/t immobility. Currently on Concentrated TPN. Sodium provision has been increased since last assessment.  Consider addition of more free water  as lab trends and UOP suggest free water  deficit. Will add low volume free water  flushes when TF initiated.      Intake/Output Summary (Last 24 hours) at 02/19/2024 1441 Last  data filed at 02/19/2024 1100 Gross per 24 hour  Intake 4050.65 ml  Output 1660 ml  Net 2390.65 ml      Drains/Lines: R brachial: PICC, triple lumen R pleural chest tube: 10 ml x 24 hours L pleural chest tube: 0ml x24 hours UOP: 1550 ml x24 hours   Labs: Sodium 141 (wdl) Chloride 103 (wdl) Potassium 4.2  (wdl) Corr Ca 11.2 (H) BUN 80 Creatinine 2.20 (H) Albumin  1.9 Magnesium  2.5 (H) Phosphorus 4.0 (wdl) WBC 13.2>16.9>8.9 (wdl) CBGs 111-135 x24 hours A1c 6.0 (01/2024)   Meds: Bisacodyl  suppository Dilaudid  Pantoprazole  Amio gtt IV Fluconazole IV ABX IV Lasix  IV Heparin    Diet Order:   Diet Order             Diet NPO time specified  Diet effective now            EDUCATION NEEDS:  Education needs have been addressed  Skin:  Skin Assessment: Skin Integrity Issues: Skin Integrity Issues:: Stage I Stage I: buttocks, bilateral feet  Last BM:  10/27  Height:  Ht Readings from Last 1 Encounters:  02/07/24 6' 1 (1.854 m)   Weight:  Wt Readings from Last 1 Encounters:  02/19/24 68.6 kg   Ideal Body Weight:  83.6 kg  BMI:  Body mass index is 19.95 kg/m.  Estimated Nutritional Needs:   Kcal:  2300-2500 kcals  Protein:  110-130 g  Fluid:  2L  Blair Deaner MS, RD, LDN Registered Dietitian Clinical Nutrition RD Inpatient Contact Info in Amion

## 2024-02-19 NOTE — Progress Notes (Signed)
 PHARMACY - ANTICOAGULATION CONSULT NOTE  Pharmacy Consult for heparin  infusion Indication: atrial fibrillation  Allergies  Allergen Reactions   Norvasc  [Amlodipine ] Swelling and Other (See Comments)    Excessive BLE swelling Skin discoloration, blotching   Jardiance  [Empagliflozin ] Itching and Other (See Comments)    Patient mentioned penile swelling, pain   Morphine And Codeine Itching    Opioid-induced pruritus    Patient Measurements: Height: 6' 1 (185.4 cm) Weight: 68.6 kg (151 lb 3.2 oz) IBW/kg (Calculated) : 79.9 HEPARIN  DW (KG): 80.6  Vital Signs: Temp: 97.8 F (36.6 C) (11/03 1600) Temp Source: Oral (11/03 1355) BP: 138/67 (11/03 1600) Pulse Rate: 62 (11/03 1600)  Labs: Recent Labs    02/18/24 0320 02/18/24 0521 02/18/24 0551 02/18/24 0924 02/19/24 0500  HGB 11.8* 12.8*  --   --  11.5*  HCT 39.2 40.5  --   --  36.4*  PLT 269 311  --   --  242  HEPARINUNFRC 0.16*  --  0.35 0.34 0.88*  CREATININE 1.91* 2.18*  --   --  2.20*    Estimated Creatinine Clearance: 29.9 mL/min (A) (by C-G formula based on SCr of 2.2 mg/dL (H)).   Assessment: 73 yo M presenting for esophageal rupture, now s/p esophageal stent placement on 10/6. PMH significant for HFrEF, Afib on Eliquis  PTA. Last dose of Eliquis  01/20/24, time unknown. Pharmacy consulted for heparin  management.  10/15 - underwent EGD with stent reposition on 10/15 after finding esophageal leak. Underwent PEG placement 11/3 - okay per CTVS to restart heparin  tonight.   Heparin  level earlier today came back at 0.88 - heparin  infusion was running peripherally and level likely drawn centrally. No s/sx of bleeding. Hgb 11.5, plt 242.   Goal of Therapy:  Heparin  level 0.3-0.5 units/ml Monitor platelets by anticoagulation protocol: Yes   Plan:  Will restart heparin  infusion at reduced rate of 1850 units/hr tonight Order heparin  level in 8 hours with AM labs  Monitor heparin  level daily Monitor CBC and s/sx of  bleeding daily  Thank you for allowing pharmacy to participate in this patient's care,  Suzen Sour, PharmD, BCCCP Clinical Pharmacist  Phone: [phone] 02/19/2024 6:45 PM  Please check AMION for all [facility] phone numbers After 10:00 PM, call Main Pharmacy [phone]

## 2024-02-19 NOTE — Anesthesia Preprocedure Evaluation (Addendum)
 Anesthesia Evaluation  Patient identified by MRN, date of birth, ID band Patient awake    Reviewed: Allergy & Precautions, NPO status , Patient's Chart, lab work & pertinent test results  History of Anesthesia Complications (+) PONV and history of anesthetic complications  Airway Mallampati: III       Dental no notable dental hx. (+) Dental Advisory Given   Pulmonary  Chest tube x 2 Currently on [place]   Pulmonary exam normal        Cardiovascular hypertension (146/72 preop), Pt. on medications + Past MI (2019), +CHF and + DVT (2017 s/p treatment w/ eliquis )  Normal cardiovascular exam+ dysrhythmias Atrial Fibrillation   Echo 02/08/24:  1. Left ventricular ejection fraction, by estimation, is 60 to 65%. The  left ventricle has normal function. The left ventricle has no regional  wall motion abnormalities. There is mild concentric left ventricular  hypertrophy. Left ventricular diastolic  parameters were normal.   2. Right ventricular systolic function is normal. The right ventricular  size is normal.   3. The mitral valve is grossly normal. Trivial mitral valve  regurgitation.   4. The aortic valve has an indeterminant number of cusps. There is  moderate calcification of the aortic valve. Aortic valve regurgitation is  not visualized. Aortic valve sclerosis/calcification is present, without  any evidence of aortic stenosis. Aortic   valve area, by VTI measures 2.29 cm. Aortic valve mean gradient measures  9.0 mmHg.     Neuro/Psych Peripheral neuropathy TIA   GI/Hepatic presented to [facility] ED 01/21/24 with several days of vomiting followed by an episode of severe tearing epigastric pain. Work-up revealed esophageal tear. Pt s/p esophageal stent with EGD 10/7. Return to OR for esophageal stent adjustment on 10/15, remained intubated after procedure due to suspected aspiration. Extubated 10/16.  Pt had CT placed 10/16. Reintubated  10/20-10/24. G tube placement planned for 11/3.   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)     Musculoskeletal   Abdominal   Peds  Hematology  (+) Blood dyscrasia (Eliquis ), anemia Hb 11.5, plt 242   Anesthesia Other Findings esophageal perforation  Reproductive/Obstetrics                              Anesthesia Physical Anesthesia Plan  ASA: 4  Anesthesia Plan: General   Post-op Pain Management: Tylenol  PO (pre-op)*   Induction: Intravenous and Rapid sequence  PONV Risk Score and Plan: 3 and Ondansetron , Dexamethasone , Midazolam  and Treatment may vary due to age or medical condition  Airway Management Planned: Oral ETT and Video Laryngoscope Planned  Additional Equipment: None  Intra-op Plan:   Post-operative Plan: Extubation in OR  Informed Consent: I have reviewed the patients History and Physical, chart, labs and discussed the procedure including the risks, benefits and alternatives for the proposed anesthesia with the patient or authorized representative who has indicated his/her understanding and acceptance.     Dental advisory given  Plan Discussed with: CRNA  Anesthesia Plan Comments: (Access: PIV x 1, PICC  Last airway note for last EGD: Induction Type: IV induction, Rapid sequence and Cricoid Pressure applied Laryngoscope Size: Glidescope and 4 Grade View: Grade I Tube type: Oral Tube size: 7.5 mm Number of attempts: 1 )         Anesthesia Quick Evaluation

## 2024-02-19 NOTE — Progress Notes (Signed)
 Occupational Therapy Treatment Patient Details Name: Billy [name] MRN: [id number] DOB: 1950-05-27 Today's Date: 02/19/2024   History of present illness Billy [name] is a 73 y.o. male who presented to [facility] ED 01/21/24 with several days of vomiting followed by an episode of severe tearing epigastric pain. Work-up revealed esophageal tear. Pt s/p esophageal stent with EGD 10/7. Return to OR for esophageal stent adjustment on 10/15, remained intubated after procedure due to suspected aspiration. Extubated 10/16.  Pt had CT placed 10/16. Reintubated 10/20-10/24. G tube placement planned for 11/3. PMHx: HFrEF, AFib, CAD, PE complicated by cardiogenic shock, CKD 4, MI, peripheral neuropathy, and TIA.   OT comments  Pt making continued progress towards OT goals though mildly agitated during therapy session today. Pt able to walk to sink using RW with Min A x 2 approx 5ft and stand > 5 min for various grooming tasks before fatigue/BLE shakiness noted. Pt opted to complete remainder of ADLs seated at sink. Based on medical complexities and deconditioning, recommend consideration of postacute rehab in [facility] setting if possible.  HR 70s-80s, SpO2 > 95% on RA      If plan is discharge home, recommend the following:  Two people to help with walking and/or transfers;Two people to help with bathing/dressing/bathroom;Assistance with cooking/housework;Assistance with feeding;Direct supervision/assist for medications management;Direct supervision/assist for financial management;Assist for transportation;Help with stairs or ramp for entrance   Equipment Recommendations  Other (comment) (TBD)    Recommendations for Other Services      Precautions / Restrictions Precautions Precautions: Fall Recall of Precautions/Restrictions: Impaired Precaution/Restrictions Comments: BIL chest tubes in place; 2 IV towers Restrictions Weight Bearing Restrictions Per Provider Order: No       Mobility Bed Mobility                General bed mobility comments: in recliner    Transfers Overall transfer level: Needs assistance Equipment used: Rolling walker (2 wheels) Transfers: Sit to/from Stand Sit to Stand: Min assist           General transfer comment: Min A to stand with RW from recliner     Balance Overall balance assessment: Needs assistance Sitting-balance support: Feet supported, Bilateral upper extremity supported Sitting balance-Leahy Scale: Fair     Standing balance support: During functional activity, Bilateral upper extremity supported, Reliant on assistive device for balance Standing balance-Leahy Scale: Poor                             ADL either performed or assessed with clinical judgement   ADL Overall ADL's : Needs assistance/impaired     Grooming: Minimal assistance;Standing;Contact guard assist;Oral care;Wash/dry face Grooming Details (indicate cue type and reason): CGA primarily with intermittent Min A needed for tasks that required both hands or assist to balance in standing             Lower Body Dressing: Moderate assistance;Sitting/lateral leans Lower Body Dressing Details (indicate cue type and reason): encouragement needed to attempt donning socks without assist. pt able to bring feet to self in chair, assisted over toes with pt then able to pull up B socks. easily frustrated by this task             Functional mobility during ADLs: Minimal assistance;+2 for physical assistance;+2 for safety/equipment;Rolling walker (2 wheels);Cueing for sequencing;Cueing for safety General ADL Comments: Emphasis on short mobility in room to sink using RW, standing tolerance > 5 min at sink before BLE shakiness  noted/rest break needed    Extremity/Trunk Assessment Upper Extremity Assessment Upper Extremity Assessment: Generalized weakness;Right hand dominant   Lower Extremity Assessment Lower Extremity Assessment: Defer to PT evaluation        Vision    Vision Assessment?: No apparent visual deficits   Perception     Praxis     Communication Communication Communication: Impaired Factors Affecting Communication: Difficulty expressing self   Cognition Arousal: Alert Behavior During Therapy: Flat affect, Agitated Cognition: Cognition impaired     Awareness: Online awareness impaired Memory impairment (select all impairments): Working memory Attention impairment (select first level of impairment): Sustained attention Executive functioning impairment (select all impairments): Organization, Reasoning, Problem solving OT - Cognition Comments: slower processing, agitated at times today. repetitive statements, asking for more time for tasks, cues for problem solving, sequencing and attempting tasks without assist needed                 Following commands: Impaired Following commands impaired: Follows one step commands with increased time      Cueing   Cueing Techniques: Verbal cues, Gestural cues, Tactile cues  Exercises      Shoulder Instructions       General Comments      Pertinent Vitals/ Pain       Pain Assessment Pain Assessment: No/denies pain  Home Living                                          Prior Functioning/Environment              Frequency  Min 2X/week        Progress Toward Goals  OT Goals(current goals can now be found in the care plan section)  Progress towards OT goals: Progressing toward goals  Acute Rehab OT Goals OT Goal Formulation: With patient Time For Goal Achievement: 03/04/24 Potential to Achieve Goals: Fair  Plan      Co-evaluation                 AM-PAC OT 6 Clicks Daily Activity     Outcome Measure   Help from another person eating meals?: Total Help from another person taking care of personal grooming?: A Little Help from another person toileting, which includes using toliet, bedpan, or urinal?: A Little Help from another person  bathing (including washing, rinsing, drying)?: A Lot Help from another person to put on and taking off regular upper body clothing?: A Lot Help from another person to put on and taking off regular lower body clothing?: A Lot 6 Click Score: 13    End of Session Equipment Utilized During Treatment: Rolling walker (2 wheels)  OT Visit Diagnosis: Muscle weakness (generalized) (M62.81)   Activity Tolerance Patient tolerated treatment well   Patient Left in chair;with call bell/phone within reach;with chair alarm set   Nurse Communication Mobility status        Time: 0822-0907 OT Time Calculation (min): 45 min  Charges: OT General Charges $OT Visit: 1 Visit OT Treatments $Self Care/Home Management : 23-37 mins $Therapeutic Activity: 8-22 mins  Mliss NOVAK, OTR/L Acute Rehab Services Office: [phone]   Mliss Fish 02/19/2024, 10:39 AM

## 2024-02-19 NOTE — Progress Notes (Signed)
     [address]       [place] [address]             [phone]       No events OR today for PEG tube placement  Naithen Rivenburg O Yandriel Boening

## 2024-02-19 NOTE — Progress Notes (Signed)
 Advanced Heart Failure Rounding Note  Cardiologist: Dorn Lesches, MD  Chief Complaint: S/p esophageal stent Subjective:    - 10/6: esoph stent placement - 10/15: S/p EGD with stent repositioning by Dr. Shyrl  - 10/23: Ltd echo EF 60-65%  - 10/24: Barium swallow showed persistent leak   HTN overnight, has PRN hydral. Weight stable. sCr 2.06>1.91>2.18>2.2  Going to OR for PEG today  Sitting up in bed. Pain significantly improved from last week, off PCA. Has been working with PT. No SOB or CP.   Objective:    Weight Range: 68.6 kg Body mass index is 19.95 kg/m.   Vital Signs:   Temp:  [97.6 F (36.4 C)-98 F (36.7 C)] 97.6 F (36.4 C) (11/03 1210) Pulse Rate:  [61-72] 67 (11/03 1210) Resp:  [13-23] 15 (11/03 1242) BP: (127-170)/(64-79) 170/79 (11/03 1210) SpO2:  [95 %-99 %] 96 % (11/03 1210) FiO2 (%):  [32 %-33 %] 33 % (11/02 1657) Weight:  [68.6 kg] 68.6 kg (11/03 0442) Last BM Date : 02/18/24  Weight change: Filed Weights   02/15/24 0500 02/17/24 0203 02/19/24 0442  Weight: 70.1 kg 68.8 kg 68.6 kg   Intake/Output:  Intake/Output Summary (Last 24 hours) at 02/19/2024 1309 Last data filed at 02/19/2024 1100 Gross per 24 hour  Intake 4050.65 ml  Output 1660 ml  Net 2390.65 ml    Physical Exam   General: Elderly appearing. No distress  Cardiac: JVP flat. S1 and S2 present. No murmurs Extremities: Warm and dry.  No peripheral edema.  Neuro: Alert and oriented x3. Affect flat  Telemetry   SR 60s (personally reviewed)  Labs    CBC Recent Labs    02/18/24 0521 02/19/24 0500  WBC 16.9* 8.9  HGB 12.8* 11.5*  HCT 40.5 36.4*  MCV 88.0 87.5  PLT 311 242   Basic Metabolic Panel Recent Labs    88/97/74 0521 02/19/24 0500  NA 139 141  K 4.2 4.2  CL 102 103  CO2 23 26  GLUCOSE 111* 135*  BUN 74* 80*  CREATININE 2.18* 2.20*  CALCIUM  9.5 9.5  MG 2.4 2.5*  PHOS 4.1 4.0   Liver Function Tests Recent Labs    02/18/24 0521 02/19/24 0500   AST  --  12*  ALT  --  15  ALKPHOS  --  72  BILITOT  --  0.5  PROT  --  7.1  ALBUMIN  2.1* 1.9*   BNP (last 3 results) Recent Labs    03/09/23 1544 03/11/23 0218 06/27/23 1116  BNP 660.9* 491.3* 646.4*   Hemoglobin A1C No results for input(s): HGBA1C in the last 72 hours.  Fasting Lipid Panel Recent Labs    02/19/24 0500  TRIG 56    Medications:    Scheduled Medications:  bisacodyl   10 mg Rectal Q0600   Chlorhexidine  Gluconate Cloth  6 each Topical Daily   [name] butt cream   Topical BID   HYDROmorphone    Intravenous Q4H   lidocaine   1 patch Transdermal Q24H   pantoprazole  (PROTONIX ) IV  40 mg Intravenous Q12H   sodium chloride  flush  10 mL Intrapleural Q8H   sodium chloride  flush  10-40 mL Intracatheter Q12H    Infusions:  amiodarone  30 mg/hr (02/19/24 1112)   fluconazole (DIFLUCAN) IV 200 mg (02/19/24 1001)   furosemide  120 mg (02/19/24 1006)   heparin  Stopped (02/19/24 1257)   piperacillin -tazobactam (ZOSYN )  IV 12.5 mL/hr at 02/19/24 0600   TPN ADULT (ION) 70 mL/hr at 02/19/24  0600   TPN ADULT (ION)      PRN Medications: diclofenac Sodium, diphenhydrAMINE **OR** diphenhydrAMINE, hydrALAZINE , levalbuterol, lip balm, LORazepam , naloxone  **AND** sodium chloride  flush, ondansetron  (ZOFRAN ) IV, mouth rinse, sodium chloride  flush  Patient Profile    Patient with longstanding history of chronic heart failure with preserved ejection fraction, RV failure with history of pulmonary embolism presents with esophageal perforation.  S/P Stent Esophageal Stent. Stent repositioned 10/15.  Assessment/Plan   1.  Esophageal Perforation - Boerhaave syndrome.  - CT C/A/P: with pneumomediastinum and large mass-like density in lower esophagus - 10/6: EGD with esophageal stent placed  - 10/15: Stent migrated w/ leak > back to OR for repositioning  - 10/24: barium swallow w persistent leak  - On TPN; Strict NPO - Continues on fluconazole; abx escalated d/t PNA, now  on Zosyn  + Meropenum. Primary managing. - EGD with PEG tube placement today  2. Acute on chronic HFpEF: h/o cardiogenic shock with RV failure in the setting of cardiac arrest thought to be caused by acute PE vs ACS in 3/25. Echo at the time showed EF 55%, severe, LVH, G2DD, and severely reduced RV function with strain. There was concern for cardiac sarcoid amyloid, however CMR LGE consistent with ischemic disease. RV on echo 9/25 with complete recovery, EF 60-65%, nl RV function.    - RHC 3/25: Mild PAH with severe RV failure though CO is preserved.   - Repeat echo 10/25 EF 60-65% - Euvolemic on exam. Weight stable. - continue IV lasix  daily with TPN to keep even, continue 120 mg daily. May need to adjust once off TPN - Continue TED hose  3. 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 Medical management.  - suspect CP related to esophageal issues and not CAD - strict NPO, holding PO meds   4.  AKI on CKD Stage 3b: Baseline sCr ~ 2.1. - Creatinine peaked to 2.6.  - overall improved, SCr 2.2 today     5. PAF:   - s/p TEE/DCCV 3/25 - has converted to AF a couple times this admit - in NSR on tele   - continue IV amio (NPO) and heparin   6.  H/o DVT/PE:  Bilateral upper and lower DVTs, mixed acute and chronic.  - s/p infrarenal IVC placement 3/25 - V/Q 3/25 with multiple perfusion defects - continue IV heparin . Will need lifelong AC, transition to Eliquis  once taking PO - no change   7. Carotid stenosis: h/o CVA. TCAR in 6/24. Okay to remain off plavix  at this point. - carotid US  9/25: widely patent carotid stent on R and no stenosis on L   8. Acute Hypoxic Respiratory Failure/PNA/ Exudative Lt Pleural Effusion  - on zosyn  and merom - CT placed for pleural effusion. Resp Cx w/ rare yeast, BCx NGTD  - remains on [place]  9. Severe protein calorie malnutrition - Nutrition following. On TPN   Gyselle Matthew,  NP 02/19/2024  Advanced Heart Failure Team Pager [phone] (M-F; 7a - 5p)  Please contact [facility] for night-coverage after hours (4p -7a ) and weekends on [email or web address]

## 2024-02-19 NOTE — Op Note (Signed)
      [address]       [name] [address]             [phone]        02/19/2024  Patient:  Billy [name] Pre-Op Dx: malnutrition Hx of esophageal perforation   Post-op Dx:  same Procedure: - Esophagogastroscopy - 83F Percutaneous gastroscopy tube placement   Surgeon and Role:      * Paxten Appelt, Linnie KIDD, MD - Primary   Anesthesia  general EBL:  5ml Blood Administration: none Specimen:  none   Counts: correct   Indications: 73yo male s/p esophageal stent placement of perforation.  He has a continuous leak, and requires enteral nutrition.   Findings: Normal anatomy  Operative Technique: After the risks, benefits and alternatives were thoroughly discussed, the patient was brought to the operative theatre.  Anesthesia was induced. The patient was prepped and draped in normal sterile fashion.  An appropriate surgical pause was performed, and pre-operative antibiotics were dosed accordingly.  The gastroscope was advanced through the oropharynx into the cervical esophagus under direct visualization.  The scope was passed into the stomach.    The scope was then advanced back into the stomach, which was then insufflated.  1:1 external compression through the abdominal wall was evident, and we were able to trans-illuminate through the skin.  Using Seldinger technique, a needle was passed through the skin, and a wire was passed and grasped via the gastroscope.  The PEG tube was then positioned using a pull technique.  The PEG tube button was then secured at the skin at 4cm.    The patient tolerated the procedure without any immediate complications, and was transferred to the PACU in stable condition.  Ayrabella Labombard KIDD Rayas

## 2024-02-19 NOTE — Progress Notes (Signed)
 PHARMACY - TOTAL PARENTERAL NUTRITION CONSULT NOTE   Indication: intolerance to enteral feedings, esophageal perforation  Patient Measurements: Height: 6' 1 (185.4 cm) Weight: 68.6 kg (151 lb 3.2 oz) IBW/kg (Calculated) : 79.9 TPN AdjBW (KG): 80.6 Body mass index is 19.95 kg/m.  Assessment:  73 year old man with medical history significant for cardiac arrest earlier this year who was admitted after several days of nausea and vomiting followed by sudden onset severe tearing epigastric pain radiating to back and chest. 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 Pharmacy consulted to initiate TPN due to inability to tolerate enteral nutrition at this time.  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 PTA takes torsemide  20 mg PO daily, now held. Aggressive fluid resuscitation recommended per GI. Per HF, patient currently euvolemic but will hold diuretic for now. Due to CHF will target lower end of fluid range for TPN and supplement additional fluids per MD outside of TPN to prevent fluid overload.   Stent placed on 10/6, esophogram not showing any leak. Diet advanced to CLD. 10/15 Leak present and made NPO. 10/24 esophagram with persistent leak; will need stent replacement in 3-4 weeks.   10/28 AM update: patient has weights that are significantly more than admission weight of 72.7 kg on 10/5. This may represent false weights from bed scale.  Current weight is 71.6 kg  11/2 initial labs spurious  Glucose / Insulin : A1C 6.0% (02/2023);  BG <140, 13 units insulin  in TPN  Electrolytes: Na 141, K 4.2, Cl 103, CO2 26, Ca 9.5 [CoCa 11.18], Phos 4, Mg 2.5 Renal: SCr 2.20 (BL ~2), BUN 80 Hepatic: AST 12 /ALT 15 /tbili 0.5 /alk phos 72, alb 1.9, TG 56 Intake / Output; MIVF: furosemide  down 120 mg IV daily starting 11/1; UOP 0.94 mL/kg/hr, chest tube 10 ml, LBM 11/2 Net IO Since Admission: 4,849.65 mL [02/19/24 0855]  GI Imaging: 10/05 CT: concerning  for esophageal perforation, masslike area of soft tissue thickening adjacent to and surrounding the esophagus  10/06 Esoph XR: distal esophageal perforation 10/7 Esoph XR: No leak associated with stent coverage of esophageal perforation 10/14 KUB: Gas-filled loops of small bowel and colon suggestive of ileus 10/15 Esoph XR: esophageal perforation/leak at the distal esophagus, proximal to the esophageal stent 10/24 esophagram: persistent leak despite being covered by the esophageal stent.  GI Surgeries / Procedures:  10/6: esophageal stent placement  10/15 EGD and stent repositioning   Central access: 01/22/24 TPN start date: 01/22/24  Nutritional Goals:  Goal concentrated TPN is 70 ml/hr (provides 116 g of protein and 2301 kcals per day  RD Assessment:  Estimated Needs Total Energy Estimated Needs: 2300-2500 kcals Total Protein Estimated Needs: 115-130 g Total Fluid Estimated Needs: 2L  Current Nutrition:  TPN/NPO  10/13- 10/14 DYS1: not taking any po  10/15: NPO d/t increased abd distension  10/22 Propofol  at 7.6- 20.2 ml/hr, providing ~370 kcals/24hr- OFF 10/23: switch propofol  back to dexmedetomidine - OFF  Plan:  Continue concentrated TPN at goal 70 mL/hr, providing 100% estimated nutritional needs   Electrolytes in TPN: Na 15 mEq/L, K 60 mEq/L, Ca 0 mEq/L, Mg 0 mEq/L, Phos 10 mmol/L. Cl:Ac max acetate (cannot do 1:1 or 1:2 due to no Na in bag ) Continue standard MVI and trace elements to TPN Continue 13 units insulin  regular to TPN Monitor TPN labs on Mon/Thurs, and PRN   Thank you for involving pharmacy in this patient's care.   Myles Mallicoat BS, PharmD, BCPS  Clinical Pharmacist 02/19/2024 6:55 AM  Contact: [phone] after 3 PM

## 2024-02-19 NOTE — Transfer of Care (Signed)
 Immediate Anesthesia Transfer of Care Note  Patient: Billy [name]  Procedure(s) Performed: EGD (ESOPHAGOGASTRODUODENOSCOPY) (Esophagus) INSERTION, PEG TUBE  Patient Location: PACU  Anesthesia Type:General  Level of Consciousness: drowsy  Airway & Oxygen  Therapy: Patient Spontanous Breathing  Post-op Assessment: Report given to RN and Post -op Vital signs reviewed and stable  Post vital signs: Reviewed and stable  Last Vitals:  Vitals Value Taken Time  BP 161/71 02/19/24 15:22  Temp    Pulse 65 02/19/24 15:23  Resp 17 02/19/24 15:23  SpO2 98 % 02/19/24 15:23  Vitals shown include unfiled device data.  Last Pain:  Vitals:   02/19/24 1355  TempSrc: Oral  PainSc: 0-No pain      Patients Stated Pain Goal: 2 (02/19/24 1355)  Complications: No notable events documented.

## 2024-02-19 NOTE — Progress Notes (Signed)
 PROGRESS NOTE  Billy [name] FMW:[id number] DOB: Dec 23, 1950   PCP: Seabron Lenis, MD  Patient is from: Home.  DOA: 01/21/2024 LOS: 29  Chief complaints Chief Complaint  Patient presents with   Abdominal Pain   Emesis     Brief Narrative / Interim history: 73 year old M with PMH of cardiac arrest earlier this year along with HFrEF, Afib, PE presented with several days of nausea and vomiting from a stomach bug followed by sudden onset severe tearing epigastric pain radiating to back and chest. Workup revealed esophageal tear/Boerhaave.  Patient had esophageal stent placed on 10/7.  Subsequent swallow study revealed an additional leak, and he had stent repositioned on 01/31/2024.  He became hypoxic with gastric contents noted on intubation, and he was transferred to ICU for mechanical ventilation.  Bedside ultrasound with right pleural effusion for which he had chest tube placed with removal of 300 cc exudative fluid.  He was also successfully extubated on 10/16.  Patient has had increased oxygen  requirement requiring reintubation on 10/20.  Bedside ultrasound showed left-sided pleural effusion and he had left-sided chest tube with 120 cc exudative output. ICU stay complicated by high O2 requirement, left sided pleural effusion, chest tube placement 10/16, reintubated 10/20.  Eventually, patient was extubated and transferred out of ICU on 10/28.  Hospital course complicated by acute on chronic HFpEF for which she is on IV Lasix  per advanced heart failure team.  Subjective: Seen and examined earlier this morning.  No major events overnight or this morning.  Up in chair by the sink brushing his teeth.  Therapy at bedside.   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 on TPN for nutrition.  Plan for PEG tube today per  CTS. -Bilateral chest tube in place.  -Now on IV Zosyn  per CTS.    Acute hypoxic/ hypercapnic respiratory failure-multifactorial including aspiration pneumonia, pleural effusion and CHF.  Trach aspirate on 10/15 with Candida albicans. -IV meropenem>> IV Zosyn  for aspiration pneumonia. -On Diflucan for Candida albicans from trach aspirate 10/15>> -Chest tube for pleural effusion per CTS -IV Lasix  per advanced heart failure team -Continue bronchodilators   Acute on chronic diastolic CHF-Limited TTE on 10/23 with LVEF of 60 to 65%.  RHC in 3/25 with mild PAH with severe RV failure but preserved CO.  Appears euvolemic on exam. -On IV Lasix  120 mg daily per advanced heart failure team while on TPN. 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  AKI seems to have resolved. -Continue monitoring   Paroxysmal A-fib: s/p TEE DCCV 3/25.  Seems to be in sinus rhythm. -Amiodarone  drip per cardiology -On heparin  for anticoagulation -Optimize electrolyte   Intractable pain-reports a little pain in his left chest at chest tube site but rates his pain 11/10.  Required ketamine while in ICU.  Currently on IV Dilaudid  PCA. -Continue Dilaudid  PCA.  Hopefully we can use oxycodone  after PEG tube  H/o PE/ DVT- s/p infrarenal IVC placement 3/25. V/Q 3/25 with multiple perfusion defects - Continue IV heparin .  Essential hypertension: BP within acceptable range. -Pain control as above -Diuretics as above. -IV hydralazine  as needed  Unwitnessed fall in the hospital: Found down on the floor the evening of 10/31.  No apparent injury or focal neurodeficit. - Fall precaution  Anxiety disorder: Stable. -Ativan  as needed   Acute on chronic anemia -  Monitor H&H.   Leukocytosis: Slightly worse today. - Antibiotics as above.  GERD - IV PPI.  Goal of care: Noted DNR paperwork in [facility] but patient confirms full CODE STATUS.Billy [name]   Severe protein  calorie malnutrition/hypoalbuminemia Body mass index is 19.95 kg/m. Nutrition Problem: Severe Malnutrition Etiology: acute illness (may have chronic component due to time frame of reported wt loss) Signs/Symptoms: severe fat depletion, moderate muscle depletion Interventions: Refer to RD note for recommendations  Pressure skin injury Wound 02/07/24 0900 Pressure Injury Foot Anterior;Right Stage 1 -  Intact skin with non-blanchable redness of a localized area usually over a bony prominence. (Active)     Wound 02/07/24 0900 Pressure Injury Foot Anterior;Left Stage 1 -  Intact skin with non-blanchable redness of a localized area usually over a bony prominence. (Active)     Wound 02/07/24 0900 Pressure Injury Buttocks Right Stage 1 -  Intact skin with non-blanchable redness of a localized area usually over a bony prominence. (Active)   DVT prophylaxis:  SCDs Start: 01/21/24 2023  Code Status: Full code Family Communication: None at bedside today. Level of care: Telemetry Cardiac Status is: Inpatient Remains inpatient appropriate because: Esophageal rupture, acute CHF,   Final disposition: Yet to be determined.   35 minutes with more than 50% spent in reviewing records, counseling patient/family and coordinating care.  Consultants:  Gastroenterology Critical care Cardiology Cardiothoracic surgery  Procedures: See above.  Microbiology summarized: 10/5-MRSA PCR screen positive 10/15-trach aspirate culture with Candida albicans.  Objective: Vitals:   02/19/24 0436 02/19/24 0442 02/19/24 0718 02/19/24 0734  BP: (!) 157/76  (!) 146/72   Pulse: 61  62   Resp: 16  17 13   Temp: 97.8 F (36.6 C)  97.6 F (36.4 C)   TempSrc: Oral  Oral   SpO2: 98%  96%   Weight:  68.6 kg    Height:        Examination:  GENERAL: No apparent distress.  Nontoxic. HEENT: MMM.  Vision and hearing grossly intact.  NECK: Supple.  No apparent JVD.  RESP:  No IWOB.  Fair aeration bilaterally.   Chest tube to left chest. CVS:  RRR. Heart sounds normal.  ABD/GI/GU: BS+. Abd soft, NTND.  MSK/EXT:  Moves extremities. No apparent deformity. No edema.  SKIN: no apparent skin lesion or wound NEURO: AA.  Oriented appropriately.  No apparent focal neuro deficit. PSYCH: Calm. Normal affect.   Sch Meds:  Scheduled Meds:  bisacodyl   10 mg Rectal Q0600   Chlorhexidine  Gluconate Cloth  6 each Topical Daily   [name] butt cream   Topical BID   HYDROmorphone    Intravenous Q4H   lidocaine   1 patch Transdermal Q24H   pantoprazole  (PROTONIX ) IV  40 mg Intravenous Q12H   sodium chloride  flush  10 mL Intrapleural Q8H   sodium chloride  flush  10-40 mL Intracatheter Q12H   Continuous Infusions:  amiodarone  30 mg/hr (02/19/24 0600)   fluconazole (DIFLUCAN) IV 200 mg (02/19/24 1001)   furosemide  120 mg (02/19/24 1006)   heparin  1,950 Units/hr (02/19/24 0600)   piperacillin -tazobactam (ZOSYN )  IV 12.5 mL/hr at 02/19/24 0600   TPN ADULT (ION) 70 mL/hr at 02/19/24 0600   TPN ADULT (ION)     PRN Meds:.diclofenac Sodium, diphenhydrAMINE **OR** diphenhydrAMINE, hydrALAZINE , levalbuterol, LORazepam , naloxone  **AND** sodium chloride  flush, ondansetron  (ZOFRAN ) IV, mouth rinse, sodium chloride  flush  Antimicrobials: Anti-infectives (From admission, onward)    Start     Dose/Rate Route Frequency Ordered Stop   02/13/24 0900  piperacillin -tazobactam (  ZOSYN ) IVPB 3.375 g        3.375 g 12.5 mL/hr over 240 Minutes Intravenous Every 8 hours 02/13/24 0812     02/07/24 0800  vancomycin (VANCOCIN) IVPB 1000 mg/200 mL premix  Status:  Discontinued        1,000 mg 200 mL/hr over 60 Minutes Intravenous Every 24 hours 02/07/24 0632 02/09/24 0905   02/06/24 2200  meropenem (MERREM) 1 g in sodium chloride  0.9 % 100 mL IVPB  Status:  Discontinued        1 g 200 mL/hr over 30 Minutes Intravenous Every 12 hours 02/06/24 1127 02/13/24 0812   02/06/24 1300  vancomycin (VANCOREADY) IVPB 1750 mg/350 mL  Status:   Discontinued        1,750 mg 175 mL/hr over 120 Minutes Intravenous Every 48 hours 02/04/24 1225 02/04/24 1226   02/06/24 0800  vancomycin (VANCOCIN) IVPB 1000 mg/200 mL premix        1,000 mg 200 mL/hr over 60 Minutes Intravenous  Once 02/06/24 0629 02/07/24 0829   02/04/24 1400  meropenem (MERREM) 1 g in sodium chloride  0.9 % 100 mL IVPB  Status:  Discontinued        1 g 200 mL/hr over 30 Minutes Intravenous Every 8 hours 02/04/24 1208 02/06/24 1127   02/04/24 1300  linezolid  (ZYVOX ) IVPB 600 mg  Status:  Discontinued        600 mg 300 mL/hr over 60 Minutes Intravenous Every 12 hours 02/04/24 1202 02/04/24 1208   02/04/24 1300  vancomycin (VANCOREADY) IVPB 2000 mg/400 mL        2,000 mg 200 mL/hr over 120 Minutes Intravenous  Once 02/04/24 1208 02/04/24 1623   02/04/24 1208  vancomycin variable dose per unstable renal function (pharmacist dosing)  Status:  Discontinued         Does not apply See admin instructions 02/04/24 1208 02/07/24 0632   01/31/24 1030  fluconazole (DIFLUCAN) IVPB 200 mg        200 mg 100 mL/hr over 60 Minutes Intravenous Daily 01/31/24 0938     01/30/24 1445  piperacillin -tazobactam (ZOSYN ) IVPB 3.375 g  Status:  Discontinued        3.375 g 12.5 mL/hr over 240 Minutes Intravenous Every 8 hours 01/30/24 1354 02/04/24 1203   01/26/24 1000  amoxicillin-clavulanate (AUGMENTIN) 400-57 MG/5ML suspension 875 mg  Status:  Discontinued        875 mg Oral Every 12 hours 01/26/24 0658 01/30/24 1354   01/26/24 1000  fluconazole (DIFLUCAN) 40 MG/ML suspension 200 mg  Status:  Discontinued        200 mg Oral Daily 01/26/24 0658 01/31/24 0938   01/22/24 1445  fluconazole (DIFLUCAN) IVPB 400 mg  Status:  Discontinued        400 mg 100 mL/hr over 120 Minutes Intravenous Every 24 hours 01/22/24 1436 01/22/24 1439   01/22/24 1445  fluconazole (DIFLUCAN) IVPB 200 mg  Status:  Discontinued        200 mg 100 mL/hr over 60 Minutes Intravenous Every 24 hours 01/22/24 1439 01/26/24  0658   01/21/24 2200  piperacillin -tazobactam (ZOSYN ) IVPB 3.375 g  Status:  Discontinued       Placed in Followed by Linked Group   3.375 g 12.5 mL/hr over 240 Minutes Intravenous Every 8 hours 01/21/24 1607 01/26/24 0658   01/21/24 1615  piperacillin -tazobactam (ZOSYN ) IVPB 3.375 g       Placed in Followed by Linked Group   3.375 g 100 mL/hr over  30 Minutes Intravenous  Once 01/21/24 1607 01/21/24 1651        I have personally reviewed the following labs and images: CBC: Recent Labs  Lab 02/16/24 0440 02/17/24 0400 02/18/24 0320 02/18/24 0521 02/19/24 0500  WBC 12.3* 12.2* 13.2* 16.9* 8.9  HGB 10.8* 11.3* 11.8* 12.8* 11.5*  HCT 34.7* 35.9* 39.2 40.5 36.4*  MCV 88.3 89.1 95.4 88.0 87.5  PLT 289 281 269 311 242   BMP &GFR Recent Labs  Lab 02/16/24 0440 02/17/24 0400 02/18/24 0320 02/18/24 0521 02/19/24 0500  NA 143 143 132* 139 141  K 3.7 4.1 6.7* 4.2 4.2  CL 104 104 98 102 103  CO2 27 27 22 23 26   GLUCOSE 109* 131* 767* 111* 135*  BUN 73* 73* 69* 74* 80*  CREATININE 2.00* 2.06* 1.91* 2.18* 2.20*  CALCIUM  9.4 9.7 8.3* 9.5 9.5  MG 2.0 1.9 2.4 2.4 2.5*  PHOS 4.3 4.1 6.0* 4.1 4.0   Estimated Creatinine Clearance: 29.9 mL/min (A) (by C-G formula based on SCr of 2.2 mg/dL (H)). Liver & Pancreas: Recent Labs  Lab 02/15/24 0601 02/16/24 0440 02/17/24 0400 02/18/24 0320 02/18/24 0521 02/19/24 0500  AST 15  --   --   --   --  12*  ALT 19  --   --   --   --  15  ALKPHOS 95  --   --   --   --  72  BILITOT 0.5  --   --   --   --  0.5  PROT 7.1  --   --   --   --  7.1  ALBUMIN  1.8* 1.8* 2.0* 1.8* 2.1* 1.9*   No results for input(s): LIPASE, AMYLASE in the last 168 hours. No results for input(s): AMMONIA in the last 168 hours. Diabetic: No results for input(s): HGBA1C in the last 72 hours. Recent Labs  Lab 02/12/24 1601  GLUCAP 137*   Cardiac Enzymes: No results for input(s): CKTOTAL, CKMB, CKMBINDEX, TROPONINI in the last 168  hours. No results for input(s): PROBNP in the last 8760 hours. Coagulation Profile: No results for input(s): INR, PROTIME in the last 168 hours. Thyroid  Function Tests: No results for input(s): TSH, T4TOTAL, FREET4, T3FREE, THYROIDAB in the last 72 hours. Lipid Profile: Recent Labs    02/19/24 0500  TRIG 56    Anemia Panel: No results for input(s): VITAMINB12, FOLATE, FERRITIN, TIBC, IRON , RETICCTPCT in the last 72 hours. Urine analysis:    Component Value Date/Time   COLORURINE YELLOW 07/04/2023 1141   APPEARANCEUR HAZY (A) 07/04/2023 1141   LABSPEC 1.010 07/04/2023 1141   PHURINE 5.0 07/04/2023 1141   GLUCOSEU NEGATIVE 07/04/2023 1141   HGBUR NEGATIVE 07/04/2023 1141   BILIRUBINUR NEGATIVE 07/04/2023 1141   KETONESUR NEGATIVE 07/04/2023 1141   PROTEINUR NEGATIVE 07/04/2023 1141   UROBILINOGEN 0.2 08/31/2019 1136   NITRITE NEGATIVE 07/04/2023 1141   LEUKOCYTESUR NEGATIVE 07/04/2023 1141   Sepsis Labs: Invalid input(s): PROCALCITONIN, LACTICIDVEN  Microbiology: No results found for this or any previous visit (from the past 240 hours).   Radiology Studies: No results found.    Abigale Dorow T. Minha Fulco [facility]  If 7PM-7AM, please contact night-coverage [email or web address] 02/19/2024, 10:23 AM

## 2024-02-19 NOTE — Anesthesia Procedure Notes (Signed)
 Procedure Name: Intubation Date/Time: 02/19/2024 2:37 PM  Performed by: Elby Raelene SAUNDERS, CRNAPre-anesthesia Checklist: Patient identified, Emergency Drugs available, Suction available and Patient being monitored Patient Re-evaluated:Patient Re-evaluated prior to induction Oxygen  Delivery Method: Circle System Utilized Preoxygenation: Pre-oxygenation with 100% oxygen  Induction Type: IV induction Ventilation: Mask ventilation without difficulty Laryngoscope Size: Glidescope and 3 Grade View: Grade II Tube type: Oral Tube size: 8.0 mm Number of attempts: 1 Airway Equipment and Method: Stylet Placement Confirmation: ETT inserted through vocal cords under direct vision, positive ETCO2 and breath sounds checked- equal and bilateral Secured at: 23 cm Tube secured with: Tape Dental Injury: Teeth and Oropharynx as per pre-operative assessment

## 2024-02-20 ENCOUNTER — Encounter (HOSPITAL_COMMUNITY): Payer: Self-pay | Admitting: Thoracic Surgery (Cardiothoracic Vascular Surgery)

## 2024-02-20 DIAGNOSIS — I5023 Acute on chronic systolic (congestive) heart failure: Secondary | ICD-10-CM | POA: Diagnosis not present

## 2024-02-20 DIAGNOSIS — E43 Unspecified severe protein-calorie malnutrition: Secondary | ICD-10-CM | POA: Diagnosis not present

## 2024-02-20 DIAGNOSIS — K223 Perforation of esophagus: Secondary | ICD-10-CM | POA: Diagnosis not present

## 2024-02-20 DIAGNOSIS — J90 Pleural effusion, not elsewhere classified: Secondary | ICD-10-CM | POA: Diagnosis not present

## 2024-02-20 LAB — GLUCOSE, CAPILLARY
Glucose-Capillary: 142 mg/dL — ABNORMAL HIGH (ref 70–99)
Glucose-Capillary: 154 mg/dL — ABNORMAL HIGH (ref 70–99)

## 2024-02-20 LAB — CBC
HCT: 35.8 % — ABNORMAL LOW (ref 39.0–52.0)
Hemoglobin: 11.4 g/dL — ABNORMAL LOW (ref 13.0–17.0)
MCH: 27.9 pg (ref 26.0–34.0)
MCHC: 31.8 g/dL (ref 30.0–36.0)
MCV: 87.7 fL (ref 80.0–100.0)
Platelets: 220 K/uL (ref 150–400)
RBC: 4.08 MIL/uL — ABNORMAL LOW (ref 4.22–5.81)
RDW: 18.9 % — ABNORMAL HIGH (ref 11.5–15.5)
WBC: 10.7 K/uL — ABNORMAL HIGH (ref 4.0–10.5)
nRBC: 0 % (ref 0.0–0.2)

## 2024-02-20 LAB — HEPARIN LEVEL (UNFRACTIONATED): Heparin Unfractionated: 0.45 [IU]/mL (ref 0.30–0.70)

## 2024-02-20 LAB — MAGNESIUM: Magnesium: 2.4 mg/dL (ref 1.7–2.4)

## 2024-02-20 MED ORDER — TORSEMIDE 20 MG PO TABS
60.0000 mg | ORAL_TABLET | Freq: Every day | ORAL | Status: DC
  Filled 2024-02-20: qty 3

## 2024-02-20 MED ORDER — PANTOPRAZOLE SODIUM 40 MG PO TBEC: 40.0000 mg | DELAYED_RELEASE_TABLET | Freq: Two times a day (BID) | ORAL | Status: DC

## 2024-02-20 MED ORDER — PANTOPRAZOLE SODIUM 40 MG IV SOLR
40.0000 mg | Freq: Two times a day (BID) | INTRAVENOUS | Status: DC
  Administered 2024-02-20 – 2024-04-19 (×118): 40 mg via INTRAVENOUS
  Filled 2024-02-20 (×108): qty 10

## 2024-02-20 MED ORDER — HYDROMORPHONE HCL 1 MG/ML IJ SOLN
0.5000 mg | Freq: Once | INTRAMUSCULAR | Status: AC
  Administered 2024-02-20: 0.5 mg via INTRAVENOUS
  Filled 2024-02-20: qty 0.5

## 2024-02-20 MED ORDER — NON FORMULARY
1000.0000 mL | Status: DC
Start: 2024-02-20 — End: 2024-02-20

## 2024-02-20 MED ORDER — TRACE MINERALS CU-MN-SE-ZN 300-55-60-3000 MCG/ML IV SOLN
INTRAVENOUS | Status: AC
  Filled 2024-02-20: qty 772.8

## 2024-02-20 MED ORDER — APIXABAN 5 MG PO TABS: 5.0000 mg | ORAL_TABLET | Freq: Two times a day (BID) | ORAL | Status: DC

## 2024-02-20 MED ORDER — ACETAMINOPHEN 160 MG/5ML PO SOLN
650.0000 mg | Freq: Three times a day (TID) | ORAL | Status: DC | PRN
  Administered 2024-02-20 – 2024-04-04 (×8): 650 mg
  Filled 2024-02-20 (×9): qty 20.3

## 2024-02-20 MED ORDER — OSMOLITE 1.2 CAL PO LIQD: 1000.0000 mL | ORAL | Status: DC

## 2024-02-20 MED ORDER — KATE FARMS STANDARD 1.4 EN LIQD
1000.0000 mL | ENTERAL | Status: DC
  Administered 2024-02-20: 1000 mL
  Filled 2024-02-20 (×3): qty 1000

## 2024-02-20 MED ORDER — AMIODARONE HCL 200 MG PO TABS
200.0000 mg | ORAL_TABLET | Freq: Two times a day (BID) | ORAL | Status: DC
  Administered 2024-02-20 – 2024-03-16 (×50): 200 mg
  Filled 2024-02-20 (×50): qty 1

## 2024-02-20 MED ORDER — APIXABAN 5 MG PO TABS
5.0000 mg | ORAL_TABLET | Freq: Two times a day (BID) | ORAL | Status: DC
  Administered 2024-02-20 – 2024-03-17 (×53): 5 mg
  Filled 2024-02-20 (×53): qty 1

## 2024-02-20 MED ORDER — AMIODARONE HCL 200 MG PO TABS: 200.0000 mg | ORAL_TABLET | Freq: Two times a day (BID) | ORAL | Status: DC

## 2024-02-20 MED ORDER — HYDROMORPHONE 1 MG/ML IV SOLN
INTRAVENOUS | Status: DC
  Administered 2024-02-20: 1.8 mg via INTRAVENOUS

## 2024-02-20 MED ORDER — OXYCODONE HCL 5 MG/5ML PO SOLN
5.0000 mg | ORAL | Status: DC | PRN
  Administered 2024-02-20 – 2024-03-23 (×40): 5 mg
  Filled 2024-02-20 (×47): qty 5

## 2024-02-20 MED ORDER — BANATROL TF EN LIQD
60.0000 mL | Freq: Two times a day (BID) | ENTERAL | Status: DC
  Administered 2024-02-20 – 2024-02-23 (×7): 60 mL
  Filled 2024-02-20 (×7): qty 60

## 2024-02-20 MED ORDER — HYDROMORPHONE 1 MG/ML IV SOLN
INTRAVENOUS | Status: DC
  Administered 2024-02-20: 0.2 mg via INTRAVENOUS
  Administered 2024-02-20: 1.2 mg via INTRAVENOUS
  Administered 2024-02-20: 2.2 mg via INTRAVENOUS
  Administered 2024-02-21: 1.2 mg via INTRAVENOUS
  Administered 2024-02-21: 1.4 mg via INTRAVENOUS
  Administered 2024-02-21 (×2): 2 mg via INTRAVENOUS
  Administered 2024-02-21: 1.2 mg via INTRAVENOUS
  Administered 2024-02-22: 1.4 mg via INTRAVENOUS
  Administered 2024-02-22: 3 mg via INTRAVENOUS
  Administered 2024-02-22: 2.8 mg via INTRAVENOUS
  Administered 2024-02-22 – 2024-02-23 (×3): 1.8 mg via INTRAVENOUS
  Administered 2024-02-23: 30 mg via INTRAVENOUS
  Administered 2024-02-23: 1 mg via INTRAVENOUS
  Administered 2024-02-23: 2.4 mg via INTRAVENOUS
  Administered 2024-02-24: 0.4 mg via INTRAVENOUS
  Administered 2024-02-24: 0.8 mg via INTRAVENOUS
  Administered 2024-02-24: 0.6 mg via INTRAVENOUS
  Administered 2024-02-24: 0.8 mg via INTRAVENOUS
  Administered 2024-02-25: 2 mg via INTRAVENOUS
  Administered 2024-02-25: 0.4 mg via INTRAVENOUS
  Administered 2024-02-26: 30 mg via INTRAVENOUS
  Administered 2024-02-26: 0.8 mg via INTRAVENOUS
  Administered 2024-02-26: 2 mg via INTRAVENOUS
  Administered 2024-02-26: 1.8 mg via INTRAVENOUS
  Administered 2024-02-26: 2 mg via INTRAVENOUS
  Administered 2024-02-27: 1.2 mg via INTRAVENOUS
  Administered 2024-02-27: 3.4 mg via INTRAVENOUS
  Administered 2024-02-27: 0.6 mg via INTRAVENOUS
  Administered 2024-02-27: 0.4 mg via INTRAVENOUS
  Filled 2024-02-20 (×2): qty 30

## 2024-02-20 NOTE — Progress Notes (Signed)
 PHARMACY - ANTICOAGULATION CONSULT NOTE  Pharmacy Consult for heparin  infusion Indication: atrial fibrillation  Allergies  Allergen Reactions   Norvasc  [Amlodipine ] Swelling and Other (See Comments)    Excessive BLE swelling Skin discoloration, blotching   Jardiance  [Empagliflozin ] Itching and Other (See Comments)    Patient mentioned penile swelling, pain   Morphine And Codeine Itching    Opioid-induced pruritus    Patient Measurements: Height: 6' 1 (185.4 cm) Weight: 71.4 kg (157 lb 6.5 oz) IBW/kg (Calculated) : 79.9 HEPARIN  DW (KG): 80.6  Vital Signs: Temp: 97.9 F (36.6 C) (11/04 1101) Temp Source: Oral (11/04 1101) BP: 145/72 (11/04 1101) Pulse Rate: 67 (11/04 1101)  Labs: Recent Labs    02/18/24 0320 02/18/24 0521 02/18/24 0551 02/18/24 0924 02/19/24 0500 02/20/24 0616 02/20/24 0617  HGB 11.8* 12.8*  --   --  11.5*  --  11.4*  HCT 39.2 40.5  --   --  36.4*  --  35.8*  PLT 269 311  --   --  242  --  220  HEPARINUNFRC 0.16*  --    < > 0.34 0.88* 0.45  --   CREATININE 1.91* 2.18*  --   --  2.20*  --   --    < > = values in this interval not displayed.    Estimated Creatinine Clearance: 31.1 mL/min (A) (by C-G formula based on SCr of 2.2 mg/dL (H)).   Assessment: 73 yo M presenting for esophageal rupture, now s/p esophageal stent placement on 10/6. PMH significant for HFrEF, Afib on Eliquis  PTA. Last dose of Eliquis  01/20/24, time unknown. Pharmacy consulted for heparin  management.  10/15 - underwent EGD with stent reposition on 10/15 after finding esophageal leak. Underwent PEG placement 11/3 - okay per CTVS to restart heparin  tonight.   Heparin  level earlier today came back at 0.45 - within goal range. No s/sx of bleeding. Hgb 11.4, plt 220.   Goal of Therapy:  Heparin  level 0.3-0.5 units/ml Monitor platelets by anticoagulation protocol: Yes   Plan:  Pharmacy asked to transition heparin  back to po Eliquis  since pt now has PEG tube. Eliquis  5 mg po  BID.  Harlene Barlow, Berdine [employer], BCCP Clinical Pharmacist  02/20/2024 1:19 PM   [facility] pharmacy phone numbers are listed on [email or web address]

## 2024-02-20 NOTE — Progress Notes (Addendum)
 Nutrition Brief Note  Received consult to start tube feedings today s/p PEG placement yesterday. Will enter orders with slow titration up, as gut has not been used for some time. Will monitor tolerance as well and ability to wean TPN as TF titrated upward. Added Banatrol BID due to ongoing diarrhea. May be ABX related. Consider addition of probiotic.   INTERVENTION:    Continue TPN to meet 100% estimated nutritional needs until TF can be initiated and titrated up to goal rate w/ patient tolerating -Recommend considering increasing free water  in TPN (un-concentrating) -Continue MVI w/ trace elements added to TPN   Start TF via PEG: Billy [name] 1.5 Peptide at 60 ml/h (1440 ml per day) Initiate at 20ml/hr and increase by 10ml/hr q12h -Prosource TF20 60ml daily -Recommend low volume FWF when TF initiated -Add MVI w/ minerals             -Add Banatrol BID d/t diarrhea-provides 45kcal, 5g soluble fiber and 2g protein per serving. -Provides 2295 kcal, 127 gm protein, 1008 ml free water  daily      Consider addition of probiotic to stabilize loose stools     NUTRITION DIAGNOSIS:  Severe Malnutrition related to acute illness (may have chronic component due to time frame of reported wt loss) as evidenced by severe fat depletion, moderate muscle depletion.     GOAL:  Patient will meet greater than or equal to 90% of their needs    Blair Deaner MS, RD, LDN Registered Dietitian Clinical Nutrition RD Inpatient Contact Info in Amion

## 2024-02-20 NOTE — Progress Notes (Signed)
 Advanced Heart Failure Rounding Note  Cardiologist: Dorn Lesches, MD  Chief Complaint: S/p esophageal stent Subjective:    - 10/6: esoph stent placement - 10/15: S/p EGD with stent repositioning by Dr. Shyrl  - 10/23: Ltd echo EF 60-65%  - 10/24: Barium swallow showed persistent leak  - 02/19/23: PEG tube placed  PEG tube placed yesterday.   Remains in NSR. On lasix  120 IV daily. Weight up 5 pounds per chart. No bleeding on heparin .     Objective:    Weight Range: 71.4 kg Body mass index is 20.77 kg/m.   Vital Signs:   Temp:  [97.6 F (36.4 C)-98.3 F (36.8 C)] 97.7 F (36.5 C) (11/04 0738) Pulse Rate:  [61-69] 65 (11/04 0738) Resp:  [10-20] 10 (11/04 0850) BP: (138-172)/(62-82) 155/68 (11/04 0738) SpO2:  [94 %-99 %] 97 % (11/04 0738) Weight:  [71.4 kg] 71.4 kg (11/04 0500) Last BM Date : 02/19/24  Weight change: Filed Weights   02/17/24 0203 02/19/24 0442 02/20/24 0500  Weight: 68.8 kg 68.6 kg 71.4 kg   Intake/Output:  Intake/Output Summary (Last 24 hours) at 02/20/2024 1003 Last data filed at 02/20/2024 0737 Gross per 24 hour  Intake 1558.96 ml  Output 1574 ml  Net -15.04 ml    Physical Exam   General: Elderly appearing. No distress  Cardiac: JVP flat. S1 and S2 present. No murmurs Extremities: Warm and dry.  No peripheral edema.  Neuro: Alert and oriented x3. Affect flat  Telemetry   SR 60s (personally reviewed)  Labs    CBC Recent Labs    02/19/24 0500 02/20/24 0617  WBC 8.9 10.7*  HGB 11.5* 11.4*  HCT 36.4* 35.8*  MCV 87.5 87.7  PLT 242 220   Basic Metabolic Panel Recent Labs    88/97/74 0521 02/19/24 0500 02/20/24 0617  NA 139 141  --   K 4.2 4.2  --   CL 102 103  --   CO2 23 26  --   GLUCOSE 111* 135*  --   BUN 74* 80*  --   CREATININE 2.18* 2.20*  --   CALCIUM  9.5 9.5  --   MG 2.4 2.5* 2.4  PHOS 4.1 4.0  --    Liver Function Tests Recent Labs    02/18/24 0521 02/19/24 0500  AST  --  12*  ALT  --  15   ALKPHOS  --  72  BILITOT  --  0.5  PROT  --  7.1  ALBUMIN  2.1* 1.9*   BNP (last 3 results) Recent Labs    03/09/23 1544 03/11/23 0218 06/27/23 1116  BNP 660.9* 491.3* 646.4*   Hemoglobin A1C No results for input(s): HGBA1C in the last 72 hours.  Fasting Lipid Panel Recent Labs    02/19/24 0500  TRIG 56    Medications:    Scheduled Medications:  bisacodyl   10 mg Rectal Q0600   Chlorhexidine  Gluconate Cloth  6 each Topical Daily   [name] butt cream   Topical BID   HYDROmorphone    Intravenous Q4H   lidocaine   1 patch Transdermal Q24H   pantoprazole  (PROTONIX ) IV  40 mg Intravenous Q12H   sodium chloride  flush  10 mL Intrapleural Q8H   sodium chloride  flush  10-40 mL Intracatheter Q12H    Infusions:  amiodarone  30 mg/hr (02/20/24 0844)   fluconazole (DIFLUCAN) IV 200 mg (02/20/24 0850)   furosemide  Stopped (02/19/24 1117)   heparin  1,850 Units/hr (02/20/24 0327)   piperacillin -tazobactam (ZOSYN )  IV 3.375  g (02/20/24 0610)   TPN ADULT (ION) 70 mL/hr at 02/19/24 1849   TPN ADULT (ION)      PRN Medications: diclofenac Sodium, diphenhydrAMINE **OR** diphenhydrAMINE, hydrALAZINE , levalbuterol, lip balm, LORazepam , naloxone  **AND** sodium chloride  flush, ondansetron  (ZOFRAN ) IV, mouth rinse, sodium chloride  flush  Patient Profile    Patient with longstanding history of chronic heart failure with preserved ejection fraction, RV failure with history of pulmonary embolism presents with esophageal perforation.  S/P Stent Esophageal Stent. Stent repositioned 10/15.  Assessment/Plan   1.  Esophageal Perforation - Boerhaave syndrome.  - CT C/A/P: with pneumomediastinum and large mass-like density in lower esophagus - 10/6: EGD with esophageal stent placed  - 10/15: Stent migrated w/ leak > back to OR for repositioning  - 10/24: barium swallow w persistent leak  - On TPN; Underwent PEG tube placement on 11/3 -> can now start feeds per primary team. Will switch  cardiac meds to po - Continues on fluconazole; abx escalated d/t PNA, now on Zosyn  Primary managing.  2. Acute on chronic HFpEF: h/o cardiogenic shock with RV failure in the setting of cardiac arrest thought to be caused by acute PE vs ACS in 3/25. Echo at the time showed EF 55%, severe, LVH, G2DD, and severely reduced RV function with strain. There was concern for cardiac sarcoid amyloid, however CMR LGE consistent with ischemic disease. RV on echo 9/25 with complete recovery, EF 60-65%, nl RV function.    - RHC 3/25: Mild PAH with severe RV failure though CO is preserved.   - Repeat echo 10/25 EF 60-65% - Appears euvolemic on exam but weight up? Will switch IV lasix  to po torsemide  60 daily - adjust as needed - Continue TED hose  3. 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 Medical management.  - suspect CP related to esophageal issues and not CAD - No s/s angina   4.  AKI on CKD Stage 3b: Baseline sCr ~ 2.1. - Creatinine peaked to 2.6.  -no labs this am yet     5. PAF:   - s/p TEE/DCCV 3/25 - has converted to AF a couple times this admit - in NSR on tele   - switch IV amio and heparin  to po amio and Eliquis . D/w pharmd  6.  H/o DVT/PE:  Bilateral upper and lower DVTs, mixed acute and chronic.  - s/p infrarenal IVC placement 3/25 - V/Q 3/25 with multiple perfusion defects - continue IV heparin . Will need lifelong AC, transition to Eliquis  today   7. Carotid stenosis: h/o CVA. TCAR in 6/24. Okay to remain off plavix  at this point. - carotid US  9/25: widely patent carotid stent on R and no stenosis on L   8. Acute Hypoxic Respiratory Failure/PNA/ Exudative Lt Pleural Effusion  - on zosyn   - CT placed for pleural effusion. Resp Cx w/ rare yeast, BCx NGTD  - remains on [place] - [name] managing  9. Severe protein calorie malnutrition - Nutrition following. On TPN  - now with PEG tube. Can begin feeds   Toribio Fuel,  MD 02/20/2024  Advanced Heart Failure Team Pager [phone] (M-F; 7a - 5p)  Please contact [facility] for night-coverage after hours (4p -7a ) and weekends on [email or web address]

## 2024-02-20 NOTE — Progress Notes (Signed)
     [address]       [place] [address]             [phone]       Ok to use PEG tube today  Billy [name]

## 2024-02-20 NOTE — Progress Notes (Signed)
 PHARMACY - TOTAL PARENTERAL NUTRITION CONSULT NOTE   Indication: intolerance to enteral feedings, esophageal perforation  Patient Measurements: Height: 6' 1 (185.4 cm) Weight: 71.4 kg (157 lb 6.5 oz) IBW/kg (Calculated) : 79.9 TPN AdjBW (KG): 80.6 Body mass index is 20.77 kg/m.  Assessment:  73 year old man with medical history significant for cardiac arrest earlier this year who was admitted after several days of nausea and vomiting followed by sudden onset severe tearing epigastric pain radiating to back and chest. 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 Pharmacy consulted to initiate TPN due to inability to tolerate enteral nutrition at this time.  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 PTA takes torsemide  20 mg PO daily, now held. Aggressive fluid resuscitation recommended per GI. Per HF, patient currently euvolemic but will hold diuretic for now. Due to CHF will target lower end of fluid range for TPN and supplement additional fluids per MD outside of TPN to prevent fluid overload.   Stent placed on 10/6, esophogram not showing any leak. Diet advanced to CLD. 10/15 Leak present and made NPO. 10/24 esophagram with persistent leak; will need stent replacement in 3-4 weeks.   10/28 AM update: patient has weights that are significantly more than admission weight of 72.7 kg on 10/5. This may represent false weights from bed scale.  Current weight is 71.6 kg  11/2 initial labs spurious  Glucose / Insulin : A1C 6.0% (02/2023);  BG <140, 13 units insulin  in TPN  Dexamethasone  10 mg given 11/3 Electrolytes: Na 141, K 4.2, Cl 103, CO2 26, Ca 9.5 [CoCa 11.18], Phos 4, Mg 2.4 Renal: SCr 2.20 (BL ~2), BUN 80 Hepatic: AST 12 /ALT 15 /tbili 0.5 /alk phos 72, alb 1.9, TG 56 Intake / Output; MIVF: furosemide  down 120 mg IV daily starting 11/1; UOP 0.85 mL/kg/hr, chest tube 24 ml, LBM 11/3 [2+ unmeasured] Net IO Since Admission: 4,634.61 mL  [02/20/24 0830]  GI Imaging: 10/05 CT: concerning for esophageal perforation, masslike area of soft tissue thickening adjacent to and surrounding the esophagus  10/06 Esoph XR: distal esophageal perforation 10/7 Esoph XR: No leak associated with stent coverage of esophageal perforation 10/14 KUB: Gas-filled loops of small bowel and colon suggestive of ileus 10/15 Esoph XR: esophageal perforation/leak at the distal esophagus, proximal to the esophageal stent 10/24 esophagram: persistent leak despite being covered by the esophageal stent.  GI Surgeries / Procedures:  10/6: esophageal stent placement  10/15 EGD and stent repositioning   Central access: 01/22/24 TPN start date: 01/22/24  Nutritional Goals:  Goal concentrated TPN is 70 ml/hr (provides 116 g of protein and 2301 kcals per day  RD Assessment:  Estimated Needs Total Energy Estimated Needs: 2300-2500 kcals Total Protein Estimated Needs: 110-130 g Total Fluid Estimated Needs: 2L  Current Nutrition:  TPN/NPO  10/13- 10/14 DYS1: not taking any po  10/15: NPO d/t increased abd distension  10/22 Propofol  at 7.6- 20.2 ml/hr, providing ~370 kcals/24hr- OFF 10/23: switch propofol  back to dexmedetomidine - OFF  Plan:  Continue concentrated TPN at goal 70 mL/hr, providing 100% estimated nutritional needs   Electrolytes in TPN: Na 15 mEq/L, K 60 mEq/L, Ca 0 mEq/L, Mg 0 mEq/L, Phos 10 mmol/L. Cl:Ac max acetate (cannot do 1:1 or 1:2 due to no Na in bag ) Continue standard MVI and trace elements to TPN Continue 13 units insulin  regular to TPN Monitor TPN labs on Mon/Thurs, and PRN   Thank you for involving pharmacy in this patient's care.  Benedetta Heath BS, PharmD, BCPS Clinical Pharmacist 02/20/2024 8:16 AM  Contact: [phone] after 3 PM

## 2024-02-20 NOTE — Anesthesia Postprocedure Evaluation (Signed)
 Anesthesia Post Note  Patient: Billy [name]  Procedure(s) Performed: EGD (ESOPHAGOGASTRODUODENOSCOPY) (Esophagus) INSERTION, PEG TUBE     Patient location during evaluation: PACU Anesthesia Type: General Level of consciousness: awake Pain management: pain level controlled Vital Signs Assessment: post-procedure vital signs reviewed and stable Respiratory status: spontaneous breathing, nonlabored ventilation and respiratory function stable Cardiovascular status: blood pressure returned to baseline and stable Postop Assessment: no apparent nausea or vomiting Anesthetic complications: no   No notable events documented.  Last Vitals:  Vitals:   02/19/24 2359 02/20/24 0329  BP: (!) 147/77 (!) 172/75  Pulse: 63 61  Resp: 16 15  Temp: 36.5 C 36.6 C  SpO2: 97% 98%    Last Pain:  Vitals:   02/20/24 0329  TempSrc: Oral  PainSc: 10-Worst pain ever                 [name] [name]

## 2024-02-20 NOTE — Plan of Care (Signed)
  Problem: Education: Goal: Knowledge of General Education information will improve Description: Including pain rating scale, medication(s)/side effects and non-pharmacologic comfort measures Outcome: Progressing   Problem: Health Behavior/Discharge Planning: Goal: Ability to manage health-related needs will improve Outcome: Progressing   Problem: Clinical Measurements: Goal: Will remain free from infection Outcome: Progressing Goal: Respiratory complications will improve Outcome: Progressing Goal: Cardiovascular complication will be avoided Outcome: Progressing   

## 2024-02-20 NOTE — Progress Notes (Signed)
 [name] night cross cover note:   I was notified by the patient's RN that the patient is complaining of 11/10 pain around his recently placed PEG tube We will currently on Dilaudid  PCA with settings that include Q 10-minute bolus of 0.2 mg. Not currently on a continuous Dilaudid  infusion.   I subsequently increased his q10 minute bolus dose from 0.2 mg to 0.3 , increased his 1 hour lockout, and ordered a one-time additional dose of 0.5 mg iv dilaudid  to try to improve his pain control.    Eva Pore, DO Hospitalist

## 2024-02-20 NOTE — Progress Notes (Signed)
 Chest tubes on water  seal. Order for bilateral chest tubes to be on suction. Suction placed back on. On call PA notified, and triad notified as well.

## 2024-02-20 NOTE — TOC Progression Note (Signed)
 Transition of Care [facility]) - Progression Note    Patient Details  Name: Pratt Bress MRN: [id number] Date of Birth: Aug 08, 1950  [facility]) CM/SW Contact  Justina Delcia Czar, RN Phone Number: [phone] 02/20/2024, 3:09 PM  Clinical Narrative:    Continues on TPN, IV abx and heparin  gtt. Spoke to pt and states if he needs SNF rehab, he will be willing because he lives alone. Gave permission to speak to son, sister, dtr or ex-wife. Gave permission to create FL2 and fax referral.    Chart reviewed for discharge readiness, patient not medically stable for d/c. Inpatient CM/CSW will continue to monitor pt's advancement through interdisciplinary progression rounds.    If new pt transition needs arise, MD please place a TOC consult.     Expected Discharge Plan: Skilled Nursing Facility Barriers to Discharge: Continued Medical Work up    Expected Discharge Plan and Services In-house Referral: Clinical Social Work Discharge Planning Services: CM Consult Post Acute Care Choice: Skilled Nursing Facility Living arrangements for the past 2 months: Single Family Home                                       Social Drivers of Health (SDOH) Interventions SDOH Screenings   Food Insecurity: No Food Insecurity (01/22/2024)  Housing: High Risk (01/22/2024)  Transportation Needs: No Transportation Needs (01/22/2024)  Utilities: Not At Risk (01/22/2024)  Depression (PHQ2-9): Low Risk  (12/01/2020)  Social Connections: Socially Isolated (01/22/2024)  Tobacco Use: Low Risk  (02/19/2024)    Readmission Risk Interventions    06/27/2023    1:52 PM 10/14/2022   11:29 AM  Readmission Risk Prevention Plan  Post Dischage Appt  Complete  Medication Screening  Complete  Transportation Screening Complete Complete  HRI or Home Care Consult Complete   Social Work Consult for Recovery Care Planning/Counseling Complete   Palliative Care Screening Not Applicable   Medication Review Special Educational Needs Teacher) Referral to Pharmacy

## 2024-02-20 NOTE — Progress Notes (Signed)
 PROGRESS NOTE  Billy [name] FMW:[id number] DOB: 1950/10/18   PCP: Seabron Lenis, MD  Patient is from: Home.  DOA: 01/21/2024 LOS: 30  Chief complaints Chief Complaint  Patient presents with   Abdominal Pain   Emesis     Brief Narrative / Interim history: 73 year old M with PMH of cardiac arrest earlier this year along with HFrEF, Afib, PE presented with several days of nausea and vomiting from a stomach bug followed by sudden onset severe tearing epigastric pain radiating to back and chest. Workup revealed esophageal tear/Boerhaave.  Patient had esophageal stent placed on 10/7.  Subsequent swallow study revealed an additional leak, and he had stent repositioned on 01/31/2024.  He became hypoxic with gastric contents noted on intubation, and he was transferred to ICU for mechanical ventilation.  Bedside ultrasound with right pleural effusion for which he had chest tube placed with removal of 300 cc exudative fluid.  He was also successfully extubated on 10/16.  Patient has had increased oxygen  requirement requiring reintubation on 10/20.  Bedside ultrasound showed left-sided pleural effusion and he had left-sided chest tube with 120 cc exudative output. ICU stay complicated by high O2 requirement, left sided pleural effusion, chest tube placement 10/16, reintubated 10/20.  Eventually, patient was extubated and transferred out of ICU on 10/28.  Hospital course complicated by acute on chronic HFpEF for which she is on IV Lasix  per advanced heart failure team.  PEG tube placed on 11/3.  Dietitian consulted for initiation of tube feed.  Remains on TPN until tube feed at goal.  Subjective: Seen and examined earlier this morning.  No major events overnight or this morning.  No complaints other than feeling sore at PEG tube site.   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  RD consulted for initiation of TF -Continue TPN until TF at goal -Bilateral chest tube in place.  CTS managing -On IV Zosyn  per CTS.    Acute hypoxic/ hypercapnic respiratory failure-multifactorial including aspiration pneumonia, pleural effusion and CHF.  Trach aspirate on 10/15 with Candida albicans. -IV meropenem>> IV Zosyn  for pneumonia per CTS. -Diflucan for Candida albicans from trach aspirate from 10/15-11/4. -Chest tube for pleural effusion per CTS -IV Lasix  per advanced heart failure team -Continue bronchodilators   Acute on chronic diastolic CHF-Limited TTE on 10/23 with LVEF of 60 to 65%.  RHC in 3/25 with mild PAH with severe RV failure but preserved CO.  Appears euvolemic on exam. -Diuretics per advanced heart failure team.  Transitioning to p.o. now he has PEG tube. 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  AKI seems to have resolved. -Continue monitoring   Paroxysmal A-fib: s/p TEE DCCV 3/25.  Seems to be in sinus rhythm. -Transitioned to p.o. amiodarone  and Eliquis  -Optimize electrolyte   Intractable pain-  Required ketamine and started on IV Dilaudid  PCA while in ICU. -Start oxycodone  per PEG tube -Decreasing PCA with a goal to wean off or changed to as needed Dilaudid .  H/o PE/ DVT- s/p infrarenal IVC placement 3/25. V/Q 3/25 with multiple perfusion defects - Now on Eliquis .  Essential hypertension: BP within acceptable range. -Pain control as above -Diuretics as above. -IV hydralazine  as needed  Unwitnessed fall in the hospital: Found down on the floor the evening of 10/31.  No apparent injury or focal  neurodeficit. - Fall precaution  Anxiety disorder: Stable. -Ativan  as needed   Acute on chronic anemia -Monitor H&H.   Leukocytosis: Resolved.  GERD - IV PPI.  Goal of care: Noted DNR paperwork in [facility] but  patient confirms full CODE STATUS.SABRA   Severe protein calorie malnutrition/hypoalbuminemia Body mass index is 20.77 kg/m. Nutrition Problem: Severe Malnutrition Etiology: acute illness (may have chronic component due to time frame of reported wt loss) Signs/Symptoms: severe fat depletion, moderate muscle depletion Interventions: Refer to RD note for recommendations  Pressure skin injury Wound 02/07/24 0900 Pressure Injury Foot Anterior;Right Stage 1 -  Intact skin with non-blanchable redness of a localized area usually over a bony prominence. (Active)     Wound 02/07/24 0900 Pressure Injury Foot Anterior;Left Stage 1 -  Intact skin with non-blanchable redness of a localized area usually over a bony prominence. (Active)     Wound 02/07/24 0900 Pressure Injury Buttocks Right Stage 1 -  Intact skin with non-blanchable redness of a localized area usually over a bony prominence. (Active)   DVT prophylaxis:  SCDs Start: 01/21/24 2023 apixaban  (ELIQUIS ) tablet 5 mg  Code Status: Full code Family Communication: None at bedside today. Level of care: Progressive Status is: Inpatient Remains inpatient appropriate because: Esophageal rupture, acute CHF,   Final disposition: Yet to be determined.   35 minutes with more than 50% spent in reviewing records, counseling patient/family and coordinating care.  Consultants:  Gastroenterology Critical care Cardiology Cardiothoracic surgery  Procedures: See above.  Microbiology summarized: 10/5-MRSA PCR screen positive 10/15-trach aspirate culture with Candida albicans.  Objective: Vitals:   02/20/24 0500 02/20/24 0738 02/20/24 0850 02/20/24 1101  BP:  (!) 155/68  (!) 145/72  Pulse:  65  67  Resp:  13 10 14   Temp:  97.7 F (36.5 C)  97.9 F (36.6 C)  TempSrc:  Oral  Oral  SpO2:  97%  97%  Weight: 71.4 kg     Height:        Examination:  GENERAL: No apparent distress.  Nontoxic. HEENT: MMM.  Vision and hearing grossly intact.   NECK: Supple.  No apparent JVD.  RESP:  No IWOB.  Fair aeration bilaterally.  Chest tube to left chest. CVS:  RRR. Heart sounds normal.  ABD/GI/GU: BS+. Abd soft, NTND.  PEG tube in place. MSK/EXT:  Moves extremities. No apparent deformity. No edema.  SKIN: no apparent skin lesion or wound NEURO: AA.  Oriented appropriately.  No apparent focal neuro deficit. PSYCH: Calm. Normal affect.   Sch Meds:  Scheduled Meds:  amiodarone   200 mg Per Tube BID   apixaban   5 mg Per Tube BID   bisacodyl   10 mg Rectal Q0600   Chlorhexidine  Gluconate Cloth  6 each Topical Daily   [name] butt cream   Topical BID   HYDROmorphone    Intravenous Q4H   lidocaine   1 patch Transdermal Q24H   pantoprazole  (PROTONIX ) IV  40 mg Intravenous Q12H   sodium chloride  flush  10 mL Intrapleural Q8H   sodium chloride  flush  10-40 mL Intracatheter Q12H   [START ON 02/21/2024] torsemide   60 mg Oral Daily   Continuous Infusions:  fluconazole (DIFLUCAN) IV 200 mg (02/20/24 0850)   furosemide  120 mg (02/20/24 1044)   piperacillin -tazobactam (ZOSYN )  IV 3.375 g (02/20/24 0610)   TPN ADULT (ION) 70 mL/hr at 02/19/24 1849   TPN ADULT (ION)     PRN Meds:.acetaminophen  (TYLENOL ) oral liquid 160 mg/5 mL, diclofenac Sodium, diphenhydrAMINE **OR** diphenhydrAMINE, hydrALAZINE ,  levalbuterol, lip balm, naloxone  **AND** sodium chloride  flush, ondansetron  (ZOFRAN ) IV, mouth rinse, oxyCODONE , sodium chloride  flush  Antimicrobials: Anti-infectives (From admission, onward)    Start     Dose/Rate Route Frequency Ordered Stop   02/13/24 0900  piperacillin -tazobactam (ZOSYN ) IVPB 3.375 g        3.375 g 12.5 mL/hr over 240 Minutes Intravenous Every 8 hours 02/13/24 0812     02/07/24 0800  vancomycin (VANCOCIN) IVPB 1000 mg/200 mL premix  Status:  Discontinued        1,000 mg 200 mL/hr over 60 Minutes Intravenous Every 24 hours 02/07/24 0632 02/09/24 0905   02/06/24 2200  meropenem (MERREM) 1 g in sodium chloride  0.9 % 100 mL IVPB   Status:  Discontinued        1 g 200 mL/hr over 30 Minutes Intravenous Every 12 hours 02/06/24 1127 02/13/24 0812   02/06/24 1300  vancomycin (VANCOREADY) IVPB 1750 mg/350 mL  Status:  Discontinued        1,750 mg 175 mL/hr over 120 Minutes Intravenous Every 48 hours 02/04/24 1225 02/04/24 1226   02/06/24 0800  vancomycin (VANCOCIN) IVPB 1000 mg/200 mL premix        1,000 mg 200 mL/hr over 60 Minutes Intravenous  Once 02/06/24 0629 02/07/24 0829   02/04/24 1400  meropenem (MERREM) 1 g in sodium chloride  0.9 % 100 mL IVPB  Status:  Discontinued        1 g 200 mL/hr over 30 Minutes Intravenous Every 8 hours 02/04/24 1208 02/06/24 1127   02/04/24 1300  linezolid  (ZYVOX ) IVPB 600 mg  Status:  Discontinued        600 mg 300 mL/hr over 60 Minutes Intravenous Every 12 hours 02/04/24 1202 02/04/24 1208   02/04/24 1300  vancomycin (VANCOREADY) IVPB 2000 mg/400 mL        2,000 mg 200 mL/hr over 120 Minutes Intravenous  Once 02/04/24 1208 02/04/24 1623   02/04/24 1208  vancomycin variable dose per unstable renal function (pharmacist dosing)  Status:  Discontinued         Does not apply See admin instructions 02/04/24 1208 02/07/24 0632   01/31/24 1030  fluconazole (DIFLUCAN) IVPB 200 mg        200 mg 100 mL/hr over 60 Minutes Intravenous Daily 01/31/24 0938     01/30/24 1445  piperacillin -tazobactam (ZOSYN ) IVPB 3.375 g  Status:  Discontinued        3.375 g 12.5 mL/hr over 240 Minutes Intravenous Every 8 hours 01/30/24 1354 02/04/24 1203   01/26/24 1000  amoxicillin-clavulanate (AUGMENTIN) 400-57 MG/5ML suspension 875 mg  Status:  Discontinued        875 mg Oral Every 12 hours 01/26/24 0658 01/30/24 1354   01/26/24 1000  fluconazole (DIFLUCAN) 40 MG/ML suspension 200 mg  Status:  Discontinued        200 mg Oral Daily 01/26/24 0658 01/31/24 0938   01/22/24 1445  fluconazole (DIFLUCAN) IVPB 400 mg  Status:  Discontinued        400 mg 100 mL/hr over 120 Minutes Intravenous Every 24 hours 01/22/24  1436 01/22/24 1439   01/22/24 1445  fluconazole (DIFLUCAN) IVPB 200 mg  Status:  Discontinued        200 mg 100 mL/hr over 60 Minutes Intravenous Every 24 hours 01/22/24 1439 01/26/24 0658   01/21/24 2200  piperacillin -tazobactam (ZOSYN ) IVPB 3.375 g  Status:  Discontinued       Placed in Followed by Linked Group   3.375 g  12.5 mL/hr over 240 Minutes Intravenous Every 8 hours 01/21/24 1607 01/26/24 0658   01/21/24 1615  piperacillin -tazobactam (ZOSYN ) IVPB 3.375 g       Placed in Followed by Linked Group   3.375 g 100 mL/hr over 30 Minutes Intravenous  Once 01/21/24 1607 01/21/24 1651        I have personally reviewed the following labs and images: CBC: Recent Labs  Lab 02/17/24 0400 02/18/24 0320 02/18/24 0521 02/19/24 0500 02/20/24 0617  WBC 12.2* 13.2* 16.9* 8.9 10.7*  HGB 11.3* 11.8* 12.8* 11.5* 11.4*  HCT 35.9* 39.2 40.5 36.4* 35.8*  MCV 89.1 95.4 88.0 87.5 87.7  PLT 281 269 311 242 220   BMP &GFR Recent Labs  Lab 02/16/24 0440 02/17/24 0400 02/18/24 0320 02/18/24 0521 02/19/24 0500 02/20/24 0617  NA 143 143 132* 139 141  --   K 3.7 4.1 6.7* 4.2 4.2  --   CL 104 104 98 102 103  --   CO2 27 27 22 23 26   --   GLUCOSE 109* 131* 767* 111* 135*  --   BUN 73* 73* 69* 74* 80*  --   CREATININE 2.00* 2.06* 1.91* 2.18* 2.20*  --   CALCIUM  9.4 9.7 8.3* 9.5 9.5  --   MG 2.0 1.9 2.4 2.4 2.5* 2.4  PHOS 4.3 4.1 6.0* 4.1 4.0  --    Estimated Creatinine Clearance: 31.1 mL/min (A) (by C-G formula based on SCr of 2.2 mg/dL (H)). Liver & Pancreas: Recent Labs  Lab 02/15/24 0601 02/16/24 0440 02/17/24 0400 02/18/24 0320 02/18/24 0521 02/19/24 0500  AST 15  --   --   --   --  12*  ALT 19  --   --   --   --  15  ALKPHOS 95  --   --   --   --  72  BILITOT 0.5  --   --   --   --  0.5  PROT 7.1  --   --   --   --  7.1  ALBUMIN  1.8* 1.8* 2.0* 1.8* 2.1* 1.9*   No results for input(s): LIPASE, AMYLASE in the last 168 hours. No results for input(s): AMMONIA in  the last 168 hours. Diabetic: No results for input(s): HGBA1C in the last 72 hours. No results for input(s): GLUCAP in the last 168 hours.  Cardiac Enzymes: No results for input(s): CKTOTAL, CKMB, CKMBINDEX, TROPONINI in the last 168 hours. No results for input(s): PROBNP in the last 8760 hours. Coagulation Profile: No results for input(s): INR, PROTIME in the last 168 hours. Thyroid  Function Tests: No results for input(s): TSH, T4TOTAL, FREET4, T3FREE, THYROIDAB in the last 72 hours. Lipid Profile: Recent Labs    02/19/24 0500  TRIG 56    Anemia Panel: No results for input(s): VITAMINB12, FOLATE, FERRITIN, TIBC, IRON , RETICCTPCT in the last 72 hours. Urine analysis:    Component Value Date/Time   COLORURINE YELLOW 07/04/2023 1141   APPEARANCEUR HAZY (A) 07/04/2023 1141   LABSPEC 1.010 07/04/2023 1141   PHURINE 5.0 07/04/2023 1141   GLUCOSEU NEGATIVE 07/04/2023 1141   HGBUR NEGATIVE 07/04/2023 1141   BILIRUBINUR NEGATIVE 07/04/2023 1141   KETONESUR NEGATIVE 07/04/2023 1141   PROTEINUR NEGATIVE 07/04/2023 1141   UROBILINOGEN 0.2 08/31/2019 1136   NITRITE NEGATIVE 07/04/2023 1141   LEUKOCYTESUR NEGATIVE 07/04/2023 1141   Sepsis Labs: Invalid input(s): PROCALCITONIN, LACTICIDVEN  Microbiology: No results found for this or any previous visit (from the past 240 hours).  Radiology Studies: No results found.    Jshawn Hurta T. Sharaya Boruff [facility]  If 7PM-7AM, please contact night-coverage [email or web address] 02/20/2024, 12:14 PM

## 2024-02-21 DIAGNOSIS — K223 Perforation of esophagus: Secondary | ICD-10-CM | POA: Diagnosis not present

## 2024-02-21 DIAGNOSIS — E43 Unspecified severe protein-calorie malnutrition: Secondary | ICD-10-CM | POA: Diagnosis not present

## 2024-02-21 DIAGNOSIS — I5033 Acute on chronic diastolic (congestive) heart failure: Secondary | ICD-10-CM | POA: Diagnosis not present

## 2024-02-21 DIAGNOSIS — R110 Nausea: Secondary | ICD-10-CM | POA: Diagnosis not present

## 2024-02-21 LAB — RENAL FUNCTION PANEL
Albumin: 2 g/dL — ABNORMAL LOW (ref 3.5–5.0)
Anion gap: 15 (ref 5–15)
BUN: 79 mg/dL — ABNORMAL HIGH (ref 8–23)
CO2: 25 mmol/L (ref 22–32)
Calcium: 9.5 mg/dL (ref 8.9–10.3)
Chloride: 103 mmol/L (ref 98–111)
Creatinine, Ser: 2.01 mg/dL — ABNORMAL HIGH (ref 0.61–1.24)
GFR, Estimated: 35 mL/min — ABNORMAL LOW (ref >60)
Glucose, Bld: 145 mg/dL — ABNORMAL HIGH (ref 70–99)
Phosphorus: 3.1 mg/dL (ref 2.5–4.6)
Potassium: 4.2 mmol/L (ref 3.5–5.1)
Sodium: 143 mmol/L (ref 135–145)

## 2024-02-21 LAB — GLUCOSE, CAPILLARY
Glucose-Capillary: 141 mg/dL — ABNORMAL HIGH (ref 70–99)
Glucose-Capillary: 164 mg/dL — ABNORMAL HIGH (ref 70–99)
Glucose-Capillary: 145 mg/dL — ABNORMAL HIGH (ref 70–99)
Glucose-Capillary: 140 mg/dL — ABNORMAL HIGH (ref 70–99)
Glucose-Capillary: 134 mg/dL — ABNORMAL HIGH (ref 70–99)

## 2024-02-21 LAB — MAGNESIUM: Magnesium: 2.1 mg/dL (ref 1.7–2.4)

## 2024-02-21 MED ORDER — SACUBITRIL-VALSARTAN 97-103 MG PO TABS
1.0000 | ORAL_TABLET | Freq: Two times a day (BID) | ORAL | Status: DC
  Administered 2024-02-21 – 2024-02-23 (×5): 1
  Filled 2024-02-21 (×5): qty 1

## 2024-02-21 MED ORDER — INSULIN ASPART 100 UNIT/ML IJ SOLN
0.0000 [IU] | INTRAMUSCULAR | Status: DC
  Administered 2024-02-21: 1 [IU] via SUBCUTANEOUS
  Administered 2024-02-21: 2 [IU] via SUBCUTANEOUS
  Administered 2024-02-21 – 2024-02-22 (×7): 1 [IU] via SUBCUTANEOUS
  Administered 2024-02-23 (×2): 2 [IU] via SUBCUTANEOUS
  Administered 2024-02-23 – 2024-02-25 (×8): 1 [IU] via SUBCUTANEOUS
  Administered 2024-02-25: 2 [IU] via SUBCUTANEOUS
  Administered 2024-02-25 – 2024-02-26 (×2): 1 [IU] via SUBCUTANEOUS
  Administered 2024-02-26: 2 [IU] via SUBCUTANEOUS
  Administered 2024-02-26 – 2024-02-27 (×5): 1 [IU] via SUBCUTANEOUS
  Administered 2024-02-27: 2 [IU] via SUBCUTANEOUS
  Administered 2024-02-27 – 2024-02-28 (×3): 1 [IU] via SUBCUTANEOUS
  Administered 2024-02-28: 2 [IU] via SUBCUTANEOUS
  Administered 2024-02-28 – 2024-02-29 (×5): 1 [IU] via SUBCUTANEOUS
  Administered 2024-03-01: 2 [IU] via SUBCUTANEOUS
  Administered 2024-03-01 – 2024-03-12 (×26): 1 [IU] via SUBCUTANEOUS
  Administered 2024-03-13: 3 [IU] via SUBCUTANEOUS
  Administered 2024-03-13: 1 [IU] via SUBCUTANEOUS
  Administered 2024-03-14: 2 [IU] via SUBCUTANEOUS
  Administered 2024-03-14 (×3): 1 [IU] via SUBCUTANEOUS
  Administered 2024-03-14: 2 [IU] via SUBCUTANEOUS
  Administered 2024-03-15 – 2024-03-18 (×10): 1 [IU] via SUBCUTANEOUS
  Filled 2024-02-21 (×8): qty 1
  Filled 2024-02-21: qty 3
  Filled 2024-02-21 (×4): qty 2
  Filled 2024-02-21 (×2): qty 1
  Filled 2024-02-21: qty 3
  Filled 2024-02-21 (×2): qty 1
  Filled 2024-02-21: qty 2
  Filled 2024-02-21: qty 1
  Filled 2024-02-21: qty 2
  Filled 2024-02-21 (×17): qty 1
  Filled 2024-02-21: qty 2
  Filled 2024-02-21 (×4): qty 1
  Filled 2024-02-21: qty 2
  Filled 2024-02-21 (×9): qty 1
  Filled 2024-02-21: qty 2
  Filled 2024-02-21 (×5): qty 1
  Filled 2024-02-21: qty 2
  Filled 2024-02-21 (×23): qty 1

## 2024-02-21 MED ORDER — TRACE MINERALS CU-MN-SE-ZN 300-55-60-3000 MCG/ML IV SOLN
INTRAVENOUS | Status: AC
  Filled 2024-02-21: qty 772.8

## 2024-02-21 MED ORDER — KATE FARMS STANDARD 1.4 EN LIQD
1000.0000 mL | ENTERAL | Status: DC
  Administered 2024-02-22: 1000 mL
  Filled 2024-02-21 (×4): qty 1000

## 2024-02-21 MED ORDER — TORSEMIDE 20 MG PO TABS
60.0000 mg | ORAL_TABLET | Freq: Every day | ORAL | Status: DC
  Administered 2024-02-21 – 2024-02-22 (×2): 60 mg
  Filled 2024-02-21: qty 3

## 2024-02-21 MED ORDER — METOCLOPRAMIDE HCL 5 MG/ML IJ SOLN
5.0000 mg | Freq: Three times a day (TID) | INTRAMUSCULAR | Status: DC | PRN
  Administered 2024-02-21 – 2024-02-22 (×3): 5 mg via INTRAVENOUS
  Filled 2024-02-21 (×3): qty 2

## 2024-02-21 MED ORDER — LOPERAMIDE HCL 1 MG/7.5ML PO SUSP
4.0000 mg | Freq: Three times a day (TID) | ORAL | Status: DC | PRN
  Administered 2024-02-21 – 2024-02-22 (×2): 4 mg
  Filled 2024-02-21 (×3): qty 30

## 2024-02-21 MED ORDER — SACCHAROMYCES BOULARDII 250 MG PO CAPS: 250.0000 mg | ORAL_CAPSULE | Freq: Two times a day (BID) | ORAL | Status: DC

## 2024-02-21 NOTE — Progress Notes (Signed)
 PROGRESS NOTE  Billy [name] FMW:[id number] DOB: 25-Jun-1950   PCP: Seabron Lenis, MD  Patient is from: Home.  DOA: 01/21/2024 LOS: 31   Brief Narrative / Interim history: 73 year old M with PMH of cardiac arrest earlier this year along with HFrEF, Afib, PE presented with several days of nausea and vomiting from a stomach bug followed by sudden onset severe tearing epigastric pain radiating to back and chest. Workup revealed esophageal tear/Boerhaave. Patient had esophageal stent placed on 10/7.  Subsequent swallow study revealed an additional leak, and he had stent repositioned on 01/31/2024.  He became hypoxic with gastric contents noted on intubation, and he was transferred to ICU for mechanical ventilation.  Bedside ultrasound with right pleural effusion for which he had chest tube placed with removal of 300 cc exudative fluid.  He was also successfully extubated on 10/16.  Patient has had increased oxygen  requirement requiring reintubation on 10/20.  Bedside ultrasound showed left-sided pleural effusion and he had left-sided chest tube with 120 cc exudative output. ICU stay complicated by high O2 requirement, left sided pleural effusion, chest tube placement 10/16, reintubated 10/20.  Eventually, patient was extubated and transferred out of ICU on 10/28. Hospital course complicated by acute on chronic HFpEF for which he is on IV Lasix  per advanced heart failure team. PEG tube placed on 11/3.  Dietitian consulted for initiation of tube feed.  Remains on TPN until tube feed at goal.  Subjective: Patient complains of nausea.  Denies any abdominal pain.  Has been having loose stools.     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  RD consulted for initiation of TF -Continue TPN until TF at  goal -Bilateral chest tube in place.  CTS managing -On IV Zosyn  per CTS.  Acute hypoxic/ hypercapnic respiratory failure- Multifactorial including aspiration pneumonia, pleural effusion and CHF.  Trach aspirate on 10/15 with Candida albicans. -IV meropenem>> IV Zosyn  for pneumonia per CTS. -Diflucan for Candida albicans from trach aspirate from 10/15-11/4. -Chest tube for pleural effusion per CTS -IV Lasix  per advanced heart failure team -Continue bronchodilators   Acute on chronic diastolic CHF Limited TTE on 10/23 with LVEF of 60 to 65%.  RHC in 3/25 with mild PAH with severe RV failure but preserved CO.  Appears euvolemic on exam. -Diuretics per advanced heart failure team.  Transitioning to p.o. now he has PEG tube. -Monitor fluid and respiratory status, renal functions, electrolytes Patient noted to be on torsemide .  Not on any other GDMT at this time.   AKI on CKD-3B:  baseline Cr~2.0.  AKI seems to have resolved. -Continue monitoring   Paroxysmal A-fib:  s/p TEE DCCV 3/25.  Seems to be in sinus rhythm. -Transitioned to p.o. amiodarone  and Eliquis  -Optimize electrolyte   Intractable pain-   Required ketamine and started on IV Dilaudid  PCA while in ICU. -Start oxycodone  per PEG tube -Decreasing PCA with a goal to wean off or changed to as needed Dilaudid .  H/o PE/ DVT-  s/p infrarenal IVC placement 3/25. V/Q 3/25 with multiple perfusion defects - Now on Eliquis .  Essential hypertension:  BP within acceptable range. -Pain control as above -Diuretics as above. -IV hydralazine  as needed  Nausea and diarrhea Abdomen noted to be benign on examination.  No distention noted.  Some of the symptoms could be due to newly initiated tube feedings.  He has received ondansetron  without any relief of his nausea.  Could give him a trial of metoclopramide. Imodium as needed.  Unwitnessed fall in the hospital:  Found down on the floor the evening of 10/31.  No apparent injury or  focal neurodeficit. - Fall precaution  Anxiety disorder: Stable. -Ativan  as needed   Acute on chronic anemia -Monitor H&H.   Leukocytosis: Resolved.  GERD - IV PPI.  Goal of care: Noted DNR paperwork in [facility] but patient confirms full CODE STATUS.SABRA   Severe protein calorie malnutrition/hypoalbuminemia Body mass index is 20.77 kg/m. Nutrition Problem: Severe Malnutrition Etiology: acute illness (may have chronic component due to time frame of reported wt loss) Signs/Symptoms: severe fat depletion, moderate muscle depletion Interventions: Refer to RD note for recommendations  Pressure skin injury Wound 02/07/24 0900 Pressure Injury Foot Anterior;Right Stage 1 -  Intact skin with non-blanchable redness of a localized area usually over a bony prominence. (Active)     Wound 02/07/24 0900 Pressure Injury Foot Anterior;Left Stage 1 -  Intact skin with non-blanchable redness of a localized area usually over a bony prominence. (Active)     Wound 02/07/24 0900 Pressure Injury Buttocks Right Stage 1 -  Intact skin with non-blanchable redness of a localized area usually over a bony prominence. (Active)   DVT prophylaxis: On Eliquis  now Code Status: Full code Family Communication: None at bedside today. Disposition: To be determined  Consultants:  Gastroenterology Critical care Cardiology Cardiothoracic surgery  Procedures: See above.  Microbiology summarized: 10/5-MRSA PCR screen positive 10/15-trach aspirate culture with Candida albicans.  Objective: Vitals:   02/21/24 0417 02/21/24 0421 02/21/24 0821 02/21/24 0942  BP:  (!) 167/76 (!) 147/77   Pulse:  66 72   Resp: 11 16 15 17   Temp:  97.6 F (36.4 C) 97.7 F (36.5 C)   TempSrc:  Oral Oral   SpO2:  97% 97%   Weight:      Height:        Examination:  General appearance: Awake alert.  In no distress Resp: Clear to auscultation bilaterally.  Normal effort Chest tubes noted Cardio: S1-S2 is normal regular.  No  S3-S4.  No rubs murmurs or bruit GI: Abdomen is soft.  Nontender nondistended.  Bowel sounds are present normal.  No masses organomegaly.  PEG tube noted Extremities: No edema.  Full range of motion of lower extremities. Neurologic: Alert and oriented x3.  No focal neurological deficits.   Sch Meds:  Scheduled Meds:  amiodarone   200 mg Per Tube BID   apixaban   5 mg Per Tube BID   bisacodyl   10 mg Rectal Q0600   Chlorhexidine  Gluconate Cloth  6 each Topical Daily   fiber supplement (BANATROL TF)  60 mL Per Tube BID   [name] butt cream   Topical BID   HYDROmorphone    Intravenous Q4H   insulin  aspart  0-9 Units Subcutaneous Q4H   lidocaine   1 patch Transdermal Q24H   pantoprazole  (PROTONIX ) IV  40 mg Intravenous Q12H   sodium chloride  flush  10 mL Intrapleural Q8H   sodium chloride  flush  10-40 mL Intracatheter Q12H   torsemide   60 mg Oral Daily   Continuous Infusions:  feeding supplement ([place] STANDARD ENT 1.4) 65 mL/hr at 02/21/24 0941   piperacillin -tazobactam (ZOSYN )  IV 3.375 g (02/21/24 0605)   TPN ADULT (ION) 70 mL/hr at 02/21/24 0500   TPN ADULT (ION)     PRN Meds:.acetaminophen  (TYLENOL ) oral liquid 160 mg/5 mL, diclofenac Sodium,  diphenhydrAMINE **OR** diphenhydrAMINE, hydrALAZINE , levalbuterol, lip balm, naloxone  **AND** sodium chloride  flush, ondansetron  (ZOFRAN ) IV, mouth rinse, oxyCODONE , sodium chloride  flush  Antimicrobials: Anti-infectives (From admission, onward)    Start     Dose/Rate Route Frequency Ordered Stop   02/13/24 0900  piperacillin -tazobactam (ZOSYN ) IVPB 3.375 g        3.375 g 12.5 mL/hr over 240 Minutes Intravenous Every 8 hours 02/13/24 0812     02/07/24 0800  vancomycin (VANCOCIN) IVPB 1000 mg/200 mL premix  Status:  Discontinued        1,000 mg 200 mL/hr over 60 Minutes Intravenous Every 24 hours 02/07/24 0632 02/09/24 0905   02/06/24 2200  meropenem (MERREM) 1 g in sodium chloride  0.9 % 100 mL IVPB  Status:  Discontinued        1  g 200 mL/hr over 30 Minutes Intravenous Every 12 hours 02/06/24 1127 02/13/24 0812   02/06/24 1300  vancomycin (VANCOREADY) IVPB 1750 mg/350 mL  Status:  Discontinued        1,750 mg 175 mL/hr over 120 Minutes Intravenous Every 48 hours 02/04/24 1225 02/04/24 1226   02/06/24 0800  vancomycin (VANCOCIN) IVPB 1000 mg/200 mL premix        1,000 mg 200 mL/hr over 60 Minutes Intravenous  Once 02/06/24 0629 02/07/24 0829   02/04/24 1400  meropenem (MERREM) 1 g in sodium chloride  0.9 % 100 mL IVPB  Status:  Discontinued        1 g 200 mL/hr over 30 Minutes Intravenous Every 8 hours 02/04/24 1208 02/06/24 1127   02/04/24 1300  linezolid  (ZYVOX ) IVPB 600 mg  Status:  Discontinued        600 mg 300 mL/hr over 60 Minutes Intravenous Every 12 hours 02/04/24 1202 02/04/24 1208   02/04/24 1300  vancomycin (VANCOREADY) IVPB 2000 mg/400 mL        2,000 mg 200 mL/hr over 120 Minutes Intravenous  Once 02/04/24 1208 02/04/24 1623   02/04/24 1208  vancomycin variable dose per unstable renal function (pharmacist dosing)  Status:  Discontinued         Does not apply See admin instructions 02/04/24 1208 02/07/24 0632   01/31/24 1030  fluconazole (DIFLUCAN) IVPB 200 mg  Status:  Discontinued        200 mg 100 mL/hr over 60 Minutes Intravenous Daily 01/31/24 0938 02/20/24 1218   01/30/24 1445  piperacillin -tazobactam (ZOSYN ) IVPB 3.375 g  Status:  Discontinued        3.375 g 12.5 mL/hr over 240 Minutes Intravenous Every 8 hours 01/30/24 1354 02/04/24 1203   01/26/24 1000  amoxicillin-clavulanate (AUGMENTIN) 400-57 MG/5ML suspension 875 mg  Status:  Discontinued        875 mg Oral Every 12 hours 01/26/24 0658 01/30/24 1354   01/26/24 1000  fluconazole (DIFLUCAN) 40 MG/ML suspension 200 mg  Status:  Discontinued        200 mg Oral Daily 01/26/24 0658 01/31/24 0938   01/22/24 1445  fluconazole (DIFLUCAN) IVPB 400 mg  Status:  Discontinued        400 mg 100 mL/hr over 120 Minutes Intravenous Every 24 hours  01/22/24 1436 01/22/24 1439   01/22/24 1445  fluconazole (DIFLUCAN) IVPB 200 mg  Status:  Discontinued        200 mg 100 mL/hr over 60 Minutes Intravenous Every 24 hours 01/22/24 1439 01/26/24 0658   01/21/24 2200  piperacillin -tazobactam (ZOSYN ) IVPB 3.375 g  Status:  Discontinued       Placed in  Followed by Linked Group   3.375 g 12.5 mL/hr over 240 Minutes Intravenous Every 8 hours 01/21/24 1607 01/26/24 0658   01/21/24 1615  piperacillin -tazobactam (ZOSYN ) IVPB 3.375 g       Placed in Followed by Linked Group   3.375 g 100 mL/hr over 30 Minutes Intravenous  Once 01/21/24 1607 01/21/24 1651        CBC: Recent Labs  Lab 02/17/24 0400 02/18/24 0320 02/18/24 0521 02/19/24 0500 02/20/24 0617  WBC 12.2* 13.2* 16.9* 8.9 10.7*  HGB 11.3* 11.8* 12.8* 11.5* 11.4*  HCT 35.9* 39.2 40.5 36.4* 35.8*  MCV 89.1 95.4 88.0 87.5 87.7  PLT 281 269 311 242 220   BMP &GFR Recent Labs  Lab 02/17/24 0400 02/18/24 0320 02/18/24 0521 02/19/24 0500 02/20/24 0617 02/21/24 0632 02/21/24 0633  NA 143 132* 139 141  --   --  143  K 4.1 6.7* 4.2 4.2  --   --  4.2  CL 104 98 102 103  --   --  103  CO2 27 22 23 26   --   --  25  GLUCOSE 131* 767* 111* 135*  --   --  145*  BUN 73* 69* 74* 80*  --   --  79*  CREATININE 2.06* 1.91* 2.18* 2.20*  --   --  2.01*  CALCIUM  9.7 8.3* 9.5 9.5  --   --  9.5  MG 1.9 2.4 2.4 2.5* 2.4 2.1  --   PHOS 4.1 6.0* 4.1 4.0  --   --  3.1   Estimated Creatinine Clearance: 34 mL/min (A) (by C-G formula based on SCr of 2.01 mg/dL (H)). Liver & Pancreas: Recent Labs  Lab 02/15/24 0601 02/16/24 0440 02/17/24 0400 02/18/24 0320 02/18/24 0521 02/19/24 0500 02/21/24 0633  AST 15  --   --   --   --  12*  --   ALT 19  --   --   --   --  15  --   ALKPHOS 95  --   --   --   --  72  --   BILITOT 0.5  --   --   --   --  0.5  --   PROT 7.1  --   --   --   --  7.1  --   ALBUMIN  1.8*   < > 2.0* 1.8* 2.1* 1.9* 2.0*   < > = values in this interval not displayed.     Recent Labs  Lab 02/20/24 1650 02/20/24 2307 02/21/24 0629 02/21/24 0820  GLUCAP 142* 154* 141* 164*    Lipid Profile: Recent Labs    02/19/24 0500  TRIG 56    Microbiology: No results found for this or any previous visit (from the past 240 hours).   Radiology Studies: No results found.  Christop Hippert 02/21/2024  If 7PM-7AM, please contact night-coverage [email or web address] 02/21/2024, 9:56 AM

## 2024-02-21 NOTE — Progress Notes (Addendum)
 Nutrition Follow-up  DOCUMENTATION CODES:   Severe malnutrition in context of acute illness/injury  INTERVENTION:   Continue TPN to meet 100% estimated nutritional needs until TF can be initiated and titrated up to goal rate w/ patient tolerating -Continue MVI w/ trace elements added to TPN   Modify TF via PEG: [place] 1.4 at 65 ml/h (1560 ml per day) Continue to titrate up by 10ml/hr q12h until new goal rate achieved -Prosource TF20 60ml daily -Recommend low volume FWF when TF initiated - Add MVI w/ minerals when TPN stopped             - Banatrol BID d/t diarrhea-provides 45kcal, 5g soluble fiber and 2g protein per serving. -Provides 2264 kcal, 117 gm protein, 1108 ml free water  daily    NUTRITION DIAGNOSIS:  Severe Malnutrition related to acute illness (may have chronic component due to time frame of reported wt loss) as evidenced by severe fat depletion, moderate muscle depletion. - remains applicable  GOAL:  Patient will meet greater than or equal to 90% of their needs - met via TF + TPN  MONITOR:  Diet advancement, Labs, Weight trends, I & O's, Skin (TPN)  REASON FOR ASSESSMENT:  Consult Enteral/tube feeding initiation and management  ASSESSMENT:   73 yo male admitted post several days of N/V followed by sudden onset of severe epigastric pain radiating to chest and back. Pt found to have small esophageal perforation above GE junction. PMH includes CAD, chronic HFpEF, HTN, HLD, DVT, CVA, PAF, CKD 3b.  10/05 Admitted 10/06 Esophagogram/barium study: distal esophageal perforation, TPN started 10/07 S/p Esophageal stent, TPN at goal rate of 80 ml/hr 10/08  CLD 10/10 Diet advanced to Dysphagia 3, Calorie Count 10/13 No meal tickets for calorie count, pt reports eating bites only 10/15 Esophageal stent migration with leak on esophagram with return to OR for stent repositioning, likely aspiration event requiring intubation 10/16 Extubated, Chest tube placed 10/17  Limited echo with EF 50-55%, RV mildly reduced 10/20 Intubated early AM, L pleural effusion, pigtail chest tube inserted 10/23 - extubated; limited echo: 60-65% 10/24 - esophagram: persistent leak 10/28 - transfer out of ICU 11/03 - PEG tube placement  11/04 - TF started  Tube feedings started yesterday. Noted that pharmacy with no [place] 1.5 Peptide in stock. [place] 1.4 being administered upon presentation to bedside. Also was not titrated up overnight and remains at 20ml/hr. RN aware continuing titration orders. Will assess patient tolerance and modify, if needed. Re-calculated goal rate to provide adequate calories and protein with less concentrated formula.  RN reports some loose stools, some of which are mucous-appearing, per night shift. Notably, loose stools were present prior to initiation of tube feedings. Will continue to monitor tolerance and transition to semi-elemental formula, if indicated. Discussed initiation of probiotic with attending, who is amicable however pharmacy recommends against this d/t risk of fungemia.   Gut has not been used in 31 days and likely longer than that as he was with excessive vomiting PTA. Given lack of gut stimulation for prolonged period of time, some discomfort is expected as gut is stimulated again.   Met with patient at bedside this morning. Discussed this with patient as well as the low rate of feeding he is receiving (73ml/hr). He verbalizes understanding but continues to state that something needs to be figured out. Also discussed current interventions to aid in bulking stool and slow gastric emptying as well as other factors that are likely contributing.  Admit Weight: 72.8 kg Current Weight: 71.4 kg   No edema on exam. Weight stable compared to admission weight, up from 68 kg in last few days, which have been his lowest weights this admission.    Corrected calcium  remains elevated. BUN/Crt trending down/stable. Remains on IV Lasix   at 120mg  daily, UOP 3.0L in 24 hours.       Intake/Output Summary (Last 24 hours) at 02/21/2024 1405 Last data filed at 02/21/2024 1214 Gross per 24 hour  Intake 2175.43 ml  Output 3264 ml  Net -1088.57 ml      Drains/Lines: R brachial: PICC, triple lumen R pleural chest tube: 11 ml x 24 hours L pleural chest tube: 8 ml x24 hours UOP: 3000 ml x24 hours   Labs: Sodium 143 (wdl) Chloride 103 (wdl) Potassium 4.2 (wdl) Corr Ca 11.1 (H) BUN 79 Creatinine 2.01 (H) Albumin  2.0 Magnesium  2.1 (wdl) Phosphorus 3.1(wdl) WBC 10.7 (H) CBGs 135-145 x24 hours A1c 6.0 (01/2024)  IV heparin  and amiodarone  transitioned to PO now that PEG placed and tube feedings started. Would recommend d/c of dulcolax suppository as bowels are moving.    Meds: Bisacodyl  suppository - not being administered; rec d/c Dilaudid  SS Novolog  Pantoprazole  Torsemide  IV ABX  Diet Order:   Diet Order             Diet NPO time specified  Diet effective now                   EDUCATION NEEDS:   Education needs have been addressed  Skin:  Skin Assessment: Skin Integrity Issues: Skin Integrity Issues:: Stage I Stage I: buttocks, bilateral feet  Last BM:  11/05- type 5/7 x2  Height:  Ht Readings from Last 1 Encounters:  02/07/24 6' 1 (1.854 m)   Weight:  Wt Readings from Last 1 Encounters:  02/20/24 71.4 kg   Ideal Body Weight:  83.6 kg  BMI:  Body mass index is 20.77 kg/m.  Estimated Nutritional Needs:   Kcal:  2300-2500 kcals  Protein:  110-130 g  Fluid:  2L  Blair Deaner MS, RD, LDN Registered Dietitian Clinical Nutrition RD Inpatient Contact Info in Amion

## 2024-02-21 NOTE — Progress Notes (Signed)
 PHARMACY - TOTAL PARENTERAL NUTRITION CONSULT NOTE   Indication: intolerance to enteral feedings, esophageal perforation  Patient Measurements: Height: 6' 1 (185.4 cm) Weight: 71.4 kg (157 lb 6.5 oz) IBW/kg (Calculated) : 79.9 TPN AdjBW (KG): 80.6 Body mass index is 20.77 kg/m.  Assessment:  73 year old man with medical history significant for cardiac arrest earlier this year who was admitted after several days of nausea and vomiting followed by sudden onset severe tearing epigastric pain radiating to back and chest. 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 Pharmacy consulted to initiate TPN due to inability to tolerate enteral nutrition at this time.  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 PTA takes torsemide  20 mg PO daily, now held. Aggressive fluid resuscitation recommended per GI. Per HF, patient currently euvolemic but will hold diuretic for now. Due to CHF will target lower end of fluid range for TPN and supplement additional fluids per MD outside of TPN to prevent fluid overload.   Stent placed on 10/6, esophogram not showing any leak. Diet advanced to CLD. 10/15 Leak present and made NPO. 10/24 esophagram with persistent leak; will need stent replacement in 3-4 weeks.   10/28 AM update: patient has weights that are significantly more than admission weight of 72.7 kg on 10/5. This may represent false weights from bed scale.  Current weight is 71.6 kg  11/2 initial labs spurious  Glucose / Insulin : A1C 6.0% (02/2023);  BG <150, 13 units insulin  in TPN  Dexamethasone  10 mg given 11/3 Electrolytes: Na 143, K 4.2, Cl 103, CO2 25, Ca 9.5 [CoCa 11.1], Phos 3.1, Mg 2.1 Renal: SCr 2.01 (BL ~2), BUN 79 Hepatic: AST 12 /ALT 15 /tbili 0.5 /alk phos 72, alb 2, TG 56 Intake / Output; MIVF: furosemide  120 mg IV daily starting 11/1 >> 11/4; UOP 1.75 mL/kg/hr, chest tube 19 ml, LBM 11/5  Net IO Since Admission: 4,091.04 mL [02/21/24  0802]  GI Imaging: 10/05 CT: concerning for esophageal perforation, masslike area of soft tissue thickening adjacent to and surrounding the esophagus  10/06 Esoph XR: distal esophageal perforation 10/7 Esoph XR: No leak associated with stent coverage of esophageal perforation 10/14 KUB: Gas-filled loops of small bowel and colon suggestive of ileus 10/15 Esoph XR: esophageal perforation/leak at the distal esophagus, proximal to the esophageal stent 10/24 esophagram: persistent leak despite being covered by the esophageal stent.  GI Surgeries / Procedures:  10/6: esophageal stent placement  10/15 EGD and stent repositioning   Central access: 01/22/24 TPN start date: 01/22/24  Nutritional Goals:  Goal concentrated TPN is 70 ml/hr (provides 116 g of protein and 2301 kcals per day  RD Assessment:  Estimated Needs Total Energy Estimated Needs: 2300-2500 kcals Total Protein Estimated Needs: 110-130 g Total Fluid Estimated Needs: 2L  Current Nutrition:  TPN/NPO  10/13- 10/14 DYS1: not taking any po  10/15: NPO d/t increased abd distension  10/22 Propofol  at 7.6- 20.2 ml/hr, providing ~370 kcals/24hr- OFF 10/23: switch propofol  back to dexmedetomidine - OFF  Plan:  Continue concentrated TPN at goal 70 mL/hr, providing 100% estimated nutritional needs   Electrolytes in TPN: Na 14 mEq/L, K 60 mEq/L, Ca 0 mEq/L, Mg 1 mEq/L, Phos 12 mmol/L. Cl:Ac max acetate Continue standard MVI and trace elements to TPN Continue 13 units insulin  regular to TPN Monitor TPN labs on Mon/Thurs, and PRN   Thank you for involving pharmacy in this patient's care.   Benedetta Heath BS, PharmD, BCPS Clinical Pharmacist 02/21/2024 6:45 AM  Contact: [phone] after 3 PM

## 2024-02-21 NOTE — Plan of Care (Signed)
  Problem: Clinical Measurements: Goal: Will remain free from infection Outcome: Progressing Goal: Respiratory complications will improve Outcome: Progressing   Problem: Nutrition: Goal: Adequate nutrition will be maintained Outcome: Progressing   Problem: Elimination: Goal: Will not experience complications related to urinary retention Outcome: Progressing   Problem: Pain Managment: Goal: General experience of comfort will improve and/or be controlled Outcome: Progressing

## 2024-02-21 NOTE — Progress Notes (Signed)
 Advanced Heart Failure Rounding Note  Cardiologist: Dorn Lesches, MD  Chief Complaint: S/p esophageal stent Subjective:    - 10/6: esoph stent placement - 10/15: S/p EGD with stent repositioning by Dr. Shyrl  - 10/23: Ltd echo EF 60-65%  - 10/24: Barium swallow showed persistent leak  - 11/3: PEG tube placed  Remains in NSR. HTNive, SBP 190s. Started on TF. No weight today. On lasix  120 IV daily.  Lying in bed. Pain controlled on PCA. No SOB.   Objective:    Weight Range: 71.4 kg Body mass index is 20.77 kg/m.   Vital Signs:   Temp:  [97.6 F (36.4 C)-98 F (36.7 C)] 97.7 F (36.5 C) (11/05 1214) Pulse Rate:  [66-73] 73 (11/05 1214) Resp:  [11-19] 15 (11/05 1214) BP: (147-197)/(76-85) 197/85 (11/05 1214) SpO2:  [97 %-98 %] 98 % (11/05 1214) Last BM Date : 02/21/24  Weight change: Filed Weights   02/17/24 0203 02/19/24 0442 02/20/24 0500  Weight: 68.8 kg 68.6 kg 71.4 kg   Intake/Output:  Intake/Output Summary (Last 24 hours) at 02/21/2024 1227 Last data filed at 02/21/2024 1214 Gross per 24 hour  Intake 2175.43 ml  Output 3269 ml  Net -1093.57 ml    Physical Exam   General: Elderly, pale appearing. No distress on RA Cardiac: JVP flat. S1 and S2 present. No murmurs  Extremities: Warm and diaphoretic.  No edema.  Neuro: Alert and oriented x3. Affect flat  Telemetry   SR 70s (personally reviewed)  Labs    CBC Recent Labs    02/19/24 0500 02/20/24 0617  WBC 8.9 10.7*  HGB 11.5* 11.4*  HCT 36.4* 35.8*  MCV 87.5 87.7  PLT 242 220   Basic Metabolic Panel Recent Labs    88/96/74 0500 02/20/24 0617 02/21/24 0632 02/21/24 0633  NA 141  --   --  143  K 4.2  --   --  4.2  CL 103  --   --  103  CO2 26  --   --  25  GLUCOSE 135*  --   --  145*  BUN 80*  --   --  79*  CREATININE 2.20*  --   --  2.01*  CALCIUM  9.5  --   --  9.5  MG 2.5* 2.4 2.1  --   PHOS 4.0  --   --  3.1   Liver Function Tests Recent Labs    02/19/24 0500  02/21/24 0633  AST 12*  --   ALT 15  --   ALKPHOS 72  --   BILITOT 0.5  --   PROT 7.1  --   ALBUMIN  1.9* 2.0*   BNP (last 3 results) Recent Labs    03/09/23 1544 03/11/23 0218 06/27/23 1116  BNP 660.9* 491.3* 646.4*   Hemoglobin A1C No results for input(s): HGBA1C in the last 72 hours.  Fasting Lipid Panel Recent Labs    02/19/24 0500  TRIG 56    Medications:    Scheduled Medications:  amiodarone   200 mg Per Tube BID   apixaban   5 mg Per Tube BID   bisacodyl   10 mg Rectal Q0600   Chlorhexidine  Gluconate Cloth  6 each Topical Daily   fiber supplement (BANATROL TF)  60 mL Per Tube BID   [name] butt cream   Topical BID   HYDROmorphone    Intravenous Q4H   insulin  aspart  0-9 Units Subcutaneous Q4H   lidocaine   1 patch Transdermal Q24H   pantoprazole  (  PROTONIX ) IV  40 mg Intravenous Q12H   sodium chloride  flush  10 mL Intrapleural Q8H   sodium chloride  flush  10-40 mL Intracatheter Q12H   torsemide   60 mg Per Tube Daily    Infusions:  feeding supplement (KATE FARMS STANDARD ENT 1.4) 65 mL/hr at 02/21/24 0941   piperacillin -tazobactam (ZOSYN )  IV 3.375 g (02/21/24 0605)   TPN ADULT (ION) 70 mL/hr at 02/21/24 0500   TPN ADULT (ION)      PRN Medications: acetaminophen  (TYLENOL ) oral liquid 160 mg/5 mL, diclofenac Sodium, diphenhydrAMINE **OR** diphenhydrAMINE, hydrALAZINE , levalbuterol, lip balm, loperamide HCl, metoCLOPramide (REGLAN) injection, naloxone  **AND** sodium chloride  flush, ondansetron  (ZOFRAN ) IV, mouth rinse, oxyCODONE , sodium chloride  flush  Patient Profile    Patient with longstanding history of chronic heart failure with preserved ejection fraction, RV failure with history of pulmonary embolism presents with esophageal perforation.  S/P Stent Esophageal Stent. Stent repositioned 10/15.  Assessment/Plan   1.  Esophageal Perforation - Boerhaave syndrome.  - CT C/A/P: with pneumomediastinum and large mass-like density in lower esophagus -  10/6: EGD with esophageal stent placed  - 10/15: Stent migrated w/ leak > back to OR for repositioning  - 10/24: barium swallow w persistent leak  - 11/3: PEG placed - started on TF; can start switching over the PO meds - Continues on fluconazole; abx escalated d/t PNA, now on Zosyn  Primary managing.  2. Acute on chronic HFpEF: h/o cardiogenic shock with RV failure in the setting of cardiac arrest thought to be caused by acute PE vs ACS in 3/25. Echo at the time showed EF 55%, severe, LVH, G2DD, and severely reduced RV function with strain. There was concern for cardiac sarcoid amyloid, however CMR LGE consistent with ischemic disease. RV on echo 9/25 with complete recovery, EF 60-65%, nl RV function.    - RHC 3/25: Mild PAH with severe RV failure though CO is preserved.   - Repeat echo 10/25 EF 60-65% - Euvolemic; continue torsemide  60 mg daily - adjust as needed - restart entresto  97/103 mg bid - Continue TED hose  3. 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 Medical management.  - suspect CP related to esophageal issues and not CAD - No s/s angina   4.  AKI on CKD Stage 3b: Baseline sCr ~ 2.1. - stable at baseline  5. PAF:   - s/p TEE/DCCV 3/25 - has converted to AF a couple times this admit - continue amio 200 mg bid - continue eliquis  5 mg bid, check hgb tomorrow am - in NSR on tele    6.  H/o DVT/PE:  Bilateral upper and lower DVTs, mixed acute and chronic.  - s/p infrarenal IVC placement 3/25 - V/Q 3/25 with multiple perfusion defects - continue eliquis    7. Carotid stenosis: h/o CVA. TCAR in 6/24. Okay to remain off plavix  at this point. - carotid US  9/25: widely patent carotid stent on R and no stenosis on L   8. Acute Hypoxic Respiratory Failure/PNA/ Exudative Lt Pleural Effusion  - on zosyn   - CT placed for pleural effusion. Resp Cx w/ rare yeast, BCx NGTD  - remains on [place] - [name] managing; ?CT removal  today  9. Severe protein calorie malnutrition - Nutrition following. On TPN  - now with PEG tube + TF  10. HTN - severe hypertension overnight and this morning SBP 190s - restart BP meds as above as above  Goddess Gebbia, NP 02/21/2024  Advanced Heart Failure Team Pager [phone] (M-F; 7a - 5p)  Please contact [facility] for night-coverage after hours (4p -7a ) and weekends on [email or web address]

## 2024-02-21 NOTE — Progress Notes (Signed)
 PT Cancellation Note  Patient Details Name: Billy [name] MRN: [id number] DOB: 05-May-1950   Cancelled Treatment:    Reason Eval/Treat Not Completed: (P) Fatigue/lethargy limiting ability to participate;Other (comment) (c/o nausea and malaise) Will continue efforts next date per PT POC as schedule permits    [name] 02/21/2024, 5:07 PM

## 2024-02-22 DIAGNOSIS — R110 Nausea: Secondary | ICD-10-CM | POA: Diagnosis not present

## 2024-02-22 DIAGNOSIS — I5033 Acute on chronic diastolic (congestive) heart failure: Secondary | ICD-10-CM | POA: Diagnosis not present

## 2024-02-22 DIAGNOSIS — K223 Perforation of esophagus: Secondary | ICD-10-CM | POA: Diagnosis not present

## 2024-02-22 DIAGNOSIS — E43 Unspecified severe protein-calorie malnutrition: Secondary | ICD-10-CM | POA: Diagnosis not present

## 2024-02-22 LAB — MAGNESIUM: Magnesium: 2.1 mg/dL (ref 1.7–2.4)

## 2024-02-22 LAB — GLUCOSE, CAPILLARY
Glucose-Capillary: 117 mg/dL — ABNORMAL HIGH (ref 70–99)
Glucose-Capillary: 118 mg/dL — ABNORMAL HIGH (ref 70–99)
Glucose-Capillary: 123 mg/dL — ABNORMAL HIGH (ref 70–99)
Glucose-Capillary: 136 mg/dL — ABNORMAL HIGH (ref 70–99)
Glucose-Capillary: 138 mg/dL — ABNORMAL HIGH (ref 70–99)
Glucose-Capillary: 143 mg/dL — ABNORMAL HIGH (ref 70–99)
Glucose-Capillary: 145 mg/dL — ABNORMAL HIGH (ref 70–99)

## 2024-02-22 LAB — COMPREHENSIVE METABOLIC PANEL WITH GFR
ALT: 15 U/L (ref 0–44)
AST: 12 U/L — ABNORMAL LOW (ref 15–41)
Albumin: 2 g/dL — ABNORMAL LOW (ref 3.5–5.0)
Alkaline Phosphatase: 70 U/L (ref 38–126)
Anion gap: 13 (ref 5–15)
BUN: 93 mg/dL — ABNORMAL HIGH (ref 8–23)
CO2: 26 mmol/L (ref 22–32)
Calcium: 9.2 mg/dL (ref 8.9–10.3)
Chloride: 103 mmol/L (ref 98–111)
Creatinine, Ser: 2.51 mg/dL — ABNORMAL HIGH (ref 0.61–1.24)
GFR, Estimated: 27 mL/min — ABNORMAL LOW (ref >60)
Glucose, Bld: 114 mg/dL — ABNORMAL HIGH (ref 70–99)
Potassium: 4.3 mmol/L (ref 3.5–5.1)
Sodium: 142 mmol/L (ref 135–145)
Total Bilirubin: 0.5 mg/dL (ref 0.0–1.2)
Total Protein: 6.8 g/dL (ref 6.5–8.1)

## 2024-02-22 LAB — CBC
HCT: 40.5 % (ref 39.0–52.0)
Hemoglobin: 12.7 g/dL — ABNORMAL LOW (ref 13.0–17.0)
MCH: 27.8 pg (ref 26.0–34.0)
MCHC: 31.4 g/dL (ref 30.0–36.0)
MCV: 88.6 fL (ref 80.0–100.0)
Platelets: 259 K/uL (ref 150–400)
RBC: 4.57 MIL/uL (ref 4.22–5.81)
RDW: 19.1 % — ABNORMAL HIGH (ref 11.5–15.5)
WBC: 12.2 K/uL — ABNORMAL HIGH (ref 4.0–10.5)
nRBC: 0 % (ref 0.0–0.2)

## 2024-02-22 LAB — PHOSPHORUS: Phosphorus: 3.5 mg/dL (ref 2.5–4.6)

## 2024-02-22 MED ORDER — FLUCONAZOLE 200 MG PO TABS
200.0000 mg | ORAL_TABLET | Freq: Every day | ORAL | Status: AC
  Administered 2024-02-22 – 2024-03-03 (×11): 200 mg
  Filled 2024-02-22 (×11): qty 1

## 2024-02-22 MED ORDER — TRACE MINERALS CU-MN-SE-ZN 300-55-60-3000 MCG/ML IV SOLN
INTRAVENOUS | Status: DC
  Filled 2024-02-22: qty 772.8

## 2024-02-22 MED ORDER — FLUCONAZOLE 200 MG PO TABS
200.0000 mg | ORAL_TABLET | Freq: Every day | ORAL | Status: DC
  Filled 2024-02-22: qty 1

## 2024-02-22 MED ORDER — TRACE MINERALS CU-MN-SE-ZN 300-55-60-3000 MCG/ML IV SOLN
INTRAVENOUS | Status: AC
  Filled 2024-02-22: qty 386.4

## 2024-02-22 MED ORDER — LOPERAMIDE HCL 1 MG/7.5ML PO SUSP
2.0000 mg | Freq: Three times a day (TID) | ORAL | Status: DC
  Administered 2024-02-22 (×2): 2 mg
  Filled 2024-02-22 (×3): qty 15

## 2024-02-22 NOTE — Progress Notes (Signed)
 Advanced Heart Failure Rounding Note  Cardiologist: Dorn Lesches, MD  Chief Complaint: S/p esophageal stent Subjective:    - 10/6: esoph stent placement - 10/15: S/p EGD with stent repositioning by Dr. Shyrl  - 10/23: Ltd echo EF 60-65%  - 10/24: Barium swallow showed persistent leak  - 11/3: PEG tube placed  BP significantly improved, creatinine bumped, but suspect due to restarting ARNI and being somewhat dry at this point. Hold further diuretics with ongoing reduction in TPN, oral meds on board.   Lying in his own stool unfortunately. Very frustrated.   Objective:    Weight Range: 66.8 kg Body mass index is 19.43 kg/m.   Vital Signs:   Temp:  [97.6 F (36.4 C)-98.2 F (36.8 C)] 98.2 F (36.8 C) (11/06 0806) Pulse Rate:  [71-75] 71 (11/06 0806) Resp:  [13-17] 17 (11/06 1233) BP: (117-159)/(56-76) 149/70 (11/06 0806) SpO2:  [92 %-98 %] 98 % (11/06 0806) Weight:  [66.8 kg] 66.8 kg (11/06 0434) Last BM Date : 02/22/24  Weight change: Filed Weights   02/19/24 0442 02/20/24 0500 02/22/24 0434  Weight: 68.6 kg 71.4 kg 66.8 kg   Intake/Output:  Intake/Output Summary (Last 24 hours) at 02/22/2024 1552 Last data filed at 02/22/2024 0845 Gross per 24 hour  Intake 1189.01 ml  Output 600 ml  Net 589.01 ml    Physical Exam   General: Elderly, pale appearing. No distress on RA Cardiac: JVP flat. S1 and S2 present. No murmurs  Extremities: Warm and diaphoretic.  No edema.  Neuro: Alert and oriented x3. Affect flat  Telemetry   SR 70s (personally reviewed)  Labs    CBC Recent Labs    02/20/24 0617 02/22/24 0441  WBC 10.7* 12.2*  HGB 11.4* 12.7*  HCT 35.8* 40.5  MCV 87.7 88.6  PLT 220 259   Basic Metabolic Panel Recent Labs    88/94/74 0632 02/21/24 0633 02/22/24 0441  NA  --  143 142  K  --  4.2 4.3  CL  --  103 103  CO2  --  25 26  GLUCOSE  --  145* 114*  BUN  --  79* 93*  CREATININE  --  2.01* 2.51*  CALCIUM   --  9.5 9.2  MG 2.1  --   2.1  PHOS  --  3.1 3.5   Liver Function Tests Recent Labs    02/21/24 0633 02/22/24 0441  AST  --  12*  ALT  --  15  ALKPHOS  --  70  BILITOT  --  0.5  PROT  --  6.8  ALBUMIN  2.0* 2.0*   BNP (last 3 results) Recent Labs    03/09/23 1544 03/11/23 0218 06/27/23 1116  BNP 660.9* 491.3* 646.4*   Hemoglobin A1C No results for input(s): HGBA1C in the last 72 hours.  Fasting Lipid Panel No results for input(s): CHOL, HDL, LDLCALC, TRIG, CHOLHDL, LDLDIRECT in the last 72 hours.   Medications:    Scheduled Medications:  amiodarone   200 mg Per Tube BID   apixaban   5 mg Per Tube BID   bisacodyl   10 mg Rectal Q0600   Chlorhexidine  Gluconate Cloth  6 each Topical Daily   fiber supplement (BANATROL TF)  60 mL Per Tube BID   fluconazole  200 mg Oral Daily   [name] butt cream   Topical BID   HYDROmorphone    Intravenous Q4H   insulin  aspart  0-9 Units Subcutaneous Q4H   lidocaine   1 patch Transdermal Q24H  pantoprazole  (PROTONIX ) IV  40 mg Intravenous Q12H   sacubitril -valsartan   1 tablet Per Tube BID   sodium chloride  flush  10 mL Intrapleural Q8H   sodium chloride  flush  10-40 mL Intracatheter Q12H    Infusions:  feeding supplement (KATE FARMS STANDARD ENT 1.4) 65 mL/hr at 02/22/24 1232   piperacillin -tazobactam (ZOSYN )  IV 3.375 g (02/22/24 0552)   TPN ADULT (ION) 70 mL/hr at 02/22/24 0526   TPN ADULT (ION)      PRN Medications: acetaminophen  (TYLENOL ) oral liquid 160 mg/5 mL, diclofenac Sodium, diphenhydrAMINE **OR** diphenhydrAMINE, hydrALAZINE , levalbuterol, lip balm, loperamide HCl, metoCLOPramide (REGLAN) injection, naloxone  **AND** sodium chloride  flush, ondansetron  (ZOFRAN ) IV, mouth rinse, oxyCODONE , sodium chloride  flush  Patient Profile    Patient with longstanding history of chronic heart failure with preserved ejection fraction, RV failure with history of pulmonary embolism presents with esophageal perforation.  S/P Stent Esophageal  Stent. Stent repositioned 10/15.  Assessment/Plan   1.  Esophageal Perforation - Boerhaave syndrome.  - CT C/A/P: with pneumomediastinum and large mass-like density in lower esophagus - 10/6: EGD with esophageal stent placed  - 10/15: Stent migrated w/ leak > back to OR for repositioning  - 10/24: barium swallow w persistent leak  - 11/3: PEG placed - started on TF; can start switching over the PO meds - Continues on fluconazole; abx escalated d/t PNA, now on Zosyn , primary managing - Would restart fluconazole with stent in place  2. Acute on chronic HFpEF: h/o cardiogenic shock with RV failure in the setting of cardiac arrest thought to be caused by acute PE vs ACS in 3/25. Echo at the time showed EF 55%, severe, LVH, G2DD, and severely reduced RV function with strain. There was concern for cardiac sarcoid amyloid, however CMR LGE consistent with ischemic disease. RV on echo 9/25 with complete recovery, EF 60-65%, nl RV function.    - RHC 3/25: Mild PAH with severe RV failure though CO is preserved.   - Repeat echo 10/25 EF 60-65% - Appears hypovolemic, stop torsemide  especially given creatinine bump - restart entresto  97/103 mg bid - Continue TED hose  3. 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 Medical management.  - suspect CP related to esophageal issues and not CAD - No s/s angina   4.  AKI on CKD Stage 3b: Baseline sCr ~ 2.1. - Bumped creatinine to 2.5  5. PAF:   - s/p TEE/DCCV 3/25 - has converted to AF a couple times this admit - continue amio 200 mg bid - continue eliquis  5 mg bid, check hgb tomorrow am - in NSR on tele    6.  H/o DVT/PE:  Bilateral upper and lower DVTs, mixed acute and chronic.  - s/p infrarenal IVC placement 3/25 - V/Q 3/25 with multiple perfusion defects - continue eliquis    7. Carotid stenosis: h/o CVA. TCAR in 6/24. Okay to remain off plavix  at this point. - carotid US  9/25:  widely patent carotid stent on R and no stenosis on L   8. Acute Hypoxic Respiratory Failure/PNA/ Exudative Lt Pleural Effusion  - on zosyn   - CT placed for pleural effusion. Resp Cx w/ rare yeast, BCx NGTD  - remains on [place] - [name] managing  9. Severe protein calorie malnutrition - Nutrition following. On TPN  - now with PEG tube + TF  10. HTN - BP much better controlled   Morene JINNY Brownie, MD 02/22/2024  Advanced Heart Failure Team Pager [phone] (M-F; 7a - 5p)  Please contact [facility] for night-coverage after hours (4p -7a ) and weekends on [email or web address]

## 2024-02-22 NOTE — Progress Notes (Signed)
 Physical Therapy Treatment Patient Details Name: Billy [name] MRN: [id number] DOB: 1951-02-07 Today's Date: 02/22/2024   History of Present Illness Billy [name] is a 73 y.o. male who presented to [facility] ED 01/21/24 with several days of vomiting followed by an episode of severe tearing epigastric pain. Work-up revealed esophageal tear. Pt s/p esophageal stent with EGD 10/7. Return to OR for esophageal stent adjustment on 10/15, remained intubated after procedure due to suspected aspiration. Extubated 10/16.  Pt had CT placed 10/16. Reintubated 10/20-10/24. G tube placement planned for 11/3. PMHx: HFrEF, AFib, CAD, PE complicated by cardiogenic shock, CKD 4, MI, peripheral neuropathy, and TIA.    PT Comments  Pt received in supine, anxious and flat, oriented to self/situation/location but not fully to month, pt agreeable to therapy session with encouragement and requesting increased time for session. Pt needing consistent +2 physical assist for safety and pt frequently expressing fear of falls, needing +3 for equipment and line mgmt for ambulation. Pt able to progress to household distance gait trial in hallway with min to modA +2 for physical assist and RW management, and needing to sit abruptly as he fatigues, close chair follow provided throughout. Pt needing totalA for hygiene assist and RN notified of pt continued peri anal skin breakdown and pain during hygiene assist. Up in chair with alarm on for safety at end of session, pt agrees to try 1 hour in chair, RN aware. Patient will benefit from continued inpatient follow up therapy, <3 hours/day.    If plan is discharge home, recommend the following: Assistance with cooking/housework;Assist for transportation;Help with stairs or ramp for entrance;A lot of help with bathing/dressing/bathroom;Direct supervision/assist for medications management;Direct supervision/assist for financial management;Supervision due to cognitive status;Two people to help with walking  and/or transfers (+2 for hallway ambulation and step pivot)   Can travel by private vehicle        Equipment Recommendations  Wheelchair (measurements PT);Wheelchair cushion (measurements PT)    Recommendations for Other Services       Precautions / Restrictions Precautions Precautions: Fall Recall of Precautions/Restrictions: Impaired Precaution/Restrictions Comments: BIL chest tubes, G-tube, PCA pump, check BP with postural changes if symptomatic Restrictions Weight Bearing Restrictions Per Provider Order: No     Mobility  Bed Mobility Overal bed mobility: Needs Assistance Bed Mobility: Rolling, Sidelying to Sit Rolling: Min assist Sidelying to sit: Mod assist, +2 for physical assistance       General bed mobility comments: Cues throughout for motor planning/sequencing. increased time to initiate and perform. Rolling to L/R prior to EOB for log roll due to pt received with loose BM upon therapist arrival. Pt able to bridge hips for placement of briefs after clean-up prior to log roll to L side for EOB.    Transfers Overall transfer level: Needs assistance Equipment used: Rolling walker (2 wheels) Transfers: Sit to/from Stand Sit to Stand: Min assist, +2 safety/equipment, From elevated surface   Step pivot transfers: Min assist, +2 physical assistance, From elevated surface       General transfer comment: Min assist for boost and balance to stand and pivot to recliner on his L side. Pt anxious, needed seated rest break and RN arrived to give pain meds through his G-tube prior to pt agreeing to continue gait training.    Ambulation/Gait Ambulation/Gait assistance: Min assist, Mod assist, +2 physical assistance, +2 safety/equipment Gait Distance (Feet): 50 Feet Assistive device: Rolling walker (2 wheels) Gait Pattern/deviations: Step-through pattern, Decreased stride length, Trunk flexed Gait velocity: decreased  General Gait Details: initially up to modA for RW  management/stability, progressing to minA +2 and pt standing too far outside RW, but not following cues well for better proximity to AD. +3 assist for safety as pt expressed fear of falls and needed one person to assist with close chair follow and therapist x2 for guarding/RW management and IV pole mgmt. SpO2 no signal, HR WFL. SpO2 WFL on RA once pt resting in chair and sensor replaced. Pt needing modA mostly when turning   Stairs             Wheelchair Mobility     Tilt Bed    Modified Rankin (Stroke Patients Only)       Balance Overall balance assessment: Needs assistance Sitting-balance support: Feet supported, Bilateral upper extremity supported Sitting balance-Leahy Scale: Fair     Standing balance support: During functional activity, Bilateral upper extremity supported, Reliant on assistive device for balance Standing balance-Leahy Scale: Poor Standing balance comment: reliant on external support                            Communication Communication Communication: Impaired Factors Affecting Communication: Difficulty expressing self  Cognition Arousal: Alert Behavior During Therapy: Anxious, Flat affect   PT - Cognitive impairments: Attention, Sequencing, Problem solving, Awareness, Safety/Judgement, Orientation, Memory   Orientation impairments: Time                   PT - Cognition Comments: Pt unable to state month, oriented to year. Internally distracted due to pain, pt self-directed and needs increased time for planning and processing. Anxiety surrounding all planned movements and expresses fear of falls frequently. Following commands: Impaired Following commands impaired: Follows one step commands with increased time, Only follows one step commands consistently    Cueing Cueing Techniques: Verbal cues, Gestural cues, Tactile cues  Exercises General Exercises - Lower Extremity Ankle Circles/Pumps: AROM, Both, 5 reps, Supine (cues to  perform hourly as able) Other Exercises Other Exercises: bridging x3 reps while repositioning and during peri-care in supine    General Comments General comments (skin integrity, edema, etc.): SpO2 94% and above on RA when pt doffed EtCO2 [place], during bed mobility and transfers/gait. Pt agreeable to return to 1L O2 [place] for comfort and due to PCA pumps system at end of session. HR WFL. [name] butt cream applied by staff after totalA peri care assist due to significant skin breakdown on buttocks and around scrotal and peri anal areas.  Bed linens changed while pt OOB to ensure bed dry and staff notified he needs frequent skin checks, PTA reinforced with pt rolling Q2H and requesting assist with this while awake to assist with healing wounds/skin irritation. Pt agreeable to sit up in chair for 1 hour at end of session, RN/NT notified as pt has fragile skin, geomat cushion in place.     Pertinent Vitals/Pain Pain Assessment Pain Assessment: PAINAD Faces Pain Scale: Hurts a little bit Breathing: occasional labored breathing, short period of hyperventilation Negative Vocalization: occasional moan/groan, low speech, negative/disapproving quality Facial Expression: facial grimacing Body Language: tense, distressed pacing, fidgeting Consolability: distracted or reassured by voice/touch PAINAD Score: 6 Facial Expression: Tense Body Movements: Protection Muscle Tension: Tense, rigid Compliance with ventilator (intubated pts.): N/A Vocalization (extubated pts.): Talking in normal tone or no sound CPOT Total: 3 Pain Location: CT sites, G-tube site Pain Descriptors / Indicators: Discomfort, Grimacing, Guarding Pain Intervention(s): Limited activity within patient's tolerance, Monitored during  session, Repositioned, Patient requesting pain meds-RN notified, RN gave pain meds during session, PCA encouraged    Home Living                          Prior Function            PT Goals  (current goals can now be found in the care plan section) Acute Rehab PT Goals Patient Stated Goal: To get stronger before I go home PT Goal Formulation: With patient Time For Goal Achievement: 03/04/24 (corrected as per supervising PT Logan B, goals updated 02/19/24) Progress towards PT goals: Progressing toward goals    Frequency    Min 2X/week      PT Plan      Co-evaluation PT/OT/SLP Co-Evaluation/Treatment: Yes Reason for Co-Treatment: Complexity of the patient's impairments (multi-system involvement);Necessary to address cognition/behavior during functional activity;For patient/therapist safety;To address functional/ADL transfers PT goals addressed during session: Mobility/safety with mobility;Balance;Proper use of DME;Strengthening/ROM        AM-PAC PT 6 Clicks Mobility   Outcome Measure  Help needed turning from your back to your side while in a flat bed without using bedrails?: A Little Help needed moving from lying on your back to sitting on the side of a flat bed without using bedrails?: A Lot Help needed moving to and from a bed to a chair (including a wheelchair)?: A Lot Help needed standing up from a chair using your arms (e.g., wheelchair or bedside chair)?: A Lot Help needed to walk in hospital room?: A Lot Help needed climbing 3-5 steps with a railing? : Total 6 Click Score: 12    End of Session Equipment Utilized During Treatment: Oxygen ;Gait belt (gait belt under axilla/above tubes; mostly on RA) Activity Tolerance: Patient tolerated treatment well;Other (comment) (with increased time given) Patient left: with call bell/phone within reach;in chair;with chair alarm set;Other (comment) (heels floated for comfort) Nurse Communication: Mobility status;Patient requests pain meds;Other (comment) (Pt very adamant to return to bed after 1 hour) PT Visit Diagnosis: Difficulty in walking, not elsewhere classified (R26.2);Unsteadiness on feet (R26.81);Other  abnormalities of gait and mobility (R26.89);Muscle weakness (generalized) (M62.81);Other symptoms and signs involving the nervous system (R29.898);Pain;History of falling (Z91.81) Pain - part of body:  (abdomen and bottom)     Time: 0822-0939 PT Time Calculation (min) (ACUTE ONLY): 77 min  Charges:    $Gait Training: 8-22 mins $Therapeutic Activity: 23-37 mins PT General Charges $$ ACUTE PT VISIT: 1 Visit                     Sudais Banghart P., PTA Acute Rehabilitation Services Secure Chat Preferred 9a-5:30pm Office: [phone]    Connell HERO [facility] 02/22/2024, 10:37 AM

## 2024-02-22 NOTE — Progress Notes (Addendum)
 Nutrition Brief Note  Patient still titrating up to goal rate enteral nutrition. Currently running at 29ml/hr with plans to increase to 21ml/hr around 11AM. Per MD note review, patient reports improvement in nausea and diarrhea. Will continue to monitor tolerance.   Estimated Nutritional Needs:  Kcal:  2300-2500 kcals Protein:  110-130 g Fluid:  2L  Remains on TPN meeting 100% of estimated needs. Current enteral nutrition regimen (72ml/hr) providing 1760 kcals (77% minimum estimated needs) and 94g protein (85% minimum estimated needs). Given that he will be meeting a majority of his needs with enteral nutrition and estimated protein intake of 210g protein between TF and TPN regimen, will recommend decreasing TPN to meet 50% of estimated needs. This will still sufficiently meet his estimated calorie/protein needs. Will assess for tolerance and titrate TPN back up if he begins to show s/sx of intolerance or malabsorption. MD and pharmacy in agreement.   Modified regimen (TF + TPN) to provide: 2911 kcals (127% estimated calorie needs) and 152g protein (139% estimated protein needs).  Still currently no FWF in place. Will monitor TF tolerance and introduce low volume FWF when able. CBGs stable, however insulin  being taken out of TPN upon reduction of rate. Monitor CBGs and modify insulin  regimen as needed.    INTERVENTION:    Reduce TPN to meet 50% estimated nutritional needs as TF meeting 75% estimated calorie and 85% protein needs -Continue MVI w/ trace elements added to TPN -Titrate TPN back up if patient begins to show s/sx of intolerance   Continue TF via PEG: [place] 1.4 at 65 ml/h (1560 ml per day) Continue to titrate up by 10ml/hr q12h until new goal rate achieved -Prosource TF20 60ml daily -Recommend low volume FWF when able - Add MVI w/ minerals when TPN stopped             - Banatrol BID d/t diarrhea-provides 45kcal, 5g soluble fiber and 2g protein per serving. -Provides 2264  kcal, 117 gm protein, 1108 ml free water  daily      NUTRITION DIAGNOSIS:  Severe Malnutrition related to acute illness (may have chronic component due to time frame of reported wt loss) as evidenced by severe fat depletion, moderate muscle depletion. - remains applicable   GOAL:  Patient will meet greater than or equal to 90% of their needs - met via TF + TPN   MONITOR:  Diet advancement, Labs, Weight trends, I & O's, Skin (TPN)  Billy Deaner MS, RD, LDN Registered Dietitian Clinical Nutrition RD Inpatient Contact Info in Amion

## 2024-02-22 NOTE — Progress Notes (Addendum)
 PHARMACY - TOTAL PARENTERAL NUTRITION CONSULT NOTE   Indication: intolerance to enteral feedings, esophageal perforation  Patient Measurements: Height: 6' 1 (185.4 cm) Weight: 66.8 kg (147 lb 4.3 oz) IBW/kg (Calculated) : 79.9 TPN AdjBW (KG): 80.6 Body mass index is 19.43 kg/m.  Assessment:  73 year old man with medical history significant for cardiac arrest earlier this year who was admitted after several days of nausea and vomiting followed by sudden onset severe tearing epigastric pain radiating to back and chest. 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 Pharmacy consulted to initiate TPN due to inability to tolerate enteral nutrition at this time.  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 PTA takes torsemide  20 mg PO daily, now held. Aggressive fluid resuscitation recommended per GI. Per HF, patient currently euvolemic but will hold diuretic for now. Due to CHF will target lower end of fluid range for TPN and supplement additional fluids per MD outside of TPN to prevent fluid overload.   Stent placed on 10/6, esophogram not showing any leak. Diet advanced to CLD. 10/15 Leak present and made NPO. 10/24 esophagram with persistent leak; will need stent replacement in 3-4 weeks.   10/28 AM update: patient has weights that are significantly more than admission weight of 72.7 kg on 10/5. This may represent false weights from bed scale.  Current weight is 71.6 kg  11/2 initial labs spurious  Glucose / Insulin : A1C 6.0% (02/2023);  BG <150, 13 units insulin  in TPN, 7 units SSI used in 24h Dexamethasone  10 mg given 11/3 Electrolytes: Na 142, K 4.3, Cl 103, CO2 26, Ca 9.5 [CoCa 10.8], Phos 3.5, Mg 2.1 Renal: SCr 2.51 (BL ~2), BUN 93 Hepatic: AST 12 /ALT 15 /tbili 0.5 /alk phos 72, alb 2, TG 56 Intake / Output; MIVF: furosemide  > torsemide  60 PO mg/d started 11/5; UOP 1.4 mL/kg/hr, chest tube 0 ml, LBM 11/5  Net IO Since Admission: 2,980.05 mL  [02/22/24 0704]  GI Imaging: 10/05 CT: concerning for esophageal perforation, masslike area of soft tissue thickening adjacent to and surrounding the esophagus  10/06 Esoph XR: distal esophageal perforation 10/7 Esoph XR: No leak associated with stent coverage of esophageal perforation 10/14 KUB: Gas-filled loops of small bowel and colon suggestive of ileus 10/15 Esoph XR: esophageal perforation/leak at the distal esophagus, proximal to the esophageal stent 10/24 esophagram: persistent leak despite being covered by the esophageal stent.  GI Surgeries / Procedures:  10/6: esophageal stent placement  10/15 EGD and stent repositioning   Central access: 01/22/24 TPN start date: 01/22/24  Nutritional Goals:  Goal concentrated TPN is 70 ml/hr (provides 116 g of protein and 2301 kcals per day  RD Assessment:  Estimated Needs Total Energy Estimated Needs: 2300-2500 kcals Total Protein Estimated Needs: 110-130 g Total Fluid Estimated Needs: 2L  Current Nutrition:  TPN/NPO  10/13- 10/14 DYS1: not taking any po  10/15: NPO d/t increased abd distension  10/22 Propofol  at 7.6- 20.2 ml/hr, providing ~370 kcals/24hr- OFF 10/23: switch propofol  back to dexmedetomidine - OFF 11/4: Mallie Pinion started 20 ml/hr, goal 60 ml/hr 11/5 Mallie Pinion goal 65 ml/hr  11/6 Mallie Pinion at 40 ml/hr (1344 kcal) - tolerated per RN  Plan:  Continue concentrated TPN at goal 70 mL/hr, providing 100% estimated nutritional needs   Electrolytes in TPN: Na 14 mEq/L, K 60 mEq/L, Ca 0 mEq/L, Mg 1 mEq/L, Phos 12 mmol/L. Cl:Ac max acetate Continue standard MVI and trace elements to TPN Continue 13 units insulin  regular  to TPN Monitor TPN labs on Mon/Thurs, and PRN  Plan is to increase Mallie Pinion to 50 ml/hr today F/u TF tolerability and ability to wean TPN once at goal  Thank you for involving pharmacy in this patient's care.  Delon Sax, PharmD, BCPS Clinical Pharmacist Clinical phone for 02/22/2024 is  [phone] 02/22/2024 7:04 AM   Addendum: Will decrease TPN to 50% (35 ml/hr) today per RD and Dr Verdene. Remove insulin  from TPN and will watch CBG trend on sSSI q4h Check BMET, Mg, Phos in am  [facility], PharmD, BCPS 10:22 AM

## 2024-02-22 NOTE — Progress Notes (Signed)
 Occupational Therapy Treatment Patient Details Name: Billy [name] MRN: [id number] DOB: [birth year] Today's Date: 02/22/2024   History of present illness Billy [name] is a 73 y.o. male who presented to [name] 01/21/24 with several days of vomiting followed by an episode of severe tearing epigastric pain. Work-up revealed esophageal tear. Pt s/p esophageal stent with EGD 10/7. Return to OR for esophageal stent adjustment on 10/15, remained intubated after procedure due to suspected aspiration. Extubated 10/16.  Pt had CT placed 10/16. Reintubated 10/20-10/24. G tube placement planned for 11/3. PMHx: HFrEF, AFib, CAD, PE complicated by cardiogenic shock, CKD 4, MI, peripheral neuropathy, and TIA.   OT comments  This 73 yo male seen today in conjunction today to see if we could progress activity level/mobility and he was able to ambulate further today but took increased time due to anxiousness about everything and his ability to process all information as quickly as he would like. He repeats a lot that he needs help with everything asked of him, but he also demonstrates he has the ability to do more on his own as we are with him and needs to be encouraged to do so. He will continue to benefit from acute OT with follow up from continued inpatient follow up therapy, <3 hours/day       If plan is discharge home, recommend the following:  Two people to help with walking and/or transfers;Two people to help with bathing/dressing/bathroom;Assistance with cooking/housework;Assistance with feeding;Direct supervision/assist for medications management;Direct supervision/assist for financial management;Assist for transportation;Help with stairs or ramp for entrance   Equipment Recommendations  Other (comment) (TBD)       Precautions / Restrictions Precautions Precautions: Fall Recall of Precautions/Restrictions: Impaired Precaution/Restrictions Comments: BIL chest tubes, G-tube, PCA pump, check BP with postural  changes if symptomatic Restrictions Weight Bearing Restrictions Per Provider Order: No       Mobility Bed Mobility Overal bed mobility: Needs Assistance Bed Mobility: Rolling, Sidelying to Sit Rolling: Min assist Sidelying to sit: Mod assist, +2 for physical assistance       General bed mobility comments: Cues throughout for motor planning/sequencing. increased time to initiate and perform. Rolling to L/R prior to EOB for log roll due to pt received with loose BM upon therapist arrival. Pt able to bridge hips for placement of briefs after clean-up prior to log roll to L side for EOB.    Transfers Overall transfer level: Needs assistance Equipment used: Rolling walker (2 wheels) Transfers: Sit to/from Stand Sit to Stand: Min assist, +2 safety/equipment, From elevated surface     Step pivot transfers: Min assist, +2 physical assistance, From elevated surface     General transfer comment: Min assist for boost and balance to stand and pivot to recliner on his L side. Pt anxious, needed seated rest break and RN arrived to give pain meds through his G-tube prior to pt agreeing to continue gait training.     Balance Overall balance assessment: Needs assistance Sitting-balance support: Feet supported, Bilateral upper extremity supported Sitting balance-Leahy Scale: Fair Sitting balance - Comments: sits on edge of chair without back support   Standing balance support: During functional activity, Bilateral upper extremity supported, Reliant on assistive device for balance Standing balance-Leahy Scale: Poor Standing balance comment: reliant on external support                           ADL either performed or assessed with clinical judgement   ADL Overall ADL's :  Needs assistance/impaired     Grooming: Set up;Sitting;Wash/dry face Grooming Details (indicate cue type and reason): pt deferred combing his hair                 Toilet Transfer: Minimal assistance;+2  for physical assistance Toilet Transfer Details (indicate cue type and reason): simulated sit<>stand from bed with short ambulation to sit in recliner behind him (VCs to reach back for arms of chair) Toileting- Clothing Manipulation and Hygiene: Total assistance Toileting - Clothing Manipulation Details (indicate cue type and reason): due to diarrhea            Extremity/Trunk Assessment Upper Extremity Assessment Upper Extremity Assessment: Generalized weakness            Vision Baseline Vision/History: 1 Wears glasses Patient Visual Report: No change from baseline           Communication Communication Communication: Impaired Factors Affecting Communication: Difficulty expressing self   Cognition Arousal: Alert Behavior During Therapy: Anxious, Flat affect Cognition: Cognition impaired   Orientation impairments: Time (aware of year, but not month) Awareness: Online awareness impaired Memory impairment (select all impairments): Declarative long-term memory Attention impairment (select first level of impairment): Sustained attention Executive functioning impairment (select all impairments): Reasoning, Problem solving OT - Cognition Comments: pt stated that his pain was a 7 at beginning of session and he used the PCA pump (also used it once during session and once at end of session) as well as RN came in and gave pain meds during session at pt's request and pt reported at end of session pain was better but when asked he still said 7 and when questioned again, oh I really can't answer that question right now                 Following commands: Impaired Following commands impaired: Follows one step commands with increased time      Cueing   Cueing Techniques: Verbal cues, Gestural cues, Tactile cues        General Comments SpO2 94% and above on RA when pt doffed EtCO2 [name], during bed mobility and transfers/gait. Pt agreeable to return to 1L O2 [name] for comfort and due  to PCA pumps system at end of session. HR WFL. [name] butt cream applied by staff after totalA peri care assist due to significant skin breakdown on buttocks and around scrotal and peri anal areas.    Pertinent Vitals/ Pain       Pain Assessment Pain Assessment: 0-10 Pain Score: 7  Pain Location: CT sites, G-tube site Pain Descriptors / Indicators: Discomfort, Grimacing, Guarding Pain Intervention(s): Limited activity within patient's tolerance, Monitored during session, Premedicated before session, Patient requesting pain meds-RN notified, PCA encouraged, Repositioned         Frequency  Min 2X/week        Progress Toward Goals  OT Goals(current goals can now be found in the care plan section)  Progress towards OT goals: Progressing toward goals  Acute Rehab OT Goals OT Goal Formulation: With patient Time For Goal Achievement: 03/04/24 Potential to Achieve Goals: Fair  Plan      Co-evaluation    PT/OT/SLP Co-Evaluation/Treatment: Yes Reason for Co-Treatment: Complexity of the patient's impairments (multi-system involvement);Necessary to address cognition/behavior during functional activity;For patient/therapist safety;To address functional/ADL transfers PT goals addressed during session: Mobility/safety with mobility;Balance;Proper use of DME;Strengthening/ROM OT goals addressed during session: ADL's and self-care;Strengthening/ROM      AM-PAC OT 6 Clicks Daily Activity     Outcome  Measure   Help from another person eating meals?: Total (NPO, G-tube) Help from another person taking care of personal grooming?: A Little Help from another person toileting, which includes using toliet, bedpan, or urinal?: Total Help from another person bathing (including washing, rinsing, drying)?: A Lot Help from another person to put on and taking off regular upper body clothing?: A Lot Help from another person to put on and taking off regular lower body clothing?: A Lot 6 Click  Score: 11    End of Session Equipment Utilized During Treatment: Rolling walker (2 wheels);Gait belt  OT Visit Diagnosis: Muscle weakness (generalized) (M62.81);Pain Pain - part of body:  (Bil CT sites, and GT site)   Activity Tolerance Patient tolerated treatment well (just very anxious and likes to do things in his own time/way)   Patient Left in chair;with call bell/phone within reach;with chair alarm set   Nurse Communication Mobility status        Time: 0822-0939 OT Time Calculation (min): 77 min  Charges: OT General Charges $OT Visit: 1 Visit OT Treatments $Self Care/Home Management : 23-37 mins  Donny BECKER OT Acute Rehabilitation Services Office [phone]    [place] Dorothyann Distel 02/22/2024, 10:59 AM

## 2024-02-22 NOTE — Progress Notes (Signed)
 PROGRESS NOTE  Billy [name] FMW:[id number] DOB: 02-10-1951   PCP: Seabron Lenis, MD  Patient is from: Home.  DOA: 01/21/2024 LOS: 32   Brief Narrative / Interim history: 73 year old M with PMH of cardiac arrest earlier this year along with HFrEF, Afib, PE presented with several days of nausea and vomiting from a stomach bug followed by sudden onset severe tearing epigastric pain radiating to back and chest. Workup revealed esophageal tear/Boerhaave. Patient had esophageal stent placed on 10/7.  Subsequent swallow study revealed an additional leak, and he had stent repositioned on 01/31/2024.  He became hypoxic with gastric contents noted on intubation, and he was transferred to ICU for mechanical ventilation.  Bedside ultrasound with right pleural effusion for which he had chest tube placed with removal of 300 cc exudative fluid.  He was also successfully extubated on 10/16.  Patient has had increased oxygen  requirement requiring reintubation on 10/20.  Bedside ultrasound showed left-sided pleural effusion and he had left-sided chest tube with 120 cc exudative output. ICU stay complicated by high O2 requirement, left sided pleural effusion, chest tube placement 10/16, reintubated 10/20.  Eventually, patient was extubated and transferred out of ICU on 10/28. Hospital course complicated by acute on chronic HFpEF for which he is on IV Lasix  per advanced heart failure team. PEG tube placed on 11/3.  Dietitian consulted for initiation of tube feed.  Remains on TPN until tube feed at goal.  Subjective: Patient mentions her nausea is better.  Diarrhea is better.  No other complaints offered.     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  RD consulted for initiation of TF -Continue TPN until TF at  goal -Bilateral chest tube in place.  CTS managing -On IV Zosyn  per CTS.  Acute hypoxic/ hypercapnic respiratory failure- Multifactorial including aspiration pneumonia, pleural effusion and CHF.  Trach aspirate on 10/15 with Candida albicans. -IV meropenem>> IV Zosyn  for pneumonia per CTS. -Diflucan for Candida albicans from trach aspirate from 10/15-11/4. -Chest tube for pleural effusion per CTS -IV Lasix  per advanced heart failure team -Continue bronchodilators Respiratory status is stable.   Acute on chronic diastolic CHF Limited TTE on 10/23 with LVEF of 60 to 65%.  RHC in 3/25 with mild PAH with severe RV failure but preserved CO.  Appears euvolemic on exam. -Monitor fluid and respiratory status, renal functions, electrolytes Patient noted to be on torsemide .  Not on any other GDMT at this time. Heart failure team has been following.   AKI on CKD-3B:  baseline Cr~2.0.  AKI had resolved but increasing creatinine noted today.  Recheck labs tomorrow. Patient has noted to be on Entresto  which was initiated on 11/5.  Also on torsemide  which was also initiated on 11/5.  May need to change the doses of these medications.  Will wait for cardiology to weigh in first.   Paroxysmal A-fib:  s/p TEE DCCV 3/25.  Seems to be in sinus rhythm. -Transitioned to p.o. amiodarone  and Eliquis    Intractable pain Required ketamine and started on IV Dilaudid  PCA while in ICU. -Start oxycodone  per PEG tube -Decreasing PCA with a goal to wean off or changed to as needed Dilaudid .  H/o PE/ DVT-  s/p infrarenal IVC placement 3/25. V/Q 3/25 with multiple perfusion defects - Now on Eliquis .  Essential hypertension:  BP within acceptable range. -Pain control as  above -Diuretics as above. -IV hydralazine  as needed  Nausea and diarrhea Nausea and diarrhea have improved.  Continue metoprolol  provide as needed and Imodium as needed.  Abdominal remains benign.    Unwitnessed fall in the hospital:   Found down on the floor the evening of 10/31.  No apparent injury or focal neurodeficit. - Fall precaution  Anxiety disorder: Stable. -Ativan  as needed   Acute on chronic anemia -Monitor H&H.   Leukocytosis: Resolved.  GERD - IV PPI.  Goal of care: Noted DNR paperwork in [facility] but patient confirms full CODE STATUS.SABRA   Severe protein calorie malnutrition/hypoalbuminemia Body mass index is 19.43 kg/m. Nutrition Problem: Severe Malnutrition Etiology: acute illness (may have chronic component due to time frame of reported wt loss) Signs/Symptoms: severe fat depletion, moderate muscle depletion Interventions: Refer to RD note for recommendations  Pressure skin injury Wound 02/07/24 0900 Pressure Injury Foot Anterior;Right Stage 1 -  Intact skin with non-blanchable redness of a localized area usually over a bony prominence. (Active)     Wound 02/07/24 0900 Pressure Injury Foot Anterior;Left Stage 1 -  Intact skin with non-blanchable redness of a localized area usually over a bony prominence. (Active)     Wound 02/07/24 0900 Pressure Injury Buttocks Right Stage 1 -  Intact skin with non-blanchable redness of a localized area usually over a bony prominence. (Active)   DVT prophylaxis: On Eliquis  now Code Status: Full code Family Communication: None at bedside today. Disposition:  SNF when medically stable  Consultants:  Gastroenterology Critical care Cardiology Cardiothoracic surgery  Microbiology summarized: 10/5-MRSA PCR screen positive 10/15-trach aspirate culture with Candida albicans.  Objective: Vitals:   02/22/24 0417 02/22/24 0434 02/22/24 0806 02/22/24 0916  BP:   (!) 149/70   Pulse:   71   Resp: 15  13 14   Temp:   98.2 F (36.8 C)   TempSrc:   Oral   SpO2:   98%   Weight:  66.8 kg    Height:        Examination:  General appearance: Awake alert.  In no distress Resp: Clear to auscultation bilaterally.  Normal effort Cardio: S1-S2 is normal regular.   No S3-S4.  No rubs murmurs or bruit GI: Abdomen is soft.  Nontender nondistended.  Bowel sounds are present normal.  No masses organomegaly.  PEG tube is noted. Extremities: Able to move his extremities but physical deconditioning is present. Neurologic: Alert and oriented x3.  No focal neurological deficits.     Sch Meds:  Scheduled Meds:  amiodarone   200 mg Per Tube BID   apixaban   5 mg Per Tube BID   bisacodyl   10 mg Rectal Q0600   Chlorhexidine  Gluconate Cloth  6 each Topical Daily   fiber supplement (BANATROL TF)  60 mL Per Tube BID   [name] butt cream   Topical BID   HYDROmorphone    Intravenous Q4H   insulin  aspart  0-9 Units Subcutaneous Q4H   lidocaine   1 patch Transdermal Q24H   pantoprazole  (PROTONIX ) IV  40 mg Intravenous Q12H   sacubitril -valsartan   1 tablet Per Tube BID   sodium chloride  flush  10 mL Intrapleural Q8H   sodium chloride  flush  10-40 mL Intracatheter Q12H   torsemide   60 mg Per Tube Daily   Continuous Infusions:  feeding supplement (KATE FARMS STANDARD ENT 1.4) 40 mL/hr at 02/22/24 0526   piperacillin -tazobactam (ZOSYN )  IV 3.375 g (02/22/24 0552)   TPN ADULT (ION) 70 mL/hr at 02/22/24 0526  PRN Meds:.acetaminophen  (TYLENOL ) oral liquid 160 mg/5 mL, diclofenac Sodium, diphenhydrAMINE **OR** diphenhydrAMINE, hydrALAZINE , levalbuterol, lip balm, loperamide HCl, metoCLOPramide (REGLAN) injection, naloxone  **AND** sodium chloride  flush, ondansetron  (ZOFRAN ) IV, mouth rinse, oxyCODONE , sodium chloride  flush  Antimicrobials: Anti-infectives (From admission, onward)    Start     Dose/Rate Route Frequency Ordered Stop   02/13/24 0900  piperacillin -tazobactam (ZOSYN ) IVPB 3.375 g        3.375 g 12.5 mL/hr over 240 Minutes Intravenous Every 8 hours 02/13/24 0812     02/07/24 0800  vancomycin (VANCOCIN) IVPB 1000 mg/200 mL premix  Status:  Discontinued        1,000 mg 200 mL/hr over 60 Minutes Intravenous Every 24 hours 02/07/24 0632 02/09/24 0905    02/06/24 2200  meropenem (MERREM) 1 g in sodium chloride  0.9 % 100 mL IVPB  Status:  Discontinued        1 g 200 mL/hr over 30 Minutes Intravenous Every 12 hours 02/06/24 1127 02/13/24 0812   02/06/24 1300  vancomycin (VANCOREADY) IVPB 1750 mg/350 mL  Status:  Discontinued        1,750 mg 175 mL/hr over 120 Minutes Intravenous Every 48 hours 02/04/24 1225 02/04/24 1226   02/06/24 0800  vancomycin (VANCOCIN) IVPB 1000 mg/200 mL premix        1,000 mg 200 mL/hr over 60 Minutes Intravenous  Once 02/06/24 0629 02/07/24 0829   02/04/24 1400  meropenem (MERREM) 1 g in sodium chloride  0.9 % 100 mL IVPB  Status:  Discontinued        1 g 200 mL/hr over 30 Minutes Intravenous Every 8 hours 02/04/24 1208 02/06/24 1127   02/04/24 1300  linezolid  (ZYVOX ) IVPB 600 mg  Status:  Discontinued        600 mg 300 mL/hr over 60 Minutes Intravenous Every 12 hours 02/04/24 1202 02/04/24 1208   02/04/24 1300  vancomycin (VANCOREADY) IVPB 2000 mg/400 mL        2,000 mg 200 mL/hr over 120 Minutes Intravenous  Once 02/04/24 1208 02/04/24 1623   02/04/24 1208  vancomycin variable dose per unstable renal function (pharmacist dosing)  Status:  Discontinued         Does not apply See admin instructions 02/04/24 1208 02/07/24 0632   01/31/24 1030  fluconazole (DIFLUCAN) IVPB 200 mg  Status:  Discontinued        200 mg 100 mL/hr over 60 Minutes Intravenous Daily 01/31/24 0938 02/20/24 1218   01/30/24 1445  piperacillin -tazobactam (ZOSYN ) IVPB 3.375 g  Status:  Discontinued        3.375 g 12.5 mL/hr over 240 Minutes Intravenous Every 8 hours 01/30/24 1354 02/04/24 1203   01/26/24 1000  amoxicillin-clavulanate (AUGMENTIN) 400-57 MG/5ML suspension 875 mg  Status:  Discontinued        875 mg Oral Every 12 hours 01/26/24 0658 01/30/24 1354   01/26/24 1000  fluconazole (DIFLUCAN) 40 MG/ML suspension 200 mg  Status:  Discontinued        200 mg Oral Daily 01/26/24 0658 01/31/24 0938   01/22/24 1445  fluconazole (DIFLUCAN) IVPB  400 mg  Status:  Discontinued        400 mg 100 mL/hr over 120 Minutes Intravenous Every 24 hours 01/22/24 1436 01/22/24 1439   01/22/24 1445  fluconazole (DIFLUCAN) IVPB 200 mg  Status:  Discontinued        200 mg 100 mL/hr over 60 Minutes Intravenous Every 24 hours 01/22/24 1439 01/26/24 0658   01/21/24 2200  piperacillin -tazobactam (ZOSYN )  IVPB 3.375 g  Status:  Discontinued       Placed in Followed by Linked Group   3.375 g 12.5 mL/hr over 240 Minutes Intravenous Every 8 hours 01/21/24 1607 01/26/24 0658   01/21/24 1615  piperacillin -tazobactam (ZOSYN ) IVPB 3.375 g       Placed in Followed by Linked Group   3.375 g 100 mL/hr over 30 Minutes Intravenous  Once 01/21/24 1607 01/21/24 1651        CBC: Recent Labs  Lab 02/18/24 0320 02/18/24 0521 02/19/24 0500 02/20/24 0617 02/22/24 0441  WBC 13.2* 16.9* 8.9 10.7* 12.2*  HGB 11.8* 12.8* 11.5* 11.4* 12.7*  HCT 39.2 40.5 36.4* 35.8* 40.5  MCV 95.4 88.0 87.5 87.7 88.6  PLT 269 311 242 220 259   BMP &GFR Recent Labs  Lab 02/18/24 0320 02/18/24 0521 02/19/24 0500 02/20/24 0617 02/21/24 0632 02/21/24 0633 02/22/24 0441  NA 132* 139 141  --   --  143 142  K 6.7* 4.2 4.2  --   --  4.2 4.3  CL 98 102 103  --   --  103 103  CO2 22 23 26   --   --  25 26  GLUCOSE 767* 111* 135*  --   --  145* 114*  BUN 69* 74* 80*  --   --  79* 93*  CREATININE 1.91* 2.18* 2.20*  --   --  2.01* 2.51*  CALCIUM  8.3* 9.5 9.5  --   --  9.5 9.2  MG 2.4 2.4 2.5* 2.4 2.1  --  2.1  PHOS 6.0* 4.1 4.0  --   --  3.1 3.5   Estimated Creatinine Clearance: 25.5 mL/min (A) (by C-G formula based on SCr of 2.51 mg/dL (H)).  Liver & Pancreas: Recent Labs  Lab 02/18/24 0320 02/18/24 0521 02/19/24 0500 02/21/24 0633 02/22/24 0441  AST  --   --  12*  --  12*  ALT  --   --  15  --  15  ALKPHOS  --   --  72  --  70  BILITOT  --   --  0.5  --  0.5  PROT  --   --  7.1  --  6.8  ALBUMIN  1.8* 2.1* 1.9* 2.0* 2.0*    Recent Labs  Lab 02/21/24 1626  02/21/24 1953 02/22/24 0001 02/22/24 0408 02/22/24 0805  GLUCAP 140* 134* 145* 123* 138*     Microbiology: No results found for this or any previous visit (from the past 240 hours).   Radiology Studies: No results found.  Victorya Hillman 02/22/2024  If 7PM-7AM, please contact night-coverage [email or web address] 02/22/2024, 9:20 AM

## 2024-02-23 ENCOUNTER — Inpatient Hospital Stay (HOSPITAL_COMMUNITY)

## 2024-02-23 DIAGNOSIS — E43 Unspecified severe protein-calorie malnutrition: Secondary | ICD-10-CM | POA: Diagnosis not present

## 2024-02-23 DIAGNOSIS — R918 Other nonspecific abnormal finding of lung field: Secondary | ICD-10-CM | POA: Diagnosis not present

## 2024-02-23 DIAGNOSIS — I5033 Acute on chronic diastolic (congestive) heart failure: Secondary | ICD-10-CM | POA: Diagnosis not present

## 2024-02-23 DIAGNOSIS — Z4682 Encounter for fitting and adjustment of non-vascular catheter: Secondary | ICD-10-CM | POA: Diagnosis not present

## 2024-02-23 DIAGNOSIS — I700 Atherosclerosis of aorta: Secondary | ICD-10-CM | POA: Diagnosis not present

## 2024-02-23 DIAGNOSIS — K223 Perforation of esophagus: Secondary | ICD-10-CM | POA: Diagnosis not present

## 2024-02-23 DIAGNOSIS — R110 Nausea: Secondary | ICD-10-CM | POA: Diagnosis not present

## 2024-02-23 DIAGNOSIS — J90 Pleural effusion, not elsewhere classified: Secondary | ICD-10-CM | POA: Diagnosis not present

## 2024-02-23 LAB — GLUCOSE, CAPILLARY
Glucose-Capillary: 141 mg/dL — ABNORMAL HIGH (ref 70–99)
Glucose-Capillary: 146 mg/dL — ABNORMAL HIGH (ref 70–99)
Glucose-Capillary: 152 mg/dL — ABNORMAL HIGH (ref 70–99)
Glucose-Capillary: 151 mg/dL — ABNORMAL HIGH (ref 70–99)

## 2024-02-23 LAB — PHOSPHORUS: Phosphorus: 3.8 mg/dL (ref 2.5–4.6)

## 2024-02-23 LAB — CBC
HCT: 40.3 % (ref 39.0–52.0)
Hemoglobin: 12.6 g/dL — ABNORMAL LOW (ref 13.0–17.0)
MCH: 27.6 pg (ref 26.0–34.0)
MCHC: 31.3 g/dL (ref 30.0–36.0)
MCV: 88.4 fL (ref 80.0–100.0)
Platelets: 235 K/uL (ref 150–400)
RBC: 4.56 MIL/uL (ref 4.22–5.81)
RDW: 19.3 % — ABNORMAL HIGH (ref 11.5–15.5)
WBC: 11.1 K/uL — ABNORMAL HIGH (ref 4.0–10.5)
nRBC: 0 % (ref 0.0–0.2)

## 2024-02-23 LAB — BASIC METABOLIC PANEL WITH GFR
Anion gap: 19 — ABNORMAL HIGH (ref 5–15)
BUN: 106 mg/dL — ABNORMAL HIGH (ref 8–23)
CO2: 24 mmol/L (ref 22–32)
Calcium: 9 mg/dL (ref 8.9–10.3)
Chloride: 101 mmol/L (ref 98–111)
Creatinine, Ser: 2.71 mg/dL — ABNORMAL HIGH (ref 0.61–1.24)
GFR, Estimated: 24 mL/min — ABNORMAL LOW (ref >60)
Glucose, Bld: 141 mg/dL — ABNORMAL HIGH (ref 70–99)
Potassium: 4 mmol/L (ref 3.5–5.1)
Sodium: 144 mmol/L (ref 135–145)

## 2024-02-23 LAB — MAGNESIUM: Magnesium: 2.2 mg/dL (ref 1.7–2.4)

## 2024-02-23 MED ORDER — KATE FARMS STANDARD 1.4 EN LIQD
1000.0000 mL | ENTERAL | Status: DC
  Administered 2024-02-23 – 2024-02-25 (×3): 1000 mL
  Filled 2024-02-23 (×4): qty 1000

## 2024-02-23 MED ORDER — HYDRALAZINE HCL 25 MG PO TABS
25.0000 mg | ORAL_TABLET | Freq: Three times a day (TID) | ORAL | Status: DC
  Administered 2024-02-23 – 2024-02-26 (×9): 25 mg
  Filled 2024-02-23 (×8): qty 1

## 2024-02-23 MED ORDER — ISOSORBIDE MONONITRATE ER 30 MG PO TB24
30.0000 mg | ORAL_TABLET | Freq: Every day | ORAL | Status: DC
  Filled 2024-02-23: qty 1

## 2024-02-23 MED ORDER — SACCHAROMYCES BOULARDII 250 MG PO CAPS
250.0000 mg | ORAL_CAPSULE | Freq: Two times a day (BID) | ORAL | Status: DC
  Administered 2024-02-23 – 2024-04-27 (×124): 250 mg
  Filled 2024-02-23 (×117): qty 1

## 2024-02-23 MED ORDER — DIPHENOXYLATE-ATROPINE 2.5-0.025 MG PO TABS
1.0000 | ORAL_TABLET | Freq: Four times a day (QID) | ORAL | Status: AC
  Administered 2024-02-23 (×4): 1
  Filled 2024-02-23 (×4): qty 1

## 2024-02-23 MED ORDER — DIPHENOXYLATE-ATROPINE 2.5-0.025 MG PO TABS
1.0000 | ORAL_TABLET | Freq: Four times a day (QID) | ORAL | Status: DC | PRN
  Administered 2024-02-24 – 2024-04-15 (×39): 1
  Filled 2024-02-23 (×43): qty 1

## 2024-02-23 MED ORDER — SODIUM CHLORIDE 0.9 % IV SOLN
12.5000 mg | Freq: Four times a day (QID) | INTRAVENOUS | Status: DC | PRN
  Administered 2024-02-23 – 2024-04-27 (×8): 12.5 mg via INTRAVENOUS
  Filled 2024-02-23: qty 0.5
  Filled 2024-02-23: qty 12.5
  Filled 2024-02-23 (×4): qty 0.5
  Filled 2024-02-23: qty 12.5
  Filled 2024-02-23: qty 0.5
  Filled 2024-02-23 (×3): qty 12.5
  Filled 2024-02-23: qty 0.5

## 2024-02-23 MED ORDER — TRACE MINERALS CU-MN-SE-ZN 300-55-60-3000 MCG/ML IV SOLN
INTRAVENOUS | Status: AC
  Filled 2024-02-23: qty 386.4

## 2024-02-23 MED ORDER — BANATROL TF EN LIQD
60.0000 mL | Freq: Three times a day (TID) | ENTERAL | Status: DC
  Administered 2024-02-23 – 2024-02-26 (×9): 60 mL
  Filled 2024-02-23 (×11): qty 60

## 2024-02-23 MED ORDER — HYDRALAZINE HCL 25 MG PO TABS
25.0000 mg | ORAL_TABLET | Freq: Three times a day (TID) | ORAL | Status: DC
  Filled 2024-02-23: qty 1

## 2024-02-23 NOTE — Progress Notes (Signed)
 PHARMACY - TOTAL PARENTERAL NUTRITION CONSULT NOTE   Indication: intolerance to enteral feedings, esophageal perforation  Patient Measurements: Height: 6' 1 (185.4 cm) Weight: 68.5 kg (151 lb 0.2 oz) IBW/kg (Calculated) : 79.9 TPN AdjBW (KG): 80.6 Body mass index is 19.92 kg/m.  Assessment:  73 year old man with medical history significant for cardiac arrest earlier this year who was admitted after several days of nausea and vomiting followed by sudden onset severe tearing epigastric pain radiating to back and chest. 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 Pharmacy consulted to initiate TPN due to inability to tolerate enteral nutrition at this time.  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 PTA takes torsemide  20 mg PO daily, now held. Aggressive fluid resuscitation recommended per GI. Per HF, patient currently euvolemic but will hold diuretic for now. Due to CHF will target lower end of fluid range for TPN and supplement additional fluids per MD outside of TPN to prevent fluid overload.   Stent placed on 10/6, esophogram not showing any leak. Diet advanced to CLD. 10/15 Leak present and made NPO. 10/24 esophagram with persistent leak; will need stent replacement in 3-4 weeks.   10/28 AM update: patient has weights that are significantly more than admission weight of 72.7 kg on 10/5. This may represent false weights from bed scale.  Current weight is 71.6 kg  11/2 initial labs spurious  Glucose / Insulin : A1C 6.0% (02/2023);  BG <150, 4 units SSI used in 24h Dexamethasone  10 mg given 11/3 Electrolytes: Na 144, K 4, Cl 101, CO2 24, Ca 9 [CoCa 10.6], Phos 3.8, Mg 2.2 Renal: SCr 2.71 (BL ~2), BUN up 106 Hepatic: AST 12 /ALT 15 /tbili 0.5 /alk phos 72, alb 2, TG 56 Intake / Output; MIVF: furosemide  > torsemide  stopped 11/6; UOP 0.9 mL/kg/hr, chest tube 0 ml, LBM 11/6 [4 loose BMs charted], Florastor added, on Banatrol, Lamotil qid Net IO  Since Admission: 2,987.32 mL [02/23/24 0632]  GI Imaging: 10/05 CT: concerning for esophageal perforation, masslike area of soft tissue thickening adjacent to and surrounding the esophagus  10/06 Esoph XR: distal esophageal perforation 10/7 Esoph XR: No leak associated with stent coverage of esophageal perforation 10/14 KUB: Gas-filled loops of small bowel and colon suggestive of ileus 10/15 Esoph XR: esophageal perforation/leak at the distal esophagus, proximal to the esophageal stent 10/24 esophagram: persistent leak despite being covered by the esophageal stent.  GI Surgeries / Procedures:  10/6: esophageal stent placement  10/15 EGD and stent repositioning   Central access: 01/22/24 TPN start date: 01/22/24  Nutritional Goals:  Goal concentrated TPN is 70 ml/hr (provides 116 g of protein and 2301 kcals per day  RD Assessment:  Estimated Needs Total Energy Estimated Needs: 2300-2500 kcals Total Protein Estimated Needs: 110-130 g Total Fluid Estimated Needs: 2L  Current Nutrition:  TPN/NPO  10/13- 10/14 DYS1: not taking any po  10/15: NPO d/t increased abd distension  10/22 Propofol  at 7.6- 20.2 ml/hr, providing ~370 kcals/24hr- OFF 10/23: switch propofol  back to dexmedetomidine - OFF 11/4: Mallie Pinion started 20 ml/hr, goal 60 ml/hr 11/5 Mallie Pinion goal 65 ml/hr  11/6 Mallie Pinion at 40 ml/hr (1344 kcal) - tolerated per RN 11/7 Mallie Pinion at 60 ml/hr but decreasing to 45 ml/hr d/t diarrhea  Plan:  Continue concentrated TPN at goal 35 mL/hr (50% of goal), providing 100% estimated nutritional needs   Electrolytes in TPN: Na 14 mEq/L, K 60 mEq/L, Ca 0 mEq/L, Mg 1 mEq/L, Phos  12 mmol/L. Cl:Ac max acetate Continue standard MVI and trace elements to TPN Continue 13 units insulin  regular to TPN Monitor TPN labs on Mon/Thurs, BMET in am and PRN  Plan is to reduce Mallie Pinion to 45 ml/hr today F/u TF tolerability and ability to wean TPN once at goal  Thank you for involving pharmacy  in this patient's care.  Delon Sax, PharmD, BCPS Clinical Pharmacist Clinical phone for 02/23/2024 is [phone] 02/23/2024 6:32 AM

## 2024-02-23 NOTE — Progress Notes (Signed)
 Pt has had multiple loose/watery stools, unrelieved with Imodium. Bilateral chest tubes to water  seal with no output. Attempted to notify Mansy, MD. See new orders.  Lonell LITTIE Lyme, RN

## 2024-02-23 NOTE — Progress Notes (Addendum)
 PROGRESS NOTE  Billy [name] FMW:[id number] DOB: [birth year]   PCP: Seabron Lenis, MD  Patient is from: Home.  DOA: 01/21/2024 LOS: 33   Brief Narrative / Interim history: 73 year old M with PMH of cardiac arrest earlier this year along with HFrEF, Afib, PE presented with several days of nausea and vomiting from a stomach bug followed by sudden onset severe tearing epigastric pain radiating to back and chest. Workup revealed esophageal tear/Boerhaave. Patient had esophageal stent placed on 10/7.  Subsequent swallow study revealed an additional leak, and he had stent repositioned on 01/31/2024.  He became hypoxic with gastric contents noted on intubation, and he was transferred to ICU for mechanical ventilation.  Bedside ultrasound with right pleural effusion for which he had chest tube placed with removal of 300 cc exudative fluid.  He was also successfully extubated on 10/16.  Patient has had increased oxygen  requirement requiring reintubation on 10/20.  Bedside ultrasound showed left-sided pleural effusion and he had left-sided chest tube with 120 cc exudative output. ICU stay complicated by high O2 requirement, left sided pleural effusion, chest tube placement 10/16, reintubated 10/20.  Eventually, patient was extubated and transferred out of ICU on 10/28. Hospital course complicated by acute on chronic HFpEF for which he is on IV Lasix  per advanced heart failure team. PEG tube placed on 11/3.  Dietitian consulted for initiation of tube feed.  Remains on TPN until tube feed at goal.  Subjective: Had few more loose stools overnight.  Patient is very frustrated by lack of progress.  Also complains of some nausea but no vomiting.  No other complaints offered.    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  RD consulted for initiation of TF -Continue TPN until TF at goal -Bilateral chest tube in place.  CTS managing -On IV Zosyn  per CTS. Duration of antibiotics is not clear yet.  Acute hypoxic/ hypercapnic respiratory failure- Multifactorial including aspiration pneumonia, pleural effusion and CHF.  Trach aspirate on 10/15 with Candida albicans. -IV meropenem>> IV Zosyn  for pneumonia per CTS. -Diflucan for Candida albicans from trach aspirate from 10/15-11/4. -Chest tube for pleural effusion per CTS -Continue bronchodilators Respiratory status is stable.   Acute on chronic diastolic CHF Limited TTE on 10/23 with LVEF of 60 to 65%.  RHC in 3/25 with mild PAH with severe RV failure but preserved CO.  Appears euvolemic on exam. -Monitor fluid and respiratory status, renal functions, electrolytes. Heart failure team has been following.  Patient was started on Entresto .  Torsemide  was discontinued due to increasing creatinine.  Increasing creatinine noted again today.  Await heart failure team input.   AKI on CKD-3B:  Baseline Cr~2.0.  Renal function had improved but increasing creatinine noted over the last 2 days.  Torsemide  was held. Remains on Entresto . Await cardiology input today.     Paroxysmal A-fib:  s/p TEE DCCV 3/25.  Seems to be in sinus rhythm. Transitioned to p.o. amiodarone  and Eliquis    Intractable pain Required ketamine and started on IV Dilaudid  PCA while in ICU. Start oxycodone  per PEG tube Decreasing PCA with a goal to wean off or changed to as needed Dilaudid .  H/o PE/ DVT-  S/p infrarenal IVC placement 3/25. V/Q 3/25 with multiple perfusion defects Now on Eliquis .  Essential hypertension:  BP within acceptable range. -Pain control as above -Diuretics as above. -  IV hydralazine  as needed  Nausea and diarrhea Persistent nausea and diarrhea.  Likely due to tube feedings.  Will change Zofran  to Phenergan.  Will change Imodium to Lomotil.  Reglan can be used  as needed but may worsen the diarrhea.  Abdomen remains benign to examination.  Do not suspect infectious etiology.     Unwitnessed fall in the hospital:  Found down on the floor the evening of 10/31.  No apparent injury or focal neurodeficit. - Fall precaution  Anxiety disorder: Stable. -Ativan  as needed   Acute on chronic anemia -Monitor H&H.   Leukocytosis: Resolved.  GERD - IV PPI.  Goal of care: Noted DNR paperwork in [facility] but patient confirms full CODE STATUS.SABRA   Severe protein calorie malnutrition/hypoalbuminemia Body mass index is 19.92 kg/m. Nutrition Problem: Severe Malnutrition Etiology: acute illness (may have chronic component due to time frame of reported wt loss) Signs/Symptoms: severe fat depletion, moderate muscle depletion Interventions: Refer to RD note for recommendations Patient was started on TPN.  G-tube was subsequently placed.  Now on tube feedings.  TPN being weaned down gradually.  Pressure skin injury Wound 02/07/24 0900 Pressure Injury Foot Anterior;Right Stage 1 -  Intact skin with non-blanchable redness of a localized area usually over a bony prominence. (Active)     Wound 02/07/24 0900 Pressure Injury Foot Anterior;Left Stage 1 -  Intact skin with non-blanchable redness of a localized area usually over a bony prominence. (Active)     Wound 02/07/24 0900 Pressure Injury Buttocks Right Stage 1 -  Intact skin with non-blanchable redness of a localized area usually over a bony prominence. (Active)   DVT prophylaxis: On Eliquis  now Code Status: Full code Family Communication: None at bedside today. Disposition:  SNF when medically stable  Consultants:  Gastroenterology Critical care Cardiology Cardiothoracic surgery  Microbiology summarized: 10/5-MRSA PCR screen positive 10/15-trach aspirate culture with Candida albicans.  Objective: Vitals:   02/23/24 0326 02/23/24 0330 02/23/24 0749 02/23/24 0814  BP:    135/65  Pulse:    73  Resp:  12  13 12   Temp:    98.4 F (36.9 C)  TempSrc:    Oral  SpO2:    97%  Weight:  68.5 kg    Height:        Examination:  General appearance: Awake alert.  In no distress Resp: Clear to auscultation bilaterally.  Normal effort Chest tube noted Cardio: S1-S2 is normal regular.  No S3-S4.  No rubs murmurs or bruit.  PEG tube noted GI: Abdomen is soft.  Nontender nondistended.  Bowel sounds are present normal.  No masses organomegaly Extremities: No edema.  Full range of motion of lower extremities. Neurologic: Alert and oriented x3.  No focal neurological deficits.     Sch Meds:  Scheduled Meds:  amiodarone   200 mg Per Tube BID   apixaban   5 mg Per Tube BID   bisacodyl   10 mg Rectal Q0600   Chlorhexidine  Gluconate Cloth  6 each Topical Daily   diphenoxylate-atropine  1 tablet Per Tube QID   fiber supplement (BANATROL TF)  60 mL Per Tube BID   fluconazole  200 mg Per Tube Daily   [name] butt cream   Topical BID   HYDROmorphone    Intravenous Q4H   insulin  aspart  0-9 Units Subcutaneous Q4H   lidocaine   1 patch Transdermal Q24H   pantoprazole  (PROTONIX ) IV  40 mg Intravenous Q12H   sacubitril -valsartan   1 tablet Per Tube BID   sodium chloride   flush  10 mL Intrapleural Q8H   sodium chloride  flush  10-40 mL Intracatheter Q12H   Continuous Infusions:  feeding supplement (KATE FARMS STANDARD ENT 1.4) 65 mL/hr at 02/23/24 0604   piperacillin -tazobactam (ZOSYN )  IV Stopped (02/23/24 0602)   promethazine (PHENERGAN) injection (IM or IVPB)     TPN ADULT (ION) 35 mL/hr at 02/23/24 0604   PRN Meds:.acetaminophen  (TYLENOL ) oral liquid 160 mg/5 mL, diclofenac Sodium, diphenhydrAMINE **OR** diphenhydrAMINE, diphenoxylate-atropine **FOLLOWED BY** [START ON 02/24/2024] diphenoxylate-atropine, hydrALAZINE , levalbuterol, lip balm, metoCLOPramide (REGLAN) injection, naloxone  **AND** sodium chloride  flush, mouth rinse, oxyCODONE , promethazine (PHENERGAN) injection (IM or IVPB), sodium chloride   flush  Antimicrobials: Anti-infectives (From admission, onward)    Start     Dose/Rate Route Frequency Ordered Stop   02/22/24 1844  fluconazole (DIFLUCAN) tablet 200 mg        200 mg Per Tube Daily 02/22/24 1844 03/04/24 0959   02/22/24 1630  fluconazole (DIFLUCAN) tablet 200 mg  Status:  Discontinued        200 mg Oral Daily 02/22/24 1537 02/22/24 1844   02/13/24 0900  piperacillin -tazobactam (ZOSYN ) IVPB 3.375 g        3.375 g 12.5 mL/hr over 240 Minutes Intravenous Every 8 hours 02/13/24 0812     02/07/24 0800  vancomycin (VANCOCIN) IVPB 1000 mg/200 mL premix  Status:  Discontinued        1,000 mg 200 mL/hr over 60 Minutes Intravenous Every 24 hours 02/07/24 0632 02/09/24 0905   02/06/24 2200  meropenem (MERREM) 1 g in sodium chloride  0.9 % 100 mL IVPB  Status:  Discontinued        1 g 200 mL/hr over 30 Minutes Intravenous Every 12 hours 02/06/24 1127 02/13/24 0812   02/06/24 1300  vancomycin (VANCOREADY) IVPB 1750 mg/350 mL  Status:  Discontinued        1,750 mg 175 mL/hr over 120 Minutes Intravenous Every 48 hours 02/04/24 1225 02/04/24 1226   02/06/24 0800  vancomycin (VANCOCIN) IVPB 1000 mg/200 mL premix        1,000 mg 200 mL/hr over 60 Minutes Intravenous  Once 02/06/24 0629 02/07/24 0829   02/04/24 1400  meropenem (MERREM) 1 g in sodium chloride  0.9 % 100 mL IVPB  Status:  Discontinued        1 g 200 mL/hr over 30 Minutes Intravenous Every 8 hours 02/04/24 1208 02/06/24 1127   02/04/24 1300  linezolid  (ZYVOX ) IVPB 600 mg  Status:  Discontinued        600 mg 300 mL/hr over 60 Minutes Intravenous Every 12 hours 02/04/24 1202 02/04/24 1208   02/04/24 1300  vancomycin (VANCOREADY) IVPB 2000 mg/400 mL        2,000 mg 200 mL/hr over 120 Minutes Intravenous  Once 02/04/24 1208 02/04/24 1623   02/04/24 1208  vancomycin variable dose per unstable renal function (pharmacist dosing)  Status:  Discontinued         Does not apply See admin instructions 02/04/24 1208 02/07/24 0632    01/31/24 1030  fluconazole (DIFLUCAN) IVPB 200 mg  Status:  Discontinued        200 mg 100 mL/hr over 60 Minutes Intravenous Daily 01/31/24 0938 02/20/24 1218   01/30/24 1445  piperacillin -tazobactam (ZOSYN ) IVPB 3.375 g  Status:  Discontinued        3.375 g 12.5 mL/hr over 240 Minutes Intravenous Every 8 hours 01/30/24 1354 02/04/24 1203   01/26/24 1000  amoxicillin-clavulanate (AUGMENTIN) 400-57 MG/5ML suspension 875 mg  Status:  Discontinued  875 mg Oral Every 12 hours 01/26/24 0658 01/30/24 1354   01/26/24 1000  fluconazole (DIFLUCAN) 40 MG/ML suspension 200 mg  Status:  Discontinued        200 mg Oral Daily 01/26/24 0658 01/31/24 0938   01/22/24 1445  fluconazole (DIFLUCAN) IVPB 400 mg  Status:  Discontinued        400 mg 100 mL/hr over 120 Minutes Intravenous Every 24 hours 01/22/24 1436 01/22/24 1439   01/22/24 1445  fluconazole (DIFLUCAN) IVPB 200 mg  Status:  Discontinued        200 mg 100 mL/hr over 60 Minutes Intravenous Every 24 hours 01/22/24 1439 01/26/24 0658   01/21/24 2200  piperacillin -tazobactam (ZOSYN ) IVPB 3.375 g  Status:  Discontinued       Placed in Followed by Linked Group   3.375 g 12.5 mL/hr over 240 Minutes Intravenous Every 8 hours 01/21/24 1607 01/26/24 0658   01/21/24 1615  piperacillin -tazobactam (ZOSYN ) IVPB 3.375 g       Placed in Followed by Linked Group   3.375 g 100 mL/hr over 30 Minutes Intravenous  Once 01/21/24 1607 01/21/24 1651        CBC: Recent Labs  Lab 02/18/24 0521 02/19/24 0500 02/20/24 0617 02/22/24 0441 02/23/24 0403  WBC 16.9* 8.9 10.7* 12.2* 11.1*  HGB 12.8* 11.5* 11.4* 12.7* 12.6*  HCT 40.5 36.4* 35.8* 40.5 40.3  MCV 88.0 87.5 87.7 88.6 88.4  PLT 311 242 220 259 235   BMP &GFR Recent Labs  Lab 02/18/24 0521 02/19/24 0500 02/20/24 0617 02/21/24 0632 02/21/24 0633 02/22/24 0441 02/23/24 0403  NA 139 141  --   --  143 142 144  K 4.2 4.2  --   --  4.2 4.3 4.0  CL 102 103  --   --  103 103 101  CO2 23 26   --   --  25 26 24   GLUCOSE 111* 135*  --   --  145* 114* 141*  BUN 74* 80*  --   --  79* 93* 106*  CREATININE 2.18* 2.20*  --   --  2.01* 2.51* 2.71*  CALCIUM  9.5 9.5  --   --  9.5 9.2 9.0  MG 2.4 2.5* 2.4 2.1  --  2.1 2.2  PHOS 4.1 4.0  --   --  3.1 3.5 3.8   Estimated Creatinine Clearance: 24.2 mL/min (A) (by C-G formula based on SCr of 2.71 mg/dL (H)).  Liver & Pancreas: Recent Labs  Lab 02/18/24 0320 02/18/24 0521 02/19/24 0500 02/21/24 0633 02/22/24 0441  AST  --   --  12*  --  12*  ALT  --   --  15  --  15  ALKPHOS  --   --  72  --  70  BILITOT  --   --  0.5  --  0.5  PROT  --   --  7.1  --  6.8  ALBUMIN  1.8* 2.1* 1.9* 2.0* 2.0*    Recent Labs  Lab 02/22/24 1618 02/22/24 2005 02/22/24 2329 02/23/24 0259 02/23/24 0732  GLUCAP 118* 143* 136* 141* 146*     Microbiology: No results found for this or any previous visit (from the past 240 hours).   Radiology Studies: No results found.  Eddith Mentor 02/23/2024  If 7PM-7AM, please contact night-coverage [email or web address] 02/23/2024, 10:00 AM

## 2024-02-23 NOTE — Progress Notes (Signed)
 Nutrition Follow-up  DOCUMENTATION CODES:   Severe malnutrition in context of acute illness/injury  INTERVENTION:  Continue TPN to meet 50% estimated nutritional needs as TF meeting 66% estimated calorie and 61% protein needs -Continue MVI w/ trace elements added to TPN -Titrate TPN back up if patient begins to show s/sx of intolerance    Continue TF via PEG: -Reduce [employer] 1.4 to 45 ml/h (1,080 ml per day)             - Increase Banatrol to TID d/t diarrhea-provides 5g soluble fiber per serving -Provides 1512 kcal, 67 gm protein, 777 ml free water  daily   If pt establishes tolerance to enteral nutrition recommend titration of  -[employer]ms 1.4 to 65 ml/h (1560 ml per day) titrate up by 10ml/hr q12h until new goal rate achieved -Prosource TF20 60ml daily -Recommend low volume FWF when able - Add MVI w/ minerals when TPN stopped             - Banatrol TID d/t diarrhea-provides 45kcal, 5g soluble fiber per serving -Provides 2264 kcal, 117 gm protein, 1108 ml free water  daily   NUTRITION DIAGNOSIS:   Severe Malnutrition related to acute illness (may have chronic component due to time frame of reported wt loss) as evidenced by severe fat depletion, moderate muscle depletion. - Ongoing  GOAL:   Patient will meet greater than or equal to 90% of their needs - Meeting via TPN and TF  MONITOR:   Diet advancement, Labs, Weight trends, I & O's, Skin (TPN)  REASON FOR ASSESSMENT:   Consult Enteral/tube feeding initiation and management  ASSESSMENT:   73 yo male admitted post several days of N/V followed by sudden onset of severe epigastric pain radiating to chest and back. Pt found to have small esophageal perforation above GE junction. PMH includes CAD, chronic HFpEF, HTN, HLD, DVT, CVA, PAF, CKD 3b.  10/05 Admitted 10/06 Esophagogram/barium study: distal esophageal perforation, TPN started 10/07 S/p Esophageal stent, TPN at goal rate of 80 ml/hr 10/08  CLD 10/10 Diet  advanced to Dysphagia 3, Calorie Count 10/13 No meal tickets for calorie count, pt reports eating bites only 10/15 Esophageal stent migration with leak on esophagram with return to OR for stent repositioning, likely aspiration event requiring intubation 10/16 Extubated, Chest tube placed 10/17 Limited echo with EF 50-55%, RV mildly reduced 10/20 Intubated early AM, L pleural effusion, pigtail chest tube inserted 10/23 - extubated; limited echo: 60-65% 10/24 - esophagram: persistent leak 10/28 - transfer out of ICU 11/03 - PEG tube placement  11/04 - TF started 11/5 - TF at 20 ml/hr 11/6 - TPN decreased to meet 50% of pt's needs, TF at 50 ml/hr,  11/7 - TF at 60 ml/hr, multiple loose BM, decrease rate of tube feeds to 45 ml/hr, continue TPN to meet 50% of needs  Saw pt in conjunction with RN and pharmacy, pt only up at goal rate of 60 ml/hr this morning. Pt started having multiple loose BM documented as 4 yesterday however unsure if documentation is accurate. This morning pt already had 2 loose BM type 7 per RN. RN also reports prior to tube feeds being started pt was having type 7 BM x 2 per day which seems to be an ongoing issue. Has mild nausea, no vomiting.   Decrease tube feeding rate and increase Banatrol to TID d/t loose stool. Continue TPN to meet 50% of pt's needs until tolerance of enteral nutrition is established. Pharmacy added Florastor BID. Pt  has not had any GI stimulation for over 1 month, hopefully with time his gut will adjust. Monitor tolerance of tube feeds and adjust as able. Monitor BM, notified nursing to be consistent in documentation. MD added Lomotil today, also on Imodium and Banatrol.   Admit weight: 72.6 kg Current weight: 68.5 kg   Average Meal Intake: NPO  Nutritionally Relevant Medications: Scheduled Meds:  fiber supplement (BANATROL TF)  60 mL Per Tube TID   saccharomyces boulardii  250 mg Per Tube BID   Continuous Infusions:  feeding supplement (KATE  FARMS STANDARD ENT 1.4)     piperacillin -tazobactam (ZOSYN )  IV Stopped (02/23/24 0602)   promethazine (PHENERGAN) injection (IM or IVPB)     TPN ADULT (ION) 35 mL/hr at 02/23/24 0604   TPN ADULT (ION)     Labs Reviewed: BUN 106 Creatinine 2.71 AST 12 GFR 24 CBG ranges from 118-146 mg/dL over the last 24 hours HgbA1c 6.0  Diet Order:   Diet Order             Diet NPO time specified  Diet effective now                   EDUCATION NEEDS:   Education needs have been addressed  Skin:  Skin Assessment: Skin Integrity Issues: Skin Integrity Issues:: Stage I Stage I: buttocks, bilateral feet  Last BM:  11/6 - type 7 x 2 in AM, 11/5 - type 7 x 4,  Height:   Ht Readings from Last 1 Encounters:  02/07/24 6' 1 (1.854 m)    Weight:   Wt Readings from Last 1 Encounters:  02/23/24 68.5 kg    Ideal Body Weight:  83.6 kg  BMI:  Body mass index is 19.92 kg/m.  Estimated Nutritional Needs:   Kcal:  2300-2500 kcals  Protein:  110-130 g  Fluid:  2L   Olivia Kenning, RD Registered Dietitian  See Amion for more information

## 2024-02-23 NOTE — Plan of Care (Signed)

## 2024-02-23 NOTE — Progress Notes (Addendum)
      [address] [place] [address]             [phone]    I received a call reporting the patient accidentally pulled out his right sided chest tube. Upon arrival the site was dressed. When removing the dressing there was some erythema and purulent discharge from the previous chest tube site. The site was redressed with petroleum dressing and the raw skin sites due to tape were avoided as much as possible. The patient is on IV Zosyn  which should cover him for possible infection of chest tube site. I have reached out to Dr. Shyrl to determine if we need to replace chest tube, awaiting response. Patient is stable without signs of distress and oxygenating well.   Addendum: Will not replace right sided chest tube as discussed with Dr. Shyrl Con GORMAN Raguel, PA-C 02/23/24

## 2024-02-23 NOTE — Progress Notes (Addendum)
 Advanced Heart Failure Rounding Note  Cardiologist: Dorn Lesches, MD  Chief Complaint: S/p esophageal stent Subjective:    - 10/6: esoph stent placement - 10/15: S/p EGD with stent repositioning by Dr. Shyrl  - 10/23: Ltd echo EF 60-65%  - 10/24: Barium swallow showed persistent leak  - 11/3: PEG tube placed  BP improved, intermittently elevated. sCr still rising. Suspect dry + ARNI use.   Sitting up in bed. In better spirits this morning. Strength seems to be improving.   Objective:    Weight Range: 68.5 kg Body mass index is 19.92 kg/m.   Vital Signs:   Temp:  [98 F (36.7 C)-98.4 F (36.9 C)] 98 F (36.7 C) (11/07 1121) Pulse Rate:  [70-74] 71 (11/07 1121) Resp:  [12-18] 15 (11/07 1144) BP: (110-156)/(59-67) 156/67 (11/07 1121) SpO2:  [95 %-100 %] 100 % (11/07 1121) Weight:  [68.5 kg] 68.5 kg (11/07 0330) Last BM Date : 02/22/24  Weight change: Filed Weights   02/20/24 0500 02/22/24 0434 02/23/24 0330  Weight: 71.4 kg 66.8 kg 68.5 kg   Intake/Output:  Intake/Output Summary (Last 24 hours) at 02/23/2024 1221 Last data filed at 02/23/2024 1100 Gross per 24 hour  Intake 1532.27 ml  Output 2225 ml  Net -692.73 ml    Physical Exam   General: acutely-ill appearing. No distress on [name]: JVP flat. S1 and S2 present. No murmurs Extremities: Warm and dry.  No peripheral edema.  Neuro: Alert and oriented x3. Affect pleasant. Moves all extremities without difficulty.  Telemetry   SR 60-70s (personally reviewed)  Labs    CBC Recent Labs    02/22/24 0441 02/23/24 0403  WBC 12.2* 11.1*  HGB 12.7* 12.6*  HCT 40.5 40.3  MCV 88.6 88.4  PLT 259 235   Basic Metabolic Panel Recent Labs    88/93/74 0441 02/23/24 0403  NA 142 144  K 4.3 4.0  CL 103 101  CO2 26 24  GLUCOSE 114* 141*  BUN 93* 106*  CREATININE 2.51* 2.71*  CALCIUM  9.2 9.0  MG 2.1 2.2  PHOS 3.5 3.8   Liver Function Tests Recent Labs    02/21/24 0633 02/22/24 0441   AST  --  12*  ALT  --  15  ALKPHOS  --  70  BILITOT  --  0.5  PROT  --  6.8  ALBUMIN  2.0* 2.0*   BNP (last 3 results) Recent Labs    03/09/23 1544 03/11/23 0218 06/27/23 1116  BNP 660.9* 491.3* 646.4*   Hemoglobin A1C No results for input(s): HGBA1C in the last 72 hours.  Fasting Lipid Panel No results for input(s): CHOL, HDL, LDLCALC, TRIG, CHOLHDL, LDLDIRECT in the last 72 hours.   Medications:    Scheduled Medications:  amiodarone   200 mg Per Tube BID   apixaban   5 mg Per Tube BID   bisacodyl   10 mg Rectal Q0600   Chlorhexidine  Gluconate Cloth  6 each Topical Daily   diphenoxylate-atropine  1 tablet Per Tube QID   fiber supplement (BANATROL TF)  60 mL Per Tube TID   fluconazole  200 mg Per Tube Daily   [name] butt cream   Topical BID   HYDROmorphone    Intravenous Q4H   insulin  aspart  0-9 Units Subcutaneous Q4H   lidocaine   1 patch Transdermal Q24H   pantoprazole  (PROTONIX ) IV  40 mg Intravenous Q12H   saccharomyces boulardii  250 mg Per Tube BID   sodium chloride  flush  10 mL Intrapleural Q8H  sodium chloride  flush  10-40 mL Intracatheter Q12H    Infusions:  feeding supplement ([facility] STANDARD ENT 1.4) 1,000 mL (02/23/24 1143)   piperacillin -tazobactam (ZOSYN )  IV Stopped (02/23/24 0602)   promethazine (PHENERGAN) injection (IM or IVPB)     TPN ADULT (ION) 35 mL/hr at 02/23/24 0604   TPN ADULT (ION)      PRN Medications: acetaminophen  (TYLENOL ) oral liquid 160 mg/5 mL, diclofenac Sodium, diphenhydrAMINE **OR** diphenhydrAMINE, diphenoxylate-atropine **FOLLOWED BY** [START ON 02/24/2024] diphenoxylate-atropine, hydrALAZINE , levalbuterol, lip balm, metoCLOPramide (REGLAN) injection, naloxone  **AND** sodium chloride  flush, mouth rinse, oxyCODONE , promethazine (PHENERGAN) injection (IM or IVPB), sodium chloride  flush  Patient Profile    Patient with longstanding history of chronic heart failure with preserved ejection fraction, RV failure  with history of pulmonary embolism presents with esophageal perforation.  S/P Stent Esophageal Stent. Stent repositioned 10/15.  Assessment/Plan   1.  Esophageal Perforation - Boerhaave syndrome.  - CT C/A/P: with pneumomediastinum and large mass-like density in lower esophagus - 10/6: EGD with esophageal stent placed  - 10/15: Stent migrated w/ leak > back to OR for repositioning  - 10/24: barium swallow w persistent leak  - 11/3: PEG placed - on TF and meds by PEG - continues on fluconazole with stent in place  2. Acute on chronic HFpEF: h/o cardiogenic shock with RV failure in the setting of cardiac arrest thought to be caused by acute PE vs ACS in 3/25. Echo at the time showed EF 55%, severe, LVH, G2DD, and severely reduced RV function with strain. There was concern for cardiac sarcoid amyloid, however CMR LGE consistent with ischemic disease. RV on echo 9/25 with complete recovery, EF 60-65%, nl RV function.    - RHC 3/25: Mild PAH with severe RV failure though CO is preserved.   - Repeat echo 10/25 EF 60-65% - Appears hypovolemic, hold diuretics - stop entresto  with rising Cr - add hydral 25 mg tid + imdur  30 mg daily - Continue TED hose  3. 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 Medical management.  - suspect CP related to esophageal issues and not CAD - No s/s angina   4.  AKI on CKD Stage 3b: Baseline sCr ~ 2.1. - Bumped creatinine to 2.7 - stop ARNI, hold diuretics  5. PAF:   - s/p TEE/DCCV 3/25 - has converted to AF a couple times this admit - continue amio 200 mg bid - continue eliquis  5 mg bid - in NSR on tele    6.  H/o DVT/PE:  Bilateral upper and lower DVTs, mixed acute and chronic.  - s/p infrarenal IVC placement 3/25 - V/Q 3/25 with multiple perfusion defects - continue eliquis    7. Carotid stenosis: h/o CVA. TCAR in 6/24. Okay to remain off plavix  at this point. - carotid US  9/25:  widely patent carotid stent on R and no stenosis on L   8. Acute Hypoxic Respiratory Failure/PNA/ Exudative Lt Pleural Effusion  - on zosyn   - CT placed for pleural effusion. Resp Cx w/ rare yeast, BCx NGTD  - remains on [name] - [name] managing  9. Severe protein calorie malnutrition - Nutrition following. On TPN qHS - now with PEG tube + TF  10. HTN - med changes as abobe  Billy Lee, Billy [name] 02/23/2024  Advanced Heart Failure Team Pager [phone] (M-F; 7a - 5p)  Please contact [facility] for night-coverage after hours (4p -7a ) and weekends  on [email or web address]  Patient seen with PA/Billy [name], agree with the above note.   Subjective:   Sitting up in bed, better spirits today, scheduled immodium and improved from that standpoint.   Exam: General: chronically ill appearing Lungs: Clear to auscultation bilaterally with normal respiratory effort. CV: Heart regular S1/S2, no peripheral edema Abdomen: Soft, no distention.  Neurologic: awake/alert, no gross FND.    A/P  Noted to have worsening creatinine today, corresponds with restarting entresto  and coming down off TPN. Would recommend holding entresto , can start hydaralzine 25mg  TID and isordil  30mg  daily for BP support and titrate up over the weekend as needed. Otherwise appears to be slowly improving. HF will round again on Monday, please reach out with any further questions.   Billy [name] Advanced Heart Failure

## 2024-02-24 ENCOUNTER — Inpatient Hospital Stay (HOSPITAL_COMMUNITY)

## 2024-02-24 DIAGNOSIS — R918 Other nonspecific abnormal finding of lung field: Secondary | ICD-10-CM | POA: Diagnosis not present

## 2024-02-24 DIAGNOSIS — I700 Atherosclerosis of aorta: Secondary | ICD-10-CM | POA: Diagnosis not present

## 2024-02-24 DIAGNOSIS — K223 Perforation of esophagus: Secondary | ICD-10-CM | POA: Diagnosis not present

## 2024-02-24 DIAGNOSIS — J90 Pleural effusion, not elsewhere classified: Secondary | ICD-10-CM | POA: Diagnosis not present

## 2024-02-24 DIAGNOSIS — Z452 Encounter for adjustment and management of vascular access device: Secondary | ICD-10-CM | POA: Diagnosis not present

## 2024-02-24 LAB — CBC
HCT: 38.2 % — ABNORMAL LOW (ref 39.0–52.0)
Hemoglobin: 12 g/dL — ABNORMAL LOW (ref 13.0–17.0)
MCH: 27.8 pg (ref 26.0–34.0)
MCHC: 31.4 g/dL (ref 30.0–36.0)
MCV: 88.6 fL (ref 80.0–100.0)
Platelets: 250 K/uL (ref 150–400)
RBC: 4.31 MIL/uL (ref 4.22–5.81)
RDW: 19.2 % — ABNORMAL HIGH (ref 11.5–15.5)
WBC: 10.7 K/uL — ABNORMAL HIGH (ref 4.0–10.5)
nRBC: 0 % (ref 0.0–0.2)

## 2024-02-24 LAB — BASIC METABOLIC PANEL WITH GFR
Anion gap: 15 (ref 5–15)
BUN: 115 mg/dL — ABNORMAL HIGH (ref 8–23)
CO2: 26 mmol/L (ref 22–32)
Calcium: 8.9 mg/dL (ref 8.9–10.3)
Chloride: 102 mmol/L (ref 98–111)
Creatinine, Ser: 2.98 mg/dL — ABNORMAL HIGH (ref 0.61–1.24)
GFR, Estimated: 22 mL/min — ABNORMAL LOW (ref >60)
Glucose, Bld: 145 mg/dL — ABNORMAL HIGH (ref 70–99)
Potassium: 4.3 mmol/L (ref 3.5–5.1)
Sodium: 143 mmol/L (ref 135–145)

## 2024-02-24 LAB — GLUCOSE, CAPILLARY
Glucose-Capillary: 110 mg/dL — ABNORMAL HIGH (ref 70–99)
Glucose-Capillary: 114 mg/dL — ABNORMAL HIGH (ref 70–99)
Glucose-Capillary: 118 mg/dL — ABNORMAL HIGH (ref 70–99)
Glucose-Capillary: 125 mg/dL — ABNORMAL HIGH (ref 70–99)
Glucose-Capillary: 146 mg/dL — ABNORMAL HIGH (ref 70–99)
Glucose-Capillary: 148 mg/dL — ABNORMAL HIGH (ref 70–99)

## 2024-02-24 LAB — MAGNESIUM: Magnesium: 2.3 mg/dL (ref 1.7–2.4)

## 2024-02-24 LAB — PHOSPHORUS: Phosphorus: 4.4 mg/dL (ref 2.5–4.6)

## 2024-02-24 MED ORDER — TRAVASOL 10 % IV SOLN
INTRAVENOUS | Status: AC
  Filled 2024-02-24: qty 430

## 2024-02-24 MED ORDER — SODIUM CHLORIDE (PF) 0.9 % IJ SOLN
INTRAMUSCULAR | Status: AC
  Administered 2024-02-24: 10 mL
  Filled 2024-02-24: qty 10

## 2024-02-24 NOTE — Plan of Care (Signed)
   Problem: Health Behavior/Discharge Planning: Goal: Ability to manage health-related needs will improve Outcome: Progressing   Problem: Clinical Measurements: Goal: Ability to maintain clinical measurements within normal limits will improve Outcome: Progressing

## 2024-02-24 NOTE — Plan of Care (Signed)
   Problem: Education: Goal: Knowledge of General Education information will improve Description Including pain rating scale, medication(s)/side effects and non-pharmacologic comfort measures Outcome: Progressing

## 2024-02-24 NOTE — Progress Notes (Signed)
 PHARMACY - TOTAL PARENTERAL NUTRITION CONSULT NOTE   Indication: intolerance to enteral feedings, esophageal perforation  Patient Measurements: Height: 6' 1 (185.4 cm) Weight: 68.2 kg (150 lb 5.7 oz) IBW/kg (Calculated) : 79.9 TPN AdjBW (KG): 80.6 Body mass index is 19.84 kg/m.  Assessment:  73 year old man with medical history significant for cardiac arrest earlier this year who was admitted after several days of nausea and vomiting followed by sudden onset severe tearing epigastric pain radiating to back and chest. 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 Pharmacy consulted to initiate TPN due to inability to tolerate enteral nutrition at this time.  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 PTA takes torsemide  20 mg PO daily, now held. Aggressive fluid resuscitation recommended per GI. Per HF, patient currently euvolemic but will hold diuretic for now. Due to CHF will target lower end of fluid range for TPN and supplement additional fluids per MD outside of TPN to prevent fluid overload.   Stent placed on 10/6, esophogram not showing any leak. Diet advanced to CLD. 10/15 Leak present and made NPO. 10/24 esophagram with persistent leak; will need stent replacement in 3-4 weeks.   Glucose / Insulin : A1C 6.0% (02/2023);  CBG 145-152, 8 units SSI used in 24h No insulin  in TPN, removed on 11/6  Electrolytes: Na 143, K 4.3 (4.0), Cl 102, CO2 26, Ca 8.9 [CoCa 10.5], 11/7: phos 4.4, mg: 2.3 Renal: SCr increasing 2.98 (BL ~2), BUN up 115 Hepatic: 11/6- AST 12 /ALT 15 /tbili 0.5 /alk phos 72, alb 2, TG 56 (11/3)  Intake / Output; MIVF: furosemide  > torsemide  stopped 11/6; UOP 0.9 mL/kg/hr, chest tube 0 ml, stool occurrence x1 on 11/7, bisacodyl  daily (held), fiber, florastor  Net IO Since Admission: 2,208.05 mL [02/24/24 0805]  GI Imaging: 10/05 CT: concerning for esophageal perforation, masslike area of soft tissue thickening adjacent to and  surrounding the esophagus  10/06 Esoph XR: distal esophageal perforation 10/7 Esoph XR: No leak associated with stent coverage of esophageal perforation 10/14 KUB: Gas-filled loops of small bowel and colon suggestive of ileus 10/15 Esoph XR: esophageal perforation/leak at the distal esophagus, proximal to the esophageal stent 10/24 esophagram: persistent leak despite being covered by the esophageal stent.  GI Surgeries / Procedures:  10/6: esophageal stent placement  10/15 EGD and stent repositioning   Central access: 01/22/24 TPN start date: 01/22/24  RD Assessment:  Estimated Needs Total Energy Estimated Needs: 2300-2500 kcals Total Protein Estimated Needs: 110-130 g Total Fluid Estimated Needs: 2L  Current Nutrition:  TPN/NPO  10/13- 10/14 DYS1: not taking any po  10/15: NPO d/t increased abd distension  10/22 Propofol  at 7.6- 20.2 ml/hr, providing ~370 kcals/24hr- OFF 10/23: switch propofol  back to dexmedetomidine - OFF 11/4: Mallie Pinion started 20 ml/hr, goal 60 ml/hr 11/5 Mallie Pinion goal 65 ml/hr  11/6 Mallie Pinion at 40 ml/hr (1344 kcal) - tolerated per RN 11/7 Mallie Pinion at 60 ml/hr but decreasing to 45 ml/hr d/t diarrhea 11/8 Mallie Pinion 39mL/hr (1512 kcal and 67g protein)   Plan:  Un-concentrate TPN, patient with noted AKI suspected to be due to hypovolemia.  Will continue TPN at 35mL/hr, providing 43g protein and 847kcal. When combined with Mallie Pinion @ 68mL/hr (1512 kcal and 67g protein), patient will be getting 2359 kcal and 110g protein meeting 100% of nutritional needs.  Electrolytes in TPN: Na 14 mEq/L, reduce K 30 mEq/L (empiric reduction given increasing Scr and BUN), Ca 0 mEq/L, remove Mg 0  mEq/L, reduce Phos 5 mmol/L. Cl:Ac max acetate Continue standard MVI and trace elements to TPN Monitor TPN labs on Mon/Thurs, BMET in am and PRN. Repeat in AM given AKI.  F/u TF tolerability and ability to wean TPN once tube feeds at goal Closely monitor fluid status for need to  re-concentrate TPN.   Thank you for involving pharmacy in this patient's care.  Powell Blush, PharmD, BCCCP  Clinical Pharmacist Clinical phone for 02/24/2024 is [phone] 02/24/2024 8:05 AM

## 2024-02-24 NOTE — Progress Notes (Signed)
 PROGRESS NOTE  Billy [name] FMW:[id number] DOB: 15-Sep-1950   PCP: Seabron Lenis, MD  Patient is from: Home.  DOA: 01/21/2024 LOS: 34   Brief Narrative / Interim history: 73 year old M with PMH of cardiac arrest earlier this year along with HFrEF, Afib, PE presented with several days of nausea and vomiting from a stomach bug followed by sudden onset severe tearing epigastric pain radiating to back and chest. Workup revealed esophageal tear/Boerhaave. Patient had esophageal stent placed on 10/7.  Subsequent swallow study revealed an additional leak, and he had stent repositioned on 01/31/2024.  He became hypoxic with gastric contents noted on intubation, and he was transferred to ICU for mechanical ventilation.  Bedside ultrasound with right pleural effusion for which he had chest tube placed with removal of 300 cc exudative fluid.  He was also successfully extubated on 10/16.  Patient has had increased oxygen  requirement requiring reintubation on 10/20.  Bedside ultrasound showed left-sided pleural effusion and he had left-sided chest tube with 120 cc exudative output. ICU stay complicated by high O2 requirement, left sided pleural effusion, chest tube placement 10/16, reintubated 10/20.  Eventually, patient was extubated and transferred out of ICU on 10/28. Hospital course complicated by acute on chronic HFpEF for which he is on IV Lasix  per advanced heart failure team. PEG tube placed on 11/3.  Dietitian consulted for initiation of tube feed.  Remains on TPN until tube feed at goal.  Subjective: Patient was seen briefly this morning.  At the same time he radiology came to do a portable chest x-ray.  Another attempt to see the patient was made however patient was getting a bath.  Will try to see the patient later today.     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  RD consulted for initiation of TF -Continue TPN until TF at goal Patient had bilateral chest tubes.  He accidentally pulled out the right chest tube on 11/7.  Patient was seen by cardiothoracic surgery.  They are leaving the tube out.  He continues to have left-sided chest tube.  Cardiothoracic is managing.  Was found to have some erythema and purulent drainage from the right sided chest tube site.  He remains on Zosyn .  Duration of antibiotics per cardiothoracic.  Acute hypoxic/ hypercapnic respiratory failure- Multifactorial including aspiration pneumonia, pleural effusion and CHF.  Trach aspirate on 10/15 with Candida albicans. -IV meropenem>> IV Zosyn  for pneumonia per CTS. -Diflucan for Candida albicans from trach aspirate from 10/15-11/4. -Continue bronchodilators Respiratory status is stable.   Acute on chronic diastolic CHF Limited TTE on 10/23 with LVEF of 60 to 65%.  RHC in 3/25 with mild PAH with severe RV failure but preserved CO.  Appears euvolemic on exam. -Monitor fluid and respiratory status, renal functions, electrolytes. Heart failure team has been following.  Patient was started on Entresto .  Torsemide  was discontinued due to increasing creatinine.  Increasing creatinine noted again today.  Await heart failure team input.   AKI on CKD-3B:  Baseline Cr~2.0.  Renal function had improved but creatinine has been gradually rising for the past few days.  Initially patient's diuretics were held.  Entresto  was held yesterday.  Continue to monitor labs daily for now.   Paroxysmal A-fib:  s/p TEE DCCV 3/25.  Seems to be in sinus rhythm. Transitioned to p.o. amiodarone  and Eliquis   Intractable pain Required ketamine and started on IV Dilaudid  PCA while in ICU. Decreasing PCA with a goal to wean off or changed to as needed Dilaudid . Patient on as needed oxycodone  as well.  H/o PE/ DVT-  S/p infrarenal  IVC placement 3/25. V/Q 3/25 with multiple perfusion defects Now on Eliquis .  Essential hypertension:  Blood pressure is reasonably well-controlled.  Patient is currently on hydralazine .  Nausea and diarrhea Persistent nausea and diarrhea.  Likely due to tube feedings.  Zofran  was changed over to Phenergan.  Imodium was changed over to Lomotil.  Diarrhea seems to have slowed down.  Continue to monitor.  Abdominal examination is benign.  Do not suspect infectious etiology.    Unwitnessed fall in the hospital:  Found down on the floor the evening of 10/31.  No apparent injury or focal neurodeficit. - Fall precaution  Anxiety disorder: Stable. -Ativan  as needed   Acute on chronic anemia -Monitor H&H.   Leukocytosis: Resolved.  GERD - IV PPI.  Goal of care: Noted DNR paperwork in [facility] but patient confirms full CODE STATUS.SABRA   Severe protein calorie malnutrition/hypoalbuminemia Body mass index is 19.84 kg/m. Nutrition Problem: Severe Malnutrition Etiology: acute illness (may have chronic component due to time frame of reported wt loss) Signs/Symptoms: severe fat depletion, moderate muscle depletion Interventions: Refer to RD note for recommendations Patient was started on TPN.  G-tube was subsequently placed.  Now on tube feedings.  TPN being weaned down gradually.  Pressure skin injury Wound 02/07/24 0900 Pressure Injury Foot Anterior;Right Stage 1 -  Intact skin with non-blanchable redness of a localized area usually over a bony prominence. (Active)     Wound 02/07/24 0900 Pressure Injury Foot Anterior;Left Stage 1 -  Intact skin with non-blanchable redness of a localized area usually over a bony prominence. (Active)     Wound 02/07/24 0900 Pressure Injury Buttocks Right Stage 1 -  Intact skin with non-blanchable redness of a localized area usually over a bony prominence. (Active)   DVT prophylaxis: On Eliquis  now Code Status: Full code Family Communication: None at bedside  today. Disposition:  SNF when medically stable  Consultants:  Gastroenterology Critical care Cardiology Cardiothoracic surgery  Microbiology summarized: 10/5-MRSA PCR screen positive 10/15-trach aspirate culture with Candida albicans.  Objective: Vitals:   02/24/24 0416 02/24/24 0552 02/24/24 0751 02/24/24 0800  BP:   (!) 133/47   Pulse:   67   Resp: 16  17 15   Temp:   97.6 F (36.4 C)   TempSrc:   Oral   SpO2:      Weight:  68.2 kg    Height:        Examination:  Lying comfortably on the bed.  No distress noted.  Limited exam since patient was getting a chest x-ray initially and then get a bath by nursing staff.    Sch Meds:  Scheduled Meds:  amiodarone   200 mg Per Tube BID   apixaban   5 mg Per Tube BID   bisacodyl   10 mg Rectal Q0600   Chlorhexidine  Gluconate Cloth  6 each Topical Daily   fiber supplement (BANATROL TF)  60 mL Per Tube TID   fluconazole  200 mg Per Tube Daily   [name] butt cream   Topical BID   hydrALAZINE   25 mg Per Tube Q8H   HYDROmorphone    Intravenous Q4H   insulin  aspart  0-9 Units Subcutaneous Q4H   lidocaine   1 patch Transdermal Q24H   pantoprazole  (PROTONIX ) IV  40  mg Intravenous Q12H   saccharomyces boulardii  250 mg Per Tube BID   sodium chloride  flush  10 mL Intrapleural Q8H   sodium chloride  flush  10-40 mL Intracatheter Q12H   Continuous Infusions:  feeding supplement (KATE [employer]ARMS STANDARD ENT 1.4) 1,000 mL (02/24/24 0941)   piperacillin -tazobactam (ZOSYN )  IV 3.375 g (02/24/24 0543)   promethazine (PHENERGAN) injection (IM or IVPB) 150 mL/hr at 02/23/24 1819   TPN ADULT (ION) 35 mL/hr at 02/23/24 1825   PRN Meds:.acetaminophen  (TYLENOL ) oral liquid 160 mg/5 mL, diclofenac Sodium, diphenhydrAMINE **OR** diphenhydrAMINE, [COMPLETED] diphenoxylate-atropine **FOLLOWED BY** diphenoxylate-atropine, hydrALAZINE , levalbuterol, lip balm, metoCLOPramide (REGLAN) injection, naloxone  **AND** sodium chloride  flush, mouth rinse, oxyCODONE ,  promethazine (PHENERGAN) injection (IM or IVPB), sodium chloride  flush  Antimicrobials: Anti-infectives (From admission, onward)    Start     Dose/Rate Route Frequency Ordered Stop   02/22/24 1844  fluconazole (DIFLUCAN) tablet 200 mg        200 mg Per Tube Daily 02/22/24 1844 03/04/24 0959   02/22/24 1630  fluconazole (DIFLUCAN) tablet 200 mg  Status:  Discontinued        200 mg Oral Daily 02/22/24 1537 02/22/24 1844   02/13/24 0900  piperacillin -tazobactam (ZOSYN ) IVPB 3.375 g        3.375 g 12.5 mL/hr over 240 Minutes Intravenous Every 8 hours 02/13/24 0812     02/07/24 0800  vancomycin (VANCOCIN) IVPB 1000 mg/200 mL premix  Status:  Discontinued        1,000 mg 200 mL/hr over 60 Minutes Intravenous Every 24 hours 02/07/24 0632 02/09/24 0905   02/06/24 2200  meropenem (MERREM) 1 g in sodium chloride  0.9 % 100 mL IVPB  Status:  Discontinued        1 g 200 mL/hr over 30 Minutes Intravenous Every 12 hours 02/06/24 1127 02/13/24 0812   02/06/24 1300  vancomycin (VANCOREADY) IVPB 1750 mg/350 mL  Status:  Discontinued        1,750 mg 175 mL/hr over 120 Minutes Intravenous Every 48 hours 02/04/24 1225 02/04/24 1226   02/06/24 0800  vancomycin (VANCOCIN) IVPB 1000 mg/200 mL premix        1,000 mg 200 mL/hr over 60 Minutes Intravenous  Once 02/06/24 0629 02/07/24 0829   02/04/24 1400  meropenem (MERREM) 1 g in sodium chloride  0.9 % 100 mL IVPB  Status:  Discontinued        1 g 200 mL/hr over 30 Minutes Intravenous Every 8 hours 02/04/24 1208 02/06/24 1127   02/04/24 1300  linezolid  (ZYVOX ) IVPB 600 mg  Status:  Discontinued        600 mg 300 mL/hr over 60 Minutes Intravenous Every 12 hours 02/04/24 1202 02/04/24 1208   02/04/24 1300  vancomycin (VANCOREADY) IVPB 2000 mg/400 mL        2,000 mg 200 mL/hr over 120 Minutes Intravenous  Once 02/04/24 1208 02/04/24 1623   02/04/24 1208  vancomycin variable dose per unstable renal function (pharmacist dosing)  Status:  Discontinued         Does  not apply See admin instructions 02/04/24 1208 02/07/24 0632   01/31/24 1030  fluconazole (DIFLUCAN) IVPB 200 mg  Status:  Discontinued        200 mg 100 mL/hr over 60 Minutes Intravenous Daily 01/31/24 0938 02/20/24 1218   01/30/24 1445  piperacillin -tazobactam (ZOSYN ) IVPB 3.375 g  Status:  Discontinued        3.375 g 12.5 mL/hr over 240 Minutes Intravenous Every 8 hours 01/30/24 1354  02/04/24 1203   01/26/24 1000  amoxicillin-clavulanate (AUGMENTIN) 400-57 MG/5ML suspension 875 mg  Status:  Discontinued        875 mg Oral Every 12 hours 01/26/24 0658 01/30/24 1354   01/26/24 1000  fluconazole (DIFLUCAN) 40 MG/ML suspension 200 mg  Status:  Discontinued        200 mg Oral Daily 01/26/24 0658 01/31/24 0938   01/22/24 1445  fluconazole (DIFLUCAN) IVPB 400 mg  Status:  Discontinued        400 mg 100 mL/hr over 120 Minutes Intravenous Every 24 hours 01/22/24 1436 01/22/24 1439   01/22/24 1445  fluconazole (DIFLUCAN) IVPB 200 mg  Status:  Discontinued        200 mg 100 mL/hr over 60 Minutes Intravenous Every 24 hours 01/22/24 1439 01/26/24 0658   01/21/24 2200  piperacillin -tazobactam (ZOSYN ) IVPB 3.375 g  Status:  Discontinued       Placed in Followed by Linked Group   3.375 g 12.5 mL/hr over 240 Minutes Intravenous Every 8 hours 01/21/24 1607 01/26/24 0658   01/21/24 1615  piperacillin -tazobactam (ZOSYN ) IVPB 3.375 g       Placed in Followed by Linked Group   3.375 g 100 mL/hr over 30 Minutes Intravenous  Once 01/21/24 1607 01/21/24 1651        CBC: Recent Labs  Lab 02/19/24 0500 02/20/24 0617 02/22/24 0441 02/23/24 0403 02/24/24 0405  WBC 8.9 10.7* 12.2* 11.1* 10.7*  HGB 11.5* 11.4* 12.7* 12.6* 12.0*  HCT 36.4* 35.8* 40.5 40.3 38.2*  MCV 87.5 87.7 88.6 88.4 88.6  PLT 242 220 259 235 250   BMP &GFR Recent Labs  Lab 02/19/24 0500 02/20/24 0617 02/21/24 0632 02/21/24 0633 02/22/24 0441 02/23/24 0403 02/24/24 0405  NA 141  --   --  143 142 144 143  K 4.2  --    --  4.2 4.3 4.0 4.3  CL 103  --   --  103 103 101 102  CO2 26  --   --  25 26 24 26   GLUCOSE 135*  --   --  145* 114* 141* 145*  BUN 80*  --   --  79* 93* 106* 115*  CREATININE 2.20*  --   --  2.01* 2.51* 2.71* 2.98*  CALCIUM  9.5  --   --  9.5 9.2 9.0 8.9  MG 2.5* 2.4 2.1  --  2.1 2.2 2.3  PHOS 4.0  --   --  3.1 3.5 3.8 4.4   Estimated Creatinine Clearance: 21.9 mL/min (A) (by C-G formula based on SCr of 2.98 mg/dL (H)).  Liver & Pancreas: Recent Labs  Lab 02/18/24 0320 02/18/24 0521 02/19/24 0500 02/21/24 0633 02/22/24 0441  AST  --   --  12*  --  12*  ALT  --   --  15  --  15  ALKPHOS  --   --  72  --  70  BILITOT  --   --  0.5  --  0.5  PROT  --   --  7.1  --  6.8  ALBUMIN  1.8* 2.1* 1.9* 2.0* 2.0*    Recent Labs  Lab 02/23/24 0732 02/23/24 1117 02/23/24 1646 02/24/24 0023 02/24/24 0347  GLUCAP 146* 152* 151* 146* 148*     Microbiology: No results found for this or any previous visit (from the past 240 hours).   Radiology Studies: No results found.  Anwar Sakata 02/24/2024  If 7PM-7AM, please contact night-coverage [email or web address] 02/24/2024, 10:31 AM

## 2024-02-24 NOTE — Progress Notes (Signed)
 5 Days Post-Op Procedure(s) (LRB): EGD (ESOPHAGOGASTRODUODENOSCOPY) (N/A) INSERTION, PEG TUBE (N/A) Subjective: Extremely unhappy Apparently he had soiled bed and call bell was not working Tolerating Tube feedings so far  Objective: Vital signs in last 24 hours: Temp:  [97.6 F (36.4 C)-98.2 F (36.8 C)] 97.9 F (36.6 C) (11/08 1116) Pulse Rate:  [66-78] 78 (11/08 1116) Cardiac Rhythm: Normal sinus rhythm (11/08 0722) Resp:  [12-17] 15 (11/08 0800) BP: (119-133)/(47-68) 133/47 (11/08 0751) SpO2:  [96 %-100 %] 100 % (11/08 1116) Weight:  [68.2 kg] 68.2 kg (11/08 0552)  Hemodynamic parameters for last 24 hours:    Intake/Output from previous day: 11/07 0701 - 11/08 0700 In: 1120.7 [I.V.:428.7; NG/GT:642.8; IV Piggyback:49.3] Out: 1500 [Urine:1500] Intake/Output this shift: Total I/O In: -  Out: 400 [Urine:400]  General appearance: alert, cooperative, and no distress Neurologic: intact Lungs: clear to auscultation bilaterally  Lab Results: Recent Labs    02/23/24 0403 02/24/24 0405  WBC 11.1* 10.7*  HGB 12.6* 12.0*  HCT 40.3 38.2*  PLT 235 250   BMET:  Recent Labs    02/23/24 0403 02/24/24 0405  NA 144 143  K 4.0 4.3  CL 101 102  CO2 24 26  GLUCOSE 141* 145*  BUN 106* 115*  CREATININE 2.71* 2.98*  CALCIUM  9.0 8.9    PT/INR: No results for input(s): LABPROT, INR in the last 72 hours. ABG    Component Value Date/Time   PHART 7.292 (L) 02/05/2024 0346   HCO3 25.7 02/05/2024 0346   TCO2 27 02/05/2024 0346   ACIDBASEDEF 1.0 02/05/2024 0346   O2SAT 64.1 02/10/2024 0748   CBG (last 3)  Recent Labs    02/24/24 0023 02/24/24 0347 02/24/24 1152  GLUCAP 146* 148* 110*    Assessment/Plan: S/P Procedure(s) (LRB): EGD (ESOPHAGOGASTRODUODENOSCOPY) (N/A) INSERTION, PEG TUBE (N/A) Talked with nursing and call bell has been fixed  Right pleural tube pulled out yesterday- no effusion on CXR Left pleural tube in place with minimal output   LOS: 34  days    Elspeth JAYSON Millers 02/24/2024

## 2024-02-25 DIAGNOSIS — E43 Unspecified severe protein-calorie malnutrition: Secondary | ICD-10-CM | POA: Diagnosis not present

## 2024-02-25 DIAGNOSIS — K223 Perforation of esophagus: Secondary | ICD-10-CM | POA: Diagnosis not present

## 2024-02-25 LAB — CBC
HCT: 38 % — ABNORMAL LOW (ref 39.0–52.0)
Hemoglobin: 11.9 g/dL — ABNORMAL LOW (ref 13.0–17.0)
MCH: 27.8 pg (ref 26.0–34.0)
MCHC: 31.3 g/dL (ref 30.0–36.0)
MCV: 88.8 fL (ref 80.0–100.0)
Platelets: 260 K/uL (ref 150–400)
RBC: 4.28 MIL/uL (ref 4.22–5.81)
RDW: 19 % — ABNORMAL HIGH (ref 11.5–15.5)
WBC: 11.1 K/uL — ABNORMAL HIGH (ref 4.0–10.5)
nRBC: 0 % (ref 0.0–0.2)

## 2024-02-25 LAB — BASIC METABOLIC PANEL WITH GFR
Anion gap: 13 (ref 5–15)
BUN: 105 mg/dL — ABNORMAL HIGH (ref 8–23)
CO2: 24 mmol/L (ref 22–32)
Calcium: 8.9 mg/dL (ref 8.9–10.3)
Chloride: 105 mmol/L (ref 98–111)
Creatinine, Ser: 2.65 mg/dL — ABNORMAL HIGH (ref 0.61–1.24)
GFR, Estimated: 25 mL/min — ABNORMAL LOW (ref >60)
Glucose, Bld: 164 mg/dL — ABNORMAL HIGH (ref 70–99)
Potassium: 4.2 mmol/L (ref 3.5–5.1)
Sodium: 142 mmol/L (ref 135–145)

## 2024-02-25 LAB — GLUCOSE, CAPILLARY
Glucose-Capillary: 159 mg/dL — ABNORMAL HIGH (ref 70–99)
Glucose-Capillary: 130 mg/dL — ABNORMAL HIGH (ref 70–99)
Glucose-Capillary: 133 mg/dL — ABNORMAL HIGH (ref 70–99)
Glucose-Capillary: 130 mg/dL — ABNORMAL HIGH (ref 70–99)
Glucose-Capillary: 120 mg/dL — ABNORMAL HIGH (ref 70–99)
Glucose-Capillary: 159 mg/dL — ABNORMAL HIGH (ref 70–99)

## 2024-02-25 LAB — MAGNESIUM: Magnesium: 2.7 mg/dL — ABNORMAL HIGH (ref 1.7–2.4)

## 2024-02-25 LAB — PHOSPHORUS: Phosphorus: 4.1 mg/dL (ref 2.5–4.6)

## 2024-02-25 MED ORDER — SODIUM CHLORIDE (PF) 0.9 % IJ SOLN
INTRAMUSCULAR | Status: AC
  Administered 2024-02-25: 10 mL
  Filled 2024-02-25: qty 10

## 2024-02-25 MED ORDER — TRAVASOL 10 % IV SOLN
INTRAVENOUS | Status: AC
  Filled 2024-02-25: qty 430

## 2024-02-25 NOTE — Plan of Care (Signed)

## 2024-02-25 NOTE — Progress Notes (Signed)
 Pt is placing wet washcloths on peg tube incision site , RN requested pt to remove wet wash cloth from site for risk of infection and skin maceration. Pt hesitant Pt states he understands why his dressing should not be wet.  RN replaced dressing with dry dressing  Pt states he will try not to place a wet wash cloth on his site again

## 2024-02-25 NOTE — Progress Notes (Signed)
 PROGRESS NOTE  Billy [name] FMW:[id number] DOB: May 12, 1950   PCP: Seabron Lenis, MD  Patient is from: Home.  DOA: 01/21/2024 LOS: 35   Brief Narrative / Interim history: 73 year old M with PMH of cardiac arrest earlier this year along with HFrEF, Afib, PE presented with several days of nausea and vomiting from a stomach bug followed by sudden onset severe tearing epigastric pain radiating to back and chest. Workup revealed esophageal tear/Boerhaave. Patient had esophageal stent placed on 10/7.  Subsequent swallow study revealed an additional leak, and he had stent repositioned on 01/31/2024.  He became hypoxic with gastric contents noted on intubation, and he was transferred to ICU for mechanical ventilation.  Bedside ultrasound with right pleural effusion for which he had chest tube placed with removal of 300 cc exudative fluid.  He was also successfully extubated on 10/16.  Patient has had increased oxygen  requirement requiring reintubation on 10/20.  Bedside ultrasound showed left-sided pleural effusion and he had left-sided chest tube with 120 cc exudative output. ICU stay complicated by high O2 requirement, left sided pleural effusion, chest tube placement 10/16, reintubated 10/20.  Eventually, patient was extubated and transferred out of ICU on 10/28. Hospital course complicated by acute on chronic HFpEF for which he is on IV Lasix  per advanced heart failure team. PEG tube placed on 11/3.  Dietitian consulted for initiation of tube feed.  Remains on TPN until tube feed at goal.  Subjective: Patient in better spirits this morning.  Diarrhea appears to have improved.  Denies any nausea this morning.  Pain is adequately controlled.      Assessment and plan:  Esophageal rupture/ Boerhaave syndrome- Likely from excessive vomiting -S/p stent placement on 10/7 -S/p stent repositioning on 10/16 due to leakage -Esophagogram 10/24 shows persistent leak -PEG tube placed on 11/3.  RD consulted  for initiation of TF -Continue TPN until TF at goal Patient had bilateral chest tubes.  He accidentally pulled out the right chest tube on 11/7.  Patient was seen by cardiothoracic surgery.  They are leaving the tube out.  He continues to have left-sided chest tube.  Cardiothoracic is managing.  Was found to have some erythema and purulent drainage from the right sided chest tube site.  He remains on Zosyn .  Duration of antibiotics per cardiothoracic. Still has left-sided chest tube.  Acute hypoxic/ hypercapnic respiratory failure- Multifactorial including aspiration pneumonia, pleural effusion and CHF.  Trach aspirate on 10/15 with Candida albicans. -IV meropenem>> IV Zosyn  for pneumonia per CTS. -Diflucan for Candida albicans from trach aspirate from 10/15-11/4. -Continue bronchodilators Respiratory status is stable.   Acute on chronic diastolic CHF Limited TTE on 10/23 with LVEF of 60 to 65%.  RHC in 3/25 with mild PAH with severe RV failure but preserved CO.  Appears euvolemic on exam. Heart failure team has been following.  Patient was started on diuretics and Entresto  however these have been held due to increase in creatinine.   Patient now on hydralazine  with plans to initiate nitrates when he is able to take orally.   AKI on CKD-3B:  Baseline Cr~2.0.  Renal function had improved but creatinine has been gradually rising for the past few days.  Patient's diuretics and Entresto  were held.  Creatinine noted to be better today compared to yesterday.  Continue to monitor daily for now.     Paroxysmal A-fib:  s/p TEE DCCV 3/25.  Seems to be in sinus rhythm. Transitioned to p.o. amiodarone  and Eliquis    Intractable pain Required ketamine  and started on IV Dilaudid  PCA while in ICU. Patient on as needed oxycodone  as well. Remains on Dilaudid  PCA.  Depending on how much he uses in the next 24 hours we will try to transition to as needed medications.  H/o PE/ DVT-  S/p infrarenal IVC  placement 3/25. V/Q 3/25 with multiple perfusion defects Now on Eliquis .  Essential hypertension:  Blood pressure is reasonably well-controlled.  Patient is currently on hydralazine .  Nausea and diarrhea Patient with persistent nausea and diarrhea.  Medication changes were made.  He is now on Phenergan and Lomotil as needed.  Diarrhea appears to have subsided.  Continue probiotics.    Unwitnessed fall in the hospital:  Found down on the floor the evening of 10/31.  No apparent injury or focal neurodeficit. - Fall precaution  Anxiety disorder: Stable. -Ativan  as needed   Acute on chronic anemia -Monitor H&H.   Leukocytosis: Resolved.  GERD - IV PPI.  Goal of care: Noted DNR paperwork in [facility] but patient confirms full CODE STATUS.SABRA   Severe protein calorie malnutrition/hypoalbuminemia Body mass index is 21.52 kg/m. Nutrition Problem: Severe Malnutrition Etiology: acute illness (may have chronic component due to time frame of reported wt loss) Signs/Symptoms: severe fat depletion, moderate muscle depletion Interventions: Refer to RD note for recommendations Patient was started on TPN.  G-tube was subsequently placed.  Now on tube feedings.  TPN being weaned down gradually.  Pressure skin injury Wound 02/07/24 0900 Pressure Injury Foot Anterior;Right Stage 1 -  Intact skin with non-blanchable redness of a localized area usually over a bony prominence. (Active)     Wound 02/07/24 0900 Pressure Injury Foot Anterior;Left Stage 1 -  Intact skin with non-blanchable redness of a localized area usually over a bony prominence. (Active)     Wound 02/07/24 0900 Pressure Injury Buttocks Right Stage 1 -  Intact skin with non-blanchable redness of a localized area usually over a bony prominence. (Active)   DVT prophylaxis: On Eliquis  now Code Status: Full code Family Communication: None at bedside today. Disposition:  SNF when medically stable  Consultants:  Gastroenterology Critical  care Cardiology Cardiothoracic surgery  Microbiology summarized: 10/5-MRSA PCR screen positive 10/15-trach aspirate culture with Candida albicans.  Objective: Vitals:   02/25/24 0440 02/25/24 0548 02/25/24 0741 02/25/24 0823  BP: (!) 123/57 (!) 135/59 (!) 146/60   Pulse: 70  70   Resp: 16  17 16   Temp: 97.8 F (36.6 C)  97.9 F (36.6 C)   TempSrc: Oral  Oral   SpO2: 99%  100% 99%  Weight: 74 kg     Height:        Examination:  General appearance: Awake alert.  In no distress Resp: Clear to auscultation bilaterally.  Normal effort Left-sided chest tube noted. Cardio: S1-S2 is normal regular.  No S3-S4.  No rubs murmurs or bruit GI: Abdomen is soft.  Nontender nondistended.  Bowel sounds are present normal.  No masses organomegaly.  PEG tube is noted. Extremities: No edema.  Full range of motion of lower extremities. Neurologic: Alert and oriented x3.  No focal neurological deficits.     Sch Meds:  Scheduled Meds:  amiodarone   200 mg Per Tube BID   apixaban   5 mg Per Tube BID   bisacodyl   10 mg Rectal Q0600   Chlorhexidine  Gluconate Cloth  6 each Topical Daily   fiber supplement (BANATROL TF)  60 mL Per Tube TID   fluconazole  200 mg Per Tube Daily   [name]  butt cream   Topical BID   hydrALAZINE   25 mg Per Tube Q8H   HYDROmorphone    Intravenous Q4H   insulin  aspart  0-9 Units Subcutaneous Q4H   lidocaine   1 patch Transdermal Q24H   pantoprazole  (PROTONIX ) IV  40 mg Intravenous Q12H   saccharomyces boulardii  250 mg Per Tube BID   sodium chloride  flush  10 mL Intrapleural Q8H   sodium chloride  flush  10-40 mL Intracatheter Q12H   Continuous Infusions:  feeding supplement (KATE FARMS STANDARD ENT 1.4) 45 mL/hr at 02/24/24 1842   piperacillin -tazobactam (ZOSYN )  IV 3.375 g (02/25/24 0548)   promethazine (PHENERGAN) injection (IM or IVPB) Stopped (02/23/24 1843)   TPN ADULT (ION) 35 mL/hr at 02/24/24 1842   TPN ADULT (ION)     PRN Meds:.acetaminophen   (TYLENOL ) oral liquid 160 mg/5 mL, diclofenac Sodium, diphenhydrAMINE **OR** diphenhydrAMINE, [COMPLETED] diphenoxylate-atropine **FOLLOWED BY** diphenoxylate-atropine, hydrALAZINE , levalbuterol, lip balm, metoCLOPramide (REGLAN) injection, naloxone  **AND** sodium chloride  flush, mouth rinse, oxyCODONE , promethazine (PHENERGAN) injection (IM or IVPB), sodium chloride  flush  Antimicrobials: Anti-infectives (From admission, onward)    Start     Dose/Rate Route Frequency Ordered Stop   02/22/24 1844  fluconazole (DIFLUCAN) tablet 200 mg        200 mg Per Tube Daily 02/22/24 1844 03/04/24 0959   02/22/24 1630  fluconazole (DIFLUCAN) tablet 200 mg  Status:  Discontinued        200 mg Oral Daily 02/22/24 1537 02/22/24 1844   02/13/24 0900  piperacillin -tazobactam (ZOSYN ) IVPB 3.375 g        3.375 g 12.5 mL/hr over 240 Minutes Intravenous Every 8 hours 02/13/24 0812     02/07/24 0800  vancomycin (VANCOCIN) IVPB 1000 mg/200 mL premix  Status:  Discontinued        1,000 mg 200 mL/hr over 60 Minutes Intravenous Every 24 hours 02/07/24 0632 02/09/24 0905   02/06/24 2200  meropenem (MERREM) 1 g in sodium chloride  0.9 % 100 mL IVPB  Status:  Discontinued        1 g 200 mL/hr over 30 Minutes Intravenous Every 12 hours 02/06/24 1127 02/13/24 0812   02/06/24 1300  vancomycin (VANCOREADY) IVPB 1750 mg/350 mL  Status:  Discontinued        1,750 mg 175 mL/hr over 120 Minutes Intravenous Every 48 hours 02/04/24 1225 02/04/24 1226   02/06/24 0800  vancomycin (VANCOCIN) IVPB 1000 mg/200 mL premix        1,000 mg 200 mL/hr over 60 Minutes Intravenous  Once 02/06/24 0629 02/07/24 0829   02/04/24 1400  meropenem (MERREM) 1 g in sodium chloride  0.9 % 100 mL IVPB  Status:  Discontinued        1 g 200 mL/hr over 30 Minutes Intravenous Every 8 hours 02/04/24 1208 02/06/24 1127   02/04/24 1300  linezolid  (ZYVOX ) IVPB 600 mg  Status:  Discontinued        600 mg 300 mL/hr over 60 Minutes Intravenous Every 12 hours  02/04/24 1202 02/04/24 1208   02/04/24 1300  vancomycin (VANCOREADY) IVPB 2000 mg/400 mL        2,000 mg 200 mL/hr over 120 Minutes Intravenous  Once 02/04/24 1208 02/04/24 1623   02/04/24 1208  vancomycin variable dose per unstable renal function (pharmacist dosing)  Status:  Discontinued         Does not apply See admin instructions 02/04/24 1208 02/07/24 0632   01/31/24 1030  fluconazole (DIFLUCAN) IVPB 200 mg  Status:  Discontinued  200 mg 100 mL/hr over 60 Minutes Intravenous Daily 01/31/24 0938 02/20/24 1218   01/30/24 1445  piperacillin -tazobactam (ZOSYN ) IVPB 3.375 g  Status:  Discontinued        3.375 g 12.5 mL/hr over 240 Minutes Intravenous Every 8 hours 01/30/24 1354 02/04/24 1203   01/26/24 1000  amoxicillin-clavulanate (AUGMENTIN) 400-57 MG/5ML suspension 875 mg  Status:  Discontinued        875 mg Oral Every 12 hours 01/26/24 0658 01/30/24 1354   01/26/24 1000  fluconazole (DIFLUCAN) 40 MG/ML suspension 200 mg  Status:  Discontinued        200 mg Oral Daily 01/26/24 0658 01/31/24 0938   01/22/24 1445  fluconazole (DIFLUCAN) IVPB 400 mg  Status:  Discontinued        400 mg 100 mL/hr over 120 Minutes Intravenous Every 24 hours 01/22/24 1436 01/22/24 1439   01/22/24 1445  fluconazole (DIFLUCAN) IVPB 200 mg  Status:  Discontinued        200 mg 100 mL/hr over 60 Minutes Intravenous Every 24 hours 01/22/24 1439 01/26/24 0658   01/21/24 2200  piperacillin -tazobactam (ZOSYN ) IVPB 3.375 g  Status:  Discontinued       Placed in Followed by Linked Group   3.375 g 12.5 mL/hr over 240 Minutes Intravenous Every 8 hours 01/21/24 1607 01/26/24 0658   01/21/24 1615  piperacillin -tazobactam (ZOSYN ) IVPB 3.375 g       Placed in Followed by Linked Group   3.375 g 100 mL/hr over 30 Minutes Intravenous  Once 01/21/24 1607 01/21/24 1651        CBC: Recent Labs  Lab 02/20/24 0617 02/22/24 0441 02/23/24 0403 02/24/24 0405 02/25/24 0500  WBC 10.7* 12.2* 11.1* 10.7* 11.1*   HGB 11.4* 12.7* 12.6* 12.0* 11.9*  HCT 35.8* 40.5 40.3 38.2* 38.0*  MCV 87.7 88.6 88.4 88.6 88.8  PLT 220 259 235 250 260   BMP &GFR Recent Labs  Lab 02/21/24 0632 02/21/24 0633 02/22/24 0441 02/23/24 0403 02/24/24 0405 02/25/24 0500  NA  --  143 142 144 143 142  K  --  4.2 4.3 4.0 4.3 4.2  CL  --  103 103 101 102 105  CO2  --  25 26 24 26 24   GLUCOSE  --  145* 114* 141* 145* 164*  BUN  --  79* 93* 106* 115* 105*  CREATININE  --  2.01* 2.51* 2.71* 2.98* 2.65*  CALCIUM   --  9.5 9.2 9.0 8.9 8.9  MG 2.1  --  2.1 2.2 2.3 2.7*  PHOS  --  3.1 3.5 3.8 4.4 4.1   Estimated Creatinine Clearance: 26.8 mL/min (A) (by C-G formula based on SCr of 2.65 mg/dL (H)).  Liver & Pancreas: Recent Labs  Lab 02/19/24 0500 02/21/24 0633 02/22/24 0441  AST 12*  --  12*  ALT 15  --  15  ALKPHOS 72  --  70  BILITOT 0.5  --  0.5  PROT 7.1  --  6.8  ALBUMIN  1.9* 2.0* 2.0*    Recent Labs  Lab 02/24/24 1559 02/24/24 2010 02/24/24 2355 02/25/24 0442 02/25/24 0748  GLUCAP 118* 114* 125* 159* 130*     Microbiology: No results found for this or any previous visit (from the past 240 hours).   Radiology Studies: No results found.  Ellina Sivertsen 02/25/2024  If 7PM-7AM, please contact night-coverage [email or web address] 02/25/2024, 10:31 AM

## 2024-02-25 NOTE — Progress Notes (Signed)
 Physical Therapy Treatment Patient Details Name: Billy [name] MRN: [id number] DOB: March 31, 1951 Today's Date: 02/25/2024   History of Present Illness Billy [name] is a 73 y.o. male who presented to [facility] ED 01/21/24 with several days of vomiting followed by an episode of severe tearing epigastric pain. Work-up revealed esophageal tear. Pt s/p esophageal stent with EGD 10/7. Return to OR for esophageal stent adjustment on 10/15, remained intubated after procedure due to suspected aspiration. Extubated 10/16.  Pt had CT placed 10/16. Reintubated 10/20-10/24. G tube placement planned for 11/3. PMHx: HFrEF, AFib, CAD, PE complicated by cardiogenic shock, CKD 4, MI, peripheral neuropathy, and TIA.    PT Comments  Pt tolerates treatment well, ambulating for multiple short bouts. Pt continues to require assistance for management of a plethora of lines/leads as well as for instability with dynamic gait tasks. PT will continue to follow in an effort to improve endurance and to reduce risk for falls. Patient will benefit from continued inpatient follow up therapy, <3 hours/day.    If plan is discharge home, recommend the following: Assistance with cooking/housework;Assist for transportation;Help with stairs or ramp for entrance;A lot of help with bathing/dressing/bathroom;Direct supervision/assist for medications management;Direct supervision/assist for financial management;Supervision due to cognitive status;Two people to help with walking and/or transfers   Can travel by private Theme Park Manager (measurements PT);Wheelchair cushion (measurements PT);Rolling walker (2 wheels)    Recommendations for Other Services       Precautions / Restrictions Precautions Precautions: Fall Recall of Precautions/Restrictions: Impaired Precaution/Restrictions Comments: L chest tube, G-tube, PCA pump, check BP with postural changes if symptomatic. Restrictions Weight Bearing Restrictions  Per Provider Order: No     Mobility  Bed Mobility Overal bed mobility: Needs Assistance Bed Mobility: Supine to Sit, Sit to Supine     Supine to sit: Contact guard Sit to supine: Contact guard assist        Transfers Overall transfer level: Needs assistance Equipment used: Rolling walker (2 wheels) Transfers: Sit to/from Stand Sit to Stand: Contact guard assist                Ambulation/Gait Ambulation/Gait assistance: Min assist Gait Distance (Feet): 40 Feet (additional trials of 24' and 16', pt walks forward and backward for 8' at a time) Assistive device: Rolling walker (2 wheels) Gait Pattern/deviations: Step-to pattern Gait velocity: reduced Gait velocity interpretation: <1.31 ft/sec, indicative of household ambulator   General Gait Details: slowed step-to gait, posterior lean with backward stepping   Stairs             Wheelchair Mobility     Tilt Bed    Modified Rankin (Stroke Patients Only)       Balance Overall balance assessment: Needs assistance Sitting-balance support: No upper extremity supported, Feet supported Sitting balance-Leahy Scale: Good     Standing balance support: Bilateral upper extremity supported, Reliant on assistive device for balance Standing balance-Leahy Scale: Poor                              Communication Communication Communication: No apparent difficulties  Cognition Arousal: Alert Behavior During Therapy: WFL for tasks assessed/performed   PT - Cognitive impairments: Attention, Problem solving                         Following commands: Intact      Cueing Cueing Techniques: Verbal cues  Exercises      General Comments General comments (skin integrity, edema, etc.): VSS on 2L [place]      Pertinent Vitals/Pain Pain Assessment Pain Assessment: Faces Faces Pain Scale: Hurts little more Pain Location: chest tube site Pain Descriptors / Indicators: Sore Pain Intervention(s):  Monitored during session    Home Living                          Prior Function            PT Goals (current goals can now be found in the care plan section) Acute Rehab PT Goals Patient Stated Goal: To get stronger before I go home Progress towards PT goals: Progressing toward goals    Frequency    Min 2X/week      PT Plan      Co-evaluation              AM-PAC PT 6 Clicks Mobility   Outcome Measure  Help needed turning from your back to your side while in a flat bed without using bedrails?: A Little Help needed moving from lying on your back to sitting on the side of a flat bed without using bedrails?: A Little Help needed moving to and from a bed to a chair (including a wheelchair)?: A Little Help needed standing up from a chair using your arms (e.g., wheelchair or bedside chair)?: A Little Help needed to walk in hospital room?: A Lot Help needed climbing 3-5 steps with a railing? : Total 6 Click Score: 15    End of Session Equipment Utilized During Treatment: Oxygen  Activity Tolerance: Patient tolerated treatment well Patient left: in bed;with call bell/phone within reach;with bed alarm set Nurse Communication: Mobility status PT Visit Diagnosis: Difficulty in walking, not elsewhere classified (R26.2);Unsteadiness on feet (R26.81);Other abnormalities of gait and mobility (R26.89);Muscle weakness (generalized) (M62.81);Other symptoms and signs involving the nervous system (R29.898);Pain;History of falling (Z91.81)     Time: 8653-8580 PT Time Calculation (min) (ACUTE ONLY): 33 min  Charges:    $Gait Training: 23-37 mins PT General Charges $$ ACUTE PT VISIT: 1 Visit                     Bernardino JINNY Ruth, PT, DPT Acute Rehabilitation Office [phone]    Bernardino JINNY Ruth 02/25/2024, 4:11 PM

## 2024-02-25 NOTE — Progress Notes (Signed)
 PHARMACY - TOTAL PARENTERAL NUTRITION CONSULT NOTE   Indication: intolerance to enteral feedings, esophageal perforation  Patient Measurements: Height: 6' 1 (185.4 cm) Weight: 74 kg (163 lb 2.3 oz) IBW/kg (Calculated) : 79.9 TPN AdjBW (KG): 80.6 Body mass index is 21.52 kg/m.  Assessment:  73 year old man with medical history significant for cardiac arrest earlier this year who was admitted after several days of nausea and vomiting followed by sudden onset severe tearing epigastric pain radiating to back and chest. 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 Pharmacy consulted to initiate TPN due to inability to tolerate enteral nutrition at this time.  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 PTA takes torsemide  20 mg PO daily, now held. Aggressive fluid resuscitation recommended per GI. Per HF, patient currently euvolemic but will hold diuretic for now. Due to CHF will target lower end of fluid range for TPN and supplement additional fluids per MD outside of TPN to prevent fluid overload.   Stent placed on 10/6, esophogram not showing any leak. Diet advanced to CLD. 10/15 Leak present and made NPO. 10/24 esophagram with persistent leak; will need stent replacement in 3-4 weeks.   Glucose / Insulin : A1C 6.0% (02/2023);  CBG 110-164, 4 units SSI used in 24h No insulin  in TPN, removed on 11/6  Electrolytes: Na 142, K 4.2, Cl 105, CO2 24, Ca 8.9 [CoCa 10.5], 11/7: phos 4.1, mg: 2.7 Renal: SCr improving 2.98 >> 2.65 (BL ~2), BUN down 105  Hepatic: 11/6- AST 12 /ALT 15 /tbili 0.5 /alk phos 72, alb 2, TG 56 (11/3)  Intake / Output; MIVF: furosemide  > torsemide  stopped 11/6; UOP 0.6 mL/kg/hr, chest tube 2 ml, stool occurrence x2 on 11/8, bisacodyl  daily (held), fiber, florastor  Net IO Since Admission: 3,735.95 mL [02/25/24 0908]  GI Imaging: 10/05 CT: concerning for esophageal perforation, masslike area of soft tissue thickening adjacent to and  surrounding the esophagus  10/06 Esoph XR: distal esophageal perforation 10/7 Esoph XR: No leak associated with stent coverage of esophageal perforation 10/14 KUB: Gas-filled loops of small bowel and colon suggestive of ileus 10/15 Esoph XR: esophageal perforation/leak at the distal esophagus, proximal to the esophageal stent 10/24 esophagram: persistent leak despite being covered by the esophageal stent.  GI Surgeries / Procedures:  10/6: esophageal stent placement  10/15 EGD and stent repositioning   Central access: 01/22/24 TPN start date: 01/22/24  RD Assessment:  Estimated Needs Total Energy Estimated Needs: 2300-2500 kcals Total Protein Estimated Needs: 110-130 g Total Fluid Estimated Needs: 2L  Current Nutrition:  TPN/NPO  10/13- 10/14 DYS1: not taking any po  10/15: NPO d/t increased abd distension  10/22 Propofol  at 7.6- 20.2 ml/hr, providing ~370 kcals/24hr- OFF 10/23: switch propofol  back to dexmedetomidine - OFF 11/4: Mallie Pinion started 20 ml/hr, goal 60 ml/hr 11/5 Mallie Pinion goal 65 ml/hr  11/6 Mallie Pinion at 40 ml/hr (1344 kcal) - tolerated per RN 11/7 Mallie Pinion at 60 ml/hr but decreasing to 45 ml/hr d/t diarrhea 11/8 Mallie Pinion 48mL/hr (1512 kcal and 67g protein)   Plan:  Continue TPN at 39mL/hr, providing 43g protein and 847kcal. When combined with Mallie Pinion @ 6mL/hr (1512 kcal and 67g protein), patient will be getting 2359 kcal and 110g protein meeting 100% of nutritional needs.  Electrolytes in TPN: Na 14 mEq/L, K 30 mEq/L (empiric reduction on 11/8 due to rise in Scr), Ca 0 mEq/L, Mg 0 mEq/L, Phos 5 mmol/L (empiric reduction on 11/8 due to rise in  Scr). Cl:Ac max acetate. May need to increase electrolytes on 11/10 if AKI continues to improve.  Continue standard MVI and trace elements to TPN Monitor TPN labs on Mon/Thurs.  F/u TF tolerability and ability to wean TPN once tube feeds at goal Closely monitor fluid status for need to re-concentrate TPN. Likely  would only be able to concentrate another additional 120cc/day.   Thank you for involving pharmacy in this patient's care.  Powell Blush, PharmD, BCCCP  Clinical Pharmacist Clinical phone for 02/25/2024 is [phone] 02/25/2024 9:08 AM

## 2024-02-26 DIAGNOSIS — K223 Perforation of esophagus: Secondary | ICD-10-CM | POA: Diagnosis not present

## 2024-02-26 DIAGNOSIS — I5033 Acute on chronic diastolic (congestive) heart failure: Secondary | ICD-10-CM | POA: Diagnosis not present

## 2024-02-26 DIAGNOSIS — E43 Unspecified severe protein-calorie malnutrition: Secondary | ICD-10-CM | POA: Diagnosis not present

## 2024-02-26 LAB — COMPREHENSIVE METABOLIC PANEL WITH GFR
ALT: 18 U/L (ref 0–44)
AST: 15 U/L (ref 15–41)
Albumin: 1.9 g/dL — ABNORMAL LOW (ref 3.5–5.0)
Alkaline Phosphatase: 71 U/L (ref 38–126)
Anion gap: 14 (ref 5–15)
BUN: 83 mg/dL — ABNORMAL HIGH (ref 8–23)
CO2: 22 mmol/L (ref 22–32)
Calcium: 8.9 mg/dL (ref 8.9–10.3)
Chloride: 109 mmol/L (ref 98–111)
Creatinine, Ser: 2.26 mg/dL — ABNORMAL HIGH (ref 0.61–1.24)
GFR, Estimated: 30 mL/min — ABNORMAL LOW (ref >60)
Glucose, Bld: 153 mg/dL — ABNORMAL HIGH (ref 70–99)
Potassium: 4.5 mmol/L (ref 3.5–5.1)
Sodium: 145 mmol/L (ref 135–145)
Total Bilirubin: 0.5 mg/dL (ref 0.0–1.2)
Total Protein: 6.3 g/dL — ABNORMAL LOW (ref 6.5–8.1)

## 2024-02-26 LAB — CBC
HCT: 39.6 % (ref 39.0–52.0)
Hemoglobin: 12.3 g/dL — ABNORMAL LOW (ref 13.0–17.0)
MCH: 27.9 pg (ref 26.0–34.0)
MCHC: 31.1 g/dL (ref 30.0–36.0)
MCV: 89.8 fL (ref 80.0–100.0)
Platelets: 261 K/uL (ref 150–400)
RBC: 4.41 MIL/uL (ref 4.22–5.81)
RDW: 18.8 % — ABNORMAL HIGH (ref 11.5–15.5)
WBC: 11.7 K/uL — ABNORMAL HIGH (ref 4.0–10.5)
nRBC: 0 % (ref 0.0–0.2)

## 2024-02-26 LAB — TRIGLYCERIDES: Triglycerides: 83 mg/dL (ref <150)

## 2024-02-26 LAB — PHOSPHORUS: Phosphorus: 3.4 mg/dL (ref 2.5–4.6)

## 2024-02-26 LAB — GLUCOSE, CAPILLARY
Glucose-Capillary: 107 mg/dL — ABNORMAL HIGH (ref 70–99)
Glucose-Capillary: 122 mg/dL — ABNORMAL HIGH (ref 70–99)
Glucose-Capillary: 124 mg/dL — ABNORMAL HIGH (ref 70–99)
Glucose-Capillary: 127 mg/dL — ABNORMAL HIGH (ref 70–99)
Glucose-Capillary: 129 mg/dL — ABNORMAL HIGH (ref 70–99)
Glucose-Capillary: 130 mg/dL — ABNORMAL HIGH (ref 70–99)
Glucose-Capillary: 134 mg/dL — ABNORMAL HIGH (ref 70–99)
Glucose-Capillary: 142 mg/dL — ABNORMAL HIGH (ref 70–99)

## 2024-02-26 LAB — MAGNESIUM: Magnesium: 2.8 mg/dL — ABNORMAL HIGH (ref 1.7–2.4)

## 2024-02-26 MED ORDER — BANATROL TF EN LIQD
60.0000 mL | Freq: Four times a day (QID) | ENTERAL | Status: DC
  Administered 2024-02-26 – 2024-03-18 (×73): 60 mL
  Filled 2024-02-26 (×85): qty 60

## 2024-02-26 MED ORDER — HYDRALAZINE HCL 50 MG PO TABS: 50.0000 mg | ORAL_TABLET | Freq: Three times a day (TID) | ORAL | Status: DC

## 2024-02-26 MED ORDER — TRAVASOL 10 % IV SOLN
INTRAVENOUS | Status: AC
  Filled 2024-02-26: qty 430

## 2024-02-26 MED ORDER — KATE FARMS STANDARD 1.4 EN LIQD
1000.0000 mL | ENTERAL | Status: DC
  Administered 2024-02-26: 1000 mL
  Filled 2024-02-26 (×4): qty 1000

## 2024-02-26 MED ORDER — ISOSORBIDE MONONITRATE ER 30 MG PO TB24: 30.0000 mg | ORAL_TABLET | Freq: Every day | ORAL | Status: DC

## 2024-02-26 MED ORDER — SIMETHICONE 40 MG/0.6ML PO SUSP
80.0000 mg | Freq: Four times a day (QID) | ORAL | Status: DC | PRN
  Administered 2024-02-26 – 2024-03-30 (×14): 80 mg
  Filled 2024-02-26 (×19): qty 1.2

## 2024-02-26 MED ORDER — HYDRALAZINE HCL 50 MG PO TABS
100.0000 mg | ORAL_TABLET | Freq: Three times a day (TID) | ORAL | Status: DC
  Administered 2024-02-26 – 2024-03-27 (×81): 100 mg
  Filled 2024-02-26 (×83): qty 2

## 2024-02-26 MED ORDER — FENTANYL 50 MCG/HR TD PT72
1.0000 | MEDICATED_PATCH | TRANSDERMAL | Status: DC
Start: 2024-02-26 — End: 2024-02-29
  Administered 2024-02-26 – 2024-02-29 (×2): 1 via TRANSDERMAL
  Filled 2024-02-26 (×3): qty 1

## 2024-02-26 MED ORDER — ALUM & MAG HYDROXIDE-SIMETH 200-200-20 MG/5ML PO SUSP
30.0000 mL | ORAL | Status: DC | PRN
  Administered 2024-02-27 – 2024-04-07 (×21): 30 mL
  Filled 2024-02-26 (×22): qty 30

## 2024-02-26 NOTE — Progress Notes (Addendum)
 Nutrition Brief Note  Checked in with patient today. Noted with three documented type 6/7 BMs thus far today. He does not endorse significant nausea. States he has not gotten much sleep in the hospital and is depressed at times r/t his deconditioned state. Given stabilizing bowels, will increase TF to 41ml/hr and monitor tolerance. Note no free water  flushes currently ordered.   Estimated Nutritional Needs:  Kcal:  2300-2500 kcals Protein:  110-130 g Fluid:  2L  Banatrol being administered TID and Florastor started by pharmacy on Friday (11/07). Zofran  was changed to Phenergan and Imodium to Lomotil at the same time. Per MD notes, could use Reglan, but may worsen diarrhea. Of note, dehydration and disruption to the gut flora may also be a contributor. Continuing to monitor tolerance. Will increase Banatrol to QID.  Crt improving with discontinuation of diuretics and Entresto . Continues on PCA pump. No abdominal distention noted.   INTERVENTION:  Continue TPN to meet 50% estimated nutritional needs as TF meeting 80% estimated calorie and 75% protein needs -Continue MVI w/ trace elements added to TPN -Titrate TPN back up if patient begins to show s/sx of intolerance    Continue TF via PEG: -Increase [employer] 1.4 to 55 ml/h (1,320 ml per day)             - Increase Banatrol to QID d/t diarrhea-provides 5g soluble fiber per serving -Provides 1848 kcal (80% estimated needs), 82 gm protein (75% estimated needs), 937 ml free water  daily    When pt establishes tolerance, titrate up to goal rate via PEG: -[employer] 1.4 to 65 ml/h (1560 ml per day) -Prosource TF20 60ml daily -Recommend low volume FWF when able - Add MVI w/ minerals when TPN stopped             - Banatrol TID -provides 45kcal, 5g soluble fiber per serving -Provides 2264 kcal, 117 gm protein, 1108 ml free water  daily   NUTRITION DIAGNOSIS:  Severe Malnutrition related to acute illness (may have chronic component due to time  frame of reported wt loss) as evidenced by severe fat depletion, moderate muscle depletion. - Ongoing   GOAL:  Patient will meet greater than or equal to 90% of their needs - Meeting via TPN and TF  Blair Deaner MS, RD, LDN Registered Dietitian Clinical Nutrition RD Inpatient Contact Info in Amion

## 2024-02-26 NOTE — Progress Notes (Signed)
 Pt thought his PCA pump was not working and got very upset. RN attempted to reassure pt that the pump is indeed working. He expressed frustration on his pain being only momentarily controlled and then immediately returning. On call MD notified, new orders placed.

## 2024-02-26 NOTE — Progress Notes (Addendum)
 PCA pump lining changed and Dilaudid  syringe replaced. Dilaudid  4 mL documented waste in pyxis machine with Kristen, RN. Dilaudid  4 mL wasted in Steri Cycle bin. Old tubing properly disposed of in black bin in the med room. New Dilaudid  syringe now in place.

## 2024-02-26 NOTE — Progress Notes (Signed)
 PHARMACY - TOTAL PARENTERAL NUTRITION CONSULT NOTE   Indication: intolerance to enteral feedings, esophageal perforation  Patient Measurements: Height: 6' 1 (185.4 cm) Weight: 69.7 kg (153 lb 10.6 oz) IBW/kg (Calculated) : 79.9 TPN AdjBW (KG): 80.6 Body mass index is 20.27 kg/m.  Assessment:  73 year old man with medical history significant for cardiac arrest earlier this year who was admitted after several days of nausea and vomiting followed by sudden onset severe tearing epigastric pain radiating to back and chest. 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 Pharmacy consulted to initiate TPN due to inability to tolerate enteral nutrition at this time.  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 PTA takes torsemide  20 mg PO daily, now held. Aggressive fluid resuscitation recommended per GI. Per HF, patient currently euvolemic but will hold diuretic for now. Due to CHF will target lower end of fluid range for TPN and supplement additional fluids per MD outside of TPN to prevent fluid overload.   Stent placed on 10/6, esophogram not showing any leak. Diet advanced to CLD. 10/15 Leak present and made NPO. 10/24 esophagram with persistent leak; will need stent replacement in 3-4 weeks.   Glucose / Insulin : A1C 6.0% (02/2023);  CBG 120-153, 4 units SSI used in 24h No insulin  in TPN, removed on 11/6  Electrolytes: Na 145, K 4.5, Cl 109, CO2 22, Ca 8.9 [CoCa 10.5], phos 3.4, mg: 2.8 Renal: SCr improving 2.98 >> 2.65 (BL ~2), BUN down 105  Hepatic:  AST/ALT WNL /tbili 0.5 /alk phos 71, alb 1.9, TG 56 (11/3)  Intake / Output; MIVF: furosemide  > torsemide  stopped 11/6; UOP 1 mL/kg/hr, chest tube 0 ml, stool occurrence x2 on 11/9, bisacodyl  daily (held), fiber, florastor  Net IO Since Admission: 2,085.95 mL [02/26/24 0832]  GI Imaging: 10/05 CT: concerning for esophageal perforation, masslike area of soft tissue thickening adjacent to and surrounding  the esophagus  10/06 Esoph XR: distal esophageal perforation 10/7 Esoph XR: No leak associated with stent coverage of esophageal perforation 10/14 KUB: Gas-filled loops of small bowel and colon suggestive of ileus 10/15 Esoph XR: esophageal perforation/leak at the distal esophagus, proximal to the esophageal stent 10/24 esophagram: persistent leak despite being covered by the esophageal stent.  GI Surgeries / Procedures:  10/6: esophageal stent placement  10/15 EGD and stent repositioning   Central access: 01/22/24 TPN start date: 01/22/24  RD Assessment:  Estimated Needs Total Energy Estimated Needs: 2300-2500 kcals Total Protein Estimated Needs: 110-130 g Total Fluid Estimated Needs: 2L  Current Nutrition:  TPN/NPO  10/13- 10/14 DYS1: not taking any po  10/15: NPO d/t increased abd distension  10/22 Propofol  at 7.6- 20.2 ml/hr, providing ~370 kcals/24hr- OFF 10/23: switch propofol  back to dexmedetomidine - OFF 11/4: Mallie Pinion started 20 ml/hr, goal 60 ml/hr 11/5 Mallie Pinion goal 65 ml/hr  11/6 Mallie Pinion at 40 ml/hr (1344 kcal) - tolerated per RN 11/7 Mallie Pinion at 60 ml/hr but decreasing to 45 ml/hr d/t diarrhea 11/8 Mallie Pinion 74mL/hr (1512 kcal and 67g protein)   Plan:  Continue TPN at 72mL/hr, providing 43g protein and 847kcal. When combined with Mallie Pinion @ 32mL/hr (1512 kcal and 67g protein), patient will be getting 2359 kcal and 110g protein meeting 100% of nutritional needs.  Electrolytes in TPN: decr Na 10 mEq/L, K 30 mEq/L , Ca 0 mEq/L, Mg 0 mEq/L, incr Phos 6 mmol/L. Cl:Ac max acetate.  Continue standard MVI and trace elements to TPN Monitor TPN labs on Mon/Thurs.  F/u TF tolerability and ability to wean TPN once tube feeds at goal Closely monitor fluid status for need to re-concentrate TPN. Likely would only be able to concentrate another additional 120cc/day.   Thank you for involving pharmacy in this patient's care.  Donny Alert, PharmD, [facility] Clinical  Pharmacist Please see AMION for all Pharmacists' Contact Phone Numbers 02/26/2024, 8:40 AM

## 2024-02-26 NOTE — Progress Notes (Signed)
 PROGRESS NOTE  Billy [name] FMW:[id number] DOB: [birth year]   PCP: Seabron Lenis, MD  Patient is from: Home.  DOA: 01/21/2024 LOS: 36   Brief Narrative / Interim history: 73 year old M with PMH of cardiac arrest earlier this year along with HFrEF, Afib, PE presented with several days of nausea and vomiting from a stomach bug followed by sudden onset severe tearing epigastric pain radiating to back and chest. Workup revealed esophageal tear/Boerhaave. Patient had esophageal stent placed on 10/7.  Subsequent swallow study revealed an additional leak, and he had stent repositioned on 01/31/2024.  He became hypoxic with gastric contents noted on intubation, and he was transferred to ICU for mechanical ventilation.  Bedside ultrasound with right pleural effusion for which he had chest tube placed with removal of 300 cc exudative fluid.  He was also successfully extubated on 10/16.  Patient has had increased oxygen  requirement requiring reintubation on 10/20.  Bedside ultrasound showed left-sided pleural effusion and he had left-sided chest tube with 120 cc exudative output. ICU stay complicated by high O2 requirement, left sided pleural effusion, chest tube placement 10/16, reintubated 10/20.  Eventually, patient was extubated and transferred out of ICU on 10/28. Hospital course complicated by acute on chronic HFpEF for which he is on IV Lasix  per advanced heart failure team. PEG tube placed on 11/3.  Dietitian consulted for initiation of tube feed.  Remains on TPN until tube feed at goal.  Subjective: Patient denies any new complaints.  Continues to require PCA pump.  Complains of some indigestion.  Pain is adequately controlled currently.  Assessment and plan:  Esophageal rupture/ Boerhaave syndrome Likely from excessive vomiting -S/p stent placement on 10/7 -S/p stent repositioning on 10/16 due to leakage -Esophagogram 10/24 shows persistent leak -PEG tube placed on 11/3.  RD consulted for  initiation of TF -Continue TPN until TF at goal Patient had bilateral chest tubes.  He accidentally pulled out the right chest tube on 11/7.  Patient was seen by cardiothoracic surgery.  They are leaving the tube out.   He continues to have left-sided chest tube.  Cardiothoracic is managing.   Was found to have some erythema and purulent drainage from the right sided chest tube site.  He remains on Zosyn .  Duration of antibiotics per cardiothoracic.  Acute hypoxic/ hypercapnic respiratory failure- Multifactorial including aspiration pneumonia, pleural effusion and CHF.  Trach aspirate on 10/15 with Candida albicans. -IV meropenem>> IV Zosyn  for pneumonia per CTS. -Diflucan for Candida albicans from trach aspirate from 10/15-11/4. -Continue bronchodilators Respiratory status is stable   Acute on chronic diastolic CHF Limited TTE on 10/23 with LVEF of 60 to 65%.  RHC in 3/25 with mild PAH with severe RV failure but preserved CO.  Appears euvolemic on exam. Heart failure team has been following.  Patient was started on diuretics and Entresto  however these have been held due to increase in creatinine.   Patient now on hydralazine  with plans to initiate nitrates when he is able to take orally.   AKI on CKD-3B:  Baseline Cr~2.0.   Renal function worsened with diuretics and Entresto  both of which were held.  Now improving. Monitor urine output.  Avoid nephrotoxic agents.     Paroxysmal A-fib:  s/p TEE DCCV 3/25.  Seems to be in sinus rhythm. Transitioned to p.o. amiodarone  and Eliquis    Intractable pain Required ketamine and started on IV Dilaudid  PCA while in ICU. Patient on as needed oxycodone  as well. Remains on Dilaudid  PCA.  Continues to require  PCA pump.    H/o PE/ DVT-  S/p infrarenal IVC placement 3/25. V/Q 3/25 with multiple perfusion defects Now on Eliquis .  Essential hypertension:  Blood pressure is reasonably well-controlled.  Patient is currently on hydralazine .  Nausea  and diarrhea Patient with persistent nausea and diarrhea.  Medication changes were made.  He is now on Phenergan and Lomotil as needed.  Diarrhea appears to have subsided.  Continue probiotics.    Unwitnessed fall in the hospital:  Found down on the floor the evening of 10/31.  No apparent injury or focal neurodeficit. Fall precaution  Anxiety disorder:  Stable. Ativan  as needed   Acute on chronic anemia Hemoglobin has been stable.  No evidence of overt blood loss   GERD - IV PPI.  Severe protein calorie malnutrition/hypoalbuminemia Body mass index is 20.27 kg/m. Nutrition Problem: Severe Malnutrition Etiology: acute illness (may have chronic component due to time frame of reported wt loss) Signs/Symptoms: severe fat depletion, moderate muscle depletion Interventions: Refer to RD note for recommendations Patient was started on TPN.  G-tube was subsequently placed.  Now on tube feedings.  TPN being weaned down gradually.  Pressure skin injury Wound 02/07/24 0900 Pressure Injury Foot Anterior;Right Stage 1 -  Intact skin with non-blanchable redness of a localized area usually over a bony prominence. (Active)     Wound 02/07/24 0900 Pressure Injury Foot Anterior;Left Stage 1 -  Intact skin with non-blanchable redness of a localized area usually over a bony prominence. (Active)     Wound 02/07/24 0900 Pressure Injury Buttocks Right Stage 1 -  Intact skin with non-blanchable redness of a localized area usually over a bony prominence. (Active)   DVT prophylaxis: On Eliquis  now Code Status: Full code Family Communication: None at bedside today. Disposition:  SNF when medically stable  Consultants:  Gastroenterology Critical care Cardiology Cardiothoracic surgery  Microbiology summarized: 10/5-MRSA PCR screen positive 10/15-trach aspirate culture with Candida albicans.  Objective: Vitals:   02/26/24 0406 02/26/24 0529 02/26/24 0744 02/26/24 0830  BP: (!) 148/64  (!) 155/72    Pulse: 69  71   Resp: 16  16 16   Temp: 98.4 F (36.9 C)  97.8 F (36.6 C)   TempSrc: Oral  Oral   SpO2: 100%  96%   Weight:  69.7 kg    Height:        Examination:  General appearance: Awake alert.  In no distress Resp: Clear to auscultation bilaterally.  Normal effort Cardio: S1-S2 is normal regular.  No S3-S4.  No rubs murmurs or bruit GI: Abdomen is soft.  Nontender nondistended.  Bowel sounds are present normal.  No masses organomegaly Extremities: No edema.  Moving all of his extremities. No obvious focal neurological deficits.   Sch Meds:  Scheduled Meds:  amiodarone   200 mg Per Tube BID   apixaban   5 mg Per Tube BID   bisacodyl   10 mg Rectal Q0600   Chlorhexidine  Gluconate Cloth  6 each Topical Daily   fentaNYL   1 patch Transdermal Q72H   fiber supplement (BANATROL TF)  60 mL Per Tube TID   fluconazole  200 mg Per Tube Daily   [name] butt cream   Topical BID   hydrALAZINE   50 mg Per Tube Q8H   HYDROmorphone    Intravenous Q4H   insulin  aspart  0-9 Units Subcutaneous Q4H   lidocaine   1 patch Transdermal Q24H   pantoprazole  (PROTONIX ) IV  40 mg Intravenous Q12H   saccharomyces boulardii  250 mg Per Tube  BID   sodium chloride  flush  10 mL Intrapleural Q8H   sodium chloride  flush  10-40 mL Intracatheter Q12H   Continuous Infusions:  feeding supplement (KATE FARMS STANDARD ENT 1.4) 1,000 mL (02/25/24 1234)   piperacillin -tazobactam (ZOSYN )  IV 3.375 g (02/26/24 0520)   promethazine (PHENERGAN) injection (IM or IVPB) Stopped (02/23/24 1843)   TPN ADULT (ION) 35 mL/hr at 02/25/24 1803   TPN ADULT (ION)     PRN Meds:.acetaminophen  (TYLENOL ) oral liquid 160 mg/5 mL, alum & mag hydroxide-simeth, diclofenac Sodium, diphenhydrAMINE **OR** diphenhydrAMINE, [COMPLETED] diphenoxylate-atropine **FOLLOWED BY** diphenoxylate-atropine, levalbuterol, lip balm, metoCLOPramide (REGLAN) injection, naloxone  **AND** sodium chloride  flush, mouth rinse, oxyCODONE , promethazine  (PHENERGAN) injection (IM or IVPB), sodium chloride  flush  Antimicrobials: Anti-infectives (From admission, onward)    Start     Dose/Rate Route Frequency Ordered Stop   02/22/24 1844  fluconazole (DIFLUCAN) tablet 200 mg        200 mg Per Tube Daily 02/22/24 1844 03/04/24 0959   02/22/24 1630  fluconazole (DIFLUCAN) tablet 200 mg  Status:  Discontinued        200 mg Oral Daily 02/22/24 1537 02/22/24 1844   02/13/24 0900  piperacillin -tazobactam (ZOSYN ) IVPB 3.375 g        3.375 g 12.5 mL/hr over 240 Minutes Intravenous Every 8 hours 02/13/24 0812     02/07/24 0800  vancomycin (VANCOCIN) IVPB 1000 mg/200 mL premix  Status:  Discontinued        1,000 mg 200 mL/hr over 60 Minutes Intravenous Every 24 hours 02/07/24 0632 02/09/24 0905   02/06/24 2200  meropenem (MERREM) 1 g in sodium chloride  0.9 % 100 mL IVPB  Status:  Discontinued        1 g 200 mL/hr over 30 Minutes Intravenous Every 12 hours 02/06/24 1127 02/13/24 0812   02/06/24 1300  vancomycin (VANCOREADY) IVPB 1750 mg/350 mL  Status:  Discontinued        1,750 mg 175 mL/hr over 120 Minutes Intravenous Every 48 hours 02/04/24 1225 02/04/24 1226   02/06/24 0800  vancomycin (VANCOCIN) IVPB 1000 mg/200 mL premix        1,000 mg 200 mL/hr over 60 Minutes Intravenous  Once 02/06/24 0629 02/07/24 0829   02/04/24 1400  meropenem (MERREM) 1 g in sodium chloride  0.9 % 100 mL IVPB  Status:  Discontinued        1 g 200 mL/hr over 30 Minutes Intravenous Every 8 hours 02/04/24 1208 02/06/24 1127   02/04/24 1300  linezolid  (ZYVOX ) IVPB 600 mg  Status:  Discontinued        600 mg 300 mL/hr over 60 Minutes Intravenous Every 12 hours 02/04/24 1202 02/04/24 1208   02/04/24 1300  vancomycin (VANCOREADY) IVPB 2000 mg/400 mL        2,000 mg 200 mL/hr over 120 Minutes Intravenous  Once 02/04/24 1208 02/04/24 1623   02/04/24 1208  vancomycin variable dose per unstable renal function (pharmacist dosing)  Status:  Discontinued         Does not apply  See admin instructions 02/04/24 1208 02/07/24 0632   01/31/24 1030  fluconazole (DIFLUCAN) IVPB 200 mg  Status:  Discontinued        200 mg 100 mL/hr over 60 Minutes Intravenous Daily 01/31/24 0938 02/20/24 1218   01/30/24 1445  piperacillin -tazobactam (ZOSYN ) IVPB 3.375 g  Status:  Discontinued        3.375 g 12.5 mL/hr over 240 Minutes Intravenous Every 8 hours 01/30/24 1354 02/04/24 1203  01/26/24 1000  amoxicillin-clavulanate (AUGMENTIN) 400-57 MG/5ML suspension 875 mg  Status:  Discontinued        875 mg Oral Every 12 hours 01/26/24 0658 01/30/24 1354   01/26/24 1000  fluconazole (DIFLUCAN) 40 MG/ML suspension 200 mg  Status:  Discontinued        200 mg Oral Daily 01/26/24 0658 01/31/24 0938   01/22/24 1445  fluconazole (DIFLUCAN) IVPB 400 mg  Status:  Discontinued        400 mg 100 mL/hr over 120 Minutes Intravenous Every 24 hours 01/22/24 1436 01/22/24 1439   01/22/24 1445  fluconazole (DIFLUCAN) IVPB 200 mg  Status:  Discontinued        200 mg 100 mL/hr over 60 Minutes Intravenous Every 24 hours 01/22/24 1439 01/26/24 0658   01/21/24 2200  piperacillin -tazobactam (ZOSYN ) IVPB 3.375 g  Status:  Discontinued       Placed in Followed by Linked Group   3.375 g 12.5 mL/hr over 240 Minutes Intravenous Every 8 hours 01/21/24 1607 01/26/24 0658   01/21/24 1615  piperacillin -tazobactam (ZOSYN ) IVPB 3.375 g       Placed in Followed by Linked Group   3.375 g 100 mL/hr over 30 Minutes Intravenous  Once 01/21/24 1607 01/21/24 1651        CBC: Recent Labs  Lab 02/22/24 0441 02/23/24 0403 02/24/24 0405 02/25/24 0500 02/26/24 0500  WBC 12.2* 11.1* 10.7* 11.1* 11.7*  HGB 12.7* 12.6* 12.0* 11.9* 12.3*  HCT 40.5 40.3 38.2* 38.0* 39.6  MCV 88.6 88.4 88.6 88.8 89.8  PLT 259 235 250 260 261   BMP &GFR Recent Labs  Lab 02/22/24 0441 02/23/24 0403 02/24/24 0405 02/25/24 0500 02/26/24 0500  NA 142 144 143 142 145  K 4.3 4.0 4.3 4.2 4.5  CL 103 101 102 105 109  CO2 26 24 26  24 22   GLUCOSE 114* 141* 145* 164* 153*  BUN 93* 106* 115* 105* 83*  CREATININE 2.51* 2.71* 2.98* 2.65* 2.26*  CALCIUM  9.2 9.0 8.9 8.9 8.9  MG 2.1 2.2 2.3 2.7* 2.8*  PHOS 3.5 3.8 4.4 4.1 3.4   Estimated Creatinine Clearance: 29.6 mL/min (A) (by C-G formula based on SCr of 2.26 mg/dL (H)).  Liver & Pancreas: Recent Labs  Lab 02/21/24 0633 02/22/24 0441 02/26/24 0500  AST  --  12* 15  ALT  --  15 18  ALKPHOS  --  70 71  BILITOT  --  0.5 0.5  PROT  --  6.8 6.3*  ALBUMIN  2.0* 2.0* 1.9*    Recent Labs  Lab 02/25/24 1546 02/25/24 1956 02/25/24 2309 02/26/24 0403 02/26/24 0825  GLUCAP 130* 120* 159* 122* 130*     Microbiology: No results found for this or any previous visit (from the past 240 hours).   Radiology Studies: No results found.  Kehlani Vancamp 02/26/2024  If 7PM-7AM, please contact night-coverage [email or web address] 02/26/2024, 10:23 AM

## 2024-02-26 NOTE — Plan of Care (Signed)
  Problem: Education: Goal: Knowledge of General Education information will improve Description: Including pain rating scale, medication(s)/side effects and non-pharmacologic comfort measures Outcome: Progressing   Problem: Coping: Goal: Level of anxiety will decrease Outcome: Not Progressing   Problem: Pain Managment: Goal: General experience of comfort will improve and/or be controlled Outcome: Not Progressing

## 2024-02-26 NOTE — Progress Notes (Signed)
 Advanced Heart Failure Rounding Note  Cardiologist: Dorn Lesches, MD  Chief Complaint: S/p esophageal stent Subjective:    - 10/6: esoph stent placement - 10/15: S/p EGD with stent repositioning by Dr. Shyrl  - 10/23: Ltd echo EF 60-65%  - 10/24: Barium swallow showed persistent leak  - 11/3: PEG tube placed  Remains HTN over the weekend. sCr 2.98>2.65>2.26.   Sitting up in bed. Remains frustrated by diarrhea and deconditioning. Has had long, complicated hospitalization. Stable from cardiac standpoint.   Objective:    Weight Range: 69.7 kg Body mass index is 20.27 kg/m.   Vital Signs:   Temp:  [97.6 F (36.4 C)-98.4 F (36.9 C)] 98.1 F (36.7 C) (11/10 1106) Pulse Rate:  [69-73] 69 (11/10 1106) Resp:  [14-20] 16 (11/10 1106) BP: (146-166)/(56-72) 146/56 (11/10 1106) SpO2:  [96 %-100 %] 100 % (11/10 1106) FiO2 (%):  [33 %] 33 % (11/09 1722) Weight:  [69.7 kg] 69.7 kg (11/10 0529) Last BM Date : 02/24/24  Weight change: Filed Weights   02/24/24 0552 02/25/24 0440 02/26/24 0529  Weight: 68.2 kg 74 kg 69.7 kg   Intake/Output:  Intake/Output Summary (Last 24 hours) at 02/26/2024 1149 Last data filed at 02/26/2024 0407 Gross per 24 hour  Intake --  Output 1250 ml  Net -1250 ml    Physical Exam   General: Chronically-ill appearing. No distress on [place] Cardiac: JVP flat. S1 and S2 present. No murmurs or rub. Extremities: Warm and dry.  No edema.  Neuro: Alert and oriented x3. Affect flat  Telemetry   SR 70s (personally reviewed)  Labs    CBC Recent Labs    02/25/24 0500 02/26/24 0500  WBC 11.1* 11.7*  HGB 11.9* 12.3*  HCT 38.0* 39.6  MCV 88.8 89.8  PLT 260 261   Basic Metabolic Panel Recent Labs    88/90/74 0500 02/26/24 0500  NA 142 145  K 4.2 4.5  CL 105 109  CO2 24 22  GLUCOSE 164* 153*  BUN 105* 83*  CREATININE 2.65* 2.26*  CALCIUM  8.9 8.9  MG 2.7* 2.8*  PHOS 4.1 3.4   Liver Function Tests Recent Labs    02/26/24 0500   AST 15  ALT 18  ALKPHOS 71  BILITOT 0.5  PROT 6.3*  ALBUMIN  1.9*   BNP (last 3 results) Recent Labs    03/09/23 1544 03/11/23 0218 06/27/23 1116  BNP 660.9* 491.3* 646.4*   Hemoglobin A1C No results for input(s): HGBA1C in the last 72 hours.  Fasting Lipid Panel No results for input(s): CHOL, HDL, LDLCALC, TRIG, CHOLHDL, LDLDIRECT in the last 72 hours.   Medications:    Scheduled Medications:  amiodarone   200 mg Per Tube BID   apixaban   5 mg Per Tube BID   bisacodyl   10 mg Rectal Q0600   Chlorhexidine  Gluconate Cloth  6 each Topical Daily   fentaNYL   1 patch Transdermal Q72H   fiber supplement (BANATROL TF)  60 mL Per Tube TID   fluconazole  200 mg Per Tube Daily   [name] butt cream   Topical BID   hydrALAZINE   50 mg Per Tube Q8H   HYDROmorphone    Intravenous Q4H   insulin  aspart  0-9 Units Subcutaneous Q4H   lidocaine   1 patch Transdermal Q24H   pantoprazole  (PROTONIX ) IV  40 mg Intravenous Q12H   saccharomyces boulardii  250 mg Per Tube BID   sodium chloride  flush  10 mL Intrapleural Q8H   sodium chloride  flush  10-40 mL Intracatheter Q12H    Infusions:  feeding supplement (KATE FARMS STANDARD ENT 1.4) 1,000 mL (02/25/24 1234)   piperacillin -tazobactam (ZOSYN )  IV 3.375 g (02/26/24 0520)   promethazine (PHENERGAN) injection (IM or IVPB) Stopped (02/23/24 1843)   TPN ADULT (ION) 35 mL/hr at 02/25/24 1803   TPN ADULT (ION)      PRN Medications: acetaminophen  (TYLENOL ) oral liquid 160 mg/5 mL, alum & mag hydroxide-simeth, diclofenac Sodium, diphenhydrAMINE **OR** diphenhydrAMINE, [COMPLETED] diphenoxylate-atropine **FOLLOWED BY** diphenoxylate-atropine, levalbuterol, lip balm, metoCLOPramide (REGLAN) injection, naloxone  **AND** sodium chloride  flush, mouth rinse, oxyCODONE , promethazine (PHENERGAN) injection (IM or IVPB), sodium chloride  flush  Patient Profile    Patient with longstanding history of chronic heart failure with preserved  ejection fraction, RV failure with history of pulmonary embolism presents with esophageal perforation.  S/P Stent Esophageal Stent. Stent repositioned 10/15.  Assessment/Plan   1.  Esophageal Perforation - Boerhaave syndrome.  - CT C/A/P: with pneumomediastinum and large mass-like density in lower esophagus - 10/6: EGD with esophageal stent placed  - 10/15: Stent migrated w/ leak > back to OR for repositioning  - 10/24: barium swallow w persistent leak  - 11/3: PEG placed - on TF and meds by PEG - continues on fluconazole with stent in place  2. Acute on chronic HFpEF: h/o cardiogenic shock with RV failure in the setting of cardiac arrest thought to be caused by acute PE vs ACS in 3/25. Echo at the time showed EF 55%, severe, LVH, G2DD, and severely reduced RV function with strain. There was concern for cardiac sarcoid amyloid, however CMR LGE consistent with ischemic disease. RV on echo 9/25 with complete recovery, EF 60-65%, nl RV function.    - RHC 3/25: Mild PAH with severe RV failure though CO is preserved.   - Repeat echo 10/25 EF 60-65% - Appears hypovolemic, hold diuretics - stop entresto  with rising Cr - increase hydral to 100 mg tid, unable to use imdur  (cannot be crushed) - Continue TED hose  3. 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 Medical management.  - suspect CP related to esophageal issues and not CAD - No s/s angina   4.  AKI on CKD Stage 3b: Baseline sCr ~ 2.1. - Bumped creatinine to 2.9, now 2.2 - stop ARNI, hold diuretics  5. PAF:   - s/p TEE/DCCV 3/25 - has converted to AF a couple times this admit - continue amio 200 mg bid - continue eliquis  5 mg bid - in NSR on tele    6.  H/o DVT/PE:  Bilateral upper and lower DVTs, mixed acute and chronic.  - s/p infrarenal IVC placement 3/25 - V/Q 3/25 with multiple perfusion defects - continue eliquis    7. Carotid stenosis: h/o CVA. TCAR in  6/24. Okay to remain off plavix  at this point. - carotid US  9/25: widely patent carotid stent on R and no stenosis on L   8. Acute Hypoxic Respiratory Failure/PNA/ Exudative Lt Pleural Effusion  - on zosyn   - CT placed for pleural effusion. Resp Cx w/ rare yeast, BCx NGTD  - remains on [place] - [name] managing  9. Severe protein calorie malnutrition - Nutrition following. On TPN qHS - now with PEG tube + TF  10. HTN - med changes as abobe  Chia Rock, NP 02/26/2024  Advanced Heart Failure Team Pager [phone] (M-F; 7a - 5p)  Please contact [facility] for night-coverage after hours (4p -  7a ) and weekends on [email or web address]

## 2024-02-27 DIAGNOSIS — K223 Perforation of esophagus: Secondary | ICD-10-CM | POA: Diagnosis not present

## 2024-02-27 DIAGNOSIS — E43 Unspecified severe protein-calorie malnutrition: Secondary | ICD-10-CM | POA: Diagnosis not present

## 2024-02-27 LAB — GLUCOSE, CAPILLARY
Glucose-Capillary: 153 mg/dL — ABNORMAL HIGH (ref 70–99)
Glucose-Capillary: 144 mg/dL — ABNORMAL HIGH (ref 70–99)
Glucose-Capillary: 139 mg/dL — ABNORMAL HIGH (ref 70–99)
Glucose-Capillary: 138 mg/dL — ABNORMAL HIGH (ref 70–99)
Glucose-Capillary: 123 mg/dL — ABNORMAL HIGH (ref 70–99)
Glucose-Capillary: 135 mg/dL — ABNORMAL HIGH (ref 70–99)

## 2024-02-27 LAB — MAGNESIUM: Magnesium: 2.7 mg/dL — ABNORMAL HIGH (ref 1.7–2.4)

## 2024-02-27 MED ORDER — LOSARTAN POTASSIUM 25 MG PO TABS
25.0000 mg | ORAL_TABLET | Freq: Every day | ORAL | Status: DC
  Administered 2024-02-28 – 2024-03-01 (×3): 25 mg
  Filled 2024-02-27 (×3): qty 1

## 2024-02-27 MED ORDER — VITAL 1.5 CAL PO LIQD
1000.0000 mL | ORAL | Status: DC
  Administered 2024-02-27 – 2024-03-01 (×5): 1000 mL
  Filled 2024-02-27 (×7): qty 1000

## 2024-02-27 MED ORDER — DIPHENHYDRAMINE HCL 12.5 MG/5ML PO ELIX: 12.5000 mg | ORAL_SOLUTION | Freq: Four times a day (QID) | ORAL | Status: DC | PRN

## 2024-02-27 MED ORDER — HYDROMORPHONE HCL 1 MG/ML IJ SOLN
0.5000 mg | INTRAMUSCULAR | Status: DC | PRN
  Administered 2024-02-27 – 2024-03-18 (×104): 0.5 mg via INTRAVENOUS
  Filled 2024-02-27 (×104): qty 0.5

## 2024-02-27 MED ORDER — DIPHENHYDRAMINE HCL 50 MG/ML IJ SOLN: 12.5000 mg | Freq: Four times a day (QID) | INTRAMUSCULAR | Status: DC | PRN

## 2024-02-27 MED ORDER — TRAVASOL 10 % IV SOLN
INTRAVENOUS | Status: AC
  Filled 2024-02-27: qty 430

## 2024-02-27 MED ORDER — LOSARTAN POTASSIUM 25 MG PO TABS
25.0000 mg | ORAL_TABLET | Freq: Every day | ORAL | Status: DC
  Administered 2024-02-27: 25 mg via ORAL
  Filled 2024-02-27: qty 1

## 2024-02-27 NOTE — Progress Notes (Addendum)
      [address] [place] [address]             [phone]     Will remove Chest tube today. Hopefully this will help with pain control. Does not need PCA from thoracic surgery perspective         Wayne E Gold, PA-C   Will plan for EGD and stent removal in 2 weeks.  Continue abx until then  [employer]

## 2024-02-27 NOTE — Progress Notes (Addendum)
 PROGRESS NOTE  Billy [name] FMW:[id number] DOB: September 26, 1950   PCP: Seabron Lenis, MD  Patient is from: Home.  DOA: 01/21/2024 LOS: 37   Brief Narrative / Interim history: 73 year old M with PMH of cardiac arrest earlier this year along with HFrEF, Afib, PE presented with several days of nausea and vomiting from a stomach bug followed by sudden onset severe tearing epigastric pain radiating to back and chest. Workup revealed esophageal tear/Boerhaave. Patient had esophageal stent placed on 10/7.  Subsequent swallow study revealed an additional leak, and he had stent repositioned on 01/31/2024.  He became hypoxic with gastric contents noted on intubation, and he was transferred to ICU for mechanical ventilation.  Bedside ultrasound with right pleural effusion for which he had chest tube placed with removal of 300 cc exudative fluid.  He was also successfully extubated on 10/16.  Patient has had increased oxygen  requirement requiring reintubation on 10/20.  Bedside ultrasound showed left-sided pleural effusion and he had left-sided chest tube with 120 cc exudative output. ICU stay complicated by high O2 requirement, left sided pleural effusion, chest tube placement 10/16, reintubated 10/20.  Eventually, patient was extubated and transferred out of ICU on 10/28. Hospital course complicated by acute on chronic HFpEF for which he is on IV Lasix  per advanced heart failure team. PEG tube placed on 11/3.  Dietitian consulted for initiation of tube feed.  Remains on TPN until tube feed at goal.  Subjective: No new complaints offered.  Nausea is better.  Diarrhea is well-controlled.    Assessment and plan:  Esophageal rupture/ Boerhaave syndrome Likely from excessive vomiting -S/p stent placement on 10/7 -S/p stent repositioning on 10/16 due to leakage -Esophagogram 10/24 shows persistent leak -PEG tube placed on 11/3.  RD consulted for initiation of TF -Continue TPN until TF at goal Patient had  bilateral chest tubes.  He accidentally pulled out the right chest tube on 11/7.  Patient was seen by cardiothoracic surgery.  They are leaving the tube out.   He continues to have left-sided chest tube.  Cardiothoracic is managing.   Was found to have some erythema and purulent drainage from the right sided chest tube site.  He remains on Zosyn .  Duration of antibiotics per cardiothoracic.  Acute hypoxic/ hypercapnic respiratory failure- Multifactorial including aspiration pneumonia, pleural effusion and CHF.  Trach aspirate on 10/15 with Candida albicans. -IV meropenem>> IV Zosyn  for pneumonia per CTS. -Diflucan for Candida albicans from trach aspirate from 10/15-11/4. -Continue bronchodilators Respiratory status is stable   Acute on chronic diastolic CHF Limited TTE on 10/23 with LVEF of 60 to 65%.  RHC in 3/25 with mild PAH with severe RV failure but preserved CO.  Appears euvolemic on exam. Heart failure team has been following.  Patient was started on diuretics and Entresto  however these have been held due to increase in creatinine.   Patient now on hydralazine  with plans to initiate nitrates when he is able to take orally.   AKI on CKD-3B:  Baseline Cr~2.0.   Renal function worsened with diuretics and Entresto  both of which were held.  Now improving. Monitor urine output.  Avoid nephrotoxic agents.   Check labs periodically.   Paroxysmal A-fib:  s/p TEE DCCV 3/25.  Seems to be in sinus rhythm. Transitioned to p.o. amiodarone  and Eliquis    Intractable pain Required ketamine and started on IV Dilaudid  PCA while in ICU. Patient on as needed oxycodone  as well.   Patient was started on fentanyl  patch by the nocturnist on 11/10.  Remains on Dilaudid  PCA.  Continues to require PCA pump.  Patient is trying to decrease the use of PCA.  H/o PE/ DVT-  S/p infrarenal IVC placement 3/25. V/Q 3/25 with multiple perfusion defects Now on Eliquis .  Essential hypertension:  Blood pressure is  reasonably well-controlled.  Patient is currently on hydralazine .  Nausea and diarrhea Patient with persistent nausea and diarrhea.  Medication changes were made.  He is now on Phenergan and Lomotil as needed.  Diarrhea appears to have subsided.  Continue probiotics.    Unwitnessed fall in the hospital:  Found down on the floor the evening of 10/31.  No apparent injury or focal neurodeficit. Fall precaution  Anxiety disorder:  Stable. Ativan  as needed   Acute on chronic anemia Hemoglobin has been stable.  No evidence of overt blood loss   GERD - IV PPI.  Severe protein calorie malnutrition/hypoalbuminemia Body mass index is 20.62 kg/m. Nutrition Problem: Severe Malnutrition Etiology: acute illness (may have chronic component due to time frame of reported wt loss) Signs/Symptoms: severe fat depletion, moderate muscle depletion Interventions: Refer to RD note for recommendations Patient was started on TPN.  G-tube was subsequently placed.  Now on tube feedings.  TPN being weaned down gradually.  Pressure skin injury Wound 02/07/24 0900 Pressure Injury Foot Anterior;Right Stage 1 -  Intact skin with non-blanchable redness of a localized area usually over a bony prominence. (Active)     Wound 02/07/24 0900 Pressure Injury Foot Anterior;Left Stage 1 -  Intact skin with non-blanchable redness of a localized area usually over a bony prominence. (Active)     Wound 02/07/24 0900 Pressure Injury Buttocks Right Stage 1 -  Intact skin with non-blanchable redness of a localized area usually over a bony prominence. (Active)   DVT prophylaxis: On Eliquis  now Code Status: Full code Family Communication: None at bedside today. Disposition:  SNF when medically stable  Consultants:  Gastroenterology Critical care Cardiology Cardiothoracic surgery  Microbiology summarized: 10/5-MRSA PCR screen positive 10/15-trach aspirate culture with Candida albicans.  Objective: Vitals:   02/27/24  0424 02/27/24 0425 02/27/24 0725 02/27/24 0807  BP: (!) 161/67 (!) 163/58  (!) 158/61  Pulse: 68 72  76  Resp: 15 19 20 17   Temp: 97.8 F (36.6 C) 97.8 F (36.6 C)  98 F (36.7 C)  TempSrc: Oral Oral  Oral  SpO2: 96% 94%  95%  Weight:      Height:        Examination:  General appearance: Awake alert.  In no distress Resp: Clear to auscultation bilaterally.  Normal effort Left-sided chest tube is noted Cardio: S1-S2 is normal regular.  No S3-S4.  No rubs murmurs or bruit GI: Abdomen is soft.  Nontender nondistended.  Bowel sounds are present normal.  No masses organomegaly.  PEG tube is present Extremities: No edema.  Full range of motion of lower extremities. Neurologic: Alert and oriented x3.  No focal neurological deficits.     Sch Meds:  Scheduled Meds:  amiodarone   200 mg Per Tube BID   apixaban   5 mg Per Tube BID   Chlorhexidine  Gluconate Cloth  6 each Topical Daily   fentaNYL   1 patch Transdermal Q72H   fiber supplement (BANATROL TF)  60 mL Per Tube QID   fluconazole  200 mg Per Tube Daily   [name] butt cream   Topical BID   hydrALAZINE   100 mg Per Tube Q8H   HYDROmorphone    Intravenous Q4H   insulin  aspart  0-9  Units Subcutaneous Q4H   lidocaine   1 patch Transdermal Q24H   pantoprazole  (PROTONIX ) IV  40 mg Intravenous Q12H   saccharomyces boulardii  250 mg Per Tube BID   sodium chloride  flush  10 mL Intrapleural Q8H   sodium chloride  flush  10-40 mL Intracatheter Q12H   Continuous Infusions:  feeding supplement (KATE FARMS STANDARD ENT 1.4) 1,000 mL (02/26/24 1449)   piperacillin -tazobactam (ZOSYN )  IV 3.375 g (02/27/24 0550)   promethazine (PHENERGAN) injection (IM or IVPB) 12.5 mg (02/26/24 1153)   TPN ADULT (ION) 35 mL/hr at 02/27/24 0503   TPN ADULT (ION)     PRN Meds:.acetaminophen  (TYLENOL ) oral liquid 160 mg/5 mL, alum & mag hydroxide-simeth, diclofenac Sodium, diphenhydrAMINE **OR** diphenhydrAMINE, [COMPLETED] diphenoxylate-atropine **FOLLOWED  BY** diphenoxylate-atropine, levalbuterol, lip balm, metoCLOPramide (REGLAN) injection, naloxone  **AND** sodium chloride  flush, mouth rinse, oxyCODONE , promethazine (PHENERGAN) injection (IM or IVPB), simethicone , sodium chloride  flush  Antimicrobials: Anti-infectives (From admission, onward)    Start     Dose/Rate Route Frequency Ordered Stop   02/22/24 1844  fluconazole (DIFLUCAN) tablet 200 mg        200 mg Per Tube Daily 02/22/24 1844 03/04/24 0959   02/22/24 1630  fluconazole (DIFLUCAN) tablet 200 mg  Status:  Discontinued        200 mg Oral Daily 02/22/24 1537 02/22/24 1844   02/13/24 0900  piperacillin -tazobactam (ZOSYN ) IVPB 3.375 g        3.375 g 12.5 mL/hr over 240 Minutes Intravenous Every 8 hours 02/13/24 0812     02/07/24 0800  vancomycin (VANCOCIN) IVPB 1000 mg/200 mL premix  Status:  Discontinued        1,000 mg 200 mL/hr over 60 Minutes Intravenous Every 24 hours 02/07/24 0632 02/09/24 0905   02/06/24 2200  meropenem (MERREM) 1 g in sodium chloride  0.9 % 100 mL IVPB  Status:  Discontinued        1 g 200 mL/hr over 30 Minutes Intravenous Every 12 hours 02/06/24 1127 02/13/24 0812   02/06/24 1300  vancomycin (VANCOREADY) IVPB 1750 mg/350 mL  Status:  Discontinued        1,750 mg 175 mL/hr over 120 Minutes Intravenous Every 48 hours 02/04/24 1225 02/04/24 1226   02/06/24 0800  vancomycin (VANCOCIN) IVPB 1000 mg/200 mL premix        1,000 mg 200 mL/hr over 60 Minutes Intravenous  Once 02/06/24 0629 02/07/24 0829   02/04/24 1400  meropenem (MERREM) 1 g in sodium chloride  0.9 % 100 mL IVPB  Status:  Discontinued        1 g 200 mL/hr over 30 Minutes Intravenous Every 8 hours 02/04/24 1208 02/06/24 1127   02/04/24 1300  linezolid  (ZYVOX ) IVPB 600 mg  Status:  Discontinued        600 mg 300 mL/hr over 60 Minutes Intravenous Every 12 hours 02/04/24 1202 02/04/24 1208   02/04/24 1300  vancomycin (VANCOREADY) IVPB 2000 mg/400 mL        2,000 mg 200 mL/hr over 120 Minutes  Intravenous  Once 02/04/24 1208 02/04/24 1623   02/04/24 1208  vancomycin variable dose per unstable renal function (pharmacist dosing)  Status:  Discontinued         Does not apply See admin instructions 02/04/24 1208 02/07/24 0632   01/31/24 1030  fluconazole (DIFLUCAN) IVPB 200 mg  Status:  Discontinued        200 mg 100 mL/hr over 60 Minutes Intravenous Daily 01/31/24 0938 02/20/24 1218   01/30/24 1445  piperacillin -tazobactam (  ZOSYN ) IVPB 3.375 g  Status:  Discontinued        3.375 g 12.5 mL/hr over 240 Minutes Intravenous Every 8 hours 01/30/24 1354 02/04/24 1203   01/26/24 1000  amoxicillin-clavulanate (AUGMENTIN) 400-57 MG/5ML suspension 875 mg  Status:  Discontinued        875 mg Oral Every 12 hours 01/26/24 0658 01/30/24 1354   01/26/24 1000  fluconazole (DIFLUCAN) 40 MG/ML suspension 200 mg  Status:  Discontinued        200 mg Oral Daily 01/26/24 0658 01/31/24 0938   01/22/24 1445  fluconazole (DIFLUCAN) IVPB 400 mg  Status:  Discontinued        400 mg 100 mL/hr over 120 Minutes Intravenous Every 24 hours 01/22/24 1436 01/22/24 1439   01/22/24 1445  fluconazole (DIFLUCAN) IVPB 200 mg  Status:  Discontinued        200 mg 100 mL/hr over 60 Minutes Intravenous Every 24 hours 01/22/24 1439 01/26/24 0658   01/21/24 2200  piperacillin -tazobactam (ZOSYN ) IVPB 3.375 g  Status:  Discontinued       Placed in Followed by Linked Group   3.375 g 12.5 mL/hr over 240 Minutes Intravenous Every 8 hours 01/21/24 1607 01/26/24 0658   01/21/24 1615  piperacillin -tazobactam (ZOSYN ) IVPB 3.375 g       Placed in Followed by Linked Group   3.375 g 100 mL/hr over 30 Minutes Intravenous  Once 01/21/24 1607 01/21/24 1651        CBC: Recent Labs  Lab 02/22/24 0441 02/23/24 0403 02/24/24 0405 02/25/24 0500 02/26/24 0500  WBC 12.2* 11.1* 10.7* 11.1* 11.7*  HGB 12.7* 12.6* 12.0* 11.9* 12.3*  HCT 40.5 40.3 38.2* 38.0* 39.6  MCV 88.6 88.4 88.6 88.8 89.8  PLT 259 235 250 260 261   BMP  &GFR Recent Labs  Lab 02/22/24 0441 02/23/24 0403 02/24/24 0405 02/25/24 0500 02/26/24 0500 02/27/24 0521  NA 142 144 143 142 145  --   K 4.3 4.0 4.3 4.2 4.5  --   CL 103 101 102 105 109  --   CO2 26 24 26 24 22   --   GLUCOSE 114* 141* 145* 164* 153*  --   BUN 93* 106* 115* 105* 83*  --   CREATININE 2.51* 2.71* 2.98* 2.65* 2.26*  --   CALCIUM  9.2 9.0 8.9 8.9 8.9  --   MG 2.1 2.2 2.3 2.7* 2.8* 2.7*  PHOS 3.5 3.8 4.4 4.1 3.4  --    Estimated Creatinine Clearance: 30.1 mL/min (A) (by C-G formula based on SCr of 2.26 mg/dL (H)).  Liver & Pancreas: Recent Labs  Lab 02/21/24 0633 02/22/24 0441 02/26/24 0500  AST  --  12* 15  ALT  --  15 18  ALKPHOS  --  70 71  BILITOT  --  0.5 0.5  PROT  --  6.8 6.3*  ALBUMIN  2.0* 2.0* 1.9*    Recent Labs  Lab 02/26/24 1548 02/26/24 1951 02/26/24 2345 02/27/24 0421 02/27/24 0808  GLUCAP 107* 134* 127* 153* 144*     Microbiology: No results found for this or any previous visit (from the past 240 hours).   Radiology Studies: No results found.  Takiera Mayo 02/27/2024  If 7PM-7AM, please contact night-coverage [email or web address] 02/27/2024, 10:33 AM

## 2024-02-27 NOTE — Plan of Care (Signed)
  Problem: Education: Goal: Knowledge of General Education information will improve Description: Including pain rating scale, medication(s)/side effects and non-pharmacologic comfort measures Outcome: Progressing   Problem: Health Behavior/Discharge Planning: Goal: Ability to manage health-related needs will improve Outcome: Progressing   Problem: Clinical Measurements: Goal: Ability to maintain clinical measurements within normal limits will improve Outcome: Progressing Goal: Will remain free from infection Outcome: Progressing Goal: Diagnostic test results will improve Outcome: Progressing Goal: Respiratory complications will improve Outcome: Progressing Goal: Cardiovascular complication will be avoided Outcome: Progressing   Problem: Activity: Goal: Risk for activity intolerance will decrease Outcome: Progressing   Problem: Nutrition: Goal: Adequate nutrition will be maintained Outcome: Progressing   Problem: Coping: Goal: Level of anxiety will decrease Outcome: Progressing   Problem: Elimination: Goal: Will not experience complications related to bowel motility Outcome: Progressing Goal: Will not experience complications related to urinary retention Outcome: Progressing   Problem: Pain Managment: Goal: General experience of comfort will improve and/or be controlled Outcome: Progressing   Problem: Safety: Goal: Ability to remain free from injury will improve Outcome: Progressing   Problem: Skin Integrity: Goal: Risk for impaired skin integrity will decrease Outcome: Progressing   Problem: Education: Goal: Ability to describe self-care measures that may prevent or decrease complications (Diabetes Survival Skills Education) will improve Outcome: Progressing   Problem: Coping: Goal: Ability to adjust to condition or change in health will improve Outcome: Progressing   Problem: Fluid Volume: Goal: Ability to maintain a balanced intake and output will  improve Outcome: Progressing   Problem: Health Behavior/Discharge Planning: Goal: Ability to identify and utilize available resources and services will improve Outcome: Progressing Goal: Ability to manage health-related needs will improve Outcome: Progressing   Problem: Metabolic: Goal: Ability to maintain appropriate glucose levels will improve Outcome: Progressing   Problem: Nutritional: Goal: Maintenance of adequate nutrition will improve Outcome: Progressing Goal: Progress toward achieving an optimal weight will improve Outcome: Progressing   Problem: Skin Integrity: Goal: Risk for impaired skin integrity will decrease Outcome: Progressing   Problem: Tissue Perfusion: Goal: Adequacy of tissue perfusion will improve Outcome: Progressing   Problem: Activity: Goal: Ability to tolerate increased activity will improve Outcome: Progressing   Problem: Respiratory: Goal: Ability to maintain a clear airway and adequate ventilation will improve Outcome: Progressing   Problem: Role Relationship: Goal: Method of communication will improve Outcome: Progressing   Problem: Education: Goal: Ability to describe self-care measures that may prevent or decrease complications (Diabetes Survival Skills Education) will improve Outcome: Progressing Goal: Individualized Educational Video(s) Outcome: Progressing   Problem: Coping: Goal: Ability to adjust to condition or change in health will improve Outcome: Progressing   Problem: Fluid Volume: Goal: Ability to maintain a balanced intake and output will improve Outcome: Progressing   Problem: Health Behavior/Discharge Planning: Goal: Ability to identify and utilize available resources and services will improve Outcome: Progressing Goal: Ability to manage health-related needs will improve Outcome: Progressing   Problem: Metabolic: Goal: Ability to maintain appropriate glucose levels will improve Outcome: Progressing   Problem:  Nutritional: Goal: Maintenance of adequate nutrition will improve Outcome: Progressing Goal: Progress toward achieving an optimal weight will improve Outcome: Progressing   Problem: Skin Integrity: Goal: Risk for impaired skin integrity will decrease Outcome: Progressing   Problem: Tissue Perfusion: Goal: Adequacy of tissue perfusion will improve Outcome: Progressing

## 2024-02-27 NOTE — Progress Notes (Signed)
 Occupational Therapy Treatment Patient Details Name: Billy [name] MRN: [id number] DOB: 05/08/1950 Today's Date: 02/27/2024   History of present illness Billy [name] is a 73 y.o. male who presented to [facility] ED 01/21/24 with several days of vomiting followed by an episode of severe tearing epigastric pain. Work-up revealed esophageal tear. Pt s/p esophageal stent with EGD 10/7. Return to OR for esophageal stent adjustment on 10/15, remained intubated after procedure due to suspected aspiration. Extubated 10/16.  Pt had CT placed 10/16. Reintubated 10/20-10/24. G tube placed 11/3. He accidentally pulled out the R chest tube on 11/7. MD plan for  PMHx: HFrEF, AFib, CAD, PE complicated by cardiogenic shock, CKD 4, MI, peripheral neuropathy, and TIA.   OT comments  Pt making good progress towards OT goals, premedicated for pain prior to therapy attempt. Pt able to progress gait a further distance than prior sessions though continues to benefit from Min A x 2 for safety, line mgmt and DME mgmt. Third person present for close chair follow as well. Pt able to assist with UB ADLs though limited by multiple lines present. Patient will benefit from continued inpatient follow up therapy, <3 hours/day at DC.      If plan is discharge home, recommend the following:  Two people to help with walking and/or transfers;Assistance with cooking/housework;Direct supervision/assist for medications management;Direct supervision/assist for financial management;Assist for transportation;Help with stairs or ramp for entrance;A lot of help with bathing/dressing/bathroom   Equipment Recommendations  Other (comment) (TBD)    Recommendations for Other Services      Precautions / Restrictions Precautions Precautions: Fall Recall of Precautions/Restrictions: Impaired Precaution/Restrictions Comments: L chest tube, G-tube, PCA pump, check BP with postural changes if symptomatic. Restrictions Weight Bearing Restrictions Per Provider  Order: No       Mobility Bed Mobility Overal bed mobility: Needs Assistance Bed Mobility: Supine to Sit, Sit to Supine     Supine to sit: Contact guard, HOB elevated, Used rails Sit to supine: Contact guard assist, +2 for safety/equipment, HOB elevated, Used rails   General bed mobility comments: Increased time to perform and rest break before postural changes, but improved initiation this date. Assist for line mgmt, +2 at times for lines due to plethora of equipment/lines.    Transfers Overall transfer level: Needs assistance Equipment used: Rolling walker (2 wheels) Transfers: Sit to/from Stand Sit to Stand: Contact guard assist, +2 safety/equipment           General transfer comment: From EOB>RW and RW<>chair, then back to EOB from RW. Cues for safe UE placement prior to each transfer, +2 safety due to lines.     Balance Overall balance assessment: Needs assistance Sitting-balance support: No upper extremity supported, Feet supported Sitting balance-Leahy Scale: Good     Standing balance support: Bilateral upper extremity supported, Reliant on assistive device for balance Standing balance-Leahy Scale: Poor                             ADL either performed or assessed with clinical judgement   ADL Overall ADL's : Needs assistance/impaired     Grooming: Modified independent;Bed level;Wash/dry face           Upper Body Dressing : Minimal assistance;Bed level                   Functional mobility during ADLs: Minimal assistance;+2 for physical assistance;+2 for safety/equipment;Rolling walker (2 wheels);Cueing for sequencing General ADL Comments: Emphasis on mobility  progression with PT w/ pt able to ambulate further distance w/ close chair follow    Extremity/Trunk Assessment Upper Extremity Assessment Upper Extremity Assessment: Generalized weakness;Right hand dominant   Lower Extremity Assessment Lower Extremity Assessment: Defer to PT  evaluation        Vision   Vision Assessment?: No apparent visual deficits   Perception     Praxis     Communication Communication Communication: No apparent difficulties   Cognition Arousal: Alert Behavior During Therapy: WFL for tasks assessed/performed, Anxious Cognition: Cognition impaired     Awareness: Online awareness impaired Memory impairment (select all impairments): Declarative long-term memory Attention impairment (select first level of impairment): Sustained attention Executive functioning impairment (select all impairments): Reasoning, Problem solving OT - Cognition Comments: poor overall awareness, easily anxious or agitated, particular about task completion. memory deficits as pt unsure where room was and cues needed for problem solving                 Following commands: Impaired Following commands impaired: Follows one step commands with increased time      Cueing   Cueing Techniques: Verbal cues, Gestural cues  Exercises      Shoulder Instructions       General Comments BP initially elevated but nursing in to give BP meds w/ some improvements noted at end of session. SpO2 93% on RA, HR 100s    Pertinent Vitals/ Pain       Pain Assessment Pain Assessment: Faces Faces Pain Scale: Hurts a little bit Pain Location: chest tube site and neck Pain Descriptors / Indicators: Sore, Grimacing, Guarding, Discomfort Pain Intervention(s): Monitored during session  Home Living                                          Prior Functioning/Environment              Frequency  Min 2X/week        Progress Toward Goals  OT Goals(current goals can now be found in the care plan section)  Progress towards OT goals: Progressing toward goals  Acute Rehab OT Goals OT Goal Formulation: With patient Time For Goal Achievement: 03/04/24 Potential to Achieve Goals: Fair ADL Goals Pt Will Perform Grooming: with  supervision;standing Pt Will Perform Lower Body Bathing: with supervision;sit to/from stand Pt Will Perform Upper Body Dressing: with min assist;sitting Pt Will Perform Lower Body Dressing: with supervision;sit to/from stand Pt Will Transfer to Toilet: with contact guard assist;stand pivot transfer;bedside commode Pt Will Perform Toileting - Clothing Manipulation and hygiene: with min assist;sitting/lateral leans;sit to/from stand Pt/caregiver will Perform Home Exercise Program: Increased strength;Both right and left upper extremity;With theraband Additional ADL Goal #1: Pt to complete bed mobility with Min A in prep for EOB/OOB ADLs Additional ADL Goal #2: Pt to increase standing tolerance > 8 min during ADLs/mobility without need for seated rest break  Plan      Co-evaluation    PT/OT/SLP Co-Evaluation/Treatment: Yes Reason for Co-Treatment: Complexity of the patient's impairments (multi-system involvement);For patient/therapist safety;To address functional/ADL transfers PT goals addressed during session: Mobility/safety with mobility;Balance;Proper use of DME OT goals addressed during session: ADL's and self-care;Strengthening/ROM      AM-PAC OT 6 Clicks Daily Activity     Outcome Measure   Help from another person eating meals?: Total (NPO) Help from another person taking care of personal grooming?: A Little Help  from another person toileting, which includes using toliet, bedpan, or urinal?: Total Help from another person bathing (including washing, rinsing, drying)?: A Lot Help from another person to put on and taking off regular upper body clothing?: A Lot Help from another person to put on and taking off regular lower body clothing?: A Lot 6 Click Score: 11    End of Session Equipment Utilized During Treatment: Rolling walker (2 wheels);Gait belt  OT Visit Diagnosis: Muscle weakness (generalized) (M62.81);Pain   Activity Tolerance Patient tolerated treatment well    Patient Left in bed;with call bell/phone within reach;with nursing/sitter in room   Nurse Communication Mobility status        Time: 8744-8672 OT Time Calculation (min): 32 min  Charges: OT General Charges $OT Visit: 1 Visit OT Treatments $Therapeutic Activity: 8-22 mins  Mliss NOVAK, OTR/L Acute Rehab Services Office: [phone]   Mliss Fish 02/27/2024, 2:16 PM

## 2024-02-27 NOTE — TOC Progression Note (Signed)
 Transition of Care [facility]) - Progression Note    Patient Details  Name: Billy [name] MRN: [id number] Date of Birth: [birth year]  Transition of Care [facility]) CM/SW Contact  Justina Delcia Czar, RN Phone Number: [phone] 02/27/2024, 1:50 PM  Clinical Narrative:    Patient remains on IV abx, and TPN. PT is working with patient and recommendations for IP rehab or SNF rehab.   HF CM/CSW will continue to follow for dc needs.   Chart reviewed for discharge readiness, patient not medically stable for d/c. Inpatient CM/CSW will continue to monitor pt's advancement through interdisciplinary progression rounds.   If new pt transition needs arise, MD please place a TOC consult.     Expected Discharge Plan: Skilled Nursing Facility Barriers to Discharge: Continued Medical Work up   Expected Discharge Plan and Services In-house Referral: Clinical Social Work Discharge Planning Services: CM Consult Post Acute Care Choice: Skilled Nursing Facility Living arrangements for the past 2 months: Single Family Home                    Social Drivers of Health (SDOH) Interventions SDOH Screenings   Food Insecurity: No Food Insecurity (01/22/2024)  Housing: High Risk (01/22/2024)  Transportation Needs: No Transportation Needs (01/22/2024)  Utilities: Not At Risk (01/22/2024)  Depression (PHQ2-9): Low Risk  (12/01/2020)  Social Connections: Socially Isolated (01/22/2024)  Tobacco Use: Low Risk  (02/19/2024)    Readmission Risk Interventions    06/27/2023    1:52 PM 10/14/2022   11:29 AM  Readmission Risk Prevention Plan  Post Dischage Appt  Complete  Medication Screening  Complete  Transportation Screening Complete Complete  HRI or Home Care Consult Complete   Social Work Consult for Recovery Care Planning/Counseling Complete   Palliative Care Screening Not Applicable   Medication Review Oceanographer) Referral to Pharmacy

## 2024-02-27 NOTE — Progress Notes (Signed)
 Nutrition Follow-up  DOCUMENTATION CODES:   Severe malnutrition in context of acute illness/injury  INTERVENTION:   TPN to be d/c tomorrow   Modify TF via PEG: -Vital 1.5 to 60 ml/h (1,4400 ml per day)             - Continue Banatrol to QID d/t diarrhea-provides 5g soluble fiber per serving -Provides 2160 kcal (94% estimated needs), 97 gm protein (88% estimated needs), 1100 ml free water  daily    When pt establishes tolerance and up to goal rate via PEG: -Add Prosource TF20 60ml daily -Recommend low volume FWF when able - Add MVI w/ minerals when TPN stopped -Provides 2240 kcal, 117 gm protein, 1108 ml free water  daily   NUTRITION DIAGNOSIS:  Severe Malnutrition related to acute illness (may have chronic component due to time frame of reported wt loss) as evidenced by severe fat depletion, moderate muscle depletion.  GOAL:  Patient will meet greater than or equal to 90% of their needs  MONITOR:  Diet advancement, Labs, Weight trends, I & O's, Skin (TPN)  REASON FOR ASSESSMENT:   Consult Enteral/tube feeding initiation and management  ASSESSMENT:   73 yo male admitted post several days of N/V followed by sudden onset of severe epigastric pain radiating to chest and back. Pt found to have small esophageal perforation above GE junction. PMH includes CAD, chronic HFpEF, HTN, HLD, DVT, CVA, PAF, CKD 3b.  10/05 Admitted 10/06 Esophagogram/barium study: distal esophageal perforation, TPN started 10/07 S/p Esophageal stent, TPN at goal rate of 80 ml/hr 10/08  CLD 10/10 Diet advanced to Dysphagia 3, Calorie Count 10/13 No meal tickets for calorie count, pt reports eating bites only 10/15 Esophageal stent migration with leak on esophagram with return to OR for stent repositioning, likely aspiration event requiring intubation 10/16 Extubated, Chest tube placed 10/17 Limited echo with EF 50-55%, RV mildly reduced 10/20 Intubated early AM, L pleural effusion, pigtail chest tube  inserted 10/23 - extubated; limited echo: 60-65% 10/24 - esophagram: persistent leak 10/28 - transfer out of ICU 11/03 - PEG tube placement  11/04 - TF started 11/5 - TF at 20 ml/hr 11/6 - TPN decreased to meet 50% of pt's needs, TF at 50 ml/hr,  11/7 - TF at 60 ml/hr, multiple loose BM, decrease rate of tube feeds to 45 ml/hr, continue TPN to meet 50% of needs 11/10 - increase TF to 15ml/hr; TPN meet 50% of needs 11/11 - increase TF to goal rate (modified to Vital 1.5 as [facility] out of stock); d/c TPN tomorrow   Tube feedings up to 31ml/hr yesterday afternoon when bag changed. Noted with 3 documented type 6/7 BMs yesterday. Increased Banatrol to QID at that time. Lomitil ordered PRN. Given at 11PM last night with good effect as no bowel movements noted between 12PM and 5AM this morning.    Discussed with RN. MD not amicable to scheduling Lomotil at this time, but continues to be ordered PRN up to 4x daily. Phenergan also ordered and given once in the last four days.   Received word from pharmacy that [facility] 1.4 Standard is out of stock. Will modify to Vital 1.5 for the time being and monitor tolerance. Goal rate for Vital 1.5 is 23ml/hr. Will recommend initiating at goal rate. When tolerance established, will add Prosource once daily, multivitamin, and free water  flushes.    Admit Weight: 72.8 kg Current Weight: 70.9 kg Lowest Weight: 66.8 kg on 11/06   Non-pitting, generalized edema noted. Bowels stabilizing.  Corrected calcium  remains elevated, but down trending. BUN/Crt stable, yet elevated. IV heparin  and amiodarone  transitioned to PO now that PEG placed and tube feedings started. Now off diuretics. Continues on Dilaudid  PCA. Trying to reduce. Some SS insulin  administrations being held d/t not meeting administration criteria.     Drains/Lines: R brachial: PICC, triple lumen LUQ: PEG (24Fr) placed 11/03 L pleural chest tube: 10 ml x24 hours UOP: 1100 ml x24 hours -  documentation appears incomplete   Labs from 11/10 reviewd: Sodium 145 (wdl) Potassium 4.5 (wdl) Corr Ca 10.5 (H) - down from 11.1 BUN 83 Creatinine 2.26 (H) Albumin  1.9 Magnesium  2.7 (H) Phosphorus 3.4(wdl) WBC 11.7(H) CBGs 153-164 x24 hours A1c 6.0 (01/2024)   Meds: Fluconazole Dilaudid  Fentanyl  patch SS Novolog  Pantoprazole  Florastor IV ABX  Diet Order:   Diet Order             Diet NPO time specified  Diet effective now                   EDUCATION NEEDS:   Education needs have been addressed  Skin:  Skin Assessment: Skin Integrity Issues: Skin Integrity Issues:: Stage I Stage I: buttocks, bilateral feet  Last BM:  11/10 - type 6/7 x3  Height:  Ht Readings from Last 1 Encounters:  02/07/24 6' 1 (1.854 m)   Weight:  Wt Readings from Last 1 Encounters:  02/27/24 70.9 kg    Ideal Body Weight:  83.6 kg  BMI:  Body mass index is 20.62 kg/m.  Estimated Nutritional Needs:   Kcal:  2300-2500 kcals  Protein:  110-130 g  Fluid:  2L  Blair Deaner MS, RD, LDN Registered Dietitian Clinical Nutrition RD Inpatient Contact Info in Amion

## 2024-02-27 NOTE — Progress Notes (Signed)
 Physical Therapy Treatment Patient Details Name: Billy [name] MRN: [id number] DOB: 05-09-1950 Today's Date: 02/27/2024   History of Present Illness Billy [name] is a 73 y.o. male who presented to [facility] ED 01/21/24 with several days of vomiting followed by an episode of severe tearing epigastric pain. Work-up revealed esophageal tear. Pt s/p esophageal stent with EGD 10/7. Return to OR for esophageal stent adjustment on 10/15, remained intubated after procedure due to suspected aspiration. Extubated 10/16.  Pt had CT placed 10/16. Reintubated 10/20-10/24. G tube placed 11/3. He accidentally pulled out the R chest tube on 11/7. MD plan for  PMHx: HFrEF, AFib, CAD, PE complicated by cardiogenic shock, CKD 4, MI, peripheral neuropathy, and TIA.    PT Comments  Pt received in supine just after premedication for pain by RN, pt agreeable to therapy session with encouragement. Pt needing up to CGA to perform bed mobility and transfers when using hospital bed features. Pt needing up to +2 minA and third person providing close chair follow to perform gait trial, needing seated break in chair 3/4 of the way through planned gait trial. This is an improvement from previous session, with pt reporting continued stiff neck sensation and difficulty maintaining attention to environment/gazing forward due to neck pain. Pt requesting return to supine at end of session due to neck pain. Patient will benefit from continued inpatient follow up therapy, <3 hours/day.    If plan is discharge home, recommend the following: Assistance with cooking/housework;Assist for transportation;Help with stairs or ramp for entrance;A lot of help with bathing/dressing/bathroom;Direct supervision/assist for medications management;Direct supervision/assist for financial management;Supervision due to cognitive status;Two people to help with walking and/or transfers   Can travel by private Theme Park Manager  (measurements PT);Wheelchair cushion (measurements PT);Rolling walker (2 wheels)    Recommendations for Other Services       Precautions / Restrictions Precautions Precautions: Fall Recall of Precautions/Restrictions: Impaired Precaution/Restrictions Comments: L chest tube, G-tube, PCA pump, check BP with postural changes if symptomatic. Restrictions Weight Bearing Restrictions Per Provider Order: No     Mobility  Bed Mobility Overal bed mobility: Needs Assistance Bed Mobility: Supine to Sit, Sit to Supine     Supine to sit: Contact guard, HOB elevated, Used rails Sit to supine: Contact guard assist, +2 for safety/equipment, HOB elevated, Used rails   General bed mobility comments: Increased time to perform and rest break before postural changes, but improved initiation this date. Assist for line mgmt, +2 at times for lines due to plethora of equipment/lines.    Transfers Overall transfer level: Needs assistance Equipment used: Rolling walker (2 wheels) Transfers: Sit to/from Stand Sit to Stand: Contact guard assist, +2 safety/equipment           General transfer comment: From EOB>RW and RW<>chair, then back to EOB from RW. Cues for safe UE placement prior to each transfer, +2 safety due to lines.    Ambulation/Gait Ambulation/Gait assistance: Min assist, +2 safety/equipment (+3 including chair follow) Gait Distance (Feet): 125 Feet (125, seated break, 67ft) Assistive device: Rolling walker (2 wheels) Gait Pattern/deviations: Step-through pattern, Decreased stride length, Drifts right/left Gait velocity: reduced     General Gait Details: Pt maintains forward head/rounded shoulders posture, only minimally improves forward gaze after dense cues for   Stairs             Wheelchair Mobility     Tilt Bed    Modified Rankin (Stroke Patients Only)  Balance Overall balance assessment: Needs assistance Sitting-balance support: No upper extremity  supported, Feet supported Sitting balance-Leahy Scale: Good     Standing balance support: Bilateral upper extremity supported, Reliant on assistive device for balance Standing balance-Leahy Scale: Poor Standing balance comment: appears reliant on RW for dynamic tasks; some external support PRN                            Communication Communication Communication: No apparent difficulties  Cognition Arousal: Alert Behavior During Therapy: WFL for tasks assessed/performed, Anxious   PT - Cognitive impairments: Attention, Problem solving, Safety/Judgement                       PT - Cognition Comments: Pt with slightly improved initiation and attention to task this date. pt still reports fear of falls and has difficulty with cues for environmental scanning/wayfinding in hallway, but reports he is unable to turn his neck to look to L/R sides of hallway is due to neck stiffness/pain. Following commands: Intact      Cueing Cueing Techniques: Verbal cues, Gestural cues  Exercises      General Comments General comments (skin integrity, edema, etc.): Pt denies sitting up in chair post-exertion, he requests return to supine; RN present in room to give him meds at end of session; BP MAP (90) and SBP ~160, taken on LUE after return to supine (improved from prior to OOB mobility when SBP was >180).      Pertinent Vitals/Pain Pain Assessment Pain Assessment: Faces Faces Pain Scale: Hurts little more (maybe a little higher post-ambulation while sitting EOB) Pain Location: chest tube site and neck Pain Descriptors / Indicators: Sore, Grimacing, Guarding, Discomfort Pain Intervention(s): Limited activity within patient's tolerance, Monitored during session, Premedicated before session, Repositioned    Home Living                          Prior Function            PT Goals (current goals can now be found in the care plan section) Acute Rehab PT Goals Patient  Stated Goal: To get stronger before I go home PT Goal Formulation: With patient Time For Goal Achievement: 03/04/24 Progress towards PT goals: Progressing toward goals    Frequency    Min 2X/week      PT Plan      Co-evaluation PT/OT/SLP Co-Evaluation/Treatment: Yes Reason for Co-Treatment: Complexity of the patient's impairments (multi-system involvement);For patient/therapist safety;To address functional/ADL transfers PT goals addressed during session: Mobility/safety with mobility;Balance;Proper use of DME        AM-PAC PT 6 Clicks Mobility   Outcome Measure  Help needed turning from your back to your side while in a flat bed without using bedrails?: A Little Help needed moving from lying on your back to sitting on the side of a flat bed without using bedrails?: A Little Help needed moving to and from a bed to a chair (including a wheelchair)?: A Little Help needed standing up from a chair using your arms (e.g., wheelchair or bedside chair)?: A Little Help needed to walk in hospital room?: A Lot Help needed climbing 3-5 steps with a railing? : Total 6 Click Score: 15    End of Session Equipment Utilized During Treatment: Gait belt (gait belt very high up under pt axilla for safety, above chest tube site) Activity Tolerance: Patient tolerated treatment well;Patient  limited by pain Patient left: in bed;with call bell/phone within reach;with bed alarm set;with nursing/sitter in room;Other (comment) (RN in room) Nurse Communication: Mobility status;Other (comment) (pt doffed Elliott prior to amb but sats OK on RA while he mobilizes) PT Visit Diagnosis: Difficulty in walking, not elsewhere classified (R26.2);Unsteadiness on feet (R26.81);Other abnormalities of gait and mobility (R26.89);Muscle weakness (generalized) (M62.81);Other symptoms and signs involving the nervous system (R29.898);Pain;History of falling (Z91.81) Pain - part of body:  (neck)     Time: 8697-8672 PT Time  Calculation (min) (ACUTE ONLY): 25 min  Charges:    $Gait Training: 8-22 mins PT General Charges $$ ACUTE PT VISIT: 1 Visit                     Aris Even P., PTA Acute Rehabilitation Services Secure Chat Preferred 9a-5:30pm Office: [phone]    Connell CHRISTELLA Blue 02/27/2024, 1:59 PM

## 2024-02-27 NOTE — Progress Notes (Signed)
 PHARMACY - TOTAL PARENTERAL NUTRITION CONSULT NOTE   Indication: intolerance to enteral feedings, esophageal perforation  Patient Measurements: Height: 6' 1 (185.4 cm) Weight: 70.9 kg (156 lb 4.9 oz) (1 pillow, 1 sheet.) IBW/kg (Calculated) : 79.9 TPN AdjBW (KG): 80.6 Body mass index is 20.62 kg/m.  Assessment:  73 year old man with medical history significant for cardiac arrest earlier this year who was admitted after several days of nausea and vomiting followed by sudden onset severe tearing epigastric pain radiating to back and chest. 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 Pharmacy consulted to initiate TPN due to inability to tolerate enteral nutrition at this time.  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 PTA takes torsemide  20 mg PO daily, now held. Aggressive fluid resuscitation recommended per GI. Per HF, patient currently euvolemic but will hold diuretic for now. Due to CHF will target lower end of fluid range for TPN and supplement additional fluids per MD outside of TPN to prevent fluid overload.   Stent placed on 10/6, esophogram not showing any leak. Diet advanced to CLD. 10/15 Leak present and made NPO. 10/24 esophagram with persistent leak; will need stent replacement in 3-4 weeks.   Glucose / Insulin : A1C 6.0% (02/2023);  CBG 107-153, 4 units SSI used in 24h No insulin  in TPN, removed on 11/6  Electrolytes: Na 145, K 4.5, Cl 109, CO2 22, Ca 8.9 [CoCa 10.5], phos 3.4, mg: 2.7 Renal: SCr improving 2.98 >> 2.65 (BL ~2), BUN down 105  Hepatic:  AST/ALT WNL /tbili 0.5 /alk phos 71, alb 1.9, TG 56 (11/3)  Intake / Output; MIVF: furosemide  > torsemide  stopped 11/6; UOP 0.65 mL/kg/hr, chest tube 10 ml, stool occurrence x3 on 11/10, bisacodyl  daily (held), fiber, florastor  Net IO Since Admission: 1,978.04 mL [02/27/24 0617]  GI Imaging: 10/05 CT: concerning for esophageal perforation, masslike area of soft tissue thickening  adjacent to and surrounding the esophagus  10/06 Esoph XR: distal esophageal perforation 10/7 Esoph XR: No leak associated with stent coverage of esophageal perforation 10/14 KUB: Gas-filled loops of small bowel and colon suggestive of ileus 10/15 Esoph XR: esophageal perforation/leak at the distal esophagus, proximal to the esophageal stent 10/24 esophagram: persistent leak despite being covered by the esophageal stent.  GI Surgeries / Procedures:  10/6: esophageal stent placement  10/15 EGD and stent repositioning   Central access: 01/22/24 TPN start date: 01/22/24  RD Assessment:  Estimated Needs Total Energy Estimated Needs: 2300-2500 kcals Total Protein Estimated Needs: 110-130 g Total Fluid Estimated Needs: 2L  Goal [place] 1.4 rate of 65 mL/hr  Current Nutrition:  TPN/NPO  10/13- 10/14 DYS1: not taking any po  10/15: NPO d/t increased abd distension  10/22 Propofol  at 7.6- 20.2 ml/hr, providing ~370 kcals/24hr- OFF 10/23: switch propofol  back to dexmedetomidine - OFF 11/4: Mallie Pinion started 20 ml/hr, goal 60 ml/hr 11/5 Mallie Pinion goal 65 ml/hr  11/6 Mallie Pinion at 40 ml/hr (1344 kcal) - tolerated per RN 11/7 Mallie Pinion at 60 ml/hr but decreasing to 45 ml/hr d/t diarrhea 11/8 Mallie Pinion 88mL/hr (1512 kcal and 67g protein)  11/10 Mallie Pinion 1.4 at 55 mL/hr (1848 kcal + 82 g protein)  Plan:  Continue TPN at 46mL/hr, providing 43g protein and 847kcal. When combined with Mallie Pinion @ 55 mL/hr (1848 kcal and 82 g protein), patient will be getting 2695 kcal and 125 g protein meeting 108% of nutritional needs.  Electrolytes in TPN: Na 10 mEq/L, K 30 mEq/L ,  Ca 0 mEq/L, Mg 0 mEq/L, Phos 6 mmol/L. Cl:Ac max acetate.  Continue standard MVI and trace elements to TPN Monitor TPN labs on Mon/Thurs.  F/u TF tolerability and ability to wean TPN once tube feeds at goal Closely monitor fluid status for need to re-concentrate TPN. Likely would only be able to concentrate another  additional 120cc/day.   Thank you for involving pharmacy in this patient's care.   Benedetta Heath BS, PharmD, BCPS Clinical Pharmacist 02/27/2024 6:04 AM  Contact: [phone] after 3 PM

## 2024-02-27 NOTE — Progress Notes (Signed)
 PT Cancellation Note  Patient Details Name: Billy [name] MRN: [id number] DOB: [birth year]   Cancelled Treatment:    Reason Eval/Treat Not Completed: (P) Other (comment) (pt needing extensive bed bath per NT, PTA defer until later in the day after pt is cleaned up.) Will continue efforts per PT plan of care after 1pm as schedule permits.   Connell HERO Joley Utecht 02/27/2024, 9:49 AM

## 2024-02-28 DIAGNOSIS — K223 Perforation of esophagus: Secondary | ICD-10-CM | POA: Diagnosis not present

## 2024-02-28 LAB — GLUCOSE, CAPILLARY
Glucose-Capillary: 131 mg/dL — ABNORMAL HIGH (ref 70–99)
Glucose-Capillary: 175 mg/dL — ABNORMAL HIGH (ref 70–99)
Glucose-Capillary: 149 mg/dL — ABNORMAL HIGH (ref 70–99)
Glucose-Capillary: 117 mg/dL — ABNORMAL HIGH (ref 70–99)
Glucose-Capillary: 142 mg/dL — ABNORMAL HIGH (ref 70–99)
Glucose-Capillary: 124 mg/dL — ABNORMAL HIGH (ref 70–99)

## 2024-02-28 MED ORDER — FREE WATER
100.0000 mL | Freq: Four times a day (QID) | Status: DC
  Administered 2024-02-29 – 2024-03-07 (×29): 100 mL

## 2024-02-28 MED ORDER — PROSOURCE TF20 ENFIT COMPATIBL EN LIQD
60.0000 mL | Freq: Every day | ENTERAL | Status: DC
  Administered 2024-02-28 – 2024-03-08 (×10): 60 mL
  Filled 2024-02-28 (×10): qty 60

## 2024-02-28 MED ORDER — ADULT MULTIVITAMIN W/MINERALS CH
1.0000 | ORAL_TABLET | Freq: Every day | ORAL | Status: DC
  Administered 2024-02-29 – 2024-03-02 (×3): 1
  Filled 2024-02-28 (×3): qty 1

## 2024-02-28 NOTE — Plan of Care (Signed)
  Problem: Education: Goal: Knowledge of General Education information will improve Description: Including pain rating scale, medication(s)/side effects and non-pharmacologic comfort measures Outcome: Progressing   Problem: Health Behavior/Discharge Planning: Goal: Ability to manage health-related needs will improve Outcome: Progressing   Problem: Clinical Measurements: Goal: Ability to maintain clinical measurements within normal limits will improve Outcome: Progressing Goal: Will remain free from infection Outcome: Progressing Goal: Diagnostic test results will improve Outcome: Progressing Goal: Respiratory complications will improve Outcome: Progressing Goal: Cardiovascular complication will be avoided Outcome: Progressing   Problem: Activity: Goal: Risk for activity intolerance will decrease Outcome: Progressing   Problem: Nutrition: Goal: Adequate nutrition will be maintained Outcome: Progressing   Problem: Coping: Goal: Level of anxiety will decrease Outcome: Progressing   Problem: Elimination: Goal: Will not experience complications related to bowel motility Outcome: Progressing Goal: Will not experience complications related to urinary retention Outcome: Progressing   Problem: Pain Managment: Goal: General experience of comfort will improve and/or be controlled Outcome: Progressing   Problem: Safety: Goal: Ability to remain free from injury will improve Outcome: Progressing   Problem: Skin Integrity: Goal: Risk for impaired skin integrity will decrease Outcome: Progressing   Problem: Education: Goal: Ability to describe self-care measures that may prevent or decrease complications (Diabetes Survival Skills Education) will improve Outcome: Progressing   Problem: Coping: Goal: Ability to adjust to condition or change in health will improve Outcome: Progressing   Problem: Fluid Volume: Goal: Ability to maintain a balanced intake and output will  improve Outcome: Progressing   Problem: Health Behavior/Discharge Planning: Goal: Ability to identify and utilize available resources and services will improve Outcome: Progressing Goal: Ability to manage health-related needs will improve Outcome: Progressing   Problem: Metabolic: Goal: Ability to maintain appropriate glucose levels will improve Outcome: Progressing   Problem: Nutritional: Goal: Maintenance of adequate nutrition will improve Outcome: Progressing Goal: Progress toward achieving an optimal weight will improve Outcome: Progressing   Problem: Skin Integrity: Goal: Risk for impaired skin integrity will decrease Outcome: Progressing   Problem: Tissue Perfusion: Goal: Adequacy of tissue perfusion will improve Outcome: Progressing   Problem: Activity: Goal: Ability to tolerate increased activity will improve Outcome: Progressing   Problem: Respiratory: Goal: Ability to maintain a clear airway and adequate ventilation will improve Outcome: Progressing   Problem: Role Relationship: Goal: Method of communication will improve Outcome: Progressing   Problem: Education: Goal: Ability to describe self-care measures that may prevent or decrease complications (Diabetes Survival Skills Education) will improve Outcome: Progressing Goal: Individualized Educational Video(s) Outcome: Progressing   Problem: Coping: Goal: Ability to adjust to condition or change in health will improve Outcome: Progressing   Problem: Fluid Volume: Goal: Ability to maintain a balanced intake and output will improve Outcome: Progressing   Problem: Health Behavior/Discharge Planning: Goal: Ability to identify and utilize available resources and services will improve Outcome: Progressing Goal: Ability to manage health-related needs will improve Outcome: Progressing   Problem: Metabolic: Goal: Ability to maintain appropriate glucose levels will improve Outcome: Progressing   Problem:  Nutritional: Goal: Maintenance of adequate nutrition will improve Outcome: Progressing Goal: Progress toward achieving an optimal weight will improve Outcome: Progressing   Problem: Skin Integrity: Goal: Risk for impaired skin integrity will decrease Outcome: Progressing   Problem: Tissue Perfusion: Goal: Adequacy of tissue perfusion will improve Outcome: Progressing

## 2024-02-28 NOTE — Progress Notes (Signed)
 PROGRESS NOTE  Billy [name] FMW:[id number] DOB: 28-Dec-1950   PCP: Seabron Lenis, MD  Patient is from: Home.  DOA: 01/21/2024 LOS: 38   Brief Narrative / Interim history: 73 year old M with PMH of cardiac arrest earlier this year along with HFrEF, Afib, PE presented with several days of nausea and vomiting from a stomach bug followed by sudden onset severe tearing epigastric pain radiating to back and chest. Workup revealed esophageal tear/Boerhaave. Patient had esophageal stent placed on 10/7.  Subsequent swallow study revealed an additional leak, and he had stent repositioned on 01/31/2024.  He became hypoxic with gastric contents noted on intubation, and he was transferred to ICU for mechanical ventilation.  Bedside ultrasound with right pleural effusion for which he had chest tube placed with removal of 300 cc exudative fluid.  He was also successfully extubated on 10/16.  Patient has had increased oxygen  requirement requiring reintubation on 10/20.  Bedside ultrasound showed left-sided pleural effusion and he had left-sided chest tube with 120 cc exudative output. ICU stay complicated by high O2 requirement, left sided pleural effusion, chest tube placement 10/16, reintubated 10/20.  Eventually, patient was extubated and transferred out of ICU on 10/28. Hospital course complicated by acute on chronic HFpEF for which he is on IV Lasix  per advanced heart failure team. PEG tube placed on 11/3.  Dietitian consulted for initiation of tube feed.  Remains on TPN until tube feed at goal.  Subjective:  Patient seen and examined at the bedside.  Appears upset.  Reports of ongoing nausea and loose stools.  Also complains of pain in the abdomen.  Vital signs are stable.  He is afebrile.  Assessment and plan:  Esophageal rupture/ Boerhaave syndrome Likely from excessive vomiting -S/p stent placement on 10/7 -S/p stent repositioning on 10/16 due to leakage -Esophagogram 10/24 shows persistent  leak -PEG tube placed on 11/3.  RD consulted for initiation of TF -Continue TPN until TF at goal Patient had bilateral chest tubes.  He accidentally pulled out the right chest tube on 11/7.  Left-sided chest tube removed 11/11.  Was found to have some erythema and purulent drainage from the right sided chest tube site.  He remains on Zosyn .  Duration of antibiotics per cardiothoracic.  Acute hypoxic/ hypercapnic respiratory failure- Multifactorial including aspiration pneumonia, pleural effusion and CHF.  Trach aspirate on 10/15 with Candida albicans. -IV meropenem>> IV Zosyn  for pneumonia per CTS. -Diflucan for Candida albicans from trach aspirate from 10/15-11/4. -Continue bronchodilators Respiratory status is stable   Acute on chronic diastolic CHF Limited TTE on 10/23 with LVEF of 60 to 65%.  RHC in 3/25 with mild PAH with severe RV failure but preserved CO.  Appears euvolemic on exam. Heart failure team has been following.  Patient was started on diuretics and Entresto  however these have been discontinued now due to elevation in creatinine. Patient now on hydralazine  with plans to initiate nitrates when he is able to take orally.   AKI on CKD-3B:  Baseline Cr~2.0.   Renal function worsened with diuretics and Entresto  both of which were held.  Now improving. Monitor urine output.  Avoid nephrotoxic agents.   Check labs periodically.   Paroxysmal A-fib:  s/p TEE DCCV 3/25.  Seems to be in sinus rhythm. Transitioned to amiodarone  and Eliquis  through tube.   Intractable pain Required ketamine and started on IV Dilaudid  PCA while in ICU. Patient on as needed oxycodone  as well.   Patient was started on fentanyl  patch by the nocturnist on 11/10.  Patient is off Dilaudid  PCA pump.  Currently ordered for Dilaudid  0.5 mg every 3 hours as needed.  H/o PE/ DVT-  S/p infrarenal IVC placement 3/25. V/Q 3/25 with multiple perfusion defects Now on Eliquis .  Essential hypertension:  Blood  pressure is reasonably well-controlled.  Patient is currently on hydralazine .  Nausea and diarrhea Patient with persistent nausea and diarrhea.  Medication changes were made.  He is now on Phenergan and Lomotil as needed.  Continue probiotics.  Will check labs in the a.m. and replace electrolytes as needed.  Unwitnessed fall in the hospital:  Found down on the floor the evening of 10/31.  No apparent injury or focal neurodeficit. Fall precaution  Anxiety disorder:  Stable. Ativan  as needed   Acute on chronic anemia Hemoglobin has been stable.  No evidence of overt blood loss   GERD - IV PPI.  Severe protein calorie malnutrition/hypoalbuminemia Body mass index is 20.97 kg/m. Nutrition Problem: Severe Malnutrition Etiology: acute illness (may have chronic component due to time frame of reported wt loss) Signs/Symptoms: severe fat depletion, moderate muscle depletion Interventions: Refer to RD note for recommendations Patient was started on TPN.  G-tube was subsequently placed.  Now on tube feedings.  TPN being weaned down gradually.  Pressure skin injury Wound 02/07/24 0900 Pressure Injury Foot Anterior;Right Stage 1 -  Intact skin with non-blanchable redness of a localized area usually over a bony prominence. (Active)     Wound 02/07/24 0900 Pressure Injury Foot Anterior;Left Stage 1 -  Intact skin with non-blanchable redness of a localized area usually over a bony prominence. (Active)     Wound 02/07/24 0900 Pressure Injury Buttocks Right Stage 1 -  Intact skin with non-blanchable redness of a localized area usually over a bony prominence. (Active)   DVT prophylaxis: On Eliquis  now Code Status: Full code Family Communication: None at bedside today. Disposition:  SNF when medically stable  Consultants:  Gastroenterology Critical care Cardiology Cardiothoracic surgery  Microbiology summarized: 10/5-MRSA PCR screen positive 10/15-trach aspirate culture with Candida  albicans.  Objective: Vitals:   02/27/24 1635 02/27/24 1954 02/27/24 2305 02/28/24 0306  BP: (!) 147/64 (!) 166/66 (!) 132/53 (!) 129/51  Pulse: 75 79 83 81  Resp: 19 20 20 20   Temp: 97.9 F (36.6 C) 98 F (36.7 C) 98 F (36.7 C) 97.8 F (36.6 C)  TempSrc: Oral Oral Oral Oral  SpO2: 95% 96% 97% 95%  Weight:    72.1 kg  Height:        Examination:  General appearance: Awake alert.  In no distress Resp: Clear to auscultation bilaterally.  Normal effort Cardio: S1-S2 is normal regular.  No S3-S4.  No rubs murmurs or bruit GI: Abdomen is soft.  Nontender nondistended.  Bowel sounds are present normal.  No masses organomegaly.  PEG tube is present Extremities: No edema.  Full range of motion of lower extremities. Neurologic: Alert and oriented x3.  No focal neurological deficits.     Sch Meds:  Scheduled Meds:  amiodarone   200 mg Per Tube BID   apixaban   5 mg Per Tube BID   Chlorhexidine  Gluconate Cloth  6 each Topical Daily   fentaNYL   1 patch Transdermal Q72H   fiber supplement (BANATROL TF)  60 mL Per Tube QID   fluconazole  200 mg Per Tube Daily   [name] butt cream   Topical BID   hydrALAZINE   100 mg Per Tube Q8H   insulin  aspart  0-9 Units Subcutaneous Q4H  lidocaine   1 patch Transdermal Q24H   losartan  25 mg Per Tube Daily   pantoprazole  (PROTONIX ) IV  40 mg Intravenous Q12H   saccharomyces boulardii  250 mg Per Tube BID   sodium chloride  flush  10-40 mL Intracatheter Q12H   Continuous Infusions:  feeding supplement (VITAL 1.5 CAL) 1,000 mL (02/28/24 0604)   piperacillin -tazobactam (ZOSYN )  IV 3.375 g (02/28/24 0555)   promethazine (PHENERGAN) injection (IM or IVPB) 12.5 mg (02/26/24 1153)   TPN ADULT (ION) 35 mL/hr at 02/28/24 0454   PRN Meds:.acetaminophen  (TYLENOL ) oral liquid 160 mg/5 mL, alum & mag hydroxide-simeth, diclofenac Sodium, diphenhydrAMINE **OR** diphenhydrAMINE, [COMPLETED] diphenoxylate-atropine **FOLLOWED BY** diphenoxylate-atropine,  HYDROmorphone  (DILAUDID ) injection, levalbuterol, lip balm, metoCLOPramide (REGLAN) injection, naloxone  **AND** sodium chloride  flush, mouth rinse, oxyCODONE , promethazine (PHENERGAN) injection (IM or IVPB), simethicone , sodium chloride  flush  Antimicrobials: Anti-infectives (From admission, onward)    Start     Dose/Rate Route Frequency Ordered Stop   02/22/24 1844  fluconazole (DIFLUCAN) tablet 200 mg        200 mg Per Tube Daily 02/22/24 1844 03/04/24 0959   02/22/24 1630  fluconazole (DIFLUCAN) tablet 200 mg  Status:  Discontinued        200 mg Oral Daily 02/22/24 1537 02/22/24 1844   02/13/24 0900  piperacillin -tazobactam (ZOSYN ) IVPB 3.375 g        3.375 g 12.5 mL/hr over 240 Minutes Intravenous Every 8 hours 02/13/24 0812     02/07/24 0800  vancomycin (VANCOCIN) IVPB 1000 mg/200 mL premix  Status:  Discontinued        1,000 mg 200 mL/hr over 60 Minutes Intravenous Every 24 hours 02/07/24 0632 02/09/24 0905   02/06/24 2200  meropenem (MERREM) 1 g in sodium chloride  0.9 % 100 mL IVPB  Status:  Discontinued        1 g 200 mL/hr over 30 Minutes Intravenous Every 12 hours 02/06/24 1127 02/13/24 0812   02/06/24 1300  vancomycin (VANCOREADY) IVPB 1750 mg/350 mL  Status:  Discontinued        1,750 mg 175 mL/hr over 120 Minutes Intravenous Every 48 hours 02/04/24 1225 02/04/24 1226   02/06/24 0800  vancomycin (VANCOCIN) IVPB 1000 mg/200 mL premix        1,000 mg 200 mL/hr over 60 Minutes Intravenous  Once 02/06/24 0629 02/07/24 0829   02/04/24 1400  meropenem (MERREM) 1 g in sodium chloride  0.9 % 100 mL IVPB  Status:  Discontinued        1 g 200 mL/hr over 30 Minutes Intravenous Every 8 hours 02/04/24 1208 02/06/24 1127   02/04/24 1300  linezolid  (ZYVOX ) IVPB 600 mg  Status:  Discontinued        600 mg 300 mL/hr over 60 Minutes Intravenous Every 12 hours 02/04/24 1202 02/04/24 1208   02/04/24 1300  vancomycin (VANCOREADY) IVPB 2000 mg/400 mL        2,000 mg 200 mL/hr over 120 Minutes  Intravenous  Once 02/04/24 1208 02/04/24 1623   02/04/24 1208  vancomycin variable dose per unstable renal function (pharmacist dosing)  Status:  Discontinued         Does not apply See admin instructions 02/04/24 1208 02/07/24 0632   01/31/24 1030  fluconazole (DIFLUCAN) IVPB 200 mg  Status:  Discontinued        200 mg 100 mL/hr over 60 Minutes Intravenous Daily 01/31/24 0938 02/20/24 1218   01/30/24 1445  piperacillin -tazobactam (ZOSYN ) IVPB 3.375 g  Status:  Discontinued  3.375 g 12.5 mL/hr over 240 Minutes Intravenous Every 8 hours 01/30/24 1354 02/04/24 1203   01/26/24 1000  amoxicillin-clavulanate (AUGMENTIN) 400-57 MG/5ML suspension 875 mg  Status:  Discontinued        875 mg Oral Every 12 hours 01/26/24 0658 01/30/24 1354   01/26/24 1000  fluconazole (DIFLUCAN) 40 MG/ML suspension 200 mg  Status:  Discontinued        200 mg Oral Daily 01/26/24 0658 01/31/24 0938   01/22/24 1445  fluconazole (DIFLUCAN) IVPB 400 mg  Status:  Discontinued        400 mg 100 mL/hr over 120 Minutes Intravenous Every 24 hours 01/22/24 1436 01/22/24 1439   01/22/24 1445  fluconazole (DIFLUCAN) IVPB 200 mg  Status:  Discontinued        200 mg 100 mL/hr over 60 Minutes Intravenous Every 24 hours 01/22/24 1439 01/26/24 0658   01/21/24 2200  piperacillin -tazobactam (ZOSYN ) IVPB 3.375 g  Status:  Discontinued       Placed in Followed by Linked Group   3.375 g 12.5 mL/hr over 240 Minutes Intravenous Every 8 hours 01/21/24 1607 01/26/24 0658   01/21/24 1615  piperacillin -tazobactam (ZOSYN ) IVPB 3.375 g       Placed in Followed by Linked Group   3.375 g 100 mL/hr over 30 Minutes Intravenous  Once 01/21/24 1607 01/21/24 1651        CBC: Recent Labs  Lab 02/22/24 0441 02/23/24 0403 02/24/24 0405 02/25/24 0500 02/26/24 0500  WBC 12.2* 11.1* 10.7* 11.1* 11.7*  HGB 12.7* 12.6* 12.0* 11.9* 12.3*  HCT 40.5 40.3 38.2* 38.0* 39.6  MCV 88.6 88.4 88.6 88.8 89.8  PLT 259 235 250 260 261   BMP  &GFR Recent Labs  Lab 02/22/24 0441 02/23/24 0403 02/24/24 0405 02/25/24 0500 02/26/24 0500 02/27/24 0521  NA 142 144 143 142 145  --   K 4.3 4.0 4.3 4.2 4.5  --   CL 103 101 102 105 109  --   CO2 26 24 26 24 22   --   GLUCOSE 114* 141* 145* 164* 153*  --   BUN 93* 106* 115* 105* 83*  --   CREATININE 2.51* 2.71* 2.98* 2.65* 2.26*  --   CALCIUM  9.2 9.0 8.9 8.9 8.9  --   MG 2.1 2.2 2.3 2.7* 2.8* 2.7*  PHOS 3.5 3.8 4.4 4.1 3.4  --    Estimated Creatinine Clearance: 30.6 mL/min (A) (by C-G formula based on SCr of 2.26 mg/dL (H)).  Liver & Pancreas: Recent Labs  Lab 02/22/24 0441 02/26/24 0500  AST 12* 15  ALT 15 18  ALKPHOS 70 71  BILITOT 0.5 0.5  PROT 6.8 6.3*  ALBUMIN  2.0* 1.9*    Recent Labs  Lab 02/27/24 1252 02/27/24 1637 02/27/24 1950 02/27/24 2303 02/28/24 0303  GLUCAP 139* 138* 123* 135* 131*     Microbiology: No results found for this or any previous visit (from the past 240 hours).   Radiology Studies: No results found.  Ajai Terhaar 02/28/2024  If 7PM-7AM, please contact night-coverage [email or web address] 02/28/2024, 7:45 AM

## 2024-02-28 NOTE — Progress Notes (Addendum)
 Nutrition Brief Note  Chest tube out. Patient up to goal rate tube feeding. Pharmacy to d/c TPN today, per discussion w/ Dr. Verdene yesterday. Met with patient at bedside. No new/significant concerns with TF. States his stomach is gurgling and some nausea. RN reports Phenergan ordered. This will be second administration in last four days. Continue to use Lomotil as needed for loose stools. Banatrol up to QID. Florastor BID continues.   INTERVENTION:   D/C TPN now that patient is up to goal rate  Continue TF via PEG: -Vital 1.5 to 60 ml/h (1,440 ml per day) -FWF q6h (400 ml per day)             - Continue Banatrol to QID d/t diarrhea-provides 5g soluble fiber per serving -Add Prosource TF20 60ml daily -Provides 2240 kcal, 117 gm protein, 1508 ml free water  daily   Add MVI w/ minerals  Continue Florastor BID for re-establishing gut microbiome  Blair Deaner MS, RD, LDN Registered Dietitian Clinical Nutrition RD Inpatient Contact Info in Amion

## 2024-02-29 DIAGNOSIS — K223 Perforation of esophagus: Secondary | ICD-10-CM | POA: Diagnosis not present

## 2024-02-29 LAB — BASIC METABOLIC PANEL WITH GFR
Anion gap: 9 (ref 5–15)
BUN: 57 mg/dL — ABNORMAL HIGH (ref 8–23)
CO2: 21 mmol/L — ABNORMAL LOW (ref 22–32)
Calcium: 8.7 mg/dL — ABNORMAL LOW (ref 8.9–10.3)
Chloride: 113 mmol/L — ABNORMAL HIGH (ref 98–111)
Creatinine, Ser: 1.97 mg/dL — ABNORMAL HIGH (ref 0.61–1.24)
GFR, Estimated: 36 mL/min — ABNORMAL LOW (ref >60)
Glucose, Bld: 109 mg/dL — ABNORMAL HIGH (ref 70–99)
Potassium: 4.4 mmol/L (ref 3.5–5.1)
Sodium: 143 mmol/L (ref 135–145)

## 2024-02-29 LAB — GLUCOSE, CAPILLARY
Glucose-Capillary: 106 mg/dL — ABNORMAL HIGH (ref 70–99)
Glucose-Capillary: 136 mg/dL — ABNORMAL HIGH (ref 70–99)
Glucose-Capillary: 127 mg/dL — ABNORMAL HIGH (ref 70–99)
Glucose-Capillary: 124 mg/dL — ABNORMAL HIGH (ref 70–99)
Glucose-Capillary: 117 mg/dL — ABNORMAL HIGH (ref 70–99)
Glucose-Capillary: 114 mg/dL — ABNORMAL HIGH (ref 70–99)

## 2024-02-29 NOTE — Discharge Instructions (Addendum)
 [employer] assistance programs Crisis assistance programs  -[facility]eless[facility] Arts Development Officer. If you are experiencing homelessness in [place], [place] , your first point of contact should be Pensions Consultant. You can reach Coordinated Entry by calling ([phone]) [phone] or by emailing [email or web address].  Community access points: [employer] [phone], [place]) every Tuesday from 9am-10am. [facility] ([address] [place] [address]. [address], [place]) every Wednesday from 8am-9am.   -[place] Coordinated Re-entry Daniel [place]cal[place]: Dial 211 and request. Offers referrals to homeless shelters in the area.    -The [employer] (202)423-3961) offers several services to local families, as funding allows. The Emergency Assistance Program (EAP), which they administer, provides household goods, free food, clothing, and financial aid to people in need in the [place]  area. The EAP program does have some qualification, and counselors will interview clients for financial assistance by written referral only. Referrals need to be made by the Department of Social Services or by other EAP approved human services agencies or charities in the area.  -[facility] of [employer], which can be reached at 212-468-2076, offers emergency assistance programs for those in need of help, such as food, rent assistance, a soup kitchen, shelter, and clothing. They are based in [facility]  but provide a number of services to those that qualify for assistance.   [facility] Department of Social Services may be able to offer temporary financial assistance and cash grants for paying rent and utilities, Help may be provided for local county residents who may be experiencing personal crisis when other resources, including government programs, are not available. Call [phone]  -High [employer] is a [employer] agency, The organization can offer emergency assistance for paying rent, caremark rx, utilities, food, household products and furniture. They offer extensive emergency and transitional housing for families, children and single women, and also run a Boy's and [employer]. Thrift Shops, Secondary School Teacher, and other aid offered too. [address], [place], [place]  [address], [phone]  -[place] Low Income Energy Assistance Program -- This is offered for [facility] families. The federal government created [employer] Program provides a one-time cash grant payment to help eligible low-income families pay their electric and heating bills. [address] [place], [place]  [address], [phone]  -[place] Emergency Assistance -- A program offers emergency utility and rent funds for greater [place] area residents. The program can also provide counseling and referrals to charities and government programs. Also provides food and a free meal program that serves lunch Mondays - Saturdays and dinner seven days per week to individuals in the community. [address] [place],   [address], [phone]  -[employer] - Offers affordable apartment and housing communities across      [place] and [place]. The low income and seniors can access public housing, rental assistance to qualified applicants, and apply for the section 8 rent subsidy program. Other programs include Chiropractor and Engineer, Maintenance. [address] [place], [place]  [address], dial [phone].  -[facility] provides transitional housing to veterans and the disabled. Clients will also access other services too, including assistance in applying for Disability, life skills classes, case management, and assistance in finding permanent housing. [address], [place], [place]  [place] [address], call [phone]  -[place] Transitional Housing through [facility] is for people who were just  evicted or that are formerly homeless. The non-profit will also help then gain self-sufficiency, find a home or apartment to live in, and also provides information on rent assistance when needed. Phone [phone]  -The [facility] helps low income, elderly, or disabled residents in seven counties in the [place] ([place], [place], [place], [place], [place], [place], [place], and [place]) save energy and reduce their utility bills by improving energy efficiency. Phone [phone].  -[employer] is located in the [place] [facility] in the [employer], [address], [place], [place] [address]. Parking is in the rear of the building. Phone: [phone]   General Email: info@gsohc .org  [facility] provides free housing counseling assistance in locating affordable rental housing or housing with support services for families and individuals in crisis and the chronically homeless. We provide potential resources for other housing needs like utilities. Our trained counselors also work with clients on budgeting and financial literacy in effort to empower them to take control of their financial situations. [employer] collaborates with homeless service providers and other stakeholders as part of the [employer] COC (Continuum of Care). The (COC) is a regional/local planning body that coordinates housing and services funding for homeless families and individuals. The role of [facility] in the COC is through housing counseling to work with people we serve on diversion strategies for those that are at imminent risk of becoming homeless. We also work with the Coordinated Assessment/Entry Specialist who attempts to find temporary solutions and/or connects the people  to Housing First, Rapid Re-housing or transitional housing programs. Our Homelessness Prevention Housing Counselors meet with clients on business days (Monday-Fridays, except scheduled holidays) from 8:30 am to 4:30 pm.  Legal assistance for evictions, foreclosure, and more -If you need free legal advice on civil issues, such as foreclosures, evictions, electronics engineer, government programs, domestic issues and more, Landscape Architect of [place]  [employer]) is a associate professor firm that provides free legal services and counsel to lower income people, seniors, disabled, and others, The goal is to ensure everyone has access to justice and fair representation. Call them at [phone].  [facility] for [facility] and Community Studies can provide info about obtaining legal assistance with evictions. Phone [phone].  Data Processing Manager  The [employer], [employer]. offers job and dispensing optician. Resources are focused on helping students obtain the skills and experiences that are necessary to compete in today's challenging and tight job market. The non-profit faith-based community action agency offers internship trainings as well as classroom instruction. Classes are tailored to meet the needs of people in the [facility] region. [place], [place] [address], [phone]  Foreclosure prevention/Debt Services Family Services of the [employer] Credit Counseling Service inludes debt and foreclosure prevention programs for local families. This includes money management, financial advice, budget review and development of a written action plan with a pensions consultant to help solve specific individual financial problems. In addition, housing and mortgage counselors can also provide pre- and post-purchase homeownership counseling, default resolution counseling (to prevent foreclosure) and reverse mortgage counseling. A Debt Management Program allows  people and families with a high level of credit card or medical debt to consolidate and repay consumer debt and loans to creditors and rebuild positive credit ratings and scores. Contact [phone] [phone].  Community clinics in [place] -Health Department [facility]: [address], [place], [address]. [phone].  -Health Department [place] [facility]: [phone], [facility], [address]. [phone].  -[facility] offers medical care through a group of doctors, pharmacies and other healthcare related agencies that offer services for low income, uninsured adults in [place]. Also offers adult Dental care and assistance with applying for an [employer]. Call [phone].   [facility]. This center provides low-cost health care to those without health insurance. Services offered include an onsite pharmacy. Phone [phone]. [address], [place].  -Medication Assistance Program serves as a link between pharmaceutical companies and patients to provide low cost or free prescription medications. This service is available for residents who meet certain income restrictions and have no insurance coverage. PLEASE CALL [phone] [place]) OR [phone] ([place])  -One Step Further: Materials Engineer, The [employer] Support & Nutrition Program, [place]. Call [phone]/ [phone].  Food pantry and assistance -Urban Ministry-Food Bank: [address], [place] [address]. Phone [phone]  -[facility] Food Pantry: [address], [place], [place] [address]. [phone].  -Missionary Ministry: has the purpose of visiting the sick and shut-ins and provide for needs in the surrounding communities. Call [phone]. Email: [email or web address] This program provides: Food box for seniors, Financial assistance, Food to meet basic  nutritional needs.  -Meals on Wheels with Senior Resources: [facility] residents age 10 and over who are homebound and unable to obtain and prepare a nutritious meal for themselves are eligible for this service. There may be a waiting list in certain parts of [facility] if the route in that area is full. If you are in [facility] and [place] call [phone] to register. For all other areas call [phone] to register.  -Greater Dietitian: https://findfood.bargaincontractor.si  TRANSPORTATION: -[employer] Department of Health: Call [facility] [address] and [employer] at [phone] for details. attractionguides.es  -Access [place]: Access [place] is the [employer] Agency's shared-ride transportation service for eligible riders who have a disability that prevents them from riding the fixed route bus. Call [phone]. Access [place] riders must pay a fare of $1.50 per trip, or may purchase a 10-ride punch card for $14.00 ($1.40 per ride) or a 40-ride punch card for $48.00 ($1.20 per ride).  -The Directv transportation service is provided for senior citizens (60+) who live independently within [place] city limits and are unable to drive or have limited access to transportation. Call [phone] to schedule an appointment.  -[place] Transportation: For Medicare or Medicaid recipients call [phone]?[facility] Ambulance, wheelchair fleeta, and ambulatory quotes available.   FLEEING VIOLENCE: -Family Services of the [place]- 24/7 Crisis line [phone]) -[facility] Justice Centers: ([phone] ([phone])  [place] 2-1-1 is another useful way to locate resources in the community. Visit shedsizes.ch to find service information online. If you need additional assistance, 2-1-1 Referral Specialists are available 24 hours a day, every day by dialing  2-1-1 or [phone] from any phone. The call is free, confidential, and available in any language.  Affordable Housing Search http://www.nchousingsearch.[facility])   M-F 8a-3p [address]  [place], [place] [address] [phone] Services include: laundry, barbering, support groups, case management, phone & computer access, showers, AA/NA mtgs, mental health/substance abuse nurse, job skills class, disability information, VA assistance, spiritual classes, etc. Winter Shelter available when temperatures are less than 32 degrees.   HOMELESS SHELTERS [facility] at [facility]- Call [phone] ext. [phone]  or ext. 336. Located at [address], [place], [place] [address]  [facility] Mens Shelter- Call [phone]. Located at [address], [place] [address].  [facility]- [employer]. Call [phone]. Office located at [address] [place] [address].  [facility] Family Housing through [place] [phone].  [facility] Family Shelter- Call [phone]. Located at [address] [place], [place], [place] [address].  Room at the Inn-For Pregnant mothers. Call [phone]. Located at [address] [place], [address].  [place] [facility]-For men in [place]. Call [phone]. [facility]ter in [place]. Call [phone].  Home of [employer] for [employer] [phone]. Office located at [address], [place], [address].  [employer] be agreeable to help with chores. Call [phone] ext. [phone].  Men's: [address] [place], [place] [address]. Women's: [facility]  [address] [place], [place] [address]  Crisis Services [facility] Mobile Crisis Management- [phone]  [facility] [address], [place], [place] [address]. Phone: [phone]  Social Connections    -PACE (Adult Program)  - Address: [address] [place], [place] [address] - General office #:  [phone] - Enrollment Phone #: [phone]  -Institute of Aging  - Senior Friendship Line: call toll free, available 24 hours a day, at [phone]   -[place] [address]  [place] 2-1-1 is another useful way to locate resources in the community. Visit shedsizes.ch to find service information online. If you need additional assistance, 2-1-1 Referral Specialists are available 24 hours a day, every day by dialing 2-1-1 or [phone] from any phone. The call is free, confidential, and available in any language.  -Senior Resources of [place]: [phone] / [address] [place], [place] [address]  -Dial 988: Talk lifeline 24/7.  -[place]  - Promise Resource Network Warmline: [phone]  Information on my medicine - ELIQUIS  (apixaban )  This medication education was reviewed with me or my healthcare representative as part of my discharge preparation.    Why was Eliquis  prescribed for you? Eliquis  was prescribed for you to reduce the risk of a blood clot forming that can cause a stroke if you have a medical condition called atrial fibrillation (a type of irregular heartbeat).  What do You need to know about Eliquis  ? Take your Eliquis  TWICE DAILY - one tablet in the morning and one tablet in the evening with or without food. If you have difficulty swallowing the tablet whole please discuss with your pharmacist how to take the medication safely.  Take Eliquis  exactly as prescribed by your doctor and DO NOT stop taking Eliquis  without talking to the doctor who prescribed the medication.  Stopping may increase your risk of developing a stroke.  Refill your prescription before you run out.  After discharge, you should have regular check-up appointments with your healthcare provider that is prescribing your Eliquis .  In the future your dose may need to be changed if your  kidney function or weight changes by a significant amount or as you get older.  What do you do if you miss a dose? If you miss a dose, take it as soon as you remember on the same day and resume taking twice daily.  Do not take more than one dose of ELIQUIS  at the same time to make up a missed dose.  Important Safety Information A possible side effect of Eliquis  is bleeding. You should call your  healthcare provider right away if you experience any of the following: Bleeding from an injury or your nose that does not stop. Unusual colored urine (red or dark brown) or unusual colored stools (red or black). Unusual bruising for unknown reasons. A serious fall or if you hit your head (even if there is no bleeding).  Some medicines may interact with Eliquis  and might increase your risk of bleeding or clotting while on Eliquis . To help avoid this, consult your healthcare provider or pharmacist prior to using any new prescription or non-prescription medications, including herbals, vitamins, non-steroidal anti-inflammatory drugs (NSAIDs) and supplements.  This website has more information on Eliquis  (apixaban ): [email or web address]

## 2024-02-29 NOTE — Progress Notes (Signed)
 Physical Therapy Treatment Patient Details Name: Billy [name] MRN: [id number] DOB: [birth year] Today's Date: 02/29/2024   History of Present Illness Billy [name] is a 73 y.o. male who presented to [facility] ED 01/21/24 with several days of vomiting followed by an episode of severe tearing epigastric pain. Work-up revealed esophageal tear. Pt s/p esophageal stent with EGD 10/7. Return to OR for esophageal stent adjustment on 10/15, remained intubated after procedure due to suspected aspiration. Extubated 10/16.  Pt had CT placed 10/16. Reintubated 10/20-10/24. G tube placed 11/3. He accidentally pulled out the R chest tube on 11/7. L chest tube removed 11/11. MD plan for  PMHx: HFrEF, AFib, CAD, PE complicated by cardiogenic shock, CKD 4, MI, peripheral neuropathy, and TIA.    PT Comments  Pt received in supine, agreeable to gait and stair trial today. Pt able to perform 6 step in room 10 reps with minA +2 safety. Pt attempted gait trial with rollator today but safer with 2 wheeled walker, he needed seated rest breaks after short household distances. VSS on RA. Pt did much better today with ambulation and would benefit from continued PT to increase endurance and strength, he is making good progress toward PT goals. Patient will benefit from continued inpatient follow up therapy, <3 hours/day.    If plan is discharge home, recommend the following: Assistance with cooking/housework;Assist for transportation;Help with stairs or ramp for entrance;A lot of help with bathing/dressing/bathroom;Direct supervision/assist for medications management;Direct supervision/assist for financial management;Supervision due to cognitive status;A lot of help with walking and/or transfers   Can travel by private vehicle        Equipment Recommendations  Wheelchair (measurements PT);Wheelchair cushion (measurements PT);Rolling walker (2 wheels)    Recommendations for Other Services       Precautions / Restrictions  Precautions Precautions: Fall Recall of Precautions/Restrictions: Impaired Precaution/Restrictions Comments: G-tube Restrictions Weight Bearing Restrictions Per Provider Order: No     Mobility  Bed Mobility Overal bed mobility: Needs Assistance Bed Mobility: Supine to Sit, Sit to Supine Rolling: Contact guard assist   Supine to sit: Min assist, HOB elevated, Used rails, +2 for safety/equipment Sit to supine: Min assist, HOB elevated, Used rails, +2 for safety/equipment   General bed mobility comments: Pt required increased time to get EOB. Pt needs handheld asssit to sit up stating it hurt to use the rail on the bed. +2 for line mgmt.    Transfers Overall transfer level: Needs assistance Equipment used: Rollator (4 wheels), Rolling walker (2 wheels) Transfers: Sit to/from Stand Sit to Stand: Min assist, +2 safety/equipment           General transfer comment: Pt stood from EOB to rollator for the first time. Requires multiple cues for rollator safety and use of brakes. Pt required increased time to stand and repetition of verbal cues to push off the bed/chair and not pull on rollator to stand.    Ambulation/Gait Ambulation/Gait assistance: Min assist, +2 safety/equipment Gait Distance (Feet): 60 Feet (x2 bouts with seated rest break between.) Assistive device: Rolling walker (2 wheels) Gait Pattern/deviations: Trunk flexed, Drifts right/left, Step-through pattern, Decreased stride length Gait velocity: reduced     General Gait Details: Pt ambulated in hall with RW after ambulating in room with rollator. Deferred further use due to safety concerns. Pt required verbal cues to stay closer within walker and slow down for safety and line mgmt, with frequent cues for activity pacing/energy conservation and environmental scanning/awareness. Pt took a seated rest break in hall for 2-3  minutes before returning to room. Pt needed cues for wayfinding in hallway, had difficulty finding  his room number despite cues and hints. Pt with difficulty maintaining cervical extension but slightly improved this session with frequent cues.   Stairs Stairs: Yes (6 inch platform step in room) Stairs assistance: Min assist, +2 safety/equipment Stair Management: Two rails, Forwards, Backwards, Step to pattern Number of Stairs: 10 General stair comments: Pt performed stairs with min A, required verbal cues for sequencing and multiple verbal cues to take a standing break for O2 monitor to get a better signal and due to increased work of breathing with exertion.   Wheelchair Mobility     Tilt Bed    Modified Rankin (Stroke Patients Only)       Balance Overall balance assessment: Needs assistance Sitting-balance support: Feet supported, No upper extremity supported Sitting balance-Leahy Scale: Good Sitting balance - Comments: sitting EOB without back support   Standing balance support: Reliant on assistive device for balance, During functional activity, Bilateral upper extremity supported Standing balance-Leahy Scale: Poor Standing balance comment: Dependent on RW                            Communication Communication Communication: Impaired Factors Affecting Communication: Difficulty expressing self  Cognition Arousal: Alert Behavior During Therapy: Anxious, Flat affect   PT - Cognitive impairments: Safety/Judgement, Problem solving, Sequencing, Initiation, Awareness, Attention                       PT - Cognition Comments: Still needs lots of cues for awareness of environment and self check-ins for activity tolerance with increased activity. Following commands: Impaired Following commands impaired: Follows one step commands with increased time, Only follows one step commands consistently    Cueing Cueing Techniques: Verbal cues, Gestural cues, Tactile cues, Visual cues  Exercises Other Exercises Other Exercises: cues for cervical flexion/extension  and cervical rotation to L/R. Pt with poor tolerance for rotation but able to perform flex/ext ~10 reps without severe c/o pain.    General Comments General comments (skin integrity, edema, etc.): Pt did much better today with gait and a stair trial. VSS on RA throughout. Pt defers to sit in chair post-exertion due to c/o neck pain while seated upright.      Pertinent Vitals/Pain Pain Assessment Pain Assessment: Faces Faces Pain Scale: Hurts a little bit Breathing: occasional labored breathing, short period of hyperventilation Negative Vocalization: occasional moan/groan, low speech, negative/disapproving quality Facial Expression: facial grimacing Body Language: tense, distressed pacing, fidgeting Consolability: distracted or reassured by voice/touch PAINAD Score: 6 Pain Location: cervical pain appears most prominent, pt also c/o generalized back pain. Does not tolerate sitting upright long due to neck pain Pain Descriptors / Indicators: Discomfort, Guarding, Grimacing Pain Intervention(s): Limited activity within patient's tolerance, Monitored during session, Premedicated before session, Repositioned    Home Living                          Prior Function            PT Goals (current goals can now be found in the care plan section) Acute Rehab PT Goals Patient Stated Goal: To get stronger before I go home PT Goal Formulation: With patient Time For Goal Achievement: 03/04/24 Progress towards PT goals: Progressing toward goals    Frequency    Min 2X/week      PT Plan  Co-evaluation              AM-PAC PT 6 Clicks Mobility   Outcome Measure  Help needed turning from your back to your side while in a flat bed without using bedrails?: A Little Help needed moving from lying on your back to sitting on the side of a flat bed without using bedrails?: A Lot Help needed moving to and from a bed to a chair (including a wheelchair)?: A Little Help needed  standing up from a chair using your arms (e.g., wheelchair or bedside chair)?: A Little Help needed to walk in hospital room?: A Lot Help needed climbing 3-5 steps with a railing? : A Lot 6 Click Score: 15    End of Session Equipment Utilized During Treatment: Gait belt Activity Tolerance: Patient tolerated treatment well;Patient limited by fatigue;Patient limited by pain (c/o cervical pain) Patient left: in bed;with call bell/phone within reach;with bed alarm set;with SCD's reapplied Nurse Communication: Mobility status PT Visit Diagnosis: Difficulty in walking, not elsewhere classified (R26.2);Unsteadiness on feet (R26.81);Other abnormalities of gait and mobility (R26.89);Muscle weakness (generalized) (M62.81);Other symptoms and signs involving the nervous system (R29.898);Pain;History of falling (Z91.81) Pain - part of body:  (Back, neck)     Time: 8969-8885 PT Time Calculation (min) (ACUTE ONLY): 44 min  Charges:    $Gait Training: 23-37 mins $Therapeutic Activity: 8-22 mins PT General Charges $$ ACUTE PT VISIT: 1 Visit                    Johnnie Gaynelle JACQUE Johnnie Marlow Hendrie 02/29/2024, 12:31 PM

## 2024-02-29 NOTE — Progress Notes (Signed)
 PROGRESS NOTE  Billy [name] FMW:[id number] DOB: 12-12-1950   PCP: Seabron Lenis, MD  Patient is from: Home.  DOA: 01/21/2024 LOS: 39   Brief Narrative / Interim history: 73 year old M with PMH of cardiac arrest earlier this year along with HFrEF, Afib, PE presented with several days of nausea and vomiting from a stomach bug followed by sudden onset severe tearing epigastric pain radiating to back and chest. Workup revealed esophageal tear/Boerhaave. Patient had esophageal stent placed on 10/7.  Subsequent swallow study revealed an additional leak, and he had stent repositioned on 01/31/2024.  He became hypoxic with gastric contents noted on intubation, and he was transferred to ICU for mechanical ventilation.  Bedside ultrasound with right pleural effusion for which he had chest tube placed with removal of 300 cc exudative fluid.  He was also successfully extubated on 10/16.  Patient has had increased oxygen  requirement requiring reintubation on 10/20.  Bedside ultrasound showed left-sided pleural effusion and he had left-sided chest tube with 120 cc exudative output. ICU stay complicated by high O2 requirement, left sided pleural effusion, chest tube placement 10/16, reintubated 10/20.  Eventually, patient was extubated and transferred out of ICU on 10/28. Hospital course complicated by acute on chronic HFpEF for which he is on IV Lasix  per advanced heart failure team. PEG tube placed on 11/3.  Dietitian consulted for initiation of tube feed.  Remains on TPN until tube feed at goal.  Subjective:  Patient seen and examined at the bedside.  Vital signs are stable.  He has been tolerating tube feeds okay.  Not was nauseous like yesterday.  Has loose stool but not very frequent.  Vital signs are stable.  Assessment and plan:  Esophageal rupture/ Boerhaave syndrome Likely from excessive vomiting -S/p stent placement on 10/7 -S/p stent repositioning on 10/16 due to leakage -Esophagogram 10/24  shows persistent leak -PEG tube placed on 11/3.  Currently tolerating tube feed okay, moving towards goal -Continue TPN until TF at goal Both chest tubes are out, currently on IV Zosyn , duration of antibiotics per cardiothoracic.  Acute hypoxic/ hypercapnic respiratory failure- Multifactorial including aspiration pneumonia, pleural effusion and CHF.  Trach aspirate on 10/15 with Candida albicans. -IV meropenem>> IV Zosyn  for pneumonia per CTS. -Diflucan for Candida albicans from trach aspirate from 10/15-11/4. -Continue bronchodilators Respiratory status is stable   Acute on chronic diastolic CHF Limited TTE on 10/23 with LVEF of 60 to 65%.  RHC in 3/25 with mild PAH with severe RV failure but preserved CO.  Appears euvolemic on exam. Heart failure team has been following.  Patient was started on diuretics and Entresto  however these have been discontinued now due to elevation in creatinine. Patient now on hydralazine  with plans to initiate nitrates when he is able to take orally.   AKI on CKD-3B:  Baseline Cr~2.0.   Renal function worsened with diuretics and Entresto  both of which were held.  Now improving. Monitor urine output.  Avoid nephrotoxic agents.   Check labs periodically.   Paroxysmal A-fib:  s/p TEE DCCV 3/25.  Seems to be in sinus rhythm. Transitioned to amiodarone  and Eliquis  through tube.   Intractable pain Required ketamine and started on IV Dilaudid  PCA while in ICU. Off Dilaudid  pump 11/12, currently on p.o. oxy as well as IV Dilaudid  pushes as needed Placed fentanyl  patch 11/10 night, will discontinue.  H/o PE/ DVT-  S/p infrarenal IVC placement 3/25. V/Q 3/25 with multiple perfusion defects Now on Eliquis .  Essential hypertension:  Blood pressure is reasonably  well-controlled.  Patient is currently on hydralazine .  Nausea and diarrhea Patient with persistent nausea and diarrhea.  Medication changes were made.  He is now on Phenergan and Lomotil as needed.   Continue probiotics.  Will check labs in the a.m. and replace electrolytes as needed.  Unwitnessed fall in the hospital:  Found down on the floor the evening of 10/31.  No apparent injury or focal neurodeficit. Fall precaution  Anxiety disorder:  Stable. Ativan  as needed   Acute on chronic anemia Hemoglobin has been stable.  No evidence of overt blood loss   GERD - IV PPI.  Physical deconditioning: PT/OT  Severe protein calorie malnutrition/hypoalbuminemia Body mass index is 20.91 kg/m. Nutrition Problem: Severe Malnutrition Etiology: acute illness (may have chronic component due to time frame of reported wt loss) Signs/Symptoms: severe fat depletion, moderate muscle depletion Interventions: Refer to RD note for recommendations Patient was started on TPN.  G-tube was subsequently placed.  Now on tube feedings.  TPN being weaned down gradually.  Pressure skin injury Wound 02/07/24 0900 Pressure Injury Foot Anterior;Right Stage 1 -  Intact skin with non-blanchable redness of a localized area usually over a bony prominence. (Active)     Wound 02/07/24 0900 Pressure Injury Foot Anterior;Left Stage 1 -  Intact skin with non-blanchable redness of a localized area usually over a bony prominence. (Active)     Wound 02/07/24 0900 Pressure Injury Buttocks Right Stage 1 -  Intact skin with non-blanchable redness of a localized area usually over a bony prominence. (Active)   DVT prophylaxis: On Eliquis  now Code Status: Full code Family Communication: None at bedside today. Disposition:  SNF when medically stable  Consultants:  Gastroenterology Critical care Cardiology Cardiothoracic surgery  Microbiology summarized: 10/5-MRSA PCR screen positive 10/15-trach aspirate culture with Candida albicans.  Objective: Vitals:   02/28/24 2327 02/29/24 0406 02/29/24 0808 02/29/24 1211  BP: (!) 145/54 (!) 155/64 (!) 146/59 137/64  Pulse: 78 75 74 67  Resp: 18 20 17 14   Temp: 98.1 F  (36.7 C) 98.2 F (36.8 C) 98.6 F (37 C) 98 F (36.7 C)  TempSrc: Oral Oral Oral Oral  SpO2: 95% 96% 98% 90%  Weight:  71.9 kg    Height:        Examination:  General appearance: Awake alert.  In no distress Resp: Clear to auscultation bilaterally.  Normal effort Cardio: S1-S2 is normal regular.  No S3-S4.  No rubs murmurs or bruit GI: Abdomen is soft.  Nontender nondistended.  Bowel sounds are present normal.  No masses organomegaly.  PEG tube is present Extremities: + edema.  Full range of motion of lower extremities. Neurologic: Alert and oriented x3.  No focal neurological deficits.     Sch Meds:  Scheduled Meds:  amiodarone   200 mg Per Tube BID   apixaban   5 mg Per Tube BID   Chlorhexidine  Gluconate Cloth  6 each Topical Daily   feeding supplement (PROSource TF20)  60 mL Per Tube Daily   fentaNYL   1 patch Transdermal Q72H   fiber supplement (BANATROL TF)  60 mL Per Tube QID   fluconazole  200 mg Per Tube Daily   free water   100 mL Per Tube Q6H   [name] butt cream   Topical BID   hydrALAZINE   100 mg Per Tube Q8H   insulin  aspart  0-9 Units Subcutaneous Q4H   lidocaine   1 patch Transdermal Q24H   losartan  25 mg Per Tube Daily   multivitamin with minerals  1 tablet Per Tube Daily   pantoprazole  (PROTONIX ) IV  40 mg Intravenous Q12H   saccharomyces boulardii  250 mg Per Tube BID   sodium chloride  flush  10-40 mL Intracatheter Q12H   Continuous Infusions:  feeding supplement (VITAL 1.5 CAL) 60 mL/hr at 02/29/24 1239   piperacillin -tazobactam (ZOSYN )  IV 3.375 g (02/29/24 1304)   promethazine (PHENERGAN) injection (IM or IVPB) 12.5 mg (02/26/24 1153)   PRN Meds:.acetaminophen  (TYLENOL ) oral liquid 160 mg/5 mL, alum & mag hydroxide-simeth, diclofenac Sodium, diphenhydrAMINE **OR** diphenhydrAMINE, [COMPLETED] diphenoxylate-atropine **FOLLOWED BY** diphenoxylate-atropine, HYDROmorphone  (DILAUDID ) injection, levalbuterol, lip balm, metoCLOPramide (REGLAN) injection,  naloxone  **AND** sodium chloride  flush, mouth rinse, oxyCODONE , promethazine (PHENERGAN) injection (IM or IVPB), simethicone , sodium chloride  flush  Antimicrobials: Anti-infectives (From admission, onward)    Start     Dose/Rate Route Frequency Ordered Stop   02/22/24 1844  fluconazole (DIFLUCAN) tablet 200 mg        200 mg Per Tube Daily 02/22/24 1844 03/04/24 0959   02/22/24 1630  fluconazole (DIFLUCAN) tablet 200 mg  Status:  Discontinued        200 mg Oral Daily 02/22/24 1537 02/22/24 1844   02/13/24 0900  piperacillin -tazobactam (ZOSYN ) IVPB 3.375 g        3.375 g 12.5 mL/hr over 240 Minutes Intravenous Every 8 hours 02/13/24 0812     02/07/24 0800  vancomycin (VANCOCIN) IVPB 1000 mg/200 mL premix  Status:  Discontinued        1,000 mg 200 mL/hr over 60 Minutes Intravenous Every 24 hours 02/07/24 0632 02/09/24 0905   02/06/24 2200  meropenem (MERREM) 1 g in sodium chloride  0.9 % 100 mL IVPB  Status:  Discontinued        1 g 200 mL/hr over 30 Minutes Intravenous Every 12 hours 02/06/24 1127 02/13/24 0812   02/06/24 1300  vancomycin (VANCOREADY) IVPB 1750 mg/350 mL  Status:  Discontinued        1,750 mg 175 mL/hr over 120 Minutes Intravenous Every 48 hours 02/04/24 1225 02/04/24 1226   02/06/24 0800  vancomycin (VANCOCIN) IVPB 1000 mg/200 mL premix        1,000 mg 200 mL/hr over 60 Minutes Intravenous  Once 02/06/24 0629 02/07/24 0829   02/04/24 1400  meropenem (MERREM) 1 g in sodium chloride  0.9 % 100 mL IVPB  Status:  Discontinued        1 g 200 mL/hr over 30 Minutes Intravenous Every 8 hours 02/04/24 1208 02/06/24 1127   02/04/24 1300  linezolid  (ZYVOX ) IVPB 600 mg  Status:  Discontinued        600 mg 300 mL/hr over 60 Minutes Intravenous Every 12 hours 02/04/24 1202 02/04/24 1208   02/04/24 1300  vancomycin (VANCOREADY) IVPB 2000 mg/400 mL        2,000 mg 200 mL/hr over 120 Minutes Intravenous  Once 02/04/24 1208 02/04/24 1623   02/04/24 1208  vancomycin variable dose per  unstable renal function (pharmacist dosing)  Status:  Discontinued         Does not apply See admin instructions 02/04/24 1208 02/07/24 0632   01/31/24 1030  fluconazole (DIFLUCAN) IVPB 200 mg  Status:  Discontinued        200 mg 100 mL/hr over 60 Minutes Intravenous Daily 01/31/24 0938 02/20/24 1218   01/30/24 1445  piperacillin -tazobactam (ZOSYN ) IVPB 3.375 g  Status:  Discontinued        3.375 g 12.5 mL/hr over 240 Minutes Intravenous Every 8 hours 01/30/24 1354 02/04/24 1203  01/26/24 1000  amoxicillin-clavulanate (AUGMENTIN) 400-57 MG/5ML suspension 875 mg  Status:  Discontinued        875 mg Oral Every 12 hours 01/26/24 0658 01/30/24 1354   01/26/24 1000  fluconazole (DIFLUCAN) 40 MG/ML suspension 200 mg  Status:  Discontinued        200 mg Oral Daily 01/26/24 0658 01/31/24 0938   01/22/24 1445  fluconazole (DIFLUCAN) IVPB 400 mg  Status:  Discontinued        400 mg 100 mL/hr over 120 Minutes Intravenous Every 24 hours 01/22/24 1436 01/22/24 1439   01/22/24 1445  fluconazole (DIFLUCAN) IVPB 200 mg  Status:  Discontinued        200 mg 100 mL/hr over 60 Minutes Intravenous Every 24 hours 01/22/24 1439 01/26/24 0658   01/21/24 2200  piperacillin -tazobactam (ZOSYN ) IVPB 3.375 g  Status:  Discontinued       Placed in Followed by Linked Group   3.375 g 12.5 mL/hr over 240 Minutes Intravenous Every 8 hours 01/21/24 1607 01/26/24 0658   01/21/24 1615  piperacillin -tazobactam (ZOSYN ) IVPB 3.375 g       Placed in Followed by Linked Group   3.375 g 100 mL/hr over 30 Minutes Intravenous  Once 01/21/24 1607 01/21/24 1651        CBC: Recent Labs  Lab 02/23/24 0403 02/24/24 0405 02/25/24 0500 02/26/24 0500  WBC 11.1* 10.7* 11.1* 11.7*  HGB 12.6* 12.0* 11.9* 12.3*  HCT 40.3 38.2* 38.0* 39.6  MCV 88.4 88.6 88.8 89.8  PLT 235 250 260 261   BMP &GFR Recent Labs  Lab 02/23/24 0403 02/24/24 0405 02/25/24 0500 02/26/24 0500 02/27/24 0521 02/29/24 0500  NA 144 143 142 145  --   143  K 4.0 4.3 4.2 4.5  --  4.4  CL 101 102 105 109  --  113*  CO2 24 26 24 22   --  21*  GLUCOSE 141* 145* 164* 153*  --  109*  BUN 106* 115* 105* 83*  --  57*  CREATININE 2.71* 2.98* 2.65* 2.26*  --  1.97*  CALCIUM  9.0 8.9 8.9 8.9  --  8.7*  MG 2.2 2.3 2.7* 2.8* 2.7*  --   PHOS 3.8 4.4 4.1 3.4  --   --    Estimated Creatinine Clearance: 35 mL/min (A) (by C-G formula based on SCr of 1.97 mg/dL (H)).  Liver & Pancreas: Recent Labs  Lab 02/26/24 0500  AST 15  ALT 18  ALKPHOS 71  BILITOT 0.5  PROT 6.3*  ALBUMIN  1.9*    Recent Labs  Lab 02/28/24 1918 02/28/24 2325 02/29/24 0408 02/29/24 0807 02/29/24 1210  GLUCAP 142* 124* 106* 136* 127*     Microbiology: No results found for this or any previous visit (from the past 240 hours).   Radiology Studies: No results found.  Eleonore Shippee 02/29/2024  If 7PM-7AM, please contact night-coverage [email or web address] 02/29/2024, 1:23 PM

## 2024-02-29 NOTE — TOC Progression Note (Addendum)
 Transition of Care [facility]) - Progression Note    Patient Details  Name: Billy [name] MRN: [id number] Date of Birth: 1950-09-04  Transition of [facility]) CM/SW Contact  Lauraine FORBES Saa, LCSWA Phone Number: 02/29/2024, 2:33 PM  Clinical Narrative:     2:33 PM CSW made aware that patient is agreeable with therapy recommendation of discharging to SNF and requested CSW relay information to his son, Myra. CSW attempted to contact [name], but there was no response and a voicemail was left. CSW sent patient's FL2 to SNFs in [facility]. Patient expressed preference in [name]. CSW will continue to follow.  4:15 PM CSW provided SDOH (housing, social connections) resources.  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, SNF Pending bed offer               Expected Discharge Plan and Services In-house Referral: Clinical Social Work Discharge Planning Services: [employer] Consult Post Acute Care Choice: Skilled Nursing Facility Living arrangements for the past 2 months: Single Family Home                                       Social Drivers of Health (SDOH) Interventions SDOH Screenings   Food Insecurity: No Food Insecurity (01/22/2024)  Housing: High Risk (01/22/2024)  Transportation Needs: No Transportation Needs (01/22/2024)  Utilities: Not At Risk (01/22/2024)  Depression (PHQ2-9): Low Risk  (12/01/2020)  Social Connections: Socially Isolated (01/22/2024)  Tobacco Use: Low Risk  (02/19/2024)    Readmission Risk Interventions    06/27/2023    1:52 PM 10/14/2022   11:29 AM  Readmission Risk Prevention Plan  Post Dischage Appt  Complete  Medication Screening  Complete  Transportation Screening Complete Complete  HRI or Home Care Consult Complete   Social Work Consult for Recovery Care Planning/Counseling Complete   Palliative Care Screening Not Applicable   Medication Review Furniture Conservator/restorer) Referral to Pharmacy

## 2024-02-29 NOTE — [place] FL2 (Cosign Needed)
 [place]  MEDICAID FL2 LEVEL OF CARE FORM     IDENTIFICATION  Patient Name: Billy [name] Birthdate: [birth year] Sex: male Admission Date (Current Location): 01/21/2024  [facility] [place] Number:  Producer, Television/film/video and Address:  [facility], [address], [place], [place] [address]      Provider Number: [id number]  Attending Physician Name and Address:  Mcarthur Pick, MD  Relative Name and Phone Number:  Erica Osuna; [phone]    Current Level of Care: Hospital Recommended Level of Care: Skilled Nursing Facility Prior Approval Number:    Date Approved/Denied:   PASRR Number: [id number]  Discharge Plan: SNF    Current Diagnoses: Patient Active Problem List   Diagnosis Date Noted   Intractable pain 02/13/2024   Pressure injury of skin 02/13/2024   Acute on chronic systolic CHF (congestive heart failure) (HCC) 02/13/2024   Protein-calorie malnutrition, severe 02/08/2024   Acute respiratory failure with hypoxia (HCC) 02/04/2024   Ventilator dependence (HCC) 01/31/2024   Malnutrition of moderate degree 01/23/2024   Esophageal rupture 01/21/2024   Lobar pneumonia 07/10/2023   Need for management of chest tube 07/10/2023   Pleural effusion on right 07/10/2023   Aspiration pneumonia of right upper lobe (HCC) 07/09/2023   Pleural effusion 07/06/2023   Cardiac arrest (HCC) 06/26/2023   Gastritis 03/12/2023   Acute on chronic diastolic CHF (congestive heart failure) (HCC) 03/10/2023   Carotid artery stenosis 10/13/2022   Carotid artery stenosis, asymptomatic, right 10/13/2022   Asymptomatic carotid artery stenosis, left 07/28/2022   Biceps tendonitis on right    Tear of right supraspinatus tendon    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    PAF (paroxysmal atrial fibrillation) (HCC) 09/13/2018   Upper GI bleeding 07/27/2018   History of non-ST elevation myocardial infarction (NSTEMI)  06/15/2018   AKI (acute kidney injury) 04/11/2018   Gastrointestinal hemorrhage with melena 04/11/2018   Acute blood loss anemia 04/11/2018   Metabolic acidosis, normal anion gap (NAG) 04/11/2018   Carotid artery disease 01/17/2018   Hyperlipidemia 01/17/2018   Peripheral neuropathy 12/25/2017   Hypertension 12/12/2017   Transient neurologic deficit 12/12/2017   History of CVA (cerebrovascular accident) 12/12/2017   Transient ischemic attack    Hypertensive 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 PEG)  AMBULATORY STATUS COMMUNICATION OF NEEDS Skin   Limited Assist Verbally PU Stage and Appropriate Care, Surgical wounds (Pressure Injury Foot Anterior;Right Stage 1 and Pressure Injury Foot Anterior;Left Stage 1 and Closed Surgical Incision Abdomen)                       Personal Care Assistance Level of Assistance  Bathing, Dressing, Feeding Bathing Assistance: Limited assistance Feeding assistance: Limited assistance Dressing Assistance: Limited assistance     Functional Limitations Info  Sight, Hearing Sight Info: Impaired (R and L) Hearing Info: Impaired (R and L)      SPECIAL CARE FACTORS FREQUENCY  PT (By licensed PT), OT (By licensed OT)  PT Frequency: 5x OT Frequency: 5x            Contractures Contractures Info: Not present    Additional Factors Info  Code Status, Allergies, Insulin  Sliding Scale Code Status Info: Full Code Allergies Info: Norvasc  (amlodipine ) and Jardiance  (empagliflozin ) and Morphine And Codeine    Insulin  Sliding Scale Info: Please see discharge summary       Current Medications (02/29/2024):  This is the current hospital active medication list Current Facility-Administered Medications  Medication Dose Route Frequency Provider Last Rate Last Admin   acetaminophen  (TYLENOL ) 160 MG/5ML solution 650 mg  650 mg Per Tube Q8H PRN Gonfa, Taye T, MD   650 mg at 02/26/24 2229   alum & mag hydroxide-simeth (MAALOX/MYLANTA) 200-200-20 MG/5ML suspension 30 mL  30 mL Per Tube Q4H PRN Krishnan, Gokul, MD   30 mL at 02/27/24 2307   amiodarone  (PACERONE ) tablet 200 mg  200 mg Per Tube BID Pham, Minh Q, RPH-CPP   200 mg at 02/29/24 0827   apixaban  (ELIQUIS ) tablet 5 mg  5 mg Per Tube BID Pham, Minh Q, RPH-CPP   5 mg at 02/29/24 9171   Chlorhexidine  Gluconate Cloth 2 % PADS 6 each  6 each Topical Daily Claudene Toribio BROCKS, MD   6 each at 02/29/24 9171   diclofenac Sodium (VOLTAREN) 1 % topical gel 2 g  2 g Topical Q6H PRN Howerter, Justin B, DO   2 g at 02/26/24 1615   diphenhydrAMINE (BENADRYL) injection 12.5 mg  12.5 mg Intravenous Q6H PRN Krishnan, Gokul, MD       Or   diphenhydrAMINE (BENADRYL) 12.5 MG/5ML elixir 12.5 mg  12.5 mg Per Tube Q6H PRN Krishnan, Gokul, MD       diphenoxylate-atropine (LOMOTIL) 2.5-0.025 MG per tablet 1 tablet  1 tablet Per Tube QID PRN Krishnan, Gokul, MD   1 tablet at 02/29/24 1217   feeding supplement (PROSource TF20) liquid 60 mL  60 mL Per Tube Daily Sigdel, Santosh, MD   60 mL at 02/29/24 1216   feeding supplement (VITAL 1.5 CAL) liquid 1,000 mL  1,000 mL Per Tube Continuous Krishnan, Gokul, MD 60 mL/hr at 02/29/24 1239 Infusion Verify at 02/29/24 1239   fiber supplement (BANATROL TF) liquid 60 mL  60 mL Per Tube QID Verdene Purchase, MD   60 mL at 02/29/24 1216   fluconazole (DIFLUCAN) tablet 200 mg  200 mg Per Tube Daily Krishnan, Gokul, MD   200 mg at 02/29/24 0827   free water  100 mL  100 mL Per Tube Q6H Sigdel, Santosh, MD   100 mL at 02/29/24 9170   [name] butt  cream   Topical BID Gonfa, Taye T, MD   Given at 02/29/24 9171   hydrALAZINE  (APRESOLINE ) tablet 100 mg  100 mg Per Tube Q8H Lee, Jordan, NP   100 mg at 02/29/24 1303   HYDROmorphone  (DILAUDID ) injection 0.5 mg  0.5 mg Intravenous Q3H PRN Krishnan, Gokul, MD   0.5 mg at 02/29/24 1217   insulin  aspart (novoLOG ) injection 0-9 Units  0-9 Units Subcutaneous Q4H Krishnan, Gokul, MD   1 Units at 02/29/24 1216   levalbuterol (XOPENEX) nebulizer solution 0.63 mg  0.63 mg Nebulization Q6H PRN Ogan, Okoronkwo U, MD   0.63 mg at 02/02/24 2045   lidocaine  (LIDODERM ) 5 % 1 patch  1 patch Transdermal Q24H Harold Scholz, MD   1 patch at 02/29/24 0846   lip balm (CARMEX) ointment   Topical PRN Gonfa, Taye T, MD  Given at 02/21/24 1000   losartan (COZAAR) tablet 25 mg  25 mg Per Tube Daily Krishnan, Gokul, MD   25 mg at 02/29/24 0827   metoCLOPramide (REGLAN) injection 5 mg  5 mg Intravenous Q8H PRN Krishnan, Gokul, MD   5 mg at 02/22/24 2028   multivitamin with minerals tablet 1 tablet  1 tablet Per Tube Daily Sigdel, Santosh, MD   1 tablet at 02/29/24 0827   naloxone  (NARCAN ) injection 0.4 mg  0.4 mg Intravenous PRN Sharie Bourbon, MD       And   sodium chloride  flush (NS) 0.9 % injection 9 mL  9 mL Intravenous PRN Sharie Bourbon, MD       Oral care mouth rinse  15 mL Mouth Rinse PRN Sharie Bourbon, MD       oxyCODONE  (ROXICODONE ) 5 MG/5ML solution 5 mg  5 mg Per Tube Q4H PRN Gonfa, Taye T, MD   5 mg at 02/29/24 1021   pantoprazole  (PROTONIX ) injection 40 mg  40 mg Intravenous Q12H Pham, Minh Q, RPH-CPP   40 mg at 02/29/24 0827   piperacillin -tazobactam (ZOSYN ) IVPB 3.375 g  3.375 g Intravenous Q8H Lightfoot, Harrell O, MD 12.5 mL/hr at 02/29/24 1304 3.375 g at 02/29/24 1304   promethazine (PHENERGAN) 12.5 mg in sodium chloride  0.9 % 50 mL IVPB  12.5 mg Intravenous Q6H PRN Krishnan, Gokul, MD 150 mL/hr at 02/26/24 1153 12.5 mg at 02/26/24 1153   saccharomyces boulardii (FLORASTOR) capsule 250 mg  250  mg Per Tube BID Krishnan, Gokul, MD   250 mg at 02/29/24 0829   simethicone  (MYLICON) 40 MG/0.6ML suspension 80 mg  80 mg Per Tube QID PRN Mansy, Jan A, MD   80 mg at 02/28/24 2129   sodium chloride  flush (NS) 0.9 % injection 10-40 mL  10-40 mL Intracatheter Q12H Gretta Doffing P, DO   10 mL at 02/29/24 9170   sodium chloride  flush (NS) 0.9 % injection 10-40 mL  10-40 mL Intracatheter PRN Gretta Doffing SQUIBB, DO         Discharge Medications: Please see discharge summary for a list of discharge medications.  Relevant Imaging Results:  Relevant Lab Results:   Additional Information SSN: [id number]  Lauraine FORBES Saa, LCSWA

## 2024-03-01 DIAGNOSIS — K223 Perforation of esophagus: Secondary | ICD-10-CM | POA: Diagnosis not present

## 2024-03-01 LAB — BASIC METABOLIC PANEL WITH GFR
Anion gap: 10 (ref 5–15)
BUN: 48 mg/dL — ABNORMAL HIGH (ref 8–23)
CO2: 20 mmol/L — ABNORMAL LOW (ref 22–32)
Calcium: 8.6 mg/dL — ABNORMAL LOW (ref 8.9–10.3)
Chloride: 111 mmol/L (ref 98–111)
Creatinine, Ser: 1.83 mg/dL — ABNORMAL HIGH (ref 0.61–1.24)
GFR, Estimated: 39 mL/min — ABNORMAL LOW (ref >60)
Glucose, Bld: 138 mg/dL — ABNORMAL HIGH (ref 70–99)
Potassium: 4.3 mmol/L (ref 3.5–5.1)
Sodium: 141 mmol/L (ref 135–145)

## 2024-03-01 LAB — GLUCOSE, CAPILLARY
Glucose-Capillary: 105 mg/dL — ABNORMAL HIGH (ref 70–99)
Glucose-Capillary: 112 mg/dL — ABNORMAL HIGH (ref 70–99)
Glucose-Capillary: 115 mg/dL — ABNORMAL HIGH (ref 70–99)
Glucose-Capillary: 117 mg/dL — ABNORMAL HIGH (ref 70–99)
Glucose-Capillary: 128 mg/dL — ABNORMAL HIGH (ref 70–99)
Glucose-Capillary: 156 mg/dL — ABNORMAL HIGH (ref 70–99)

## 2024-03-01 LAB — MAGNESIUM: Magnesium: 2.4 mg/dL (ref 1.7–2.4)

## 2024-03-01 MED ORDER — LOSARTAN POTASSIUM 25 MG PO TABS
50.0000 mg | ORAL_TABLET | Freq: Every day | ORAL | Status: DC
  Administered 2024-03-02 – 2024-03-05 (×4): 50 mg
  Filled 2024-03-01 (×4): qty 2

## 2024-03-01 MED ORDER — SALINE SPRAY 0.65 % NA SOLN
1.0000 | NASAL | Status: DC | PRN
  Administered 2024-03-01 – 2024-04-13 (×4): 1 via NASAL
  Filled 2024-03-01 (×9): qty 44

## 2024-03-01 NOTE — Progress Notes (Signed)
 PROGRESS NOTE  Billy [name] FMW:[id number] DOB: April 16, 1951   PCP: Seabron Lenis, MD  Patient is from: Home.  DOA: 01/21/2024 LOS: 40   Brief Narrative / Interim history: Patient is a 73 year old male past medical history significant for cardiac arrest earlier this year, HFrEF, Afib and PE.  Patient presented with several days of nausea and vomiting from a stomach bug followed by sudden onset severe tearing epigastric pain radiating to back and chest. Workup revealed esophageal tear/Boerhaave. Patient had esophageal stent placed on 01/23/2024.  Subsequent swallow study revealed an additional leak, and he had stent repositioned on 01/31/2024.  He became hypoxic with gastric contents noted on intubation, and he was transferred to ICU for mechanical ventilation.  Bedside ultrasound with right pleural effusion for which he had chest tube placed with removal of 300 cc exudative fluid.  He was also successfully extubated on 10/16.  Patient had increased oxygen  requirement requiring reintubation on 02/05/2024.  Bedside ultrasound showed left-sided pleural effusion and he had left-sided chest tube with 120 cc exudative output.  ICU stay was complicated by high O2 requirement, left sided pleural effusion, chest tube placement 10/16, reintubated 10/20.  Patient was eventually extubated, and transferred out of ICU on 02/13/2024.    Hospital course has been complicated by acute on chronic HFpEF for which he is on IV Lasix  per advanced heart failure team. -PEG tube placed on 11/3.  Dietitian consulted for initiation of tube feed.  Remains on TPN until tube feed at goal.  Subjective: No new complaints.  Assessment and plan:  Esophageal rupture/ Boerhaave syndrome Likely from excessive vomiting -S/p stent placement on 10/7 -S/p stent repositioning on 10/16 due to leakage -Esophagogram 10/24 shows persistent leak -PEG tube placed on 11/3.  Currently tolerating tube feed okay, moving towards goal -Continue  TPN until TF at goal Both chest tubes are out, currently on IV Zosyn , duration of antibiotics per cardiothoracic surgery team.  Acute hypoxic/ hypercapnic respiratory failure- Multifactorial including aspiration pneumonia, pleural effusion and CHF.  Trach aspirate on 10/15 with Candida albicans. -IV meropenem>> IV Zosyn  for pneumonia per CTS. -Diflucan for Candida albicans from trach aspirate from 10/15-11/4. -Continue bronchodilators Respiratory status is stable   Acute on chronic diastolic CHF -Limited TTE on 10/23 with LVEF of 60 to 65%.  RHC in 3/25 with mild PAH with severe RV failure but preserved CO.  Appears euvolemic on exam. -Heart failure team has been following.  Patient was started on diuretics and Entresto  however these have been discontinued now due to elevation in creatinine. -Patient now on hydralazine  with plans to initiate nitrates when he is able to take orally.   AKI on CKD-3B:  Baseline Cr~2.0.   Renal function worsened with diuretics and Entresto  both of which were held.  Now improving. Monitor urine output.  Avoid nephrotoxic agents.   03/01/2024: BMP reveals sodium of 141, potassium of 4.3, chloride 111, CO2 of 20, BUN of 48 and serum creatinine of 1.83.     Paroxysmal A-fib:  s/p TEE DCCV 3/25.  Seems to be in sinus rhythm. Transitioned to amiodarone  and Eliquis  through tube.   Intractable pain -Required ketamine and started on IV Dilaudid  PCA while in ICU. -Off Dilaudid  pump 11/12, currently on p.o. oxy as well as IV Dilaudid  pushes as needed Placed fentanyl  patch 11/10 night, will discontinue.  H/o PE/ DVT-  S/p infrarenal IVC placement 3/25. V/Q 3/25 with multiple perfusion defects Now on Eliquis .  Essential hypertension:  Blood pressure is reasonably well-controlled.  Patient is currently on hydralazine .  Nausea and diarrhea Patient with persistent nausea and diarrhea.  Medication changes were made.  He is now on Phenergan and Lomotil as needed.   Continue probiotics.  Will check labs in the a.m. and replace electrolytes as needed.  Unwitnessed fall in the hospital:  Found down on the floor the evening of 10/31.  No apparent injury or focal neurodeficit. Fall precaution  Anxiety disorder:  Stable. Ativan  as needed   Acute on chronic anemia Hemoglobin has been stable.  No evidence of overt blood loss   GERD - IV PPI.  Physical deconditioning: PT/OT  Severe protein calorie malnutrition/hypoalbuminemia Body mass index is 21.23 kg/m. Nutrition Problem: Severe Malnutrition Etiology: acute illness (may have chronic component due to time frame of reported wt loss) Signs/Symptoms: severe fat depletion, moderate muscle depletion Interventions: Refer to RD note for recommendations Patient was started on TPN.  G-tube was subsequently placed.  Now on tube feedings.  TPN being weaned down gradually.  Pressure skin injury Wound 02/07/24 0900 Pressure Injury Foot Anterior;Right Stage 1 -  Intact skin with non-blanchable redness of a localized area usually over a bony prominence. (Active)     Wound 02/07/24 0900 Pressure Injury Foot Anterior;Left Stage 1 -  Intact skin with non-blanchable redness of a localized area usually over a bony prominence. (Active)     Wound 02/07/24 0900 Pressure Injury Buttocks Right Stage 1 -  Intact skin with non-blanchable redness of a localized area usually over a bony prominence. (Active)   DVT prophylaxis: On Eliquis  now Code Status: Full code Family Communication: None at bedside today. Disposition:  SNF when medically stable  Consultants:  Gastroenterology Critical care Cardiology Cardiothoracic surgery  Microbiology summarized: 10/5-MRSA PCR screen positive 10/15-trach aspirate culture with Candida albicans.  Objective: Vitals:   03/01/24 0524 03/01/24 0718 03/01/24 1059 03/01/24 1626  BP:  (!) 158/56 (!) 133/57 (!) 159/56  Pulse:      Resp:  19 18   Temp:  98 F (36.7 C) 98 F (36.7  C) 98 F (36.7 C)  TempSrc:  Oral Oral Oral  SpO2:  96% 97% 94%  Weight: 73 kg     Height:        Examination: General condition: Awake and alert.  Not in any distress. Resp: Clear to auscultation bilaterally.  Normal effort Cardio: S1-S2 is normal regular.  No S3-S4.  No rubs murmurs or bruit GI: Abdomen is soft.  Nontender nondistended.  Bowel sounds are present normal.  No masses organomegaly.  PEG tube is present Extremities: + edema.  Full range of motion of lower extremities. Neurologic: Alert and oriented x3.     Sch Meds:  Scheduled Meds:  amiodarone   200 mg Per Tube BID   apixaban   5 mg Per Tube BID   Chlorhexidine  Gluconate Cloth  6 each Topical Daily   feeding supplement (PROSource TF20)  60 mL Per Tube Daily   fiber supplement (BANATROL TF)  60 mL Per Tube QID   fluconazole  200 mg Per Tube Daily   free water   100 mL Per Tube Q6H   [name] butt cream   Topical BID   hydrALAZINE   100 mg Per Tube Q8H   insulin  aspart  0-9 Units Subcutaneous Q4H   lidocaine   1 patch Transdermal Q24H   [START ON 03/02/2024] losartan  50 mg Per Tube Daily   multivitamin with minerals  1 tablet Per Tube Daily   pantoprazole  (PROTONIX ) IV  40 mg  Intravenous Q12H   saccharomyces boulardii  250 mg Per Tube BID   sodium chloride  flush  10-40 mL Intracatheter Q12H   Continuous Infusions:  feeding supplement (VITAL 1.5 CAL) 60 mL/hr at 03/01/24 0348   piperacillin -tazobactam (ZOSYN )  IV 3.375 g (03/01/24 1438)   promethazine (PHENERGAN) injection (IM or IVPB) 150 mL/hr at 03/01/24 0348   PRN Meds:.acetaminophen  (TYLENOL ) oral liquid 160 mg/5 mL, alum & mag hydroxide-simeth, diclofenac Sodium, diphenhydrAMINE **OR** diphenhydrAMINE, [COMPLETED] diphenoxylate-atropine **FOLLOWED BY** diphenoxylate-atropine, HYDROmorphone  (DILAUDID ) injection, levalbuterol, lip balm, metoCLOPramide (REGLAN) injection, naloxone  **AND** sodium chloride  flush, mouth rinse, oxyCODONE , promethazine (PHENERGAN)  injection (IM or IVPB), simethicone , sodium chloride  flush  Antimicrobials: Anti-infectives (From admission, onward)    Start     Dose/Rate Route Frequency Ordered Stop   02/22/24 1844  fluconazole (DIFLUCAN) tablet 200 mg        200 mg Per Tube Daily 02/22/24 1844 03/04/24 0959   02/22/24 1630  fluconazole (DIFLUCAN) tablet 200 mg  Status:  Discontinued        200 mg Oral Daily 02/22/24 1537 02/22/24 1844   02/13/24 0900  piperacillin -tazobactam (ZOSYN ) IVPB 3.375 g        3.375 g 12.5 mL/hr over 240 Minutes Intravenous Every 8 hours 02/13/24 0812     02/07/24 0800  vancomycin (VANCOCIN) IVPB 1000 mg/200 mL premix  Status:  Discontinued        1,000 mg 200 mL/hr over 60 Minutes Intravenous Every 24 hours 02/07/24 0632 02/09/24 0905   02/06/24 2200  meropenem (MERREM) 1 g in sodium chloride  0.9 % 100 mL IVPB  Status:  Discontinued        1 g 200 mL/hr over 30 Minutes Intravenous Every 12 hours 02/06/24 1127 02/13/24 0812   02/06/24 1300  vancomycin (VANCOREADY) IVPB 1750 mg/350 mL  Status:  Discontinued        1,750 mg 175 mL/hr over 120 Minutes Intravenous Every 48 hours 02/04/24 1225 02/04/24 1226   02/06/24 0800  vancomycin (VANCOCIN) IVPB 1000 mg/200 mL premix        1,000 mg 200 mL/hr over 60 Minutes Intravenous  Once 02/06/24 0629 02/07/24 0829   02/04/24 1400  meropenem (MERREM) 1 g in sodium chloride  0.9 % 100 mL IVPB  Status:  Discontinued        1 g 200 mL/hr over 30 Minutes Intravenous Every 8 hours 02/04/24 1208 02/06/24 1127   02/04/24 1300  linezolid  (ZYVOX ) IVPB 600 mg  Status:  Discontinued        600 mg 300 mL/hr over 60 Minutes Intravenous Every 12 hours 02/04/24 1202 02/04/24 1208   02/04/24 1300  vancomycin (VANCOREADY) IVPB 2000 mg/400 mL        2,000 mg 200 mL/hr over 120 Minutes Intravenous  Once 02/04/24 1208 02/04/24 1623   02/04/24 1208  vancomycin variable dose per unstable renal function (pharmacist dosing)  Status:  Discontinued         Does not apply  See admin instructions 02/04/24 1208 02/07/24 0632   01/31/24 1030  fluconazole (DIFLUCAN) IVPB 200 mg  Status:  Discontinued        200 mg 100 mL/hr over 60 Minutes Intravenous Daily 01/31/24 0938 02/20/24 1218   01/30/24 1445  piperacillin -tazobactam (ZOSYN ) IVPB 3.375 g  Status:  Discontinued        3.375 g 12.5 mL/hr over 240 Minutes Intravenous Every 8 hours 01/30/24 1354 02/04/24 1203   01/26/24 1000  amoxicillin-clavulanate (AUGMENTIN) 400-57 MG/5ML suspension 875 mg  Status:  Discontinued        875 mg Oral Every 12 hours 01/26/24 0658 01/30/24 1354   01/26/24 1000  fluconazole (DIFLUCAN) 40 MG/ML suspension 200 mg  Status:  Discontinued        200 mg Oral Daily 01/26/24 0658 01/31/24 0938   01/22/24 1445  fluconazole (DIFLUCAN) IVPB 400 mg  Status:  Discontinued        400 mg 100 mL/hr over 120 Minutes Intravenous Every 24 hours 01/22/24 1436 01/22/24 1439   01/22/24 1445  fluconazole (DIFLUCAN) IVPB 200 mg  Status:  Discontinued        200 mg 100 mL/hr over 60 Minutes Intravenous Every 24 hours 01/22/24 1439 01/26/24 0658   01/21/24 2200  piperacillin -tazobactam (ZOSYN ) IVPB 3.375 g  Status:  Discontinued       Placed in Followed by Linked Group   3.375 g 12.5 mL/hr over 240 Minutes Intravenous Every 8 hours 01/21/24 1607 01/26/24 0658   01/21/24 1615  piperacillin -tazobactam (ZOSYN ) IVPB 3.375 g       Placed in Followed by Linked Group   3.375 g 100 mL/hr over 30 Minutes Intravenous  Once 01/21/24 1607 01/21/24 1651        CBC: Recent Labs  Lab 02/24/24 0405 02/25/24 0500 02/26/24 0500  WBC 10.7* 11.1* 11.7*  HGB 12.0* 11.9* 12.3*  HCT 38.2* 38.0* 39.6  MCV 88.6 88.8 89.8  PLT 250 260 261   BMP &GFR Recent Labs  Lab 02/24/24 0405 02/25/24 0500 02/26/24 0500 02/27/24 0521 02/29/24 0500 03/01/24 0413  NA 143 142 145  --  143 141  K 4.3 4.2 4.5  --  4.4 4.3  CL 102 105 109  --  113* 111  CO2 26 24 22   --  21* 20*  GLUCOSE 145* 164* 153*  --  109*  138*  BUN 115* 105* 83*  --  57* 48*  CREATININE 2.98* 2.65* 2.26*  --  1.97* 1.83*  CALCIUM  8.9 8.9 8.9  --  8.7* 8.6*  MG 2.3 2.7* 2.8* 2.7*  --  2.4  PHOS 4.4 4.1 3.4  --   --   --    Estimated Creatinine Clearance: 38.2 mL/min (A) (by C-G formula based on SCr of 1.83 mg/dL (H)).  Liver & Pancreas: Recent Labs  Lab 02/26/24 0500  AST 15  ALT 18  ALKPHOS 71  BILITOT 0.5  PROT 6.3*  ALBUMIN  1.9*    Recent Labs  Lab 02/29/24 2311 03/01/24 0345 03/01/24 0721 03/01/24 1135 03/01/24 1624  GLUCAP 114* 128* 115* 156* 117*     Microbiology: No results found for this or any previous visit (from the past 240 hours).   Radiology Studies: No results found.  Time spent: 55 minutes.  Leatrice LILLETTE Chapel 03/01/2024  If 7PM-7AM, please contact night-coverage [email or web address] 03/01/2024, 5:55 PM

## 2024-03-01 NOTE — TOC Progression Note (Addendum)
 Transition of Care [facility]) - Progression Note    Patient Details  Name: Billy [name] MRN: [id number] Date of Birth: 1950/07/28  [facility] CM/SW Contact  Lauraine FORBES Saa, LCSWA Phone Number: 03/01/2024, 4:24 PM  Clinical Narrative:     4:24 PM CSW provided patient and patient's son [name] ([email or web address]) with patient's current SNF options [facility], [facility], [facility], [facility], [facility] SNF) and SNF Medicare ratings. CSW will continue to follow.  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, SNF Pending bed offer               Expected Discharge Plan and Services In-house Referral: Clinical Social Work Discharge Planning Services: [employer] Consult Post Acute Care Choice: Skilled Nursing Facility Living arrangements for the past 2 months: Single Family Home                                       Social Drivers of Health (SDOH) Interventions SDOH Screenings   Food Insecurity: No Food Insecurity (01/22/2024)  Housing: High Risk (01/22/2024)  Transportation Needs: No Transportation Needs (01/22/2024)  Utilities: Not At Risk (01/22/2024)  Depression (PHQ2-9): Low Risk  (12/01/2020)  Social Connections: Socially Isolated (01/22/2024)  Tobacco Use: Low Risk  (02/19/2024)    Readmission Risk Interventions    06/27/2023    1:52 PM 10/14/2022   11:29 AM  Readmission Risk Prevention Plan  Post Dischage Appt  Complete  Medication Screening  Complete  Transportation Screening Complete Complete  HRI or Home Care Consult Complete   Social Work Consult for Recovery Care Planning/Counseling Complete   Palliative Care Screening Not Applicable   Medication Review Oceanographer) Referral to Pharmacy

## 2024-03-01 NOTE — Progress Notes (Signed)
 Physical Therapy Treatment Patient Details Name: Suhaas Agena MRN: [id number] DOB: [birth year] Today's Date: 03/01/2024   History of Present Illness Berish Bohman is a 73 y.o. male who presented to [facility] ED 01/21/24 with several days of vomiting followed by an episode of severe tearing epigastric pain. Work-up revealed esophageal tear. Pt s/p esophageal stent with EGD 10/7. Return to OR for esophageal stent adjustment on 10/15, remained intubated after procedure due to suspected aspiration. Extubated 10/16.  Pt had CT placed 10/16. Reintubated 10/20-10/24. G tube placed 11/3. He accidentally pulled out the R chest tube on 11/7. MD plan for  PMHx: HFrEF, AFib, CAD, PE complicated by cardiogenic shock, CKD 4, MI, peripheral neuropathy, and TIA. L chest tube removed 11/11    PT Comments  Pt admitted with above diagnosis. Pt met 0/4 goals set and goals revised. Pt with multiple medical issues affectiving progress. Today, pt did much better and was able to ambulate with RW in hallways and progress ambulation with min assist +2 for safety.  Pt continues to need mod cues for safety. Needs post acute rehab < 3 hours day.  Pt currently with functional limitations due to the deficits listed below (see PT Problem List). Pt will benefit from acute skilled PT to increase their independence and safety with mobility to allow discharge.       If plan is discharge home, recommend the following: Assistance with cooking/housework;Assist for transportation;Help with stairs or ramp for entrance;A lot of help with bathing/dressing/bathroom;Direct supervision/assist for medications management;Direct supervision/assist for financial management;Supervision due to cognitive status;A lot of help with walking and/or transfers   Can travel by private vehicle        Equipment Recommendations  Wheelchair (measurements PT);Wheelchair cushion (measurements PT);Rolling walker (2 wheels)    Recommendations for Other Services        Precautions / Restrictions Precautions Precautions: Fall Recall of Precautions/Restrictions: Impaired Precaution/Restrictions Comments: G-tube Restrictions Weight Bearing Restrictions Per Provider Order: No     Mobility  Bed Mobility Overal bed mobility: Needs Assistance   Rolling: Contact guard assist Sidelying to sit: Min assist, Used rails, HOB elevated   Sit to supine: Contact guard assist   General bed mobility comments: pt able to come to long sit in bed using B bed rails, min assist for supine to sit at EOB to raise trunk    Transfers Overall transfer level: Needs assistance Equipment used: Rolling walker (2 wheels) Transfers: Sit to/from Stand Sit to Stand: Min assist           General transfer comment: Needed cues for hand placement and a little assist to steady upon rising.    Ambulation/Gait Ambulation/Gait assistance: Min assist, +2 safety/equipment Gait Distance (Feet): 180 Feet Assistive device: Rolling walker (2 wheels) Gait Pattern/deviations: Trunk flexed, Drifts right/left, Step-through pattern, Decreased stride length, Wide base of support Gait velocity: reduced Gait velocity interpretation: <1.31 ft/sec, indicative of household ambulator   General Gait Details: Pt required mod verbal cues to stay close within walker however appears to be steadier today than last session.  Pt with frequent cues for activity pacing/energy conservation and environmental scanning/awareness. Pt did not need a seated rest break today and was able to incr distance and even negotiate around some obstacles in hallway.  Pt with difficulty maintaining cervical extension but slightly improved this session with frequent cues.   Stairs             Wheelchair Mobility     Tilt Bed    Modified  Rankin (Stroke Patients Only)       Balance Overall balance assessment: Needs assistance Sitting-balance support: Feet supported, No upper extremity supported Sitting  balance-Leahy Scale: Good     Standing balance support: Reliant on assistive device for balance, During functional activity, Bilateral upper extremity supported Standing balance-Leahy Scale: Poor Standing balance comment: Dependent on RW                            Communication    Cognition Arousal: Alert Behavior During Therapy: Flat affect                             Following commands: Impaired Following commands impaired: Follows one step commands with increased time, Only follows one step commands consistently    Cueing Cueing Techniques: Verbal cues, Visual cues  Exercises General Exercises - Lower Extremity Ankle Circles/Pumps: AROM, Both, 5 reps, Supine (cues to perform hourly as able) Quad Sets: Strengthening, Both, 10 reps, Supine Long Arc Quad: AROM, Both, 10 reps, Seated Hip Flexion/Marching: Both, 10 reps, Seated, AAROM    General Comments        Pertinent Vitals/Pain Pain Assessment Pain Assessment: No/denies pain    Home Living                          Prior Function            PT Goals (current goals can now be found in the care plan section) Acute Rehab PT Goals PT Goal Formulation: With patient Time For Goal Achievement: 03/15/24 Potential to Achieve Goals: Good Progress towards PT goals: Progressing toward goals    Frequency    Min 2X/week      PT Plan      Co-evaluation              AM-PAC PT 6 Clicks Mobility   Outcome Measure  Help needed turning from your back to your side while in a flat bed without using bedrails?: A Little Help needed moving from lying on your back to sitting on the side of a flat bed without using bedrails?: A Lot Help needed moving to and from a bed to a chair (including a wheelchair)?: A Little Help needed standing up from a chair using your arms (e.g., wheelchair or bedside chair)?: A Little Help needed to walk in hospital room?: A Lot Help needed climbing 3-5  steps with a railing? : A Lot 6 Click Score: 15    End of Session Equipment Utilized During Treatment: Gait belt Activity Tolerance: Patient tolerated treatment well;Patient limited by fatigue Patient left: in bed;with call bell/phone within reach;with bed alarm set Nurse Communication: Mobility status PT Visit Diagnosis: Difficulty in walking, not elsewhere classified (R26.2);Unsteadiness on feet (R26.81);Other abnormalities of gait and mobility (R26.89);Muscle weakness (generalized) (M62.81);Other symptoms and signs involving the nervous system (R29.898);Pain;History of falling (Z91.81) Pain - part of body:  (Back, neck)     Time: 1240-1305 PT Time Calculation (min) (ACUTE ONLY): 25 min  Charges:    $Gait Training: 8-22 mins $Therapeutic Exercise: 8-22 mins PT General Charges $$ ACUTE PT VISIT: 1 Visit                     Felisa Zechman M,PT Acute Rehab Services [phone]    Stephane JULIANNA Bevel 03/01/2024, 4:17 PM

## 2024-03-01 NOTE — Progress Notes (Signed)
 Occupational Therapy Treatment Patient Details Name: Billy [name] MRN: [id number] DOB: 15-Aug-1950 Today's Date: 03/01/2024   History of present illness Son Billy [name] is a 73 y.o. male who presented to [facility] ED 01/21/24 with several days of vomiting followed by an episode of severe tearing epigastric pain. Work-up revealed esophageal tear. Pt s/p esophageal stent with EGD 10/7. Return to OR for esophageal stent adjustment on 10/15, remained intubated after procedure due to suspected aspiration. Extubated 10/16.  Pt had CT placed 10/16. Reintubated 10/20-10/24. G tube placed 11/3. He accidentally pulled out the R chest tube on 11/7. MD plan for  PMHx: HFrEF, AFib, CAD, PE complicated by cardiogenic shock, CKD 4, MI, peripheral neuropathy, and TIA. L chest tube removed 11/11   OT comments  Pt completed UB bathing and dressing with min assist. Total assist for pericare in standing, min assist to stand from bed with RW. Pt continues to have loose stools and incontinence. Declined removal of compression socks to wash feet. Pt completed grooming tasks at EOB with supervision. Refused to sit up in chair due to discomfort and reports having been left up in chair for an extended period of time.  Patient will benefit from continued inpatient follow up therapy, <3 hours/day       If plan is discharge home, recommend the following:  Two people to help with walking and/or transfers;Assistance with cooking/housework;Direct supervision/assist for medications management;Direct supervision/assist for financial management;Assist for transportation;Help with stairs or ramp for entrance;A lot of help with bathing/dressing/bathroom   Equipment Recommendations  Other (comment) (defer)    Recommendations for Other Services      Precautions / Restrictions Precautions Precautions: Fall Recall of Precautions/Restrictions: Impaired Precaution/Restrictions Comments: G-tube Restrictions Weight Bearing Restrictions Per Provider  Order: No       Mobility Bed Mobility Overal bed mobility: Needs Assistance       Supine to sit: Min assist, HOB elevated Sit to supine: Min assist   General bed mobility comments: pt able to come to long sit in bed using B bed rails, min assist for supine to sit at EOB to raise trunk, min assist for LEs back into be    Transfers Overall transfer level: Needs assistance Equipment used: Rolling walker (2 wheels) Transfers: Sit to/from Stand Sit to Stand: Min assist           General transfer comment: stood to complete pericare, declined OOB to chair     Balance Overall balance assessment: Needs assistance Sitting-balance support: Feet supported, No upper extremity supported Sitting balance-Leahy Scale: Good     Standing balance support: Reliant on assistive device for balance, During functional activity, Bilateral upper extremity supported Standing balance-Leahy Scale: Poor                             ADL either performed or assessed with clinical judgement   ADL       Grooming: Wash/dry hands;Wash/dry face;Oral care;Brushing hair;Sitting;Supervision/safety   Upper Body Bathing: Minimal assistance;Sitting     Lower Body Bathing Details (indicate cue type and reason): declined removal of compression socks to wash feet and lower legs Upper Body Dressing : Set up;Sitting           Toileting- Clothing Manipulation and Hygiene: Total assistance;Sit to/from stand Toileting - Clothing Manipulation Details (indicate cue type and reason): due to diarrhea            Extremity/Trunk Assessment  Vision       Perception     Praxis     Communication     Cognition Arousal: Alert Behavior During Therapy: Flat affect Cognition: Cognition impaired Difficult to assess due to: Impaired communication     Memory impairment (select all impairments): Short-term memory, Declarative long-term memory, Working memory Attention  impairment (select first level of impairment): Sustained attention Executive functioning impairment (select all impairments): Organization, Reasoning, Problem solving, Sequencing OT - Cognition Comments: needing repeated reminders of goal of session, loses attention to task requiring verbal cues to resume                 Following commands: Impaired Following commands impaired: Follows one step commands with increased time, Only follows one step commands consistently      Cueing   Cueing Techniques: Verbal cues, Visual cues  Exercises      Shoulder Instructions       General Comments      Pertinent Vitals/ Pain       Pain Assessment Pain Assessment: No/denies pain  Home Living                                          Prior Functioning/Environment              Frequency  Min 2X/week        Progress Toward Goals  OT Goals(current goals can now be found in the care plan section)  Progress towards OT goals: Progressing toward goals  Acute Rehab OT Goals OT Goal Formulation: With patient Time For Goal Achievement: 03/04/24 Potential to Achieve Goals: Fair  Plan      Co-evaluation                 AM-PAC OT 6 Clicks Daily Activity     Outcome Measure   Help from another person eating meals?: Total (NPO) Help from another person taking care of personal grooming?: A Little Help from another person toileting, which includes using toliet, bedpan, or urinal?: Total Help from another person bathing (including washing, rinsing, drying)?: A Lot Help from another person to put on and taking off regular upper body clothing?: A Little Help from another person to put on and taking off regular lower body clothing?: A Lot 6 Click Score: 12    End of Session Equipment Utilized During Treatment: Rolling walker (2 wheels);Gait belt  OT Visit Diagnosis: Muscle weakness (generalized) (M62.81);Other symptoms and signs involving cognitive  function;Unsteadiness on feet (R26.81)   Activity Tolerance Patient limited by fatigue   Patient Left in bed;with call bell/phone within reach   Nurse Communication          Time: 1140-1210 OT Time Calculation (min): 30 min  Charges: OT General Charges $OT Visit: 1 Visit OT Treatments $Self Care/Home Management : 23-37 mins Mliss HERO, OTR/L Acute Rehabilitation Services Office: [phone]   Kennth Mliss Helling 03/01/2024, 12:57 PM

## 2024-03-02 DIAGNOSIS — I5023 Acute on chronic systolic (congestive) heart failure: Secondary | ICD-10-CM | POA: Diagnosis not present

## 2024-03-02 DIAGNOSIS — E43 Unspecified severe protein-calorie malnutrition: Secondary | ICD-10-CM | POA: Diagnosis not present

## 2024-03-02 DIAGNOSIS — J90 Pleural effusion, not elsewhere classified: Secondary | ICD-10-CM | POA: Diagnosis not present

## 2024-03-02 DIAGNOSIS — K223 Perforation of esophagus: Secondary | ICD-10-CM | POA: Diagnosis not present

## 2024-03-02 LAB — GLUCOSE, CAPILLARY
Glucose-Capillary: 101 mg/dL — ABNORMAL HIGH (ref 70–99)
Glucose-Capillary: 102 mg/dL — ABNORMAL HIGH (ref 70–99)
Glucose-Capillary: 113 mg/dL — ABNORMAL HIGH (ref 70–99)
Glucose-Capillary: 119 mg/dL — ABNORMAL HIGH (ref 70–99)
Glucose-Capillary: 133 mg/dL — ABNORMAL HIGH (ref 70–99)
Glucose-Capillary: 138 mg/dL — ABNORMAL HIGH (ref 70–99)

## 2024-03-02 MED ORDER — ISOSORBIDE DINITRATE 10 MG PO TABS
10.0000 mg | ORAL_TABLET | Freq: Two times a day (BID) | ORAL | Status: DC
  Administered 2024-03-02 – 2024-03-22 (×36): 10 mg
  Filled 2024-03-02 (×43): qty 1

## 2024-03-02 MED ORDER — KATE FARMS STANDARD 1.4 EN LIQD
1000.0000 mL | ENTERAL | Status: DC
  Administered 2024-03-02 – 2024-03-06 (×5): 1000 mL
  Filled 2024-03-02 (×11): qty 1000

## 2024-03-02 MED ORDER — SODIUM CHLORIDE (PF) 0.9 % IJ SOLN
INTRAMUSCULAR | Status: AC
  Administered 2024-03-02: 10 mL
  Filled 2024-03-02: qty 10

## 2024-03-02 NOTE — Progress Notes (Signed)
 PROGRESS NOTE  Billy [name] FMW:[id number] DOB: September 16, 1950   PCP: Seabron Lenis, MD  Patient is from: Home.  DOA: 01/21/2024 LOS: 41   Brief Narrative / Interim history: Patient is a 73 year old male past medical history significant for cardiac arrest earlier this year, HFrEF, Afib and PE.  Patient presented with several days of nausea and vomiting from a stomach bug followed by sudden onset severe tearing epigastric pain radiating to back and chest. Workup revealed esophageal tear/Boerhaave. Patient had esophageal stent placed on 01/23/2024.  Subsequent swallow study revealed an additional leak, and he had stent repositioned on 01/31/2024.  He became hypoxic with gastric contents noted on intubation, and he was transferred to ICU for mechanical ventilation.  Bedside ultrasound with right pleural effusion for which he had chest tube placed with removal of 300 cc exudative fluid.  He was also successfully extubated on 10/16.  Patient had increased oxygen  requirement requiring reintubation on 02/05/2024.  Bedside ultrasound showed left-sided pleural effusion and he had left-sided chest tube with 120 cc exudative output.  ICU stay was complicated by high O2 requirement, left sided pleural effusion, chest tube placement 10/16, reintubated 10/20.  Patient was eventually extubated, and transferred out of ICU on 02/13/2024.    Hospital course has been complicated by acute on chronic HFpEF for which he is on IV Lasix  per advanced heart failure team. -PEG tube placed on 11/3.  Dietitian consulted for initiation of tube feed.  Remains on TPN until tube feed at goal.  Subjective: Reporting loose stools and intermittent pleuritic chest discomfort. No overt bleeding, no fever and normal oxygen  saturation on RA. Physically weak and deconditioned.   Assessment and plan:  Esophageal rupture/ Boerhaave syndrome Likely from excessive vomiting -S/p stent placement on 10/7 -S/p stent repositioning on 10/16 due to  leakage -Esophagogram 10/24 shows persistent leak -PEG tube placed on 11/3.  Currently tolerating tube feed okay, moving towards goal -Continue TPN until TF at goal Both chest tubes are out, currently on IV Zosyn , per discussion with CTS plan is for continuation of antibiotics until esophageal stent removed.  Acute hypoxic/ hypercapnic respiratory failure- Multifactorial including aspiration pneumonia, pleural effusion and CHF.  Trach aspirate on 10/15 with Candida albicans. -IV meropenem>> IV Zosyn  for pneumonia per CTS. -Diflucan for Candida albicans from trach aspirate from 10/15-11/4. -Continue bronchodilators Respiratory status is stable   Acute on chronic diastolic CHF -Limited TTE on 10/23 with LVEF of 60 to 65%.  RHC in 3/25 with mild PAH with severe RV failure but preserved CO.  Appears euvolemic on exam. -Heart failure team has been following.  Patient was started on diuretics and Entresto  however these have been discontinued now due to elevation in creatinine. -Patient now on hydralazine  and isosorbide . -When able to tolerate by mouth can use Imdur .   AKI on CKD-3B:  Baseline Cr~2.0.   Renal function worsened with diuretics and Entresto  both of which were held.  Now improving. Monitor urine output.  Avoid nephrotoxic agents.   -Last creatinine level 1.83. - Overall stable and at baseline currently.. - Continue to maintain adequate hydration and continue to follow electrolytes and renal function intermittently.   Paroxysmal A-fib:  s/p TEE DCCV 3/25.  Seems to be in sinus rhythm. -Continue telemetry monitoring - Continue amiodarone  and Eliquis .   Intractable pain -Required ketamine and started on IV Dilaudid  PCA while in ICU. -Off Dilaudid  pump 11/12, currently on p.o. oxy as well as IV Dilaudid  pushes as needed -Placed fentanyl  patch 11/10 night, will  discontinue. - Patient reports uncontrolled pain control.  H/o PE/ DVT-  S/p infrarenal IVC placement 3/25. V/Q 3/25  with multiple perfusion defects -Continue Eliquis .  Essential hypertension:  Blood pressure is reasonably well-controlled.  Patient is currently on hydralazine .  Nausea and diarrhea Patient with persistent nausea and diarrhea.  Medication changes were made.  He is now on Phenergan and Lomotil as needed.   -Fiber - Continue to follow ultralights and replete as needed. - Continue the use of Florastor  Unwitnessed fall in the hospital:  Found down on the floor the evening of 10/31.   -No apparent injury or focal neurodeficit. -Fall precaution  Anxiety/depression disorder:  -Stable. -Continue Ativan  as needed -Overall stable mood with flat affect.   Acute on chronic anemia Hemoglobin has been stable.  No evidence of overt blood loss -Continue to follow hemoglobin trend.   GERD - Continue PPI. -Physical deconditioning: -Continue PT/OT assessment/evaluation.  Severe protein calorie malnutrition/hypoalbuminemia Body mass index is 21.38 kg/m. Nutrition Problem: Severe Malnutrition Etiology: acute illness (may have chronic component due to time frame of reported wt loss) Signs/Symptoms: severe fat depletion, moderate muscle depletion Interventions: Refer to RD note for recommendations -Continue tube feedings - TPN has been discontinued and patient otherwise stable.  Pressure skin injury Wound 02/07/24 0900 Pressure Injury Foot Anterior;Right Stage 1 -  Intact skin with non-blanchable redness of a localized area usually over a bony prominence. (Active)     Wound 02/07/24 0900 Pressure Injury Foot Anterior;Left Stage 1 -  Intact skin with non-blanchable redness of a localized area usually over a bony prominence. (Active)     Wound 02/07/24 0900 Pressure Injury Buttocks Right Stage 1 -  Intact skin with non-blanchable redness of a localized area usually over a bony prominence. (Active)   DVT prophylaxis: On Eliquis  now Code Status: Full code Family Communication: None at bedside  today. Disposition:  SNF when medically stable  Consultants:  Gastroenterology Critical care Cardiology Cardiothoracic surgery  Microbiology summarized: 10/5-MRSA PCR screen positive 10/15-trach aspirate culture with Candida albicans.  Objective: Vitals:   03/02/24 0328 03/02/24 0602 03/02/24 0742 03/02/24 1133  BP: (!) 168/66  (!) 155/58 (!) 125/52  Pulse: 78  70 75  Resp: 19  18 19   Temp: 98.2 F (36.8 C)  97.9 F (36.6 C) 98.2 F (36.8 C)  TempSrc: Oral  Oral Oral  SpO2: 100%  100% 100%  Weight:  73.5 kg    Height:        Examination: General exam: Alert, awake, oriented x 3; hard of hearing but able to communicate appropriately.  In no acute distress.  Experiencing ongoing loose stools. Respiratory system: Good saturation on room air. Cardiovascular system: Rate controlled, no rubs, no gallops, no JVD. Gastrointestinal system: Abdomen is nondistended, soft and without guarding.  Positive bowel sounds.  PEG tube in place. Central nervous system: Moving 4 limbs spontaneously.  No focal neurological deficits. Extremities: No cyanosis or clubbing; trace edema appreciated bilaterally. Skin: No rashes, lesions or ulcers Psychiatry: Judgement and insight appear normal.  Flat affect.   Sch Meds:  Scheduled Meds:  amiodarone   200 mg Per Tube BID   apixaban   5 mg Per Tube BID   Chlorhexidine  Gluconate Cloth  6 each Topical Daily   feeding supplement (PROSource TF20)  60 mL Per Tube Daily   fiber supplement (BANATROL TF)  60 mL Per Tube QID   fluconazole  200 mg Per Tube Daily   free water   100 mL Per Tube  Q6H   [name] butt cream   Topical BID   hydrALAZINE   100 mg Per Tube Q8H   insulin  aspart  0-9 Units Subcutaneous Q4H   isosorbide  dinitrate  10 mg Per Tube BID   lidocaine   1 patch Transdermal Q24H   losartan  50 mg Per Tube Daily   pantoprazole  (PROTONIX ) IV  40 mg Intravenous Q12H   saccharomyces boulardii  250 mg Per Tube BID   sodium chloride  flush  10-40  mL Intracatheter Q12H   Continuous Infusions:  feeding supplement ([place] STANDARD ENT 1.4)     piperacillin -tazobactam (ZOSYN )  IV 3.375 g (03/02/24 0555)   promethazine (PHENERGAN) injection (IM or IVPB) 150 mL/hr at 03/01/24 0348   PRN Meds:.acetaminophen  (TYLENOL ) oral liquid 160 mg/5 mL, alum & mag hydroxide-simeth, diclofenac Sodium, diphenhydrAMINE **OR** diphenhydrAMINE, [COMPLETED] diphenoxylate-atropine **FOLLOWED BY** diphenoxylate-atropine, HYDROmorphone  (DILAUDID ) injection, levalbuterol, lip balm, metoCLOPramide (REGLAN) injection, naloxone  **AND** sodium chloride  flush, mouth rinse, oxyCODONE , promethazine (PHENERGAN) injection (IM or IVPB), simethicone , sodium chloride , sodium chloride  flush  Antimicrobials: Anti-infectives (From admission, onward)    Start     Dose/Rate Route Frequency Ordered Stop   02/22/24 1844  fluconazole (DIFLUCAN) tablet 200 mg        200 mg Per Tube Daily 02/22/24 1844 03/04/24 0959   02/22/24 1630  fluconazole (DIFLUCAN) tablet 200 mg  Status:  Discontinued        200 mg Oral Daily 02/22/24 1537 02/22/24 1844   02/13/24 0900  piperacillin -tazobactam (ZOSYN ) IVPB 3.375 g        3.375 g 12.5 mL/hr over 240 Minutes Intravenous Every 8 hours 02/13/24 0812     02/07/24 0800  vancomycin (VANCOCIN) IVPB 1000 mg/200 mL premix  Status:  Discontinued        1,000 mg 200 mL/hr over 60 Minutes Intravenous Every 24 hours 02/07/24 0632 02/09/24 0905   02/06/24 2200  meropenem (MERREM) 1 g in sodium chloride  0.9 % 100 mL IVPB  Status:  Discontinued        1 g 200 mL/hr over 30 Minutes Intravenous Every 12 hours 02/06/24 1127 02/13/24 0812   02/06/24 1300  vancomycin (VANCOREADY) IVPB 1750 mg/350 mL  Status:  Discontinued        1,750 mg 175 mL/hr over 120 Minutes Intravenous Every 48 hours 02/04/24 1225 02/04/24 1226   02/06/24 0800  vancomycin (VANCOCIN) IVPB 1000 mg/200 mL premix        1,000 mg 200 mL/hr over 60 Minutes Intravenous  Once 02/06/24 0629  02/07/24 0829   02/04/24 1400  meropenem (MERREM) 1 g in sodium chloride  0.9 % 100 mL IVPB  Status:  Discontinued        1 g 200 mL/hr over 30 Minutes Intravenous Every 8 hours 02/04/24 1208 02/06/24 1127   02/04/24 1300  linezolid  (ZYVOX ) IVPB 600 mg  Status:  Discontinued        600 mg 300 mL/hr over 60 Minutes Intravenous Every 12 hours 02/04/24 1202 02/04/24 1208   02/04/24 1300  vancomycin (VANCOREADY) IVPB 2000 mg/400 mL        2,000 mg 200 mL/hr over 120 Minutes Intravenous  Once 02/04/24 1208 02/04/24 1623   02/04/24 1208  vancomycin variable dose per unstable renal function (pharmacist dosing)  Status:  Discontinued         Does not apply See admin instructions 02/04/24 1208 02/07/24 0632   01/31/24 1030  fluconazole (DIFLUCAN) IVPB 200 mg  Status:  Discontinued  200 mg 100 mL/hr over 60 Minutes Intravenous Daily 01/31/24 0938 02/20/24 1218   01/30/24 1445  piperacillin -tazobactam (ZOSYN ) IVPB 3.375 g  Status:  Discontinued        3.375 g 12.5 mL/hr over 240 Minutes Intravenous Every 8 hours 01/30/24 1354 02/04/24 1203   01/26/24 1000  amoxicillin-clavulanate (AUGMENTIN) 400-57 MG/5ML suspension 875 mg  Status:  Discontinued        875 mg Oral Every 12 hours 01/26/24 0658 01/30/24 1354   01/26/24 1000  fluconazole (DIFLUCAN) 40 MG/ML suspension 200 mg  Status:  Discontinued        200 mg Oral Daily 01/26/24 0658 01/31/24 0938   01/22/24 1445  fluconazole (DIFLUCAN) IVPB 400 mg  Status:  Discontinued        400 mg 100 mL/hr over 120 Minutes Intravenous Every 24 hours 01/22/24 1436 01/22/24 1439   01/22/24 1445  fluconazole (DIFLUCAN) IVPB 200 mg  Status:  Discontinued        200 mg 100 mL/hr over 60 Minutes Intravenous Every 24 hours 01/22/24 1439 01/26/24 0658   01/21/24 2200  piperacillin -tazobactam (ZOSYN ) IVPB 3.375 g  Status:  Discontinued       Placed in Followed by Linked Group   3.375 g 12.5 mL/hr over 240 Minutes Intravenous Every 8 hours 01/21/24 1607 01/26/24  0658   01/21/24 1615  piperacillin -tazobactam (ZOSYN ) IVPB 3.375 g       Placed in Followed by Linked Group   3.375 g 100 mL/hr over 30 Minutes Intravenous  Once 01/21/24 1607 01/21/24 1651      CBC: Recent Labs  Lab 02/25/24 0500 02/26/24 0500  WBC 11.1* 11.7*  HGB 11.9* 12.3*  HCT 38.0* 39.6  MCV 88.8 89.8  PLT 260 261   BMP &GFR Recent Labs  Lab 02/25/24 0500 02/26/24 0500 02/27/24 0521 02/29/24 0500 03/01/24 0413  NA 142 145  --  143 141  K 4.2 4.5  --  4.4 4.3  CL 105 109  --  113* 111  CO2 24 22  --  21* 20*  GLUCOSE 164* 153*  --  109* 138*  BUN 105* 83*  --  57* 48*  CREATININE 2.65* 2.26*  --  1.97* 1.83*  CALCIUM  8.9 8.9  --  8.7* 8.6*  MG 2.7* 2.8* 2.7*  --  2.4  PHOS 4.1 3.4  --   --   --    Liver & Pancreas: Recent Labs  Lab 02/26/24 0500  AST 15  ALT 18  ALKPHOS 71  BILITOT 0.5  PROT 6.3*  ALBUMIN  1.9*    Recent Labs  Lab 03/01/24 2002 03/01/24 2355 03/02/24 0333 03/02/24 0744 03/02/24 1133  GLUCAP 105* 112* 101* 133* 138*     Microbiology: No results found for this or any previous visit (from the past 240 hours).   Radiology Studies: No results found.  Time spent: 50 minutes.  Eric Nunnery 03/02/2024  If 7PM-7AM, please contact night-coverage [email or web address] 03/02/2024, 1:06 PM

## 2024-03-02 NOTE — Progress Notes (Signed)
 Nutrition Brief Note  RD received secure chat from patients RN with concern for patients ongoing diarrhea. Pt remains on enteral support of Vital 1.5 at 60 mL/hr. Pt with two documented stools this morning. Plan to adjust to formula to [facility] 1.4 and assess for better tolerance; see regimen below. Will discontinue Multivitamin w/ minerals for now to reduce medications needed per tube, pt will meet DRI for vitamins and minerals in enteral formula.    INTERVENTION:   Adjust TF via PEG: [place] Standard 1.4 to 65 ml/h (1560 ml per day) -Prosource TF20 60ml daily - FWF: 100 mL q6h (400 mL/day) -Provides 2264 kcal, 117 gm protein, 1508 ml free water  daily  Banatrol QID -provides 45kcal, 5g soluble fiber per serving Continue Florastor BID for re-establishing gut microbiome  Discontinue Multivitamin w/ minerals daily    Nestora Glatter RD, LDN Clinical Dietitian

## 2024-03-02 NOTE — TOC Progression Note (Signed)
 Transition of Care [facility]) - Progression Note    Patient Details  Name: Billy [name] MRN: [id number] Date of Birth: 05-07-1950  [facility]) CM/SW Contact  [name], [place] Phone Number: 03/02/2024, 10:23 AM  Clinical Narrative:   CSW spoke with [name] to discuss potential bed choice. [name] stated he is deciding between two facilities: [facility] in [place]. [name] stated he is going to schedule tours and is going to try to make sure the tours are on Monday.   CSW will continue to follow.    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, SNF Pending bed offer               Expected Discharge Plan and Services In-house Referral: Clinical Social Work Discharge Planning Services: [employer] Consult Post Acute Care Choice: Skilled Nursing Facility Living arrangements for the past 2 months: Single Family Home                                       Social Drivers of Health (SDOH) Interventions SDOH Screenings   Food Insecurity: No Food Insecurity (01/22/2024)  Housing: High Risk (01/22/2024)  Transportation Needs: No Transportation Needs (01/22/2024)  Utilities: Not At Risk (01/22/2024)  Depression (PHQ2-9): Low Risk  (12/01/2020)  Social Connections: Socially Isolated (01/22/2024)  Tobacco Use: Low Risk  (02/19/2024)    Readmission Risk Interventions    06/27/2023    1:52 PM 10/14/2022   11:29 AM  Readmission Risk Prevention Plan  Post Dischage Appt  Complete  Medication Screening  Complete  Transportation Screening Complete Complete  HRI or Home Care Consult Complete   Social Work Consult for Recovery Care Planning/Counseling Complete   Palliative Care Screening Not Applicable   Medication Review Oceanographer) Referral to Pharmacy

## 2024-03-02 NOTE — Plan of Care (Signed)
  Problem: Education: Goal: Knowledge of General Education information will improve Description: Including pain rating scale, medication(s)/side effects and non-pharmacologic comfort measures Outcome: Progressing   Problem: Health Behavior/Discharge Planning: Goal: Ability to manage health-related needs will improve Outcome: Progressing   Problem: Clinical Measurements: Goal: Ability to maintain clinical measurements within normal limits will improve Outcome: Progressing Goal: Will remain free from infection Outcome: Progressing Goal: Diagnostic test results will improve Outcome: Progressing Goal: Respiratory complications will improve Outcome: Progressing Goal: Cardiovascular complication will be avoided Outcome: Progressing   Problem: Activity: Goal: Risk for activity intolerance will decrease Outcome: Progressing   Problem: Nutrition: Goal: Adequate nutrition will be maintained Outcome: Progressing   Problem: Coping: Goal: Level of anxiety will decrease Outcome: Progressing   Problem: Elimination: Goal: Will not experience complications related to bowel motility Outcome: Progressing Goal: Will not experience complications related to urinary retention Outcome: Progressing   Problem: Pain Managment: Goal: General experience of comfort will improve and/or be controlled Outcome: Progressing   Problem: Safety: Goal: Ability to remain free from injury will improve Outcome: Progressing   Problem: Skin Integrity: Goal: Risk for impaired skin integrity will decrease Outcome: Progressing   Problem: Education: Goal: Ability to describe self-care measures that may prevent or decrease complications (Diabetes Survival Skills Education) will improve Outcome: Progressing   Problem: Coping: Goal: Ability to adjust to condition or change in health will improve Outcome: Progressing   Problem: Fluid Volume: Goal: Ability to maintain a balanced intake and output will  improve Outcome: Progressing   Problem: Health Behavior/Discharge Planning: Goal: Ability to identify and utilize available resources and services will improve Outcome: Progressing Goal: Ability to manage health-related needs will improve Outcome: Progressing   Problem: Metabolic: Goal: Ability to maintain appropriate glucose levels will improve Outcome: Progressing   Problem: Nutritional: Goal: Maintenance of adequate nutrition will improve Outcome: Progressing Goal: Progress toward achieving an optimal weight will improve Outcome: Progressing   Problem: Skin Integrity: Goal: Risk for impaired skin integrity will decrease Outcome: Progressing   Problem: Tissue Perfusion: Goal: Adequacy of tissue perfusion will improve Outcome: Progressing   Problem: Activity: Goal: Ability to tolerate increased activity will improve Outcome: Progressing   Problem: Respiratory: Goal: Ability to maintain a clear airway and adequate ventilation will improve Outcome: Progressing   Problem: Role Relationship: Goal: Method of communication will improve Outcome: Progressing   Problem: Education: Goal: Ability to describe self-care measures that may prevent or decrease complications (Diabetes Survival Skills Education) will improve Outcome: Progressing Goal: Individualized Educational Video(s) Outcome: Progressing   Problem: Coping: Goal: Ability to adjust to condition or change in health will improve Outcome: Progressing   Problem: Fluid Volume: Goal: Ability to maintain a balanced intake and output will improve Outcome: Progressing   Problem: Health Behavior/Discharge Planning: Goal: Ability to identify and utilize available resources and services will improve Outcome: Progressing Goal: Ability to manage health-related needs will improve Outcome: Progressing   Problem: Metabolic: Goal: Ability to maintain appropriate glucose levels will improve Outcome: Progressing   Problem:  Nutritional: Goal: Maintenance of adequate nutrition will improve Outcome: Progressing Goal: Progress toward achieving an optimal weight will improve Outcome: Progressing   Problem: Skin Integrity: Goal: Risk for impaired skin integrity will decrease Outcome: Progressing   Problem: Tissue Perfusion: Goal: Adequacy of tissue perfusion will improve Outcome: Progressing

## 2024-03-03 DIAGNOSIS — E43 Unspecified severe protein-calorie malnutrition: Secondary | ICD-10-CM | POA: Diagnosis not present

## 2024-03-03 DIAGNOSIS — I5023 Acute on chronic systolic (congestive) heart failure: Secondary | ICD-10-CM | POA: Diagnosis not present

## 2024-03-03 DIAGNOSIS — J90 Pleural effusion, not elsewhere classified: Secondary | ICD-10-CM | POA: Diagnosis not present

## 2024-03-03 DIAGNOSIS — K223 Perforation of esophagus: Secondary | ICD-10-CM | POA: Diagnosis not present

## 2024-03-03 LAB — GLUCOSE, CAPILLARY
Glucose-Capillary: 107 mg/dL — ABNORMAL HIGH (ref 70–99)
Glucose-Capillary: 114 mg/dL — ABNORMAL HIGH (ref 70–99)
Glucose-Capillary: 114 mg/dL — ABNORMAL HIGH (ref 70–99)
Glucose-Capillary: 124 mg/dL — ABNORMAL HIGH (ref 70–99)
Glucose-Capillary: 126 mg/dL — ABNORMAL HIGH (ref 70–99)
Glucose-Capillary: 137 mg/dL — ABNORMAL HIGH (ref 70–99)

## 2024-03-03 MED ORDER — SODIUM CHLORIDE (PF) 0.9 % IJ SOLN
INTRAMUSCULAR | Status: AC
  Administered 2024-03-03: 10 mL
  Filled 2024-03-03: qty 10

## 2024-03-03 MED ORDER — TRAZODONE HCL 50 MG PO TABS
50.0000 mg | ORAL_TABLET | Freq: Every evening | ORAL | Status: DC | PRN
  Administered 2024-03-03 – 2024-04-27 (×19): 50 mg
  Filled 2024-03-03 (×18): qty 1

## 2024-03-03 NOTE — Progress Notes (Signed)
 PROGRESS NOTE  Billy [name] FMW:[id number] DOB: Nov 09, 1950   PCP: Seabron Lenis, MD  Patient is from: Home.  DOA: 01/21/2024 LOS: 42   Brief Narrative / Interim history: Patient is a 73 year old male past medical history significant for cardiac arrest earlier this year, HFrEF, Afib and PE.  Patient presented with several days of nausea and vomiting from a stomach bug followed by sudden onset severe tearing epigastric pain radiating to back and chest. Workup revealed esophageal tear/Boerhaave. Patient had esophageal stent placed on 01/23/2024.  Subsequent swallow study revealed an additional leak, and he had stent repositioned on 01/31/2024.  He became hypoxic with gastric contents noted on intubation, and he was transferred to ICU for mechanical ventilation.  Bedside ultrasound with right pleural effusion for which he had chest tube placed with removal of 300 cc exudative fluid.  He was also successfully extubated on 10/16.  Patient had increased oxygen  requirement requiring reintubation on 02/05/2024.  Bedside ultrasound showed left-sided pleural effusion and he had left-sided chest tube with 120 cc exudative output.  ICU stay was complicated by high O2 requirement, left sided pleural effusion, chest tube placement 10/16, reintubated 10/20.  Patient was eventually extubated, and transferred out of ICU on 02/13/2024.    Hospital course has been complicated by acute on chronic HFpEF for which he is on IV Lasix  per advanced heart failure team. -PEG tube placed on 11/3.  Dietitian consulted for initiation of tube feed.  Remains on TPN until tube feed at goal.  Subjective: Weak and deconditioned; no fever, no chest pain, no nausea, no vomiting.  Overall tolerating PEG tube.  Assessment and plan:  Esophageal rupture/ Boerhaave syndrome Likely from excessive vomiting -S/p stent placement on 10/7 -S/p stent repositioning on 10/16 due to leakage -Esophagogram 10/24 shows persistent leak -PEG tube  placed on 11/3.  Currently tolerating tube feed okay, moving towards goal -Continue TPN until TF at goal Both chest tubes are out, currently on IV Zosyn , per discussion with CTS plan is for continuation of antibiotics until esophageal stent removed.  Acute hypoxic/ hypercapnic respiratory failure- Multifactorial including aspiration pneumonia, pleural effusion and CHF.  Trach aspirate on 10/15 with Candida albicans. -IV meropenem>> IV Zosyn  for pneumonia per CTS. -Diflucan for Candida albicans from trach aspirate from 10/15-11/4. -Continue bronchodilators Respiratory status is stable   Acute on chronic diastolic CHF -Limited TTE on 10/23 with LVEF of 60 to 65%.  RHC in 3/25 with mild PAH with severe RV failure but preserved CO.  Appears euvolemic on exam. -Heart failure team has been following.  Patient was started on diuretics and Entresto  however these have been discontinued now due to elevation in creatinine. -Patient now on hydralazine  and isosorbide . -When able to tolerate by mouth can use Imdur .   AKI on CKD-3B:  Baseline Cr~2.0.   Renal function worsened with diuretics and Entresto  both of which were held.  Now improving. Monitor urine output.  Avoid nephrotoxic agents.   -Last creatinine level 1.83. - Overall stable and at baseline currently.. - Continue to maintain adequate hydration and continue to follow electrolytes and renal function intermittently.   Paroxysmal A-fib:  s/p TEE DCCV 3/25.  Seems to be in sinus rhythm. -Continue telemetry monitoring - Continue amiodarone  and Eliquis .   Intractable pain -Required ketamine and started on IV Dilaudid  PCA while in ICU. -Off Dilaudid  pump 11/12, currently on p.o. oxy as well as IV Dilaudid  pushes as needed -Placed fentanyl  patch 11/10 night, will discontinue. - Patient reports uncontrolled pain control.  H/o PE/ DVT-  S/p infrarenal IVC placement 3/25. V/Q 3/25 with multiple perfusion defects -Continue  Eliquis .  Essential hypertension:  Blood pressure is reasonably well-controlled.  Patient is currently on hydralazine .  Nausea and diarrhea Patient with persistent nausea and diarrhea.  Medication changes were made.  He is now on Phenergan and Lomotil as needed.   -Fiber - Continue to follow ultralights and replete as needed. - Continue the use of Florastor  Unwitnessed fall in the hospital:  Found down on the floor the evening of 10/31.   -No apparent injury or focal neurodeficit. -Fall precaution  Anxiety/depression disorder:  -Stable. -Continue Ativan  as needed -Overall stable mood with flat affect.   Acute on chronic anemia Hemoglobin has been stable.  No evidence of overt blood loss -Continue to follow hemoglobin trend.   Insomnia  - As needed trazodone  has been added to assist patient with his sleeping needs.  GERD - Continue PPI. -Physical deconditioning: -Continue PT/OT assessment/evaluation.  Severe protein calorie malnutrition/hypoalbuminemia Body mass index is 21.81 kg/m. Nutrition Problem: Severe Malnutrition Etiology: acute illness (may have chronic component due to time frame of reported wt loss) Signs/Symptoms: severe fat depletion, moderate muscle depletion Interventions: Refer to RD note for recommendations -Continue tube feedings - TPN has been discontinued and patient otherwise stable.  Pressure skin injury Wound 02/07/24 0900 Pressure Injury Foot Anterior;Right Stage 1 -  Intact skin with non-blanchable redness of a localized area usually over a bony prominence. (Active)     Wound 02/07/24 0900 Pressure Injury Foot Anterior;Left Stage 1 -  Intact skin with non-blanchable redness of a localized area usually over a bony prominence. (Active)     Wound 02/07/24 0900 Pressure Injury Buttocks Right Stage 1 -  Intact skin with non-blanchable redness of a localized area usually over a bony prominence. (Active)   DVT prophylaxis: On Eliquis  now Code  Status: Full code Family Communication: None at bedside today. Disposition:  SNF when medically stable  Consultants:  Gastroenterology Critical care Cardiology Cardiothoracic surgery  Microbiology summarized: 10/5-MRSA PCR screen positive 10/15-trach aspirate culture with Candida albicans.  Objective: Vitals:   03/03/24 0400 03/03/24 0500 03/03/24 0738 03/03/24 1217  BP: (!) 161/63  (!) 152/61 (!) 140/57  Pulse:      Resp:    20  Temp: 98.1 F (36.7 C)  98.1 F (36.7 C) 97.9 F (36.6 C)  TempSrc: Oral  Oral Oral  SpO2:      Weight:  75 kg    Height:        Examination: General exam: Alert, awake, oriented x 3; afebrile, no chest pain, no nausea, no vomiting. Respiratory system: Good saturation on room air.  No using accessory muscles. Cardiovascular system: Rate controlled, no rubs, no gallops, no JVD. Gastrointestinal system: Abdomen is nondistended, soft and nontender.  Positive bowel sounds appreciated on exam.  PEG tube in place. Central nervous system: Reporting feeling generally weak; moving 4 limbs spontaneously.  No focal neurological deficits. Extremities: No cyanosis or clubbing. Skin: No petechiae. Psychiatry: Judgement and insight appear normal.  Flat affect appreciated on exam.   Sch Meds:  Scheduled Meds:  amiodarone   200 mg Per Tube BID   apixaban   5 mg Per Tube BID   Chlorhexidine  Gluconate Cloth  6 each Topical Daily   feeding supplement (PROSource TF20)  60 mL Per Tube Daily   fiber supplement (BANATROL TF)  60 mL Per Tube QID   free water   100 mL Per Tube Q6H  [name] butt cream   Topical BID   hydrALAZINE   100 mg Per Tube Q8H   insulin  aspart  0-9 Units Subcutaneous Q4H   isosorbide  dinitrate  10 mg Per Tube BID   lidocaine   1 patch Transdermal Q24H   losartan  50 mg Per Tube Daily   pantoprazole  (PROTONIX ) IV  40 mg Intravenous Q12H   saccharomyces boulardii  250 mg Per Tube BID   sodium chloride  flush  10-40 mL Intracatheter Q12H    Continuous Infusions:  feeding supplement ([place] STANDARD ENT 1.4) 1,000 mL (03/03/24 9365)   piperacillin -tazobactam (ZOSYN )  IV 3.375 g (03/03/24 0618)   promethazine (PHENERGAN) injection (IM or IVPB) 150 mL/hr at 03/01/24 0348   PRN Meds:.acetaminophen  (TYLENOL ) oral liquid 160 mg/5 mL, alum & mag hydroxide-simeth, diclofenac Sodium, diphenhydrAMINE **OR** diphenhydrAMINE, [COMPLETED] diphenoxylate-atropine **FOLLOWED BY** diphenoxylate-atropine, HYDROmorphone  (DILAUDID ) injection, levalbuterol, lip balm, metoCLOPramide (REGLAN) injection, naloxone  **AND** sodium chloride  flush, mouth rinse, oxyCODONE , promethazine (PHENERGAN) injection (IM or IVPB), simethicone , sodium chloride , sodium chloride  flush, traZODone   Antimicrobials: Anti-infectives (From admission, onward)    Start     Dose/Rate Route Frequency Ordered Stop   02/22/24 1844  fluconazole (DIFLUCAN) tablet 200 mg        200 mg Per Tube Daily 02/22/24 1844 03/03/24 1024   02/22/24 1630  fluconazole (DIFLUCAN) tablet 200 mg  Status:  Discontinued        200 mg Oral Daily 02/22/24 1537 02/22/24 1844   02/13/24 0900  piperacillin -tazobactam (ZOSYN ) IVPB 3.375 g        3.375 g 12.5 mL/hr over 240 Minutes Intravenous Every 8 hours 02/13/24 0812     02/07/24 0800  vancomycin (VANCOCIN) IVPB 1000 mg/200 mL premix  Status:  Discontinued        1,000 mg 200 mL/hr over 60 Minutes Intravenous Every 24 hours 02/07/24 0632 02/09/24 0905   02/06/24 2200  meropenem (MERREM) 1 g in sodium chloride  0.9 % 100 mL IVPB  Status:  Discontinued        1 g 200 mL/hr over 30 Minutes Intravenous Every 12 hours 02/06/24 1127 02/13/24 0812   02/06/24 1300  vancomycin (VANCOREADY) IVPB 1750 mg/350 mL  Status:  Discontinued        1,750 mg 175 mL/hr over 120 Minutes Intravenous Every 48 hours 02/04/24 1225 02/04/24 1226   02/06/24 0800  vancomycin (VANCOCIN) IVPB 1000 mg/200 mL premix        1,000 mg 200 mL/hr over 60 Minutes Intravenous  Once  02/06/24 0629 02/07/24 0829   02/04/24 1400  meropenem (MERREM) 1 g in sodium chloride  0.9 % 100 mL IVPB  Status:  Discontinued        1 g 200 mL/hr over 30 Minutes Intravenous Every 8 hours 02/04/24 1208 02/06/24 1127   02/04/24 1300  linezolid  (ZYVOX ) IVPB 600 mg  Status:  Discontinued        600 mg 300 mL/hr over 60 Minutes Intravenous Every 12 hours 02/04/24 1202 02/04/24 1208   02/04/24 1300  vancomycin (VANCOREADY) IVPB 2000 mg/400 mL        2,000 mg 200 mL/hr over 120 Minutes Intravenous  Once 02/04/24 1208 02/04/24 1623   02/04/24 1208  vancomycin variable dose per unstable renal function (pharmacist dosing)  Status:  Discontinued         Does not apply See admin instructions 02/04/24 1208 02/07/24 0632   01/31/24 1030  fluconazole (DIFLUCAN) IVPB 200 mg  Status:  Discontinued  200 mg 100 mL/hr over 60 Minutes Intravenous Daily 01/31/24 0938 02/20/24 1218   01/30/24 1445  piperacillin -tazobactam (ZOSYN ) IVPB 3.375 g  Status:  Discontinued        3.375 g 12.5 mL/hr over 240 Minutes Intravenous Every 8 hours 01/30/24 1354 02/04/24 1203   01/26/24 1000  amoxicillin-clavulanate (AUGMENTIN) 400-57 MG/5ML suspension 875 mg  Status:  Discontinued        875 mg Oral Every 12 hours 01/26/24 0658 01/30/24 1354   01/26/24 1000  fluconazole (DIFLUCAN) 40 MG/ML suspension 200 mg  Status:  Discontinued        200 mg Oral Daily 01/26/24 0658 01/31/24 0938   01/22/24 1445  fluconazole (DIFLUCAN) IVPB 400 mg  Status:  Discontinued        400 mg 100 mL/hr over 120 Minutes Intravenous Every 24 hours 01/22/24 1436 01/22/24 1439   01/22/24 1445  fluconazole (DIFLUCAN) IVPB 200 mg  Status:  Discontinued        200 mg 100 mL/hr over 60 Minutes Intravenous Every 24 hours 01/22/24 1439 01/26/24 0658   01/21/24 2200  piperacillin -tazobactam (ZOSYN ) IVPB 3.375 g  Status:  Discontinued       Placed in Followed by Linked Group   3.375 g 12.5 mL/hr over 240 Minutes Intravenous Every 8 hours 01/21/24  1607 01/26/24 0658   01/21/24 1615  piperacillin -tazobactam (ZOSYN ) IVPB 3.375 g       Placed in Followed by Linked Group   3.375 g 100 mL/hr over 30 Minutes Intravenous  Once 01/21/24 1607 01/21/24 1651      CBC: Recent Labs  Lab 02/26/24 0500  WBC 11.7*  HGB 12.3*  HCT 39.6  MCV 89.8  PLT 261   BMP &GFR Recent Labs  Lab 02/26/24 0500 02/27/24 0521 02/29/24 0500 03/01/24 0413  NA 145  --  143 141  K 4.5  --  4.4 4.3  CL 109  --  113* 111  CO2 22  --  21* 20*  GLUCOSE 153*  --  109* 138*  BUN 83*  --  57* 48*  CREATININE 2.26*  --  1.97* 1.83*  CALCIUM  8.9  --  8.7* 8.6*  MG 2.8* 2.7*  --  2.4  PHOS 3.4  --   --   --    Liver & Pancreas: Recent Labs  Lab 02/26/24 0500  AST 15  ALT 18  ALKPHOS 71  BILITOT 0.5  PROT 6.3*  ALBUMIN  1.9*    Recent Labs  Lab 03/02/24 2044 03/02/24 2334 03/03/24 0355 03/03/24 0842 03/03/24 1218  GLUCAP 113* 119* 107* 137* 126*     Microbiology: No results found for this or any previous visit (from the past 240 hours).   Radiology Studies: No results found.  Time spent: 50 minutes.  Eric Nunnery 03/03/2024  If 7PM-7AM, please contact night-coverage [email or web address] 03/03/2024, 12:49 PM

## 2024-03-03 NOTE — Plan of Care (Signed)
  Problem: Education: Goal: Knowledge of General Education information will improve Description: Including pain rating scale, medication(s)/side effects and non-pharmacologic comfort measures Outcome: Progressing   Problem: Health Behavior/Discharge Planning: Goal: Ability to manage health-related needs will improve Outcome: Progressing   Problem: Clinical Measurements: Goal: Ability to maintain clinical measurements within normal limits will improve Outcome: Progressing Goal: Will remain free from infection Outcome: Progressing Goal: Diagnostic test results will improve Outcome: Progressing Goal: Respiratory complications will improve Outcome: Progressing Goal: Cardiovascular complication will be avoided Outcome: Progressing   Problem: Activity: Goal: Risk for activity intolerance will decrease Outcome: Progressing   Problem: Nutrition: Goal: Adequate nutrition will be maintained Outcome: Progressing   Problem: Coping: Goal: Level of anxiety will decrease Outcome: Progressing   Problem: Elimination: Goal: Will not experience complications related to bowel motility Outcome: Progressing Goal: Will not experience complications related to urinary retention Outcome: Progressing   Problem: Pain Managment: Goal: General experience of comfort will improve and/or be controlled Outcome: Progressing   Problem: Safety: Goal: Ability to remain free from injury will improve Outcome: Progressing   Problem: Skin Integrity: Goal: Risk for impaired skin integrity will decrease Outcome: Progressing   Problem: Education: Goal: Ability to describe self-care measures that may prevent or decrease complications (Diabetes Survival Skills Education) will improve Outcome: Progressing   Problem: Coping: Goal: Ability to adjust to condition or change in health will improve Outcome: Progressing   Problem: Fluid Volume: Goal: Ability to maintain a balanced intake and output will  improve Outcome: Progressing   Problem: Health Behavior/Discharge Planning: Goal: Ability to identify and utilize available resources and services will improve Outcome: Progressing Goal: Ability to manage health-related needs will improve Outcome: Progressing   Problem: Metabolic: Goal: Ability to maintain appropriate glucose levels will improve Outcome: Progressing   Problem: Nutritional: Goal: Maintenance of adequate nutrition will improve Outcome: Progressing Goal: Progress toward achieving an optimal weight will improve Outcome: Progressing   Problem: Skin Integrity: Goal: Risk for impaired skin integrity will decrease Outcome: Progressing   Problem: Tissue Perfusion: Goal: Adequacy of tissue perfusion will improve Outcome: Progressing   Problem: Activity: Goal: Ability to tolerate increased activity will improve Outcome: Progressing   Problem: Respiratory: Goal: Ability to maintain a clear airway and adequate ventilation will improve Outcome: Progressing   Problem: Role Relationship: Goal: Method of communication will improve Outcome: Progressing   Problem: Education: Goal: Ability to describe self-care measures that may prevent or decrease complications (Diabetes Survival Skills Education) will improve Outcome: Progressing Goal: Individualized Educational Video(s) Outcome: Progressing   Problem: Coping: Goal: Ability to adjust to condition or change in health will improve Outcome: Progressing   Problem: Fluid Volume: Goal: Ability to maintain a balanced intake and output will improve Outcome: Progressing   Problem: Health Behavior/Discharge Planning: Goal: Ability to identify and utilize available resources and services will improve Outcome: Progressing Goal: Ability to manage health-related needs will improve Outcome: Progressing   Problem: Metabolic: Goal: Ability to maintain appropriate glucose levels will improve Outcome: Progressing   Problem:  Nutritional: Goal: Maintenance of adequate nutrition will improve Outcome: Progressing Goal: Progress toward achieving an optimal weight will improve Outcome: Progressing   Problem: Skin Integrity: Goal: Risk for impaired skin integrity will decrease Outcome: Progressing   Problem: Tissue Perfusion: Goal: Adequacy of tissue perfusion will improve Outcome: Progressing

## 2024-03-03 NOTE — TOC Progression Note (Signed)
 Transition of Care [facility]) - Progression Note    Patient Details  Name: Billy [name] MRN: [id number] Date of Birth: [birth year]  [facility]) CM/SW Contact  Isaiah Public, LCSWA Phone Number: 03/03/2024, 2:07 PM  Clinical Narrative:     CSW to follow up with patients son tomorrow on SNF choice once able to tour facility's. CSW LVM with [facility] . CSW awaiting call back from [facility] to start insurance authorization for patient. CSW updated MD.  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, SNF Pending bed offer               Expected Discharge Plan and Services In-house Referral: Clinical Social Work Discharge Planning Services: [employer] Consult Post Acute Care Choice: Skilled Nursing Facility Living arrangements for the past 2 months: Single Family Home                                       Social Drivers of Health (SDOH) Interventions SDOH Screenings   Food Insecurity: No Food Insecurity (01/22/2024)  Housing: High Risk (01/22/2024)  Transportation Needs: No Transportation Needs (01/22/2024)  Utilities: Not At Risk (01/22/2024)  Depression (PHQ2-9): Low Risk  (12/01/2020)  Social Connections: Socially Isolated (01/22/2024)  Tobacco Use: Low Risk  (02/19/2024)    Readmission Risk Interventions    06/27/2023    1:52 PM 10/14/2022   11:29 AM  Readmission Risk Prevention Plan  Post Dischage Appt  Complete  Medication Screening  Complete  Transportation Screening Complete Complete  HRI or Home Care Consult Complete   Social Work Consult for Recovery Care Planning/Counseling Complete   Palliative Care Screening Not Applicable   Medication Review Oceanographer) Referral to Pharmacy

## 2024-03-04 DIAGNOSIS — K223 Perforation of esophagus: Secondary | ICD-10-CM | POA: Diagnosis not present

## 2024-03-04 LAB — BASIC METABOLIC PANEL WITH GFR
Anion gap: 9 (ref 5–15)
BUN: 38 mg/dL — ABNORMAL HIGH (ref 8–23)
CO2: 20 mmol/L — ABNORMAL LOW (ref 22–32)
Calcium: 8.3 mg/dL — ABNORMAL LOW (ref 8.9–10.3)
Chloride: 111 mmol/L (ref 98–111)
Creatinine, Ser: 1.69 mg/dL — ABNORMAL HIGH (ref 0.61–1.24)
GFR, Estimated: 43 mL/min — ABNORMAL LOW (ref >60)
Glucose, Bld: 117 mg/dL — ABNORMAL HIGH (ref 70–99)
Potassium: 4.6 mmol/L (ref 3.5–5.1)
Sodium: 140 mmol/L (ref 135–145)

## 2024-03-04 LAB — GLUCOSE, CAPILLARY
Glucose-Capillary: 123 mg/dL — ABNORMAL HIGH (ref 70–99)
Glucose-Capillary: 108 mg/dL — ABNORMAL HIGH (ref 70–99)
Glucose-Capillary: 118 mg/dL — ABNORMAL HIGH (ref 70–99)
Glucose-Capillary: 108 mg/dL — ABNORMAL HIGH (ref 70–99)
Glucose-Capillary: 129 mg/dL — ABNORMAL HIGH (ref 70–99)

## 2024-03-04 LAB — CBC
HCT: 32.2 % — ABNORMAL LOW (ref 39.0–52.0)
Hemoglobin: 10.1 g/dL — ABNORMAL LOW (ref 13.0–17.0)
MCH: 28.4 pg (ref 26.0–34.0)
MCHC: 31.4 g/dL (ref 30.0–36.0)
MCV: 90.4 fL (ref 80.0–100.0)
Platelets: 215 K/uL (ref 150–400)
RBC: 3.56 MIL/uL — ABNORMAL LOW (ref 4.22–5.81)
RDW: 18.8 % — ABNORMAL HIGH (ref 11.5–15.5)
WBC: 14.2 K/uL — ABNORMAL HIGH (ref 4.0–10.5)
nRBC: 0 % (ref 0.0–0.2)

## 2024-03-04 LAB — MAGNESIUM: Magnesium: 2.3 mg/dL (ref 1.7–2.4)

## 2024-03-04 MED ORDER — SODIUM CHLORIDE (PF) 0.9 % IJ SOLN
INTRAMUSCULAR | Status: AC
  Administered 2024-03-04: 10 mL
  Filled 2024-03-04: qty 10

## 2024-03-04 MED ORDER — GUAIFENESIN 100 MG/5ML PO LIQD: 5.0000 mL | ORAL | Status: DC | PRN

## 2024-03-04 MED ORDER — HYDRALAZINE HCL 20 MG/ML IJ SOLN
10.0000 mg | INTRAMUSCULAR | Status: DC | PRN
  Administered 2024-03-15 – 2024-03-16 (×5): 10 mg via INTRAVENOUS
  Filled 2024-03-04 (×5): qty 1

## 2024-03-04 MED ORDER — METOPROLOL TARTRATE 5 MG/5ML IV SOLN: 5.0000 mg | INTRAVENOUS | Status: DC | PRN

## 2024-03-04 MED ORDER — GLUCAGON HCL RDNA (DIAGNOSTIC) 1 MG IJ SOLR: 1.0000 mg | INTRAMUSCULAR | Status: DC | PRN

## 2024-03-04 NOTE — TOC Progression Note (Signed)
 Transition of Care [facility]) - Progression Note    Patient Details  Name: Billy [name] MRN: [id number] Date of Birth: Aug 01, 1950  Transition of Care [facility]) CM/SW Contact  Lauraine FORBES Saa, LCSWA Phone Number: 03/04/2024, 12:02 PM  Clinical Narrative:     12:02 PM Per progressions, patient is to receive procedure today and is not medically ready for discharge. Patient's son, [name], informed CSW that he is still deciding between [facility] and [facility] and is to tour SNFs tomorrow. CSW to submit SNF insurance authorization request once SNF decision has been made. CSW will continue to follow.   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, SNF Pending bed offer               Expected Discharge Plan and Services In-house Referral: Clinical Social Work Discharge Planning Services: [employer] Consult Post Acute Care Choice: Skilled Nursing Facility Living arrangements for the past 2 months: Single Family Home                                       Social Drivers of Health (SDOH) Interventions SDOH Screenings   Food Insecurity: No Food Insecurity (01/22/2024)  Housing: High Risk (01/22/2024)  Transportation Needs: No Transportation Needs (01/22/2024)  Utilities: Not At Risk (01/22/2024)  Depression (PHQ2-9): Low Risk  (12/01/2020)  Social Connections: Socially Isolated (01/22/2024)  Tobacco Use: Low Risk  (02/19/2024)    Readmission Risk Interventions    06/27/2023    1:52 PM 10/14/2022   11:29 AM  Readmission Risk Prevention Plan  Post Dischage Appt  Complete  Medication Screening  Complete  Transportation Screening Complete Complete  HRI or Home Care Consult Complete   Social Work Consult for Recovery Care Planning/Counseling Complete   Palliative Care Screening Not Applicable   Medication Review Oceanographer) Referral to Pharmacy

## 2024-03-04 NOTE — Progress Notes (Signed)
 Nutrition Follow-up  DOCUMENTATION CODES:   Severe malnutrition in context of acute illness/injury  INTERVENTION:  Continue TF via PEG: [place] Standard 1.4 to 65 ml/h (1560 ml per day) -Prosource TF20 60ml daily - FWF: 100 mL q6h (400 mL/day) -Provides 2264 kcal, 117 gm protein, 1508 ml free water  daily  Banatrol QID -provides 45kcal, 5g soluble fiber per serving Continue Florastor BID for re-establishing gut microbiome  Monitor for ability to increase rate and decrease duration to allow for time off of the feeding pump - would like to see 1-2 more days of tolerance before progressing   NUTRITION DIAGNOSIS:  Severe Malnutrition related to acute illness (may have chronic component due to time frame of reported wt loss) as evidenced by severe fat depletion, moderate muscle depletion.  GOAL:  Patient will meet greater than or equal to 90% of their needs  MONITOR:  Diet advancement, Labs, Weight trends, I & O's, Skin (TPN)  REASON FOR ASSESSMENT:  Consult Enteral/tube feeding initiation and management  ASSESSMENT:   73 yo male admitted post several days of N/V followed by sudden onset of severe epigastric pain radiating to chest and back. Pt found to have small esophageal perforation above GE junction. PMH includes CAD, chronic HFpEF, HTN, HLD, DVT, CVA, PAF, CKD 3b.  10/05 Admitted 10/06 Esophagogram/barium study: distal esophageal perforation, TPN started 10/07 S/p Esophageal stent, TPN at goal rate of 80 ml/hr 10/08  CLD 10/10 Diet advanced to Dysphagia 3, Calorie Count 10/13 No meal tickets for calorie count, pt reports eating bites only 10/15 Esophageal stent migration with leak on esophagram with return to OR for stent repositioning, likely aspiration event requiring intubation 10/16 Extubated, Chest tube placed 10/17 Limited echo with EF 50-55%, RV mildly reduced 10/20 Intubated early AM, L pleural effusion, pigtail chest tube inserted 10/23 - extubated; limited  echo: 60-65% 10/24 - esophagram: persistent leak 10/28 - transfer out of ICU 11/03 - PEG tube placement  11/04 - TF started 11/5 - TF at 20 ml/hr 11/6 - TPN decreased to meet 50% of pt's needs, TF at 50 ml/hr,  11/7 - TF at 60 ml/hr, multiple loose BM, decrease rate of tube feeds to 45 ml/hr, continue TPN to meet 50% of needs 11/10 - increase TF to 69ml/hr; TPN meet 50% of needs 11/11 - increase TF to goal rate (modified to Vital 1.5 as Mallie Pinion out of stock); d/c TPN tomorrow 11/12 - modify to Vital 1.5 @60ml /hr (goal rate) 11/15 - increased stool output; modified back to KF1.4 @65ml /hr (goal rate)   He is scheduled for esophageal stent removal some time this week or beginning of next week. Remains on IV ABX until then. Both chest tubes are out and off of Dilaudid  PCA. Remains on continuous tube feeds.   RN reached out over the weekend regarding increased stool output with Vital 1.5. Modified back to [employer] 1.4. RN and patient both report patient seems to be tolerating better this morning. Continues Banatrol to QID and Lomitil ordered PRN. Only one BM documented yesterday. One thus far today. No significant N/V or abdominal pain endorsed either.   Discussed next step with patient, if he continues to tolerate goal rate feedings. Will increase rate and decrease duration in an effort to modify to intermittent bolus feedings that mimic a traditional meal schedule to allow gut to rest and encourage absorption.    Admit Weight: 72.8 kg Current Weight: 75.3 kg Lowest Weight: 66.8 kg on 11/06   Some generalized edema  noted, however not significant. Bowels stabilizing. Weight trending upward.  Unable to trend corrected calcium  as no recent albumin  draw. BUN/Crt stable and have trended down since last assessment. Remains off diuretics.   Drains/Lines: R brachial: PICC, triple lumen LUQ: PEG (24Fr) placed 11/03   Labs from 11/10 reviewd: Sodium 140 (wdl) Potassium 4.6 (wdl) BUN  38 Creatinine 1.69 (H) Magnesium  2.3 (H) WBC 14.2(H) CBGs 117-138 x24 hours A1c 6.0 (01/2024)   Meds: SS Novolog  Pantoprazole  Florastor IV ABX    Diet Order:   Diet Order             Diet NPO time specified  Diet effective now                   EDUCATION NEEDS:   Education needs have been addressed  Skin:  Skin Assessment: Skin Integrity Issues: Skin Integrity Issues:: Stage I Stage I: buttocks, bilateral feet  Last BM:  11/17 - type6 x2 over last 24 hours  Height:  Ht Readings from Last 1 Encounters:  02/07/24 6' 1 (1.854 m)   Weight:  Wt Readings from Last 1 Encounters:  03/04/24 75.3 kg    Ideal Body Weight:  83.6 kg  BMI:  Body mass index is 21.9 kg/m.  Estimated Nutritional Needs:   Kcal:  2300-2500 kcals  Protein:  110-130 g  Fluid:  2L  Blair Deaner MS, RD, LDN Registered Dietitian Clinical Nutrition RD Inpatient Contact Info in Amion

## 2024-03-04 NOTE — Hospital Course (Addendum)
 73 year old male Prolonged admission 3/10-07/20/18/2025 cardiac arrest requiring ICU management cardioverted for A-fib-found to have DVTs then had hematochezia necessitating IVC filter-recurrent GI bleed secondary to erosive gastropathy and friable duodenal mucosa and colonoscopy was deferred and then chest tube placements cardiac cath at that time showed CAD with CTO of RCA and was treated with diuretics and sent home on torsemide  Entresto  Jardiance -   Readmitted 10/5 with nausea vomiting sudden tearing sensation found to have an esophageal tear  10/7 esophageal stent placed by cardiothoracic 10/15 stent reposition 10/16 extubated 10/20 reintubated with ultrasound showing left-sided pleural effusion-left-sided chest tube placed-by lights criteria exudative effusion 10/23 echo 60-65% 10/24 barium swallow showed persistent leak 10/28 transferred out of ICU 11/3 PEG tube placed 11/19 swelling of right arm showing on ultrasound SVT cephalic vein 11/22 chest x-ray?  Pneumonia done because of back pain-follow-up CT scan shows stent in place no mediastinal fluid collection small bilateral pleural effusions with bibasilar atelectasis pneumonia not excluded-scattered clusters nodular densities predominantly right upper and right lower lobes with nodule 11 mm as previously seen?  Mets?  Infection?  Aspiration 11/26 stent removed. Esophagram negative for esophageal leak.  11/27 getting diuresis again 11/28 duodenitis/enteritis.  GI consulted. Bloody emesis added carafate   11/29 trialing clears 12/1 NV again w/ hematemesis. Lasix  held ---  esophagram performed showing patent esophagus without perforation irregularity mid esophagus?  Stent diffuse narrowing lumen distal duodenum and proximal jejunum 12/2 EGD ischemic ulcers. Impression:  - Congested, inflamed, nodular, texture changed  mucosa in the esophagus. - Gastritis, Non-bleeding duodenal ulcers with a clean ulcer  base (Forrest Class III).-  Non-bleeding jejunal ulcers with a clean ulcer   base (Forrest Class III)  - Findings suggestive of ischemic ulcers. Increased carafate  and cont BID PPI  12/3 full liq diet  12/4 marked increased WOB. Acute and progressive w/ increased hypoxia and HTN. PCCM asked to see  12/10 again increased WOB, stubborn to diuresis, unable to oxygenate, to ICU--- azotemia and renal consulted 12/11 Intubated  12/12 extubated 12/13 c/o of epigastric pain but suspect more anxiety component, on cleviprex , CRRT for volume removal 12/15 pain improved, on RA, on clev and CRRT, renal bx pending 12/16 renal biopsy performed showing 12/17 off clev transition to iHD, transfer to floor  12/18: TDC placed 12/19 [name] assumed care, has distended and painful abdomen today, KUB shows improvement so tube feeds resumed, Eliquis  resumed 12/22 hemoccult neg---transfuse 1 U--- Eliquis  had to be held in the setting 12/23 vascular consulted secondary to lower extremities--- sleepy this morning-x-ray shows congestion no pneumonia white count up-- 12/24 ABIs showed abnormal right and left toe brachial indices-no intervention from vascular required 12/26 severe respiratory distress needing nonrebreather

## 2024-03-04 NOTE — Progress Notes (Signed)
 Physical Therapy Treatment Patient Details Name: Billy [name] MRN: [id number] DOB: Jun 01, 1950 Today's Date: 03/04/2024   History of Present Illness Billy [name] is a 73 y.o. male who presented to [facility] ED 01/21/24 with several days of vomiting followed by an episode of severe tearing epigastric pain. Work-up revealed esophageal tear. Pt s/p esophageal stent with EGD 10/7. Return to OR for esophageal stent adjustment on 10/15, remained intubated after procedure due to suspected aspiration. Extubated 10/16.  Pt had CT placed 10/16. Reintubated 10/20-10/24. G tube placed 11/3. He accidentally pulled out the R chest tube on 11/7. MD plan for  PMHx: HFrEF, AFib, CAD, PE complicated by cardiogenic shock, CKD 4, MI, peripheral neuropathy, and TIA. L chest tube removed 11/11    PT Comments  Pt received in supine, agreeable to work with SPTA. Pt able to ambulate in hall min A+2 with chair follow and line mgmt, needing a seated rest break. DOE 2/4. Pt requires verbal cues for sequencing, way finding, and safety with gait. Pt c/o stiffness in neck making it difficult for him to improve posture with gait and his ability to have environmental awareness. Pt is making improvements and would benefit from continued physical therapy to improve endurance, safety, and strength. Patient will benefit from continued inpatient follow up therapy, <3 hours/day    If plan is discharge home, recommend the following: A little help with walking and/or transfers;A little help with bathing/dressing/bathroom;Assistance with cooking/housework;Direct supervision/assist for medications management;Assist for transportation;Help with stairs or ramp for entrance   Can travel by private vehicle     Yes  Equipment Recommendations  Wheelchair (measurements PT);Wheelchair cushion (measurements PT);Rolling walker (2 wheels)    Recommendations for Other Services Rehab consult     Precautions / Restrictions Precautions Precautions: Fall Recall  of Precautions/Restrictions: Impaired Precaution/Restrictions Comments: G-tube Restrictions Weight Bearing Restrictions Per Provider Order: No     Mobility  Bed Mobility Overal bed mobility: Needs Assistance Bed Mobility: Supine to Sit     Supine to sit: HOB elevated, Contact guard, +2 for safety/equipment     General bed mobility comments: Pt able to get EOB with CGA needing assistance with lines.    Transfers Overall transfer level: Needs assistance Equipment used: Rolling walker (2 wheels) Transfers: Sit to/from Stand, Bed to chair/wheelchair/BSC Sit to Stand: Min assist, +2 safety/equipment, +2 physical assistance   Step pivot transfers: Contact guard assist, +2 safety/equipment       General transfer comment: Pt requires cues for hand placement when standing from EOB with walker    Ambulation/Gait Ambulation/Gait assistance: +2 safety/equipment, Min assist Gait Distance (Feet): 100 Feet (+50 after seated rest break) Assistive device: Rolling walker (2 wheels) Gait Pattern/deviations: Step-through pattern, Shuffle, Trunk flexed       General Gait Details: Pt requires verbal cues to slow down and focus on clearing LE from floor before stepping, keeping walker closer to body, and environmental awareness. Pt has difficulty maintaining cervical extension due to stiffness in neck.   Stairs             Wheelchair Mobility     Tilt Bed    Modified Rankin (Stroke Patients Only)       Balance Overall balance assessment: Needs assistance Sitting-balance support: Feet supported, No upper extremity supported Sitting balance-Leahy Scale: Good Sitting balance - Comments: sitting EOB without back support   Standing balance support: Reliant on assistive device for balance, During functional activity, Bilateral upper extremity supported Standing balance-Leahy Scale: Poor Standing balance comment: Dependent  on RW                             Communication Communication Communication: Impaired Factors Affecting Communication: Difficulty expressing self (Pt has a hard time expressing what he feels during exertion)  Cognition Arousal: Alert Behavior During Therapy: Flat affect   PT - Cognitive impairments: Safety/Judgement, Problem solving, Sequencing, Initiation, Awareness, Attention                       PT - Cognition Comments: Still needs lots of cues for awareness of environment and self check-ins for activity tolerance with increased activity. Pt required verbal cues to find room and still passed it even with multiple cues and hints. Following commands: Impaired Following commands impaired: Follows one step commands with increased time, Only follows one step commands consistently    Cueing Cueing Techniques: Verbal cues  Exercises      General Comments General comments (skin integrity, edema, etc.): Pt VSS throughout on RA. DOE 2/4. Pt is making improvements but benefits from assistance due to poor safety awareness and fatigue.      Pertinent Vitals/Pain Pain Assessment Pain Assessment: PAINAD Faces Pain Scale: Hurts a little bit Breathing: occasional labored breathing, short period of hyperventilation Negative Vocalization: occasional moan/groan, low speech, negative/disapproving quality Facial Expression: sad, frightened, frown Body Language: tense, distressed pacing, fidgeting Consolability: distracted or reassured by voice/touch PAINAD Score: 5 Pain Descriptors / Indicators: Discomfort Pain Intervention(s): Limited activity within patient's tolerance, Monitored during session, Repositioned    Home Living                          Prior Function            PT Goals (current goals can now be found in the care plan section) Acute Rehab PT Goals Patient Stated Goal: To get stronger before I go home PT Goal Formulation: With patient Time For Goal Achievement: 03/15/24 Progress towards  PT goals: Progressing toward goals    Frequency    Min 2X/week      PT Plan      Co-evaluation              AM-PAC PT 6 Clicks Mobility   Outcome Measure  Help needed turning from your back to your side while in a flat bed without using bedrails?: A Little Help needed moving from lying on your back to sitting on the side of a flat bed without using bedrails?: A Little Help needed moving to and from a bed to a chair (including a wheelchair)?: A Little Help needed standing up from a chair using your arms (e.g., wheelchair or bedside chair)?: A Little Help needed to walk in hospital room?: A Lot Help needed climbing 3-5 steps with a railing? : Total 6 Click Score: 15    End of Session Equipment Utilized During Treatment: Gait belt Activity Tolerance: Patient limited by fatigue Patient left: in chair;with call bell/phone within reach;with chair alarm set Nurse Communication: Mobility status PT Visit Diagnosis: Difficulty in walking, not elsewhere classified (R26.2);Unsteadiness on feet (R26.81);Other abnormalities of gait and mobility (R26.89);Muscle weakness (generalized) (M62.81);Other symptoms and signs involving the nervous system (R29.898);Pain;History of falling (Z91.81)     Time: 8359-8285 PT Time Calculation (min) (ACUTE ONLY): 34 min  Charges:    $Gait Training: 8-22 mins $Therapeutic Activity: 8-22 mins PT General Charges $$ ACUTE PT VISIT: 1  Visit                     Orell Hurtado, SPTA    [facility] Braxton Weisbecker 03/04/2024, 5:57 PM

## 2024-03-04 NOTE — Plan of Care (Signed)

## 2024-03-04 NOTE — Progress Notes (Signed)
 PROGRESS NOTE    Billy [name]  FMW:[id number] DOB: 01-16-1951 DOA: 01/21/2024 PCP: Seabron Lenis, MD    Brief Narrative:   73 year old with history of cardiac arrest earlier this year, congestive heart failure with reduced EF, A-fib, PE presenting with nausea vomiting found to have esophageal rupture/tear requiring stent placement 01/23/2024.  Swallow study revealed additional leak and had stent repositioning 01/31/2024.  There was concerns of gastric content aspiration therefore mechanically ventilated in the ICU.  Bedside ultrasound revealed right-sided pleural effusion requiring chest tube placement with removal of 300 cc of exudative fluid. He was also successfully extubated on 10/16. Patient has had increased oxygen  requirement requiring reintubation on 10/20.  Bedside ultrasound showed left-sided pleural effusion and he had left-sided chest tube with 120 cc exudative output. ICU stay complicated by high O2 requirement, left sided pleural effusion, chest tube placement 10/16, reintubated 10/20.  Eventually, patient was extubated and transferred out of ICU on 10/28. Hospital course complicated by acute on chronic HFpEF for which he is on IV Lasix  per advanced heart failure team. PEG tube placed on 11/3.  Dietitian consulted for initiation of tube feed.  Remains on TPN until tube feed at goal.  Assessment & Plan:    Esophageal rupture/ Boerhaave syndrome Likely from excessive vomiting -S/p stent placement on 10/7 -S/p stent repositioning on 10/16 due to leakage -Esophagogram 10/24 shows persistent leak -PEG tube placed on 11/3.  Currently tolerating tube feed okay, moving towards goal -Continue TPN until TF at goal Both chest tubes are out, currently on IV Zosyn , per discussion with CTS plan is for continuation of antibiotics until esophageal stent removed.  Plans for EGD with stent removal likely next week.   Acute hypoxic/ hypercapnic respiratory failure- Multifactorial including  aspiration pneumonia, pleural effusion and CHF.  Trach aspirate on 10/15 with Candida albicans.  Continue IV Zosyn . Bronchodilators as needed   Acute on chronic diastolic CHF -Limited TTE on 10/23 with LVEF of 60 to 65%.  RHC in 3/25 with mild PAH with severe RV failure but preserved CO.  Appears euvolemic on exam.  Seen by CHF team and appropriate medications were adjusted.  AKI on CKD-3B:  Baseline Cr~1.7.  Creatinine peaked 2.98   Paroxysmal A-fib:  s/p TEE DCCV 3/25.  On amiodarone  and Eliquis    Intractable pain In the ICU required ketamine and Dilaudid  PCA but currently will manage pain with p.o. regimen and breakthrough IV Dilaudid    H/o PE/ DVT-  S/p infrarenal IVC placement 3/25. V/Q 3/25 with multiple perfusion defects -Continue Eliquis    Essential hypertension:  1 isosorbide , losartan, hydralazine  IV as needed   Nausea and diarrhea Improved    Anxiety/depression disorder:  -Stable. -Continue Ativan  as needed -Overall stable mood with flat affect.   Acute on chronic anemia Hemoglobin has been stable.  No evidence of overt blood loss.  Hemoglobin stable around 10 -Continue to follow hemoglobin trend.   Insomnia  - As needed trazodone  has been added to assist patient with his sleeping needs.   GERD -PPI   Severe protein calorie malnutrition/hypoalbuminemia Body mass index is 21.81 kg/m. Nutrition Problem: Severe Malnutrition -Currently getting tube feeds  DVT prophylaxis: Place TED hose Start: 02/27/24 1903 SCDs Start: 01/21/24 2023 apixaban  (ELIQUIS ) tablet 5 mg      Code Status: Full Code Family Communication:  Son Updated [name] [phone] Status is: Inpatient Remains inpatient appropriate because: Continue hospital stay for multiple ongoing issues   PT Follow up Recs: Skilled Nursing-Short Term Rehab (<3  Hours/Day)03/01/2024 1600  Subjective:  Feels ok no new complaints. Requested I spoke to [name], which I did.   Examination:  General exam:  Appears calm and comfortable  Respiratory system: Clear to auscultation. Respiratory effort normal. Cardiovascular system: S1 & S2 heard, RRR. No JVD, murmurs, rubs, gallops or clicks. No pedal edema. Gastrointestinal system: Abdomen is nondistended, soft and nontender. No organomegaly or masses felt. Normal bowel sounds heard. Central nervous system: Alert and oriented. No focal neurological deficits. Extremities: Symmetric 5 x 5 power. Skin: No rashes, lesions or ulcers Psychiatry: Judgement and insight appear normal. Mood & affect appropriate. PICC in place G tube in place.            Wound 02/07/24 0900 Pressure Injury Foot Anterior;Right Stage 1 -  Intact skin with non-blanchable redness of a localized area usually over a bony prominence. (Active)     Wound 02/07/24 0900 Pressure Injury Foot Anterior;Left Stage 1 -  Intact skin with non-blanchable redness of a localized area usually over a bony prominence. (Active)     Wound 02/07/24 0900 Pressure Injury Buttocks Right Stage 1 -  Intact skin with non-blanchable redness of a localized area usually over a bony prominence. (Active)     Diet Orders (From admission, onward)     Start     Ordered   01/31/24 0736  Diet NPO time specified  Diet effective now        01/31/24 0736            Objective: Vitals:   03/03/24 2355 03/04/24 0346 03/04/24 0727 03/04/24 1125  BP: (!) 123/56 (!) 141/60 (!) 144/61 (!) 128/58  Pulse: 74 72 72 67  Resp: 20 20 20 19   Temp: 98.2 F (36.8 C) 98.2 F (36.8 C) 98.2 F (36.8 C) 97.6 F (36.4 C)  TempSrc: Oral Oral Oral Oral  SpO2: 94% 95% 96% 95%  Weight:  75.3 kg    Height:        Intake/Output Summary (Last 24 hours) at 03/04/2024 1223 Last data filed at 03/04/2024 1036 Gross per 24 hour  Intake 1354.82 ml  Output --  Net 1354.82 ml   Filed Weights   03/02/24 0602 03/03/24 0500 03/04/24 0346  Weight: 73.5 kg 75 kg 75.3 kg    Scheduled Meds:  amiodarone   200 mg Per Tube  BID   apixaban   5 mg Per Tube BID   Chlorhexidine  Gluconate Cloth  6 each Topical Daily   feeding supplement (PROSource TF20)  60 mL Per Tube Daily   fiber supplement (BANATROL TF)  60 mL Per Tube QID   free water   100 mL Per Tube Q6H   [name] butt cream   Topical BID   hydrALAZINE   100 mg Per Tube Q8H   insulin  aspart  0-9 Units Subcutaneous Q4H   isosorbide  dinitrate  10 mg Per Tube BID   lidocaine   1 patch Transdermal Q24H   losartan  50 mg Per Tube Daily   pantoprazole  (PROTONIX ) IV  40 mg Intravenous Q12H   saccharomyces boulardii  250 mg Per Tube BID   sodium chloride  flush  10-40 mL Intracatheter Q12H   Continuous Infusions:  feeding supplement (KATE FARMS STANDARD ENT 1.4) 1,000 mL (03/04/24 0014)   piperacillin -tazobactam (ZOSYN )  IV 3.375 g (03/04/24 0536)   promethazine (PHENERGAN) injection (IM or IVPB) 150 mL/hr at 03/01/24 0348    Nutritional status Signs/Symptoms: severe fat depletion, moderate muscle depletion Interventions: Refer to RD note for recommendations Body mass  index is 21.9 kg/m.  Data Reviewed:   CBC: Recent Labs  Lab 03/04/24 0500  WBC 14.2*  HGB 10.1*  HCT 32.2*  MCV 90.4  PLT 215   Basic Metabolic Panel: Recent Labs  Lab 02/27/24 0521 02/29/24 0500 03/01/24 0413 03/04/24 0500  NA  --  143 141 140  K  --  4.4 4.3 4.6  CL  --  113* 111 111  CO2  --  21* 20* 20*  GLUCOSE  --  109* 138* 117*  BUN  --  57* 48* 38*  CREATININE  --  1.97* 1.83* 1.69*  CALCIUM   --  8.7* 8.6* 8.3*  MG 2.7*  --  2.4 2.3   GFR: Estimated Creatinine Clearance: 42.7 mL/min (A) (by C-G formula based on SCr of 1.69 mg/dL (H)). Liver Function Tests: No results for input(s): AST, ALT, ALKPHOS, BILITOT, PROT, ALBUMIN  in the last 168 hours. No results for input(s): LIPASE, AMYLASE in the last 168 hours. No results for input(s): AMMONIA in the last 168 hours. Coagulation Profile: No results for input(s): INR, PROTIME in the last 168  hours. Cardiac Enzymes: No results for input(s): CKTOTAL, CKMB, CKMBINDEX, TROPONINI in the last 168 hours. BNP (last 3 results) No results for input(s): PROBNP in the last 8760 hours. HbA1C: No results for input(s): HGBA1C in the last 72 hours. CBG: Recent Labs  Lab 03/03/24 2011 03/03/24 2355 03/04/24 0349 03/04/24 0725 03/04/24 1123  GLUCAP 124* 114* 123* 108* 118*   Lipid Profile: No results for input(s): CHOL, HDL, LDLCALC, TRIG, CHOLHDL, LDLDIRECT in the last 72 hours. Thyroid  Function Tests: No results for input(s): TSH, T4TOTAL, FREET4, T3FREE, THYROIDAB in the last 72 hours. Anemia Panel: No results for input(s): VITAMINB12, FOLATE, FERRITIN, TIBC, IRON , RETICCTPCT in the last 72 hours. Sepsis Labs: No results for input(s): PROCALCITON, LATICACIDVEN in the last 168 hours.  No results found for this or any previous visit (from the past 240 hours).       Radiology Studies: No results found.         LOS: 43 days   Time spent= 35 mins    Burgess JAYSON Dare, MD [facility]  If 7PM-7AM, please contact night-coverage  03/04/2024, 12:23 PM

## 2024-03-04 NOTE — Plan of Care (Signed)

## 2024-03-05 DIAGNOSIS — K223 Perforation of esophagus: Secondary | ICD-10-CM | POA: Diagnosis not present

## 2024-03-05 LAB — CBC
HCT: 32.9 % — ABNORMAL LOW (ref 39.0–52.0)
Hemoglobin: 10.4 g/dL — ABNORMAL LOW (ref 13.0–17.0)
MCH: 28.9 pg (ref 26.0–34.0)
MCHC: 31.6 g/dL (ref 30.0–36.0)
MCV: 91.4 fL (ref 80.0–100.0)
Platelets: 219 K/uL (ref 150–400)
RBC: 3.6 MIL/uL — ABNORMAL LOW (ref 4.22–5.81)
RDW: 18.8 % — ABNORMAL HIGH (ref 11.5–15.5)
WBC: 13.5 K/uL — ABNORMAL HIGH (ref 4.0–10.5)
nRBC: 0 % (ref 0.0–0.2)

## 2024-03-05 LAB — BASIC METABOLIC PANEL WITH GFR
Anion gap: 7 (ref 5–15)
BUN: 38 mg/dL — ABNORMAL HIGH (ref 8–23)
CO2: 20 mmol/L — ABNORMAL LOW (ref 22–32)
Calcium: 8.5 mg/dL — ABNORMAL LOW (ref 8.9–10.3)
Chloride: 112 mmol/L — ABNORMAL HIGH (ref 98–111)
Creatinine, Ser: 1.67 mg/dL — ABNORMAL HIGH (ref 0.61–1.24)
GFR, Estimated: 43 mL/min — ABNORMAL LOW (ref >60)
Glucose, Bld: 110 mg/dL — ABNORMAL HIGH (ref 70–99)
Potassium: 4.7 mmol/L (ref 3.5–5.1)
Sodium: 139 mmol/L (ref 135–145)

## 2024-03-05 LAB — GLUCOSE, CAPILLARY
Glucose-Capillary: 101 mg/dL — ABNORMAL HIGH (ref 70–99)
Glucose-Capillary: 107 mg/dL — ABNORMAL HIGH (ref 70–99)
Glucose-Capillary: 122 mg/dL — ABNORMAL HIGH (ref 70–99)
Glucose-Capillary: 122 mg/dL — ABNORMAL HIGH (ref 70–99)
Glucose-Capillary: 132 mg/dL — ABNORMAL HIGH (ref 70–99)
Glucose-Capillary: 90 mg/dL (ref 70–99)

## 2024-03-05 LAB — PHOSPHORUS: Phosphorus: 3.2 mg/dL (ref 2.5–4.6)

## 2024-03-05 LAB — MAGNESIUM: Magnesium: 2.4 mg/dL (ref 1.7–2.4)

## 2024-03-05 NOTE — Progress Notes (Signed)
 PROGRESS NOTE    Billy [name]  FMW:[id number] DOB: 11/18/1950 DOA: 01/21/2024 PCP: Seabron Lenis, MD    Brief Narrative:   73 year old with history of cardiac arrest earlier this year, congestive heart failure with reduced EF, A-fib, PE presenting with nausea vomiting found to have esophageal rupture/tear requiring stent placement 01/23/2024.  Swallow study revealed additional leak and had stent repositioning 01/31/2024.  There was concerns of gastric content aspiration therefore mechanically ventilated in the ICU.  Bedside ultrasound revealed right-sided pleural effusion requiring chest tube placement with removal of 300 cc of exudative fluid. He was also successfully extubated on 10/16. Patient has had increased oxygen  requirement requiring reintubation on 10/20.  Bedside ultrasound showed left-sided pleural effusion and he had left-sided chest tube with 120 cc exudative output. ICU stay complicated by high O2 requirement, left sided pleural effusion, chest tube placement 10/16, reintubated 10/20.  Eventually, patient was extubated and transferred out of ICU on 10/28. Hospital course complicated by acute on chronic HFpEF for which he is on IV Lasix  per advanced heart failure team. PEG tube placed on 11/3.  Dietitian consulted for initiation of tube feed.  Remains on TPN until tube feed at goal.  Awaiting endoscopic evaluation next week to consider esophageal stent removal  Assessment & Plan:    Esophageal rupture/ Boerhaave syndrome Likely from excessive vomiting -S/p stent placement on 10/7 -S/p stent repositioning on 10/16 due to leakage -Esophagogram 10/24 shows persistent leak -PEG tube placed on 11/3.  Currently tolerating tube feed okay, moving towards goal -Continue TPN until TF at goal Both chest tubes are out, currently on IV Zosyn , per discussion with CTS plan is for continuation of antibiotics until esophageal stent removed.  Plans for EGD with stent removal likely next week.    Acute hypoxic/ hypercapnic respiratory failure- Multifactorial including aspiration pneumonia, pleural effusion and CHF.  Trach aspirate on 10/15 with Candida albicans.  Continue IV Zosyn . Bronchodilators as needed   Acute on chronic diastolic CHF -Limited TTE on 10/23 with LVEF of 60 to 65%.  RHC in 3/25 with mild PAH with severe RV failure but preserved CO.  Appears euvolemic on exam.  Seen by CHF team and appropriate medications were adjusted.  AKI on CKD-3B:  Baseline Cr~1.7.  Creatinine peaked 2.98   Paroxysmal A-fib:  s/p TEE DCCV 3/25.  On amiodarone  and Eliquis    Intractable pain In the ICU required ketamine and Dilaudid  PCA but currently will manage pain with p.o. regimen and breakthrough IV Dilaudid    H/o PE/ DVT-  S/p infrarenal IVC placement 3/25. V/Q 3/25 with multiple perfusion defects -Continue Eliquis    Essential hypertension:  1 isosorbide , losartan, hydralazine  IV as needed   Nausea and diarrhea Improved    Anxiety/depression disorder:  -Stable. -Continue Ativan  as needed -Overall stable mood with flat affect.   Acute on chronic anemia Hemoglobin has been stable.  No evidence of overt blood loss.  Hemoglobin stable around 10 -Continue to follow hemoglobin trend.   Insomnia  - As needed trazodone  has been added to assist patient with his sleeping needs.   GERD -PPI   Severe protein calorie malnutrition/hypoalbuminemia Body mass index is 21.81 kg/m. Nutrition Problem: Severe Malnutrition -Currently getting tube feeds  DVT prophylaxis: Place TED hose Start: 02/27/24 1903 SCDs Start: 01/21/24 2023 apixaban  (ELIQUIS ) tablet 5 mg      Code Status: Full Code Family Communication:  Son Updated [name] [phone] Status is: Inpatient Remains inpatient appropriate because: Continue hospital stay for multiple ongoing issues  PT Follow up Recs: Skilled Nursing-Short Term Rehab (<3 Hours/Day)03/01/2024 1600  Subjective: Feeling okay no  complaints   Examination:  General exam: Appears calm and comfortable  Respiratory system: Clear to auscultation. Respiratory effort normal. Cardiovascular system: S1 & S2 heard, RRR. No JVD, murmurs, rubs, gallops or clicks. No pedal edema. Gastrointestinal system: Abdomen is nondistended, soft and nontender. No organomegaly or masses felt. Normal bowel sounds heard. Central nervous system: Alert and oriented. No focal neurological deficits. Extremities: Symmetric 5 x 5 power. Skin: No rashes, lesions or ulcers Psychiatry: Judgement and insight appear normal. Mood & affect appropriate. PICC in place G tube in place.            Wound 02/07/24 0900 Pressure Injury Foot Anterior;Right Stage 1 -  Intact skin with non-blanchable redness of a localized area usually over a bony prominence. (Active)     Wound 02/07/24 0900 Pressure Injury Foot Anterior;Left Stage 1 -  Intact skin with non-blanchable redness of a localized area usually over a bony prominence. (Active)     Wound 02/07/24 0900 Pressure Injury Buttocks Right Stage 1 -  Intact skin with non-blanchable redness of a localized area usually over a bony prominence. (Active)     Diet Orders (From admission, onward)     Start     Ordered   01/31/24 0736  Diet NPO time specified  Diet effective now        01/31/24 0736            Objective: Vitals:   03/05/24 0351 03/05/24 0400 03/05/24 0818 03/05/24 0936  BP:  (!) 146/61 (!) 151/69   Pulse: 71 70 70 71  Resp: 20 19 19 19   Temp:  97.9 F (36.6 C) 98.1 F (36.7 C)   TempSrc: Oral Oral Oral   SpO2: 99% 99% 100% 100%  Weight: 74.9 kg     Height:        Intake/Output Summary (Last 24 hours) at 03/05/2024 1032 Last data filed at 03/05/2024 0942 Gross per 24 hour  Intake 2341.92 ml  Output 150 ml  Net 2191.92 ml   Filed Weights   03/03/24 0500 03/04/24 0346 03/05/24 0351  Weight: 75 kg 75.3 kg 74.9 kg    Scheduled Meds:  amiodarone   200 mg Per Tube BID    apixaban   5 mg Per Tube BID   Chlorhexidine  Gluconate Cloth  6 each Topical Daily   feeding supplement (PROSource TF20)  60 mL Per Tube Daily   fiber supplement (BANATROL TF)  60 mL Per Tube QID   free water   100 mL Per Tube Q6H   [name] butt cream   Topical BID   hydrALAZINE   100 mg Per Tube Q8H   insulin  aspart  0-9 Units Subcutaneous Q4H   isosorbide  dinitrate  10 mg Per Tube BID   lidocaine   1 patch Transdermal Q24H   losartan  50 mg Per Tube Daily   pantoprazole  (PROTONIX ) IV  40 mg Intravenous Q12H   saccharomyces boulardii  250 mg Per Tube BID   sodium chloride  flush  10-40 mL Intracatheter Q12H   Continuous Infusions:  feeding supplement (KATE FARMS STANDARD ENT 1.4) 65 mL/hr at 03/04/24 1946   piperacillin -tazobactam (ZOSYN )  IV 3.375 g (03/05/24 0616)   promethazine (PHENERGAN) injection (IM or IVPB) 150 mL/hr at 03/01/24 0348    Nutritional status Signs/Symptoms: severe fat depletion, moderate muscle depletion Interventions: Refer to RD note for recommendations Body mass index is 21.79 kg/m.  Data Reviewed:  CBC: Recent Labs  Lab 03/04/24 0500 03/05/24 0403  WBC 14.2* 13.5*  HGB 10.1* 10.4*  HCT 32.2* 32.9*  MCV 90.4 91.4  PLT 215 219   Basic Metabolic Panel: Recent Labs  Lab 02/29/24 0500 03/01/24 0413 03/04/24 0500 03/05/24 0403  NA 143 141 140 139  K 4.4 4.3 4.6 4.7  CL 113* 111 111 112*  CO2 21* 20* 20* 20*  GLUCOSE 109* 138* 117* 110*  BUN 57* 48* 38* 38*  CREATININE 1.97* 1.83* 1.69* 1.67*  CALCIUM  8.7* 8.6* 8.3* 8.5*  MG  --  2.4 2.3 2.4  PHOS  --   --   --  3.2   GFR: Estimated Creatinine Clearance: 43 mL/min (A) (by C-G formula based on SCr of 1.67 mg/dL (H)). Liver Function Tests: No results for input(s): AST, ALT, ALKPHOS, BILITOT, PROT, ALBUMIN  in the last 168 hours. No results for input(s): LIPASE, AMYLASE in the last 168 hours. No results for input(s): AMMONIA in the last 168 hours. Coagulation  Profile: No results for input(s): INR, PROTIME in the last 168 hours. Cardiac Enzymes: No results for input(s): CKTOTAL, CKMB, CKMBINDEX, TROPONINI in the last 168 hours. BNP (last 3 results) No results for input(s): PROBNP in the last 8760 hours. HbA1C: No results for input(s): HGBA1C in the last 72 hours. CBG: Recent Labs  Lab 03/04/24 1625 03/04/24 1929 03/04/24 2359 03/05/24 0349 03/05/24 0817  GLUCAP 108* 129* 122* 107* 122*   Lipid Profile: No results for input(s): CHOL, HDL, LDLCALC, TRIG, CHOLHDL, LDLDIRECT in the last 72 hours. Thyroid  Function Tests: No results for input(s): TSH, T4TOTAL, FREET4, T3FREE, THYROIDAB in the last 72 hours. Anemia Panel: No results for input(s): VITAMINB12, FOLATE, FERRITIN, TIBC, IRON , RETICCTPCT in the last 72 hours. Sepsis Labs: No results for input(s): PROCALCITON, LATICACIDVEN in the last 168 hours.  No results found for this or any previous visit (from the past 240 hours).       Radiology Studies: No results found.         LOS: 44 days   Time spent= 35 mins    Burgess JAYSON Dare, MD [facility]  If 7PM-7AM, please contact night-coverage  03/05/2024, 10:32 AM

## 2024-03-05 NOTE — TOC Progression Note (Addendum)
 Transition of Care [facility]) - Progression Note    Patient Details  Name: Billy [name] MRN: [id number] Date of Birth: [birth year]  Transition of [facility]) CM/SW Contact  Lauraine FORBES Saa, LCSWA Phone Number: 03/05/2024, 3:03 PM  Clinical Narrative:     3:03 PM CSW provided patient's SNF, [name], with additional SNF bed offer [facility] [place]). [name] bed [facility]. CSW informed SNF of bed acceptance. CSW will submit SNF insurance authorization next week upon patient's procedure next Monday. CSW will continue to follow.  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, SNF Pending bed offer               Expected Discharge Plan and Services In-house Referral: Clinical Social Work Discharge Planning Services: [employer] Consult Post Acute Care Choice: Skilled Nursing Facility Living arrangements for the past 2 months: Single Family Home                                       Social Drivers of Health (SDOH) Interventions SDOH Screenings   Food Insecurity: No Food Insecurity (01/22/2024)  Housing: High Risk (01/22/2024)  Transportation Needs: No Transportation Needs (01/22/2024)  Utilities: Not At Risk (01/22/2024)  Depression (PHQ2-9): Low Risk  (12/01/2020)  Social Connections: Socially Isolated (01/22/2024)  Tobacco Use: Low Risk  (02/19/2024)    Readmission Risk Interventions    06/27/2023    1:52 PM 10/14/2022   11:29 AM  Readmission Risk Prevention Plan  Post Dischage Appt  Complete  Medication Screening  Complete  Transportation Screening Complete Complete  HRI or Home Care Consult Complete   Social Work Consult for Recovery Care Planning/Counseling Complete   Palliative Care Screening Not Applicable   Medication Review Oceanographer) Referral to Pharmacy

## 2024-03-05 NOTE — Plan of Care (Signed)
  Problem: Education: Goal: Knowledge of General Education information will improve Description: Including pain rating scale, medication(s)/side effects and non-pharmacologic comfort measures Outcome: Progressing   Problem: Clinical Measurements: Goal: Diagnostic test results will improve Outcome: Progressing Goal: Respiratory complications will improve Outcome: Progressing Goal: Cardiovascular complication will be avoided Outcome: Progressing   Problem: Activity: Goal: Risk for activity intolerance will decrease Outcome: Progressing   Problem: Coping: Goal: Level of anxiety will decrease Outcome: Progressing   Problem: Pain Managment: Goal: General experience of comfort will improve and/or be controlled Outcome: Progressing   Problem: Safety: Goal: Ability to remain free from injury will improve Outcome: Progressing

## 2024-03-05 NOTE — Progress Notes (Addendum)
 Occupational Therapy Treatment Patient Details Name: Billy [name] MRN: [id number] DOB: November 15, 1950 Today's Date: 03/05/2024   History of present illness Billy [name] is a 73 y.o. male who presented to [facility] ED 01/21/24 with several days of vomiting followed by an episode of severe tearing epigastric pain. Work-up revealed esophageal tear. Pt s/p esophageal stent with EGD 10/7. Return to OR for esophageal stent adjustment on 10/15, remained intubated after procedure due to suspected aspiration. Extubated 10/16.  Pt had CT placed 10/16. Reintubated 10/20-10/24. G tube placed 11/3. He accidentally pulled out the R chest tube on 11/7. MD plan for  PMHx: HFrEF, AFib, CAD, PE complicated by cardiogenic shock, CKD 4, MI, peripheral neuropathy, and TIA. L chest tube removed 11/11   OT comments  Pt progressing toward goals, goals updated. Pt currently needs min A for transfers with RW, and min A for x2 standing grooming tasks at sink. Pt able to complete standing posterior pericare with minA. Pt with overall decr activity tolerance and incr neck/back pain affecting standing tolerance for OOB ADLs. Pt presenting with impairments listed below, will follow acutely. Patient will benefit from continued inpatient follow up therapy, <3 hours/day to maximize safety/ind with ADL/functional mobility.       If plan is discharge home, recommend the following:  Two people to help with walking and/or transfers;Assistance with cooking/housework;Direct supervision/assist for medications management;Direct supervision/assist for financial management;Assist for transportation;Help with stairs or ramp for entrance;A lot of help with bathing/dressing/bathroom   Equipment Recommendations  Other (comment) (defer)    Recommendations for Other Services Rehab consult    Precautions / Restrictions Precautions Precautions: Fall Recall of Precautions/Restrictions: Impaired Precaution/Restrictions Comments: G-tube Restrictions Weight  Bearing Restrictions Per Provider Order: No       Mobility Bed Mobility Overal bed mobility: Needs Assistance Bed Mobility: Supine to Sit Rolling: Contact guard assist              Transfers Overall transfer level: Needs assistance Equipment used: Rolling walker (2 wheels) Transfers: Sit to/from Stand, Bed to chair/wheelchair/BSC Sit to Stand: Min assist                 Balance Overall balance assessment: Needs assistance Sitting-balance support: Feet supported, No upper extremity supported Sitting balance-Leahy Scale: Good Sitting balance - Comments: sitting EOB without back support   Standing balance support: Reliant on assistive device for balance, During functional activity, Bilateral upper extremity supported Standing balance-Leahy Scale: Poor Standing balance comment: Dependent on RW                           ADL either performed or assessed with clinical judgement   ADL Overall ADL's : Needs assistance/impaired     Grooming: Wash/dry face;Wash/dry hands;Standing;Minimal Biomedical Engineer: Ambulation;Rolling walker (2 wheels);Regular Toilet;Minimal assistance Toilet Transfer Details (indicate cue type and reason): simulated via functional mobility Toileting- Clothing Manipulation and Hygiene: Minimal assistance;Sit to/from stand Toileting - Clothing Manipulation Details (indicate cue type and reason): standing pericare     Functional mobility during ADLs: Minimal assistance;Rolling walker (2 wheels)      Extremity/Trunk Assessment Upper Extremity Assessment Upper Extremity Assessment: Generalized weakness   Lower Extremity Assessment Lower Extremity Assessment: Defer to PT evaluation        Vision   Vision Assessment?: No apparent visual deficits   Perception Perception Perception: Not tested  Praxis Praxis Praxis: Not tested   Communication Communication Communication: Impaired Factors  Affecting Communication: Other (comment) (delayed responses at times)   Cognition Arousal: Alert Behavior During Therapy: Flat affect Cognition: Cognition impaired   Orientation impairments: Time   Memory impairment (select all impairments): Short-term memory, Declarative long-term memory, Working memory                       Following commands: Impaired Following commands impaired: Follows one step commands with increased time, Only follows one step commands consistently      Cueing   Cueing Techniques: Verbal cues  Exercises      Shoulder Instructions       General Comments VSS on RA    Pertinent Vitals/ Pain       Pain Assessment Pain Assessment: No/denies pain  Home Living                                          Prior Functioning/Environment              Frequency  Min 2X/week        Progress Toward Goals  OT Goals(current goals can now be found in the care plan section)  Progress towards OT goals: Goals updated  Acute Rehab OT Goals OT Goal Formulation: With patient Time For Goal Achievement: 03/19/24 Potential to Achieve Goals: Fair ADL Goals Pt Will Perform Lower Body Dressing: with supervision;sit to/from stand;sitting/lateral leans Pt Will Transfer to Toilet: with contact guard assist;ambulating;regular height toilet Pt Will Perform Toileting - Clothing Manipulation and hygiene: sitting/lateral leans;sit to/from stand;with supervision Additional ADL Goal #1: pt will tolerate standing functional activity x10 min in prep for OOB ADLs  Plan      Co-evaluation                 AM-PAC OT 6 Clicks Daily Activity     Outcome Measure   Help from another person eating meals?:  (NPO) Help from another person taking care of personal grooming?: A Little (NPO) Help from another person toileting, which includes using toliet, bedpan, or urinal?: A Lot Help from another person bathing (including washing, rinsing,  drying)?: A Lot Help from another person to put on and taking off regular upper body clothing?: A Little Help from another person to put on and taking off regular lower body clothing?: A Lot 6 Click Score: 12    End of Session Equipment Utilized During Treatment: Gait belt;Rolling walker (2 wheels)  OT Visit Diagnosis: Muscle weakness (generalized) (M62.81);Other symptoms and signs involving cognitive function;Unsteadiness on feet (R26.81)   Activity Tolerance Patient limited by fatigue;Patient limited by pain (back and neck pain with prolonged standing)   Patient Left in chair;with call bell/phone within reach;with chair alarm set   Nurse Communication Mobility status; RN to resume tube feeds        Time: 8980-8956 OT Time Calculation (min): 24 min  Charges: OT General Charges $OT Visit: 1 Visit OT Treatments $Self Care/Home Management : 8-22 mins $Therapeutic Activity: 8-22 mins  Josean Lycan K, OTD, OTR/L [employer] Preferred Acute Rehab [phone]   [name] 03/05/2024, 10:52 AM

## 2024-03-06 DIAGNOSIS — K223 Perforation of esophagus: Secondary | ICD-10-CM | POA: Diagnosis not present

## 2024-03-06 LAB — BASIC METABOLIC PANEL WITH GFR
Anion gap: 11 (ref 5–15)
BUN: 40 mg/dL — ABNORMAL HIGH (ref 8–23)
CO2: 19 mmol/L — ABNORMAL LOW (ref 22–32)
Calcium: 8.4 mg/dL — ABNORMAL LOW (ref 8.9–10.3)
Chloride: 109 mmol/L (ref 98–111)
Creatinine, Ser: 1.94 mg/dL — ABNORMAL HIGH (ref 0.61–1.24)
GFR, Estimated: 36 mL/min — ABNORMAL LOW (ref >60)
Glucose, Bld: 110 mg/dL — ABNORMAL HIGH (ref 70–99)
Potassium: 4.5 mmol/L (ref 3.5–5.1)
Sodium: 139 mmol/L (ref 135–145)

## 2024-03-06 LAB — FUNGUS CULTURE WITH STAIN

## 2024-03-06 LAB — GLUCOSE, CAPILLARY
Glucose-Capillary: 109 mg/dL — ABNORMAL HIGH (ref 70–99)
Glucose-Capillary: 114 mg/dL — ABNORMAL HIGH (ref 70–99)
Glucose-Capillary: 119 mg/dL — ABNORMAL HIGH (ref 70–99)
Glucose-Capillary: 124 mg/dL — ABNORMAL HIGH (ref 70–99)
Glucose-Capillary: 125 mg/dL — ABNORMAL HIGH (ref 70–99)
Glucose-Capillary: 127 mg/dL — ABNORMAL HIGH (ref 70–99)

## 2024-03-06 LAB — FUNGAL ORGANISM REFLEX

## 2024-03-06 LAB — FUNGUS CULTURE RESULT

## 2024-03-06 MED ORDER — METOPROLOL SUCCINATE ER 25 MG PO TB24: 12.5000 mg | ORAL_TABLET | Freq: Every day | ORAL | Status: DC

## 2024-03-06 MED ORDER — METOPROLOL TARTRATE 25 MG/10 ML ORAL SUSPENSION
6.2500 mg | Freq: Two times a day (BID) | ORAL | Status: DC
  Administered 2024-03-07 – 2024-03-14 (×17): 6.25 mg
  Filled 2024-03-06 (×20): qty 5

## 2024-03-06 MED ORDER — LOSARTAN POTASSIUM 25 MG PO TABS
25.0000 mg | ORAL_TABLET | Freq: Every day | ORAL | Status: DC
  Administered 2024-03-06 – 2024-03-23 (×16): 25 mg
  Filled 2024-03-06 (×17): qty 1

## 2024-03-06 MED ORDER — ATORVASTATIN CALCIUM 80 MG PO TABS
80.0000 mg | ORAL_TABLET | Freq: Every day | ORAL | Status: DC
  Administered 2024-03-06 – 2024-04-18 (×42): 80 mg
  Filled 2024-03-06 (×38): qty 1

## 2024-03-06 MED ORDER — DIPHENOXYLATE-ATROPINE 2.5-0.025 MG PO TABS
1.0000 | ORAL_TABLET | Freq: Two times a day (BID) | ORAL | Status: DC
  Administered 2024-03-06 – 2024-03-21 (×26): 1
  Filled 2024-03-06 (×25): qty 1

## 2024-03-06 MED ORDER — EZETIMIBE 10 MG PO TABS
10.0000 mg | ORAL_TABLET | Freq: Every day | ORAL | Status: DC
  Administered 2024-03-06 – 2024-04-18 (×42): 10 mg
  Filled 2024-03-06 (×38): qty 1

## 2024-03-06 NOTE — Plan of Care (Signed)
  Problem: Education: Goal: Knowledge of General Education information will improve Description: Including pain rating scale, medication(s)/side effects and non-pharmacologic comfort measures Outcome: Progressing   Problem: Clinical Measurements: Goal: Will remain free from infection Outcome: Progressing   Problem: Activity: Goal: Risk for activity intolerance will decrease Outcome: Progressing   Problem: Nutrition: Goal: Adequate nutrition will be maintained Outcome: Progressing   Problem: Coping: Goal: Level of anxiety will decrease Outcome: Progressing   Problem: Pain Managment: Goal: General experience of comfort will improve and/or be controlled Outcome: Progressing   Problem: Safety: Goal: Ability to remain free from injury will improve Outcome: Progressing   Problem: Skin Integrity: Goal: Risk for impaired skin integrity will decrease Outcome: Progressing

## 2024-03-06 NOTE — Progress Notes (Signed)
 [name]   ROUNDING   NOTE Billy [name] FMW:[id number]  DOB: 09/18/1950  DOA: 01/21/2024  PCP: Seabron Lenis, MD  03/06/2024,5:35 PM  LOS: 45 days    Code Status: Full code     from: Home   73 year old male Prolonged admission 3/10-07/20/18/2025 cardiac arrest requiring ICU management cardioverted for A-fib-found to have DVTs then had hematochezia necessitating IVC filter-recurrent GI bleed secondary to erosive gastropathy and friable duodenal mucosa and colonoscopy was deferred and then chest tube placements cardiac cath at that time showed CAD with CTO of RCA and was treated with diuretics and sent home on torsemide  Entresto  Jardiance -  Readmitted 10/5 with nausea vomiting sudden tearing sensation found to have an esophageal tear was given Kcentra   Chronology  10/7 esophageal stent placed by cardiothoracic 10/15 stent reposition 10/16 extubated 10/20 reintubated with ultrasound showing left-sided pleural effusion-left-sided chest tube placed-by lights criteria exudative effusion 10/23 echo 60-65% 10/24 barium swallow showed persistent leak 10/28 transferred out of ICU 11/3 PEG tube placed   Assessment  & Plan :    Boerhaave syndrome esophageal rupture-events as above-last esophagram 10/24 showed persistent leak Remains on IV Zosyn  with mild leukocytosis currently Defer to CVTS planning and likely removal of esophageal stent-possibly on Monday Requires gastrostomy that was placed for nutrition continue as per dietitian-continue free water  100 every 6 Remains on IV Protonix  For pain control continue Dilaudid  0.5 Q3 as needed severe pain, 5 mg oxy IR for moderate pain Hypoxic respiratory failure multifactorial with pneumonia pleural effusion CHF Last chest x-ray 11/8 shows removal of right pleural catheter remains on Zosyn  as above Symptomatic management with Robitussin 5 every 4 as needed levalbuterol  every 6 as needed Desat screen-PT is recommending skilled for rehabilitation as he was  completely able to take care of himself before this hospital stay Paroxysmal A-fib with DCCV 07/06/2023 Amiodarone  200 twice daily with metoprolol  as needed IV for heart rate above 110 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 Multiple bedsores and wounds anterior right and left foot and injury stage I to bottom Continue Gerhardt twice daily Lidoderm  patch for back pain Can give Lomotil  1 tab every 4 as needed for diarrhea stool--separate order placed for scheduled Lomotil   Data Reviewed today:  Sodium 138 potassium 4.5 CO2 19 BUN/creatinine 40/1.9 calcium  WBC 13 hemoglobin 10.4 platelet 219  DVT prophylaxis: DOAC  Status is: Inpatient Inpatient pending dysphagia stent removal    Dispo/Global plan: Inpatient   Time 70   Subjective:   Seems comfortable no distress has not 2-3 stools not on oxygen   no chest pain no fever   Objective + exam Vitals:   03/06/24 0816 03/06/24 1200 03/06/24 1312 03/06/24 1602  BP: 139/61 132/67 132/67 (!) 141/69  Pulse: 73 66  67  Resp: 19 20  20   Temp: (!) 97.5 F (36.4 C) 98.5 F (36.9 C)  98 F (36.7 C)  TempSrc: Oral Oral  Oral  SpO2: 96% 96%  96%  Weight:  Height:       Filed Weights   03/04/24 0346 03/05/24 0351 03/06/24 0418  Weight: 75.3 kg 74.9 kg 74.8 kg     Examination:  EOMI NCAT slightly flat affect no icterus no pallor Neck soft supple Chest is clear  posterolaterally S1-S2 no murmur Abdomen soft PEG tube in place slightly distended No lower extremity edema Wearing compression hose     Scheduled Meds:  amiodarone   200 mg Per Tube BID   apixaban   5 mg Per Tube BID   atorvastatin   80 mg Per Tube Daily   Chlorhexidine  Gluconate Cloth  6 each Topical Daily   diphenoxylate-atropine  1 tablet Oral BID   ezetimibe   10 mg Per Tube Daily   feeding supplement (PROSource TF20)  60 mL Per Tube Daily   fiber supplement (BANATROL TF)  60 mL Per Tube QID   free water   100 mL Per Tube Q6H   [name] butt cream   Topical BID   hydrALAZINE   100 mg Per Tube Q8H   insulin  aspart  0-9 Units Subcutaneous Q4H   isosorbide  dinitrate  10 mg Per Tube BID   lidocaine   1 patch Transdermal Q24H   losartan  25 mg Per Tube Daily   metoprolol  tartrate  6.25 mg Per Tube BID   pantoprazole  (PROTONIX ) IV  40 mg Intravenous Q12H   saccharomyces boulardii  250 mg Per Tube BID   sodium chloride  flush  10-40 mL Intracatheter Q12H   Continuous Infusions:  feeding supplement (KATE FARMS STANDARD ENT 1.4) 1,000 mL (03/06/24 0916)   piperacillin -tazobactam (ZOSYN )  IV 3.375 g (03/06/24 1323)   promethazine (PHENERGAN) injection (IM or IVPB) 150 mL/hr at 03/01/24 0348   acetaminophen  (TYLENOL ) oral liquid 160 mg/5 mL, alum & mag hydroxide-simeth, diclofenac Sodium, diphenhydrAMINE **OR** diphenhydrAMINE, [COMPLETED] diphenoxylate-atropine **FOLLOWED BY** diphenoxylate-atropine, glucagon (human recombinant), guaiFENesin , hydrALAZINE , HYDROmorphone  (DILAUDID ) injection, levalbuterol, lip balm, metoCLOPramide (REGLAN) injection, metoprolol  tartrate, naloxone  **AND** sodium chloride  flush, mouth rinse, oxyCODONE , promethazine (PHENERGAN) injection (IM or IVPB), simethicone , sodium chloride , sodium chloride  flush, traZODone   Jai-Gurmukh Viet Kemmerer, MD  [facility]

## 2024-03-06 NOTE — Progress Notes (Signed)
 Prolonged hospitalization with complications from esophageal rupture after a prolonged hospitalization for cardiac arrest 3/10 to 4/3.  Per note independent functioning prior to this hospitalization but now severe deconditioning and rehab recommendations at discharge

## 2024-03-07 ENCOUNTER — Inpatient Hospital Stay (HOSPITAL_COMMUNITY)

## 2024-03-07 DIAGNOSIS — R609 Edema, unspecified: Secondary | ICD-10-CM

## 2024-03-07 DIAGNOSIS — K223 Perforation of esophagus: Secondary | ICD-10-CM | POA: Diagnosis not present

## 2024-03-07 LAB — GLUCOSE, CAPILLARY
Glucose-Capillary: 107 mg/dL — ABNORMAL HIGH (ref 70–99)
Glucose-Capillary: 108 mg/dL — ABNORMAL HIGH (ref 70–99)
Glucose-Capillary: 110 mg/dL — ABNORMAL HIGH (ref 70–99)
Glucose-Capillary: 114 mg/dL — ABNORMAL HIGH (ref 70–99)
Glucose-Capillary: 116 mg/dL — ABNORMAL HIGH (ref 70–99)
Glucose-Capillary: 122 mg/dL — ABNORMAL HIGH (ref 70–99)
Glucose-Capillary: 92 mg/dL (ref 70–99)

## 2024-03-07 LAB — BASIC METABOLIC PANEL WITH GFR
Anion gap: 8 (ref 5–15)
BUN: 38 mg/dL — ABNORMAL HIGH (ref 8–23)
CO2: 21 mmol/L — ABNORMAL LOW (ref 22–32)
Calcium: 8.4 mg/dL — ABNORMAL LOW (ref 8.9–10.3)
Chloride: 106 mmol/L (ref 98–111)
Creatinine, Ser: 1.78 mg/dL — ABNORMAL HIGH (ref 0.61–1.24)
GFR, Estimated: 40 mL/min — ABNORMAL LOW (ref >60)
Glucose, Bld: 111 mg/dL — ABNORMAL HIGH (ref 70–99)
Potassium: 4.6 mmol/L (ref 3.5–5.1)
Sodium: 135 mmol/L (ref 135–145)

## 2024-03-07 MED ORDER — KATE FARMS STANDARD 1.4 EN LIQD: 163.0000 mL | Freq: Every day | ENTERAL | Status: DC

## 2024-03-07 MED ORDER — KATE FARMS STANDARD 1.4 EN LIQD
163.0000 mL | Freq: Every day | ENTERAL | Status: AC
  Administered 2024-03-07 (×3): 163 mL
  Filled 2024-03-07 (×4): qty 163

## 2024-03-07 MED ORDER — FREE WATER
120.0000 mL | Freq: Every day | Status: DC
  Administered 2024-03-07 – 2024-03-08 (×6): 120 mL

## 2024-03-07 MED ORDER — KATE FARMS STANDARD 1.4 EN LIQD
325.0000 mL | Freq: Every day | ENTERAL | Status: DC
  Administered 2024-03-08 (×3): 325 mL
  Filled 2024-03-07 (×6): qty 325

## 2024-03-07 MED ORDER — GUAIFENESIN 100 MG/5ML PO LIQD
5.0000 mL | ORAL | Status: DC | PRN
  Administered 2024-03-21 – 2024-03-27 (×2): 5 mL
  Filled 2024-03-07 (×2): qty 10

## 2024-03-07 NOTE — Plan of Care (Signed)
   Problem: Education: Goal: Knowledge of General Education information will improve Description Including pain rating scale, medication(s)/side effects and non-pharmacologic comfort measures Outcome: Progressing

## 2024-03-07 NOTE — Progress Notes (Signed)
 Patient's right forearm swollen, cool to touch, edema 1-2+, measuring 31 cm. Left arm cool to touch, no edema, measuring 28 cm. Reports no pain in either arm. Bilateral lower extremities, no edema. Lungs clear throughout. Patient does have a PICC line in right upper arm. Will page MD to notify.

## 2024-03-07 NOTE — Progress Notes (Signed)
 Physical Therapy Treatment Patient Details Name: Billy [name] MRN: [id number] DOB: Jul 11, 1950 Today's Date: 03/07/2024   History of Present Illness Billy [name] is a 73 y.o. male who presented to [facility] ED 01/21/24 with several days of vomiting followed by an episode of severe tearing epigastric pain. Work-up revealed esophageal tear. Pt s/p esophageal stent with EGD 10/7. Return to OR for esophageal stent adjustment on 10/15, remained intubated after procedure due to suspected aspiration. Extubated 10/16.  Pt had CT placed 10/16. Reintubated 10/20-10/24. G tube placed 11/3. He accidentally pulled out the R chest tube on 11/7. MD plan for  PMHx: HFrEF, AFib, CAD, PE complicated by cardiogenic shock, CKD 4, MI, peripheral neuropathy, and TIA. L chest tube removed 11/11    PT Comments  Pt received in supine, agreeable to work with SPTA. RN disconnected pt from IV and PEG prior to ambulation. Pt required min A and is unsteady on feet upon standing. Pt CGA with gait +2 for chair follow. Pt required 2 seated breaks during gait trial today and requires verbal cues for activity pacing and energy conservation. Pt demonstrated increased frustration when cognitively challenged with way finding. Patient will benefit from continued inpatient follow up therapy, <3 hours/day    If plan is discharge home, recommend the following: A little help with walking and/or transfers;A little help with bathing/dressing/bathroom;Assistance with cooking/housework;Direct supervision/assist for medications management;Assist for transportation;Help with stairs or ramp for entrance   Can travel by private vehicle     Yes  Equipment Recommendations  Wheelchair (measurements PT);Wheelchair cushion (measurements PT);Rolling walker (2 wheels)    Recommendations for Other Services Rehab consult     Precautions / Restrictions Precautions Precautions: Fall Recall of Precautions/Restrictions: Impaired Precaution/Restrictions Comments:  G-tube, needs increased time to move Restrictions Weight Bearing Restrictions Per Provider Order: No     Mobility  Bed Mobility Overal bed mobility: Needs Assistance Bed Mobility: Supine to Sit     Supine to sit: HOB elevated, Contact guard     General bed mobility comments: Pt able to get EOB CGA. RN unconnected pt from PEG and IV prior to ambulation.    Transfers Overall transfer level: Needs assistance Equipment used: Rolling walker (2 wheels) Transfers: Sit to/from Stand, Bed to chair/wheelchair/BSC Sit to Stand: Min assist, +2 safety/equipment   Step pivot transfers: Contact guard assist, +2 safety/equipment       General transfer comment: Pt requires min A +2 for standing from EOB due to lack of safety awareness and unsteadiness upon standing.    Ambulation/Gait Ambulation/Gait assistance: +2 safety/equipment, Contact guard assist (Chair follow) Gait Distance (Feet): 100 Feet (x4 with two, 3 minute+ seated rest breaks) Assistive device: Rolling walker (2 wheels) Gait Pattern/deviations: Step-through pattern       General Gait Details: Pt requires verbal cues to slow pace for energy conservation and safety. Pt demonstrated increased frustration when cognitively challeneged with way finding during gait. Requires multimodal cues to find room numbers.   Stairs             Wheelchair Mobility     Tilt Bed    Modified Rankin (Stroke Patients Only)       Balance Overall balance assessment: Needs assistance Sitting-balance support: Feet supported, No upper extremity supported Sitting balance-Leahy Scale: Good Sitting balance - Comments: EOB   Standing balance support: Reliant on assistive device for balance, During functional activity, Bilateral upper extremity supported Standing balance-Leahy Scale: Poor Standing balance comment: Dependent on RW  Communication Communication Communication: Impaired Factors  Affecting Communication: Other (comment)  Cognition Arousal: Alert Behavior During Therapy: Flat affect, Anxious   PT - Cognitive impairments: Awareness, Orientation, Sequencing, Problem solving, Safety/Judgement                       PT - Cognition Comments: Pt has difficulty way finding even with multimodal cues. Pt easily frustrated with cognitive challenges. Following commands: Impaired Following commands impaired: Follows one step commands with increased time, Only follows one step commands consistently    Cueing Cueing Techniques: Verbal cues, Visual cues, Gestural cues  Exercises      General Comments General comments (skin integrity, edema, etc.): Pt unsteady after standing with RW, requires cues to slow down with gait, and difficult way finding. Pt SpO2 94% and above when signal was good.      Pertinent Vitals/Pain Pain Assessment Pain Assessment: PAINAD Faces Pain Scale: Hurts a little bit Breathing: occasional labored breathing, short period of hyperventilation Negative Vocalization: occasional moan/groan, low speech, negative/disapproving quality Facial Expression: sad, frightened, frown Body Language: tense, distressed pacing, fidgeting Consolability: distracted or reassured by voice/touch PAINAD Score: 5 Pain Intervention(s): Monitored during session, Limited activity within patient's tolerance    Home Living                          Prior Function            PT Goals (current goals can now be found in the care plan section) Acute Rehab PT Goals Patient Stated Goal: To get stronger before I go home PT Goal Formulation: With patient Time For Goal Achievement: 03/15/24 Progress towards PT goals: Progressing toward goals    Frequency    Min 2X/week      PT Plan      Co-evaluation              AM-PAC PT 6 Clicks Mobility   Outcome Measure  Help needed turning from your back to your side while in a flat bed without using  bedrails?: A Little Help needed moving from lying on your back to sitting on the side of a flat bed without using bedrails?: A Little Help needed moving to and from a bed to a chair (including a wheelchair)?: A Little Help needed standing up from a chair using your arms (e.g., wheelchair or bedside chair)?: A Little Help needed to walk in hospital room?: A Lot Help needed climbing 3-5 steps with a railing? : Total 6 Click Score: 15    End of Session Equipment Utilized During Treatment: Gait belt Activity Tolerance: Patient limited by fatigue;Other (comment) (Frustration seemed to play apart in pt wanting to return to room) Patient left: in chair;with call bell/phone within reach;with chair alarm set Nurse Communication: Mobility status PT Visit Diagnosis: Difficulty in walking, not elsewhere classified (R26.2);Unsteadiness on feet (R26.81);Other abnormalities of gait and mobility (R26.89);Muscle weakness (generalized) (M62.81);Other symptoms and signs involving the nervous system (R29.898);Pain;History of falling (Z91.81)     Time: 8577-8546 PT Time Calculation (min) (ACUTE ONLY): 31 min  Charges:    $Gait Training: 8-22 mins $Therapeutic Activity: 8-22 mins PT General Charges $$ ACUTE PT VISIT: 1 Visit                     Billy [name]    [facility] Billy [name] 03/07/2024, 4:01 PM

## 2024-03-07 NOTE — Plan of Care (Signed)
  Problem: Education: Goal: Knowledge of General Education information will improve Description: Including pain rating scale, medication(s)/side effects and non-pharmacologic comfort measures Outcome: Progressing   Problem: Clinical Measurements: Goal: Ability to maintain clinical measurements within normal limits will improve Outcome: Progressing Goal: Diagnostic test results will improve Outcome: Progressing   Problem: Elimination: Goal: Will not experience complications related to bowel motility Outcome: Progressing   Problem: Pain Managment: Goal: General experience of comfort will improve and/or be controlled Outcome: Progressing

## 2024-03-07 NOTE — Progress Notes (Addendum)
 Physical Therapy Treatment Patient Details Name: Billy [name] MRN: [id number] DOB: 03-12-1951 Today's Date: 03/07/2024   History of Present Illness Billy [name] is a 73 y.o. male who presented to [facility] ED 01/21/24 with several days of vomiting followed by an episode of severe tearing epigastric pain. Work-up revealed esophageal tear. Pt s/p esophageal stent with EGD 10/7. Return to OR for esophageal stent adjustment on 10/15, remained intubated after procedure due to suspected aspiration. Extubated 10/16.  Pt had CT placed 10/16. Reintubated 10/20-10/24. G tube placed 11/3. He accidentally pulled out the R chest tube on 11/7. RUE ultrasound 11/20 (after session) with results pending at time of documentation. PMHx: HFrEF, AFib, CAD, PE complicated by cardiogenic shock, CKD 4, MI, peripheral neuropathy, and TIA. L chest tube removed 11/11    PT Comments  Pt received in supine, agreeable to therapy session, requesting pain meds prior to OOB, RN notified. PTA reviewed supine LE ROM and exercises to reduce risk of DVT and reinforced pressure relief strategies and benefits of mobility. Session time limited due to arrival of vascular lab personnel to perform RUE ultrasound, RN also preparing to give pain meds so pt agreeable to additional session later in the day to work more on gait/OOB mobility. Plan to work more on cognitive challenges during standing/gait tasks later in the day as pt is lacking insight into deficits and safety and still having difficulty with wayfinding and attention during sessions. Patient will benefit from continued inpatient follow up therapy, <3 hours/day.   If plan is discharge home, recommend the following: A little help with walking and/or transfers;A little help with bathing/dressing/bathroom;Assistance with cooking/housework;Direct supervision/assist for medications management;Assist for transportation;Help with stairs or ramp for entrance   Can travel by private vehicle     Yes   Equipment Recommendations  Wheelchair (measurements PT);Wheelchair cushion (measurements PT);Rolling walker (2 wheels)    Recommendations for Other Services Rehab consult     Precautions / Restrictions Precautions Precautions: Fall Recall of Precautions/Restrictions: Impaired Precaution/Restrictions Comments: G-tube, needs increased time to move Restrictions Weight Bearing Restrictions Per Provider Order: No     Mobility  Bed Mobility Overal bed mobility: Needs Assistance             General bed mobility comments: Pt assisted to reposition LE and RUE and lines set up prior to attempting EOB/OOB, however vascular lab personnel arrived to room to check RUE ultrasound, so PTA defer EOB/OOB for pt safety until this is complete. Pt was also asking for pain meds prior to OOB so had to wait anyways for RN to give meds.    Transfers Overall transfer level: Needs assistance                 General transfer comment: Pt defer until after pain meds given    Ambulation/Gait                   Stairs             Wheelchair Mobility     Tilt Bed    Modified Rankin (Stroke Patients Only)       Balance Overall balance assessment: Needs assistance Sitting-balance support: Feet supported, No upper extremity supported   Sitting balance - Comments: defer; pt having procedure so session time limited   Standing balance support: Reliant on assistive device for balance, During functional activity, Bilateral upper extremity supported   Standing balance comment: defer; pt having procedure so session time limited  Communication Communication Communication: Impaired Factors Affecting Communication: Other (comment) (delayed responses at times)  Cognition Arousal: Alert Behavior During Therapy: Flat affect, Anxious   PT - Cognitive impairments: Orientation, Initiation, Problem solving   Orientation impairments: Time                    PT - Cognition Comments: Pt self-limiting and slow to initiate; pt asking for time of day, but has clock. Session time and assessment limited due to arrival of vascular to perform ultrasound which took about 20 mins, pt agreeable to PTA returning later in day to work on OOB mobility. Following commands: Impaired Following commands impaired: Follows one step commands with increased time, Only follows one step commands consistently    Cueing Cueing Techniques: Verbal cues  Exercises General Exercises - Lower Extremity Ankle Circles/Pumps: AROM, Both, 5 reps, Supine (pt reports hx of pain on L ankle with circles so encouraged DF/PF instead) Quad Sets: Strengthening, Supine, 5 reps, Both (limited carryover from prev sessions) Heel Slides: AROM, Both, Supine (a few reps for teachback)    General Comments General comments (skin integrity, edema, etc.): PTA reinforced benefits of OOB to chair and discussed pressure relief technique/frequency (Q2h in supine, Q20 mins in chair and body posture/timing for this); RUE elevated on pillow and tube feeds stopped (RN notified after session) due to PTA lowering HOB <30 deg to reposition pt to prepare for vasc lab arrival.Pulse Rate 60;  Resp 20  BP 149/73 supine; SpO2 98 % on RA      Pertinent Vitals/Pain Pain Assessment Pain Assessment: 0-10 Pain Score: 9  Pain Location: feeding tube site, back, neck Pain Descriptors / Indicators: Discomfort, Grimacing, Guarding Pain Intervention(s): Limited activity within patient's tolerance, Monitored during session, Repositioned, Patient requesting pain meds-RN notified    Home Living                          Prior Function            PT Goals (current goals can now be found in the care plan section) Acute Rehab PT Goals Patient Stated Goal: To get stronger before I go home PT Goal Formulation: With patient Time For Goal Achievement: 03/15/24 Progress towards PT goals:  Progressing toward goals    Frequency    Min 2X/week      PT Plan      Co-evaluation              AM-PAC PT 6 Clicks Mobility   Outcome Measure  Help needed turning from your back to your side while in a flat bed without using bedrails?: A Little Help needed moving from lying on your back to sitting on the side of a flat bed without using bedrails?: A Little Help needed moving to and from a bed to a chair (including a wheelchair)?: A Little Help needed standing up from a chair using your arms (e.g., wheelchair or bedside chair)?: A Little Help needed to walk in hospital room?: A Lot Help needed climbing 3-5 steps with a railing? : Total 6 Click Score: 15    End of Session   Activity Tolerance: Other (comment) (vasc lab arriving to perform ultrasound; PTA to return later for OOB mobility if time) Patient left: in bed;with call bell/phone within reach;with bed alarm set;Other (comment) (vasc lab personnel arriving; tube feeds stopped due to [facility] <30 deg; RN notified ~1 hour after PTA left room as therapist got distracted  by pt asking for pain meds) Nurse Communication: Mobility status;Patient requests pain meds;Other (comment) (tube feeds stopped during session, needs to be reconnected) PT Visit Diagnosis: Difficulty in walking, not elsewhere classified (R26.2);Unsteadiness on feet (R26.81);Other abnormalities of gait and mobility (R26.89);Muscle weakness (generalized) (M62.81);Other symptoms and signs involving the nervous system (R29.898);Pain;History of falling (Z91.81)     Time: 8840-8789 PT Time Calculation (min) (ACUTE ONLY): 11 min  Charges:    $Therapeutic Activity: 8-22 mins PT General Charges $$ ACUTE PT VISIT: 1 Visit                     Taher Vannote P., PTA Acute Rehabilitation Services Secure Chat Preferred 9a-5:30pm Office: [phone]    Connell HERO [facility] 03/07/2024, 2:12 PM

## 2024-03-07 NOTE — Progress Notes (Signed)
 [name]   ROUNDING   NOTE Billy [name] FMW:[id number]  DOB: Sep 13, 1950  DOA: 01/21/2024  PCP: Billy Lenis, MD  03/07/2024,6:22 PM  LOS: 46 days    Code Status: Full code     from: Home   73 year old male Prolonged admission 3/10-07/20/18/2025 cardiac arrest requiring ICU management cardioverted for A-fib-found to have DVTs then had hematochezia necessitating IVC filter-recurrent GI bleed secondary to erosive gastropathy and friable duodenal mucosa and colonoscopy was deferred and then chest tube placements cardiac cath at that time showed CAD with CTO of RCA and was treated with diuretics and sent home on torsemide  Entresto  Jardiance -  Readmitted 10/5 with nausea vomiting sudden tearing sensation found to have an esophageal tear was given Kcentra   Chronology  10/7 esophageal stent placed by cardiothoracic 10/15 stent reposition 10/16 extubated 10/20 reintubated with ultrasound showing left-sided pleural effusion-left-sided chest tube placed-by lights criteria exudative effusion 10/23 echo 60-65% 10/24 barium swallow showed persistent leak 10/28 transferred out of ICU 11/3 PEG tube placed 11/19 swelling of right arm showing on ultrasound SVT cephalic vein   Assessment  & Plan :    Boerhaave syndrome esophageal rupture-events as above-last esophagram 10/24 showed persistent leak Remains on IV Zosyn  with mild leukocytosis currently Will reach out to Billy [name] of CVTS to figure out next steps and stent removal Requires gastrostomy, continue feeds continue free water  100 every 6 Remains on IV Protonix  For pain control continue Dilaudid  0.5 Q3 as needed severe pain, 5 mg oxy IR for moderate pain Superficial venous thrombosis Only watchful waiting already on Eliquis  and this should resolve Hypoxic respiratory failure multifactorial with pneumonia pleural effusion CHF Last chest x-ray 11/8 shows removal of right pleural catheter Symptomatic management with Robitussin 5 every 4 as needed  levalbuterol  every 6 as needed Desat screen-PT is recommending skilled for rehabilitation as he was completely able to take care of himself before this hospital stay Paroxysmal A-fib with DCCV 07/06/2023 Amiodarone  200 twice daily with metoprolol  as needed IV for heart rate above 110 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 Taking Eliquis  5 twice daily via tube-suspect will need longer-term anticoagulation Cardiac arrest 06/2023 Continue GDMT Imdur  10 twice daily, holding ARB for now as below-holding Entresto  and Demadex  at this time Give metoprolol  6.25 twice daily via tube-resumed Cozaar  25 via tube--atorvastatin  80, Zetia  10 restarted Bipolar Insomnia May continue trazodone  50-aim to wean at discharge Protein energy malnutrition BMI 21 Supplements up as above try to prevent diarrhea Hypertension Blood pressure is not well-controlled-Will adjust meds in the next several days CKD 3B Metabolic acidosis Labs are variable-no changes but is back on ARB. Multiple bedsores and wounds anterior right and left foot and injury stage I to bottom Continue Billy [name] twice daily Lidoderm  patch for back pain Can give Lomotil  1 tab every 4 as needed for diarrhea stool--separate order placed for scheduled Lomotil  and his diarrhea is improved  Data Reviewed today:  Sodium 135 potassium 4.6 BUN/creatinine 38/1.7 Hemoglobin 10.4 platelet 219 WBC 13.5  DVT prophylaxis: DOAC  Status is: Inpatient Inpatient pending dysphagia stent removal    Dispo/Global plan: Inpatient   Time 70   Subjective:   Overall comfortable Events overnight noted with regards to Billy [name] in his right arm   Objective + exam Vitals:   03/07/24 0300 03/07/24 0800 03/07/24 1245 03/07/24 1612  BP: (!) 152/70 (!) 150/65 (!) 149/73 (!) 143/70  Pulse: 65 67 60 62  Resp: (!) 21 20 20 19   Temp: 97.7 F (  36.5 C) 97.6 F (36.4 C) 98.1 F (36.7 C) 98.2 F (36.8 C)  TempSrc: Oral Oral Oral Oral  SpO2: 96% 95% 98%    Weight: 73.7 kg     Height:       Filed Weights   03/05/24 0351 03/06/24 0418 03/07/24 0300  Weight: 74.9 kg 74.8 kg 73.7 kg     Examination:  EOMI NCAT slightly flat affect no icterus no pallor Neck soft supple Chest is clear posterolaterally Swollen right arm PEG tube in place No lower extremity edema has socks on    Scheduled Meds:  amiodarone   200 mg Per Tube BID   apixaban   5 mg Per Tube BID   atorvastatin   80 mg Per Tube Daily   Chlorhexidine  Gluconate Cloth  6 each Topical Daily   diphenoxylate-atropine  1 tablet Per Tube BID   ezetimibe   10 mg Per Tube Daily   feeding supplement ([employer] STANDARD ENT 1.4)  163 mL Per Tube 5 X Daily   [START ON 03/08/2024] feeding supplement ([employer] STANDARD ENT 1.4)  325 mL Per Tube 5 X Daily   feeding supplement (PROSource TF20)  60 mL Per Tube Daily   fiber supplement (BANATROL TF)  60 mL Per Tube QID   free water   120 mL Per Tube 5 X Daily   Billy [name]'s butt cream   Topical BID   hydrALAZINE   100 mg Per Tube Q8H   insulin  aspart  0-9 Units Subcutaneous Q4H   isosorbide  dinitrate  10 mg Per Tube BID   lidocaine   1 patch Transdermal Q24H   losartan  25 mg Per Tube Daily   metoprolol  tartrate  6.25 mg Per Tube BID   pantoprazole  (PROTONIX ) IV  40 mg Intravenous Q12H   saccharomyces boulardii  250 mg Per Tube BID   sodium chloride  flush  10-40 mL Intracatheter Q12H   Continuous Infusions:  piperacillin -tazobactam (ZOSYN )  IV 3.375 g (03/07/24 1504)   promethazine (PHENERGAN) injection (IM or IVPB) 150 mL/hr at 03/01/24 0348   acetaminophen  (TYLENOL ) oral liquid 160 mg/5 mL, alum & mag hydroxide-simeth, diclofenac Sodium, [COMPLETED] diphenoxylate-atropine **FOLLOWED BY** diphenoxylate-atropine, glucagon (human recombinant), guaiFENesin , hydrALAZINE , HYDROmorphone  (DILAUDID ) injection, levalbuterol, lip balm, metoprolol  tartrate, mouth rinse, oxyCODONE , promethazine (PHENERGAN) injection (IM or IVPB), simethicone , sodium  chloride, sodium chloride  flush, traZODone   Jai-Gurmukh Bernerd Terhune, MD  [facility]

## 2024-03-07 NOTE — Progress Notes (Addendum)
 Nutrition Follow-up  DOCUMENTATION CODES:   Severe malnutrition in context of acute illness/injury  INTERVENTION:  Transition to bolus feedings via PEG: - Administer 1/2 carton of formula for first 3 feeds and continue to increase by 1/2 carton every 3 feeds until goal amount is tolerated  -[employer] Standard 1.4 - 1 carton (325 ml) 5x daily -Prosource TF20 60ml daily - FWF: 60mL before and after each bolus (600 ml per day) -Provides 2355 kcal, 120 gm protein, 1770 ml free water  daily   Contnue Banatrol QID -provides 45kcal, 5g soluble fiber per serving Continue Florastor BID for re-establishing gut microbiome   NUTRITION DIAGNOSIS:  Severe Malnutrition related to acute illness (may have chronic component due to time frame of reported wt loss) as evidenced by severe fat depletion, moderate muscle depletion.   GOAL:  Patient will meet greater than or equal to 90% of their needs  MONITOR:  Diet advancement, Labs, Weight trends, I & O's, Skin (TPN)  REASON FOR ASSESSMENT:   Consult Enteral/tube feeding initiation and management  ASSESSMENT:   73 yo male admitted post several days of N/V followed by sudden onset of severe epigastric pain radiating to chest and back. Pt found to have small esophageal perforation above GE junction. PMH includes CAD, chronic HFpEF, HTN, HLD, DVT, CVA, PAF, CKD 3b.  10/05 Admitted 10/06 Esophagogram/barium study: distal esophageal perforation, TPN started 10/07 S/p Esophageal stent, TPN at goal rate of 80 ml/hr 10/08  CLD 10/10 Diet advanced to Dysphagia 3, Calorie Count 10/13 No meal tickets for calorie count, pt reports eating bites only 10/15 Esophageal stent migration with leak on esophagram with return to OR for stent repositioning, likely aspiration event requiring intubation 10/16 Extubated, Chest tube placed 10/17 Limited echo with EF 50-55%, RV mildly reduced 10/20 Intubated early AM, L pleural effusion, pigtail chest tube  inserted 10/23 - extubated; limited echo: 60-65% 10/24 - esophagram: persistent leak 10/28 - transfer out of ICU 11/03 - PEG tube placement  11/04 - TF started 11/5 - TF at 20 ml/hr 11/6 - TPN decreased to meet 50% of pt's needs, TF at 50 ml/hr,  11/7 - TF at 60 ml/hr, multiple loose BM, decrease rate of tube feeds to 45 ml/hr, continue TPN to meet 50% of needs 11/10 - increase TF to 37ml/hr; TPN meet 50% of needs 11/11 - increase TF to goal rate (modified to Vital 1.5 as Mallie [employer]ion out of stock); d/c TPN tomorrow 11/12 - modify to Vital 1.5 @60ml /hr (goal rate) 11/15 - increased stool output; modified back to KF1.4 @65ml /hr (goal rate) 11/20 - modified to bolus feedings (KF1.4 x5 cartons daily)   He is scheduled for esophageal stent removal beginning of next week. Remains on IV ABX until then. Remains on PRN Lomotil . MD scheduled Lomotil  BID as well yesterday.    Tolerating [employer] formula well. Bowel movements have decreased in frequent, per documentation (type 6 x2 over last 24 hours). RN reports decreased rectal tone as he was unable to have rectal tube placed as it would not stay in, despite balloon inflation. Will modify to bolus feedings today and monitor tolerance. This will allow gut to rest between feedings and promote absorption and normalize bowel frequent. Will begin with five feedings daily and, if tolerates, can decrease to 3-4 per day. Discussed with patient at bedside, who is amicable to this.   Has not required regular insulin  doses due to parameters for administration not being met. Has also not received PRN Phenergan  in  last few days. No N/V.     Admit Weight: 72.8 kg Current Weight: 73.7 kg Lowest Weight: 66.8 kg on 11/06   Weight stable, continues with slow trend upward. Suggests tue body weight gain. Some edema to RUE noted by RN. MD aware.    Unable to trend corrected calcium  as no recent albumin  draw. BUN/Crt stable. Remains off diuretics.   Drains/Lines: R  brachial: PICC, triple lumen LUQ: PEG (24Fr) placed 11/03 UOP: 950 ml x24 hours   Labs from 11/10 reviewd: Sodium 135 (wdl) Potassium 4.6 (wdl) BUN 38 Creatinine 1.78 (H) WBC 13.5(H) CBGs 110-111 x24 hours A1c 6.0 (01/2024)   Meds: SS Novolog  - has not been receiving as parameters not met Lomotil Pantoprazole  Florastor IV ABX    NUTRITION - FOCUSED PHYSICAL EXAM:  Will repeat NFPE on follow up exam.   Diet Order:   Diet Order             Diet NPO time specified  Diet effective now                   EDUCATION NEEDS:   Education needs have been addressed  Skin:  Skin Assessment: Skin Integrity Issues: Skin Integrity Issues:: Stage I Stage I: buttocks, bilateral feet  Last BM:  11/17 - type6 x2 over last 24 hours  Height:  Ht Readings from Last 1 Encounters:  02/07/24 6' 1 (1.854 m)   Weight:  Wt Readings from Last 1 Encounters:  03/07/24 73.7 kg    Ideal Body Weight:  83.6 kg  BMI:  Body mass index is 21.44 kg/m.  Estimated Nutritional Needs:   Kcal:  2300-2500 kcals  Protein:  110-130 g  Fluid:  2L  Blair Deaner MS, RD, LDN Registered Dietitian Clinical Nutrition RD Inpatient Contact Info in Amion

## 2024-03-08 ENCOUNTER — Other Ambulatory Visit: Payer: Self-pay | Admitting: Cardiovascular Disease

## 2024-03-08 DIAGNOSIS — K223 Perforation of esophagus: Secondary | ICD-10-CM | POA: Diagnosis not present

## 2024-03-08 LAB — GLUCOSE, CAPILLARY
Glucose-Capillary: 114 mg/dL — ABNORMAL HIGH (ref 70–99)
Glucose-Capillary: 125 mg/dL — ABNORMAL HIGH (ref 70–99)
Glucose-Capillary: 136 mg/dL — ABNORMAL HIGH (ref 70–99)
Glucose-Capillary: 147 mg/dL — ABNORMAL HIGH (ref 70–99)
Glucose-Capillary: 84 mg/dL (ref 70–99)
Glucose-Capillary: 95 mg/dL (ref 70–99)

## 2024-03-08 MED ORDER — FREE WATER
100.0000 mL | Freq: Four times a day (QID) | Status: DC
  Administered 2024-03-08 – 2024-03-18 (×30): 100 mL

## 2024-03-08 MED ORDER — KATE FARMS STANDARD 1.4 EN LIQD
1000.0000 mL | ENTERAL | Status: DC
  Administered 2024-03-08 – 2024-03-17 (×8): 1000 mL
  Filled 2024-03-08 (×16): qty 1000

## 2024-03-08 MED ORDER — PROSOURCE TF20 ENFIT COMPATIBL EN LIQD
60.0000 mL | Freq: Every day | ENTERAL | Status: DC
  Administered 2024-03-09 – 2024-03-17 (×7): 60 mL
  Filled 2024-03-08 (×11): qty 60

## 2024-03-08 MED ORDER — HYDRALAZINE HCL 10 MG PO TABS: 10.0000 mg | ORAL_TABLET | Freq: Three times a day (TID) | ORAL | Status: DC | PRN

## 2024-03-08 NOTE — Plan of Care (Signed)
  Problem: Education: Goal: Knowledge of General Education information will improve Description: Including pain rating scale, medication(s)/side effects and non-pharmacologic comfort measures Outcome: Progressing   Problem: Clinical Measurements: Goal: Diagnostic test results will improve Outcome: Progressing   Problem: Activity: Goal: Risk for activity intolerance will decrease Outcome: Progressing   Problem: Coping: Goal: Level of anxiety will decrease Outcome: Progressing   Problem: Pain Managment: Goal: General experience of comfort will improve and/or be controlled Outcome: Progressing   Problem: Skin Integrity: Goal: Risk for impaired skin integrity will decrease Outcome: Progressing

## 2024-03-08 NOTE — Progress Notes (Signed)
 Occupational Therapy Treatment Patient Details Name: Billy [name] MRN: [id number] DOB: 1950-05-05 Today's Date: 03/08/2024   History of present illness Billy [name] is a 73 y.o. male who presented to [facility] ED 01/21/24 with several days of vomiting followed by an episode of severe tearing epigastric pain. Work-up revealed esophageal tear. Pt s/p esophageal stent with EGD 10/7. Return to OR for esophageal stent adjustment on 10/15, remained intubated after procedure due to suspected aspiration. Extubated 10/16.  Pt had CT placed 10/16. Reintubated 10/20-10/24. G tube placed 11/3. He accidentally pulled out the R chest tube on 11/7. Found to have superficial venous thrombosis of RUE 11/19. PMHx: HFrEF, AFib, CAD, PE complicated by cardiogenic shock, CKD 4, MI, peripheral neuropathy, and TIA. L chest tube removed 11/11   OT comments  Pt making good progress towards goals. Pt received after personal care assistance by NT and eager to walk. Pt able to mobilize > 242ft using RW with CGA and one seated rest break. Cues for pacing, posture and breathing techniques needed w/ fair carryover. Pt with ongoing cognitive deficits different than baseline w/ difficulty recalling location of room on unit. Patient will benefit from continued inpatient follow up therapy, <3 hours/day at DC. VSS on RA.      If plan is discharge home, recommend the following:  Assistance with cooking/housework;Direct supervision/assist for medications management;Direct supervision/assist for financial management;Assist for transportation;Help with stairs or ramp for entrance;A lot of help with bathing/dressing/bathroom;A lot of help with walking and/or transfers   Equipment Recommendations  None recommended by OT (TBD)    Recommendations for Other Services      Precautions / Restrictions Precautions Precautions: Fall Recall of Precautions/Restrictions: Impaired Precaution/Restrictions Comments: G-tube Restrictions Weight Bearing  Restrictions Per Provider Order: No       Mobility Bed Mobility Overal bed mobility: Needs Assistance Bed Mobility: Supine to Sit     Supine to sit: Supervision, HOB elevated, Used rails          Transfers Overall transfer level: Needs assistance Equipment used: Rolling walker (2 wheels) Transfers: Sit to/from Stand Sit to Stand: Contact guard assist           General transfer comment: quickly stands and grabs to RW x 2 during session     Balance Overall balance assessment: Needs assistance Sitting-balance support: Feet supported, No upper extremity supported Sitting balance-Leahy Scale: Good     Standing balance support: Reliant on assistive device for balance, During functional activity, Bilateral upper extremity supported Standing balance-Leahy Scale: Poor                             ADL either performed or assessed with clinical judgement   ADL Overall ADL's : Needs assistance/impaired                 Upper Body Dressing : Set up;Sitting                   Functional mobility during ADLs: Contact guard assist;+2 for safety/equipment;Rolling walker (2 wheels) General ADL Comments: Emphasis on mobility in hallway for endurance retraining; cues for breathing/pacing needed and assessment of recall of navigation back to room give cognitive deficits.    Extremity/Trunk Assessment Upper Extremity Assessment Upper Extremity Assessment: Generalized weakness;Right hand dominant   Lower Extremity Assessment Lower Extremity Assessment: Defer to PT evaluation        Vision   Vision Assessment?: No apparent visual deficits   Perception  Praxis     Communication Communication Communication: Impaired Factors Affecting Communication: Hearing impaired   Cognition Arousal: Alert Behavior During Therapy: Flat affect, Anxious Cognition: Cognition impaired     Awareness: Online awareness impaired Memory impairment (select all  impairments): Short-term memory, Declarative long-term memory, Working memory Attention impairment (select first level of impairment): Sustained attention Executive functioning impairment (select all impairments): Organization, Reasoning, Problem solving, Sequencing OT - Cognition Comments: pleasant today but can get anxious very easily with tasks. memory deficits remain as pt unable to recall directions back to his room                 Following commands: Impaired Following commands impaired: Follows one step commands with increased time, Only follows one step commands consistently      Cueing   Cueing Techniques: Verbal cues, Visual cues, Gestural cues  Exercises      Shoulder Instructions       General Comments      Pertinent Vitals/ Pain       Pain Assessment Pain Assessment: No/denies pain  Home Living Family/patient expects to be discharged to:: Private residence                                        Prior Functioning/Environment              Frequency  Min 2X/week        Progress Toward Goals  OT Goals(current goals can now be found in the care plan section)  Progress towards OT goals: Progressing toward goals  Acute Rehab OT Goals OT Goal Formulation: With patient Time For Goal Achievement: 03/19/24 Potential to Achieve Goals: Fair ADL Goals Pt Will Perform Grooming: with supervision;standing Pt Will Perform Lower Body Bathing: with supervision;sit to/from stand Pt Will Perform Upper Body Dressing: with min assist;sitting Pt Will Perform Lower Body Dressing: with supervision;sit to/from stand;sitting/lateral leans Pt Will Transfer to Toilet: with contact guard assist;ambulating;regular height toilet Pt Will Perform Toileting - Clothing Manipulation and hygiene: sitting/lateral leans;sit to/from stand;with supervision Pt/caregiver will Perform Home Exercise Program: Increased strength;Both right and left upper extremity;With  theraband Additional ADL Goal #1: pt will tolerate standing functional activity x10 min in prep for OOB ADLs Additional ADL Goal #2: Pt to increase standing tolerance > 8 min during ADLs/mobility without need for seated rest break  Plan      Co-evaluation                 AM-PAC OT 6 Clicks Daily Activity     Outcome Measure   Help from another person eating meals?: Total (NPO) Help from another person taking care of personal grooming?: A Little Help from another person toileting, which includes using toliet, bedpan, or urinal?: A Lot Help from another person bathing (including washing, rinsing, drying)?: A Lot Help from another person to put on and taking off regular upper body clothing?: A Little Help from another person to put on and taking off regular lower body clothing?: A Lot 6 Click Score: 13    End of Session Equipment Utilized During Treatment: Gait belt;Rolling walker (2 wheels)  OT Visit Diagnosis: Muscle weakness (generalized) (M62.81);Other symptoms and signs involving cognitive function;Unsteadiness on feet (R26.81)   Activity Tolerance Patient tolerated treatment well   Patient Left in chair;with call bell/phone within reach;with chair alarm set   Nurse Communication Mobility status  Time: 8947-8882 OT Time Calculation (min): 25 min  Charges: OT General Charges $OT Visit: 1 Visit OT Treatments $Therapeutic Activity: 23-37 mins  Mliss NOVAK, OTR/L Acute Rehab Services Office: [phone]   Mliss Fish 03/08/2024, 12:09 PM

## 2024-03-08 NOTE — TOC Progression Note (Signed)
 Transition of Care [facility]) - Progression Note    Patient Details  Name: Billy [name] MRN: [id number] Date of Birth: [birth year]  [facility] CM/SW Contact  Lauraine FORBES Saa, LCSWA Phone Number: 03/08/2024, 9:26 AM  Clinical Narrative:     9:26 AM Per chart review, patient is anticipated to have stent removed Monday. CSW to submit SNF insurance authorization for [facility] upon stent removal. CSW will continue to follow.  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, SNF Pending bed offer               Expected Discharge Plan and Services In-house Referral: Clinical Social Work Discharge Planning Services: [employer] Consult Post Acute Care Choice: Skilled Nursing Facility Living arrangements for the past 2 months: Single Family Home                                       Social Drivers of Health (SDOH) Interventions SDOH Screenings   Food Insecurity: No Food Insecurity (01/22/2024)  Housing: High Risk (01/22/2024)  Transportation Needs: No Transportation Needs (01/22/2024)  Utilities: Not At Risk (01/22/2024)  Depression (PHQ2-9): Low Risk  (12/01/2020)  Social Connections: Socially Isolated (01/22/2024)  Tobacco Use: Low Risk  (02/19/2024)    Readmission Risk Interventions    06/27/2023    1:52 PM 10/14/2022   11:29 AM  Readmission Risk Prevention Plan  Post Dischage Appt  Complete  Medication Screening  Complete  Transportation Screening Complete Complete  HRI or Home Care Consult Complete   Social Work Consult for Recovery Care Planning/Counseling Complete   Palliative Care Screening Not Applicable   Medication Review Oceanographer) Referral to Pharmacy

## 2024-03-08 NOTE — Progress Notes (Signed)
 [name]   ROUNDING   NOTE Billy [name] FMW:[id number]  DOB: 08-06-1950  DOA: 01/21/2024  PCP: Seabron Lenis, MD  03/08/2024,4:18 PM  LOS: 47 days    Code Status: Full code     from: Home   73 year old male Prolonged admission 3/10-07/20/18/2025 cardiac arrest requiring ICU management cardioverted for A-fib-found to have DVTs then had hematochezia necessitating IVC filter-recurrent GI bleed secondary to erosive gastropathy and friable duodenal mucosa and colonoscopy was deferred and then chest tube placements cardiac cath at that time showed CAD with CTO of RCA and was treated with diuretics and sent home on torsemide  Entresto  Jardiance -  Readmitted 10/5 with nausea vomiting sudden tearing sensation found to have an esophageal tear was given Kcentra   Chronology  10/7 esophageal stent placed by cardiothoracic 10/15 stent reposition 10/16 extubated 10/20 reintubated with ultrasound showing left-sided pleural effusion-left-sided chest tube placed-by lights criteria exudative effusion 10/23 echo 60-65% 10/24 barium swallow showed persistent leak 10/28 transferred out of ICU 11/3 PEG tube placed 11/19 swelling of right arm showing on ultrasound SVT cephalic vein   Assessment  & Plan :    Boerhaave syndrome esophageal rupture-events as above-last esophagram 10/24 showed persistent leak IV Zosyn  continees-- Dr. Shyrl of CVTS aware re: stent removal Requires gastrostomy, continue feeds continue free water  100 every 6, IV Protonix  40 bd Do not change to bolus feeds--is having discomfort--continuous feeds for now For pain control continue Dilaudid  0.5 Q3 as needed severe pain, 5 mg oxy IR for moderate pain Superficial venous thrombosis noted as above Only watchful waiting already on Eliquis  and this should resolve Hypoxic respiratory failure multifactorial with pneumonia pleural effusion CHF Last chest x-ray 11/8 shows removal of right pleural catheter---Robitussin 5 every 4 as needed levalbuterol   every 6 as needed re: symptoms Desat screen-PT is recommending skilled for rehabilitation  Paroxysmal A-fib with DCCV 07/06/2023 Amiodarone  200 twice daily with metoprolol  as needed IV for heart rate above 110 Converted to NSR OP disucssion vs de-escalation here off Amio 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  Adding scheduled hydralazine  10 tid Resumed losartan  25 CKD 3B Metabolic acidosis Labs are variable-no changes Multiple bedsores and wounds anterior right and left foot and injury stage I to bottom Continue Gerhardt twice daily Lidoderm  patch for back pain Can give Lomotil  1 tab every 4 as needed for diarrhea stool--separate order placed for scheduled Lomotil  and his diarrhea is improved  Data Reviewed today:   None  DVT prophylaxis: DOAC  Status is: Inpatient Inpatient pending dysphagia stent removal    Dispo/Global plan: Inpatient   Time 70   Subjective:   Some discomfort in abdomen after starting bolus feeds No fever no chills No nausea No chest pain   Objective + exam Vitals:   03/08/24 0413 03/08/24 0730 03/08/24 1140 03/08/24 1514  BP: (!) 155/76 (!) 141/57 (!) 114/57 (!) 142/53  Pulse: 63 66 64 70  Resp: 19 18 13 20   Temp: 97.9 F (36.6 C) 98 F (36.7 C) 98 F (36.7 C) 97.6 F (36.4 C)  TempSrc: Oral Oral Oral Oral  SpO2:  95% 95% 96%  Weight: 75.5 kg     Height:       Filed Weights   03/06/24 0418 03/07/24 0300 03/08/24 0413  Weight: 74.8 kg 73.7 kg  75.5 kg     Examination:  EOMI NCAT slightly flat affect no icterus no pallor Neck soft supple Chest clear Abdomen is slightly distended PEG tube in place Right arm is slightly swollen    Scheduled Meds:  amiodarone   200 mg Per Tube BID   apixaban   5 mg Per Tube BID   atorvastatin   80 mg Per Tube Daily   Chlorhexidine  Gluconate Cloth  6 each Topical Daily   diphenoxylate -atropine   1 tablet Per Tube BID   ezetimibe   10 mg Per Tube Daily   feeding supplement (KATE FARMS STANDARD ENT 1.4)  325 mL Per Tube 5 X Daily   [START ON 03/09/2024] feeding supplement (PROSource TF20)  60 mL Per Tube Daily   fiber supplement (BANATROL TF)  60 mL Per Tube QID   free water   120 mL Per Tube 5 X Daily   [name] butt cream   Topical BID   hydrALAZINE   100 mg Per Tube Q8H   insulin  aspart  0-9 Units Subcutaneous Q4H   isosorbide  dinitrate  10 mg Per Tube BID   lidocaine   1 patch Transdermal Q24H   losartan   25 mg Per Tube Daily   metoprolol  tartrate  6.25 mg Per Tube BID   pantoprazole  (PROTONIX ) IV  40 mg Intravenous Q12H   saccharomyces boulardii  250 mg Per Tube BID   sodium chloride  flush  10-40 mL Intracatheter Q12H   Continuous Infusions:  piperacillin -tazobactam (ZOSYN )  IV 3.375 g (03/08/24 1421)   promethazine  (PHENERGAN ) injection (IM or IVPB) 150 mL/hr at 03/01/24 0348   acetaminophen  (TYLENOL ) oral liquid 160 mg/5 mL, alum & mag hydroxide-simeth, diclofenac  Sodium, [COMPLETED] diphenoxylate -atropine  **FOLLOWED BY** diphenoxylate -atropine , glucagon  (human recombinant), guaiFENesin , hydrALAZINE , HYDROmorphone  (DILAUDID ) injection, levalbuterol , lip balm, metoprolol  tartrate, mouth rinse, oxyCODONE , promethazine  (PHENERGAN ) injection (IM or IVPB), simethicone , sodium chloride , sodium chloride  flush, traZODone   Colen Grimes, MD  [facility]

## 2024-03-08 NOTE — Progress Notes (Addendum)
 Nutrition Brief Note  Patient is tolerating 1 carton feedings, per report. Started full cartons this morning with 6AM feeding. Endorses no N/V. Bowels stable. Will monitor over the weekend, with goal to transition to 2 cartons twice daily and 1 carton once daily to receive full regimen (5 cartons) across three feedings. If discharges to SNF prior to goal regimen being achieved, instructions for titration up to goal regimen can be viewed below. Still tentatively scheduled for stent removal Monday then d/c to SNF when insurance authorization received.   ADDENDUM 1601 on 11/21: Received secure chat from RN with MD requesting patient be adjusted back to continuous feeds as he had reported chest pain this afternoon after his bolus feeding. Of note, he has received three boluses of 1/2 carton and two boluses of 1 carton in last 24 hours without issue. Will re-initiate continuous feeds and re-assess next week.   INTERVENTION:  Continue TF via PEG: [place] Standard 1.4 to 65 ml/h (1560 ml per day) -Prosource TF20 60ml daily - FWF: 100 mL q6h (400 mL/day) -Provides 2264 kcal, 117 gm protein, 1508 ml free water  daily   Contnue Banatrol QID -provides 45kcal, 5g soluble fiber per serving Continue Florastor BID for re-establishing gut microbiome    NUTRITION DIAGNOSIS:  Severe Malnutrition related to acute illness (may have chronic component due to time frame of reported wt loss) as evidenced by severe fat depletion, moderate muscle depletion.   GOAL:  Patient will meet greater than or equal to 90% of their needs  Blair Deaner MS, RD, LDN Registered Dietitian Clinical Nutrition RD Inpatient Contact Info in Amion

## 2024-03-09 ENCOUNTER — Inpatient Hospital Stay (HOSPITAL_COMMUNITY)

## 2024-03-09 DIAGNOSIS — K223 Perforation of esophagus: Secondary | ICD-10-CM | POA: Diagnosis not present

## 2024-03-09 DIAGNOSIS — Z452 Encounter for adjustment and management of vascular access device: Secondary | ICD-10-CM | POA: Diagnosis not present

## 2024-03-09 DIAGNOSIS — J90 Pleural effusion, not elsewhere classified: Secondary | ICD-10-CM | POA: Diagnosis not present

## 2024-03-09 DIAGNOSIS — I517 Cardiomegaly: Secondary | ICD-10-CM | POA: Diagnosis not present

## 2024-03-09 DIAGNOSIS — R918 Other nonspecific abnormal finding of lung field: Secondary | ICD-10-CM | POA: Diagnosis not present

## 2024-03-09 LAB — CBC WITH DIFFERENTIAL/PLATELET
Abs Immature Granulocytes: 0.22 K/uL — ABNORMAL HIGH (ref 0.00–0.07)
Basophils Absolute: 0.1 K/uL (ref 0.0–0.1)
Basophils Relative: 1 %
Eosinophils Absolute: 0.1 K/uL (ref 0.0–0.5)
Eosinophils Relative: 1 %
HCT: 31.9 % — ABNORMAL LOW (ref 39.0–52.0)
Hemoglobin: 9.9 g/dL — ABNORMAL LOW (ref 13.0–17.0)
Immature Granulocytes: 2 %
Lymphocytes Relative: 7 %
Lymphs Abs: 0.7 K/uL (ref 0.7–4.0)
MCH: 27.8 pg (ref 26.0–34.0)
MCHC: 31 g/dL (ref 30.0–36.0)
MCV: 89.6 fL (ref 80.0–100.0)
Monocytes Absolute: 0.8 K/uL (ref 0.1–1.0)
Monocytes Relative: 8 %
Neutro Abs: 8.9 K/uL — ABNORMAL HIGH (ref 1.7–7.7)
Neutrophils Relative %: 81 %
Platelets: 238 K/uL (ref 150–400)
RBC: 3.56 MIL/uL — ABNORMAL LOW (ref 4.22–5.81)
RDW: 17.7 % — ABNORMAL HIGH (ref 11.5–15.5)
WBC: 10.9 K/uL — ABNORMAL HIGH (ref 4.0–10.5)
nRBC: 0 % (ref 0.0–0.2)

## 2024-03-09 LAB — BASIC METABOLIC PANEL WITH GFR
Anion gap: 10 (ref 5–15)
BUN: 36 mg/dL — ABNORMAL HIGH (ref 8–23)
CO2: 21 mmol/L — ABNORMAL LOW (ref 22–32)
Calcium: 8.2 mg/dL — ABNORMAL LOW (ref 8.9–10.3)
Chloride: 107 mmol/L (ref 98–111)
Creatinine, Ser: 1.79 mg/dL — ABNORMAL HIGH (ref 0.61–1.24)
GFR, Estimated: 40 mL/min — ABNORMAL LOW (ref >60)
Glucose, Bld: 88 mg/dL (ref 70–99)
Potassium: 4.5 mmol/L (ref 3.5–5.1)
Sodium: 138 mmol/L (ref 135–145)

## 2024-03-09 LAB — GLUCOSE, CAPILLARY
Glucose-Capillary: 95 mg/dL (ref 70–99)
Glucose-Capillary: 116 mg/dL — ABNORMAL HIGH (ref 70–99)
Glucose-Capillary: 96 mg/dL (ref 70–99)
Glucose-Capillary: 120 mg/dL — ABNORMAL HIGH (ref 70–99)
Glucose-Capillary: 128 mg/dL — ABNORMAL HIGH (ref 70–99)
Glucose-Capillary: 131 mg/dL — ABNORMAL HIGH (ref 70–99)

## 2024-03-09 MED ORDER — SODIUM CHLORIDE (PF) 0.9 % IJ SOLN
INTRAMUSCULAR | Status: AC
  Administered 2024-03-09: 10 mL
  Filled 2024-03-09: qty 10

## 2024-03-09 MED ORDER — CYCLOBENZAPRINE HCL 10 MG PO TABS
5.0000 mg | ORAL_TABLET | Freq: Three times a day (TID) | ORAL | Status: DC | PRN
Start: 2024-03-09 — End: 2024-03-10
  Administered 2024-03-09 – 2024-03-10 (×2): 5 mg via ORAL
  Filled 2024-03-09 (×2): qty 1

## 2024-03-09 MED ORDER — METHOCARBAMOL 1000 MG/10ML IJ SOLN
500.0000 mg | Freq: Three times a day (TID) | INTRAMUSCULAR | Status: DC | PRN
Start: 2024-03-09 — End: 2024-03-10
  Administered 2024-03-09: 500 mg via INTRAVENOUS
  Filled 2024-03-09 (×2): qty 5

## 2024-03-09 NOTE — Plan of Care (Addendum)
 Throughout the day patient was complaining about some back pain concern for musculoskeletal origin.  Chest x-ray has been obtained which did not showed any perforation or free air however concern for aspiration pneumonia and no evidence of pneumothorax.  Patient is already on IV Zosyn .  Will continue same antibiotic.  Given chest x-ray showing new evidence of pneumonia/aspiration pneumonia will get blood culture and sputum culture.   Poppy Mcafee, MD [facility] 03/09/2024, 7:10 PM

## 2024-03-09 NOTE — Plan of Care (Signed)
  Problem: Education: Goal: Knowledge of General Education information will improve Description: Including pain rating scale, medication(s)/side effects and non-pharmacologic comfort measures Outcome: Progressing   Problem: Clinical Measurements: Goal: Will remain free from infection Outcome: Progressing   Problem: Nutrition: Goal: Adequate nutrition will be maintained Outcome: Progressing   Problem: Elimination: Goal: Will not experience complications related to bowel motility Outcome: Progressing   Problem: Pain Managment: Goal: General experience of comfort will improve and/or be controlled Outcome: Progressing   Problem: Skin Integrity: Goal: Risk for impaired skin integrity will decrease Outcome: Progressing

## 2024-03-09 NOTE — Progress Notes (Signed)
 Complaining of upper right back pain, immediately over shoulder blade. Not patient's normal area of pain. Described as throbbing. This began earlier in the day as chest pain radiating across chest, axilla to axilla, eventually radiating to current area. Normal prn regimen available has not been effective.  Patient assessed, repositioned. Area of pain shows bruising, discoloration or edema. Dr. Sundil paged. K-pad ordered, Robaxin  ordered. Patient now resting, reports decrease in pain.

## 2024-03-09 NOTE — Plan of Care (Signed)
  Problem: Education: Goal: Knowledge of General Education information will improve Description: Including pain rating scale, medication(s)/side effects and non-pharmacologic comfort measures Outcome: Progressing   Problem: Health Behavior/Discharge Planning: Goal: Ability to manage health-related needs will improve Outcome: Progressing   Problem: Clinical Measurements: Goal: Will remain free from infection Outcome: Progressing Goal: Diagnostic test results will improve Outcome: Progressing Goal: Respiratory complications will improve Outcome: Progressing Goal: Cardiovascular complication will be avoided Outcome: Progressing   Problem: Activity: Goal: Risk for activity intolerance will decrease Outcome: Progressing   Problem: Nutrition: Goal: Adequate nutrition will be maintained Outcome: Progressing   Problem: Coping: Goal: Level of anxiety will decrease Outcome: Progressing   Problem: Elimination: Goal: Will not experience complications related to bowel motility Outcome: Progressing Goal: Will not experience complications related to urinary retention Outcome: Progressing   Problem: Pain Managment: Goal: General experience of comfort will improve and/or be controlled Outcome: Progressing   Problem: Safety: Goal: Ability to remain free from injury will improve Outcome: Progressing   Problem: Skin Integrity: Goal: Risk for impaired skin integrity will decrease Outcome: Progressing   Problem: Education: Goal: Ability to describe self-care measures that may prevent or decrease complications (Diabetes Survival Skills Education) will improve Outcome: Progressing   Problem: Coping: Goal: Ability to adjust to condition or change in health will improve Outcome: Progressing   Problem: Fluid Volume: Goal: Ability to maintain a balanced intake and output will improve Outcome: Progressing   Problem: Health Behavior/Discharge Planning: Goal: Ability to identify and  utilize available resources and services will improve Outcome: Progressing Goal: Ability to manage health-related needs will improve Outcome: Progressing   Problem: Metabolic: Goal: Ability to maintain appropriate glucose levels will improve Outcome: Progressing   Problem: Nutritional: Goal: Maintenance of adequate nutrition will improve Outcome: Progressing Goal: Progress toward achieving an optimal weight will improve Outcome: Progressing   Problem: Skin Integrity: Goal: Risk for impaired skin integrity will decrease Outcome: Progressing   Problem: Tissue Perfusion: Goal: Adequacy of tissue perfusion will improve Outcome: Progressing   Problem: Education: Goal: Ability to describe self-care measures that may prevent or decrease complications (Diabetes Survival Skills Education) will improve Outcome: Progressing Goal: Individualized Educational Video(s) Outcome: Progressing   Problem: Coping: Goal: Ability to adjust to condition or change in health will improve Outcome: Progressing   Problem: Fluid Volume: Goal: Ability to maintain a balanced intake and output will improve Outcome: Progressing   Problem: Health Behavior/Discharge Planning: Goal: Ability to identify and utilize available resources and services will improve Outcome: Progressing Goal: Ability to manage health-related needs will improve Outcome: Progressing   Problem: Metabolic: Goal: Ability to maintain appropriate glucose levels will improve Outcome: Progressing   Problem: Nutritional: Goal: Maintenance of adequate nutrition will improve Outcome: Progressing Goal: Progress toward achieving an optimal weight will improve Outcome: Progressing   Problem: Skin Integrity: Goal: Risk for impaired skin integrity will decrease Outcome: Progressing   Problem: Tissue Perfusion: Goal: Adequacy of tissue perfusion will improve Outcome: Progressing   Problem: Activity: Goal: Ability to tolerate  increased activity will improve Outcome: Progressing   Problem: Respiratory: Goal: Ability to maintain a clear airway and adequate ventilation will improve Outcome: Progressing

## 2024-03-09 NOTE — TOC Progression Note (Signed)
 Transition of Care [facility]) - Progression Note    Patient Details  Name: Billy [name] MRN: [id number] Date of Birth: 10-21-1950  Transition of Care [facility]) CM/SW Contact  Isaiah Public, LCSWA Phone Number: 03/09/2024, 10:03 AM  Clinical Narrative:     Patient has SNF bed at [facility] CSW following to start insurance authorization for SNF closer to patient being medically ready for dc. TOC will continue to follow.  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, SNF Pending bed offer               Expected Discharge Plan and Services In-house Referral: Clinical Social Work Discharge Planning Services: [employer] Consult Post Acute Care Choice: Skilled Nursing Facility Living arrangements for the past 2 months: Single Family Home                                       Social Drivers of Health (SDOH) Interventions SDOH Screenings   Food Insecurity: No Food Insecurity (01/22/2024)  Housing: High Risk (01/22/2024)  Transportation Needs: No Transportation Needs (01/22/2024)  Utilities: Not At Risk (01/22/2024)  Depression (PHQ2-9): Low Risk  (12/01/2020)  Social Connections: Socially Isolated (01/22/2024)  Tobacco Use: Low Risk  (02/19/2024)    Readmission Risk Interventions    06/27/2023    1:52 PM 10/14/2022   11:29 AM  Readmission Risk Prevention Plan  Post Dischage Appt  Complete  Medication Screening  Complete  Transportation Screening Complete Complete  HRI or Home Care Consult Complete   Social Work Consult for Recovery Care Planning/Counseling Complete   Palliative Care Screening Not Applicable   Medication Review Oceanographer) Referral to Pharmacy

## 2024-03-09 NOTE — Progress Notes (Signed)
 [name]   ROUNDING   NOTE Billy [name] FMW:[id number]  DOB: 06-30-1950  DOA: 01/21/2024  PCP: Seabron Lenis, MD  03/09/2024,6:11 PM  LOS: 48 days    Code Status: Full code     from: Home   73 year old male Prolonged admission 3/10-07/20/18/2025 cardiac arrest requiring ICU management cardioverted for A-fib-found to have DVTs then had hematochezia necessitating IVC filter-recurrent GI bleed secondary to erosive gastropathy and friable duodenal mucosa and colonoscopy was deferred and then chest tube placements cardiac cath at that time showed CAD with CTO of RCA and was treated with diuretics and sent home on torsemide  Entresto  Jardiance -  Readmitted 10/5 with nausea vomiting sudden tearing sensation found to have an esophageal tear was given Kcentra   Chronology  10/7 esophageal stent placed by cardiothoracic 10/15 stent reposition 10/16 extubated 10/20 reintubated with ultrasound showing left-sided pleural effusion-left-sided chest tube placed-by lights criteria exudative effusion 10/23 echo 60-65% 10/24 barium swallow showed persistent leak 10/28 transferred out of ICU 11/3 PEG tube placed 11/19 swelling of right arm showing on ultrasound SVT cephalic vein   Assessment  & Plan :    Boerhaave syndrome esophageal rupture-events as above-last esophagram 10/24 showed persistent leak IV Zosyn  continees-- Dr. Shyrl of CVTS aware re: stent removal Requires gastrostomy, continue feeds continue free water  100 every 6, IV Protonix  40 bd Do not change to bolus feeds--is having abd discomfort Has had some back pain ove rthe past 24 hours--is MSK to exam?---will get a cxr to ensure no free air or other findings  continue Dilaudid  0.5 Q3 as needed severe pain, 5 mg oxy IR for moderate pain Superficial venous thrombosis noted as above Only watchful waiting already on Eliquis   Hypoxic respiratory failure multifactorial with pneumonia pleural effusion CHF Last chest x-ray 11/8 shows removal of right pleural  catheter---Robitussin 5 q4prn levalbuterol  every 6 as needed re: symptoms Desat screen-PT is recommending skilled for rehabilitation  Paroxysmal A-fib with DCCV 07/06/2023 Amiodarone  200 twice daily with metoprolol  as needed IV for heart rate above 110 Converted to NSR ?de-escalation here off Amio post surgery 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  Adding scheduled hydralazine  10 tid CKD 3B Metabolic acidosis Labs are variable-no changes Multiple bedsores and wounds anterior right and left foot and injury stage I to bottom Continue Gerhardt twice daily Lidoderm  patch for back pain Can give Lomotil  1 tab every 4 as needed for diarrhea stool--separate order placed for scheduled Lomotil  and his diarrhea is improved  Data Reviewed today:   Sodium 138 potassium 4.5 CO2 21 BUN/creatinine 36/1.7 WBC 10.9 hemoglobin 9.9 platelet 238  DVT prophylaxis: DOAC  Status is: Inpatient Inpatient pending dysphagia stent removal    Dispo/Global plan: Inpatient   Time 70   Subjective:   Abdominal discomfort gone now having point tenderness in his upper scapula Overall is better but the tenderness bothered him overnight he could not sleep    Objective + exam Vitals:   03/09/24 0408 03/09/24 0749 03/09/24 1131 03/09/24 1535  BP: (!) 153/66 (!) 144/57 (!) 137/58 132/67  Pulse: 64 70  60  Resp: 20 18 14 19   Temp: 98.2 F (36.8 C) 98.1 F (36.7 C) 97.8 F (36.6 C) 98 F (36.7 C)  TempSrc: Oral Oral Oral  Oral  SpO2: 92%   96%  Weight: 78 kg     Height:       Filed Weights   03/07/24 0300 03/08/24 0413 03/09/24 0408  Weight: 73.7 kg 75.5 kg 78 kg     Examination:  EOMI NCAT slightly flat affect no icterus no pallor Neck soft supple Chest is clear to exam but in his upper back he does seem to have a knot near his upper scapula Abdomen is soft no rebound PEG tube is infusing feeds no longer having belly pain No lower extremity edema    Scheduled Meds:  amiodarone   200 mg Per Tube BID   apixaban   5 mg Per Tube BID   atorvastatin   80 mg Per Tube Daily   Chlorhexidine  Gluconate Cloth  6 each Topical Daily   diphenoxylate -atropine   1 tablet Per Tube BID   ezetimibe   10 mg Per Tube Daily   feeding supplement (PROSource TF20)  60 mL Per Tube Daily   fiber supplement (BANATROL TF)  60 mL Per Tube QID   free water   100 mL Per Tube Q6H   [name] butt cream   Topical BID   hydrALAZINE   100 mg Per Tube Q8H   insulin  aspart  0-9 Units Subcutaneous Q4H   isosorbide  dinitrate  10 mg Per Tube BID   lidocaine   1 patch Transdermal Q24H   losartan   25 mg Per Tube Daily   metoprolol  tartrate  6.25 mg Per Tube BID   pantoprazole  (PROTONIX ) IV  40 mg Intravenous Q12H   saccharomyces boulardii  250 mg Per Tube BID   sodium chloride  flush  10-40 mL Intracatheter Q12H   Continuous Infusions:  feeding supplement (KATE FARMS STANDARD ENT 1.4) 1,000 mL (03/09/24 1623)   piperacillin -tazobactam (ZOSYN )  IV 3.375 g (03/09/24 1417)   promethazine  (PHENERGAN ) injection (IM or IVPB) 150 mL/hr at 03/01/24 0348   acetaminophen  (TYLENOL ) oral liquid 160 mg/5 mL, alum & mag hydroxide-simeth, diclofenac  Sodium, [COMPLETED] diphenoxylate -atropine  **FOLLOWED BY** diphenoxylate -atropine , glucagon  (human recombinant), guaiFENesin , hydrALAZINE , hydrALAZINE , HYDROmorphone  (DILAUDID ) injection, levalbuterol , lip balm, methocarbamol  (ROBAXIN ) injection, metoprolol  tartrate, mouth rinse, oxyCODONE , promethazine   (PHENERGAN ) injection (IM or IVPB), simethicone , sodium chloride , sodium chloride  flush, traZODone   Jai-Gurmukh Parrish Bonn, MD  [facility]

## 2024-03-09 NOTE — Plan of Care (Signed)
   Problem: Education: Goal: Knowledge of General Education information will improve Description: Including pain rating scale, medication(s)/side effects and non-pharmacologic comfort measures Outcome: Progressing   Problem: Health Behavior/Discharge Planning: Goal: Ability to manage health-related needs will improve Outcome: Progressing   Problem: Clinical Measurements: Goal: Will remain free from infection Outcome: Progressing

## 2024-03-10 ENCOUNTER — Inpatient Hospital Stay (HOSPITAL_COMMUNITY)

## 2024-03-10 DIAGNOSIS — K223 Perforation of esophagus: Secondary | ICD-10-CM | POA: Diagnosis not present

## 2024-03-10 LAB — BASIC METABOLIC PANEL WITH GFR
Anion gap: 9 (ref 5–15)
BUN: 34 mg/dL — ABNORMAL HIGH (ref 8–23)
CO2: 21 mmol/L — ABNORMAL LOW (ref 22–32)
Calcium: 8.5 mg/dL — ABNORMAL LOW (ref 8.9–10.3)
Chloride: 112 mmol/L — ABNORMAL HIGH (ref 98–111)
Creatinine, Ser: 1.7 mg/dL — ABNORMAL HIGH (ref 0.61–1.24)
GFR, Estimated: 43 mL/min — ABNORMAL LOW (ref >60)
Glucose, Bld: 124 mg/dL — ABNORMAL HIGH (ref 70–99)
Potassium: 4.7 mmol/L (ref 3.5–5.1)
Sodium: 142 mmol/L (ref 135–145)

## 2024-03-10 LAB — GLUCOSE, CAPILLARY
Glucose-Capillary: 100 mg/dL — ABNORMAL HIGH (ref 70–99)
Glucose-Capillary: 113 mg/dL — ABNORMAL HIGH (ref 70–99)
Glucose-Capillary: 113 mg/dL — ABNORMAL HIGH (ref 70–99)
Glucose-Capillary: 116 mg/dL — ABNORMAL HIGH (ref 70–99)
Glucose-Capillary: 122 mg/dL — ABNORMAL HIGH (ref 70–99)
Glucose-Capillary: 133 mg/dL — ABNORMAL HIGH (ref 70–99)

## 2024-03-10 LAB — CBC WITH DIFFERENTIAL/PLATELET
Abs Immature Granulocytes: 0.15 K/uL — ABNORMAL HIGH (ref 0.00–0.07)
Basophils Absolute: 0.1 K/uL (ref 0.0–0.1)
Basophils Relative: 1 %
Eosinophils Absolute: 0.2 K/uL (ref 0.0–0.5)
Eosinophils Relative: 2 %
HCT: 32.1 % — ABNORMAL LOW (ref 39.0–52.0)
Hemoglobin: 9.9 g/dL — ABNORMAL LOW (ref 13.0–17.0)
Immature Granulocytes: 2 %
Lymphocytes Relative: 7 %
Lymphs Abs: 0.6 K/uL — ABNORMAL LOW (ref 0.7–4.0)
MCH: 28 pg (ref 26.0–34.0)
MCHC: 30.8 g/dL (ref 30.0–36.0)
MCV: 90.9 fL (ref 80.0–100.0)
Monocytes Absolute: 0.6 K/uL (ref 0.1–1.0)
Monocytes Relative: 7 %
Neutro Abs: 8 K/uL — ABNORMAL HIGH (ref 1.7–7.7)
Neutrophils Relative %: 81 %
Platelets: 213 K/uL (ref 150–400)
RBC: 3.53 MIL/uL — ABNORMAL LOW (ref 4.22–5.81)
RDW: 18 % — ABNORMAL HIGH (ref 11.5–15.5)
WBC: 9.6 K/uL (ref 4.0–10.5)
nRBC: 0 % (ref 0.0–0.2)

## 2024-03-10 MED ORDER — FUROSEMIDE 40 MG PO TABS
40.0000 mg | ORAL_TABLET | Freq: Every day | ORAL | Status: AC
  Administered 2024-03-10 – 2024-03-11 (×2): 40 mg
  Filled 2024-03-10 (×2): qty 1

## 2024-03-10 MED ORDER — CYCLOBENZAPRINE HCL 10 MG PO TABS
5.0000 mg | ORAL_TABLET | Freq: Three times a day (TID) | ORAL | Status: DC | PRN
  Administered 2024-03-10 – 2024-03-26 (×12): 5 mg
  Filled 2024-03-10 (×12): qty 1

## 2024-03-10 MED ORDER — SODIUM CHLORIDE (PF) 0.9 % IJ SOLN
INTRAMUSCULAR | Status: AC
Start: 2024-03-10 — End: 2024-03-10
  Administered 2024-03-10: 10 mL
  Filled 2024-03-10: qty 10

## 2024-03-10 NOTE — Plan of Care (Signed)
  Problem: Education: Goal: Knowledge of General Education information will improve Description: Including pain rating scale, medication(s)/side effects and non-pharmacologic comfort measures Outcome: Progressing   Problem: Health Behavior/Discharge Planning: Goal: Ability to manage health-related needs will improve Outcome: Progressing   Problem: Clinical Measurements: Goal: Will remain free from infection Outcome: Progressing Goal: Diagnostic test results will improve Outcome: Progressing Goal: Respiratory complications will improve Outcome: Progressing Goal: Cardiovascular complication will be avoided Outcome: Progressing   Problem: Activity: Goal: Risk for activity intolerance will decrease Outcome: Progressing   Problem: Nutrition: Goal: Adequate nutrition will be maintained Outcome: Progressing   Problem: Coping: Goal: Level of anxiety will decrease Outcome: Progressing   Problem: Elimination: Goal: Will not experience complications related to bowel motility Outcome: Progressing Goal: Will not experience complications related to urinary retention Outcome: Progressing   Problem: Pain Managment: Goal: General experience of comfort will improve and/or be controlled Outcome: Progressing   Problem: Safety: Goal: Ability to remain free from injury will improve Outcome: Progressing   Problem: Skin Integrity: Goal: Risk for impaired skin integrity will decrease Outcome: Progressing   Problem: Education: Goal: Ability to describe self-care measures that may prevent or decrease complications (Diabetes Survival Skills Education) will improve Outcome: Progressing   Problem: Coping: Goal: Ability to adjust to condition or change in health will improve Outcome: Progressing   Problem: Fluid Volume: Goal: Ability to maintain a balanced intake and output will improve Outcome: Progressing   Problem: Health Behavior/Discharge Planning: Goal: Ability to identify and  utilize available resources and services will improve Outcome: Progressing Goal: Ability to manage health-related needs will improve Outcome: Progressing   Problem: Metabolic: Goal: Ability to maintain appropriate glucose levels will improve Outcome: Progressing   Problem: Nutritional: Goal: Maintenance of adequate nutrition will improve Outcome: Progressing Goal: Progress toward achieving an optimal weight will improve Outcome: Progressing   Problem: Skin Integrity: Goal: Risk for impaired skin integrity will decrease Outcome: Progressing   Problem: Tissue Perfusion: Goal: Adequacy of tissue perfusion will improve Outcome: Progressing   Problem: Education: Goal: Ability to describe self-care measures that may prevent or decrease complications (Diabetes Survival Skills Education) will improve Outcome: Progressing Goal: Individualized Educational Video(s) Outcome: Progressing   Problem: Coping: Goal: Ability to adjust to condition or change in health will improve Outcome: Progressing   Problem: Fluid Volume: Goal: Ability to maintain a balanced intake and output will improve Outcome: Progressing   Problem: Health Behavior/Discharge Planning: Goal: Ability to identify and utilize available resources and services will improve Outcome: Progressing Goal: Ability to manage health-related needs will improve Outcome: Progressing   Problem: Metabolic: Goal: Ability to maintain appropriate glucose levels will improve Outcome: Progressing   Problem: Nutritional: Goal: Maintenance of adequate nutrition will improve Outcome: Progressing Goal: Progress toward achieving an optimal weight will improve Outcome: Progressing

## 2024-03-10 NOTE — Progress Notes (Signed)
 [name]   ROUNDING   NOTE Rutledge Selsor FMW:[id number]  DOB: 1951-02-16  DOA: 01/21/2024  PCP: Seabron Lenis, MD  03/10/2024,5:05 PM  LOS: 49 days    Code Status: Full code     from: Home   73 year old male Prolonged admission 3/10-07/20/18/2025 cardiac arrest requiring ICU management cardioverted for A-fib-found to have DVTs then had hematochezia necessitating IVC filter-recurrent GI bleed secondary to erosive gastropathy and friable duodenal mucosa and colonoscopy was deferred and then chest tube placements cardiac cath at that time showed CAD with CTO of RCA and was treated with diuretics and sent home on torsemide  Entresto  Jardiance -  Readmitted 10/5 with nausea vomiting sudden tearing sensation found to have an esophageal tear was given Kcentra   Chronology  10/7 esophageal stent placed by cardiothoracic 10/15 stent reposition 10/16 extubated 10/20 reintubated with ultrasound showing left-sided pleural effusion-left-sided chest tube placed-by lights criteria exudative effusion 10/23 echo 60-65% 10/24 barium swallow showed persistent leak 10/28 transferred out of ICU 11/3 PEG tube placed 11/19 swelling of right arm showing on ultrasound SVT cephalic vein 11/22 chest x-ray?  Pneumonia done because of back pain-follow-up CT scan shows stent in place no mediastinal fluid collection small bilateral pleural effusions with bibasilar atelectasis pneumonia not excluded-scattered clusters nodular densities predominantly right upper and right lower lobes with nodule 11 mm as previously seen?  Mets?  Infection?  Aspiration   Assessment  & Plan :    Boerhaave syndrome esophageal rupture-events as above-last esophagram 10/24 showed persistent leak IV Zosyn  continees-- Dr. Shyrl of CVTS aware re: stent removal Requires gastrostomy, continue feeds continue free water  100 every 6, IV Protonix  40 bd Do not change to bolus feeds--is having abd discomfort--continue continuous feeds at 65 cc/8 as well as free  water  100 every 6 Can discuss feeds and bolus feeds once we have a clear plan regarding stent replacement/Removal/ from Dr. Shyrl Continue IV Protonix  40 twice daily continue Maalox 30 mL every 4 as needed heart 11/22 imaging fluid versus pneumonia but is already on Zosyn  not sure if it is malignancy will ask CVTS to review Oxy IR 5 q4 moderate, IV Dilaudid  0.5 q3 prn severe pain Upper back pain Musculoskeletal in nature imaging done as above 11/22 nonspecific Continue Flexeril  5 3 times daily as needed-mobilize as best able Superficial venous thrombosis noted as above Only watchful waiting already on Eliquis   Hypoxic respiratory failure multifactorial with pneumonia pleural effusion CHF See above discussion-Lasix  40 by tube daily for 2 days and reevaluate Already on Zosyn  Continue Xopenex  inhalers Mucinex  Paroxysmal A-fib with DCCV 07/06/2023 Amiodarone  200 twice daily with metoprolol  as needed IV for heart rate above 110 Converted to NSR 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 Taking Eliquis  5 twice daily via tube-suspect will need longer-term anticoagulation Cardiac arrest 06/2023 Uncontrolled blood pressure--continue scheduled hydralazine  10 every 8 Continue atorvastatin  80 Zetia  10 Isordil  10 twice daily-metoprolol  6.25 twice daily losartan  25 daily additionally-periodic labs Bipolar Insomnia May continue trazodone  50-aim to wean at discharge Protein energy malnutrition BMI 21 Supplements as below as above try to prevent diarrhea CKD 3B Metabolic acidosis Labs are improving-watch with addition of Lasix  Multiple bedsores and wounds anterior right and left foot and injury stage I to bottom Continue Gerhardt twice daily Lidoderm  patch for back pain Schedule Lomotil  1 tablet twice daily, can use as needed Lomotil  4 times daily in addition   Data Reviewed today:   Sodium 142 potassium 4.7 BUN/creatinine 34/1.7 WBC 9.6 hemoglobin 9 platelet 213  Chest x-ray and CT  imaging from 11/22 as above  DVT prophylaxis: DOAC  Status is: Inpatient Inpatient pending dysphagia stent removal    Dispo/Global plan: Inpatient   Time 20   Subjective:   Overall about the same still a little bit uncomfortable feeling swollen in his left arm additionally no current chest pain no back pain no fever no chills I reviewed the CT and chest x-ray Overall he is unchanged with no abdominal pain and we are waiting for the stent to be removed    Objective + exam Vitals:   03/10/24 0549 03/10/24 0744 03/10/24 1156 03/10/24 1508  BP:  138/60 132/65 (!) 148/58  Pulse: 61 64 63 61  Resp: 19 19 19 19   Temp:  97.9 F (36.6 C) 98 F (36.7 C) 98.6 F (37 C)  TempSrc:  Oral Oral Oral  SpO2: 97% 97% 97%   Weight:      Height:       Filed Weights   03/08/24 0413 03/09/24 0408 03/10/24 0403  Weight: 75.5 kg 78 kg 78.6 kg     Examination:  EOMI NCAT slightly flat affect no icterus no pallor Neck soft supple Chest is clear to exam --- musculoskeletal pain seems improved Decreased air entry posteriorly PEG tube in place abdomen soft Swelling of left arm noted  Scheduled Meds:  amiodarone   200 mg Per Tube BID   apixaban   5 mg Per Tube BID   atorvastatin   80 mg Per Tube Daily   Chlorhexidine  Gluconate Cloth  6 each Topical Daily   diphenoxylate -atropine   1 tablet Per Tube BID   ezetimibe   10 mg Per Tube Daily   feeding supplement (PROSource TF20)  60 mL Per Tube Daily   fiber supplement (BANATROL TF)  60 mL Per Tube QID   free water   100 mL Per Tube Q6H   furosemide   40 mg Per Tube Daily   [name] butt cream   Topical BID   hydrALAZINE   100 mg Per Tube Q8H   insulin  aspart  0-9 Units Subcutaneous Q4H   isosorbide  dinitrate  10 mg Per Tube BID   lidocaine   1 patch Transdermal Q24H   losartan   25 mg Per Tube Daily   metoprolol  tartrate  6.25 mg Per Tube BID   pantoprazole  (PROTONIX ) IV  40 mg Intravenous Q12H   saccharomyces boulardii  250 mg Per Tube BID    sodium chloride  flush  10-40 mL Intracatheter Q12H   Continuous Infusions:  feeding supplement ([place] STANDARD ENT 1.4) 1,000 mL (03/10/24 0948)   piperacillin -tazobactam (ZOSYN )  IV 3.375 g (03/10/24 1327)   promethazine  (PHENERGAN ) injection (IM or IVPB) 150 mL/hr at 03/10/24 9385   acetaminophen  (TYLENOL ) oral liquid 160 mg/5 mL, alum & mag hydroxide-simeth, cyclobenzaprine , diclofenac  Sodium, [COMPLETED] diphenoxylate -atropine  **FOLLOWED BY** diphenoxylate -atropine , glucagon  (human recombinant), guaiFENesin , hydrALAZINE , hydrALAZINE , HYDROmorphone  (DILAUDID ) injection, levalbuterol , lip balm, methocarbamol  (ROBAXIN ) injection, metoprolol  tartrate, mouth rinse, oxyCODONE , promethazine  (PHENERGAN ) injection (IM or IVPB), simethicone , sodium chloride , sodium chloride  flush, traZODone   Colen Grimes, MD  [facility]

## 2024-03-10 NOTE — Plan of Care (Signed)
  Problem: Clinical Measurements: Goal: Will remain free from infection Outcome: Progressing   Problem: Health Behavior/Discharge Planning: Goal: Ability to manage health-related needs will improve Outcome: Progressing   Problem: Clinical Measurements: Goal: Diagnostic test results will improve Outcome: Progressing

## 2024-03-11 DIAGNOSIS — K223 Perforation of esophagus: Secondary | ICD-10-CM | POA: Diagnosis not present

## 2024-03-11 LAB — CBC WITH DIFFERENTIAL/PLATELET
Abs Immature Granulocytes: 0.1 K/uL — ABNORMAL HIGH (ref 0.00–0.07)
Basophils Absolute: 0.1 K/uL (ref 0.0–0.1)
Basophils Relative: 1 %
Eosinophils Absolute: 0.2 K/uL (ref 0.0–0.5)
Eosinophils Relative: 2 %
HCT: 33.3 % — ABNORMAL LOW (ref 39.0–52.0)
Hemoglobin: 10.4 g/dL — ABNORMAL LOW (ref 13.0–17.0)
Immature Granulocytes: 1 %
Lymphocytes Relative: 8 %
Lymphs Abs: 0.7 K/uL (ref 0.7–4.0)
MCH: 28.2 pg (ref 26.0–34.0)
MCHC: 31.2 g/dL (ref 30.0–36.0)
MCV: 90.2 fL (ref 80.0–100.0)
Monocytes Absolute: 0.7 K/uL (ref 0.1–1.0)
Monocytes Relative: 7 %
Neutro Abs: 7.5 K/uL (ref 1.7–7.7)
Neutrophils Relative %: 81 %
Platelets: 208 K/uL (ref 150–400)
RBC: 3.69 MIL/uL — ABNORMAL LOW (ref 4.22–5.81)
RDW: 17.9 % — ABNORMAL HIGH (ref 11.5–15.5)
WBC: 9.2 K/uL (ref 4.0–10.5)
nRBC: 0 % (ref 0.0–0.2)

## 2024-03-11 LAB — GLUCOSE, CAPILLARY
Glucose-Capillary: 108 mg/dL — ABNORMAL HIGH (ref 70–99)
Glucose-Capillary: 116 mg/dL — ABNORMAL HIGH (ref 70–99)
Glucose-Capillary: 125 mg/dL — ABNORMAL HIGH (ref 70–99)
Glucose-Capillary: 130 mg/dL — ABNORMAL HIGH (ref 70–99)
Glucose-Capillary: 135 mg/dL — ABNORMAL HIGH (ref 70–99)
Glucose-Capillary: 99 mg/dL (ref 70–99)

## 2024-03-11 LAB — COMPREHENSIVE METABOLIC PANEL WITH GFR
ALT: 51 U/L — ABNORMAL HIGH (ref 0–44)
AST: 30 U/L (ref 15–41)
Albumin: 1.7 g/dL — ABNORMAL LOW (ref 3.5–5.0)
Alkaline Phosphatase: 70 U/L (ref 38–126)
Anion gap: 10 (ref 5–15)
BUN: 34 mg/dL — ABNORMAL HIGH (ref 8–23)
CO2: 21 mmol/L — ABNORMAL LOW (ref 22–32)
Calcium: 8.1 mg/dL — ABNORMAL LOW (ref 8.9–10.3)
Chloride: 110 mmol/L (ref 98–111)
Creatinine, Ser: 1.63 mg/dL — ABNORMAL HIGH (ref 0.61–1.24)
GFR, Estimated: 45 mL/min — ABNORMAL LOW (ref >60)
Glucose, Bld: 102 mg/dL — ABNORMAL HIGH (ref 70–99)
Potassium: 4.2 mmol/L (ref 3.5–5.1)
Sodium: 141 mmol/L (ref 135–145)
Total Bilirubin: 0.4 mg/dL (ref 0.0–1.2)
Total Protein: 5.3 g/dL — ABNORMAL LOW (ref 6.5–8.1)

## 2024-03-11 MED ORDER — SODIUM CHLORIDE (PF) 0.9 % IJ SOLN
INTRAMUSCULAR | Status: AC
Start: 2024-03-11 — End: 2024-03-11
  Administered 2024-03-11: 10 mL
  Filled 2024-03-11: qty 10

## 2024-03-11 MED ORDER — FUROSEMIDE 20 MG PO TABS
20.0000 mg | ORAL_TABLET | Freq: Every day | ORAL | Status: AC
  Administered 2024-03-12: 20 mg
  Filled 2024-03-11: qty 1

## 2024-03-11 NOTE — Progress Notes (Signed)
 Occupational Therapy Treatment Patient Details Name: Billy [name] MRN: [id number] DOB: 02/17/1951 Today's Date: 03/11/2024   History of present illness Billy [name] is a 73 y.o. male who presented to [facility] ED 01/21/24 with several days of vomiting followed by an episode of severe tearing epigastric pain. Work-up revealed esophageal tear. Pt s/p esophageal stent with EGD 10/7. Return to OR for esophageal stent adjustment on 10/15, remained intubated after procedure due to suspected aspiration. Extubated 10/16.  Pt had CT placed 10/16. Reintubated 10/20-10/24. G tube placed 11/3. He accidentally pulled out the R chest tube on 11/7. Found to have superficial venous thrombosis of RUE 11/19. PMHx: HFrEF, AFib, CAD, PE complicated by cardiogenic shock, CKD 4, MI, peripheral neuropathy, and TIA. L chest tube removed 11/11   OT comments  Pt making steady progress toward goals. Overall min A with mobility and mod A with ADL tasks @ RW level due to below deficits. Continue to recommend continued inpatient follow up therapy, <3 hours/day to maximize functional level of independence with goal of returning home. VSS on RA. Acute OT to follow.       If plan is discharge home, recommend the following:  Assistance with cooking/housework;Direct supervision/assist for medications management;Direct supervision/assist for financial management;Assist for transportation;Help with stairs or ramp for entrance;A lot of help with bathing/dressing/bathroom;A lot of help with walking and/or transfers   Equipment Recommendations  None recommended by OT    Recommendations for Other Services      Precautions / Restrictions Precautions Precautions: Fall Recall of Precautions/Restrictions: Impaired Precaution/Restrictions Comments: G-tube       Mobility Bed Mobility Overal bed mobility: Needs Assistance Bed Mobility: Sit to Supine       Sit to supine: Contact guard assist        Transfers Overall transfer level:  Needs assistance Equipment used: Rolling walker (2 wheels) Transfers: Sit to/from Stand, Bed to chair/wheelchair/BSC Sit to Stand: Contact guard assist     Step pivot transfers: Min assist           Balance     Sitting balance-Leahy Scale: Good       Standing balance-Leahy Scale: Poor                             ADL either performed or assessed with clinical judgement   ADL       Grooming: Set up;Sitting   Upper Body Bathing: Set up;Supervision/ safety;Sitting               Toilet Transfer: Ambulation;Minimal assistance;BSC/3in1;Rolling walker (2 wheels)   Toileting- Clothing Manipulation and Hygiene: Moderate assistance       Functional mobility during ADLs: Minimal assistance;Rolling walker (2 wheels);Cueing for safety      Extremity/Trunk Assessment Upper Extremity Assessment Upper Extremity Assessment: Generalized weakness   Lower Extremity Assessment Lower Extremity Assessment: Defer to PT evaluation        Vision       Perception     Praxis     Communication Communication Communication: Impaired Factors Affecting Communication: Hearing impaired   Cognition Arousal: Alert Behavior During Therapy: Flat affect Cognition: Cognition impaired   Orientation impairments: Time Awareness: Online awareness impaired, Intellectual awareness intact Memory impairment (select all impairments): Working civil service fast streamer, Conservation officer, historic buildings Attention impairment (select first level of impairment): Selective attention Executive functioning impairment (select all impairments): Problem solving, Reasoning  Following commands: Impaired        Cueing      Exercises      Shoulder Instructions       General Comments Unable to complete entire DGI but out of a possible 18 points pt scored a 6, indicating a high fall risk. Pt requires x2 seated rest breaks during gait and does not initiate breaks when needed. Pt made  improvements with cognitive challenges with less frustration today.    Pertinent Vitals/ Pain       Pain Assessment Pain Assessment: Faces Faces Pain Scale: Hurts a little bit Pain Location: Abdominal area, neck, back Pain Descriptors / Indicators: Discomfort, Grimacing, Guarding Pain Intervention(s): Limited activity within patient's tolerance  Home Living                                          Prior Functioning/Environment              Frequency  Min 2X/week        Progress Toward Goals  OT Goals(current goals can now be found in the care plan section)  Progress towards OT goals: Progressing toward goals  Acute Rehab OT Goals OT Goal Formulation: With patient Time For Goal Achievement: 03/19/24 Potential to Achieve Goals: Fair ADL Goals Pt Will Perform Grooming: with supervision;standing Pt Will Perform Lower Body Bathing: with supervision;sit to/from stand Pt Will Perform Upper Body Dressing: with min assist;sitting Pt Will Perform Lower Body Dressing: with supervision;sit to/from stand;sitting/lateral leans Pt Will Transfer to Toilet: with contact guard assist;ambulating;regular height toilet Pt Will Perform Toileting - Clothing Manipulation and hygiene: sitting/lateral leans;sit to/from stand;with supervision Pt/caregiver will Perform Home Exercise Program: Increased strength;Both right and left upper extremity;With theraband Additional ADL Goal #1: pt will tolerate standing functional activity x10 min in prep for OOB ADLs Additional ADL Goal #2: Pt to increase standing tolerance > 8 min during ADLs/mobility without need for seated rest break  Plan      Co-evaluation                 AM-PAC OT 6 Clicks Daily Activity     Outcome Measure   Help from another person eating meals?: Total (G tube) Help from another person taking care of personal grooming?: A Little Help from another person toileting, which includes using toliet,  bedpan, or urinal?: A Lot Help from another person bathing (including washing, rinsing, drying)?: A Lot Help from another person to put on and taking off regular upper body clothing?: A Little Help from another person to put on and taking off regular lower body clothing?: A Lot 6 Click Score: 13    End of Session Equipment Utilized During Treatment: Gait belt;Rolling walker (2 wheels)  OT Visit Diagnosis: Muscle weakness (generalized) (M62.81);Other symptoms and signs involving cognitive function;Unsteadiness on feet (R26.81) Pain - part of body:  (abdomen)   Activity Tolerance Patient tolerated treatment well   Patient Left in bed;with call bell/phone within reach;with bed alarm set;with nursing/sitter in room   Nurse Communication Mobility status        Time: 8684-8660 OT Time Calculation (min): 24 min  Charges: OT General Charges $OT Visit: 1 Visit OT Treatments $Self Care/Home Management : 23-37 mins  Kreg Sink, OT/L   Acute OT Clinical Specialist Acute Rehabilitation Services Pager [phone] Office [phone]   [facility] 03/11/2024, 2:05 PM

## 2024-03-11 NOTE — Progress Notes (Addendum)
 Nutrition Brief Note  Notable chest xray on 11/22. Concern for aspiration PNA vs mets. CVTS to review, per attending. Patient did not tolerate transition to bolus feedings. Remains on continuous feeds. Tentatively scheduled for stent removal today. No N/V or abdominal pain noted/reported. One BM documented yesterday. Lomitil scheduled BID with PRN up to four times a day ordered.   Will continue to monitor tolerance. When appropriate, can consider transition to nocturnal feedings or intermittent feedings via pump to allow some time off pump to participate with therapies. Will continue to monitor.   INTERVENTION:  Continue TF via PEG: [place] Standard 1.4 to 65 ml/h (1560 ml per day) -Prosource TF20 60ml daily - FWF: 100 mL q6h (400 mL/day) -Provides 2264 kcal, 117 gm protein, 1508 ml free water  daily    Contnue Banatrol QID -provides 45kcal, 5g soluble fiber per serving Continue Florastor BID for re-establishing gut microbiome    NUTRITION DIAGNOSIS:  Severe Malnutrition related to acute illness (may have chronic component due to time frame of reported wt loss) as evidenced by severe fat depletion, moderate muscle depletion. - remains applicable   GOAL:  Patient will meet greater than or equal to 90% of their needs - being met via TF  Blair Deaner MS, RD, LDN Registered Dietitian Clinical Nutrition RD Inpatient Contact Info in Amion

## 2024-03-11 NOTE — Progress Notes (Addendum)
 PT Cancellation Note  Patient Details Name: Billy [name] MRN: [id number] DOB: 04/06/1951   Cancelled Treatment:    Reason Eval/Treat Not Completed: (P) Other (comment) (Pt receiving bed bath per NT) Plan to return this afternoon as schedule permits.    Lilyann Gravelle, SPTA 03/11/2024, 11:40 AM

## 2024-03-11 NOTE — TOC Progression Note (Signed)
 Transition of Care [facility]) - Progression Note    Patient Details  Name: Billy [name] MRN: [id number] Date of Birth: 08-19-1950  Transition of [facility]) CM/SW Contact  Lauraine FORBES Saa, LCSWA Phone Number: 03/11/2024, 2:01 PM  Clinical Narrative:     2:01 PM Per progressions, patient does not have orders for stent removal. CSW will submit SNF insurance authorization for [facility] upon removal. CSW will continue to follow.  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, SNF Pending bed offer               Expected Discharge Plan and Services In-house Referral: Clinical Social Work Discharge Planning Services: [employer] Consult Post Acute Care Choice: Skilled Nursing Facility Living arrangements for the past 2 months: Single Family Home                                       Social Drivers of Health (SDOH) Interventions SDOH Screenings   Food Insecurity: No Food Insecurity (01/22/2024)  Housing: High Risk (01/22/2024)  Transportation Needs: No Transportation Needs (01/22/2024)  Utilities: Not At Risk (01/22/2024)  Depression (PHQ2-9): Low Risk  (12/01/2020)  Social Connections: Socially Isolated (01/22/2024)  Tobacco Use: Low Risk  (02/19/2024)    Readmission Risk Interventions    06/27/2023    1:52 PM 10/14/2022   11:29 AM  Readmission Risk Prevention Plan  Post Dischage Appt  Complete  Medication Screening  Complete  Transportation Screening Complete Complete  HRI or Home Care Consult Complete   Social Work Consult for Recovery Care Planning/Counseling Complete   Palliative Care Screening Not Applicable   Medication Review Oceanographer) Referral to Pharmacy

## 2024-03-11 NOTE — Plan of Care (Signed)
  Problem: Education: Goal: Knowledge of General Education information will improve Description: Including pain rating scale, medication(s)/side effects and non-pharmacologic comfort measures Outcome: Progressing   Problem: Health Behavior/Discharge Planning: Goal: Ability to manage health-related needs will improve Outcome: Progressing   Problem: Clinical Measurements: Goal: Will remain free from infection Outcome: Progressing Goal: Diagnostic test results will improve Outcome: Progressing Goal: Respiratory complications will improve Outcome: Progressing Goal: Cardiovascular complication will be avoided Outcome: Progressing   Problem: Activity: Goal: Risk for activity intolerance will decrease Outcome: Progressing   Problem: Nutrition: Goal: Adequate nutrition will be maintained Outcome: Progressing   Problem: Coping: Goal: Level of anxiety will decrease Outcome: Progressing   Problem: Elimination: Goal: Will not experience complications related to bowel motility Outcome: Progressing Goal: Will not experience complications related to urinary retention Outcome: Progressing   Problem: Pain Managment: Goal: General experience of comfort will improve and/or be controlled Outcome: Progressing   Problem: Safety: Goal: Ability to remain free from injury will improve Outcome: Progressing   Problem: Skin Integrity: Goal: Risk for impaired skin integrity will decrease Outcome: Progressing   Problem: Education: Goal: Ability to describe self-care measures that may prevent or decrease complications (Diabetes Survival Skills Education) will improve Outcome: Progressing   Problem: Fluid Volume: Goal: Ability to maintain a balanced intake and output will improve Outcome: Progressing   Problem: Health Behavior/Discharge Planning: Goal: Ability to identify and utilize available resources and services will improve Outcome: Progressing Goal: Ability to manage health-related  needs will improve Outcome: Progressing   Problem: Metabolic: Goal: Ability to maintain appropriate glucose levels will improve Outcome: Progressing   Problem: Nutritional: Goal: Maintenance of adequate nutrition will improve Outcome: Progressing Goal: Progress toward achieving an optimal weight will improve Outcome: Progressing   Problem: Skin Integrity: Goal: Risk for impaired skin integrity will decrease Outcome: Progressing   Problem: Tissue Perfusion: Goal: Adequacy of tissue perfusion will improve Outcome: Progressing   Problem: Education: Goal: Ability to describe self-care measures that may prevent or decrease complications (Diabetes Survival Skills Education) will improve Outcome: Progressing Goal: Individualized Educational Video(s) Outcome: Progressing   Problem: Coping: Goal: Ability to adjust to condition or change in health will improve Outcome: Progressing   Problem: Fluid Volume: Goal: Ability to maintain a balanced intake and output will improve Outcome: Progressing   Problem: Health Behavior/Discharge Planning: Goal: Ability to identify and utilize available resources and services will improve Outcome: Progressing Goal: Ability to manage health-related needs will improve Outcome: Progressing   Problem: Metabolic: Goal: Ability to maintain appropriate glucose levels will improve Outcome: Progressing   Problem: Nutritional: Goal: Maintenance of adequate nutrition will improve Outcome: Progressing Goal: Progress toward achieving an optimal weight will improve Outcome: Progressing   Problem: Skin Integrity: Goal: Risk for impaired skin integrity will decrease Outcome: Progressing

## 2024-03-11 NOTE — Progress Notes (Addendum)
 Physical Therapy Treatment Patient Details Name: Ceejay Kegley MRN: [id number] DOB: [birth year] Today's Date: 03/11/2024   History of Present Illness Newman Waren is a 73 y.o. male who presented to [facility] ED 01/21/24 with several days of vomiting followed by an episode of severe tearing epigastric pain. Work-up revealed esophageal tear. Pt s/p esophageal stent with EGD 10/7. Return to OR for esophageal stent adjustment on 10/15, remained intubated after procedure due to suspected aspiration. Extubated 10/16.  Pt had CT placed 10/16. Reintubated 10/20-10/24. G tube placed 11/3. He accidentally pulled out the R chest tube on 11/7. Found to have superficial venous thrombosis of RUE 11/19. PMHx: HFrEF, AFib, CAD, PE complicated by cardiogenic shock, CKD 4, MI, peripheral neuropathy, and TIA. L chest tube removed 11/11    PT Comments  Pt received in supine A&Ox4, agreeable to work with SPTA. Pt requires up to min A to stand and perform step x3 with instability and lack of safety awareness, CGA with gait with chair follow. Pt lacks initiative to take rest breaks when needed, required x2 seated breaks during gait trial today. After being reminded of room number pt was able to wayfind back to his room without frustration. Pt SpO2 signal spotty, around 94% and higher when reading correctly. Attempted DGI today, unable to complete all aspects but out of a possible 18 points pt scored a 6, indicating a high fall risk. Patient will benefit from continued inpatient follow up therapy, <3 hours/day.    If plan is discharge home, recommend the following: A little help with walking and/or transfers;A little help with bathing/dressing/bathroom;Assistance with cooking/housework;Direct supervision/assist for medications management;Assist for transportation;Help with stairs or ramp for entrance   Can travel by private vehicle     Yes  Equipment Recommendations  Wheelchair (measurements PT);Wheelchair cushion (measurements  PT);Rolling walker (2 wheels)    Recommendations for Other Services Rehab consult     Precautions / Restrictions Precautions Precautions: Fall Recall of Precautions/Restrictions: Impaired Precaution/Restrictions Comments: G-tube Restrictions Weight Bearing Restrictions Per Provider Order: No     Mobility  Bed Mobility Overal bed mobility: Needs Assistance Bed Mobility: Supine to Sit     Supine to sit: Contact guard, HOB elevated, Used rails     General bed mobility comments: Pt able to get EOB with assist for line mgmt    Transfers Overall transfer level: Needs assistance Equipment used: Rolling walker (2 wheels) Transfers: Sit to/from Stand, Bed to chair/wheelchair/BSC Sit to Stand: Min assist (Pt CGA at times but with instability, fatigue, and unsafeness can require up to min A.)   Step pivot transfers: Contact guard assist       General transfer comment: quickly stands and grabs to RW, unsteady upon standing, cues to reach back required and bring the walker closer to surface before reaching back    Ambulation/Gait Ambulation/Gait assistance: +2 safety/equipment, Contact guard assist (Chair follow) Gait Distance (Feet): 100 Feet (x2 +39ft with 2 seated rest breaks lasting 2-3 minutes to recover.) Assistive device: Rolling walker (2 wheels) Gait Pattern/deviations: Step-through pattern, Trunk flexed Gait velocity: reduced     General Gait Details: Pt lacks intitation to take rest breaks when needed. Pt made improvements with wayfinding today and found his room number x2 during session after being reminded what it was. Pt was not agitated during cognitive challenges with gait today.   Stairs Stairs: Yes Stairs assistance: Min assist Stair Management: Two rails, Backwards, Forwards, Step to pattern Number of Stairs: 3 General stair comments: Pt performed stairs with  poor posture and unsteady use of RW for rails. Pt fatigues quickly.   Wheelchair Mobility      Tilt Bed    Modified Rankin (Stroke Patients Only)       Balance Overall balance assessment: Needs assistance Sitting-balance support: Feet supported, No upper extremity supported Sitting balance-Leahy Scale: Good Sitting balance - Comments: EOB   Standing balance support: Reliant on assistive device for balance, During functional activity, Bilateral upper extremity supported Standing balance-Leahy Scale: Poor Standing balance comment: Dependent on RW                 Standardized Balance Assessment Standardized Balance Assessment : Dynamic Gait Index   Dynamic Gait Index Level Surface: Mild Impairment Change in Gait Speed: Moderate Impairment Gait with Horizontal Head Turns: Moderate Impairment Gait with Vertical Head Turns: Severe Impairment Gait and Pivot Turn: Moderate Impairment Steps: Moderate Impairment      Communication Communication Communication: Impaired Factors Affecting Communication: Hearing impaired  Cognition Arousal: Alert Behavior During Therapy: Flat affect   PT - Cognitive impairments: Attention, Awareness, Problem solving, Safety/Judgement                       PT - Cognition Comments: Pt did not recall his room number but once told, he was able to find his room. Pt A&Ox4. Following commands: Impaired Following commands impaired: Follows one step commands with increased time, Only follows one step commands consistently    Cueing Cueing Techniques: Verbal cues, Gestural cues, Visual cues  Exercises      General Comments General comments (skin integrity, edema, etc.): Unable to complete entire DGI but out of a possible 18 points pt scored a 6, indicating a high fall risk. Pt requires x2 seated rest breaks during gait and does not initiate breaks when needed. Pt made improvements with cognitive challenges with less frustration today.      Pertinent Vitals/Pain Pain Assessment Pain Assessment: 0-10 Pain Score: 8  Faces Pain  Scale: Hurts little more Breathing: occasional labored breathing, short period of hyperventilation Negative Vocalization: occasional moan/groan, low speech, negative/disapproving quality Facial Expression: sad, frightened, frown Body Language: tense, distressed pacing, fidgeting Consolability: distracted or reassured by voice/touch PAINAD Score: 5 Pain Location: Abdominal area, neck, back Pain Descriptors / Indicators: Discomfort, Grimacing, Guarding Pain Intervention(s): Monitored during session, Limited activity within patient's tolerance, Repositioned    Home Living                          Prior Function            PT Goals (current goals can now be found in the care plan section) Acute Rehab PT Goals Patient Stated Goal: To get stronger before I go home PT Goal Formulation: With patient Time For Goal Achievement: 03/15/24 Progress towards PT goals: Progressing toward goals    Frequency    Min 2X/week      PT Plan      Co-evaluation              AM-PAC PT 6 Clicks Mobility   Outcome Measure  Help needed turning from your back to your side while in a flat bed without using bedrails?: A Little Help needed moving from lying on your back to sitting on the side of a flat bed without using bedrails?: A Little Help needed moving to and from a bed to a chair (including a wheelchair)?: A Little Help needed standing up from  a chair using your arms (e.g., wheelchair or bedside chair)?: A Little Help needed to walk in hospital room?: A Lot Help needed climbing 3-5 steps with a railing? : A Lot 6 Click Score: 16    End of Session Equipment Utilized During Treatment: Gait belt Activity Tolerance: Patient limited by fatigue Patient left: in chair;with call bell/phone within reach;with chair alarm set Nurse Communication: Mobility status PT Visit Diagnosis: Difficulty in walking, not elsewhere classified (R26.2);Unsteadiness on feet (R26.81);Other  abnormalities of gait and mobility (R26.89);Muscle weakness (generalized) (M62.81);Other symptoms and signs involving the nervous system (R29.898);Pain;History of falling (Z91.81) Pain - part of body:  (Abdomen area, neck, back)     Time: 8842-8767 PT Time Calculation (min) (ACUTE ONLY): 35 min  Charges:    $Gait Training: 8-22 mins $Therapeutic Activity: 8-22 mins PT General Charges $$ ACUTE PT VISIT: 1 Visit                     Johnnie Gaynelle JACQUE Johnnie Avonell Lenig 03/11/2024, 1:40 PM

## 2024-03-11 NOTE — Plan of Care (Signed)

## 2024-03-11 NOTE — Progress Notes (Signed)
 [name]   ROUNDING   NOTE Billy [name] FMW:[id number]  DOB: 1951/01/19  DOA: 01/21/2024  PCP: Seabron Lenis, MD  03/11/2024,5:55 PM  LOS: 50 days    Code Status: Full code     from: Home   73 year old male Prolonged admission 3/10-07/20/18/2025 cardiac arrest requiring ICU management cardioverted for A-fib-found to have DVTs then had hematochezia necessitating IVC filter-recurrent GI bleed secondary to erosive gastropathy and friable duodenal mucosa and colonoscopy was deferred and then chest tube placements cardiac cath at that time showed CAD with CTO of RCA and was treated with diuretics and sent home on torsemide  Entresto  Jardiance -  Readmitted 10/5 with nausea vomiting sudden tearing sensation found to have an esophageal tear was given Kcentra   Chronology  10/7 esophageal stent placed by cardiothoracic 10/15 stent reposition 10/16 extubated 10/20 reintubated with ultrasound showing left-sided pleural effusion-left-sided chest tube placed-by lights criteria exudative effusion 10/23 echo 60-65% 10/24 barium swallow showed persistent leak 10/28 transferred out of ICU 11/3 PEG tube placed 11/19 swelling of right arm showing on ultrasound SVT cephalic vein 11/22 chest x-ray?  Pneumonia done because of back pain-follow-up CT scan shows stent in place no mediastinal fluid collection small bilateral pleural effusions with bibasilar atelectasis pneumonia not excluded-scattered clusters nodular densities predominantly right upper and right lower lobes with nodule 11 mm as previously seen?  Mets?  Infection?  Aspiration   Assessment  & Plan :    Boerhaave syndrome esophageal rupture-events as above-last esophagram 10/24 showed persistent leak IV Zosyn  continees-- Dr. Shyrl of [facility] aware re: stent removal Requires gastrostomy, continue feeds continue free water  100 every 6, IV Protonix  40 bd Do not change to bolus feeds--is having abd discomfort--needs continuous feeds/free water  Await word from [facility]  Dr. Shyrl regarding planning-possible esophagram 7 Continue IV Protonix  40 twice daily continue Maalox 30 mL every 4 as needed heart 11/22 imaging fluid versus pneumonia but is already on Zosyn  not sure if it is malignancy will ask [facility] to review Oxy IR 5 q4 moderate, IV Dilaudid  0.5 q3 prn severe pain Upper back pain Musculoskeletal in nature imaging done as above 11/22 nonspecific--the pain is improved he seems a little bit better A little bit more and that is helping the discomfort he is moving around Continue Flexeril  5 3 times daily as needed-mobilize as best able Left arm swelling Slightly improved with Lasix -continue lower dose of 20 daily Superficial venous thrombosis noted as above Only watchful waiting already on Eliquis   Hypoxic respiratory failure multifactorial with pneumonia pleural effusion CHF Continue Xopenex  inhalers Mucinex  continues on Lasix  Paroxysmal A-fib with DCCV 07/06/2023 Amiodarone  200 twice daily with metoprolol  as needed IV for heart rate above 110-de-escalate off of amiodarone  depending on next steps with surgery Converted to NSR 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 Taking Eliquis  5 twice daily  Cardiac arrest 06/2023 Uncontrolled blood pressure--continue scheduled hydralazine  10 every 8 Continue atorvastatin  80 Zetia  10 Isordil  10 twice daily-metoprolol  6.25 twice daily losartan  25 daily additionally-periodic labs Bipolar Insomnia May continue trazodone  50-aim to wean at discharge Protein energy malnutrition BMI 21 Supplements as below as above try to prevent diarrhea CKD 3B Metabolic acidosis Labs are improving-watch with addition of Lasix  Multiple bedsores and wounds anterior right and left foot and injury stage I to bottom Continue Gerhardt twice daily Lidoderm  patch for back pain Schedule Lomotil  1 tablet twice daily, can use as needed Lomotil  4 times daily in addition Diarrhea overall has improved   Data Reviewed today:  Sodium 141 potassium 4.2 BUN/creatinine 34/1.6--- bicarb is 21 Albumin  1.7 WBC 9.2 hemoglobin 10.3 platelet 208   DVT prophylaxis: DOAC  Status is: Inpatient Inpatient pending dysphagia stent removal    Dispo/Global plan: Inpatient   Time 20   Subjective:  Looks better feels better No significant new changes  Objective + exam Vitals:   03/11/24 0523 03/11/24 0836 03/11/24 1109 03/11/24 1527  BP:  (!) 159/84 (!) 153/66 (!) 145/68  Pulse: 64 65 63 63  Resp: 17 18 15 17   Temp:   97.8 F (36.6 C) 98.2 F (36.8 C)  TempSrc:   Oral Oral  SpO2: 98% 95% 95% 97%  Weight:      Height:       Filed Weights   03/09/24 0408 03/10/24 0403 03/11/24 0431  Weight: 78 kg 78.6 kg 78.6 kg     Examination:  EOMI NCAT slightly flat affect no icterus no pallor Neck soft supple Chest is clear no wheeze Abdomen is soft PEG tube in place ROM is intact Left arm is a little less swollen PICC line in right  Scheduled Meds:  amiodarone   200 mg Per Tube BID   apixaban   5 mg Per Tube BID   atorvastatin   80 mg Per Tube Daily   Chlorhexidine  Gluconate Cloth  6 each Topical Daily   diphenoxylate -atropine   1 tablet Per Tube BID   ezetimibe   10 mg Per Tube Daily   feeding supplement (PROSource TF20)  60 mL Per Tube Daily   fiber supplement (BANATROL TF)  60 mL Per Tube QID   free water   100 mL Per Tube Q6H   [name] butt cream   Topical BID   hydrALAZINE   100 mg Per Tube Q8H   insulin  aspart  0-9 Units Subcutaneous Q4H   isosorbide  dinitrate  10 mg Per Tube BID   lidocaine   1 patch Transdermal Q24H   losartan   25 mg Per Tube Daily   metoprolol  tartrate  6.25 mg Per Tube BID   pantoprazole  (PROTONIX ) IV  40 mg Intravenous Q12H   saccharomyces boulardii  250 mg Per Tube BID   sodium chloride  flush  10-40 mL Intracatheter Q12H   Continuous Infusions:  feeding supplement (KATE FARMS STANDARD ENT 1.4) 65 mL/hr at 03/11/24 1605   piperacillin -tazobactam (ZOSYN )  IV 3.375 g (03/11/24  1343)   promethazine  (PHENERGAN ) injection (IM or IVPB) 150 mL/hr at 03/11/24 9460   acetaminophen  (TYLENOL ) oral liquid 160 mg/5 mL, alum & mag hydroxide-simeth, cyclobenzaprine , diclofenac  Sodium, [COMPLETED] diphenoxylate -atropine  **FOLLOWED BY** diphenoxylate -atropine , glucagon  (human recombinant), guaiFENesin , hydrALAZINE , hydrALAZINE , HYDROmorphone  (DILAUDID ) injection, levalbuterol , lip balm, metoprolol  tartrate, mouth rinse, oxyCODONE , promethazine  (PHENERGAN ) injection (IM or IVPB), simethicone , sodium chloride , sodium chloride  flush, traZODone   Colen Grimes, MD  [facility]

## 2024-03-12 ENCOUNTER — Inpatient Hospital Stay (HOSPITAL_COMMUNITY)

## 2024-03-12 DIAGNOSIS — K223 Perforation of esophagus: Secondary | ICD-10-CM | POA: Diagnosis not present

## 2024-03-12 LAB — CBC WITH DIFFERENTIAL/PLATELET
Abs Immature Granulocytes: 0.1 K/uL — ABNORMAL HIGH (ref 0.00–0.07)
Basophils Absolute: 0.1 K/uL (ref 0.0–0.1)
Basophils Relative: 1 %
Eosinophils Absolute: 0.1 K/uL (ref 0.0–0.5)
Eosinophils Relative: 1 %
HCT: 32.9 % — ABNORMAL LOW (ref 39.0–52.0)
Hemoglobin: 10.3 g/dL — ABNORMAL LOW (ref 13.0–17.0)
Immature Granulocytes: 1 %
Lymphocytes Relative: 6 %
Lymphs Abs: 0.6 K/uL — ABNORMAL LOW (ref 0.7–4.0)
MCH: 28.1 pg (ref 26.0–34.0)
MCHC: 31.3 g/dL (ref 30.0–36.0)
MCV: 89.6 fL (ref 80.0–100.0)
Monocytes Absolute: 0.6 K/uL (ref 0.1–1.0)
Monocytes Relative: 6 %
Neutro Abs: 8.6 K/uL — ABNORMAL HIGH (ref 1.7–7.7)
Neutrophils Relative %: 85 %
Platelets: 215 K/uL (ref 150–400)
RBC: 3.67 MIL/uL — ABNORMAL LOW (ref 4.22–5.81)
RDW: 17.6 % — ABNORMAL HIGH (ref 11.5–15.5)
WBC: 10 K/uL (ref 4.0–10.5)
nRBC: 0 % (ref 0.0–0.2)

## 2024-03-12 LAB — COMPREHENSIVE METABOLIC PANEL WITH GFR
ALT: 62 U/L — ABNORMAL HIGH (ref 0–44)
AST: 39 U/L (ref 15–41)
Albumin: 1.7 g/dL — ABNORMAL LOW (ref 3.5–5.0)
Alkaline Phosphatase: 70 U/L (ref 38–126)
Anion gap: 9 (ref 5–15)
BUN: 36 mg/dL — ABNORMAL HIGH (ref 8–23)
CO2: 23 mmol/L (ref 22–32)
Calcium: 8.1 mg/dL — ABNORMAL LOW (ref 8.9–10.3)
Chloride: 108 mmol/L (ref 98–111)
Creatinine, Ser: 1.72 mg/dL — ABNORMAL HIGH (ref 0.61–1.24)
GFR, Estimated: 42 mL/min — ABNORMAL LOW (ref >60)
Glucose, Bld: 181 mg/dL — ABNORMAL HIGH (ref 70–99)
Potassium: 3.9 mmol/L (ref 3.5–5.1)
Sodium: 140 mmol/L (ref 135–145)
Total Bilirubin: 0.5 mg/dL (ref 0.0–1.2)
Total Protein: 5.2 g/dL — ABNORMAL LOW (ref 6.5–8.1)

## 2024-03-12 LAB — GLUCOSE, CAPILLARY
Glucose-Capillary: 101 mg/dL — ABNORMAL HIGH (ref 70–99)
Glucose-Capillary: 108 mg/dL — ABNORMAL HIGH (ref 70–99)
Glucose-Capillary: 129 mg/dL — ABNORMAL HIGH (ref 70–99)
Glucose-Capillary: 133 mg/dL — ABNORMAL HIGH (ref 70–99)
Glucose-Capillary: 133 mg/dL — ABNORMAL HIGH (ref 70–99)
Glucose-Capillary: 97 mg/dL (ref 70–99)

## 2024-03-12 LAB — SURGICAL PCR SCREEN
MRSA, PCR: POSITIVE — AB
Staphylococcus aureus: POSITIVE — AB

## 2024-03-12 MED ORDER — DIATRIZOATE MEGLUMINE & SODIUM 66-10 % PO SOLN
30.0000 mL | Freq: Once | ORAL | Status: AC
  Administered 2024-03-12: 30 mL
  Filled 2024-03-12: qty 30

## 2024-03-12 MED ORDER — SODIUM CHLORIDE (PF) 0.9 % IJ SOLN
INTRAMUSCULAR | Status: AC
  Administered 2024-03-12: 10 mL
  Filled 2024-03-12: qty 10

## 2024-03-12 MED ORDER — DIATRIZOATE MEGLUMINE & SODIUM 66-10 % PO SOLN
ORAL | Status: AC
  Filled 2024-03-12: qty 30

## 2024-03-12 MED ORDER — MUPIROCIN 2 % EX OINT
1.0000 | TOPICAL_OINTMENT | Freq: Two times a day (BID) | CUTANEOUS | Status: AC
  Administered 2024-03-12 – 2024-03-17 (×10): 1 via NASAL
  Filled 2024-03-12: qty 22

## 2024-03-12 NOTE — Plan of Care (Signed)
  Problem: Education: Goal: Knowledge of General Education information will improve Description: Including pain rating scale, medication(s)/side effects and non-pharmacologic comfort measures Outcome: Progressing   Problem: Health Behavior/Discharge Planning: Goal: Ability to manage health-related needs will improve Outcome: Progressing   Problem: Clinical Measurements: Goal: Will remain free from infection Outcome: Progressing Goal: Diagnostic test results will improve Outcome: Progressing Goal: Respiratory complications will improve Outcome: Progressing Goal: Cardiovascular complication will be avoided Outcome: Progressing   Problem: Activity: Goal: Risk for activity intolerance will decrease Outcome: Progressing   Problem: Nutrition: Goal: Adequate nutrition will be maintained Outcome: Progressing   Problem: Coping: Goal: Level of anxiety will decrease Outcome: Progressing   Problem: Elimination: Goal: Will not experience complications related to bowel motility Outcome: Progressing Goal: Will not experience complications related to urinary retention Outcome: Progressing   Problem: Pain Managment: Goal: General experience of comfort will improve and/or be controlled Outcome: Progressing   Problem: Safety: Goal: Ability to remain free from injury will improve Outcome: Progressing   Problem: Skin Integrity: Goal: Risk for impaired skin integrity will decrease Outcome: Progressing   Problem: Coping: Goal: Ability to adjust to condition or change in health will improve Outcome: Progressing   Problem: Fluid Volume: Goal: Ability to maintain a balanced intake and output will improve Outcome: Progressing   Problem: Health Behavior/Discharge Planning: Goal: Ability to identify and utilize available resources and services will improve Outcome: Progressing Goal: Ability to manage health-related needs will improve Outcome: Progressing   Problem: Metabolic: Goal:  Ability to maintain appropriate glucose levels will improve Outcome: Progressing   Problem: Skin Integrity: Goal: Risk for impaired skin integrity will decrease Outcome: Progressing   Problem: Tissue Perfusion: Goal: Adequacy of tissue perfusion will improve Outcome: Progressing   Problem: Education: Goal: Ability to describe self-care measures that may prevent or decrease complications (Diabetes Survival Skills Education) will improve Outcome: Progressing Goal: Individualized Educational Video(s) Outcome: Progressing   Problem: Coping: Goal: Ability to adjust to condition or change in health will improve Outcome: Progressing   Problem: Fluid Volume: Goal: Ability to maintain a balanced intake and output will improve Outcome: Progressing   Problem: Health Behavior/Discharge Planning: Goal: Ability to identify and utilize available resources and services will improve Outcome: Progressing Goal: Ability to manage health-related needs will improve Outcome: Progressing   Problem: Metabolic: Goal: Ability to maintain appropriate glucose levels will improve Outcome: Progressing

## 2024-03-12 NOTE — Progress Notes (Signed)
     [address] [place] [address]             [phone]       Discussed case with patient.   Will plan for esophageal stent removal tomorrow CT reviewed.  Nodules likely 2/2 to aspiration, and previous pleural effusion Will continue to follow  Billy [name]

## 2024-03-12 NOTE — Progress Notes (Signed)
 [name]   ROUNDING   NOTE Yacqub Baston FMW:[id number]  DOB: 1951-03-13  DOA: 01/21/2024  PCP: Seabron Lenis, MD  03/12/2024,6:17 PM  LOS: 51 days    Code Status: Full code     from: Home   73 year old male Prolonged admission 3/10-07/20/18/2025 cardiac arrest requiring ICU management cardioverted for A-fib-found to have DVTs then had hematochezia necessitating IVC filter-recurrent GI bleed secondary to erosive gastropathy and friable duodenal mucosa and colonoscopy was deferred and then chest tube placements cardiac cath at that time showed CAD with CTO of RCA and was treated with diuretics and sent home on torsemide  Entresto  Jardiance -  Readmitted 10/5 with nausea vomiting sudden tearing sensation found to have an esophageal tear was given Kcentra   Chronology  10/7 esophageal stent placed by cardiothoracic 10/15 stent reposition 10/16 extubated 10/20 reintubated with ultrasound showing left-sided pleural effusion-left-sided chest tube placed-by lights criteria exudative effusion 10/23 echo 60-65% 10/24 barium swallow showed persistent leak 10/28 transferred out of ICU 11/3 PEG tube placed 11/19 swelling of right arm showing on ultrasound SVT cephalic vein 11/22 chest x-ray?  Pneumonia done because of back pain-follow-up CT scan shows stent in place no mediastinal fluid collection small bilateral pleural effusions with bibasilar atelectasis pneumonia not excluded-scattered clusters nodular densities predominantly right upper and right lower lobes with nodule 11 mm as previously seen?  Mets?  Infection?  Aspiration 11/25 PEG tube placement x-ray shows no extravasation   Assessment  & Plan :    Boerhaave syndrome esophageal rupture-events as above-last esophagram 10/24 showed persistent leak IV Zosyn  continees-- Dr. Shyrl of CVTS aware re: stent removal Requires gastrostomy, continue feeds continue free water  100 every 6, IV Protonix  40 bd Going for esophageal stent removal likely 11/26 Continue  IV Protonix  40 twice daily continue Maalox 30 mL every 4 as needed heart Nodules seen on recent chest CT are likely secondary to aspiration /effusion per CVTS Oxy IR 5 q4 moderate, IV Dilaudid  0.5 q3 prn severe pain Upper back pain Musculoskeletal in nature imaging done as above 11/22 nonspecific-overall improved Continue Flexeril  5 3 times daily as needed-mobilize as best able Left arm swelling Slightly improved with Lasix -continue lower dose of 20 daily Superficial venous thrombosis noted as above Only watchful waiting already on Eliquis   Hypoxic respiratory failure multifactorial with pneumonia pleural effusion CHF Continue Xopenex  inhalers Mucinex  continues on Lasix  Paroxysmal A-fib with DCCV 07/06/2023 Amiodarone  200 twice daily with metoprolol  as needed IV for heart rate above 110-de-escalate off of amiodarone  depending on next steps with surgery? 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 in the next several days depending on trends Bipolar Insomnia May continue trazodone  50-aim to wean at discharge Protein energy malnutrition BMI 21 Supplements as below as above try to prevent diarrhea CKD 3B Metabolic acidosis Labs are improving-watch with addition of Lasix  Multiple bedsores and wounds anterior right and left foot and injury stage I to bottom Continue Gerhardt twice daily Lidoderm  patch for back pain Schedule Lomotil  1 tablet twice daily has been ordered-he also has an order for as needed Lomotil  4 times daily in addition Diarrhea overall has improved   Data Reviewed today:   Sodium 140 potassium 3.9 bicarb 23 BUN/creatinine  36/1.7 LFTs  normal WBC 10.0 hemoglobin 10.3 platelet 215  DVT prophylaxis: DOAC  Status is: Inpatient Inpatient pending dysphagia stent removal    Dispo/Global plan: Inpatient   Time 20   Subjective:  Overall much improved back pain is better no other fever chills other issues Happy he is going for stent removal tomorrow  Objective + exam Vitals:   03/12/24 0907 03/12/24 1114 03/12/24 1400 03/12/24 1518  BP: (!) 147/57 133/64 134/67 (!) 126/59  Pulse: 71 65  64  Resp:  16  15  Temp:  98 F (36.7 C)  98.2 F (36.8 C)  TempSrc:  Oral  Oral  SpO2:  96%  97%  Weight:      Height:       Filed Weights   03/10/24 0403 03/11/24 0431 03/12/24 0312  Weight: 78.6 kg 78.6 kg 77.5 kg     Examination:  EOMI NCAT slightly flat affect no icterus no pallor Neck soft supple Chest is clear no wheeze Abdomen is soft PEG tube in place Left upper extremity slightly swollen but decreased right upper extremity remains quite swollen  Scheduled Meds:  amiodarone   200 mg Per Tube BID   apixaban   5 mg Per Tube BID   atorvastatin   80 mg Per Tube Daily   Chlorhexidine  Gluconate Cloth  6 each Topical Daily   diphenoxylate -atropine   1 tablet Per Tube BID   ezetimibe   10 mg Per Tube Daily   feeding supplement (PROSource TF20)  60 mL Per Tube Daily   fiber supplement (BANATROL TF)  60 mL Per Tube QID   free water   100 mL Per Tube Q6H   furosemide   20 mg Per Tube Daily   [name] butt cream   Topical BID   hydrALAZINE   100 mg Per Tube Q8H   insulin  aspart  0-9 Units Subcutaneous Q4H   isosorbide  dinitrate  10 mg Per Tube BID   lidocaine   1 patch Transdermal Q24H   losartan   25 mg Per Tube Daily   metoprolol  tartrate  6.25 mg Per Tube BID   pantoprazole  (PROTONIX ) IV  40 mg Intravenous Q12H   saccharomyces boulardii  250 mg Per Tube BID   sodium chloride  flush  10-40 mL Intracatheter Q12H   Continuous Infusions:  feeding supplement ([place] STANDARD ENT 1.4) 1,000 mL (03/12/24  1400)   piperacillin -tazobactam (ZOSYN )  IV 3.375 g (03/12/24 1400)   promethazine  (PHENERGAN ) injection (IM or IVPB) 150 mL/hr at 03/12/24 0344   acetaminophen  (TYLENOL ) oral liquid 160 mg/5 mL, alum & mag hydroxide-simeth, cyclobenzaprine , diclofenac  Sodium, [COMPLETED] diphenoxylate -atropine  **FOLLOWED BY** diphenoxylate -atropine , glucagon  (human recombinant), guaiFENesin , hydrALAZINE , hydrALAZINE , HYDROmorphone  (DILAUDID ) injection, levalbuterol , lip balm, metoprolol  tartrate, mouth rinse, oxyCODONE , promethazine  (PHENERGAN ) injection (IM or IVPB), simethicone , sodium chloride , sodium chloride  flush, traZODone   Colen Grimes, MD  [facility]

## 2024-03-12 NOTE — TOC Progression Note (Signed)
 Transition of Care [facility]) - Progression Note    Patient Details  Name: Billy [name] MRN: [id number] Date of Birth: 1950/10/13  [facility]) CM/SW Contact  Lauraine FORBES Saa, LCSWA Phone Number: 03/12/2024, 12:53 PM  Clinical Narrative:     12:54 PM Per chart review, patient's stent removal has been scheduled for tomorrow. CSW to submit SNF insurance authorization for [place] upon procedure. CSW will continue to follow.  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, SNF Pending bed offer               Expected Discharge Plan and Services In-house Referral: Clinical Social Work Discharge Planning Services: [employer] Consult Post Acute Care Choice: Skilled Nursing Facility Living arrangements for the past 2 months: Single Family Home                                       Social Drivers of Health (SDOH) Interventions SDOH Screenings   Food Insecurity: No Food Insecurity (01/22/2024)  Housing: High Risk (01/22/2024)  Transportation Needs: No Transportation Needs (01/22/2024)  Utilities: Not At Risk (01/22/2024)  Depression (PHQ2-9): Low Risk  (12/01/2020)  Social Connections: Socially Isolated (01/22/2024)  Tobacco Use: Low Risk  (02/19/2024)    Readmission Risk Interventions    06/27/2023    1:52 PM 10/14/2022   11:29 AM  Readmission Risk Prevention Plan  Post Dischage Appt  Complete  Medication Screening  Complete  Transportation Screening Complete Complete  HRI or Home Care Consult Complete   Social Work Consult for Recovery Care Planning/Counseling Complete   Palliative Care Screening Not Applicable   Medication Review Oceanographer) Referral to Pharmacy

## 2024-03-13 ENCOUNTER — Inpatient Hospital Stay (HOSPITAL_COMMUNITY)

## 2024-03-13 ENCOUNTER — Inpatient Hospital Stay (HOSPITAL_COMMUNITY): Payer: Self-pay | Admitting: Anesthesiology

## 2024-03-13 ENCOUNTER — Other Ambulatory Visit: Payer: Self-pay

## 2024-03-13 ENCOUNTER — Encounter (HOSPITAL_COMMUNITY)
Admission: EM | Disposition: A | Discharge status: Skilled Nursing Facility | Payer: Self-pay | Source: Home / Self Care | Attending: Family Medicine

## 2024-03-13 ENCOUNTER — Encounter (HOSPITAL_COMMUNITY): Payer: Self-pay | Admitting: Internal Medicine

## 2024-03-13 DIAGNOSIS — Z4589 Encounter for adjustment and management of other implanted devices: Secondary | ICD-10-CM | POA: Diagnosis not present

## 2024-03-13 DIAGNOSIS — N1832 Chronic kidney disease, stage 3b: Secondary | ICD-10-CM

## 2024-03-13 DIAGNOSIS — K223 Perforation of esophagus: Secondary | ICD-10-CM | POA: Diagnosis not present

## 2024-03-13 DIAGNOSIS — I129 Hypertensive chronic kidney disease with stage 1 through stage 4 chronic kidney disease, or unspecified chronic kidney disease: Secondary | ICD-10-CM

## 2024-03-13 DIAGNOSIS — I5023 Acute on chronic systolic (congestive) heart failure: Secondary | ICD-10-CM

## 2024-03-13 DIAGNOSIS — I480 Paroxysmal atrial fibrillation: Secondary | ICD-10-CM

## 2024-03-13 DIAGNOSIS — Z4659 Encounter for fitting and adjustment of other gastrointestinal appliance and device: Secondary | ICD-10-CM

## 2024-03-13 DIAGNOSIS — D631 Anemia in chronic kidney disease: Secondary | ICD-10-CM | POA: Diagnosis not present

## 2024-03-13 DIAGNOSIS — I130 Hypertensive heart and chronic kidney disease with heart failure and stage 1 through stage 4 chronic kidney disease, or unspecified chronic kidney disease: Secondary | ICD-10-CM | POA: Diagnosis not present

## 2024-03-13 HISTORY — PX: ESOPHAGOGASTRODUODENOSCOPY: SHX5428

## 2024-03-13 LAB — CBC WITH DIFFERENTIAL/PLATELET
Abs Immature Granulocytes: 0.08 K/uL — ABNORMAL HIGH (ref 0.00–0.07)
Basophils Absolute: 0.1 K/uL (ref 0.0–0.1)
Basophils Relative: 0 %
Eosinophils Absolute: 0.1 K/uL (ref 0.0–0.5)
Eosinophils Relative: 1 %
HCT: 33 % — ABNORMAL LOW (ref 39.0–52.0)
Hemoglobin: 10.3 g/dL — ABNORMAL LOW (ref 13.0–17.0)
Immature Granulocytes: 1 %
Lymphocytes Relative: 5 %
Lymphs Abs: 0.5 K/uL — ABNORMAL LOW (ref 0.7–4.0)
MCH: 28.1 pg (ref 26.0–34.0)
MCHC: 31.2 g/dL (ref 30.0–36.0)
MCV: 89.9 fL (ref 80.0–100.0)
Monocytes Absolute: 0.7 K/uL (ref 0.1–1.0)
Monocytes Relative: 6 %
Neutro Abs: 9.8 K/uL — ABNORMAL HIGH (ref 1.7–7.7)
Neutrophils Relative %: 87 %
Platelets: 206 K/uL (ref 150–400)
RBC: 3.67 MIL/uL — ABNORMAL LOW (ref 4.22–5.81)
RDW: 17.2 % — ABNORMAL HIGH (ref 11.5–15.5)
WBC: 11.2 K/uL — ABNORMAL HIGH (ref 4.0–10.5)
nRBC: 0 % (ref 0.0–0.2)

## 2024-03-13 LAB — GLUCOSE, CAPILLARY
Glucose-Capillary: 104 mg/dL — ABNORMAL HIGH (ref 70–99)
Glucose-Capillary: 181 mg/dL — ABNORMAL HIGH (ref 70–99)
Glucose-Capillary: 203 mg/dL — ABNORMAL HIGH (ref 70–99)
Glucose-Capillary: 96 mg/dL (ref 70–99)
Glucose-Capillary: 96 mg/dL (ref 70–99)
Glucose-Capillary: 99 mg/dL (ref 70–99)

## 2024-03-13 LAB — COMPREHENSIVE METABOLIC PANEL WITH GFR
ALT: 66 U/L — ABNORMAL HIGH (ref 0–44)
AST: 41 U/L (ref 15–41)
Albumin: 1.8 g/dL — ABNORMAL LOW (ref 3.5–5.0)
Alkaline Phosphatase: 65 U/L (ref 38–126)
Anion gap: 8 (ref 5–15)
BUN: 36 mg/dL — ABNORMAL HIGH (ref 8–23)
CO2: 23 mmol/L (ref 22–32)
Calcium: 8.1 mg/dL — ABNORMAL LOW (ref 8.9–10.3)
Chloride: 107 mmol/L (ref 98–111)
Creatinine, Ser: 1.79 mg/dL — ABNORMAL HIGH (ref 0.61–1.24)
GFR, Estimated: 40 mL/min — ABNORMAL LOW (ref >60)
Glucose, Bld: 101 mg/dL — ABNORMAL HIGH (ref 70–99)
Potassium: 4 mmol/L (ref 3.5–5.1)
Sodium: 138 mmol/L (ref 135–145)
Total Bilirubin: 0.8 mg/dL (ref 0.0–1.2)
Total Protein: 5.4 g/dL — ABNORMAL LOW (ref 6.5–8.1)

## 2024-03-13 MED ORDER — IOHEXOL 300 MG/ML  SOLN
INTRAMUSCULAR | Status: DC | PRN
  Administered 2024-03-13: 45 mL

## 2024-03-13 MED ORDER — 0.9 % SODIUM CHLORIDE (POUR BTL) OPTIME
TOPICAL | Status: DC | PRN
  Administered 2024-03-13: 1000 mL

## 2024-03-13 MED ORDER — LIDOCAINE 2% (20 MG/ML) 5 ML SYRINGE
INTRAMUSCULAR | Status: DC | PRN
Start: 2024-03-13 — End: 2024-03-13
  Administered 2024-03-13: 60 mg via INTRAVENOUS

## 2024-03-13 MED ORDER — PROPOFOL 10 MG/ML IV BOLUS
INTRAVENOUS | Status: DC | PRN
  Administered 2024-03-13: 60 mg via INTRAVENOUS

## 2024-03-13 MED ORDER — CHLORHEXIDINE GLUCONATE 0.12 % MT SOLN
OROMUCOSAL | Status: AC
  Administered 2024-03-13: 15 mL via OROMUCOSAL
  Filled 2024-03-13: qty 15

## 2024-03-13 MED ORDER — CHLORHEXIDINE GLUCONATE 0.12 % MT SOLN: 15.0000 mL | Freq: Once | OROMUCOSAL | Status: AC

## 2024-03-13 MED ORDER — FENTANYL CITRATE (PF) 100 MCG/2ML IJ SOLN
INTRAMUSCULAR | Status: AC
  Filled 2024-03-13: qty 2

## 2024-03-13 MED ORDER — ORAL CARE MOUTH RINSE: 15.0000 mL | Freq: Once | OROMUCOSAL | Status: AC

## 2024-03-13 MED ORDER — DEXAMETHASONE SOD PHOSPHATE PF 10 MG/ML IJ SOLN
INTRAMUSCULAR | Status: DC | PRN
  Administered 2024-03-13: 5 mg via INTRAVENOUS

## 2024-03-13 MED ORDER — ONDANSETRON HCL 4 MG/2ML IJ SOLN
INTRAMUSCULAR | Status: DC | PRN
  Administered 2024-03-13: 4 mg via INTRAVENOUS

## 2024-03-13 MED ORDER — PHENYLEPHRINE 80 MCG/ML (10ML) SYRINGE FOR IV PUSH (FOR BLOOD PRESSURE SUPPORT)
PREFILLED_SYRINGE | INTRAVENOUS | Status: DC | PRN
  Administered 2024-03-13 (×2): 160 ug via INTRAVENOUS

## 2024-03-13 MED ORDER — LACTATED RINGERS IV SOLN: INTRAVENOUS | Status: DC

## 2024-03-13 MED ORDER — SUCCINYLCHOLINE CHLORIDE 200 MG/10ML IV SOSY
PREFILLED_SYRINGE | INTRAVENOUS | Status: DC | PRN
  Administered 2024-03-13: 120 mg via INTRAVENOUS

## 2024-03-13 NOTE — Progress Notes (Addendum)
 Progress Note   Patient: Billy [name] FMW:[id number] DOB: 13-Aug-1950 DOA: 01/21/2024     52 DOS: the patient was seen and examined on 03/13/2024   Brief hospital course: 73 year old male Prolonged admission 3/10-07/20/18/2025 cardiac arrest requiring ICU management cardioverted for A-fib,-found to have DVTs, developed hematochezia on anticoagulation necessitating IVC filter, with recurrent GI bleed secondary to erosive gastropathy and friable duodenal mucosa and colonoscopy was deferred and then chest tube placements cardiac cath at that time showed CAD with CTO of RCA and was treated with diuretics and sent home on torsemide  Entresto  Jardiance .   Readmitted 10/5 with nausea vomiting sudden tearing sensation found to have an esophageal tear was given Kcentra    Significant Events  10/7 esophageal stent placed by cardiothoracic 10/15 stent reposition 10/16 extubated 10/20 reintubated with ultrasound showing left-sided pleural effusion-left-sided chest tube placed-by lights criteria exudative effusion 10/23 echo 60-65% 10/24 barium swallow showed persistent leak 10/28 transferred out of ICU 11/3 PEG tube placed 11/19 swelling of right arm showing on ultrasound SVT cephalic vein 11/22 chest x-ray?  Pneumonia done because of back pain-follow-up CT scan shows stent in place no mediastinal fluid collection small bilateral pleural effusions with bibasilar atelectasis pneumonia not excluded-scattered clusters nodular densities predominantly right upper and right lower lobes with nodule 11 mm as previously seen?  Mets?  Infection?  Aspiration 11/25 PEG tube placement x-ray shows no extravasation  Assessment and Plan:  Boerhaave syndrome esophageal rupture-events as above-last esophagram 10/24 showed persistent leak --Continue IV Zosyn   -- Dr. Shyrl of CVTS following Going for esophageal stent removal TODAY --Requires gastrostomy --Continue feeds  --Continue free water  100 every 6 --Continue IV  Protonix  40 twice daily continue Maalox 30 mL every 4 as needed heart --Nodules seen on recent chest CT are likely secondary to aspiration /effusion per CVTS --Oxy IR 5 q4 moderate, IV Dilaudid  0.5 q3 prn severe pain --Diet advancement post stent removal per CTS  Upper back pain Musculoskeletal in nature imaging done as above 11/22 nonspecific-overall improved --Continue Flexeril  5 3 times daily as needed --Mobilize   Left arm swelling --Slightly improved with Lasix -continue lower dose of 20 daily  Superficial venous thrombosis noted as above --Monitor --Aready on Eliquis    Hypoxic respiratory failure multifactorial with pneumonia pleural effusion CHF --Continue Xopenex  inhalers Mucinex  continues on Lasix   Paroxysmal A-fib with DCCV 07/06/2023 -- Amiodarone  200 twice daily  --metoprolol  as needed IV for heart rate above 110 --de-escalate off of amiodarone  depending on next steps with surgery? 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  Insomnia --Continue trazodone    Protein energy malnutrition BMI 21 --Supplements and vitamins --Dietitian following  CKD stage 3B -- stable  --Monitor  Metabolic acidosis - resolved --Monitor labs  Multiple bedsores and wounds anterior right and left foot and injury stage I to bottom --Continue Gerhardt twice daily  --Lidoderm  patch for back pain --Schedule Lomotil  1 tablet BID and QID PRN --Diarrhea overall has improved  History of Cardiac arrest 06/2023      Subjective: Pt seen this morning at bedside prior to going to OR for stent removal.  He  reports some back pain, medication was  given recently and is starting to let up some.  He reports occasional abdominal pain but no other acute complaints.  Physical Exam: Vitals:   03/13/24 1415 03/13/24 1430 03/13/24 1432 03/13/24 1443  BP: (!) 167/69 (!) 172/64 (!) 167/66 (!) 167/66  Pulse: 69 68 68 69  Resp: 17 19 17 16   Temp:   98 F (36.7 C) 97.8 F (36.6 C)  TempSrc:    Oral  SpO2: 93% 93% 94% 95%  Weight:      Height:        General exam: awake, alert, no acute distress HEENT: moist mucus membranes, hearing grossly normal  Respiratory system: CTAB, no wheezes, rales or rhonchi, normal respiratory effort. Cardiovascular system: normal S1/S2,  no pedal edema.   Gastrointestinal system: soft, nondistended G-tube present Central nervous system: A&O x3. no gross focal neurologic deficits, normal speech Extremities: moves all, no edema, normal tone Skin: dry, intact, normal temperature Psychiatry: normal mood, congruent affect, judgement and insight appear normal   Data Reviewed:  Notable labs: Glucose 101 BUN 36 Creatinine 1.79 stable Calcium  8.1 Albumin  1.8 ALT 66 Total protein 5.4  WBC 11.2 Hemoglobin 10.3 stable  Family Communication: None present.  Patient updated in detail  Disposition: Status is: Inpatient Remains inpatient appropriate because: Ongoing evaluation and procedures.  Discharge will be pending clearance by cardiothoracic surgery.   Planned Discharge Destination: Skilled nursing facility    Time spent: 45 minutes  Author: Burnard DELENA Cunning, DO 03/13/2024 3:18 PM  For on call review [email or web address].

## 2024-03-13 NOTE — Op Note (Signed)
      [address] Zone [place] [address]             [phone]      03/13/2024  Patient:  Billy [name] Pre-Op Dx: hx of esophageal perforation   Post-op Dx:  same Procedure: - Esophagogastroscopy - Removal of esophageal stent - esophagram with Omnipaque    Surgeon and Role:      * Colter Magowan, Linnie KIDD, MD - Primary  Anesthesia  general EBL:  0ml Blood Administration: none Specimen:  none   Counts: correct   Indications: 73yo male with hx of esophageal perforation.  He comes in today for esophageal stent removal Findings: No evidence of leak on the esophagram  Operative Technique: After the risks, benefits and alternatives were thoroughly discussed, the patient was brought to the operative theatre.  Anesthesia was induced. The patient was prepped and draped in normal sterile fashion.  An appropriate surgical pause was performed, and pre-operative antibiotics were dosed accordingly.  The gastroscope was advanced through the oropharynx into the cervical esophagus under direct visualization.  The scope was passed into the stomach.  The scope was then pulled back, and the esophageal mucosa was visualized. The esophageal stent was removed.  Contrast was injected through the scope.  No evidence of extravasation on esophagram.    The patient tolerated the procedure without any immediate complications, and was transferred to the PACU in stable condition.  Billy [name]

## 2024-03-13 NOTE — Progress Notes (Signed)
     [address] [place] [address]             [phone]       No events Vitals:   03/13/24 1108 03/13/24 1124  BP: (!) 145/76 (!) 186/73  Pulse: 72 67  Resp: 19 18  Temp: 98 F (36.7 C) 98.3 F (36.8 C)  SpO2: 96% 96%   Alert NAD EWOB  OR for EGD and stent removal  Shaterria Sager O Qais Jowers

## 2024-03-13 NOTE — Progress Notes (Signed)
 PT Cancellation Note  Patient Details Name: Billy [name] MRN: [id number] DOB: Sep 20, 1950   Cancelled Treatment:    Reason Eval/Treat Not Completed: (P) Patient at procedure or test/unavailable. (Stent removal) Continue with PT POC as schedule permits.    Billy [name] 03/13/2024, 11:24 AM

## 2024-03-13 NOTE — Progress Notes (Addendum)
 Nutrition Follow-up  DOCUMENTATION CODES:   Severe malnutrition in context of acute illness/injury  INTERVENTION:  Continue TF via PEG: [place] Standard 1.4 to 65 ml/h (1560 ml per day) -Prosource TF20 60ml daily - FWF: 100 mL q6h (400 mL/day) -Provides 2264 kcal, 117 gm protein, 1508 ml free water  daily    Contnue Banatrol QID -provides 45kcal, 5g soluble fiber per serving Continue Florastor BID for re-establishing gut microbiome    NUTRITION DIAGNOSIS:   Severe Malnutrition related to acute illness (may have chronic component due to time frame of reported wt loss) as evidenced by severe fat depletion, moderate muscle depletion. - remains applicable  GOAL:  Patient will meet greater than or equal to 90% of their needs - meeting via TF; current NPO for surgery  MONITOR:  Diet advancement, Labs, Weight trends, I & O's, Skin (TPN)  REASON FOR ASSESSMENT:   Consult Enteral/tube feeding initiation and management  ASSESSMENT:   73 yo male admitted post several days of N/V followed by sudden onset of severe epigastric pain radiating to chest and back. Pt found to have small esophageal perforation above GE junction. PMH includes CAD, chronic HFpEF, HTN, HLD, DVT, CVA, PAF, CKD 3b.  10/05 Admitted 10/06 Esophagogram/barium study: distal esophageal perforation, TPN started 10/07 S/p Esophageal stent, TPN at goal rate of 80 ml/hr 10/08  CLD 10/10 Diet advanced to Dysphagia 3, Calorie Count 10/13 No meal tickets for calorie count, pt reports eating bites only 10/15 Esophageal stent migration with leak on esophagram with return to OR for stent repositioning, likely aspiration event requiring intubation 10/16 Extubated, Chest tube placed 10/17 Limited echo with EF 50-55%, RV mildly reduced 10/20 Intubated early AM, L pleural effusion, pigtail chest tube inserted 10/23 - extubated; limited echo: 60-65% 10/24 - esophagram: persistent leak 10/28 - transfer out of ICU 11/03 - PEG  tube placement  11/04 - TF started 11/5 - TF at 20 ml/hr 11/6 - TPN decreased to meet 50% of pt's needs, TF at 50 ml/hr,  11/7 - TF at 60 ml/hr, multiple loose BM, decrease rate of tube feeds to 45 ml/hr, continue TPN to meet 50% of needs 11/10 - increase TF to 81ml/hr; TPN meet 50% of needs 11/11 - increase TF to goal rate (modified to Vital 1.5 as Mallie Pinion out of stock); d/c TPN tomorrow 11/12 - modify to Vital 1.5 @60ml /hr (goal rate) 11/15 - increased stool output; modified back to KF1.4 @65ml /hr (goal rate) 11/20 - modified to boluses 11/21 - developed chest pain in the evening; modified back to continuous 11/23 - CT chest: small b/l pleural effusions w/ bibasilar atelectasis, PNA not excluded, scattered clusters of nodular density predominantly in upper and right lower lobes 11/25 - Xray abd: PEG tube in stomach w/ no contrast extravasation noted 11/26 - esophageal stent removal  He is scheduled for esophageal stent removal today. Remains on continuous tube feedings. Chest pain, which appeared to be musculoskeletal in nature has improved. Bowels stabilized with two documented yesterday. Lomotil  remains scheduled and ordered PRN.    Admit Weight: 72.8 kg Current Weight: 78.8 kg Lowest Weight: 66.8 kg on 11/06   Some generalized, non-pitting edema noted, however not significant. Weight trending upward.   Corrected calcium  within desirable range now. BUN/Crt stable. Lasix  restarted 11/24. Has not required any recent supplementation to correct electrolyte derangements. Did require a dose of antiemetic this morning. Averaging 2-3 doses per day in recent days.   Drains/Lines: R brachial: PICC, triple lumen LUQ: PEG (24Fr)  placed 11/03   Labs from 11/10 reviewd: Sodium 138 (wdl) Potassium 4.0 (wdl) BUN 36 Creatinine 1.79  Corr Ca 9.9 (wdl) WBC 11.2(H) CBGs 101-181 x24 hours A1c 6.0 (01/2024)   Meds: Lomitil BID Furosemide  SS Novolog  Pantoprazole  Florastor IV ABX   IV  Phenergan  x1  NUTRITION - FOCUSED PHYSICAL EXAM: Will repeat NFPE upon next assessment at bedside  Diet Order:   Diet Order             Diet NPO time specified  Diet effective now            EDUCATION NEEDS:  Education needs have been addressed  Skin:  Skin Assessment: Skin Integrity Issues: Skin Integrity Issues:: Stage I Stage I: buttocks, bilateral feet  Last BM:  11/25 - type 7 x2  Height:  Ht Readings from Last 1 Encounters:  02/07/24 6' 1 (1.854 m)   Weight:  Wt Readings from Last 1 Encounters:  03/13/24 78.8 kg   Ideal Body Weight:  83.6 kg  BMI:  Body mass index is 22.92 kg/m.  Estimated Nutritional Needs:   Kcal:  2300-2500 kcals  Protein:  110-130 g  Fluid:  2L  Billy Deaner MS, RD, LDN Registered Dietitian Clinical Nutrition RD Inpatient Contact Info in Amion

## 2024-03-13 NOTE — Anesthesia Postprocedure Evaluation (Signed)
 Anesthesia Post Note  Patient: Billy [name]  Procedure(s) Performed: ESOPHAGOGASTRODUODENOSCOPY WITH STENT REMOVAL (Esophagus)     Patient location during evaluation: PACU Anesthesia Type: General Level of consciousness: awake and alert Pain management: pain level controlled Vital Signs Assessment: post-procedure vital signs reviewed and stable Respiratory status: spontaneous breathing, nonlabored ventilation and respiratory function stable Cardiovascular status: stable and blood pressure returned to baseline Anesthetic complications: no   No notable events documented.  Last Vitals:  Vitals:   03/13/24 1432 03/13/24 1443  BP: (!) 167/66 (!) 167/66  Pulse: 68 69  Resp: 17 16  Temp: 36.7 C 36.6 C  SpO2: 94% 95%                    Debby FORBES Like

## 2024-03-13 NOTE — TOC Progression Note (Signed)
 Transition of Care [facility]) - Progression Note    Patient Details  Name: Billy [name] MRN: [id number] Date of Birth: May 24, 1950  Transition [facility]) CM/SW Contact  Niquan Charnley LITTIE Mo[place], [place] Phone Number: 03/13/2024, 2:46 PM  Clinical Narrative:    CSW initiated insurance auth for [place] and provided HTA with covering social workers phone number for 11/27.    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, SNF Pending bed offer               Expected Discharge Plan and Services In-house Referral: Clinical Social Work Discharge Planning Services: [employer] Consult Post Acute Care Choice: Skilled Nursing Facility Living arrangements for the past 2 months: Single Family Home                                       Social Drivers of Health (SDOH) Interventions SDOH Screenings   Food Insecurity: No Food Insecurity (01/22/2024)  Housing: High Risk (01/22/2024)  Transportation Needs: No Transportation Needs (01/22/2024)  Utilities: Not At Risk (01/22/2024)  Depression (PHQ2-9): Low Risk  (12/01/2020)  Social Connections: Socially Isolated (01/22/2024)  Tobacco Use: Low Risk  (03/13/2024)    Readmission Risk Interventions    06/27/2023    1:52 PM 10/14/2022   11:29 AM  Readmission Risk Prevention Plan  Post Dischage Appt  Complete  Medication Screening  Complete  Transportation Screening Complete Complete  HRI or Home Care Consult Complete   Social Work Consult for Recovery Care Planning/Counseling Complete   Palliative Care Screening Not Applicable   Medication Review Oceanographer) Referral to Pharmacy

## 2024-03-13 NOTE — Transfer of Care (Signed)
 Immediate Anesthesia Transfer of Care Note  Patient: Billy [name]  Procedure(s) Performed: ESOPHAGOGASTRODUODENOSCOPY WITH STENT REMOVAL (Esophagus)  Patient Location: PACU  Anesthesia Type:General  Level of Consciousness: awake, alert , and oriented  Airway & Oxygen  Therapy: Patient Spontanous Breathing  Post-op Assessment: Report given to RN and Post -op Vital signs reviewed and stable  Post vital signs: Reviewed and stable  Last Vitals:  Vitals Value Taken Time  BP 142/47 03/13/24 14:02  Temp 37.2 C 03/13/24 14:02  Pulse 66 03/13/24 14:05  Resp 16 03/13/24 14:05  SpO2 95 % 03/13/24 14:05  Vitals shown include unfiled device data.  Last Pain:  Vitals:   03/13/24 1150  TempSrc:   PainSc: 4       Patients Stated Pain Goal: 0 (03/13/24 0336)  Complications: No notable events documented.

## 2024-03-13 NOTE — Anesthesia Preprocedure Evaluation (Addendum)
 Anesthesia Evaluation  Patient identified by MRN, date of birth, ID band Patient awake    Reviewed: Allergy & Precautions, NPO status , Patient's Chart, lab work & pertinent test results  History of Anesthesia Complications (+) PONV and history of anesthetic complications  Airway Mallampati: III  TM Distance: >3 FB Neck ROM: Limited    Dental  (+) Dental Advisory Given, Teeth Intact   Pulmonary neg pulmonary ROS   Pulmonary exam normal        Cardiovascular hypertension, Pt. on medications + Past MI and + DVT  Normal cardiovascular exam+ dysrhythmias Atrial Fibrillation    Cardiac arrest 06/2023  '25 TTE - EF 60 to 65%. There is mild concentric left ventricular hypertrophy. Trivial mitral valve regurgitation.     Neuro/Psych TIA Neuromuscular disease  negative psych ROS   GI/Hepatic Neg liver ROS,,, Esophageal perforation    Endo/Other  negative endocrine ROS    Renal/GU CRFRenal disease     Musculoskeletal negative musculoskeletal ROS (+)    Abdominal   Peds  Hematology  (+) Blood dyscrasia, anemia  On eliquis     Anesthesia Other Findings   Reproductive/Obstetrics                              Anesthesia Physical Anesthesia Plan  ASA: 4  Anesthesia Plan: General   Post-op Pain Management: Minimal or no pain anticipated   Induction: Intravenous  PONV Risk Score and Plan: 3 and Treatment may vary due to age or medical condition, Ondansetron  and Dexamethasone   Airway Management Planned: Oral ETT  Additional Equipment: None  Intra-op Plan:   Post-operative Plan: Extubation in OR  Informed Consent: I have reviewed the patients History and Physical, chart, labs and discussed the procedure including the risks, benefits and alternatives for the proposed anesthesia with the patient or authorized representative who has indicated his/her understanding and acceptance.      Dental advisory given  Plan Discussed with: CRNA and Anesthesiologist  Anesthesia Plan Comments: (Have glidescope available)         Anesthesia Quick Evaluation

## 2024-03-13 NOTE — Progress Notes (Signed)
     [address] [place] [address]             [phone]       Esophagram clear Ok for liquids, and advance as tolerated  Mohmmad Saleeby O Zehava Turski

## 2024-03-13 NOTE — Anesthesia Procedure Notes (Signed)
 Procedure Name: Intubation Date/Time: 03/13/2024 1:28 PM  Performed by: Delores Dus, CRNAPre-anesthesia Checklist: Patient identified, Emergency Drugs available, Suction available and Patient being monitored Patient Re-evaluated:Patient Re-evaluated prior to induction Oxygen  Delivery Method: Circle system utilized Preoxygenation: Pre-oxygenation with 100% oxygen  Induction Type: IV induction Ventilation: Mask ventilation without difficulty Laryngoscope Size: Miller and 2 Grade View: Grade I Tube type: Oral Tube size: 7.0 mm Number of attempts: 1 Airway Equipment and Method: Stylet and Oral airway Placement Confirmation: ETT inserted through vocal cords under direct vision, positive ETCO2 and breath sounds checked- equal and bilateral Secured at: 22 cm Tube secured with: Tape Dental Injury: Teeth and Oropharynx as per pre-operative assessment

## 2024-03-13 NOTE — Progress Notes (Signed)
 Nurse asked for attending physician if ok to restart tube feedings. As discussed with Dr. Shyrl, ok to resume tube feedings and start clear liquids.

## 2024-03-14 DIAGNOSIS — K223 Perforation of esophagus: Secondary | ICD-10-CM | POA: Diagnosis not present

## 2024-03-14 LAB — CBC WITH DIFFERENTIAL/PLATELET
Abs Immature Granulocytes: 0.07 K/uL (ref 0.00–0.07)
Basophils Absolute: 0 K/uL (ref 0.0–0.1)
Basophils Relative: 0 %
Eosinophils Absolute: 0 K/uL (ref 0.0–0.5)
Eosinophils Relative: 0 %
HCT: 31.7 % — ABNORMAL LOW (ref 39.0–52.0)
Hemoglobin: 9.8 g/dL — ABNORMAL LOW (ref 13.0–17.0)
Immature Granulocytes: 1 %
Lymphocytes Relative: 5 %
Lymphs Abs: 0.5 K/uL — ABNORMAL LOW (ref 0.7–4.0)
MCH: 27.8 pg (ref 26.0–34.0)
MCHC: 30.9 g/dL (ref 30.0–36.0)
MCV: 90.1 fL (ref 80.0–100.0)
Monocytes Absolute: 0.6 K/uL (ref 0.1–1.0)
Monocytes Relative: 6 %
Neutro Abs: 9.3 K/uL — ABNORMAL HIGH (ref 1.7–7.7)
Neutrophils Relative %: 88 %
Platelets: 220 K/uL (ref 150–400)
RBC: 3.52 MIL/uL — ABNORMAL LOW (ref 4.22–5.81)
RDW: 17.2 % — ABNORMAL HIGH (ref 11.5–15.5)
WBC: 10.4 K/uL (ref 4.0–10.5)
nRBC: 0 % (ref 0.0–0.2)

## 2024-03-14 LAB — COMPREHENSIVE METABOLIC PANEL WITH GFR
ALT: 75 U/L — ABNORMAL HIGH (ref 0–44)
AST: 38 U/L (ref 15–41)
Albumin: 1.6 g/dL — ABNORMAL LOW (ref 3.5–5.0)
Alkaline Phosphatase: 67 U/L (ref 38–126)
Anion gap: 9 (ref 5–15)
BUN: 41 mg/dL — ABNORMAL HIGH (ref 8–23)
CO2: 21 mmol/L — ABNORMAL LOW (ref 22–32)
Calcium: 7.9 mg/dL — ABNORMAL LOW (ref 8.9–10.3)
Chloride: 105 mmol/L (ref 98–111)
Creatinine, Ser: 1.88 mg/dL — ABNORMAL HIGH (ref 0.61–1.24)
GFR, Estimated: 38 mL/min — ABNORMAL LOW (ref >60)
Glucose, Bld: 153 mg/dL — ABNORMAL HIGH (ref 70–99)
Potassium: 4.4 mmol/L (ref 3.5–5.1)
Sodium: 135 mmol/L (ref 135–145)
Total Bilirubin: 0.2 mg/dL (ref 0.0–1.2)
Total Protein: 4.8 g/dL — ABNORMAL LOW (ref 6.5–8.1)

## 2024-03-14 LAB — CULTURE, BLOOD (ROUTINE X 2)
Culture: NO GROWTH
Culture: NO GROWTH
Special Requests: ADEQUATE
Special Requests: ADEQUATE

## 2024-03-14 LAB — GLUCOSE, CAPILLARY
Glucose-Capillary: 144 mg/dL — ABNORMAL HIGH (ref 70–99)
Glucose-Capillary: 163 mg/dL — ABNORMAL HIGH (ref 70–99)
Glucose-Capillary: 94 mg/dL (ref 70–99)
Glucose-Capillary: 118 mg/dL — ABNORMAL HIGH (ref 70–99)
Glucose-Capillary: 127 mg/dL — ABNORMAL HIGH (ref 70–99)
Glucose-Capillary: 133 mg/dL — ABNORMAL HIGH (ref 70–99)

## 2024-03-14 MED ORDER — TORSEMIDE 20 MG PO TABS
20.0000 mg | ORAL_TABLET | Freq: Every day | ORAL | Status: DC
  Administered 2024-03-14 – 2024-03-15 (×2): 20 mg via ORAL
  Filled 2024-03-14 (×3): qty 1

## 2024-03-14 NOTE — Plan of Care (Signed)
  Problem: Clinical Measurements: Goal: Will remain free from infection Outcome: Progressing Goal: Diagnostic test results will improve Outcome: Progressing Goal: Respiratory complications will improve Outcome: Progressing   Problem: Nutrition: Goal: Adequate nutrition will be maintained Outcome: Progressing   Problem: Pain Managment: Goal: General experience of comfort will improve and/or be controlled Outcome: Progressing

## 2024-03-14 NOTE — Plan of Care (Signed)
  Problem: Education: Goal: Knowledge of General Education information will improve Description: Including pain rating scale, medication(s)/side effects and non-pharmacologic comfort measures Outcome: Progressing   Problem: Health Behavior/Discharge Planning: Goal: Ability to manage health-related needs will improve Outcome: Progressing   Problem: Clinical Measurements: Goal: Will remain free from infection Outcome: Progressing Goal: Diagnostic test results will improve Outcome: Progressing Goal: Respiratory complications will improve Outcome: Progressing Goal: Cardiovascular complication will be avoided Outcome: Progressing   Problem: Activity: Goal: Risk for activity intolerance will decrease Outcome: Progressing   Problem: Nutrition: Goal: Adequate nutrition will be maintained Outcome: Progressing   Problem: Coping: Goal: Level of anxiety will decrease Outcome: Progressing   Problem: Elimination: Goal: Will not experience complications related to bowel motility Outcome: Progressing Goal: Will not experience complications related to urinary retention Outcome: Progressing   Problem: Pain Managment: Goal: General experience of comfort will improve and/or be controlled Outcome: Progressing   Problem: Safety: Goal: Ability to remain free from injury will improve Outcome: Progressing   Problem: Skin Integrity: Goal: Risk for impaired skin integrity will decrease Outcome: Progressing   Problem: Education: Goal: Ability to describe self-care measures that may prevent or decrease complications (Diabetes Survival Skills Education) will improve Outcome: Progressing   Problem: Coping: Goal: Ability to adjust to condition or change in health will improve Outcome: Progressing   Problem: Fluid Volume: Goal: Ability to maintain a balanced intake and output will improve Outcome: Progressing   Problem: Health Behavior/Discharge Planning: Goal: Ability to identify and  utilize available resources and services will improve Outcome: Progressing Goal: Ability to manage health-related needs will improve Outcome: Progressing   Problem: Metabolic: Goal: Ability to maintain appropriate glucose levels will improve Outcome: Progressing   Problem: Nutritional: Goal: Maintenance of adequate nutrition will improve Outcome: Progressing Goal: Progress toward achieving an optimal weight will improve Outcome: Progressing   Problem: Skin Integrity: Goal: Risk for impaired skin integrity will decrease Outcome: Progressing   Problem: Tissue Perfusion: Goal: Adequacy of tissue perfusion will improve Outcome: Progressing   Problem: Activity: Goal: Ability to tolerate increased activity will improve Outcome: Progressing   Problem: Respiratory: Goal: Ability to maintain a clear airway and adequate ventilation will improve Outcome: Progressing   Problem: Role Relationship: Goal: Method of communication will improve Outcome: Progressing   Problem: Education: Goal: Ability to describe self-care measures that may prevent or decrease complications (Diabetes Survival Skills Education) will improve Outcome: Progressing Goal: Individualized Educational Video(s) Outcome: Progressing   Problem: Coping: Goal: Ability to adjust to condition or change in health will improve Outcome: Progressing   Problem: Fluid Volume: Goal: Ability to maintain a balanced intake and output will improve Outcome: Progressing   Problem: Health Behavior/Discharge Planning: Goal: Ability to identify and utilize available resources and services will improve Outcome: Progressing Goal: Ability to manage health-related needs will improve Outcome: Progressing   Problem: Metabolic: Goal: Ability to maintain appropriate glucose levels will improve Outcome: Progressing   Problem: Nutritional: Goal: Maintenance of adequate nutrition will improve Outcome: Progressing Goal: Progress  toward achieving an optimal weight will improve Outcome: Progressing   Problem: Skin Integrity: Goal: Risk for impaired skin integrity will decrease Outcome: Progressing   Problem: Tissue Perfusion: Goal: Adequacy of tissue perfusion will improve Outcome: Progressing

## 2024-03-14 NOTE — Progress Notes (Signed)
 Mobility Specialist: Progress Note   03/14/24 1200  Mobility  Activity Ambulated with assistance  Level of Assistance Contact guard assist, steadying assist  Assistive Device Front wheel walker  Distance Ambulated (ft) 100 ft (+ 80')  Activity Response Tolerated well  Mobility Referral Yes  Mobility visit 1 Mobility  Mobility Specialist Start Time (ACUTE ONLY) 0910  Mobility Specialist Stop Time (ACUTE ONLY) 0935  Mobility Specialist Time Calculation (min) (ACUTE ONLY) 25 min    Pt received in bed, agreeable to mobility session. SV for bed mobility. C/o pain during bed mobility d/t scrotum irritation. MinG for STS. CGA for ambulation. Ambulated ~100' and took a seated rest break, then completed an addition 3' before returning back to his room. Sat on the [facility] before returned to bed. Void and very small BM successful, MS assisted with pericare in standing. Returned to bed. Left in bed with all needs met, call bell in reach.   Ileana Lute Mobility Specialist Please contact via SecureChat or Rehab office at [phone]

## 2024-03-14 NOTE — Plan of Care (Signed)
  Problem: Clinical Measurements: Goal: Diagnostic test results will improve Outcome: Progressing   Problem: Activity: Goal: Risk for activity intolerance will decrease Outcome: Progressing   Problem: Nutrition: Goal: Adequate nutrition will be maintained Outcome: Progressing   Problem: Coping: Goal: Level of anxiety will decrease Outcome: Progressing   Problem: Pain Managment: Goal: General experience of comfort will improve and/or be controlled Outcome: Progressing   Problem: Safety: Goal: Ability to remain free from injury will improve Outcome: Progressing

## 2024-03-14 NOTE — Progress Notes (Addendum)
 Progress Note   Patient: Billy [name] FMW:[id number] DOB: 1950-06-11 DOA: 01/21/2024     53 DOS: the patient was seen and examined on 03/14/2024   Brief hospital course: 73 year old male Prolonged admission 3/10-07/20/18/2025 cardiac arrest requiring ICU management cardioverted for A-fib,-found to have DVTs, developed hematochezia on anticoagulation necessitating IVC filter, with recurrent GI bleed secondary to erosive gastropathy and friable duodenal mucosa and colonoscopy was deferred and then chest tube placements cardiac cath at that time showed CAD with CTO of RCA and was treated with diuretics and sent home on torsemide  Entresto  Jardiance .   Readmitted 10/5 with nausea vomiting sudden tearing sensation found to have an esophageal tear was given Kcentra    Significant Events  10/7 esophageal stent placed by cardiothoracic 10/15 stent reposition 10/16 extubated 10/20 reintubated with ultrasound showing left-sided pleural effusion-left-sided chest tube placed-by lights criteria exudative effusion 10/23 echo 60-65% 10/24 barium swallow showed persistent leak 10/28 transferred out of ICU 11/3 PEG tube placed 11/19 swelling of right arm showing on ultrasound SVT cephalic vein 11/22 chest x-ray?  Pneumonia done because of back pain-follow-up CT scan shows stent in place no mediastinal fluid collection small bilateral pleural effusions with bibasilar atelectasis pneumonia not excluded-scattered clusters nodular densities predominantly right upper and right lower lobes with nodule 11 mm as previously seen?  Mets?  Infection?  Aspiration 11/25 PEG tube placement x-ray shows no extravasation 11/26 esophageal stent removal with no evidence of extravasation on esophagram in OR   Assessment and Plan:  Boerhaave syndrome esophageal rupture-events as above-last esophagram 10/24 showed persistent leak --Continue IV Zosyn   -- Dr. Shyrl of CVTS following Status post esophageal stent removal  yesterday 11/26 --Started on clear liquids --G-tube feeds resumed per surgery --Continue free water  100 every 6 --Continue IV Protonix  40 twice daily  --Continue Maalox 30 mL every 4 as needed heart --Nodules seen on recent chest CT are likely secondary to aspiration /effusion per CVTS --Oxy IR 5 q4 moderate, IV Dilaudid  0.5 q3 prn severe pain --Diet advancement post stent removal per CTS  Upper back pain Musculoskeletal in nature imaging done as above 11/22 nonspecific-overall improved --Continue Flexeril  5 3 times daily as needed --Mobilize   Left arm swelling --Slightly improved with Lasix -continue lower dose of 20 daily  Superficial venous thrombosis noted as above --Monitor --Aready on Eliquis    Hypoxic respiratory failure multifactorial with pneumonia pleural effusion CHF --Continue Xopenex  inhalers Mucinex  continues on Lasix   Paroxysmal A-fib with DCCV 07/06/2023 11/27: Heart rates controlled in the 60s to 70s -- Amiodarone  200 twice daily  --metoprolol  as needed IV for heart rate above 110 --de-escalate off of amiodarone  depending on next steps with surgery? 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  Insomnia --Continue trazodone    Protein energy malnutrition BMI 21 --Supplements and vitamins --Dietitian following  CKD stage 3B -- stable  --Monitor  Metabolic acidosis - resolved --Monitor labs  Multiple bedsores and wounds anterior right and left foot and injury stage I to bottom --Continue Gerhardt twice daily  --Lidoderm  patch for back pain --Schedule Lomotil  1 tablet BID  and QID PRN --Diarrhea overall has improved  History of Cardiac arrest  06/2023      Subjective: Pt is POD-1 from esophageal stent removal yesterday.  On clear liquid diet and reports tolerating well.  No other acute complaints reported.  Physical Exam: Vitals:   03/13/24 1928 03/13/24 2339 03/14/24 0355 03/14/24 0700  BP: (!) 123/56 126/61 137/65 (!) 138/57  Pulse: 63 (!) 59 65 63  Resp: 20 20 18 20   Temp: 97.8 F (36.6 C) 98.1 F (36.7 C) 98.5 F (36.9 C) 97.6 F (36.4 C)  TempSrc: Oral Oral Oral Oral  SpO2: 93% 93% 96% 94%  Weight:   77.8 kg   Height:        General exam: awake, alert, no acute distress HEENT: moist mucus membranes, hearing grossly normal  Respiratory system: On room air, normal respiratory effort Cardiovascular system: RRR, upper extremity b/l edema.   Gastrointestinal system: soft, nondistended G-tube present Central nervous system: A&O x3. no gross focal neurologic deficits, normal speech Skin: dry, intact, normal temperature Psychiatry: normal mood, congruent affect, judgement and insight appear normal   Data Reviewed:  Notable labs: Glucose 101 BUN 36 Creatinine 1.79 stable Calcium  8.1 Albumin  1.8 ALT 66 Total protein 5.4  WBC 11.2 Hemoglobin 10.3 stable  Family Communication: None present.  Patient updated in detail  Disposition: Status is: Inpatient Remains inpatient appropriate because: Postop day 1 from esophageal stent removal, diet advancement underway.  Discharge will be pending clearance by cardiothoracic surgery.  Needs SNF placement   Planned Discharge Destination: Skilled nursing facility    Time spent: 38 minutes  Author: Burnard DELENA Cunning, DO 03/14/2024 10:48 AM  For on call review [email or web address].

## 2024-03-15 ENCOUNTER — Inpatient Hospital Stay (HOSPITAL_COMMUNITY)

## 2024-03-15 ENCOUNTER — Encounter (HOSPITAL_COMMUNITY): Payer: Self-pay | Admitting: Thoracic Surgery (Cardiothoracic Vascular Surgery)

## 2024-03-15 DIAGNOSIS — R109 Unspecified abdominal pain: Secondary | ICD-10-CM | POA: Diagnosis not present

## 2024-03-15 DIAGNOSIS — Z931 Gastrostomy status: Secondary | ICD-10-CM | POA: Diagnosis not present

## 2024-03-15 DIAGNOSIS — K223 Perforation of esophagus: Secondary | ICD-10-CM | POA: Diagnosis not present

## 2024-03-15 LAB — CBC WITH DIFFERENTIAL/PLATELET
Abs Immature Granulocytes: 0.09 K/uL — ABNORMAL HIGH (ref 0.00–0.07)
Basophils Absolute: 0.1 K/uL (ref 0.0–0.1)
Basophils Relative: 1 %
Eosinophils Absolute: 0.1 K/uL (ref 0.0–0.5)
Eosinophils Relative: 1 %
HCT: 33.7 % — ABNORMAL LOW (ref 39.0–52.0)
Hemoglobin: 10.5 g/dL — ABNORMAL LOW (ref 13.0–17.0)
Immature Granulocytes: 1 %
Lymphocytes Relative: 7 %
Lymphs Abs: 0.7 K/uL (ref 0.7–4.0)
MCH: 27.9 pg (ref 26.0–34.0)
MCHC: 31.2 g/dL (ref 30.0–36.0)
MCV: 89.4 fL (ref 80.0–100.0)
Monocytes Absolute: 0.7 K/uL (ref 0.1–1.0)
Monocytes Relative: 7 %
Neutro Abs: 8.8 K/uL — ABNORMAL HIGH (ref 1.7–7.7)
Neutrophils Relative %: 83 %
Platelets: 215 K/uL (ref 150–400)
RBC: 3.77 MIL/uL — ABNORMAL LOW (ref 4.22–5.81)
RDW: 17.1 % — ABNORMAL HIGH (ref 11.5–15.5)
WBC: 10.4 K/uL (ref 4.0–10.5)
nRBC: 0 % (ref 0.0–0.2)

## 2024-03-15 LAB — TYPE AND SCREEN
ABO/RH(D): A POS
Antibody Screen: NEGATIVE

## 2024-03-15 LAB — BASIC METABOLIC PANEL WITH GFR
Anion gap: 14 (ref 5–15)
BUN: 43 mg/dL — ABNORMAL HIGH (ref 8–23)
CO2: 22 mmol/L (ref 22–32)
Calcium: 7.8 mg/dL — ABNORMAL LOW (ref 8.9–10.3)
Chloride: 101 mmol/L (ref 98–111)
Creatinine, Ser: 1.97 mg/dL — ABNORMAL HIGH (ref 0.61–1.24)
GFR, Estimated: 36 mL/min — ABNORMAL LOW (ref >60)
Glucose, Bld: 142 mg/dL — ABNORMAL HIGH (ref 70–99)
Potassium: 3.9 mmol/L (ref 3.5–5.1)
Sodium: 137 mmol/L (ref 135–145)

## 2024-03-15 LAB — GLUCOSE, CAPILLARY
Glucose-Capillary: 114 mg/dL — ABNORMAL HIGH (ref 70–99)
Glucose-Capillary: 120 mg/dL — ABNORMAL HIGH (ref 70–99)
Glucose-Capillary: 122 mg/dL — ABNORMAL HIGH (ref 70–99)
Glucose-Capillary: 123 mg/dL — ABNORMAL HIGH (ref 70–99)
Glucose-Capillary: 123 mg/dL — ABNORMAL HIGH (ref 70–99)
Glucose-Capillary: 130 mg/dL — ABNORMAL HIGH (ref 70–99)
Glucose-Capillary: 133 mg/dL — ABNORMAL HIGH (ref 70–99)

## 2024-03-15 LAB — TROPONIN I (HIGH SENSITIVITY)
Troponin I (High Sensitivity): 51 ng/L — ABNORMAL HIGH (ref <18)
Troponin I (High Sensitivity): 56 ng/L — ABNORMAL HIGH (ref <18)

## 2024-03-15 MED ORDER — HYDRALAZINE HCL 20 MG/ML IJ SOLN
5.0000 mg | Freq: Once | INTRAMUSCULAR | Status: AC
  Administered 2024-03-15: 5 mg via INTRAVENOUS
  Filled 2024-03-15: qty 1

## 2024-03-15 MED ORDER — LACTATED RINGERS IV SOLN: INTRAVENOUS | Status: AC

## 2024-03-15 MED ORDER — SUCRALFATE 1 GM/10ML PO SUSP
1.0000 g | Freq: Three times a day (TID) | ORAL | Status: DC
  Administered 2024-03-15 – 2024-03-17 (×7): 1 g via ORAL
  Filled 2024-03-15 (×7): qty 10

## 2024-03-15 MED ORDER — LABETALOL HCL 5 MG/ML IV SOLN
10.0000 mg | INTRAVENOUS | Status: DC | PRN
  Administered 2024-03-15 – 2024-03-21 (×4): 10 mg via INTRAVENOUS
  Filled 2024-03-15 (×4): qty 4

## 2024-03-15 MED ORDER — DIAZEPAM 5 MG/ML IJ SOLN: 2.5000 mg | Freq: Four times a day (QID) | INTRAMUSCULAR | Status: DC | PRN

## 2024-03-15 NOTE — Progress Notes (Addendum)
 Brief Progress Note  Notified by primary team that patient having bloody emesis.  Emesis looks like old gastric contents, maybe a little old blood mixed in.  Vitals are stable with HR 70s, hypertensive 190s/70s.  Reports since last night he's having epigastric pain and nausea.  Nurse about to adminster phenergan .    Hgb up this morning from 9.8 to 10.5 WBC stable at 10.4 No recent coags  Vitals:   03/15/24 0345 03/15/24 0730  BP: (!) 157/68 (!) 198/77  Pulse: 62 73  Resp: 19 16  Temp: 97.7 F (36.5 C)   SpO2: 97% 95%   On exam, he is not tachycardic but does appear uncomfortable.  Abdominal exam is benign - no distention, no peritonitis, non-tender to palpation.  CT abd/pelvis without contrast obtained overnight.  No free air, no worsened pleural effusions, stomach looks fairly decompressed. There is a pocket of air alongside the esophagus that is stable and was there even prior to stent removal.  The small bowel is thickened concerning for enteritis which may be the source of pain.  Plan: - Recommend obtaining an active type and screen - Treat nausea - Could also try venting G-tube for symptom management although stomach isn't that distended on CT - Consider GI consult for management of duodenitis/enteritis  Con Clunes, MD Cardiothoracic Surgery Pager: [phone]

## 2024-03-15 NOTE — Significant Event (Signed)
 Patient complaining of increasing epigastric discomfort and had thrown up once.  KUB does not show anything acute.  Patient on exam at bedside still complains of discomfort.  Will get CT scan chest abdomen pelvis.  Will keep patient n.p.o. for now.  Checking troponins.  Redia Cleaver. MD

## 2024-03-15 NOTE — Progress Notes (Addendum)
 Nutrition Brief Note  Patient with esophageal stent removed Wednesday (11/26). TF re-started that day and diet advanced to clear liquid diet, per surgeon. He developed some upper extremity edema on 11/27 and torsemide  resumed. This morning he is with increased epigastric pain and multiple bouts of emesis that is maroon in color, per RN. Severe duodenitis on CT. GI was re-consulted. Trailing draining G-tube. RN reports blood clots observed. Surgery also recommending potentially venting of G-tube to manage symptoms, however notes abdomen does not appear distended. TFs on hold currently. He remains on clear liquid diet, but refusing meal trays, per RN. Will monitor for ability to re-start tube feedings. Can re-start at goal rate, when appropriate as he has been tolerating for some time.    INTERVENTION:  When able, continue TF via PEG: [place] Standard 1.4 to 65 ml/h (1560 ml per day) -Prosource TF20 60ml daily - FWF: 100 mL q6h (400 mL/day) -Provides 2264 kcal, 117 gm protein, 1508 ml free water  daily    Contnue Banatrol QID -provides 45kcal, 5g soluble fiber per serving Continue Florastor BID for re-establishing gut microbiome  Monitor diet advancement and tolerance and adjust TF regimen, as PO intake allows     NUTRITION DIAGNOSIS:    Severe Malnutrition related to acute illness (may have chronic component due to time frame of reported wt loss) as evidenced by severe fat depletion, moderate muscle depletion. - remains applicable   GOAL:  Patient will meet greater than or equal to 90% of their needs - meeting via TF; current NPO for surgery  Blair Deaner MS, RD, LDN Registered Dietitian Clinical Nutrition RD Inpatient Contact Info in Amion

## 2024-03-15 NOTE — Progress Notes (Addendum)
 Patient complains of upper abdominal pressure and bloating. Patient has active bowel sounds in all four quadrants and had bowel movement.  No improvement with simethicone  or maalox.  MD notified.  Verbal order given to pause tube feed and placed order for KUB. Bladder scan 131.    0645-pt had episode of vomiting-yellowish liquid. BP 181/81.  Requested MD to change morning PO hydralazine  to IV because currently not giving patient anything PO.  MD notified of emesis. IV hydralazine  given. EKG completed.

## 2024-03-15 NOTE — TOC Progression Note (Signed)
 Transition of Care [facility]) - Progression Note    Patient Details  Name: Billy [name] MRN: [id number] Date of Birth: 28-Nov-1950  Transition of Care [facility]) CM/SW Contact  Halla Chopp LITTIE [place], [place] Phone Number: 03/15/2024, 3:12 PM  Clinical Narrative:    CSW received insurance auth approval for [place]. Auth ID #[id number]. CSW awaiting bed confirmation with facility. CSW will continue to follow.   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, SNF Pending bed offer               Expected Discharge Plan and Services In-house Referral: Clinical Social Work Discharge Planning Services: [employer] Consult Post Acute Care Choice: Skilled Nursing Facility Living arrangements for the past 2 months: Single Family Home                                       Social Drivers of Health (SDOH) Interventions SDOH Screenings   Food Insecurity: No Food Insecurity (01/22/2024)  Housing: High Risk (01/22/2024)  Transportation Needs: No Transportation Needs (01/22/2024)  Utilities: Not At Risk (01/22/2024)  Depression (PHQ2-9): Low Risk  (12/01/2020)  Social Connections: Socially Isolated (01/22/2024)  Tobacco Use: Low Risk  (03/13/2024)    Readmission Risk Interventions    06/27/2023    1:52 PM 10/14/2022   11:29 AM  Readmission Risk Prevention Plan  Post Dischage Appt  Complete  Medication Screening  Complete  Transportation Screening Complete Complete  HRI or Home Care Consult Complete   Social Work Consult for Recovery Care Planning/Counseling Complete   Palliative Care Screening Not Applicable   Medication Review Oceanographer) Referral to Pharmacy

## 2024-03-15 NOTE — Plan of Care (Signed)
   Problem: Activity: Goal: Risk for activity intolerance will decrease Outcome: Progressing   Problem: Safety: Goal: Ability to remain free from injury will improve Outcome: Progressing   Problem: Skin Integrity: Goal: Risk for impaired skin integrity will decrease Outcome: Progressing

## 2024-03-15 NOTE — Progress Notes (Signed)
 Progress Note   Patient: Billy [name] FMW:[id number] DOB: 26-May-1950 DOA: 01/21/2024     54 DOS: the patient was seen and examined on 03/15/2024   Brief hospital course: 73 year old male Prolonged admission 3/10-07/20/18/2025 cardiac arrest requiring ICU management cardioverted for A-fib,-found to have DVTs, developed hematochezia on anticoagulation necessitating IVC filter, with recurrent GI bleed secondary to erosive gastropathy and friable duodenal mucosa and colonoscopy was deferred and then chest tube placements cardiac cath at that time showed CAD with CTO of RCA and was treated with diuretics and sent home on torsemide  Entresto  Jardiance .   Readmitted 10/5 with nausea vomiting sudden tearing sensation found to have an esophageal tear was given Kcentra    Significant Events  10/7 esophageal stent placed by cardiothoracic 10/15 stent reposition 10/16 extubated 10/20 reintubated with ultrasound showing left-sided pleural effusion-left-sided chest tube placed-by lights criteria exudative effusion 10/23 echo 60-65% 10/24 barium swallow showed persistent leak 10/28 transferred out of ICU 11/3 PEG tube placed 11/19 swelling of right arm showing on ultrasound SVT cephalic vein 11/22 chest x-ray?  Pneumonia done because of back pain-follow-up CT scan shows stent in place no mediastinal fluid collection small bilateral pleural effusions with bibasilar atelectasis pneumonia not excluded-scattered clusters nodular densities predominantly right upper and right lower lobes with nodule 11 mm as previously seen?  Mets?  Infection?  Aspiration 11/25 PEG tube placement x-ray shows no extravasation 11/26 esophageal stent removal with no evidence of extravasation on esophagram in OR 11/27 torsemide  resumed for upper extremity edema 11/28 increased epigastric pain and N/V.  Severe duodenitis on repeat CT.  GI re-consulted.   Assessment and Plan:  Boerhaave syndrome esophageal rupture-events as above-last  esophagram 10/24 showed persistent leak Severe duodenitis -- on CT scan today 11/28, obtained for increased epigastric pain and nausea/vomiting. --Continue IV Zosyn   -- Dr. Shyrl of CVTS following. Status post esophageal stent removal on 11/26 Case discussed with on-call surgeon Dr. Daniel this AM --Started on clear liquids --G-tube feeds resumed per surgery --Continue free water  100 every 6  11/28: --Already on IV Protonix  BID -- continue --GI re-consulted for Duodenitis --Trial draining G tube  --Continue Maalox 30 mL every 4 as needed heart --Nodules seen on recent chest CT are likely secondary to aspiration /effusion per CVTS --Oxy IR 5 q4 moderate, IV Dilaudid  0.5 q3 prn severe pain --Diet advancement post stent removal per CTS   Upper back pain Musculoskeletal in nature imaging done as above 11/22 nonspecific-overall improved --Continue Flexeril  5 3 times daily as needed --Mobilize   Left arm swelling --Slightly improved with Lasix -continue lower dose of 20 daily  Superficial venous thrombosis noted as above --Monitor --Aready on Eliquis    Hypoxic respiratory failure multifactorial with pneumonia pleural effusion CHF --Continue Xopenex  inhalers Mucinex  continues on Lasix   Paroxysmal A-fib with DCCV 07/06/2023 11/28: Heart rates controlled in the 60s to 70s -- Amiodarone  200 twice daily  --metoprolol  as needed IV for heart rate above 110 --de-escalate off of amiodarone  depending on next steps with surgery? 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  Insomnia --Continue trazodone    Protein energy malnutrition BMI 21 --Supplements and vitamins --Dietitian following  CKD stage 3B -- stable  --Monitor  Metabolic acidosis - resolved --Monitor labs  Multiple bedsores and wounds anterior right and left foot and injury stage I to bottom --Continue Gerhardt twice daily  --Lidoderm  patch for back pain --Schedule Lomotil  1 tablet BID and QID PRN --Diarrhea overall has improved  History of Cardiac arrest 06/2023      Subjective: Pt having increased epigastric abdominal pain early this AM, and nausea/vomiting.  Emesis was reported as bloody.  CT scan ordered before change of shift. Radiology called to inform of new severe wall thickening of the proximal duodenum.  Pt asking to be seen by GI as soon as possible.     Physical Exam: Vitals:   03/15/24 0345 03/15/24 0730 03/15/24 1222 03/15/24 1226  BP: (!) 157/68 (!) 198/77 (!) 189/79 (!) 193/82  Pulse: 62 73  77  Resp: 19 16  20   Temp: 97.7 F (36.5 C)   98.1 F (36.7 C)  TempSrc: Oral   Oral  SpO2: 97% 95%  98%  Weight: 78.8 kg     Height:        General exam: awake, alert, no acute distress HEENT: moist mucus membranes, hearing grossly normal  Respiratory system: On room air, normal respiratory effort Cardiovascular system: RRR, upper extremity b/l edema.   Gastrointestinal system: soft, nondistended G-tube present Central nervous system: A&O x3. no gross focal neurologic deficits, normal speech Skin: dry, intact, normal temperature Psychiatry: normal mood, congruent affect, judgement and insight appear normal   Data Reviewed:  Notable labs: Glucose 142 BUN 43 Creatinine 1.79 >> 197 Calcium  7.8  Hemoglobin 10.3 >> 10.5 stable  HS-troponin 51 >> 56   CT chest/abdomen/pelvis this morning --- new severe duodenitis: IMPRESSION: 1. Severe wall thickening involving the  duodenum and proximal jejunum. These findings are new and concerning for severe enteritis. Etiology is uncertain. 2. Interval removal of the esophageal stent. Again noted is a pocket of gas along the left side of the mid/distal esophagus. No new mediastinal air. 3. Persistent small bilateral pleural effusions. 4. Patchy parenchymal densities in the right middle lobe and right lower lobe are concerning for areas of atelectasis and infection. 5. Multiple nodular densities in the right lung. These are new since 2019 and will need follow-up to exclude a neoplastic process. 6. Question mild wall thickening in the terminal ileum. 7. Atrophic left kidney.   Abdominal X-ray this AM --- no acute findings.  G tube in expected location.  IVC filter in place.    Family Communication: None present.  Patient updated in detail  Disposition: Status is: Inpatient Remains inpatient appropriate because: Postop day 1 from esophageal stent removal, diet advancement underway.  Discharge will be pending clearance by cardiothoracic surgery.  Needs SNF placement   Planned Discharge Destination: Skilled nursing facility     Time spent: 55 minutes including time at bedside and coordination of care with staff and consultants.   Author: Burnard DELENA Cunning, DO 03/15/2024 1:00 PM  For on call review [email or web address].

## 2024-03-15 NOTE — Progress Notes (Addendum)
 [facility] Progress Note  Billy [name] 73 y.o. 1951/02/16   Subjective: Lying in bed complaining of epigastric pain. Reports vomiting 3 times today.  Objective: Vital signs: Vitals:   03/15/24 1222 03/15/24 1226  BP: (!) 189/79 (!) 193/82  Pulse:  77  Resp:  20  Temp:  98.1 F (36.7 C)  SpO2:  98%    Physical Exam: Gen: chronically ill-appearing, lethargic, elderly, thin, no acute distress  HEENT: anicteric sclera CV: RRR Chest: CTA B Abd: diffuse tenderness with guarding, soft, nondistended, +BS Ext: no edema  Lab Results: Recent Labs    03/14/24 0435 03/15/24 0354  NA 135 137  K 4.4 3.9  CL 105 101  CO2 21* 22  GLUCOSE 153* 142*  BUN 41* 43*  CREATININE 1.88* 1.97*  CALCIUM  7.9* 7.8*   Recent Labs    03/13/24 0520 03/14/24 0435  AST 41 38  ALT 66* 75*  ALKPHOS 65 67  BILITOT 0.8 0.2  PROT 5.4* 4.8*  ALBUMIN  1.8* 1.6*   Recent Labs    03/14/24 0435 03/15/24 0354  WBC 10.4 10.4  NEUTROABS 9.3* 8.8*  HGB 9.8* 10.5*  HCT 31.7* 33.7*  MCV 90.1 89.4  PLT 220 215      Assessment/Plan: Boerhaave syndrome with esophageal rupture in October with esophageal stent placed 10/7 and PEG placed 11/3. Esophageal stent removed 11/26 and today has had worsened epigastric pain and N/V. CT shows new inflammation of the duodenum and proximal jejunum. Pocket of gas noted on the left side of the mid/distal esophagus on CT that has been seen before. Would treat medically with Sucralfate slurry. Anti-emetics prn. Would not recommend an EGD in the setting of recent esophageal perforation and removal of esophageal stent. Supportive care. Dr. Burnette will f/u tomorrow.  Billy [name] 03/15/2024, 3:53 PM  Questions please call [phone] ID: Billy [name], male   DOB: 11/14/1950, 73 y.o.   MRN: [id number]

## 2024-03-15 NOTE — Progress Notes (Signed)
 PT Cancellation Note  Patient Details Name: Billy [name] MRN: [id number] DOB: [birth year]   Cancelled Treatment:    Reason Eval/Treat Not Completed: Other (comment)  Pt has had a rough day. Spoke with RN; he has been vomiting, abdominal pain, elevated BP, and now getting g-tube drained. Will hold visit at this time and follow-up when he can tolerate mobilizing a bit better. Please send me a secure chat for any urgent follow-up needs.  Leontine Roads, PT, DPT [facility]  Rehabilitation Services Physical Therapist Office: [phone] Website: [email or web address]   [name] 03/15/2024, 4:42 PM

## 2024-03-16 DIAGNOSIS — K223 Perforation of esophagus: Secondary | ICD-10-CM | POA: Diagnosis not present

## 2024-03-16 LAB — BASIC METABOLIC PANEL WITH GFR
Anion gap: 10 (ref 5–15)
BUN: 30 mg/dL — ABNORMAL HIGH (ref 8–23)
CO2: 23 mmol/L (ref 22–32)
Calcium: 8.1 mg/dL — ABNORMAL LOW (ref 8.9–10.3)
Chloride: 106 mmol/L (ref 98–111)
Creatinine, Ser: 1.51 mg/dL — ABNORMAL HIGH (ref 0.61–1.24)
GFR, Estimated: 49 mL/min — ABNORMAL LOW (ref >60)
Glucose, Bld: 107 mg/dL — ABNORMAL HIGH (ref 70–99)
Potassium: 4 mmol/L (ref 3.5–5.1)
Sodium: 139 mmol/L (ref 135–145)

## 2024-03-16 LAB — GLUCOSE, CAPILLARY
Glucose-Capillary: 111 mg/dL — ABNORMAL HIGH (ref 70–99)
Glucose-Capillary: 120 mg/dL — ABNORMAL HIGH (ref 70–99)
Glucose-Capillary: 116 mg/dL — ABNORMAL HIGH (ref 70–99)
Glucose-Capillary: 89 mg/dL (ref 70–99)
Glucose-Capillary: 116 mg/dL — ABNORMAL HIGH (ref 70–99)

## 2024-03-16 LAB — CBC
HCT: 34.2 % — ABNORMAL LOW (ref 39.0–52.0)
Hemoglobin: 10.6 g/dL — ABNORMAL LOW (ref 13.0–17.0)
MCH: 27.6 pg (ref 26.0–34.0)
MCHC: 31 g/dL (ref 30.0–36.0)
MCV: 89.1 fL (ref 80.0–100.0)
Platelets: 204 K/uL (ref 150–400)
RBC: 3.84 MIL/uL — ABNORMAL LOW (ref 4.22–5.81)
RDW: 17 % — ABNORMAL HIGH (ref 11.5–15.5)
WBC: 12.5 K/uL — ABNORMAL HIGH (ref 4.0–10.5)
nRBC: 0 % (ref 0.0–0.2)

## 2024-03-16 LAB — PHOSPHORUS: Phosphorus: 2.4 mg/dL — ABNORMAL LOW (ref 2.5–4.6)

## 2024-03-16 LAB — MAGNESIUM: Magnesium: 2 mg/dL (ref 1.7–2.4)

## 2024-03-16 MED ORDER — HYDRALAZINE HCL 50 MG PO TABS: 50.0000 mg | ORAL_TABLET | Freq: Three times a day (TID) | ORAL | Status: DC

## 2024-03-16 MED ORDER — METOPROLOL TARTRATE 25 MG/10 ML ORAL SUSPENSION
12.5000 mg | Freq: Two times a day (BID) | ORAL | Status: DC
  Administered 2024-03-16 – 2024-03-22 (×9): 12.5 mg
  Filled 2024-03-16 (×14): qty 5

## 2024-03-16 MED ORDER — HYDRALAZINE HCL 20 MG/ML IJ SOLN
10.0000 mg | INTRAMUSCULAR | Status: DC | PRN
  Administered 2024-03-18 – 2024-03-19 (×6): 10 mg via INTRAVENOUS
  Filled 2024-03-16 (×7): qty 1

## 2024-03-16 MED ORDER — AMIODARONE HCL 200 MG PO TABS
200.0000 mg | ORAL_TABLET | Freq: Every day | ORAL | Status: DC
  Administered 2024-03-17 – 2024-03-26 (×8): 200 mg
  Filled 2024-03-16 (×8): qty 1

## 2024-03-16 NOTE — Progress Notes (Signed)
 Subjective: Nausea/vomiting and abdominal discomfort improved.  Objective: Vital signs in last 24 hours: Temp:  [98 F (36.7 C)-98.5 F (36.9 C)] 98.5 F (36.9 C) (11/29 1145) Pulse Rate:  [68-77] 77 (11/29 1145) Resp:  [15-20] 17 (11/29 1145) BP: (166-199)/(69-86) 193/86 (11/29 1145) SpO2:  [94 %-98 %] 98 % (11/29 1145) Weight:  [76.6 kg] 76.6 kg (11/29 0456) Weight change: -2.2 kg Last BM Date : 03/15/24  PE: GEN:  NAD LUNGS:  No visible distress ABD:  Soft, PEG in place NEURO:  No encephalopathy.  Lab Results: CBC    Component Value Date/Time   WBC 12.5 (H) 03/16/2024 0830   RBC 3.84 (L) 03/16/2024 0830   HGB 10.6 (L) 03/16/2024 0830   HGB 12.0 (L) 09/27/2023 0846   HCT 34.2 (L) 03/16/2024 0830   HCT 40.5 09/27/2023 0846   PLT 204 03/16/2024 0830   PLT 238 09/27/2023 0846   MCV 89.1 03/16/2024 0830   MCV 88 09/27/2023 0846   MCH 27.6 03/16/2024 0830   MCHC 31.0 03/16/2024 0830   RDW 17.0 (H) 03/16/2024 0830   RDW 17.3 (H) 09/27/2023 0846   LYMPHSABS 0.7 03/15/2024 0354   MONOABS 0.7 03/15/2024 0354   EOSABS 0.1 03/15/2024 0354   BASOSABS 0.1 03/15/2024 0354  CMP     Component Value Date/Time   NA 139 03/16/2024 0830   NA 141 01/01/2024 0813   K 4.0 03/16/2024 0830   CL 106 03/16/2024 0830   CO2 23 03/16/2024 0830   GLUCOSE 107 (H) 03/16/2024 0830   BUN 30 (H) 03/16/2024 0830   BUN 42 (H) 01/01/2024 0813   CREATININE 1.51 (H) 03/16/2024 0830   CALCIUM  8.1 (L) 03/16/2024 0830   PROT 4.8 (L) 03/14/2024 0435   PROT 6.1 01/01/2024 0813   ALBUMIN  1.6 (L) 03/14/2024 0435   ALBUMIN  3.8 (L) 01/01/2024 0813   AST 38 03/14/2024 0435   ALT 75 (H) 03/14/2024 0435   ALKPHOS 67 03/14/2024 0435   BILITOT 0.2 03/14/2024 0435   BILITOT <0.2 01/01/2024 0813   EGFR 31 (L) 01/01/2024 0813   GFRNONAA 49 (L) 03/16/2024 0830    Assessment:   Duodenitis/enteritis on CT. Boerhaave's syndrome s/p esophageal stent October and removal earlier this month. Nausea,  abdominal discomfort.  Plan:   PPI + sucralfate. We have no further plans from GI perspective. Questions on management of diet and G tube would defer to CT surgery, who placed/adjusted/then removed esophageal stent and directed placement of PEG tube. [facility] will sign-off.   Billy [name] 03/16/2024, 12:53 PM   Cell [phone] If no answer or after 5 PM call [phone]

## 2024-03-16 NOTE — TOC Progression Note (Addendum)
 Transition of Care [facility]) - Progression Note    Patient Details  Name: Rockney Grenz MRN: [id number] Date of Birth: 12/20/1950  Transition of [facility]) CM/SW Contact  Isaiah Public, LCSWA Phone Number: 03/16/2024, 4:27 PM  Clinical Narrative:     Patients insurance authorization approved for [facility]. [name] with [facility] confirmed SNF bed for patient when medically ready. CSW informed MD.TOC will continue to follow.  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, SNF Pending bed offer               Expected Discharge Plan and Services In-house Referral: Clinical Social Work Discharge Planning Services: [employer] Consult Post Acute Care Choice: Skilled Nursing Facility Living arrangements for the past 2 months: Single Family Home                                       Social Drivers of Health (SDOH) Interventions SDOH Screenings   Food Insecurity: No Food Insecurity (01/22/2024)  Housing: High Risk (01/22/2024)  Transportation Needs: No Transportation Needs (01/22/2024)  Utilities: Not At Risk (01/22/2024)  Depression (PHQ2-9): Low Risk  (12/01/2020)  Social Connections: Socially Isolated (01/22/2024)  Tobacco Use: Low Risk  (03/13/2024)    Readmission Risk Interventions    06/27/2023    1:52 PM 10/14/2022   11:29 AM  Readmission Risk Prevention Plan  Post Dischage Appt  Complete  Medication Screening  Complete  Transportation Screening Complete Complete  HRI or Home Care Consult Complete   Social Work Consult for Recovery Care Planning/Counseling Complete   Palliative Care Screening Not Applicable   Medication Review Oceanographer) Referral to Pharmacy

## 2024-03-16 NOTE — Progress Notes (Signed)
 Physical Therapy Treatment Patient Details Name: Billy [name] MRN: [id number] DOB: [birth year] Today's Date: 03/16/2024   History of Present Illness Billy [name] is a 73 y.o. male who presented to [facility] ED 01/21/24 with several days of vomiting followed by an episode of severe tearing epigastric pain. Work-up revealed esophageal tear. Pt s/p esophageal stent with EGD 10/7. Return to OR for esophageal stent adjustment on 10/15, remained intubated after procedure due to suspected aspiration. Extubated 10/16.  Pt had CT placed 10/16. Reintubated 10/20-10/24. G tube placed 11/3. He accidentally pulled out the R chest tube on 11/7.L chest tube removed 11/11. Found to have superficial venous thrombosis of RUE 11/19. N&V with epigastirc pain 11/28,   found severe duodenitis on repeat CT. PMHx: HFrEF, AFib, CAD, PE complicated by cardiogenic shock, CKD 4, MI, peripheral neuropathy, and TIA.    PT Comments  Making steady progress towards acute functional goals despite intermittent set-backs. Able to ambulate >200 feet today with infrequent min assist for RW control, approaching at CGA level with RW. VSS throughout on room air with moderate dyspnea, but recovers with short standing rest breaks. CGA for transfers with reliance on RW as well. Agreeable to sit up in recliner for a while after therapy session. Patient will continue to benefit from skilled physical therapy services to further improve independence with functional mobility. Goals updated as appropriate. Will progress recs as appropriate.     If plan is discharge home, recommend the following: A little help with walking and/or transfers;A little help with bathing/dressing/bathroom;Assistance with cooking/housework;Direct supervision/assist for medications management;Assist for transportation;Help with stairs or ramp for entrance   Can travel by private vehicle     Yes  Equipment Recommendations  Rolling walker (2 wheels)    Recommendations for Other  Services       Precautions / Restrictions Precautions Precautions: Fall Recall of Precautions/Restrictions: Impaired Precaution/Restrictions Comments: G-tube Restrictions Weight Bearing Restrictions Per Provider Order: No     Mobility  Bed Mobility Overal bed mobility: Needs Assistance Bed Mobility: Supine to Sit     Supine to sit: Contact guard, HOB elevated, Used rails     General bed mobility comments: CGA for safety, some discomfort reported, requires extra time.    Transfers Overall transfer level: Needs assistance Equipment used: Rolling walker (2 wheels) Transfers: Sit to/from Stand Sit to Stand: Contact guard assist           General transfer comment: CGA for safety to rise from EOB, with cues for set-up, hand placement and technique.    Ambulation/Gait Ambulation/Gait assistance: Min assist Gait Distance (Feet): 225 Feet Assistive device: Rolling walker (2 wheels) Gait Pattern/deviations: Step-through pattern, Trunk flexed, Decreased stance time - left, Drifts right/left Gait velocity: reduced Gait velocity interpretation: <1.8 ft/sec, indicate of risk for recurrent falls   General Gait Details: Majority of distance at [facility] level but required min assist at times to navigate around obstacles with cues for awareness and anticipation. No buckling noted, no overt LOB. Required 2 standing rest breaks to complete distance. SpO2 upper 90s on RA throughout visit with mild-moderate dyspnea with exertion. Cues for upright stance and gaze throughout.   Stairs             Wheelchair Mobility     Tilt Bed    Modified Rankin (Stroke Patients Only)       Balance Overall balance assessment: Needs assistance Sitting-balance support: Feet supported, No upper extremity supported Sitting balance-Leahy Scale: Good Sitting balance - Comments: EOB  Standing balance support: Reliant on assistive device for balance, During functional activity, Bilateral upper  extremity supported Standing balance-Leahy Scale: Poor Standing balance comment: Dependent on RW                            Communication Communication Communication: Impaired Factors Affecting Communication: Hearing impaired  Cognition Arousal: Alert Behavior During Therapy: Flat affect   PT - Cognitive impairments: Attention, Awareness, Problem solving, Safety/Judgement                         Following commands: Impaired Following commands impaired: Follows multi-step commands inconsistently    Cueing Cueing Techniques: Verbal cues, Gestural cues  Exercises General Exercises - Lower Extremity Ankle Circles/Pumps: AROM, Both, 10 reps, Seated Quad Sets: Strengthening, Both, 10 reps, Seated Gluteal Sets: Strengthening, Both, 10 reps, Seated    General Comments General comments (skin integrity, edema, etc.): VSS on RA throguhout.      Pertinent Vitals/Pain Pain Assessment Pain Assessment: Faces Faces Pain Scale: Hurts little more Pain Location: upper abdomen Pain Descriptors / Indicators: Discomfort Pain Intervention(s): Limited activity within patient's tolerance, Monitored during session, Repositioned    Home Living                          Prior Function            PT Goals (current goals can now be found in the care plan section) Acute Rehab PT Goals Patient Stated Goal: To get stronger before I go home PT Goal Formulation: With patient Time For Goal Achievement: 03/15/24 Potential to Achieve Goals: Good Progress towards PT goals: Progressing toward goals    Frequency    Min 2X/week      PT Plan      Co-evaluation              AM-PAC PT 6 Clicks Mobility   Outcome Measure  Help needed turning from your back to your side while in a flat bed without using bedrails?: A Little Help needed moving from lying on your back to sitting on the side of a flat bed without using bedrails?: A Little Help needed moving to  and from a bed to a chair (including a wheelchair)?: A Little Help needed standing up from a chair using your arms (e.g., wheelchair or bedside chair)?: A Little Help needed to walk in hospital room?: A Little Help needed climbing 3-5 steps with a railing? : A Lot 6 Click Score: 17    End of Session Equipment Utilized During Treatment: Gait belt Activity Tolerance: Patient tolerated treatment well Patient left: in chair;with call bell/phone within reach;with chair alarm set Nurse Communication: Mobility status PT Visit Diagnosis: Difficulty in walking, not elsewhere classified (R26.2);Unsteadiness on feet (R26.81);Other abnormalities of gait and mobility (R26.89);Muscle weakness (generalized) (M62.81);Other symptoms and signs involving the nervous system (R29.898);Pain;History of falling (Z91.81) Pain - part of body:  (Abdomen area,)     Time: 8548-8486 PT Time Calculation (min) (ACUTE ONLY): 22 min  Charges:    $Gait Training: 8-22 mins PT General Charges $$ ACUTE PT VISIT: 1 Visit                     Leontine Roads, PT, DPT [facility]  Rehabilitation Services Physical Therapist Office: [phone] Website: [email or web address]    [name] 03/16/2024, 4:33 PM

## 2024-03-16 NOTE — Progress Notes (Signed)
 Progress Note   Patient: Billy [name] FMW:[id number] DOB: 04-24-1950 DOA: 01/21/2024     73 DOS: the patient was seen and examined on 03/16/2024   Brief hospital course: 73 year old male Prolonged admission 3/10-07/20/18/2025 cardiac arrest requiring ICU management cardioverted for A-fib,-found to have DVTs, developed hematochezia on anticoagulation necessitating IVC filter, with recurrent GI bleed secondary to erosive gastropathy and friable duodenal mucosa and colonoscopy was deferred and then chest tube placements cardiac cath at that time showed CAD with CTO of RCA and was treated with diuretics and sent home on torsemide  Entresto  Jardiance .   Readmitted 10/5 with nausea vomiting sudden tearing sensation found to have an esophageal tear was given Kcentra    Significant Events  10/7 esophageal stent placed by cardiothoracic 10/15 stent reposition 10/16 extubated 10/20 reintubated with ultrasound showing left-sided pleural effusion-left-sided chest tube placed-by lights criteria exudative effusion 10/23 echo 60-65% 10/24 barium swallow showed persistent leak 10/28 transferred out of ICU 11/3 PEG tube placed 11/19 swelling of right arm showing on ultrasound SVT cephalic vein 11/22 chest x-ray?  Pneumonia done because of back pain-follow-up CT scan shows stent in place no mediastinal fluid collection small bilateral pleural effusions with bibasilar atelectasis pneumonia not excluded-scattered clusters nodular densities predominantly right upper and right lower lobes with nodule 11 mm as previously seen?  Mets?  Infection?  Aspiration 11/25 PEG tube placement x-ray shows no extravasation 11/26 esophageal stent removal with no evidence of extravasation on esophagram in OR 11/27 torsemide  resumed for upper extremity edema 11/28 increased epigastric pain and N/V.  Severe duodenitis on repeat CT.  GI re-consulted.  Made NPO+ice, tube feeds stopped and G-tube placed to gravity drain 11/29 N/V  improved. Still epigastric pain but improved.  Re-trial clear liquids with G tube off drain   Assessment and Plan:  Boerhaave syndrome esophageal rupture-events as above-last esophagram 10/24 showed persistent leak Severe duodenitis -- on CT scan 11/28, obtained for increased epigastric pain and nausea/vomiting overnight. --Continue IV Zosyn   -- Dr. Shyrl of [facility] following. Status post esophageal stent removal on 11/26 Case discussed with on-call surgeon Dr. Daniel 11/28 AM --G-tube feeds resumed per surgery -- held 11/28>>11/29. See below. --Continue free water  100 Q6H --Continue IV PPI BID  11/28: --GI re-consulted for Duodenitis -- recommended carafate and PRN antiemetics. No EGD given recent course above --Trial draining G tube  11/29: --re-try on clear liquids and G-tube off draining --if tolerating clears by mouth, can trial on resuming tube feeds later today --Continue carafate per GI --if worsened pain or N/V with above, will go back to NPO + ice chips and G tube draining  --Continue Maalox 30 mL PRN --Nodules seen on recent chest CT are likely secondary to aspiration /effusion per CVTS --Oxy IR 5 q4 moderate, IV Dilaudid  0.5 q3 prn severe pain --Diet advancement per CTS   Upper back pain Musculoskeletal in nature imaging done as above 11/22 nonspecific-overall improved --Continue Flexeril  5 3 times daily as needed --Mobilize   Left arm swelling --Slightly improved with Lasix  --Now resumed on home torsemide   Superficial venous thrombosis noted as above --Monitor --Aready on Eliquis    Hypoxic respiratory failure multifactorial with pneumonia pleural effusion CHF.  Improved with Lasix  and antibiotics. 11/29 O2 sats remain stable on room air --Continue Xopenex  inhalers Mucinex   --Resumed on home torsemide   Paroxysmal A-fib with DCCV 07/06/2023 11/28: Heart rates controlled in the 60s to 70s -- Amiodarone  200 twice daily  --metoprolol  as needed IV for heart rate  above 110 --de-escalate off  of amiodarone  depending on next steps with surgery? --reduce amiodarone  from 200 mg BID >> daily & wean off as able.  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 --Continue Eliquis  5 twice daily    Uncontrolled blood pressure-- Hyperlipidemia 11/28--29 -- elevated BP's to systolic 180's-190's in the setting of severe abdominal pain and inability to keep down PO meds by tube with vomiting --Increase IV hydralazine  frequency --IV labetalol  and IV hydralazine  PRN --Titrate regimen --Continue: Hydralazine  100 mg Q8H atorvastatin  80  Zetia  10  Isordil  10 twice daily- metoprolol  6.25 BID >> increase to 12.5 mg BID  losartan  25 daily additionally-periodic labs Resumed home torsemide  11/27  --Monitor BP and renal function  Bipolar disorder -- overall stable --Appears not to be on medications.  Insomnia --Continue trazodone    Protein energy malnutrition BMI 21 --Supplements and vitamins --Dietitian following  CKD stage 3B -- stable  11/29 - Cr improved 1.97 >> 1.51 --Monitor  Metabolic acidosis - resolved --Monitor labs  Multiple bedsores and wounds anterior right and left foot and injury stage I to bottom --Continue Gerhardt twice daily  --Lidoderm  patch for back pain --Schedule Lomotil  1 tablet BID and QID PRN --Diarrhea overall has improved  History of Cardiac arrest 06/2023      Subjective: Pt seen awake sitting up in bed this AM.  He reports ongoing upper abdominal pain today maybe a little less severe than yesterday.  No nausea/vomiting today.  No fever/chills or other current complaints.       Physical Exam: Vitals:   03/16/24 0456 03/16/24 0500 03/16/24 0600 03/16/24 0700  BP:  (!) 198/81 (!) 199/85 (!) 181/85  Pulse:  71 77 69  Resp:  18 19 15   Temp:  98.4 F (36.9 C)  98.4 F (36.9 C)  TempSrc:  Oral  Oral  SpO2:  94% 98% 98%  Weight: 76.6 kg     Height:        General  exam: awake, alert, no acute distress HEENT: moist mucus membranes, hearing grossly normal  Respiratory system: CTAB no wheezes or rhonchi, On room air, normal respiratory effort Cardiovascular system: normal S1/S2, RRR, upper extremity b/l edema.   Gastrointestinal system: soft, nondistended G-tube present currently draining with minimal dark fluid in bag Central nervous system: A&O x3. no gross focal neurologic deficits, normal speech Skin: dry, intact, normal temperature Psychiatry: normal mood, congruent affect, judgement and insight appear normal   Data Reviewed:  Notable labs: Glucose 107 BUN 43 >> 30 Creatinine 1.79 >> 1.97 >> 1.51 improved Calcium  8.1 Phos 2.4  Hemoglobin 10.3 >> 10.5 >> 10.6 stable WBC mildly elevated 12.5 suspect reactive   CT chest/abdomen/pelvis 11/28 AM --- new severe duodenitis: IMPRESSION: 1. Severe wall thickening involving the duodenum and proximal jejunum. These findings are new and concerning for severe enteritis. Etiology is uncertain. 2. Interval removal of the esophageal stent. Again noted is a pocket of gas along the left side of the mid/distal esophagus. No new mediastinal air. 3. Persistent small bilateral pleural effusions. 4. Patchy parenchymal densities in the right middle lobe and right lower lobe are concerning for areas of atelectasis and infection. 5. Multiple nodular densities in the right lung. These are new since 2019 and will need follow-up to exclude a neoplastic process. 6. Question mild wall thickening in the terminal ileum. 7. Atrophic left kidney.      Family Communication: None present.  Patient updated in detail  Disposition: Status is: Inpatient Remains inpatient appropriate because: remains on IV therapies, pain uncontrolled.  D/c will be pending advancement of diet and clearance by cardiothoracic surgery.  Pending SNF placement once medically clear.   Planned Discharge Destination: Skilled nursing  facility     Time spent: 45 minutes   Author: Burnard DELENA Cunning, DO 03/16/2024 11:29 AM  For on call review [email or web address].

## 2024-03-17 DIAGNOSIS — K223 Perforation of esophagus: Secondary | ICD-10-CM | POA: Diagnosis not present

## 2024-03-17 LAB — BASIC METABOLIC PANEL WITH GFR
Anion gap: 9 (ref 5–15)
BUN: 29 mg/dL — ABNORMAL HIGH (ref 8–23)
CO2: 23 mmol/L (ref 22–32)
Calcium: 7.9 mg/dL — ABNORMAL LOW (ref 8.9–10.3)
Chloride: 105 mmol/L (ref 98–111)
Creatinine, Ser: 1.58 mg/dL — ABNORMAL HIGH (ref 0.61–1.24)
GFR, Estimated: 46 mL/min — ABNORMAL LOW (ref >60)
Glucose, Bld: 123 mg/dL — ABNORMAL HIGH (ref 70–99)
Potassium: 3.8 mmol/L (ref 3.5–5.1)
Sodium: 137 mmol/L (ref 135–145)

## 2024-03-17 LAB — CBC
HCT: 29.9 % — ABNORMAL LOW (ref 39.0–52.0)
Hemoglobin: 9.4 g/dL — ABNORMAL LOW (ref 13.0–17.0)
MCH: 28.1 pg (ref 26.0–34.0)
MCHC: 31.4 g/dL (ref 30.0–36.0)
MCV: 89.3 fL (ref 80.0–100.0)
Platelets: 197 K/uL (ref 150–400)
RBC: 3.35 MIL/uL — ABNORMAL LOW (ref 4.22–5.81)
RDW: 17.1 % — ABNORMAL HIGH (ref 11.5–15.5)
WBC: 10 K/uL (ref 4.0–10.5)
nRBC: 0 % (ref 0.0–0.2)

## 2024-03-17 LAB — GLUCOSE, CAPILLARY
Glucose-Capillary: 116 mg/dL — ABNORMAL HIGH (ref 70–99)
Glucose-Capillary: 121 mg/dL — ABNORMAL HIGH (ref 70–99)
Glucose-Capillary: 122 mg/dL — ABNORMAL HIGH (ref 70–99)
Glucose-Capillary: 133 mg/dL — ABNORMAL HIGH (ref 70–99)
Glucose-Capillary: 138 mg/dL — ABNORMAL HIGH (ref 70–99)
Glucose-Capillary: 94 mg/dL (ref 70–99)

## 2024-03-17 LAB — PHOSPHORUS: Phosphorus: 2.2 mg/dL — ABNORMAL LOW (ref 2.5–4.6)

## 2024-03-17 MED ORDER — SUCRALFATE 1 GM/10ML PO SUSP
1.0000 g | Freq: Three times a day (TID) | ORAL | Status: DC
  Administered 2024-03-17 – 2024-03-18 (×4): 1 g
  Filled 2024-03-17 (×4): qty 10

## 2024-03-17 MED ORDER — POTASSIUM & SODIUM PHOSPHATES 280-160-250 MG PO PACK
1.0000 | PACK | Freq: Three times a day (TID) | ORAL | Status: AC
  Administered 2024-03-17 – 2024-03-18 (×4): 1
  Filled 2024-03-17 (×4): qty 1

## 2024-03-17 MED ORDER — POTASSIUM & SODIUM PHOSPHATES 280-160-250 MG PO PACK
1.0000 | PACK | Freq: Three times a day (TID) | ORAL | Status: DC
  Filled 2024-03-17: qty 1

## 2024-03-17 MED ORDER — TORSEMIDE 20 MG PO TABS
20.0000 mg | ORAL_TABLET | Freq: Every day | ORAL | Status: DC
  Administered 2024-03-17: 20 mg
  Filled 2024-03-17: qty 1

## 2024-03-17 NOTE — Progress Notes (Signed)
 Progress Note   Patient: Billy [name] FMW:[id number] DOB: Sep 14, 1950 DOA: 01/21/2024     73 DOS: the patient was seen and examined on 03/17/2024   Brief hospital course: 73 year old male Prolonged admission 3/10-07/20/18/2025 cardiac arrest requiring ICU management cardioverted for A-fib,-found to have DVTs, developed hematochezia on anticoagulation necessitating IVC filter, with recurrent GI bleed secondary to erosive gastropathy and friable duodenal mucosa and colonoscopy was deferred and then chest tube placements cardiac cath at that time showed CAD with CTO of RCA and was treated with diuretics and sent home on torsemide  Entresto  Jardiance .   Readmitted 10/5 with nausea vomiting sudden tearing sensation found to have an esophageal tear was given Kcentra    Significant Events  10/7 esophageal stent placed by cardiothoracic 10/15 stent reposition 10/16 extubated 10/20 reintubated with ultrasound showing left-sided pleural effusion-left-sided chest tube placed-by lights criteria exudative effusion 10/23 echo 60-65% 10/24 barium swallow showed persistent leak 10/28 transferred out of ICU 11/3 PEG tube placed 11/19 swelling of right arm showing on ultrasound SVT cephalic vein 11/22 chest x-ray?  Pneumonia done because of back pain-follow-up CT scan shows stent in place no mediastinal fluid collection small bilateral pleural effusions with bibasilar atelectasis pneumonia not excluded-scattered clusters nodular densities predominantly right upper and right lower lobes with nodule 11 mm as previously seen?  Mets?  Infection?  Aspiration 11/25 PEG tube placement x-ray shows no extravasation 11/26 esophageal stent removal with no evidence of extravasation on esophagram in OR 11/27 torsemide  resumed for upper extremity edema 11/28 increased epigastric pain and N/V.  Severe duodenitis on repeat CT.  GI re-consulted.  Made NPO+ice, tube feeds stopped and G-tube placed to gravity drain 11/29 N/V  improved. Still epigastric pain but improved.  Re-trial clear liquids with G tube off drain   Assessment and Plan:  Boerhaave syndrome esophageal rupture-events as above-last esophagram 10/24 showed persistent leak Severe duodenitis -- on CT scan 11/28, obtained for increased epigastric pain and nausea/vomiting overnight. -- Continue IV Zosyn   -- Dr. Shyrl of [facility] following. Status post esophageal stent removal on 11/26 Case discussed with on-call surgeon Dr. Daniel 11/28 AM after pt had increased pain N/V --G-tube feeds resumed per surgery -- held 11/28>>11/29. See below. --Continue free water  100 Q6H --Continue IV PPI BID --Continue Carafate  11/28: --GI re-consulted for Duodenitis -- recommended carafate and PRN antiemetics. No EGD given recent course above --Trial draining G tube  11/29: --re-try on clear liquids and G-tube off draining --if tolerating clears by mouth, can trial on resuming tube feeds later today --Continue carafate per GI --if worsened pain or N/V with above, will go back to NPO + ice chips and G tube draining  11/30: resumed on tube feeds at 20 cc/hr and clear liquid diet per CTS --meds per tube for now until resumed on solid foods  --Continue Maalox 30 mL PRN --Nodules seen on recent chest CT are likely secondary to aspiration /effusion per CVTS --Oxy IR 5 q4 moderate, IV Dilaudid  0.5 q3 prn severe pain --Diet advancement per CTS   Upper back pain Musculoskeletal in nature imaging done as above 11/22 nonspecific-overall improved --Continue Flexeril  5 3 times daily as needed --Mobilize   Left arm swelling --Slightly improved with Lasix  --Now resumed on home torsemide  --Monitor & escalate diuresis as needed --Elevate arms in bed  Superficial venous thrombosis noted as above --Monitor --Aready on Eliquis    Hypoxic respiratory failure multifactorial with pneumonia pleural effusion CHF.  Improved with Lasix  and antibiotics. 11/29 O2 sats remain  stable  on room air --Continue Xopenex  inhalers Mucinex   --Resumed on home torsemide   Paroxysmal A-fib with DCCV 07/06/2023 11/28>>30: Heart rates controlled in the 60s to 70s -- Amiodarone  200 twice daily  --metoprolol  as needed IV for heart rate above 110 --de-escalate off of amiodarone  depending on next steps with surgery? --reduce amiodarone  from 200 mg BID >> daily & wean off as able.  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 --Continue Eliquis  5 twice daily    Uncontrolled blood pressure-- Hyperlipidemia 11/28--29 -- elevated BP's to systolic 180's-190's in the setting of severe abdominal pain and inability to keep down PO meds by tube with vomiting 11/30 -- BP's improved with pt tolerating meds by G-tube again --IV labetalol  and IV hydralazine  PRN --Titrate regimen --Continue: Hydralazine  100 mg Q8H atorvastatin  80  Zetia  10  Isordil  10 twice daily- metoprolol  6.25 BID >> increased to 12.5 mg BID  losartan  25 daily additionally-periodic labs Resumed home torsemide  11/27  --Monitor BP and renal function  Bipolar disorder -- overall stable --Appears not to be on medications.  Insomnia --Continue trazodone    Protein energy malnutrition BMI 21 --Supplements and vitamins --Dietitian following  CKD stage 3B -- stable  11/30 - Cr stable & improved 1.97 >> 1.51 >> 1.58 --Monitor  Metabolic acidosis - resolved --Monitor labs  Multiple bedsores and wounds anterior right and left foot and injury stage I to bottom --Continue Gerhardt twice daily  --Lidoderm  patch for back pain --Schedule Lomotil  1 tablet BID and QID PRN --Diarrhea overall has improved  History of Cardiac arrest 06/2023      Subjective: Pt seen awake sitting up in bed this AM.  He reports feeling somewhat better today than past couple days.  Tolerating clear liquids by mouth and tube feeds resumed at slower rate.  Denies nausea/vomiting or other  acute complaints.  Hopes to be able to advance to full liquids or solid foods soon.       Physical Exam: Vitals:   03/17/24 0013 03/17/24 0415 03/17/24 0759 03/17/24 1049  BP: 128/61  (!) 147/56 (!) 151/65  Pulse: 66   72  Resp: 20     Temp: 98.1 F (36.7 C) 98.1 F (36.7 C) 97.6 F (36.4 C)   TempSrc: Oral Oral Oral   SpO2: 96%     Weight:  82.2 kg    Height:        General exam: awake, alert, no acute distress HEENT: moist mucus membranes, hearing grossly normal  Respiratory system: on room air, normal respiratory effort Cardiovascular system: RRR, persistent b/l upper extremity edema.   Gastrointestinal system: soft, nondistended G-tube present with tube feeds running Central nervous system: A&O x3. no gross focal neurologic deficits, normal speech Skin: dry, intact, normal temperature Psychiatry: normal mood, congruent affect, judgement and insight appear normal   Data Reviewed:  Notable labs: Glucose 123 BUN 43 >> 30 >> 29 Creatinine 1.79 >> 1.97 >> 1.51 >> 1.58 stable & improved Calcium  7.9 Phos 2.4 >> 2.2  Hemoglobin 10.3 >> 10.5 >> 10.6 >> 9.4 WBC normalized 10.0   CT chest/abdomen/pelvis 11/28 AM --- new severe duodenitis: IMPRESSION: 1. Severe wall thickening involving the duodenum and proximal jejunum. These findings are new and concerning for severe enteritis. Etiology is uncertain. 2. Interval removal of the esophageal stent. Again noted is a pocket of gas along the left side of the mid/distal esophagus. No new mediastinal air. 3. Persistent small bilateral pleural effusions.  4. Patchy parenchymal densities in the right middle lobe and right lower lobe are concerning for areas of atelectasis and infection. 5. Multiple nodular densities in the right lung. These are new since 2019 and will need follow-up to exclude a neoplastic process. 6. Question mild wall thickening in the terminal ileum. 7. Atrophic left kidney.      Family Communication: None  present.  Patient updated in detail   Disposition: Status is: Inpatient Remains inpatient appropriate because: remains on IV therapies, pain uncontrolled.  D/c will be pending advancement of diet and clearance by cardiothoracic surgery.  Pending SNF placement once medically clear.   Planned Discharge Destination: Skilled nursing facility     Time spent: 45 minutes   Author: Burnard DELENA Cunning, DO 03/17/2024 11:02 AM  For on call review [email or web address].

## 2024-03-17 NOTE — Progress Notes (Signed)
      [address] [place] [address]             [phone]        Contacted last night in regards to resumption of tube feedings.  These were resumed at 49ml/hr and will need to be titrated to goal as patient tolerates.  Also has order to be on clear liquid diet.  No further diet advancement at this time.. Will discuss with [place] when he returns on Monday  Rocky Shad, PA-C 9:45 AM 03/17/24

## 2024-03-17 NOTE — Progress Notes (Signed)
 Attempted to consult with [place], MD regarding resuming pt's tube feeds; deferred to [facility]. Notified Erin Barrett, PA; advised to initiate tube feeds at a rate of 20 mL/hr.   Lonell LITTIE Lyme, RN

## 2024-03-17 NOTE — Plan of Care (Signed)
   Problem: Education: Goal: Knowledge of General Education information will improve Description: Including pain rating scale, medication(s)/side effects and non-pharmacologic comfort measures Outcome: Progressing   Problem: Health Behavior/Discharge Planning: Goal: Ability to manage health-related needs will improve Outcome: Progressing   Problem: Activity: Goal: Risk for activity intolerance will decrease Outcome: Progressing   Problem: Nutrition: Goal: Adequate nutrition will be maintained Outcome: Progressing   Problem: Safety: Goal: Ability to remain free from injury will improve Outcome: Progressing

## 2024-03-18 ENCOUNTER — Inpatient Hospital Stay (HOSPITAL_COMMUNITY)

## 2024-03-18 DIAGNOSIS — Z931 Gastrostomy status: Secondary | ICD-10-CM | POA: Diagnosis not present

## 2024-03-18 DIAGNOSIS — R109 Unspecified abdominal pain: Secondary | ICD-10-CM | POA: Diagnosis not present

## 2024-03-18 LAB — CBC
HCT: 29.7 % — ABNORMAL LOW (ref 39.0–52.0)
HCT: 33 % — ABNORMAL LOW (ref 39.0–52.0)
HCT: 37.3 % — ABNORMAL LOW (ref 39.0–52.0)
Hemoglobin: 9.2 g/dL — ABNORMAL LOW (ref 13.0–17.0)
Hemoglobin: 10.3 g/dL — ABNORMAL LOW (ref 13.0–17.0)
Hemoglobin: 11.8 g/dL — ABNORMAL LOW (ref 13.0–17.0)
MCH: 27.6 pg (ref 26.0–34.0)
MCH: 27.9 pg (ref 26.0–34.0)
MCH: 27.6 pg (ref 26.0–34.0)
MCHC: 31 g/dL (ref 30.0–36.0)
MCHC: 31.2 g/dL (ref 30.0–36.0)
MCHC: 31.6 g/dL (ref 30.0–36.0)
MCV: 89.2 fL (ref 80.0–100.0)
MCV: 89.4 fL (ref 80.0–100.0)
MCV: 87.1 fL (ref 80.0–100.0)
Platelets: 170 K/uL (ref 150–400)
Platelets: 194 K/uL (ref 150–400)
Platelets: 214 K/uL (ref 150–400)
RBC: 3.33 MIL/uL — ABNORMAL LOW (ref 4.22–5.81)
RBC: 3.69 MIL/uL — ABNORMAL LOW (ref 4.22–5.81)
RBC: 4.28 MIL/uL (ref 4.22–5.81)
RDW: 16.7 % — ABNORMAL HIGH (ref 11.5–15.5)
RDW: 16.7 % — ABNORMAL HIGH (ref 11.5–15.5)
RDW: 16.6 % — ABNORMAL HIGH (ref 11.5–15.5)
WBC: 8.2 K/uL (ref 4.0–10.5)
WBC: 11.5 K/uL — ABNORMAL HIGH (ref 4.0–10.5)
WBC: 13.8 K/uL — ABNORMAL HIGH (ref 4.0–10.5)
nRBC: 0 % (ref 0.0–0.2)
nRBC: 0 % (ref 0.0–0.2)
nRBC: 0.1 % (ref 0.0–0.2)

## 2024-03-18 LAB — GLUCOSE, CAPILLARY
Glucose-Capillary: 116 mg/dL — ABNORMAL HIGH (ref 70–99)
Glucose-Capillary: 127 mg/dL — ABNORMAL HIGH (ref 70–99)
Glucose-Capillary: 130 mg/dL — ABNORMAL HIGH (ref 70–99)
Glucose-Capillary: 130 mg/dL — ABNORMAL HIGH (ref 70–99)
Glucose-Capillary: 140 mg/dL — ABNORMAL HIGH (ref 70–99)
Glucose-Capillary: 146 mg/dL — ABNORMAL HIGH (ref 70–99)
Glucose-Capillary: 147 mg/dL — ABNORMAL HIGH (ref 70–99)

## 2024-03-18 LAB — TYPE AND SCREEN
ABO/RH(D): A POS
Antibody Screen: NEGATIVE

## 2024-03-18 LAB — BASIC METABOLIC PANEL WITH GFR
Anion gap: 7 (ref 5–15)
BUN: 32 mg/dL — ABNORMAL HIGH (ref 8–23)
CO2: 25 mmol/L (ref 22–32)
Calcium: 7.6 mg/dL — ABNORMAL LOW (ref 8.9–10.3)
Chloride: 104 mmol/L (ref 98–111)
Creatinine, Ser: 1.73 mg/dL — ABNORMAL HIGH (ref 0.61–1.24)
GFR, Estimated: 42 mL/min — ABNORMAL LOW (ref >60)
Glucose, Bld: 132 mg/dL — ABNORMAL HIGH (ref 70–99)
Potassium: 3.5 mmol/L (ref 3.5–5.1)
Sodium: 136 mmol/L (ref 135–145)

## 2024-03-18 LAB — PHOSPHORUS: Phosphorus: 2.8 mg/dL (ref 2.5–4.6)

## 2024-03-18 LAB — OCCULT BLOOD X 1 CARD TO LAB, STOOL: Fecal Occult Bld: POSITIVE — AB

## 2024-03-18 MED ORDER — HYDROMORPHONE HCL 1 MG/ML IJ SOLN
0.5000 mg | INTRAMUSCULAR | Status: DC | PRN
  Administered 2024-03-18 – 2024-03-24 (×51): 0.5 mg via INTRAVENOUS
  Filled 2024-03-18 (×52): qty 0.5

## 2024-03-18 MED ORDER — IOHEXOL 350 MG/ML SOLN
75.0000 mL | Freq: Once | INTRAVENOUS | Status: AC | PRN
  Administered 2024-03-18: 75 mL via INTRAVENOUS

## 2024-03-18 MED ORDER — DEXTROSE IN LACTATED RINGERS 5 % IV SOLN: INTRAVENOUS | Status: DC | PRN

## 2024-03-18 MED ORDER — LABETALOL HCL 5 MG/ML IV SOLN
5.0000 mg | Freq: Once | INTRAVENOUS | Status: AC
  Administered 2024-03-18: 5 mg via INTRAVENOUS
  Filled 2024-03-18: qty 4

## 2024-03-18 MED ORDER — IOHEXOL 300 MG/ML  SOLN
100.0000 mL | Freq: Once | INTRAMUSCULAR | Status: AC | PRN
  Administered 2024-03-18: 200 mL via ORAL

## 2024-03-18 NOTE — Progress Notes (Addendum)
 Patient asked for PRN oxycodone . PRN called for tube administration. RN unlocked port for tube feed - TF from tube began pouring from line. RN capped off stockcock to TF port. Attempted to open port for medication. Yellowish clear content draining from port. Port re-capped. ONEIDA Blackwater, MD made aware. MD  also made aware of ecchymosis, petechiae around PICC line dressing see pic in chart review located under media tab.

## 2024-03-18 NOTE — Progress Notes (Signed)
 Subjective: Complains of severe pain around feeding tube site which started at 4:30 AM today morning.  Objective: Vital signs in last 24 hours: Temp:  [98 F (36.7 C)-98.4 F (36.9 C)] 98 F (36.7 C) (12/01 1143) Pulse Rate:  [61-73] 73 (12/01 1143) Resp:  [18-20] 20 (12/01 1143) BP: (132-198)/(51-90) 198/90 (12/01 1143) SpO2:  [95 %-96 %] 95 % (12/01 0725) Weight change:  Last BM Date : 03/18/24  PE: In mild distress from abdominal pain GENERAL: Mild pallor ABDOMEN: Feeding tube in place, mild tenderness around feeding tube area, nondistended, bowel sounds normal active EXTREMITIES: No deformity  Lab Results: Results for orders placed or performed during the hospital encounter of 01/21/24 (from the past 48 hours)  Glucose, capillary     Status: None   Collection Time: 03/16/24  4:17 PM  Result Value Ref Range   Glucose-Capillary 89 70 - 99 mg/dL    Comment: Glucose reference range applies only to samples taken after fasting for at least 8 hours.  Glucose, capillary     Status: Abnormal   Collection Time: 03/16/24  8:24 PM  Result Value Ref Range   Glucose-Capillary 116 (H) 70 - 99 mg/dL    Comment: Glucose reference range applies only to samples taken after fasting for at least 8 hours.  Glucose, capillary     Status: Abnormal   Collection Time: 03/17/24 12:16 AM  Result Value Ref Range   Glucose-Capillary 116 (H) 70 - 99 mg/dL    Comment: Glucose reference range applies only to samples taken after fasting for at least 8 hours.  Glucose, capillary     Status: Abnormal   Collection Time: 03/17/24  4:18 AM  Result Value Ref Range   Glucose-Capillary 138 (H) 70 - 99 mg/dL    Comment: Glucose reference range applies only to samples taken after fasting for at least 8 hours.  CBC     Status: Abnormal   Collection Time: 03/17/24  5:30 AM  Result Value Ref Range   WBC 10.0 4.0 - 10.5 K/uL   RBC 3.35 (L) 4.22 - 5.81 MIL/uL   Hemoglobin 9.4 (L) 13.0 - 17.0 g/dL   HCT 70.0 (L)  60.9 - 52.0 %   MCV 89.3 80.0 - 100.0 fL   MCH 28.1 26.0 - 34.0 pg   MCHC 31.4 30.0 - 36.0 g/dL   RDW 82.8 (H) 88.4 - 84.4 %   Platelets 197 150 - 400 K/uL   nRBC 0.0 0.0 - 0.2 %    Comment: Performed at [facility], [address] [place], [place] [address]  Basic metabolic panel with GFR     Status: Abnormal   Collection Time: 03/17/24  5:30 AM  Result Value Ref Range   Sodium 137 135 - 145 mmol/L   Potassium 3.8 3.5 - 5.1 mmol/L   Chloride 105 98 - 111 mmol/L   CO2 23 22 - 32 mmol/L   Glucose, Bld 123 (H) 70 - 99 mg/dL    Comment: Glucose reference range applies only to samples taken after fasting for at least 8 hours.   BUN 29 (H) 8 - 23 mg/dL   Creatinine, Ser 8.41 (H) 0.61 - 1.24 mg/dL   Calcium  7.9 (L) 8.9 - 10.3 mg/dL   GFR, Estimated 46 (L) >60 mL/min    Comment: (NOTE) Calculated using the CKD-EPI Creatinine Equation (2021)    Anion gap 9 5 - 15    Comment: Performed at [facility], [address], [place], [place] [address]  Phosphorus     Status: Abnormal   Collection Time: 03/17/24  5:30 AM  Result Value Ref Range   Phosphorus 2.2 (L) 2.5 - 4.6 mg/dL    Comment: Performed at [facility], [address], [place], [place] [address]  Glucose, capillary     Status: Abnormal   Collection Time: 03/17/24  7:41 AM  Result Value Ref Range   Glucose-Capillary 133 (H) 70 - 99 mg/dL    Comment: Glucose reference range applies only to samples taken after fasting for at least 8 hours.  Glucose, capillary     Status: Abnormal   Collection Time: 03/17/24 11:39 AM  Result Value Ref Range   Glucose-Capillary 121 (H) 70 - 99 mg/dL    Comment: Glucose reference range applies only to samples taken after fasting for at least 8 hours.  Glucose, capillary     Status: None   Collection Time: 03/17/24  3:44 PM  Result Value Ref Range   Glucose-Capillary 94 70 - 99 mg/dL    Comment: Glucose reference range applies only to samples taken after fasting for at least 8  hours.  Glucose, capillary     Status: Abnormal   Collection Time: 03/17/24  7:47 PM  Result Value Ref Range   Glucose-Capillary 122 (H) 70 - 99 mg/dL    Comment: Glucose reference range applies only to samples taken after fasting for at least 8 hours.  Glucose, capillary     Status: Abnormal   Collection Time: 03/17/24 11:58 PM  Result Value Ref Range   Glucose-Capillary 116 (H) 70 - 99 mg/dL    Comment: Glucose reference range applies only to samples taken after fasting for at least 8 hours.  Glucose, capillary     Status: Abnormal   Collection Time: 03/18/24  4:06 AM  Result Value Ref Range   Glucose-Capillary 130 (H) 70 - 99 mg/dL    Comment: Glucose reference range applies only to samples taken after fasting for at least 8 hours.  Basic metabolic panel with GFR     Status: Abnormal   Collection Time: 03/18/24  4:20 AM  Result Value Ref Range   Sodium 136 135 - 145 mmol/L   Potassium 3.5 3.5 - 5.1 mmol/L   Chloride 104 98 - 111 mmol/L   CO2 25 22 - 32 mmol/L   Glucose, Bld 132 (H) 70 - 99 mg/dL    Comment: Glucose reference range applies only to samples taken after fasting for at least 8 hours.   BUN 32 (H) 8 - 23 mg/dL   Creatinine, Ser 8.26 (H) 0.61 - 1.24 mg/dL   Calcium  7.6 (L) 8.9 - 10.3 mg/dL   GFR, Estimated 42 (L) >60 mL/min    Comment: (NOTE) Calculated using the CKD-EPI Creatinine Equation (2021)    Anion gap 7 5 - 15    Comment: Performed at [facility], [address], [place], [place] [address]  Phosphorus     Status: None   Collection Time: 03/18/24  4:20 AM  Result Value Ref Range   Phosphorus 2.8 2.5 - 4.6 mg/dL    Comment: Performed at [facility], [address], [place], [place] [address]  CBC     Status: Abnormal   Collection Time: 03/18/24  4:20 AM  Result Value Ref Range   WBC 8.2 4.0 - 10.5 K/uL   RBC 3.33 (L) 4.22 - 5.81 MIL/uL   Hemoglobin 9.2 (L) 13.0 -  17.0 g/dL   HCT 70.2 (L) 60.9 - 47.9 %   MCV 89.2 80.0 - 100.0 fL   MCH  27.6 26.0 - 34.0 pg   MCHC 31.0 30.0 - 36.0 g/dL   RDW 83.2 (H) 88.4 - 84.4 %   Platelets 170 150 - 400 K/uL   nRBC 0.0 0.0 - 0.2 %    Comment: Performed at [facility], [address], [place], [place] [address]  Glucose, capillary     Status: Abnormal   Collection Time: 03/18/24  7:23 AM  Result Value Ref Range   Glucose-Capillary 127 (H) 70 - 99 mg/dL    Comment: Glucose reference range applies only to samples taken after fasting for at least 8 hours.  CBC     Status: Abnormal   Collection Time: 03/18/24 11:34 AM  Result Value Ref Range   WBC 11.5 (H) 4.0 - 10.5 K/uL   RBC 3.69 (L) 4.22 - 5.81 MIL/uL   Hemoglobin 10.3 (L) 13.0 - 17.0 g/dL   HCT 66.9 (L) 60.9 - 47.9 %   MCV 89.4 80.0 - 100.0 fL   MCH 27.9 26.0 - 34.0 pg   MCHC 31.2 30.0 - 36.0 g/dL   RDW 83.2 (H) 88.4 - 84.4 %   Platelets 194 150 - 400 K/uL   nRBC 0.0 0.0 - 0.2 %    Comment: Performed at [facility], [address] [place], [place] [address]  Glucose, capillary     Status: Abnormal   Collection Time: 03/18/24 11:42 AM  Result Value Ref Range   Glucose-Capillary 130 (H) 70 - 99 mg/dL    Comment: Glucose reference range applies only to samples taken after fasting for at least 8 hours.  Type and screen [facility]     Status: None   Collection Time: 03/18/24 12:38 PM  Result Value Ref Range   ABO/RH(D) A POS    Antibody Screen NEG    Sample Expiration      03/21/2024,2359 Performed at [facility], [address] [place], [place] [address]   Glucose, capillary     Status: Abnormal   Collection Time: 03/18/24  1:38 PM  Result Value Ref Range   Glucose-Capillary 147 (H) 70 - 99 mg/dL    Comment: Glucose reference range applies only to samples taken after fasting for at least 8 hours.    Studies/Results: DG Abd 1 View Result Date: 03/18/2024 EXAM: 1 VIEW XRAY OF THE ABDOMEN 03/18/2024 07:43:00 AM COMPARISON: 03/15/2024 CLINICAL HISTORY: Abdominal pain FINDINGS: LINES,  TUBES AND DEVICES: Percutaneous gastrostomy tube in place. Unchanged IVC filter in place. BOWEL: Nonobstructive bowel gas pattern. SOFT TISSUES: No opaque urinary calculi. BONES: No acute osseous abnormality. IMPRESSION: 1. No acute findings. 2. Percutaneous gastrostomy tube in expected position. 3. Unchanged inferior vena cava filter in place. Electronically signed by: Waddell Calk MD 03/18/2024 08:06 AM EST RP Workstation: [id number]    Medications: I have reviewed the patient's current medications.  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  I have recommended a CAT scan of the abdomen pelvis with oral contrast as CT from 03/15/2024 showed severe wall thickening involving duodenum, proximal jejunum, concern that this is worsened.  Billy Manas, MD 03/18/2024, 2:54 PM

## 2024-03-18 NOTE — Progress Notes (Addendum)
 [address], [place] [address]             [phone]  5 Days Post-Op Procedure(s) (LRB): ESOPHAGOGASTRODUODENOSCOPY WITH STENT REMOVAL (N/A) Subjective:  Called to see patient for ongoing abdominal discomfort that began early this morning. He had several episodes of nausea and mostly dry heaves that produced small amounts of mucus according to nurses description. This afternoon, he says the abdominal pain is not as severe but he just vomited ~267ml of thin, maroon emesis. He has also had a brown, liquid stool this afternoon.   Objective: Vital signs in last 24 hours: Temp:  [98 F (36.7 C)-98.4 F (36.9 C)] 98 F (36.7 C) (12/01 1143) Pulse Rate:  [61-73] 73 (12/01 1143) Cardiac Rhythm: Normal sinus rhythm (12/01 0700) Resp:  [18-20] 20 (12/01 1143) BP: (132-198)/(51-90) 198/90 (12/01 1143) SpO2:  [95 %-96 %] 95 % (12/01 0725)     Intake/Output from previous day: 11/30 0701 - 12/01 0700 In: 520 [P.O.:120; NG/GT:400] Out: 250 [Urine:250] Intake/Output this shift: No intake/output data recorded.  General appearance: alert, cooperative, and moderate distress Neurologic: intact Heart: SR with occasional PAC's Lungs: normal respiratory effort, breath sounds are clear. Abdomen: bowel sounds are hypoactive. Abd is soft, moderate epigastric tenderness without guarding.  Extremities: edema in both upper extremities (unchanged per RN).    Lab Results: Recent Labs    03/18/24 0420 03/18/24 1134  WBC 8.2 11.5*  HGB 9.2* 10.3*  HCT 29.7* 33.0*  PLT 170 194   BMET:  Recent Labs    03/17/24 0530 03/18/24 0420  NA 137 136  K 3.8 3.5  CL 105 104  CO2 23 25  GLUCOSE 123* 132*  BUN 29* 32*  CREATININE 1.58* 1.73*  CALCIUM  7.9* 7.6*    PT/INR: No results for input(s): LABPROT, INR in the last 72 hours. ABG    Component Value Date/Time   PHART 7.292 (L) 02/05/2024 0346   HCO3 25.7 02/05/2024 0346   TCO2 27 02/05/2024 0346    ACIDBASEDEF 1.0 02/05/2024 0346   O2SAT 64.1 02/10/2024 0748   CBG (last 3)  Recent Labs    03/18/24 0723 03/18/24 1142 03/18/24 1338  GLUCAP 127* 130* 147*    Assessment/Plan: S/P Procedure(s) (LRB): ESOPHAGOGASTRODUODENOSCOPY WITH STENT REMOVAL (N/A)  Billy [name] is 5 days post EGD with removal of an esophageal stent initial placed on 12/6 for esophageal perforation. Esophagram done on 11/26 showed no sign of residual perforation. A dysphagia diet was permited but he bloody emisis on 11/27 prompting CT abd/pelvis showing thickened small bowel concerning for enteritis.  He was seen by gastroenterology on 11/28 and medical mgt with Carafate and anti-emetics recommended. Tube feeding was resumed. He developed abd pain with nausea early this morning initially with mostly dry heaves and some mucus.  This afternoon, he had persistent pain and vomiting x 1 with maroon emesis.  The TF has been on hold since around 5am. Events and findings reviewed with Dr. Shyrl, plan to obtain an esophagram to r/o recurrent perforation. Recommend re-evaluation by GI.     LOS: 57 days    Billy JUDITHANN Becket, PA-C 03/18/2024   Unclear source of pain and hematemesis.  At stent removal, the PEG tube button was in good position.  He does have significant enteritis on CT, which could explain his abdominal pain.  This is non-surgical.  The esophageal stent was removed without complication, but there could be some residual irritation from the  removal.  A repeat esophagram will be ordered for further evaluation.  Billy [name]

## 2024-03-18 NOTE — Progress Notes (Addendum)
 Assessed RUA PICC site for potential problem. Patient's RUA PICC site, looked like a rash/petechiae inside the dressing and on the medial RUA looked like an old bruised. NO c/o of itching or discomfort at site. Patient stated he doesn't want another stick. Explained to patient that it might be a CHG/secure port reaction from not letting it dry and placing the dressing. Spoke with Cosette MD via phone and told him what I assessed. Tariq MD stated to let the staff nurses monitor it. [place] sent to [employer].

## 2024-03-18 NOTE — H&P (View-Only) (Signed)
 Subjective: Complains of severe pain around feeding tube site which started at 4:30 AM today morning.  Objective: Vital signs in last 24 hours: Temp:  [98 F (36.7 C)-98.4 F (36.9 C)] 98 F (36.7 C) (12/01 1143) Pulse Rate:  [61-73] 73 (12/01 1143) Resp:  [18-20] 20 (12/01 1143) BP: (132-198)/(51-90) 198/90 (12/01 1143) SpO2:  [95 %-96 %] 95 % (12/01 0725) Weight change:  Last BM Date : 03/18/24  PE: In mild distress from abdominal pain GENERAL: Mild pallor ABDOMEN: Feeding tube in place, mild tenderness around feeding tube area, nondistended, bowel sounds normal active EXTREMITIES: No deformity  Lab Results: Results for orders placed or performed during the hospital encounter of 01/21/24 (from the past 48 hours)  Glucose, capillary     Status: None   Collection Time: 03/16/24  4:17 PM  Result Value Ref Range   Glucose-Capillary 89 70 - 99 mg/dL    Comment: Glucose reference range applies only to samples taken after fasting for at least 8 hours.  Glucose, capillary     Status: Abnormal   Collection Time: 03/16/24  8:24 PM  Result Value Ref Range   Glucose-Capillary 116 (H) 70 - 99 mg/dL    Comment: Glucose reference range applies only to samples taken after fasting for at least 8 hours.  Glucose, capillary     Status: Abnormal   Collection Time: 03/17/24 12:16 AM  Result Value Ref Range   Glucose-Capillary 116 (H) 70 - 99 mg/dL    Comment: Glucose reference range applies only to samples taken after fasting for at least 8 hours.  Glucose, capillary     Status: Abnormal   Collection Time: 03/17/24  4:18 AM  Result Value Ref Range   Glucose-Capillary 138 (H) 70 - 99 mg/dL    Comment: Glucose reference range applies only to samples taken after fasting for at least 8 hours.  CBC     Status: Abnormal   Collection Time: 03/17/24  5:30 AM  Result Value Ref Range   WBC 10.0 4.0 - 10.5 K/uL   RBC 3.35 (L) 4.22 - 5.81 MIL/uL   Hemoglobin 9.4 (L) 13.0 - 17.0 g/dL   HCT 70.0 (L)  60.9 - 52.0 %   MCV 89.3 80.0 - 100.0 fL   MCH 28.1 26.0 - 34.0 pg   MCHC 31.4 30.0 - 36.0 g/dL   RDW 82.8 (H) 88.4 - 84.4 %   Platelets 197 150 - 400 K/uL   nRBC 0.0 0.0 - 0.2 %    Comment: Performed at [facility], [address], [place], [place] [address]  Basic metabolic panel with GFR     Status: Abnormal   Collection Time: 03/17/24  5:30 AM  Result Value Ref Range   Sodium 137 135 - 145 mmol/L   Potassium 3.8 3.5 - 5.1 mmol/L   Chloride 105 98 - 111 mmol/L   CO2 23 22 - 32 mmol/L   Glucose, Bld 123 (H) 70 - 99 mg/dL    Comment: Glucose reference range applies only to samples taken after fasting for at least 8 hours.   BUN 29 (H) 8 - 23 mg/dL   Creatinine, Ser 8.41 (H) 0.61 - 1.24 mg/dL   Calcium  7.9 (L) 8.9 - 10.3 mg/dL   GFR, Estimated 46 (L) >60 mL/min    Comment: (NOTE) Calculated using the CKD-EPI Creatinine Equation (2021)    Anion gap 9 5 - 15    Comment: Performed at [facility], [address], [place], [place] [address]  Phosphorus     Status: Abnormal   Collection Time: 03/17/24  5:30 AM  Result Value Ref Range   Phosphorus 2.2 (L) 2.5 - 4.6 mg/dL    Comment: Performed at [facility], [address], [place], [place] [address]  Glucose, capillary     Status: Abnormal   Collection Time: 03/17/24  7:41 AM  Result Value Ref Range   Glucose-Capillary 133 (H) 70 - 99 mg/dL    Comment: Glucose reference range applies only to samples taken after fasting for at least 8 hours.  Glucose, capillary     Status: Abnormal   Collection Time: 03/17/24 11:39 AM  Result Value Ref Range   Glucose-Capillary 121 (H) 70 - 99 mg/dL    Comment: Glucose reference range applies only to samples taken after fasting for at least 8 hours.  Glucose, capillary     Status: None   Collection Time: 03/17/24  3:44 PM  Result Value Ref Range   Glucose-Capillary 94 70 - 99 mg/dL    Comment: Glucose reference range applies only to samples taken after fasting for at least 8  hours.  Glucose, capillary     Status: Abnormal   Collection Time: 03/17/24  7:47 PM  Result Value Ref Range   Glucose-Capillary 122 (H) 70 - 99 mg/dL    Comment: Glucose reference range applies only to samples taken after fasting for at least 8 hours.  Glucose, capillary     Status: Abnormal   Collection Time: 03/17/24 11:58 PM  Result Value Ref Range   Glucose-Capillary 116 (H) 70 - 99 mg/dL    Comment: Glucose reference range applies only to samples taken after fasting for at least 8 hours.  Glucose, capillary     Status: Abnormal   Collection Time: 03/18/24  4:06 AM  Result Value Ref Range   Glucose-Capillary 130 (H) 70 - 99 mg/dL    Comment: Glucose reference range applies only to samples taken after fasting for at least 8 hours.  Basic metabolic panel with GFR     Status: Abnormal   Collection Time: 03/18/24  4:20 AM  Result Value Ref Range   Sodium 136 135 - 145 mmol/L   Potassium 3.5 3.5 - 5.1 mmol/L   Chloride 104 98 - 111 mmol/L   CO2 25 22 - 32 mmol/L   Glucose, Bld 132 (H) 70 - 99 mg/dL    Comment: Glucose reference range applies only to samples taken after fasting for at least 8 hours.   BUN 32 (H) 8 - 23 mg/dL   Creatinine, Ser 8.26 (H) 0.61 - 1.24 mg/dL   Calcium  7.6 (L) 8.9 - 10.3 mg/dL   GFR, Estimated 42 (L) >60 mL/min    Comment: (NOTE) Calculated using the CKD-EPI Creatinine Equation (2021)    Anion gap 7 5 - 15    Comment: Performed at [facility], [address], [place], [place] [address]  Phosphorus     Status: None   Collection Time: 03/18/24  4:20 AM  Result Value Ref Range   Phosphorus 2.8 2.5 - 4.6 mg/dL    Comment: Performed at [facility], [address], [place], [place] [address]  CBC     Status: Abnormal   Collection Time: 03/18/24  4:20 AM  Result Value Ref Range   WBC 8.2 4.0 - 10.5 K/uL   RBC 3.33 (L) 4.22 - 5.81 MIL/uL   Hemoglobin 9.2 (L) 13.0 -  17.0 g/dL   HCT 70.2 (L) 60.9 - 47.9 %   MCV 89.2 80.0 - 100.0 fL   MCH  27.6 26.0 - 34.0 pg   MCHC 31.0 30.0 - 36.0 g/dL   RDW 83.2 (H) 88.4 - 84.4 %   Platelets 170 150 - 400 K/uL   nRBC 0.0 0.0 - 0.2 %    Comment: Performed at [facility], [address], [place], [place] [address]  Glucose, capillary     Status: Abnormal   Collection Time: 03/18/24  7:23 AM  Result Value Ref Range   Glucose-Capillary 127 (H) 70 - 99 mg/dL    Comment: Glucose reference range applies only to samples taken after fasting for at least 8 hours.  CBC     Status: Abnormal   Collection Time: 03/18/24 11:34 AM  Result Value Ref Range   WBC 11.5 (H) 4.0 - 10.5 K/uL   RBC 3.69 (L) 4.22 - 5.81 MIL/uL   Hemoglobin 10.3 (L) 13.0 - 17.0 g/dL   HCT 66.9 (L) 60.9 - 47.9 %   MCV 89.4 80.0 - 100.0 fL   MCH 27.9 26.0 - 34.0 pg   MCHC 31.2 30.0 - 36.0 g/dL   RDW 83.2 (H) 88.4 - 84.4 %   Platelets 194 150 - 400 K/uL   nRBC 0.0 0.0 - 0.2 %    Comment: Performed at [facility], [address] [place], [place] [address]  Glucose, capillary     Status: Abnormal   Collection Time: 03/18/24 11:42 AM  Result Value Ref Range   Glucose-Capillary 130 (H) 70 - 99 mg/dL    Comment: Glucose reference range applies only to samples taken after fasting for at least 8 hours.  Type and screen [facility]     Status: None   Collection Time: 03/18/24 12:38 PM  Result Value Ref Range   ABO/RH(D) A POS    Antibody Screen NEG    Sample Expiration      03/21/2024,2359 Performed at [facility], [address] [place], [place] [address]   Glucose, capillary     Status: Abnormal   Collection Time: 03/18/24  1:38 PM  Result Value Ref Range   Glucose-Capillary 147 (H) 70 - 99 mg/dL    Comment: Glucose reference range applies only to samples taken after fasting for at least 8 hours.    Studies/Results: DG Abd 1 View Result Date: 03/18/2024 EXAM: 1 VIEW XRAY OF THE ABDOMEN 03/18/2024 07:43:00 AM COMPARISON: 03/15/2024 CLINICAL HISTORY: Abdominal pain FINDINGS: LINES,  TUBES AND DEVICES: Percutaneous gastrostomy tube in place. Unchanged IVC filter in place. BOWEL: Nonobstructive bowel gas pattern. SOFT TISSUES: No opaque urinary calculi. BONES: No acute osseous abnormality. IMPRESSION: 1. No acute findings. 2. Percutaneous gastrostomy tube in expected position. 3. Unchanged inferior vena cava filter in place. Electronically signed by: Waddell Calk MD 03/18/2024 08:06 AM EST RP Workstation: [id number]    Medications: I have reviewed the patient's current medications.  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  I have recommended a CAT scan of the abdomen pelvis with oral contrast as CT from 03/15/2024 showed severe wall thickening involving duodenum, proximal jejunum, concern that this is worsened.  Estelita Manas, MD 03/18/2024, 2:54 PM

## 2024-03-18 NOTE — Progress Notes (Signed)
 Pt stated that he felt more nauseated, pt also complained of intense gastric pain. Tube feeds paused & attempted to notify Franky, MD. Pt is refusing PRN antiemetic medications.  Lonell LITTIE Lyme, RN

## 2024-03-18 NOTE — Progress Notes (Signed)
 Occupational Therapy Treatment Patient Details Name: Billy [name] MRN: [id number] DOB: Aug 02, 1950 Today's Date: 03/18/2024   History of present illness Billy [name] is a 73 y.o. male who presented to [facility] ED 01/21/24 with several days of vomiting followed by an episode of severe tearing epigastric pain. Work-up revealed esophageal tear. Pt s/p esophageal stent with EGD 10/7. Return to OR for esophageal stent adjustment on 10/15, remained intubated after procedure due to suspected aspiration. Extubated 10/16.  Pt had CT placed 10/16. Reintubated 10/20-10/24. G tube placed 11/3. He accidentally pulled out the R chest tube on 11/7.L chest tube removed 11/11. Found to have superficial venous thrombosis of RUE 11/19. N&V with epigastirc pain 11/28,   found severe duodenitis on repeat CT. PMHx: HFrEF, AFib, CAD, PE complicated by cardiogenic shock, CKD 4, MI, peripheral neuropathy, and TIA.   OT comments  Pt not progressing toward goals, limited by n/v this session. Pt willing to attempt OOB mobility, able to stand x2 for pericare and long enough for bed sheet change, but then requesting to sit, dry heaving/vomiting while seated EOB. RN notified. Pt presenting with impairments listed below, will follow acutely. Patient will benefit from continued inpatient follow up therapy, <3 hours/day to maximize safety/ind with ADL/functional mobility.       If plan is discharge home, recommend the following:  Assistance with cooking/housework;Direct supervision/assist for medications management;Direct supervision/assist for financial management;Assist for transportation;Help with stairs or ramp for entrance;A lot of help with bathing/dressing/bathroom;A lot of help with walking and/or transfers   Equipment Recommendations  Other (comment) (defer)    Recommendations for Other Services Rehab consult    Precautions / Restrictions Precautions Precautions: Fall Recall of Precautions/Restrictions:  Impaired Precaution/Restrictions Comments: G-tube Restrictions Weight Bearing Restrictions Per Provider Order: No       Mobility Bed Mobility Overal bed mobility: Needs Assistance Bed Mobility: Supine to Sit Rolling: Contact guard assist              Transfers Overall transfer level: Needs assistance Equipment used: Rolling walker (2 wheels) Transfers: Sit to/from Stand Sit to Stand: Contact guard assist                 Balance Overall balance assessment: Needs assistance Sitting-balance support: Feet supported, No upper extremity supported Sitting balance-Leahy Scale: Good Sitting balance - Comments: EOB without support   Standing balance support: Reliant on assistive device for balance, During functional activity, Bilateral upper extremity supported Standing balance-Leahy Scale: Poor Standing balance comment: Dependent on RW                           ADL either performed or assessed with clinical judgement   ADL Overall ADL's : Needs assistance/impaired                         Toilet Transfer: Minimal assistance;Rolling walker (2 wheels) Toilet Transfer Details (indicate cue type and reason): simulated via functional mobility Toileting- Clothing Manipulation and Hygiene: Maximal assistance Toileting - Clothing Manipulation Details (indicate cue type and reason): standing pericare     Functional mobility during ADLs: Minimal assistance;Rolling walker (2 wheels)      Extremity/Trunk Assessment Upper Extremity Assessment Upper Extremity Assessment: Generalized weakness   Lower Extremity Assessment Lower Extremity Assessment: Defer to PT evaluation        Vision   Vision Assessment?: No apparent visual deficits   Perception Perception Perception: Not tested   Praxis Praxis  Praxis: Not tested   Communication Communication Communication: Impaired Factors Affecting Communication: Hearing impaired   Cognition Arousal:  Alert Behavior During Therapy: Flat affect     Orientation impairments: Time Awareness: Online awareness impaired, Intellectual awareness intact       OT - Cognition Comments: very anxious, needs encouragement to attempt BADL/mobility tasks without assist                 Following commands: Impaired Following commands impaired: Follows multi-step commands inconsistently      Cueing   Cueing Techniques: Verbal cues, Gestural cues  Exercises      Shoulder Instructions       General Comments VSS    Pertinent Vitals/ Pain       Pain Assessment Pain Assessment: Faces Pain Score: 8  Faces Pain Scale: Hurts whole lot Pain Location: abdomen/nausea Pain Descriptors / Indicators: Discomfort Pain Intervention(s): Limited activity within patient's tolerance, Monitored during session, Repositioned  Home Living                                          Prior Functioning/Environment              Frequency  Min 2X/week        Progress Toward Goals  OT Goals(current goals can now be found in the care plan section)  Progress towards OT goals: Not progressing toward goals - comment (limited by n/v)  Acute Rehab OT Goals OT Goal Formulation: With patient Time For Goal Achievement: 04/01/24 Potential to Achieve Goals: Fair ADL Goals Pt Will Perform Upper Body Dressing: with contact guard assist;sitting Pt Will Perform Lower Body Dressing: with contact guard assist;sitting/lateral leans;sit to/from stand;with adaptive equipment Pt Will Transfer to Toilet: with contact guard assist;ambulating;regular height toilet Pt/caregiver will Perform Home Exercise Program: Increased ROM;Increased strength;Both right and left upper extremity;With Supervision  Plan      Co-evaluation                 AM-PAC OT 6 Clicks Daily Activity     Outcome Measure   Help from another person eating meals?: A Little Help from another person taking care of  personal grooming?: A Little Help from another person toileting, which includes using toliet, bedpan, or urinal?: A Lot Help from another person bathing (including washing, rinsing, drying)?: A Lot Help from another person to put on and taking off regular upper body clothing?: A Little Help from another person to put on and taking off regular lower body clothing?: A Lot 6 Click Score: 15    End of Session Equipment Utilized During Treatment: Rolling walker (2 wheels)  OT Visit Diagnosis: Muscle weakness (generalized) (M62.81);Other symptoms and signs involving cognitive function;Unsteadiness on feet (R26.81)   Activity Tolerance Patient tolerated treatment well   Patient Left in bed;with bed alarm set;with call bell/phone within reach (seated EOB)   Nurse Communication Mobility status        Time: 9191-9171 OT Time Calculation (min): 20 min  Charges: OT General Charges $OT Visit: 1 Visit OT Treatments $Self Care/Home Management : 8-22 mins  Amere Iott K, OTD, OTR/L SecureChat Preferred Acute Rehab [phone]   [name] 03/18/2024, 9:24 AM

## 2024-03-18 NOTE — Progress Notes (Signed)
 RN called to room by patient. RN assessed patient G-Tube line and noticed dark maroon colored fluid with sediment in line. MD made aware.

## 2024-03-18 NOTE — Progress Notes (Addendum)
 PROGRESS NOTE    Billy [name]  FMW:[id number] DOB: February 01, 1951 DOA: 01/21/2024 PCP: Seabron Lenis, MD  Subjective:  No acute events overnight. Informed by nursing of patient developing nausea, small vomitus, severe epigastric pain and large residual from PEG tube that is starting to turn maroon color. Seen and examined at bedside. Reports having some nausea and abdominal pain. Denies any constipation.   Hospital Course: 73 year old with history of cardiac arrest earlier this year, congestive heart failure with reduced EF, A-fib, PE presenting with nausea vomiting found to have esophageal rupture/tear requiring stent placement 01/23/2024.  Swallow study revealed additional leak and had stent repositioning 01/31/2024. 10/15 stent repositioned. There was concerns of gastric content aspiration therefore mechanically ventilated in the ICU.  Bedside ultrasound revealed right-sided pleural effusion requiring chest tube placement with removal of 300 cc of exudative fluid. He was also successfully extubated on 10/16. Patient has had increased oxygen  requirement requiring reintubation on 10/20. 10/24 barium swallow showed persistent leak. Eventually, patient was extubated and transferred out of ICU on 10/28. 11/3 PEG tube placed. 11/19 swelling of right arm showing on ultrasound SVT cephalic vein.   Hospital course complicated by acute hypoxic respiratory, PNA, acute on chronic HFpEF for which he is on antibiotics and IV diuresis per advanced heart failure team. PEG tube placed on 11/3.  Dietitian consulted for initiation of tube feed. Remains on TPN until tube feed at goal.   11/25 PEG tube placement x-ray shows no extravasation. 11/27 torsemide  resumed for upper extremity edema. 11/28 increased epigastric pain and N/V.  Severe duodenitis on repeat CT.  GI re-consulted.  Made NPO+ice, tube feeds stopped and G-tube placed to gravity drain. 11/29 N/V improved. Still epigastric pain but improved.  Re-trial clear  liquids with G tube off drain.   Assessment and Plan:  Boerhaave syndrome  Esophageal rupture - events as above - last esophagram 10/24 showed persistent leak. -11/26 esophageal stent removal with no evidence of extravasation on esophagram in OR - Status post esophageal stent removal on 11/26 - continues to have severe epigastric pain with nausea and intermittent vomiting - discussed with CT surgery to ask for re-evaluation given recurrence of symptoms but they stated no concern for another leak and like related to enteritis/duodenitis that is to be managed by GI team - cont PPI IV BID - cont sucralfate - increase IV dilaudid  0.5mg  q3h to q2h PRN  Severe duodenitis  - seen on CT scan 11/28 that was obtained for increased epigastric pain and nausea/vomiting  - GI signed off on 11/29 with no plan for EGD and recommended management of diet and G tube to be done by CT surgery who placed/adjusted/removed esophageal stent and directed placement of PEG tube - hold tube feeds for now - switch clear liquid diet to NPO  - Continue free water  100 Q6H - IV fluids while unable to tolerate tube feeds and take PO - Continue IV Zosyn , duration TBD  - cont PPI IV BID - cont sucralfate - cont maalox PRN - cont phenergan  IV PRN - may need to trial draining G tube periodically  - will touch base with dietician for TPN recommendations again - discussed with CT surgery to ask for re-evaluation given recurrence of symptoms but they stated no concern for another leak and like related to enteritis/duodenitis that is to be managed by GI team - I have reached out to [facility] GI team by page stressing urgency of re-consult, waiting to hear back  Anemia Coffee Ground output from  PEG - etiology unclear, maybe related to esophageal rupture vs ongoing severe duodenitis - Hgb 9.2 < 9.4, baseline 8-12 - hold eliquis  again - cont PPI IV BID and sucralfate - repeat type and screen - transfuse for Hgb goal > 7 -  monitor CBC  Lung nodules  - seen on recent chest CT - likely secondary to aspiration /effusion per CT Surgery -- pain control as elsewhere - monitor clinically    Upper back pain Musculoskeletal in nature imaging done as above 11/22 nonspecific-overall improved --Continue Flexeril  5 3 times daily as needed --Mobilize    Left arm swelling --Slightly improved with Lasix  --hold home torsemide  given concerns of dehydration with ongoing PEG output --Elevate arms in bed   Superficial venous thrombosis  - noted as above --Monitor --Hold Eliquis  given concern for acute bleed - monitor CBC    RUE ecchymosis and petechiae Noted about PICC site, originally placed 02/05/24 No warmth, tenderness, discharge, induration, fluctuance Discussed with IV team. Might be a CHG/secure port reaction from not letting it dry and placing the dressing.  Low concern for acute infection Cont local skin care as per IV team recs Monitor clinically  Acute hypoxic respiratory failure  Pneumonia Pleural effusion Acute CHF exacerbation Respiratory failure multifactorial in the setting of pneumonia, pleural effusion, CHF exacerbation.  - Improved with Lasix  and antibiotics. - Continue Xopenex  inhalers  - Mucinex   --hold home torsemide  given concerns of dehydration with ongoing PEG output   Paroxysmal A-fib  - DCCV 07/06/2023 - Amiodarone  200mg  daily.  Plan to taper off as able as converted to NSR and depending on next steps with surgery? --metoprolol  as needed IV for heart rate above 110   History of PE with DVTs  - with IVC filter placed 3/25 - hold Eliquis  5 given coffee ground output from PEG - monitor CBC   Hypertension Hyperlipidemia - maybe related to intractable pain with nausea and vomiting - cont IV labetalol  and IV hydralazine  PRN - Hydralazine  100 mg Q8H - atorvastatin  80  - Zetia  10  - Isordil  10 twice daily - metoprolol  12.5 mg BID  - losartan  25 daily  - hold home torsemide   given concerns of dehydration with significant PEG output --Monitor BP and renal function   Bipolar disorder  - overall stable - Appears not to be on medications.   Insomnia --Continue trazodone     Protein energy malnutrition BMI 21 --Supplements and vitamins - tube feeds on hold again - may need to resume TPN again - will touch base with Dietitian for recommendations    CKD stage 3B -- stable  11/30 - Cr stable & improved 1.97 >> 1.51 >> 1.58 --Monitor   Metabolic acidosis - resolved --Monitor labs   Multiple bedsores and wounds anterior right and left foot and injury stage I to bottom --Continue Gerhardt twice daily  --Lidoderm  patch for back pain --Schedule Lomotil  1 tablet BID and QID PRN --Diarrhea overall has improved   History of Cardiac arrest 06/2023  DVT prophylaxis: Place TED hose Start: 02/27/24 1903 SCDs Start: 01/21/24 2023  SCDs   Code Status: Full Code Disposition Plan: SNF Reason for continuing need for hospitalization: severity of illness  Objective: Vitals:   03/17/24 1618 03/17/24 1948 03/17/24 2358 03/18/24 0725  BP: (!) 149/63 (!) 144/59 (!) 132/51 (!) 173/71  Pulse:  70 67 61  Resp:  18 18 19   Temp:  98.2 F (36.8 C)  98 F (36.7 C)  TempSrc:  Oral Oral Oral  SpO2:  96% 96% 95%  Weight:      Height:        Intake/Output Summary (Last 24 hours) at 03/18/2024 1130 Last data filed at 03/18/2024 0601 Gross per 24 hour  Intake 400 ml  Output --  Net 400 ml   Filed Weights   03/15/24 0345 03/16/24 0456 03/17/24 0415  Weight: 78.8 kg 76.6 kg 82.2 kg    Examination:  Physical Exam Vitals and nursing note reviewed.  Constitutional:      General: He is in acute distress.     Appearance: He is ill-appearing.     Comments: Weak, frail  HENT:     Head: Normocephalic and atraumatic.  Cardiovascular:     Rate and Rhythm: Normal rate and regular rhythm.     Pulses: Normal pulses.     Heart sounds: Normal heart sounds.  Pulmonary:      Effort: Pulmonary effort is normal.     Breath sounds: Normal breath sounds.  Abdominal:     General: Bowel sounds are normal. There is no distension.     Palpations: Abdomen is soft.     Tenderness: There is no abdominal tenderness. There is no guarding or rebound.     Hernia: No hernia is present.  Neurological:     Mental Status: He is alert. Mental status is at baseline.     Data Reviewed: I have personally reviewed following labs and imaging studies  CBC: Recent Labs  Lab 03/12/24 0500 03/13/24 0520 03/14/24 0435 03/15/24 0354 03/16/24 0830 03/17/24 0530 03/18/24 0420  WBC 10.0 11.2* 10.4 10.4 12.5* 10.0 8.2  NEUTROABS 8.6* 9.8* 9.3* 8.8*  --   --   --   HGB 10.3* 10.3* 9.8* 10.5* 10.6* 9.4* 9.2*  HCT 32.9* 33.0* 31.7* 33.7* 34.2* 29.9* 29.7*  MCV 89.6 89.9 90.1 89.4 89.1 89.3 89.2  PLT 215 206 220 215 204 197 170   Basic Metabolic Panel: Recent Labs  Lab 03/14/24 0435 03/15/24 0354 03/16/24 0830 03/17/24 0530 03/18/24 0420  NA 135 137 139 137 136  K 4.4 3.9 4.0 3.8 3.5  CL 105 101 106 105 104  CO2 21* 22 23 23 25   GLUCOSE 153* 142* 107* 123* 132*  BUN 41* 43* 30* 29* 32*  CREATININE 1.88* 1.97* 1.51* 1.58* 1.73*  CALCIUM  7.9* 7.8* 8.1* 7.9* 7.6*  MG  --   --  2.0  --   --   PHOS  --   --  2.4* 2.2* 2.8   GFR: Estimated Creatinine Clearance: 44.3 mL/min (A) (by C-G formula based on SCr of 1.73 mg/dL (H)). Liver Function Tests: Recent Labs  Lab 03/12/24 0500 03/13/24 0520 03/14/24 0435  AST 39 41 38  ALT 62* 66* 75*  ALKPHOS 70 65 67  BILITOT 0.5 0.8 0.2  PROT 5.2* 5.4* 4.8*  ALBUMIN  1.7* 1.8* 1.6*   No results for input(s): LIPASE, AMYLASE in the last 168 hours. No results for input(s): AMMONIA in the last 168 hours. Coagulation Profile: No results for input(s): INR, PROTIME in the last 168 hours. Cardiac Enzymes: No results for input(s): CKTOTAL, CKMB, CKMBINDEX, TROPONINI in the last 168 hours. ProBNP, BNP (last 5  results) Recent Labs    06/27/23 1116  BNP 646.4*   HbA1C: No results for input(s): HGBA1C in the last 72 hours. CBG: Recent Labs  Lab 03/17/24 1544 03/17/24 1947 03/17/24 2358 03/18/24 0406 03/18/24 0723  GLUCAP 94 122* 116* 130* 127*   Lipid Profile: No results  for input(s): CHOL, HDL, LDLCALC, TRIG, CHOLHDL, LDLDIRECT in the last 72 hours. Thyroid  Function Tests: No results for input(s): TSH, T4TOTAL, FREET4, T3FREE, THYROIDAB in the last 72 hours. Anemia Panel: No results for input(s): VITAMINB12, FOLATE, FERRITIN, TIBC, IRON , RETICCTPCT in the last 72 hours. Sepsis Labs: No results for input(s): PROCALCITON, LATICACIDVEN in the last 168 hours.  Recent Results (from the past 240 hours)  Culture, blood (Routine X 2) w Reflex to ID Panel     Status: None   Collection Time: 03/09/24  9:04 PM   Specimen: BLOOD  Result Value Ref Range Status   Specimen Description BLOOD SITE NOT SPECIFIED  Final   Special Requests   Final    BOTTLES DRAWN AEROBIC AND ANAEROBIC Blood Culture adequate volume   Culture   Final    NO GROWTH 5 DAYS Performed at [facility], [address], [place], [place] [address]    Report Status 03/14/2024 FINAL  Final  Culture, blood (Routine X 2) w Reflex to ID Panel     Status: None   Collection Time: 03/09/24  9:04 PM   Specimen: BLOOD  Result Value Ref Range Status   Specimen Description BLOOD SITE NOT SPECIFIED  Final   Special Requests   Final    BOTTLES DRAWN AEROBIC AND ANAEROBIC Blood Culture adequate volume   Culture   Final    NO GROWTH 5 DAYS Performed at [facility], [address], [place], [place] [address]    Report Status 03/14/2024 FINAL  Final  Surgical pcr screen     Status: Abnormal   Collection Time: 03/12/24  4:05 PM   Specimen: Nasal Mucosa; Nasal Swab  Result Value Ref Range Status   MRSA, PCR POSITIVE (A) NEGATIVE Final    Comment: RESULT CALLED TO, READ BACK BY AND  VERIFIED WITH: RN T. LILIAN [phone] @2023  FH    Staphylococcus aureus POSITIVE (A) NEGATIVE Final    Comment: (NOTE) The Xpert SA Assay (FDA approved for NASAL specimens in patients 72 years of age and older), is one component of a comprehensive surveillance program. It is not intended to diagnose infection nor to guide or monitor treatment. Performed at [facility], [address], [place], [place] [address]      Radiology Studies: DG Abd 1 View Result Date: 03/18/2024 EXAM: 1 VIEW XRAY OF THE ABDOMEN 03/18/2024 07:43:00 AM COMPARISON: 03/15/2024 CLINICAL HISTORY: Abdominal pain FINDINGS: LINES, TUBES AND DEVICES: Percutaneous gastrostomy tube in place. Unchanged IVC filter in place. BOWEL: Nonobstructive bowel gas pattern. SOFT TISSUES: No opaque urinary calculi. BONES: No acute osseous abnormality. IMPRESSION: 1. No acute findings. 2. Percutaneous gastrostomy tube in expected position. 3. Unchanged inferior vena cava filter in place. Electronically signed by: Waddell Calk MD 03/18/2024 08:06 AM EST RP Workstation: [id number]    Scheduled Meds:  amiodarone   200 mg Per Tube Daily   apixaban   5 mg Per Tube BID   atorvastatin   80 mg Per Tube Daily   Chlorhexidine  Gluconate Cloth  6 each Topical Daily   diphenoxylate -atropine   1 tablet Per Tube BID   ezetimibe   10 mg Per Tube Daily   feeding supplement (PROSource TF20)  60 mL Per Tube Daily   fiber supplement (BANATROL TF)  60 mL Per Tube QID   free water   100 mL Per Tube Q6H   [name] butt cream   Topical BID   hydrALAZINE   100 mg Per Tube Q8H   insulin  aspart  0-9 Units Subcutaneous Q4H   isosorbide  dinitrate  10 mg Per Tube BID   lidocaine   1 patch Transdermal Q24H   losartan   25 mg Per Tube Daily   metoprolol  tartrate  12.5 mg Per Tube BID   pantoprazole  (PROTONIX ) IV  40 mg Intravenous Q12H   saccharomyces boulardii  250 mg Per Tube BID   sodium chloride  flush  10-40 mL Intracatheter Q12H   sucralfate  1 g Per  Tube TID WC & HS   torsemide   20 mg Per Tube Daily   Continuous Infusions:  dextrose  5% lactated ringers      feeding supplement (KATE FARMS STANDARD ENT 1.4) Stopped (03/18/24 9357)   piperacillin -tazobactam (ZOSYN )  IV 3.375 g (03/18/24 0552)   promethazine  (PHENERGAN ) injection (IM or IVPB) 12.5 mg (03/18/24 0850)     LOS: 57 days   Norval Bar, MD  [facility]  03/18/2024, 11:30 AM

## 2024-03-18 NOTE — Progress Notes (Signed)
      [address] [employer] [address]             [phone]         5 Days Post-Op Procedure(s) (LRB): ESOPHAGOGASTRODUODENOSCOPY WITH STENT REMOVAL (N/A)  Subjective:  Patient with epigastric pain, some N/V this morning.  Objective: Vital signs in last 24 hours: Temp:  [98 F (36.7 C)-98.4 F (36.9 C)] 98 F (36.7 C) (12/01 0725) Pulse Rate:  [61-72] 61 (12/01 0725) Cardiac Rhythm: Normal sinus rhythm (12/01 0700) Resp:  [18-19] 19 (12/01 0725) BP: (132-173)/(51-71) 173/71 (12/01 0725) SpO2:  [95 %-96 %] 95 % (12/01 0725)  Intake/Output from previous day: 11/30 0701 - 12/01 0700 In: 520 [P.O.:120; NG/GT:400] Out: 250 [Urine:250]  General appearance: alert, cooperative, and no distress Heart: regular rate and rhythm Lungs: clear to auscultation bilaterally Abdomen: soft, mild tenderness  Lab Results: Recent Labs    03/17/24 0530 03/18/24 0420  WBC 10.0 8.2  HGB 9.4* 9.2*  HCT 29.9* 29.7*  PLT 197 170   BMET:  Recent Labs    03/17/24 0530 03/18/24 0420  NA 137 136  K 3.8 3.5  CL 105 104  CO2 23 25  GLUCOSE 123* 132*  BUN 29* 32*  CREATININE 1.58* 1.73*  CALCIUM  7.9* 7.6*    PT/INR: No results for input(s): LABPROT, INR in the last 72 hours. ABG    Component Value Date/Time   PHART 7.292 (L) 02/05/2024 0346   HCO3 25.7 02/05/2024 0346   TCO2 27 02/05/2024 0346   ACIDBASEDEF 1.0 02/05/2024 0346   O2SAT 64.1 02/10/2024 0748   CBG (last 3)  Recent Labs    03/17/24 2358 03/18/24 0406 03/18/24 0723  GLUCAP 116* 130* 127*    Assessment/Plan: S/P Procedure(s) (LRB): ESOPHAGOGASTRODUODENOSCOPY WITH STENT REMOVAL (N/A)  S/P Esophageal stent remove due to previous perforation  2. Enteritis- management per medicine/GI  3. GI- okay to continue oral intake staring with clears and diet advancement as tolerated... tube feedings can also be continued as needed, with plan to remove PEG tube in our office once patient  is discharged   Management per Medicine service... we will follow peripherally.. no further indication for Cardiothoracic surgery at this time   LOS: 57 days    Rocky Shad, PA-C 03/18/2024 8:48 AM

## 2024-03-18 NOTE — Plan of Care (Signed)
  Problem: Education: Goal: Knowledge of General Education information will improve Description: Including pain rating scale, medication(s)/side effects and non-pharmacologic comfort measures Outcome: Progressing   Problem: Health Behavior/Discharge Planning: Goal: Ability to manage health-related needs will improve Outcome: Not Progressing   Problem: Clinical Measurements: Goal: Respiratory complications will improve Outcome: Progressing Goal: Cardiovascular complication will be avoided Outcome: Progressing   Problem: Coping: Goal: Ability to adjust to condition or change in health will improve Outcome: Not Progressing   Problem: Tissue Perfusion: Goal: Adequacy of tissue perfusion will improve Outcome: Progressing

## 2024-03-18 NOTE — Progress Notes (Signed)
 PHARMACY - TOTAL PARENTERAL NUTRITION CONSULT NOTE   Indication: intolerance to enteral feedings, esophageal perforation  Patient Measurements: Height: 6' 1 (185.4 cm) Weight: 82.2 kg (181 lb 3.5 oz) IBW/kg (Calculated) : 79.9 TPN AdjBW (KG): 78.8 Body mass index is 23.91 kg/m.  Assessment:  73 year old man with medical history significant for cardiac arrest earlier this year who was admitted after several days of nausea and vomiting followed by sudden onset severe tearing epigastric pain radiating to back and chest. 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 Pharmacy consulted to initiate TPN due to inability to tolerate enteral nutrition at this time.  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 PTA takes torsemide  20 mg PO daily, now held. Aggressive fluid resuscitation recommended per GI. Per HF, patient currently euvolemic but will hold diuretic for now. Due to CHF will target lower end of fluid range for TPN and supplement additional fluids per MD outside of TPN to prevent fluid overload.   Stent placed on 10/6, esophogram not showing any leak. Diet advanced to CLD. 10/15 Leak present and made NPO. 10/24 esophagram with persistent leak; will need stent replacement in 3-4 weeks.   12/1 re-consulted for TPN due to maroon output from PEG, N/V and concern for enteritis/duodenitis. TF held. Repeat esophogram pending.   Glucose / Insulin : A1C 6.0% (02/2023);  BG< 150, used 4 units SSI/24hr, did not need insulin  when on TPN  Electrolytes:  wnl  Renal: SCr 1.73 (BL ~2), BUN 32 Hepatic:  last alk phos/AST/ALT/tbili wnl, alb 1.6, TG wnl 11/10 Intake / Output; MIVF: sucralfate; UOP charted, emesis 12/1, LBM 12/1    GI Imaging: 10/5 CT: concerning for esophageal perforation, masslike area of soft tissue thickening adjacent to and surrounding the esophagus  10/6 Esoph XR: distal esophageal perforation 10/7 Esoph XR: No leak associated  with stent coverage of esophageal perforation 10/14 KUB: Gas-filled loops of small bowel and colon suggestive of ileus 10/15 Esoph XR: esophageal perforation/leak at the distal esophagus, proximal to the esophageal stent 10/24 esophagram: persistent leak despite being covered by the esophageal stent.  11/25 KUB: normal gas pattern 11/28 KUB: no acute findings  11/28 CT: concerning for severe enteritis.  Again noted is a pocket of gas along the left side of the mid/distal esophagus 12/1 KUB- nonobstructive bowel gas, no acute findings  12/1 esophogram  GI Surgeries / Procedures:  10/6: esophageal stent placement  10/15 EGD and stent repositioning  11/26 esophageal stent removal   Central access: 01/22/24 TPN start date: 01/22/24- 11/11; 12/1 >>   Goal TPN = 92ml/hr (51 g/L AA, 16% dextrose , 32 g/L lipid) provides 110 g protein and 2308 kcal    RD Assessment:  Estimated Needs Total Energy Estimated Needs: 2300-2500 kcals Total Protein Estimated Needs: 110-130 g Total Fluid Estimated Needs: 2L   Current Nutrition:  TPN + NPO  11/28 pain and vomiting> CT with severe enteritis   Plan:   Consult received after due time, will plan to start 12/2   Stop SSI   Thank you for involving pharmacy in this patient's care.  Jinnie Door, PharmD, BCPS, BCCP Clinical Pharmacist  Please check AMION for all [facility] Pharmacy phone numbers After 10:00 PM, call Main Pharmacy [phone]

## 2024-03-18 NOTE — Progress Notes (Addendum)
 RN advised by MD to page TCTS for further evaluation TCTS paged and presented to beside to assess patient. No new orders. Advised needed further follow-up with Gastrology.

## 2024-03-18 NOTE — Progress Notes (Signed)
 RN provided patient with Omnipaque  oral solution 500ml  and advise patient ot drink over one hour - did not have to drink all at once. Patient finished first bottle at 1715.   1801 Pt started drinking second 500 ml bottle of Omnipaque  oral solution finished bottle at 1820. CT notified patient completed both bottles of solution.

## 2024-03-18 NOTE — TOC Progression Note (Signed)
 Transition of Care [facility]) - Progression Note    Patient Details  Name: Billy [name] MRN: [id number] Date of Birth: 1951/03/11  Transition of Care [facility]) CM/SW Contact  Isaiah Public, LCSWA Phone Number: 03/18/2024, 11:09 AM  Clinical Narrative:     Patient has SN[facility] bed at [facility] when medically ready. CSW spoke with tammy with [facility] who confirmed SNF has been approved # [id number] and PTAR has been approved #[id number]. CSW informed MD. CSW will continue to follow.  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, SNF Pending bed offer               Expected Discharge Plan and Services In-house Referral: Clinical Social Work Discharge Planning Services: [employer] Consult Post Acute Care Choice: Skilled Nursing Facility Living arrangements for the past 2 months: Single Family Home                                       Social Drivers of Health (SDOH) Interventions SDOH Screenings   Food Insecurity: No Food Insecurity (01/22/2024)  Housing: High Risk (01/22/2024)  Transportation Needs: No Transportation Needs (01/22/2024)  Utilities: Not At Risk (01/22/2024)  Depression (PHQ2-9): Low Risk  (12/01/2020)  Social Connections: Socially Isolated (01/22/2024)  Tobacco Use: Low Risk  (03/13/2024)    Readmission Risk Interventions    06/27/2023    1:52 PM 10/14/2022   11:29 AM  Readmission Risk Prevention Plan  Post Dischage Appt  Complete  Medication Screening  Complete  Transportation Screening Complete Complete  HRI or Home Care Consult Complete   Social Work Consult for Recovery Care Planning/Counseling Complete   Palliative Care Screening Not Applicable   Medication Review Oceanographer) Referral to Pharmacy

## 2024-03-18 NOTE — Progress Notes (Addendum)
 Nutrition Follow-up  DOCUMENTATION CODES:   Severe malnutrition in context of acute illness/injury  INTERVENTION:  Recommend Parenteral Nutrition - titrate to meet estimated nutrition needs.  Kcal: 2300-2500 kcals Protein: 110-130g per day    If/when able to continue TF via PEG: [place] Standard 1.4 to 65 ml/h (1560 ml per day) -Prosource TF20 60ml daily - FWF: 100 mL q6h (400 mL/day) -Provides 2264 kcal, 117 gm protein, 1508 ml free water  daily    Discontinue Banatrol QID -provides 45kcal, 5g soluble fiber per serving Discontinue Florastor BID for re-establishing gut microbiome  Monitor diet advancement and tolerance and adjust nutrition support, as PO intake allows Recommend repeat NFPE on follow up   NUTRITION DIAGNOSIS:  Severe Malnutrition related to acute illness (may have chronic component due to time frame of reported wt loss) as evidenced by severe fat depletion, moderate muscle depletion. - remains applicable  GOAL:  Patient will meet greater than or equal to 90% of their needs - not progressing   MONITOR:  Diet advancement, Labs, Weight trends, I & O's, Skin (TPN)  REASON FOR ASSESSMENT:   Consult Enteral/tube feeding initiation and management  ASSESSMENT:   73 yo male admitted post several days of N/V followed by sudden onset of severe epigastric pain radiating to chest and back. Pt found to have small esophageal perforation above GE junction. PMH includes CAD, chronic HFpEF, HTN, HLD, DVT, CVA, PAF, CKD 3b.  10/05 Admitted 10/06 Esophagogram/barium study: distal esophageal perforation, TPN started 10/07 S/p Esophageal stent, TPN at goal rate of 80 ml/hr 10/08  CLD 10/10 Diet advanced to Dysphagia 3, Calorie Count 10/13 No meal tickets for calorie count, pt reports eating bites only 10/15 Esophageal stent migration with leak on esophagram with return to OR for stent repositioning, likely aspiration event requiring intubation 10/16 Extubated, Chest tube  placed 10/17 Limited echo with EF 50-55%, RV mildly reduced 10/20 Intubated early AM, L pleural effusion, pigtail chest tube inserted 10/23 - extubated; limited echo: 60-65% 10/24 - esophagram: persistent leak 10/28 - transfer out of ICU 11/03 - PEG tube placement  11/04 - TF started 11/5 - TF at 20 ml/hr 11/6 - TPN decreased to meet 50% of pt's needs, TF at 50 ml/hr,  11/7 - TF at 60 ml/hr, multiple loose BM, decrease rate of tube feeds to 45 ml/hr, continue TPN to meet 50% of needs 11/10 - increase TF to 35ml/hr; TPN meet 50% of needs 11/11 - increase TF to goal rate (modified to Vital 1.5 as Mallie Pinion out of stock); d/c TPN tomorrow 11/12 - modify to Vital 1.5 @60ml /hr (goal rate) 11/15 - increased stool output; modified back to KF1.4 @65ml /hr (goal rate) 11/20 - modified to boluses 11/21 - developed chest pain in the evening; modified back to continuous 11/23 - CT chest: small b/l pleural effusions w/ bibasilar atelectasis, PNA not excluded, scattered clusters of nodular density predominantly in upper and right lower lobes 11/25 - Xray abd: PEG tube in stomach w/ no contrast extravasation noted 11/26 - esophageal stent removal; advanced to clear liquid diet 11/28 - enteritis; TF on hold; GI consulted 11/29 - GI singed off 11/30 - TF re-initiated at 71ml/hr and titrating up 12/01 - TF paused due to severe epigastric pain + N/V, GI re-consulted  12/02 - TPN initiated  Patient esophageal stent removed and diet has been advanced. Not tolerating well on Friday (11/28). GI  reconsulted and patient found to have enteritis/duodenitis on CT. GI recommending continue clear liquid diet as  able and medical management via antiemetics and carafate. Patient also trialed draining PEG tube site and noted with red/dark brownish color output.   TF re-initiated yesterday with N/V and severe epigrastric pain reported again and subsequently placed on hold. He has now been without consistent  calorie/protein intake x3 days. GI re-consulted. Abdominal xray showing PEG in appropriate position. Repeat esophagram pending.   Attempted to page CTS to discuss need for TPN given inability to tolerate gastric feedings. Did discuss with attending. In the setting of severe enteritis/duodenitis, bowel rest would be indicated.   Calorie and protein recommendations remain unchanged and can be viewed above. RN reports dark maroon colored fluid with sediment in line coming from PEG tube site this morning.    Admit Weight: 72.8 kg Current Weight: 82.2 kg Lowest Weight: 66.8 kg on 11/06   Questionable weight gain of 13lbs overnight. Will continue to monitor trend. He is noted with moderate, pitting edema to BUEs. Continues to average 2-3 BMs per day with Lomotil  scheduled BID.    BUN/Crt stable. Lasix  restarted 11/24. Transitioned to torsemide  today, however subsequently placed on hold due to c/f dehydration and G tube output. Potassium low requiring supplementation today. No requiring regular antiemetic therapy.   Drains/Lines: R brachial: PICC, triple lumen LUQ: G-tube (24Fr) placed 11/03   Labs from 11/10 reviewd: Sodium 132 (L) Potassium 3.5 (wdl) BUN 32 Creatinine 1.73  Corr Ca unable to calculate WBC 8.2 (wdl) CBGs 123-132 x24 hours A1c 6.0 (01/2024)   Meds: Lomitil BID SS Novolog  Pantoprazole  Florastor Sulcarafate Torsemide  IV ABX   IV Phenergan  x1   NUTRITION - FOCUSED PHYSICAL EXAM: Recommend repeat NFPE on follow up assessment  Diet Order:   Diet Order             Diet NPO time specified Except for: Ice Chips  Diet effective now            EDUCATION NEEDS:  Education needs have been addressed  Skin:  Skin Assessment: Skin Integrity Issues: Skin Integrity Issues:: Stage I Stage I: buttocks, bilateral feet  Last BM:  11/30 - type 7 x3  Height:  Ht Readings from Last 1 Encounters:  03/13/24 6' 1 (1.854 m)   Weight:  Wt Readings from Last 1 Encounters:   03/17/24 82.2 kg   Ideal Body Weight:  83.6 kg  BMI:  Body mass index is 23.91 kg/m.  Estimated Nutritional Needs:   Kcal:  2300-2500 kcals  Protein:  110-130 g  Fluid:  2L  Blair Deaner MS, RD, LDN Registered Dietitian Clinical Nutrition RD Inpatient Contact Info in Amion

## 2024-03-19 ENCOUNTER — Inpatient Hospital Stay (HOSPITAL_COMMUNITY)

## 2024-03-19 ENCOUNTER — Encounter (HOSPITAL_COMMUNITY)
Admission: EM | Disposition: A | Discharge status: Skilled Nursing Facility | Payer: Self-pay | Source: Home / Self Care | Attending: Family Medicine

## 2024-03-19 ENCOUNTER — Telehealth (HOSPITAL_COMMUNITY): Payer: Self-pay

## 2024-03-19 ENCOUNTER — Encounter (HOSPITAL_COMMUNITY): Payer: Self-pay | Admitting: Internal Medicine

## 2024-03-19 DIAGNOSIS — I110 Hypertensive heart disease with heart failure: Secondary | ICD-10-CM

## 2024-03-19 DIAGNOSIS — I5023 Acute on chronic systolic (congestive) heart failure: Secondary | ICD-10-CM | POA: Diagnosis not present

## 2024-03-19 DIAGNOSIS — I480 Paroxysmal atrial fibrillation: Secondary | ICD-10-CM | POA: Diagnosis not present

## 2024-03-19 DIAGNOSIS — K269 Duodenal ulcer, unspecified as acute or chronic, without hemorrhage or perforation: Secondary | ICD-10-CM | POA: Diagnosis not present

## 2024-03-19 DIAGNOSIS — I4891 Unspecified atrial fibrillation: Secondary | ICD-10-CM

## 2024-03-19 DIAGNOSIS — K209 Esophagitis, unspecified without bleeding: Secondary | ICD-10-CM | POA: Diagnosis not present

## 2024-03-19 HISTORY — PX: ESOPHAGOGASTRODUODENOSCOPY: SHX5428

## 2024-03-19 LAB — CBC
HCT: 30.2 % — ABNORMAL LOW (ref 39.0–52.0)
HCT: 30.5 % — ABNORMAL LOW (ref 39.0–52.0)
HCT: 33.1 % — ABNORMAL LOW (ref 39.0–52.0)
Hemoglobin: 10.5 g/dL — ABNORMAL LOW (ref 13.0–17.0)
Hemoglobin: 9.5 g/dL — ABNORMAL LOW (ref 13.0–17.0)
Hemoglobin: 9.5 g/dL — ABNORMAL LOW (ref 13.0–17.0)
MCH: 27.5 pg (ref 26.0–34.0)
MCH: 27.5 pg (ref 26.0–34.0)
MCH: 27.7 pg (ref 26.0–34.0)
MCHC: 31.1 g/dL (ref 30.0–36.0)
MCHC: 31.5 g/dL (ref 30.0–36.0)
MCHC: 31.7 g/dL (ref 30.0–36.0)
MCV: 87.3 fL (ref 80.0–100.0)
MCV: 87.5 fL (ref 80.0–100.0)
MCV: 88.2 fL (ref 80.0–100.0)
Platelets: 183 K/uL (ref 150–400)
Platelets: 191 K/uL (ref 150–400)
Platelets: 198 K/uL (ref 150–400)
RBC: 3.45 MIL/uL — ABNORMAL LOW (ref 4.22–5.81)
RBC: 3.46 MIL/uL — ABNORMAL LOW (ref 4.22–5.81)
RBC: 3.79 MIL/uL — ABNORMAL LOW (ref 4.22–5.81)
RDW: 16.6 % — ABNORMAL HIGH (ref 11.5–15.5)
RDW: 16.7 % — ABNORMAL HIGH (ref 11.5–15.5)
RDW: 16.7 % — ABNORMAL HIGH (ref 11.5–15.5)
WBC: 11.2 K/uL — ABNORMAL HIGH (ref 4.0–10.5)
WBC: 11.5 K/uL — ABNORMAL HIGH (ref 4.0–10.5)
WBC: 11.9 K/uL — ABNORMAL HIGH (ref 4.0–10.5)
nRBC: 0 % (ref 0.0–0.2)
nRBC: 0 % (ref 0.0–0.2)
nRBC: 0 % (ref 0.0–0.2)

## 2024-03-19 LAB — BASIC METABOLIC PANEL WITH GFR
Anion gap: 11 (ref 5–15)
Anion gap: 7 (ref 5–15)
BUN: 25 mg/dL — ABNORMAL HIGH (ref 8–23)
BUN: 27 mg/dL — ABNORMAL HIGH (ref 8–23)
CO2: 22 mmol/L (ref 22–32)
CO2: 23 mmol/L (ref 22–32)
Calcium: 7.4 mg/dL — ABNORMAL LOW (ref 8.9–10.3)
Calcium: 7.9 mg/dL — ABNORMAL LOW (ref 8.9–10.3)
Chloride: 104 mmol/L (ref 98–111)
Chloride: 108 mmol/L (ref 98–111)
Creatinine, Ser: 1.65 mg/dL — ABNORMAL HIGH (ref 0.61–1.24)
Creatinine, Ser: 1.58 mg/dL — ABNORMAL HIGH (ref 0.61–1.24)
GFR, Estimated: 44 mL/min — ABNORMAL LOW (ref >60)
GFR, Estimated: 46 mL/min — ABNORMAL LOW (ref >60)
Glucose, Bld: 547 mg/dL (ref 70–99)
Glucose, Bld: 150 mg/dL — ABNORMAL HIGH (ref 70–99)
Potassium: 3.4 mmol/L — ABNORMAL LOW (ref 3.5–5.1)
Potassium: 3.5 mmol/L (ref 3.5–5.1)
Sodium: 137 mmol/L (ref 135–145)
Sodium: 138 mmol/L (ref 135–145)

## 2024-03-19 LAB — GLUCOSE, CAPILLARY
Glucose-Capillary: 115 mg/dL — ABNORMAL HIGH (ref 70–99)
Glucose-Capillary: 130 mg/dL — ABNORMAL HIGH (ref 70–99)
Glucose-Capillary: 133 mg/dL — ABNORMAL HIGH (ref 70–99)
Glucose-Capillary: 141 mg/dL — ABNORMAL HIGH (ref 70–99)
Glucose-Capillary: 150 mg/dL — ABNORMAL HIGH (ref 70–99)
Glucose-Capillary: 152 mg/dL — ABNORMAL HIGH (ref 70–99)

## 2024-03-19 LAB — MAGNESIUM: Magnesium: 2 mg/dL (ref 1.7–2.4)

## 2024-03-19 LAB — PHOSPHORUS: Phosphorus: 3 mg/dL (ref 2.5–4.6)

## 2024-03-19 MED ORDER — DEXTROSE IN LACTATED RINGERS 5 % IV SOLN: INTRAVENOUS | Status: DC | PRN

## 2024-03-19 MED ORDER — SODIUM CHLORIDE 0.9 % IV SOLN: INTRAVENOUS | Status: DC

## 2024-03-19 MED ORDER — LIDOCAINE 2% (20 MG/ML) 5 ML SYRINGE
INTRAMUSCULAR | Status: DC | PRN
  Administered 2024-03-19: 100 mg via INTRAVENOUS

## 2024-03-19 MED ORDER — PROPOFOL 10 MG/ML IV BOLUS
INTRAVENOUS | Status: DC | PRN
  Administered 2024-03-19: 50 mg via INTRAVENOUS
  Administered 2024-03-19: 90 ug/kg/min via INTRAVENOUS

## 2024-03-19 MED ORDER — KATE FARMS STANDARD 1.4 EN LIQD
1000.0000 mL | ENTERAL | Status: DC
  Administered 2024-03-20 (×2): 1000 mL
  Filled 2024-03-19 (×2): qty 1000

## 2024-03-19 MED ORDER — POTASSIUM CHLORIDE 10 MEQ/100ML IV SOLN
10.0000 meq | INTRAVENOUS | Status: AC
  Administered 2024-03-19 (×2): 10 meq via INTRAVENOUS
  Filled 2024-03-19 (×2): qty 100

## 2024-03-19 MED ORDER — SODIUM CHLORIDE 0.9 % IV SOLN: INTRAVENOUS | Status: DC | PRN

## 2024-03-19 MED ORDER — FENTANYL CITRATE (PF) 100 MCG/2ML IJ SOLN
INTRAMUSCULAR | Status: AC
  Filled 2024-03-19: qty 2

## 2024-03-19 MED ORDER — SUCRALFATE 1 GM/10ML PO SUSP
1.0000 g | Freq: Four times a day (QID) | ORAL | Status: DC
  Administered 2024-03-19 – 2024-03-29 (×40): 1 g
  Filled 2024-03-19 (×41): qty 10

## 2024-03-19 MED ORDER — TRAVASOL 10 % IV SOLN
INTRAVENOUS | Status: AC
  Filled 2024-03-19: qty 795.6

## 2024-03-19 NOTE — Progress Notes (Addendum)
 PROGRESS NOTE    Billy [name]  FMW:[id number] DOB: June 04, 1950 DOA: 01/21/2024 PCP: Seabron Lenis, MD  Subjective: No new subjective & objective note has been filed under this hospital service since the last note was generated.    Hospital Course: 73 year old with history of cardiac arrest earlier this year, congestive heart failure with reduced EF, A-fib, PE presenting with nausea vomiting found to have esophageal rupture/tear requiring stent placement 01/23/2024.  Swallow study revealed additional leak and had stent repositioning 01/31/2024. 10/15 stent repositioned. There was concerns of gastric content aspiration therefore mechanically ventilated in the ICU.  Bedside ultrasound revealed right-sided pleural effusion requiring chest tube placement with removal of 300 cc of exudative fluid. He was also successfully extubated on 10/16. Patient has had increased oxygen  requirement requiring reintubation on 10/20. 10/24 barium swallow showed persistent leak. Eventually, patient was extubated and transferred out of ICU on 10/28. 11/3 PEG tube placed. 11/19 swelling of right arm showing on ultrasound SVT cephalic vein.   Hospital course complicated by acute hypoxic respiratory, PNA, acute on chronic HFpEF for which he is on antibiotics and IV diuresis per advanced heart failure team. PEG tube placed on 11/3.  Dietitian consulted for initiation of tube feed. Taken off TPN as was tolerating tube feed at goal.   11/25 PEG tube placement x-ray shows no extravasation. 11/27 torsemide  resumed for upper extremity edema. 11/28 increased epigastric pain and N/V.  Severe duodenitis on repeat CT.  GI re-consulted.  Made NPO+ice, tube feeds stopped and G-tube placed to gravity drain. 11/29 N/V improved. Still epigastric pain but improved.  Re-trial clear liquids with G tube off drain with TF running.12/1 developed severe epigastric pain with n/v, significant output from G tube initially just tube feeds then  coffee-ground color. Also developed melena. Underwent EGD with severe worsened gastroenteritis and multiple ischemic ulcers.   Assessment and Plan:  Boerhaave syndrome  Esophageal rupture - events as above - last esophagram 10/24 showed persistent leak. -11/26 esophageal stent removal with no evidence of extravasation on esophagram in OR - repeat esophagogram 12/1 with no leak - Status post esophageal stent removal on 11/26 - CT surgery signed off 12/2  - cont PPI IV BID - increase sucralfate TID to QID - cont IV dilaudid  0.5mg  q2h PRN - plan to remove PEG tube outpatient - will need CT surgery follow up outpatient   Severe gastroenteritis Ischemic ulcer disease - repeat CT A/P 12/1 showed persistent severe circumferential thickening of duodenum, and proximal jejunum, small area of free fluid in abdomen and pelvis, small bilateral pleural effusions, postsurgical esophagus findings - continues to have severe epigastric pain with nausea and intermittent vomiting  - EGD 12/2 with severe gastritis, multiple non-bleeding superficial duodenal and jejural ischemic ulcers - IV fluids while unable to tolerate tube feeds, take PO, or off TPN - Continue IV Zosyn , duration TBD  - cont PPI IV BID - cont sucralfate - cont maalox PRN - cont phenergan  IV PRN - plan to restart tube feeds on 12/3 and monitor for tolerance - continue NPO today. Plan to restart clear liquid diet on 12/4 if tolerating tube feeds on 12/3 - GI following - dietician following   Anemia Coffee Ground output from PEG Melena - Related to severe gastroenteritis and peptic ulcer disease - Hgb 9.5 < 10.5, baseline 8-12 - hold eliquis  for now - cont PPI IV BID and sucralfate as elsewhere - repeat type and screen - transfuse for Hgb goal > 7 - monitor CBC  Lung nodules  - seen on recent chest CT - likely secondary to aspiration /effusion per CT Surgery -- pain control as elsewhere - monitor clinically    Upper  back pain Musculoskeletal in nature imaging done as above 11/22 nonspecific-overall improved --Continue Flexeril  5 3 times daily as needed --Mobilize    Left arm swelling --Slightly improved with Lasix  --hold home torsemide  given concerns of dehydration with ongoing PEG output --Elevate arms in bed   Superficial venous thrombosis  - noted as above --Monitor --Hold Eliquis  given for now. Resume no signs/symptoms of acute bleed and Hgb stable in the next 48-72 hours - monitor CBC    RUE ecchymosis and petechiae Noted about PICC site, originally placed 02/05/24 No warmth, tenderness, discharge, induration, fluctuance Discussed with IV team. Might be a CHG/secure port reaction from not letting it dry and placing the dressing.  Low concern for acute infection Cont local skin care as per IV team recs Monitor clinically   Acute hypoxic respiratory failure  Pneumonia Pleural effusion Acute CHF exacerbation Respiratory failure multifactorial in the setting of pneumonia, pleural effusion, CHF exacerbation.  - Improved with Lasix  and antibiotics. - Continue Xopenex  inhalers  - Mucinex   --hold home torsemide  given concerns of dehydration with ongoing PEG output   Paroxysmal A-fib  - DCCV 07/06/2023 - Amiodarone  200mg  daily.  Plan to taper off as able as converted to NSR and depending on next steps with surgery? --metoprolol  as needed IV for heart rate above 110   History of PE with DVTs  - with IVC filter placed 3/25 - hold Eliquis  5 for now. Resume no signs/symptoms of acute bleed and Hgb stable in the next 48-72 hours - monitor CBC  - monitor CBC   Hypertension Hyperlipidemia - maybe related to intractable pain with nausea and vomiting - cont IV labetalol  and IV hydralazine  PRN - Hydralazine  100 mg Q8H - atorvastatin  80  - Zetia  10  - Isordil  10 twice daily - metoprolol  12.5 mg BID  - losartan  25 daily  - hold home torsemide  given concerns of dehydration with significant  PEG output --Monitor BP and renal function   Bipolar disorder  - overall stable - Appears not to be on medications.   Insomnia --Continue trazodone     Protein energy malnutrition BMI 21 --Supplements and vitamins - tube feeds on hold again - may need to resume TPN again - will touch base with Dietitian for recommendations    CKD stage 3B -- stable  11/30 - Cr stable & improved 1.97 >> 1.51 >> 1.58 --Monitor   Metabolic acidosis - resolved --Monitor labs   Multiple bedsores and wounds anterior right and left foot and injury stage I to bottom --Continue Gerhardt twice daily  --Lidoderm  patch for back pain --Schedule Lomotil  1 tablet BID and QID PRN --Diarrhea overall has improved   History of Cardiac arrest 06/2023   DVT prophylaxis: Place TED hose Start: 02/27/24 1903 SCDs Start: 01/21/24 2023  SCDs   Code Status: Full Code  Disposition Plan: SNF Reason for continuing need for hospitalization: severity of illness  Objective: Vitals:   03/19/24 0952 03/19/24 1000 03/19/24 1013 03/19/24 1029  BP: (!) 175/80 (!) 181/79 (!) 188/78 (!) 190/76  Pulse: 80 77 77 77  Resp: 18 14 17 14   Temp:    97.7 F (36.5 C)  TempSrc:    Oral  SpO2: 98% 99% 98% 98%  Weight:      Height:        Intake/Output  Summary (Last 24 hours) at 03/19/2024 1212 Last data filed at 03/19/2024 0930 Gross per 24 hour  Intake 1308.7 ml  Output --  Net 1308.7 ml   Filed Weights   03/17/24 0415 03/19/24 0341 03/19/24 0844  Weight: 82.2 kg 82.6 kg 82.6 kg    Examination:  Physical Exam Vitals and nursing note reviewed.  Constitutional:      General: He is not in acute distress.    Appearance: He is ill-appearing.     Comments: Weak, frail  HENT:     Head: Normocephalic and atraumatic.  Cardiovascular:     Rate and Rhythm: Normal rate and regular rhythm.     Pulses: Normal pulses.     Heart sounds: Normal heart sounds.  Pulmonary:     Effort: Pulmonary effort is normal.     Breath  sounds: Normal breath sounds.  Abdominal:     General: Bowel sounds are normal.     Palpations: Abdomen is soft.  Neurological:     Mental Status: He is alert.     Data Reviewed: I have personally reviewed following labs and imaging studies  CBC: Recent Labs  Lab 03/13/24 0520 03/14/24 0435 03/15/24 0354 03/16/24 0830 03/18/24 0420 03/18/24 1134 03/18/24 2038 03/19/24 0005 03/19/24 0420  WBC 11.2* 10.4 10.4   < > 8.2 11.5* 13.8* 11.5* 11.2*  NEUTROABS 9.8* 9.3* 8.8*  --   --   --   --   --   --   HGB 10.3* 9.8* 10.5*   < > 9.2* 10.3* 11.8* 10.5* 9.5*  HCT 33.0* 31.7* 33.7*   < > 29.7* 33.0* 37.3* 33.1* 30.5*  MCV 89.9 90.1 89.4   < > 89.2 89.4 87.1 87.3 88.2  PLT 206 220 215   < > 170 194 214 198 183   < > = values in this interval not displayed.   Basic Metabolic Panel: Recent Labs  Lab 03/16/24 0830 03/17/24 0530 03/18/24 0420 03/19/24 0420 03/19/24 0613  NA 139 137 136 137 138  K 4.0 3.8 3.5 3.4* 3.5  CL 106 105 104 104 108  CO2 23 23 25 22 23   GLUCOSE 107* 123* 132* 547* 150*  BUN 30* 29* 32* 25* 27*  CREATININE 1.51* 1.58* 1.73* 1.65* 1.58*  CALCIUM  8.1* 7.9* 7.6* 7.4* 7.9*  MG 2.0  --   --   --  2.0  PHOS 2.4* 2.2* 2.8  --  3.0   GFR: Estimated Creatinine Clearance: 48.5 mL/min (A) (by C-G formula based on SCr of 1.58 mg/dL (H)). Liver Function Tests: Recent Labs  Lab 03/13/24 0520 03/14/24 0435  AST 41 38  ALT 66* 75*  ALKPHOS 65 67  BILITOT 0.8 0.2  PROT 5.4* 4.8*  ALBUMIN  1.8* 1.6*   No results for input(s): LIPASE, AMYLASE in the last 168 hours. No results for input(s): AMMONIA in the last 168 hours. Coagulation Profile: No results for input(s): INR, PROTIME in the last 168 hours. Cardiac Enzymes: No results for input(s): CKTOTAL, CKMB, CKMBINDEX, TROPONINI in the last 168 hours. ProBNP, BNP (last 5 results) Recent Labs    06/27/23 1116  BNP 646.4*   HbA1C: No results for input(s): HGBA1C in the last 72  hours. CBG: Recent Labs  Lab 03/18/24 1702 03/18/24 2013 03/19/24 0000 03/19/24 0409 03/19/24 0808  GLUCAP 146* 140* 152* 150* 130*   Lipid Profile: No results for input(s): CHOL, HDL, LDLCALC, TRIG, CHOLHDL, LDLDIRECT in the last 72 hours. Thyroid  Function Tests: No results for  input(s): TSH, T4TOTAL, FREET4, T3FREE, THYROIDAB in the last 72 hours. Anemia Panel: No results for input(s): VITAMINB12, FOLATE, FERRITIN, TIBC, IRON , RETICCTPCT in the last 72 hours. Sepsis Labs: No results for input(s): PROCALCITON, LATICACIDVEN in the last 168 hours.  Recent Results (from the past 240 hours)  Culture, blood (Routine X 2) w Reflex to ID Panel     Status: None   Collection Time: 03/09/24  9:04 PM   Specimen: BLOOD  Result Value Ref Range Status   Specimen Description BLOOD SITE NOT SPECIFIED  Final   Special Requests   Final    BOTTLES DRAWN AEROBIC AND ANAEROBIC Blood Culture adequate volume   Culture   Final    NO GROWTH 5 DAYS Performed at [facility], [address], [place], [place] [address]    Report Status 03/14/2024 FINAL  Final  Culture, blood (Routine X 2) w Reflex to ID Panel     Status: None   Collection Time: 03/09/24  9:04 PM   Specimen: BLOOD  Result Value Ref Range Status   Specimen Description BLOOD SITE NOT SPECIFIED  Final   Special Requests   Final    BOTTLES DRAWN AEROBIC AND ANAEROBIC Blood Culture adequate volume   Culture   Final    NO GROWTH 5 DAYS Performed at [facility], [address], [place], [place] [address]    Report Status 03/14/2024 FINAL  Final  Surgical pcr screen     Status: Abnormal   Collection Time: 03/12/24  4:05 PM   Specimen: Nasal Mucosa; Nasal Swab  Result Value Ref Range Status   MRSA, PCR POSITIVE (A) NEGATIVE Final    Comment: RESULT CALLED TO, READ BACK BY AND VERIFIED WITH: RN T. LILIAN [phone] @2023  FH    Staphylococcus aureus POSITIVE (A) NEGATIVE Final     Comment: (NOTE) The Xpert SA Assay (FDA approved for NASAL specimens in patients 72 years of age and older), is one component of a comprehensive surveillance program. It is not intended to diagnose infection nor to guide or monitor treatment. Performed at [facility], [address], [place], [place] [address]      Radiology Studies: CT ABDOMEN PELVIS W CONTRAST Result Date: 03/18/2024 CLINICAL DATA:  Provided history: Abdominal pain, acute (Ped 0-17y) EXAM: CT ABDOMEN AND PELVIS WITH CONTRAST TECHNIQUE: Multidetector CT imaging of the abdomen and pelvis was performed using the standard protocol following bolus administration of intravenous contrast. RADIATION DOSE REDUCTION: This exam was performed according to the departmental dose-optimization program which includes automated exposure control, adjustment of the mA and/or kV according to patient size and/or use of iterative reconstruction technique. CONTRAST:  75mL OMNIPAQUE  IOHEXOL  350 MG/ML SOLN COMPARISON:  CT 3 days ago 03/15/2024 FINDINGS: Lower chest: Small bilateral pleural effusions, minimally diminished from recent prior. Chronic lung disease in the bases. Please reference recent chest CT for complete thoracic assessment. Pocket of gas adjacent to the left side of the mid/distal esophagus is only partially included in the field of view, possibly diminished from recent prior. Hepatobiliary: No focal liver abnormality. Unremarkable appearance of the gallbladder. No biliary dilatation. Pancreas: No evidence of pancreatic inflammation. Assessment of the pancreatic head is again limited due to adjacent duodenal wall thickening. Spleen: Normal in size without focal abnormality. Adrenals/Urinary Tract: Normal adrenal glands. Chronic left renal atrophy. No hydronephrosis or focal renal abnormality of the right kidney. Unremarkable appearance of the urinary bladder. Stomach/Bowel: Gastrostomy tube in the stomach. Wall thickening of  the distal  esophagus which contains a small amount of enteric contrast. The stomach is physiologically distended. There is severe circumferential wall thickening about the transverse duodenum and proximal jejunum. No obstruction with enteric contrast reaching the colon. No new areas of small bowel wall thickening. The appendix is normal. No colonic inflammatory change. No bowel pneumatosis. Vascular/Lymphatic: Aortic atherosclerosis. Infrarenal IVC filter in place. Small upper abdominal lymph nodes are not enlarged by size criteria. Reproductive: Prostate is unremarkable. Other: Edema in the upper abdomen adjacent to proximal small bowel wall thickening. Small amount of free fluid adjacent to the liver, increased. Small amount of free fluid in the right pericolic gutter tracking into the pelvis. No free air or focal fluid collection. Patulous right inguinal canal. Generalized subcutaneous edema. Musculoskeletal: Stable degenerative change in the spine. IMPRESSION: 1. Persistent severe circumferential wall thickening about the duodenum and proximal jejunum, suspicious for enteritis. No obstruction with enteric contrast reaching the colon. 2. Small amount of free fluid in the abdomen and pelvis, increased over the last 3 days. 3. Small bilateral pleural effusions, minimally diminished. 4. Contrast in the distal esophagus can be seen in the setting of reflux. The small pocket of gas adjacent to the distal esophagus is only partially included in the field of view, possibly diminished from recent prior. Aortic Atherosclerosis (ICD10-I70.0). Electronically Signed   By: Andrea Gasman M.D.   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 Status post stent placement and removal. He presents for water -soluble UGI due to abdominal pain and emesis. CT on 03/15/24 demonstrated wall thickening involving the duodenum and  proximal jejunum. EXAM: DG UGI W SINGLE CM TECHNIQUE: Scout radiograph was obtained. Single contrast examination was performed using 200 mL of water -soluble contrast. 150 mL of the water -soluble contrast was given orally, 50 mL of the water -soluble contrast was given via the G-tube. The G tube was flushed with 10 mL of water  at the end of the exam. This exam was performed by Toya Cousin, PA-C, and was supervised and interpreted by [name] Balder, MD. FLUOROSCOPY: Radiation Exposure Index (as provided by the fluoroscopic device): 39.5 mGy Kerma COMPARISON:  CT chest abdomen without contrast on 03/15/2024, water -soluble esophagram on 02/09/2024, water  soluble esophagram on 01/31/2024, water -soluble esophagus on 01/23/2024 FINDINGS: Scout Radiograph: Scout image demonstrates an IVC filter and gastrostomy tube. Nonobstructive bowel gas pattern with gas in the colon. Central line tip in the SVC. Esophagus: Esophagus is patent with mild narrowing and irregularity in the mid esophagus. No evidence for an esophageal perforation. Mild dilatation of the distal esophagus with small amount of debris or filling defects in the distal esophagus near the GE junction. Esophageal motility: Esophageal motility was not evaluated formally. Proximal escape seen. Gastroesophageal reflux:  Not examined. Ingested 13mm barium tablet:  Not given. Stomach: Gastrostomy tube in place. Normal appearance of the stomach. Gastric emptying: Normal. Duodenum: Circumferential narrowing in the distal duodenum and proximal jejunum. Findings correspond with recent CT findings. Other:  Mildly dilated loops of proximal jejunum. IMPRESSION: 1. Esophagus is patent and no evidence for a perforation. 2. Mild irregularity in the mid esophagus possibly related to previous stent. 3. Diffuse narrowing of the lumen in the distal duodenum and proximal jejunum. Findings are suggestive for underlying wall thickening in this area. Distribution of disease is similar to the  prior CT from 03/15/2024. Electronically Signed   By: [name] Balder HERO.D.  On: 03/18/2024 16:52   DG Abd 1 View Result Date: 03/18/2024 EXAM: 1 VIEW XRAY OF THE ABDOMEN 03/18/2024 07:43:00 AM COMPARISON: 03/15/2024 CLINICAL HISTORY: Abdominal pain FINDINGS: LINES, TUBES AND DEVICES: Percutaneous gastrostomy tube in place. Unchanged IVC filter in place. BOWEL: Nonobstructive bowel gas pattern. SOFT TISSUES: No opaque urinary calculi. BONES: No acute osseous abnormality. IMPRESSION: 1. No acute findings. 2. Percutaneous gastrostomy tube in expected position. 3. Unchanged inferior vena cava filter in place. Electronically signed by: Waddell Calk MD 03/18/2024 08:06 AM EST RP Workstation: [id number]    Scheduled Meds:  amiodarone   200 mg Per Tube Daily   atorvastatin   80 mg Per Tube Daily   Chlorhexidine  Gluconate Cloth  6 each Topical Daily   diphenoxylate -atropine   1 tablet Per Tube BID   ezetimibe   10 mg Per Tube Daily   [name] butt cream   Topical BID   hydrALAZINE   100 mg Per Tube Q8H   isosorbide  dinitrate  10 mg Per Tube BID   lidocaine   1 patch Transdermal Q24H   losartan   25 mg Per Tube Daily   metoprolol  tartrate  12.5 mg Per Tube BID   pantoprazole  (PROTONIX ) IV  40 mg Intravenous Q12H   saccharomyces boulardii  250 mg Per Tube BID   sodium chloride  flush  10-40 mL Intracatheter Q12H   sucralfate   1 g Per Tube TID WC & HS   Continuous Infusions:  dextrose  5% lactated ringers      piperacillin -tazobactam (ZOSYN )  IV 3.375 g (03/19/24 0641)   potassium chloride  10 mEq (03/19/24 1136)   promethazine  (PHENERGAN ) injection (IM or IVPB) Stopped (03/18/24 0911)   TPN ADULT (ION)       LOS: 58 days   Norval Bar, MD  [facility]  03/19/2024, 12:12 PM

## 2024-03-19 NOTE — TOC Progression Note (Addendum)
 Transition of Care [facility]) - Progression Note    Patient Details  Name: Billy [name] MRN: [id number] Date of Birth: 08/30/1950  Transition of Care [facility]) CM/SW Contact  Lauraine FORBES Saa, LCSWA Phone Number: 03/19/2024, 12:23 PM  Clinical Narrative:     12:23 PM CSW attempted to cancel patient's SNF insurance authorization for [facility] as per progressions, patient is no longer medically stable for discharge due to GI bleeds. There was no response and a voicemail was left. CSW will continue to follow.  1:07 PM [facility] representative informed CSW that their SNF insurance authorizations cannot be cancelled, but patient's SNF insurance authorization expires today. CSW to resubmit SNF insurance authorization when patient is medically ready for discharge.  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, SNF Pending bed offer               Expected Discharge Plan and Services In-house Referral: Clinical Social Work Discharge Planning Services: [employer] Consult Post Acute Care Choice: Skilled Nursing Facility Living arrangements for the past 2 months: Single Family Home                                       Social Drivers of Health (SDOH) Interventions SDOH Screenings   Food Insecurity: No Food Insecurity (01/22/2024)  Housing: High Risk (01/22/2024)  Transportation Needs: No Transportation Needs (01/22/2024)  Utilities: Not At Risk (01/22/2024)  Depression (PHQ2-9): Low Risk  (12/01/2020)  Social Connections: Socially Isolated (01/22/2024)  Tobacco Use: Low Risk  (03/19/2024)    Readmission Risk Interventions    06/27/2023    1:52 PM 10/14/2022   11:29 AM  Readmission Risk Prevention Plan  Post Dischage Appt  Complete  Medication Screening  Complete  Transportation Screening Complete Complete  HRI or Home Care Consult Complete   Social Work Consult for Recovery Care Planning/Counseling Complete    Palliative Care Screening Not Applicable   Medication Review Oceanographer) Referral to Pharmacy

## 2024-03-19 NOTE — Anesthesia Postprocedure Evaluation (Signed)
 Anesthesia Post Note  Patient: Billy [name]  Procedure(s) Performed: EGD (ESOPHAGOGASTRODUODENOSCOPY)     Patient location during evaluation: PACU Anesthesia Type: MAC Level of consciousness: awake and alert Pain management: pain level controlled Vital Signs Assessment: post-procedure vital signs reviewed and stable Respiratory status: spontaneous breathing, nonlabored ventilation, respiratory function stable and patient connected to nasal cannula oxygen  Cardiovascular status: stable and blood pressure returned to baseline Postop Assessment: no apparent nausea or vomiting Anesthetic complications: no   No notable events documented.  Last Vitals:  Vitals:   03/19/24 1215 03/19/24 1650  BP: (!) 182/79 (!) 178/82  Pulse: 77 82  Resp: 18 19  Temp: 36.4 C 36.8 C  SpO2: 99% 93%    Last Pain:  Vitals:   03/19/24 1710  TempSrc:   PainSc: 7                  Billy [name]

## 2024-03-19 NOTE — Progress Notes (Signed)
 PT Cancellation Note  Patient Details Name: Tyion Boylen MRN: [id number] DOB: 10-02-1950   Cancelled Treatment:    Reason Eval/Treat Not Completed: (P) Patient at procedure or test/unavailable (pt off unit for EGD.) Will continue efforts per PT plan of care as schedule permits.   Maily Debarge M Kenishia Plack 03/19/2024, 9:11 AM

## 2024-03-19 NOTE — Plan of Care (Signed)
  Problem: Education: Goal: Knowledge of General Education information will improve Description: Including pain rating scale, medication(s)/side effects and non-pharmacologic comfort measures Outcome: Progressing   Problem: Health Behavior/Discharge Planning: Goal: Ability to manage health-related needs will improve Outcome: Progressing   Problem: Pain Managment: Goal: General experience of comfort will improve and/or be controlled Outcome: Progressing   Problem: Tissue Perfusion: Goal: Adequacy of tissue perfusion will improve Outcome: Progressing

## 2024-03-19 NOTE — Progress Notes (Signed)
 PHARMACY - TOTAL PARENTERAL NUTRITION CONSULT NOTE   Indication: intolerance to enteral feedings, esophageal perforation  Patient Measurements: Height: 6' 1 (185.4 cm) Weight: 82.6 kg (182 lb 1.6 oz) IBW/kg (Calculated) : 79.9 TPN AdjBW (KG): 78.8 Body mass index is 24.03 kg/m.  Assessment:  73 year old man with medical history significant for cardiac arrest earlier this year who was admitted after several days of nausea and vomiting followed by sudden onset severe tearing epigastric pain radiating to back and chest. 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 Pharmacy consulted to initiate TPN due to inability to tolerate enteral nutrition at this time.  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 PTA takes torsemide  20 mg PO daily, now held. Aggressive fluid resuscitation recommended per GI. Per HF, patient currently euvolemic but will hold diuretic for now. Due to CHF will target lower end of fluid range for TPN and supplement additional fluids per MD outside of TPN to prevent fluid overload.   Stent placed on 10/6, esophogram not showing any leak. Diet advanced to CLD. 10/15 Leak present and made NPO. 10/24 esophagram with persistent leak; will need stent replacement in 3-4 weeks.   12/1 re-consulted for TPN due to maroon output from PEG, N/V and concern for enteritis/duodenitis. TF held on 11/29 unlikely to be high re-feeding risk.  Glucose / Insulin : A1C 6.0% (02/2023);  BG< 150, used 2 units SSI/24hr, previously did not need insulin  when on TPN  Electrolytes:  K 3.5, other lytes wnl Renal: SCr 1.58 (BL ~2), BUN 27 Hepatic:  last alk phos/AST/ALT/tbili wnl 11/27, alb 1.6, TG wnl 11/10 Intake / Output; MIVF: I/O + 1.5L, emesis x 1 (unmeasured) 12/1, LBM 12/2    GI Imaging: 10/5 CT: concerning for esophageal perforation, masslike area of soft tissue thickening adjacent to and surrounding the esophagus  10/6 Esoph XR: distal esophageal  perforation 10/7 Esoph XR: No leak associated with stent coverage of esophageal perforation 10/14 KUB: Gas-filled loops of small bowel and colon suggestive of ileus 10/15 Esoph XR: esophageal perforation/leak at the distal esophagus, proximal to the esophageal stent 10/24 esophagram: persistent leak despite being covered by the esophageal stent.  11/25 KUB: normal gas pattern 11/28 KUB: no acute findings  11/28 CT: concerning for severe enteritis.  Again noted is a pocket of gas along the left side of the mid/distal esophagus 12/1 KUB- nonobstructive bowel gas, no acute findings  12/1 esophogram: no perforation present 12/1 CT: wall thickening concerning for enteritis, small pocket of gas diminished from prior  GI Surgeries / Procedures:  10/6: esophageal stent placement  10/15 EGD and stent repositioning  11/26 esophageal stent removal  12/2 EGD  Central access: 02/05/24 TPN start date: 01/22/24 - 11/11; 12/2 >>   Goal TPN = 51ml/hr provides 110 g protein and 2400 kcal    RD Assessment:  Estimated Needs Total Energy Estimated Needs: 2300-2500 kcals Total Protein Estimated Needs: 110-130 g Total Fluid Estimated Needs: 2L   Current Nutrition:  TPN + NPO  11/28 pain and vomiting> CT with severe enteritis   Plan:  Start TPN at 65 mL/hr at 1800 to provide 80 gram protein and 1734 kcal meeting ~70% of Kcal/protein requirement (low refeeding risk given previously tolerating tube feeds until 11/30 and received dextrose  containing fluids) Give 20meq K  Electrolytes in TPN: Na 125mEq/L, K 25mEq/L, Ca 33mEq/L, Mg 11mEq/L, and Phos 10mmol/L. Cl:Ac 1:1 Add standard MVI and trace elements to TPN No SSI  Monitor TPN labs  on Mon/Thurs  Avira Tillison S Jeannifer Drakeford 03/19/2024,8:31 AM

## 2024-03-19 NOTE — Anesthesia Preprocedure Evaluation (Addendum)
 Anesthesia Evaluation  Patient identified by MRN, date of birth, ID band Patient awake    Reviewed: Allergy & Precautions, NPO status , Patient's Chart, lab work & pertinent test results  History of Anesthesia Complications (+) PONV and history of anesthetic complications  Airway Mallampati: III  TM Distance: >3 FB Neck ROM: Limited    Dental  (+) Dental Advisory Given, Teeth Intact   Pulmonary neg pulmonary ROS   Pulmonary exam normal        Cardiovascular hypertension, Pt. on medications + Past MI and + DVT  Normal cardiovascular exam+ dysrhythmias Atrial Fibrillation  Rhythm:Regular Rate:Normal  Cardiac arrest 06/2023  IMPRESSIONS     1. Left ventricular ejection fraction, by estimation, is 60 to 65%. The  left ventricle has normal function. The left ventricle has no regional  wall motion abnormalities. There is mild concentric left ventricular  hypertrophy. Left ventricular diastolic  parameters were normal.   2. Right ventricular systolic function is normal. The right ventricular  size is normal.   3. The mitral valve is grossly normal. Trivial mitral valve  regurgitation.   4. The aortic valve has an indeterminant number of cusps. There is  moderate calcification of the aortic valve. Aortic valve regurgitation is  not visualized. Aortic valve sclerosis/calcification is present, without  any evidence of aortic stenosis. Aortic   valve area, by VTI measures 2.29 cm. Aortic valve mean gradient measures  9.0 mmHg.      Neuro/Psych TIA Neuromuscular disease  negative psych ROS   GI/Hepatic Neg liver ROS,,,esophageal rupture with stent placement and subsequent removal on 03/13/24 Peg tube. TF held since 12/1   Endo/Other  negative endocrine ROS    Renal/GU CRFRenal disease     Musculoskeletal negative musculoskeletal ROS (+)    Abdominal   Peds  Hematology  (+) Blood dyscrasia, anemia On eliquis      Anesthesia Other Findings   Reproductive/Obstetrics                              Anesthesia Physical Anesthesia Plan  ASA: 3  Anesthesia Plan: MAC   Post-op Pain Management: Minimal or no pain anticipated   Induction: Intravenous  PONV Risk Score and Plan: 1 and TIVA, Treatment may vary due to age or medical condition and Propofol  infusion  Airway Management Planned: Natural Airway and Simple Face Mask  Additional Equipment: None  Intra-op Plan:   Post-operative Plan: Extubation in OR  Informed Consent: I have reviewed the patients History and Physical, chart, labs and discussed the procedure including the risks, benefits and alternatives for the proposed anesthesia with the patient or authorized representative who has indicated his/her understanding and acceptance.     Dental advisory given  Plan Discussed with: CRNA  Anesthesia Plan Comments:          Anesthesia Quick Evaluation

## 2024-03-19 NOTE — Transfer of Care (Signed)
 Immediate Anesthesia Transfer of Care Note  Patient: Billy [name]  Procedure(s) Performed: EGD (ESOPHAGOGASTRODUODENOSCOPY)  Patient Location: PACU and Endoscopy Unit  Anesthesia Type:MAC  Level of Consciousness: awake, alert , and oriented  Airway & Oxygen  Therapy: Patient Spontanous Breathing and Patient connected to nasal cannula oxygen   Post-op Assessment: Report given to RN and Post -op Vital signs reviewed and stable  Post vital signs: stable  Last Vitals:  Vitals Value Taken Time  BP    Temp    Pulse    Resp    SpO2      Last Pain:  Vitals:   03/19/24 0844  TempSrc: Temporal  PainSc: 0-No pain      Patients Stated Pain Goal: 2 (03/19/24 9357)  Complications: No notable events documented.

## 2024-03-19 NOTE — Anesthesia Procedure Notes (Signed)
 Procedure Name: MAC Date/Time: 03/19/2024 9:25 AM  Performed by: Billy [name], CRNAPre-anesthesia Checklist: Patient identified, Suction available, Emergency Drugs available, Patient being monitored and Timeout performed Patient Re-evaluated:Patient Re-evaluated prior to induction Oxygen  Delivery Method: Simple face mask Preoxygenation: Pre-oxygenation with 100% oxygen  Induction Type: IV induction

## 2024-03-19 NOTE — Progress Notes (Signed)
     [address] [place] [address]             [phone]       Esophagram reviewed No concern for perforation. Does have evidence enteritis which fits with the CT scan. Continue medical management Will remove PEG tube as outpatient Will sign off  Joshus Rogan O Shamarra Warda

## 2024-03-19 NOTE — Op Note (Signed)
 [facility] Patient Name: Billy [name] Procedure Date : 03/19/2024 MRN: [id number] Attending MD: Estelita Manas , MD, [id number] Date of Birth: 08/12/1950 CSN: [id number] Age: 73 Admit Type: Inpatient Procedure:                Upper GI endoscopy Indications:              Upper abdominal pain, recent esophageal perforation                            requiring stent placement and subsequent removal,                            black tarry stool, abnormal imaging, drop in                            hemoglobin Providers:                Estelita Manas, MD, Gregoria Pierce, RN, Janie                            [place], Technician Referring MD:             [facility] Medicines:                Monitored Anesthesia Care Complications:            No immediate complications. Estimated Blood Loss:     Estimated blood loss: none. Procedure:                Pre-Anesthesia Assessment:                           - Prior to the procedure, a History and Physical                            was performed, and patient medications and                            allergies were reviewed. The patient's tolerance of                            previous anesthesia was also reviewed. The risks                            and benefits of the procedure and the sedation                            options and risks were discussed with the patient.                            All questions were answered, and informed consent                            was obtained. Prior Anticoagulants: The patient has                            taken no  anticoagulant or antiplatelet agents. ASA                            Grade Assessment: III - A patient with severe                            systemic disease. After reviewing the risks and                            benefits, the patient was deemed in satisfactory                            condition to undergo the procedure.                           After obtaining  informed consent, the endoscope was                            passed under direct vision. Throughout the                            procedure, the patient's blood pressure, pulse, and                            oxygen  saturations were monitored continuously. The                            [id number]-H190 ([id number]) Olympus endoscope was introduced                            through the mouth, and advanced to the proximal                            jejunum. The upper GI endoscopy was accomplished                            without difficulty. The patient tolerated the                            procedure well. Scope In: Scope Out: Findings:      Localized moderate mucosal changes characterized by congestion,       inflammation, nodularity and altered texture were found in the lower       third of the esophagus.      There was evidence of an intact gastrostomy with a patent G-tube present       in the gastric body.      Patchy severe inflammation was found in the gastric fundus and in the       gastric body.      The cardia and gastric fundus were normal on retroflexion.      Many non-bleeding superficial duodenal ulcers with a clean ulcer base       (Forrest Class III) were found in the second portion of the duodenum, in       the third portion of the duodenum and in the fourth portion of the  duodenum.      Few non-bleeding superficial ulcers with a clean ulcer base (Forrest       Class III) were found in the jejunum.      Findings suggestive of ischemic ulcers. Impression:               - Congested, inflamed, nodular, texture changed                            mucosa in the esophagus.                           - Intact gastrostomy with a patent G-tube present.                           - Gastritis.                           - Non-bleeding duodenal ulcers with a clean ulcer                            base (Forrest Class III).                           - Non-bleeding jejunal ulcers with  a clean ulcer                            base (Forrest Class III).                           - Findings suggestive of ischemic ulcers.                           - No specimens collected. Moderate Sedation:      Patient did not receive moderate sedation for this procedure, but       instead received monitored anesthesia care. Recommendation:           - Full liquid diet.                           - Ok to resume feeding via G tube.                           - Recommend PPI BID indefinitely, recommend                            sucralfate 1 gm QID for 2 months. Procedure Code(s):        --- Professional ---                           [phone], Esophagogastroduodenoscopy, flexible,                            transoral; diagnostic, including collection of                            specimen(s) by brushing or washing, when performed                            (  separate procedure) Diagnosis Code(s):        --- Professional ---                           K20.90, Esophagitis, unspecified without bleeding                           K22.89, Other specified disease of esophagus                           Z93.1, Gastrostomy status                           K29.70, Gastritis, unspecified, without bleeding                           K26.9, Duodenal ulcer, unspecified as acute or                            chronic, without hemorrhage or perforation                           K28.9, Gastrojejunal ulcer, unspecified as acute or                            chronic, without hemorrhage or perforation                           R10.10, Upper abdominal pain, unspecified CPT copyright 2022 [employer]. All rights reserved. The codes documented in this report are preliminary and upon coder review may  be revised to meet current compliance requirements. Estelita Manas, MD 03/19/2024 9:43:14 AM This report has been signed electronically. Number of Addenda: 0

## 2024-03-19 NOTE — Progress Notes (Signed)
 Physical Therapy Treatment Patient Details Name: Billy [name] MRN: [id number] DOB: 1951/01/24 Today's Date: 03/19/2024   History of Present Illness Domnick Chervenak is a 73 y.o. male who presented to [facility] ED 01/21/24 with several days of vomiting followed by an episode of severe tearing epigastric pain. Work-up revealed esophageal tear. Pt s/p esophageal stent with EGD 10/7. Return to OR for esophageal stent adjustment on 10/15, remained intubated after procedure due to suspected aspiration. Extubated 10/16.  Pt had CT placed 10/16. Reintubated 10/20-10/24. G tube placed 11/3. He accidentally pulled out the R chest tube on 11/7.L chest tube removed 11/11. Found to have superficial venous thrombosis of RUE 11/19. N&V with epigastirc pain 11/28, found severe duodenitis on repeat CT. S/P EGD on 12/2.  PMHx: HFrEF, AFib, CAD, PE complicated by cardiogenic shock, CKD 4, MI, peripheral neuropathy, and TIA.    PT Comments  Pt is progressing well towards goals. Currently pt is CGA to supervision for bed mobility, Cga for sit to stand and CGA to supervision for 300 ft of gait with RW. Pt requires Min to Mod UE support on RW for balance and fatigues easily with dyspnea after 200 ft of gait; vital signs stable. Due to pt current functional status, home set up and available assistance at home recommending skilled physical therapy services < 3 hours/day in order to address strength, balance and functional mobility to decrease risk for falls, injury, immobility, skin break down and re-hospitalization.      If plan is discharge home, recommend the following: A little help with walking and/or transfers;A little help with bathing/dressing/bathroom;Assistance with cooking/housework;Direct supervision/assist for medications management;Assist for transportation;Help with stairs or ramp for entrance   Can travel by private vehicle     Yes  Equipment Recommendations  Rolling walker (2 wheels)       Precautions / Restrictions  Precautions Precautions: Fall Recall of Precautions/Restrictions: Impaired Precaution/Restrictions Comments: G-tube Restrictions Weight Bearing Restrictions Per Provider Order: No     Mobility  Bed Mobility Overal bed mobility: Needs Assistance Bed Mobility: Supine to Sit, Sit to Supine     Supine to sit: Contact guard, HOB elevated, Used rails Sit to supine: Supervision   General bed mobility comments: initially pt asking for help with legs to get to supine but was able to perform independently    Transfers Overall transfer level: Needs assistance Equipment used: Rolling walker (2 wheels) Transfers: Sit to/from Stand Sit to Stand: Contact guard assist           General transfer comment: CGA for safety to rise from EOB, with cues for set-up, hand placement and technique.    Ambulation/Gait Ambulation/Gait assistance: Contact guard assist Gait Distance (Feet): 300 Feet Assistive device: Rolling walker (2 wheels) Gait Pattern/deviations: Step-through pattern, Decreased stance time - left, Decreased stride length Gait velocity: decreased Gait velocity interpretation: 1.31 - 2.62 ft/sec, indicative of limited community ambulator   General Gait Details: CGA today, HR/O2 sats WNL. No standing rest breaks required. Pt with shortness of breathe around 200 ft.      Balance Overall balance assessment: Mild deficits observed, not formally tested Sitting-balance support: Feet supported, No upper extremity supported Sitting balance-Leahy Scale: Good Sitting balance - Comments: EOB without support   Standing balance support: Reliant on assistive device for balance, During functional activity, Bilateral upper extremity supported Standing balance-Leahy Scale: Poor Standing balance comment: moderate to light UE assist on RW        Communication Communication Communication: No apparent difficulties  Cognition  Arousal: Alert Behavior During Therapy: WFL for tasks  assessed/performed   PT - Cognitive impairments: No apparent impairments       Following commands: Intact      Cueing Cueing Techniques: Verbal cues     General Comments General comments (skin integrity, edema, etc.): vital signs stable on room air. Pt did get dyspnea after ~ 200 ft of gait with good O2 sats and no significant increase in HR      Pertinent Vitals/Pain Pain Assessment Pain Assessment: Faces Faces Pain Scale: Hurts a little bit Breathing: normal Negative Vocalization: occasional moan/groan, low speech, negative/disapproving quality Facial Expression: smiling or inexpressive Body Language: relaxed Consolability: no need to console PAINAD Score: 1 Pain Location: general Pain Descriptors / Indicators: Discomfort Pain Intervention(s): Monitored during session     PT Goals (current goals can now be found in the care plan section) Acute Rehab PT Goals Patient Stated Goal: To get stronger before I go home PT Goal Formulation: With patient Time For Goal Achievement: 03/19/24 Potential to Achieve Goals: Good Progress towards PT goals: Progressing toward goals    Frequency    Min 2X/week      PT Plan  Continue with current POC        AM-PAC PT 6 Clicks Mobility   Outcome Measure  Help needed turning from your back to your side while in a flat bed without using bedrails?: A Little Help needed moving from lying on your back to sitting on the side of a flat bed without using bedrails?: A Little Help needed moving to and from a bed to a chair (including a wheelchair)?: A Little Help needed standing up from a chair using your arms (e.g., wheelchair or bedside chair)?: A Little Help needed to walk in hospital room?: A Little Help needed climbing 3-5 steps with a railing? : A Little 6 Click Score: 18    End of Session Equipment Utilized During Treatment: Gait belt Activity Tolerance: Patient tolerated treatment well Patient left: in bed;with call  bell/phone within reach Nurse Communication: Mobility status PT Visit Diagnosis: Difficulty in walking, not elsewhere classified (R26.2);Unsteadiness on feet (R26.81);Other abnormalities of gait and mobility (R26.89);Muscle weakness (generalized) (M62.81);Other symptoms and signs involving the nervous system (R29.898);Pain;History of falling (Z91.81)     Time: 8386-8367 PT Time Calculation (min) (ACUTE ONLY): 19 min  Charges:    $Therapeutic Activity: 8-22 mins PT General Charges $$ ACUTE PT VISIT: 1 Visit                     Dorothyann Maier, DPT, CLT  Acute Rehabilitation Services Office: [phone] (Secure chat preferred)    Dorothyann VEAR Maier 03/19/2024, 5:15 PM

## 2024-03-19 NOTE — Progress Notes (Addendum)
 Nutrition Brief Note  Patient repeat esophagram on 12/01 with no evidence of leak s/p esophageal stent removal on 11/26. EGD this morning showed severe gastritis, multiple non-bleeding superficial duodenal and jejunal ischemic ulcers.   Spoke with attending. Plan to re-start TF tomorrow and clear liquid diet the day after pending tolerating re-initiation of tube feedings. Will start low and titrate up while assessing for s/sx of intolerance. Hgb stable.  GI following and CTS has signed off. Will continue TPN short term while titrating up tube feedings. Not medically ready for discharge.   INTERVENTION:  Continue parenteral nutrition while tolerance being assessed  Re-initiate TF via PEG: [place] Standard 1.4 to 65 ml/h (1560 ml per day) Initiate at 49ml/hr and increase by 10ml/hr q6h until goal rate achieved -Prosource TF20 60ml daily - FWF: 100 mL q6h (400 mL/day) -Provides 2264 kcal, 117 gm protein, 1508 ml free water  daily    Re-initiate Banatrol QID -provides 45kcal, 5g soluble fiber per serving Continue Florastor BID for re-establishing gut microbiome  Monitor diet advancement and tolerance and adjust TF regimen, as PO intake allows     NUTRITION DIAGNOSIS:    Severe Malnutrition related to acute illness (may have chronic component due to time frame of reported wt loss) as evidenced by severe fat depletion, moderate muscle depletion. - remains applicable   GOAL:  Patient will meet greater than or equal to 90% of their needs - progressing  Blair Deaner MS, RD, LDN Registered Dietitian Clinical Nutrition RD Inpatient Contact Info in Amion

## 2024-03-19 NOTE — Telephone Encounter (Signed)
 Called to confirm/remind patient of their appointment [facility] on 03/20/24.   Appointment:   [] Confirmed  [x] Left mess   [] No answer/No voice mail  [] VM Full/unable to leave message  [] Phone not in service  And  to bring in all medications and/or complete list.

## 2024-03-19 NOTE — Interval H&P Note (Signed)
 History and Physical Interval Note: 73/male with recent esophageal perforation, stent placement and subsequent removal of 03/13/24 with feeding tube in place, has severe abdominal pain and black tarry stools, is here for EGD with propofol .  03/19/2024 8:59 AM  Seena Galt  has presented today for EGD with propofol , with the diagnosis of Melena, duodenitis, severe abdominal pain, recent esophageal rupture with stent placement and subsequent removal on 03/13/24.  The various methods of treatment have been discussed with the patient and family. After consideration of risks, benefits and other options for treatment, the patient has consented to  Procedure(s): EGD (ESOPHAGOGASTRODUODENOSCOPY) (N/A) as a surgical intervention.  The patient's history has been reviewed, patient examined, no change in status, stable for surgery.  I have reviewed the patient's chart and labs.  Questions were answered to the patient's satisfaction.     Estelita Manas

## 2024-03-19 NOTE — Progress Notes (Signed)
 Pt was having dark, tarry, loose stools; pt's BP 199/93 MAP 121. Janese Cleaver, MD; see new orders.  Lonell LITTIE Lyme, RN

## 2024-03-20 ENCOUNTER — Ambulatory Visit (HOSPITAL_COMMUNITY)

## 2024-03-20 ENCOUNTER — Encounter (HOSPITAL_COMMUNITY): Payer: Self-pay | Admitting: Gastroenterology

## 2024-03-20 ENCOUNTER — Ambulatory Visit: Payer: Self-pay

## 2024-03-20 LAB — COMPREHENSIVE METABOLIC PANEL WITH GFR
ALT: 82 U/L — ABNORMAL HIGH (ref 0–44)
AST: 41 U/L (ref 15–41)
Albumin: <1.5 g/dL — ABNORMAL LOW (ref 3.5–5.0)
Alkaline Phosphatase: 54 U/L (ref 38–126)
Anion gap: 10 (ref 5–15)
BUN: 24 mg/dL — ABNORMAL HIGH (ref 8–23)
CO2: 27 mmol/L (ref 22–32)
Calcium: 7.9 mg/dL — ABNORMAL LOW (ref 8.9–10.3)
Chloride: 104 mmol/L (ref 98–111)
Creatinine, Ser: 1.6 mg/dL — ABNORMAL HIGH (ref 0.61–1.24)
GFR, Estimated: 46 mL/min — ABNORMAL LOW (ref >60)
Glucose, Bld: 140 mg/dL — ABNORMAL HIGH (ref 70–99)
Potassium: 3.5 mmol/L (ref 3.5–5.1)
Sodium: 141 mmol/L (ref 135–145)
Total Bilirubin: 0.5 mg/dL (ref 0.0–1.2)
Total Protein: 4 g/dL — ABNORMAL LOW (ref 6.5–8.1)

## 2024-03-20 LAB — CBC
HCT: 28.3 % — ABNORMAL LOW (ref 39.0–52.0)
HCT: 29.2 % — ABNORMAL LOW (ref 39.0–52.0)
Hemoglobin: 8.8 g/dL — ABNORMAL LOW (ref 13.0–17.0)
Hemoglobin: 9.1 g/dL — ABNORMAL LOW (ref 13.0–17.0)
MCH: 27.4 pg (ref 26.0–34.0)
MCH: 28.4 pg (ref 26.0–34.0)
MCHC: 31.1 g/dL (ref 30.0–36.0)
MCHC: 31.2 g/dL (ref 30.0–36.0)
MCV: 88.2 fL (ref 80.0–100.0)
MCV: 91.3 fL (ref 80.0–100.0)
Platelets: 186 K/uL (ref 150–400)
Platelets: 212 K/uL (ref 150–400)
RBC: 3.21 MIL/uL — ABNORMAL LOW (ref 4.22–5.81)
RBC: 3.2 MIL/uL — ABNORMAL LOW (ref 4.22–5.81)
RDW: 16.6 % — ABNORMAL HIGH (ref 11.5–15.5)
RDW: 17.1 % — ABNORMAL HIGH (ref 11.5–15.5)
WBC: 10.5 K/uL (ref 4.0–10.5)
WBC: 11 K/uL — ABNORMAL HIGH (ref 4.0–10.5)
nRBC: 0 % (ref 0.0–0.2)
nRBC: 0 % (ref 0.0–0.2)

## 2024-03-20 LAB — MAGNESIUM: Magnesium: 2.1 mg/dL (ref 1.7–2.4)

## 2024-03-20 LAB — PHOSPHORUS: Phosphorus: 2.1 mg/dL — ABNORMAL LOW (ref 2.5–4.6)

## 2024-03-20 LAB — GLUCOSE, CAPILLARY
Glucose-Capillary: 139 mg/dL — ABNORMAL HIGH (ref 70–99)
Glucose-Capillary: 151 mg/dL — ABNORMAL HIGH (ref 70–99)

## 2024-03-20 MED ORDER — TRAVASOL 10 % IV SOLN
INTRAVENOUS | Status: AC
  Filled 2024-03-20: qty 1101.6

## 2024-03-20 MED ORDER — FUROSEMIDE 10 MG/ML IJ SOLN
40.0000 mg | Freq: Every day | INTRAMUSCULAR | Status: DC
  Administered 2024-03-20 – 2024-03-21 (×2): 40 mg via INTRAVENOUS
  Filled 2024-03-20 (×2): qty 4

## 2024-03-20 MED ORDER — POTASSIUM CHLORIDE 10 MEQ/100ML IV SOLN
10.0000 meq | Freq: Once | INTRAVENOUS | Status: AC
  Administered 2024-03-20: 10 meq via INTRAVENOUS

## 2024-03-20 MED ORDER — POTASSIUM PHOSPHATES 15 MMOLE/5ML IV SOLN
15.0000 mmol | Freq: Once | INTRAVENOUS | Status: AC
  Administered 2024-03-20: 15 mmol via INTRAVENOUS
  Filled 2024-03-20: qty 5

## 2024-03-20 MED ORDER — TRAVASOL 10 % IV SOLN: INTRAVENOUS | Status: DC

## 2024-03-20 MED ORDER — POTASSIUM CHLORIDE 10 MEQ/100ML IV SOLN
10.0000 meq | INTRAVENOUS | Status: AC
  Administered 2024-03-20: 10 meq via INTRAVENOUS
  Filled 2024-03-20 (×2): qty 100

## 2024-03-20 NOTE — Progress Notes (Signed)
 Occupational Therapy Treatment Patient Details Name: Billy [name] MRN: [id number] DOB: 1950-12-19 Today's Date: 03/20/2024   History of present illness Billy [name] is a 73 y.o. male who presented to [facility] ED 01/21/24 with several days of vomiting followed by an episode of severe tearing epigastric pain. Work-up revealed esophageal tear. Pt s/p esophageal stent with EGD 10/7. Return to OR for esophageal stent adjustment on 10/15, remained intubated after procedure due to suspected aspiration. Extubated 10/16.  Pt had CT placed 10/16. Reintubated 10/20-10/24. G tube placed 11/3. He accidentally pulled out the R chest tube on 11/7.L chest tube removed 11/11. Found to have superficial venous thrombosis of RUE 11/19. N&V with epigastirc pain 11/28, found severe duodenitis on repeat CT. S/P EGD on 12/2.  PMHx: HFrEF, AFib, CAD, PE complicated by cardiogenic shock, CKD 4, MI, peripheral neuropathy, and TIA.   OT comments  Pt progressing toward goals, eager to mobilize. Pt needing min-max A overall for ADLs, supervision-CGA for bed mobility and CGA for transfers with RW. Pt needs mod cues for activity pacing, and to breathe throughout. Pt walking quickly to get back to room and had x1 LOB needing min A to correct. Pt presenting with impairments listed below, will follow acutely. Patient will benefit from continued inpatient follow up therapy, <3 hours/day to maximize safety/ind with ADL/functional mobility.       If plan is discharge home, recommend the following:  Assistance with cooking/housework;Direct supervision/assist for medications management;Direct supervision/assist for financial management;Assist for transportation;Help with stairs or ramp for entrance;A lot of help with bathing/dressing/bathroom;A lot of help with walking and/or transfers   Equipment Recommendations  Other (comment) (defer)    Recommendations for Other Services Rehab consult    Precautions / Restrictions Precautions Precautions:  Fall Recall of Precautions/Restrictions: Impaired Precaution/Restrictions Comments: G-tube Restrictions Weight Bearing Restrictions Per Provider Order: No       Mobility Bed Mobility Overal bed mobility: Needs Assistance Bed Mobility: Supine to Sit, Sit to Supine     Supine to sit: Contact guard, HOB elevated Sit to supine: Supervision        Transfers Overall transfer level: Needs assistance Equipment used: Rolling walker (2 wheels) Transfers: Sit to/from Stand Sit to Stand: Contact guard assist           General transfer comment: cues for pacing     Balance   Sitting-balance support: Feet supported, No upper extremity supported Sitting balance-Leahy Scale: Good Sitting balance - Comments: EOB without support   Standing balance support: Reliant on assistive device for balance, During functional activity, Bilateral upper extremity supported Standing balance-Leahy Scale: Poor Standing balance comment: moderate to light UE assist on RW                           ADL either performed or assessed with clinical judgement   ADL Overall ADL's : Needs assistance/impaired                 Upper Body Dressing : Minimal assistance;Standing       Toilet Transfer: Minimal assistance;Stand-pivot;Rolling walker (2 wheels);BSC/3in1   Toileting- Clothing Manipulation and Hygiene: Maximal assistance Toileting - Clothing Manipulation Details (indicate cue type and reason): standing pericare     Functional mobility during ADLs: Minimal assistance;Rolling walker (2 wheels)      Extremity/Trunk Assessment Upper Extremity Assessment Upper Extremity Assessment: Overall WFL for tasks assessed   Lower Extremity Assessment Lower Extremity Assessment: Defer to PT evaluation  Vision   Vision Assessment?: No apparent visual deficits   Perception Perception Perception: Not tested   Praxis Praxis Praxis: Not tested   Communication  Communication Communication: No apparent difficulties Factors Affecting Communication: Hearing impaired   Cognition Arousal: Alert Behavior During Therapy: Anxious Cognition: Cognition impaired Difficult to assess due to: Impaired communication           OT - Cognition Comments: anxious, needs motivation to attempt BADL/mobility tasks                 Following commands: Intact Following commands impaired: Follows multi-step commands inconsistently      Cueing   Cueing Techniques: Verbal cues  Exercises      Shoulder Instructions       General Comments VSS on RA    Pertinent Vitals/ Pain       Pain Assessment Pain Assessment: Faces Pain Score: 3  Faces Pain Scale: Hurts little more Pain Location: neck Pain Descriptors / Indicators: Discomfort Pain Intervention(s): Limited activity within patient's tolerance, Monitored during session, Repositioned  Home Living                                          Prior Functioning/Environment              Frequency  Min 2X/week        Progress Toward Goals  OT Goals(current goals can now be found in the care plan section)  Progress towards OT goals: Progressing toward goals  Acute Rehab OT Goals OT Goal Formulation: With patient Time For Goal Achievement: 04/01/24 Potential to Achieve Goals: Fair ADL Goals Pt Will Perform Grooming: with supervision;standing Pt Will Perform Lower Body Bathing: with supervision;sit to/from stand Pt Will Perform Upper Body Dressing: with contact guard assist;sitting Pt Will Perform Lower Body Dressing: with contact guard assist;sitting/lateral leans;sit to/from stand;with adaptive equipment Pt Will Transfer to Toilet: with contact guard assist;ambulating;regular height toilet Pt Will Perform Toileting - Clothing Manipulation and hygiene: sitting/lateral leans;sit to/from stand;with supervision Pt/caregiver will Perform Home Exercise Program: Increased  ROM;Increased strength;Both right and left upper extremity;With Supervision Additional ADL Goal #1: pt will tolerate standing functional activity x10 min in prep for OOB ADLs Additional ADL Goal #2: Pt to increase standing tolerance > 8 min during ADLs/mobility without need for seated rest break  Plan      Co-evaluation                 AM-PAC OT 6 Clicks Daily Activity     Outcome Measure   Help from another person eating meals?: A Little Help from another person taking care of personal grooming?: A Little Help from another person toileting, which includes using toliet, bedpan, or urinal?: A Lot Help from another person bathing (including washing, rinsing, drying)?: A Lot Help from another person to put on and taking off regular upper body clothing?: A Little Help from another person to put on and taking off regular lower body clothing?: A Lot 6 Click Score: 15    End of Session Equipment Utilized During Treatment: Gait belt;Rolling walker (2 wheels)  OT Visit Diagnosis: Muscle weakness (generalized) (M62.81);Other symptoms and signs involving cognitive function;Unsteadiness on feet (R26.81)   Activity Tolerance Patient tolerated treatment well   Patient Left in bed;with call bell/phone within reach;with bed alarm set   Nurse Communication Mobility status (RN resumed tube feeds)  Time: 8548-8486 OT Time Calculation (min): 22 min  Charges: OT General Charges $OT Visit: 1 Visit OT Treatments $Therapeutic Activity: 8-22 mins  Daymien Goth K, OTD, OTR/L SecureChat Preferred Acute Rehab [phone]   [name] 03/20/2024, 4:08 PM

## 2024-03-20 NOTE — Progress Notes (Addendum)
 Subjective: Patient states abdominal pain has improved.  Objective: Vital signs in last 24 hours: Temp:  [97.5 F (36.4 C)-98.7 F (37.1 C)] 98.1 F (36.7 C) (12/03 0742) Pulse Rate:  [68-87] 73 (12/03 0742) Resp:  [14-19] 15 (12/03 0742) BP: (159-190)/(69-99) 159/69 (12/03 0742) SpO2:  [93 %-99 %] 96 % (12/03 0742) Weight:  [82.6 kg-85.5 kg] 85.5 kg (12/03 0300) Weight change: 0 kg Last BM Date : 03/19/24  PE: Not in distress GENERAL: Mild pallor ABDOMEN: G-tube in place, abdomen nontender, normoactive bowel sounds EXTREMITIES: No deformity  Lab Results: Results for orders placed or performed during the hospital encounter of 01/21/24 (from the past 48 hours)  CBC     Status: Abnormal   Collection Time: 03/18/24 11:34 AM  Result Value Ref Range   WBC 11.5 (H) 4.0 - 10.5 K/uL   RBC 3.69 (L) 4.22 - 5.81 MIL/uL   Hemoglobin 10.3 (L) 13.0 - 17.0 g/dL   HCT 66.9 (L) 60.9 - 47.9 %   MCV 89.4 80.0 - 100.0 fL   MCH 27.9 26.0 - 34.0 pg   MCHC 31.2 30.0 - 36.0 g/dL   RDW 83.2 (H) 88.4 - 84.4 %   Platelets 194 150 - 400 K/uL   nRBC 0.0 0.0 - 0.2 %    Comment: Performed at [facility], [address] [place], [place] [address]  Glucose, capillary     Status: Abnormal   Collection Time: 03/18/24 11:42 AM  Result Value Ref Range   Glucose-Capillary 130 (H) 70 - 99 mg/dL    Comment: Glucose reference range applies only to samples taken after fasting for at least 8 hours.  Type and screen [facility]     Status: None   Collection Time: 03/18/24 12:38 PM  Result Value Ref Range   ABO/RH(D) A POS    Antibody Screen NEG    Sample Expiration      03/21/2024,2359 Performed at [facility], [address] [place], [place] [address]   Glucose, capillary     Status: Abnormal   Collection Time: 03/18/24  1:38 PM  Result Value Ref Range   Glucose-Capillary 147 (H) 70 - 99 mg/dL    Comment: Glucose reference range applies only to samples taken after  fasting for at least 8 hours.  Glucose, capillary     Status: Abnormal   Collection Time: 03/18/24  5:02 PM  Result Value Ref Range   Glucose-Capillary 146 (H) 70 - 99 mg/dL    Comment: Glucose reference range applies only to samples taken after fasting for at least 8 hours.  Glucose, capillary     Status: Abnormal   Collection Time: 03/18/24  8:13 PM  Result Value Ref Range   Glucose-Capillary 140 (H) 70 - 99 mg/dL    Comment: Glucose reference range applies only to samples taken after fasting for at least 8 hours.  CBC     Status: Abnormal   Collection Time: 03/18/24  8:38 PM  Result Value Ref Range   WBC 13.8 (H) 4.0 - 10.5 K/uL   RBC 4.28 4.22 - 5.81 MIL/uL   Hemoglobin 11.8 (L) 13.0 - 17.0 g/dL   HCT 62.6 (L) 60.9 - 47.9 %   MCV 87.1 80.0 - 100.0 fL   MCH 27.6 26.0 - 34.0 pg   MCHC 31.6 30.0 - 36.0 g/dL   RDW 83.3 (H) 88.4 - 84.4 %   Platelets 214 150 - 400 K/uL   nRBC 0.1  0.0 - 0.2 %    Comment: Performed at [facility], [address] [place], [place] [address]  Occult blood card to lab, stool     Status: Abnormal   Collection Time: 03/18/24 10:58 PM  Result Value Ref Range   Fecal Occult Bld POSITIVE (A) NEGATIVE    Comment: Performed at [facility], [address], [place], [place] [address]  Glucose, capillary     Status: Abnormal   Collection Time: 03/19/24 12:00 AM  Result Value Ref Range   Glucose-Capillary 152 (H) 70 - 99 mg/dL    Comment: Glucose reference range applies only to samples taken after fasting for at least 8 hours.  CBC     Status: Abnormal   Collection Time: 03/19/24 12:05 AM  Result Value Ref Range   WBC 11.5 (H) 4.0 - 10.5 K/uL   RBC 3.79 (L) 4.22 - 5.81 MIL/uL   Hemoglobin 10.5 (L) 13.0 - 17.0 g/dL   HCT 66.8 (L) 60.9 - 47.9 %   MCV 87.3 80.0 - 100.0 fL   MCH 27.7 26.0 - 34.0 pg   MCHC 31.7 30.0 - 36.0 g/dL   RDW 83.3 (H) 88.4 - 84.4 %   Platelets 198 150 - 400 K/uL   nRBC 0.0 0.0 - 0.2 %    Comment: Performed at [facility], [address] [place], [place] [address]  Glucose, capillary     Status: Abnormal   Collection Time: 03/19/24  4:09 AM  Result Value Ref Range   Glucose-Capillary 150 (H) 70 - 99 mg/dL    Comment: Glucose reference range applies only to samples taken after fasting for at least 8 hours.  CBC     Status: Abnormal   Collection Time: 03/19/24  4:20 AM  Result Value Ref Range   WBC 11.2 (H) 4.0 - 10.5 K/uL   RBC 3.46 (L) 4.22 - 5.81 MIL/uL   Hemoglobin 9.5 (L) 13.0 - 17.0 g/dL   HCT 69.4 (L) 60.9 - 47.9 %   MCV 88.2 80.0 - 100.0 fL   MCH 27.5 26.0 - 34.0 pg   MCHC 31.1 30.0 - 36.0 g/dL   RDW 83.2 (H) 88.4 - 84.4 %   Platelets 183 150 - 400 K/uL   nRBC 0.0 0.0 - 0.2 %    Comment: Performed at [facility], [address] [place], [place] [address]  Basic metabolic panel     Status: Abnormal   Collection Time: 03/19/24  4:20 AM  Result Value Ref Range   Sodium 137 135 - 145 mmol/L   Potassium 3.4 (L) 3.5 - 5.1 mmol/L   Chloride 104 98 - 111 mmol/L   CO2 22 22 - 32 mmol/L   Glucose, Bld 547 (HH) 70 - 99 mg/dL    Comment: CRITICAL RESULT CALLED TO, READ BACK BY AND VERIFIED WITH C. GREESON, RN AT [phone] 12.02.25 JLASIGAN DELTA CHECK NOTED, OKAY TO RELEASE PER RN Glucose reference range applies only to samples taken after fasting for at least 8 hours.    BUN 25 (H) 8 - 23 mg/dL   Creatinine, Ser 8.34 (H) 0.61 - 1.24 mg/dL   Calcium  7.4 (L) 8.9 - 10.3 mg/dL   GFR, Estimated 44 (L) >60 mL/min    Comment: (NOTE) Calculated using the CKD-EPI Creatinine Equation (2021)    Anion gap 11 5 - 15    Comment: Performed at [facility], [address] [place],   [address]  Basic metabolic panel with GFR     Status: Abnormal   Collection Time: 03/19/24  6:13 AM  Result Value Ref Range   Sodium 138 135 - 145 mmol/L   Potassium 3.5 3.5 - 5.1 mmol/L   Chloride 108 98 - 111 mmol/L   CO2 23 22 - 32 mmol/L   Glucose, Bld 150 (H) 70 - 99 mg/dL    Comment: Glucose  reference range applies only to samples taken after fasting for at least 8 hours.   BUN 27 (H) 8 - 23 mg/dL   Creatinine, Ser 8.41 (H) 0.61 - 1.24 mg/dL   Calcium  7.9 (L) 8.9 - 10.3 mg/dL   GFR, Estimated 46 (L) >60 mL/min    Comment: (NOTE) Calculated using the CKD-EPI Creatinine Equation (2021)    Anion gap 7 5 - 15    Comment: Performed at [facility], [address] [place], [place] [address]  Phosphorus     Status: None   Collection Time: 03/19/24  6:13 AM  Result Value Ref Range   Phosphorus 3.0 2.5 - 4.6 mg/dL    Comment: Performed at [facility], [address], [place], [place] [address]  Magnesium      Status: None   Collection Time: 03/19/24  6:13 AM  Result Value Ref Range   Magnesium  2.0 1.7 - 2.4 mg/dL    Comment: Performed at [facility], [address], [place], [place] [address]  Glucose, capillary     Status: Abnormal   Collection Time: 03/19/24  8:08 AM  Result Value Ref Range   Glucose-Capillary 130 (H) 70 - 99 mg/dL    Comment: Glucose reference range applies only to samples taken after fasting for at least 8 hours.  Glucose, capillary     Status: Abnormal   Collection Time: 03/19/24 12:14 PM  Result Value Ref Range   Glucose-Capillary 115 (H) 70 - 99 mg/dL    Comment: Glucose reference range applies only to samples taken after fasting for at least 8 hours.  Glucose, capillary     Status: Abnormal   Collection Time: 03/19/24  4:48 PM  Result Value Ref Range   Glucose-Capillary 133 (H) 70 - 99 mg/dL    Comment: Glucose reference range applies only to samples taken after fasting for at least 8 hours.  CBC     Status: Abnormal   Collection Time: 03/19/24  5:22 PM  Result Value Ref Range   WBC 11.9 (H) 4.0 - 10.5 K/uL   RBC 3.45 (L) 4.22 - 5.81 MIL/uL   Hemoglobin 9.5 (L) 13.0 - 17.0 g/dL   HCT 69.7 (L) 60.9 - 47.9 %   MCV 87.5 80.0 - 100.0 fL   MCH 27.5 26.0 - 34.0 pg   MCHC 31.5 30.0 - 36.0 g/dL   RDW 83.2 (H) 88.4 - 84.4 %    Platelets 191 150 - 400 K/uL   nRBC 0.0 0.0 - 0.2 %    Comment: Performed at [facility], [address], [place], [place] [address]  Glucose, capillary     Status: Abnormal   Collection Time: 03/19/24  8:12 PM  Result Value Ref Range   Glucose-Capillary 141 (H) 70 - 99 mg/dL    Comment: Glucose reference range applies only to samples taken after fasting for at least 8 hours.  Glucose, capillary     Status: Abnormal   Collection Time: 03/20/24 12:33 AM  Result Value Ref Range  Glucose-Capillary 151 (H) 70 - 99 mg/dL    Comment: Glucose reference range applies only to samples taken after fasting for at least 8 hours.  Glucose, capillary     Status: Abnormal   Collection Time: 03/20/24  4:25 AM  Result Value Ref Range   Glucose-Capillary 139 (H) 70 - 99 mg/dL    Comment: Glucose reference range applies only to samples taken after fasting for at least 8 hours.  CBC     Status: Abnormal   Collection Time: 03/20/24  5:00 AM  Result Value Ref Range   WBC 10.5 4.0 - 10.5 K/uL   RBC 3.21 (L) 4.22 - 5.81 MIL/uL   Hemoglobin 8.8 (L) 13.0 - 17.0 g/dL   HCT 71.6 (L) 60.9 - 47.9 %   MCV 88.2 80.0 - 100.0 fL   MCH 27.4 26.0 - 34.0 pg   MCHC 31.1 30.0 - 36.0 g/dL   RDW 83.3 (H) 88.4 - 84.4 %   Platelets 186 150 - 400 K/uL   nRBC 0.0 0.0 - 0.2 %    Comment: Performed at [facility], [address], [place], [place] [address]  Comprehensive metabolic panel     Status: Abnormal   Collection Time: 03/20/24  5:00 AM  Result Value Ref Range   Sodium 141 135 - 145 mmol/L   Potassium 3.5 3.5 - 5.1 mmol/L   Chloride 104 98 - 111 mmol/L   CO2 27 22 - 32 mmol/L   Glucose, Bld 140 (H) 70 - 99 mg/dL    Comment: Glucose reference range applies only to samples taken after fasting for at least 8 hours.   BUN 24 (H) 8 - 23 mg/dL   Creatinine, Ser 8.39 (H) 0.61 - 1.24 mg/dL   Calcium  7.9 (L) 8.9 - 10.3 mg/dL   Total Protein 4.0 (L) 6.5 - 8.1 g/dL   Albumin  <1.5 (L) 3.5 - 5.0 g/dL   AST 41  15 - 41 U/L   ALT 82 (H) 0 - 44 U/L   Alkaline Phosphatase 54 38 - 126 U/L   Total Bilirubin 0.5 0.0 - 1.2 mg/dL   GFR, Estimated 46 (L) >60 mL/min    Comment: (NOTE) Calculated using the CKD-EPI Creatinine Equation (2021)    Anion gap 10 5 - 15    Comment: Performed at [facility], [address], [place], [place] [address]  Magnesium      Status: None   Collection Time: 03/20/24  5:00 AM  Result Value Ref Range   Magnesium  2.1 1.7 - 2.4 mg/dL    Comment: Performed at [facility], [address] [place], [place] [address]    Studies/Results: CT ABDOMEN PELVIS W CONTRAST Result Date: 03/18/2024 CLINICAL DATA:  Provided history: Abdominal pain, acute (Ped 0-17y) EXAM: CT ABDOMEN AND PELVIS WITH CONTRAST TECHNIQUE: Multidetector CT imaging of the abdomen and pelvis was performed using the standard protocol following bolus administration of intravenous contrast. RADIATION DOSE REDUCTION: This exam was performed according to the departmental dose-optimization program which includes automated exposure control, adjustment of the mA and/or kV according to patient size and/or use of iterative reconstruction technique. CONTRAST:  75mL OMNIPAQUE  IOHEXOL  350 MG/ML SOLN COMPARISON:  CT 3 days ago 03/15/2024 FINDINGS: Lower chest: Small bilateral pleural effusions, minimally diminished from recent prior. Chronic lung disease in the bases. Please reference recent chest CT for complete thoracic assessment. Pocket of gas adjacent to the left side of the mid/distal esophagus is only partially included in  the field of view, possibly diminished from recent prior. Hepatobiliary: No focal liver abnormality. Unremarkable appearance of the gallbladder. No biliary dilatation. Pancreas: No evidence of pancreatic inflammation. Assessment of the pancreatic head is again limited due to adjacent duodenal wall thickening. Spleen: Normal in size without focal abnormality. Adrenals/Urinary Tract: Normal adrenal  glands. Chronic left renal atrophy. No hydronephrosis or focal renal abnormality of the right kidney. Unremarkable appearance of the urinary bladder. Stomach/Bowel: Gastrostomy tube in the stomach. Wall thickening of the distal esophagus which contains a small amount of enteric contrast. The stomach is physiologically distended. There is severe circumferential wall thickening about the transverse duodenum and proximal jejunum. No obstruction with enteric contrast reaching the colon. No new areas of small bowel wall thickening. The appendix is normal. No colonic inflammatory change. No bowel pneumatosis. Vascular/Lymphatic: Aortic atherosclerosis. Infrarenal IVC filter in place. Small upper abdominal lymph nodes are not enlarged by size criteria. Reproductive: Prostate is unremarkable. Other: Edema in the upper abdomen adjacent to proximal small bowel wall thickening. Small amount of free fluid adjacent to the liver, increased. Small amount of free fluid in the right pericolic gutter tracking into the pelvis. No free air or focal fluid collection. Patulous right inguinal canal. Generalized subcutaneous edema. Musculoskeletal: Stable degenerative change in the spine. IMPRESSION: 1. Persistent severe circumferential wall thickening about the duodenum and proximal jejunum, suspicious for enteritis. No obstruction with enteric contrast reaching the colon. 2. Small amount of free fluid in the abdomen and pelvis, increased over the last 3 days. 3. Small bilateral pleural effusions, minimally diminished. 4. Contrast in the distal esophagus can be seen in the setting of reflux. The small pocket of gas adjacent to the distal esophagus is only partially included in the field of view, possibly diminished from recent prior. Aortic Atherosclerosis (ICD10-I70.0). Electronically Signed   By: Andrea Gasman M.D.   On: 03/18/2024 20:26   DG UGI W SINGLE CM (SOL OR THIN BA) Result Date: 03/18/2024 CLINICAL DATA:  73 year old male  with history of esophageal perforation. Status post stent placement and removal. He presents for water -soluble UGI due to abdominal pain and emesis. CT on 03/15/24 demonstrated wall thickening involving the duodenum and proximal jejunum. EXAM: DG UGI W SINGLE CM TECHNIQUE: Scout radiograph was obtained. Single contrast examination was performed using 200 mL of water -soluble contrast. 150 mL of the water -soluble contrast was given orally, 50 mL of the water -soluble contrast was given via the G-tube. The G tube was flushed with 10 mL of water  at the end of the exam. This exam was performed by Toya Cousin, PA-C, and was supervised and interpreted by [name] Balder, MD. FLUOROSCOPY: Radiation Exposure Index (as provided by the fluoroscopic device): 39.5 mGy Kerma COMPARISON:  CT chest abdomen without contrast on 03/15/2024, water -soluble esophagram on 02/09/2024, water  soluble esophagram on 01/31/2024, water -soluble esophagus on 01/23/2024 FINDINGS: Scout Radiograph: Scout image demonstrates an IVC filter and gastrostomy tube. Nonobstructive bowel gas pattern with gas in the colon. Central line tip in the SVC. Esophagus: Esophagus is patent with mild narrowing and irregularity in the mid esophagus. No evidence for an esophageal perforation. Mild dilatation of the distal esophagus with small amount of debris or filling defects in the distal esophagus near the GE junction. Esophageal motility: Esophageal motility was not evaluated formally. Proximal escape seen. Gastroesophageal reflux:  Not examined. Ingested 13mm barium tablet:  Not given. Stomach: Gastrostomy tube in place. Normal appearance of the stomach. Gastric emptying: Normal. Duodenum: Circumferential narrowing in the distal duodenum and  proximal jejunum. Findings correspond with recent CT findings. Other:  Mildly dilated loops of proximal jejunum. IMPRESSION: 1. Esophagus is patent and no evidence for a perforation. 2. Mild irregularity in the mid esophagus possibly  related to previous stent. 3. Diffuse narrowing of the lumen in the distal duodenum and proximal jejunum. Findings are suggestive for underlying wall thickening in this area. Distribution of disease is similar to the prior CT from 03/15/2024. Electronically Signed   By: [name] Balder M.D.   On: 03/18/2024 16:52    Medications: I have reviewed the patient's current medications.  Assessment: EGD 03/19/2024: Multiple duodenal and proximal jejunal ulcers consistent with ischemic ulcers Distal esophageal mucosa appeared erythematous, changes compatible with recent stent placement and subsequent removal  Lab abnormalities: Anemia, hemoglobin 8.8 Severe malnutrition, albumin  less than 1.5, total protein 4 ALT elevated at 82 BUN 24, creatinine 1.6, GFR 46  Comorbidities: History of cardiac arrest, congestive heart failure, reduced EF, A-fib, PE, recent esophageal perforation/rupture treated with stent placement and subsequent removal pneumonia  Plan: Will start on full liquid diet today and plan to advance to mechanical soft diet as tolerated in the next 3-5 days.  Patient to resume tube feeding via PEG, 1560 mL/day along with Prosource TF 2060 mL/day.  If patient able to tolerate p.o. and tube feedings, okay to discontinue IV TPN.  Recommend continuing PPI such as pantoprazole  twice a day indefinitely and sucralfate 1 g 4 times a day for 2 months.  As per CTVS, G tube to be removed as outpatient when patient able to maintain adequate nutrition with PO intake.  GI will sign off, please recall if needed.    Billy Manas, MD 03/20/2024, 8:11 AM

## 2024-03-20 NOTE — Progress Notes (Signed)
 PHARMACY - TOTAL PARENTERAL NUTRITION CONSULT NOTE   Indication: intolerance to enteral feedings, esophageal perforation  Patient Measurements: Height: 6' 1 (185.4 cm) Weight: 85.5 kg (188 lb 7.9 oz) IBW/kg (Calculated) : 79.9 TPN AdjBW (KG): 82.6 Body mass index is 24.87 kg/m.  Assessment:  73 year old man with medical history significant for cardiac arrest earlier this year who was admitted after several days of nausea and vomiting followed by sudden onset severe tearing epigastric pain radiating to back and chest. 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 Pharmacy consulted to initiate TPN due to inability to tolerate enteral nutrition at this time.  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 PTA takes torsemide  20 mg PO daily, now held. Aggressive fluid resuscitation recommended per GI. Per HF, patient currently euvolemic but will hold diuretic for now. Due to CHF will target lower end of fluid range for TPN and supplement additional fluids per MD outside of TPN to prevent fluid overload.   Stent placed on 10/6, esophogram not showing any leak. Diet advanced to CLD. 10/15 Leak present and made NPO. 10/24 esophagram with persistent leak; will need stent replacement in 3-4 weeks.   12/1 re-consulted for TPN due to maroon output from PEG, N/V and concern for enteritis/duodenitis.   Glucose / Insulin : A1C 6.0% (02/2023), BG< 150, no SSI, previously did not need insulin  when on TPN  Electrolytes:  K 3.5, phos 2.1,  other lytes wnl Renal: SCr 1.6 (BL ~2), BUN 24 Hepatic:  last alk phos/AST/ALT/tbili wnl 11/27, alb 1.6, TG wnl 11/10 Intake / Output; MIVF: I/O + 1.5L, emesis x 1 (unmeasured) 12/1, LBM 12/2    GI Imaging: 10/5 CT: concerning for esophageal perforation, masslike area of soft tissue thickening adjacent to and surrounding the esophagus  10/6 Esoph XR: distal esophageal perforation 10/7 Esoph XR: No leak associated with stent  coverage of esophageal perforation 10/14 KUB: Gas-filled loops of small bowel and colon suggestive of ileus 10/15 Esoph XR: esophageal perforation/leak at the distal esophagus, proximal to the esophageal stent 10/24 esophagram: persistent leak despite being covered by the esophageal stent.  11/25 KUB: normal gas pattern 11/28 KUB: no acute findings  11/28 CT: concerning for severe enteritis.  Again noted is a pocket of gas along the left side of the mid/distal esophagus 12/1 KUB- nonobstructive bowel gas, no acute findings  12/1 esophogram: no perforation present 12/1 CT: wall thickening concerning for enteritis, small pocket of gas diminished from prior  GI Surgeries / Procedures:  10/6: esophageal stent placement  10/15 EGD and stent repositioning  11/26 esophageal stent removal  12/2 EGD  Central access: 02/05/24 TPN start date: 01/22/24 - 11/11; 12/2 >>   Goal TPN = 17ml/hr provides 110 g protein and 2400 kcal    RD Assessment:  Estimated Needs Total Energy Estimated Needs: 2300-2500 kcals Total Protein Estimated Needs: 110-130 g Total Fluid Estimated Needs: 2L   Current Nutrition:  TPN + NPO  12/3 re-started TF via PEG (goal 53mL/h, due to start this morning - not started yet) 12/3 started full liquid diet > plans to advance to mechanical soft next 3-5 days  Plan:  Increase TPN at 90 mL/hr at 1800 to provide ~100% of Kcal/protein requirement Give 20meq K  Give 15mmol Kphos Electrolytes in TPN: Na 125mEq/L, increase K 30mEq/L, Ca 71mEq/L, Mg 71mEq/L, and increase Phos 74mmol/L. Cl:Ac 1:1 Add standard MVI and trace elements to TPN No SSI  Monitor TPN labs on Mon/Thurs F/u if  tolerating TF/diet may be able to stop TPN 12/4  Ho Parisi S Edgar Corrigan 03/20/2024,7:19 AM

## 2024-03-20 NOTE — Progress Notes (Signed)
 Mobility Specialist Progress Note:    03/20/24 1200  Mobility  Activity Refused and notified nurse if applicable   Attempted to see patient x3. At attempt 1 pt requested MS to return at a later time stating he just ate and didn't want to get nauseated during ambulation. Second attempt pt was having a BM requested MS to come back. Third attempt pt got frustrated and stated it was not a good time, pt had meal tray in front of them. Will f/u as able.   Thersia Minder Mobility Specialist  Please contact vis Secure Chat or  Rehab Office [phone]

## 2024-03-20 NOTE — TOC Progression Note (Signed)
 Transition of Care [facility]) - Progression Note    Patient Details  Name: Huie Ghuman MRN: [id number] Date of Birth: 1950-08-27  Transition of Care [facility]) CM/SW Contact  Lauraine FORBES Saa, LCSWA Phone Number: 03/20/2024, 11:42 AM  Clinical Narrative:     11:42 AM CSW to submit patient's SNF insurance authorization for [facility] when patient is medically stable for discharge. CSW will continue to follow.  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, SNF Pending bed offer               Expected Discharge Plan and Services In-house Referral: Clinical Social Work Discharge Planning Services: [employer] Consult Post Acute Care Choice: Skilled Nursing Facility Living arrangements for the past 2 months: Single Family Home                                       Social Drivers of Health (SDOH) Interventions SDOH Screenings   Food Insecurity: No Food Insecurity (01/22/2024)  Housing: High Risk (01/22/2024)  Transportation Needs: No Transportation Needs (01/22/2024)  Utilities: Not At Risk (01/22/2024)  Depression (PHQ2-9): Low Risk  (12/01/2020)  Social Connections: Socially Isolated (01/22/2024)  Tobacco Use: Low Risk  (03/19/2024)    Readmission Risk Interventions    06/27/2023    1:52 PM 10/14/2022   11:29 AM  Readmission Risk Prevention Plan  Post Dischage Appt  Complete  Medication Screening  Complete  Transportation Screening Complete Complete  HRI or Home Care Consult Complete   Social Work Consult for Recovery Care Planning/Counseling Complete   Palliative Care Screening Not Applicable   Medication Review Oceanographer) Referral to Pharmacy

## 2024-03-20 NOTE — Plan of Care (Signed)
  Problem: Education: Goal: Knowledge of General Education information will improve Description: Including pain rating scale, medication(s)/side effects and non-pharmacologic comfort measures Outcome: Progressing   Problem: Health Behavior/Discharge Planning: Goal: Ability to manage health-related needs will improve Outcome: Progressing   Problem: Clinical Measurements: Goal: Will remain free from infection Outcome: Progressing Goal: Diagnostic test results will improve Outcome: Progressing Goal: Respiratory complications will improve Outcome: Progressing Goal: Cardiovascular complication will be avoided Outcome: Progressing   Problem: Activity: Goal: Risk for activity intolerance will decrease Outcome: Progressing   Problem: Nutrition: Goal: Adequate nutrition will be maintained Outcome: Progressing   Problem: Coping: Goal: Level of anxiety will decrease Outcome: Progressing   Problem: Elimination: Goal: Will not experience complications related to bowel motility Outcome: Progressing Goal: Will not experience complications related to urinary retention Outcome: Progressing   Problem: Pain Managment: Goal: General experience of comfort will improve and/or be controlled Outcome: Progressing   Problem: Safety: Goal: Ability to remain free from injury will improve Outcome: Progressing   Problem: Skin Integrity: Goal: Risk for impaired skin integrity will decrease Outcome: Progressing   Problem: Education: Goal: Ability to describe self-care measures that may prevent or decrease complications (Diabetes Survival Skills Education) will improve Outcome: Progressing   Problem: Coping: Goal: Ability to adjust to condition or change in health will improve Outcome: Progressing   Problem: Fluid Volume: Goal: Ability to maintain a balanced intake and output will improve Outcome: Progressing   Problem: Health Behavior/Discharge Planning: Goal: Ability to identify and  utilize available resources and services will improve Outcome: Progressing Goal: Ability to manage health-related needs will improve Outcome: Progressing   Problem: Metabolic: Goal: Ability to maintain appropriate glucose levels will improve Outcome: Progressing   Problem: Nutritional: Goal: Maintenance of adequate nutrition will improve Outcome: Progressing Goal: Progress toward achieving an optimal weight will improve Outcome: Progressing   Problem: Skin Integrity: Goal: Risk for impaired skin integrity will decrease Outcome: Progressing   Problem: Tissue Perfusion: Goal: Adequacy of tissue perfusion will improve Outcome: Progressing   Problem: Activity: Goal: Ability to tolerate increased activity will improve Outcome: Progressing   Problem: Respiratory: Goal: Ability to maintain a clear airway and adequate ventilation will improve Outcome: Progressing   Problem: Role Relationship: Goal: Method of communication will improve Outcome: Progressing   Problem: Education: Goal: Ability to describe self-care measures that may prevent or decrease complications (Diabetes Survival Skills Education) will improve Outcome: Progressing Goal: Individualized Educational Video(s) Outcome: Progressing   Problem: Coping: Goal: Ability to adjust to condition or change in health will improve Outcome: Progressing   Problem: Fluid Volume: Goal: Ability to maintain a balanced intake and output will improve Outcome: Progressing   Problem: Health Behavior/Discharge Planning: Goal: Ability to identify and utilize available resources and services will improve Outcome: Progressing Goal: Ability to manage health-related needs will improve Outcome: Progressing   Problem: Metabolic: Goal: Ability to maintain appropriate glucose levels will improve Outcome: Progressing   Problem: Nutritional: Goal: Maintenance of adequate nutrition will improve Outcome: Progressing Goal: Progress  toward achieving an optimal weight will improve Outcome: Progressing   Problem: Skin Integrity: Goal: Risk for impaired skin integrity will decrease Outcome: Progressing   Problem: Tissue Perfusion: Goal: Adequacy of tissue perfusion will improve Outcome: Progressing

## 2024-03-20 NOTE — Progress Notes (Signed)
 MD notified that pts abd was becoming destined. MD stated to change the rate of the tube feed to 22mL/hr for 12 hours. If pt is able to tolerate then change rate to 17mL/hr and have RD reassess pt in the morning. If pt starts to have abd pain get a KUB and update MD. MD also ordered CBG's Q6.

## 2024-03-20 NOTE — Progress Notes (Signed)
 PROGRESS NOTE    Billy [name]  FMW:[id number] DOB: January 04, 1951 DOA: 01/21/2024 PCP: Seabron Lenis, MD  Subjective: No new subjective & objective note has been filed under this hospital service since the last note was generated.    Hospital Course: 73 year old with history of cardiac arrest earlier this year, congestive heart failure with reduced EF, A-fib, PE presenting with nausea vomiting found to have esophageal rupture/tear requiring stent placement 01/23/2024.  Swallow study revealed additional leak and had stent repositioning 01/31/2024. 10/15 stent repositioned. There was concerns of gastric content aspiration therefore mechanically ventilated in the ICU.  Bedside ultrasound revealed right-sided pleural effusion requiring chest tube placement with removal of 300 cc of exudative fluid. He was also successfully extubated on 10/16. Patient has had increased oxygen  requirement requiring reintubation on 10/20. 10/24 barium swallow showed persistent leak. Eventually, patient was extubated and transferred out of ICU on 10/28. 11/3 PEG tube placed. 11/19 swelling of right arm showing on ultrasound SVT cephalic vein.   Hospital course complicated by acute hypoxic respiratory, PNA, acute on chronic HFpEF for which he is on antibiotics and IV diuresis per advanced heart failure team. PEG tube placed on 11/3.  Dietitian consulted for initiation of tube feed. Taken off TPN as was tolerating tube feed at goal.   11/25 PEG tube placement x-ray shows no extravasation. 11/27 torsemide  resumed for upper extremity edema. 11/28 increased epigastric pain and N/V.  Severe duodenitis on repeat CT.  GI re-consulted.  Made NPO+ice, tube feeds stopped and G-tube placed to gravity drain. 11/29 N/V improved. Still epigastric pain but improved.  Re-trial clear liquids with G tube off drain with TF running.12/1 developed severe epigastric pain with n/v, significant output from G tube initially just tube feeds then  coffee-ground color. Also developed melena. Underwent EGD with severe worsened gastroenteritis and multiple ischemic ulcers. 12/3-Started on full liquid diet today with plans to advance to mech soft diet as tolerated in next 3-5 days per GI. Resumed on TF today, if he tolerated TF and PO, can discontinue TPN.   Assessment and Plan:  Boerhaave syndrome  Esophageal rupture - events as above - last esophagram 10/24 showed persistent leak. -11/26 esophageal stent removal with no evidence of extravasation on esophagram in OR - Status post esophageal stent removal on 11/26 -repeat esophagogram 12/1 with no leak - CT surgery signed off 12/2  - cont PPI IV BID - increase sucralfate TID to QID - cont IV dilaudid  0.5mg  q2h PRN - plan to remove PEG tube outpatient - will need CT surgery follow up outpatient   Severe gastroenteritis Ischemic ulcer disease - repeat CT A/P 12/1 showed persistent severe circumferential thickening of duodenum, and proximal jejunum, small area of free fluid in abdomen and pelvis, small bilateral pleural effusions, postsurgical esophagus findings - continues to have severe epigastric pain with nausea and intermittent vomiting  - EGD 12/2 with severe gastritis, multiple non-bleeding superficial duodenal and jejural ischemic ulcers - IV fluids while unable to tolerate tube feeds, take PO, or off TPN - Continue IV Zosyn , duration TBD  - cont PPI IV BID - cont sucralfate - cont maalox PRN - cont phenergan  IV PRN - plan to restart tube feeds on 12/3 and monitor for tolerance -Started on full liquid diet today with plans to advance to mech soft diet as tolerated in next 3-5 days per GI. Resumed on TF today, if he tolerated TF and PO, can discontinue TPN. - GI signed off 12/3 - dietician following   Anemia  Coffee Ground output from PEG Melena - Related to severe gastroenteritis and peptic ulcer disease - Hgb 9.5 < 10.5, baseline 8-12 - hold eliquis  for now - cont PPI  IV BID and sucralfate as elsewhere - repeat type and screen - transfuse for Hgb goal > 7 - monitor CBC   Lung nodules  - seen on recent chest CT - likely secondary to aspiration /effusion per CT Surgery -- pain control as elsewhere - monitor clinically    Upper back pain Musculoskeletal in nature imaging done as above 11/22 nonspecific-overall improved --Continue Flexeril  5 3 times daily as needed --Mobilize    Bilateral UE swelling -Worse at right upper extremity. -Doppler US  ruled out DVT, acute superficial thrombosis of cephalic veins noted -Warm compress and Arm elevation --Slightly improved with Lasix  --hold home torsemide  given concerns of dehydration with ongoing PEG output --Elevate arms in bed   Superficial venous thrombosis  - noted as above --Monitor --Hold Eliquis  given for now.  --Resume no signs/symptoms of acute bleed and Hgb stable in the next 48-72 hours - monitor CBC    RUE and b/l thigh ecchymosis and petechiae Initially Noted around PICC site, originally placed 02/05/24 No warmth, tenderness, discharge, induration, fluctuance Discussed with IV team. Might be a CHG/secure port reaction from not letting it dry and placing the dressing.  Low concern for acute infection, Platelets are within normal limits Cont local skin care as per IV team recs Monitor clinically   Acute hypoxic respiratory failure  Pneumonia Pleural effusion Acute CHF exacerbation Respiratory failure multifactorial in the setting of pneumonia, pleural effusion, CHF exacerbation.  - Improved with Lasix  and antibiotics. - Continue Xopenex  inhalers  - Mucinex   --hold home torsemide  given concerns of dehydration with ongoing PEG output   Paroxysmal A-fib  - DCCV 07/06/2023 - Amiodarone  200mg  daily.  Plan to taper off as able as converted to NSR and depending on next steps with surgery? --metoprolol  as needed IV for heart rate above 110   History of PE with DVTs  - with IVC filter  placed 3/25 - hold Eliquis  5 for now. Resume no signs/symptoms of acute bleed and Hgb stable in the next 48-72 hours - monitor CBC  - monitor CBC   Hypertension Hyperlipidemia - maybe related to intractable pain with nausea and vomiting - cont IV labetalol  and IV hydralazine  PRN - Hydralazine  100 mg Q8H - atorvastatin  80  - Zetia  10  - Isordil  10 twice daily - metoprolol  12.5 mg BID  - losartan  25 daily  - hold home torsemide  given concerns of dehydration with significant PEG output --Monitor BP and renal function   Bipolar disorder  - overall stable - Appears not to be on medications.   Insomnia --Continue trazodone     Protein energy malnutrition BMI 21 --Supplements and vitamins -On PO diet, TF and TPN - f/u  Dietitian for recommendations    CKD stage 3B -- stable  11/30 - Cr stable & improved 1.97 >> 1.51 >> 1.58 --Monitor   Metabolic acidosis - resolved --Monitor labs   Multiple bedsores and wounds anterior right and left foot and injury stage I to bottom --Continue Gerhardt twice daily  --Lidoderm  patch for back pain --Schedule Lomotil  1 tablet BID and QID PRN --Diarrhea overall has improved   History of Cardiac arrest 06/2023   DVT prophylaxis: Place TED hose Start: 02/27/24 1903 SCDs Start: 01/21/24 2023  SCDs   Code Status: Full Code  Disposition Plan: SNF Reason for continuing need for  hospitalization: severity of illness  Subjective-Seen at bedside, this morning, he reports bilateral upper and lower extremity swelling and he is also concerned about petechial rash in his bilateral thighs. RN states that rash is new from the last time she saw him 12/27.denies fevers chills URI symptoms.  Objective: Vitals:   03/19/24 2200 03/20/24 0100 03/20/24 0300 03/20/24 0742  BP: (!) 174/77 (!) 164/71 (!) 162/71 (!) 159/69  Pulse: 87  68 73  Resp: 18  17 15   Temp: 98.7 F (37.1 C)  98.5 F (36.9 C) 98.1 F (36.7 C)  TempSrc: Oral  Oral Oral  SpO2: 97%   95% 96%  Weight:   85.5 kg   Height:        Intake/Output Summary (Last 24 hours) at 03/20/2024 0848 Last data filed at 03/20/2024 0500 Gross per 24 hour  Intake 1632.55 ml  Output 650 ml  Net 982.55 ml   Filed Weights   03/19/24 0341 03/19/24 0844 03/20/24 0300  Weight: 82.6 kg 82.6 kg 85.5 kg    Examination:  Physical Exam Vitals and nursing note reviewed.  Constitutional:      General: He is not in acute distress.    Appearance: He is ill-appearing.     Comments: Weak, frail  HENT:     Head: Normocephalic and atraumatic.  Cardiovascular:     Rate and Rhythm: Normal rate and regular rhythm.     Pulses: Normal pulses.     Heart sounds: Normal heart sounds.  Pulmonary:     Effort: Pulmonary effort is normal.     Breath sounds: Normal breath sounds.  Abdominal:     General: Bowel sounds are normal.     Palpations: Abdomen is soft.  Neurological:     Mental Status: He is alert.     Data Reviewed: I have personally reviewed following labs and imaging studies  CBC: Recent Labs  Lab 03/14/24 0435 03/15/24 0354 03/16/24 0830 03/18/24 2038 03/19/24 0005 03/19/24 0420 03/19/24 1722 03/20/24 0500  WBC 10.4 10.4   < > 13.8* 11.5* 11.2* 11.9* 10.5  NEUTROABS 9.3* 8.8*  --   --   --   --   --   --   HGB 9.8* 10.5*   < > 11.8* 10.5* 9.5* 9.5* 8.8*  HCT 31.7* 33.7*   < > 37.3* 33.1* 30.5* 30.2* 28.3*  MCV 90.1 89.4   < > 87.1 87.3 88.2 87.5 88.2  PLT 220 215   < > 214 198 183 191 186   < > = values in this interval not displayed.   Basic Metabolic Panel: Recent Labs  Lab 03/16/24 0830 03/17/24 0530 03/18/24 0420 03/19/24 0420 03/19/24 0613 03/20/24 0500  NA 139 137 136 137 138 141  K 4.0 3.8 3.5 3.4* 3.5 3.5  CL 106 105 104 104 108 104  CO2 23 23 25 22 23 27   GLUCOSE 107* 123* 132* 547* 150* 140*  BUN 30* 29* 32* 25* 27* 24*  CREATININE 1.51* 1.58* 1.73* 1.65* 1.58* 1.60*  CALCIUM  8.1* 7.9* 7.6* 7.4* 7.9* 7.9*  MG 2.0  --   --   --  2.0 2.1  PHOS 2.4*  2.2* 2.8  --  3.0  --    GFR: Estimated Creatinine Clearance: 47.9 mL/min (A) (by C-G formula based on SCr of 1.6 mg/dL (H)). Liver Function Tests: Recent Labs  Lab 03/14/24 0435 03/20/24 0500  AST 38 41  ALT 75* 82*  ALKPHOS 67 54  BILITOT 0.2 0.5  PROT 4.8* 4.0*  ALBUMIN  1.6* <1.5*   No results for input(s): LIPASE, AMYLASE in the last 168 hours. No results for input(s): AMMONIA in the last 168 hours. Coagulation Profile: No results for input(s): INR, PROTIME in the last 168 hours. Cardiac Enzymes: No results for input(s): CKTOTAL, CKMB, CKMBINDEX, TROPONINI in the last 168 hours. ProBNP, BNP (last 5 results) Recent Labs    06/27/23 1116  BNP 646.4*   HbA1C: No results for input(s): HGBA1C in the last 72 hours. CBG: Recent Labs  Lab 03/19/24 1214 03/19/24 1648 03/19/24 2012 03/20/24 0033 03/20/24 0425  GLUCAP 115* 133* 141* 151* 139*   Lipid Profile: No results for input(s): CHOL, HDL, LDLCALC, TRIG, CHOLHDL, LDLDIRECT in the last 72 hours. Thyroid  Function Tests: No results for input(s): TSH, T4TOTAL, FREET4, T3FREE, THYROIDAB in the last 72 hours. Anemia Panel: No results for input(s): VITAMINB12, FOLATE, FERRITIN, TIBC, IRON , RETICCTPCT in the last 72 hours. Sepsis Labs: No results for input(s): PROCALCITON, LATICACIDVEN in the last 168 hours.  Recent Results (from the past 240 hours)  Surgical pcr screen     Status: Abnormal   Collection Time: 03/12/24  4:05 PM   Specimen: Nasal Mucosa; Nasal Swab  Result Value Ref Range Status   MRSA, PCR POSITIVE (A) NEGATIVE Final    Comment: RESULT CALLED TO, READ BACK BY AND VERIFIED WITH: RN T. LILIAN [phone] @2023  FH    Staphylococcus aureus POSITIVE (A) NEGATIVE Final    Comment: (NOTE) The Xpert SA Assay (FDA approved for NASAL specimens in patients 71 years of age and older), is one component of a comprehensive surveillance program. It is not  intended to diagnose infection nor to guide or monitor treatment. Performed at [facility], [address] [place], [place] [address]      Radiology Studies: CT ABDOMEN PELVIS W CONTRAST Result Date: 03/18/2024 CLINICAL DATA:  Provided history: Abdominal pain, acute (Ped 0-17y) EXAM: CT ABDOMEN AND PELVIS WITH CONTRAST TECHNIQUE: Multidetector CT imaging of the abdomen and pelvis was performed using the standard protocol following bolus administration of intravenous contrast. RADIATION DOSE REDUCTION: This exam was performed according to the departmental dose-optimization program which includes automated exposure control, adjustment of the mA and/or kV according to patient size and/or use of iterative reconstruction technique. CONTRAST:  75mL OMNIPAQUE  IOHEXOL  350 MG/ML SOLN COMPARISON:  CT 3 days ago 03/15/2024 FINDINGS: Lower chest: Small bilateral pleural effusions, minimally diminished from recent prior. Chronic lung disease in the bases. Please reference recent chest CT for complete thoracic assessment. Pocket of gas adjacent to the left side of the mid/distal esophagus is only partially included in the field of view, possibly diminished from recent prior. Hepatobiliary: No focal liver abnormality. Unremarkable appearance of the gallbladder. No biliary dilatation. Pancreas: No evidence of pancreatic inflammation. Assessment of the pancreatic head is again limited due to adjacent duodenal wall thickening. Spleen: Normal in size without focal abnormality. Adrenals/Urinary Tract: Normal adrenal glands. Chronic left renal atrophy. No hydronephrosis or focal renal abnormality of the right kidney. Unremarkable appearance of the urinary bladder. Stomach/Bowel: Gastrostomy tube in the stomach. Wall thickening of the distal esophagus which contains a small amount of enteric contrast. The stomach is physiologically distended. There is severe circumferential wall thickening about the transverse duodenum  and proximal jejunum. No obstruction with enteric contrast reaching the colon. No new areas of small bowel wall thickening. The appendix is normal. No colonic inflammatory change. No bowel pneumatosis. Vascular/Lymphatic: Aortic atherosclerosis. Infrarenal IVC filter in place.  Small upper abdominal lymph nodes are not enlarged by size criteria. Reproductive: Prostate is unremarkable. Other: Edema in the upper abdomen adjacent to proximal small bowel wall thickening. Small amount of free fluid adjacent to the liver, increased. Small amount of free fluid in the right pericolic gutter tracking into the pelvis. No free air or focal fluid collection. Patulous right inguinal canal. Generalized subcutaneous edema. Musculoskeletal: Stable degenerative change in the spine. IMPRESSION: 1. Persistent severe circumferential wall thickening about the duodenum and proximal jejunum, suspicious for enteritis. No obstruction with enteric contrast reaching the colon. 2. Small amount of free fluid in the abdomen and pelvis, increased over the last 3 days. 3. Small bilateral pleural effusions, minimally diminished. 4. Contrast in the distal esophagus can be seen in the setting of reflux. The small pocket of gas adjacent to the distal esophagus is only partially included in the field of view, possibly diminished from recent prior. Aortic Atherosclerosis (ICD10-I70.0). Electronically Signed   By: Andrea Gasman M.D.   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 Status post stent placement and removal. He presents for water -soluble UGI due to abdominal pain and emesis. CT on 03/15/24 demonstrated wall thickening involving the duodenum and proximal jejunum. EXAM: DG UGI W SINGLE CM TECHNIQUE: Scout radiograph was obtained. Single contrast examination was performed using 200 mL of water -soluble contrast. 150 mL of the  water -soluble contrast was given orally, 50 mL of the water -soluble contrast was given via the G-tube. The G tube was flushed with 10 mL of water  at the end of the exam. This exam was performed by Toya Cousin, PA-C, and was supervised and interpreted by [name] Balder, MD. FLUOROSCOPY: Radiation Exposure Index (as provided by the fluoroscopic device): 39.5 mGy Kerma COMPARISON:  CT chest abdomen without contrast on 03/15/2024, water -soluble esophagram on 02/09/2024, water  soluble esophagram on 01/31/2024, water -soluble esophagus on 01/23/2024 FINDINGS: Scout Radiograph: Scout image demonstrates an IVC filter and gastrostomy tube. Nonobstructive bowel gas pattern with gas in the colon. Central line tip in the SVC. Esophagus: Esophagus is patent with mild narrowing and irregularity in the mid esophagus. No evidence for an esophageal perforation. Mild dilatation of the distal esophagus with small amount of debris or filling defects in the distal esophagus near the GE junction. Esophageal motility: Esophageal motility was not evaluated formally. Proximal escape seen. Gastroesophageal reflux:  Not examined. Ingested 13mm barium tablet:  Not given. Stomach: Gastrostomy tube in place. Normal appearance of the stomach. Gastric emptying: Normal. Duodenum: Circumferential narrowing in the distal duodenum and proximal jejunum. Findings correspond with recent CT findings. Other:  Mildly dilated loops of proximal jejunum. IMPRESSION: 1. Esophagus is patent and no evidence for a perforation. 2. Mild irregularity in the mid esophagus possibly related to previous stent. 3. Diffuse narrowing of the lumen in the distal duodenum and proximal jejunum. Findings are suggestive for underlying wall thickening in this area. Distribution of disease is similar to the prior CT from 03/15/2024. Electronically Signed   By: [name] Balder M.D.   On: 03/18/2024 16:52    Scheduled Meds:  amiodarone   200 mg Per Tube Daily   atorvastatin   80 mg Per Tube  Daily   Chlorhexidine  Gluconate Cloth  6 each Topical Daily   diphenoxylate -atropine   1 tablet Per Tube BID   ezetimibe   10 mg Per Tube Daily   feeding supplement (KATE FARMS STANDARD ENT 1.4)  1,000 mL Per Tube Q24H   [name] butt cream   Topical BID   hydrALAZINE   100 mg Per Tube Q8H   isosorbide  dinitrate  10 mg Per Tube BID   lidocaine   1 patch Transdermal Q24H   losartan   25 mg Per Tube Daily   metoprolol  tartrate  12.5 mg Per Tube BID   pantoprazole  (PROTONIX ) IV  40 mg Intravenous Q12H   saccharomyces boulardii  250 mg Per Tube BID   sodium chloride  flush  10-40 mL Intracatheter Q12H   sucralfate  1 g Per Tube Q6H   Continuous Infusions:  dextrose  5% lactated ringers  Stopped (03/19/24 1950)   piperacillin -tazobactam (ZOSYN )  IV 3.375 g (03/20/24 9385)   potassium chloride      promethazine  (PHENERGAN ) injection (IM or IVPB) Stopped (03/18/24 0911)   TPN ADULT (ION) 65 mL/hr at 03/20/24 0500     LOS: 59 days   Landon FORBES Baller, MD  [facility]  03/20/2024, 8:48 AM

## 2024-03-21 ENCOUNTER — Inpatient Hospital Stay (HOSPITAL_COMMUNITY)

## 2024-03-21 DIAGNOSIS — K223 Perforation of esophagus: Secondary | ICD-10-CM | POA: Diagnosis not present

## 2024-03-21 LAB — CBC
HCT: 28.9 % — ABNORMAL LOW (ref 39.0–52.0)
HCT: 29.5 % — ABNORMAL LOW (ref 39.0–52.0)
Hemoglobin: 9 g/dL — ABNORMAL LOW (ref 13.0–17.0)
Hemoglobin: 9.3 g/dL — ABNORMAL LOW (ref 13.0–17.0)
MCH: 27.6 pg (ref 26.0–34.0)
MCH: 27.4 pg (ref 26.0–34.0)
MCHC: 31.1 g/dL (ref 30.0–36.0)
MCHC: 31.5 g/dL (ref 30.0–36.0)
MCV: 88.7 fL (ref 80.0–100.0)
MCV: 87 fL (ref 80.0–100.0)
Platelets: 202 K/uL (ref 150–400)
Platelets: 194 K/uL (ref 150–400)
RBC: 3.26 MIL/uL — ABNORMAL LOW (ref 4.22–5.81)
RBC: 3.39 MIL/uL — ABNORMAL LOW (ref 4.22–5.81)
RDW: 17 % — ABNORMAL HIGH (ref 11.5–15.5)
RDW: 16.7 % — ABNORMAL HIGH (ref 11.5–15.5)
WBC: 11.8 K/uL — ABNORMAL HIGH (ref 4.0–10.5)
WBC: 19 K/uL — ABNORMAL HIGH (ref 4.0–10.5)
nRBC: 0 % (ref 0.0–0.2)
nRBC: 0 % (ref 0.0–0.2)

## 2024-03-21 LAB — COMPREHENSIVE METABOLIC PANEL WITH GFR
ALT: 68 U/L — ABNORMAL HIGH (ref 0–44)
AST: 31 U/L (ref 15–41)
Albumin: <1.5 g/dL — ABNORMAL LOW (ref 3.5–5.0)
Alkaline Phosphatase: 63 U/L (ref 38–126)
Anion gap: 8 (ref 5–15)
BUN: 39 mg/dL — ABNORMAL HIGH (ref 8–23)
CO2: 27 mmol/L (ref 22–32)
Calcium: 7.9 mg/dL — ABNORMAL LOW (ref 8.9–10.3)
Chloride: 107 mmol/L (ref 98–111)
Creatinine, Ser: 1.93 mg/dL — ABNORMAL HIGH (ref 0.61–1.24)
GFR, Estimated: 37 mL/min — ABNORMAL LOW (ref >60)
Glucose, Bld: 146 mg/dL — ABNORMAL HIGH (ref 70–99)
Potassium: 4.2 mmol/L (ref 3.5–5.1)
Sodium: 142 mmol/L (ref 135–145)
Total Bilirubin: 0.5 mg/dL (ref 0.0–1.2)
Total Protein: 4 g/dL — ABNORMAL LOW (ref 6.5–8.1)

## 2024-03-21 LAB — GLUCOSE, CAPILLARY
Glucose-Capillary: 109 mg/dL — ABNORMAL HIGH (ref 70–99)
Glucose-Capillary: 138 mg/dL — ABNORMAL HIGH (ref 70–99)
Glucose-Capillary: 140 mg/dL — ABNORMAL HIGH (ref 70–99)
Glucose-Capillary: 150 mg/dL — ABNORMAL HIGH (ref 70–99)
Glucose-Capillary: 152 mg/dL — ABNORMAL HIGH (ref 70–99)

## 2024-03-21 LAB — MAGNESIUM: Magnesium: 2.3 mg/dL (ref 1.7–2.4)

## 2024-03-21 LAB — PHOSPHORUS: Phosphorus: 2.2 mg/dL — ABNORMAL LOW (ref 2.5–4.6)

## 2024-03-21 MED ORDER — FUROSEMIDE 10 MG/ML IJ SOLN
120.0000 mg | INTRAVENOUS | Status: AC
  Administered 2024-03-21: 120 mg via INTRAVENOUS
  Filled 2024-03-21: qty 2

## 2024-03-21 MED ORDER — SODIUM CHLORIDE 0.9 % IV SOLN
3.0000 g | Freq: Three times a day (TID) | INTRAVENOUS | Status: DC
  Administered 2024-03-21 – 2024-03-23 (×6): 3 g via INTRAVENOUS
  Filled 2024-03-21 (×6): qty 8

## 2024-03-21 MED ORDER — TORSEMIDE 20 MG PO TABS
60.0000 mg | ORAL_TABLET | Freq: Every day | ORAL | Status: DC
  Administered 2024-03-22: 60 mg via ORAL
  Filled 2024-03-21: qty 3

## 2024-03-21 MED ORDER — NITROGLYCERIN IN D5W 200-5 MCG/ML-% IV SOLN
30.0000 ug/min | INTRAVENOUS | Status: DC
  Administered 2024-03-21: 20 ug/min via INTRAVENOUS
  Filled 2024-03-21: qty 250

## 2024-03-21 MED ORDER — GUAIFENESIN 100 MG/5ML PO LIQD
10.0000 mL | ORAL | Status: DC
  Administered 2024-03-22 – 2024-04-28 (×189): 10 mL
  Filled 2024-03-21: qty 10
  Filled 2024-03-21: qty 15
  Filled 2024-03-21: qty 10
  Filled 2024-03-21: qty 15
  Filled 2024-03-21 (×2): qty 10
  Filled 2024-03-21: qty 15
  Filled 2024-03-21 (×6): qty 10
  Filled 2024-03-21: qty 15
  Filled 2024-03-21 (×4): qty 10
  Filled 2024-03-21 (×2): qty 15
  Filled 2024-03-21 (×2): qty 10
  Filled 2024-03-21: qty 15
  Filled 2024-03-21 (×3): qty 10
  Filled 2024-03-21: qty 15
  Filled 2024-03-21 (×3): qty 10
  Filled 2024-03-21: qty 15
  Filled 2024-03-21 (×5): qty 10
  Filled 2024-03-21: qty 15
  Filled 2024-03-21: qty 10
  Filled 2024-03-21: qty 15
  Filled 2024-03-21 (×2): qty 10
  Filled 2024-03-21: qty 15
  Filled 2024-03-21: qty 10
  Filled 2024-03-21 (×2): qty 15
  Filled 2024-03-21 (×4): qty 10
  Filled 2024-03-21: qty 15
  Filled 2024-03-21: qty 10
  Filled 2024-03-21 (×2): qty 15
  Filled 2024-03-21: qty 10
  Filled 2024-03-21: qty 15
  Filled 2024-03-21 (×2): qty 10
  Filled 2024-03-21: qty 15
  Filled 2024-03-21: qty 10
  Filled 2024-03-21: qty 15
  Filled 2024-03-21: qty 10
  Filled 2024-03-21: qty 15
  Filled 2024-03-21: qty 10
  Filled 2024-03-21: qty 15
  Filled 2024-03-21 (×5): qty 10
  Filled 2024-03-21: qty 15
  Filled 2024-03-21: qty 10
  Filled 2024-03-21: qty 15
  Filled 2024-03-21: qty 10
  Filled 2024-03-21 (×2): qty 15
  Filled 2024-03-21 (×3): qty 10
  Filled 2024-03-21: qty 15
  Filled 2024-03-21 (×4): qty 10
  Filled 2024-03-21 (×3): qty 15
  Filled 2024-03-21 (×2): qty 10
  Filled 2024-03-21: qty 15
  Filled 2024-03-21 (×2): qty 10
  Filled 2024-03-21 (×2): qty 15
  Filled 2024-03-21: qty 10
  Filled 2024-03-21 (×3): qty 15
  Filled 2024-03-21: qty 10
  Filled 2024-03-21 (×3): qty 15
  Filled 2024-03-21 (×2): qty 10
  Filled 2024-03-21: qty 15
  Filled 2024-03-21: qty 10
  Filled 2024-03-21: qty 15
  Filled 2024-03-21 (×4): qty 10
  Filled 2024-03-21: qty 15
  Filled 2024-03-21 (×2): qty 10
  Filled 2024-03-21: qty 15
  Filled 2024-03-21 (×4): qty 10
  Filled 2024-03-21: qty 15
  Filled 2024-03-21: qty 10
  Filled 2024-03-21 (×2): qty 15
  Filled 2024-03-21: qty 10
  Filled 2024-03-21: qty 15
  Filled 2024-03-21 (×2): qty 10
  Filled 2024-03-21: qty 15
  Filled 2024-03-21 (×3): qty 10
  Filled 2024-03-21 (×2): qty 15
  Filled 2024-03-21: qty 10
  Filled 2024-03-21 (×3): qty 15
  Filled 2024-03-21: qty 10
  Filled 2024-03-21: qty 15
  Filled 2024-03-21: qty 10
  Filled 2024-03-21: qty 15
  Filled 2024-03-21: qty 10
  Filled 2024-03-21: qty 15
  Filled 2024-03-21 (×2): qty 10
  Filled 2024-03-21 (×2): qty 15
  Filled 2024-03-21 (×5): qty 10
  Filled 2024-03-21: qty 15
  Filled 2024-03-21: qty 10
  Filled 2024-03-21: qty 15
  Filled 2024-03-21 (×2): qty 10
  Filled 2024-03-21: qty 15
  Filled 2024-03-21 (×4): qty 10
  Filled 2024-03-21: qty 15
  Filled 2024-03-21: qty 10

## 2024-03-21 MED ORDER — ORAL CARE MOUTH RINSE
15.0000 mL | OROMUCOSAL | Status: DC
  Administered 2024-03-22 – 2024-03-27 (×12): 15 mL via OROMUCOSAL

## 2024-03-21 MED ORDER — GUAIFENESIN ER 600 MG PO TB12
600.0000 mg | ORAL_TABLET | Freq: Two times a day (BID) | ORAL | Status: DC
  Administered 2024-03-21: 600 mg via ORAL
  Filled 2024-03-21 (×2): qty 1

## 2024-03-21 MED ORDER — ORAL CARE MOUTH RINSE: 15.0000 mL | OROMUCOSAL | Status: DC | PRN

## 2024-03-21 MED ORDER — ENSURE PLUS HIGH PROTEIN PO LIQD
237.0000 mL | Freq: Two times a day (BID) | ORAL | Status: DC
  Administered 2024-03-21 – 2024-03-26 (×10): 237 mL via ORAL

## 2024-03-21 NOTE — Progress Notes (Signed)
 PROGRESS NOTE    Billy [name]  FMW:[id number] DOB: 03/04/1951 DOA: 01/21/2024 PCP: Seabron Lenis, MD    Brief Narrative:  This 73 year old Male with history of cardiac arrest earlier this year, congestive heart failure with reduced EF, A-fib, PE presenting with nausea,  vomiting found to have esophageal rupture/tear requiring stent placement 01/23/2024.  Swallow study revealed additional leak and had stent repositioning 01/31/2024. 10/15 stent repositioned. There was concerns of gastric content aspiration therefore mechanically ventilated in the ICU.  Bedside ultrasound revealed right-sided pleural effusion requiring chest tube placement with removal of 300 cc of exudative fluid. He was also successfully extubated on 10/16. Patient has had increased oxygen  requirement requiring reintubation on 10/20. 10/24 barium swallow showed persistent leak. Eventually, patient was extubated and transferred out of ICU on 10/28. 11/3 PEG tube placed. 11/19 swelling of right arm showing on ultrasound SVT cephalic vein.    Hospital course complicated by acute hypoxic respiratory, PNA, acute on chronic HFpEF for which he is on antibiotics and IV diuresis per advanced heart failure team. PEG tube placed on 11/3.  Dietitian consulted for initiation of tube feed. Taken off TPN as was tolerating tube feed at goal.    11/25 PEG tube placement x-ray shows no extravasation. 11/27 torsemide  resumed for upper extremity edema. 11/28 increased epigastric pain and N/V.  Severe duodenitis on repeat CT.  GI re-consulted.  Made NPO+ice, tube feeds stopped and G-tube placed to gravity drain. 11/29 N/V improved. Still epigastric pain but improved.  Re-trial clear liquids with G tube off drain with TF running.12/1 developed severe epigastric pain with n/v, significant output from G tube initially just tube feeds then coffee-ground color. Also developed melena. Underwent EGD with severe worsened gastroenteritis and multiple ischemic  ulcers.  12/3-Started on full liquid diet today with plans to advance to mech soft diet as tolerated in next 3-5 days per GI. Resumed on TF today, if he tolerated TF and PO, can discontinue TPN.  Assessment & Plan:   Principal Problem:   Esophageal rupture Active Problems:   Pleural effusion   Ventilator dependence (HCC)   Acute respiratory failure with hypoxia (HCC)   Protein-calorie malnutrition, severe   Intractable pain   Pressure injury of skin   Acute on chronic systolic CHF (congestive heart failure) (HCC)   Boerhaave syndrome : Esophageal rupture: - Last esophagram 02/09/24 showed persistent leak. -11/26 esophageal stent removed with no evidence of extravasation on esophagram in OR - Status post esophageal stent removal on 11/26 -repeat esophagogram 12/1 with no leak - CT surgery signed off 12/2  - Continue PPI IV BID - Increased sucralfate  TID to QID - Continue IV dilaudid  0.5mg  q2h PRN - plan to remove PEG tube outpatient. - will need CT surgery follow up outpatient.   Severe gastroenteritis; Ischemic ulcer disease - repeat CT A/P 12/1 showed persistent severe circumferential thickening of duodenum, and proximal jejunum, small area of free fluid in abdomen and pelvis, small bilateral pleural effusions, postsurgical esophagus findings. - He continues to have severe epigastric pain with nausea and intermittent vomiting. - EGD 12/2 with severe gastritis, multiple non-bleeding superficial duodenal and jejural ischemic ulcers. - Continue IV fluids while unable to tolerate tube feeds, take PO, or off TPN. - Continue IV Zosyn , duration TBD  - ContIinue PPI IV BID, sucralfate , Maalox as needed, Phenergan  IV as needed - plan to restart tube feeds on 12/3 and monitor for tolerance -Started on full liquid diet  with plans to advance to mech soft diet  as tolerated in next 3-5 days per GI.  -Resumed on TF yesterday, if he tolerate TF and PO, can discontinue TPN. - GI signed off  12/3 - dietician following   Normochromic Anemia: Coffee Ground output from PEG: Melena - Related to severe gastroenteritis and peptic ulcer disease. - Hgb 9.5 < 10.5, baseline 8-12 - hold eliquis  for now. - cont PPI IV BID and sucralfate as elsewhere - transfuse for Hgb goal > 7 - monitor CBC   Lung nodules : - seen on recent chest CT - likely secondary to aspiration /effusion per CT Surgery -- pain control as elsewhere - monitor clinically    Upper back pain: Musculoskeletal in nature imaging done as above 11/22 nonspecific-overall improved --Continue Flexeril  5 3 times daily as needed --Mobilize    Bilateral UE swelling: -Worse at right upper extremity. -Doppler US  ruled out DVT, Acute superficial thrombosis of cephalic veins noted -Warm compress and Arm elevation --Slightly improved with Lasix . --hold home torsemide  given concerns of dehydration with ongoing PEG output --Elevate arms in bed.   Superficial venous thrombosis : --Monitor --Hold Eliquis  given for now.  --Resume when no signs/symptoms of acute bleed and Hgb stable in the next 48-72 hours - monitor CBC    RUE and b/l thigh ecchymosis and petechiae Initially Noted around PICC site, originally placed 02/05/24. No warmth, tenderness, discharge, induration, fluctuance. Discussed with IV team. Might be a CHG/secure port reaction from not letting it dry and placing the dressing.  Low concern for acute infection, Platelets are within normal limits Cont local skin care as per IV team recs Monitor clinically   Acute hypoxic respiratory failure: Pneumonia Pleural effusion Acute CHF exacerbation: Respiratory failure multifactorial in the setting of pneumonia, pleural effusion, CHF exacerbation.  - Improved with Lasix  and antibiotics. - Continue Xopenex  inhalers  - Mucinex   --hold home torsemide  given concerns of dehydration with ongoing PEG output   Paroxysmal A-fib : - DCCV 07/06/2023 - Amiodarone  200mg   daily.  Plan to taper off as able as converted to NSR and depending on next steps with surgery? --metoprolol  as needed IV for heart rate above 110.   History of PE with DVTs: - with IVC filter placed 3/25 - hold Eliquis  5 for now. Resume no signs/symptoms of acute bleed and Hgb stable in the next 48-72 hours - monitor CBC    Hypertension: Hyperlipidemia: - maybe related to intractable pain with nausea and vomiting - Continue IV labetalol  and IV hydralazine  PRN. - Hydralazine  100 mg Q8H - atorvastatin  80  - Zetia  10  - Isordil  10 twice daily - metoprolol  12.5 mg BID  - losartan  25 daily  - hold home torsemide  given concerns of dehydration with significant PEG output --Monitor BP and renal function   Bipolar disorder : - overall stable - Appears not to be on medications.   Insomnia: --Continue trazodone     Protein energy malnutrition BMI 21 --Supplements and vitamins -On PO diet, TF and TPN - f/u  Dietitian for recommendations    CKD stage 3B -- stable . 11/30 - Cr stable & improved 1.97 >> 1.51 >> 1.58 -- Serum creatinine slightly up today 1.93.  Still baseline.   Metabolic acidosis - resolved. --Monitor labs   Multiple bedsores and wounds anterior right and left foot and injury stage I to bottom --Continue Gerhardt twice daily  --Lidoderm  patch for back pain --Schedule Lomotil  1 tablet BID and QID PRN --Diarrhea overall has improved.   History of Cardiac arrest 06/2023.  DVT prophylaxis:  SCDs Code Status: Full code Family Communication: No family at bed side Disposition Plan:   Status is: Inpatient Remains inpatient appropriate because: Not medically ready   Consultants:  PCCM GI  Procedures: As above Antimicrobials:  Anti-infectives (From admission, onward)    Start     Dose/Rate Route Frequency Ordered Stop   02/22/24 1844  fluconazole  (DIFLUCAN ) tablet 200 mg        200 mg Per Tube Daily 02/22/24 1844 03/03/24 1024   02/22/24 1630  fluconazole   (DIFLUCAN ) tablet 200 mg  Status:  Discontinued        200 mg Oral Daily 02/22/24 1537 02/22/24 1844   02/13/24 0900  piperacillin -tazobactam (ZOSYN ) IVPB 3.375 g  Status:  Discontinued        3.375 g 12.5 mL/hr over 240 Minutes Intravenous Every 8 hours 02/13/24 0812 03/20/24 1600   02/07/24 0800  vancomycin  (VANCOCIN ) IVPB 1000 mg/200 mL premix  Status:  Discontinued        1,000 mg 200 mL/hr over 60 Minutes Intravenous Every 24 hours 02/07/24 0632 02/09/24 0905   02/06/24 2200  meropenem  (MERREM ) 1 g in sodium chloride  0.9 % 100 mL IVPB  Status:  Discontinued        1 g 200 mL/hr over 30 Minutes Intravenous Every 12 hours 02/06/24 1127 02/13/24 0812   02/06/24 1300  vancomycin  (VANCOREADY) IVPB 1750 mg/350 mL  Status:  Discontinued        1,750 mg 175 mL/hr over 120 Minutes Intravenous Every 48 hours 02/04/24 1225 02/04/24 1226   02/06/24 0800  vancomycin  (VANCOCIN ) IVPB 1000 mg/200 mL premix        1,000 mg 200 mL/hr over 60 Minutes Intravenous  Once 02/06/24 0629 02/07/24 0829   02/04/24 1400  meropenem  (MERREM ) 1 g in sodium chloride  0.9 % 100 mL IVPB  Status:  Discontinued        1 g 200 mL/hr over 30 Minutes Intravenous Every 8 hours 02/04/24 1208 02/06/24 1127   02/04/24 1300  linezolid  (ZYVOX ) IVPB 600 mg  Status:  Discontinued        600 mg 300 mL/hr over 60 Minutes Intravenous Every 12 hours 02/04/24 1202 02/04/24 1208   02/04/24 1300  vancomycin  (VANCOREADY) IVPB 2000 mg/400 mL        2,000 mg 200 mL/hr over 120 Minutes Intravenous  Once 02/04/24 1208 02/04/24 1623   02/04/24 1208  vancomycin  variable dose per unstable renal function (pharmacist dosing)  Status:  Discontinued         Does not apply See admin instructions 02/04/24 1208 02/07/24 0632   01/31/24 1030  fluconazole  (DIFLUCAN ) IVPB 200 mg  Status:  Discontinued        200 mg 100 mL/hr over 60 Minutes Intravenous Daily 01/31/24 0938 02/20/24 1218   01/30/24 1445  piperacillin -tazobactam (ZOSYN ) IVPB 3.375 g   Status:  Discontinued        3.375 g 12.5 mL/hr over 240 Minutes Intravenous Every 8 hours 01/30/24 1354 02/04/24 1203   01/26/24 1000  amoxicillin -clavulanate (AUGMENTIN ) 400-57 MG/5ML suspension 875 mg  Status:  Discontinued        875 mg Oral Every 12 hours 01/26/24 0658 01/30/24 1354   01/26/24 1000  fluconazole  (DIFLUCAN ) 40 MG/ML suspension 200 mg  Status:  Discontinued        200 mg Oral Daily 01/26/24 0658 01/31/24 0938   01/22/24 1445  fluconazole  (DIFLUCAN ) IVPB 400 mg  Status:  Discontinued  400 mg 100 mL/hr over 120 Minutes Intravenous Every 24 hours 01/22/24 1436 01/22/24 1439   01/22/24 1445  fluconazole  (DIFLUCAN ) IVPB 200 mg  Status:  Discontinued        200 mg 100 mL/hr over 60 Minutes Intravenous Every 24 hours 01/22/24 1439 01/26/24 0658   01/21/24 2200  piperacillin -tazobactam (ZOSYN ) IVPB 3.375 g  Status:  Discontinued       Placed in Followed by Linked Group   3.375 g 12.5 mL/hr over 240 Minutes Intravenous Every 8 hours 01/21/24 1607 01/26/24 0658   01/21/24 1615  piperacillin -tazobactam (ZOSYN ) IVPB 3.375 g       Placed in Followed by Linked Group   3.375 g 100 mL/hr over 30 Minutes Intravenous  Once 01/21/24 1607 01/21/24 1651      Subjective: Patient was seen and examined at bedside.  Overnight events noted. Patient was very short of breath after having a PT session. Patient denies any chest pain.  Patient reports his abdomen is distended.  Objective: Vitals:   03/21/24 0000 03/21/24 0300 03/21/24 0346 03/21/24 0736  BP: (!) 144/59 (!) 153/65  (!) 162/72  Pulse: 74 71  84  Resp: (!) 22 (!) 22  19  Temp: 98.6 F (37 C) 98 F (36.7 C)  98.1 F (36.7 C)  TempSrc: Oral Oral  Oral  SpO2: 94% 93%  93%  Weight:   83.5 kg   Height:        Intake/Output Summary (Last 24 hours) at 03/21/2024 1152 Last data filed at 03/21/2024 1118 Gross per 24 hour  Intake 2566.21 ml  Output 625 ml  Net 1941.21 ml   Filed Weights   03/19/24 0844 03/20/24  0300 03/21/24 0346  Weight: 82.6 kg 85.5 kg 83.5 kg    Examination:  General exam: Appears calm and comfortable, deconditioned, not in any acute distress, both arms are swollen. Respiratory system: CTA Bilaterally . Respiratory effort normal. RR 16 Cardiovascular system: S1 & S2 heard, RRR. No JVD, murmurs, rubs, gallops or clicks.  Gastrointestinal system: Abdomen is soft, mildly distended and nontender. Normal bowel sounds heard. Central nervous system: Alert and oriented x 3. No focal neurological deficits. Extremities: Both arms and legs are swollen with multiple petechiae noted in both thighs Skin: No rashes, lesions or ulcers Psychiatry: Judgement and insight appear normal. Mood & affect appropriate.   Data Reviewed: I have personally reviewed following labs and imaging studies  CBC: Recent Labs  Lab 03/15/24 0354 03/16/24 0830 03/19/24 0420 03/19/24 1722 03/20/24 0500 03/20/24 1727 03/21/24 0345  WBC 10.4   < > 11.2* 11.9* 10.5 11.0* 11.8*  NEUTROABS 8.8*  --   --   --   --   --   --   HGB 10.5*   < > 9.5* 9.5* 8.8* 9.1* 9.0*  HCT 33.7*   < > 30.5* 30.2* 28.3* 29.2* 28.9*  MCV 89.4   < > 88.2 87.5 88.2 91.3 88.7  PLT 215   < > 183 191 186 212 202   < > = values in this interval not displayed.   Basic Metabolic Panel: Recent Labs  Lab 03/16/24 0830 03/17/24 0530 03/18/24 0420 03/19/24 0420 03/19/24 0613 03/20/24 0500 03/21/24 0345  NA 139 137 136 137 138 141 142  K 4.0 3.8 3.5 3.4* 3.5 3.5 4.2  CL 106 105 104 104 108 104 107  CO2 23 23 25 22 23 27 27   GLUCOSE 107* 123* 132* 547* 150* 140* 146*  BUN 30* 29*  32* 25* 27* 24* 39*  CREATININE 1.51* 1.58* 1.73* 1.65* 1.58* 1.60* 1.93*  CALCIUM  8.1* 7.9* 7.6* 7.4* 7.9* 7.9* 7.9*  MG 2.0  --   --   --  2.0 2.1 2.3  PHOS 2.4* 2.2* 2.8  --  3.0 2.1* 2.2*   GFR: Estimated Creatinine Clearance: 39.7 mL/min (A) (by C-G formula based on SCr of 1.93 mg/dL (H)). Liver Function Tests: Recent Labs  Lab 03/20/24 0500  03/21/24 0345  AST 41 31  ALT 82* 68*  ALKPHOS 54 63  BILITOT 0.5 0.5  PROT 4.0* 4.0*  ALBUMIN  <1.5* <1.5*   No results for input(s): LIPASE, AMYLASE in the last 168 hours. No results for input(s): AMMONIA in the last 168 hours. Coagulation Profile: No results for input(s): INR, PROTIME in the last 168 hours. Cardiac Enzymes: No results for input(s): CKTOTAL, CKMB, CKMBINDEX, TROPONINI in the last 168 hours. BNP (last 3 results) No results for input(s): PROBNP in the last 8760 hours. HbA1C: No results for input(s): HGBA1C in the last 72 hours. CBG: Recent Labs  Lab 03/19/24 2012 03/20/24 0033 03/20/24 0425 03/21/24 0008 03/21/24 0559  GLUCAP 141* 151* 139* 152* 140*   Lipid Profile: No results for input(s): CHOL, HDL, LDLCALC, TRIG, CHOLHDL, LDLDIRECT in the last 72 hours. Thyroid  Function Tests: No results for input(s): TSH, T4TOTAL, FREET4, T3FREE, THYROIDAB in the last 72 hours. Anemia Panel: No results for input(s): VITAMINB12, FOLATE, FERRITIN, TIBC, IRON , RETICCTPCT in the last 72 hours. Sepsis Labs: No results for input(s): PROCALCITON, LATICACIDVEN in the last 168 hours.  Recent Results (from the past 240 hours)  Surgical pcr screen     Status: Abnormal   Collection Time: 03/12/24  4:05 PM   Specimen: Nasal Mucosa; Nasal Swab  Result Value Ref Range Status   MRSA, PCR POSITIVE (A) NEGATIVE Final    Comment: RESULT CALLED TO, READ BACK BY AND VERIFIED WITH: RN T. LILIAN [phone] @2023  FH    Staphylococcus aureus POSITIVE (A) NEGATIVE Final    Comment: (NOTE) The Xpert SA Assay (FDA approved for NASAL specimens in patients 69 years of age and older), is one component of a comprehensive surveillance program. It is not intended to diagnose infection nor to guide or monitor treatment. Performed at [facility], [address], [place], [place] [address]     Radiology Studies: No results  found.  Scheduled Meds:  amiodarone   200 mg Per Tube Daily   atorvastatin   80 mg Per Tube Daily   Chlorhexidine  Gluconate Cloth  6 each Topical Daily   diphenoxylate -atropine   1 tablet Per Tube BID   ezetimibe   10 mg Per Tube Daily   feeding supplement  237 mL Oral BID BM   furosemide   40 mg Intravenous Daily   [name] butt cream   Topical BID   hydrALAZINE   100 mg Per Tube Q8H   isosorbide  dinitrate  10 mg Per Tube BID   lidocaine   1 patch Transdermal Q24H   losartan   25 mg Per Tube Daily   metoprolol  tartrate  12.5 mg Per Tube BID   mouth rinse  15 mL Mouth Rinse 4 times per day   pantoprazole  (PROTONIX ) IV  40 mg Intravenous Q12H   saccharomyces boulardii  250 mg Per Tube BID   sodium chloride  flush  10-40 mL Intracatheter Q12H   sucralfate  1 g Per Tube Q6H   Continuous Infusions:  dextrose  5% lactated ringers  Stopped (03/19/24 1950)   promethazine  (PHENERGAN ) injection (IM  or IVPB) Stopped (03/18/24 0911)   TPN ADULT (ION) 90 mL/hr at 03/21/24 0500     LOS: 60 days    Time spent: 50 Mins    Darcel Dawley, MD [facility]   If 7PM-7AM, please contact night-coverage

## 2024-03-21 NOTE — Progress Notes (Signed)
 PT Cancellation Note  Patient Details Name: Billy [name] MRN: [id number] DOB: 1950-08-08   Cancelled Treatment:    Reason Eval/Treat Not Completed: (P) Medical issues which prohibited therapy. RN deferred pt not feeling well, see new imaging results. Will continue efforts per PT POC as schedule permits.    Ayen Viviano 03/21/2024, 3:41 PM

## 2024-03-21 NOTE — Progress Notes (Signed)
 Looks much better WOB much improved even has oxygen  off and room air sats 91% SBP still in 180s Plan Will cont ntg gtt where it is (placed order to stop if SBP <110) Have instructed Nursing to administer the PRN labetolol I've adjusted his diuretics for am but I suspect he will need more aggressive volume removal Again think he would benefit from HF being reengaged to fine tune his medical rx  Will keep BIPAP PRN and wean O2 for sats > 92% No need to move to ICU

## 2024-03-21 NOTE — Plan of Care (Signed)
  Problem: Education: Goal: Knowledge of General Education information will improve Description: Including pain rating scale, medication(s)/side effects and non-pharmacologic comfort measures Outcome: Not Progressing   Problem: Health Behavior/Discharge Planning: Goal: Ability to manage health-related needs will improve Outcome: Not Progressing   Problem: Clinical Measurements: Goal: Will remain free from infection Outcome: Not Progressing Goal: Diagnostic test results will improve Outcome: Not Progressing Goal: Respiratory complications will improve Outcome: Not Progressing Goal: Cardiovascular complication will be avoided Outcome: Not Progressing   Problem: Activity: Goal: Risk for activity intolerance will decrease Outcome: Not Progressing   Problem: Nutrition: Goal: Adequate nutrition will be maintained Outcome: Not Progressing   Problem: Coping: Goal: Level of anxiety will decrease Outcome: Not Progressing   Problem: Elimination: Goal: Will not experience complications related to bowel motility Outcome: Not Progressing Goal: Will not experience complications related to urinary retention Outcome: Not Progressing   Problem: Pain Managment: Goal: General experience of comfort will improve and/or be controlled Outcome: Not Progressing   Problem: Safety: Goal: Ability to remain free from injury will improve Outcome: Not Progressing   Problem: Skin Integrity: Goal: Risk for impaired skin integrity will decrease Outcome: Not Progressing   Problem: Education: Goal: Ability to describe self-care measures that may prevent or decrease complications (Diabetes Survival Skills Education) will improve Outcome: Not Progressing   Problem: Coping: Goal: Ability to adjust to condition or change in health will improve Outcome: Not Progressing   Problem: Fluid Volume: Goal: Ability to maintain a balanced intake and output will improve Outcome: Not Progressing   Problem:  Health Behavior/Discharge Planning: Goal: Ability to identify and utilize available resources and services will improve Outcome: Not Progressing Goal: Ability to manage health-related needs will improve Outcome: Not Progressing   Problem: Metabolic: Goal: Ability to maintain appropriate glucose levels will improve Outcome: Not Progressing   Problem: Nutritional: Goal: Maintenance of adequate nutrition will improve Outcome: Not Progressing Goal: Progress toward achieving an optimal weight will improve Outcome: Not Progressing   Problem: Skin Integrity: Goal: Risk for impaired skin integrity will decrease Outcome: Not Progressing   Problem: Tissue Perfusion: Goal: Adequacy of tissue perfusion will improve Outcome: Not Progressing   Problem: Activity: Goal: Ability to tolerate increased activity will improve Outcome: Not Progressing   Problem: Respiratory: Goal: Ability to maintain a clear airway and adequate ventilation will improve Outcome: Not Progressing   Problem: Role Relationship: Goal: Method of communication will improve Outcome: Not Progressing   Problem: Education: Goal: Ability to describe self-care measures that may prevent or decrease complications (Diabetes Survival Skills Education) will improve Outcome: Not Progressing Goal: Individualized Educational Video(s) Outcome: Not Progressing   Problem: Coping: Goal: Ability to adjust to condition or change in health will improve Outcome: Not Progressing   Problem: Fluid Volume: Goal: Ability to maintain a balanced intake and output will improve Outcome: Not Progressing   Problem: Health Behavior/Discharge Planning: Goal: Ability to identify and utilize available resources and services will improve Outcome: Not Progressing Goal: Ability to manage health-related needs will improve Outcome: Not Progressing   Problem: Metabolic: Goal: Ability to maintain appropriate glucose levels will improve Outcome: Not  Progressing   Problem: Nutritional: Goal: Maintenance of adequate nutrition will improve Outcome: Not Progressing Goal: Progress toward achieving an optimal weight will improve Outcome: Not Progressing   Problem: Skin Integrity: Goal: Risk for impaired skin integrity will decrease Outcome: Not Progressing   Problem: Tissue Perfusion: Goal: Adequacy of tissue perfusion will improve Outcome: Not Progressing

## 2024-03-21 NOTE — Progress Notes (Signed)
 PHARMACY - TOTAL PARENTERAL NUTRITION CONSULT NOTE   Indication: intolerance to enteral feedings, esophageal perforation  Patient Measurements: Height: 6' 1 (185.4 cm) Weight: 83.5 kg (184 lb 1.4 oz) IBW/kg (Calculated) : 79.9 TPN AdjBW (KG): 82.6 Body mass index is 24.29 kg/m.  Assessment:  73 year old man with medical history significant for cardiac arrest earlier this year who was admitted after several days of nausea and vomiting followed by sudden onset severe tearing epigastric pain radiating to back and chest. 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 Pharmacy consulted to initiate TPN due to inability to tolerate enteral nutrition at this time.  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 PTA takes torsemide  20 mg PO daily, now held. Aggressive fluid resuscitation recommended per GI. Per HF, patient currently euvolemic but will hold diuretic for now. Due to CHF will target lower end of fluid range for TPN and supplement additional fluids per MD outside of TPN to prevent fluid overload.   Stent placed on 10/6, esophogram not showing any leak. Diet advanced to CLD. 10/15 Leak present and made NPO. 10/24 esophagram with persistent leak; will need stent replacement in 3-4 weeks.   12/1 re-consulted for TPN due to maroon output from PEG, N/V and concern for enteritis/duodenitis.   Glucose / Insulin : A1C 6.0% (02/2023), BG < 150, off SSI  Electrolytes:  K 4.2 (received 42 mEq IV), phos 2.2 (Received 15 mmol IV) ,  other lytes wnl Renal: SCr 1.93 up (BL ~2), BUN 39 up Hepatic:  last alk phos/AST/ALT/tbili wnl 11/27, alb 1.6, TG wnl 11/10 Intake / Output; MIVF: furosemide  40mg  IV daily, sucrafate q6hr; UOP incompletely charted, LBM 12/2    GI Imaging: 10/5 CT: concerning for esophageal perforation, masslike area of soft tissue thickening adjacent to and surrounding the esophagus  10/6 Esoph XR: distal esophageal perforation 10/7 Esoph XR:  No leak associated with stent coverage of esophageal perforation 10/14 KUB: Gas-filled loops of small bowel and colon suggestive of ileus 10/15 Esoph XR: esophageal perforation/leak at the distal esophagus, proximal to the esophageal stent 10/24 esophagram: persistent leak despite being covered by the esophageal stent.  11/25 KUB: normal gas pattern 11/28 KUB: no acute findings  11/28 CT: concerning for severe enteritis.  Again noted is a pocket of gas along the left side of the mid/distal esophagus 12/1 KUB- nonobstructive bowel gas, no acute findings  12/1 esophogram: no perforation present 12/1 CT: wall thickening concerning for enteritis, small pocket of gas diminished from prior  GI Surgeries / Procedures:  10/6: esophageal stent placement  10/15 EGD and stent repositioning  11/26 esophageal stent removal  12/2 EGD  Central access: 02/05/24 TPN start date: 01/22/24 - 11/11; 12/2 >>   Goal TPN = 71ml/hr provides 110 g protein and 2400 kcal    RD Assessment:  Estimated Needs Total Energy Estimated Needs: 2300-2500 kcals Total Protein Estimated Needs: 110-130 g Total Fluid Estimated Needs: 2L   Current Nutrition:  TPN + NPO  12/3 re-started TF via PEG (goal 99mL/h, due to start this morning - not started yet) 12/3 FLD + TF - ate 90-100% of 3x FLD house trays- including milk shake, bisquick mix, milk, juice; tolerated TF at goal 9ml/hr for 10 hours then decreased to 25ml/hr for abd distention (but no nausea or pain)   Plan:  Stop TPN per RD and MD. Trial po with calorie count. Taper TPN to half rate at 16:00 then stop at 18:00    [address]  Laurence, PharmD, BCPS, BCCP Clinical Pharmacist  Please check AMION for all [facility] phone numbers After 10:00 PM, call Main Pharmacy [phone]
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
## 2024-03-21 NOTE — Progress Notes (Signed)
 Mobility Specialist: Progress Note   03/21/24 1600  Mobility  Activity Ambulated with assistance  Level of Assistance Contact guard assist, steadying assist  Assistive Device Front wheel walker  Distance Ambulated (ft) 150 ft  Activity Response Tolerated well  Mobility Referral Yes  Mobility visit 1 Mobility  Mobility Specialist Start Time (ACUTE ONLY) 0920  Mobility Specialist Stop Time (ACUTE ONLY) 0948  Mobility Specialist Time Calculation (min) (ACUTE ONLY) 28 min    Pt received in bed, agreeable to mobility session. Had a BM in bed and requesting to be cleaned up. MinA to roll, totA for pericare. C/o SOB and general body pain during bed mobility. MinA for supine>sit to assist with trunk elevation. CGA for STS and ambulation. Ambulated up and down the hallway twice and returned to room. VSS on RA. Returned to supine with light minA. SOB and fatigued by EOS. Left in bed with all needs met, call bell in reach.   Ileana Lute Mobility Specialist Please contact via SecureChat or Rehab office at [phone]

## 2024-03-21 NOTE — Progress Notes (Addendum)
 Nutrition Follow-up  DOCUMENTATION CODES:   Severe malnutrition in context of acute illness/injury  INTERVENTION:  Discontinue parenteral nutrition as oral diet re-initiated Discontinue tube feedings to assess tolerance to PO diet Monitor diet advancement and tolerance and need for continued nutrition support Start calorie count Add Ensure Plus High Protein po BID, each supplement provides 350 kcal and 20 grams of protein  Add Magic cup TID with meals, each supplement provides 290 kcal and 9 grams of protein Draw vitamin C and K w/ CRP related to petechia   NUTRITION DIAGNOSIS:  Severe Malnutrition related to acute illness (may have chronic component due to time frame of reported wt loss) as evidenced by severe fat depletion, moderate muscle depletion.  GOAL:  Patient will meet greater than or equal to 90% of their needs  MONITOR:  Diet advancement, Labs, Weight trends, I & O's, Skin (TPN)  REASON FOR ASSESSMENT:  Consult Enteral/tube feeding initiation and management  ASSESSMENT:   73 yo male admitted post several days of N/V followed by sudden onset of severe epigastric pain radiating to chest and back. Pt found to have small esophageal perforation above GE junction. PMH includes CAD, chronic HFpEF, HTN, HLD, DVT, CVA, PAF, CKD 3b.  10/05 Admitted 10/06 Esophagogram/barium study: distal esophageal perforation, TPN started 10/07 S/p Esophageal stent, TPN at goal rate of 80 ml/hr 10/08  CLD 10/10 Diet advanced to Dysphagia 3, Calorie Count 10/13 No meal tickets for calorie count, pt reports eating bites only 10/15 Esophageal stent migration with leak on esophagram with return to OR for stent repositioning, likely aspiration event requiring intubation 10/16 Extubated, Chest tube placed 10/17 Limited echo with EF 50-55%, RV mildly reduced 10/20 Intubated early AM, L pleural effusion, pigtail chest tube inserted 10/23 - extubated; limited echo: 60-65% 10/24 - esophagram:  persistent leak 10/28 - transfer out of ICU 11/03 - PEG tube placement  11/04 - TF started 11/5 - TF at 20 ml/hr 11/6 - TPN decreased to meet 50% of pt's needs, TF at 50 ml/hr,  11/7 - TF at 60 ml/hr, multiple loose BM, decrease rate of tube feeds to 45 ml/hr, continue TPN to meet 50% of needs 11/10 - increase TF to 79ml/hr; TPN meet 50% of needs 11/11 - increase TF to goal rate (modified to Vital 1.5 as Mallie Pinion out of stock); d/c TPN tomorrow 11/12 - modify to Vital 1.5 @60ml /hr (goal rate) 11/15 - increased stool output; modified back to KF1.4 @65ml /hr (goal rate) 11/20 - modified to boluses 11/21 - developed chest pain in the evening; modified back to continuous 11/23 - CT chest: small b/l pleural effusions w/ bibasilar atelectasis, PNA not excluded, scattered clusters of nodular density predominantly in upper and right lower lobes 11/25 - Xray abd: PEG tube in stomach w/ no contrast extravasation noted 11/26 - esophageal stent removal; advanced to clear liquid diet 11/28 - enteritis; TF on hold; GI consulted 11/29 - GI singed off 11/30 - TF re-initiated at 8ml/hr and titrating up 12/01 - TF paused due to severe epigastric pain + N/V, GI re-consulted  12/02 - TPN initiated 12/03 - GI re-initiated TF at goal rate and full liquid diet; abdominal distention; TF rate reduced to 41ml/hr; GI sign off 12/04 - TPN and TF stopped; calorie count started   GI in to see patient yesterday. Re-initiated TF at goal rate and full liquid diet. Patient developed abdominal distention overnight and TF rate reduced to 17ml/hr. Discussed with MD and RN this morning.   Multiple  variables could be contributing to this. TF re-initiated at goal rate instead of at lower rate as previously ordered, re-initiation of oral diet, continued use of Lomotil  while TF not running and in the presence of increased soluble fiber intake with full liquid diet as well as significant edema to upper body. He is with some  difficulty breathing this morning likely r/t fluid overload. MD preference to hold tube feedings. Discussed concern for inability to meet his elevated estimated calorie and protein needs via oral route alone, as intake has been variable in the presence of recent GI symptoms. Suggest nocturnal feedings to supplement oral intake. MD continues to prefer holding tube feeds. Will monitor the need to re-initiate. Will start calorie count.    RN reports patient ate most all of his meal trays yesterday before developing distention in the evening. Patient reporting feeling like his stomach is pushed up and full. Did not report this previously on [employer] tube feed formula. Met with patient at bedside who had just finished working with mobility. Abdomen does, indeed, appear and feel somewhat taught and distended. He is also coughing up bright red tinged sputum. Made RN aware. Noted with petechia to bilateral thighs and RUE. Will add vitamin C and K draw to rule out vitamin deficiency as a cause.   Admit Weight: 72.8 kg Current Weight: 83.5 kg Lowest Weight: 66.8 kg on 11/06   Questionable weight gain of 13lbs overnight from 11/29-11/30. Weight has remained stable since that time. Will continue to monitor trend. He is noted with significant pitting edema to BUEs and notable swelling to lower extremities. Compression socks in place. Verbalizes concern this morning at bedside regarding degree of edema in the presence of diuretic use and little UOP. Bowels stable. Lomotil  being stopped.     Did require potassium chloride  yesterday.  PHOS low today, despite repletion yesterday. BUN/Crt with slight trend up today. Lasix  continues. No antiemetic use x3 days.    Drains/Lines: R brachial: PICC, triple lumen LUQ: G-tube (24Fr) placed 11/03 UOP: 425 x24 hours? - inadequate compared to volume consumed   Labs from 11/10 reviewd: Sodium 142 (wdl) Potassium 4.2 (wdl) PHOS 2.2 (L) BUN 39 Creatinine 1.93  Corr Ca  >9.9  WBC 11.8 (H) CBGs 140-146 x24 hours A1c 6.0 (01/2024)   Meds: Furosemide  Pantoprazole  Florastor Sulcarafate   NUTRITION - FOCUSED PHYSICAL EXAM:  Flowsheet Row Most Recent Value  Orbital Region Severe depletion  Upper Arm Region Unable to assess  [edema]  Thoracic and Lumbar Region Severe depletion  Buccal Region Moderate depletion  Temple Region Severe depletion  Clavicle Bone Region Moderate depletion  Clavicle and Acromion Bone Region Moderate depletion  Scapular Bone Region Moderate depletion  Dorsal Hand Mild depletion  Patellar Region Unable to assess  [edema]  Anterior Thigh Region Unable to assess  [edema]  Posterior Calf Region Unable to assess  [edema]  Edema (RD Assessment) Moderate  Hair Reviewed  Eyes Reviewed  Mouth Reviewed  Skin Reviewed  Nails Reviewed    Diet Order:   Diet Order             Diet full liquid Room service appropriate? Yes; Fluid consistency: Thin  Diet effective now            EDUCATION NEEDS:  Education needs have been addressed  Skin:  Stage 1 to L foot and right buttocks resolved  Last BM:  12/03 - type 6 x1  Height:  Ht Readings from Last 1 Encounters:  03/19/24  6' 1 (1.854 m)   Weight:  Wt Readings from Last 1 Encounters:  03/21/24 83.5 kg   Ideal Body Weight:  83.6 kg  BMI:  Body mass index is 24.29 kg/m.  Estimated Nutritional Needs:   Kcal:  2300-2500 kcals  Protein:  110-130 g  Fluid:  2L  Blair Deaner MS, RD, LDN Registered Dietitian Clinical Nutrition RD Inpatient Contact Info in Amion

## 2024-03-21 NOTE — Plan of Care (Signed)
  Problem: Education: Goal: Knowledge of General Education information will improve Description: Including pain rating scale, medication(s)/side effects and non-pharmacologic comfort measures Outcome: Progressing   Problem: Clinical Measurements: Goal: Cardiovascular complication will be avoided Outcome: Progressing   Problem: Activity: Goal: Risk for activity intolerance will decrease Outcome: Progressing   Problem: Nutrition: Goal: Adequate nutrition will be maintained Outcome: Progressing   Problem: Coping: Goal: Level of anxiety will decrease Outcome: Progressing   Problem: Elimination: Goal: Will not experience complications related to urinary retention Outcome: Progressing

## 2024-03-22 ENCOUNTER — Inpatient Hospital Stay (HOSPITAL_COMMUNITY)

## 2024-03-22 DIAGNOSIS — J9690 Respiratory failure, unspecified, unspecified whether with hypoxia or hypercapnia: Secondary | ICD-10-CM | POA: Diagnosis not present

## 2024-03-22 DIAGNOSIS — J90 Pleural effusion, not elsewhere classified: Secondary | ICD-10-CM | POA: Diagnosis not present

## 2024-03-22 DIAGNOSIS — I700 Atherosclerosis of aorta: Secondary | ICD-10-CM | POA: Diagnosis not present

## 2024-03-22 DIAGNOSIS — R918 Other nonspecific abnormal finding of lung field: Secondary | ICD-10-CM | POA: Diagnosis not present

## 2024-03-22 DIAGNOSIS — K223 Perforation of esophagus: Secondary | ICD-10-CM | POA: Diagnosis not present

## 2024-03-22 LAB — GLUCOSE, CAPILLARY
Glucose-Capillary: 102 mg/dL — ABNORMAL HIGH (ref 70–99)
Glucose-Capillary: 106 mg/dL — ABNORMAL HIGH (ref 70–99)
Glucose-Capillary: 120 mg/dL — ABNORMAL HIGH (ref 70–99)
Glucose-Capillary: 120 mg/dL — ABNORMAL HIGH (ref 70–99)

## 2024-03-22 LAB — BASIC METABOLIC PANEL WITH GFR
Anion gap: 7 (ref 5–15)
BUN: 47 mg/dL — ABNORMAL HIGH (ref 8–23)
CO2: 24 mmol/L (ref 22–32)
Calcium: 7.5 mg/dL — ABNORMAL LOW (ref 8.9–10.3)
Chloride: 107 mmol/L (ref 98–111)
Creatinine, Ser: 2.16 mg/dL — ABNORMAL HIGH (ref 0.61–1.24)
GFR, Estimated: 32 mL/min — ABNORMAL LOW (ref >60)
Glucose, Bld: 178 mg/dL — ABNORMAL HIGH (ref 70–99)
Potassium: 4 mmol/L (ref 3.5–5.1)
Sodium: 138 mmol/L (ref 135–145)

## 2024-03-22 LAB — PHOSPHORUS: Phosphorus: 2.4 mg/dL — ABNORMAL LOW (ref 2.5–4.6)

## 2024-03-22 LAB — CBC
HCT: 28.5 % — ABNORMAL LOW (ref 39.0–52.0)
Hemoglobin: 8.7 g/dL — ABNORMAL LOW (ref 13.0–17.0)
MCH: 27.1 pg (ref 26.0–34.0)
MCHC: 30.5 g/dL (ref 30.0–36.0)
MCV: 88.8 fL (ref 80.0–100.0)
Platelets: 184 K/uL (ref 150–400)
RBC: 3.21 MIL/uL — ABNORMAL LOW (ref 4.22–5.81)
RDW: 16.8 % — ABNORMAL HIGH (ref 11.5–15.5)
WBC: 16.6 K/uL — ABNORMAL HIGH (ref 4.0–10.5)
nRBC: 0 % (ref 0.0–0.2)

## 2024-03-22 LAB — C-REACTIVE PROTEIN: CRP: 10.8 mg/dL — ABNORMAL HIGH (ref <1.0)

## 2024-03-22 LAB — BRAIN NATRIURETIC PEPTIDE: B Natriuretic Peptide: 2404.1 pg/mL — ABNORMAL HIGH (ref 0.0–100.0)

## 2024-03-22 MED ORDER — CARVEDILOL 6.25 MG PO TABS
6.2500 mg | ORAL_TABLET | Freq: Two times a day (BID) | ORAL | Status: DC
  Administered 2024-03-22 – 2024-03-26 (×8): 6.25 mg via ORAL
  Filled 2024-03-22 (×8): qty 1

## 2024-03-22 MED ORDER — ISOSORBIDE DINITRATE 20 MG PO TABS
20.0000 mg | ORAL_TABLET | Freq: Three times a day (TID) | ORAL | Status: DC
  Administered 2024-03-22 – 2024-03-25 (×9): 20 mg
  Filled 2024-03-22 (×9): qty 1

## 2024-03-22 MED ORDER — FUROSEMIDE 10 MG/ML IJ SOLN
120.0000 mg | Freq: Once | INTRAVENOUS | Status: AC
  Administered 2024-03-22: 120 mg via INTRAVENOUS
  Filled 2024-03-22: qty 10

## 2024-03-22 MED ORDER — ISOSORBIDE DINITRATE 20 MG PO TABS
20.0000 mg | ORAL_TABLET | Freq: Two times a day (BID) | ORAL | Status: DC
  Filled 2024-03-22: qty 1

## 2024-03-22 NOTE — Progress Notes (Signed)
 PT Cancellation Note  Patient Details Name: Billy [name] MRN: [id number] DOB: 1950/11/14   Cancelled Treatment:    Reason Eval/Treat Not Completed: (P) Other (comment) (Pt requesting therapy come back between 2-3pm, he is not feeling well at the moment.) Will continue with PT POC as schedule permits.    Nakia Remmers 03/22/2024, 12:22 PM

## 2024-03-22 NOTE — Progress Notes (Signed)
 Orthopedic Tech Progress Note Patient Details:  Billy [name] 1950-05-06 [id number]  Ortho Devices Type of Ortho Device: Radio broadcast assistant Ortho Device/Splint Location: Bilateral Ortho Device/Splint Interventions: Ordered, Application, Adjustment   Post Interventions Patient Tolerated: Well  [name] 03/22/2024, 1:56 PM
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
## 2024-03-22 NOTE — Progress Notes (Signed)
 Calorie Count Note  48 hour calorie count ordered. Day one now complete. Insufficient intake observed. Likely 2/2 fluid overload as patient reported feeling full yesterday. Critical care and heart failure reconsulted as patient found to be significantly volume overloaded. This is likely impacting intake.   Estimated Nutritional Needs:  Kcal:  2300-2500 kcals Protein:  110-130 g Fluid:  2L  Patient stating he feels much better today, but still observed with diffuse anasarca. Continue to encourage PO intake as tube feeds not longer running. Discussed his elevated needs with him at bedside. He states, if you start it back, please don't start it back at full blast. Reported that ideally, if PO intake desirable, would not need to restart tube feedings, or only in supplemental fashion rather than as sole means of nutrition support. He verbalized understanding.   Diet: Full liquid diet Supplements: Ensure Plus High Protein BID, Magic Cup TID  Day 1: 12/04 Breakfast: 10% vanilla ice cream, grits, 2% milk, grape juice, coffee (58 calories, 1.5g protein) Lunch: 50% potato soup (73 calories, 2g protein) Dinner: no documentation received (0 calories, 0g protein) Supplements: 50% Ensure Plus High Protein x1 (175 calories, 10g protein)  Day 1 total intake: 306 kcal (13% of minimum estimated needs)  13.5 protein (12% of minimum estimated needs)  INTERVENTION:  Continue to hold tube feeding and assess oral intake Monitor diet advancement and tolerance and need for continued nutrition support Continue calorie count Continue Ensure Plus High Protein po BID, each supplement provides 350 kcal and 20 grams of protein  Continue Magic cup TID with meals, each supplement provides 290 kcal and 9 grams of protein Draw vitamin C and K w/ CRP related to petechia    NUTRITION DIAGNOSIS:  Severe Malnutrition related to acute illness (may have chronic component due to time frame of reported wt loss) as  evidenced by severe fat depletion, moderate muscle depletion.   GOAL:  Patient will meet greater than or equal to 90% of their needs  Blair Deaner MS, RD, LDN Registered Dietitian Clinical Nutrition RD Inpatient Contact Info in Amion

## 2024-03-22 NOTE — Progress Notes (Signed)
 PROGRESS NOTE    Billy [name]  FMW:[id number] DOB: 1950/09/18 DOA: 01/21/2024 PCP: Seabron Lenis, MD    Brief Narrative:  This 73 year old Male with history of cardiac arrest earlier this year, congestive heart failure with reduced EF, A-fib, PE presenting with nausea,  vomiting found to have esophageal rupture/tear requiring stent placement 01/23/2024.  Swallow study revealed additional leak and had stent repositioning 01/31/2024. 10/15 stent repositioned. There was concerns of gastric content aspiration therefore mechanically ventilated in the ICU.  Bedside ultrasound revealed right-sided pleural effusion requiring chest tube placement with removal of 300 cc of exudative fluid. He was also successfully extubated on 10/16. Patient has had increased oxygen  requirement requiring reintubation on 10/20. 10/24 barium swallow showed persistent leak. Eventually, patient was extubated and transferred out of ICU on 10/28. 11/3 PEG tube placed. 11/19 swelling of right arm showing on ultrasound SVT cephalic vein.    Hospital course complicated by acute hypoxic respiratory, PNA, acute on chronic HFpEF for which he is on antibiotics and IV diuresis per advanced heart failure team. PEG tube placed on 11/3.  Dietitian consulted for initiation of tube feed. Taken off TPN as was tolerating tube feed at goal.    11/25 PEG tube placement x-ray shows no extravasation. 11/27 torsemide  resumed for upper extremity edema. 11/28 increased epigastric pain and N/V.  Severe duodenitis on repeat CT.  GI re-consulted.  Made NPO+ice, tube feeds stopped and G-tube placed to gravity drain. 11/29 N/V improved. Still epigastric pain but improved.  Re-trial clear liquids with G tube off drain with TF running.12/1 developed severe epigastric pain with n/v, significant output from G tube initially just tube feeds then coffee-ground color. Also developed melena. Underwent EGD with severe worsened gastroenteritis and multiple ischemic  ulcers.  12/3-Started on full liquid diet today with plans to advance to mech soft diet as tolerated in next 3-5 days per GI. Resumed on TF today, if he tolerated TF and PO, can discontinue TPN.  Assessment & Plan:   Principal Problem:   Esophageal rupture Active Problems:   Pleural effusion   Ventilator dependence (HCC)   Acute respiratory failure with hypoxia (HCC)   Protein-calorie malnutrition, severe   Intractable pain   Pressure injury of skin   Acute on chronic systolic CHF (congestive heart failure) (HCC)   Boerhaave syndrome : Esophageal rupture: - Last esophagram 02/09/24 showed persistent leak. -11/26 esophageal stent removed with no evidence of extravasation on esophagram in OR - Status post esophageal stent removal on 11/26 -repeat esophagogram 12/1 with no leak - CT surgery signed off 12/2  - Continue PPI IV BID - Increased sucralfate  TID to QID - Continue IV dilaudid  0.5mg  q2h PRN - plan to remove PEG tube outpatient. - will need CT surgery follow up outpatient.   Severe gastroenteritis; Ischemic ulcer disease - repeat CT A/P 12/1 showed persistent severe circumferential thickening of duodenum, and proximal jejunum, small area of free fluid in abdomen and pelvis, small bilateral pleural effusions, postsurgical esophagus findings. - He continues to have severe epigastric pain with nausea and intermittent vomiting. - EGD 12/2 with severe gastritis, multiple non-bleeding superficial duodenal and jejural ischemic ulcers. - Continue IV fluids while unable to tolerate tube feeds, take PO, or off TPN. - Continue IV Zosyn , duration TBD  - ContIinue PPI IV BID, sucralfate , Maalox as needed, Phenergan  IV as needed - plan to restart tube feeds on 12/3 and monitor for tolerance -Started on full liquid diet  with plans to advance to mech soft diet  as tolerated in next 3-5 days per GI.  -Resumed on TF yesterday, if he tolerate TF and PO, can discontinue TPN. - GI signed off  12/3 - dietician following   Normochromic Anemia: Coffee Ground output from PEG: Melena - Related to severe gastroenteritis and peptic ulcer disease. - Hgb 9.5 < 10.5, baseline 8-12 - hold eliquis  for now. - cont PPI IV BID and sucralfate  as elsewhere - transfuse for Hgb goal > 7 - monitor CBC   Lung nodules : - seen on recent chest CT - likely secondary to aspiration /effusion per CT Surgery -- pain control as elsewhere - monitor clinically    Upper back pain: Musculoskeletal in nature imaging done as above 11/22 nonspecific-overall improved --Continue Flexeril  5 3 times daily as needed --Mobilize    Bilateral UE swelling: -Worse at right upper extremity. -Doppler US  ruled out DVT, Acute superficial thrombosis of cephalic veins noted -Warm compress and Arm elevation --Slightly improved with Lasix . --hold home torsemide  given concerns of dehydration with ongoing PEG output --Elevate arms in bed.   Superficial venous thrombosis : --Monitor --Hold Eliquis  given for now.  --Resume when no signs/symptoms of acute bleed and Hgb stable in the next 48-72 hours - monitor CBC    RUE and b/l thigh ecchymosis and petechiae Initially Noted around PICC site, originally placed 02/05/24. No warmth, tenderness, discharge, induration, fluctuance. Discussed with IV team. Might be a CHG/secure port reaction from not letting it dry and placing the dressing.  Low concern for acute infection, Platelets are within normal limits Cont local skin care as per IV team recs Monitor clinically   Acute hypoxic respiratory failure: Pneumonia Pleural effusion Acute CHF exacerbation: Respiratory failure multifactorial in the setting of pneumonia, pleural effusion, CHF exacerbation.  - Improved with Lasix  and antibiotics. - Continue Xopenex  inhalers  - Mucinex   --hold home torsemide  given concerns of dehydration with ongoing PEG output. - Pulmonology consulted, patient received IV nitroglycerin   and a dose of Lasix  felt much better. - Advanced heart failure team reconsulted.  Will follow.   Paroxysmal A-fib : - DCCV 07/06/2023 - Amiodarone  200mg  daily.  Plan to taper off as able as converted to NSR and depending on next steps with surgery? --metoprolol  as needed IV for heart rate above 110.   History of PE with DVTs: - with IVC filter placed 3/25 - hold Eliquis  5 for now. Resume no signs/symptoms of acute bleed and Hgb stable in the next 48-72 hours - monitor CBC    Hypertension: Hyperlipidemia: - maybe related to intractable pain with nausea and vomiting - Continue IV labetalol  and IV hydralazine  PRN. - Hydralazine  100 mg Q8H - atorvastatin  80  - Zetia  10  - Isordil  10 twice daily - metoprolol  12.5 mg BID  - losartan  25 daily  - hold home torsemide  given concerns of dehydration with significant PEG output --Monitor BP and renal function   Bipolar disorder : - overall stable - Appears not to be on medications.   Insomnia: --Continue trazodone     Protein energy malnutrition BMI 21 --Supplements and vitamins -On PO diet, TF and TPN - f/u  Dietitian for recommendations    CKD stage 3B -- stable . 11/30 - Cr stable & improved 1.97 >> 1.51 >> 1.58 -- Serum creatinine slightly up today 1.93.  Still baseline.   Metabolic acidosis - resolved. --Monitor labs   Multiple bedsores and wounds anterior right and left foot and injury stage I to bottom --Continue Gerhardt twice daily  --Lidoderm   patch for back pain --Schedule Lomotil  1 tablet BID and QID PRN --Diarrhea overall has improved.   History of Cardiac arrest 06/2023.     DVT prophylaxis:  SCDs Code Status: Full code Family Communication: No family at bed side Disposition Plan:   Status is: Inpatient Remains inpatient appropriate because: Not medically ready.  Advanced heart failure team reconsulted.   Consultants:  PCCM GI  Procedures: As above Antimicrobials:  Anti-infectives (From admission,  onward)    Start     Dose/Rate Route Frequency Ordered Stop   03/21/24 1600  Ampicillin -Sulbactam (UNASYN ) 3 g in sodium chloride  0.9 % 100 mL IVPB        3 g 200 mL/hr over 30 Minutes Intravenous Every 8 hours 03/21/24 1539     02/22/24 1844  fluconazole  (DIFLUCAN ) tablet 200 mg        200 mg Per Tube Daily 02/22/24 1844 03/03/24 1024   02/22/24 1630  fluconazole  (DIFLUCAN ) tablet 200 mg  Status:  Discontinued        200 mg Oral Daily 02/22/24 1537 02/22/24 1844   02/13/24 0900  piperacillin -tazobactam (ZOSYN ) IVPB 3.375 g  Status:  Discontinued        3.375 g 12.5 mL/hr over 240 Minutes Intravenous Every 8 hours 02/13/24 0812 03/20/24 1600   02/07/24 0800  vancomycin  (VANCOCIN ) IVPB 1000 mg/200 mL premix  Status:  Discontinued        1,000 mg 200 mL/hr over 60 Minutes Intravenous Every 24 hours 02/07/24 0632 02/09/24 0905   02/06/24 2200  meropenem  (MERREM ) 1 g in sodium chloride  0.9 % 100 mL IVPB  Status:  Discontinued        1 g 200 mL/hr over 30 Minutes Intravenous Every 12 hours 02/06/24 1127 02/13/24 0812   02/06/24 1300  vancomycin  (VANCOREADY) IVPB 1750 mg/350 mL  Status:  Discontinued        1,750 mg 175 mL/hr over 120 Minutes Intravenous Every 48 hours 02/04/24 1225 02/04/24 1226   02/06/24 0800  vancomycin  (VANCOCIN ) IVPB 1000 mg/200 mL premix        1,000 mg 200 mL/hr over 60 Minutes Intravenous  Once 02/06/24 0629 02/07/24 0829   02/04/24 1400  meropenem  (MERREM ) 1 g in sodium chloride  0.9 % 100 mL IVPB  Status:  Discontinued        1 g 200 mL/hr over 30 Minutes Intravenous Every 8 hours 02/04/24 1208 02/06/24 1127   02/04/24 1300  linezolid  (ZYVOX ) IVPB 600 mg  Status:  Discontinued        600 mg 300 mL/hr over 60 Minutes Intravenous Every 12 hours 02/04/24 1202 02/04/24 1208   02/04/24 1300  vancomycin  (VANCOREADY) IVPB 2000 mg/400 mL        2,000 mg 200 mL/hr over 120 Minutes Intravenous  Once 02/04/24 1208 02/04/24 1623   02/04/24 1208  vancomycin  variable dose per  unstable renal function (pharmacist dosing)  Status:  Discontinued         Does not apply See admin instructions 02/04/24 1208 02/07/24 0632   01/31/24 1030  fluconazole  (DIFLUCAN ) IVPB 200 mg  Status:  Discontinued        200 mg 100 mL/hr over 60 Minutes Intravenous Daily 01/31/24 0938 02/20/24 1218   01/30/24 1445  piperacillin -tazobactam (ZOSYN ) IVPB 3.375 g  Status:  Discontinued        3.375 g 12.5 mL/hr over 240 Minutes Intravenous Every 8 hours 01/30/24 1354 02/04/24 1203   01/26/24 1000  amoxicillin -clavulanate (AUGMENTIN ) 400-57 MG/5ML  suspension 875 mg  Status:  Discontinued        875 mg Oral Every 12 hours 01/26/24 0658 01/30/24 1354   01/26/24 1000  fluconazole  (DIFLUCAN ) 40 MG/ML suspension 200 mg  Status:  Discontinued        200 mg Oral Daily 01/26/24 0658 01/31/24 0938   01/22/24 1445  fluconazole  (DIFLUCAN ) IVPB 400 mg  Status:  Discontinued        400 mg 100 mL/hr over 120 Minutes Intravenous Every 24 hours 01/22/24 1436 01/22/24 1439   01/22/24 1445  fluconazole  (DIFLUCAN ) IVPB 200 mg  Status:  Discontinued        200 mg 100 mL/hr over 60 Minutes Intravenous Every 24 hours 01/22/24 1439 01/26/24 0658   01/21/24 2200  piperacillin -tazobactam (ZOSYN ) IVPB 3.375 g  Status:  Discontinued       Placed in Followed by Linked Group   3.375 g 12.5 mL/hr over 240 Minutes Intravenous Every 8 hours 01/21/24 1607 01/26/24 0658   01/21/24 1615  piperacillin -tazobactam (ZOSYN ) IVPB 3.375 g       Placed in Followed by Linked Group   3.375 g 100 mL/hr over 30 Minutes Intravenous  Once 01/21/24 1607 01/21/24 1651      Subjective: Patient was seen and examined at bedside.  Overnight events noted. Patient seems much improved.  He reports feeling better after getting the dose of Lasix  yesterday and today. Patient denies any chest pain.  He was started on antibiotics.  Objective: Vitals:   03/22/24 0400 03/22/24 0447 03/22/24 0800 03/22/24 1201  BP: (!) 149/64  (!) 139/53 (!)  132/59  Pulse: 72  79 74  Resp: 20  18 20   Temp: 98.7 F (37.1 C)  98 F (36.7 C) 98.3 F (36.8 C)  TempSrc: Oral  Oral Oral  SpO2: 95%  91% 93%  Weight:  82 kg    Height:        Intake/Output Summary (Last 24 hours) at 03/22/2024 1423 Last data filed at 03/22/2024 0447 Gross per 24 hour  Intake 1408.41 ml  Output 925 ml  Net 483.41 ml   Filed Weights   03/20/24 0300 03/21/24 0346 03/22/24 0447  Weight: 85.5 kg 83.5 kg 82 kg    Examination:  General exam: Appears calm and comfortable, deconditioned, not in any acute distress, both arms are swollen. Respiratory system: CTA Bilaterally . Respiratory effort normal. RR 14 Cardiovascular system: S1 & S2 heard, RRR. No JVD, murmurs, rubs, gallops or clicks.  Gastrointestinal system: Abdomen is soft, mildly distended and nontender. Normal bowel sounds heard. Central nervous system: Alert and oriented x 3. No focal neurological deficits. Extremities: Both arms and legs are swollen with multiple petechiae noted in both thighs Skin: No rashes, lesions or ulcers Psychiatry: Judgement and insight appear normal. Mood & affect appropriate.   Data Reviewed: I have personally reviewed following labs and imaging studies  CBC: Recent Labs  Lab 03/20/24 0500 03/20/24 1727 03/21/24 0345 03/21/24 2057 03/22/24 0600  WBC 10.5 11.0* 11.8* 19.0* 16.6*  HGB 8.8* 9.1* 9.0* 9.3* 8.7*  HCT 28.3* 29.2* 28.9* 29.5* 28.5*  MCV 88.2 91.3 88.7 87.0 88.8  PLT 186 212 202 194 184   Basic Metabolic Panel: Recent Labs  Lab 03/16/24 0830 03/17/24 0530 03/18/24 0420 03/19/24 0420 03/19/24 0613 03/20/24 0500 03/21/24 0345 03/22/24 0600 03/22/24 0914  NA 139   < > 136 137 138 141 142  --  138  K 4.0   < > 3.5 3.4* 3.5  3.5 4.2  --  4.0  CL 106   < > 104 104 108 104 107  --  107  CO2 23   < > 25 22 23 27 27   --  24  GLUCOSE 107*   < > 132* 547* 150* 140* 146*  --  178*  BUN 30*   < > 32* 25* 27* 24* 39*  --  47*  CREATININE 1.51*   < > 1.73*  1.65* 1.58* 1.60* 1.93*  --  2.16*  CALCIUM  8.1*   < > 7.6* 7.4* 7.9* 7.9* 7.9*  --  7.5*  MG 2.0  --   --   --  2.0 2.1 2.3  --   --   PHOS 2.4*   < > 2.8  --  3.0 2.1* 2.2* 2.4*  --    < > = values in this interval not displayed.   GFR: Estimated Creatinine Clearance: 35.4 mL/min (A) (by C-G formula based on SCr of 2.16 mg/dL (H)). Liver Function Tests: Recent Labs  Lab 03/20/24 0500 03/21/24 0345  AST 41 31  ALT 82* 68*  ALKPHOS 54 63  BILITOT 0.5 0.5  PROT 4.0* 4.0*  ALBUMIN  <1.5* <1.5*   No results for input(s): LIPASE, AMYLASE in the last 168 hours. No results for input(s): AMMONIA in the last 168 hours. Coagulation Profile: No results for input(s): INR, PROTIME in the last 168 hours. Cardiac Enzymes: No results for input(s): CKTOTAL, CKMB, CKMBINDEX, TROPONINI in the last 168 hours. BNP (last 3 results) No results for input(s): PROBNP in the last 8760 hours. HbA1C: No results for input(s): HGBA1C in the last 72 hours. CBG: Recent Labs  Lab 03/21/24 1235 03/21/24 1801 03/21/24 2339 03/22/24 0608 03/22/24 1136  GLUCAP 150* 138* 109* 106* 120*   Lipid Profile: No results for input(s): CHOL, HDL, LDLCALC, TRIG, CHOLHDL, LDLDIRECT in the last 72 hours. Thyroid  Function Tests: No results for input(s): TSH, T4TOTAL, FREET4, T3FREE, THYROIDAB in the last 72 hours. Anemia Panel: No results for input(s): VITAMINB12, FOLATE, FERRITIN, TIBC, IRON , RETICCTPCT in the last 72 hours. Sepsis Labs: No results for input(s): PROCALCITON, LATICACIDVEN in the last 168 hours.  Recent Results (from the past 240 hours)  Surgical pcr screen     Status: Abnormal   Collection Time: 03/12/24  4:05 PM   Specimen: Nasal Mucosa; Nasal Swab  Result Value Ref Range Status   MRSA, PCR POSITIVE (A) NEGATIVE Final    Comment: RESULT CALLED TO, READ BACK BY AND VERIFIED WITH: RN T. LILIAN [phone] @2023  FH    Staphylococcus  aureus POSITIVE (A) NEGATIVE Final    Comment: (NOTE) The Xpert SA Assay (FDA approved for NASAL specimens in patients 53 years of age and older), is one component of a comprehensive surveillance program. It is not intended to diagnose infection nor to guide or monitor treatment. Performed at [facility], [address], [place], [place] [address]     Radiology Studies: DG CHEST PORT 1 VIEW Result Date: 03/21/2024 CLINICAL DATA:  Shortness of breath. EXAM: PORTABLE CHEST 1 VIEW COMPARISON:  Radiograph 03/09/2024, chest CT 03/15/2024 FINDINGS: Stable positioning of right upper extremity PICC. Stable heart size and mediastinal contours. Bilateral pleural effusions. Patchy opacity in the right mid, upper and lower lung zone as well as left lung base has worsened from prior radiograph. No pneumothorax. Left chest wall loop recorder in place. IMPRESSION: Bilateral pleural effusions. Patchy opacity in the right mid, upper and lower lung zone as well  as left lung base has worsened from prior radiograph, suspicious for pneumonia. Electronically Signed   By: Andrea Gasman M.D.   On: 03/21/2024 14:56    Scheduled Meds:  amiodarone   200 mg Per Tube Daily   atorvastatin   80 mg Per Tube Daily   carvedilol   6.25 mg Oral BID WC   Chlorhexidine  Gluconate Cloth  6 each Topical Daily   ezetimibe   10 mg Per Tube Daily   feeding supplement  237 mL Oral BID BM   [name] butt cream   Topical BID   guaiFENesin   10 mL Per Tube Q4H   hydrALAZINE   100 mg Per Tube Q8H   isosorbide  dinitrate  20 mg Per Tube BID   lidocaine   1 patch Transdermal Q24H   losartan   25 mg Per Tube Daily   mouth rinse  15 mL Mouth Rinse 4 times per day   pantoprazole  (PROTONIX ) IV  40 mg Intravenous Q12H   saccharomyces boulardii  250 mg Per Tube BID   sodium chloride  flush  10-40 mL Intracatheter Q12H   sucralfate   1 g Per Tube Q6H   Continuous Infusions:  ampicillin -sulbactam (UNASYN ) IV 3 g (03/22/24 0741)    furosemide      nitroGLYCERIN  30 mcg/min (03/21/24 1700)   promethazine  (PHENERGAN ) injection (IM or IVPB) Stopped (03/18/24 0911)     LOS: 61 days    Time spent: 50 Mins    Darcel Dawley, MD [facility]   If 7PM-7AM, please contact night-coverage

## 2024-03-22 NOTE — Progress Notes (Signed)
 Advanced Heart Failure Rounding Note  Cardiologist: Dorn Lesches, MD  Chief Complaint: S/p esophageal stent. AHF reconsulted 12/5 for A/C HFpEF.  Subjective:   - 10/6: esoph stent placement - 10/15: S/p EGD with stent repositioning by Dr. Shyrl  - 10/23: Ltd echo EF 60-65%  - 10/24: Barium swallow showed persistent leak  - 11/3: PEG tube placed - 11/11: L CT removed - 11/19: SVT in cephalic vein - 11/26: Stent removed. Esophagram (-) for esophageal leak - 11/28: Duodenitis/Enteritis. GI consulted. + bloody emesis.  - 12/2: EDG w/ ischemic ulcers. Carafate  increased + BID PPI - 12/4: PCCM consulted for increased WOB and progressive hypoxia.  - 12/5: AHF re consulted for A/C HFpEF  Has had long, complicated hospitalization. Day: 61  Since 11/10 patient up 27lbs. Diuresis has been started. BNP 2.4K.   Feels much better since yesterday. Weaned down to 4L [place].    Objective:    Weight Range: 82 kg Body mass index is 23.85 kg/m.   Vital Signs:   Temp:  [98 F (36.7 C)-99 F (37.2 C)] 98 F (36.7 C) (12/05 0800) Pulse Rate:  [72-88] 79 (12/05 0800) Resp:  [16-24] 18 (12/05 0800) BP: (136-198)/(53-70) 139/53 (12/05 0800) SpO2:  [91 %-95 %] 91 % (12/05 0800) FiO2 (%):  [40 %] 40 % (12/04 1719) Weight:  [82 kg] 82 kg (12/05 0447) Last BM Date : 03/20/24  Weight change: Filed Weights   03/20/24 0300 03/21/24 0346 03/22/24 0447  Weight: 85.5 kg 83.5 kg 82 kg   Intake/Output:  Intake/Output Summary (Last 24 hours) at 03/22/2024 1129 Last data filed at 03/22/2024 0447 Gross per 24 hour  Intake 1408.41 ml  Output 1075 ml  Net 333.41 ml    Physical Exam   General:  chronically ill appearing.  No respiratory difficulty, +4L [place]: JVD ~14 cm.  Cor: Regular rate & rhythm.  Lungs: diminished bases, R>L Extremities: +1-2 BLE edema to thigh Neuro: alert & oriented x 3. Affect flat.   Telemetry   NSR 70s (Personally reviewed)    Labs    CBC Recent Labs     03/21/24 2057 03/22/24 0600  WBC 19.0* 16.6*  HGB 9.3* 8.7*  HCT 29.5* 28.5*  MCV 87.0 88.8  PLT 194 184   Basic Metabolic Panel Recent Labs    87/96/74 0500 03/21/24 0345 03/22/24 0600 03/22/24 0914  NA 141 142  --  138  K 3.5 4.2  --  4.0  CL 104 107  --  107  CO2 27 27  --  24  GLUCOSE 140* 146*  --  178*  BUN 24* 39*  --  47*  CREATININE 1.60* 1.93*  --  2.16*  CALCIUM  7.9* 7.9*  --  7.5*  MG 2.1 2.3  --   --   PHOS 2.1* 2.2* 2.4*  --    Liver Function Tests Recent Labs    03/20/24 0500 03/21/24 0345  AST 41 31  ALT 82* 68*  ALKPHOS 54 63  BILITOT 0.5 0.5  PROT 4.0* 4.0*  ALBUMIN  <1.5* <1.5*   BNP (last 3 results) Recent Labs    06/27/23 1116 03/22/24 0913  BNP 646.4* 2,404.1*   Hemoglobin A1C No results for input(s): HGBA1C in the last 72 hours.  Fasting Lipid Panel No results for input(s): CHOL, HDL, LDLCALC, TRIG, CHOLHDL, LDLDIRECT in the last 72 hours.   Medications:    Scheduled Medications:  amiodarone   200 mg Per Tube Daily   atorvastatin   80 mg Per Tube Daily   Chlorhexidine  Gluconate Cloth  6 each Topical Daily   ezetimibe   10 mg Per Tube Daily   feeding supplement  237 mL Oral BID BM   [name] butt cream   Topical BID   guaiFENesin   10 mL Per Tube Q4H   hydrALAZINE   100 mg Per Tube Q8H   isosorbide  dinitrate  10 mg Per Tube BID   lidocaine   1 patch Transdermal Q24H   losartan   25 mg Per Tube Daily   metoprolol  tartrate  12.5 mg Per Tube BID   mouth rinse  15 mL Mouth Rinse 4 times per day   pantoprazole  (PROTONIX ) IV  40 mg Intravenous Q12H   saccharomyces boulardii  250 mg Per Tube BID   sodium chloride  flush  10-40 mL Intracatheter Q12H   sucralfate   1 g Per Tube Q6H   torsemide   60 mg Oral Daily    Infusions:  ampicillin -sulbactam (UNASYN ) IV 3 g (03/22/24 0741)   nitroGLYCERIN  30 mcg/min (03/21/24 1700)   promethazine  (PHENERGAN ) injection (IM or IVPB) Stopped (03/18/24 0911)    PRN  Medications: acetaminophen  (TYLENOL ) oral liquid 160 mg/5 mL, alum & mag hydroxide-simeth, cyclobenzaprine , diclofenac  Sodium, [COMPLETED] diphenoxylate -atropine  **FOLLOWED BY** diphenoxylate -atropine , glucagon  (human recombinant), guaiFENesin , HYDROmorphone  (DILAUDID ) injection, levalbuterol , lip balm, mouth rinse, oxyCODONE , promethazine  (PHENERGAN ) injection (IM or IVPB), simethicone , sodium chloride , sodium chloride  flush, traZODone   Patient Profile    Patient with longstanding history of chronic heart failure with preserved ejection fraction, RV failure with history of pulmonary embolism presents with esophageal perforation.  S/P Stent Esophageal Stent. Stent repositioned 10/15. AHF signed off 11/10. Re-consulted 03/22/24 with A/C HFpEF.   Assessment/Plan  1.  Esophageal Perforation - Boerhaave syndrome.  - CT C/A/P: with pneumomediastinum and large mass-like density in lower esophagus - 10/6: EGD with esophageal stent placed  - 10/15: Stent migrated w/ leak > back to OR for repositioning  - 10/24: barium swallow w persistent leak  - 11/3: PEG placed - on TF and meds by PEG. Tolerating full liquid diet - 11/26: Stent removed. Esophagram (-) for esophageal leak - 12/2: EDG w/ ischemic ulcers. Carafate  increased + BID PPI - GI now signed off.   2. Acute on chronic HFpEF: h/o cardiogenic shock with RV failure in the setting of cardiac arrest thought to be caused by acute PE vs ACS in 3/25. Echo at the time showed EF 55%, severe, LVH, G2DD, and severely reduced RV function with strain. There was concern for cardiac sarcoid amyloid, however CMR LGE consistent with ischemic disease. RV on echo 9/25 with complete recovery, EF 60-65%, nl RV function.    - RHC 3/25: Mild PAH with severe RV failure though CO is preserved.   - Repeat echo 10/25 EF 60-65% - Up 27lbs since 11/10. Torsemide  started yesterday. Fair UOP. BNP 2.4K. Will ask RN to set up CVP. Start 120 IV lasix  x1 for now, follow  response.  - Off entresto  with rising Cr - Continue hydral 100 mg tid, unable to use imdur  (cannot be crushed).  - Increase isosorbide  10>20 mg BID - Continue losartan  25 mg daily - Stop lopressor  12.5 mg BID, switch to coreg  6.25 mg BID  - Place UNNA boots  3. 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 Medical management.  - suspect CP related to esophageal issues and not CAD - No s/s angina   4.  AKI on CKD  Stage 3b: Baseline sCr ~ 2.1. - Bumped creatinine to 2.9, now 2.2 - SCR 2.1 today, diuretics as above - avoid hypotension.   5. PAF:   - s/p TEE/DCCV 3/25 - has converted to AF a couple times this admit - continue amio 200 mg daily - Off Eliquis  with recent coffee ground OP from PEG - in NSR on tele    6.  H/o DVT/PE:  Bilateral upper and lower DVTs, mixed acute and chronic.  - s/p infrarenal IVC placement 3/25 - V/Q 3/25 with multiple perfusion defects - off a/c with recent coffee ground OP from PEG   7. Carotid stenosis: h/o CVA. TCAR in 6/24. Okay to remain off plavix  at this point. - carotid US  9/25: widely patent carotid stent on R and no stenosis on L   8. Acute Hypoxic Respiratory Failure/PNA/ Exudative Lt Pleural Effusion  - CT placed for pleural effusion. Resp Cx w/ rare yeast, BCx NGTD  - remains on Ethan - PCCM back on board.  - Plan to follow effusion post diuresis. High risk to tap w/ previous occurrences.  - On Unasyn   9. Severe protein calorie malnutrition - Nutrition following. On TPN qHS - now with PEG tube + TF - tolerating full liquid diet  10. HTN - Now on nitroglycerin  gtt. Would try to wean and titrate PO regimen.  - Continue hydral, isosorbide , losartan , lopressor . Changes as above.   11. Superficial LUE thrombus - Continue PICC - Off Eliquis  with recent coffee ground OP from PEG   Beckey LITTIE Coe, NP 03/22/2024  Advanced Heart Failure Team Pager [phone] (M-F; 7a - 5p)   Please contact [facility] for night-coverage after hours (4p -7a ) and weekends on [email or web address]

## 2024-03-22 NOTE — TOC Progression Note (Addendum)
 Transition of Care [facility]) - Progression Note    Patient Details  Name: Billy [name] MRN: [id number] Date of Birth: 08-18-1950  [facility] CM/SW Contact  Lauraine FORBES Saa, LCSWA Phone Number: 03/22/2024, 9:26 AM  Clinical Narrative:     9:26 AM MD informed medical team that patient is not yet medically ready for discharge to [facility] [place] as he developed pleural effusion and aspiration PNA. CSW will submit SNF insurance authorization when patient is medically ready for discharge. CSW will continue to follow.  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, SNF Pending bed offer               Expected Discharge Plan and Services In-house Referral: Clinical Social Work Discharge Planning Services: [employer] Consult Post Acute Care Choice: Skilled Nursing Facility Living arrangements for the past 2 months: Single Family Home                                       Social Drivers of Health (SDOH) Interventions SDOH Screenings   Food Insecurity: No Food Insecurity (01/22/2024)  Housing: High Risk (01/22/2024)  Transportation Needs: No Transportation Needs (01/22/2024)  Utilities: Not At Risk (01/22/2024)  Depression (PHQ2-9): Low Risk  (12/01/2020)  Social Connections: Socially Isolated (01/22/2024)  Tobacco Use: Low Risk  (03/19/2024)    Readmission Risk Interventions    06/27/2023    1:52 PM 10/14/2022   11:29 AM  Readmission Risk Prevention Plan  Post Dischage Appt  Complete  Medication Screening  Complete  Transportation Screening Complete Complete  HRI or Home Care Consult Complete   Social Work Consult for Recovery Care Planning/Counseling Complete   Palliative Care Screening Not Applicable   Medication Review Oceanographer) Referral to Pharmacy

## 2024-03-23 DIAGNOSIS — K223 Perforation of esophagus: Secondary | ICD-10-CM | POA: Diagnosis not present

## 2024-03-23 LAB — GLUCOSE, CAPILLARY
Glucose-Capillary: 115 mg/dL — ABNORMAL HIGH (ref 70–99)
Glucose-Capillary: 120 mg/dL — ABNORMAL HIGH (ref 70–99)
Glucose-Capillary: 124 mg/dL — ABNORMAL HIGH (ref 70–99)
Glucose-Capillary: 125 mg/dL — ABNORMAL HIGH (ref 70–99)

## 2024-03-23 LAB — PROCALCITONIN: Procalcitonin: 0.61 ng/mL

## 2024-03-23 LAB — CBC
HCT: 27 % — ABNORMAL LOW (ref 39.0–52.0)
Hemoglobin: 8.4 g/dL — ABNORMAL LOW (ref 13.0–17.0)
MCH: 27.3 pg (ref 26.0–34.0)
MCHC: 31.1 g/dL (ref 30.0–36.0)
MCV: 87.7 fL (ref 80.0–100.0)
Platelets: 181 K/uL (ref 150–400)
RBC: 3.08 MIL/uL — ABNORMAL LOW (ref 4.22–5.81)
RDW: 16.8 % — ABNORMAL HIGH (ref 11.5–15.5)
WBC: 14.5 K/uL — ABNORMAL HIGH (ref 4.0–10.5)
nRBC: 0 % (ref 0.0–0.2)

## 2024-03-23 MED ORDER — SODIUM CHLORIDE (PF) 0.9 % IJ SOLN
INTRAMUSCULAR | Status: AC
  Administered 2024-03-23: 10 mL
  Filled 2024-03-23: qty 10

## 2024-03-23 MED ORDER — LOSARTAN POTASSIUM 25 MG PO TABS
37.5000 mg | ORAL_TABLET | Freq: Every day | ORAL | Status: DC
  Administered 2024-03-24: 37.5 mg
  Filled 2024-03-23: qty 2

## 2024-03-23 MED ORDER — SODIUM CHLORIDE 0.9 % IV SOLN
3.0000 g | Freq: Two times a day (BID) | INTRAVENOUS | Status: AC
  Administered 2024-03-23 – 2024-03-25 (×5): 3 g via INTRAVENOUS
  Filled 2024-03-23 (×5): qty 8

## 2024-03-23 MED ORDER — FUROSEMIDE 10 MG/ML IJ SOLN
120.0000 mg | Freq: Two times a day (BID) | INTRAVENOUS | Status: AC
  Administered 2024-03-23 (×2): 120 mg via INTRAVENOUS
  Filled 2024-03-23: qty 120
  Filled 2024-03-23: qty 12

## 2024-03-23 NOTE — Progress Notes (Signed)
 PROGRESS NOTE    Billy [name]  FMW:[id number] DOB: January 27, 1951 DOA: 01/21/2024 PCP: Seabron Lenis, MD    Brief Narrative:  This 73 year old Male with history of cardiac arrest earlier this year, congestive heart failure with reduced EF, A-fib, PE presenting with nausea,  vomiting found to have esophageal rupture/tear requiring stent placement 01/23/2024.  Swallow study revealed additional leak and had stent repositioning 01/31/2024. 10/15 stent repositioned. There was concerns of gastric content aspiration therefore mechanically ventilated in the ICU.  Bedside ultrasound revealed right-sided pleural effusion requiring chest tube placement with removal of 300 cc of exudative fluid. He was also successfully extubated on 10/16. Patient has had increased oxygen  requirement requiring reintubation on 10/20. 10/24 barium swallow showed persistent leak. Eventually, patient was extubated and transferred out of ICU on 10/28. 11/3 PEG tube placed. 11/19 swelling of right arm showing on ultrasound SVT cephalic vein.    Hospital course complicated by acute hypoxic respiratory, PNA, acute on chronic HFpEF for which he is on antibiotics and IV diuresis per advanced heart failure team. PEG tube placed on 11/3.  Dietitian consulted for initiation of tube feed. Taken off TPN as was tolerating tube feed at goal.    11/25 PEG tube placement x-ray shows no extravasation. 11/27 torsemide  resumed for upper extremity edema. 11/28 increased epigastric pain and N/V.  Severe duodenitis on repeat CT.  GI re-consulted.  Made NPO+ice, tube feeds stopped and G-tube placed to gravity drain. 11/29 N/V improved. Still epigastric pain but improved.  Re-trial clear liquids with G tube off drain with TF running.12/1 developed severe epigastric pain with n/v, significant output from G tube initially just tube feeds then coffee-ground color. Also developed melena. Underwent EGD with severe worsened gastroenteritis and multiple ischemic  ulcers.  12/3-Started on full liquid diet today with plans to advance to mech soft diet as tolerated in next 3-5 days per GI. Resumed on TF today, if he tolerated TF and PO, can discontinue TPN.  Assessment & Plan:   Principal Problem:   Esophageal rupture Active Problems:   Pleural effusion   Ventilator dependence (HCC)   Acute respiratory failure with hypoxia (HCC)   Protein-calorie malnutrition, severe   Intractable pain   Pressure injury of skin   Acute on chronic systolic CHF (congestive heart failure) (HCC)   Boerhaave syndrome : Esophageal rupture: - Last esophagram 02/09/24 showed persistent leak. -11/26 esophageal stent removed with no evidence of extravasation on esophagram in OR - Status post esophageal stent removal on 11/26 -repeat esophagogram 12/1 with no leak - CT surgery signed off 12/2  - Continue PPI IV BID - Increased sucralfate  TID to QID - Continue IV dilaudid  0.5mg  q2h PRN - plan to remove PEG tube outpatient. - will need CT surgery follow up outpatient.   Severe gastroenteritis: Ischemic ulcer disease: - repeat CT A/P 12/1 showed persistent severe circumferential thickening of duodenum, and proximal jejunum, small area of free fluid in abdomen and pelvis, small bilateral pleural effusions, postsurgical esophagus findings. - He continues to have severe epigastric pain with nausea and intermittent vomiting. - EGD 12/2 with severe gastritis, multiple non-bleeding superficial duodenal and jejural ischemic ulcers. - Continue IV fluids while unable to tolerate tube feeds, take PO, or off TPN. - ContIinue PPI IV BID, sucralfate , Maalox as needed, Phenergan  IV as needed - plan to restart tube feeds on 12/3 and monitor for tolerance -Started on full liquid diet  with plans to advance to mech soft diet as tolerated in next 3-5 days per  GI.  -Resumed on TF yesterday, if he tolerate TF and PO, can discontinue TPN. - GI signed off 12/3 - dietician following    Normochromic Anemia: Coffee Ground output from PEG: Melena - Related to severe gastroenteritis and peptic ulcer disease. - Hgb 9.5 < 10.5, baseline 8-12 - hold eliquis  for now. - cont PPI IV BID and sucralfate  as elsewhere - transfuse for Hgb goal > 7 - monitor CBC   Lung nodules : - seen on recent chest CT - likely secondary to aspiration /effusion per CT Surgery -- pain control as elsewhere. - monitor clinically    Upper back pain: Musculoskeletal in nature imaging done as above 11/22 nonspecific-overall improved --Continue Flexeril  5 3 times daily as needed --Mobilize    Bilateral UE swelling: -Worse at right upper extremity. -Doppler US  ruled out DVT, Acute superficial thrombosis of cephalic veins noted -Warm compress and Arm elevation --Slightly improved with Lasix . --hold home torsemide  given concerns of dehydration with ongoing PEG output --Elevate arms in bed.   Superficial venous thrombosis : --Monitor --Hold Eliquis  given for now.  --Resume when no signs/symptoms of acute bleed and Hgb stable in the next 48-72 hours - monitor CBC    RUE and b/l thigh ecchymosis and petechiae Initially Noted around PICC site, originally placed 02/05/24. No warmth, tenderness, discharge, induration, fluctuance. Discussed with IV team. Might be a CHG/secure port reaction from not letting it dry and placing the dressing.  Low concern for acute infection, Platelets are within normal limits. Cont local skin care as per IV team recs Monitor clinically   Acute hypoxic respiratory failure: Aspiration Pneumonia Pleural effusion Acute CHF exacerbation: Respiratory failure multifactorial in the setting of pneumonia, pleural effusion, CHF exacerbation.  - Improved with Lasix  and antibiotics. - Continue Xopenex  inhalers  - Mucinex   --hold home torsemide  given concerns of dehydration with ongoing PEG output. - Pulmonology consulted, patient received IV nitroglycerin  and a dose of  Lasix  felt much better. - Advanced heart failure team reconsulted.   - Started on lasix  120 mg bid x 2 doses. - Continue Unasyn  for 5 days.   Paroxysmal A-fib : - DCCV 07/06/2023 - Amiodarone  200mg  daily.  Plan to taper off as able as converted to NSR and depending on next steps with surgery? --metoprolol  as needed IV for heart rate above 110.   History of PE with DVTs: - with IVC filter placed 3/25 - hold Eliquis  5 for now. Resume no signs/symptoms of acute bleed and Hgb stable in the next 48-72 hours - monitor CBC    Hypertension: Hyperlipidemia: - maybe related to intractable pain with nausea and vomiting. - Continue IV labetalol  and IV hydralazine  PRN. - Hydralazine  100 mg Q8H - atorvastatin  80  - Zetia  10  - Isordil  20 thrice daily - Coreg  6.25 mg bid - losartan  25 daily  --Monitor BP and renal function   Bipolar disorder : - overall stable. - Appears not to be on medications.   Insomnia: --Continue trazodone     Protein energy malnutrition BMI 21 --Supplements and vitamins -On PO diet, TF and TPN - f/u  Dietitian for recommendations    CKD stage 3B -- stable . 11/30 - Cr stable & improved 1.97 >> 1.51 >> 1.58 -- Serum creatinine slightly up today 1.93.  Still baseline.   Metabolic acidosis - resolved. --Monitor labs   Multiple bedsores and wounds anterior right and left foot and injury stage I to bottom --Continue Gerhardt twice daily  --Lidoderm  patch for back pain --  Schedule Lomotil  1 tablet BID and QID PRN --Diarrhea overall has improved.   History of Cardiac arrest 06/2023.     DVT prophylaxis:  SCDs Code Status: Full code Family Communication: No family at bed side Disposition Plan:   Status is: Inpatient Remains inpatient appropriate because: Not medically ready.  Advanced heart failure team reconsulted.   Consultants:  PCCM GI  Procedures: As above Antimicrobials:  Anti-infectives (From admission, onward)    Start     Dose/Rate Route  Frequency Ordered Stop   03/21/24 1600  Ampicillin -Sulbactam (UNASYN ) 3 g in sodium chloride  0.9 % 100 mL IVPB        3 g 200 mL/hr over 30 Minutes Intravenous Every 8 hours 03/21/24 1539     02/22/24 1844  fluconazole  (DIFLUCAN ) tablet 200 mg        200 mg Per Tube Daily 02/22/24 1844 03/03/24 1024   02/22/24 1630  fluconazole  (DIFLUCAN ) tablet 200 mg  Status:  Discontinued        200 mg Oral Daily 02/22/24 1537 02/22/24 1844   02/13/24 0900  piperacillin -tazobactam (ZOSYN ) IVPB 3.375 g  Status:  Discontinued        3.375 g 12.5 mL/hr over 240 Minutes Intravenous Every 8 hours 02/13/24 0812 03/20/24 1600   02/07/24 0800  vancomycin  (VANCOCIN ) IVPB 1000 mg/200 mL premix  Status:  Discontinued        1,000 mg 200 mL/hr over 60 Minutes Intravenous Every 24 hours 02/07/24 0632 02/09/24 0905   02/06/24 2200  meropenem  (MERREM ) 1 g in sodium chloride  0.9 % 100 mL IVPB  Status:  Discontinued        1 g 200 mL/hr over 30 Minutes Intravenous Every 12 hours 02/06/24 1127 02/13/24 0812   02/06/24 1300  vancomycin  (VANCOREADY) IVPB 1750 mg/350 mL  Status:  Discontinued        1,750 mg 175 mL/hr over 120 Minutes Intravenous Every 48 hours 02/04/24 1225 02/04/24 1226   02/06/24 0800  vancomycin  (VANCOCIN ) IVPB 1000 mg/200 mL premix        1,000 mg 200 mL/hr over 60 Minutes Intravenous  Once 02/06/24 0629 02/07/24 0829   02/04/24 1400  meropenem  (MERREM ) 1 g in sodium chloride  0.9 % 100 mL IVPB  Status:  Discontinued        1 g 200 mL/hr over 30 Minutes Intravenous Every 8 hours 02/04/24 1208 02/06/24 1127   02/04/24 1300  linezolid  (ZYVOX ) IVPB 600 mg  Status:  Discontinued        600 mg 300 mL/hr over 60 Minutes Intravenous Every 12 hours 02/04/24 1202 02/04/24 1208   02/04/24 1300  vancomycin  (VANCOREADY) IVPB 2000 mg/400 mL        2,000 mg 200 mL/hr over 120 Minutes Intravenous  Once 02/04/24 1208 02/04/24 1623   02/04/24 1208  vancomycin  variable dose per unstable renal function (pharmacist  dosing)  Status:  Discontinued         Does not apply See admin instructions 02/04/24 1208 02/07/24 0632   01/31/24 1030  fluconazole  (DIFLUCAN ) IVPB 200 mg  Status:  Discontinued        200 mg 100 mL/hr over 60 Minutes Intravenous Daily 01/31/24 0938 02/20/24 1218   01/30/24 1445  piperacillin -tazobactam (ZOSYN ) IVPB 3.375 g  Status:  Discontinued        3.375 g 12.5 mL/hr over 240 Minutes Intravenous Every 8 hours 01/30/24 1354 02/04/24 1203   01/26/24 1000  amoxicillin -clavulanate (AUGMENTIN ) 400-57 MG/5ML suspension 875 mg  Status:  Discontinued        875 mg Oral Every 12 hours 01/26/24 0658 01/30/24 1354   01/26/24 1000  fluconazole  (DIFLUCAN ) 40 MG/ML suspension 200 mg  Status:  Discontinued        200 mg Oral Daily 01/26/24 0658 01/31/24 0938   01/22/24 1445  fluconazole  (DIFLUCAN ) IVPB 400 mg  Status:  Discontinued        400 mg 100 mL/hr over 120 Minutes Intravenous Every 24 hours 01/22/24 1436 01/22/24 1439   01/22/24 1445  fluconazole  (DIFLUCAN ) IVPB 200 mg  Status:  Discontinued        200 mg 100 mL/hr over 60 Minutes Intravenous Every 24 hours 01/22/24 1439 01/26/24 0658   01/21/24 2200  piperacillin -tazobactam (ZOSYN ) IVPB 3.375 g  Status:  Discontinued       Placed in Followed by Linked Group   3.375 g 12.5 mL/hr over 240 Minutes Intravenous Every 8 hours 01/21/24 1607 01/26/24 0658   01/21/24 1615  piperacillin -tazobactam (ZOSYN ) IVPB 3.375 g       Placed in Followed by Linked Group   3.375 g 100 mL/hr over 30 Minutes Intravenous  Once 01/21/24 1607 01/21/24 1651      Subjective: Patient was seen and examined at bedside.  Overnight events noted. Patient still reports having shortness of breath.  He still appears swollen. Patient denies any chest pain.  He was started on antibiotics.  Objective: Vitals:   03/22/24 2300 03/23/24 0400 03/23/24 0728 03/23/24 1119  BP: (!) 161/69 (!) 158/66 (!) 162/62 (!) 112/51  Pulse: 77 76 91 72  Resp: (!) 22 (!) 22 (!) 24 20   Temp: 98 F (36.7 C) 98 F (36.7 C) 98.4 F (36.9 C) 97.9 F (36.6 C)  TempSrc: Oral Oral Oral Oral  SpO2: 95% 95% 90% 93%  Weight:  69.3 kg    Height:        Intake/Output Summary (Last 24 hours) at 03/23/2024 1310 Last data filed at 03/23/2024 0400 Gross per 24 hour  Intake 400 ml  Output 1150 ml  Net -750 ml   Filed Weights   03/21/24 0346 03/22/24 0447 03/23/24 0400  Weight: 83.5 kg 82 kg 69.3 kg    Examination:  General exam: Appears calm and comfortable, deconditioned, not in any acute distress, both arms are swollen. Respiratory system: CTA Bilaterally . Respiratory effort normal. RR 16 Cardiovascular system: S1 & S2 heard, RRR. No JVD, murmurs, rubs, gallops or clicks.  Gastrointestinal system: Abdomen is soft, mildly distended and nontender. Normal bowel sounds heard. Central nervous system: Alert and oriented x 3. No focal neurological deficits. Extremities: Both arms and legs are swollen with multiple petechiae noted in both thighs Skin: No rashes, lesions or ulcers Psychiatry: Judgement and insight appear normal. Mood & affect appropriate.   Data Reviewed: I have personally reviewed following labs and imaging studies  CBC: Recent Labs  Lab 03/20/24 1727 03/21/24 0345 03/21/24 2057 03/22/24 0600 03/23/24 0445  WBC 11.0* 11.8* 19.0* 16.6* 14.5*  HGB 9.1* 9.0* 9.3* 8.7* 8.4*  HCT 29.2* 28.9* 29.5* 28.5* 27.0*  MCV 91.3 88.7 87.0 88.8 87.7  PLT 212 202 194 184 181   Basic Metabolic Panel: Recent Labs  Lab 03/18/24 0420 03/19/24 0420 03/19/24 0613 03/20/24 0500 03/21/24 0345 03/22/24 0600 03/22/24 0914  NA 136 137 138 141 142  --  138  K 3.5 3.4* 3.5 3.5 4.2  --  4.0  CL 104 104 108 104 107  --  107  CO2 25 22 23 27 27   --  24  GLUCOSE 132* 547* 150* 140* 146*  --  178*  BUN 32* 25* 27* 24* 39*  --  47*  CREATININE 1.73* 1.65* 1.58* 1.60* 1.93*  --  2.16*  CALCIUM  7.6* 7.4* 7.9* 7.9* 7.9*  --  7.5*  MG  --   --  2.0 2.1 2.3  --   --   PHOS  2.8  --  3.0 2.1* 2.2* 2.4*  --    GFR: Estimated Creatinine Clearance: 30.7 mL/min (A) (by C-G formula based on SCr of 2.16 mg/dL (H)). Liver Function Tests: Recent Labs  Lab 03/20/24 0500 03/21/24 0345  AST 41 31  ALT 82* 68*  ALKPHOS 54 63  BILITOT 0.5 0.5  PROT 4.0* 4.0*  ALBUMIN  <1.5* <1.5*   No results for input(s): LIPASE, AMYLASE in the last 168 hours. No results for input(s): AMMONIA in the last 168 hours. Coagulation Profile: No results for input(s): INR, PROTIME in the last 168 hours. Cardiac Enzymes: No results for input(s): CKTOTAL, CKMB, CKMBINDEX, TROPONINI in the last 168 hours. BNP (last 3 results) No results for input(s): PROBNP in the last 8760 hours. HbA1C: No results for input(s): HGBA1C in the last 72 hours. CBG: Recent Labs  Lab 03/22/24 1136 03/22/24 1741 03/22/24 2350 03/23/24 0555 03/23/24 1203  GLUCAP 120* 120* 102* 115* 124*   Lipid Profile: No results for input(s): CHOL, HDL, LDLCALC, TRIG, CHOLHDL, LDLDIRECT in the last 72 hours. Thyroid  Function Tests: No results for input(s): TSH, T4TOTAL, FREET4, T3FREE, THYROIDAB in the last 72 hours. Anemia Panel: No results for input(s): VITAMINB12, FOLATE, FERRITIN, TIBC, IRON , RETICCTPCT in the last 72 hours. Sepsis Labs: Recent Labs  Lab 03/23/24 0445  PROCALCITON 0.61    No results found for this or any previous visit (from the past 240 hours).   Radiology Studies: DG Chest Port 1 View Result Date: 03/22/2024 EXAM: 1 VIEW(S) XRAY OF THE CHEST 03/22/2024 12:10:00 PM COMPARISON: 03/21/2024 CLINICAL HISTORY: Respiratory failure (HCC) FINDINGS: LINES, TUBES AND DEVICES: Right upper extremity PICC in place with tip in mid superior vena cava. Implanted cardiac monitoring device in left chest. LUNGS AND PLEURA: Worsening patchy airspace opacities in right lung and right pleural effusion. Patchy airspace opacities in left lung base. No  pneumothorax. HEART AND MEDIASTINUM: Aortic arch atherosclerosis. BONES AND SOFT TISSUES: No acute osseous abnormality. IMPRESSION: 1. Worsening patchy airspace opacities in the right lung and right pleural effusion is most concerning for infection. Edema could have a similar appearance . 2. Patchy airspace opacities in the left lung base are stable . 3. Stable small left pleural effusion. Electronically signed by: Lonni Necessary MD 03/22/2024 04:42 PM EST RP Workstation: [id number]    Scheduled Meds:  amiodarone   200 mg Per Tube Daily   atorvastatin   80 mg Per Tube Daily   carvedilol   6.25 mg Oral BID WC   Chlorhexidine  Gluconate Cloth  6 each Topical Daily   ezetimibe   10 mg Per Tube Daily   feeding supplement  237 mL Oral BID BM   [name] butt cream   Topical BID   guaiFENesin   10 mL Per Tube Q4H   hydrALAZINE   100 mg Per Tube Q8H   isosorbide  dinitrate  20 mg Per Tube TID   lidocaine   1 patch Transdermal Q24H   [START ON 03/24/2024] losartan   37.5 mg Per Tube Daily   mouth rinse  15 mL Mouth Rinse 4 times per day   pantoprazole  (  PROTONIX ) IV  40 mg Intravenous Q12H   saccharomyces boulardii  250 mg Per Tube BID   sodium chloride  flush  10-40 mL Intracatheter Q12H   sucralfate   1 g Per Tube Q6H   Continuous Infusions:  ampicillin -sulbactam (UNASYN ) IV 3 g (03/23/24 0828)   furosemide  120 mg (03/23/24 1030)   promethazine  (PHENERGAN ) injection (IM or IVPB) Stopped (03/18/24 0911)     LOS: 62 days    Time spent: 50 Mins    Darcel Dawley, MD [facility]   If 7PM-7AM, please contact night-coverage

## 2024-03-23 NOTE — Progress Notes (Signed)
 Progress Note  Patient Name: Billy [name] Date of Encounter: 03/23/2024 Primary Cardiologist: Dorn Lesches, MD   Subjective   Overnight no change in symptoms Patient notes persistent shortness of breath.  HE can still feel the fluid No CP, Palpitations. O2 requirement has increased.  Vital Signs    Vitals:   03/22/24 2200 03/22/24 2300 03/23/24 0400 03/23/24 0728  BP: (!) 157/60 (!) 161/69 (!) 158/66 (!) 162/62  Pulse: 75 77 76 91  Resp: (!) 24 (!) 22 (!) 22 (!) 24  Temp:  98 F (36.7 C) 98 F (36.7 C) 98.4 F (36.9 C)  TempSrc:  Oral Oral Oral  SpO2: 94% 95% 95% 90%  Weight:   69.3 kg   Height:        Intake/Output Summary (Last 24 hours) at 03/23/2024 0749 Last data filed at 03/23/2024 0400 Gross per 24 hour  Intake 400 ml  Output 1150 ml  Net -750 ml   Filed Weights   03/21/24 0346 03/22/24 0447 03/23/24 0400  Weight: 83.5 kg 82 kg 69.3 kg    Physical Exam   GEN: No acute distress.   Neck: No JVD; CVP is 9 Cardiac: RRR, no murmurs, rubs, or gallops.  Respiratory: Clear to auscultation bilaterally. GI: Soft, nontender, distended  MS: s/p UNNA BOOT  Labs   Telemetry: SR to [facility]   Chemistry Recent Labs  Lab 03/20/24 0500 03/21/24 0345 03/22/24 0914  NA 141 142 138  K 3.5 4.2 4.0  CL 104 107 107  CO2 27 27 24   GLUCOSE 140* 146* 178*  BUN 24* 39* 47*  CREATININE 1.60* 1.93* 2.16*  CALCIUM  7.9* 7.9* 7.5*  PROT 4.0* 4.0*  --   ALBUMIN  <1.5* <1.5*  --   AST 41 31  --   ALT 82* 68*  --   ALKPHOS 54 63  --   BILITOT 0.5 0.5  --   GFRNONAA 46* 37* 32*  ANIONGAP 10 8 7      Hematology Recent Labs  Lab 03/21/24 2057 03/22/24 0600 03/23/24 0445  WBC 19.0* 16.6* 14.5*  RBC 3.39* 3.21* 3.08*  HGB 9.3* 8.7* 8.4*  HCT 29.5* 28.5* 27.0*  MCV 87.0 88.8 87.7  MCH 27.4 27.1 27.3  MCHC 31.5 30.5 31.1  RDW 16.7* 16.8* 16.8*  PLT 194 184 181     BNP Recent Labs  Lab 03/22/24 0913  BNP 2,404.1*     Cardiac Studies   Cardiac Studies &  Procedures   ______________________________________________________________________________________________ CARDIAC CATHETERIZATION  CARDIAC CATHETERIZATION 07/05/2023  Conclusion   1st Mrg lesion is 70% stenosed.   Prox LAD to Mid LAD lesion is 50% stenosed.   Dist LAD lesion is 60% stenosed.   Prox RCA lesion is 100% stenosed.  Findings:  Ao = 125/65 (91) LV = 140/10 RA = 9 RV = 24/16 PA = 40/31 (30) PCW = 14 Fick cardiac output/index = 7.4/3.5 Thermo CO/CI = 7.3/3.5 PVR = 2.2 WU Ao sat = 96% PA sat = 65% PAPi = 1.0  Assessment: 1. 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 Mild PAH with severe RV failure though cardiac output is preserved  Plan/Discussion:  Medical therapy  Toribio Fuel, MD 5:06 PM  Findings Coronary Findings Diagnostic  Dominance: Right  Left Anterior Descending Prox LAD to Mid LAD lesion is 50% stenosed. Dist LAD lesion is 60% stenosed.  Left Circumflex  First Obtuse Marginal Branch 1st Mrg lesion is 70% stenosed.  Right Coronary Artery Prox RCA lesion is 100% stenosed.  Right Posterior Atrioventricular Artery Collaterals RPAV filled by collaterals from Dist LAD.  Intervention  No interventions have been documented.   STRESS TESTS  MYOCARDIAL PERFUSION IMAGING 06/29/2022  Interpretation Summary   Findings are consistent with infarction with peri-infarct ischemia. The study is low risk.   No ST deviation was noted.   Left ventricular function is abnormal. Global function is mildly reduced. End diastolic cavity size is normal.   Prior study available for comparison from 11/22/2018.  Low risk stress nuclear study with mostly fixed defect of old basal inferior-inferolateral infarction with minimal peri-infarct ischemia and mildly reduced left ventricular global systolic function.   ECHOCARDIOGRAM  ECHOCARDIOGRAM LIMITED 02/08/2024  Narrative ECHOCARDIOGRAM  REPORT    Patient Name:   Billy [name] Date of Exam: 02/08/2024 Medical Rec #:  [id number]  Height:       73.0 in Accession #:    [id number] Weight:       182.3 lb Date of Birth:  08-Sep-1950  BSA:          2.069 m Patient Age:    73 years   BP:           150/56 mmHg Patient Gender: M          HR:           77 bpm. Exam Location:  Inpatient  Procedure: Limited Echo, Cardiac Doppler and Color Doppler (Both Spectral and Color Flow Doppler were utilized during procedure).  Indications:    Acute ischemic heart disease, unspecified I24.9  History:        Patient has prior history of Echocardiogram examinations, most recent 02/02/2024. Previous Myocardial Infarction; Risk Factors:Hypertension.  Sonographer:    Jayson Gaskins Referring Phys: ABDULLAHI HUSSEIN  IMPRESSIONS   1. Left ventricular ejection fraction, by estimation, is 60 to 65%. The left ventricle has normal function. The left ventricle has no regional wall motion abnormalities. There is mild concentric left ventricular hypertrophy. Left ventricular diastolic parameters were normal. 2. Right ventricular systolic function is normal. The right ventricular size is normal. 3. The mitral valve is grossly normal. Trivial mitral valve regurgitation. 4. The aortic valve has an indeterminant number of cusps. There is moderate calcification of the aortic valve. Aortic valve regurgitation is not visualized. Aortic valve sclerosis/calcification is present, without any evidence of aortic stenosis. Aortic valve area, by VTI measures 2.29 cm. Aortic valve mean gradient measures 9.0 mmHg.  FINDINGS Left Ventricle: Left ventricular ejection fraction, by estimation, is 60 to 65%. The left ventricle has normal function. The left ventricle has no regional wall motion abnormalities. The left ventricular internal cavity size was normal in size. There is mild concentric left ventricular hypertrophy. Left ventricular diastolic parameters were  normal.  Right Ventricle: The right ventricular size is normal. No increase in right ventricular wall thickness. Right ventricular systolic function is normal.  Left Atrium: Left atrial size was normal in size.  Right Atrium: Right atrial size was normal in size.  Pericardium: There is no evidence of pericardial effusion.  Mitral Valve: The mitral valve is grossly normal. Trivial mitral valve regurgitation. MV peak gradient, 8.5 mmHg. The mean mitral valve gradient is 3.0 mmHg.  Tricuspid Valve: The tricuspid valve is grossly normal. Tricuspid valve regurgitation is mild.  Aortic Valve: The aortic valve has an indeterminant number of cusps. There is moderate calcification of the aortic valve. Aortic valve regurgitation is not visualized. Aortic valve sclerosis/calcification is present, without  any evidence of aortic stenosis. Aortic valve mean gradient measures 9.0 mmHg. Aortic valve peak gradient measures 16.3 mmHg. Aortic valve area, by VTI measures 2.29 cm.  Pulmonic Valve: The pulmonic valve was not well visualized. Pulmonic valve regurgitation is not visualized.  Aorta: The aortic root is normal in size and structure.  IAS/Shunts: No atrial level shunt detected by color flow Doppler.   LEFT VENTRICLE PLAX 2D LVIDd:         4.90 cm   Diastology LVIDs:         2.40 cm   LV e' medial:    12.50 cm/s LV PW:         1.20 cm   LV E/e' medial:  11.4 LV IVS:        1.30 cm   LV e' lateral:   11.20 cm/s LVOT diam:     2.00 cm   LV E/e' lateral: 12.8 LV SV:         84 LV SV Index:   40 LVOT Area:     3.14 cm   RIGHT VENTRICLE RV S prime:     16.60 cm/s  PULMONARY VEINS Diastolic Velocity: 71.20 cm/s S/D Velocity:       0.80 Systolic Velocity:  54.30 cm/s  LEFT ATRIUM             Index LA Vol (A2C):   43.0 ml 20.79 ml/m LA Vol (A4C):   40.4 ml 19.53 ml/m LA Biplane Vol: 44.3 ml 21.41 ml/m AORTIC VALVE AV Area (Vmax):    2.30 cm AV Area (Vmean):   2.50 cm AV Area  (VTI):     2.29 cm AV Vmax:           202.00 cm/s AV Vmean:          137.000 cm/s AV VTI:            0.365 m AV Peak Grad:      16.3 mmHg AV Mean Grad:      9.0 mmHg LVOT Vmax:         148.00 cm/s LVOT Vmean:        109.000 cm/s LVOT VTI:          0.266 m LVOT/AV VTI ratio: 0.73  MITRAL VALVE MV Area (PHT): 2.81 cm     SHUNTS MV Area VTI:   2.45 cm     Systemic VTI:  0.27 m MV Peak grad:  8.5 mmHg     Systemic Diam: 2.00 cm MV Mean grad:  3.0 mmHg MV Vmax:       1.46 m/s MV Vmean:      74.2 cm/s MV Decel Time: 270 msec MV E velocity: 143.00 cm/s MV A velocity: 66.00 cm/s MV E/A ratio:  2.17  Toribio Fuel MD Electronically signed by Toribio Fuel MD Signature Date/Time: 02/08/2024/7:46:06 PM    Final   TEE  ECHO TEE 06/29/2023  Narrative TRANSESOPHOGEAL ECHO REPORT    Patient Name:   Fenix Cavey Date of Exam: 06/29/2023 Medical Rec #:  [id number]  Height:       73.0 in Accession #:    [id number] Weight:       185.4 lb Date of Birth:  01/11/1951  BSA:          2.083 m Patient Age:    72 years   BP:           156/59 mmHg Patient Gender: M  HR:           88 bpm. Exam Location:  Inpatient  Procedure: Limited Echo, Color Doppler and Transesophageal Echo (Both Spectral and Color Flow Doppler were utilized during procedure).  Indications:    Cardioversion  History:        Patient has prior history of Echocardiogram examinations, most recent 06/27/2023.  Sonographer:    Tinnie Gosling RDCS Referring Phys: [id number] JORDAN LEE  PROCEDURE: The transesophogeal probe was passed without difficulty through the esophogus of the patient. Sedation performed by different physician. The patient developed no complications during the procedure.  IMPRESSIONS   1. Left ventricular ejection fraction, by estimation, is 55%. The left ventricle has normal function. The left ventricle has no regional wall motion abnormalities. There is severe concentric left ventricular  hypertrophy. 2. Right ventricular systolic function is mildly reduced. The right ventricular size is normal. 3. Left atrial size was mildly dilated. No left atrial/left atrial appendage thrombus was detected. 4. Right atrial size was mildly dilated. 5. No PFO or ASD by color doppler. 6. The mitral valve is normal in structure. Trivial mitral valve regurgitation. No evidence of mitral stenosis. 7. The aortic valve is tricuspid. Aortic valve regurgitation is not visualized. No aortic stenosis is present. 8. The inferior vena cava is normal in size with greater than 50% respiratory variability, suggesting right atrial pressure of 3 mmHg.  FINDINGS Left Ventricle: Left ventricular ejection fraction, by estimation, is 55%. The left ventricle has normal function. The left ventricle has no regional wall motion abnormalities. The left ventricular internal cavity size was normal in size. There is severe concentric left ventricular hypertrophy.  Right Ventricle: The right ventricular size is normal. No increase in right ventricular wall thickness. Right ventricular systolic function is mildly reduced.  Left Atrium: Left atrial size was mildly dilated. No left atrial/left atrial appendage thrombus was detected.  Right Atrium: Right atrial size was mildly dilated.  Pericardium: There is no evidence of pericardial effusion.  Mitral Valve: The mitral valve is normal in structure. Trivial mitral valve regurgitation. No evidence of mitral valve stenosis.  Tricuspid Valve: The tricuspid valve is normal in structure. Tricuspid valve regurgitation is trivial.  Aortic Valve: The aortic valve is tricuspid. Aortic valve regurgitation is not visualized. No aortic stenosis is present.  Pulmonic Valve: The pulmonic valve was normal in structure. Pulmonic valve regurgitation is not visualized.  Aorta: The aortic root is normal in size and structure.  Venous: The inferior vena cava is normal in size with greater  than 50% respiratory variability, suggesting right atrial pressure of 3 mmHg.  IAS/Shunts: No PFO or ASD by color doppler.   IVC IVC diam: 1.30 cm  Dalton McleanMD Electronically signed by Ezra Kanner Signature Date/Time: 06/29/2023/8:33:40 PM    Final  MONITORS  LONG TERM MONITOR (3-14 DAYS) 12/06/2023  Narrative Patch Wear Time:  13 days and 23 hours (2025-07-27T17:07:22-0400 to 2025-08-10T17:07:09-398)  Patient had a min HR of 45 bpm, max HR of 113 bpm, and avg HR of 64 bpm. Predominant underlying rhythm was Sinus Rhythm. 1 run of Supraventricular Tachycardia occurred lasting 4 beats with a max rate of 111 bpm (avg 104 bpm). Isolated SVEs were occasional (1.6%, 20212), SVE Couplets were rare (<1.0%, 2196), and no SVE Triplets were present. Isolated VEs were occasional (3.1%, 40096), VE Couplets were rare (<1.0%, 46), and no VE Triplets were present. Ventricular Bigeminy and Trigeminy were present.  SR/SB/ST Short runs SVT Occasional PACs/PVCs     CARDIAC MRI  MR CARDIAC MORPHOLOGY W WO CONTRAST 07/07/2023  Narrative CLINICAL DATA:  Clinical question of cardiac amyloidosis.  EXAM: CARDIAC MRI  TECHNIQUE: The patient was scanned on a 1.5 Tesla GE magnet. A dedicated cardiac coil was used. Functional imaging was done using Fiesta sequences. 2,3, and 4 chamber views were done to assess for RWMA's. Modified Simpson's rule using a short axis stack was used to calculate an ejection fraction on a dedicated work [employer]. The patient received 10 cc of Gadavist . After 10 minutes inversion recovery sequences were used to assess for infiltration and scar tissue. Flow quantification was performed 2 times during this examination with flow quantification performed at the levels of the ascending aorta above the valve, pulmonary artery above the valve.  CONTRAST:  10 cc  of Gadavist   FINDINGS: 1. Mild left ventricular dilation, with LVEDD 52 mm, but  LVEDVi 91 mL/m2.  Severe septal hypertrophy 15 mm. No systolic anterior motion of the mitral valve. Papillary muscle hypertrophy.  Low normal left ventricular systolic function (LVEF =52%). Basal and mid inferolateral hypokinesis and thinning of myocardium (without notable scar).  Left ventricular parametric mapping notable for normal T2.  ECV elevated, severely (60%) in the mid- inferolateral wall at worst.  There is no late gadolinium enhancement in the left ventricular myocardium. Six standard deviation used.  2.  Dilated right ventricle, with RVEDVI 95 mL/m2.  Normal right ventricular thickness.  Normal right ventricular systolic function (RVEF =53%).  3. Mild left atrial dilation. Severe right atrial dilation, maximal right atrial indexed volume 79.85 ml/m2.  4. Normal size of the aortic root, ascending aorta. Mild main pulmonary artery dilation, 28 mm.  5. Valve assessment:  Aortic Valve: Tri-leaflet aortic valve. Qualitatively there is no significant regurgitation. Regurgitant fraction 2%. Gradient 2 mm Hg.  Pulmonic Valve: Qualitatively, there is no significant regurgitation. Regurgitant fraction 1%.  Tricuspid Valve: Unclear morphology. Moderate regurgitation. Regurgitant fraction 22%.  Mitral Valve: Mild regurgitation. Regurgitant fraction 12%.  6.  Normal pericardium.  No pericardial effusion.  7. Small bilateral pleural effusions. Bilateral patchy ground-glass opacification of the aerated lung fields, most prominent on the right side. Recommended dedicated study if concerned for non-cardiac pathology.  IMPRESSION: 1. Study is less consistent with cardiac amyloidosis or infiltrative disease. Study is more consistent with inferolateral territory RCA or LCX disease (with ECV elevation but without notable scar). Though septum is 15 mm, there are no other class features consistent with hypertrophic cardiomyopathy.  2.  Low normal LVEF (52%) with mild  LV dilation.  3.  Right ventricular dilation with normalization of function.  4. Severe right atrial dilation. Mild pulmonary artery dilation. Moderate tricuspid regurgitation.  Stanly Leavens MD   Electronically Signed By: Stanly Leavens M.D. 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      Stanly Leavens, MD FASE [facility] Cardiologist [facility]  [address] [place], [address] [place], [place] [address] [phone]  7:49 AM

## 2024-03-23 NOTE — Progress Notes (Signed)
   03/23/24 2100  BiPAP/CPAP/SIPAP  Reason BIPAP/CPAP not in use Non-compliant (Pt. refused. Educated pt. on reasons to wear it.)

## 2024-03-23 NOTE — Plan of Care (Signed)
  Problem: Clinical Measurements: Goal: Respiratory complications will improve Outcome: Progressing Goal: Cardiovascular complication will be avoided Outcome: Progressing   Problem: Coping: Goal: Level of anxiety will decrease Outcome: Progressing   

## 2024-03-24 ENCOUNTER — Inpatient Hospital Stay (HOSPITAL_COMMUNITY)

## 2024-03-24 DIAGNOSIS — K449 Diaphragmatic hernia without obstruction or gangrene: Secondary | ICD-10-CM | POA: Diagnosis not present

## 2024-03-24 DIAGNOSIS — K2980 Duodenitis without bleeding: Secondary | ICD-10-CM | POA: Diagnosis not present

## 2024-03-24 DIAGNOSIS — R109 Unspecified abdominal pain: Secondary | ICD-10-CM | POA: Diagnosis not present

## 2024-03-24 DIAGNOSIS — K529 Noninfective gastroenteritis and colitis, unspecified: Secondary | ICD-10-CM | POA: Diagnosis not present

## 2024-03-24 LAB — GLUCOSE, CAPILLARY
Glucose-Capillary: 121 mg/dL — ABNORMAL HIGH (ref 70–99)
Glucose-Capillary: 153 mg/dL — ABNORMAL HIGH (ref 70–99)
Glucose-Capillary: 126 mg/dL — ABNORMAL HIGH (ref 70–99)

## 2024-03-24 LAB — CBC
HCT: 29.3 % — ABNORMAL LOW (ref 39.0–52.0)
Hemoglobin: 9.1 g/dL — ABNORMAL LOW (ref 13.0–17.0)
MCH: 26.9 pg (ref 26.0–34.0)
MCHC: 31.1 g/dL (ref 30.0–36.0)
MCV: 86.7 fL (ref 80.0–100.0)
Platelets: 195 K/uL (ref 150–400)
RBC: 3.38 MIL/uL — ABNORMAL LOW (ref 4.22–5.81)
RDW: 16.6 % — ABNORMAL HIGH (ref 11.5–15.5)
WBC: 14.3 K/uL — ABNORMAL HIGH (ref 4.0–10.5)
nRBC: 0 % (ref 0.0–0.2)

## 2024-03-24 LAB — VITAMIN K1, SERUM: VITAMIN K1: 0.49 ng/mL (ref 0.10–2.20)

## 2024-03-24 LAB — BASIC METABOLIC PANEL WITH GFR
Anion gap: 13 (ref 5–15)
BUN: 61 mg/dL — ABNORMAL HIGH (ref 8–23)
CO2: 25 mmol/L (ref 22–32)
Calcium: 7.6 mg/dL — ABNORMAL LOW (ref 8.9–10.3)
Chloride: 103 mmol/L (ref 98–111)
Creatinine, Ser: 2.52 mg/dL — ABNORMAL HIGH (ref 0.61–1.24)
GFR, Estimated: 27 mL/min — ABNORMAL LOW (ref >60)
Glucose, Bld: 114 mg/dL — ABNORMAL HIGH (ref 70–99)
Potassium: 3.9 mmol/L (ref 3.5–5.1)
Sodium: 141 mmol/L (ref 135–145)

## 2024-03-24 LAB — PROCALCITONIN: Procalcitonin: 0.79 ng/mL

## 2024-03-24 LAB — MAGNESIUM: Magnesium: 2.4 mg/dL (ref 1.7–2.4)

## 2024-03-24 LAB — PHOSPHORUS: Phosphorus: 3.2 mg/dL (ref 2.5–4.6)

## 2024-03-24 LAB — ALBUMIN: Albumin: <1.5 g/dL — ABNORMAL LOW (ref 3.5–5.0)

## 2024-03-24 MED ORDER — IOHEXOL 9 MG/ML PO SOLN
500.0000 mL | ORAL | Status: AC
  Administered 2024-03-24 (×2): 500 mL via ORAL

## 2024-03-24 MED ORDER — HYDROMORPHONE HCL 1 MG/ML IJ SOLN
0.5000 mg | INTRAMUSCULAR | Status: DC | PRN
  Administered 2024-03-25: 0.5 mg via INTRAVENOUS
  Filled 2024-03-24 (×3): qty 0.5

## 2024-03-24 MED ORDER — IOHEXOL 9 MG/ML PO SOLN
ORAL | Status: AC
  Filled 2024-03-24: qty 1000

## 2024-03-24 MED ORDER — SODIUM CHLORIDE (PF) 0.9 % IJ SOLN
INTRAMUSCULAR | Status: AC
  Administered 2024-03-24: 10 mL
  Filled 2024-03-24: qty 10

## 2024-03-24 MED ORDER — HYDROMORPHONE HCL 1 MG/ML IJ SOLN
1.0000 mg | INTRAMUSCULAR | Status: DC | PRN
  Administered 2024-03-24 – 2024-03-27 (×26): 1 mg via INTRAVENOUS
  Filled 2024-03-24 (×26): qty 1

## 2024-03-24 NOTE — Progress Notes (Addendum)
 PROGRESS NOTE    Billy [name]  FMW:[id number] DOB: 22-Mar-1951 DOA: 01/21/2024 PCP: Seabron Lenis, MD  Chief Complaint  Patient presents with   Abdominal Pain   Emesis    Brief Narrative:   This 73 year old Male with history of cardiac arrest earlier this year, congestive heart failure with reduced EF, Natarsha Hurwitz-fib, PE presenting with nausea,  vomiting found to have esophageal rupture/tear requiring stent placement 01/23/2024.  Swallow study revealed additional leak and had stent repositioning 01/31/2024. 10/15 stent repositioned. There was concerns of gastric content aspiration therefore mechanically ventilated in the ICU.  Bedside ultrasound revealed right-sided pleural effusion requiring chest tube placement with removal of 300 cc of exudative fluid. He was also successfully extubated on 10/16. Patient has had increased oxygen  requirement requiring reintubation on 10/20. 10/24 barium swallow showed persistent leak. Eventually, patient was extubated and transferred out of ICU on 10/28. 11/3 PEG tube placed. 11/19 swelling of right arm showing on ultrasound SVT cephalic vein.    Hospital course complicated by acute hypoxic respiratory, PNA, acute on chronic HFpEF for which he is on antibiotics and IV diuresis per advanced heart failure team. PEG tube placed on 11/3.  Dietitian consulted for initiation of tube feed. Taken off TPN as was tolerating tube feed at goal.    11/25 PEG tube placement x-ray shows no extravasation. 11/27 torsemide  resumed for upper extremity edema. 11/28 increased epigastric pain and N/V.  Severe duodenitis on repeat CT.  GI re-consulted.  Made NPO+ice, tube feeds stopped and G-tube placed to gravity drain. 11/29 N/V improved. Still epigastric pain but improved.  Re-trial clear liquids with G tube off drain with TF running.12/1 developed severe epigastric pain with n/v, significant output from G tube initially just tube feeds then coffee-ground color. Also developed melena. Underwent  EGD with severe worsened gastroenteritis and multiple ischemic ulcers.   12/3-Started on full liquid diet today with plans to advance to mech soft diet as tolerated in next 3-5 days per GI. Resumed on TF today, if he tolerated TF and PO, can discontinue TPN.  Assessment & Plan:   Principal Problem:   Esophageal rupture Active Problems:   Pleural effusion   Ventilator dependence (HCC)   Acute respiratory failure with hypoxia (HCC)   Protein-calorie malnutrition, severe   Intractable pain   Pressure injury of skin   Acute on chronic systolic CHF (congestive heart failure) (HCC)  Boerhaave syndrome : Esophageal rupture: - Last esophagram 02/09/24 showed persistent leak. -11/26 esophageal stent removed with no evidence of extravasation on esophagram in OR - Status post esophageal stent removal on 11/26 - repeat esophagogram 12/1 with no leak - CT surgery signed off 12/2  - Continue PPI IV BID, sucralfate  - Continue IV dilaudid  0.5mg  q2h PRN - plan to remove PEG tube outpatient. - will need CT surgery follow up outpatient. - concern for abdominal pain today, follow CT abd pelvis   Severe gastroenteritis: Ischemic ulcer disease: - repeat CT Taray Normoyle/P 12/1 showed persistent severe circumferential thickening of duodenum, and proximal jejunum, small area of free fluid in abdomen and pelvis, small bilateral pleural effusions, postsurgical esophagus findings. - He continues to have severe epigastric pain with nausea and intermittent vomiting. - EGD 12/2 with severe gastritis, multiple non-bleeding superficial duodenal and jejural ischemic ulcers. - GI recommending indefinite PPI, sucralfate  1 gram QID x 2 months -Started on full liquid diet  with plans to advance to mech soft diet as tolerated in next 3-5 days per GI.  -Resumed on TF yesterday, if  he tolerate TF and PO, can discontinue TPN. - GI signed off 12/3 - dietician following   Normochromic Anemia: Coffee Ground output from  PEG: Melena - Related to severe gastroenteritis and peptic ulcer disease. - Hgb 9.5 < 10.5, baseline 8-12 - hold eliquis  for now. - cont PPI IV BID and sucralfate  as elsewhere - transfuse for Hgb goal > 7 - monitor CBC   AKI on CKD stage 3B Creatinine rising in setting of diuresis Received contrast 12/1 Follow UA CT 12/1 without hydro Hold losartan  Diuresis per cards, on hold today  Acute hypoxic respiratory failure: Aspiration Pneumonia Pleural effusion Acute CHF exacerbation: Respiratory failure multifactorial in the setting of pneumonia, pleural effusion, CHF exacerbation.  - Improved with Lasix  and antibiotics. - pulmonary now signed off, recommending aggressive diuresis (they've signed off 12/5) - appreciate cardiology assistance with diuresis (on hold with AKI today) - Continue Unasyn  for 5 days. - strict I/O, daily weights  Protein energy malnutrition  S/p PEG On full liquid diet   Bilateral UE swelling Superficial venous thrombosis : -Doppler US  ruled out DVT, Acute superficial thrombosis of cephalic veins noted - symptomatic management   RUE and b/l thigh ecchymosis and petechiae Initially Noted around PICC site, originally placed 02/05/24. No warmth, tenderness, discharge, induration, fluctuance. Discussed with IV team. Might be Denaya Horn CHG/secure port reaction from not letting it dry and placing the dressing.  Low concern for acute infection, Platelets are within normal limits. Cont local skin care as per IV team recs Monitor clinically   Paroxysmal Andrews Tener-fib : - DCCV 07/06/2023 - Amiodarone  200mg  daily.  - eliquis  is currently on hold as below - appreciate cards assistance   History of PE with DVTs: - with IVC filter placed 3/25 - hold Eliquis  5 for now - monitor CBC    Hypertension: Hyperlipidemia: - maybe related to intractable pain with nausea and vomiting. - Continue IV labetalol  and IV hydralazine  PRN. - Hydralazine , isordil , coreg  - losartan  on hold  with AKI - atorvastatin  80  - Zetia  10    Bipolar disorder : - overall stable. - Appears not to be on medications.   Insomnia: --Continue trazodone     Metabolic acidosis - resolved. --Monitor labs   Wounds --Continue Gerhardt twice daily  --Schedule Lomotil  1 tablet BID and QID PRN --Diarrhea overall has improved.   Upper back pain: Musculoskeletal in nature imaging done as above 11/22 nonspecific-overall improved --Continue Flexeril  5 3 times daily as needed --Mobilize   Lung nodules : - multiple nodular densities in R lung - needs outpatient follow up   History of Cardiac arrest 06/2023.     DVT prophylaxis: SCD Code Status: full Family Communication: none - called son [name], no answer 12/7 Disposition:   Status is: Inpatient Remains inpatient appropriate because: need for ongoing inpatient care   Consultants:  Cardiology PCCM GI CT surgery  Procedures:  12/2 EGD  11/26 Procedure: - Esophagogastroscopy - Removal of esophageal stent - esophagram with Omnipaque    11/03 Procedure: - Esophagogastroscopy - 46F Percutaneous gastroscopy tube placement  10/20 Chest tube intubation  10/16  Chest tube  10/15 Procedure: - Esophagogastroscopy - adjustment of previous stent  10/6 Procedure: - Esophagogastroscopy - 125 mm mm covered esophageal stent placement   Antimicrobials:  Anti-infectives (From admission, onward)    Start     Dose/Rate Route Frequency Ordered Stop   03/23/24 2000  Ampicillin -Sulbactam (UNASYN ) 3 g in sodium chloride  0.9 % 100 mL IVPB  3 g 200 mL/hr over 30 Minutes Intravenous Every 12 hours 03/23/24 1400 03/26/24 0759   03/21/24 1600  Ampicillin -Sulbactam (UNASYN ) 3 g in sodium chloride  0.9 % 100 mL IVPB  Status:  Discontinued        3 g 200 mL/hr over 30 Minutes Intravenous Every 8 hours 03/21/24 1539 03/23/24 1400   02/22/24 1844  fluconazole  (DIFLUCAN ) tablet 200 mg        200 mg Per Tube Daily 02/22/24 1844  03/03/24 1024   02/22/24 1630  fluconazole  (DIFLUCAN ) tablet 200 mg  Status:  Discontinued        200 mg Oral Daily 02/22/24 1537 02/22/24 1844   02/13/24 0900  piperacillin -tazobactam (ZOSYN ) IVPB 3.375 g  Status:  Discontinued        3.375 g 12.5 mL/hr over 240 Minutes Intravenous Every 8 hours 02/13/24 0812 03/20/24 1600   02/07/24 0800  vancomycin  (VANCOCIN ) IVPB 1000 mg/200 mL premix  Status:  Discontinued        1,000 mg 200 mL/hr over 60 Minutes Intravenous Every 24 hours 02/07/24 0632 02/09/24 0905   02/06/24 2200  meropenem  (MERREM ) 1 g in sodium chloride  0.9 % 100 mL IVPB  Status:  Discontinued        1 g 200 mL/hr over 30 Minutes Intravenous Every 12 hours 02/06/24 1127 02/13/24 0812   02/06/24 1300  vancomycin  (VANCOREADY) IVPB 1750 mg/350 mL  Status:  Discontinued        1,750 mg 175 mL/hr over 120 Minutes Intravenous Every 48 hours 02/04/24 1225 02/04/24 1226   02/06/24 0800  vancomycin  (VANCOCIN ) IVPB 1000 mg/200 mL premix        1,000 mg 200 mL/hr over 60 Minutes Intravenous  Once 02/06/24 0629 02/07/24 0829   02/04/24 1400  meropenem  (MERREM ) 1 g in sodium chloride  0.9 % 100 mL IVPB  Status:  Discontinued        1 g 200 mL/hr over 30 Minutes Intravenous Every 8 hours 02/04/24 1208 02/06/24 1127   02/04/24 1300  linezolid  (ZYVOX ) IVPB 600 mg  Status:  Discontinued        600 mg 300 mL/hr over 60 Minutes Intravenous Every 12 hours 02/04/24 1202 02/04/24 1208   02/04/24 1300  vancomycin  (VANCOREADY) IVPB 2000 mg/400 mL        2,000 mg 200 mL/hr over 120 Minutes Intravenous  Once 02/04/24 1208 02/04/24 1623   02/04/24 1208  vancomycin  variable dose per unstable renal function (pharmacist dosing)  Status:  Discontinued         Does not apply See admin instructions 02/04/24 1208 02/07/24 0632   01/31/24 1030  fluconazole  (DIFLUCAN ) IVPB 200 mg  Status:  Discontinued        200 mg 100 mL/hr over 60 Minutes Intravenous Daily 01/31/24 0938 02/20/24 1218   01/30/24 1445   piperacillin -tazobactam (ZOSYN ) IVPB 3.375 g  Status:  Discontinued        3.375 g 12.5 mL/hr over 240 Minutes Intravenous Every 8 hours 01/30/24 1354 02/04/24 1203   01/26/24 1000  amoxicillin -clavulanate (AUGMENTIN ) 400-57 MG/5ML suspension 875 mg  Status:  Discontinued        875 mg Oral Every 12 hours 01/26/24 0658 01/30/24 1354   01/26/24 1000  fluconazole  (DIFLUCAN ) 40 MG/ML suspension 200 mg  Status:  Discontinued        200 mg Oral Daily 01/26/24 0658 01/31/24 0938   01/22/24 1445  fluconazole  (DIFLUCAN ) IVPB 400 mg  Status:  Discontinued  400 mg 100 mL/hr over 120 Minutes Intravenous Every 24 hours 01/22/24 1436 01/22/24 1439   01/22/24 1445  fluconazole  (DIFLUCAN ) IVPB 200 mg  Status:  Discontinued        200 mg 100 mL/hr over 60 Minutes Intravenous Every 24 hours 01/22/24 1439 01/26/24 0658   01/21/24 2200  piperacillin -tazobactam (ZOSYN ) IVPB 3.375 g  Status:  Discontinued       Placed in Followed by Linked Group   3.375 g 12.5 mL/hr over 240 Minutes Intravenous Every 8 hours 01/21/24 1607 01/26/24 0658   01/21/24 1615  piperacillin -tazobactam (ZOSYN ) IVPB 3.375 g       Placed in Followed by Linked Group   3.375 g 100 mL/hr over 30 Minutes Intravenous  Once 01/21/24 1607 01/21/24 1651       Subjective: C/o abdominal pain When asked if it's better, worse, or the same - not able to clearly describe - just that it's hurting  Objective: Vitals:   03/23/24 2352 03/24/24 0343 03/24/24 0822 03/24/24 1130  BP: (!) 169/63 (!) 170/67 (!) 149/65 138/61  Pulse: 81 83 74 81  Resp: (!) 27 (!) 23 20 20   Temp: 98.3 F (36.8 C) 98.3 F (36.8 C) 98 F (36.7 C) 98.6 F (37 C)  TempSrc: Oral Oral Oral Oral  SpO2: 92% 91% 95% 94%  Weight:  69.3 kg    Height:        Intake/Output Summary (Last 24 hours) at 03/24/2024 1435 Last data filed at 03/24/2024 1200 Gross per 24 hour  Intake 512 ml  Output 1150 ml  Net -638 ml   Filed Weights   03/22/24 0447 03/23/24 0400  03/24/24 0343  Weight: 82 kg 69.3 kg 69.3 kg    Examination:  General exam: Appears calm and comfortable  Respiratory system: unlabored, CTAB Cardiovascular system: RRR Gastrointestinal system: diffuse TTP Central nervous system: Alert and oriented. No focal neurological deficits. Extremities: diffuse anasarca, bilateral LE unna boots   Data Reviewed: I have personally reviewed following labs and imaging studies  CBC: Recent Labs  Lab 03/21/24 0345 03/21/24 2057 03/22/24 0600 03/23/24 0445 03/24/24 0508  WBC 11.8* 19.0* 16.6* 14.5* 14.3*  HGB 9.0* 9.3* 8.7* 8.4* 9.1*  HCT 28.9* 29.5* 28.5* 27.0* 29.3*  MCV 88.7 87.0 88.8 87.7 86.7  PLT 202 194 184 181 195    Basic Metabolic Panel: Recent Labs  Lab 03/19/24 0613 03/20/24 0500 03/21/24 0345 03/22/24 0600 03/22/24 0914 03/24/24 0508  NA 138 141 142  --  138 141  K 3.5 3.5 4.2  --  4.0 3.9  CL 108 104 107  --  107 103  CO2 23 27 27   --  24 25  GLUCOSE 150* 140* 146*  --  178* 114*  BUN 27* 24* 39*  --  47* 61*  CREATININE 1.58* 1.60* 1.93*  --  2.16* 2.52*  CALCIUM  7.9* 7.9* 7.9*  --  7.5* 7.6*  MG 2.0 2.1 2.3  --   --  2.4  PHOS 3.0 2.1* 2.2* 2.4*  --  3.2    GFR: Estimated Creatinine Clearance: 26.4 mL/min (Veronia Laprise) (by C-G formula based on SCr of 2.52 mg/dL (H)).  Liver Function Tests: Recent Labs  Lab 03/20/24 0500 03/21/24 0345  AST 41 31  ALT 82* 68*  ALKPHOS 54 63  BILITOT 0.5 0.5  PROT 4.0* 4.0*  ALBUMIN  <1.5* <1.5*    CBG: Recent Labs  Lab 03/23/24 1203 03/23/24 1805 03/23/24 2355 03/24/24 0558 03/24/24 1132  GLUCAP 124*  125* 120* 121* 153*     No results found for this or any previous visit (from the past 240 hours).       Radiology Studies: No results found.      Scheduled Meds:  amiodarone   200 mg Per Tube Daily   atorvastatin   80 mg Per Tube Daily   carvedilol   6.25 mg Oral BID WC   Chlorhexidine  Gluconate Cloth  6 each Topical Daily   ezetimibe   10 mg Per Tube Daily    feeding supplement  237 mL Oral BID BM   [name] butt cream   Topical BID   guaiFENesin   10 mL Per Tube Q4H   hydrALAZINE   100 mg Per Tube Q8H   isosorbide  dinitrate  20 mg Per Tube TID   lidocaine   1 patch Transdermal Q24H   losartan   37.5 mg Per Tube Daily   mouth rinse  15 mL Mouth Rinse 4 times per day   pantoprazole  (PROTONIX ) IV  40 mg Intravenous Q12H   saccharomyces boulardii  250 mg Per Tube BID   sodium chloride  flush  10-40 mL Intracatheter Q12H   sucralfate   1 g Per Tube Q6H   Continuous Infusions:  ampicillin -sulbactam (UNASYN ) IV 3 g (03/24/24 0958)   promethazine  (PHENERGAN ) injection (IM or IVPB) Stopped (03/18/24 0911)     LOS: 63 days    Time spent: over 30 min     Meliton Monte, MD [facility]   To contact the attending provider between 7A-7P or the covering provider during after hours 7P-7A, please log into the web site [email or web address] and access using universal [place] password for that web site. If you do not have the password, please call the hospital operator.  03/24/2024, 2:35 PM

## 2024-03-24 NOTE — Progress Notes (Signed)
 Progress Note  Patient Name: Isidore Margraf Date of Encounter: 03/24/2024 Primary Cardiologist: Dorn Lesches, MD   Subjective   Notes improvement in shortness of breath. Notes worsening abdominal pain- notes he is pending a special medication for this.  Vital Signs    Vitals:   03/23/24 2352 03/24/24 0343 03/24/24 0822 03/24/24 1130  BP: (!) 169/63 (!) 170/67 (!) 149/65 138/61  Pulse: 81 83 74 81  Resp: (!) 27 (!) 23 20 20   Temp: 98.3 F (36.8 C) 98.3 F (36.8 C) 98 F (36.7 C) 98.6 F (37 C)  TempSrc: Oral Oral Oral Oral  SpO2: 92% 91% 95% 94%  Weight:  69.3 kg    Height:        Intake/Output Summary (Last 24 hours) at 03/24/2024 1203 Last data filed at 03/24/2024 0900 Gross per 24 hour  Intake 402 ml  Output 1150 ml  Net -748 ml   Filed Weights   03/22/24 0447 03/23/24 0400 03/24/24 0343  Weight: 82 kg 69.3 kg 69.3 kg    Physical Exam   GEN: No acute distress.   Neck: No JVD Cardiac: RRR, no murmurs, rubs, or gallops.  Respiratory: Clear to auscultation bilaterally. GI: Soft, nontender, distended  MS: s/p UNNA BOOT  Labs   Telemetry: SR to [facility]   Chemistry Recent Labs  Lab 03/20/24 0500 03/21/24 0345 03/22/24 0914 03/24/24 0508  NA 141 142 138 141  K 3.5 4.2 4.0 3.9  CL 104 107 107 103  CO2 27 27 24 25   GLUCOSE 140* 146* 178* 114*  BUN 24* 39* 47* 61*  CREATININE 1.60* 1.93* 2.16* 2.52*  CALCIUM  7.9* 7.9* 7.5* 7.6*  PROT 4.0* 4.0*  --   --   ALBUMIN  <1.5* <1.5*  --   --   AST 41 31  --   --   ALT 82* 68*  --   --   ALKPHOS 54 63  --   --   BILITOT 0.5 0.5  --   --   GFRNONAA 46* 37* 32* 27*  ANIONGAP 10 8 7 13      Hematology Recent Labs  Lab 03/22/24 0600 03/23/24 0445 03/24/24 0508  WBC 16.6* 14.5* 14.3*  RBC 3.21* 3.08* 3.38*  HGB 8.7* 8.4* 9.1*  HCT 28.5* 27.0* 29.3*  MCV 88.8 87.7 86.7  MCH 27.1 27.3 26.9  MCHC 30.5 31.1 31.1  RDW 16.8* 16.8* 16.6*  PLT 184 181 195     BNP Recent Labs  Lab 03/22/24 0913  BNP  2,404.1*     Cardiac Studies   Cardiac Studies & Procedures   ______________________________________________________________________________________________ CARDIAC CATHETERIZATION  CARDIAC CATHETERIZATION 07/05/2023  Conclusion   1st Mrg lesion is 70% stenosed.   Prox LAD to Mid LAD lesion is 50% stenosed.   Dist LAD lesion is 60% stenosed.   Prox RCA lesion is 100% stenosed.  Findings:  Ao = 125/65 (91) LV = 140/10 RA = 9 RV = 24/16 PA = 40/31 (30) PCW = 14 Fick cardiac output/index = 7.4/3.5 Thermo CO/CI = 7.3/3.5 PVR = 2.2 WU Ao sat = 96% PA sat = 65% PAPi = 1.0  Assessment: 1. 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 Mild PAH with severe RV failure though cardiac output is preserved  Plan/Discussion:  Medical therapy  Toribio Fuel, MD 5:06 PM  Findings Coronary Findings Diagnostic  Dominance: Right  Left Anterior Descending Prox LAD to Mid LAD lesion is 50% stenosed.  Dist LAD lesion is 60% stenosed.  Left Circumflex  First Obtuse Marginal Branch 1st Mrg lesion is 70% stenosed.  Right Coronary Artery Prox RCA lesion is 100% stenosed.  Right Posterior Atrioventricular Artery Collaterals RPAV filled by collaterals from Dist LAD.  Intervention  No interventions have been documented.   STRESS TESTS  MYOCARDIAL PERFUSION IMAGING 06/29/2022  Interpretation Summary   Findings are consistent with infarction with peri-infarct ischemia. The study is low risk.   No ST deviation was noted.   Left ventricular function is abnormal. Global function is mildly reduced. End diastolic cavity size is normal.   Prior study available for comparison from 11/22/2018.  Low risk stress nuclear study with mostly fixed defect of old basal inferior-inferolateral infarction with minimal peri-infarct ischemia and mildly reduced left ventricular global systolic function.   ECHOCARDIOGRAM  ECHOCARDIOGRAM LIMITED  02/08/2024  Narrative ECHOCARDIOGRAM REPORT    Patient Name:   Griffen Frayne Date of Exam: 02/08/2024 Medical Rec #:  [id number]  Height:       73.0 in Accession #:    [id number] Weight:       182.3 lb Date of Birth:  1951-03-18  BSA:          2.069 m Patient Age:    73 years   BP:           150/56 mmHg Patient Gender: M          HR:           77 bpm. Exam Location:  Inpatient  Procedure: Limited Echo, Cardiac Doppler and Color Doppler (Both Spectral and Color Flow Doppler were utilized during procedure).  Indications:    Acute ischemic heart disease, unspecified I24.9  History:        Patient has prior history of Echocardiogram examinations, most recent 02/02/2024. Previous Myocardial Infarction; Risk Factors:Hypertension.  Sonographer:    Jayson Gaskins Referring Phys: ABDULLAHI HUSSEIN  IMPRESSIONS   1. Left ventricular ejection fraction, by estimation, is 60 to 65%. The left ventricle has normal function. The left ventricle has no regional wall motion abnormalities. There is mild concentric left ventricular hypertrophy. Left ventricular diastolic parameters were normal. 2. Right ventricular systolic function is normal. The right ventricular size is normal. 3. The mitral valve is grossly normal. Trivial mitral valve regurgitation. 4. The aortic valve has an indeterminant number of cusps. There is moderate calcification of the aortic valve. Aortic valve regurgitation is not visualized. Aortic valve sclerosis/calcification is present, without any evidence of aortic stenosis. Aortic valve area, by VTI measures 2.29 cm. Aortic valve mean gradient measures 9.0 mmHg.  FINDINGS Left Ventricle: Left ventricular ejection fraction, by estimation, is 60 to 65%. The left ventricle has normal function. The left ventricle has no regional wall motion abnormalities. The left ventricular internal cavity size was normal in size. There is mild concentric left ventricular hypertrophy. Left  ventricular diastolic parameters were normal.  Right Ventricle: The right ventricular size is normal. No increase in right ventricular wall thickness. Right ventricular systolic function is normal.  Left Atrium: Left atrial size was normal in size.  Right Atrium: Right atrial size was normal in size.  Pericardium: There is no evidence of pericardial effusion.  Mitral Valve: The mitral valve is grossly normal. Trivial mitral valve regurgitation. MV peak gradient, 8.5 mmHg. The mean mitral valve gradient is 3.0 mmHg.  Tricuspid Valve: The tricuspid valve is grossly normal. Tricuspid valve regurgitation is mild.  Aortic Valve: The aortic valve has an indeterminant number of  cusps. There is moderate calcification of the aortic valve. Aortic valve regurgitation is not visualized. Aortic valve sclerosis/calcification is present, without any evidence of aortic stenosis. Aortic valve mean gradient measures 9.0 mmHg. Aortic valve peak gradient measures 16.3 mmHg. Aortic valve area, by VTI measures 2.29 cm.  Pulmonic Valve: The pulmonic valve was not well visualized. Pulmonic valve regurgitation is not visualized.  Aorta: The aortic root is normal in size and structure.  IAS/Shunts: No atrial level shunt detected by color flow Doppler.   LEFT VENTRICLE PLAX 2D LVIDd:         4.90 cm   Diastology LVIDs:         2.40 cm   LV e' medial:    12.50 cm/s LV PW:         1.20 cm   LV E/e' medial:  11.4 LV IVS:        1.30 cm   LV e' lateral:   11.20 cm/s LVOT diam:     2.00 cm   LV E/e' lateral: 12.8 LV SV:         84 LV SV Index:   40 LVOT Area:     3.14 cm   RIGHT VENTRICLE RV S prime:     16.60 cm/s  PULMONARY VEINS Diastolic Velocity: 71.20 cm/s S/D Velocity:       0.80 Systolic Velocity:  54.30 cm/s  LEFT ATRIUM             Index LA Vol (A2C):   43.0 ml 20.79 ml/m LA Vol (A4C):   40.4 ml 19.53 ml/m LA Biplane Vol: 44.3 ml 21.41 ml/m AORTIC VALVE AV Area (Vmax):    2.30 cm AV  Area (Vmean):   2.50 cm AV Area (VTI):     2.29 cm AV Vmax:           202.00 cm/s AV Vmean:          137.000 cm/s AV VTI:            0.365 m AV Peak Grad:      16.3 mmHg AV Mean Grad:      9.0 mmHg LVOT Vmax:         148.00 cm/s LVOT Vmean:        109.000 cm/s LVOT VTI:          0.266 m LVOT/AV VTI ratio: 0.73  MITRAL VALVE MV Area (PHT): 2.81 cm     SHUNTS MV Area VTI:   2.45 cm     Systemic VTI:  0.27 m MV Peak grad:  8.5 mmHg     Systemic Diam: 2.00 cm MV Mean grad:  3.0 mmHg MV Vmax:       1.46 m/s MV Vmean:      74.2 cm/s MV Decel Time: 270 msec MV E velocity: 143.00 cm/s MV A velocity: 66.00 cm/s MV E/A ratio:  2.17  Toribio Fuel MD Electronically signed by Toribio Fuel MD Signature Date/Time: 02/08/2024/7:46:06 PM    Final   TEE  ECHO TEE 06/29/2023  Narrative TRANSESOPHOGEAL ECHO REPORT    Patient Name:   Skanda Fiumara Date of Exam: 06/29/2023 Medical Rec #:  [id number]  Height:       73.0 in Accession #:    [id number] Weight:       185.4 lb Date of Birth:  1950-05-29  BSA:          2.083 m Patient Age:    72 years   BP:  156/59 mmHg Patient Gender: M          HR:           88 bpm. Exam Location:  Inpatient  Procedure: Limited Echo, Color Doppler and Transesophageal Echo (Both Spectral and Color Flow Doppler were utilized during procedure).  Indications:    Cardioversion  History:        Patient has prior history of Echocardiogram examinations, most recent 06/27/2023.  Sonographer:    Tinnie Gosling RDCS Referring Phys: [id number] JORDAN LEE  PROCEDURE: The transesophogeal probe was passed without difficulty through the esophogus of the patient. Sedation performed by different physician. The patient developed no complications during the procedure.  IMPRESSIONS   1. Left ventricular ejection fraction, by estimation, is 55%. The left ventricle has normal function. The left ventricle has no regional wall motion abnormalities. There is  severe concentric left ventricular hypertrophy. 2. Right ventricular systolic function is mildly reduced. The right ventricular size is normal. 3. Left atrial size was mildly dilated. No left atrial/left atrial appendage thrombus was detected. 4. Right atrial size was mildly dilated. 5. No PFO or ASD by color doppler. 6. The mitral valve is normal in structure. Trivial mitral valve regurgitation. No evidence of mitral stenosis. 7. The aortic valve is tricuspid. Aortic valve regurgitation is not visualized. No aortic stenosis is present. 8. The inferior vena cava is normal in size with greater than 50% respiratory variability, suggesting right atrial pressure of 3 mmHg.  FINDINGS Left Ventricle: Left ventricular ejection fraction, by estimation, is 55%. The left ventricle has normal function. The left ventricle has no regional wall motion abnormalities. The left ventricular internal cavity size was normal in size. There is severe concentric left ventricular hypertrophy.  Right Ventricle: The right ventricular size is normal. No increase in right ventricular wall thickness. Right ventricular systolic function is mildly reduced.  Left Atrium: Left atrial size was mildly dilated. No left atrial/left atrial appendage thrombus was detected.  Right Atrium: Right atrial size was mildly dilated.  Pericardium: There is no evidence of pericardial effusion.  Mitral Valve: The mitral valve is normal in structure. Trivial mitral valve regurgitation. No evidence of mitral valve stenosis.  Tricuspid Valve: The tricuspid valve is normal in structure. Tricuspid valve regurgitation is trivial.  Aortic Valve: The aortic valve is tricuspid. Aortic valve regurgitation is not visualized. No aortic stenosis is present.  Pulmonic Valve: The pulmonic valve was normal in structure. Pulmonic valve regurgitation is not visualized.  Aorta: The aortic root is normal in size and structure.  Venous: The inferior vena  cava is normal in size with greater than 50% respiratory variability, suggesting right atrial pressure of 3 mmHg.  IAS/Shunts: No PFO or ASD by color doppler.   IVC IVC diam: 1.30 cm  Dalton McleanMD Electronically signed by Ezra Kanner Signature Date/Time: 06/29/2023/8:33:40 PM    Final  MONITORS  LONG TERM MONITOR (3-14 DAYS) 12/06/2023  Narrative Patch Wear Time:  13 days and 23 hours (2025-07-27T17:07:22-0400 to 2025-08-10T17:07:09-398)  Patient had a min HR of 45 bpm, max HR of 113 bpm, and avg HR of 64 bpm. Predominant underlying rhythm was Sinus Rhythm. 1 run of Supraventricular Tachycardia occurred lasting 4 beats with a max rate of 111 bpm (avg 104 bpm). Isolated SVEs were occasional (1.6%, 20212), SVE Couplets were rare (<1.0%, 2196), and no SVE Triplets were present. Isolated VEs were occasional (3.1%, 40096), VE Couplets were rare (<1.0%, 46), and no VE Triplets were present. Ventricular Bigeminy and Trigeminy were  present.  SR/SB/ST Short runs SVT Occasional PACs/PVCs     CARDIAC MRI  MR CARDIAC MORPHOLOGY W WO CONTRAST 07/07/2023  Narrative CLINICAL DATA:  Clinical question of cardiac amyloidosis.  EXAM: CARDIAC MRI  TECHNIQUE: The patient was scanned on a 1.5 Tesla GE magnet. A dedicated cardiac coil was used. Functional imaging was done using Fiesta sequences. 2,3, and 4 chamber views were done to assess for RWMA's. Modified Simpson's rule using a short axis stack was used to calculate an ejection fraction on a dedicated work [employer]. The patient received 10 cc of Gadavist . After 10 minutes inversion recovery sequences were used to assess for infiltration and scar tissue. Flow quantification was performed 2 times during this examination with flow quantification performed at the levels of the ascending aorta above the valve, pulmonary artery above the valve.  CONTRAST:  10 cc  of Gadavist   FINDINGS: 1. Mild left  ventricular dilation, with LVEDD 52 mm, but LVEDVi 91 mL/m2.  Severe septal hypertrophy 15 mm. No systolic anterior motion of the mitral valve. Papillary muscle hypertrophy.  Low normal left ventricular systolic function (LVEF =52%). Basal and mid inferolateral hypokinesis and thinning of myocardium (without notable scar).  Left ventricular parametric mapping notable for normal T2.  ECV elevated, severely (60%) in the mid- inferolateral wall at worst.  There is no late gadolinium enhancement in the left ventricular myocardium. Six standard deviation used.  2.  Dilated right ventricle, with RVEDVI 95 mL/m2.  Normal right ventricular thickness.  Normal right ventricular systolic function (RVEF =53%).  3. Mild left atrial dilation. Severe right atrial dilation, maximal right atrial indexed volume 79.85 ml/m2.  4. Normal size of the aortic root, ascending aorta. Mild main pulmonary artery dilation, 28 mm.  5. Valve assessment:  Aortic Valve: Tri-leaflet aortic valve. Qualitatively there is no significant regurgitation. Regurgitant fraction 2%. Gradient 2 mm Hg.  Pulmonic Valve: Qualitatively, there is no significant regurgitation. Regurgitant fraction 1%.  Tricuspid Valve: Unclear morphology. Moderate regurgitation. Regurgitant fraction 22%.  Mitral Valve: Mild regurgitation. Regurgitant fraction 12%.  6.  Normal pericardium.  No pericardial effusion.  7. Small bilateral pleural effusions. Bilateral patchy ground-glass opacification of the aerated lung fields, most prominent on the right side. Recommended dedicated study if concerned for non-cardiac pathology.  IMPRESSION: 1. Study is less consistent with cardiac amyloidosis or infiltrative disease. Study is more consistent with inferolateral territory RCA or LCX disease (with ECV elevation but without notable scar). Though septum is 15 mm, there are no other class features consistent with hypertrophic  cardiomyopathy.  2.  Low normal LVEF (52%) with mild LV dilation.  3.  Right ventricular dilation with normalization of function.  4. Severe right atrial dilation. Mild pulmonary artery dilation. Moderate tricuspid regurgitation.  Stanly Leavens MD   Electronically Signed By: Stanly Leavens M.D. 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   PAF Hx of DVT and PE Superficial LUE thrombus - SR, continue amidarone, when  cleared for 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      Stanly Leavens, MD FASE [facility] Cardiologist [facility]  [address] [place], [address] [place], [place] [address] [phone]  12:03 PM

## 2024-03-24 NOTE — Plan of Care (Signed)
   Problem: Health Behavior/Discharge Planning: Goal: Ability to manage health-related needs will improve Outcome: Progressing

## 2024-03-25 ENCOUNTER — Inpatient Hospital Stay (HOSPITAL_COMMUNITY)

## 2024-03-25 LAB — GLUCOSE, CAPILLARY
Glucose-Capillary: 126 mg/dL — ABNORMAL HIGH (ref 70–99)
Glucose-Capillary: 137 mg/dL — ABNORMAL HIGH (ref 70–99)

## 2024-03-25 LAB — COMPREHENSIVE METABOLIC PANEL WITH GFR
ALT: 95 U/L — ABNORMAL HIGH (ref 0–44)
AST: 54 U/L — ABNORMAL HIGH (ref 15–41)
Albumin: <1.5 g/dL — ABNORMAL LOW (ref 3.5–5.0)
Alkaline Phosphatase: 80 U/L (ref 38–126)
Anion gap: 7 (ref 5–15)
BUN: 73 mg/dL — ABNORMAL HIGH (ref 8–23)
CO2: 24 mmol/L (ref 22–32)
Calcium: 7.4 mg/dL — ABNORMAL LOW (ref 8.9–10.3)
Chloride: 105 mmol/L (ref 98–111)
Creatinine, Ser: 2.89 mg/dL — ABNORMAL HIGH (ref 0.61–1.24)
GFR, Estimated: 23 mL/min — ABNORMAL LOW (ref >60)
Glucose, Bld: 112 mg/dL — ABNORMAL HIGH (ref 70–99)
Potassium: 4.3 mmol/L (ref 3.5–5.1)
Sodium: 136 mmol/L (ref 135–145)
Total Bilirubin: 0.5 mg/dL (ref 0.0–1.2)
Total Protein: 4.1 g/dL — ABNORMAL LOW (ref 6.5–8.1)

## 2024-03-25 LAB — CBC WITH DIFFERENTIAL/PLATELET
Abs Immature Granulocytes: 0.35 K/uL — ABNORMAL HIGH (ref 0.00–0.07)
Basophils Absolute: 0 K/uL (ref 0.0–0.1)
Basophils Relative: 0 %
Eosinophils Absolute: 0.1 K/uL (ref 0.0–0.5)
Eosinophils Relative: 1 %
HCT: 30.2 % — ABNORMAL LOW (ref 39.0–52.0)
Hemoglobin: 9.5 g/dL — ABNORMAL LOW (ref 13.0–17.0)
Immature Granulocytes: 2 %
Lymphocytes Relative: 4 %
Lymphs Abs: 0.6 K/uL — ABNORMAL LOW (ref 0.7–4.0)
MCH: 27.1 pg (ref 26.0–34.0)
MCHC: 31.5 g/dL (ref 30.0–36.0)
MCV: 86.3 fL (ref 80.0–100.0)
Monocytes Absolute: 1 K/uL (ref 0.1–1.0)
Monocytes Relative: 7 %
Neutro Abs: 13.5 K/uL — ABNORMAL HIGH (ref 1.7–7.7)
Neutrophils Relative %: 86 %
Platelets: 224 K/uL (ref 150–400)
RBC: 3.5 MIL/uL — ABNORMAL LOW (ref 4.22–5.81)
RDW: 16.4 % — ABNORMAL HIGH (ref 11.5–15.5)
WBC: 15.6 K/uL — ABNORMAL HIGH (ref 4.0–10.5)
nRBC: 0 % (ref 0.0–0.2)

## 2024-03-25 LAB — PROCALCITONIN: Procalcitonin: 0.84 ng/mL

## 2024-03-25 LAB — MAGNESIUM: Magnesium: 2.5 mg/dL — ABNORMAL HIGH (ref 1.7–2.4)

## 2024-03-25 LAB — PHOSPHORUS: Phosphorus: 3.8 mg/dL (ref 2.5–4.6)

## 2024-03-25 LAB — BRAIN NATRIURETIC PEPTIDE: B Natriuretic Peptide: 831.9 pg/mL — ABNORMAL HIGH (ref 0.0–100.0)

## 2024-03-25 MED ORDER — SODIUM CHLORIDE (PF) 0.9 % IJ SOLN
INTRAMUSCULAR | Status: AC
  Administered 2024-03-25: 10 mL
  Filled 2024-03-25: qty 10

## 2024-03-25 MED ORDER — KATE FARMS STANDARD 1.4 EN LIQD
1000.0000 mL | ENTERAL | Status: DC
  Administered 2024-03-25 – 2024-03-27 (×2): 1000 mL
  Filled 2024-03-25 (×3): qty 1000

## 2024-03-25 MED ORDER — ALBUMIN HUMAN 25 % IV SOLN
25.0000 g | Freq: Four times a day (QID) | INTRAVENOUS | Status: AC
  Administered 2024-03-25 (×2): 25 g via INTRAVENOUS
  Filled 2024-03-25 (×2): qty 100

## 2024-03-25 MED ORDER — ISOSORBIDE DINITRATE 30 MG PO TABS
30.0000 mg | ORAL_TABLET | Freq: Three times a day (TID) | ORAL | Status: DC
  Administered 2024-03-25 – 2024-03-26 (×3): 30 mg
  Filled 2024-03-25 (×3): qty 1

## 2024-03-25 MED ORDER — TORSEMIDE 20 MG PO TABS
40.0000 mg | ORAL_TABLET | Freq: Every day | ORAL | Status: DC
  Administered 2024-03-25 – 2024-03-26 (×2): 40 mg via ORAL
  Filled 2024-03-25 (×2): qty 2

## 2024-03-25 NOTE — Progress Notes (Signed)
 Physical Therapy Treatment Patient Details Name: Quest Tavenner MRN: [id number] DOB: [birth year] Today's Date: 03/25/2024   History of Present Illness Jiovani Mccammon is a 73 y.o. male who presented to [facility] ED 01/21/24 with several days of vomiting followed by an episode of severe tearing epigastric pain. Work-up revealed esophageal tear. Pt s/p esophageal stent with EGD 10/7. Return to OR for esophageal stent adjustment on 10/15, remained intubated after procedure due to suspected aspiration. Extubated 10/16.  Pt had CT placed 10/16. Reintubated 10/20-10/24. G tube placed 11/3. He accidentally pulled out the R chest tube on 11/7.L chest tube removed 11/11. Found to have superficial venous thrombosis of RUE 11/19. N&V with epigastirc pain 11/28, found severe duodenitis on repeat CT. S/P EGD on 12/2.  PMHx: HFrEF, AFib, CAD, PE complicated by cardiogenic shock, CKD 4, MI, peripheral neuropathy, and TIA.    PT Comments  Pt resting in bed on arrival with noted bowel incontinence. NT present throughout session to assist with peri-care. Pt with increased anxiousness with mobility this session, requesting assist for all aspects of mobility. Pt requiring min A to complete bed mobility and for transfers sit<>stand with cues for hand placement on rise. Pt able to take a few steps along the EOB to [facility] with CGA for safety and perform standing marching with no LOB noted. Ambulation away from EOB deferred due to pt pain and increased anxiousness with all mobility this session. Pt returning to supine at end of session. Educated pt on importance of frequent mobilization to maximize functional mobility gains. Pt continues to benefit from skilled PT services to progress toward functional mobility goals.     If plan is discharge home, recommend the following: A little help with walking and/or transfers;A little help with bathing/dressing/bathroom;Assistance with cooking/housework;Direct supervision/assist for medications  management;Assist for transportation;Help with stairs or ramp for entrance   Can travel by private vehicle     Yes  Equipment Recommendations  Rolling walker (2 wheels)    Recommendations for Other Services       Precautions / Restrictions Precautions Precautions: Fall Recall of Precautions/Restrictions: Impaired Precaution/Restrictions Comments: G-tube Restrictions Weight Bearing Restrictions Per Provider Order: No     Mobility  Bed Mobility Overal bed mobility: Needs Assistance Bed Mobility: Supine to Sit, Sit to Supine     Supine to sit: HOB elevated, Min assist Sit to supine: Mod assist   General bed mobility comments: pt requesting assist, probably able to self mobilize more but very anxious, requiring assist for LEs and trunk    Transfers Overall transfer level: Needs assistance Equipment used: Rolling walker (2 wheels) Transfers: Sit to/from Stand Sit to Stand: Min assist           General transfer comment: cues for hand placement    Ambulation/Gait               General Gait Details: deferred due to increased anxiety and bowel incontinence   Stairs             Wheelchair Mobility     Tilt Bed    Modified Rankin (Stroke Patients Only)       Balance Overall balance assessment: Mild deficits observed, not formally tested Sitting-balance support: Feet supported, No upper extremity supported Sitting balance-Leahy Scale: Good Sitting balance - Comments: EOB without support   Standing balance support: Reliant on assistive device for balance, During functional activity, Bilateral upper extremity supported Standing balance-Leahy Scale: Poor Standing balance comment: moderate to light UE assist on RW  Communication Communication Communication: No apparent difficulties Factors Affecting Communication: Hearing impaired  Cognition Arousal: Alert Behavior During Therapy: Anxious   PT - Cognitive  impairments: No apparent impairments                         Following commands: Intact Following commands impaired: Follows multi-step commands inconsistently    Cueing Cueing Techniques: Verbal cues  Exercises Other Exercises Other Exercises: standing marchnig x20    General Comments General comments (skin integrity, edema, etc.): SpO2 down to 84% with standing activity on supplemental O2, cues for pursed lip breathing, resolving to >90% with return to supine      Pertinent Vitals/Pain Pain Assessment Pain Assessment: Faces Faces Pain Scale: Hurts even more Pain Location: abdomen Pain Descriptors / Indicators: Discomfort Pain Intervention(s): Monitored during session, Limited activity within patient's tolerance, Patient requesting pain meds-RN notified    Home Living                          Prior Function            PT Goals (current goals can now be found in the care plan section) Acute Rehab PT Goals Patient Stated Goal: To get stronger before I go home PT Goal Formulation: With patient Time For Goal Achievement: 03/19/24 Progress towards PT goals: Progressing toward goals    Frequency    Min 2X/week      PT Plan      Co-evaluation              AM-PAC PT 6 Clicks Mobility   Outcome Measure  Help needed turning from your back to your side while in a flat bed without using bedrails?: A Little Help needed moving from lying on your back to sitting on the side of a flat bed without using bedrails?: A Little Help needed moving to and from a bed to a chair (including a wheelchair)?: A Little Help needed standing up from a chair using your arms (e.g., wheelchair or bedside chair)?: A Little Help needed to walk in hospital room?: Total Help needed climbing 3-5 steps with a railing? : Total 6 Click Score: 14    End of Session   Activity Tolerance: Patient tolerated treatment well Patient left: in bed;with call bell/phone within  reach;with bed alarm set Nurse Communication: Mobility status;Patient requests pain meds PT Visit Diagnosis: Difficulty in walking, not elsewhere classified (R26.2);Unsteadiness on feet (R26.81);Other abnormalities of gait and mobility (R26.89);Muscle weakness (generalized) (M62.81);Other symptoms and signs involving the nervous system (R29.898);Pain;History of falling (Z91.81) Pain - part of body:  (Abdomen area,)     Time: 8451-8390 PT Time Calculation (min) (ACUTE ONLY): 21 min  Charges:    $Therapeutic Activity: 8-22 mins PT General Charges $$ ACUTE PT VISIT: 1 Visit                     Hondo Nanda R. PTA Acute Rehabilitation Services Office: [phone]   Therisa CHRISTELLA Boor 03/25/2024, 4:15 PM

## 2024-03-25 NOTE — Progress Notes (Signed)
 Advanced Heart Failure Rounding Note  Cardiologist: Dorn Lesches, MD  Chief Complaint: S/p esophageal stent. AHF reconsulted 12/5 for A/C HFpEF.  Subjective:   - 10/6: esoph stent placement - 10/15: S/p EGD with stent repositioning by Dr. Shyrl  - 10/23: Ltd echo EF 60-65%  - 10/24: Barium swallow showed persistent leak  - 11/3: PEG tube placed - 11/11: L CT removed - 11/19: SVT in cephalic vein - 11/26: Stent removed. Esophagram (-) for esophageal leak - 11/28: Duodenitis/Enteritis. GI consulted. + bloody emesis.  - 12/2: EDG w/ ischemic ulcers. Carafate  increased + BID PPI - 12/4: PCCM consulted for increased WOB and progressive hypoxia.  - 12/5: AHF re consulted for A/C HFpEF  Has had long, complicated hospitalization. Day: 64  Since 11/10 patient up 27lbs. Weight appears inaccurate. I/Os incomplete. CVP 7-8 today.   Objective:    Weight Range: 77.6 kg Body mass index is 22.57 kg/m.   Vital Signs:   Temp:  [97.6 F (36.4 C)-98.6 F (37 C)] 97.8 F (36.6 C) (12/08 0700) Pulse Rate:  [73-86] 74 (12/08 0700) Resp:  [19-20] 19 (12/08 0700) BP: (123-187)/(51-67) 141/60 (12/08 0700) SpO2:  [90 %-98 %] 97 % (12/08 0700) Weight:  [77.6 kg] 77.6 kg (12/08 0300) Last BM Date : 03/23/24  Weight change: Filed Weights   03/23/24 0400 03/24/24 0343 03/25/24 0300  Weight: 69.3 kg 69.3 kg 77.6 kg   Intake/Output:  Intake/Output Summary (Last 24 hours) at 03/25/2024 1006 Last data filed at 03/24/2024 1644 Gross per 24 hour  Intake 445.02 ml  Output 700 ml  Net -254.98 ml    Physical Exam   General: Pale, elderly appearing. No distress  Cardiac: JVP ~8cm. No murmurs  Extremities: Warm and dry.  No peripheral edema.  Neuro: A&O x3. Affect pleasant.   Telemetry   SR 70s (personally reviewed)  Labs    CBC Recent Labs    03/24/24 0508 03/25/24 0535  WBC 14.3* 15.6*  NEUTROABS  --  13.5*  HGB 9.1* 9.5*  HCT 29.3* 30.2*  MCV 86.7 86.3  PLT 195 224    Basic Metabolic Panel Recent Labs    87/92/74 0508 03/25/24 0535  NA 141 136  K 3.9 4.3  CL 103 105  CO2 25 24  GLUCOSE 114* 112*  BUN 61* 73*  CREATININE 2.52* 2.89*  CALCIUM  7.6* 7.4*  MG 2.4 2.5*  PHOS 3.2 3.8   Liver Function Tests Recent Labs    03/24/24 0508 03/25/24 0535  AST  --  54*  ALT  --  95*  ALKPHOS  --  80  BILITOT  --  0.5  PROT  --  4.1*  ALBUMIN  <1.5* <1.5*   BNP (last 3 results) Recent Labs    06/27/23 1116 03/22/24 0913 03/25/24 0535  BNP 646.4* 2,404.1* 831.9*   Hemoglobin A1C No results for input(s): HGBA1C in the last 72 hours.  Fasting Lipid Panel No results for input(s): CHOL, HDL, LDLCALC, TRIG, CHOLHDL, LDLDIRECT in the last 72 hours.  Medications:    Scheduled Medications:  amiodarone   200 mg Per Tube Daily   atorvastatin   80 mg Per Tube Daily   carvedilol   6.25 mg Oral BID WC   Chlorhexidine  Gluconate Cloth  6 each Topical Daily   ezetimibe   10 mg Per Tube Daily   feeding supplement  237 mL Oral BID BM   [name] butt cream   Topical BID   guaiFENesin   10 mL Per Tube Q4H  hydrALAZINE   100 mg Per Tube Q8H   isosorbide  dinitrate  20 mg Per Tube TID   lidocaine   1 patch Transdermal Q24H   mouth rinse  15 mL Mouth Rinse 4 times per day   pantoprazole  (PROTONIX ) IV  40 mg Intravenous Q12H   saccharomyces boulardii  250 mg Per Tube BID   sodium chloride  flush  10-40 mL Intracatheter Q12H   sucralfate   1 g Per Tube Q6H    Infusions:  albumin  human     ampicillin -sulbactam (UNASYN ) IV 3 g (03/25/24 0821)   promethazine  (PHENERGAN ) injection (IM or IVPB) Stopped (03/18/24 0911)    PRN Medications: acetaminophen  (TYLENOL ) oral liquid 160 mg/5 mL, alum & mag hydroxide-simeth, cyclobenzaprine , diclofenac  Sodium, [COMPLETED] diphenoxylate -atropine  **FOLLOWED BY** diphenoxylate -atropine , glucagon  (human recombinant), guaiFENesin , HYDROmorphone  (DILAUDID ) injection **OR** HYDROmorphone  (DILAUDID ) injection,  levalbuterol , lip balm, mouth rinse, oxyCODONE , promethazine  (PHENERGAN ) injection (IM or IVPB), simethicone , sodium chloride , sodium chloride  flush, traZODone   Patient Profile    Patient with longstanding history of chronic heart failure with preserved ejection fraction, RV failure with history of pulmonary embolism presents with esophageal perforation.  S/P Stent Esophageal Stent. Stent repositioned 10/15. AHF signed off 11/10. Re-consulted 03/22/24 with A/C HFpEF.   Assessment/Plan   1.  Esophageal Perforation - Boerhaave syndrome.  - CT C/A/P: with pneumomediastinum and large mass-like density in lower esophagus - 10/6: EGD with esophageal stent placed  - 10/15: Stent migrated w/ leak > back to OR for repositioning  - 10/24: barium swallow w persistent leak  - 11/3: PEG placed - on TF and meds by PEG. Tolerating full liquid diet - 11/26: Stent removed. Esophagram (-) for esophageal leak - 12/2: EDG w/ ischemic ulcers. Carafate  increased + BID PPI - GI now signed off.   2. Acute on chronic HFpEF: h/o cardiogenic shock with RV failure in the setting of cardiac arrest thought to be caused by acute PE vs ACS in 3/25. Echo at the time showed EF 55%, severe, LVH, G2DD, and severely reduced RV function with strain. There was concern for cardiac sarcoid amyloid, however CMR LGE consistent with ischemic disease. RV on echo 9/25 with complete recovery, EF 60-65%, nl RV function.    - RHC 3/25: Mild PAH with severe RV failure though CO is preserved.   - Repeat echo 10/25 EF 60-65% - Diuretics held over weekend for AKI Cr now 2.9 - CVP 7-8. Weight down ~10 lbs. CXR with unilateral congestion. Restart Torsemide  40 mg daily  - continue hydral 100 mg tid, unable to use imdur  (cannot be crushed).  - increase isosorbide  to 30 mg BID - continue coreg  6.25 mg BID  - Place UNNA boots  3. 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  Medical management.  - suspect CP related to esophageal issues and not CAD - No s/s angina   4.  AKI on CKD Stage 3b: Baseline sCr ~ 2.1. - Bumped creatinine to 2.9 from 2.5. Diuretics held over the weekend - diuretics plan as above - avoid hypotension.   5. PAF:   - s/p TEE/DCCV 3/25 - has converted to AF a couple times this admit - continue amio 200 mg daily - Off Eliquis  with recent coffee ground OP from PEG - in NSR on tele    6.  H/o DVT/PE:  Bilateral upper and lower DVTs, mixed acute and chronic.  - s/p infrarenal IVC placement 3/25 - V/Q 3/25 with multiple  perfusion defects - off a/c with recent coffee ground OP from PEG   7. Carotid stenosis: h/o CVA. TCAR in 6/24. Okay to remain off plavix  at this point. - carotid US  9/25: widely patent carotid stent on R and no stenosis on L   8. Acute Hypoxic Respiratory Failure/PNA/ Exudative Lt Pleural Effusion  - CT placed for pleural effusion. Resp Cx w/ rare yeast, BCx NGTD  - remains on [place]. On unasyn  - follow effusion post diuresis. High risk to tap w/ previous occurrences.   9. Severe protein calorie malnutrition - Nutrition following. PEG tube + TF  10. HTN - BP elevated. Med changes as above  11. Superficial LUE thrombus - Continue PICC - off eliquis   Latana Colin, NP 03/25/2024  Advanced Heart Failure Team Pager [phone] (M-F; 7a - 5p)  Please contact [facility] for night-coverage after hours (4p -7a ) and weekends on [email or web address]

## 2024-03-25 NOTE — Progress Notes (Signed)
 Orthopedic Tech Progress Note Patient Details:  Billy [name] [birth year] [id number]  Ortho Devices Type of Ortho Device: Radio broadcast assistant Ortho Device/Splint Location: bi-lateral Ortho Device/Splint Interventions: Ordered, Application, Adjustment  The tech held the legs for me during application. Post Interventions Patient Tolerated: Well Instructions Provided: Care of device, Adjustment of device  Chandra Dorn PARAS 03/25/2024, 8:22 PM

## 2024-03-25 NOTE — Progress Notes (Addendum)
 Nutrition Follow-up  DOCUMENTATION CODES:   Severe malnutrition in context of acute illness/injury  INTERVENTION:  Restart TF via PEG: Billy [name] Standard 1.4 to 50 ml/h (1200 ml per day) Initiate at 58ml/hr and increase by 10ml/hr q12h until goal rate achieved Provides 1680 kcal (73% minimum estimated needs), 67 gm protein (68% minimum estimated needs), 852 ml free water  daily  Monitor diet advancement and tolerance and need for continued nutrition support Continue Ensure Plus High Protein po BID, each supplement provides 350 kcal and 20 grams of protein  Continue Magic cup TID with meals, each supplement provides 290 kcal and 9 grams of protein Draw vitamin C (pending) and K (resulted) w/ CRP (resulted) related to petechia  Monitor bowels and the needs to re-initiate Lomotil  and/or Banatrol for stool bulking or slow gastric emptying   NUTRITION DIAGNOSIS:  Severe Malnutrition related to acute illness (may have chronic component due to time frame of reported wt loss) as evidenced by severe fat depletion, moderate muscle depletion.  GOAL:  Patient will meet greater than or equal to 90% of their needs  MONITOR:  Diet advancement, Labs, Weight trends, I & O's, Skin (TPN)  REASON FOR ASSESSMENT:  Consult Enteral/tube feeding initiation and management  ASSESSMENT:   73 yo male admitted post several days of N/V followed by sudden onset of severe epigastric pain radiating to chest and back. Pt found to have small esophageal perforation above GE junction. PMH includes CAD, chronic HFpEF, HTN, HLD, DVT, CVA, PAF, CKD 3b.   10/05 Admitted 10/06 Esophagogram/barium study: distal esophageal perforation, TPN started 10/07 S/p Esophageal stent, TPN at goal rate of 80 ml/hr 10/08  CLD 10/10 Diet advanced to Dysphagia 3, Calorie Count 10/13 No meal tickets for calorie count, pt reports eating bites only 10/15 Esophageal stent migration with leak on esophagram with return to OR for stent  repositioning, likely aspiration event requiring intubation 10/16 Extubated, Chest tube placed 10/17 Limited echo with EF 50-55%, RV mildly reduced 10/20 Intubated early AM, L pleural effusion, pigtail chest tube inserted 10/23 - extubated; limited echo: 60-65% 10/24 - esophagram: persistent leak 10/28 - transfer out of ICU 11/03 - PEG tube placement  11/04 - TF started 11/5 - TF at 20 ml/hr 11/6 - TPN decreased to meet 50% of pt's needs, TF at 50 ml/hr,  11/7 - TF at 60 ml/hr, multiple loose BM, decrease rate of tube feeds to 45 ml/hr, continue TPN to meet 50% of needs 11/10 - increase TF to 76ml/hr; TPN meet 50% of needs 11/11 - increase TF to goal rate (modified to Vital 1.5 as Billy [name] out of stock); d/c TPN tomorrow 11/12 - modify to Vital 1.5 @60ml /hr (goal rate) 11/15 - increased stool output; modified back to KF1.4 @65ml /hr (goal rate) 11/20 - modified to boluses 11/21 - developed chest pain in the evening; modified back to continuous 11/23 - CT chest: small b/l pleural effusions w/ bibasilar atelectasis, PNA not excluded, scattered clusters of nodular density predominantly in upper and right lower lobes 11/25 - Xray abd: PEG tube in stomach w/ no contrast extravasation noted 11/26 - esophageal stent removal; advanced to clear liquid diet 11/28 - enteritis; TF on hold; GI consulted 11/29 - GI singed off 11/30 - TF re-initiated at 8ml/hr and titrating up 12/01 - TF paused due to severe epigastric pain + N/V, coffee ground output from PEG site; GI re-consulted  12/02 - TPN initiated 12/03 - GI re-initiated TF at goal rate and full liquid diet; abdominal  distention; TF rate reduced to 43ml/hr; GI sign off 12/04 - TPN and TF stopped; calorie count started 12/07 - calorie count stopped - insufficient; CT A/P: persistent duodenitis and interval development of terminal ileitis 12/08 - TFs restarted  Repeat CT yesterday showing persistent duodenitis and interval development of  terminal ileitis. GI recommending no change and continuing PPI indefinitely. Creatinine also rising likely 2/2 aggressive diuresis. Albumin  being given today in an attempt to pull fluid from third space into vasculature.   48 hour calorie count ordered and now complete. Day two and overall intake can be reviewed below.   Day 2: 12/06 Breakfast: 100% milk (2%) x2 (240 kcals, 16g protein) Lunch: 100% milk (2%) x1 (120 kcals, 8g protein) Dinner: No documentation received (0 kcals, 0g protein) Supplements: 100% Ensure Plus High Protein x2 (700 kcals, 40g protein)  Day 1 Total Intake: 306 kcal (13% of minimum estimated needs)  13.5 protein (12% of minimum estimated needs)  Day 2 Total intake: 1060 kcal (46% of minimum estimated needs)  64 protein (58% of minimum estimated needs)  OVERALL AVERAGE INTAKE 683 kcal (30% of minimum estimated needs) 39g (35% of minimum estimated needs)   After review of calorie count, patient's intake is insufficient to meet his estimated needs. Discussed with attending, who is amicable to re-starting tube feedings. Will start low and titrate up slowly to meet 75% of estimated calorie and protein needs. Eventual goal will be to modify to nocturnal feeds to provide supplemental nutrition until he can sustain PO intake on his own.   Patient continues to report a feeling of fullness in his stomach which is likely attributable to his volume status. Did not report this previously on [employer] tube feed formula. Met with patient at bedside to alert to re-initiation of tube feedings. He verbalizes understanding. Cannot endorse whether he feels swelling is improved or not.   Discussed importance of continued PO intake to promote diet progression, intake of whole foods, and reduce the need for artificial nutrition support as much as able. Educated patient regarding re-initiation of tube feeds in a supplemental capacity to bridge the calorie and protein deficit between intake  versus estimated needs. He verbalized understanding.   Admit Weight: 72.8 kg Current Weight: 77.6 kg Lowest Weight: 66.8 kg on 11/06   Down 6kg from last assessment. Continues with significant anasarca. Compression socks in place. Now transitioned to torsemide  (40mg  per day). Skin integrity stable. Stage 1 to bottom. Bowels stale with last BM documented 12/06. Monitor bowels and the need to reinitiate bowel regimen to adequately manage symptoms. Previously requiting Banatrol daily x4 and Lomotil  BID scheduled and up to four times daily PRN. Probiotic continues.   Has not required electrolyte repletion recently. BUN/Crt with trending up, likely 2/2 diuresis. Diuretic continues. No antiemetic use recently.   Drains/Lines: R brachial: PICC, triple lumen LUQ: G-tube (24Fr) placed 11/03 UOP: 700 x24 hours - appears incomplete   Labs from 11/10 reviewd: Sodium 136 (wdl) Potassium 4.3 (wdl) PHOS 3.8 (wdl) BUN 73 (H) Creatinine 2.89 (H)  Corr Ca >9.9  WBC 11.8 (H) CBGs 140-146 x24 hours A1c 6.0 (01/2024)  Vitamin Labs (12/05): CRP 10.8 (H) Vitamin K 0.49 (wdl) - CRP >10 would falsely lower this result, so vitamin K likely well within range Vitamin C pending - CRP >10 would falsely significantly lower this result   Meds: Pantoprazole  Florastor Sulcarafate Torsemide  IV Albumin  IV ABX  Diet Order:   Diet Order  Diet full liquid Room service appropriate? Yes; Fluid consistency: Thin  Diet effective now            EDUCATION NEEDS:  Education needs have been addressed  Skin:  Skin Assessment: Reviewed RN Assessment Skin Integrity Issues:: Stage I Stage I: buttocks, bilateral feet  Last BM:  12/06 - type 6 x1  Height:  Ht Readings from Last 1 Encounters:  03/19/24 6' 1 (1.854 m)   Weight:  Wt Readings from Last 1 Encounters:  03/25/24 77.6 kg   Ideal Body Weight:  83.6 kg  BMI:  Body mass index is 22.57 kg/m.  Estimated Nutritional Needs:   Kcal:   2300-2500 kcals  Protein:  110-130 g  Fluid:  2L  Blair Deaner MS, RD, LDN Registered Dietitian Clinical Nutrition RD Inpatient Contact Info in Amion

## 2024-03-25 NOTE — Progress Notes (Addendum)
 PROGRESS NOTE    Billy [name]  FMW:[id number] DOB: [birth year] DOA: 01/21/2024 PCP: Seabron Lenis, MD  Chief Complaint  Patient presents with   Abdominal Pain   Emesis    Brief Narrative:   This 73 year old Male with history of cardiac arrest earlier this year, congestive heart failure with reduced EF, Billy [name]-fib, PE presenting with nausea,  vomiting found to have esophageal rupture/tear requiring stent placement 01/23/2024.  Swallow study revealed additional leak and had stent repositioning 01/31/2024. 10/15 stent repositioned. There was concerns of gastric content aspiration therefore mechanically ventilated in the ICU.  Bedside ultrasound revealed right-sided pleural effusion requiring chest tube placement with removal of 300 cc of exudative fluid. He was also successfully extubated on 10/16. Patient has had increased oxygen  requirement requiring reintubation on 10/20. 10/24 barium swallow showed persistent leak. Eventually, patient was extubated and transferred out of ICU on 10/28. 11/3 PEG tube placed. 11/19 swelling of right arm showing on ultrasound SVT cephalic vein.    Hospital course complicated by acute hypoxic respiratory, PNA, acute on chronic HFpEF for which he is on antibiotics and IV diuresis per advanced heart failure team. PEG tube placed on 11/3.  Dietitian consulted for initiation of tube feed. Taken off TPN as was tolerating tube feed at goal.    11/25 PEG tube placement x-ray shows no extravasation. 11/27 torsemide  resumed for upper extremity edema. 11/28 increased epigastric pain and N/V.  Severe duodenitis on repeat CT.  GI re-consulted.  Made NPO+ice, tube feeds stopped and G-tube placed to gravity drain. 11/29 N/V improved. Still epigastric pain but improved.  Re-trial clear liquids with G tube off drain with TF running.12/1 developed severe epigastric pain with n/v, significant output from G tube initially just tube feeds then coffee-ground color. Also developed melena. Underwent  EGD with severe worsened gastroenteritis and multiple ischemic ulcers.   12/3-Started on full liquid diet today with plans to advance to mech soft diet as tolerated in next 3-5 days per GI. Resumed on TF today, if he tolerated TF and PO, can discontinue TPN.  Assessment & Plan:   Principal Problem:   Esophageal rupture Active Problems:   Pleural effusion   Ventilator dependence (HCC)   Acute respiratory failure with hypoxia (HCC)   Protein-calorie malnutrition, severe   Intractable pain   Pressure injury of skin   Acute on chronic systolic CHF (congestive heart failure) (HCC)  Goals of Care He's not currently interested in [facility] [place] discussion with palliative care.  His goals are to recover and ultimately to go back home.    Boerhaave syndrome : Esophageal rupture: - Last esophagram 02/09/24 showed persistent leak. -11/26 esophageal stent removed with no evidence of extravasation on esophagram in OR - Status post esophageal stent removal on 11/26 - repeat esophagogram 12/1 with no leak - CT surgery signed off 12/2  - Continue PPI IV BID, sucralfate  - Continue IV dilaudid  0.5mg  q2h PRN - plan to remove PEG tube outpatient. - will need CT surgery follow up outpatient. - CT 12/7 with persistent duodenitis and interval development of terminal ileitis - discussed with GI, recommended continue indefinite PPI - no other new recs   Severe gastroenteritis: Ischemic ulcer disease: - repeat CT Wil [place]/P 12/1 showed persistent severe circumferential thickening of duodenum, and proximal jejunum, small area of free fluid in abdomen and pelvis, small bilateral pleural effusions, postsurgical esophagus findings. - He continues to have severe epigastric pain with nausea and intermittent vomiting. - EGD 12/2 with severe gastritis, multiple non-bleeding superficial duodenal and  jejural ischemic ulcers. - GI recommending indefinite PPI, sucralfate  1 gram QID x 2 months -Started on full liquid diet  with plans  to advance to mech soft diet as tolerated in next 3-5 days per GI.  - GI signed off 12/3 - dietician following   Normochromic Anemia: Coffee Ground output from PEG: Melena - Related to severe gastroenteritis and peptic ulcer disease. - Hgb 9.5 < 10.5, baseline 8-12 - hold eliquis  for now.  Coffee ground output first documented around 12/1.  Will follow up with RN so we can decide on timing of resumption of eliquis .  - cont PPI IV BID and sucralfate  as elsewhere - transfuse for Hgb goal > 7 - monitor CBC   AKI on CKD stage 3B Creatinine rising in setting of recent diuresis Received contrast 12/1 Follow UA CT 12/7 without hydro Hold losartan  Diuresis per cards, on hold today.  Will hold off on renal consult with rising creatinine for the time being (suspect related to recent aggressive), UOP is adequate.  Will give albumin  today and follow.   Acute hypoxic respiratory failure: Aspiration Pneumonia Pleural effusion Acute CHF exacerbation: Respiratory failure multifactorial in the setting of pneumonia, pleural effusion, CHF exacerbation.  - CXR with patchy airspace disease with bilateral effusions - Improved with Lasix  and antibiotics. - pulmonary now signed off, recommending aggressive diuresis (they've signed off 12/5) - appreciate cardiology assistance with diuresis (on hold with AKI today) - Continue Unasyn  for 5 days. - strict I/O, daily weights  Severe Malnutrition S/p PEG On full liquid diet  Appreciate RD assistance - calorie count underway - tube feeds currently on hold, will follow with RD  Paroxysmal Tarvaris Puglia-fib : - DCCV 07/06/2023 - Amiodarone  200mg  daily.  - eliquis  is currently on hold as below - appreciate cards assistance   History of PE with DVTs: - with IVC filter placed 3/25 - hold Eliquis  5 for now with coffee ground output noted from peg - will discuss with RN when last time this was noted - I think we can restart eliquis  soon - monitor CBC     Hypertension: Hyperlipidemia: - maybe related to intractable pain with nausea and vomiting. - Continue IV labetalol  and IV hydralazine  PRN. - Hydralazine , isordil , coreg  - losartan  on hold with AKI - atorvastatin  80  - Zetia  10    Metabolic acidosis - resolved.   Bipolar disorder : - overall stable. - Appears not to be on medications.   Insomnia: --Continue trazodone     Bilateral UE swelling Superficial venous thrombosis : -Doppler US  ruled out DVT, Acute superficial thrombosis of cephalic veins noted - symptomatic management  RUE and b/l thigh ecchymosis and petechiae Initially Noted around PICC site, originally placed 02/05/24. Seems to be improving  Wounds - per discussion with RN today has stage 1 to his bottom, no open wounds --Continue Gerhardt twice daily  --Schedule Lomotil  1 tablet BID and QID PRN --Diarrhea overall has improved. -- continue wound care  Upper back pain: Musculoskeletal in nature imaging done as above 11/22 nonspecific-overall improved --flexeril  prn  --Mobilize   Lung nodules : - multiple nodular densities in R lung - needs outpatient follow up   History of Cardiac arrest 06/2023.     DVT prophylaxis: SCD Code Status: full Family Communication: none - called son [name], no answer 12/7 Disposition:   Status is: Inpatient Remains inpatient appropriate because: need for ongoing inpatient care   Consultants:  Cardiology PCCM GI CT surgery  Procedures:  12/2 EGD  11/26  Procedure: - Esophagogastroscopy - Removal of esophageal stent - esophagram with Omnipaque    11/03 Procedure: - Esophagogastroscopy - 38F Percutaneous gastroscopy tube placement  10/20 Chest tube intubation  10/16  Chest tube  10/15 Procedure: - Esophagogastroscopy - adjustment of previous stent  10/6 Procedure: - Esophagogastroscopy - 125 mm mm covered esophageal stent placement   Antimicrobials:  Anti-infectives (From admission, onward)     Start     Dose/Rate Route Frequency Ordered Stop   03/23/24 2000  Ampicillin -Sulbactam (UNASYN ) 3 g in sodium chloride  0.9 % 100 mL IVPB        3 g 200 mL/hr over 30 Minutes Intravenous Every 12 hours 03/23/24 1400 03/26/24 0759   03/21/24 1600  Ampicillin -Sulbactam (UNASYN ) 3 g in sodium chloride  0.9 % 100 mL IVPB  Status:  Discontinued        3 g 200 mL/hr over 30 Minutes Intravenous Every 8 hours 03/21/24 1539 03/23/24 1400   02/22/24 1844  fluconazole  (DIFLUCAN ) tablet 200 mg        200 mg Per Tube Daily 02/22/24 1844 03/03/24 1024   02/22/24 1630  fluconazole  (DIFLUCAN ) tablet 200 mg  Status:  Discontinued        200 mg Oral Daily 02/22/24 1537 02/22/24 1844   02/13/24 0900  piperacillin -tazobactam (ZOSYN ) IVPB 3.375 g  Status:  Discontinued        3.375 g 12.5 mL/hr over 240 Minutes Intravenous Every 8 hours 02/13/24 0812 03/20/24 1600   02/07/24 0800  vancomycin  (VANCOCIN ) IVPB 1000 mg/200 mL premix  Status:  Discontinued        1,000 mg 200 mL/hr over 60 Minutes Intravenous Every 24 hours 02/07/24 0632 02/09/24 0905   02/06/24 2200  meropenem  (MERREM ) 1 g in sodium chloride  0.9 % 100 mL IVPB  Status:  Discontinued        1 g 200 mL/hr over 30 Minutes Intravenous Every 12 hours 02/06/24 1127 02/13/24 0812   02/06/24 1300  vancomycin  (VANCOREADY) IVPB 1750 mg/350 mL  Status:  Discontinued        1,750 mg 175 mL/hr over 120 Minutes Intravenous Every 48 hours 02/04/24 1225 02/04/24 1226   02/06/24 0800  vancomycin  (VANCOCIN ) IVPB 1000 mg/200 mL premix        1,000 mg 200 mL/hr over 60 Minutes Intravenous  Once 02/06/24 0629 02/07/24 0829   02/04/24 1400  meropenem  (MERREM ) 1 g in sodium chloride  0.9 % 100 mL IVPB  Status:  Discontinued        1 g 200 mL/hr over 30 Minutes Intravenous Every 8 hours 02/04/24 1208 02/06/24 1127   02/04/24 1300  linezolid  (ZYVOX ) IVPB 600 mg  Status:  Discontinued        600 mg 300 mL/hr over 60 Minutes Intravenous Every 12 hours 02/04/24 1202  02/04/24 1208   02/04/24 1300  vancomycin  (VANCOREADY) IVPB 2000 mg/400 mL        2,000 mg 200 mL/hr over 120 Minutes Intravenous  Once 02/04/24 1208 02/04/24 1623   02/04/24 1208  vancomycin  variable dose per unstable renal function (pharmacist dosing)  Status:  Discontinued         Does not apply See admin instructions 02/04/24 1208 02/07/24 0632   01/31/24 1030  fluconazole  (DIFLUCAN ) IVPB 200 mg  Status:  Discontinued        200 mg 100 mL/hr over 60 Minutes Intravenous Daily 01/31/24 0938 02/20/24 1218   01/30/24 1445  piperacillin -tazobactam (ZOSYN ) IVPB 3.375 g  Status:  Discontinued  3.375 g 12.5 mL/hr over 240 Minutes Intravenous Every 8 hours 01/30/24 1354 02/04/24 1203   01/26/24 1000  amoxicillin -clavulanate (AUGMENTIN ) 400-57 MG/5ML suspension 875 mg  Status:  Discontinued        875 mg Oral Every 12 hours 01/26/24 0658 01/30/24 1354   01/26/24 1000  fluconazole  (DIFLUCAN ) 40 MG/ML suspension 200 mg  Status:  Discontinued        200 mg Oral Daily 01/26/24 0658 01/31/24 0938   01/22/24 1445  fluconazole  (DIFLUCAN ) IVPB 400 mg  Status:  Discontinued        400 mg 100 mL/hr over 120 Minutes Intravenous Every 24 hours 01/22/24 1436 01/22/24 1439   01/22/24 1445  fluconazole  (DIFLUCAN ) IVPB 200 mg  Status:  Discontinued        200 mg 100 mL/hr over 60 Minutes Intravenous Every 24 hours 01/22/24 1439 01/26/24 0658   01/21/24 2200  piperacillin -tazobactam (ZOSYN ) IVPB 3.375 g  Status:  Discontinued       Placed in Followed by Linked Group   3.375 g 12.5 mL/hr over 240 Minutes Intravenous Every 8 hours 01/21/24 1607 01/26/24 0658   01/21/24 1615  piperacillin -tazobactam (ZOSYN ) IVPB 3.375 g       Placed in Followed by Linked Group   3.375 g 100 mL/hr over 30 Minutes Intravenous  Once 01/21/24 1607 01/21/24 1651       Subjective: No new complaints today Abdominal pain comes and goes, Christa Fasig little better today  Objective: Vitals:   03/24/24 2323 03/25/24 0300 03/25/24  0539 03/25/24 0700  BP: (!) 123/51  (!) 167/67 (!) 141/60  Pulse: 75  82 74  Resp: 20   19  Temp: 97.6 F (36.4 C) 97.9 F (36.6 C)  97.8 F (36.6 C)  TempSrc: Oral Oral  Oral  SpO2: 98%   97%  Weight:  77.6 kg    Height:        Intake/Output Summary (Last 24 hours) at 03/25/2024 0931 Last data filed at 03/24/2024 1644 Gross per 24 hour  Intake 445.02 ml  Output 700 ml  Net -254.98 ml   Filed Weights   03/23/24 0400 03/24/24 0343 03/25/24 0300  Weight: 69.3 kg 69.3 kg 77.6 kg    Examination:  General: ill appearing, sweat on brow - appears slightly more comfortable than yesterday Cardiovascular: RRR Lungs: mildly increased WOB Abdomen: improved abdominal discomfort today, but still present - distended Neurological: Alert and oriented 3. Moves all extremities 4 with equal strength. Cranial nerves II through XII grossly intact. Extremities:anasarca     Data Reviewed: I have personally reviewed following labs and imaging studies  CBC: Recent Labs  Lab 03/21/24 2057 03/22/24 0600 03/23/24 0445 03/24/24 0508 03/25/24 0535  WBC 19.0* 16.6* 14.5* 14.3* 15.6*  NEUTROABS  --   --   --   --  13.5*  HGB 9.3* 8.7* 8.4* 9.1* 9.5*  HCT 29.5* 28.5* 27.0* 29.3* 30.2*  MCV 87.0 88.8 87.7 86.7 86.3  PLT 194 184 181 195 224    Basic Metabolic Panel: Recent Labs  Lab 03/19/24 0613 03/20/24 0500 03/21/24 0345 03/22/24 0600 03/22/24 0914 03/24/24 0508 03/25/24 0535  NA 138 141 142  --  138 141 136  K 3.5 3.5 4.2  --  4.0 3.9 4.3  CL 108 104 107  --  107 103 105  CO2 23 27 27   --  24 25 24   GLUCOSE 150* 140* 146*  --  178* 114* 112*  BUN 27* 24* 39*  --  47* 61* 73*  CREATININE 1.58* 1.60* 1.93*  --  2.16* 2.52* 2.89*  CALCIUM  7.9* 7.9* 7.9*  --  7.5* 7.6* 7.4*  MG 2.0 2.1 2.3  --   --  2.4 2.5*  PHOS 3.0 2.1* 2.2* 2.4*  --  3.2 3.8    GFR: Estimated Creatinine Clearance: 25.7 mL/min (Tekeya Geffert) (by C-G formula based on SCr of 2.89 mg/dL (H)).  Liver Function  Tests: Recent Labs  Lab 03/20/24 0500 03/21/24 0345 03/24/24 0508 03/25/24 0535  AST 41 31  --  54*  ALT 82* 68*  --  95*  ALKPHOS 54 63  --  80  BILITOT 0.5 0.5  --  0.5  PROT 4.0* 4.0*  --  4.1*  ALBUMIN  <1.5* <1.5* <1.5* <1.5*    CBG: Recent Labs  Lab 03/23/24 2355 03/24/24 0558 03/24/24 1132 03/24/24 2326 03/25/24 0607  GLUCAP 120* 121* 153* 126* 126*     No results found for this or any previous visit (from the past 240 hours).       Radiology Studies: DG CHEST PORT 1 VIEW Result Date: 03/25/2024 CLINICAL DATA:  Shortness of breath. EXAM: PORTABLE CHEST 1 VIEW COMPARISON:  03/22/2024 FINDINGS: The cardio pericardial silhouette is enlarged. Asymmetric patchy airspace disease is similar to prior. Right PICC line tip overlies the mid SVC level. Stable bilateral effusions. IMPRESSION: No substantial interval change. Asymmetric patchy airspace disease with bilateral effusions. Electronically Signed   By: Camellia Candle M.D.   On: 03/25/2024 05:41   CT ABDOMEN PELVIS WO CONTRAST Addendum Date: 03/24/2024 ** ADDENDUM #1 ** ADDENDUM: typo- within lower chest findings that should state interval worsening of bibasilar patchy peribronchovascular ground-glass airspace opacities  instead of air bronchograms. Impression should mention this finding as well as bilateral pleural effusions. ---------------------------------------------------- Electronically signed by: Kate Plummer MD 03/24/2024 08:29 PM EST RP Workstation: [id number]   Result Date: 03/24/2024 **ORIGINAL REPORT ** EXAM: CT ABDOMEN AND PELVIS WITHOUT CONTRAST 03/24/2024 06:13:01 PM TECHNIQUE: CT of the abdomen and pelvis was performed without the administration of intravenous contrast. Multiplanar reformatted images are provided for review. Automated exposure control, iterative reconstruction, and/or weight-based adjustment of the mA/kV was utilized to reduce the radiation dose to as low as reasonably achievable.  COMPARISON: CT abdomen and pelvis 03/18/2024 CLINICAL HISTORY: Abdominal pain, acute, nonlocalized. FINDINGS: LOWER CHEST: Persistent bilateral trace of small volume pleural effusions. Interval worsening of bibasilar patchy air bronchograms with ground-glass airspace opacities and interlobular septal wall thickening underlying. Left ventricular hypertrophy. Cardiac findings suggestive of anemia. Coronary artery calcifications. Trace hiatal hernia. LIVER: The liver is unremarkable. GALLBLADDER AND BILE DUCTS: Gallbladder is unremarkable. No biliary ductal dilatation. SPLEEN: No acute abnormality. PANCREAS: No acute abnormality. ADRENAL GLANDS: No acute abnormality. KIDNEYS, URETERS AND BLADDER: No stones in the kidneys or ureters. No hydronephrosis. No perinephric or periureteral stranding. Urinary bladder is unremarkable. GI AND BOWEL: Stomach demonstrates no acute abnormality. Gastrostomy tube in appropriate position. Persistent circumferential bowel thickening of the second, third, fourth portion of the duodenum. Associated surrounding fat stranding. Terminal ileum bowel wall thickening circumferentially. Associated fat stranding along the terminal ileum. No small bowel dilatation. No pneumatosis. PO contrast reaches the colon. No large bowel thickening or dilatation. The appendix is unremarkable. PERITONEUM AND RETROPERITONEUM: Interval increase in small volume simple free fluid consistent with ascites. No free air. VASCULATURE: Aorta is normal in caliber. Severe atherosclerotic plaque of the abdominal aorta. IVC filter in appropriate position. LYMPH NODES: No lymphadenopathy. REPRODUCTIVE ORGANS: No acute abnormality. BONES AND  SOFT TISSUES: Degenerative changes of the spine. Posterior disc osteophyte complex formation at the L4-L5 level. No acute osseous abnormality. No focal soft tissue abnormality. IMPRESSION: 1. Persistent duodenitis and interval development of terminal ileitis. No findings of bowel  obstruction or perforation. 2. Interval increase in small-volume simple ascites. 3. Limited evaluation on this noncontrast study. Electronically signed by: Morgane Naveau MD 03/24/2024 08:25 PM EST RP Workstation: [id number]        Scheduled Meds:  amiodarone   200 mg Per Tube Daily   atorvastatin   80 mg Per Tube Daily   carvedilol   6.25 mg Oral BID WC   Chlorhexidine  Gluconate Cloth  6 each Topical Daily   ezetimibe   10 mg Per Tube Daily   feeding supplement  237 mL Oral BID BM   [name] butt cream   Topical BID   guaiFENesin   10 mL Per Tube Q4H   hydrALAZINE   100 mg Per Tube Q8H   isosorbide  dinitrate  20 mg Per Tube TID   lidocaine   1 patch Transdermal Q24H   mouth rinse  15 mL Mouth Rinse 4 times per day   pantoprazole  (PROTONIX ) IV  40 mg Intravenous Q12H   saccharomyces boulardii  250 mg Per Tube BID   sodium chloride  flush  10-40 mL Intracatheter Q12H   sucralfate   1 g Per Tube Q6H   Continuous Infusions:  ampicillin -sulbactam (UNASYN ) IV 3 g (03/25/24 0821)   promethazine  (PHENERGAN ) injection (IM or IVPB) Stopped (03/18/24 0911)     LOS: 64 days    Time spent: over 30 min     Meliton Monte, MD [facility]   To contact the attending provider between 7A-7P or the covering provider during after hours 7P-7A, please log into the web site [email or web address] and access using universal [place] password for that web site. If you do not have the password, please call the hospital operator.  03/25/2024, 9:31 AM

## 2024-03-25 NOTE — Progress Notes (Signed)
 Orthopedic Tech Progress Note Patient Details:  Billy [name] [birth year] [id number] Secured chatted nurse for UB application this morning and we have not received a message back yet. Waiting for call or direct message. Patient ID: Billy [name], male   DOB: 1950/07/22, 73 y.o.   MRN: [id number]  Billy [name] [facility] 03/25/2024, 5:06 PM

## 2024-03-25 NOTE — TOC Progression Note (Signed)
 Transition of Care [facility]) - Progression Note    Patient Details  Name: Loretta Kluender MRN: [id number] Date of Birth: 01/23/1951  Transition of Care [facility]) CM/SW Contact  Arlana JINNY Nicholaus [place] Phone Number: [phone] 03/25/2024, 11:30 AM  Clinical Narrative:    Per the following attending that patient is not medically ready for discharge to [facility]. EDD 12/19. HF CSW will submit SNF insurance authorization when patient is medically ready for discharge.   HF CSW/CM will continue to follow and monitor for dc readiness.     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, SNF Pending bed offer               Expected Discharge Plan and Services In-house Referral: Clinical Social Work Discharge Planning Services: [employer] Consult Post Acute Care Choice: Skilled Nursing Facility Living arrangements for the past 2 months: Single Family Home                                       Social Drivers of Health (SDOH) Interventions SDOH Screenings   Food Insecurity: No Food Insecurity (01/22/2024)  Housing: High Risk (01/22/2024)  Transportation Needs: No Transportation Needs (01/22/2024)  Utilities: Not At Risk (01/22/2024)  Depression (PHQ2-9): Low Risk  (12/01/2020)  Social Connections: Socially Isolated (01/22/2024)  Tobacco Use: Low Risk  (03/19/2024)    Readmission Risk Interventions    06/27/2023    1:52 PM 10/14/2022   11:29 AM  Readmission Risk Prevention Plan  Post Dischage Appt  Complete  Medication Screening  Complete  Transportation Screening Complete Complete  HRI or Home Care Consult Complete   Social Work Consult for Recovery Care Planning/Counseling Complete   Palliative Care Screening Not Applicable   Medication Review Oceanographer) Referral to Pharmacy

## 2024-03-25 NOTE — Progress Notes (Signed)
   03/25/24 1955  BiPAP/CPAP/SIPAP  Reason BIPAP/CPAP not in use Non-compliant (Pt states that he is doing okay with the nasal cannula and will have the nurse call if he changes his mind. RT will assist as needed.)

## 2024-03-26 ENCOUNTER — Inpatient Hospital Stay (HOSPITAL_COMMUNITY)

## 2024-03-26 ENCOUNTER — Other Ambulatory Visit: Payer: Self-pay | Admitting: Cardiovascular Disease

## 2024-03-26 LAB — GLUCOSE, CAPILLARY
Glucose-Capillary: 136 mg/dL — ABNORMAL HIGH (ref 70–99)
Glucose-Capillary: 140 mg/dL — ABNORMAL HIGH (ref 70–99)
Glucose-Capillary: 148 mg/dL — ABNORMAL HIGH (ref 70–99)

## 2024-03-26 LAB — PROCALCITONIN: Procalcitonin: 1.2 ng/mL

## 2024-03-26 LAB — CBC WITH DIFFERENTIAL/PLATELET
Abs Immature Granulocytes: 0.35 K/uL — ABNORMAL HIGH (ref 0.00–0.07)
Basophils Absolute: 0 K/uL (ref 0.0–0.1)
Basophils Relative: 0 %
Eosinophils Absolute: 0.1 K/uL (ref 0.0–0.5)
Eosinophils Relative: 1 %
HCT: 27.7 % — ABNORMAL LOW (ref 39.0–52.0)
Hemoglobin: 8.6 g/dL — ABNORMAL LOW (ref 13.0–17.0)
Immature Granulocytes: 2 %
Lymphocytes Relative: 3 %
Lymphs Abs: 0.6 K/uL — ABNORMAL LOW (ref 0.7–4.0)
MCH: 26.9 pg (ref 26.0–34.0)
MCHC: 31 g/dL (ref 30.0–36.0)
MCV: 86.6 fL (ref 80.0–100.0)
Monocytes Absolute: 1 K/uL (ref 0.1–1.0)
Monocytes Relative: 6 %
Neutro Abs: 15.2 K/uL — ABNORMAL HIGH (ref 1.7–7.7)
Neutrophils Relative %: 88 %
Platelets: 226 K/uL (ref 150–400)
RBC: 3.2 MIL/uL — ABNORMAL LOW (ref 4.22–5.81)
RDW: 16.2 % — ABNORMAL HIGH (ref 11.5–15.5)
WBC: 17.2 K/uL — ABNORMAL HIGH (ref 4.0–10.5)
nRBC: 0 % (ref 0.0–0.2)

## 2024-03-26 LAB — COMPREHENSIVE METABOLIC PANEL WITH GFR
ALT: 70 U/L — ABNORMAL HIGH (ref 0–44)
AST: 50 U/L — ABNORMAL HIGH (ref 15–41)
Albumin: 1.5 g/dL — ABNORMAL LOW (ref 3.5–5.0)
Alkaline Phosphatase: 73 U/L (ref 38–126)
Anion gap: 19 — ABNORMAL HIGH (ref 5–15)
BUN: 91 mg/dL — ABNORMAL HIGH (ref 8–23)
CO2: 16 mmol/L — ABNORMAL LOW (ref 22–32)
Calcium: 7.4 mg/dL — ABNORMAL LOW (ref 8.9–10.3)
Chloride: 100 mmol/L (ref 98–111)
Creatinine, Ser: 3.71 mg/dL — ABNORMAL HIGH (ref 0.61–1.24)
GFR, Estimated: 17 mL/min — ABNORMAL LOW (ref >60)
Glucose, Bld: 142 mg/dL — ABNORMAL HIGH (ref 70–99)
Potassium: 4.8 mmol/L (ref 3.5–5.1)
Sodium: 135 mmol/L (ref 135–145)
Total Bilirubin: 0.6 mg/dL (ref 0.0–1.2)
Total Protein: 4.2 g/dL — ABNORMAL LOW (ref 6.5–8.1)

## 2024-03-26 MED ORDER — ISOSORBIDE DINITRATE 20 MG PO TABS
40.0000 mg | ORAL_TABLET | Freq: Three times a day (TID) | ORAL | Status: DC
  Administered 2024-03-26 (×2): 40 mg
  Filled 2024-03-26 (×3): qty 2

## 2024-03-26 MED ORDER — ALBUMIN HUMAN 25 % IV SOLN
25.0000 g | Freq: Four times a day (QID) | INTRAVENOUS | Status: AC
  Administered 2024-03-26 (×2): 25 g via INTRAVENOUS
  Filled 2024-03-26 (×2): qty 100

## 2024-03-26 MED ORDER — APIXABAN 2.5 MG PO TABS
2.5000 mg | ORAL_TABLET | Freq: Two times a day (BID) | ORAL | Status: DC
  Administered 2024-03-26: 2.5 mg via ORAL
  Filled 2024-03-26: qty 1

## 2024-03-26 MED ORDER — CARVEDILOL 12.5 MG PO TABS
12.5000 mg | ORAL_TABLET | Freq: Two times a day (BID) | ORAL | Status: DC
  Administered 2024-03-26 – 2024-03-27 (×2): 12.5 mg via ORAL
  Filled 2024-03-26 (×2): qty 1

## 2024-03-26 NOTE — Progress Notes (Signed)
 PROGRESS NOTE    Billy [name]  FMW:[id number] DOB: 15-Mar-1951 DOA: 01/21/2024 PCP: Seabron Lenis, MD  Chief Complaint  Patient presents with   Abdominal Pain   Emesis    Brief Narrative:   This 73 year old Male with history of cardiac arrest earlier this year, congestive heart failure with reduced EF, Hazelee Harbold-fib, PE presenting with nausea,  vomiting found to have esophageal rupture/tear requiring stent placement 01/23/2024.  Swallow study revealed additional leak and had stent repositioning 01/31/2024. 10/15 stent repositioned. There was concerns of gastric content aspiration therefore mechanically ventilated in the ICU.  Bedside ultrasound revealed right-sided pleural effusion requiring chest tube placement with removal of 300 cc of exudative fluid. He was also successfully extubated on 10/16. Patient has had increased oxygen  requirement requiring reintubation on 10/20. 10/24 barium swallow showed persistent leak. Eventually, patient was extubated and transferred out of ICU on 10/28. 11/3 PEG tube placed. 11/19 swelling of right arm showing on ultrasound SVT cephalic vein.    Hospital course complicated by acute hypoxic respiratory, PNA, acute on chronic HFpEF for which he is on antibiotics and IV diuresis per advanced heart failure team. PEG tube placed on 11/3.  Dietitian consulted for initiation of tube feed. Taken off TPN as was tolerating tube feed at goal.    11/25 PEG tube placement x-ray shows no extravasation. 11/27 torsemide  resumed for upper extremity edema. 11/28 increased epigastric pain and N/V.  Severe duodenitis on repeat CT.  GI re-consulted.  Made NPO+ice, tube feeds stopped and G-tube placed to gravity drain. 11/29 N/V improved. Still epigastric pain but improved.  Re-trial clear liquids with G tube off drain with TF running.12/1 developed severe epigastric pain with n/v, significant output from G tube initially just tube feeds then coffee-ground color. Also developed melena. Underwent  EGD with severe worsened gastroenteritis and multiple ischemic ulcers.   12/3-Started on full liquid diet today with plans to advance to mech soft diet as tolerated in next 3-5 days per GI. Resumed on TF today, if he tolerated TF and PO, can discontinue TPN.  Assessment & Plan:   Principal Problem:   Esophageal rupture Active Problems:   Pleural effusion   Ventilator dependence (HCC)   Acute respiratory failure with hypoxia (HCC)   Protein-calorie malnutrition, severe   Intractable pain   Pressure injury of skin   Acute on chronic systolic CHF (congestive heart failure) (HCC)  Goals of Care He's not currently interested in [facility]y discussion with palliative care.  His goals are to recover and ultimately to go back home.     AKI on CKD stage 3B Oliguria Creatinine rising in setting of recent diuresis Received contrast 12/1. Receiving losartan  until 12/7. Follow UA (pending collection, discussed with RN) CT 12/7 without hydro Hold arb Received torsemide  per cardiology 12/8.  Will discontinue today and consult renal given rising creatinine/oliguria (550 cc uop charted yesterday - will discuss accuracy with RN).  Will give albumin  again today.    Boerhaave syndrome : Esophageal rupture: - Last esophagram 02/09/24 showed persistent leak. -11/26 esophageal stent removed with no evidence of extravasation on esophagram in OR - Status post esophageal stent removal on 11/26 - repeat esophagogram 12/1 with no leak - CT surgery signed off 12/2  - Continue PPI IV BID, sucralfate  - Continue IV dilaudid  0.5mg  q2h PRN - plan to remove PEG tube outpatient. - will need CT surgery follow up outpatient. - CT 12/7 with persistent duodenitis and interval development of terminal ileitis - discussed with GI, recommended continue  indefinite PPI - no other new recs   Severe gastroenteritis: Ischemic ulcer disease: - repeat CT Omare Bilotta/P 12/1 showed persistent severe circumferential thickening of duodenum, and  proximal jejunum, small area of free fluid in abdomen and pelvis, small bilateral pleural effusions, postsurgical esophagus findings. - He continues to have severe epigastric pain with nausea and intermittent vomiting. - EGD 12/2 with severe gastritis, multiple non-bleeding superficial duodenal and jejural ischemic ulcers. - GI recommending indefinite PPI, sucralfate  1 gram QID x 2 months -Started on full liquid diet  with plans to advance to mech soft diet as tolerated in next 3-5 days per GI.  - GI signed off 12/3 - dietician following   Normochromic Anemia: Coffee Ground output from PEG: Melena - Related to severe gastroenteritis and peptic ulcer disease. - Hgb 9.5 < 10.5, baseline 8-12 - hold eliquis  for now.  Coffee ground output first documented around 12/1.  No further per discussion with RN - cont PPI IV BID and sucralfate  as elsewhere - transfuse for Hgb goal > 7 - monitor CBC   Acute hypoxic respiratory failure: Aspiration Pneumonia Pleural effusion Acute CHF exacerbation: Respiratory failure multifactorial in the setting of pneumonia, pleural effusion, CHF exacerbation.  - CXR with patchy airspace disease with bilateral effusions - Improved with Lasix  and antibiotics. - pulmonary now signed off, recommending aggressive diuresis (they've signed off 12/5) - appreciate cardiology assistance with diuresis (on hold with AKI today) - s/p Unasyn  - strict I/O, daily weights   Severe Malnutrition S/p PEG On full liquid diet  Appreciate RD assistance - calorie count underway - tube feeds currently on hold, will follow with RD   Paroxysmal Keo Schirmer-fib : - DCCV 07/06/2023 - Amiodarone  200mg  daily.  - eliquis  is currently on hold as below - appreciate cards assistance   History of PE with DVTs: - with IVC filter placed 3/25 - I think we can restart his eliquis  soon - monitor CBC    Hypertension: Hyperlipidemia: - maybe related to intractable pain with nausea and vomiting. -  Continue IV labetalol  and IV hydralazine  PRN. - Hydralazine , isordil , coreg  - losartan  on hold with AKI - atorvastatin  80  - Zetia  10    Metabolic acidosis - related to AKI above   Bipolar disorder : - overall stable. - Appears not to be on medications.   Insomnia: --Continue trazodone     Bilateral UE swelling Superficial venous thrombosis : -Doppler US  ruled out DVT, Acute superficial thrombosis of cephalic veins noted - symptomatic management   RUE and b/l thigh ecchymosis and petechiae Initially Noted around PICC site, originally placed 02/05/24. Seems to be improving   Wounds - per discussion with RN today has stage 1 to his bottom, no open wounds --Continue Gerhardt twice daily  --Schedule Lomotil  1 tablet BID and QID PRN --Diarrhea overall has improved. -- continue wound care   Upper back pain: Musculoskeletal in nature imaging done as above 11/22 nonspecific-overall improved --flexeril  prn  --Mobilize    Lung nodules : - multiple nodular densities in R lung - needs outpatient follow up    History of Cardiac arrest 06/2023.     DVT prophylaxis: SCD Code Status: full Family Communication: none Disposition:   Status is: Inpatient Remains inpatient appropriate because: need for continued inpatient care   Consultants:  GI Cards PCCM CT surgery  Procedures:  12/2 EGD   11/26 Procedure: - Esophagogastroscopy - Removal of esophageal stent - esophagram with Omnipaque     11/03 Procedure: - Esophagogastroscopy - 32F Percutaneous gastroscopy  tube placement   10/20 Chest tube intubation   10/16  Chest tube   10/15 Procedure: - Esophagogastroscopy - adjustment of previous stent   10/6 Procedure: - Esophagogastroscopy - 125 mm mm covered esophageal stent placement  Antimicrobials:  Anti-infectives (From admission, onward)    Start     Dose/Rate Route Frequency Ordered Stop   03/23/24 2000  Ampicillin -Sulbactam (UNASYN ) 3 g in sodium  chloride 0.9 % 100 mL IVPB        3 g 200 mL/hr over 30 Minutes Intravenous Every 12 hours 03/23/24 1400 03/25/24 2200   03/21/24 1600  Ampicillin -Sulbactam (UNASYN ) 3 g in sodium chloride  0.9 % 100 mL IVPB  Status:  Discontinued        3 g 200 mL/hr over 30 Minutes Intravenous Every 8 hours 03/21/24 1539 03/23/24 1400   02/22/24 1844  fluconazole  (DIFLUCAN ) tablet 200 mg        200 mg Per Tube Daily 02/22/24 1844 03/03/24 1024   02/22/24 1630  fluconazole  (DIFLUCAN ) tablet 200 mg  Status:  Discontinued        200 mg Oral Daily 02/22/24 1537 02/22/24 1844   02/13/24 0900  piperacillin -tazobactam (ZOSYN ) IVPB 3.375 g  Status:  Discontinued        3.375 g 12.5 mL/hr over 240 Minutes Intravenous Every 8 hours 02/13/24 0812 03/20/24 1600   02/07/24 0800  vancomycin  (VANCOCIN ) IVPB 1000 mg/200 mL premix  Status:  Discontinued        1,000 mg 200 mL/hr over 60 Minutes Intravenous Every 24 hours 02/07/24 0632 02/09/24 0905   02/06/24 2200  meropenem  (MERREM ) 1 g in sodium chloride  0.9 % 100 mL IVPB  Status:  Discontinued        1 g 200 mL/hr over 30 Minutes Intravenous Every 12 hours 02/06/24 1127 02/13/24 0812   02/06/24 1300  vancomycin  (VANCOREADY) IVPB 1750 mg/350 mL  Status:  Discontinued        1,750 mg 175 mL/hr over 120 Minutes Intravenous Every 48 hours 02/04/24 1225 02/04/24 1226   02/06/24 0800  vancomycin  (VANCOCIN ) IVPB 1000 mg/200 mL premix        1,000 mg 200 mL/hr over 60 Minutes Intravenous  Once 02/06/24 0629 02/07/24 0829   02/04/24 1400  meropenem  (MERREM ) 1 g in sodium chloride  0.9 % 100 mL IVPB  Status:  Discontinued        1 g 200 mL/hr over 30 Minutes Intravenous Every 8 hours 02/04/24 1208 02/06/24 1127   02/04/24 1300  linezolid  (ZYVOX ) IVPB 600 mg  Status:  Discontinued        600 mg 300 mL/hr over 60 Minutes Intravenous Every 12 hours 02/04/24 1202 02/04/24 1208   02/04/24 1300  vancomycin  (VANCOREADY) IVPB 2000 mg/400 mL        2,000 mg 200 mL/hr over 120  Minutes Intravenous  Once 02/04/24 1208 02/04/24 1623   02/04/24 1208  vancomycin  variable dose per unstable renal function (pharmacist dosing)  Status:  Discontinued         Does not apply See admin instructions 02/04/24 1208 02/07/24 0632   01/31/24 1030  fluconazole  (DIFLUCAN ) IVPB 200 mg  Status:  Discontinued        200 mg 100 mL/hr over 60 Minutes Intravenous Daily 01/31/24 0938 02/20/24 1218   01/30/24 1445  piperacillin -tazobactam (ZOSYN ) IVPB 3.375 g  Status:  Discontinued        3.375 g 12.5 mL/hr over 240 Minutes Intravenous Every 8 hours 01/30/24 1354  02/04/24 1203   01/26/24 1000  amoxicillin -clavulanate (AUGMENTIN ) 400-57 MG/5ML suspension 875 mg  Status:  Discontinued        875 mg Oral Every 12 hours 01/26/24 0658 01/30/24 1354   01/26/24 1000  fluconazole  (DIFLUCAN ) 40 MG/ML suspension 200 mg  Status:  Discontinued        200 mg Oral Daily 01/26/24 0658 01/31/24 0938   01/22/24 1445  fluconazole  (DIFLUCAN ) IVPB 400 mg  Status:  Discontinued        400 mg 100 mL/hr over 120 Minutes Intravenous Every 24 hours 01/22/24 1436 01/22/24 1439   01/22/24 1445  fluconazole  (DIFLUCAN ) IVPB 200 mg  Status:  Discontinued        200 mg 100 mL/hr over 60 Minutes Intravenous Every 24 hours 01/22/24 1439 01/26/24 0658   01/21/24 2200  piperacillin -tazobactam (ZOSYN ) IVPB 3.375 g  Status:  Discontinued       Placed in Followed by Linked Group   3.375 g 12.5 mL/hr over 240 Minutes Intravenous Every 8 hours 01/21/24 1607 01/26/24 0658   01/21/24 1615  piperacillin -tazobactam (ZOSYN ) IVPB 3.375 g       Placed in Followed by Linked Group   3.375 g 100 mL/hr over 30 Minutes Intravenous  Once 01/21/24 1607 01/21/24 1651       Subjective: No new complaints today Abdomen is ok today  Objective: Vitals:   03/25/24 1940 03/25/24 2300 03/26/24 0300 03/26/24 0736  BP: (!) 146/58 (!) 136/53 (!) 157/60 (!) 153/54  Pulse: 73 77 75 74  Resp: 20 18 18 20   Temp: 98.3 F (36.8 C) 97.7 F  (36.5 C) 97.6 F (36.4 C) 97.6 F (36.4 C)  TempSrc: Oral Oral Oral Oral  SpO2: 96% 96% 97% 97%  Weight:   79.6 kg   Height:        Intake/Output Summary (Last 24 hours) at 03/26/2024 0933 Last data filed at 03/25/2024 1900 Gross per 24 hour  Intake 240 ml  Output 550 ml  Net -310 ml   Filed Weights   03/24/24 0343 03/25/24 0300 03/26/24 0300  Weight: 69.3 kg 77.6 kg 79.6 kg    Examination:  General exam: ill appearing, diaphoretic Respiratory system: unlabored Cardiovascular system: RRR Gastrointestinal system: less tender today, mild distension  Central nervous system: Alert and oriented. No focal neurological deficits. Skin: scattered petechiae, worse on bilateral LE's, but scattered petechiae/ecchymoses to arms as well Extremities: diffuse anasarca    Data Reviewed: I have personally reviewed following labs and imaging studies  CBC: Recent Labs  Lab 03/22/24 0600 03/23/24 0445 03/24/24 0508 03/25/24 0535 03/26/24 0809  WBC 16.6* 14.5* 14.3* 15.6* 17.2*  NEUTROABS  --   --   --  13.5* 15.2*  HGB 8.7* 8.4* 9.1* 9.5* 8.6*  HCT 28.5* 27.0* 29.3* 30.2* 27.7*  MCV 88.8 87.7 86.7 86.3 86.6  PLT 184 181 195 224 226    Basic Metabolic Panel: Recent Labs  Lab 03/20/24 0500 03/21/24 0345 03/22/24 0600 03/22/24 0914 03/24/24 0508 03/25/24 0535 03/26/24 0809  NA 141 142  --  138 141 136 135  K 3.5 4.2  --  4.0 3.9 4.3 4.8  CL 104 107  --  107 103 105 100  CO2 27 27  --  24 25 24  16*  GLUCOSE 140* 146*  --  178* 114* 112* 142*  BUN 24* 39*  --  47* 61* 73* 91*  CREATININE 1.60* 1.93*  --  2.16* 2.52* 2.89* 3.71*  CALCIUM  7.9* 7.9*  --  7.5* 7.6* 7.4* 7.4*  MG 2.1 2.3  --   --  2.4 2.5*  --   PHOS 2.1* 2.2* 2.4*  --  3.2 3.8  --     GFR: Estimated Creatinine Clearance: 20.6 mL/min (Ezeriah Luty) (by C-G formula based on SCr of 3.71 mg/dL (H)).  Liver Function Tests: Recent Labs  Lab 03/20/24 0500 03/21/24 0345 03/24/24 0508 03/25/24 0535 03/26/24 0809  AST  41 31  --  54* 50*  ALT 82* 68*  --  95* 70*  ALKPHOS 54 63  --  80 73  BILITOT 0.5 0.5  --  0.5 0.6  PROT 4.0* 4.0*  --  4.1* 4.2*  ALBUMIN  <1.5* <1.5* <1.5* <1.5* 1.5*    CBG: Recent Labs  Lab 03/24/24 2326 03/25/24 0607 03/25/24 1116 03/26/24 0040 03/26/24 0616  GLUCAP 126* 126* 137* 148* 136*     No results found for this or any previous visit (from the past 240 hours).       Radiology Studies: DG CHEST PORT 1 VIEW Result Date: 03/25/2024 CLINICAL DATA:  Shortness of breath. EXAM: PORTABLE CHEST 1 VIEW COMPARISON:  03/22/2024 FINDINGS: The cardio pericardial silhouette is enlarged. Asymmetric patchy airspace disease is similar to prior. Right PICC line tip overlies the mid SVC level. Stable bilateral effusions. IMPRESSION: No substantial interval change. Asymmetric patchy airspace disease with bilateral effusions. Electronically Signed   By: Camellia Candle M.D.   On: 03/25/2024 05:41   CT ABDOMEN PELVIS WO CONTRAST Addendum Date: 03/24/2024 ** ADDENDUM #1 ** ADDENDUM: typo- within lower chest findings that should state interval worsening of bibasilar patchy peribronchovascular ground-glass airspace opacities  instead of air bronchograms. Impression should mention this finding as well as bilateral pleural effusions. ---------------------------------------------------- Electronically signed by: Kate Plummer MD 03/24/2024 08:29 PM EST RP Workstation: [id number]   Result Date: 03/24/2024 ** ORIGINAL REPORT ** EXAM: CT ABDOMEN AND PELVIS WITHOUT CONTRAST 03/24/2024 06:13:01 PM TECHNIQUE: CT of the abdomen and pelvis was performed without the administration of intravenous contrast. Multiplanar reformatted images are provided for review. Automated exposure control, iterative reconstruction, and/or weight-based adjustment of the mA/kV was utilized to reduce the radiation dose to as low as reasonably achievable. COMPARISON: CT abdomen and pelvis 03/18/2024 CLINICAL HISTORY: Abdominal  pain, acute, nonlocalized. FINDINGS: LOWER CHEST: Persistent bilateral trace of small volume pleural effusions. Interval worsening of bibasilar patchy air bronchograms with ground-glass airspace opacities and interlobular septal wall thickening underlying. Left ventricular hypertrophy. Cardiac findings suggestive of anemia. Coronary artery calcifications. Trace hiatal hernia. LIVER: The liver is unremarkable. GALLBLADDER AND BILE DUCTS: Gallbladder is unremarkable. No biliary ductal dilatation. SPLEEN: No acute abnormality. PANCREAS: No acute abnormality. ADRENAL GLANDS: No acute abnormality. KIDNEYS, URETERS AND BLADDER: No stones in the kidneys or ureters. No hydronephrosis. No perinephric or periureteral stranding. Urinary bladder is unremarkable. GI AND BOWEL: Stomach demonstrates no acute abnormality. Gastrostomy tube in appropriate position. Persistent circumferential bowel thickening of the second, third, fourth portion of the duodenum. Associated surrounding fat stranding. Terminal ileum bowel wall thickening circumferentially. Associated fat stranding along the terminal ileum. No small bowel dilatation. No pneumatosis. PO contrast reaches the colon. No large bowel thickening or dilatation. The appendix is unremarkable. PERITONEUM AND RETROPERITONEUM: Interval increase in small volume simple free fluid consistent with ascites. No free air. VASCULATURE: Aorta is normal in caliber. Severe atherosclerotic plaque of the abdominal aorta. IVC filter in appropriate position. LYMPH NODES: No lymphadenopathy. REPRODUCTIVE ORGANS: No acute abnormality. BONES AND SOFT TISSUES: Degenerative changes of the spine. Posterior  disc osteophyte complex formation at the L4-L5 level. No acute osseous abnormality. No focal soft tissue abnormality. IMPRESSION: 1. Persistent duodenitis and interval development of terminal ileitis. No findings of bowel obstruction or perforation. 2. Interval increase in small-volume simple ascites.  3. Limited evaluation on this noncontrast study. Electronically signed by: Morgane Naveau MD 03/24/2024 08:25 PM EST RP Workstation: [id number]        Scheduled Meds:  amiodarone   200 mg Per Tube Daily   atorvastatin   80 mg Per Tube Daily   carvedilol   6.25 mg Oral BID WC   Chlorhexidine  Gluconate Cloth  6 each Topical Daily   ezetimibe   10 mg Per Tube Daily   feeding supplement  237 mL Oral BID BM   [name] butt cream   Topical BID   guaiFENesin   10 mL Per Tube Q4H   hydrALAZINE   100 mg Per Tube Q8H   isosorbide  dinitrate  30 mg Per Tube TID   lidocaine   1 patch Transdermal Q24H   mouth rinse  15 mL Mouth Rinse 4 times per day   pantoprazole  (PROTONIX ) IV  40 mg Intravenous Q12H   saccharomyces boulardii  250 mg Per Tube BID   sodium chloride  flush  10-40 mL Intracatheter Q12H   sucralfate   1 g Per Tube Q6H   Continuous Infusions:  albumin  human     feeding supplement (KATE FARMS STANDARD 1.4) Liquid 30 mL/hr at 03/26/24 0500   promethazine  (PHENERGAN ) injection (IM or IVPB) Stopped (03/18/24 0911)     LOS: 65 days    Time spent: over 30 min     Meliton Monte, MD [facility]   To contact the attending provider between 7A-7P or the covering provider during after hours 7P-7A, please log into the web site [email or web address] and access using universal [place] password for that web site. If you do not have the password, please call the hospital operator.  03/26/2024, 9:33 AM

## 2024-03-26 NOTE — Telephone Encounter (Signed)
 Prescription refill request for Eliquis  received. Indication:afib Last office visit:9/25 Scr: 2.89  12/25 Age:73 Weight:79.6  kg  Prescription refilled

## 2024-03-26 NOTE — Progress Notes (Signed)
 Advanced Heart Failure Rounding Note  Cardiologist: Dorn Lesches, MD  Chief Complaint: S/p esophageal stent. AHF reconsulted 12/5 for A/C HFpEF.  Subjective:   - 10/6: esoph stent placement - 10/15: S/p EGD with stent repositioning by Dr. Shyrl  - 10/23: Ltd echo EF 60-65%  - 10/24: Barium swallow showed persistent leak  - 11/3: PEG tube placed - 11/11: L CT removed - 11/19: SVT in cephalic vein - 11/26: Stent removed. Esophagram (-) for esophageal leak - 11/28: Duodenitis/Enteritis. GI consulted. + bloody emesis.  - 12/2: EDG w/ ischemic ulcers. Carafate  increased + BID PPI - 12/4: PCCM consulted for increased WOB and progressive hypoxia.  - 12/5: AHF re consulted for A/C HFpEF  Has had long, complicated hospitalization. Day: 65  Worsening AKI. Scr 2.89>3.71 with PO torsemide . Weight up 4 lbs from yesterday. I/Os incomplete. Received torsemide  this morning prior to discontinuing. Albumin  given per primary. Anasarctic and lethargic on exam.    Objective:    Weight Range: 79.6 kg Body mass index is 23.15 kg/m.   Vital Signs:   Temp:  [97.6 F (36.4 C)-98.3 F (36.8 C)] 97.6 F (36.4 C) (12/09 0736) Pulse Rate:  [73-77] 74 (12/09 0736) Resp:  [18-20] 20 (12/09 0736) BP: (136-177)/(53-69) 153/54 (12/09 0736) SpO2:  [96 %-97 %] 97 % (12/09 0736) Weight:  [79.6 kg] 79.6 kg (12/09 0300) Last BM Date : 03/25/24  Weight change: Filed Weights   03/24/24 0343 03/25/24 0300 03/26/24 0300  Weight: 69.3 kg 77.6 kg 79.6 kg   Intake/Output:  Intake/Output Summary (Last 24 hours) at 03/26/2024 1125 Last data filed at 03/25/2024 1900 Gross per 24 hour  Intake --  Output 550 ml  Net -550 ml    Physical Exam   General: Ill appearing. No distress  Cardiac: CVP 7-9. No murmurs  Resp: Lung sounds clear and equal B/L  Extremities: Warm and dry. Anasarca in thighs, abdomen, and upper arms.  Neuro: Affect flat  Telemetry   SR 70s (personally reviewed)  Labs     CBC Recent Labs    03/25/24 0535 03/26/24 0809  WBC 15.6* 17.2*  NEUTROABS 13.5* 15.2*  HGB 9.5* 8.6*  HCT 30.2* 27.7*  MCV 86.3 86.6  PLT 224 226   Basic Metabolic Panel Recent Labs    87/92/74 0508 03/25/24 0535 03/26/24 0809  NA 141 136 135  K 3.9 4.3 4.8  CL 103 105 100  CO2 25 24 16*  GLUCOSE 114* 112* 142*  BUN 61* 73* 91*  CREATININE 2.52* 2.89* 3.71*  CALCIUM  7.6* 7.4* 7.4*  MG 2.4 2.5*  --   PHOS 3.2 3.8  --    Liver Function Tests Recent Labs    03/25/24 0535 03/26/24 0809  AST 54* 50*  ALT 95* 70*  ALKPHOS 80 73  BILITOT 0.5 0.6  PROT 4.1* 4.2*  ALBUMIN  <1.5* 1.5*   BNP (last 3 results) Recent Labs    06/27/23 1116 03/22/24 0913 03/25/24 0535  BNP 646.4* 2,404.1* 831.9*   Hemoglobin A1C No results for input(s): HGBA1C in the last 72 hours.  Fasting Lipid Panel No results for input(s): CHOL, HDL, LDLCALC, TRIG, CHOLHDL, LDLDIRECT in the last 72 hours.  Medications:    Scheduled Medications:  amiodarone   200 mg Per Tube Daily   atorvastatin   80 mg Per Tube Daily   carvedilol   6.25 mg Oral BID WC   Chlorhexidine  Gluconate Cloth  6 each Topical Daily   ezetimibe   10 mg Per Tube Daily  feeding supplement  237 mL Oral BID BM   [name] butt cream   Topical BID   guaiFENesin   10 mL Per Tube Q4H   hydrALAZINE   100 mg Per Tube Q8H   isosorbide  dinitrate  30 mg Per Tube TID   lidocaine   1 patch Transdermal Q24H   mouth rinse  15 mL Mouth Rinse 4 times per day   pantoprazole  (PROTONIX ) IV  40 mg Intravenous Q12H   saccharomyces boulardii  250 mg Per Tube BID   sodium chloride  flush  10-40 mL Intracatheter Q12H   sucralfate   1 g Per Tube Q6H    Infusions:  albumin  human 25 g (03/26/24 1033)   feeding supplement (KATE FARMS STANDARD 1.4) Liquid 30 mL/hr at 03/26/24 0500   promethazine  (PHENERGAN ) injection (IM or IVPB) Stopped (03/18/24 0911)    PRN Medications: acetaminophen  (TYLENOL ) oral liquid 160 mg/5 mL, alum &  mag hydroxide-simeth, cyclobenzaprine , diclofenac  Sodium, [COMPLETED] diphenoxylate -atropine  **FOLLOWED BY** diphenoxylate -atropine , glucagon  (human recombinant), guaiFENesin , HYDROmorphone  (DILAUDID ) injection **OR** HYDROmorphone  (DILAUDID ) injection, lip balm, mouth rinse, oxyCODONE , promethazine  (PHENERGAN ) injection (IM or IVPB), simethicone , sodium chloride , sodium chloride  flush, traZODone   Patient Profile    Patient with longstanding history of chronic heart failure with preserved ejection fraction, RV failure with history of pulmonary embolism presents with esophageal perforation.  S/P Stent Esophageal Stent. Stent repositioned 10/15. AHF signed off 11/10. Re-consulted 03/22/24 with A/C HFpEF.   Assessment/Plan   1.  Esophageal Perforation - Boerhaave syndrome.  - CT C/A/P: with pneumomediastinum and large mass-like density in lower esophagus - 10/6: EGD with esophageal stent placed  - 10/15: Stent migrated w/ leak > back to OR for repositioning  - 10/24: barium swallow w persistent leak  - 11/3: PEG placed - on TF and meds by PEG. Tolerating full liquid diet - 11/26: Stent removed. Esophagram (-) for esophageal leak - 12/2: EDG w/ ischemic ulcers. Carafate  increased + BID PPI - GI now signed off.   2. Acute on chronic HFpEF: h/o cardiogenic shock with RV failure in the setting of cardiac arrest thought to be caused by acute PE vs ACS in 3/25. Echo at the time showed EF 55%, severe, LVH, G2DD, and severely reduced RV function with strain. There was concern for cardiac sarcoid amyloid, however CMR LGE consistent with ischemic disease. RV on echo 9/25 with complete recovery, EF 60-65%, nl RV function.    - RHC 3/25: Mild PAH with severe RV failure though CO is preserved.   - Repeat echo 10/25 EF 60-65% - CVP 7-9. Bed weights and I/Os have been variable. Remains with severe anasarca. - Worsening AKI with diuresis. Will hold further diuresis. ?intravascularly dry - continue hydral 100  mg tid, unable to use imdur  (cannot be crushed).  - increase isosorbide  to 430 mg TID - increase coreg  12.5 mg BID  - Continue UNNA boots  3. 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 Medical management.  - suspect CP related to esophageal issues and not CAD - No s/s angina   4.  AKI on CKD Stage 3b: Baseline sCr ~ 2.1. - Worsening AKI 2.89>3.71 with PO Torsemide  - received torsemide  dose this am; hold further diuresis - albumin  given per primary team - avoid hypotension.   5. PAF:   - s/p TEE/DCCV 3/25 - has converted to AF a couple times this admit - continue amio 200 mg daily - Off Eliquis  with recent coffee ground  OP from PEG - in NSR on tele    6.  H/o DVT/PE:  Bilateral upper and lower DVTs, mixed acute and chronic.  - s/p infrarenal IVC placement 3/25 - V/Q 3/25 with multiple perfusion defects - off a/c with recent coffee ground OP from PEG   7. Carotid stenosis: h/o CVA. TCAR in 6/24. Okay to remain off plavix  at this point. - carotid US  9/25: widely patent carotid stent on R and no stenosis on L   8. Acute Hypoxic Respiratory Failure/PNA/ Exudative Lt Pleural Effusion  - CT placed for pleural effusion. Resp Cx w/ rare yeast, BCx NGTD  - remains on Duchesne. On unasyn  - follow effusion post diuresis. High risk to tap w/ previous occurrences.   9. Severe protein calorie malnutrition - Nutrition following. PEG tube + TF  10. HTN - BP elevated. Med changes as above  11. Superficial LUE thrombus - Continue PICC - off eliquis   Finley Chevez, NP 03/26/2024  Advanced Heart Failure Team Pager [phone] (M-F; 7a - 5p)  Please contact [facility] for night-coverage after hours (4p -7a ) and weekends on [email or web address]

## 2024-03-26 NOTE — Progress Notes (Signed)
 Occupational Therapy Treatment Patient Details Name: Billy [name] MRN: [id number] DOB: 1950/07/08 Today's Date: 03/26/2024   History of present illness Billy [name] is a 73 y.o. male who presented to [facility] ED 01/21/24 with several days of vomiting followed by an episode of severe tearing epigastric pain. Work-up revealed esophageal tear. Pt s/p esophageal stent with EGD 10/7. Return to OR for esophageal stent adjustment on 10/15, remained intubated after procedure due to suspected aspiration. Extubated 10/16.  Pt had CT placed 10/16. Reintubated 10/20-10/24. G tube placed 11/3. He accidentally pulled out the R chest tube on 11/7.L chest tube removed 11/11. Found to have superficial venous thrombosis of RUE 11/19. N&V with epigastirc pain 11/28, found severe duodenitis on repeat CT. S/P EGD on 12/2.  PMHx: HFrEF, AFib, CAD, PE complicated by cardiogenic shock, CKD 4, MI, peripheral neuropathy, and TIA.   OT comments  Pt progressing toward goals, with incr ability to pace activity today, needs min cues to keep RW close and slow gait speed. Pt needs max A for LB ADL, mod A for bed mobility and min A overall for transfers. Pt with VSS on 4-5L O2, cues for PLB with activity. Noted incr BUE edema, provided with squeeze ball for BUE strengthening and edema control. Pt presenting with impairments listed below, will follow acutely. Patient will benefit from continued inpatient follow up therapy, <3 hours/day to maximize safety/ind with ADL/functional mobility, worth considering LTAC at d/c if pt with continued medical needs.       If plan is discharge home, recommend the following:  Assistance with cooking/housework;Direct supervision/assist for medications management;Direct supervision/assist for financial management;Assist for transportation;Help with stairs or ramp for entrance;A lot of help with bathing/dressing/bathroom;A lot of help with walking and/or transfers   Equipment Recommendations  Other (comment)  (defer)    Recommendations for Other Services PT consult    Precautions / Restrictions Precautions Precautions: Fall Recall of Precautions/Restrictions: Impaired Precaution/Restrictions Comments: G-tube, watch O2 Restrictions Weight Bearing Restrictions Per Provider Order: No       Mobility Bed Mobility Overal bed mobility: Needs Assistance Bed Mobility: Supine to Sit, Sit to Supine     Supine to sit: Mod assist Sit to supine: Mod assist   General bed mobility comments: assist for trunk elevation and returning BLE into bed    Transfers Overall transfer level: Needs assistance Equipment used: Rolling walker (2 wheels) Transfers: Sit to/from Stand Sit to Stand: Min assist                 Balance Overall balance assessment: Mild deficits observed, not formally tested Sitting-balance support: Feet supported, No upper extremity supported Sitting balance-Leahy Scale: Good Sitting balance - Comments: EOB without support   Standing balance support: Reliant on assistive device for balance, During functional activity, Bilateral upper extremity supported Standing balance-Leahy Scale: Poor                             ADL either performed or assessed with clinical judgement   ADL                       Lower Body Dressing: Maximal assistance;Bed level   Toilet Transfer: Minimal assistance;Rolling walker (2 wheels)           Functional mobility during ADLs: Minimal assistance;Rolling walker (2 wheels)      Extremity/Trunk Assessment Upper Extremity Assessment Upper Extremity Assessment: Generalized weakness (becoming edematous bilaterally)   Lower  Extremity Assessment Lower Extremity Assessment: Defer to PT evaluation        Vision   Vision Assessment?: No apparent visual deficits   Perception Perception Perception: Not tested   Praxis Praxis Praxis: Not tested   Communication Communication Communication: No apparent  difficulties Factors Affecting Communication: Hearing impaired   Cognition Arousal: Alert Behavior During Therapy: Anxious                                 Following commands: Intact Following commands impaired: Follows multi-step commands inconsistently      Cueing   Cueing Techniques: Verbal cues  Exercises Other Exercises Other Exercises: BUE squeeze ball x5    Shoulder Instructions       General Comments SpO2 with inconsistent pleth on 4L with ambulation, satting 90s on 5L resting in bed at end of session after RN incr O2    Pertinent Vitals/ Pain       Pain Assessment Pain Assessment: No/denies pain  Home Living                                          Prior Functioning/Environment              Frequency  Min 2X/week        Progress Toward Goals  OT Goals(current goals can now be found in the care plan section)  Progress towards OT goals: Progressing toward goals  Acute Rehab OT Goals OT Goal Formulation: With patient Time For Goal Achievement: 04/01/24 Potential to Achieve Goals: Fair ADL Goals Pt Will Perform Grooming: with supervision;standing Pt Will Perform Lower Body Bathing: with supervision;sit to/from stand Pt Will Perform Upper Body Dressing: with contact guard assist;sitting Pt Will Perform Lower Body Dressing: with contact guard assist;sitting/lateral leans;sit to/from stand;with adaptive equipment Pt Will Transfer to Toilet: with contact guard assist;ambulating;regular height toilet Pt Will Perform Toileting - Clothing Manipulation and hygiene: sitting/lateral leans;sit to/from stand;with supervision Pt/caregiver will Perform Home Exercise Program: Increased ROM;Increased strength;Both right and left upper extremity;With Supervision Additional ADL Goal #1: pt will tolerate standing functional activity x10 min in prep for OOB ADLs Additional ADL Goal #2: Pt to increase standing tolerance > 8 min during  ADLs/mobility without need for seated rest break  Plan      Co-evaluation                 AM-PAC OT 6 Clicks Daily Activity     Outcome Measure   Help from another person eating meals?: A Little Help from another person taking care of personal grooming?: A Little Help from another person toileting, which includes using toliet, bedpan, or urinal?: A Lot Help from another person bathing (including washing, rinsing, drying)?: A Lot Help from another person to put on and taking off regular upper body clothing?: A Little Help from another person to put on and taking off regular lower body clothing?: A Lot 6 Click Score: 15    End of Session Equipment Utilized During Treatment: Gait belt;Rolling walker (2 wheels)  OT Visit Diagnosis: Muscle weakness (generalized) (M62.81);Other symptoms and signs involving cognitive function;Unsteadiness on feet (R26.81)   Activity Tolerance Patient tolerated treatment well   Patient Left in bed;with call bell/phone within reach;with bed alarm set;with nursing/sitter in room   Nurse Communication Mobility status  Time: 1538-1610 OT Time Calculation (min): 32 min  Charges: OT General Charges $OT Visit: 1 Visit OT Treatments $Self Care/Home Management : 8-22 mins $Therapeutic Activity: 8-22 mins  Yuvraj Pfeifer K, OTD, OTR/L SecureChat Preferred Acute Rehab [phone]   Laneta MARLA Pereyra 03/26/2024, 4:41 PM

## 2024-03-26 NOTE — Consult Note (Signed)
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  GEARLINE NORRIS 03/26/2024, 12:26 PM

## 2024-03-27 ENCOUNTER — Inpatient Hospital Stay (HOSPITAL_COMMUNITY)

## 2024-03-27 DIAGNOSIS — I5033 Acute on chronic diastolic (congestive) heart failure: Secondary | ICD-10-CM | POA: Diagnosis not present

## 2024-03-27 DIAGNOSIS — R0602 Shortness of breath: Secondary | ICD-10-CM | POA: Diagnosis not present

## 2024-03-27 DIAGNOSIS — E785 Hyperlipidemia, unspecified: Secondary | ICD-10-CM | POA: Diagnosis not present

## 2024-03-27 DIAGNOSIS — N179 Acute kidney failure, unspecified: Secondary | ICD-10-CM | POA: Diagnosis not present

## 2024-03-27 DIAGNOSIS — N1832 Chronic kidney disease, stage 3b: Secondary | ICD-10-CM | POA: Diagnosis not present

## 2024-03-27 DIAGNOSIS — I480 Paroxysmal atrial fibrillation: Secondary | ICD-10-CM | POA: Diagnosis not present

## 2024-03-27 DIAGNOSIS — F419 Anxiety disorder, unspecified: Secondary | ICD-10-CM

## 2024-03-27 DIAGNOSIS — E43 Unspecified severe protein-calorie malnutrition: Secondary | ICD-10-CM | POA: Diagnosis not present

## 2024-03-27 DIAGNOSIS — N183 Chronic kidney disease, stage 3 unspecified: Secondary | ICD-10-CM

## 2024-03-27 DIAGNOSIS — K223 Perforation of esophagus: Secondary | ICD-10-CM | POA: Diagnosis not present

## 2024-03-27 DIAGNOSIS — Z452 Encounter for adjustment and management of vascular access device: Secondary | ICD-10-CM | POA: Diagnosis not present

## 2024-03-27 DIAGNOSIS — J9601 Acute respiratory failure with hypoxia: Secondary | ICD-10-CM | POA: Diagnosis not present

## 2024-03-27 DIAGNOSIS — D631 Anemia in chronic kidney disease: Secondary | ICD-10-CM | POA: Diagnosis not present

## 2024-03-27 DIAGNOSIS — R918 Other nonspecific abnormal finding of lung field: Secondary | ICD-10-CM | POA: Diagnosis not present

## 2024-03-27 DIAGNOSIS — J810 Acute pulmonary edema: Secondary | ICD-10-CM | POA: Diagnosis not present

## 2024-03-27 DIAGNOSIS — J90 Pleural effusion, not elsewhere classified: Secondary | ICD-10-CM | POA: Diagnosis not present

## 2024-03-27 LAB — ENA+DNA/DS+ANTICH+CENTRO+JO...
Anti JO-1: <0.2 AI (ref 0.0–0.9)
Centromere Ab Screen: <0.2 AI (ref 0.0–0.9)
Chromatin Ab SerPl-aCnc: <0.2 AI (ref 0.0–0.9)
ENA SM Ab Ser-aCnc: <0.2 AI (ref 0.0–0.9)
Ribonucleic Protein: <0.2 AI (ref 0.0–0.9)
SSA (Ro) (ENA) Antibody, IgG: 0.3 AI (ref 0.0–0.9)
SSB (La) (ENA) Antibody, IgG: <0.2 AI (ref 0.0–0.9)
Scleroderma (Scl-70) (ENA) Antibody, IgG: <0.2 AI (ref 0.0–0.9)
ds DNA Ab: 17 [IU]/mL — ABNORMAL HIGH (ref 0–9)

## 2024-03-27 LAB — CBC WITH DIFFERENTIAL/PLATELET
Abs Immature Granulocytes: 0.39 K/uL — ABNORMAL HIGH (ref 0.00–0.07)
Basophils Absolute: 0 K/uL (ref 0.0–0.1)
Basophils Relative: 0 %
Eosinophils Absolute: 0.1 K/uL (ref 0.0–0.5)
Eosinophils Relative: 1 %
HCT: 25.5 % — ABNORMAL LOW (ref 39.0–52.0)
Hemoglobin: 8 g/dL — ABNORMAL LOW (ref 13.0–17.0)
Immature Granulocytes: 2 %
Lymphocytes Relative: 3 %
Lymphs Abs: 0.5 K/uL — ABNORMAL LOW (ref 0.7–4.0)
MCH: 27.6 pg (ref 26.0–34.0)
MCHC: 31.4 g/dL (ref 30.0–36.0)
MCV: 87.9 fL (ref 80.0–100.0)
Monocytes Absolute: 0.9 K/uL (ref 0.1–1.0)
Monocytes Relative: 5 %
Neutro Abs: 16.1 K/uL — ABNORMAL HIGH (ref 1.7–7.7)
Neutrophils Relative %: 89 %
Platelets: 229 K/uL (ref 150–400)
RBC: 2.9 MIL/uL — ABNORMAL LOW (ref 4.22–5.81)
RDW: 16.4 % — ABNORMAL HIGH (ref 11.5–15.5)
WBC: 18 K/uL — ABNORMAL HIGH (ref 4.0–10.5)
nRBC: 0 % (ref 0.0–0.2)

## 2024-03-27 LAB — URINALYSIS, ROUTINE W REFLEX MICROSCOPIC
Bilirubin Urine: NEGATIVE
Glucose, UA: 50 mg/dL — AB
Glucose, UA: NEGATIVE mg/dL
Ketones, ur: NEGATIVE mg/dL
Ketones, ur: NEGATIVE mg/dL
Nitrite: NEGATIVE
Nitrite: NEGATIVE
Protein, ur: >=300 mg/dL — AB
Protein, ur: >300 mg/dL — AB
RBC / HPF: >50 RBC/hpf (ref 0–5)
Specific Gravity, Urine: 1.02 (ref 1.005–1.030)
Specific Gravity, Urine: 1.025 (ref 1.005–1.030)
pH: 5 (ref 5.0–8.0)
pH: 5.5 (ref 5.0–8.0)

## 2024-03-27 LAB — COOXEMETRY PANEL
Carboxyhemoglobin: 1.8 % — ABNORMAL HIGH (ref 0.5–1.5)
Methemoglobin: <0.7 % (ref 0.0–1.5)
O2 Saturation: 72.7 %
Total hemoglobin: 7.4 g/dL — ABNORMAL LOW (ref 12.0–16.0)

## 2024-03-27 LAB — POCT I-STAT 7, (LYTES, BLD GAS, ICA,H+H)
Acid-base deficit: 1 mmol/L (ref 0.0–2.0)
Bicarbonate: 24.2 mmol/L (ref 20.0–28.0)
Calcium, Ion: 1.13 mmol/L — ABNORMAL LOW (ref 1.15–1.40)
HCT: 23 % — ABNORMAL LOW (ref 39.0–52.0)
Hemoglobin: 7.8 g/dL — ABNORMAL LOW (ref 13.0–17.0)
O2 Saturation: 100 %
Patient temperature: 96.8
Potassium: 6.1 mmol/L — ABNORMAL HIGH (ref 3.5–5.1)
Sodium: 134 mmol/L — ABNORMAL LOW (ref 135–145)
TCO2: 25 mmol/L (ref 22–32)
pCO2 arterial: 37.5 mmHg (ref 32–48)
pH, Arterial: 7.414 (ref 7.35–7.45)
pO2, Arterial: 228 mmHg — ABNORMAL HIGH (ref 83–108)

## 2024-03-27 LAB — COMPREHENSIVE METABOLIC PANEL WITH GFR
ALT: 69 U/L — ABNORMAL HIGH (ref 0–44)
AST: 59 U/L — ABNORMAL HIGH (ref 15–41)
Albumin: 2 g/dL — ABNORMAL LOW (ref 3.5–5.0)
Alkaline Phosphatase: 81 U/L (ref 38–126)
Anion gap: 7 (ref 5–15)
BUN: 101 mg/dL — ABNORMAL HIGH (ref 8–23)
CO2: 26 mmol/L (ref 22–32)
Calcium: 7.7 mg/dL — ABNORMAL LOW (ref 8.9–10.3)
Chloride: 103 mmol/L (ref 98–111)
Creatinine, Ser: 4.3 mg/dL — ABNORMAL HIGH (ref 0.61–1.24)
GFR, Estimated: 14 mL/min — ABNORMAL LOW (ref >60)
Glucose, Bld: 122 mg/dL — ABNORMAL HIGH (ref 70–99)
Potassium: 4.9 mmol/L (ref 3.5–5.1)
Sodium: 136 mmol/L (ref 135–145)
Total Bilirubin: 0.8 mg/dL (ref 0.0–1.2)
Total Protein: 4.7 g/dL — ABNORMAL LOW (ref 6.5–8.1)

## 2024-03-27 LAB — URINALYSIS, MICROSCOPIC (REFLEX): RBC / HPF: >50 RBC/hpf (ref 0–5)

## 2024-03-27 LAB — HISTONE ANTIBODIES, IGG, BLOOD: DNA-Histone: 2.1 U — ABNORMAL HIGH (ref 0.0–0.9)

## 2024-03-27 LAB — HEPARIN LEVEL (UNFRACTIONATED): Heparin Unfractionated: 0.55 [IU]/mL (ref 0.30–0.70)

## 2024-03-27 LAB — RENAL FUNCTION PANEL
Albumin: 2 g/dL — ABNORMAL LOW (ref 3.5–5.0)
Anion gap: 10 (ref 5–15)
BUN: 89 mg/dL — ABNORMAL HIGH (ref 8–23)
CO2: 22 mmol/L (ref 22–32)
Calcium: 7.5 mg/dL — ABNORMAL LOW (ref 8.9–10.3)
Chloride: 102 mmol/L (ref 98–111)
Creatinine, Ser: 3.6 mg/dL — ABNORMAL HIGH (ref 0.61–1.24)
GFR, Estimated: 17 mL/min — ABNORMAL LOW (ref >60)
Glucose, Bld: 157 mg/dL — ABNORMAL HIGH (ref 70–99)
Phosphorus: 2.8 mg/dL (ref 2.5–4.6)
Potassium: 5.1 mmol/L (ref 3.5–5.1)
Sodium: 134 mmol/L — ABNORMAL LOW (ref 135–145)

## 2024-03-27 LAB — GLUCOSE, CAPILLARY
Glucose-Capillary: 106 mg/dL — ABNORMAL HIGH (ref 70–99)
Glucose-Capillary: 144 mg/dL — ABNORMAL HIGH (ref 70–99)
Glucose-Capillary: 144 mg/dL — ABNORMAL HIGH (ref 70–99)
Glucose-Capillary: 144 mg/dL — ABNORMAL HIGH (ref 70–99)
Glucose-Capillary: 165 mg/dL — ABNORMAL HIGH (ref 70–99)
Glucose-Capillary: 196 mg/dL — ABNORMAL HIGH (ref 70–99)

## 2024-03-27 LAB — APTT: aPTT: 36 s (ref 24–36)

## 2024-03-27 LAB — LACTIC ACID, PLASMA
Lactic Acid, Venous: 0.9 mmol/L (ref 0.5–1.9)
Lactic Acid, Venous: 1.3 mmol/L (ref 0.5–1.9)

## 2024-03-27 LAB — MRSA NEXT GEN BY PCR, NASAL: MRSA by PCR Next Gen: DETECTED — AB

## 2024-03-27 LAB — MAGNESIUM: Magnesium: 2.9 mg/dL — ABNORMAL HIGH (ref 1.7–2.4)

## 2024-03-27 LAB — C3 COMPLEMENT: C3 Complement: 99 mg/dL (ref 82–167)

## 2024-03-27 LAB — ANA W/REFLEX IF POSITIVE: Anti Nuclear Antibody (ANA): POSITIVE — AB

## 2024-03-27 LAB — PROTEIN / CREATININE RATIO, URINE
Creatinine, Urine: 114 mg/dL
Protein Creatinine Ratio: 3.48 mg/mg{creat} — ABNORMAL HIGH (ref 0.00–0.15)
Total Protein, Urine: 397 mg/dL

## 2024-03-27 LAB — CORTISOL: Cortisol, Plasma: 20.6 ug/dL

## 2024-03-27 LAB — PHOSPHORUS: Phosphorus: 3.7 mg/dL (ref 2.5–4.6)

## 2024-03-27 LAB — C4 COMPLEMENT: Complement C4, Body Fluid: 10 mg/dL — ABNORMAL LOW (ref 12–38)

## 2024-03-27 LAB — ANTISTREPTOLYSIN O TITER: ASO: 50 [IU]/mL (ref 0.0–200.0)

## 2024-03-27 MED ORDER — HYDROCORTISONE SOD SUC (PF) 100 MG IJ SOLR
100.0000 mg | Freq: Three times a day (TID) | INTRAMUSCULAR | Status: DC
  Administered 2024-03-27: 100 mg via INTRAVENOUS
  Filled 2024-03-27: qty 2

## 2024-03-27 MED ORDER — HEPARIN SODIUM (PORCINE) 1000 UNIT/ML DIALYSIS
1000.0000 [IU] | INTRAMUSCULAR | Status: DC | PRN
  Filled 2024-03-27 (×2): qty 3

## 2024-03-27 MED ORDER — POTASSIUM & SODIUM PHOSPHATES 280-160-250 MG PO PACK
2.0000 | PACK | ORAL | Status: DC
  Administered 2024-03-27 (×2): 2
  Filled 2024-03-27 (×4): qty 2

## 2024-03-27 MED ORDER — PRISMASOL BGK 4/2.5 32-4-2.5 MEQ/L EC SOLN: Status: DC

## 2024-03-27 MED ORDER — FUROSEMIDE 10 MG/ML IJ SOLN: 120.0000 mg | Freq: Once | INTRAVENOUS | Status: DC

## 2024-03-27 MED ORDER — ALBUMIN HUMAN 25 % IV SOLN: 25.0000 g | Freq: Once | INTRAVENOUS | Status: DC

## 2024-03-27 MED ORDER — HYDROMORPHONE HCL-NACL 50-0.9 MG/50ML-% IV SOLN
0.5000 mg/h | INTRAVENOUS | Status: DC
  Administered 2024-03-27: 2 mg/h via INTRAVENOUS
  Administered 2024-03-27 – 2024-03-29 (×4): 4 mg/h via INTRAVENOUS
  Filled 2024-03-27 (×5): qty 50

## 2024-03-27 MED ORDER — LACTATED RINGERS IV BOLUS
500.0000 mL | Freq: Once | INTRAVENOUS | Status: AC
  Administered 2024-03-27: 500 mL via INTRAVENOUS

## 2024-03-27 MED ORDER — MUPIROCIN 2 % EX OINT
1.0000 | TOPICAL_OINTMENT | Freq: Two times a day (BID) | CUTANEOUS | Status: AC
  Administered 2024-03-27 – 2024-04-01 (×10): 1 via NASAL
  Filled 2024-03-27 (×3): qty 22

## 2024-03-27 MED ORDER — HYDROMORPHONE HCL 1 MG/ML IJ SOLN
1.0000 mg | Freq: Once | INTRAMUSCULAR | Status: AC
  Administered 2024-03-27: 1 mg via INTRAVENOUS

## 2024-03-27 MED ORDER — PROSOURCE TF20 ENFIT COMPATIBL EN LIQD
60.0000 mL | Freq: Two times a day (BID) | ENTERAL | Status: DC
  Administered 2024-03-27 – 2024-04-16 (×39): 60 mL
  Filled 2024-03-27 (×38): qty 60

## 2024-03-27 MED ORDER — LINEZOLID 600 MG/300ML IV SOLN
600.0000 mg | Freq: Two times a day (BID) | INTRAVENOUS | Status: DC
  Administered 2024-03-27 – 2024-04-01 (×11): 600 mg via INTRAVENOUS
  Filled 2024-03-27 (×11): qty 300

## 2024-03-27 MED ORDER — FENTANYL CITRATE (PF) 50 MCG/ML IJ SOSY: 100.0000 ug | PREFILLED_SYRINGE | Freq: Once | INTRAMUSCULAR | Status: AC

## 2024-03-27 MED ORDER — ORAL CARE MOUTH RINSE
15.0000 mL | OROMUCOSAL | Status: DC
  Administered 2024-03-28 – 2024-03-30 (×27): 15 mL via OROMUCOSAL

## 2024-03-27 MED ORDER — SUCCINYLCHOLINE CHLORIDE 200 MG/10ML IV SOSY
PREFILLED_SYRINGE | INTRAVENOUS | Status: AC
  Filled 2024-03-27: qty 10

## 2024-03-27 MED ORDER — EPINEPHRINE HCL 5 MG/250ML IV SOLN IN NS: 0.5000 ug/min | INTRAVENOUS | Status: DC

## 2024-03-27 MED ORDER — DOCUSATE SODIUM 50 MG/5ML PO LIQD: 100.0000 mg | Freq: Two times a day (BID) | ORAL | Status: DC | PRN

## 2024-03-27 MED ORDER — RENA-VITE PO TABS
1.0000 | ORAL_TABLET | Freq: Every day | ORAL | Status: DC
  Administered 2024-03-27 – 2024-04-18 (×22): 1
  Filled 2024-03-27 (×18): qty 1

## 2024-03-27 MED ORDER — PHENYLEPHRINE 80 MCG/ML (10ML) SYRINGE FOR IV PUSH (FOR BLOOD PRESSURE SUPPORT)
PREFILLED_SYRINGE | INTRAVENOUS | Status: AC
  Filled 2024-03-27: qty 10

## 2024-03-27 MED ORDER — KETAMINE HCL-SODIUM CHLORIDE 1000-0.69 MG/100ML-% IV SOLN
0.5000 mg/kg/h | INTRAVENOUS | Status: DC
  Administered 2024-03-27 – 2024-03-29 (×5): 1 mg/kg/h via INTRAVENOUS
  Filled 2024-03-27 (×5): qty 100

## 2024-03-27 MED ORDER — HEPARIN (PORCINE) 25000 UT/250ML-% IV SOLN
1800.0000 [IU]/h | INTRAVENOUS | Status: AC
  Administered 2024-03-27: 900 [IU]/h via INTRAVENOUS
  Administered 2024-03-28: 1150 [IU]/h via INTRAVENOUS
  Administered 2024-03-29: 1200 [IU]/h via INTRAVENOUS
  Administered 2024-03-30: 1600 [IU]/h via INTRAVENOUS
  Administered 2024-03-31 – 2024-04-01 (×3): 1800 [IU]/h via INTRAVENOUS
  Filled 2024-03-27 (×8): qty 250

## 2024-03-27 MED ORDER — POLYETHYLENE GLYCOL 3350 17 G PO PACK: 17.0000 g | PACK | Freq: Every day | ORAL | Status: DC

## 2024-03-27 MED ORDER — ALBUMIN HUMAN 5 % IV SOLN
25.0000 g | Freq: Once | INTRAVENOUS | Status: AC
  Administered 2024-03-27: 25 g via INTRAVENOUS
  Filled 2024-03-27: qty 500

## 2024-03-27 MED ORDER — DOCUSATE SODIUM 50 MG/5ML PO LIQD: 100.0000 mg | Freq: Two times a day (BID) | ORAL | Status: DC

## 2024-03-27 MED ORDER — INSULIN ASPART 100 UNIT/ML IJ SOLN
0.0000 [IU] | INTRAMUSCULAR | Status: DC
  Administered 2024-03-27: 1 [IU] via SUBCUTANEOUS
  Administered 2024-03-27 – 2024-03-28 (×2): 2 [IU] via SUBCUTANEOUS
  Administered 2024-03-28: 3 [IU] via SUBCUTANEOUS
  Administered 2024-03-28 – 2024-03-29 (×4): 2 [IU] via SUBCUTANEOUS
  Administered 2024-03-29: 3 [IU] via SUBCUTANEOUS
  Administered 2024-03-29: 2 [IU] via SUBCUTANEOUS
  Administered 2024-03-29 (×2): 3 [IU] via SUBCUTANEOUS
  Administered 2024-03-29: 2 [IU] via SUBCUTANEOUS
  Administered 2024-03-30: 3 [IU] via SUBCUTANEOUS
  Administered 2024-03-30: 2 [IU] via SUBCUTANEOUS
  Administered 2024-03-30: 3 [IU] via SUBCUTANEOUS
  Administered 2024-03-30 (×2): 2 [IU] via SUBCUTANEOUS
  Administered 2024-03-30: 3 [IU] via SUBCUTANEOUS
  Administered 2024-03-30 – 2024-03-31 (×2): 2 [IU] via SUBCUTANEOUS
  Administered 2024-03-31: 1 [IU] via SUBCUTANEOUS
  Administered 2024-03-31: 2 [IU] via SUBCUTANEOUS
  Administered 2024-03-31: 1 [IU] via SUBCUTANEOUS
  Administered 2024-03-31: 2 [IU] via SUBCUTANEOUS
  Administered 2024-04-01 (×2): 1 [IU] via SUBCUTANEOUS
  Administered 2024-04-01: 16:00:00 2 [IU] via SUBCUTANEOUS
  Administered 2024-04-01: 23:00:00 1 [IU] via SUBCUTANEOUS
  Administered 2024-04-02 (×2): 3 [IU] via SUBCUTANEOUS
  Administered 2024-04-03 (×4): 1 [IU] via SUBCUTANEOUS
  Administered 2024-04-03: 16:00:00 2 [IU] via SUBCUTANEOUS
  Administered 2024-04-04: 1 [IU] via SUBCUTANEOUS
  Administered 2024-04-04 (×2): 2 [IU] via SUBCUTANEOUS
  Administered 2024-04-04 (×2): 1 [IU] via SUBCUTANEOUS
  Administered 2024-04-04 – 2024-04-05 (×5): 2 [IU] via SUBCUTANEOUS
  Administered 2024-04-05: 1 [IU] via SUBCUTANEOUS
  Administered 2024-04-05 – 2024-04-06 (×3): 2 [IU] via SUBCUTANEOUS
  Administered 2024-04-06 – 2024-04-07 (×3): 1 [IU] via SUBCUTANEOUS
  Administered 2024-04-07 (×2): 2 [IU] via SUBCUTANEOUS
  Administered 2024-04-08 (×2): 1 [IU] via SUBCUTANEOUS
  Administered 2024-04-08: 2 [IU] via SUBCUTANEOUS
  Administered 2024-04-08 (×2): 1 [IU] via SUBCUTANEOUS
  Administered 2024-04-09: 2 [IU] via SUBCUTANEOUS
  Administered 2024-04-09: 1 [IU] via SUBCUTANEOUS
  Administered 2024-04-09: 2 [IU] via SUBCUTANEOUS
  Administered 2024-04-09: 1 [IU] via SUBCUTANEOUS
  Administered 2024-04-09: 3 [IU] via SUBCUTANEOUS
  Administered 2024-04-10 – 2024-04-11 (×8): 2 [IU] via SUBCUTANEOUS
  Administered 2024-04-11: 3 [IU] via SUBCUTANEOUS
  Administered 2024-04-11 – 2024-04-12 (×3): 2 [IU] via SUBCUTANEOUS
  Administered 2024-04-12: 1 [IU] via SUBCUTANEOUS
  Administered 2024-04-12: 2 [IU] via SUBCUTANEOUS
  Administered 2024-04-13: 1 [IU] via SUBCUTANEOUS
  Administered 2024-04-13 (×3): 2 [IU] via SUBCUTANEOUS
  Administered 2024-04-14: 3 [IU] via SUBCUTANEOUS
  Administered 2024-04-14 (×5): 2 [IU] via SUBCUTANEOUS
  Administered 2024-04-15: 1 [IU] via SUBCUTANEOUS
  Administered 2024-04-15: 2 [IU] via SUBCUTANEOUS
  Administered 2024-04-15 – 2024-04-16 (×7): 1 [IU] via SUBCUTANEOUS
  Administered 2024-04-16 – 2024-04-17 (×2): 2 [IU] via SUBCUTANEOUS
  Administered 2024-04-17 (×2): 1 [IU] via SUBCUTANEOUS
  Administered 2024-04-17: 2 [IU] via SUBCUTANEOUS
  Administered 2024-04-17 – 2024-04-18 (×2): 1 [IU] via SUBCUTANEOUS
  Administered 2024-04-18: 2 [IU] via SUBCUTANEOUS
  Administered 2024-04-18 (×2): 1 [IU] via SUBCUTANEOUS
  Administered 2024-04-18: 2 [IU] via SUBCUTANEOUS
  Administered 2024-04-19 – 2024-04-28 (×16): 1 [IU] via SUBCUTANEOUS
  Filled 2024-03-27: qty 2
  Filled 2024-03-27: qty 1
  Filled 2024-03-27: qty 2
  Filled 2024-03-27: qty 1
  Filled 2024-03-27: qty 2
  Filled 2024-03-27: qty 3
  Filled 2024-03-27: qty 1
  Filled 2024-03-27: qty 2
  Filled 2024-03-27 (×2): qty 1
  Filled 2024-03-27 (×2): qty 2
  Filled 2024-03-27 (×5): qty 1
  Filled 2024-03-27: qty 3
  Filled 2024-03-27 (×7): qty 2
  Filled 2024-03-27: qty 1
  Filled 2024-03-27: qty 3
  Filled 2024-03-27: qty 1
  Filled 2024-03-27 (×3): qty 2
  Filled 2024-03-27: qty 3
  Filled 2024-03-27: qty 2
  Filled 2024-03-27 (×3): qty 1
  Filled 2024-03-27: qty 3
  Filled 2024-03-27: qty 1
  Filled 2024-03-27 (×2): qty 2
  Filled 2024-03-27: qty 3
  Filled 2024-03-27 (×3): qty 2
  Filled 2024-03-27: qty 3
  Filled 2024-03-27: qty 1
  Filled 2024-03-27: qty 2
  Filled 2024-03-27: qty 5
  Filled 2024-03-27: qty 3
  Filled 2024-03-27 (×2): qty 1
  Filled 2024-03-27: qty 2
  Filled 2024-03-27: qty 3
  Filled 2024-03-27 (×6): qty 1
  Filled 2024-03-27 (×2): qty 2
  Filled 2024-03-27 (×2): qty 1
  Filled 2024-03-27 (×2): qty 2
  Filled 2024-03-27: qty 1
  Filled 2024-03-27: qty 2
  Filled 2024-03-27 (×2): qty 1
  Filled 2024-03-27: qty 2
  Filled 2024-03-27 (×3): qty 1
  Filled 2024-03-27: qty 2
  Filled 2024-03-27: qty 1
  Filled 2024-03-27 (×4): qty 2
  Filled 2024-03-27: qty 3
  Filled 2024-03-27 (×2): qty 1
  Filled 2024-03-27 (×2): qty 2
  Filled 2024-03-27: qty 3
  Filled 2024-03-27: qty 1
  Filled 2024-03-27: qty 6
  Filled 2024-03-27: qty 3
  Filled 2024-03-27 (×2): qty 2
  Filled 2024-03-27 (×2): qty 1
  Filled 2024-03-27 (×2): qty 2
  Filled 2024-03-27: qty 3
  Filled 2024-03-27 (×3): qty 2

## 2024-03-27 MED ORDER — NOREPINEPHRINE 4 MG/250ML-% IV SOLN: 0.0000 ug/min | INTRAVENOUS | Status: DC

## 2024-03-27 MED ORDER — VASOPRESSIN 20 UNITS/100 ML INFUSION FOR SHOCK
0.0000 [IU]/min | INTRAVENOUS | Status: DC
  Administered 2024-03-27: 0.04 [IU]/min via INTRAVENOUS
  Filled 2024-03-27: qty 100

## 2024-03-27 MED ORDER — MIDAZOLAM HCL 2 MG/2ML IJ SOLN
INTRAMUSCULAR | Status: AC
  Administered 2024-03-27: 2 mg via INTRAVENOUS
  Filled 2024-03-27: qty 2

## 2024-03-27 MED ORDER — NOREPINEPHRINE 16 MG/250ML-% IV SOLN
0.0000 ug/min | INTRAVENOUS | Status: DC
  Filled 2024-03-27: qty 250

## 2024-03-27 MED ORDER — ROCURONIUM BROMIDE 10 MG/ML (PF) SYRINGE: 100.0000 mg | PREFILLED_SYRINGE | Freq: Once | INTRAVENOUS | Status: AC

## 2024-03-27 MED ORDER — NOREPINEPHRINE 4 MG/250ML-% IV SOLN
INTRAVENOUS | Status: AC
  Administered 2024-03-27: 5 ug/min via INTRAVENOUS
  Filled 2024-03-27: qty 250

## 2024-03-27 MED ORDER — MIDAZOLAM HCL 2 MG/2ML IJ SOLN
INTRAMUSCULAR | Status: AC
  Filled 2024-03-27: qty 2

## 2024-03-27 MED ORDER — ORAL CARE MOUTH RINSE: 15.0000 mL | OROMUCOSAL | Status: DC | PRN

## 2024-03-27 MED ORDER — FUROSEMIDE 10 MG/ML IJ SOLN
120.0000 mg | INTRAVENOUS | Status: AC
  Administered 2024-03-27: 120 mg via INTRAVENOUS
  Filled 2024-03-27: qty 10

## 2024-03-27 MED ORDER — KETAMINE HCL 50 MG/5ML IJ SOSY: 50.0000 mg | PREFILLED_SYRINGE | Freq: Once | INTRAMUSCULAR | Status: AC

## 2024-03-27 MED ORDER — METHYLPREDNISOLONE SODIUM SUCC 125 MG IJ SOLR
60.0000 mg | Freq: Every day | INTRAMUSCULAR | Status: DC
  Administered 2024-03-30 – 2024-04-03 (×5): 60 mg via INTRAVENOUS
  Filled 2024-03-27 (×5): qty 2

## 2024-03-27 MED ORDER — EPINEPHRINE HCL 5 MG/250ML IV SOLN IN NS
INTRAVENOUS | Status: AC
  Administered 2024-03-27: 2 ug/min via INTRAVENOUS
  Filled 2024-03-27: qty 250

## 2024-03-27 MED ORDER — HYDROMORPHONE BOLUS VIA INFUSION: 0.2500 mg | INTRAVENOUS | Status: DC | PRN

## 2024-03-27 MED ORDER — ETOMIDATE 2 MG/ML IV SOLN
10.0000 mg | Freq: Once | INTRAVENOUS | Status: AC
  Administered 2024-03-27: 10 mg via INTRAVENOUS

## 2024-03-27 MED ORDER — ROCURONIUM BROMIDE 10 MG/ML (PF) SYRINGE
PREFILLED_SYRINGE | INTRAVENOUS | Status: AC
  Administered 2024-03-27: 60 mg via INTRAVENOUS
  Filled 2024-03-27: qty 10

## 2024-03-27 MED ORDER — POLYETHYLENE GLYCOL 3350 17 G PO PACK
17.0000 g | PACK | Freq: Every day | ORAL | Status: DC | PRN
  Filled 2024-03-27: qty 1

## 2024-03-27 MED ORDER — EPINEPHRINE 1 MG/10ML IV SOSY
PREFILLED_SYRINGE | INTRAVENOUS | Status: AC
  Administered 2024-03-27: 1 mL
  Filled 2024-03-27: qty 10

## 2024-03-27 MED ORDER — SODIUM CHLORIDE 0.9 % IV SOLN
1000.0000 mg | INTRAVENOUS | Status: AC
  Administered 2024-03-27 – 2024-03-29 (×3): 1000 mg via INTRAVENOUS
  Filled 2024-03-27 (×5): qty 16

## 2024-03-27 MED ORDER — KETAMINE HCL 50 MG/5ML IJ SOSY
PREFILLED_SYRINGE | INTRAMUSCULAR | Status: AC
  Administered 2024-03-27: 50 mg via INTRAVENOUS
  Filled 2024-03-27: qty 10

## 2024-03-27 MED ORDER — PROPOFOL 1000 MG/100ML IV EMUL
0.0000 ug/kg/min | INTRAVENOUS | Status: DC
  Administered 2024-03-27: 10 ug/kg/min via INTRAVENOUS
  Administered 2024-03-27 – 2024-03-28 (×5): 30 ug/kg/min via INTRAVENOUS
  Filled 2024-03-27: qty 100
  Filled 2024-03-27: qty 200
  Filled 2024-03-27 (×2): qty 100

## 2024-03-27 MED ORDER — ETOMIDATE 2 MG/ML IV SOLN
INTRAVENOUS | Status: AC
  Filled 2024-03-27: qty 20

## 2024-03-27 MED ORDER — FENTANYL CITRATE (PF) 50 MCG/ML IJ SOSY
PREFILLED_SYRINGE | INTRAMUSCULAR | Status: AC
  Administered 2024-03-27: 100 ug via INTRAVENOUS
  Filled 2024-03-27: qty 2

## 2024-03-27 MED ORDER — NOREPINEPHRINE 16 MG/250ML-% IV SOLN
0.0000 ug/min | INTRAVENOUS | Status: DC
  Administered 2024-03-27: 2 ug/min via INTRAVENOUS

## 2024-03-27 MED ORDER — VITAL 1.5 CAL PO LIQD
1000.0000 mL | ORAL | Status: DC
  Administered 2024-03-27 – 2024-04-05 (×11): 1000 mL
  Filled 2024-03-27 (×3): qty 1000

## 2024-03-27 MED ORDER — MIDAZOLAM HCL (PF) 2 MG/2ML IJ SOLN: 2.0000 mg | Freq: Once | INTRAMUSCULAR | Status: AC

## 2024-03-27 MED ORDER — SODIUM CHLORIDE 0.9 % IV SOLN
1.0000 g | Freq: Three times a day (TID) | INTRAVENOUS | Status: DC
  Administered 2024-03-27 – 2024-03-29 (×7): 1 g via INTRAVENOUS
  Filled 2024-03-27 (×7): qty 20

## 2024-03-27 MED ORDER — MIDAZOLAM HCL (PF) 2 MG/2ML IJ SOLN
1.0000 mg | INTRAMUSCULAR | Status: DC | PRN
  Filled 2024-03-27: qty 2

## 2024-03-27 NOTE — Progress Notes (Signed)
 Advanced Heart Failure Rounding Note  Cardiologist: Dorn Lesches, MD  Chief Complaint: S/p esophageal stent. AHF reconsulted 12/5 for A/C HFpEF.  Subjective:   - 10/6: esoph stent placement - 10/15: S/p EGD with stent repositioning by Dr. Shyrl  - 10/23: Ltd echo EF 60-65%  - 10/24: Barium swallow showed persistent leak  - 11/3: PEG tube placed - 11/11: L CT removed - 11/19: SVT in cephalic vein - 11/26: Stent removed. Esophagram (-) for esophageal leak - 11/28: Duodenitis/Enteritis. GI consulted. + bloody emesis.  - 12/2: EDG w/ ischemic ulcers. Carafate  increased + BID PPI - 12/4: PCCM consulted for increased WOB and progressive hypoxia.  - 12/5: AHF re consulted for A/C HFpEF  Has had long, complicated hospitalization. Day: 52  Transferred to ICU today with acute hypoxic respiratory distress.    On vaso + norepi.   Objective:    Weight Range: 91.8 kg Body mass index is 26.7 kg/m.   Vital Signs:   Temp:  [97.7 F (36.5 C)-98.2 F (36.8 C)] 98.2 F (36.8 C) (12/10 0300) Pulse Rate:  [52-82] 52 (12/10 0800) Resp:  [19-32] 32 (12/10 0830) BP: (118-192)/(52-62) 141/59 (12/10 0830) SpO2:  [88 %-99 %] 88 % (12/10 0800) FiO2 (%):  [100 %] 100 % (12/10 1132) Weight:  [91.8 kg] 91.8 kg (12/10 0300) Last BM Date : 03/25/24  Weight change: Filed Weights   03/25/24 0300 03/26/24 0300 03/27/24 0300  Weight: 77.6 kg 79.6 kg 91.8 kg   Intake/Output:  Intake/Output Summary (Last 24 hours) at 03/27/2024 1303 Last data filed at 03/27/2024 1008 Gross per 24 hour  Intake 1241.03 ml  Output 550 ml  Net 691.03 ml    Physical Exam   General: Intubated. Cor: Regular rate & rhythm.  Lungs: clear Abdomen: soft, nontender, nondistended.  Extremities:R and LLE 2+  Neuro:Intubated Skin:  Diffuse Petechial rash.  Telemetry   SB 50s    Labs    CBC Recent Labs    03/26/24 0809 03/27/24 0500 03/27/24 1152  WBC 17.2* 18.0*  --   NEUTROABS 15.2* 16.1*  --    HGB 8.6* 8.0* 7.8*  HCT 27.7* 25.5* 23.0*  MCV 86.6 87.9  --   PLT 226 229  --    Basic Metabolic Panel Recent Labs    87/91/74 0535 03/26/24 0809 03/27/24 0500 03/27/24 1152  NA 136 135 136 134*  K 4.3 4.8 4.9 6.1*  CL 105 100 103  --   CO2 24 16* 26  --   GLUCOSE 112* 142* 122*  --   BUN 73* 91* 101*  --   CREATININE 2.89* 3.71* 4.30*  --   CALCIUM  7.4* 7.4* 7.7*  --   MG 2.5*  --  2.9*  --   PHOS 3.8  --  3.7  --    Liver Function Tests Recent Labs    03/26/24 0809 03/27/24 0500  AST 50* 59*  ALT 70* 69*  ALKPHOS 73 81  BILITOT 0.6 0.8  PROT 4.2* 4.7*  ALBUMIN  1.5* 2.0*   BNP (last 3 results) Recent Labs    06/27/23 1116 03/22/24 0913 03/25/24 0535  BNP 646.4* 2,404.1* 831.9*   Hemoglobin A1C No results for input(s): HGBA1C in the last 72 hours.  Fasting Lipid Panel No results for input(s): CHOL, HDL, LDLCALC, TRIG, CHOLHDL, LDLDIRECT in the last 72 hours.  Medications:    Scheduled Medications:  atorvastatin   80 mg Per Tube Daily   Chlorhexidine  Gluconate Cloth  6 each  Topical Daily   ezetimibe   10 mg Per Tube Daily   feeding supplement (PROSource TF20)  60 mL Per Tube BID   [name] butt cream   Topical BID   guaiFENesin   10 mL Per Tube Q4H   hydrocortisone  sod succinate (SOLU-CORTEF ) inj  100 mg Intravenous Q8H   insulin  aspart  0-9 Units Subcutaneous Q4H   lidocaine   1 patch Transdermal Q24H   multivitamin  1 tablet Per Tube QHS   mouth rinse  15 mL Mouth Rinse 4 times per day   pantoprazole  (PROTONIX ) IV  40 mg Intravenous Q12H   phenylephrine        saccharomyces boulardii  250 mg Per Tube BID   sodium chloride  flush  10-40 mL Intracatheter Q12H   succinylcholine        sucralfate   1 g Per Tube Q6H    Infusions:  epinephrine  Stopped (03/27/24 1007)   feeding supplement (VITAL 1.5 CAL)     heparin  900 Units/hr (03/27/24 1227)   HYDROmorphone  2 mg/hr (03/27/24 1008)   ketamine  (KETALAR ) adult infusion 1 mg/kg/hr  (03/27/24 1010)   linezolid  (ZYVOX ) IV 600 mg (03/27/24 1300)   meropenem  (MERREM ) IV 1 g (03/27/24 1238)   norepinephrine  (LEVOPHED ) Adult infusion 2 mcg/min (03/27/24 1116)   norepinephrine  (LEVOPHED ) Adult infusion Stopped (03/27/24 0952)   prismasol BGK 4/2.5 500 mL/hr at 03/27/24 1245   prismasol BGK 4/2.5 1,500 mL/hr at 03/27/24 1245   prismasol BGK 4/2.5 400 mL/hr at 03/27/24 1245   promethazine  (PHENERGAN ) injection (IM or IVPB) Stopped (03/18/24 0911)   propofol  (DIPRIVAN ) infusion 10 mcg/kg/min (03/27/24 1137)   vasopressin  0.03 Units/min (03/27/24 1008)    PRN Medications: acetaminophen  (TYLENOL ) oral liquid 160 mg/5 mL, alum & mag hydroxide-simeth, cyclobenzaprine , diclofenac  Sodium, [COMPLETED] diphenoxylate -atropine  **FOLLOWED BY** diphenoxylate -atropine , docusate, glucagon  (human recombinant), guaiFENesin , heparin , HYDROmorphone , lip balm, midazolam  PF, mouth rinse, phenylephrine , polyethylene glycol, promethazine  (PHENERGAN ) injection (IM or IVPB), simethicone , sodium chloride , sodium chloride  flush, succinylcholine , traZODone   Patient Profile    Patient with longstanding history of chronic heart failure with preserved ejection fraction, RV failure with history of pulmonary embolism presents with esophageal perforation.  S/P Stent Esophageal Stent. Stent repositioned 10/15. AHF signed off 11/10. Re-consulted 03/22/24 with A/C HFpEF.   Assessment/Plan   1.  Esophageal Perforation - Boerhaave syndrome.  - CT C/A/P: with pneumomediastinum and large mass-like density in lower esophagus - 10/6: EGD with esophageal stent placed  - 10/15: Stent migrated w/ leak > back to OR for repositioning  - 10/24: barium swallow w persistent leak  - 11/3: PEG placed - on TF and meds by PEG. Tolerating full liquid diet - 11/26: Stent removed. Esophagram (-) for esophageal leak - 12/2: EDG w/ ischemic ulcers. Carafate  increased + BID PPI - GI now signed off.   2. Acute on chronic  HFpEF -: h/o cardiogenic shock with RV failure in the setting of cardiac arrest thought to be caused by acute PE vs ACS in 3/25. Echo at the time showed EF 55%, severe, LVH, G2DD, and severely reduced RV function with strain. There was concern for cardiac sarcoid amyloid, however CMR LGE consistent with ischemic disease. RV on echo 9/25 with complete recovery, EF 60-65%, nl RV function.    - RHC 3/25: Mild PAH with severe RV failure though CO is preserved.   - Repeat echo 10/25 EF 60-65%, RV normal.  - Off GDMT for now due to shock. On Norepi and Epi.  - Fluid management for CRRT -- Continue UNNA boots  3. CAD:  - LHC 3/25: 3v CAD with CTO RCA with L->R collaterals and moderate non-obstructive CAD in L system. Medical management.  - suspect CP related to esophageal issues and not CAD - No S/S    4.  AKI on CKD Stage 3b: Baseline sCr ~ 2.1. - Worsening AKI   -  L RAS. Starting on CRRT today  - May need biopsy. ? Steroids - Dr Rolan discussed with Dr Gearline.   5. PAF:   - s/p TEE/DCCV 3/25 - has converted to AF a couple times this admit - May need IV amio.  - On heparin  drip  6.  H/o DVT/PE:  Bilateral upper and lower DVTs, mixed acute and chronic.  - s/p infrarenal IVC placement 3/25 - V/Q 3/25 with multiple perfusion defects - on Heparin  drip.    7. Carotid stenosis: h/o CVA. TCAR in 6/24. Okay to remain off plavix  at this point. - carotid US  9/25: widely patent carotid stent on R and no stenosis on L   8. Acute Hypoxic Respiratory Failure/PNA/ Exudative Lt Pleural Effusion  - CT placed for pleural effusion. Resp Cx w/ rare yeast, BCx NGTD  -Re-intubated today per CCM.   9. Severe protein calorie malnutrition - Nutrition following. PEG tube + TF  10. HTN - Off meds due to shock.   11. Superficial LUE thrombus - Continue PICC - off eliquis   12. Petechial Rash ? Hydralazine  induced lupus.  -Antihistone antibody 2.1 moderately +  -C4 low. C3 +  -ANCA pending.   13.  Anemia HGb   Greig Mosses, NP 03/27/2024  Advanced Heart Failure Team Pager [phone] (M-F; 7a - 5p)  Please contact [facility] for night-coverage after hours (4p -7a ) and weekends on [email or web address]  Patient seen with NP, I formulated the plan and agree with the above note.   Long-complicated course.  As above, has developed progressive renal failure and petechial rash. BUN 101 with creatinine 4.3 today and respiratory distress. He was intubated and transferred to CCU.  Currently on NE 15, epinephrine  2, vasopressin  0.03 with stable MAP. CVP 13.   Of noted, ANA+, anti-histone Ab+, C4 low.  Hydralazine  and Unasyn  stopped.   He is now on linezolid /meropenem  for PNA coverage.   Bronchoscopy done today, doubt pulmonary vasculitis (possible aspiration).   General: Intubated/sedated.  Neck: JVP 10-12 cm, no thyromegaly or thyroid  nodule.  Lungs: Decreased BS dependently.  CV: Nondisplaced PMI.  Heart regular S1/S2, no S3/S4, no murmur.  1+ ankle edema.  Abdomen: Soft, nontender, no hepatosplenomegaly, no distention.  Skin: Diffuse petechial rash.  Neurologic: Alert and oriented x 3.  Psych: Normal affect. Extremities: No clubbing or cyanosis.  HEENT: Normal.   With diffuse petechial rash, ANA+, anti-histone ab+, low C4, concern for possible drug-induced lupus from hydralazine  with AKI.   - Discussed with nephrology and CCM, would ideally get a renal biopsy.  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   - Consider empiric high dose Solumedrol, if renal biopsy positive could transition to rituximab.  He has a friable esophagus, I worry about steroid use in the setting of his esophageal disease but we may not have much choice at this point.   CVVH with AKI and volume overload (CVP 13), MAP now stabilized and will aim for UF 100 cc/hr net to start.   He is now on pressors post-intubation, suspect due to sedation and vasodilatory shock.  - Repeat echo.  -  Wean pressors as  able, SBP now in 120s.   - Follow co-ox.   Now on empiric coverage with linezolid  + meropenem .   He remains on heparin  gtt for h/o AF and PE/DVT.  Can hold for renal biopsy.   CRITICAL CARE Performed by: Ezra Shuck  Total critical care time: 50 minutes  Critical care time was exclusive of separately billable procedures and treating other patients.  Critical care was necessary to treat or prevent imminent or life-threatening deterioration.  Critical care was time spent personally by me on the following activities: development of treatment plan with patient and/or surrogate as well as nursing, discussions with consultants, evaluation of patient's response to treatment, examination of patient, obtaining history from patient or surrogate, ordering and performing treatments and interventions, ordering and review of laboratory studies, ordering and review of radiographic studies, pulse oximetry and re-evaluation of patient's condition.  Ezra Shuck 03/27/2024 1:44 PM

## 2024-03-27 NOTE — Progress Notes (Signed)
 Patient became more congested overnight and required 10L HFNC to keep saturations in the mid 90's. Hospitalist and cardiologist notified.

## 2024-03-27 NOTE — Significant Event (Signed)
 Rapid Response Event Note   Reason for Call :  Respiratory distress  Initial Focused Assessment:  Patient sitting up in bed, increased WOB using accessory muscles and unable to talk in complete sentences. Patient pallor,  extremely diaphoretic and warm to touch. Rhonchus heard from door, attempted to suction orally with minimal results, very weak and delayed gag reflex. RT unable to pass NTS catheter. Patient with poor cough effort, did produce small pink tinged frothy sputum.   151/56 (80) HR 55 RR 30 O2 85-90% NRB 15L   Interventions/Plan of Care:  Attempt HHFNC CCM consulted, to bedside Tx ICU, likely plan intubation and CRRT Tx 2H10 Son updated by [name] and CCM by phone  Event Summary:  MD Notified: C. Perri MD Call Time: [phone] Arrival Time: 9247 End Time: 1015  Tonna Chiquita POUR, RN

## 2024-03-27 NOTE — Progress Notes (Signed)
 Nutrition Follow-up  DOCUMENTATION CODES:   Severe malnutrition in context of acute illness/injury  INTERVENTION:   Nutrition needs adjusted with change in status, initiation of CRRT, intubation, etc  Change formula back to semi-elemental, non-mixed fiber formula. Trial of Vital 1.5 again. If does not tolerate, will trial Osmolite 1.5 (fiber free) formula or may need to see if we can order Mallie Pinion Peptide 1.5  Tube Feeding via PEG: Vital 1.5 at 65 ml/hr Begin TF at 25 ml/hr, titrate by 10 mL q  6 hours until goal rate of 65 ml/hr Pro-Source TF20 60 mL BID TF at goal rate provides 45 g of protein, 2500 kcals, 1186 mL of free water  over 24 hours  Add Renal MVI daily  Change scheduled bowel regimen (attached to vent order set) to prn  Continue Banatrol TF 60 mL QID via tube as stool bulking agent- each packet provides 5g soluble fiber   NUTRITION DIAGNOSIS:   Severe Malnutrition related to acute illness (may have chronic component due to time frame of reported wt loss) as evidenced by severe fat depletion, moderate muscle depletion.  Continues but being addressed  GOAL:   Patient will meet greater than or equal to 90% of their needs  Not Met but being addressed  MONITOR:   Vent status, TF tolerance, Weight trends, I & O's, Skin, Labs  REASON FOR ASSESSMENT:   Consult Enteral/tube feeding initiation and management  ASSESSMENT:   73 yo male admitted post several days of N/V followed by sudden onset of severe epigastric pain radiating to chest and back. Pt found to have small esophageal perforation above GE junction. PMH includes CAD, chronic HFpEF, HTN, HLD, DVT, CVA, PAF, CKD 3b.  10/05 Admitted 10/06 Esophagogram/barium study: distal esophageal perforation, TPN started 10/07 S/p Esophageal stent, TPN at goal rate of 80 ml/hr 10/08  CLD 10/10 Diet advanced to Dysphagia 3, Calorie Count 10/13 No meal tickets for calorie count, pt reports eating bites only 10/15  Esophageal stent migration with leak on esophagram with return to OR for stent repositioning, likely aspiration event requiring intubation 10/16 Extubated, Chest tube placed 10/17 Limited echo with EF 50-55%, RV mildly reduced 10/20 Intubated early AM, L pleural effusion, pigtail chest tube inserted 10/23 Extubated; limited echo: 60-65% 10/24 Esophagram: persistent leak 10/28 Transfer out of ICU 11/03 PEG tube placement  11/04 TF started 11/05 TF at 20 ml/hr 11/06 TPN decreased to meet 50% of pt's needs, TF at 50 ml/hr,  11/07 TF at 60 ml/hr, multiple loose BM, decrease rate of tube feeds to 45 ml/hr, continue TPN to meet 50% of needs 11/10 Increase TF to 19ml/hr; TPN meet 50% of needs 11/11 Increase TF to goal rate (modified to Vital 1.5 as Mallie Pinion out of stock) 11/12 Changed TF to Vital 1.5 @60ml /hr (goal rate) 11/15 Increased stool output; Changed TF back to KF1.4 @65ml /hr (goal rate) 11/20 TF changed to bolus 11/21 Developed chest pain in the evening; TF back to continuous 11/23 CT chest: small b/l pleural effusions w/ bibasilar atelectasis, PNA not excluded, scattered clusters of nodular density predominantly in upper and right lower lobes 11/25 Xray abd: PEG tube in stomach w/ no contrast extravasation noted 11/26 Esophageal stent removal; advanced to clear liquid diet 11/28 - enteritis; TF on hold; GI consulted 11/29 GI singed off, TF remain on hold 11/30 TF re-initiated at 50ml/hr and titrating up 12/01 TF paused due to severe epigastric pain + N/V, coffee ground output from PEG site; GI re-consulted  12/02  TPN initiated 12/03 GI re-initiated TF at goal rate and full liquid diet; abdominal distention; TF rate reduced to 61ml/hr; GI sign off 12/04 TPN and TF stopped; calorie count started 12/07 Calorie count insufficient; CT A/P: persistent duodenitis and interval development of terminal ileitis 12/08 TFs restarted  12/09 Nephrology consulted, Renal US : chronic atrophic left  kidney, normal appearing right kidney, +GB sludge, GB wall thickening-non specific 12/10 Transferred back to ICU, CRRT initiation, Intubated, Diagnostic bronch  Pt transferring back to ICU today with plans for CRRT initiation and Intubation with diagnostic bronch.  Started on vasopressors, per RN, currently on levophed  and vasopressin . A-line just inserted  Noted TF held around 0800 this AM. Discussed with Dr. Claudene, ok to resume TF today.   Admit Weight: 72.8 kg Current Weight: 91.8 kg (unclear accuracy as wt yesterday 79.6 kg. Noted wt of 69.3 kg on 12/06 but wt of 82 kg the day prior. Wt trends all over the place Lowest Weight: 66.8 kg on 11/06  Noted mention of rash vs petechiae in multiple providers notes. Vitamin C lab pending. CRP 10.8 (03/22/24). Vit K 0.49 (wdl)  Also noted concern for possible autoimmune process. ANA+, anti-histone Ab+, C4 low. Noted possible plan for renal biopsy  +significant inflammation in GI tract; +severe gastroenteritis with ischemic ulcers; +duodenitis with new onset ileitis   GI meds include: Carafate  Suspension q 6 hour per tube Protonix  40 mg BID Pt on Florastor BID since 11/07 Pt receiving mixed fiber in current TF in addition to soluble fiber from Banatrol TF QID Lomotil  prn Miralax  and Colace ordered as scheduled with vent protocol; recommend holding or changing to prn for now, if no BM after restarting TF will re-assess  Labs: BUN 101 Creatinine 4.30 Phosphorus 3.7 (wdl) Potassium 4.9 (wdl) Magnesium  2.9 (H) T.Bili 0.8 (wdl), mildly elevated LFTs  Meds: Reviewed Diet Order:   Diet Order             Diet NPO time specified  Diet effective now                   EDUCATION NEEDS:   Education needs have been addressed  Skin:  Skin Assessment: Reviewed RN Assessment Skin Integrity Issues:: Stage I Stage I: n/a  Last BM:  12/8 type 7 , hx of significant diarrhea this admission  Height:   Ht Readings from Last 1 Encounters:   03/27/24 6' 1 (1.854 m)    Weight:   Wt Readings from Last 1 Encounters:  03/27/24 91.8 kg    BMI:  Body mass index is 26.7 kg/m.  Estimated Nutritional Needs:   Kcal:  2500-2700 kcals  Protein:  140- 165  Fluid:  1L plus UOP   Betsey Finger MS, RDN, LDN, CNSC Registered Dietitian 3 Clinical Nutrition RD Inpatient Contact Info in Amion

## 2024-03-27 NOTE — Procedures (Addendum)
 Arterial Catheter Insertion Procedure Note  Billy [name]  [id number]  July 02, 1950  Date:03/27/24  Time:5:19 PM    Provider Performing: [name]    Procedure: Insertion of Arterial Line (63379) with US  guidance (23062)   Indication(s) Blood pressure monitoring and/or need for frequent ABGs  Consent Obtained pre intubation verbally and on phone with son  Anesthesia None   Time Out Verified patient identification, verified procedure, site/side was marked, verified correct patient position, special equipment/implants available, medications/allergies/relevant history reviewed, required imaging and test results available.   Sterile Technique Maximal sterile technique including full sterile barrier drape, hand hygiene, sterile gown, sterile gloves, mask, hair covering, sterile ultrasound probe cover (if used).   Procedure Description Attempted L radial x 2 but vessels too small to thread wire Area of catheter insertion was cleaned with chlorhexidine  and draped in sterile fashion. With real-time ultrasound guidance an arterial catheter was placed into the left axillary artery.  Appropriate arterial tracings confirmed on monitor.     Complications/Tolerance None; patient tolerated the procedure well.   EBL Minimal   Specimen(s) None

## 2024-03-27 NOTE — Procedures (Addendum)
 Bronchoscopy Procedure Note  Geraldo Haris  [id number]  December 10, 1950  Date:03/27/24  Time:9:38 AM   Provider Performing:Dr. Tobie under direction of [name]   Procedure(s):  Flexible bronchoscopy with bronchial alveolar lavage [phone])  Indication(s) Hemoptysis  Consent Unable to obtain consent due to emergent nature of procedure.  Anesthesia In place for ETT   Time Out Verified patient identification, verified procedure, site/side was marked, verified correct patient position, special equipment/implants available, medications/allergies/relevant history reviewed, required imaging and test results available.   Sterile Technique Usual hand hygiene, masks, gowns, and gloves were used   Procedure Description Bronchoscope advanced through endotracheal tube and into airway.    ETT in good position  Copious frothy bloody secretions  Sequential lavage mostly cleared, there were particulates within BAL query aspiration      Complications/Tolerance None; patient tolerated the procedure well. Chest X-ray is not needed post procedure.   EBL Minimal   Specimen(s) Lingula sequential BAL: combined into single speciment

## 2024-03-27 NOTE — Progress Notes (Signed)
 Brief update note: - ANA +, antihistone Ab +, C4 low - active urine sediment - now intubated on CRRT in the ICU - empiric steroids not favorable given esophageal rupture and multiple ulcers on EGD 03/19/24 - discussed case with AHF and PCCM- need to at least have a conversation about getting a renal biopsy to guide therapy--> initially I thought waiting may be better but given anti-histone Ab + among other things may not be in his best interest esp if we can avoid dialysis with more information - will place order and will d/w IR  Almarie Bonine MD [facility] [phone]

## 2024-03-27 NOTE — Progress Notes (Signed)
 Echocardiogram 2D Echocardiogram has been performed.  Thea Norlander 03/27/2024, 6:42 PM

## 2024-03-27 NOTE — Consult Note (Signed)
 WOC Nurse Consult Note: Reason for Consult: medical device related pressure injury to left posterior thigh from lying on cord.  Intact maroon discoloration,  Incontinence associated skin damage to bilateral buttocks  Wound type:pressure and moisture Pressure Injury POA: No Measurement: Bilateral buttocks with 0.5 cm nonintact lesion with surrounding blanchable erythema Linear maroon discoloration (intact)  x2 to posterior left thigh Wound bed: pink and moist  (buttocks) Drainage (amount, consistency, odor) none  Periwound: intact Due to irritation to penis from condom cath, this was removed and skin exposed to urinary incontinence  Dressing procedure/placement/frequency: Barrier cream to perineal (including buttocks and  penis) irritation twice daily and PRN soilage.  Monitor abrasions to left posterior thigh Will not follow at this time.  Please re-consult if needed.  Darice Cooley MSN, RN, FNP-BC CWON Wound, Ostomy, Continence Nurse Outpatient [facility] [phone] Work cell phone:  [phone]

## 2024-03-27 NOTE — Procedures (Signed)
 Central Venous Catheter Insertion Procedure Note  Billy [name]  [id number]  Aug 30, 1950  Date:03/27/24  Time:10:24 AM   Provider Performing:Vayden Weinand Tobie , Supervised by Dr. Toribio Sharps   Procedure: Insertion of Non-tunneled Central Venous [phone]) with US  guidance (23062)   Indication(s) Hemodialysis  Consent Risks of the procedure as well as the alternatives and risks of each were explained to the patient and/or caregiver.  Consent for the procedure was obtained and is signed in the bedside chart  Anesthesia Topical only with 1% lidocaine    Timeout Verified patient identification, verified procedure, site/side was marked, verified correct patient position, special equipment/implants available, medications/allergies/relevant history reviewed, required imaging and test results available.  Sterile Technique Maximal sterile technique including full sterile barrier drape, hand hygiene, sterile gown, sterile gloves, mask, hair covering, sterile ultrasound probe cover (if used).  Procedure Description Area of catheter insertion was cleaned with chlorhexidine  and draped in sterile fashion.  With real-time ultrasound guidance a HD catheter was placed into the right internal jugular vein. Nonpulsatile blood flow and easy flushing noted in all ports.  The catheter was sutured in place and sterile dressing applied.  Complications/Tolerance None; patient tolerated the procedure well. Chest X-ray is ordered to verify placement for internal jugular or subclavian cannulation.   Chest x-ray is not ordered for femoral cannulation.  EBL Minimal  Specimen(s) None

## 2024-03-27 NOTE — Progress Notes (Addendum)
 PT Cancellation Note  Patient Details Name: Mannix Kroeker MRN: [id number] DOB: [birth year]   Cancelled Treatment:    Reason Eval/Treat Not Completed: (P) Medical issues which prohibited therapy, pt transferred to higher level of care d/t hypoxia with increased supplemental O2 needs. Will check back as pt appropriate and schedule allows to continue with PT POC.  Therisa SAUNDERS. PTA Acute Rehabilitation Services Office: [phone]    Therisa CHRISTELLA Boor 03/27/2024, 9:06 AM
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
## 2024-03-27 NOTE — Plan of Care (Signed)
  Problem: Clinical Measurements: Goal: Will remain free from infection Outcome: Progressing Goal: Respiratory complications will improve Outcome: Progressing Goal: Cardiovascular complication will be avoided Outcome: Progressing   Problem: Activity: Goal: Risk for activity intolerance will decrease Outcome: Progressing   Problem: Safety: Goal: Ability to remain free from injury will improve Outcome: Progressing

## 2024-03-27 NOTE — Progress Notes (Addendum)
 Pharmacy Electrolyte Replacement  Recent Labs:  Recent Labs    03/27/24 0500 03/27/24 1152 03/27/24 1535  K 4.9   < > 5.1  MG 2.9*  --   --   PHOS 3.7  --  2.8  CREATININE 4.30*  --  3.60*   < > = values in this interval not displayed.    Plan:  Phos-Nak 2 packets q4h x 4 doses (2 doses of this given before realized wrong product ordered)  **Kphos neutral should have been ordered with K >4. Safety zone filled out **  (Per pharmacist replacement of phosphorous in CRRT patients protocol)  Vito Ralph, PharmD, BCPS Please see amion for complete clinical pharmacist phone list 03/27/2024 5:04 PM

## 2024-03-27 NOTE — Progress Notes (Signed)
 Pharmacy Antibiotic Note  Billy [name] is a 73 y.o. male admitted on 01/21/2024 with sepsis.  Pharmacy has been consulted for linezolid  + merrem  dosing.  Starting CRRT today  Plan: Merrem  1g q 8 hrs while on CRRT F/u cultures, renal function and clinical course.  Height: 6' 1 (185.4 cm) Weight: 91.8 kg (202 lb 6.1 oz) IBW/kg (Calculated) : 79.9  Temp (24hrs), Avg:97.9 F (36.6 C), Min:97.7 F (36.5 C), Max:98.5 F (36.9 C)  Recent Labs  Lab 03/22/24 0914 03/23/24 0445 03/24/24 0508 03/25/24 0535 03/26/24 0809 03/27/24 0500  WBC  --  14.5* 14.3* 15.6* 17.2* 18.0*  CREATININE 2.16*  --  2.52* 2.89* 3.71* 4.30*    Estimated Creatinine Clearance: 17.8 mL/min (A) (by C-G formula based on SCr of 4.3 mg/dL (H)).    Allergies  Allergen Reactions   Norvasc  [Amlodipine ] Swelling and Other (See Comments)    Excessive BLE swelling Skin discoloration, blotching   Jardiance  [Empagliflozin ] Itching and Other (See Comments)    Patient mentioned penile swelling, pain   Morphine  And Codeine Itching    Opioid-induced pruritus    Thank you for allowing pharmacy to be a part of this patients care.  Harlene Barlow, [employer], BCCP Clinical Pharmacist  03/27/2024 3:19 PM   [facility] pharmacy phone numbers are listed on [email or web address]

## 2024-03-27 NOTE — Treatment Plan (Addendum)
 Will sign off, patient intubated on critical care service.  Please let us  know when we can be of further assistance. [facility] Meliton Monte, MD

## 2024-03-27 NOTE — Progress Notes (Addendum)
 PROGRESS NOTE    Billy [name]  FMW:[id number] DOB: Oct 23, 1950 DOA: 01/21/2024 PCP: Billy Lenis, MD  Chief Complaint  Patient presents with   Abdominal Pain   Emesis    Brief Narrative:   his 73 year old Male with history of cardiac arrest earlier this year, congestive heart failure with reduced EF, Billy [name]-fib, PE presenting with nausea, vomiting found to have esophageal rupture/tear requiring stent placement 01/23/2024.  Swallow study revealed additional leak and had stent repositioning 01/31/2024. There was concerns of gastric content aspiration therefore mechanically ventilated in the ICU.  Bedside ultrasound revealed right-sided pleural effusion requiring chest tube placement with removal of 300 cc of exudative fluid. He was successfully extubated on 10/16. He had increased oxygen  requirement requiring reintubation on 10/20. 10/24 barium swallow showed persistent leak.  Eventually, patient was extubated and transferred out of ICU on 10/28. 11/3 PEG tube placed. 11/19 swelling of right arm showing on ultrasound SVT cephalic vein.    Hospital course complicated by acute hypoxic respiratory, PNA, acute on chronic HFpEF.  He's s/p treatment with abx and diuresis per cardiology.   PEG tube placed on 11/3.  S/p TPN.     11/25 PEG tube placement x-ray shows no extravasation. 11/27 torsemide  resumed for upper extremity edema. 11/28 increased epigastric pain and N/V.  Severe duodenitis on repeat CT.  GI re-consulted.  Made NPO+ice, tube feeds stopped and G-tube placed to gravity drain. 11/29 N/V improved. Still epigastric pain but improved.  Re-trial clear liquids with G tube off drain with TF running.12/1 developed severe epigastric pain with n/v, significant output from G tube initially just tube feeds then coffee-ground color. Also developed melena. Underwent EGD with severe worsened gastroenteritis and multiple ischemic ulcers.   12/3-Started on full liquid diet today with plans to advance to mech soft  diet as tolerated in next 3-5 days per GI. Resumed on TF today, if he tolerated TF and PO, can discontinue TPN.  12/9 worsening AKI.  Renal consulted.    12/10 Rapid response for resp failure, transferred to ICU.  Assessment & Plan:   Principal Problem:   Esophageal rupture Active Problems:   Pleural effusion   Ventilator dependence (HCC)   Acute respiratory failure with hypoxia (HCC)   Protein-calorie malnutrition, severe   Intractable pain   Pressure injury of skin   Acute on chronic systolic CHF (congestive heart failure) (HCC)  Goals of Care He's not currently interested in Billy [name] discussion with palliative care.  His goals are to recover and ultimately to go back home.     Acute Hypoxic Respiratory Failure  Worsening overnight, requiring 10 L By the time I got to bedside, hypoxic in the 80's Note coughing up some blood, hold eliquis  (we'd resumed last night) Rapid response, HHFNC, lasix  120 mg, CXR PCCM consult given his persistent hypoxia despite HHFNC and continued increased WOB  AKI on CKD stage 3B Oliguria Creatinine rising in setting of recent diuresis Received contrast 12/1. Receiving losartan  until 12/7. Follow UA +protein, +RBC's GN labs pending - note low C4.  Negative ASO.  Normal C3.  Pending ANCA, ANA.   CT 12/7 without hydro Hold arb Appreciate renal consult    Billy [name] syndrome : Esophageal rupture: - Last esophagram 02/09/24 showed persistent leak. -11/26 esophageal stent removed with no evidence of extravasation on esophagram in OR - Status post esophageal stent removal on 11/26 - repeat esophagogram 12/1 with no leak - CT surgery signed off 12/2  - Continue PPI IV BID, sucralfate  - Continue IV  dilaudid  0.5mg  q2h PRN - plan to remove PEG tube outpatient. - will need CT surgery follow up outpatient. - CT 12/7 with persistent duodenitis and interval development of terminal ileitis - discussed with GI, recommended continue indefinite PPI - no other new  recs   Severe gastroenteritis: Ischemic ulcer disease: - repeat CT Billy [name]/P 12/1 showed persistent severe circumferential thickening of duodenum, and proximal jejunum, small area of free fluid in abdomen and pelvis, small bilateral pleural effusions, postsurgical esophagus findings. - He continues to have severe epigastric pain with nausea and intermittent vomiting. - EGD 12/2 with severe gastritis, multiple non-bleeding superficial duodenal and jejural ischemic ulcers. - GI recommending indefinite PPI, sucralfate  1 gram QID x 2 months - NPO given above  - GI signed off 12/3 - dietician following   Normochromic Anemia: Coffee Ground output from PEG: Melena - Related to severe gastroenteritis and peptic ulcer disease. - Hb relatively stable - hold eliquis  for now given above - cont PPI IV BID and sucralfate  - transfuse for Hgb goal > 7 - monitor CBC   Acute hypoxic respiratory failure: Aspiration Pneumonia Pleural effusion Acute CHF exacerbation: Respiratory failure multifactorial in the setting of pneumonia, pleural effusion, CHF exacerbation.  - Improved with Lasix  and antibiotics. - pulmonary now signed off, recommending aggressive diuresis (they've signed off 12/5) - appreciate cardiology assistance with diuresis - s/p Unasyn  - strict I/O, daily weights   Severe Malnutrition S/p PEG On full liquid diet  Appreciate RD assistance - calorie count underway - tube feeds currently on hold, will follow with RD   Paroxysmal Billy [name]-fib : - DCCV 07/06/2023 - Amiodarone  200mg  daily.  - eliquis  is currently on hold as below - appreciate cards assistance   History of PE with DVTs: - with IVC filter placed 3/25 - eliquis  on hold - monitor CBC    Hypertension: Hyperlipidemia: - maybe related to intractable pain with nausea and vomiting. - Continue IV labetalol  and IV hydralazine  PRN. - Hydralazine , isordil , coreg  - losartan  on hold with AKI - atorvastatin  80  - Zetia  10     Metabolic acidosis - related to AKI above   Bipolar disorder : - overall stable. - Appears not to be on medications.   Insomnia: --Continue trazodone     Bilateral UE swelling Superficial venous thrombosis : -Doppler US  ruled out DVT, Acute superficial thrombosis of cephalic veins noted - symptomatic management   RUE and b/l thigh ecchymosis and petechiae Initially Noted around PICC site, originally placed 02/05/24. Seems to be improving   Wounds - per discussion with RN today has stage 1 to his bottom, no open wounds --Continue Gerhardt twice daily  --Schedule Lomotil  1 tablet BID and QID PRN --Diarrhea overall has improved. -- continue wound care   Upper back pain: Musculoskeletal in nature imaging done as above 11/22 nonspecific-overall improved --flexeril  prn  --Mobilize    Lung nodules : - multiple nodular densities in R lung - needs outpatient follow up    History of Cardiac arrest 06/2023.     DVT prophylaxis: eliquis  Code Status: full Family Communication: none Disposition:   Status is: Inpatient Remains inpatient appropriate because: need for continued inpatient care   Consultants:   GI Cards PCCM CT surgery  Procedures:  12/2 EGD   11/26 Procedure: - Esophagogastroscopy - Removal of esophageal stent - esophagram with Omnipaque     11/03 Procedure: - Esophagogastroscopy - 36F Percutaneous gastroscopy tube placement   10/20 Chest tube intubation   10/16  Chest tube   10/15  Procedure: - Esophagogastroscopy - adjustment of previous stent   10/6 Procedure: - Esophagogastroscopy - 125 mm mm covered esophageal stent placement  Antimicrobials:  Anti-infectives (From admission, onward)    Start     Dose/Rate Route Frequency Ordered Stop   03/23/24 2000  Ampicillin -Sulbactam (UNASYN ) 3 g in sodium chloride  0.9 % 100 mL IVPB        3 g 200 mL/hr over 30 Minutes Intravenous Every 12 hours 03/23/24 1400 03/25/24 2200   03/21/24  1600  Ampicillin -Sulbactam (UNASYN ) 3 g in sodium chloride  0.9 % 100 mL IVPB  Status:  Discontinued        3 g 200 mL/hr over 30 Minutes Intravenous Every 8 hours 03/21/24 1539 03/23/24 1400   02/22/24 1844  fluconazole  (DIFLUCAN ) tablet 200 mg        200 mg Per Tube Daily 02/22/24 1844 03/03/24 1024   02/22/24 1630  fluconazole  (DIFLUCAN ) tablet 200 mg  Status:  Discontinued        200 mg Oral Daily 02/22/24 1537 02/22/24 1844   02/13/24 0900  piperacillin -tazobactam (ZOSYN ) IVPB 3.375 g  Status:  Discontinued        3.375 g 12.5 mL/hr over 240 Minutes Intravenous Every 8 hours 02/13/24 0812 03/20/24 1600   02/07/24 0800  vancomycin  (VANCOCIN ) IVPB 1000 mg/200 mL premix  Status:  Discontinued        1,000 mg 200 mL/hr over 60 Minutes Intravenous Every 24 hours 02/07/24 0632 02/09/24 0905   02/06/24 2200  meropenem  (MERREM ) 1 g in sodium chloride  0.9 % 100 mL IVPB  Status:  Discontinued        1 g 200 mL/hr over 30 Minutes Intravenous Every 12 hours 02/06/24 1127 02/13/24 0812   02/06/24 1300  vancomycin  (VANCOREADY) IVPB 1750 mg/350 mL  Status:  Discontinued        1,750 mg 175 mL/hr over 120 Minutes Intravenous Every 48 hours 02/04/24 1225 02/04/24 1226   02/06/24 0800  vancomycin  (VANCOCIN ) IVPB 1000 mg/200 mL premix        1,000 mg 200 mL/hr over 60 Minutes Intravenous  Once 02/06/24 0629 02/07/24 0829   02/04/24 1400  meropenem  (MERREM ) 1 g in sodium chloride  0.9 % 100 mL IVPB  Status:  Discontinued        1 g 200 mL/hr over 30 Minutes Intravenous Every 8 hours 02/04/24 1208 02/06/24 1127   02/04/24 1300  linezolid  (ZYVOX ) IVPB 600 mg  Status:  Discontinued        600 mg 300 mL/hr over 60 Minutes Intravenous Every 12 hours 02/04/24 1202 02/04/24 1208   02/04/24 1300  vancomycin  (VANCOREADY) IVPB 2000 mg/400 mL        2,000 mg 200 mL/hr over 120 Minutes Intravenous  Once 02/04/24 1208 02/04/24 1623   02/04/24 1208  vancomycin  variable dose per unstable renal function (pharmacist  dosing)  Status:  Discontinued         Does not apply See admin instructions 02/04/24 1208 02/07/24 0632   01/31/24 1030  fluconazole  (DIFLUCAN ) IVPB 200 mg  Status:  Discontinued        200 mg 100 mL/hr over 60 Minutes Intravenous Daily 01/31/24 0938 02/20/24 1218   01/30/24 1445  piperacillin -tazobactam (ZOSYN ) IVPB 3.375 g  Status:  Discontinued        3.375 g 12.5 mL/hr over 240 Minutes Intravenous Every 8 hours 01/30/24 1354 02/04/24 1203   01/26/24 1000  amoxicillin -clavulanate (AUGMENTIN ) 400-57 MG/5ML suspension 875 mg  Status:  Discontinued        875 mg Oral Every 12 hours 01/26/24 0658 01/30/24 1354   01/26/24 1000  fluconazole  (DIFLUCAN ) 40 MG/ML suspension 200 mg  Status:  Discontinued        200 mg Oral Daily 01/26/24 0658 01/31/24 0938   01/22/24 1445  fluconazole  (DIFLUCAN ) IVPB 400 mg  Status:  Discontinued        400 mg 100 mL/hr over 120 Minutes Intravenous Every 24 hours 01/22/24 1436 01/22/24 1439   01/22/24 1445  fluconazole  (DIFLUCAN ) IVPB 200 mg  Status:  Discontinued        200 mg 100 mL/hr over 60 Minutes Intravenous Every 24 hours 01/22/24 1439 01/26/24 0658   01/21/24 2200  piperacillin -tazobactam (ZOSYN ) IVPB 3.375 g  Status:  Discontinued       Placed in Followed by Linked Group   3.375 g 12.5 mL/hr over 240 Minutes Intravenous Every 8 hours 01/21/24 1607 01/26/24 0658   01/21/24 1615  piperacillin -tazobactam (ZOSYN ) IVPB 3.375 g       Placed in Followed by Linked Group   3.375 g 100 mL/hr over 30 Minutes Intravenous  Once 01/21/24 1607 01/21/24 1651       Subjective: Complaining of SOB  Objective: Vitals:   03/26/24 1800 03/26/24 1947 03/26/24 2300 03/27/24 0300  BP: (!) 118/52 (!) 154/57 (!) 131/55 (!) 172/62  Pulse:  79 76 82  Resp:  (!) 23 19 19   Temp:  98.1 F (36.7 C) 98.1 F (36.7 C) 98.2 F (36.8 C)  TempSrc:  Oral Oral Oral  SpO2:  95% 97% 99%  Weight:    91.8 kg  Height:        Intake/Output Summary (Last 24 hours) at  03/27/2024 0756 Last data filed at 03/27/2024 0650 Gross per 24 hour  Intake 1005.67 ml  Output 550 ml  Net 455.67 ml   Filed Weights   03/25/24 0300 03/26/24 0300 03/27/24 0300  Weight: 77.6 kg 79.6 kg 91.8 kg    Examination:  General exam: acutely ill appearing with complaints of SOB Respiratory system: increased WOB, sitting up at 75 degrees in bed, diffuse rhonchi Cardiovascular system: RRR Gastrointestinal system: mildly distended, nontender Central nervous system: Alert and oriented. No focal neurological deficits. Extremities: anasarca, petechiae to LE's     Data Reviewed: I have personally reviewed following labs and imaging studies  CBC: Recent Labs  Lab 03/23/24 0445 03/24/24 0508 03/25/24 0535 03/26/24 0809 03/27/24 0500  WBC 14.5* 14.3* 15.6* 17.2* 18.0*  NEUTROABS  --   --  13.5* 15.2* 16.1*  HGB 8.4* 9.1* 9.5* 8.6* 8.0*  HCT 27.0* 29.3* 30.2* 27.7* 25.5*  MCV 87.7 86.7 86.3 86.6 87.9  PLT 181 195 224 226 229    Basic Metabolic Panel: Recent Labs  Lab 03/21/24 0345 03/22/24 0600 03/22/24 0914 03/24/24 0508 03/25/24 0535 03/26/24 0809 03/27/24 0500  NA 142  --  138 141 136 135 136  K 4.2  --  4.0 3.9 4.3 4.8 4.9  CL 107  --  107 103 105 100 103  CO2 27  --  24 25 24  16* 26  GLUCOSE 146*  --  178* 114* 112* 142* 122*  BUN 39*  --  47* 61* 73* 91* 101*  CREATININE 1.93*  --  2.16* 2.52* 2.89* 3.71* 4.30*  CALCIUM  7.9*  --  7.5* 7.6* 7.4* 7.4* 7.7*  MG 2.3  --   --  2.4 2.5*  --  2.9*  PHOS 2.2* 2.4*  --  3.2 3.8  --  3.7    GFR: Estimated Creatinine Clearance: 17.8 mL/min (Tyah Acord) (by C-G formula based on SCr of 4.3 mg/dL (H)).  Liver Function Tests: Recent Labs  Lab 03/21/24 0345 03/24/24 0508 03/25/24 0535 03/26/24 0809 03/27/24 0500  AST 31  --  54* 50* 59*  ALT 68*  --  95* 70* 69*  ALKPHOS 63  --  80 73 81  BILITOT 0.5  --  0.5 0.6 0.8  PROT 4.0*  --  4.1* 4.2* 4.7*  ALBUMIN  <1.5* <1.5* <1.5* 1.5* 2.0*    CBG: Recent Labs   Lab 03/26/24 0040 03/26/24 0616 03/26/24 1140 03/27/24 0053 03/27/24 0622  GLUCAP 148* 136* 140* 144* 144*     No results found for this or any previous visit (from the past 240 hours).       Radiology Studies: US  RENAL Result Date: 03/26/2024 CLINICAL DATA:  Acute renal insufficiency EXAM: RENAL / URINARY TRACT ULTRASOUND COMPLETE COMPARISON:  03/24/2024 FINDINGS: Right Kidney: Renal measurements: 12.4 x 6.3 x 5.2 cm = volume: 212.3 mL. Echogenicity within normal limits. No mass or hydronephrosis visualized. Left Kidney: Renal measurements: 8.9 x 4.7 x 2.9 cm = volume: 63.9 mL. Marked left renal cortical atrophy with increased echotexture compatible with medical renal disease. No hydronephrosis or renal mass. Bladder: Bladder is decompressed, limiting its evaluation. Other: There is small volume ascites within the abdomen and pelvis. Gallbladder sludge is incidentally noted, with nonspecific gallbladder wall thickening measuring up to 7 mm. IMPRESSION: 1. Chronic left renal cortical atrophy. 2. Unremarkable right kidney. 3. Ascites, unchanged since recent CT. 4. Biliary sludge, with nonspecific gallbladder wall thickening given the adjacent ascites. Electronically Signed   By: Ozell Daring M.D.   On: 03/26/2024 18:43        Scheduled Meds:  amiodarone   200 mg Per Tube Daily   apixaban   2.5 mg Oral BID   atorvastatin   80 mg Per Tube Daily   carvedilol   12.5 mg Oral BID WC   Chlorhexidine  Gluconate Cloth  6 each Topical Daily   ezetimibe   10 mg Per Tube Daily   feeding supplement  237 mL Oral BID BM   [name] butt cream   Topical BID   guaiFENesin   10 mL Per Tube Q4H   hydrALAZINE   100 mg Per Tube Q8H   isosorbide  dinitrate  40 mg Per Tube TID   lidocaine   1 patch Transdermal Q24H   mouth rinse  15 mL Mouth Rinse 4 times per day   pantoprazole  (PROTONIX ) IV  40 mg Intravenous Q12H   saccharomyces boulardii  250 mg Per Tube BID   sodium chloride  flush  10-40 mL  Intracatheter Q12H   sucralfate   1 g Per Tube Q6H   Continuous Infusions:  feeding supplement (KATE FARMS STANDARD 1.4) Liquid 1,000 mL (03/27/24 0730)   furosemide      promethazine  (PHENERGAN ) injection (IM or IVPB) Stopped (03/18/24 0911)     LOS: 66 days    Time spent: 45 min critical care time for AHRF requiring HHFNC and transfer to ICU.    Meliton Monte, MD [facility]   To contact the attending provider between 7A-7P or the covering provider during after hours 7P-7A, please log into the web site [email or web address] and access using universal [place] password for that web site. If you do not have the password, please call the hospital operator.  03/27/2024, 7:56 AM
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
## 2024-03-27 NOTE — Progress Notes (Signed)
 Billy [name] Progress Note   Assessment/ Plan:    AKI on CKD 3b             - HTN, backdrop of pretty bad L-sided RAS (occlusive)--> so essentially he has 1 functioning kidney and is more susceptible to volume status/ nephrotoxic agents             - off Losartan  and torsemide              - needs to start CRRT vol removal with acute change- consent obtained/ orders written/ in process- all 4K, no heparin  for now  - ANA +, C4 low, + hematuria- will need a biopsy at some point but not the priority now--> will await remaining serologies  - sig inflamed mucosa and ulcers on EGD 03/19/24--> steroids may not be in his best interest until we get more data  - off unasyn  and hydralazine    2.  Petechial rash;             - appears like a drug rash             - stop unasyn , I stopped hydralazine  too   3.  Acute on chronic CHF exacerbation;             - with flash pulm edema  - AHF following now  - CRRT   4.  H/o DVT and PE:             - had RH strain previously             - Eliquis  on hold for now- per primary   5.  Boorehave's syndrome             - s/p repair             - on carafate              - Has a PEG  - EGD 03/19/24 with ulcers   6.  Terminal Ileitis/ Duodenitis             - seen on CT scan 03/24/24   7.  Dispo: inpt  Subjective:    Acutely SOB early this AM, looks like flash pulm edema.  Emergently transferred to 2H, intubated.  Consent obtained for CRRT from Billy [name] (son)--> starting,  Active urine sediment and low C4.   Objective:   BP (!) 141/59   Pulse (!) 52   Temp 98.2 F (36.8 C) (Oral)   Resp (!) 32   Ht 6' 1 (1.854 m)   Wt 91.8 kg   SpO2 (!) 88%   BMI 26.70 kg/m   Intake/Output Summary (Last 24 hours) at 03/27/2024 1017 Last data filed at 03/27/2024 1008 Gross per 24 hour  Intake 1241.03 ml  Output 550 ml  Net 691.03 ml   Weight change: 12.2 kg  Physical Exam: GEN ill-appearing, SOB,  HEENT EOMI EPRRL NECK ++JVD PULM wet and  coarse bilaterally CVRRR ABD + PEG, slightly distended EXT 3+ anasarca, UNNA boots SKIN: petechial nonblanching rash thighs, palms, arms NEURO  + encephalopathic  Imaging: DG CHEST PORT 1 VIEW Result Date: 03/27/2024 EXAM: 1 VIEW(S) XRAY OF THE CHEST 03/27/2024 08:13:59 AM COMPARISON: 03/25/2024 CLINICAL HISTORY: Shortness of breath FINDINGS: LINES, TUBES AND DEVICES: Stable right upper extremity PICC line. Left chest loop recorder device noted. LUNGS AND PLEURA: Increased patchy bilateral airspace opacities. Small bilateral pleural effusions, unchanged. No pneumothorax. HEART AND MEDIASTINUM: Aortic atherosclerosis. No acute abnormality of the  cardiac and mediastinal silhouettes. BONES AND SOFT TISSUES: No acute osseous abnormality. IMPRESSION: 1. Increased patchy bilateral airspace opacities. 2. Small bilateral pleural effusions, unchanged. Electronically signed by: Evalene Coho MD 03/27/2024 08:26 AM EST RP Workstation: [id number]   US  RENAL Result Date: 03/26/2024 CLINICAL DATA:  Acute renal insufficiency EXAM: RENAL / URINARY TRACT ULTRASOUND COMPLETE COMPARISON:  03/24/2024 FINDINGS: Right Kidney: Renal measurements: 12.4 x 6.3 x 5.2 cm = volume: 212.3 mL. Echogenicity within normal limits. No mass or hydronephrosis visualized. Left Kidney: Renal measurements: 8.9 x 4.7 x 2.9 cm = volume: 63.9 mL. Marked left renal cortical atrophy with increased echotexture compatible with medical renal disease. No hydronephrosis or renal mass. Bladder: Bladder is decompressed, limiting its evaluation. Other: There is small volume ascites within the abdomen and pelvis. Gallbladder sludge is incidentally noted, with nonspecific gallbladder wall thickening measuring up to 7 mm. IMPRESSION: 1. Chronic left renal cortical atrophy. 2. Unremarkable right kidney. 3. Ascites, unchanged since recent CT. 4. Biliary sludge, with nonspecific gallbladder wall thickening given the adjacent ascites. Electronically Signed    By: Ozell Daring M.D.   On: 03/26/2024 18:43    Labs: BMET Recent Labs  Lab 03/21/24 0345 03/22/24 0600 03/22/24 0914 03/24/24 0508 03/25/24 0535 03/26/24 0809 03/27/24 0500  NA 142  --  138 141 136 135 136  K 4.2  --  4.0 3.9 4.3 4.8 4.9  CL 107  --  107 103 105 100 103  CO2 27  --  24 25 24  16* 26  GLUCOSE 146*  --  178* 114* 112* 142* 122*  BUN 39*  --  47* 61* 73* 91* 101*  CREATININE 1.93*  --  2.16* 2.52* 2.89* 3.71* 4.30*  CALCIUM  7.9*  --  7.5* 7.6* 7.4* 7.4* 7.7*  PHOS 2.2* 2.4*  --  3.2 3.8  --  3.7   CBC Recent Labs  Lab 03/24/24 0508 03/25/24 0535 03/26/24 0809 03/27/24 0500  WBC 14.3* 15.6* 17.2* 18.0*  NEUTROABS  --  13.5* 15.2* 16.1*  HGB 9.1* 9.5* 8.6* 8.0*  HCT 29.3* 30.2* 27.7* 25.5*  MCV 86.7 86.3 86.6 87.9  PLT 195 224 226 229    Medications:     atorvastatin   80 mg Per Tube Daily   Chlorhexidine  Gluconate Cloth  6 each Topical Daily   ezetimibe   10 mg Per Tube Daily   [name] butt cream   Topical BID   guaiFENesin   10 mL Per Tube Q4H   hydrocortisone  sod succinate (SOLU-CORTEF ) inj  100 mg Intravenous Q8H   insulin  aspart  0-9 Units Subcutaneous Q4H   lidocaine   1 patch Transdermal Q24H   multivitamin  1 tablet Per Tube QHS   mouth rinse  15 mL Mouth Rinse 4 times per day   pantoprazole  (PROTONIX ) IV  40 mg Intravenous Q12H   phenylephrine        saccharomyces boulardii  250 mg Per Tube BID   sodium chloride  flush  10-40 mL Intracatheter Q12H   succinylcholine        sucralfate   1 g Per Tube Q6H    Almarie Bonine MD 03/27/2024, 10:17 AM

## 2024-03-27 NOTE — Procedures (Signed)
 Intubation Procedure Note  Billy [name]  [id number]  12-Jun-1950  Date:03/27/24  Time:9:37 AM   Provider Performing: Dr. Libby [name] under direction of Billy [name]    Procedure: Intubation (31500)  Indication(s) Respiratory Failure  Consent Unable to obtain consent due to emergent nature of procedure.   Anesthesia Etomidate , Versed , Fentanyl , and ketamine    Time Out Verified patient identification, verified procedure, site/side was marked, verified correct patient position, special equipment/implants available, medications/allergies/relevant history reviewed, required imaging and test results available.   Sterile Technique Usual hand hygeine, masks, and gloves were used   Procedure Description Patient positioned in bed supine.  Sedation given as noted above.  Patient was intubated with endotracheal tube using Glidescope.  View was Grade 1 full glottis .  Number of attempts was 1.  Colorimetric CO2 detector was consistent with tracheal placement.   Complications/Tolerance Hemodynamic instability, titrating pressors Chest X-ray is ordered to verify placement.   EBL Minimal   Specimen(s) None

## 2024-03-28 ENCOUNTER — Inpatient Hospital Stay (HOSPITAL_COMMUNITY)

## 2024-03-28 DIAGNOSIS — Z931 Gastrostomy status: Secondary | ICD-10-CM | POA: Diagnosis not present

## 2024-03-28 DIAGNOSIS — Z9911 Dependence on respirator [ventilator] status: Secondary | ICD-10-CM

## 2024-03-28 DIAGNOSIS — J15212 Pneumonia due to Methicillin resistant Staphylococcus aureus: Secondary | ICD-10-CM | POA: Diagnosis not present

## 2024-03-28 DIAGNOSIS — E785 Hyperlipidemia, unspecified: Secondary | ICD-10-CM | POA: Diagnosis not present

## 2024-03-28 DIAGNOSIS — I5033 Acute on chronic diastolic (congestive) heart failure: Secondary | ICD-10-CM | POA: Diagnosis not present

## 2024-03-28 DIAGNOSIS — N179 Acute kidney failure, unspecified: Secondary | ICD-10-CM | POA: Diagnosis not present

## 2024-03-28 DIAGNOSIS — I4891 Unspecified atrial fibrillation: Secondary | ICD-10-CM | POA: Diagnosis not present

## 2024-03-28 DIAGNOSIS — I5021 Acute systolic (congestive) heart failure: Secondary | ICD-10-CM | POA: Diagnosis not present

## 2024-03-28 DIAGNOSIS — R739 Hyperglycemia, unspecified: Secondary | ICD-10-CM | POA: Diagnosis not present

## 2024-03-28 DIAGNOSIS — J9601 Acute respiratory failure with hypoxia: Secondary | ICD-10-CM | POA: Diagnosis not present

## 2024-03-28 DIAGNOSIS — D649 Anemia, unspecified: Secondary | ICD-10-CM | POA: Diagnosis not present

## 2024-03-28 DIAGNOSIS — K223 Perforation of esophagus: Secondary | ICD-10-CM | POA: Diagnosis not present

## 2024-03-28 DIAGNOSIS — N183 Chronic kidney disease, stage 3 unspecified: Secondary | ICD-10-CM | POA: Diagnosis not present

## 2024-03-28 DIAGNOSIS — E43 Unspecified severe protein-calorie malnutrition: Secondary | ICD-10-CM | POA: Diagnosis not present

## 2024-03-28 LAB — RENAL FUNCTION PANEL
Albumin: 1.8 g/dL — ABNORMAL LOW (ref 3.5–5.0)
Albumin: 1.7 g/dL — ABNORMAL LOW (ref 3.5–5.0)
Anion gap: 9 (ref 5–15)
Anion gap: 8 (ref 5–15)
BUN: 61 mg/dL — ABNORMAL HIGH (ref 8–23)
BUN: 51 mg/dL — ABNORMAL HIGH (ref 8–23)
CO2: 22 mmol/L (ref 22–32)
CO2: 27 mmol/L (ref 22–32)
Calcium: 7.7 mg/dL — ABNORMAL LOW (ref 8.9–10.3)
Calcium: 7.4 mg/dL — ABNORMAL LOW (ref 8.9–10.3)
Chloride: 107 mmol/L (ref 98–111)
Chloride: 102 mmol/L (ref 98–111)
Creatinine, Ser: 2.69 mg/dL — ABNORMAL HIGH (ref 0.61–1.24)
Creatinine, Ser: 2.17 mg/dL — ABNORMAL HIGH (ref 0.61–1.24)
GFR, Estimated: 25 mL/min — ABNORMAL LOW (ref >60)
GFR, Estimated: 32 mL/min — ABNORMAL LOW (ref >60)
Glucose, Bld: 195 mg/dL — ABNORMAL HIGH (ref 70–99)
Glucose, Bld: 211 mg/dL — ABNORMAL HIGH (ref 70–99)
Phosphorus: 2.7 mg/dL (ref 2.5–4.6)
Phosphorus: 2.8 mg/dL (ref 2.5–4.6)
Potassium: 5 mmol/L (ref 3.5–5.1)
Potassium: 5.2 mmol/L — ABNORMAL HIGH (ref 3.5–5.1)
Sodium: 138 mmol/L (ref 135–145)
Sodium: 137 mmol/L (ref 135–145)

## 2024-03-28 LAB — POCT I-STAT 7, (LYTES, BLD GAS, ICA,H+H)
Acid-Base Excess: 1 mmol/L (ref 0.0–2.0)
Acid-Base Excess: 1 mmol/L (ref 0.0–2.0)
Bicarbonate: 22.9 mmol/L (ref 20.0–28.0)
Bicarbonate: 23.6 mmol/L (ref 20.0–28.0)
Calcium, Ion: 1.04 mmol/L — ABNORMAL LOW (ref 1.15–1.40)
Calcium, Ion: 1.06 mmol/L — ABNORMAL LOW (ref 1.15–1.40)
HCT: 24 % — ABNORMAL LOW (ref 39.0–52.0)
HCT: 23 % — ABNORMAL LOW (ref 39.0–52.0)
Hemoglobin: 8.2 g/dL — ABNORMAL LOW (ref 13.0–17.0)
Hemoglobin: 7.8 g/dL — ABNORMAL LOW (ref 13.0–17.0)
O2 Saturation: 100 %
O2 Saturation: 100 %
Patient temperature: 36.6
Patient temperature: 36.8
Potassium: 4.8 mmol/L (ref 3.5–5.1)
Potassium: 5 mmol/L (ref 3.5–5.1)
Sodium: 135 mmol/L (ref 135–145)
Sodium: 135 mmol/L (ref 135–145)
TCO2: 24 mmol/L (ref 22–32)
TCO2: 24 mmol/L (ref 22–32)
pCO2 arterial: 25.2 mmHg — ABNORMAL LOW (ref 32–48)
pCO2 arterial: 29 mmHg — ABNORMAL LOW (ref 32–48)
pH, Arterial: 7.566 — ABNORMAL HIGH (ref 7.35–7.45)
pH, Arterial: 7.517 — ABNORMAL HIGH (ref 7.35–7.45)
pO2, Arterial: 142 mmHg — ABNORMAL HIGH (ref 83–108)
pO2, Arterial: 164 mmHg — ABNORMAL HIGH (ref 83–108)

## 2024-03-28 LAB — HEMOGLOBIN AND HEMATOCRIT, BLOOD
HCT: 24.8 % — ABNORMAL LOW (ref 39.0–52.0)
Hemoglobin: 7.8 g/dL — ABNORMAL LOW (ref 13.0–17.0)

## 2024-03-28 LAB — ANCA PROFILE
Anti-MPO Antibodies: 0.2 U (ref 0.0–0.9)
Anti-PR3 Antibodies: <0.2 U (ref 0.0–0.9)
Atypical P-ANCA titer: <1:20 {titer}
C-ANCA: <1:20 {titer}
P-ANCA: <1:20 {titer}

## 2024-03-28 LAB — APTT
aPTT: 38 s — ABNORMAL HIGH (ref 24–36)
aPTT: 53 s — ABNORMAL HIGH (ref 24–36)

## 2024-03-28 LAB — CBC
HCT: 23.7 % — ABNORMAL LOW (ref 39.0–52.0)
Hemoglobin: 7.6 g/dL — ABNORMAL LOW (ref 13.0–17.0)
MCH: 27.2 pg (ref 26.0–34.0)
MCHC: 32.1 g/dL (ref 30.0–36.0)
MCV: 84.9 fL (ref 80.0–100.0)
Platelets: 290 K/uL (ref 150–400)
RBC: 2.79 MIL/uL — ABNORMAL LOW (ref 4.22–5.81)
RDW: 16.3 % — ABNORMAL HIGH (ref 11.5–15.5)
WBC: 20.1 K/uL — ABNORMAL HIGH (ref 4.0–10.5)
nRBC: 0 % (ref 0.0–0.2)

## 2024-03-28 LAB — COOXEMETRY PANEL
Carboxyhemoglobin: 1.5 % (ref 0.5–1.5)
Methemoglobin: <0.7 % (ref 0.0–1.5)
O2 Saturation: 63 %
Total hemoglobin: <6.9 g/dL — CL (ref 12.0–16.0)

## 2024-03-28 LAB — TRIGLYCERIDES: Triglycerides: 72 mg/dL (ref <150)

## 2024-03-28 LAB — HISTONE ANTIBODIES, IGG, BLOOD: DNA-Histone: 1.9 U — ABNORMAL HIGH (ref 0.0–0.9)

## 2024-03-28 LAB — MAGNESIUM: Magnesium: 2.5 mg/dL — ABNORMAL HIGH (ref 1.7–2.4)

## 2024-03-28 LAB — GLUCOSE, CAPILLARY
Glucose-Capillary: 184 mg/dL — ABNORMAL HIGH (ref 70–99)
Glucose-Capillary: 118 mg/dL — ABNORMAL HIGH (ref 70–99)
Glucose-Capillary: 215 mg/dL — ABNORMAL HIGH (ref 70–99)
Glucose-Capillary: 198 mg/dL — ABNORMAL HIGH (ref 70–99)

## 2024-03-28 MED ORDER — SODIUM CHLORIDE 0.9 % IV SOLN: INTRAVENOUS | Status: AC | PRN

## 2024-03-28 MED ADMIN — Famotidine in NaCl 0.9% IV Soln 20 MG/50ML: 20 mg | INTRAVENOUS | NDC 00338519741

## 2024-03-28 MED FILL — Famotidine in NaCl 0.9% IV Soln 20 MG/50ML: 20.0000 mg | INTRAVENOUS | Qty: 50 | Status: AC

## 2024-03-28 NOTE — Progress Notes (Signed)
 OT Cancellation Note  Patient Details Name: Sherod Cisse MRN: [id number] DOB: 05-21-1950   Cancelled Treatment:    Reason Eval/Treat Not Completed: Medical issues which prohibited therapy. Per RN pt intubated, sedated, and not medically appropriate for therapy on this day. Will follow up tomorrow.    Bristyl Mclees C, OT  Acute Rehabilitation Services Office [phone] Secure chat preferred   Adrianne GORMAN Savers 03/28/2024, 7:54 AM

## 2024-03-28 NOTE — Progress Notes (Signed)
 IR asked to evaluate patient for image-guided random renal biopsy. Imaging and clinical information reviewed by Dr. Philip and Dr. Karalee. Dr. Philip also discussed the case with Dr. Rolan Sharps. Patient is critically ill and assessment towards safe biopsy has been ongoing since yesterday afternoon.   Patient currently remains intubated, on CRRT and requiring multiple infusions of pressors. After further discussion with the IR team we have determined the risk outweighs the benefits at this time. If the patient improves and stabilizes to a point where the risk of hemorrhage/injury with the biopsy is minimized, IR can reconsider.   No IR procedure planned and the order will be deleted. Please contact IR with any questions.   Warren Dais, AGACNP-BC 03/28/2024, 3:08 PM

## 2024-03-28 NOTE — Progress Notes (Signed)
 PHARMACY - ANTICOAGULATION  Pharmacy Consult for IV heparin  Indication: afib + hx DVT Brief A/P: aPTT subtherapeutic Increase Heparin   rate  Allergies  Allergen Reactions   Norvasc  [Amlodipine ] Swelling and Other (See Comments)    Excessive BLE swelling Skin discoloration, blotching   Jardiance  [Empagliflozin ] Itching and Other (See Comments)    Patient mentioned penile swelling, pain   Morphine  And Codeine Itching    Opioid-induced pruritus    Patient Measurements: Height: 6' 1 (185.4 cm) Weight: 91.8 kg (202 lb 6.1 oz) IBW/kg (Calculated) : 79.9 HEPARIN  DW (KG): 82.6  Vital Signs: Temp: 98 F (36.7 C) (12/11 0000) Temp Source: Bladder (12/10 2000) Pulse Rate: 57 (12/11 0015)  Labs: Recent Labs    03/25/24 0535 03/26/24 0809 03/27/24 0500 03/27/24 1152 03/27/24 1535 03/27/24 2228  HGB 9.5* 8.6* 8.0* 7.8*  --   --   HCT 30.2* 27.7* 25.5* 23.0*  --   --   PLT 224 226 229  --   --   --   APTT  --   --   --   --  36 38*  HEPARINUNFRC  --   --   --   --   --  0.55  CREATININE 2.89* 3.71* 4.30*  --  3.60*  --     Estimated Creatinine Clearance: 21.3 mL/min (A) (by C-G formula based on SCr of 3.6 mg/dL (H)).   Assessment: 73 y.o. male with h/o Afib and DVT, Eliquis  on hold for heparin   Goal of Therapy:  Heparin  level 0.3-0.7 units/ml; aPTT 66-102 Monitor platelets by anticoagulation protocol: Yes   Plan:  Increase Heparin  1150 units/hr Check aPTT in 8 hours  Cathlyn Arrant, PharmD, BCPS

## 2024-03-28 NOTE — Progress Notes (Signed)
 Interim CCM Progress Note  Son and daughter in regards to patient's current status.  Sherlean Sharps AGACNP-BC   [facility] 03/28/2024, 3:05 PM  Please see [email or web address] for pager details.  From 7A-7P if no response, please call [phone]. After hours, please call ELink [phone].
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
## 2024-03-28 NOTE — Progress Notes (Signed)
 [facility] Progress Note   Assessment/ Plan:    AKI on CKD 3b             - HTN, backdrop of pretty bad L-sided RAS (occlusive)--> so essentially he has 1 functioning kidney and is more susceptible to volume status/ nephrotoxic agents             - off Losartan  and torsemide              - on CRRT- all 4K, no heparin  for now  - ANA +, C4 low, + hematuria, anti-histone Ab +- will need a biopsy--will do Friday- appreciate IR  - sig inflamed mucosa and ulcers on EGD 03/19/24--> review by PCCM suggest ischemic ulcers  - off unasyn  and hydralazine   - started solumedrol 03/27/24   2.  Petechial rash;             - appears like a drug rash             - stop unasyn , I stopped hydralazine  too   3.  Acute on chronic CHF exacerbation;             - on CRRT   4.  H/o DVT and PE:             - had RH strain previously             - Eliquis  on hold for now- per primary  - hep gtt   5.  Boorehave's syndrome             - s/p repair             - on carafate              - Has a PEG  - EGD 03/19/24 with ulcers   6.  Terminal Ileitis/ Duodenitis             - seen on CT scan 03/24/24   7.  Dispo: inpt  Subjective:    Intubated, on CRRT.  D/w AHF and PCCM- will start empiric steroids.  D/w IR- plan for biopsy Friday.  Appreciate all involved.     Objective:   BP (!) 119/52   Pulse (!) 55   Temp (!) 97.5 F (36.4 C)   Resp (!) 22   Ht 6' 1 (1.854 m)   Wt 86.8 kg   SpO2 100%   BMI 25.25 kg/m   Intake/Output Summary (Last 24 hours) at 03/28/2024 0908 Last data filed at 03/28/2024 0800 Gross per 24 hour  Intake 3635.78 ml  Output 7255 ml  Net -3619.22 ml   Weight change: -5 kg  Physical Exam: GEN intubated, sedated HEENT EOMI EPRRL NECK no JVD PULM wet and coarse bilaterally CVRRR ABD + PEG, slightly distended EXT 3+ anasarca, UNNA boots SKIN: petechial nonblanching rash thighs, palms, arms NEURO  + intubated and sedated  Imaging: DG CHEST PORT 1  VIEW Result Date: 03/27/2024 CLINICAL DATA:  Central line placement. EXAM: PORTABLE CHEST 1 VIEW COMPARISON:  Earlier same day radiograph at 8:10 a.m. FINDINGS: Endotracheal tube tip is located approximately 3.8 cm above the carina. Right CVC catheter tip at the mid to lower SVC. Stable right upper extremity PICC line. Cardiomediastinal contours are unchanged. Left chest loop recorder device. Unchanged multifocal patchy bilateral airspace opacities. Small bilateral pleural effusions. No pneumothorax identified. Visualized osseous structures are unchanged. IMPRESSION: 1. Endotracheal tube tip is located approximately 3.8 cm above the carina. 2. Right CVC  catheter tip at the mid to lower SVC. 3. Unchanged multifocal bilateral airspace disease. Electronically Signed   By: Harrietta Sherry M.D.   On: 03/27/2024 11:27   DG CHEST PORT 1 VIEW Result Date: 03/27/2024 EXAM: 1 VIEW(S) XRAY OF THE CHEST 03/27/2024 08:13:59 AM COMPARISON: 03/25/2024 CLINICAL HISTORY: Shortness of breath FINDINGS: LINES, TUBES AND DEVICES: Stable right upper extremity PICC line. Left chest loop recorder device noted. LUNGS AND PLEURA: Increased patchy bilateral airspace opacities. Small bilateral pleural effusions, unchanged. No pneumothorax. HEART AND MEDIASTINUM: Aortic atherosclerosis. No acute abnormality of the cardiac and mediastinal silhouettes. BONES AND SOFT TISSUES: No acute osseous abnormality. IMPRESSION: 1. Increased patchy bilateral airspace opacities. 2. Small bilateral pleural effusions, unchanged. Electronically signed by: Evalene Coho MD 03/27/2024 08:26 AM EST RP Workstation: [id number]   US  RENAL Result Date: 03/26/2024 CLINICAL DATA:  Acute renal insufficiency EXAM: RENAL / URINARY TRACT ULTRASOUND COMPLETE COMPARISON:  03/24/2024 FINDINGS: Right Kidney: Renal measurements: 12.4 x 6.3 x 5.2 cm = volume: 212.3 mL. Echogenicity within normal limits. No mass or hydronephrosis visualized. Left Kidney: Renal  measurements: 8.9 x 4.7 x 2.9 cm = volume: 63.9 mL. Marked left renal cortical atrophy with increased echotexture compatible with medical renal disease. No hydronephrosis or renal mass. Bladder: Bladder is decompressed, limiting its evaluation. Other: There is small volume ascites within the abdomen and pelvis. Gallbladder sludge is incidentally noted, with nonspecific gallbladder wall thickening measuring up to 7 mm. IMPRESSION: 1. Chronic left renal cortical atrophy. 2. Unremarkable right kidney. 3. Ascites, unchanged since recent CT. 4. Biliary sludge, with nonspecific gallbladder wall thickening given the adjacent ascites. Electronically Signed   By: Ozell Daring M.D.   On: 03/26/2024 18:43    Labs: BMET Recent Labs  Lab 03/22/24 0600 03/22/24 0914 03/22/24 0914 03/24/24 0508 03/25/24 0535 03/26/24 0809 03/27/24 0500 03/27/24 1152 03/27/24 1535 03/28/24 0347  NA  --  138   < > 141 136 135 136 134* 134* 138  K  --  4.0   < > 3.9 4.3 4.8 4.9 6.1* 5.1 5.0  CL  --  107  --  103 105 100 103  --  102 107  CO2  --  24  --  25 24 16* 26  --  22 22  GLUCOSE  --  178*  --  114* 112* 142* 122*  --  157* 195*  BUN  --  47*  --  61* 73* 91* 101*  --  89* 61*  CREATININE  --  2.16*  --  2.52* 2.89* 3.71* 4.30*  --  3.60* 2.69*  CALCIUM   --  7.5*  --  7.6* 7.4* 7.4* 7.7*  --  7.5* 7.7*  PHOS 2.4*  --   --  3.2 3.8  --  3.7  --  2.8 2.7   < > = values in this interval not displayed.   CBC Recent Labs  Lab 03/25/24 0535 03/26/24 0809 03/27/24 0500 03/27/24 1152 03/28/24 0347  WBC 15.6* 17.2* 18.0*  --  20.1*  NEUTROABS 13.5* 15.2* 16.1*  --   --   HGB 9.5* 8.6* 8.0* 7.8* 7.6*  HCT 30.2* 27.7* 25.5* 23.0* 23.7*  MCV 86.3 86.6 87.9  --  84.9  PLT 224 226 229  --  290    Medications:     atorvastatin   80 mg Per Tube Daily   Chlorhexidine  Gluconate Cloth  6 each Topical Daily   ezetimibe   10 mg Per Tube Daily   feeding supplement (  PROSource TF20)  60 mL Per Tube BID   [name]  butt cream   Topical BID   guaiFENesin   10 mL Per Tube Q4H   insulin  aspart  0-9 Units Subcutaneous Q4H   lidocaine   1 patch Transdermal Q24H   [START ON 03/30/2024] methylPREDNISolone  (SOLU-MEDROL ) injection  60 mg Intravenous Daily   multivitamin  1 tablet Per Tube QHS   mupirocin  ointment  1 Application Nasal BID   mouth rinse  15 mL Mouth Rinse Q2H   pantoprazole  (PROTONIX ) IV  40 mg Intravenous Q12H   saccharomyces boulardii  250 mg Per Tube BID   sodium chloride  flush  10-40 mL Intracatheter Q12H   sucralfate   1 g Per Tube Q6H    Almarie Bonine MD 03/28/2024, 9:08 AM

## 2024-03-28 NOTE — Progress Notes (Addendum)
 Advanced Heart Failure Rounding Note  Cardiologist: Dorn Lesches, MD  Chief Complaint: S/p esophageal stent. AHF reconsulted 12/5 for A/C HFpEF.  Subjective:   - 10/6: esoph stent placement - 10/15: S/p EGD with stent repositioning by Dr. Shyrl  - 10/23: Ltd echo EF 60-65%  - 10/24: Barium swallow showed persistent leak  - 11/3: PEG tube placed - 11/11: L CT removed - 11/19: SVT in cephalic vein - 11/26: Stent removed. Esophagram (-) for esophageal leak - 11/28: Duodenitis/Enteritis. GI consulted. + bloody emesis.  - 12/2: EDG w/ ischemic ulcers. Carafate  increased + BID PPI - 12/4: PCCM consulted for increased WOB and progressive hypoxia.  - 12/5: AHF re consulted for A/C HFpEF - 12/10: Transferred back to ICU with acute respiratory distress, reintubated. Concern for drug-induced lupus 2/2 hydralazine . CRRT started.   Has had long, complicated hospitalization. Day: 67  Off Epi/vaso, NE @6 .   CRRT running. CVP 5  Intubated and sedated.   Objective:    Weight Range: 86.8 kg Body mass index is 25.25 kg/m.   Vital Signs:   Temp:  [97.3 F (36.3 C)-98.5 F (36.9 C)] 97.5 F (36.4 C) (12/11 0800) Pulse Rate:  [54-75] 55 (12/11 0800) Resp:  [19-34] 22 (12/11 0800) BP: (68-159)/(33-78) 119/52 (12/10 1020) SpO2:  [86 %-100 %] 100 % (12/11 0800) Arterial Line BP: (83-204)/(33-101) 162/72 (12/11 0800) FiO2 (%):  [40 %-100 %] 40 % (12/11 0804) Weight:  [86.8 kg] 86.8 kg (12/11 0600) Last BM Date : 03/27/24  Weight change: Filed Weights   03/26/24 0300 03/27/24 0300 03/28/24 0600  Weight: 79.6 kg 91.8 kg 86.8 kg   Intake/Output:  Intake/Output Summary (Last 24 hours) at 03/28/2024 0828 Last data filed at 03/28/2024 0800 Gross per 24 hour  Intake 3748.28 ml  Output 7255 ml  Net -3506.72 ml    Physical Exam  General:  Intubated and sedated. Diffuse petechial rash Neck: JVD UTA. RIJ HD cath Cor: Brady/reg rate & rhythm.  Lungs: diminished Extremities:  +1-2 BLE edema, + UNNA boots. PICC RUE GI/GU: foley/flexi Neuro: alert & oriented x 3. Affect pleasant.  Telemetry   SB 50s  (Personally reviewed)    Labs    CBC Recent Labs    03/26/24 0809 03/27/24 0500 03/27/24 1152 03/28/24 0347  WBC 17.2* 18.0*  --  20.1*  NEUTROABS 15.2* 16.1*  --   --   HGB 8.6* 8.0* 7.8* 7.6*  HCT 27.7* 25.5* 23.0* 23.7*  MCV 86.6 87.9  --  84.9  PLT 226 229  --  290   Basic Metabolic Panel Recent Labs    87/89/74 0500 03/27/24 1152 03/27/24 1535 03/28/24 0347  NA 136   < > 134* 138  K 4.9   < > 5.1 5.0  CL 103  --  102 107  CO2 26  --  22 22  GLUCOSE 122*  --  157* 195*  BUN 101*  --  89* 61*  CREATININE 4.30*  --  3.60* 2.69*  CALCIUM  7.7*  --  7.5* 7.7*  MG 2.9*  --   --  2.5*  PHOS 3.7  --  2.8 2.7   < > = values in this interval not displayed.   Liver Function Tests Recent Labs    03/26/24 0809 03/27/24 0500 03/27/24 1535 03/28/24 0347  AST 50* 59*  --   --   ALT 70* 69*  --   --   ALKPHOS 73 81  --   --  BILITOT 0.6 0.8  --   --   PROT 4.2* 4.7*  --   --   ALBUMIN  1.5* 2.0* 2.0* 1.8*   BNP (last 3 results) Recent Labs    06/27/23 1116 03/22/24 0913 03/25/24 0535  BNP 646.4* 2,404.1* 831.9*   Hemoglobin A1C No results for input(s): HGBA1C in the last 72 hours.  Fasting Lipid Panel Recent Labs    03/28/24 0347  TRIG 72    Medications:    Scheduled Medications:  atorvastatin   80 mg Per Tube Daily   Chlorhexidine  Gluconate Cloth  6 each Topical Daily   ezetimibe   10 mg Per Tube Daily   feeding supplement (PROSource TF20)  60 mL Per Tube BID   [name] butt cream   Topical BID   guaiFENesin   10 mL Per Tube Q4H   insulin  aspart  0-9 Units Subcutaneous Q4H   lidocaine   1 patch Transdermal Q24H   [START ON 03/30/2024] methylPREDNISolone  (SOLU-MEDROL ) injection  60 mg Intravenous Daily   multivitamin  1 tablet Per Tube QHS   mupirocin  ointment  1 Application Nasal BID   mouth rinse  15 mL Mouth Rinse  Q2H   pantoprazole  (PROTONIX ) IV  40 mg Intravenous Q12H   saccharomyces boulardii  250 mg Per Tube BID   sodium chloride  flush  10-40 mL Intracatheter Q12H   sucralfate   1 g Per Tube Q6H    Infusions:  epinephrine  Stopped (03/28/24 0428)   feeding supplement (VITAL 1.5 CAL) 45 mL/hr at 03/28/24 0800   heparin  1,150 Units/hr (03/28/24 0800)   HYDROmorphone  4 mg/hr (03/28/24 0800)   ketamine  (KETALAR ) adult infusion 1 mg/kg/hr (03/28/24 0800)   linezolid  (ZYVOX ) IV Stopped (03/27/24 2243)   meropenem  (MERREM ) IV Stopped (03/28/24 0537)   methylPREDNISolone  (SOLU-MEDROL ) injection Stopped (03/27/24 1652)   norepinephrine  (LEVOPHED ) Adult infusion 6 mcg/min (03/28/24 0800)   prismasol BGK 4/2.5 500 mL/hr at 03/28/24 0040   prismasol BGK 4/2.5 1,500 mL/hr at 03/28/24 0514   prismasol BGK 4/2.5 400 mL/hr at 03/28/24 0040   promethazine  (PHENERGAN ) injection (IM or IVPB) Stopped (03/18/24 0911)   propofol  (DIPRIVAN ) infusion 30 mcg/kg/min (03/28/24 0800)   vasopressin  Stopped (03/27/24 1030)    PRN Medications: acetaminophen  (TYLENOL ) oral liquid 160 mg/5 mL, alum & mag hydroxide-simeth, cyclobenzaprine , diclofenac  Sodium, [COMPLETED] diphenoxylate -atropine  **FOLLOWED BY** diphenoxylate -atropine , docusate, glucagon  (human recombinant), guaiFENesin , heparin , HYDROmorphone , lip balm, midazolam  PF, mouth rinse, polyethylene glycol, promethazine  (PHENERGAN ) injection (IM or IVPB), simethicone , sodium chloride , sodium chloride  flush, traZODone   Patient Profile  Patient with longstanding history of chronic heart failure with preserved ejection fraction, RV failure with history of pulmonary embolism presents with esophageal perforation.  S/P Stent Esophageal Stent. Stent repositioned 10/15. AHF signed off 11/10. Re-consulted 03/22/24 with A/C HFpEF.  Assessment/Plan  1.  Esophageal Perforation - Boerhaave syndrome.  - CT C/A/P: with pneumomediastinum and large mass-like density in lower  esophagus - 10/6: EGD with esophageal stent placed  - 10/15: Stent migrated w/ leak > back to OR for repositioning  - 10/24: barium swallow w persistent leak  - 11/3: PEG placed - on TF and meds by PEG. Tolerating full liquid diet - 11/26: Stent removed. Esophagram (-) for esophageal leak - 12/2: EDG w/ ischemic ulcers. Carafate  increased + BID PPI - GI now signed off.   2. Acute on chronic HFpEF - h/o cardiogenic shock with RV failure in the setting of cardiac arrest thought to be caused by acute PE vs ACS in 3/25. Echo at the time showed EF  55%, severe, LVH, G2DD, and severely reduced RV function with strain. There was concern for cardiac sarcoid amyloid, however CMR LGE consistent with ischemic disease. RV on echo 9/25 with complete recovery, EF 60-65%, nl RV function.    - RHC 3/25: Mild PAH with severe RV failure though CO is preserved.   - Echo 10/25 EF 60-65%, RV normal.  - Echo 12/25: read pending.  - Off GDMT for now due to shock. On Norepi and Epi.  - Fluid management for CRRT - Continue UNNA boots  3. CAD:  - LHC 3/25: 3v CAD with CTO RCA with L->R collaterals and moderate non-obstructive CAD in L system. Medical management.  - suspect CP related to esophageal issues and not CAD - No S/S    4.  AKI on CKD Stage 3b: Baseline sCr ~ 2.1. - Worsening AKI   - L RAS. Continue CRRT  - May need biopsy. ? Steroids. Plan for renal US  for now.  - Dr Rolan discussed with Dr Gearline.   5. PAF:   - s/p TEE/DCCV 3/25 - has converted to AF a couple times this admit - May need IV amio.  - On heparin  drip  6.  H/o DVT/PE:  Bilateral upper and lower DVTs, mixed acute and chronic.  - s/p infrarenal IVC placement 3/25 - V/Q 3/25 with multiple perfusion defects - on Heparin  drip.    7. Carotid stenosis: h/o CVA. TCAR in 6/24. Okay to remain off plavix  at this point. - carotid US  9/25: widely patent carotid stent on R and no stenosis on L   8. Acute Hypoxic Respiratory Failure/PNA/  Exudative Lt Pleural Effusion  - CT placed for pleural effusion. Resp Cx w/ rare yeast, BCx NGTD  - Re-intubated 12/10 per CCM.   9. Severe protein calorie malnutrition - Nutrition following. PEG tube + TF  10. HTN - Off meds due to shock.   11. Superficial LUE thrombus - Continue PICC - off eliquis   12. Petechial Rash ? Hydralazine  induced lupus.  - With diffuse petechial rash, ANA+, anti-histone ab+, low C4, concern for possible drug-induced lupus from hydralazine  with AKI.   - Discussed with nephrology and CCM, would ideally get a renal biopsy.  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  Plan for renal US  today with possible biopsy tomorrow.  - Consider empiric high dose Solumedrol, if renal biopsy positive could transition to rituximab.  He has a friable esophagus, worry about steroid use in the setting of his esophageal disease but we may not have much choice at this point.   13. Anemia HGb 7.6  CRITICAL CARE Performed by: Beckey LITTIE Coe   Total critical care time: 15 minutes  Critical care time was exclusive of separately billable procedures and treating other patients.  Critical care was necessary to treat or prevent imminent or life-threatening deterioration.  Critical care was time spent personally by me on the following activities: development of treatment plan with patient and/or surrogate as well as nursing, discussions with consultants, evaluation of patient's response to treatment, examination of patient, obtaining history from patient or surrogate, ordering and performing treatments and interventions, ordering and review of laboratory studies, ordering and review of radiographic studies, pulse oximetry and re-evaluation of patient's condition.   Beckey LITTIE Coe, NP 03/28/2024  Advanced Heart Failure Team Pager [phone] (M-F; 7a - 5p)  Please contact [facility] for night-coverage after hours (4p -7a ) and weekends on  [email or web address]  Patient seen with  NP, I formulated the plan and agree with the above note.   Patient is on CVVH, pulling UF net negative 100 cc/hr.  Weight trending down.  He is on NE 5 for BP support.   He is on Solumedrol 1000 mg daily x 3 day burst for possible drug-induced lupus contributing to AKI.   General: NAD Neck: JVP 10-12, no thyromegaly or thyroid  nodule.  Lungs: Clear to auscultation bilaterally with normal respiratory effort. CV: Nondisplaced PMI.  Heart regular S1/S2, no S3/S4, no murmur.  1+ edema to knees.  Abdomen: Soft, nontender, no hepatosplenomegaly, no distention.  Skin: Diffuse petechial rash.   Neurologic: Sedated on vent.  Extremities: No clubbing or cyanosis.  HEENT: Normal.   With diffuse petechial rash, ANA+, anti-histone ab+, and low C4, there is concern for possible drug-induced lupus from hydralazine  with AKI.  ANCA negative.  - Discussed with nephrology and CCM, would ideally get a renal biopsy.  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   - He is now on empiric high dose Solumedrol, if renal biopsy positive could transition to rituximab.  He has a friable esophagus, I worry about steroid use in the setting of his esophageal disease but we may not have much choice at this point.   CVVH ongoing, CVP 13 today.  Continue to aim for UF 100 cc/hr net negative.    He is now on pressors post-intubation, suspect due to sedation and vasodilatory shock. Last co-ox 73%. - Repeat echo pending.  - Wean pressors as able, now on NE 5.    Now on empiric coverage with linezolid  + meropenem .    He remains on heparin  gtt for h/o AF and PE/DVT.  Can hold for renal biopsy.   CRITICAL CARE Performed by: Ezra Shuck  Total critical care time: 35 minutes  Critical care time was exclusive of separately billable procedures and treating other patients.  Critical care was necessary to treat or prevent imminent or life-threatening  deterioration.  Critical care was time spent personally by me on the following activities: development of treatment plan with patient and/or surrogate as well as nursing, discussions with consultants, evaluation of patient's response to treatment, examination of patient, obtaining history from patient or surrogate, ordering and performing treatments and interventions, ordering and review of laboratory studies, ordering and review of radiographic studies, pulse oximetry and re-evaluation of patient's condition.  Ezra Shuck 03/28/2024 1:50 PM

## 2024-03-28 NOTE — Progress Notes (Signed)
 PT Cancellation Note  Patient Details Name: Elison Worrel MRN: [id number] DOB: 1950-06-17   Cancelled Treatment:    Reason Eval/Treat Not Completed: Medical issues which prohibited therapy (Per RN pt intubated, sedated, and not medically appropriate for therapy on this day. Acute PT to follow-up tomorrow.)  Enrica Corliss W, PT, DPT Secure Chat Preferred  Rehab Office [phone]   Kate BRAVO Wendolyn 03/28/2024, 8:27 AM

## 2024-03-28 NOTE — TOC Progression Note (Signed)
 Transition of Care [facility]) - Progression Note    Patient Details  Name: Mareo Portilla MRN: [id number] Date of Birth: 17-Feb-1951  [facility]) CM/SW Contact  Justina Delcia Czar, RN Phone Number: [phone] 03/28/2024, 2:34 PM  Clinical Narrative:    Patient on CRRT, IV abx, and intubated. Pt was scheduled for SNF rehab at [facility] once medically stable.   Chart reviewed for discharge readiness, patient not medically stable for d/c. Inpatient CM/CSW will continue to monitor pt's advancement through interdisciplinary progression rounds.    If new pt transition needs arise, MD please place a TOC consult.      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, SNF Pending bed offersopressors    Expected Discharge Plan and Services In-house Referral: Clinical Social Work Discharge Planning Services: [employer] Consult Post Acute Care Choice: Skilled Nursing Facility Living arrangements for the past 2 months: Single Family Home                    Social Drivers of Health (SDOH) Interventions SDOH Screenings   Food Insecurity: No Food Insecurity (01/22/2024)  Housing: High Risk (01/22/2024)  Transportation Needs: No Transportation Needs (01/22/2024)  Utilities: Not At Risk (01/22/2024)  Social Connections: Socially Isolated (01/22/2024)  Tobacco Use: Low Risk (03/19/2024)    Readmission Risk Interventions    06/27/2023    1:52 PM 10/14/2022   11:29 AM  Readmission Risk Prevention Plan  Post Dischage Appt  Complete  Medication Screening  Complete  Transportation Screening Complete Complete  HRI or Home Care Consult Complete   Social Work Consult for Recovery Care Planning/Counseling Complete   Palliative Care Screening Not Applicable   Medication Review Oceanographer) Referral to Pharmacy

## 2024-03-28 NOTE — Progress Notes (Signed)
 Orthopedic Tech Progress Note Patient Details:  Jader Desai November 23, 1950 [id number]  Ortho Devices Type of Ortho Device: Radio broadcast assistant Ortho Device/Splint Location: Bilateral Ortho Device/Splint Interventions: Ordered, Application, Adjustment   Post Interventions Patient Tolerated: Well Instructions Provided: Adjustment of device  Thersia FALCON Zacherie Honeyman 03/28/2024, 10:12 AM
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
## 2024-03-29 ENCOUNTER — Inpatient Hospital Stay (HOSPITAL_COMMUNITY)

## 2024-03-29 DIAGNOSIS — J15212 Pneumonia due to Methicillin resistant Staphylococcus aureus: Secondary | ICD-10-CM | POA: Diagnosis not present

## 2024-03-29 DIAGNOSIS — N183 Chronic kidney disease, stage 3 unspecified: Secondary | ICD-10-CM | POA: Diagnosis not present

## 2024-03-29 DIAGNOSIS — I5033 Acute on chronic diastolic (congestive) heart failure: Secondary | ICD-10-CM | POA: Diagnosis not present

## 2024-03-29 DIAGNOSIS — I5021 Acute systolic (congestive) heart failure: Secondary | ICD-10-CM

## 2024-03-29 DIAGNOSIS — J9601 Acute respiratory failure with hypoxia: Secondary | ICD-10-CM | POA: Diagnosis not present

## 2024-03-29 DIAGNOSIS — K223 Perforation of esophagus: Secondary | ICD-10-CM | POA: Diagnosis not present

## 2024-03-29 DIAGNOSIS — N179 Acute kidney failure, unspecified: Secondary | ICD-10-CM | POA: Diagnosis not present

## 2024-03-29 LAB — CBC
HCT: 24.1 % — ABNORMAL LOW (ref 39.0–52.0)
Hemoglobin: 7.3 g/dL — ABNORMAL LOW (ref 13.0–17.0)
MCH: 26.6 pg (ref 26.0–34.0)
MCHC: 30.3 g/dL (ref 30.0–36.0)
MCV: 88 fL (ref 80.0–100.0)
Platelets: 305 K/uL (ref 150–400)
RBC: 2.74 MIL/uL — ABNORMAL LOW (ref 4.22–5.81)
RDW: 16.4 % — ABNORMAL HIGH (ref 11.5–15.5)
WBC: 22.4 K/uL — ABNORMAL HIGH (ref 4.0–10.5)
nRBC: 0 % (ref 0.0–0.2)

## 2024-03-29 LAB — RENAL FUNCTION PANEL
Albumin: 1.6 g/dL — ABNORMAL LOW (ref 3.5–5.0)
Albumin: 1.9 g/dL — ABNORMAL LOW (ref 3.5–5.0)
Anion gap: 7 (ref 5–15)
Anion gap: 6 (ref 5–15)
BUN: 41 mg/dL — ABNORMAL HIGH (ref 8–23)
BUN: 39 mg/dL — ABNORMAL HIGH (ref 8–23)
CO2: 25 mmol/L (ref 22–32)
CO2: 27 mmol/L (ref 22–32)
Calcium: 7 mg/dL — ABNORMAL LOW (ref 8.9–10.3)
Calcium: 7.3 mg/dL — ABNORMAL LOW (ref 8.9–10.3)
Chloride: 102 mmol/L (ref 98–111)
Chloride: 100 mmol/L (ref 98–111)
Creatinine, Ser: 1.78 mg/dL — ABNORMAL HIGH (ref 0.61–1.24)
Creatinine, Ser: 1.63 mg/dL — ABNORMAL HIGH (ref 0.61–1.24)
GFR, Estimated: 40 mL/min — ABNORMAL LOW (ref >60)
GFR, Estimated: 45 mL/min — ABNORMAL LOW (ref >60)
Glucose, Bld: 164 mg/dL — ABNORMAL HIGH (ref 70–99)
Glucose, Bld: 223 mg/dL — ABNORMAL HIGH (ref 70–99)
Phosphorus: 2.8 mg/dL (ref 2.5–4.6)
Phosphorus: 3.2 mg/dL (ref 2.5–4.6)
Potassium: 5.1 mmol/L (ref 3.5–5.1)
Potassium: 5.3 mmol/L — ABNORMAL HIGH (ref 3.5–5.1)
Sodium: 134 mmol/L — ABNORMAL LOW (ref 135–145)
Sodium: 133 mmol/L — ABNORMAL LOW (ref 135–145)

## 2024-03-29 LAB — HEPARIN LEVEL (UNFRACTIONATED): Heparin Unfractionated: 0.22 [IU]/mL — ABNORMAL LOW (ref 0.30–0.70)

## 2024-03-29 LAB — GLUCOSE, CAPILLARY
Glucose-Capillary: 160 mg/dL — ABNORMAL HIGH (ref 70–99)
Glucose-Capillary: 176 mg/dL — ABNORMAL HIGH (ref 70–99)
Glucose-Capillary: 186 mg/dL — ABNORMAL HIGH (ref 70–99)
Glucose-Capillary: 199 mg/dL — ABNORMAL HIGH (ref 70–99)
Glucose-Capillary: 206 mg/dL — ABNORMAL HIGH (ref 70–99)
Glucose-Capillary: 214 mg/dL — ABNORMAL HIGH (ref 70–99)
Glucose-Capillary: 225 mg/dL — ABNORMAL HIGH (ref 70–99)

## 2024-03-29 LAB — ECHOCARDIOGRAM COMPLETE
AR max vel: 2.35 cm2
AV Peak grad: 10.5 mmHg
Ao pk vel: 1.62 m/s
Area-P 1/2: 2.76 cm2
Calc EF: 59.1 %
Height: 73 in
S' Lateral: 3.3 cm
Single Plane A2C EF: 59.2 %
Single Plane A4C EF: 60.4 %
Weight: 3238.12 [oz_av]

## 2024-03-29 LAB — MAGNESIUM
Magnesium: 2.4 mg/dL (ref 1.7–2.4)
Magnesium: 2.3 mg/dL (ref 1.7–2.4)

## 2024-03-29 LAB — COOXEMETRY PANEL
Carboxyhemoglobin: 2 % — ABNORMAL HIGH (ref 0.5–1.5)
Methemoglobin: <0.7 % (ref 0.0–1.5)
O2 Saturation: 80.6 %
Total hemoglobin: 7.5 g/dL — ABNORMAL LOW (ref 12.0–16.0)

## 2024-03-29 LAB — HEMOGLOBIN AND HEMATOCRIT, BLOOD
HCT: 28.7 % — ABNORMAL LOW (ref 39.0–52.0)
Hemoglobin: 8.8 g/dL — ABNORMAL LOW (ref 13.0–17.0)

## 2024-03-29 LAB — CULTURE, BAL-QUANTITATIVE W GRAM STAIN: Culture: 20000 — AB

## 2024-03-29 LAB — VITAMIN C: Vitamin C: 0.6 mg/dL (ref 0.4–2.0)

## 2024-03-29 LAB — PREPARE RBC (CROSSMATCH)

## 2024-03-29 LAB — APTT
aPTT: 41 s — ABNORMAL HIGH (ref 24–36)
aPTT: 35 s (ref 24–36)

## 2024-03-29 MED ORDER — AMIODARONE HCL 200 MG PO TABS
200.0000 mg | ORAL_TABLET | Freq: Every day | ORAL | Status: DC
  Administered 2024-03-29 – 2024-03-31 (×3): 200 mg
  Filled 2024-03-29 (×4): qty 1

## 2024-03-29 MED ORDER — CLEVIDIPINE BUTYRATE 0.5 MG/ML IV EMUL
0.0000 mg/h | INTRAVENOUS | Status: DC
  Administered 2024-03-29: 2 mg/h via INTRAVENOUS
  Administered 2024-03-30: 8 mg/h via INTRAVENOUS
  Administered 2024-03-30: 16 mg/h via INTRAVENOUS
  Administered 2024-03-30: 11 mg/h via INTRAVENOUS
  Administered 2024-03-30: 4 mg/h via INTRAVENOUS
  Administered 2024-03-31: 2 mg/h via INTRAVENOUS
  Administered 2024-03-31: 5 mg/h via INTRAVENOUS
  Administered 2024-04-01: 13:00:00 2 mg/h via INTRAVENOUS
  Administered 2024-04-01: 05:00:00 7 mg/h via INTRAVENOUS
  Administered 2024-04-02 (×2): 2 mg/h via INTRAVENOUS
  Filled 2024-03-29: qty 100
  Filled 2024-03-29: qty 200
  Filled 2024-03-29 (×10): qty 100

## 2024-03-29 MED ORDER — PRISMASOL BGK 2/3.5 32-2-3.5 MEQ/L EC SOLN: Status: DC

## 2024-03-29 MED ORDER — HEPARIN SODIUM (PORCINE) 1000 UNIT/ML DIALYSIS
1000.0000 [IU] | INTRAMUSCULAR | Status: DC | PRN
  Administered 2024-04-02: 13:00:00 1200 [IU] via INTRAVENOUS_CENTRAL

## 2024-03-29 MED ORDER — SODIUM CHLORIDE 0.9% IV SOLUTION: Freq: Once | INTRAVENOUS | Status: AC

## 2024-03-29 MED ADMIN — Famotidine in NaCl 0.9% IV Soln 20 MG/50ML: 20 mg | INTRAVENOUS | NDC 00338519741

## 2024-03-29 NOTE — Progress Notes (Signed)
 OT Cancellation Note  Patient Details Name: Billy [name] MRN: [id number] DOB: Nov 25, 1950   Cancelled Treatment:    Reason Eval/Treat Not Completed: Other (comment). RN reporting they are attempting to gradually wake pt, requesting therapy to follow up in PM. Will follow up as able to.  Haydin Calandra C, OT  Acute Rehabilitation Services Office [phone] Secure chat preferred   Adrianne GORMAN Savers 03/29/2024, 8:31 AM

## 2024-03-29 NOTE — Progress Notes (Addendum)
 Advanced Heart Failure Rounding Note  Cardiologist: Dorn Lesches, MD  Chief Complaint: S/p esophageal stent. AHF reconsulted 12/5 for A/C HFpEF.  Subjective:   - 10/6: esoph stent placement - 10/15: S/p EGD with stent repositioning by Dr. Shyrl  - 10/23: Ltd echo EF 60-65%  - 10/24: Barium swallow showed persistent leak  - 11/3: PEG tube placed - 11/11: L CT removed - 11/19: SVT in cephalic vein - 11/26: Stent removed. Esophagram (-) for esophageal leak - 11/28: Duodenitis/Enteritis. GI consulted. + bloody emesis.  - 12/2: EDG w/ ischemic ulcers. Carafate  increased + BID PPI - 12/4: PCCM consulted for increased WOB and progressive hypoxia.  - 12/5: AHF re consulted for A/C HFpEF - 12/10: Transferred back to ICU with acute respiratory distress, reintubated. Concern for drug-induced lupus 2/2 hydralazine . CRRT started.   Has had long, complicated hospitalization. Day: 68  Off Epi/vaso/NE. Weaning down on sedation.   CRRT running. CVP 5  Intubated and sedated.   Objective:    Weight Range: 85.7 kg Body mass index is 24.93 kg/m.   Vital Signs:   Temp:  [97.2 F (36.2 C)-99.3 F (37.4 C)] 98.8 F (37.1 C) (12/12 0815) Pulse Rate:  [51-80] 69 (12/12 0815) Resp:  [13-23] 14 (12/12 0815) SpO2:  [90 %-100 %] 100 % (12/12 0815) Arterial Line BP: (78-202)/(34-152) 125/47 (12/12 0815) FiO2 (%):  [40 %] 40 % (12/12 0600) Weight:  [85.7 kg] 85.7 kg (12/12 0443) Last BM Date : 03/28/24  Weight change: Filed Weights   03/27/24 0300 03/28/24 0600 03/29/24 0443  Weight: 91.8 kg 86.8 kg 85.7 kg   Intake/Output:  Intake/Output Summary (Last 24 hours) at 03/29/2024 0843 Last data filed at 03/29/2024 0800 Gross per 24 hour  Intake 3493.7 ml  Output 7205.8 ml  Net -3712.1 ml    Physical Exam  General:  Intubated and sedated. Diffuse petechial rash Neck: JVD UTA. RIJ HD cath Cor: NSR 70s Lungs: diminished Extremities: +1-2 BLE edema, + UNNA boots. PICC RUE GI/GU:  foley/flexi Neuro: sedated Telemetry   NSR 70s, brief bigeminy early this morning (Personally reviewed)    Labs    CBC Recent Labs    03/27/24 0500 03/27/24 1152 03/28/24 0347 03/28/24 1336 03/28/24 1559 03/29/24 0415  WBC 18.0*  --  20.1*  --   --  22.4*  NEUTROABS 16.1*  --   --   --   --   --   HGB 8.0*   < > 7.6*   < > 7.8* 7.3*  HCT 25.5*   < > 23.7*   < > 24.8* 24.1*  MCV 87.9  --  84.9  --   --  88.0  PLT 229  --  290  --   --  305   < > = values in this interval not displayed.   Basic Metabolic Panel Recent Labs    87/88/74 0347 03/28/24 1336 03/28/24 1556 03/29/24 0415  NA 138   < > 137 134*  K 5.0   < > 5.2* 5.1  CL 107  --  102 102  CO2 22  --  27 25  GLUCOSE 195*  --  211* 164*  BUN 61*  --  51* 41*  CREATININE 2.69*  --  2.17* 1.78*  CALCIUM  7.7*  --  7.4* 7.0*  MG 2.5*  --   --  2.4  PHOS 2.7  --  2.8 2.8   < > = values in this interval not displayed.  Liver Function Tests Recent Labs    03/27/24 0500 03/27/24 1535 03/28/24 1556 03/29/24 0415  AST 59*  --   --   --   ALT 69*  --   --   --   ALKPHOS 81  --   --   --   BILITOT 0.8  --   --   --   PROT 4.7*  --   --   --   ALBUMIN  2.0*   < > 1.7* 1.6*   < > = values in this interval not displayed.   BNP (last 3 results) Recent Labs    06/27/23 1116 03/22/24 0913 03/25/24 0535  BNP 646.4* 2,404.1* 831.9*   Hemoglobin A1C No results for input(s): HGBA1C in the last 72 hours.  Fasting Lipid Panel Recent Labs    03/28/24 0347  TRIG 72    Medications:    Scheduled Medications:  atorvastatin   80 mg Per Tube Daily   Chlorhexidine  Gluconate Cloth  6 each Topical Daily   ezetimibe   10 mg Per Tube Daily   feeding supplement (PROSource TF20)  60 mL Per Tube BID   [name] butt cream   Topical BID   guaiFENesin   10 mL Per Tube Q4H   insulin  aspart  0-9 Units Subcutaneous Q4H   lidocaine   1 patch Transdermal Q24H   [START ON 03/30/2024] methylPREDNISolone  (SOLU-MEDROL )  injection  60 mg Intravenous Daily   multivitamin  1 tablet Per Tube QHS   mupirocin  ointment  1 Application Nasal BID   mouth rinse  15 mL Mouth Rinse Q2H   pantoprazole  (PROTONIX ) IV  40 mg Intravenous Q12H   saccharomyces boulardii  250 mg Per Tube BID   sodium chloride  flush  10-40 mL Intracatheter Q12H   sucralfate   1 g Per Tube Q6H    Infusions:  sodium chloride  10 mL/hr at 03/29/24 0700   epinephrine  Stopped (03/28/24 0428)   famotidine  (PEPCID ) IV Stopped (03/28/24 2110)   feeding supplement (VITAL 1.5 CAL) 65 mL/hr at 03/29/24 0700   heparin  1,200 Units/hr (03/29/24 0700)   HYDROmorphone  4 mg/hr (03/29/24 0700)   ketamine  (KETALAR ) adult infusion 1 mg/kg/hr (03/29/24 0700)   linezolid  (ZYVOX ) IV Stopped (03/28/24 2348)   meropenem  (MERREM ) IV Stopped (03/29/24 0601)   methylPREDNISolone  (SOLU-MEDROL ) injection Stopped (03/28/24 1036)   norepinephrine  (LEVOPHED ) Adult infusion Stopped (03/29/24 0221)   prismasol BGK 4/2.5 500 mL/hr at 03/28/24 2246   prismasol BGK 4/2.5 1,500 mL/hr at 03/29/24 0538   prismasol BGK 4/2.5 400 mL/hr at 03/29/24 0331   promethazine  (PHENERGAN ) injection (IM or IVPB) Stopped (03/18/24 0911)   propofol  (DIPRIVAN ) infusion Stopped (03/28/24 1354)   vasopressin  Stopped (03/27/24 1030)    PRN Medications: sodium chloride , acetaminophen  (TYLENOL ) oral liquid 160 mg/5 mL, alum & mag hydroxide-simeth, diclofenac  Sodium, [COMPLETED] diphenoxylate -atropine  **FOLLOWED BY** diphenoxylate -atropine , docusate, glucagon  (human recombinant), guaiFENesin , heparin , HYDROmorphone , lip balm, midazolam  PF, mouth rinse, polyethylene glycol, promethazine  (PHENERGAN ) injection (IM or IVPB), simethicone , sodium chloride , sodium chloride  flush, traZODone   Patient Profile  Patient with longstanding history of chronic heart failure with preserved ejection fraction, RV failure with history of pulmonary embolism presents with esophageal perforation.  S/P Stent Esophageal  Stent. Stent repositioned 10/15. AHF signed off 11/10. Re-consulted 03/22/24 with A/C HFpEF.  Assessment/Plan  1.  Esophageal Perforation - Boerhaave syndrome.  - CT C/A/P: with pneumomediastinum and large mass-like density in lower esophagus - 10/6: EGD with esophageal stent placed  - 10/15: Stent migrated w/ leak > back to OR for  repositioning  - 10/24: barium swallow w persistent leak  - 11/3: PEG placed - on TF and meds by PEG. Tolerating full liquid diet - 11/26: Stent removed. Esophagram (-) for esophageal leak - 12/2: EDG w/ ischemic ulcers. Carafate  increased + BID PPI - GI now signed off.   2. Acute on chronic HFpEF - h/o cardiogenic shock with RV failure in the setting of cardiac arrest thought to be caused by acute PE vs ACS in 3/25. Echo at the time showed EF 55%, severe, LVH, G2DD, and severely reduced RV function with strain. There was concern for cardiac sarcoid amyloid, however CMR LGE consistent with ischemic disease. RV on echo 9/25 with complete recovery, EF 60-65%, nl RV function.    - RHC 3/25: Mild PAH with severe RV failure though CO is preserved.   - Echo 10/25 EF 60-65%, RV normal.  - Echo 12/25: read pending.  - Off GDMT for now due to shock. Stable off Norepi and Epi.  - Fluid management for CRRT - Continue UNNA boots  3. CAD:  - LHC 3/25: 3v CAD with CTO RCA with L->R collaterals and moderate non-obstructive CAD in L system. Medical management.  - suspect CP related to esophageal issues and not CAD - No S/S    4.  AKI on CKD Stage 3b: Baseline sCr ~ 2.1. - Worsening AKI> CRRT - L RAS. Continue CRRT  - Mgmt per nephrology  5. PAF:   - s/p TEE/DCCV 3/25 - has converted to AF a couple times this admit - May need IV amio. Stable - On heparin  drip  6.  H/o DVT/PE:  Bilateral upper and lower DVTs, mixed acute and chronic.  - s/p infrarenal IVC placement 3/25 - V/Q 3/25 with multiple perfusion defects - on Heparin  drip.    7. Carotid stenosis: h/o CVA.  TCAR in 6/24. Okay to remain off plavix  at this point. - carotid US  9/25: widely patent carotid stent on R and no stenosis on L   8. Acute Hypoxic Respiratory Failure/PNA/ Exudative Lt Pleural Effusion  - CT placed for pleural effusion. Resp Cx w/ rare yeast, BCx NGTD  - Re-intubated 12/10 per CCM.   9. Severe protein calorie malnutrition - Nutrition following. PEG tube + TF  10. HTN - Off meds due to shock.   11. Superficial LUE thrombus - Continue PICC - off eliquis   12. Petechial Rash ? Hydralazine  induced lupus.  - With diffuse petechial rash, ANA+, anti-histone ab+, low C4, concern for possible drug-induced lupus from hydralazine  with AKI.   - Discussed with nephrology and CCM, would ideally get a renal biopsy.  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  - No plan for biopsy at this time per IR d/t increased risk.  - On Solumedrol 1000 mg daily x 3 day burst for possible drug-induced lupus contributing to AKI.   13. Anemia Hgb 7.3, may need 1uPRBC  CRITICAL CARE Performed by: Beckey LITTIE Coe   Total critical care time: 12 minutes  Critical care time was exclusive of separately billable procedures and treating other patients.  Critical care was necessary to treat or prevent imminent or life-threatening deterioration.  Critical care was time spent personally by me on the following activities: development of treatment plan with patient and/or surrogate as well as nursing, discussions with consultants, evaluation of patient's response to treatment, examination of patient, obtaining history from patient or surrogate, ordering and performing treatments and interventions, ordering and review of laboratory  studies, ordering and review of radiographic studies, pulse oximetry and re-evaluation of patient's condition.   Beckey LITTIE Coe, NP 03/29/2024  Advanced Heart Failure Team Pager [phone] (M-F; 7a - 5p)  Please contact [facility] for  night-coverage after hours (4p -7a ) and weekends on [email or web address]  Patient seen with NP, I formulated the plan and agree with the above note.   He is off pressors.  CVP 7 today.  Hgb down to 7.3.  Co-ox 81%.  CVVH ongoing with UF net 100 cc/hr.   Echo from 12/10 reviewed, EF 60-65% with basal inferior akinesis, moderate LVH, normal RV size and mildly decreased systolic function.   He is in NSR today.   He remains on high dose steroids.   General: Intubated/sedated.  Neck: No JVD, no thyromegaly or thyroid  nodule.  Lungs: Clear to auscultation bilaterally with normal respiratory effort. CV: Nondisplaced PMI.  Heart regular S1/S2, no S3/S4, no murmur.  No peripheral edema.  .  Abdomen: Soft, nontender, no hepatosplenomegaly, no distention.  Skin: Diffuse petechial rash.  Neurologic: Alert and oriented x 3.  Psych: Normal affect. Extremities: No clubbing or cyanosis.  HEENT: Normal.   With diffuse petechial rash, ANA+, anti-histone ab+, and low C4, there is concern for possible drug-induced lupus from hydralazine  with AKI.  ANCA negative.  - Discussed with nephrology and CCM, would ideally get a renal biopsy.  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  - He is now on empiric high dose Solumedrol.  He has a friable esophagus, I worry about steroid use in the setting of his esophageal disease but we may not have much choice at this point.    CVVH ongoing, CVP 7 today.  Can decrease UF to 50 cc/hr net negative.    Off pressors, echo as above shows preserved LV EF with mild RV dysfunction.    Now on empiric coverage with linezolid  + meropenem . WBCs higher but on steroids.    He remains on heparin  gtt for h/o AF and PE/DVT.  He is in NSR today.  Will start on po amiodarone  200 bid.   CRITICAL CARE Performed by: Ezra Shuck  Total critical care time: 35 minutes  Critical care time was exclusive  of separately billable procedures and treating other patients.  Critical care was necessary to treat or prevent imminent or life-threatening deterioration.  Critical care was time spent personally by me on the following activities: development of treatment plan with patient and/or surrogate as well as nursing, discussions with consultants, evaluation of patient's response to treatment, examination of patient, obtaining history from patient or surrogate, ordering and performing treatments and interventions, ordering and review of laboratory studies, ordering and review of radiographic studies, pulse oximetry and re-evaluation of patient's condition.  Ezra Shuck 03/29/2024 10:24 AM

## 2024-03-29 NOTE — Progress Notes (Signed)
 [facility] Progress Note   Assessment/ Plan:    AKI on CKD 3b             - HTN, backdrop of pretty bad L-sided RAS (occlusive)--> so essentially he has 1 functioning kidney and is more susceptible to volume status/ nephrotoxic agents             - off Losartan  and torsemide              - on CRRT- all 4K, no heparin  for now  - ANA +, C4 low, + hematuria, anti-histone Ab +- will need a biopsy at some point- appreciate IR  - sig inflamed mucosa and ulcers on EGD 03/19/24--> review by PCCM suggest ischemic ulcers  - off unasyn  and hydralazine   - started solumedrol 03/27/24   2.  Petechial rash;             - appears like a drug rash             - stop unasyn , I stopped hydralazine  too   3.  Acute on chronic CHF exacerbation;             - on CRRT   4.  H/o DVT and PE:             - had RH strain previously             - Eliquis  on hold for now- per primary  - hep gtt   5.  Boorehave's syndrome             - s/p repair             - on carafate              - Has a PEG  - EGD 03/19/24 with ulcers   6.  Terminal Ileitis/ Duodenitis             - seen on CT scan 03/24/24   7.  Dispo: inpt  Subjective:    Continues on CRRT.  Remains intubated. IR consulted and considering biopsy- risk too high at this time- will revisit later.     Objective:   BP (!) 119/52   Pulse 70   Temp 98.8 F (37.1 C)   Resp 14   Ht 6' 1 (1.854 m)   Wt 85.7 kg   SpO2 100%   BMI 24.93 kg/m   Intake/Output Summary (Last 24 hours) at 03/29/2024 1035 Last data filed at 03/29/2024 1000 Gross per 24 hour  Intake 3456.35 ml  Output 7587.8 ml  Net -4131.45 ml   Weight change: -1.1 kg  Physical Exam: GEN intubated, sedated HEENT EOMI EPRRL NECK no JVD PULM wet and coarse bilaterally CVRRR ABD + PEG, slightly distended EXT 3+ anasarca, UNNA boots SKIN: petechial nonblanching rash thighs, palms, arms NEURO  + intubated and sedated  Imaging: DG Chest Port 1 View Result Date:  03/28/2024 CLINICAL DATA:  Acute respiratory failure. EXAM: PORTABLE CHEST 1 VIEW COMPARISON:  03/27/2024 FINDINGS: Right IJ central venous catheter with tip over the SVC. Unchanged and in adequate position. Right-sided PICC line with tip over the SVC. Loop recorder over the left chest unchanged. Lungs are adequately inflated demonstrate interval improvement and bilateral patchy airspace process likely multifocal pneumonia. Stable small bilateral pleural effusions likely with associated basilar atelectasis. Cardiomediastinal silhouette and remainder of the exam is unchanged. IMPRESSION: 1. Interval improvement and bilateral patchy airspace process likely multifocal pneumonia. Stable small bilateral pleural effusions  likely with associated basilar atelectasis. 2. Tubes and lines as described. Electronically Signed   By: Toribio Agreste M.D.   On: 03/28/2024 13:38   DG CHEST PORT 1 VIEW Result Date: 03/27/2024 CLINICAL DATA:  Central line placement. EXAM: PORTABLE CHEST 1 VIEW COMPARISON:  Earlier same day radiograph at 8:10 a.m. FINDINGS: Endotracheal tube tip is located approximately 3.8 cm above the carina. Right CVC catheter tip at the mid to lower SVC. Stable right upper extremity PICC line. Cardiomediastinal contours are unchanged. Left chest loop recorder device. Unchanged multifocal patchy bilateral airspace opacities. Small bilateral pleural effusions. No pneumothorax identified. Visualized osseous structures are unchanged. IMPRESSION: 1. Endotracheal tube tip is located approximately 3.8 cm above the carina. 2. Right CVC catheter tip at the mid to lower SVC. 3. Unchanged multifocal bilateral airspace disease. Electronically Signed   By: Harrietta Sherry M.D.   On: 03/27/2024 11:27    Labs: BMET Recent Labs  Lab 03/24/24 0508 03/25/24 0535 03/26/24 0809 03/27/24 0500 03/27/24 1152 03/27/24 1535 03/28/24 0347 03/28/24 1336 03/28/24 1448 03/28/24 1556 03/29/24 0415  NA 141 136 135 136 134*  134* 138 135 135 137 134*  K 3.9 4.3 4.8 4.9 6.1* 5.1 5.0 4.8 5.0 5.2* 5.1  CL 103 105 100 103  --  102 107  --   --  102 102  CO2 25 24 16* 26  --  22 22  --   --  27 25  GLUCOSE 114* 112* 142* 122*  --  157* 195*  --   --  211* 164*  BUN 61* 73* 91* 101*  --  89* 61*  --   --  51* 41*  CREATININE 2.52* 2.89* 3.71* 4.30*  --  3.60* 2.69*  --   --  2.17* 1.78*  CALCIUM  7.6* 7.4* 7.4* 7.7*  --  7.5* 7.7*  --   --  7.4* 7.0*  PHOS 3.2 3.8  --  3.7  --  2.8 2.7  --   --  2.8 2.8   CBC Recent Labs  Lab 03/25/24 0535 03/26/24 0809 03/27/24 0500 03/27/24 1152 03/28/24 0347 03/28/24 1336 03/28/24 1448 03/28/24 1559 03/29/24 0415  WBC 15.6* 17.2* 18.0*  --  20.1*  --   --   --  22.4*  NEUTROABS 13.5* 15.2* 16.1*  --   --   --   --   --   --   HGB 9.5* 8.6* 8.0*   < > 7.6* 8.2* 7.8* 7.8* 7.3*  HCT 30.2* 27.7* 25.5*   < > 23.7* 24.0* 23.0* 24.8* 24.1*  MCV 86.3 86.6 87.9  --  84.9  --   --   --  88.0  PLT 224 226 229  --  290  --   --   --  305   < > = values in this interval not displayed.    Medications:     sodium chloride    Intravenous Once   amiodarone   200 mg Per Tube Daily   atorvastatin   80 mg Per Tube Daily   Chlorhexidine  Gluconate Cloth  6 each Topical Daily   ezetimibe   10 mg Per Tube Daily   feeding supplement (PROSource TF20)  60 mL Per Tube BID   [name] butt cream   Topical BID   guaiFENesin   10 mL Per Tube Q4H   insulin  aspart  0-9 Units Subcutaneous Q4H   lidocaine   1 patch Transdermal Q24H   [START ON 03/30/2024] methylPREDNISolone  (SOLU-MEDROL ) injection  60 mg  Intravenous Daily   multivitamin  1 tablet Per Tube QHS   mupirocin  ointment  1 Application Nasal BID   mouth rinse  15 mL Mouth Rinse Q2H   pantoprazole  (PROTONIX ) IV  40 mg Intravenous Q12H   saccharomyces boulardii  250 mg Per Tube BID   sodium chloride  flush  10-40 mL Intracatheter Q12H   sucralfate   1 g Per Tube Q6H    Almarie Bonine MD 03/29/2024, 10:35 AM

## 2024-03-29 NOTE — Progress Notes (Signed)
 PT Cancellation Note  Patient Details Name: Billy [name] MRN: [id number] DOB: Feb 02, 1951   Cancelled Treatment:    Reason Eval/Treat Not Completed: (P) Patient not medically ready. RN reporting they are trying to gradually wake pt and requested therapy check back later today when pt is hopefully more alert and able to participate. Will plan to follow-up later as time permits.   Theo Ferretti, PT, DPT Acute Rehabilitation Services  Office: [phone]    Theo CHRISTELLA Ferretti 03/29/2024, 8:28 AM
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
## 2024-03-29 NOTE — Progress Notes (Signed)
 This afternoon awake, able to SBT on 8/5 for 45 min with Vt ~800cc, able to get >1L Vt on command. Lifting head off the pillow, able to swallow on command. Weak but following instructions in extremities. Planning to extubate to [facility].  [name], DO 03/29/2024 5:28 PM [facility]  For contact information, see Amion. If no response to pager, please call PCCM consult pager. After hours, 7PM- 7AM, please call El[facility].

## 2024-03-29 NOTE — Procedures (Signed)
 Extubation Procedure Note  Patient Details:   Name: Billy [name] DOB: October 09, 1950 MRN: [id number]   Airway Documentation:    Vent end date: 03/29/24 Vent end time: 1736   Evaluation  O2 sats: stable throughout Complications: No apparent complications Patient did tolerate procedure well. Bilateral Breath Sounds: Clear, Diminished   Yes  Positive cuff leak prior to extubation. Pt placed on 4L [employer]  Dena VEAR Samuel 03/29/2024, 5:37 PM
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
## 2024-03-29 NOTE — Progress Notes (Signed)
 Occupational Therapy Re-Eval Patient Details Name: Billy [name] MRN: [id number] DOB: 17-Jan-1951 Today's Date: 03/29/2024   History of present illness Billy [name] is a 73 y.o. male who presented to [facility] ED 01/21/24 with several days of vomiting followed by an episode of severe tearing epigastric pain. Work-up revealed esophageal tear. Pt s/p esophageal stent with EGD 10/7. Attempted stent repositioning on 10/15 and was hypoxic, was intubated.  Reintubated 10/20-10/24. G tube placed 11/3. He accidentally pulled out the R chest tube on 11/7.L chest tube removed 11/11. Found to have superficial venous thrombosis of RUE 11/19. N&V with epigastirc pain 11/28, found severe duodenitis on repeat CT. S/P EGD on 12/2. 12/10 re-intubated due to respiratory distress, CRRT started. PMHx: HFrEF, AFib, CAD, PE complicated by cardiogenic shock, CKD 4, MI, peripheral neuropathy, and TIA.   OT comments  Re-eval completed d/t pt being re-intubated and starting CRRT. Pt lethargic, able to follow 90% of commands with increased time. Pt with fixed midline gaze, attempting to track outside of midline but pt unable to complete. Grip strength WFL, but bil shoulders and biceps unable to achieve full ROM against gravity. Pt unable to complete hand to mouth d/t generalized weakness, therefore total assist for ADLs. Updated goals, and d/c disposition to [facility].      If plan is discharge home, recommend the following:  Two people to help with walking and/or transfers;Two people to help with bathing/dressing/bathroom;Assistance with cooking/housework;Assistance with feeding;Assist for transportation   Equipment Recommendations  Other (comment) (Defer)       Precautions / Restrictions Precautions Precautions: Fall Recall of Precautions/Restrictions: Impaired Precaution/Restrictions Comments: Intubated, G2, CRRT, rectal tube Restrictions Weight Bearing Restrictions Per Provider Order: No       Mobility Bed Mobility Overal bed  mobility: Needs Assistance     General bed mobility comments: Transitioned bed to egress. Pt min +2 assist to  lift trunk from EOB    Transfers   General transfer comment: Deferred d/t arousal levels     Balance Overall balance assessment: Needs assistance       ADL either performed or assessed with clinical judgement   ADL Overall ADL's : Needs assistance/impaired Eating/Feeding: NPO     General ADL Comments: total assist d/t Bil shoulder weakness. Unable to bring hand to face    Extremity/Trunk Assessment Upper Extremity Assessment Upper Extremity Assessment: Generalized weakness (Edema, Bil shoulder weakness, grip strength WFL)   Lower Extremity Assessment Lower Extremity Assessment: Defer to PT evaluation        Vision   Vision Assessment?: Vision impaired- to be further tested in functional context Additional Comments: Suspect d/t weaning sedation. Superior fixated gaze, attempting to track outside of midline but unsucccessful         Communication Communication Communication: Impaired Factors Affecting Communication: Hearing impaired;Difficulty expressing self   Cognition Arousal: Lethargic Behavior During Therapy: Flat affect Cognition: Difficult to assess Difficult to assess due to: Intubated           OT - Cognition Comments: Following simple commands 90% of the time. Delayed response time       Following commands: Impaired Following commands impaired: Follows one step commands with increased time      Cueing   Cueing Techniques: Verbal cues, Tactile cues  Exercises Exercises: Other exercises Other Exercises Other Exercises: x 5 shoulder shrugs       General Comments Vent 40% PEEP 5    Pertinent Vitals/ Pain       Pain Assessment Pain Assessment: Faces Faces  Pain Scale: Hurts a little bit Pain Location: generalized Pain Descriptors / Indicators: Discomfort Pain Intervention(s): Limited activity within patient's  tolerance   Frequency  Min 2X/week        Progress Toward Goals  OT Goals(current goals can now be found in the care plan section)  Progress towards OT goals: Goals updated  Acute Rehab OT Goals OT Goal Formulation: With patient Time For Goal Achievement: 04/12/24 Potential to Achieve Goals: Fair  Plan      Co-evaluation    PT/OT/SLP Co-Evaluation/Treatment: Yes Reason for Co-Treatment: Complexity of the patient's impairments (multi-system involvement);For patient/therapist safety;To address functional/ADL transfers   OT goals addressed during session: ADL's and self-care;Strengthening/ROM      AM-PAC OT 6 Clicks Daily Activity     Outcome Measure   Help from another person eating meals?: Total Help from another person taking care of personal grooming?: Total Help from another person toileting, which includes using toliet, bedpan, or urinal?: Total Help from another person bathing (including washing, rinsing, drying)?: Total Help from another person to put on and taking off regular upper body clothing?: Total Help from another person to put on and taking off regular lower body clothing?: Total 6 Click Score: 6    End of Session Equipment Utilized During Treatment: Oxygen   OT Visit Diagnosis: Muscle weakness (generalized) (M62.81);Other symptoms and signs involving cognitive function;Unsteadiness on feet (R26.81)   Activity Tolerance Patient limited by fatigue   Patient Left in bed;with call bell/phone within reach;with bed alarm set;with nursing/sitter in room   Nurse Communication Mobility status        Time: 8556-8541 OT Time Calculation (min): 15 min  Charges: OT General Charges $OT Visit: 1 Visit OT Evaluation $OT Re-eval: 1 Re-eval  Adrianne BROCKS, OT  Acute Rehabilitation Services Office [phone] Secure chat preferred   Adrianne GORMAN Savers 03/29/2024, 3:23 PM

## 2024-03-29 NOTE — Progress Notes (Incomplete)
 Nutrition Follow-up  DOCUMENTATION CODES:   Severe malnutrition in context of acute illness/injury  INTERVENTION:   Tube Feeding via PEG:  Vital 1.5 at 65 ml/hr Begin TF at 25 ml/hr, titrate by 10 mL q  6 hours until goal rate of 65 ml/hr Pro-Source TF20 60 mL BID TF at goal rate provides 45 g of protein, 2500 kcals, 1186 mL of free water  over 24 hours   Add Renal MVI daily   Continue Banatrol TF 60 mL QID via tube as stool bulking agent- each packet provides 5g soluble fiber  NUTRITION DIAGNOSIS:   Severe Malnutrition related to acute illness (may have chronic component due to time frame of reported wt loss) as evidenced by severe fat depletion, moderate muscle depletion.  Continues but being addressed via TF  GOAL:   Patient will meet greater than or equal to 90% of their needs  Being addressed  MONITOR:   Vent status, TF tolerance, Weight trends, I & O's, Skin, Labs  REASON FOR ASSESSMENT:   Consult Enteral/tube feeding initiation and management  ASSESSMENT:   73 yo male admitted post several days of N/V followed by sudden onset of severe epigastric pain radiating to chest and back. Pt found to have small esophageal perforation above GE junction. PMH includes CAD, chronic HFpEF, HTN, HLD, DVT, CVA, PAF, CKD 3b.  ***   NUTRITION - FOCUSED PHYSICAL EXAM:  {RD Focused Exam List:21252}  Diet Order:   Diet Order             Diet NPO time specified  Diet effective now                   EDUCATION NEEDS:   Education needs have been addressed  Skin:  Skin Assessment: Reviewed RN Assessment Skin Integrity Issues:: Stage I Stage I: n/a  Last BM:  12/8 type 7 , hx of significant diarrhea this admission  Height:   Ht Readings from Last 1 Encounters:  03/29/24 6' 1 (1.854 m)    Weight:   Wt Readings from Last 1 Encounters:  03/29/24 85.7 kg    Ideal Body Weight:  83.6 kg  BMI:  Body mass index is 24.93 kg/m.  Estimated Nutritional Needs:    Kcal:  2500-2700 kcals  Protein:  140- 165  Fluid:  1L plus UOP    ***

## 2024-03-29 NOTE — Plan of Care (Signed)
  Problem: Education: Goal: Knowledge of General Education information will improve Description: Including pain rating scale, medication(s)/side effects and non-pharmacologic comfort measures Outcome: Progressing   Problem: Health Behavior/Discharge Planning: Goal: Ability to manage health-related needs will improve Outcome: Progressing   Problem: Clinical Measurements: Goal: Will remain free from infection Outcome: Progressing Goal: Diagnostic test results will improve Outcome: Progressing Goal: Respiratory complications will improve Outcome: Progressing Goal: Cardiovascular complication will be avoided Outcome: Progressing   Problem: Activity: Goal: Risk for activity intolerance will decrease Outcome: Progressing   Problem: Nutrition: Goal: Adequate nutrition will be maintained Outcome: Progressing   Problem: Coping: Goal: Level of anxiety will decrease Outcome: Progressing   Problem: Elimination: Goal: Will not experience complications related to bowel motility Outcome: Progressing Goal: Will not experience complications related to urinary retention Outcome: Progressing   Problem: Pain Managment: Goal: General experience of comfort will improve and/or be controlled Outcome: Progressing   Problem: Safety: Goal: Ability to remain free from injury will improve Outcome: Progressing   Problem: Skin Integrity: Goal: Risk for impaired skin integrity will decrease Outcome: Progressing   Problem: Education: Goal: Ability to describe self-care measures that may prevent or decrease complications (Diabetes Survival Skills Education) will improve Outcome: Progressing   Problem: Coping: Goal: Ability to adjust to condition or change in health will improve Outcome: Progressing   Problem: Fluid Volume: Goal: Ability to maintain a balanced intake and output will improve Outcome: Progressing   Problem: Health Behavior/Discharge Planning: Goal: Ability to identify and  utilize available resources and services will improve Outcome: Progressing Goal: Ability to manage health-related needs will improve Outcome: Progressing   Problem: Metabolic: Goal: Ability to maintain appropriate glucose levels will improve Outcome: Progressing   Problem: Nutritional: Goal: Maintenance of adequate nutrition will improve Outcome: Progressing Goal: Progress toward achieving an optimal weight will improve Outcome: Progressing   Problem: Skin Integrity: Goal: Risk for impaired skin integrity will decrease Outcome: Progressing   Problem: Tissue Perfusion: Goal: Adequacy of tissue perfusion will improve Outcome: Progressing   Problem: Activity: Goal: Ability to tolerate increased activity will improve Outcome: Progressing   Problem: Respiratory: Goal: Ability to maintain a clear airway and adequate ventilation will improve Outcome: Progressing   Problem: Role Relationship: Goal: Method of communication will improve Outcome: Progressing   Problem: Education: Goal: Ability to describe self-care measures that may prevent or decrease complications (Diabetes Survival Skills Education) will improve Outcome: Progressing Goal: Individualized Educational Video(s) Outcome: Progressing   Problem: Coping: Goal: Ability to adjust to condition or change in health will improve Outcome: Progressing   Problem: Fluid Volume: Goal: Ability to maintain a balanced intake and output will improve Outcome: Progressing   Problem: Health Behavior/Discharge Planning: Goal: Ability to identify and utilize available resources and services will improve Outcome: Progressing Goal: Ability to manage health-related needs will improve Outcome: Progressing   Problem: Metabolic: Goal: Ability to maintain appropriate glucose levels will improve Outcome: Progressing   Problem: Nutritional: Goal: Maintenance of adequate nutrition will improve Outcome: Progressing Goal: Progress  toward achieving an optimal weight will improve Outcome: Progressing   Problem: Skin Integrity: Goal: Risk for impaired skin integrity will decrease Outcome: Progressing   Problem: Tissue Perfusion: Goal: Adequacy of tissue perfusion will improve Outcome: Progressing

## 2024-03-29 NOTE — Evaluation (Signed)
 Physical Therapy Re-Evaluation Patient Details Name: Billy [name] MRN: [id number] DOB: [birth year] Today's Date: 03/29/2024  History of Present Illness  Billy [name] is a 73 y.o. male who presented to [facility] ED 01/21/24 with several days of vomiting followed by an episode of severe tearing epigastric pain. Work-up revealed esophageal tear. Pt s/p esophageal stent with EGD 10/7. Attempted stent repositioning on 10/15 and was hypoxic, was intubated.  Reintubated 10/20-10/24. G tube placed 11/3. He accidentally pulled out the R chest tube on 11/7.L chest tube removed 11/11. Found to have superficial venous thrombosis of RUE 11/19. N&V with epigastirc pain 11/28, found severe duodenitis on repeat CT. S/P EGD on 12/2. 12/10 re-intubated due to respiratory distress, CRRT started. PMHx: HFrEF, AFib, CAD, PE complicated by cardiogenic shock, CKD 4, MI, peripheral neuropathy, and TIA.   Clinical Impression  Pt is currently intubated with sedation being weaned. He was able to arouse to participate in session. He is currently maintaining a forward midline gaze and seems unable to move eyes L or R when cued, even though it appears he is trying. He will rotate head to look L <> R instead. He is currently following ~90% of simple one-step cues with up to ~10 second delay. He displays generalized weakness along with deficits in activity tolerance and balance. He required minAx2 to pull up to sit from bed in egress position. He fatigued quickly and fell back to sleep after sitting up just a few minutes with bed in egress position, thus deferred EOB/OOB attempts. Updated goals and d/c recs to [facility]. Will continue to follow acutely.       If plan is discharge home, recommend the following: Assistance with cooking/housework;Direct supervision/assist for medications management;Assist for transportation;Help with stairs or ramp for entrance;Two people to help with walking and/or transfers;Two people to help with  bathing/dressing/bathroom;Assistance with feeding;Direct supervision/assist for financial management   Can travel by private vehicle   No    Equipment Recommendations Rolling walker (2 wheels);BSC/3in1;Wheelchair (measurements PT);Wheelchair cushion (measurements PT);Hospital bed;Hoyer lift (pending progress)  Recommendations for Other Services       Functional Status Assessment Patient has had a recent decline in their functional status and demonstrates the ability to make significant improvements in function in a reasonable and predictable amount of time.     Precautions / Restrictions Precautions Precautions: Fall Recall of Precautions/Restrictions: Impaired Precaution/Restrictions Comments: Intubated, G2, CRRT, rectal tube Restrictions Weight Bearing Restrictions Per Provider Order: No      Mobility  Bed Mobility Overal bed mobility: Needs Assistance Bed Mobility: Supine to Sit     Supine to sit: Min assist, +2 for physical assistance, +2 for safety/equipment, HOB elevated     General bed mobility comments: Transitioned bed to egress. Pt min +2 assist to  lift trunk from EOB with bil HHA. Deferred OOB mobility at this time due to pt's lethargy.    Transfers                   General transfer comment: Deferred d/t arousal levels    Ambulation/Gait               General Gait Details: Deferred OOB mobility at this time due to pt's lethargy.  Stairs            Wheelchair Mobility     Tilt Bed    Modified Rankin (Stroke Patients Only)       Balance Overall balance assessment: Needs assistance Sitting-balance support: No upper extremity supported, Feet unsupported  Sitting balance-Leahy Scale: Poor Sitting balance - Comments: CGA-minA to sit up with back unsupported with bed in egress position, < 1 minute duration       Standing balance comment: Deferred OOB mobility at this time due to pt's lethargy.                              Pertinent Vitals/Pain Pain Assessment Pain Assessment: Faces Faces Pain Scale: Hurts a little bit Pain Location: generalized Pain Descriptors / Indicators: Discomfort Pain Intervention(s): Limited activity within patient's tolerance, Monitored during session, Repositioned    Home Living Family/patient expects to be discharged to:: Private residence Living Arrangements: Alone Available Help at Discharge: Family;Available PRN/intermittently Type of Home: House Home Access: Ramped entrance;Stairs to enter Entrance Stairs-Rails: Right;Left Entrance Stairs-Number of Steps: 6 (front porch)   Home Layout: One level Home Equipment: Hand held shower head;Rolling Walker (2 wheels);Cane - single point Additional Comments: ex wife can assist    Prior Function Prior Level of Function : Independent/Modified Independent;Driving             Mobility Comments: Ambulates without AD. Denies fall history. ADLs Comments: Indep with ADLs/IADLs. Completes yard work lightly.     Extremity/Trunk Assessment   Upper Extremity Assessment Upper Extremity Assessment: Defer to OT evaluation    Lower Extremity Assessment Lower Extremity Assessment: Generalized weakness;RLE deficits/detail;LLE deficits/detail RLE Deficits / Details: seemed fairly symmetrical with wiggling toes, abducting/adducting hips, and flexing hips and knees up and extending them down while supine in bed; lifts leg off bed surface slightly, MMT score of 2+ hip flexion bil; nodded head yes that he could feel therapist touching his toes LLE Deficits / Details: seemed fairly symmetrical with wiggling toes, abducting/adducting hips, and flexing hips and knees up and extending them down while supine in bed; lifts leg off bed surface slightly, MMT score of 2+ hip flexion bil; nodded head yes that he could feel therapist touching his toes    Cervical / Trunk Assessment Cervical / Trunk Assessment: Normal  Communication    Communication Communication: Impaired Factors Affecting Communication: Hearing impaired;Difficulty expressing self    Cognition Arousal: Lethargic Behavior During Therapy: Flat affect   PT - Cognitive impairments: Difficult to assess Difficult to assess due to: Intubated                     PT - Cognition Comments: Pt currently intubated, but does nod head yes to communicate. Pt follows simple cues with up to ~10 sec delay with ~90% consistency. Pt trying to move eyes to therapists laterally but unable to be successfully, maintaining a midline anterior gaze. Does turn head some to look though. Fatigues quickly and returns to sleep easily Following commands: Impaired Following commands impaired: Follows one step commands with increased time, Follows one step commands inconsistently     Cueing Cueing Techniques: Verbal cues, Tactile cues     General Comments General comments (skin integrity, edema, etc.): VSS intubated with vent on 40% PEEP 5    Exercises General Exercises - Lower Extremity Ankle Circles/Pumps: AROM, Both, 10 reps, Supine Quad Sets: AAROM, Both, Supine, 5 reps Heel Slides: AAROM, Both, 5 reps, Supine Hip ABduction/ADduction: AAROM, Strengthening, Both, Other reps (comment), Supine (x3)   Assessment/Plan    PT Assessment Patient needs continued PT services  PT Problem List Decreased strength;Decreased activity tolerance;Decreased balance;Decreased mobility;Decreased knowledge of use of DME;Cardiopulmonary status limiting activity  PT Treatment Interventions DME instruction;Gait training;Stair training;Functional mobility training;Therapeutic activities;Therapeutic exercise;Balance training;Patient/family education;Neuromuscular re-education;Cognitive remediation    PT Goals (Current goals can be found in the Care Plan section)  Acute Rehab PT Goals Patient Stated Goal: unable to state at this time PT Goal Formulation: Patient unable to participate  in goal setting Time For Goal Achievement: 04/12/24 Potential to Achieve Goals: Fair    Frequency Min 2X/week     Co-evaluation   Reason for Co-Treatment: Complexity of the patient's impairments (multi-system involvement);For patient/therapist safety;To address functional/ADL transfers;Necessary to address cognition/behavior during functional activity PT goals addressed during session: Mobility/safety with mobility;Strengthening/ROM;Balance OT goals addressed during session: ADL's and self-care;Strengthening/ROM       AM-PAC PT 6 Clicks Mobility  Outcome Measure Help needed turning from your back to your side while in a flat bed without using bedrails?: Total Help needed moving from lying on your back to sitting on the side of a flat bed without using bedrails?: Total Help needed moving to and from a bed to a chair (including a wheelchair)?: Total Help needed standing up from a chair using your arms (e.g., wheelchair or bedside chair)?: Total Help needed to walk in hospital room?: Total Help needed climbing 3-5 steps with a railing? : Total 6 Click Score: 6    End of Session Equipment Utilized During Treatment: Oxygen  Activity Tolerance: Patient limited by lethargy Patient left: in bed;with call bell/phone within reach;with bed alarm set;with family/visitor present Nurse Communication: Mobility status (Rn present throughout session) PT Visit Diagnosis: Difficulty in walking, not elsewhere classified (R26.2);Unsteadiness on feet (R26.81);Other abnormalities of gait and mobility (R26.89);Muscle weakness (generalized) (M62.81);Other symptoms and signs involving the nervous system (R29.898);History of falling (Z91.81)    Time: 8555-8540 PT Time Calculation (min) (ACUTE ONLY): 15 min   Charges:   PT Evaluation $PT Re-evaluation: 1 Re-eval   PT General Charges $$ ACUTE PT VISIT: 1 Visit         Theo Ferretti, PT, DPT Acute Rehabilitation Services  Office:  [phone]   Theo CHRISTELLA Ferretti 03/29/2024, 4:45 PM

## 2024-03-30 ENCOUNTER — Inpatient Hospital Stay (HOSPITAL_COMMUNITY)

## 2024-03-30 DIAGNOSIS — K529 Noninfective gastroenteritis and colitis, unspecified: Secondary | ICD-10-CM

## 2024-03-30 DIAGNOSIS — Z86711 Personal history of pulmonary embolism: Secondary | ICD-10-CM

## 2024-03-30 DIAGNOSIS — J15212 Pneumonia due to Methicillin resistant Staphylococcus aureus: Secondary | ICD-10-CM | POA: Diagnosis not present

## 2024-03-30 DIAGNOSIS — N1832 Chronic kidney disease, stage 3b: Secondary | ICD-10-CM | POA: Diagnosis not present

## 2024-03-30 DIAGNOSIS — I480 Paroxysmal atrial fibrillation: Secondary | ICD-10-CM | POA: Diagnosis not present

## 2024-03-30 DIAGNOSIS — E785 Hyperlipidemia, unspecified: Secondary | ICD-10-CM | POA: Diagnosis not present

## 2024-03-30 DIAGNOSIS — I5032 Chronic diastolic (congestive) heart failure: Secondary | ICD-10-CM | POA: Diagnosis not present

## 2024-03-30 DIAGNOSIS — K223 Perforation of esophagus: Secondary | ICD-10-CM | POA: Diagnosis not present

## 2024-03-30 DIAGNOSIS — N179 Acute kidney failure, unspecified: Secondary | ICD-10-CM | POA: Diagnosis not present

## 2024-03-30 DIAGNOSIS — J9601 Acute respiratory failure with hypoxia: Secondary | ICD-10-CM | POA: Diagnosis not present

## 2024-03-30 DIAGNOSIS — I130 Hypertensive heart and chronic kidney disease with heart failure and stage 1 through stage 4 chronic kidney disease, or unspecified chronic kidney disease: Secondary | ICD-10-CM | POA: Diagnosis not present

## 2024-03-30 DIAGNOSIS — I5033 Acute on chronic diastolic (congestive) heart failure: Secondary | ICD-10-CM | POA: Diagnosis not present

## 2024-03-30 DIAGNOSIS — N183 Chronic kidney disease, stage 3 unspecified: Secondary | ICD-10-CM | POA: Diagnosis not present

## 2024-03-30 DIAGNOSIS — J90 Pleural effusion, not elsewhere classified: Secondary | ICD-10-CM | POA: Diagnosis not present

## 2024-03-30 LAB — GLUCOSE, CAPILLARY
Glucose-Capillary: 179 mg/dL — ABNORMAL HIGH (ref 70–99)
Glucose-Capillary: 182 mg/dL — ABNORMAL HIGH (ref 70–99)
Glucose-Capillary: 193 mg/dL — ABNORMAL HIGH (ref 70–99)
Glucose-Capillary: 197 mg/dL — ABNORMAL HIGH (ref 70–99)
Glucose-Capillary: 206 mg/dL — ABNORMAL HIGH (ref 70–99)
Glucose-Capillary: 217 mg/dL — ABNORMAL HIGH (ref 70–99)
Glucose-Capillary: 224 mg/dL — ABNORMAL HIGH (ref 70–99)

## 2024-03-30 LAB — COOXEMETRY PANEL
Carboxyhemoglobin: 1.6 % — ABNORMAL HIGH (ref 0.5–1.5)
Methemoglobin: 0.7 % (ref 0.0–1.5)
O2 Saturation: 74 %
Total hemoglobin: 9.1 g/dL — ABNORMAL LOW (ref 12.0–16.0)

## 2024-03-30 LAB — HEPARIN LEVEL (UNFRACTIONATED)
Heparin Unfractionated: <0.1 [IU]/mL — ABNORMAL LOW (ref 0.30–0.70)
Heparin Unfractionated: 0.28 [IU]/mL — ABNORMAL LOW (ref 0.30–0.70)
Heparin Unfractionated: 0.26 [IU]/mL — ABNORMAL LOW (ref 0.30–0.70)

## 2024-03-30 LAB — RENAL FUNCTION PANEL
Albumin: 1.8 g/dL — ABNORMAL LOW (ref 3.5–5.0)
Albumin: 1.8 g/dL — ABNORMAL LOW (ref 3.5–5.0)
Anion gap: 6 (ref 5–15)
Anion gap: 9 (ref 5–15)
BUN: 33 mg/dL — ABNORMAL HIGH (ref 8–23)
BUN: 39 mg/dL — ABNORMAL HIGH (ref 8–23)
CO2: 26 mmol/L (ref 22–32)
CO2: 25 mmol/L (ref 22–32)
Calcium: 7.1 mg/dL — ABNORMAL LOW (ref 8.9–10.3)
Calcium: 7.6 mg/dL — ABNORMAL LOW (ref 8.9–10.3)
Chloride: 100 mmol/L (ref 98–111)
Chloride: 102 mmol/L (ref 98–111)
Creatinine, Ser: 1.27 mg/dL — ABNORMAL HIGH (ref 0.61–1.24)
Creatinine, Ser: 1.44 mg/dL — ABNORMAL HIGH (ref 0.61–1.24)
GFR, Estimated: >60 mL/min (ref >60)
GFR, Estimated: 52 mL/min — ABNORMAL LOW (ref >60)
Glucose, Bld: 182 mg/dL — ABNORMAL HIGH (ref 70–99)
Glucose, Bld: 229 mg/dL — ABNORMAL HIGH (ref 70–99)
Phosphorus: 1.5 mg/dL — ABNORMAL LOW (ref 2.5–4.6)
Phosphorus: 3.4 mg/dL (ref 2.5–4.6)
Potassium: 4.8 mmol/L (ref 3.5–5.1)
Potassium: 4.9 mmol/L (ref 3.5–5.1)
Sodium: 132 mmol/L — ABNORMAL LOW (ref 135–145)
Sodium: 136 mmol/L (ref 135–145)

## 2024-03-30 LAB — APTT: aPTT: 68 s — ABNORMAL HIGH (ref 24–36)

## 2024-03-30 LAB — CBC
HCT: 27.4 % — ABNORMAL LOW (ref 39.0–52.0)
Hemoglobin: 9.7 g/dL — ABNORMAL LOW (ref 13.0–17.0)
MCH: 30.5 pg (ref 26.0–34.0)
MCHC: 35.4 g/dL (ref 30.0–36.0)
MCV: 86.2 fL (ref 80.0–100.0)
Platelets: 301 K/uL (ref 150–400)
RBC: 3.18 MIL/uL — ABNORMAL LOW (ref 4.22–5.81)
RDW: 16.4 % — ABNORMAL HIGH (ref 11.5–15.5)
WBC: 21.2 K/uL — ABNORMAL HIGH (ref 4.0–10.5)
nRBC: 0.1 % (ref 0.0–0.2)

## 2024-03-30 LAB — TYPE AND SCREEN
ABO/RH(D): A POS
Antibody Screen: NEGATIVE
Unit division: 0

## 2024-03-30 LAB — BPAM RBC
Blood Product Expiration Date: 202512282359
ISSUE DATE / TIME: 202512121202
Unit Type and Rh: 6200

## 2024-03-30 LAB — MAGNESIUM: Magnesium: 2.3 mg/dL (ref 1.7–2.4)

## 2024-03-30 MED ORDER — HEPARIN SODIUM (PORCINE) 1000 UNIT/ML DIALYSIS
1000.0000 [IU] | INTRAMUSCULAR | Status: DC | PRN
  Administered 2024-04-02: 13:00:00 1200 [IU] via INTRAVENOUS_CENTRAL

## 2024-03-30 MED ORDER — HYDROMORPHONE HCL 1 MG/ML IJ SOLN
0.5000 mg | Freq: Once | INTRAMUSCULAR | Status: AC
  Administered 2024-03-30: 0.5 mg via INTRAVENOUS
  Filled 2024-03-30: qty 0.5

## 2024-03-30 MED ORDER — INSULIN GLARGINE 100 UNIT/ML ~~LOC~~ SOLN
5.0000 [IU] | Freq: Every day | SUBCUTANEOUS | Status: DC
  Administered 2024-03-30 – 2024-03-31 (×2): 5 [IU] via SUBCUTANEOUS
  Filled 2024-03-30 (×2): qty 0.05

## 2024-03-30 MED ORDER — HYDROMORPHONE HCL 1 MG/ML IJ SOLN
0.5000 mg | INTRAMUSCULAR | Status: DC | PRN
  Administered 2024-03-30 – 2024-04-02 (×17): 0.5 mg via INTRAVENOUS
  Filled 2024-03-30 (×18): qty 0.5

## 2024-03-30 MED ORDER — SODIUM PHOSPHATES 45 MMOLE/15ML IV SOLN
45.0000 mmol | Freq: Once | INTRAVENOUS | Status: AC
  Administered 2024-03-30: 45 mmol via INTRAVENOUS
  Filled 2024-03-30: qty 15

## 2024-03-30 MED ORDER — ORAL CARE MOUTH RINSE: 15.0000 mL | OROMUCOSAL | Status: DC | PRN

## 2024-03-30 MED ORDER — SUCRALFATE 1 GM/10ML PO SUSP
1.0000 g | Freq: Four times a day (QID) | ORAL | Status: DC
  Administered 2024-03-30 – 2024-03-31 (×3): 1 g via ORAL
  Filled 2024-03-30 (×4): qty 10

## 2024-03-30 MED ORDER — OXYCODONE HCL 5 MG PO TABS: 10.0000 mg | ORAL_TABLET | Freq: Four times a day (QID) | ORAL | Status: DC | PRN

## 2024-03-30 MED ORDER — HYDROXYZINE HCL 25 MG PO TABS
25.0000 mg | ORAL_TABLET | Freq: Three times a day (TID) | ORAL | Status: DC | PRN
  Administered 2024-03-30 – 2024-04-01 (×8): 25 mg via ORAL
  Filled 2024-03-30 (×8): qty 1

## 2024-03-30 MED ORDER — OXYCODONE HCL 5 MG PO TABS
10.0000 mg | ORAL_TABLET | ORAL | Status: DC | PRN
  Administered 2024-03-30: 10 mg via ORAL
  Filled 2024-03-30: qty 2

## 2024-03-30 MED ORDER — HYDROMORPHONE HCL 1 MG/ML IJ SOLN: 0.5000 mg | INTRAMUSCULAR | Status: DC | PRN

## 2024-03-30 MED ORDER — OXYCODONE HCL 5 MG PO TABS
5.0000 mg | ORAL_TABLET | ORAL | Status: DC | PRN
  Administered 2024-03-30 (×2): 5 mg via ORAL
  Filled 2024-03-30 (×2): qty 1

## 2024-03-30 MED ORDER — OXYCODONE HCL 5 MG PO TABS
10.0000 mg | ORAL_TABLET | ORAL | Status: DC | PRN
  Administered 2024-03-31 – 2024-04-02 (×3): 10 mg via ORAL
  Filled 2024-03-30 (×3): qty 2

## 2024-03-30 MED ORDER — HYDRALAZINE HCL 25 MG PO TABS
25.0000 mg | ORAL_TABLET | Freq: Three times a day (TID) | ORAL | Status: DC
  Administered 2024-03-30: 25 mg via ORAL
  Filled 2024-03-30: qty 1

## 2024-03-30 MED ADMIN — Famotidine in NaCl 0.9% IV Soln 20 MG/50ML: 20 mg | INTRAVENOUS | NDC 00338519741
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
## 2024-03-30 NOTE — Plan of Care (Signed)
  Problem: Education: Goal: Knowledge of General Education information will improve Description: Including pain rating scale, medication(s)/side effects and non-pharmacologic comfort measures Outcome: Progressing   Problem: Health Behavior/Discharge Planning: Goal: Ability to manage health-related needs will improve Outcome: Progressing   Problem: Clinical Measurements: Goal: Will remain free from infection Outcome: Progressing Goal: Diagnostic test results will improve Outcome: Progressing Goal: Respiratory complications will improve Outcome: Progressing Goal: Cardiovascular complication will be avoided Outcome: Progressing   Problem: Activity: Goal: Risk for activity intolerance will decrease Outcome: Progressing   Problem: Nutrition: Goal: Adequate nutrition will be maintained Outcome: Progressing   Problem: Coping: Goal: Level of anxiety will decrease Outcome: Progressing   Problem: Elimination: Goal: Will not experience complications related to bowel motility Outcome: Progressing Goal: Will not experience complications related to urinary retention Outcome: Progressing   Problem: Pain Managment: Goal: General experience of comfort will improve and/or be controlled Outcome: Progressing   Problem: Safety: Goal: Ability to remain free from injury will improve Outcome: Progressing   Problem: Skin Integrity: Goal: Risk for impaired skin integrity will decrease Outcome: Progressing   Problem: Education: Goal: Ability to describe self-care measures that may prevent or decrease complications (Diabetes Survival Skills Education) will improve Outcome: Progressing   Problem: Coping: Goal: Ability to adjust to condition or change in health will improve Outcome: Progressing   Problem: Fluid Volume: Goal: Ability to maintain a balanced intake and output will improve Outcome: Progressing   Problem: Health Behavior/Discharge Planning: Goal: Ability to identify and  utilize available resources and services will improve Outcome: Progressing Goal: Ability to manage health-related needs will improve Outcome: Progressing   Problem: Metabolic: Goal: Ability to maintain appropriate glucose levels will improve Outcome: Progressing   Problem: Nutritional: Goal: Maintenance of adequate nutrition will improve Outcome: Progressing Goal: Progress toward achieving an optimal weight will improve Outcome: Progressing   Problem: Skin Integrity: Goal: Risk for impaired skin integrity will decrease Outcome: Progressing   Problem: Tissue Perfusion: Goal: Adequacy of tissue perfusion will improve Outcome: Progressing   Problem: Activity: Goal: Ability to tolerate increased activity will improve Outcome: Progressing   Problem: Respiratory: Goal: Ability to maintain a clear airway and adequate ventilation will improve Outcome: Progressing   Problem: Role Relationship: Goal: Method of communication will improve Outcome: Progressing   Problem: Education: Goal: Ability to describe self-care measures that may prevent or decrease complications (Diabetes Survival Skills Education) will improve Outcome: Progressing Goal: Individualized Educational Video(s) Outcome: Progressing   Problem: Coping: Goal: Ability to adjust to condition or change in health will improve Outcome: Progressing   Problem: Fluid Volume: Goal: Ability to maintain a balanced intake and output will improve Outcome: Progressing   Problem: Health Behavior/Discharge Planning: Goal: Ability to identify and utilize available resources and services will improve Outcome: Progressing Goal: Ability to manage health-related needs will improve Outcome: Progressing   Problem: Metabolic: Goal: Ability to maintain appropriate glucose levels will improve Outcome: Progressing   Problem: Nutritional: Goal: Maintenance of adequate nutrition will improve Outcome: Progressing Goal: Progress  toward achieving an optimal weight will improve Outcome: Progressing   Problem: Skin Integrity: Goal: Risk for impaired skin integrity will decrease Outcome: Progressing   Problem: Tissue Perfusion: Goal: Adequacy of tissue perfusion will improve Outcome: Progressing

## 2024-03-30 NOTE — Progress Notes (Addendum)
 Patient ID: Billy [name], male   DOB: April 18, 1951, 73 y.o.   MRN: [id number]     Advanced Heart Failure Rounding Note  Cardiologist: Dorn Lesches, MD  Chief Complaint: S/p esophageal stent. AHF reconsulted 12/5 for A/C HFpEF.  Subjective:   - 10/6: esoph stent placement - 10/15: S/p EGD with stent repositioning by Dr. Shyrl  - 10/23: Ltd echo EF 60-65%  - 10/24: Barium swallow showed persistent leak  - 11/3: PEG tube placed - 11/11: L CT removed - 11/19: SVT in cephalic vein - 11/26: Stent removed. Esophagram (-) for esophageal leak - 11/28: Duodenitis/Enteritis. GI consulted. + bloody emesis.  - 12/2: EDG w/ ischemic ulcers. Carafate  increased + BID PPI - 12/4: PCCM consulted for increased WOB and progressive hypoxia.  - 12/5: AHF re consulted for A/C HFpEF - 12/10: Transferred back to ICU with acute respiratory distress, reintubated. Concern for drug-induced lupus 2/2 hydralazine . CRRT started.  Echo showed EF 60-65% with basal inferior akinesis, moderate LVH, normal RV size and mildly decreased systolic function.   Has had long, complicated hospitalization. Day: 69  Off pressors and now on clevidipine  gtt 7 to manage BP.  CVP 5, CVVH running net negative 50 cc/hr UF yesterday.    He is in NSR, on amiodarone  po and heparin  gtt.   He is now on Solumedrol 60 mg IV daily.   Extubated, awake/alert.  Remains on linezolid  for MRSA in sputum from 12/10.    Objective:    Weight Range: 82.3 kg Body mass index is 23.94 kg/m.   Vital Signs:   Temp:  [98.1 F (36.7 C)-98.8 F (37.1 C)] 98.8 F (37.1 C) (12/13 0905) Pulse Rate:  [65-89] 74 (12/13 0905) Resp:  [10-35] 22 (12/13 0905) SpO2:  [91 %-100 %] 94 % (12/13 0905) Arterial Line BP: (125-193)/(47-84) 149/49 (12/13 0905) FiO2 (%):  [40 %] 40 % (12/12 1600) Weight:  [82.3 kg] 82.3 kg (12/13 0600) Last BM Date : 03/29/24  Weight change: Filed Weights   03/28/24 0600 03/29/24 0443 03/30/24 0600  Weight: 86.8 kg 85.7 kg  82.3 kg   Intake/Output:  Intake/Output Summary (Last 24 hours) at 03/30/2024 0919 Last data filed at 03/30/2024 0900 Gross per 24 hour  Intake 3997.79 ml  Output 6056.7 ml  Net -2058.91 ml    Physical Exam   General: NAD Neck: No JVD, no thyromegaly or thyroid  nodule.  Lungs: Clear to auscultation bilaterally with normal respiratory effort. CV: Nondisplaced PMI.  Heart regular S1/S2, no S3/S4, no murmur.  1+ edema to thighs.   Abdomen: Soft, nontender, no hepatosplenomegaly, no distention.  Skin: Petechial rash on arms/legs, improving  Neurologic: Alert and oriented x 3.  Psych: Normal affect. Extremities: No clubbing or cyanosis.  HEENT: Normal.   Telemetry   NSR 70s (Personally reviewed)    Labs    CBC Recent Labs    03/29/24 0415 03/29/24 1545 03/30/24 0420  WBC 22.4*  --  21.2*  HGB 7.3* 8.8* 9.7*  HCT 24.1* 28.7* 27.4*  MCV 88.0  --  86.2  PLT 305  --  301   Basic Metabolic Panel Recent Labs    87/87/74 1544 03/29/24 1952 03/30/24 0420  NA 133*  --  132*  K 5.3*  --  4.8  CL 100  --  100  CO2 27  --  26  GLUCOSE 223*  --  182*  BUN 39*  --  33*  CREATININE 1.63*  --  1.27*  CALCIUM  7.3*  --  7.1*  MG  --  2.3 2.3  PHOS 3.2  --  1.5*   Liver Function Tests Recent Labs    03/29/24 1544 03/30/24 0420  ALBUMIN  1.9* 1.8*   BNP (last 3 results) Recent Labs    06/27/23 1116 03/22/24 0913 03/25/24 0535  BNP 646.4* 2,404.1* 831.9*   Hemoglobin A1C No results for input(s): HGBA1C in the last 72 hours.  Fasting Lipid Panel Recent Labs    03/28/24 0347  TRIG 72    Medications:    Scheduled Medications:  amiodarone   200 mg Per Tube Daily   atorvastatin   80 mg Per Tube Daily   Chlorhexidine  Gluconate Cloth  6 each Topical Daily   ezetimibe   10 mg Per Tube Daily   feeding supplement (PROSource TF20)  60 mL Per Tube BID   [name] butt cream   Topical BID   guaiFENesin   10 mL Per Tube Q4H   insulin  aspart  0-9 Units Subcutaneous  Q4H   lidocaine   1 patch Transdermal Q24H   methylPREDNISolone  (SOLU-MEDROL ) injection  60 mg Intravenous Daily   multivitamin  1 tablet Per Tube QHS   mupirocin  ointment  1 Application Nasal BID   pantoprazole  (PROTONIX ) IV  40 mg Intravenous Q12H   saccharomyces boulardii  250 mg Per Tube BID   sodium chloride  flush  10-40 mL Intracatheter Q12H    Infusions:  clevidipine  7 mg/hr (03/30/24 0900)   famotidine  (PEPCID ) IV Stopped (03/29/24 2050)   feeding supplement (VITAL 1.5 CAL) 65 mL/hr at 03/30/24 0900   heparin  1,600 Units/hr (03/30/24 0900)   linezolid  (ZYVOX ) IV Stopped (03/29/24 2232)   PrismaSol  BGK 2/3.5 500 mL/hr at 03/29/24 2027   PrismaSol  BGK 2/3.5 400 mL/hr at 03/30/24 9346   prismasol  BGK 4/2.5 1,500 mL/hr at 03/30/24 9143   promethazine  (PHENERGAN ) injection (IM or IVPB) Stopped (03/18/24 0911)   sodium PHOSPHATE  IVPB (in mmol) 44 mL/hr at 03/30/24 0900    PRN Medications: acetaminophen  (TYLENOL ) oral liquid 160 mg/5 mL, alum & mag hydroxide-simeth, diclofenac  Sodium, [COMPLETED] diphenoxylate -atropine  **FOLLOWED BY** diphenoxylate -atropine , docusate, glucagon  (human recombinant), guaiFENesin , heparin , heparin , heparin , lip balm, mouth rinse, mouth rinse, oxyCODONE , polyethylene glycol, promethazine  (PHENERGAN ) injection (IM or IVPB), simethicone , sodium chloride , sodium chloride  flush, traZODone   Patient Profile  Patient with longstanding history of chronic heart failure with preserved ejection fraction, RV failure with history of pulmonary embolism presents with esophageal perforation.  S/P Stent Esophageal Stent. Stent repositioned 10/15. AHF signed off 11/10. Re-consulted 03/22/24 with A/C HFpEF.  Assessment/Plan  1.  Esophageal Perforation - Boerhaave syndrome.  - CT C/A/P: with pneumomediastinum and large mass-like density in lower esophagus - 10/6: EGD with esophageal stent placed  - 10/15: Stent migrated w/ leak > back to OR for repositioning  - 10/24:  barium swallow w persistent leak  - 11/3: PEG placed - on TF and meds by PEG.  - 11/26: Stent removed. Esophagram (-) for esophageal leak - 12/2: EDG w/ ischemic ulcers. Carafate  increased + BID PPI - Extubated yesterday, still NPO.   2. Acute on chronic HFpEF - h/o cardiogenic shock with RV failure in the setting of cardiac arrest thought to be caused by acute PE vs ACS in 3/25. Echo at the time showed EF 55%, severe, LVH, G2DD, and severely reduced RV function with strain. There was concern for cardiac sarcoid amyloid, however CMR LGE consistent with ischemic disease. RV on echo 9/25 with complete recovery, EF 60-65%, nl RV function.    - RHC 3/25: Mild  PAH with severe RV failure though CO is preserved.   - Echo 10/25 EF 60-65%, RV normal.  - Echo 12/10: EF 60-65% with basal inferior akinesis, moderate LVH, normal RV size and mildly decreased systolic function.  - Off pressors and now on clevidipine  for HTN.   - Has significant peripheral edema though CVP only 5.  Renal has increased CVVH rate to UF net negative 100 cc/hr today.   3. CAD:  - LHC 3/25: 3v CAD with CTO RCA with L->R collaterals and moderate non-obstructive CAD in L system. Medical management.  - suspect CP related to esophageal issues and not CAD - No chest pain.     4.  AKI on CKD Stage 3b: Baseline sCr ~ 2.1. - Worsening AKI> CRRT - L RAS. Continue CRRT  - UF net negative 100 cc/hr today per renal with significant peripheral edema/3rd-spacing.   5. PAF:   - s/p TEE/DCCV 3/25 - has converted to AF a couple times this admit - In NSR on po amiodarone .  - Continue heparin  gtt.   6.  H/o DVT/PE:  Bilateral upper and lower DVTs, mixed acute and chronic.  - s/p infrarenal IVC placement 3/25 - V/Q 3/25 with multiple perfusion defects - on Heparin  drip.    7. Carotid stenosis: h/o CVA. TCAR in 6/24. Okay to remain off plavix  at this point. - carotid US  9/25: widely patent carotid stent on R and no stenosis on L   8.  Acute Hypoxic Respiratory Failure/PNA/ Exudative Lt Pleural Effusion  - CT placed for pleural effusion. Resp Cx w/ rare yeast, BCx NGTD  - Re-intubated 12/10 per CCM.  - Extubated 12/12.   9. Severe protein calorie malnutrition - Nutrition following. PEG tube + TF  10. HTN - Clevidipine  IV - Can start amlodipine  tomorrow if BP still elevated.   11. Superficial LUE thrombus - Continue PICC - heparin  gtt  12. Petechial Rash - With diffuse petechial rash, ANA+, anti-histone ab+, and low C4, there is concern for possible drug-induced lupus from hydralazine  with AKI.  ANCA negative.  - Discussed with nephrology and CCM, would ideally get a renal biopsy.  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 Would like to get eventually.  - He is now on empiric Solumedrol.  Not ideal with friable esophagus but tolerating so far.   13. Anemia Hgb 9.7, had transfusion 12/12.   CRITICAL CARE Performed by: Ezra Shuck  Total critical care time: 35 minutes  Critical care time was exclusive of separately billable procedures and treating other patients.  Critical care was necessary to treat or prevent imminent or life-threatening deterioration.  Critical care was time spent personally by me on the following activities: development of treatment plan with patient and/or surrogate as well as nursing, discussions with consultants, evaluation of patient's response to treatment, examination of patient, obtaining history from patient or surrogate, ordering and performing treatments and interventions, ordering and review of laboratory studies, ordering and review of radiographic studies, pulse oximetry and re-evaluation of patient's condition.  Ezra Shuck 03/30/2024 9:19 AM

## 2024-03-30 NOTE — Progress Notes (Signed)
 Physical Therapy Treatment Patient Details Name: Billy [name] MRN: [id number] DOB: [birth year] Today's Date: 03/30/2024   History of Present Illness Billy [name] is a 73 y.o. male who presented to [facility] ED 01/21/24 with several days of vomiting followed by an episode of severe tearing epigastric pain. Work-up revealed esophageal tear. Pt s/p esophageal stent with EGD 10/7. Attempted stent repositioning on 10/15 and was hypoxic, was intubated.  Reintubated 10/20-10/24. G tube placed 11/3. He accidentally pulled out the R chest tube on 11/7.L chest tube removed 11/11. Found to have superficial venous thrombosis of RUE 11/19. N&V with epigastirc pain 11/28, found severe duodenitis on repeat CT. S/P EGD on 12/2. 12/10 re-intubated due to respiratory distress, CRRT started. Extubated 12/12. PMHx: HFrEF, AFib, CAD, PE complicated by cardiogenic shock, CKD 4, MI, peripheral neuropathy, and TIA.    PT Comments  Limited session to sitting pt EOB and performing AROM exercises of his arms and legs there and deferred OOB mobility this date per RN request considering how difficult it was to control pt's pain and anxiety earlier. The pt only needed modA to transition supine <> sit EOB and intermittent minA for dynamic sitting balance. He performed many reps of each exercise at the EOB, needing cues to pause and rest due to pt evidently being SOB. Pt would often require cues to rest as he would often try to continue exercises with little awareness of his SOB. He continues to have delayed processing and initiation. Will continue to follow acutely.     If plan is discharge home, recommend the following: Assistance with cooking/housework;Direct supervision/assist for medications management;Assist for transportation;Help with stairs or ramp for entrance;Two people to help with walking and/or transfers;Two people to help with bathing/dressing/bathroom;Assistance with feeding;Direct supervision/assist for financial management   Can  travel by private vehicle     No  Equipment Recommendations  Rolling walker (2 wheels);BSC/3in1;Wheelchair (measurements PT);Wheelchair cushion (measurements PT);Hospital bed;Hoyer lift (pending progress)    Recommendations for Other Services       Precautions / Restrictions Precautions Precautions: Fall Recall of Precautions/Restrictions: Impaired Precaution/Restrictions Comments: PEG, CRRT, A-line Restrictions Weight Bearing Restrictions Per Provider Order: No     Mobility  Bed Mobility Overal bed mobility: Needs Assistance Bed Mobility: Supine to Sit, Sit to Supine     Supine to sit: +2 for safety/equipment, HOB elevated, Mod assist Sit to supine: Mod assist, +2 for safety/equipment, HOB elevated   General bed mobility comments: Cued pt to bring bil legs off R EOB and grab therapist's arm to pull trunk up to sit, modA needed to manage trunk and legs. ModA to lift legs and direct trunk back to supine with pt initiating lateral lean to lay down. +2 from RN to manage lines    Transfers                   General transfer comment: deferred per RN request due to difficulty controlling pain and anxiety earlier today    Ambulation/Gait               General Gait Details: deferred per RN request due to difficulty controlling pain and anxiety earlier today   Stairs             Wheelchair Mobility     Tilt Bed    Modified Rankin (Stroke Patients Only)       Balance Overall balance assessment: Needs assistance Sitting-balance support: No upper extremity supported, Feet supported Sitting balance-Leahy Scale: Fair Sitting balance - Comments:  Static sitting EOB with kyphotic posture and CGA for safety. Intermittent need for minA to maintain balance due to posterior lean with dynamic sitting       Standing balance comment: deferred                            Communication Communication Communication: Impaired Factors Affecting  Communication: Hearing impaired;Difficulty expressing self  Cognition Arousal: Alert Behavior During Therapy: Flat affect   PT - Cognitive impairments: Attention, Sequencing, Problem solving, Initiation, Awareness                       PT - Cognition Comments: Pt follows simple cues consistently with mild delay (~5 seconds), intermittently needing cues repeated. Delayed processing. Needs cues to sequence mobility and to rest for safety as pt kept trying to do exercises when evidently SOB. Following commands: Impaired Following commands impaired: Follows one step commands with increased time, Follows multi-step commands inconsistently, Follows multi-step commands with increased time    Cueing Cueing Techniques: Verbal cues, Tactile cues  Exercises General Exercises - Upper Extremity Shoulder ABduction: AROM, Both, 20 reps, Seated Elbow Flexion: AROM, Both, 10 reps, Seated Elbow Extension: AROM, Both, 10 reps, Seated Digit Composite Flexion: AROM, Both, 10 reps, Seated Composite Extension: AROM, Both, 10 reps, Seated General Exercises - Lower Extremity Ankle Circles/Pumps: AROM, Both, 15 reps, Seated Long Arc Quad: AROM, Both, 20 reps, Seated (>20 reps bil) Hip ABduction/ADduction: Both, AROM, 20 reps, Seated (>20 reps) Hip Flexion/Marching: AROM, Both, 20 reps, Seated (>20 reps bil)    General Comments        Pertinent Vitals/Pain Pain Assessment Pain Assessment: Faces Faces Pain Scale: Hurts little more Pain Location: abdomen Pain Descriptors / Indicators: Discomfort, Guarding Pain Intervention(s): Monitored during session, Limited activity within patient's tolerance, Repositioned, Heat applied (RN applied heat)    Home Living                          Prior Function            PT Goals (current goals can now be found in the care plan section) Acute Rehab PT Goals Patient Stated Goal: to improve PT Goal Formulation: With patient/family Time For Goal  Achievement: 04/12/24 Potential to Achieve Goals: Fair Progress towards PT goals: Progressing toward goals    Frequency    Min 2X/week      PT Plan      Co-evaluation              AM-PAC PT 6 Clicks Mobility   Outcome Measure  Help needed turning from your back to your side while in a flat bed without using bedrails?: A Lot Help needed moving from lying on your back to sitting on the side of a flat bed without using bedrails?: A Lot Help needed moving to and from a bed to a chair (including a wheelchair)?: Total Help needed standing up from a chair using your arms (e.g., wheelchair or bedside chair)?: Total Help needed to walk in hospital room?: Total Help needed climbing 3-5 steps with a railing? : Total 6 Click Score: 8    End of Session   Activity Tolerance: Patient tolerated treatment well Patient left: in bed;with call bell/phone within reach;with bed alarm set;with family/visitor present;with nursing/sitter in room Nurse Communication: Mobility status (RN present during session) PT Visit Diagnosis: Difficulty in walking, not elsewhere classified (R26.2);Unsteadiness on  feet (R26.81);Other abnormalities of gait and mobility (R26.89);Muscle weakness (generalized) (M62.81);Other symptoms and signs involving the nervous system (R29.898);History of falling (Z91.81)     Time: 8385-8367 PT Time Calculation (min) (ACUTE ONLY): 18 min  Charges:    $Therapeutic Exercise: 8-22 mins PT General Charges $$ ACUTE PT VISIT: 1 Visit                     Theo Ferretti, PT, DPT Acute Rehabilitation Services  Office: [phone]    Theo CHRISTELLA Ferretti 03/30/2024, 5:17 PM

## 2024-03-30 NOTE — Progress Notes (Signed)
 [facility] Progress Note   Assessment/ Plan:    AKI on CKD 3b             - HTN, backdrop of pretty bad L-sided RAS (occlusive)--> so essentially he has 1 functioning kidney and is more susceptible to volume status/ nephrotoxic agents             - off Losartan  and torsemide              - on CRRT- all 4K, no heparin  for now-- increase fluid removal, hoepfully can come off cleviprex  when fluid better  - ANA +, C4 low, + hematuria, anti-histone Ab +- will need a biopsy at some point- appreciate IR  - sig inflamed mucosa and ulcers on EGD 03/19/24--> review by PCCM suggest ischemic ulcers  - off unasyn  and hydralazine   - started solumedrol 03/27/24   2.  Petechial rash;             - appears like a drug rash             - stop unasyn , I stopped hydralazine  too   3.  Acute on chronic CHF exacerbation;             - on CRRT   4.  H/o DVT and PE:             - had RH strain previously             - Eliquis  on hold for now- per primary  - hep gtt   5.  Boorehave's syndrome             - s/p repair             - on carafate -- can resume now             - Has a PEG  - EGD 03/19/24 with ulcers  - PPI and pepcid    6.  Terminal Ileitis/ Duodenitis             - seen on CT scan 03/24/24   7.  Dispo: inpt  Subjective:    Continues on CRRT.  Extubated..  Hypertensive,  Reports pain around PEG- CXR and AXR done   Objective:   BP (!) 148/48   Pulse 74   Temp 98.8 F (37.1 C)   Resp (!) 22   Ht 6' 1 (1.854 m)   Wt 82.3 kg   SpO2 94%   BMI 23.94 kg/m   Intake/Output Summary (Last 24 hours) at 03/30/2024 1018 Last data filed at 03/30/2024 1005 Gross per 24 hour  Intake 4034.24 ml  Output 5928.7 ml  Net -1894.46 ml   Weight change: -3.4 kg  Physical Exam: GEN awake HEENT EOMI EPRRL NECK no JVD PULM wet and coarse bilaterally CVRRR ABD + PEG, slightly distended EXT 3+ anasarca, UNNA boots SKIN: petechial nonblanching rash thighs, palms, arms NEURO  +  intubated and sedated  Imaging: DG CHEST PORT 1 VIEW Result Date: 03/29/2024 CLINICAL DATA:  ARDS EXAM: PORTABLE CHEST 1 VIEW COMPARISON:  Yesterday FINDINGS: Stable cardiomediastinal silhouette. Endotracheal tube is unchanged. Right-sided PICC line and right internal jugular catheter unchanged. Stable mild patchy and interstitial densities are noted concerning for edema or inflammation with stable bilateral pleural effusions, right greater than left. IMPRESSION: Stable support apparatus. Stable bilateral lung opacities as described above. Electronically Signed   By: Lynwood Landy Raddle M.D.   On: 03/29/2024 13:16   DG Chest Port 1  View Result Date: 03/28/2024 CLINICAL DATA:  Acute respiratory failure. EXAM: PORTABLE CHEST 1 VIEW COMPARISON:  03/27/2024 FINDINGS: Right IJ central venous catheter with tip over the SVC. Unchanged and in adequate position. Right-sided PICC line with tip over the SVC. Loop recorder over the left chest unchanged. Lungs are adequately inflated demonstrate interval improvement and bilateral patchy airspace process likely multifocal pneumonia. Stable small bilateral pleural effusions likely with associated basilar atelectasis. Cardiomediastinal silhouette and remainder of the exam is unchanged. IMPRESSION: 1. Interval improvement and bilateral patchy airspace process likely multifocal pneumonia. Stable small bilateral pleural effusions likely with associated basilar atelectasis. 2. Tubes and lines as described. Electronically Signed   By: Toribio Agreste M.D.   On: 03/28/2024 13:38    Labs: BMET Recent Labs  Lab 03/27/24 0500 03/27/24 1152 03/27/24 1535 03/28/24 0347 03/28/24 1336 03/28/24 1448 03/28/24 1556 03/29/24 0415 03/29/24 1544 03/30/24 0420  NA 136   < > 134* 138 135 135 137 134* 133* 132*  K 4.9   < > 5.1 5.0 4.8 5.0 5.2* 5.1 5.3* 4.8  CL 103  --  102 107  --   --  102 102 100 100  CO2 26  --  22 22  --   --  27 25 27 26   GLUCOSE 122*  --  157* 195*  --    --  211* 164* 223* 182*  BUN 101*  --  89* 61*  --   --  51* 41* 39* 33*  CREATININE 4.30*  --  3.60* 2.69*  --   --  2.17* 1.78* 1.63* 1.27*  CALCIUM  7.7*  --  7.5* 7.7*  --   --  7.4* 7.0* 7.3* 7.1*  PHOS 3.7  --  2.8 2.7  --   --  2.8 2.8 3.2 1.5*   < > = values in this interval not displayed.   CBC Recent Labs  Lab 03/25/24 0535 03/26/24 0809 03/27/24 0500 03/27/24 1152 03/28/24 0347 03/28/24 1336 03/28/24 1559 03/29/24 0415 03/29/24 1545 03/30/24 0420  WBC 15.6* 17.2* 18.0*  --  20.1*  --   --  22.4*  --  21.2*  NEUTROABS 13.5* 15.2* 16.1*  --   --   --   --   --   --   --   HGB 9.5* 8.6* 8.0*   < > 7.6*   < > 7.8* 7.3* 8.8* 9.7*  HCT 30.2* 27.7* 25.5*   < > 23.7*   < > 24.8* 24.1* 28.7* 27.4*  MCV 86.3 86.6 87.9  --  84.9  --   --  88.0  --  86.2  PLT 224 226 229  --  290  --   --  305  --  301   < > = values in this interval not displayed.    Medications:     amiodarone   200 mg Per Tube Daily   atorvastatin   80 mg Per Tube Daily   Chlorhexidine  Gluconate Cloth  6 each Topical Daily   ezetimibe   10 mg Per Tube Daily   feeding supplement (PROSource TF20)  60 mL Per Tube BID   [name] butt cream   Topical BID   guaiFENesin   10 mL Per Tube Q4H   hydrALAZINE   25 mg Oral Q8H   insulin  aspart  0-9 Units Subcutaneous Q4H   lidocaine   1 patch Transdermal Q24H   methylPREDNISolone  (SOLU-MEDROL ) injection  60 mg Intravenous Daily   multivitamin  1 tablet Per Tube QHS  mupirocin  ointment  1 Application Nasal BID   pantoprazole  (PROTONIX ) IV  40 mg Intravenous Q12H   saccharomyces boulardii  250 mg Per Tube BID   sodium chloride  flush  10-40 mL Intracatheter Q12H    Almarie Bonine MD 03/30/2024, 10:18 AM

## 2024-03-30 NOTE — Progress Notes (Signed)
 eLink Physician-Brief Progress Note Patient Name: [name] DOB: 23-Oct-1950 MRN: [id number]   Date of Service  03/30/2024  HPI/Events of Note  Patient with reproducible epigastric pain to palpation, no guarding or rebound, abdomen is soft.  eICU Interventions  PRN Oxycodone  ordered.        Joas Motton U Tattianna Schnarr 03/30/2024, 3:27 AM
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
## 2024-03-31 DIAGNOSIS — N183 Chronic kidney disease, stage 3 unspecified: Secondary | ICD-10-CM | POA: Diagnosis not present

## 2024-03-31 DIAGNOSIS — K223 Perforation of esophagus: Secondary | ICD-10-CM | POA: Diagnosis not present

## 2024-03-31 DIAGNOSIS — J15212 Pneumonia due to Methicillin resistant Staphylococcus aureus: Secondary | ICD-10-CM | POA: Diagnosis not present

## 2024-03-31 DIAGNOSIS — N179 Acute kidney failure, unspecified: Secondary | ICD-10-CM | POA: Diagnosis not present

## 2024-03-31 DIAGNOSIS — I480 Paroxysmal atrial fibrillation: Secondary | ICD-10-CM | POA: Diagnosis not present

## 2024-03-31 DIAGNOSIS — J90 Pleural effusion, not elsewhere classified: Secondary | ICD-10-CM | POA: Diagnosis not present

## 2024-03-31 DIAGNOSIS — I5033 Acute on chronic diastolic (congestive) heart failure: Secondary | ICD-10-CM | POA: Diagnosis not present

## 2024-03-31 DIAGNOSIS — J9601 Acute respiratory failure with hypoxia: Secondary | ICD-10-CM | POA: Diagnosis not present

## 2024-03-31 LAB — CBC
HCT: 34.6 % — ABNORMAL LOW (ref 39.0–52.0)
Hemoglobin: 11.3 g/dL — ABNORMAL LOW (ref 13.0–17.0)
MCH: 27.4 pg (ref 26.0–34.0)
MCHC: 32.7 g/dL (ref 30.0–36.0)
MCV: 84 fL (ref 80.0–100.0)
Platelets: 337 K/uL (ref 150–400)
RBC: 4.12 MIL/uL — ABNORMAL LOW (ref 4.22–5.81)
RDW: 16.3 % — ABNORMAL HIGH (ref 11.5–15.5)
WBC: 26.2 K/uL — ABNORMAL HIGH (ref 4.0–10.5)
nRBC: 0.1 % (ref 0.0–0.2)

## 2024-03-31 LAB — RENAL FUNCTION PANEL
Albumin: 2 g/dL — ABNORMAL LOW (ref 3.5–5.0)
Albumin: 1.8 g/dL — ABNORMAL LOW (ref 3.5–5.0)
Anion gap: 11 (ref 5–15)
Anion gap: 7 (ref 5–15)
BUN: 32 mg/dL — ABNORMAL HIGH (ref 8–23)
BUN: 32 mg/dL — ABNORMAL HIGH (ref 8–23)
CO2: 23 mmol/L (ref 22–32)
CO2: 25 mmol/L (ref 22–32)
Calcium: 7.9 mg/dL — ABNORMAL LOW (ref 8.9–10.3)
Calcium: 7.5 mg/dL — ABNORMAL LOW (ref 8.9–10.3)
Chloride: 100 mmol/L (ref 98–111)
Chloride: 101 mmol/L (ref 98–111)
Creatinine, Ser: 1.37 mg/dL — ABNORMAL HIGH (ref 0.61–1.24)
Creatinine, Ser: 1.27 mg/dL — ABNORMAL HIGH (ref 0.61–1.24)
GFR, Estimated: 55 mL/min — ABNORMAL LOW (ref >60)
GFR, Estimated: >60 mL/min (ref >60)
Glucose, Bld: 139 mg/dL — ABNORMAL HIGH (ref 70–99)
Glucose, Bld: 198 mg/dL — ABNORMAL HIGH (ref 70–99)
Phosphorus: 1.6 mg/dL — ABNORMAL LOW (ref 2.5–4.6)
Phosphorus: 4.4 mg/dL (ref 2.5–4.6)
Potassium: 4.2 mmol/L (ref 3.5–5.1)
Potassium: 4.9 mmol/L (ref 3.5–5.1)
Sodium: 134 mmol/L — ABNORMAL LOW (ref 135–145)
Sodium: 133 mmol/L — ABNORMAL LOW (ref 135–145)

## 2024-03-31 LAB — COOXEMETRY PANEL
Carboxyhemoglobin: 1.1 % (ref 0.5–1.5)
Carboxyhemoglobin: 0.4 % — ABNORMAL LOW (ref 0.5–1.5)
Carboxyhemoglobin: 1.8 % — ABNORMAL HIGH (ref 0.5–1.5)
Carboxyhemoglobin: 1.9 % — ABNORMAL HIGH (ref 0.5–1.5)
Methemoglobin: <0.7 % (ref 0.0–1.5)
Methemoglobin: <0.7 % (ref 0.0–1.5)
Methemoglobin: <0.7 % (ref 0.0–1.5)
Methemoglobin: <0.7 % (ref 0.0–1.5)
O2 Saturation: 46.7 %
O2 Saturation: 32.6 %
O2 Saturation: 53.2 %
O2 Saturation: 63.9 %
Total hemoglobin: 11.2 g/dL — ABNORMAL LOW (ref 12.0–16.0)
Total hemoglobin: 10.7 g/dL — ABNORMAL LOW (ref 12.0–16.0)
Total hemoglobin: 10.4 g/dL — ABNORMAL LOW (ref 12.0–16.0)
Total hemoglobin: 10 g/dL — ABNORMAL LOW (ref 12.0–16.0)

## 2024-03-31 LAB — GLUCOSE, CAPILLARY
Glucose-Capillary: 127 mg/dL — ABNORMAL HIGH (ref 70–99)
Glucose-Capillary: 161 mg/dL — ABNORMAL HIGH (ref 70–99)
Glucose-Capillary: 171 mg/dL — ABNORMAL HIGH (ref 70–99)
Glucose-Capillary: 196 mg/dL — ABNORMAL HIGH (ref 70–99)
Glucose-Capillary: 147 mg/dL — ABNORMAL HIGH (ref 70–99)

## 2024-03-31 LAB — MAGNESIUM: Magnesium: 2.2 mg/dL (ref 1.7–2.4)

## 2024-03-31 LAB — TRIGLYCERIDES: Triglycerides: 76 mg/dL (ref <150)

## 2024-03-31 LAB — HEPARIN LEVEL (UNFRACTIONATED): Heparin Unfractionated: 0.42 [IU]/mL (ref 0.30–0.70)

## 2024-03-31 MED ORDER — SUCRALFATE 1 GM/10ML PO SUSP
1.0000 g | Freq: Four times a day (QID) | ORAL | Status: DC
  Administered 2024-03-31 – 2024-04-01 (×4): 1 g
  Filled 2024-03-31 (×5): qty 10

## 2024-03-31 MED ORDER — CLONAZEPAM 0.5 MG PO TABS
0.5000 mg | ORAL_TABLET | Freq: Two times a day (BID) | ORAL | Status: DC
  Administered 2024-03-31 (×2): 0.5 mg via ORAL
  Filled 2024-03-31 (×3): qty 1

## 2024-03-31 MED ORDER — QUETIAPINE FUMARATE 25 MG PO TABS
25.0000 mg | ORAL_TABLET | Freq: Two times a day (BID) | ORAL | Status: DC
  Administered 2024-03-31: 25 mg via ORAL
  Filled 2024-03-31: qty 1

## 2024-03-31 MED ORDER — HYDROMORPHONE HCL 1 MG/ML IJ SOLN
1.0000 mg | Freq: Once | INTRAMUSCULAR | Status: AC | PRN
  Administered 2024-03-31: 1 mg via INTRAVENOUS
  Filled 2024-03-31: qty 1

## 2024-03-31 MED ORDER — INSULIN GLARGINE 100 UNIT/ML ~~LOC~~ SOLN
10.0000 [IU] | Freq: Every day | SUBCUTANEOUS | Status: DC
  Administered 2024-04-01 – 2024-04-11 (×11): 10 [IU] via SUBCUTANEOUS
  Filled 2024-03-31 (×12): qty 0.1

## 2024-03-31 MED ORDER — SODIUM PHOSPHATES 45 MMOLE/15ML IV SOLN
45.0000 mmol | Freq: Once | INTRAVENOUS | Status: AC
  Administered 2024-03-31: 45 mmol via INTRAVENOUS
  Filled 2024-03-31: qty 15

## 2024-03-31 MED ORDER — INSULIN GLARGINE 100 UNIT/ML ~~LOC~~ SOLN
5.0000 [IU] | Freq: Once | SUBCUTANEOUS | Status: DC
  Filled 2024-03-31: qty 0.05

## 2024-03-31 MED ORDER — CLONIDINE HCL 0.1 MG PO TABS
0.1000 mg | ORAL_TABLET | Freq: Three times a day (TID) | ORAL | Status: DC
  Administered 2024-03-31: 0.1 mg via ORAL
  Filled 2024-03-31: qty 1

## 2024-03-31 MED ORDER — NUTRISOURCE FIBER PO PACK
1.0000 | PACK | Freq: Two times a day (BID) | ORAL | Status: DC
  Administered 2024-03-31 – 2024-04-18 (×35): 1
  Filled 2024-03-31 (×31): qty 1

## 2024-03-31 MED ORDER — QUETIAPINE FUMARATE 50 MG PO TABS
25.0000 mg | ORAL_TABLET | Freq: Two times a day (BID) | ORAL | Status: DC
  Administered 2024-03-31 – 2024-04-09 (×18): 25 mg
  Filled 2024-03-31 (×18): qty 1

## 2024-03-31 MED ORDER — CLONIDINE HCL 0.1 MG PO TABS
0.1000 mg | ORAL_TABLET | Freq: Three times a day (TID) | ORAL | Status: DC
  Administered 2024-03-31 – 2024-04-01 (×3): 0.1 mg
  Filled 2024-03-31 (×3): qty 1

## 2024-03-31 MED ORDER — AMIODARONE HCL 200 MG PO TABS
200.0000 mg | ORAL_TABLET | Freq: Two times a day (BID) | ORAL | Status: DC
  Administered 2024-03-31: 200 mg
  Filled 2024-03-31: qty 1

## 2024-03-31 MED ADMIN — Famotidine in NaCl 0.9% IV Soln 20 MG/50ML: 20 mg | INTRAVENOUS | NDC 00338519741

## 2024-03-31 NOTE — Progress Notes (Signed)
 Patient ID: Billy [name], male   DOB: 27-Apr-1950, 73 y.o.   MRN: [id number]     Advanced Heart Failure Rounding Note  Cardiologist: Dorn Lesches, MD  Chief Complaint: S/p esophageal stent. AHF reconsulted 12/5 for A/C HFpEF.  Subjective:   - 10/6: esoph stent placement - 10/15: S/p EGD with stent repositioning by Dr. Shyrl  - 10/23: Ltd echo EF 60-65%  - 10/24: Barium swallow showed persistent leak  - 11/3: PEG tube placed - 11/11: L CT removed - 11/19: SVT in cephalic vein - 11/26: Stent removed. Esophagram (-) for esophageal leak - 11/28: Duodenitis/Enteritis. GI consulted. + bloody emesis.  - 12/2: EDG w/ ischemic ulcers. Carafate  increased + BID PPI - 12/4: PCCM consulted for increased WOB and progressive hypoxia.  - 12/5: AHF re consulted for A/C HFpEF - 12/10: Transferred back to ICU with acute respiratory distress, reintubated. Concern for drug-induced lupus 2/2 hydralazine . CRRT started.  Echo showed EF 60-65% with basal inferior akinesis, moderate LVH, normal RV size and mildly decreased systolic function.   Has had long, complicated hospitalization. Day: 70  Off pressors and now on clevidipine  gtt 8 to manage BP.  CVP <5, CVVH running net negative 100 cc/hr UF yesterday.  Weight 11 lbs.   He is in NSR, on amiodarone  po and heparin  gtt. Had some AF overnight.   He is now on Solumedrol 60 mg IV daily.   Remains on linezolid  for MRSA in sputum from 12/10.    Objective:    Weight Range: 77.4 kg Body mass index is 22.51 kg/m.   Vital Signs:   Temp:  [98.6 F (37 C)-99.9 F (37.7 C)] 99.3 F (37.4 C) (12/14 0830) Pulse Rate:  [68-204] 80 (12/14 0830) Resp:  [13-45] 22 (12/14 0830) BP: (148)/(48) 148/48 (12/13 0941) SpO2:  [74 %-100 %] 100 % (12/14 0830) Arterial Line BP: (86-172)/(40-85) 148/59 (12/14 0830) Weight:  [77.4 kg] 77.4 kg (12/14 0500) Last BM Date : 03/29/24  Weight change: Filed Weights   03/29/24 0443 03/30/24 0600 03/31/24 0500  Weight: 85.7  kg 82.3 kg 77.4 kg   Intake/Output:  Intake/Output Summary (Last 24 hours) at 03/31/2024 0935 Last data filed at 03/31/2024 0800 Gross per 24 hour  Intake 3675.89 ml  Output 7562 ml  Net -3886.11 ml    Physical Exam   General: NAD Neck: No JVD, no thyromegaly or thyroid  nodule.  Lungs: Clear to auscultation bilaterally with normal respiratory effort. CV: Nondisplaced PMI.  Heart regular S1/S2, no S3/S4, no murmur.  No peripheral edema.   Abdomen: Soft, nontender, no hepatosplenomegaly, no distention.  Skin: Intact without lesions or rashes.  Neurologic: Alert and oriented x 3.  Psych: Normal affect. Extremities: No clubbing or cyanosis.  HEENT: Normal.   Telemetry   NSR 70s (Personally reviewed)    Labs    CBC Recent Labs    03/30/24 0420 03/31/24 0552  WBC 21.2* 26.2*  HGB 9.7* 11.3*  HCT 27.4* 34.6*  MCV 86.2 84.0  PLT 301 337   Basic Metabolic Panel Recent Labs    87/86/74 0420 03/30/24 1536 03/31/24 0552  NA 132* 136 134*  K 4.8 4.9 4.2  CL 100 102 100  CO2 26 25 23   GLUCOSE 182* 229* 139*  BUN 33* 39* 32*  CREATININE 1.27* 1.44* 1.37*  CALCIUM  7.1* 7.6* 7.9*  MG 2.3  --  2.2  PHOS 1.5* 3.4 1.6*   Liver Function Tests Recent Labs    03/30/24 1536 03/31/24 0552  ALBUMIN   1.8* 2.0*   BNP (last 3 results) Recent Labs    06/27/23 1116 03/22/24 0913 03/25/24 0535  BNP 646.4* 2,404.1* 831.9*   Hemoglobin A1C No results for input(s): HGBA1C in the last 72 hours.  Fasting Lipid Panel Recent Labs    03/31/24 0552  TRIG 76    Medications:    Scheduled Medications:  amiodarone   200 mg Per Tube BID   atorvastatin   80 mg Per Tube Daily   Chlorhexidine  Gluconate Cloth  6 each Topical Daily   clonazePAM   0.5 mg Oral BID   cloNIDine   0.1 mg Oral TID   ezetimibe   10 mg Per Tube Daily   feeding supplement (PROSource TF20)  60 mL Per Tube BID   [name] butt cream   Topical BID   guaiFENesin   10 mL Per Tube Q4H   insulin  aspart  0-9  Units Subcutaneous Q4H   insulin  glargine  5 Units Subcutaneous Daily   lidocaine   1 patch Transdermal Q24H   methylPREDNISolone  (SOLU-MEDROL ) injection  60 mg Intravenous Daily   multivitamin  1 tablet Per Tube QHS   mupirocin  ointment  1 Application Nasal BID   pantoprazole  (PROTONIX ) IV  40 mg Intravenous Q12H   QUEtiapine   25 mg Oral BID   saccharomyces boulardii  250 mg Per Tube BID   sodium chloride  flush  10-40 mL Intracatheter Q12H   sucralfate   1 g Oral Q6H    Infusions:  clevidipine  2 mg/hr (03/31/24 0804)   famotidine  (PEPCID ) IV Stopped (03/30/24 2031)   feeding supplement (VITAL 1.5 CAL) 1,000 mL (03/31/24 0904)   heparin  1,800 Units/hr (03/31/24 0800)   linezolid  (ZYVOX ) IV Stopped (03/30/24 2232)   PrismaSol  BGK 2/3.5 500 mL/hr at 03/30/24 1730   PrismaSol  BGK 2/3.5 400 mL/hr at 03/31/24 0207   prismasol  BGK 4/2.5 1,500 mL/hr at 03/31/24 0930   promethazine  (PHENERGAN ) injection (IM or IVPB) Stopped (03/18/24 0911)   sodium PHOSPHATE  IVPB (in mmol)      PRN Medications: acetaminophen  (TYLENOL ) oral liquid 160 mg/5 mL, alum & mag hydroxide-simeth, diclofenac  Sodium, [COMPLETED] diphenoxylate -atropine  **FOLLOWED BY** diphenoxylate -atropine , docusate, glucagon  (human recombinant), guaiFENesin , heparin , heparin , heparin , HYDROmorphone  (DILAUDID ) injection, hydrOXYzine , lip balm, mouth rinse, mouth rinse, oxyCODONE , polyethylene glycol, promethazine  (PHENERGAN ) injection (IM or IVPB), simethicone , sodium chloride , sodium chloride  flush, traZODone   Patient Profile  Patient with longstanding history of chronic heart failure with preserved ejection fraction, RV failure with history of pulmonary embolism presents with esophageal perforation.  S/P Stent Esophageal Stent. Stent repositioned 10/15. AHF signed off 11/10. Re-consulted 03/22/24 with A/C HFpEF.  Assessment/Plan  1.  Esophageal Perforation - Boerhaave syndrome.  - CT C/A/P: with pneumomediastinum and large mass-like  density in lower esophagus - 10/6: EGD with esophageal stent placed  - 10/15: Stent migrated w/ leak > back to OR for repositioning  - 10/24: barium swallow w persistent leak  - 11/3: PEG placed - on TF and meds by PEG.  - 11/26: Stent removed. Esophagram (-) for esophageal leak - 12/2: EDG w/ ischemic ulcers. Carafate  increased + BID PPI/ - Remains NPO   2. Acute on chronic HFpEF - h/o cardiogenic shock with RV failure in the setting of cardiac arrest thought to be caused by acute PE vs ACS in 3/25. Echo at the time showed EF 55%, severe, LVH, G2DD, and severely reduced RV function with strain. There was concern for cardiac sarcoid amyloid, however CMR LGE consistent with ischemic disease. RV on echo 9/25 with complete recovery, EF 60-65%, nl RV  function.    - RHC 3/25: Mild PAH with severe RV failure though CO is preserved.   - Echo 10/25 EF 60-65%, RV normal.  - Echo 12/10: EF 60-65% with basal inferior akinesis, moderate LVH, normal RV size and mildly decreased systolic function.  - Off pressors and now on clevidipine  for HTN.   - Has had significant peripheral edema though CVP now < 5.  Weight down 11 lbs and peripheral edema much improved.  I think we can cut back on CVVH to net negative 50 cc/hr today and potentially stop tomorrow, assess for iHD needs.  - Co-ox 33% this morning but clinically looks much better and echo from 12/10 is not consistent with this.  Had been moving around out of bed a lot when this was done, repeat later this morning.   3. CAD:  - LHC 3/25: 3v CAD with CTO RCA with L->R collaterals and moderate non-obstructive CAD in L system. Medical management.  - suspect CP related to esophageal issues and not CAD - No chest pain.     4.  AKI on CKD Stage 3b: Baseline sCr ~ 2.1. - Worsening AKI> CRRT - L RAS. Continue CRRT  - Decrease UF to 50 cc/hr net negative today, hopefully stop tomorrow and assess for iHD needs.    5. PAF:   - s/p TEE/DCCV 3/25 - Had some AF  last night.  - In NSR on po amiodarone  currently, will increase to bid.   - Continue heparin  gtt.   6.  H/o DVT/PE:  Bilateral upper and lower DVTs, mixed acute and chronic.  - s/p infrarenal IVC placement 3/25 - V/Q 3/25 with multiple perfusion defects - on Heparin  drip.    7. Carotid stenosis: h/o CVA. TCAR in 6/24. Okay to remain off plavix  at this point. - carotid US  9/25: widely patent carotid stent on R and no stenosis on L   8. Acute Hypoxic Respiratory Failure/PNA/ Exudative Lt Pleural Effusion  - CT placed for pleural effusion. Resp Cx w/ rare yeast, BCx NGTD  - Re-intubated 12/10 per CCM.  - Extubated 12/12.   9. Severe protein calorie malnutrition - Nutrition following. PEG tube + TF  10. HTN - Clevidipine  IV - He has had problems with both hydralazine  and amlodipine .  Starting clonidine  today for anxiety, should also help with BP.    11. Superficial LUE thrombus - Continue PICC - heparin  gtt  12. Petechial Rash - With diffuse petechial rash, ANA+, anti-histone ab+, and low C4, there is concern for possible drug-induced lupus from hydralazine  with AKI.  ANCA negative.  - Discussed with nephrology and CCM, would ideally get a renal biopsy.  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 Would like to get eventually.  - He is now on empiric Solumedrol.  Not ideal with friable esophagus but tolerating so far.   13. Anemia Hgb 11.3, had transfusion 12/12.   CRITICAL CARE Performed by: Ezra Shuck  Total critical care time: 35 minutes  Critical care time was exclusive of separately billable procedures and treating other patients.  Critical care was necessary to treat or prevent imminent or life-threatening deterioration.  Critical care was time spent personally by me on the following activities: development of treatment plan with patient and/or surrogate as well as nursing,  discussions with consultants, evaluation of patient's response to treatment, examination of patient, obtaining history from patient or surrogate, ordering  and performing treatments and interventions, ordering and review of laboratory studies, ordering and review of radiographic studies, pulse oximetry and re-evaluation of patient's condition.  Ezra Shuck 03/31/2024 9:35 AM

## 2024-03-31 NOTE — Progress Notes (Signed)
 [facility] Progress Note   Assessment/ Plan:    AKI on CKD 3b             - HTN, backdrop of pretty bad L-sided RAS (occlusive)--> so essentially he has 1 functioning kidney and is more susceptible to volume status/ nephrotoxic agents             - off Losartan  and torsemide              - on CRRT- all 4K, no heparin  for now-- increase fluid removal, hoepfully can come off cleviprex  when fluid better  - ANA +, C4 low, + hematuria, anti-histone Ab +- will need a biopsy at some point- appreciate IR  - sig inflamed mucosa and ulcers on EGD 03/19/24--> review by PCCM suggest ischemic ulcers  - off unasyn  and hydralazine   - starteded solumedrol 03/27/24  - will need to touch base with IR- I have made him NPO past MN tonight in case they can do it  - would be OK to stop CRRT for him to go down to get the biopsy- if we continue to get great fluid removal perhaps can switch to IHD   2.  Petechial rash;             - appears like a drug rash             - stop unasyn , I stopped hydralazine  too   3.  Acute on chronic CHF exacerbation;             - on CRRT   4.  H/o DVT and PE:             - had RH strain previously             - Eliquis  on hold for now- per primary  - hep gtt   5.  Boorehave's syndrome             - s/p repair             - on carafate -- can resume now             - Has a PEG  - EGD 03/19/24 with ulcers  - PPI and pepcid    6.  Terminal Ileitis/ Duodenitis             - seen on CT scan 03/24/24   7.  Dispo: inpt  Subjective:    Feeling slightly better than yesterday.  Continues on CRRT.     Objective:   BP (!) 111/45   Pulse 77   Temp 98.8 F (37.1 C)   Resp 20   Ht 6' 1 (1.854 m)   Wt 77.4 kg   SpO2 100%   BMI 22.51 kg/m   Intake/Output Summary (Last 24 hours) at 03/31/2024 1017 Last data filed at 03/31/2024 1000 Gross per 24 hour  Intake 3628.59 ml  Output 7654 ml  Net -4025.41 ml   Weight change: -4.9 kg  Physical Exam: GEN  awake HEENT EOMI EPRRL NECK no JVD PULM wet and coarse bilaterally CVRRR ABD + PEG, slightly distended EXT 3+ anasarca, UNNA boots SKIN: petechial nonblanching rash thighs, palms, arms NEURO  AAO x 3  Imaging: DG Abd Portable 1V Result Date: 03/30/2024 CLINICAL DATA:  Abdominal pain. EXAM: PORTABLE ABDOMEN - 1 VIEW COMPARISON:  Abdomen and pelvis CT dated 03/24/2024. FINDINGS: Normal bowel-gas pattern. PEG tube in place. Mild bilateral hip degenerative changes. Moderate lumbar spine degenerative changes. Inferior  vena cava filter in the expected position on the right. Chest findings described in the separate chest report. IMPRESSION: No acute abdominal abnormality. Electronically Signed   By: Elspeth Bathe M.D.   On: 03/30/2024 11:26   DG CHEST PORT 1 VIEW Result Date: 03/30/2024 CLINICAL DATA:  Follow-up bilateral pleural effusions and lung opacities. EXAM: PORTABLE CHEST 1 VIEW COMPARISON:  03/29/2024 FINDINGS: The endotracheal tube has been removed. A right jugular catheter remains in place with its tip in the superior vena cava. A right PICC remains in place with its tip in the region of the superior cavoatrial junction. Minimal left pleural effusion, decreased. Stable small right lateral pleural effusion. No significant change in patchy densities in the right lower lung zone and left mid lung zone. Borderline enlarged cardiac silhouette. Tortuous and partially calcified thoracic aorta. Left neck surgical clips. Unremarkable bones. IMPRESSION: 1. Minimal left pleural effusion, decreased. 2. Stable small right lateral pleural effusion. 3. Stable patchy densities in the right lower lung zone and left mid lung zone, suspicious for pneumonia. Electronically Signed   By: Elspeth Bathe M.D.   On: 03/30/2024 11:24   DG CHEST PORT 1 VIEW Result Date: 03/29/2024 CLINICAL DATA:  ARDS EXAM: PORTABLE CHEST 1 VIEW COMPARISON:  Yesterday FINDINGS: Stable cardiomediastinal silhouette. Endotracheal tube is  unchanged. Right-sided PICC line and right internal jugular catheter unchanged. Stable mild patchy and interstitial densities are noted concerning for edema or inflammation with stable bilateral pleural effusions, right greater than left. IMPRESSION: Stable support apparatus. Stable bilateral lung opacities as described above. Electronically Signed   By: Lynwood Landy Raddle M.D.   On: 03/29/2024 13:16    Labs: BMET Recent Labs  Lab 03/28/24 0347 03/28/24 1336 03/28/24 1448 03/28/24 1556 03/29/24 0415 03/29/24 1544 03/30/24 0420 03/30/24 1536 03/31/24 0552  NA 138   < > 135 137 134* 133* 132* 136 134*  K 5.0   < > 5.0 5.2* 5.1 5.3* 4.8 4.9 4.2  CL 107  --   --  102 102 100 100 102 100  CO2 22  --   --  27 25 27 26 25 23   GLUCOSE 195*  --   --  211* 164* 223* 182* 229* 139*  BUN 61*  --   --  51* 41* 39* 33* 39* 32*  CREATININE 2.69*  --   --  2.17* 1.78* 1.63* 1.27* 1.44* 1.37*  CALCIUM  7.7*  --   --  7.4* 7.0* 7.3* 7.1* 7.6* 7.9*  PHOS 2.7  --   --  2.8 2.8 3.2 1.5* 3.4 1.6*   < > = values in this interval not displayed.   CBC Recent Labs  Lab 03/25/24 0535 03/26/24 0809 03/27/24 0500 03/27/24 1152 03/28/24 0347 03/28/24 1336 03/29/24 0415 03/29/24 1545 03/30/24 0420 03/31/24 0552  WBC 15.6* 17.2* 18.0*  --  20.1*  --  22.4*  --  21.2* 26.2*  NEUTROABS 13.5* 15.2* 16.1*  --   --   --   --   --   --   --   HGB 9.5* 8.6* 8.0*   < > 7.6*   < > 7.3* 8.8* 9.7* 11.3*  HCT 30.2* 27.7* 25.5*   < > 23.7*   < > 24.1* 28.7* 27.4* 34.6*  MCV 86.3 86.6 87.9  --  84.9  --  88.0  --  86.2 84.0  PLT 224 226 229  --  290  --  305  --  301 337   < > =  values in this interval not displayed.    Medications:     amiodarone   200 mg Per Tube BID   atorvastatin   80 mg Per Tube Daily   Chlorhexidine  Gluconate Cloth  6 each Topical Daily   clonazePAM   0.5 mg Oral BID   cloNIDine   0.1 mg Per Tube TID   ezetimibe   10 mg Per Tube Daily   feeding supplement (PROSource TF20)  60 mL Per Tube BID    [name] butt cream   Topical BID   guaiFENesin   10 mL Per Tube Q4H   insulin  aspart  0-9 Units Subcutaneous Q4H   [START ON 04/01/2024] insulin  glargine  10 Units Subcutaneous Daily   insulin  glargine  5 Units Subcutaneous Once   lidocaine   1 patch Transdermal Q24H   methylPREDNISolone  (SOLU-MEDROL ) injection  60 mg Intravenous Daily   multivitamin  1 tablet Per Tube QHS   mupirocin  ointment  1 Application Nasal BID   pantoprazole  (PROTONIX ) IV  40 mg Intravenous Q12H   QUEtiapine   25 mg Per Tube BID   saccharomyces boulardii  250 mg Per Tube BID   sodium chloride  flush  10-40 mL Intracatheter Q12H   sucralfate   1 g Per Tube Q6H    Almarie Bonine MD 03/31/2024, 10:17 AM

## 2024-03-31 NOTE — Progress Notes (Signed)
 eLink Physician-Brief Progress Note Patient Name: Billy [name] DOB: 01-Dec-1950 MRN: [id number]   Date of Service  03/31/2024  HPI/Events of Note  Dayshift BSRN pulled flexi- seal. Now with freq loose stool asking for it back  No identified contraindication, last Platelets 301.  eICU Interventions  Ordered to reinsert Flexiseal     Intervention Category Minor Interventions: Other:  Damien ONEIDA Grout 03/31/2024, 3:32 AM
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
## 2024-03-31 NOTE — Progress Notes (Signed)
 eLink Physician-Brief Progress Note Patient Name: Billy [name] DOB: 05-06-1950 MRN: [id number]   Date of Service  03/31/2024  HPI/Events of Note  Patient complaining peg site pain unrelieved by dilaudid  0.5 which he claims has been going on for a good while when asked if this pain started recently.  He had CT scan done 12/7 which showed signs of duodenitis and terminal ileitis and abdominal xray yesterday to no acute findings  eICU Interventions  Ordered a one time dose of dilaudid  1mg  Discussed with BSRN     Intervention Category Intermediate Interventions: Pain - evaluation and management  Damien ONEIDA Grout 03/31/2024, 4:43 AM

## 2024-04-01 DIAGNOSIS — I5033 Acute on chronic diastolic (congestive) heart failure: Secondary | ICD-10-CM | POA: Diagnosis not present

## 2024-04-01 DIAGNOSIS — J9601 Acute respiratory failure with hypoxia: Secondary | ICD-10-CM | POA: Diagnosis not present

## 2024-04-01 DIAGNOSIS — N1832 Chronic kidney disease, stage 3b: Secondary | ICD-10-CM | POA: Diagnosis not present

## 2024-04-01 DIAGNOSIS — K223 Perforation of esophagus: Secondary | ICD-10-CM | POA: Diagnosis not present

## 2024-04-01 DIAGNOSIS — I5023 Acute on chronic systolic (congestive) heart failure: Secondary | ICD-10-CM | POA: Diagnosis not present

## 2024-04-01 DIAGNOSIS — I130 Hypertensive heart and chronic kidney disease with heart failure and stage 1 through stage 4 chronic kidney disease, or unspecified chronic kidney disease: Secondary | ICD-10-CM | POA: Diagnosis not present

## 2024-04-01 DIAGNOSIS — I480 Paroxysmal atrial fibrillation: Secondary | ICD-10-CM | POA: Diagnosis not present

## 2024-04-01 DIAGNOSIS — K529 Noninfective gastroenteritis and colitis, unspecified: Secondary | ICD-10-CM | POA: Diagnosis not present

## 2024-04-01 DIAGNOSIS — J90 Pleural effusion, not elsewhere classified: Secondary | ICD-10-CM | POA: Diagnosis not present

## 2024-04-01 DIAGNOSIS — E785 Hyperlipidemia, unspecified: Secondary | ICD-10-CM | POA: Diagnosis not present

## 2024-04-01 DIAGNOSIS — Z86711 Personal history of pulmonary embolism: Secondary | ICD-10-CM | POA: Diagnosis not present

## 2024-04-01 DIAGNOSIS — N183 Chronic kidney disease, stage 3 unspecified: Secondary | ICD-10-CM | POA: Diagnosis not present

## 2024-04-01 DIAGNOSIS — N179 Acute kidney failure, unspecified: Secondary | ICD-10-CM | POA: Diagnosis not present

## 2024-04-01 DIAGNOSIS — E43 Unspecified severe protein-calorie malnutrition: Secondary | ICD-10-CM | POA: Diagnosis not present

## 2024-04-01 DIAGNOSIS — J15212 Pneumonia due to Methicillin resistant Staphylococcus aureus: Secondary | ICD-10-CM | POA: Diagnosis not present

## 2024-04-01 LAB — RENAL FUNCTION PANEL
Albumin: 1.8 g/dL — ABNORMAL LOW (ref 3.5–5.0)
Albumin: 1.7 g/dL — ABNORMAL LOW (ref 3.5–5.0)
Anion gap: 8 (ref 5–15)
Anion gap: 6 (ref 5–15)
BUN: 28 mg/dL — ABNORMAL HIGH (ref 8–23)
BUN: 29 mg/dL — ABNORMAL HIGH (ref 8–23)
CO2: 25 mmol/L (ref 22–32)
CO2: 25 mmol/L (ref 22–32)
Calcium: 7.4 mg/dL — ABNORMAL LOW (ref 8.9–10.3)
Calcium: 7.3 mg/dL — ABNORMAL LOW (ref 8.9–10.3)
Chloride: 99 mmol/L (ref 98–111)
Chloride: 103 mmol/L (ref 98–111)
Creatinine, Ser: 1.09 mg/dL (ref 0.61–1.24)
Creatinine, Ser: 1.29 mg/dL — ABNORMAL HIGH (ref 0.61–1.24)
GFR, Estimated: >60 mL/min (ref >60)
GFR, Estimated: 59 mL/min — ABNORMAL LOW (ref >60)
Glucose, Bld: 95 mg/dL (ref 70–99)
Glucose, Bld: 161 mg/dL — ABNORMAL HIGH (ref 70–99)
Phosphorus: 2.5 mg/dL (ref 2.5–4.6)
Phosphorus: 2.8 mg/dL (ref 2.5–4.6)
Potassium: 4.4 mmol/L (ref 3.5–5.1)
Potassium: 4.8 mmol/L (ref 3.5–5.1)
Sodium: 132 mmol/L — ABNORMAL LOW (ref 135–145)
Sodium: 134 mmol/L — ABNORMAL LOW (ref 135–145)

## 2024-04-01 LAB — CBC
HCT: 30.3 % — ABNORMAL LOW (ref 39.0–52.0)
Hemoglobin: 11.2 g/dL — ABNORMAL LOW (ref 13.0–17.0)
MCH: 31.8 pg (ref 26.0–34.0)
MCHC: 37 g/dL — ABNORMAL HIGH (ref 30.0–36.0)
MCV: 86.1 fL (ref 80.0–100.0)
Platelets: 268 K/uL (ref 150–400)
RBC: 3.52 MIL/uL — ABNORMAL LOW (ref 4.22–5.81)
RDW: 15.9 % — ABNORMAL HIGH (ref 11.5–15.5)
WBC: 22.9 K/uL — ABNORMAL HIGH (ref 4.0–10.5)
nRBC: 0.1 % (ref 0.0–0.2)

## 2024-04-01 LAB — CULTURE, BLOOD (ROUTINE X 2)
Culture: NO GROWTH
Culture: NO GROWTH
Special Requests: ADEQUATE
Special Requests: ADEQUATE

## 2024-04-01 LAB — MAGNESIUM: Magnesium: 2.5 mg/dL — ABNORMAL HIGH (ref 1.7–2.4)

## 2024-04-01 LAB — GLUCOSE, CAPILLARY
Glucose-Capillary: 104 mg/dL — ABNORMAL HIGH (ref 70–99)
Glucose-Capillary: 108 mg/dL — ABNORMAL HIGH (ref 70–99)
Glucose-Capillary: 111 mg/dL — ABNORMAL HIGH (ref 70–99)
Glucose-Capillary: 130 mg/dL — ABNORMAL HIGH (ref 70–99)
Glucose-Capillary: 142 mg/dL — ABNORMAL HIGH (ref 70–99)
Glucose-Capillary: 149 mg/dL — ABNORMAL HIGH (ref 70–99)
Glucose-Capillary: 159 mg/dL — ABNORMAL HIGH (ref 70–99)
Glucose-Capillary: 37 mg/dL — CL (ref 70–99)
Glucose-Capillary: 66 mg/dL — ABNORMAL LOW (ref 70–99)

## 2024-04-01 LAB — COOXEMETRY PANEL
Carboxyhemoglobin: 0.9 % (ref 0.5–1.5)
Methemoglobin: 1.8 % — ABNORMAL HIGH (ref 0.0–1.5)
O2 Saturation: 58.5 %
Total hemoglobin: 10.4 g/dL — ABNORMAL LOW (ref 12.0–16.0)

## 2024-04-01 LAB — HEPARIN LEVEL (UNFRACTIONATED): Heparin Unfractionated: 0.54 [IU]/mL (ref 0.30–0.70)

## 2024-04-01 LAB — APTT
aPTT: 180 s (ref 24–36)
aPTT: 107 s — ABNORMAL HIGH (ref 24–36)

## 2024-04-01 MED ORDER — SUCRALFATE 1 GM/10ML PO SUSP
1.0000 g | Freq: Four times a day (QID) | ORAL | Status: AC
  Administered 2024-04-01 – 2024-04-02 (×5): 1 g
  Filled 2024-04-01 (×6): qty 10

## 2024-04-01 MED ORDER — AMIODARONE HCL IN DEXTROSE 360-4.14 MG/200ML-% IV SOLN
30.0000 mg/h | INTRAVENOUS | Status: DC
  Administered 2024-04-01 – 2024-04-03 (×5): 30 mg/h via INTRAVENOUS
  Filled 2024-04-01 (×5): qty 200

## 2024-04-01 MED ORDER — LINEZOLID 600 MG PO TABS
600.0000 mg | ORAL_TABLET | Freq: Two times a day (BID) | ORAL | Status: AC
  Administered 2024-04-01 – 2024-04-02 (×3): 600 mg
  Filled 2024-04-01 (×3): qty 1

## 2024-04-01 MED ORDER — MINOXIDIL 2.5 MG PO TABS
5.0000 mg | ORAL_TABLET | Freq: Every day | ORAL | Status: DC
  Filled 2024-04-01: qty 2

## 2024-04-01 MED ORDER — AMIODARONE LOAD VIA INFUSION
150.0000 mg | Freq: Once | INTRAVENOUS | Status: AC
  Administered 2024-04-01: 09:00:00 150 mg via INTRAVENOUS
  Filled 2024-04-01: qty 83.34

## 2024-04-01 MED ORDER — FAMOTIDINE IN NACL 20-0.9 MG/50ML-% IV SOLN: 20.0000 mg | Freq: Two times a day (BID) | INTRAVENOUS | Status: DC | PRN

## 2024-04-01 MED ORDER — MINOXIDIL 2.5 MG PO TABS
5.0000 mg | ORAL_TABLET | Freq: Every day | ORAL | Status: DC
  Administered 2024-04-01: 11:00:00 5 mg
  Filled 2024-04-01 (×2): qty 2

## 2024-04-01 MED ORDER — LINEZOLID 600 MG PO TABS: 600.0000 mg | ORAL_TABLET | Freq: Two times a day (BID) | ORAL | Status: DC

## 2024-04-01 MED ORDER — CLONAZEPAM 0.5 MG PO TABS
0.5000 mg | ORAL_TABLET | Freq: Two times a day (BID) | ORAL | Status: DC
  Administered 2024-04-01 – 2024-04-27 (×53): 0.5 mg
  Filled 2024-04-01 (×40): qty 1

## 2024-04-01 NOTE — Progress Notes (Signed)
 Billy [name] Progress Note   Assessment/ Plan:    AKI on CKD 3b             - HTN, backdrop of pretty bad L-sided RAS (occlusive)--> so essentially he has 1 functioning kidney and is more susceptible to volume status/ nephrotoxic agents             - off Losartan  and torsemide              - on CRRT (start 12/10)- all 4K, no circuit heparin  for now  - ANA +, C4 low, + hematuria, anti-histone Ab +- will need a biopsy at some point- appreciate IR--currently pending  - sig inflamed mucosa and ulcers on EGD 03/19/24--> review by PCCM suggest ischemic ulcers  - off unasyn  and hydralazine   - starteded solumedrol 03/27/24  - would be OK to stop CRRT for him to go down to get the biopsy- 'no restart' on CRRT otherwise. Tentatively planning on HD either tomorrow or Wed depending on when he stops CRRT. Will continue to monitor for any signs of renal recovery  -will need TDC at some point   2.  Petechial rash;             - appears like a drug rash             - stopped hydralazine  unasyn    3.  Acute on chronic CHF exacerbation;             - on CRRT, UF as tolerated   4.  H/o DVT and PE:             - had RH strain previously             - Eliquis  on hold for now- per primary  - on hep gtt   5.  Boorehave's syndrome             - s/p repair             - Has a PEG  - EGD 03/19/24 with ulcers  - PPI and pepcid    6.  Terminal Ileitis/ Duodenitis             - seen on CT scan 03/24/24   7. HTN  -on cleviprex --titrating down per primary service  Dispo: inpt  Discussed with ICU RN.  Subjective:    Patient seen and examined.  Continues on CRRT.   No complaints.UOP ~120cc Net neg ~0.4L.    Objective:   BP (!) 163/56   Pulse (!) 102   Temp 99.1 F (37.3 C)   Resp (!) 21   Ht 6' 1 (1.854 m)   Wt 76.7 kg   SpO2 100%   BMI 22.31 kg/m   Intake/Output Summary (Last 24 hours) at 04/01/2024 0844 Last data filed at 04/01/2024 0700 Gross per 24 hour  Intake 2971.36 ml   Output 3147 ml  Net -175.64 ml   Weight change: -0.7 kg  Physical Exam: GEN: awake NECK: no JVD PULM: cta bl, unlabored, normal wob CV: RRR ABD: + PEG, slightly distended/soft EXT: no sig edema b/l LEs SKIN: petechial nonblanching rash thighs, palms, arms NEURO: awake, alert, following commands Dialysis access: RIJ temp HD catheter in use  Imaging: No results found.   Labs: BMET Recent Labs  Lab 03/29/24 0415 03/29/24 1544 03/30/24 0420 03/30/24 1536 03/31/24 0552 03/31/24 1550 04/01/24 0428  NA 134* 133* 132* 136 134* 133* 132*  K 5.1 5.3* 4.8 4.9  4.2 4.9 4.4  CL 102 100 100 102 100 101 99  CO2 25 27 26 25 23 25 25   GLUCOSE 164* 223* 182* 229* 139* 198* 95  BUN 41* 39* 33* 39* 32* 32* 28*  CREATININE 1.78* 1.63* 1.27* 1.44* 1.37* 1.27* 1.09  CALCIUM  7.0* 7.3* 7.1* 7.6* 7.9* 7.5* 7.4*  PHOS 2.8 3.2 1.5* 3.4 1.6* 4.4 2.5   CBC Recent Labs  Lab 03/26/24 0809 03/27/24 0500 03/27/24 1152 03/29/24 0415 03/29/24 1545 03/30/24 0420 03/31/24 0552 04/01/24 0428  WBC 17.2* 18.0*   < > 22.4*  --  21.2* 26.2* 22.9*  NEUTROABS 15.2* 16.1*  --   --   --   --   --   --   HGB 8.6* 8.0*   < > 7.3* 8.8* 9.7* 11.3* 11.2*  HCT 27.7* 25.5*   < > 24.1* 28.7* 27.4* 34.6* 30.3*  MCV 86.6 87.9   < > 88.0  --  86.2 84.0 86.1  PLT 226 229   < > 305  --  301 337 268   < > = values in this interval not displayed.    Medications:     amiodarone   150 mg Intravenous Once   atorvastatin   80 mg Per Tube Daily   Chlorhexidine  Gluconate Cloth  6 each Topical Daily   clonazePAM   0.5 mg Oral BID   cloNIDine   0.1 mg Per Tube TID   ezetimibe   10 mg Per Tube Daily   feeding supplement (PROSource TF20)  60 mL Per Tube BID   fiber  1 packet Per Tube BID   [name] butt cream   Topical BID   guaiFENesin   10 mL Per Tube Q4H   insulin  aspart  0-9 Units Subcutaneous Q4H   insulin  glargine  10 Units Subcutaneous Daily   insulin  glargine  5 Units Subcutaneous Once   lidocaine   1 patch  Transdermal Q24H   methylPREDNISolone  (SOLU-MEDROL ) injection  60 mg Intravenous Daily   multivitamin  1 tablet Per Tube QHS   mupirocin  ointment  1 Application Nasal BID   pantoprazole  (PROTONIX ) IV  40 mg Intravenous Q12H   QUEtiapine   25 mg Per Tube BID   saccharomyces boulardii  250 mg Per Tube BID   sodium chloride  flush  10-40 mL Intracatheter Q12H   sucralfate   1 g Per Tube Q6H    Ephriam Stank, MD [name] 04/01/2024, 8:44 AM
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
## 2024-04-01 NOTE — Progress Notes (Signed)
 Orthopedic Tech Progress Note Patient Details:  Myran Arcia Aug 24, 1950 [id number]  Ortho Devices Type of Ortho Device: Radio broadcast assistant Ortho Device/Splint Location: BLE Ortho Device/Splint Interventions: Application, Ordered   Post Interventions Patient Tolerated: Well Instructions Provided: Adjustment of device  Kalinda Romaniello A Burnell Matlin 04/01/2024, 10:29 AM

## 2024-04-01 NOTE — Progress Notes (Addendum)
 Patient ID: Billy [name], male   DOB: 04/20/1950, 72 y.o.   MRN: [id number]     Advanced Heart Failure Rounding Note  Cardiologist: Dorn Lesches, MD  Chief Complaint: S/p esophageal stent. AHF reconsulted 12/5 for A/C HFpEF.  Subjective:   - 10/6: esoph stent placement - 10/15: S/p EGD with stent repositioning by Dr. Shyrl  - 10/23: Ltd echo EF 60-65%  - 10/24: Barium swallow showed persistent leak  - 11/3: PEG tube placed - 11/11: L CT removed - 11/19: SVT in cephalic vein - 11/26: Stent removed. Esophagram (-) for esophageal leak - 11/28: Duodenitis/Enteritis. GI consulted. + bloody emesis.  - 12/2: EDG w/ ischemic ulcers. Carafate  increased + BID PPI - 12/4: PCCM consulted for increased WOB and progressive hypoxia.  - 12/5: AHF re consulted for A/C HFpEF - 12/10: Transferred back to ICU with acute respiratory distress, reintubated. Concern for drug-induced lupus 2/2 hydralazine . CRRT started.  Echo showed EF 60-65% with basal inferior akinesis, moderate LVH, normal RV size and mildly decreased systolic function.   Has had long, complicated hospitalization. Day: 73  Off pressors and now on clevidipine  gtt 7 to manage BP.  CVP <5, CVVH running net negative 50 cc/hr UF yesterday.  Weight down 1lb.   In atrial flutter this morning.   He is now on Solumedrol 60 mg IV daily.   Remains on linezolid  for MRSA in sputum from 12/10.    Feels good, denies CP/SOB.   Objective:    Weight Range: 76.7 kg Body mass index is 22.31 kg/m.   Vital Signs:   Temp:  [98.4 F (36.9 C)-99.5 F (37.5 C)] 99.1 F (37.3 C) (12/15 0435) Pulse Rate:  [70-191] 102 (12/15 0435) Resp:  [14-40] 21 (12/15 0435) BP: (111-163)/(45-56) 163/56 (12/14 1535) SpO2:  [89 %-100 %] 100 % (12/15 0435) Arterial Line BP: (99-168)/(37-90) 130/58 (12/15 0435) Weight:  [76.7 kg] 76.7 kg (12/15 0500) Last BM Date : 03/31/24  Weight change: Filed Weights   03/30/24 0600 03/31/24 0500 04/01/24 0500  Weight:  82.3 kg 77.4 kg 76.7 kg   Intake/Output:  Intake/Output Summary (Last 24 hours) at 04/01/2024 0801 Last data filed at 04/01/2024 0700 Gross per 24 hour  Intake 2971.36 ml  Output 3147 ml  Net -175.64 ml    Physical Exam   General:  chronically ill / elderly appearing.  No respiratory difficulty Neck: JVD ~5 cm. RIJ HD cath Cor: Irregular rate & rhythm.   Lungs: clear Extremities: +1 thigh edema  GU: + foley Neuro: alert & oriented x 3. Affect pleasant.   Telemetry   Atrial flutter 110s (Personally reviewed)    Labs    CBC Recent Labs    03/31/24 0552 04/01/24 0428  WBC 26.2* 22.9*  HGB 11.3* 11.2*  HCT 34.6* 30.3*  MCV 84.0 86.1  PLT 337 268   Basic Metabolic Panel Recent Labs    87/85/74 0552 03/31/24 1550 04/01/24 0428  NA 134* 133* 132*  K 4.2 4.9 4.4  CL 100 101 99  CO2 23 25 25   GLUCOSE 139* 198* 95  BUN 32* 32* 28*  CREATININE 1.37* 1.27* 1.09  CALCIUM  7.9* 7.5* 7.4*  MG 2.2  --  2.5*  PHOS 1.6* 4.4 2.5   Liver Function Tests Recent Labs    03/31/24 1550 04/01/24 0428  ALBUMIN  1.8* 1.8*   BNP (last 3 results) Recent Labs    06/27/23 1116 03/22/24 0913 03/25/24 0535  BNP 646.4* 2,404.1* 831.9*   Hemoglobin A1C No  results for input(s): HGBA1C in the last 72 hours.  Fasting Lipid Panel Recent Labs    03/31/24 0552  TRIG 76    Medications:    Scheduled Medications:  amiodarone   200 mg Per Tube BID   atorvastatin   80 mg Per Tube Daily   Chlorhexidine  Gluconate Cloth  6 each Topical Daily   clonazePAM   0.5 mg Oral BID   cloNIDine   0.1 mg Per Tube TID   ezetimibe   10 mg Per Tube Daily   feeding supplement (PROSource TF20)  60 mL Per Tube BID   fiber  1 packet Per Tube BID   [name] butt cream   Topical BID   guaiFENesin   10 mL Per Tube Q4H   insulin  aspart  0-9 Units Subcutaneous Q4H   insulin  glargine  10 Units Subcutaneous Daily   insulin  glargine  5 Units Subcutaneous Once   lidocaine   1 patch Transdermal Q24H    methylPREDNISolone  (SOLU-MEDROL ) injection  60 mg Intravenous Daily   multivitamin  1 tablet Per Tube QHS   mupirocin  ointment  1 Application Nasal BID   pantoprazole  (PROTONIX ) IV  40 mg Intravenous Q12H   QUEtiapine   25 mg Per Tube BID   saccharomyces boulardii  250 mg Per Tube BID   sodium chloride  flush  10-40 mL Intracatheter Q12H   sucralfate   1 g Per Tube Q6H    Infusions:  clevidipine  7 mg/hr (04/01/24 0700)   famotidine  (PEPCID ) IV Stopped (03/31/24 2015)   feeding supplement (VITAL 1.5 CAL) Stopped (04/01/24 0005)   heparin  1,800 Units/hr (04/01/24 0700)   linezolid  (ZYVOX ) IV Stopped (03/31/24 2226)   PrismaSol  BGK 2/3.5 500 mL/hr at 04/01/24 0012   PrismaSol  BGK 2/3.5 400 mL/hr at 04/01/24 0320   prismasol  BGK 4/2.5 1,500 mL/hr at 04/01/24 9472   promethazine  (PHENERGAN ) injection (IM or IVPB) Stopped (03/18/24 0911)    PRN Medications: acetaminophen  (TYLENOL ) oral liquid 160 mg/5 mL, alum & mag hydroxide-simeth, diclofenac  Sodium, [COMPLETED] diphenoxylate -atropine  **FOLLOWED BY** diphenoxylate -atropine , docusate, glucagon  (human recombinant), heparin , heparin , heparin , HYDROmorphone  (DILAUDID ) injection, hydrOXYzine , lip balm, mouth rinse, mouth rinse, oxyCODONE , polyethylene glycol, promethazine  (PHENERGAN ) injection (IM or IVPB), simethicone , sodium chloride , sodium chloride  flush, traZODone   Patient Profile  Patient with longstanding history of chronic heart failure with preserved ejection fraction, RV failure with history of pulmonary embolism presents with esophageal perforation.  S/P Stent Esophageal Stent. Stent repositioned 10/15. AHF signed off 11/10. Re-consulted 03/22/24 with A/C HFpEF.  Assessment/Plan  1.  Esophageal Perforation - Boerhaave syndrome.  - CT C/A/P: with pneumomediastinum and large mass-like density in lower esophagus - 10/6: EGD with esophageal stent placed  - 10/15: Stent migrated w/ leak > back to OR for repositioning  - 10/24: barium  swallow w persistent leak  - 11/3: PEG placed - on TF and meds by PEG.  - 11/26: Stent removed. Esophagram (-) for esophageal leak - 12/2: EDG w/ ischemic ulcers. Carafate  increased + BID PPI - Remains NPO   2. Acute on chronic HFpEF - h/o cardiogenic shock with RV failure in the setting of cardiac arrest thought to be caused by acute PE vs ACS in 3/25. Echo at the time showed EF 55%, severe, LVH, G2DD, and severely reduced RV function with strain. There was concern for cardiac sarcoid amyloid, however CMR LGE consistent with ischemic disease. RV on echo 9/25 with complete recovery, EF 60-65%, nl RV function.    - RHC 3/25: Mild PAH with severe RV failure though CO is preserved.   -  Echo 10/25 EF 60-65%, RV normal.  - Echo 12/10: EF 60-65% with basal inferior akinesis, moderate LVH, normal RV size and mildly decreased systolic function.  - Off pressors and now on clevidipine  for HTN.   - Has had significant peripheral edema though CVP now < 5.  Weight down 1 lb and peripheral edema much improved.  Plan to continue CVVHD through today until filter clots or stop when he's going to IR. Will not restart after per nephrology, will follow for iHD needs.  - Co-ox 59%.   3. CAD:  - LHC 3/25: 3v CAD with CTO RCA with L->R collaterals and moderate non-obstructive CAD in L system. Medical management.  - suspect CP related to esophageal issues and not CAD - No chest pain.     4.  AKI on CKD Stage 3b: Baseline sCr ~ 2.1. - Worsening AKI> CRRT - L RAS. Continue CRRT  - Continue UF to 50 cc/hr. Once stopped will follow for iHD needs per nephrology.   5. PAF:   - s/p TEE/DCCV 3/25 - In atrial flutter this morning with rates 110s-120s. Will stop PO amio and switch to gtt + bolus.  - Continue heparin  gtt.   6.  H/o DVT/PE:  Bilateral upper and lower DVTs, mixed acute and chronic.  - s/p infrarenal IVC placement 3/25 - V/Q 3/25 with multiple perfusion defects - on Heparin  drip.    7. Carotid  stenosis: h/o CVA. TCAR in 6/24. Okay to remain off plavix  at this point. - carotid US  9/25: widely patent carotid stent on R and no stenosis on L   8. Acute Hypoxic Respiratory Failure/PNA/ Exudative Lt Pleural Effusion  - CT placed for pleural effusion. Resp Cx w/ rare yeast, BCx NGTD  - Re-intubated 12/10 per CCM.  - Extubated 12/12.   9. Severe protein calorie malnutrition - Nutrition following. PEG tube + TF  10. HTN - Clevidipine  IV - He has had problems with both hydralazine  and amlodipine .  Continue clonidine  for anxiety, should also help with BP.    11. Superficial LUE thrombus - Continue PICC - Continue heparin  gtt  12. Petechial Rash - With diffuse petechial rash, ANA+, anti-histone ab+, and low C4, there is concern for possible drug-induced lupus from hydralazine  with AKI.  ANCA negative.  - Discussed with nephrology and CCM, would ideally get a renal biopsy.  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  Not ideal with friable esophagus but tolerating so far.   13. Anemia - Hgb 11.2, had transfusion 12/12.   CRITICAL CARE Performed by: Beckey LITTIE Coe   Total critical care time: 14 minutes  Critical care time was exclusive of separately billable procedures and treating other patients.  Critical care was necessary to treat or prevent imminent or life-threatening deterioration.  Critical care was time spent personally by me on the following activities: development of treatment plan with patient and/or surrogate as well as nursing, discussions with consultants, evaluation of patient's response to treatment, examination of patient, obtaining history from patient or surrogate, ordering and performing treatments and interventions, ordering and review of laboratory studies, ordering and review of radiographic studies, pulse oximetry and re-evaluation of patient's  condition.   Beckey LITTIE Coe 04/01/2024 8:01 AM  Patient seen with NP, I formulated the plan and agree with the above note.   Patient is in atrial flutter today, rate in 80s after  starting amiodarone  gtt. He is still on clevidipine  7 mg/hr.   CVP < 5, CVVH running even.   He remains on Solumedrol.   General: NAD Neck: No JVD, no thyromegaly or thyroid  nodule.  Lungs: Clear to auscultation bilaterally with normal respiratory effort. CV: Nondisplaced PMI.  Heart regular S1/S2, no S3/S4, no murmur.  No peripheral edema.   Abdomen: Soft, nontender, no hepatosplenomegaly, no distention.  Skin: Petechial rash arms/legs, improving.   Neurologic: Alert and oriented x 3.  Psych: Normal affect. Extremities: No clubbing or cyanosis.  HEENT: Normal.   He remains on linezolid  for MRSA on BAL.   Volume status looks good this morning, CVP < 5.  Continue to run CVVH even and should be able to stop today.   See discussion above, concern for drug-induced lupus with AKI.  He has been on Solumedrol.   - Hopefully will get renal biopsy today with IR.   Continue IV amiodarone  30 mg/hr now that he is back in atrial flutter.  Continue heparin , can hold for biopsy.   CRITICAL CARE Performed by: Ezra Shuck  Total critical care time: 35 minutes  Critical care time was exclusive of separately billable procedures and treating other patients.  Critical care was necessary to treat or prevent imminent or life-threatening deterioration.  Critical care was time spent personally by me on the following activities: development of treatment plan with patient and/or surrogate as well as nursing, discussions with consultants, evaluation of patient's response to treatment, examination of patient, obtaining history from patient or surrogate, ordering and performing treatments and interventions, ordering and review of laboratory studies, ordering and review of radiographic studies, pulse oximetry and re-evaluation of  patient's condition.  Ezra Shuck 04/01/2024 9:32 AM

## 2024-04-01 NOTE — TOC Progression Note (Signed)
 Transition of Care [facility]) - Progression Note    Patient Details  Name: Billy [name] MRN: [id number] Date of Birth: November 27, 1950  [facility]) CM/SW Contact  Justina Delcia Czar, RN Phone Number: [phone] 04/01/2024, 1:18 PM  Clinical Narrative:     Patient remains on CRRT and amiodarone  gtt in ICU. HF CSW following for SNF placement once medically stable.   Chart reviewed for discharge readiness, patient not medically stable for d/c. Inpatient CM/CSW will continue to monitor pt's advancement through interdisciplinary progression rounds.   If new pt transition needs arise, MD please place a TOC consult.  Will consult with the care team regarding potential LTAC candidacy once the patient is medically improved.    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, SNF Pending bed offer               Expected Discharge Plan and Services In-house Referral: Clinical Social Work Discharge Planning Services: [employer] Consult Post Acute Care Choice: Skilled Nursing Facility Living arrangements for the past 2 months: Single Family Home                                       Social Drivers of Health (SDOH) Interventions SDOH Screenings   Food Insecurity: No Food Insecurity (01/22/2024)  Housing: High Risk (01/22/2024)  Transportation Needs: No Transportation Needs (01/22/2024)  Utilities: Not At Risk (01/22/2024)  Social Connections: Socially Isolated (01/22/2024)  Tobacco Use: Low Risk (03/19/2024)    Readmission Risk Interventions    06/27/2023    1:52 PM 10/14/2022   11:29 AM  Readmission Risk Prevention Plan  Post Dischage Appt  Complete  Medication Screening  Complete  Transportation Screening Complete Complete  HRI or Home Care Consult Complete   Social Work Consult for Recovery Care Planning/Counseling Complete   Palliative Care Screening Not Applicable   Medication Review Special Educational Needs Teacher) Referral to Pharmacy
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
## 2024-04-02 ENCOUNTER — Inpatient Hospital Stay (HOSPITAL_COMMUNITY)

## 2024-04-02 DIAGNOSIS — I160 Hypertensive urgency: Secondary | ICD-10-CM

## 2024-04-02 DIAGNOSIS — K223 Perforation of esophagus: Secondary | ICD-10-CM | POA: Diagnosis not present

## 2024-04-02 DIAGNOSIS — N179 Acute kidney failure, unspecified: Secondary | ICD-10-CM | POA: Diagnosis not present

## 2024-04-02 DIAGNOSIS — I5033 Acute on chronic diastolic (congestive) heart failure: Secondary | ICD-10-CM | POA: Diagnosis not present

## 2024-04-02 DIAGNOSIS — N183 Chronic kidney disease, stage 3 unspecified: Secondary | ICD-10-CM | POA: Diagnosis not present

## 2024-04-02 DIAGNOSIS — J9601 Acute respiratory failure with hypoxia: Secondary | ICD-10-CM | POA: Diagnosis not present

## 2024-04-02 DIAGNOSIS — J90 Pleural effusion, not elsewhere classified: Secondary | ICD-10-CM | POA: Diagnosis not present

## 2024-04-02 DIAGNOSIS — J15212 Pneumonia due to Methicillin resistant Staphylococcus aureus: Secondary | ICD-10-CM | POA: Diagnosis not present

## 2024-04-02 DIAGNOSIS — K529 Noninfective gastroenteritis and colitis, unspecified: Secondary | ICD-10-CM | POA: Diagnosis not present

## 2024-04-02 LAB — RENAL FUNCTION PANEL
Albumin: 1.6 g/dL — ABNORMAL LOW (ref 3.5–5.0)
Anion gap: 8 (ref 5–15)
BUN: 26 mg/dL — ABNORMAL HIGH (ref 8–23)
CO2: 25 mmol/L (ref 22–32)
Calcium: 7.2 mg/dL — ABNORMAL LOW (ref 8.9–10.3)
Chloride: 97 mmol/L — ABNORMAL LOW (ref 98–111)
Creatinine, Ser: 1.13 mg/dL (ref 0.61–1.24)
GFR, Estimated: >60 mL/min (ref >60)
Glucose, Bld: 213 mg/dL — ABNORMAL HIGH (ref 70–99)
Phosphorus: 2.5 mg/dL (ref 2.5–4.6)
Potassium: 4.4 mmol/L (ref 3.5–5.1)
Sodium: 130 mmol/L — ABNORMAL LOW (ref 135–145)

## 2024-04-02 LAB — COOXEMETRY PANEL
Carboxyhemoglobin: 1.6 % — ABNORMAL HIGH (ref 0.5–1.5)
Methemoglobin: <0.7 % (ref 0.0–1.5)
O2 Saturation: 66.6 %
Total hemoglobin: 9 g/dL — ABNORMAL LOW (ref 12.0–16.0)

## 2024-04-02 LAB — CBC
HCT: 27.6 % — ABNORMAL LOW (ref 39.0–52.0)
Hemoglobin: 9 g/dL — ABNORMAL LOW (ref 13.0–17.0)
MCH: 27.8 pg (ref 26.0–34.0)
MCHC: 32.6 g/dL (ref 30.0–36.0)
MCV: 85.2 fL (ref 80.0–100.0)
Platelets: 229 K/uL (ref 150–400)
RBC: 3.24 MIL/uL — ABNORMAL LOW (ref 4.22–5.81)
RDW: 16 % — ABNORMAL HIGH (ref 11.5–15.5)
WBC: 16.3 K/uL — ABNORMAL HIGH (ref 4.0–10.5)
nRBC: 0 % (ref 0.0–0.2)

## 2024-04-02 LAB — MAGNESIUM: Magnesium: 2.4 mg/dL (ref 1.7–2.4)

## 2024-04-02 LAB — HEPARIN LEVEL (UNFRACTIONATED): Heparin Unfractionated: <0.1 [IU]/mL — ABNORMAL LOW (ref 0.30–0.70)

## 2024-04-02 LAB — GLUCOSE, CAPILLARY
Glucose-Capillary: 90 mg/dL (ref 70–99)
Glucose-Capillary: 74 mg/dL (ref 70–99)
Glucose-Capillary: 109 mg/dL — ABNORMAL HIGH (ref 70–99)
Glucose-Capillary: 212 mg/dL — ABNORMAL HIGH (ref 70–99)
Glucose-Capillary: 193 mg/dL — ABNORMAL HIGH (ref 70–99)
Glucose-Capillary: 219 mg/dL — ABNORMAL HIGH (ref 70–99)

## 2024-04-02 LAB — PROTIME-INR
INR: 1.2 (ref 0.8–1.2)
Prothrombin Time: 15.4 s — ABNORMAL HIGH (ref 11.4–15.2)

## 2024-04-02 MED ORDER — MINOXIDIL 10 MG PO TABS
10.0000 mg | ORAL_TABLET | Freq: Every day | ORAL | Status: DC
  Administered 2024-04-02 – 2024-04-08 (×7): 10 mg
  Filled 2024-04-02 (×7): qty 1

## 2024-04-02 MED ORDER — OXIDIZED CELLULOSE EX PADS
1.0000 | MEDICATED_PAD | Freq: Once | CUTANEOUS | Status: DC
  Filled 2024-04-02: qty 1

## 2024-04-02 MED ORDER — LIDOCAINE HCL (PF) 1 % IJ SOLN
10.0000 mL | Freq: Once | INTRAMUSCULAR | Status: AC
  Administered 2024-04-02: 12:00:00 10 mL

## 2024-04-02 MED ORDER — HYDROMORPHONE HCL 1 MG/ML IJ SOLN
1.0000 mg | Freq: Once | INTRAMUSCULAR | Status: AC
  Administered 2024-04-02: 18:00:00 1 mg via INTRAVENOUS
  Filled 2024-04-02: qty 1

## 2024-04-02 MED ORDER — FENTANYL CITRATE (PF) 100 MCG/2ML IJ SOLN
INTRAMUSCULAR | Status: AC
  Filled 2024-04-02: qty 2

## 2024-04-02 MED ORDER — HYDROXYZINE HCL 25 MG PO TABS
25.0000 mg | ORAL_TABLET | Freq: Three times a day (TID) | ORAL | Status: DC | PRN
  Administered 2024-04-02 – 2024-04-04 (×3): 25 mg
  Filled 2024-04-02 (×3): qty 1

## 2024-04-02 MED ORDER — OXYCODONE HCL 5 MG PO TABS
10.0000 mg | ORAL_TABLET | ORAL | Status: DC | PRN
  Administered 2024-04-02 – 2024-04-07 (×12): 10 mg
  Filled 2024-04-02 (×13): qty 2

## 2024-04-02 MED ORDER — MIDAZOLAM HCL (PF) 2 MG/2ML IJ SOLN
INTRAMUSCULAR | Status: AC | PRN
  Administered 2024-04-02: 11:00:00 1 mg via INTRAVENOUS

## 2024-04-02 MED ORDER — HEPARIN (PORCINE) 25000 UT/250ML-% IV SOLN: 1800.0000 [IU]/h | INTRAVENOUS | Status: DC

## 2024-04-02 MED ORDER — FENTANYL CITRATE (PF) 100 MCG/2ML IJ SOLN
INTRAMUSCULAR | Status: AC | PRN
  Administered 2024-04-02 (×2): 25 ug via INTRAVENOUS

## 2024-04-02 MED ORDER — HYDROMORPHONE HCL 1 MG/ML IJ SOLN
0.5000 mg | INTRAMUSCULAR | Status: DC | PRN
  Administered 2024-04-02 – 2024-04-18 (×66): 0.5 mg via INTRAVENOUS
  Filled 2024-04-02 (×6): qty 0.5
  Filled 2024-04-02: qty 1
  Filled 2024-04-02 (×2): qty 0.5
  Filled 2024-04-02: qty 1
  Filled 2024-04-02 (×2): qty 0.5
  Filled 2024-04-02: qty 1
  Filled 2024-04-02: qty 0.5
  Filled 2024-04-02: qty 1
  Filled 2024-04-02 (×4): qty 0.5
  Filled 2024-04-02: qty 1
  Filled 2024-04-02 (×2): qty 0.5
  Filled 2024-04-02: qty 1
  Filled 2024-04-02 (×7): qty 0.5
  Filled 2024-04-02: qty 1
  Filled 2024-04-02: qty 0.5
  Filled 2024-04-02: qty 1
  Filled 2024-04-02: qty 0.5
  Filled 2024-04-02: qty 1
  Filled 2024-04-02 (×3): qty 0.5
  Filled 2024-04-02: qty 1
  Filled 2024-04-02 (×12): qty 0.5

## 2024-04-02 MED ORDER — MIDAZOLAM HCL 2 MG/2ML IJ SOLN
INTRAMUSCULAR | Status: AC
  Filled 2024-04-02: qty 2

## 2024-04-02 NOTE — Consult Note (Addendum)
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 Trace hiatal hernia. LIVER: The liver is unremarkable. GALLBLADDER AND BILE DUCTS: Gallbladder is unremarkable. No biliary ductal dilatation. SPLEEN: No acute abnormality. PANCREAS: No acute abnormality. ADRENAL GLANDS: No acute abnormality. KIDNEYS, URETERS AND BLADDER: No stones in the kidneys or ureters. No hydronephrosis. No perinephric or periureteral stranding. Urinary bladder is unremarkable. GI AND BOWEL: Stomach demonstrates no acute abnormality. Gastrostomy tube in appropriate position. Persistent circumferential bowel thickening of the second, third, fourth portion of the duodenum. Associated surrounding fat stranding. Terminal ileum bowel wall thickening circumferentially. Associated fat stranding along the terminal ileum. No small bowel dilatation. No pneumatosis. PO contrast reaches the colon. No large bowel thickening or dilatation. The appendix is unremarkable. PERITONEUM AND RETROPERITONEUM: Interval increase in small volume simple free fluid consistent with ascites. No free air. VASCULATURE: Aorta is normal in caliber. Severe atherosclerotic plaque of the abdominal aorta. IVC filter in appropriate position. LYMPH NODES: No  lymphadenopathy. REPRODUCTIVE ORGANS: No acute abnormality. BONES AND SOFT TISSUES: Degenerative changes of the spine. Posterior disc osteophyte complex formation at the L4-L5 level. No acute osseous abnormality. No focal soft tissue abnormality. IMPRESSION: 1. Persistent duodenitis and interval development of terminal ileitis. No findings of bowel obstruction or perforation. 2. Interval increase in small-volume simple ascites. 3. Limited evaluation on this noncontrast study. Electronically signed by: Morgane Naveau MD 03/24/2024 08:25 PM EST RP Workstation: [id number]   DG Chest Port 1 View Result Date: 03/22/2024 EXAM: 1 VIEW(S) XRAY OF THE CHEST 03/22/2024 12:10:00 PM COMPARISON: 03/21/2024 CLINICAL HISTORY: Respiratory failure (HCC) FINDINGS: LINES, TUBES AND DEVICES: Right upper extremity PICC in place with tip in mid superior vena cava. Implanted cardiac monitoring device in left chest. LUNGS AND PLEURA: Worsening patchy airspace opacities in right lung and right pleural effusion. Patchy airspace opacities in left lung base. No pneumothorax. HEART AND MEDIASTINUM: Aortic arch atherosclerosis. BONES AND SOFT TISSUES: No acute osseous abnormality. IMPRESSION: 1. Worsening patchy airspace opacities in the right lung and right pleural effusion is most concerning for infection. Edema  could have a similar appearance . 2. Patchy airspace opacities in the left lung base are stable . 3. Stable small left pleural effusion. Electronically signed by: Lonni Necessary MD 03/22/2024 04:42 PM EST RP Workstation: [id number]   DG CHEST PORT 1 VIEW Result Date: 03/21/2024 CLINICAL DATA:  Shortness of breath. EXAM: PORTABLE CHEST 1 VIEW COMPARISON:  Radiograph 03/09/2024, chest CT 03/15/2024 FINDINGS: Stable positioning of right upper extremity PICC. Stable heart size and mediastinal contours. Bilateral pleural effusions. Patchy opacity in the right mid, upper and lower lung zone as well as left lung base has worsened  from prior radiograph. No pneumothorax. Left chest wall loop recorder in place. IMPRESSION: Bilateral pleural effusions. Patchy opacity in the right mid, upper and lower lung zone as well as left lung base has worsened from prior radiograph, suspicious for pneumonia. Electronically Signed   By: Andrea Gasman M.D.   On: 03/21/2024 14:56   CT ABDOMEN PELVIS W CONTRAST Result Date: 03/18/2024 CLINICAL DATA:  Provided history: Abdominal pain, acute (Ped 0-17y) EXAM: CT ABDOMEN AND PELVIS WITH CONTRAST TECHNIQUE: Multidetector CT imaging of the abdomen and pelvis was performed using the standard protocol following bolus administration of intravenous contrast. RADIATION DOSE REDUCTION: This exam was performed according to the departmental dose-optimization program which includes automated exposure control, adjustment of the mA and/or kV according to patient size and/or use of iterative reconstruction technique. CONTRAST:  75mL OMNIPAQUE  IOHEXOL  350 MG/ML SOLN COMPARISON:  CT 3 days ago 03/15/2024 FINDINGS: Lower chest: Small bilateral pleural effusions, minimally diminished from recent prior. Chronic lung disease in the bases. Please reference recent chest CT for complete thoracic assessment. Pocket of gas adjacent to the left side of the mid/distal esophagus is only partially included in the field of view, possibly diminished from recent prior. Hepatobiliary: No focal liver abnormality. Unremarkable appearance of the gallbladder. No biliary dilatation. Pancreas: No evidence of pancreatic inflammation. Assessment of the pancreatic head is again limited due to adjacent duodenal wall thickening. Spleen: Normal in size without focal abnormality. Adrenals/Urinary Tract: Normal adrenal glands. Chronic left renal atrophy. No hydronephrosis or focal renal abnormality of the right kidney. Unremarkable appearance of the urinary bladder. Stomach/Bowel: Gastrostomy tube in the stomach. Wall thickening of the distal esophagus  which contains a small amount of enteric contrast. The stomach is physiologically distended. There is severe circumferential wall thickening about the transverse duodenum and proximal jejunum. No obstruction with enteric contrast reaching the colon. No new areas of small bowel wall thickening. The appendix is normal. No colonic inflammatory change. No bowel pneumatosis. Vascular/Lymphatic: Aortic atherosclerosis. Infrarenal IVC filter in place. Small upper abdominal lymph nodes are not enlarged by size criteria. Reproductive: Prostate is unremarkable. Other: Edema in the upper abdomen adjacent to proximal small bowel wall thickening. Small amount of free fluid adjacent to the liver, increased. Small amount of free fluid in the right pericolic gutter tracking into the pelvis. No free air or focal fluid collection. Patulous right inguinal canal. Generalized subcutaneous edema. Musculoskeletal: Stable degenerative change in the spine. IMPRESSION: 1. Persistent severe circumferential wall thickening about the duodenum and proximal jejunum, suspicious for enteritis. No obstruction with enteric contrast reaching the colon. 2. Small amount of free fluid in the abdomen and pelvis, increased over the last 3 days. 3. Small bilateral pleural effusions, minimally diminished. 4. Contrast in the distal esophagus can be seen in the setting of reflux. The small pocket of gas adjacent to the distal esophagus is only partially included in the field of view,  possibly diminished from recent prior. Aortic Atherosclerosis (ICD10-I70.0). Electronically Signed   By: Andrea Gasman M.D.   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 Status post stent placement and removal. He presents for water -soluble UGI due to abdominal pain and emesis. CT on 03/15/24 demonstrated wall thickening involving the duodenum and proximal jejunum.  EXAM: DG UGI W SINGLE CM TECHNIQUE: Scout radiograph was obtained. Single contrast examination was performed using 200 mL of water -soluble contrast. 150 mL of the water -soluble contrast was given orally, 50 mL of the water -soluble contrast was given via the G-tube. The G tube was flushed with 10 mL of water  at the end of the exam. This exam was performed by Toya Cousin, PA-C, and was supervised and interpreted by [name] Balder, MD. FLUOROSCOPY: Radiation Exposure Index (as provided by the fluoroscopic device): 39.5 mGy Kerma COMPARISON:  CT chest abdomen without contrast on 03/15/2024, water -soluble esophagram on 02/09/2024, water  soluble esophagram on 01/31/2024, water -soluble esophagus on 01/23/2024 FINDINGS: Scout Radiograph: Scout image demonstrates an IVC filter and gastrostomy tube. Nonobstructive bowel gas pattern with gas in the colon. Central line tip in the SVC. Esophagus: Esophagus is patent with mild narrowing and irregularity in the mid esophagus. No evidence for an esophageal perforation. Mild dilatation of the distal esophagus with small amount of debris or filling defects in the distal esophagus near the GE junction. Esophageal motility: Esophageal motility was not evaluated formally. Proximal escape seen. Gastroesophageal reflux:  Not examined. Ingested 13mm barium tablet:  Not given. Stomach: Gastrostomy tube in place. Normal appearance of the stomach. Gastric emptying: Normal. Duodenum: Circumferential narrowing in the distal duodenum and proximal jejunum. Findings correspond with recent CT findings. Other:  Mildly dilated loops of proximal jejunum. IMPRESSION: 1. Esophagus is patent and no evidence for a perforation. 2. Mild irregularity in the mid esophagus possibly related to previous stent. 3. Diffuse narrowing of the lumen in the distal duodenum and proximal jejunum. Findings are suggestive for underlying wall thickening in this area. Distribution of disease is similar to the prior CT from  03/15/2024. Electronically Signed   By: [name] Balder M.D.   On: 03/18/2024 16:52   DG Abd 1 View Result Date: 03/18/2024 EXAM: 1 VIEW XRAY OF THE ABDOMEN 03/18/2024 07:43:00 AM COMPARISON: 03/15/2024 CLINICAL HISTORY: Abdominal pain FINDINGS: LINES, TUBES AND DEVICES: Percutaneous gastrostomy tube in place. Unchanged IVC filter in place. BOWEL: Nonobstructive bowel gas pattern. SOFT TISSUES: No opaque urinary calculi. BONES: No acute osseous abnormality. IMPRESSION: 1. No acute findings. 2. Percutaneous gastrostomy tube in expected position. 3. Unchanged inferior vena cava filter in place. Electronically signed by: Waddell Calk MD 03/18/2024 08:06 AM EST RP Workstation: [id number]   CT CHEST ABDOMEN PELVIS WO CONTRAST Result Date: 03/15/2024 CLINICAL DATA:  Chest pain, nonspecific. Emesis. History of esophageal tear. EXAM: CT CHEST, ABDOMEN AND PELVIS WITHOUT CONTRAST TECHNIQUE: Multidetector CT imaging of the chest, abdomen and pelvis was performed following the standard protocol without IV contrast. RADIATION DOSE REDUCTION: This exam was performed according to the departmental dose-optimization program which includes automated exposure control, adjustment of the mA and/or kV according to patient size and/or use of iterative reconstruction technique. COMPARISON:  CT chest 03/10/2024 and 01/21/2024 and chest CT 06/29/2017 FINDINGS: CT CHEST FINDINGS Cardiovascular: Atherosclerotic calcifications involving the thoracic aorta. Coronary artery calcifications. Heart size is within normal limits. Minimal pericardial fluid. Right arm PICC line with the tip in  the SVC. Mediastinum/Nodes: Esophageal stent has been removed in the interim. Again noted is pocket of gas along the left side of the mid/distal esophagus. Limited evaluation of the esophagus without IV or oral contrast. No new mediastinal air. Surgical clips on the left side of the neck. No significant chest lymphadenopathy. Lungs/Pleura: Again noted are  small bilateral pleural effusions that have not significantly changed.Patchy parenchymal densities in the right middle lobe and right lower lobe are again noted. Findings are concerning for areas of atelectasis and infection. Again noted are multiple nodular densities throughout the right lung. Poorly defined nodular density in the right lower lobe on image 105, sequence 5 measures 9 mm. Small nodular densities in the right lower lobe superior segment. Largest nodule in the right upper lobe measures 8 mm on image 69/5. These nodular densities are new since 2019. Small amount of fluid along the left major fissure. Volume loss in left lower lobe. Musculoskeletal: No acute bone abnormality in the chest. Prior sternal fracture. CT ABDOMEN PELVIS FINDINGS Hepatobiliary: Normal appearance of the liver and gallbladder. No significant biliary dilatation. Pancreas: No acute abnormality involving the pancreas but limited evaluation of the pancreatic head due to extensive wall thickening inflammation involving the duodenum. Spleen: Normal in size without focal abnormality. Adrenals/Urinary Tract: Normal adrenal glands. Atrophic left kidney. Normal appearance of the right kidney without hydronephrosis. Normal appearance of the urinary bladder. Stomach/Bowel: Oral contrast in the colon. Gastrostomy tube is in place. Diffuse thickening of the transverse segment of the duodenum and proximal jejunum. This represents a marked change since 01/21/2024. Distal small bowel is decompressed. Question mild wall thickening in the terminal ileum. No evidence for bowel obstruction. Vascular/Lymphatic: IVC filter. Extensive atherosclerotic disease in the abdominal aorta without aneurysm. No significant lymph node enlargement in the abdomen or pelvis. Reproductive: Prostate is unremarkable. Other: Negative for ascites. Negative for free air. Small umbilical hernia containing fat. Small amount of presacral edema. Musculoskeletal: Disc space  narrowing at L4-L5. Multilevel degenerative changes in lumbar spine. No acute bone abnormality. IMPRESSION: 1. Severe wall thickening involving the duodenum and proximal jejunum. These findings are new and concerning for severe enteritis. Etiology is uncertain. 2. Interval removal of the esophageal stent. Again noted is a pocket of gas along the left side of the mid/distal esophagus. No new mediastinal air. 3. Persistent small bilateral pleural effusions. 4. Patchy parenchymal densities in the right middle lobe and right lower lobe are concerning for areas of atelectasis and infection. 5. Multiple nodular densities in the right lung. These are new since 2019 and will need follow-up to exclude a neoplastic process. 6. Question mild wall thickening in the terminal ileum. 7. Atrophic left kidney. 8.  Aortic Atherosclerosis (ICD10-I70.0). Electronically Signed   By: [name] Balder M.D.   On: 03/15/2024 09:09   DG Abd 1 View Result Date: 03/15/2024 EXAM: 1 VIEW XRAY OF THE ABDOMEN 03/15/2024 05:59:00 AM COMPARISON: KUB dated 03/12/2024. CLINICAL HISTORY: Abdominal pain. FINDINGS: LINES, TUBES AND DEVICES: A gastrostomy tube is now present and appears to be in an expected position. An IVC filter is also again noted. LUNGS: There are coarse opacities present within the lung bases. BOWEL: The abdominal bowel gas pattern is unremarkable. SOFT TISSUES: No opaque urinary calculi. BONES: No acute osseous abnormality. IMPRESSION: 1. No acute findings. 2. Gastrostomy tube in expected position. 3. IVC filter in situ. Electronically signed by: Evalene Coho MD 03/15/2024 06:04 AM EST RP Workstation: [id number]   DG C-Arm 1-60 Min Result Date: 03/13/2024 CLINICAL  DATA:  Upper endoscopy with stent removal EXAM: DG C-ARM 1-60 MIN COMPARISON:  03/10/2024 FINDINGS: A single fluoroscopic image was obtained during the performance of the procedure and is provided for interpretation only. Images demonstrate opacification of the  distal thoracic esophagus. The previously identified radiopaque stent is not visualized. Please refer to operative report. Fluoroscopy time: 40 seconds, 5.10 mGy IMPRESSION: 1. Intraoperative exam as above. Please refer to the operative report. Electronically Signed   By: Ozell Daring M.D.   On: 03/13/2024 20:40   DG ABDOMEN PEG TUBE LOCATION Result Date: 03/12/2024 CLINICAL DATA:  Peg tube placement. EXAM: ABDOMEN - 1 VIEW COMPARISON:  01/31/2024 FINDINGS: Interval oral contrast in the stomach, duodenum and proximal jejunum. No contrast extravasation seen. The PEG tube is not visualized. Stable inferior vena cava filter. Normal bowel-gas pattern. Lumbar spine degenerative changes. IMPRESSION: PEG tube in the stomach with no contrast extravasation seen. Electronically Signed   By: Elspeth Bathe M.D.   On: 03/12/2024 15:39   CT CHEST WO CONTRAST Result Date: 03/10/2024 CLINICAL DATA:  Esophageal mass. EXAM: CT CHEST WITHOUT CONTRAST TECHNIQUE: Multidetector CT imaging of the chest was performed following the standard protocol without IV contrast. RADIATION DOSE REDUCTION: This exam was performed according to the departmental dose-optimization program which includes automated exposure control, adjustment of the mA and/or kV according to patient size and/or use of iterative reconstruction technique. COMPARISON:  Chest CT dated 01/21/2024. FINDINGS: Evaluation of this exam is limited in the absence of intravenous contrast. Cardiovascular: Mild cardiomegaly. No pericardial effusion. There is coronary vascular calcification of the LAD. Mild atherosclerotic calcification of the thoracic aorta. No intimal dilatation. The central pulmonary arteries are grossly unremarkable. Right-sided PICC with tip in the central SVC. Mediastinum/Nodes: No obvious hilar adenopathy. A cluster of small rounded nodules/lymph nodes in the aortopulmonic window. An esophageal stent is noted. The lumen of the stent is filled with content  with similar attenuation as the esophagus. No mediastinal fluid collection. Lungs/Pleura: Small bilateral pleural effusions. Bibasilar linear and streaky atelectasis. Pneumonia is not excluded. Small fluid noted along the left fissure. Scattered clusters of nodular density predominantly in the right upper and right lower lobes with the largest nodule measuring 11 mm (72/5) as seen previously. Findings may be inflammatory/infectious in etiology or related to aspiration. Metastatic disease is not excluded. No pneumothorax. The central airways are patent. Upper Abdomen: Percutaneous gastrostomy with balloon in the body of the stomach. Musculoskeletal: No acute osseous pathology. IMPRESSION: 1. Esophageal stent in place.  No mediastinal fluid collection. 2. Small bilateral pleural effusions with bibasilar atelectasis. Pneumonia is not excluded. 3. Scattered clusters of nodular density predominantly in the right upper and right lower lobes with the largest nodule measuring 11 mm as seen previously. Findings may be inflammatory/infectious in etiology or related to aspiration. Metastatic disease is not excluded. 4.  Aortic Atherosclerosis (ICD10-I70.0). Electronically Signed   By: Vanetta Chou M.D.   On: 03/10/2024 11:58   DG CHEST PORT 1 VIEW Result Date: 03/09/2024 EXAM: 1 VIEW(S) XRAY OF THE CHEST 03/09/2024 06:46:17 PM COMPARISON: None available. CLINICAL HISTORY: PNA (pneumonia). FINDINGS: LINES, TUBES AND DEVICES: The loop recorder is present. Esophageal stent along the midline. Right PICC line with tip over the cavoatrial junction region. LUNGS AND PLEURA: Patchy coarse infiltrates in the lung bases could represent pneumonia or aspiration. Small bilateral pleural effusions. No pneumothorax. HEART AND MEDIASTINUM: Mild cardiac enlargement. Calcification of the aorta. BONES AND SOFT TISSUES: No acute osseous abnormality. IMPRESSION: 1. Patchy coarse  bibasilar airspace opacities, which may represent pneumonia  or aspiration. 2. Small bilateral pleural effusions. 3. No pneumothorax. Electronically signed by: Elsie Gravely MD 03/09/2024 07:02 PM EST RP Workstation: [id number]   VAS US  UPPER EXTREMITY VENOUS DUPLEX Result Date: 03/07/2024 UPPER VENOUS STUDY  Patient Name:  Boruch Ahmad  Date of Exam:   03/07/2024 Medical Rec #: [id number]   Accession #:    [id number] Date of Birth: 05-Jan-1951   Patient Gender: M Patient Age:   38 years Exam Location:  [facility] Procedure:      VAS US  UPPER EXTREMITY VENOUS DUPLEX Referring Phys: REDIA CLEAVER --------------------------------------------------------------------------------  Indications: Edema, and PICC line. Other Indications: H.O DVT. Comparison Study: Previous study on 3.18.2025. Performing Technologist: Edilia Elden Appl  Examination Guidelines: A complete evaluation includes B-mode imaging, spectral Doppler, color Doppler, and power Doppler as needed of all accessible portions of each vessel. Bilateral testing is considered an integral part of a complete examination. Limited examinations for reoccurring indications may be performed as noted.  Right Findings: +----------+------------+---------+-----------+----------+--------------+ RIGHT     CompressiblePhasicitySpontaneousProperties   Summary     +----------+------------+---------+-----------+----------+--------------+ IJV           Full       Yes       Yes                             +----------+------------+---------+-----------+----------+--------------+ Subclavian               Yes       Yes                             +----------+------------+---------+-----------+----------+--------------+ Axillary      Full       Yes       Yes                 IV line.    +----------+------------+---------+-----------+----------+--------------+ Brachial      Full       Yes       Yes                 IV line.     +----------+------------+---------+-----------+----------+--------------+ Radial        Full                                                 +----------+------------+---------+-----------+----------+--------------+ Ulnar                                               Not visualized +----------+------------+---------+-----------+----------+--------------+ Cephalic      None       No        No                              +----------+------------+---------+-----------+----------+--------------+ Basilic       None       No        No                              +----------+------------+---------+-----------+----------+--------------+  Superficial vein thrombosis noted the basilic vein from the distal arm to the antecubital fossa and in the cephalic vein from the antecubital fossa to the distal forearm. Right middle brachial veins were not visualized due to IV bandages, all other segments are compressible.  Left Findings: +----------+------------+---------+-----------+----------+-------+ LEFT      CompressiblePhasicitySpontaneousPropertiesSummary +----------+------------+---------+-----------+----------+-------+ IJV           Full       Yes       Yes                      +----------+------------+---------+-----------+----------+-------+ Subclavian               Yes       Yes                      +----------+------------+---------+-----------+----------+-------+  Summary:  Right: Findings consistent with acute superficial vein thrombosis involving the right basilic vein and right cephalic vein.  Left: No evidence of thrombosis in the subclavian.  *See table(s) above for measurements and observations.  Diagnosing physician: Debby Robertson Electronically signed by Debby Robertson on 03/07/2024 at 8:00:10 PM.    Final     Labs:  CBC: Recent Labs    03/30/24 0420 03/31/24 0552 04/01/24 0428 04/02/24 0425  WBC 21.2* 26.2* 22.9* 16.3*  HGB 9.7* 11.3* 11.2* 9.0*  HCT  27.4* 34.6* 30.3* 27.6*  PLT 301 337 268 229    COAGS: Recent Labs    07/02/23 0136 01/21/24 1728 01/23/24 1931 02/05/24 0408 02/05/24 0800 03/29/24 1952 03/30/24 0420 04/01/24 0426 04/01/24 0650 04/02/24 0423  INR 1.3* 1.3*  --  1.8*  --   --   --   --   --  1.2  APTT  --   --    < >  --    < > 35 68* 180* 107*  --    < > = values in this interval not displayed.    BMP: Recent Labs    03/31/24 1550 04/01/24 0428 04/01/24 1527 04/02/24 0425  NA 133* 132* 134* 130*  K 4.9 4.4 4.8 4.4  CL 101 99 103 97*  CO2 25 25 25 25   GLUCOSE 198* 95 161* 213*  BUN 32* 28* 29* 26*  CALCIUM  7.5* 7.4* 7.3* 7.2*  CREATININE 1.27* 1.09 1.29* 1.13  GFRNONAA >60 >60 59* >60    LIVER FUNCTION TESTS: Recent Labs    03/21/24 0345 03/24/24 0508 03/25/24 0535 03/26/24 0809 03/27/24 0500 03/27/24 1535 03/31/24 1550 04/01/24 0428 04/01/24 1527 04/02/24 0425  BILITOT 0.5  --  0.5 0.6 0.8  --   --   --   --   --   AST 31  --  54* 50* 59*  --   --   --   --   --   ALT 68*  --  95* 70* 69*  --   --   --   --   --   ALKPHOS 63  --  80 73 81  --   --   --   --   --   PROT 4.0*  --  4.1* 4.2* 4.7*  --   --   --   --   --   ALBUMIN  <1.5*   < > <1.5* 1.5* 2.0*   < > 1.8* 1.8* 1.7* 1.6*   < > = values in this interval not displayed.    TUMOR MARKERS: No results for input(s):  AFPTM, CEA, CA199, CHROMGRNA in the last 8760 hours.  Assessment and Plan:  Patient to undergo random renal biopsy in IR today. No contraindications for procedure identified in ROS, physical exam, or review of pre-sedation considerations. Labs reviewed and within acceptable range. INR today 1.2.  BP being managed by Cleviprex  03/26/24 US  renal imaging available and reviewed VSS, afebrile Heparin  has been held Abx not indicated   Risks and benefits of random renal biopsy was discussed with the patient and/or patient's family including, but not limited to bleeding, infection, damage to adjacent  structures or low yield requiring additional tests. 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   All of the questions were answered and there is agreement to proceed.  Consent signed and in chart.     Thank you for allowing our service to participate in Devarious Pavek 's care.    Electronically Signed: Laymon Coast, NP   04/02/2024, 8:38 AM     I spent a total of 20 Minutes    in face to face in clinical consultation, greater than 50% of which was counseling/coordinating care for image guided    (A copy of this note was sent to the referring provider and the time of visit.)
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
## 2024-04-02 NOTE — Procedures (Signed)
 Interventional Radiology Procedure:   Indications: Acute on chronic kidney disease  Procedure: US  guided right renal biopsy  Findings: Right kidney has an atypical orientation.  2 core biopsies from right kidney upper pole, gelfoam slurry injected along biopsy tract.  No evidence for bleeding or hematoma formation after biopsies.   Complications: None     EBL: Minimal  Plan:  Strict bedrest 4 hours   Leighann Amadon R. Philip, MD  Pager: [phone]

## 2024-04-02 NOTE — Progress Notes (Signed)
 OT Cancellation Note  Patient Details Name: Billy [name] MRN: [id number] DOB: 11-May-1950   Cancelled Treatment:    Reason Eval/Treat Not Completed: Other (comment) (Per RN, IR wanting PT/OT to hold, bedrest x4 hours. Will follow up next date as schedule permits.)  Jamela Cumbo K, OTD, OTR/L [facility] [phone]   Laneta MARLA Pereyra 04/02/2024, 12:08 PM
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
## 2024-04-02 NOTE — Plan of Care (Signed)
  Problem: Education: Goal: Knowledge of General Education information will improve Description: Including pain rating scale, medication(s)/side effects and non-pharmacologic comfort measures Outcome: Progressing   Problem: Health Behavior/Discharge Planning: Goal: Ability to manage health-related needs will improve Outcome: Progressing   Problem: Clinical Measurements: Goal: Will remain free from infection Outcome: Progressing Goal: Diagnostic test results will improve Outcome: Progressing Goal: Respiratory complications will improve Outcome: Progressing Goal: Cardiovascular complication will be avoided Outcome: Progressing   Problem: Activity: Goal: Risk for activity intolerance will decrease Outcome: Progressing   Problem: Nutrition: Goal: Adequate nutrition will be maintained Outcome: Progressing   Problem: Coping: Goal: Level of anxiety will decrease Outcome: Progressing   Problem: Elimination: Goal: Will not experience complications related to bowel motility Outcome: Progressing Goal: Will not experience complications related to urinary retention Outcome: Progressing   Problem: Pain Managment: Goal: General experience of comfort will improve and/or be controlled Outcome: Progressing   Problem: Safety: Goal: Ability to remain free from injury will improve Outcome: Progressing   Problem: Skin Integrity: Goal: Risk for impaired skin integrity will decrease Outcome: Progressing   Problem: Education: Goal: Ability to describe self-care measures that may prevent or decrease complications (Diabetes Survival Skills Education) will improve Outcome: Progressing   Problem: Coping: Goal: Ability to adjust to condition or change in health will improve Outcome: Progressing   Problem: Fluid Volume: Goal: Ability to maintain a balanced intake and output will improve Outcome: Progressing   Problem: Health Behavior/Discharge Planning: Goal: Ability to identify and  utilize available resources and services will improve Outcome: Progressing Goal: Ability to manage health-related needs will improve Outcome: Progressing   Problem: Metabolic: Goal: Ability to maintain appropriate glucose levels will improve Outcome: Progressing   Problem: Nutritional: Goal: Maintenance of adequate nutrition will improve Outcome: Progressing Goal: Progress toward achieving an optimal weight will improve Outcome: Progressing   Problem: Skin Integrity: Goal: Risk for impaired skin integrity will decrease Outcome: Progressing   Problem: Tissue Perfusion: Goal: Adequacy of tissue perfusion will improve Outcome: Progressing   Problem: Activity: Goal: Ability to tolerate increased activity will improve Outcome: Progressing   Problem: Respiratory: Goal: Ability to maintain a clear airway and adequate ventilation will improve Outcome: Progressing   Problem: Role Relationship: Goal: Method of communication will improve Outcome: Progressing   Problem: Education: Goal: Ability to describe self-care measures that may prevent or decrease complications (Diabetes Survival Skills Education) will improve Outcome: Progressing Goal: Individualized Educational Video(s) Outcome: Progressing   Problem: Coping: Goal: Ability to adjust to condition or change in health will improve Outcome: Progressing   Problem: Fluid Volume: Goal: Ability to maintain a balanced intake and output will improve Outcome: Progressing   Problem: Health Behavior/Discharge Planning: Goal: Ability to identify and utilize available resources and services will improve Outcome: Progressing Goal: Ability to manage health-related needs will improve Outcome: Progressing   Problem: Metabolic: Goal: Ability to maintain appropriate glucose levels will improve Outcome: Progressing   Problem: Nutritional: Goal: Maintenance of adequate nutrition will improve Outcome: Progressing Goal: Progress  toward achieving an optimal weight will improve Outcome: Progressing   Problem: Skin Integrity: Goal: Risk for impaired skin integrity will decrease Outcome: Progressing   Problem: Tissue Perfusion: Goal: Adequacy of tissue perfusion will improve Outcome: Progressing

## 2024-04-02 NOTE — Progress Notes (Signed)
 [name] Progress Note   Assessment/ Plan:    AKI on CKD 3b             - HTN, backdrop of pretty bad L-sided RAS (occlusive)--> so essentially he has 1 functioning kidney and is more susceptible to volume status/ nephrotoxic agents             - off Losartan  and torsemide              - on CRRT (start 12/10)- all 4K, no circuit heparin  for now  - ANA +, C4 low, + hematuria, anti-histone Ab +. - off unasyn  and hydralazine  (potential culprits)- will need a renal biopsy- appreciate IR--currently pending-hopefully today  - sig inflamed mucosa and ulcers on EGD 03/19/24--> review by PCCM suggest ischemic ulcers  - starteded solumedrol 03/27/24  - can stop CRRT, will address for IHD needs in the next 24-48hrs  -will need TDC at some point   2.  Petechial rash;             - appears like a drug rash             - stopped hydralazine  unasyn    3.  Acute on chronic CHF exacerbation;             - on CRRT, UF as tolerated   4.  H/o DVT and PE:             - had RH strain previously             - Eliquis  on hold for now- per primary  - on hep gtt   5.  Boorehave's syndrome             - s/p repair             - Has a PEG  - EGD 03/19/24 with ulcers  - PPI and pepcid    6.  Terminal Ileitis/ Duodenitis             - seen on CT scan 03/24/24   7. HTN  -on cleviprex --titrating down per primary service  Dispo: inpt  Discussed with ICU RN. Discussed with primary service.  Subjective:    Patient seen and examined.  Continues on CRRT.   No complaints. Open for renal biopsy today, time unknown. Net neg ~1.4L, UOP 35cc    Objective:   BP (!) 163/56   Pulse 83   Temp 98.8 F (37.1 C)   Resp 20   Ht 6' 1 (1.854 m)   Wt 77 kg   SpO2 100%   BMI 22.40 kg/m   Intake/Output Summary (Last 24 hours) at 04/02/2024 0853 Last data filed at 04/02/2024 0800 Gross per 24 hour  Intake 2200.16 ml  Output 3769.6 ml  Net -1569.44 ml   Weight change: 0.3 kg  Physical  Exam: GEN: awake NECK: no JVD PULM: cta bl, unlabored, normal wob CV: RRR ABD: + PEG, slightly distended/soft EXT: no sig edema b/l LEs SKIN: petechial nonblanching rash thighs, palms, arms NEURO: awake, alert, following commands Dialysis access: RIJ temp HD catheter in use  Imaging: No results found.   Labs: BMET Recent Labs  Lab 03/30/24 0420 03/30/24 1536 03/31/24 0552 03/31/24 1550 04/01/24 0428 04/01/24 1527 04/02/24 0425  NA 132* 136 134* 133* 132* 134* 130*  K 4.8 4.9 4.2 4.9 4.4 4.8 4.4  CL 100 102 100 101 99 103 97*  CO2 26 25 23 25 25 25  25  GLUCOSE 182* 229* 139* 198* 95 161* 213*  BUN 33* 39* 32* 32* 28* 29* 26*  CREATININE 1.27* 1.44* 1.37* 1.27* 1.09 1.29* 1.13  CALCIUM  7.1* 7.6* 7.9* 7.5* 7.4* 7.3* 7.2*  PHOS 1.5* 3.4 1.6* 4.4 2.5 2.8 2.5   CBC Recent Labs  Lab 03/27/24 0500 03/27/24 1152 03/30/24 0420 03/31/24 0552 04/01/24 0428 04/02/24 0425  WBC 18.0*   < > 21.2* 26.2* 22.9* 16.3*  NEUTROABS 16.1*  --   --   --   --   --   HGB 8.0*   < > 9.7* 11.3* 11.2* 9.0*  HCT 25.5*   < > 27.4* 34.6* 30.3* 27.6*  MCV 87.9   < > 86.2 84.0 86.1 85.2  PLT 229   < > 301 337 268 229   < > = values in this interval not displayed.    Medications:     atorvastatin   80 mg Per Tube Daily   Chlorhexidine  Gluconate Cloth  6 each Topical Daily   clonazePAM   0.5 mg Per Tube BID   ezetimibe   10 mg Per Tube Daily   feeding supplement (PROSource TF20)  60 mL Per Tube BID   fiber  1 packet Per Tube BID   [name] butt cream   Topical BID   guaiFENesin   10 mL Per Tube Q4H   insulin  aspart  0-9 Units Subcutaneous Q4H   insulin  glargine  10 Units Subcutaneous Daily   lidocaine   1 patch Transdermal Q24H   linezolid   600 mg Per Tube Q12H   methylPREDNISolone  (SOLU-MEDROL ) injection  60 mg Intravenous Daily   minoxidil   10 mg Per Tube Daily   multivitamin  1 tablet Per Tube QHS   pantoprazole  (PROTONIX ) IV  40 mg Intravenous Q12H   QUEtiapine   25 mg Per Tube BID    saccharomyces boulardii  250 mg Per Tube BID   sodium chloride  flush  10-40 mL Intracatheter Q12H   sucralfate   1 g Per Tube Q6H    Ephriam Stank, MD [facility] 04/02/2024, 8:53 AM

## 2024-04-02 NOTE — Plan of Care (Signed)

## 2024-04-02 NOTE — Progress Notes (Addendum)
 Patient ID: Billy [name], male   DOB: July 13, 1950, 73 y.o.   MRN: [id number]     Advanced Heart Failure Rounding Note  Cardiologist: Dorn Lesches, MD  Chief Complaint: S/p esophageal stent. AHF reconsulted 12/5 for A/C HFpEF.  Subjective:   - 10/6: esoph stent placement - 10/15: S/p EGD with stent repositioning by Dr. Shyrl  - 10/23: Ltd echo EF 60-65%  - 10/24: Barium swallow showed persistent leak  - 11/3: PEG tube placed - 11/11: L CT removed - 11/19: SVT in cephalic vein - 11/26: Stent removed. Esophagram (-) for esophageal leak - 11/28: Duodenitis/Enteritis. GI consulted. + bloody emesis.  - 12/2: EDG w/ ischemic ulcers. Carafate  increased + BID PPI - 12/4: PCCM consulted for increased WOB and progressive hypoxia.  - 12/5: AHF re consulted for A/C HFpEF - 12/10: Transferred back to ICU with acute respiratory distress, reintubated. Concern for drug-induced lupus 2/2 hydralazine . CRRT started.  Echo showed EF 60-65% with basal inferior akinesis, moderate LVH, normal RV size and mildly decreased systolic function.   Has had long, complicated hospitalization. Day: 72  Off pressors and now on clevidipine  gtt 4 to manage BP.  CVP <5, CVVH running -50.  Weight unchanged.   In atrial flutter morning of 12/15. Started on amiodarone  gtt. Remains in flutter but rates controlled.   He is on Solumedrol 60 mg IV daily.   Remains on linezolid  for MRSA in sputum from 12/10.    Feels fine this morning. Denies CP/SOB.   Objective:    Weight Range: 77 kg Body mass index is 22.4 kg/m.   Vital Signs:   Temp:  [97.9 F (36.6 C)-99.7 F (37.6 C)] 98.4 F (36.9 C) (12/16 0645) Pulse Rate:  [64-121] 84 (12/16 0645) Resp:  [0-40] 21 (12/16 0645) SpO2:  [94 %-100 %] 100 % (12/16 0645) Arterial Line BP: (86-179)/(43-113) 150/56 (12/16 0742) Weight:  [77 kg] 77 kg (12/16 0500) Last BM Date : 04/01/24  Weight change: Filed Weights   03/31/24 0500 04/01/24 0500 04/02/24 0500  Weight:  77.4 kg 76.7 kg 77 kg   Intake/Output:  Intake/Output Summary (Last 24 hours) at 04/02/2024 0815 Last data filed at 04/02/2024 0800 Gross per 24 hour  Intake 2200.16 ml  Output 3769.6 ml  Net -1569.44 ml    Physical Exam   General:  chronically ill / elderly appearing.  No respiratory difficulty Neck: JVD ~5 cm. RIJ HD cath Cor: Regular rate & irregular rhythm.   Lungs: clear, diminished bases Extremities: +1 thigh edema. + UNNA boots GU: + foley Neuro: alert & oriented x 3. Affect flat.   Telemetry   Atrial flutter 80s (Personally reviewed)    Labs    CBC Recent Labs    04/01/24 0428 04/02/24 0425  WBC 22.9* 16.3*  HGB 11.2* 9.0*  HCT 30.3* 27.6*  MCV 86.1 85.2  PLT 268 229   Basic Metabolic Panel Recent Labs    87/84/74 0428 04/01/24 1527 04/02/24 0425  NA 132* 134* 130*  K 4.4 4.8 4.4  CL 99 103 97*  CO2 25 25 25   GLUCOSE 95 161* 213*  BUN 28* 29* 26*  CREATININE 1.09 1.29* 1.13  CALCIUM  7.4* 7.3* 7.2*  MG 2.5*  --  2.4  PHOS 2.5 2.8 2.5   Liver Function Tests Recent Labs    04/01/24 1527 04/02/24 0425  ALBUMIN  1.7* 1.6*   BNP (last 3 results) Recent Labs    06/27/23 1116 03/22/24 0913 03/25/24 0535  BNP 646.4* 2,404.1*  831.9*   Hemoglobin A1C No results for input(s): HGBA1C in the last 72 hours.  Fasting Lipid Panel Recent Labs    03/31/24 0552  TRIG 76    Medications:    Scheduled Medications:  atorvastatin   80 mg Per Tube Daily   Chlorhexidine  Gluconate Cloth  6 each Topical Daily   clonazePAM   0.5 mg Per Tube BID   ezetimibe   10 mg Per Tube Daily   feeding supplement (PROSource TF20)  60 mL Per Tube BID   fiber  1 packet Per Tube BID   [name] butt cream   Topical BID   guaiFENesin   10 mL Per Tube Q4H   insulin  aspart  0-9 Units Subcutaneous Q4H   insulin  glargine  10 Units Subcutaneous Daily   lidocaine   1 patch Transdermal Q24H   linezolid   600 mg Per Tube Q12H   methylPREDNISolone  (SOLU-MEDROL ) injection  60  mg Intravenous Daily   minoxidil   10 mg Per Tube Daily   multivitamin  1 tablet Per Tube QHS   pantoprazole  (PROTONIX ) IV  40 mg Intravenous Q12H   QUEtiapine   25 mg Per Tube BID   saccharomyces boulardii  250 mg Per Tube BID   sodium chloride  flush  10-40 mL Intracatheter Q12H   sucralfate   1 g Per Tube Q6H    Infusions:  amiodarone  30 mg/hr (04/02/24 0800)   clevidipine  6 mg/hr (04/02/24 0800)   famotidine  (PEPCID ) IV     feeding supplement (VITAL 1.5 CAL) Stopped (04/02/24 0001)   PrismaSol  BGK 2/3.5 500 mL/hr at 04/01/24 2031   PrismaSol  BGK 2/3.5 400 mL/hr at 04/02/24 9353   prismasol  BGK 4/2.5 1,500 mL/hr at 04/02/24 9351   promethazine  (PHENERGAN ) injection (IM or IVPB) Stopped (03/18/24 0911)    PRN Medications: acetaminophen  (TYLENOL ) oral liquid 160 mg/5 mL, alum & mag hydroxide-simeth, diclofenac  Sodium, [COMPLETED] diphenoxylate -atropine  **FOLLOWED BY** diphenoxylate -atropine , docusate, famotidine  (PEPCID ) IV, glucagon  (human recombinant), heparin , heparin , heparin , HYDROmorphone  (DILAUDID ) injection, hydrOXYzine , lip balm, mouth rinse, mouth rinse, oxyCODONE , polyethylene glycol, promethazine  (PHENERGAN ) injection (IM or IVPB), simethicone , sodium chloride , sodium chloride  flush, traZODone   Patient Profile  Patient with longstanding history of chronic heart failure with preserved ejection fraction, RV failure with history of pulmonary embolism presents with esophageal perforation.  S/P Stent Esophageal Stent. Stent repositioned 10/15. AHF signed off 11/10. Re-consulted 03/22/24 with A/C HFpEF.  Assessment/Plan  1.  Esophageal Perforation - Boerhaave syndrome.  - CT C/A/P: with pneumomediastinum and large mass-like density in lower esophagus - 10/6: EGD with esophageal stent placed  - 10/15: Stent migrated w/ leak > back to OR for repositioning  - 10/24: barium swallow w persistent leak  - 11/3: PEG placed - on TF and meds by PEG.  - 11/26: Stent removed. Esophagram  (-) for esophageal leak - 12/2: EDG w/ ischemic ulcers. Carafate  increased + BID PPI - Remains NPO   2. Acute on chronic HFpEF - h/o cardiogenic shock with RV failure in the setting of cardiac arrest thought to be caused by acute PE vs ACS in 3/25. Echo at the time showed EF 55%, severe, LVH, G2DD, and severely reduced RV function with strain. There was concern for cardiac sarcoid amyloid, however CMR LGE consistent with ischemic disease. RV on echo 9/25 with complete recovery, EF 60-65%, nl RV function.    - RHC 3/25: Mild PAH with severe RV failure though CO is preserved.   - Echo 10/25 EF 60-65%, RV normal.  - Echo 12/10: EF 60-65% with basal inferior akinesis,  moderate LVH, normal RV size and mildly decreased systolic function.  - Off pressors and now on clevidipine  for HTN.   - Has had significant peripheral edema though CVP now < 5.  Weight unchanged and peripheral edema improved.  Plan to continue CVVHD until filter clots or stop when he's going to IR. Will not restart after per nephrology, will follow for iHD needs.  - Co-ox 67%.   3. CAD:  - LHC 3/25: 3v CAD with CTO RCA with L->R collaterals and moderate non-obstructive CAD in L system. Medical management.  - suspect CP related to esophageal issues and not CAD - No chest pain.     4.  AKI on CKD Stage 3b: Baseline sCr ~ 2.1. - Worsening AKI> CRRT - L RAS. Continue CRRT  - Continue UF to 50 cc/hr. Once stopped will follow for iHD needs per nephrology.   5. PAF:   - s/p TEE/DCCV 3/25 - still in atrial flutter this morning but rates controlled on IV amiodarone .  - Currently off hep gtt with plans for biopsy later today  6.  H/o DVT/PE:  Bilateral upper and lower DVTs, mixed acute and chronic.  - s/p infrarenal IVC placement 3/25 - V/Q 3/25 with multiple perfusion defects - Heparin  drip on hold.    7. Carotid stenosis: h/o CVA. TCAR in 6/24. Okay to remain off plavix  at this point. - carotid US  9/25: widely patent carotid  stent on R and no stenosis on L   8. Acute Hypoxic Respiratory Failure/PNA/ Exudative Lt Pleural Effusion  - CT placed for pleural effusion. Resp Cx w/ rare yeast, BCx NGTD  - Re-intubated 12/10 per CCM.  - Extubated 12/12.   9. Severe protein calorie malnutrition - Nutrition following. PEG tube + TF  10. HTN - Clevidipine  IV - He has had problems with both hydralazine  and amlodipine .   - Increase minoxidil  to 10 mg daily - Goal to stop cleviprex  today.  - Off clonidine  with concern for nephritis.   11. Superficial LUE thrombus - Continue PICC - Heparin  gtt on hold  12. Petechial Rash - With diffuse petechial rash, ANA+, anti-histone ab+, and low C4, there is concern for possible drug-induced lupus from hydralazine  with AKI.  ANCA negative.  - Discussed with nephrology and CCM, would ideally get a renal biopsy.  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 Holding heparin .  - He is now on empiric Solumedrol.  Not ideal with friable esophagus but tolerating so far.   13. Anemia - Hgb 11.2>9, had transfusion 12/12.   CRITICAL CARE Performed by: Beckey LITTIE Coe   Total critical care time: 13 minutes  Critical care time was exclusive of separately billable procedures and treating other patients.  Critical care was necessary to treat or prevent imminent or life-threatening deterioration.  Critical care was time spent personally by me on the following activities: development of treatment plan with patient and/or surrogate as well as nursing, discussions with consultants, evaluation of patient's response to treatment, examination of patient, obtaining history from patient or surrogate, ordering and performing treatments and interventions, ordering and review of laboratory studies, ordering and review of radiographic studies, pulse oximetry and re-evaluation of patient's condition.   Beckey LITTIE Coe 04/02/2024 8:15  AM  Patient seen with NP, I formulated the plan and agree with the above note.   He is back on clevidipine  this morning, clonidine  was stopped.  Remains on minoxidil .  CVP < 5.  CVVH running even to 50 cc/hr net negative.  I/Os -1463 net yesterday. Co-ox 67%.   Atrial fibrillation on amiodarone  gtt and heparin  gtt.   General: NAD Neck: No JVD, no thyromegaly or thyroid  nodule.  Lungs: Clear to auscultation bilaterally with normal respiratory effort. CV: Nondisplaced PMI.  Heart irregular S1/S2, no S3/S4, no murmur.  No peripheral edema.   Abdomen: Soft, nontender, no hepatosplenomegaly, no distention.  Skin: Petechial rash arms/legs.  Neurologic: Alert and oriented x 3.  Psych: Normal affect. Extremities: No clubbing or cyanosis.  HEENT: Normal.   He remains on linezolid  for MRSA on BAL.    Volume status looks good this morning, CVP < 5.  CVVH to stop today, follow for iHD needs.     See discussion above, concern for drug-induced lupus with AKI.  He has been on Solumedrol.   - Renal biopsy today with IR.    Continue IV amiodarone  30 mg/hr now that he is back in atrial fib/flutter.  Continue heparin , can hold for biopsy.   Still on clevidipine  gtt, clonidine  stopped. Increase minoxidil  today and wean off clevidipine .  We are very limited in terms of BP meds that can be used.   CRITICAL CARE Performed by: Ezra Shuck  Total critical care time: 35 minutes  Critical care time was exclusive of separately billable procedures and treating other patients.  Critical care was necessary to treat or prevent imminent or life-threatening deterioration.  Critical care was time spent personally by me on the following activities: development of treatment plan with patient and/or surrogate as well as nursing, discussions with consultants, evaluation of patient's response to treatment, examination of patient, obtaining history from patient or surrogate, ordering and performing treatments and  interventions, ordering and review of laboratory studies, ordering and review of radiographic studies, pulse oximetry and re-evaluation of patient's condition.   Ezra Shuck 04/02/2024 11:05 AM

## 2024-04-03 ENCOUNTER — Inpatient Hospital Stay (HOSPITAL_COMMUNITY)

## 2024-04-03 DIAGNOSIS — I130 Hypertensive heart and chronic kidney disease with heart failure and stage 1 through stage 4 chronic kidney disease, or unspecified chronic kidney disease: Secondary | ICD-10-CM | POA: Diagnosis not present

## 2024-04-03 DIAGNOSIS — K529 Noninfective gastroenteritis and colitis, unspecified: Secondary | ICD-10-CM | POA: Diagnosis not present

## 2024-04-03 DIAGNOSIS — J15212 Pneumonia due to Methicillin resistant Staphylococcus aureus: Secondary | ICD-10-CM | POA: Diagnosis not present

## 2024-04-03 DIAGNOSIS — I480 Paroxysmal atrial fibrillation: Secondary | ICD-10-CM | POA: Diagnosis not present

## 2024-04-03 DIAGNOSIS — E785 Hyperlipidemia, unspecified: Secondary | ICD-10-CM | POA: Diagnosis not present

## 2024-04-03 DIAGNOSIS — I5033 Acute on chronic diastolic (congestive) heart failure: Secondary | ICD-10-CM | POA: Diagnosis not present

## 2024-04-03 DIAGNOSIS — K223 Perforation of esophagus: Secondary | ICD-10-CM | POA: Diagnosis not present

## 2024-04-03 DIAGNOSIS — I5023 Acute on chronic systolic (congestive) heart failure: Secondary | ICD-10-CM | POA: Diagnosis not present

## 2024-04-03 DIAGNOSIS — N1832 Chronic kidney disease, stage 3b: Secondary | ICD-10-CM | POA: Diagnosis not present

## 2024-04-03 DIAGNOSIS — E43 Unspecified severe protein-calorie malnutrition: Secondary | ICD-10-CM | POA: Diagnosis not present

## 2024-04-03 DIAGNOSIS — J90 Pleural effusion, not elsewhere classified: Secondary | ICD-10-CM | POA: Diagnosis not present

## 2024-04-03 DIAGNOSIS — Z86711 Personal history of pulmonary embolism: Secondary | ICD-10-CM | POA: Diagnosis not present

## 2024-04-03 DIAGNOSIS — J9601 Acute respiratory failure with hypoxia: Secondary | ICD-10-CM | POA: Diagnosis not present

## 2024-04-03 DIAGNOSIS — N183 Chronic kidney disease, stage 3 unspecified: Secondary | ICD-10-CM | POA: Diagnosis not present

## 2024-04-03 DIAGNOSIS — N179 Acute kidney failure, unspecified: Secondary | ICD-10-CM | POA: Diagnosis not present

## 2024-04-03 LAB — COOXEMETRY PANEL
Carboxyhemoglobin: 1.7 % — ABNORMAL HIGH (ref 0.5–1.5)
Methemoglobin: 0.9 % (ref 0.0–1.5)
O2 Saturation: 70.1 %
Total hemoglobin: 8.8 g/dL — ABNORMAL LOW (ref 12.0–16.0)

## 2024-04-03 LAB — RENAL FUNCTION PANEL
Albumin: 2.5 g/dL — ABNORMAL LOW (ref 3.5–5.0)
Anion gap: 9 (ref 5–15)
BUN: 53 mg/dL — ABNORMAL HIGH (ref 8–23)
CO2: 26 mmol/L (ref 22–32)
Calcium: 7.9 mg/dL — ABNORMAL LOW (ref 8.9–10.3)
Chloride: 98 mmol/L (ref 98–111)
Creatinine, Ser: 2.57 mg/dL — ABNORMAL HIGH (ref 0.61–1.24)
GFR, Estimated: 26 mL/min — ABNORMAL LOW (ref >60)
Glucose, Bld: 153 mg/dL — ABNORMAL HIGH (ref 70–99)
Phosphorus: 3.6 mg/dL (ref 2.5–4.6)
Potassium: 4.9 mmol/L (ref 3.5–5.1)
Sodium: 133 mmol/L — ABNORMAL LOW (ref 135–145)

## 2024-04-03 LAB — CBC
HCT: 26.1 % — ABNORMAL LOW (ref 39.0–52.0)
HCT: 24.7 % — ABNORMAL LOW (ref 39.0–52.0)
Hemoglobin: 8.3 g/dL — ABNORMAL LOW (ref 13.0–17.0)
Hemoglobin: 8 g/dL — ABNORMAL LOW (ref 13.0–17.0)
MCH: 26.4 pg (ref 26.0–34.0)
MCH: 27 pg (ref 26.0–34.0)
MCHC: 31.8 g/dL (ref 30.0–36.0)
MCHC: 32.4 g/dL (ref 30.0–36.0)
MCV: 83.1 fL (ref 80.0–100.0)
MCV: 83.4 fL (ref 80.0–100.0)
Platelets: 233 K/uL (ref 150–400)
Platelets: 221 K/uL (ref 150–400)
RBC: 3.14 MIL/uL — ABNORMAL LOW (ref 4.22–5.81)
RBC: 2.96 MIL/uL — ABNORMAL LOW (ref 4.22–5.81)
RDW: 15.9 % — ABNORMAL HIGH (ref 11.5–15.5)
RDW: 15.9 % — ABNORMAL HIGH (ref 11.5–15.5)
WBC: 16.1 K/uL — ABNORMAL HIGH (ref 4.0–10.5)
WBC: 17.3 K/uL — ABNORMAL HIGH (ref 4.0–10.5)
nRBC: 0 % (ref 0.0–0.2)
nRBC: 0 % (ref 0.0–0.2)

## 2024-04-03 LAB — HEPARIN LEVEL (UNFRACTIONATED): Heparin Unfractionated: 0.32 [IU]/mL (ref 0.30–0.70)

## 2024-04-03 LAB — GLUCOSE, CAPILLARY
Glucose-Capillary: 109 mg/dL — ABNORMAL HIGH (ref 70–99)
Glucose-Capillary: 128 mg/dL — ABNORMAL HIGH (ref 70–99)
Glucose-Capillary: 132 mg/dL — ABNORMAL HIGH (ref 70–99)
Glucose-Capillary: 135 mg/dL — ABNORMAL HIGH (ref 70–99)
Glucose-Capillary: 143 mg/dL — ABNORMAL HIGH (ref 70–99)
Glucose-Capillary: 146 mg/dL — ABNORMAL HIGH (ref 70–99)
Glucose-Capillary: 169 mg/dL — ABNORMAL HIGH (ref 70–99)

## 2024-04-03 LAB — APTT: aPTT: 71 s — ABNORMAL HIGH (ref 24–36)

## 2024-04-03 LAB — MAGNESIUM: Magnesium: 2.5 mg/dL — ABNORMAL HIGH (ref 1.7–2.4)

## 2024-04-03 LAB — HEPATITIS B SURFACE ANTIGEN: Hepatitis B Surface Ag: NONREACTIVE

## 2024-04-03 MED ORDER — PREDNISONE 20 MG PO TABS: 20.0000 mg | ORAL_TABLET | Freq: Every day | ORAL | Status: DC

## 2024-04-03 MED ORDER — PREDNISONE 50 MG PO TABS
60.0000 mg | ORAL_TABLET | Freq: Every day | ORAL | Status: AC
  Administered 2024-04-04 – 2024-04-06 (×3): 60 mg via ORAL
  Filled 2024-04-03: qty 3
  Filled 2024-04-03 (×2): qty 1

## 2024-04-03 MED ORDER — ENSURE PLUS HIGH PROTEIN PO LIQD
237.0000 mL | Freq: Two times a day (BID) | ORAL | Status: DC
  Administered 2024-04-03 – 2024-04-05 (×3): 237 mL via ORAL

## 2024-04-03 MED ORDER — AMIODARONE HCL 200 MG PO TABS
200.0000 mg | ORAL_TABLET | Freq: Two times a day (BID) | ORAL | Status: DC
  Administered 2024-04-03 – 2024-04-14 (×23): 200 mg
  Filled 2024-04-03 (×23): qty 1

## 2024-04-03 MED ORDER — PREDNISONE 10 MG PO TABS: 10.0000 mg | ORAL_TABLET | Freq: Every day | ORAL | Status: DC

## 2024-04-03 MED ORDER — HEPARIN (PORCINE) 25000 UT/250ML-% IV SOLN
1800.0000 [IU]/h | INTRAVENOUS | Status: AC
  Administered 2024-04-03 (×2): 1800 [IU]/h via INTRAVENOUS
  Filled 2024-04-03 (×2): qty 250

## 2024-04-03 MED ORDER — PREDNISONE 20 MG PO TABS
40.0000 mg | ORAL_TABLET | Freq: Every day | ORAL | Status: DC
  Administered 2024-04-07: 40 mg via ORAL
  Filled 2024-04-03: qty 2

## 2024-04-03 NOTE — Progress Notes (Signed)
 Nutrition Follow-up  DOCUMENTATION CODES:   Severe malnutrition in context of acute illness/injury  INTERVENTION:   Initiate Calorie Count to better assess po intake and tolerance  Liberalize diet to REGULAR with continued fluid restriction  Add Ensure Plus High Protein po BID, each supplement provides 350 kcal and 20 grams of protein  Tube Feeding via PEG: continue current TF with plans to reassess post calorie count Vital 1.5 at 65 ml/hr Begin TF at 25 ml/hr, titrate by 10 mL q  6 hours until goal rate of 65 ml/hr Pro-Source TF20 60 mL BID TF at goal rate provides 145 g of protein, 2500 kcals, 1186 mL of free water  over 24 hours  Currently receiving some lipid calories via Cleviprex  gtt  NUTRITION DIAGNOSIS:   Severe Malnutrition related to acute illness (may have chronic component due to time frame of reported wt loss) as evidenced by severe fat depletion, moderate muscle depletion.  Being addressed via TF, oral diet  GOAL:   Patient will meet greater than or equal to 90% of their needs  Progressing  MONITOR:   PO intake, Supplement acceptance, TF tolerance, Labs, I & O's  REASON FOR ASSESSMENT:   Consult Enteral/tube feeding initiation and management  ASSESSMENT:   73 yo male admitted post several days of N/V followed by sudden onset of severe epigastric pain radiating to chest and back. Pt found to have small esophageal perforation above GE junction. PMH includes CAD, chronic HFpEF, HTN, HLD, DVT, CVA, PAF, CKD 3b.  12/15 Diet advanced to Renal  12/16 CRRT discontinued, Renal Biopsy performed- results pending  Remains on Cleviprex . Noted plan for HD tomorrow with plan for [facility] at some point  Diet advanced to Renal with 1200 mL fluid restriction on 12/15 but has missed meals on 15th and 16th due to NPO. Pt ate 100% of breakfast, appetite ok. Ate 45% at dinner last night  Vital 1.5 at 65 ml/hr via PEG tube  Diarrhea has improved; FMS removed. Last BM 12/15  except a few smears yesterday. Currently on Nutrisource Fiber BID. Miralax    ANA+, C4 low, anti-histone Ab+  Micronutrient Labs (12/05): CRP 10.8 (H) Vitamin K 0.49 (wdl)  Vitamin C 0.6 (wdl)  Labs: Potassium 4.9 (wdl Phosphorus 3.6 (wdl) Magnesium  2.5 (H)  Meds: SS Novolog  Lantus  10 units IV Solumedrol Rena-Vite daily  Diet Order:   Diet Order             Diet regular Room service appropriate? Yes with Assist; Fluid consistency: Thin; Fluid restriction: 1200 mL Fluid  Diet effective now                   EDUCATION NEEDS:   Education needs have been addressed  Skin:  Skin Assessment: Reviewed RN Assessment Skin Integrity Issues:: Stage I Stage I: n/a  Last BM:  12/8 type 7 , hx of significant diarrhea this admission  Height:   Ht Readings from Last 1 Encounters:  03/29/24 6' 1 (1.854 m)    Weight:   Wt Readings from Last 1 Encounters:  04/03/24 76.8 kg    Ideal Body Weight:  83.6 kg  BMI:  Body mass index is 22.34 kg/m.  Estimated Nutritional Needs:   Kcal:  2500-2700 kcals  Protein:  130-150 g  Fluid:  1L plus UOP  Betsey Finger MS, RDN, LDN, CNSC Registered Dietitian 3 Clinical Nutrition RD Inpatient Contact Info in Amion

## 2024-04-03 NOTE — Progress Notes (Addendum)
 Physical Therapy Treatment Patient Details Name: Billy [name] MRN: [id number] DOB: [birth year] Today's Date: 04/03/2024   History of Present Illness Billy [name] is a 73 y.o. male who presented to [facility] ED 01/21/24 with several days of vomiting followed by an episode of severe tearing epigastric pain. Work-up revealed esophageal tear. Pt s/p esophageal stent with EGD 10/7. Attempted stent repositioning on 10/15 and was hypoxic, was intubated.  Reintubated 10/20-10/24. G tube placed 11/3. He accidentally pulled out the R chest tube on 11/7.L chest tube removed 11/11. Found to have superficial venous thrombosis of RUE 11/19. N&V with epigastric pain 11/28, found severe duodenitis on repeat CT. S/P EGD on 12/2. 12/10 re-intubated due to respiratory distress. CRRT 12/10-12/16. Extubated 12/12. PMHx: HFrEF, AFib, CAD, PE complicated by cardiogenic shock, CKD 4, MI, peripheral neuropathy, and TIA.    PT Comments  The pt is making good functional progress. He was able to progress to OOB mobility and gait training this date. However, he fatigues quickly and could benefit from a chair follow for attempts at ambulating further in the future. He is at high risk for falls, needing minAx2 for transfers and minA (+2 safety/line management) to ambulate up to ~60 ft with a RW. He maintains a flexed posture when ambulating, suggesting core weakness. Considering his multiple medical complexities and his functional decline, will keep recs for LTACH currently. Will continue to follow acutely.     If plan is discharge home, recommend the following: Assistance with cooking/housework;Direct supervision/assist for medications management;Assist for transportation;Help with stairs or ramp for entrance;Two people to help with walking and/or transfers;Two people to help with bathing/dressing/bathroom;Assistance with feeding;Direct supervision/assist for financial management   Can travel by private vehicle     No  Equipment  Recommendations  Rolling walker (2 wheels);BSC/3in1;Wheelchair (measurements PT);Wheelchair cushion (measurements PT);Hospital bed (pending progress)    Recommendations for Other Services       Precautions / Restrictions Precautions Precautions: Fall Recall of Precautions/Restrictions: Impaired Precaution/Restrictions Comments: PEG, A-line Restrictions Weight Bearing Restrictions Per Provider Order: No     Mobility  Bed Mobility Overal bed mobility: Needs Assistance Bed Mobility: Supine to Sit     Supine to sit: Min assist, HOB elevated, Used rails     General bed mobility comments: Min HH assist for trunk and to scoot EOB    Transfers Overall transfer level: Needs assistance Equipment used: Rolling walker (2 wheels) Transfers: Sit to/from Stand Sit to Stand: Min assist, +2 physical assistance, +2 safety/equipment           General transfer comment: Min +2 boost to stand with pt pushing up from EOB, x2 reps    Ambulation/Gait Ambulation/Gait assistance: Min assist, +2 safety/equipment Gait Distance (Feet): 60 Feet (x2 bouts of ~60 ft > ~ 15 ft) Assistive device: Rolling walker (2 wheels) Gait Pattern/deviations: Step-through pattern, Decreased stride length, Trunk flexed Gait velocity: reduced Gait velocity interpretation: <1.8 ft/sec, indicate of risk for recurrent falls   General Gait Details: Pt takes slow, small steps while maintaining a flexed posture with inferior gaze. Verbal and tactile cues provided to stand upright, look anteriorly, and relax shoulders but min success noted. Pt fatigues quickly. MinA for balance. Assistance needed intermittently for RW management when turning. +2 for safety/lines   Stairs             Wheelchair Mobility     Tilt Bed    Modified Rankin (Stroke Patients Only)       Balance Overall balance assessment: Needs assistance  Sitting-balance support: No upper extremity supported, Feet supported Sitting  balance-Leahy Scale: Fair Sitting balance - Comments: static sitting EOB with CGA   Standing balance support: Reliant on assistive device for balance, During functional activity, Bilateral upper extremity supported Standing balance-Leahy Scale: Poor Standing balance comment: Reliant on RW                            Communication Communication Communication: Impaired Factors Affecting Communication: Hearing impaired;Difficulty expressing self  Cognition Arousal: Alert Behavior During Therapy: Flat affect   PT - Cognitive impairments: Attention, Awareness, Sequencing, Problem solving                       PT - Cognition Comments: Pt aware of his risk for falls and able to indicate when he needs to rest, but pt with poor awareness of his trunk flexion in standing. He needs cues to sequence turning the RW to get proximal to bed or chair to sit. Following commands: Impaired Following commands impaired: Follows one step commands with increased time    Cueing Cueing Techniques: Verbal cues, Tactile cues, Gestural cues  Exercises      General Comments General comments (skin integrity, edema, etc.): VSS on RA      Pertinent Vitals/Pain Pain Assessment Pain Assessment: Faces Faces Pain Scale: Hurts little more Pain Location: abdomen Pain Descriptors / Indicators: Discomfort, Guarding Pain Intervention(s): Monitored during session, Limited activity within patient's tolerance, Repositioned    Home Living                          Prior Function            PT Goals (current goals can now be found in the care plan section) Acute Rehab PT Goals Patient Stated Goal: to improve PT Goal Formulation: With patient Time For Goal Achievement: 04/12/24 Potential to Achieve Goals: Fair Progress towards PT goals: Progressing toward goals    Frequency    Min 2X/week      PT Plan      Co-evaluation   Reason for Co-Treatment: Complexity of the  patient's impairments (multi-system involvement);For patient/therapist safety;To address functional/ADL transfers;Necessary to address cognition/behavior during functional activity PT goals addressed during session: Mobility/safety with mobility;Balance;Proper use of DME OT goals addressed during session: ADL's and self-care;Strengthening/ROM      AM-PAC PT 6 Clicks Mobility   Outcome Measure  Help needed turning from your back to your side while in a flat bed without using bedrails?: A Little Help needed moving from lying on your back to sitting on the side of a flat bed without using bedrails?: A Little Help needed moving to and from a bed to a chair (including a wheelchair)?: Total Help needed standing up from a chair using your arms (e.g., wheelchair or bedside chair)?: Total Help needed to walk in hospital room?: A Little Help needed climbing 3-5 steps with a railing? : Total 6 Click Score: 12    End of Session   Activity Tolerance: Patient tolerated treatment well Patient left: in chair;with call bell/phone within reach;with chair alarm set Nurse Communication: Mobility status PT Visit Diagnosis: Difficulty in walking, not elsewhere classified (R26.2);Unsteadiness on feet (R26.81);Other abnormalities of gait and mobility (R26.89);Muscle weakness (generalized) (M62.81);Other symptoms and signs involving the nervous system (R29.898);History of falling (Z91.81)     Time: 8964-8891 PT Time Calculation (min) (ACUTE ONLY): 33 min  Charges:    $  Gait Training: 8-22 mins PT General Charges $$ ACUTE PT VISIT: 1 Visit                     Theo Ferretti, PT, DPT Acute Rehabilitation Services  Office: [phone]    Theo CHRISTELLA Ferretti 04/03/2024, 11:58 AM

## 2024-04-03 NOTE — Progress Notes (Addendum)
 04/03/2024   I have seen and evaluated the patient for resp failure and agree with APP's exam and plan below with following additions:  Doing well post biopsy Post biopsy Renal US  ok Ongoing intermittent pain around PEG site chronic since procedure Hgb okay Restart heparin  Work on mobility [facility] consult placed Regarding steroids, I am pretty sure biopsy will be neg, switch steroids to PO taper as ordered; should biopsy come back c/w ANCA may need DMARD instead PPI indefinitely, H2b PRN, carafate  x 2 weeks Work on mobility Once procedures done and H/H stable should transition to eliquis  Minoxidil  is working for BP, remove A line, DC cleviprex  Insulin  basal bolus Multimodal pain control Discussed TEE/cardioversion with CHF team, don't really think it's going to buy us  much and pretty high risk Stable for transfer to floor for ongoing care, appreciate [name] taking over 04/04/24      Rolan Sharps MD [place] Prefer epic messenger for cross cover needs
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
## 2024-04-03 NOTE — Progress Notes (Signed)
 [facility] Progress Note   Assessment/ Plan:    AKI on CKD 3b             - HTN, backdrop of pretty bad L-sided RAS (occlusive)--> so essentially he has 1 functioning kidney and is more susceptible to volume status/ nephrotoxic agents             - off Losartan  and torsemide   -s/p CRRT 12/10-12/16  - ANA +, C4 low, + hematuria, anti-histone Ab +. - off unasyn  and hydralazine  (potential culprits)- s/p renal biopsy 12/16-pending read  - sig inflamed mucosa and ulcers on EGD 03/19/24--> review by PCCM suggest ischemic ulcers - starteded solumedrol 03/27/24, now on 60mg  daily since 12/13, will decrease to 40mg  tomorrow  -no HD needs today, will tentatively plan for HD tomorrow  -will need TDC at some point  -will get a post biopsy renal ultrasound out of precaution   2.  Petechial rash;             - appears like a drug rash             - stopped hydralazine  & unasyn   -improving   3.  Acute on chronic CHF exacerbation;             - stable vol status today, UF as tolerated with HD   4.  H/o DVT and PE:             - had RH strain previously             - Eliquis  on hold for now- per primary   5.  Boorehave's syndrome             - s/p repair             - Has a PEG  - EGD 03/19/24 with ulcers  - PPI and pepcid    6.  Terminal Ileitis/ Duodenitis             - seen on CT scan 03/24/24   7. HTN  -on cleviprex --titrating down per primary service  Dispo: inpt  Discussed with primary service.  Subjective:    Patient seen and examined.  S/p renal biopsy yesterday-tolerated. He does report some abd pain-ongoing issue, axr yesterday with poss ileus He denies any flank pain, chest pain, SOB   Objective:   BP (!) 117/44   Pulse (!) 111   Temp 99.5 F (37.5 C)   Resp (!) 25   Ht 6' 1 (1.854 m)   Wt 76.8 kg   SpO2 100%   BMI 22.34 kg/m   Intake/Output Summary (Last 24 hours) at 04/03/2024 0810 Last data filed at 04/03/2024 0700 Gross per 24 hour  Intake  761.91 ml  Output 240 ml  Net 521.91 ml   Weight change: -0.2 kg  Physical Exam: GEN: awake NECK: no JVD PULM: cta bl, unlabored, normal wob CV: RRR ABD: + PEG, soft, NT EXT: no sig edema b/l LEs SKIN: petechial nonblanching rash thighs, palms, arms (improving) NEURO: awake, alert, following commands Dialysis access: RIJ temp HD catheter  Imaging: DG Abd 1 View Result Date: 04/02/2024 CLINICAL DATA:  [id number] Abdominal pain [id number] EXAM: ABDOMEN - 1 VIEW COMPARISON:  March 30, 2024 FINDINGS: Similar gaseous distension of the small bowel and colon.Percutaneous gastrostomy tube noted overlying the stomach.No pneumoperitoneum. No organomegaly or radiopaque calculi. No acute fracture or destructive lesion. Similar changes in the lung bases. IVC filter again noted apex to the  right of L2.Temperature probe overlying the midline pelvis. Mild bilateral hip osteoarthritis. Multilevel degenerative disc disease of the spine. IMPRESSION: Diffuse gaseous distention of the small bowel and colon, as can be seen in an adynamic ileus. Continued close radiographic follow-up recommended. Electronically Signed   By: Rogelia Myers M.D.   On: 04/02/2024 18:42   US  BIOPSY (KIDNEY) Result Date: 04/02/2024 INDICATION: 73 year old with acute on chronic kidney disease. Concern for vasculitis or autoimmune process. Left kidney is atrophic. EXAM: ULTRASOUND-GUIDED RANDOM RENAL BIOPSY MEDICATIONS: Moderate sedation ANESTHESIA/SEDATION: Moderate (conscious) sedation was employed during this procedure. A total of Versed  1 mg and Fentanyl  50 mcg was administered intravenously by the radiology nurse. Total intra-service moderate Sedation Time: 15 minutes. The patient's level of consciousness and vital signs were monitored continuously by radiology nursing throughout the procedure under my direct supervision. FLUOROSCOPY TIME:  None COMPLICATIONS: None immediate. PROCEDURE: Informed written consent was obtained from the  patient after a thorough discussion of the procedural risks, benefits and alternatives. All questions were addressed. A timeout was performed prior to the initiation of the procedure. Patient was placed prone. Right flank was evaluated with ultrasound. Right kidney was selected for biopsy. Right flank was prepped and draped in sterile fashion. Maximal barrier sterile technique was utilized including caps, mask, sterile gowns, sterile gloves, sterile drape, hand hygiene and skin antiseptic. Skin was anesthetized using 1% lidocaine . Small incision was made. Using ultrasound guidance, a 17 gauge coaxial needle was directed to the upper pole cortex. Two core biopsies were obtained from the upper pole cortex with an 18 gauge core device. Specimens placed in saline. Gel-Foam slurry was injected as the 17 gauge coaxial needle was retracted. Bandage placed over the puncture site. FINDINGS: Right kidney has an unusual orientation and upper pole is most posterior and accessible for biopsy. Two adequate core specimens obtained from the right kidney upper pole. No immediate bleeding or hematoma formation. IMPRESSION: Successful ultrasound-guided right renal biopsy. Electronically Signed   By: [name] Balder M.D.   On: 04/02/2024 14:21     Labs: BMET Recent Labs  Lab 03/30/24 1536 03/31/24 0552 03/31/24 1550 04/01/24 0428 04/01/24 1527 04/02/24 0425 04/03/24 0517  NA 136 134* 133* 132* 134* 130* 133*  K 4.9 4.2 4.9 4.4 4.8 4.4 4.9  CL 102 100 101 99 103 97* 98  CO2 25 23 25 25 25 25 26   GLUCOSE 229* 139* 198* 95 161* 213* 153*  BUN 39* 32* 32* 28* 29* 26* 53*  CREATININE 1.44* 1.37* 1.27* 1.09 1.29* 1.13 2.57*  CALCIUM  7.6* 7.9* 7.5* 7.4* 7.3* 7.2* 7.9*  PHOS 3.4 1.6* 4.4 2.5 2.8 2.5 3.6   CBC Recent Labs  Lab 03/31/24 0552 04/01/24 0428 04/02/24 0425 04/03/24 0517  WBC 26.2* 22.9* 16.3* 16.1*  HGB 11.3* 11.2* 9.0* 8.3*  HCT 34.6* 30.3* 27.6* 26.1*  MCV 84.0 86.1 85.2 83.1  PLT 337 268 229 233     Medications:     atorvastatin   80 mg Per Tube Daily   Chlorhexidine  Gluconate Cloth  6 each Topical Daily   clonazePAM   0.5 mg Per Tube BID   ezetimibe   10 mg Per Tube Daily   feeding supplement (PROSource TF20)  60 mL Per Tube BID   fiber  1 packet Per Tube BID   [name] butt cream   Topical BID   guaiFENesin   10 mL Per Tube Q4H   insulin  aspart  0-9 Units Subcutaneous Q4H   insulin  glargine  10 Units Subcutaneous Daily  lidocaine   1 patch Transdermal Q24H   methylPREDNISolone  (SOLU-MEDROL ) injection  60 mg Intravenous Daily   minoxidil   10 mg Per Tube Daily   multivitamin  1 tablet Per Tube QHS   oxidized cellulose  1 each Topical Once   pantoprazole  (PROTONIX ) IV  40 mg Intravenous Q12H   QUEtiapine   25 mg Per Tube BID   saccharomyces boulardii  250 mg Per Tube BID   sodium chloride  flush  10-40 mL Intracatheter Q12H    Ephriam Stank, MD [facility] 04/03/2024, 8:10 AM

## 2024-04-03 NOTE — Progress Notes (Signed)
 Occupational Therapy Treatment Patient Details Name: Billy [name] MRN: [id number] DOB: 1951-03-17 Today's Date: 04/03/2024   History of present illness Billy [name] is a 73 y.o. male who presented to [facility] ED 01/21/24 with several days of vomiting followed by an episode of severe tearing epigastric pain. Work-up revealed esophageal tear. Pt s/p esophageal stent with EGD 10/7. Attempted stent repositioning on 10/15 and was hypoxic, was intubated.  Reintubated 10/20-10/24. G tube placed 11/3. He accidentally pulled out the R chest tube on 11/7.L chest tube removed 11/11. Found to have superficial venous thrombosis of RUE 11/19. N&V with epigastirc pain 11/28, found severe duodenitis on repeat CT. S/P EGD on 12/2. 12/10 re-intubated due to respiratory distress, CRRT started. Extubated 12/12. PMHx: HFrEF, AFib, CAD, PE complicated by cardiogenic shock, CKD 4, MI, peripheral neuropathy, and TIA.   OT comments  Pt progressing well towards goals. Session focused on improving activity tolerance. Progressed to complete STS with min +2 and standing tolerance with RW. Pt continues to be limited by decreased activity tolerance, requiring rest breaks with activity.  Continue to recommend LTACH to optimize independence levels. Will continue to follow acutely.       If plan is discharge home, recommend the following:  Two people to help with walking and/or transfers;Two people to help with bathing/dressing/bathroom;Assistance with cooking/housework;Assistance with feeding;Assist for transportation   Equipment Recommendations  Other (comment) (Defer)       Precautions / Restrictions Precautions Precautions: Fall Recall of Precautions/Restrictions: Impaired Precaution/Restrictions Comments: PEG, A-line Restrictions Weight Bearing Restrictions Per Provider Order: No       Mobility Bed Mobility Overal bed mobility: Needs Assistance Bed Mobility: Supine to Sit     Supine to sit: Min assist, HOB elevated, Used  rails     General bed mobility comments: Min HH assist for trunk and to scoot EOB    Transfers Overall transfer level: Needs assistance Equipment used: Rolling walker (2 wheels) Transfers: Sit to/from Stand Sit to Stand: Min assist, +2 physical assistance, +2 safety/equipment           General transfer comment: Min +2 boost to stand     Balance Overall balance assessment: Needs assistance Sitting-balance support: No upper extremity supported, Feet supported Sitting balance-Leahy Scale: Fair     Standing balance support: Reliant on assistive device for balance, During functional activity, Bilateral upper extremity supported Standing balance-Leahy Scale: Poor Standing balance comment: Reliant on RW       ADL either performed or assessed with clinical judgement   ADL Overall ADL's : Needs assistance/impaired     Grooming: Set up;Sitting       Lower Body Dressing: Moderate assistance;Sit to/from stand Lower Body Dressing Details (indicate cue type and reason): Assist fort socks and STS Toilet Transfer: Minimal assistance;Rolling walker (2 wheels);+2 for physical assistance;+2 for safety/equipment           Functional mobility during ADLs: Minimal assistance;Rolling walker (2 wheels);+2 for physical assistance;+2 for safety/equipment      Extremity/Trunk Assessment Upper Extremity Assessment Upper Extremity Assessment: Overall WFL for tasks assessed   Lower Extremity Assessment Lower Extremity Assessment: Defer to PT evaluation                 Communication Communication Communication: Impaired Factors Affecting Communication: Hearing impaired;Difficulty expressing self   Cognition Arousal: Alert Behavior During Therapy: Flat affect Cognition: Cognition impaired     Awareness: Online awareness impaired Memory impairment (select all impairments): Working civil service fast streamer, Conservation officer, historic buildings Attention impairment (select first level  of impairment):  Selective attention Executive functioning impairment (select all impairments): Problem solving, Reasoning OT - Cognition Comments: At times pt can perseverate and show poor impulse control with fatigue, decreased, poor problem solving         Following commands: Impaired Following commands impaired: Follows one step commands with increased time      Cueing   Cueing Techniques: Verbal cues, Tactile cues        General Comments VSS on RA    Pertinent Vitals/ Pain       Pain Assessment Pain Assessment: Faces Faces Pain Scale: Hurts little more Pain Location: abdomen Pain Descriptors / Indicators: Discomfort, Guarding Pain Intervention(s): Monitored during session   Frequency  Min 2X/week        Progress Toward Goals  OT Goals(current goals can now be found in the care plan section)  Progress towards OT goals: Progressing toward goals  ADL Goals Pt Will Perform Eating: with set-up;sitting Pt Will Perform Grooming: with set-up;sitting Pt Will Perform Lower Body Dressing: with mod assist;sit to/from stand Pt Will Transfer to Toilet: with max assist;stand pivot transfer;squat pivot transfer;bedside commode  Plan      Co-evaluation    PT/OT/SLP Co-Evaluation/Treatment: Yes Reason for Co-Treatment: Complexity of the patient's impairments (multi-system involvement);For patient/therapist safety;To address functional/ADL transfers;Necessary to address cognition/behavior during functional activity   OT goals addressed during session: ADL's and self-care;Strengthening/ROM      AM-PAC OT 6 Clicks Daily Activity     Outcome Measure   Help from another person eating meals?: None Help from another person taking care of personal grooming?: A Little Help from another person toileting, which includes using toliet, bedpan, or urinal?: A Lot Help from another person bathing (including washing, rinsing, drying)?: A Lot Help from another person to put on and taking off regular  upper body clothing?: A Little Help from another person to put on and taking off regular lower body clothing?: A Lot 6 Click Score: 16    End of Session Equipment Utilized During Treatment: Rolling walker (2 wheels)  OT Visit Diagnosis: Muscle weakness (generalized) (M62.81);Other symptoms and signs involving cognitive function;Unsteadiness on feet (R26.81)   Activity Tolerance Patient tolerated treatment well   Patient Left in chair;with call bell/phone within reach;with chair alarm set   Nurse Communication Mobility status        Time: 8964-8892 OT Time Calculation (min): 32 min  Charges: OT General Charges $OT Visit: 1 Visit OT Treatments $Self Care/Home Management : 8-22 mins  Adrianne BROCKS, OT  Acute Rehabilitation Services Office [phone] Secure chat preferred   Adrianne GORMAN Savers 04/03/2024, 11:43 AM

## 2024-04-03 NOTE — Progress Notes (Addendum)
 Patient ID: Billy [name], male   DOB: 05/11/1950, 73 y.o.   MRN: [id number]     Advanced Heart Failure Rounding Note  Cardiologist: Dorn Lesches, MD  Chief Complaint: S/p esophageal stent. AHF reconsulted 12/5 for A/C HFpEF.  Patient Profile  Patient with longstanding history of chronic heart failure with preserved ejection fraction, RV failure with history of pulmonary embolism presents with esophageal perforation.  S/P Stent Esophageal Stent. Stent repositioned 10/15. AHF signed off 11/10. Re-consulted 03/22/24 with A/C HFpEF.  Significant events:    - 10/6: esoph stent placement - 10/15: S/p EGD with stent repositioning by Dr. Shyrl  - 10/23: Ltd echo EF 60-65%  - 10/24: Barium swallow showed persistent leak  - 11/3: PEG tube placed - 11/11: L CT removed - 11/19: SVT in cephalic vein - 11/26: Stent removed. Esophagram (-) for esophageal leak - 11/28: Duodenitis/Enteritis. GI consulted. + bloody emesis.  - 12/2: EDG w/ ischemic ulcers. Carafate  increased + BID PPI - 12/4: PCCM consulted for increased WOB and progressive hypoxia.  - 12/5: AHF re consulted for A/C HFpEF - 12/10: Transferred back to ICU with acute respiratory distress, reintubated. Concern for drug-induced lupus 2/2 hydralazine . CRRT started.  Echo showed EF 60-65% with basal inferior akinesis, moderate LVH, normal RV size and mildly decreased systolic function.  Subjective:    Has had long, complicated hospitalization. Day: 73  CVP 3.   BP stable today off cleviprex , on minoxidil  10 mg daily.   In atrial flutter morning of 12/15. Started on amiodarone  gtt. Remains in flutter but rates controlled.   He is on Solumedrol 60 mg IV daily.   Completed linezolid  for MRSA in sputum from 12/10.    Feels fine this morning. Denies CP/SOB.   Objective:    Weight Range: 76.8 kg Body mass index is 22.34 kg/m.   Vital Signs:   Temp:  [97.7 F (36.5 C)-99.9 F (37.7 C)] 99.5 F (37.5 C) (12/17 0700) Pulse Rate:   [57-115] 111 (12/17 0700) Resp:  [11-36] 25 (12/17 0700) BP: (107-162)/(44-80) 117/44 (12/17 0600) SpO2:  [93 %-100 %] 100 % (12/17 0700) Arterial Line BP: (105-187)/(42-83) 187/83 (12/17 0700) Weight:  [76.8 kg] 76.8 kg (12/17 0600) Last BM Date : 04/01/24  Weight change: Filed Weights   04/01/24 0500 04/02/24 0500 04/03/24 0600  Weight: 76.7 kg 77 kg 76.8 kg   Intake/Output:  Intake/Output Summary (Last 24 hours) at 04/03/2024 0941 Last data filed at 04/03/2024 0900 Gross per 24 hour  Intake 988.1 ml  Output 90 ml  Net 898.1 ml    Physical Exam   General:  chronically ill / elderly appearing.  No respiratory difficulty Neck: JVD ~5 cm. RIJ HD cath Cor: Regular rate & irregular rhythm.   Lungs: clear, diminished bases Extremities: trace thigh edema. + UNNA boots GU: + foley Neuro: alert & oriented x 3. Affect flat.   Telemetry   Atrial flutter 70s-80s (Personally reviewed)    Labs    CBC Recent Labs    04/03/24 0517 04/03/24 0816  WBC 16.1* 17.3*  HGB 8.3* 8.0*  HCT 26.1* 24.7*  MCV 83.1 83.4  PLT 233 221   Basic Metabolic Panel Recent Labs    87/83/74 0425 04/03/24 0517  NA 130* 133*  K 4.4 4.9  CL 97* 98  CO2 25 26  GLUCOSE 213* 153*  BUN 26* 53*  CREATININE 1.13 2.57*  CALCIUM  7.2* 7.9*  MG 2.4 2.5*  PHOS 2.5 3.6   Liver Function Tests Recent  Labs    04/02/24 0425 04/03/24 0517  ALBUMIN  1.6* 2.5*   BNP (last 3 results) Recent Labs    06/27/23 1116 03/22/24 0913 03/25/24 0535  BNP 646.4* 2,404.1* 831.9*   Hemoglobin A1C No results for input(s): HGBA1C in the last 72 hours.  Fasting Lipid Panel No results for input(s): CHOL, HDL, LDLCALC, TRIG, CHOLHDL, LDLDIRECT in the last 72 hours.   Medications:    Scheduled Medications:  atorvastatin   80 mg Per Tube Daily   Chlorhexidine  Gluconate Cloth  6 each Topical Daily   clonazePAM   0.5 mg Per Tube BID   ezetimibe   10 mg Per Tube Daily   feeding supplement  (PROSource TF20)  60 mL Per Tube BID   fiber  1 packet Per Tube BID   [name] butt cream   Topical BID   guaiFENesin   10 mL Per Tube Q4H   insulin  aspart  0-9 Units Subcutaneous Q4H   insulin  glargine  10 Units Subcutaneous Daily   lidocaine   1 patch Transdermal Q24H   methylPREDNISolone  (SOLU-MEDROL ) injection  60 mg Intravenous Daily   minoxidil   10 mg Per Tube Daily   multivitamin  1 tablet Per Tube QHS   oxidized cellulose  1 each Topical Once   pantoprazole  (PROTONIX ) IV  40 mg Intravenous Q12H   QUEtiapine   25 mg Per Tube BID   saccharomyces boulardii  250 mg Per Tube BID   sodium chloride  flush  10-40 mL Intracatheter Q12H    Infusions:  amiodarone  30 mg/hr (04/03/24 0700)   famotidine  (PEPCID ) IV     feeding supplement (VITAL 1.5 CAL) 35 mL/hr at 04/03/24 0700   promethazine  (PHENERGAN ) injection (IM or IVPB) Stopped (03/18/24 0911)    PRN Medications: acetaminophen  (TYLENOL ) oral liquid 160 mg/5 mL, alum & mag hydroxide-simeth, diclofenac  Sodium, [COMPLETED] diphenoxylate -atropine  **FOLLOWED BY** diphenoxylate -atropine , docusate, famotidine  (PEPCID ) IV, glucagon  (human recombinant), HYDROmorphone  (DILAUDID ) injection, hydrOXYzine , lip balm, mouth rinse, oxyCODONE , polyethylene glycol, promethazine  (PHENERGAN ) injection (IM or IVPB), simethicone , sodium chloride , sodium chloride  flush, traZODone   Assessment/Plan  1.  Esophageal Perforation - Boerhaave syndrome.  - CT C/A/P: with pneumomediastinum and large mass-like density in lower esophagus - 10/6: EGD with esophageal stent placed  - 10/15: Stent migrated w/ leak > back to OR for repositioning  - 10/24: barium swallow w persistent leak  - 11/3: PEG placed - on TF and meds by PEG.  - 11/26: Stent removed. Esophagram (-) for esophageal leak - 12/2: EDG w/ ischemic ulcers. Carafate  increased + BID PPI - Tolerating diet  2. Acute on chronic HFpEF - h/o cardiogenic shock with RV failure in the setting of cardiac  arrest thought to be caused by acute PE vs ACS in 3/25. Echo at the time showed EF 55%, severe, LVH, G2DD, and severely reduced RV function with strain. There was concern for cardiac sarcoid amyloid, however CMR LGE consistent with ischemic disease. RV on echo 9/25 with complete recovery, EF 60-65%, nl RV function.    - RHC 3/25: Mild PAH with severe RV failure though CO is preserved.   - Echo 10/25 EF 60-65%, RV normal.  - Echo 12/10: EF 60-65% with basal inferior akinesis, moderate LVH, normal RV size and mildly decreased systolic function.  - Off pressors and now on clevidipine  for HTN.   - CVP 3. Nephrology managing volume removal. Plan for iHD possibly tomorrow. CRRT stopped 12/16 - Co-ox 70%.   3. CAD:  - LHC 3/25: 3v CAD with CTO RCA with L->R collaterals and  moderate non-obstructive CAD in L system. Medical management.  - suspect CP related to esophageal issues and not CAD - No chest pain.     4.  AKI on CKD Stage 3b: Baseline sCr ~ 2.1. - Worsening AKI> CRRT - L RAS.   - Now off CRRT. Plan for iHD possibly tomorrow.   5. PAF:   - s/p TEE/DCCV 3/25 - Still in atrial flutter this morning but rates controlled on IV amiodarone . Will switch to PO today, 200 mg BID.  - Restart hep gtt  6.  H/o DVT/PE:  Bilateral upper and lower DVTs, mixed acute and chronic.  - s/p infrarenal IVC placement 3/25 - V/Q 3/25 with multiple perfusion defects - Heparin  drip    7. Carotid stenosis: h/o CVA. TCAR in 6/24. Okay to remain off plavix  at this point. - carotid US  9/25: widely patent carotid stent on R and no stenosis on L   8. Acute Hypoxic Respiratory Failure/PNA/ Exudative Lt Pleural Effusion  - CT placed for pleural effusion. Resp Cx w/ rare yeast, BCx NGTD  - Re-intubated 12/10 per CCM.  - Extubated 12/12.   9. Severe protein calorie malnutrition - Nutrition following. PEG tube + TF  10. HTN - Stable off Clevidipine   - He has had problems with both hydralazine  and amlodipine .   -  Continue minoxidil  to 10 mg daily  11. Superficial LUE thrombus - Continue PICC - Heparin  gtt  12. Petechial Rash - With diffuse petechial rash, ANA+, anti-histone ab+, and low C4, there is concern for possible drug-induced lupus from hydralazine  with AKI.  ANCA negative.  - Discussed with nephrology and CCM, would ideally get a renal biopsy.  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  Not ideal with friable esophagus but tolerating so far.  - To IR 12/16 for renal biopsy. Results pending.   13. Anemia - Hgb 11.2>9>8, had transfusion 12/12.   Stable for floor transfer today.   Beckey LITTIE Coe 04/03/2024 9:41 AM  Patient seen with NP, I formulated the plan and agree with the above note.    CVP 3 now, minimal UOP.  Renal biopsy yesterday, remains on Solumedrol.   Now off clevidipine , BP controlled with minoxidil .   General: NAD Neck: No JVD, no thyromegaly or thyroid  nodule.  Lungs: Clear to auscultation bilaterally with normal respiratory effort. CV: Nondisplaced PMI.  Heart regular S1/S2, no S3/S4, no murmur.  No peripheral edema.    Abdomen: Soft, nontender, no hepatosplenomegaly, no distention.  Skin: Petechial rash arms/legs improving.  Neurologic: Alert and oriented x 3.  Psych: Normal affect. Extremities: No clubbing or cyanosis.  HEENT: Normal.   Completed linezolid  for MRSA on BAL.    Volume status looks good this morning, CVP < 5.  Plan for iHD tomorrow.     See discussion above, concern for drug-induced lupus with AKI.  He has been on Solumedrol.   - Renal biopsy results pending. - Start to wean Solumedrol.    He is in atrial fibrillation today.  On heparin  gtt until procedures completed.  Can transition to po amiodarone  200 bid.     We have been able to stop clevidipine , BP controlled on minoxidil .  He has a very limited range of BP meds that he can tolerate as noted above.   Cardiology will  sign off when he leaves the unit.   Ezra Shuck 04/03/2024 12:26 PM

## 2024-04-03 NOTE — Consult Note (Addendum)
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 TUBES AND DEVICES: Stable right upper extremity PICC line. Left chest loop recorder device noted. LUNGS AND PLEURA: Increased patchy bilateral airspace opacities. Small bilateral pleural effusions, unchanged. No pneumothorax. HEART AND MEDIASTINUM: Aortic atherosclerosis. No acute abnormality of the cardiac and mediastinal silhouettes. BONES AND SOFT TISSUES: No acute osseous abnormality. IMPRESSION: 1. Increased patchy bilateral airspace opacities. 2. Small bilateral pleural effusions, unchanged. Electronically signed by: Evalene Coho MD 03/27/2024 08:26 AM EST RP Workstation: [id number]   US  RENAL Result Date: 03/26/2024 CLINICAL DATA:  Acute renal insufficiency EXAM: RENAL / URINARY TRACT ULTRASOUND COMPLETE COMPARISON:  03/24/2024 FINDINGS: Right Kidney: Renal measurements: 12.4 x 6.3 x 5.2 cm = volume: 212.3 mL. Echogenicity within normal limits. No mass or hydronephrosis visualized. Left Kidney: Renal measurements: 8.9 x 4.7 x 2.9 cm = volume: 63.9 mL. Marked left renal cortical atrophy with increased echotexture compatible with medical renal disease. No hydronephrosis or renal mass. Bladder: Bladder is decompressed, limiting its evaluation. Other: There is small volume ascites within the abdomen and pelvis. Gallbladder sludge  is incidentally noted, with nonspecific gallbladder wall thickening measuring up to 7 mm. IMPRESSION: 1. Chronic left renal cortical atrophy. 2. Unremarkable right kidney. 3. Ascites, unchanged since recent CT. 4. Biliary sludge, with nonspecific gallbladder wall thickening given the adjacent ascites. Electronically Signed   By: Ozell Daring M.D.   On: 03/26/2024 18:43   DG CHEST PORT 1 VIEW Result Date: 03/25/2024 CLINICAL DATA:  Shortness of breath. EXAM: PORTABLE CHEST 1 VIEW COMPARISON:  03/22/2024 FINDINGS: The cardio pericardial silhouette is enlarged. Asymmetric patchy airspace disease is similar to prior. Right PICC line tip overlies the mid SVC level. Stable bilateral effusions. IMPRESSION: No substantial interval change. Asymmetric patchy airspace disease with bilateral effusions. Electronically Signed   By: Camellia Candle M.D.   On: 03/25/2024 05:41   CT ABDOMEN PELVIS WO CONTRAST Addendum Date: 03/24/2024 ADDENDUM #1  ADDENDUM: typo- within lower chest findings that should state interval worsening of bibasilar patchy peribronchovascular ground-glass airspace opacities  instead of air bronchograms. Impression should mention this finding as well as bilateral pleural effusions. ---------------------------------------------------- Electronically signed by: Morgane Naveau MD 03/24/2024 08:29 PM  EST RP Workstation: [id number]   Result Date: 03/24/2024  ORIGINAL REPORT  EXAM: CT ABDOMEN AND PELVIS WITHOUT CONTRAST 03/24/2024 06:13:01 PM TECHNIQUE: CT of the abdomen and pelvis was performed without the administration of intravenous contrast. Multiplanar reformatted images are provided for review. Automated exposure control, iterative reconstruction, and/or weight-based adjustment of the mA/kV was utilized to reduce the radiation dose to as low as reasonably achievable. COMPARISON: CT abdomen and pelvis 03/18/2024 CLINICAL HISTORY: Abdominal pain, acute, nonlocalized. FINDINGS: LOWER CHEST: Persistent  bilateral trace of small volume pleural effusions. Interval worsening of bibasilar patchy air bronchograms with ground-glass airspace opacities and interlobular septal wall thickening underlying. Left ventricular hypertrophy. Cardiac findings suggestive of anemia. Coronary artery calcifications. Trace hiatal hernia. LIVER: The liver is unremarkable. GALLBLADDER AND BILE DUCTS: Gallbladder is unremarkable. No biliary ductal dilatation. SPLEEN: No acute abnormality. PANCREAS: No acute abnormality. ADRENAL GLANDS: No acute abnormality. KIDNEYS, URETERS AND BLADDER: No stones in the kidneys or ureters. No hydronephrosis. No perinephric or periureteral stranding. Urinary bladder is unremarkable. GI AND BOWEL: Stomach demonstrates no acute abnormality. Gastrostomy tube in appropriate position. Persistent circumferential bowel thickening of the second, third, fourth portion of the duodenum. Associated surrounding fat stranding. Terminal ileum bowel wall thickening circumferentially. Associated fat stranding along the terminal ileum. No small bowel dilatation. No pneumatosis. PO contrast reaches the colon. No large bowel thickening or dilatation. The appendix is unremarkable. PERITONEUM AND RETROPERITONEUM: Interval increase in small volume simple free fluid consistent with ascites. No free air. VASCULATURE: Aorta is normal in caliber. Severe atherosclerotic plaque of the abdominal aorta. IVC filter in appropriate position. LYMPH NODES: No lymphadenopathy. REPRODUCTIVE ORGANS: No acute abnormality. BONES AND SOFT TISSUES: Degenerative changes of the spine. Posterior disc osteophyte complex formation at the L4-L5 level. No acute osseous abnormality. No focal soft tissue abnormality. IMPRESSION: 1. Persistent duodenitis and interval development of terminal ileitis. No findings of bowel obstruction or perforation. 2. Interval increase in small-volume simple ascites. 3. Limited evaluation on this noncontrast study.  Electronically signed by: Morgane Naveau MD 03/24/2024 08:25 PM EST RP Workstation: [id number]   DG Chest Port 1 View Result Date: 03/22/2024 EXAM: 1 VIEW(S) XRAY OF THE CHEST 03/22/2024 12:10:00 PM COMPARISON: 03/21/2024 CLINICAL HISTORY: Respiratory failure (HCC) FINDINGS: LINES, TUBES AND DEVICES: Right upper extremity PICC in place with tip in mid superior vena cava. Implanted cardiac monitoring device in left chest. LUNGS AND PLEURA: Worsening patchy airspace opacities in right lung and right pleural effusion. Patchy airspace opacities in left lung base. No pneumothorax. HEART AND MEDIASTINUM: Aortic arch atherosclerosis. BONES AND SOFT TISSUES: No acute osseous abnormality. IMPRESSION: 1. Worsening patchy airspace opacities in the right lung and right pleural effusion is most concerning for infection. Edema could have a similar appearance . 2. Patchy airspace opacities in the left lung base are stable . 3. Stable small left pleural effusion. Electronically signed by: Lonni Necessary MD 03/22/2024 04:42 PM EST RP Workstation: [id number]   DG CHEST PORT 1 VIEW Result Date: 03/21/2024 CLINICAL DATA:  Shortness of breath. EXAM: PORTABLE CHEST 1 VIEW COMPARISON:  Radiograph 03/09/2024, chest CT 03/15/2024 FINDINGS: Stable positioning of right upper extremity PICC. Stable heart size and mediastinal contours. Bilateral pleural effusions. Patchy opacity in the right mid, upper and lower lung zone as well as left lung base has worsened from prior radiograph. No pneumothorax. Left chest wall loop recorder in place. IMPRESSION: Bilateral pleural effusions. Patchy opacity in the right mid, upper and lower lung zone as well as left lung base has worsened  from prior radiograph, suspicious for pneumonia. Electronically Signed   By: Andrea Gasman M.D.   On: 03/21/2024 14:56   CT ABDOMEN PELVIS W CONTRAST Result Date: 03/18/2024 CLINICAL DATA:  Provided history: Abdominal pain, acute (Ped 0-17y) EXAM: CT  ABDOMEN AND PELVIS WITH CONTRAST TECHNIQUE: Multidetector CT imaging of the abdomen and pelvis was performed using the standard protocol following bolus administration of intravenous contrast. RADIATION DOSE REDUCTION: This exam was performed according to the departmental dose-optimization program which includes automated exposure control, adjustment of the mA and/or kV according to patient size and/or use of iterative reconstruction technique. CONTRAST:  75mL OMNIPAQUE  IOHEXOL  350 MG/ML SOLN COMPARISON:  CT 3 days ago 03/15/2024 FINDINGS: Lower chest: Small bilateral pleural effusions, minimally diminished from recent prior. Chronic lung disease in the bases. Please reference recent chest CT for complete thoracic assessment. Pocket of gas adjacent to the left side of the mid/distal esophagus is only partially included in the field of view, possibly diminished from recent prior. Hepatobiliary: No focal liver abnormality. Unremarkable appearance of the gallbladder. No biliary dilatation. Pancreas: No evidence of pancreatic inflammation. Assessment of the pancreatic head is again limited due to adjacent duodenal wall thickening. Spleen: Normal in size without focal abnormality. Adrenals/Urinary Tract: Normal adrenal glands. Chronic left renal atrophy. No hydronephrosis or focal renal abnormality of the right kidney. Unremarkable appearance of the urinary bladder. Stomach/Bowel: Gastrostomy tube in the stomach. Wall thickening of the distal esophagus which contains a small amount of enteric contrast. The stomach is physiologically distended. There is severe circumferential wall thickening about the transverse duodenum and proximal jejunum. No obstruction with enteric contrast reaching the colon. No new areas of small bowel wall thickening. The appendix is normal. No colonic inflammatory change. No bowel pneumatosis. Vascular/Lymphatic: Aortic atherosclerosis. Infrarenal IVC filter in place. Small upper abdominal lymph  nodes are not enlarged by size criteria. Reproductive: Prostate is unremarkable. Other: Edema in the upper abdomen adjacent to proximal small bowel wall thickening. Small amount of free fluid adjacent to the liver, increased. Small amount of free fluid in the right pericolic gutter tracking into the pelvis. No free air or focal fluid collection. Patulous right inguinal canal. Generalized subcutaneous edema. Musculoskeletal: Stable degenerative change in the spine. IMPRESSION: 1. Persistent severe circumferential wall thickening about the duodenum and proximal jejunum, suspicious for enteritis. No obstruction with enteric contrast reaching the colon. 2. Small amount of free fluid in the abdomen and pelvis, increased over the last 3 days. 3. Small bilateral pleural effusions, minimally diminished. 4. Contrast in the distal esophagus can be seen in the setting of reflux. The small pocket of gas adjacent to the distal esophagus is only partially included in the field of view, possibly diminished from recent prior. Aortic Atherosclerosis (ICD10-I70.0). Electronically Signed   By: Andrea Gasman M.D.   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 Status post stent placement and removal. He presents for water -soluble UGI due to abdominal pain and emesis. CT on 03/15/24 demonstrated wall thickening involving the duodenum and proximal jejunum. EXAM: DG UGI W SINGLE CM TECHNIQUE: Scout radiograph was obtained. Single contrast examination was performed using 200 mL of water -soluble contrast. 150 mL of the water -soluble contrast was given orally, 50 mL of the water -soluble contrast was given via the G-tube. The G tube was flushed with 10 mL of water  at the end of the exam. This exam was  performed by Aimee Han, PA-C, and was supervised and interpreted by [name] Balder, MD. FLUOROSCOPY: Radiation Exposure Index (as  provided by the fluoroscopic device): 39.5 mGy Kerma COMPARISON:  CT chest abdomen without contrast on 03/15/2024, water -soluble esophagram on 02/09/2024, water  soluble esophagram on 01/31/2024, water -soluble esophagus on 01/23/2024 FINDINGS: Scout Radiograph: Scout image demonstrates an IVC filter and gastrostomy tube. Nonobstructive bowel gas pattern with gas in the colon. Central line tip in the SVC. Esophagus: Esophagus is patent with mild narrowing and irregularity in the mid esophagus. No evidence for an esophageal perforation. Mild dilatation of the distal esophagus with small amount of debris or filling defects in the distal esophagus near the GE junction. Esophageal motility: Esophageal motility was not evaluated formally. Proximal escape seen. Gastroesophageal reflux:  Not examined. Ingested 13mm barium tablet:  Not given. Stomach: Gastrostomy tube in place. Normal appearance of the stomach. Gastric emptying: Normal. Duodenum: Circumferential narrowing in the distal duodenum and proximal jejunum. Findings correspond with recent CT findings. Other:  Mildly dilated loops of proximal jejunum. IMPRESSION: 1. Esophagus is patent and no evidence for a perforation. 2. Mild irregularity in the mid esophagus possibly related to previous stent. 3. Diffuse narrowing of the lumen in the distal duodenum and proximal jejunum. Findings are suggestive for underlying wall thickening in this area. Distribution of disease is similar to the prior CT from 03/15/2024. Electronically Signed   By: [name] Balder M.D.   On: 03/18/2024 16:52   DG Abd 1 View Result Date: 03/18/2024 EXAM: 1 VIEW XRAY OF THE ABDOMEN 03/18/2024 07:43:00 AM COMPARISON: 03/15/2024 CLINICAL HISTORY: Abdominal pain FINDINGS: LINES, TUBES AND DEVICES: Percutaneous gastrostomy tube in place. Unchanged IVC filter in place. BOWEL: Nonobstructive bowel gas pattern. SOFT TISSUES: No opaque urinary calculi. BONES: No acute osseous abnormality. IMPRESSION: 1. No  acute findings. 2. Percutaneous gastrostomy tube in expected position. 3. Unchanged inferior vena cava filter in place. Electronically signed by: Waddell Calk MD 03/18/2024 08:06 AM EST RP Workstation: [id number]   CT CHEST ABDOMEN PELVIS WO CONTRAST Result Date: 03/15/2024 CLINICAL DATA:  Chest pain, nonspecific. Emesis. History of esophageal tear. EXAM: CT CHEST, ABDOMEN AND PELVIS WITHOUT CONTRAST TECHNIQUE: Multidetector CT imaging of the chest, abdomen and pelvis was performed following the standard protocol without IV contrast. RADIATION DOSE REDUCTION: This exam was performed according to the departmental dose-optimization program which includes automated exposure control, adjustment of the mA and/or kV according to patient size and/or use of iterative reconstruction technique. COMPARISON:  CT chest 03/10/2024 and 01/21/2024 and chest CT 06/29/2017 FINDINGS: CT CHEST FINDINGS Cardiovascular: Atherosclerotic calcifications involving the thoracic aorta. Coronary artery calcifications. Heart size is within normal limits. Minimal pericardial fluid. Right arm PICC line with the tip in the SVC. Mediastinum/Nodes: Esophageal stent has been removed in the interim. Again noted is pocket of gas along the left side of the mid/distal esophagus. Limited evaluation of the esophagus without IV or oral contrast. No new mediastinal air. Surgical clips on the left side of the neck. No significant chest lymphadenopathy. Lungs/Pleura: Again noted are small bilateral pleural effusions that have not significantly changed.Patchy parenchymal densities in the right middle lobe and right lower lobe are again noted. Findings are concerning for areas of atelectasis and infection. Again noted are multiple nodular densities throughout the right lung. Poorly defined nodular density in the right lower lobe on image 105, sequence 5 measures 9 mm. Small nodular densities in the right lower lobe superior segment. Largest nodule in the  right upper lobe measures 8 mm  on image 69/5. These nodular densities are new since 2019. Small amount of fluid along the left major fissure. Volume loss in left lower lobe. Musculoskeletal: No acute bone abnormality in the chest. Prior sternal fracture. CT ABDOMEN PELVIS FINDINGS Hepatobiliary: Normal appearance of the liver and gallbladder. No significant biliary dilatation. Pancreas: No acute abnormality involving the pancreas but limited evaluation of the pancreatic head due to extensive wall thickening inflammation involving the duodenum. Spleen: Normal in size without focal abnormality. Adrenals/Urinary Tract: Normal adrenal glands. Atrophic left kidney. Normal appearance of the right kidney without hydronephrosis. Normal appearance of the urinary bladder. Stomach/Bowel: Oral contrast in the colon. Gastrostomy tube is in place. Diffuse thickening of the transverse segment of the duodenum and proximal jejunum. This represents a marked change since 01/21/2024. Distal small bowel is decompressed. Question mild wall thickening in the terminal ileum. No evidence for bowel obstruction. Vascular/Lymphatic: IVC filter. Extensive atherosclerotic disease in the abdominal aorta without aneurysm. No significant lymph node enlargement in the abdomen or pelvis. Reproductive: Prostate is unremarkable. Other: Negative for ascites. Negative for free air. Small umbilical hernia containing fat. Small amount of presacral edema. Musculoskeletal: Disc space narrowing at L4-L5. Multilevel degenerative changes in lumbar spine. No acute bone abnormality. IMPRESSION: 1. Severe wall thickening involving the duodenum and proximal jejunum. These findings are new and concerning for severe enteritis. Etiology is uncertain. 2. Interval removal of the esophageal stent. Again noted is a pocket of gas along the left side of the mid/distal esophagus. No new mediastinal air. 3. Persistent small bilateral pleural effusions. 4. Patchy parenchymal  densities in the right middle lobe and right lower lobe are concerning for areas of atelectasis and infection. 5. Multiple nodular densities in the right lung. These are new since 2019 and will need follow-up to exclude a neoplastic process. 6. Question mild wall thickening in the terminal ileum. 7. Atrophic left kidney. 8.  Aortic Atherosclerosis (ICD10-I70.0). Electronically Signed   By: [name] Balder M.D.   On: 03/15/2024 09:09   DG Abd 1 View Result Date: 03/15/2024 EXAM: 1 VIEW XRAY OF THE ABDOMEN 03/15/2024 05:59:00 AM COMPARISON: KUB dated 03/12/2024. CLINICAL HISTORY: Abdominal pain. FINDINGS: LINES, TUBES AND DEVICES: A gastrostomy tube is now present and appears to be in an expected position. An IVC filter is also again noted. LUNGS: There are coarse opacities present within the lung bases. BOWEL: The abdominal bowel gas pattern is unremarkable. SOFT TISSUES: No opaque urinary calculi. BONES: No acute osseous abnormality. IMPRESSION: 1. No acute findings. 2. Gastrostomy tube in expected position. 3. IVC filter in situ. Electronically signed by: Evalene Coho MD 03/15/2024 06:04 AM EST RP Workstation: [id number]   DG C-Arm 1-60 Min Result Date: 03/13/2024 CLINICAL DATA:  Upper endoscopy with stent removal EXAM: DG C-ARM 1-60 MIN COMPARISON:  03/10/2024 FINDINGS: A single fluoroscopic image was obtained during the performance of the procedure and is provided for interpretation only. Images demonstrate opacification of the distal thoracic esophagus. The previously identified radiopaque stent is not visualized. Please refer to operative report. Fluoroscopy time: 40 seconds, 5.10 mGy IMPRESSION: 1. Intraoperative exam as above. Please refer to the operative report. Electronically Signed   By: Ozell Daring M.D.   On: 03/13/2024 20:40   DG ABDOMEN PEG TUBE LOCATION Result Date: 03/12/2024 CLINICAL DATA:  Peg tube placement. EXAM: ABDOMEN - 1 VIEW COMPARISON:  01/31/2024 FINDINGS: Interval oral  contrast in the stomach, duodenum and proximal jejunum. No contrast extravasation seen. The PEG tube is not visualized. Stable inferior vena  cava filter. Normal bowel-gas pattern. Lumbar spine degenerative changes. IMPRESSION: PEG tube in the stomach with no contrast extravasation seen. Electronically Signed   By: Elspeth Bathe M.D.   On: 03/12/2024 15:39   CT CHEST WO CONTRAST Result Date: 03/10/2024 CLINICAL DATA:  Esophageal mass. EXAM: CT CHEST WITHOUT CONTRAST TECHNIQUE: Multidetector CT imaging of the chest was performed following the standard protocol without IV contrast. RADIATION DOSE REDUCTION: This exam was performed according to the departmental dose-optimization program which includes automated exposure control, adjustment of the mA and/or kV according to patient size and/or use of iterative reconstruction technique. COMPARISON:  Chest CT dated 01/21/2024. FINDINGS: Evaluation of this exam is limited in the absence of intravenous contrast. Cardiovascular: Mild cardiomegaly. No pericardial effusion. There is coronary vascular calcification of the LAD. Mild atherosclerotic calcification of the thoracic aorta. No intimal dilatation. The central pulmonary arteries are grossly unremarkable. Right-sided PICC with tip in the central SVC. Mediastinum/Nodes: No obvious hilar adenopathy. A cluster of small rounded nodules/lymph nodes in the aortopulmonic window. An esophageal stent is noted. The lumen of the stent is filled with content with similar attenuation as the esophagus. No mediastinal fluid collection. Lungs/Pleura: Small bilateral pleural effusions. Bibasilar linear and streaky atelectasis. Pneumonia is not excluded. Small fluid noted along the left fissure. Scattered clusters of nodular density predominantly in the right upper and right lower lobes with the largest nodule measuring 11 mm (72/5) as seen previously. Findings may be inflammatory/infectious in etiology or related to aspiration.  Metastatic disease is not excluded. No pneumothorax. The central airways are patent. Upper Abdomen: Percutaneous gastrostomy with balloon in the body of the stomach. Musculoskeletal: No acute osseous pathology. IMPRESSION: 1. Esophageal stent in place.  No mediastinal fluid collection. 2. Small bilateral pleural effusions with bibasilar atelectasis. Pneumonia is not excluded. 3. Scattered clusters of nodular density predominantly in the right upper and right lower lobes with the largest nodule measuring 11 mm as seen previously. Findings may be inflammatory/infectious in etiology or related to aspiration. Metastatic disease is not excluded. 4.  Aortic Atherosclerosis (ICD10-I70.0). Electronically Signed   By: Vanetta Chou M.D.   On: 03/10/2024 11:58   DG CHEST PORT 1 VIEW Result Date: 03/09/2024 EXAM: 1 VIEW(S) XRAY OF THE CHEST 03/09/2024 06:46:17 PM COMPARISON: None available. CLINICAL HISTORY: PNA (pneumonia). FINDINGS: LINES, TUBES AND DEVICES: The loop recorder is present. Esophageal stent along the midline. Right PICC line with tip over the cavoatrial junction region. LUNGS AND PLEURA: Patchy coarse infiltrates in the lung bases could represent pneumonia or aspiration. Small bilateral pleural effusions. No pneumothorax. HEART AND MEDIASTINUM: Mild cardiac enlargement. Calcification of the aorta. BONES AND SOFT TISSUES: No acute osseous abnormality. IMPRESSION: 1. Patchy coarse bibasilar airspace opacities, which may represent pneumonia or aspiration. 2. Small bilateral pleural effusions. 3. No pneumothorax. Electronically signed by: Elsie Gravely MD 03/09/2024 07:02 PM EST RP Workstation: [id number]   VAS US  UPPER EXTREMITY VENOUS DUPLEX Result Date: 03/07/2024 UPPER VENOUS STUDY  Patient Name:  Billy [name]  Date of Exam:   03/07/2024 Medical Rec #: [id number]   Accession #:    [id number] Date of Birth: Apr 23, 1950   Patient Gender: M Patient Age:   44 years Exam Location:  [facility]  Procedure:      VAS US  UPPER EXTREMITY VENOUS DUPLEX Referring Phys: REDIA CLEAVER --------------------------------------------------------------------------------  Indications: Edema, and PICC line. Other Indications: H.O DVT. Comparison Study: Previous study on 3.18.2025. Performing Technologist: Edilia Elden Appl  Examination Guidelines: A complete evaluation includes  B-mode imaging, spectral Doppler, color Doppler, and power Doppler as needed of all accessible portions of each vessel. Bilateral testing is considered an integral part of a complete examination. Limited examinations for reoccurring indications may be performed as noted.  Right Findings: +----------+------------+---------+-----------+----------+--------------+ RIGHT     CompressiblePhasicitySpontaneousProperties   Summary     +----------+------------+---------+-----------+----------+--------------+ IJV           Full       Yes       Yes                             +----------+------------+---------+-----------+----------+--------------+ Subclavian               Yes       Yes                             +----------+------------+---------+-----------+----------+--------------+ Axillary      Full       Yes       Yes                 IV line.    +----------+------------+---------+-----------+----------+--------------+ Brachial      Full       Yes       Yes                 IV line.    +----------+------------+---------+-----------+----------+--------------+ Radial        Full                                                 +----------+------------+---------+-----------+----------+--------------+ Ulnar                                               Not visualized +----------+------------+---------+-----------+----------+--------------+ Cephalic      None       No        No                              +----------+------------+---------+-----------+----------+--------------+ Basilic       None        No        No                              +----------+------------+---------+-----------+----------+--------------+ Superficial vein thrombosis noted the basilic vein from the distal arm to the antecubital fossa and in the cephalic vein from the antecubital fossa to the distal forearm. Right middle brachial veins were not visualized due to IV bandages, all other segments are compressible.  Left Findings: +----------+------------+---------+-----------+----------+-------+ LEFT      CompressiblePhasicitySpontaneousPropertiesSummary +----------+------------+---------+-----------+----------+-------+ IJV           Full       Yes       Yes                      +----------+------------+---------+-----------+----------+-------+ Subclavian               Yes       Yes                      +----------+------------+---------+-----------+----------+-------+  Summary:  Right: Findings consistent with acute superficial vein thrombosis involving the right basilic vein and right cephalic vein.  Left: No evidence of thrombosis in the subclavian.  *See table(s) above for measurements and observations.  Diagnosing physician: Debby Robertson Electronically signed by Debby Robertson on 03/07/2024 at 8:00:10 PM.    Final     Labs:  CBC: Recent Labs    04/01/24 0428 04/02/24 0425 04/03/24 0517 04/03/24 0816  WBC 22.9* 16.3* 16.1* 17.3*  HGB 11.2* 9.0* 8.3* 8.0*  HCT 30.3* 27.6* 26.1* 24.7*  PLT 268 229 233 221    COAGS: Recent Labs    07/02/23 0136 01/21/24 1728 01/23/24 1931 02/05/24 0408 02/05/24 0800 03/29/24 1952 03/30/24 0420 04/01/24 0426 04/01/24 0650 04/02/24 0423  INR 1.3* 1.3*  --  1.8*  --   --   --   --   --  1.2  APTT  --   --    < >  --    < > 35 68* 180* 107*  --    < > = values in this interval not displayed.    BMP: Recent Labs    04/01/24 0428 04/01/24 1527 04/02/24 0425 04/03/24 0517  NA 132* 134* 130* 133*  K 4.4 4.8 4.4 4.9  CL 99 103 97* 98  CO2 25  25 25 26   GLUCOSE 95 161* 213* 153*  BUN 28* 29* 26* 53*  CALCIUM  7.4* 7.3* 7.2* 7.9*  CREATININE 1.09 1.29* 1.13 2.57*  GFRNONAA >60 59* >60 26*    LIVER FUNCTION TESTS: Recent Labs    03/21/24 0345 03/24/24 0508 03/25/24 0535 03/26/24 0809 03/27/24 0500 03/27/24 1535 04/01/24 0428 04/01/24 1527 04/02/24 0425 04/03/24 0517  BILITOT 0.5  --  0.5 0.6 0.8  --   --   --   --   --   AST 31  --  54* 50* 59*  --   --   --   --   --   ALT 68*  --  95* 70* 69*  --   --   --   --   --   ALKPHOS 63  --  80 73 81  --   --   --   --   --   PROT 4.0*  --  4.1* 4.2* 4.7*  --   --   --   --   --   ALBUMIN  <1.5*   < > <1.5* 1.5* 2.0*   < > 1.8* 1.7* 1.6* 2.5*   < > = values in this interval not displayed.    TUMOR MARKERS: No results for input(s): AFPTM, CEA, CA199, CHROMGRNA in the last 8760 hours.  Assessment and Plan:  Billy [name] is a 73 y.o. male with past medical history significant for HFrEF, Afib, PE, L-sided RAS (occlusive), cardiac arrest earlier this year and esophageal tear.  10/7 esophageal stent placed, subsequent swallow study revealed an additional leak  10/15: stent repositioned 12/2: sig inflamed mucosa and ulcers on EGD  12/10: CRRT started 12/11: Intubated upon arrival to ICU 12/12: extubated 12/16: IR renal biopsy  Work up of AKI has revealed ANA +, C4 low, + hematuria, anti-histone Ab +. He underwent uncomplicated renal biopsy with IR yesterday, with no pain or concerns related to this today. 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 Pt has required intermittent hemodialysis. Pt deemed to need ongoing dialysis. IR has now been consulted for tunneled hemodialysis catheter placement. - plan to tentatively proceed tomorrow 04/03/24 -  pt to be npo/tube feeds held at midnight  Risks and benefits discussed with the patient including, but not limited to bleeding, infection, vascular injury, pneumothorax which may require chest tube placement, air embolism  or even death. All of the patient's questions were answered, patient is agreeable to proceed. Consent signed and in chart.   Thank you for allowing our service to participate in Billy [name] 's care.  Electronically Signed: Kimble VEAR Clas, PA-C   04/03/2024, 12:01 PM      I spent a total of 20 Minutes    in face to face in clinical consultation, greater than 50% of which was counseling/coordinating care for tunneled hemodialysis catheter.

## 2024-04-03 NOTE — Plan of Care (Signed)
  Problem: Education: Goal: Knowledge of General Education information will improve Description: Including pain rating scale, medication(s)/side effects and non-pharmacologic comfort measures Outcome: Progressing   Problem: Health Behavior/Discharge Planning: Goal: Ability to manage health-related needs will improve Outcome: Progressing   Problem: Clinical Measurements: Goal: Will remain free from infection Outcome: Progressing Goal: Diagnostic test results will improve Outcome: Progressing Goal: Respiratory complications will improve Outcome: Progressing Goal: Cardiovascular complication will be avoided Outcome: Progressing   Problem: Activity: Goal: Risk for activity intolerance will decrease Outcome: Progressing   Problem: Nutrition: Goal: Adequate nutrition will be maintained Outcome: Progressing   Problem: Coping: Goal: Level of anxiety will decrease Outcome: Progressing   Problem: Elimination: Goal: Will not experience complications related to bowel motility Outcome: Progressing Goal: Will not experience complications related to urinary retention Outcome: Progressing   Problem: Pain Managment: Goal: General experience of comfort will improve and/or be controlled Outcome: Progressing   Problem: Safety: Goal: Ability to remain free from injury will improve Outcome: Progressing   Problem: Skin Integrity: Goal: Risk for impaired skin integrity will decrease Outcome: Progressing   Problem: Education: Goal: Ability to describe self-care measures that may prevent or decrease complications (Diabetes Survival Skills Education) will improve Outcome: Progressing   Problem: Coping: Goal: Ability to adjust to condition or change in health will improve Outcome: Progressing   Problem: Fluid Volume: Goal: Ability to maintain a balanced intake and output will improve Outcome: Progressing   Problem: Health Behavior/Discharge Planning: Goal: Ability to identify and  utilize available resources and services will improve Outcome: Progressing Goal: Ability to manage health-related needs will improve Outcome: Progressing   Problem: Metabolic: Goal: Ability to maintain appropriate glucose levels will improve Outcome: Progressing   Problem: Nutritional: Goal: Maintenance of adequate nutrition will improve Outcome: Progressing Goal: Progress toward achieving an optimal weight will improve Outcome: Progressing   Problem: Skin Integrity: Goal: Risk for impaired skin integrity will decrease Outcome: Progressing   Problem: Tissue Perfusion: Goal: Adequacy of tissue perfusion will improve Outcome: Progressing   Problem: Activity: Goal: Ability to tolerate increased activity will improve Outcome: Progressing   Problem: Respiratory: Goal: Ability to maintain a clear airway and adequate ventilation will improve Outcome: Progressing   Problem: Role Relationship: Goal: Method of communication will improve Outcome: Progressing   Problem: Education: Goal: Ability to describe self-care measures that may prevent or decrease complications (Diabetes Survival Skills Education) will improve Outcome: Progressing Goal: Individualized Educational Video(s) Outcome: Progressing   Problem: Coping: Goal: Ability to adjust to condition or change in health will improve Outcome: Progressing   Problem: Fluid Volume: Goal: Ability to maintain a balanced intake and output will improve Outcome: Progressing   Problem: Health Behavior/Discharge Planning: Goal: Ability to identify and utilize available resources and services will improve Outcome: Progressing Goal: Ability to manage health-related needs will improve Outcome: Progressing   Problem: Metabolic: Goal: Ability to maintain appropriate glucose levels will improve Outcome: Progressing   Problem: Nutritional: Goal: Maintenance of adequate nutrition will improve Outcome: Progressing Goal: Progress  toward achieving an optimal weight will improve Outcome: Progressing   Problem: Skin Integrity: Goal: Risk for impaired skin integrity will decrease Outcome: Progressing   Problem: Tissue Perfusion: Goal: Adequacy of tissue perfusion will improve Outcome: Progressing

## 2024-04-04 ENCOUNTER — Inpatient Hospital Stay (HOSPITAL_COMMUNITY)

## 2024-04-04 ENCOUNTER — Encounter (HOSPITAL_COMMUNITY): Payer: Self-pay

## 2024-04-04 DIAGNOSIS — Z931 Gastrostomy status: Secondary | ICD-10-CM | POA: Diagnosis not present

## 2024-04-04 DIAGNOSIS — N179 Acute kidney failure, unspecified: Secondary | ICD-10-CM | POA: Diagnosis not present

## 2024-04-04 DIAGNOSIS — I5033 Acute on chronic diastolic (congestive) heart failure: Secondary | ICD-10-CM | POA: Diagnosis not present

## 2024-04-04 DIAGNOSIS — K223 Perforation of esophagus: Secondary | ICD-10-CM | POA: Diagnosis not present

## 2024-04-04 DIAGNOSIS — N183 Chronic kidney disease, stage 3 unspecified: Secondary | ICD-10-CM | POA: Diagnosis not present

## 2024-04-04 HISTORY — PX: IR TUNNELED CENTRAL VENOUS CATH PLC W IMG: IMG1939

## 2024-04-04 LAB — COOXEMETRY PANEL
Carboxyhemoglobin: 2.2 % — ABNORMAL HIGH (ref 0.5–1.5)
Methemoglobin: 0.9 % (ref 0.0–1.5)
O2 Saturation: 75.5 %
Total hemoglobin: 8.8 g/dL — ABNORMAL LOW (ref 12.0–16.0)

## 2024-04-04 LAB — RENAL FUNCTION PANEL
Albumin: 2.5 g/dL — ABNORMAL LOW (ref 3.5–5.0)
Anion gap: 12 (ref 5–15)
BUN: 80 mg/dL — ABNORMAL HIGH (ref 8–23)
CO2: 22 mmol/L (ref 22–32)
Calcium: 7.7 mg/dL — ABNORMAL LOW (ref 8.9–10.3)
Chloride: 98 mmol/L (ref 98–111)
Creatinine, Ser: 3.63 mg/dL — ABNORMAL HIGH (ref 0.61–1.24)
GFR, Estimated: 17 mL/min — ABNORMAL LOW (ref >60)
Glucose, Bld: 123 mg/dL — ABNORMAL HIGH (ref 70–99)
Phosphorus: 4.3 mg/dL (ref 2.5–4.6)
Potassium: 5 mmol/L (ref 3.5–5.1)
Sodium: 132 mmol/L — ABNORMAL LOW (ref 135–145)

## 2024-04-04 LAB — GLUCOSE, CAPILLARY
Glucose-Capillary: 155 mg/dL — ABNORMAL HIGH (ref 70–99)
Glucose-Capillary: 141 mg/dL — ABNORMAL HIGH (ref 70–99)
Glucose-Capillary: 142 mg/dL — ABNORMAL HIGH (ref 70–99)
Glucose-Capillary: 154 mg/dL — ABNORMAL HIGH (ref 70–99)
Glucose-Capillary: 156 mg/dL — ABNORMAL HIGH (ref 70–99)

## 2024-04-04 LAB — CBC
HCT: 26 % — ABNORMAL LOW (ref 39.0–52.0)
Hemoglobin: 8.4 g/dL — ABNORMAL LOW (ref 13.0–17.0)
MCH: 27 pg (ref 26.0–34.0)
MCHC: 32.3 g/dL (ref 30.0–36.0)
MCV: 83.6 fL (ref 80.0–100.0)
Platelets: 240 K/uL (ref 150–400)
RBC: 3.11 MIL/uL — ABNORMAL LOW (ref 4.22–5.81)
RDW: 16.2 % — ABNORMAL HIGH (ref 11.5–15.5)
WBC: 22.1 K/uL — ABNORMAL HIGH (ref 4.0–10.5)
nRBC: 0 % (ref 0.0–0.2)

## 2024-04-04 LAB — HEPARIN LEVEL (UNFRACTIONATED): Heparin Unfractionated: 0.57 [IU]/mL (ref 0.30–0.70)

## 2024-04-04 LAB — MAGNESIUM: Magnesium: 2.4 mg/dL (ref 1.7–2.4)

## 2024-04-04 LAB — HEPATITIS B SURFACE ANTIBODY, QUANTITATIVE: Hep B S AB Quant (Post): <3.5 m[IU]/mL — ABNORMAL LOW

## 2024-04-04 LAB — SURGICAL PATHOLOGY

## 2024-04-04 MED ORDER — FENTANYL CITRATE (PF) 100 MCG/2ML IJ SOLN
INTRAMUSCULAR | Status: AC
  Filled 2024-04-04: qty 2

## 2024-04-04 MED ORDER — LIDOCAINE HCL 1 % IJ SOLN
INTRAMUSCULAR | Status: AC
  Filled 2024-04-04: qty 20

## 2024-04-04 MED ORDER — FUROSEMIDE 10 MG/ML IJ SOLN
80.0000 mg | Freq: Once | INTRAMUSCULAR | Status: AC
  Administered 2024-04-04: 12:00:00 80 mg via INTRAVENOUS
  Filled 2024-04-04: qty 8

## 2024-04-04 MED ORDER — MIDAZOLAM HCL (PF) 2 MG/2ML IJ SOLN
INTRAMUSCULAR | Status: AC | PRN
  Administered 2024-04-04: 16:00:00 1 mg via INTRAVENOUS

## 2024-04-04 MED ORDER — MIDAZOLAM HCL 2 MG/2ML IJ SOLN
INTRAMUSCULAR | Status: AC
  Filled 2024-04-04: qty 2

## 2024-04-04 MED ORDER — HEPARIN (PORCINE) 25000 UT/250ML-% IV SOLN
1800.0000 [IU]/h | INTRAVENOUS | Status: DC
  Administered 2024-04-04: 20:00:00 1800 [IU]/h via INTRAVENOUS
  Filled 2024-04-04: qty 250

## 2024-04-04 MED ORDER — GELATIN ABSORBABLE 12-7 MM EX MISC
CUTANEOUS | Status: AC
  Filled 2024-04-04: qty 1

## 2024-04-04 MED ORDER — HEPARIN SODIUM (PORCINE) 1000 UNIT/ML IJ SOLN
INTRAMUSCULAR | Status: AC
  Filled 2024-04-04: qty 10

## 2024-04-04 MED ORDER — FENTANYL CITRATE (PF) 100 MCG/2ML IJ SOLN
INTRAMUSCULAR | Status: AC | PRN
  Administered 2024-04-04 (×3): 25 ug via INTRAVENOUS

## 2024-04-04 MED ORDER — CEFAZOLIN SODIUM-DEXTROSE 2-4 GM/100ML-% IV SOLN
INTRAVENOUS | Status: AC | PRN
  Administered 2024-04-04: 16:00:00 2 g via INTRAVENOUS

## 2024-04-04 MED FILL — Lidocaine HCl Local Inj 1%: INTRAMUSCULAR | Qty: 20 | Status: AC

## 2024-04-04 MED FILL — Heparin Sodium (Porcine) Inj 1000 Unit/ML: INTRAMUSCULAR | Qty: 10 | Status: AC

## 2024-04-04 MED FILL — Cefazolin Sodium-Dextrose IV Solution 2 GM/100ML-4%: INTRAVENOUS | Qty: 100 | Status: AC

## 2024-04-04 NOTE — Progress Notes (Signed)
 Orthopedic Tech Progress Note Patient Details:  Billy [name] [birth year] [id number]  [name] will need to be removed and replaced Monday, 12/22, if the pt remains admitted.  Ortho Devices Type of Ortho Device: Radio broadcast assistant Ortho Device/Splint Location: BLE Ortho Device/Splint Interventions: Ordered, Application, Adjustment   Post Interventions Patient Tolerated: Well Instructions Provided: Care of device, Adjustment of device  [name] 04/04/2024, 11:37 AM

## 2024-04-04 NOTE — Progress Notes (Signed)
 PROGRESS NOTE Billy [name]    DOB: [birth year], 73 y.o.  FMW:[id number]    Code Status: Full Code   DOA: 01/21/2024   LOS: 74  Brief hospital course  Billy [name] is a 73 y.o. male with a PMH significant for cardiac arrest earlier this year along with HFrEF, Afib, PE who is presenting with several days of N/V from a stomach bug followed by sudden onset severe tearing epigastric pain radiating to back and chest. Workup revealed esophageal tear. Noted prior EGD during cardiac arrest admit showing duodenitis nonspecific requiring hemospray, erosive gastritis but normal esophagus. An esophageal stent was placed on 10/7 and then a subsequent swallow study revealed an additional leak. He was taken back to the OR for EGD and stent repositioning on 10/15 and was hypoxic with gastric contents noted on intubation. Pt was too hypoxic for extubation post-procedure, so the patient was left intubated and PCCM consulted and the patient transferred to intensive care   10/5 admit 10/7 esophageal stent 10/15 EGD and stent repositioning, likely aspiration and left intubated  10/16  successfully extubated. POCUS right effusion. Small bore CT placed. ~300 ml; gm stain neg, exudate by light's criteria. Lots of challenge w/ pain. Getting Dilaudid  and dex gtt.  10/17 marked pain still. Changing med regimen to ketamine  w/ scheduled orals and decreased freq of dilaudid   10/18 Precedex  was titrated off, patient is on low-dose ketamine  at 0.3 mg/kg/h.  Continue complain of diffuse pain.  Complaining of anxiety, started on Xanax  3 times daily as needed 10/19 ketamine  infusion was stopped, remained afebrile, continue to complain of shortness of breath with increased oxygen  requirement, at 10 L.  Chest tube output was minimal 10/20 patient was intubated and placed on mechanical ventilation.  Bedside ultrasound showed left-sided pleural effusion which was organized, left-sided chest tube was placed with exudative output of 120  cc Esophagram of 10/24 showed persistent leak despite being covered by esophageal stent by CT surgery will continue monitoring drainage for now 10/26 PCCM signed off.  11/3 PEG placed in OR 11/4 wt up 5 lbs getting lasix   11/5 entresto  resumed 11/6 cr climbing again. Diuretics held  11/7 pt CT pulled out  11/11 left chest tube removed 11/19 SVT in cephalic vein  11/26 stent removed. Esophagram negative for esophageal leak.  11/27 getting diuresis again 11/28 duodenitis/enteritis.  GI consulted. Bloody emesis added carafate   11/29 trialing clears 12/1 NV again w/ hematemesis. Lasix  held  12/2 EGD ischemic ulcers. Impression:  - Congested, inflamed, nodular, texture changed  mucosa in the esophagus. - Gastritis, Non-bleeding duodenal ulcers with a clean ulcer  base (Forrest Class III).- Non-bleeding jejunal ulcers with a clean ulcer   base (Forrest Class III)  - Findings suggestive of ischemic ulcers. Increased carafate  and cont BID PPI  12/3 full liq diet  12/4 marked increased WOB. Acute and progressive w/ increased hypoxia and HTN. PCCM asked to see  12/10 again increased WOB, stubborn to diuresis, unable to oxygenate, to ICU 12/11 Intubated upon arrival to ICU yesterday, HD/Aline/Bronch  12/12 extubated 12/13 c/o of epigastric pain but suspect more anxiety component, on cleviprex , CRRT for volume removal 12/15 pain improved, on RA, on clev and CRRT, renal bx pending 12/16 renal bx 12/17 off clev transition to iHD, transfer to floor   04/04/2024 -transferred to [name] care from [facility]  Assessment & Plan  Principal Problem:   Esophageal rupture Active Problems:   Pleural effusion   Ventilator dependence (HCC)   Acute respiratory failure with  hypoxia (HCC)   Protein-calorie malnutrition, severe   Intractable pain   Pressure injury of skin   Acute on chronic systolic CHF (congestive heart failure) (HCC)   On mechanically assisted ventilation (HCC)   Hypertensive urgency  Acute  hypoxic respiratory failure- H/o afib and remote PE Right pleural effusion  MRSA PNA, recurrent 12/10 with hemoptysis, some combination volume-pulmonary Edema, HCAP, ARDS +MRSA PCR and + BAL 12/10; meropenem  stopped 12/12 -remains comfortable on RA - aggressive pulm hygiene - Completed Linezolid  12/10-12/17    Petechiae Rash  Possible drug induced rash vs sepsis component  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  He has completed rounds of antibiotics.  Mild interstitial fibrosis/tubular atrophy. This biopsy does raise a concern for IgA Vasculitis/HSP given his clinical scenario - dc'd Losartan  and torsemide  -s/p CRRT 12/10-12/16 - on pred taper now -[facility] planned for later today with IR - nephrology following, no HD today. - RFP am    H/o Borehaaves syndrome; s/p esophageal stent. 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 Recs for carafate  1g QID for 2 months per GI  - Recurrent pain surrounding the PEG, intermittent and improves with pain medication - GI currently signed off for now, if suspect leak or evidence, would discuss with GI/TCTS -tolerating regular diet,  discussed with nutrition who would like to obtain a calorie count before stopping tube feeds   Acute on chronic HFpEF HTN HLD PAF History of PE ECHO 02/08/24- 60 to 65%, LV normal function, no regional wall abnormalities, mild concentric LV hypertrophy, RV systolic function normal, RV normal; TTE 12/10 EF 55-60%, +WMA- basal  inferior akinesis and basal inferolateral akinesis, RV systolic mildly reduced -Appreciate heart failure following -Blood pressure okay off Cleviprex . - continue minoxidil , consider resuming clonidine  if needed, defer Entresto  resumption to heart failure  -continue Amiodarone  200mg  BID. HR in 90s during exam -Heparin  drip and will transition to DOAC once no more procedures planned - GDMT as able  - con't statin, zetia   - NO hydralazine   - unna boots   Prior DVT/PE Superficial LUE thrombus - heparin  gtt    Acute on chronic anemia -No bleeding and stable hemoglobin - transfuse for Hgb > 8   Severe malnutrition Severe deconditioning -Has PEG, continue TF, plan for calorie count - MVI - PT/ OT as able   Hypophosphatemia - Trend and replete as needed   Decubitus ulcers -Wound management   Pain  Insomnia  Anxiety Concern for developing delirium ImprovING - cont oxy 10mg  q4 prn, dilaudid  for severe pain - Continue klonopin  0.5mg  BID and seroquel  25mg  BID and prn trazodone  at bedtime- -continue delirium precautions     Hyperglycemia - A1c 6 01/2024, pre-diabetic, likely exacerbated by critical illness/ steroids - goal 140-180 - cont CBG  q 4 with prn sSSI - semglee  10u daily    GOC- not ready for PMT talks, wants to continue full scope of care  Per documentation, guarded prognosis shared with Son- patient wants everything done  Body mass index is 22.8 kg/m.  VTE ppx: Place TED hose Start: 02/27/24 1903 SCDs Start: 01/21/24 2023  Diet:     Diet   Diet NPO time specified Except for: Sips with Meds   Consultants: PCCM Cardiology, heart  failure IR Nephrology  Vascular surgery   Subjective 04/04/2024    Pt reports feeling overall improved today. Awaiting tunneled cath. No chest pain. Tolerating diet well. Anxious to get better and out of hospital    Objective  Blood pressure (!) 185/68, pulse (!) 104, temperature 98.8 F (37.1 C), resp. rate (!) 24, height 6' 1 (1.854 m), weight 78.4 kg, SpO2 (P) 99%.  Intake/Output Summary (Last 24 hours) at 04/04/2024 0715 Last data filed at 04/04/2024 0600 Gross per 24 hour  Intake 2076.31 ml  Output 225 ml  Net 1851.31 ml   Filed Weights   04/02/24 0500 04/03/24 0600 04/04/24 0600  Weight: 77 kg 76.8 kg 78.4 kg    Physical Exam:  General: awake, alert, NAD HEENT: atraumatic, clear conjunctiva, anicteric sclera, MMM, hearing grossly normal Respiratory: normal respiratory effort. Cardiovascular: extremities well perfused, quick capillary refill, normal S1/S2, RRR, no JVD, murmurs Gastrointestinal: soft, NT, peg in place Nervous: A&O x3. no gross focal neurologic deficits, normal speech Extremities: moves all equally, no edema, normal tone Skin: dry, intact, normal temperature, normal color. No rashes, lesions or ulcers on exposed skin Psychiatry: normal mood, congruent affect  Labs   I have personally reviewed the following labs and imaging studies CBC    Component Value Date/Time   WBC 22.1 (H) 04/04/2024 0410   RBC 3.11 (L) 04/04/2024 0410   HGB 8.4 (L) 04/04/2024 0410   HGB 12.0 (L) 09/27/2023 0846   HCT 26.0 (L) 04/04/2024 0410   HCT 40.5 09/27/2023 0846   PLT 240 04/04/2024 0410   PLT 238 09/27/2023 0846   MCV 83.6 04/04/2024 0410   MCV 88 09/27/2023 0846   MCH 27.0 04/04/2024 0410   MCHC 32.3 04/04/2024 0410   RDW 16.2 (H) 04/04/2024 0410   RDW 17.3 (H) 09/27/2023 0846   LYMPHSABS 0.5 (L) 03/27/2024 0500   MONOABS 0.9 03/27/2024 0500   EOSABS 0.1 03/27/2024 0500   BASOSABS 0.0 03/27/2024 0500      Latest Ref Rng & Units 04/04/2024    4:10 AM  04/03/2024    5:17 AM 04/02/2024    4:25 AM  BMP  Glucose 70 - 99 mg/dL 876  846  786   BUN 8 - 23 mg/dL 80  53  26   Creatinine 0.61 - 1.24 mg/dL 6.36  7.42  8.86   Sodium 135 - 145 mmol/L 132  133  130   Potassium 3.5 - 5.1 mmol/L 5.0  4.9  4.4   Chloride 98 - 111 mmol/L 98  98  97   CO2 22 - 32 mmol/L 22  26  25    Calcium  8.9 - 10.3 mg/dL 7.7  7.9  7.2     US  RENAL Result Date: 04/03/2024 EXAM: US  Retroperitoneum Complete, Renal. 04/03/2024 08:51:48 AM TECHNIQUE: Real-time ultrasonography of the retroperitoneum renal was performed. COMPARISON: US  Renal 03/26/2024; 07/04/2023. CLINICAL HISTORY: Abdominal pain. FINDINGS: FINDINGS: RIGHT KIDNEY/URETER: Right kidney measures 10.8 x 5.8 x 5.3 cm. Normal cortical echogenicity. No hydronephrosis. No calculus.  No mass. LEFT KIDNEY/URETER: Left kidney measures 7.5 x 3.9 x 2.8 cm. The left kidney is relatively atrophic and echogenic, but is suboptimally visualized. No hydronephrosis. No calculus. No mass. BLADDER: Unremarkable appearance of the bladder. IMPRESSION: 1. Relatively atrophic and echogenic left kidney, suboptimally visualized. Electronically signed by: Evalene Coho MD 04/03/2024 09:14 AM EST RP Workstation: [id number]   DG Abd 1 View Result Date: 04/02/2024 CLINICAL DATA:  [id number] Abdominal pain [id number] EXAM: ABDOMEN - 1 VIEW COMPARISON:  March 30, 2024 FINDINGS: Similar gaseous distension of the small bowel and colon.Percutaneous gastrostomy tube noted overlying the stomach.No pneumoperitoneum. No organomegaly or radiopaque calculi. No acute fracture or destructive lesion. Similar changes in the lung bases. IVC filter again noted apex to the right of L2.Temperature probe overlying the midline pelvis. Mild bilateral hip osteoarthritis. Multilevel degenerative disc disease of the spine. IMPRESSION: Diffuse gaseous distention of the small bowel and colon, as can be seen in an adynamic ileus. Continued close radiographic follow-up  recommended. Electronically Signed   By: Rogelia Myers M.D.   On: 04/02/2024 18:42   US  BIOPSY (KIDNEY) Result Date: 04/02/2024 INDICATION: 73 year old with acute on chronic kidney disease. Concern for vasculitis or autoimmune process. Left kidney is atrophic. EXAM: ULTRASOUND-GUIDED RANDOM RENAL BIOPSY MEDICATIONS: Moderate sedation ANESTHESIA/SEDATION: Moderate (conscious) sedation was employed during this procedure. A total of Versed  1 mg and Fentanyl  50 mcg was administered intravenously by the radiology nurse. Total intra-service moderate Sedation Time: 15 minutes. The patient's level of consciousness and vital signs were monitored continuously by radiology nursing throughout the procedure under my direct supervision. FLUOROSCOPY TIME:  None COMPLICATIONS: None immediate. PROCEDURE: Informed written consent was obtained from the patient after a thorough discussion of the procedural risks, benefits and alternatives. All questions were addressed. A timeout was performed prior to the initiation of the procedure. Patient was placed prone. Right flank was evaluated with ultrasound. Right kidney was selected for biopsy. Right flank was prepped and draped in sterile fashion. Maximal barrier sterile technique was utilized including caps, mask, sterile gowns, sterile gloves, sterile drape, hand hygiene and skin antiseptic. Skin was anesthetized using 1% lidocaine . Small incision was made. Using ultrasound guidance, a 17 gauge coaxial needle was directed to the upper pole cortex. Two core biopsies were obtained from the upper pole cortex with an 18 gauge core device. Specimens placed in saline. Gel-Foam slurry was injected as the 17 gauge coaxial needle was retracted. Bandage placed over the puncture site. FINDINGS: Right kidney has an unusual orientation and upper pole is most posterior and accessible for biopsy. Two adequate core specimens obtained from the right kidney upper pole. No immediate bleeding or  hematoma formation. IMPRESSION: Successful ultrasound-guided right renal biopsy. Electronically Signed   By: [name] Balder M.D.   On: 04/02/2024 14:21   Disposition Plan & Communication  Patient status: Inpatient  Admitted From: Home Planned disposition location: Skilled nursing facility Anticipated discharge date: TBD pending renal function  Family Communication: none at bedside    Author: Marien LITTIE Piety, DO [facility] 04/04/2024, 7:15 AM   Available by Epic secure chat 7AM-7PM. If 7PM-7AM, please contact night-coverage.  [name] contact information found on [email or web address].
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
## 2024-04-04 NOTE — Plan of Care (Signed)
  Problem: Education: Goal: Knowledge of General Education information will improve Description: Including pain rating scale, medication(s)/side effects and non-pharmacologic comfort measures Outcome: Progressing   Problem: Health Behavior/Discharge Planning: Goal: Ability to manage health-related needs will improve Outcome: Progressing   Problem: Clinical Measurements: Goal: Will remain free from infection Outcome: Progressing Goal: Diagnostic test results will improve Outcome: Progressing Goal: Respiratory complications will improve Outcome: Progressing Goal: Cardiovascular complication will be avoided Outcome: Progressing   Problem: Activity: Goal: Risk for activity intolerance will decrease Outcome: Progressing   Problem: Nutrition: Goal: Adequate nutrition will be maintained Outcome: Progressing   Problem: Coping: Goal: Level of anxiety will decrease Outcome: Progressing   Problem: Elimination: Goal: Will not experience complications related to bowel motility Outcome: Progressing Goal: Will not experience complications related to urinary retention Outcome: Progressing   Problem: Pain Managment: Goal: General experience of comfort will improve and/or be controlled Outcome: Progressing   Problem: Safety: Goal: Ability to remain free from injury will improve Outcome: Progressing   Problem: Skin Integrity: Goal: Risk for impaired skin integrity will decrease Outcome: Progressing   Problem: Education: Goal: Ability to describe self-care measures that may prevent or decrease complications (Diabetes Survival Skills Education) will improve Outcome: Progressing   Problem: Coping: Goal: Ability to adjust to condition or change in health will improve Outcome: Progressing   Problem: Fluid Volume: Goal: Ability to maintain a balanced intake and output will improve Outcome: Progressing   Problem: Health Behavior/Discharge Planning: Goal: Ability to identify and  utilize available resources and services will improve Outcome: Progressing Goal: Ability to manage health-related needs will improve Outcome: Progressing   Problem: Metabolic: Goal: Ability to maintain appropriate glucose levels will improve Outcome: Progressing   Problem: Nutritional: Goal: Maintenance of adequate nutrition will improve Outcome: Progressing Goal: Progress toward achieving an optimal weight will improve Outcome: Progressing   Problem: Skin Integrity: Goal: Risk for impaired skin integrity will decrease Outcome: Progressing   Problem: Tissue Perfusion: Goal: Adequacy of tissue perfusion will improve Outcome: Progressing   Problem: Activity: Goal: Ability to tolerate increased activity will improve Outcome: Progressing   Problem: Respiratory: Goal: Ability to maintain a clear airway and adequate ventilation will improve Outcome: Progressing   Problem: Role Relationship: Goal: Method of communication will improve Outcome: Progressing   Problem: Education: Goal: Ability to describe self-care measures that may prevent or decrease complications (Diabetes Survival Skills Education) will improve Outcome: Progressing Goal: Individualized Educational Video(s) Outcome: Progressing   Problem: Coping: Goal: Ability to adjust to condition or change in health will improve Outcome: Progressing   Problem: Fluid Volume: Goal: Ability to maintain a balanced intake and output will improve Outcome: Progressing   Problem: Health Behavior/Discharge Planning: Goal: Ability to identify and utilize available resources and services will improve Outcome: Progressing Goal: Ability to manage health-related needs will improve Outcome: Progressing   Problem: Metabolic: Goal: Ability to maintain appropriate glucose levels will improve Outcome: Progressing   Problem: Nutritional: Goal: Maintenance of adequate nutrition will improve Outcome: Progressing Goal: Progress  toward achieving an optimal weight will improve Outcome: Progressing   Problem: Skin Integrity: Goal: Risk for impaired skin integrity will decrease Outcome: Progressing   Problem: Tissue Perfusion: Goal: Adequacy of tissue perfusion will improve Outcome: Progressing
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
## 2024-04-04 NOTE — Procedures (Signed)
 Interventional Radiology Procedure:   Indications: Acute on chronic renal failure  Procedure: Placement of tunneled dialysis catheter  Findings: Right jugular Palindrome, 23 cm, tip at SVC/RA junction   Complications: None     EBL: Minimal  Plan:  Catheter is ready to use.    Billy Orsborn R. Philip, MD  Pager: [phone]

## 2024-04-04 NOTE — Progress Notes (Signed)
 [facility] Progress Note   Assessment/ Plan:    AKI on CKD 3b             - HTN, backdrop of pretty bad L-sided RAS (occlusive)--> so essentially he has 1 functioning kidney and is more susceptible to volume status/ nephrotoxic agents             - off Losartan  and torsemide   -s/p CRRT 12/10-12/16  - ANA +, C4 low, + hematuria, anti-histone Ab +. 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  He has completed rounds of antibiotics.  Mild interstitial fibrosis/tubular atrophy. This biopsy does raise a concern for IgA Vasculitis/HSP given his clinical scenario however I don't believe adding MMF/Rituxan would be a good idea at this junction in the context of his MRSA pneumonia which he just finished abx for and esophageal perforation---risks>benefits. Also there is limited data with alternative therapies other than steroids - on pred taper now  -[facility] planned for later today with IR  -holding off on HD today, reassess tomorrow   2.  Petechial rash;             - appears like a drug rash             - stopped hydralazine  & unasyn   -improving   3.  Acute on chronic CHF exacerbation;             - stable vol status today   4.  H/o DVT and PE:             - had RH strain previously             - hep gtt on hold in anticipation of [facility] placement   5.  Boorehave's syndrome             - s/p repair             - Has a PEG  - EGD 03/19/24 with ulcers  - PPI and pepcid    6.  Terminal Ileitis/ Duodenitis             - seen on CT scan 03/24/24   7. HTN  -off cleviprex --can consider clonidine  if needed  Dispo: inpt  Discussed with primary service.  Subjective:    Patient seen and examined. No acute events, no complaints   Objective:   BP (!) 167/74   Pulse 91   Temp 99.1 F (37.3 C)   Resp (!) 9   Ht 6' 1  (1.854 m)   Wt 78.4 kg   SpO2 100%   BMI 22.80 kg/m   Intake/Output Summary (Last 24 hours) at 04/04/2024 9176 Last data filed at 04/04/2024 0756 Gross per 24 hour  Intake 2206.21 ml  Output 225 ml  Net 1981.21 ml   Weight change: 1.6 kg  Physical Exam: GEN: awake NECK: no JVD PULM: cta bl, unlabored, normal wob CV: RRR ABD: + PEG, soft, NT EXT: no sig edema b/l LEs SKIN: petechial nonblanching rash thighs, palms, arms NEURO: awake, alert, following commands Dialysis access: RIJ temp HD catheter  Imaging: US  RENAL Result Date: 04/03/2024 EXAM: US  Retroperitoneum Complete, Renal. 04/03/2024 08:51:48 AM TECHNIQUE: Real-time ultrasonography of the retroperitoneum renal was performed. COMPARISON: US  Renal 03/26/2024; 07/04/2023. CLINICAL HISTORY: Abdominal pain. FINDINGS: FINDINGS: RIGHT KIDNEY/URETER: Right kidney measures 10.8 x 5.8 x 5.3 cm. Normal cortical echogenicity. No  hydronephrosis. No calculus. No mass. LEFT KIDNEY/URETER: Left kidney measures 7.5 x 3.9 x 2.8 cm. The left kidney is relatively atrophic and echogenic, but is suboptimally visualized. No hydronephrosis. No calculus. No mass. BLADDER: Unremarkable appearance of the bladder. IMPRESSION: 1. Relatively atrophic and echogenic left kidney, suboptimally visualized. Electronically signed by: Evalene Coho MD 04/03/2024 09:14 AM EST RP Workstation: [id number]   DG Abd 1 View Result Date: 04/02/2024 CLINICAL DATA:  [id number] Abdominal pain [id number] EXAM: ABDOMEN - 1 VIEW COMPARISON:  March 30, 2024 FINDINGS: Similar gaseous distension of the small bowel and colon.Percutaneous gastrostomy tube noted overlying the stomach.No pneumoperitoneum. No organomegaly or radiopaque calculi. No acute fracture or destructive lesion. Similar changes in the lung bases. IVC filter again noted apex to the right of L2.Temperature probe overlying the midline pelvis. Mild bilateral hip osteoarthritis. Multilevel degenerative disc disease of  the spine. IMPRESSION: Diffuse gaseous distention of the small bowel and colon, as can be seen in an adynamic ileus. Continued close radiographic follow-up recommended. Electronically Signed   By: Rogelia Myers M.D.   On: 04/02/2024 18:42   US  BIOPSY (KIDNEY) Result Date: 04/02/2024 INDICATION: 73 year old with acute on chronic kidney disease. Concern for vasculitis or autoimmune process. Left kidney is atrophic. EXAM: ULTRASOUND-GUIDED RANDOM RENAL BIOPSY MEDICATIONS: Moderate sedation ANESTHESIA/SEDATION: Moderate (conscious) sedation was employed during this procedure. A total of Versed  1 mg and Fentanyl  50 mcg was administered intravenously by the radiology nurse. Total intra-service moderate Sedation Time: 15 minutes. The patient's level of consciousness and vital signs were monitored continuously by radiology nursing throughout the procedure under my direct supervision. FLUOROSCOPY TIME:  None COMPLICATIONS: None immediate. PROCEDURE: Informed written consent was obtained from the patient after a thorough discussion of the procedural risks, benefits and alternatives. All questions were addressed. A timeout was performed prior to the initiation of the procedure. Patient was placed prone. Right flank was evaluated with ultrasound. Right kidney was selected for biopsy. Right flank was prepped and draped in sterile fashion. Maximal barrier sterile technique was utilized including caps, mask, sterile gowns, sterile gloves, sterile drape, hand hygiene and skin antiseptic. Skin was anesthetized using 1% lidocaine . Small incision was made. Using ultrasound guidance, a 17 gauge coaxial needle was directed to the upper pole cortex. Two core biopsies were obtained from the upper pole cortex with an 18 gauge core device. Specimens placed in saline. Gel-Foam slurry was injected as the 17 gauge coaxial needle was retracted. Bandage placed over the puncture site. FINDINGS: Right kidney has an unusual orientation and  upper pole is most posterior and accessible for biopsy. Two adequate core specimens obtained from the right kidney upper pole. No immediate bleeding or hematoma formation. IMPRESSION: Successful ultrasound-guided right renal biopsy. Electronically Signed   By: [name] Balder M.D.   On: 04/02/2024 14:21     Labs: BMET Recent Labs  Lab 03/31/24 0552 03/31/24 1550 04/01/24 0428 04/01/24 1527 04/02/24 0425 04/03/24 0517 04/04/24 0410  NA 134* 133* 132* 134* 130* 133* 132*  K 4.2 4.9 4.4 4.8 4.4 4.9 5.0  CL 100 101 99 103 97* 98 98  CO2 23 25 25 25 25 26 22   GLUCOSE 139* 198* 95 161* 213* 153* 123*  BUN 32* 32* 28* 29* 26* 53* 80*  CREATININE 1.37* 1.27* 1.09 1.29* 1.13 2.57* 3.63*  CALCIUM  7.9* 7.5* 7.4* 7.3* 7.2* 7.9* 7.7*  PHOS 1.6* 4.4 2.5 2.8 2.5 3.6 4.3   CBC Recent Labs  Lab 04/02/24 0425 04/03/24 0517 04/03/24 0816 04/04/24 0410  WBC 16.3* 16.1* 17.3* 22.1*  HGB 9.0* 8.3* 8.0* 8.4*  HCT 27.6* 26.1* 24.7* 26.0*  MCV 85.2 83.1 83.4 83.6  PLT 229 233 221 240    Medications:     amiodarone   200 mg Per Tube BID   atorvastatin   80 mg Per Tube Daily   Chlorhexidine  Gluconate Cloth  6 each Topical Daily   clonazePAM   0.5 mg Per Tube BID   ezetimibe   10 mg Per Tube Daily   feeding supplement  237 mL Oral BID BM   feeding supplement (PROSource TF20)  60 mL Per Tube BID   fiber  1 packet Per Tube BID   [name] butt cream   Topical BID   guaiFENesin   10 mL Per Tube Q4H   insulin  aspart  0-9 Units Subcutaneous Q4H   insulin  glargine  10 Units Subcutaneous Daily   lidocaine   1 patch Transdermal Q24H   minoxidil   10 mg Per Tube Daily   multivitamin  1 tablet Per Tube QHS   oxidized cellulose  1 each Topical Once   pantoprazole  (PROTONIX ) IV  40 mg Intravenous Q12H   predniSONE   60 mg Oral Q breakfast   Followed by   NOREEN ON 04/07/2024] predniSONE   40 mg Oral Q breakfast   Followed by   NOREEN ON 04/14/2024] predniSONE   20 mg Oral Q breakfast   Followed by   NOREEN  ON 04/21/2024] predniSONE   10 mg Oral Q breakfast   QUEtiapine   25 mg Per Tube BID   saccharomyces boulardii  250 mg Per Tube BID   sodium chloride  flush  10-40 mL Intracatheter Q12H    Ephriam Stank, MD [facility] 04/04/2024, 8:23 AM

## 2024-04-04 NOTE — Progress Notes (Addendum)
 Patient ID: Billy [name], male   DOB: November 27, 1950, 73 y.o.   MRN: [id number]     Advanced Heart Failure Rounding Note  Cardiologist: Dorn Lesches, MD   Chief Complaint: HFpEF  Patient Profile  Patient with longstanding history of chronic heart failure with preserved ejection fraction, RV failure with history of pulmonary embolism presents with esophageal perforation.  S/P Esophageal Stent. Stent repositioned 10/15. AHF signed off 11/10. Re-consulted 03/22/24 with A/C HFpEF.   Significant events:    - 10/6: esoph stent placement - 10/15: S/p EGD with stent repositioning by Dr. Shyrl  - 10/23: Ltd echo EF 60-65%  - 10/24: Barium swallow showed persistent leak  - 11/3: PEG tube placed - 11/11: L CT removed - 11/19: SVT in cephalic vein - 11/26: Stent removed. Esophagram (-) for esophageal leak - 11/28: Duodenitis/Enteritis. GI consulted. + bloody emesis.  - 12/2: EDG w/ ischemic ulcers. Carafate  increased + BID PPI - 12/4: PCCM consulted for increased WOB and progressive hypoxia.  - 12/5: AHF re consulted for A/C HFpEF - 12/10: Transferred back to ICU with acute respiratory distress, reintubated. Concern for drug-induced lupus 2/2 hydralazine . CRRT started.  Echo showed EF 60-65% with basal inferior akinesis, moderate LVH, normal RV size and mildly decreased systolic function.   Subjective:    Has had long, complicated hospitalization. Day: 74  CVP 11  Going for [facility] later today with IR. No HD today per Nephro.  Off cleviprex , on minoxidil  10 mg daily for HTN.  No chest pain or shortness of breath. In good spirits. Has been working with PT.   Objective:    Weight Range: 78.4 kg Body mass index is 22.8 kg/m.   Vital Signs:   Temp:  [98.6 F (37 C)-100 F (37.8 C)] 99.1 F (37.3 C) (12/18 0800) Pulse Rate:  [71-238] 91 (12/18 0800) Resp:  [9-28] 9 (12/18 0800) BP: (113-185)/(63-83) 167/74 (12/18 0800) SpO2:  [87 %-100 %] 100 % (12/18 0800) Arterial Line BP:  (93-149)/(49-65) 93/54 (12/17 1300) Weight:  [78.4 kg] 78.4 kg (12/18 0600) Last BM Date : 04/03/24  Weight change: Filed Weights   04/02/24 0500 04/03/24 0600 04/04/24 0600  Weight: 77 kg 76.8 kg 78.4 kg   Intake/Output:  Intake/Output Summary (Last 24 hours) at 04/04/2024 0915 Last data filed at 04/04/2024 0756 Gross per 24 hour  Intake 1904.51 ml  Output 225 ml  Net 1679.51 ml    Physical Exam   General:  Sitting up in bed.  Cor: Irregular rhythm. No murmur Lungs: clear Abdomen: soft, nontender, nondistended. Extremities: no edema, + petechial rash Neuro: alert & orientedx3. Affect pleasant   Telemetry   AFL 80s  Labs    CBC Recent Labs    04/03/24 0816 04/04/24 0410  WBC 17.3* 22.1*  HGB 8.0* 8.4*  HCT 24.7* 26.0*  MCV 83.4 83.6  PLT 221 240   Basic Metabolic Panel Recent Labs    87/82/74 0517 04/04/24 0410  NA 133* 132*  K 4.9 5.0  CL 98 98  CO2 26 22  GLUCOSE 153* 123*  BUN 53* 80*  CREATININE 2.57* 3.63*  CALCIUM  7.9* 7.7*  MG 2.5* 2.4  PHOS 3.6 4.3   Liver Function Tests Recent Labs    04/03/24 0517 04/04/24 0410  ALBUMIN  2.5* 2.5*   BNP (last 3 results) Recent Labs    06/27/23 1116 03/22/24 0913 03/25/24 0535  BNP 646.4* 2,404.1* 831.9*   Hemoglobin A1C No results for input(s): HGBA1C in the last 72 hours.  Fasting Lipid Panel No results for input(s): CHOL, HDL, LDLCALC, TRIG, CHOLHDL, LDLDIRECT in the last 72 hours.   Medications:    Scheduled Medications:  amiodarone   200 mg Per Tube BID   atorvastatin   80 mg Per Tube Daily   Chlorhexidine  Gluconate Cloth  6 each Topical Daily   clonazePAM   0.5 mg Per Tube BID   ezetimibe   10 mg Per Tube Daily   feeding supplement  237 mL Oral BID BM   feeding supplement (PROSource TF20)  60 mL Per Tube BID   fiber  1 packet Per Tube BID   [name] butt cream   Topical BID   guaiFENesin   10 mL Per Tube Q4H   insulin  aspart  0-9 Units Subcutaneous Q4H   insulin   glargine  10 Units Subcutaneous Daily   lidocaine   1 patch Transdermal Q24H   minoxidil   10 mg Per Tube Daily   multivitamin  1 tablet Per Tube QHS   oxidized cellulose  1 each Topical Once   pantoprazole  (PROTONIX ) IV  40 mg Intravenous Q12H   predniSONE   60 mg Oral Q breakfast   Followed by   NOREEN ON 04/07/2024] predniSONE   40 mg Oral Q breakfast   Followed by   NOREEN ON 04/14/2024] predniSONE   20 mg Oral Q breakfast   Followed by   NOREEN ON 04/21/2024] predniSONE   10 mg Oral Q breakfast   QUEtiapine   25 mg Per Tube BID   saccharomyces boulardii  250 mg Per Tube BID   sodium chloride  flush  10-40 mL Intracatheter Q12H    Infusions:  famotidine  (PEPCID ) IV     feeding supplement (VITAL 1.5 CAL) Stopped (04/04/24 0730)   heparin  1,800 Units/hr (04/04/24 0700)   promethazine  (PHENERGAN ) injection (IM or IVPB) Stopped (03/18/24 0911)    PRN Medications: acetaminophen  (TYLENOL ) oral liquid 160 mg/5 mL, alum & mag hydroxide-simeth, diclofenac  Sodium, [COMPLETED] diphenoxylate -atropine  **FOLLOWED BY** diphenoxylate -atropine , famotidine  (PEPCID ) IV, glucagon  (human recombinant), HYDROmorphone  (DILAUDID ) injection, hydrOXYzine , lip balm, mouth rinse, oxyCODONE , polyethylene glycol, promethazine  (PHENERGAN ) injection (IM or IVPB), simethicone , sodium chloride , sodium chloride  flush, traZODone   Assessment/Plan  1.  Esophageal Perforation - Boerhaave syndrome.  - CT C/A/P: with pneumomediastinum and large mass-like density in lower esophagus - 10/6: EGD with esophageal stent placed  - 10/15: Stent migrated w/ leak > back to OR for repositioning  - 10/24: barium swallow w persistent leak  - 11/3: PEG placed - on TF and meds by PEG.  - 11/26: Stent removed. Esophagram (-) for esophageal leak - 12/2: EDG w/ ischemic ulcers. Carafate  increased + BID PPI - Tolerating diet  2. Acute on chronic HFpEF - h/o cardiogenic shock with RV failure in the setting of cardiac arrest thought to be  caused by acute PE vs ACS in 3/25. Echo at the time showed EF 55%, severe, LVH, G2DD, and severely reduced RV function with strain. There was concern for cardiac sarcoid amyloid, however CMR LGE consistent with ischemic disease. RV on echo 9/25 with complete recovery, EF 60-65%, nl RV function.    - RHC 3/25: Mild PAH with severe RV failure though CO is preserved.   - Echo 10/25 EF 60-65%, RV normal.  - Echo 12/10: EF 60-65% with basal inferior akinesis, moderate LVH, normal RV size and mildly decreased systolic function.  - Off pressors and now on clevidipine  for HTN.   - CVP 11. Nephrology managing volume removal. No HD today. Going for [facility] w/ IR today. CRRT stopped 12/16 - Co-ox  stable. Will stop checking.  3. CAD:  - LHC 3/25: 3v CAD with CTO RCA with L->R collaterals and moderate non-obstructive CAD in L system. Medical management.  - suspect initial CP related to esophageal issues and not CAD - No chest pain.     4.  AKI on CKD Stage 3b: Baseline sCr ~ 2.1. - Worsening AKI> CRRT - significant L RAS.   - Now off CRRT. TDC today, possible HD tomorrow.  5. PAF:   - s/p TEE/DCCV 3/25 - Still in atrial flutter this morning but rates controlled. Continue amiodarone  200 gm BID - Continue heparin  gtt until all procedures complete - on hold for TDC  6.  H/o DVT/PE:  Bilateral upper and lower DVTs, mixed acute and chronic.  - s/p infrarenal IVC placement 3/25 - V/Q 3/25 with multiple perfusion defects - Heparin  drip, on hold for TDC   7. Carotid stenosis: h/o CVA. TCAR in 6/24. Okay to remain off plavix  at this point. - carotid US  9/25: widely patent carotid stent on R and no stenosis on L   8. Acute Hypoxic Respiratory Failure/PNA/ Exudative Lt Pleural Effusion  - CT placed for pleural effusion. Resp Cx w/ rare yeast, BCx NGTD  - Re-intubated 12/10 per CCM.  - Extubated 12/12. Stable on RA  9. Severe protein calorie malnutrition - Nutrition following. Getting tube feeds but taking  some PO.  10. HTN - Reasonable off Clevidipine   - He has had problems with both hydralazine  and amlodipine .   - Continue minoxidil  to 10 mg daily - Can use clonidine  if needed  11. Superficial LUE thrombus - Continue PICC - Heparin  gtt  12. Petechial Rash - With diffuse petechial rash, ANA+, anti-histone ab+, and low C4, there is concern for possible drug-induced lupus from hydralazine  with AKI.  ANCA negative.   -Discussed renal biopsy with CCM - inconclusive. Concern for possible IgA Vasculitis/HSP, but not starting MMF/Rituxan given recent MRSA PNA s/p recent abx and esophageal perf. Now on prednisone  taper.  13. Anemia - Hgb 11.2>9>8>8.4, had transfusion 12/12.  - Continue to monitor  HF will sign off once out of the unit.   He will not need to be followed by Advanced Heart Failure or Cardiology out of the ICU.   FINCH, LINDSAY N 04/04/2024 9:15 AM  Patient seen NP, I formulated the plan and agree with the above note.   Co-ox 76%, CVP 11.  Creatinine rising, up to 3.6. UOP 225 cc.   In atypical atrial flutter, HR 100s.  On po amiodarone  and heparin .    General: NAD Neck: JVP 10 cm, no thyromegaly or thyroid  nodule.  Lungs: Clear to auscultation bilaterally with normal respiratory effort. CV: Nondisplaced PMI.  Heart irregular S1/S2, no S3/S4, no murmur.  No peripheral edema.   Abdomen: Soft, nontender, no hepatosplenomegaly, no distention.  Skin: Petechial rash arms/legs.  Neurologic: Alert and oriented x 3.  Psych: Normal affect. Extremities: No clubbing or cyanosis.  HEENT: Normal.   Completed linezolid  for MRSA on BAL.    CVP up to 11, creatinine up to 3.6.  Will challenge with Lasix  80 mg IV x 1 today.  Going for [facility] later today, will likely need iHD tomorrow.     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 He has had abx. IgA vasculitis also a concern.  -  Per nephrology, will continue on a prednisone  taper and will not use Rituxan.  He is in atypical atrial flutter today.  On heparin  gtt until procedures completed.  On amiodarone  200 bid.  Not candidate for TEE due to esophageal pathology.  Once he is stable on anticoagulation, can be cardioverted after a month of continuous therapeutic anticoagulation.     He has a very limited range of BP meds that he can tolerate as noted above. Continue minoxidil , will add clonidine  0.1 bid.    Cardiology will sign off, call with questions.   Ezra Shuck 04/04/2024 11:01 AM

## 2024-04-05 ENCOUNTER — Inpatient Hospital Stay (HOSPITAL_COMMUNITY)

## 2024-04-05 DIAGNOSIS — Z931 Gastrostomy status: Secondary | ICD-10-CM | POA: Diagnosis not present

## 2024-04-05 DIAGNOSIS — K223 Perforation of esophagus: Secondary | ICD-10-CM | POA: Diagnosis not present

## 2024-04-05 DIAGNOSIS — N179 Acute kidney failure, unspecified: Secondary | ICD-10-CM | POA: Diagnosis not present

## 2024-04-05 LAB — RENAL FUNCTION PANEL
Albumin: 2.6 g/dL — ABNORMAL LOW (ref 3.5–5.0)
Anion gap: 14 (ref 5–15)
BUN: 101 mg/dL — ABNORMAL HIGH (ref 8–23)
CO2: 21 mmol/L — ABNORMAL LOW (ref 22–32)
Calcium: 8.3 mg/dL — ABNORMAL LOW (ref 8.9–10.3)
Chloride: 99 mmol/L (ref 98–111)
Creatinine, Ser: 4.33 mg/dL — ABNORMAL HIGH (ref 0.61–1.24)
GFR, Estimated: 14 mL/min — ABNORMAL LOW
Glucose, Bld: 162 mg/dL — ABNORMAL HIGH (ref 70–99)
Phosphorus: 4.9 mg/dL — ABNORMAL HIGH (ref 2.5–4.6)
Potassium: 5.3 mmol/L — ABNORMAL HIGH (ref 3.5–5.1)
Sodium: 134 mmol/L — ABNORMAL LOW (ref 135–145)

## 2024-04-05 LAB — HEPARIN LEVEL (UNFRACTIONATED): Heparin Unfractionated: 0.44 [IU]/mL (ref 0.30–0.70)

## 2024-04-05 LAB — CBC
HCT: 26.6 % — ABNORMAL LOW (ref 39.0–52.0)
Hemoglobin: 8.7 g/dL — ABNORMAL LOW (ref 13.0–17.0)
MCH: 27.2 pg (ref 26.0–34.0)
MCHC: 32.7 g/dL (ref 30.0–36.0)
MCV: 83.1 fL (ref 80.0–100.0)
Platelets: 256 K/uL (ref 150–400)
RBC: 3.2 MIL/uL — ABNORMAL LOW (ref 4.22–5.81)
RDW: 16.5 % — ABNORMAL HIGH (ref 11.5–15.5)
WBC: 26.8 K/uL — ABNORMAL HIGH (ref 4.0–10.5)
nRBC: 0 % (ref 0.0–0.2)

## 2024-04-05 LAB — GLUCOSE, CAPILLARY
Glucose-Capillary: 148 mg/dL — ABNORMAL HIGH (ref 70–99)
Glucose-Capillary: 154 mg/dL — ABNORMAL HIGH (ref 70–99)
Glucose-Capillary: 167 mg/dL — ABNORMAL HIGH (ref 70–99)
Glucose-Capillary: 167 mg/dL — ABNORMAL HIGH (ref 70–99)
Glucose-Capillary: 172 mg/dL — ABNORMAL HIGH (ref 70–99)

## 2024-04-05 LAB — TYPE AND SCREEN
ABO/RH(D): A POS
Antibody Screen: NEGATIVE

## 2024-04-05 LAB — HEMOGLOBIN AND HEMATOCRIT, BLOOD
HCT: 26.7 % — ABNORMAL LOW (ref 39.0–52.0)
Hemoglobin: 8.9 g/dL — ABNORMAL LOW (ref 13.0–17.0)

## 2024-04-05 MED ORDER — OXIDIZED CELLULOSE EX PADS
1.0000 | MEDICATED_PAD | Freq: Once | CUTANEOUS | Status: AC
  Administered 2024-04-05: 1 via TOPICAL
  Filled 2024-04-05: qty 1

## 2024-04-05 MED ORDER — HEPARIN (PORCINE) 25000 UT/250ML-% IV SOLN
INTRAVENOUS | Status: AC
  Filled 2024-04-05: qty 250

## 2024-04-05 MED ORDER — VITAL 1.5 CAL PO LIQD
980.0000 mL | ORAL | Status: DC
  Filled 2024-04-05: qty 1000

## 2024-04-05 MED ORDER — APIXABAN 5 MG PO TABS
5.0000 mg | ORAL_TABLET | Freq: Two times a day (BID) | ORAL | Status: DC
  Administered 2024-04-05 – 2024-04-07 (×4): 5 mg via ORAL
  Filled 2024-04-05 (×5): qty 1

## 2024-04-05 MED ORDER — VITAL 1.5 CAL PO LIQD
1000.0000 mL | ORAL | Status: DC
  Administered 2024-04-05 – 2024-04-11 (×8): 1000 mL
  Filled 2024-04-05 (×12): qty 1000

## 2024-04-05 NOTE — Progress Notes (Signed)
  [facility] Progress Note   Assessment/ Plan:   AKI on CKD 3b             - HTN, backdrop of pretty bad L-sided RAS (occlusive)--> so essentially he has 1 functioning kidney and is more susceptible to volume status/ nephrotoxic agents             - off Losartan  and torsemide   -s/p CRRT 12/10-12/16  - ANA +, C4 low, + hematuria, anti-histone Ab +. 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  He has completed rounds of antibiotics.  Mild interstitial fibrosis/tubular atrophy. This biopsy does raise a concern for IgA Vasculitis/HSP given his clinical scenario however I don't believe adding MMF/Rituxan would be a good idea at this junction in the context of his MRSA pneumonia which he just finished abx for and esophageal perforation---risks>benefits. Also there is limited data with alternative therapies other than steroids. Overall, I do suspect this is more infection related given exudative appearance - on pred taper now -no evidence of renal recovery to date  -s/p [facility] 12/18. HD planned for today. Will reassess tomorrow  Abdominal pain -mgmt per primary service. S/p AXR today, read pending, personally reviewed and does look like he has dilated loops, discussed with primary service   Petechial rash;             - appears like a drug rash but can't rule out HSP             - stopped hydralazine  & unasyn   -improving/stable   Acute on chronic CHF exacerbation;             - stable vol status today, UF as tolerated with HD   H/o DVT and PE:             - had RH strain previously             - hep gtt on hold in anticipation of TDC placement   Boorehave's syndrome             - s/p repair             - Has a PEG  - EGD 03/19/24 with ulcers  - PPI and pepcid    HTN  -off cleviprex --can consider clonidine  if  needed  Dispo: inpt  Discussed with primary service especially in regards to abd pain  Subjective:    Patient seen and examined. Complains of severe abdominal pain   Objective:   BP (!) 148/60 (BP Location: Left Arm)   Pulse 97   Temp 97.9 F (36.6 C) (Oral)   Resp 12   Ht 6' 1 (1.854 m)   Wt 78.4 kg   SpO2 93%   BMI 22.80 kg/m   Intake/Output Summary (Last 24 hours) at 04/05/2024 1153 Last data filed at 04/05/2024 0500 Gross per 24 hour  Intake 183.22 ml  Output 775 ml  Net -591.78 ml   Weight change:   Physical Exam: GEN: awake, ill appearing NECK: no JVD PULM: cta bl, unlabored, normal wob CV: RRR ABD: + PEG, distended and firmer, tender to palpation EXT: no sig edema b/l LEs SKIN: petechial nonblanching rash thighs, palms, arms NEURO: awake, alert, following commands Dialysis access: RIJ temp HD catheter  Imaging: IR TUNNELED CENTRAL VENOUS CATH [facility] W IMG Result Date: 04/04/2024 INDICATION: 73 year old with acute on chronic kidney  disease. EXAM: FLUOROSCOPIC AND ULTRASOUND GUIDED PLACEMENT OF A TUNNELED DIALYSIS CATHETER Physician: [name] SAUNDERS. Philip, MD MEDICATIONS: Ancef  2 g; The antibiotic was administered within an appropriate time interval prior to skin puncture. ANESTHESIA/SEDATION: Moderate (conscious) sedation was employed during this procedure. A total of Versed  1 mg and fentanyl  75 mcg was administered intravenously at the order of the provider performing the procedure. Total intra-service moderate sedation time: 23 minutes. Patient's level of consciousness and vital signs were monitored continuously by radiology nurse throughout the procedure under the supervision of the provider performing the procedure. FLUOROSCOPY TIME:  Radiation Exposure Index (as provided by the fluoroscopic device): 9 mGy Kerma COMPLICATIONS: None immediate. PROCEDURE: Informed consent was obtained for placement of a tunneled dialysis catheter. The patient was placed supine on the  interventional table. Ultrasound confirmed a patent right internal jugular vein. Ultrasound image obtained for documentation. The right neck and chest was prepped and draped in a sterile fashion. Maximal barrier sterile technique was utilized including caps, mask, sterile gowns, sterile gloves, sterile drape, hand hygiene and skin antiseptic. The right neck was anesthetized with 1% lidocaine . A small incision was made with #11 blade scalpel. A 21 gauge needle directed into the right internal jugular vein with ultrasound guidance. A micropuncture dilator set was placed. A 23 cm tip to cuff Palindrome catheter was selected. The skin below the right clavicle was anesthetized and a small incision was made with an #11 blade scalpel. A subcutaneous tunnel was formed to the vein dermatotomy site. The catheter was brought through the tunnel. The vein dermatotomy site was dilated to accommodate a peel-away sheath over a wire. The catheter was placed through the peel-away sheath and directed into the central venous structures. The tip of the catheter was placed at superior cavoatrial junction with fluoroscopy. Fluoroscopic images were obtained for documentation. Both lumens were found to aspirate and flush well. The proper amount of heparin  was flushed in both lumens. The vein dermatotomy site was closed using a single layer of absorbable suture and Dermabond. Gel-Foam was placed in the subcutaneous tract. The catheter was secured to the skin using Prolene suture. IMPRESSION: Successful placement of a right jugular tunneled dialysis catheter using ultrasound and fluoroscopic guidance. Electronically Signed   By: [name] Philip M.D.   On: 04/04/2024 17:01     Labs: BMET Recent Labs  Lab 03/31/24 1550 04/01/24 0428 04/01/24 1527 04/02/24 0425 04/03/24 0517 04/04/24 0410 04/05/24 0415  NA 133* 132* 134* 130* 133* 132* 134*  K 4.9 4.4 4.8 4.4 4.9 5.0 5.3*  CL 101 99 103 97* 98 98 99  CO2 25 25 25 25 26 22  21*   GLUCOSE 198* 95 161* 213* 153* 123* 162*  BUN 32* 28* 29* 26* 53* 80* 101*  CREATININE 1.27* 1.09 1.29* 1.13 2.57* 3.63* 4.33*  CALCIUM  7.5* 7.4* 7.3* 7.2* 7.9* 7.7* 8.3*  PHOS 4.4 2.5 2.8 2.5 3.6 4.3 4.9*   CBC Recent Labs  Lab 04/03/24 0517 04/03/24 0816 04/04/24 0410 04/05/24 0415 04/05/24 0921  WBC 16.1* 17.3* 22.1* 26.8*  --   HGB 8.3* 8.0* 8.4* 8.7* 8.9*  HCT 26.1* 24.7* 26.0* 26.6* 26.7*  MCV 83.1 83.4 83.6 83.1  --   PLT 233 221 240 256  --     Medications:     amiodarone   200 mg Per Tube BID   apixaban   5 mg Oral BID   atorvastatin   80 mg Per Tube Daily   Chlorhexidine  Gluconate Cloth  6 each Topical Daily  clonazePAM   0.5 mg Per Tube BID   ezetimibe   10 mg Per Tube Daily   feeding supplement  237 mL Oral BID BM   feeding supplement (PROSource TF20)  60 mL Per Tube BID   feeding supplement (VITAL 1.5 CAL)  980 mL Per Tube Q24H   fiber  1 packet Per Tube BID   [name] butt cream   Topical BID   guaiFENesin   10 mL Per Tube Q4H   insulin  aspart  0-9 Units Subcutaneous Q4H   insulin  glargine  10 Units Subcutaneous Daily   lidocaine   1 patch Transdermal Q24H   minoxidil   10 mg Per Tube Daily   multivitamin  1 tablet Per Tube QHS   pantoprazole  (PROTONIX ) IV  40 mg Intravenous Q12H   predniSONE   60 mg Oral Q breakfast   Followed by   NOREEN ON 04/07/2024] predniSONE   40 mg Oral Q breakfast   Followed by   NOREEN ON 04/14/2024] predniSONE   20 mg Oral Q breakfast   Followed by   NOREEN ON 04/21/2024] predniSONE   10 mg Oral Q breakfast   QUEtiapine   25 mg Per Tube BID   saccharomyces boulardii  250 mg Per Tube BID   sodium chloride  flush  10-40 mL Intracatheter Q12H    Ephriam Stank, MD [facility] 04/05/2024, 11:53 AM

## 2024-04-05 NOTE — Progress Notes (Signed)
 Patient taking down for abdominal x-ray

## 2024-04-05 NOTE — Progress Notes (Signed)
 Per MD pause tube feed until x-ray of abdomen results comes back

## 2024-04-05 NOTE — TOC Progression Note (Signed)
 Transition of Care [facility]) - Progression Note    Patient Details  Name: Billy [name] MRN: [id number] Date of Birth: 06/02/1950  [facility] CM/SW Contact  Lendia Dais, [place] Phone Number: 04/05/2024, 12:32 PM  Clinical Narrative:  Pt is not yet medically stable. HD cath was placed yesterday and ileus will need further evaluation.   Pt has accepted a bed at [facility] earlier in admission. CSW spoke to [name] of [facility] who stated the pt still has a bed available when medically ready.  CSW will continue to follow.    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, SNF Pending bed offer               Expected Discharge Plan and Services In-house Referral: Clinical Social Work Discharge Planning Services: [employer] Consult Post Acute Care Choice: Skilled Nursing Facility Living arrangements for the past 2 months: Single Family Home                                       Social Drivers of Health (SDOH) Interventions SDOH Screenings   Food Insecurity: No Food Insecurity (01/22/2024)  Housing: High Risk (01/22/2024)  Transportation Needs: No Transportation Needs (01/22/2024)  Utilities: Not At Risk (01/22/2024)  Social Connections: Socially Isolated (01/22/2024)  Tobacco Use: Low Risk (03/19/2024)    Readmission Risk Interventions    06/27/2023    1:52 PM 10/14/2022   11:29 AM  Readmission Risk Prevention Plan  Post Dischage Appt  Complete  Medication Screening  Complete  Transportation Screening Complete Complete  HRI or Home Care Consult Complete   Social Work Consult for Recovery Care Planning/Counseling Complete   Palliative Care Screening Not Applicable   Medication Review Oceanographer) Referral to Pharmacy

## 2024-04-05 NOTE — Progress Notes (Signed)
 OT Cancellation Note  Patient Details Name: Arend Bahl MRN: [id number] DOB: [birth year]   Cancelled Treatment:    Reason Eval/Treat Not Completed: Patient declined, no reason specified Planned to see pt for OT session at 1030AM. However with attempts at OT session, pt reports too much pain to participate and requesting additional meds if possible. Will follow up at a later date.   Mliss Fish 04/05/2024, 10:31 AM

## 2024-04-05 NOTE — Progress Notes (Signed)
 At 0415 IV nurse noted patient dialysis catheter is bleeding. Catheter was last assessed at 0200 when iv pain medication was given and catheter was not bleeding at that time.

## 2024-04-05 NOTE — Progress Notes (Signed)
 " PROGRESS NOTE Billy [name]    DOB: 1951/03/28, 73 y.o.  FMW:[id number]    Code Status: Full Code   DOA: 01/21/2024   LOS: 75  Brief hospital course  Billy [name] is a 73 y.o. male with a PMH significant for cardiac arrest earlier this year along with HFrEF, Afib, PE who is presenting with several days of N/V from a stomach bug followed by sudden onset severe tearing epigastric pain radiating to back and chest. Workup revealed esophageal tear. Noted prior EGD during cardiac arrest admit showing duodenitis nonspecific requiring hemospray, erosive gastritis but normal esophagus. An esophageal stent was placed on 10/7 and then a subsequent swallow study revealed an additional leak. He was taken back to the OR for EGD and stent repositioning on 10/15 and was hypoxic with gastric contents noted on intubation. Pt was too hypoxic for extubation post-procedure, so the patient was left intubated and PCCM consulted and the patient transferred to intensive care   10/5 admit 10/7 esophageal stent 10/15 EGD and stent repositioning, likely aspiration and left intubated  10/16  successfully extubated. POCUS right effusion. Small bore CT placed. ~300 ml; gm stain neg, exudate by light's criteria. Lots of challenge w/ pain. Getting Dilaudid  and dex gtt.  10/17 marked pain still. Changing med regimen to ketamine  w/ scheduled orals and decreased freq of dilaudid   10/18 Precedex  was titrated off, patient is on low-dose ketamine  at 0.3 mg/kg/h.  Continue complain of diffuse pain.  Complaining of anxiety, started on Xanax  3 times daily as needed 10/19 ketamine  infusion was stopped, remained afebrile, continue to complain of shortness of breath with increased oxygen  requirement, at 10 L.  Chest tube output was minimal 10/20 patient was intubated and placed on mechanical ventilation.  Bedside ultrasound showed left-sided pleural effusion which was organized, left-sided chest tube was placed with exudative output of 120  cc Esophagram of 10/24 showed persistent leak despite being covered by esophageal stent by CT surgery will continue monitoring drainage for now 10/26 PCCM signed off.  11/3 PEG placed in OR 11/4 wt up 5 lbs getting lasix   11/5 entresto  resumed 11/6 cr climbing again. Diuretics held  11/7 pt CT pulled out  11/11 left chest tube removed 11/19 SVT in cephalic vein  11/26 stent removed. Esophagram negative for esophageal leak.  11/27 getting diuresis again 11/28 duodenitis/enteritis.  GI consulted. Bloody emesis added carafate   11/29 trialing clears 12/1 NV again w/ hematemesis. Lasix  held  12/2 EGD ischemic ulcers. Impression:  - Congested, inflamed, nodular, texture changed  mucosa in the esophagus. - Gastritis, Non-bleeding duodenal ulcers with a clean ulcer  base (Forrest Class III).- Non-bleeding jejunal ulcers with a clean ulcer   base (Forrest Class III)  - Findings suggestive of ischemic ulcers. Increased carafate  and cont BID PPI  12/3 full liq diet  12/4 marked increased WOB. Acute and progressive w/ increased hypoxia and HTN. PCCM asked to see  12/10 again increased WOB, stubborn to diuresis, unable to oxygenate, to ICU 12/11 Intubated upon arrival to ICU yesterday, HD/Aline/Bronch  12/12 extubated 12/13 c/o of epigastric pain but suspect more anxiety component, on cleviprex , CRRT for volume removal 12/15 pain improved, on RA, on clev and CRRT, renal bx pending 12/16 renal bx 12/17 off clev transition to iHD, transfer to floor   04/05/2024 -transferred to [name] care from PCCM yesterday. Has distended and painful abdomen today. Made him NPO again and got KUB. Heparin  was stopped yesterday after his tunneled cath had some oozing.  Assessment & Plan  Principal Problem:   Esophageal rupture Active Problems:   Pleural effusion   Ventilator dependence (HCC)   Acute respiratory failure with hypoxia (HCC)   Protein-calorie malnutrition, severe   Intractable pain   Pressure injury  of skin   Acute on chronic systolic CHF (congestive heart failure) (HCC)   On mechanically assisted ventilation (HCC)   Hypertensive urgency  Acute hypoxic respiratory failure- H/o afib and remote PE Right pleural effusion  MRSA PNA, recurrent 12/10 with hemoptysis, some combination volume-pulmonary Edema, HCAP, ARDS +MRSA PCR and + BAL 12/10; meropenem  stopped 12/12 -remains comfortable on RA - aggressive pulm hygiene - Completed Linezolid  12/10-12/17   Abdominal pain- distended on exam and firm. Hypoactive bowel sounds. Actively getting peg feed but has not made pain worse. Pain began before the feed. Did not eat breakfast. Requesting pain medicine. Had small bm this am. No nausea or vomiting.  - concern for ileus or obstruction, high amount of pain medications.  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  He has completed rounds of antibiotics.  Mild interstitial fibrosis/tubular atrophy. 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 Recs for carafate  1g QID for 2 months per GI  - Recurrent pain surrounding the PEG, intermittent and improves with pain medication - GI currently signed off for now, if suspect leak or evidence, would discuss with GI/TCTS   Acute on chronic HFpEF HTN HLD PAF History of PE ECHO 02/08/24- 60 to 65%, LV normal function, no regional wall abnormalities, mild concentric LV hypertrophy, RV systolic function normal, RV normal; TTE 12/10 EF 55-60%, +WMA- basal  inferior akinesis and basal inferolateral akinesis, RV systolic mildly reduced -Appreciate heart failure following -Blood pressure okay off Cleviprex . - continue minoxidil , consider resuming clonidine  if needed, defer Entresto  resumption to heart failure  -continue Amiodarone  200mg  BID. HR in 90s during exam -Heparin  drip and will transition to DOAC once no more procedures planned - GDMT as able  - con't statin, zetia   - NO hydralazine   - unna boots   Prior DVT/PE Superficial LUE thrombus - heparin  gtt> eliquis  12/19   Acute on chronic anemia -No bleeding and stable hemoglobin - transfuse for Hgb > 8   Severe malnutrition Severe deconditioning -Has PEG, continue TF, plan for calorie count - MVI - PT/ OT as able   Hypophosphatemia - Trend and replete as needed   Decubitus ulcers -Wound management   Pain  Insomnia  Anxiety Concern for developing delirium ImprovING - cont  oxy 10mg  q4 prn, dilaudid  for severe pain - Continue klonopin  0.5mg  BID and seroquel  25mg  BID and prn trazodone  at bedtime- -continue delirium precautions     Hyperglycemia - A1c 6 01/2024, pre-diabetic, likely exacerbated by critical illness/ steroids - goal 140-180 - cont CBG q 4 with prn sSSI - semglee  10u daily    GOC- not ready for PMT talks, wants to continue full scope of care  Per documentation, guarded prognosis shared with  Son- patient wants everything done  Body mass index is 22.8 kg/m.  VTE ppx: Place TED hose Start: 02/27/24 1903 SCDs Start: 01/21/24 2023  Diet:     Diet   Diet regular Room service appropriate? Yes; Fluid consistency: Thin   Consultants: PCCM Cardiology, heart failure IR Nephrology  Vascular surgery   Subjective 04/05/2024    Pt reports significant abdominal pain. Its continuous and not worsened by the tube feeds. Had no appetite and didn't eat breakfast. No nausea or vomiting. Last bm this am and he states it was small.    Objective  Blood pressure (!) 185/68, pulse (!) 104, temperature 98.8 F (37.1 C), resp. rate (!) 24, height 6' 1 (1.854 m), weight 78.4 kg, SpO2 (P) 99%.  Intake/Output Summary (Last 24 hours) at 04/05/2024 0739 Last data filed at 04/05/2024 0500 Gross per 24 hour  Intake 533.24 ml  Output 805 ml  Net -271.76 ml   Filed Weights   04/02/24 0500 04/03/24 0600 04/04/24 0600  Weight: 77 kg 76.8 kg 78.4 kg    Physical Exam:  General: awake, alert, NAD HEENT: atraumatic, clear conjunctiva, anicteric sclera, MMM, hearing grossly normal Respiratory: normal respiratory effort. Cardiovascular: extremities well perfused, quick capillary refill, normal S1/S2, RRR, no JVD, murmurs Gastrointestinal: firm, distended. Tender to palpation. No resistance with the tube feed Nervous: A&O x3. no gross focal neurologic deficits, normal speech Extremities: moves all equally, no edema, normal tone Skin: dry, intact, normal temperature, normal color. No rashes, lesions or ulcers on exposed skin Psychiatry: normal mood, congruent affect  Labs   I have personally reviewed the following labs and imaging studies CBC    Component Value Date/Time   WBC 26.8 (H) 04/05/2024 0415   RBC 3.20 (L) 04/05/2024 0415   HGB 8.7 (L) 04/05/2024 0415   HGB 12.0 (L) 09/27/2023 0846   HCT 26.6 (L) 04/05/2024 0415   HCT 40.5 09/27/2023 0846   PLT 256 04/05/2024 0415   PLT 238  09/27/2023 0846   MCV 83.1 04/05/2024 0415   MCV 88 09/27/2023 0846   MCH 27.2 04/05/2024 0415   MCHC 32.7 04/05/2024 0415   RDW 16.5 (H) 04/05/2024 0415   RDW 17.3 (H) 09/27/2023 0846   LYMPHSABS 0.5 (L) 03/27/2024 0500   MONOABS 0.9 03/27/2024 0500   EOSABS 0.1 03/27/2024 0500   BASOSABS 0.0 03/27/2024 0500      Latest Ref Rng & Units 04/05/2024    4:15 AM 04/04/2024    4:10 AM 04/03/2024    5:17 AM  BMP  Glucose 70 - 99 mg/dL 837  876  846   BUN 8 - 23 mg/dL 898  80  53   Creatinine 0.61 - 1.24 mg/dL 5.66  6.36  7.42   Sodium 135 - 145 mmol/L 134  132  133   Potassium 3.5 - 5.1 mmol/L 5.3  5.0  4.9   Chloride 98 - 111 mmol/L 99  98  98   CO2 22 - 32 mmol/L 21  22  26  Calcium  8.9 - 10.3 mg/dL 8.3  7.7  7.9     IR TUNNELED CENTRAL VENOUS CATH [facility] W IMG Result Date: 04/04/2024 INDICATION: 73 year old with acute on chronic kidney disease. EXAM: FLUOROSCOPIC AND ULTRASOUND GUIDED PLACEMENT OF A TUNNELED DIALYSIS CATHETER Physician: [name] SAUNDERS. Philip, MD MEDICATIONS: Ancef  2 g; The antibiotic was administered within an appropriate time interval prior to skin puncture. ANESTHESIA/SEDATION: Moderate (conscious) sedation was employed during this procedure. A total of Versed  1 mg and fentanyl  75 mcg was administered intravenously at the order of the provider performing the procedure. Total intra-service moderate sedation time: 23 minutes. Patient's level of consciousness and vital signs were monitored continuously by radiology nurse throughout the procedure under the supervision of the provider performing the procedure. FLUOROSCOPY TIME:  Radiation Exposure Index (as provided by the fluoroscopic device): 9 mGy Kerma COMPLICATIONS: None immediate. PROCEDURE: Informed consent was obtained for placement of a tunneled dialysis catheter. The patient was placed supine on the interventional table. Ultrasound confirmed a patent right internal jugular vein. Ultrasound image obtained for documentation. The  right neck and chest was prepped and draped in a sterile fashion. Maximal barrier sterile technique was utilized including caps, mask, sterile gowns, sterile gloves, sterile drape, hand hygiene and skin antiseptic. The right neck was anesthetized with 1% lidocaine . A small incision was made with #11 blade scalpel. A 21 gauge needle directed into the right internal jugular vein with ultrasound guidance. A micropuncture dilator set was placed. A 23 cm tip to cuff Palindrome catheter was selected. The skin below the right clavicle was anesthetized and a small incision was made with an #11 blade scalpel. A subcutaneous tunnel was formed to the vein dermatotomy site. The catheter was brought through the tunnel. The vein dermatotomy site was dilated to accommodate a peel-away sheath over a wire. The catheter was placed through the peel-away sheath and directed into the central venous structures. The tip of the catheter was placed at superior cavoatrial junction with fluoroscopy. Fluoroscopic images were obtained for documentation. Both lumens were found to aspirate and flush well. The proper amount of heparin  was flushed in both lumens. The vein dermatotomy site was closed using a single layer of absorbable suture and Dermabond. Gel-Foam was placed in the subcutaneous tract. The catheter was secured to the skin using Prolene suture. IMPRESSION: Successful placement of a right jugular tunneled dialysis catheter using ultrasound and fluoroscopic guidance. Electronically Signed   By: [name] Philip M.D.   On: 04/04/2024 17:01   US  RENAL Result Date: 04/03/2024 EXAM: US  Retroperitoneum Complete, Renal. 04/03/2024 08:51:48 AM TECHNIQUE: Real-time ultrasonography of the retroperitoneum renal was performed. COMPARISON: US  Renal 03/26/2024; 07/04/2023. CLINICAL HISTORY: Abdominal pain. FINDINGS: FINDINGS: RIGHT KIDNEY/URETER: Right kidney measures 10.8 x 5.8 x 5.3 cm. Normal cortical echogenicity. No hydronephrosis. No calculus.  No mass. LEFT KIDNEY/URETER: Left kidney measures 7.5 x 3.9 x 2.8 cm. The left kidney is relatively atrophic and echogenic, but is suboptimally visualized. No hydronephrosis. No calculus. No mass. BLADDER: Unremarkable appearance of the bladder. IMPRESSION: 1. Relatively atrophic and echogenic left kidney, suboptimally visualized. Electronically signed by: Evalene Coho MD 04/03/2024 09:14 AM EST RP Workstation: [id number]   Disposition Plan & Communication  Patient status: Inpatient  Admitted From: Home Planned disposition location: Skilled nursing facility Anticipated discharge date: TBD pending renal function  Family Communication: none at bedside    Author: Marien LITTIE Piety, DO [facility] 04/05/2024, 7:39 AM   Available by Epic secure chat 7AM-7PM. If 7PM-7AM, please contact night-coverage.  [name] contact information found on [email or web address].  "

## 2024-04-05 NOTE — Progress Notes (Incomplete)
 RN notified pharmacist and Heparin  gtt was stopped per verbal instructions. Notified Dr. Cathern.

## 2024-04-05 NOTE — Progress Notes (Signed)
 [name] night cross cover note:   I was notified by the patient's RN that there is some bleeding around the site of the HD catheter that was placed yesterday afternoon around 1600.  Heparin  drip has been held in response to this.Most recent vital signs appear stable, with heart rates in the 90s, systolic blood pressures in the 140s.  CBC that was drawn around 415 this morning shows stable hemoglobin of 8.7 compared to most recent prior value for 8.4 when drawn yesterday morning.    I have ordered thrombi pads to be applied with pressure to the HD  cath site to try to  establish hemostasis.  I have also ordered an updated type and screen as well as a repeat H&H to be checked around 9 AM this morning.     Eva Pore, DO Hospitalist

## 2024-04-05 NOTE — Plan of Care (Signed)
  Problem: Education: Goal: Knowledge of General Education information will improve Description: Including pain rating scale, medication(s)/side effects and non-pharmacologic comfort measures Outcome: Progressing   Problem: Health Behavior/Discharge Planning: Goal: Ability to manage health-related needs will improve Outcome: Progressing   Problem: Clinical Measurements: Goal: Will remain free from infection Outcome: Progressing Goal: Diagnostic test results will improve Outcome: Progressing Goal: Respiratory complications will improve Outcome: Progressing Goal: Cardiovascular complication will be avoided Outcome: Progressing   Problem: Activity: Goal: Risk for activity intolerance will decrease Outcome: Progressing   Problem: Nutrition: Goal: Adequate nutrition will be maintained Outcome: Progressing   Problem: Coping: Goal: Level of anxiety will decrease Outcome: Progressing   Problem: Elimination: Goal: Will not experience complications related to bowel motility Outcome: Progressing Goal: Will not experience complications related to urinary retention Outcome: Progressing   Problem: Pain Managment: Goal: General experience of comfort will improve and/or be controlled Outcome: Progressing   Problem: Safety: Goal: Ability to remain free from injury will improve Outcome: Progressing   Problem: Skin Integrity: Goal: Risk for impaired skin integrity will decrease Outcome: Progressing   Problem: Education: Goal: Ability to describe self-care measures that may prevent or decrease complications (Diabetes Survival Skills Education) will improve Outcome: Progressing   Problem: Coping: Goal: Ability to adjust to condition or change in health will improve Outcome: Progressing   Problem: Fluid Volume: Goal: Ability to maintain a balanced intake and output will improve Outcome: Progressing   Problem: Health Behavior/Discharge Planning: Goal: Ability to identify and  utilize available resources and services will improve Outcome: Progressing Goal: Ability to manage health-related needs will improve Outcome: Progressing   Problem: Metabolic: Goal: Ability to maintain appropriate glucose levels will improve Outcome: Progressing   Problem: Nutritional: Goal: Maintenance of adequate nutrition will improve Outcome: Progressing Goal: Progress toward achieving an optimal weight will improve Outcome: Progressing   Problem: Skin Integrity: Goal: Risk for impaired skin integrity will decrease Outcome: Progressing   Problem: Tissue Perfusion: Goal: Adequacy of tissue perfusion will improve Outcome: Progressing   Problem: Activity: Goal: Ability to tolerate increased activity will improve Outcome: Progressing   Problem: Respiratory: Goal: Ability to maintain a clear airway and adequate ventilation will improve Outcome: Progressing   Problem: Role Relationship: Goal: Method of communication will improve Outcome: Progressing   Problem: Education: Goal: Ability to describe self-care measures that may prevent or decrease complications (Diabetes Survival Skills Education) will improve Outcome: Progressing Goal: Individualized Educational Video(s) Outcome: Progressing   Problem: Coping: Goal: Ability to adjust to condition or change in health will improve Outcome: Progressing   Problem: Fluid Volume: Goal: Ability to maintain a balanced intake and output will improve Outcome: Progressing   Problem: Health Behavior/Discharge Planning: Goal: Ability to identify and utilize available resources and services will improve Outcome: Progressing Goal: Ability to manage health-related needs will improve Outcome: Progressing   Problem: Metabolic: Goal: Ability to maintain appropriate glucose levels will improve Outcome: Progressing   Problem: Nutritional: Goal: Maintenance of adequate nutrition will improve Outcome: Progressing Goal: Progress  toward achieving an optimal weight will improve Outcome: Progressing   Problem: Skin Integrity: Goal: Risk for impaired skin integrity will decrease Outcome: Progressing   Problem: Tissue Perfusion: Goal: Adequacy of tissue perfusion will improve Outcome: Progressing

## 2024-04-05 NOTE — Progress Notes (Signed)
 Brief Nutrition Support Note   Calorie count initiated 12/18 to assess pt's continued need for tube feeds. Pt only had 1 meal during this time before being made NPO again due to abdomen discomfort and distention requiring follow up KUB. Pt remains NPO following KUB and calorie count discontinued. Will continue to monitor and re-initiate count when appropriate.   Pt has history of tolerating tube feeds via PEG but not tolerating PO intake. Most recently, pt ate 100% of dinner 12/18 then had documented emesis and nausea the morning of 12/19. Recommend continuing tube feeds via PEG until PO intake is more tolerated. Previously discussed transitioning to nocturnal feeds but no longer recommend given pt's NPO status and distention. See intervention recommendations outlined below.   INTERVENTION:  Discontinue ensure and calorie count until appropriate per diet advancement  Tube Feeding via PEG: continue current TF regimen Vital 1.5 at 65 ml/hr Pro-Source TF20 60 mL BID TF at goal rate provides 145 g of protein, 2500 kcals, 1186 mL of free water  over 24 hours     NUTRITION DIAGNOSIS:  Severe Malnutrition related to acute illness (may have chronic component due to time frame of reported wt loss) as evidenced by severe fat depletion, moderate muscle depletion.   Being addressed via TF   GOAL:  Patient will meet greater than or equal to 90% of their needs   Progressing     Josette Glance, MS, RDN, LDN Clinical Dietitian I Please reach out via secure chat

## 2024-04-05 NOTE — Progress Notes (Signed)
Patient is being transported to dialysis.

## 2024-04-06 DIAGNOSIS — K223 Perforation of esophagus: Secondary | ICD-10-CM | POA: Diagnosis not present

## 2024-04-06 LAB — CBC
HCT: 22.4 % — ABNORMAL LOW (ref 39.0–52.0)
Hemoglobin: 7.1 g/dL — ABNORMAL LOW (ref 13.0–17.0)
MCH: 26 pg (ref 26.0–34.0)
MCHC: 31.7 g/dL (ref 30.0–36.0)
MCV: 82.1 fL (ref 80.0–100.0)
Platelets: 203 K/uL (ref 150–400)
RBC: 2.73 MIL/uL — ABNORMAL LOW (ref 4.22–5.81)
RDW: 16.8 % — ABNORMAL HIGH (ref 11.5–15.5)
WBC: 16.3 K/uL — ABNORMAL HIGH (ref 4.0–10.5)
nRBC: 0 % (ref 0.0–0.2)

## 2024-04-06 LAB — GLUCOSE, CAPILLARY
Glucose-Capillary: 110 mg/dL — ABNORMAL HIGH (ref 70–99)
Glucose-Capillary: 150 mg/dL — ABNORMAL HIGH (ref 70–99)
Glucose-Capillary: 173 mg/dL — ABNORMAL HIGH (ref 70–99)
Glucose-Capillary: 193 mg/dL — ABNORMAL HIGH (ref 70–99)
Glucose-Capillary: 194 mg/dL — ABNORMAL HIGH (ref 70–99)
Glucose-Capillary: 99 mg/dL (ref 70–99)

## 2024-04-06 LAB — RENAL FUNCTION PANEL
Albumin: 2.3 g/dL — ABNORMAL LOW (ref 3.5–5.0)
Anion gap: 9 (ref 5–15)
BUN: 69 mg/dL — ABNORMAL HIGH (ref 8–23)
CO2: 28 mmol/L (ref 22–32)
Calcium: 8 mg/dL — ABNORMAL LOW (ref 8.9–10.3)
Chloride: 96 mmol/L — ABNORMAL LOW (ref 98–111)
Creatinine, Ser: 2.92 mg/dL — ABNORMAL HIGH (ref 0.61–1.24)
GFR, Estimated: 22 mL/min — ABNORMAL LOW
Glucose, Bld: 147 mg/dL — ABNORMAL HIGH (ref 70–99)
Phosphorus: 3.7 mg/dL (ref 2.5–4.6)
Potassium: 4.4 mmol/L (ref 3.5–5.1)
Sodium: 133 mmol/L — ABNORMAL LOW (ref 135–145)

## 2024-04-06 MED ORDER — HEPARIN SODIUM (PORCINE) 1000 UNIT/ML IJ SOLN
INTRAMUSCULAR | Status: AC
  Filled 2024-04-06: qty 4

## 2024-04-06 NOTE — Progress Notes (Signed)
 " Progress Note   Patient: Edyn Qazi FMW:[id number] DOB: 08/26/1950 DOA: 01/21/2024     73 DOS: the patient was seen and examined on 04/06/2024        Brief hospital course: 73 y.o. 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  Given KCentra  in ER, TCTS consulted.  Slwo recovery.  Chest tubes remained in place for 1 month, esophageal stent for 6 weeks.    Hospitalization later complicated by renal failure requiring dialysis and abdominal pain from ischemic duodenal ulcers.      10/5 admit 10/7 esophageal stent 10/15 EGD and stent repositioning, likely aspiration and left intubated  10/16  successfully extubated. POCUS right effusion. Small bore CT placed. ~300 ml; gm stain neg, exudate by light's criteria. Lots of challenge w/ pain. Getting Dilaudid  and dex gtt.  10/17 marked pain still. Changing med regimen to ketamine  w/ scheduled orals and decreased freq of dilaudid   10/18 Precedex  was titrated off, patient is on low-dose ketamine  at 0.3 mg/kg/h.  Continue complain of diffuse pain.  Complaining of anxiety, started on Xanax  3 times daily as needed 10/19 ketamine  infusion was stopped, remained afebrile, continue to complain of shortness of breath with increased oxygen  requirement, at 10 L.  Chest tube output was minimal 10/20 patient was intubated and placed on mechanical ventilation.  Bedside ultrasound showed left-sided pleural effusion which was organized, left-sided chest tube was placed with exudative output of 120 cc Esophagram of 10/24 showed persistent leak despite being covered by esophageal stent by CT surgery will continue monitoring drainage for now 10/26 PCCM signed off.  11/3 PEG placed in OR 11/4 wt up 5 lbs getting lasix   11/5 entresto   resumed 11/6 cr climbing again. Diuretics held  11/7 pt CT pulled out  11/11 left chest tube removed 11/19 SVT in cephalic vein  11/26 stent removed. Esophagram negative for esophageal leak.  11/27 getting diuresis again 11/28 duodenitis/enteritis.  GI consulted. Bloody emesis added carafate   11/29 trialing clears 12/1 NV again w/ hematemesis. Lasix  held  12/2 EGD ischemic ulcers. Impression:  - Congested, inflamed, nodular, texture changed  mucosa in the esophagus. - Gastritis, Non-bleeding duodenal ulcers with a clean ulcer  base (Forrest Class III).- Non-bleeding jejunal ulcers with a clean ulcer   base (Forrest Class III)  - Findings suggestive of ischemic ulcers. Increased carafate  and cont BID PPI  12/3 full liq diet  12/4 marked increased WOB. Acute and progressive w/ increased hypoxia and HTN. PCCM asked to see  12/10 again increased WOB, stubborn to diuresis, unable to oxygenate, to ICU 12/11 Intubated upon arrival to ICU yesterday, HD/Aline/Bronch  12/12 extubated 12/13 c/o of epigastric pain but suspect more anxiety component, on cleviprex , CRRT for volume removal 12/15 pain improved, on RA, on clev and CRRT, renal bx pending 12/16 renal biopsy 12/17 off clev transition to iHD, transfer to floor  12/18: TDC placed 12/19 [name] assumed care, has distended and painful abdomen today, KUB shows improvement so tube feeds resumed, Eliquis  resumed      Assessment and Plan: Abdominal pain KUB overnight showed improving ileus.  Today pain normal - Advance diet - Given ischemic duodenitis noted below, would have low treshold to CT abdomen for any recurrent abdominal pain     AKI on CKD stage 3B Oliguria Patient  has pre-existing renal artery stenosis and hypertension, developed renal failure in early Dec.  Nephrology consulted. Underwent CRRT 12/10-12/16, TDC placed 12/18, currently on dialysis.  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  It appears that there is some  diagnostic ambiguity, infection related immune complex disease versus IgA vasculitis - Continue prednisone  taper per nephrology - HD per nephrology    Petechial rash Hydralazine  and Unasyn  have been stopped, cannot rule out HSP - Monitor rash   Boerhaave syndrome  Esophageal rupture Initial presenting complaint. Admitted and cardiothoracic surgery were consulted.  Esophageal stent was placed.  Patient later had organizing pleural effusions requiring bilateral chest tubes.  Eventually chest tubes were able to be removed, and esophageal stent was removed without evidence of extravasation on esophagram in the OR in late November.  Repeat esophagram 12/1 with no leak.  Subsequent EGD 12/2.   - Plan to leave PEG until outpatient setting - Will need CT surgery follow up outpatient     Severe gastroenteritis Ischemic ulcer disease CT abdomen pelvis 11/25 obtained in the setting of abdominal pain, showed severe new duodenitis.  Cardiothoracic surgery and GI reengaged.  Repeat CT 12/1 showed persistent circumferential thickening of duodenum jejunum.  EGD 12/2 showed severe gastritis, multiple nonbleeding superficial duodenal and jejunal ischemic ulcers.  Last CT abdomen was on 12/7, showed persistent duodenitis, discussed with GI, no change to plan: - Continue indefinite PPI twice daily -Continue sucralfate  1 g 4 times daily x 2 months - Will need follow-up with [facility] gastroenterology as an outpatient     Normocytic anemia Due to severe peptic ulcer disease.  Transfuse 1 unit in October, another unit 12/12.  Hemoglobin dropped since resuming Eliquis  12/19 - Hold Eliquis  - Trend CBC - Continue PPI and sucralfate        Severe Malnutrition S/p PEG Patient had been on TPN previously, stopped on 12/4 after EGD, and advance diet.  In the last 24 hours, diet was held again given abdominal pain and concern for ileus. - Resume oral diet - Calorie count - For now continue tube feeds via  PEG - Glargine and sliding scale corrections while on tube feed   Paroxysmal A-fib  History of cardioversion March 2025. - Continue amiodarone  - Hold Eliquis  given drop   History of PE with DVTs History of IVC filter March 2025. 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  Proved with Lasix  and antibiotics.  Not currently on diuretics.    Bipolar Chart history, no treatment history that I can see, not in old notes.   Bilateral UE swelling Superficial venous thrombosis  Prsent for some weeks.  Doppler US  ruled out DVT.  There is some superficial thrombosed veins.    - No further work up needed unless change   Lung nodules Incidental finding - Need repeat CT in Jan/Feb window    History of Cardiac arrest in Mar 2025  Goals of Care Has expressed previously that he is not interested in speaking with Palliative Care      Subjective: Feeling better today.  No distension or pain.  Pain is described to me as around his PEG tube, not diffuse or deep.  No dyspnea, no confusion, no fever, no respiratory symptoms.  Physical Exam: BP (!) 122/55 (BP Location: Left Arm)   Pulse 79   Temp 98.4 F (36.9 C) (Oral)   Resp 16   Ht 6' 1 (1.854 m)   Wt 77.5 kg   SpO2 96%   BMI 22.54 kg/m   Chroncially ill apppearing, lying in bed, sleping, rouses, oriented, answers questrions RRR no murmurs, diffuse nonpitting edema Respiraotyr rate normal, depth shallow, diminished, not good air movement Abdomen soft, difufsely uncomfortable and guarding, but denies pain Oriented x3, weak severly, barely able to lift against gravity    Data  Reviewed: Extensive records reviewed BMP shows hyponatremia, elevated Creatinine Hgb down to 7.1 WBC stable   Family Communication:     Disposition: Status is: Inpatient Suspect LTAC candidate        Author: Lonni SHAUNNA Dalton, MD 04/06/2024 5:01 PM  For on call review [email or web address].    "

## 2024-04-06 NOTE — Plan of Care (Signed)
" °  Problem: Education: Goal: Knowledge of General Education information will improve Description: Including pain rating scale, medication(s)/side effects and non-pharmacologic comfort measures Outcome: Progressing   Problem: Health Behavior/Discharge Planning: Goal: Ability to manage health-related needs will improve Outcome: Progressing   Problem: Clinical Measurements: Goal: Will remain free from infection Outcome: Progressing Goal: Diagnostic test results will improve Outcome: Progressing Goal: Respiratory complications will improve Outcome: Progressing Goal: Cardiovascular complication will be avoided Outcome: Progressing   Problem: Activity: Goal: Risk for activity intolerance will decrease Outcome: Progressing   Problem: Nutrition: Goal: Adequate nutrition will be maintained Outcome: Progressing   Problem: Coping: Goal: Level of anxiety will decrease Outcome: Progressing   Problem: Elimination: Goal: Will not experience complications related to bowel motility Outcome: Progressing Goal: Will not experience complications related to urinary retention Outcome: Progressing   Problem: Pain Managment: Goal: General experience of comfort will improve and/or be controlled Outcome: Progressing   Problem: Safety: Goal: Ability to remain free from injury will improve Outcome: Progressing   Problem: Skin Integrity: Goal: Risk for impaired skin integrity will decrease Outcome: Progressing   Problem: Education: Goal: Ability to describe self-care measures that may prevent or decrease complications (Diabetes Survival Skills Education) will improve Outcome: Progressing   Problem: Coping: Goal: Ability to adjust to condition or change in health will improve Outcome: Progressing   Problem: Fluid Volume: Goal: Ability to maintain a balanced intake and output will improve Outcome: Progressing   Problem: Health Behavior/Discharge Planning: Goal: Ability to identify and  utilize available resources and services will improve Outcome: Progressing Goal: Ability to manage health-related needs will improve Outcome: Progressing   Problem: Metabolic: Goal: Ability to maintain appropriate glucose levels will improve Outcome: Progressing   Problem: Nutritional: Goal: Maintenance of adequate nutrition will improve Outcome: Progressing Goal: Progress toward achieving an optimal weight will improve Outcome: Progressing   Problem: Skin Integrity: Goal: Risk for impaired skin integrity will decrease Outcome: Progressing   Problem: Tissue Perfusion: Goal: Adequacy of tissue perfusion will improve Outcome: Progressing   Problem: Education: Goal: Ability to describe self-care measures that may prevent or decrease complications (Diabetes Survival Skills Education) will improve Outcome: Progressing Goal: Individualized Educational Video(s) Outcome: Progressing   Problem: Coping: Goal: Ability to adjust to condition or change in health will improve Outcome: Progressing   Problem: Fluid Volume: Goal: Ability to maintain a balanced intake and output will improve Outcome: Progressing   Problem: Health Behavior/Discharge Planning: Goal: Ability to identify and utilize available resources and services will improve Outcome: Progressing Goal: Ability to manage health-related needs will improve Outcome: Progressing   Problem: Metabolic: Goal: Ability to maintain appropriate glucose levels will improve Outcome: Progressing   Problem: Nutritional: Goal: Maintenance of adequate nutrition will improve Outcome: Progressing Goal: Progress toward achieving an optimal weight will improve Outcome: Progressing   Problem: Skin Integrity: Goal: Risk for impaired skin integrity will decrease Outcome: Progressing   Problem: Tissue Perfusion: Goal: Adequacy of tissue perfusion will improve Outcome: Progressing   "

## 2024-04-06 NOTE — Plan of Care (Signed)
" °  Problem: Education: Goal: Knowledge of General Education information will improve Description: Including pain rating scale, medication(s)/side effects and non-pharmacologic comfort measures Outcome: Progressing   Problem: Coping: Goal: Level of anxiety will decrease Outcome: Progressing   Problem: Elimination: Goal: Will not experience complications related to bowel motility Outcome: Progressing Goal: Will not experience complications related to urinary retention Outcome: Progressing   Problem: Pain Managment: Goal: General experience of comfort will improve and/or be controlled Outcome: Not Progressing   "

## 2024-04-06 NOTE — Progress Notes (Signed)
 [name] night cross cover note:  Contacted by the patient's RN requesting clarification on whether or not the patient's continuous tube feeds would be resumed at this time following recent plain film of the abdomen.   I subsequently reviewed this updated plan from of the abdomen, which, per radiology, showed interval decrease in gaseous bowel distention, which was reported to be primarily in colon, without any evidence of bowel obstruction or perforation.   I subsequently placed order to resume pt's continuous tube feeds with previous VITAL 1.5 cal at 65 cc/hr.     Eva Pore, DO Hospitalist

## 2024-04-06 NOTE — Progress Notes (Signed)
 [facility] Progress Note   Assessment/ Plan:   AKI on CKD 3b             - HTN, backdrop of pretty bad L-sided RAS (occlusive)--> so essentially he has 1 functioning kidney and is more susceptible to volume status/ nephrotoxic agents             - off Losartan  and torsemide   -s/p CRRT 12/10-12/16  - ANA +, C4 low, + hematuria, anti-histone Ab +. 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  He has completed rounds of antibiotics.  Mild interstitial fibrosis/tubular atrophy. This biopsy does raise a concern for IgA Vasculitis/HSP given his clinical scenario however I don't believe adding MMF/Rituxan would be a good idea at this junction in the context of his MRSA pneumonia which he just finished abx for and esophageal perforation---risks>benefits. Also there is limited data with alternative therapies other than steroids. Overall, I do suspect this is more infection related given exudative appearance - on pred taper now -no evidence of renal recovery to date  -s/p [facility] 12/18. HD yesterday, will check on him tomorrow and determine if he needs HD tomorrow vs Monday  Abdominal pain -mgmt per primary service. improved   Petechial rash;             - appears like a drug rash but can't rule out HSP             - stopped hydralazine  & unasyn   -improving   Acute on chronic CHF exacerbation;             - stable vol status today, UF as tolerated with HD   H/o DVT and PE:             - had RH strain previously             - on eliquis    Boorehave's syndrome             - s/p repair             - Has a PEG  - EGD 03/19/24 with ulcers  - PPI and pepcid    HTN  -will UF as tolerated with HD  Dispo: inpt   Subjective:    Patient seen and examined. Tolerated HD yesterday with net uf 0.9L. Abd pain better today, rash  improving   Objective:   BP (!) 142/56 (BP Location: Left Arm)   Pulse 88   Temp 97.9 F (36.6 C) (Oral)   Resp 18   Ht 6' 1 (1.854 m)   Wt 77.5 kg   SpO2 98%   BMI 22.54 kg/m   Intake/Output Summary (Last 24 hours) at 04/06/2024 1109 Last data filed at 04/06/2024 0110 Gross per 24 hour  Intake --  Output 1300 ml  Net -1300 ml   Weight change:   Physical Exam: GEN: awake, ill appearing NECK: no JVD PULM: cta bl, unlabored, normal wob CV: RRR ABD: + PEG, NT and softer today EXT: trace edema b/l LEs SKIN: petechial nonblanching rash thighs, palms, arms---improving NEURO: awake, alert, following commands Dialysis access: RIJ temp HD catheter  Imaging: DG Abd 1 View Result Date: 04/05/2024 CLINICAL DATA:  Abdominal distension.  Pain, nausea. EXAM: ABDOMEN - 1 VIEW COMPARISON:  Radiograph 04/02/2024 FINDINGS: Gastrostomy tube projects over the left upper quadrant. IVC filter in place. Persistent but improving  gaseous bowel distension, remaining gaseous distention is primarily colon. No evidence of free air. Bilateral pleural effusions and bibasilar airspace disease, similar to prior. IMPRESSION: 1. Persistent but improving gaseous bowel distension, remaining gaseous distention is primarily colon. 2. Bilateral pleural effusions and bibasilar airspace disease, similar to prior. Electronically Signed   By: Andrea Gasman M.D.   On: 04/05/2024 21:12   IR TUNNELED CENTRAL VENOUS CATH [facility] IMG Result Date: 04/04/2024 INDICATION: 73 year old with acute on chronic kidney disease. EXAM: FLUOROSCOPIC AND ULTRASOUND GUIDED PLACEMENT OF A TUNNELED DIALYSIS CATHETER Physician: [name] SAUNDERS. Philip, MD MEDICATIONS: Ancef  2 g; The antibiotic was administered within an appropriate time interval prior to skin puncture. ANESTHESIA/SEDATION: Moderate (conscious) sedation was employed during this procedure. A total of Versed  1 mg and fentanyl  75 mcg was administered intravenously at the order of the  provider performing the procedure. Total intra-service moderate sedation time: 23 minutes. Patient's level of consciousness and vital signs were monitored continuously by radiology nurse throughout the procedure under the supervision of the provider performing the procedure. FLUOROSCOPY TIME:  Radiation Exposure Index (as provided by the fluoroscopic device): 9 mGy Kerma COMPLICATIONS: None immediate. PROCEDURE: Informed consent was obtained for placement of a tunneled dialysis catheter. The patient was placed supine on the interventional table. Ultrasound confirmed a patent right internal jugular vein. Ultrasound image obtained for documentation. The right neck and chest was prepped and draped in a sterile fashion. Maximal barrier sterile technique was utilized including caps, mask, sterile gowns, sterile gloves, sterile drape, hand hygiene and skin antiseptic. The right neck was anesthetized with 1% lidocaine . A small incision was made with #11 blade scalpel. A 21 gauge needle directed into the right internal jugular vein with ultrasound guidance. A micropuncture dilator set was placed. A 23 cm tip to cuff Palindrome catheter was selected. The skin below the right clavicle was anesthetized and a small incision was made with an #11 blade scalpel. A subcutaneous tunnel was formed to the vein dermatotomy site. The catheter was brought through the tunnel. The vein dermatotomy site was dilated to accommodate a peel-away sheath over a wire. The catheter was placed through the peel-away sheath and directed into the central venous structures. The tip of the catheter was placed at superior cavoatrial junction with fluoroscopy. Fluoroscopic images were obtained for documentation. Both lumens were found to aspirate and flush well. The proper amount of heparin  was flushed in both lumens. The vein dermatotomy site was closed using a single layer of absorbable suture and Dermabond. Gel-Foam was placed in the subcutaneous tract.  The catheter was secured to the skin using Prolene suture. IMPRESSION: Successful placement of a right jugular tunneled dialysis catheter using ultrasound and fluoroscopic guidance. Electronically Signed   By: [name] Philip M.D.   On: 04/04/2024 17:01     Labs: BMET Recent Labs  Lab 04/01/24 0428 04/01/24 1527 04/02/24 0425 04/03/24 0517 04/04/24 0410 04/05/24 0415 04/06/24 0900  NA 132* 134* 130* 133* 132* 134* 133*  K 4.4 4.8 4.4 4.9 5.0 5.3* 4.4  CL 99 103 97* 98 98 99 96*  CO2 25 25 25 26 22  21* 28  GLUCOSE 95 161* 213* 153* 123* 162* 147*  BUN 28* 29* 26* 53* 80* 101* 69*  CREATININE 1.09 1.29* 1.13 2.57* 3.63* 4.33* 2.92*  CALCIUM  7.4* 7.3* 7.2* 7.9* 7.7* 8.3* 8.0*  PHOS 2.5 2.8 2.5 3.6 4.3 4.9* 3.7   CBC Recent Labs  Lab 04/03/24 0816 04/04/24 0410 04/05/24 0415 04/05/24 0921 04/06/24 0900  WBC 17.3* 22.1* 26.8*  --  16.3*  HGB 8.0* 8.4* 8.7* 8.9* 7.1*  HCT 24.7* 26.0* 26.6* 26.7* 22.4*  MCV 83.4 83.6 83.1  --  82.1  PLT 221 240 256  --  203    Medications:     amiodarone   200 mg Per Tube BID   apixaban   5 mg Oral BID   atorvastatin   80 mg Per Tube Daily   Chlorhexidine  Gluconate Cloth  6 each Topical Daily   clonazePAM   0.5 mg Per Tube BID   ezetimibe   10 mg Per Tube Daily   feeding supplement (PROSource TF20)  60 mL Per Tube BID   fiber  1 packet Per Tube BID   [name] butt cream   Topical BID   guaiFENesin   10 mL Per Tube Q4H   insulin  aspart  0-9 Units Subcutaneous Q4H   insulin  glargine  10 Units Subcutaneous Daily   lidocaine   1 patch Transdermal Q24H   minoxidil   10 mg Per Tube Daily   multivitamin  1 tablet Per Tube QHS   pantoprazole  (PROTONIX ) IV  40 mg Intravenous Q12H   [START ON 04/07/2024] predniSONE   40 mg Oral Q breakfast   Followed by   NOREEN ON 04/14/2024] predniSONE   20 mg Oral Q breakfast   Followed by   NOREEN ON 04/21/2024] predniSONE   10 mg Oral Q breakfast   QUEtiapine   25 mg Per Tube BID   saccharomyces boulardii  250 mg  Per Tube BID   sodium chloride  flush  10-40 mL Intracatheter Q12H    Ephriam Stank, MD [facility] 04/06/2024, 11:09 AM

## 2024-04-06 NOTE — Progress Notes (Signed)
 Received patient in bed to unit.  Alert and oriented.  Informed consent signed and in chart.   TX duration: 3  Patient tolerated well. Patient in Aflutter, Hypotensive at times, asymptomatic, Fluid removal goal decreased Transported back to the room  Alert, without acute distress.  Hand-off given to patient's nurse. Shift RN  Access used: Dialysis Catheter Access issues: None  Total UF removed: 900 Medication(s) given: None Post HD VS: [id number]-B/P131/69 Post HD weight: 77.5kg  Neville Seip, RN [facility]   04/06/24 0110  Vitals  Temp 98 F (36.7 C)  Temp Source Oral  BP 131/69  MAP (mmHg) 86  BP Location Right Arm  BP Method Automatic  Patient Position (if appropriate) Lying  Pulse Rate 95  Pulse Rate Source Monitor  ECG Heart Rate 92  Resp 16  Weight 77.5 kg  Type of Weight Post-Dialysis  Oxygen  Therapy  SpO2 99 %  O2 Device Room Air  Patient Activity (if Appropriate) In bed  During Treatment Monitoring  Blood Flow Rate (mL/min) 0 mL/min  Arterial Pressure (mmHg) -27.07 mmHg  Venous Pressure (mmHg) 37.98 mmHg  TMP (mmHg) 0.8 mmHg  Ultrafiltration Rate (mL/min) 433 mL/min  Dialysate Flow Rate (mL/min) 299 ml/min  Dialysate Potassium Concentration 2  Dialysate Calcium  Concentration 2.5  Duration of HD Treatment -hour(s) 3 hour(s)  Cumulative Fluid Removed (mL) per Treatment  900.07  HD Safety Checks Performed Yes  Intra-Hemodialysis Comments Tx initiated;See progress note  Post Treatment  Dialyzer Clearance Lightly streaked  Liters Processed 65.4  Fluid Removed (mL) 900 mL  Tolerated HD Treatment Yes  Hemodialysis Catheter Right Internal jugular Double lumen Permanent (Tunneled)  Placement Date/Time: 04/04/24 1605   Serial / Lot #: [id number]  Expiration Date: 12/16/28  Time Out: Correct patient;Correct site;Correct procedure  Maximum sterile barrier precautions: Hand hygiene;Cap;Mask;Sterile gown;Sterile gloves;Large sterile s...  Site  Condition No complications  Blue Lumen Status Flushed;Antimicrobial dead end cap;Heparin  locked  Red Lumen Status Flushed;Antimicrobial dead end cap;Heparin  locked  Purple Lumen Status N/A  Catheter fill solution Heparin  1000 units/ml  Catheter fill volume (Arterial) 1.9 cc  Catheter fill volume (Venous) 1.9  Dressing Type Transparent  Dressing Status Antimicrobial disc/dressing in place;Clean, Dry, Intact  Drainage Description Black  Dressing Change Due 04/12/24  Post treatment catheter status Capped and Clamped

## 2024-04-07 ENCOUNTER — Inpatient Hospital Stay (HOSPITAL_COMMUNITY)

## 2024-04-07 DIAGNOSIS — I483 Typical atrial flutter: Secondary | ICD-10-CM

## 2024-04-07 DIAGNOSIS — K223 Perforation of esophagus: Secondary | ICD-10-CM | POA: Diagnosis not present

## 2024-04-07 LAB — CBC
HCT: 23.8 % — ABNORMAL LOW (ref 39.0–52.0)
Hemoglobin: 7.8 g/dL — ABNORMAL LOW (ref 13.0–17.0)
MCH: 26.7 pg (ref 26.0–34.0)
MCHC: 32.8 g/dL (ref 30.0–36.0)
MCV: 81.5 fL (ref 80.0–100.0)
Platelets: 226 K/uL (ref 150–400)
RBC: 2.92 MIL/uL — ABNORMAL LOW (ref 4.22–5.81)
RDW: 16.9 % — ABNORMAL HIGH (ref 11.5–15.5)
WBC: 18.2 K/uL — ABNORMAL HIGH (ref 4.0–10.5)
nRBC: 0 % (ref 0.0–0.2)

## 2024-04-07 LAB — RENAL FUNCTION PANEL
Albumin: 2.5 g/dL — ABNORMAL LOW (ref 3.5–5.0)
Anion gap: 12 (ref 5–15)
BUN: 96 mg/dL — ABNORMAL HIGH (ref 8–23)
CO2: 26 mmol/L (ref 22–32)
Calcium: 8.1 mg/dL — ABNORMAL LOW (ref 8.9–10.3)
Chloride: 96 mmol/L — ABNORMAL LOW (ref 98–111)
Creatinine, Ser: 3.64 mg/dL — ABNORMAL HIGH (ref 0.61–1.24)
GFR, Estimated: 17 mL/min — ABNORMAL LOW
Glucose, Bld: 147 mg/dL — ABNORMAL HIGH (ref 70–99)
Phosphorus: 4.4 mg/dL (ref 2.5–4.6)
Potassium: 4.7 mmol/L (ref 3.5–5.1)
Sodium: 134 mmol/L — ABNORMAL LOW (ref 135–145)

## 2024-04-07 LAB — BASIC METABOLIC PANEL WITH GFR
Anion gap: 9 (ref 5–15)
BUN: 85 mg/dL — ABNORMAL HIGH (ref 8–23)
CO2: 28 mmol/L (ref 22–32)
Calcium: 7.9 mg/dL — ABNORMAL LOW (ref 8.9–10.3)
Chloride: 95 mmol/L — ABNORMAL LOW (ref 98–111)
Creatinine, Ser: 3.2 mg/dL — ABNORMAL HIGH (ref 0.61–1.24)
GFR, Estimated: 20 mL/min — ABNORMAL LOW
Glucose, Bld: 105 mg/dL — ABNORMAL HIGH (ref 70–99)
Potassium: 4.8 mmol/L (ref 3.5–5.1)
Sodium: 132 mmol/L — ABNORMAL LOW (ref 135–145)

## 2024-04-07 LAB — GLUCOSE, CAPILLARY
Glucose-Capillary: 135 mg/dL — ABNORMAL HIGH (ref 70–99)
Glucose-Capillary: 122 mg/dL — ABNORMAL HIGH (ref 70–99)
Glucose-Capillary: 200 mg/dL — ABNORMAL HIGH (ref 70–99)
Glucose-Capillary: 147 mg/dL — ABNORMAL HIGH (ref 70–99)
Glucose-Capillary: 118 mg/dL — ABNORMAL HIGH (ref 70–99)

## 2024-04-07 LAB — MAGNESIUM: Magnesium: 2.4 mg/dL (ref 1.7–2.4)

## 2024-04-07 MED ORDER — LIDOCAINE-PRILOCAINE 2.5-2.5 % EX CREA: 1.0000 | TOPICAL_CREAM | CUTANEOUS | Status: DC | PRN

## 2024-04-07 MED ORDER — LIDOCAINE HCL (PF) 1 % IJ SOLN: 5.0000 mL | INTRAMUSCULAR | Status: DC | PRN

## 2024-04-07 MED ORDER — CARVEDILOL 3.125 MG PO TABS
3.1250 mg | ORAL_TABLET | Freq: Two times a day (BID) | ORAL | Status: DC
  Administered 2024-04-08 – 2024-04-27 (×37): 3.125 mg
  Filled 2024-04-07 (×24): qty 1

## 2024-04-07 MED ORDER — HEPARIN SODIUM (PORCINE) 1000 UNIT/ML DIALYSIS: 1000.0000 [IU] | INTRAMUSCULAR | Status: DC | PRN

## 2024-04-07 MED ORDER — APIXABAN 5 MG PO TABS
5.0000 mg | ORAL_TABLET | Freq: Two times a day (BID) | ORAL | Status: DC
  Administered 2024-04-07: 5 mg
  Filled 2024-04-07: qty 1

## 2024-04-07 MED ORDER — PENTAFLUOROPROP-TETRAFLUOROETH EX AERO: 1.0000 | INHALATION_SPRAY | CUTANEOUS | Status: DC | PRN

## 2024-04-07 MED ORDER — IOHEXOL 350 MG/ML SOLN
75.0000 mL | Freq: Once | INTRAVENOUS | Status: AC | PRN
  Administered 2024-04-07: 75 mL via INTRAVENOUS

## 2024-04-07 MED ORDER — HYDROMORPHONE HCL 1 MG/ML IJ SOLN
0.5000 mg | Freq: Once | INTRAMUSCULAR | Status: AC
  Administered 2024-04-07: 0.5 mg via INTRAVENOUS
  Filled 2024-04-07: qty 0.5

## 2024-04-07 MED ORDER — FAMOTIDINE 20 MG PO TABS
40.0000 mg | ORAL_TABLET | Freq: Two times a day (BID) | ORAL | Status: DC
  Administered 2024-04-07 – 2024-04-08 (×2): 40 mg
  Filled 2024-04-07 (×2): qty 2

## 2024-04-07 MED ORDER — CARVEDILOL 3.125 MG PO TABS
3.1250 mg | ORAL_TABLET | Freq: Two times a day (BID) | ORAL | Status: DC
  Administered 2024-04-07: 3.125 mg via ORAL
  Filled 2024-04-07: qty 1

## 2024-04-07 MED ORDER — ANTICOAGULANT SODIUM CITRATE 4% (200MG/5ML) IV SOLN: 5.0000 mL | Status: DC | PRN

## 2024-04-07 MED ORDER — IOHEXOL 9 MG/ML PO SOLN
500.0000 mL | ORAL | Status: AC
  Administered 2024-04-07 (×2): 500 mL via ORAL
  Filled 2024-04-07 (×2): qty 500

## 2024-04-07 MED ORDER — FAMOTIDINE 20 MG PO TABS
20.0000 mg | ORAL_TABLET | Freq: Two times a day (BID) | ORAL | Status: DC | PRN
  Administered 2024-04-07: 20 mg via ORAL
  Filled 2024-04-07: qty 1

## 2024-04-07 MED ORDER — ALTEPLASE 2 MG IJ SOLR: 2.0000 mg | Freq: Once | INTRAMUSCULAR | Status: DC | PRN

## 2024-04-07 MED ORDER — PREDNISONE 20 MG PO TABS
20.0000 mg | ORAL_TABLET | Freq: Every day | ORAL | Status: AC
  Administered 2024-04-14 – 2024-04-20 (×6): 20 mg via ORAL
  Filled 2024-04-07: qty 1

## 2024-04-07 MED ORDER — FAMOTIDINE 20 MG PO TABS: 40.0000 mg | ORAL_TABLET | Freq: Two times a day (BID) | ORAL | Status: DC

## 2024-04-07 MED ORDER — PREDNISONE 10 MG PO TABS
10.0000 mg | ORAL_TABLET | Freq: Every day | ORAL | Status: DC
  Administered 2024-04-21 – 2024-04-26 (×6): 10 mg via ORAL
  Filled 2024-04-07 (×5): qty 1

## 2024-04-07 MED ORDER — IOHEXOL 9 MG/ML PO SOLN
ORAL | Status: AC
  Filled 2024-04-07: qty 1000

## 2024-04-07 MED ORDER — OXYCODONE HCL 5 MG PO TABS
10.0000 mg | ORAL_TABLET | ORAL | Status: DC | PRN
  Administered 2024-04-07 – 2024-04-18 (×15): 10 mg
  Filled 2024-04-07 (×11): qty 2

## 2024-04-07 MED ORDER — PREDNISONE 20 MG PO TABS
40.0000 mg | ORAL_TABLET | Freq: Every day | ORAL | Status: AC
  Administered 2024-04-08 – 2024-04-13 (×6): 40 mg
  Filled 2024-04-07 (×6): qty 2

## 2024-04-07 NOTE — Progress Notes (Signed)
 [name] night cross cover note:   I was notified by the patient's RN  of the pt's request for additional/stronger pain medication relative to existing orders for oxycodone  IR 10 mg per tube q4h prn and dilaudid  0.5 mg iv q3 hours prn.   I subsequently placed an order for a one time extra dose of dilaudid : dilaudid  0.5 mg iv x 1 dose now.      Eva Pore, DO Hospitalist

## 2024-04-07 NOTE — Progress Notes (Signed)
 [name] night cross cover note:   I was notified by the patient's RN that the patient is complaining of residual acid reflux/indigestion, refractory to Maalox/Mylanta.   RN conveys that the patient has an existing order for prn IV Pepcid  twice daily for reflux, but that the patient is now able to tolerate p.o.  I subsequently discontinued existing order for prn IV Pepcid  and replaced with order for Pepcid  20 mg p.o. twice daily as needed for acid reflux.     Eva Pore, DO Hospitalist
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
## 2024-04-07 NOTE — Procedures (Signed)
 I was present at this dialysis session. I have reviewed the session itself and made appropriate changes.   A lot of ectopy, per staff was possibly in SVT earlier, looks more like afib w/ rvr currently. He is asymptomatic, BP trending lower. Will terminate treatment, total 1.5hrs done so far. Will revisit for HD needs for tomorrow vs wait until tues.  Filed Weights   04/04/24 0600 04/05/24 2145 04/06/24 0110  Weight: 78.4 kg 78.4 kg 77.5 kg    Recent Labs  Lab 04/07/24 0252  NA 134*  K 4.7  CL 96*  CO2 26  GLUCOSE 147*  BUN 96*  CREATININE 3.64*  CALCIUM  8.1*  PHOS 4.4    Recent Labs  Lab 04/05/24 0415 04/05/24 0921 04/06/24 0900 04/07/24 0252  WBC 26.8*  --  16.3* 18.2*  HGB 8.7* 8.9* 7.1* 7.8*  HCT 26.6* 26.7* 22.4* 23.8*  MCV 83.1  --  82.1 81.5  PLT 256  --  203 226    Scheduled Meds:  amiodarone   200 mg Per Tube BID   apixaban   5 mg Oral BID   atorvastatin   80 mg Per Tube Daily   Chlorhexidine  Gluconate Cloth  6 each Topical Daily   clonazePAM   0.5 mg Per Tube BID   ezetimibe   10 mg Per Tube Daily   feeding supplement (PROSource TF20)  60 mL Per Tube BID   fiber  1 packet Per Tube BID   [name] butt cream   Topical BID   guaiFENesin   10 mL Per Tube Q4H   insulin  aspart  0-9 Units Subcutaneous Q4H   insulin  glargine  10 Units Subcutaneous Daily   iohexol   500 mL Oral Q1H   lidocaine   1 patch Transdermal Q24H   minoxidil   10 mg Per Tube Daily   multivitamin  1 tablet Per Tube QHS   pantoprazole  (PROTONIX ) IV  40 mg Intravenous Q12H   predniSONE   40 mg Oral Q breakfast   Followed by   NOREEN ON 04/14/2024] predniSONE   20 mg Oral Q breakfast   Followed by   NOREEN ON 04/21/2024] predniSONE   10 mg Oral Q breakfast   QUEtiapine   25 mg Per Tube BID   saccharomyces boulardii  250 mg Per Tube BID   sodium chloride  flush  10-40 mL Intracatheter Q12H   Continuous Infusions:  anticoagulant sodium citrate      feeding supplement (VITAL 1.5 CAL) 1,000 mL  (04/07/24 0000)   promethazine  (PHENERGAN ) injection (IM or IVPB) 12.5 mg (04/05/24 1153)   PRN Meds:.acetaminophen  (TYLENOL ) oral liquid 160 mg/5 mL, alteplase , alum & mag hydroxide-simeth, anticoagulant sodium citrate , diclofenac  Sodium, [COMPLETED] diphenoxylate -atropine  **FOLLOWED BY** diphenoxylate -atropine , famotidine , glucagon  (human recombinant), heparin , HYDROmorphone  (DILAUDID ) injection, hydrOXYzine , lidocaine  (PF), lidocaine -prilocaine , lip balm, mouth rinse, oxyCODONE , pentafluoroprop-tetrafluoroeth, polyethylene glycol, promethazine  (PHENERGAN ) injection (IM or IVPB), simethicone , sodium chloride , sodium chloride  flush, traZODone    Ephriam Stank, MD [facility] 04/07/2024, 11:49 AM

## 2024-04-07 NOTE — Plan of Care (Signed)
   Problem: Education: Goal: Knowledge of General Education information will improve Description Including pain rating scale, medication(s)/side effects and non-pharmacologic comfort measures Outcome: Progressing

## 2024-04-07 NOTE — Progress Notes (Signed)
 0 ultrafiltration, treatment ended early as per Md Singh's order, post hemodialysis condition atrial fibrillation with rvr 90 to 110 bpm, blood pressure within normal range, and report was given to the RN assuming care.

## 2024-04-07 NOTE — Progress Notes (Signed)
 " Progress Note   Patient: Billy [name] FMW:[id number] DOB: 1950-08-28 DOA: 01/21/2024     77 DOS: the patient was seen and examined on 04/07/2024     73 year old male Prolonged admission 3/10-07/20/18/2025 cardiac arrest requiring ICU management cardioverted for A-fib-found to have DVTs then had hematochezia necessitating IVC filter-recurrent GI bleed secondary to erosive gastropathy and friable duodenal mucosa and colonoscopy was deferred and then chest tube placements cardiac cath at that time showed CAD with CTO of RCA and was treated with diuretics and sent home on torsemide  Entresto  Jardiance -   Readmitted 10/5 with nausea vomiting sudden tearing sensation found to have an esophageal tear was given K-centra--see below plans   Chronology  10/7 esophageal stent placed by cardiothoracic 10/15 stent reposition 10/16 extubated 10/20 reintubated with ultrasound showing left-sided pleural effusion-left-sided chest tube placed-by lights criteria exudative effusion 10/23 echo 60-65% 10/24 barium swallow showed persistent leak 10/28 transferred out of ICU 11/3 PEG tube placed 11/19 swelling of right arm showing on ultrasound SVT cephalic vein 11/22 chest x-ray?  Pneumonia done because of back pain-follow-up CT scan shows stent in place no mediastinal fluid collection small bilateral pleural effusions with bibasilar atelectasis pneumonia not excluded-scattered clusters nodular densities predominantly right upper and right lower lobes with nodule 11 mm as previously seen?  Mets?  Infection?  Aspiration 11/26 stent removed. Esophagram negative for esophageal leak.  11/27 getting diuresis again 11/28 duodenitis/enteritis.  GI consulted. Bloody emesis added carafate   11/29 trialing clears 12/1 NV again w/ hematemesis. Lasix  held ---  esophagram performed showing patent esophagus without perforation irregularity mid esophagus?  Stent diffuse narrowing lumen distal duodenum and proximal jejunum 12/2 EGD  ischemic ulcers. Impression:  - Congested, inflamed, nodular, texture changed  mucosa in the esophagus. - Gastritis, Non-bleeding duodenal ulcers with a clean ulcer  base (Forrest Class III).- Non-bleeding jejunal ulcers with a clean ulcer   base (Forrest Class III)  - Findings suggestive of ischemic ulcers. Increased carafate  and cont BID PPI  12/3 full liq diet  12/4 marked increased WOB. Acute and progressive w/ increased hypoxia and HTN. PCCM asked to see  12/10 again increased WOB, stubborn to diuresis, unable to oxygenate, to ICU--- azotemia and renal consulted 12/11 Intubated  12/12 extubated 12/13 c/o of epigastric pain but suspect more anxiety component, on cleviprex , CRRT for volume removal 12/15 pain improved, on RA, on clev and CRRT, renal bx pending 12/16 renal biopsy performed showing 12/17 off clev transition to iHD, transfer to floor  12/18: TDC placed 12/19 [name] assumed care, has distended and painful abdomen today, KUB shows improvement so tube feeds resumed, Eliquis  resumed  PLAN   Boerhaave syndrome,--- events as above-- moderate to severe abdominal pain Ischemic ulcer disease--GI and cardiothoracic aware-several CTs since 11/25-no change in management at this time Stop oral feeds-continue only PEG feeds-cannot get Carafate  with HD--- he has moderate to severe gastritis and when he has been graduated to a regular diet before he has experienced this---CT scan done today because of pain does not show clear etiology Last esophagram as above 12/1 Will reengage GI if persistence of issues given negative CT if this persists Pantoprazole  40 IV twice daily, Pepcid  increased to 40 twice daily -would watch closely and hold feeds for the time being  Cardiac arrest 06/2023 with HF MR EF 55-65% 03/27/2024 A-fib RVR CHADVASC >4 on Eliquis  [cardioverted 06/2023] Initial thought was to hold Eliquis  given drop in hemoglobin over the past several days however he has maintained his  hemoglobin  in the 7.8 range RVR occurred at HD 12/21 cardiology consulted-no change to plan Amio 200 twice daily now I added a low-dose of Coreg  3.125 twice daily monitor trends, check a.m. 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 IHD with [facility] 12/18 placed Not a candidate for immunomodulators-continues on prednisone  taper as per nephrology ending 04/28/2024 Not sure if he needs to be clip For blood pressure remains on minoxidil  10 as well as Coreg  3.125  Insomnia May continue trazodone  50-start cutting back Seroquel  from 25 twice daily, would use hydroxyzine  25 3 times daily as needed anxiety Diabetes CBG 147 200-continue Lantus  10U for coverage for now  Multiple bedsores and wounds anterior right and left foot and injury stage I to bottom Continue Gerhardt twice daily Lidoderm  patch for back pain Schedule Lomotil  1 tablet twice daily, can use as needed Lomotil  4 times daily in addition  Resolved/stable problems Petechial rash Hydralazine  and Unasyn  have been stopped, cannot rule out HSP--looks ok to me  Severe Malnutrition S/p PEG--ongoign as above  Acute hypoxic respiratory failure//Aspiration Pneumonia//Pleural effusion//Acute on chronic diastolic CHF exacerbation ?respiratory failure due to pneumonia, pleural effusion, CHF exacerbation -- Better c Lasix  and antibiotics.  Not currently on diuretics.  Bilateral UE swelling--duplex upper extremities ruled out DVT   Lung nodules-repeat CT in Jan/Feb window     subj    flipped into A-fib RVR at dialysis  and after being given Coreg  he seems to have settled down into a better controlled rhythm-cardiology has seen the patient He had some abdominal pain overnight as noted by the night coverage notes which is a little bit better after we have kept him n.p.o. and he is resting comfortably this evening at the bedside after I reviewed him His abdominal pain is centered in the epigastrium to some degree with no real radiation although he is a little distended   Physical Exam: BP 139/62 (BP Location: Left Arm)   Pulse 99   Temp 98.2 F (36.8 C) (Oral)   Resp 19   Ht 6' 1 (1.854 m)   Wt 77.5 kg   SpO2 95%   BMI 22.54 kg/m   Chroncially ill apppearing, lying in bed, sleeping to some degree Regular rate rhythm Chest is clear no wheeze no rales no rhonchi ROM is intact Power is 5/5   Data Reviewed: Sodium 134 potassium 4.7 BUN/creatinine 96/3.6 WBC 18.2 hemoglobin 7.8 platelet 226  Family Communication:  Non today    Disposition: Status is: Inpatient Suspect LTAC candidate  Author: Colen Grimes, MD 04/07/2024 6:29 PM  For on call review [email or web address]    "

## 2024-04-07 NOTE — Progress Notes (Signed)
 [name] night cross cover note:   I was notified by the patient's RN that the patient has exhibited 2 recent runs of nonsustained V. tach, with longer run associated with 7 beats, with ensuing spontaneous conversion back to sinus rhythm.  Patient asleep at that time of these runs of nonsustained V. tach.  Vital signs at this time notable for afebrile; heart rates in the 80s to 90s; most recent systolic blood pressures in the 160s; O2 sats in the mid 90s on room air.  Patient asleep at the time.   I've ordered STAT EKG at this time.     Eva Pore, DO Hospitalist

## 2024-04-07 NOTE — Progress Notes (Addendum)
 [facility] Progress Note   Assessment/ Plan:   AKI on CKD 3b             - HTN, backdrop of pretty bad L-sided RAS (occlusive)--> so essentially he has 1 functioning kidney and is more susceptible to volume status/ nephrotoxic agents             - off Losartan  and torsemide   -s/p CRRT 12/10-12/16  - ANA +, C4 low, + hematuria, anti-histone Ab +. 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  He has completed rounds of antibiotics.  Mild interstitial fibrosis/tubular atrophy. This biopsy does raise a concern for IgA Vasculitis/HSP given his clinical scenario however I don't believe adding MMF/Rituxan would be a good idea at this junction in the context of his MRSA pneumonia which he just finished abx for and esophageal perforation---risks>benefits. Also there is limited data with alternative therapies other than steroids. Overall, I do suspect this is more infection related given exudative appearance - on pred taper now -no evidence of renal recovery to date  -s/p [facility] 12/18. HD today, then on Tues, then on Friday (holiday schedule)--will keep on MWF sched moving  forward  Abdominal pain -open for CT with contrast which is okay with me (benefits>risks)   Petechial rash;             - appears like a drug rash but can't rule out HSP             - stopped hydralazine  & unasyn   -improving   Acute on chronic CHF exacerbation;             - stable vol status today, UF as tolerated with HD   H/o DVT and PE:             - had RH strain previously             - on eliquis    Boorehave's syndrome             - s/p repair             - Has a PEG  - EGD 03/19/24 with ulcers  - PPI and pepcid    HTN  -will UF as tolerated with HD  Dispo: inpt  FYI -- Holiday Schedule for next week Usual MWF schedule -> Sun (21),  Tues (23), Friday (26)  Discussed with primary service  Addendum: see HD note. 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 Revisit for HD needs tomorrow vs wait until Tues.   Subjective:    Patient seen and examined. Again with abd pain-severe   Objective:   BP (!) 178/69   Pulse 90   Temp 98.3 F (36.8 C)   Resp 19   Ht 6' 1 (1.854 m)   Wt 77.5 kg   SpO2 90%   BMI 22.54 kg/m   Intake/Output Summary (Last 24 hours) at 04/07/2024 9047 Last data filed at 04/07/2024 0200 Gross per 24 hour  Intake --  Output 375 ml  Net -375 ml   Weight change:   Physical Exam: GEN: awake, ill appearing/in pain NECK: no JVD PULM: cta bl, unlabored, normal wob CV: RRR ABD: + PEG, tender and distended EXT: trace edema b/l LEs SKIN: petechial nonblanching rash thighs, palms, arms---improving NEURO: awake, alert, following commands Dialysis access: RIJ temp HD catheter  Imaging: DG  Abd 1 View Result Date: 04/05/2024 CLINICAL DATA:  Abdominal distension.  Pain, nausea. EXAM: ABDOMEN - 1 VIEW COMPARISON:  Radiograph 04/02/2024 FINDINGS: Gastrostomy tube projects over the left upper quadrant. IVC filter in place. Persistent but improving gaseous bowel distension, remaining gaseous distention is primarily colon. No evidence of free air. Bilateral pleural effusions and bibasilar airspace disease, similar to prior. IMPRESSION: 1. Persistent but improving gaseous bowel distension, remaining gaseous distention is primarily colon. 2. Bilateral pleural effusions and bibasilar airspace disease, similar to prior. Electronically Signed   By: Andrea Gasman M.D.   On: 04/05/2024 21:12     Labs: BMET Recent Labs  Lab 04/01/24 1527 04/02/24 0425 04/03/24 0517 04/04/24 0410 04/05/24 0415 04/06/24 0900 04/07/24 0252  NA 134* 130* 133* 132* 134* 133* 134*  K 4.8 4.4 4.9 5.0 5.3* 4.4 4.7  CL 103 97* 98 98 99 96* 96*  CO2 25 25 26 22  21* 28 26  GLUCOSE 161* 213* 153* 123* 162* 147* 147*   BUN 29* 26* 53* 80* 101* 69* 96*  CREATININE 1.29* 1.13 2.57* 3.63* 4.33* 2.92* 3.64*  CALCIUM  7.3* 7.2* 7.9* 7.7* 8.3* 8.0* 8.1*  PHOS 2.8 2.5 3.6 4.3 4.9* 3.7 4.4   CBC Recent Labs  Lab 04/04/24 0410 04/05/24 0415 04/05/24 0921 04/06/24 0900 04/07/24 0252  WBC 22.1* 26.8*  --  16.3* 18.2*  HGB 8.4* 8.7* 8.9* 7.1* 7.8*  HCT 26.0* 26.6* 26.7* 22.4* 23.8*  MCV 83.6 83.1  --  82.1 81.5  PLT 240 256  --  203 226    Medications:     amiodarone   200 mg Per Tube BID   apixaban   5 mg Oral BID   atorvastatin   80 mg Per Tube Daily   Chlorhexidine  Gluconate Cloth  6 each Topical Daily   clonazePAM   0.5 mg Per Tube BID   ezetimibe   10 mg Per Tube Daily   feeding supplement (PROSource TF20)  60 mL Per Tube BID   fiber  1 packet Per Tube BID   [name] butt cream   Topical BID   guaiFENesin   10 mL Per Tube Q4H   insulin  aspart  0-9 Units Subcutaneous Q4H   insulin  glargine  10 Units Subcutaneous Daily   lidocaine   1 patch Transdermal Q24H   minoxidil   10 mg Per Tube Daily   multivitamin  1 tablet Per Tube QHS   pantoprazole  (PROTONIX ) IV  40 mg Intravenous Q12H   predniSONE   40 mg Oral Q breakfast   Followed by   NOREEN ON 04/14/2024] predniSONE   20 mg Oral Q breakfast   Followed by   NOREEN ON 04/21/2024] predniSONE   10 mg Oral Q breakfast   QUEtiapine   25 mg Per Tube BID   saccharomyces boulardii  250 mg Per Tube BID   sodium chloride  flush  10-40 mL Intracatheter Q12H    Ephriam Stank, MD [facility] 04/07/2024, 9:52 AM

## 2024-04-07 NOTE — Plan of Care (Signed)
" °  Problem: Education: Goal: Knowledge of General Education information will improve Description: Including pain rating scale, medication(s)/side effects and non-pharmacologic comfort measures Outcome: Progressing   Problem: Health Behavior/Discharge Planning: Goal: Ability to manage health-related needs will improve Outcome: Progressing   Problem: Clinical Measurements: Goal: Will remain free from infection Outcome: Progressing Goal: Diagnostic test results will improve Outcome: Progressing Goal: Respiratory complications will improve Outcome: Progressing Goal: Cardiovascular complication will be avoided Outcome: Progressing   Problem: Activity: Goal: Risk for activity intolerance will decrease Outcome: Progressing   Problem: Nutrition: Goal: Adequate nutrition will be maintained Outcome: Progressing   Problem: Coping: Goal: Level of anxiety will decrease Outcome: Progressing   Problem: Elimination: Goal: Will not experience complications related to bowel motility Outcome: Progressing Goal: Will not experience complications related to urinary retention Outcome: Progressing   Problem: Pain Managment: Goal: General experience of comfort will improve and/or be controlled Outcome: Progressing   Problem: Safety: Goal: Ability to remain free from injury will improve Outcome: Progressing   Problem: Skin Integrity: Goal: Risk for impaired skin integrity will decrease Outcome: Progressing   Problem: Education: Goal: Ability to describe self-care measures that may prevent or decrease complications (Diabetes Survival Skills Education) will improve Outcome: Progressing   Problem: Coping: Goal: Ability to adjust to condition or change in health will improve Outcome: Progressing   Problem: Fluid Volume: Goal: Ability to maintain a balanced intake and output will improve Outcome: Progressing   Problem: Health Behavior/Discharge Planning: Goal: Ability to identify and  utilize available resources and services will improve Outcome: Progressing Goal: Ability to manage health-related needs will improve Outcome: Progressing   Problem: Metabolic: Goal: Ability to maintain appropriate glucose levels will improve Outcome: Progressing   Problem: Nutritional: Goal: Maintenance of adequate nutrition will improve Outcome: Progressing Goal: Progress toward achieving an optimal weight will improve Outcome: Progressing   Problem: Skin Integrity: Goal: Risk for impaired skin integrity will decrease Outcome: Progressing   Problem: Tissue Perfusion: Goal: Adequacy of tissue perfusion will improve Outcome: Progressing   Problem: Education: Goal: Ability to describe self-care measures that may prevent or decrease complications (Diabetes Survival Skills Education) will improve Outcome: Progressing Goal: Individualized Educational Video(s) Outcome: Progressing   Problem: Coping: Goal: Ability to adjust to condition or change in health will improve Outcome: Progressing   Problem: Fluid Volume: Goal: Ability to maintain a balanced intake and output will improve Outcome: Progressing   Problem: Health Behavior/Discharge Planning: Goal: Ability to identify and utilize available resources and services will improve Outcome: Progressing Goal: Ability to manage health-related needs will improve Outcome: Progressing   Problem: Metabolic: Goal: Ability to maintain appropriate glucose levels will improve Outcome: Progressing   Problem: Nutritional: Goal: Maintenance of adequate nutrition will improve Outcome: Progressing Goal: Progress toward achieving an optimal weight will improve Outcome: Progressing   Problem: Skin Integrity: Goal: Risk for impaired skin integrity will decrease Outcome: Progressing   Problem: Tissue Perfusion: Goal: Adequacy of tissue perfusion will improve Outcome: Progressing   "

## 2024-04-08 DIAGNOSIS — K223 Perforation of esophagus: Secondary | ICD-10-CM | POA: Diagnosis not present

## 2024-04-08 LAB — GLUCOSE, CAPILLARY
Glucose-Capillary: 116 mg/dL — ABNORMAL HIGH (ref 70–99)
Glucose-Capillary: 116 mg/dL — ABNORMAL HIGH (ref 70–99)
Glucose-Capillary: 121 mg/dL — ABNORMAL HIGH (ref 70–99)
Glucose-Capillary: 136 mg/dL — ABNORMAL HIGH (ref 70–99)
Glucose-Capillary: 142 mg/dL — ABNORMAL HIGH (ref 70–99)
Glucose-Capillary: 146 mg/dL — ABNORMAL HIGH (ref 70–99)
Glucose-Capillary: 160 mg/dL — ABNORMAL HIGH (ref 70–99)

## 2024-04-08 LAB — RENAL FUNCTION PANEL
Albumin: 2.3 g/dL — ABNORMAL LOW (ref 3.5–5.0)
Anion gap: 11 (ref 5–15)
BUN: 89 mg/dL — ABNORMAL HIGH (ref 8–23)
CO2: 26 mmol/L (ref 22–32)
Calcium: 8 mg/dL — ABNORMAL LOW (ref 8.9–10.3)
Chloride: 94 mmol/L — ABNORMAL LOW (ref 98–111)
Creatinine, Ser: 3.26 mg/dL — ABNORMAL HIGH (ref 0.61–1.24)
GFR, Estimated: 19 mL/min — ABNORMAL LOW
Glucose, Bld: 131 mg/dL — ABNORMAL HIGH (ref 70–99)
Phosphorus: 3.7 mg/dL (ref 2.5–4.6)
Potassium: 4.5 mmol/L (ref 3.5–5.1)
Sodium: 132 mmol/L — ABNORMAL LOW (ref 135–145)

## 2024-04-08 LAB — CBC
HCT: 21.8 % — ABNORMAL LOW (ref 39.0–52.0)
Hemoglobin: 7.1 g/dL — ABNORMAL LOW (ref 13.0–17.0)
MCH: 27 pg (ref 26.0–34.0)
MCHC: 32.6 g/dL (ref 30.0–36.0)
MCV: 82.9 fL (ref 80.0–100.0)
Platelets: 197 K/uL (ref 150–400)
RBC: 2.63 MIL/uL — ABNORMAL LOW (ref 4.22–5.81)
RDW: 17 % — ABNORMAL HIGH (ref 11.5–15.5)
WBC: 16.1 K/uL — ABNORMAL HIGH (ref 4.0–10.5)
nRBC: 0 % (ref 0.0–0.2)

## 2024-04-08 LAB — MAGNESIUM: Magnesium: 2.3 mg/dL (ref 1.7–2.4)

## 2024-04-08 LAB — PREPARE RBC (CROSSMATCH)

## 2024-04-08 LAB — OCCULT BLOOD X 1 CARD TO LAB, STOOL: Fecal Occult Bld: NEGATIVE

## 2024-04-08 MED ORDER — DIPHENHYDRAMINE HCL 25 MG PO CAPS
25.0000 mg | ORAL_CAPSULE | Freq: Once | ORAL | Status: AC
  Administered 2024-04-08: 25 mg via ORAL
  Filled 2024-04-08: qty 1

## 2024-04-08 MED ORDER — FAMOTIDINE 20 MG PO TABS
20.0000 mg | ORAL_TABLET | Freq: Every day | ORAL | Status: DC
  Administered 2024-04-09 – 2024-04-27 (×17): 20 mg
  Filled 2024-04-08 (×12): qty 1

## 2024-04-08 MED ORDER — SODIUM CHLORIDE 0.9% IV SOLUTION: Freq: Once | INTRAVENOUS | Status: AC

## 2024-04-08 MED ORDER — AMLODIPINE BESYLATE 5 MG PO TABS
5.0000 mg | ORAL_TABLET | Freq: Every day | ORAL | Status: DC
  Administered 2024-04-08: 5 mg via ORAL
  Filled 2024-04-08: qty 1

## 2024-04-08 MED ORDER — ACETAMINOPHEN 325 MG PO TABS
650.0000 mg | ORAL_TABLET | Freq: Once | ORAL | Status: AC
  Administered 2024-04-08: 650 mg via ORAL
  Filled 2024-04-08: qty 2

## 2024-04-08 NOTE — Progress Notes (Signed)
 Orthopedic Tech Progress Note Patient Details:  Billy [name] 08-18-1950 [id number]  For some reason MD came and took off a fresh pair of UNNA BOOTS, RN reached out. Some point ill try to make it back up there  Patient ID: Billy [name], male   DOB: 11-27-1950, 73 y.o.   MRN: [id number]  [name] Pac 04/08/2024, 12:51 PM

## 2024-04-08 NOTE — Progress Notes (Addendum)
 [name] Billy [name] [id number] Admit--01/21/2024  1:34 PM    Code Status: Full Code   73 year old male Prolonged admission 3/10-07/20/18/2025 cardiac arrest requiring ICU management cardioverted for A-fib-found to have DVTs then had hematochezia necessitating IVC filter-recurrent GI bleed secondary to erosive gastropathy and friable duodenal mucosa and colonoscopy was deferred and then chest tube placements cardiac cath at that time showed CAD with CTO of RCA and was treated with diuretics and sent home on torsemide  Entresto  Jardiance -   Readmitted 10/5 with nausea vomiting sudden tearing sensation found to have an esophageal tear was given K-centra--see below plans   Chronology  10/7 esophageal stent placed by cardiothoracic 10/15 stent reposition 10/16 extubated 10/20 reintubated with ultrasound showing left-sided pleural effusion-left-sided chest tube placed-by lights criteria exudative effusion 10/23 echo 60-65% 10/24 barium swallow showed persistent leak 10/28 transferred out of ICU 11/3 PEG tube placed 11/19 swelling of right arm showing on ultrasound SVT cephalic vein 11/22 chest x-ray?  Pneumonia done because of back pain-follow-up CT scan shows stent in place no mediastinal fluid collection small bilateral pleural effusions with bibasilar atelectasis pneumonia not excluded-scattered clusters nodular densities predominantly right upper and right lower lobes with nodule 11 mm as previously seen?  Mets?  Infection?  Aspiration 11/26 stent removed. Esophagram negative for esophageal leak.  11/27 getting diuresis again 11/28 duodenitis/enteritis.  GI consulted. Bloody emesis added carafate   11/29 trialing clears 12/1 NV again w/ hematemesis. Lasix  held ---  esophagram performed showing patent esophagus without perforation irregularity mid esophagus?  Stent diffuse narrowing lumen distal duodenum and proximal jejunum 12/2 EGD ischemic ulcers. Impression:  - Congested, inflamed, nodular, texture  changed  mucosa in the esophagus. - Gastritis, Non-bleeding duodenal ulcers with a clean ulcer  base (Forrest Class III).- Non-bleeding jejunal ulcers with a clean ulcer   base (Forrest Class III)  - Findings suggestive of ischemic ulcers. Increased carafate  and cont BID PPI  12/3 full liq diet  12/4 marked increased WOB. Acute and progressive w/ increased hypoxia and HTN. PCCM asked to see  12/10 again increased WOB, stubborn to diuresis, unable to oxygenate, to ICU--- azotemia and renal consulted 12/11 Intubated  12/12 extubated 12/13 c/o of epigastric pain but suspect more anxiety component, on cleviprex , CRRT for volume removal 12/15 pain improved, on RA, on clev and CRRT, renal bx pending 12/16 renal biopsy performed showing 12/17 off clev transition to iHD, transfer to floor  12/18: TDC placed 12/19 [name] assumed care, has distended and painful abdomen today, KUB shows improvement so tube feeds resumed, Eliquis  resumed 12/22 hemoccult neg---transfuse 1 U  PLAN   Boerhaave syndrome,--- events as above-- moderate to severe abdominal pain Ischemic ulcer disease--GI and cardiothoracic aware-several CTs since 11/25---CT scan done 12/21 unremarkable-- Slow gentle soft diet today---his tolerance of diet is limited by pain form eating with prior severe Gastritis-continue PEG feeds-NO Carafate  with HD--- if on soft diet still does have pain, Will reengage GI  Last esophagram as above 12/1 Pantoprazole  40 IV twice daily, Pepcid  back to 20 every day given renal funct  Cardiac arrest 06/2023 with HF MR EF 55-65% 03/27/2024 A-fib RVR CHADVASC >4 on Eliquis  [cardioverted 06/2023] Initial thought was to hold Eliquis  given drop in hemoglobin over the past several days however he has maintained his hemoglobin in the 7.8 range RVR occurred at HD 12/21  cardiology consulted-no change to plan Amio 200 twice daily --started Coreg  6.25 twice daily  Entresto  and torsemide  for ever held given current  issues  Normocytic anemia secondary  to peptic ulcer disease Transfused x 1 in October another 03/29/2024 Trend CBC --Hemoccult 12/22 is neg Giving 1 more U PRBC  Iron  studies tomorrow  Severe Malnutrition S/p PEG--ongoing as above  DVT PE March 2025 Remains anticoagulated and does have IVC filter in place since March  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 IHD with [facility] 12/18 placed Not a candidate for immunomodulators-continues on prednisone  taper as per nephrology ending 04/28/2024 Not sure if he needs to be clip For blood pressure remains on minoxidil  10 as well as Coreg  3.125  Insomnia May continue trazodone  50-start cutting back Seroquel  from 25 twice daily, would use hydroxyzine  25 3 times daily as needed anxiety Diabetes CBG 147 200-continue Lantus  10U for coverage for now  Multiple bedsores and wounds anterior right and left foot and injury stage I to bottom Continue Gerhardt twice daily Lidoderm  patch for back pain---Bed sore last examined 12/22 and looks goof Picture of LLE and foot    Contiue UNNAboots--WOC to see Schedule Lomotil  1 tablet twice daily, can use as needed Lomotil  4 times daily in addition  Resolved/stable problems Petechial rash Hydralazine  and Unasyn  have been stopped, cannot rule out HSP--looks ok to me Acute hypoxic respiratory failure//Aspiration Pneumonia//Pleural effusion//Acute on chronic diastolic CHF exacerbation ?respiratory failure due to pneumonia, pleural effusion, CHF exacerbation -- Better c Lasix  and antibiotics.  Not currently on diuretics.  Bilateral UE swelling--duplex upper extremities ruled out DVT Lung nodules-repeat CT in Jan/Feb window     subj   Seen after HD Tachy--didn't get Coreg  due to HD No CP Pain in fingers ok  About to eat   Physical Exam: BP (!) 170/73 (BP Location: Left Arm)   Pulse 98   Temp 97.9 F (36.6 C) (Oral)   Resp 19   Ht 6' 1 (1.854 m)   Wt  78.7 kg   SpO2 98%   BMI 22.89 kg/m   Chroncially ill apppearing, lying in bed, sleeping to some degree Regular rate rhythm Chest is clear no wheeze no rales no rhonchi Right arm is chronically larger than the left ROM is intact Power is 5/5   Data Reviewed: Sodium 132 potassium 4.5 BUN/creatinine 89/3.2 LFTs normal WBC 16.1 hemoglobin 7.1 platelet 197  Billy Grimes, MD 04/08/2024 3:53 PM  For on call review [email or web address].

## 2024-04-08 NOTE — Progress Notes (Signed)
 Orthopedic Tech Progress Note Patient Details:  Billy [name] 1950-12-30 [id number]  Ortho Devices Type of Ortho Device: Ace wrap, Unna boot Ortho Device/Splint Location: BLE Ortho Device/Splint Interventions: Removal, Ordered, Application, Adjustmentpatient stated whoever has been applying the UNNA BOOTS are making them too tight on his legs. Told him ill let others know    Post Interventions Patient Tolerated: Well Instructions Provided: Care of device  [name] Pac 04/08/2024, 11:26 AM

## 2024-04-08 NOTE — Progress Notes (Signed)
 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  He has completed rounds of antibiotics.  Mild interstitial fibrosis/tubular atrophy.  - Given concern for infection we provided steroids but avoided other IS medications due to weighing risks/benefits - on pred taper now with plans to wean quickly given the main concern is infection as a cause -he is on minoxidil  which caries similar risk for AI kidney phenomena as hydralazine . I will stop this and start amlodipine  (he had swelling previously so will monitor) -good signs of renal recovery; good UOP and Crt fairly stable -did receive contrast on 12/21 which may hinder Crt improvement -no need for HD today, possibly tomorrow 2nd shift but wait for labs to return in AM to eval for revovery    Abdominal pain -CT without anything glaring. Defer to Primary   Petechial rash;             - 2/2 Iga. Mgmt as above   Acute on chronic CHF exacerbation;             - stable vol status today, UF as tolerated with HD   H/o DVT and PE:             - had RH strain previously             - on eliquis    Boorehave's syndrome             - s/p repair             - Has a PEG  - EGD 03/19/24 with ulcers  - PPI and pepcid    HTN  -stop minoxidil  as above. On BB. Start amlodipine  (edema in the past). Could consider ARB as Crt improves  Dispo: inpt  FYI -- Holiday Schedule for next week Usual MWF schedule -> Sun (21), Tues (23), Friday (26)     Subjective:    Patient had some ectopy on dialysis yesterday had to stop early.  Good urine output over the past 24 hours.  Overall  feels okay   Objective:   BP (!) 170/73 (BP Location: Left Arm)   Pulse 98   Temp 97.9 F (36.6 C) (Oral)   Resp 19   Ht 6' 1 (1.854 m)   Wt 78.7 kg   SpO2 98%   BMI 22.89 kg/m   Intake/Output Summary (Last 24 hours) at 04/08/2024 0950 Last data filed at 04/08/2024 9474 Gross per 24 hour  Intake --  Output 460 ml  Net -460 ml   Weight change:   Physical Exam: GEN: Lying in bed, no distress PULM: Bilateral chest rise with no increased work of breathing CV: Normal rate but borderline tachycardic, no rub ABD: + PEG, mild distention EXT: trace edema b/l LEs SKIN: petechial nonblanching rash thighs, palms, arms NEURO: awake, alert, interactive Dialysis access: RIJ temp HD catheter  Imaging: CT ABDOMEN PELVIS W CONTRAST Result Date: 04/07/2024 CLINICAL DATA:  Peritonitis or perforation suspected. EXAM: CT ABDOMEN AND PELVIS WITH CONTRAST TECHNIQUE: Multidetector CT imaging of the abdomen and pelvis was performed using the standard protocol following  bolus administration of intravenous contrast. RADIATION DOSE REDUCTION: This exam was performed according to the departmental dose-optimization program which includes automated exposure control, adjustment of the mA and/or kV according to patient size and/or use of iterative reconstruction technique. CONTRAST:  75mL OMNIPAQUE  IOHEXOL  350 MG/ML SOLN COMPARISON:  CT abdomen pelvis dated 03/24/2024. FINDINGS: Lower chest: Partially visualized small bilateral pleural effusions with partial compressive atelectasis of the lower lobes. Mild cardiomegaly. No intra-abdominal free air.  Small ascites. Hepatobiliary: The liver is unremarkable. There is mild biliary ductal dilatation versus mild periportal edema. Tiny layering stone noted in the gallbladder neck. No pericholecystic fluid or evidence of acute cholecystitis by CT. Pancreas: Unremarkable. No pancreatic ductal dilatation or surrounding inflammatory changes. Spleen: Normal in size without  focal abnormality. Adrenals/Urinary Tract: The adrenal glands unremarkable. The left kidney is atrophic. The right kidney is unremarkable. The visualized ureters appear unremarkable. The urinary bladder is decompressed around a Foley catheter. 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 There is moderate stool throughout the colon. There is no bowel obstruction or active inflammation. The appendix is normal. Vascular/Lymphatic: Moderate aortoiliac atherosclerotic disease. The IVC is unremarkable. An infrarenal IVC filter is noted. No portal venous gas. The SMV, splenic vein, and main portal vein are patent. There is no adenopathy. Reproductive: The prostate gland is grossly unremarkable. Other: There is diffuse subcutaneous edema and anasarca. Musculoskeletal: No acute osseous pathology. Degenerative changes of the spine. IMPRESSION: 1. No bowel obstruction. Normal appendix. 2. Small ascites and anasarca. 3. Partially visualized small bilateral pleural effusions with partial compressive atelectasis of the lower lobes. 4. Cholelithiasis. 5.  Aortic Atherosclerosis (ICD10-I70.0). Electronically Signed   By: Vanetta Chou M.D.   On: 04/07/2024 16:14     Labs: BMET Recent Labs  Lab 04/02/24 0425 04/03/24 0517 04/04/24 0410 04/05/24 0415 04/06/24 0900 04/07/24 0252 04/07/24 2250 04/08/24 0444  NA 130* 133* 132* 134* 133* 134* 132* 132*  K 4.4 4.9 5.0 5.3* 4.4 4.7 4.8 4.5  CL 97* 98 98 99 96* 96* 95* 94*  CO2 25 26 22  21* 28 26 28 26   GLUCOSE 213* 153* 123* 162* 147* 147* 105* 131*  BUN 26* 53* 80* 101* 69* 96* 85* 89*  CREATININE 1.13 2.57* 3.63* 4.33* 2.92* 3.64* 3.20* 3.26*  CALCIUM  7.2* 7.9* 7.7* 8.3* 8.0* 8.1* 7.9* 8.0*  PHOS 2.5 3.6 4.3 4.9* 3.7 4.4  --  3.7   CBC Recent Labs  Lab 04/05/24 0415 04/05/24 0921 04/06/24 0900 04/07/24 0252 04/08/24 0444  WBC 26.8*  --  16.3* 18.2* 16.1*  HGB 8.7* 8.9* 7.1* 7.8* 7.1*  HCT 26.6* 26.7* 22.4* 23.8*  21.8*  MCV 83.1  --  82.1 81.5 82.9  PLT 256  --  203 226 197    Medications:     amiodarone   200 mg Per Tube BID   atorvastatin   80 mg Per Tube Daily   carvedilol   3.125 mg Per Tube BID WC   Chlorhexidine  Gluconate Cloth  6 each Topical Daily   clonazePAM   0.5 mg Per Tube BID   ezetimibe   10 mg Per Tube Daily   famotidine   40 mg Per Tube BID   feeding supplement (PROSource TF20)  60 mL Per Tube BID   fiber  1 packet Per Tube BID   [name] butt cream   Topical BID   guaiFENesin   10 mL Per Tube Q4H   insulin  aspart  0-9 Units Subcutaneous Q4H   insulin  glargine  10 Units  Subcutaneous Daily   lidocaine   1 patch Transdermal Q24H   minoxidil   10 mg Per Tube Daily   multivitamin  1 tablet Per Tube QHS   pantoprazole  (PROTONIX ) IV  40 mg Intravenous Q12H   predniSONE   40 mg Per Tube Q breakfast   Followed by   NOREEN ON 04/14/2024] predniSONE   20 mg Oral Q breakfast   Followed by   NOREEN ON 04/21/2024] predniSONE   10 mg Oral Q breakfast   QUEtiapine   25 mg Per Tube BID   saccharomyces boulardii  250 mg Per Tube BID   sodium chloride  flush  10-40 mL Intracatheter Q12H

## 2024-04-08 NOTE — Plan of Care (Signed)
  Problem: Education: Goal: Knowledge of General Education information will improve Description: Including pain rating scale, medication(s)/side effects and non-pharmacologic comfort measures Outcome: Progressing   Problem: Health Behavior/Discharge Planning: Goal: Ability to manage health-related needs will improve Outcome: Progressing   Problem: Clinical Measurements: Goal: Will remain free from infection Outcome: Progressing Goal: Diagnostic test results will improve Outcome: Progressing Goal: Respiratory complications will improve Outcome: Progressing Goal: Cardiovascular complication will be avoided Outcome: Progressing   Problem: Activity: Goal: Risk for activity intolerance will decrease Outcome: Progressing   Problem: Nutrition: Goal: Adequate nutrition will be maintained Outcome: Progressing   Problem: Coping: Goal: Level of anxiety will decrease Outcome: Progressing   Problem: Elimination: Goal: Will not experience complications related to bowel motility Outcome: Progressing Goal: Will not experience complications related to urinary retention Outcome: Progressing   Problem: Pain Managment: Goal: General experience of comfort will improve and/or be controlled Outcome: Progressing   Problem: Safety: Goal: Ability to remain free from injury will improve Outcome: Progressing   Problem: Skin Integrity: Goal: Risk for impaired skin integrity will decrease Outcome: Progressing   Problem: Education: Goal: Ability to describe self-care measures that may prevent or decrease complications (Diabetes Survival Skills Education) will improve Outcome: Progressing   Problem: Coping: Goal: Ability to adjust to condition or change in health will improve Outcome: Progressing   Problem: Fluid Volume: Goal: Ability to maintain a balanced intake and output will improve Outcome: Progressing   Problem: Health Behavior/Discharge Planning: Goal: Ability to identify and  utilize available resources and services will improve Outcome: Progressing Goal: Ability to manage health-related needs will improve Outcome: Progressing   Problem: Metabolic: Goal: Ability to maintain appropriate glucose levels will improve Outcome: Progressing   Problem: Nutritional: Goal: Maintenance of adequate nutrition will improve Outcome: Progressing Goal: Progress toward achieving an optimal weight will improve Outcome: Progressing   Problem: Skin Integrity: Goal: Risk for impaired skin integrity will decrease Outcome: Progressing   Problem: Tissue Perfusion: Goal: Adequacy of tissue perfusion will improve Outcome: Progressing   Problem: Activity: Goal: Ability to tolerate increased activity will improve Outcome: Progressing   Problem: Respiratory: Goal: Ability to maintain a clear airway and adequate ventilation will improve Outcome: Progressing   Problem: Role Relationship: Goal: Method of communication will improve Outcome: Progressing   Problem: Education: Goal: Ability to describe self-care measures that may prevent or decrease complications (Diabetes Survival Skills Education) will improve Outcome: Progressing Goal: Individualized Educational Video(s) Outcome: Progressing   Problem: Coping: Goal: Ability to adjust to condition or change in health will improve Outcome: Progressing   Problem: Fluid Volume: Goal: Ability to maintain a balanced intake and output will improve Outcome: Progressing   Problem: Health Behavior/Discharge Planning: Goal: Ability to identify and utilize available resources and services will improve Outcome: Progressing Goal: Ability to manage health-related needs will improve Outcome: Progressing   Problem: Metabolic: Goal: Ability to maintain appropriate glucose levels will improve Outcome: Progressing   Problem: Nutritional: Goal: Maintenance of adequate nutrition will improve Outcome: Progressing Goal: Progress  toward achieving an optimal weight will improve Outcome: Progressing   Problem: Skin Integrity: Goal: Risk for impaired skin integrity will decrease Outcome: Progressing   Problem: Tissue Perfusion: Goal: Adequacy of tissue perfusion will improve Outcome: Progressing

## 2024-04-08 NOTE — TOC Progression Note (Signed)
 Transition of Care [facility]) - Progression Note    Patient Details  Name: Billy [name] MRN: [id number] Date of Birth: 01/10/1951  Transition [facility]) CM/SW Contact  Lendia Dais, [place] Phone Number: 04/08/2024, 2:40 PM  Clinical Narrative: Pt is not yet medically stable for discharge. Pt's hemoglobin levels are not yet stable.   CSW will continue to monitor for medical progression.     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, SNF Pending bed offer               Expected Discharge Plan and Services In-house Referral: Clinical Social Work Discharge Planning Services: [employer] Consult Post Acute Care Choice: Skilled Nursing Facility Living arrangements for the past 2 months: Single Family Home                                       Social Drivers of Health (SDOH) Interventions SDOH Screenings   Food Insecurity: No Food Insecurity (01/22/2024)  Housing: High Risk (01/22/2024)  Transportation Needs: No Transportation Needs (01/22/2024)  Utilities: Not At Risk (01/22/2024)  Social Connections: Socially Isolated (01/22/2024)  Tobacco Use: Low Risk (03/19/2024)    Readmission Risk Interventions    06/27/2023    1:52 PM 10/14/2022   11:29 AM  Readmission Risk Prevention Plan  Post Dischage Appt  Complete  Medication Screening  Complete  Transportation Screening Complete Complete  HRI or Home Care Consult Complete   Social Work Consult for Recovery Care Planning/Counseling Complete   Palliative Care Screening Not Applicable   Medication Review Oceanographer) Referral to Pharmacy

## 2024-04-08 NOTE — Progress Notes (Signed)
 Physical Therapy Treatment Patient Details Name: Billy [name] MRN: [id number] DOB: 1950/07/01 Today's Date: 04/08/2024   History of Present Illness Billy [name] is a 73 y.o. male who presented to [facility] ED 01/21/24 with several days of vomiting followed by an episode of severe tearing epigastric pain. Work-up revealed esophageal tear. Pt s/p esophageal stent with EGD 10/7. Attempted stent repositioning on 10/15 and was hypoxic, was intubated.  Reintubated 10/20-10/24. G tube placed 11/3. He accidentally pulled out the R chest tube on 11/7.L chest tube removed 11/11. Found to have superficial venous thrombosis of RUE 11/19. N&V with epigastric pain 11/28, found severe duodenitis on repeat CT. S/P EGD on 12/2. 12/10 re-intubated due to respiratory distress. CRRT 12/10-12/16. Extubated 12/12. PMHx: HFrEF, AFib, CAD, PE complicated by cardiogenic shock, CKD 4, MI, peripheral neuropathy, and TIA.    PT Comments  Pt received in supine and agreeable to session. Pt noted to be incontinent of bowel and requires min A to roll and total A for pericare. Pt reports bottom pain that limits activity tolerance this session. Pt able to stand with mod A and pivot with mod A progressing to min A with improved posterior bias. Pt demonstrates limited standing tolerance and requires seated rest in recliner. Pt declines gait trial this session and requests to eat lunch. Pt continues to benefit from PT services to progress toward functional mobility goals.    If plan is discharge home, recommend the following: Assistance with cooking/housework;Direct supervision/assist for medications management;Assist for transportation;Help with stairs or ramp for entrance;Two people to help with walking and/or transfers;Two people to help with bathing/dressing/bathroom;Assistance with feeding;Direct supervision/assist for financial management   Can travel by private vehicle     Yes  Equipment Recommendations  Rolling walker (2  wheels);BSC/3in1;Wheelchair (measurements PT);Wheelchair cushion (measurements PT);Hospital bed    Recommendations for Other Services       Precautions / Restrictions Precautions Precautions: Fall Recall of Precautions/Restrictions: Impaired Precaution/Restrictions Comments: PEG Restrictions Weight Bearing Restrictions Per Provider Order: No     Mobility  Bed Mobility Overal bed mobility: Needs Assistance Bed Mobility: Rolling, Sidelying to Sit Rolling: Min assist Sidelying to sit: Mod assist, Used rails       General bed mobility comments: Cues for sequencing and pt requesting assist despite encouragement to perform what he could first. Assist to roll L/R and BLE management and trunk elevation to sit to EOB    Transfers Overall transfer level: Needs assistance Equipment used: Rolling walker (2 wheels) Transfers: Sit to/from Stand, Bed to chair/wheelchair/BSC Sit to Stand: Mod assist   Step pivot transfers: Min assist, Mod assist       General transfer comment: STS from EOB with mod A for rise and for balance due to posterior bias. Initially mod A to pivot to recliner due to posterior bias, but improving to min A with improved balance    Ambulation/Gait               General Gait Details: Pt declines due to pain   Stairs             Wheelchair Mobility     Tilt Bed    Modified Rankin (Stroke Patients Only)       Balance Overall balance assessment: Needs assistance Sitting-balance support: No upper extremity supported, Feet supported Sitting balance-Leahy Scale: Fair Sitting balance - Comments: Poor tolerance due to bottom pain, but no LOB   Standing balance support: Reliant on assistive device for balance, During functional activity, Bilateral upper extremity  supported Standing balance-Leahy Scale: Poor Standing balance comment: Reliant on RW                            Communication Communication Communication:  Impaired Factors Affecting Communication: Hearing impaired  Cognition Arousal: Alert Behavior During Therapy: Flat affect, Agitated   PT - Cognitive impairments: Attention, Sequencing, Problem solving                       PT - Cognition Comments: Slightly agitated due to pain and moves quickly requiring cues for safety and sequencing Following commands: Impaired Following commands impaired: Follows one step commands with increased time, Follows multi-step commands with increased time    Cueing Cueing Techniques: Verbal cues, Tactile cues  Exercises      General Comments        Pertinent Vitals/Pain Pain Assessment Pain Assessment: Faces Faces Pain Scale: Hurts even more Pain Location: bottom Pain Descriptors / Indicators: Discomfort, Guarding, Grimacing, Burning Pain Intervention(s): Limited activity within patient's tolerance, Monitored during session, Repositioned     PT Goals (current goals can now be found in the care plan section) Acute Rehab PT Goals Patient Stated Goal: to improve PT Goal Formulation: With patient Time For Goal Achievement: 04/12/24 Progress towards PT goals: Progressing toward goals    Frequency    Min 2X/week       AM-PAC PT 6 Clicks Mobility   Outcome Measure  Help needed turning from your back to your side while in a flat bed without using bedrails?: A Little Help needed moving from lying on your back to sitting on the side of a flat bed without using bedrails?: A Lot Help needed moving to and from a bed to a chair (including a wheelchair)?: A Lot Help needed standing up from a chair using your arms (e.g., wheelchair or bedside chair)?: A Lot Help needed to walk in hospital room?: A Lot Help needed climbing 3-5 steps with a railing? : Total 6 Click Score: 12    End of Session   Activity Tolerance: Patient limited by pain Patient left: in chair;with call bell/phone within reach;with chair alarm set Nurse Communication:  Mobility status PT Visit Diagnosis: Difficulty in walking, not elsewhere classified (R26.2);Unsteadiness on feet (R26.81);Other abnormalities of gait and mobility (R26.89);Muscle weakness (generalized) (M62.81);Other symptoms and signs involving the nervous system (R29.898);History of falling (Z91.81)     Time: 8568-8540 PT Time Calculation (min) (ACUTE ONLY): 28 min  Charges:    $Therapeutic Activity: 23-37 mins PT General Charges $$ ACUTE PT VISIT: 1 Visit                    Darryle George, PTA Acute Rehabilitation Services Secure Chat Preferred  [phone]    Darryle George 04/08/2024, 3:31 PM

## 2024-04-08 NOTE — Plan of Care (Signed)
   Problem: Education: Goal: Knowledge of General Education information will improve Description Including pain rating scale, medication(s)/side effects and non-pharmacologic comfort measures Outcome: Progressing

## 2024-04-09 ENCOUNTER — Inpatient Hospital Stay (HOSPITAL_COMMUNITY)

## 2024-04-09 DIAGNOSIS — K223 Perforation of esophagus: Secondary | ICD-10-CM | POA: Diagnosis not present

## 2024-04-09 LAB — CBC
HCT: 25.6 % — ABNORMAL LOW (ref 39.0–52.0)
Hemoglobin: 8.4 g/dL — ABNORMAL LOW (ref 13.0–17.0)
MCH: 27.6 pg (ref 26.0–34.0)
MCHC: 32.8 g/dL (ref 30.0–36.0)
MCV: 84.2 fL (ref 80.0–100.0)
Platelets: 195 K/uL (ref 150–400)
RBC: 3.04 MIL/uL — ABNORMAL LOW (ref 4.22–5.81)
RDW: 17.2 % — ABNORMAL HIGH (ref 11.5–15.5)
WBC: 29.5 K/uL — ABNORMAL HIGH (ref 4.0–10.5)
nRBC: 0 % (ref 0.0–0.2)

## 2024-04-09 LAB — BPAM RBC
Blood Product Expiration Date: 202512292359
ISSUE DATE / TIME: 202512222203
Unit Type and Rh: 600

## 2024-04-09 LAB — TYPE AND SCREEN
ABO/RH(D): A POS
Antibody Screen: NEGATIVE
Unit division: 0

## 2024-04-09 LAB — RENAL FUNCTION PANEL
Albumin: 2.4 g/dL — ABNORMAL LOW (ref 3.5–5.0)
Anion gap: 13 (ref 5–15)
BUN: 107 mg/dL — ABNORMAL HIGH (ref 8–23)
CO2: 24 mmol/L (ref 22–32)
Calcium: 8 mg/dL — ABNORMAL LOW (ref 8.9–10.3)
Chloride: 94 mmol/L — ABNORMAL LOW (ref 98–111)
Creatinine, Ser: 4.12 mg/dL — ABNORMAL HIGH (ref 0.61–1.24)
GFR, Estimated: 15 mL/min — ABNORMAL LOW
Glucose, Bld: 128 mg/dL — ABNORMAL HIGH (ref 70–99)
Phosphorus: 3.2 mg/dL (ref 2.5–4.6)
Potassium: 5.2 mmol/L — ABNORMAL HIGH (ref 3.5–5.1)
Sodium: 130 mmol/L — ABNORMAL LOW (ref 135–145)

## 2024-04-09 LAB — GLUCOSE, CAPILLARY
Glucose-Capillary: 153 mg/dL — ABNORMAL HIGH (ref 70–99)
Glucose-Capillary: 139 mg/dL — ABNORMAL HIGH (ref 70–99)
Glucose-Capillary: 155 mg/dL — ABNORMAL HIGH (ref 70–99)
Glucose-Capillary: 172 mg/dL — ABNORMAL HIGH (ref 70–99)
Glucose-Capillary: 211 mg/dL — ABNORMAL HIGH (ref 70–99)
Glucose-Capillary: 206 mg/dL — ABNORMAL HIGH (ref 70–99)

## 2024-04-09 LAB — IRON AND TIBC
Iron: 35 ug/dL — ABNORMAL LOW (ref 45–182)
Saturation Ratios: 18 % (ref 17.9–39.5)
TIBC: 193 ug/dL — ABNORMAL LOW (ref 250–450)
UIBC: 158 ug/dL

## 2024-04-09 LAB — LACTIC ACID, PLASMA: Lactic Acid, Venous: 2 mmol/L (ref 0.5–1.9)

## 2024-04-09 LAB — MAGNESIUM: Magnesium: 2.3 mg/dL (ref 1.7–2.4)

## 2024-04-09 MED ORDER — SODIUM CHLORIDE 0.9 % IV SOLN
3.0000 g | Freq: Every day | INTRAVENOUS | Status: DC
  Administered 2024-04-09 – 2024-04-11 (×3): 3 g via INTRAVENOUS
  Filled 2024-04-09 (×3): qty 8

## 2024-04-09 MED ORDER — HYDROXYZINE HCL 10 MG PO TABS: 10.0000 mg | ORAL_TABLET | Freq: Three times a day (TID) | ORAL | Status: DC | PRN

## 2024-04-09 MED ORDER — ALTEPLASE 2 MG IJ SOLR
2.0000 mg | Freq: Once | INTRAMUSCULAR | Status: AC
  Administered 2024-04-09: 2 mg
  Filled 2024-04-09: qty 2

## 2024-04-09 MED ORDER — TORSEMIDE 100 MG PO TABS
100.0000 mg | ORAL_TABLET | Freq: Every day | ORAL | Status: DC
  Administered 2024-04-10 – 2024-04-14 (×5): 100 mg via ORAL
  Filled 2024-04-09: qty 5
  Filled 2024-04-09: qty 1
  Filled 2024-04-09: qty 5
  Filled 2024-04-09 (×2): qty 1

## 2024-04-09 MED ORDER — FUROSEMIDE 10 MG/ML IJ SOLN
40.0000 mg | Freq: Once | INTRAMUSCULAR | Status: AC
  Administered 2024-04-09: 40 mg via INTRAVENOUS
  Filled 2024-04-09: qty 4

## 2024-04-09 MED ORDER — QUETIAPINE FUMARATE 25 MG PO TABS
12.5000 mg | ORAL_TABLET | Freq: Every day | ORAL | Status: DC
  Administered 2024-04-10 – 2024-04-19 (×10): 12.5 mg
  Filled 2024-04-09 (×5): qty 1

## 2024-04-09 MED ORDER — HYDROMORPHONE HCL 1 MG/ML IJ SOLN
INTRAMUSCULAR | Status: AC
  Filled 2024-04-09: qty 0.5

## 2024-04-09 NOTE — Progress Notes (Signed)
 Pt was recently treated for a course of MRSA PNA. Suspecting aspiration so Unasyn  ordered. He is on HD   Unasyn  3g IV q24  Sergio Batch, PharmD, [place], AAHIVP, CPP Infectious Disease Pharmacist 04/09/2024 12:41 PM

## 2024-04-09 NOTE — Progress Notes (Signed)
 PRBC x1 unit infused, no adverse effects noted.

## 2024-04-09 NOTE — Plan of Care (Signed)
  Problem: Education: Goal: Knowledge of General Education information will improve Description: Including pain rating scale, medication(s)/side effects and non-pharmacologic comfort measures Outcome: Progressing   Problem: Health Behavior/Discharge Planning: Goal: Ability to manage health-related needs will improve Outcome: Progressing   Problem: Clinical Measurements: Goal: Will remain free from infection Outcome: Progressing Goal: Diagnostic test results will improve Outcome: Progressing Goal: Respiratory complications will improve Outcome: Progressing Goal: Cardiovascular complication will be avoided Outcome: Progressing   Problem: Activity: Goal: Risk for activity intolerance will decrease Outcome: Progressing   Problem: Nutrition: Goal: Adequate nutrition will be maintained Outcome: Progressing   Problem: Coping: Goal: Level of anxiety will decrease Outcome: Progressing   Problem: Elimination: Goal: Will not experience complications related to bowel motility Outcome: Progressing Goal: Will not experience complications related to urinary retention Outcome: Progressing   Problem: Pain Managment: Goal: General experience of comfort will improve and/or be controlled Outcome: Progressing   Problem: Safety: Goal: Ability to remain free from injury will improve Outcome: Progressing   Problem: Skin Integrity: Goal: Risk for impaired skin integrity will decrease Outcome: Progressing   Problem: Education: Goal: Ability to describe self-care measures that may prevent or decrease complications (Diabetes Survival Skills Education) will improve Outcome: Progressing   Problem: Coping: Goal: Ability to adjust to condition or change in health will improve Outcome: Progressing   Problem: Fluid Volume: Goal: Ability to maintain a balanced intake and output will improve Outcome: Progressing   Problem: Health Behavior/Discharge Planning: Goal: Ability to identify and  utilize available resources and services will improve Outcome: Progressing Goal: Ability to manage health-related needs will improve Outcome: Progressing   Problem: Metabolic: Goal: Ability to maintain appropriate glucose levels will improve Outcome: Progressing   Problem: Nutritional: Goal: Maintenance of adequate nutrition will improve Outcome: Progressing Goal: Progress toward achieving an optimal weight will improve Outcome: Progressing   Problem: Skin Integrity: Goal: Risk for impaired skin integrity will decrease Outcome: Progressing   Problem: Tissue Perfusion: Goal: Adequacy of tissue perfusion will improve Outcome: Progressing   Problem: Activity: Goal: Ability to tolerate increased activity will improve Outcome: Progressing   Problem: Respiratory: Goal: Ability to maintain a clear airway and adequate ventilation will improve Outcome: Progressing   Problem: Role Relationship: Goal: Method of communication will improve Outcome: Progressing   Problem: Education: Goal: Ability to describe self-care measures that may prevent or decrease complications (Diabetes Survival Skills Education) will improve Outcome: Progressing Goal: Individualized Educational Video(s) Outcome: Progressing   Problem: Coping: Goal: Ability to adjust to condition or change in health will improve Outcome: Progressing   Problem: Fluid Volume: Goal: Ability to maintain a balanced intake and output will improve Outcome: Progressing   Problem: Health Behavior/Discharge Planning: Goal: Ability to identify and utilize available resources and services will improve Outcome: Progressing Goal: Ability to manage health-related needs will improve Outcome: Progressing   Problem: Metabolic: Goal: Ability to maintain appropriate glucose levels will improve Outcome: Progressing   Problem: Nutritional: Goal: Maintenance of adequate nutrition will improve Outcome: Progressing Goal: Progress  toward achieving an optimal weight will improve Outcome: Progressing   Problem: Skin Integrity: Goal: Risk for impaired skin integrity will decrease Outcome: Progressing   Problem: Tissue Perfusion: Goal: Adequacy of tissue perfusion will improve Outcome: Progressing

## 2024-04-09 NOTE — Progress Notes (Addendum)
 [name] Billy [name] [id number] Admit--01/21/2024  1:34 PM    Code Status: Full Code   73 year old male Prolonged admission 3/10-07/20/18/2025 cardiac arrest requiring ICU management cardioverted for A-fib-found to have DVTs then had hematochezia necessitating IVC filter-recurrent GI bleed secondary to erosive gastropathy and friable duodenal mucosa and colonoscopy was deferred and then chest tube placements cardiac cath at that time showed CAD with CTO of RCA and was treated with diuretics and sent home on torsemide  Entresto  Jardiance -   Readmitted 10/5 with nausea vomiting sudden tearing sensation found to have an esophageal tear was given K-centra--see below plans   Chronology  10/7 esophageal stent placed by cardiothoracic 10/15 stent reposition 10/16 extubated 10/20 reintubated with ultrasound showing left-sided pleural effusion-left-sided chest tube placed-by lights criteria exudative effusion 10/23 echo 60-65% 10/24 barium swallow showed persistent leak 10/28 transferred out of ICU 11/3 PEG tube placed 11/19 swelling of right arm showing on ultrasound SVT cephalic vein 11/22 chest x-ray?  Pneumonia done because of back pain-follow-up CT scan shows stent in place no mediastinal fluid collection small bilateral pleural effusions with bibasilar atelectasis pneumonia not excluded-scattered clusters nodular densities predominantly right upper and right lower lobes with nodule 11 mm as previously seen?  Mets?  Infection?  Aspiration 11/26 stent removed. Esophagram negative for esophageal leak.  11/27 getting diuresis again 11/28 duodenitis/enteritis.  GI consulted. Bloody emesis added carafate   11/29 trialing clears 12/1 NV again w/ hematemesis. Lasix  held ---  esophagram performed showing patent esophagus without perforation irregularity mid esophagus?  Stent diffuse narrowing lumen distal duodenum and proximal jejunum 12/2 EGD ischemic ulcers. Impression:  - Congested, inflamed, nodular, texture  changed  mucosa in the esophagus. - Gastritis, Non-bleeding duodenal ulcers with a clean ulcer  base (Forrest Class III).- Non-bleeding jejunal ulcers with a clean ulcer   base (Forrest Class III)  - Findings suggestive of ischemic ulcers. Increased carafate  and cont BID PPI  12/3 full liq diet  12/4 marked increased WOB. Acute and progressive w/ increased hypoxia and HTN. PCCM asked to see  12/10 again increased WOB, stubborn to diuresis, unable to oxygenate, to ICU--- azotemia and renal consulted 12/11 Intubated  12/12 extubated 12/13 c/o of epigastric pain but suspect more anxiety component, on cleviprex , CRRT for volume removal 12/15 pain improved, on RA, on clev and CRRT, renal bx pending 12/16 renal biopsy performed showing 12/17 off clev transition to iHD, transfer to floor  12/18: TDC placed 12/19 [name] assumed care, has distended and painful abdomen today, KUB shows improvement so tube feeds resumed, Eliquis  resumed 12/22 hemoccult neg---transfuse 1 U 12/23 vascular consulted secondary to lower extremities--- sleepy this morning-x-ray shows congestion no pneumonia white count up  PLAN   ?  Lower limb ischemia On my exam yesterday after removing Unna boot foot did not feel cold-today it seems cooler Bedside Doppler unclear so ABIs have been ordered and vascular has been consulted  Iatrogenic volume overload 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 [facility] with [facility] 12/18 placed Short of breath/winded 12/23 needing oxygen  although mentating okay-given Demadex  by nephrology 100 and will be going for HD -continues on prednisone  taper per nephrology EOT 04/28/2024--- leukocytosis could be secondary to steroids that are being given--- see below  Nonspecific leukocytosis Chest x-ray more fluid to me than pneumonia, lactic acid 2.0-Will start ceftriaxone /azithromycin for circumscribed 3 days and reassess with an x-ray in the morning  Boerhaave  syndrome,--- events as above-- moderate to severe abdominal pain Ischemic ulcer disease--GI and cardiothoracic  aware-several CTs since 11/25---CT scan done 12/21 unremarkable-- Not eating soft diet at all --- diet tolerance limited p.o.-impulsive eater-prior severe Gastritis-continue PEG feeds-NO Carafate  with HD Last esophagram as above 12/1 Pantoprazole  40 IV twice daily, Pepcid  back to 20 daily  Cardiac arrest 06/2023 with HF MR EF 55-65% 03/27/2024 A-fib RVR CHADVASC >4 on Eliquis  [cardioverted 06/2023] Holding Eliquis  since yesterday given need for transfusion RVR occurred at HD 12/21  cardiology consulted 12/21-patient continues Amio 200 twice daily --started Coreg  6.25 twice daily and intermittent A-fib noted Entresto  and torsemide  for ever held given current issues Amlodipine  held to facilitate rate control  Normocytic anemia secondary to peptic ulcer disease Transfused x 1 in October another 03/29/2024 Transfuse 1 unit PRBC 12/23 Suspect ongoing microscopic losses secondary to severe gastritis  Severe Malnutrition S/p PEG--ongoing as above  DVT PE March 2025 Remains anticoagulated and does have IVC filter in place since March  Insomnia May continue trazodone  50-cut back Seroquel  to 12.5 at bedtime Cut back hydroxyzine  to 10 3 times daily anxiety  Diabetes CBG 139-140 continues prednisone  as above-continues Lantus  10U for coverage for now  Multiple bedsores and wounds anterior right and left foot and injury stage I to bottom Continue Gerhardt twice daily Lidoderm  patch for back pain---Bed sore last examined 12/22 and looks goof Schedule Lomotil  1 tablet twice daily, can use as needed Lomotil  4 times daily in addition  Resolved/stable problems Petechial rash Hydralazine  and Unasyn  have been stopped, cannot rule out HSP--see stable to me Acute hypoxic respiratory failure//Aspiration Pneumonia//Pleural effusion//Acute on chronic diastolic CHF exacerbation ?respiratory  failure due to pneumonia, pleural effusion, CHF exacerbation -- Better c Lasix  and antibiotics. .  Bilateral UE swelling--duplex upper extremities ruled out DVT Lung nodules-repeat CT in Jan/Feb window  CRITICAL CARE Performed by: Colen Grimes   Total critical care time: 50 minutes  Critical care time was exclusive of separately billable procedures and treating other patients.  Critical care was necessary to treat or prevent imminent or life-threatening deterioration.  Critical care was time spent personally by me on the following activities: development of treatment plan with patient and/or surrogate as well as nursing, discussions with consultants, evaluation of patient's response to treatment, examination of patient, obtaining history from patient or surrogate, ordering and performing treatments and interventions, ordering and review of laboratory studies, ordering and review of radiographic studies, pulse oximetry and re-evaluation of patient's condition.    subj   Tachycardic tachypneic this morning and required oxygen  overnight--he is arousable but has progressively become a little bit more sleepy today   Physical Exam: BP 129/75 (BP Location: Left Arm)   Pulse (!) 111   Temp 98.3 F (36.8 C) (Oral)   Resp (!) 22   Ht 6' 1 (1.854 m)   Wt 77.3 kg   SpO2 94%   BMI 22.48 kg/m   Chroncially ill apppearing, lying in bed, sleepy today We looked at his legs again this morning and today slightly cooler near the tips of the toes versus the upper legs Is able to move them okay he is able to cooperate little bit and raise the leg-I am not able to palpate pedal pulses manually Chest is clear posterolaterally with no wheeze to me S1-S2 no murmur no rub no gallop he does have A-fib on the monitors Abdomen is soft without rebound or guarding PEG tube is in place   Data Reviewed: Sodium 132 potassium 4.5 BUN/creatinine 89/3.2 LFTs normal WBC 16.1 hemoglobin 7.1 platelet  197  Jai-Gurmukh Ondria Oswald,  MD 04/09/2024 11:30 AM  For on call review [email or web address].

## 2024-04-09 NOTE — Plan of Care (Signed)
 " Problem: Education: Goal: Knowledge of General Education information will improve Description: Including pain rating scale, medication(s)/side effects and non-pharmacologic comfort measures 04/09/2024 1943 by Emmitt Browning, RN Outcome: Progressing 04/09/2024 1942 by Emmitt Browning, RN Outcome: Progressing   Problem: Health Behavior/Discharge Planning: Goal: Ability to manage health-related needs will improve 04/09/2024 1943 by Emmitt Browning, RN Outcome: Progressing 04/09/2024 1942 by Emmitt Browning, RN Outcome: Progressing   Problem: Clinical Measurements: Goal: Will remain free from infection 04/09/2024 1943 by Emmitt Browning, RN Outcome: Progressing 04/09/2024 1942 by Emmitt Browning, RN Outcome: Progressing Goal: Diagnostic test results will improve 04/09/2024 1943 by Emmitt Browning, RN Outcome: Progressing 04/09/2024 1942 by Emmitt Browning, RN Outcome: Progressing Goal: Respiratory complications will improve 04/09/2024 1943 by Emmitt Browning, RN Outcome: Progressing 04/09/2024 1942 by Emmitt Browning, RN Outcome: Progressing Goal: Cardiovascular complication will be avoided 04/09/2024 1943 by Emmitt Browning, RN Outcome: Progressing 04/09/2024 1942 by Emmitt Browning, RN Outcome: Progressing   Problem: Activity: Goal: Risk for activity intolerance will decrease 04/09/2024 1943 by Emmitt Browning, RN Outcome: Progressing 04/09/2024 1942 by Emmitt Browning, RN Outcome: Progressing   Problem: Nutrition: Goal: Adequate nutrition will be maintained 04/09/2024 1943 by Emmitt Browning, RN Outcome: Progressing 04/09/2024 1942 by Emmitt Browning, RN Outcome: Progressing   Problem: Coping: Goal: Level of anxiety will decrease 04/09/2024 1943 by Emmitt Browning, RN Outcome: Progressing 04/09/2024 1942 by Emmitt Browning, RN Outcome: Progressing   Problem: Elimination: Goal: Will not experience  complications related to bowel motility 04/09/2024 1943 by Emmitt Browning, RN Outcome: Progressing 04/09/2024 1942 by Emmitt Browning, RN Outcome: Progressing Goal: Will not experience complications related to urinary retention 04/09/2024 1943 by Emmitt Browning, RN Outcome: Progressing 04/09/2024 1942 by Emmitt Browning, RN Outcome: Progressing   Problem: Pain Managment: Goal: General experience of comfort will improve and/or be controlled 04/09/2024 1943 by Emmitt Browning, RN Outcome: Progressing 04/09/2024 1942 by Emmitt Browning, RN Outcome: Progressing   Problem: Safety: Goal: Ability to remain free from injury will improve 04/09/2024 1943 by Emmitt Browning, RN Outcome: Progressing 04/09/2024 1942 by Emmitt Browning, RN Outcome: Progressing   Problem: Skin Integrity: Goal: Risk for impaired skin integrity will decrease 04/09/2024 1943 by Emmitt Browning, RN Outcome: Progressing 04/09/2024 1942 by Emmitt Browning, RN Outcome: Progressing   Problem: Education: Goal: Ability to describe self-care measures that may prevent or decrease complications (Diabetes Survival Skills Education) will improve 04/09/2024 1943 by Emmitt Browning, RN Outcome: Progressing 04/09/2024 1942 by Emmitt Browning, RN Outcome: Progressing   Problem: Coping: Goal: Ability to adjust to condition or change in health will improve 04/09/2024 1943 by Emmitt Browning, RN Outcome: Progressing 04/09/2024 1942 by Emmitt Browning, RN Outcome: Progressing   Problem: Fluid Volume: Goal: Ability to maintain a balanced intake and output will improve 04/09/2024 1943 by Emmitt Browning, RN Outcome: Progressing 04/09/2024 1942 by Emmitt Browning, RN Outcome: Progressing   Problem: Health Behavior/Discharge Planning: Goal: Ability to identify and utilize available resources and services will improve 04/09/2024 1943 by Emmitt Browning, RN Outcome:  Progressing 04/09/2024 1942 by Emmitt Browning, RN Outcome: Progressing Goal: Ability to manage health-related needs will improve 04/09/2024 1943 by Emmitt Browning, RN Outcome: Progressing 04/09/2024 1942 by Emmitt Browning, RN Outcome: Progressing   Problem: Metabolic: Goal: Ability to maintain appropriate glucose levels will improve 04/09/2024 1943 by Emmitt Browning, RN Outcome: Progressing 04/09/2024 1942 by Emmitt Browning, RN Outcome: Progressing   Problem: Nutritional: Goal: Maintenance of adequate nutrition will improve 04/09/2024 1943 by Emmitt Browning, RN Outcome: Progressing 04/09/2024 1942 by Emmitt Browning, RN Outcome: Progressing  Goal: Progress toward achieving an optimal weight will improve 04/09/2024 1943 by Emmitt Browning, RN Outcome: Progressing 04/09/2024 1942 by Emmitt Browning, RN Outcome: Progressing   Problem: Skin Integrity: Goal: Risk for impaired skin integrity will decrease 04/09/2024 1943 by Emmitt Browning, RN Outcome: Progressing 04/09/2024 1942 by Emmitt Browning, RN Outcome: Progressing   Problem: Tissue Perfusion: Goal: Adequacy of tissue perfusion will improve 04/09/2024 1943 by Emmitt Browning, RN Outcome: Progressing 04/09/2024 1942 by Emmitt Browning, RN Outcome: Progressing   Problem: Activity: Goal: Ability to tolerate increased activity will improve 04/09/2024 1943 by Emmitt Browning, RN Outcome: Progressing 04/09/2024 1942 by Emmitt Browning, RN Outcome: Progressing   Problem: Respiratory: Goal: Ability to maintain a clear airway and adequate ventilation will improve 04/09/2024 1943 by Emmitt Browning, RN Outcome: Progressing 04/09/2024 1942 by Emmitt Browning, RN Outcome: Progressing   Problem: Role Relationship: Goal: Method of communication will improve 04/09/2024 1943 by Emmitt Browning, RN Outcome: Progressing 04/09/2024 1942 by Emmitt Browning,  RN Outcome: Progressing   Problem: Education: Goal: Ability to describe self-care measures that may prevent or decrease complications (Diabetes Survival Skills Education) will improve 04/09/2024 1943 by Emmitt Browning, RN Outcome: Progressing 04/09/2024 1942 by Emmitt Browning, RN Outcome: Progressing Goal: Individualized Educational Video(s) 04/09/2024 1943 by Emmitt Browning, RN Outcome: Progressing 04/09/2024 1942 by Emmitt Browning, RN Outcome: Progressing   Problem: Coping: Goal: Ability to adjust to condition or change in health will improve 04/09/2024 1943 by Emmitt Browning, RN Outcome: Progressing 04/09/2024 1942 by Emmitt Browning, RN Outcome: Progressing   Problem: Fluid Volume: Goal: Ability to maintain a balanced intake and output will improve 04/09/2024 1943 by Emmitt Browning, RN Outcome: Progressing 04/09/2024 1942 by Emmitt Browning, RN Outcome: Progressing   Problem: Health Behavior/Discharge Planning: Goal: Ability to identify and utilize available resources and services will improve 04/09/2024 1943 by Emmitt Browning, RN Outcome: Progressing 04/09/2024 1942 by Emmitt Browning, RN Outcome: Progressing Goal: Ability to manage health-related needs will improve 04/09/2024 1943 by Emmitt Browning, RN Outcome: Progressing 04/09/2024 1942 by Emmitt Browning, RN Outcome: Progressing   Problem: Metabolic: Goal: Ability to maintain appropriate glucose levels will improve 04/09/2024 1943 by Emmitt Browning, RN Outcome: Progressing 04/09/2024 1942 by Emmitt Browning, RN Outcome: Progressing   Problem: Nutritional: Goal: Maintenance of adequate nutrition will improve 04/09/2024 1943 by Emmitt Browning, RN Outcome: Progressing 04/09/2024 1942 by Emmitt Browning, RN Outcome: Progressing Goal: Progress toward achieving an optimal weight will improve 04/09/2024 1943 by Emmitt Browning, RN Outcome:  Progressing 04/09/2024 1942 by Emmitt Browning, RN Outcome: Progressing   Problem: Skin Integrity: Goal: Risk for impaired skin integrity will decrease 04/09/2024 1943 by Emmitt Browning, RN Outcome: Progressing 04/09/2024 1942 by Emmitt Browning, RN Outcome: Progressing   Problem: Tissue Perfusion: Goal: Adequacy of tissue perfusion will improve 04/09/2024 1943 by Emmitt Browning, RN Outcome: Progressing 04/09/2024 1942 by Emmitt Browning, RN Outcome: Progressing   "

## 2024-04-09 NOTE — Progress Notes (Signed)
 OT Cancellation Note  Patient Details Name: Zahmir Lalla MRN: [id number] DOB: 28-Apr-1950   Cancelled Treatment:    Reason Eval/Treat Not Completed: Patient at procedure or test/ unavailable Pt of unit for HD and unable to participate in OT treatment session this afternoon. Will re-attempt at another time.   Leita Howell, OTR/L,CBIS  Supplemental OT - [facility] and [facility] Secure Chat Preferred   04/09/2024, 3:06 PM

## 2024-04-09 NOTE — Progress Notes (Signed)
 Nutrition Follow-up  DOCUMENTATION CODES:   Severe malnutrition in context of acute illness/injury  INTERVENTION:   Continue Tube Feeding via PEG to meet full nutrition needs:  Vital 1.5 at 65 ml/hr Pro-Source TF20 60 mL BID TF at goal rate provides 145 g of protein, 2500 kcals, 1186 mL of free water  over 24 hours  Monitor PO intake and tolerance of PO Pt has hx of not tolerating PO diet, but continues to tolerate PEG feeds Appears to be tolerating soft diet   NUTRITION DIAGNOSIS:   Severe Malnutrition related to acute illness (may have chronic component due to time frame of reported wt loss) as evidenced by severe fat depletion, moderate muscle depletion.  Being addressed via TF, oral diet  GOAL:   Patient will meet greater than or equal to 90% of their needs  Progressing  MONITOR:   PO intake, Supplement acceptance, TF tolerance, Labs, I & O's  REASON FOR ASSESSMENT:   Consult Enteral/tube feeding initiation and management  ASSESSMENT:   73 yo male admitted post several days of N/V followed by sudden onset of severe epigastric pain radiating to chest and back. Pt found to have small esophageal perforation above GE junction. PMH includes CAD, chronic HFpEF, HTN, HLD, DVT, CVA, PAF, CKD 3b.  12/15 Diet advanced to Renal  12/16 CRRT discontinued, Renal Biopsy performed 12/18 adv to regular diet 12/19 made NPO 12/22 adv to DYS 3 diet  Pt continues to receive HD treatments as needed for renal recovery, nephrology hopeful for recovery. Pt appears with moderate pitting edema today, will receive HD treatment today. Pt very lethargic at time of assessment. Unable to answer questions. Diet advanced from NPO yesterday to DYS 3 (soft diet), pt has only eaten 25% of 1 meal since and ate 0% of breakfast this morning. Pt unable to discuss toleration of PO intake. Spoke with MD, agrees to continue meeting pt's full calorie and protein needs via PEG until PO intake becomes  consistently tolerated in the setting of gastritis.  Vascular following to assess lower extremities.   Average Meal Completion: 12/22: 0-25% average intake x 2 recorded meals  Nutritionally Relevant Medications: Amiodarone  BID Famotidine   Nutrisource fiber BID Lasix  SSI 0-9 units q4h Lantus  10 units daily RenaVit daily Protonix  BID Prednisone   Torsemide   Unasyn    Labs reviewed: CBG x 24 h: 139-172 mg/dL Sodium 869/ Cl 94 Potassium 5.2 BUN 107/Cr 4.12  Diet Order:   Diet Order             DIET DYS 3 Room service appropriate? Yes; Fluid consistency: Thin  Diet effective now                   EDUCATION NEEDS:   Education needs have been addressed  Skin:  Skin Assessment: Reviewed RN Assessment Skin Integrity Issues:: Stage I Stage I: n/a  Last BM:  12/23 type 7 x 1 occurence  Height:   Ht Readings from Last 1 Encounters:  03/29/24 6' 1 (1.854 m)    Weight:   Wt Readings from Last 1 Encounters:  04/09/24 79.1 kg    Ideal Body Weight:  83.6 kg  BMI:  Body mass index is 23.01 kg/m.  Estimated Nutritional Needs:   Kcal:  2500-2700 kcals  Protein:  130-150 g  Fluid:  1L plus UOP     Josette Glance, MS, RDN, LDN Clinical Dietitian I Please reach out via secure chat

## 2024-04-09 NOTE — Progress Notes (Signed)
 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  He has completed rounds of antibiotics.  Mild interstitial fibrosis/tubular atrophy.  - Given concern for infection we provided steroids but avoided other IS medications due to weighing risks/benefits - on pred taper now with plans to wean quickly given the main concern is infection as a cause -he was on minoxidil  which caries similar risk for AI kidney phenomena as hydralazine . I stopped this and started amlodipine  (he had swelling previously so will monitor) -some signs of renal recovery -did receive contrast on 12/21 which may hinder Crt improvement -plan for HD today with volume removal as tolerated; likely next HD Friday -start torsemide  tomorrow to help manage volume    Abdominal pain -CT without anything glaring. Stable at this time per patientDefer to Primary   Petechial rash;             - 2/2 Iga. Mgmt as above   Acute on chronic CHF exacerbation;             - seems to have more volume today. HD and diuretics as above   H/o DVT and PE:             - had RH strain previously             - on eliquis    Boorehave's syndrome             - s/p repair             - Has a PEG  - EGD 03/19/24 with ulcers  - PPI and pepcid    HTN  -stopped minoxidil . On BB and amlodipine  (edema in the past). Could consider ARB as Crt improves  Dispo: inpt  FYI -- Holiday Schedule for next week Usual MWF schedule -> Sun (21), Tues (23), Friday (26)     Subjective:    Patient feels okay.  Continues to have some urine output.  Denied shortness of breath but after  leaving bedside nurse reported worsening shortness of breath associated with tachycardia.  Creatinine rising and BUN elevated   Objective:   BP 129/75 (BP Location: Left Arm)   Pulse (!) 111   Temp 98.3 F (36.8 C) (Oral)   Resp (!) 22   Ht 6' 1 (1.854 m)   Wt 77.3 kg   SpO2 94%   BMI 22.48 kg/m   Intake/Output Summary (Last 24 hours) at 04/09/2024 9046 Last data filed at 04/09/2024 0400 Gross per 24 hour  Intake 6688 ml  Output 400 ml  Net 6288 ml   Weight change: -0.2 kg  Physical Exam: GEN: Lying in bed, no distress PULM: Bilateral chest rise with no increased work of breathing CV: Tachycardic, no rub ABD: + PEG, mild distention EXT: 1+ edema in the lower extremities with 2+ in the right upper extremity SKIN: petechial nonblanching rash thighs, palms, arms NEURO: awake, alert, interactive Dialysis access: RIJ temp HD catheter  Imaging: DG CHEST PORT 1 VIEW Result Date: 04/09/2024 CLINICAL DATA:  Pneumonia. EXAM: PORTABLE CHEST 1 VIEW COMPARISON:  03/30/2024 FINDINGS: Right jugular  central venous catheter is seen with tip at the superior cavoatrial junction. No pneumothorax visualized. Stable mild cardiomegaly. Small bilateral pleural effusions again seen, with decreased size on the right. The scarring again seen in the lateral right lung base. Mixed diffuse interstitial infiltrates and patchy airspace opacities show no significant change, and may be due to edema or infectious/inflammatory etiologies. No new or worsening areas of pulmonary opacity are seen. IMPRESSION: No significant change in mixed diffuse interstitial infiltrates and patchy airspace opacities. Small bilateral pleural effusions, with decreased size on the right. Electronically Signed   By: Norleen DELENA Kil M.D.   On: 04/09/2024 09:30   CT ABDOMEN PELVIS W CONTRAST Result Date: 04/07/2024 CLINICAL DATA:  Peritonitis or perforation suspected. EXAM: CT ABDOMEN AND PELVIS WITH CONTRAST TECHNIQUE: Multidetector CT  imaging of the abdomen and pelvis was performed using the standard protocol following bolus administration of intravenous contrast. RADIATION DOSE REDUCTION: This exam was performed according to the departmental dose-optimization program which includes automated exposure control, adjustment of the mA and/or kV according to patient size and/or use of iterative reconstruction technique. CONTRAST:  75mL OMNIPAQUE  IOHEXOL  350 MG/ML SOLN COMPARISON:  CT abdomen pelvis dated 03/24/2024. FINDINGS: Lower chest: Partially visualized small bilateral pleural effusions with partial compressive atelectasis of the lower lobes. Mild cardiomegaly. No intra-abdominal free air.  Small ascites. Hepatobiliary: The liver is unremarkable. There is mild biliary ductal dilatation versus mild periportal edema. Tiny layering stone noted in the gallbladder neck. No pericholecystic fluid or evidence of acute cholecystitis by CT. Pancreas: Unremarkable. No pancreatic ductal dilatation or surrounding inflammatory changes. Spleen: Normal in size without focal abnormality. Adrenals/Urinary Tract: The adrenal glands unremarkable. The left kidney is atrophic. The right kidney is unremarkable. The visualized ureters appear unremarkable. The urinary bladder is decompressed around a Foley catheter. 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 There is moderate stool throughout the colon. There is no bowel obstruction or active inflammation. The appendix is normal. Vascular/Lymphatic: Moderate aortoiliac atherosclerotic disease. The IVC is unremarkable. An infrarenal IVC filter is noted. No portal venous gas. The SMV, splenic vein, and main portal vein are patent. There is no adenopathy. Reproductive: The prostate gland is grossly unremarkable. Other: There is diffuse subcutaneous edema and anasarca. Musculoskeletal: No acute osseous pathology. Degenerative changes of the spine. IMPRESSION: 1. No bowel obstruction.  Normal appendix. 2. Small ascites and anasarca. 3. Partially visualized small bilateral pleural effusions with partial compressive atelectasis of the lower lobes. 4. Cholelithiasis. 5.  Aortic Atherosclerosis (ICD10-I70.0). Electronically Signed   By: Vanetta Chou M.D.   On: 04/07/2024 16:14     Labs: BMET Recent Labs  Lab 04/03/24 0517 04/04/24 0410 04/05/24 0415 04/06/24 0900 04/07/24 0252 04/07/24 2250 04/08/24 0444 04/09/24 0424  NA 133* 132* 134* 133* 134* 132* 132* 130*  K 4.9 5.0 5.3* 4.4 4.7 4.8 4.5 5.2*  CL 98 98 99 96* 96* 95* 94* 94*  CO2 26 22 21* 28 26 28 26 24   GLUCOSE 153* 123* 162* 147* 147* 105* 131* 128*  BUN 53* 80* 101* 69* 96* 85* 89* 107*  CREATININE 2.57* 3.63* 4.33* 2.92* 3.64* 3.20* 3.26* 4.12*  CALCIUM  7.9* 7.7* 8.3* 8.0* 8.1* 7.9* 8.0* 8.0*  PHOS 3.6 4.3 4.9* 3.7 4.4  --  3.7 3.2   CBC Recent Labs  Lab 04/06/24 0900 04/07/24 0252 04/08/24 0444 04/09/24 0424  WBC 16.3* 18.2* 16.1* 29.5*  HGB 7.1* 7.8* 7.1* 8.4*  HCT 22.4* 23.8* 21.8* 25.6*  MCV 82.1  81.5 82.9 84.2  PLT 203 226 197 195    Medications:     alteplase   2 mg Intracatheter Once   alteplase   2 mg Intracatheter Once   amiodarone   200 mg Per Tube BID   amLODipine   5 mg Oral Daily   atorvastatin   80 mg Per Tube Daily   carvedilol   3.125 mg Per Tube BID WC   Chlorhexidine  Gluconate Cloth  6 each Topical Daily   clonazePAM   0.5 mg Per Tube BID   ezetimibe   10 mg Per Tube Daily   famotidine   20 mg Per Tube Daily   feeding supplement (PROSource TF20)  60 mL Per Tube BID   fiber  1 packet Per Tube BID   [name] butt cream   Topical BID   guaiFENesin   10 mL Per Tube Q4H   insulin  aspart  0-9 Units Subcutaneous Q4H   insulin  glargine  10 Units Subcutaneous Daily   lidocaine   1 patch Transdermal Q24H   multivitamin  1 tablet Per Tube QHS   pantoprazole  (PROTONIX ) IV  40 mg Intravenous Q12H   predniSONE   40 mg Per Tube Q breakfast   Followed by   NOREEN ON 04/14/2024]  predniSONE   20 mg Oral Q breakfast   Followed by   NOREEN ON 04/21/2024] predniSONE   10 mg Oral Q breakfast   QUEtiapine   25 mg Per Tube BID   saccharomyces boulardii  250 mg Per Tube BID   sodium chloride  flush  10-40 mL Intracatheter Q12H

## 2024-04-09 NOTE — Plan of Care (Signed)
   Problem: Education: Goal: Knowledge of General Education information will improve Description Including pain rating scale, medication(s)/side effects and non-pharmacologic comfort measures Outcome: Progressing   Problem: Health Behavior/Discharge Planning: Goal: Ability to manage health-related needs will improve Outcome: Progressing
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
## 2024-04-09 NOTE — Consult Note (Signed)
 WOC Nurse Consult Note: Reason for Consult: please eval foot---in Unna--thanks  Wound type: pin point purple areas over the bilateral feet; necrotic area on the plantar surface of the left great toe Pressure Injury POA: NA Measurement:  Toe; 1.0cm x 2.0cm x 0cm  Wound azi:aojrx  Drainage (amount, consistency, odor) none Periwound: see above  Dressing procedure/placement/frequency: Do not recommend replacement of Unna's boots, topical care for wound ordered.  Communicated with MD.    Re consult if needed, will not follow at this time. Thanks  Shakiya Mcneary [employer], RN,CWOCN, CNS, [employer] [phone]

## 2024-04-09 NOTE — Progress Notes (Signed)
 1 Liter ultrafiltration, condition stable post hemodialysis, treatment time decreased - 50 minutes as per Md Coladonato's order secondary to atrial fibrillation with RVR.  This patients heart rate decreased and sustained in the 90's during and after being rinsed back.  He had no complaint of chest pain or shortness of breath.  2 liters nasal cannula was supplied from beginning to the end of his hemodialysis treatment.

## 2024-04-10 ENCOUNTER — Inpatient Hospital Stay (HOSPITAL_COMMUNITY)

## 2024-04-10 DIAGNOSIS — K223 Perforation of esophagus: Secondary | ICD-10-CM | POA: Diagnosis not present

## 2024-04-10 DIAGNOSIS — I7090 Unspecified atherosclerosis: Secondary | ICD-10-CM | POA: Diagnosis not present

## 2024-04-10 LAB — RENAL FUNCTION PANEL
Albumin: 2.2 g/dL — ABNORMAL LOW (ref 3.5–5.0)
Anion gap: 11 (ref 5–15)
BUN: 73 mg/dL — ABNORMAL HIGH (ref 8–23)
CO2: 26 mmol/L (ref 22–32)
Calcium: 8 mg/dL — ABNORMAL LOW (ref 8.9–10.3)
Chloride: 93 mmol/L — ABNORMAL LOW (ref 98–111)
Creatinine, Ser: 3.39 mg/dL — ABNORMAL HIGH (ref 0.61–1.24)
GFR, Estimated: 19 mL/min — ABNORMAL LOW
Glucose, Bld: 172 mg/dL — ABNORMAL HIGH (ref 70–99)
Phosphorus: 2.8 mg/dL (ref 2.5–4.6)
Potassium: 4.5 mmol/L (ref 3.5–5.1)
Sodium: 130 mmol/L — ABNORMAL LOW (ref 135–145)

## 2024-04-10 LAB — CBC
HCT: 23.1 % — ABNORMAL LOW (ref 39.0–52.0)
Hemoglobin: 7.5 g/dL — ABNORMAL LOW (ref 13.0–17.0)
MCH: 27.6 pg (ref 26.0–34.0)
MCHC: 32.5 g/dL (ref 30.0–36.0)
MCV: 84.9 fL (ref 80.0–100.0)
Platelets: 151 K/uL (ref 150–400)
RBC: 2.72 MIL/uL — ABNORMAL LOW (ref 4.22–5.81)
RDW: 17.1 % — ABNORMAL HIGH (ref 11.5–15.5)
WBC: 23.7 K/uL — ABNORMAL HIGH (ref 4.0–10.5)
nRBC: 0 % (ref 0.0–0.2)

## 2024-04-10 LAB — VAS US ABI WITH/WO TBI
Left ABI: 0.99
Right ABI: 1.02

## 2024-04-10 LAB — GLUCOSE, CAPILLARY
Glucose-Capillary: 181 mg/dL — ABNORMAL HIGH (ref 70–99)
Glucose-Capillary: 152 mg/dL — ABNORMAL HIGH (ref 70–99)
Glucose-Capillary: 168 mg/dL — ABNORMAL HIGH (ref 70–99)
Glucose-Capillary: 120 mg/dL — ABNORMAL HIGH (ref 70–99)
Glucose-Capillary: 190 mg/dL — ABNORMAL HIGH (ref 70–99)
Glucose-Capillary: 184 mg/dL — ABNORMAL HIGH (ref 70–99)

## 2024-04-10 NOTE — Plan of Care (Signed)
" °  Problem: Education: Goal: Knowledge of General Education information will improve Description: Including pain rating scale, medication(s)/side effects and non-pharmacologic comfort measures Outcome: Progressing   Problem: Health Behavior/Discharge Planning: Goal: Ability to manage health-related needs will improve Outcome: Progressing   Problem: Clinical Measurements: Goal: Will remain free from infection Outcome: Progressing Goal: Diagnostic test results will improve Outcome: Progressing Goal: Respiratory complications will improve Outcome: Progressing Goal: Cardiovascular complication will be avoided Outcome: Progressing   Problem: Activity: Goal: Risk for activity intolerance will decrease Outcome: Progressing   Problem: Nutrition: Goal: Adequate nutrition will be maintained Outcome: Progressing   Problem: Coping: Goal: Level of anxiety will decrease Outcome: Progressing   Problem: Elimination: Goal: Will not experience complications related to bowel motility Outcome: Progressing Goal: Will not experience complications related to urinary retention Outcome: Progressing   Problem: Pain Managment: Goal: General experience of comfort will improve and/or be controlled Outcome: Progressing   Problem: Safety: Goal: Ability to remain free from injury will improve Outcome: Progressing   Problem: Skin Integrity: Goal: Risk for impaired skin integrity will decrease Outcome: Progressing   Problem: Education: Goal: Ability to describe self-care measures that may prevent or decrease complications (Diabetes Survival Skills Education) will improve Outcome: Progressing   Problem: Coping: Goal: Ability to adjust to condition or change in health will improve Outcome: Progressing   Problem: Fluid Volume: Goal: Ability to maintain a balanced intake and output will improve Outcome: Progressing   Problem: Health Behavior/Discharge Planning: Goal: Ability to identify and  utilize available resources and services will improve Outcome: Progressing Goal: Ability to manage health-related needs will improve Outcome: Progressing   Problem: Metabolic: Goal: Ability to maintain appropriate glucose levels will improve Outcome: Progressing   Problem: Nutritional: Goal: Maintenance of adequate nutrition will improve Outcome: Progressing Goal: Progress toward achieving an optimal weight will improve Outcome: Progressing   Problem: Skin Integrity: Goal: Risk for impaired skin integrity will decrease Outcome: Progressing   Problem: Tissue Perfusion: Goal: Adequacy of tissue perfusion will improve Outcome: Progressing   Problem: Education: Goal: Ability to describe self-care measures that may prevent or decrease complications (Diabetes Survival Skills Education) will improve Outcome: Progressing Goal: Individualized Educational Video(s) Outcome: Progressing   Problem: Coping: Goal: Ability to adjust to condition or change in health will improve Outcome: Progressing   Problem: Fluid Volume: Goal: Ability to maintain a balanced intake and output will improve Outcome: Progressing   Problem: Health Behavior/Discharge Planning: Goal: Ability to identify and utilize available resources and services will improve Outcome: Progressing Goal: Ability to manage health-related needs will improve Outcome: Progressing   Problem: Metabolic: Goal: Ability to maintain appropriate glucose levels will improve Outcome: Progressing   Problem: Nutritional: Goal: Maintenance of adequate nutrition will improve Outcome: Progressing Goal: Progress toward achieving an optimal weight will improve Outcome: Progressing   Problem: Skin Integrity: Goal: Risk for impaired skin integrity will decrease Outcome: Progressing   Problem: Tissue Perfusion: Goal: Adequacy of tissue perfusion will improve Outcome: Progressing   "

## 2024-04-10 NOTE — Progress Notes (Signed)
 ABI's have been completed. Preliminary results can be found in CV Proc through chart review.   04/10/2024 1:47 PM Cathlyn Collet RVT

## 2024-04-10 NOTE — Progress Notes (Addendum)
" °  Progress Note    04/10/2024 7:03 AM Hospital Day 80  Subjective:  no complaints about his feet.  afebrile  Vitals:   04/09/24 2009 04/10/24 0456  BP: (!) 118/57 124/69  Pulse: 99 92  Resp: 19 19  Temp: 98.1 F (36.7 C) 98.6 F (37 C)  SpO2: 95% 91%    Physical Exam: General:  no distress Lungs:  non labored Extremities:  bilateral feet are warm.    CBC    Component Value Date/Time   WBC 23.7 (H) 04/10/2024 0055   RBC 2.72 (L) 04/10/2024 0055   HGB 7.5 (L) 04/10/2024 0055   HGB 12.0 (L) 09/27/2023 0846   HCT 23.1 (L) 04/10/2024 0055   HCT 40.5 09/27/2023 0846   PLT 151 04/10/2024 0055   PLT 238 09/27/2023 0846   MCV 84.9 04/10/2024 0055   MCV 88 09/27/2023 0846   MCH 27.6 04/10/2024 0055   MCHC 32.5 04/10/2024 0055   RDW 17.1 (H) 04/10/2024 0055   RDW 17.3 (H) 09/27/2023 0846   LYMPHSABS 0.5 (L) 03/27/2024 0500   MONOABS 0.9 03/27/2024 0500   EOSABS 0.1 03/27/2024 0500   BASOSABS 0.0 03/27/2024 0500    BMET    Component Value Date/Time   NA 130 (L) 04/10/2024 0055   NA 141 01/01/2024 0813   K 4.5 04/10/2024 0055   CL 93 (L) 04/10/2024 0055   CO2 26 04/10/2024 0055   GLUCOSE 172 (H) 04/10/2024 0055   BUN 73 (H) 04/10/2024 0055   BUN 42 (H) 01/01/2024 0813   CREATININE 3.39 (H) 04/10/2024 0055   CALCIUM  8.0 (L) 04/10/2024 0055   GFRNONAA 19 (L) 04/10/2024 0055   GFRAA 29 (L) 08/31/2019 1120    INR    Component Value Date/Time   INR 1.2 04/02/2024 0423     Intake/Output Summary (Last 24 hours) at 04/10/2024 0703 Last data filed at 04/10/2024 0656 Gross per 24 hour  Intake 3156.92 ml  Output 1250 ml  Net 1906.92 ml     Assessment/Plan:  73 y.o. male with BLE foot wounds  Hospital Day 80  -no complaints this am about his feet.  bilateral feet are warm.  -ABI ordered for today, which will help determine necessity of vascular intervention.    Lucie Apt, PA-C [facility] [phone] 04/10/2024 7:03  AM  VASCULAR STAFF ADDENDUM: I have independently interviewed and examined the patient. I agree with the above.  Palpable pulses with normal ABIs.  No indication for vascular invention at this time.  Norman GORMAN Serve MD [facility] of [facility] Phone Number: [phone] 04/10/2024 4:23 PM  "

## 2024-04-10 NOTE — Progress Notes (Signed)
 [facility] Progress Note   Assessment/ Plan:   AKI on CKD 3b             - at baseline has L sided near occlusive renal artery  -s/p CRRT 12/10-12/16  - ANA +, C4 low, + hematuria, anti-histone Ab +. 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  He has completed rounds of antibiotics.  Mild interstitial fibrosis/tubular atrophy.  - Given concern for infection we provided steroids but avoided other IS medications due to weighing risks/benefits - on pred taper now with plans to wean quickly given the main concern is infection as a cause -he was on minoxidil  which caries similar risk for AI kidney phenomena as hydralazine . I stopped this and started amlodipine  (he had swelling previously so will monitor) -some signs of renal recovery -did receive contrast on 12/21 which may hinder Crt improvement - Dialysis sessions have been cut short because of A-fib with RVR -Hopefully plan for next dialysis session on Friday but monitor for recovery -start torsemide  daily to help with volume management   Petechial rash;             - 2/2 Iga. Mgmt as above   Acute on chronic CHF exacerbation;             - seems to have more volume today. HD and diuretics as above   H/o DVT and PE:             - had RH strain previously             - on eliquis    Boorehave's syndrome             - s/p repair             - Has a PEG  - EGD 03/19/24 with ulcers  - PPI and pepcid    HTN  -stopped minoxidil . On BB and amlodipine  (edema in the past). Could consider ARB as Crt improves  Dispo: inpt  FYI -- Holiday Schedule for next week Usual MWF schedule -> Sun (21), Tues (23), Friday (26)     Subjective:   Feels okay with no complaints today.  Tolerated dialysis for the most part but did have A-fib with RVR and dialysis was cut  about an hour short.   Objective:   BP (!) 120/57   Pulse (!) 106   Temp 98.5 F (36.9 C)   Resp 18   Ht 6' 1 (1.854 m)   Wt 77.7 kg   SpO2 91%   BMI 22.60 kg/m   Intake/Output Summary (Last 24 hours) at 04/10/2024 9178 Last data filed at 04/10/2024 9343 Gross per 24 hour  Intake 3156.92 ml  Output 1250 ml  Net 1906.92 ml   Weight change: 1.8 kg  Physical Exam: GEN: Lying in bed, no distress PULM: Bilateral chest rise with no increased work of breathing CV: Tachycardic, no rub ABD: + PEG, mild distention EXT: Trace edema in the lower extremities, 1+ in the bilateral upper extremities SKIN: petechial nonblanching rash thighs, palms, arms NEURO: awake, alert, interactive Dialysis access: RIJ temp HD catheter  Imaging: DG CHEST PORT 1 VIEW Result Date: 04/09/2024 CLINICAL DATA:  Pneumonia. EXAM: PORTABLE CHEST 1 VIEW COMPARISON:  03/30/2024 FINDINGS: Right jugular central venous catheter is seen with tip at the superior cavoatrial junction. No pneumothorax visualized. Stable mild cardiomegaly. Small  bilateral pleural effusions again seen, with decreased size on the right. The scarring again seen in the lateral right lung base. Mixed diffuse interstitial infiltrates and patchy airspace opacities show no significant change, and may be due to edema or infectious/inflammatory etiologies. No new or worsening areas of pulmonary opacity are seen. IMPRESSION: No significant change in mixed diffuse interstitial infiltrates and patchy airspace opacities. Small bilateral pleural effusions, with decreased size on the right. Electronically Signed   By: Norleen DELENA Kil M.D.   On: 04/09/2024 09:30     Labs: BMET Recent Labs  Lab 04/04/24 0410 04/05/24 0415 04/06/24 0900 04/07/24 0252 04/07/24 2250 04/08/24 0444 04/09/24 0424 04/10/24 0055  NA 132* 134* 133* 134* 132* 132* 130* 130*  K 5.0 5.3* 4.4 4.7 4.8 4.5 5.2* 4.5  CL 98 99 96* 96* 95* 94* 94* 93*  CO2 22 21* 28 26 28 26 24 26    GLUCOSE 123* 162* 147* 147* 105* 131* 128* 172*  BUN 80* 101* 69* 96* 85* 89* 107* 73*  CREATININE 3.63* 4.33* 2.92* 3.64* 3.20* 3.26* 4.12* 3.39*  CALCIUM  7.7* 8.3* 8.0* 8.1* 7.9* 8.0* 8.0* 8.0*  PHOS 4.3 4.9* 3.7 4.4  --  3.7 3.2 2.8   CBC Recent Labs  Lab 04/07/24 0252 04/08/24 0444 04/09/24 0424 04/10/24 0055  WBC 18.2* 16.1* 29.5* 23.7*  HGB 7.8* 7.1* 8.4* 7.5*  HCT 23.8* 21.8* 25.6* 23.1*  MCV 81.5 82.9 84.2 84.9  PLT 226 197 195 151    Medications:     amiodarone   200 mg Per Tube BID   atorvastatin   80 mg Per Tube Daily   carvedilol   3.125 mg Per Tube BID WC   Chlorhexidine  Gluconate Cloth  6 each Topical Daily   clonazePAM   0.5 mg Per Tube BID   ezetimibe   10 mg Per Tube Daily   famotidine   20 mg Per Tube Daily   feeding supplement (PROSource TF20)  60 mL Per Tube BID   fiber  1 packet Per Tube BID   [name] butt cream   Topical BID   guaiFENesin   10 mL Per Tube Q4H   insulin  aspart  0-9 Units Subcutaneous Q4H   insulin  glargine  10 Units Subcutaneous Daily   lidocaine   1 patch Transdermal Q24H   multivitamin  1 tablet Per Tube QHS   pantoprazole  (PROTONIX ) IV  40 mg Intravenous Q12H   predniSONE   40 mg Per Tube Q breakfast   Followed by   NOREEN ON 04/14/2024] predniSONE   20 mg Oral Q breakfast   Followed by   NOREEN ON 04/21/2024] predniSONE   10 mg Oral Q breakfast   QUEtiapine   12.5 mg Per Tube QHS   saccharomyces boulardii  250 mg Per Tube BID   sodium chloride  flush  10-40 mL Intracatheter Q12H   torsemide   100 mg Oral Daily

## 2024-04-10 NOTE — Progress Notes (Signed)
 Billy [name] [id number] Admit--01/21/2024  1:34 PM    Code Status: Full Code   73 year old male Prolonged admission 3/10-07/20/18/2025 cardiac arrest requiring ICU management cardioverted for A-fib-found to have DVTs then had hematochezia necessitating IVC filter-recurrent GI bleed secondary to erosive gastropathy and friable duodenal mucosa and colonoscopy was deferred and then chest tube placements cardiac cath at that time showed CAD with CTO of RCA and was treated with diuretics and sent home on torsemide  Entresto  Jardiance -   Readmitted 10/5 with nausea vomiting sudden tearing sensation found to have an esophageal tear was given K-centra--see below plans   Chronology  10/7 esophageal stent placed by cardiothoracic 10/15 stent reposition 10/16 extubated 10/20 reintubated with ultrasound showing left-sided pleural effusion-left-sided chest tube placed-by lights criteria exudative effusion 10/23 echo 60-65% 10/24 barium swallow showed persistent leak 10/28 transferred out of ICU 11/3 PEG tube placed 11/19 swelling of right arm showing on ultrasound SVT cephalic vein 11/22 chest x-ray?  Pneumonia done because of back pain-follow-up CT scan shows stent in place no mediastinal fluid collection small bilateral pleural effusions with bibasilar atelectasis pneumonia not excluded-scattered clusters nodular densities predominantly right upper and right lower lobes with nodule 11 mm as previously seen?  Mets?  Infection?  Aspiration 11/26 stent removed. Esophagram negative for esophageal leak.  11/27 getting diuresis again 11/28 duodenitis/enteritis.  GI consulted. Bloody emesis added carafate   11/29 trialing clears 12/1 NV again w/ hematemesis. Lasix  held ---  esophagram performed showing patent esophagus without perforation irregularity mid esophagus?  Stent diffuse narrowing lumen distal duodenum and proximal jejunum 12/2 EGD ischemic ulcers. Impression:  - Congested, inflamed, nodular, texture  changed  mucosa in the esophagus. - Gastritis, Non-bleeding duodenal ulcers with a clean ulcer  base (Forrest Class III).- Non-bleeding jejunal ulcers with a clean ulcer   base (Forrest Class III)  - Findings suggestive of ischemic ulcers. Increased carafate  and cont BID PPI  12/3 full liq diet  12/4 marked increased WOB. Acute and progressive w/ increased hypoxia and HTN. PCCM asked to see  12/10 again increased WOB, stubborn to diuresis, unable to oxygenate, to ICU--- azotemia and renal consulted 12/11 Intubated  12/12 extubated 12/13 c/o of epigastric pain but suspect more anxiety component, on cleviprex , CRRT for volume removal 12/15 pain improved, on RA, on clev and CRRT, renal bx pending 12/16 renal biopsy performed showing 12/17 off clev transition to iHD, transfer to floor  12/18: TDC placed 12/19 Billy assumed care, has distended and painful abdomen today, KUB shows improvement so tube feeds resumed, Eliquis  resumed 12/22 hemoccult neg---transfuse 1 U 12/23 vascular consulted secondary to lower extremities--- sleepy this morning-x-ray shows congestion no pneumonia white count up  PLAN    Iatrogenic volume overload 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  Nonspecific leukocytosis Chest x-ray more fluid to me than pneumonia, lactic acid 2.0-on Unasyn --CXR tomorrow post HD--chest seems clear to me on exam  Boerhaave syndrome,--- events as above-- moderate to severe abdominal pain Ischemic ulcer disease--GI and cardiothoracic aware-several CTs since 11/25---CT scan done 12/21 unremarkable-- Not eating soft diet at all --- diet tolerance limited p.o.-impulsive eater-prior severe Gastritis-continue PEG feeds-NO Carafate  with HD Last esophagram as above 12/1 Pantoprazole  40 IV twice  daily, Pepcid  back to 20 daily In the next several days if he does not eat we will need to ask GI to revisit although  I am not really sure if there is a whole lot we can do other than wait for healing of his prior gastritis-his impulsivity makes graduating his diet challenging  Cardiac arrest 06/2023 with HF MR EF 55-65% 03/27/2024 A-fib RVR CHADVASC >4 on Eliquis  [cardioverted 06/2023] Initial thought was to hold Eliquis -continue the same for now RVR occurred at HD 12/21  cardiology consulted 12/21-patient continues Amio 200 twice daily --started Coreg  6.25 twice daily and intermittent A-fib noted Entresto  and torsemide  for ever held given current issues Amlodipine  held to facilitate rate control  Normocytic anemia secondary to peptic ulcer disease Transfused x 1 in October another 03/29/2024 Transfuse 1 unit PRBC 12/23 Suspect ongoing microscopic losses secondary to severe gastritis  Severe Malnutrition S/p PEG--ongoing as above  DVT PE March 2025 Remains anticoagulated and does have IVC filter in place since March  Insomnia May continue trazodone  50-? Seroquel  to 12.5 at bedtime---? hydroxyzine  to 10 3 times daily anxiety  Diabetes CBG 120-190 continues prednisone  as above-continues Lantus  10U for coverage for now  Multiple bedsores and wounds anterior right and left foot and injury stage I to bottom Continue Gerhardt twice daily Lidoderm  patch for back pain---Bed sore last examined 12/22 and looks goof Schedule Lomotil  1 tablet twice daily, can use as needed Lomotil  4 times daily in addition  Resolved/stable problems Petechial rash Hydralazine  and Unasyn  have been stopped, cannot rule out HSP--see stable to me Acute hypoxic respiratory failure//Aspiration Pneumonia//Pleural effusion//Acute on chronic diastolic CHF exacerbation ?respiratory failure due to pneumonia, pleural effusion, CHF exacerbation -- Better c Lasix  and antibiotics. Intemrittently seems to have this Bilateral  UE swelling--duplex upper extremities ruled out DVT  Lower limb ischemia ruled out 12.23 ABI rules out ischmeia--appreciate VVS--no intervention needed Lung nodules-repeat CT in Jan/Feb window  CRITICAL CARE Performed by: Colen Grimes  subj   disgruntled  Physical Exam: BP 129/66   Pulse 82   Temp 98.2 F (36.8 C)   Resp 18   Ht 6' 1 (1.854 m)   Wt 77.7 kg   SpO2 95%   BMI 22.60 kg/m   Chroncially ill apppearing, lying in bed, sleepy today Cta b no added sound Chest is clear posterolaterally with no wheeze to me S1-S2 no murmur no rub no gallop he does have A-fib on the monitors Abdomen is soft without rebound or guarding PEG tube is in place   Data Reviewed: Sodium 132 potassium 4.5 BUN/creatinine 89/3.2 LFTs normal WBC 16.1 hemoglobin 7.1 platelet 197  Colen Grimes, MD 04/10/2024 4:51 PM  For on call review [email or web address].

## 2024-04-10 NOTE — Progress Notes (Signed)
 Physical Therapy Treatment Patient Details Name: Billy [name] MRN: [id number] DOB: 1951/01/01 Today's Date: 04/10/2024   History of Present Illness Billy [name] is a 73 y.o. male who presented to [facility] ED 01/21/24 with several days of vomiting followed by an episode of severe tearing epigastric pain. Work-up revealed esophageal tear. Pt s/p esophageal stent with EGD 10/7. Attempted stent repositioning on 10/15 and was hypoxic, was intubated.  Reintubated 10/20-10/24. G tube placed 11/3. He accidentally pulled out the R chest tube on 11/7.L chest tube removed 11/11. Found to have superficial venous thrombosis of RUE 11/19. N&V with epigastric pain 11/28, found severe duodenitis on repeat CT. S/P EGD on 12/2. 12/10 re-intubated due to respiratory distress. CRRT 12/10-12/16. Extubated 12/12. PMHx: HFrEF, AFib, CAD, PE complicated by cardiogenic shock, CKD 4, MI, peripheral neuropathy, and TIA.    PT Comments  Patient progressing slowly, somewhat limited today due to fecal incontinence (will need to use brief next session) though still continued to walk back to room after seated rest.  Patient fatigued standing static for hygiene once back in room with SpO2 on RA down to 86% though back to baseline 88-89% after rest ~ 1 minute.  PTwill continue to follow while in the acute setting.     If plan is discharge home, recommend the following: Assistance with cooking/housework;Direct supervision/assist for medications management;Assist for transportation;Help with stairs or ramp for entrance;Two people to help with walking and/or transfers;Two people to help with bathing/dressing/bathroom;Assistance with feeding;Direct supervision/assist for financial management   Can travel by private vehicle        Equipment Recommendations  Rolling walker (2 wheels);BSC/3in1;Wheelchair (measurements PT);Wheelchair cushion (measurements PT);Hospital bed    Recommendations for Other Services       Precautions / Restrictions  Precautions Precautions: Fall Precaution/Restrictions Comments: PEG     Mobility  Bed Mobility Overal bed mobility: Needs Assistance Bed Mobility: Rolling, Sidelying to Sit Rolling: Min assist Sidelying to sit: Mod assist, Used rails       General bed mobility comments: assist for hand placement and lifting trunk    Transfers Overall transfer level: Needs assistance Equipment used: Rolling walker (2 wheels) Transfers: Sit to/from Stand Sit to Stand: Min assist, +2 safety/equipment           General transfer comment: stood from EOB and from recliner with A for balance pt count off and using momentum some    Ambulation/Gait Ambulation/Gait assistance: Min assist, +2 safety/equipment Gait Distance (Feet): 40 Feet (x 2) Assistive device: Rolling walker (2 wheels) Gait Pattern/deviations: Step-to pattern, Step-through pattern, Decreased stride length, Trunk flexed, Shuffle       General Gait Details: looking at the floor, cues for looking forward and pt states he cannot; limited distance due to fecal incontinence. stopped to sit and clean floor then walked back to room after pt turning around in standing in hallway with increased time and assist   Stairs             Wheelchair Mobility     Tilt Bed    Modified Rankin (Stroke Patients Only)       Balance Overall balance assessment: Needs assistance Sitting-balance support: No upper extremity supported Sitting balance-Leahy Scale: Fair     Standing balance support: Reliant on assistive device for balance, During functional activity, Bilateral upper extremity supported Standing balance-Leahy Scale: Poor Standing balance comment: Reliant on RW  Communication Communication Communication: Impaired Factors Affecting Communication: Hearing impaired  Cognition Arousal: Alert Behavior During Therapy: Anxious   PT - Cognitive impairments: Attention, Sequencing, Problem  solving                       PT - Cognition Comments: anxious making sure 2 people to support him, using commnication skills to manage his anticipation and verbalizing needs well, frustrated by fecal incontinence Following commands: Intact      Cueing Cueing Techniques: Verbal cues, Tactile cues  Exercises      General Comments General comments (skin integrity, edema, etc.): on RA SpO2 88-89% at rest, 86% after ambulation HR 109, a-fib, improved to 89% after pursed lip breathing 1 minute      Pertinent Vitals/Pain Pain Assessment Pain Assessment: No/denies pain    Home Living                          Prior Function            PT Goals (current goals can now be found in the care plan section) Progress towards PT goals: Progressing toward goals    Frequency    Min 2X/week      PT Plan      Co-evaluation              AM-PAC PT 6 Clicks Mobility   Outcome Measure  Help needed turning from your back to your side while in a flat bed without using bedrails?: A Little Help needed moving from lying on your back to sitting on the side of a flat bed without using bedrails?: A Lot Help needed moving to and from a bed to a chair (including a wheelchair)?: A Lot Help needed standing up from a chair using your arms (e.g., wheelchair or bedside chair)?: A Lot Help needed to walk in hospital room?: A Lot Help needed climbing 3-5 steps with a railing? : Total 6 Click Score: 12    End of Session Equipment Utilized During Treatment: Gait belt Activity Tolerance: Other (comment) (limited by incontinence) Patient left: with call bell/phone within reach;with chair alarm set Nurse Communication: Mobility status PT Visit Diagnosis: Other abnormalities of gait and mobility (R26.89);Muscle weakness (generalized) (M62.81);Other symptoms and signs involving the nervous system (R29.898);History of falling (Z91.81)     Time: 8941-8874 PT Time Calculation  (min) (ACUTE ONLY): 27 min  Charges:    $Gait Training: 8-22 mins $Therapeutic Activity: 8-22 mins PT General Charges $$ ACUTE PT VISIT: 1 Visit                     Micheline Portal, PT Acute Rehabilitation Services Office:[phone] 04/10/2024    Montie Portal 04/10/2024, 1:44 PM

## 2024-04-11 DIAGNOSIS — K223 Perforation of esophagus: Secondary | ICD-10-CM | POA: Diagnosis not present

## 2024-04-11 LAB — RENAL FUNCTION PANEL
Albumin: 2.4 g/dL — ABNORMAL LOW (ref 3.5–5.0)
Anion gap: 14 (ref 5–15)
BUN: 105 mg/dL — ABNORMAL HIGH (ref 8–23)
CO2: 24 mmol/L (ref 22–32)
Calcium: 8.2 mg/dL — ABNORMAL LOW (ref 8.9–10.3)
Chloride: 95 mmol/L — ABNORMAL LOW (ref 98–111)
Creatinine, Ser: 4.39 mg/dL — ABNORMAL HIGH (ref 0.61–1.24)
GFR, Estimated: 14 mL/min — ABNORMAL LOW
Glucose, Bld: 175 mg/dL — ABNORMAL HIGH (ref 70–99)
Phosphorus: 3.3 mg/dL (ref 2.5–4.6)
Potassium: 5.6 mmol/L — ABNORMAL HIGH (ref 3.5–5.1)
Sodium: 133 mmol/L — ABNORMAL LOW (ref 135–145)

## 2024-04-11 LAB — GLUCOSE, CAPILLARY
Glucose-Capillary: 164 mg/dL — ABNORMAL HIGH (ref 70–99)
Glucose-Capillary: 164 mg/dL — ABNORMAL HIGH (ref 70–99)
Glucose-Capillary: 168 mg/dL — ABNORMAL HIGH (ref 70–99)
Glucose-Capillary: 169 mg/dL — ABNORMAL HIGH (ref 70–99)
Glucose-Capillary: 184 mg/dL — ABNORMAL HIGH (ref 70–99)
Glucose-Capillary: 192 mg/dL — ABNORMAL HIGH (ref 70–99)
Glucose-Capillary: 215 mg/dL — ABNORMAL HIGH (ref 70–99)

## 2024-04-11 LAB — CBC
HCT: 22.3 % — ABNORMAL LOW (ref 39.0–52.0)
Hemoglobin: 7.3 g/dL — ABNORMAL LOW (ref 13.0–17.0)
MCH: 27.3 pg (ref 26.0–34.0)
MCHC: 32.7 g/dL (ref 30.0–36.0)
MCV: 83.5 fL (ref 80.0–100.0)
Platelets: 164 K/uL (ref 150–400)
RBC: 2.67 MIL/uL — ABNORMAL LOW (ref 4.22–5.81)
RDW: 17.3 % — ABNORMAL HIGH (ref 11.5–15.5)
WBC: 20.4 K/uL — ABNORMAL HIGH (ref 4.0–10.5)
nRBC: 0 % (ref 0.0–0.2)

## 2024-04-11 MED ORDER — HEPARIN (PORCINE) 25000 UT/250ML-% IV SOLN
1750.0000 [IU]/h | INTRAVENOUS | Status: DC
  Administered 2024-04-11: 1800 [IU]/h via INTRAVENOUS
  Administered 2024-04-13: 1700 [IU]/h via INTRAVENOUS
  Administered 2024-04-13: 1750 [IU]/h via INTRAVENOUS
  Filled 2024-04-11 (×5): qty 250
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
## 2024-04-11 NOTE — Progress Notes (Signed)
 [name] Billy [name] [id number] Admit--01/21/2024  1:34 PM    Code Status: Full Code   73 year old male Prolonged admission 3/10-07/20/18/2025 cardiac arrest requiring ICU management cardioverted for A-fib-found to have DVTs then had hematochezia necessitating IVC filter-recurrent GI bleed secondary to erosive gastropathy and friable duodenal mucosa and colonoscopy was deferred and then chest tube placements cardiac cath at that time showed CAD with CTO of RCA and was treated with diuretics and sent home on torsemide  Entresto  Jardiance -   Readmitted 10/5 with nausea vomiting sudden tearing sensation found to have an esophageal tear was given K-centra--see below plans   Chronology  10/7 esophageal stent placed by cardiothoracic 10/15 stent reposition 10/16 extubated 10/20 reintubated with ultrasound showing left-sided pleural effusion-left-sided chest tube placed-by lights criteria exudative effusion 10/23 echo 60-65% 10/24 barium swallow showed persistent leak 10/28 transferred out of ICU 11/3 PEG tube placed 11/19 swelling of right arm showing on ultrasound SVT cephalic vein 11/22 chest x-ray?  Pneumonia done because of back pain-follow-up CT scan shows stent in place no mediastinal fluid collection small bilateral pleural effusions with bibasilar atelectasis pneumonia not excluded-scattered clusters nodular densities predominantly right upper and right lower lobes with nodule 11 mm as previously seen?  Mets?  Infection?  Aspiration 11/26 stent removed. Esophagram negative for esophageal leak.  11/27 getting diuresis again 11/28 duodenitis/enteritis.  GI consulted. Bloody emesis added carafate   11/29 trialing clears 12/1 NV again w/ hematemesis. Lasix  held ---  esophagram performed showing patent esophagus without perforation irregularity mid esophagus?  Stent diffuse narrowing lumen distal duodenum and proximal jejunum 12/2 EGD ischemic ulcers. Impression:  - Congested, inflamed, nodular, texture  changed  mucosa in the esophagus. - Gastritis, Non-bleeding duodenal ulcers with a clean ulcer  base (Forrest Class III).- Non-bleeding jejunal ulcers with a clean ulcer   base (Forrest Class III)  - Findings suggestive of ischemic ulcers. Increased carafate  and cont BID PPI  12/3 full liq diet  12/4 marked increased WOB. Acute and progressive w/ increased hypoxia and HTN. PCCM asked to see  12/10 again increased WOB, stubborn to diuresis, unable to oxygenate, to ICU--- azotemia and renal consulted 12/11 Intubated  12/12 extubated 12/13 c/o of epigastric pain but suspect more anxiety component, on cleviprex , CRRT for volume removal 12/15 pain improved, on RA, on clev and CRRT, renal bx pending 12/16 renal biopsy performed showing 12/17 off clev transition to iHD, transfer to floor  12/18: TDC placed 12/19 [name] assumed care, has distended and painful abdomen today, KUB shows improvement so tube feeds resumed, Eliquis  resumed 12/22 hemoccult neg---transfuse 1 U--- Eliquis  had to be held in the setting 12/23 vascular consulted secondary to lower extremities--- sleepy this morning-x-ray shows congestion no pneumonia white count up-- 12/24 ABIs showed abnormal right and left toe brachial indices-no intervention from vascular required  PLAN    Iatrogenic volume overload 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 IHD with [facility] 12/18 placed Improved with Demadex  x1, HD---plans for long-term HD unclear--slightly hyperkalemic today with rising creatinine BUN see nephrology note from today prednisone  taper per nephrology EOT 04/28/2024-Notes indicate that may be weaning quicker?--Follow x-ray from today as per below  Nonspecific leukocytosis Chest x-ray more fluid to me than pneumonia, lactic acid 2.0-on Unasyn --CXR post HD tomorrow to see where we are  Boerhaave syndrome,--- events as above-- moderate to severe abdominal pain Ischemic ulcer disease--GI and  cardiothoracic aware-several CTs since 11/25---CT scan done 12/21 unremarkable-- Not eating soft diet at all --today seems to want  to attempt some pie-- diet tolerance limited p.o.-impulsive eater (per nutritionist eats too quickly)-prior severe Gastritis-continue PEG feeds-NO Carafate  with HD Last esophagram as above 12/1 Pantoprazole  40 IV twice daily, Pepcid  back to 20 daily See how he does with diet today ---his impulsivity makes graduating his diet challenging Severe Malnutrition-BMI 22 evidence of weight loss S/p PEG--ongoing as above  Normocytic anemia secondary to peptic ulcer disease,?  Ongoing gastritis as above Transfused x 1 in October another 03/29/2024 Transfuse 1 unit PRBC 12/23 Suspect ongoing microscopic losses secondary to severe gastritis--- if he drops again we will need a formal GI eval once again  Cardiac arrest 06/2023 with HF MR EF 55-65% 03/27/2024 A-fib RVR CHADVASC >4 on Eliquis  [cardioverted 06/2023] Eliquis  has been held since 12/22 --- would start Lovenox  and observe as he is also had need for transfusions as well RVR occurred at HD 12/21  cardiology consulted 12/21-patient continues Amio 200 twice daily --started Coreg  6.25 twice daily and intermittent A-fib noted Entresto  and torsemide  for ever held given current issues Amlodipine  held to facilitate rate control   DVT PE March 2025 does have IVC filter in place since March Anticoagulation resumed cautiously 12/25 ?  PAD with mottling of skin on removal of Unna boot from pictures in 12/22 note  Lower limb ischemia ruled out 12.23 ABI rules out ischemia-appreciate VVS--no intervention needed  Insomnia May continue trazodone  50-? Seroquel  to 12.5 at bedtime---? hydroxyzine  to 10 3 times daily anxiety  Diabetes CBG 160-190 -- Lantus  10U for coverage for now-every 4 very sensitive coverage  Multiple bedsores and wounds anterior right and left foot and injury stage I to bottom Continue Gerhardt twice daily  Lidoderm  patch for back pain---Bed sore last examined 12/22 and looks good Schedule Lomotil  1 tablet twice daily, can use as needed Lomotil  4 times daily in addition  Resolved/stable problems Petechial rash Hydralazine  and Unasyn  have been stopped, cannot rule out HSP-this seems unlikely to me however he has mottling in general over his skin-see the pictures from 12/22 of his feet-seem stable to me Acute hypoxic respiratory failure//Aspiration Pneumonia//Pleural effusion//Acute on chronic diastolic CHF exacerbation ?respiratory failure due to pneumonia, pleural effusion, CHF exacerbation -- Better c Lasix  and antibiotics. Intemrittently seems to have this Bilateral UE swelling--duplex upper extremities ruled out DVT Lung nodules-repeat CT in Jan/Feb window    subj   Seems a little sleepy today no distress Was going to eat some pie Having some diarrhea here and there but overall improved/stable no reports of dark or tarry stool No nausea no vomiting some mild abdominal pain not severe  Physical Exam: BP (!) 153/80 (BP Location: Left Arm)   Pulse (!) 104   Temp 98.2 F (36.8 C) (Oral)   Resp 18   Ht 6' 1 (1.854 m)   Wt 77.7 kg   SpO2 94%   BMI 22.60 kg/m   Chroncially ill apppearing, lying in bed,== flat affect Cta b no added sound no wheeze no rales no rhonchi Chest is clear posterolaterally with no wheeze to me S1-S2 no murmur no rub no gallop monitors reveal rate controlled A-fib/alternating sinus Abdomen is soft-slightly distended smiling umbilicus-without rebound or guarding PEG tube is in place Did not review the feet today   Data Reviewed: WBC 20.4 hemoglobin 7.3 platelet 164--sodium 133 potassium 5.6 BUN/creatinine 105/4.3  Billy Grimes, MD 04/11/2024 5:31 PM  For on call review [email or web address].

## 2024-04-11 NOTE — Progress Notes (Signed)
 [facility] Progress Note   Assessment/ Plan:   AKI on CKD 3b             - at baseline has L sided near occlusive renal artery  -s/p CRRT 12/10-12/16  - ANA +, C4 low, + hematuria, anti-histone Ab +. 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  He has completed rounds of antibiotics.  Mild interstitial fibrosis/tubular atrophy.  - Given concern for infection we provided steroids but avoided other IS medications due to weighing risks/benefits - on pred taper now with plans to wean quickly given the main concern is infection as a cause -he was on minoxidil  which caries similar risk for AI kidney phenomena as hydralazine . I stopped this and started amlodipine  (he had swelling previously so will monitor) -some signs of renal recovery with increasing urine output -did receive contrast on 12/21 which may hinder Crt improvement - Dialysis sessions have been cut short because of A-fib with RVR - Plan for dialysis tomorrow.  Could hold his dialysis session if his labs start to improve on their own.  Continue torsemide  to help with volume management.   Petechial rash;             - 2/2 Iga. Mgmt as above   Acute on chronic CHF exacerbation;             - seems to have more volume today. HD and diuretics as above   H/o DVT and PE:             - had RH strain previously             - on eliquis    Boorehave's syndrome             - s/p repair             - Has a PEG  - EGD 03/19/24 with ulcers  - PPI and pepcid    HTN  -stopped minoxidil . On BB and amlodipine  (edema in the past). Could consider ARB as Crt improves  Dispo: inpt  FYI -- Holiday Schedule for next week Usual MWF schedule -> Sun (21), Tues (23), Friday (26)     Subjective:   Patient has a hard time catching his breath today but otherwise denies  any complaints.   Objective:   BP (!) 153/80 (BP Location: Left Arm)   Pulse (!) 104   Temp 98.2 F (36.8 C) (Oral)   Resp 18   Ht 6' 1 (1.854 m)   Wt 77.7 kg   SpO2 94%   BMI 22.60 kg/m   Intake/Output Summary (Last 24 hours) at 04/11/2024 1024 Last data filed at 04/11/2024 0515 Gross per 24 hour  Intake --  Output 400 ml  Net -400 ml   Weight change:   Physical Exam: GEN: Up in bed with no distress PULM: Bilateral chest rise with no increased work of breathing CV: Tachycardic, no rub ABD: + PEG, mild distention EXT: Trace edema in the lower extremities, 1+ in the bilateral upper extremities SKIN: petechial nonblanching rash thighs, palms, arms NEURO: awake, alert, interactive Dialysis access: RIJ temp HD catheter  Imaging: VAS US  ABI WITH/WO TBI Result Date: 04/10/2024  LOWER EXTREMITY DOPPLER STUDY Patient Name:  Billy [name]  Date of Exam:   04/10/2024 Medical Rec #: [id number]   Accession #:    [id number] Date of  Birth: 07-15-1950   Patient Gender: M Patient Age:   73 years Exam Location:  [facility] Procedure:      VAS US  ABI WITH/WO TBI Referring Phys: NORMAN SERVE --------------------------------------------------------------------------------  Indications: Peripheral artery disease. High Risk Factors: Hypertension, hyperlipidemia.  Comparison Study: No prior studies. Performing Technologist: Gerome Ny RVT  Examination Guidelines: A complete evaluation includes at minimum, Doppler waveform signals and systolic blood pressure reading at the level of bilateral brachial, anterior tibial, and posterior tibial arteries, when vessel segments are accessible. Bilateral testing is considered an integral part of a complete examination. Photoelectric Plethysmograph (PPG) waveforms and toe systolic pressure readings are included as required and additional duplex testing as needed. Limited examinations for reoccurring indications may be performed as noted.  ABI Findings:  +---------+------------------+-----+-----------+----------+ Right    Rt Pressure (mmHg)IndexWaveform   Comment    +---------+------------------+-----+-----------+----------+ Brachial                                   Restricted +---------+------------------+-----+-----------+----------+ PTA      111               1.01 multiphasic           +---------+------------------+-----+-----------+----------+ DP       112               1.02 multiphasic           +---------+------------------+-----+-----------+----------+ Great Toe61                0.55                       +---------+------------------+-----+-----------+----------+ +---------+------------------+-----+-----------+-------+ Left     Lt Pressure (mmHg)IndexWaveform   Comment +---------+------------------+-----+-----------+-------+ Brachial 110                    triphasic          +---------+------------------+-----+-----------+-------+ PTA      109               0.99 multiphasic        +---------+------------------+-----+-----------+-------+ DP       104               0.95 multiphasic        +---------+------------------+-----+-----------+-------+ Great Toe73                0.66                    +---------+------------------+-----+-----------+-------+ +-------+-----------+-----------+------------+------------+ ABI/TBIToday's ABIToday's TBIPrevious ABIPrevious TBI +-------+-----------+-----------+------------+------------+ Right  1.02       0.55                                +-------+-----------+-----------+------------+------------+ Left   0.99       0.66                                +-------+-----------+-----------+------------+------------+  Summary: Right: Resting right ankle-brachial index is within normal range. The right toe-brachial index is abnormal.  Left: Resting left ankle-brachial index is within normal range. The left toe-brachial index is abnormal.  *See table(s)  above for measurements and observations.  Electronically signed by Debby Robertson on 04/10/2024 at 7:24:19 PM.    Final      Labs: BMET Recent Labs  Lab 04/05/24 0415 04/06/24 0900 04/07/24 0252 04/07/24 2250 04/08/24 0444 04/09/24 0424 04/10/24 0055  NA 134* 133* 134* 132* 132* 130* 130*  K 5.3* 4.4 4.7 4.8 4.5 5.2* 4.5  CL 99 96* 96* 95* 94* 94* 93*  CO2 21* 28 26 28 26 24 26   GLUCOSE 162* 147* 147* 105* 131* 128* 172*  BUN 101* 69* 96* 85* 89* 107* 73*  CREATININE 4.33* 2.92* 3.64* 3.20* 3.26* 4.12* 3.39*  CALCIUM  8.3* 8.0* 8.1* 7.9* 8.0* 8.0* 8.0*  PHOS 4.9* 3.7 4.4  --  3.7 3.2 2.8   CBC Recent Labs  Lab 04/08/24 0444 04/09/24 0424 04/10/24 0055 04/11/24 0322  WBC 16.1* 29.5* 23.7* 20.4*  HGB 7.1* 8.4* 7.5* 7.3*  HCT 21.8* 25.6* 23.1* 22.3*  MCV 82.9 84.2 84.9 83.5  PLT 197 195 151 164    Medications:     amiodarone   200 mg Per Tube BID   atorvastatin   80 mg Per Tube Daily   carvedilol   3.125 mg Per Tube BID WC   Chlorhexidine  Gluconate Cloth  6 each Topical Daily   clonazePAM   0.5 mg Per Tube BID   ezetimibe   10 mg Per Tube Daily   famotidine   20 mg Per Tube Daily   feeding supplement (PROSource TF20)  60 mL Per Tube BID   fiber  1 packet Per Tube BID   [name] butt cream   Topical BID   guaiFENesin   10 mL Per Tube Q4H   insulin  aspart  0-9 Units Subcutaneous Q4H   insulin  glargine  10 Units Subcutaneous Daily   lidocaine   1 patch Transdermal Q24H   multivitamin  1 tablet Per Tube QHS   pantoprazole  (PROTONIX ) IV  40 mg Intravenous Q12H   predniSONE   40 mg Per Tube Q breakfast   Followed by   NOREEN ON 04/14/2024] predniSONE   20 mg Oral Q breakfast   Followed by   NOREEN ON 04/21/2024] predniSONE   10 mg Oral Q breakfast   QUEtiapine   12.5 mg Per Tube QHS   saccharomyces boulardii  250 mg Per Tube BID   sodium chloride  flush  10-40 mL Intracatheter Q12H   torsemide   100 mg Oral Daily

## 2024-04-11 NOTE — TOC Progression Note (Addendum)
 Transition of Care [facility]) - Progression Note    Patient Details  Name: Billy [name] MRN: [id number] Date of Birth: 1950/08/30  [facility] CM/SW Contact  Inocente GORMAN Kindle, LCSW Phone Number: 04/11/2024, 10:44 AM  Clinical Narrative:    CSW continuing to follow for SNF placement at [facility] (will require insurance approval).    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, SNF Pending bed offer               Expected Discharge Plan and Services In-house Referral: Clinical Social Work Discharge Planning Services: [employer] Consult Post Acute Care Choice: Skilled Nursing Facility Living arrangements for the past 2 months: Single Family Home                                       Social Drivers of Health (SDOH) Interventions SDOH Screenings   Food Insecurity: No Food Insecurity (01/22/2024)  Housing: High Risk (01/22/2024)  Transportation Needs: No Transportation Needs (01/22/2024)  Utilities: Not At Risk (01/22/2024)  Social Connections: Socially Isolated (01/22/2024)  Tobacco Use: Low Risk (03/19/2024)    Readmission Risk Interventions    06/27/2023    1:52 PM 10/14/2022   11:29 AM  Readmission Risk Prevention Plan  Post Dischage Appt  Complete  Medication Screening  Complete  Transportation Screening Complete Complete  HRI or Home Care Consult Complete   Social Work Consult for Recovery Care Planning/Counseling Complete   Palliative Care Screening Not Applicable   Medication Review Oceanographer) Referral to Pharmacy

## 2024-04-11 NOTE — Plan of Care (Signed)
" °  Problem: Education: Goal: Knowledge of General Education information will improve Description: Including pain rating scale, medication(s)/side effects and non-pharmacologic comfort measures Outcome: Progressing   Problem: Health Behavior/Discharge Planning: Goal: Ability to manage health-related needs will improve Outcome: Progressing   Problem: Clinical Measurements: Goal: Will remain free from infection Outcome: Progressing Goal: Diagnostic test results will improve Outcome: Progressing Goal: Respiratory complications will improve Outcome: Progressing Goal: Cardiovascular complication will be avoided Outcome: Progressing   Problem: Activity: Goal: Risk for activity intolerance will decrease Outcome: Progressing   Problem: Nutrition: Goal: Adequate nutrition will be maintained Outcome: Progressing   Problem: Coping: Goal: Level of anxiety will decrease Outcome: Progressing   Problem: Elimination: Goal: Will not experience complications related to bowel motility Outcome: Progressing Goal: Will not experience complications related to urinary retention Outcome: Progressing   Problem: Pain Managment: Goal: General experience of comfort will improve and/or be controlled Outcome: Progressing   Problem: Safety: Goal: Ability to remain free from injury will improve Outcome: Progressing   Problem: Skin Integrity: Goal: Risk for impaired skin integrity will decrease Outcome: Progressing   Problem: Education: Goal: Ability to describe self-care measures that may prevent or decrease complications (Diabetes Survival Skills Education) will improve Outcome: Progressing   Problem: Coping: Goal: Ability to adjust to condition or change in health will improve Outcome: Progressing   Problem: Fluid Volume: Goal: Ability to maintain a balanced intake and output will improve Outcome: Progressing   Problem: Health Behavior/Discharge Planning: Goal: Ability to identify and  utilize available resources and services will improve Outcome: Progressing Goal: Ability to manage health-related needs will improve Outcome: Progressing   Problem: Metabolic: Goal: Ability to maintain appropriate glucose levels will improve Outcome: Progressing   Problem: Nutritional: Goal: Maintenance of adequate nutrition will improve Outcome: Progressing Goal: Progress toward achieving an optimal weight will improve Outcome: Progressing   Problem: Skin Integrity: Goal: Risk for impaired skin integrity will decrease Outcome: Progressing   Problem: Tissue Perfusion: Goal: Adequacy of tissue perfusion will improve Outcome: Progressing   Problem: Education: Goal: Ability to describe self-care measures that may prevent or decrease complications (Diabetes Survival Skills Education) will improve Outcome: Progressing Goal: Individualized Educational Video(s) Outcome: Progressing   Problem: Coping: Goal: Ability to adjust to condition or change in health will improve Outcome: Progressing   Problem: Fluid Volume: Goal: Ability to maintain a balanced intake and output will improve Outcome: Progressing   Problem: Health Behavior/Discharge Planning: Goal: Ability to identify and utilize available resources and services will improve Outcome: Progressing Goal: Ability to manage health-related needs will improve Outcome: Progressing   Problem: Metabolic: Goal: Ability to maintain appropriate glucose levels will improve Outcome: Progressing   Problem: Nutritional: Goal: Maintenance of adequate nutrition will improve Outcome: Progressing Goal: Progress toward achieving an optimal weight will improve Outcome: Progressing   Problem: Skin Integrity: Goal: Risk for impaired skin integrity will decrease Outcome: Progressing   Problem: Tissue Perfusion: Goal: Adequacy of tissue perfusion will improve Outcome: Progressing   "

## 2024-04-12 ENCOUNTER — Inpatient Hospital Stay (HOSPITAL_COMMUNITY)

## 2024-04-12 DIAGNOSIS — K223 Perforation of esophagus: Secondary | ICD-10-CM | POA: Diagnosis not present

## 2024-04-12 LAB — CBC
HCT: 23.4 % — ABNORMAL LOW (ref 39.0–52.0)
Hemoglobin: 7.6 g/dL — ABNORMAL LOW (ref 13.0–17.0)
MCH: 27.4 pg (ref 26.0–34.0)
MCHC: 32.5 g/dL (ref 30.0–36.0)
MCV: 84.5 fL (ref 80.0–100.0)
Platelets: 187 K/uL (ref 150–400)
RBC: 2.77 MIL/uL — ABNORMAL LOW (ref 4.22–5.81)
RDW: 17.5 % — ABNORMAL HIGH (ref 11.5–15.5)
WBC: 20.3 K/uL — ABNORMAL HIGH (ref 4.0–10.5)
nRBC: 0 % (ref 0.0–0.2)

## 2024-04-12 LAB — RENAL FUNCTION PANEL
Albumin: 2.2 g/dL — ABNORMAL LOW (ref 3.5–5.0)
Anion gap: 12 (ref 5–15)
BUN: 111 mg/dL — ABNORMAL HIGH (ref 8–23)
CO2: 27 mmol/L (ref 22–32)
Calcium: 8.1 mg/dL — ABNORMAL LOW (ref 8.9–10.3)
Chloride: 95 mmol/L — ABNORMAL LOW (ref 98–111)
Creatinine, Ser: 4.61 mg/dL — ABNORMAL HIGH (ref 0.61–1.24)
GFR, Estimated: 13 mL/min — ABNORMAL LOW
Glucose, Bld: 152 mg/dL — ABNORMAL HIGH (ref 70–99)
Phosphorus: 3.7 mg/dL (ref 2.5–4.6)
Potassium: 5.7 mmol/L — ABNORMAL HIGH (ref 3.5–5.1)
Sodium: 134 mmol/L — ABNORMAL LOW (ref 135–145)

## 2024-04-12 LAB — HEPARIN LEVEL (UNFRACTIONATED)
Heparin Unfractionated: 0.52 [IU]/mL (ref 0.30–0.70)
Heparin Unfractionated: 0.45 [IU]/mL (ref 0.30–0.70)

## 2024-04-12 LAB — GLUCOSE, CAPILLARY
Glucose-Capillary: 106 mg/dL — ABNORMAL HIGH (ref 70–99)
Glucose-Capillary: 127 mg/dL — ABNORMAL HIGH (ref 70–99)
Glucose-Capillary: 127 mg/dL — ABNORMAL HIGH (ref 70–99)
Glucose-Capillary: 163 mg/dL — ABNORMAL HIGH (ref 70–99)
Glucose-Capillary: 168 mg/dL — ABNORMAL HIGH (ref 70–99)
Glucose-Capillary: 179 mg/dL — ABNORMAL HIGH (ref 70–99)

## 2024-04-12 MED ORDER — FUROSEMIDE 10 MG/ML IJ SOLN
40.0000 mg | Freq: Once | INTRAMUSCULAR | Status: AC
  Administered 2024-04-12: 40 mg via INTRAVENOUS
  Filled 2024-04-12: qty 4

## 2024-04-12 MED ORDER — HYDROXYZINE HCL 25 MG PO TABS
25.0000 mg | ORAL_TABLET | Freq: Three times a day (TID) | ORAL | Status: DC | PRN
  Administered 2024-04-14 – 2024-04-15 (×4): 25 mg
  Filled 2024-04-12 (×2): qty 1

## 2024-04-12 MED ORDER — HEPARIN SODIUM (PORCINE) 1000 UNIT/ML IJ SOLN
INTRAMUSCULAR | Status: AC
  Filled 2024-04-12: qty 4

## 2024-04-12 MED ORDER — SODIUM ZIRCONIUM CYCLOSILICATE 10 G PO PACK
10.0000 g | PACK | Freq: Once | ORAL | Status: AC
  Administered 2024-04-12: 10 g via ORAL
  Filled 2024-04-12 (×2): qty 1

## 2024-04-12 MED ORDER — SODIUM CHLORIDE 0.9 % IV SOLN
3.0000 g | Freq: Every day | INTRAVENOUS | Status: DC
  Administered 2024-04-12: 3 g via INTRAVENOUS
  Filled 2024-04-12: qty 8

## 2024-04-12 MED ORDER — VITAL 1.5 CAL PO LIQD
1000.0000 mL | ORAL | Status: DC
  Administered 2024-04-13 – 2024-04-18 (×6): 1000 mL
  Filled 2024-04-12 (×4): qty 1000

## 2024-04-12 NOTE — Progress Notes (Signed)
 OT Cancellation Note  Patient Details Name: Billy [name] MRN: [id number] DOB: July 25, 1950   Cancelled Treatment:    Reason Eval/Treat Not Completed: Patient at procedure or test/ unavailable (HD)  Lucie JONETTA Kendall 04/12/2024, 1:19 PM

## 2024-04-12 NOTE — Progress Notes (Signed)
 Re-eval  Looks comfy on HHF, less Tachypnea- Increased hydroxyzine  for anxiety component  CCM/ Palliative to kindly follow  Jai Decarlo Rivet, MD [facility] 4:27 PM

## 2024-04-12 NOTE — Progress Notes (Signed)
 PHARMACY - ANTICOAGULATION CONSULT NOTE  Pharmacy Consult for Heparin  Indication: atrial fibrillation and DVT  Allergies[1]  Patient Measurements: Height: 6' 1 (185.4 cm) Weight: 78.4 kg (172 lb 13.5 oz) IBW/kg (Calculated) : 79.9 HEPARIN  DW (KG): 82.6  Vital Signs: Temp: 98.1 F (36.7 C) (12/26 1516) Temp Source: Oral (12/26 1516) BP: 135/56 (12/26 1516) Pulse Rate: 94 (12/26 1516)  Labs: Recent Labs    04/10/24 0055 04/11/24 0322 04/11/24 1231 04/12/24 0247 04/12/24 1830  HGB 7.5* 7.3*  --  7.6*  --   HCT 23.1* 22.3*  --  23.4*  --   PLT 151 164  --  187  --   HEPARINUNFRC  --   --   --  0.52 0.45  CREATININE 3.39*  --  4.39* 4.61*  --     Estimated Creatinine Clearance: 16.3 mL/min (A) (by C-G formula based on SCr of 4.61 mg/dL (H)).  Assessment: Billy [name] is a 73 y.o. year old male admitted on 01/21/2024 with concern for esophageal rupture. On eliquis  prior to admission for afib and hx DVT (last dose pta 12/9 PM). Pharmacy consulted to dose heparin  for afib with no bolus.    Will start heparin  drip at 1800 uts/hr given previously therapeutic on this dose during this admission. Hemoglobin low-7.3, will continue to monitor. Platelets stable at 164.    12/26 PM update:  Heparin  level in goal range at 0.45  Goal of Therapy:  Heparin  level 0.3-0.5 units/ml Monitor platelets by anticoagulation protocol: Yes   Plan:  Continue heparin  infusion at 1700 units/hr. Heparin  level and CBC in AM  Larraine Brazier, PharmD Clinical Pharmacist 04/12/2024  7:28 PM **Pharmacist phone directory can now be found on [email or web address] ([name]).  Listed under [facility].      [1]  Allergies Allergen Reactions   Hydralazine  Hcl     Concern for drug induced lupus   Norvasc  [Amlodipine ] Swelling and Other (See Comments)    Excessive BLE swelling Skin discoloration, blotching   Jardiance  [Empagliflozin ] Itching and Other (See Comments)    Patient mentioned penile swelling, pain    Morphine  And Codeine Itching    Opioid-induced pruritus

## 2024-04-12 NOTE — Progress Notes (Signed)
 PT Cancellation Note  Patient Details Name: Raffaele Derise MRN: [id number] DOB: 31-Jul-1950   Cancelled Treatment:    Reason Eval/Treat Not Completed: Patient at procedure or test/unavailable (Pt in HD emergently per chart. Pt had rapid response event early am. Will HOLD PT. Will f/u at later date.)   Stephane JULIANNA Bevel 04/12/2024, 8:05 AM Yisrael Obryan M,PT Acute Rehab Services [phone]

## 2024-04-12 NOTE — Significant Event (Signed)
 Rapid Response Event Note   Reason for Call :  Tachycardia-110s, Hypoxia-mid 80% on RA  Initial Focused Assessment:  Pt lying in bed with eyes open. His breathing is tachypneic and labored. He denies CP. Lungs with crackles t/o. ABD distended, soft, NT. Skin warm/diaphoretic. Pt is coughing up a small amount pink/red sputum.   T-97.7, HR-104, BP-153/71, RR-30s, SpO2-92% on NRB  Interventions:  EKG-AF RVR, Rightward axis, LVH with QRS widening and repolariztion abnormality, Anteroseptal infarct, age undetermined.  PCXR Lasix  40mg  IV Plan of Care:  Pt titrated to 15L HFNC, SpO2-93%. Allow lasix  time to work. HD called and pt will be dialyzed ASAP this AM. Heparin  gtt rate to be lowered d/t small amount bloody sputum-await pharmacy ordered. Please call RRT if further assistance needed.  Event Summary:   MD Notified: Dr. Cleatus  Call Upfz:[phone] Arrival Time:0540 End Upfz:[phone]  Tish Graeme Piety, RN

## 2024-04-12 NOTE — IPAL (Signed)
 Discussed in detail with Son [name] in detail:-I discussed  with him the last several days events--with vacillations and respiratory status heart rate punctuated by his underlying overall anxiety. I shared my concern that patient has been here close to 2 months and is unable to eat and is now on dialysis and has recurrent A-fib in the setting of respiratory failure ?  Patient is hospice/palliative eligible We discussed that I would see how today goes with the patient, reassess him after emergent HD today and make some decisions about goals of care and may be consult palliative care for complex decision making surrounding critical illness etc.----patient's son has discussed with the patient's wife concerns about patient ever leaving the hospital--- I shared that was my concern as well.  He understands completely.   I will reconvene with him tomorrow-depending on how the patient looks the son will be coming up to the hospital later today to discuss this with him as patient has underlying anxiety about all of this-he was previously no CODE BLUE but changed his CODE STATUS sometime during this hospital stay and then was intubated    > 25 minutes care coordination/palliative care time  Reggie Grimes, MD [facility] 10:08 AM

## 2024-04-12 NOTE — Progress Notes (Addendum)
 Billy [name] [id number] Admit--01/21/2024  1:34 PM    Code Status: Full Code   73 year old male Prolonged admission 3/10-07/20/18/2025 cardiac arrest requiring ICU management cardioverted for A-fib-found to have DVTs then had hematochezia necessitating IVC filter-recurrent GI bleed secondary to erosive gastropathy and friable duodenal mucosa and colonoscopy was deferred and then chest tube placements cardiac cath at that time showed CAD with CTO of RCA and was treated with diuretics and sent home on torsemide  Entresto  Jardiance -   Readmitted 10/5 with nausea vomiting sudden tearing sensation found to have an esophageal tear was given K-centra--see below plans   Chronology  10/7 esophageal stent placed by cardiothoracic 10/15 stent reposition 10/16 extubated 10/20 reintubated with ultrasound showing left-sided pleural effusion-left-sided chest tube placed-by lights criteria exudative effusion 10/23 echo 60-65% 10/24 barium swallow showed persistent leak 10/28 transferred out of ICU 11/3 PEG tube placed 11/19 swelling of right arm showing on ultrasound SVT cephalic vein 11/22 chest x-ray?  Pneumonia done because of back pain-follow-up CT scan shows stent in place no mediastinal fluid collection small bilateral pleural effusions with bibasilar atelectasis pneumonia not excluded-scattered clusters nodular densities predominantly right upper and right lower lobes with nodule 11 mm as previously seen?  Mets?  Infection?  Aspiration 11/26 stent removed. Esophagram negative for esophageal leak.  11/27 getting diuresis again 11/28 duodenitis/enteritis.  GI consulted. Bloody emesis added carafate   11/29 trialing clears 12/1 NV again w/ hematemesis. Lasix  held ---  esophagram performed showing patent esophagus without perforation irregularity mid esophagus?  Stent diffuse narrowing lumen distal duodenum and proximal jejunum 12/2 EGD ischemic ulcers. Impression:  - Congested, inflamed, nodular, texture  changed  mucosa in the esophagus. - Gastritis, Non-bleeding duodenal ulcers with a clean ulcer  base (Forrest Class III).- Non-bleeding jejunal ulcers with a clean ulcer   base (Forrest Class III)  - Findings suggestive of ischemic ulcers. Increased carafate  and cont BID PPI  12/3 full liq diet  12/4 marked increased WOB. Acute and progressive w/ increased hypoxia and HTN. PCCM asked to see  12/10 again increased WOB, stubborn to diuresis, unable to oxygenate, to ICU--- azotemia and renal consulted 12/11 Intubated  12/12 extubated 12/13 c/o of epigastric pain but suspect more anxiety component, on cleviprex , CRRT for volume removal 12/15 pain improved, on RA, on clev and CRRT, renal bx pending 12/16 renal biopsy performed showing 12/17 off clev transition to iHD, transfer to floor  12/18: TDC placed 12/19 Billy assumed care, has distended and painful abdomen today, KUB shows improvement so tube feeds resumed, Eliquis  resumed 12/22 hemoccult neg---transfuse 1 U--- Eliquis  had to be held in the setting 12/23 vascular consulted secondary to lower extremities--- sleepy this morning-x-ray shows congestion no pneumonia white count up-- 12/24 ABIs showed abnormal right and left toe brachial indices-no intervention from vascular required 12/26 severe respiratory distress needing nonrebreather etc.-A-fib RVR once again-x-ray  CRITICAL CARE Performed by: Colen Grimes   Total critical care time: 60 minutes, inclusive care coordination time with patient's son and discussion with critical care medicine who has been reconsulted  Critical care time was exclusive of separately billable procedures and treating other patients.  Critical care was necessary to treat or prevent imminent or life-threatening deterioration.  Critical care was time spent personally by me on the following activities: development of treatment plan with patient and/or surrogate as well as nursing, discussions with consultants,  evaluation of patient's response to treatment, examination of patient, obtaining history from patient or surrogate, ordering and performing treatments and interventions, ordering  and review of laboratory studies, ordering and review of radiographic studies, pulse oximetry and re-evaluation of patient's condition.   PLAN    Iatrogenic volume overload 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 as per renal not really candidate for alternate agents Hyperkalemia Lokelma  10 today labs tomorrow IHD with [facility] 12/18 placed Lasix  given 2 doses this morning then Demadex -emergent HD performed 3 L removal Still very short of breath sent to progressive unit critical care consulted-aim to keep dry  Nonspecific leukocytosis Chest x-ray this morning more concerning for fluid-see above discussion-continue Unasyn  EOT 12/29  Boerhaave syndrome,--- events as above-- moderate to severe abdominal pain Ischemic ulcer disease--GI and cardiothoracic aware-several CTs since 11/25---CT scan done 12/21 unremarkable-- N.p.o. given respiratory distress hold PEG feeds 24 hours NO Carafate  with HD, stop Maalox Last esophagram as above 12/1 Pantoprazole  40 IV twice daily, Pepcid  back to 20 daily Severe Malnutrition-BMI 22 evidence of weight loss S/p PEG  Normocytic anemia secondary to peptic ulcer disease,?  Ongoing gastritis as above Transfused x 1 in October another 03/29/2024 Transfuse 1 unit PRBC 12/23 Suspect ongoing microscopic losses secondary to severe gastritis--- if he drops again we will need a formal GI eval once again  Cardiac arrest 06/2023 with HF MR EF 55-65% 03/27/2024 A-fib RVR CHADVASC >4 on Eliquis  [cardioverted 06/2023]--- recurrent RVR at times Eliquis  has been held since 12/22 --- on heparin  now watch closely cardiology consulted 12/21-signed off-patient continues Amio 200 twice daily --continue Coreg  6.25 twice daily for  recurrent A-fib RVR RVR Entresto  and torsemide  forever held given current issues Amlodipine  held to facilitate rate control  DVT PE March 2025 does have IVC filter in place since March Anticoagulation resumed cautiously 12/25 ?  PAD with mottling of skin on removal of Unna boot from pictures in 12/22 note  Lower limb ischemia ruled out 12.23 ABI rules out ischemia-appreciate VVS--no intervention needed  Insomnia May continue trazodone  50-? Seroquel  to 12.5 at bedtime---? hydroxyzine  to 25 3 times daily given the anxiety  Diabetes CBG 108-160-only every 4 coverage  Multiple bedsores and wounds anterior right and left foot and injury stage I to bottom Continue Gerhardt twice daily Lidoderm  patch for back pain---Bed sore last examined 12/22 and looks good Schedule Lomotil  1 tablet twice daily, can use as needed Lomotil  4 times daily in addition  Resolved/stable problems Petechial rash Hydralazine  and Unasyn  have been stopped, cannot rule out HSP-this seems unlikely to me however he has mottling in general over his skin-see the pictures from 12/22 of his feet-seem stable to me Acute hypoxic respiratory failure//Aspiration Pneumonia//Pleural effusion//Acute on chronic diastolic CHF exacerbation ?respiratory failure due to pneumonia, pleural effusion, CHF exacerbation -- Better c Lasix  and antibiotics. Intemrittently seems to have this Bilateral UE swelling--duplex upper extremities ruled out DVT Lung nodules-repeat CT in Jan/Feb window    GLOBAL--I had a long discussion with him upstairs on the dialysis unit He reiterates to me that he wants to be a full code after discussion at the bedside with him-I had a long discussion as per iPal note with his son who is aware of current constraints and illness  subj   Events noted overnight at bedside this morning and went for emergent dialysis seen several times at HD he seems better at the end of dialysis and was sent to progressive unit No  chest pain no cough no chills seemed quite anxious No nausea no vomiting   Physical Exam: BP (!) 135/56 (BP Location:  Left Arm)   Pulse 94   Temp 98.1 F (36.7 C) (Oral)   Resp (!) 24   Ht 6' 1 (1.854 m)   Wt 78.4 kg   SpO2 91%   BMI 22.80 kg/m    Ill-appearing flat affect No JVD Had crackles this morning on exam and very elevated work of breathing which is now better Abdomen soft no distention no rebound no guarding Chronic right upper extremity swelling No lower extremity edema Power 5/5 coherent  Data Reviewed: Sodium 134 potassium 5.7 BUN/creatinine 111/4.6 GFR 13 WBC 20.3 hemoglobin 7.6 platelet 187 CXR worsening bilateral patchy confluent opacities RUL small bilateral pleural effusions pulmonary edema bilateral infection?  Jai-Gurmukh Kiriana Worthington, MD 04/12/2024 3:50 PM  For on call review [email or web address].
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
## 2024-04-12 NOTE — Progress Notes (Signed)
 [facility] Progress Note   Assessment/ Plan:   AKI on CKD 3b             - at baseline has L sided near occlusive renal artery  -s/p CRRT 12/10-12/16  - ANA +, C4 low, + hematuria, anti-histone Ab +. 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  He has completed rounds of antibiotics.  Mild interstitial fibrosis/tubular atrophy.  - Given concern for infection we provided steroids but avoided other IS medications due to weighing risks/benefits - on pred taper now with plans to wean quickly given the main concern is infection as a cause -he was on minoxidil  which caries similar risk for AI kidney phenomena as hydralazine . Stopped this (last dose 12/22) and started amlodipine  (he had swelling previously so will monitor) -some signs of renal recovery with increasing urine output -did receive contrast on 12/21 which may hinder Crt improvement - Dialysis sessions have been cut short because of A-fib with RVR and he is in afib RVR again this am, hypertensive as well - Plan for dialysis today.  Could hold his dialysis session if his labs start to improve on their own at some point but unfortunately not the case today.  Continue torsemide  to help with volume management.   Petechial rash;             - 2/2 Iga. Mgmt as above   Acute on chronic CHF exacerbation;             - seems to have more volume today. HD and diuretics as above   H/o DVT and PE:             - had RH strain previously             - on eliquis    Boorehave's syndrome             - s/p repair             - Has a PEG  - EGD 03/19/24 with ulcers  - PPI and pepcid    HTN  -stopped minoxidil . On BB and amlodipine  (edema in the past). Could consider ARB as Crt improves  Dispo: inpt   Subjective:   Patient still has a hard time catching his breath  today but otherwise denies any complaints. Afib RVR not helping.   Objective:   BP (!) 202/79   Pulse 95   Temp 97.7 F (36.5 C)   Resp 18   Ht 6' 1 (1.854 m)   Wt 77.7 kg   SpO2 90%   BMI 22.60 kg/m   Intake/Output Summary (Last 24 hours) at 04/12/2024 9247 Last data filed at 04/12/2024 9381 Gross per 24 hour  Intake --  Output 750 ml  Net -750 ml   Weight change:   Physical Exam: GEN: Up in bed with no distress PULM: Bilateral chest rise with no increased work of breathing CV: Tachycardic, no rub ABD: + PEG, mild distention EXT: Trace edema in the lower extremities, 1+ in the bilateral upper extremities SKIN: petechial nonblanching rash thighs, palms, arms NEURO: awake, alert, interactive Dialysis access: RIJ temp HD catheter  Imaging: DG Chest Port 1 View Result Date: 04/12/2024 EXAM: 1 VIEW(S) XRAY OF THE CHEST 04/12/2024 06:45:00 AM COMPARISON: 04/09/2024 CLINICAL HISTORY: 73 year old male with acute respiratory distress. FINDINGS: LINES, TUBES AND DEVICES: Left chest  cardiac loop recorder noted. Stable right IJ approach dual lumen catheter in place. Stable right PICC line in place. LUNGS AND PLEURA: Worsening patchy and confluent bilateral pulmonary opacity, most confluent in the right upper lobe. Small bilateral pleural effusions. No pneumothorax. HEART AND MEDIASTINUM: Left chest cardiac loop recorder noted. No acute abnormality of the cardiac and mediastinal silhouettes. BONES AND SOFT TISSUES: Paucity of bowel gas. No acute osseous abnormality. IMPRESSION: 1. Worsening bilateral patchy confluent opacities, greatest RUL. Small bilateral pleural effusions. Consider progressive pulmonary edema or bilateral infection. Electronically signed by: Helayne Hurst MD 04/12/2024 06:57 AM EST RP Workstation: [id number]   VAS US  ABI WITH/WO TBI Result Date: 04/10/2024  LOWER EXTREMITY DOPPLER STUDY Patient Name:  Billy [name]  Date of Exam:   04/10/2024 Medical Rec #: [id number]    Accession #:    [id number] Date of Birth: Apr 18, 1951   Patient Gender: M Patient Age:   73 years Exam Location:  [facility] Procedure:      VAS US  ABI WITH/WO TBI Referring Phys: NORMAN SERVE --------------------------------------------------------------------------------  Indications: Peripheral artery disease. High Risk Factors: Hypertension, hyperlipidemia.  Comparison Study: No prior studies. Performing Technologist: Gerome Ny RVT  Examination Guidelines: A complete evaluation includes at minimum, Doppler waveform signals and systolic blood pressure reading at the level of bilateral brachial, anterior tibial, and posterior tibial arteries, when vessel segments are accessible. Bilateral testing is considered an integral part of a complete examination. Photoelectric Plethysmograph (PPG) waveforms and toe systolic pressure readings are included as required and additional duplex testing as needed. Limited examinations for reoccurring indications may be performed as noted.  ABI Findings: +---------+------------------+-----+-----------+----------+ Right    Rt Pressure (mmHg)IndexWaveform   Comment    +---------+------------------+-----+-----------+----------+ Brachial                                   Restricted +---------+------------------+-----+-----------+----------+ PTA      111               1.01 multiphasic           +---------+------------------+-----+-----------+----------+ DP       112               1.02 multiphasic           +---------+------------------+-----+-----------+----------+ Great Toe61                0.55                       +---------+------------------+-----+-----------+----------+ +---------+------------------+-----+-----------+-------+ Left     Lt Pressure (mmHg)IndexWaveform   Comment +---------+------------------+-----+-----------+-------+ Brachial 110                    triphasic           +---------+------------------+-----+-----------+-------+ PTA      109               0.99 multiphasic        +---------+------------------+-----+-----------+-------+ DP       104               0.95 multiphasic        +---------+------------------+-----+-----------+-------+ Great Toe73                0.66                    +---------+------------------+-----+-----------+-------+ +-------+-----------+-----------+------------+------------+ ABI/TBIToday's ABIToday's TBIPrevious ABIPrevious TBI +-------+-----------+-----------+------------+------------+ Right  1.02  0.55                                +-------+-----------+-----------+------------+------------+ Left   0.99       0.66                                +-------+-----------+-----------+------------+------------+  Summary: Right: Resting right ankle-brachial index is within normal range. The right toe-brachial index is abnormal.  Left: Resting left ankle-brachial index is within normal range. The left toe-brachial index is abnormal.  *See table(s) above for measurements and observations.  Electronically signed by Debby Robertson on 04/10/2024 at 7:24:19 PM.    Final      Labs: BMET Recent Labs  Lab 04/06/24 0900 04/07/24 0252 04/07/24 2250 04/08/24 0444 04/09/24 0424 04/10/24 0055 04/11/24 1231 04/12/24 0247  NA 133* 134* 132* 132* 130* 130* 133* 134*  K 4.4 4.7 4.8 4.5 5.2* 4.5 5.6* 5.7*  CL 96* 96* 95* 94* 94* 93* 95* 95*  CO2 28 26 28 26 24 26 24 27   GLUCOSE 147* 147* 105* 131* 128* 172* 175* 152*  BUN 69* 96* 85* 89* 107* 73* 105* 111*  CREATININE 2.92* 3.64* 3.20* 3.26* 4.12* 3.39* 4.39* 4.61*  CALCIUM  8.0* 8.1* 7.9* 8.0* 8.0* 8.0* 8.2* 8.1*  PHOS 3.7 4.4  --  3.7 3.2 2.8 3.3 3.7   CBC Recent Labs  Lab 04/09/24 0424 04/10/24 0055 04/11/24 0322 04/12/24 0247  WBC 29.5* 23.7* 20.4* 20.3*  HGB 8.4* 7.5* 7.3* 7.6*  HCT 25.6* 23.1* 22.3* 23.4*  MCV 84.2 84.9 83.5 84.5  PLT 195 151 164  187    Medications:     amiodarone   200 mg Per Tube BID   atorvastatin   80 mg Per Tube Daily   carvedilol   3.125 mg Per Tube BID WC   Chlorhexidine  Gluconate Cloth  6 each Topical Daily   clonazePAM   0.5 mg Per Tube BID   ezetimibe   10 mg Per Tube Daily   famotidine   20 mg Per Tube Daily   feeding supplement (PROSource TF20)  60 mL Per Tube BID   fiber  1 packet Per Tube BID   [name] butt cream   Topical BID   guaiFENesin   10 mL Per Tube Q4H   insulin  aspart  0-9 Units Subcutaneous Q4H   insulin  glargine  10 Units Subcutaneous Daily   lidocaine   1 patch Transdermal Q24H   multivitamin  1 tablet Per Tube QHS   pantoprazole  (PROTONIX ) IV  40 mg Intravenous Q12H   predniSONE   40 mg Per Tube Q breakfast   Followed by   NOREEN ON 04/14/2024] predniSONE   20 mg Oral Q breakfast   Followed by   NOREEN ON 04/21/2024] predniSONE   10 mg Oral Q breakfast   QUEtiapine   12.5 mg Per Tube QHS   saccharomyces boulardii  250 mg Per Tube BID   sodium chloride  flush  10-40 mL Intracatheter Q12H   torsemide   100 mg Oral Daily

## 2024-04-13 ENCOUNTER — Inpatient Hospital Stay (HOSPITAL_COMMUNITY)

## 2024-04-13 DIAGNOSIS — D72829 Elevated white blood cell count, unspecified: Secondary | ICD-10-CM | POA: Diagnosis not present

## 2024-04-13 DIAGNOSIS — Z86718 Personal history of other venous thrombosis and embolism: Secondary | ICD-10-CM | POA: Diagnosis not present

## 2024-04-13 DIAGNOSIS — K223 Perforation of esophagus: Secondary | ICD-10-CM | POA: Diagnosis not present

## 2024-04-13 DIAGNOSIS — J9620 Acute and chronic respiratory failure, unspecified whether with hypoxia or hypercapnia: Secondary | ICD-10-CM

## 2024-04-13 DIAGNOSIS — Z86711 Personal history of pulmonary embolism: Secondary | ICD-10-CM | POA: Diagnosis not present

## 2024-04-13 DIAGNOSIS — I4891 Unspecified atrial fibrillation: Secondary | ICD-10-CM | POA: Diagnosis not present

## 2024-04-13 DIAGNOSIS — J811 Chronic pulmonary edema: Secondary | ICD-10-CM

## 2024-04-13 LAB — GLUCOSE, CAPILLARY
Glucose-Capillary: 115 mg/dL — ABNORMAL HIGH (ref 70–99)
Glucose-Capillary: 121 mg/dL — ABNORMAL HIGH (ref 70–99)
Glucose-Capillary: 151 mg/dL — ABNORMAL HIGH (ref 70–99)
Glucose-Capillary: 161 mg/dL — ABNORMAL HIGH (ref 70–99)
Glucose-Capillary: 184 mg/dL — ABNORMAL HIGH (ref 70–99)
Glucose-Capillary: 95 mg/dL (ref 70–99)

## 2024-04-13 LAB — CBC
HCT: 21.7 % — ABNORMAL LOW (ref 39.0–52.0)
Hemoglobin: 7 g/dL — ABNORMAL LOW (ref 13.0–17.0)
MCH: 26.5 pg (ref 26.0–34.0)
MCHC: 32.3 g/dL (ref 30.0–36.0)
MCV: 82.2 fL (ref 80.0–100.0)
Platelets: 172 K/uL (ref 150–400)
RBC: 2.64 MIL/uL — ABNORMAL LOW (ref 4.22–5.81)
RDW: 17.2 % — ABNORMAL HIGH (ref 11.5–15.5)
WBC: 15 K/uL — ABNORMAL HIGH (ref 4.0–10.5)
nRBC: 0 % (ref 0.0–0.2)

## 2024-04-13 LAB — BLOOD GAS, ARTERIAL
Acid-Base Excess: 5.5 mmol/L — ABNORMAL HIGH (ref 0.0–2.0)
Bicarbonate: 28.9 mmol/L — ABNORMAL HIGH (ref 20.0–28.0)
O2 Saturation: 95.1 %
Patient temperature: 37.9
pCO2 arterial: 38 mmHg (ref 32–48)
pH, Arterial: 7.49 — ABNORMAL HIGH (ref 7.35–7.45)
pO2, Arterial: 69 mmHg — ABNORMAL LOW (ref 83–108)

## 2024-04-13 LAB — RENAL FUNCTION PANEL
Albumin: 2 g/dL — ABNORMAL LOW (ref 3.5–5.0)
Anion gap: 11 (ref 5–15)
BUN: 80 mg/dL — ABNORMAL HIGH (ref 8–23)
CO2: 28 mmol/L (ref 22–32)
Calcium: 7.8 mg/dL — ABNORMAL LOW (ref 8.9–10.3)
Chloride: 93 mmol/L — ABNORMAL LOW (ref 98–111)
Creatinine, Ser: 3.37 mg/dL — ABNORMAL HIGH (ref 0.61–1.24)
GFR, Estimated: 19 mL/min — ABNORMAL LOW
Glucose, Bld: 88 mg/dL (ref 70–99)
Phosphorus: 4.2 mg/dL (ref 2.5–4.6)
Potassium: 4.8 mmol/L (ref 3.5–5.1)
Sodium: 131 mmol/L — ABNORMAL LOW (ref 135–145)

## 2024-04-13 LAB — HEPARIN LEVEL (UNFRACTIONATED)
Heparin Unfractionated: >1.1 [IU]/mL — ABNORMAL HIGH (ref 0.30–0.70)
Heparin Unfractionated: 0.32 [IU]/mL (ref 0.30–0.70)

## 2024-04-13 LAB — APTT: aPTT: 66 s — ABNORMAL HIGH (ref 24–36)

## 2024-04-13 MED ORDER — HEPARIN SODIUM (PORCINE) 1000 UNIT/ML IJ SOLN
INTRAMUSCULAR | Status: AC
  Filled 2024-04-13: qty 4

## 2024-04-13 MED ORDER — HEPARIN SODIUM (PORCINE) 1000 UNIT/ML IJ SOLN
3800.0000 [IU] | Freq: Once | INTRAMUSCULAR | Status: AC
  Administered 2024-04-13: 3800 [IU]

## 2024-04-13 MED ORDER — SODIUM CHLORIDE 0.9 % IV SOLN
3.0000 g | Freq: Every day | INTRAVENOUS | Status: AC
  Administered 2024-04-13 – 2024-04-14 (×2): 3 g via INTRAVENOUS
  Filled 2024-04-13 (×2): qty 8

## 2024-04-13 NOTE — Progress Notes (Signed)
 There was consult for DBIV by pharmacist. Talked to charge nurse regarding this matter. This unit can do DBIV themselves. Charge nurse will do. HS [employer]

## 2024-04-13 NOTE — Plan of Care (Signed)
" °  Problem: Education: Goal: Knowledge of General Education information will improve Description: Including pain rating scale, medication(s)/side effects and non-pharmacologic comfort measures Outcome: Progressing   Problem: Clinical Measurements: Goal: Will remain free from infection Outcome: Progressing   Problem: Clinical Measurements: Goal: Diagnostic test results will improve Outcome: Progressing   Problem: Clinical Measurements: Goal: Respiratory complications will improve Outcome: Progressing   Problem: Nutrition: Goal: Adequate nutrition will be maintained Outcome: Progressing   Problem: Coping: Goal: Level of anxiety will decrease Outcome: Progressing   "

## 2024-04-13 NOTE — Progress Notes (Signed)
 Billy [name] [id number] Admit--01/21/2024  1:34 PM    Code Status: Full Code   73 year old male Prolonged admission 3/10-07/20/18/2025 cardiac arrest requiring ICU management cardioverted for A-fib-found to have DVTs then had hematochezia necessitating IVC filter-recurrent GI bleed secondary to erosive gastropathy and friable duodenal mucosa and colonoscopy was deferred and then chest tube placements cardiac cath at that time showed CAD with CTO of RCA and was treated with diuretics and sent home on torsemide  Entresto  Jardiance -   Readmitted 10/5 with nausea vomiting sudden tearing sensation found to have an esophageal tear was given K-centra--see below plans   Chronology  10/7 esophageal stent placed by cardiothoracic 10/15 stent reposition 10/16 extubated 10/20 reintubated with ultrasound showing left-sided pleural effusion-left-sided chest tube placed-by lights criteria exudative effusion 10/23 echo 60-65% 10/24 barium swallow showed persistent leak 10/28 transferred out of ICU 11/3 PEG tube placed 11/19 swelling of right arm showing on ultrasound SVT cephalic vein 11/22 chest x-ray?  Pneumonia done because of back pain-follow-up CT scan shows stent in place no mediastinal fluid collection small bilateral pleural effusions with bibasilar atelectasis pneumonia not excluded-scattered clusters nodular densities predominantly right upper and right lower lobes with nodule 11 mm as previously seen?  Mets?  Infection?  Aspiration 11/26 stent removed. Esophagram negative for esophageal leak.  11/27 getting diuresis again 11/28 duodenitis/enteritis.  GI consulted. Bloody emesis added carafate   11/29 trialing clears 12/1 NV again w/ hematemesis. Lasix  held ---  esophagram performed showing patent esophagus without perforation irregularity mid esophagus?  Stent diffuse narrowing lumen distal duodenum and proximal jejunum 12/2 EGD ischemic ulcers. Impression:  - Congested, inflamed, nodular, texture  changed  mucosa in the esophagus. - Gastritis, Non-bleeding duodenal ulcers with a clean ulcer  base (Forrest Class III).- Non-bleeding jejunal ulcers with a clean ulcer   base (Forrest Class III)  - Findings suggestive of ischemic ulcers. Increased carafate  and cont BID PPI  12/3 full liq diet  12/4 marked increased WOB. Acute and progressive w/ increased hypoxia and HTN. PCCM asked to see  12/10 again increased WOB, stubborn to diuresis, unable to oxygenate, to ICU--- azotemia and renal consulted 12/11 Intubated  12/12 extubated 12/13 c/o of epigastric pain but suspect more anxiety component, on cleviprex , CRRT for volume removal 12/15 pain improved, on RA, on clev and CRRT, renal bx pending 12/16 renal biopsy performed showing 12/17 off clev transition to iHD, transfer to floor  12/18: TDC placed 12/19 Billy assumed care, has distended and painful abdomen today, KUB shows improvement so tube feeds resumed, Eliquis  resumed 12/22 hemoccult neg---transfuse 1 U--- Eliquis  had to be held in the setting 12/23 vascular consulted secondary to lower extremities--- sleepy this morning-x-ray shows congestion no pneumonia white count up-- 12/24 ABIs showed abnormal right and left toe brachial indices-no intervention from vascular required 12/26 severe respiratory distress needing nonrebreather etc.-A-fib RVR once again-x-ray  PLAN    Iatrogenic volume overload 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 as per renal not really candidate for alternate agents Hyperkalemia Stop Lokelma  defer further management hyperkalemia to renal IHD with [facility] 12/18 placed Note events from 12/26-transferred to progressive unit heated high flow after dialysis yesterday and still quite volume overloaded-do not feel strongly this is pneumonia-Will need to see how he does over the next several days-palliative consult is pending --appreciate critical care  seeing him  Nonspecific leukocytosis Could be from steroids-pulmonary does not feel that this is infectious-stop antibiotics 12/28 [5 days ]and observe if  still leukocytotic Watch closely  Boerhaave syndrome,--- events as above-- moderate to severe abdominal pain Ischemic ulcer disease--GI and cardiothoracic aware-several CTs since 11/25---CT scan done 12/21 unremarkable-- N.p.o. given respiratory distress hold PEG feeds indefinitely until patient respiratory status improves NO Carafate  with HD, stop Maalox Last esophagram as above 12/1 Pantoprazole  40 IV twice daily, Pepcid  20 daily Severe Malnutrition-BMI 22 evidence of weight loss S/p PEG  Normocytic anemia secondary to peptic ulcer disease,?  Ongoing gastritis as above Transfused x 1 in October another 03/29/2024 Transfuse 1 unit PRBC 12/23 Suspect ongoing microscopic losses secondary to severe gastritis--- please call GI back if needing more blood----there is low likelihood he is stable for scope.  Cardiac arrest 06/2023 with HF MR EF 55-65% 03/27/2024 A-fib RVR CHADVASC >4 on Eliquis  [cardioverted 06/2023]--- recurrent RVR at times Eliquis  has been held since 12/22 --- on heparin  now watch closely cardiology consulted 12/21-signed off-patient continues Amio 200 twice daily --continue Coreg  6.25 twice daily for recurrent A-fib RVR RVR Entresto  and torsemide  forever held given current issues Amlodipine  held to facilitate rate control  DVT PE March 2025 does have IVC filter in place since March Anticoagulation resumed cautiously 12/25 ?  PAD with mottling of skin on removal of Unna boot from pictures in 12/22 note  Lower limb ischemia ruled out 12.23 ABI rules out ischemia-appreciate VVS--no intervention needed  Insomnia May continue trazodone  50-? Seroquel  to 12.5 at bedtime---? hydroxyzine  to 25 3 times daily given aninxety  Diabetes CBG 108-160-only every 4 coverage  Multiple bedsores and wounds anterior right and left  foot and injury stage I to bottom Continue Gerhardt twice daily Lidoderm  patch for back pain---Bed sore last examined 12/22 and looks good Schedule Lomotil  1 tablet twice daily, can use as needed Lomotil  4 times daily in addition  Resolved/stable problems Petechial rash Hydralazine  and Unasyn  have been stopped, cannot rule out HSP-this seems unlikely to me however he has mottling in general over his skin-see the pictures from 12/22 of his feet-seem stable to me Acute hypoxic respiratory failure//Aspiration Pneumonia//Pleural effusion//Acute on chronic diastolic CHF exacerbation ?respiratory failure due to pneumonia, pleural effusion, CHF exacerbation -- Better c Lasix  and antibiotics. Intemrittently seems to have this Bilateral UE swelling--duplex upper extremities ruled out DVT Lung nodules-repeat CT in Jan/Feb window    GLOBAL--I had a long discussion with him upstairs on the dialysis unit He reiterates to me that he wants to be a full code after discussion at the bedside with him- subj   More stable Still HHF ~ 60 % this am No CP No fever Sat down with him ---he tells me he is firm in decisions about doing everything   No n/v Still NPO Gas this am shows alkalosis   Physical Exam: BP 108/61   Pulse 98   Temp (!) 97.4 F (36.3 C) (Axillary)   Resp (!) 22   Ht 6' 1 (1.854 m)   Wt 77.4 kg   SpO2 96%   BMI 22.51 kg/m    Ill-appearing flat affect Less crackels to lungs Chronic right upper extremity swelling No lower extremity edema Power 5/5 coherent  Data Reviewed:    Latest Ref Rng & Units 04/13/2024    5:20 AM 04/12/2024    2:47 AM 04/11/2024   12:31 PM  CMP  Glucose 70 - 99 mg/dL 88  847  824   BUN 8 - 23 mg/dL 80  888  894   Creatinine 0.61 - 1.24 mg/dL 6.62  5.38  5.60  Sodium 135 - 145 mmol/L 131  134  133   Potassium 3.5 - 5.1 mmol/L 4.8  5.7  5.6   Chloride 98 - 111 mmol/L 93  95  95   CO2 22 - 32 mmol/L 28  27  24    Calcium  8.9 - 10.3 mg/dL 7.8   8.1  8.2    arterial blood gases CBC    Component Value Date/Time   WBC 15.0 (H) 04/13/2024 0520   RBC 2.64 (L) 04/13/2024 0520   HGB 7.0 (L) 04/13/2024 0520   HGB 12.0 (L) 09/27/2023 0846   HCT 21.7 (L) 04/13/2024 0520   HCT 40.5 09/27/2023 0846   PLT 172 04/13/2024 0520   PLT 238 09/27/2023 0846   MCV 82.2 04/13/2024 0520   MCV 88 09/27/2023 0846   MCH 26.5 04/13/2024 0520   MCHC 32.3 04/13/2024 0520   RDW 17.2 (H) 04/13/2024 0520   RDW 17.3 (H) 09/27/2023 0846   LYMPHSABS 0.5 (L) 03/27/2024 0500   MONOABS 0.9 03/27/2024 0500   EOSABS 0.1 03/27/2024 0500   BASOSABS 0.0 03/27/2024 0500     Colen Grimes, MD 04/13/2024 5:39 PM  For on call review [email or web address].

## 2024-04-13 NOTE — Progress Notes (Signed)
 "  NAME:  Billy [name], MRN:  [id number], DOB:  12/10/1950, LOS: 83 ADMISSION DATE:  01/21/2024, CONSULTATION DATE: 04/12/2024 REFERRING MD: Royal , CHIEF COMPLAINT: SOB  History of Present Illness:  73 year old male with a history cardiac arrest, Boerhaave syndrome ,esophageal stent, respiratory failure, IVC filter for recurrent GI bleed, ESRD on HD, this a.m. noted to be hypoxic, and hypertensive requiring urgent hemodialysis Critical care team was consulted for evaluation  Pertinent  Medical History  Cardiac arrest, Boerhaave syndrome, respiratory failure, IVC filter, recurrent GI bleed  Significant Hospital Events: Including procedures, antibiotic start and stop dates in addition to other pertinent events   10/7 esophageal stent placed by cardiothoracic 10/15 stent reposition 10/16 extubated 10/20 reintubated with ultrasound showing left-sided pleural effusion-left-sided chest tube placed-by lights criteria exudative effusion 10/23 echo 60-65% 10/24 barium swallow showed persistent leak 10/28 transferred out of ICU 11/3 PEG tube placed 11/19 swelling of right arm showing on ultrasound SVT cephalic vein 11/22 chest x-ray?  Pneumonia done because of back pain-follow-up CT scan shows stent in place no mediastinal fluid collection small bilateral pleural effusions with bibasilar atelectasis pneumonia not excluded-scattered clusters nodular densities predominantly right upper and right lower lobes with nodule 11 mm as previously seen?  Mets?  Infection?  Aspiration 11/26 stent removed. Esophagram negative for esophageal leak.  11/27 getting diuresis again 11/28 duodenitis/enteritis.  GI consulted. Bloody emesis added carafate   11/29 trialing clears 12/1 NV again w/ hematemesis. Lasix  held ---  esophagram performed showing patent esophagus without perforation irregularity mid esophagus?  Stent diffuse narrowing lumen distal duodenum and proximal jejunum 12/2 EGD ischemic ulcers. Impression:   - Congested, inflamed, nodular, texture changed  mucosa in the esophagus. - Gastritis, Non-bleeding duodenal ulcers with a clean ulcer  base (Forrest Class III).- Non-bleeding jejunal ulcers with a clean ulcer   base (Forrest Class III)  - Findings suggestive of ischemic ulcers. Increased carafate  and cont BID PPI  12/3 full liq diet  12/4 marked increased WOB. Acute and progressive w/ increased hypoxia and HTN. PCCM asked to see  12/10 again increased WOB, stubborn to diuresis, unable to oxygenate, to ICU--- azotemia and renal consulted 12/11 Intubated  12/12 extubated 12/13 c/o of epigastric pain but suspect more anxiety component, on cleviprex , CRRT for volume removal 12/15 pain improved, on RA, on clev and CRRT, renal bx pending 12/16 renal biopsy performed showing 12/17 off clev transition to iHD, transfer to floor  12/18: TDC placed 12/19 [name] assumed care, has distended and painful abdomen today, KUB shows improvement so tube feeds resumed, Eliquis  resumed 12/22 hemoccult neg---transfuse 1 U--- Eliquis  had to be held in the setting 12/23 vascular consulted secondary to lower extremities--- sleepy this morning-x-ray shows congestion no pneumonia white count up-- 12/24 ABIs showed abnormal right and left toe brachial indices-no intervention from vascular required 12/26 severe respiratory distress needing nonrebreather etc.-A-fib RVR once again-x-ray  Interim History / Subjective:  He feels like he is breathing better compared to yesterday but oxygen  requirements have increased some  Objective    Blood pressure (!) 137/54, pulse (!) 103, temperature 98 F (36.7 C), temperature source Oral, resp. rate (!) 30, height 6' 1 (1.854 m), weight 79.8 kg, SpO2 96%.    FiO2 (%):  [45 %-60 %] 60 %   Intake/Output Summary (Last 24 hours) at 04/13/2024 1125 Last data filed at 04/12/2024 1214 Gross per 24 hour  Intake --  Output 3000 ml  Net -3000 ml   Filed Weights   04/12/24 0816  04/12/24 1214 04/13/24 0300  Weight: 81.3 kg 78.4 kg 79.8 kg    Examination: General Frail 73 year old male patient chronically ill after prolonged hospital stay currently in no acute distress however HEENT normocephalic atraumatic mucous membranes are dry neck veins currently flat Pulmonary diffuse scattered rales, speaks in short sentences.  Still with exertional dyspnea.  Current oxygen  requirements 60% FiO2 on 25 L flow portable chest x-ray: Continues to demonstrate bilateral pulmonary edema Cardiac irregular irregular Abdomen soft nontender PEG site is unremarkable Extremities warm dry with dependent edema pulses are palpable Neuro awake oriented no focal deficits not anxious this morning Resolved problem list   Assessment and Plan   Acute on chronic respiratory failure Fluid overload Volume mediated hypertensive pulmonary edema-improving with IHD HD dependent History of Boerhaave syndrome s/p stent History of recurrent GI bleed Nonspecific leukocytosis Normocytic anemia A-fib with RVR History of cardiac arrest DVT PE-IVC filter for recurrent GI bleed  Acute on chronic respiratory failure secondary to acute pulmonary edema, secondary to hypertensive crisis and volume overload,   Discussion  73 year old male with history of Boerhaave syndrome s/p stent, respiratory failure, IVC filter for recurrent GI bleed, ESRD on HD, on the a.m. of 12/26 complaining of shortness of breath, was hypoxic, hypertensive and required urgent hemodialysis - Status post emergent dialysis on 12/26 - Supplemental oxygen  requirements: 60% on 25 L - Chest x-ray still with significant volume overload, however it does appear as though hypertension has improved  Plan Cont supplemental oxygen  for pulse oximetry greater than 90% Okay to intermittently alternate with NIPPV He will need more volume removal, would recommend dialysis again today, I have spoke with nephrology and firmed dialysis for  today Agree with ongoing goals of care discussion Reasonable to continue Unasyn  but I am not convinced he is infected                   "

## 2024-04-13 NOTE — Progress Notes (Signed)
 Patient desaturated to 80 while on 45% FIO2 on HHFNC, patient increased to 50% and currently tolerated well. Patient is unable to be weaned at this time due to current oxygen  demand.

## 2024-04-13 NOTE — Progress Notes (Signed)
 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  He has completed rounds of antibiotics.  Mild interstitial fibrosis/tubular atrophy.  - Given concern for infection we provided steroids but avoided other IS medications due to weighing risks/benefits - on pred taper now with plans to wean quickly given the main concern is infection as a cause -he was on minoxidil  which caries similar risk for AI kidney phenomena as hydralazine . Stopped this (last dose 12/22) and started amlodipine  (he had swelling previously so will monitor) -some signs of renal recovery with increasing urine output -did receive contrast on 12/21 which may hinder Crt improvement - Dialysis sessions have been cut short because of A-fib with RVR and he is in afib RVR again this am, hypertensive as well - Plan for dialysis again today as he still appears overloaded; able to UF 3L on Fri.  Continue torsemide  to help with volume management.   Petechial rash;             - 2/2 Iga. Mgmt as above   Acute on chronic CHF exacerbation;             - seems to have more volume today. HD and diuretics as above   H/o DVT and PE:             - had RH strain previously             - on eliquis    Boorehave's syndrome             - s/p repair             - Has a PEG  - EGD 03/19/24 with ulcers  - PPI and pepcid    HTN  -stopped minoxidil . On BB and amlodipine  (edema in the past). Could consider ARB as Crt improves  Dispo: inpt   Subjective:   Patient still has a hard time catching his breath today but thinks it's better after HD yest.    Objective:   BP  (!) 125/54 (BP Location: Left Arm)   Pulse 80   Temp 98 F (36.7 C) (Oral)   Resp (!) 23   Ht 6' 1 (1.854 m)   Wt 79.8 kg   SpO2 100%   BMI 23.21 kg/m  No intake or output data in the 24 hours ending 04/13/24 1249  Weight change:   Physical Exam: GEN: Up in bed with no distress PULM: Bilateral chest rise with no increased work of breathing CV: Tachycardic, no rub ABD: + PEG, mild distention EXT: Trace edema in the lower extremities, 1+ in the bilateral upper extremities SKIN: petechial nonblanching rash thighs, palms, arms NEURO: awake, alert, interactive Dialysis access: RIJ temp HD catheter  Imaging: DG CHEST PORT 1 VIEW Result Date: 04/13/2024 CLINICAL DATA:  Pneumonia. EXAM: PORTABLE CHEST 1 VIEW COMPARISON:  04/12/2024 FINDINGS: Patchy bilateral airspace disease is similar to prior. Tiny effusions again noted. The cardio pericardial silhouette is enlarged. Right IJ central line tip overlies the distal SVC level. Right PICC line tip is stable over the mid to distal SVC. IMPRESSION: No  substantial interval change. Patchy bilateral airspace disease with tiny effusions. Electronically Signed   By: Camellia Candle M.D.   On: 04/13/2024 09:13   DG CHEST PORT 1 VIEW Result Date: 04/13/2024 CLINICAL DATA:  Pneumonia. EXAM: PORTABLE CHEST 1 VIEW COMPARISON:  04/12/2024 FINDINGS: Similar patchy bilateral airspace disease scattered relatively diffusely in both lungs. Tiny pleural effusions again noted. Right IJ central line tip overlies the low SVC level. Right PICC line tip projects at the mid SVC level. The cardio pericardial silhouette is mildly enlarged. Telemetry leads overlie the chest. IMPRESSION: 1. No substantial interval change in patchy bilateral airspace disease. 2. Tiny bilateral pleural effusions. Electronically Signed   By: Camellia Candle M.D.   On: 04/13/2024 05:56   DG Chest Port 1 View Result Date: 04/12/2024 EXAM: 1 VIEW(S) XRAY OF THE CHEST 04/12/2024 06:45:00 AM  COMPARISON: 04/09/2024 CLINICAL HISTORY: 73 year old male with acute respiratory distress. FINDINGS: LINES, TUBES AND DEVICES: Left chest cardiac loop recorder noted. Stable right IJ approach dual lumen catheter in place. Stable right PICC line in place. LUNGS AND PLEURA: Worsening patchy and confluent bilateral pulmonary opacity, most confluent in the right upper lobe. Small bilateral pleural effusions. No pneumothorax. HEART AND MEDIASTINUM: Left chest cardiac loop recorder noted. No acute abnormality of the cardiac and mediastinal silhouettes. BONES AND SOFT TISSUES: Paucity of bowel gas. No acute osseous abnormality. IMPRESSION: 1. Worsening bilateral patchy confluent opacities, greatest RUL. Small bilateral pleural effusions. Consider progressive pulmonary edema or bilateral infection. Electronically signed by: Helayne Hurst MD 04/12/2024 06:57 AM EST RP Workstation: [id number]     Labs: BMET Recent Labs  Lab 04/07/24 0252 04/07/24 2250 04/08/24 0444 04/09/24 0424 04/10/24 0055 04/11/24 1231 04/12/24 0247 04/13/24 0520  NA 134* 132* 132* 130* 130* 133* 134* 131*  K 4.7 4.8 4.5 5.2* 4.5 5.6* 5.7* 4.8  CL 96* 95* 94* 94* 93* 95* 95* 93*  CO2 26 28 26 24 26 24 27 28   GLUCOSE 147* 105* 131* 128* 172* 175* 152* 88  BUN 96* 85* 89* 107* 73* 105* 111* 80*  CREATININE 3.64* 3.20* 3.26* 4.12* 3.39* 4.39* 4.61* 3.37*  CALCIUM  8.1* 7.9* 8.0* 8.0* 8.0* 8.2* 8.1* 7.8*  PHOS 4.4  --  3.7 3.2 2.8 3.3 3.7 4.2   CBC Recent Labs  Lab 04/10/24 0055 04/11/24 0322 04/12/24 0247 04/13/24 0520  WBC 23.7* 20.4* 20.3* 15.0*  HGB 7.5* 7.3* 7.6* 7.0*  HCT 23.1* 22.3* 23.4* 21.7*  MCV 84.9 83.5 84.5 82.2  PLT 151 164 187 172    Medications:     amiodarone   200 mg Per Tube BID   atorvastatin   80 mg Per Tube Daily   carvedilol   3.125 mg Per Tube BID WC   Chlorhexidine  Gluconate Cloth  6 each Topical Daily   clonazePAM   0.5 mg Per Tube BID   ezetimibe   10 mg Per Tube Daily   famotidine   20 mg Per  Tube Daily   feeding supplement (PROSource TF20)  60 mL Per Tube BID   fiber  1 packet Per Tube BID   [name] butt cream   Topical BID   guaiFENesin   10 mL Per Tube Q4H   insulin  aspart  0-9 Units Subcutaneous Q4H   lidocaine   1 patch Transdermal Q24H   multivitamin  1 tablet Per Tube QHS   pantoprazole  (PROTONIX ) IV  40 mg Intravenous Q12H   [START ON 04/14/2024] predniSONE   20 mg Oral Q breakfast   Followed by   NOREEN ON  04/21/2024] predniSONE   10 mg Oral Q breakfast   QUEtiapine   12.5 mg Per Tube QHS   saccharomyces boulardii  250 mg Per Tube BID   sodium chloride  flush  10-40 mL Intracatheter Q12H   torsemide   100 mg Oral Daily

## 2024-04-13 NOTE — Plan of Care (Signed)
" °  Problem: Education: Goal: Knowledge of General Education information will improve Description: Including pain rating scale, medication(s)/side effects and non-pharmacologic comfort measures 04/13/2024 1323 by Elaine Verdel SAILOR, RN Outcome: Progressing 04/13/2024 1321 by Elaine Verdel SAILOR, RN Outcome: Progressing   Problem: Clinical Measurements: Goal: Will remain free from infection 04/13/2024 1323 by Elaine Verdel SAILOR, RN Outcome: Progressing 04/13/2024 1321 by Elaine Verdel SAILOR, RN Outcome: Progressing   Problem: Clinical Measurements: Goal: Cardiovascular complication will be avoided 04/13/2024 1323 by Elaine Verdel SAILOR, RN Outcome: Progressing 04/13/2024 1321 by Elaine Verdel SAILOR, RN Outcome: Progressing   Problem: Clinical Measurements: Goal: Respiratory complications will improve 04/13/2024 1323 by Elaine Verdel SAILOR, RN Outcome: Progressing 04/13/2024 1321 by Elaine Verdel SAILOR, RN Outcome: Progressing   Problem: Activity: Goal: Risk for activity intolerance will decrease 04/13/2024 1323 by Elaine Verdel SAILOR, RN Outcome: Progressing 04/13/2024 1321 by Elaine Verdel SAILOR, RN Outcome: Progressing   Problem: Nutrition: Goal: Adequate nutrition will be maintained 04/13/2024 1323 by Elaine Verdel SAILOR, RN Outcome: Progressing 04/13/2024 1321 by Elaine Verdel SAILOR, RN Outcome: Progressing   "

## 2024-04-13 NOTE — Progress Notes (Signed)
 PHARMACY - ANTICOAGULATION CONSULT NOTE  Pharmacy Consult for Heparin  Indication: atrial fibrillation and DVT  Allergies[1]  Patient Measurements: Height: 6' 1 (185.4 cm) Weight: 79.8 kg (175 lb 14.8 oz) IBW/kg (Calculated) : 79.9 HEPARIN  DW (KG): 79.8  Vital Signs: Temp: 98 F (36.7 C) (12/27 0705) Temp Source: Oral (12/27 0705) BP: 137/54 (12/27 0705) Pulse Rate: 103 (12/27 0743)  Labs: Recent Labs    04/11/24 0322 04/11/24 0322 04/11/24 1231 04/12/24 0247 04/12/24 1830 04/13/24 0520 04/13/24 0815  HGB 7.3*  --   --  7.6*  --  7.0*  --   HCT 22.3*  --   --  23.4*  --  21.7*  --   PLT 164  --   --  187  --  172  --   APTT  --   --   --   --   --   --  66*  HEPARINUNFRC  --    < >  --  0.52 0.45 >1.10* 0.32  CREATININE  --   --  4.39* 4.61*  --  3.37*  --    < > = values in this interval not displayed.    Estimated Creatinine Clearance: 22.7 mL/min (A) (by C-G formula based on SCr of 3.37 mg/dL (H)).  Assessment: Billy [name] is a 73 y.o. year old male admitted on 01/21/2024 with concern for esophageal rupture. On eliquis  prior to admission for afib and hx DVT (last dose pta 12/9 PM). Pharmacy consulted to dose heparin  for afib with no bolus.    Will start heparin  drip at 1800 uts/hr given previously therapeutic on this dose during this admission. Hemoglobin low-7.3, will continue to monitor. Platelets stable at 164.    12/27 AM update:  Initial Heparin  level supratherapeutic at > 1.1 - level thought to have been drawn thru line without flushing. Repeat Heparin  level in goal range at 0.32, aPTT 66 secs  Goal of Therapy:  Heparin  level 0.3-0.5 units/ml Monitor platelets by anticoagulation protocol: Yes   Plan:  Increase heparin  infusion to 1750 units/hr. Heparin  level and CBC in AM  Donny Alert, PharmD, [facility] Clinical Pharmacist Please see AMION for all Pharmacists' Contact Phone Numbers 04/13/2024, 9:24 AM        [1]  Allergies Allergen Reactions    Hydralazine  Hcl     Concern for drug induced lupus   Norvasc  [Amlodipine ] Swelling and Other (See Comments)    Excessive BLE swelling Skin discoloration, blotching   Jardiance  [Empagliflozin ] Itching and Other (See Comments)    Patient mentioned penile swelling, pain   Morphine  And Codeine Itching    Opioid-induced pruritus

## 2024-04-14 ENCOUNTER — Inpatient Hospital Stay (HOSPITAL_COMMUNITY)

## 2024-04-14 DIAGNOSIS — R109 Unspecified abdominal pain: Secondary | ICD-10-CM | POA: Diagnosis not present

## 2024-04-14 DIAGNOSIS — J9620 Acute and chronic respiratory failure, unspecified whether with hypoxia or hypercapnia: Secondary | ICD-10-CM | POA: Diagnosis not present

## 2024-04-14 DIAGNOSIS — D72829 Elevated white blood cell count, unspecified: Secondary | ICD-10-CM | POA: Diagnosis not present

## 2024-04-14 DIAGNOSIS — J811 Chronic pulmonary edema: Secondary | ICD-10-CM | POA: Diagnosis not present

## 2024-04-14 DIAGNOSIS — I4891 Unspecified atrial fibrillation: Secondary | ICD-10-CM | POA: Diagnosis not present

## 2024-04-14 DIAGNOSIS — K223 Perforation of esophagus: Secondary | ICD-10-CM | POA: Diagnosis not present

## 2024-04-14 LAB — CBC
HCT: 19.2 % — ABNORMAL LOW (ref 39.0–52.0)
Hemoglobin: 6.3 g/dL — CL (ref 13.0–17.0)
MCH: 27.4 pg (ref 26.0–34.0)
MCHC: 32.8 g/dL (ref 30.0–36.0)
MCV: 83.5 fL (ref 80.0–100.0)
Platelets: 155 K/uL (ref 150–400)
RBC: 2.3 MIL/uL — ABNORMAL LOW (ref 4.22–5.81)
RDW: 17.2 % — ABNORMAL HIGH (ref 11.5–15.5)
WBC: 13.6 K/uL — ABNORMAL HIGH (ref 4.0–10.5)
nRBC: 0 % (ref 0.0–0.2)

## 2024-04-14 LAB — GLUCOSE, CAPILLARY
Glucose-Capillary: 154 mg/dL — ABNORMAL HIGH (ref 70–99)
Glucose-Capillary: 159 mg/dL — ABNORMAL HIGH (ref 70–99)
Glucose-Capillary: 164 mg/dL — ABNORMAL HIGH (ref 70–99)
Glucose-Capillary: 167 mg/dL — ABNORMAL HIGH (ref 70–99)
Glucose-Capillary: 167 mg/dL — ABNORMAL HIGH (ref 70–99)
Glucose-Capillary: 178 mg/dL — ABNORMAL HIGH (ref 70–99)
Glucose-Capillary: 237 mg/dL — ABNORMAL HIGH (ref 70–99)

## 2024-04-14 LAB — RENAL FUNCTION PANEL
Albumin: 1.9 g/dL — ABNORMAL LOW (ref 3.5–5.0)
Anion gap: 9 (ref 5–15)
BUN: 65 mg/dL — ABNORMAL HIGH (ref 8–23)
CO2: 30 mmol/L (ref 22–32)
Calcium: 7.5 mg/dL — ABNORMAL LOW (ref 8.9–10.3)
Chloride: 96 mmol/L — ABNORMAL LOW (ref 98–111)
Creatinine, Ser: 2.77 mg/dL — ABNORMAL HIGH (ref 0.61–1.24)
GFR, Estimated: 24 mL/min — ABNORMAL LOW
Glucose, Bld: 172 mg/dL — ABNORMAL HIGH (ref 70–99)
Phosphorus: 3.8 mg/dL (ref 2.5–4.6)
Potassium: 4.1 mmol/L (ref 3.5–5.1)
Sodium: 135 mmol/L (ref 135–145)

## 2024-04-14 LAB — BASIC METABOLIC PANEL WITH GFR
Anion gap: 11 (ref 5–15)
BUN: 77 mg/dL — ABNORMAL HIGH (ref 8–23)
CO2: 28 mmol/L (ref 22–32)
Calcium: 7.9 mg/dL — ABNORMAL LOW (ref 8.9–10.3)
Chloride: 94 mmol/L — ABNORMAL LOW (ref 98–111)
Creatinine, Ser: 3.13 mg/dL — ABNORMAL HIGH (ref 0.61–1.24)
GFR, Estimated: 20 mL/min — ABNORMAL LOW
Glucose, Bld: 205 mg/dL — ABNORMAL HIGH (ref 70–99)
Potassium: 4.7 mmol/L (ref 3.5–5.1)
Sodium: 132 mmol/L — ABNORMAL LOW (ref 135–145)

## 2024-04-14 LAB — CBC WITH DIFFERENTIAL/PLATELET
Abs Immature Granulocytes: 0.4 K/uL — ABNORMAL HIGH (ref 0.00–0.07)
Basophils Absolute: 0 K/uL (ref 0.0–0.1)
Basophils Relative: 0 %
Eosinophils Absolute: 0 K/uL (ref 0.0–0.5)
Eosinophils Relative: 0 %
HCT: 29.9 % — ABNORMAL LOW (ref 39.0–52.0)
Hemoglobin: 9.8 g/dL — ABNORMAL LOW (ref 13.0–17.0)
Immature Granulocytes: 3 %
Lymphocytes Relative: 2 %
Lymphs Abs: 0.3 K/uL — ABNORMAL LOW (ref 0.7–4.0)
MCH: 27.6 pg (ref 26.0–34.0)
MCHC: 32.8 g/dL (ref 30.0–36.0)
MCV: 84.2 fL (ref 80.0–100.0)
Monocytes Absolute: 0.4 K/uL (ref 0.1–1.0)
Monocytes Relative: 3 %
Neutro Abs: 11.4 K/uL — ABNORMAL HIGH (ref 1.7–7.7)
Neutrophils Relative %: 92 %
Platelets: 177 K/uL (ref 150–400)
RBC: 3.55 MIL/uL — ABNORMAL LOW (ref 4.22–5.81)
RDW: 16.6 % — ABNORMAL HIGH (ref 11.5–15.5)
WBC: 12.5 K/uL — ABNORMAL HIGH (ref 4.0–10.5)
nRBC: 0 % (ref 0.0–0.2)

## 2024-04-14 LAB — LACTIC ACID, PLASMA: Lactic Acid, Venous: 1.9 mmol/L (ref 0.5–1.9)

## 2024-04-14 LAB — MAGNESIUM
Magnesium: 1.9 mg/dL (ref 1.7–2.4)
Magnesium: 2 mg/dL (ref 1.7–2.4)

## 2024-04-14 LAB — HEPARIN LEVEL (UNFRACTIONATED): Heparin Unfractionated: 0.28 [IU]/mL — ABNORMAL LOW (ref 0.30–0.70)

## 2024-04-14 LAB — PREPARE RBC (CROSSMATCH)

## 2024-04-14 MED ORDER — MEXILETINE HCL 200 MG PO CAPS
200.0000 mg | ORAL_CAPSULE | Freq: Two times a day (BID) | ORAL | Status: DC
  Administered 2024-04-15 – 2024-04-17 (×5): 200 mg via ORAL

## 2024-04-14 MED ORDER — MAGNESIUM SULFATE 2 GM/50ML IV SOLN
2.0000 g | Freq: Once | INTRAVENOUS | Status: AC
  Administered 2024-04-14: 2 g via INTRAVENOUS
  Filled 2024-04-14: qty 50

## 2024-04-14 MED ORDER — MAGNESIUM SULFATE 50 % IJ SOLN: 1.0000 g | Freq: Once | INTRAMUSCULAR | Status: DC

## 2024-04-14 MED ORDER — AMIODARONE HCL IN DEXTROSE 360-4.14 MG/200ML-% IV SOLN
30.0000 mg/h | INTRAVENOUS | Status: DC
  Administered 2024-04-14 – 2024-04-17 (×6): 30 mg/h via INTRAVENOUS
  Filled 2024-04-14: qty 200

## 2024-04-14 MED ORDER — AMIODARONE HCL IN DEXTROSE 360-4.14 MG/200ML-% IV SOLN
60.0000 mg/h | INTRAVENOUS | Status: DC
  Administered 2024-04-14 (×2): 60 mg/h via INTRAVENOUS
  Filled 2024-04-14: qty 200

## 2024-04-14 MED ORDER — AMIODARONE LOAD VIA INFUSION
150.0000 mg | Freq: Once | INTRAVENOUS | Status: AC
  Administered 2024-04-14: 150 mg via INTRAVENOUS
  Filled 2024-04-14: qty 83.34

## 2024-04-14 MED ORDER — SODIUM CHLORIDE 0.9% IV SOLUTION
Freq: Once | INTRAVENOUS | Status: AC
  Administered 2024-04-14: 10 mL via INTRAVENOUS
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
## 2024-04-14 NOTE — Progress Notes (Addendum)
 Patient has had several runs of slow vtach today that lasted 1-30 seconds and then back to atrial fib in the 80's to 100's. Cardiology MD Donnice Duffel present on floor when patient had a 45 sec tro one minute run of vtach. Patient denied complaints and remained alert and oriented and warm and dry. Patient asymptomatic. MD to see patient. New orders placed by Dr. CHRISTELLA Duffel.

## 2024-04-14 NOTE — Plan of Care (Signed)
 Patient having low hemoglobin getting blood today. Patient's heart rate with vtach multiple times today. IV amio started today.

## 2024-04-14 NOTE — Progress Notes (Signed)
 PHARMACY - ANTICOAGULATION CONSULT NOTE  Pharmacy Consult for Heparin  Indication: atrial fibrillation and DVT  Allergies[1]  Patient Measurements: Height: 6' 1 (185.4 cm) Weight: 73.2 kg (161 lb 6 oz) IBW/kg (Calculated) : 79.9 HEPARIN  DW (KG): 79.8  Vital Signs: Temp: 97.5 F (36.4 C) (12/27 2334) Temp Source: Oral (12/27 2334) BP: 112/49 (12/28 0337) Pulse Rate: 90 (12/28 0337)  Labs: Recent Labs    04/12/24 0247 04/12/24 1830 04/13/24 0520 04/13/24 0815 04/14/24 0500  HGB 7.6*  --  7.0*  --  6.3*  HCT 23.4*  --  21.7*  --  19.2*  PLT 187  --  172  --  155  APTT  --   --   --  66*  --   HEPARINUNFRC 0.52   < > >1.10* 0.32 0.28*  CREATININE 4.61*  --  3.37*  --  2.77*   < > = values in this interval not displayed.    Estimated Creatinine Clearance: 25.3 mL/min (A) (by C-G formula based on SCr of 2.77 mg/dL (H)).  Assessment: Billy [name] is a 73 y.o. year old male admitted on 01/21/2024 with concern for esophageal rupture. On eliquis  prior to admission for afib and hx DVT (last dose pta 12/9 PM). Pharmacy consulted to dose heparin  for afib with no bolus.    Will start heparin  drip at 1800 uts/hr given previously therapeutic on this dose during this admission. Hemoglobin low-7.3, will continue to monitor. Platelets stable at 164.    12/28 AM update:  AM Heparin  level below goal range at 0.28, Hgb has dropped from 7 >>6.3. Transfusion ordered per night coverage. D/W Dr. Glinda hold heparin  at this time.   Goal of Therapy:  Heparin  level 0.3-0.5 units/ml Monitor platelets by anticoagulation protocol: Yes   Plan:  Hold heparin  infusion F/u with [name] for resumption of drip. Heparin  level and CBC in AM  Kuulei Kleier A. Lyle, PharmD, BCPS, FNKF Clinical Pharmacist [place] Please utilize Amion for appropriate phone number to reach the unit pharmacist [facility])  04/14/2024, 7:54 AM         [1]  Allergies Allergen Reactions   Hydralazine  Hcl      Concern for drug induced lupus   Norvasc  [Amlodipine ] Swelling and Other (See Comments)    Excessive BLE swelling Skin discoloration, blotching   Jardiance  [Empagliflozin ] Itching and Other (See Comments)    Patient mentioned penile swelling, pain   Morphine  And Codeine Itching    Opioid-induced pruritus

## 2024-04-14 NOTE — Progress Notes (Signed)
"  ° °      CROSS COVER NOTE  NAME: Billy [name] MRN: [id number] DOB : [birth year]    Concern as stated by nurse / staff   this pt has been having multiple pvcs a least the past couple two days and having runs of non-sustained vtach. However around 0330 pt a 59 bt run of vtach while asleep. asymptomatic   Hb 6.3     Pertinent findings on chart review: History of cardiac arrest 06/2023, HFpEF (EF 55-60% 03/27/24), A fib with recurrent RVR, currently on amiodarone  200mg  po bid and coreg .  Patient Assessment    04/14/2024    3:37 AM 04/14/2024    3:12 AM 04/13/2024   11:34 PM  Vitals with BMI  Weight  161 lbs 6 oz   BMI  21.3   Systolic 112  100  Diastolic 49  55  Pulse 90  82      Latest Ref Rng & Units 04/14/2024    5:00 AM 04/13/2024    5:20 AM 04/12/2024    2:47 AM  CBC  WBC 4.0 - 10.5 K/uL 13.6  15.0  20.3   Hemoglobin 13.0 - 17.0 g/dL 6.3  7.0  7.6   Hematocrit 39.0 - 52.0 % 19.2  21.7  23.4   Platelets 150 - 400 K/uL 155  172  187      Assessment and  Interventions   Assessment:  Sustained V tach 59 beats/?A fib with aberrancy , asymptomatic Severe anemia History of cardiac arrest  Plan: EKG, K+ and Mg ordered Will transfuse 1 unit prbc Consider cardiology reconsult Might need amiodarone  IV rebolus but will defer to cardiology F/u electrolytes Continue current amiodarone  and Coreg  Continue close monitoring       "

## 2024-04-14 NOTE — Progress Notes (Signed)
 "  NAME:  Billy [name], MRN:  [id number], DOB:  January 08, 1951, LOS: 84 ADMISSION DATE:  01/21/2024, CONSULTATION DATE: 04/12/2024 REFERRING MD: Royal , CHIEF COMPLAINT: SOB  History of Present Illness:  73 year old male with a history cardiac arrest, Boerhaave syndrome ,esophageal stent, respiratory failure, IVC filter for recurrent GI bleed, ESRD on HD, this a.m. noted to be hypoxic, and hypertensive requiring urgent hemodialysis Critical care team was consulted for evaluation  Pertinent  Medical History  Cardiac arrest, Boerhaave syndrome, respiratory failure, IVC filter, recurrent GI bleed  Significant Hospital Events: Including procedures, antibiotic start and stop dates in addition to other pertinent events   10/7 esophageal stent placed by cardiothoracic 10/15 stent reposition 10/16 extubated 10/20 reintubated with ultrasound showing left-sided pleural effusion-left-sided chest tube placed-by lights criteria exudative effusion 10/23 echo 60-65% 10/24 barium swallow showed persistent leak 10/28 transferred out of ICU 11/3 PEG tube placed 11/19 swelling of right arm showing on ultrasound SVT cephalic vein 11/22 chest x-ray?  Pneumonia done because of back pain-follow-up CT scan shows stent in place no mediastinal fluid collection small bilateral pleural effusions with bibasilar atelectasis pneumonia not excluded-scattered clusters nodular densities predominantly right upper and right lower lobes with nodule 11 mm as previously seen?  Mets?  Infection?  Aspiration 11/26 stent removed. Esophagram negative for esophageal leak.  11/27 getting diuresis again 11/28 duodenitis/enteritis.  GI consulted. Bloody emesis added carafate   11/29 trialing clears 12/1 NV again w/ hematemesis. Lasix  held ---  esophagram performed showing patent esophagus without perforation irregularity mid esophagus?  Stent diffuse narrowing lumen distal duodenum and proximal jejunum 12/2 EGD ischemic ulcers. Impression:   - Congested, inflamed, nodular, texture changed  mucosa in the esophagus. - Gastritis, Non-bleeding duodenal ulcers with a clean ulcer  base (Forrest Class III).- Non-bleeding jejunal ulcers with a clean ulcer   base (Forrest Class III)  - Findings suggestive of ischemic ulcers. Increased carafate  and cont BID PPI  12/3 full liq diet  12/4 marked increased WOB. Acute and progressive w/ increased hypoxia and HTN. PCCM asked to see  12/10 again increased WOB, stubborn to diuresis, unable to oxygenate, to ICU--- azotemia and renal consulted 12/11 Intubated  12/12 extubated 12/13 c/o of epigastric pain but suspect more anxiety component, on cleviprex , CRRT for volume removal 12/15 pain improved, on RA, on clev and CRRT, renal bx pending 12/16 renal biopsy performed showing 12/17 off clev transition to iHD, transfer to floor  12/18: TDC placed 12/19 [name] assumed care, has distended and painful abdomen today, KUB shows improvement so tube feeds resumed, Eliquis  resumed 12/22 hemoccult neg---transfuse 1 U--- Eliquis  had to be held in the setting 12/23 vascular consulted secondary to lower extremities--- sleepy this morning-x-ray shows congestion no pneumonia white count up-- 12/24 ABIs showed abnormal right and left toe brachial indices-no intervention from vascular required 12/26 severe respiratory distress needing nonrebreather etc.-A-fib RVR once again-x-ray got emergent dialysis 12/27 still with significant oxygen  requirement and pulmonary edema dialyzed early again 12/28 oxygen  requirements down to 4 L from heated high flow still with some degree of pulmonary edema work of breathing improved  Interim History / Subjective:  Breathing has improved down to 4 L nasal cannula  Objective    Blood pressure (!) 112/49, pulse 90, temperature (!) 97.5 F (36.4 C), temperature source Oral, resp. rate 20, height 6' 1 (1.854 m), weight 73.2 kg, SpO2 90%.    FiO2 (%):  [60 %] 60 %   Intake/Output  Summary (Last 24 hours) at 04/14/2024 0734  Last data filed at 04/14/2024 0500 Gross per 24 hour  Intake 240 ml  Output 1150 ml  Net -910 ml   Filed Weights   04/13/24 1439 04/13/24 1744 04/14/24 0312  Weight: 77.4 kg (P) 75.1 kg 73.2 kg    Examination: General This is a frail 73 year old male patient resting in bed no acute distress HEENT normocephalic atraumatic no jugular venous distention is appreciated Pulmonary diffuse scattered rales.  Currently on 4 L/min.  Still gets a little short of breath with exertion, able to speak in longer phrases without dyspnea.  Portable chest x-ray personally reviewed shows improved pulmonary edema but certainly still present Cardiac regular rate and rhythm,  abdomen soft nontender, PEG present Extremities still has dependent edema Neuro awake and oriented  Assessment and Plan   Acute on chronic respiratory failure Fluid overload Volume mediated hypertensive pulmonary edema-improving with IHD HD dependent History of Boerhaave syndrome s/p stent History of recurrent GI bleed Nonspecific leukocytosis Normocytic anemia A-fib with RVR History of cardiac arrest DVT PE-IVC filter for recurrent GI bleed  Acute on chronic respiratory failure secondary to acute pulmonary edema, secondary to hypertensive crisis and volume overload,   Discussion  73 year old male with history of Boerhaave syndrome s/p stent, respiratory failure, IVC filter for recurrent GI bleed, ESRD on HD, on the a.m. of 12/26 complaining of shortness of breath, was hypoxic, hypertensive and required urgent hemodialysis - Status post emergent dialysis on 12/26. On 27th WOB better. Still had edema & sig O2 need. Received another round of iHD -12/28  Supplemental oxygen  requirements: now down to 4 lpm but had gotten down to 2 lpm on 27th  Plan Cont supplemental oxygen  for pulse oximetry greater than 90% Okay to intermittently alternate with NIPPV Anticipate he will need iHD again  tomorrow  Agree with ongoing goals of care discussion Looks like today is last dose of Unasyn . Think this is very reasonable   We are going to sign off call if needed   My time 26 min included: review of most recent records, direct face to face time obtaining history, performing physical exam, developing and documenting plan as well as discussing this plan with the patient and/or care givers.  [id number] on-going, stable,recovering or improving problem >25 min                 "

## 2024-04-14 NOTE — Progress Notes (Signed)
 " Progress Note   Patient: Billy [name] FMW:[id number] DOB: [birth year] DOA: 01/21/2024     73 DOS: the patient was seen and examined on 04/14/2024   Brief hospital course: 73 year old male Prolonged admission 3/10-07/20/18/2025 cardiac arrest requiring ICU management cardioverted for A-fib-found to have DVTs then had hematochezia necessitating IVC filter-recurrent GI bleed secondary to erosive gastropathy and friable duodenal mucosa and colonoscopy was deferred and then chest tube placements cardiac cath at that time showed CAD with CTO of RCA and was treated with diuretics and sent home on torsemide  Entresto  Jardiance -   Readmitted 10/5 with nausea vomiting sudden tearing sensation found to have an esophageal tear  10/7 esophageal stent placed by cardiothoracic 10/15 stent reposition 10/16 extubated 10/20 reintubated with ultrasound showing left-sided pleural effusion-left-sided chest tube placed-by lights criteria exudative effusion 10/23 echo 60-65% 10/24 barium swallow showed persistent leak 10/28 transferred out of ICU 11/3 PEG tube placed 11/19 swelling of right arm showing on ultrasound SVT cephalic vein 11/22 chest x-ray?  Pneumonia done because of back pain-follow-up CT scan shows stent in place no mediastinal fluid collection small bilateral pleural effusions with bibasilar atelectasis pneumonia not excluded-scattered clusters nodular densities predominantly right upper and right lower lobes with nodule 11 mm as previously seen?  Mets?  Infection?  Aspiration 11/26 stent removed. Esophagram negative for esophageal leak.  11/27 getting diuresis again 11/28 duodenitis/enteritis.  GI consulted. Bloody emesis added carafate   11/29 trialing clears 12/1 NV again w/ hematemesis. Lasix  held ---  esophagram performed showing patent esophagus without perforation irregularity mid esophagus?  Stent diffuse narrowing lumen distal duodenum and proximal jejunum 12/2 EGD ischemic ulcers. Impression:   - Congested, inflamed, nodular, texture changed  mucosa in the esophagus. - Gastritis, Non-bleeding duodenal ulcers with a clean ulcer  base (Forrest Class III).- Non-bleeding jejunal ulcers with a clean ulcer   base (Forrest Class III)  - Findings suggestive of ischemic ulcers. Increased carafate  and cont BID PPI  12/3 full liq diet  12/4 marked increased WOB. Acute and progressive w/ increased hypoxia and HTN. PCCM asked to see  12/10 again increased WOB, stubborn to diuresis, unable to oxygenate, to ICU--- azotemia and renal consulted 12/11 Intubated  12/12 extubated 12/13 c/o of epigastric pain but suspect more anxiety component, on cleviprex , CRRT for volume removal 12/15 pain improved, on RA, on clev and CRRT, renal bx pending 12/16 renal biopsy performed showing 12/17 off clev transition to iHD, transfer to floor  12/18: TDC placed 12/19 [name] assumed care, has distended and painful abdomen today, KUB shows improvement so tube feeds resumed, Eliquis  resumed 12/22 hemoccult neg---transfuse 1 U--- Eliquis  had to be held in the setting 12/23 vascular consulted secondary to lower extremities--- sleepy this morning-x-ray shows congestion no pneumonia white count up-- 12/24 ABIs showed abnormal right and left toe brachial indices-no intervention from vascular required 12/26 severe respiratory distress needing nonrebreather   Assessment and Plan: Iatrogenic volume overload 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 as per renal not really candidate for alternate agents Hyperkalemia Stop Lokelma  defer further management hyperkalemia to renal IHD with [facility] 12/18 placed Note events from 12/26-transferred to progressive unit heated high flow after dialysis yesterday and still quite volume overloaded-do not feel strongly this is pneumonia-Will need to see how he does over the next several days-palliative consult is pending  --appreciate critical care seeing him   Nonspecific leukocytosis afebrile   Boerhaave syndrome,--- events as above-- moderate to severe abdominal pain Ischemic  ulcer disease--GI and cardiothoracic aware-several CTs since 11/25---CT scan done 12/21 unremarkable-- NO Carafate  with HD, stop Maalox Last esophagram as above 12/1 Pantoprazole  40 IV twice daily, Pepcid  20 daily Severe Malnutrition-BMI 22 evidence of weight loss S/p PEG Tolerating TF. Encourage po as tolerated   Normocytic anemia secondary to peptic ulcer disease,?  Ongoing gastritis as above Transfused PRBC x 1u in October, x1 unit 03/29/2024,12/23, 12/28 No obvious blood loss at this time, f/u on hemeoccult    Cardiac arrest 06/2023 with HF MR EF 55-65% 03/27/2024 A-fib RVR CHADVASC >4 on Eliquis  [cardioverted 06/2023]--- recurrent RVR at times Eliquis  has been held since 12/22 --- on heparin  now watch closely cardiology consulted 12/21-signed off-patient continues Amio 200 twice daily --continue Coreg  6.25 twice daily for recurrent A-fib RVR RVR Entresto  and torsemide  forever held given current issues  DVT PE March 2025 does have IVC filter in place since March Anticoagulation resumed cautiously 12/25 ?  PAD with mottling of skin on removal of Unna boot from pictures in 12/22 note  Lower limb ischemia ruled out 12.23 ABI rules out ischemia-appreciate VVS--no intervention needed   Insomnia May continue trazodone  50-? Seroquel  to 12.5 at bedtime---? hydroxyzine  to 25 3 times daily given aninxety   Diabetes CBG 108-160-only every 4 coverage   Multiple bedsores and wounds anterior right and left foot and injury stage I to bottom Continue Gerhardt twice daily Lidoderm  patch for back pain---Bed sore last examined 12/22 and looks good Schedule Lomotil  1 tablet twice daily, can use as needed Lomotil   4 times daily in addition  VT - Noted overnight and recurrent run today - Cardiology reconsulted. Orders for IV amio noted  per Cardiology    Resolved/stable problems Petechial rash Hydralazine  and Unasyn  have been stopped, cannot rule out HSP-this seems unlikely to me however he has mottling in general over his skin-see the pictures from 12/22 of his feet-seem stable to me Acute hypoxic respiratory failure//Aspiration Pneumonia//Pleural effusion//Acute on chronic diastolic CHF exacerbation ?respiratory failure due to pneumonia, pleural effusion, CHF exacerbation -- Better c Lasix  and antibiotics. Intemrittently seems to have this Bilateral UE swelling--duplex upper extremities ruled out DVT Lung nodules-repeat CT in Jan/Feb window        Subjective: Still feeling somewhat sob this AM. Run of VT noted overnight  Physical Exam: Vitals:   04/14/24 1300 04/14/24 1400 04/14/24 1425 04/14/24 1500  BP: (!) 151/69 (!) 163/78 (!) 145/73   Pulse: (!) 107 (!) 104    Resp: (!) 27 (!) 34 (!) 30   Temp:      TempSrc:      SpO2: 92% 92% 95% 95%  Weight:      Height:       General exam: Awake, laying in bed, in nad Respiratory system: Increased respiratory effort, no wheezing Cardiovascular system: regular rate, s1, s2 Gastrointestinal system: Soft, nondistended, positive BS Central nervous system: CN2-12 grossly intact, strength intact Extremities: Perfused, no clubbing Skin: Normal skin turgor, no notable skin lesions seen Psychiatry: Mood normal // no visual hallucinations   Data Reviewed:  Labs reviewed: Na 135, K 4.1, Cr 2.77, Mg 1.9, WBC 13.6, Hgb 6.3, Plts 155  Family Communication: Pt in room, family not at bedside  Disposition: Status is: Inpatient Remains inpatient appropriate because: severity of illness  Planned Discharge Destination: LTC    Author: Garnette Pelt, MD 04/14/2024 4:39 PM  For on call review [email or web address].  "

## 2024-04-14 NOTE — Progress Notes (Signed)
  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  He has completed rounds of antibiotics.  Mild interstitial fibrosis/tubular atrophy.  - Given concern for infection we provided steroids but avoided other IS medications due to weighing risks/benefits - on pred taper now with plans to wean quickly given the main concern is infection as a cause -he was on minoxidil  which caries similar risk for AI kidney phenomena as hydralazine . Stopped this (last dose 12/22) and started amlodipine  (he had swelling previously so will monitor) -some signs of renal recovery with increasing urine output -did receive contrast on 12/21 which may hinder Crt improvement - Dialysis sessions have been cut short because of A-fib with RVR and he is in afib RVR again this am, hypertensive as well - Able to UF 3L on Fri, Sat 2.28L Sat.  Continue torsemide  to help with volume management. UOP/24hr, will hold HD Mon and eval for any signs of renal recovery.   Petechial rash;             - 2/2 Iga. Mgmt as above   Acute on chronic CHF exacerbation;             - HD and diuretics as above   H/o DVT and PE:             - had RH strain previously             - on eliquis    Boorehave's syndrome             - s/p repair             - Has a PEG  - EGD 03/19/24 with ulcers  - PPI and pepcid    HTN  -stopped minoxidil . On BB and amlodipine  (edema in the past). Could consider ARB as Crt improves  Dispo: inpt   Subjective:   Patient less SOB after HD; UOP may be picking up.   Objective:   BP (!) 145/73   Pulse (!) 104   Temp 98.7 F (37.1  C) (Oral)   Resp (!) 30   Ht 6' 1 (1.854 m)   Wt 73.2 kg   SpO2 95%   BMI 21.29 kg/m   Intake/Output Summary (Last 24 hours) at 04/14/2024 1701 Last data filed at 04/14/2024 1522 Gross per 24 hour  Intake 594 ml  Output 440 ml  Net 154 ml    Weight change: -3.9 kg  Physical Exam: GEN: Up in bed with no distress PULM: Bilateral chest rise with no increased work of breathing CV: Tachycardic, no rub ABD: + PEG, mild distention EXT: Trace edema in the lower extremities, 1+ in the bilateral upper extremities SKIN: petechial nonblanching rash thighs, palms, arms NEURO: awake, alert, interactive Dialysis access: RIJ temp HD catheter  Imaging: DG Chest Port 1 View Result Date: 04/14/2024 CLINICAL DATA:  Pulmonary edema. EXAM: PORTABLE CHEST 1 VIEW COMPARISON:  Radiograph yesterday FINDINGS: Stable positioning of right-sided dialysis catheter and right upper extremity PICC. Slight improvement in heterogeneous interstitial opacities. Cardiomegaly is stable. Small pleural effusions are unchanged. Left chest wall  loop recorder. No pneumothorax. IMPRESSION: 1. Slight improvement in heterogeneous interstitial opacities, likely improving pulmonary edema. 2. Stable small pleural effusions. Electronically Signed   By: Andrea Gasman M.D.   On: 04/14/2024 16:42   DG CHEST PORT 1 VIEW Result Date: 04/13/2024 CLINICAL DATA:  Pneumonia. EXAM: PORTABLE CHEST 1 VIEW COMPARISON:  04/12/2024 FINDINGS: Patchy bilateral airspace disease is similar to prior. Tiny effusions again noted. The cardio pericardial silhouette is enlarged. Right IJ central line tip overlies the distal SVC level. Right PICC line tip is stable over the mid to distal SVC. IMPRESSION: No substantial interval change. Patchy bilateral airspace disease with tiny effusions. Electronically Signed   By: Camellia Candle M.D.   On: 04/13/2024 09:13     Labs: BMET Recent Labs  Lab 04/08/24 0444 04/09/24 0424 04/10/24 0055  04/11/24 1231 04/12/24 0247 04/13/24 0520 04/14/24 0500  NA 132* 130* 130* 133* 134* 131* 135  K 4.5 5.2* 4.5 5.6* 5.7* 4.8 4.1  CL 94* 94* 93* 95* 95* 93* 96*  CO2 26 24 26 24 27 28 30   GLUCOSE 131* 128* 172* 175* 152* 88 172*  BUN 89* 107* 73* 105* 111* 80* 65*  CREATININE 3.26* 4.12* 3.39* 4.39* 4.61* 3.37* 2.77*  CALCIUM  8.0* 8.0* 8.0* 8.2* 8.1* 7.8* 7.5*  PHOS 3.7 3.2 2.8 3.3 3.7 4.2 3.8   CBC Recent Labs  Lab 04/11/24 0322 04/12/24 0247 04/13/24 0520 04/14/24 0500  WBC 20.4* 20.3* 15.0* 13.6*  HGB 7.3* 7.6* 7.0* 6.3*  HCT 22.3* 23.4* 21.7* 19.2*  MCV 83.5 84.5 82.2 83.5  PLT 164 187 172 155    Medications:     atorvastatin   80 mg Per Tube Daily   carvedilol   3.125 mg Per Tube BID WC   Chlorhexidine  Gluconate Cloth  6 each Topical Daily   clonazePAM   0.5 mg Per Tube BID   ezetimibe   10 mg Per Tube Daily   famotidine   20 mg Per Tube Daily   feeding supplement (PROSource TF20)  60 mL Per Tube BID   fiber  1 packet Per Tube BID   [name] butt cream   Topical BID   guaiFENesin   10 mL Per Tube Q4H   insulin  aspart  0-9 Units Subcutaneous Q4H   lidocaine   1 patch Transdermal Q24H   multivitamin  1 tablet Per Tube QHS   pantoprazole  (PROTONIX ) IV  40 mg Intravenous Q12H   predniSONE   20 mg Oral Q breakfast   Followed by   NOREEN ON 04/21/2024] predniSONE   10 mg Oral Q breakfast   QUEtiapine   12.5 mg Per Tube QHS   saccharomyces boulardii  250 mg Per Tube BID   sodium chloride  flush  10-40 mL Intracatheter Q12H   torsemide   100 mg Oral Daily

## 2024-04-15 ENCOUNTER — Inpatient Hospital Stay (HOSPITAL_COMMUNITY)

## 2024-04-15 DIAGNOSIS — I10 Essential (primary) hypertension: Secondary | ICD-10-CM | POA: Diagnosis not present

## 2024-04-15 DIAGNOSIS — I4729 Other ventricular tachycardia: Secondary | ICD-10-CM

## 2024-04-15 DIAGNOSIS — R109 Unspecified abdominal pain: Secondary | ICD-10-CM | POA: Diagnosis not present

## 2024-04-15 DIAGNOSIS — K223 Perforation of esophagus: Secondary | ICD-10-CM | POA: Diagnosis not present

## 2024-04-15 LAB — TYPE AND SCREEN
ABO/RH(D): A POS
Antibody Screen: NEGATIVE
Unit division: 0

## 2024-04-15 LAB — BASIC METABOLIC PANEL WITH GFR
Anion gap: 12 (ref 5–15)
BUN: 91 mg/dL — ABNORMAL HIGH (ref 8–23)
CO2: 28 mmol/L (ref 22–32)
Calcium: 7.7 mg/dL — ABNORMAL LOW (ref 8.9–10.3)
Chloride: 93 mmol/L — ABNORMAL LOW (ref 98–111)
Creatinine, Ser: 3.49 mg/dL — ABNORMAL HIGH (ref 0.61–1.24)
GFR, Estimated: 18 mL/min — ABNORMAL LOW
Glucose, Bld: 256 mg/dL — ABNORMAL HIGH (ref 70–99)
Potassium: 4.8 mmol/L (ref 3.5–5.1)
Sodium: 133 mmol/L — ABNORMAL LOW (ref 135–145)

## 2024-04-15 LAB — GLUCOSE, CAPILLARY
Glucose-Capillary: 136 mg/dL — ABNORMAL HIGH (ref 70–99)
Glucose-Capillary: 136 mg/dL — ABNORMAL HIGH (ref 70–99)
Glucose-Capillary: 162 mg/dL — ABNORMAL HIGH (ref 70–99)
Glucose-Capillary: 176 mg/dL — ABNORMAL HIGH (ref 70–99)
Glucose-Capillary: 122 mg/dL — ABNORMAL HIGH (ref 70–99)
Glucose-Capillary: 146 mg/dL — ABNORMAL HIGH (ref 70–99)

## 2024-04-15 LAB — CBC
HCT: 23.7 % — ABNORMAL LOW (ref 39.0–52.0)
Hemoglobin: 7.7 g/dL — ABNORMAL LOW (ref 13.0–17.0)
MCH: 27.2 pg (ref 26.0–34.0)
MCHC: 32.5 g/dL (ref 30.0–36.0)
MCV: 83.7 fL (ref 80.0–100.0)
Platelets: 195 K/uL (ref 150–400)
RBC: 2.83 MIL/uL — ABNORMAL LOW (ref 4.22–5.81)
RDW: 16.9 % — ABNORMAL HIGH (ref 11.5–15.5)
WBC: 14.8 K/uL — ABNORMAL HIGH (ref 4.0–10.5)
nRBC: 0.1 % (ref 0.0–0.2)

## 2024-04-15 LAB — BPAM RBC
Blood Product Expiration Date: 202601192359
ISSUE DATE / TIME: 202512280929
Unit Type and Rh: 6200

## 2024-04-15 LAB — RENAL FUNCTION PANEL
Albumin: 2.1 g/dL — ABNORMAL LOW (ref 3.5–5.0)
Anion gap: 13 (ref 5–15)
BUN: 85 mg/dL — ABNORMAL HIGH (ref 8–23)
CO2: 28 mmol/L (ref 22–32)
Calcium: 7.8 mg/dL — ABNORMAL LOW (ref 8.9–10.3)
Chloride: 94 mmol/L — ABNORMAL LOW (ref 98–111)
Creatinine, Ser: 3.29 mg/dL — ABNORMAL HIGH (ref 0.61–1.24)
GFR, Estimated: 19 mL/min — ABNORMAL LOW
Glucose, Bld: 188 mg/dL — ABNORMAL HIGH (ref 70–99)
Phosphorus: 3.3 mg/dL (ref 2.5–4.6)
Potassium: 4.6 mmol/L (ref 3.5–5.1)
Sodium: 135 mmol/L (ref 135–145)

## 2024-04-15 LAB — ECHOCARDIOGRAM LIMITED
Height: 73 in
S' Lateral: 3.3 cm
Weight: 2680.79 [oz_av]

## 2024-04-15 LAB — LACTIC ACID, PLASMA: Lactic Acid, Venous: 1.3 mmol/L (ref 0.5–1.9)

## 2024-04-15 LAB — MAGNESIUM: Magnesium: 2.2 mg/dL (ref 1.7–2.4)

## 2024-04-15 LAB — HEPARIN LEVEL (UNFRACTIONATED): Heparin Unfractionated: <0.1 [IU]/mL — ABNORMAL LOW (ref 0.30–0.70)

## 2024-04-15 MED ORDER — FUROSEMIDE 10 MG/ML IJ SOLN
80.0000 mg | Freq: Once | INTRAMUSCULAR | Status: AC
  Administered 2024-04-15: 80 mg via INTRAVENOUS
  Filled 2024-04-15: qty 8

## 2024-04-15 MED ORDER — HEPARIN SODIUM (PORCINE) 1000 UNIT/ML IJ SOLN
INTRAMUSCULAR | Status: AC
  Filled 2024-04-15: qty 4

## 2024-04-15 MED ORDER — TORSEMIDE 20 MG PO TABS
100.0000 mg | ORAL_TABLET | Freq: Every day | ORAL | Status: DC
  Administered 2024-04-16 – 2024-04-26 (×10): 100 mg via ORAL
  Filled 2024-04-15 (×3): qty 1
  Filled 2024-04-15 (×3): qty 5
  Filled 2024-04-15 (×4): qty 1

## 2024-04-15 MED ORDER — HEPARIN SODIUM (PORCINE) 1000 UNIT/ML IJ SOLN
1000.0000 [IU] | Freq: Once | INTRAMUSCULAR | Status: AC
  Administered 2024-04-15: 3800 [IU] via INTRAVENOUS

## 2024-04-15 MED ORDER — HYDROMORPHONE HCL 1 MG/ML IJ SOLN
INTRAMUSCULAR | Status: AC
  Filled 2024-04-15: qty 0.5

## 2024-04-15 NOTE — Plan of Care (Signed)
   Problem: Clinical Measurements: Goal: Respiratory complications will improve Outcome: Progressing   Problem: Safety: Goal: Ability to remain free from injury will improve Outcome: Progressing

## 2024-04-15 NOTE — Progress Notes (Signed)
 Pt. With elevated BP, diaphragmatic breathing, RR 38.  HR 120s-130s,  O2 81%-95% on 5L 02, frequent runs of VTach per CCMD. Lung sounds diminished with wheezing and fine crackles noted. On call for [name] paged to make aware.

## 2024-04-15 NOTE — Progress Notes (Signed)
 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  He has completed rounds of antibiotics.  Mild interstitial fibrosis/tubular atrophy.  - Given concern for infection we provided steroids but avoided other IS medications due to weighing risks/benefits - on pred taper now with plans to wean quickly given the main concern is infection as a cause -he was on minoxidil  which caries similar risk for AI kidney phenomena as hydralazine . Stopped this (last dose 12/22)  -some signs of renal recovery with nonoliguric increasing urine output; BUN and creatinine slightly increasing -did receive contrast on 12/21 which may hinder Crt improvement - HD today given worsening volume status, attempt 3 L UF using HD catheter without heparin .   Petechial rash;             - 2/2 Iga. Mgmt as above   Acute on chronic CHF exacerbation;             - HD and diuretics as above, additional IV Lasix  today, on daily torsemide    H/o DVT and PE:             - Anticoagulation per cardiology and primary service   Boorehave's syndrome             - s/p repair             - Has a PEG  - EGD 03/19/24 with ulcers  - PPI and pepcid    HTN  - Carvedilol , followed BPs with UF  Dispo: inpt   Subjective:   Worsening oxygen  requirement overnight after transfusion yesterday BUN and creatinine slightly increased, K4.6 0.9 L UOP   Objective:   BP (!) 162/58 (BP Location: Left Arm)   Pulse 96   Temp 97.7 F (36.5 C) (Oral)   Resp (!) 33   Ht 6' 1 (1.854 m)   Wt 76 kg   SpO2 97%   BMI 22.11 kg/m   Intake/Output Summary  (Last 24 hours) at 04/15/2024 9188 Last data filed at 04/15/2024 0600 Gross per 24 hour  Intake 7810.57 ml  Output 810 ml  Net 7000.57 ml    Weight change: -1.4 kg  Physical Exam: GEN: Lying in bed, mild increased work of breathing/respiratory rate PULM: As above, diminished in bases CV: Normal rate, regular rhythm ABD: + PEG, mild distention EXT: 1+  edema in the lower extremities, 1+ in the bilateral upper extremities NEURO: awake, alert, interactive Dialysis access: RIJ HD catheter  Imaging: DG CHEST PORT 1 VIEW Result Date: 04/15/2024 EXAM: 1 VIEW(S) XRAY OF THE CHEST 04/15/2024 07:56:00 AM COMPARISON: 04/14/2024. CLINICAL HISTORY: Acute respiratory distress. FINDINGS: LINES, TUBES AND DEVICES: Right arm PICC line tip is in the projection of the SVC. There is also a right IJ dialysis catheter with tip at the superior cavoatrial junction. Loop recorder is noted in the projection of the left heart border. LUNGS AND PLEURA: Unchanged small bilateral pleural effusions. Persistent bilateral interstitial and airspace opacities. No pneumothorax. HEART AND MEDIASTINUM: No acute abnormality of the cardiac and mediastinal silhouettes. BONES AND SOFT TISSUES: No acute osseous abnormality. IMPRESSION:  1. Persistent bilateral interstitial and airspace opacities. 2. Unchanged small bilateral pleural effusions. Electronically signed by: Waddell Calk MD 04/15/2024 08:07 AM EST RP Workstation: [id number]   DG Chest Port 1 View Result Date: 04/14/2024 CLINICAL DATA:  Pulmonary edema. EXAM: PORTABLE CHEST 1 VIEW COMPARISON:  Radiograph yesterday FINDINGS: Stable positioning of right-sided dialysis catheter and right upper extremity PICC. Slight improvement in heterogeneous interstitial opacities. Cardiomegaly is stable. Small pleural effusions are unchanged. Left chest wall loop recorder. No pneumothorax. IMPRESSION: 1. Slight improvement in heterogeneous interstitial opacities, likely improving  pulmonary edema. 2. Stable small pleural effusions. Electronically Signed   By: Andrea Gasman M.D.   On: 04/14/2024 16:42     Labs: BMET Recent Labs  Lab 04/09/24 0424 04/10/24 0055 04/11/24 1231 04/12/24 0247 04/13/24 0520 04/14/24 0500 04/14/24 1455 04/15/24 0628  NA 130* 130* 133* 134* 131* 135 132* 135  K 5.2* 4.5 5.6* 5.7* 4.8 4.1 4.7 4.6  CL 94* 93* 95* 95* 93* 96* 94* 94*  CO2 24 26 24 27 28 30 28 28   GLUCOSE 128* 172* 175* 152* 88 172* 205* 188*  BUN 107* 73* 105* 111* 80* 65* 77* 85*  CREATININE 4.12* 3.39* 4.39* 4.61* 3.37* 2.77* 3.13* 3.29*  CALCIUM  8.0* 8.0* 8.2* 8.1* 7.8* 7.5* 7.9* 7.8*  PHOS 3.2 2.8 3.3 3.7 4.2 3.8  --  3.3   CBC Recent Labs  Lab 04/12/24 0247 04/13/24 0520 04/14/24 0500 04/14/24 1455  WBC 20.3* 15.0* 13.6* 12.5*  NEUTROABS  --   --   --  11.4*  HGB 7.6* 7.0* 6.3* 9.8*  HCT 23.4* 21.7* 19.2* 29.9*  MCV 84.5 82.2 83.5 84.2  PLT 187 172 155 177    Medications:     atorvastatin   80 mg Per Tube Daily   carvedilol   3.125 mg Per Tube BID WC   Chlorhexidine  Gluconate Cloth  6 each Topical Daily   clonazePAM   0.5 mg Per Tube BID   ezetimibe   10 mg Per Tube Daily   famotidine   20 mg Per Tube Daily   feeding supplement (PROSource TF20)  60 mL Per Tube BID   fiber  1 packet Per Tube BID   [name] butt cream   Topical BID   guaiFENesin   10 mL Per Tube Q4H   insulin  aspart  0-9 Units Subcutaneous Q4H   lidocaine   1 patch Transdermal Q24H   mexiletine  200 mg Oral Q12H   multivitamin  1 tablet Per Tube QHS   pantoprazole  (PROTONIX ) IV  40 mg Intravenous Q12H   predniSONE   20 mg Oral Q breakfast   Followed by   NOREEN ON 04/21/2024] predniSONE   10 mg Oral Q breakfast   QUEtiapine   12.5 mg Per Tube QHS   saccharomyces boulardii  250 mg Per Tube BID   sodium chloride  flush  10-40 mL Intracatheter Q12H   [START ON 04/16/2024] torsemide   100 mg Oral Daily

## 2024-04-15 NOTE — Progress Notes (Cosign Needed Addendum)
 Telemetry review: AFib 3w/CVR ~ 80's This morning, few NSVTs  2 episodes of sustained MMVT Each lasting ~ 50 seconds  ~ 130-150bpm, slightly irregular  Increased WOB this AM reported by notes/RN, to me, he denies feeling any more/less SOB this morning then he has in the last few days CXR this AM IMPRESSION: 1. Persistent bilateral interstitial and airspace opacities. 2. Unchanged small bilateral pleural effusions.  Was given lasix  this am In d/w nurse, she reports Nephrology has been bedside already this morning > planned for HD   He denies any CP, no cardiac awareness, not dizzy, no near syncope, syncope Sats are better > 9L HFNC On return to the room, he does look more comfortable Gets very anxious, is very fixated on getting cleaned up and being tossed around  too much  Continue amiodarone  gtt today, also scheduled to start mexiletine today. K+ 4.6 Mag 2.1 BUN/Creat 85/3.29 No CBC Note Dr. Garnette discussion yesterday regarding +/- ICD and timing   Discussed with Dr. Almetta, Repeat labs this afternoon Limited echo Resume anticoagulation as per attending team when felt safe to do so  Charlies Arthur, PA-C

## 2024-04-15 NOTE — Plan of Care (Signed)
" °  Problem: Nutrition: Goal: Adequate nutrition will be maintained Outcome: Progressing Feeding tube Vital Feeding   Problem: Activity: Goal: Risk for activity intolerance will decrease Outcome: Progressing  Bed bound   Problem: Clinical Measurements: Goal: Cardiovascular complication will be avoided Outcome: Progressing  Swelling with IV lasix  and Dialysis     Problem: Clinical Measurements: Goal: Respiratory complications will improve Outcome: Progressing   "

## 2024-04-15 NOTE — Significant Event (Incomplete)
 Billy [name]

## 2024-04-15 NOTE — TOC Progression Note (Addendum)
 Transition of Care [facility]) - Progression Note    Patient Details  Name: Billy [name] MRN: [id number] Date of Birth: 10/20/1950  Transition of Care [facility]) CM/SW Contact  Billy [name], [place] Phone Number: 04/15/2024, 9:00 AM  Clinical Narrative:   CSW followed up with Renal Navigator and Nephrology about patient being CLIPPED for outpt HD. Renal Navigator needing to know if [name] can provide transport 4x week to HD before sending referrals. CSW inquired about transport to HD with [name], AD at [facility]. CSW awaiting response.   12:03 PM Per [name] with [name], facility can accommodate for 4 days of HD ([facility]). CSW notified Renal Navigator.  CSW to start auth once updated PT note is in and patient is more medically stable.  CSW will continue to follow.    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, SNF Pending bed offer               Expected Discharge Plan and Services In-house Referral: Clinical Social Work Discharge Planning Services: [employer] Consult Post Acute Care Choice: Skilled Nursing Facility Living arrangements for the past 2 months: Single Family Home                                       Social Drivers of Health (SDOH) Interventions SDOH Screenings   Food Insecurity: No Food Insecurity (01/22/2024)  Housing: High Risk (01/22/2024)  Transportation Needs: No Transportation Needs (01/22/2024)  Utilities: Not At Risk (01/22/2024)  Social Connections: Socially Isolated (01/22/2024)  Tobacco Use: Low Risk (03/19/2024)    Readmission Risk Interventions    06/27/2023    1:52 PM 10/14/2022   11:29 AM  Readmission Risk Prevention Plan  Post Dischage Appt  Complete  Medication Screening  Complete  Transportation Screening Complete Complete  HRI or Home Care Consult Complete   Social Work Consult for Recovery Care Planning/Counseling Complete   Palliative Care Screening Not  Applicable   Medication Review Oceanographer) Referral to Pharmacy

## 2024-04-15 NOTE — Progress Notes (Signed)
 " Progress Note   Patient: Billy [name] FMW:[id number] DOB: 1950-06-04 DOA: 01/21/2024     85 DOS: the patient was seen and examined on 04/15/2024   Brief hospital course: 73 year old male Prolonged admission 3/10-07/20/18/2025 cardiac arrest requiring ICU management cardioverted for A-fib-found to have DVTs then had hematochezia necessitating IVC filter-recurrent GI bleed secondary to erosive gastropathy and friable duodenal mucosa and colonoscopy was deferred and then chest tube placements cardiac cath at that time showed CAD with CTO of RCA and was treated with diuretics and sent home on torsemide  Entresto  Jardiance -   Readmitted 10/5 with nausea vomiting sudden tearing sensation found to have an esophageal tear  10/7 esophageal stent placed by cardiothoracic 10/15 stent reposition 10/16 extubated 10/20 reintubated with ultrasound showing left-sided pleural effusion-left-sided chest tube placed-by lights criteria exudative effusion 10/23 echo 60-65% 10/24 barium swallow showed persistent leak 10/28 transferred out of ICU 11/3 PEG tube placed 11/19 swelling of right arm showing on ultrasound SVT cephalic vein 11/22 chest x-ray?  Pneumonia done because of back pain-follow-up CT scan shows stent in place no mediastinal fluid collection small bilateral pleural effusions with bibasilar atelectasis pneumonia not excluded-scattered clusters nodular densities predominantly right upper and right lower lobes with nodule 11 mm as previously seen?  Mets?  Infection?  Aspiration 11/26 stent removed. Esophagram negative for esophageal leak.  11/27 getting diuresis again 11/28 duodenitis/enteritis.  GI consulted. Bloody emesis added carafate   11/29 trialing clears 12/1 NV again w/ hematemesis. Lasix  held ---  esophagram performed showing patent esophagus without perforation irregularity mid esophagus?  Stent diffuse narrowing lumen distal duodenum and proximal jejunum 12/2 EGD ischemic ulcers. Impression:   - Congested, inflamed, nodular, texture changed  mucosa in the esophagus. - Gastritis, Non-bleeding duodenal ulcers with a clean ulcer  base (Forrest Class III).- Non-bleeding jejunal ulcers with a clean ulcer   base (Forrest Class III)  - Findings suggestive of ischemic ulcers. Increased carafate  and cont BID PPI  12/3 full liq diet  12/4 marked increased WOB. Acute and progressive w/ increased hypoxia and HTN. PCCM asked to see  12/10 again increased WOB, stubborn to diuresis, unable to oxygenate, to ICU--- azotemia and renal consulted 12/11 Intubated  12/12 extubated 12/13 c/o of epigastric pain but suspect more anxiety component, on cleviprex , CRRT for volume removal 12/15 pain improved, on RA, on clev and CRRT, renal bx pending 12/16 renal biopsy performed showing 12/17 off clev transition to iHD, transfer to floor  12/18: TDC placed 12/19 [name] assumed care, has distended and painful abdomen today, KUB shows improvement so tube feeds resumed, Eliquis  resumed 12/22 hemoccult neg---transfuse 1 U--- Eliquis  had to be held in the setting 12/23 vascular consulted secondary to lower extremities--- sleepy this morning-x-ray shows congestion no pneumonia white count up-- 12/24 ABIs showed abnormal right and left toe brachial indices-no intervention from vascular required 12/26 severe respiratory distress needing nonrebreather   Assessment and Plan: Iatrogenic volume overload 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 as per renal not really candidate for alternate agents Hyperkalemia Stop Lokelma  defer further management hyperkalemia to renal IHD with [facility] 12/18 placed Note events from 12/26-transferred to progressive unit heated high flow after dialysis yesterday and still quite volume overloaded Overnight events noted. Increased O2 requirements following 1 unit PRBC. Pt underwent HD today after receiving additional IV  lasix     Nonspecific leukocytosis afebrile   Boerhaave syndrome,--- events as above-- moderate to severe abdominal pain Ischemic ulcer disease--GI and cardiothoracic aware-several  CTs since 11/25---CT scan done 12/21 unremarkable-- NO Carafate  with HD, stop Maalox Last esophagram as above 12/1 Pantoprazole  40 IV twice daily, Pepcid  20 daily Severe Malnutrition-BMI 22 evidence of weight loss S/p PEG Tolerating TF. Encourage po as tolerated   Normocytic anemia secondary to peptic ulcer disease,?  Ongoing gastritis as above Transfused PRBC x 1u in October, x1 unit 03/29/2024,12/23, 12/28 No obvious blood loss at this time, f/u on hemeoccult    Cardiac arrest 06/2023 with HF MR EF 55-65% 03/27/2024 A-fib RVR CHADVASC >4 on Eliquis  [cardioverted 06/2023]--- recurrent RVR at times Eliquis  has been held since 12/22 --- on heparin  now watch closely cardiology consulted 12/21-signed off-patient continues Amio 200 twice daily --continue Coreg  6.25 twice daily for recurrent A-fib RVR RVR Entresto  and torsemide  forever held given current issues  DVT PE March 2025 does have IVC filter in place since March Anticoagulation resumed cautiously 12/25 ?  PAD with mottling of skin on removal of Unna boot from pictures in 12/22 note  Lower limb ischemia ruled out 12.23 ABI rules out ischemia-appreciate VVS--no intervention needed   Insomnia May continue trazodone  50-? Seroquel  to 12.5 at bedtime---? hydroxyzine  to 25 3 times daily given aninxety   Diabetes CBG 108-160-only every 4 coverage   Multiple bedsores and wounds anterior right and left foot and injury stage I to bottom Continue Gerhardt twice daily Lidoderm  patch for back pain---Bed sore last examined 12/22 and looks good Schedule Lomotil  1 tablet twice daily, can use as needed Lomotil   4 times daily in addition  VT - Noted overnight and recurrent run today - Cardiology reconsulted. Orders for IV amio noted per Cardiology     Resolved/stable problems Petechial rash Hydralazine  and Unasyn  have been stopped, cannot rule out HSP-this seems unlikely to me however he has mottling in general over his skin-see the pictures from 12/22 of his feet-seem stable to me Acute hypoxic respiratory failure//Aspiration Pneumonia//Pleural effusion//Acute on chronic diastolic CHF exacerbation ?respiratory failure due to pneumonia, pleural effusion, CHF exacerbation -- Better c Lasix  and antibiotics. Intemrittently seems to have this Bilateral UE swelling--duplex upper extremities ruled out DVT Lung nodules-repeat CT in Jan/Feb window        Subjective: Overnight with increased O2 requirements and sob following 1 unit PRBC transfusion  Physical Exam: Vitals:   04/15/24 1628 04/15/24 1700 04/15/24 1800 04/15/24 1822  BP: 122/71 (!) 155/129    Pulse: 98 83  84  Resp: 17 (!) 29  19  Temp: (!) 97.2 F (36.2 C) 98.3 F (36.8 C)    TempSrc:    Oral  SpO2: 100% 94% 100%   Weight: 76.9 kg     Height:       General exam: Conversant, in no acute distress when seen Respiratory system: normal chest rise, scattered crackles Cardiovascular system: regular rhythm, s1-s2 Gastrointestinal system: Nondistended, nontender, pos BS Central nervous system: No seizures, no tremors Extremities: No cyanosis, no joint deformities Skin: No rashes, no pallor Psychiatry: Affect normal // mood seems normal  Data Reviewed:  Labs reviewed: Na 133, K 4.8, Cr 3.49, WBC 14.8, Hgb 7.7, Plts 195  Family Communication: Pt in room, family not at bedside  Disposition: Status is: Inpatient Remains inpatient appropriate because: severity of illness  Planned Discharge Destination: LTC    Author: Garnette Pelt, MD 04/15/2024 6:50 PM  For on call review [email or web address].  "

## 2024-04-15 NOTE — Progress Notes (Signed)
" °   04/15/24 0720  Vitals  Temp 97.7 F (36.5 C)  Temp Source Oral  BP (!) 162/58  MAP (mmHg) 83  BP Location Left Arm  BP Method Automatic  Patient Position (if appropriate) Lying  Pulse Rate 96  Pulse Rate Source Monitor  ECG Heart Rate 94  Resp (!) 33  Level of Consciousness  Level of Consciousness Alert  MEWS COLOR  MEWS Score Color Yellow  Oxygen  Therapy  SpO2 97 %  O2 Device HFNC  O2 Flow Rate (L/min) 9 L/min  Pain Assessment  Pain Scale 0-10  Pain Type Acute pain  Pain Location Abdomen  Pain Orientation Anterior  Pain Descriptors / Indicators Aching  Pain Frequency Constant  Pain Onset On-going  Pain Intervention(s) Medication (See eMAR);Repositioned  Multiple Pain Sites No  PCA/Epidural/Spinal Assessment  Respiratory Pattern Irregular;Dyspnea at rest  Glasgow Coma Scale  Eye Opening 4  Best Verbal Response (NON-intubated) 5  Best Motor Response 6  Glasgow Coma Scale Score 15  MEWS Score  MEWS Temp 0  MEWS Systolic 0  MEWS Pulse 0  MEWS RR 2  MEWS LOC 0  MEWS Score 2   MD Made aware on Night Shift and Day Shift Dr Stephany ordered 80mg  of lasix , HF 02 was applied at 9L,  "

## 2024-04-15 NOTE — Progress Notes (Signed)
 Contacted by nephrologist to [facility] for out-pt HD. Will plan to meet with pt once back in room either later today or tomorrow to discuss. Pt will be going to [facility], they will transport regular and TCU schedules.   Lavanda Westlynn Fifer Dialysis Navigator [phone]

## 2024-04-16 ENCOUNTER — Inpatient Hospital Stay (HOSPITAL_COMMUNITY)

## 2024-04-16 DIAGNOSIS — R109 Unspecified abdominal pain: Secondary | ICD-10-CM | POA: Diagnosis not present

## 2024-04-16 DIAGNOSIS — K223 Perforation of esophagus: Secondary | ICD-10-CM | POA: Diagnosis not present

## 2024-04-16 LAB — COMPREHENSIVE METABOLIC PANEL WITH GFR
ALT: 356 U/L — ABNORMAL HIGH (ref 0–44)
AST: 249 U/L — ABNORMAL HIGH (ref 15–41)
Albumin: 2 g/dL — ABNORMAL LOW (ref 3.5–5.0)
Alkaline Phosphatase: 1116 U/L — ABNORMAL HIGH (ref 38–126)
Anion gap: 9 (ref 5–15)
BUN: 62 mg/dL — ABNORMAL HIGH (ref 8–23)
CO2: 30 mmol/L (ref 22–32)
Calcium: 7.8 mg/dL — ABNORMAL LOW (ref 8.9–10.3)
Chloride: 96 mmol/L — ABNORMAL LOW (ref 98–111)
Creatinine, Ser: 2.58 mg/dL — ABNORMAL HIGH (ref 0.61–1.24)
GFR, Estimated: 26 mL/min — ABNORMAL LOW
Glucose, Bld: 159 mg/dL — ABNORMAL HIGH (ref 70–99)
Potassium: 4.2 mmol/L (ref 3.5–5.1)
Sodium: 135 mmol/L (ref 135–145)
Total Bilirubin: 1.2 mg/dL (ref 0.0–1.2)
Total Protein: 4.8 g/dL — ABNORMAL LOW (ref 6.5–8.1)

## 2024-04-16 LAB — CBC
HCT: 23.3 % — ABNORMAL LOW (ref 39.0–52.0)
Hemoglobin: 7.7 g/dL — ABNORMAL LOW (ref 13.0–17.0)
MCH: 27.6 pg (ref 26.0–34.0)
MCHC: 33 g/dL (ref 30.0–36.0)
MCV: 83.5 fL (ref 80.0–100.0)
Platelets: 186 K/uL (ref 150–400)
RBC: 2.79 MIL/uL — ABNORMAL LOW (ref 4.22–5.81)
RDW: 17.4 % — ABNORMAL HIGH (ref 11.5–15.5)
WBC: 11.5 K/uL — ABNORMAL HIGH (ref 4.0–10.5)
nRBC: 0 % (ref 0.0–0.2)

## 2024-04-16 LAB — HEPARIN LEVEL (UNFRACTIONATED): Heparin Unfractionated: <0.1 [IU]/mL — ABNORMAL LOW (ref 0.30–0.70)

## 2024-04-16 LAB — GLUCOSE, CAPILLARY
Glucose-Capillary: 124 mg/dL — ABNORMAL HIGH (ref 70–99)
Glucose-Capillary: 143 mg/dL — ABNORMAL HIGH (ref 70–99)
Glucose-Capillary: 147 mg/dL — ABNORMAL HIGH (ref 70–99)
Glucose-Capillary: 148 mg/dL — ABNORMAL HIGH (ref 70–99)
Glucose-Capillary: 155 mg/dL — ABNORMAL HIGH (ref 70–99)
Glucose-Capillary: 166 mg/dL — ABNORMAL HIGH (ref 70–99)

## 2024-04-16 MED ORDER — PROSOURCE TF20 ENFIT COMPATIBL EN LIQD
60.0000 mL | Freq: Every day | ENTERAL | Status: DC
  Administered 2024-04-17 – 2024-04-18 (×2): 60 mL
  Filled 2024-04-16 (×2): qty 60

## 2024-04-16 NOTE — Progress Notes (Signed)
 Radiology confirmed patient is to be NPO for US  abdomen. RN notifed MD and RN stopped tube feedings at 1018. Radiology notified of tube feeding stopped time.

## 2024-04-16 NOTE — Progress Notes (Addendum)
 Nutrition Follow-up  DOCUMENTATION CODES:   Severe malnutrition in context of acute illness/injury  INTERVENTION:  Continue Tube Feeding via PEG to meet full nutrition needs:  Vital 1.5 at 65 ml/hr (1560 ml per day) Reduce Pro-Source TF20 60 mL once daily Continue Nutrisource Fiber BID TF at goal rate provides 125 g of protein, 2420 kcals, 1192 mL of free water  over 24 hours   Monitor PO intake and tolerance of PO Pt has hx of not tolerating PO diet, but continues to tolerate PEG feeds Appears to be tolerating mechanical soft diet    If feasible, consider addition of ESA to avoid recurrent transfusions and complications r/t this  NUTRITION DIAGNOSIS:  Severe Malnutrition related to acute illness (may have chronic component due to time frame of reported wt loss) as evidenced by severe fat depletion, moderate muscle depletion.  GOAL:  Patient will meet greater than or equal to 90% of their needs  MONITOR:  PO intake, Supplement acceptance, TF tolerance, Labs, I & O's  REASON FOR ASSESSMENT:   Consult Enteral/tube feeding initiation and management  ASSESSMENT:   73 yo male admitted post several days of N/V followed by sudden onset of severe epigastric pain radiating to chest and back. Pt found to have small esophageal perforation above GE junction. PMH includes CAD, chronic HFpEF, HTN, HLD, DVT, CVA, PAF, CKD 3b.  12/15 Diet advanced to Renal  12/16 CRRT discontinued, Renal Biopsy performed 12/18 adv to regular diet 12/19 made NPO 12/22 adv to DYS 3 diet; 1 unit PRBCs transfused 12/26: respiratory distress  12/29: 1 unit PRBCs transfused; echo: EF 60-65% 12/26: transferred to progressive unit d/t need for [facility] s/p transfusion  Transferred to progressive care on 12/26 due to increased O2 requirements. Increased O2 requirements overnight following 1 unit transfusion of PRBCs. Remains volume overloaded. Patient currently being CLIPPED for OP HD. Will need HD 4x weekly.    Average Meal Intake 12/28: 0% x3 documented meals 12/29: 0% x2 documented meals 12/30: 0% x1 documented meal   Diet downgraded to mechanical soft on 12/22 due to intolerance with PO intake with recent hx of severe gastritis. Suspect MD may have meant GI soft diet, however DYS3/thin liquids continues. Per documentation, patient not eating much by mouth. He is gives number of reasons for this at bedside today ranging from dislike of the hospital food to fear of intolerance. TF continues to infuse at goal rate and he is tolerating. Currently NPO due to pending abdominal ultrasound. Abdomen somewhat taught, however this has been his baseline throughout admission.      Diarrhea has improved. Noted with 2 BMs yesterday. Nutrisource Fiber BID continues. He endorses no N/V.   Needs re-estimated a last estimation was due to patient initiating CRRT. Reduced Prosource offerings to once daily.   Admit Weight: 72.6 kg Current Weight: 76.1 kg Lowest Weight: 66.8 kg (11/06) Last HD tx: 12/29 UF achieved: 2L  Required additional Lasix  yesterday in addition to daily torsemide  Attempted to challenge patient to 3L fluid pull. Achieved 2L pull. Continues with some edema, however overall improved to previously observed.   Drains/Lines: R brachial: PICC, triple lumen (placed 10/20) R internal jugular: hemodialysis catheter, double lumen (tunneled) LUQ: PEG (24Fr) placed 11/03 Foley catheter UOP: x24 hours    Micronutrient Labs (12/05): CRP 10.8 (H) Vitamin K 0.49 (wdl)  Vitamin C 0.6 (wdl)  Electrolytes stable. No recent repletion required. Has received multiple transfusions, but no ESA noted. May benefit from once weekly ESA to avoid  need for recurrent transfusions.    Labs: Na+ 135 (wdl) Potassium 4.2 (wdl) Phosphorus 3.3 (12/29) Magnesium  2.2 (12/29) Crt 2.58  BUN 62 WBC 14.8>11.5 (H) Hgb 6.3>9.8>7.7 (L) CBGs 122-176 x24 hours A1c 6.0 (01/2024)   Meds: Famotidine  Nutrisource  Fiber SS Novolog  Renal MVI Pantoprazole  Prednisone  Florastor Torsemide  Drips: Amio gtt  NUTRITION - FOCUSED PHYSICAL EXAM:  Flowsheet Row Most Recent Value  Orbital Region Moderate depletion  Upper Arm Region Severe depletion  Thoracic and Lumbar Region Severe depletion  Buccal Region Moderate depletion  Temple Region Severe depletion  Clavicle Bone Region Moderate depletion  Clavicle and Acromion Bone Region Severe depletion  Scapular Bone Region Moderate depletion  Dorsal Hand Mild depletion  Patellar Region Moderate depletion  Anterior Thigh Region Severe depletion  Posterior Calf Region Severe depletion  Edema (RD Assessment) Mild  Hair Reviewed  Eyes Reviewed  Mouth Reviewed  Skin Reviewed  Nails Reviewed    Diet Order:   Diet Order             DIET DYS 3 Room service appropriate? Yes; Fluid consistency: Thin  Diet effective now                   EDUCATION NEEDS:   Education needs have been addressed  Skin:  Skin Assessment: Reviewed RN Assessment Skin Integrity Issues:: Stage I Stage I: n/a  Last BM:  12/29 - type 6/7 x2  Height:  Ht Readings from Last 1 Encounters:  04/16/24 6' 1 (1.854 m)   Weight:  Wt Readings from Last 1 Encounters:  04/16/24 76.1 kg   Ideal Body Weight:  83.6 kg  BMI:  Body mass index is 22.13 kg/m.  Estimated Nutritional Needs:   Kcal:  2300-2500 kcals  Protein:  110-130g  Fluid:  1L + UOP  Blair Deaner MS, RD, LDN Registered Dietitian I Clinical Nutrition RD Inpatient Contact Info in Amion

## 2024-04-16 NOTE — Progress Notes (Signed)
 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  He has completed rounds of antibiotics.  Mild interstitial fibrosis/tubular atrophy.  - Given concern for infection we provided steroids but avoided other IS medications due to weighing risks/benefits - on pred taper now with plans to wean quickly given the main concern is infection as a cause -he was on minoxidil  which caries similar risk for AI kidney phenomena as hydralazine . Stopped this (last dose 12/22)  -some signs of renal recovery with nonoliguric increasing urine output; BUN and creatinine slightly increasing -did receive contrast on 12/21 which may hinder Crt improvement - Plan for HD tomorrow, will check with MD first to make sure not sensible to hold allowing further investigation of GFR recovery   Petechial rash;             - 2/2 Iga. Mgmt as above   Acute on chronic CHF exacerbation;             - HD and diuretics as aboven daily torsemide    H/o DVT and PE:             - Anticoagulation per cardiology and primary service   Boorehave's syndrome             - s/p repair             - Has a PEG  - EGD 03/19/24 with ulcers  - PPI and pepcid    HTN  - Carvedilol , followed BPs with UF  Dispo: inpt   Subjective:   HD yesterday with 2 L UF 0.25 L UOP reported yesterday   Objective:   BP (!) 108/54 (BP Location: Left Arm)   Pulse 83   Temp 98.2 F (36.8 C) (Oral)   Resp 20   Ht 6' 1 (1.854 m)   Wt 76.1 kg   SpO2 100%   BMI 22.13 kg/m   Intake/Output Summary (Last 24 hours) at 04/16/2024 1214 Last data filed at  04/16/2024 0800 Gross per 24 hour  Intake 97.77 ml  Output 2200 ml  Net -2102.23 ml    Weight change: 2.3 kg  Physical Exam: GEN: Lying in bed, mild increased work of breathing/respiratory rate PULM: As above, diminished in bases CV: Normal rate, regular rhythm ABD: + PEG, mild distention EXT: 1+  edema in the lower extremities, 1+ in the bilateral upper extremities NEURO: awake, alert, interactive Dialysis access: RIJ HD catheter  Imaging: ECHOCARDIOGRAM LIMITED Result Date: 04/15/2024    ECHOCARDIOGRAM LIMITED REPORT   Patient Name:   Billy [name] Date of Exam: 04/15/2024 Medical Rec #:  [id number]  Height:       73.0 in Accession #:    [id number] Weight:       167.5 lb Date of Birth:  July 30, 1950  BSA:          1.996 m Patient Age:    73 years   BP:           161/70 mmHg Patient Gender: M          HR:  83 bpm. Exam Location:  Inpatient Procedure: Limited Echo Indications:    Ventricular Tachycardia I47.2  History:        Patient has prior history of Echocardiogram examinations, most                 recent 03/27/2024. Arrythmias:Atrial Fibrillation; Risk                 Factors:Hypertension.  Sonographer:    Jayson Gaskins Referring Phys: [id number] RENEE LYNN URSUY IMPRESSIONS  1. Left ventricular ejection fraction, by estimation, is 60 to 65%. The left ventricle has normal function. There is mild concentric left ventricular hypertrophy. Left ventricular diastolic function could not be evaluated.  2. Right ventricular systolic function is normal. The right ventricular size is normal.  3. The mitral valve is normal in structure. No evidence of mitral valve regurgitation.  4. The aortic valve is tricuspid. Aortic valve regurgitation is not visualized. Aortic valve sclerosis/calcification is present, without any evidence of aortic stenosis.  5. The inferior vena cava is normal in size with greater than 50% respiratory variability, suggesting right atrial pressure of 3 mmHg. FINDINGS  Left  Ventricle: Left ventricular ejection fraction, by estimation, is 60 to 65%. The left ventricle has normal function. There is mild concentric left ventricular hypertrophy. Left ventricular diastolic function could not be evaluated. Left ventricular diastolic function could not be evaluated due to nondiagnostic images. Right Ventricle: The right ventricular size is normal. Right ventricular systolic function is normal. Pericardium: There is no evidence of pericardial effusion. Mitral Valve: The mitral valve is normal in structure. Aortic Valve: The aortic valve is tricuspid. Aortic valve regurgitation is not visualized. Aortic valve sclerosis/calcification is present, without any evidence of aortic stenosis. Aorta: The aortic root is normal in size and structure. Venous: The inferior vena cava is normal in size with greater than 50% respiratory variability, suggesting right atrial pressure of 3 mmHg. LEFT VENTRICLE PLAX 2D LVIDd:         4.60 cm LVIDs:         3.30 cm LV PW:         1.20 cm LV IVS:        1.20 cm LVOT diam:     2.10 cm LVOT Area:     3.46 cm   SHUNTS Systemic Diam: 2.10 cm Morene Brownie Electronically signed by Morene Brownie Signature Date/Time: 04/15/2024/5:56:35 PM    Final    DG CHEST PORT 1 VIEW Result Date: 04/15/2024 EXAM: 1 VIEW(S) XRAY OF THE CHEST 04/15/2024 07:56:00 AM COMPARISON: 04/14/2024. CLINICAL HISTORY: Acute respiratory distress. FINDINGS: LINES, TUBES AND DEVICES: Right arm PICC line tip is in the projection of the SVC. There is also a right IJ dialysis catheter with tip at the superior cavoatrial junction. Loop recorder is noted in the projection of the left heart border. LUNGS AND PLEURA: Unchanged small bilateral pleural effusions. Persistent bilateral interstitial and airspace opacities. No pneumothorax. HEART AND MEDIASTINUM: No acute abnormality of the cardiac and mediastinal silhouettes. BONES AND SOFT TISSUES: No acute osseous abnormality. IMPRESSION: 1. Persistent  bilateral interstitial and airspace opacities. 2. Unchanged small bilateral pleural effusions. Electronically signed by: Waddell Calk MD 04/15/2024 08:07 AM EST RP Workstation: [id number]     Labs: BMET Recent Labs  Lab 04/10/24 0055 04/11/24 1231 04/12/24 0247 04/13/24 0520 04/14/24 0500 04/14/24 1455 04/15/24 0628 04/15/24 1150 04/16/24 0603  NA 130* 133* 134* 131* 135 132* 135 133* 135  K 4.5 5.6* 5.7* 4.8 4.1 4.7 4.6  4.8 4.2  CL 93* 95* 95* 93* 96* 94* 94* 93* 96*  CO2 26 24 27 28 30 28 28 28 30   GLUCOSE 172* 175* 152* 88 172* 205* 188* 256* 159*  BUN 73* 105* 111* 80* 65* 77* 85* 91* 62*  CREATININE 3.39* 4.39* 4.61* 3.37* 2.77* 3.13* 3.29* 3.49* 2.58*  CALCIUM  8.0* 8.2* 8.1* 7.8* 7.5* 7.9* 7.8* 7.7* 7.8*  PHOS 2.8 3.3 3.7 4.2 3.8  --  3.3  --   --    CBC Recent Labs  Lab 04/14/24 0500 04/14/24 1455 04/15/24 1150 04/16/24 0603  WBC 13.6* 12.5* 14.8* 11.5*  NEUTROABS  --  11.4*  --   --   HGB 6.3* 9.8* 7.7* 7.7*  HCT 19.2* 29.9* 23.7* 23.3*  MCV 83.5 84.2 83.7 83.5  PLT 155 177 195 186    Medications:     atorvastatin   80 mg Per Tube Daily   carvedilol   3.125 mg Per Tube BID WC   Chlorhexidine  Gluconate Cloth  6 each Topical Daily   clonazePAM   0.5 mg Per Tube BID   ezetimibe   10 mg Per Tube Daily   famotidine   20 mg Per Tube Daily   feeding supplement (PROSource TF20)  60 mL Per Tube BID   fiber  1 packet Per Tube BID   [name] butt cream   Topical BID   guaiFENesin   10 mL Per Tube Q4H   insulin  aspart  0-9 Units Subcutaneous Q4H   lidocaine   1 patch Transdermal Q24H   mexiletine  200 mg Oral Q12H   multivitamin  1 tablet Per Tube QHS   pantoprazole  (PROTONIX ) IV  40 mg Intravenous Q12H   predniSONE   20 mg Oral Q breakfast   Followed by   NOREEN ON 04/21/2024] predniSONE   10 mg Oral Q breakfast   QUEtiapine   12.5 mg Per Tube QHS   saccharomyces boulardii  250 mg Per Tube BID   sodium chloride  flush  10-40 mL Intracatheter Q12H   torsemide   100  mg Oral Daily

## 2024-04-16 NOTE — Plan of Care (Signed)
   Problem: Education: Goal: Knowledge of General Education information will improve Description: Including pain rating scale, medication(s)/side effects and non-pharmacologic comfort measures Outcome: Progressing   Problem: Clinical Measurements: Goal: Diagnostic test results will improve Outcome: Progressing   Problem: Clinical Measurements: Goal: Respiratory complications will improve Outcome: Progressing   Problem: Clinical Measurements: Goal: Cardiovascular complication will be avoided Outcome: Progressing

## 2024-04-16 NOTE — Progress Notes (Signed)
 Physical Therapy Treatment Patient Details Name: [name] MRN: [id number] DOB: 10/01/1950 Today's Date: 04/16/2024   History of Present Illness Billy [name] is a 73 y.o. male who presented to [facility] ED 01/21/24 with several days of vomiting followed by an episode of severe tearing epigastric pain. Work-up revealed esophageal tear. Pt s/p esophageal stent with EGD 10/7. Attempted stent repositioning on 10/15 and was hypoxic, was intubated.  Reintubated 10/20-10/24. G tube placed 11/3. He accidentally pulled out the R chest tube on 11/7.L chest tube removed 11/11. Found to have superficial venous thrombosis of RUE 11/19. N&V with epigastric pain 11/28, found severe duodenitis on repeat CT. S/P EGD on 12/2. 12/10 re-intubated due to respiratory distress. CRRT 12/10-12/16. Extubated 12/12. 12/26 severe respiratory distress needing nonrebreather, volume overloaded, required emergent HD. Pending US  abdomen. PMHx: HFrEF, AFib, CAD, PE complicated by cardiogenic shock, CKD 4, MI, peripheral neuropathy, and TIA.    PT Comments  Pt greeted supine in bed, pleasant and agreeable to PT session. He is making slow and steady progress towards his acute PT goals. Pt continues to be limited by decreased activity tolerance. He transferred to recliner chair with min-modA x2 for safety using RW. Educated pt on BLE exercises he could perform while sitting in recliner chair. He completed ankle pumps, LAQs, and marches with increased time and multi-modal cues. Provided pt with handout on general strengthening program which focused on exercises he could perform while in bed. Will continue to follow acutely and advance appropriately.    If plan is discharge home, recommend the following: Assistance with cooking/housework;Direct supervision/assist for medications management;Assist for transportation;Help with stairs or ramp for entrance;Two people to help with walking and/or transfers;Two people to help with  bathing/dressing/bathroom;Assistance with feeding;Direct supervision/assist for financial management   Can travel by private vehicle        Equipment Recommendations  Rolling walker (2 wheels);BSC/3in1;Wheelchair (measurements PT);Wheelchair cushion (measurements PT);Hospital bed    Recommendations for Other Services       Precautions / Restrictions Precautions Precautions: Fall Recall of Precautions/Restrictions: Impaired Precaution/Restrictions Comments: PEG Restrictions Weight Bearing Restrictions Per Provider Order: No     Mobility  Bed Mobility Overal bed mobility: Needs Assistance Bed Mobility: Rolling, Sidelying to Sit Rolling: Min assist, Used rails, +2 for safety/equipment Sidelying to sit: Mod assist, HOB elevated, Used rails, +2 for safety/equipment       General bed mobility comments: Pt sat up on L side of bed with increased time. Cues for sequencing. Assist to manage lines/leads. Pt slowly brought BLE towards EOB. Assist to bring BLE fully off bed, elevate trunk, and scoot hips fwd with use of bed pad.    Transfers Overall transfer level: Needs assistance Equipment used: Rolling walker (2 wheels) Transfers: Sit to/from Stand, Bed to chair/wheelchair/BSC Sit to Stand: Min assist, +2 safety/equipment   Step pivot transfers: Min assist, Mod assist, +2 safety/equipment       General transfer comment: Pt stood from lowest bed height. Cued proper hand placement using RW. Powered up with minA. He transferred to recliner chair on his right. Cues for sequencing. Assist to manage lines/leads. Aid to [facility]. Cued PLB technique throughout. Good eccentric control.    Ambulation/Gait Ambulation/Gait assistance: Min assist, Mod assist, +2 safety/equipment Gait Distance (Feet): 2 Feet Assistive device: Rolling walker (2 wheels) Gait Pattern/deviations: Step-to pattern, Trunk flexed, Shuffle Gait velocity: decr     General Gait Details: Pt took short slow steps  to transfer to recliner chair. Assist to manage lines/leads. Cues  for seqencing. Pt lacked foot clearence. Further gait deferred d/t fatigue.   Stairs             Wheelchair Mobility     Tilt Bed    Modified Rankin (Stroke Patients Only)       Balance Overall balance assessment: Needs assistance Sitting-balance support: Bilateral upper extremity supported, Feet supported Sitting balance-Leahy Scale: Fair Sitting balance - Comments: Pt sat EOB with close supervision-CGA.   Standing balance support: Bilateral upper extremity supported, During functional activity, Reliant on assistive device for balance Standing balance-Leahy Scale: Poor Standing balance comment: Pt dependent on RW                            Communication Communication Communication: Impaired Factors Affecting Communication: Hearing impaired  Cognition Arousal: Alert Behavior During Therapy: Anxious   PT - Cognitive impairments: Attention, Sequencing, Problem solving                       PT - Cognition Comments: Pt very nervous to mobilize. He perseverated on I will need help. Pt wanting to ensure 2 people are present to support him, the RW is blocked, and the recliner chair is locked. Provided reassurance and encouraged pt to attempt without assist for PT/Mobility Specialist. Following commands: Intact      Cueing Cueing Techniques: Verbal cues, Tactile cues  Exercises General Exercises - Lower Extremity Ankle Circles/Pumps: Seated, Both, AROM, 5 reps Long Arc Quad: Seated, Both, AROM, 5 reps Hip Flexion/Marching: Seated, Both, AROM, 5 reps    General Comments General comments (skin integrity, edema, etc.): VSS on 6L O2 via [place]. Educated pt on general BLE strengthening HEP and provided handout. Discussed a few exercises while seated in recliner chair.      Pertinent Vitals/Pain Pain Assessment Pain Assessment: Faces Faces Pain Scale: Hurts little more Pain Location: L  hip/thigh Pain Descriptors / Indicators: Discomfort, Aching, Sore Pain Intervention(s): Monitored during session, Limited activity within patient's tolerance, Repositioned    Home Living                          Prior Function            PT Goals (current goals can now be found in the care plan section) Acute Rehab PT Goals Patient Stated Goal: Feel better PT Goal Formulation: With patient Time For Goal Achievement: 04/30/24 Potential to Achieve Goals: Fair Progress towards PT goals: Progressing toward goals;Goals updated    Frequency    Min 2X/week      PT Plan      Co-evaluation              AM-PAC PT 6 Clicks Mobility   Outcome Measure  Help needed turning from your back to your side while in a flat bed without using bedrails?: A Little Help needed moving from lying on your back to sitting on the side of a flat bed without using bedrails?: A Lot Help needed moving to and from a bed to a chair (including a wheelchair)?: A Lot Help needed standing up from a chair using your arms (e.g., wheelchair or bedside chair)?: A Lot Help needed to walk in hospital room?: Total Help needed climbing 3-5 steps with a railing? : Total 6 Click Score: 11    End of Session Equipment Utilized During Treatment: Gait belt Activity Tolerance: Patient tolerated treatment well;Patient limited by  fatigue Patient left: in chair;with call bell/phone within reach Nurse Communication: Mobility status PT Visit Diagnosis: Other abnormalities of gait and mobility (R26.89);Muscle weakness (generalized) (M62.81);Other symptoms and signs involving the nervous system (R29.898);History of falling (Z91.81)     Time: 8886-8862 PT Time Calculation (min) (ACUTE ONLY): 24 min  Charges:    $Therapeutic Exercise: 8-22 mins $Therapeutic Activity: 8-22 mins PT General Charges $$ ACUTE PT VISIT: 1 Visit                     Randall SAUNDERS, PT, DPT Acute Rehabilitation Services Office:  [phone] Secure Chat Preferred  Delon CHRISTELLA Callander 04/16/2024, 1:27 PM

## 2024-04-16 NOTE — Progress Notes (Signed)
 Case discussed with nephrologist. Plan is for pt to go to snf in [place] at d/c. Referral submitted to [facility] admissions this afternoon for review. Noted RN note that pt would like to speak to renal navigator. Will f/u with pt tomorrow. Will assist as needed.   Randine Mungo Dialysis Navigator (coverage) [phone]

## 2024-04-16 NOTE — Progress Notes (Signed)
 " Progress Note   Patient: Billy [name] FMW:[id number] DOB: [birth year] DOA: 01/21/2024     86 DOS: the patient was seen and examined on 04/16/2024   Brief hospital course: 73 year old male Prolonged admission 3/10-07/20/18/2025 cardiac arrest requiring ICU management cardioverted for A-fib-found to have DVTs then had hematochezia necessitating IVC filter-recurrent GI bleed secondary to erosive gastropathy and friable duodenal mucosa and colonoscopy was deferred and then chest tube placements cardiac cath at that time showed CAD with CTO of RCA and was treated with diuretics and sent home on torsemide  Entresto  Jardiance -   Readmitted 10/5 with nausea vomiting sudden tearing sensation found to have an esophageal tear  10/7 esophageal stent placed by cardiothoracic 10/15 stent reposition 10/16 extubated 10/20 reintubated with ultrasound showing left-sided pleural effusion-left-sided chest tube placed-by lights criteria exudative effusion 10/23 echo 60-65% 10/24 barium swallow showed persistent leak 10/28 transferred out of ICU 11/3 PEG tube placed 11/19 swelling of right arm showing on ultrasound SVT cephalic vein 11/22 chest x-ray?  Pneumonia done because of back pain-follow-up CT scan shows stent in place no mediastinal fluid collection small bilateral pleural effusions with bibasilar atelectasis pneumonia not excluded-scattered clusters nodular densities predominantly right upper and right lower lobes with nodule 11 mm as previously seen?  Mets?  Infection?  Aspiration 11/26 stent removed. Esophagram negative for esophageal leak.  11/27 getting diuresis again 11/28 duodenitis/enteritis.  GI consulted. Bloody emesis added carafate   11/29 trialing clears 12/1 NV again w/ hematemesis. Lasix  held ---  esophagram performed showing patent esophagus without perforation irregularity mid esophagus?  Stent diffuse narrowing lumen distal duodenum and proximal jejunum 12/2 EGD ischemic ulcers. Impression:   - Congested, inflamed, nodular, texture changed  mucosa in the esophagus. - Gastritis, Non-bleeding duodenal ulcers with a clean ulcer  base (Forrest Class III).- Non-bleeding jejunal ulcers with a clean ulcer   base (Forrest Class III)  - Findings suggestive of ischemic ulcers. Increased carafate  and cont BID PPI  12/3 full liq diet  12/4 marked increased WOB. Acute and progressive w/ increased hypoxia and HTN. PCCM asked to see  12/10 again increased WOB, stubborn to diuresis, unable to oxygenate, to ICU--- azotemia and renal consulted 12/11 Intubated  12/12 extubated 12/13 c/o of epigastric pain but suspect more anxiety component, on cleviprex , CRRT for volume removal 12/15 pain improved, on RA, on clev and CRRT, renal bx pending 12/16 renal biopsy performed showing 12/17 off clev transition to iHD, transfer to floor  12/18: TDC placed 12/19 [name] assumed care, has distended and painful abdomen today, KUB shows improvement so tube feeds resumed, Eliquis  resumed 12/22 hemoccult neg---transfuse 1 U--- Eliquis  had to be held in the setting 12/23 vascular consulted secondary to lower extremities--- sleepy this morning-x-ray shows congestion no pneumonia white count up-- 12/24 ABIs showed abnormal right and left toe brachial indices-no intervention from vascular required 12/26 severe respiratory distress needing nonrebreather   Assessment and Plan: Iatrogenic volume overload 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 as per renal not really candidate for alternate agents Hyperkalemia Stop Lokelma  defer further management hyperkalemia to renal IHD with [facility] 12/18 placed Note events from 12/26-transferred to progressive unit heated high flow after dialysis Overnight events noted on 12/29. Increased O2 requirements following 1 unit PRBC. Pt underwent HD today after receiving additional IV lasix . O2 requirements have now improved to  [facility]   Nonspecific leukocytosis afebrile   Boerhaave syndrome,--- events as above-- moderate to severe abdominal pain Ischemic ulcer disease--GI and  cardiothoracic aware-several CTs since 11/25---CT scan done 12/21 unremarkable-- NO Carafate  with HD, stop Maalox Last esophagram as above 12/1 Pantoprazole  40 IV twice daily, Pepcid  20 daily Severe Malnutrition-BMI 22 evidence of weight loss S/p PEG Tolerating TF. Encourage po as tolerated   Normocytic anemia secondary to peptic ulcer disease,?  Ongoing gastritis as above Transfused PRBC x 1u in October, x1 unit 03/29/2024,12/23, 12/28 No obvious blood loss at this time, f/u on hemeoccult    Cardiac arrest 06/2023 with HF MR EF 55-65% 03/27/2024 A-fib RVR CHADVASC >4 on Eliquis  [cardioverted 06/2023]--- recurrent RVR at times Eliquis  has been held since 12/22  cardiology consulted 12/21-signed off-patient continues Amio 200 twice daily --continue Coreg  6.25 twice daily for recurrent A-fib RVR RVR Entresto  and torsemide  forever held given current issues Heparin  currently on hold secondary to recently needing PRBC tx If hgb stable on 12/31, can consider resuming heparin   DVT PE March 2025 does have IVC filter in place since March Anticoagulation resumed cautiously 12/25 ?  PAD with mottling of skin on removal of Unna boot from pictures in 12/22 note  Lower limb ischemia ruled out 12.23 ABI rules out ischemia-appreciate VVS--no intervention needed   Insomnia May continue trazodone  50-? Seroquel  to 12.5 at bedtime---? hydroxyzine  to 25 3 times daily given aninxety   Diabetes CBG 108-160-only every 4 coverage   Multiple bedsores and wounds anterior right and left foot and injury stage I to bottom Continue Gerhardt twice daily Lidoderm  patch for back pain---Bed sore last examined 12/22 and looks good Schedule Lomotil  1 tablet twice daily, can use as needed Lomotil   4 times daily in addition  VT - recurrent runs of VT 12/28 -  Cardiology reconsulted. Orders for IV amio gtt per Cardiology  Elevated LFT's -New Alk phos 1116, AST 249, ALT 356 -On CT abd from 12/21 with mild biliary ductal dilation vs mild periportal edema and tiny layering stone in GB neck -Pt is complaining of more generalized abd discomfort -Ordered RUQ US , pending    Resolved/stable problems Petechial rash Hydralazine  and Unasyn  have been stopped, cannot rule out HSP-this seems unlikely to me however he has mottling in general over his skin-see the pictures from 12/22 of his feet-seem stable to me Acute hypoxic respiratory failure//Aspiration Pneumonia//Pleural effusion//Acute on chronic diastolic CHF exacerbation ?respiratory failure due to pneumonia, pleural effusion, CHF exacerbation -- Better c Lasix  and antibiotics. Intemrittently seems to have this Bilateral UE swelling--duplex upper extremities ruled out DVT Lung nodules-repeat CT in Jan/Feb window        Subjective: Reports breathing better this AM  Physical Exam: Vitals:   04/16/24 0208 04/16/24 0300 04/16/24 0744 04/16/24 0800  BP:  (!) 115/59 (!) 120/54 (!) 126/47  Pulse:  77 70   Resp:  (!) 21 20   Temp:  97.8 F (36.6 C) 97.7 F (36.5 C)   TempSrc:  Oral Oral   SpO2: 99% 100% 93%   Weight:  76.1 kg    Height:  6' 1 (1.854 m)     General exam: Awake, laying in bed, in nad Respiratory system: Normal respiratory effort, no wheezing Cardiovascular system: regular rate, s1, s2 Gastrointestinal system: Soft, nondistended, positive BS Central nervous system: CN2-12 grossly intact, strength intact Extremities: Perfused, no clubbing Skin: Normal skin turgor, no notable skin lesions seen Psychiatry: Mood normal // no visual hallucinations   Data Reviewed:  Labs reviewed: 135, K 4.2, Cr 2.58, Alk phos 1116, AST 249, ALT 356, TB 1.2, WBC 11.5, Hgb 7.7, Plts  186  Family Communication: Pt in room, family not at bedside  Disposition: Status is: Inpatient Remains  inpatient appropriate because: severity of illness  Planned Discharge Destination: LTC    Author: Garnette Pelt, MD 04/16/2024 11:22 AM  For on call review [email or web address].  "

## 2024-04-16 NOTE — Progress Notes (Signed)
 Mobility Specialist Progress Note:    04/16/24 1330  Mobility  Activity Pivoted/transferred from chair to bed  Level of Assistance +2 (takes two people)  Assistive Device None  Distance Ambulated (ft) 3 ft  Range of Motion/Exercises Active  Activity Response Tolerated poorly  Mobility visit 1 Mobility  Mobility Specialist Start Time (ACUTE ONLY) 1330  Mobility Specialist Stop Time (ACUTE ONLY) 1338  Mobility Specialist Time Calculation (min) (ACUTE ONLY) 8 min   Received pt requesting to be placed back to bed from recliner. C/o back pain and fatigue. Pt needing MaxA (+2 for others) to transfer back to bed w/ RN handling line management for help. Pt needing assistance bring legs back into bed. Returned pt to bed w/ all needs met.   Venetia Keel Mobility Specialist Please Neurosurgeon or Rehab Office at [phone]

## 2024-04-16 NOTE — Progress Notes (Cosign Needed Addendum)
 "  Rounding Note   Patient Name: Billy [name] Date of Encounter: 04/16/2024  [facility] Cardiologist: Dorn Lesches, MD   Subjective  More comfortable today  Scheduled Meds:  atorvastatin   80 mg Per Tube Daily   carvedilol   3.125 mg Per Tube BID WC   Chlorhexidine  Gluconate Cloth  6 each Topical Daily   clonazePAM   0.5 mg Per Tube BID   ezetimibe   10 mg Per Tube Daily   famotidine   20 mg Per Tube Daily   feeding supplement (PROSource TF20)  60 mL Per Tube BID   fiber  1 packet Per Tube BID   [name] butt cream   Topical BID   guaiFENesin   10 mL Per Tube Q4H   insulin  aspart  0-9 Units Subcutaneous Q4H   lidocaine   1 patch Transdermal Q24H   mexiletine  200 mg Oral Q12H   multivitamin  1 tablet Per Tube QHS   pantoprazole  (PROTONIX ) IV  40 mg Intravenous Q12H   predniSONE   20 mg Oral Q breakfast   Followed by   NOREEN ON 04/21/2024] predniSONE   10 mg Oral Q breakfast   QUEtiapine   12.5 mg Per Tube QHS   saccharomyces boulardii  250 mg Per Tube BID   sodium chloride  flush  10-40 mL Intracatheter Q12H   torsemide   100 mg Oral Daily   Continuous Infusions:  amiodarone  30 mg/hr (04/16/24 0253)   feeding supplement (VITAL 1.5 CAL) 1,000 mL (04/16/24 0239)   promethazine  (PHENERGAN ) injection (IM or IVPB) 12.5 mg (04/05/24 1153)   PRN Meds: acetaminophen  (TYLENOL ) oral liquid 160 mg/5 mL, diclofenac  Sodium, [COMPLETED] diphenoxylate -atropine  **FOLLOWED BY** diphenoxylate -atropine , glucagon  (human recombinant), HYDROmorphone  (DILAUDID ) injection, hydrOXYzine , lip balm, mouth rinse, oxyCODONE , polyethylene glycol, promethazine  (PHENERGAN ) injection (IM or IVPB), simethicone , sodium chloride , sodium chloride  flush, traZODone    Vital Signs  Vitals:   04/15/24 2222 04/15/24 2300 04/16/24 0208 04/16/24 0300  BP:  (!) 97/53  (!) 115/59  Pulse: 68 67  77  Resp: 20 19  (!) 21  Temp:  97.7 F (36.5 C)  97.8 F (36.6 C)  TempSrc:  Oral  Oral  SpO2: 100% 100% 99% 100%   Weight:    76.1 kg  Height:    6' 1 (1.854 m)    Intake/Output Summary (Last 24 hours) at 04/16/2024 0718 Last data filed at 04/16/2024 0300 Gross per 24 hour  Intake 782.28 ml  Output 2250 ml  Net -1467.72 ml      04/16/2024    3:00 AM 04/15/2024    4:28 PM 04/15/2024   12:39 PM  Last 3 Weights  Weight (lbs) 167 lb 12.3 oz 169 lb 8.5 oz 172 lb 9.9 oz  Weight (kg) 76.1 kg 76.9 kg 78.3 kg      Telemetry  AFib 70's mostly, no brady or tachy. Waxing/waning PVCs, couples, 3 beat salvos, no other NSVT or sustained VT since yesterday morning   - Personally Reviewed  ECG   No new EKGs - Personally Reviewed    Physical Exam  Seen and examined by Dr. Almetta this AM GEN: No acute distress, chronically ill appearing.   Neck: No JVD Cardiac: RRR, no murmurs, rubs, or gallops.  Respiratory: CTA b/l. GI: Soft, nontender, non-distended, PEG MS: trace edema; No deformity. Neuro:  Nonfocal  Psych: Normal affect    Labs High Sensitivity Troponin:  No results for input(s): TROPONINIHS in the last 720 hours. No results for input(s): TRNPT in the last 720 hours.  Chemistry Recent Labs  Lab 04/14/24 0500 04/14/24 0509 04/14/24 1455 04/15/24 0628 04/15/24 1004 04/15/24 1150 04/16/24 0603  NA 135  --  132* 135  --  133* 135  K 4.1  --  4.7 4.6  --  4.8 4.2  CL 96*  --  94* 94*  --  93* 96*  CO2 30  --  28 28  --  28 30  GLUCOSE 172*  --  205* 188*  --  256* 159*  BUN 65*  --  77* 85*  --  91* 62*  CREATININE 2.77*  --  3.13* 3.29*  --  3.49* 2.58*  CALCIUM  7.5*  --  7.9* 7.8*  --  7.7* 7.8*  MG  --  1.9 2.0  --  2.2  --   --   PROT  --   --   --   --   --   --  4.8*  ALBUMIN  1.9*  --   --  2.1*  --   --  2.0*  AST  --   --   --   --   --   --  249*  ALT  --   --   --   --   --   --  356*  ALKPHOS  --   --   --   --   --   --  1,116*  BILITOT  --   --   --   --   --   --  1.2  GFRNONAA 24*  --  20* 19*  --  18* 26*  ANIONGAP 9  --  11 13  --  12 9     Lipids No results for input(s): CHOL, TRIG, HDL, LABVLDL, LDLCALC, CHOLHDL in the last 168 hours.  Hematology Recent Labs  Lab 04/14/24 1455 04/15/24 1150 04/16/24 0603  WBC 12.5* 14.8* 11.5*  RBC 3.55* 2.83* 2.79*  HGB 9.8* 7.7* 7.7*  HCT 29.9* 23.7* 23.3*  MCV 84.2 83.7 83.5  MCH 27.6 27.2 27.6  MCHC 32.8 32.5 33.0  RDW 16.6* 16.9* 17.4*  PLT 177 195 186   Thyroid  No results for input(s): TSH, FREET4 in the last 168 hours.  BNPNo results for input(s): BNP, PROBNP in the last 168 hours.  DDimer No results for input(s): DDIMER in the last 168 hours.   Radiology   DG CHEST PORT 1 VIEW Result Date: 04/15/2024 EXAM: 1 VIEW(S) XRAY OF THE CHEST 04/15/2024 07:56:00 AM COMPARISON: 04/14/2024. CLINICAL HISTORY: Acute respiratory distress. FINDINGS: LINES, TUBES AND DEVICES: Right arm PICC line tip is in the projection of the SVC. There is also a right IJ dialysis catheter with tip at the superior cavoatrial junction. Loop recorder is noted in the projection of the left heart border. LUNGS AND PLEURA: Unchanged small bilateral pleural effusions. Persistent bilateral interstitial and airspace opacities. No pneumothorax. HEART AND MEDIASTINUM: No acute abnormality of the cardiac and mediastinal silhouettes. BONES AND SOFT TISSUES: No acute osseous abnormality. IMPRESSION: 1. Persistent bilateral interstitial and airspace opacities. 2. Unchanged small bilateral pleural effusions. Electronically signed by: Waddell Calk MD 04/15/2024 08:07 AM EST RP Workstation: [id number]   DG Chest Port 1 View Result Date: 04/14/2024 CLINICAL DATA:  Pulmonary edema. EXAM: PORTABLE CHEST 1 VIEW COMPARISON:  Radiograph yesterday FINDINGS: Stable positioning of right-sided dialysis catheter and right upper extremity PICC. Slight improvement in heterogeneous interstitial opacities. Cardiomegaly is stable. Small pleural effusions are unchanged. Left chest wall loop recorder. No pneumothorax.  IMPRESSION: 1.  Slight improvement in heterogeneous interstitial opacities, likely improving pulmonary edema. 2. Stable small pleural effusions. Electronically Signed   By: Andrea Gasman M.D.   On: 04/14/2024 16:42    Cardiac Studies   04/15/24: limited echo 1. Left ventricular ejection fraction, by estimation, is 60 to 65%. The  left ventricle has normal function. There is mild concentric left  ventricular hypertrophy. Left ventricular diastolic function could not be  evaluated.   2. Right ventricular systolic function is normal. The right ventricular  size is normal.   3. The mitral valve is normal in structure. No evidence of mitral valve  regurgitation.   4. The aortic valve is tricuspid. Aortic valve regurgitation is not  visualized. Aortic valve sclerosis/calcification is present, without any  evidence of aortic stenosis.   5. The inferior vena cava is normal in size with greater than 50%  respiratory variability, suggesting right atrial pressure of 3 mmHg.    TTE Result date: 03/27/24  1. Left ventricular ejection fraction, by estimation, is 55 to 60%. Left  ventricular ejection fraction by 2D MOD biplane is 59.1 %. The left  ventricle has normal function. The left ventricle demonstrates regional  wall motion abnormalities with basal  inferior akinesis and basal inferolateral akinesis. There is moderate  concentric left ventricular hypertrophy. Left ventricular diastolic  parameters are consistent with Grade II diastolic dysfunction  (pseudonormalization).   2. Right ventricular systolic function is mildly reduced. The right  ventricular size is normal. Tricuspid regurgitation signal is inadequate  for assessing PA pressure.   3. The mitral valve is normal in structure. No evidence of mitral valve  regurgitation. No evidence of mitral stenosis.   4. The aortic valve is tricuspid. There is moderate calcification of the  aortic valve. Aortic valve regurgitation is not  visualized. Aortic valve  sclerosis/calcification is present, without any evidence of aortic  stenosis.   5. The inferior vena cava is normal in size with greater than 50%  respiratory variability, suggesting right atrial pressure of 3 mmHg.    cMRI  Result date: 07/07/23 1. Study is less consistent with cardiac amyloidosis or infiltrative disease. Study is more consistent with inferolateral territory RCA or LCX disease (with ECV elevation but without notable scar). Though septum is 15 mm, there are no other class features consistent with hypertrophic cardiomyopathy. 2.  Low normal LVEF (52%) with mild LV dilation. 3.  Right ventricular dilation with normalization of function. 4. Severe right atrial dilation. Mild pulmonary artery dilation. Moderate tricuspid regurgitation.     Patient Profile   73 y.o. 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 H/H stable from yesterday (management deferred to IM)   NSVT, VT Hx of VF arrest in setting of acute PE/RV failure in March 2025 cath then with CTO RCA, non-obstructive dz otherwise. 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 HFpEF 2,000 removed yesterday with HD Will keep  amio gtt today > if no VT > PO tomorrow Volume otherwise as per nephrology team O2 down to 2L > sats 100% this morning    For questions or updates, please contact [facility] Please consult [email or web address] for contact info under   Signed, Charlies Macario Arthur, PA-C  04/16/2024, 7:18 AM    "

## 2024-04-16 NOTE — Progress Notes (Signed)
 PHARMACY - ANTICOAGULATION CONSULT NOTE  Pharmacy Consult for Heparin  Indication: atrial fibrillation and DVT  Allergies[1]  Patient Measurements: Height: 6' 1 (185.4 cm) Weight: 76.1 kg (167 lb 12.3 oz) IBW/kg (Calculated) : 79.9 HEPARIN  DW (KG): 76.1  Vital Signs: Temp: 98.1 F (36.7 C) (12/30 1540) Temp Source: Oral (12/30 1540) BP: 141/71 (12/30 1540) Pulse Rate: 81 (12/30 1540)  Labs: Recent Labs    04/14/24 0500 04/14/24 1455 04/15/24 0628 04/15/24 1150 04/16/24 0603  HGB 6.3* 9.8*  --  7.7* 7.7*  HCT 19.2* 29.9*  --  23.7* 23.3*  PLT 155 177  --  195 186  HEPARINUNFRC 0.28*  --  <0.10*  --  <0.10*  CREATININE 2.77* 3.13* 3.29* 3.49* 2.58*    Estimated Creatinine Clearance: 28.3 mL/min (A) (by C-G formula based on SCr of 2.58 mg/dL (H)).  Assessment: Billy [name] is a 73 y.o. year old male admitted on 01/21/2024 with concern for esophageal rupture. On eliquis  prior to admission for afib and hx DVT (last dose pta 12/9 PM). Pharmacy consulted to dose heparin  for afib with no bolus.    Will start heparin  drip at 1800 uts/hr given previously therapeutic on this dose during this admission. Hemoglobin low-7.3, will continue to monitor. Platelets stable at 164.    Heparin  drip on hold with drop in HGB over weekend now HGb seems low stable 7s - no bleeding noted Will await further work up and team discussions for follow up on restart anticoagulation.   Goal of Therapy:  Heparin  level 0.3-0.5 units/ml Monitor platelets by anticoagulation protocol: Yes   Plan:  Hold heparin  infusion F/u with [name] for resumption of drip.   Olam Chalk Pharm.D. CPP, BCPS Clinical Pharmacist [phone] 04/16/2024 3:42 PM           [1]  Allergies Allergen Reactions   Hydralazine  Hcl     Concern for drug induced lupus   Norvasc  [Amlodipine ] Swelling and Other (See Comments)    Excessive BLE swelling Skin discoloration, blotching   Jardiance  [Empagliflozin ] Itching and  Other (See Comments)    Patient mentioned penile swelling, pain   Morphine  And Codeine Itching    Opioid-induced pruritus

## 2024-04-16 NOTE — Progress Notes (Signed)
 Patient would like to speak to renal navigator. RN unable to reach renal navigator with number provided. RN to update oncoming shift to notify the renal navigator tomorrow.

## 2024-04-16 NOTE — Progress Notes (Signed)
 Patient provided permission for RN to call and update son. RN called and updated Billy [name], son about plan of care in regards to dialysis schedule.

## 2024-04-17 DIAGNOSIS — E43 Unspecified severe protein-calorie malnutrition: Secondary | ICD-10-CM | POA: Diagnosis not present

## 2024-04-17 DIAGNOSIS — I5023 Acute on chronic systolic (congestive) heart failure: Secondary | ICD-10-CM | POA: Diagnosis not present

## 2024-04-17 DIAGNOSIS — Z515 Encounter for palliative care: Secondary | ICD-10-CM | POA: Diagnosis not present

## 2024-04-17 DIAGNOSIS — I4891 Unspecified atrial fibrillation: Secondary | ICD-10-CM | POA: Diagnosis not present

## 2024-04-17 DIAGNOSIS — Z7189 Other specified counseling: Secondary | ICD-10-CM | POA: Diagnosis not present

## 2024-04-17 DIAGNOSIS — K223 Perforation of esophagus: Secondary | ICD-10-CM | POA: Diagnosis not present

## 2024-04-17 LAB — CBC
HCT: 23.3 % — ABNORMAL LOW (ref 39.0–52.0)
Hemoglobin: 7.7 g/dL — ABNORMAL LOW (ref 13.0–17.0)
MCH: 27.3 pg (ref 26.0–34.0)
MCHC: 33 g/dL (ref 30.0–36.0)
MCV: 82.6 fL (ref 80.0–100.0)
Platelets: 222 K/uL (ref 150–400)
RBC: 2.82 MIL/uL — ABNORMAL LOW (ref 4.22–5.81)
RDW: 17.4 % — ABNORMAL HIGH (ref 11.5–15.5)
WBC: 15.2 K/uL — ABNORMAL HIGH (ref 4.0–10.5)
nRBC: 0 % (ref 0.0–0.2)

## 2024-04-17 LAB — COMPREHENSIVE METABOLIC PANEL WITH GFR
ALT: 379 U/L — ABNORMAL HIGH (ref 0–44)
AST: 251 U/L — ABNORMAL HIGH (ref 15–41)
Albumin: 1.9 g/dL — ABNORMAL LOW (ref 3.5–5.0)
Alkaline Phosphatase: 1332 U/L — ABNORMAL HIGH (ref 38–126)
Anion gap: 12 (ref 5–15)
BUN: 85 mg/dL — ABNORMAL HIGH (ref 8–23)
CO2: 28 mmol/L (ref 22–32)
Calcium: 7.9 mg/dL — ABNORMAL LOW (ref 8.9–10.3)
Chloride: 94 mmol/L — ABNORMAL LOW (ref 98–111)
Creatinine, Ser: 3.2 mg/dL — ABNORMAL HIGH (ref 0.61–1.24)
GFR, Estimated: 20 mL/min — ABNORMAL LOW
Glucose, Bld: 134 mg/dL — ABNORMAL HIGH (ref 70–99)
Potassium: 4.3 mmol/L (ref 3.5–5.1)
Sodium: 133 mmol/L — ABNORMAL LOW (ref 135–145)
Total Bilirubin: 1.3 mg/dL — ABNORMAL HIGH (ref 0.0–1.2)
Total Protein: 4.9 g/dL — ABNORMAL LOW (ref 6.5–8.1)

## 2024-04-17 LAB — GLUCOSE, CAPILLARY
Glucose-Capillary: 121 mg/dL — ABNORMAL HIGH (ref 70–99)
Glucose-Capillary: 129 mg/dL — ABNORMAL HIGH (ref 70–99)
Glucose-Capillary: 141 mg/dL — ABNORMAL HIGH (ref 70–99)
Glucose-Capillary: 157 mg/dL — ABNORMAL HIGH (ref 70–99)
Glucose-Capillary: 184 mg/dL — ABNORMAL HIGH (ref 70–99)
Glucose-Capillary: 191 mg/dL — ABNORMAL HIGH (ref 70–99)

## 2024-04-17 LAB — HEPARIN LEVEL (UNFRACTIONATED): Heparin Unfractionated: <0.1 [IU]/mL — ABNORMAL LOW (ref 0.30–0.70)

## 2024-04-17 LAB — MAGNESIUM: Magnesium: 2.3 mg/dL (ref 1.7–2.4)

## 2024-04-17 MED ORDER — HEPARIN SODIUM (PORCINE) 1000 UNIT/ML IJ SOLN
INTRAMUSCULAR | Status: AC
  Filled 2024-04-17: qty 4

## 2024-04-17 NOTE — Progress Notes (Addendum)
 " PROGRESS NOTE    Billy [name]  FMW:[id number] DOB: [birth year] DOA: 01/21/2024 PCP: Seabron Lenis, MD  Chronically ill 73/M prolonged hospitalization from 3/10-4/3 with cardiac arrest, A-fib, DVTs, hematochezia, IVC filter, chest tubes, eventually stabilized, discharged to rehab and then went home, readmitted 10/5 with large esophageal tear 10/7 esophageal stent placed by cardiothoracic 10/15 stent reposition 10/16 extubated 10/20 reintubated with ultrasound showing left-sided pleural effusion-left-sided chest tube placed-by lights criteria exudative effusion 10/23 echo 60-65% 10/24 barium swallow showed persistent leak 10/28 transferred out of ICU 11/3 PEG tube placed 11/19 swelling of right arm showing on ultrasound SVT cephalic vein 11/22 chest x-ray?  Pneumonia done because of back pain-follow-up CT scan shows stent in place no mediastinal fluid collection small bilateral pleural effusions with bibasilar atelectasis pneumonia not excluded-scattered clusters nodular densities predominantly right upper and right lower lobes with nodule 11 mm as previously seen?  Mets?  Infection?  Aspiration 11/26 stent removed. Esophagram negative for esophageal leak.  11/27 getting diuresis again 11/28 duodenitis/enteritis.  GI consulted. Bloody emesis added carafate   11/29 trialing clears 12/1 NV again w/ hematemesis. Lasix  held ---  esophagram performed showing patent esophagus without perforation irregularity mid esophagus?  Stent diffuse narrowing lumen distal duodenum and proximal jejunum 12/2 EGD ischemic ulcers. Impression:  - Congested, inflamed, nodular, texture changed  mucosa in the esophagus. - Gastritis, Non-bleeding duodenal ulcers with a clean ulcer  base (Forrest Class III).- Non-bleeding jejunal ulcers with a clean ulcer   base (Forrest Class III)  - Findings suggestive of ischemic ulcers. Increased carafate  and cont BID PPI  12/3 full liq diet  12/4 marked increased WOB. Acute and  progressive w/ increased hypoxia and HTN. 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, felt to be secondary to infection  - Comp needed course of antibiotics  - Currently on steroids per nephrology  - For HD today, monitor for renal recovery AKI   Nonspecific leukocytosis -Afebrile, has completed multiple rounds of antibiotics, monitor clinically for now  -  Ongoing prednisone  could be contributing  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 Encourage po as tolerated  Normocytic anemia secondary to peptic ulcer disease,?  Ongoing gastritis as above -Transfused PRBC x 1u in October, x1 unit 03/29/2024,12/23, 12/28  Cardiac arrest 06/2023 with HF MR EF 55-65% 03/27/2024  A-fib RVR CHADVASC >4 on Eliquis  [cardioverted 06/2023]--- recurrent RVR at times -Eliquis  has been held since 12/22  -cardiology following again for recurrent A-fib RVR -Now on low-dose Coreg , amiodarone  and mexiletine discontinued today with abnormal LFTs -Restart anticoagulation with heparin  tomorrow if hemoglobin stays stable   DVT PE March 2025 -does have IVC filter in place since March Lower limb ischemia ruled out 12.23 ABI rules out ischemia-appreciate VVS--no intervention needed  Insomnia -continue trazodone  50-? Seroquel  to 12.5 at bedtime---? hydroxyzine  to 25 3 times daily given aninxety  Diabetes -CBG 108-160-only every 4 coverage  Multiple bedsores and wounds anterior right and left foot and injury stage I to bottom -Continue Gerhardt twice daily Lidoderm  patch for back pain  VT - recurrent runs of VT 12/28 - Cardiology reconsulted.  Started on IV amiodarone , then stopped today   Elevated LFT's -New Alk phos 1116, AST 249, ALT 356 -On CT abd from 12/21 with mild biliary ductal dilation vs mild periportal edema and tiny layering stone in GB neck -Pt is complaining of more generalized abd discomfort -FU  RUQ US     Resolved/stable problems Petechial rash Hydralazine  and Unasyn  have been stopped, cannot rule out HSP-this seems unlikely to me however he has mottling in general over his skin-see the pictures from 12/22 of his feet-seem stable to me Acute hypoxic respiratory failure//Aspiration Pneumonia//Pleural effusion//Acute on chronic diastolic CHF exacerbation ?respiratory failure due to pneumonia, pleural effusion, CHF exacerbation -- Better c Lasix  and antibiotics. Intemrittently seems to have this Bilateral UE swelling--duplex upper extremities ruled out DVT Lung nodules-repeat CT in Jan/Feb window       DVT prophylaxis: SCDs Code Status: FUll Code Family Communication: none present Disposition Plan: SNF if he improves   Consultants:    Procedures:   Antimicrobials:    Objective: Vitals:   04/17/24 1131 04/17/24 1230 04/17/24 1250 04/17/24 1300  BP: (!) 146/60 (!) 143/57 (!) 153/70 (!) 126/57  Pulse:  83 84 79  Resp:   18 (!) 35  Temp: 98 F (36.7 C) 98.2 F (36.8 C)    TempSrc: Oral Oral    SpO2:  94% 100% 100%  Weight:  78.9 kg    Height:        Intake/Output Summary (Last 24 hours) at 04/17/2024 1311 Last data filed at 04/17/2024 0900 Gross per 24 hour  Intake 360 ml  Output 600 ml  Net -240 ml   Filed Weights   04/16/24 0300 04/17/24 0300 04/17/24 1230  Weight: 76.1 kg 75.9 kg 78.9 kg    Examination:  General exam: Chronically ill-appearing, AAO x 2 HEENT: Positive JVD, right IJ HD catheter CVS: S1-S2, irregular rhythm Lungs: Poor air movement, decreased at the bases Ab DeMent: Soft, mildly distended, PEG tube noted Extremities: 1+ edema Psychiatry: Flat affect    Data Reviewed:   CBC: Recent Labs  Lab 04/14/24 0500 04/14/24 1455 04/15/24 1150 04/16/24 0603 04/17/24 0416  WBC  13.6* 12.5* 14.8* 11.5* 15.2*  NEUTROABS  --  11.4*  --   --   --   HGB 6.3* 9.8* 7.7* 7.7* 7.7*  HCT 19.2* 29.9* 23.7* 23.3* 23.3*  MCV 83.5 84.2 83.7 83.5  82.6  PLT 155 177 195 186 222   Basic Metabolic Panel: Recent Labs  Lab 04/11/24 1231 04/12/24 0247 04/13/24 0520 04/14/24 0500 04/14/24 0509 04/14/24 1455 04/15/24 0628 04/15/24 1004 04/15/24 1150 04/16/24 0603 04/17/24 0416  NA 133* 134* 131* 135  --  132* 135  --  133* 135 133*  K 5.6* 5.7* 4.8 4.1  --  4.7 4.6  --  4.8 4.2 4.3  CL 95* 95* 93* 96*  --  94* 94*  --  93* 96* 94*  CO2 24 27 28 30   --  28 28  --  28 30 28   GLUCOSE 175* 152* 88 172*  --  205* 188*  --  256* 159* 134*  BUN 105* 111* 80* 65*  --  77* 85*  --  91* 62* 85*  CREATININE 4.39* 4.61* 3.37* 2.77*  --  3.13* 3.29*  --  3.49* 2.58* 3.20*  CALCIUM  8.2* 8.1* 7.8* 7.5*  --  7.9* 7.8*  --  7.7* 7.8* 7.9*  MG  --   --   --   --  1.9 2.0  --  2.2  --   --  2.3  PHOS 3.3 3.7 4.2 3.8  --   --  3.3  --   --   --   --    GFR: Estimated Creatinine Clearance: 23.6 mL/min (A) (by C-G formula based on SCr of 3.2 mg/dL (H)). Liver Function Tests: Recent Labs  Lab 04/13/24 0520 04/14/24 0500 04/15/24 0628 04/16/24 0603 04/17/24 0416  AST  --   --   --  249* 251*  ALT  --   --   --  356* 379*  ALKPHOS  --   --   --  1,116* 1,332*  BILITOT  --   --   --  1.2 1.3*  PROT  --   --   --  4.8* 4.9*  ALBUMIN  2.0* 1.9* 2.1* 2.0* 1.9*   No results for input(s): LIPASE, AMYLASE in the last 168 hours. No results for input(s): AMMONIA in the last 168 hours. Coagulation Profile: No results for input(s): INR, PROTIME in the last 168 hours. Cardiac Enzymes: No results for input(s): CKTOTAL, CKMB, CKMBINDEX, TROPONINI in the last 168 hours. BNP (last 3 results) No results for input(s): PROBNP in the last 8760 hours. HbA1C: No results for input(s): HGBA1C in the last 72 hours. CBG: Recent Labs  Lab 04/16/24 2019 04/17/24 0003 04/17/24 0406 04/17/24 0734 04/17/24 1132  GLUCAP 155* 129* 121* 141* 184*   Lipid Profile: No results for input(s): CHOL, HDL, LDLCALC, TRIG, CHOLHDL,  LDLDIRECT in the last 72 hours. Thyroid  Function Tests: No results for input(s): TSH, T4TOTAL, FREET4, T3FREE, THYROIDAB in the last 72 hours. Anemia Panel: No results for input(s): VITAMINB12, FOLATE, FERRITIN, TIBC, IRON , RETICCTPCT in the last 72 hours. Urine analysis:    Component Value Date/Time   COLORURINE YELLOW 03/27/2024 1122   APPEARANCEUR CLEAR 03/27/2024 1122   LABSPEC 1.025 03/27/2024 1122   PHURINE 5.5 03/27/2024 1122   GLUCOSEU NEGATIVE 03/27/2024 1122   HGBUR LARGE (A) 03/27/2024 1122   BILIRUBINUR SMALL (A) 03/27/2024 1122   KETONESUR NEGATIVE 03/27/2024 1122   PROTEINUR >300 (A) 03/27/2024 1122   UROBILINOGEN 0.2 08/31/2019 1136  NITRITE NEGATIVE 03/27/2024 1122   LEUKOCYTESUR TRACE (A) 03/27/2024 1122   Sepsis Labs: @LABRCNTIP (procalcitonin:4,lacticidven:4)  )No results found for this or any previous visit (from the past 240 hours).   Radiology Studies: US  Abdomen Limited RUQ (LIVER/GB) Result Date: 04/17/2024 CLINICAL DATA:  Elevated LFTs. EXAM: ULTRASOUND ABDOMEN LIMITED RIGHT UPPER QUADRANT COMPARISON:  CT 04/07/2024 FINDINGS: Gallbladder: Physiologically distended. No definite gallstones seen on the current exam. Echogenicity in the gallbladder neck which is difficult to accurately assess on the current exam, suspect sludge. Mild gallbladder wall thickening at 3.8 mm. No sonographic Murphy sign noted by sonographer. Common bile duct: Diameter: 4-5 mm, normal. Liver: No focal lesion identified. Mildly heterogeneous in parenchymal echogenicity. Portal vein is patent on color Doppler imaging with normal direction of blood flow towards the liver. Other: Trace right upper quadrant ascites. Right pleural effusion noted. Patient had difficulty with positioning. IMPRESSION: 1. Nonspecific gallbladder wall thickening in the setting of ascites. No specific findings of acute cholecystitis. 2. Suspect layering sludge in the gallbladder neck. 3. Trace  right upper quadrant ascites. Electronically Signed   By: Andrea Gasman M.D.   On: 04/17/2024 10:00     Scheduled Meds:  atorvastatin   80 mg Per Tube Daily   carvedilol   3.125 mg Per Tube BID WC   Chlorhexidine  Gluconate Cloth  6 each Topical Daily   clonazePAM   0.5 mg Per Tube BID   ezetimibe   10 mg Per Tube Daily   famotidine   20 mg Per Tube Daily   feeding supplement (PROSource TF20)  60 mL Per Tube Daily   fiber  1 packet Per Tube BID   [name] butt cream   Topical BID   guaiFENesin   10 mL Per Tube Q4H   insulin  aspart  0-9 Units Subcutaneous Q4H   lidocaine   1 patch Transdermal Q24H   multivitamin  1 tablet Per Tube QHS   pantoprazole  (PROTONIX ) IV  40 mg Intravenous Q12H   predniSONE   20 mg Oral Q breakfast   Followed by   NOREEN ON 04/21/2024] predniSONE   10 mg Oral Q breakfast   QUEtiapine   12.5 mg Per Tube QHS   saccharomyces boulardii  250 mg Per Tube BID   sodium chloride  flush  10-40 mL Intracatheter Q12H   torsemide   100 mg Oral Daily   Continuous Infusions:  feeding supplement (VITAL 1.5 CAL) 1,000 mL (04/17/24 0831)   promethazine  (PHENERGAN ) injection (IM or IVPB) 12.5 mg (04/05/24 1153)     LOS: 87 days    Time spent:    Sigurd Pac, MD [facility]   04/17/2024, 1:11 PM    "

## 2024-04-17 NOTE — Progress Notes (Cosign Needed Addendum)
 "  Rounding Note   Patient Name: Billy [name] Date of Encounter: 04/17/2024  [facility] Cardiologist: Dorn Lesches, MD   Subjective  S/p dilaudid  this morning for some belly discomfort > better No CP, denies SOB Looks comfortable  Scheduled Meds:  atorvastatin   80 mg Per Tube Daily   carvedilol   3.125 mg Per Tube BID WC   Chlorhexidine  Gluconate Cloth  6 each Topical Daily   clonazePAM   0.5 mg Per Tube BID   ezetimibe   10 mg Per Tube Daily   famotidine   20 mg Per Tube Daily   feeding supplement (PROSource TF20)  60 mL Per Tube Daily   fiber  1 packet Per Tube BID   [name] butt cream   Topical BID   guaiFENesin   10 mL Per Tube Q4H   insulin  aspart  0-9 Units Subcutaneous Q4H   lidocaine   1 patch Transdermal Q24H   mexiletine  200 mg Oral Q12H   multivitamin  1 tablet Per Tube QHS   pantoprazole  (PROTONIX ) IV  40 mg Intravenous Q12H   predniSONE   20 mg Oral Q breakfast   Followed by   NOREEN ON 04/21/2024] predniSONE   10 mg Oral Q breakfast   QUEtiapine   12.5 mg Per Tube QHS   saccharomyces boulardii  250 mg Per Tube BID   sodium chloride  flush  10-40 mL Intracatheter Q12H   torsemide   100 mg Oral Daily   Continuous Infusions:  amiodarone  30 mg/hr (04/17/24 0359)   feeding supplement (VITAL 1.5 CAL) 1,000 mL (04/17/24 0831)   promethazine  (PHENERGAN ) injection (IM or IVPB) 12.5 mg (04/05/24 1153)   PRN Meds: acetaminophen  (TYLENOL ) oral liquid 160 mg/5 mL, diclofenac  Sodium, [COMPLETED] diphenoxylate -atropine  **FOLLOWED BY** diphenoxylate -atropine , glucagon  (human recombinant), HYDROmorphone  (DILAUDID ) injection, hydrOXYzine , lip balm, mouth rinse, oxyCODONE , polyethylene glycol, promethazine  (PHENERGAN ) injection (IM or IVPB), simethicone , sodium chloride , sodium chloride  flush, traZODone    Vital Signs  Vitals:   04/16/24 1900 04/16/24 2300 04/17/24 0300 04/17/24 0733  BP: (!) 126/97 (!) 145/69 (!) 142/60 (!) 153/68  Pulse: 86 72 63 75  Resp: 20 20 20     Temp: 97.9 F (36.6 C) 97.8 F (36.6 C) 98.3 F (36.8 C) 98 F (36.7 C)  TempSrc: Oral Oral Oral Oral  SpO2: 100% 100% 100%   Weight:   75.9 kg   Height:   6' 1 (1.854 m)     Intake/Output Summary (Last 24 hours) at 04/17/2024 0842 Last data filed at 04/17/2024 0300 Gross per 24 hour  Intake 240 ml  Output 600 ml  Net -360 ml      04/17/2024    3:00 AM 04/16/2024    3:00 AM 04/15/2024    4:28 PM  Last 3 Weights  Weight (lbs) 167 lb 5.3 oz 167 lb 12.3 oz 169 lb 8.5 oz  Weight (kg) 75.9 kg 76.1 kg 76.9 kg      Telemetry  AFib 70's mostly, no brady or tachy. Waxing/waning PVCs, couples, 3 beat salvos, no other NSVT or sustained VT since yesterday morning   - Personally Reviewed  ECG   No new EKGs - Personally Reviewed    Physical Exam  Seen and examined by Dr. Almetta this AM GEN: No acute distress, chronically ill appearing.   Neck: No JVD Cardiac: RRR, no murmurs, rubs, or gallops.  Respiratory: CTA b/l. GI: belly is Soft, nontender, PEG in place is clean/dry, appears unchanged from yesterday MS: trace edema; No deformity. Neuro:  Nonfocal  Psych: Normal affect  Labs High Sensitivity Troponin:  No results for input(s): TROPONINIHS in the last 720 hours. No results for input(s): TRNPT in the last 720 hours.     Chemistry Recent Labs  Lab 04/14/24 1455 04/15/24 0628 04/15/24 1004 04/15/24 1150 04/16/24 0603 04/17/24 0416  NA 132* 135  --  133* 135 133*  K 4.7 4.6  --  4.8 4.2 4.3  CL 94* 94*  --  93* 96* 94*  CO2 28 28  --  28 30 28   GLUCOSE 205* 188*  --  256* 159* 134*  BUN 77* 85*  --  91* 62* 85*  CREATININE 3.13* 3.29*  --  3.49* 2.58* 3.20*  CALCIUM  7.9* 7.8*  --  7.7* 7.8* 7.9*  MG 2.0  --  2.2  --   --  2.3  PROT  --   --   --   --  4.8* 4.9*  ALBUMIN   --  2.1*  --   --  2.0* 1.9*  AST  --   --   --   --  249* 251*  ALT  --   --   --   --  356* 379*  ALKPHOS  --   --   --   --  1,116* 1,332*  BILITOT  --   --   --   --  1.2  1.3*  GFRNONAA 20* 19*  --  18* 26* 20*  ANIONGAP 11 13  --  12 9 12     Lipids No results for input(s): CHOL, TRIG, HDL, LABVLDL, LDLCALC, CHOLHDL in the last 168 hours.  Hematology Recent Labs  Lab 04/15/24 1150 04/16/24 0603 04/17/24 0416  WBC 14.8* 11.5* 15.2*  RBC 2.83* 2.79* 2.82*  HGB 7.7* 7.7* 7.7*  HCT 23.7* 23.3* 23.3*  MCV 83.7 83.5 82.6  MCH 27.2 27.6 27.3  MCHC 32.5 33.0 33.0  RDW 16.9* 17.4* 17.4*  PLT 195 186 222   Thyroid  No results for input(s): TSH, FREET4 in the last 168 hours.  BNPNo results for input(s): BNP, PROBNP in the last 168 hours.  DDimer No results for input(s): DDIMER in the last 168 hours.   Radiology    Liver US  12/30: pending read  DG CHEST PORT 1 VIEW Result Date: 04/15/2024 EXAM: 1 VIEW(S) XRAY OF THE CHEST 04/15/2024 07:56:00 AM COMPARISON: 04/14/2024. CLINICAL HISTORY: Acute respiratory distress. FINDINGS: LINES, TUBES AND DEVICES: Right arm PICC line tip is in the projection of the SVC. There is also a right IJ dialysis catheter with tip at the superior cavoatrial junction. Loop recorder is noted in the projection of the left heart border. LUNGS AND PLEURA: Unchanged small bilateral pleural effusions. Persistent bilateral interstitial and airspace opacities. No pneumothorax. HEART AND MEDIASTINUM: No acute abnormality of the cardiac and mediastinal silhouettes. BONES AND SOFT TISSUES: No acute osseous abnormality. IMPRESSION: 1. Persistent bilateral interstitial and airspace opacities. 2. Unchanged small bilateral pleural effusions. Electronically signed by: Waddell Calk MD 04/15/2024 08:07 AM EST RP Workstation: [id number]   DG Chest Port 1 View Result Date: 04/14/2024 CLINICAL DATA:  Pulmonary edema. EXAM: PORTABLE CHEST 1 VIEW COMPARISON:  Radiograph yesterday FINDINGS: Stable positioning of right-sided dialysis catheter and right upper extremity PICC. Slight improvement in heterogeneous interstitial opacities.  Cardiomegaly is stable. Small pleural effusions are unchanged. Left chest wall loop recorder. No pneumothorax. IMPRESSION: 1. Slight improvement in heterogeneous interstitial opacities, likely improving pulmonary edema. 2. Stable small pleural effusions. Electronically Signed   By: Andrea Gasman M.D.   On:  04/14/2024 16:42    Cardiac Studies   04/15/24: limited echo 1. Left ventricular ejection fraction, by estimation, is 60 to 65%. The  left ventricle has normal function. There is mild concentric left  ventricular hypertrophy. Left ventricular diastolic function could not be  evaluated.   2. Right ventricular systolic function is normal. The right ventricular  size is normal.   3. The mitral valve is normal in structure. No evidence of mitral valve  regurgitation.   4. The aortic valve is tricuspid. Aortic valve regurgitation is not  visualized. Aortic valve sclerosis/calcification is present, without any  evidence of aortic stenosis.   5. The inferior vena cava is normal in size with greater than 50%  respiratory variability, suggesting right atrial pressure of 3 mmHg.    TTE Result date: 03/27/24  1. Left ventricular ejection fraction, by estimation, is 55 to 60%. Left  ventricular ejection fraction by 2D MOD biplane is 59.1 %. The left  ventricle has normal function. The left ventricle demonstrates regional  wall motion abnormalities with basal  inferior akinesis and basal inferolateral akinesis. There is moderate  concentric left ventricular hypertrophy. Left ventricular diastolic  parameters are consistent with Grade II diastolic dysfunction  (pseudonormalization).   2. Right ventricular systolic function is mildly reduced. The right  ventricular size is normal. Tricuspid regurgitation signal is inadequate  for assessing PA pressure.   3. The mitral valve is normal in structure. No evidence of mitral valve  regurgitation. No evidence of mitral stenosis.   4. The aortic  valve is tricuspid. There is moderate calcification of the  aortic valve. Aortic valve regurgitation is not visualized. Aortic valve  sclerosis/calcification is present, without any evidence of aortic  stenosis.   5. The inferior vena cava is normal in size with greater than 50%  respiratory variability, suggesting right atrial pressure of 3 mmHg.    cMRI  Result date: 07/07/23 1. Study is less consistent with cardiac amyloidosis or infiltrative disease. Study is more consistent with inferolateral territory RCA or LCX disease (with ECV elevation but without notable scar). Though septum is 15 mm, there are no other class features consistent with hypertrophic cardiomyopathy. 2.  Low normal LVEF (52%) with mild LV dilation. 3.  Right ventricular dilation with normalization of function. 4. Severe right atrial dilation. Mild pulmonary artery dilation. Moderate tricuspid regurgitation.     Patient Profile   73 y.o. 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 He started having some NSVTs and sustained MMVT here and amiodarone  (chronic for him) > gtt, mexiletine  started Last sustained VT was 12/29 morning PVC burden much improved as well LFTs up (liver US  done, pending read) Stop  amio gtt I will d/w Dr. Almetta +/- PO amio plans Continue mexiletine   3. HFpEF 2,000 removed 1229 with HD Volume with nephrology team Back up on O2 5-7L generally   ADDEND: In d/w Dr. Almetta Given LFT rise > stop amiodarone  and mexiletine, follow rhythm closely If more ectopy > VT > can consider change to toprol    For questions or updates, please contact [facility] Please consult [email or web address] for contact info under   Signed, Charlies Macario Arthur, PA-C  04/17/2024, 8:42 AM    "

## 2024-04-17 NOTE — Progress Notes (Signed)
  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  He has completed rounds of antibiotics.  Mild interstitial fibrosis/tubular atrophy.  Given concern for infection we provided steroids but avoided other IS medications due to weighing risks/benefits on pred taper now with plans to wean quickly given the main concern is infection as a cause he was on minoxidil  which caries similar risk for AI kidney phenomena as hydralazine . Stopped this (last dose 12/22)  some signs of renal recovery with nonoliguric increasing urine output; BUN and creatinine slightly increasing -did receive contrast on 12/21 which may hinder Crt improvement Not yet ready to hold dialysis, HD today.    Petechial rash;             - 2/2 Iga. Mgmt as above   Acute on chronic CHF exacerbation;             - HD and diuretics as aboven daily torsemide    H/o DVT and PE:             - Anticoagulation per cardiology and primary service   Boorehave's syndrome             - s/p repair             - Has a PEG  - EGD 03/19/24 with ulcers  - PPI and pepcid    HTN  - Carvedilol , followed BPs with [facility]  Dispo: Likely SNF, will need HD unit as well as AKI.  Renal navigator following.   Subjective:   No major interval events 0.8 L urine output Creatinine was 2.6 and now up to 3.2, K4.3, BUN 85. Alkaline phosphatase further elevated, AST and ALT elevated as well.   Objective:   BP (!) 153/68 (BP Location: Left Arm)   Pulse 75   Temp 98 F (36.7 C) (Oral)   Resp 20   Ht 6' 1 (1.854 m)   Wt 75.9 kg   SpO2 100%   BMI 22.08 kg/m   Intake/Output  Summary (Last 24 hours) at 04/17/2024 1111 Last data filed at 04/17/2024 0900 Gross per 24 hour  Intake 360 ml  Output 600 ml  Net -240 ml    Weight change: -2.4 kg  Physical Exam: GEN: Lying in bed, mild increased work of breathing/respiratory rate PULM: As above, diminished in bases CV: Normal rate, regular rhythm ABD: + PEG, mild distention EXT: 1+  edema in the lower extremities, 1+ in the bilateral upper extremities NEURO: awake, alert, interactive Dialysis access: RIJ HD catheter  Imaging: US  Abdomen Limited RUQ (LIVER/GB) Result Date: 04/17/2024 CLINICAL DATA:  Elevated LFTs. EXAM: ULTRASOUND ABDOMEN LIMITED RIGHT UPPER QUADRANT COMPARISON:  CT 04/07/2024 FINDINGS: Gallbladder: Physiologically distended. No definite gallstones seen on the current exam. Echogenicity in the gallbladder neck which is difficult to accurately assess on the current exam, suspect sludge. Mild gallbladder wall thickening at 3.8 mm. No sonographic Murphy sign noted by sonographer. Common bile duct: Diameter: 4-5 mm, normal. Liver: No focal lesion identified. Mildly heterogeneous in parenchymal echogenicity. Portal vein is patent on color Doppler imaging with normal direction of blood flow towards the liver. Other: Trace right upper quadrant ascites. Right pleural effusion noted.  Patient had difficulty with positioning. IMPRESSION: 1. Nonspecific gallbladder wall thickening in the setting of ascites. No specific findings of acute cholecystitis. 2. Suspect layering sludge in the gallbladder neck. 3. Trace right upper quadrant ascites. Electronically Signed   By: Andrea Gasman M.D.   On: 04/17/2024 10:00   ECHOCARDIOGRAM LIMITED Result Date: 04/15/2024    ECHOCARDIOGRAM LIMITED REPORT   Patient Name:   Billy [name] Date of Exam: 04/15/2024 Medical Rec #:  [id number]  Height:       73.0 in Accession #:    [id number] Weight:       167.5 lb Date of Birth:  Oct 26, 1950  BSA:          1.996 m Patient Age:    73 years    BP:           161/70 mmHg Patient Gender: M          HR:           83 bpm. Exam Location:  Inpatient Procedure: Limited Echo Indications:    Ventricular Tachycardia I47.2  History:        Patient has prior history of Echocardiogram examinations, most                 recent 03/27/2024. Arrythmias:Atrial Fibrillation; Risk                 Factors:Hypertension.  Sonographer:    Jayson Gaskins Referring Phys: [id number] RENEE LYNN URSUY IMPRESSIONS  1. Left ventricular ejection fraction, by estimation, is 60 to 65%. The left ventricle has normal function. There is mild concentric left ventricular hypertrophy. Left ventricular diastolic function could not be evaluated.  2. Right ventricular systolic function is normal. The right ventricular size is normal.  3. The mitral valve is normal in structure. No evidence of mitral valve regurgitation.  4. The aortic valve is tricuspid. Aortic valve regurgitation is not visualized. Aortic valve sclerosis/calcification is present, without any evidence of aortic stenosis.  5. The inferior vena cava is normal in size with greater than 50% respiratory variability, suggesting right atrial pressure of 3 mmHg. FINDINGS  Left Ventricle: Left ventricular ejection fraction, by estimation, is 60 to 65%. The left ventricle has normal function. There is mild concentric left ventricular hypertrophy. Left ventricular diastolic function could not be evaluated. Left ventricular diastolic function could not be evaluated due to nondiagnostic images. Right Ventricle: The right ventricular size is normal. Right ventricular systolic function is normal. Pericardium: There is no evidence of pericardial effusion. Mitral Valve: The mitral valve is normal in structure. Aortic Valve: The aortic valve is tricuspid. Aortic valve regurgitation is not visualized. Aortic valve sclerosis/calcification is present, without any evidence of aortic stenosis. Aorta: The aortic root is normal in size and structure. Venous:  The inferior vena cava is normal in size with greater than 50% respiratory variability, suggesting right atrial pressure of 3 mmHg. LEFT VENTRICLE PLAX 2D LVIDd:         4.60 cm LVIDs:         3.30 cm LV PW:         1.20 cm LV IVS:        1.20 cm LVOT diam:     2.10 cm LVOT Area:     3.46 cm   SHUNTS Systemic Diam: 2.10 cm Morene Brownie Electronically signed by Morene Brownie Signature Date/Time: 04/15/2024/5:56:35 PM    Final      Labs: BMET Recent Labs  Lab 04/11/24 1231 04/12/24 0247  04/13/24 0520 04/14/24 0500 04/14/24 1455 04/15/24 0628 04/15/24 1150 04/16/24 0603 04/17/24 0416  NA 133* 134* 131* 135 132* 135 133* 135 133*  K 5.6* 5.7* 4.8 4.1 4.7 4.6 4.8 4.2 4.3  CL 95* 95* 93* 96* 94* 94* 93* 96* 94*  CO2 24 27 28 30 28 28 28 30 28   GLUCOSE 175* 152* 88 172* 205* 188* 256* 159* 134*  BUN 105* 111* 80* 65* 77* 85* 91* 62* 85*  CREATININE 4.39* 4.61* 3.37* 2.77* 3.13* 3.29* 3.49* 2.58* 3.20*  CALCIUM  8.2* 8.1* 7.8* 7.5* 7.9* 7.8* 7.7* 7.8* 7.9*  PHOS 3.3 3.7 4.2 3.8  --  3.3  --   --   --    CBC Recent Labs  Lab 04/14/24 1455 04/15/24 1150 04/16/24 0603 04/17/24 0416  WBC 12.5* 14.8* 11.5* 15.2*  NEUTROABS 11.4*  --   --   --   HGB 9.8* 7.7* 7.7* 7.7*  HCT 29.9* 23.7* 23.3* 23.3*  MCV 84.2 83.7 83.5 82.6  PLT 177 195 186 222    Medications:     atorvastatin   80 mg Per Tube Daily   carvedilol   3.125 mg Per Tube BID WC   Chlorhexidine  Gluconate Cloth  6 each Topical Daily   clonazePAM   0.5 mg Per Tube BID   ezetimibe   10 mg Per Tube Daily   famotidine   20 mg Per Tube Daily   feeding supplement (PROSource TF20)  60 mL Per Tube Daily   fiber  1 packet Per Tube BID   [name] butt cream   Topical BID   guaiFENesin   10 mL Per Tube Q4H   insulin  aspart  0-9 Units Subcutaneous Q4H   lidocaine   1 patch Transdermal Q24H   mexiletine  200 mg Oral Q12H   multivitamin  1 tablet Per Tube QHS   pantoprazole  (PROTONIX ) IV  40 mg Intravenous Q12H   predniSONE   20  mg Oral Q breakfast   Followed by   NOREEN ON 04/21/2024] predniSONE   10 mg Oral Q breakfast   QUEtiapine   12.5 mg Per Tube QHS   saccharomyces boulardii  250 mg Per Tube BID   sodium chloride  flush  10-40 mL Intracatheter Q12H   torsemide   100 mg Oral Daily

## 2024-04-17 NOTE — Progress Notes (Signed)
" °   04/17/24 1555  Vitals  Temp 97.6 F (36.4 C)  Temp Source Oral  BP Location Left Arm  BP Method Automatic  Patient Position (if appropriate) Lying  Pulse Rate 89  Pulse Rate Source Monitor  ECG Heart Rate (!) 103  Resp (!) 37  Oxygen  Therapy  SpO2 99 %  O2 Device HFNC  O2 Flow Rate (L/min) 7 L/min  Pulse Oximetry Type Continuous  Oximetry Probe Site Changed No  During Treatment Monitoring  Blood Flow Rate (mL/min) 399 mL/min  Arterial Pressure (mmHg) -175.34 mmHg  Venous Pressure (mmHg) 182.41 mmHg  TMP (mmHg) 1.01 mmHg  Ultrafiltration Rate (mL/min) 896 mL/min  Dialysate Flow Rate (mL/min) 300 ml/min  Duration of HD Treatment -hour(s) 2.95 hour(s)  Cumulative Fluid Removed (mL) per Treatment  1950.58  HD Safety Checks Performed Yes  Intra-Hemodialysis Comments Tx completed;Tolerated well  Post Treatment  Dialyzer Clearance Other (Comment)  Hemodialysis Intake (mL)  (yellow hue)  Liters Processed 72  Fluid Removed (mL) 2000 mL  Tolerated HD Treatment Yes  Hemodialysis Catheter Right Internal jugular Double lumen Permanent (Tunneled)  Placement Date/Time: 04/04/24 1605   Serial / Lot #: [id number]  Expiration Date: 12/16/28  Time Out: Correct patient;Correct site;Correct procedure  Maximum sterile barrier precautions: Hand hygiene;Cap;Mask;Sterile gown;Sterile gloves;Large sterile s...  Site Condition No complications  Blue Lumen Status Antimicrobial dead end cap;Heparin  locked  Red Lumen Status Antimicrobial dead end cap;Heparin  locked  Catheter fill solution Heparin  1000 units/ml  Catheter fill volume (Arterial) 1.9 cc  Catheter fill volume (Venous) 1.9  Dressing Type Transparent  Dressing Status Clean, Dry, Intact  Drainage Description Black  Dressing Change Due 04/19/24  Post treatment catheter status Capped and Clamped    "

## 2024-04-17 NOTE — Plan of Care (Signed)
  Problem: Clinical Measurements: Goal: Will remain free from infection Outcome: Progressing   Problem: Coping: Goal: Level of anxiety will decrease Outcome: Progressing   

## 2024-04-17 NOTE — Plan of Care (Signed)
   Problem: Education: Goal: Knowledge of General Education information will improve Description Including pain rating scale, medication(s)/side effects and non-pharmacologic comfort measures Outcome: Progressing

## 2024-04-17 NOTE — Progress Notes (Signed)
 OT Cancellation Note  Patient Details Name: Billy [name] MRN: [id number] DOB: Jun 26, 1950   Cancelled Treatment:    Reason Eval/Treat Not Completed: Patient at procedure or test/ unavailable (HD)  [name] K, OTD, OTR/L SecureChat [facility] [phone]   [name] 04/17/2024, 1:19 PM

## 2024-04-17 NOTE — Progress Notes (Addendum)
 Pt has been accepted to start at [facility] for out-pt HD on a MWF schedule 1125am chair time. Tentative start date 04/27/23 pending d/c, will need to arrive 1045am first day. Spoke with clinic manager who stated start date could likely be moved out to sooner if pt ready for d/c sooner.   Met with pt at bedside, provided schedule letter and informed pt that his start date may change, but times and address will remain the same. Upon this discussion, pt expressed confusion in that he did not want to drive 2+hours away for dialysis, Navigator reassured pt that this clinic is in [place] and his SNF will transport to and from. Pt expressed confusion in him not being able to go and visit the clinic beforehand and asked about it's ratings, navigator informed it is not possible to visit beforehand, and that if he gets there and is unhappy for any reason, he can request a transfer, pt stated agreeance and understanding and had no further questions at this time.   Will update care team and AVS.  Billy [name] Dialysis nav [phone]

## 2024-04-17 NOTE — TOC Progression Note (Signed)
 Transition of Care [facility]) - Progression Note    Patient Details  Name: Billy [name] MRN: [id number] Date of Birth: [birth year]  [facility]) CM/SW Contact  [name], [place] Phone Number: 04/17/2024, 9:49 AM  Clinical Narrative:   Per daily meeting with treatment team, patient on 7L O2, tube feeds. Per [facility], they can only take patient on 4L O2 or less. CSW will await to start insurance auth until pt is more medically stable for discharge.  CSW will continue to follow.    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, SNF Pending bed offer               Expected Discharge Plan and Services In-house Referral: Clinical Social Work Discharge Planning Services: [employer] Consult Post Acute Care Choice: Skilled Nursing Facility Living arrangements for the past 2 months: Single Family Home                                       Social Drivers of Health (SDOH) Interventions SDOH Screenings   Food Insecurity: No Food Insecurity (01/22/2024)  Housing: High Risk (01/22/2024)  Transportation Needs: No Transportation Needs (01/22/2024)  Utilities: Not At Risk (01/22/2024)  Social Connections: Socially Isolated (01/22/2024)  Tobacco Use: Low Risk (03/19/2024)    Readmission Risk Interventions    06/27/2023    1:52 PM 10/14/2022   11:29 AM  Readmission Risk Prevention Plan  Post Dischage Appt  Complete  Medication Screening  Complete  Transportation Screening Complete Complete  HRI or Home Care Consult Complete   Social Work Consult for Recovery Care Planning/Counseling Complete   Palliative Care Screening Not Applicable   Medication Review Oceanographer) Referral to Pharmacy

## 2024-04-18 DIAGNOSIS — I4819 Other persistent atrial fibrillation: Secondary | ICD-10-CM | POA: Diagnosis not present

## 2024-04-18 DIAGNOSIS — I472 Ventricular tachycardia, unspecified: Secondary | ICD-10-CM | POA: Diagnosis not present

## 2024-04-18 DIAGNOSIS — K223 Perforation of esophagus: Secondary | ICD-10-CM | POA: Diagnosis not present

## 2024-04-18 LAB — GLUCOSE, CAPILLARY
Glucose-Capillary: 142 mg/dL — ABNORMAL HIGH (ref 70–99)
Glucose-Capillary: 147 mg/dL — ABNORMAL HIGH (ref 70–99)
Glucose-Capillary: 185 mg/dL — ABNORMAL HIGH (ref 70–99)
Glucose-Capillary: 154 mg/dL — ABNORMAL HIGH (ref 70–99)
Glucose-Capillary: 127 mg/dL — ABNORMAL HIGH (ref 70–99)
Glucose-Capillary: 107 mg/dL — ABNORMAL HIGH (ref 70–99)

## 2024-04-18 LAB — COMPREHENSIVE METABOLIC PANEL WITH GFR
ALT: 492 U/L — ABNORMAL HIGH (ref 0–44)
AST: 346 U/L — ABNORMAL HIGH (ref 15–41)
Albumin: 1.9 g/dL — ABNORMAL LOW (ref 3.5–5.0)
Alkaline Phosphatase: 1782 U/L — ABNORMAL HIGH (ref 38–126)
Anion gap: 9 (ref 5–15)
BUN: 59 mg/dL — ABNORMAL HIGH (ref 8–23)
CO2: 30 mmol/L (ref 22–32)
Calcium: 7.9 mg/dL — ABNORMAL LOW (ref 8.9–10.3)
Chloride: 95 mmol/L — ABNORMAL LOW (ref 98–111)
Creatinine, Ser: 2.54 mg/dL — ABNORMAL HIGH (ref 0.61–1.24)
GFR, Estimated: 26 mL/min — ABNORMAL LOW
Glucose, Bld: 148 mg/dL — ABNORMAL HIGH (ref 70–99)
Potassium: 4.5 mmol/L (ref 3.5–5.1)
Sodium: 134 mmol/L — ABNORMAL LOW (ref 135–145)
Total Bilirubin: 1.9 mg/dL — ABNORMAL HIGH (ref 0.0–1.2)
Total Protein: 5 g/dL — ABNORMAL LOW (ref 6.5–8.1)

## 2024-04-18 LAB — CBC
HCT: 23.2 % — ABNORMAL LOW (ref 39.0–52.0)
Hemoglobin: 7.6 g/dL — ABNORMAL LOW (ref 13.0–17.0)
MCH: 27 pg (ref 26.0–34.0)
MCHC: 32.8 g/dL (ref 30.0–36.0)
MCV: 82.3 fL (ref 80.0–100.0)
Platelets: 238 K/uL (ref 150–400)
RBC: 2.82 MIL/uL — ABNORMAL LOW (ref 4.22–5.81)
RDW: 17.9 % — ABNORMAL HIGH (ref 11.5–15.5)
WBC: 14.6 K/uL — ABNORMAL HIGH (ref 4.0–10.5)
nRBC: 0 % (ref 0.0–0.2)

## 2024-04-18 MED ORDER — ENOXAPARIN SODIUM 30 MG/0.3ML IJ SOSY
30.0000 mg | PREFILLED_SYRINGE | INTRAMUSCULAR | Status: DC
  Administered 2024-04-18: 30 mg via SUBCUTANEOUS
  Filled 2024-04-18: qty 0.3

## 2024-04-18 NOTE — Progress Notes (Addendum)
 " PROGRESS NOTE    Billy [name]  FMW:[id number] DOB: [birth year] DOA: 01/21/2024 PCP: Seabron Lenis, MD  Chronically ill 73/M prolonged hospitalization from 3/10-4/3 with cardiac arrest, A-fib, DVTs, hematochezia, IVC filter, chest tubes, eventually stabilized, discharged to rehab and then went home, readmitted 10/5 with large esophageal tear 10/7 esophageal stent placed by cardiothoracic 10/15 stent reposition 10/16 extubated 10/20 reintubated with ultrasound showing left-sided pleural effusion-left-sided chest tube placed-by lights criteria exudative effusion 10/23 echo 60-65% 10/24 barium swallow showed persistent leak 10/28 transferred out of ICU 11/3 PEG tube placed 11/19 swelling of right arm showing on ultrasound SVT cephalic vein 11/22 chest x-ray?  Pneumonia done because of back pain-follow-up CT scan shows stent in place no mediastinal fluid collection small bilateral pleural effusions with bibasilar atelectasis pneumonia not excluded-scattered clusters nodular densities predominantly right upper and right lower lobes with nodule 11 mm as previously seen?  Mets?  Infection?  Aspiration 11/26 stent removed. Esophagram negative for esophageal leak.  11/27 getting diuresis again 11/28 duodenitis/enteritis.  GI consulted. Bloody emesis added carafate   11/29 trialing clears 12/1 NV again w/ hematemesis. Lasix  held ---  esophagram performed showing patent esophagus without perforation irregularity mid esophagus?  Stent diffuse narrowing lumen distal duodenum and proximal jejunum 12/2 EGD ischemic ulcers. Impression:  - Congested, inflamed, nodular, texture changed  mucosa in the esophagus. - Gastritis, Non-bleeding duodenal ulcers with a clean ulcer  base (Forrest Class III).- Non-bleeding jejunal ulcers with a clean ulcer   base (Forrest Class III)  - Findings suggestive of ischemic ulcers. Increased carafate  and cont BID PPI  12/3 full liq diet  12/4 marked increased WOB. Acute and  progressive w/ increased hypoxia and HTN. 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 12/17 off clev transition to iHD, transfer to floor  12/18: TDC placed 12/19 [name] assumed care, has distended and painful abdomen, KUB shows improvement so tube feeds resumed, Eliquis  resumed 12/22 hemoccult neg---transfused 1 U--- Eliquis  had to be held in the setting 12/23 vascular consulted secondary to lower extremities----x-ray shows congestion no pneumonia white count up-- 12/24 ABIs showed abnormal right and left toe brachial indices-no intervention from vascular required 12/26 severe respiratory distress needing nonrebreather 12/29, EP consulted for recurrent ventricular tachycardia, recurrent A-fib RVR> Amio gtt. 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, felt to be secondary to infection  - Completed course of antibiotics  - Currently on steroids per nephrology  - sp HD yesterday, monitor for renal recovery -  Also consulting palliative care with all above ongoing issues and high risk of decompensation  Nonspecific leukocytosis -Afebrile, has completed multiple rounds of antibiotics -  Ongoing prednisone  could be contributing, unfortunately with abnormal LFTs and mild abdominal tenderness chronic cholecystitis could also be a possibility, RUQ US  q/ GB wall thickening,  checking HIDA scan today, d/w Nuc med since he got narcotics this afternoon HIDA cannot be done today, plan for am  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  Severe Malnutrition-BMI 22 evidence of weight loss -S/p PEG -Continue dysphagia 3 diet , tolerating TF. Encourage po as tolerated  Normocytic anemia secondary to peptic ulcer disease,?  Ongoing gastritis as above -Transfused PRBC x 1u in October, x1 unit 03/29/2024,12/23, 12/28  Cardiac arrest 06/2023 with HF MR EF 55-65% 03/27/2024  A-fib RVR CHADVASC >4 on Eliquis  [cardioverted 06/2023]--- recurrent RVR at times -Eliquis  has been held since 12/22  -cardiology following again for recurrent A-fib RVR -Now on low-dose Coreg , amiodarone  and mexiletine discontinued 12/31 with abnormal LFTs -Restart anticoagulation with heparin  tomorrow if hemoglobin stays stable and no acute findings on HIDA scan   DVT PE March 2025 -does have IVC filter in place since March Lower limb ischemia ruled out 12.23 ABI rules out ischemia-appreciate VVS--no intervention needed  Insomnia - Continued on regimen of trazodone  Seroquel  and hydroxyzine   Diabetes - CBG stable  Multiple bedsores and wounds anterior right and left foot and injury stage I to bottom -Continue  Gerhardt twice daily Lidoderm  patch for back pain  VT - recurrent runs of VT 12/28 - Cards following, amiodarone  and mexiletine discontinued with rising LFTs, continue low-dose Coreg , recommended Toprol  if he has recurrence   Elevated LFT's -[place] phos 1116, AST 249, ALT 356 -On CT abd from 12/21 with mild biliary ductal dilation vs mild periportal edema and tiny layering stone in GB neck - Right upper quadrant ultrasound is nonspecific, ascites and gallbladder wall thickening, has mild epigastric and right sided tenderness on exam will check HIDA scan   Resolved/stable problems Petechial rash Hydralazine  and Unasyn  have been stopped, cannot rule out HSP-this seems unlikely to me however he has mottling in general over his skin-see the pictures from 12/22 of his feet-seem stable to me Acute hypoxic respiratory failure//Aspiration Pneumonia//Pleural effusion//Acute on chronic diastolic CHF exacerbation       -respiratory failure due to pneumonia, pleural effusion, CHF exacerbation -- Better c Lasix  and antibiotics. Intemrittently seems to have this Bilateral UE swelling--duplex upper extremities ruled out DVT Lung nodules-repeat CT in Jan/Feb window       DVT prophylaxis: add lovenox  Code Status: FUll Code Family Communication: none present Disposition Plan: SNF if he improves   Consultants:    Procedures:   Antimicrobials:    Objective: Vitals:   04/18/24 0510 04/18/24 0735 04/18/24 0754 04/18/24 0830  BP:  (!) 140/72 (!) 140/72   Pulse:  84 79   Resp: 18 (!) 25    Temp:  98 F (36.7 C)    TempSrc:  Oral    SpO2:  96%  90%  Weight:      Height:        Intake/Output Summary (Last 24 hours) at 04/18/2024 1033 Last data filed at 04/18/2024 0830 Gross per 24 hour  Intake 70 ml  Output 2400 ml  Net -2330 ml   Filed Weights   04/17/24 1555 04/17/24  1557 04/18/24 0453  Weight: 76.8 kg 76.8 kg 78.1 kg    Examination:  General exam: Chronically ill-appearing, AAO  x 2 HEENT: Positive JVD, right IJ HD catheter CVS: S1-S2, irregular rhythm Lungs: Poor air movement, decreased at the bases Ab DeMent: Soft, mildly distended, PEG tube noted Extremities: 1+ edema Psychiatry: Flat affect    Data Reviewed:   CBC: Recent Labs  Lab 04/14/24 1455 04/15/24 1150 04/16/24 0603 04/17/24 0416 04/18/24 0423  WBC 12.5* 14.8* 11.5* 15.2* 14.6*  NEUTROABS 11.4*  --   --   --   --   HGB 9.8* 7.7* 7.7* 7.7* 7.6*  HCT 29.9* 23.7* 23.3* 23.3* 23.2*  MCV 84.2 83.7 83.5 82.6 82.3  PLT 177 195 186 222 238   Basic Metabolic Panel: Recent Labs  Lab 04/11/24 1231 04/12/24 0247 04/13/24 0520 04/14/24 0500 04/14/24 0509 04/14/24 1455 04/15/24 0628 04/15/24 1004 04/15/24 1150 04/16/24 0603 04/17/24 0416 04/18/24 0423  NA 133* 134* 131* 135  --  132* 135  --  133* 135 133* 134*  K 5.6* 5.7* 4.8 4.1  --  4.7 4.6  --  4.8 4.2 4.3 4.5  CL 95* 95* 93* 96*  --  94* 94*  --  93* 96* 94* 95*  CO2 24 27 28 30   --  28 28  --  28 30 28 30   GLUCOSE 175* 152* 88 172*  --  205* 188*  --  256* 159* 134* 148*  BUN 105* 111* 80* 65*  --  77* 85*  --  91* 62* 85* 59*  CREATININE 4.39* 4.61* 3.37* 2.77*  --  3.13* 3.29*  --  3.49* 2.58* 3.20* 2.54*  CALCIUM  8.2* 8.1* 7.8* 7.5*  --  7.9* 7.8*  --  7.7* 7.8* 7.9* 7.9*  MG  --   --   --   --  1.9 2.0  --  2.2  --   --  2.3  --   PHOS 3.3 3.7 4.2 3.8  --   --  3.3  --   --   --   --   --    GFR: Estimated Creatinine Clearance: 29.5 mL/min (A) (by C-G formula based on SCr of 2.54 mg/dL (H)). Liver Function Tests: Recent Labs  Lab 04/14/24 0500 04/15/24 0628 04/16/24 0603 04/17/24 0416 04/18/24 0423  AST  --   --  249* 251* 346*  ALT  --   --  356* 379* 492*  ALKPHOS  --   --  1,116* 1,332* 1,782*  BILITOT  --   --  1.2 1.3* 1.9*  PROT  --   --  4.8* 4.9* 5.0*  ALBUMIN  1.9* 2.1* 2.0* 1.9* 1.9*   No results for input(s): LIPASE, AMYLASE in the last 168 hours. No results for input(s): AMMONIA in the last 168  hours. Coagulation Profile: No results for input(s): INR, PROTIME in the last 168 hours. Cardiac Enzymes: No results for input(s): CKTOTAL, CKMB, CKMBINDEX, TROPONINI in the last 168 hours. BNP (last 3 results) No results for input(s): PROBNP in the last 8760 hours. HbA1C: No results for input(s): HGBA1C in the last 72 hours. CBG: Recent Labs  Lab 04/17/24 1648 04/17/24 2021 04/18/24 0027 04/18/24 0457 04/18/24 0734  GLUCAP 191* 157* 142* 147* 185*   Lipid Profile: No results for input(s): CHOL, HDL, LDLCALC, TRIG, CHOLHDL, LDLDIRECT in the last 72 hours. Thyroid  Function Tests: No results for input(s): TSH, T4TOTAL, FREET4, T3FREE, THYROIDAB in the last 72 hours. Anemia Panel: No  results for input(s): VITAMINB12, FOLATE, FERRITIN, TIBC, IRON , RETICCTPCT in the last 72 hours. Urine analysis:    Component Value Date/Time   COLORURINE YELLOW 03/27/2024 1122   APPEARANCEUR CLEAR 03/27/2024 1122   LABSPEC 1.025 03/27/2024 1122   PHURINE 5.5 03/27/2024 1122   GLUCOSEU NEGATIVE 03/27/2024 1122   HGBUR LARGE (A) 03/27/2024 1122   BILIRUBINUR SMALL (A) 03/27/2024 1122   KETONESUR NEGATIVE 03/27/2024 1122   PROTEINUR >300 (A) 03/27/2024 1122   UROBILINOGEN 0.2 08/31/2019 1136   NITRITE NEGATIVE 03/27/2024 1122   LEUKOCYTESUR TRACE (A) 03/27/2024 1122   Sepsis Labs: @LABRCNTIP (procalcitonin:4,lacticidven:4)  )No results found for this or any previous visit (from the past 240 hours).   Radiology Studies: US  Abdomen Limited RUQ (LIVER/GB) Result Date: 04/17/2024 CLINICAL DATA:  Elevated LFTs. EXAM: ULTRASOUND ABDOMEN LIMITED RIGHT UPPER QUADRANT COMPARISON:  CT 04/07/2024 FINDINGS: Gallbladder: Physiologically distended. No definite gallstones seen on the current exam. Echogenicity in the gallbladder neck which is difficult to accurately assess on the current exam, suspect sludge. Mild gallbladder wall thickening at 3.8 mm. No  sonographic Murphy sign noted by sonographer. Common bile duct: Diameter: 4-5 mm, normal. Liver: No focal lesion identified. Mildly heterogeneous in parenchymal echogenicity. Portal vein is patent on color Doppler imaging with normal direction of blood flow towards the liver. Other: Trace right upper quadrant ascites. Right pleural effusion noted. Patient had difficulty with positioning. IMPRESSION: 1. Nonspecific gallbladder wall thickening in the setting of ascites. No specific findings of acute cholecystitis. 2. Suspect layering sludge in the gallbladder neck. 3. Trace right upper quadrant ascites. Electronically Signed   By: Andrea Gasman M.D.   On: 04/17/2024 10:00     Scheduled Meds:  atorvastatin   80 mg Per Tube Daily   carvedilol   3.125 mg Per Tube BID WC   Chlorhexidine  Gluconate Cloth  6 each Topical Daily   clonazePAM   0.5 mg Per Tube BID   ezetimibe   10 mg Per Tube Daily   famotidine   20 mg Per Tube Daily   feeding supplement (PROSource TF20)  60 mL Per Tube Daily   fiber  1 packet Per Tube BID   [name] butt cream   Topical BID   guaiFENesin   10 mL Per Tube Q4H   insulin  aspart  0-9 Units Subcutaneous Q4H   lidocaine   1 patch Transdermal Q24H   multivitamin  1 tablet Per Tube QHS   pantoprazole  (PROTONIX ) IV  40 mg Intravenous Q12H   predniSONE   20 mg Oral Q breakfast   Followed by   NOREEN ON 04/21/2024] predniSONE   10 mg Oral Q breakfast   QUEtiapine   12.5 mg Per Tube QHS   saccharomyces boulardii  250 mg Per Tube BID   sodium chloride  flush  10-40 mL Intracatheter Q12H   torsemide   100 mg Oral Daily   Continuous Infusions:  feeding supplement (VITAL 1.5 CAL) 1,000 mL (04/18/24 0236)   promethazine  (PHENERGAN ) injection (IM or IVPB) 12.5 mg (04/05/24 1153)     LOS: 88 days    Time spent:    Sigurd Pac, MD [facility]   04/18/2024, 10:33 AM    "

## 2024-04-18 NOTE — Progress Notes (Signed)
 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  He has completed rounds of antibiotics.  Mild interstitial fibrosis/tubular atrophy.  Given concern for infection we provided steroids but avoided other IS medications due to weighing risks/benefits on pred taper now with plans to wean quickly given the main concern is infection as a cause he was on minoxidil  which caries similar risk for AI kidney phenomena as hydralazine . Stopped this (last dose 12/22)  Oliguric but no significant evidence of GFR recovery, continue dialysis -did receive contrast on 12/21 which may hinder Crt improvement Continue HD on MWF schedule:  Petechial rash;             - 2/2 Iga. Mgmt as above   Acute on chronic CHF exacerbation;             - HD and diuretics as aboven daily torsemide    H/o DVT and PE:             - Anticoagulation per cardiology and primary service   Boorehave's syndrome             - s/p repair             - Has a PEG  - EGD 03/19/24 with ulcers  - PPI and pepcid    HTN  - Carvedilol , followed BPs with UF  Dispo: Likely SNF, To go to [facility] at  time of discharge   Subjective:   No major interval events, no complaints this morning Clip completed to [facility]. HD yesterday with 2 L UF, 0.4 L UOP yesterday Creatinine was 2.6 and now up to 3.2, K4.3, BUN 85. Alkaline phosphatase further elevated, AST and ALT elevated as well. [name] working up Amiodarone  held Palliative consulted, agree   Objective:   BP (!) 140/72   Pulse 79   Temp 98 F (36.7 C) (Oral)   Resp (!) 25   Ht 6' 1 (1.854 m)   Wt 78.1 kg   SpO2 90%    BMI 22.72 kg/m   Intake/Output Summary (Last 24 hours) at 04/18/2024 1108 Last data filed at 04/18/2024 0830 Gross per 24 hour  Intake 70 ml  Output 2400 ml  Net -2330 ml    Weight change: 3 kg  Physical Exam: GEN: Lying in bed, mild increased work of breathing/respiratory rate PULM: As above, diminished in bases CV: Normal rate, regular rhythm ABD: + PEG, mild distention EXT: 1+  edema in the lower extremities, 1+ in the bilateral upper extremities NEURO: awake, alert, interactive Dialysis access: RIJ HD catheter  Imaging: US  Abdomen Limited RUQ (LIVER/GB) Result Date: 04/17/2024 CLINICAL DATA:  Elevated LFTs. EXAM: ULTRASOUND ABDOMEN LIMITED RIGHT UPPER QUADRANT COMPARISON:  CT 04/07/2024 FINDINGS: Gallbladder: Physiologically distended. No definite gallstones seen on the current exam. Echogenicity in the gallbladder neck which is difficult to accurately assess on the current exam, suspect sludge. Mild gallbladder wall thickening at 3.8 mm. No sonographic Murphy sign noted by sonographer. Common bile duct: Diameter: 4-5 mm, normal. Liver: No focal lesion identified. Mildly heterogeneous in parenchymal echogenicity. Portal vein is patent on color Doppler imaging with normal direction of blood flow towards the liver. Other: Trace right upper  quadrant ascites. Right pleural effusion noted. Patient had difficulty with positioning. IMPRESSION: 1. Nonspecific gallbladder wall thickening in the setting of ascites. No specific findings of acute cholecystitis. 2. Suspect layering sludge in the gallbladder neck. 3. Trace right upper quadrant ascites. Electronically Signed   By: Andrea Gasman M.D.   On: 04/17/2024 10:00     Labs: BMET Recent Labs  Lab 04/11/24 1231 04/12/24 0247 04/13/24 0520 04/14/24 0500 04/14/24 1455 04/15/24 0628 04/15/24 1150 04/16/24 0603 04/17/24 0416 04/18/24 0423  NA 133* 134* 131* 135 132* 135 133* 135 133* 134*  K 5.6* 5.7* 4.8 4.1 4.7 4.6 4.8 4.2 4.3  4.5  CL 95* 95* 93* 96* 94* 94* 93* 96* 94* 95*  CO2 24 27 28 30 28 28 28 30 28  30  GLUCOSE 175* 152* 88 172* 205* 188* 256* 159* 134* 148*  BUN 105* 111* 80* 65* 77* 85* 91* 62* 85* 59*  CREATININE 4.39* 4.61* 3.37* 2.77* 3.13* 3.29* 3.49* 2.58* 3.20* 2.54*  CALCIUM  8.2* 8.1* 7.8* 7.5* 7.9* 7.8* 7.7* 7.8* 7.9* 7.9*  PHOS 3.3 3.7 4.2 3.8  --  3.3  --   --   --   --    CBC Recent Labs  Lab 04/14/24 1455 04/15/24 1150 04/16/24 0603 04/17/24 0416 04/18/24 0423  WBC 12.5* 14.8* 11.5* 15.2* 14.6*  NEUTROABS 11.4*  --   --   --   --   HGB 9.8* 7.7* 7.7* 7.7* 7.6*  HCT 29.9* 23.7* 23.3* 23.3* 23.2*  MCV 84.2 83.7 83.5 82.6 82.3  PLT 177 195 186 222 238    Medications:     atorvastatin   80 mg Per Tube Daily   carvedilol   3.125 mg Per Tube BID WC   Chlorhexidine  Gluconate Cloth  6 each Topical Daily   clonazePAM   0.5 mg Per Tube BID   enoxaparin  (LOVENOX ) injection  30 mg Subcutaneous Q24H   ezetimibe   10 mg Per Tube Daily   famotidine   20 mg Per Tube Daily   feeding supplement (PROSource TF20)  60 mL Per Tube Daily   fiber  1 packet Per Tube BID   [name] butt cream   Topical BID   guaiFENesin   10 mL Per Tube Q4H   insulin  aspart  0-9 Units Subcutaneous Q4H   lidocaine   1 patch Transdermal Q24H   multivitamin  1 tablet Per Tube QHS   pantoprazole  (PROTONIX ) IV  40 mg Intravenous Q12H   predniSONE   20 mg Oral Q breakfast   Followed by   NOREEN ON 04/21/2024] predniSONE   10 mg Oral Q breakfast   QUEtiapine   12.5 mg Per Tube QHS   saccharomyces boulardii  250 mg Per Tube BID   sodium chloride  flush  10-40 mL Intracatheter Q12H   torsemide   100 mg Oral Daily

## 2024-04-18 NOTE — Progress Notes (Signed)
 9149: Continuous tube feed stopped/held per MD order verbal with readback.

## 2024-04-18 NOTE — Progress Notes (Signed)
"  °  Progress Note  Patient Name: Billy [name] Date of Encounter: 04/18/2024 [facility] Cardiologist: Dorn Lesches, MD   Interval Summary   No acute overnight events. Patient reports feeling relatively well. No new or acute complaints.   Vital Signs Vitals:   04/18/24 0510 04/18/24 0735 04/18/24 0754 04/18/24 0830  BP:  (!) 140/72 (!) 140/72   Pulse:  84 79   Resp: 18 (!) 25    Temp:  98 F (36.7 C)    TempSrc:  Oral    SpO2:  96%  90%  Weight:      Height:        Intake/Output Summary (Last 24 hours) at 04/18/2024 0924 Last data filed at 04/18/2024 0830 Gross per 24 hour  Intake 70 ml  Output 2400 ml  Net -2330 ml      04/18/2024    4:53 AM 04/17/2024    3:57 PM 04/17/2024    3:55 PM  Last 3 Weights  Weight (lbs) 172 lb 2.9 oz 169 lb 5 oz 169 lb 5 oz  Weight (kg) 78.1 kg 76.8 kg 76.8 kg      Telemetry/ECG  AF with controlled rates, NSVT - Personally Reviewed  Physical Exam  General: Chronically ill appearing, in no acute distress.  Neck: No JVD.  Cardiac: Normal rate, irregular rhythm.  Resp: Mildly increased work of breathing.  Ext: No edema.  Neuro: No gross focal deficits.  Psych: Normal affect.   Assessment & Plan  Billy [name] is a 74 y.o. 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   Slow MMVT AF/rate controlled   Plan: Amiodarone  discontinued due to transaminitis, LFTs still rising. Discontinued as well as mexiletine with predominant hepatic metabolism. No recurrent sustained arrhythmias over the prior 48 hours.  He remains on a small dose of Coreg  with controlled rates.  If he has recurrent  arrhythmias would switch Coreg  to Toprol -XL and uptitrate the dose.  For now we will monitor without addition of any other antiarrhythmic medications as his hepatic function hopefully recovers.  Unclear if amiodarone  was the culprit for his elevated LFTs or something else is causing this. If LFTs normalize would at least resume mexiletine.   Signed, Fonda Kitty, MD   "

## 2024-04-19 ENCOUNTER — Inpatient Hospital Stay (HOSPITAL_COMMUNITY)

## 2024-04-19 DIAGNOSIS — E43 Unspecified severe protein-calorie malnutrition: Secondary | ICD-10-CM | POA: Diagnosis not present

## 2024-04-19 DIAGNOSIS — Z7189 Other specified counseling: Secondary | ICD-10-CM | POA: Diagnosis not present

## 2024-04-19 DIAGNOSIS — I4891 Unspecified atrial fibrillation: Secondary | ICD-10-CM | POA: Diagnosis not present

## 2024-04-19 DIAGNOSIS — Z515 Encounter for palliative care: Secondary | ICD-10-CM | POA: Diagnosis not present

## 2024-04-19 DIAGNOSIS — K223 Perforation of esophagus: Secondary | ICD-10-CM | POA: Diagnosis not present

## 2024-04-19 DIAGNOSIS — I5023 Acute on chronic systolic (congestive) heart failure: Secondary | ICD-10-CM | POA: Diagnosis not present

## 2024-04-19 DIAGNOSIS — I472 Ventricular tachycardia, unspecified: Secondary | ICD-10-CM | POA: Diagnosis not present

## 2024-04-19 DIAGNOSIS — I4819 Other persistent atrial fibrillation: Secondary | ICD-10-CM | POA: Diagnosis not present

## 2024-04-19 LAB — COMPREHENSIVE METABOLIC PANEL WITH GFR
ALT: 380 U/L — ABNORMAL HIGH (ref 0–44)
AST: 193 U/L — ABNORMAL HIGH (ref 15–41)
Albumin: 2.1 g/dL — ABNORMAL LOW (ref 3.5–5.0)
Alkaline Phosphatase: 1547 U/L — ABNORMAL HIGH (ref 38–126)
Anion gap: 10 (ref 5–15)
BUN: 77 mg/dL — ABNORMAL HIGH (ref 8–23)
CO2: 29 mmol/L (ref 22–32)
Calcium: 8.3 mg/dL — ABNORMAL LOW (ref 8.9–10.3)
Chloride: 95 mmol/L — ABNORMAL LOW (ref 98–111)
Creatinine, Ser: 3.23 mg/dL — ABNORMAL HIGH (ref 0.61–1.24)
GFR, Estimated: 20 mL/min — ABNORMAL LOW
Glucose, Bld: 87 mg/dL (ref 70–99)
Potassium: 4.7 mmol/L (ref 3.5–5.1)
Sodium: 134 mmol/L — ABNORMAL LOW (ref 135–145)
Total Bilirubin: 1.8 mg/dL — ABNORMAL HIGH (ref 0.0–1.2)
Total Protein: 5.1 g/dL — ABNORMAL LOW (ref 6.5–8.1)

## 2024-04-19 LAB — CBC
HCT: 26.8 % — ABNORMAL LOW (ref 39.0–52.0)
Hemoglobin: 8.7 g/dL — ABNORMAL LOW (ref 13.0–17.0)
MCH: 26.9 pg (ref 26.0–34.0)
MCHC: 32.5 g/dL (ref 30.0–36.0)
MCV: 82.7 fL (ref 80.0–100.0)
Platelets: 258 K/uL (ref 150–400)
RBC: 3.24 MIL/uL — ABNORMAL LOW (ref 4.22–5.81)
RDW: 17.9 % — ABNORMAL HIGH (ref 11.5–15.5)
WBC: 15.3 K/uL — ABNORMAL HIGH (ref 4.0–10.5)
nRBC: 0 % (ref 0.0–0.2)

## 2024-04-19 LAB — APTT
aPTT: 34 s (ref 24–36)
aPTT: 80 s — ABNORMAL HIGH (ref 24–36)

## 2024-04-19 LAB — GLUCOSE, CAPILLARY
Glucose-Capillary: 126 mg/dL — ABNORMAL HIGH (ref 70–99)
Glucose-Capillary: 77 mg/dL (ref 70–99)
Glucose-Capillary: 85 mg/dL (ref 70–99)
Glucose-Capillary: 85 mg/dL (ref 70–99)
Glucose-Capillary: 90 mg/dL (ref 70–99)
Glucose-Capillary: 95 mg/dL (ref 70–99)

## 2024-04-19 LAB — HEPARIN LEVEL (UNFRACTIONATED): Heparin Unfractionated: 0.31 [IU]/mL (ref 0.30–0.70)

## 2024-04-19 MED ORDER — NEPRO/CARBSTEADY PO LIQD
237.0000 mL | Freq: Two times a day (BID) | ORAL | Status: DC
  Administered 2024-04-19 – 2024-04-22 (×6): 237 mL via ORAL
  Filled 2024-04-19: qty 237

## 2024-04-19 MED ORDER — RENA-VITE PO TABS
1.0000 | ORAL_TABLET | Freq: Every day | ORAL | Status: DC
  Administered 2024-04-19 – 2024-04-25 (×7): 1 via ORAL
  Filled 2024-04-19 (×8): qty 1

## 2024-04-19 MED ORDER — HEPARIN (PORCINE) 25000 UT/250ML-% IV SOLN
1500.0000 [IU]/h | INTRAVENOUS | Status: DC
  Administered 2024-04-19 – 2024-04-20 (×3): 1500 [IU]/h via INTRAVENOUS
  Administered 2024-04-21: 1350 [IU]/h via INTRAVENOUS
  Filled 2024-04-19 (×4): qty 250

## 2024-04-19 MED ORDER — HYDROMORPHONE HCL 1 MG/ML IJ SOLN
0.5000 mg | INTRAMUSCULAR | Status: DC | PRN
  Administered 2024-04-19 – 2024-04-28 (×44): 0.5 mg via INTRAVENOUS
  Filled 2024-04-19: qty 1
  Filled 2024-04-19 (×2): qty 0.5
  Filled 2024-04-19 (×2): qty 1
  Filled 2024-04-19: qty 0.5
  Filled 2024-04-19 (×3): qty 1
  Filled 2024-04-19 (×2): qty 0.5
  Filled 2024-04-19: qty 1
  Filled 2024-04-19 (×4): qty 0.5
  Filled 2024-04-19: qty 1
  Filled 2024-04-19 (×2): qty 0.5
  Filled 2024-04-19 (×2): qty 1
  Filled 2024-04-19: qty 0.5
  Filled 2024-04-19 (×2): qty 1
  Filled 2024-04-19: qty 0.5
  Filled 2024-04-19: qty 1
  Filled 2024-04-19 (×4): qty 0.5
  Filled 2024-04-19 (×3): qty 1
  Filled 2024-04-19: qty 0.5
  Filled 2024-04-19: qty 1
  Filled 2024-04-19: qty 0.5
  Filled 2024-04-19: qty 1
  Filled 2024-04-19 (×3): qty 0.5
  Filled 2024-04-19: qty 1
  Filled 2024-04-19: qty 0.5
  Filled 2024-04-19: qty 1
  Filled 2024-04-19: qty 0.5

## 2024-04-19 MED ORDER — OXYCODONE HCL 5 MG PO TABS
5.0000 mg | ORAL_TABLET | ORAL | Status: DC | PRN
  Administered 2024-04-20 – 2024-04-28 (×14): 5 mg via ORAL
  Filled 2024-04-19 (×15): qty 1

## 2024-04-19 MED ORDER — TECHNETIUM TC 99M MEBROFENIN IV KIT
5.0000 | PACK | Freq: Once | INTRAVENOUS | Status: AC | PRN
  Administered 2024-04-19: 5.2 via INTRAVENOUS

## 2024-04-19 NOTE — TOC Progression Note (Signed)
 Transition of Care [facility]) - Progression Note    Patient Details  Name: Billy [name] MRN: [id number] Date of Birth: 03-May-1950  [facility] CM/SW Contact  [name], [place] Phone Number: 04/19/2024, 10:48 AM  Clinical Narrative:   Per daily meeting with treatment team, patient on 2L O2. Palliative consulted. CSW will continue to follow up for discharge plan.    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, SNF Pending bed offer               Expected Discharge Plan and Services In-house Referral: Clinical Social Work Discharge Planning Services: [employer] Consult Post Acute Care Choice: Skilled Nursing Facility Living arrangements for the past 2 months: Single Family Home                                       Social Drivers of Health (SDOH) Interventions SDOH Screenings   Food Insecurity: No Food Insecurity (01/22/2024)  Housing: High Risk (01/22/2024)  Transportation Needs: No Transportation Needs (01/22/2024)  Utilities: Not At Risk (01/22/2024)  Social Connections: Socially Isolated (01/22/2024)  Tobacco Use: Low Risk (03/19/2024)    Readmission Risk Interventions    06/27/2023    1:52 PM 10/14/2022   11:29 AM  Readmission Risk Prevention Plan  Post Dischage Appt  Complete  Medication Screening  Complete  Transportation Screening Complete Complete  HRI or Home Care Consult Complete   Social Work Consult for Recovery Care Planning/Counseling Complete   Palliative Care Screening Not Applicable   Medication Review Oceanographer) Referral to Pharmacy

## 2024-04-19 NOTE — Consult Note (Signed)
 "                              Consultation Note Date: 04/19/2024   Patient Name: Billy [name]  DOB: 31-Mar-1951  MRN: [id number]  Age / Sex: 74 y.o., male  PCP: Seabron Lenis, MD Referring Physician: Fairy Frames, MD  Reason for Consultation: Establishing goals of care  HPI/Patient Profile: 74 y.o. 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  Patient had stomach virus with nausea and vomiting for days up until this event.  Hospital events as follows: 10/7 esophageal stent placed by cardiothoracic 10/15 stent reposition 10/16 extubated 10/20 reintubated with ultrasound showing left-sided pleural effusion-left-sided chest tube placed-by lights criteria exudative effusion 10/23 echo 60-65% 10/24 barium swallow showed persistent leak 10/28 transferred out of ICU 11/3 PEG tube placed 11/19 swelling of right arm showing on ultrasound SVT cephalic vein 11/22 chest x-ray?  Pneumonia done because of back pain-follow-up CT scan shows stent in place no mediastinal fluid collection small bilateral pleural effusions with bibasilar atelectasis pneumonia not excluded-scattered clusters nodular densities predominantly right upper and right lower lobes with nodule 11 mm as previously seen?  Mets?  Infection?  Aspiration 11/26 stent removed. Esophagram negative for esophageal leak.  11/27 getting diuresis again 11/28 duodenitis/enteritis.  GI consulted. Bloody emesis added carafate   11/29 trialing clears 12/1 NV again w/ hematemesis. Lasix  held ---  esophagram performed showing patent esophagus without perforation irregularity mid esophagus?  Stent diffuse narrowing lumen distal duodenum and proximal jejunum 12/2 EGD ischemic  ulcers. Impression:  - Congested, inflamed, nodular, texture changed  mucosa in the esophagus. - Gastritis, Non-bleeding duodenal ulcers with a clean ulcer  base (Forrest Class III).- Non-bleeding jejunal ulcers with a clean ulcer   base (Forrest Class III)  - Findings suggestive of ischemic ulcers. Increased carafate  and cont BID PPI  12/3 full liq diet  12/4 marked increased WOB. Acute and progressive w/ increased hypoxia and HTN. 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 12/30, started on mexiletine 12/31, LFTs trending up, amiodarone  and mexiletine discontinued, FU RUQ US   PMT has been consulted day 88 to assist with goals of care conversation in light of poor long-term prognosis, this week's respiratory distress, sustained VT on 12/29, and A-fib with RVR.  PMT is familiar with patient from prolonged hospitalization 3/10-4/3.  Clinical Assessment and Goals  of Care:  I have reviewed medical  records including EPIC notes, labs and imaging, discussed with RN, assessed the patient and then met at the bedside to discuss diagnosis prognosis, GOC, EOL wishes, disposition and options.  I introduced Palliative Medicine as specialized medical care for people living with serious illness. It focuses on providing relief from the symptoms and stress of a serious illness. The goal is to improve quality of life for both the patient and the family.  We discussed a brief life review of the patient and then focused on their current illness.   I attempted to elicit values and goals of care important to the patient.    Medical History Review and Understanding:  Detailed review of patient's acute illness in the context of their chronic comorbidities.  Patient understand the severity of his illness and the challenges he will faced during his recovery.  Social History: Patient confirms that he has 1 son who lives in [place] and 1 daughter who lives in [place] .  He lives at home alone.  He shares that he is a Christian and was looking for a church before this hospital stay.  He greatly values teamwork and shares how important this was with his former work.  Functional and Nutritional State: Patient reports he was ambulating without assistive devices at home, though much slower since his April hospitalization.  He had a good appetite as well.  Palliative Symptoms: Pain, worse since HIDA scan  Advance Directives: Reviewed Vynca - AD from 04/03/16 indicates that [employer], his two children, are joint HCPOAs. He desires cremation, grants HCPOA's decision-making without specifying his wishes on the advance directive.  Patient has DNR form scanned in from 07/18/23.   Code Status: Concepts specific to code status, artifical feeding and hydration, and rehospitalization were considered and discussed.  We discussed patient's thought process as he reversed DNR from earlier last year to now full code.   He acknowledges that he may decide to change his may mind again.  Discussion: Patient recalls he has previous discussions with PMT and he is open to goals of care discussions today.  He tells me that he is not ready to give up living and that his goal is to return to living at home independently, even if he is weaker and slower.  He wants to continue his previous efforts to find a church community and reflects on his spiritual needs and faith throughout this hospitalization.  Emotional support and therapeutic listening was provided.  He is praying for his kidney to wake back up.  I participated in prayer at the bedside at his request.  He will consider whether he would like a chaplain consult. Patient feels that he has been well-informed of his care plan and treatment options throughout this hospital stay.  He speaks highly of the care he has received.  He understands that he has a lot going against him but he is relying on prayer to get through each day.   The difference between aggressive medical intervention and comfort care was considered in light of the patient's goals of care. Hospice and Palliative Care services outpatient were explained and offered.   Discussed the importance of continued conversation with family and the medical providers regarding overall plan of care and treatment options, ensuring decisions are within the context of the patients values and GOCs.   Questions and concerns were addressed. The family was encouraged to call with questions or concerns.  PMT will continue to support holistically.  SUMMARY OF RECOMMENDATIONS   - Continue full code/full scope treatment - Patient understands overall poor prognosis and desires to continue treatment efforts for the time being - Psychosocial and emotional support provided - Ongoing goals of care discussions - PMT will continue to follow and support  Prognosis:  Guarded  Discharge Planning: Skilled Nursing Facility for  rehab with Palliative care service follow-up      Primary Diagnoses: Present on Admission:  Esophageal rupture  Pleural effusion   Physical Exam Vitals and nursing note reviewed.  Constitutional:      General: He is not in acute distress.    Appearance: He is ill-appearing.  HENT:     Head: Normocephalic and atraumatic.  Cardiovascular:     Rate and Rhythm: Normal rate.  Pulmonary:     Effort: Pulmonary effort is normal. No respiratory distress.  Neurological:     Mental Status: He is alert and oriented to person, place, and time.  Psychiatric:        Mood and Affect: Affect is tearful.        Behavior: Behavior normal.    Vital Signs: BP (!) 127/58 (BP Location: Left Arm)   Pulse 74   Temp 98.5 F (36.9 C) (Oral)   Resp 19   Ht 6' 1 (1.854 m)   Wt 77.1 kg   SpO2 93%   BMI 22.43 kg/m  Pain Scale: 0-10 POSS *See Group Information*: S-Acceptable,Sleep, easy to arouse Pain Score: Asleep   SpO2: SpO2: 93 % O2 Device:SpO2: 93 % O2 Flow Rate: .O2 Flow Rate (L/min): 2 L/min   Palliative Assessment/Data: 40%    Danyal Adorno SHAUNNA Fell, PA-C  Palliative Medicine Team Team phone # [phone]  Thank you for allowing the Palliative Medicine Team to assist in the care of this patient. Please utilize secure chat with additional questions, if there is no response within 30 minutes please call the above phone number.  Palliative Medicine Team providers are available by phone from 7am to 7pm daily and can be reached through the team cell phone.  Should this patient require assistance outside of these hours, please call the patient's attending physician.    Time Total: 75  Visit consisted of counseling and education dealing with the complex and emotionally intense issues of symptom management and palliative care in the setting of serious and potentially life-threatening illness. Greater than 50% of this time was spent counseling and coordinating care related to the above  assessment and plan.  Personally spent 75 minutes in patient care including extensive chart review (labs, imaging, progress/consult notes, vital signs), medically appropraite exam, discussed with treatment team, education to patient, family, and staff, documenting clinical information, medication review and management, coordination of care, and available advanced directive documents.    "

## 2024-04-19 NOTE — Progress Notes (Signed)
 PHARMACY - ANTICOAGULATION CONSULT NOTE  Pharmacy Consult for Heparin  Indication: atrial fibrillation and DVT  Allergies[1]  Patient Measurements: Height: 6' 1 (185.4 cm) Weight: 75.1 kg (165 lb 9.1 oz) IBW/kg (Calculated) : 79.9 HEPARIN  DW (KG): 75.9  Vital Signs: Temp: 97.8 F (36.6 C) (01/02 1904) Temp Source: Oral (01/02 1150) BP: 134/67 (01/02 1904) Pulse Rate: 99 (01/02 1904)  Labs: Recent Labs    04/17/24 0416 04/17/24 0529 04/18/24 0423 04/19/24 0500 04/19/24 1425 04/19/24 2009  HGB 7.7*  --  7.6* 8.7*  --   --   HCT 23.3*  --  23.2* 26.8*  --   --   PLT 222  --  238 258  --   --   APTT  --   --   --   --  34 80*  HEPARINUNFRC  --  <0.10*  --   --   --  0.31  CREATININE 3.20*  --  2.54* 3.23*  --   --     Estimated Creatinine Clearance: 22.3 mL/min (A) (by C-G formula based on SCr of 3.23 mg/dL (H)).  Assessment: Antoino Westhoff is a 74 y.o. year old male admitted on 01/21/2024 with concern for esophageal rupture. On eliquis  prior to admission for afib and hx DVT (last dose pta 12/9 PM). Pharmacy consulted to dose heparin  for afib with no bolus.    Heparin  drip has been on hold with drop in HGB as work up for abdominal pain - HIDA negative today no procedures planned   Discussed with [name] - plan to resume heparin  drip - no bolus titrate slowly to low goal 0.3-0.5   -heparin  level= 0.31 and aPTT= 80  Goal of Therapy:  Heparin  level 0.3-0.5 units/ml Monitor platelets by anticoagulation protocol: Yes   Plan:  Continue Heparin  drip at 1500 uts/hr  Daily heparin  level,and CBC   Prentice Poisson, PharmD Clinical Pharmacist **Pharmacist phone directory can now be found on [email or web address] ([name]).  Listed under [facility].           [1]  Allergies Allergen Reactions   Hydralazine  Hcl     Concern for drug induced lupus   Norvasc  [Amlodipine ] Swelling and Other (See Comments)    Excessive BLE swelling Skin discoloration, blotching   Jardiance   [Empagliflozin ] Itching and Other (See Comments)    Patient mentioned penile swelling, pain   Morphine  And Codeine Itching    Opioid-induced pruritus

## 2024-04-19 NOTE — Progress Notes (Signed)
" °   04/19/24 1904  Vitals  Temp 97.8 F (36.6 C)  Pulse Rate 99  Resp 20  BP 134/67  SpO2 100 %  O2 Device Nasal Cannula  Weight 75.1 kg  Type of Weight Post-Dialysis  Oxygen  Therapy  O2 Flow Rate (L/min) 3 L/min  Patient Activity (if Appropriate) In bed  Pulse Oximetry Type Continuous  Oximetry Probe Site Changed No  Post Treatment  Dialyzer Clearance Clear  Hemodialysis Intake (mL) 0 mL  Liters Processed 82  Fluid Removed (mL) 2000 mL  Tolerated HD Treatment Yes   Received patient in bed to unit.  Alert and oriented.  Informed consent signed and in chart.   TX duration:3.5  Patient tolerated well.  Transported back to the room  Alert, without acute distress.  Hand-off given to patient's nurse.   Access used: [facility] Access issues: no complications  Total UF removed: 2000 Medication(s) given: none   [name] "

## 2024-04-19 NOTE — Progress Notes (Signed)
 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  He has completed rounds of antibiotics.  Mild interstitial fibrosis/tubular atrophy.  Given concern for infection we provided steroids but avoided other IS medications due to weighing risks/benefits on pred taper now with plans to wean quickly given the main concern is infection as a cause he was on minoxidil  which caries similar risk for AI kidney phenomena as hydralazine . Stopped this (last dose 12/22)  Oliguric but no significant evidence of GFR recovery, continue dialysis -did receive contrast on 12/21 which may hinder Crt improvement Continue HD on MWF schedule: HD today  Petechial rash;             - 2/2 Iga. S/p steroids, improved   Acute on chronic CHF exacerbation;             - HD and diuretics as aboven daily torsemide    H/o DVT and PE:             - Anticoagulation per cardiology and primary service   Boorehave's syndrome             - s/p repair             - Has a PEG  - EGD 03/19/24 with ulcers  - PPI and pepcid    HTN  - Carvedilol , followed BPs with UF  Dispo: Likely SNF, To [facility] at  time of discharge   Subjective:   No major interval events, no complaints this morning Clip completed to [facility]. For HD today  UOP yesterday but Cr 2.5 > 3.2 Palliative consult pending    Objective:   BP (!) 127/58 (BP Location: Left Arm)   Pulse 74   Temp 98.5 F (36.9 C) (Oral)   Resp 19   Ht 6' 1 (1.854 m)   Wt 77.1 kg   SpO2 93%   BMI 22.43 kg/m   Intake/Output Summary (Last 24 hours) at 04/19/2024 0847 Last data filed at 04/19/2024  0539 Gross per 24 hour  Intake 130 ml  Output 800 ml  Net -670 ml    Weight change: -1.8 kg  Physical Exam: GEN: Lying in bed, calm PULM: normal WOB, clear ant CV: Normal rate, regular rhythm ABD: + PEG, mild distention EXT: trace edema NEURO: awake, alert, interactive Dialysis access: RIJ HD catheter  Imaging: No results found.    Labs: BMET Recent Labs  Lab 04/13/24 0520 04/14/24 0500 04/14/24 1455 04/15/24 0628 04/15/24 1150 04/16/24 0603 04/17/24 0416 04/18/24 0423 04/19/24 0500  NA 131* 135 132* 135 133* 135 133* 134* 134*  K 4.8 4.1 4.7 4.6 4.8 4.2 4.3 4.5 4.7  CL 93* 96* 94* 94* 93* 96* 94* 95* 95*  CO2 28 30 28 28 28 30 28 30 29   GLUCOSE 88 172* 205* 188* 256* 159* 134* 148* 87  BUN 80* 65* 77* 85* 91* 62* 85* 59* 77*  CREATININE 3.37* 2.77* 3.13* 3.29* 3.49* 2.58* 3.20* 2.54* 3.23*  CALCIUM  7.8* 7.5* 7.9* 7.8* 7.7* 7.8* 7.9* 7.9* 8.3*  PHOS 4.2 3.8  --  3.3  --   --   --   --   --  CBC Recent Labs  Lab 04/14/24 1455 04/15/24 1150 04/16/24 0603 04/17/24 0416 04/18/24 0423 04/19/24 0500  WBC 12.5*   < > 11.5* 15.2* 14.6* 15.3*  NEUTROABS 11.4*  --   --   --   --   --   HGB 9.8*   < > 7.7* 7.7* 7.6* 8.7*  HCT 29.9*   < > 23.3* 23.3* 23.2* 26.8*  MCV 84.2   < > 83.5 82.6 82.3 82.7  PLT 177   < > 186 222 238 258   < > = values in this interval not displayed.    Medications:     atorvastatin   80 mg Per Tube Daily   carvedilol   3.125 mg Per Tube BID WC   Chlorhexidine  Gluconate Cloth  6 each Topical Daily   clonazePAM   0.5 mg Per Tube BID   enoxaparin  (LOVENOX ) injection  30 mg Subcutaneous Q24H   ezetimibe   10 mg Per Tube Daily   famotidine   20 mg Per Tube Daily   feeding supplement (PROSource TF20)  60 mL Per Tube Daily   fiber  1 packet Per Tube BID   [name] butt cream   Topical BID   guaiFENesin   10 mL Per Tube Q4H   insulin  aspart  0-9 Units Subcutaneous Q4H   lidocaine   1 patch Transdermal Q24H   multivitamin  1 tablet Per  Tube QHS   pantoprazole  (PROTONIX ) IV  40 mg Intravenous Q12H   predniSONE   20 mg Oral Q breakfast   Followed by   NOREEN ON 04/21/2024] predniSONE   10 mg Oral Q breakfast   QUEtiapine   12.5 mg Per Tube QHS   saccharomyces boulardii  250 mg Per Tube BID   sodium chloride  flush  10-40 mL Intracatheter Q12H   torsemide   100 mg Oral Daily

## 2024-04-19 NOTE — Progress Notes (Signed)
 Physical Therapy Treatment Patient Details Name: Billy [name] MRN: [id number] DOB: March 03, 1951 Today's Date: 04/19/2024   History of Present Illness Billy [name] is a 74 y.o. male who presented to [facility] ED 01/21/24 with several days of vomiting followed by an episode of severe tearing epigastric pain. Work-up revealed esophageal tear. Pt s/p esophageal stent with EGD 10/7. Attempted stent repositioning on 10/15 and was hypoxic, was intubated.  Reintubated 10/20-10/24. G tube placed 11/3. He accidentally pulled out the R chest tube on 11/7.L chest tube removed 11/11. Found to have superficial venous thrombosis of RUE 11/19. N&V with epigastric pain 11/28, found severe duodenitis on repeat CT. S/P EGD on 12/2. 12/10 re-intubated due to respiratory distress. CRRT 12/10-12/16. Extubated 12/12. 12/26 severe respiratory distress needing nonrebreather, volume overloaded, required emergent HD. Pending US  abdomen. PMHx: HFrEF, AFib, CAD, PE complicated by cardiogenic shock, CKD 4, MI, peripheral neuropathy, and TIA.    PT Comments  Pt tolerated session well, completing bed mobility with moderate physical assistance of 2. Pt able to sit EOB with physical assistance for improved stability, but unable to progress due to transport coming to take pt to radiology. Pt perseverating on schedule of the day (dialysis, and scan) requiring redirection throughout session. Pt would benefit from further bed mobility and transfer training. PT will continue to treat pt while he is admitted.    If plan is discharge home, recommend the following: Two people to help with walking and/or transfers;Two people to help with bathing/dressing/bathroom;Assistance with cooking/housework;Assistance with feeding;Direct supervision/assist for medications management;Direct supervision/assist for financial management;Assist for transportation;Help with stairs or ramp for entrance   Can travel by private vehicle        Equipment Recommendations  Rolling  walker (2 wheels);BSC/3in1;Wheelchair (measurements PT);Wheelchair cushion (measurements PT);Hospital bed    Recommendations for Other Services Rehab consult     Precautions / Restrictions Precautions Precautions: Fall Recall of Precautions/Restrictions: Intact Precaution/Restrictions Comments: PEG Restrictions Weight Bearing Restrictions Per Provider Order: No     Mobility  Bed Mobility Overal bed mobility: Needs Assistance Bed Mobility: Rolling, Sidelying to Sit, Sit to Sidelying Rolling: Min assist, Used rails Sidelying to sit: Mod assist, +2 for physical assistance     Sit to sidelying: Mod assist, +2 for physical assistance General bed mobility comments: Pt performed bed mobility to R side of bed. Pt able to advance BLEs towards EOB but requires physical assistance for trunk and hip management via bed pad. Step by step cues for sequencing.    Transfers                        Ambulation/Gait                   Stairs             Wheelchair Mobility     Tilt Bed    Modified Rankin (Stroke Patients Only)       Balance Overall balance assessment: Needs assistance Sitting-balance support: Bilateral upper extremity supported, Feet supported Sitting balance-Leahy Scale: Poor Sitting balance - Comments: Pt sat EOB w/ BUE supported on bed and therapist supporting from behind. Unable to sustain position secondary to transport requesting to take pt to radioloy. Postural control: Posterior lean                                  Communication Communication Communication: Impaired Factors Affecting Communication: Hearing impaired  Cognition Arousal: Alert Behavior During Therapy: Anxious, Flat affect   PT - Cognitive impairments: Attention, Sequencing, Problem solving                         Following commands: Impaired Following commands impaired: Follows multi-step commands inconsistently    Cueing Cueing  Techniques: Verbal cues, Tactile cues, Gestural cues  Exercises      General Comments General comments (skin integrity, edema, etc.): 2L [place] SpO2 88%      Pertinent Vitals/Pain Pain Assessment Pain Assessment: Faces Faces Pain Scale: Hurts little more Pain Location: all over- generalized Pain Descriptors / Indicators: Discomfort, Aching, Sore Pain Intervention(s): Limited activity within patient's tolerance, Monitored during session    Home Living                          Prior Function            PT Goals (current goals can now be found in the care plan section) Acute Rehab PT Goals Patient Stated Goal: Feel better Progress towards PT goals: Progressing toward goals    Frequency    Min 2X/week      PT Plan      Co-evaluation   Reason for Co-Treatment: Complexity of the patient's impairments (multi-system involvement);For patient/therapist safety;To address functional/ADL transfers;Necessary to address cognition/behavior during functional activity PT goals addressed during session: Mobility/safety with mobility;Balance;Strengthening/ROM        AM-PAC PT 6 Clicks Mobility   Outcome Measure  Help needed turning from your back to your side while in a flat bed without using bedrails?: A Little Help needed moving from lying on your back to sitting on the side of a flat bed without using bedrails?: A Lot Help needed moving to and from a bed to a chair (including a wheelchair)?: A Lot Help needed standing up from a chair using your arms (e.g., wheelchair or bedside chair)?: A Lot Help needed to walk in hospital room?: Total Help needed climbing 3-5 steps with a railing? : Total 6 Click Score: 11    End of Session   Activity Tolerance: Patient tolerated treatment well Patient left: in bed;with call bell/phone within reach;Other (comment) (transport present) Nurse Communication: Mobility status PT Visit Diagnosis: Other abnormalities of gait and mobility  (R26.89);Muscle weakness (generalized) (M62.81);Other symptoms and signs involving the nervous system (R29.898);History of falling (Z91.81)     Time: 0840-0900 PT Time Calculation (min) (ACUTE ONLY): 20 min  Charges:    $Therapeutic Activity: 8-22 mins PT General Charges $$ ACUTE PT VISIT: 1 Visit                     Leontine Hilt DPT Acute Rehab Services [phone] Prefer contact via chat    Leontine NOVAK Torsha Lemus 04/19/2024, 9:48 AM

## 2024-04-19 NOTE — Progress Notes (Signed)
 PHARMACY - ANTICOAGULATION CONSULT NOTE  Pharmacy Consult for Heparin  Indication: atrial fibrillation and DVT  Allergies[1]  Patient Measurements: Height: 6' 1 (185.4 cm) Weight: 77.1 kg (169 lb 15.6 oz) IBW/kg (Calculated) : 79.9 HEPARIN  DW (KG): 75.9  Vital Signs: Temp: 97.8 F (36.6 C) (01/02 1150) Temp Source: Oral (01/02 1150) BP: 137/69 (01/02 1150) Pulse Rate: 82 (01/02 1150)  Labs: Recent Labs    04/17/24 0416 04/17/24 0529 04/18/24 0423 04/19/24 0500  HGB 7.7*  --  7.6* 8.7*  HCT 23.3*  --  23.2* 26.8*  PLT 222  --  238 258  HEPARINUNFRC  --  <0.10*  --   --   CREATININE 3.20*  --  2.54* 3.23*    Estimated Creatinine Clearance: 22.9 mL/min (A) (by C-G formula based on SCr of 3.23 mg/dL (H)).  Assessment: Billy [name] is a 74 y.o. year old male admitted on 01/21/2024 with concern for esophageal rupture. On eliquis  prior to admission for afib and hx DVT (last dose pta 12/9 PM). Pharmacy consulted to dose heparin  for afib with no bolus.    Heparin  drip has been on hold with drop in HGB as work up for abdominal pain - HIDA negative today no procedures planned  HGB  low stable 7s - no bleeding noted Discussed with [name] - plan to resume heparin  drip - no bolus titrate slowly to low goal 0.3-0.5    Goal of Therapy:  Heparin  level 0.3-0.5 units/ml Monitor platelets by anticoagulation protocol: Yes   Plan:  Heparin  dip 1500 uts/hr  Check heparin  level and aptt in 6hr after start Daily heparin  level, aptt and CBC  Monitor s/s bleeding   Olam Chalk Pharm.D. CPP, BCPS Clinical Pharmacist [phone] 04/19/2024 1:28 PM            [1]  Allergies Allergen Reactions   Hydralazine  Hcl     Concern for drug induced lupus   Norvasc  [Amlodipine ] Swelling and Other (See Comments)    Excessive BLE swelling Skin discoloration, blotching   Jardiance  [Empagliflozin ] Itching and Other (See Comments)    Patient mentioned penile swelling, pain   Morphine  And  Codeine Itching    Opioid-induced pruritus

## 2024-04-19 NOTE — Progress Notes (Signed)
 Nutrition Follow-up  DOCUMENTATION CODES:  Severe malnutrition in context of acute illness/injury  INTERVENTION:  Recommend resume TF via PEG with nocturnal regimen: Vital 1.5 at 80 ml/h x 12 hours (6pm-6am) Provides 1440 kcal, 65 gm protein, 733 ml free water  daily (60% of estimated kcal and protein needs) Start Nepro Shake po BID, each supplement provides 425 kcal and 19 grams protein Change renal MVI route to PO Discontinue nutrisource fiber  NUTRITION DIAGNOSIS:  Severe Malnutrition related to acute illness (may have chronic component due to time frame of reported wt loss) as evidenced by severe fat depletion, moderate muscle depletion; ongoing.  GOAL:  Patient will meet greater than or equal to 90% of their needs; unmet.  MONITOR:  PO intake, Supplement acceptance, TF tolerance, Labs, I & O's  REASON FOR ASSESSMENT:  Consult Enteral/tube feeding initiation and management  ASSESSMENT:  74 yo male admitted post several days of N/V followed by sudden onset of severe epigastric pain radiating to chest and back. Pt found to have small esophageal perforation above GE junction. PMH includes CAD, chronic HFpEF, HTN, HLD, DVT, CVA, PAF, CKD 3b.  Patient was NPO and tube feeding was held for HIDA scan this morning. HIDA scan was negative for acute cholecystitis. Dysphagia 3 diet with thin liquids has been resumed. Patient has ongoing minimal intake of meals per discussion with RN. From 12/29-12/31, average meal intake was 16%.   Spoke with patient who reports very poor appetite d/t being sick for so long; current LOS is 89 days. He c/o the food being too dry, will add extra sauce/gravy with meals. He agreed to try Nepro supplement PO later today, not right now. He is hoping to go to HD this afternoon.   PEG in place. TF has been discontinued d/t concern for cholestasis. Discussed providing nocturnal tube feedings with MD, who may consider resuming TF if his numbers improve.  Previous TF  orders: Vital 1.5 at 65 ml/h, Prosource TF20 60 ml once daily.  Palliative care team has been consulted.  Last HD 12/31, 2 L removed. Next HD planned for today.  Labs reviewed.  Na 134 (L) K 4.7 (WNL) Phos 3.3 (WNL) CBG: [id number]  Medications reviewed and include pepcid , nutrisource fiber BID, novolog , rena-vite, protonix , prednisone , florastor.  Diet Order:   Diet Order             DIET DYS 3 Room service appropriate? Yes; Fluid consistency: Thin  Diet effective now                   EDUCATION NEEDS:  Education needs have been addressed  Skin:  Skin Assessment: Reviewed RN Assessment Skin Integrity Issues:: DTI DTI: L thigh, L knee, bilateral buttocks Stage I: n/a  Last BM:  1/1 type 6  Height:  Ht Readings from Last 1 Encounters:  04/17/24 6' 1 (1.854 m)   Weight:  Wt Readings from Last 1 Encounters:  04/19/24 77.1 kg   Ideal Body Weight:  83.6 kg  BMI:  Body mass index is 22.43 kg/m.  Estimated Nutritional Needs:  Kcal:  2300-2500 kcals Protein:  110-130g Fluid:  1L + UOP   Suzen HUNT RD, LDN, CNSC Contact via secure chat. If unavailable, use group chat RD Inpatient.

## 2024-04-19 NOTE — Progress Notes (Signed)
 Occupational Therapy Treatment Patient Details Name: Billy [name] MRN: [id number] DOB: 04/13/1951 Today's Date: 04/19/2024   History of present illness Billy [name] is a 74 y.o. male who presented to [facility] ED 01/21/24 with several days of vomiting followed by an episode of severe tearing epigastric pain. Work-up revealed esophageal tear. Pt s/p esophageal stent with EGD 10/7. Attempted stent repositioning on 10/15 and was hypoxic, was intubated.  Reintubated 10/20-10/24. G tube placed 11/3. He accidentally pulled out the R chest tube on 11/7.L chest tube removed 11/11. Found to have superficial venous thrombosis of RUE 11/19. N&V with epigastric pain 11/28, found severe duodenitis on repeat CT. S/P EGD on 12/2. 12/10 re-intubated due to respiratory distress. CRRT 12/10-12/16. Extubated 12/12. 12/26 severe respiratory distress needing nonrebreather, volume overloaded, required emergent HD. Pending US  abdomen. PMHx: HFrEF, AFib, CAD, PE complicated by cardiogenic shock, CKD 4, MI, peripheral neuropathy, and TIA.   OT comments  Pt seen for limited session today due to transport arriving.  Pt requires increased time for all tasks, cueing for sequencing and problem solving.  He requires mod assist +2 for bed mobility, dangling on EOB briefly before transport arriving.  From bed level needing min assist for UB ADLs and total assist for LB Adls.  Pt internally distracted by scan and HD planned today.  Will continue to progress towards goals.  OT will follow acutely, continue to recommend long term rehab at dc.        If plan is discharge home, recommend the following:  Two people to help with walking and/or transfers;Two people to help with bathing/dressing/bathroom;Assistance with cooking/housework;Assistance with feeding;Assist for transportation;Supervision due to cognitive status   Equipment Recommendations  Other (comment) (defer)    Recommendations for Other Services      Precautions / Restrictions  Precautions Precautions: Fall Recall of Precautions/Restrictions: Impaired Precaution/Restrictions Comments: PEG Restrictions Weight Bearing Restrictions Per Provider Order: No       Mobility Bed Mobility Overal bed mobility: Needs Assistance Bed Mobility: Rolling, Sidelying to Sit, Sit to Sidelying Rolling: Min assist, Used rails Sidelying to sit: Mod assist, +2 for physical assistance     Sit to sidelying: Mod assist, +2 for physical assistance General bed mobility comments: towards R side of bed with incrased time for sequencing and technique; more assist required for trunk support and pt able to manage LB    Transfers                         Balance Overall balance assessment: Needs assistance Sitting-balance support: Bilateral upper extremity supported, Feet supported Sitting balance-Leahy Scale: Fair                                     ADL either performed or assessed with clinical judgement   ADL Overall ADL's : Needs assistance/impaired Eating/Feeding: NPO   Grooming: Set up;Bed level           Upper Body Dressing : Minimal assistance;Sitting (EOB)   Lower Body Dressing: Total assistance;Sitting/lateral leans Lower Body Dressing Details (indicate cue type and reason): assist for socks             Functional mobility during ADLs: Moderate assistance;+2 for physical assistance General ADL Comments: to EOB only, limited by transport arriving for nuclear medicine    Extremity/Trunk Assessment              Vision  Additional Comments: NT   Perception     Praxis     Communication Communication Communication: Impaired Factors Affecting Communication: Hearing impaired   Cognition Arousal: Alert Behavior During Therapy: Anxious Cognition: Cognition impaired     Awareness: Online awareness impaired Memory impairment (select all impairments): Working memory Attention impairment (select first level of impairment):  Sustained attention Executive functioning impairment (select all impairments): Problem solving, Reasoning, Initiation, Sequencing, Organization OT - Cognition Comments: pt requires increased time to process and probelm solve, difficulty sequencing. increased distraction due to busy day today                 Following commands: Impaired Following commands impaired: Follows one step commands with increased time, Follows multi-step commands inconsistently      Cueing   Cueing Techniques: Verbal cues, Tactile cues  Exercises      Shoulder Instructions       General Comments VSS on 2L [place]    Pertinent Vitals/ Pain       Pain Assessment Pain Assessment: Faces Faces Pain Scale: Hurts little more Pain Location: all over- generalized Pain Descriptors / Indicators: Discomfort, Aching, Sore Pain Intervention(s): Limited activity within patient's tolerance, Monitored during session, Repositioned  Home Living                                          Prior Functioning/Environment              Frequency  Min 2X/week        Progress Toward Goals  OT Goals(current goals can now be found in the care plan section)  Progress towards OT goals: Progressing toward goals (slowly, has missed a lot of therapy due to HD and will continue to work towards goals)     Plan      Co-evaluation                 AM-PAC OT 6 Clicks Daily Activity     Outcome Measure   Help from another person eating meals?: Total Help from another person taking care of personal grooming?: A Little Help from another person toileting, which includes using toliet, bedpan, or urinal?: Total Help from another person bathing (including washing, rinsing, drying)?: A Lot Help from another person to put on and taking off regular upper body clothing?: A Little Help from another person to put on and taking off regular lower body clothing?: Total 6 Click Score: 11    End of Session  Equipment Utilized During Treatment: Oxygen   OT Visit Diagnosis: Muscle weakness (generalized) (M62.81);Other symptoms and signs involving cognitive function;Unsteadiness on feet (R26.81)   Activity Tolerance Patient limited by pain;Other (comment) (transport arriving)   Patient Left in bed;with call bell/phone within reach;Other (comment) (transport arrived)   Nurse Communication Mobility status        Time: [phone] OT Time Calculation (min): 10 min  Charges: OT General Charges $OT Visit: 1 Visit OT Treatments $Self Care/Home Management : 8-22 mins  Etta NOVAK, OT Acute Rehabilitation Services Office [phone] Secure Chat Preferred    Etta GORMAN [place] 04/19/2024, 9:25 AM

## 2024-04-19 NOTE — Progress Notes (Addendum)
 " PROGRESS NOTE    Billy [name]  FMW:[id number] DOB: [birth year] DOA: 01/21/2024 PCP: Seabron Lenis, MD  Chronically ill 73/M prolonged hospitalization from 3/10-4/3 with cardiac arrest, A-fib, DVTs, hematochezia, IVC filter, chest tubes, eventually stabilized, discharged to rehab and then went home, readmitted 10/5 with large esophageal tear 10/7 esophageal stent placed by cardiothoracic 10/15 stent reposition 10/16 extubated 10/20 reintubated with ultrasound showing left-sided pleural effusion-left-sided chest tube placed-by lights criteria exudative effusion 10/23 echo 60-65% 10/24 barium swallow showed persistent leak 10/28 transferred out of ICU 11/3 PEG tube placed 11/19 swelling of right arm showing on ultrasound SVT cephalic vein 11/22 chest x-ray?  Pneumonia done because of back pain-follow-up CT scan shows stent in place no mediastinal fluid collection small bilateral pleural effusions with bibasilar atelectasis pneumonia not excluded-scattered clusters nodular densities predominantly right upper and right lower lobes with nodule 11 mm as previously seen?  Mets?  Infection?  Aspiration 11/26 stent removed. Esophagram negative for esophageal leak.  11/27 getting diuresis again 11/28 duodenitis/enteritis.  GI consulted. Bloody emesis added carafate   11/29 trialing clears 12/1 NV again w/ hematemesis. Lasix  held ---  esophagram performed showing patent esophagus without perforation irregularity mid esophagus?  Stent diffuse narrowing lumen distal duodenum and proximal jejunum 12/2 EGD ischemic ulcers. Impression:  - Congested, inflamed, nodular, texture changed  mucosa in the esophagus. - Gastritis, Non-bleeding duodenal ulcers with a clean ulcer  base (Forrest Class III).- Non-bleeding jejunal ulcers with a clean ulcer   base (Forrest Class III)  - Findings suggestive of ischemic ulcers. Increased carafate  and cont BID PPI  12/3 full liq diet  12/4 marked increased WOB. Acute and  progressive w/ increased hypoxia and HTN. 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 12/17 off clev transition to iHD, transfer to floor  12/18: TDC placed 12/19 [name] assumed care, has distended and painful abdomen, KUB shows improvement so tube feeds resumed, Eliquis  resumed 12/22 hemoccult neg---transfused 1 U--- Eliquis  had to be held in the setting 12/23 vascular consulted secondary to lower extremities----x-ray shows congestion no pneumonia white count up-- 12/24 ABIs showed abnormal right and left toe brachial indices-no intervention from vascular required 12/26 severe respiratory distress needing nonrebreather 12/29, EP consulted for recurrent ventricular tachycardia, recurrent A-fib RVR> Amio gtt. 12/30, started on mexiletine 12/31, LFTs trending up, amiodarone  and mexiletine discontinued,  1/1: LFTs continue to worsen, HIDA scan ordered, palliative care consulted 1/2: HIDA scan negative for cholecystitis, stopping tube feeds which could be contributing to cholestasis Restarting IV heparin  for persistent A-fib      Subjective: - Continues to have some abdominal discomfort, no nausea or vomiting, due for dialysis today  Assessment and Plan:  Acute kidney injury Volume overload -sp CRRT 12/10-12/16, TDC placed 12/18 - ANA positive, C4 low, antihistone antibody positive, noted to have hematuria as well  - Renal biopsy 12/16 noted 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, felt to be secondary to infection  - Completed  course of antibiotics  - Currently on steroids per nephrology  - For HD today, monitoring for renal recovery - Also consulting palliative care with all above  ongoing issues and high risk of decompensation  Nonspecific leukocytosis -Afebrile, has completed multiple rounds of antibiotics - Ongoing prednisone  could be contributing, unfortunately with abnormal LFTs and mild abdominal tenderness chronic cholecystitis could also be a possibility, RUQ US  q/ GB wall thickening,   - HIDA scan finally completed this morning, negative for acute cholecystitis  Boerhaave syndrome Recurrent upper GI bleed - 10/7 had esophageal stent placed by CT surgery - 10/15, esophageal stent repositioned - 11/26: Esophageal stent removed, no further leak -Ischemic ulcer disease--GI and cardiothoracic aware-several CTs since 11/25---CT scan done 12/21 unremarkable, no Carafate  with HD, stop Maalox -Last esophagram as above 12/1 -Continue Protonix , changed to p.o. - No further bleeding now, will challenge with IV heparin  for persistent A-fib  Severe Malnutrition-BMI 22 evidence of weight loss -S/p PEG -Continue dysphagia 3 diet , holding TFs temporarily  Normocytic anemia secondary to peptic ulcer disease,?  Ongoing gastritis as above -Transfused PRBC x 1u in October, x1 unit 03/29/2024,12/23, 12/28  Cardiac arrest 06/2023 with HF MR EF 55-65% 03/27/2024  A-fib RVR CHADVASC >4 on Eliquis  [cardioverted 06/2023]--- recurrent RVR at times -Eliquis  has been held since 12/22  -cardiology following again for recurrent A-fib RVR -Now on low-dose Coreg , amiodarone  and mexiletine discontinued 12/31 with abnormal LFTs -Restart anticoagulation with heparin  today,   DVT PE March 2025 -does have IVC filter in place since March Lower limb ischemia ruled out 12.23 ABI rules out ischemia-appreciate VVS--no intervention needed - Restarting anticoagulation with heparin  today  Insomnia - Continued on regimen of trazodone   Seroquel  and hydroxyzine   Diabetes - CBG stable  Multiple bedsores and wounds anterior right and left foot and injury stage I to bottom -Continue Gerhardt twice daily Lidoderm  patch for back pain  VT - recurrent runs of VT 12/28 - Cards following, amiodarone  and mexiletine discontinued with rising LFTs, continue low-dose Coreg , recommended Toprol  if he has recurrence   Elevated LFT's -[place] phos 1116, AST 249, ALT 356 -On CT abd from 12/21 with mild biliary ductal dilation vs mild periportal edema and tiny layering stone in GB neck - Right upper quadrant ultrasound is nonspecific, ascites and gallbladder wall thickening, has mild epigastric and right sided tenderness on exam, HIDA scan negative for cholecystitis 1/2 - Suspect tube feeds could be contributing to cholestasis will discontinue and monitor - Was also on amiodarone  and mexiletine, these were discontinued 48 hours ago -stop lipitor   Resolved/stable problems Petechial rash Hydralazine  and Unasyn  have been stopped, cannot rule out HSP-this seems unlikely to me however he has mottling in general over his skin-see the pictures from 12/22 of his feet-seem stable to me Acute hypoxic respiratory failure//Aspiration Pneumonia//Pleural effusion//Acute on chronic diastolic CHF exacerbation       -respiratory failure due to pneumonia, pleural effusion, CHF exacerbation -- Better c Lasix  and antibiotics. Intemrittently seems to have this Bilateral UE swelling--duplex upper extremities ruled out DVT Lung nodules-repeat CT in Jan/Feb window       DVT prophylaxis: IV heparin  Code Status: FUll Code Family Communication: none present Disposition Plan: SNF if he improves   Consultants:    Procedures:   Antimicrobials:    Objective: Vitals:   04/19/24 0740 04/19/24 0750 04/19/24 0755 04/19/24 1150  BP: (!) 127/58   137/69  Pulse: 74   82  Resp: 19   20  Temp: 98.5 F (36.9 C)   97.8 F (36.6 C)  TempSrc: Oral   Oral   SpO2: 96% 95% 93% 95%  Weight:  Height:        Intake/Output Summary (Last 24 hours) at 04/19/2024 1254 Last data filed at 04/19/2024 0539 Gross per 24 hour  Intake 10 ml  Output 800 ml  Net -790 ml   Filed Weights   04/17/24 1557 04/18/24 0453 04/19/24 0532  Weight: 76.8 kg 78.1 kg 77.1 kg    Examination:  General exam: Chronically ill-appearing, AAO x 2 HEENT: Positive JVD, right IJ HD catheter CVS: S1-S2, irregular rhythm Lungs: Poor air movement, decreased at the bases Abdomen: Mildly distended, soft, mild right-sided tenderness, PEG tube noted Extremities: 1+ edema Psychiatry: Flat affect    Data Reviewed:   CBC: Recent Labs  Lab 04/14/24 1455 04/15/24 1150 04/16/24 0603 04/17/24 0416 04/18/24 0423 04/19/24 0500  WBC 12.5* 14.8* 11.5* 15.2* 14.6* 15.3*  NEUTROABS 11.4*  --   --   --   --   --   HGB 9.8* 7.7* 7.7* 7.7* 7.6* 8.7*  HCT 29.9* 23.7* 23.3* 23.3* 23.2* 26.8*  MCV 84.2 83.7 83.5 82.6 82.3 82.7  PLT 177 195 186 222 238 258   Basic Metabolic Panel: Recent Labs  Lab 04/13/24 0520 04/14/24 0500 04/14/24 0509 04/14/24 1455 04/15/24 0628 04/15/24 1004 04/15/24 1150 04/16/24 0603 04/17/24 0416 04/18/24 0423 04/19/24 0500  NA 131* 135  --  132* 135  --  133* 135 133* 134* 134*  K 4.8 4.1  --  4.7 4.6  --  4.8 4.2 4.3 4.5 4.7  CL 93* 96*  --  94* 94*  --  93* 96* 94* 95* 95*  CO2 28 30  --  28 28  --  28 30 28 30 29   GLUCOSE 88 172*  --  205* 188*  --  256* 159* 134* 148* 87  BUN 80* 65*  --  77* 85*  --  91* 62* 85* 59* 77*  CREATININE 3.37* 2.77*  --  3.13* 3.29*  --  3.49* 2.58* 3.20* 2.54* 3.23*  CALCIUM  7.8* 7.5*  --  7.9* 7.8*  --  7.7* 7.8* 7.9* 7.9* 8.3*  MG  --   --  1.9 2.0  --  2.2  --   --  2.3  --   --   PHOS 4.2 3.8  --   --  3.3  --   --   --   --   --   --    GFR: Estimated Creatinine Clearance: 22.9 mL/min (A) (by C-G formula based on SCr of 3.23 mg/dL (H)). Liver Function Tests: Recent Labs  Lab 04/15/24 0628  04/16/24 0603 04/17/24 0416 04/18/24 0423 04/19/24 0500  AST  --  249* 251* 346* 193*  ALT  --  356* 379* 492* 380*  ALKPHOS  --  1,116* 1,332* 1,782* 1,547*  BILITOT  --  1.2 1.3* 1.9* 1.8*  PROT  --  4.8* 4.9* 5.0* 5.1*  ALBUMIN  2.1* 2.0* 1.9* 1.9* 2.1*   No results for input(s): LIPASE, AMYLASE in the last 168 hours. No results for input(s): AMMONIA in the last 168 hours. Coagulation Profile: No results for input(s): INR, PROTIME in the last 168 hours. Cardiac Enzymes: No results for input(s): CKTOTAL, CKMB, CKMBINDEX, TROPONINI in the last 168 hours. BNP (last 3 results) No results for input(s): PROBNP in the last 8760 hours. HbA1C: No results for input(s): HGBA1C in the last 72 hours. CBG: Recent Labs  Lab 04/18/24 2009 04/19/24 0004 04/19/24 0534 04/19/24 0740 04/19/24 1152  GLUCAP 107* 85 90 85 95   Lipid  Profile: No results for input(s): CHOL, HDL, LDLCALC, TRIG, CHOLHDL, LDLDIRECT in the last 72 hours. Thyroid  Function Tests: No results for input(s): TSH, T4TOTAL, FREET4, T3FREE, THYROIDAB in the last 72 hours. Anemia Panel: No results for input(s): VITAMINB12, FOLATE, FERRITIN, TIBC, IRON , RETICCTPCT in the last 72 hours. Urine analysis:    Component Value Date/Time   COLORURINE YELLOW 03/27/2024 1122   APPEARANCEUR CLEAR 03/27/2024 1122   LABSPEC 1.025 03/27/2024 1122   PHURINE 5.5 03/27/2024 1122   GLUCOSEU NEGATIVE 03/27/2024 1122   HGBUR LARGE (A) 03/27/2024 1122   BILIRUBINUR SMALL (A) 03/27/2024 1122   KETONESUR NEGATIVE 03/27/2024 1122   PROTEINUR >300 (A) 03/27/2024 1122   UROBILINOGEN 0.2 08/31/2019 1136   NITRITE NEGATIVE 03/27/2024 1122   LEUKOCYTESUR TRACE (A) 03/27/2024 1122   Sepsis Labs: @LABRCNTIP (procalcitonin:4,lacticidven:4)  )No results found for this or any previous visit (from the past 240 hours).   Radiology Studies: NM Hepatobiliary Liver Func Result Date:  04/19/2024 EXAM: NM HEPATOBILLARY SCAN 04/19/2024 11:18:01 AM TECHNIQUE: RADIOPHARMACEUTICAL: 5.2 mCi Tc-29m mebrofenin  Dynamic images of the abdomen and pelvis were obtained in the anterior projection for 1 hour after intravenous administration of radiopharmaceutical. COMPARISON: Abdominal sonogram dated 04/16/2024. CLINICAL HISTORY: Cholecystitis. FINDINGS: Homogenous uptake within the liver. Normal clearance of the blood pool. Appropriate excretion into the biliary system. Activity is visualized in the gallbladder. IMPRESSION: 1. No scintigraphic evidence of acute cholecystitis. Electronically signed by: Timothy Berrigan MD 04/19/2024 12:28 PM EST RP Workstation: [id number]     Scheduled Meds:  atorvastatin   80 mg Per Tube Daily   carvedilol   3.125 mg Per Tube BID WC   Chlorhexidine  Gluconate Cloth  6 each Topical Daily   clonazePAM   0.5 mg Per Tube BID   enoxaparin  (LOVENOX ) injection  30 mg Subcutaneous Q24H   ezetimibe   10 mg Per Tube Daily   famotidine   20 mg Per Tube Daily   feeding supplement (PROSource TF20)  60 mL Per Tube Daily   fiber  1 packet Per Tube BID   [name] butt cream   Topical BID   guaiFENesin   10 mL Per Tube Q4H   insulin  aspart  0-9 Units Subcutaneous Q4H   lidocaine   1 patch Transdermal Q24H   multivitamin  1 tablet Per Tube QHS   pantoprazole  (PROTONIX ) IV  40 mg Intravenous Q12H   predniSONE   20 mg Oral Q breakfast   Followed by   NOREEN ON 04/21/2024] predniSONE   10 mg Oral Q breakfast   QUEtiapine   12.5 mg Per Tube QHS   saccharomyces boulardii  250 mg Per Tube BID   sodium chloride  flush  10-40 mL Intracatheter Q12H   torsemide   100 mg Oral Daily   Continuous Infusions:  feeding supplement (VITAL 1.5 CAL) 1,000 mL (04/18/24 0236)   promethazine  (PHENERGAN ) injection (IM or IVPB) 12.5 mg (04/05/24 1153)     LOS: 89 days    Time spent:    Sigurd Pac, MD [facility]   04/19/2024, 12:54 PM    "

## 2024-04-19 NOTE — Progress Notes (Signed)
 "  Rounding Note   Patient Name: Billy [name] Date of Encounter: 04/19/2024  [facility] Cardiologist: Dorn Lesches, MD   Subjective  No CP, denies SOB  Scheduled Meds:  atorvastatin   80 mg Per Tube Daily   carvedilol   3.125 mg Per Tube BID WC   Chlorhexidine  Gluconate Cloth  6 each Topical Daily   clonazePAM   0.5 mg Per Tube BID   enoxaparin  (LOVENOX ) injection  30 mg Subcutaneous Q24H   ezetimibe   10 mg Per Tube Daily   famotidine   20 mg Per Tube Daily   feeding supplement (PROSource TF20)  60 mL Per Tube Daily   fiber  1 packet Per Tube BID   [name] butt cream   Topical BID   guaiFENesin   10 mL Per Tube Q4H   insulin  aspart  0-9 Units Subcutaneous Q4H   lidocaine   1 patch Transdermal Q24H   multivitamin  1 tablet Per Tube QHS   pantoprazole  (PROTONIX ) IV  40 mg Intravenous Q12H   predniSONE   20 mg Oral Q breakfast   Followed by   NOREEN ON 04/21/2024] predniSONE   10 mg Oral Q breakfast   QUEtiapine   12.5 mg Per Tube QHS   saccharomyces boulardii  250 mg Per Tube BID   sodium chloride  flush  10-40 mL Intracatheter Q12H   torsemide   100 mg Oral Daily   Continuous Infusions:  feeding supplement (VITAL 1.5 CAL) 1,000 mL (04/18/24 0236)   promethazine  (PHENERGAN ) injection (IM or IVPB) 12.5 mg (04/05/24 1153)   PRN Meds: acetaminophen  (TYLENOL ) oral liquid 160 mg/5 mL, diclofenac  Sodium, glucagon  (human recombinant), hydrOXYzine , lip balm, mouth rinse, polyethylene glycol, promethazine  (PHENERGAN ) injection (IM or IVPB), simethicone , sodium chloride , sodium chloride  flush, technetium TC 99M  mebrofenin, traZODone    Vital Signs  Vitals:   04/19/24 0532 04/19/24 0740 04/19/24 0750 04/19/24 0755  BP: 117/65 (!) 127/58    Pulse: 75 74    Resp:  19    Temp: 97.8 F (36.6 C) 98.5 F (36.9 C)    TempSrc: Oral Oral    SpO2:  96% 95% 93%  Weight: 77.1 kg     Height:        Intake/Output Summary (Last 24 hours) at 04/19/2024 0933 Last data filed at 04/19/2024  0539 Gross per 24 hour  Intake 130 ml  Output 800 ml  Net -670 ml      04/19/2024    5:32 AM 04/18/2024    4:53 AM 04/17/2024    3:57 PM  Last 3 Weights  Weight (lbs) 169 lb 15.6 oz 172 lb 2.9 oz 169 lb 5 oz  Weight (kg) 77.1 kg 78.1 kg 76.8 kg      Telemetry  AFib 70's mostly, no brady or tachy. Low PVC burden, no NSVT or VT   - Personally Reviewed  ECG   No new EKGs - Personally Reviewed    Physical Exam  Seen and examined by Dr. Almetta this AM, no significant changes GEN: No acute distress, chronically ill appearing.   Neck: No JVD Cardiac: RRR, no murmurs, rubs, or gallops.  Respiratory: CTA b/l. GI: belly is Soft, nontender, PEG in place is clean/dry MS: trace edema; No deformity. Neuro:  Nonfocal  Psych: Normal affect    Labs High Sensitivity Troponin:  No results for input(s): TROPONINIHS in the last 720 hours. No results for input(s): TRNPT in the last 720 hours.     Chemistry Recent Labs  Lab 04/14/24 1455 04/15/24 0628 04/15/24 1004 04/15/24  1150 04/17/24 0416 04/18/24 0423 04/19/24 0500  NA 132*   < >  --    < > 133* 134* 134*  K 4.7   < >  --    < > 4.3 4.5 4.7  CL 94*   < >  --    < > 94* 95* 95*  CO2 28   < >  --    < > 28 30 29   GLUCOSE 205*   < >  --    < > 134* 148* 87  BUN 77*   < >  --    < > 85* 59* 77*  CREATININE 3.13*   < >  --    < > 3.20* 2.54* 3.23*  CALCIUM  7.9*   < >  --    < > 7.9* 7.9* 8.3*  MG 2.0  --  2.2  --  2.3  --   --   PROT  --   --   --    < > 4.9* 5.0* 5.1*  ALBUMIN   --    < >  --    < > 1.9* 1.9* 2.1*  AST  --   --   --    < > 251* 346* 193*  ALT  --   --   --    < > 379* 492* 380*  ALKPHOS  --   --   --    < > 1,332* 1,782* 1,547*  BILITOT  --   --   --    < > 1.3* 1.9* 1.8*  GFRNONAA 20*   < >  --    < > 20* 26* 20*  ANIONGAP 11   < >  --    < > 12 9 10    < > = values in this interval not displayed.    Lipids No results for input(s): CHOL, TRIG, HDL, LABVLDL, LDLCALC, CHOLHDL in the last  168 hours.  Hematology Recent Labs  Lab 04/17/24 0416 04/18/24 0423 04/19/24 0500  WBC 15.2* 14.6* 15.3*  RBC 2.82* 2.82* 3.24*  HGB 7.7* 7.6* 8.7*  HCT 23.3* 23.2* 26.8*  MCV 82.6 82.3 82.7  MCH 27.3 27.0 26.9  MCHC 33.0 32.8 32.5  RDW 17.4* 17.9* 17.9*  PLT 222 238 258   Thyroid  No results for input(s): TSH, FREET4 in the last 168 hours.  BNPNo results for input(s): BNP, PROBNP in the last 168 hours.  DDimer No results for input(s): DDIMER in the last 168 hours.   Radiology    Liver US  12/30: pending read  DG CHEST PORT 1 VIEW Result Date: 04/15/2024 EXAM: 1 VIEW(S) XRAY OF THE CHEST 04/15/2024 07:56:00 AM COMPARISON: 04/14/2024. CLINICAL HISTORY: Acute respiratory distress. FINDINGS: LINES, TUBES AND DEVICES: Right arm PICC line tip is in the projection of the SVC. There is also a right IJ dialysis catheter with tip at the superior cavoatrial junction. Loop recorder is noted in the projection of the left heart border. LUNGS AND PLEURA: Unchanged small bilateral pleural effusions. Persistent bilateral interstitial and airspace opacities. No pneumothorax. HEART AND MEDIASTINUM: No acute abnormality of the cardiac and mediastinal silhouettes. BONES AND SOFT TISSUES: No acute osseous abnormality. IMPRESSION: 1. Persistent bilateral interstitial and airspace opacities. 2. Unchanged small bilateral pleural effusions. Electronically signed by: Waddell Calk MD 04/15/2024 08:07 AM EST RP Workstation: [id number]   DG Chest Port 1 View Result Date: 04/14/2024 CLINICAL DATA:  Pulmonary edema. EXAM: PORTABLE CHEST 1 VIEW COMPARISON:  Radiograph yesterday FINDINGS: Stable positioning  of right-sided dialysis catheter and right upper extremity PICC. Slight improvement in heterogeneous interstitial opacities. Cardiomegaly is stable. Small pleural effusions are unchanged. Left chest wall loop recorder. No pneumothorax. IMPRESSION: 1. Slight improvement in heterogeneous interstitial  opacities, likely improving pulmonary edema. 2. Stable small pleural effusions. Electronically Signed   By: Andrea Gasman M.D.   On: 04/14/2024 16:42    Cardiac Studies   04/15/24: limited echo 1. Left ventricular ejection fraction, by estimation, is 60 to 65%. The  left ventricle has normal function. There is mild concentric left  ventricular hypertrophy. Left ventricular diastolic function could not be  evaluated.   2. Right ventricular systolic function is normal. The right ventricular  size is normal.   3. The mitral valve is normal in structure. No evidence of mitral valve  regurgitation.   4. The aortic valve is tricuspid. Aortic valve regurgitation is not  visualized. Aortic valve sclerosis/calcification is present, without any  evidence of aortic stenosis.   5. The inferior vena cava is normal in size with greater than 50%  respiratory variability, suggesting right atrial pressure of 3 mmHg.    TTE Result date: 03/27/24  1. Left ventricular ejection fraction, by estimation, is 55 to 60%. Left  ventricular ejection fraction by 2D MOD biplane is 59.1 %. The left  ventricle has normal function. The left ventricle demonstrates regional  wall motion abnormalities with basal  inferior akinesis and basal inferolateral akinesis. There is moderate  concentric left ventricular hypertrophy. Left ventricular diastolic  parameters are consistent with Grade II diastolic dysfunction  (pseudonormalization).   2. Right ventricular systolic function is mildly reduced. The right  ventricular size is normal. Tricuspid regurgitation signal is inadequate  for assessing PA pressure.   3. The mitral valve is normal in structure. No evidence of mitral valve  regurgitation. No evidence of mitral stenosis.   4. The aortic valve is tricuspid. There is moderate calcification of the  aortic valve. Aortic valve regurgitation is not visualized. Aortic valve  sclerosis/calcification is present,  without any evidence of aortic  stenosis.   5. The inferior vena cava is normal in size with greater than 50%  respiratory variability, suggesting right atrial pressure of 3 mmHg.    cMRI  Result date: 07/07/23 1. Study is less consistent with cardiac amyloidosis or infiltrative disease. Study is more consistent with inferolateral territory RCA or LCX disease (with ECV elevation but without notable scar). Though septum is 15 mm, there are no other class features consistent with hypertrophic cardiomyopathy. 2.  Low normal LVEF (52%) with mild LV dilation. 3.  Right ventricular dilation with normalization of function. 4. Severe right atrial dilation. Mild pulmonary artery dilation. Moderate tricuspid regurgitation.     Patient Profile   74 y.o. 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, mild PAH, persistent AF/AFL, ESRD on iHD, Boerhaave syndrome with esophageal stent with subsequent esophageal stent removal, duodenitis, enteritis   DOA 01/21/24 He has had a long complicated hospitalization Presentation: N/V >> esophageal tear  Assessment & Plan   Persistent AFib AFlutter/coarse Afib CHA2DS2Vasc is 8 Eliquis  out patient > a/c on/off here >> currently off w/recurrent anemia requiring PRBC Rate controlled Resume anticoagulation as soon as felt safe to do so > deferred to IM team H/H stable (management deferred to IM)   NSVT, VT Hx of VF arrest  in setting of acute PE/RV failure in March 2025 cath then with CTO RCA, non-obstructive dz otherwise. He started having some NSVTs and sustained MMVT here and amiodarone  (chronic for him) > gtt, mexiletine started Last sustained VT was 12/29 morning PVC burden much improved as well Amio and mexiletine stopped 12/31 2/2 persistent rise in LFTs LFTs Slightly better today No significant  PVC burden, no NSVT or VT  If increased PVCs or return of VT > will plan to transition to Toprol  and titrate as able    3. HFpEF Volume with nephrology team O2 requirements are improving      For questions or updates, please contact [facility] Please consult [email or web address] for contact info under   Signed, Charlies Macario Arthur, PA-C  04/19/2024, 9:33 AM    "

## 2024-04-20 DIAGNOSIS — Z7189 Other specified counseling: Secondary | ICD-10-CM | POA: Diagnosis not present

## 2024-04-20 DIAGNOSIS — K223 Perforation of esophagus: Secondary | ICD-10-CM | POA: Diagnosis not present

## 2024-04-20 DIAGNOSIS — I5023 Acute on chronic systolic (congestive) heart failure: Secondary | ICD-10-CM | POA: Diagnosis not present

## 2024-04-20 DIAGNOSIS — Z515 Encounter for palliative care: Secondary | ICD-10-CM | POA: Diagnosis not present

## 2024-04-20 LAB — GLUCOSE, CAPILLARY
Glucose-Capillary: 82 mg/dL (ref 70–99)
Glucose-Capillary: 89 mg/dL (ref 70–99)
Glucose-Capillary: 112 mg/dL — ABNORMAL HIGH (ref 70–99)
Glucose-Capillary: 119 mg/dL — ABNORMAL HIGH (ref 70–99)
Glucose-Capillary: 143 mg/dL — ABNORMAL HIGH (ref 70–99)

## 2024-04-20 LAB — CBC
HCT: 23.1 % — ABNORMAL LOW (ref 39.0–52.0)
Hemoglobin: 7.5 g/dL — ABNORMAL LOW (ref 13.0–17.0)
MCH: 26.7 pg (ref 26.0–34.0)
MCHC: 32.5 g/dL (ref 30.0–36.0)
MCV: 82.2 fL (ref 80.0–100.0)
Platelets: 298 K/uL (ref 150–400)
RBC: 2.81 MIL/uL — ABNORMAL LOW (ref 4.22–5.81)
RDW: 18.2 % — ABNORMAL HIGH (ref 11.5–15.5)
WBC: 15.9 K/uL — ABNORMAL HIGH (ref 4.0–10.5)
nRBC: 0 % (ref 0.0–0.2)

## 2024-04-20 LAB — CK: Total CK: 35 U/L — ABNORMAL LOW (ref 49–397)

## 2024-04-20 LAB — COMPREHENSIVE METABOLIC PANEL WITH GFR
ALT: 299 U/L — ABNORMAL HIGH (ref 0–44)
AST: 173 U/L — ABNORMAL HIGH (ref 15–41)
Albumin: 1.9 g/dL — ABNORMAL LOW (ref 3.5–5.0)
Alkaline Phosphatase: 1835 U/L — ABNORMAL HIGH (ref 38–126)
Anion gap: 8 (ref 5–15)
BUN: 49 mg/dL — ABNORMAL HIGH (ref 8–23)
CO2: 31 mmol/L (ref 22–32)
Calcium: 8.1 mg/dL — ABNORMAL LOW (ref 8.9–10.3)
Chloride: 95 mmol/L — ABNORMAL LOW (ref 98–111)
Creatinine, Ser: 2.59 mg/dL — ABNORMAL HIGH (ref 0.61–1.24)
GFR, Estimated: 26 mL/min — ABNORMAL LOW
Glucose, Bld: 91 mg/dL (ref 70–99)
Potassium: 4.1 mmol/L (ref 3.5–5.1)
Sodium: 134 mmol/L — ABNORMAL LOW (ref 135–145)
Total Bilirubin: 2.3 mg/dL — ABNORMAL HIGH (ref 0.0–1.2)
Total Protein: 5 g/dL — ABNORMAL LOW (ref 6.5–8.1)

## 2024-04-20 LAB — HEPARIN LEVEL (UNFRACTIONATED): Heparin Unfractionated: 0.41 [IU]/mL (ref 0.30–0.70)

## 2024-04-20 MED ORDER — PANTOPRAZOLE SODIUM 40 MG PO TBEC
40.0000 mg | DELAYED_RELEASE_TABLET | Freq: Two times a day (BID) | ORAL | Status: DC
  Administered 2024-04-20 – 2024-04-26 (×13): 40 mg via ORAL
  Filled 2024-04-20 (×14): qty 1

## 2024-04-20 NOTE — Progress Notes (Signed)
 PHARMACY - ANTICOAGULATION CONSULT NOTE  Pharmacy Consult for Heparin  Indication: atrial fibrillation and DVT  Allergies[1]  Patient Measurements: Height: 6' 1 (185.4 cm) Weight: 75.5 kg (166 lb 7.2 oz) IBW/kg (Calculated) : 79.9 HEPARIN  DW (KG): 75.9  Vital Signs: Temp: 97.8 F (36.6 C) (01/03 0715) Temp Source: Oral (01/03 0715) BP: 127/66 (01/03 0715) Pulse Rate: 107 (01/03 0715)  Labs: Recent Labs    04/18/24 0423 04/19/24 0500 04/19/24 1425 04/19/24 2009 04/20/24 0607  HGB 7.6* 8.7*  --   --  7.5*  HCT 23.2* 26.8*  --   --  23.1*  PLT 238 258  --   --  298  APTT  --   --  34 80*  --   HEPARINUNFRC  --   --   --  0.31 0.41  CREATININE 2.54* 3.23*  --   --  2.59*    Estimated Creatinine Clearance: 27.9 mL/min (A) (by C-G formula based on SCr of 2.59 mg/dL (H)).  Assessment: Kalil Woessner is a 74 y.o. year old male admitted on 01/21/2024 with concern for esophageal rupture. On eliquis  prior to admission for afib and hx DVT (last dose pta 12/9 PM). Pharmacy consulted to dose heparin  for afib with no bolus.    04/21/2023: Heparin  level therapeutic at 0.41 on 1500 units/hr. No issues with bleeding or pauses in drip per RN. CBC stable.  Goal of Therapy:  Heparin  level 0.3-0.5 units/ml Monitor platelets by anticoagulation protocol: Yes   Plan:  Continue Heparin  drip at 1500 uts/hr  Daily heparin  level,and CBC   R. Samual Satterfield, PharmD PGY-1 Acute Care Pharmacy Resident [facility] Please refer to AMION for [facility] Pharmacy numbers 04/20/2024 7:31 AM             [1]  Allergies Allergen Reactions   Hydralazine  Hcl     Concern for drug induced lupus   Norvasc  [Amlodipine ] Swelling and Other (See Comments)    Excessive BLE swelling Skin discoloration, blotching   Jardiance  [Empagliflozin ] Itching and Other (See Comments)    Patient mentioned penile swelling, pain   Morphine  And Codeine Itching    Opioid-induced pruritus

## 2024-04-20 NOTE — Progress Notes (Addendum)
 " PROGRESS NOTE    Billy [name]  FMW:[id number] DOB: Jul 24, 1950 DOA: 01/21/2024 PCP: Seabron Lenis, MD  Chronically ill 73/M prolonged hospitalization from 3/10-4/3 with cardiac arrest, A-fib, DVTs, hematochezia, IVC filter, chest tubes, eventually stabilized, discharged to rehab and then went home, readmitted 10/5 with large esophageal tear 10/7 esophageal stent placed by cardiothoracic 10/15 stent reposition 10/16 extubated 10/20 reintubated with ultrasound showing left-sided pleural effusion-left-sided chest tube placed-by lights criteria exudative effusion 10/23 echo 60-65% 10/24 barium swallow showed persistent leak 10/28 transferred out of ICU 11/3 PEG tube placed 11/19 swelling of right arm showing on ultrasound SVT cephalic vein 11/22 chest x-ray?  Pneumonia done because of back pain-follow-up CT scan shows stent in place no mediastinal fluid collection small bilateral pleural effusions with bibasilar atelectasis pneumonia not excluded-scattered clusters nodular densities predominantly right upper and right lower lobes with nodule 11 mm as previously seen?  Mets?  Infection?  Aspiration 11/26 stent removed. Esophagram negative for esophageal leak.  11/27 getting diuresis again 11/28 duodenitis/enteritis.  GI consulted. Bloody emesis added carafate   11/29 trialing clears 12/1 NV again w/ hematemesis. Lasix  held ---  esophagram performed showing patent esophagus without perforation irregularity mid esophagus?  Stent diffuse narrowing lumen distal duodenum and proximal jejunum 12/2 EGD ischemic ulcers. Impression:  - Congested, inflamed, nodular, texture changed  mucosa in the esophagus. - Gastritis, Non-bleeding duodenal ulcers with a clean ulcer  base (Forrest Class III).- Non-bleeding jejunal ulcers with a clean ulcer   base (Forrest Class III)  - Findings suggestive of ischemic ulcers. Increased carafate  and cont BID PPI  12/3 full liq diet  12/4 marked increased WOB. Acute and  progressive w/ increased hypoxia and HTN. 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 12/17 off clev transition to iHD, transfer to floor  12/18: TDC placed 12/19 [name] assumed care, has distended and painful abdomen, KUB shows improvement so tube feeds resumed, Eliquis  resumed 12/22 hemoccult neg---transfused 1 U--- Eliquis  had to be held in the setting 12/23 vascular consulted secondary to lower extremities----x-ray shows congestion no pneumonia white count up-- 12/24 ABIs showed abnormal right and left toe brachial indices-no intervention from vascular required 12/26 severe respiratory distress needing nonrebreather 12/29, EP consulted for recurrent ventricular tachycardia, recurrent A-fib RVR> Amio gtt. 12/30, started on mexiletine 12/31, LFTs trending up, amiodarone  and mexiletine discontinued,  1/1: LFTs continue to worsen, HIDA scan ordered, palliative care consulted 1/2: HIDA scan negative for cholecystitis, holding tube feeds which could be contributing to cholestasis Restarting IV heparin  for persistent A-fib       1/3: AST ALT improving, alk phos still significantly elevated      Subjective: - Continues to have mild abdominal discomfort, having BMs, no nausea or vomiting  Assessment and Plan:  Acute kidney injury, new ESRD Volume overload -sp CRRT 12/10-12/16, TDC placed 12/18 - ANA positive, C4 low, antihistone antibody positive, noted to have hematuria as well  - Renal biopsy 12/16 noted 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  complex deposits, felt to be secondary to infection  - Completed course of antibiotics  - Currently on steroids per nephrology  -  sp HD yesterday, monitoring for renal recovery - Consulted palliative care with all above ongoing issues and high risk of decompensation, wishes for full code and full scope for now  Nonspecific leukocytosis -Afebrile, has completed multiple rounds of antibiotics - Ongoing prednisone  could be contributing, with abnormal LFTs and mild abdominal tenderness chronic cholecystitis was felt to be a possibility, RUQ US  q/ GB wall thickening,   - HIDA scan finally completed 1/2 morning, negative for acute cholecystitis  Boerhaave syndrome Recurrent upper GI bleed - 10/7 had esophageal stent placed by CT surgery - 10/15, esophageal stent repositioned - 11/26: Esophageal stent removed, no further leak -Ischemic ulcer disease--GI and cardiothoracic aware-several CTs since 11/25---CT scan done 12/21 unremarkable, no Carafate  with HD, stop Maalox -Last esophagram as above 12/1 -Continue Protonix , changed to p.o. - No further bleeding now, will challenge with IV heparin  for persistent A-fib  Elevated LFT's - LFTs significantly elevated since 12/30, had not been checked for 20 days prior, could have been slowly trending up -On CT abd from 12/21 with mild biliary ductal dilation vs mild periportal edema and tiny layering stone in GB neck - Right upper quadrant ultrasound was nonspecific, ascites and gallbladder wall thickening, has mild epigastric and right sided tenderness on exam, HIDA scan negative for cholecystitis 1/2 - Held tube feeds in case that could be contributing to cholestasis - Amiodarone  discontinued 12/31, 1/1 mexiletine was discontinued due to predominant hepatic metabolism -stop lipitor  and Tylenol  - Suspect could have had a shock liver type injury BP dropped to 90s 12/28-29, LFTs slowly improving, alk phos still significantly elevated however no obstructive  pattern on imaging - Continue to trend, will d/w [facility] if it continues to worsem  Severe Malnutrition-BMI 22 evidence of weight loss -S/p PEG -Continue dysphagia 3 diet , holding TFs temporarily  Normocytic anemia secondary to peptic ulcer disease,?  Ongoing gastritis as above -Transfused PRBC x 1u in October, x1 unit 03/29/2024,12/23, 12/28  Cardiac arrest 06/2023 with HF MR EF 55-65% 03/27/2024  A-fib RVR CHADVASC >4 on Eliquis  [cardioverted 06/2023]--- recurrent RVR at times -Eliquis  has been held since 12/22  -cardiology following again for recurrent A-fib RVR -Now on low-dose Coreg , amiodarone  and mexiletine discontinued 12/31 with abnormal LFTs - We started IV heparin  1/2   DVT PE March 2025 -does have IVC filter in place since March Lower limb ischemia ruled out 12.23 ABI rules out ischemia-appreciate VVS--no intervention needed - Restarting anticoagulation with heparin  today  Insomnia - Continued on regimen of trazodone  Seroquel  and hydroxyzine   Diabetes - CBG stable  Multiple bedsores and wounds anterior right and left foot and injury stage I to bottom -Continue Gerhardt twice daily Lidoderm  patch for back pain  VT - recurrent runs of VT 12/28 - Cards following, amiodarone  and mexiletine discontinued with rising LFTs, continue low-dose Coreg , recommended Toprol  if he has recurrence   Resolved/stable problems Petechial rash Hydralazine  and Unasyn  have been stopped, cannot rule out HSP-this seems unlikely to me however he has mottling in general over his skin-see the pictures from 12/22 of his feet-seem stable to me Acute hypoxic respiratory failure//Aspiration Pneumonia//Pleural effusion//Acute on chronic diastolic CHF exacerbation       -respiratory failure due to pneumonia, pleural effusion, CHF exacerbation -- Better c Lasix  and antibiotics. Intemrittently seems to have this Bilateral UE swelling--duplex upper extremities ruled out DVT Lung nodules-repeat CT in  Jan/Feb window       DVT prophylaxis: IV heparin  Code Status: FUll Code Family Communication: none present  Disposition Plan: SNF if he improves   Consultants:    Procedures:   Antimicrobials:    Objective: Vitals:   04/20/24 0416 04/20/24 0715 04/20/24 0900 04/20/24 0904  BP: 119/65 127/66  127/66  Pulse: 77 (!) 107  84  Resp: 19 (!) 21    Temp: 97.9 F (36.6 C) 97.8 F (36.6 C)    TempSrc: Oral Oral    SpO2: 100% 100% 100%   Weight: 75.5 kg     Height:        Intake/Output Summary (Last 24 hours) at 04/20/2024 1004 Last data filed at 04/19/2024 2130 Gross per 24 hour  Intake 340 ml  Output 2100 ml  Net -1760 ml   Filed Weights   04/19/24 1521 04/19/24 1904 04/20/24 0416  Weight: 77.1 kg 75.1 kg 75.5 kg    Examination:  General exam: Chronically ill-appearing, AAO x 2, no distress HEENT: Positive JVD, right IJ HD catheter CVS: S1-S2, irregular rhythm Lungs: Poor air movement, decreased at the bases Abdomen: Mildly distended, soft, mild right-sided tenderness, PEG tube noted Extremities: 1+ edema Psychiatry: Flat affect    Data Reviewed:   CBC: Recent Labs  Lab 04/14/24 1455 04/15/24 1150 04/16/24 0603 04/17/24 0416 04/18/24 0423 04/19/24 0500 04/20/24 0607  WBC 12.5*   < > 11.5* 15.2* 14.6* 15.3* 15.9*  NEUTROABS 11.4*  --   --   --   --   --   --   HGB 9.8*   < > 7.7* 7.7* 7.6* 8.7* 7.5*  HCT 29.9*   < > 23.3* 23.3* 23.2* 26.8* 23.1*  MCV 84.2   < > 83.5 82.6 82.3 82.7 82.2  PLT 177   < > 186 222 238 258 298   < > = values in this interval not displayed.   Basic Metabolic Panel: Recent Labs  Lab 04/14/24 0500 04/14/24 0509 04/14/24 1455 04/15/24 0628 04/15/24 1004 04/15/24 1150 04/16/24 0603 04/17/24 0416 04/18/24 0423 04/19/24 0500 04/20/24 0607  NA 135  --  132* 135  --    < > 135 133* 134* 134* 134*  K 4.1  --  4.7 4.6  --    < > 4.2 4.3 4.5 4.7 4.1  CL 96*  --  94* 94*  --    < > 96* 94* 95* 95* 95*  CO2 30  --  28 28  --     < > 30 28 30 29 31   GLUCOSE 172*  --  205* 188*  --    < > 159* 134* 148* 87 91  BUN 65*  --  77* 85*  --    < > 62* 85* 59* 77* 49*  CREATININE 2.77*  --  3.13* 3.29*  --    < > 2.58* 3.20* 2.54* 3.23* 2.59*  CALCIUM  7.5*  --  7.9* 7.8*  --    < > 7.8* 7.9* 7.9* 8.3* 8.1*  MG  --  1.9 2.0  --  2.2  --   --  2.3  --   --   --   PHOS 3.8  --   --  3.3  --   --   --   --   --   --   --    < > = values in this interval not displayed.   GFR: Estimated Creatinine Clearance: 27.9 mL/min (A) (by C-G formula based on SCr of 2.59 mg/dL (H)). Liver Function Tests: Recent Labs  Lab 04/16/24 [phone] 04/17/24 0416 04/18/24  0423 04/19/24 0500 04/20/24 0607  AST 249* 251* 346* 193* 173*  ALT 356* 379* 492* 380* 299*  ALKPHOS 1,116* 1,332* 1,782* 1,547* 1,835*  BILITOT 1.2 1.3* 1.9* 1.8* 2.3*  PROT 4.8* 4.9* 5.0* 5.1* 5.0*  ALBUMIN  2.0* 1.9* 1.9* 2.1* 1.9*   No results for input(s): LIPASE, AMYLASE in the last 168 hours. No results for input(s): AMMONIA in the last 168 hours. Coagulation Profile: No results for input(s): INR, PROTIME in the last 168 hours. Cardiac Enzymes: No results for input(s): CKTOTAL, CKMB, CKMBINDEX, TROPONINI in the last 168 hours. BNP (last 3 results) No results for input(s): PROBNP in the last 8760 hours. HbA1C: No results for input(s): HGBA1C in the last 72 hours. CBG: Recent Labs  Lab 04/19/24 1152 04/19/24 1932 04/19/24 2330 04/20/24 0427 04/20/24 0726  GLUCAP 95 77 126* 82 89   Lipid Profile: No results for input(s): CHOL, HDL, LDLCALC, TRIG, CHOLHDL, LDLDIRECT in the last 72 hours. Thyroid  Function Tests: No results for input(s): TSH, T4TOTAL, FREET4, T3FREE, THYROIDAB in the last 72 hours. Anemia Panel: No results for input(s): VITAMINB12, FOLATE, FERRITIN, TIBC, IRON , RETICCTPCT in the last 72 hours. Urine analysis:    Component Value Date/Time   COLORURINE YELLOW 03/27/2024 1122    APPEARANCEUR CLEAR 03/27/2024 1122   LABSPEC 1.025 03/27/2024 1122   PHURINE 5.5 03/27/2024 1122   GLUCOSEU NEGATIVE 03/27/2024 1122   HGBUR LARGE (A) 03/27/2024 1122   BILIRUBINUR SMALL (A) 03/27/2024 1122   KETONESUR NEGATIVE 03/27/2024 1122   PROTEINUR >300 (A) 03/27/2024 1122   UROBILINOGEN 0.2 08/31/2019 1136   NITRITE NEGATIVE 03/27/2024 1122   LEUKOCYTESUR TRACE (A) 03/27/2024 1122   Sepsis Labs: @LABRCNTIP (procalcitonin:4,lacticidven:4)  )No results found for this or any previous visit (from the past 240 hours).   Radiology Studies: NM Hepatobiliary Liver Func Result Date: 04/19/2024 EXAM: NM HEPATOBILLARY SCAN 04/19/2024 11:18:01 AM TECHNIQUE: RADIOPHARMACEUTICAL: 5.2 mCi Tc-42m mebrofenin  Dynamic images of the abdomen and pelvis were obtained in the anterior projection for 1 hour after intravenous administration of radiopharmaceutical. COMPARISON: Abdominal sonogram dated 04/16/2024. CLINICAL HISTORY: Cholecystitis. FINDINGS: Homogenous uptake within the liver. Normal clearance of the blood pool. Appropriate excretion into the biliary system. Activity is visualized in the gallbladder. IMPRESSION: 1. No scintigraphic evidence of acute cholecystitis. Electronically signed by: Evalene Coho MD 04/19/2024 12:28 PM EST RP Workstation: [id number]     Scheduled Meds:  carvedilol   3.125 mg Per Tube BID WC   Chlorhexidine  Gluconate Cloth  6 each Topical Daily   clonazePAM   0.5 mg Per Tube BID   famotidine   20 mg Per Tube Daily   feeding supplement (NEPRO CARB STEADY)  237 mL Oral BID BM   [name] butt cream   Topical BID   guaiFENesin   10 mL Per Tube Q4H   insulin  aspart  0-9 Units Subcutaneous Q4H   lidocaine   1 patch Transdermal Q24H   multivitamin  1 tablet Oral QHS   pantoprazole  (PROTONIX ) IV  40 mg Intravenous Q12H   predniSONE   20 mg Oral Q breakfast   Followed by   NOREEN ON 04/21/2024] predniSONE   10 mg Oral Q breakfast   QUEtiapine   12.5 mg Per Tube QHS    saccharomyces boulardii  250 mg Per Tube BID   sodium chloride  flush  10-40 mL Intracatheter Q12H   torsemide   100 mg Oral Daily   Continuous Infusions:  heparin  1,500 Units/hr (04/20/24 9366)   promethazine  (PHENERGAN ) injection (IM or IVPB) 12.5 mg (04/05/24 1153)     LOS:  90 days    Time spent:    Sigurd Pac, MD [facility]   04/20/2024, 10:04 AM    "

## 2024-04-20 NOTE — Progress Notes (Signed)
 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  He has completed rounds of antibiotics.  Mild interstitial fibrosis/tubular atrophy.  Given concern for infection we provided steroids but avoided other IS medications due to weighing risks/benefits on pred taper now with plans to wean quickly given the main concern is infection as a cause he was on minoxidil  which caries similar risk for AI kidney phenomena as hydralazine . Stopped this (last dose 12/22)  Oliguric but no significant evidence of GFR recovery, continue dialysis -did receive contrast on 12/21 which may hinder Crt improvement Continue HD on MWF schedule: HD next Mon  Petechial rash;             - 2/2 Iga. S/p steroids, improved   Acute on chronic CHF exacerbation;             - HD and diuretics as aboven daily torsemide    H/o DVT and PE:             - Anticoagulation per cardiology and primary service   Boorehave's syndrome             - s/p repair             - Has a PEG  - EGD 03/19/24 with ulcers  - PPI and pepcid    HTN  - Carvedilol , followed BPs with UF  Dispo: Likely SNF - pending placement as of 1/2, To go to [facility] at  time of discharge   Subjective:   No major interval events, no complaints this morning Had HD UF 2L yesterday - no issues Pall care c/s yesterday - cont full scope but ongoing discussions   Objective:   BP 127/66   Pulse 84   Temp 97.8 F (36.6 C) (Oral)   Resp (!) 21   Ht 6' 1 (1.854 m)   Wt 75.5 kg   SpO2 100%   BMI 21.96 kg/m   Intake/Output Summary (Last 24 hours) at 04/20/2024 0954 Last data filed at  04/19/2024 2130 Gross per 24 hour  Intake 340 ml  Output 2100 ml  Net -1760 ml    Weight change: 0 kg  Physical Exam: GEN: Lying in bed, calm PULM: normal WOB, clear ant CV: Normal rate, regular rhythm ABD: + PEG, mild distention EXT: trace edema NEURO: awake, alert, interactive Dialysis access: RIJ HD catheter  Imaging: NM Hepatobiliary Liver Func Result Date: 04/19/2024 EXAM: NM HEPATOBILLARY SCAN 04/19/2024 11:18:01 AM TECHNIQUE: RADIOPHARMACEUTICAL: 5.2 mCi Tc-45m mebrofenin  Dynamic images of the abdomen and pelvis were obtained in the anterior projection for 1 hour after intravenous administration of radiopharmaceutical. COMPARISON: Abdominal sonogram dated 04/16/2024. CLINICAL HISTORY: Cholecystitis. FINDINGS: Homogenous uptake within the liver. Normal clearance of the blood pool. Appropriate excretion into the biliary system. Activity is visualized in the gallbladder. IMPRESSION: 1. No scintigraphic evidence of acute cholecystitis. Electronically signed by: Evalene Coho MD 04/19/2024 12:28 PM EST RP Workstation: [id number]      Labs: BMET Recent Labs  Lab 04/14/24 0500 04/14/24 1455 04/15/24 9371 04/15/24 1150 04/16/24 0603 04/17/24 0416 04/18/24 0423 04/19/24 0500 04/20/24 0607  NA 135   < > 135 133* 135 133* 134* 134* 134*  K 4.1   < > 4.6 4.8 4.2 4.3 4.5 4.7 4.1  CL 96*   < > 94* 93* 96* 94* 95* 95* 95*  CO2 30   < > 28 28 30 28 30 29 31   GLUCOSE 172*   < > 188* 256* 159* 134* 148* 87 91  BUN 65*   < > 85* 91* 62* 85* 59* 77* 49*  CREATININE 2.77*   < > 3.29* 3.49* 2.58* 3.20* 2.54* 3.23* 2.59*  CALCIUM  7.5*   < > 7.8* 7.7* 7.8* 7.9* 7.9* 8.3* 8.1*  PHOS 3.8  --  3.3  --   --   --   --   --   --    < > = values in this interval not displayed.   CBC Recent Labs  Lab 04/14/24 1455 04/15/24 1150 04/17/24 0416 04/18/24 0423 04/19/24 0500 04/20/24 0607  WBC 12.5*   < > 15.2* 14.6* 15.3* 15.9*  NEUTROABS 11.4*  --   --   --   --   --   HGB 9.8*   < >  7.7* 7.6* 8.7* 7.5*  HCT 29.9*   < > 23.3* 23.2* 26.8* 23.1*  MCV 84.2   < > 82.6 82.3 82.7 82.2  PLT 177   < > 222 238 258 298   < > = values in this interval not displayed.    Medications:     atorvastatin   80 mg Per Tube Daily   carvedilol   3.125 mg Per Tube BID WC   Chlorhexidine  Gluconate Cloth  6 each Topical Daily   clonazePAM   0.5 mg Per Tube BID   ezetimibe   10 mg Per Tube Daily   famotidine   20 mg Per Tube Daily   feeding supplement (NEPRO CARB STEADY)  237 mL Oral BID BM   [name] butt cream   Topical BID   guaiFENesin   10 mL Per Tube Q4H   insulin  aspart  0-9 Units Subcutaneous Q4H   lidocaine   1 patch Transdermal Q24H   multivitamin  1 tablet Oral QHS   pantoprazole  (PROTONIX ) IV  40 mg Intravenous Q12H   predniSONE   20 mg Oral Q breakfast   Followed by   NOREEN ON 04/21/2024] predniSONE   10 mg Oral Q breakfast   QUEtiapine   12.5 mg Per Tube QHS   saccharomyces boulardii  250 mg Per Tube BID   sodium chloride  flush  10-40 mL Intracatheter Q12H   torsemide   100 mg Oral Daily

## 2024-04-20 NOTE — Progress Notes (Signed)
 "                                                                                                                                                         Daily Progress Note   Patient Name: Billy [name]       Date: 04/20/2024 DOB: 1950-07-06  Age: 74 y.o. MRN#: [id number] Attending Physician: Fairy Frames, MD Primary Care Physician: Seabron Lenis, MD Admit Date: 01/21/2024  Reason for Consultation/Follow-up: Establishing goals of care  Subjective: Medical records reviewed including progress notes, labs and imaging. Patient assessed at the bedside.  He reports sleeping all right last night.  Tolerated HD well.  No other concerns at this time.  Created space and opportunity for patient's thoughts and feelings on his current illness.  He does not have any questions or concerns after our conversation yesterday.  He does not feel like it would be helpful to discuss goals of care again today.  Shared that I would be back on service Tuesday and provided PMT contact information if he has needs in the meantime.  He is adult nurse.  Declined spiritual care consult today.  PMT will continue to support holistically.  Length of Stay: 90   Physical Exam Vitals and nursing note reviewed.  Constitutional:      General: He is not in acute distress.    Appearance: He is ill-appearing.  HENT:     Head: Normocephalic and atraumatic.  Cardiovascular:     Rate and Rhythm: Tachycardia present.  Pulmonary:     Effort: Pulmonary effort is normal. No respiratory distress.  Neurological:     Mental Status: He is alert and oriented to person, place, and time.  Psychiatric:        Mood and Affect: Affect is flat.        Cognition and Memory: Cognition normal.            Vital Signs: BP 127/66 (BP Location: Left Arm)   Pulse (!) 107   Temp 97.8 F (36.6 C) (Oral)   Resp (!) 21   Ht 6' 1 (1.854 m)   Wt 75.5 kg   SpO2 100%   BMI 21.96 kg/m  SpO2: SpO2: 100 % O2 Device: O2 Device: Nasal Cannula O2  Flow Rate: O2 Flow Rate (L/min): 3 L/min      Palliative Assessment/Data: 40%   Palliative Care Assessment & Plan   Patient Profile: 74 y.o. 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  Patient had stomach virus with nausea and vomiting for days up until this event.   Hospital events as follows:  10/7 esophageal stent placed by cardiothoracic 10/15 stent reposition 10/16 extubated 10/20 reintubated with ultrasound showing left-sided pleural effusion-left-sided chest tube placed-by lights criteria exudative effusion 10/23 echo 60-65% 10/24 barium swallow showed persistent leak 10/28 transferred out of ICU 11/3 PEG tube placed 11/19 swelling of right arm showing on ultrasound SVT cephalic vein 11/22 chest x-ray?  Pneumonia done because of back pain-follow-up CT scan shows stent in place no mediastinal fluid collection small bilateral pleural effusions with bibasilar atelectasis pneumonia not excluded-scattered clusters nodular densities predominantly right upper and right lower lobes with nodule 11 mm as previously seen?  Mets?  Infection?  Aspiration 11/26 stent removed. Esophagram negative for esophageal leak.  11/27 getting diuresis again 11/28 duodenitis/enteritis.  GI consulted. Bloody emesis added carafate   11/29 trialing clears 12/1 NV again w/ hematemesis. Lasix  held ---  esophagram performed showing patent esophagus without perforation irregularity mid esophagus?  Stent diffuse narrowing lumen distal duodenum and proximal jejunum 12/2 EGD ischemic ulcers. Impression:  - Congested, inflamed, nodular, texture changed  mucosa in the esophagus. - Gastritis, Non-bleeding duodenal ulcers with a clean ulcer  base (Forrest  Class III).- Non-bleeding jejunal ulcers with a clean ulcer   base (Forrest Class III)  - Findings suggestive of ischemic ulcers. Increased carafate  and cont BID PPI  12/3 full liq diet  12/4 marked increased WOB. Acute and progressive w/ increased hypoxia and HTN. 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 12/30, started on mexiletine 12/31, LFTs trending up, amiodarone  and mexiletine discontinued, FU RUQ US    PMT has been consulted day 88 to assist with goals of care conversation in light of poor long-term prognosis, this week's respiratory distress, sustained VT on 12/29, and A-fib with RVR.  PMT is familiar with patient from prolonged hospitalization 3/10-4/3.  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  Please call team line or secure chat if urgent needs arise   Prognosis: Poor long-term prognosis  Discharge Planning: SNF with outpatient palliative care (previously established with [facility])   Care plan was discussed with patient   Samson Ralph SHAUNNA Fell, PA-C  Palliative Medicine Team Team phone # [phone]  Thank you for allowing the Palliative Medicine Team to assist in the care of this patient. Please  utilize secure chat with additional questions, if there is no response within 30 minutes please call the above phone number.  Palliative Medicine Team providers are available by phone from 7am to 7pm daily and can be reached through the team cell phone.  Should this patient require assistance outside of these hours, please call the patient's attending physician.   Billing based on time  Time Total: 25  Visit consisted of counseling and education dealing with the complex and emotionally intense issues of symptom management and palliative care in the setting of serious and potentially life-threatening illness. Greater than 50% of this time was spent counseling and coordinating care related to the above assessment and plan.  Personally spent 25 minutes in patient care including extensive chart review (labs, imaging, progress/consult notes, vital signs), medically appropraite exam, discussed with treatment team, education to patient, family, and staff, documenting clinical information, medication review and management, coordination of care, and available advanced directive documents.   "

## 2024-04-20 NOTE — Plan of Care (Signed)
  Problem: Education: Goal: Knowledge of General Education information will improve Description: Including pain rating scale, medication(s)/side effects and non-pharmacologic comfort measures Outcome: Progressing   Problem: Clinical Measurements: Goal: Respiratory complications will improve Outcome: Progressing   Problem: Clinical Measurements: Goal: Cardiovascular complication will be avoided Outcome: Progressing   Problem: Coping: Goal: Level of anxiety will decrease Outcome: Progressing   

## 2024-04-21 DIAGNOSIS — Z515 Encounter for palliative care: Secondary | ICD-10-CM

## 2024-04-21 DIAGNOSIS — Z7189 Other specified counseling: Secondary | ICD-10-CM

## 2024-04-21 DIAGNOSIS — I5023 Acute on chronic systolic (congestive) heart failure: Secondary | ICD-10-CM | POA: Diagnosis not present

## 2024-04-21 DIAGNOSIS — K223 Perforation of esophagus: Secondary | ICD-10-CM | POA: Diagnosis not present

## 2024-04-21 LAB — CBC
HCT: 23.7 % — ABNORMAL LOW (ref 39.0–52.0)
Hemoglobin: 7.7 g/dL — ABNORMAL LOW (ref 13.0–17.0)
MCH: 26.6 pg (ref 26.0–34.0)
MCHC: 32.5 g/dL (ref 30.0–36.0)
MCV: 81.7 fL (ref 80.0–100.0)
Platelets: 320 K/uL (ref 150–400)
RBC: 2.9 MIL/uL — ABNORMAL LOW (ref 4.22–5.81)
RDW: 18.2 % — ABNORMAL HIGH (ref 11.5–15.5)
WBC: 15.2 K/uL — ABNORMAL HIGH (ref 4.0–10.5)
nRBC: 0.1 % (ref 0.0–0.2)

## 2024-04-21 LAB — COMPREHENSIVE METABOLIC PANEL WITH GFR
ALT: 249 U/L — ABNORMAL HIGH (ref 0–44)
AST: 127 U/L — ABNORMAL HIGH (ref 15–41)
Albumin: 2.2 g/dL — ABNORMAL LOW (ref 3.5–5.0)
Alkaline Phosphatase: 1744 U/L — ABNORMAL HIGH (ref 38–126)
Anion gap: 11 (ref 5–15)
BUN: 63 mg/dL — ABNORMAL HIGH (ref 8–23)
CO2: 28 mmol/L (ref 22–32)
Calcium: 8.4 mg/dL — ABNORMAL LOW (ref 8.9–10.3)
Chloride: 94 mmol/L — ABNORMAL LOW (ref 98–111)
Creatinine, Ser: 3.29 mg/dL — ABNORMAL HIGH (ref 0.61–1.24)
GFR, Estimated: 19 mL/min — ABNORMAL LOW
Glucose, Bld: 98 mg/dL (ref 70–99)
Potassium: 4.2 mmol/L (ref 3.5–5.1)
Sodium: 133 mmol/L — ABNORMAL LOW (ref 135–145)
Total Bilirubin: 1.5 mg/dL — ABNORMAL HIGH (ref 0.0–1.2)
Total Protein: 5.3 g/dL — ABNORMAL LOW (ref 6.5–8.1)

## 2024-04-21 LAB — GLUCOSE, CAPILLARY
Glucose-Capillary: 100 mg/dL — ABNORMAL HIGH (ref 70–99)
Glucose-Capillary: 102 mg/dL — ABNORMAL HIGH (ref 70–99)
Glucose-Capillary: 113 mg/dL — ABNORMAL HIGH (ref 70–99)
Glucose-Capillary: 116 mg/dL — ABNORMAL HIGH (ref 70–99)
Glucose-Capillary: 135 mg/dL — ABNORMAL HIGH (ref 70–99)
Glucose-Capillary: 139 mg/dL — ABNORMAL HIGH (ref 70–99)

## 2024-04-21 LAB — HEPARIN LEVEL (UNFRACTIONATED)
Heparin Unfractionated: 0.64 [IU]/mL (ref 0.30–0.70)
Heparin Unfractionated: >1.1 [IU]/mL — ABNORMAL HIGH (ref 0.30–0.70)

## 2024-04-21 NOTE — Progress Notes (Signed)
 PHARMACY - ANTICOAGULATION CONSULT NOTE  Pharmacy Consult for Heparin  Indication: atrial fibrillation and DVT  Allergies[1]  Patient Measurements: Height: 6' 1 (185.4 cm) Weight: 74.8 kg (164 lb 14.5 oz) IBW/kg (Calculated) : 79.9 HEPARIN  DW (KG): 75.9  Vital Signs: Temp: 98.2 F (36.8 C) (01/04 1900) Temp Source: Oral (01/04 1900) BP: 145/73 (01/04 1900) Pulse Rate: 86 (01/04 1900)  Labs: Recent Labs    04/19/24 0500 04/19/24 1425 04/19/24 2009 04/19/24 2009 04/20/24 0607 04/21/24 0529 04/21/24 1800  HGB 8.7*  --   --   --  7.5* 7.7*  --   HCT 26.8*  --   --   --  23.1* 23.7*  --   PLT 258  --   --   --  298 320  --   APTT  --  34 80*  --   --   --   --   HEPARINUNFRC  --   --  0.31   < > 0.41 0.64 >1.10*  CREATININE 3.23*  --   --   --  2.59* 3.29*  --   CKTOTAL  --   --   --   --  35*  --   --    < > = values in this interval not displayed.    Estimated Creatinine Clearance: 21.8 mL/min (A) (by C-G formula based on SCr of 3.29 mg/dL (H)).  Assessment: Billy [name] is a 74 y.o. year old male admitted on 01/21/2024 with concern for esophageal rupture. On eliquis  prior to admission for afib and hx DVT (last dose pta 12/9 PM). Pharmacy consulted to dose heparin  for afib with no bolus.    04/22/23 PM update: heparin  level timed for this afternoon, resulted at > 1.1 after decrease from 1500u/hr >> 1350u/hr. Nursing stated that heparin  was not paused during draw. Ordered a re-collection at shift change. When night shift nurse went to draw heparin  level, noticed that heparin  infusion was empty and had been beeping for approximately 45 minutes.   Goal of Therapy:  Heparin  level 0.3-0.5 units/ml Monitor platelets by anticoagulation protocol: Yes   Plan:  Continue Heparin  drip 1350 uts/hr  Anti-Xa level in 8 hours - re-timed to 05:00 given pause.  Daily heparin  level,and CBC   Billy [name], PharmD, BCCCP  [facility] Please refer to [facility] for [facility], [facility], [facility]  numbers 04/21/2024 8:48 PM               [1]  Allergies Allergen Reactions   Hydralazine  Hcl     Concern for drug induced lupus   Norvasc  [Amlodipine ] Swelling and Other (See Comments)    Excessive BLE swelling Skin discoloration, blotching   Jardiance  [Empagliflozin ] Itching and Other (See Comments)    Patient mentioned penile swelling, pain   Morphine  And Codeine Itching    Opioid-induced pruritus

## 2024-04-21 NOTE — Progress Notes (Signed)
 PHARMACY - ANTICOAGULATION CONSULT NOTE  Pharmacy Consult for Heparin  Indication: atrial fibrillation and DVT  Allergies[1]  Patient Measurements: Height: 6' 1 (185.4 cm) Weight: 74.8 kg (164 lb 14.5 oz) IBW/kg (Calculated) : 79.9 HEPARIN  DW (KG): 75.9  Vital Signs: Temp: 97.9 F (36.6 C) (01/04 0730) Temp Source: Oral (01/04 0730) BP: 134/71 (01/04 0730) Pulse Rate: 77 (01/04 0730)  Labs: Recent Labs    04/19/24 0500 04/19/24 1425 04/19/24 2009 04/20/24 0607 04/21/24 0529  HGB 8.7*  --   --  7.5* 7.7*  HCT 26.8*  --   --  23.1* 23.7*  PLT 258  --   --  298 320  APTT  --  34 80*  --   --   HEPARINUNFRC  --   --  0.31 0.41 0.64  CREATININE 3.23*  --   --  2.59* 3.29*  CKTOTAL  --   --   --  35*  --     Estimated Creatinine Clearance: 21.8 mL/min (A) (by C-G formula based on SCr of 3.29 mg/dL (H)).  Assessment: Billy [name] is a 74 y.o. year old male admitted on 01/21/2024 with concern for esophageal rupture. On eliquis  prior to admission for afib and hx DVT (last dose pta 12/9 PM). Pharmacy consulted to dose heparin  for afib with no bolus.    04/22/23: Heparin  level supratherapeutic at 0.64 on 1500 units/hr. No issues with bleeding or pauses in drip per RN. CBC stable.  Goal of Therapy:  Heparin  level 0.3-0.5 units/ml Monitor platelets by anticoagulation protocol: Yes   Plan:  Decrease Heparin  drip 1350 uts/hr  Anti-Xa level in 8 hours Daily heparin  level,and CBC   R. Samual Satterfield, PharmD PGY-1 Acute Care Pharmacy Resident [facility] Please refer to [facility] for [facility] Pharmacy numbers 04/21/2024 7:51 AM              [1]  Allergies Allergen Reactions   Hydralazine  Hcl     Concern for drug induced lupus   Norvasc  [Amlodipine ] Swelling and Other (See Comments)    Excessive BLE swelling Skin discoloration, blotching   Jardiance  [Empagliflozin ] Itching and Other (See Comments)    Patient mentioned penile swelling, pain   Morphine  And Codeine  Itching    Opioid-induced pruritus

## 2024-04-21 NOTE — Progress Notes (Signed)
 [facility] Progress Note   Assessment/ Plan:   Dialysis dependent AKI on CKD 3b at baseline has L sided near occlusive renal artery s/p CRRT 12/10-12/16 ANA +, C4 low, + hematuria, anti-histone Ab +. 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  He has completed rounds of antibiotics.  Mild interstitial fibrosis/tubular atrophy.  Given concern for infection we provided steroids but avoided other IS medications due to weighing risks/benefits on pred taper now with plans to wean quickly given the main concern is infection as a cause he was on minoxidil  which caries similar risk for AI kidney phenomena as hydralazine . Stopped this (last dose 12/22)  Oliguric but no significant evidence of GFR recovery, continue dialysis Continue HD on MWF schedule: HD next Mon  Petechial rash;             - 2/2 Iga. S/p steroids, improved   Acute on chronic CHF exacerbation;             - HD and diuretics as aboven daily torsemide    H/o DVT and PE:             - Anticoagulation per cardiology and primary service   Boorehave's syndrome             - s/p repair             - Has a PEG  - EGD 03/19/24 with ulcers  - PPI and pepcid    HTN  - Carvedilol , followed BPs with [facility]  Dispo: Likely SNF - pending placement as of 1/2, To go to [facility] at  time of discharge   Subjective:   No major interval events, no complaints this morning Pall care c/s f/u yesterday - cont full scope but ongoing discussions reconvene Tues Oliguric UOP yest   Objective:   BP 134/71 (BP Location: Left Arm)   Pulse 77   Temp 97.9 F (36.6 C) (Oral)   Resp 20   Ht 6' 1 (1.854 m)   Wt 74.8 kg   SpO2 93%   BMI 21.76 kg/m   Intake/Output Summary (Last 24 hours) at 04/21/2024 1015 Last data filed at 04/20/2024 1711 Gross per 24 hour  Intake  387.84 ml  Output 225 ml  Net 162.84 ml    Weight change: -2.3 kg  Physical Exam: GEN: Lying in bed, calm PULM: normal WOB, clear ant CV: Normal rate, regular rhythm ABD: + PEG, mild distention EXT: trace edema NEURO: awake, alert, interactive Dialysis access: RIJ HD catheter  Imaging: NM Hepatobiliary Liver Func Result Date: 04/19/2024 EXAM: NM HEPATOBILLARY SCAN 04/19/2024 11:18:01 AM TECHNIQUE: RADIOPHARMACEUTICAL: 5.2 mCi Tc-39m mebrofenin  Dynamic images of the abdomen and pelvis were obtained in the anterior projection for 1 hour after intravenous administration of radiopharmaceutical. COMPARISON: Abdominal sonogram dated 04/16/2024. CLINICAL HISTORY: Cholecystitis. FINDINGS: Homogenous uptake within the liver. Normal clearance of the blood pool. Appropriate excretion into the biliary system. Activity is visualized in the gallbladder. IMPRESSION: 1. No scintigraphic evidence of acute cholecystitis. Electronically signed by: Evalene Coho MD 04/19/2024 12:28 PM EST RP Workstation: [id number]      Labs: BMET Recent Labs  Lab 04/15/24 9371 04/15/24 1150 04/16/24 0603 04/17/24 0416 04/18/24 0423 04/19/24 0500 04/20/24 0607 04/21/24 0529  NA 135 133* 135 133* 134* 134* 134* 133*  K 4.6 4.8 4.2 4.3 4.5 4.7 4.1 4.2  CL 94* 93* 96*  94* 95* 95* 95* 94*  CO2 28 28 30 28 30 29 31 28   GLUCOSE 188* 256* 159* 134* 148* 87 91 98  BUN 85* 91* 62* 85* 59* 77* 49* 63*  CREATININE 3.29* 3.49* 2.58* 3.20* 2.54* 3.23* 2.59* 3.29*  CALCIUM  7.8* 7.7* 7.8* 7.9* 7.9* 8.3* 8.1* 8.4*  PHOS 3.3  --   --   --   --   --   --   --    CBC Recent Labs  Lab 04/14/24 1455 04/15/24 1150 04/18/24 0423 04/19/24 0500 04/20/24 0607 04/21/24 0529  WBC 12.5*   < > 14.6* 15.3* 15.9* 15.2*  NEUTROABS 11.4*  --   --   --   --   --   HGB 9.8*   < > 7.6* 8.7* 7.5* 7.7*  HCT 29.9*   < > 23.2* 26.8* 23.1* 23.7*  MCV 84.2   < > 82.3 82.7 82.2 81.7  PLT 177   < > 238 258 298 320   < > = values in  this interval not displayed.    Medications:     carvedilol   3.125 mg Per Tube BID WC   Chlorhexidine  Gluconate Cloth  6 each Topical Daily   clonazePAM   0.5 mg Per Tube BID   famotidine   20 mg Per Tube Daily   feeding supplement (NEPRO CARB STEADY)  237 mL Oral BID BM   [name] butt cream   Topical BID   guaiFENesin   10 mL Per Tube Q4H   insulin  aspart  0-9 Units Subcutaneous Q4H   lidocaine   1 patch Transdermal Q24H   multivitamin  1 tablet Oral QHS   pantoprazole   40 mg Oral BID   predniSONE   10 mg Oral Q breakfast   saccharomyces boulardii  250 mg Per Tube BID   sodium chloride  flush  10-40 mL Intracatheter Q12H   torsemide   100 mg Oral Daily

## 2024-04-21 NOTE — Plan of Care (Signed)
   Problem: Education: Goal: Knowledge of General Education information will improve Description: Including pain rating scale, medication(s)/side effects and non-pharmacologic comfort measures Outcome: Progressing   Problem: Clinical Measurements: Goal: Respiratory complications will improve Outcome: Progressing Goal: Cardiovascular complication will be avoided Outcome: Progressing

## 2024-04-21 NOTE — Progress Notes (Signed)
 " PROGRESS NOTE    Billy [name]  FMW:[id number] DOB: 1951-03-06 DOA: 01/21/2024 PCP: Seabron Lenis, MD  Chronically ill 73/M prolonged hospitalization from 3/10-4/3 with cardiac arrest, A-fib, DVTs, hematochezia, IVC filter, chest tubes, eventually stabilized, discharged to rehab and then went home, readmitted 10/5 with large esophageal tear 10/7 esophageal stent placed by cardiothoracic 10/15 stent reposition 10/16 extubated 10/20 reintubated with ultrasound showing left-sided pleural effusion-left-sided chest tube placed-by lights criteria exudative effusion 10/23 echo 60-65% 10/24 barium swallow showed persistent leak 10/28 transferred out of ICU 11/3 PEG tube placed 11/19 swelling of right arm showing on ultrasound SVT cephalic vein 11/22 chest x-ray?  Pneumonia done because of back pain-follow-up CT scan shows stent in place no mediastinal fluid collection small bilateral pleural effusions with bibasilar atelectasis pneumonia not excluded-scattered clusters nodular densities predominantly right upper and right lower lobes with nodule 11 mm as previously seen?  Mets?  Infection?  Aspiration 11/26 stent removed. Esophagram negative for esophageal leak.  11/27 getting diuresis again 11/28 duodenitis/enteritis.  GI consulted. Bloody emesis added carafate   11/29 trialing clears 12/1 NV again w/ hematemesis. Lasix  held ---  esophagram performed showing patent esophagus without perforation irregularity mid esophagus?  Stent diffuse narrowing lumen distal duodenum and proximal jejunum 12/2 EGD ischemic ulcers. Impression:  - Congested, inflamed, nodular, texture changed  mucosa in the esophagus. - Gastritis, Non-bleeding duodenal ulcers with a clean ulcer  base (Forrest Class III).- Non-bleeding jejunal ulcers with a clean ulcer   base (Forrest Class III)  - Findings suggestive of ischemic ulcers. Increased carafate  and cont BID PPI  12/3 full liq diet  12/4 marked increased WOB. Acute and  progressive w/ increased hypoxia and HTN. 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 12/17 off clev transition to iHD, transfer to floor  12/18: TDC placed 12/19 [name] assumed care, has distended and painful abdomen, KUB shows improvement so tube feeds resumed, Eliquis  resumed 12/22 hemoccult neg---transfused 1 U--- Eliquis  had to be held in the setting 12/23 vascular consulted secondary to lower extremities----x-ray shows congestion no pneumonia white count up-- 12/24 ABIs showed abnormal right and left toe brachial indices-no intervention from vascular required 12/26 severe respiratory distress needing nonrebreather 12/29, EP consulted for recurrent ventricular tachycardia, recurrent A-fib RVR> Amio gtt. 12/30, started on mexiletine 12/31, LFTs trending up, amiodarone  and mexiletine discontinued,  1/1: LFTs continue to worsen, HIDA scan ordered, palliative care consulted 1/2: HIDA scan negative for cholecystitis, holding tube feeds which could be contributing to cholestasis Restarting IV heparin  for persistent A-fib       1/3: AST ALT improving, alk phos still significantly elevated      Subjective: - Feels fair, mild abdominal discomfort, having BMs, no nausea or vomiting, denies dyspnea  Assessment and Plan:  Acute kidney injury, new ESRD Volume overload -sp CRRT 12/10-12/16, TDC placed 12/18 - ANA positive, C4 low, antihistone antibody positive, noted to have hematuria as well  - Renal biopsy 12/16 noted 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 dominant  immune complex deposits, felt to be secondary to infection  - Completed course of antibiotics  - Currently on steroids per nephrology  -  sp HD yesterday, monitoring for renal recovery - Consulted palliative care with all above ongoing issues and high risk of decompensation, wishes for full code and full scope for now  Nonspecific leukocytosis -Afebrile, has completed multiple rounds of antibiotics - Ongoing prednisone  could be contributing, with abnormal LFTs and mild abdominal tenderness chronic cholecystitis was felt to be a possibility, RUQ US  q/ GB wall thickening,   - HIDA scan finally completed 1/2 morning, negative for acute cholecystitis  Boerhaave syndrome Recurrent upper GI bleed - 10/7 had esophageal stent placed by CT surgery - 10/15, esophageal stent repositioned - 11/26: Esophageal stent removed, no further leak -Ischemic ulcer disease--GI and cardiothoracic aware-several CTs since 11/25---CT scan done 12/21 unremarkable, no Carafate  with HD, stop Maalox -Last esophagram as above 12/1 -Continue Protonix , changed to p.o. - No further bleeding now, continue IV heparin , switch to oral anticoagulation in 1 to 2 days  Elevated LFT's - LFTs significantly elevated since 12/30, had not been checked for 20 days prior, could have been slowly trending up -On CT abd from 12/21 with mild biliary ductal dilation vs mild periportal edema and tiny layering stone in GB neck - Right upper quadrant ultrasound was nonspecific, ascites and gallbladder wall thickening, has mild epigastric and right sided tenderness on exam, HIDA scan negative for cholecystitis 1/2 - Held tube feeds in case that could be contributing to cholestasis - Amiodarone  discontinued 12/31, 1/1 mexiletine was discontinued due to predominant hepatic metabolism -stop lipitor  and Tylenol  - Suspect could have had a shock liver type injury BP dropped to 90s 12/28-29, versus passive hepatic congestion, LFTs slowly improving, alk  phos still significantly elevated however no obstructive pattern on imaging - Continue to trend  Severe Malnutrition-BMI 22 evidence of weight loss -S/p PEG -Continue dysphagia 3 diet , holding TFs temporarily, oral intake improving  Normocytic anemia secondary to peptic ulcer disease,?  Ongoing gastritis as above -Transfused PRBC x 1u in October, x1 unit 03/29/2024,12/23, 12/28  Cardiac arrest 06/2023 with HF MR EF 55-65% 03/27/2024  A-fib RVR CHADVASC >4 on Eliquis  [cardioverted 06/2023]--- recurrent RVR at times -Eliquis  has been held since 12/22  -cardiology following again for recurrent A-fib RVR -Now on low-dose Coreg , amiodarone  and mexiletine discontinued 12/31 with abnormal LFTs - We started IV heparin  1/2, switch to DOAC tomorrow if stable   DVT PE March 2025 -does have IVC filter in place since March Lower limb ischemia ruled out 12.23 ABI rules out ischemia-appreciate VVS--no intervention needed - Now back on anticoagulation with IV heparin , switch to DOAC soon  Insomnia - Continued on regimen of trazodone  Seroquel  and hydroxyzine   Diabetes - CBG stable  Multiple bedsores and wounds anterior right and left foot and injury stage I to bottom -Continue Gerhardt twice daily Lidoderm  patch for back pain  VT - recurrent runs of VT 12/28 - Cards following, amiodarone  and mexiletine discontinued with rising LFTs, continue low-dose Coreg , recommended Toprol  if he has recurrence   Resolved/stable problems Petechial rash Hydralazine  and Unasyn  have been stopped, cannot rule out HSP-this seems unlikely to me however he has mottling in general over his skin-see the pictures from 12/22 of his feet-seem stable to me Acute hypoxic respiratory failure//Aspiration Pneumonia//Pleural effusion//Acute on chronic diastolic CHF exacerbation       -respiratory failure due to pneumonia, pleural effusion, CHF exacerbation -- Better c Lasix  and antibiotics. Intemrittently seems to have  this Bilateral UE swelling--duplex upper extremities ruled out DVT Lung nodules-repeat CT in Jan/Feb window  DVT prophylaxis: IV heparin  Code Status: FUll Code Family Communication: none present Disposition Plan: SNF if he improves   Consultants:    Procedures:   Antimicrobials:    Objective: Vitals:   04/21/24 0730 04/21/24 0905 04/21/24 0907 04/21/24 0908  BP: 134/71     Pulse: 77     Resp: 20     Temp: 97.9 F (36.6 C)     TempSrc: Oral     SpO2: 97% (!) 85% (!) 88% 93%  Weight:      Height:        Intake/Output Summary (Last 24 hours) at 04/21/2024 1017 Last data filed at 04/20/2024 1711 Gross per 24 hour  Intake 387.84 ml  Output 225 ml  Net 162.84 ml   Filed Weights   04/19/24 1904 04/20/24 0416 04/21/24 0440  Weight: 75.1 kg 75.5 kg 74.8 kg    Examination:  General exam: Chronically ill-appearing AAO x 2, no distress HEENT: Positive JVD, right IJ HD catheter CVS: S1-S2, irregular rhythm Lungs: Poor air movement, decreased at the bases Abdomen: Mildly distended, soft, mild right-sided tenderness, PEG tube noted Extremities: 1+ edema Psychiatry: Flat affect    Data Reviewed:   CBC: Recent Labs  Lab 04/14/24 1455 04/15/24 1150 04/17/24 0416 04/18/24 0423 04/19/24 0500 04/20/24 0607 04/21/24 0529  WBC 12.5*   < > 15.2* 14.6* 15.3* 15.9* 15.2*  NEUTROABS 11.4*  --   --   --   --   --   --   HGB 9.8*   < > 7.7* 7.6* 8.7* 7.5* 7.7*  HCT 29.9*   < > 23.3* 23.2* 26.8* 23.1* 23.7*  MCV 84.2   < > 82.6 82.3 82.7 82.2 81.7  PLT 177   < > 222 238 258 298 320   < > = values in this interval not displayed.   Basic Metabolic Panel: Recent Labs  Lab 04/14/24 1455 04/15/24 0628 04/15/24 1004 04/15/24 1150 04/17/24 0416 04/18/24 0423 04/19/24 0500 04/20/24 0607 04/21/24 0529  NA 132* 135  --    < > 133* 134* 134* 134* 133*  K 4.7 4.6  --    < > 4.3 4.5 4.7 4.1 4.2  CL 94* 94*  --    < > 94* 95* 95* 95* 94*  CO2 28 28  --    < > 28 30  29 31 28   GLUCOSE 205* 188*  --    < > 134* 148* 87 91 98  BUN 77* 85*  --    < > 85* 59* 77* 49* 63*  CREATININE 3.13* 3.29*  --    < > 3.20* 2.54* 3.23* 2.59* 3.29*  CALCIUM  7.9* 7.8*  --    < > 7.9* 7.9* 8.3* 8.1* 8.4*  MG 2.0  --  2.2  --  2.3  --   --   --   --   PHOS  --  3.3  --   --   --   --   --   --   --    < > = values in this interval not displayed.   GFR: Estimated Creatinine Clearance: 21.8 mL/min (A) (by C-G formula based on SCr of 3.29 mg/dL (H)). Liver Function Tests: Recent Labs  Lab 04/17/24 0416 04/18/24 0423 04/19/24 0500 04/20/24 0607 04/21/24 0529  AST 251* 346* 193* 173* 127*  ALT 379* 492* 380* 299* 249*  ALKPHOS 1,332* 1,782* 1,547* 1,835* 1,744*  BILITOT 1.3* 1.9* 1.8* 2.3* 1.5*  PROT 4.9*  5.0* 5.1* 5.0* 5.3*  ALBUMIN  1.9* 1.9* 2.1* 1.9* 2.2*   No results for input(s): LIPASE, AMYLASE in the last 168 hours. No results for input(s): AMMONIA in the last 168 hours. Coagulation Profile: No results for input(s): INR, PROTIME in the last 168 hours. Cardiac Enzymes: Recent Labs  Lab 04/20/24 0607  CKTOTAL 35*   BNP (last 3 results) No results for input(s): PROBNP in the last 8760 hours. HbA1C: No results for input(s): HGBA1C in the last 72 hours. CBG: Recent Labs  Lab 04/20/24 1722 04/20/24 2140 04/21/24 0012 04/21/24 0419 04/21/24 0734  GLUCAP 119* 143* 116* 102* 100*   Lipid Profile: No results for input(s): CHOL, HDL, LDLCALC, TRIG, CHOLHDL, LDLDIRECT in the last 72 hours. Thyroid  Function Tests: No results for input(s): TSH, T4TOTAL, FREET4, T3FREE, THYROIDAB in the last 72 hours. Anemia Panel: No results for input(s): VITAMINB12, FOLATE, FERRITIN, TIBC, IRON , RETICCTPCT in the last 72 hours. Urine analysis:    Component Value Date/Time   COLORURINE YELLOW 03/27/2024 1122   APPEARANCEUR CLEAR 03/27/2024 1122   LABSPEC 1.025 03/27/2024 1122   PHURINE 5.5 03/27/2024 1122   GLUCOSEU  NEGATIVE 03/27/2024 1122   HGBUR LARGE (A) 03/27/2024 1122   BILIRUBINUR SMALL (A) 03/27/2024 1122   KETONESUR NEGATIVE 03/27/2024 1122   PROTEINUR >300 (A) 03/27/2024 1122   UROBILINOGEN 0.2 08/31/2019 1136   NITRITE NEGATIVE 03/27/2024 1122   LEUKOCYTESUR TRACE (A) 03/27/2024 1122   Sepsis Labs: @LABRCNTIP (procalcitonin:4,lacticidven:4)  )No results found for this or any previous visit (from the past 240 hours).   Radiology Studies: NM Hepatobiliary Liver Func Result Date: 04/19/2024 EXAM: NM HEPATOBILLARY SCAN 04/19/2024 11:18:01 AM TECHNIQUE: RADIOPHARMACEUTICAL: 5.2 mCi Tc-5m mebrofenin  Dynamic images of the abdomen and pelvis were obtained in the anterior projection for 1 hour after intravenous administration of radiopharmaceutical. COMPARISON: Abdominal sonogram dated 04/16/2024. CLINICAL HISTORY: Cholecystitis. FINDINGS: Homogenous uptake within the liver. Normal clearance of the blood pool. Appropriate excretion into the biliary system. Activity is visualized in the gallbladder. IMPRESSION: 1. No scintigraphic evidence of acute cholecystitis. Electronically signed by: Evalene Coho MD 04/19/2024 12:28 PM EST RP Workstation: [id number]     Scheduled Meds:  carvedilol   3.125 mg Per Tube BID WC   Chlorhexidine  Gluconate Cloth  6 each Topical Daily   clonazePAM   0.5 mg Per Tube BID   famotidine   20 mg Per Tube Daily   feeding supplement (NEPRO CARB STEADY)  237 mL Oral BID BM   [name] butt cream   Topical BID   guaiFENesin   10 mL Per Tube Q4H   insulin  aspart  0-9 Units Subcutaneous Q4H   lidocaine   1 patch Transdermal Q24H   multivitamin  1 tablet Oral QHS   pantoprazole   40 mg Oral BID   predniSONE   10 mg Oral Q breakfast   saccharomyces boulardii  250 mg Per Tube BID   sodium chloride  flush  10-40 mL Intracatheter Q12H   torsemide   100 mg Oral Daily   Continuous Infusions:  heparin  1,350 Units/hr (04/21/24 0859)   promethazine  (PHENERGAN ) injection (IM or IVPB)  12.5 mg (04/05/24 1153)     LOS: 91 days    Time spent:    Sigurd Pac, MD [facility]   04/21/2024, 10:17 AM    "

## 2024-04-22 DIAGNOSIS — I4819 Other persistent atrial fibrillation: Secondary | ICD-10-CM | POA: Diagnosis not present

## 2024-04-22 DIAGNOSIS — I472 Ventricular tachycardia, unspecified: Secondary | ICD-10-CM | POA: Diagnosis not present

## 2024-04-22 DIAGNOSIS — K223 Perforation of esophagus: Secondary | ICD-10-CM | POA: Diagnosis not present

## 2024-04-22 LAB — GLUCOSE, CAPILLARY
Glucose-Capillary: 130 mg/dL — ABNORMAL HIGH (ref 70–99)
Glucose-Capillary: 133 mg/dL — ABNORMAL HIGH (ref 70–99)
Glucose-Capillary: 80 mg/dL (ref 70–99)
Glucose-Capillary: 81 mg/dL (ref 70–99)
Glucose-Capillary: 87 mg/dL (ref 70–99)
Glucose-Capillary: 92 mg/dL (ref 70–99)

## 2024-04-22 LAB — COMPREHENSIVE METABOLIC PANEL WITH GFR
ALT: 203 U/L — ABNORMAL HIGH (ref 0–44)
AST: 103 U/L — ABNORMAL HIGH (ref 15–41)
Albumin: 2.2 g/dL — ABNORMAL LOW (ref 3.5–5.0)
Alkaline Phosphatase: 1695 U/L — ABNORMAL HIGH (ref 38–126)
Anion gap: 10 (ref 5–15)
BUN: 73 mg/dL — ABNORMAL HIGH (ref 8–23)
CO2: 29 mmol/L (ref 22–32)
Calcium: 8.4 mg/dL — ABNORMAL LOW (ref 8.9–10.3)
Chloride: 97 mmol/L — ABNORMAL LOW (ref 98–111)
Creatinine, Ser: 3.84 mg/dL — ABNORMAL HIGH (ref 0.61–1.24)
GFR, Estimated: 16 mL/min — ABNORMAL LOW
Glucose, Bld: 89 mg/dL (ref 70–99)
Potassium: 4.2 mmol/L (ref 3.5–5.1)
Sodium: 135 mmol/L (ref 135–145)
Total Bilirubin: 1.5 mg/dL — ABNORMAL HIGH (ref 0.0–1.2)
Total Protein: 5.4 g/dL — ABNORMAL LOW (ref 6.5–8.1)

## 2024-04-22 LAB — CBC
HCT: 24.2 % — ABNORMAL LOW (ref 39.0–52.0)
Hemoglobin: 7.8 g/dL — ABNORMAL LOW (ref 13.0–17.0)
MCH: 26.8 pg (ref 26.0–34.0)
MCHC: 32.2 g/dL (ref 30.0–36.0)
MCV: 83.2 fL (ref 80.0–100.0)
Platelets: 326 K/uL (ref 150–400)
RBC: 2.91 MIL/uL — ABNORMAL LOW (ref 4.22–5.81)
RDW: 18.6 % — ABNORMAL HIGH (ref 11.5–15.5)
WBC: 14.6 K/uL — ABNORMAL HIGH (ref 4.0–10.5)
nRBC: 0 % (ref 0.0–0.2)

## 2024-04-22 LAB — GAMMA GT: GGT: 1308 U/L — ABNORMAL HIGH (ref 7–50)

## 2024-04-22 LAB — HEPARIN LEVEL (UNFRACTIONATED)
Heparin Unfractionated: 0.26 [IU]/mL — ABNORMAL LOW (ref 0.30–0.70)
Heparin Unfractionated: 0.39 [IU]/mL (ref 0.30–0.70)

## 2024-04-22 MED ORDER — APIXABAN 5 MG PO TABS
5.0000 mg | ORAL_TABLET | Freq: Two times a day (BID) | ORAL | Status: DC
  Administered 2024-04-22 – 2024-04-26 (×9): 5 mg via ORAL
  Filled 2024-04-22 (×9): qty 1

## 2024-04-22 MED ORDER — HYDROMORPHONE HCL 1 MG/ML IJ SOLN
INTRAMUSCULAR | Status: AC
  Filled 2024-04-22: qty 0.5

## 2024-04-22 MED ORDER — HEPARIN SODIUM (PORCINE) 1000 UNIT/ML IJ SOLN
INTRAMUSCULAR | Status: AC
  Filled 2024-04-22: qty 4

## 2024-04-22 NOTE — Progress Notes (Signed)
 PT Cancellation Note  Patient Details Name: Billy [name] MRN: [id number] DOB: 04-19-1950   Cancelled Treatment:    Reason Eval/Treat Not Completed: Patient at procedure or test/unavailable (HD). Will check back as schedule allows.   Richerd Lipoma, PT  Acute Rehab Services Secure chat preferred Office [phone]    Richerd LITTIE Lipoma 04/22/2024, 8:19 AM

## 2024-04-22 NOTE — Progress Notes (Signed)
 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  He has completed rounds of antibiotics.  Mild interstitial fibrosis/tubular atrophy.  Given concern for infection we provided steroids but avoided other IS medications due to weighing risks/benefits on pred taper now with plans to wean quickly given the main concern is infection as a cause he was on minoxidil  which caries similar risk for AI kidney phenomena as hydralazine . Stopped this (last dose 12/22)  Oliguric but no significant evidence of GFR recovery, continue dialysis Continue HD on MWF schedule: HD next Mon (today)  Petechial rash;             - 2/2 Iga. S/p steroids, improved   Acute on chronic CHF exacerbation;             - HD and diuretics as aboven daily torsemide    H/o DVT and PE:             - Anticoagulation per cardiology and primary service   Boorehave's syndrome             - s/p repair             - Has a PEG  - EGD 03/19/24 with ulcers  - PPI and pepcid    HTN  - Carvedilol , followed BPs with UF  Elevated LFTs  - HIDA negative 04/19/24  - ALP out of proportion to AST/ ALT--> ? Bone turnover and was WNL until 03/27/24.  Note Ca not high  Dispo: Likely SNF - pending placement as of 1/2, To go to [facility] at  time of discharge   Subjective:    Some improvement in UOP yesterday.  Labs reviewed.  Alk phos significnatly elevated.     Objective:   BP (!) 140/64 (BP Location: Left Arm)   Pulse 84   Temp 97.9 F (36.6 C) (Oral)   Resp 20   Ht 6' 1 (1.854 m)   Wt 71.7 kg   SpO2 95%   BMI 20.85 kg/m   Intake/Output Summary (Last 24 hours)  at 04/22/2024 0958 Last data filed at 04/22/2024 0730 Gross per 24 hour  Intake 320 ml  Output 1300 ml  Net -980 ml    Weight change: -3.1 kg  Physical Exam: GEN: Lying in bed, calm PULM: normal WOB, clear ant CV: Normal rate, regular rhythm ABD: + PEG, mild distention EXT: trace edema NEURO: awake, alert, interactive Dialysis access: RIJ HD catheter  Imaging: No results found.     Labs: BMET Recent Labs  Lab 04/16/24 0603 04/17/24 0416 04/18/24 0423 04/19/24 0500 04/20/24 0607 04/21/24 0529 04/22/24 0436  NA 135 133* 134* 134* 134* 133* 135  K 4.2 4.3 4.5 4.7 4.1 4.2 4.2  CL 96* 94* 95* 95* 95* 94* 97*  CO2 30 28 30 29 31 28 29   GLUCOSE 159* 134* 148* 87 91 98 89  BUN 62* 85* 59* 77* 49* 63* 73*  CREATININE 2.58* 3.20* 2.54* 3.23* 2.59* 3.29* 3.84*  CALCIUM  7.8* 7.9* 7.9* 8.3* 8.1* 8.4* 8.4*   CBC Recent Labs  Lab 04/19/24 0500 04/20/24 0607 04/21/24 0529 04/22/24 0436  WBC 15.3* 15.9* 15.2* 14.6*  HGB 8.7* 7.5*  7.7* 7.8*  HCT 26.8* 23.1* 23.7* 24.2*  MCV 82.7 82.2 81.7 83.2  PLT 258 298 320 326    Medications:     carvedilol   3.125 mg Per Tube BID WC   Chlorhexidine  Gluconate Cloth  6 each Topical Daily   clonazePAM   0.5 mg Per Tube BID   famotidine   20 mg Per Tube Daily   feeding supplement (NEPRO CARB STEADY)  237 mL Oral BID BM   [name] butt cream   Topical BID   guaiFENesin   10 mL Per Tube Q4H   insulin  aspart  0-9 Units Subcutaneous Q4H   lidocaine   1 patch Transdermal Q24H   multivitamin  1 tablet Oral QHS   pantoprazole   40 mg Oral BID   predniSONE   10 mg Oral Q breakfast   saccharomyces boulardii  250 mg Per Tube BID   sodium chloride  flush  10-40 mL Intracatheter Q12H   torsemide   100 mg Oral Daily

## 2024-04-22 NOTE — Procedures (Signed)
 Patient seen and examined on Hemodialysis. The procedure was supervised and I have made appropriate changes. BP (!) 140/64 (BP Location: Left Arm)   Pulse 84   Temp 97.9 F (36.6 C) (Oral)   Resp 20   Ht 6' 1 (1.854 m)   Wt 71.7 kg   SpO2 95%   BMI 20.85 kg/m   QB 400 mL/ min via TDC, UF goal 2.5L  Tolerating treatment without complaints at this time.   Almarie Bonine MD [facility] [phone] 10:04 AM

## 2024-04-22 NOTE — Progress Notes (Signed)
 PHARMACY - ANTICOAGULATION CONSULT NOTE  Pharmacy Consult for heparin  Indication: Afib/DVT  Labs: Recent Labs    04/19/24 1425 04/19/24 2009 04/19/24 2009 04/20/24 0607 04/21/24 0529 04/21/24 1800 04/22/24 0436  HGB  --   --    < > 7.5* 7.7*  --  7.8*  HCT  --   --   --  23.1* 23.7*  --  24.2*  PLT  --   --   --  298 320  --  326  APTT 34 80*  --   --   --   --   --   HEPARINUNFRC  --  0.31   < > 0.41 0.64 >1.10* 0.26*  CREATININE  --   --   --  2.59* 3.29*  --   --   CKTOTAL  --   --   --  35*  --   --   --    < > = values in this interval not displayed.   Assessment: 74yo male now subtherapeutic on heparin  after rate change; no infusion issues or signs of bleeding per RN.  Goal of Therapy:  Heparin  level 0.3-0.5 units/ml   Plan:  Increase heparin  infusion by ~1 unit/kg/hr to 1450 units/hr. Check level in 8 hours.   Marvetta Dauphin, PharmD, BCPS 04/22/2024 5:20 AM

## 2024-04-22 NOTE — Progress Notes (Signed)
 Nutrition Brief Note  Patient off unit in HD.  Spoke with RN who reports pt ate about 60% of breakfast this morning.   Yesterday meal completions documented to be 0% breakfast, 20% lunch and RN reports he did not want dinner but drank some milk.   Given current information, would recommend continuing with nutrition support via PEG, at minimum nocturnal tube feeds.  Will initiate calorie count to objectively assess patients nutritional adequacy. Discussed with RN and NT.  Allie Vinicio Lynk, RDN, LDN Clinical Nutrition See AMiON for contact information.

## 2024-04-22 NOTE — Progress Notes (Addendum)
 " PROGRESS NOTE    Billy [name]  FMW:[id number] DOB: Sep 19, 1950 DOA: 01/21/2024 PCP: Seabron Lenis, MD  Chronically ill 73/M prolonged hospitalization from 3/10-4/3 with cardiac arrest, A-fib, DVTs, hematochezia, IVC filter, chest tubes, eventually stabilized, discharged to rehab and then went home, readmitted 10/5 with large esophageal tear 10/7 esophageal stent placed by cardiothoracic 10/15 stent reposition 10/16 extubated 10/20 reintubated with ultrasound showing left-sided pleural effusion-left-sided chest tube placed-by lights criteria exudative effusion 10/23 echo 60-65% 10/24 barium swallow showed persistent leak 10/28 transferred out of ICU 11/3 PEG tube placed 11/19 swelling of right arm showing on ultrasound SVT cephalic vein 11/22 chest x-ray?  Pneumonia done because of back pain-follow-up CT scan shows stent in place no mediastinal fluid collection small bilateral pleural effusions with bibasilar atelectasis pneumonia not excluded-scattered clusters nodular densities predominantly right upper and right lower lobes with nodule 11 mm as previously seen?  Mets?  Infection?  Aspiration 11/26 stent removed. Esophagram negative for esophageal leak.  11/27 getting diuresis again 11/28 duodenitis/enteritis.  GI consulted. Bloody emesis added carafate   11/29 trialing clears 12/1 NV again w/ hematemesis. Lasix  held ---  esophagram performed showing patent esophagus without perforation irregularity mid esophagus?  Stent diffuse narrowing lumen distal duodenum and proximal jejunum 12/2 EGD ischemic ulcers. Impression:  - Congested, inflamed, nodular, texture changed  mucosa in the esophagus. - Gastritis, Non-bleeding duodenal ulcers with a clean ulcer  base (Forrest Class III).- Non-bleeding jejunal ulcers with a clean ulcer   base (Forrest Class III)  - Findings suggestive of ischemic ulcers. Increased carafate  and cont BID PPI  12/3 full liq diet  12/4 marked increased WOB. Acute and  progressive w/ increased hypoxia and HTN. 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 12/17 off clev transition to iHD, transfer to floor  12/18: TDC placed 12/19 [name] assumed care, has distended and painful abdomen, KUB shows improvement so tube feeds resumed, Eliquis  resumed 12/22 hemoccult neg---transfused 1 U--- Eliquis  had to be held in the setting 12/23 vascular consulted secondary to lower extremities----x-ray shows congestion no pneumonia white count up-- 12/24 ABIs showed abnormal right and left toe brachial indices-no intervention from vascular required 12/26 severe respiratory distress needing nonrebreather 12/29, EP consulted for recurrent ventricular tachycardia, recurrent A-fib RVR> Amio gtt. 12/30, started on mexiletine 12/31, LFTs trending up, amiodarone  and mexiletine discontinued,  1/1: LFTs continue to worsen, HIDA scan ordered, palliative care consulted 1/2: HIDA scan negative for cholecystitis, holding tube feeds which could be contributing to cholestasis Restarting IV heparin  for persistent A-fib       1/3: AST ALT improving, alk phos still significantly elevated 1/5: switched IV Heparin  to Eliquis      Subjective: - Just back from dialysis, eating lunch -steak, potatoes and broccoli  Assessment and Plan:  Acute kidney injury, new ESRD Volume overload -sp CRRT 12/10-12/16, TDC placed 12/18 - ANA positive, C4 low, antihistone antibody positive, noted to have hematuria as well  - Renal biopsy 12/16 noted diffuse proliferative and exudative glomerulonephritis with  IgA dominant immune complex deposits, felt to be secondary to infection  - Completed course of antibiotics  - Currently on steroids per  nephrology  - sp HD today, monitoring for renal recovery, making urine but no signif recovery yet - Consulted palliative care with all above ongoing issues and high risk of decompensation, wishes for full code and full scope for now -CLIP completed to start Outpt HD on 1/9  Nonspecific leukocytosis -Afebrile, has completed multiple rounds of antibiotics - Ongoing prednisone  could be contributing,  - with abnormal LFTs and mild abdominal tenderness chronic cholecystitis was felt to be a possibility, RUQ US  q/ GB wall thickening, HIDA scan finally completed 1/2 morning, negative for acute cholecystitis  Boerhaave syndrome Recurrent upper GI bleed - 10/7 had esophageal stent placed by CT surgery - 10/15, esophageal stent repositioned - 11/26: Esophageal stent removed, no further leak -Ischemic ulcer disease--GI and cardiothoracic aware-several CTs since 11/25---CT scan done 12/21 unremarkable, no Carafate  with HD, stop Maalox -Last esophagram as above 12/1 -Continue Protonix , changed to p.o. - No further bleeding now, continue IV heparin , switch to oral Eliquis  today  Elevated LFT's - LFTs significantly elevated since 12/30, had not been checked for 20 days prior, could have been slowly trending up -On CT abd from 12/21 with mild biliary ductal dilation vs mild periportal edema and tiny layering stone in GB neck - Right upper quadrant ultrasound was nonspecific, ascites and gallbladder wall thickening, has mild epigastric and right sided tenderness on exam, HIDA scan negative for cholecystitis 1/2 - Held tube feeds in case that could be contributing to cholestasis - Amiodarone  discontinued 12/31, 1/1 mexiletine was discontinued due to predominant hepatic metabolism -stop lipitor  and Tylenol  - Suspect could have had a shock liver type injury BP dropped to  90s 12/28-29, versus passive hepatic congestion, and meds this admission including amio/abx etc -LFTs slowly improving, alk phos still significantly elevated however no obstructive pattern on imaging, GGT is elevated suggesting hepatic source -d/w GI today suggested trending labs and also checking autoimmune hep panel, ACute Hep panel, and considering MRCP  Severe Malnutrition-BMI 22 evidence of weight loss -S/p PEG -Continue dysphagia 3 diet , holding TFs temporarily, oral intake improving  Normocytic anemia secondary to peptic ulcer disease,?  Ongoing gastritis as above -Transfused PRBC x 1u in October, x1 unit 03/29/2024,12/23, 12/28  Cardiac arrest 06/2023 with HF MR EF 55-65% 03/27/2024  A-fib RVR CHADVASC >4 on Eliquis  [cardioverted 06/2023]--- recurrent RVR at times -Eliquis  has been held since 12/22  -cardiology following again for recurrent A-fib RVR -Now on low-dose Coreg , amiodarone  and mexiletine discontinued 12/31 with abnormal LFTs - We started IV heparin  1/2, switch to DOAC today   DVT PE March 2025 -does have IVC filter in place since March Lower limb ischemia ruled out 12.23 ABI rules out ischemia-appreciate VVS--no intervention needed - Now back on anticoagulation with IV heparin , switch to DOAC soon  Insomnia - Continued on regimen of trazodone  Seroquel  and hydroxyzine   Diabetes - CBG stable  Multiple bedsores and wounds anterior right and left foot and injury stage I to bottom -Continue Gerhardt twice daily Lidoderm  patch for back pain  VT - recurrent runs of VT 12/28 - Cards following, amiodarone  and mexiletine discontinued with rising LFTs, continue low-dose Coreg , recommended Toprol  if he has recurrence   Resolved/stable problems Petechial rash Hydralazine  and Unasyn  have been stopped, cannot rule out HSP-this seems unlikely to me however he has mottling in general over his skin-see the pictures from 12/22 of his feet-seem stable to me Acute hypoxic  respiratory failure//Aspiration Pneumonia//Pleural effusion//Acute on chronic diastolic CHF exacerbation       -  respiratory failure due to pneumonia, pleural effusion, CHF exacerbation -- Better c Lasix  and antibiotics. Intemrittently seems to have this Bilateral UE swelling--duplex upper extremities ruled out DVT Lung nodules-repeat CT in Jan/Feb window       DVT prophylaxis: IV heparin  Code Status: FUll Code Family Communication: none present Disposition Plan: SNF if he improves   Consultants:    Procedures:   Antimicrobials:    Objective: Vitals:   04/22/24 1130 04/22/24 1200 04/22/24 1212 04/22/24 1220  BP: 127/69 (!) 148/55 (!) 148/89 (!) 141/67  Pulse: 98 99 93 80  Resp: (!) 26 (!) 26 (!) 23 (!) 21  Temp:    98.3 F (36.8 C)  TempSrc:    Oral  SpO2: 97% 94% 99% 93%  Weight:      Height:        Intake/Output Summary (Last 24 hours) at 04/22/2024 1247 Last data filed at 04/22/2024 1220 Gross per 24 hour  Intake 320 ml  Output 3300 ml  Net -2980 ml   Filed Weights   04/21/24 0440 04/22/24 0300 04/22/24 0824  Weight: 74.8 kg 71.7 kg 71.7 kg    Examination:  General exam: Chronically ill-appearing, AAO x 2, no distress HEENT: Positive JVD, right IJ HD catheter CVS: S1-S2, irregular rhythm Lungs: Improving air movement, decreased at the bases  Abdomen: Mildly distended, soft, mild right-sided tenderness, PEG tube noted Extremities: 1+ edema Psychiatry: Flat affect    Data Reviewed:   CBC: Recent Labs  Lab 04/18/24 0423 04/19/24 0500 04/20/24 0607 04/21/24 0529 04/22/24 0436  WBC 14.6* 15.3* 15.9* 15.2* 14.6*  HGB 7.6* 8.7* 7.5* 7.7* 7.8*  HCT 23.2* 26.8* 23.1* 23.7* 24.2*  MCV 82.3 82.7 82.2 81.7 83.2  PLT 238 258 298 320 326   Basic Metabolic Panel: Recent Labs  Lab 04/17/24 0416 04/18/24 0423 04/19/24 0500 04/20/24 0607 04/21/24 0529 04/22/24 0436  NA 133* 134* 134* 134* 133* 135  K 4.3 4.5 4.7 4.1 4.2 4.2  CL 94* 95* 95* 95* 94* 97*   CO2 28 30 29 31 28 29   GLUCOSE 134* 148* 87 91 98 89  BUN 85* 59* 77* 49* 63* 73*  CREATININE 3.20* 2.54* 3.23* 2.59* 3.29* 3.84*  CALCIUM  7.9* 7.9* 8.3* 8.1* 8.4* 8.4*  MG 2.3  --   --   --   --   --    GFR: Estimated Creatinine Clearance: 17.9 mL/min (A) (by C-G formula based on SCr of 3.84 mg/dL (H)). Liver Function Tests: Recent Labs  Lab 04/18/24 0423 04/19/24 0500 04/20/24 0607 04/21/24 0529 04/22/24 0436  AST 346* 193* 173* 127* 103*  ALT 492* 380* 299* 249* 203*  ALKPHOS 1,782* 1,547* 1,835* 1,744* 1,695*  BILITOT 1.9* 1.8* 2.3* 1.5* 1.5*  PROT 5.0* 5.1* 5.0* 5.3* 5.4*  ALBUMIN  1.9* 2.1* 1.9* 2.2* 2.2*   No results for input(s): LIPASE, AMYLASE in the last 168 hours. No results for input(s): AMMONIA in the last 168 hours. Coagulation Profile: No results for input(s): INR, PROTIME in the last 168 hours. Cardiac Enzymes: Recent Labs  Lab 04/20/24 0607  CKTOTAL 35*   BNP (last 3 results) No results for input(s): PROBNP in the last 8760 hours. HbA1C: No results for input(s): HGBA1C in the last 72 hours. CBG: Recent Labs  Lab 04/21/24 1634 04/21/24 2017 04/22/24 0031 04/22/24 0435 04/22/24 0720  GLUCAP 139* 113* 81 87 92   Lipid Profile: No results for input(s): CHOL, HDL, LDLCALC, TRIG, CHOLHDL, LDLDIRECT in the last 72 hours. Thyroid  Function  Tests: No results for input(s): TSH, T4TOTAL, FREET4, T3FREE, THYROIDAB in the last 72 hours. Anemia Panel: No results for input(s): VITAMINB12, FOLATE, FERRITIN, TIBC, IRON , RETICCTPCT in the last 72 hours. Urine analysis:    Component Value Date/Time   COLORURINE YELLOW 03/27/2024 1122   APPEARANCEUR CLEAR 03/27/2024 1122   LABSPEC 1.025 03/27/2024 1122   PHURINE 5.5 03/27/2024 1122   GLUCOSEU NEGATIVE 03/27/2024 1122   HGBUR LARGE (A) 03/27/2024 1122   BILIRUBINUR SMALL (A) 03/27/2024 1122   KETONESUR NEGATIVE 03/27/2024 1122   PROTEINUR >300 (A) 03/27/2024  1122   UROBILINOGEN 0.2 08/31/2019 1136   NITRITE NEGATIVE 03/27/2024 1122   LEUKOCYTESUR TRACE (A) 03/27/2024 1122   Sepsis Labs: @LABRCNTIP (procalcitonin:4,lacticidven:4)  )No results found for this or any previous visit (from the past 240 hours).   Radiology Studies: No results found.    Scheduled Meds:  carvedilol   3.125 mg Per Tube BID WC   Chlorhexidine  Gluconate Cloth  6 each Topical Daily   clonazePAM   0.5 mg Per Tube BID   famotidine   20 mg Per Tube Daily   feeding supplement (NEPRO CARB STEADY)  237 mL Oral BID BM   [name] butt cream   Topical BID   guaiFENesin   10 mL Per Tube Q4H   insulin  aspart  0-9 Units Subcutaneous Q4H   lidocaine   1 patch Transdermal Q24H   multivitamin  1 tablet Oral QHS   pantoprazole   40 mg Oral BID   predniSONE   10 mg Oral Q breakfast   saccharomyces boulardii  250 mg Per Tube BID   sodium chloride  flush  10-40 mL Intracatheter Q12H   torsemide   100 mg Oral Daily   Continuous Infusions:  heparin  1,450 Units/hr (04/22/24 0550)   promethazine  (PHENERGAN ) injection (IM or IVPB) 12.5 mg (04/05/24 1153)     LOS: 92 days    Time spent:    Sigurd Pac, MD [facility]   04/22/2024, 12:47 PM    "

## 2024-04-22 NOTE — Progress Notes (Signed)
" °   04/22/24 1220  Vitals  Temp 98.3 F (36.8 C)  Temp Source Oral  BP (!) 141/67  MAP (mmHg) 83  BP Location Left Arm  BP Method Automatic  Patient Position (if appropriate) Lying  Pulse Rate 80  Pulse Rate Source Monitor  ECG Heart Rate 86  Resp (!) 21  Oxygen  Therapy  SpO2 93 %  O2 Device Nasal Cannula  O2 Flow Rate (L/min) 2 L/min  During Treatment Monitoring  Blood Flow Rate (mL/min) 0 mL/min  Arterial Pressure (mmHg) -2.02 mmHg  Venous Pressure (mmHg) 2.42 mmHg  TMP (mmHg) -47.88 mmHg  Ultrafiltration Rate (mL/min) 730 mL/min  Dialysate Flow Rate (mL/min) 299 ml/min  Duration of HD Treatment -hour(s) 3.5 hour(s)  Cumulative Fluid Removed (mL) per Treatment  2000.2  Post Treatment  Dialyzer Clearance Clear  Hemodialysis Intake (mL) 0 mL  Liters Processed 84  Fluid Removed (mL) 2000 mL  Tolerated HD Treatment Yes  Post-Hemodialysis Comments tolerated well goal met  Note  Patient Observations alert no c/o  Hemodialysis Catheter Right Internal jugular Double lumen Permanent (Tunneled)  Placement Date/Time: 04/04/24 1605   Serial / Lot #: [id number]  Expiration Date: 12/16/28  Time Out: Correct patient;Correct site;Correct procedure  Maximum sterile barrier precautions: Hand hygiene;Cap;Mask;Sterile gown;Sterile gloves;Large sterile s...  Site Condition No complications  Blue Lumen Status Flushed;Heparin  locked  Red Lumen Status Flushed;Heparin  locked  Purple Lumen Status N/A  Catheter fill solution Heparin  1000 units/ml  Catheter fill volume (Arterial) 1.9 cc  Catheter fill volume (Venous) 1.9  Dressing Type Transparent  Dressing Status Antimicrobial disc/dressing in place  Drainage Description None  Post treatment catheter status Capped and Clamped   Received patient in bed to unit.  Alert and oriented.  Informed consent signed and in chart.   TX duration:  Patient tolerated well.  Transported back to the room  Alert, without acute distress.  Hand-off given  to patient's nurse.   Access used: Rt HD cathetter Access issues: none  Total UF removed: 2000 Medication(s) given: dilaudid  0.5mg  Post HD VS: see above Post HD weight: n/a   Docia CHRISTELLA [facility] "

## 2024-04-22 NOTE — Plan of Care (Signed)
   Problem: Education: Goal: Knowledge of General Education information will improve Description: Including pain rating scale, medication(s)/side effects and non-pharmacologic comfort measures Outcome: Progressing   Problem: Health Behavior/Discharge Planning: Goal: Ability to manage health-related needs will improve Outcome: Progressing   Problem: Clinical Measurements: Goal: Will remain free from infection Outcome: Progressing

## 2024-04-22 NOTE — TOC Progression Note (Signed)
 Transition of Care [facility]) - Progression Note    Patient Details  Name: Manoj Enriquez MRN: [id number] Date of Birth: 1950/11/12  [facility] CM/SW Contact  [name], [place] Phone Number: 04/22/2024, 11:58 AM  Clinical Narrative:   Per MD, patient improving. Awaiting further GOC discussion with Palliative on 1/6. CSW following for medical readiness.   CSW will continue to follow.    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, SNF Pending bed offer               Expected Discharge Plan and Services In-house Referral: Clinical Social Work Discharge Planning Services: [employer] Consult Post Acute Care Choice: Skilled Nursing Facility Living arrangements for the past 2 months: Single Family Home                                       Social Drivers of Health (SDOH) Interventions SDOH Screenings   Food Insecurity: No Food Insecurity (01/22/2024)  Housing: High Risk (01/22/2024)  Transportation Needs: No Transportation Needs (01/22/2024)  Utilities: Not At Risk (01/22/2024)  Social Connections: Socially Isolated (01/22/2024)  Tobacco Use: Low Risk (03/19/2024)    Readmission Risk Interventions    06/27/2023    1:52 PM 10/14/2022   11:29 AM  Readmission Risk Prevention Plan  Post Dischage Appt  Complete  Medication Screening  Complete  Transportation Screening Complete Complete  HRI or Home Care Consult Complete   Social Work Consult for Recovery Care Planning/Counseling Complete   Palliative Care Screening Not Applicable   Medication Review Oceanographer) Referral to Pharmacy

## 2024-04-22 NOTE — Progress Notes (Signed)
 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    EP saw this admission for slow MMVT, AF/AFL.   Plan:  -continue current coreg  dosing  -if recurrent arrhythmia, switch Coreg  to Toprol -XL  -unclear if amiodarone  was culprit for elevated LFT's   Please call EP back if new arrhythmia occurs / new needs arise.     Daphne Barrack, NP-C, AGACNP-BC [facility] - Electrophysiology  04/22/2024, 8:30 AM

## 2024-04-22 NOTE — Progress Notes (Signed)
 Physical Therapy Treatment Patient Details Name: Billy [name] MRN: [id number] DOB: [birth year] Today's Date: 04/22/2024   History of Present Illness Billy [name] is a 74 y.o. male who presented to [facility] ED 01/21/24 with several days of vomiting followed by an episode of severe tearing epigastric pain. Work-up revealed esophageal tear. Pt s/p esophageal stent with EGD 10/7. Attempted stent repositioning on 10/15 and was hypoxic, was intubated.  Reintubated 10/20-10/24. G tube placed 11/3. He accidentally pulled out the R chest tube on 11/7.L chest tube removed 11/11. Found to have superficial venous thrombosis of RUE 11/19. N&V with epigastric pain 11/28, found severe duodenitis on repeat CT. S/P EGD on 12/2. 12/10 re-intubated due to respiratory distress. CRRT 12/10-12/16. Extubated 12/12. 12/26 severe respiratory distress needing nonrebreather, volume overloaded, required emergent HD. Pending US  abdomen. PMHx: HFrEF, AFib, CAD, PE complicated by cardiogenic shock, CKD 4, MI, peripheral neuropathy, and TIA.    PT Comments  Pt is slowly progressing towards goals. Pt with fluctuations in physical status due to medical complications. Pt has a high fear of falling and anxiety surrounding mobility frequently stating I can't do it and asking for help prior to attempting an activity. Pt was Min A to Min A +2 for bed mobility, Mod to Min A +2 for sit to stand and scooting up EOB. Due to pt current functional status, home set up and available assistance at home recommending skilled physical therapy services on discharge from acute hospital setting in order to address strength, balance and functional mobility to decrease risk for falls, injury, immobility, skin break down and re-hospitalization.     If plan is discharge home, recommend the following: Two people to help with walking and/or transfers;Two people to help with bathing/dressing/bathroom;Assistance with cooking/housework;Assistance with feeding;Direct  supervision/assist for medications management;Direct supervision/assist for financial management;Assist for transportation;Help with stairs or ramp for entrance   Can travel by private vehicle        Equipment Recommendations  Rolling walker (2 wheels);BSC/3in1;Wheelchair (measurements PT);Wheelchair cushion (measurements PT);Hospital bed       Precautions / Restrictions Precautions Precautions: Fall Recall of Precautions/Restrictions: Intact Precaution/Restrictions Comments: PEG Restrictions Weight Bearing Restrictions Per Provider Order: No     Mobility  Bed Mobility Overal bed mobility: Needs Assistance Bed Mobility: Rolling, Sidelying to Sit, Sit to Sidelying Rolling: Min assist, Used rails Sidelying to sit: Min assist, +2 for physical assistance     Sit to sidelying: +2 for physical assistance, Min assist General bed mobility comments: Pt performed bed mobility to R side of bed. Pt requires Min A for LE and Trunk to midline for sitting and Min A with Le to get back to supine. Pt requires heavy encouragement to perform activities without heavy physical assist and to attempt to perform independently with increased time to give pt space to attempt without as much physical assist    Transfers Overall transfer level: Needs assistance Equipment used: Rolling walker (2 wheels) Transfers: Sit to/from Stand Sit to Stand: Mod assist, Min assist, +2 physical assistance           General transfer comment: initially pt was MOd A +2 for sit to stand from EOB for initial momentum due to pt coming about halfway up and then stating he can't do it. For second attempt pt was Min A to get fully to standing. pt standing briefly less than one min for each trial reporting he needs to sit. O2 sats remained in the 90's and HR in the 70;s- 80s throughout session. Pt  with significant increase in respirations at the thought of mobility that would wax and wan    Ambulation/Gait     General Gait  Details: unable at this time due to fatigue/anxiety     Balance Overall balance assessment: Needs assistance Sitting-balance support: Bilateral upper extremity supported, Feet supported Sitting balance-Leahy Scale: Fair Sitting balance - Comments: close SBA due to anxiety from pt and high fear of falling.   Standing balance support: Bilateral upper extremity supported, During functional activity, Reliant on assistive device for balance Standing balance-Leahy Scale: Poor Standing balance comment: Pt dependent on RW        Communication Communication Communication: Impaired Factors Affecting Communication: Hearing impaired  Cognition Arousal: Alert Behavior During Therapy: Anxious, Flat affect   PT - Cognitive impairments: Attention, Sequencing, Problem solving     PT - Cognition Comments: Pt very nervous to mobilize. He perseverated on I can't do it.  Provided reassurance and encouraged pt to attempt without assist for PT/Mobility Specialist. Following commands: Intact Following commands impaired: Follows one step commands with increased time, Only follows one step commands consistently    Cueing Cueing Techniques: Verbal cues, Tactile cues, Gestural cues     General Comments General comments (skin integrity, edema, etc.): O2 sats remained in the 90's and HR in 70's/80s throughout session on 2L O2 via [place]      Pertinent Vitals/Pain Pain Assessment Pain Assessment: Faces Faces Pain Scale: Hurts little more Breathing: normal Negative Vocalization: occasional moan/groan, low speech, negative/disapproving quality Facial Expression: sad, frightened, frown Body Language: tense, distressed pacing, fidgeting Consolability: no need to console PAINAD Score: 3 Pain Location: all over- generalized Pain Descriptors / Indicators: Discomfort, Aching, Sore Pain Intervention(s): Monitored during session, Limited activity within patient's tolerance     PT Goals (current goals can now  be found in the care plan section) Acute Rehab PT Goals Patient Stated Goal: Feel better PT Goal Formulation: With patient Time For Goal Achievement: 04/30/24 Potential to Achieve Goals: Fair Progress towards PT goals: Progressing toward goals    Frequency    Min 2X/week      PT Plan  Continue with current POC        AM-PAC PT 6 Clicks Mobility   Outcome Measure  Help needed turning from your back to your side while in a flat bed without using bedrails?: A Little Help needed moving from lying on your back to sitting on the side of a flat bed without using bedrails?: A Lot Help needed moving to and from a bed to a chair (including a wheelchair)?: A Lot Help needed standing up from a chair using your arms (e.g., wheelchair or bedside chair)?: A Lot Help needed to walk in hospital room?: Total Help needed climbing 3-5 steps with a railing? : Total 6 Click Score: 11    End of Session Equipment Utilized During Treatment: Gait belt (above PEG tube) Activity Tolerance: Patient tolerated treatment well Patient left: in bed;with call bell/phone within reach;with bed alarm set Nurse Communication: Mobility status PT Visit Diagnosis: Other abnormalities of gait and mobility (R26.89);Muscle weakness (generalized) (M62.81);Other symptoms and signs involving the nervous system (R29.898);History of falling (Z91.81)     Time: 8399-8374 PT Time Calculation (min) (ACUTE ONLY): 25 min  Charges:    $Therapeutic Activity: 23-37 mins PT General Charges $$ ACUTE PT VISIT: 1 Visit                     Dorothyann Maier, DPT, CLT  Acute  Rehabilitation Services Office: [phone] (Secure chat preferred)    Dorothyann VEAR Maier 04/22/2024, 4:54 PM

## 2024-04-22 NOTE — Progress Notes (Signed)
 Patient went to Dialysis in Early AM ~0730. Returning from dialysis now 1227. Patient got 2 L removed. Normal schedule dialysis MWF.

## 2024-04-23 DIAGNOSIS — I4819 Other persistent atrial fibrillation: Secondary | ICD-10-CM | POA: Diagnosis not present

## 2024-04-23 DIAGNOSIS — I472 Ventricular tachycardia, unspecified: Secondary | ICD-10-CM | POA: Diagnosis not present

## 2024-04-23 DIAGNOSIS — Z515 Encounter for palliative care: Secondary | ICD-10-CM | POA: Diagnosis not present

## 2024-04-23 DIAGNOSIS — E43 Unspecified severe protein-calorie malnutrition: Secondary | ICD-10-CM | POA: Diagnosis not present

## 2024-04-23 LAB — GLUCOSE, CAPILLARY
Glucose-Capillary: 97 mg/dL (ref 70–99)
Glucose-Capillary: 87 mg/dL (ref 70–99)
Glucose-Capillary: 144 mg/dL — ABNORMAL HIGH (ref 70–99)
Glucose-Capillary: 104 mg/dL — ABNORMAL HIGH (ref 70–99)
Glucose-Capillary: 139 mg/dL — ABNORMAL HIGH (ref 70–99)
Glucose-Capillary: 137 mg/dL — ABNORMAL HIGH (ref 70–99)

## 2024-04-23 LAB — COMPREHENSIVE METABOLIC PANEL WITH GFR
ALT: 166 U/L — ABNORMAL HIGH (ref 0–44)
AST: 93 U/L — ABNORMAL HIGH (ref 15–41)
Albumin: 2.1 g/dL — ABNORMAL LOW (ref 3.5–5.0)
Alkaline Phosphatase: 1549 U/L — ABNORMAL HIGH (ref 38–126)
Anion gap: 9 (ref 5–15)
BUN: 42 mg/dL — ABNORMAL HIGH (ref 8–23)
CO2: 30 mmol/L (ref 22–32)
Calcium: 8.4 mg/dL — ABNORMAL LOW (ref 8.9–10.3)
Chloride: 97 mmol/L — ABNORMAL LOW (ref 98–111)
Creatinine, Ser: 2.58 mg/dL — ABNORMAL HIGH (ref 0.61–1.24)
GFR, Estimated: 26 mL/min — ABNORMAL LOW
Glucose, Bld: 93 mg/dL (ref 70–99)
Potassium: 4.2 mmol/L (ref 3.5–5.1)
Sodium: 135 mmol/L (ref 135–145)
Total Bilirubin: 1.2 mg/dL (ref 0.0–1.2)
Total Protein: 5.5 g/dL — ABNORMAL LOW (ref 6.5–8.1)

## 2024-04-23 LAB — HEPATITIS PANEL, ACUTE
HCV Ab: NONREACTIVE
Hep A IgM: NONREACTIVE
Hep B C IgM: NONREACTIVE
Hepatitis B Surface Ag: NONREACTIVE

## 2024-04-23 NOTE — Plan of Care (Signed)
" °  Problem: Education: Goal: Knowledge of General Education information will improve Description: Including pain rating scale, medication(s)/side effects and non-pharmacologic comfort measures Outcome: Progressing   Problem: Health Behavior/Discharge Planning: Goal: Ability to manage health-related needs will improve Outcome: Progressing   Problem: Clinical Measurements: Goal: Will remain free from infection Outcome: Progressing Goal: Diagnostic test results will improve Outcome: Progressing Goal: Respiratory complications will improve Outcome: Progressing Goal: Cardiovascular complication will be avoided Outcome: Progressing   Problem: Activity: Goal: Risk for activity intolerance will decrease Outcome: Progressing   Problem: Nutrition: Goal: Adequate nutrition will be maintained Outcome: Progressing   Problem: Coping: Goal: Level of anxiety will decrease Outcome: Progressing   Problem: Elimination: Goal: Will not experience complications related to bowel motility Outcome: Progressing Goal: Will not experience complications related to urinary retention Outcome: Progressing   Problem: Pain Managment: Goal: General experience of comfort will improve and/or be controlled Outcome: Progressing   Problem: Safety: Goal: Ability to remain free from injury will improve Outcome: Progressing   Problem: Skin Integrity: Goal: Risk for impaired skin integrity will decrease Outcome: Progressing   Problem: Education: Goal: Ability to describe self-care measures that may prevent or decrease complications (Diabetes Survival Skills Education) will improve Outcome: Progressing   Problem: Coping: Goal: Ability to adjust to condition or change in health will improve Outcome: Progressing   Problem: Fluid Volume: Goal: Ability to maintain a balanced intake and output will improve Outcome: Progressing   Problem: Health Behavior/Discharge Planning: Goal: Ability to identify and  utilize available resources and services will improve Outcome: Progressing Goal: Ability to manage health-related needs will improve Outcome: Progressing   Problem: Metabolic: Goal: Ability to maintain appropriate glucose levels will improve Outcome: Progressing   Problem: Nutritional: Goal: Maintenance of adequate nutrition will improve Outcome: Progressing Goal: Progress toward achieving an optimal weight will improve Outcome: Progressing   Problem: Skin Integrity: Goal: Risk for impaired skin integrity will decrease Outcome: Progressing   Problem: Tissue Perfusion: Goal: Adequacy of tissue perfusion will improve Outcome: Progressing   Problem: Education: Goal: Ability to describe self-care measures that may prevent or decrease complications (Diabetes Survival Skills Education) will improve Outcome: Progressing Goal: Individualized Educational Video(s) Outcome: Progressing   Problem: Coping: Goal: Ability to adjust to condition or change in health will improve Outcome: Progressing   Problem: Fluid Volume: Goal: Ability to maintain a balanced intake and output will improve Outcome: Progressing   Problem: Health Behavior/Discharge Planning: Goal: Ability to identify and utilize available resources and services will improve Outcome: Progressing Goal: Ability to manage health-related needs will improve Outcome: Progressing   Problem: Metabolic: Goal: Ability to maintain appropriate glucose levels will improve Outcome: Progressing   Problem: Nutritional: Goal: Maintenance of adequate nutrition will improve Outcome: Progressing Goal: Progress toward achieving an optimal weight will improve Outcome: Progressing   Problem: Skin Integrity: Goal: Risk for impaired skin integrity will decrease Outcome: Progressing   Problem: Tissue Perfusion: Goal: Adequacy of tissue perfusion will improve Outcome: Progressing   "

## 2024-04-23 NOTE — Progress Notes (Signed)
 " PROGRESS NOTE    Billy [name]  FMW:[id number] DOB: 1950/04/24 DOA: 01/21/2024 PCP: Seabron Lenis, MD  Chronically ill 73/M prolonged hospitalization from 3/10-4/3 with cardiac arrest, A-fib, DVTs, hematochezia, IVC filter, chest tubes, eventually stabilized, discharged to rehab and then went home, readmitted 10/5 with large esophageal tear 10/7 esophageal stent placed by cardiothoracic 10/15 stent reposition 10/16 extubated 10/20 reintubated with ultrasound showing left-sided pleural effusion-left-sided chest tube placed-by lights criteria exudative effusion 10/23 echo 60-65% 10/24 barium swallow showed persistent leak 10/28 transferred out of ICU 11/3 PEG tube placed 11/19 swelling of right arm showing on ultrasound SVT cephalic vein 11/22 chest x-ray?  Pneumonia done because of back pain-follow-up CT scan shows stent in place no mediastinal fluid collection small bilateral pleural effusions with bibasilar atelectasis pneumonia not excluded-scattered clusters nodular densities predominantly right upper and right lower lobes with nodule 11 mm as previously seen?  Mets?  Infection?  Aspiration 11/26 stent removed. Esophagram negative for esophageal leak.  11/27 getting diuresis again 11/28 duodenitis/enteritis.  GI consulted. Bloody emesis added carafate   11/29 trialing clears 12/1 NV again w/ hematemesis. Lasix  held ---  esophagram performed showing patent esophagus without perforation irregularity mid esophagus?  Stent diffuse narrowing lumen distal duodenum and proximal jejunum 12/2 EGD ischemic ulcers. Impression:  - Congested, inflamed, nodular, texture changed  mucosa in the esophagus. - Gastritis, Non-bleeding duodenal ulcers with a clean ulcer  base (Forrest Class III).- Non-bleeding jejunal ulcers with a clean ulcer   base (Forrest Class III)  - Findings suggestive of ischemic ulcers. Increased carafate  and cont BID PPI  12/3 full liq diet  12/4 marked increased WOB. Acute and  progressive w/ increased hypoxia and HTN. 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 12/17 off clev transition to iHD, transfer to floor  12/18: TDC placed 12/19 [name] assumed care, has distended and painful abdomen, KUB shows improvement so tube feeds resumed, Eliquis  resumed 12/22 hemoccult neg---transfused 1 U--- Eliquis  had to be held in the setting 12/23 vascular consulted secondary to lower extremities----x-ray shows congestion no pneumonia white count up-- 12/24 ABIs showed abnormal right and left toe brachial indices-no intervention from vascular required 12/26 severe respiratory distress needing nonrebreather 12/29, EP consulted for recurrent ventricular tachycardia, recurrent A-fib RVR> Amio gtt. 12/30, started on mexiletine 12/31, LFTs trending up, amiodarone  and mexiletine discontinued,  1/1: LFTs continue to worsen, HIDA scan ordered, palliative care consulted 1/2: HIDA scan negative for cholecystitis, holding tube feeds which could be contributing to cholestasis Restarting IV heparin  for persistent A-fib       1/3: AST ALT improving, alk phos still significantly elevated 1/5: switched IV Heparin  to Eliquis      Subjective: - Feels fair overall, oral intake is improving  Assessment and Plan:  Acute kidney injury, new ESRD Volume overload -sp CRRT 12/10-12/16, TDC placed 12/18 - ANA positive, C4 low, antihistone antibody positive, noted to have hematuria as well  - Renal biopsy 12/16 noted 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  complex deposits, felt to be secondary to infection  - Completed course of antibiotics  - Currently on steroids per nephrology  -  sp HD yesterday monitoring for renal recovery, making urine but no signif recovery yet - Consulted palliative care with all above ongoing issues and high risk of decompensation, wishes for full code and full scope for now, for another meeting today  Nonspecific leukocytosis -Afebrile, has completed multiple rounds of antibiotics - Ongoing prednisone  could be contributing,  - with abnormal LFTs and mild abdominal tenderness chronic cholecystitis was felt to be a possibility, RUQ US  q/ GB wall thickening, CBD was normal, HIDA scan finally completed 1/2 morning, negative for acute cholecystitis  Boerhaave syndrome Recurrent upper GI bleed - 10/7 had esophageal stent placed by CT surgery - 10/15, esophageal stent repositioned - 11/26: Esophageal stent removed, no further leak -Ischemic ulcer disease--GI and cardiothoracic aware-several CTs since 11/25---CT scan done 12/21 unremarkable, no Carafate  with HD, Maalox discontinued -Last esophagram as above 12/1 -Continue Protonix , changed to p.o. - No further bleeding now, has been on IV heparin , switch to oral Eliquis  1/5  Elevated LFT's - LFTs significantly elevated since 12/30, had not been checked for 20 days prior, could have been slowly trending up -On CT abd from 12/21 with mild biliary ductal dilation vs mild periportal edema and tiny layering stone in GB neck - Right upper quadrant ultrasound was nonspecific, ascites and gallbladder wall thickening, CBD was normal, has mild epigastric and right sided tenderness on exam, HIDA scan negative for cholecystitis 1/2 - Held tube feeds in case that could be contributing to cholestasis - Amiodarone  discontinued 12/31, 1/1 mexiletine was discontinued due to predominant hepatic metabolism - off lipitor  and Tylenol  - Suspect could have had a shock liver type injury BP  dropped to 90s 12/28-29, versus passive hepatic congestion from volume overloaded state, LFTs slowly improving, alk phos still significantly elevated however no obstructive pattern on imaging - Slowly improving now  Severe Malnutrition-BMI 22 evidence of weight loss -S/p PEG -Continue dysphagia 3 diet , holding TFs temporarily, oral intake improving  Normocytic anemia secondary to peptic ulcer disease,?  Ongoing gastritis as above -Transfused PRBC x 1u in October, x1 unit 03/29/2024,12/23, 12/28  Cardiac arrest 06/2023 with HF MR EF 55-65% 03/27/2024  A-fib RVR CHADVASC >4 on Eliquis  [cardioverted 06/2023]--- recurrent RVR at times -Eliquis  has been held since 12/22  -cardiology following again for recurrent A-fib RVR -Now on low-dose Coreg , amiodarone  and mexiletine discontinued 12/31 with abnormal LFTs - We started IV heparin  1/2, switch to DOAC today   DVT PE March 2025 -does have IVC filter in place since March Lower limb ischemia ruled out 12.23 ABI rules out ischemia-appreciate VVS--no intervention needed - Now back on anticoagulation with IV heparin , switch to DOAC soon  Insomnia - Continued on regimen of trazodone  Seroquel  and hydroxyzine   Diabetes - CBG stable  Multiple bedsores and wounds anterior right and left foot and injury stage I to bottom -Continue Gerhardt twice daily Lidoderm  patch for back pain  VT - recurrent runs of VT 12/28 - Cards was following, amiodarone  and mexiletine discontinued with rising LFTs, continue low-dose Coreg , recommended Toprol  if he has recurrence   Resolved/stable problems Petechial rash Hydralazine  and Unasyn  have been stopped, cannot rule out HSP-this seems unlikely to me however he has mottling in general over his skin-see the pictures from 12/22 of his feet-seem stable to me Acute hypoxic respiratory failure//Aspiration Pneumonia//Pleural effusion//Acute on chronic diastolic CHF exacerbation       -respiratory failure due to  pneumonia, pleural effusion, CHF exacerbation -- Better c Lasix  and antibiotics. Intemrittently seems to have this  Bilateral UE swelling--duplex upper extremities ruled out DVT Lung nodules-repeat CT in Jan/Feb window       DVT prophylaxis: eliquis  Code Status: FUll Code Family Communication: none present Disposition Plan: SNF if he improves   Consultants:    Procedures:   Antimicrobials:    Objective: Vitals:   04/22/24 2005 04/23/24 0014 04/23/24 0403 04/23/24 0812  BP: 132/68 133/64 132/71 125/64  Pulse: 84 76 79 85  Resp: 20 19 (!) 21 17  Temp: 98.4 F (36.9 C) 98.4 F (36.9 C) 98.5 F (36.9 C) 98 F (36.7 C)  TempSrc: Oral Oral Oral Oral  SpO2: 92% 96% 91% 95%  Weight:   70.7 kg   Height:        Intake/Output Summary (Last 24 hours) at 04/23/2024 1010 Last data filed at 04/23/2024 0407 Gross per 24 hour  Intake 240 ml  Output 2600 ml  Net -2360 ml   Filed Weights   04/22/24 0300 04/22/24 0824 04/23/24 0403  Weight: 71.7 kg 71.7 kg 70.7 kg    Examination:  General exam: Chronically ill-appearing, AAO x 2, no distress, irritable this morning HEENT: Positive JVD, right IJ HD catheter CVS: S1-S2, irregular rhythm Lungs: Clear anteriorly, decreased breath sounds at the bases Abdomen: Mildly distended, soft, nontender, PEG tube noted Extremities: 1+ edema Psychiatry: Flat affect    Data Reviewed:   CBC: Recent Labs  Lab 04/18/24 0423 04/19/24 0500 04/20/24 0607 04/21/24 0529 04/22/24 0436  WBC 14.6* 15.3* 15.9* 15.2* 14.6*  HGB 7.6* 8.7* 7.5* 7.7* 7.8*  HCT 23.2* 26.8* 23.1* 23.7* 24.2*  MCV 82.3 82.7 82.2 81.7 83.2  PLT 238 258 298 320 326   Basic Metabolic Panel: Recent Labs  Lab 04/17/24 0416 04/18/24 0423 04/19/24 0500 04/20/24 0607 04/21/24 0529 04/22/24 0436 04/23/24 0322  NA 133*   < > 134* 134* 133* 135 135  K 4.3   < > 4.7 4.1 4.2 4.2 4.2  CL 94*   < > 95* 95* 94* 97* 97*  CO2 28   < > 29 31 28 29 30   GLUCOSE 134*   < > 87  91 98 89 93  BUN 85*   < > 77* 49* 63* 73* 42*  CREATININE 3.20*   < > 3.23* 2.59* 3.29* 3.84* 2.58*  CALCIUM  7.9*   < > 8.3* 8.1* 8.4* 8.4* 8.4*  MG 2.3  --   --   --   --   --   --    < > = values in this interval not displayed.   GFR: Estimated Creatinine Clearance: 26.3 mL/min (A) (by C-G formula based on SCr of 2.58 mg/dL (H)). Liver Function Tests: Recent Labs  Lab 04/19/24 0500 04/20/24 0607 04/21/24 0529 04/22/24 0436 04/23/24 0322  AST 193* 173* 127* 103* 93*  ALT 380* 299* 249* 203* 166*  ALKPHOS 1,547* 1,835* 1,744* 1,695* 1,549*  BILITOT 1.8* 2.3* 1.5* 1.5* 1.2  PROT 5.1* 5.0* 5.3* 5.4* 5.5*  ALBUMIN  2.1* 1.9* 2.2* 2.2* 2.1*   No results for input(s): LIPASE, AMYLASE in the last 168 hours. No results for input(s): AMMONIA in the last 168 hours. Coagulation Profile: No results for input(s): INR, PROTIME in the last 168 hours. Cardiac Enzymes: Recent Labs  Lab 04/20/24 0607  CKTOTAL 35*   BNP (last 3 results) No results for input(s): PROBNP in the last 8760 hours. HbA1C: No results for input(s): HGBA1C in the last 72 hours. CBG: Recent Labs  Lab 04/22/24 1631 04/22/24 2003 04/23/24 0011  04/23/24 0401 04/23/24 0810  GLUCAP 133* 130* 97 87 144*   Lipid Profile: No results for input(s): CHOL, HDL, LDLCALC, TRIG, CHOLHDL, LDLDIRECT in the last 72 hours. Thyroid  Function Tests: No results for input(s): TSH, T4TOTAL, FREET4, T3FREE, THYROIDAB in the last 72 hours. Anemia Panel: No results for input(s): VITAMINB12, FOLATE, FERRITIN, TIBC, IRON , RETICCTPCT in the last 72 hours. Urine analysis:    Component Value Date/Time   COLORURINE YELLOW 03/27/2024 1122   APPEARANCEUR CLEAR 03/27/2024 1122   LABSPEC 1.025 03/27/2024 1122   PHURINE 5.5 03/27/2024 1122   GLUCOSEU NEGATIVE 03/27/2024 1122   HGBUR LARGE (A) 03/27/2024 1122   BILIRUBINUR SMALL (A) 03/27/2024 1122   KETONESUR NEGATIVE 03/27/2024 1122    PROTEINUR >300 (A) 03/27/2024 1122   UROBILINOGEN 0.2 08/31/2019 1136   NITRITE NEGATIVE 03/27/2024 1122   LEUKOCYTESUR TRACE (A) 03/27/2024 1122   Sepsis Labs: @LABRCNTIP (procalcitonin:4,lacticidven:4)  )No results found for this or any previous visit (from the past 240 hours).   Radiology Studies: No results found.    Scheduled Meds:  apixaban   5 mg Oral BID   carvedilol   3.125 mg Per Tube BID WC   Chlorhexidine  Gluconate Cloth  6 each Topical Daily   clonazePAM   0.5 mg Per Tube BID   famotidine   20 mg Per Tube Daily   feeding supplement (NEPRO CARB STEADY)  237 mL Oral BID BM   [name] butt cream   Topical BID   guaiFENesin   10 mL Per Tube Q4H   insulin  aspart  0-9 Units Subcutaneous Q4H   lidocaine   1 patch Transdermal Q24H   multivitamin  1 tablet Oral QHS   pantoprazole   40 mg Oral BID   predniSONE   10 mg Oral Q breakfast   saccharomyces boulardii  250 mg Per Tube BID   sodium chloride  flush  10-40 mL Intracatheter Q12H   torsemide   100 mg Oral Daily   Continuous Infusions:  promethazine  (PHENERGAN ) injection (IM or IVPB) 12.5 mg (04/05/24 1153)     LOS: 93 days    Time spent:    Sigurd Pac, MD [facility]   04/23/2024, 10:10 AM    "

## 2024-04-23 NOTE — Progress Notes (Signed)
 "                                                                                                                                                         Daily Progress Note   Patient Name: Billy [name]       Date: 04/23/2024 DOB: 04-08-1951  Age: 74 y.o. MRN#: [id number] Attending Physician: Fairy Frames, MD Primary Care Physician: Seabron Lenis, MD Admit Date: 01/21/2024  Reason for Consultation/Follow-up: Establishing goals of care  Subjective: Medical records reviewed including progress notes, labs and imaging. Patient assessed at the bedside.  He is frustrated with the monitor alarming due to pulse ox not capturing oxygen  saturation.  Called front desk staff requesting a new pulse ox.  No visitors were present.  Created space and opportunity for patient's thoughts and feelings on his current illness.  He shared his conversations with social work so far today.  He does not like the feeling that he could be kicked out and sent to SNF at any time without much notice.  He did not want to be here as long as he has.  Provided reassurance that the team would give him as much notice if they are able.    Emotional support and therapeutic listening was provided as patient shared his feelings of discouragement and doubt.  He is trying his best to be a good Christian and continues to request prayer for his kidney to wake up and function.  I offered to call his son to review medical update but he declined.  He again tells me that I am not ready to give up living and that his goal is to return to his own home where he can handle his daily activities independently.  Patient tells me he has been confused over the weekend but was hard to keep him focused and determine what exactly has been confusing.  PMT will continue to support holistically.  Length of Stay: 73   Physical Exam Vitals and nursing note reviewed.  Constitutional:      General: He is not in acute distress.    Appearance: He is  ill-appearing.  HENT:     Head: Normocephalic and atraumatic.  Cardiovascular:     Rate and Rhythm: Normal rate.  Pulmonary:     Effort: Pulmonary effort is normal. No respiratory distress.  Neurological:     Mental Status: He is alert and oriented to person, place, and time.  Psychiatric:        Behavior: Behavior normal.        Cognition and Memory: Cognition normal.     Comments: Frustrated            Vital Signs: BP 125/64 (BP Location: Left Arm)   Pulse 85   Temp 98 F (36.7 C) (Oral)   Resp 17  Ht 6' 1 (1.854 m)   Wt 70.7 kg   SpO2 95%   BMI 20.56 kg/m  SpO2: SpO2: 95 % O2 Device: O2 Device: Nasal Cannula O2 Flow Rate: O2 Flow Rate (L/min): 2 L/min      Palliative Assessment/Data: 40%   Palliative Care Assessment & Plan   Patient Profile: 74 y.o. 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  Patient had stomach virus with nausea and vomiting for days up until this event.   Hospital events as follows: 10/7 esophageal stent placed by cardiothoracic 10/15 stent reposition 10/16 extubated 10/20 reintubated with ultrasound showing left-sided pleural effusion-left-sided chest tube placed-by lights criteria exudative effusion 10/23 echo 60-65% 10/24 barium swallow showed persistent leak 10/28 transferred out of ICU 11/3 PEG tube placed 11/19 swelling of right arm showing on ultrasound SVT cephalic vein 11/22 chest x-ray?  Pneumonia done because of back pain-follow-up CT scan shows stent in place no mediastinal fluid collection small bilateral pleural effusions with bibasilar atelectasis pneumonia not excluded-scattered clusters nodular densities predominantly right upper and right lower lobes with nodule  11 mm as previously seen?  Mets?  Infection?  Aspiration 11/26 stent removed. Esophagram negative for esophageal leak.  11/27 getting diuresis again 11/28 duodenitis/enteritis.  GI consulted. Bloody emesis added carafate   11/29 trialing clears 12/1 NV again w/ hematemesis. Lasix  held ---  esophagram performed showing patent esophagus without perforation irregularity mid esophagus?  Stent diffuse narrowing lumen distal duodenum and proximal jejunum 12/2 EGD ischemic ulcers. Impression:  - Congested, inflamed, nodular, texture changed  mucosa in the esophagus. - Gastritis, Non-bleeding duodenal ulcers with a clean ulcer  base (Forrest Class III).- Non-bleeding jejunal ulcers with a clean ulcer   base (Forrest Class III)  - Findings suggestive of ischemic ulcers. Increased carafate  and cont BID PPI  12/3 full liq diet  12/4 marked increased WOB. Acute and progressive w/ increased hypoxia and HTN. 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 12/30, started on mexiletine 12/31, LFTs trending up, amiodarone  and mexiletine discontinued, FU RUQ US    PMT has been consulted day 88 to assist with goals of care conversation in light of poor long-term prognosis, this week's respiratory distress, sustained VT on 12/29, and A-fib with RVR.  PMT is familiar with patient from prolonged hospitalization 3/10-4/3.  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 on 11/26 Elevated LFTs, improving and likely due to medications A-fib with RVR intermittently  Recommendations/Plan: Continue full code/full scope treatment Patient's goal remains consistent for life-prolonging care with hope that he could return to his own home and stay independent for as long as he can Psychosocial and emotional support provided PMT will continue to follow   Prognosis: Poor long-term prognosis  Discharge Planning: SNF with outpatient palliative care (previously established with [facility])   Care plan was discussed with patient, MD   Brandelyn Henne SHAUNNA Fell, PA-C  Palliative Medicine Team Team phone # [phone]  Thank you for allowing the Palliative Medicine Team to assist in the care of this patient. Please utilize secure chat with additional questions, if there is no response within 30 minutes please call the above phone number.  Palliative Medicine Team providers are available by phone from 7am to 7pm daily and can be reached through the team cell phone.  Should this patient require assistance outside of these hours, please call the patient's attending physician.   Billing based on time  Time Total: 35  Visit consisted of counseling and education dealing with the complex and emotionally intense issues of symptom management and palliative care in the setting of serious and potentially life-threatening illness. Greater  than 50% of this time was spent counseling and coordinating care related to the above assessment and plan.  Personally spent 35 minutes in patient care including extensive chart review (labs, imaging, progress/consult notes, vital signs), medically appropraite exam, discussed with treatment team, education to patient, family, and staff, documenting clinical information, medication review and management, coordination of care, and available advanced directive documents.   "

## 2024-04-23 NOTE — Progress Notes (Signed)
 [facility] Progress Note   Assessment/ Plan:   Dialysis dependent AKI on CKD 3b at baseline has L sided near occlusive renal artery s/p CRRT 12/10-12/16 ANA +, C4 low, + hematuria, anti-histone Ab +. 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  He has completed rounds of antibiotics.  Mild interstitial fibrosis/tubular atrophy.  Given concern for infection we provided steroids but avoided other IS medications due to weighing risks/benefits on pred taper now with plans to wean quickly given the main concern is infection as a cause he was on minoxidil  which caries similar risk for AI kidney phenomena as hydralazine . Stopped this (last dose 12/22)  Oliguric but no significant evidence of GFR recovery, continue dialysis Continue HD on MWF schedule: HD next Wednesday  Petechial rash;             - 2/2 Iga. S/p steroids, improved   Acute on chronic CHF exacerbation;             - HD and diuretics as aboven daily torsemide    H/o DVT and PE:             - Anticoagulation per cardiology and primary service   Boorehave's syndrome             - s/p repair             - Has a PEG  - EGD 03/19/24 with ulcers  - PPI and pepcid    HTN  - Carvedilol , followed BPs with UF  Elevated LFTs  - HIDA negative 04/19/24  - ALP out of proportion to AST/ ALT--> ? Bone turnover and was WNL until 03/27/24.  Note Ca not high  Dispo: Likely SNF - pending placement as of 1/2, To go to [facility] at  time of discharge   Subjective:    Seen in room.  Sleeping, looks exhausted   Objective:   BP (!) 114/54 (BP Location: Left Arm)   Pulse 79   Temp 98.2 F (36.8 C) (Oral)   Resp 19   Ht 6' 1 (1.854 m)   Wt 70.7 kg   SpO2 95%   BMI 20.56 kg/m   Intake/Output Summary (Last 24 hours) at 04/23/2024 1330 Last data filed at 04/23/2024  0407 Gross per 24 hour  Intake --  Output 600 ml  Net -600 ml    Weight change: 0 kg  Physical Exam: GEN: Lying in bed, sleeping PULM: normal WOB, clear ant CV: Normal rate, regular rhythm ABD: + PEG, mild distention EXT: trace edema NEURO: sleeping Dialysis access: RIJ HD catheter  Imaging: No results found.     Labs: BMET Recent Labs  Lab 04/17/24 0416 04/18/24 0423 04/19/24 0500 04/20/24 0607 04/21/24 0529 04/22/24 0436 04/23/24 0322  NA 133* 134* 134* 134* 133* 135 135  K 4.3 4.5 4.7 4.1 4.2 4.2 4.2  CL 94* 95* 95* 95* 94* 97* 97*  CO2 28 30 29 31 28 29 30   GLUCOSE 134* 148* 87 91 98 89 93  BUN 85* 59* 77* 49* 63* 73* 42*  CREATININE 3.20* 2.54* 3.23* 2.59* 3.29* 3.84* 2.58*  CALCIUM  7.9* 7.9* 8.3* 8.1* 8.4* 8.4* 8.4*   CBC Recent Labs  Lab 04/19/24 0500 04/20/24 0607 04/21/24 0529 04/22/24 0436  WBC 15.3* 15.9* 15.2* 14.6*  HGB 8.7* 7.5* 7.7* 7.8*  HCT 26.8* 23.1* 23.7* 24.2*  MCV 82.7 82.2  81.7 83.2  PLT 258 298 320 326    Medications:     apixaban   5 mg Oral BID   carvedilol   3.125 mg Per Tube BID WC   Chlorhexidine  Gluconate Cloth  6 each Topical Daily   clonazePAM   0.5 mg Per Tube BID   famotidine   20 mg Per Tube Daily   feeding supplement (NEPRO CARB STEADY)  237 mL Oral BID BM   [name] butt cream   Topical BID   guaiFENesin   10 mL Per Tube Q4H   insulin  aspart  0-9 Units Subcutaneous Q4H   lidocaine   1 patch Transdermal Q24H   multivitamin  1 tablet Oral QHS   pantoprazole   40 mg Oral BID   predniSONE   10 mg Oral Q breakfast   saccharomyces boulardii  250 mg Per Tube BID   sodium chloride  flush  10-40 mL Intracatheter Q12H   torsemide   100 mg Oral Daily

## 2024-04-23 NOTE — Progress Notes (Signed)
 Calorie Count Note- Day 1  Ongoing hour calorie count ordered.  Diet: Dysphagia 3 Supplements: Nepro BID  Estimated Nutritional Needs:  Kcal:  2300-2500 kcals Protein:  110-130g Fluid:  1L + UOP  Lunch: 290 kcal and 18g protein Dinner: 19 kcal and 3g protein Breakfast: 319 kcal and 10g protein Supplements: 70% of an ensure (~245 kcal and 14g protein)  Total intake: 873 kcal (38% of minimum estimated needs)  45g protein (41% of minimum estimated needs)  NUTRITION DIAGNOSIS:  Severe Malnutrition related to acute illness (may have chronic component due to time frame of reported wt loss) as evidenced by severe fat depletion, moderate muscle depletion   GOAL:  Patient will meet greater than or equal to 90% of their needs; unmet.  INTERVENTION:  Calorie count Recommend resume TF via PEG with nocturnal regimen: Vital 1.5 at 80 ml/h x 12 hours (6pm-6am) Provides 1440 kcal, 65 gm protein, 733 ml free water  daily (60% of estimated kcal and protein needs) Nepro Shake po BID, each supplement provides 425 kcal and 19 grams protein Renal MVI  Royce Maris, RDN, LDN Clinical Nutrition See AMiON for contact information.

## 2024-04-23 NOTE — TOC Progression Note (Signed)
 Transition of Care [facility]) - Progression Note    Patient Details  Name: Billy [name] MRN: [id number] Date of Birth: 08-06-1950  Transition of Care [facility]) CM/SW Contact  [name], [place] Phone Number: 04/23/2024, 10:43 AM  Clinical Narrative:   Patient requested to see CSW. CSW met patient at bedside and patient expressed his concerns with the Hospitalist. Patient stated he felt like doctors going to throw me out in the next 5 minutes. CSW assured patient that is not the case and MD was speaking from a discharge planning perspective. Patients stated ok and requested CSW follow up with his son, [name].   CSW called [name] and confirmed current plan for discharge is for patient to go to [facility] when more medically stable. CSW explained insurance auth process. CSW to follow up with MD about potential medical readiness within the upcoming days/week.   CSW will continue to follow.    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, SNF Pending bed offer               Expected Discharge Plan and Services In-house Referral: Clinical Social Work Discharge Planning Services: [employer] Consult Post Acute Care Choice: Skilled Nursing Facility Living arrangements for the past 2 months: Single Family Home                                       Social Drivers of Health (SDOH) Interventions SDOH Screenings   Food Insecurity: No Food Insecurity (01/22/2024)  Housing: High Risk (01/22/2024)  Transportation Needs: No Transportation Needs (01/22/2024)  Utilities: Not At Risk (01/22/2024)  Social Connections: Socially Isolated (01/22/2024)  Tobacco Use: Low Risk (03/19/2024)    Readmission Risk Interventions    06/27/2023    1:52 PM 10/14/2022   11:29 AM  Readmission Risk Prevention Plan  Post Dischage Appt  Complete  Medication Screening  Complete  Transportation Screening Complete Complete  HRI or Home  Care Consult Complete   Social Work Consult for Recovery Care Planning/Counseling Complete   Palliative Care Screening Not Applicable   Medication Review Oceanographer) Referral to Pharmacy

## 2024-04-24 ENCOUNTER — Inpatient Hospital Stay (HOSPITAL_COMMUNITY)

## 2024-04-24 DIAGNOSIS — K223 Perforation of esophagus: Secondary | ICD-10-CM | POA: Diagnosis not present

## 2024-04-24 LAB — COMPREHENSIVE METABOLIC PANEL WITH GFR
ALT: 135 U/L — ABNORMAL HIGH (ref 0–44)
AST: 70 U/L — ABNORMAL HIGH (ref 15–41)
Albumin: 2.1 g/dL — ABNORMAL LOW (ref 3.5–5.0)
Alkaline Phosphatase: 1307 U/L — ABNORMAL HIGH (ref 38–126)
Anion gap: 11 (ref 5–15)
BUN: 51 mg/dL — ABNORMAL HIGH (ref 8–23)
CO2: 27 mmol/L (ref 22–32)
Calcium: 8.5 mg/dL — ABNORMAL LOW (ref 8.9–10.3)
Chloride: 99 mmol/L (ref 98–111)
Creatinine, Ser: 3.33 mg/dL — ABNORMAL HIGH (ref 0.61–1.24)
GFR, Estimated: 19 mL/min — ABNORMAL LOW
Glucose, Bld: 91 mg/dL (ref 70–99)
Potassium: 3.8 mmol/L (ref 3.5–5.1)
Sodium: 137 mmol/L (ref 135–145)
Total Bilirubin: 1.2 mg/dL (ref 0.0–1.2)
Total Protein: 5.4 g/dL — ABNORMAL LOW (ref 6.5–8.1)

## 2024-04-24 LAB — GLUCOSE, CAPILLARY
Glucose-Capillary: 124 mg/dL — ABNORMAL HIGH (ref 70–99)
Glucose-Capillary: 148 mg/dL — ABNORMAL HIGH (ref 70–99)
Glucose-Capillary: 88 mg/dL (ref 70–99)
Glucose-Capillary: 88 mg/dL (ref 70–99)
Glucose-Capillary: 90 mg/dL (ref 70–99)
Glucose-Capillary: 96 mg/dL (ref 70–99)

## 2024-04-24 LAB — CBC
HCT: 24.1 % — ABNORMAL LOW (ref 39.0–52.0)
Hemoglobin: 7.6 g/dL — ABNORMAL LOW (ref 13.0–17.0)
MCH: 27.2 pg (ref 26.0–34.0)
MCHC: 31.5 g/dL (ref 30.0–36.0)
MCV: 86.4 fL (ref 80.0–100.0)
Platelets: 293 K/uL (ref 150–400)
RBC: 2.79 MIL/uL — ABNORMAL LOW (ref 4.22–5.81)
RDW: 19.9 % — ABNORMAL HIGH (ref 11.5–15.5)
WBC: 13.1 K/uL — ABNORMAL HIGH (ref 4.0–10.5)
nRBC: 0.2 % (ref 0.0–0.2)

## 2024-04-24 LAB — ANA: Anti Nuclear Antibody (ANA): POSITIVE — AB

## 2024-04-24 LAB — MITOCHONDRIAL ANTIBODIES: Mitochondrial M2 Ab, IgG: <20 U (ref 0.0–20.0)

## 2024-04-24 MED ORDER — PREDNISONE 10 MG PO TABS: 10.0000 mg | ORAL_TABLET | Freq: Every day | ORAL | Status: AC

## 2024-04-24 MED ORDER — TORSEMIDE 60 MG PO TABS: 60.0000 mg | ORAL_TABLET | Freq: Every day | ORAL | Status: DC

## 2024-04-24 MED ORDER — TRAZODONE HCL 50 MG PO TABS: 50.0000 mg | ORAL_TABLET | Freq: Every evening | ORAL | Status: DC | PRN

## 2024-04-24 MED ORDER — SACCHAROMYCES BOULARDII 250 MG PO CAPS: 250.0000 mg | ORAL_CAPSULE | Freq: Two times a day (BID) | ORAL | Status: AC

## 2024-04-24 MED ORDER — CLONAZEPAM 0.5 MG PO TABS: 0.5000 mg | ORAL_TABLET | Freq: Two times a day (BID) | ORAL | 0 refills | Status: DC | PRN

## 2024-04-24 MED ORDER — HEPARIN SODIUM (PORCINE) 1000 UNIT/ML IJ SOLN
3800.0000 [IU] | Freq: Once | INTRAMUSCULAR | Status: AC
  Administered 2024-04-24: 3800 [IU]

## 2024-04-24 MED ORDER — PANTOPRAZOLE SODIUM 40 MG PO TBEC: 40.0000 mg | DELAYED_RELEASE_TABLET | Freq: Two times a day (BID) | ORAL | Status: AC

## 2024-04-24 MED ORDER — GADOBUTROL 1 MMOL/ML IV SOLN
7.0000 mL | Freq: Once | INTRAVENOUS | Status: AC | PRN
  Administered 2024-04-24: 7 mL via INTRAVENOUS

## 2024-04-24 MED ORDER — POLYETHYLENE GLYCOL 3350 17 G PO PACK: 17.0000 g | PACK | Freq: Every day | ORAL | Status: AC | PRN

## 2024-04-24 MED ORDER — CARVEDILOL 3.125 MG PO TABS: 3.1250 mg | ORAL_TABLET | Freq: Two times a day (BID) | ORAL | Status: AC

## 2024-04-24 MED ORDER — OXYCODONE HCL 5 MG PO TABS: 5.0000 mg | ORAL_TABLET | ORAL | 0 refills | Status: DC | PRN

## 2024-04-24 MED ORDER — HEPARIN SODIUM (PORCINE) 1000 UNIT/ML IJ SOLN
INTRAMUSCULAR | Status: AC
  Filled 2024-04-24: qty 4

## 2024-04-24 NOTE — Progress Notes (Signed)
 Calorie Count Note- Day 2  Ongoing hour calorie count ordered. Pt reports he does not like a lot of the food offered and will not eat food he does not like. Discussed menu options with pt and encouraged him to order the foods he does like to promote adequate intake. Pt routinely ordering cereal for meals. PO intake continues to not meet nutrition needs (currently meeting 26-38% calorie and 25-41% protein needs).  Diet: Dysphagia 3 Supplements: Nepro BID  Estimated Nutritional Needs:  Kcal:  2300-2500 kcals Protein:  110-130g Fluid:  1L + UOP  Lunch: 50% pot roast, potatoes, gravy, and carrots (186 kcal, 10.5 g protein) Dinner: 25% rice krispies and cheerios, milk (102 kcal, 4.5 g protein) Breakfast: 100% frosted flakes, milk (303 kcal, 10 g protein) Supplements: none  Total intake: 591 kcal (26% of minimum estimated needs)  25g protein (23% of minimum estimated needs)  NUTRITION DIAGNOSIS:  Severe Malnutrition related to acute illness (may have chronic component due to time frame of reported wt loss) as evidenced by severe fat depletion, moderate muscle depletion   GOAL:  Patient will meet greater than or equal to 90% of their needs; unmet.  INTERVENTION:  Calorie count Recommend resume TF via PEG with nocturnal regimen: Vital 1.5 at 80 ml/h x 12 hours (6pm-6am) Provides 1440 kcal, 65 gm protein, 733 ml free water  daily (60% of estimated kcal and protein needs) Nepro Shake po BID, each supplement provides 425 kcal and 19 grams protein Renal MVI     Josette Glance, MS, RDN, LDN Clinical Dietitian I Please reach out via secure chat

## 2024-04-24 NOTE — Progress Notes (Signed)
 Physical Therapy Treatment Patient Details Name: Billy [name] MRN: [id number] DOB: [birth year] Today's Date: 04/24/2024   History of Present Illness Billy [name] is a 74 y.o. male who presented [facility] ED 01/21/24 with several days of vomiting followed by an episode of severe tearing epigastric pain. Work-up revealed esophageal tear. Pt s/p esophageal stent with EGD 10/7. Attempted stent repositioning on 10/15 and was hypoxic, was intubated.  Reintubated 10/20-10/24. G tube placed 11/3. He accidentally pulled out the R chest tube on 11/7.L chest tube removed 11/11. Found to have superficial venous thrombosis of RUE 11/19. N&V with epigastric pain 11/28, found severe duodenitis on repeat CT. S/P EGD on 12/2. 12/10 re-intubated due to respiratory distress. CRRT 12/10-12/16. Extubated 12/12. 12/26 severe respiratory distress needing nonrebreather, volume overloaded, required emergent HD. Pending US  abdomen. PMHx: HFrEF, AFib, CAD, PE complicated by cardiogenic shock, CKD 4, MI, peripheral neuropathy, and TIA.    PT Comments  PT session scheduled to work on transferring to HD recliner for HD. Long discussion on goals of care and need to progress to OOB mobility. After encouragement from RN, pt was agreeable to transfer to the recliner. Pt required MinA/ModA for bed mobility with increased time/effort. Pt was then able to stand with MinAx2 and use of RW. TotalA needed for posterior pericare with pt able to stand for ~30 secs before stating I can't do this. Able to then step-pivot to the right with ModAx2. Assist needed to reposition for comfort and to don/doff new hospital gown. Continue to recommend post-acute rehab with acute PT to follow.     If plan is discharge home, recommend the following: Two people to help with walking and/or transfers;Two people to help with bathing/dressing/bathroom;Assistance with cooking/housework;Assistance with feeding;Direct supervision/assist for medications management;Direct  supervision/assist for financial management;Assist for transportation;Help with stairs or ramp for entrance   Can travel by private vehicle      No  Equipment Recommendations  Rolling walker (2 wheels);BSC/3in1;Wheelchair (measurements PT);Wheelchair cushion (measurements PT);Hospital bed       Precautions / Restrictions Precautions Precautions: Fall Recall of Precautions/Restrictions: Intact Precaution/Restrictions Comments: PEG Restrictions Weight Bearing Restrictions Per Provider Order: No     Mobility  Bed Mobility Overal bed mobility: Needs Assistance Bed Mobility: Rolling, Sidelying to Sit Rolling: Min assist, Used rails Sidelying to sit: Mod assist, Used rails    General bed mobility comments: able to bring LE's off EOB with assist to raise trunk. Able to scoot forwards with increased time and effort    Transfers Overall transfer level: Needs assistance Equipment used: Rolling walker (2 wheels) Transfers: Sit to/from Stand, Bed to chair/wheelchair/BSC Sit to Stand: Min assist, +2 physical assistance, +2 safety/equipment   Step pivot transfers: Mod assist, +2 physical assistance, +2 safety/equipment    General transfer comment: MinAx2 to stand from EOB with assist to boost-up. Able to stand for ~30 seconds for posterior pericare. Transferred to recliner on the right with assist to steady       Balance Overall balance assessment: Needs assistance Sitting-balance support: Bilateral upper extremity supported, Feet supported Sitting balance-Leahy Scale: Fair    Standing balance support: Bilateral upper extremity supported, During functional activity, Reliant on assistive device for balance Standing balance-Leahy Scale: Poor Standing balance comment: reliant on UE and external support     Communication Communication Communication: Impaired Factors Affecting Communication: Hearing impaired  Cognition Arousal: Alert Behavior During Therapy: Anxious, Flat affect,  Agitated   PT - Cognitive impairments: Attention, Sequencing, Problem solving    PT -  Cognition Comments: Frustrated about needing to get in the recliner for HD. Would repeatedly state I can't do that Following commands: Impaired Following commands impaired: Follows one step commands with increased time, Only follows one step commands consistently    Cueing Cueing Techniques: Verbal cues, Tactile cues, Gestural cues         Pertinent Vitals/Pain Pain Assessment Pain Assessment: Faces Faces Pain Scale: Hurts little more Pain Location: generalized Pain Descriptors / Indicators: Discomfort, Aching, Sore Pain Intervention(s): Limited activity within patient's tolerance, Monitored during session, Repositioned     PT Goals (current goals can now be found in the care plan section) Acute Rehab PT Goals PT Goal Formulation: With patient Time For Goal Achievement: 04/30/24 Potential to Achieve Goals: Fair Progress towards PT goals: Progressing toward goals    Frequency    Min 2X/week       AM-PAC PT 6 Clicks Mobility   Outcome Measure  Help needed turning from your back to your side while in a flat bed without using bedrails?: A Little Help needed moving from lying on your back to sitting on the side of a flat bed without using bedrails?: A Lot Help needed moving to and from a bed to a chair (including a wheelchair)?: A Lot Help needed standing up from a chair using your arms (e.g., wheelchair or bedside chair)?: Total Help needed to walk in hospital room?: Total Help needed climbing 3-5 steps with a railing? : Total 6 Click Score: 10    End of Session Equipment Utilized During Treatment: Gait belt Activity Tolerance: Patient tolerated treatment well Patient left: in chair;with call bell/phone within reach Nurse Communication: Mobility status;Need for lift equipment PT Visit Diagnosis: Other abnormalities of gait and mobility (R26.89);Muscle weakness (generalized)  (M62.81);Other symptoms and signs involving the nervous system (R29.898);History of falling (Z91.81)     Time: 8673-8643 PT Time Calculation (min) (ACUTE ONLY): 30 min  Charges:    $Therapeutic Activity: 23-37 mins PT General Charges $$ ACUTE PT VISIT: 1 Visit                    Kate ORN, PT, DPT Secure Chat Preferred  Rehab Office [phone]   Kate BRAVO Wendolyn 04/24/2024, 2:05 PM

## 2024-04-24 NOTE — Progress Notes (Signed)
" °   04/24/24 1827  Vitals  Temp (!) 97.4 F (36.3 C)  Pulse Rate 98  Resp 17  BP (!) 143/74  SpO2 97 %  O2 Device Nasal Cannula  Oxygen  Therapy  Patient Activity (if Appropriate) In bed  Pulse Oximetry Type Continuous  Oximetry Probe Site Changed No  Post Treatment  Dialyzer Clearance Lightly streaked  Hemodialysis Intake (mL) 0 mL  Liters Processed 84  Fluid Removed (mL) 2000 mL  Tolerated HD Treatment Yes   Received patient in bed to unit.  Alert and oriented.  Informed consent signed and in chart.   TX duration: 3.5  Patient tolerated well.  Transported back to the room  Alert, without acute distress.  Hand-off given to patient's nurse.   Access used: Yes Access issues: No  Total UF removed: 2000 Medication(s) given: See MAR Post HD VS: See Above Grid Post HD weight: Unable to get weight   Zebedee DELENA [facility] "

## 2024-04-24 NOTE — Plan of Care (Signed)
" °  Problem: Education: Goal: Knowledge of General Education information will improve Description: Including pain rating scale, medication(s)/side effects and non-pharmacologic comfort measures Outcome: Progressing   Problem: Health Behavior/Discharge Planning: Goal: Ability to manage health-related needs will improve Outcome: Progressing   Problem: Clinical Measurements: Goal: Will remain free from infection Outcome: Progressing Goal: Diagnostic test results will improve Outcome: Progressing Goal: Respiratory complications will improve Outcome: Progressing Goal: Cardiovascular complication will be avoided Outcome: Progressing   Problem: Activity: Goal: Risk for activity intolerance will decrease Outcome: Progressing   Problem: Nutrition: Goal: Adequate nutrition will be maintained Outcome: Progressing   Problem: Coping: Goal: Level of anxiety will decrease Outcome: Progressing   Problem: Elimination: Goal: Will not experience complications related to bowel motility Outcome: Progressing Goal: Will not experience complications related to urinary retention Outcome: Progressing   Problem: Pain Managment: Goal: General experience of comfort will improve and/or be controlled Outcome: Progressing   Problem: Safety: Goal: Ability to remain free from injury will improve Outcome: Progressing   Problem: Skin Integrity: Goal: Risk for impaired skin integrity will decrease Outcome: Progressing   Problem: Education: Goal: Ability to describe self-care measures that may prevent or decrease complications (Diabetes Survival Skills Education) will improve Outcome: Progressing   Problem: Coping: Goal: Ability to adjust to condition or change in health will improve Outcome: Progressing   Problem: Fluid Volume: Goal: Ability to maintain a balanced intake and output will improve Outcome: Progressing   Problem: Health Behavior/Discharge Planning: Goal: Ability to identify and  utilize available resources and services will improve Outcome: Progressing Goal: Ability to manage health-related needs will improve Outcome: Progressing   Problem: Metabolic: Goal: Ability to maintain appropriate glucose levels will improve Outcome: Progressing   Problem: Nutritional: Goal: Maintenance of adequate nutrition will improve Outcome: Progressing Goal: Progress toward achieving an optimal weight will improve Outcome: Progressing   Problem: Skin Integrity: Goal: Risk for impaired skin integrity will decrease Outcome: Progressing   Problem: Tissue Perfusion: Goal: Adequacy of tissue perfusion will improve Outcome: Progressing   Problem: Education: Goal: Ability to describe self-care measures that may prevent or decrease complications (Diabetes Survival Skills Education) will improve Outcome: Progressing Goal: Individualized Educational Video(s) Outcome: Progressing   Problem: Coping: Goal: Ability to adjust to condition or change in health will improve Outcome: Progressing   Problem: Fluid Volume: Goal: Ability to maintain a balanced intake and output will improve Outcome: Progressing   Problem: Health Behavior/Discharge Planning: Goal: Ability to identify and utilize available resources and services will improve Outcome: Progressing Goal: Ability to manage health-related needs will improve Outcome: Progressing   Problem: Metabolic: Goal: Ability to maintain appropriate glucose levels will improve Outcome: Progressing   Problem: Nutritional: Goal: Maintenance of adequate nutrition will improve Outcome: Progressing Goal: Progress toward achieving an optimal weight will improve Outcome: Progressing   Problem: Skin Integrity: Goal: Risk for impaired skin integrity will decrease Outcome: Progressing   Problem: Tissue Perfusion: Goal: Adequacy of tissue perfusion will improve Outcome: Progressing   "

## 2024-04-24 NOTE — Discharge Summary (Signed)
 Physician Discharge Summary  Rollyn Scialdone FMW:[id number] DOB: 27-May-1950 DOA: 01/21/2024  PCP: Seabron Lenis, MD  Admit date: 01/21/2024 Discharge date: 04/24/2024  Time spent: 50 minutes  Recommendations for Outpatient Follow-up:  SNF for short-term rehab Outpatient dialysis to start 1/9 Outpatient palliative care Remove PEG tube in few months if no other complications   Discharge Diagnoses:  Principal Problem:   Esophageal rupture Prolonged respiratory failure Recurrent upper GI bleed AKI, new ESRD Abnormal LFTs Malnutrition A-fib RVR History of V. Tach History of DVT/PE   Pleural effusion   Ventilator dependence (HCC)   Acute respiratory failure with hypoxia (HCC)   Protein-calorie malnutrition, severe   Intractable pain   Pressure injury of skin   On mechanically assisted ventilation (HCC)   Hypertensive urgency   Discharge Condition: Slowly improving  Diet recommendation: Dysphagia 3, renal diet, thin liquids  Filed Weights   04/22/24 0824 04/23/24 0403 04/24/24 0441  Weight: 71.7 kg 70.7 kg 72 kg    History of present illness:  Chronically ill 74/M prolonged hospitalization from 3/10-4/3 with cardiac arrest, A-fib, DVTs, hematochezia, IVC filter, chest tubes, eventually stabilized, discharged to rehab and then went home, readmitted 10/5 with large esophageal tear 10/7 esophageal stent placed by cardiothoracic 10/15 stent reposition 10/16 extubated 10/20 reintubated with ultrasound showing left-sided pleural effusion-left-sided chest tube placed-by lights criteria exudative effusion 10/23 echo 60-65% 10/24 barium swallow showed persistent leak 10/28 transferred out of ICU 11/3 PEG tube placed 11/19 swelling of right arm showing on ultrasound SVT cephalic vein 11/22 chest x-ray?  Pneumonia done because of back pain-follow-up CT scan shows stent in place no mediastinal fluid collection small bilateral pleural effusions with bibasilar atelectasis pneumonia not  excluded-scattered clusters nodular densities predominantly right upper and right lower lobes with nodule 11 mm as previously seen?  Mets?  Infection?  Aspiration 11/26 stent removed. Esophagram negative for esophageal leak.  11/27 getting diuresis again 11/28 duodenitis/enteritis.  GI consulted. Bloody emesis added carafate   11/29 trialing clears 12/1 NV again w/ hematemesis. Lasix  held ---  esophagram performed showing patent esophagus without perforation irregularity mid esophagus?  Stent diffuse narrowing lumen distal duodenum and proximal jejunum 12/2 EGD ischemic ulcers. Impression:  - Congested, inflamed, nodular, texture changed  mucosa in the esophagus. - Gastritis, Non-bleeding duodenal ulcers with a clean ulcer  base (Forrest Class III).- Non-bleeding jejunal ulcers with a clean ulcer   base (Forrest Class III)  - Findings suggestive of ischemic ulcers. Increased carafate  and cont BID PPI  12/3 full liq diet  12/4 marked increased WOB. Acute and progressive w/ increased hypoxia and HTN. 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 12/17 off clev transition to iHD, transfer to floor  12/18: TDC placed 12/19 [name] assumed care, has distended and painful abdomen, KUB shows improvement so tube feeds resumed, Eliquis  resumed 12/22 hemoccult neg---transfused 1 U--- Eliquis  had to be held in the setting 12/23 vascular consulted secondary to lower extremities----x-ray shows congestion no pneumonia white count up-- 12/24 ABIs showed abnormal right and left toe brachial indices-no  intervention from vascular required 12/26 severe respiratory distress needing nonrebreather 12/29, EP consulted for recurrent ventricular  tachycardia, recurrent A-fib RVR> Amio gtt. 12/30, started on mexiletine 12/31, LFTs trending up, amiodarone  and mexiletine discontinued,  1/1: LFTs continue to worsen, HIDA scan ordered, palliative care consulted 1/2: HIDA scan negative for cholecystitis, holding tube feeds which could be contributing to cholestasis Restarting IV heparin  for persistent A-fib       1/3: AST ALT improving, alk phos still significantly elevated 1/5: switched IV Heparin  to Eliquis   Hospital Course:   Acute kidney injury, new ESRD Volume overload -sp CRRT 12/10-12/16, TDC placed 12/18 - ANA positive, C4 low, antihistone antibody positive, noted to have hematuria as well  - Renal biopsy 12/16 noted 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, felt to be secondary to infection  - Completed course of antibiotics  - Currently on steroids per nephrology, tapered down to 10 mg daily for 7 more days - For dialysis today, now Monday Wednesday Friday monitoring for renal recovery, making urine but no signif recovery yet - Consulted palliative care with all above ongoing issues and high risk of decompensation, wishes for full code and full scope for now - Has been CLIPPED for outpatient dialysis to start on 1/9   Nonspecific leukocytosis -Afebrile, has completed multiple rounds of antibiotics - Ongoing prednisone  could be contributing,  - with abnormal LFTs and mild abdominal tenderness chronic cholecystitis was felt to be a possibility, RUQ US  q/ GB wall thickening, CBD was normal, HIDA scan finally completed 1/2 morning, negative for acute cholecystitis   Boerhaave syndrome Recurrent upper GI bleed - 10/7 had esophageal stent placed by CT surgery - 10/15, esophageal stent repositioned - 11/26: Esophageal stent removed, no  further leak -Ischemic ulcer disease--GI and cardiothoracic aware-several CTs since 11/25---CT scan done 12/21 unremarkable, no Carafate  with HD, Maalox discontinued -Last esophagram as above 12/1 -Continue Protonix , changed to p.o. - No further bleeding now, had been on IV heparin , switch to oral Eliquis  1/5   Elevated LFT's - LFTs significantly elevated since 12/30, had not been checked for 20 days prior, could have been slowly trending up -On CT abd from 12/21 with mild biliary ductal dilation vs mild periportal edema and tiny layering stone in GB neck - Right upper quadrant ultrasound was nonspecific, ascites and gallbladder wall thickening, CBD was normal, has mild epigastric and right sided tenderness on exam, HIDA scan negative for cholecystitis 1/2 - Held tube feeds in case that could be contributing to cholestasis - Discussed with GI, amiodarone  discontinued 12/31, 1/1 mexiletine was discontinued due to predominant hepatic metabolism - off lipitor  and Tylenol  - Suspect could have had a shock liver type injury BP dropped to 90s 12/28-29, versus passive hepatic congestion from volume overloaded state, LFTs slowly improving, alk phos still significantly elevated however no obstructive pattern on imaging, discussed with [facility] again 1/6, recommended autoimmune hepatitis panel and MRCP, MRCP negative for obstructive pathology - Fortunately continues to slowly improve   Severe Malnutrition-BMI 22 evidence of weight loss -S/p PEG in the setting of esophageal tear, stents and ischemic ulcers - Had been on tube feeds for few months, now discontinued -Tolerating dysphagia 3 diet, PEG tube can be removed in a few months if he does not have other complications   Acute on chronic blood loss anemia Anemia of chronic disease -Transfused PRBC x 1u in October, x1 unit 03/29/2024,12/23, 12/28 -Hemoglobin now stable in the mid 7-8 range   Cardiac arrest 06/2023 with HF MR EF 55-65% 03/27/2024    A-fib RVR CHADVASC >4 on  Eliquis  [cardioverted 06/2023]--- recurrent RVR at times -Eliquis  has been held since 12/22  -cardiology started following again for recurrent A-fib RVR -Now on low-dose Coreg , amiodarone  and mexiletine discontinued 12/31 with abnormal LFTs - Started IV heparin  1/2, switch to Eliquis  1/6   DVT PE March 2025 -does have IVC filter in place since March Lower limb ischemia ruled out 12.23 ABI rules out ischemia-appreciate VVS--no intervention needed - Now back on anticoagulation, Eliquis  resumed   Insomnia - Continued on regimen of trazodone  Seroquel  and hydroxyzine    Diabetes - CBG stable   Multiple bedsores and wounds anterior right and left foot and injury stage I to bottom -Continue Gerhardt twice daily Lidoderm  patch for back pain   VT - recurrent runs of VT 12/28 - Cards was following, amiodarone  and mexiletine discontinued with rising LFTs, continue low-dose Coreg , recommended Toprol  if he has recurrence, no further recurrence at this time   Lung nodules-repeat CT in 1-20months     Discharge Exam: Vitals:   04/24/24 0441 04/24/24 0933  BP: 122/70 (!) 140/71  Pulse: 70 88  Resp: 20   Temp: 98.4 F (36.9 C)   SpO2: 100% 93%   General exam: Chronically ill-appearing, AAO x 2, no distress, irritable this morning HEENT:  right IJ HD catheter CVS: S1-S2, irregular rhythm Lungs: Clear anteriorly, decreased breath sounds at the bases Abdomen: Mildly distended, soft, nontender, PEG tube noted Extremities: 1+ edema Psychiatry: Flat affect    Discharge Instructions    Allergies as of 04/24/2024       Reactions   Hydralazine  Hcl    Concern for drug induced lupus   Norvasc  [amlodipine ] Swelling, Other (See Comments)   Excessive BLE swelling Skin discoloration, blotching   Jardiance  [empagliflozin ] Itching, Other (See Comments)   Patient mentioned penile swelling, pain   Morphine  And Codeine Itching   Opioid-induced pruritus         Medication List     STOP taking these medications    ezetimibe  10 MG tablet Commonly known as: Zetia    ibuprofen 200 MG tablet Commonly known as: ADVIL   sacubitril -valsartan  97-103 MG Commonly known as: ENTRESTO        TAKE these medications    atorvastatin  80 MG tablet Commonly known as: LIPITOR  Take 1 tablet (80 mg total) by mouth daily. What changed: when to take this   carvedilol  3.125 MG tablet Commonly known as: COREG  Take 1 tablet (3.125 mg total) by mouth 2 (two) times daily with a meal.   clonazePAM  0.5 MG tablet Commonly known as: KLONOPIN  Place 1 tablet (0.5 mg total) into feeding tube 2 (two) times daily as needed for anxiety.   Eliquis  5 MG Tabs tablet Generic drug: apixaban  TAKE 1 TABLET BY MOUTH TWICE A DAY   Mens 50+ Multivitamin Tabs Take 1 tablet by mouth daily.   oxyCODONE  5 MG immediate release tablet Commonly known as: Oxy IR/ROXICODONE  Take 1 tablet (5 mg total) by mouth every 4 (four) hours as needed for moderate pain (pain score 4-6).   pantoprazole  40 MG tablet Commonly known as: PROTONIX  Take 1 tablet (40 mg total) by mouth 2 (two) times daily.   polyethylene glycol 17 g packet Commonly known as: MIRALAX  / GLYCOLAX  Take 17 g by mouth daily as needed for mild constipation.   predniSONE  10 MG tablet Commonly known as: DELTASONE  Take 1 tablet (10 mg total) by mouth daily with breakfast for 7 days. Start taking on: April 25, 2024   saccharomyces boulardii 250 MG capsule  Commonly known as: FLORASTOR Take 1 capsule (250 mg total) by mouth 2 (two) times daily.   Torsemide  60 MG Tabs Take 60 mg by mouth daily. What changed:  medication strength how much to take   traZODone  50 MG tablet Commonly known as: DESYREL  Take 1 tablet (50 mg total) by mouth at bedtime as needed for sleep.       Allergies[1]  Follow-up Information     Lightfoot, Linnie KIDD, MD Follow up.   Specialty: Cardiothoracic Surgery Why: Please see  discharge paperwork for details of follow-up appointment with Dr. Shyrl. Contact information: [address], [place] [address] [phone]         [facility] [address]  [place] (DME) [facility] Follow up.   Specialty: DME Services Why: Nancylee will call you to schedule first home visit. Contact information: [address] [place]  [phone]        [facility] Follow up.   Specialty: Home Health Services Why: Someone will call you to schedule first home visit. Contact information: [address] [place] [address] [phone]         [facility] Follow up on 03/20/2024.   Specialty: Cardiology Why: [facility] 10:30 AM Contact information: [address] [place]  [phone]        [facility] Go on 04/26/2024.   Why: Please arrive 10:45am for first day.   after first day, you will go here for dialysis every monday, wednesday, and friday at 11:25am. Contact information: [address] [place] [address] [phone]                  The results of significant diagnostics from this hospitalization (including imaging, microbiology, ancillary and laboratory) are listed below for reference.    Significant Diagnostic Studies: MR ABDOMEN MRCP W WO CONTRAST Result Date: 04/24/2024 CLINICAL DATA:  Abnormal LFTs. EXAM: MRI ABDOMEN WITHOUT AND WITH CONTRAST (INCLUDING MRCP) TECHNIQUE: Multiplanar multisequence MR imaging of the abdomen was performed both before and after the administration of intravenous contrast. Heavily T2-weighted images of the biliary and pancreatic ducts were obtained, and three-dimensional MRCP images were rendered by post processing. CONTRAST:  7mL GADAVIST  GADOBUTROL  1 MMOL/ML IV SOLN COMPARISON:  Abdominal ultrasound  04/16/2024. FINDINGS: Lower chest: Small bilateral pleural effusions. Hepatobiliary: No focal abnormality within the liver. Gallbladder is distended with tiny 1-2 mm layering stones (axial 12/4). No intra or extrahepatic biliary duct dilatation. Extrahepatic common bile duct measures 6 mm diameter, upper normal. No evidence for choledocholithiasis. Pancreas: No focal mass lesion. No dilatation of the main duct. No intraparenchymal cyst. No peripancreatic edema. Spleen:  No splenomegaly. No suspicious focal mass lesion. Adrenals/Urinary Tract: No adrenal nodule or mass. Left kidney markedly atrophic. Right kidney unremarkable. Stomach/Bowel: Gastrostomy tube evident. Stomach is nondistended. Duodenum is normally positioned as is the ligament of Treitz. No small bowel or colonic dilatation within the visualized abdomen. Vascular/Lymphatic: No abdominal aortic aneurysm. Linear band of hypointense signal identified in the lumen of the distal abdominal aorta compatible with the densely calcified finding on CT of 04/07/2024 potentially calcified mural thrombus or chronic focal dissection. No abdominal lymphadenopathy Other:  No substantial intraperitoneal free fluid. Musculoskeletal: No suspicious marrow enhancement. IMPRESSION: 1. Cholelithiasis with tiny 1-2 mm layering stones in the gallbladder lumen. No intra or extrahepatic biliary duct dilatation. No evidence for choledocholithiasis. 2. Small  bilateral pleural effusions. 3. Markedly atrophic left kidney. Electronically Signed   By: Camellia Candle M.D.   On: 04/24/2024 05:24   MR 3D Recon At Scanner Result Date: 04/24/2024 CLINICAL DATA:  Abnormal LFTs. EXAM: MRI ABDOMEN WITHOUT AND WITH CONTRAST (INCLUDING MRCP) TECHNIQUE: Multiplanar multisequence MR imaging of the abdomen was performed both before and after the administration of intravenous contrast. Heavily T2-weighted images of the biliary and pancreatic ducts were obtained, and three-dimensional MRCP images  were rendered by post processing. CONTRAST:  7mL GADAVIST  GADOBUTROL  1 MMOL/ML IV SOLN COMPARISON:  Abdominal ultrasound 04/16/2024. FINDINGS: Lower chest: Small bilateral pleural effusions. Hepatobiliary: No focal abnormality within the liver. Gallbladder is distended with tiny 1-2 mm layering stones (axial 12/4). No intra or extrahepatic biliary duct dilatation. Extrahepatic common bile duct measures 6 mm diameter, upper normal. No evidence for choledocholithiasis. Pancreas: No focal mass lesion. No dilatation of the main duct. No intraparenchymal cyst. No peripancreatic edema. Spleen:  No splenomegaly. No suspicious focal mass lesion. Adrenals/Urinary Tract: No adrenal nodule or mass. Left kidney markedly atrophic. Right kidney unremarkable. Stomach/Bowel: Gastrostomy tube evident. Stomach is nondistended. Duodenum is normally positioned as is the ligament of Treitz. No small bowel or colonic dilatation within the visualized abdomen. Vascular/Lymphatic: No abdominal aortic aneurysm. Linear band of hypointense signal identified in the lumen of the distal abdominal aorta compatible with the densely calcified finding on CT of 04/07/2024 potentially calcified mural thrombus or chronic focal dissection. No abdominal lymphadenopathy Other:  No substantial intraperitoneal free fluid. Musculoskeletal: No suspicious marrow enhancement. IMPRESSION: 1. Cholelithiasis with tiny 1-2 mm layering stones in the gallbladder lumen. No intra or extrahepatic biliary duct dilatation. No evidence for choledocholithiasis. 2. Small bilateral pleural effusions. 3. Markedly atrophic left kidney. Electronically Signed   By: Camellia Candle M.D.   On: 04/24/2024 05:24   NM Hepatobiliary Liver Func Result Date: 04/19/2024 EXAM: NM HEPATOBILLARY SCAN 04/19/2024 11:18:01 AM TECHNIQUE: RADIOPHARMACEUTICAL: 5.2 mCi Tc-67m mebrofenin  Dynamic images of the abdomen and pelvis were obtained in the anterior projection for 1 hour after intravenous  administration of radiopharmaceutical. COMPARISON: Abdominal sonogram dated 04/16/2024. CLINICAL HISTORY: Cholecystitis. FINDINGS: Homogenous uptake within the liver. Normal clearance of the blood pool. Appropriate excretion into the biliary system. Activity is visualized in the gallbladder. IMPRESSION: 1. No scintigraphic evidence of acute cholecystitis. Electronically signed by: Evalene Coho MD 04/19/2024 12:28 PM EST RP Workstation: [id number]   US  Abdomen Limited RUQ (LIVER/GB) Result Date: 04/17/2024 CLINICAL DATA:  Elevated LFTs. EXAM: ULTRASOUND ABDOMEN LIMITED RIGHT UPPER QUADRANT COMPARISON:  CT 04/07/2024 FINDINGS: Gallbladder: Physiologically distended. No definite gallstones seen on the current exam. Echogenicity in the gallbladder neck which is difficult to accurately assess on the current exam, suspect sludge. Mild gallbladder wall thickening at 3.8 mm. No sonographic Murphy sign noted by sonographer. Common bile duct: Diameter: 4-5 mm, normal. Liver: No focal lesion identified. Mildly heterogeneous in parenchymal echogenicity. Portal vein is patent on color Doppler imaging with normal direction of blood flow towards the liver. Other: Trace right upper quadrant ascites. Right pleural effusion noted. Patient had difficulty with positioning. IMPRESSION: 1. Nonspecific gallbladder wall thickening in the setting of ascites. No specific findings of acute cholecystitis. 2. Suspect layering sludge in the gallbladder neck. 3. Trace right upper quadrant ascites. Electronically Signed   By: Andrea Gasman M.D.   On: 04/17/2024 10:00   ECHOCARDIOGRAM LIMITED Result Date: 04/15/2024    ECHOCARDIOGRAM LIMITED REPORT   Patient Name:   Saman Hook Date of Exam: 04/15/2024 Medical Rec #:  [id number]  Height:       73.0 in Accession #:    [id number] Weight:       167.5 lb Date of Birth:  [birth year]  BSA:          1.996 m Patient Age:    73 years   BP:           161/70 mmHg Patient Gender: M          HR:            83 bpm. Exam Location:  Inpatient Procedure: Limited Echo Indications:    Ventricular Tachycardia I47.2  History:        Patient has prior history of Echocardiogram examinations, most                 recent 03/27/2024. Arrythmias:Atrial Fibrillation; Risk                 Factors:Hypertension.  Sonographer:    Jayson Gaskins Referring Phys: [id number] RENEE LYNN URSUY IMPRESSIONS  1. Left ventricular ejection fraction, by estimation, is 60 to 65%. The left ventricle has normal function. There is mild concentric left ventricular hypertrophy. Left ventricular diastolic function could not be evaluated.  2. Right ventricular systolic function is normal. The right ventricular size is normal.  3. The mitral valve is normal in structure. No evidence of mitral valve regurgitation.  4. The aortic valve is tricuspid. Aortic valve regurgitation is not visualized. Aortic valve sclerosis/calcification is present, without any evidence of aortic stenosis.  5. The inferior vena cava is normal in size with greater than 50% respiratory variability, suggesting right atrial pressure of 3 mmHg. FINDINGS  Left Ventricle: Left ventricular ejection fraction, by estimation, is 60 to 65%. The left ventricle has normal function. There is mild concentric left ventricular hypertrophy. Left ventricular diastolic function could not be evaluated. Left ventricular diastolic function could not be evaluated due to nondiagnostic images. Right Ventricle: The right ventricular size is normal. Right ventricular systolic function is normal. Pericardium: There is no evidence of pericardial effusion. Mitral Valve: The mitral valve is normal in structure. Aortic Valve: The aortic valve is tricuspid. Aortic valve regurgitation is not visualized. Aortic valve sclerosis/calcification is present, without any evidence of aortic stenosis. Aorta: The aortic root is normal in size and structure. Venous: The inferior vena cava is normal in size with greater than 50%  respiratory variability, suggesting right atrial pressure of 3 mmHg. LEFT VENTRICLE PLAX 2D LVIDd:         4.60 cm LVIDs:         3.30 cm LV PW:         1.20 cm LV IVS:        1.20 cm LVOT diam:     2.10 cm LVOT Area:     3.46 cm   SHUNTS Systemic Diam: 2.10 cm Morene Brownie Electronically signed by Morene Brownie Signature Date/Time: 04/15/2024/5:56:35 PM    Final    DG CHEST PORT 1 VIEW Result Date: 04/15/2024 EXAM: 1 VIEW(S) XRAY OF THE CHEST 04/15/2024 07:56:00 AM COMPARISON: 04/14/2024. CLINICAL HISTORY: Acute respiratory distress. FINDINGS: LINES, TUBES AND DEVICES: Right arm PICC line tip is in the projection of the SVC. There is also a right IJ dialysis catheter with tip at the superior cavoatrial junction. Loop recorder is noted in the projection of the left heart border. LUNGS AND PLEURA: Unchanged small bilateral pleural effusions. Persistent bilateral interstitial and airspace opacities. No pneumothorax. HEART AND MEDIASTINUM: No acute abnormality  of the cardiac and mediastinal silhouettes. BONES AND SOFT TISSUES: No acute osseous abnormality. IMPRESSION: 1. Persistent bilateral interstitial and airspace opacities. 2. Unchanged small bilateral pleural effusions. Electronically signed by: Waddell Calk MD 04/15/2024 08:07 AM EST RP Workstation: [id number]   DG Chest Port 1 View Result Date: 04/14/2024 CLINICAL DATA:  Pulmonary edema. EXAM: PORTABLE CHEST 1 VIEW COMPARISON:  Radiograph yesterday FINDINGS: Stable positioning of right-sided dialysis catheter and right upper extremity PICC. Slight improvement in heterogeneous interstitial opacities. Cardiomegaly is stable. Small pleural effusions are unchanged. Left chest wall loop recorder. No pneumothorax. IMPRESSION: 1. Slight improvement in heterogeneous interstitial opacities, likely improving pulmonary edema. 2. Stable small pleural effusions. Electronically Signed   By: Andrea Gasman M.D.   On: 04/14/2024 16:42   DG CHEST PORT 1  VIEW Result Date: 04/13/2024 CLINICAL DATA:  Pneumonia. EXAM: PORTABLE CHEST 1 VIEW COMPARISON:  04/12/2024 FINDINGS: Patchy bilateral airspace disease is similar to prior. Tiny effusions again noted. The cardio pericardial silhouette is enlarged. Right IJ central line tip overlies the distal SVC level. Right PICC line tip is stable over the mid to distal SVC. IMPRESSION: No substantial interval change. Patchy bilateral airspace disease with tiny effusions. Electronically Signed   By: Camellia Candle M.D.   On: 04/13/2024 09:13   DG CHEST PORT 1 VIEW Result Date: 04/13/2024 CLINICAL DATA:  Pneumonia. EXAM: PORTABLE CHEST 1 VIEW COMPARISON:  04/12/2024 FINDINGS: Similar patchy bilateral airspace disease scattered relatively diffusely in both lungs. Tiny pleural effusions again noted. Right IJ central line tip overlies the low SVC level. Right PICC line tip projects at the mid SVC level. The cardio pericardial silhouette is mildly enlarged. Telemetry leads overlie the chest. IMPRESSION: 1. No substantial interval change in patchy bilateral airspace disease. 2. Tiny bilateral pleural effusions. Electronically Signed   By: Camellia Candle M.D.   On: 04/13/2024 05:56   DG Chest Port 1 View Result Date: 04/12/2024 EXAM: 1 VIEW(S) XRAY OF THE CHEST 04/12/2024 06:45:00 AM COMPARISON: 04/09/2024 CLINICAL HISTORY: 74 year old male with acute respiratory distress. FINDINGS: LINES, TUBES AND DEVICES: Left chest cardiac loop recorder noted. Stable right IJ approach dual lumen catheter in place. Stable right PICC line in place. LUNGS AND PLEURA: Worsening patchy and confluent bilateral pulmonary opacity, most confluent in the right upper lobe. Small bilateral pleural effusions. No pneumothorax. HEART AND MEDIASTINUM: Left chest cardiac loop recorder noted. No acute abnormality of the cardiac and mediastinal silhouettes. BONES AND SOFT TISSUES: Paucity of bowel gas. No acute osseous abnormality. IMPRESSION: 1. Worsening  bilateral patchy confluent opacities, greatest RUL. Small bilateral pleural effusions. Consider progressive pulmonary edema or bilateral infection. Electronically signed by: Helayne Hurst MD 04/12/2024 06:57 AM EST RP Workstation: [id number]   VAS US  ABI WITH/WO TBI Result Date: 04/10/2024  LOWER EXTREMITY DOPPLER STUDY Patient Name:  Pio Sirianni  Date of Exam:   04/10/2024 Medical Rec #: [id number]   Accession #:    [id number] Date of Birth: 1951/03/09   Patient Gender: M Patient Age:   30 years Exam Location:  [facility] Procedure:      VAS US  ABI WITH/WO TBI Referring Phys: NORMAN SERVE --------------------------------------------------------------------------------  Indications: Peripheral artery disease. High Risk Factors: Hypertension, hyperlipidemia.  Comparison Study: No prior studies. Performing Technologist: Gerome Ny RVT  Examination Guidelines: A complete evaluation includes at minimum, Doppler waveform signals and systolic blood pressure reading at the level of bilateral brachial, anterior tibial, and posterior tibial arteries, when vessel segments are accessible. Bilateral testing is considered  an integral part of a complete examination. Photoelectric Plethysmograph (PPG) waveforms and toe systolic pressure readings are included as required and additional duplex testing as needed. Limited examinations for reoccurring indications may be performed as noted.  ABI Findings: +---------+------------------+-----+-----------+----------+ Right    Rt Pressure (mmHg)IndexWaveform   Comment    +---------+------------------+-----+-----------+----------+ Brachial                                   Restricted +---------+------------------+-----+-----------+----------+ PTA      111               1.01 multiphasic           +---------+------------------+-----+-----------+----------+ DP       112               1.02 multiphasic            +---------+------------------+-----+-----------+----------+ Great Toe61                0.55                       +---------+------------------+-----+-----------+----------+ +---------+------------------+-----+-----------+-------+ Left     Lt Pressure (mmHg)IndexWaveform   Comment +---------+------------------+-----+-----------+-------+ Brachial 110                    triphasic          +---------+------------------+-----+-----------+-------+ PTA      109               0.99 multiphasic        +---------+------------------+-----+-----------+-------+ DP       104               0.95 multiphasic        +---------+------------------+-----+-----------+-------+ Great Toe73                0.66                    +---------+------------------+-----+-----------+-------+ +-------+-----------+-----------+------------+------------+ ABI/TBIToday's ABIToday's TBIPrevious ABIPrevious TBI +-------+-----------+-----------+------------+------------+ Right  1.02       0.55                                +-------+-----------+-----------+------------+------------+ Left   0.99       0.66                                +-------+-----------+-----------+------------+------------+  Summary: Right: Resting right ankle-brachial index is within normal range. The right toe-brachial index is abnormal.  Left: Resting left ankle-brachial index is within normal range. The left toe-brachial index is abnormal.  *See table(s) above for measurements and observations.  Electronically signed by Debby Robertson on 04/10/2024 at 7:24:19 PM.    Final    DG CHEST PORT 1 VIEW Result Date: 04/09/2024 CLINICAL DATA:  Pneumonia. EXAM: PORTABLE CHEST 1 VIEW COMPARISON:  03/30/2024 FINDINGS: Right jugular central venous catheter is seen with tip at the superior cavoatrial junction. No pneumothorax visualized. Stable mild cardiomegaly. Small bilateral pleural effusions again seen, with decreased size on the  right. The scarring again seen in the lateral right lung base. Mixed diffuse interstitial infiltrates and patchy airspace opacities show no significant change, and may be due to edema or infectious/inflammatory etiologies. No new or worsening areas of pulmonary opacity are seen. IMPRESSION: No significant change in mixed  diffuse interstitial infiltrates and patchy airspace opacities. Small bilateral pleural effusions, with decreased size on the right. Electronically Signed   By: Norleen DELENA Kil M.D.   On: 04/09/2024 09:30   CT ABDOMEN PELVIS W CONTRAST Result Date: 04/07/2024 CLINICAL DATA:  Peritonitis or perforation suspected. EXAM: CT ABDOMEN AND PELVIS WITH CONTRAST TECHNIQUE: Multidetector CT imaging of the abdomen and pelvis was performed using the standard protocol following bolus administration of intravenous contrast. RADIATION DOSE REDUCTION: This exam was performed according to the departmental dose-optimization program which includes automated exposure control, adjustment of the mA and/or kV according to patient size and/or use of iterative reconstruction technique. CONTRAST:  75mL OMNIPAQUE  IOHEXOL  350 MG/ML SOLN COMPARISON:  CT abdomen pelvis dated 03/24/2024. FINDINGS: Lower chest: Partially visualized small bilateral pleural effusions with partial compressive atelectasis of the lower lobes. Mild cardiomegaly. No intra-abdominal free air.  Small ascites. Hepatobiliary: The liver is unremarkable. There is mild biliary ductal dilatation versus mild periportal edema. Tiny layering stone noted in the gallbladder neck. No pericholecystic fluid or evidence of acute cholecystitis by CT. Pancreas: Unremarkable. No pancreatic ductal dilatation or surrounding inflammatory changes. Spleen: Normal in size without focal abnormality. Adrenals/Urinary Tract: The adrenal glands unremarkable. The left kidney is atrophic. The right kidney is unremarkable. The visualized ureters appear unremarkable. The urinary bladder  is decompressed around a Foley catheter. 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 There is moderate stool throughout the colon. There is no bowel obstruction or active inflammation. The appendix is normal. Vascular/Lymphatic: Moderate aortoiliac atherosclerotic disease. The IVC is unremarkable. An infrarenal IVC filter is noted. No portal venous gas. The SMV, splenic vein, and main portal vein are patent. There is no adenopathy. Reproductive: The prostate gland is grossly unremarkable. Other: There is diffuse subcutaneous edema and anasarca. Musculoskeletal: No acute osseous pathology. Degenerative changes of the spine. IMPRESSION: 1. No bowel obstruction. Normal appendix. 2. Small ascites and anasarca. 3. Partially visualized small bilateral pleural effusions with partial compressive atelectasis of the lower lobes. 4. Cholelithiasis. 5.  Aortic Atherosclerosis (ICD10-I70.0). Electronically Signed   By: Vanetta Chou M.D.   On: 04/07/2024 16:14   DG Abd 1 View Result Date: 04/05/2024 CLINICAL DATA:  Abdominal distension.  Pain, nausea. EXAM: ABDOMEN - 1 VIEW COMPARISON:  Radiograph 04/02/2024 FINDINGS: Gastrostomy tube projects over the left upper quadrant. IVC filter in place. Persistent but improving gaseous bowel distension, remaining gaseous distention is primarily colon. No evidence of free air. Bilateral pleural effusions and bibasilar airspace disease, similar to prior. IMPRESSION: 1. Persistent but improving gaseous bowel distension, remaining gaseous distention is primarily colon. 2. Bilateral pleural effusions and bibasilar airspace disease, similar to prior. Electronically Signed   By: Andrea Gasman M.D.   On: 04/05/2024 21:12   IR TUNNELED CENTRAL VENOUS CATH [facility] W IMG Result Date: 04/04/2024 INDICATION: 74 year old with acute on chronic kidney disease. EXAM: FLUOROSCOPIC AND ULTRASOUND GUIDED PLACEMENT OF A TUNNELED DIALYSIS CATHETER Physician: [name] SAUNDERS. Philip, MD MEDICATIONS: Ancef  2 g; The antibiotic was administered within an appropriate time interval prior to skin puncture. ANESTHESIA/SEDATION: Moderate (conscious) sedation was employed during this procedure. A total of Versed  1 mg and fentanyl  75 mcg was administered intravenously at the order of the provider performing the procedure. Total intra-service moderate sedation time: 23 minutes. Patient's level of consciousness and vital signs were monitored continuously by radiology nurse throughout the procedure under the supervision of the provider performing the procedure. FLUOROSCOPY TIME:  Radiation Exposure Index (as provided by  the fluoroscopic device): 9 mGy Kerma COMPLICATIONS: None immediate. PROCEDURE: Informed consent was obtained for placement of a tunneled dialysis catheter. The patient was placed supine on the interventional table. Ultrasound confirmed a patent right internal jugular vein. Ultrasound image obtained for documentation. The right neck and chest was prepped and draped in a sterile fashion. Maximal barrier sterile technique was utilized including caps, mask, sterile gowns, sterile gloves, sterile drape, hand hygiene and skin antiseptic. The right neck was anesthetized with 1% lidocaine . A small incision was made with #11 blade scalpel. A 21 gauge needle directed into the right internal jugular vein with ultrasound guidance. A micropuncture dilator set was placed. A 23 cm tip to cuff Palindrome catheter was selected. The skin below the right clavicle was anesthetized and a small incision was made with an #11 blade scalpel. A subcutaneous tunnel was formed to the vein dermatotomy site. The catheter was brought through the tunnel. The vein dermatotomy site was dilated to accommodate a peel-away sheath over a wire. The catheter was placed through the peel-away sheath and directed into the central venous structures. The tip of the catheter was placed at superior cavoatrial junction with  fluoroscopy. Fluoroscopic images were obtained for documentation. Both lumens were found to aspirate and flush well. The proper amount of heparin  was flushed in both lumens. The vein dermatotomy site was closed using a single layer of absorbable suture and Dermabond. Gel-Foam was placed in the subcutaneous tract. The catheter was secured to the skin using Prolene suture. IMPRESSION: Successful placement of a right jugular tunneled dialysis catheter using ultrasound and fluoroscopic guidance. Electronically Signed   By: [name] Balder M.D.   On: 04/04/2024 17:01   US  RENAL Result Date: 04/03/2024 EXAM: US  Retroperitoneum Complete, Renal. 04/03/2024 08:51:48 AM TECHNIQUE: Real-time ultrasonography of the retroperitoneum renal was performed. COMPARISON: US  Renal 03/26/2024; 07/04/2023. CLINICAL HISTORY: Abdominal pain. FINDINGS: FINDINGS: RIGHT KIDNEY/URETER: Right kidney measures 10.8 x 5.8 x 5.3 cm. Normal cortical echogenicity. No hydronephrosis. No calculus. No mass. LEFT KIDNEY/URETER: Left kidney measures 7.5 x 3.9 x 2.8 cm. The left kidney is relatively atrophic and echogenic, but is suboptimally visualized. No hydronephrosis. No calculus. No mass. BLADDER: Unremarkable appearance of the bladder. IMPRESSION: 1. Relatively atrophic and echogenic left kidney, suboptimally visualized. Electronically signed by: Evalene Coho MD 04/03/2024 09:14 AM EST RP Workstation: [id number]   DG Abd 1 View Result Date: 04/02/2024 CLINICAL DATA:  [id number] Abdominal pain [id number] EXAM: ABDOMEN - 1 VIEW COMPARISON:  March 30, 2024 FINDINGS: Similar gaseous distension of the small bowel and colon.Percutaneous gastrostomy tube noted overlying the stomach.No pneumoperitoneum. No organomegaly or radiopaque calculi. No acute fracture or destructive lesion. Similar changes in the lung bases. IVC filter again noted apex to the right of L2.Temperature probe overlying the midline pelvis. Mild bilateral hip osteoarthritis. Multilevel  degenerative disc disease of the spine. IMPRESSION: Diffuse gaseous distention of the small bowel and colon, as can be seen in an adynamic ileus. Continued close radiographic follow-up recommended. Electronically Signed   By: Rogelia Myers M.D.   On: 04/02/2024 18:42   US  BIOPSY (KIDNEY) Result Date: 04/02/2024 INDICATION: 74 year old with acute on chronic kidney disease. Concern for vasculitis or autoimmune process. Left kidney is atrophic. EXAM: ULTRASOUND-GUIDED RANDOM RENAL BIOPSY MEDICATIONS: Moderate sedation ANESTHESIA/SEDATION: Moderate (conscious) sedation was employed during this procedure. A total of Versed  1 mg and Fentanyl  50 mcg was administered intravenously by the radiology nurse. Total intra-service moderate Sedation Time: 15 minutes. The patient's level of consciousness and vital signs  were monitored continuously by radiology nursing throughout the procedure under my direct supervision. FLUOROSCOPY TIME:  None COMPLICATIONS: None immediate. PROCEDURE: Informed written consent was obtained from the patient after a thorough discussion of the procedural risks, benefits and alternatives. All questions were addressed. A timeout was performed prior to the initiation of the procedure. Patient was placed prone. Right flank was evaluated with ultrasound. Right kidney was selected for biopsy. Right flank was prepped and draped in sterile fashion. Maximal barrier sterile technique was utilized including caps, mask, sterile gowns, sterile gloves, sterile drape, hand hygiene and skin antiseptic. Skin was anesthetized using 1% lidocaine . Small incision was made. Using ultrasound guidance, a 17 gauge coaxial needle was directed to the upper pole cortex. Two core biopsies were obtained from the upper pole cortex with an 18 gauge core device. Specimens placed in saline. Gel-Foam slurry was injected as the 17 gauge coaxial needle was retracted. Bandage placed over the puncture site. FINDINGS: Right kidney has  an unusual orientation and upper pole is most posterior and accessible for biopsy. Two adequate core specimens obtained from the right kidney upper pole. No immediate bleeding or hematoma formation. IMPRESSION: Successful ultrasound-guided right renal biopsy. Electronically Signed   By: [name] Balder M.D.   On: 04/02/2024 14:21   DG Abd Portable 1V Result Date: 03/30/2024 CLINICAL DATA:  Abdominal pain. EXAM: PORTABLE ABDOMEN - 1 VIEW COMPARISON:  Abdomen and pelvis CT dated 03/24/2024. FINDINGS: Normal bowel-gas pattern. PEG tube in place. Mild bilateral hip degenerative changes. Moderate lumbar spine degenerative changes. Inferior vena cava filter in the expected position on the right. Chest findings described in the separate chest report. IMPRESSION: No acute abdominal abnormality. Electronically Signed   By: Elspeth Bathe M.D.   On: 03/30/2024 11:26   DG CHEST PORT 1 VIEW Result Date: 03/30/2024 CLINICAL DATA:  Follow-up bilateral pleural effusions and lung opacities. EXAM: PORTABLE CHEST 1 VIEW COMPARISON:  03/29/2024 FINDINGS: The endotracheal tube has been removed. A right jugular catheter remains in place with its tip in the superior vena cava. A right PICC remains in place with its tip in the region of the superior cavoatrial junction. Minimal left pleural effusion, decreased. Stable small right lateral pleural effusion. No significant change in patchy densities in the right lower lung zone and left mid lung zone. Borderline enlarged cardiac silhouette. Tortuous and partially calcified thoracic aorta. Left neck surgical clips. Unremarkable bones. IMPRESSION: 1. Minimal left pleural effusion, decreased. 2. Stable small right lateral pleural effusion. 3. Stable patchy densities in the right lower lung zone and left mid lung zone, suspicious for pneumonia. Electronically Signed   By: Elspeth Bathe M.D.   On: 03/30/2024 11:24   ECHOCARDIOGRAM COMPLETE Result Date: 03/29/2024    ECHOCARDIOGRAM REPORT    Patient Name:   Rowe Medina Date of Exam: 03/27/2024 Medical Rec #:  [id number]  Height:       73.0 in Accession #:    [id number] Weight:       202.4 lb Date of Birth:  1951/01/31  BSA:          2.162 m Patient Age:    73 years   BP:           119/52 mmHg Patient Gender: M          HR:           56 bpm. Exam Location:  Inpatient Procedure: 2D Echo, Cardiac Doppler and Color Doppler (Both Spectral and Color  Flow Doppler were utilized during procedure). Indications:    CHF I50.9  History:        Patient has prior history of Echocardiogram examinations, most                 recent 02/08/2024. CHF, CAD and Previous Myocardial Infarction,                 TIA, CKD, stage 3 and Stroke, Arrythmias:Atrial Fibrillation;                 Risk Factors:Hypertension and Dyslipidemia.  Sonographer:    Thea Norlander RCS Referring Phys: [phone] DALTON S MCLEAN IMPRESSIONS  1. Left ventricular ejection fraction, by estimation, is 55 to 60%. Left ventricular ejection fraction by 2D MOD biplane is 59.1 %. The left ventricle has normal function. The left ventricle demonstrates regional wall motion abnormalities with basal inferior akinesis and basal inferolateral akinesis. There is moderate concentric left ventricular hypertrophy. Left ventricular diastolic parameters are consistent with Grade II diastolic dysfunction (pseudonormalization).  2. Right ventricular systolic function is mildly reduced. The right ventricular size is normal. Tricuspid regurgitation signal is inadequate for assessing PA pressure.  3. The mitral valve is normal in structure. No evidence of mitral valve regurgitation. No evidence of mitral stenosis.  4. The aortic valve is tricuspid. There is moderate calcification of the aortic valve. Aortic valve regurgitation is not visualized. Aortic valve sclerosis/calcification is present, without any evidence of aortic stenosis.  5. The inferior vena cava is normal in size with greater than 50% respiratory  variability, suggesting right atrial pressure of 3 mmHg. FINDINGS  Left Ventricle: Left ventricular ejection fraction, by estimation, is 55 to 60%. Left ventricular ejection fraction by 2D MOD biplane is 59.1 %. The left ventricle has normal function. The left ventricle demonstrates regional wall motion abnormalities. The left ventricular internal cavity size was normal in size. There is moderate concentric left ventricular hypertrophy. Left ventricular diastolic parameters are consistent with Grade II diastolic dysfunction (pseudonormalization). Right Ventricle: The right ventricular size is normal. No increase in right ventricular wall thickness. Right ventricular systolic function is mildly reduced. Tricuspid regurgitation signal is inadequate for assessing PA pressure. Left Atrium: Left atrial size was normal in size. Right Atrium: Right atrial size was normal in size. Pericardium: There is no evidence of pericardial effusion. Mitral Valve: The mitral valve is normal in structure. Mild mitral annular calcification. No evidence of mitral valve regurgitation. No evidence of mitral valve stenosis. Tricuspid Valve: The tricuspid valve is normal in structure. Tricuspid valve regurgitation is trivial. Aortic Valve: The aortic valve is tricuspid. There is moderate calcification of the aortic valve. Aortic valve regurgitation is not visualized. Aortic valve sclerosis/calcification is present, without any evidence of aortic stenosis. Aortic valve peak gradient measures 10.5 mmHg. Pulmonic Valve: The pulmonic valve was normal in structure. Pulmonic valve regurgitation is trivial. Aorta: The aortic root is normal in size and structure. Venous: The inferior vena cava is normal in size with greater than 50% respiratory variability, suggesting right atrial pressure of 3 mmHg. IAS/Shunts: No atrial level shunt detected by color flow Doppler.  LEFT VENTRICLE PLAX 2D                        Biplane EF (MOD) LVIDd:         5.10 cm          LV Biplane EF:   Left LVIDs:  3.30 cm                          ventricular LV PW:         1.10 cm                          ejection LV IVS:        1.30 cm                          fraction by LVOT diam:     2.10 cm                          2D MOD LV SV:         71                               biplane is LV SV Index:   33                               59.1 %. LVOT Area:     3.46 cm LV IVRT:       123 msec        Diastology                                LV e' medial:    8.39 cm/s                                LV E/e' medial:  10.3 LV Volumes (MOD)               LV e' lateral:   12.40 cm/s LV vol d, MOD    94.7 ml       LV E/e' lateral: 7.0 A2C: LV vol d, MOD    78.5 ml A4C: LV vol s, MOD    38.6 ml A2C: LV vol s, MOD    31.1 ml A4C: LV SV MOD A2C:   56.1 ml LV SV MOD A4C:   78.5 ml LV SV MOD BP:    50.6 ml RIGHT VENTRICLE             IVC RV S prime:     12.10 cm/s  IVC diam: 1.40 cm TAPSE (M-mode): 1.9 cm LEFT ATRIUM             Index        RIGHT ATRIUM           Index LA diam:        3.70 cm 1.71 cm/m   RA Area:     18.60 cm LA Vol (A2C):   51.5 ml 23.82 ml/m  RA Volume:   53.50 ml  24.74 ml/m LA Vol (A4C):   29.9 ml 13.83 ml/m LA Biplane Vol: 42.6 ml 19.70 ml/m  AORTIC VALVE AV Area (Vmax): 2.35 cm AV Vmax:        162.00 cm/s AV Peak Grad:   10.5 mmHg LVOT Vmax:      110.00 cm/s LVOT Vmean:     66.100 cm/s LVOT VTI:       0.204 m  AORTA Ao Root diam: 3.40 cm Ao Asc diam:  3.20 cm MITRAL VALVE MV Area (PHT): 2.76 cm    SHUNTS MV Decel Time: 275 msec    Systemic VTI:  0.20 m MV E velocity: 86.30 cm/s  Systemic Diam: 2.10 cm MV A velocity: 57.50 cm/s MV E/A ratio:  1.50 Dalton McleanMD Electronically signed by Ezra Kanner Signature Date/Time: 03/29/2024/1:30:25 PM    Final    DG CHEST PORT 1 VIEW Result Date: 03/29/2024 CLINICAL DATA:  ARDS EXAM: PORTABLE CHEST 1 VIEW COMPARISON:  Yesterday FINDINGS: Stable cardiomediastinal silhouette. Endotracheal tube is unchanged. Right-sided  PICC line and right internal jugular catheter unchanged. Stable mild patchy and interstitial densities are noted concerning for edema or inflammation with stable bilateral pleural effusions, right greater than left. IMPRESSION: Stable support apparatus. Stable bilateral lung opacities as described above. Electronically Signed   By: Lynwood Landy Raddle M.D.   On: 03/29/2024 13:16   DG Chest Port 1 View Result Date: 03/28/2024 CLINICAL DATA:  Acute respiratory failure. EXAM: PORTABLE CHEST 1 VIEW COMPARISON:  03/27/2024 FINDINGS: Right IJ central venous catheter with tip over the SVC. Unchanged and in adequate position. Right-sided PICC line with tip over the SVC. Loop recorder over the left chest unchanged. Lungs are adequately inflated demonstrate interval improvement and bilateral patchy airspace process likely multifocal pneumonia. Stable small bilateral pleural effusions likely with associated basilar atelectasis. Cardiomediastinal silhouette and remainder of the exam is unchanged. IMPRESSION: 1. Interval improvement and bilateral patchy airspace process likely multifocal pneumonia. Stable small bilateral pleural effusions likely with associated basilar atelectasis. 2. Tubes and lines as described. Electronically Signed   By: Toribio Agreste M.D.   On: 03/28/2024 13:38   DG CHEST PORT 1 VIEW Result Date: 03/27/2024 CLINICAL DATA:  Central line placement. EXAM: PORTABLE CHEST 1 VIEW COMPARISON:  Earlier same day radiograph at 8:10 a.m. FINDINGS: Endotracheal tube tip is located approximately 3.8 cm above the carina. Right CVC catheter tip at the mid to lower SVC. Stable right upper extremity PICC line. Cardiomediastinal contours are unchanged. Left chest loop recorder device. Unchanged multifocal patchy bilateral airspace opacities. Small bilateral pleural effusions. No pneumothorax identified. Visualized osseous structures are unchanged. IMPRESSION: 1. Endotracheal tube tip is located approximately 3.8 cm above  the carina. 2. Right CVC catheter tip at the mid to lower SVC. 3. Unchanged multifocal bilateral airspace disease. Electronically Signed   By: Harrietta Sherry M.D.   On: 03/27/2024 11:27   DG CHEST PORT 1 VIEW Result Date: 03/27/2024 EXAM: 1 VIEW(S) XRAY OF THE CHEST 03/27/2024 08:13:59 AM COMPARISON: 03/25/2024 CLINICAL HISTORY: Shortness of breath FINDINGS: LINES, TUBES AND DEVICES: Stable right upper extremity PICC line. Left chest loop recorder device noted. LUNGS AND PLEURA: Increased patchy bilateral airspace opacities. Small bilateral pleural effusions, unchanged. No pneumothorax. HEART AND MEDIASTINUM: Aortic atherosclerosis. No acute abnormality of the cardiac and mediastinal silhouettes. BONES AND SOFT TISSUES: No acute osseous abnormality. IMPRESSION: 1. Increased patchy bilateral airspace opacities. 2. Small bilateral pleural effusions, unchanged. Electronically signed by: Evalene Coho MD 03/27/2024 08:26 AM EST RP Workstation: [id number]   US  RENAL Result Date: 03/26/2024 CLINICAL DATA:  Acute renal insufficiency EXAM: RENAL / URINARY TRACT ULTRASOUND COMPLETE COMPARISON:  03/24/2024 FINDINGS: Right Kidney: Renal measurements: 12.4 x 6.3 x 5.2 cm = volume: 212.3 mL. Echogenicity within normal limits. No mass or hydronephrosis visualized. Left Kidney: Renal measurements: 8.9 x 4.7 x 2.9 cm = volume: 63.9 mL. Marked left renal cortical atrophy with increased echotexture compatible with medical  renal disease. No hydronephrosis or renal mass. Bladder: Bladder is decompressed, limiting its evaluation. Other: There is small volume ascites within the abdomen and pelvis. Gallbladder sludge is incidentally noted, with nonspecific gallbladder wall thickening measuring up to 7 mm. IMPRESSION: 1. Chronic left renal cortical atrophy. 2. Unremarkable right kidney. 3. Ascites, unchanged since recent CT. 4. Biliary sludge, with nonspecific gallbladder wall thickening given the adjacent ascites.  Electronically Signed   By: Ozell Daring M.D.   On: 03/26/2024 18:43    Microbiology: No results found for this or any previous visit (from the past 240 hours).   Labs: Basic Metabolic Panel: Recent Labs  Lab 04/20/24 0607 04/21/24 0529 04/22/24 0436 04/23/24 0322 04/24/24 0415  NA 134* 133* 135 135 137  K 4.1 4.2 4.2 4.2 3.8  CL 95* 94* 97* 97* 99  CO2 31 28 29 30 27   GLUCOSE 91 98 89 93 91  BUN 49* 63* 73* 42* 51*  CREATININE 2.59* 3.29* 3.84* 2.58* 3.33*  CALCIUM  8.1* 8.4* 8.4* 8.4* 8.5*   Liver Function Tests: Recent Labs  Lab 04/20/24 0607 04/21/24 0529 04/22/24 0436 04/23/24 0322 04/24/24 0415  AST 173* 127* 103* 93* 70*  ALT 299* 249* 203* 166* 135*  ALKPHOS 1,835* 1,744* 1,695* 1,549* 1,307*  BILITOT 2.3* 1.5* 1.5* 1.2 1.2  PROT 5.0* 5.3* 5.4* 5.5* 5.4*  ALBUMIN  1.9* 2.2* 2.2* 2.1* 2.1*   No results for input(s): LIPASE, AMYLASE in the last 168 hours. No results for input(s): AMMONIA in the last 168 hours. CBC: Recent Labs  Lab 04/19/24 0500 04/20/24 0607 04/21/24 0529 04/22/24 0436 04/24/24 0415  WBC 15.3* 15.9* 15.2* 14.6* 13.1*  HGB 8.7* 7.5* 7.7* 7.8* 7.6*  HCT 26.8* 23.1* 23.7* 24.2* 24.1*  MCV 82.7 82.2 81.7 83.2 86.4  PLT 258 298 320 326 293   Cardiac Enzymes: Recent Labs  Lab 04/20/24 0607  CKTOTAL 35*   BNP: BNP (last 3 results) Recent Labs    06/27/23 1116 03/22/24 0913 03/25/24 0535  BNP 646.4* 2,404.1* 831.9*    ProBNP (last 3 results) No results for input(s): PROBNP in the last 8760 hours.  CBG: Recent Labs  Lab 04/23/24 1602 04/23/24 2019 04/24/24 0009 04/24/24 0401 04/24/24 0843  GLUCAP 139* 137* 88 88 90       Signed:  Sigurd Pac MD.  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

## 2024-04-24 NOTE — Progress Notes (Signed)
  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  He has completed rounds of antibiotics.  Mild interstitial fibrosis/tubular atrophy.  Given concern for infection we provided steroids but avoided other IS medications due to weighing risks/benefits on pred taper now with plans to wean quickly given the main concern is infection as a cause he was on minoxidil  which caries similar risk for AI kidney phenomena as hydralazine . Stopped this (last dose 12/22)  Oliguric but no significant evidence of GFR recovery, continue dialysis Continue HD on MWF schedule: HD next Wednesday HD in chair today before d/c  Petechial rash;             - 2/2 Iga. S/p steroids, improved   Acute on chronic CHF exacerbation;             - HD and diuretics as aboven daily torsemide    H/o DVT and PE:             - Anticoagulation per cardiology and primary service   Boorehave's syndrome             - s/p repair             - Has a PEG  - EGD 03/19/24 with ulcers  - PPI and pepcid    HTN  - Carvedilol , followed BPs with UF  Elevated LFTs  - HIDA negative 04/19/24  - ALP out of proportion to AST/ ALT--> ? Bone turnover and was WNL until 03/27/24.  Note Ca not high  Dispo: Likely SNF - pending placement as of 1/2, To go to [facility] at  time of discharge   Subjective:    Seen in room. For HD today.  MRCP without sig findings.     Objective:   BP 118/63   Pulse 72   Temp 98.3 F (36.8 C)   Resp 17   Ht 6' 1 (1.854 m)   Wt 72 kg   SpO2 100%   BMI 20.94 kg/m   Intake/Output Summary (Last 24 hours) at 04/24/2024 1521 Last  data filed at 04/24/2024 0443 Gross per 24 hour  Intake 0 ml  Output 300 ml  Net -300 ml    Weight change: 0.3 kg  Physical Exam: GEN: sitting in bed PULM: normal WOB, clear ant CV: Normal rate, regular rhythm ABD: + PEG, mild distention EXT: trace edema NEURO: sleeping Dialysis access: RIJ HD catheter- tunneled  Imaging: MR ABDOMEN MRCP W WO CONTRAST Result Date: 04/24/2024 CLINICAL DATA:  Abnormal LFTs. EXAM: MRI ABDOMEN WITHOUT AND WITH CONTRAST (INCLUDING MRCP) TECHNIQUE: Multiplanar multisequence MR imaging of the abdomen was performed both before and after the administration of intravenous contrast. Heavily T2-weighted images of the biliary and pancreatic ducts were obtained, and three-dimensional MRCP images were rendered by post processing. CONTRAST:  7mL GADAVIST  GADOBUTROL  1 MMOL/ML IV SOLN COMPARISON:  Abdominal ultrasound 04/16/2024. FINDINGS: Lower chest: Small bilateral pleural effusions. Hepatobiliary: No focal abnormality within the liver. Gallbladder is distended with tiny 1-2 mm layering stones (axial 12/4). No intra or extrahepatic biliary duct dilatation. Extrahepatic common bile duct measures 6 mm diameter, upper normal. No evidence for choledocholithiasis. Pancreas: No focal mass lesion.  No dilatation of the main duct. No intraparenchymal cyst. No peripancreatic edema. Spleen:  No splenomegaly. No suspicious focal mass lesion. Adrenals/Urinary Tract: No adrenal nodule or mass. Left kidney markedly atrophic. Right kidney unremarkable. Stomach/Bowel: Gastrostomy tube evident. Stomach is nondistended. Duodenum is normally positioned as is the ligament of Treitz. No small bowel or colonic dilatation within the visualized abdomen. Vascular/Lymphatic: No abdominal aortic aneurysm. Linear band of hypointense signal identified in the lumen of the distal abdominal aorta compatible with the densely calcified finding on CT of 04/07/2024 potentially calcified mural thrombus or chronic  focal dissection. No abdominal lymphadenopathy Other:  No substantial intraperitoneal free fluid. Musculoskeletal: No suspicious marrow enhancement. IMPRESSION: 1. Cholelithiasis with tiny 1-2 mm layering stones in the gallbladder lumen. No intra or extrahepatic biliary duct dilatation. No evidence for choledocholithiasis. 2. Small bilateral pleural effusions. 3. Markedly atrophic left kidney. Electronically Signed   By: Camellia Candle M.D.   On: 04/24/2024 05:24   MR 3D Recon At Scanner Result Date: 04/24/2024 CLINICAL DATA:  Abnormal LFTs. EXAM: MRI ABDOMEN WITHOUT AND WITH CONTRAST (INCLUDING MRCP) TECHNIQUE: Multiplanar multisequence MR imaging of the abdomen was performed both before and after the administration of intravenous contrast. Heavily T2-weighted images of the biliary and pancreatic ducts were obtained, and three-dimensional MRCP images were rendered by post processing. CONTRAST:  7mL GADAVIST  GADOBUTROL  1 MMOL/ML IV SOLN COMPARISON:  Abdominal ultrasound 04/16/2024. FINDINGS: Lower chest: Small bilateral pleural effusions. Hepatobiliary: No focal abnormality within the liver. Gallbladder is distended with tiny 1-2 mm layering stones (axial 12/4). No intra or extrahepatic biliary duct dilatation. Extrahepatic common bile duct measures 6 mm diameter, upper normal. No evidence for choledocholithiasis. Pancreas: No focal mass lesion. No dilatation of the main duct. No intraparenchymal cyst. No peripancreatic edema. Spleen:  No splenomegaly. No suspicious focal mass lesion. Adrenals/Urinary Tract: No adrenal nodule or mass. Left kidney markedly atrophic. Right kidney unremarkable. Stomach/Bowel: Gastrostomy tube evident. Stomach is nondistended. Duodenum is normally positioned as is the ligament of Treitz. No small bowel or colonic dilatation within the visualized abdomen. Vascular/Lymphatic: No abdominal aortic aneurysm. Linear band of hypointense signal identified in the lumen of the distal abdominal  aorta compatible with the densely calcified finding on CT of 04/07/2024 potentially calcified mural thrombus or chronic focal dissection. No abdominal lymphadenopathy Other:  No substantial intraperitoneal free fluid. Musculoskeletal: No suspicious marrow enhancement. IMPRESSION: 1. Cholelithiasis with tiny 1-2 mm layering stones in the gallbladder lumen. No intra or extrahepatic biliary duct dilatation. No evidence for choledocholithiasis. 2. Small bilateral pleural effusions. 3. Markedly atrophic left kidney. Electronically Signed   By: Camellia Candle M.D.   On: 04/24/2024 05:24       Labs: BMET Recent Labs  Lab 04/18/24 0423 04/19/24 0500 04/20/24 9392 04/21/24 0529 04/22/24 0436 04/23/24 0322 04/24/24 0415  NA 134* 134* 134* 133* 135 135 137  K 4.5 4.7 4.1 4.2 4.2 4.2 3.8  CL 95* 95* 95* 94* 97* 97* 99  CO2 30 29 31 28 29 30 27   GLUCOSE 148* 87 91 98 89 93 91  BUN 59* 77* 49* 63* 73* 42* 51*  CREATININE 2.54* 3.23* 2.59* 3.29* 3.84* 2.58* 3.33*  CALCIUM  7.9* 8.3* 8.1* 8.4* 8.4* 8.4* 8.5*   CBC Recent Labs  Lab 04/20/24 0607 04/21/24 0529 04/22/24 0436 04/24/24 0415  WBC 15.9* 15.2* 14.6* 13.1*  HGB 7.5* 7.7* 7.8* 7.6*  HCT 23.1* 23.7* 24.2* 24.1*  MCV 82.2 81.7 83.2 86.4  PLT 298 320 326 293  Medications:     apixaban   5 mg Oral BID   carvedilol   3.125 mg Per Tube BID WC   Chlorhexidine  Gluconate Cloth  6 each Topical Daily   clonazePAM   0.5 mg Per Tube BID   famotidine   20 mg Per Tube Daily   feeding supplement (NEPRO CARB STEADY)  237 mL Oral BID BM   [name] butt cream   Topical BID   guaiFENesin   10 mL Per Tube Q4H   insulin  aspart  0-9 Units Subcutaneous Q4H   lidocaine   1 patch Transdermal Q24H   multivitamin  1 tablet Oral QHS   pantoprazole   40 mg Oral BID   predniSONE   10 mg Oral Q breakfast   saccharomyces boulardii  250 mg Per Tube BID   sodium chloride  flush  10-40 mL Intracatheter Q12H   torsemide   100 mg Oral Daily

## 2024-04-25 DIAGNOSIS — K223 Perforation of esophagus: Secondary | ICD-10-CM | POA: Diagnosis not present

## 2024-04-25 LAB — GLUCOSE, CAPILLARY
Glucose-Capillary: 90 mg/dL (ref 70–99)
Glucose-Capillary: 86 mg/dL (ref 70–99)
Glucose-Capillary: 115 mg/dL — ABNORMAL HIGH (ref 70–99)
Glucose-Capillary: 130 mg/dL — ABNORMAL HIGH (ref 70–99)
Glucose-Capillary: 135 mg/dL — ABNORMAL HIGH (ref 70–99)

## 2024-04-25 LAB — COMPREHENSIVE METABOLIC PANEL WITH GFR
ALT: 128 U/L — ABNORMAL HIGH (ref 0–44)
AST: 78 U/L — ABNORMAL HIGH (ref 15–41)
Albumin: 2.2 g/dL — ABNORMAL LOW (ref 3.5–5.0)
Alkaline Phosphatase: 1380 U/L — ABNORMAL HIGH (ref 38–126)
Anion gap: 7 (ref 5–15)
BUN: 29 mg/dL — ABNORMAL HIGH (ref 8–23)
CO2: 30 mmol/L (ref 22–32)
Calcium: 8.3 mg/dL — ABNORMAL LOW (ref 8.9–10.3)
Chloride: 97 mmol/L — ABNORMAL LOW (ref 98–111)
Creatinine, Ser: 2.26 mg/dL — ABNORMAL HIGH (ref 0.61–1.24)
GFR, Estimated: 30 mL/min — ABNORMAL LOW
Glucose, Bld: 88 mg/dL (ref 70–99)
Potassium: 3.6 mmol/L (ref 3.5–5.1)
Sodium: 134 mmol/L — ABNORMAL LOW (ref 135–145)
Total Bilirubin: 1.1 mg/dL (ref 0.0–1.2)
Total Protein: 5.4 g/dL — ABNORMAL LOW (ref 6.5–8.1)

## 2024-04-25 LAB — ANTI-SMOOTH MUSCLE ANTIBODY, IGG: F-Actin IgG: 27 U — ABNORMAL HIGH (ref 0–19)

## 2024-04-25 NOTE — Plan of Care (Signed)
 " Problem: Education: Goal: Knowledge of General Education information will improve Description: Including pain rating scale, medication(s)/side effects and non-pharmacologic comfort measures Outcome: Progressing   Problem: Health Behavior/Discharge Planning: Goal: Ability to manage health-related needs will improve Outcome: Progressing   Problem: Clinical Measurements: Goal: Will remain free from infection Outcome: Progressing Goal: Diagnostic test results will improve Outcome: Progressing Goal: Respiratory complications will improve Outcome: Progressing Goal: Cardiovascular complication will be avoided Outcome: Progressing   Problem: Activity: Goal: Risk for activity intolerance will decrease Outcome: Progressing   Problem: Nutrition: Goal: Adequate nutrition will be maintained Outcome: Progressing   Problem: Coping: Goal: Level of anxiety will decrease Outcome: Progressing   Problem: Elimination: Goal: Will not experience complications related to bowel motility Outcome: Progressing Goal: Will not experience complications related to urinary retention Outcome: Progressing   Problem: Pain Managment: Goal: General experience of comfort will improve and/or be controlled Outcome: Progressing   Problem: Safety: Goal: Ability to remain free from injury will improve Outcome: Progressing   Problem: Skin Integrity: Goal: Risk for impaired skin integrity will decrease Outcome: Progressing   Problem: Education: Goal: Ability to describe self-care measures that may prevent or decrease complications (Diabetes Survival Skills Education) will improve Outcome: Progressing   Problem: Coping: Goal: Ability to adjust to condition or change in health will improve Outcome: Progressing   Problem: Fluid Volume: Goal: Ability to maintain a balanced intake and output will improve Outcome: Progressing   Problem: Health Behavior/Discharge Planning: Goal: Ability to identify and  utilize available resources and services will improve Outcome: Progressing Goal: Ability to manage health-related needs will improve Outcome: Progressing   Problem: Metabolic: Goal: Ability to maintain appropriate glucose levels will improve Outcome: Progressing   Problem: Nutritional: Goal: Maintenance of adequate nutrition will improve Outcome: Progressing Goal: Progress toward achieving an optimal weight will improve Outcome: Progressing   Problem: Skin Integrity: Goal: Risk for impaired skin integrity will decrease Outcome: Progressing   Problem: Tissue Perfusion: Goal: Adequacy of tissue perfusion will improve Outcome: Progressing   Problem: Activity: Goal: Ability to tolerate increased activity will improve Outcome: Progressing   Problem: Respiratory: Goal: Ability to maintain a clear airway and adequate ventilation will improve Outcome: Progressing   Problem: Role Relationship: Goal: Method of communication will improve Outcome: Progressing   Problem: Education: Goal: Ability to describe self-care measures that may prevent or decrease complications (Diabetes Survival Skills Education) will improve Outcome: Progressing Goal: Individualized Educational Video(s) Outcome: Progressing   Problem: Coping: Goal: Ability to adjust to condition or change in health will improve Outcome: Progressing   Problem: Fluid Volume: Goal: Ability to maintain a balanced intake and output will improve Outcome: Progressing   Problem: Health Behavior/Discharge Planning: Goal: Ability to identify and utilize available resources and services will improve Outcome: Progressing Goal: Ability to manage health-related needs will improve Outcome: Progressing   Problem: Metabolic: Goal: Ability to maintain appropriate glucose levels will improve Outcome: Progressing   Problem: Nutritional: Goal: Maintenance of adequate nutrition will improve Outcome: Progressing Goal: Progress  toward achieving an optimal weight will improve Outcome: Progressing   Problem: Skin Integrity: Goal: Risk for impaired skin integrity will decrease Outcome: Progressing   Problem: Tissue Perfusion: Goal: Adequacy of tissue perfusion will improve Outcome: Progressing   Problem: Education: Goal: Knowledge of disease and its progression will improve Outcome: Progressing   Problem: Health Behavior/Discharge Planning: Goal: Ability to manage health-related needs will improve Outcome: Progressing   Problem: Clinical Measurements: Goal: Complications related to the disease process or treatment  will be avoided or minimized Outcome: Progressing Goal: Dialysis access will remain free of complications Outcome: Progressing   Problem: Activity: Goal: Activity intolerance will improve Outcome: Progressing   "

## 2024-04-25 NOTE — Progress Notes (Signed)
 No d/c orders noted at this time. Pt is to go to snf at d/c and start at clinic tomorrow [facility]. Called [facility] to inform that pt may not be arriving tomorrow as planned for first day. Following along for updates. Clinic said they would check in again this afternoon. Will continue to assist  Emira Eubanks Dialysis Navigator [phone]

## 2024-04-25 NOTE — Progress Notes (Signed)
 Occupational Therapy Treatment Patient Details Name: Billy [name] MRN: [id number] DOB: July 05, 1950 Today's Date: 04/25/2024   History of present illness Billy [name] is a 74 y.o. male who presented to [facility] ED 01/21/24 with several days of vomiting followed by an episode of severe tearing epigastric pain. Work-up revealed esophageal tear. Pt s/p esophageal stent with EGD 10/7. Attempted stent repositioning on 10/15 and was hypoxic, was intubated.  Reintubated 10/20-10/24. G tube placed 11/3. He accidentally pulled out the R chest tube on 11/7.L chest tube removed 11/11. Found to have superficial venous thrombosis of RUE 11/19. N&V with epigastric pain 11/28, found severe duodenitis on repeat CT. S/P EGD on 12/2. 12/10 re-intubated due to respiratory distress. CRRT 12/10-12/16. Extubated 12/12. 12/26 severe respiratory distress needing nonrebreather, volume overloaded, required emergent HD. Pending US  abdomen. PMHx: HFrEF, AFib, CAD, PE complicated by cardiogenic shock, CKD 4, MI, peripheral neuropathy, and TIA.   OT comments  Hoped to progress functional gait today though pt limited to transfers only d/t anxiety and reported fatigue. Pt required Mod A x 2 to pivot to dialysis chair using RW with max cues for safety due to progressive anxiety and fear of falling in standing. Continue to recommend a postacute rehab stay upon DC to maximize ADL/mobility independence.       If plan is discharge home, recommend the following:  Two people to help with walking and/or transfers;Two people to help with bathing/dressing/bathroom;Assistance with cooking/housework;Assistance with feeding;Assist for transportation;Supervision due to cognitive status   Equipment Recommendations  Other (comment) (TBD)    Recommendations for Other Services      Precautions / Restrictions Precautions Precautions: Fall Recall of Precautions/Restrictions: Intact Precaution/Restrictions Comments: PEG Restrictions Weight Bearing  Restrictions Per Provider Order: No       Mobility Bed Mobility Overal bed mobility: Needs Assistance Bed Mobility: Supine to Sit     Supine to sit: Min assist, HOB elevated, Used rails     General bed mobility comments: Min A for trunk guidance to EOB    Transfers Overall transfer level: Needs assistance Equipment used: Rolling walker (2 wheels) Transfers: Sit to/from Stand, Bed to chair/wheelchair/BSC Sit to Stand: Mod assist, +2 safety/equipment, +2 physical assistance, From elevated surface     Step pivot transfers: Mod assist, +2 physical assistance, +2 safety/equipment     General transfer comment: Mod A x 2 to stand from slightly elevated bed using RW. Cued to attempt taking steps towards door w/ plan to pull chair up behind pt. However, once in standing, pt with more anxiety, forward flexed posture and only able to pivot to chair. Max cues for safety as pt pushing RW too far in front     Balance Overall balance assessment: Needs assistance Sitting-balance support: Feet supported, No upper extremity supported Sitting balance-Leahy Scale: Fair     Standing balance support: Bilateral upper extremity supported, During functional activity, Reliant on assistive device for balance Standing balance-Leahy Scale: Poor                             ADL either performed or assessed with clinical judgement   ADL Overall ADL's : Needs assistance/impaired Eating/Feeding: Independent;Sitting   Grooming: Set up;Sitting           Upper Body Dressing : Minimal assistance;Sitting                          Extremity/Trunk Assessment Upper Extremity Assessment  Upper Extremity Assessment: Generalized weakness;Right hand dominant   Lower Extremity Assessment Lower Extremity Assessment: Defer to PT evaluation        Vision   Vision Assessment?: No apparent visual deficits   Perception     Praxis     Communication Communication Communication:  Impaired Factors Affecting Communication: Hearing impaired   Cognition Arousal: Alert Behavior During Therapy: Anxious, Flat affect, Agitated Cognition: Cognition impaired     Awareness: Online awareness impaired Memory impairment (select all impairments): Working memory Attention impairment (select first level of impairment): Sustained attention Executive functioning impairment (select all impairments): Problem solving, Reasoning, Initiation, Sequencing, Organization OT - Cognition Comments: particular about session tasks and room setup. anxious with activity with a fear of falling, agitated intermittently often saying dont rush me when cued to do things such as slow your breathing. very poor safety awareness with transfers requiring max cues                 Following commands: Impaired Following commands impaired: Follows one step commands with increased time, Only follows one step commands consistently      Cueing   Cueing Techniques: Verbal cues, Tactile cues, Gestural cues  Exercises      Shoulder Instructions       General Comments      Pertinent Vitals/ Pain       Pain Assessment Pain Assessment: Faces Faces Pain Scale: Hurts a little bit Pain Location: generalized Pain Descriptors / Indicators: Discomfort, Aching, Sore Pain Intervention(s): Monitored during session, Premedicated before session  Home Living                                          Prior Functioning/Environment              Frequency  Min 1X/week        Progress Toward Goals  OT Goals(current goals can now be found in the care plan section)  Progress towards OT goals: Progressing toward goals     Plan      Co-evaluation                 AM-PAC OT 6 Clicks Daily Activity     Outcome Measure   Help from another person eating meals?: None Help from another person taking care of personal grooming?: A Little Help from another person toileting,  which includes using toliet, bedpan, or urinal?: Total Help from another person bathing (including washing, rinsing, drying)?: A Lot Help from another person to put on and taking off regular upper body clothing?: A Little Help from another person to put on and taking off regular lower body clothing?: A Lot 6 Click Score: 15    End of Session Equipment Utilized During Treatment: Gait belt;Rolling walker (2 wheels);Oxygen   OT Visit Diagnosis: Muscle weakness (generalized) (M62.81);Other symptoms and signs involving cognitive function;Unsteadiness on feet (R26.81)   Activity Tolerance Patient limited by fatigue   Patient Left in chair;with call bell/phone within reach;with chair alarm set   Nurse Communication Mobility status        Time: 8876-8845 OT Time Calculation (min): 31 min  Charges: OT General Charges $OT Visit: 1 Visit OT Treatments $Self Care/Home Management : 8-22 mins $Therapeutic Activity: 8-22 mins  Billy [name], OTR/L Acute Rehab Services Office: [phone]   Billy Fish 04/25/2024, 12:16 PM

## 2024-04-25 NOTE — Progress Notes (Signed)
 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  He has completed rounds of antibiotics.  Mild interstitial fibrosis/tubular atrophy.  Given concern for infection we provided steroids but avoided other IS medications due to weighing risks/benefits on pred taper now with plans to wean quickly given the main concern is infection as a cause he was on minoxidil  which caries similar risk for AI kidney phenomena as hydralazine . Stopped this (last dose 12/22)  Continue HD on MWF schedule: HD next Wednesday Successful with HD in chair Wednesday Making a little bit more urine- encouraging sign  Petechial rash;             - 2/2 Iga. S/p steroids, improved   Acute on chronic CHF exacerbation;             - HD and diuretics as aboven daily torsemide    H/o DVT and PE:             - Anticoagulation per cardiology and primary service   Boorehave's syndrome             - s/p repair             - Has a PEG  - EGD 03/19/24 with ulcers  - PPI and pepcid    HTN  - Carvedilol , followed BPs with UF  Elevated LFTs  - HIDA negative 04/19/24  - ALP out of proportion to AST/ ALT--> ? Bone turnover and was WNL until 03/27/24.  Note Ca not high  Dispo: Likely SNF - pending placement as of 1/2, To go to [facility] at  time of discharge   Subjective:    Seen in room. Upset today.     Objective:   BP 113/67   Pulse 62   Temp 98.2 F (36.8 C)   Resp 18   Ht 6' 1 (1.854 m)   Wt 69.2 kg   SpO2 94%   BMI 20.13 kg/m   Intake/Output Summary (Last 24 hours) at 04/25/2024 1417 Last data filed at 04/25/2024 9487 Gross per 24  hour  Intake --  Output 2600 ml  Net -2600 ml    Weight change: -2.8 kg  Physical Exam: GEN: sitting in bed PULM: normal WOB, clear ant CV: Normal rate, regular rhythm ABD: + PEG, mild distention EXT: trace edema NEURO: sleeping Dialysis access: RIJ HD catheter- tunneled  Imaging: MR ABDOMEN MRCP W WO CONTRAST Result Date: 04/24/2024 CLINICAL DATA:  Abnormal LFTs. EXAM: MRI ABDOMEN WITHOUT AND WITH CONTRAST (INCLUDING MRCP) TECHNIQUE: Multiplanar multisequence MR imaging of the abdomen was performed both before and after the administration of intravenous contrast. Heavily T2-weighted images of the biliary and pancreatic ducts were obtained, and three-dimensional MRCP images were rendered by post processing. CONTRAST:  7mL GADAVIST  GADOBUTROL  1 MMOL/ML IV SOLN COMPARISON:  Abdominal ultrasound 04/16/2024. FINDINGS: Lower chest: Small bilateral pleural effusions. Hepatobiliary: No focal abnormality within the liver. Gallbladder is distended with tiny 1-2 mm layering stones (axial 12/4). No intra or extrahepatic biliary duct dilatation. Extrahepatic common bile duct measures 6 mm diameter, upper normal. No evidence for choledocholithiasis. Pancreas: No focal mass lesion. No dilatation of the main duct. No intraparenchymal cyst.  No peripancreatic edema. Spleen:  No splenomegaly. No suspicious focal mass lesion. Adrenals/Urinary Tract: No adrenal nodule or mass. Left kidney markedly atrophic. Right kidney unremarkable. Stomach/Bowel: Gastrostomy tube evident. Stomach is nondistended. Duodenum is normally positioned as is the ligament of Treitz. No small bowel or colonic dilatation within the visualized abdomen. Vascular/Lymphatic: No abdominal aortic aneurysm. Linear band of hypointense signal identified in the lumen of the distal abdominal aorta compatible with the densely calcified finding on CT of 04/07/2024 potentially calcified mural thrombus or chronic focal dissection. No abdominal  lymphadenopathy Other:  No substantial intraperitoneal free fluid. Musculoskeletal: No suspicious marrow enhancement. IMPRESSION: 1. Cholelithiasis with tiny 1-2 mm layering stones in the gallbladder lumen. No intra or extrahepatic biliary duct dilatation. No evidence for choledocholithiasis. 2. Small bilateral pleural effusions. 3. Markedly atrophic left kidney. Electronically Signed   By: Camellia Candle M.D.   On: 04/24/2024 05:24   MR 3D Recon At Scanner Result Date: 04/24/2024 CLINICAL DATA:  Abnormal LFTs. EXAM: MRI ABDOMEN WITHOUT AND WITH CONTRAST (INCLUDING MRCP) TECHNIQUE: Multiplanar multisequence MR imaging of the abdomen was performed both before and after the administration of intravenous contrast. Heavily T2-weighted images of the biliary and pancreatic ducts were obtained, and three-dimensional MRCP images were rendered by post processing. CONTRAST:  7mL GADAVIST  GADOBUTROL  1 MMOL/ML IV SOLN COMPARISON:  Abdominal ultrasound 04/16/2024. FINDINGS: Lower chest: Small bilateral pleural effusions. Hepatobiliary: No focal abnormality within the liver. Gallbladder is distended with tiny 1-2 mm layering stones (axial 12/4). No intra or extrahepatic biliary duct dilatation. Extrahepatic common bile duct measures 6 mm diameter, upper normal. No evidence for choledocholithiasis. Pancreas: No focal mass lesion. No dilatation of the main duct. No intraparenchymal cyst. No peripancreatic edema. Spleen:  No splenomegaly. No suspicious focal mass lesion. Adrenals/Urinary Tract: No adrenal nodule or mass. Left kidney markedly atrophic. Right kidney unremarkable. Stomach/Bowel: Gastrostomy tube evident. Stomach is nondistended. Duodenum is normally positioned as is the ligament of Treitz. No small bowel or colonic dilatation within the visualized abdomen. Vascular/Lymphatic: No abdominal aortic aneurysm. Linear band of hypointense signal identified in the lumen of the distal abdominal aorta compatible with the  densely calcified finding on CT of 04/07/2024 potentially calcified mural thrombus or chronic focal dissection. No abdominal lymphadenopathy Other:  No substantial intraperitoneal free fluid. Musculoskeletal: No suspicious marrow enhancement. IMPRESSION: 1. Cholelithiasis with tiny 1-2 mm layering stones in the gallbladder lumen. No intra or extrahepatic biliary duct dilatation. No evidence for choledocholithiasis. 2. Small bilateral pleural effusions. 3. Markedly atrophic left kidney. Electronically Signed   By: Camellia Candle M.D.   On: 04/24/2024 05:24       Labs: BMET Recent Labs  Lab 04/19/24 0500 04/20/24 0607 04/21/24 0529 04/22/24 0436 04/23/24 0322 04/24/24 0415 04/25/24 0122  NA 134* 134* 133* 135 135 137 134*  K 4.7 4.1 4.2 4.2 4.2 3.8 3.6  CL 95* 95* 94* 97* 97* 99 97*  CO2 29 31 28 29 30 27 30   GLUCOSE 87 91 98 89 93 91 88  BUN 77* 49* 63* 73* 42* 51* 29*  CREATININE 3.23* 2.59* 3.29* 3.84* 2.58* 3.33* 2.26*  CALCIUM  8.3* 8.1* 8.4* 8.4* 8.4* 8.5* 8.3*   CBC Recent Labs  Lab 04/20/24 0607 04/21/24 0529 04/22/24 0436 04/24/24 0415  WBC 15.9* 15.2* 14.6* 13.1*  HGB 7.5* 7.7* 7.8* 7.6*  HCT 23.1* 23.7* 24.2* 24.1*  MCV 82.2 81.7 83.2 86.4  PLT 298 320 326 293    Medications:     apixaban   5 mg  Oral BID   carvedilol   3.125 mg Per Tube BID WC   Chlorhexidine  Gluconate Cloth  6 each Topical Daily   clonazePAM   0.5 mg Per Tube BID   famotidine   20 mg Per Tube Daily   feeding supplement (NEPRO CARB STEADY)  237 mL Oral BID BM   [name] butt cream   Topical BID   guaiFENesin   10 mL Per Tube Q4H   insulin  aspart  0-9 Units Subcutaneous Q4H   lidocaine   1 patch Transdermal Q24H   multivitamin  1 tablet Oral QHS   pantoprazole   40 mg Oral BID   predniSONE   10 mg Oral Q breakfast   saccharomyces boulardii  250 mg Per Tube BID   sodium chloride  flush  10-40 mL Intracatheter Q12H   torsemide   100 mg Oral Daily

## 2024-04-25 NOTE — Progress Notes (Signed)
 " Progress Note   Patient: Billy [name] FMW:[id number] DOB: [birth year] DOA: 01/21/2024     95 DOS: the patient was seen and examined on 04/25/2024   Brief hospital course:  Chronically ill 73/M prolonged hospitalization from 3/10-4/3 with cardiac arrest, A-fib, DVTs, hematochezia, IVC filter, chest tubes, eventually stabilized, discharged to rehab and then went home, readmitted 10/5 with large esophageal tear 10/7 esophageal stent placed by cardiothoracic 10/15 stent reposition 10/16 extubated 10/20 reintubated with ultrasound showing left-sided pleural effusion-left-sided chest tube placed-by lights criteria exudative effusion 10/23 echo 60-65% 10/24 barium swallow showed persistent leak 10/28 transferred out of ICU 11/3 PEG tube placed 11/19 swelling of right arm showing on ultrasound SVT cephalic vein 11/22 chest x-ray?  Pneumonia done because of back pain-follow-up CT scan shows stent in place no mediastinal fluid collection small bilateral pleural effusions with bibasilar atelectasis pneumonia not excluded-scattered clusters nodular densities predominantly right upper and right lower lobes with nodule 11 mm as previously seen?  Mets?  Infection?  Aspiration 11/26 stent removed. Esophagram negative for esophageal leak.  11/27 getting diuresis again 11/28 duodenitis/enteritis.  GI consulted. Bloody emesis added carafate   11/29 trialing clears 12/1 NV again w/ hematemesis. Lasix  held ---  esophagram performed showing patent esophagus without perforation irregularity mid esophagus?  Stent diffuse narrowing lumen distal duodenum and proximal jejunum 12/2 EGD ischemic ulcers. Impression:  - Congested, inflamed, nodular, texture changed  mucosa in the esophagus. - Gastritis, Non-bleeding duodenal ulcers with a clean ulcer  base (Forrest Class III).- Non-bleeding jejunal ulcers with a clean ulcer   base (Forrest Class III)  - Findings suggestive of ischemic ulcers. Increased carafate  and cont BID  PPI  12/3 full liq diet  12/4 marked increased WOB. Acute and progressive w/ increased hypoxia and HTN. 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 12/17 off clev transition to iHD, transfer to floor  12/18: TDC placed 12/19 [name] assumed care, has distended and painful abdomen, KUB shows improvement so tube feeds resumed, Eliquis  resumed 12/22 hemoccult neg---transfused 1 U--- Eliquis  had to be held in the setting 12/23 vascular consulted secondary to lower extremities----x-ray shows congestion no pneumonia white count up-- 12/24 ABIs showed abnormal right and left toe brachial indices-no intervention from vascular required 12/26 severe respiratory distress needing nonrebreather 12/29, EP consulted for recurrent ventricular tachycardia, recurrent A-fib RVR> Amio gtt. 12/30, started on mexiletine 12/31, LFTs trending up, amiodarone  and mexiletine discontinued,  1/1: LFTs continue to worsen, HIDA scan ordered, palliative care consulted 1/2: HIDA scan negative for cholecystitis, holding tube feeds which could be contributing to cholestasis Restarting IV heparin  for persistent A-fib       1/3: AST ALT improving, alk phos still significantly elevated 1/5: switched IV Heparin  to Eliquis       Subjective: Patient seen and examined at bedside this morning He denies nausea vomiting abdominal pain chest pain cough Still continues to await placement Currently on 3 L of intranasal oxygen    Assessment and Plan:   Acute kidney injury, new ESRD Volume overload -sp CRRT 12/10-12/16, TDC placed  12/18 - ANA positive, C4 low, antihistone antibody positive, noted to have hematuria as well  -  Renal biopsy 12/16 noted 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, felt to be secondary to infection  - Completed course of antibiotics  - Currently on steroids per nephrology  - Consulted palliative care with all above ongoing issues and high risk of decompensation, wishes for full code and full scope for now   Nonspecific leukocytosis -Afebrile, has completed multiple rounds of antibiotics - Ongoing prednisone  could be contributing,  - with abnormal LFTs and mild abdominal tenderness chronic cholecystitis was felt to be a possibility, RUQ US  q/ GB wall thickening, CBD was normal, HIDA scan finally completed  negative for acute cholecystitis   Boerhaave syndrome Recurrent upper GI bleed - 10/7 had esophageal stent placed by CT surgery - 10/15, esophageal stent repositioned - 11/26: Esophageal stent removed, no further leak -Ischemic ulcer disease--GI and cardiothoracic aware-several CTs since 11/25---CT scan done 12/21 unremarkable, no Carafate  with HD, Maalox discontinued -Last esophagram as above 12/1 -Continue Protonix , changed to p.o. - No further bleeding now, has been on IV heparin , switch to oral Eliquis  1/5   Elevated LFT's -On CT abd from 12/21 with mild biliary ductal dilation vs mild periportal edema and tiny layering stone in GB neck - Right upper quadrant ultrasound was nonspecific, ascites and gallbladder wall thickening, CBD was normal, has mild epigastric and right sided tenderness on exam, HIDA scan negative for cholecystitis 1/2 - Held tube feeds in case that could be contributing to cholestasis - Amiodarone  discontinued 12/31, 1/1 mexiletine was discontinued due to predominant hepatic metabolism - off lipitor  and Tylenol  - Suspect could have had a shock liver type injury BP dropped to 90s 12/28-29, versus passive  hepatic congestion from volume overloaded state, LFTs slowly improving, alk phos still significantly elevated however no obstructive pattern on imaging - Slowly improving now Continue to monitor   Severe Malnutrition-BMI 22 evidence of weight loss -S/p PEG -Continue dysphagia 3 diet , holding TFs temporarily, oral intake improving   Normocytic anemia secondary to peptic ulcer disease,?  Ongoing gastritis as above -Transfused PRBC x 1u in October, x1 unit 03/29/2024,12/23, 12/28   Cardiac arrest 06/2023 with HF MR EF 55-65% 03/27/2024   A-fib RVR CHADVASC >4 on Eliquis  [cardioverted 06/2023]--- recurrent RVR at times -Eliquis  has been held since 12/22  -cardiology following again for recurrent A-fib RVR -Now on low-dose Coreg , amiodarone  and mexiletine discontinued 12/31 with abnormal LFTs - We started IV heparin  1/2, switched to Eliquis     DVT PE March 2025 -does have IVC filter in place since March Lower limb ischemia ruled out 12.23 ABI rules out ischemia-appreciate VVS--no intervention needed Continue Eliquis    Insomnia - Continued on regimen of trazodone  Seroquel  and hydroxyzine    Diabetes - CBG stable   Multiple bedsores and wounds anterior right and left foot and injury stage I to bottom -Continue Gerhardt twice daily Lidoderm  patch for back pain   VT - recurrent runs of VT 12/28 - Cards was following, amiodarone  and mexiletine discontinued with rising LFTs, continue low-dose Coreg , recommended Toprol  if he has recurrence    DVT prophylaxis: eliquis   Code Status: FUll Code  Family Communication: none present  Disposition Plan: SNF if he improves     Examination:   General exam: Chronically ill-appearing, AAO x 2, no distress, irritable this morning HEENT: Positive JVD, right IJ HD catheter CVS: S1-S2, irregular rhythm Lungs: Clear anteriorly, decreased breath sounds at the bases Abdomen: Mildly distended, soft, nontender, PEG tube noted Extremities: 1+  edema Psychiatry: Flat affect  Data Reviewed:      Latest Ref Rng & Units 04/24/2024    4:15 AM 04/22/2024    4:36 AM 04/21/2024    5:29 AM  CBC  WBC 4.0 - 10.5 K/uL 13.1  14.6  15.2   Hemoglobin 13.0 - 17.0 g/dL 7.6  7.8  7.7   Hematocrit 39.0 - 52.0 % 24.1  24.2  23.7   Platelets 150 - 400 K/uL 293  326  320        Latest Ref Rng & Units 04/25/2024    1:22 AM 04/24/2024    4:15 AM 04/23/2024    3:22 AM  BMP  Glucose 70 - 99 mg/dL 88  91  93   BUN 8 - 23 mg/dL 29  51  42   Creatinine 0.61 - 1.24 mg/dL 7.73  6.66  7.41   Sodium 135 - 145 mmol/L 134  137  135   Potassium 3.5 - 5.1 mmol/L 3.6  3.8  4.2   Chloride 98 - 111 mmol/L 97  99  97   CO2 22 - 32 mmol/L 30  27  30    Calcium  8.9 - 10.3 mg/dL 8.3  8.5  8.4     Vitals:   04/25/24 0457 04/25/24 0901 04/25/24 1019 04/25/24 1626  BP: 131/68 113/67 127/67 127/67  Pulse: 87 62 78 78  Resp: 17 18 18 18   Temp: 97.7 F (36.5 C) 98.2 F (36.8 C) 98.1 F (36.7 C) 98.1 F (36.7 C)  TempSrc: Oral  Oral Oral  SpO2: 100% 94%  100%  Weight:      Height:          Author: Drue ONEIDA Potter, MD 04/25/2024 4:48 PM  For on call review [email or web address].  "

## 2024-04-26 DIAGNOSIS — K223 Perforation of esophagus: Secondary | ICD-10-CM | POA: Diagnosis not present

## 2024-04-26 LAB — BASIC METABOLIC PANEL WITH GFR
Anion gap: 11 (ref 5–15)
BUN: 43 mg/dL — ABNORMAL HIGH (ref 8–23)
CO2: 28 mmol/L (ref 22–32)
Calcium: 8.2 mg/dL — ABNORMAL LOW (ref 8.9–10.3)
Chloride: 97 mmol/L — ABNORMAL LOW (ref 98–111)
Creatinine, Ser: 3.08 mg/dL — ABNORMAL HIGH (ref 0.61–1.24)
GFR, Estimated: 21 mL/min — ABNORMAL LOW
Glucose, Bld: 94 mg/dL (ref 70–99)
Potassium: 3.8 mmol/L (ref 3.5–5.1)
Sodium: 136 mmol/L (ref 135–145)

## 2024-04-26 LAB — GLUCOSE, CAPILLARY
Glucose-Capillary: 103 mg/dL — ABNORMAL HIGH (ref 70–99)
Glucose-Capillary: 116 mg/dL — ABNORMAL HIGH (ref 70–99)
Glucose-Capillary: 83 mg/dL (ref 70–99)
Glucose-Capillary: 89 mg/dL (ref 70–99)
Glucose-Capillary: 97 mg/dL (ref 70–99)

## 2024-04-26 LAB — CBC
HCT: 24.9 % — ABNORMAL LOW (ref 39.0–52.0)
Hemoglobin: 7.7 g/dL — ABNORMAL LOW (ref 13.0–17.0)
MCH: 27.6 pg (ref 26.0–34.0)
MCHC: 30.9 g/dL (ref 30.0–36.0)
MCV: 89.2 fL (ref 80.0–100.0)
Platelets: 234 K/uL (ref 150–400)
RBC: 2.79 MIL/uL — ABNORMAL LOW (ref 4.22–5.81)
RDW: 20.8 % — ABNORMAL HIGH (ref 11.5–15.5)
WBC: 12.3 K/uL — ABNORMAL HIGH (ref 4.0–10.5)
nRBC: 0 % (ref 0.0–0.2)

## 2024-04-26 MED ORDER — ENSURE PLUS HIGH PROTEIN PO LIQD
237.0000 mL | Freq: Two times a day (BID) | ORAL | Status: DC
  Administered 2024-04-26 – 2024-04-27 (×2): 237 mL via ORAL

## 2024-04-26 MED ORDER — PREDNISONE 10 MG PO TABS
10.0000 mg | ORAL_TABLET | Freq: Every day | ORAL | Status: AC
  Administered 2024-04-27: 10 mg
  Filled 2024-04-26: qty 1

## 2024-04-26 MED ORDER — PANTOPRAZOLE SODIUM 40 MG IV SOLR
40.0000 mg | Freq: Once | INTRAVENOUS | Status: AC
  Administered 2024-04-26: 40 mg via INTRAVENOUS
  Filled 2024-04-26: qty 10

## 2024-04-26 MED ORDER — APIXABAN 5 MG PO TABS
5.0000 mg | ORAL_TABLET | Freq: Two times a day (BID) | ORAL | Status: DC
  Administered 2024-04-27 (×2): 5 mg
  Filled 2024-04-26 (×2): qty 1

## 2024-04-26 MED ORDER — HEPARIN SODIUM (PORCINE) 1000 UNIT/ML IJ SOLN
3800.0000 [IU] | Freq: Once | INTRAMUSCULAR | Status: AC
  Administered 2024-04-26: 3800 [IU]

## 2024-04-26 MED ORDER — HEPARIN SODIUM (PORCINE) 1000 UNIT/ML IJ SOLN
INTRAMUSCULAR | Status: AC
  Filled 2024-04-26: qty 4

## 2024-04-26 MED ORDER — HYDROMORPHONE HCL 1 MG/ML IJ SOLN
INTRAMUSCULAR | Status: AC
  Filled 2024-04-26: qty 0.5

## 2024-04-26 MED ORDER — PANTOPRAZOLE SODIUM 40 MG PO TBEC
40.0000 mg | DELAYED_RELEASE_TABLET | Freq: Two times a day (BID) | ORAL | Status: DC
  Administered 2024-04-27 – 2024-04-29 (×6): 40 mg via ORAL
  Filled 2024-04-26 (×6): qty 1

## 2024-04-26 MED ORDER — TORSEMIDE 20 MG PO TABS
100.0000 mg | ORAL_TABLET | Freq: Every day | ORAL | Status: DC
  Administered 2024-04-27: 100 mg
  Filled 2024-04-26: qty 5

## 2024-04-26 MED ORDER — RENA-VITE PO TABS
1.0000 | ORAL_TABLET | Freq: Every day | ORAL | Status: DC
  Administered 2024-04-26: 1

## 2024-04-26 NOTE — Procedures (Signed)
 Patient seen and examined on Hemodialysis. The procedure was supervised and I have made appropriate changes. BP (!) 102/59   Pulse 65   Temp 97.9 F (36.6 C)   Resp (!) 22   Ht 6' 1 (1.854 m)   Wt 69.2 kg   SpO2 100%   BMI 20.13 kg/m   QB 400 mL/ min, UF goal 2L.  In the chair again.  This is 2nd rx in the chair Tolerating treatment without complaints at this time.   Almarie Bonine MD [facility] [phone] 10:25 AM

## 2024-04-26 NOTE — Progress Notes (Signed)
 Noted that snf declined pt, contacted [facility] to inform of this. Working closely with hotel manager. Hopefully can find SNF that willa accommodate HD here still, informed clinic I will update them next week with findings. They will hold spot for him at this time.   Lavanda Adrinne Sze Dialysis Navigator [phone]

## 2024-04-26 NOTE — Progress Notes (Signed)
" °   04/26/24 1311  Vitals  Temp 97.6 F (36.4 C)  Pulse Rate 100  Resp 20  BP (!) 107/56  SpO2 100 %  O2 Device Nasal Cannula  Weight (S)   (No Weight, D/T pt in a chair)  Type of Weight Post-Dialysis  Oxygen  Therapy  O2 Flow Rate (L/min) 2 L/min  Patient Activity (if Appropriate) In chair  Pulse Oximetry Type Continuous  Post Treatment  Dialyzer Clearance Clear  Hemodialysis Intake (mL) 0 mL  Liters Processed 84  Fluid Removed (mL) 2000 mL  Tolerated HD Treatment Yes  Post-Hemodialysis Comments Tx. completedwithout difficulties. UF goal obtained and Admin medication per order. Report call to [phone] Bedside Nurse   Received patient in bed to unit.  Alert and oriented.  Informed consent signed and in chart.   TX duration: 3.5  Patient tolerated well.  Transported back to the room  Alert, without acute distress.  Hand-off given to patient's nurse.   Access used: Yes Access issues: No  Total UF removed: 2000 Medication(s) given: See MAR Post HD VS: See Above Grid Post HD weight: Unable to get a weight   Zebedee DELENA [facility] "

## 2024-04-26 NOTE — Progress Notes (Signed)
 " Progress Note   Patient: Billy [name] FMW:[id number] DOB: 05/22/1950 DOA: 01/21/2024     96 DOS: the patient was seen and examined on 04/26/2024    Brief hospital course:   Chronically ill 73/M prolonged hospitalization from 3/10-4/3 with cardiac arrest, A-fib, DVTs, hematochezia, IVC filter, chest tubes, eventually stabilized, discharged to rehab and then went home, readmitted 10/5 with large esophageal tear 10/7 esophageal stent placed by cardiothoracic 10/15 stent reposition 10/16 extubated 10/20 reintubated with ultrasound showing left-sided pleural effusion-left-sided chest tube placed-by lights criteria exudative effusion 10/23 echo 60-65% 10/24 barium swallow showed persistent leak 10/28 transferred out of ICU 11/3 PEG tube placed 11/19 swelling of right arm showing on ultrasound SVT cephalic vein 11/22 chest x-ray?  Pneumonia done because of back pain-follow-up CT scan shows stent in place no mediastinal fluid collection small bilateral pleural effusions with bibasilar atelectasis pneumonia not excluded-scattered clusters nodular densities predominantly right upper and right lower lobes with nodule 11 mm as previously seen?  Mets?  Infection?  Aspiration 11/26 stent removed. Esophagram negative for esophageal leak.  11/27 getting diuresis again 11/28 duodenitis/enteritis.  GI consulted. Bloody emesis added carafate   11/29 trialing clears 12/1 NV again w/ hematemesis. Lasix  held ---  esophagram performed showing patent esophagus without perforation irregularity mid esophagus?  Stent diffuse narrowing lumen distal duodenum and proximal jejunum 12/2 EGD ischemic ulcers. Impression:  - Congested, inflamed, nodular, texture changed  mucosa in the esophagus. - Gastritis, Non-bleeding duodenal ulcers with a clean ulcer  base (Forrest Class III).- Non-bleeding jejunal ulcers with a clean ulcer   base (Forrest Class III)  - Findings suggestive of ischemic ulcers. Increased carafate  and cont BID  PPI  12/3 full liq diet  12/4 marked increased WOB. Acute and progressive w/ increased hypoxia and HTN. 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 12/17 off clev transition to iHD, transfer to floor  12/18: TDC placed 12/19 [name] assumed care, has distended and painful abdomen, KUB shows improvement so tube feeds resumed, Eliquis  resumed 12/22 hemoccult neg---transfused 1 U--- Eliquis  had to be held in the setting 12/23 vascular consulted secondary to lower extremities----x-ray shows congestion no pneumonia white count up-- 12/24 ABIs showed abnormal right and left toe brachial indices-no intervention from vascular required 12/26 severe respiratory distress needing nonrebreather 12/29, EP consulted for recurrent ventricular tachycardia, recurrent A-fib RVR> Amio gtt. 12/30, started on mexiletine 12/31, LFTs trending up, amiodarone  and mexiletine discontinued,  1/1: LFTs continue to worsen, HIDA scan ordered, palliative care consulted 1/2: HIDA scan negative for cholecystitis, holding tube feeds which could be contributing to cholestasis Restarting IV heparin  for persistent A-fib       1/3: AST ALT improving, alk phos still significantly elevated 1/5: switched IV Heparin  to Eliquis       Subjective: Patient seen and examined at bedside this morning He denies nausea vomiting abdominal pain chest pain cough Still continues to await placement Currently on 3 L of intranasal oxygen    Assessment and Plan:   Acute kidney injury, new ESRD Volume overload -sp CRRT 12/10-12/16, TDC placed  12/18 - ANA positive, C4 low, antihistone antibody positive, noted to have hematuria  as well  - Renal biopsy 12/16 noted 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, felt to be secondary to infection  - Completed course of antibiotics  - Currently on steroids per nephrology  - Consulted palliative care with all above ongoing issues and high risk of decompensation, wishes for full code and full scope for now   Nonspecific leukocytosis -Afebrile, has completed multiple rounds of antibiotics - Ongoing prednisone  could be contributing,  - with abnormal LFTs and mild abdominal tenderness chronic cholecystitis was felt to be a possibility, RUQ US  q/ GB wall thickening, CBD was normal, HIDA scan finally completed  negative for acute cholecystitis   Boerhaave syndrome Recurrent upper GI bleed - 10/7 had esophageal stent placed by CT surgery - 10/15, esophageal stent repositioned - 11/26: Esophageal stent removed, no further leak -Ischemic ulcer disease--GI and cardiothoracic aware-several CTs since 11/25---CT scan done 12/21 unremarkable, no Carafate  with HD, Maalox discontinued -Last esophagram as above 12/1 -Continue Protonix , changed to p.o. - No further bleeding now, Eliquis  restarted   Elevated LFT's -On CT abd from 12/21 with mild biliary ductal dilation vs mild periportal edema and tiny layering stone in GB neck - Right upper quadrant ultrasound was nonspecific, ascites and gallbladder wall thickening, CBD was normal, has mild epigastric and right sided tenderness on exam, HIDA scan negative for cholecystitis 1/2 - Held tube feeds in case that could be contributing to cholestasis - Amiodarone  discontinued 12/31, 1/1 mexiletine was discontinued due to predominant hepatic metabolism - off lipitor  and Tylenol  - Suspect could have had a shock liver type injury BP dropped to 90s 12/28-29, versus passive hepatic congestion from volume  overloaded state, LFTs slowly improving, alk phos still significantly elevated however no obstructive pattern on imaging - Slowly improving now Continue to monitor   Severe Malnutrition- BMI 22 evidence of weight loss -S/p PEG Dietitian recommends for oral diet Therefore PEG tube being removed   Normocytic anemia secondary to peptic ulcer disease,?  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 -does have IVC filter in place since March Lower limb ischemia ruled out 12.23 ABI rules out ischemia-appreciate VVS--no intervention needed Continue Eliquis    Insomnia - Continued on regimen of trazodone  Seroquel  and hydroxyzine    Diabetes mellitus type 2 Continue glucose monitoring Continue as needed insulin   Multiple bedsores and wounds anterior right and left foot and injury stage I to bottom -Continue Gerhardt twice daily Lidoderm  patch for back pain   VT - recurrent runs of VT 12/28 - Cards was following, amiodarone  and mexiletine discontinued with rising LFTs, continue low-dose Coreg , recommended Toprol  if he has recurrence   PEG tube Dietitian have recommended removal of patient PEG tube conceptus more than 6 weeks old and is not being used IR was consulted however they have deferred to cardiothoracic surgery since it was placed by them    DVT prophylaxis: eliquis    Code Status: FUll Code   Family Communication: none present   Disposition Plan: SNF if he improves      Examination:   General exam: Chronically ill-appearing, AAO x 2, no distress, irritable this morning HEENT: Positive JVD, right IJ HD catheter CVS: S1-S2, irregular  rhythm Lungs: Clear anteriorly, decreased breath sounds at the bases Abdomen: Mildly distended, soft, nontender, PEG tube noted Extremities: 1+ edema Psychiatry: Flat affect     Data Reviewed:       Latest Ref Rng & Units 04/26/2024    4:09 AM 04/24/2024    4:15 AM 04/22/2024    4:36 AM  CBC  WBC 4.0 - 10.5 K/uL 12.3  13.1  14.6   Hemoglobin 13.0 - 17.0 g/dL 7.7  7.6  7.8   Hematocrit 39.0 - 52.0 % 24.9  24.1  24.2   Platelets 150 - 400 K/uL 234  293  326        Latest Ref Rng & Units 04/26/2024    4:09 AM 04/25/2024    1:22 AM 04/24/2024    4:15 AM  BMP  Glucose 70 - 99 mg/dL 94  88  91   BUN 8 - 23 mg/dL 43  29  51   Creatinine 0.61 - 1.24 mg/dL 6.91  7.73  6.66   Sodium 135 - 145 mmol/L 136  134  137   Potassium 3.5 - 5.1 mmol/L 3.8  3.6  3.8   Chloride 98 - 111 mmol/L 97  97  99   CO2 22 - 32 mmol/L 28  30  27    Calcium  8.9 - 10.3 mg/dL 8.2  8.3  8.5        Vitals:   04/26/24 1108 04/26/24 1134 04/26/24 1206 04/26/24 1311  BP: 111/70 (!) 133/98 108/65 (!) 107/56  Pulse: 98 95 97 100  Resp: 17 19 19 20   Temp:    97.6 F (36.4 C)  TempSrc:      SpO2: 100% 100% 98% 100%  Weight:      Height:        Author: Drue ONEIDA Potter, MD 04/26/2024 1:57 PM  For on call review [email or web address].  "

## 2024-04-26 NOTE — Plan of Care (Signed)
" ° °  Medical records reviewed. Goals of care remain clear for all available life-prolonging measures. Patient's SNF bed remains available after PEG removal and insurance authorization. He has declined outpatient palliative care follow up.  PMT will follow peripherally for additional needs. Thank you for your referral and allowing PMT to assist in Mr. Mansfield Dann care.    Niki Payment, PA-C Palliative Medicine Team  Team Phone # [phone]   NO CHARGE   "

## 2024-04-26 NOTE — Progress Notes (Signed)
  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  He has completed rounds of antibiotics.  Mild interstitial fibrosis/tubular atrophy.  Given concern for infection we provided steroids but avoided other IS medications due to weighing risks/benefits on pred taper now with plans to wean quickly given the main concern is infection as a cause he was on minoxidil  which caries similar risk for AI kidney phenomena as hydralazine . Stopped this (last dose 12/22)  Continue HD on MWF schedule: HD next Wednesday Successful with HD in chair Wednesday Making a little bit more urine- encouraging sign HD today in the chair again  Petechial rash;             - 2/2 Iga. S/p steroids, improved   Acute on chronic CHF exacerbation;             - HD and diuretics as aboven daily torsemide    H/o DVT and PE:             - Anticoagulation per cardiology and primary service   Boorehave's syndrome             - s/p repair             - Has a PEG  - EGD 03/19/24 with ulcers  - PPI and pepcid    HTN  - Carvedilol , followed BPs with UF  Elevated LFTs  - HIDA negative 04/19/24  - ALP out of proportion to AST/ ALT--> ? Bone turnover and was WNL until 03/27/24.  Note Ca not high  Dispo: Likely SNF - pending placement as of 1/2, To go [facility] at  time of discharge   Subjective:    Seen.  Still upset and not feeling the best.    Objective:   BP (!) 102/59   Pulse 65   Temp 97.9 F (36.6 C)   Resp (!) 22   Ht 6' 1 (1.854 m)   Wt 69.2 kg   SpO2 100%   BMI 20.13 kg/m   Intake/Output Summary (Last 24 hours) at  04/26/2024 1024 Last data filed at 04/26/2024 0037 Gross per 24 hour  Intake 320 ml  Output 300 ml  Net 20 ml    Weight change:   Physical Exam: GEN: sitting in bed PULM: normal WOB, clear ant CV: Normal rate, regular rhythm ABD: + PEG, mild distention EXT: trace edema NEURO: sleeping Dialysis access: RIJ HD catheter- tunneled  Imaging: No results found.      Labs: BMET Recent Labs  Lab 04/20/24 [phone] 04/21/24 0529 04/22/24 0436 04/23/24 0322 04/24/24 0415 04/25/24 0122 04/26/24 0409  NA 134* 133* 135 135 137 134* 136  K 4.1 4.2 4.2 4.2 3.8 3.6 3.8  CL 95* 94* 97* 97* 99 97* 97*  CO2 31 28 29 30 27 30 28   GLUCOSE 91 98 89 93 91 88 94  BUN 49* 63* 73* 42* 51* 29* 43*  CREATININE 2.59* 3.29* 3.84* 2.58* 3.33* 2.26* 3.08*  CALCIUM  8.1* 8.4* 8.4* 8.4* 8.5* 8.3* 8.2*   CBC Recent Labs  Lab 04/21/24 0529 04/22/24 0436 04/24/24 0415 04/26/24 0409  WBC 15.2* 14.6* 13.1* 12.3*  HGB 7.7* 7.8*  7.6* 7.7*  HCT 23.7* 24.2* 24.1* 24.9*  MCV 81.7 83.2 86.4 89.2  PLT 320 326 293 234    Medications:     apixaban   5 mg Oral BID   carvedilol   3.125 mg Per Tube BID WC   Chlorhexidine  Gluconate Cloth  6 each Topical Daily   clonazePAM   0.5 mg Per Tube BID   famotidine   20 mg Per Tube Daily   feeding supplement (NEPRO CARB STEADY)  237 mL Oral BID BM   [name] butt cream   Topical BID   guaiFENesin   10 mL Per Tube Q4H   insulin  aspart  0-9 Units Subcutaneous Q4H   lidocaine   1 patch Transdermal Q24H   multivitamin  1 tablet Oral QHS   pantoprazole   40 mg Oral BID   predniSONE   10 mg Oral Q breakfast   saccharomyces boulardii  250 mg Per Tube BID   sodium chloride  flush  10-40 mL Intracatheter Q12H   torsemide   100 mg Oral Daily

## 2024-04-26 NOTE — TOC Progression Note (Addendum)
 Transition of Care [facility]) - Progression Note    Patient Details  Name: Billy [name] MRN: [id number] Date of Birth: [birth year]  Transition of Care [facility]) CM/SW Contact  Gwenn Frieze [place], [place] Phone Number: 04/26/2024, 9:13 AM  Clinical Narrative:  Per MD, pt is stable for dc to SNF. Per EMR review, shara has not been submitted. Voicemail left for [place] with [employer] requesting auth for [employer]. Will provide updates as available.   7: Spoke to Quandra with [facility] who reports they rescinded their bed offer although no TOC documentation notes this change. Refaxed SNF referrals in attempt to arrange new placement that can accommodate pt's HD clinic and schedule HD MWF [facility]. Auth request canceled with Jori at [facility]. MD updated.  1130: Spoke to [name] with [facility] who reports they did not rescind bed offer and their ability to accept pt was contingent on improved respiratory status. Provided update that pt is on [facility]. [name] also reports their DON is concerned about meeting nutritional requirements with PO and nocturnal feeds via PEG--MD and RD updated. [name] reports she will visit with pt today and update SW re ability to offer bed.    1430: Per RD, pt no longer needs supplemental tube feeds and recommends PEG removal. Spoke to [name] at [facility] who confirmed they are able to accept pt if PEG removed and auth received. MD updated.   Frieze Gwenn, MSW, LCSW [phone] (coverage)      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, SNF Pending bed offer               Expected Discharge Plan and Services In-house Referral: Clinical Social Work Discharge Planning Services: [employer] Consult Post Acute Care Choice: Skilled Nursing Facility Living arrangements for the past 2 months: Single Family Home                                        Social Drivers of Health (SDOH) Interventions SDOH Screenings   Food Insecurity: No Food Insecurity (01/22/2024)  Housing: High Risk (01/22/2024)  Transportation Needs: No Transportation Needs (01/22/2024)  Utilities: Not At Risk (01/22/2024)  Social Connections: Socially Isolated (01/22/2024)  Tobacco Use: Low Risk (03/19/2024)    Readmission Risk Interventions    06/27/2023    1:52 PM 10/14/2022   11:29 AM  Readmission Risk Prevention Plan  Post Dischage Appt  Complete  Medication Screening  Complete  Transportation Screening Complete Complete  HRI or Home Care Consult Complete   Social Work Consult for Recovery Care Planning/Counseling Complete   Palliative Care Screening Not Applicable   Medication Review Oceanographer) Referral to Pharmacy

## 2024-04-26 NOTE — Progress Notes (Signed)
 PT Cancellation Note  Patient Details Name: Billy [name] MRN: [id number] DOB: 01-Dec-1950   Cancelled Treatment:    Reason Eval/Treat Not Completed: Patient at procedure or test/unavailable (HD). Will follow up for PT treatment as schedule permits.  Jeremi Losito, PT, DPT Acute Rehabilitation Services  Personal: Secure Chat Rehab Office: [phone]  Darice LITTIE Almas 04/26/2024, 9:22 AM

## 2024-04-26 NOTE — Progress Notes (Addendum)
 Nutrition Follow-up  DOCUMENTATION CODES:  Severe malnutrition in context of acute illness/injury  INTERVENTION:  Recommend PEG removal now that pt no longer needs supplemental tube feeds  Encouraged continued adequate intake of meals and supplements to meet calorie and protein needs Recommend advancing diet past DYS 3 to provide increased options for pt which has become part of his barriers to eating Allow family to bring in meals/snacks that pt enjoys  Added Ensure Plus High Protein po BID, each supplement provides 350 kcal and 20 grams of protein. Prefers vanilla flavor  Daily RenaVit   NUTRITION DIAGNOSIS:  Severe Malnutrition related to acute illness (may have chronic component due to time frame of reported wt loss) as evidenced by severe fat depletion, moderate muscle depletion; ongoing.  GOAL:  Patient will meet greater than or equal to 90% of their needs; progressing  MONITOR:  PO intake, Supplement acceptance, TF tolerance, Labs, I & O's  REASON FOR ASSESSMENT:  Consult Enteral/tube feeding initiation and management  ASSESSMENT:  74 yo male admitted post several days of N/V followed by sudden onset of severe epigastric pain radiating to chest and back. Pt found to have small esophageal perforation above GE junction. PMH includes CAD, chronic HFpEF, HTN, HLD, DVT, CVA, PAF, CKD 3b.  Pt has been progressing over the last week. Pt tolerated HD treatment well today and did not appear fatigued following treatment. Pt resting in bed at time of follow up, but able to engage in meaningful conversations regarding care. Pt expresses that his appetite has improved and he does feel hungry and eager to eat but sometimes the food he orders is not what he asked for or ends up being dry so he won't eat it. When the food he orders is what he asked for, he states he is able to eat most of the tray. Pt discussed that when he was at rehab previously, the food was better than the hospital food  and he was eating really well and making progress. Pt also agreeable to Ensure shakes because he likes vanilla and chocolate but refuses the berry Nepro shakes. Pt no longer having issues with n/v or abdominal pain. Pt states pain has been well managed overall if medications are given on time.   Encouraged continued intake of meals and supplements to meet nutrition needs. Discussed with MD if PEG can come out prior to discharge and if diet can be advanced past DYS 3 to provide more options. TOC working on discharge and requested conversation about pt meeting nutrition needs, discussed that pt does not need tube feeds and is agreeable to ONS to help meet needs.   Average Meal Completion: 1/5-1/8: 40% average intake x 5 recorded meals Recorded 1-2 Ensure shakes in last few days  Nutritionally Relevant Medications: Pepcid  SSI 0-9 units q4h RenaVit Protonix  Prednisone    Labs reviewed: CBG x 24 h: 83-135 mg/dL BUN 43 Cr 6.91 J8r 6.0  Weight Trends: 12/23 79.1 kg 12/30 76.1 kg 1/2 77.1 kg 1/4 74.8 kg 1/5 71.7 kg **weight had been fairly stable then saw a 3 kg loss in 1 day which is likely error (may be contributing to pt changing units/ beds and differences in bedscales). Otherwise appears stable  Diet Order:   Diet Order             DIET DYS 3 Room service appropriate? Yes; Fluid consistency: Thin  Diet effective now  EDUCATION NEEDS:  Education needs have been addressed  Skin:  Skin Assessment: Reviewed RN Assessment Skin Integrity Issues:: DTI DTI: L thigh, L knee, bilateral buttocks Stage I: n/a  Last BM:  1/1 type 6  Height:  Ht Readings from Last 1 Encounters:  04/22/24 6' 1 (1.854 m)   Weight:  Wt Readings from Last 1 Encounters:  04/25/24 69.2 kg   Ideal Body Weight:  83.6 kg  BMI:  Body mass index is 20.13 kg/m.  Estimated Nutritional Needs:  Kcal:  2300-2500 kcals Protein:  110-130g Fluid:  1L + UOP   Josette Glance, MS,  RDN, LDN Clinical Dietitian I Please reach out via secure chat

## 2024-04-27 DIAGNOSIS — K223 Perforation of esophagus: Secondary | ICD-10-CM | POA: Diagnosis not present

## 2024-04-27 LAB — COMPREHENSIVE METABOLIC PANEL WITH GFR
ALT: 129 U/L — ABNORMAL HIGH (ref 0–44)
AST: 99 U/L — ABNORMAL HIGH (ref 15–41)
Albumin: 2.1 g/dL — ABNORMAL LOW (ref 3.5–5.0)
Alkaline Phosphatase: 1477 U/L — ABNORMAL HIGH (ref 38–126)
Anion gap: 7 (ref 5–15)
BUN: 26 mg/dL — ABNORMAL HIGH (ref 8–23)
CO2: 29 mmol/L (ref 22–32)
Calcium: 7.9 mg/dL — ABNORMAL LOW (ref 8.9–10.3)
Chloride: 96 mmol/L — ABNORMAL LOW (ref 98–111)
Creatinine, Ser: 2.86 mg/dL — ABNORMAL HIGH (ref 0.61–1.24)
GFR, Estimated: 23 mL/min — ABNORMAL LOW
Glucose, Bld: 101 mg/dL — ABNORMAL HIGH (ref 70–99)
Potassium: 3.6 mmol/L (ref 3.5–5.1)
Sodium: 133 mmol/L — ABNORMAL LOW (ref 135–145)
Total Bilirubin: 1.1 mg/dL (ref 0.0–1.2)
Total Protein: 5.2 g/dL — ABNORMAL LOW (ref 6.5–8.1)

## 2024-04-27 LAB — CBC
HCT: 25.1 % — ABNORMAL LOW (ref 39.0–52.0)
Hemoglobin: 7.7 g/dL — ABNORMAL LOW (ref 13.0–17.0)
MCH: 27.7 pg (ref 26.0–34.0)
MCHC: 30.7 g/dL (ref 30.0–36.0)
MCV: 90.3 fL (ref 80.0–100.0)
Platelets: 208 K/uL (ref 150–400)
RBC: 2.78 MIL/uL — ABNORMAL LOW (ref 4.22–5.81)
RDW: 21.1 % — ABNORMAL HIGH (ref 11.5–15.5)
WBC: 12 K/uL — ABNORMAL HIGH (ref 4.0–10.5)
nRBC: 0 % (ref 0.0–0.2)

## 2024-04-27 LAB — GLUCOSE, CAPILLARY
Glucose-Capillary: 102 mg/dL — ABNORMAL HIGH (ref 70–99)
Glucose-Capillary: 120 mg/dL — ABNORMAL HIGH (ref 70–99)
Glucose-Capillary: 122 mg/dL — ABNORMAL HIGH (ref 70–99)
Glucose-Capillary: 123 mg/dL — ABNORMAL HIGH (ref 70–99)
Glucose-Capillary: 140 mg/dL — ABNORMAL HIGH (ref 70–99)
Glucose-Capillary: 143 mg/dL — ABNORMAL HIGH (ref 70–99)

## 2024-04-27 NOTE — Plan of Care (Signed)
 " Problem: Education: Goal: Knowledge of General Education information will improve Description: Including pain rating scale, medication(s)/side effects and non-pharmacologic comfort measures Outcome: Progressing   Problem: Health Behavior/Discharge Planning: Goal: Ability to manage health-related needs will improve Outcome: Progressing   Problem: Clinical Measurements: Goal: Will remain free from infection Outcome: Progressing Goal: Diagnostic test results will improve Outcome: Progressing Goal: Respiratory complications will improve Outcome: Progressing Goal: Cardiovascular complication will be avoided Outcome: Progressing   Problem: Activity: Goal: Risk for activity intolerance will decrease Outcome: Progressing   Problem: Nutrition: Goal: Adequate nutrition will be maintained Outcome: Progressing   Problem: Coping: Goal: Level of anxiety will decrease Outcome: Progressing   Problem: Elimination: Goal: Will not experience complications related to bowel motility Outcome: Progressing Goal: Will not experience complications related to urinary retention Outcome: Progressing   Problem: Pain Managment: Goal: General experience of comfort will improve and/or be controlled Outcome: Progressing   Problem: Safety: Goal: Ability to remain free from injury will improve Outcome: Progressing   Problem: Skin Integrity: Goal: Risk for impaired skin integrity will decrease Outcome: Progressing   Problem: Education: Goal: Ability to describe self-care measures that may prevent or decrease complications (Diabetes Survival Skills Education) will improve Outcome: Progressing   Problem: Coping: Goal: Ability to adjust to condition or change in health will improve Outcome: Progressing   Problem: Fluid Volume: Goal: Ability to maintain a balanced intake and output will improve Outcome: Progressing   Problem: Health Behavior/Discharge Planning: Goal: Ability to identify and  utilize available resources and services will improve Outcome: Progressing Goal: Ability to manage health-related needs will improve Outcome: Progressing   Problem: Metabolic: Goal: Ability to maintain appropriate glucose levels will improve Outcome: Progressing   Problem: Nutritional: Goal: Maintenance of adequate nutrition will improve Outcome: Progressing Goal: Progress toward achieving an optimal weight will improve Outcome: Progressing   Problem: Skin Integrity: Goal: Risk for impaired skin integrity will decrease Outcome: Progressing   Problem: Tissue Perfusion: Goal: Adequacy of tissue perfusion will improve Outcome: Progressing   Problem: Activity: Goal: Ability to tolerate increased activity will improve Outcome: Progressing   Problem: Respiratory: Goal: Ability to maintain a clear airway and adequate ventilation will improve Outcome: Progressing   Problem: Role Relationship: Goal: Method of communication will improve Outcome: Progressing   Problem: Education: Goal: Ability to describe self-care measures that may prevent or decrease complications (Diabetes Survival Skills Education) will improve Outcome: Progressing Goal: Individualized Educational Video(s) Outcome: Progressing   Problem: Coping: Goal: Ability to adjust to condition or change in health will improve Outcome: Progressing   Problem: Fluid Volume: Goal: Ability to maintain a balanced intake and output will improve Outcome: Progressing   Problem: Health Behavior/Discharge Planning: Goal: Ability to identify and utilize available resources and services will improve Outcome: Progressing Goal: Ability to manage health-related needs will improve Outcome: Progressing   Problem: Metabolic: Goal: Ability to maintain appropriate glucose levels will improve Outcome: Progressing   Problem: Nutritional: Goal: Maintenance of adequate nutrition will improve Outcome: Progressing Goal: Progress  toward achieving an optimal weight will improve Outcome: Progressing   Problem: Skin Integrity: Goal: Risk for impaired skin integrity will decrease Outcome: Progressing   Problem: Tissue Perfusion: Goal: Adequacy of tissue perfusion will improve Outcome: Progressing   Problem: Education: Goal: Knowledge of disease and its progression will improve Outcome: Progressing   Problem: Health Behavior/Discharge Planning: Goal: Ability to manage health-related needs will improve Outcome: Progressing   Problem: Clinical Measurements: Goal: Complications related to the disease process or treatment  will be avoided or minimized Outcome: Progressing Goal: Dialysis access will remain free of complications Outcome: Progressing   Problem: Activity: Goal: Activity intolerance will improve Outcome: Progressing   "

## 2024-04-27 NOTE — Progress Notes (Signed)
" °   °  [address] [place] [address]             [phone]       PEG tube remove Regular dressing changes  Gene Colee O Noriel Guthrie   "

## 2024-04-27 NOTE — TOC Progression Note (Addendum)
 Transition of Care [facility]) - Progression Note    Patient Details  Name: Billy [name] MRN: [id number] Date of Birth: [birth year]  Transition of Care [facility]) CM/SW Contact  Gwenn Frieze [place], [place] Phone Number: 04/27/2024, 11:19 AM  Clinical Narrative:  Per CTS, PEG has been removed. [employer] auth request submitted for [place] and [facility], spoke to [place]. Anticipate determination by Monday. [name] with [facility] updated. SW will follow.   Frieze Gwenn, MSW, LCSW [phone] (coverage)       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, SNF Pending bed offer               Expected Discharge Plan and Services In-house Referral: Clinical Social Work Discharge Planning Services: [employer] Consult Post Acute Care Choice: Skilled Nursing Facility Living arrangements for the past 2 months: Single Family Home                                       Social Drivers of Health (SDOH) Interventions SDOH Screenings   Food Insecurity: No Food Insecurity (01/22/2024)  Housing: High Risk (01/22/2024)  Transportation Needs: No Transportation Needs (01/22/2024)  Utilities: Not At Risk (01/22/2024)  Social Connections: Socially Isolated (01/22/2024)  Tobacco Use: Low Risk (03/19/2024)    Readmission Risk Interventions    06/27/2023    1:52 PM 10/14/2022   11:29 AM  Readmission Risk Prevention Plan  Post Dischage Appt  Complete  Medication Screening  Complete  Transportation Screening Complete Complete  HRI or Home Care Consult Complete   Social Work Consult for Recovery Care Planning/Counseling Complete   Palliative Care Screening Not Applicable   Medication Review Oceanographer) Referral to Pharmacy

## 2024-04-27 NOTE — Progress Notes (Signed)
 Patient insists he can eat food but chokes on his medicine. Per report, patient is insisting all medications be given through his PEG tube.  He refuses to try by mouth.  He states he chokes when he swallows his medications.  He had no issues with eating his turkey sandwich and bag of pretzels.  Dr. Charlton made aware because PO Protonix  cannot be crushed.  PO Protonix  changed to IV and given to patient. Suzen Ice RN

## 2024-04-27 NOTE — Progress Notes (Signed)
 " Progress Note   Patient: Billy [name] FMW:[id number] DOB: 1950/07/06 DOA: 01/21/2024     97 DOS: the patient was seen and examined on 04/27/2024     Brief hospital course:   Chronically ill 73/M prolonged hospitalization from 3/10-4/3 with cardiac arrest, A-fib, DVTs, hematochezia, IVC filter, chest tubes, eventually stabilized, discharged to rehab and then went home, readmitted 10/5 with large esophageal tear 10/7 esophageal stent placed by cardiothoracic 10/15 stent reposition 10/16 extubated 10/20 reintubated with ultrasound showing left-sided pleural effusion-left-sided chest tube placed-by lights criteria exudative effusion 10/23 echo 60-65% 10/24 barium swallow showed persistent leak 10/28 transferred out of ICU 11/3 PEG tube placed 11/19 swelling of right arm showing on ultrasound SVT cephalic vein 11/22 chest x-ray?  Pneumonia done because of back pain-follow-up CT scan shows stent in place no mediastinal fluid collection small bilateral pleural effusions with bibasilar atelectasis pneumonia not excluded-scattered clusters nodular densities predominantly right upper and right lower lobes with nodule 11 mm as previously seen?  Mets?  Infection?  Aspiration 11/26 stent removed. Esophagram negative for esophageal leak.  11/27 getting diuresis again 11/28 duodenitis/enteritis.  GI consulted. Bloody emesis added carafate   11/29 trialing clears 12/1 NV again w/ hematemesis. Lasix  held ---  esophagram performed showing patent esophagus without perforation irregularity mid esophagus?  Stent diffuse narrowing lumen distal duodenum and proximal jejunum 12/2 EGD ischemic ulcers. Impression:  - Congested, inflamed, nodular, texture changed  mucosa in the esophagus. - Gastritis, Non-bleeding duodenal ulcers with a clean ulcer  base (Forrest Class III).- Non-bleeding jejunal ulcers with a clean ulcer   base (Forrest Class III)  - Findings suggestive of ischemic ulcers. Increased carafate  and cont  BID PPI  12/3 full liq diet  12/4 marked increased WOB. Acute and progressive w/ increased hypoxia and HTN. 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 12/17 off clev transition to iHD, transfer to floor  12/18: TDC placed 12/19 [name] assumed care, has distended and painful abdomen, KUB shows improvement so tube feeds resumed, Eliquis  resumed 12/22 hemoccult neg---transfused 1 U--- Eliquis  had to be held in the setting 12/23 vascular consulted secondary to lower extremities----x-ray shows congestion no pneumonia white count up-- 12/24 ABIs showed abnormal right and left toe brachial indices-no intervention from vascular required 12/26 severe respiratory distress needing nonrebreather 12/29, EP consulted for recurrent ventricular tachycardia, recurrent A-fib RVR> Amio gtt. 12/30, started on mexiletine 12/31, LFTs trending up, amiodarone  and mexiletine discontinued,  1/1: LFTs continue to worsen, HIDA scan ordered, palliative care consulted 1/2: HIDA scan negative for cholecystitis, holding tube feeds which could be contributing to cholestasis Restarting IV heparin  for persistent A-fib       1/3: AST ALT improving, alk phos still significantly elevated 1/5: switched IV Heparin  to Eliquis       Subjective: Patient seen and examined at bedside this morning He denies nausea vomiting abdominal pain chest pain cough Patient continues to await placement Currently on 3 L of intranasal oxygen    Assessment and Plan:   Acute kidney injury, new ESRD Volume overload -sp CRRT 12/10-12/16, TDC  placed 12/18 - ANA positive, C4 low, antihistone antibody positive, noted to have  hematuria as well  - Renal biopsy 12/16 noted 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, felt to be secondary to infection  - Completed course of antibiotics  - Currently on steroids per nephrology  - Consulted palliative care with all above ongoing issues and high risk of decompensation, wishes for full code and full scope for now   Nonspecific leukocytosis -Afebrile, has completed multiple rounds of antibiotics - Ongoing prednisone  could be contributing,  - with abnormal LFTs and mild abdominal tenderness chronic cholecystitis was felt to be a possibility, RUQ US  q/ GB wall thickening, CBD was normal, HIDA scan finally completed  negative for acute cholecystitis   Boerhaave syndrome Recurrent upper GI bleed - 10/7 had esophageal stent placed by CT surgery - 10/15, esophageal stent repositioned - 11/26: Esophageal stent removed, no further leak -Ischemic ulcer disease--GI and cardiothoracic aware-several CTs since 11/25---CT scan done 12/21 unremarkable, no Carafate  with HD, Maalox discontinued -Last esophagram as above 12/1 -Continue Protonix , changed to p.o. - No further bleeding now, Eliquis  restarted   Elevated LFT's -On CT abd from 12/21 with mild biliary ductal dilation vs mild periportal edema and tiny layering stone in GB neck - Right upper quadrant ultrasound was nonspecific, ascites and gallbladder wall thickening, CBD was normal, has mild epigastric and right sided tenderness on exam, HIDA scan negative for cholecystitis 1/2 - Held tube feeds in case that could be contributing to cholestasis - Amiodarone  discontinued 12/31, 1/1 mexiletine was discontinued due to predominant hepatic metabolism - off lipitor  and Tylenol  - Suspect could have had a shock liver type injury BP dropped to 90s 12/28-29, versus passive hepatic congestion from volume  overloaded state, LFTs slowly improving, alk phos still significantly elevated however no obstructive pattern on imaging - Slowly improving now Continue to monitor   Severe Malnutrition- BMI 22 evidence of weight loss -S/p PEG Dietitian recommends for oral diet PEG tube have been removed as recommended by dietitian   Normocytic anemia secondary to peptic ulcer disease,?  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 -does have IVC filter in place since March Lower limb ischemia ruled out 12.23 ABI rules out ischemia-appreciate VVS--no intervention needed Continue Eliquis    Insomnia - Continued on regimen of trazodone  Seroquel  and hydroxyzine    Diabetes mellitus type 2 Continue glucose monitoring Continue as needed insulin    Multiple bedsores and wounds anterior right and left foot and injury stage I to bottom -Continue Gerhardt twice daily Lidoderm  patch for back pain   VT - recurrent runs of VT 12/28 - Cards was following, amiodarone  and mexiletine discontinued with rising LFTs, continue low-dose Coreg , recommended Toprol  if he has recurrence   PEG tube Dietitian have recommended removal of patient PEG tube conceptus more than 54 weeks old and is not being used PEG tube removed on 04/27/2024     DVT prophylaxis: eliquis    Code Status: FUll Code   Family Communication: none present   Disposition Plan: SNF if he improves, TOC working on this     Examination:   General exam: Chronically ill-appearing, AAO x 2, no distress, irritable this morning HEENT: Positive JVD, right IJ HD catheter CVS: S1-S2, irregular rhythm  Lungs: Clear anteriorly,  decreased breath sounds at the bases Abdomen: Mildly distended, soft, nontender, PEG tube noted Extremities: 1+ edema Psychiatry: Flat affect     Data Reviewed:   Vitals:   04/26/24 1722 04/26/24 2045 04/27/24 0425 04/27/24 0755  BP: (!) 94/49 (!) 111/53 (!) 97/51 (!) 110/58  Pulse: 87 86 65 79  Resp: 18 18 18 18   Temp: 97.7 F (36.5 C) 98.3 F (36.8 C) 98.2 F (36.8 C) 97.7 F (36.5 C)  TempSrc: Oral     SpO2: 100% 100% 100% 100%  Weight:      Height:          Latest Ref Rng & Units 04/27/2024    3:00 AM 04/26/2024    4:09 AM 04/24/2024    4:15 AM  CBC  WBC 4.0 - 10.5 K/uL 12.0  12.3  13.1   Hemoglobin 13.0 - 17.0 g/dL 7.7  7.7  7.6   Hematocrit 39.0 - 52.0 % 25.1  24.9  24.1   Platelets 150 - 400 K/uL 208  234  293        Latest Ref Rng & Units 04/27/2024    3:00 AM 04/26/2024    4:09 AM 04/25/2024    1:22 AM  BMP  Glucose 70 - 99 mg/dL 898  94  88   BUN 8 - 23 mg/dL 26  43  29   Creatinine 0.61 - 1.24 mg/dL 7.13  6.91  7.73   Sodium 135 - 145 mmol/L 133  136  134   Potassium 3.5 - 5.1 mmol/L 3.6  3.8  3.6   Chloride 98 - 111 mmol/L 96  97  97   CO2 22 - 32 mmol/L 29  28  30    Calcium  8.9 - 10.3 mg/dL 7.9  8.2  8.3      Author: Drue ONEIDA Potter, MD 04/27/2024 10:15 AM  For on call review [email or web address].  "

## 2024-04-27 NOTE — Progress Notes (Signed)
 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  He has completed rounds of antibiotics.  Mild interstitial fibrosis/tubular atrophy.  Given concern for infection we provided steroids but avoided other IS medications due to weighing risks/benefits on pred taper now with plans to wean quickly given the main concern is infection as a cause he was on minoxidil  which caries similar risk for AI kidney phenomena as hydralazine . Stopped this (last dose 12/22)  Continue HD on MWF schedule: HD next Wednesday Successful with HD in chair Wednesday Making a little bit more urine- encouraging sign Tolerating HD in the chair   Petechial rash;             - 2/2 Iga. S/p steroids, improved   Acute on chronic CHF exacerbation;             - HD and diuretics as aboven daily torsemide    H/o DVT and PE:             - Anticoagulation per cardiology and primary service   Boorehave's syndrome             - s/p repair             - Has a PEG  - EGD 03/19/24 with ulcers  - PPI and pepcid    HTN  - Carvedilol , followed BPs with UF  Elevated LFTs  - HIDA negative 04/19/24  - MRCP done too- negative  Dispo: Likely SNF - pending placement as of 1/2, To go to [facility] at  time of discharge   Subjective:    Seen.  Sleeping, looks like mid- lunch.     Objective:   BP (!) 110/58 (BP Location: Left Arm)   Pulse 79   Temp 97.7 F (36.5 C)   Resp 18   Ht 6' 1 (1.854 m)   Wt 69.2 kg   SpO2 100%   BMI 20.13 kg/m   Intake/Output Summary (Last 24 hours) at 04/27/2024 1346 Last data filed at 04/27/2024 0800 Gross per 24  hour  Intake 960 ml  Output 400 ml  Net 560 ml    Weight change:   Physical Exam: GEN: sitting in bed PULM: normal WOB, clear ant CV: Normal rate, regular rhythm JAI:fpoi distention EXT: trace edema NEURO: sleeping Dialysis access: RIJ HD catheter- tunneled  Imaging: No results found.      Labs: BMET Recent Labs  Lab 04/21/24 0529 04/22/24 0436 04/23/24 0322 04/24/24 0415 04/25/24 0122 04/26/24 0409 04/27/24 0300  NA 133* 135 135 137 134* 136 133*  K 4.2 4.2 4.2 3.8 3.6 3.8 3.6  CL 94* 97* 97* 99 97* 97* 96*  CO2 28 29 30 27 30 28 29   GLUCOSE 98 89 93 91 88 94 101*  BUN 63* 73* 42* 51* 29* 43* 26*  CREATININE 3.29* 3.84* 2.58* 3.33* 2.26* 3.08* 2.86*  CALCIUM  8.4* 8.4* 8.4* 8.5* 8.3* 8.2* 7.9*   CBC Recent Labs  Lab 04/22/24 0436 04/24/24 0415 04/26/24 0409 04/27/24 0300  WBC 14.6* 13.1* 12.3* 12.0*  HGB 7.8* 7.6* 7.7* 7.7*  HCT 24.2* 24.1* 24.9* 25.1*  MCV 83.2 86.4 89.2 90.3  PLT 326  293 234 208    Medications:     apixaban   5 mg Per Tube BID   carvedilol   3.125 mg Per Tube BID WC   Chlorhexidine  Gluconate Cloth  6 each Topical Daily   clonazePAM   0.5 mg Per Tube BID   famotidine   20 mg Per Tube Daily   feeding supplement  237 mL Oral BID BM   [name] butt cream   Topical BID   guaiFENesin   10 mL Per Tube Q4H   insulin  aspart  0-9 Units Subcutaneous Q4H   lidocaine   1 patch Transdermal Q24H   multivitamin  1 tablet Per Tube QHS   pantoprazole   40 mg Oral BID   saccharomyces boulardii  250 mg Per Tube BID   sodium chloride  flush  10-40 mL Intracatheter Q12H   torsemide   100 mg Per Tube Daily

## 2024-04-28 DIAGNOSIS — K223 Perforation of esophagus: Secondary | ICD-10-CM | POA: Diagnosis not present

## 2024-04-28 LAB — GLUCOSE, CAPILLARY
Glucose-Capillary: 106 mg/dL — ABNORMAL HIGH (ref 70–99)
Glucose-Capillary: 108 mg/dL — ABNORMAL HIGH (ref 70–99)
Glucose-Capillary: 112 mg/dL — ABNORMAL HIGH (ref 70–99)
Glucose-Capillary: 124 mg/dL — ABNORMAL HIGH (ref 70–99)
Glucose-Capillary: 137 mg/dL — ABNORMAL HIGH (ref 70–99)
Glucose-Capillary: 85 mg/dL (ref 70–99)

## 2024-04-28 LAB — BASIC METABOLIC PANEL WITH GFR
Anion gap: 10 (ref 5–15)
BUN: 42 mg/dL — ABNORMAL HIGH (ref 8–23)
CO2: 27 mmol/L (ref 22–32)
Calcium: 8.2 mg/dL — ABNORMAL LOW (ref 8.9–10.3)
Chloride: 99 mmol/L (ref 98–111)
Creatinine, Ser: 3.6 mg/dL — ABNORMAL HIGH (ref 0.61–1.24)
GFR, Estimated: 17 mL/min — ABNORMAL LOW
Glucose, Bld: 121 mg/dL — ABNORMAL HIGH (ref 70–99)
Potassium: 3.7 mmol/L (ref 3.5–5.1)
Sodium: 135 mmol/L (ref 135–145)

## 2024-04-28 LAB — CBC
HCT: 25.1 % — ABNORMAL LOW (ref 39.0–52.0)
Hemoglobin: 7.8 g/dL — ABNORMAL LOW (ref 13.0–17.0)
MCH: 27.4 pg (ref 26.0–34.0)
MCHC: 31.1 g/dL (ref 30.0–36.0)
MCV: 88.1 fL (ref 80.0–100.0)
Platelets: 208 K/uL (ref 150–400)
RBC: 2.85 MIL/uL — ABNORMAL LOW (ref 4.22–5.81)
RDW: 20.7 % — ABNORMAL HIGH (ref 11.5–15.5)
WBC: 10.7 K/uL — ABNORMAL HIGH (ref 4.0–10.5)
nRBC: 0 % (ref 0.0–0.2)

## 2024-04-28 MED ORDER — RENA-VITE PO TABS
1.0000 | ORAL_TABLET | Freq: Every day | ORAL | Status: DC
  Administered 2024-04-28 – 2024-04-29 (×2): 1 via ORAL
  Filled 2024-04-28 (×2): qty 1

## 2024-04-28 MED ORDER — GUAIFENESIN 100 MG/5ML PO LIQD
10.0000 mL | ORAL | Status: DC
  Administered 2024-04-28 – 2024-04-29 (×3): 10 mL via ORAL
  Filled 2024-04-28 (×4): qty 15

## 2024-04-28 MED ORDER — HYDROXYZINE HCL 25 MG PO TABS: 25.0000 mg | ORAL_TABLET | Freq: Three times a day (TID) | ORAL | Status: DC | PRN

## 2024-04-28 MED ORDER — ONDANSETRON HCL 4 MG/2ML IJ SOLN
4.0000 mg | Freq: Four times a day (QID) | INTRAMUSCULAR | Status: DC | PRN
  Administered 2024-04-28: 4 mg via INTRAVENOUS
  Filled 2024-04-28: qty 2

## 2024-04-28 MED ORDER — CARVEDILOL 3.125 MG PO TABS
3.1250 mg | ORAL_TABLET | Freq: Two times a day (BID) | ORAL | Status: DC
  Administered 2024-04-28 – 2024-04-29 (×3): 3.125 mg via ORAL
  Filled 2024-04-28 (×3): qty 1

## 2024-04-28 MED ORDER — OXYCODONE HCL 5 MG PO TABS
5.0000 mg | ORAL_TABLET | ORAL | Status: DC | PRN
  Administered 2024-04-28 – 2024-04-29 (×5): 10 mg via ORAL
  Filled 2024-04-28 (×5): qty 2

## 2024-04-28 MED ORDER — INSULIN ASPART 100 UNIT/ML IJ SOLN
0.0000 [IU] | Freq: Three times a day (TID) | INTRAMUSCULAR | Status: DC
  Administered 2024-04-29: 1 [IU] via SUBCUTANEOUS
  Filled 2024-04-28: qty 1

## 2024-04-28 MED ORDER — INSULIN ASPART 100 UNIT/ML IJ SOLN: 0.0000 [IU] | Freq: Every day | INTRAMUSCULAR | Status: DC

## 2024-04-28 MED ORDER — APIXABAN 5 MG PO TABS
5.0000 mg | ORAL_TABLET | Freq: Two times a day (BID) | ORAL | Status: DC
  Administered 2024-04-28 – 2024-04-29 (×4): 5 mg via ORAL
  Filled 2024-04-28 (×4): qty 1

## 2024-04-28 MED ORDER — FAMOTIDINE 20 MG PO TABS
20.0000 mg | ORAL_TABLET | Freq: Every day | ORAL | Status: DC
  Administered 2024-04-28 – 2024-04-29 (×2): 20 mg via ORAL
  Filled 2024-04-28 (×2): qty 1

## 2024-04-28 MED ORDER — POLYETHYLENE GLYCOL 3350 17 G PO PACK: 17.0000 g | PACK | Freq: Every day | ORAL | Status: DC | PRN

## 2024-04-28 MED ORDER — TORSEMIDE 20 MG PO TABS
100.0000 mg | ORAL_TABLET | Freq: Every day | ORAL | Status: DC
  Administered 2024-04-28 – 2024-04-29 (×2): 100 mg via ORAL
  Filled 2024-04-28 (×2): qty 5

## 2024-04-28 MED ORDER — CLONAZEPAM 0.5 MG PO TABS
0.5000 mg | ORAL_TABLET | Freq: Two times a day (BID) | ORAL | Status: DC
  Administered 2024-04-28 – 2024-04-29 (×4): 0.5 mg via ORAL
  Filled 2024-04-28 (×4): qty 1

## 2024-04-28 MED ORDER — TRAZODONE HCL 50 MG PO TABS
50.0000 mg | ORAL_TABLET | Freq: Every evening | ORAL | Status: DC | PRN
  Administered 2024-04-28: 50 mg via ORAL
  Filled 2024-04-28: qty 1

## 2024-04-28 MED ORDER — SALINE SPRAY 0.65 % NA SOLN
1.0000 | NASAL | Status: DC | PRN
  Administered 2024-04-28: 1 via NASAL
  Filled 2024-04-28: qty 44

## 2024-04-28 MED ORDER — SACCHAROMYCES BOULARDII 250 MG PO CAPS
250.0000 mg | ORAL_CAPSULE | Freq: Two times a day (BID) | ORAL | Status: DC
  Administered 2024-04-28 – 2024-04-29 (×4): 250 mg via ORAL
  Filled 2024-04-28 (×4): qty 1

## 2024-04-28 NOTE — Progress Notes (Signed)
 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 04/04/25 ANA +, C4 low, + hematuria, anti-histone Ab +. 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  He has completed rounds of antibiotics.  Mild interstitial fibrosis/tubular atrophy.  Given concern for infection we provided steroids but avoided other IS medications due to weighing risks/benefits on pred taper now with plans to wean quickly given the main concern is infection as a cause he was on minoxidil  which caries similar risk for AI kidney phenomena as hydralazine . Stopped this (last dose 12/22)  Continue HD on MWF schedule Successful with HD in chair Wednesday Making a little bit more urine- encouraging sign- will d/c Foley and do bladder scans Tolerating HD in the chair Next HD tomorrow   Petechial rash;             - 2/2 Iga. S/p steroids, improved   Acute on chronic CHF exacerbation;             - HD and diuretics as aboven daily torsemide    H/o DVT and PE:             - Anticoagulation per cardiology and primary service   Boorehave's syndrome             - s/p repair             - Has a PEG  - EGD 03/19/24 with ulcers  - PPI and pepcid    HTN  - Carvedilol , followed BPs with UF  Elevated LFTs  - HIDA negative 04/19/24  - MRCP done too- negative  Dispo: Likely SNF - pending placement as of 1/2, To go to [facility] at  time of discharge   Subjective:    Seen.  Still appears upset, wonders if he will d/c tomorrow   Objective:   BP 115/60 (BP Location: Left Arm)   Pulse 68   Temp 98.3 F (36.8 C)   Resp 18   Ht 6' 1 (1.854 m)   Wt 69.2 kg   SpO2 99%   BMI 20.13 kg/m   Intake/Output Summary (Last 24 hours) at 04/28/2024  1148 Last data filed at 04/28/2024 0600 Gross per 24 hour  Intake 480 ml  Output 425 ml  Net 55 ml    Weight change:   Physical Exam: GEN: sitting in bed PULM: normal WOB, clear ant CV: Normal rate, regular rhythm JAI:fpoi distention EXT: trace edema NEURO: sleeping Dialysis access: RIJ HD catheter- tunneled  Imaging: No results found.      Labs: BMET Recent Labs  Lab 04/22/24 0436 04/23/24 0322 04/24/24 0415 04/25/24 0122 04/26/24 0409 04/27/24 0300 04/28/24 0425  NA 135 135 137 134* 136 133* 135  K 4.2 4.2 3.8 3.6 3.8 3.6 3.7  CL 97* 97* 99 97* 97* 96* 99  CO2 29 30 27 30 28 29 27   GLUCOSE 89 93 91 88 94 101* 121*  BUN 73* 42* 51* 29* 43* 26* 42*  CREATININE 3.84* 2.58* 3.33* 2.26* 3.08* 2.86* 3.60*  CALCIUM  8.4* 8.4* 8.5* 8.3* 8.2* 7.9* 8.2*   CBC Recent Labs  Lab 04/24/24 0415 04/26/24 0409 04/27/24 0300 04/28/24 0425  WBC 13.1* 12.3* 12.0* 10.7*  HGB 7.6* 7.7* 7.7* 7.8*  HCT 24.1* 24.9* 25.1* 25.1*  MCV 86.4 89.2 90.3 88.1  PLT 293 234 208 208    Medications:     apixaban   5 mg Oral BID   carvedilol   3.125 mg Oral BID WC   Chlorhexidine  Gluconate Cloth  6 each Topical Daily   clonazePAM   0.5 mg Oral BID   famotidine   20 mg Oral Daily   feeding supplement  237 mL Oral BID BM   [name] butt cream   Topical BID   guaiFENesin   10 mL Oral Q4H   insulin  aspart  0-9 Units Subcutaneous Q4H   lidocaine   1 patch Transdermal Q24H   multivitamin  1 tablet Oral QHS   pantoprazole   40 mg Oral BID   saccharomyces boulardii  250 mg Oral BID   sodium chloride  flush  10-40 mL Intracatheter Q12H   torsemide   100 mg Oral Daily

## 2024-04-28 NOTE — Progress Notes (Signed)
 " PROGRESS NOTE    Billy [name]  FMW:[id number] DOB: [birth year] DOA: 01/21/2024 PCP: Seabron Lenis, MD   Brief Narrative:  Chronically ill 73/M prolonged hospitalization from 3/10-4/3 with cardiac arrest, A-fib, DVTs, hematochezia, IVC filter, chest tubes, eventually stabilized, discharged to rehab and then went home, readmitted 10/5 with large esophageal tear.   10/7 esophageal stent placed by cardiothoracic 10/15 stent reposition 10/16 extubated 10/20 reintubated with ultrasound showing left-sided pleural effusion-left-sided chest tube placed-by lights criteria exudative effusion 10/23 echo 60-65% 10/24 barium swallow showed persistent leak 10/28 transferred out of ICU 11/3 PEG tube placed 11/19 swelling of right arm showing on ultrasound SVT cephalic vein 11/22 chest x-ray?  Pneumonia done because of back pain-follow-up CT scan shows stent in place no mediastinal fluid collection small bilateral pleural effusions with bibasilar atelectasis pneumonia not excluded-scattered clusters nodular densities predominantly right upper and right lower lobes with nodule 11 mm as previously seen?  Mets?  Infection?  Aspiration 11/26 stent removed. Esophagram negative for esophageal leak.  11/27 getting diuresis again 11/28 duodenitis/enteritis.  GI consulted. Bloody emesis added carafate   11/29 trialing clears 12/1 NV again w/ hematemesis. Lasix  held ---  esophagram performed showing patent esophagus without perforation irregularity mid esophagus?  Stent diffuse narrowing lumen distal duodenum and proximal jejunum 12/2 EGD ischemic ulcers. Impression:  - Congested, inflamed, nodular, texture changed  mucosa in the esophagus. - Gastritis, Non-bleeding duodenal ulcers with a clean ulcer  base (Forrest Class III).- Non-bleeding jejunal ulcers with a clean ulcer   base (Forrest Class III)  - Findings suggestive of ischemic ulcers. Increased carafate  and cont BID PPI  12/3 full liq diet  12/4 marked  increased WOB. Acute and progressive w/ increased hypoxia and HTN. 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 12/17 off clev transition to iHD, transfer to floor  12/18 TDC placed 12/19 [name] assumed care, has distended and painful abdomen, KUB shows improvement so tube feeds resumed, Eliquis  resumed 12/22 hemoccult neg---transfused 1 U--- Eliquis  had to be held in the setting 12/23 vascular consulted secondary to lower extremities----x-ray shows congestion no pneumonia white count up-- 12/24 ABIs showed abnormal right and left toe brachial indices-no intervention from vascular required 12/26 severe respiratory distress needing nonrebreather 12/29 EP consulted for recurrent ventricular tachycardia, recurrent A-fib RVR> Amio gtt. 12/30 started on mexiletine 12/31 LFTs trending up, amiodarone  and mexiletine discontinued,  1/1 LFTs continue to worsen, HIDA scan ordered, palliative care consulted 1/2 HIDA scan negative for cholecystitis, holding tube feeds which could be contributing to cholestasis. Restarting IV heparin  for persistent A-fib 1/3 AST ALT improving, alk phos still significantly elevated 1/5 switched IV Heparin  to Eliquis   1/11 wean off IV narcotics given disposition planning for later this week  Assessment & Plan:   Principal Problem:   Esophageal rupture Active Problems:   Pleural effusion   Ventilator dependence (HCC)   Acute respiratory failure with hypoxia (HCC)   Protein-calorie malnutrition, severe   Intractable pain   Pressure injury of skin   On mechanically  assisted ventilation (HCC)   Hypertensive urgency  Acute kidney injury, new ESRD Volume overload - Nephrology consulted appreciate insight  recommendations, completed prior CRRT 12/10-12/16, TDC placed 12/18 -has been tolerating dialysis well since that time -Urine output minimally improving over the past week - ANA, antihistone antibody positive, - Renal biopsy 12/16 noted 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, felt to be secondary to prior infection  - Completed antibiotics and steroid course - Consulted palliative care with all above ongoing issues and high risk of decompensation, wishes for full code and full scope for now  Nonspecific leukocytosis, resolved - Remains afebrile, has completed multiple rounds of antibiotics - Steroids discontinued - with abnormal LFTs and mild abdominal tenderness chronic cholecystitis was felt to be a possibility, RUQ US  q/ GB wall thickening, CBD was normal, HIDA scan finally completed  negative for acute cholecystitis   Boerhaave syndrome Recurrent upper GI bleed - 10/7 had esophageal stent placed by CT surgery - 10/15, esophageal stent repositioned - 11/26: Esophageal stent removed, no further leak -Ischemic ulcer disease--GI and cardiothoracic aware-several CTs since 11/25---CT scan done 12/21 unremarkable, no Carafate  with HD, Maalox discontinued -Last esophagram as above 12/1 -Continue PPI - No further bleeding now, Eliquis  restarted   Elevated LFT's, concern for toxicity secondary to medications versus shock liver - On CT abd from 12/21 with mild biliary ductal dilation vs mild periportal edema and tiny layering stone in GB neck - Right upper quadrant ultrasound was nonspecific, ascites and gallbladder wall thickening, CBD was normal, has mild epigastric and right sided tenderness on exam, HIDA scan negative for cholecystitis 04/19/24 - Held tube feeds in case that could be contributing  to cholestasis - Amiodarone , mexiletine, statin, acetaminophen  discontinued - Prior hypotensive event may be contributing but now normotensive - No notable obstruction on subsequent imaging  Severe Malnutrition- Body mass index is 20.13 kg/m. -S/p PEG placement and removal Tolerating p.o. well at this point   Normocytic anemia, likely acute on chronic anemia of chronic disease - Hemoglobin stabilizing, noted peptic ulcer disease versus gastritis above -Received 4 unit PRBC since admission, last transfusion 12/28   Cardiac arrest 06/2023 with HF MR EF 55-65% 03/27/2024 A-fib RVR CHADVASC >4 on Eliquis  [cardioverted 06/2023]--- recurrent RVR at times -cardiology following again for recurrent A-fib RVR -Current regimen includes carvedilol , torsemide  -prior amiodarone  and mexiletine were discontinued in the setting of elevated LFTs  DVT PE March 2025 - IVC filter in place since March 2025 - Lower limb ischemia ruled out 12.23 - ABI rules out ischemia-appreciate VVS--no intervention needed - Continue Eliquis    Insomnia - Continued on regimen of trazodone  Seroquel  and hydroxyzine  Diabetes mellitus type 2 Continue glucose monitoring/insulin    Multiple bedsores and wounds anterior right and left foot and injury stage I to bottom -Continue Gerhardt twice daily Lidoderm  patch for back pain     VT, resolved - recurrent runs of VT 12/28 - Cards was following, amiodarone  and mexiletine discontinued with rising LFTs -continue carvedilol    PEG tube Dietitian have recommended removal of patient PEG tube conceptus more than 55 weeks old and is not being used PEG tube removed on 04/27/2024   DVT prophylaxis: Place TED hose Start: 02/27/24 1903 SCDs Start: 01/21/24 2023 apixaban  (ELIQUIS ) tablet 5 mg   Code Status:   Code Status: Full Code  Family Communication: None present  Status is: Inpatient  Dispo: The patient is from: Home              Anticipated d/c is to: To be determined  Anticipated d/c date is: 48 to 72 hours              Patient currently approaching medically stable for discharge  Consultants:  PCCM, nephrology, GI, cardiology/EP, palliative care, vascular surgery  Antimicrobials:  None currently indicated  Subjective: No acute issues or events overnight, lengthy discussion in regards to pain management, discontinuation of IV narcotics was met with some resistance but patient understands transitioning to p.o. for discharge, otherwise denies nausea vomiting diarrhea constipation headache fevers chills or shortness of breath.  Objective: Vitals:   04/27/24 1648 04/27/24 2014 04/28/24 0405 04/28/24 0717  BP: 114/74 113/66 (!) 117/54 115/60  Pulse: 70 (!) 51 70 68  Resp: 18 18 18 18   Temp: 97.9 F (36.6 C) (!) 97.5 F (36.4 C) 98.2 F (36.8 C) 98.3 F (36.8 C)  TempSrc:      SpO2: 100% 98% 100% 99%  Weight:      Height:        Intake/Output Summary (Last 24 hours) at 04/28/2024 0816 Last data filed at 04/28/2024 0600 Gross per 24 hour  Intake 480 ml  Output 225 ml  Net 255 ml   Filed Weights   04/24/24 0441 04/25/24 0453  Weight: 72 kg 69.2 kg    Examination:  General:  Pleasantly resting in bed, No acute distress.  Appears chronically ill, somewhat agitated per discussion of narcotic cessation Neck: Right IJ HD catheter noted Lungs:  Clear to auscultate bilaterally without rhonchi, wheeze, or rales. Heart:  Regular rate and rhythm.  Without murmurs, rubs, or gallops. Abdomen:  Soft, nontender, nondistended.  Prior PEG tube site clean dry intact. Extremities: Without cyanosis, clubbing, edema, or obvious deformity. Skin:  Warm and dry, no erythema. Psych: Normal mood, flat affect   Data Reviewed: I have personally reviewed following labs and imaging studies  CBC: Recent Labs  Lab 04/22/24 0436 04/24/24 0415 04/26/24 0409 04/27/24 0300 04/28/24 0425  WBC 14.6* 13.1* 12.3* 12.0* 10.7*  HGB 7.8* 7.6* 7.7* 7.7* 7.8*   HCT 24.2* 24.1* 24.9* 25.1* 25.1*  MCV 83.2 86.4 89.2 90.3 88.1  PLT 326 293 234 208 208   Basic Metabolic Panel: Recent Labs  Lab 04/24/24 0415 04/25/24 0122 04/26/24 0409 04/27/24 0300 04/28/24 0425  NA 137 134* 136 133* 135  K 3.8 3.6 3.8 3.6 3.7  CL 99 97* 97* 96* 99  CO2 27 30 28 29 27   GLUCOSE 91 88 94 101* 121*  BUN 51* 29* 43* 26* 42*  CREATININE 3.33* 2.26* 3.08* 2.86* 3.60*  CALCIUM  8.5* 8.3* 8.2* 7.9* 8.2*   GFR: Estimated Creatinine Clearance: 18.4 mL/min (A) (by C-G formula based on SCr of 3.6 mg/dL (H)). Liver Function Tests: Recent Labs  Lab 04/22/24 0436 04/23/24 0322 04/24/24 0415 04/25/24 0122 04/27/24 0300  AST 103* 93* 70* 78* 99*  ALT 203* 166* 135* 128* 129*  ALKPHOS 1,695* 1,549* 1,307* 1,380* 1,477*  BILITOT 1.5* 1.2 1.2 1.1 1.1  PROT 5.4* 5.5* 5.4* 5.4* 5.2*  ALBUMIN  2.2* 2.1* 2.1* 2.2* 2.1*   CBG: Recent Labs  Lab 04/27/24 1648 04/27/24 2017 04/28/24 0007 04/28/24 0408 04/28/24 0718  GLUCAP 120* 122* 85 112* 124*    Radiology Studies: No results found.  Scheduled Meds:  apixaban   5 mg Oral BID   carvedilol   3.125 mg Oral BID WC   Chlorhexidine  Gluconate Cloth  6 each Topical Daily   clonazePAM   0.5 mg Oral BID   famotidine   20 mg Oral Daily   feeding  supplement  237 mL Oral BID BM   [name] butt cream   Topical BID   guaiFENesin   10 mL Oral Q4H   insulin  aspart  0-9 Units Subcutaneous Q4H   lidocaine   1 patch Transdermal Q24H   multivitamin  1 tablet Oral QHS   pantoprazole   40 mg Oral BID   saccharomyces boulardii  250 mg Oral BID   sodium chloride  flush  10-40 mL Intracatheter Q12H   torsemide   100 mg Oral Daily   Continuous Infusions:  promethazine  (PHENERGAN ) injection (IM or IVPB) 12.5 mg (04/27/24 1709)     LOS: 98 days   Time spent:  Elsie JAYSON Montclair, DO [facility]  If 7PM-7AM, please contact night-coverage [email or web address]  04/28/2024, 8:16 AM      "

## 2024-04-29 DIAGNOSIS — K223 Perforation of esophagus: Secondary | ICD-10-CM | POA: Diagnosis not present

## 2024-04-29 LAB — RENAL FUNCTION PANEL
Albumin: 2.2 g/dL — ABNORMAL LOW (ref 3.5–5.0)
Anion gap: 12 (ref 5–15)
BUN: 61 mg/dL — ABNORMAL HIGH (ref 8–23)
CO2: 27 mmol/L (ref 22–32)
Calcium: 8.2 mg/dL — ABNORMAL LOW (ref 8.9–10.3)
Chloride: 102 mmol/L (ref 98–111)
Creatinine, Ser: 4.09 mg/dL — ABNORMAL HIGH (ref 0.61–1.24)
GFR, Estimated: 15 mL/min — ABNORMAL LOW
Glucose, Bld: 141 mg/dL — ABNORMAL HIGH (ref 70–99)
Phosphorus: 4.9 mg/dL — ABNORMAL HIGH (ref 2.5–4.6)
Potassium: 4.1 mmol/L (ref 3.5–5.1)
Sodium: 140 mmol/L (ref 135–145)

## 2024-04-29 LAB — GLUCOSE, CAPILLARY
Glucose-Capillary: 131 mg/dL — ABNORMAL HIGH (ref 70–99)
Glucose-Capillary: 126 mg/dL — ABNORMAL HIGH (ref 70–99)
Glucose-Capillary: 100 mg/dL — ABNORMAL HIGH (ref 70–99)

## 2024-04-29 LAB — CBC
HCT: 27 % — ABNORMAL LOW (ref 39.0–52.0)
Hemoglobin: 8.1 g/dL — ABNORMAL LOW (ref 13.0–17.0)
MCH: 27.3 pg (ref 26.0–34.0)
MCHC: 30 g/dL (ref 30.0–36.0)
MCV: 90.9 fL (ref 80.0–100.0)
Platelets: 221 K/uL (ref 150–400)
RBC: 2.97 MIL/uL — ABNORMAL LOW (ref 4.22–5.81)
RDW: 21.2 % — ABNORMAL HIGH (ref 11.5–15.5)
WBC: 9 K/uL (ref 4.0–10.5)
nRBC: 0 % (ref 0.0–0.2)

## 2024-04-29 MED ORDER — HEPARIN SODIUM (PORCINE) 1000 UNIT/ML IJ SOLN
1000.0000 [IU] | Freq: Once | INTRAMUSCULAR | Status: AC
  Administered 2024-04-29: 3800 [IU] via INTRAVENOUS

## 2024-04-29 MED ORDER — HEPARIN SODIUM (PORCINE) 1000 UNIT/ML IJ SOLN
INTRAMUSCULAR | Status: AC
  Filled 2024-04-29: qty 4

## 2024-04-29 MED ORDER — CLONAZEPAM 0.5 MG PO TABS: 0.5000 mg | ORAL_TABLET | Freq: Two times a day (BID) | ORAL | 0 refills | Status: AC | PRN

## 2024-04-29 MED ORDER — TORSEMIDE 100 MG PO TABS: 100.0000 mg | ORAL_TABLET | Freq: Every day | ORAL | 0 refills | Status: AC

## 2024-04-29 MED ORDER — OXYCODONE HCL 5 MG PO TABS: 5.0000 mg | ORAL_TABLET | ORAL | 0 refills | Status: AC | PRN

## 2024-04-29 NOTE — Progress Notes (Signed)
 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  He has completed rounds of antibiotics.  Mild interstitial fibrosis/tubular atrophy.  Given concern for infection we provided steroids but avoided other IS medications due to weighing risks/benefits on pred taper now with plans to wean quickly given the main concern is infection as a cause he was on minoxidil  which caries similar risk for AI kidney phenomena as hydralazine . Stopped this (last dose 12/22)  Continue HD on MWF schedule; including today UOP about the same, not enough to suggest GFR recovery Tolerating HD in the chair CLIP [facility] set, dispo pending, ? SNF today  Petechial rash;             - 2/2 Iga. S/p steroids, improved   Acute on chronic CHF exacerbation;             - HD and diuretics as aboven daily torsemide    H/o DVT and PE:             - Anticoagulation per cardiology and primary service   Boorehave's syndrome             - s/p repair             - Has a PEG  - EGD 03/19/24 with ulcers  - PPI and pepcid    HTN  - Carvedilol , followed BPs with UF  Elevated LFTs  - HIDA negative 04/19/24  - MRCP done too- negative    Subjective:    Seen in room, no complaints For dialysis today, potentially to discharge to SNF as well   Objective:   BP (!) 126/59 (BP Location: Left Arm)   Pulse 82   Temp 98.1 F (36.7 C)   Resp 19   Ht 6' 1 (1.854 m)   Wt 69.2 kg   SpO2 100%   BMI 20.13 kg/m   Intake/Output Summary (Last 24 hours) at 04/29/2024 1212 Last data filed at 04/29/2024 0900 Gross per 24 hour  Intake 1200 ml  Output  450 ml  Net 750 ml    Weight change:   Physical Exam: GEN: sitting in bed PULM: normal WOB, clear ant CV: Normal rate, regular rhythm JAI:fpoi distention EXT: trace edema NEURO: sleeping Dialysis access: RIJ HD catheter- tunneled  Imaging: No results found.      Labs: BMET Recent Labs  Lab 04/23/24 0322 04/24/24 0415 04/25/24 0122 04/26/24 0409 04/27/24 0300 04/28/24 0425  NA 135 137 134* 136 133* 135  K 4.2 3.8 3.6 3.8 3.6 3.7  CL 97* 99 97* 97* 96* 99  CO2 30 27 30 28 29 27   GLUCOSE 93 91 88 94 101* 121*  BUN 42* 51* 29* 43* 26* 42*  CREATININE 2.58* 3.33* 2.26* 3.08* 2.86* 3.60*  CALCIUM  8.4* 8.5* 8.3* 8.2* 7.9* 8.2*   CBC Recent Labs  Lab 04/24/24 0415 04/26/24 0409 04/27/24 0300 04/28/24 0425  WBC 13.1* 12.3* 12.0* 10.7*  HGB 7.6* 7.7* 7.7* 7.8*  HCT 24.1* 24.9* 25.1* 25.1*  MCV 86.4 89.2 90.3 88.1  PLT 293 234 208 208    Medications:     apixaban   5 mg Oral BID   carvedilol   3.125 mg Oral BID WC   Chlorhexidine  Gluconate Cloth  6 each Topical Daily   clonazePAM   0.5 mg Oral BID   famotidine   20 mg Oral Daily   feeding supplement  237 mL Oral BID BM   [name] butt cream   Topical BID   guaiFENesin   10 mL Oral Q4H   insulin  aspart  0-5 Units Subcutaneous QHS   insulin  aspart  0-9 Units Subcutaneous TID WC   lidocaine   1 patch Transdermal Q24H   multivitamin  1 tablet Oral QHS   pantoprazole   40 mg Oral BID   saccharomyces boulardii  250 mg Oral BID   sodium chloride  flush  10-40 mL Intracatheter Q12H   torsemide   100 mg Oral Daily

## 2024-04-29 NOTE — Progress Notes (Addendum)
 Noted that pt may d/c to snf after HD here today. Contacted [facility] who informed if this occurs, pt is good to start with them this Wednesday, same chair time as approved, 1125 am, will need to arrive 1045 am first day. AVS updated to reflect this.   [facility] Dialysis navigator [phone]

## 2024-04-29 NOTE — TOC Progression Note (Addendum)
 Transition of Care [facility]) - Progression Note    Patient Details  Name: Billy [name] MRN: [id number] Date of Birth: 12-Oct-1950  Transition of Care [facility]) CM/SW Contact  Lendia Dais, [place] Phone Number: 04/29/2024, 8:30 AM  Clinical Narrative: CSW spoke to [facility] who stated that auth for the pt was approved. Auth reference # for SNF is [id number] and the ambulance reference # [id number]. SNF auth expires 01/16 Friday at 5PM. Ambulance auth is good for 90 days. MD & [name] [place] notified.   CSW will continue to monitor for discharge.      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, SNF Pending bed offer               Expected Discharge Plan and Services In-house Referral: Clinical Social Work Discharge Planning Services: [employer] Consult Post Acute Care Choice: Skilled Nursing Facility Living arrangements for the past 2 months: Single Family Home                                       Social Drivers of Health (SDOH) Interventions SDOH Screenings   Food Insecurity: No Food Insecurity (01/22/2024)  Housing: High Risk (01/22/2024)  Transportation Needs: No Transportation Needs (01/22/2024)  Utilities: Not At Risk (01/22/2024)  Social Connections: Socially Isolated (01/22/2024)  Tobacco Use: Low Risk (03/19/2024)    Readmission Risk Interventions    06/27/2023    1:52 PM 10/14/2022   11:29 AM  Readmission Risk Prevention Plan  Post Dischage Appt  Complete  Medication Screening  Complete  Transportation Screening Complete Complete  HRI or Home Care Consult Complete   Social Work Consult for Recovery Care Planning/Counseling Complete   Palliative Care Screening Not Applicable   Medication Review Oceanographer) Referral to Pharmacy

## 2024-04-29 NOTE — TOC Progression Note (Addendum)
 Transition of Care [facility]) - Progression Note    Patient Details  Name: Dio Giller MRN: [id number] Date of Birth: 1950/06/08  Transition [facility]) CM/SW Contact  Lendia Dais, [place] Phone Number: 04/29/2024, 12:37 PM  Clinical Narrative:  CSW spoke to [name] via phone who stated they are working on bed availability for the pt. Pt is schedule to received HD 2nd shift. CSW informed MD of the barriers to discharge.  53 - SNF confirmed that they can take the pt today after HD and need the DC summary by 3PM. MD notified.  CSW will continue to follow and monitor for updates on bed availability.    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, SNF Pending bed offer               Expected Discharge Plan and Services In-house Referral: Clinical Social Work Discharge Planning Services: CM Consult Post Acute Care Choice: Skilled Nursing Facility Living arrangements for the past 2 months: Single Family Home Expected Discharge Date: 04/29/24                                     Social Drivers of Health (SDOH) Interventions SDOH Screenings   Food Insecurity: No Food Insecurity (01/22/2024)  Housing: High Risk (01/22/2024)  Transportation Needs: No Transportation Needs (01/22/2024)  Utilities: Not At Risk (01/22/2024)  Social Connections: Socially Isolated (01/22/2024)  Tobacco Use: Low Risk (03/19/2024)    Readmission Risk Interventions    06/27/2023    1:52 PM 10/14/2022   11:29 AM  Readmission Risk Prevention Plan  Post Dischage Appt  Complete  Medication Screening  Complete  Transportation Screening Complete Complete  HRI or Home Care Consult Complete   Social Work Consult for Recovery Care Planning/Counseling Complete   Palliative Care Screening Not Applicable   Medication Review Oceanographer) Referral to Pharmacy

## 2024-04-29 NOTE — Progress Notes (Signed)
 PICC removed per protocol per MD order. Manual pressure applied for 5 mins. Vaseline gauze, gauze, and Tegaderm applied over insertion site. No bleeding or swelling noted. Instructed patient to remain in bed for thirty mins. Educated patient about S/S of infection and when to call MD; no heavy lifting or pressure on right side for 24 hours; keep dressing dry and intact for 24 hours. Pt verbalized comprehension.

## 2024-04-29 NOTE — Plan of Care (Signed)
 " Problem: Education: Goal: Knowledge of General Education information will improve Description: Including pain rating scale, medication(s)/side effects and non-pharmacologic comfort measures Outcome: Adequate for Discharge   Problem: Health Behavior/Discharge Planning: Goal: Ability to manage health-related needs will improve Outcome: Adequate for Discharge   Problem: Clinical Measurements: Goal: Will remain free from infection Outcome: Adequate for Discharge Goal: Diagnostic test results will improve Outcome: Adequate for Discharge Goal: Respiratory complications will improve Outcome: Adequate for Discharge Goal: Cardiovascular complication will be avoided Outcome: Adequate for Discharge   Problem: Activity: Goal: Risk for activity intolerance will decrease Outcome: Adequate for Discharge   Problem: Nutrition: Goal: Adequate nutrition will be maintained Outcome: Adequate for Discharge   Problem: Coping: Goal: Level of anxiety will decrease Outcome: Adequate for Discharge   Problem: Elimination: Goal: Will not experience complications related to bowel motility Outcome: Adequate for Discharge Goal: Will not experience complications related to urinary retention Outcome: Adequate for Discharge   Problem: Pain Managment: Goal: General experience of comfort will improve and/or be controlled Outcome: Adequate for Discharge   Problem: Safety: Goal: Ability to remain free from injury will improve Outcome: Adequate for Discharge   Problem: Skin Integrity: Goal: Risk for impaired skin integrity will decrease Outcome: Adequate for Discharge   Problem: Education: Goal: Ability to describe self-care measures that may prevent or decrease complications (Diabetes Survival Skills Education) will improve Outcome: Adequate for Discharge   Problem: Coping: Goal: Ability to adjust to condition or change in health will improve Outcome: Adequate for Discharge   Problem: Fluid  Volume: Goal: Ability to maintain a balanced intake and output will improve Outcome: Adequate for Discharge   Problem: Health Behavior/Discharge Planning: Goal: Ability to identify and utilize available resources and services will improve Outcome: Adequate for Discharge Goal: Ability to manage health-related needs will improve Outcome: Adequate for Discharge   Problem: Metabolic: Goal: Ability to maintain appropriate glucose levels will improve Outcome: Adequate for Discharge   Problem: Nutritional: Goal: Maintenance of adequate nutrition will improve Outcome: Adequate for Discharge Goal: Progress toward achieving an optimal weight will improve Outcome: Adequate for Discharge   Problem: Skin Integrity: Goal: Risk for impaired skin integrity will decrease Outcome: Adequate for Discharge   Problem: Tissue Perfusion: Goal: Adequacy of tissue perfusion will improve Outcome: Adequate for Discharge   Problem: Activity: Goal: Ability to tolerate increased activity will improve Outcome: Adequate for Discharge   Problem: Respiratory: Goal: Ability to maintain a clear airway and adequate ventilation will improve Outcome: Adequate for Discharge   Problem: Role Relationship: Goal: Method of communication will improve Outcome: Adequate for Discharge   Problem: Education: Goal: Ability to describe self-care measures that may prevent or decrease complications (Diabetes Survival Skills Education) will improve Outcome: Adequate for Discharge Goal: Individualized Educational Video(s) Outcome: Adequate for Discharge   Problem: Coping: Goal: Ability to adjust to condition or change in health will improve Outcome: Adequate for Discharge   Problem: Fluid Volume: Goal: Ability to maintain a balanced intake and output will improve Outcome: Adequate for Discharge   Problem: Health Behavior/Discharge Planning: Goal: Ability to identify and utilize available resources and services will  improve Outcome: Adequate for Discharge Goal: Ability to manage health-related needs will improve Outcome: Adequate for Discharge   Problem: Metabolic: Goal: Ability to maintain appropriate glucose levels will improve Outcome: Adequate for Discharge   Problem: Nutritional: Goal: Maintenance of adequate nutrition will improve Outcome: Adequate for Discharge Goal: Progress toward achieving an optimal weight will improve Outcome: Adequate for Discharge  Problem: Skin Integrity: Goal: Risk for impaired skin integrity will decrease Outcome: Adequate for Discharge   Problem: Tissue Perfusion: Goal: Adequacy of tissue perfusion will improve Outcome: Adequate for Discharge   Problem: Education: Goal: Knowledge of disease and its progression will improve Outcome: Adequate for Discharge   Problem: Health Behavior/Discharge Planning: Goal: Ability to manage health-related needs will improve Outcome: Adequate for Discharge   Problem: Clinical Measurements: Goal: Complications related to the disease process or treatment will be avoided or minimized Outcome: Adequate for Discharge Goal: Dialysis access will remain free of complications Outcome: Adequate for Discharge   Problem: Activity: Goal: Activity intolerance will improve Outcome: Adequate for Discharge   "

## 2024-04-29 NOTE — Plan of Care (Signed)
" ° °  Medical records reviewed. Goals of care remain clear for all available life-prolonging measures. Patient is to return to SNF. PEG removed yesterday.   PMT will follow peripherally for additional needs. Thank you for your referral and allowing PMT to assist in Mr. Jourdain Guay care.      Aleyna Cueva, PA-C Palliative Medicine Team  Team Phone # [phone]     NO CHARGE  "

## 2024-04-29 NOTE — TOC Transition Note (Addendum)
 Transition of Care [facility]) - Discharge Note   Patient Details  Name: Billy [name] MRN: [id number] Date of Birth: 11/29/1950  [facility] CM/SW Contact:  Lendia Dais, LCSWA Phone Number: 04/29/2024, 1:50 PM   Clinical Narrative:  Pt will discharge to [facility]. RN report to [phone]. Pt will go by PTAR once done with HD. PTAR scheduled at [facility]. PTAR can be called at [phone].  CSW informed the pt's son who was agreeable to discharge and transport.  No further TOC needs.     Final next level of care: Skilled Nursing Facility Barriers to Discharge: Barriers Resolved   Patient Goals and CMS Choice Patient states their goals for this hospitalization and ongoing recovery are:: SNF CMS Medicare.gov Compare Post Acute Care list provided to:: Patient Choice offered to / list presented to : Patient      Discharge Placement              Patient chooses bed at: [facility] Patient to be transferred to facility by: PTAR Name of family member notified: April (daughter) Patient and family notified of of transfer: 04/29/24  Discharge Plan and Services Additional resources added to the After Visit Summary for   In-house Referral: Clinical Social Work Discharge Planning Services: CM Consult Post Acute Care Choice: Skilled Nursing Facility                               Social Drivers of Health (SDOH) Interventions SDOH Screenings   Food Insecurity: No Food Insecurity (01/22/2024)  Housing: High Risk (01/22/2024)  Transportation Needs: No Transportation Needs (01/22/2024)  Utilities: Not At Risk (01/22/2024)  Social Connections: Socially Isolated (01/22/2024)  Tobacco Use: Low Risk (03/19/2024)     Readmission Risk Interventions    06/27/2023    1:52 PM 10/14/2022   11:29 AM  Readmission Risk Prevention Plan  Post Dischage Appt  Complete  Medication Screening  Complete  Transportation Screening Complete Complete  HRI or Home Care  Consult Complete   Social Work Consult for Recovery Care Planning/Counseling Complete   Palliative Care Screening Not Applicable   Medication Review Oceanographer) Referral to Pharmacy

## 2024-04-29 NOTE — Progress Notes (Signed)
 PT Cancellation Note  Patient Details Name: Billy [name] MRN: [id number] DOB: 27-Feb-1951   Cancelled Treatment:    Reason Eval/Treat Not Completed: Patient at procedure or test/unavailable (Pt in HD. Will return at a later date.)   Stephane JULIANNA Bevel 04/29/2024, 2:27 PM Riki Berninger M,PT Acute Rehab Services [phone]

## 2024-04-29 NOTE — Discharge Summary (Signed)
 Physician Discharge Summary  Billy [name] FMW:[id number] DOB: June 01, 1950 DOA: 01/21/2024  PCP: Seabron Lenis, MD  Admit date: 01/21/2024 Discharge date: 04/29/2024  Admitted From: Home Disposition: Facility  Recommendations for Outpatient Follow-up:  Follow up with PCP in 1-2 weeks Continue dialysis as scheduled Follow-up with nephrology as scheduled Follow-up with surgery as scheduled Follow-up with GI as scheduled  Discharge Condition: Stable CODE STATUS: Full Diet recommendation: Low-salt low-fat diet  Brief/Interim Summary: Chronically ill 73/M prolonged hospitalization from 3/10-4/3 with cardiac arrest, A-fib, DVTs, hematochezia, IVC filter, chest tubes, eventually stabilized, discharged to rehab and then went home, readmitted 10/5 with large esophageal tear.     10/7 esophageal stent placed by cardiothoracic 10/15 stent reposition 10/16 extubated 10/20 reintubated with ultrasound showing left-sided pleural effusion-left-sided chest tube placed-by lights criteria exudative effusion 10/23 echo 60-65% 10/24 barium swallow showed persistent leak 10/28 transferred out of ICU 11/3 PEG tube placed 11/19 swelling of right arm showing on ultrasound SVT cephalic vein 11/22 chest x-ray?  Pneumonia done because of back pain-follow-up CT scan shows stent in place no mediastinal fluid collection small bilateral pleural effusions with bibasilar atelectasis pneumonia not excluded-scattered clusters nodular densities predominantly right upper and right lower lobes with nodule 11 mm as previously seen?  Mets?  Infection?  Aspiration 11/26 stent removed. Esophagram negative for esophageal leak.  11/27 getting diuresis again 11/28 duodenitis/enteritis.  GI consulted. Bloody emesis added carafate   11/29 trialing clears 12/1 NV again w/ hematemesis. Lasix  held ---  esophagram performed showing patent esophagus without perforation irregularity mid esophagus?  Stent diffuse narrowing lumen distal  duodenum and proximal jejunum 12/2 EGD ischemic ulcers. Impression:  - Congested, inflamed, nodular, texture changed  mucosa in the esophagus. - Gastritis, Non-bleeding duodenal ulcers with a clean ulcer  base (Forrest Class III).- Non-bleeding jejunal ulcers with a clean ulcer   base (Forrest Class III)  - Findings suggestive of ischemic ulcers. Increased carafate  and cont BID PPI  12/3 full liq diet  12/4 marked increased WOB. Acute and progressive w/ increased hypoxia and HTN. 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 12/17 off clev transition to iHD, transfer to floor  12/18 TDC placed 12/19 [name] assumed care, has distended and painful abdomen, KUB shows improvement so tube feeds resumed, Eliquis  resumed 12/22 hemoccult neg---transfused 1 U--- Eliquis  had to be held in the setting 12/23 vascular consulted secondary to lower extremities----x-ray shows congestion no pneumonia white count up-- 12/24 ABIs showed abnormal right and left toe brachial indices-no intervention from vascular required 12/26 severe respiratory distress needing nonrebreather 12/29 EP consulted for recurrent ventricular tachycardia, recurrent A-fib RVR> Amio gtt. 12/30 started on mexiletine 12/31 LFTs trending up, amiodarone  and mexiletine discontinued,  1/1 LFTs continue to worsen, HIDA scan ordered, palliative care consulted 1/2 HIDA scan negative for cholecystitis, holding tube feeds which could be contributing to cholestasis. Restarting IV heparin  for persistent A-fib 1/3 AST ALT improving, alk phos still  significantly elevated 1/5 switched IV Heparin  to Eliquis   1/11 wean off IV narcotics given disposition planning for later this week 1/12 -patient insurance authorization approved, SNF bed available, otherwise stable for discharge.  Follow-up with  primary team, nephrology, cardiothoracic surgery and GI as scheduled. Continue home medications as discussed, and as outlined below.  At this time patient otherwise stable and agreeable for discharge to facility    Discharge Diagnoses:  Principal Problem:   Esophageal rupture Active Problems:   Pleural effusion   Ventilator dependence (HCC)   Acute respiratory failure with hypoxia (HCC)   Protein-calorie malnutrition, severe   Intractable pain   Pressure injury of skin   On mechanically assisted ventilation (HCC)   Hypertensive urgency  Acute kidney injury, new ESRD, stable Volume overload, resolved - Nephrology consulted appreciate insight recommendations, completed prior CRRT 12/10-12/16, TDC placed 12/18 -has been tolerating dialysis well since that time -Urine output minimally improving over the past week - ANA, antihistone antibody positive, - Renal biopsy 12/16 noted 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, felt to be secondary to prior infection  - Completed antibiotics and steroid course - Consulted palliative care with all above ongoing issues and high risk of decompensation, wishes for full code and full scope for now   Nonspecific leukocytosis, resolved - Remains afebrile, has completed multiple rounds of antibiotics - Steroids discontinued - with abnormal LFTs and mild abdominal tenderness chronic cholecystitis was felt to be a possibility, RUQ US  q/ GB wall thickening, CBD was normal, HIDA scan finally completed  negative for acute cholecystitis   Boerhaave syndrome Recurrent upper GI bleed, stable - 10/7 had esophageal stent placed by CT surgery - 10/15, esophageal  stent repositioned - 11/26: Esophageal stent removed, no further leak -Ischemic ulcer disease--GI and cardiothoracic aware-several CTs since 11/25---CT scan done 12/21 unremarkable, no Carafate  with HD, Maalox discontinued -Last esophagram as above 12/1 -Continue PPI - No further bleeding now, Eliquis  restarted   Elevated LFT's, concern for toxicity secondary to medications versus shock liver, resolving - On CT abd from 12/21 with mild biliary ductal dilation vs mild periportal edema and tiny layering stone in GB neck - Right upper quadrant ultrasound was nonspecific, ascites and gallbladder wall thickening, CBD was normal, has mild epigastric and right sided tenderness on exam, HIDA scan negative for cholecystitis 04/19/24 - Held tube feeds in case that could be contributing to cholestasis - Amiodarone , mexiletine, statin, acetaminophen  discontinued - Prior hypotensive event may be contributing but now normotensive - No notable obstruction on subsequent imaging   Severe Malnutrition-improving Body mass index is 20.13 kg/m. -S/p PEG placement and subsequent removal Tolerating p.o. well at this point   Normocytic anemia, likely acute on chronic anemia of chronic disease - Hemoglobin stabilizing, noted peptic ulcer disease versus gastritis above -Received 4 unit PRBC since admission, last transfusion 12/28   Cardiac arrest 06/2023 with HF MR EF 55-65% 03/27/2024 A-fib RVR CHADVASC >4 on Eliquis  [cardioverted 06/2023]--- recurrent RVR at times -cardiology following again for recurrent A-fib RVR -Current regimen includes carvedilol , torsemide  -prior amiodarone  and mexiletine were discontinued in the setting of elevated LFTs   DVT PE March 2025 - IVC filter in place since March 2025 - Lower limb ischemia ruled out 12.23 - ABI rules out ischemia-appreciate VVS--no intervention needed - Continue Eliquis    Insomnia - Continued on regimen of trazodone  Seroquel  and hydroxyzine  Diabetes  mellitus type 2 Continue glucose monitoring/insulin    Multiple bedsores and wounds anterior right and left foot and injury stage I to bottom -Continue Gerhardt twice daily Lidoderm  patch for back pain   VT, resolved - recurrent runs of VT 12/28 - Cards was following, amiodarone  and mexiletine discontinued with rising  LFTs -continue carvedilol    PEG tube Dietitian have recommended removal of patient PEG tube conceptus more than 50 weeks old and is not being used PEG tube removed on 04/27/2024  Discharge Instructions  Discharge Instructions     Call MD for:  difficulty breathing, headache or visual disturbances   Complete by: As directed    Call MD for:  extreme fatigue   Complete by: As directed    Call MD for:  hives   Complete by: As directed    Call MD for:  persistant dizziness or light-headedness   Complete by: As directed    Call MD for:  persistant nausea and vomiting   Complete by: As directed    Call MD for:  severe uncontrolled pain   Complete by: As directed    Call MD for:  temperature >100.4   Complete by: As directed    Discharge wound care:   Complete by: As directed    Paint left great toe wound with betadine daily, allow to air dry   Increase activity slowly   Complete by: As directed       Allergies as of 04/29/2024       Reactions   Hydralazine  Hcl    Concern for drug induced lupus   Norvasc  [amlodipine ] Swelling, Other (See Comments)   Excessive BLE swelling Skin discoloration, blotching   Jardiance  [empagliflozin ] Itching, Other (See Comments)   Patient mentioned penile swelling, pain   Morphine  And Codeine Itching   Opioid-induced pruritus        Medication List     STOP taking these medications    ezetimibe  10 MG tablet Commonly known as: Zetia    ibuprofen 200 MG tablet Commonly known as: ADVIL   sacubitril -valsartan  97-103 MG Commonly known as: ENTRESTO        TAKE these medications    atorvastatin  80 MG tablet Commonly  known as: LIPITOR  Take 1 tablet (80 mg total) by mouth daily. What changed: when to take this   carvedilol  3.125 MG tablet Commonly known as: COREG  Take 1 tablet (3.125 mg total) by mouth 2 (two) times daily with a meal.   clonazePAM  0.5 MG tablet Commonly known as: KLONOPIN  Place 1 tablet (0.5 mg total) into feeding tube 2 (two) times daily as needed for anxiety.   Eliquis  5 MG Tabs tablet Generic drug: apixaban  TAKE 1 TABLET BY MOUTH TWICE A DAY   Mens 50+ Multivitamin Tabs Take 1 tablet by mouth daily.   oxyCODONE  5 MG immediate release tablet Commonly known as: Oxy IR/ROXICODONE  Take 1 tablet (5 mg total) by mouth every 4 (four) hours as needed for up to 3 days for moderate pain (pain score 4-6).   pantoprazole  40 MG tablet Commonly known as: PROTONIX  Take 1 tablet (40 mg total) by mouth 2 (two) times daily.   polyethylene glycol 17 g packet Commonly known as: MIRALAX  / GLYCOLAX  Take 17 g by mouth daily as needed for mild constipation.   predniSONE  10 MG tablet Commonly known as: DELTASONE  Take 1 tablet (10 mg total) by mouth daily with breakfast for 7 days.   saccharomyces boulardii 250 MG capsule Commonly known as: FLORASTOR Take 1 capsule (250 mg total) by mouth 2 (two) times daily.   torsemide  100 MG tablet Commonly known as: DEMADEX  Take 1 tablet (100 mg total) by mouth daily. What changed:  medication strength how much to take               Discharge Care Instructions  (  From admission, onward)           Start     Ordered   04/29/24 0000  Discharge wound care:       Comments: Paint left great toe wound with betadine daily, allow to air dry   04/29/24 0835            Contact information for follow-up providers     Shyrl Linnie KIDD, MD Follow up.   Specialty: Cardiothoracic Surgery Why: Please see discharge paperwork for details of follow-up appointment with Dr. Shyrl. Contact information: [address], [place]  [place] [address] [phone]         [facility] [address] [place], [place] (DME) [facility]ba [facility] Follow up.   Specialty: DME Services Why: Nancylee will call you to schedule first home visit. Contact information: [address] [place]  [phone]        [facility], [facility] Follow up.   Specialty: Home Health Services Why: Someone will call you to schedule first home visit. Contact information: [address] [place] [address] [phone]         [facility] Follow up on 03/20/2024.   Specialty: Cardiology Why: [facility] 10:30 AM Contact information: [address] [place]  [phone]        Center, [facility]. Go on 05/01/2024.   Why: Please arrive 10:45am for first day.   after first day, you will go here for dialysis every monday, wednesday, and friday at 11:25am. Contact information: [address] [place] [address] [phone]              Contact information for after-discharge care     Destination     [facility] [name], [place] .   Service: Skilled Nursing Contact information: [address] [place]  [address] [phone]                    Allergies[1]  Consultations: Cardiothoracic surgery, GI, PCCM, nephrology  Procedures/Studies: MR ABDOMEN MRCP W WO CONTRAST Result Date: 04/24/2024 CLINICAL DATA:  Abnormal LFTs. EXAM: MRI ABDOMEN WITHOUT AND WITH CONTRAST (INCLUDING MRCP) TECHNIQUE: Multiplanar multisequence MR imaging of the abdomen was performed both before and after the administration of intravenous contrast. Heavily T2-weighted images of the biliary and pancreatic ducts were obtained, and three-dimensional MRCP images were rendered by post processing. CONTRAST:  7mL GADAVIST  GADOBUTROL  1 MMOL/ML IV SOLN  COMPARISON:  Abdominal ultrasound 04/16/2024. FINDINGS: Lower chest: Small bilateral pleural effusions. Hepatobiliary: No focal abnormality within the liver. Gallbladder is distended with tiny 1-2 mm layering stones (axial 12/4). No intra or extrahepatic biliary duct dilatation. Extrahepatic common bile duct measures 6 mm diameter, upper normal. No evidence for choledocholithiasis. Pancreas: No focal mass lesion. No dilatation of the main duct. No intraparenchymal cyst. No peripancreatic edema. Spleen:  No splenomegaly. No suspicious focal mass lesion. Adrenals/Urinary Tract: No adrenal nodule or mass. Left kidney markedly atrophic. Right kidney unremarkable. Stomach/Bowel: Gastrostomy tube evident. Stomach is nondistended. Duodenum is normally positioned as is the ligament of Treitz. No small bowel or colonic dilatation within the visualized abdomen. Vascular/Lymphatic: No abdominal aortic aneurysm. Linear band of hypointense signal identified in the lumen of the distal abdominal aorta compatible with the densely calcified finding on CT of 04/07/2024 potentially calcified mural thrombus or chronic focal dissection. No abdominal lymphadenopathy Other:  No substantial intraperitoneal free fluid. Musculoskeletal:  No suspicious marrow enhancement. IMPRESSION: 1. Cholelithiasis with tiny 1-2 mm layering stones in the gallbladder lumen. No intra or extrahepatic biliary duct dilatation. No evidence for choledocholithiasis. 2. Small bilateral pleural effusions. 3. Markedly atrophic left kidney. Electronically Signed   By: Camellia Candle M.D.   On: 04/24/2024 05:24   MR 3D Recon At Scanner Result Date: 04/24/2024 CLINICAL DATA:  Abnormal LFTs. EXAM: MRI ABDOMEN WITHOUT AND WITH CONTRAST (INCLUDING MRCP) TECHNIQUE: Multiplanar multisequence MR imaging of the abdomen was performed both before and after the administration of intravenous contrast. Heavily T2-weighted images of the biliary and pancreatic ducts were obtained,  and three-dimensional MRCP images were rendered by post processing. CONTRAST:  7mL GADAVIST  GADOBUTROL  1 MMOL/ML IV SOLN COMPARISON:  Abdominal ultrasound 04/16/2024. FINDINGS: Lower chest: Small bilateral pleural effusions. Hepatobiliary: No focal abnormality within the liver. Gallbladder is distended with tiny 1-2 mm layering stones (axial 12/4). No intra or extrahepatic biliary duct dilatation. Extrahepatic common bile duct measures 6 mm diameter, upper normal. No evidence for choledocholithiasis. Pancreas: No focal mass lesion. No dilatation of the main duct. No intraparenchymal cyst. No peripancreatic edema. Spleen:  No splenomegaly. No suspicious focal mass lesion. Adrenals/Urinary Tract: No adrenal nodule or mass. Left kidney markedly atrophic. Right kidney unremarkable. Stomach/Bowel: Gastrostomy tube evident. Stomach is nondistended. Duodenum is normally positioned as is the ligament of Treitz. No small bowel or colonic dilatation within the visualized abdomen. Vascular/Lymphatic: No abdominal aortic aneurysm. Linear band of hypointense signal identified in the lumen of the distal abdominal aorta compatible with the densely calcified finding on CT of 04/07/2024 potentially calcified mural thrombus or chronic focal dissection. No abdominal lymphadenopathy Other:  No substantial intraperitoneal free fluid. Musculoskeletal: No suspicious marrow enhancement. IMPRESSION: 1. Cholelithiasis with tiny 1-2 mm layering stones in the gallbladder lumen. No intra or extrahepatic biliary duct dilatation. No evidence for choledocholithiasis. 2. Small bilateral pleural effusions. 3. Markedly atrophic left kidney. Electronically Signed   By: Camellia Candle M.D.   On: 04/24/2024 05:24   NM Hepatobiliary Liver Func Result Date: 04/19/2024 EXAM: NM HEPATOBILLARY SCAN 04/19/2024 11:18:01 AM TECHNIQUE: RADIOPHARMACEUTICAL: 5.2 mCi Tc-19m mebrofenin  Dynamic images of the abdomen and pelvis were obtained in the anterior  projection for 1 hour after intravenous administration of radiopharmaceutical. COMPARISON: Abdominal sonogram dated 04/16/2024. CLINICAL HISTORY: Cholecystitis. FINDINGS: Homogenous uptake within the liver. Normal clearance of the blood pool. Appropriate excretion into the biliary system. Activity is visualized in the gallbladder. IMPRESSION: 1. No scintigraphic evidence of acute cholecystitis. Electronically signed by: Evalene Coho MD 04/19/2024 12:28 PM EST RP Workstation: [id number]   US  Abdomen Limited RUQ (LIVER/GB) Result Date: 04/17/2024 CLINICAL DATA:  Elevated LFTs. EXAM: ULTRASOUND ABDOMEN LIMITED RIGHT UPPER QUADRANT COMPARISON:  CT 04/07/2024 FINDINGS: Gallbladder: Physiologically distended. No definite gallstones seen on the current exam. Echogenicity in the gallbladder neck which is difficult to accurately assess on the current exam, suspect sludge. Mild gallbladder wall thickening at 3.8 mm. No sonographic Murphy sign noted by sonographer. Common bile duct: Diameter: 4-5 mm, normal. Liver: No focal lesion identified. Mildly heterogeneous in parenchymal echogenicity. Portal vein is patent on color Doppler imaging with normal direction of blood flow towards the liver. Other: Trace right upper quadrant ascites. Right pleural effusion noted. Patient had difficulty with positioning. IMPRESSION: 1. Nonspecific gallbladder wall thickening in the setting of ascites. No specific findings of acute cholecystitis. 2. Suspect layering sludge in the gallbladder neck. 3. Trace right upper quadrant ascites. Electronically Signed   By: Andrea Marlee HERO.D.  On: 04/17/2024 10:00   ECHOCARDIOGRAM LIMITED Result Date: 04/15/2024    ECHOCARDIOGRAM LIMITED REPORT   Patient Name:   Billy [name] Date of Exam: 04/15/2024 Medical Rec #:  [id number]  Height:       73.0 in Accession #:    [id number] Weight:       167.5 lb Date of Birth:  March 01, 1951  BSA:          1.996 m Patient Age:    73 years   BP:           161/70  mmHg Patient Gender: M          HR:           83 bpm. Exam Location:  Inpatient Procedure: Limited Echo Indications:    Ventricular Tachycardia I47.2  History:        Patient has prior history of Echocardiogram examinations, most                 recent 03/27/2024. Arrythmias:Atrial Fibrillation; Risk                 Factors:Hypertension.  Sonographer:    Jayson Gaskins Referring Phys: [id number] RENEE LYNN URSUY IMPRESSIONS  1. Left ventricular ejection fraction, by estimation, is 60 to 65%. The left ventricle has normal function. There is mild concentric left ventricular hypertrophy. Left ventricular diastolic function could not be evaluated.  2. Right ventricular systolic function is normal. The right ventricular size is normal.  3. The mitral valve is normal in structure. No evidence of mitral valve regurgitation.  4. The aortic valve is tricuspid. Aortic valve regurgitation is not visualized. Aortic valve sclerosis/calcification is present, without any evidence of aortic stenosis.  5. The inferior vena cava is normal in size with greater than 50% respiratory variability, suggesting right atrial pressure of 3 mmHg. FINDINGS  Left Ventricle: Left ventricular ejection fraction, by estimation, is 60 to 65%. The left ventricle has normal function. There is mild concentric left ventricular hypertrophy. Left ventricular diastolic function could not be evaluated. Left ventricular diastolic function could not be evaluated due to nondiagnostic images. Right Ventricle: The right ventricular size is normal. Right ventricular systolic function is normal. Pericardium: There is no evidence of pericardial effusion. Mitral Valve: The mitral valve is normal in structure. Aortic Valve: The aortic valve is tricuspid. Aortic valve regurgitation is not visualized. Aortic valve sclerosis/calcification is present, without any evidence of aortic stenosis. Aorta: The aortic root is normal in size and structure. Venous: The inferior vena cava  is normal in size with greater than 50% respiratory variability, suggesting right atrial pressure of 3 mmHg. LEFT VENTRICLE PLAX 2D LVIDd:         4.60 cm LVIDs:         3.30 cm LV PW:         1.20 cm LV IVS:        1.20 cm LVOT diam:     2.10 cm LVOT Area:     3.46 cm   SHUNTS Systemic Diam: 2.10 cm Morene Brownie Electronically signed by Morene Brownie Signature Date/Time: 04/15/2024/5:56:35 PM    Final    DG CHEST PORT 1 VIEW Result Date: 04/15/2024 EXAM: 1 VIEW(S) XRAY OF THE CHEST 04/15/2024 07:56:00 AM COMPARISON: 04/14/2024. CLINICAL HISTORY: Acute respiratory distress. FINDINGS: LINES, TUBES AND DEVICES: Right arm PICC line tip is in the projection of the SVC. There is also a right IJ dialysis catheter with tip at the superior cavoatrial junction. Loop recorder  is noted in the projection of the left heart border. LUNGS AND PLEURA: Unchanged small bilateral pleural effusions. Persistent bilateral interstitial and airspace opacities. No pneumothorax. HEART AND MEDIASTINUM: No acute abnormality of the cardiac and mediastinal silhouettes. BONES AND SOFT TISSUES: No acute osseous abnormality. IMPRESSION: 1. Persistent bilateral interstitial and airspace opacities. 2. Unchanged small bilateral pleural effusions. Electronically signed by: Waddell Calk MD 04/15/2024 08:07 AM EST RP Workstation: [id number]   DG Chest Port 1 View Result Date: 04/14/2024 CLINICAL DATA:  Pulmonary edema. EXAM: PORTABLE CHEST 1 VIEW COMPARISON:  Radiograph yesterday FINDINGS: Stable positioning of right-sided dialysis catheter and right upper extremity PICC. Slight improvement in heterogeneous interstitial opacities. Cardiomegaly is stable. Small pleural effusions are unchanged. Left chest wall loop recorder. No pneumothorax. IMPRESSION: 1. Slight improvement in heterogeneous interstitial opacities, likely improving pulmonary edema. 2. Stable small pleural effusions. Electronically Signed   By: Andrea Gasman M.D.   On:  04/14/2024 16:42   DG CHEST PORT 1 VIEW Result Date: 04/13/2024 CLINICAL DATA:  Pneumonia. EXAM: PORTABLE CHEST 1 VIEW COMPARISON:  04/12/2024 FINDINGS: Patchy bilateral airspace disease is similar to prior. Tiny effusions again noted. The cardio pericardial silhouette is enlarged. Right IJ central line tip overlies the distal SVC level. Right PICC line tip is stable over the mid to distal SVC. IMPRESSION: No substantial interval change. Patchy bilateral airspace disease with tiny effusions. Electronically Signed   By: Camellia Candle M.D.   On: 04/13/2024 09:13   DG CHEST PORT 1 VIEW Result Date: 04/13/2024 CLINICAL DATA:  Pneumonia. EXAM: PORTABLE CHEST 1 VIEW COMPARISON:  04/12/2024 FINDINGS: Similar patchy bilateral airspace disease scattered relatively diffusely in both lungs. Tiny pleural effusions again noted. Right IJ central line tip overlies the low SVC level. Right PICC line tip projects at the mid SVC level. The cardio pericardial silhouette is mildly enlarged. Telemetry leads overlie the chest. IMPRESSION: 1. No substantial interval change in patchy bilateral airspace disease. 2. Tiny bilateral pleural effusions. Electronically Signed   By: Camellia Candle M.D.   On: 04/13/2024 05:56   DG Chest Port 1 View Result Date: 04/12/2024 EXAM: 1 VIEW(S) XRAY OF THE CHEST 04/12/2024 06:45:00 AM COMPARISON: 04/09/2024 CLINICAL HISTORY: 74 year old male with acute respiratory distress. FINDINGS: LINES, TUBES AND DEVICES: Left chest cardiac loop recorder noted. Stable right IJ approach dual lumen catheter in place. Stable right PICC line in place. LUNGS AND PLEURA: Worsening patchy and confluent bilateral pulmonary opacity, most confluent in the right upper lobe. Small bilateral pleural effusions. No pneumothorax. HEART AND MEDIASTINUM: Left chest cardiac loop recorder noted. No acute abnormality of the cardiac and mediastinal silhouettes. BONES AND SOFT TISSUES: Paucity of bowel gas. No acute osseous  abnormality. IMPRESSION: 1. Worsening bilateral patchy confluent opacities, greatest RUL. Small bilateral pleural effusions. Consider progressive pulmonary edema or bilateral infection. Electronically signed by: Helayne Hurst MD 04/12/2024 06:57 AM EST RP Workstation: [id number]   VAS US  ABI WITH/WO TBI Result Date: 04/10/2024  LOWER EXTREMITY DOPPLER STUDY Patient Name:  Billy [name]  Date of Exam:   04/10/2024 Medical Rec #: [id number]   Accession #:    [id number] Date of Birth: March 04, 1951   Patient Gender: M Patient Age:   35 years Exam Location:  [facility] Procedure:      VAS US  ABI WITH/WO TBI Referring Phys: NORMAN SERVE --------------------------------------------------------------------------------  Indications: Peripheral artery disease. High Risk Factors: Hypertension, hyperlipidemia.  Comparison Study: No prior studies. Performing Technologist: Gerome Ny RVT  Examination Guidelines: A complete evaluation  includes at minimum, Doppler waveform signals and systolic blood pressure reading at the level of bilateral brachial, anterior tibial, and posterior tibial arteries, when vessel segments are accessible. Bilateral testing is considered an integral part of a complete examination. Photoelectric Plethysmograph (PPG) waveforms and toe systolic pressure readings are included as required and additional duplex testing as needed. Limited examinations for reoccurring indications may be performed as noted.  ABI Findings: +---------+------------------+-----+-----------+----------+ Right    Rt Pressure (mmHg)IndexWaveform   Comment    +---------+------------------+-----+-----------+----------+ Brachial                                   Restricted +---------+------------------+-----+-----------+----------+ PTA      111               1.01 multiphasic           +---------+------------------+-----+-----------+----------+ DP       112               1.02 multiphasic            +---------+------------------+-----+-----------+----------+ Great Toe61                0.55                       +---------+------------------+-----+-----------+----------+ +---------+------------------+-----+-----------+-------+ Left     Lt Pressure (mmHg)IndexWaveform   Comment +---------+------------------+-----+-----------+-------+ Brachial 110                    triphasic          +---------+------------------+-----+-----------+-------+ PTA      109               0.99 multiphasic        +---------+------------------+-----+-----------+-------+ DP       104               0.95 multiphasic        +---------+------------------+-----+-----------+-------+ Great Toe73                0.66                    +---------+------------------+-----+-----------+-------+ +-------+-----------+-----------+------------+------------+ ABI/TBIToday's ABIToday's TBIPrevious ABIPrevious TBI +-------+-----------+-----------+------------+------------+ Right  1.02       0.55                                +-------+-----------+-----------+------------+------------+ Left   0.99       0.66                                +-------+-----------+-----------+------------+------------+  Summary: Right: Resting right ankle-brachial index is within normal range. The right toe-brachial index is abnormal.  Left: Resting left ankle-brachial index is within normal range. The left toe-brachial index is abnormal.  *See table(s) above for measurements and observations.  Electronically signed by Debby Robertson on 04/10/2024 at 7:24:19 PM.    Final    DG CHEST PORT 1 VIEW Result Date: 04/09/2024 CLINICAL DATA:  Pneumonia. EXAM: PORTABLE CHEST 1 VIEW COMPARISON:  03/30/2024 FINDINGS: Right jugular central venous catheter is seen with tip at the superior cavoatrial junction. No pneumothorax visualized. Stable mild cardiomegaly. Small bilateral pleural effusions again seen, with decreased size on the  right. The scarring again seen in the lateral right lung base. Mixed diffuse interstitial infiltrates and  patchy airspace opacities show no significant change, and may be due to edema or infectious/inflammatory etiologies. No new or worsening areas of pulmonary opacity are seen. IMPRESSION: No significant change in mixed diffuse interstitial infiltrates and patchy airspace opacities. Small bilateral pleural effusions, with decreased size on the right. Electronically Signed   By: Norleen DELENA Kil M.D.   On: 04/09/2024 09:30   CT ABDOMEN PELVIS W CONTRAST Result Date: 04/07/2024 CLINICAL DATA:  Peritonitis or perforation suspected. EXAM: CT ABDOMEN AND PELVIS WITH CONTRAST TECHNIQUE: Multidetector CT imaging of the abdomen and pelvis was performed using the standard protocol following bolus administration of intravenous contrast. RADIATION DOSE REDUCTION: This exam was performed according to the departmental dose-optimization program which includes automated exposure control, adjustment of the mA and/or kV according to patient size and/or use of iterative reconstruction technique. CONTRAST:  75mL OMNIPAQUE  IOHEXOL  350 MG/ML SOLN COMPARISON:  CT abdomen pelvis dated 03/24/2024. FINDINGS: Lower chest: Partially visualized small bilateral pleural effusions with partial compressive atelectasis of the lower lobes. Mild cardiomegaly. No intra-abdominal free air.  Small ascites. Hepatobiliary: The liver is unremarkable. There is mild biliary ductal dilatation versus mild periportal edema. Tiny layering stone noted in the gallbladder neck. No pericholecystic fluid or evidence of acute cholecystitis by CT. Pancreas: Unremarkable. No pancreatic ductal dilatation or surrounding inflammatory changes. Spleen: Normal in size without focal abnormality. Adrenals/Urinary Tract: The adrenal glands unremarkable. The left kidney is atrophic. The right kidney is unremarkable. The visualized ureters appear unremarkable. The urinary bladder  is decompressed around a Foley catheter. 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 There is moderate stool throughout the colon. There is no bowel obstruction or active inflammation. The appendix is normal. Vascular/Lymphatic: Moderate aortoiliac atherosclerotic disease. The IVC is unremarkable. An infrarenal IVC filter is noted. No portal venous gas. The SMV, splenic vein, and main portal vein are patent. There is no adenopathy. Reproductive: The prostate gland is grossly unremarkable. Other: There is diffuse subcutaneous edema and anasarca. Musculoskeletal: No acute osseous pathology. Degenerative changes of the spine. IMPRESSION: 1. No bowel obstruction. Normal appendix. 2. Small ascites and anasarca. 3. Partially visualized small bilateral pleural effusions with partial compressive atelectasis of the lower lobes. 4. Cholelithiasis. 5.  Aortic Atherosclerosis (ICD10-I70.0). Electronically Signed   By: Vanetta Chou M.D.   On: 04/07/2024 16:14   DG Abd 1 View Result Date: 04/05/2024 CLINICAL DATA:  Abdominal distension.  Pain, nausea. EXAM: ABDOMEN - 1 VIEW COMPARISON:  Radiograph 04/02/2024 FINDINGS: Gastrostomy tube projects over the left upper quadrant. IVC filter in place. Persistent but improving gaseous bowel distension, remaining gaseous distention is primarily colon. No evidence of free air. Bilateral pleural effusions and bibasilar airspace disease, similar to prior. IMPRESSION: 1. Persistent but improving gaseous bowel distension, remaining gaseous distention is primarily colon. 2. Bilateral pleural effusions and bibasilar airspace disease, similar to prior. Electronically Signed   By: Andrea Gasman M.D.   On: 04/05/2024 21:12   IR TUNNELED CENTRAL VENOUS CATH [facility] W IMG Result Date: 04/04/2024 INDICATION: 74 year old with acute on chronic kidney disease. EXAM: FLUOROSCOPIC AND ULTRASOUND GUIDED PLACEMENT OF A TUNNELED DIALYSIS CATHETER Physician: [name] SAUNDERS. Philip, MD MEDICATIONS: Ancef  2 g; The antibiotic was administered within an appropriate time interval prior to skin puncture. ANESTHESIA/SEDATION: Moderate (conscious) sedation was employed during this procedure. A total of Versed  1 mg and fentanyl  75 mcg was administered intravenously at the order of the provider performing the procedure. Total intra-service moderate sedation time: 23 minutes. Patient's level  of consciousness and vital signs were monitored continuously by radiology nurse throughout the procedure under the supervision of the provider performing the procedure. FLUOROSCOPY TIME:  Radiation Exposure Index (as provided by the fluoroscopic device): 9 mGy Kerma COMPLICATIONS: None immediate. PROCEDURE: Informed consent was obtained for placement of a tunneled dialysis catheter. The patient was placed supine on the interventional table. Ultrasound confirmed a patent right internal jugular vein. Ultrasound image obtained for documentation. The right neck and chest was prepped and draped in a sterile fashion. Maximal barrier sterile technique was utilized including caps, mask, sterile gowns, sterile gloves, sterile drape, hand hygiene and skin antiseptic. The right neck was anesthetized with 1% lidocaine . A small incision was made with #11 blade scalpel. A 21 gauge needle directed into the right internal jugular vein with ultrasound guidance. A micropuncture dilator set was placed. A 23 cm tip to cuff Palindrome catheter was selected. The skin below the right clavicle was anesthetized and a small incision was made with an #11 blade scalpel. A subcutaneous tunnel was formed to the vein dermatotomy site. The catheter was brought through the tunnel. The vein dermatotomy site was dilated to accommodate a peel-away sheath over a wire. The catheter was placed through the peel-away sheath and directed into the central venous structures. The tip of the catheter was placed at superior cavoatrial junction with  fluoroscopy. Fluoroscopic images were obtained for documentation. Both lumens were found to aspirate and flush well. The proper amount of heparin  was flushed in both lumens. The vein dermatotomy site was closed using a single layer of absorbable suture and Dermabond. Gel-Foam was placed in the subcutaneous tract. The catheter was secured to the skin using Prolene suture. IMPRESSION: Successful placement of a right jugular tunneled dialysis catheter using ultrasound and fluoroscopic guidance. Electronically Signed   By: [name] Balder M.D.   On: 04/04/2024 17:01   US  RENAL Result Date: 04/03/2024 EXAM: US  Retroperitoneum Complete, Renal. 04/03/2024 08:51:48 AM TECHNIQUE: Real-time ultrasonography of the retroperitoneum renal was performed. COMPARISON: US  Renal 03/26/2024; 07/04/2023. CLINICAL HISTORY: Abdominal pain. FINDINGS: FINDINGS: RIGHT KIDNEY/URETER: Right kidney measures 10.8 x 5.8 x 5.3 cm. Normal cortical echogenicity. No hydronephrosis. No calculus. No mass. LEFT KIDNEY/URETER: Left kidney measures 7.5 x 3.9 x 2.8 cm. The left kidney is relatively atrophic and echogenic, but is suboptimally visualized. No hydronephrosis. No calculus. No mass. BLADDER: Unremarkable appearance of the bladder. IMPRESSION: 1. Relatively atrophic and echogenic left kidney, suboptimally visualized. Electronically signed by: Evalene Coho MD 04/03/2024 09:14 AM EST RP Workstation: [id number]   DG Abd 1 View Result Date: 04/02/2024 CLINICAL DATA:  [id number] Abdominal pain [id number] EXAM: ABDOMEN - 1 VIEW COMPARISON:  March 30, 2024 FINDINGS: Similar gaseous distension of the small bowel and colon.Percutaneous gastrostomy tube noted overlying the stomach.No pneumoperitoneum. No organomegaly or radiopaque calculi. No acute fracture or destructive lesion. Similar changes in the lung bases. IVC filter again noted apex to the right of L2.Temperature probe overlying the midline pelvis. Mild bilateral hip osteoarthritis. Multilevel  degenerative disc disease of the spine. IMPRESSION: Diffuse gaseous distention of the small bowel and colon, as can be seen in an adynamic ileus. Continued close radiographic follow-up recommended. Electronically Signed   By: Rogelia Myers M.D.   On: 04/02/2024 18:42   US  BIOPSY (KIDNEY) Result Date: 04/02/2024 INDICATION: 74 year old with acute on chronic kidney disease. Concern for vasculitis or autoimmune process. Left kidney is atrophic. EXAM: ULTRASOUND-GUIDED RANDOM RENAL BIOPSY MEDICATIONS: Moderate sedation ANESTHESIA/SEDATION: Moderate (conscious) sedation was employed during this procedure.  A total of Versed  1 mg and Fentanyl  50 mcg was administered intravenously by the radiology nurse. Total intra-service moderate Sedation Time: 15 minutes. The patient's level of consciousness and vital signs were monitored continuously by radiology nursing throughout the procedure under my direct supervision. FLUOROSCOPY TIME:  None COMPLICATIONS: None immediate. PROCEDURE: Informed written consent was obtained from the patient after a thorough discussion of the procedural risks, benefits and alternatives. All questions were addressed. A timeout was performed prior to the initiation of the procedure. Patient was placed prone. Right flank was evaluated with ultrasound. Right kidney was selected for biopsy. Right flank was prepped and draped in sterile fashion. Maximal barrier sterile technique was utilized including caps, mask, sterile gowns, sterile gloves, sterile drape, hand hygiene and skin antiseptic. Skin was anesthetized using 1% lidocaine . Small incision was made. Using ultrasound guidance, a 17 gauge coaxial needle was directed to the upper pole cortex. Two core biopsies were obtained from the upper pole cortex with an 18 gauge core device. Specimens placed in saline. Gel-Foam slurry was injected as the 17 gauge coaxial needle was retracted. Bandage placed over the puncture site. FINDINGS: Right kidney has  an unusual orientation and upper pole is most posterior and accessible for biopsy. Two adequate core specimens obtained from the right kidney upper pole. No immediate bleeding or hematoma formation. IMPRESSION: Successful ultrasound-guided right renal biopsy. Electronically Signed   By: [name] Balder M.D.   On: 04/02/2024 14:21     Subjective: No acute issues or events overnight denies nausea vomiting diarrhea constipation headache fevers chills or chest pain   Discharge Exam: Vitals:   04/29/24 0412 04/29/24 0757  BP: 132/61 (!) 126/59  Pulse: 74 82  Resp: 18 19  Temp: 98.4 F (36.9 C) 98.1 F (36.7 C)  SpO2: 99% 100%   Vitals:   04/28/24 1606 04/28/24 2032 04/29/24 0412 04/29/24 0757  BP: 121/78 134/63 132/61 (!) 126/59  Pulse: 80 69 74 82  Resp: 18 18 18 19   Temp: 98.3 F (36.8 C) 98.6 F (37 C) 98.4 F (36.9 C) 98.1 F (36.7 C)  TempSrc:      SpO2: 100% 98% 99% 100%  Weight:      Height:       General:  Pleasantly resting in bed, No acute distress.  Appears chronically ill, somewhat agitated per discussion of narcotic cessation Neck: Right IJ HD catheter noted Lungs:  Clear to auscultate bilaterally without rhonchi, wheeze, or rales. Heart:  Regular rate and rhythm.  Without murmurs, rubs, or gallops. Abdomen:  Soft, nontender, nondistended.  Prior PEG tube site clean dry intact. Extremities: Without cyanosis, clubbing, edema, or obvious deformity. Skin:  Warm and dry, no erythema. Psych: Normal mood, flat affect  The results of significant diagnostics from this hospitalization (including imaging, microbiology, ancillary and laboratory) are listed below for reference.     Microbiology: No results found for this or any previous visit (from the past 240 hours).   Labs: BNP (last 3 results) Recent Labs    06/27/23 1116 03/22/24 0913 03/25/24 0535  BNP 646.4* 2,404.1* 831.9*   Basic Metabolic Panel: Recent Labs  Lab 04/24/24 0415 04/25/24 0122 04/26/24 0409  04/27/24 0300 04/28/24 0425  NA 137 134* 136 133* 135  K 3.8 3.6 3.8 3.6 3.7  CL 99 97* 97* 96* 99  CO2 27 30 28 29 27   GLUCOSE 91 88 94 101* 121*  BUN 51* 29* 43* 26* 42*  CREATININE 3.33* 2.26* 3.08* 2.86* 3.60*  CALCIUM  8.5* 8.3* 8.2* 7.9* 8.2*   Liver Function Tests: Recent Labs  Lab 04/23/24 0322 04/24/24 0415 04/25/24 0122 04/27/24 0300  AST 93* 70* 78* 99*  ALT 166* 135* 128* 129*  ALKPHOS 1,549* 1,307* 1,380* 1,477*  BILITOT 1.2 1.2 1.1 1.1  PROT 5.5* 5.4* 5.4* 5.2*  ALBUMIN  2.1* 2.1* 2.2* 2.1*   No results for input(s): LIPASE, AMYLASE in the last 168 hours. No results for input(s): AMMONIA in the last 168 hours. CBC: Recent Labs  Lab 04/24/24 0415 04/26/24 0409 04/27/24 0300 04/28/24 0425  WBC 13.1* 12.3* 12.0* 10.7*  HGB 7.6* 7.7* 7.7* 7.8*  HCT 24.1* 24.9* 25.1* 25.1*  MCV 86.4 89.2 90.3 88.1  PLT 293 234 208 208   Cardiac Enzymes: No results for input(s): CKTOTAL, CKMB, CKMBINDEX, TROPONINI in the last 168 hours. BNP: Invalid input(s): POCBNP CBG: Recent Labs  Lab 04/28/24 1051 04/28/24 1609 04/28/24 2107 04/29/24 0858 04/29/24 1115  GLUCAP 137* 108* 106* 131* 126*   D-Dimer No results for input(s): DDIMER in the last 72 hours. Hgb A1c No results for input(s): HGBA1C in the last 72 hours. Lipid Profile No results for input(s): CHOL, HDL, LDLCALC, TRIG, CHOLHDL, LDLDIRECT in the last 72 hours. Thyroid  function studies No results for input(s): TSH, T4TOTAL, T3FREE, THYROIDAB in the last 72 hours.  Invalid input(s): FREET3 Anemia work up No results for input(s): VITAMINB12, FOLATE, FERRITIN, TIBC, IRON , RETICCTPCT in the last 72 hours. Urinalysis    Component Value Date/Time   COLORURINE YELLOW 03/27/2024 1122   APPEARANCEUR CLEAR 03/27/2024 1122   LABSPEC 1.025 03/27/2024 1122   PHURINE 5.5 03/27/2024 1122   GLUCOSEU NEGATIVE 03/27/2024 1122   HGBUR LARGE (A) 03/27/2024 1122    BILIRUBINUR SMALL (A) 03/27/2024 1122   KETONESUR NEGATIVE 03/27/2024 1122   PROTEINUR >300 (A) 03/27/2024 1122   UROBILINOGEN 0.2 08/31/2019 1136   NITRITE NEGATIVE 03/27/2024 1122   LEUKOCYTESUR TRACE (A) 03/27/2024 1122   Sepsis Labs Recent Labs  Lab 04/24/24 0415 04/26/24 0409 04/27/24 0300 04/28/24 0425  WBC 13.1* 12.3* 12.0* 10.7*   Microbiology No results found for this or any previous visit (from the past 240 hours).   Time coordinating discharge: Over 30 minutes  SIGNED:   Elsie JAYSON Montclair, DO [facility] 04/29/2024, 1:41 PM Pager   If 7PM-7AM, please contact night-coverage [email or web address]     [1]  Allergies Allergen Reactions   Hydralazine  Hcl     Concern for drug induced lupus   Norvasc  [Amlodipine ] Swelling and Other (See Comments)    Excessive BLE swelling Skin discoloration, blotching   Jardiance  [Empagliflozin ] Itching and Other (See Comments)    Patient mentioned penile swelling, pain   Morphine  And Codeine Itching    Opioid-induced pruritus

## 2024-04-29 NOTE — Progress Notes (Signed)
 Patient discharged to [facility], transported by [facility]. Patient's belongings sent with patient.

## 2024-04-29 NOTE — Progress Notes (Addendum)
 D/c orders noted. Contacted out-pt HD clinic [facility] [place] and informed of pt d/c and anticipated arrival to clinic on Wednesday. Requested renal NP send orders. Noted that pt received HD on day of discharge. No further support is needed.     Lavanda Deshanti Adcox Dialysis Navigator [phone]

## 2024-04-30 NOTE — Discharge Planning (Signed)
 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  He has completed rounds of antibiotics.  Mild interstitial fibrosis/tubular atrophy.  Cr looks like had a mild AKI in mid-October, peaking at 2.6. Then looks like resolved int he 1.7 rand in late October/ early Nov but then another AKI to 2.98 early Nov. That one looks like it resolved to 1.6-1.7 early Dec but now Cr 1.93 (12.4.25)-->2.16 (03/22/24) --> 2.52 03/24/24 --> 2.89 03/25/24 --> 3.71 03/26/24, prompting evaluation.   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  VDRA: Hectorol: Per protocol Venofer : Per protocol Mircera: Per protocol S/p PRBCs on 10/21, 12/12, 12/22, and 12/28 (total 4 units)  Discharge labs: Hgb: 8.1  K+: 4.1  Ca: 8.2  Phos: 4.9  Alb: 2.2  Charmaine Piety, NP

## 2024-04-30 NOTE — Progress Notes (Signed)
 Late note entry 1/13 0854am  Contacted by clinic [facility] [place] reagrding orders. Clinic requesting, NP notified at this time.  Michaell Grider Dialysis Naviagtor [phone]
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
## 2024-05-06 ENCOUNTER — Telehealth (HOSPITAL_COMMUNITY): Payer: Self-pay

## 2024-05-06 NOTE — Telephone Encounter (Signed)
 Called to confirm/remind patient of their appointment [facility] on 05/07/[facility].   Appointment:   [] Confirmed  [x] Left mess   [] No answer/No voice mail  [] VM Full/unable to leave message  [] Phone not in service  And to bring in all medications and/or complete list.

## 2024-05-07 ENCOUNTER — Ambulatory Visit (HOSPITAL_COMMUNITY)

## 2024-05-08 ENCOUNTER — Ambulatory Visit: Admitting: Student in an Organized Health Care Education/Training Program

## 2024-05-09 ENCOUNTER — Ambulatory Visit
Attending: Thoracic Surgery (Cardiothoracic Vascular Surgery) | Admitting: Thoracic Surgery (Cardiothoracic Vascular Surgery)

## 2024-05-09 VITALS — BP 113/52 | HR 89 | Resp 18 | Ht 73.0 in | Wt 145.0 lb

## 2024-05-09 DIAGNOSIS — K223 Perforation of esophagus: Secondary | ICD-10-CM

## 2024-05-09 NOTE — Progress Notes (Signed)
" ° °   °  [address] [place] [address]             [phone]       [name] Medical Record #[id number] Date of Birth: 08/08/1950  Referring: Seabron Lenis, MD Primary Care: Seabron Lenis, MD Primary Cardiologist:Jonathan Court, MD  Reason for visit:   follow-up  History of Present Illness:     Billy [name] present for follow-up after hospital discharge.  He remains in rehab  Physical Exam: BP (!) 113/52   Pulse 89   Resp 18   Ht 6' 1 (1.854 m)   Wt 145 lb (65.8 kg)   SpO2 96% Comment: RA  BMI 19.13 kg/m   Alert NAD Abdomen, ND PEG site healed      Assessment / Plan:   74 y.o. male s/p esophageal stent placement and removal, and PEG tube placement and removal.  Doing well.  Follow-up as needed.   Billy [name] 05/09/2024 4:23 PM       "

## 2024-06-11 ENCOUNTER — Ambulatory Visit: Admitting: Student
